# Patient Record
Sex: Female | Born: 1955
Health system: Southern US, Community
[De-identification: ages and names within clinical notes are randomized; demographics above are authoritative.]

## PROBLEM LIST (undated history)

## (undated) DIAGNOSIS — Z972 Presence of dental prosthetic device (complete) (partial): Secondary | ICD-10-CM

## (undated) DIAGNOSIS — F329 Major depressive disorder, single episode, unspecified: Secondary | ICD-10-CM

## (undated) DIAGNOSIS — M1711 Unilateral primary osteoarthritis, right knee: Secondary | ICD-10-CM

## (undated) DIAGNOSIS — F419 Anxiety disorder, unspecified: Secondary | ICD-10-CM

## (undated) DIAGNOSIS — G2581 Restless legs syndrome: Secondary | ICD-10-CM

## (undated) DIAGNOSIS — E785 Hyperlipidemia, unspecified: Secondary | ICD-10-CM

## (undated) DIAGNOSIS — I4891 Unspecified atrial fibrillation: Secondary | ICD-10-CM

## (undated) DIAGNOSIS — I2510 Atherosclerotic heart disease of native coronary artery without angina pectoris: Secondary | ICD-10-CM

## (undated) DIAGNOSIS — I5189 Other ill-defined heart diseases: Secondary | ICD-10-CM

## (undated) DIAGNOSIS — Z9221 Personal history of antineoplastic chemotherapy: Secondary | ICD-10-CM

## (undated) DIAGNOSIS — C320 Malignant neoplasm of glottis: Secondary | ICD-10-CM

## (undated) DIAGNOSIS — R05 Cough: Secondary | ICD-10-CM

## (undated) DIAGNOSIS — C140 Malignant neoplasm of pharynx, unspecified: Secondary | ICD-10-CM

## (undated) DIAGNOSIS — R0600 Dyspnea, unspecified: Secondary | ICD-10-CM

## (undated) DIAGNOSIS — C4490 Unspecified malignant neoplasm of skin, unspecified: Secondary | ICD-10-CM

## (undated) DIAGNOSIS — M1990 Unspecified osteoarthritis, unspecified site: Secondary | ICD-10-CM

## (undated) DIAGNOSIS — J449 Chronic obstructive pulmonary disease, unspecified: Secondary | ICD-10-CM

## (undated) DIAGNOSIS — Z7982 Long term (current) use of aspirin: Secondary | ICD-10-CM

## (undated) DIAGNOSIS — K08109 Complete loss of teeth, unspecified cause, unspecified class: Secondary | ICD-10-CM

## (undated) DIAGNOSIS — Z923 Personal history of irradiation: Secondary | ICD-10-CM

## (undated) DIAGNOSIS — F32A Depression, unspecified: Secondary | ICD-10-CM

## (undated) DIAGNOSIS — E538 Deficiency of other specified B group vitamins: Secondary | ICD-10-CM

## (undated) DIAGNOSIS — Z8719 Personal history of other diseases of the digestive system: Secondary | ICD-10-CM

## (undated) DIAGNOSIS — I700 Atherosclerosis of aorta: Secondary | ICD-10-CM

## (undated) DIAGNOSIS — C50919 Malignant neoplasm of unspecified site of unspecified female breast: Secondary | ICD-10-CM

## (undated) DIAGNOSIS — K219 Gastro-esophageal reflux disease without esophagitis: Secondary | ICD-10-CM

## (undated) DIAGNOSIS — D126 Benign neoplasm of colon, unspecified: Secondary | ICD-10-CM

## (undated) HISTORY — DX: Unspecified malignant neoplasm of skin, unspecified: C44.90

## (undated) HISTORY — DX: Malignant neoplasm of pharynx, unspecified: C14.0

## (undated) HISTORY — DX: Malignant neoplasm of unspecified site of unspecified female breast: C50.919

## (undated) HISTORY — PX: KNEE SURGERY: SHX244

## (undated) HISTORY — PX: PORT-A-CATH REMOVAL: SHX5289

## (undated) HISTORY — PX: SKIN CANCER EXCISION: SHX779

## (undated) HISTORY — DX: Malignant neoplasm of glottis: C32.0

## (undated) HISTORY — PX: TUBAL LIGATION: SHX77

## (undated) HISTORY — PX: ESOPHAGUS SURGERY: SHX626

---
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
## 1995-05-03 DIAGNOSIS — C50919 Malignant neoplasm of unspecified site of unspecified female breast: Secondary | ICD-10-CM

## 1995-05-03 HISTORY — PX: BREAST LUMPECTOMY: SHX2

## 1995-05-03 HISTORY — DX: Malignant neoplasm of unspecified site of unspecified female breast: C50.919

## 1995-05-03 HISTORY — PX: BREAST BIOPSY: SHX20

## 1996-05-02 DIAGNOSIS — C140 Malignant neoplasm of pharynx, unspecified: Secondary | ICD-10-CM

## 1996-05-02 HISTORY — PX: THROAT SURGERY: SHX803

## 1996-05-02 HISTORY — DX: Malignant neoplasm of pharynx, unspecified: C14.0

## 2004-09-16 ENCOUNTER — Ambulatory Visit: Payer: Self-pay | Admitting: Oncology

## 2004-09-30 ENCOUNTER — Ambulatory Visit: Payer: Self-pay | Admitting: Oncology

## 2005-01-10 ENCOUNTER — Other Ambulatory Visit: Payer: Self-pay

## 2005-01-10 ENCOUNTER — Emergency Department: Payer: Self-pay | Admitting: Emergency Medicine

## 2005-10-05 ENCOUNTER — Ambulatory Visit: Payer: Self-pay | Admitting: Oncology

## 2005-10-30 ENCOUNTER — Ambulatory Visit: Payer: Self-pay | Admitting: Oncology

## 2005-11-30 ENCOUNTER — Ambulatory Visit: Payer: Self-pay | Admitting: Oncology

## 2006-10-01 ENCOUNTER — Ambulatory Visit: Payer: Self-pay | Admitting: Oncology

## 2006-10-05 ENCOUNTER — Ambulatory Visit: Payer: Self-pay | Admitting: Oncology

## 2006-10-31 ENCOUNTER — Ambulatory Visit: Payer: Self-pay | Admitting: Oncology

## 2006-11-15 ENCOUNTER — Ambulatory Visit: Payer: Self-pay | Admitting: Oncology

## 2007-06-14 DIAGNOSIS — Z720 Tobacco use: Secondary | ICD-10-CM | POA: Insufficient documentation

## 2007-10-31 ENCOUNTER — Ambulatory Visit: Payer: Self-pay | Admitting: Oncology

## 2007-11-29 ENCOUNTER — Ambulatory Visit: Payer: Self-pay | Admitting: Oncology

## 2007-12-10 ENCOUNTER — Ambulatory Visit: Payer: Self-pay | Admitting: Oncology

## 2008-03-13 DIAGNOSIS — D492 Neoplasm of unspecified behavior of bone, soft tissue, and skin: Secondary | ICD-10-CM | POA: Insufficient documentation

## 2008-10-23 DIAGNOSIS — L03019 Cellulitis of unspecified finger: Secondary | ICD-10-CM | POA: Insufficient documentation

## 2008-10-30 ENCOUNTER — Ambulatory Visit: Payer: Self-pay | Admitting: Oncology

## 2008-11-24 ENCOUNTER — Ambulatory Visit: Payer: Self-pay | Admitting: Oncology

## 2008-11-30 ENCOUNTER — Ambulatory Visit: Payer: Self-pay | Admitting: Oncology

## 2008-12-10 ENCOUNTER — Ambulatory Visit: Payer: Self-pay | Admitting: Oncology

## 2009-10-30 ENCOUNTER — Ambulatory Visit: Payer: Self-pay | Admitting: Oncology

## 2009-11-26 ENCOUNTER — Ambulatory Visit: Payer: Self-pay | Admitting: Oncology

## 2009-12-22 ENCOUNTER — Ambulatory Visit: Payer: Self-pay | Admitting: Oncology

## 2010-04-20 ENCOUNTER — Inpatient Hospital Stay: Payer: Self-pay | Admitting: Orthopedic Surgery

## 2010-08-31 ENCOUNTER — Ambulatory Visit: Payer: Self-pay | Admitting: Orthopedic Surgery

## 2010-11-29 ENCOUNTER — Ambulatory Visit: Payer: Self-pay | Admitting: Oncology

## 2010-12-01 ENCOUNTER — Ambulatory Visit: Payer: Self-pay | Admitting: Oncology

## 2011-01-01 ENCOUNTER — Ambulatory Visit: Payer: Self-pay | Admitting: Oncology

## 2011-11-28 ENCOUNTER — Ambulatory Visit: Payer: Self-pay | Admitting: Oncology

## 2011-11-28 LAB — COMPREHENSIVE METABOLIC PANEL
Alkaline Phosphatase: 99 U/L (ref 50–136)
Anion Gap: 7 (ref 7–16)
Bilirubin,Total: 0.4 mg/dL (ref 0.2–1.0)
Calcium, Total: 9.2 mg/dL (ref 8.5–10.1)
Chloride: 102 mmol/L (ref 98–107)
Co2: 31 mmol/L (ref 21–32)
Creatinine: 0.82 mg/dL (ref 0.60–1.30)
EGFR (Non-African Amer.): >60
Osmolality: 278 (ref 275–301)
Potassium: 4.4 mmol/L (ref 3.5–5.1)
Sodium: 140 mmol/L (ref 136–145)

## 2011-11-28 LAB — CBC CANCER CENTER
Basophil #: 0 x10 3/mm (ref 0.0–0.1)
HCT: 46.3 % (ref 35.0–47.0)
Lymphocyte #: 1 x10 3/mm (ref 1.0–3.6)
MCH: 34.5 pg — ABNORMAL HIGH (ref 26.0–34.0)
MCHC: 33.8 g/dL (ref 32.0–36.0)
MCV: 102 fL — ABNORMAL HIGH (ref 80–100)
Monocyte #: 0.5 x10 3/mm (ref 0.2–0.9)
Monocyte %: 8.1 %
Neutrophil #: 4.1 x10 3/mm (ref 1.4–6.5)
Platelet: 186 x10 3/mm (ref 150–440)
RBC: 4.54 10*6/uL (ref 3.80–5.20)
RDW: 12.2 % (ref 11.5–14.5)
WBC: 5.7 x10 3/mm (ref 3.6–11.0)

## 2011-12-01 ENCOUNTER — Ambulatory Visit: Payer: Self-pay | Admitting: Oncology

## 2012-10-30 ENCOUNTER — Ambulatory Visit: Payer: Self-pay | Admitting: Oncology

## 2012-11-26 LAB — COMPREHENSIVE METABOLIC PANEL
Albumin: 3.5 g/dL (ref 3.4–5.0)
Alkaline Phosphatase: 87 U/L (ref 50–136)
Bilirubin,Total: 0.4 mg/dL (ref 0.2–1.0)
Calcium, Total: 9 mg/dL (ref 8.5–10.1)
Chloride: 106 mmol/L (ref 98–107)
Creatinine: 0.85 mg/dL (ref 0.60–1.30)
EGFR (African American): >60
Osmolality: 280 (ref 275–301)
Potassium: 4.1 mmol/L (ref 3.5–5.1)
SGOT(AST): 25 U/L (ref 15–37)
SGPT (ALT): 21 U/L (ref 12–78)
Total Protein: 6.4 g/dL (ref 6.4–8.2)

## 2012-11-26 LAB — CBC CANCER CENTER
Basophil #: 0 x10 3/mm (ref 0.0–0.1)
Eosinophil #: 0 x10 3/mm (ref 0.0–0.7)
HGB: 15.1 g/dL (ref 12.0–16.0)
Lymphocyte %: 14.3 %
MCH: 35.2 pg — ABNORMAL HIGH (ref 26.0–34.0)
MCHC: 34.7 g/dL (ref 32.0–36.0)
MCV: 102 fL — ABNORMAL HIGH (ref 80–100)
Monocyte #: 0.4 x10 3/mm (ref 0.2–0.9)
Monocyte %: 5.9 %
Neutrophil #: 5.6 x10 3/mm (ref 1.4–6.5)
Platelet: 182 x10 3/mm (ref 150–440)
RBC: 4.29 10*6/uL (ref 3.80–5.20)
RDW: 11.3 % — ABNORMAL LOW (ref 11.5–14.5)
WBC: 7.2 x10 3/mm (ref 3.6–11.0)

## 2012-11-30 ENCOUNTER — Ambulatory Visit: Payer: Self-pay | Admitting: Oncology

## 2013-03-06 DIAGNOSIS — B0052 Herpesviral keratitis: Secondary | ICD-10-CM | POA: Insufficient documentation

## 2013-03-06 DIAGNOSIS — H2510 Age-related nuclear cataract, unspecified eye: Secondary | ICD-10-CM | POA: Insufficient documentation

## 2013-05-02 DIAGNOSIS — C320 Malignant neoplasm of glottis: Secondary | ICD-10-CM

## 2013-05-02 HISTORY — DX: Malignant neoplasm of glottis: C32.0

## 2013-05-02 HISTORY — PX: COLONOSCOPY: SHX174

## 2013-06-12 ENCOUNTER — Emergency Department: Payer: Self-pay | Admitting: Internal Medicine

## 2013-12-31 ENCOUNTER — Ambulatory Visit: Payer: Self-pay | Admitting: Oncology

## 2014-02-03 ENCOUNTER — Ambulatory Visit: Payer: Self-pay | Admitting: Oncology

## 2014-03-02 ENCOUNTER — Ambulatory Visit: Payer: Self-pay | Admitting: Oncology

## 2014-04-07 ENCOUNTER — Ambulatory Visit: Payer: Self-pay | Admitting: Gastroenterology

## 2014-04-07 LAB — HM COLONOSCOPY

## 2014-04-08 LAB — CBC WITH DIFFERENTIAL/PLATELET
Basophil #: 0 x10 3/mm 3
Basophil %: 0.2 %
Eosinophil #: 0 x10 3/mm 3
Eosinophil %: 0.2 %
HCT: 40.6 %
HGB: 13.5 g/dL
Lymphocyte %: 10.7 %
Lymphs Abs: 0.8 x10 3/mm 3 — ABNORMAL LOW
MCH: 33.2 pg
MCHC: 33.4 g/dL
MCV: 100 fL
Monocyte #: 0.7 "x10 3/mm "
Monocyte %: 8.8 %
Neutrophil #: 6.1 x10 3/mm 3
Neutrophil %: 80.1 %
Platelet: 169 x10 3/mm 3
RBC: 4.08 X10 6/mm 3
RDW: 12.7 %
WBC: 7.6 x10 3/mm 3

## 2014-04-08 LAB — BASIC METABOLIC PANEL WITH GFR
Anion Gap: 4 — ABNORMAL LOW
BUN: 6 mg/dL — ABNORMAL LOW
Calcium, Total: 7.5 mg/dL — ABNORMAL LOW
Chloride: 113 mmol/L — ABNORMAL HIGH
Co2: 26 mmol/L
Creatinine: 0.67 mg/dL
EGFR (African American): >60
EGFR (Non-African Amer.): >60
Glucose: 84 mg/dL
Osmolality: 282
Potassium: 3.5 mmol/L
Sodium: 143 mmol/L

## 2014-04-16 ENCOUNTER — Ambulatory Visit: Payer: Self-pay | Admitting: Gastroenterology

## 2014-04-21 ENCOUNTER — Ambulatory Visit: Payer: Self-pay | Admitting: Oncology

## 2014-04-29 ENCOUNTER — Ambulatory Visit: Payer: Self-pay | Admitting: Gastroenterology

## 2014-05-01 ENCOUNTER — Ambulatory Visit: Payer: Self-pay | Admitting: Oncology

## 2014-05-02 ENCOUNTER — Ambulatory Visit: Payer: Self-pay | Admitting: Oncology

## 2014-06-02 ENCOUNTER — Ambulatory Visit: Payer: Self-pay | Admitting: Oncology

## 2014-08-15 ENCOUNTER — Other Ambulatory Visit: Payer: Self-pay | Admitting: Oncology

## 2014-08-15 DIAGNOSIS — J984 Other disorders of lung: Secondary | ICD-10-CM

## 2014-08-15 DIAGNOSIS — Z853 Personal history of malignant neoplasm of breast: Secondary | ICD-10-CM

## 2014-08-23 NOTE — Discharge Summary (Signed)
PATIENT NAME:  Denise Watts, Denise Watts I MR#:  [id number] DATE OF BIRTH:  01/12/1956  PRIMARY CARE PHYSICIAN:  Nonlocal.  DISCHARGE DIAGNOSES: 1.  Severe odynophagia, status post an esophagogastroduodenoscopy with esophagitis.  2.  Accelerated hypertension.  3.  A 1.4 centimeter left lower lobe pulmonary nodule.  4.  Gastroesophageal reflux disease. 5.  Vocal cord cancer. 6.  Breast cancer.   CONDITION:  Stable.   CODE STATUS:  Full code.   HOME MEDICATIONS: Please refer to the medication reconciliation list. Protonix 40 mg p.o. b.i.d., Percocet 325 mg-5 mg p.o. every 6 hours p.r.n. for 5 days.  DIET:  Full liquid diet, then advance to regular diet if tolerated.   FOLLOWUP CARE: With PCP and Dr. Candace Cruise within 1-2 weeks. Followup chest CT within 3-6 months for pulmonary nodule.   REASON FOR ADMISSION: Difficulty swallowing and painful.   HOSPITAL COURSE:  1.  The patient is a 59 year old Caucasian female with a history of vocal cord cancer, breast cancer, underwent EGD and colonoscopy yesterday by Dr. Candace Cruise. The patient developed dysphagia and pain after endoscopy. So, the patient admitted. The patient was placed for observation yesterday. The patient still has throat pain and substernal pain.  Dr. Candace Cruise suggested a CAT scan of the neck and chest to rule out perforation. CAT scan of neck and chest did not show any perforation, but showed some esophagitis. Dr. Candace Cruise suggested to start clear liquid and PPI b.i.d. with pain control. The patient tolerated clear liquids just now.  2.  Accelerated hypertension. The patient has no history of hypertension, but blood pressure was high at 181/111 after endoscopy yesterday, but the patient's blood pressure improved to normal range. No need of hypertension medication.  3.  Per CAT scan of chest, the patient has a pulmonary nodule 1.4 cm on the left lower lobe. Patient needs a followup CAT scan of chest within 3-6 months.   The patient is clinically stable. She is going to be  discharged to home today. I discussed the patient's discharge plan with the patient, the patient's husband, nurse, case manager, and Dr. Candace Cruise.   TIME SPENT: About 67 minutes.    ____________________________ Demetrios Loll, MD qc:LT D: 04/08/2014 15:03:33 ET T: 04/08/2014 20:54:54 ET JOB#: [id number]  cc: Demetrios Loll, MD, <Dictator> Demetrios Loll MD ELECTRONICALLY SIGNED 04/09/2014 15:20

## 2014-08-23 NOTE — Consult Note (Signed)
Pt still with sore throat. CT done earlier. Just got preliminary report. Some nonspecific inflammation of esophagus. Pulmonary nodule seen, which needs to be followed closely. No evidence of perforation, etc. Would start patient on clears. Make sure patient is on PPI bid for now.  Pain meds +/or GI cocktail may help with pain control Pt can f/u with me in office later. Thanks.  Electronic Signatures: Verdie Shire (MD)  (Signed on 08-Dec-15 12:34)  Authored  Last Updated: 08-Dec-15 12:34 by Verdie Shire (MD)

## 2014-08-23 NOTE — H&P (Signed)
PATIENT NAME:  Denise Watts, ECKFORD I MR#:  [id number] DATE OF BIRTH:  01-Apr-1956  DATE OF ADMISSION:  04/07/2014  PRIMARY CARE PHYSICIAN/ONCOLOGY:  Dr. Forest Gleason.   REQUESTING PHYSICIAN: Dr. Verdie Shire.   CHIEF COMPLAINT:  Difficulty swallowing, also painful.    HISTORY OF PRESENT ILLNESS:  The patient is a 59 year old female with known history of breast cancer, vocal cord cancer, undergoing radiation at the [facility], was undergoing EGD and colonoscopy by Dr. Candace Cruise this morning and she was having difficulty swallowing. Her esophagus was dilated by Dr. Candace Cruise. Post procedure she started having a fair amount of severe throat pain and was unable to swallow her sputum, due to which we have been asked to monitor her in the hospital. She is reporting about 8 out of 10 pain in her throat, unable to swallow sputum or saliva. She is also complaining of some mid sternal pain. Denies any epigastric pain and also having some headache. She reports her pain getting worse since her procedure.   PAST MEDICAL HISTORY:  1.  GERD.  2.  Basal cell skin cancer.  3.  DJD.   4.  History of migraine.  5.  Vocal cord cancer.  6.  Breast cancer.   PAST SURGICAL HISTORY:  1.  Esophageal surgery.   2.  Right breast lumpectomy.  3.  Throat surgery.  4.  Tubal ligation.  5.  ORIF of right tibia.   ALLERGIES: No known drug allergies.   SOCIAL HISTORY: No smoking. Occasional alcohol. No IV drugs of abuse.   FAMILY HISTORY: Positive for lung cancer in her sister.   MEDICATIONS AT HOME:  1.  Nexium 40 mg p.o. daily.  2.  Aleve 220 mg p.o. daily as needed.   REVIEW OF SYSTEMS:    CONSTITUTIONAL: No fever, fatigue, weakness.  EYES: No blurred or double vision.  EARS, NOSE, AND THROAT: No tinnitus or ear pain.  RESPIRATORY: No cough, wheezing, hemoptysis.  CARDIOVASCULAR: No chest pain, orthopnea, edema.  GASTROINTESTINAL: No nausea, vomiting, diarrhea. She does have severe odynophagia and some midsternal heaviness.   GENITOURINARY: No dysuria or hematuria.  ENDOCRINE: No polyuria or nocturia.  HEMATOLOGY: No anemia or easy bruising. She does have a history of vocal cord cancer along with breast cancer.  MUSCULOSKELETAL: No joint effusion or tenderness.  NEUROLOGIC: No tingling, numbness, weakness.  PSYCHIATRIC: No history of anxiety or depression.  SKIN: No obvious rash.   PHYSICAL EXAMINATION:  VITAL SIGNS: Temperature 97.6, heart rate 73 per minute, respirations 18 per minute, blood pressure 181/111. She is saturating 99% on room air.  GENERAL: The patient is a 59 year old female lying in the bed comfortably without any acute distress.  EYES: Pupils equal, round, reactive to light, there is no scleral icterus. Extraocular muscles intact.  HEENT: Head atraumatic, normocephalic. Oropharynx and nasopharynx clear. Her oral cavity exam looks fairly normal, she does have erythema in the back of her throat.  NECK: Supple.  No jugular venous distention.  No thyroid enlargement or tenderness. LUNGS: Clear to auscultation bilaterally. No wheezing, rales, rhonchi, or crepitation.  CARDIOVASCULAR: S1, S2 normal. No murmurs, rubs, or gallop.  ABDOMEN: Soft, nontender, nondistended. Bowel sounds present. No organomegaly or mass.  EXTREMITIES: No pedal edema, cyanosis, clubbing.  NEUROLOGIC: Cranial nerves II through XII are intact. Muscle strength 5 out of 5 in extremities. Sensation intact.  PSYCHIATRIC: The patient is alert and oriented x 3.  SKIN: No obvious rash, lesion, or ulcer.  MUSCULOSKELETAL: No joint effusion or  tenderness.   LABORATORY PANEL: None.   IMPRESSION AND PLAN:  1.  Severe odynphagia status post esophagogastroduodenoscopy, this could be procedure related complication. We will closely monitor her, keep her n.p.o. for now, start on IV hydration, consult GI. We will provide morphine for pain control as she is not able to keep anything down by mouth.  2.  Accelerated hypertension with a blood  pressure 181/111. We will start her on IV hydralazine for now and monitor her blood pressure closely. This is likely due to her uncontrolled pain. 3.  Gastroesophageal reflux disease. We will start IV Protonix at this time.  4.  Code status: Full code.   TOTAL TIME TAKING CARE OF THIS PATIENT: 40 minutes.   Dr. Candace Cruise from GI has been consulted, he will be seeing her later today.      ____________________________ Tapanga Ottaway S. Manuella Ghazi, MD vss:bu D: 04/07/2014 14:42:16 ET T: 04/07/2014 15:04:55 ET JOB#: [id number]  cc: Wiley Magan S. Manuella Ghazi, MD, <Dictator> Martie Lee. Oliva Bustard, MD Lupita Dawn. Candace Cruise, MD Remer Macho MD ELECTRONICALLY SIGNED 04/11/2014 13:08

## 2014-08-23 NOTE — Consult Note (Signed)
Pt underwent EGD earlier with esophageal dilation with 54 Fr Maloney dilator. Started having fair amount of throat pain afterwards. I know there was some bleeding and resistance with passage of dilator. Suspect some stenosis at cervical esophageal area from prior XRT to vocal cord area for hx of vocal cord cancer in the past. Pt given fentanyl without significant relief. Pt looks stable otherwise. Discussed with hospitalist for observation. Keep patient NPO with IVF. If pain persists, then order CT of neck area or barium swallow to check for possible perforation from dilation and get Dr. Genevive Bi involved.    Electronic Signatures: Verdie Shire (MD) (Signed on 07-Dec-15 12:48)  Authored   Last Updated: 07-Dec-15 16:22 by Verdie Shire (MD)

## 2014-11-05 ENCOUNTER — Other Ambulatory Visit: Payer: Self-pay | Admitting: *Deleted

## 2014-11-05 DIAGNOSIS — R911 Solitary pulmonary nodule: Secondary | ICD-10-CM

## 2014-11-06 ENCOUNTER — Ambulatory Visit: Payer: No Typology Code available for payment source

## 2014-11-06 ENCOUNTER — Other Ambulatory Visit: Payer: Self-pay | Admitting: Family Medicine

## 2014-11-07 ENCOUNTER — Ambulatory Visit
Admission: RE | Admit: 2014-11-07 | Discharge: 2014-11-07 | Disposition: A | Discharge status: Home / Self Care | Payer: No Typology Code available for payment source | Source: Ambulatory Visit | Attending: Oncology | Admitting: Oncology

## 2014-11-07 ENCOUNTER — Other Ambulatory Visit: Payer: Self-pay | Admitting: *Deleted

## 2014-11-07 DIAGNOSIS — R911 Solitary pulmonary nodule: Secondary | ICD-10-CM | POA: Diagnosis present

## 2014-11-07 DIAGNOSIS — Z85819 Personal history of malignant neoplasm of unspecified site of lip, oral cavity, and pharynx: Secondary | ICD-10-CM | POA: Insufficient documentation

## 2014-11-07 DIAGNOSIS — Z923 Personal history of irradiation: Secondary | ICD-10-CM | POA: Diagnosis not present

## 2014-11-07 DIAGNOSIS — I2510 Atherosclerotic heart disease of native coronary artery without angina pectoris: Secondary | ICD-10-CM | POA: Diagnosis not present

## 2014-11-07 DIAGNOSIS — C50919 Malignant neoplasm of unspecified site of unspecified female breast: Secondary | ICD-10-CM

## 2014-11-07 DIAGNOSIS — Z853 Personal history of malignant neoplasm of breast: Secondary | ICD-10-CM | POA: Diagnosis not present

## 2014-11-10 ENCOUNTER — Inpatient Hospital Stay: Payer: PRIVATE HEALTH INSURANCE | Attending: Oncology

## 2014-11-10 ENCOUNTER — Inpatient Hospital Stay (HOSPITAL_BASED_OUTPATIENT_CLINIC_OR_DEPARTMENT_OTHER): Payer: PRIVATE HEALTH INSURANCE | Admitting: Oncology

## 2014-11-10 ENCOUNTER — Encounter: Payer: Self-pay | Admitting: Oncology

## 2014-11-10 VITALS — BP 131/80 | HR 75 | Temp 98.8°F | Resp 18 | Ht 61.0 in | Wt 146.4 lb

## 2014-11-10 DIAGNOSIS — Z85828 Personal history of other malignant neoplasm of skin: Secondary | ICD-10-CM | POA: Diagnosis not present

## 2014-11-10 DIAGNOSIS — D709 Neutropenia, unspecified: Secondary | ICD-10-CM | POA: Insufficient documentation

## 2014-11-10 DIAGNOSIS — Z87891 Personal history of nicotine dependence: Secondary | ICD-10-CM | POA: Insufficient documentation

## 2014-11-10 DIAGNOSIS — R911 Solitary pulmonary nodule: Secondary | ICD-10-CM | POA: Insufficient documentation

## 2014-11-10 DIAGNOSIS — Z853 Personal history of malignant neoplasm of breast: Secondary | ICD-10-CM | POA: Insufficient documentation

## 2014-11-10 DIAGNOSIS — C320 Malignant neoplasm of glottis: Secondary | ICD-10-CM | POA: Insufficient documentation

## 2014-11-10 DIAGNOSIS — I2510 Atherosclerotic heart disease of native coronary artery without angina pectoris: Secondary | ICD-10-CM | POA: Insufficient documentation

## 2014-11-10 DIAGNOSIS — Z79899 Other long term (current) drug therapy: Secondary | ICD-10-CM | POA: Insufficient documentation

## 2014-11-10 DIAGNOSIS — Z923 Personal history of irradiation: Secondary | ICD-10-CM | POA: Diagnosis not present

## 2014-11-10 DIAGNOSIS — C50919 Malignant neoplasm of unspecified site of unspecified female breast: Secondary | ICD-10-CM

## 2014-11-10 HISTORY — DX: Malignant neoplasm of glottis: C32.0

## 2014-11-10 LAB — CBC WITH DIFFERENTIAL/PLATELET
BASOS PCT: 1 %
Basophils Absolute: 0 10*3/uL (ref 0–0.1)
EOS PCT: 3 %
Eosinophils Absolute: 0.1 10*3/uL (ref 0–0.7)
HEMATOCRIT: 43.9 % (ref 35.0–47.0)
Hemoglobin: 14.4 g/dL (ref 12.0–16.0)
LYMPHS ABS: 0.4 10*3/uL — AB (ref 1.0–3.6)
LYMPHS PCT: 20 %
MCH: 32.8 pg (ref 26.0–34.0)
MCHC: 32.9 g/dL (ref 32.0–36.0)
MCV: 99.6 fL (ref 80.0–100.0)
Monocytes Absolute: 0.2 10*3/uL (ref 0.2–0.9)
Monocytes Relative: 9 %
Neutro Abs: 1.4 10*3/uL (ref 1.4–6.5)
Neutrophils Relative %: 67 %
Platelets: 185 10*3/uL (ref 150–440)
RBC: 4.4 MIL/uL (ref 3.80–5.20)
RDW: 12.4 % (ref 11.5–14.5)
WBC: 2.1 10*3/uL — ABNORMAL LOW (ref 3.6–11.0)

## 2014-11-10 LAB — COMPREHENSIVE METABOLIC PANEL
ALBUMIN: 3.9 g/dL (ref 3.5–5.0)
ALT: 19 U/L (ref 14–54)
ANION GAP: 7 (ref 5–15)
AST: 30 U/L (ref 15–41)
Alkaline Phosphatase: 71 U/L (ref 38–126)
BUN: 12 mg/dL (ref 6–20)
CO2: 25 mmol/L (ref 22–32)
Calcium: 8.3 mg/dL — ABNORMAL LOW (ref 8.9–10.3)
Chloride: 103 mmol/L (ref 101–111)
Creatinine, Ser: 0.88 mg/dL (ref 0.44–1.00)
GFR calc Af Amer: >60 mL/min (ref >60)
GFR calc non Af Amer: >60 mL/min (ref >60)
Glucose, Bld: 102 mg/dL — ABNORMAL HIGH (ref 65–99)
POTASSIUM: 3.7 mmol/L (ref 3.5–5.1)
Sodium: 135 mmol/L (ref 135–145)
Total Bilirubin: 0.5 mg/dL (ref 0.3–1.2)
Total Protein: 6.4 g/dL — ABNORMAL LOW (ref 6.5–8.1)

## 2014-11-10 NOTE — Progress Notes (Signed)
[place] @ [facility] [phone]  Fax:([phone] [place]: March 01, 1956  MR#: [id number]  DJM#:[id number]  Patient Care Team: Margo Common, PA as PCP - General (Physician Assistant)  CHIEF COMPLAINT:  Chief Complaint  Patient presents with  . Follow-up    Pulmonary nodule    Oncology History   1.carcinoma of breast, T1, N0, M0 tumor diagnosis.  In January of 1997 2.  Carcinoma of the vocal cord T1, N0, M0 tumor.  Status postradiation therapy 3,abnormal CT scan of the chest (December, 2015) 4.  Repeat CT scan of chest shows stable nodule (July, 2016) 5.  Neutropenia with normal hemoglobin and normal platelet count (July, 2016)     Cancer of vocal cord   11/10/2014 Initial Diagnosis Cancer of vocal cord     INTERVAL HISTORY:  59 year old lady with a previous history of carcinoma breast as well as carcinoma focal cord had pulmonary nodule detected in CT scan of 2015 had a repeat CT scan.  No chills.  No fever.  No cough or shortness of breath.  Patient has stopped smoking. NO hemoptysis or chest pain Getting regular mammogram done REVIEW OF SYSTEMS:   GENERAL:  Feels good.  Active.  No fevers, sweats or weight loss. PERFORMANCE STATUS (ECOG):  0 HEENT:  No visual changes, runny nose, sore throat, mouth sores or tenderness. Lungs: No shortness of breath or cough.  No hemoptysis. Cardiac:  No chest pain, palpitations, orthopnea, or PND. GI:  No nausea, vomiting, diarrhea, constipation, melena or hematochezia. GU:  No urgency, frequency, dysuria, or hematuria. Musculoskeletal:  No back pain.  No joint pain.  No muscle tenderness. Extremities:  No pain or swelling. Skin:  No rashes or skin changes. Neuro:  No headache, numbness or weakness, balance or coordination issues. Endocrine:  No diabetes, thyroid issues, hot flashes or night sweats. Psych:  No mood changes, depression or anxiety. Pain:  No focal pain. Review of systems:  All other systems  reviewed and found to be negative. As per HPI. Otherwise, a complete review of systems is negatve.  PAST MEDICAL HISTORY: Past Medical History  Diagnosis Date  . Breast cancer   . Throat cancer   . Skin cancer   . Cancer of vocal cord 11/10/2014    PAST SURGICAL HISTORY: Past Surgical History  Procedure Laterality Date  . Skin cancer excision      FAMILY HISTORY Family History  Problem Relation Age of Onset  . Diabetes Mother   . Lung cancer Sister     ADVANCED DIRECTIVES:  Patient does not have any living will or healthcare power of attorney.  Information was given .  Available resources had been discussed.  We will follow-up on subsequent appointments regarding this issue HEALTH MAINTENANCE: History  Substance Use Topics  . Smoking status: Former Research scientist (life sciences)  . Smokeless tobacco: Not on file  . Alcohol Use: Not on file      No Known Allergies  Current Outpatient Prescriptions  Medication Sig Dispense Refill  . omeprazole (PRILOSEC) 40 MG capsule TAKE ONE (1) CAPSULE EACH DAY 30 capsule 3   No current facility-administered medications for this visit.    OBJECTIVE:  Filed Vitals:   11/10/14 1458  BP: 131/80  Pulse: 75  Temp: 98.8 F (37.1 C)  Resp: 18     Body mass index is 27.67 kg/(m^2).    ECOG FS:0 - Asymptomatic  PHYSICAL EXAM: GENERAL:  Well developed, well nourished, sitting comfortably in  the exam room in no acute distress. MENTAL STATUS:  Alert and oriented to person, place and time. HEAD: Normocephalic, atraumatic, face symmetric, no Cushingoid features. EYES:  .  Pupils equal round and reactive to light and accomodation.  No conjunctivitis or scleral icterus. ENT:  Oropharynx clear without lesion.  Tongue normal. Mucous membranes moist.  RESPIRATORY:  Clear to auscultation without rales, wheezes or rhonchi. CARDIOVASCULAR:  Regular rate and rhythm without murmur, rub or gallop.  ABDOMEN:  Soft, non-tender, with active bowel sounds, and no  hepatosplenomegaly.  No masses. BACK:  No CVA tenderness.  No tenderness on percussion of the back or rib cage. SKIN:  No rashes, ulcers or lesions. EXTREMITIES: No edema, no skin discoloration or tenderness.  No palpable cords. LYMPH NODES: No palpable cervical, supraclavicular, axillary or inguinal adenopathy  NEUROLOGICAL: Unremarkable. PSYCH:  Appropriate.   LAB RESULTS:  Appointment on 11/10/2014  Component Date Value Ref Range Status  . WBC 11/10/2014 2.1* 3.6 - 11.0 K/uL Final  . RBC 11/10/2014 4.40  3.80 - 5.20 MIL/uL Final  . Hemoglobin 11/10/2014 14.4  12.0 - 16.0 g/dL Final  . HCT 11/10/2014 43.9  35.0 - 47.0 % Final  . MCV 11/10/2014 99.6  80.0 - 100.0 fL Final  . MCH 11/10/2014 32.8  26.0 - 34.0 pg Final  . MCHC 11/10/2014 32.9  32.0 - 36.0 g/dL Final  . RDW 11/10/2014 12.4  11.5 - 14.5 % Final  . Platelets 11/10/2014 185  150 - 440 K/uL Final  . Neutrophils Relative % 11/10/2014 67   Final  . Neutro Abs 11/10/2014 1.4  1.4 - 6.5 K/uL Final  . Lymphocytes Relative 11/10/2014 20   Final  . Lymphs Abs 11/10/2014 0.4* 1.0 - 3.6 K/uL Final  . Monocytes Relative 11/10/2014 9   Final  . Monocytes Absolute 11/10/2014 0.2  0.2 - 0.9 K/uL Final  . Eosinophils Relative 11/10/2014 3   Final  . Eosinophils Absolute 11/10/2014 0.1  0 - 0.7 K/uL Final  . Basophils Relative 11/10/2014 1   Final  . Basophils Absolute 11/10/2014 0.0  0 - 0.1 K/uL Final  . Sodium 11/10/2014 135  135 - 145 mmol/L Final  . Potassium 11/10/2014 3.7  3.5 - 5.1 mmol/L Final  . Chloride 11/10/2014 103  101 - 111 mmol/L Final  . CO2 11/10/2014 25  22 - 32 mmol/L Final  . Glucose, Bld 11/10/2014 102* 65 - 99 mg/dL Final  . BUN 11/10/2014 12  6 - 20 mg/dL Final  . Creatinine, Ser 11/10/2014 0.88  0.44 - 1.00 mg/dL Final  . Calcium 11/10/2014 8.3* 8.9 - 10.3 mg/dL Final  . Total Protein 11/10/2014 6.4* 6.5 - 8.1 g/dL Final  . Albumin 11/10/2014 3.9  3.5 - 5.0 g/dL Final  . AST 11/10/2014 30  15 - 41 U/L  Final  . ALT 11/10/2014 19  14 - 54 U/L Final  . Alkaline Phosphatase 11/10/2014 71  38 - 126 U/L Final  . Total Bilirubin 11/10/2014 0.5  0.3 - 1.2 mg/dL Final  . GFR calc non Af Amer 11/10/2014 >60  >60 mL/min Final  . GFR calc Af Amer 11/10/2014 >60  >60 mL/min Final   Comment: (NOTE) The eGFR has been calculated using the CKD EPI equation. This calculation has not been validated in all clinical situations. eGFR's persistently <60 mL/min signify possible Chronic Kidney Disease.   . Anion gap 11/10/2014 7  5 - 15 Final     STUDIES: Ct Chest Wo Contrast  11/07/2014   CLINICAL DATA:  59 year old female with history of right-sided breast cancer status post lumpectomy and radiation therapy. Additional history of throat cancer status post surgical resection and radiation therapy.  EXAM: CT CHEST WITHOUT CONTRAST  TECHNIQUE: Multidetector CT imaging of the chest was performed following the standard protocol without IV contrast.  COMPARISON:  Chest CT 04/08/2014.  FINDINGS: Mediastinum/Lymph Nodes: Heart size is normal. There is no significant pericardial fluid, thickening or pericardial calcification. There is atherosclerosis of the thoracic aorta, the great vessels of the mediastinum and the coronary arteries, including calcified atherosclerotic plaque in the left main, ramus intermedius and right coronary arteries. No pathologically enlarged mediastinal or hilar lymph nodes. Please note that accurate exclusion of hilar adenopathy is limited on noncontrast CT scans. Esophagus is unremarkable in appearance. No axillary lymphadenopathy.  Lungs/Pleura: 13 x 9 mm pleural-based macrolobulated nodule in the left lower lobe is unchanged compared to prior study 04/08/2014. A few patchy areas of ground-glass attenuation nodularity are noted in the lungs bilaterally, generally similar to the prior examination and nonspecific. The one exception is a new pleural-based area of ground-glass attenuation measuring 8  mm in the periphery of the left upper lobe (image 26 of series 4), which is nonspecific. No acute consolidative airspace disease. No pleural effusions.  Upper Abdomen: Unremarkable.  Musculoskeletal/Soft Tissues: There are no aggressive appearing lytic or blastic lesions noted in the visualized portions of the skeleton.  IMPRESSION: 1. No significant change in 13 x 9 mm pleural-based macrolobulated nodule in the superior segment of the left lower lobe. Given the stability compared to prior examinations, and a lack of hypermetabolism on the PET CT from 05/01/2014, this is favored to be benign, potentially a hamartoma. Repeat noncontrast chest CT is recommended in 12 months at this time. 2. Additionally, there are other nonspecific slightly nodular appearing areas of ground-glass attenuation in the lungs bilaterally, the majority of which are stable compared to prior studies (with 1 exception in the periphery of the left upper lobe). These are nonspecific, and warrant attention at the time of repeat chest CT in 1 year. 3. Atherosclerosis, including left main and 2 vessel coronary artery disease. Please note that although the presence of coronary artery calcium documents the presence of coronary artery disease, the severity of this disease and any potential stenosis cannot be assessed on this non-gated CT examination. Assessment for potential risk factor modification, dietary therapy or pharmacologic therapy may be warranted, if clinically indicated.   Electronically Signed   By: Vinnie Langton M.D.   On: 11/07/2014 16:39    ASSESSMENT: Carcinoma of breast getting regular mammogram Carcinoma of the vocal cord Lung nodule.  Repeat CT scan shows stable lung nodule.  Yearly CT scan has been recommended Patient has been a former smoker  MEDICAL DECISION MAKING:  CT scan has been reviewed independently.  All lab data has been reviewed there is neutropenia exact etiology is not clear Will repeat CBC in 1 month  and if it continues to be low we will get vitamin K59 folic acid and other thyroid function assessment If hemoglobin or platelet count changes than bone marrow aspiration and biopsy would be planned  Patient expressed understanding and was in agreement with this plan. She also understands that She can call clinic at any time with any questions, concerns, or complaints.    No matching staging information was found for the patient.  Forest Gleason, MD   11/10/2014 3:41 PM

## 2014-12-08 ENCOUNTER — Inpatient Hospital Stay: Payer: PRIVATE HEALTH INSURANCE | Attending: Oncology

## 2014-12-08 ENCOUNTER — Encounter: Payer: Self-pay | Admitting: Oncology

## 2014-12-08 ENCOUNTER — Inpatient Hospital Stay (HOSPITAL_BASED_OUTPATIENT_CLINIC_OR_DEPARTMENT_OTHER): Payer: PRIVATE HEALTH INSURANCE | Admitting: Oncology

## 2014-12-08 VITALS — BP 116/79 | HR 71 | Temp 97.8°F | Wt 144.4 lb

## 2014-12-08 DIAGNOSIS — Z923 Personal history of irradiation: Secondary | ICD-10-CM | POA: Insufficient documentation

## 2014-12-08 DIAGNOSIS — Z87891 Personal history of nicotine dependence: Secondary | ICD-10-CM | POA: Insufficient documentation

## 2014-12-08 DIAGNOSIS — I2510 Atherosclerotic heart disease of native coronary artery without angina pectoris: Secondary | ICD-10-CM

## 2014-12-08 DIAGNOSIS — Z853 Personal history of malignant neoplasm of breast: Secondary | ICD-10-CM | POA: Insufficient documentation

## 2014-12-08 DIAGNOSIS — D709 Neutropenia, unspecified: Secondary | ICD-10-CM

## 2014-12-08 DIAGNOSIS — Z79899 Other long term (current) drug therapy: Secondary | ICD-10-CM | POA: Insufficient documentation

## 2014-12-08 DIAGNOSIS — C320 Malignant neoplasm of glottis: Secondary | ICD-10-CM | POA: Insufficient documentation

## 2014-12-08 DIAGNOSIS — R911 Solitary pulmonary nodule: Secondary | ICD-10-CM

## 2014-12-08 DIAGNOSIS — Z801 Family history of malignant neoplasm of trachea, bronchus and lung: Secondary | ICD-10-CM | POA: Insufficient documentation

## 2014-12-08 DIAGNOSIS — Z85828 Personal history of other malignant neoplasm of skin: Secondary | ICD-10-CM | POA: Insufficient documentation

## 2014-12-08 LAB — CBC WITH DIFFERENTIAL/PLATELET
BASOS ABS: 0 10*3/uL (ref 0–0.1)
Basophils Relative: 1 %
Eosinophils Absolute: 0.1 10*3/uL (ref 0–0.7)
Eosinophils Relative: 2 %
HCT: 43.9 % (ref 35.0–47.0)
HEMOGLOBIN: 14.8 g/dL (ref 12.0–16.0)
Lymphocytes Relative: 23 %
Lymphs Abs: 1.1 10*3/uL (ref 1.0–3.6)
MCH: 32.9 pg (ref 26.0–34.0)
MCHC: 33.8 g/dL (ref 32.0–36.0)
MCV: 97.4 fL (ref 80.0–100.0)
Monocytes Absolute: 0.4 10*3/uL (ref 0.2–0.9)
Monocytes Relative: 8 %
Neutro Abs: 3 10*3/uL (ref 1.4–6.5)
Neutrophils Relative %: 66 %
PLATELETS: 193 10*3/uL (ref 150–440)
RBC: 4.51 MIL/uL (ref 3.80–5.20)
RDW: 11.9 % (ref 11.5–14.5)
WBC: 4.6 10*3/uL (ref 3.6–11.0)

## 2014-12-08 LAB — FOLATE: Folate: 29 ng/mL (ref >5.9)

## 2014-12-08 LAB — VITAMIN B12: Vitamin B-12: 93 pg/mL — ABNORMAL LOW (ref 180–914)

## 2014-12-08 NOTE — Progress Notes (Signed)
Patient does have living will.  Former smoker. 

## 2014-12-09 ENCOUNTER — Telehealth: Payer: Self-pay | Admitting: *Deleted

## 2014-12-09 ENCOUNTER — Other Ambulatory Visit: Payer: Self-pay | Admitting: *Deleted

## 2014-12-09 DIAGNOSIS — E538 Deficiency of other specified B group vitamins: Secondary | ICD-10-CM

## 2014-12-09 MED ORDER — CYANOCOBALAMIN 1000 MCG/ML IJ SOLN: 1000.0000 ug | INTRAMUSCULAR | Status: DC

## 2014-12-09 MED ORDER — NEEDLES & SYRINGES MISC: Status: DC

## 2014-12-09 NOTE — Telephone Encounter (Signed)
Left message for patient that MD wants to start her on Vitamin B-12 injections.  She will have the 1st one here at the clinic and will be taught how to give them to herself at home.  A prescription will be called to her pharmacist but not before the 1st injection at the clinic.  Also informed her that MD wants her to start on a multivitamin once a day.  If she has questions she can reach me.  Left phone number.

## 2014-12-09 NOTE — Addendum Note (Signed)
Addended by: Telford Nab on: 12/09/2014 02:14 PM   Modules accepted: Orders

## 2014-12-10 ENCOUNTER — Other Ambulatory Visit: Payer: Self-pay | Admitting: *Deleted

## 2014-12-10 DIAGNOSIS — E538 Deficiency of other specified B group vitamins: Secondary | ICD-10-CM

## 2014-12-11 ENCOUNTER — Inpatient Hospital Stay: Payer: PRIVATE HEALTH INSURANCE

## 2014-12-11 DIAGNOSIS — C320 Malignant neoplasm of glottis: Secondary | ICD-10-CM | POA: Diagnosis not present

## 2014-12-11 DIAGNOSIS — E538 Deficiency of other specified B group vitamins: Secondary | ICD-10-CM

## 2014-12-11 MED ORDER — CYANOCOBALAMIN 1000 MCG/ML IJ SOLN
1000.0000 ug | Freq: Once | INTRAMUSCULAR | Status: AC
  Administered 2014-12-11: 1000 ug via INTRAMUSCULAR
  Filled 2014-12-11: qty 1

## 2014-12-13 ENCOUNTER — Encounter: Payer: Self-pay | Admitting: Oncology

## 2014-12-13 NOTE — Progress Notes (Signed)
[place] @ [facility] Telephone:([phone]) [phone]  Fax:([phone]) [place] OB: 1955/05/17  MR#: [id number]  GLO#:[id number]  Patient Care Team: Margo Common, PA as PCP - General (Physician Assistant)  CHIEF COMPLAINT:  Chief Complaint  Patient presents with  . Follow-up    Oncology History   1.carcinoma of breast, T1, N0, M0 tumor diagnosis.  In January of 1997 2.  Carcinoma of the vocal cord T1, N0, M0 tumor.  Status postradiation therapy 3,abnormal CT scan of the chest (December, 2015) 4.  Repeat CT scan of chest shows stable nodule (July, 2016) 5.  Neutropenia with normal hemoglobin and normal platelet count (July, 2016)     Cancer of vocal cord   11/10/2014 Initial Diagnosis Cancer of vocal cord     INTERVAL HISTORY:  59 year old lady with a previous history of carcinoma breast as well as carcinoma focal cord had pulmonary nodule detected in CT scan of 2015 had a repeat CT scan.  No chills.  No fever.  No cough or shortness of breath.  Patient has stopped smoking. NO hemoptysis or chest pain Getting regular mammogram done REVIEW OF SYSTEMS:   GENERAL:  Feels good.  Active.  No fevers, sweats or weight loss. PERFORMANCE STATUS (ECOG):  0 HEENT:  No visual changes, runny nose, sore throat, mouth sores or tenderness. Lungs: No shortness of breath or cough.  No hemoptysis. Cardiac:  No chest pain, palpitations, orthopnea, or PND. GI:  No nausea, vomiting, diarrhea, constipation, melena or hematochezia. GU:  No urgency, frequency, dysuria, or hematuria. Musculoskeletal:  No back pain.  No joint pain.  No muscle tenderness. Extremities:  No pain or swelling. Skin:  No rashes or skin changes. Neuro:  No headache, numbness or weakness, balance or coordination issues. Endocrine:  No diabetes, thyroid issues, hot flashes or night sweats. Psych:  No mood changes, depression or anxiety. Pain:  No focal pain. Review of systems:  All other systems reviewed and found  to be negative. As per HPI. Otherwise, a complete review of systems is negatve.  PAST MEDICAL HISTORY: Past Medical History  Diagnosis Date  . Breast cancer   . Throat cancer   . Skin cancer   . Cancer of vocal cord 11/10/2014    PAST SURGICAL HISTORY: Past Surgical History  Procedure Laterality Date  . Skin cancer excision      FAMILY HISTORY Family History  Problem Relation Age of Onset  . Diabetes Mother   . Lung cancer Sister     ADVANCED DIRECTIVES:  Patient does not have any living will or healthcare power of attorney.  Information was given .  Available resources had been discussed.  We will follow-up on subsequent appointments regarding this issue HEALTH MAINTENANCE: Social History  Substance Use Topics  . Smoking status: Former Research scientist (life sciences)  . Smokeless tobacco: None  . Alcohol Use: None      No Known Allergies  Current Outpatient Prescriptions  Medication Sig Dispense Refill  . omeprazole (PRILOSEC) 40 MG capsule TAKE ONE (1) CAPSULE EACH DAY 30 capsule 3  . cyanocobalamin (,VITAMIN B-12,) 1000 MCG/ML injection Inject 1 mL (1,000 mcg total) into the muscle every 30 (thirty) days. for 6 months then once a year. 1 mL 5  . Needles & Syringes MISC 1 needle and syringe every 30 days to use with b12 injections for 6 months. 1 each 5   No current facility-administered medications for this visit.    OBJECTIVE:  Filed Vitals:  12/08/14 1523  BP: 116/79  Pulse: 71  Temp: 97.8 F (36.6 C)     Body mass index is 27.29 kg/(m^2).    ECOG FS:0 - Asymptomatic  PHYSICAL EXAM: GENERAL:  Well developed, well nourished, sitting comfortably in the exam room in no acute distress. MENTAL STATUS:  Alert and oriented to person, place and time. HEAD: Normocephalic, atraumatic, face symmetric, no Cushingoid features. EYES:  .  Pupils equal round and reactive to light and accomodation.  No conjunctivitis or scleral icterus. ENT:  Oropharynx clear without lesion.  Tongue normal.  Mucous membranes moist.  RESPIRATORY:  Clear to auscultation without rales, wheezes or rhonchi. CARDIOVASCULAR:  Regular rate and rhythm without murmur, rub or gallop.  ABDOMEN:  Soft, non-tender, with active bowel sounds, and no hepatosplenomegaly.  No masses. BACK:  No CVA tenderness.  No tenderness on percussion of the back or rib cage. SKIN:  No rashes, ulcers or lesions. EXTREMITIES: No edema, no skin discoloration or tenderness.  No palpable cords. LYMPH NODES: No palpable cervical, supraclavicular, axillary or inguinal adenopathy  NEUROLOGICAL: Unremarkable. PSYCH:  Appropriate.   LAB RESULTS:  Appointment on 12/08/2014  Component Date Value Ref Range Status  . WBC 12/08/2014 4.6  3.6 - 11.0 K/uL Final  . RBC 12/08/2014 4.51  3.80 - 5.20 MIL/uL Final  . Hemoglobin 12/08/2014 14.8  12.0 - 16.0 g/dL Final  . HCT 12/08/2014 43.9  35.0 - 47.0 % Final  . MCV 12/08/2014 97.4  80.0 - 100.0 fL Final  . MCH 12/08/2014 32.9  26.0 - 34.0 pg Final  . MCHC 12/08/2014 33.8  32.0 - 36.0 g/dL Final  . RDW 12/08/2014 11.9  11.5 - 14.5 % Final  . Platelets 12/08/2014 193  150 - 440 K/uL Final  . Neutrophils Relative % 12/08/2014 66   Final  . Neutro Abs 12/08/2014 3.0  1.4 - 6.5 K/uL Final  . Lymphocytes Relative 12/08/2014 23   Final  . Lymphs Abs 12/08/2014 1.1  1.0 - 3.6 K/uL Final  . Monocytes Relative 12/08/2014 8   Final  . Monocytes Absolute 12/08/2014 0.4  0.2 - 0.9 K/uL Final  . Eosinophils Relative 12/08/2014 2   Final  . Eosinophils Absolute 12/08/2014 0.1  0 - 0.7 K/uL Final  . Basophils Relative 12/08/2014 1   Final  . Basophils Absolute 12/08/2014 0.0  0 - 0.1 K/uL Final  . Vitamin B-12 12/08/2014 93* 180 - 914 pg/mL Final   Comment: (NOTE) This assay is not validated for testing neonatal or myeloproliferative syndrome specimens for Vitamin B12 levels. Performed at [facility]   . Folate 12/08/2014 29.0  >5.9 ng/mL Final     STUDIES: No results  found.  ASSESSMENT: Carcinoma of breast getting regular mammogram Carcinoma of the vocal cord Lung nodule.  Repeat CT scan shows stable lung nodule.  Yearly CT scan has been recommended Patient has been a former smoker  MEDICAL DECISION MAKING:  CT scan has been reviewed independently.  All lab data has been reviewed there is neutropenia exact etiology is not clear cBC is within normal limits but patient is significantly B12 deficient She will be started on vitamin B12 injection 1000 g once a month. Also can be prescribed oral vitamin B12  Patient expressed understanding and was in agreement with this plan. She also understands that She can call clinic at any time with any questions, concerns, or complaints.    No matching staging information was found for the patient.  Forest Gleason,  MD   12/13/2014 4:17 PM

## 2014-12-15 ENCOUNTER — Telehealth: Payer: Self-pay | Admitting: *Deleted

## 2014-12-15 NOTE — Telephone Encounter (Signed)
Patient states she was told to take a multivitamin.  Wants to know if there is a certain kind?  Instructed patient to buy any OTC multivitamin. Verbalized understanding.

## 2015-01-12 ENCOUNTER — Emergency Department: Payer: PRIVATE HEALTH INSURANCE

## 2015-01-12 ENCOUNTER — Observation Stay
Admission: EM | Admit: 2015-01-12 | Discharge: 2015-01-13 | Disposition: A | Discharge status: Home / Self Care | Payer: PRIVATE HEALTH INSURANCE | Attending: Internal Medicine | Admitting: Internal Medicine

## 2015-01-12 ENCOUNTER — Encounter: Payer: Self-pay | Admitting: Emergency Medicine

## 2015-01-12 DIAGNOSIS — S32511A Fracture of superior rim of right pubis, initial encounter for closed fracture: Secondary | ICD-10-CM | POA: Diagnosis present

## 2015-01-12 DIAGNOSIS — Z85819 Personal history of malignant neoplasm of unspecified site of lip, oral cavity, and pharynx: Secondary | ICD-10-CM | POA: Diagnosis not present

## 2015-01-12 DIAGNOSIS — W19XXXA Unspecified fall, initial encounter: Secondary | ICD-10-CM | POA: Insufficient documentation

## 2015-01-12 DIAGNOSIS — Z833 Family history of diabetes mellitus: Secondary | ICD-10-CM | POA: Diagnosis not present

## 2015-01-12 DIAGNOSIS — Z87891 Personal history of nicotine dependence: Secondary | ICD-10-CM | POA: Diagnosis not present

## 2015-01-12 DIAGNOSIS — Z853 Personal history of malignant neoplasm of breast: Secondary | ICD-10-CM | POA: Insufficient documentation

## 2015-01-12 DIAGNOSIS — Z808 Family history of malignant neoplasm of other organs or systems: Secondary | ICD-10-CM | POA: Insufficient documentation

## 2015-01-12 DIAGNOSIS — Z79899 Other long term (current) drug therapy: Secondary | ICD-10-CM | POA: Insufficient documentation

## 2015-01-12 DIAGNOSIS — Z8521 Personal history of malignant neoplasm of larynx: Secondary | ICD-10-CM | POA: Insufficient documentation

## 2015-01-12 DIAGNOSIS — S32591A Other specified fracture of right pubis, initial encounter for closed fracture: Secondary | ICD-10-CM | POA: Diagnosis not present

## 2015-01-12 DIAGNOSIS — Z801 Family history of malignant neoplasm of trachea, bronchus and lung: Secondary | ICD-10-CM | POA: Diagnosis not present

## 2015-01-12 DIAGNOSIS — Z85828 Personal history of other malignant neoplasm of skin: Secondary | ICD-10-CM | POA: Diagnosis not present

## 2015-01-12 DIAGNOSIS — S329XXA Fracture of unspecified parts of lumbosacral spine and pelvis, initial encounter for closed fracture: Secondary | ICD-10-CM | POA: Diagnosis present

## 2015-01-12 LAB — CBC
HCT: 43.8 % (ref 35.0–47.0)
HEMOGLOBIN: 14.6 g/dL (ref 12.0–16.0)
MCH: 32.6 pg (ref 26.0–34.0)
MCHC: 33.4 g/dL (ref 32.0–36.0)
MCV: 97.7 fL (ref 80.0–100.0)
PLATELETS: 155 10*3/uL (ref 150–440)
RBC: 4.48 MIL/uL (ref 3.80–5.20)
RDW: 11.9 % (ref 11.5–14.5)
WBC: 6.1 10*3/uL (ref 3.6–11.0)

## 2015-01-12 LAB — CREATININE, SERUM
Creatinine, Ser: 0.67 mg/dL (ref 0.44–1.00)
GFR calc Af Amer: >60 mL/min (ref >60)
GFR calc non Af Amer: >60 mL/min (ref >60)

## 2015-01-12 MED ORDER — ENOXAPARIN SODIUM 40 MG/0.4ML ~~LOC~~ SOLN
40.0000 mg | SUBCUTANEOUS | Status: DC
  Administered 2015-01-12 – 2015-01-13 (×2): 40 mg via SUBCUTANEOUS
  Filled 2015-01-12 (×2): qty 0.4

## 2015-01-12 MED ORDER — PANTOPRAZOLE SODIUM 40 MG PO TBEC
80.0000 mg | DELAYED_RELEASE_TABLET | Freq: Every day | ORAL | Status: DC
  Administered 2015-01-12: 80 mg via ORAL
  Filled 2015-01-12: qty 2

## 2015-01-12 MED ORDER — PANTOPRAZOLE SODIUM 40 MG PO TBEC
40.0000 mg | DELAYED_RELEASE_TABLET | Freq: Every day | ORAL | Status: DC
  Administered 2015-01-13: 40 mg via ORAL
  Filled 2015-01-12: qty 1

## 2015-01-12 MED ORDER — OXYCODONE-ACETAMINOPHEN 5-325 MG PO TABS
1.0000 | ORAL_TABLET | ORAL | Status: DC | PRN
  Administered 2015-01-12 – 2015-01-13 (×4): 1 via ORAL
  Filled 2015-01-12 (×4): qty 1

## 2015-01-12 MED ORDER — MORPHINE SULFATE (PF) 2 MG/ML IV SOLN
2.0000 mg | INTRAVENOUS | Status: DC | PRN
Start: 2015-01-12 — End: 2015-01-13
  Administered 2015-01-12 (×2): 2 mg via INTRAVENOUS
  Filled 2015-01-12 (×2): qty 1

## 2015-01-12 MED ORDER — CALCIUM CARBONATE-VITAMIN D 500-200 MG-UNIT PO TABS
1.0000 | ORAL_TABLET | Freq: Two times a day (BID) | ORAL | Status: DC
  Administered 2015-01-12 – 2015-01-13 (×3): 1 via ORAL
  Filled 2015-01-12 (×5): qty 1

## 2015-01-12 MED ORDER — HYDROMORPHONE HCL 1 MG/ML IJ SOLN
0.5000 mg | Freq: Once | INTRAMUSCULAR | Status: AC
  Administered 2015-01-12: 0.5 mg via INTRAVENOUS
  Filled 2015-01-12: qty 1

## 2015-01-12 MED ORDER — ONDANSETRON 4 MG PO TBDP
4.0000 mg | ORAL_TABLET | Freq: Once | ORAL | Status: AC
  Administered 2015-01-12: 4 mg via ORAL
  Filled 2015-01-12: qty 1

## 2015-01-12 NOTE — Progress Notes (Signed)
Pt pleasant and cooperative with care. Family at bedside most of evening. Complaints of pain to groin area. Prn given with good relief. No other complaints or concerns. Will continue to assess and monitor.

## 2015-01-12 NOTE — H&P (Signed)
[place] at [place] NAME: Denise Watts    MR#:  [id number]  DATE OF BIRTH:  June 02, 1955  DATE OF ADMISSION:  01/12/2015  PRIMARY CARE PHYSICIAN: Vernie Murders, PA   REQUESTING/REFERRING PHYSICIAN: Brenton Grills  CHIEF COMPLAINT:   Chief Complaint  Patient presents with  . Fall    HISTORY OF PRESENT ILLNESS: Denise Watts  is a 59 y.o. female with a known history of gastric reflux, breast cancer in past, multiple skin cancers, lung nodule currently under routine follow-ups and stable, was at her sister's place to take care of her as she has a bone marrow cancer. Her sister was in another room and patient hurt her the thumb. So she tried to just gone to call and check on her and while doing that accidentally she fell down on the floor herself,She hit her right side arm and her leg. And having severe pain in her leg and injury on her arm she was brought to emergency room. X-rays are done and it showed pelvic fracture on the right side nondisplaced, after talking to ER physician with orthopedic patient was given for conservative management to hospitalist service. She denies losing her consciousness, feeling dizzy, palpitation, shortness of breath. And confirms that this fall was purely an accident.   PAST MEDICAL HISTORY:   Past Medical History  Diagnosis Date  . Breast cancer   . Throat cancer   . Skin cancer   . Cancer of vocal cord 11/10/2014    PAST SURGICAL HISTORY:  Past Surgical History  Procedure Laterality Date  . Skin cancer excision      SOCIAL HISTORY:  Social History  Substance Use Topics  . Smoking status: Former Research scientist (life sciences)  . Smokeless tobacco: Not on file  . Alcohol Use: No    FAMILY HISTORY:  Family History  Problem Relation Age of Onset  . Diabetes Mother   . Lung cancer Sister   . Bone cancer Sister     DRUG ALLERGIES: No Known Allergies  REVIEW OF SYSTEMS:   CONSTITUTIONAL: No fever, fatigue or  weakness.  EYES: No blurred or double vision.  EARS, NOSE, AND THROAT: No tinnitus or ear pain.  RESPIRATORY: No cough, shortness of breath, wheezing or hemoptysis.  CARDIOVASCULAR: No chest pain, orthopnea, edema.  GASTROINTESTINAL: No nausea, vomiting, diarrhea or abdominal pain.  GENITOURINARY: No dysuria, hematuria.  ENDOCRINE: No polyuria, nocturia,  HEMATOLOGY: No anemia, easy bruising or bleeding SKIN: No rash or lesion. MUSCULOSKELETAL: right hip joint pain or arthritis.   NEUROLOGIC: No tingling, numbness, weakness.  PSYCHIATRY: No anxiety or depression.   MEDICATIONS AT HOME:  Prior to Admission medications   Medication Sig Start Date End Date Taking? Authorizing Provider  omeprazole (PRILOSEC) 40 MG capsule TAKE ONE (1) CAPSULE EACH DAY 11/06/14  Yes Dennis E Chrismon, PA  cyanocobalamin (,VITAMIN B-12,) 1000 MCG/ML injection Inject 1 mL (1,000 mcg total) into the muscle every 30 (thirty) days. for 6 months then once a year. 12/09/14   Forest Gleason, MD  Needles & Syringes MISC 1 needle and syringe every 30 days to use with b12 injections for 6 months. 12/09/14   Forest Gleason, MD      PHYSICAL EXAMINATION:   VITAL SIGNS: Blood pressure 135/91, pulse 84, resp. rate 20, height 5\' 1"  (1.549 m), weight 62.596 kg (138 lb), SpO2 99 %.  GENERAL:  59 y.o.-year-old patient lying in the bed with no acute distress.  EYES: Pupils equal, round, reactive  to light and accommodation. No scleral icterus. Extraocular muscles intact.  HEENT: Head atraumatic, normocephalic. Oropharynx and nasopharynx clear.  NECK:  Supple, no jugular venous distention. No thyroid enlargement, no tenderness.  LUNGS: Normal breath sounds bilaterally, no wheezing, rales,rhonchi or crepitation. No use of accessory muscles of respiration.  CARDIOVASCULAR: S1, S2 normal. No murmurs, rubs, or gallops.  ABDOMEN: Soft, nontender, nondistended. Bowel sounds present. No organomegaly or mass.  EXTREMITIES: No pedal edema,  cyanosis, or clubbing.  NEUROLOGIC: Cranial nerves II through XII are intact. Muscle strength 5/5 in all extremities except right lower- which is restricted due to pain. Sensation intact. Gait not checked.  PSYCHIATRIC: The patient is alert and oriented x 3.  SKIN: No obvious rash,on right forearm near elbow- a laceration present.   LABORATORY PANEL:   CBC No results for input(s): WBC, HGB, HCT, PLT, MCV, MCH, MCHC, RDW, LYMPHSABS, MONOABS, EOSABS, BASOSABS, BANDABS in the last 168 hours.  Invalid input(s): NEUTRABS, BANDSABD ------------------------------------------------------------------------------------------------------------------  Chemistries  No results for input(s): NA, K, CL, CO2, GLUCOSE, BUN, CREATININE, CALCIUM, MG, AST, ALT, ALKPHOS, BILITOT in the last 168 hours.  Invalid input(s): GFRCGP ------------------------------------------------------------------------------------------------------------------ CrCl cannot be calculated (Patient has no serum creatinine result on file.). ------------------------------------------------------------------------------------------------------------------ No results for input(s): TSH, T4TOTAL, T3FREE, THYROIDAB in the last 72 hours.  Invalid input(s): FREET3   Coagulation profile No results for input(s): INR, PROTIME in the last 168 hours. ------------------------------------------------------------------------------------------------------------------- No results for input(s): DDIMER in the last 72 hours. -------------------------------------------------------------------------------------------------------------------  Cardiac Enzymes No results for input(s): CKMB, TROPONINI, MYOGLOBIN in the last 168 hours.  Invalid input(s): CK ------------------------------------------------------------------------------------------------------------------ Invalid input(s):  POCBNP  ---------------------------------------------------------------------------------------------------------------  Urinalysis No results found for: COLORURINE, APPEARANCEUR, LABSPEC, PHURINE, GLUCOSEU, HGBUR, BILIRUBINUR, KETONESUR, PROTEINUR, UROBILINOGEN, NITRITE, LEUKOCYTESUR   RADIOLOGY: Dg Ribs Unilateral W/chest Right  01/12/2015   CLINICAL DATA:  Fall.  Pain.  EXAM: RIGHT RIBS AND CHEST - 3+ VIEW  COMPARISON:  None.  FINDINGS: Mild cardiomegaly. No acute cardiopulmonary disease. No evidence of displaced rib fracture or pneumothorax. Mild bowel distention. Mild adynamic ileus cannot be excluded.  IMPRESSION: 1. No evidence of displaced rib fracture or pneumothorax. 2. Mild cardiomegaly.  No pulmonary venous congestion. 3. Cannot exclude mild adynamic ileus.   Electronically Signed   By: Marcello Moores  Register   On: 01/12/2015 08:50   Dg Forearm Right  01/12/2015   CLINICAL DATA:  Status post fall in the kitchen with elbow pain today.  EXAM: RIGHT FOREARM - 2 VIEW  COMPARISON:  None.  FINDINGS: There is no evidence of fracture or dislocation. Soft tissues are unremarkable.  IMPRESSION: No acute fracture dislocation noted.   Electronically Signed   By: Abelardo Diesel M.D.   On: 01/12/2015 08:48   Dg Hip Unilat With Pelvis 2-3 Views Right  01/12/2015   CLINICAL DATA:  Fall, right upper leg pain  EXAM: DG HIP (WITH OR WITHOUT PELVIS) 2-3V RIGHT  COMPARISON:  None.  FINDINGS: Acute nondisplaced fractures noted of the right superior and inferior rami. No associated pelvic diastases. Bones are osteopenic. Right hip appears intact. Hips are located and symmetric.  Normal bowel gas pattern.  IMPRESSION: Acute nondisplaced right superior and inferior rami fractures.   Electronically Signed   By: Jerilynn Mages.  Shick M.D.   On: 01/12/2015 08:48    IMPRESSION AND PLAN:  * Accidental fall and right pelvic fracture  Will admit to medical floor, pain control, physical therapy evaluation. Orthopedic consult to help  with further management.  Started on vitamin D and calcium  tablet.  * Gastroesophageal reflux disease   Continue PPI.   * Lung nodule and history of breast cancer   She follows with Dr. Oliva Bustard in office for this issue.  All the records are reviewed and case discussed with ED provider. Management plans discussed with the patient, family and they are in agreement.  CODE STATUS: full.  TOTAL TIME TAKING CARE OF THIS PATIENT: 45 minutes.    Vaughan Basta M.D on 01/12/2015   Between 7am to 6pm - Pager - [phone]  After 6pm go to [email or web address] - password [email or web address] [facility]  [facility]  Office  [phone]  CC: Primary care physician; Vernie Murders, PA

## 2015-01-12 NOTE — ED Provider Notes (Signed)
[facility] Emergency Department Provider Note  ____________________________________________  Time seen: Approximately 7:24 AM  I have reviewed the triage vital signs and the nursing notes.   HISTORY  Chief Complaint Fall   HPI Denise Watts is a 59 y.o. female who presents for evaluation of falling last night. Patient complains of unable to stand or bear weight and complains of pain in her right hip and pelvis. Also complains of right forearm with abrasion and limited range of motion. Denies any loss of consciousness   Past Medical History  Diagnosis Date  . Breast cancer   . Throat cancer   . Skin cancer   . Cancer of vocal cord 11/10/2014    Patient Active Problem List   Diagnosis Date Noted  . Cancer of vocal cord 11/10/2014  . Herpes simplex virus (HSV) epithelial keratitis 03/06/2013  . Nuclear sclerotic cataract 03/06/2013    Past Surgical History  Procedure Laterality Date  . Skin cancer excision      Current Outpatient Rx  Name  Route  Sig  Dispense  Refill  . cyanocobalamin (,VITAMIN B-12,) 1000 MCG/ML injection   Intramuscular   Inject 1 mL (1,000 mcg total) into the muscle every 30 (thirty) days. for 6 months then once a year.   1 mL   5   . Needles & Syringes MISC      1 needle and syringe every 30 days to use with b12 injections for 6 months.   1 each   5   . omeprazole (PRILOSEC) 40 MG capsule      TAKE ONE (1) CAPSULE EACH DAY   30 capsule   3     Allergies Review of patient's allergies indicates no known allergies.  Family History  Problem Relation Age of Onset  . Diabetes Mother   . Lung cancer Sister     Social History Social History  Substance Use Topics  . Smoking status: Former Research scientist (life sciences)  . Smokeless tobacco: None  . Alcohol Use: None    Review of Systems Constitutional: No fever/chills Eyes: No visual changes. ENT: No sore throat. Cardiovascular: Denies chest pain. Respiratory: Denies shortness  of breath. Gastrointestinal: No abdominal pain.  No nausea, no vomiting.  No diarrhea.  No constipation. Genitourinary: Negative for dysuria. Musculoskeletal: Positive for right hip pelvis leg pain. Positive for right forearm pain. Skin: Negative for rash. Positive for skin tear to the right elbow. Neurological: Negative for headaches, focal weakness or numbness.  10-point ROS otherwise negative.  ____________________________________________   PHYSICAL EXAM:  VITAL SIGNS: ED Triage Vitals  Enc Vitals Group     BP 01/12/15 0710 135/91 mmHg     Pulse Rate 01/12/15 0710 84     Resp 01/12/15 0710 20     Temp --      Temp src --      SpO2 01/12/15 0710 99 %     Weight 01/12/15 0710 138 lb (62.596 kg)     Height 01/12/15 0710 5\' 1"  (1.549 m)     Head Cir --      Peak Flow --      Pain Score 01/12/15 0711 10     Pain Loc --      Pain Edu? --      Excl. in [place]? --     Constitutional: Alert and oriented. Well appearing and in mild acute distress. She is seated in wheelchair unable to move. Neck: No stridor.  Full range of motion Cardiovascular:  Normal rate, regular rhythm. Grossly normal heart sounds.  Good peripheral circulation. Respiratory: Normal respiratory effort.  No retractions. Lungs CTAB. Gastrointestinal: Soft and nontender. No distention. No abdominal bruits. No CVA tenderness. Musculoskeletal: No lower extremity tenderness nor edema.  No joint effusions. Neurologic:  Normal speech and language. No gross focal neurologic deficits are appreciated. No gait instability. Skin:  3 cm skin tear noted to the right elbow. Bleeding controlled, no obvious laceration. Psychiatric: Mood and affect are normal. Speech and behavior are normal.  ____________________________________________   LABS (all labs ordered are listed, but only abnormal results are displayed)  Labs Reviewed - No data to display ____________________________________________ RADIOLOGY: Acute nondisplaced  right superior and inferior rami fractures.   PROCEDURES  Procedure(s) performed: None  Critical Care performed: No  ____________________________________________   INITIAL IMPRESSION / ASSESSMENT AND PLAN / ED COURSE  Pertinent labs & imaging results that were available during my care of the patient were reviewed by me and considered in my medical decision making (see chart for details).  Status post fall with right pelvic fracture both the inferior and superior rami. Orthopedics on call notified. Patient had IV saline lock placed with Dilaudid 1 mg and Zofran 8 mg given. Patient to be transferred room 13 for evaluation and admission. ____________________________________________   FINAL CLINICAL IMPRESSION(S) / ED DIAGNOSES  Final diagnoses:  None      Arlyss Repress, PA-C 01/12/15 [place], MD 01/12/15 [phone]

## 2015-01-12 NOTE — ED Notes (Signed)
Report to [place] to room 13

## 2015-01-12 NOTE — Evaluation (Signed)
Physical Therapy Evaluation Patient Details Name: Denise Watts MRN: [id number] DOB: [birth year] Today's Date: 01/12/2015   History of Present Illness  Pt admitted for acute non displaced R superior/inferior pubic rami fracture. Pt fell trying to get out of bed to help her sister who had fallen out of her bed. Pt with history of breast cancer and lung nodule.   Clinical Impression  Pt is a pleasant 59 year old female who was admitted for acute pubic rami fracture from fall at home. Pt previously independent and caring for sister. Pt performs bed mobility, transfers, and ambulation with cga and rw. Pt limited by pain at this time, however improves with resting. Pt demonstrates deficits with strength/pain/mobility. Would benefit from skilled PT to address above deficits and promote optimal return to PLOF.      Follow Up Recommendations Home health PT;Supervision for mobility/OOB    Equipment Recommendations  Rolling walker with 5" wheels;3in1 (PT)    Recommendations for Other Services       Precautions / Restrictions Precautions Precautions: Fall Restrictions Weight Bearing Restrictions: Yes RLE Weight Bearing: Touchdown weight bearing      Mobility  Bed Mobility Overal bed mobility: Needs Assistance Bed Mobility: Supine to Sit     Supine to sit: Min guard     General bed mobility comments: supine->sit with cga. Pt cued for correct technique and once seated at EOB, pt able to sit with supervision.  Transfers Overall transfer level: Needs assistance Equipment used: Rolling walker (2 wheeled) Transfers: Sit to/from Stand Sit to Stand: Min guard         General transfer comment: sit<>Stand with rw. Safe technique performed. Increased pain noted with mobility.  Ambulation/Gait Ambulation/Gait assistance: Min guard Ambulation Distance (Feet): 3 Feet Assistive device: Rolling walker (2 wheeled) Gait Pattern/deviations: Step-to pattern     General Gait Details:  ambulated to recliner with safe technique. Cues given for sequencing of rw and maintaining correct WB status. Pain limiting further mobility.  Stairs            Wheelchair Mobility    Modified Rankin (Stroke Patients Only)       Balance Overall balance assessment: Needs assistance Sitting-balance support: Feet supported Sitting balance-Leahy Scale: Normal     Standing balance support: Bilateral upper extremity supported Standing balance-Leahy Scale: Good                               Pertinent Vitals/Pain Pain Assessment: 0-10 Pain Score: 7  Pain Location: R hip/elbow Pain Descriptors / Indicators: Constant;Aching Pain Intervention(s): Limited activity within patient's tolerance    Home Living Family/patient expects to be discharged to:: Private residence Living Arrangements: Spouse/significant other Available Help at Discharge: Family Type of Home: House Home Access: Stairs to enter Entrance Stairs-Rails: Can reach both Entrance Stairs-Number of Steps: 5 Home Layout: One level Home Equipment: None      Prior Function Level of Independence: Independent               Hand Dominance        Extremity/Trunk Assessment   Upper Extremity Assessment: Generalized weakness           Lower Extremity Assessment: Generalized weakness         Communication   Communication: No difficulties  Cognition Arousal/Alertness: Awake/alert Behavior During Therapy: WFL for tasks assessed/performed Overall Cognitive Status: Within Functional Limits for tasks assessed  General Comments      Exercises Other Exercises Other Exercises: seated ther-ex performed on B LEs including glut sets, ankle pumps, and quad sets. All ther-ex performed x 10 reps with cues for pain control.      Assessment/Plan    PT Assessment Patient needs continued PT services  PT Diagnosis Difficulty walking;Generalized weakness;Acute  pain;Abnormality of gait   PT Problem List Decreased strength;Decreased balance;Decreased knowledge of use of DME;Pain  PT Treatment Interventions Gait training;Therapeutic exercise   PT Goals (Current goals can be found in the Care Plan section) Acute Rehab PT Goals Patient Stated Goal: to go home PT Goal Formulation: With patient Time For Goal Achievement: 01/26/15 Potential to Achieve Goals: Good    Frequency 7X/week   Barriers to discharge        Co-evaluation               End of Session Equipment Utilized During Treatment: Gait belt Activity Tolerance: Patient limited by pain Patient left: in chair (no chair alarm present; RN notified) Nurse Communication: Mobility status    Functional Assessment Tool Used: clinical judgement Functional Limitation: Mobility: Walking and moving around Mobility: Walking and Moving Around Current Status (Z8588): At least 20 percent but less than 40 percent impaired, limited or restricted Mobility: Walking and Moving Around Goal Status [phone]): At least 1 percent but less than 20 percent impaired, limited or restricted    Time: 4128-7867 PT Time Calculation (min) (ACUTE ONLY): 25 min   Charges:   PT Evaluation $Initial PT Evaluation Tier I: 1 Procedure PT Treatments $Therapeutic Exercise: 8-22 mins   PT G Codes:   PT G-Codes **NOT FOR INPATIENT CLASS** Functional Assessment Tool Used: clinical judgement Functional Limitation: Mobility: Walking and moving around Mobility: Walking and Moving Around Current Status (E7209): At least 20 percent but less than 40 percent impaired, limited or restricted Mobility: Walking and Moving Around Goal Status [phone]): At least 1 percent but less than 20 percent impaired, limited or restricted    Marcelene Weidemann 01/12/2015, 5:05 PM  Greggory Stallion, PT, DPT [phone]

## 2015-01-12 NOTE — Consult Note (Signed)
ORTHOPAEDIC CONSULTATION  REQUESTING PHYSICIAN: Vaughan Basta, MD  Chief Complaint:   Right groin pain status post fall.  History of Present Illness: Denise Watts is a 59 y.o. female with a history of gastroesophageal reflux disease, breast cancer, and skin cancer who was at her sister's place watching out for her sister who is being treated for bone cancer. Approximately 12:30 this morning, she heard a thump from her sister's room who apparently fell while getting out of bed. As the patient tried to rush out of bed, she lost her balance and fell onto her right side. She was able to ride herself back to bed, but called her husband around 4:30 this morning due to continued severe right hip pain. She was brought to the emergency room where x-rays demonstrated nondisplaced fractures of the superior and inferior pubic rami. She is admitted at this time for pain control and possible placement. The patient denies any lightheadedness, dizziness, chest pain, or other symptoms that may have precipitated her fall. She denies any associated injuries from the fall, and denies any head injury or loss of consciousness. She denies any numbness or paresthesias down her leg at this time, nor does she recall any prior injury to the right hip region.  Past Medical History  Diagnosis Date  . Breast cancer   . Throat cancer   . Skin cancer   . 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 Skin cancer excision     Social History   Social History  . Marital Status: Married    Spouse Name: N/A  . Number of Children: N/A  . Years of Education: N/A   Social History Main Topics  . Smoking status: Former Research scientist (life sciences)  . Smokeless tobacco: None  . Alcohol Use: No  . Drug Use: No  . Sexual Activity: Not Asked   Other Topics Concern  . None   Social History Narrative   Family History  Problem Relation  Age of Onset  . Diabetes Mother   . Lung cancer Sister   . Bone cancer Sister    No Known Allergies Prior to Admission medications   Medication Sig Start Date End Date Taking? Authorizing Provider  omeprazole (PRILOSEC) 40 MG capsule TAKE ONE (1) CAPSULE EACH DAY 11/06/14  Yes Dennis E Chrismon, PA  cyanocobalamin (,VITAMIN B-12,) 1000 MCG/ML injection Inject 1 mL (1,000 mcg total) into the muscle every 30 (thirty) days. for 6 months then once a year. 12/09/14   Forest Gleason, MD  Needles & Syringes MISC 1 needle and syringe every 30 days to use with b12 injections for 6 months. 12/09/14   Forest Gleason, MD   Dg Ribs Unilateral W/chest Right  01/12/2015   CLINICAL DATA:  Fall.  Pain.  EXAM: RIGHT RIBS AND CHEST - 3+ VIEW  COMPARISON:  None.  FINDINGS: Mild cardiomegaly. No acute cardiopulmonary disease. No evidence of displaced rib fracture or pneumothorax. Mild bowel distention. Mild adynamic ileus cannot be excluded.  IMPRESSION: 1. No evidence of displaced rib fracture or pneumothorax. 2. Mild cardiomegaly.  No pulmonary venous congestion. 3. Cannot exclude mild adynamic ileus.   Electronically Signed   By: Marcello Moores  Register   On: 01/12/2015 08:50   Dg Forearm Right  01/12/2015   CLINICAL DATA:  Status post fall in the kitchen with elbow pain today.  EXAM: RIGHT FOREARM - 2 VIEW  COMPARISON:  None.  FINDINGS: There is no evidence of fracture or dislocation.  Soft tissues are unremarkable.  IMPRESSION: No acute fracture dislocation noted.   Electronically Signed   By: Abelardo Diesel M.D.   On: 01/12/2015 08:48   Dg Hip Unilat With Pelvis 2-3 Views Right  01/12/2015   CLINICAL DATA:  Fall, right upper leg pain  EXAM: DG HIP (WITH OR WITHOUT PELVIS) 2-3V RIGHT  COMPARISON:  None.  FINDINGS: Acute nondisplaced fractures noted of the right superior and inferior rami. No associated pelvic diastases. Bones are osteopenic. Right hip appears intact. Hips are located and symmetric.  Normal bowel gas pattern.   IMPRESSION: Acute nondisplaced right superior and inferior rami fractures.   Electronically Signed   By: Jerilynn Mages.  Shick M.D.   On: 01/12/2015 08:48    Positive ROS: All other systems have been reviewed and were otherwise negative with the exception of those mentioned in the HPI and as above.  Physical Exam: General:  Alert, no acute distress Psychiatric:  Patient is competent for consent with normal mood and affect   Cardiovascular:  No pedal edema Respiratory:  No wheezing, non-labored breathing GI:  Abdomen is soft and non-tender Skin:  No lesions in the area of chief complaint Neurologic:  Sensation intact distally Lymphatic:  No axillary or cervical lymphadenopathy  Orthopedic Exam:  Orthopedic examination is limited to the right hip and lower extremity. Skin inspection demonstrates some early ecchymosis in the right buttock region but otherwise is unremarkable. She has tenderness to palpation in the anterior aspect of the hip, as well as pain with attempted active hip flexion. Gentle passive log rolling of the hip causes only minimal discomfort. She is neurovascularly intact to her right foot and lower extremity.  X-rays:  X-rays of the pelvis and right hip are available for review. These films demonstrate nondisplaced fractures of the right superior and inferior pubic rami. The hip itself appears to be intact. There is at most minimal degenerative changes of the hip. No lytic lesions or other abnormalities are identified.  Assessment: Nondisplaced superior and inferior right pubic rami fractures.  Plan: The treatment options are discussed with the patient. She is pleased to hear that this is not a fracture that require surgical intervention. She may be mobilized with physical therapy once her pain is under adequate control. Therapy should work with her on gait and transfer training toe-touch weightbearing on the right and using a walker for support. She may gradually progress in her  activities and her weightbearing status as symptoms permit. She understands that this fracture indicates that she may have early osteoporosis which should be addressed more aggressively by her primary care provider.  Thank you for asking up despite in the care of this most pleasant woman. I will be happy to see her back in my office in approximate 1 month for a repeat x-ray and further evaluation.   Pascal Lux, MD  Beeper #:  [phone]  01/12/2015 1:24 PM

## 2015-01-12 NOTE — Plan of Care (Signed)
Problem: Discharge Progression Outcomes Goal: Tolerating diet Outcome: Progressing Tolerating diet

## 2015-01-12 NOTE — ED Notes (Signed)
Pt states her sister fell so she got out of the bed in a hurry o help and tripped and fell. Pain to right upper leg, pain to right elbow.  Skin tear noted to elbow.

## 2015-01-13 LAB — CBC
HCT: 40.2 % (ref 35.0–47.0)
Hemoglobin: 13.5 g/dL (ref 12.0–16.0)
MCH: 32.6 pg (ref 26.0–34.0)
MCHC: 33.6 g/dL (ref 32.0–36.0)
MCV: 97 fL (ref 80.0–100.0)
PLATELETS: 146 10*3/uL — AB (ref 150–440)
RBC: 4.15 MIL/uL (ref 3.80–5.20)
RDW: 11.6 % (ref 11.5–14.5)
WBC: 3.7 10*3/uL (ref 3.6–11.0)

## 2015-01-13 LAB — BASIC METABOLIC PANEL
Anion gap: 6 (ref 5–15)
BUN: 10 mg/dL (ref 6–20)
CO2: 28 mmol/L (ref 22–32)
Calcium: 9 mg/dL (ref 8.9–10.3)
Chloride: 108 mmol/L (ref 101–111)
Creatinine, Ser: 0.79 mg/dL (ref 0.44–1.00)
GFR calc Af Amer: >60 mL/min (ref >60)
GFR calc non Af Amer: >60 mL/min (ref >60)
Glucose, Bld: 90 mg/dL (ref 65–99)
Potassium: 4 mmol/L (ref 3.5–5.1)
Sodium: 142 mmol/L (ref 135–145)

## 2015-01-13 MED ORDER — CALCIUM CARBONATE-VITAMIN D 500-200 MG-UNIT PO TABS: 1.0000 | ORAL_TABLET | Freq: Two times a day (BID) | ORAL | Status: DC

## 2015-01-13 MED ORDER — ENOXAPARIN SODIUM 40 MG/0.4ML ~~LOC~~ SOLN: 40.0000 mg | SUBCUTANEOUS | Status: DC

## 2015-01-13 MED ORDER — CYANOCOBALAMIN 1000 MCG/ML IJ SOLN
1000.0000 ug | Freq: Once | INTRAMUSCULAR | Status: AC
  Administered 2015-01-13: 1000 ug via INTRAMUSCULAR
  Filled 2015-01-13: qty 1

## 2015-01-13 MED ORDER — OXYCODONE-ACETAMINOPHEN 5-325 MG PO TABS: 1.0000 | ORAL_TABLET | ORAL | Status: DC | PRN

## 2015-01-13 NOTE — Progress Notes (Signed)
Physical Therapy Treatment Patient Details Name: Denise Watts MRN: [id number] DOB: 17-Jul-1955 Today's Date: 01/13/2015    History of Present Illness Pt admitted for acute non displaced R superior/inferior pubic rami fracture. Pt fell trying to get out of bed to help her sister who had fallen out of her bed. Pt with history of breast cancer and lung nodule.     PT Comments      Pt performing mobility well today with increased ambulation and independence with bed mobility and transfers. She is very pleasant and motivated to participate in therapy. Pt demonstrates that she can safely navigate household distances today. Although she has pain, she is able to manage pain through slow, controlled mobility. Due to her acute pain mobility deficits she will continue to benefit from skilled PT in order for her to return home safely. This entire session was guided, instructed, and directly supervised by Greggory Stallion, DPT.          Follow Up Recommendations  Home health PT;Supervision for mobility/OOB     Equipment Recommendations  Rolling walker with 5" wheels;3in1 (PT)    Recommendations for Other Services       Precautions / Restrictions Precautions Precautions: Fall Restrictions Weight Bearing Restrictions: Yes RLE Weight Bearing: Touchdown weight bearing    Mobility  Bed Mobility Overal bed mobility: Modified Independent Bed Mobility: Supine to Sit     Supine to sit: Modified independent (Device/Increase time)     General bed mobility comments: Pt performs bed mobility with good use of handrails and increased time to perform tasks. Needs no assistance  Transfers Overall transfer level: Needs assistance Equipment used: Rolling walker (2 wheeled) Transfers: Sit to/from Stand Sit to Stand: Min guard         General transfer comment: Pt with good noted strength and good use of hands. No LOB or unsteadiness  Ambulation/Gait Ambulation/Gait assistance: Min  guard Ambulation Distance (Feet): 40 Feet Assistive device: Rolling walker (2 wheeled) Gait Pattern/deviations:  (Hop with toe touch weight bearing. ) Gait velocity: decreased Gait velocity interpretation: Below normal speed for age/gender General Gait Details: Pt needs cues to push through hands to off load RLE. Pt appears very stable in her gait and is able to navigate over thresholds and in tight spaces within the bathroom.     Stairs            Wheelchair Mobility    Modified Rankin (Stroke Patients Only)       Balance Overall balance assessment: History of Falls   Sitting balance-Leahy Scale: Normal       Standing balance-Leahy Scale: Good                      Cognition Arousal/Alertness: Awake/alert Behavior During Therapy: WFL for tasks assessed/performed Overall Cognitive Status: Within Functional Limits for tasks assessed                      Exercises Other Exercises Other Exercises: Pt performed bilateral therex on LEs x 12 reps at supervision for proper technique: ankle pumps, quad sets, hip abd, and SLR (min assist)    General Comments        Pertinent Vitals/Pain Pain Assessment: 0-10 Pain Score: 6  Pain Location: R hip/posterior aspect Pain Intervention(s): Limited activity within patient's tolerance;Monitored during session;Premedicated before session    Home Living  Prior Function            PT Goals (current goals can now be found in the care plan section) Acute Rehab PT Goals Patient Stated Goal: to walk to bathroom PT Goal Formulation: With patient Time For Goal Achievement: 01/26/15 Potential to Achieve Goals: Good Progress towards PT goals: Progressing toward goals    Frequency  7X/week    PT Plan Current plan remains appropriate    Co-evaluation             End of Session Equipment Utilized During Treatment: Gait belt Activity Tolerance: Patient tolerated treatment  well Patient left: in bed;with call bell/phone within reach;with chair alarm set;with family/visitor present     Time: 1443-1540 PT Time Calculation (min) (ACUTE ONLY): 24 min  Charges:                       G CodesJanyth Contes 02-05-2015, 11:16 AM  Janyth Contes, SPT. [phone]

## 2015-01-13 NOTE — Progress Notes (Signed)
Physical Therapy Treatment Patient Details Name: Denise Watts MRN: [id number] DOB: 1955/09/05 Today's Date: 01/13/2015    History of Present Illness Pt admitted for acute non displaced R superior/inferior pubic rami fracture. Pt fell trying to get out of bed to help her sister who had fallen out of her bed. Pt with history of breast cancer and lung nodule.     PT Comments    Pt performing mobility well today with increased ambulation and independence with bed mobility and transfers. She is very pleasant and motivated to participate in therapy. Pt demonstrates that she can safely navigate household distances today. Although she has pain, she is able to manage pain through slow, controlled mobility. Due to her acute pain mobility deficits she will continue to benefit from skilled PT in order for her to return home safely.   Follow Up Recommendations  Home health PT;Supervision for mobility/OOB     Equipment Recommendations  Rolling walker with 5" wheels;3in1 (PT)    Recommendations for Other Services       Precautions / Restrictions Precautions Precautions: Fall Restrictions Weight Bearing Restrictions: Yes RLE Weight Bearing: Touchdown weight bearing    Mobility  Bed Mobility Overal bed mobility: Modified Independent Bed Mobility: Supine to Sit     Supine to sit: Modified independent (Device/Increase time)     General bed mobility comments: Pt performs bed mobility with good use of handrails and increased time to perform tasks. Needs no assistance  Transfers Overall transfer level: Needs assistance Equipment used: Rolling walker (2 wheeled) Transfers: Sit to/from Stand Sit to Stand: Min guard         General transfer comment: Pt with good noted strength and good use of hands. No LOB or unsteadiness  Ambulation/Gait Ambulation/Gait assistance: Min guard Ambulation Distance (Feet): 40 Feet Assistive device: Rolling walker (2 wheeled) Gait Pattern/deviations:  (Hop  with toe touch weight bearing. ) Gait velocity: decreased Gait velocity interpretation: Below normal speed for age/gender General Gait Details: Pt needs cues to push through hands to off load RLE. Pt appears very stable in her gait and is able to navigate over thresholds and in tight spaces within the bathroom.     Stairs            Wheelchair Mobility    Modified Rankin (Stroke Patients Only)       Balance Overall balance assessment: History of Falls   Sitting balance-Leahy Scale: Normal       Standing balance-Leahy Scale: Good                      Cognition Arousal/Alertness: Awake/alert Behavior During Therapy: WFL for tasks assessed/performed Overall Cognitive Status: Within Functional Limits for tasks assessed                      Exercises Other Exercises Other Exercises: Pt performed bilateral therex on LEs x 12 reps at supervision for proper technique: ankle pumps, quad sets, hip abd, and SLR (min assist)    General Comments        Pertinent Vitals/Pain Pain Assessment: 0-10 Pain Score: 6  Pain Location: R hip/posterior aspect Pain Intervention(s): Limited activity within patient's tolerance;Monitored during session;Premedicated before session    Home Living                      Prior Function            PT Goals (current goals can now be  found in the care plan section) Acute Rehab PT Goals Patient Stated Goal: to walk to bathroom PT Goal Formulation: With patient Time For Goal Achievement: 01/26/15 Potential to Achieve Goals: Good Progress towards PT goals: Progressing toward goals    Frequency  7X/week    PT Plan Current plan remains appropriate    Co-evaluation             End of Session Equipment Utilized During Treatment: Gait belt Activity Tolerance: Patient tolerated treatment well Patient left: in bed;with call bell/phone within reach;with chair alarm set;with family/visitor present     Time:  2458-0998 PT Time Calculation (min) (ACUTE ONLY): 24 min  Charges:                       G CodesJanyth Contes 01/15/15, 11:14 AM  Janyth Contes, SPT. [phone]

## 2015-01-13 NOTE — Progress Notes (Signed)
[place], [place].   01/13/2015  Patient: Denise Watts   Date of Birth:  08-Jun-1955  Date of admission:  01/12/2015  Date of Discharge  01/13/2015    To Whom it May Concern:   [name]  may return to work on 01/21/15.  PHYSICAL ACTIVITY:  Moderate- as tolerated.  If you have any questions or concerns, please don't hesitate to call.  Sincerely,   Vaughan Basta M.D Pager [phone] Office : [phone]   .

## 2015-01-13 NOTE — Care Management Note (Addendum)
Case Management Note  Patient Details  Name: Denise Watts MRN: [id number] Date of Birth: Jul 20, 1955  Subjective/Objective:                  Met with patient and her husband to discuss discharge planning. She plans to return home and would like to use [facility] for home health. She states she will need rolling walker and bedside commode- Rx placed per Dr. Roland Rack for home use. Per Dr. Roland Rack HHPT weight bearing as tolerated. She uses [facility]   [phone] for Rx. Per Dr. Roland Rack Lovenox 38m SQ daily for 14 days needed- prior auth required.  Action/Plan: List of home health providers with contact numbers left with husband/patient. Referral made to [facility] I have also requested that rolling walker and bedside commode be delivered to this room prior to discharge today by [place] Lovenox called in per Dr. PRoland Rack   Expected Discharge Date:                  Expected Discharge Plan:     In-House Referral:     Discharge planning Services  CM Consult  Post Acute Care Choice:  Home Health, Durable Medical Equipment Choice offered to:  Patient, Spouse  DME Arranged:  Walker rolling, Bedside commode DME Agency:  [place]  [facility] [place] Status of Service:  In process, will continue to follow  Medicare Important Message Given:    Date Medicare IM Given:    Medicare IM give by:    Date Additional Medicare IM Given:    Additional Medicare Important Message give by:     If discussed at [place]of Stay Meetings, dates discussed:    Additional Comments: Rolling walker and bedside commode delivered- high co-pays but patient agreed. Received notification that [facility] was not in-network with "[place]. Patient has [place]which has been confirmed with [employer] verification. I first contacted patient HR department through [employer] she told me to call  [facility]. I contacted [facility] [phone] was referred to [phone] [phone] pre-certification for home health PT. Lovenox "will be authorized," per JRoselyn Reefwith [place] JRoselyn Reefwith [facility] referred me back to [facility] as "they provide pre-certification for home health". I was then transferred back by to [facility]. I finally spoke with DTimmothy Soursat M[facility]and he states that [place] been [place] [facility] [place]. JCorene Corneawith AKelly[place]made aware. However, when I called the pharmacy back I was notified that Lovenox needs prior auth. I contacted [phone] by pharmacy to seek prior authorization- [facility]. I have faxed authorization form to [facility] [id number]

## 2015-01-13 NOTE — Discharge Summary (Signed)
[facility] [place] NAME: Denise Watts    MR#:  [id number]  DATE OF BIRTH:  May 23, 1955  DATE OF ADMISSION:  01/12/2015 ADMITTING PHYSICIAN: Vaughan Basta, MD  DATE OF DISCHARGE: 01/13/2015  PRIMARY CARE PHYSICIAN: Vernie Murders, PA    ADMISSION DIAGNOSIS:  Inferior pubic ramus fracture, right, closed, initial encounter [S32.591A] Fracture of superior pubic ramus, right, closed, initial encounter [S32.511A]  DISCHARGE DIAGNOSIS:  Active Problems:   Pelvic fracture   SECONDARY DIAGNOSIS:   Past Medical History  Diagnosis Date  . Breast cancer   . Throat cancer   . Skin cancer   . 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 Orthopedic consult to help with further management. Started on vitamin D and calcium tablet.  Will get PT at home.  * Gastroesophageal reflux disease  Continue PPI.   * Lung nodule and history of breast cancer  She follows with Dr. Oliva Bustard in office for this issue.   DISCHARGE CONDITIONS:   Stable.  CONSULTS OBTAINED:   Ortho- [facility].  DRUG ALLERGIES:  No Known Allergies  DISCHARGE MEDICATIONS:   Current Discharge Medication List    START taking these medications   Details  calcium-vitamin D (OSCAL WITH D) 500-200 MG-UNIT per tablet Take 1 tablet by mouth 2 (two) times daily. Qty: 60 tablet, Refills: 1    enoxaparin (LOVENOX) 40 MG/0.4ML injection Inject 0.4 mLs (40 mg total) into the skin daily. Qty: 14 Syringe, Refills: 0    oxyCODONE-acetaminophen (PERCOCET/ROXICET) 5-325 MG per tablet Take 1 tablet by mouth every 4 (four) hours as needed for moderate pain or severe pain. Qty: 40 tablet, Refills: 0      CONTINUE these medications which have NOT CHANGED   Details  omeprazole (PRILOSEC) 40 MG capsule TAKE ONE (1) CAPSULE EACH DAY Qty: 30  capsule, Refills: 3    cyanocobalamin (,VITAMIN B-12,) 1000 MCG/ML injection Inject 1 mL (1,000 mcg total) into the muscle every 30 (thirty) days. for 6 months then once a year. Qty: 1 mL, Refills: 5   Associated Diagnoses: B12 deficiency    Needles & Syringes MISC 1 needle and syringe every 30 days to use with b12 injections for 6 months. Qty: 1 each, Refills: 5   Associated Diagnoses: B12 deficiency         DISCHARGE INSTRUCTIONS:    Follow with Ortho clinic in 3 weeks.  If you experience worsening of your admission symptoms, develop shortness of breath, life threatening emergency, suicidal or homicidal thoughts you must seek medical attention immediately by calling 911 or calling your MD immediately  if symptoms less severe.  You Must read complete instructions/literature along with all the possible adverse reactions/side effects for all the Medicines you take and that have been prescribed to you. Take any new Medicines after you have completely understood and accept all the possible adverse reactions/side effects.   Please note  You were cared for by a hospitalist during your hospital stay. If you have any questions about your discharge medications or the care you received while you were in the hospital after you are discharged, you can call the unit and asked to speak with the hospitalist on call if the hospitalist that took care of you is not available. Once you are discharged, your primary care physician will handle any further medical issues. Please note that  NO REFILLS for any discharge medications will be authorized once you are discharged, as it is imperative that you return to your primary care physician (or establish a relationship with a primary care physician if you do not have one) for your aftercare needs so that they can reassess your need for medications and monitor your lab values.    Today   CHIEF COMPLAINT:   Chief Complaint  Patient presents with  . Fall     HISTORY OF PRESENT ILLNESS:  Denise Watts  is a 59 y.o. female with a known history of gastric reflux, breast cancer in past, multiple skin cancers, lung nodule currently under routine follow-ups and stable, was at her sister's place to take care of her as she has a bone marrow cancer. Her sister was in another room and patient hurt her the thumb. So she tried to just gone to call and check on her and while doing that accidentally she fell down on the floor herself,She hit her right side arm and her leg. And having severe pain in her leg and injury on her arm she was brought to emergency room. X-rays are done and it showed pelvic fracture on the right side nondisplaced, after talking to ER physician with orthopedic patient was given for conservative management to hospitalist service. She denies losing her consciousness, feeling dizzy, palpitation, shortness of breath. And confirms that this fall was purely an accident.  VITAL SIGNS:  Blood pressure 122/78, pulse 72, temperature 97.9 F (36.6 C), temperature source Oral, resp. rate 18, height 5\' 1"  (1.549 m), weight 62.596 kg (138 lb), SpO2 97 %.  I/O:   Intake/Output Summary (Last 24 hours) at 01/13/15 1114 Last data filed at 01/13/15 0955  Gross per 24 hour  Intake    358 ml  Output   1325 ml  Net   -967 ml    PHYSICAL EXAMINATION:   GENERAL: 59 y.o.-year-old patient lying in the bed with no acute distress.  EYES: Pupils equal, round, reactive to light and accommodation. No scleral icterus. Extraocular muscles intact.  HEENT: Head atraumatic, normocephalic. Oropharynx and nasopharynx clear.  NECK: Supple, no jugular venous distention. No thyroid enlargement, no tenderness.  LUNGS: Normal breath sounds bilaterally, no wheezing, rales,rhonchi or crepitation. No use of accessory muscles of respiration.  CARDIOVASCULAR: S1, S2 normal. No murmurs, rubs, or gallops.  ABDOMEN: Soft, nontender, nondistended. Bowel sounds present. No  organomegaly or mass.  EXTREMITIES: No pedal edema, cyanosis, or clubbing.  NEUROLOGIC: Cranial nerves II through XII are intact. Muscle strength 5/5 in all extremities except right lower- which is restricted due to pain. Sensation intact. Gait not checked.  PSYCHIATRIC: The patient is alert and oriented x 3.  SKIN: No obvious rash,on right forearm near elbow- a laceration present.   DATA REVIEW:   CBC  Recent Labs Lab 01/13/15 0605  WBC 3.7  HGB 13.5  HCT 40.2  PLT 146*    Chemistries   Recent Labs Lab 01/13/15 0605  NA 142  K 4.0  CL 108  CO2 28  GLUCOSE 90  BUN 10  CREATININE 0.79  CALCIUM 9.0    Cardiac Enzymes No results for input(s): TROPONINI in the last 168 hours.  Microbiology Results  No results found for this or any previous visit.  RADIOLOGY:  Dg Ribs Unilateral W/chest Right  01/12/2015   CLINICAL DATA:  Fall.  Pain.  EXAM: RIGHT RIBS AND CHEST - 3+ VIEW  COMPARISON:  None.  FINDINGS: Mild cardiomegaly. No acute  cardiopulmonary disease. No evidence of displaced rib fracture or pneumothorax. Mild bowel distention. Mild adynamic ileus cannot be excluded.  IMPRESSION: 1. No evidence of displaced rib fracture or pneumothorax. 2. Mild cardiomegaly.  No pulmonary venous congestion. 3. Cannot exclude mild adynamic ileus.   Electronically Signed   By: Marcello Moores  Register   On: 01/12/2015 08:50   Dg Forearm Right  01/12/2015   CLINICAL DATA:  Status post fall in the kitchen with elbow pain today.  EXAM: RIGHT FOREARM - 2 VIEW  COMPARISON:  None.  FINDINGS: There is no evidence of fracture or dislocation. Soft tissues are unremarkable.  IMPRESSION: No acute fracture dislocation noted.   Electronically Signed   By: Abelardo Diesel M.D.   On: 01/12/2015 08:48   Dg Hip Unilat With Pelvis 2-3 Views Right  01/12/2015   CLINICAL DATA:  Fall, right upper leg pain  EXAM: DG HIP (WITH OR WITHOUT PELVIS) 2-3V RIGHT  COMPARISON:  None.  FINDINGS: Acute nondisplaced fractures  noted of the right superior and inferior rami. No associated pelvic diastases. Bones are osteopenic. Right hip appears intact. Hips are located and symmetric.  Normal bowel gas pattern.  IMPRESSION: Acute nondisplaced right superior and inferior rami fractures.   Electronically Signed   By: Jerilynn Mages.  Shick M.D.   On: 01/12/2015 08:48     Management plans discussed with the patient, family and they are in agreement.  CODE STATUS:     Code Status Orders        Start     Ordered   01/12/15 1200  Full code   Continuous     01/12/15 1159      TOTAL TIME TAKING CARE OF THIS PATIENT: 35 minutes.    Vaughan Basta M.D on 01/13/2015 at 11:14 AM  Between 7am to 6pm - Pager - [phone]  After 6pm go to [email or web address] - password [email or web address] [facility]  [facility]  Office  [phone]  CC: Primary care physician; Vernie Murders, PA

## 2015-01-14 NOTE — Care Management (Signed)
Post discharge: Received callback from patient's [phone] stating that her Lovenox will not go through- requiring prior auth. I have contact prior authorization center again [phone] [facility]- They said it "may be tomorrow". I explained that patient would miss today's dose and has a high risk for DVT/PE. I have notified pharmacy that they will receive fax from [place] with auth hopefully by this evening. Pharmacy will provide missed dose.

## 2015-01-15 ENCOUNTER — Other Ambulatory Visit: Payer: Self-pay

## 2015-01-15 DIAGNOSIS — S32509A Unspecified fracture of unspecified pubis, initial encounter for closed fracture: Secondary | ICD-10-CM | POA: Insufficient documentation

## 2015-01-15 DIAGNOSIS — K219 Gastro-esophageal reflux disease without esophagitis: Secondary | ICD-10-CM | POA: Insufficient documentation

## 2015-01-15 DIAGNOSIS — M25569 Pain in unspecified knee: Secondary | ICD-10-CM | POA: Insufficient documentation

## 2015-01-15 DIAGNOSIS — Z85819 Personal history of malignant neoplasm of unspecified site of lip, oral cavity, and pharynx: Secondary | ICD-10-CM | POA: Insufficient documentation

## 2015-01-15 DIAGNOSIS — Z9889 Other specified postprocedural states: Secondary | ICD-10-CM | POA: Insufficient documentation

## 2015-01-16 ENCOUNTER — Encounter: Payer: Self-pay | Admitting: Family Medicine

## 2015-01-16 ENCOUNTER — Ambulatory Visit (INDEPENDENT_AMBULATORY_CARE_PROVIDER_SITE_OTHER): Payer: PRIVATE HEALTH INSURANCE | Admitting: Family Medicine

## 2015-01-16 VITALS — BP 116/80 | HR 78 | Temp 97.7°F | Resp 20 | Wt 138.0 lb

## 2015-01-16 DIAGNOSIS — Z85819 Personal history of malignant neoplasm of unspecified site of lip, oral cavity, and pharynx: Secondary | ICD-10-CM

## 2015-01-16 DIAGNOSIS — S329XXA Fracture of unspecified parts of lumbosacral spine and pelvis, initial encounter for closed fracture: Secondary | ICD-10-CM | POA: Diagnosis not present

## 2015-01-16 DIAGNOSIS — Z85818 Personal history of malignant neoplasm of other sites of lip, oral cavity, and pharynx: Secondary | ICD-10-CM | POA: Diagnosis not present

## 2015-01-16 DIAGNOSIS — R350 Frequency of micturition: Secondary | ICD-10-CM | POA: Diagnosis not present

## 2015-01-16 LAB — POCT URINALYSIS DIPSTICK
Bilirubin, UA: NEGATIVE
Blood, UA: NEGATIVE
Glucose, UA: NEGATIVE
Ketones, UA: NEGATIVE
LEUKOCYTES UA: NEGATIVE
NITRITE UA: NEGATIVE
PROTEIN UA: NEGATIVE
SPEC GRAV UA: 1.01
UROBILINOGEN UA: 0.2
pH, UA: 7.5

## 2015-01-16 NOTE — Progress Notes (Signed)
Subjective:    Patient ID: Denise Watts, female    DOB: 12/23/1955, 59 y.o.   MRN: [id number] Chief Complaint  Patient presents with  . Hospitalization Follow-up    Right pelvis fracture    HPI This 59 year old female was staying with her sister (who has a bone marrow cancer secondary to lung cancer) and heard her sister try to get out of bed 12:30 am. This patient tried to get up to go help her and she fell on to her right side. Was take to [facility] and found to have pelvic fractures on the right, abrasions on the right elbow and contusion of the right ribs. Was referred to a Dr. Roland Rack (orthopedist - 02-02-15) and given Percocet for pain, Oscal with Vitamin-D, Lovenox and B-12 injection (per Dr. Oliva Bustard - oncologist).  Still having pain in right anterior ribs and right pelvis.   Past Medical History  Diagnosis Date  . Breast cancer   . Throat cancer   . Skin cancer   . 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 Diabetes Mother   . Heart attack Mother   . Lung cancer Sister   . Bone cancer Sister    Past Surgical History  Procedure Laterality Date  . Skin cancer excision    . Knee surgery Right   . Throat surgery  1998    throat cancer   . Breast lumpectomy Right 1997   Social History  Substance Use Topics  . Smoking status: Former Research scientist (life sciences)  . Smokeless tobacco: None  . Alcohol Use: No   No Known Allergies Current Outpatient Prescriptions on File Prior to Visit  Medication Sig Dispense Refill  . calcium-vitamin D (OSCAL WITH D) 500-200 MG-UNIT per tablet Take 1 tablet by mouth 2 (two) times daily. 60 tablet 1  . cyanocobalamin (,VITAMIN B-12,) 1000 MCG/ML injection Inject 1 mL (1,000 mcg total) into the muscle every 30 (thirty) days. for 6 months then once a year. 1 mL 5  . enoxaparin (LOVENOX) 40 MG/0.4ML injection Inject 0.4 mLs (40 mg total) into the skin daily. 14 Syringe 0  . meloxicam (MOBIC) 15 MG tablet Take by mouth.    .  Needles & Syringes MISC 1 needle and syringe every 30 days to use with b12 injections for 6 months. 1 each 5  . omeprazole (PRILOSEC) 40 MG capsule TAKE ONE (1) CAPSULE EACH DAY 30 capsule 3  . oxyCODONE-acetaminophen (PERCOCET/ROXICET) 5-325 MG per tablet Take 1 tablet by mouth every 4 (four) hours as needed for moderate pain or severe pain. 40 tablet 0   No current facility-administered medications on file prior to visit.    Review of Systems  Constitutional: Positive for activity change. Negative for appetite change.       Unable to stand or walk due to pelvic fracture.  HENT: Negative.   Eyes: Negative.   Respiratory: Negative.   Cardiovascular: Positive for chest pain.       Right rib pain secondary to fall.  Gastrointestinal: Positive for abdominal pain and constipation.       No BM since fall on 01-12-15. Lower abdominal/pelvic pain.  Genitourinary: Positive for urgency and frequency. Negative for dysuria and hematuria.  Musculoskeletal:       Pelvic, right anterior rib and right elbow pain.  Neurological: Negative for weakness and numbness.  Hematological: Bruises/bleeds easily.       Right elbow secondary to recent fall.  BP 116/80 mmHg  Pulse 78  Temp(Src) 97.7 F (36.5 C) (Oral)  Resp 20  Wt 138 lb (62.596 kg)  SpO2 96%  Objective:   Physical Exam  Constitutional: She is oriented to person, place, and time. She appears well-developed and well-nourished.  HENT:  Head: Normocephalic and atraumatic.  Eyes: Conjunctivae and EOM are normal.  Neck: Neck supple.  Cardiovascular: Normal rate and regular rhythm.   Pulmonary/Chest: Breath sounds normal. She exhibits tenderness.  Very tender along right lower anterior ribs. Also pain anteriorly to move the 9th rib posteriorly. Lungs clear.  Abdominal: Bowel sounds are normal. There is tenderness.  Very painful right pelvis to move right leg or cough.   Musculoskeletal:  Unable to stand, move the right leg and  extremely painful to palpate right lower abdomen.  Neurological: She is alert and oriented to person, place, and time.  Psychiatric: She has a normal mood and affect. Her behavior is normal. Thought content normal.      Assessment & Plan:  1. Pelvic fracture, closed, initial encounter Recent onset of right superior and inferior ramus fractures. Unable to walk due to severe pain. Will continue analgesic and encouraged to follow up with orthopedist. Will not be able to go back to work until he releases her. Her job requires standing and walking 8 hours a day. Recheck in 2 weeks as needed.  2. History of throat cancer Followed by Dr. Oliva Bustard. With recent fractures, will probably need BMD and evaluation for bony metastasis. Encouraged to continue follow up with oncologist.  3. Urinary frequency Recent onset but no gross hematuria. Normal dipstick evaluation today. Recommend she drink extra fluids to flush renal system and prevent constipation from narcotic analgesic. Recheck if any hematuria noted. - POCT urinalysis dipstick

## 2015-02-02 DIAGNOSIS — S2239XA Fracture of one rib, unspecified side, initial encounter for closed fracture: Secondary | ICD-10-CM | POA: Insufficient documentation

## 2015-02-16 ENCOUNTER — Ambulatory Visit
Admission: RE | Admit: 2015-02-16 | Discharge: 2015-02-16 | Disposition: A | Discharge status: Home / Self Care | Payer: PRIVATE HEALTH INSURANCE | Source: Ambulatory Visit | Attending: Oncology | Admitting: Oncology

## 2015-02-16 DIAGNOSIS — Z1231 Encounter for screening mammogram for malignant neoplasm of breast: Secondary | ICD-10-CM | POA: Insufficient documentation

## 2015-02-16 DIAGNOSIS — Z853 Personal history of malignant neoplasm of breast: Secondary | ICD-10-CM | POA: Diagnosis not present

## 2015-02-17 ENCOUNTER — Other Ambulatory Visit: Payer: Self-pay

## 2015-02-17 DIAGNOSIS — N63 Unspecified lump in unspecified breast: Secondary | ICD-10-CM

## 2015-02-18 ENCOUNTER — Inpatient Hospital Stay: Payer: PRIVATE HEALTH INSURANCE | Attending: Oncology

## 2015-02-18 ENCOUNTER — Encounter: Payer: Self-pay | Admitting: Oncology

## 2015-02-18 ENCOUNTER — Inpatient Hospital Stay (HOSPITAL_BASED_OUTPATIENT_CLINIC_OR_DEPARTMENT_OTHER): Payer: PRIVATE HEALTH INSURANCE | Admitting: Oncology

## 2015-02-18 VITALS — BP 133/78 | HR 67 | Temp 97.4°F | Ht 61.0 in | Wt 141.5 lb

## 2015-02-18 DIAGNOSIS — Z87891 Personal history of nicotine dependence: Secondary | ICD-10-CM | POA: Diagnosis not present

## 2015-02-18 DIAGNOSIS — Z853 Personal history of malignant neoplasm of breast: Secondary | ICD-10-CM | POA: Insufficient documentation

## 2015-02-18 DIAGNOSIS — R922 Inconclusive mammogram: Secondary | ICD-10-CM

## 2015-02-18 DIAGNOSIS — D709 Neutropenia, unspecified: Secondary | ICD-10-CM

## 2015-02-18 DIAGNOSIS — Z801 Family history of malignant neoplasm of trachea, bronchus and lung: Secondary | ICD-10-CM | POA: Insufficient documentation

## 2015-02-18 DIAGNOSIS — Z923 Personal history of irradiation: Secondary | ICD-10-CM | POA: Insufficient documentation

## 2015-02-18 DIAGNOSIS — Z85828 Personal history of other malignant neoplasm of skin: Secondary | ICD-10-CM | POA: Insufficient documentation

## 2015-02-18 DIAGNOSIS — Z79899 Other long term (current) drug therapy: Secondary | ICD-10-CM | POA: Insufficient documentation

## 2015-02-18 DIAGNOSIS — C320 Malignant neoplasm of glottis: Secondary | ICD-10-CM | POA: Insufficient documentation

## 2015-02-18 DIAGNOSIS — R918 Other nonspecific abnormal finding of lung field: Secondary | ICD-10-CM | POA: Diagnosis not present

## 2015-02-18 DIAGNOSIS — Z9181 History of falling: Secondary | ICD-10-CM

## 2015-02-18 DIAGNOSIS — Z8781 Personal history of (healed) traumatic fracture: Secondary | ICD-10-CM

## 2015-02-18 DIAGNOSIS — R911 Solitary pulmonary nodule: Secondary | ICD-10-CM

## 2015-02-18 LAB — COMPREHENSIVE METABOLIC PANEL
ALBUMIN: 4.2 g/dL (ref 3.5–5.0)
ALK PHOS: 83 U/L (ref 38–126)
ALT: 16 U/L (ref 14–54)
ANION GAP: 6 (ref 5–15)
AST: 25 U/L (ref 15–41)
BILIRUBIN TOTAL: 0.7 mg/dL (ref 0.3–1.2)
BUN: 10 mg/dL (ref 6–20)
CALCIUM: 9.1 mg/dL (ref 8.9–10.3)
CO2: 29 mmol/L (ref 22–32)
CREATININE: 0.8 mg/dL (ref 0.44–1.00)
Chloride: 103 mmol/L (ref 101–111)
GFR calc Af Amer: >60 mL/min (ref >60)
GFR calc non Af Amer: >60 mL/min (ref >60)
GLUCOSE: 109 mg/dL — AB (ref 65–99)
Potassium: 4.3 mmol/L (ref 3.5–5.1)
Sodium: 138 mmol/L (ref 135–145)
TOTAL PROTEIN: 7.1 g/dL (ref 6.5–8.1)

## 2015-02-18 LAB — CBC WITH DIFFERENTIAL/PLATELET
BASOS PCT: 1 %
Basophils Absolute: 0 10*3/uL (ref 0–0.1)
Eosinophils Absolute: 0.1 10*3/uL (ref 0–0.7)
Eosinophils Relative: 3 %
HEMATOCRIT: 44.7 % (ref 35.0–47.0)
HEMOGLOBIN: 15 g/dL (ref 12.0–16.0)
LYMPHS ABS: 1.2 10*3/uL (ref 1.0–3.6)
Lymphocytes Relative: 25 %
MCH: 31.8 pg (ref 26.0–34.0)
MCHC: 33.5 g/dL (ref 32.0–36.0)
MCV: 95.1 fL (ref 80.0–100.0)
MONOS PCT: 8 %
Monocytes Absolute: 0.4 10*3/uL (ref 0.2–0.9)
NEUTROS ABS: 3 10*3/uL (ref 1.4–6.5)
NEUTROS PCT: 65 %
Platelets: 188 10*3/uL (ref 150–440)
RBC: 4.7 MIL/uL (ref 3.80–5.20)
RDW: 11.8 % (ref 11.5–14.5)
WBC: 4.7 10*3/uL (ref 3.6–11.0)

## 2015-02-18 NOTE — Progress Notes (Signed)
Patient had mammogram on 10/17 is here for results of that test

## 2015-02-21 ENCOUNTER — Encounter: Payer: Self-pay | Admitting: Oncology

## 2015-02-21 NOTE — Progress Notes (Signed)
[place] @ [facility] Telephone:([phone]  Fax:([phone]) [place]: Jul 15, 1955  MR#: [id number]  XBD#:[id number]  Patient Care Team: Margo Common, PA as PCP - General (Physician Assistant)  CHIEF COMPLAINT:  Chief Complaint  Patient presents with  . Results    mammagram follow up    Oncology History   1.carcinoma of breast, T1, N0, M0 tumor diagnosis.  In January of 1997 2.  Carcinoma of the vocal cord T1, N0, M0 tumor.  Status postradiation therapy 3,abnormal CT scan of the chest (December, 2015) 4.  Repeat CT scan of chest shows stable nodule (July, 2016) 5.  Neutropenia with normal hemoglobin and normal platelet count (July, 2016)     Cancer of vocal cord [facility])   11/10/2014 Initial Diagnosis Cancer of vocal cord     INTERVAL HISTORY:  59 year old lady with a previous history of carcinoma breast as well as carcinoma vocal  cord had pulmonary nodule detected in CT scan of 2015 had a repeat CT scan.  No chills.  No fever.  No cough or shortness of breath.  Patient has stopped smoking. NO hemoptysis or chest pain Getting regular mammogram done Patient is here for further follow-up and treatment consideration.  Recently patient fell her left fracture in the pelvic bone walking with the help of walker.  Pain is gradually getting better. Recent mammogram which was abnormal is or discuss the results and further options REVIEW OF SYSTEMS:   GENERAL:  Feels good.  Active.  No fevers, sweats or weight loss. PERFORMANCE STATUS (ECOG):  0 HEENT:  No visual changes, runny nose, sore throat, mouth sores or tenderness. Lungs: No shortness of breath or cough.  No hemoptysis. Cardiac:  No chest pain, palpitations, orthopnea, or PND. GI:  No nausea, vomiting, diarrhea, constipation, melena or hematochezia. GU:  No urgency, frequency, dysuria, or hematuria. Musculoskeletal:  No back pain.  No joint pain.  No muscle tenderness. Extremities:  No pain or  swelling. Skin:  No rashes or skin changes. Neuro:  No headache, numbness or weakness, balance or coordination issues. Endocrine:  No diabetes, thyroid issues, hot flashes or night sweats. Psych:  No mood changes, depression or anxiety. Pain:  No focal pain. Review of systems:  All other systems reviewed and found to be negative. As per HPI. Otherwise, a complete review of systems is negatve.  PAST MEDICAL HISTORY: Past Medical History  Diagnosis Date  . Throat cancer ([place])   . Skin cancer   . 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[place]) 11/10/2014  . Breast cancer ([place]) 1997    right breast with radiation    PAST SURGICAL HISTORY: Past Surgical History  Procedure Laterality Date  . Skin cancer excision    . Knee surgery Right   . Throat surgery  1998    throat cancer   . Breast lumpectomy Right 1997  . Breast biopsy Right 1997    positive    FAMILY HISTORY Family History  Problem Relation Age of Onset  . Diabetes Mother   . Heart attack Mother   . Lung cancer Sister   . Bone cancer Sister   . Breast cancer Maternal Aunt 41    ADVANCED DIRECTIVES:  Patient does not have any living will or healthcare power of attorney.  Information was given .  Available resources had been discussed.  We will follow-up on subsequent appointments regarding this issue HEALTH MAINTENANCE: Social History  Substance Use Topics  . Smoking status: Former  Smoker  . Smokeless tobacco: None  . Alcohol Use: No      No Known Allergies  Current Outpatient Prescriptions  Medication Sig Dispense Refill  . calcium-vitamin D (OSCAL WITH D) 500-200 MG-UNIT per tablet Take 1 tablet by mouth 2 (two) times daily. 60 tablet 1  . cyanocobalamin (,VITAMIN B-12,) 1000 MCG/ML injection Inject 1 mL (1,000 mcg total) into the muscle every 30 (thirty) days. for 6 months then once a year. 1 mL 5  . Needles & Syringes MISC 1 needle and syringe every 30 days to use with b12 injections for 6 months. 1 each 5  . omeprazole  (PRILOSEC) 40 MG capsule TAKE ONE (1) CAPSULE EACH DAY 30 capsule 3  . oxyCODONE-acetaminophen (PERCOCET/ROXICET) 5-325 MG per tablet Take 1 tablet by mouth every 4 (four) hours as needed for moderate pain or severe pain. 40 tablet 0   No current facility-administered medications for this visit.    OBJECTIVE:  Filed Vitals:   02/18/15 1115  BP: 133/78  Pulse: 67  Temp: 97.4 F (36.3 C)     Body mass index is 26.76 kg/(m^2).    ECOG FS:0 - Asymptomatic  PHYSICAL EXAM: GENERAL:  Well developed, well nourished, sitting comfortably in the exam room in no acute distress. MENTAL STATUS:  Alert and oriented to person, place and time. HEAD: Normocephalic, atraumatic, face symmetric, no Cushingoid features. EYES:  .  Pupils equal round and reactive to light and accomodation.  No conjunctivitis or scleral icterus. ENT:  Oropharynx clear without lesion.  Tongue normal. Mucous membranes moist.  RESPIRATORY:  Clear to auscultation without rales, wheezes or rhonchi. CARDIOVASCULAR:  Regular rate and rhythm without murmur, rub or gallop.  ABDOMEN:  Soft, non-tender, with active bowel sounds, and no hepatosplenomegaly.  No masses. BACK:  No CVA tenderness.  No tenderness on percussion of the back or rib cage. SKIN:  No rashes, ulcers or lesions. EXTREMITIES: No edema, no skin discoloration or tenderness.  No palpable cords. LYMPH NODES: No palpable cervical, supraclavicular, axillary or inguinal adenopathy  NEUROLOGICAL: Unremarkable. PSYCH:  Appropriate.  Examination of both breasts.  I do not find any abnormality in the right breast patient's breast is dense.  Left breast no palpable mass or skin changes axillary nodes are not palpable.  LAB RESULTS:  Appointment on 02/18/2015  Component Date Value Ref Range Status  . WBC 02/18/2015 4.7  3.6 - 11.0 K/uL Final  . RBC 02/18/2015 4.70  3.80 - 5.20 MIL/uL Final  . Hemoglobin 02/18/2015 15.0  12.0 - 16.0 g/dL Final  . HCT 02/18/2015 44.7  35.0  - 47.0 % Final  . MCV 02/18/2015 95.1  80.0 - 100.0 fL Final  . MCH 02/18/2015 31.8  26.0 - 34.0 pg Final  . MCHC 02/18/2015 33.5  32.0 - 36.0 g/dL Final  . RDW 02/18/2015 11.8  11.5 - 14.5 % Final  . Platelets 02/18/2015 188  150 - 440 K/uL Final  . Neutrophils Relative % 02/18/2015 65   Final  . Neutro Abs 02/18/2015 3.0  1.4 - 6.5 K/uL Final  . Lymphocytes Relative 02/18/2015 25   Final  . Lymphs Abs 02/18/2015 1.2  1.0 - 3.6 K/uL Final  . Monocytes Relative 02/18/2015 8   Final  . Monocytes Absolute 02/18/2015 0.4  0.2 - 0.9 K/uL Final  . Eosinophils Relative 02/18/2015 3   Final  . Eosinophils Absolute 02/18/2015 0.1  0 - 0.7 K/uL Final  . Basophils Relative 02/18/2015 1   Final  .  Basophils Absolute 02/18/2015 0.0  0 - 0.1 K/uL Final  . Sodium 02/18/2015 138  135 - 145 mmol/L Final  . Potassium 02/18/2015 4.3  3.5 - 5.1 mmol/L Final  . Chloride 02/18/2015 103  101 - 111 mmol/L Final  . CO2 02/18/2015 29  22 - 32 mmol/L Final  . Glucose, Bld 02/18/2015 109* 65 - 99 mg/dL Final  . BUN 02/18/2015 10  6 - 20 mg/dL Final  . Creatinine, Ser 02/18/2015 0.80  0.44 - 1.00 mg/dL Final  . Calcium 02/18/2015 9.1  8.9 - 10.3 mg/dL Final  . Total Protein 02/18/2015 7.1  6.5 - 8.1 g/dL Final  . Albumin 02/18/2015 4.2  3.5 - 5.0 g/dL Final  . AST 02/18/2015 25  15 - 41 U/L Final  . ALT 02/18/2015 16  14 - 54 U/L Final  . Alkaline Phosphatase 02/18/2015 83  38 - 126 U/L Final  . Total Bilirubin 02/18/2015 0.7  0.3 - 1.2 mg/dL Final  . GFR calc non Af Amer 02/18/2015 >60  >60 mL/min Final  . GFR calc Af Amer 02/18/2015 >60  >60 mL/min Final   Comment: (NOTE) The eGFR has been calculated using the CKD EPI equation. This calculation has not been validated in all clinical situations. eGFR's persistently <60 mL/min signify possible Chronic Kidney Disease.   . Anion gap 02/18/2015 6  5 - 15 Final     STUDIES: Mm Digital Screening Bilateral  02/16/2015  CLINICAL DATA:  Screening. EXAM:  DIGITAL SCREENING BILATERAL MAMMOGRAM WITH CAD COMPARISON:  Previous exam(s). ACR Breast Density Category b: There are scattered areas of fibroglandular density. FINDINGS: In the left breast, a possible mass warrants further evaluation. In the right breast, no findings suspicious for malignancy. Images were processed with CAD. IMPRESSION: Further evaluation is suggested for possible mass in the left breast. RECOMMENDATION: Diagnostic mammogram and possibly ultrasound of the left breast. (Code:[id number]-66M) The patient will be contacted regarding the findings, and additional imaging will be scheduled. BI-RADS CATEGORY  0: Incomplete. Need additional imaging evaluation and/or prior mammograms for comparison. Electronically Signed   By: Lovey Newcomer M.D.   On: 02/16/2015 16:09    ASSESSMENT: Carcinoma of breast getting regular mammogram Carcinoma of the vocal cord Lung nodule.  Repeat CT scan shows stable lung nodule.  Yearly CT scan has been recommended Patient has been a former smoker  MEDICAL DECISION MAKING:  1.  Patient had a previous history of T1 N0 M0 carcinoma of the breast.  Recently had abnormal left breast mammogram with suspected mass.  Clinically mass is not palpable but patient's breast is very dense.  I will proceed with recommendation from radiology's of ultrasound and needle biopsy if needed I explained that to the patient and she is in agreement with it 2.  Neutropenia which is improved.  Patient had vitamin B12 deficiency and received vitamin B12 injection which would be continued once a year 3.  Pulmonary nodules stable does not require any further follow-up 4.  Recent pelvic fracture being evaluated by orthopedic surgeon All lab data has been reviewed.  Mammogram has been reviewed.  I discussed the situation with our breast navigator nurse is to proceed with biopsy and follow-up on the biopsy result.  A biopsy result is positive I would evaluate patient to discuss further options If   biopsy results are negative then routine follow-up mammogram as well as to see me in one year  Patient expressed understanding and was in agreement with this plan. She also  understands that She can call clinic at any time with any questions, concerns, or complaints.    No matching staging information was found for the patient.  Forest Gleason, MD   02/21/2015 3:35 PM

## 2015-02-27 ENCOUNTER — Telehealth: Payer: Self-pay

## 2015-02-27 MED ORDER — OMEPRAZOLE 40 MG PO CPDR: DELAYED_RELEASE_CAPSULE | ORAL | Status: DC

## 2015-02-27 NOTE — Telephone Encounter (Signed)
Refill request received from [facility] requesting Omeprazole 40 mg capsule.

## 2015-03-02 NOTE — Telephone Encounter (Signed)
Dennis sent RX to pharmacy.  

## 2015-03-06 ENCOUNTER — Ambulatory Visit
Admission: RE | Admit: 2015-03-06 | Discharge: 2015-03-06 | Disposition: A | Discharge status: Home / Self Care | Payer: PRIVATE HEALTH INSURANCE | Source: Ambulatory Visit | Attending: Oncology | Admitting: Oncology

## 2015-03-06 DIAGNOSIS — N63 Unspecified lump in unspecified breast: Secondary | ICD-10-CM

## 2015-03-06 DIAGNOSIS — R928 Other abnormal and inconclusive findings on diagnostic imaging of breast: Secondary | ICD-10-CM | POA: Diagnosis present

## 2015-06-26 ENCOUNTER — Telehealth: Payer: Self-pay

## 2015-06-26 DIAGNOSIS — K219 Gastro-esophageal reflux disease without esophagitis: Secondary | ICD-10-CM

## 2015-06-26 DIAGNOSIS — C320 Malignant neoplasm of glottis: Secondary | ICD-10-CM

## 2015-06-26 MED ORDER — OMEPRAZOLE 40 MG PO CPDR: DELAYED_RELEASE_CAPSULE | ORAL | Status: DC

## 2015-06-26 NOTE — Telephone Encounter (Signed)
Fax from [facility] for refill on Denise Watts

## 2015-08-19 ENCOUNTER — Inpatient Hospital Stay: Payer: PRIVATE HEALTH INSURANCE | Attending: Family Medicine | Admitting: Family Medicine

## 2015-08-19 ENCOUNTER — Inpatient Hospital Stay: Payer: PRIVATE HEALTH INSURANCE

## 2015-08-19 VITALS — BP 129/89 | HR 69 | Temp 98.1°F | Wt 151.5 lb

## 2015-08-19 DIAGNOSIS — Z79899 Other long term (current) drug therapy: Secondary | ICD-10-CM | POA: Insufficient documentation

## 2015-08-19 DIAGNOSIS — Z8521 Personal history of malignant neoplasm of larynx: Secondary | ICD-10-CM | POA: Diagnosis not present

## 2015-08-19 DIAGNOSIS — Z853 Personal history of malignant neoplasm of breast: Secondary | ICD-10-CM | POA: Diagnosis not present

## 2015-08-19 DIAGNOSIS — Z87891 Personal history of nicotine dependence: Secondary | ICD-10-CM | POA: Diagnosis not present

## 2015-08-19 DIAGNOSIS — R221 Localized swelling, mass and lump, neck: Secondary | ICD-10-CM | POA: Diagnosis not present

## 2015-08-19 DIAGNOSIS — C320 Malignant neoplasm of glottis: Secondary | ICD-10-CM

## 2015-08-19 DIAGNOSIS — N63 Unspecified lump in unspecified breast: Secondary | ICD-10-CM

## 2015-08-19 DIAGNOSIS — R1310 Dysphagia, unspecified: Secondary | ICD-10-CM | POA: Diagnosis not present

## 2015-08-19 DIAGNOSIS — R922 Inconclusive mammogram: Secondary | ICD-10-CM

## 2015-08-19 DIAGNOSIS — Z85828 Personal history of other malignant neoplasm of skin: Secondary | ICD-10-CM | POA: Diagnosis not present

## 2015-08-19 DIAGNOSIS — Z803 Family history of malignant neoplasm of breast: Secondary | ICD-10-CM | POA: Diagnosis not present

## 2015-08-19 DIAGNOSIS — R918 Other nonspecific abnormal finding of lung field: Secondary | ICD-10-CM | POA: Diagnosis not present

## 2015-08-19 DIAGNOSIS — Z801 Family history of malignant neoplasm of trachea, bronchus and lung: Secondary | ICD-10-CM | POA: Diagnosis not present

## 2015-08-19 LAB — COMPREHENSIVE METABOLIC PANEL
ALT: 30 U/L (ref 14–54)
ANION GAP: 3 — AB (ref 5–15)
AST: 40 U/L (ref 15–41)
Albumin: 4.2 g/dL (ref 3.5–5.0)
Alkaline Phosphatase: 60 U/L (ref 38–126)
BILIRUBIN TOTAL: 0.6 mg/dL (ref 0.3–1.2)
BUN: 11 mg/dL (ref 6–20)
CO2: 28 mmol/L (ref 22–32)
Calcium: 8.7 mg/dL — ABNORMAL LOW (ref 8.9–10.3)
Chloride: 106 mmol/L (ref 101–111)
Creatinine, Ser: 0.79 mg/dL (ref 0.44–1.00)
Glucose, Bld: 96 mg/dL (ref 65–99)
POTASSIUM: 4.3 mmol/L (ref 3.5–5.1)
Sodium: 137 mmol/L (ref 135–145)
TOTAL PROTEIN: 6.5 g/dL (ref 6.5–8.1)

## 2015-08-19 LAB — CBC WITH DIFFERENTIAL/PLATELET
Basophils Absolute: 0 10*3/uL (ref 0–0.1)
Basophils Relative: 1 %
Eosinophils Absolute: 0.1 10*3/uL (ref 0–0.7)
Eosinophils Relative: 3 %
HEMATOCRIT: 43.3 % (ref 35.0–47.0)
Hemoglobin: 14.8 g/dL (ref 12.0–16.0)
LYMPHS PCT: 21 %
Lymphs Abs: 0.9 10*3/uL — ABNORMAL LOW (ref 1.0–3.6)
MCH: 33.4 pg (ref 26.0–34.0)
MCHC: 34.3 g/dL (ref 32.0–36.0)
MCV: 97.6 fL (ref 80.0–100.0)
MONO ABS: 0.4 10*3/uL (ref 0.2–0.9)
MONOS PCT: 8 %
NEUTROS ABS: 2.9 10*3/uL (ref 1.4–6.5)
Neutrophils Relative %: 67 %
Platelets: 201 10*3/uL (ref 150–440)
RBC: 4.44 MIL/uL (ref 3.80–5.20)
RDW: 12.2 % (ref 11.5–14.5)
WBC: 4.4 10*3/uL (ref 3.6–11.0)

## 2015-08-19 NOTE — Progress Notes (Signed)
[place]  Telephone:(336) [phone]  Fax:([phone]     Denise Watts DOB: 23-Mar-1956  MR#: [id number]  AJO#:[id number]  Patient Care Team: Margo Common, PA as PCP - General (Physician Assistant)  CHIEF COMPLAINT:  Chief Complaint  Patient presents with  . Breast Mass    INTERVAL HISTORY:  Patient is here for further follow-up regarding a history of breast cancer from January 1997, carcinoma of the vocal cord from 1998, but more recently an abnormal CT scan of the chest in December 2015. She didn't have a repeat imaging in July 2016 that revealed stable nodules. Recommendation was for one year follow-up with CT scan which will be due in July 2017. Patient also needs a follow-up mammogram from an abnormality approximately 6 months ago which will be due in May. She overall reports feeling fairly well. She has noticed over the last month or so some increasing difficulty with swallowing and a very small nodule the midline in her neck. She states she has been choking on occasion with food. She denies any recent weight loss, shortness of breath, chest pain.  REVIEW OF SYSTEMS:   Review of Systems  Constitutional: Negative for fever, chills, weight loss, malaise/fatigue and diaphoresis.  HENT: Negative.        Difficulty swallowing  Eyes: Negative.   Respiratory: Negative for cough, hemoptysis, sputum production, shortness of breath and wheezing.   Cardiovascular: Negative for chest pain, palpitations, orthopnea, claudication, leg swelling and PND.  Gastrointestinal: Negative for heartburn, nausea, vomiting, abdominal pain, diarrhea, constipation, blood in stool and melena.  Genitourinary: Negative.   Musculoskeletal: Negative.   Skin: Negative.   Neurological: Negative for dizziness, tingling, focal weakness, seizures and weakness.  Endo/Heme/Allergies: Does not bruise/bleed easily.  Psychiatric/Behavioral: Negative for depression. The patient is not nervous/anxious  and does not have insomnia.     As per HPI. Otherwise, a complete review of systems is negatve.  ONCOLOGY HISTORY: Oncology History   1. Carcinoma of breast, T1, N0, M0 tumor diagnosis.  In January of 1997 2. Carcinoma of the vocal cord T1, N0, M0 tumor.  Status postradiation therapy 3. Abnormal CT scan of the chest (December, 2015) 4. Repeat CT scan of chest shows stable nodule (July, 2016) 5. Neutropenia with normal hemoglobin and normal platelet count (July, 2016)     Cancer of vocal cord [facility])   11/10/2014 Initial Diagnosis Cancer of vocal cord    PAST MEDICAL HISTORY: Past Medical History  Diagnosis Date  . Throat cancer ([place]) 1998  . Skin cancer   . 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[place]) 11/10/2014  . Breast cancer ([place]) 1997    right breast lumpectomy with radiation    PAST SURGICAL HISTORY: Past Surgical History  Procedure Laterality Date  . Skin cancer excision    . Knee surgery Right   . Throat surgery  1998    throat cancer   . Breast lumpectomy Right 1997  . Breast biopsy Right 1997    positive    FAMILY HISTORY Family History  Problem Relation Age of Onset  . Diabetes Mother   . Heart attack Mother   . Lung cancer Sister   . Bone cancer Sister   . Breast cancer Maternal Aunt 41    GYNECOLOGIC HISTORY:  No LMP recorded. Patient is postmenopausal.     ADVANCED DIRECTIVES:    HEALTH MAINTENANCE: Social History  Substance Use Topics  . Smoking status: Former Research scientist (life sciences)  . Smokeless tobacco: Not on file  .  Alcohol Use: No      Mammogram: November 2016  No Known Allergies  Current Outpatient Prescriptions  Medication Sig Dispense Refill  . calcium-vitamin D (OSCAL WITH D) 500-200 MG-UNIT per tablet Take 1 tablet by mouth 2 (two) times daily. 60 tablet 1  . cyanocobalamin (,VITAMIN B-12,) 1000 MCG/ML injection Inject 1 mL (1,000 mcg total) into the muscle every 30 (thirty) days. for 6 months then once a year. 1 mL 5  . omeprazole (PRILOSEC) 40 MG capsule  TAKE ONE (1) CAPSULE EACH DAY BY MOUTH 30 capsule 3  . oxyCODONE-acetaminophen (PERCOCET/ROXICET) 5-325 MG tablet      No current facility-administered medications for this visit.    OBJECTIVE: BP 129/89 mmHg  Pulse 69  Temp(Src) 98.1 F (36.7 C) (Oral)  Wt 151 lb 7.3 oz (68.7 kg)   Body mass index is 28.63 kg/(m^2).    ECOG FS:1 - Symptomatic but completely ambulatory  General: Well-developed, well-nourished, no acute distress. Eyes: Pink conjunctiva, anicteric sclera. HEENT: Normocephalic, moist mucous membranes, clear oropharnyx. Small subcentimeter firm nodule midline of neck. Lungs: Clear to auscultation bilaterally. Heart: Regular rate and rhythm. No rubs, murmurs, or gallops. Abdomen: Soft, nontender, nondistended. No organomegaly noted, normoactive bowel sounds. Musculoskeletal: No edema, cyanosis, or clubbing. Neuro: Alert, answering all questions appropriately. Cranial nerves grossly intact. Skin: No rashes or petechiae noted. Psych: Normal affect. Lymphatics: No cervical, clavicular, or axillary LAD.   LAB RESULTS:  Appointment on 08/19/2015  Component Date Value Ref Range Status  . WBC 08/19/2015 4.4  3.6 - 11.0 K/uL Final  . RBC 08/19/2015 4.44  3.80 - 5.20 MIL/uL Final  . Hemoglobin 08/19/2015 14.8  12.0 - 16.0 g/dL Final  . HCT 08/19/2015 43.3  35.0 - 47.0 % Final  . MCV 08/19/2015 97.6  80.0 - 100.0 fL Final  . MCH 08/19/2015 33.4  26.0 - 34.0 pg Final  . MCHC 08/19/2015 34.3  32.0 - 36.0 g/dL Final  . RDW 08/19/2015 12.2  11.5 - 14.5 % Final  . Platelets 08/19/2015 201  150 - 440 K/uL Final  . Neutrophils Relative % 08/19/2015 67   Final  . Neutro Abs 08/19/2015 2.9  1.4 - 6.5 K/uL Final  . Lymphocytes Relative 08/19/2015 21   Final  . Lymphs Abs 08/19/2015 0.9* 1.0 - 3.6 K/uL Final  . Monocytes Relative 08/19/2015 8   Final  . Monocytes Absolute 08/19/2015 0.4  0.2 - 0.9 K/uL Final  . Eosinophils Relative 08/19/2015 3   Final  . Eosinophils Absolute  08/19/2015 0.1  0 - 0.7 K/uL Final  . Basophils Relative 08/19/2015 1   Final  . Basophils Absolute 08/19/2015 0.0  0 - 0.1 K/uL Final  . Sodium 08/19/2015 137  135 - 145 mmol/L Final  . Potassium 08/19/2015 4.3  3.5 - 5.1 mmol/L Final  . Chloride 08/19/2015 106  101 - 111 mmol/L Final  . CO2 08/19/2015 28  22 - 32 mmol/L Final  . Glucose, Bld 08/19/2015 96  65 - 99 mg/dL Final  . BUN 08/19/2015 11  6 - 20 mg/dL Final  . Creatinine, Ser 08/19/2015 0.79  0.44 - 1.00 mg/dL Final  . Calcium 08/19/2015 8.7* 8.9 - 10.3 mg/dL Final  . Total Protein 08/19/2015 6.5  6.5 - 8.1 g/dL Final  . Albumin 08/19/2015 4.2  3.5 - 5.0 g/dL Final  . AST 08/19/2015 40  15 - 41 U/L Final  . ALT 08/19/2015 30  14 - 54 U/L Final  . Alkaline Phosphatase  08/19/2015 60  38 - 126 U/L Final  . Total Bilirubin 08/19/2015 0.6  0.3 - 1.2 mg/dL Final  . GFR calc non Af Amer 08/19/2015 >60  >60 mL/min Final  . GFR calc Af Amer 08/19/2015 >60  >60 mL/min Final   Comment: (NOTE) The eGFR has been calculated using the CKD EPI equation. This calculation has not been validated in all clinical situations. eGFR's persistently <60 mL/min signify possible Chronic Kidney Disease.   . Anion gap 08/19/2015 3* 5 - 15 Final    STUDIES: No results found.  ASSESSMENT:  History of breast cancer from 13. History of vocal cord cancer from 1998. Abnormal chest CT in 2015.  PLAN:   1. History of breast cancer. Patient had a previous history of T1 N0 M0 carcinoma of the breast. Recently had abnormal left breast mammogram with suspected mass. Mammogram and ultrasound were performed, radiology recommended a 6 month follow-up mammogram which is due in May. We'll proceed with scheduling. 2. History of vocal cord cancer. Patient has a new subcentimeter firm nodule midline of the neck/throat that has appeared over the last 1 month. She has not seen any ENT in many years. We'll refer her to Dr. Pryor Ochoa at [facility] for further  evaluation. 3. Abnormal CT scan from 2015. Pulmonary nodules are stable on last exam in 2016. There was recommendation from radiology to continue with yearly follow-up. CT scan of chest without contrast will be scheduled for July 2017 as recommended.  We'll schedule routine follow-up in approximately 6 months. Patient expressed understanding and was in agreement with this plan. She also understands that She can call clinic at any time with any questions, concerns, or complaints.   Dr. Oliva Bustard was available for consultation and review of plan of care for this patient.   Evlyn Kanner, NP   08/19/2015 1:19 PM

## 2015-08-20 ENCOUNTER — Ambulatory Visit: Payer: PRIVATE HEALTH INSURANCE

## 2015-09-10 ENCOUNTER — Other Ambulatory Visit: Payer: Self-pay | Admitting: Family Medicine

## 2015-09-10 ENCOUNTER — Ambulatory Visit
Admission: RE | Admit: 2015-09-10 | Discharge: 2015-09-10 | Disposition: A | Discharge status: Home / Self Care | Payer: PRIVATE HEALTH INSURANCE | Source: Ambulatory Visit | Attending: Family Medicine | Admitting: Family Medicine

## 2015-09-10 DIAGNOSIS — N63 Unspecified lump in unspecified breast: Secondary | ICD-10-CM

## 2015-10-15 ENCOUNTER — Other Ambulatory Visit: Payer: Self-pay

## 2015-10-15 MED ORDER — OMEPRAZOLE 40 MG PO CPDR: DELAYED_RELEASE_CAPSULE | ORAL | Status: DC

## 2015-10-15 NOTE — Telephone Encounter (Signed)
Pharmacy requesting refill.

## 2015-11-18 ENCOUNTER — Ambulatory Visit: Payer: PRIVATE HEALTH INSURANCE

## 2015-11-18 ENCOUNTER — Ambulatory Visit
Admission: RE | Admit: 2015-11-18 | Discharge: 2015-11-18 | Disposition: A | Discharge status: Home / Self Care | Payer: PRIVATE HEALTH INSURANCE | Source: Ambulatory Visit | Attending: Family Medicine | Admitting: Family Medicine

## 2015-11-18 DIAGNOSIS — I2510 Atherosclerotic heart disease of native coronary artery without angina pectoris: Secondary | ICD-10-CM | POA: Diagnosis not present

## 2015-11-18 DIAGNOSIS — I700 Atherosclerosis of aorta: Secondary | ICD-10-CM | POA: Diagnosis not present

## 2015-11-18 DIAGNOSIS — R911 Solitary pulmonary nodule: Secondary | ICD-10-CM | POA: Insufficient documentation

## 2015-11-18 DIAGNOSIS — R918 Other nonspecific abnormal finding of lung field: Secondary | ICD-10-CM | POA: Diagnosis present

## 2015-11-18 DIAGNOSIS — J439 Emphysema, unspecified: Secondary | ICD-10-CM | POA: Insufficient documentation

## 2015-12-04 ENCOUNTER — Telehealth: Payer: Self-pay | Admitting: *Deleted

## 2015-12-04 NOTE — Telephone Encounter (Signed)
I was sent a message from christina that pt has ct scan sch with contrast 8/8, she just had a ct scan by Magda Paganini 7/19 without contrast.  When I reviewed chart I found that she was seen by choksi on 12/2014 and he ordered pt to be seen in 6 months but to have scna in 1 year.  Then Magda Paganini NP saw her in April 2017 and ordered her scan 7/19.  Pt had scan on 7/19 and dr Mike Gip reviewed her scan and all spots seen is stable in comparison to scans done previously.  When pt comes in October she will see corcoran and pt will have f/u scna in 6 months which is rec: by radiology.  Awaiting call back from pt if that is ok.

## 2015-12-07 ENCOUNTER — Telehealth: Payer: Self-pay | Admitting: *Deleted

## 2015-12-07 NOTE — Telephone Encounter (Signed)
  Scan cancelled

## 2015-12-07 NOTE — Telephone Encounter (Signed)
There is an order which was placed in 12/2014 by [facility] for CT chest with and she just had CT Chest without done 11/18/15 ordered by L Herring. Does she need another one done, or was this an oversight?  11/18/15 CT Chest without  IMPRESSION: 1. The dominant 1.3 by 1.0 cm nodule in the superior segment left lower lobe is unchanged back through 2015. This lesion was not hypermetabolic on a PET-CT from 05/01/2014 a could represent a hamartoma. Given the history of malignancy, this may warrant further imaging follow up. 2. Subpleural peripheral 1.2 by 0.7 cm nodule in the left upper lobe, new from 04/08/2014, stable from 11/07/2014, warrants follow up in 6-12 months to exclude low grade adenocarcinoma. 3. New 3 mm apical posterior segment left upper lobe nodule, merits observation. 4. Several other nodules are not appreciably changed since 04/08/2014 and likely benign. 5. Emphysema. 6. Coronary, aortic arch, and branch vessel atherosclerotic vascular disease.

## 2015-12-07 NOTE — Telephone Encounter (Signed)
I had called pt on 8/4 explaining the 8/8 ct scan not needed since she had one 7/19. She never called me back so today I tried her again and no answer so I called her husband and he said she got the message and they were fine not having the ct since she just had one last month.  I told him that I will officially cancel it since I have heard from him today. I called sch. And asked for it to be cancelled since she just had on 7/19.  The original order for  It was 1 year ago.

## 2015-12-08 ENCOUNTER — Ambulatory Visit: Admission: RE | Admit: 2015-12-08 | Payer: No Typology Code available for payment source | Source: Ambulatory Visit

## 2015-12-10 ENCOUNTER — Ambulatory Visit: Payer: PRIVATE HEALTH INSURANCE | Admitting: Hematology and Oncology

## 2015-12-10 ENCOUNTER — Other Ambulatory Visit: Payer: No Typology Code available for payment source

## 2016-02-17 ENCOUNTER — Other Ambulatory Visit: Payer: Self-pay | Admitting: Hematology and Oncology

## 2016-02-17 ENCOUNTER — Encounter: Payer: Self-pay | Admitting: Hematology and Oncology

## 2016-02-17 ENCOUNTER — Inpatient Hospital Stay: Payer: PRIVATE HEALTH INSURANCE | Attending: Hematology and Oncology | Admitting: Hematology and Oncology

## 2016-02-17 ENCOUNTER — Inpatient Hospital Stay: Payer: PRIVATE HEALTH INSURANCE

## 2016-02-17 VITALS — BP 138/86 | HR 66 | Temp 97.4°F | Resp 6 | Wt 147.0 lb

## 2016-02-17 DIAGNOSIS — Z85828 Personal history of other malignant neoplasm of skin: Secondary | ICD-10-CM

## 2016-02-17 DIAGNOSIS — Z801 Family history of malignant neoplasm of trachea, bronchus and lung: Secondary | ICD-10-CM | POA: Diagnosis not present

## 2016-02-17 DIAGNOSIS — R918 Other nonspecific abnormal finding of lung field: Secondary | ICD-10-CM | POA: Diagnosis not present

## 2016-02-17 DIAGNOSIS — Z923 Personal history of irradiation: Secondary | ICD-10-CM | POA: Diagnosis not present

## 2016-02-17 DIAGNOSIS — N644 Mastodynia: Secondary | ICD-10-CM | POA: Diagnosis not present

## 2016-02-17 DIAGNOSIS — C320 Malignant neoplasm of glottis: Secondary | ICD-10-CM

## 2016-02-17 DIAGNOSIS — R5383 Other fatigue: Secondary | ICD-10-CM | POA: Diagnosis not present

## 2016-02-17 DIAGNOSIS — Z79899 Other long term (current) drug therapy: Secondary | ICD-10-CM

## 2016-02-17 DIAGNOSIS — R1310 Dysphagia, unspecified: Secondary | ICD-10-CM

## 2016-02-17 DIAGNOSIS — Z803 Family history of malignant neoplasm of breast: Secondary | ICD-10-CM

## 2016-02-17 DIAGNOSIS — Z8521 Personal history of malignant neoplasm of larynx: Secondary | ICD-10-CM

## 2016-02-17 DIAGNOSIS — D709 Neutropenia, unspecified: Secondary | ICD-10-CM

## 2016-02-17 DIAGNOSIS — R682 Dry mouth, unspecified: Secondary | ICD-10-CM | POA: Diagnosis not present

## 2016-02-17 DIAGNOSIS — Z87891 Personal history of nicotine dependence: Secondary | ICD-10-CM

## 2016-02-17 DIAGNOSIS — Z853 Personal history of malignant neoplasm of breast: Secondary | ICD-10-CM

## 2016-02-17 DIAGNOSIS — Z85819 Personal history of malignant neoplasm of unspecified site of lip, oral cavity, and pharynx: Secondary | ICD-10-CM

## 2016-02-17 DIAGNOSIS — R202 Paresthesia of skin: Secondary | ICD-10-CM

## 2016-02-17 DIAGNOSIS — G629 Polyneuropathy, unspecified: Secondary | ICD-10-CM | POA: Diagnosis not present

## 2016-02-17 DIAGNOSIS — Z808 Family history of malignant neoplasm of other organs or systems: Secondary | ICD-10-CM

## 2016-02-17 DIAGNOSIS — G609 Hereditary and idiopathic neuropathy, unspecified: Secondary | ICD-10-CM

## 2016-02-17 LAB — CBC WITH DIFFERENTIAL/PLATELET
BASOS ABS: 0 10*3/uL (ref 0–0.1)
Basophils Relative: 1 %
EOS ABS: 0.1 10*3/uL (ref 0–0.7)
Eosinophils Relative: 2 %
HCT: 43.5 % (ref 35.0–47.0)
HEMOGLOBIN: 15 g/dL (ref 12.0–16.0)
LYMPHS ABS: 1.1 10*3/uL (ref 1.0–3.6)
Lymphocytes Relative: 22 %
MCH: 34.1 pg — AB (ref 26.0–34.0)
MCHC: 34.4 g/dL (ref 32.0–36.0)
MCV: 99 fL (ref 80.0–100.0)
Monocytes Absolute: 0.5 10*3/uL (ref 0.2–0.9)
Monocytes Relative: 10 %
NEUTROS PCT: 65 %
Neutro Abs: 3.2 10*3/uL (ref 1.4–6.5)
PLATELETS: 195 10*3/uL (ref 150–440)
RBC: 4.4 MIL/uL (ref 3.80–5.20)
RDW: 12.4 % (ref 11.5–14.5)
WBC: 4.9 10*3/uL (ref 3.6–11.0)

## 2016-02-17 LAB — COMPREHENSIVE METABOLIC PANEL
ALBUMIN: 4.3 g/dL (ref 3.5–5.0)
ALK PHOS: 70 U/L (ref 38–126)
ALT: 33 U/L (ref 14–54)
AST: 50 U/L — AB (ref 15–41)
Anion gap: 9 (ref 5–15)
BUN: 12 mg/dL (ref 6–20)
CALCIUM: 9.1 mg/dL (ref 8.9–10.3)
CHLORIDE: 106 mmol/L (ref 101–111)
CO2: 26 mmol/L (ref 22–32)
CREATININE: 0.75 mg/dL (ref 0.44–1.00)
GFR calc Af Amer: >60 mL/min (ref >60)
GFR calc non Af Amer: >60 mL/min (ref >60)
GLUCOSE: 105 mg/dL — AB (ref 65–99)
Potassium: 4.4 mmol/L (ref 3.5–5.1)
SODIUM: 141 mmol/L (ref 135–145)
Total Bilirubin: 0.8 mg/dL (ref 0.3–1.2)
Total Protein: 6.9 g/dL (ref 6.5–8.1)

## 2016-02-17 LAB — TSH: TSH: 0.424 u[IU]/mL (ref 0.350–4.500)

## 2016-02-17 NOTE — Assessment & Plan Note (Signed)
She has chronic fatigue. As above, I will check TSH to exclude acquired hypothyroidism due to prior surgery and exposure to radiation to the head and neck region

## 2016-02-17 NOTE — Assessment & Plan Note (Signed)
Examination is benign. Continue screening mammogram. She is a long-term cancer survivor and we discussed potential discharge from the cancer center but the patient would like to continue long-term follow-up here I will bring her back next year for further follow-up

## 2016-02-17 NOTE — Progress Notes (Signed)
Patient is having right arm/hand pain with numbness/tingling feeling for the past 4 days.

## 2016-02-17 NOTE — Assessment & Plan Note (Signed)
I reviewed multiple CT imaging. She have chronic, persistent lung nodules and occasional new lung nodules. She is not symptomatic. She is not smoking. Per guidelines, I recommend repeat imaging study in 6 months to a year. The patient elected to wait for one year from recent CT imaging. I will order a CT scan of the chest to be repeated in July 2018, to be done before her return visit

## 2016-02-17 NOTE — Progress Notes (Signed)
[place] FOLLOW-UP progress notes  Patient Care Team: Margo Common, PA as PCP - General (Physician Assistant)  CHIEF COMPLAINTS/PURPOSE OF VISIT:  History of vocal cord cancer, breast cancer and lung nodules  HISTORY OF PRESENTING ILLNESS:  Denise Watts 60 y.o. female was transferred to my care after her prior physician has left.  I reviewed the patient's records extensive and collaborated the history with the patient. Summary of her history is as follows: Oncology History   1. Carcinoma of breast, T1, N0, M0 tumor diagnosis.  In January of 1997 2. Carcinoma of the vocal cord T1, N0, M0 tumor.  Status postradiation therapy 3. Abnormal CT scan of the chest (December, 2015) 4. Repeat CT scan of chest shows stable nodule (July, 2016) 5. Neutropenia with normal hemoglobin and normal platelet count (July, 2016)     Cancer of vocal cord [facility])   11/10/2014 Initial Diagnosis    Cancer of vocal cord      The patient was originally diagnosed with right breast cancer in 1997, status post surgery and radiation treatment only. She never received chemotherapy or adjuvant anti-estrogen therapy. In terms of her vocal cord cancer, she had extensive surgery and radiation therapy in 1998. Due to past history of smoking, she was also noted to have lung nodules. She is undergoing active surveillance imaging study of the chest. From the breast cancer standpoint, she denies any recent abnormal breast examination, palpable mass, abnormal breast appearance or nipple changes. She has chronic right breast pain near the surgical site and recently perceived mild numbness and tingling sensation on her right hand. She denies focal neurological deficits From the vocal cord cancer standpoint, she had permanent dry mouth and abnormal sensation around her neck. There is minimum limitation of movement around the neck. 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 She denies recent weight loss. No changes in her voice or hemoptysis. She has chronic dry mouth She complains of chronic fatigue. She has not have TSH checked since radiation therapy  MEDICAL HISTORY:  Past Medical History:  Diagnosis Date  . Breast cancer [facility]) 1997   right breast lumpectomy with radiation  . 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[place]) 11/10/2014  . Skin cancer   . Throat cancer ([place]) 1998    SURGICAL HISTORY: Past Surgical History:  Procedure Laterality Date  . BREAST BIOPSY Right 1997   positive  . BREAST LUMPECTOMY Right 1997  . KNEE SURGERY Right   . SKIN CANCER EXCISION    . THROAT SURGERY  1998   throat cancer     SOCIAL HISTORY: Social History   Social History  . Marital status: Married    Spouse name: N/A  . Number of children: N/A  . Years of education: N/A   Occupational History  . Not on file.   Social History Main Topics  . Smoking status: Former Research scientist (life sciences)  . Smokeless tobacco: Never Used  . Alcohol use No  . Drug use: No  . Sexual activity: Not on file   Other Topics Concern  . Not on file   Social History Narrative  . No narrative on file    FAMILY HISTORY: Family History  Problem Relation Age of Onset  . Diabetes Mother   . Heart attack Mother   . Lung cancer Sister   . Bone cancer Sister   . Breast cancer Maternal Aunt 41    ALLERGIES:  has No Known  Allergies.  MEDICATIONS:  Current Outpatient Prescriptions  Medication Sig Dispense Refill  . calcium-vitamin D (OSCAL WITH D) 500-200 MG-UNIT per tablet Take 1 tablet by mouth 2 (two) times daily. 60 tablet 1  . cyanocobalamin (,VITAMIN B-12,) 1000 MCG/ML injection Inject 1 mL (1,000 mcg total) into the muscle every 30 (thirty) days. for 6 months then once a year. 1 mL 5  . omeprazole (PRILOSEC) 40 MG capsule TAKE ONE (1) CAPSULE EACH DAY BY MOUTH 30 capsule 3  . oxyCODONE-acetaminophen (PERCOCET/ROXICET) 5-325 MG tablet      No  current facility-administered medications for this visit.     REVIEW OF SYSTEMS:   Constitutional: Denies fevers, chills or abnormal night sweats Eyes: Denies blurriness of vision, double vision or watery eyes Ears, nose, mouth, throat, and face: Denies mucositis or sore throat Respiratory: Denies cough, dyspnea or wheezes Cardiovascular: Denies palpitation, chest discomfort or lower extremity swelling Gastrointestinal:  Denies nausea, heartburn or change in bowel habits Skin: Denies abnormal skin rashes Lymphatics: Denies new lymphadenopathy or easy bruising Neurological:Denies numbness, tingling or new weaknesses Behavioral/Psych: Mood is stable, no new changes  All other systems were reviewed with the patient and are negative.  PHYSICAL EXAMINATION: ECOG PERFORMANCE STATUS: 1 - Symptomatic but completely ambulatory  Vitals:   02/17/16 1020  BP: 138/86  Pulse: 66  Resp: (!) 6  Temp: 97.4 F (36.3 C)   Filed Weights   02/17/16 1020  Weight: 147 lb 0.8 oz (66.7 kg)    GENERAL:alert, no distress and comfortable SKIN: skin color, texture, turgor are normal, no rashes or significant lesions EYES: normal, conjunctiva are pink and non-injected, sclera clear OROPHARYNX:no exudate, normal lips, buccal mucosa, and tongue  NECK: The neck is woody and fibrosed from prior surgery and radiation LYMPH:  no palpable lymphadenopathy in the cervical, axillary or inguinal LUNGS: clear to auscultation and percussion with normal breathing effort HEART: regular rate & rhythm and no murmurs without lower extremity edema ABDOMEN:abdomen soft, non-tender and normal bowel sounds Musculoskeletal:no cyanosis of digits and no clubbing  PSYCH: alert & oriented x 3 with fluent speech NEURO: no focal motor/sensory deficits Bilateral breast examination revealed no abnormalities suggestive of breast cancer recurrence  LABORATORY DATA:  I have reviewed the data as listed Lab Results  Component Value  Date   WBC 4.9 02/17/2016   HGB 15.0 02/17/2016   HCT 43.5 02/17/2016   MCV 99.0 02/17/2016   PLT 195 02/17/2016    Recent Labs  02/18/15 1023 08/19/15 0919 02/17/16 1003  NA 138 137 141  K 4.3 4.3 4.4  CL 103 106 106  CO2 29 28 26   GLUCOSE 109* 96 105*  BUN 10 11 12   CREATININE 0.80 0.79 0.75  CALCIUM 9.1 8.7* 9.1  GFRNONAA >60 >60 >60  GFRAA >60 >60 >60  PROT 7.1 6.5 6.9  ALBUMIN 4.2 4.2 4.3  AST 25 40 50*  ALT 16 30 33  ALKPHOS 83 60 70  BILITOT 0.7 0.6 0.8    RADIOGRAPHIC STUDIES:I reviewed recent CT imaging I have personally reviewed the radiological images as listed and agreed with the findings in the report.   ASSESSMENT & PLAN:  History of throat cancer The patient is a long-term cancer survivor. Examination of her neck is difficult due to significant scarring from prior radiation and surgery. She is not symptomatic. She does not have other risk factors such as chronic smoking or alcoholism. There is no role for routine surveillance imaging. If she has recurrence of  symptoms such as hoarseness or hemoptysis, I recommend close ENT follow-up. With prior exposure to radiation treatment, I recommend minimum and will TSH monitoring to exclude acquired hypothyroidism  History of right breast cancer Examination is benign. Continue screening mammogram. She is a long-term cancer survivor and we discussed potential discharge from the cancer center but the patient would like to continue long-term follow-up here I will bring her back next year for further follow-up  Fatigue She has chronic fatigue. As above, I will check TSH to exclude acquired hypothyroidism due to prior surgery and exposure to radiation to the head and neck region  Multiple lung nodules on CT I reviewed multiple CT imaging. She have chronic, persistent lung nodules and occasional new lung nodules. She is not symptomatic. She is not smoking. Per guidelines, I recommend repeat imaging study in 6  months to a year. The patient elected to wait for one year from recent CT imaging. I will order a CT scan of the chest to be repeated in July 2018, to be done before her return visit  Peripheral neuropathy ([place]) She has sensation of neuropathic pain on the right breast related to prior surgery as well as mild occasional tingling and numbing sensation on her right hand. I suspect this could be related to permanent damage from prior surgery and radiation, exacerbated by recent change in weather causing a more pronounced perceived sensation of neuropathy Otherwise, she demonstrated no signs of focal neurological deficits. I recommend observation only   Orders Placed This Encounter  Procedures  . CT CHEST WO CONTRAST    Standing Status:   Future    Standing Expiration Date:   03/23/2017    Order Specific Question:   Reason for exam:    Answer:   lung nodules and hx breast ca    Order Specific Question:   Preferred imaging location?    Answer:   [facility]  . TSH    Standing Status:   Future    Number of Occurrences:   1    Standing Expiration Date:   03/23/2017  . CBC with Differential/Platelet    Standing Status:   Future    Standing Expiration Date:   03/23/2017  . Comprehensive metabolic panel    Standing Status:   Future    Standing Expiration Date:   03/23/2017  . TSH    Standing Status:   Future    Standing Expiration Date:   03/23/2017    All questions were answered. The patient knows to call the clinic with any problems, questions or concerns. I spent 20 minutes counseling the patient face to face. The total time spent in the appointment was 30 minutes and more than 50% was on counseling.     Heath Lark, MD 02/17/2016 11:21 AM

## 2016-02-17 NOTE — Assessment & Plan Note (Signed)
She has sensation of neuropathic pain on the right breast related to prior surgery as well as mild occasional tingling and numbing sensation on her right hand. I suspect this could be related to permanent damage from prior surgery and radiation, exacerbated by recent change in weather causing a more pronounced perceived sensation of neuropathy Otherwise, she demonstrated no signs of focal neurological deficits. I recommend observation only

## 2016-02-17 NOTE — Assessment & Plan Note (Signed)
The patient is a long-term cancer survivor. Examination of her neck is difficult due to significant scarring from prior radiation and surgery. She is not symptomatic. She does not have other risk factors such as chronic smoking or alcoholism. There is no role for routine surveillance imaging. If she has recurrence of symptoms such as hoarseness or hemoptysis, I recommend close ENT follow-up. With prior exposure to radiation treatment, I recommend minimum and will TSH monitoring to exclude acquired hypothyroidism

## 2016-02-18 ENCOUNTER — Ambulatory Visit: Payer: PRIVATE HEALTH INSURANCE | Admitting: Hematology and Oncology

## 2016-02-18 ENCOUNTER — Other Ambulatory Visit: Payer: PRIVATE HEALTH INSURANCE

## 2016-08-24 ENCOUNTER — Telehealth: Payer: Self-pay | Admitting: *Deleted

## 2016-08-24 DIAGNOSIS — R928 Other abnormal and inconclusive findings on diagnostic imaging of breast: Secondary | ICD-10-CM

## 2016-08-24 DIAGNOSIS — Z853 Personal history of malignant neoplasm of breast: Secondary | ICD-10-CM

## 2016-08-24 NOTE — Telephone Encounter (Signed)
Needs an appt for a mammogram. Will see Dr Janese Banks for 1st time in July last seen by Dr Alvy Bimler in October. PLease schedule and call pt with appt

## 2016-08-25 NOTE — Addendum Note (Signed)
Addended by: Betti Cruz on: 08/25/2016 11:44 AM   Modules accepted: Orders

## 2016-08-25 NOTE — Telephone Encounter (Signed)
Can you please order this?

## 2016-08-25 NOTE — Telephone Encounter (Signed)
Order entered and message sent to scheduling 

## 2016-09-15 ENCOUNTER — Ambulatory Visit
Admission: RE | Admit: 2016-09-15 | Discharge: 2016-09-15 | Disposition: A | Discharge status: Home / Self Care | Payer: PRIVATE HEALTH INSURANCE | Source: Ambulatory Visit | Attending: Oncology | Admitting: Oncology

## 2016-09-15 DIAGNOSIS — Z853 Personal history of malignant neoplasm of breast: Secondary | ICD-10-CM

## 2016-09-15 DIAGNOSIS — N6321 Unspecified lump in the left breast, upper outer quadrant: Secondary | ICD-10-CM | POA: Insufficient documentation

## 2016-09-15 DIAGNOSIS — R928 Other abnormal and inconclusive findings on diagnostic imaging of breast: Secondary | ICD-10-CM

## 2016-09-19 ENCOUNTER — Other Ambulatory Visit: Payer: Self-pay | Admitting: Family Medicine

## 2016-09-19 NOTE — Telephone Encounter (Signed)
This is Dennis's pt. Last refill was 10/15/2015. Renaldo Fiddler, CMA

## 2016-11-14 ENCOUNTER — Ambulatory Visit
Admission: RE | Admit: 2016-11-14 | Discharge: 2016-11-14 | Disposition: A | Discharge status: Home / Self Care | Payer: PRIVATE HEALTH INSURANCE | Source: Ambulatory Visit | Attending: Hematology and Oncology | Admitting: Hematology and Oncology

## 2016-11-14 ENCOUNTER — Inpatient Hospital Stay: Payer: PRIVATE HEALTH INSURANCE

## 2016-11-14 DIAGNOSIS — R918 Other nonspecific abnormal finding of lung field: Secondary | ICD-10-CM | POA: Diagnosis present

## 2016-11-14 DIAGNOSIS — Z803 Family history of malignant neoplasm of breast: Secondary | ICD-10-CM | POA: Diagnosis not present

## 2016-11-14 DIAGNOSIS — D3500 Benign neoplasm of unspecified adrenal gland: Secondary | ICD-10-CM | POA: Diagnosis not present

## 2016-11-14 DIAGNOSIS — Z8521 Personal history of malignant neoplasm of larynx: Secondary | ICD-10-CM | POA: Insufficient documentation

## 2016-11-14 DIAGNOSIS — Z87891 Personal history of nicotine dependence: Secondary | ICD-10-CM | POA: Insufficient documentation

## 2016-11-14 DIAGNOSIS — E279 Disorder of adrenal gland, unspecified: Secondary | ICD-10-CM | POA: Diagnosis not present

## 2016-11-14 DIAGNOSIS — R42 Dizziness and giddiness: Secondary | ICD-10-CM | POA: Insufficient documentation

## 2016-11-14 DIAGNOSIS — D709 Neutropenia, unspecified: Secondary | ICD-10-CM | POA: Diagnosis not present

## 2016-11-14 DIAGNOSIS — J439 Emphysema, unspecified: Secondary | ICD-10-CM | POA: Insufficient documentation

## 2016-11-14 DIAGNOSIS — Z808 Family history of malignant neoplasm of other organs or systems: Secondary | ICD-10-CM | POA: Insufficient documentation

## 2016-11-14 DIAGNOSIS — K760 Fatty (change of) liver, not elsewhere classified: Secondary | ICD-10-CM | POA: Insufficient documentation

## 2016-11-14 DIAGNOSIS — I2510 Atherosclerotic heart disease of native coronary artery without angina pectoris: Secondary | ICD-10-CM | POA: Insufficient documentation

## 2016-11-14 DIAGNOSIS — Z853 Personal history of malignant neoplasm of breast: Secondary | ICD-10-CM | POA: Diagnosis not present

## 2016-11-14 DIAGNOSIS — Z801 Family history of malignant neoplasm of trachea, bronchus and lung: Secondary | ICD-10-CM | POA: Diagnosis not present

## 2016-11-14 DIAGNOSIS — Z79899 Other long term (current) drug therapy: Secondary | ICD-10-CM | POA: Diagnosis not present

## 2016-11-14 DIAGNOSIS — I700 Atherosclerosis of aorta: Secondary | ICD-10-CM | POA: Insufficient documentation

## 2016-11-14 DIAGNOSIS — Z85828 Personal history of other malignant neoplasm of skin: Secondary | ICD-10-CM | POA: Insufficient documentation

## 2016-11-14 DIAGNOSIS — Z923 Personal history of irradiation: Secondary | ICD-10-CM | POA: Diagnosis not present

## 2016-11-14 DIAGNOSIS — R5383 Other fatigue: Secondary | ICD-10-CM

## 2016-11-14 DIAGNOSIS — C320 Malignant neoplasm of glottis: Secondary | ICD-10-CM

## 2016-11-14 LAB — COMPREHENSIVE METABOLIC PANEL
ALT: 27 U/L (ref 14–54)
ANION GAP: 5 (ref 5–15)
AST: 49 U/L — ABNORMAL HIGH (ref 15–41)
Albumin: 3.9 g/dL (ref 3.5–5.0)
Alkaline Phosphatase: 76 U/L (ref 38–126)
BUN: 10 mg/dL (ref 6–20)
CHLORIDE: 107 mmol/L (ref 101–111)
CO2: 31 mmol/L (ref 22–32)
CREATININE: 0.77 mg/dL (ref 0.44–1.00)
Calcium: 9.3 mg/dL (ref 8.9–10.3)
Glucose, Bld: 104 mg/dL — ABNORMAL HIGH (ref 65–99)
POTASSIUM: 4.3 mmol/L (ref 3.5–5.1)
SODIUM: 143 mmol/L (ref 135–145)
Total Bilirubin: 0.6 mg/dL (ref 0.3–1.2)
Total Protein: 7.1 g/dL (ref 6.5–8.1)

## 2016-11-14 LAB — TSH: TSH: 0.398 u[IU]/mL (ref 0.350–4.500)

## 2016-11-14 LAB — CBC WITH DIFFERENTIAL/PLATELET
Basophils Absolute: 0 10*3/uL (ref 0–0.1)
Basophils Relative: 1 %
EOS PCT: 3 %
Eosinophils Absolute: 0.2 10*3/uL (ref 0–0.7)
HCT: 42.7 % (ref 35.0–47.0)
Hemoglobin: 15 g/dL (ref 12.0–16.0)
LYMPHS ABS: 1.1 10*3/uL (ref 1.0–3.6)
Lymphocytes Relative: 18 %
MCH: 34.7 pg — AB (ref 26.0–34.0)
MCHC: 35.2 g/dL (ref 32.0–36.0)
MCV: 98.7 fL (ref 80.0–100.0)
MONO ABS: 0.6 10*3/uL (ref 0.2–0.9)
Monocytes Relative: 10 %
Neutro Abs: 4.1 10*3/uL (ref 1.4–6.5)
Neutrophils Relative %: 68 %
PLATELETS: 239 10*3/uL (ref 150–440)
RBC: 4.32 MIL/uL (ref 3.80–5.20)
RDW: 12.3 % (ref 11.5–14.5)
WBC: 6 10*3/uL (ref 3.6–11.0)

## 2016-11-15 ENCOUNTER — Inpatient Hospital Stay: Payer: PRIVATE HEALTH INSURANCE | Attending: Oncology | Admitting: Oncology

## 2016-11-15 VITALS — BP 128/83 | HR 81 | Temp 97.4°F | Resp 18 | Wt 139.3 lb

## 2016-11-15 DIAGNOSIS — Z803 Family history of malignant neoplasm of breast: Secondary | ICD-10-CM

## 2016-11-15 DIAGNOSIS — Z79899 Other long term (current) drug therapy: Secondary | ICD-10-CM

## 2016-11-15 DIAGNOSIS — I700 Atherosclerosis of aorta: Secondary | ICD-10-CM | POA: Diagnosis not present

## 2016-11-15 DIAGNOSIS — Z85828 Personal history of other malignant neoplasm of skin: Secondary | ICD-10-CM

## 2016-11-15 DIAGNOSIS — Z808 Family history of malignant neoplasm of other organs or systems: Secondary | ICD-10-CM

## 2016-11-15 DIAGNOSIS — E279 Disorder of adrenal gland, unspecified: Secondary | ICD-10-CM | POA: Diagnosis not present

## 2016-11-15 DIAGNOSIS — Z87891 Personal history of nicotine dependence: Secondary | ICD-10-CM | POA: Diagnosis not present

## 2016-11-15 DIAGNOSIS — Z923 Personal history of irradiation: Secondary | ICD-10-CM

## 2016-11-15 DIAGNOSIS — R918 Other nonspecific abnormal finding of lung field: Secondary | ICD-10-CM | POA: Diagnosis not present

## 2016-11-15 DIAGNOSIS — Z801 Family history of malignant neoplasm of trachea, bronchus and lung: Secondary | ICD-10-CM | POA: Diagnosis not present

## 2016-11-15 DIAGNOSIS — J439 Emphysema, unspecified: Secondary | ICD-10-CM

## 2016-11-15 DIAGNOSIS — Z853 Personal history of malignant neoplasm of breast: Secondary | ICD-10-CM | POA: Diagnosis not present

## 2016-11-15 DIAGNOSIS — C320 Malignant neoplasm of glottis: Secondary | ICD-10-CM

## 2016-11-15 DIAGNOSIS — K760 Fatty (change of) liver, not elsewhere classified: Secondary | ICD-10-CM

## 2016-11-15 DIAGNOSIS — Z8521 Personal history of malignant neoplasm of larynx: Secondary | ICD-10-CM | POA: Diagnosis not present

## 2016-11-15 DIAGNOSIS — R9389 Abnormal findings on diagnostic imaging of other specified body structures: Secondary | ICD-10-CM

## 2016-11-15 NOTE — Progress Notes (Signed)
Here for follow up

## 2016-11-16 ENCOUNTER — Ambulatory Visit: Payer: PRIVATE HEALTH INSURANCE

## 2016-11-17 ENCOUNTER — Encounter: Payer: Self-pay | Admitting: Oncology

## 2016-11-17 NOTE — Progress Notes (Signed)
Hematology/Oncology Consult note [facility]  [phone] [phone]  Patient Care Team: Chrismon, Vickki Muff, PA as PCP - General (Physician Assistant)   Name of the patient: Denise Watts  [id number]  1956/03/29   Date of visit: 11/17/16  Diagnosis- 1. H/o breast cancer in 1997 2. H/o stage I SCC of vocal cord in 2015. 3. Lung nodules  Chief complaint/ Reason for visit- routine f/u. Discuss scan results  Heme/Onc history:  Oncology History   1. Carcinoma of breast, T1, N0, M0 tumor diagnosis.  In January of 1997 2. Carcinoma of the vocal cord T1, N0, M0 tumor.  Status postradiation therapy 3. Abnormal CT scan of the chest (December, 2015) 4. Repeat CT scan of chest shows stable nodule (July, 2016) 5. Neutropenia with normal hemoglobin and normal platelet count (July, 2016)     Cancer of vocal cord [facility])   11/10/2014 Initial Diagnosis    Cancer of vocal cord      The patient was originally diagnosed with right breast cancer in 1997, status post surgery and radiation treatment only. She never received chemotherapy or hormone therapy. For her vocal cord cancer, she had extensive surgery and radiation therapy in 1998. Due to past history of smoking, she was also noted to have lung nodules. She is undergoing active surveillance imaging study of the chest.  Interval history- doing well. Denies any complaints. Denies any fatigue or unintentional weight loss  ECOG PS- 0 Pain scale- 0   Review of systems- Review of Systems  Constitutional: Negative for chills, fever, malaise/fatigue and weight loss.  HENT: Negative for congestion, ear discharge and nosebleeds.   Eyes: Negative for blurred vision.  Respiratory: Negative for cough, hemoptysis, sputum production, shortness of breath and wheezing.   Cardiovascular: Negative for chest pain, palpitations, orthopnea and claudication.  Gastrointestinal: Negative for abdominal pain, blood in  stool, constipation, diarrhea, heartburn, melena, nausea and vomiting.  Genitourinary: Negative for dysuria, flank pain, frequency, hematuria and urgency.  Musculoskeletal: Negative for back pain, joint pain and myalgias.  Skin: Negative for rash.  Neurological: Negative for dizziness, tingling, focal weakness, seizures, weakness and headaches.  Endo/Heme/Allergies: Does not bruise/bleed easily.  Psychiatric/Behavioral: Negative for depression and suicidal ideas. The patient does not have insomnia.      No Known Allergies   Past Medical History:  Diagnosis Date  . Breast cancer [facility]) 1997   right breast lumpectomy with radiation  . 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[place]) 11/10/2014  . Skin cancer   . Throat cancer ([place]) 1998     Past Surgical History:  Procedure Laterality Date  . BREAST BIOPSY Right 1997   positive  . BREAST LUMPECTOMY Right 1997  . KNEE SURGERY Right   . SKIN CANCER EXCISION    . THROAT SURGERY  1998   throat cancer     Social History   Social History  . Marital status: Married    Spouse name: N/A  . Number of children: N/A  . Years of education: N/A   Occupational History  . Not on file.   Social History Main Topics  . Smoking status: Former Research scientist (life sciences)  . Smokeless tobacco: Never Used  . Alcohol use No  . Drug use: No  . Sexual activity: Not on file   Other Topics Concern  . Not on file   Social History Narrative  . No narrative on file    Family History  Problem Relation Age of Onset  . Diabetes Mother   .  Heart attack Mother   . Lung cancer Sister   . Bone cancer Sister   . Breast cancer Maternal Aunt 41     Current Outpatient Prescriptions:  .  omeprazole (PRILOSEC) 40 MG capsule, TAKE 1 CAPSULE BY MOUTH EVERY DAY., Disp: 30 capsule, Rfl: 11 .  calcium-vitamin D (OSCAL WITH D) 500-200 MG-UNIT per tablet, Take 1 tablet by mouth 2 (two) times daily. (Patient not taking: Reported on 11/15/2016), Disp: 60 tablet, Rfl: 1 .  cyanocobalamin  (,VITAMIN B-12,) 1000 MCG/ML injection, Inject 1 mL (1,000 mcg total) into the muscle every 30 (thirty) days. for 6 months then once a year. (Patient not taking: Reported on 11/15/2016), Disp: 1 mL, Rfl: 5 .  naproxen sodium (ANAPROX) 220 MG tablet, Take 220 mg by mouth., Disp: , Rfl:  .  oxyCODONE-acetaminophen (PERCOCET/ROXICET) 5-325 MG tablet, , Disp: , Rfl:   Physical exam:  Vitals:   11/15/16 1035  BP: 128/83  Pulse: 81  Resp: 18  Temp: (!) 97.4 F (36.3 C)  TempSrc: Tympanic  Weight: 139 lb 4.8 oz (63.2 kg)   Physical Exam  Constitutional: She is oriented to person, place, and time and well-developed, well-nourished, and in no distress.  HENT:  Head: Normocephalic and atraumatic.  Eyes: Pupils are equal, round, and reactive to light. EOM are normal.  Neck: Normal range of motion.  Cardiovascular: Normal rate, regular rhythm and normal heart sounds.   Pulmonary/Chest: Effort normal and breath sounds normal.  Abdominal: Soft. Bowel sounds are normal.  Neurological: She is alert and oriented to person, place, and time.  Skin: Skin is warm and dry.     CMP Latest Ref Rng & Units 11/14/2016  Glucose 65 - 99 mg/dL 104(H)  BUN 6 - 20 mg/dL 10  Creatinine 0.44 - 1.00 mg/dL 0.77  Sodium 135 - 145 mmol/L 143  Potassium 3.5 - 5.1 mmol/L 4.3  Chloride 101 - 111 mmol/L 107  CO2 22 - 32 mmol/L 31  Calcium 8.9 - 10.3 mg/dL 9.3  Total Protein 6.5 - 8.1 g/dL 7.1  Total Bilirubin 0.3 - 1.2 mg/dL 0.6  Alkaline Phos 38 - 126 U/L 76  AST 15 - 41 U/L 49(H)  ALT 14 - 54 U/L 27   CBC Latest Ref Rng & Units 11/14/2016  WBC 3.6 - 11.0 K/uL 6.0  Hemoglobin 12.0 - 16.0 g/dL 15.0  Hematocrit 35.0 - 47.0 % 42.7  Platelets 150 - 440 K/uL 239    No images are attached to the encounter.  Ct Chest Wo Contrast  Result Date: 11/14/2016 CLINICAL DATA:  Followup chest CT.  History of throat cancer. EXAM: CT CHEST WITHOUT CONTRAST TECHNIQUE: Multidetector CT imaging of the chest was performed  following the standard protocol without IV contrast. COMPARISON:  11/18/2015 FINDINGS: Cardiovascular: Normal heart size. No pericardial effusion. Aortic atherosclerosis noted. Calcification within the RCA and LAD coronary artery noted. Mediastinum/Nodes: The trachea appears patent and is midline. Normal appearance of the esophagus. No mediastinal or hilar adenopathy. No axillary or supraclavicular adenopathy. Lungs/Pleura: No pleural effusion. Moderate changes of centrilobular emphysema identified. Pulmonary nodule within the superior segment of left lower lobe is unchanged measuring 1.3 cm, image 58 of series 3. The ground-glass attenuating nodule within the lateral aspect of the left upper lobe measures 0.9 x 1.4 cm, image 61 of series 3. This is increased from 1.3 x 0.7 cm previously. Small irregular nodule within the right lower lobe measures 6 mm and is stable from prior exam. Subpleural ground-glass attenuating  nodule within the lateral right lower lobe measures 1 cm, image 80 of series 3. Previously 0.9 cm. 1 cm linear configured nodular density within the lateral right upper lobe is not significantly changed from previous exam. Upper Abdomen: Hepatic steatosis is identified. There is a nodule within the right adrenal gland which measures 5.2 cm, image 139 of series 2. This appears new from 11/18/15. Musculoskeletal: No chest wall mass or suspicious bone lesions identified. IMPRESSION: 1. The solid nodule within the superior segment of left lower lobe is unchanged when compared with previous exam. 2. Left upper lobe ground-glass attenuating nodule continues to increase in size. Cannot rule out small low-grade pulmonary adenocarcinoma. The ground-glass attenuating nodule within the right lower lobe is not significantly changed in the interval. 3. New large nodule within the right adrenal gland is suspected. Not identified on previous exams. Findings are worrisome for metastatic disease. Suggest further  evaluation with PET-CT. 4. Aortic Atherosclerosis (ICD10-I70.0) and Emphysema (ICD10-J43.9). Multi vessel coronary artery calcification. Electronically Signed   By: Kerby Moors M.D.   On: 11/14/2016 10:42     Assessment and plan- Patient is a 61 y.o. female with following issues:  1. Stage I breast cancer in 1997- mammogram from may 2018 did not reveal any malignancy. Benign left breast mass  2. H/o vocal cord cancer- s/p radiation. Continue to monitor  3. Lung nodules- discussed results of CT scan. Reviewed images independently as well. LUL nodule has increased in size. LLL nodule stable. She also has a new adrenal nodule which is concerning for malignancy. At this time I will proceed with PET/CT for better assessment of both lung and adrenal nodules. I will discuss her case at the tumor board and then discuss further management options with the patient. Patient is in agreement with the plan   Visit Diagnosis 1. Abnormal CT of the chest   2. Cancer of vocal cord [facility])      Dr. Randa Evens, MD, MPH [facility] at [facility] Pager- [phone] 11/17/2016 8:09 AM

## 2016-11-21 ENCOUNTER — Ambulatory Visit
Admission: RE | Admit: 2016-11-21 | Discharge: 2016-11-21 | Disposition: A | Discharge status: Home / Self Care | Payer: PRIVATE HEALTH INSURANCE | Source: Ambulatory Visit | Attending: Oncology | Admitting: Oncology

## 2016-11-21 DIAGNOSIS — M899 Disorder of bone, unspecified: Secondary | ICD-10-CM | POA: Diagnosis not present

## 2016-11-21 DIAGNOSIS — Z8521 Personal history of malignant neoplasm of larynx: Secondary | ICD-10-CM | POA: Insufficient documentation

## 2016-11-21 DIAGNOSIS — E279 Disorder of adrenal gland, unspecified: Secondary | ICD-10-CM | POA: Insufficient documentation

## 2016-11-21 DIAGNOSIS — I2510 Atherosclerotic heart disease of native coronary artery without angina pectoris: Secondary | ICD-10-CM | POA: Diagnosis not present

## 2016-11-21 DIAGNOSIS — R918 Other nonspecific abnormal finding of lung field: Secondary | ICD-10-CM | POA: Insufficient documentation

## 2016-11-21 DIAGNOSIS — I700 Atherosclerosis of aorta: Secondary | ICD-10-CM | POA: Insufficient documentation

## 2016-11-21 DIAGNOSIS — R938 Abnormal findings on diagnostic imaging of other specified body structures: Secondary | ICD-10-CM | POA: Diagnosis present

## 2016-11-21 DIAGNOSIS — K760 Fatty (change of) liver, not elsewhere classified: Secondary | ICD-10-CM | POA: Insufficient documentation

## 2016-11-21 DIAGNOSIS — Z853 Personal history of malignant neoplasm of breast: Secondary | ICD-10-CM | POA: Diagnosis not present

## 2016-11-21 DIAGNOSIS — R9389 Abnormal findings on diagnostic imaging of other specified body structures: Secondary | ICD-10-CM

## 2016-11-21 LAB — GLUCOSE, CAPILLARY: Glucose-Capillary: 89 mg/dL (ref 65–99)

## 2016-11-21 MED ORDER — FLUDEOXYGLUCOSE F - 18 (FDG) INJECTION
12.0000 | Freq: Once | INTRAVENOUS | Status: AC | PRN
Start: 2016-11-21 — End: 2016-11-21
  Administered 2016-11-21: 12.8 via INTRAVENOUS

## 2016-11-24 ENCOUNTER — Inpatient Hospital Stay: Payer: PRIVATE HEALTH INSURANCE

## 2016-11-24 ENCOUNTER — Other Ambulatory Visit: Payer: Self-pay | Admitting: *Deleted

## 2016-11-24 ENCOUNTER — Inpatient Hospital Stay (HOSPITAL_BASED_OUTPATIENT_CLINIC_OR_DEPARTMENT_OTHER): Payer: PRIVATE HEALTH INSURANCE | Admitting: Oncology

## 2016-11-24 ENCOUNTER — Encounter: Payer: Self-pay | Admitting: Oncology

## 2016-11-24 VITALS — BP 128/86 | HR 96 | Temp 98.6°F | Resp 16 | Ht 61.0 in | Wt 139.4 lb

## 2016-11-24 DIAGNOSIS — Z87891 Personal history of nicotine dependence: Secondary | ICD-10-CM

## 2016-11-24 DIAGNOSIS — E278 Other specified disorders of adrenal gland: Secondary | ICD-10-CM

## 2016-11-24 DIAGNOSIS — K760 Fatty (change of) liver, not elsewhere classified: Secondary | ICD-10-CM

## 2016-11-24 DIAGNOSIS — Z8521 Personal history of malignant neoplasm of larynx: Secondary | ICD-10-CM | POA: Diagnosis not present

## 2016-11-24 DIAGNOSIS — I700 Atherosclerosis of aorta: Secondary | ICD-10-CM | POA: Diagnosis not present

## 2016-11-24 DIAGNOSIS — E279 Disorder of adrenal gland, unspecified: Secondary | ICD-10-CM | POA: Diagnosis not present

## 2016-11-24 DIAGNOSIS — Z808 Family history of malignant neoplasm of other organs or systems: Secondary | ICD-10-CM

## 2016-11-24 DIAGNOSIS — Z803 Family history of malignant neoplasm of breast: Secondary | ICD-10-CM

## 2016-11-24 DIAGNOSIS — D709 Neutropenia, unspecified: Secondary | ICD-10-CM | POA: Diagnosis not present

## 2016-11-24 DIAGNOSIS — R42 Dizziness and giddiness: Secondary | ICD-10-CM

## 2016-11-24 DIAGNOSIS — Z85828 Personal history of other malignant neoplasm of skin: Secondary | ICD-10-CM

## 2016-11-24 DIAGNOSIS — Z801 Family history of malignant neoplasm of trachea, bronchus and lung: Secondary | ICD-10-CM

## 2016-11-24 DIAGNOSIS — R918 Other nonspecific abnormal finding of lung field: Secondary | ICD-10-CM

## 2016-11-24 DIAGNOSIS — Z853 Personal history of malignant neoplasm of breast: Secondary | ICD-10-CM

## 2016-11-24 DIAGNOSIS — Z79899 Other long term (current) drug therapy: Secondary | ICD-10-CM

## 2016-11-24 DIAGNOSIS — Z923 Personal history of irradiation: Secondary | ICD-10-CM

## 2016-11-24 DIAGNOSIS — J439 Emphysema, unspecified: Secondary | ICD-10-CM

## 2016-11-24 NOTE — Progress Notes (Signed)
Patient here for results. She states she feels very shaky today and has experienced "dizzines).

## 2016-11-25 NOTE — Progress Notes (Signed)
Hematology/Oncology Consult note [facility]  Telephone:([phone] Fax:([phone]  Patient Care Team: Chrismon, Vickki Muff, PA as PCP - General (Physician Assistant)   Name of the patient: Denise Watts  [id number]  Mar 01, 1956   Date of visit: 11/25/16  Diagnosis- 1. H/o breast cancer in 1997 2. H/o stage I SCC of vocal cord in 2015. 3. Lung nodules 4. Adrenal mass  Chief complaint/ Reason for visit- discuss pet ct results  Heme/Onc history:  Oncology History   1. Carcinoma of breast, T1, N0, M0 tumor diagnosis.  In January of 1997 2. Carcinoma of the vocal cord T1, N0, M0 tumor.  Status postradiation therapy 3. Abnormal CT scan of the chest (December, 2015) 4. Repeat CT scan of chest shows stable nodule (July, 2016) 5. Neutropenia with normal hemoglobin and normal platelet count (July, 2016)     Cancer of vocal cord [facility])   11/10/2014 Initial Diagnosis    Cancer of vocal cord      The patient was originally diagnosed with right breast cancer in 1997, status post surgery and radiation treatment only. She never received chemotherapy or hormone therapy. For her vocal cord cancer, she had extensive surgery and radiation therapy in 1998. Due to past history of smoking, she was also noted to have lung nodules. She is undergoing active surveillance imaging study of the chest.   Interval history- reports occasional feeling of heart racing and light headedness. Reports that she has not pased out and her BP has never been high or uncontrolled  ECOG PS- 0 Pain scale- 0   Review of systems- Review of Systems  Constitutional: Negative for chills, fever, malaise/fatigue and weight loss.  HENT: Negative for congestion, ear discharge and nosebleeds.   Eyes: Negative for blurred vision.  Respiratory: Negative for cough, hemoptysis, sputum production, shortness of breath and wheezing.   Cardiovascular: Negative for chest pain, palpitations, orthopnea and  claudication.  Gastrointestinal: Negative for abdominal pain, blood in stool, constipation, diarrhea, heartburn, melena, nausea and vomiting.  Genitourinary: Negative for dysuria, flank pain, frequency, hematuria and urgency.  Musculoskeletal: Negative for back pain, joint pain and myalgias.  Skin: Negative for rash.  Neurological: Negative for dizziness, tingling, focal weakness, seizures, weakness and headaches.  Endo/Heme/Allergies: Does not bruise/bleed easily.  Psychiatric/Behavioral: Negative for depression and suicidal ideas. The patient does not have insomnia.       No Known Allergies   Past Medical History:  Diagnosis Date  . Breast cancer [facility]) 1997   right breast lumpectomy with radiation  . 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[place]) 11/10/2014  . Skin cancer   . Throat cancer ([place]) 1998     Past Surgical History:  Procedure Laterality Date  . BREAST BIOPSY Right 1997   positive  . BREAST LUMPECTOMY Right 1997  . KNEE SURGERY Right   . SKIN CANCER EXCISION    . THROAT SURGERY  1998   throat cancer     Social History   Social History  . Marital status: Married    Spouse name: N/A  . Number of children: N/A  . Years of education: N/A   Occupational History  . Not on file.   Social History Main Topics  . Smoking status: Former Research scientist (life sciences)  . Smokeless tobacco: Never Used  . Alcohol use No  . Drug use: No  . Sexual activity: Not on file   Other Topics Concern  . Not on file   Social History Narrative  . No narrative  on file    Family History  Problem Relation Age of Onset  . Diabetes Mother   . Heart attack Mother   . Lung cancer Sister   . Bone cancer Sister   . Breast cancer Maternal Aunt 41     Current Outpatient Prescriptions:  .  calcium-vitamin D (OSCAL WITH D) 500-200 MG-UNIT per tablet, Take 1 tablet by mouth 2 (two) times daily., Disp: 60 tablet, Rfl: 1 .  naproxen sodium (ANAPROX) 220 MG tablet, Take 220 mg by mouth., Disp: , Rfl:  .  omeprazole  (PRILOSEC) 40 MG capsule, TAKE 1 CAPSULE BY MOUTH EVERY DAY., Disp: 30 capsule, Rfl: 11 .  oxyCODONE-acetaminophen (PERCOCET/ROXICET) 5-325 MG tablet, , Disp: , Rfl:  .  cyanocobalamin (,VITAMIN B-12,) 1000 MCG/ML injection, Inject 1 mL (1,000 mcg total) into the muscle every 30 (thirty) days. for 6 months then once a year. (Patient not taking: Reported on 11/24/2016), Disp: 1 mL, Rfl: 5  Physical exam:  Vitals:   11/24/16 1520 11/24/16 1524  BP:  128/86  Pulse:  96  Resp:  16  Temp:  98.6 F (37 C)  TempSrc:  Tympanic  Weight: 139 lb 6.4 oz (63.2 kg)   Height: 5\' 1"  (1.549 m)    Physical Exam  Constitutional: She is oriented to person, place, and time and well-developed, well-nourished, and in no distress.  HENT:  Head: Normocephalic and atraumatic.  Eyes: Pupils are equal, round, and reactive to light. EOM are normal.  Neck: Normal range of motion.  Cardiovascular: Normal rate, regular rhythm and normal heart sounds.   Pulmonary/Chest: Effort normal and breath sounds normal.  Abdominal: Soft. Bowel sounds are normal.  Neurological: She is alert and oriented to person, place, and time.  Skin: Skin is warm and dry.     CMP Latest Ref Rng & Units 11/14/2016  Glucose 65 - 99 mg/dL 104(H)  BUN 6 - 20 mg/dL 10  Creatinine 0.44 - 1.00 mg/dL 0.77  Sodium 135 - 145 mmol/L 143  Potassium 3.5 - 5.1 mmol/L 4.3  Chloride 101 - 111 mmol/L 107  CO2 22 - 32 mmol/L 31  Calcium 8.9 - 10.3 mg/dL 9.3  Total Protein 6.5 - 8.1 g/dL 7.1  Total Bilirubin 0.3 - 1.2 mg/dL 0.6  Alkaline Phos 38 - 126 U/L 76  AST 15 - 41 U/L 49(H)  ALT 14 - 54 U/L 27   CBC Latest Ref Rng & Units 11/14/2016  WBC 3.6 - 11.0 K/uL 6.0  Hemoglobin 12.0 - 16.0 g/dL 15.0  Hematocrit 35.0 - 47.0 % 42.7  Platelets 150 - 440 K/uL 239    No images are attached to the encounter.  Ct Chest Wo Contrast  Result Date: 11/14/2016 CLINICAL DATA:  Followup chest CT.  History of throat cancer. EXAM: CT CHEST WITHOUT CONTRAST  TECHNIQUE: Multidetector CT imaging of the chest was performed following the standard protocol without IV contrast. COMPARISON:  11/18/2015 FINDINGS: Cardiovascular: Normal heart size. No pericardial effusion. Aortic atherosclerosis noted. Calcification within the RCA and LAD coronary artery noted. Mediastinum/Nodes: The trachea appears patent and is midline. Normal appearance of the esophagus. No mediastinal or hilar adenopathy. No axillary or supraclavicular adenopathy. Lungs/Pleura: No pleural effusion. Moderate changes of centrilobular emphysema identified. Pulmonary nodule within the superior segment of left lower lobe is unchanged measuring 1.3 cm, image 58 of series 3. The ground-glass attenuating nodule within the lateral aspect of the left upper lobe measures 0.9 x 1.4 cm, image 61 of series 3. This  is increased from 1.3 x 0.7 cm previously. Small irregular nodule within the right lower lobe measures 6 mm and is stable from prior exam. Subpleural ground-glass attenuating nodule within the lateral right lower lobe measures 1 cm, image 80 of series 3. Previously 0.9 cm. 1 cm linear configured nodular density within the lateral right upper lobe is not significantly changed from previous exam. Upper Abdomen: Hepatic steatosis is identified. There is a nodule within the right adrenal gland which measures 5.2 cm, image 139 of series 2. This appears new from 11/18/15. Musculoskeletal: No chest wall mass or suspicious bone lesions identified. IMPRESSION: 1. The solid nodule within the superior segment of left lower lobe is unchanged when compared with previous exam. 2. Left upper lobe ground-glass attenuating nodule continues to increase in size. Cannot rule out small low-grade pulmonary adenocarcinoma. The ground-glass attenuating nodule within the right lower lobe is not significantly changed in the interval. 3. New large nodule within the right adrenal gland is suspected. Not identified on previous exams.  Findings are worrisome for metastatic disease. Suggest further evaluation with PET-CT. 4. Aortic Atherosclerosis (ICD10-I70.0) and Emphysema (ICD10-J43.9). Multi vessel coronary artery calcification. Electronically Signed   By: Kerby Moors M.D.   On: 11/14/2016 10:42   Nm Pet Image Initial (pi) Skull Base To Thigh  Result Date: 11/21/2016 CLINICAL DATA:  Initial Treatment strategy for Lung and adrenal nodules on CT. Remote history of breast cancer and head neck cancer. EXAM: NUCLEAR MEDICINE PET SKULL BASE TO THIGH TECHNIQUE: 12.8 mCi F-18 FDG was injected intravenously. Full-ring PET imaging was performed from the skull base to thigh after the radiotracer. CT data was obtained and used for attenuation correction and anatomic localization. FASTING BLOOD GLUCOSE:  Value: 89 mg/dl COMPARISON:  Chest CT 11/14/2016 FINDINGS: NECK No areas of abnormal hypermetabolism. No cervical adenopathy. Bilateral carotid atherosclerosis. CHEST The solid left lower lobe pulmonary nodule is not significantly hypermetabolic. This measures 1.3 cm and a S.U.V. max of 1.3 on image 84/series 3. Left upper lobe ground-glass and right upper lobe solid pulmonary nodules are also not significantly hypermetabolic. No thoracic nodal hypermetabolism. Tiny hiatal hernia. Coronary artery atherosclerosis. ABDOMEN/PELVIS Right adrenal mass measures 3.5 x 4.7 cm. This demonstrates hypermetabolism along its periphery, primarily posteriorly and medially. This measures a S.U.V. max of 5.5. This lesion is new since prior chest CTs including 11/18/2015. No abdominopelvic nodal hypermetabolism. Abdominal aortic atherosclerosis. Moderate hepatic steatosis. Colonic diverticulosis. SKELETON Hypermetabolism involving the left anterior sixth rib measures a S.U.V. max of 2.7 on image 129/series 3. Subtle deformity in this area is likely related to remote trauma. IMPRESSION: 1. Right adrenal mass with peripheral hypermetabolism. Given interval development  since 11/18/2015, suspicious for either isolated metastasis or a primary adrenal neoplasm. This should be considered for biopsy. 2. No evidence of hypermetabolic primary malignancy or extraadrenal metastasis. Pulmonary nodules are not significantly hypermetabolic. 3. Coronary artery atherosclerosis. Aortic Atherosclerosis (ICD10-I70.0). 4. Hepatic steatosis. 5. Left sixth rib hypermetabolism is favored to be related to remote trauma. Electronically Signed   By: Abigail Miyamoto M.D.   On: 11/21/2016 11:05     Assessment and plan- Patient is a 61 y.o. female with following issues  1. H/o breast cancer and vocal cord cancer- in remission. Continue to monitor  2. Lung nodules- PET CT images reviewed at tumor board today. LLL nodule has been stable compared to prior scans and not hypermetabolic. LUL and RUL ground glass nodules were not hypermetabolic. Continue to f/u with repeat ct chest in  6 months  3. Right adrenal mass- this is new as compared to priort CT chest from a year before. Etiology of this is unclear. Since there is no other evidence of malignancy, plan was to proceed with resection of right adrenal gland and I will refer her to Dr. Erlene Quan from Urology for the same. It would be important to rule out pheochromocytoma prior to rescetion. I will order plasma metanephrines as initial screening test for the same and refer her to endocrinology for further assessment of possible pheochromocytoma. No high BP recorded so far. Patient gives some vague h/o light headedness and palpitations. Based on history, it is difficult to ascertain if these are symptoms of pheochromocytoma  I will see the patient back tentaively in 6 weeks time after she sees endocrinology, urology and resection of adrenal mass to discuss further management   Visit Diagnosis 1. Adrenal mass ([place])      Dr. Randa Evens, MD, MPH [facility] at [facility] Pager- [phone] 11/25/2016 8:05 AM

## 2016-11-27 LAB — MISC LABCORP TEST (SEND OUT): LABCORP TEST CODE: 121806

## 2016-12-16 ENCOUNTER — Ambulatory Visit (INDEPENDENT_AMBULATORY_CARE_PROVIDER_SITE_OTHER): Payer: PRIVATE HEALTH INSURANCE | Admitting: Urology

## 2016-12-16 ENCOUNTER — Encounter: Payer: Self-pay | Admitting: Urology

## 2016-12-16 DIAGNOSIS — E279 Disorder of adrenal gland, unspecified: Secondary | ICD-10-CM

## 2016-12-16 DIAGNOSIS — E278 Other specified disorders of adrenal gland: Secondary | ICD-10-CM | POA: Insufficient documentation

## 2016-12-16 LAB — URINALYSIS, COMPLETE
Bilirubin, UA: NEGATIVE
GLUCOSE, UA: NEGATIVE
Ketones, UA: NEGATIVE
Leukocytes, UA: NEGATIVE
NITRITE UA: NEGATIVE
Protein, UA: NEGATIVE
RBC, UA: NEGATIVE
Specific Gravity, UA: 1.015 (ref 1.005–1.030)
Urobilinogen, Ur: 0.2 mg/dL (ref 0.2–1.0)
pH, UA: 7.5 (ref 5.0–7.5)

## 2016-12-16 LAB — MICROSCOPIC EXAMINATION
RBC MICROSCOPIC, UA: NONE SEEN /HPF (ref 0–?)
WBC, UA: NONE SEEN /hpf (ref 0–?)

## 2016-12-16 NOTE — Progress Notes (Signed)
12/16/2016 6:54 AM   Denise Watts 05-22-1955 [id number]  Referring provider: Margo Common, [place], [place] [address]  CC: new patient, right adrenal mass  HPI:  1. RIGHT Adrenal Mass  - 4.7 cm right adrenal mass by PET- CT 10/2016 incidental on surveillance of h/o throat and breast CA. Mass is very mild PET avid, solitary, with preserved fat planes around. CMP and metanephrines normal. This is likely new since 2017 (not seen on chest CT at that time). Ipsilateral kidney appears 1 artery / 1 vein and UNinvolved.   PMH sig fro breast CA (NED x years), throat CA (NED x years), lung nodules (non-pet avid, observed). No CV disesae / blood thinners.  Her PCP is Dr. Luvenia Heller.   Today "Denise Watts" is seen as new patient for right adrenal mass.    PMH: Past Medical History:  Diagnosis Date  . Breast cancer [facility]) 1997   right breast lumpectomy with radiation  . 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 Skin cancer   . Throat cancer [facility]) 1998    Surgical History: Past Surgical History:  Procedure Laterality Date  . BREAST BIOPSY Right 1997   positive  . BREAST LUMPECTOMY Right 1997  . KNEE SURGERY Right   . SKIN CANCER EXCISION    . THROAT SURGERY  1998   throat cancer     Home Medications:  Allergies as of 12/16/2016   No Known Allergies     Medication List       Accurate as of 12/16/16  6:54 AM. Always use your most recent med list.          calcium-vitamin D 500-200 MG-UNIT tablet Commonly known as:  OSCAL WITH D Take 1 tablet by mouth 2 (two) times daily.   cyanocobalamin 1000 MCG/ML injection Commonly known as:  (VITAMIN B-12) Inject 1 mL (1,000 mcg total) into the muscle every 30 (thirty) days. for 6 months then once a year.   naproxen sodium 220 MG tablet Commonly known as:  ANAPROX Take 220 mg by mouth.   omeprazole 40 MG capsule Commonly known as:  PRILOSEC TAKE 1 CAPSULE BY MOUTH EVERY DAY.   oxyCODONE-acetaminophen 5-325 MG  tablet Commonly known as:  PERCOCET/ROXICET       Allergies: No Known Allergies  Family History: Family History  Problem Relation Age of Onset  . Diabetes Mother   . Heart attack Mother   . Lung cancer Sister   . Bone cancer Sister   . Breast cancer Maternal Aunt 19    Social History:  reports that she has quit smoking. She has never used smokeless tobacco. She reports that she does not drink alcohol or use drugs.   Review of Systems  Gastrointestinal (upper)  : Negative for upper GI symptoms  Gastrointestinal (lower) : Negative for lower GI symptoms  Constitutional : Negative for symptoms  Skin: Negative for skin symptoms  Eyes: Negative for eye symptoms  Ear/Nose/Throat : Negative for Ear/Nose/Throat symptoms  Hematologic/Lymphatic: Negative for Hematologic/Lymphatic symptoms  Cardiovascular : Negative for cardiovascular symptoms  Respiratory : Negative for respiratory symptoms  Endocrine: Negative for endocrine symptoms  Musculoskeletal: Negative for musculoskeletal symptoms  Neurological: Negative for neurological symptoms  Psychologic: Negative for psychiatric symptoms   Physical Exam: There were no vitals taken for this visit.  Constitutional:  Alert and oriented, No acute distress. HEENT: [place] AT, moist mucus membranes.  Trachea midline, no masses. Cardiovascular: No clubbing, cyanosis, or edema. Respiratory: Normal respiratory  effort, no increased work of breathing. GI: Abdomen is soft, nontender, nondistended, no abdominal masses GU: No CVA tenderness.  Skin: No rashes, bruises or suspicious lesions. Lymph: No cervical or inguinal adenopathy. Neurologic: Grossly intact, no focal deficits, moving all 4 extremities. Psychiatric: Normal mood and affect.  Laboratory Data: Lab Results  Component Value Date   WBC 6.0 11/14/2016   HGB 15.0 11/14/2016   HCT 42.7 11/14/2016   MCV 98.7 11/14/2016   PLT 239 11/14/2016    Lab Results    Component Value Date   CREATININE 0.77 11/14/2016    No results found for: PSA  No results found for: TESTOSTERONE  No results found for: HGBA1C  Urinalysis    Component Value Date/Time   BILIRUBINUR neg 01/16/2015 1041   PROTEINUR neg 01/16/2015 1041   UROBILINOGEN 0.2 01/16/2015 1041   NITRITE neg 01/16/2015 1041   LEUKOCYTESUR Negative 01/16/2015 1041    Pertinent Imaging: PET ct reviwed as per HPI  Assessment & Plan:    1. RIGHT Adrenal Mass - size >4cm concerning for possible primary cancer or met. Non-functional by serum studies. Rec RIGHT robotic adrenalectomy with goal of complete removal. This will allows best histologic confirmation and treatment in single step.   Risks, benefits, alternatives (observation, biopsy), expected peri-op course discussed.    Alexis Frock, [facility] [address] [place], [place] [address] [phone]

## 2016-12-21 ENCOUNTER — Telehealth: Payer: Self-pay | Admitting: Radiology

## 2016-12-21 ENCOUNTER — Other Ambulatory Visit: Payer: Self-pay | Admitting: Radiology

## 2016-12-21 DIAGNOSIS — E278 Other specified disorders of adrenal gland: Secondary | ICD-10-CM

## 2016-12-21 NOTE — Telephone Encounter (Signed)
Pt scheduled for right robotic adrenalectomy with Dr Erlene Quan on 12/28/16. Made pt aware of surgery date, pre-admit testing appt & to call day prior to surgery for arrival time to [facility]. Questions answered. Pt voices understanding.

## 2016-12-22 ENCOUNTER — Encounter
Admission: RE | Admit: 2016-12-22 | Discharge: 2016-12-22 | Disposition: A | Discharge status: Home / Self Care | Payer: PRIVATE HEALTH INSURANCE | Source: Ambulatory Visit | Attending: Urology | Admitting: Urology

## 2016-12-22 DIAGNOSIS — Z01818 Encounter for other preprocedural examination: Secondary | ICD-10-CM | POA: Diagnosis present

## 2016-12-22 DIAGNOSIS — R079 Chest pain, unspecified: Secondary | ICD-10-CM | POA: Insufficient documentation

## 2016-12-22 DIAGNOSIS — R9431 Abnormal electrocardiogram [ECG] [EKG]: Secondary | ICD-10-CM | POA: Diagnosis not present

## 2016-12-22 HISTORY — DX: Personal history of other diseases of the digestive system: Z87.19

## 2016-12-22 HISTORY — DX: Gastro-esophageal reflux disease without esophagitis: K21.9

## 2016-12-22 LAB — TYPE AND SCREEN
ABO/RH(D): O NEG
Antibody Screen: NEGATIVE

## 2016-12-22 NOTE — Pre-Procedure Instructions (Addendum)
AS INSTRUCTED BY DR Donata Duff, REQUEST AND EKG FAXED TO [place].  Also, CALLED AND FAXED TO AMY AT DR Cherrie [email or web address] WITH [facility]Y

## 2016-12-22 NOTE — Patient Instructions (Signed)
Your procedure is scheduled on: Wednesday 12/28/16 Report to [place]. 2ND FLOOR MEDICAL MALL ENTRANCE. To find out your arrival time please call [phone] between 1PM - 3PM on Tuesday 12/27/16.  Remember: Instructions that are not followed completely may result in serious medical risk, up to and including death, or upon the discretion of your surgeon and anesthesiologist your surgery may need to be rescheduled.    __X__ 1. Do not eat food or drink liquids after midnight. No gum chewing or hard candies.     __X__ 2. No Alcohol for 24 hours before or after surgery.   ____ 3. Bring all medications with you on the day of surgery if instructed.    __X__ 4. Notify your doctor if there is any change in your medical condition     (cold, fever, infections).             __X___5. No smoking within 24 hours of your surgery.     Do not wear jewelry, make-up, hairpins, clips or nail polish.  Do not wear lotions, powders, or perfumes.   Do not shave 48 hours prior to surgery. Men may shave face and neck.  Do not bring valuables to the hospital.    [facility] is not responsible for any belongings or valuables.               Contacts, dentures or bridgework may not be worn into surgery.  Leave your suitcase in the car. After surgery it may be brought to your room.  For patients admitted to the hospital, discharge time is determined by your                treatment team.   Patients discharged the day of surgery will not be allowed to drive home.   Please read over the following fact sheets that you were given:   MRSA Information   __X__ Take these medicines the morning of surgery with A SIP OF WATER:    1. PRILOSEC AT BEDTIME AND AGAIN THE MORNING OF SURGERY  2.   3.   4.  5.  6.  ____ Fleet Enema (as directed)   __X__ Use CHG Soap as directed  ____ Use inhalers on the day of surgery  ____ Stop metformin 2 days prior to surgery    ____ Take 1/2 of usual insulin dose the night  before surgery and none on the morning of surgery.   ____ Stop Coumadin/Plavix/aspirin on   __X__ Stop Anti-inflammatories such as Advil, Aleve, Ibuprofen, Motrin, Naproxen, Naprosyn, Goodies,powder, or aspirin products.  OK to take Tylenol.   ____ Stop supplements until after surgery.    ____ Bring C-Pap to the hospital.

## 2016-12-27 ENCOUNTER — Encounter: Payer: Self-pay | Admitting: Family Medicine

## 2016-12-27 ENCOUNTER — Ambulatory Visit (INDEPENDENT_AMBULATORY_CARE_PROVIDER_SITE_OTHER): Payer: PRIVATE HEALTH INSURANCE | Admitting: Family Medicine

## 2016-12-27 VITALS — BP 112/72 | HR 91 | Ht 61.0 in | Wt 139.8 lb

## 2016-12-27 DIAGNOSIS — Z85819 Personal history of malignant neoplasm of unspecified site of lip, oral cavity, and pharynx: Secondary | ICD-10-CM | POA: Diagnosis not present

## 2016-12-27 DIAGNOSIS — E278 Other specified disorders of adrenal gland: Secondary | ICD-10-CM

## 2016-12-27 DIAGNOSIS — Z853 Personal history of malignant neoplasm of breast: Secondary | ICD-10-CM

## 2016-12-27 DIAGNOSIS — E279 Disorder of adrenal gland, unspecified: Secondary | ICD-10-CM

## 2016-12-27 DIAGNOSIS — R918 Other nonspecific abnormal finding of lung field: Secondary | ICD-10-CM

## 2016-12-27 DIAGNOSIS — Z01818 Encounter for other preprocedural examination: Secondary | ICD-10-CM

## 2016-12-27 NOTE — Progress Notes (Signed)
Patient: Denise Watts Female    DOB: 13-Mar-1956   61 y.o.   MRN: [id number] Visit Date: 12/27/2016  Today's Provider: Vernie Murders, PA   Chief Complaint  Patient presents with  . Surgical Clearance   Subjective:    HPI Patient is here for surgical clearance. She is having robotic adrenalectomy surgery on 12/28/16 by Dr. Erlene Quan. No concerns about the surgery. She will be released to go home approximately 2-3 days after the surgery. She does not smoke or drink alcohol. Patient states she has been having chest pain.     Past Medical History:  Diagnosis Date  . Breast cancer [facility]) 1997   right breast lumpectomy with radiation  . 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 GERD (gastroesophageal reflux disease)   . History of hiatal hernia   . Skin cancer   . Throat cancer [facility]) 1998   Patient Active Problem List   Diagnosis Date Noted  . RIGHT Adrenal Mass 12/16/2016  . Fatigue 02/17/2016  . Other fatigue 02/17/2016  . Multiple lung nodules on CT 02/17/2016  . History of right breast cancer 02/17/2016  . Peripheral neuropathy 02/17/2016  . Closed fracture of a rib 02/02/2015  . History of fundoplication 82/42/3536  . History of throat cancer 01/15/2015  . Acid reflux 01/15/2015  . Gonalgia 01/15/2015  . Closed fracture of pubis ([place]) 01/15/2015  . Pelvic fracture ([place]) 01/12/2015  . 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 Herpes simplex virus (HSV) epithelial keratitis 03/06/2013  . Nuclear sclerotic cataract 03/06/2013  . Onychia of finger 10/23/2008  . Odontogenic tumor 03/13/2008  . Tobacco use 06/14/2007   Past Surgical History:  Procedure Laterality Date  . BREAST BIOPSY Right 1997   positive  . BREAST LUMPECTOMY Right 1997  . ESOPHAGUS SURGERY-Nissen Fundoplication for severe GERD  2003  . KNEE SURGERY Right   . SKIN CANCER EXCISION    . THROAT SURGERY  1998   throat cancer   . TUBAL LIGATION  1980   Family History  Problem Relation Age of Onset  .  Diabetes Mother   . Heart attack Mother   . Lung cancer Sister   . Bone cancer Sister   . Breast cancer Maternal Aunt 41  . Prostate cancer Neg Hx   . Kidney cancer Neg Hx   . Bladder Cancer Neg Hx    No Known Allergies  Previous Medications   CALCIUM CARBONATE (TUMS - DOSED IN MG ELEMENTAL CALCIUM) 500 MG CHEWABLE TABLET    Chew 1 tablet by mouth daily.   CYANOCOBALAMIN (VITAMIN B-12 PO)    Take 2 tablets by mouth daily. GUMMIES   MULTIPLE VITAMIN (MULTIVITAMIN WITH MINERALS) TABS TABLET    Take 1 tablet by mouth daily.   NAPROXEN SODIUM (ANAPROX) 220 MG TABLET    Take 440 mg by mouth 3 (three) times daily as needed (for knee/rib pain.).    OMEPRAZOLE (PRILOSEC) 40 MG CAPSULE    TAKE 1 CAPSULE BY MOUTH EVERY DAY.    Review of Systems  Constitutional: Negative.   Respiratory: Negative.   Cardiovascular: Positive for chest pain (intermittent X 5-6 weeks).    Social History  Substance Use Topics  . Smoking status: Former Research scientist (life sciences)  . Smokeless tobacco: Never Used  . Alcohol use No   Objective:   BP 112/72 (BP Location: Right Arm, Patient Position: Sitting, Cuff Size: Normal)   Pulse 91   Ht 5\' 1"  (1.549 m)   Wt 139  lb 12.8 oz (63.4 kg)   SpO2 95%   BMI 26.41 kg/m   Physical Exam  Constitutional: She is oriented to person, place, and time. She appears well-developed and well-nourished.  HENT:  Head: Normocephalic.  Right Ear: External ear normal.  Left Ear: External ear normal.  Mouth/Throat: Oropharynx is clear and moist.  Eyes: Pupils are equal, round, and reactive to light. Conjunctivae are normal.  Neck: Normal range of motion. Neck supple.  Right anterior scar from past throat and vocal cord cancer. Right submandibular node enlarged and hard versus scar tissue.  Cardiovascular: Normal rate, regular rhythm and normal heart sounds.   Pulmonary/Chest: Effort normal and breath sounds normal. She exhibits tenderness.  Pain to compress left lower ribs and lower sternum.    Abdominal: Soft. Bowel sounds are normal. There is tenderness.  Right upper abdomen to deep palpation and right posterior CVA region to percussion.  Lymphadenopathy:    She has cervical adenopathy.  Neurological: She is alert and oriented to person, place, and time.  Psychiatric: She has a normal mood and affect. Her behavior is normal.      Assessment & Plan:     1. Pre-operative clearance Scheduled for robotic right adrenalectomy on 12-28-16. Having sharp pains in the mid sternum, right abdomen and right flank to palpation or percussion. EKG on 12-22-16 compared with past tracings, did not show any significant or acute changes. Reviewed history, EKG's and physical findings with Dr. Rosanna Randy. Patient is medically cleared for right adrenalectomy tomorrow.  2. RIGHT Adrenal Mass PET scan to reassess past cancers, showed a 3.5 x 4.7 right adrenal mass on 11-21-16. New finding since scans from July 2017. Proceeding with adrenalectomy tomorrow to rule out metastasis.  3. History of right breast cancer Right breast cancer T1, N0, M0 with lumpectomy and radiation treatments in 1997. Followed annually by Dr. Janese Banks (oncologist)  4. History of throat cancer Throat cancer T1, N0, M0 with surgery and radiation treatments in 1998. Stopped all smoking in 1998. Has some scar tissue versus enlarged right submandibular node. Followed by Dr. Janese Banks (oncologist).  5. Multiple lung nodules on CT Found on CT scan in 2015 during follow up of breast cancer by Dr. Oliva Bustard (oncologist). No changes or concern for metastasis identified.

## 2016-12-27 NOTE — Pre-Procedure Instructions (Signed)
PATIENT BEING SEEN TODAY AT 1430 AT [facility]. [facility]

## 2016-12-28 ENCOUNTER — Inpatient Hospital Stay: Payer: PRIVATE HEALTH INSURANCE | Admitting: Anesthesiology

## 2016-12-28 ENCOUNTER — Encounter
Admission: RE | Disposition: A | Discharge status: Home / Self Care | Payer: Self-pay | Source: Ambulatory Visit | Attending: Urology

## 2016-12-28 ENCOUNTER — Inpatient Hospital Stay
Admission: RE | Admit: 2016-12-28 | Discharge: 2016-12-30 | DRG: 615 | Disposition: A | Discharge status: Home / Self Care | Payer: PRIVATE HEALTH INSURANCE | Source: Ambulatory Visit | Attending: Urology | Admitting: Urology

## 2016-12-28 ENCOUNTER — Encounter: Payer: Self-pay | Admitting: *Deleted

## 2016-12-28 DIAGNOSIS — D497 Neoplasm of unspecified behavior of endocrine glands and other parts of nervous system: Secondary | ICD-10-CM | POA: Diagnosis present

## 2016-12-28 DIAGNOSIS — C7491 Malignant neoplasm of unspecified part of right adrenal gland: Secondary | ICD-10-CM

## 2016-12-28 DIAGNOSIS — Z853 Personal history of malignant neoplasm of breast: Secondary | ICD-10-CM | POA: Diagnosis not present

## 2016-12-28 DIAGNOSIS — K219 Gastro-esophageal reflux disease without esophagitis: Secondary | ICD-10-CM | POA: Diagnosis present

## 2016-12-28 DIAGNOSIS — E278 Other specified disorders of adrenal gland: Secondary | ICD-10-CM | POA: Diagnosis present

## 2016-12-28 DIAGNOSIS — Z87891 Personal history of nicotine dependence: Secondary | ICD-10-CM | POA: Diagnosis not present

## 2016-12-28 DIAGNOSIS — E279 Disorder of adrenal gland, unspecified: Secondary | ICD-10-CM | POA: Diagnosis present

## 2016-12-28 DIAGNOSIS — D4411 Neoplasm of uncertain behavior of right adrenal gland: Secondary | ICD-10-CM | POA: Diagnosis not present

## 2016-12-28 DIAGNOSIS — Z85828 Personal history of other malignant neoplasm of skin: Secondary | ICD-10-CM

## 2016-12-28 DIAGNOSIS — Z8521 Personal history of malignant neoplasm of larynx: Secondary | ICD-10-CM | POA: Diagnosis not present

## 2016-12-28 DIAGNOSIS — J449 Chronic obstructive pulmonary disease, unspecified: Secondary | ICD-10-CM | POA: Diagnosis present

## 2016-12-28 HISTORY — DX: Malignant neoplasm of unspecified part of right adrenal gland: C74.91

## 2016-12-28 HISTORY — PX: ROBOTIC ADRENALECTOMY: SHX6407

## 2016-12-28 LAB — CBC
HCT: 40.6 % (ref 35.0–47.0)
HEMOGLOBIN: 14.2 g/dL (ref 12.0–16.0)
MCH: 34.9 pg — ABNORMAL HIGH (ref 26.0–34.0)
MCHC: 34.8 g/dL (ref 32.0–36.0)
MCV: 100.1 fL — ABNORMAL HIGH (ref 80.0–100.0)
PLATELETS: 165 10*3/uL (ref 150–440)
RBC: 4.06 MIL/uL (ref 3.80–5.20)
RDW: 11.7 % (ref 11.5–14.5)
WBC: 11.1 10*3/uL — AB (ref 3.6–11.0)

## 2016-12-28 LAB — ABO/RH: ABO/RH(D): O NEG

## 2016-12-28 SURGERY — ROBOTIC ADRENALECTOMY
Anesthesia: General | Site: Abdomen | Laterality: Right | Wound class: Clean Contaminated

## 2016-12-28 MED ORDER — NITROGLYCERIN IN D5W 200-5 MCG/ML-% IV SOLN
INTRAVENOUS | Status: AC
  Filled 2016-12-28: qty 250

## 2016-12-28 MED ORDER — MORPHINE SULFATE (PF) 2 MG/ML IV SOLN
2.0000 mg | INTRAVENOUS | Status: DC | PRN
  Administered 2016-12-28 – 2016-12-29 (×4): 2 mg via INTRAVENOUS
  Filled 2016-12-28 (×4): qty 1

## 2016-12-28 MED ORDER — SUGAMMADEX SODIUM 200 MG/2ML IV SOLN
INTRAVENOUS | Status: AC
  Filled 2016-12-28: qty 2

## 2016-12-28 MED ORDER — HEPARIN SODIUM (PORCINE) 5000 UNIT/ML IJ SOLN
5000.0000 [IU] | Freq: Three times a day (TID) | INTRAMUSCULAR | Status: DC
  Administered 2016-12-28 – 2016-12-30 (×5): 5000 [IU] via SUBCUTANEOUS
  Filled 2016-12-28 (×4): qty 1

## 2016-12-28 MED ORDER — FENTANYL CITRATE (PF) 100 MCG/2ML IJ SOLN
INTRAMUSCULAR | Status: AC
  Filled 2016-12-28: qty 2

## 2016-12-28 MED ORDER — ACETAMINOPHEN 325 MG PO TABS: 650.0000 mg | ORAL_TABLET | ORAL | Status: DC | PRN

## 2016-12-28 MED ORDER — PROPOFOL 10 MG/ML IV BOLUS
INTRAVENOUS | Status: AC
Start: 2016-12-28 — End: ?
  Filled 2016-12-28: qty 20

## 2016-12-28 MED ORDER — DIPHENHYDRAMINE HCL 50 MG/ML IJ SOLN: 12.5000 mg | Freq: Four times a day (QID) | INTRAMUSCULAR | Status: DC | PRN

## 2016-12-28 MED ORDER — ROCURONIUM BROMIDE 100 MG/10ML IV SOLN
INTRAVENOUS | Status: DC | PRN
  Administered 2016-12-28: 10 mg via INTRAVENOUS
  Administered 2016-12-28: 40 mg via INTRAVENOUS
  Administered 2016-12-28 (×2): 10 mg via INTRAVENOUS
  Administered 2016-12-28: 20 mg via INTRAVENOUS

## 2016-12-28 MED ORDER — THROMBIN 5000 UNITS EX SOLR
CUTANEOUS | Status: AC
  Filled 2016-12-28: qty 5000

## 2016-12-28 MED ORDER — LIDOCAINE HCL (PF) 2 % IJ SOLN
INTRAMUSCULAR | Status: AC
  Filled 2016-12-28: qty 2

## 2016-12-28 MED ORDER — DEXAMETHASONE SODIUM PHOSPHATE 10 MG/ML IJ SOLN
INTRAMUSCULAR | Status: DC | PRN
  Administered 2016-12-28: 10 mg via INTRAVENOUS

## 2016-12-28 MED ORDER — ONDANSETRON HCL 4 MG/2ML IJ SOLN
INTRAMUSCULAR | Status: AC
  Filled 2016-12-28: qty 2

## 2016-12-28 MED ORDER — LIDOCAINE HCL (CARDIAC) 20 MG/ML IV SOLN
INTRAVENOUS | Status: DC | PRN
  Administered 2016-12-28: 60 mg via INTRAVENOUS

## 2016-12-28 MED ORDER — OXYCODONE HCL 5 MG PO TABS: 5.0000 mg | ORAL_TABLET | Freq: Once | ORAL | Status: DC | PRN

## 2016-12-28 MED ORDER — CEFAZOLIN SODIUM-DEXTROSE 1-4 GM/50ML-% IV SOLN
1.0000 g | INTRAVENOUS | Status: AC
  Administered 2016-12-28 (×2): 1 g via INTRAVENOUS

## 2016-12-28 MED ORDER — LACTATED RINGERS IV SOLN
INTRAVENOUS | Status: DC
  Administered 2016-12-28 (×2): via INTRAVENOUS

## 2016-12-28 MED ORDER — FENTANYL CITRATE (PF) 100 MCG/2ML IJ SOLN
25.0000 ug | INTRAMUSCULAR | Status: DC | PRN
  Administered 2016-12-28 (×2): 25 ug via INTRAVENOUS
  Administered 2016-12-28: 50 ug via INTRAVENOUS

## 2016-12-28 MED ORDER — OXYCODONE-ACETAMINOPHEN 5-325 MG PO TABS
1.0000 | ORAL_TABLET | ORAL | Status: DC | PRN
  Administered 2016-12-28 – 2016-12-29 (×3): 1 via ORAL
  Administered 2016-12-29 – 2016-12-30 (×4): 2 via ORAL
  Filled 2016-12-28 (×4): qty 2
  Filled 2016-12-28 (×2): qty 1
  Filled 2016-12-28: qty 2

## 2016-12-28 MED ORDER — PROPOFOL 10 MG/ML IV BOLUS
INTRAVENOUS | Status: DC | PRN
  Administered 2016-12-28: 120 mg via INTRAVENOUS

## 2016-12-28 MED ORDER — BUPIVACAINE HCL (PF) 0.5 % IJ SOLN
INTRAMUSCULAR | Status: AC
  Filled 2016-12-28: qty 30

## 2016-12-28 MED ORDER — SODIUM CHLORIDE FLUSH 0.9 % IV SOLN
INTRAVENOUS | Status: AC
  Filled 2016-12-28: qty 10

## 2016-12-28 MED ORDER — FENTANYL CITRATE (PF) 250 MCG/5ML IJ SOLN
INTRAMUSCULAR | Status: AC
  Filled 2016-12-28: qty 5

## 2016-12-28 MED ORDER — DEXAMETHASONE SODIUM PHOSPHATE 10 MG/ML IJ SOLN
INTRAMUSCULAR | Status: AC
  Filled 2016-12-28: qty 1

## 2016-12-28 MED ORDER — PANTOPRAZOLE SODIUM 40 MG PO TBEC
80.0000 mg | DELAYED_RELEASE_TABLET | Freq: Every day | ORAL | Status: DC
  Administered 2016-12-29 – 2016-12-30 (×2): 80 mg via ORAL
  Filled 2016-12-28 (×2): qty 2

## 2016-12-28 MED ORDER — ACETAMINOPHEN 10 MG/ML IV SOLN
INTRAVENOUS | Status: DC | PRN
  Administered 2016-12-28: 1000 mg via INTRAVENOUS

## 2016-12-28 MED ORDER — FENTANYL CITRATE (PF) 100 MCG/2ML IJ SOLN
INTRAMUSCULAR | Status: DC | PRN
  Administered 2016-12-28 (×9): 50 ug via INTRAVENOUS

## 2016-12-28 MED ORDER — ACETAMINOPHEN 10 MG/ML IV SOLN
INTRAVENOUS | Status: AC
  Filled 2016-12-28: qty 100

## 2016-12-28 MED ORDER — MAGNESIUM CITRATE PO SOLN
1.0000 | Freq: Once | ORAL | Status: DC
  Filled 2016-12-28: qty 296

## 2016-12-28 MED ORDER — SODIUM CHLORIDE 0.9 % IV SOLN
INTRAVENOUS | Status: DC
  Administered 2016-12-28 – 2016-12-29 (×2): via INTRAVENOUS
  Administered 2016-12-29: 1000 mL via INTRAVENOUS
  Administered 2016-12-29: 19:00:00 via INTRAVENOUS

## 2016-12-28 MED ORDER — MIDAZOLAM HCL 2 MG/2ML IJ SOLN
INTRAMUSCULAR | Status: DC | PRN
  Administered 2016-12-28: 2 mg via INTRAVENOUS

## 2016-12-28 MED ORDER — DOCUSATE SODIUM 100 MG PO CAPS
100.0000 mg | ORAL_CAPSULE | Freq: Two times a day (BID) | ORAL | Status: DC
  Administered 2016-12-28 – 2016-12-30 (×3): 100 mg via ORAL
  Filled 2016-12-28 (×4): qty 1

## 2016-12-28 MED ORDER — OXYCODONE HCL 5 MG/5ML PO SOLN: 5.0000 mg | Freq: Once | ORAL | Status: DC | PRN

## 2016-12-28 MED ORDER — CEFAZOLIN SODIUM-DEXTROSE 1-4 GM/50ML-% IV SOLN
INTRAVENOUS | Status: AC
  Filled 2016-12-28: qty 50

## 2016-12-28 MED ORDER — ONDANSETRON HCL 4 MG/2ML IJ SOLN
4.0000 mg | INTRAMUSCULAR | Status: DC | PRN
  Administered 2016-12-28: 4 mg via INTRAVENOUS
  Filled 2016-12-28: qty 2

## 2016-12-28 MED ORDER — CALCIUM CARBONATE ANTACID 500 MG PO CHEW
1.0000 | CHEWABLE_TABLET | Freq: Every day | ORAL | Status: DC
  Administered 2016-12-28 – 2016-12-30 (×3): 200 mg via ORAL
  Filled 2016-12-28 (×3): qty 1

## 2016-12-28 MED ORDER — CEFAZOLIN SODIUM-DEXTROSE 1-4 GM/50ML-% IV SOLN
1.0000 g | Freq: Three times a day (TID) | INTRAVENOUS | Status: AC
  Administered 2016-12-28 – 2016-12-29 (×2): 1 g via INTRAVENOUS
  Filled 2016-12-28 (×2): qty 50

## 2016-12-28 MED ORDER — ONDANSETRON HCL 4 MG/2ML IJ SOLN
INTRAMUSCULAR | Status: DC | PRN
  Administered 2016-12-28: 4 mg via INTRAVENOUS

## 2016-12-28 MED ORDER — SEVOFLURANE IN SOLN
RESPIRATORY_TRACT | Status: AC
  Filled 2016-12-28: qty 250

## 2016-12-28 MED ORDER — ESMOLOL HCL 100 MG/10ML IV SOLN
INTRAVENOUS | Status: DC | PRN
  Administered 2016-12-28: 20 mg via INTRAVENOUS
  Administered 2016-12-28: 10 mg via INTRAVENOUS

## 2016-12-28 MED ORDER — OXYBUTYNIN CHLORIDE 5 MG PO TABS: 5.0000 mg | ORAL_TABLET | Freq: Three times a day (TID) | ORAL | Status: DC | PRN

## 2016-12-28 MED ORDER — BELLADONNA ALKALOIDS-OPIUM 16.2-60 MG RE SUPP: 1.0000 | Freq: Four times a day (QID) | RECTAL | Status: DC | PRN

## 2016-12-28 MED ORDER — ROCURONIUM BROMIDE 50 MG/5ML IV SOLN
INTRAVENOUS | Status: AC
  Filled 2016-12-28: qty 2

## 2016-12-28 MED ORDER — MIDAZOLAM HCL 2 MG/2ML IJ SOLN
INTRAMUSCULAR | Status: AC
  Filled 2016-12-28: qty 2

## 2016-12-28 MED ORDER — SUGAMMADEX SODIUM 200 MG/2ML IV SOLN
INTRAVENOUS | Status: DC | PRN
  Administered 2016-12-28: 130 mg via INTRAVENOUS

## 2016-12-28 MED ORDER — FENTANYL CITRATE (PF) 100 MCG/2ML IJ SOLN
INTRAMUSCULAR | Status: AC
  Administered 2016-12-28: 25 ug via INTRAVENOUS
  Filled 2016-12-28: qty 2

## 2016-12-28 MED ORDER — THROMBIN 5000 UNITS EX SOLR
CUTANEOUS | Status: DC | PRN
  Administered 2016-12-28: 5000 [IU] via TOPICAL

## 2016-12-28 MED ORDER — DIPHENHYDRAMINE HCL 12.5 MG/5ML PO ELIX
12.5000 mg | ORAL_SOLUTION | Freq: Four times a day (QID) | ORAL | Status: DC | PRN
  Filled 2016-12-28: qty 5

## 2016-12-28 MED ORDER — LACTATED RINGERS IV SOLN
INTRAVENOUS | Status: DC | PRN
  Administered 2016-12-28: 13:00:00 via INTRAVENOUS

## 2016-12-28 MED ORDER — BUPIVACAINE HCL 0.5 % IJ SOLN
INTRAMUSCULAR | Status: DC | PRN
  Administered 2016-12-28: 14 mL
  Administered 2016-12-28: 10 mL

## 2016-12-28 SURGICAL SUPPLY — 101 items
ANCHOR TIS RET SYS 235ML (MISCELLANEOUS) ×3 IMPLANT
APPLICATOR ARISTA FLEXITIP XL (MISCELLANEOUS) ×3 IMPLANT
APPLICATOR SURGIFLO ENDO (HEMOSTASIS) ×3 IMPLANT
APPLIER CLIP LOGIC TI 5 (MISCELLANEOUS) IMPLANT
BAG URO DRAIN 2000ML W/SPOUT (MISCELLANEOUS) ×3 IMPLANT
BLADE CLIPPER SURG (BLADE) IMPLANT
BULB RESERV EVAC DRAIN JP 100C (MISCELLANEOUS) IMPLANT
CANISTER SUCT 1200ML W/VALVE (MISCELLANEOUS) ×3 IMPLANT
CATH DRAINAGE MALECOT 26FR (CATHETERS) IMPLANT
CATH FOL 2WAY LX 18X5 (CATHETERS) IMPLANT
CATH MALECOT (CATHETERS)
CHLORAPREP W/TINT 26ML (MISCELLANEOUS) ×3 IMPLANT
CLIP SUT LAPRA TY ABSORB (SUTURE) IMPLANT
CLIP VESOLOCK LG 6/CT PURPLE (CLIP) IMPLANT
CORD BIP STRL DISP 12FT (MISCELLANEOUS) IMPLANT
CORD MONOPOLAR M/FML 12FT (MISCELLANEOUS) ×3 IMPLANT
COVER TIP SHEARS 8 DVNC (MISCELLANEOUS) ×2 IMPLANT
COVER TIP SHEARS 8MM DA VINCI (MISCELLANEOUS) ×4
DEFOGGER SCOPE WARMER CLEARIFY (MISCELLANEOUS) ×3 IMPLANT
DERMABOND ADVANCED (GAUZE/BANDAGES/DRESSINGS) ×4
DERMABOND ADVANCED .7 DNX12 (GAUZE/BANDAGES/DRESSINGS) ×2 IMPLANT
DRAIN CHANNEL JP 15F RND 16 (MISCELLANEOUS) IMPLANT
DRAIN CHANNEL JP 19F (MISCELLANEOUS) IMPLANT
DRAPE LEGGINS SURG 28X43 STRL (DRAPES) ×3 IMPLANT
DRAPE SHEET LG 3/4 BI-LAMINATE (DRAPES) ×6 IMPLANT
DRAPE SURG 17X11 SM STRL (DRAPES) ×12 IMPLANT
DRAPE TABLE BACK 80X90 (DRAPES) ×3 IMPLANT
DRAPE UNDER BUTTOCK W/FLU (DRAPES) IMPLANT
DRIVER LRG NEEDLE DA VINCI (INSTRUMENTS) ×4
DRIVER NDLE LRG DVNC (INSTRUMENTS) ×2 IMPLANT
DRSG TELFA 3X8 NADH (GAUZE/BANDAGES/DRESSINGS) ×3 IMPLANT
ELECT REM PT RETURN 9FT ADLT (ELECTROSURGICAL) ×3
ELECTRODE REM PT RTRN 9FT ADLT (ELECTROSURGICAL) ×1 IMPLANT
FILTER LAP SMOKE EVAC STRL (MISCELLANEOUS) IMPLANT
FORCEPS MARYLAND BIPOLAR 8X55 (INSTRUMENTS) ×2
FORCEPS MRYLND BPLR 8X55 DVNC (INSTRUMENTS) ×1 IMPLANT
GLOVE BIO SURGEON STRL SZ 6.5 (GLOVE) ×6 IMPLANT
GLOVE BIO SURGEONS STRL SZ 6.5 (GLOVE) ×3
GOWN STRL REUS W/ TWL LRG LVL3 (GOWN DISPOSABLE) ×3 IMPLANT
GOWN STRL REUS W/TWL LRG LVL3 (GOWN DISPOSABLE) ×6
GRASPER SUT TROCAR 14GX15 (MISCELLANEOUS) ×3 IMPLANT
HEMOSTAT ARISTA ABSORB 3G PWDR (MISCELLANEOUS) ×3 IMPLANT
HEMOSTAT SURGICEL 2X14 (HEMOSTASIS) ×3 IMPLANT
HOLDER FOLEY CATH W/STRAP (MISCELLANEOUS) ×3 IMPLANT
IRRIGATION STRYKERFLOW (MISCELLANEOUS) ×1 IMPLANT
IRRIGATOR STRYKERFLOW (MISCELLANEOUS) ×3
IV LACTATED RINGERS 1000ML (IV SOLUTION) ×3 IMPLANT
JELLY LUB 2OZ STRL (MISCELLANEOUS)
JELLY LUBE 2OZ STRL (MISCELLANEOUS) IMPLANT
KIT ACCESSORY DA VINCI DISP (KITS) ×2
KIT ACCESSORY DVNC DISP (KITS) ×1 IMPLANT
KIT PINK PAD W/HEAD ARE REST (MISCELLANEOUS) ×3
KIT PINK PAD W/HEAD ARM REST (MISCELLANEOUS) ×1 IMPLANT
LABEL OR SOLS (LABEL) ×3 IMPLANT
MARKER SKIN DUAL TIP RULER LAB (MISCELLANEOUS) ×3 IMPLANT
NDL SAFETY 18GX1.5 (NEEDLE) ×3 IMPLANT
NEEDLE HYPO 25X1 1.5 SAFETY (NEEDLE) ×3 IMPLANT
NEEDLE INSUFFLATION 14GA 120MM (NEEDLE) ×3 IMPLANT
NS IRRIG 500ML POUR BTL (IV SOLUTION) ×3 IMPLANT
PACK LAP CHOLECYSTECTOMY (MISCELLANEOUS) ×3 IMPLANT
PENCIL ELECTRO HAND CTR (MISCELLANEOUS) ×3 IMPLANT
PROGRASP ENDOWRIST DA VINCI (INSTRUMENTS) ×2
PROGRASP ENDOWRIST DVNC (INSTRUMENTS) ×1 IMPLANT
RELOAD STAPLER WHITE 60MM (STAPLE) IMPLANT
SCISSORS METZENBAUM CVD 33 (INSTRUMENTS) IMPLANT
SLEEVE ENDOPATH XCEL 5M (ENDOMECHANICALS) ×6 IMPLANT
SOLUTION ELECTROLUBE (MISCELLANEOUS) ×3 IMPLANT
SPOGE SURGIFLO 8M (HEMOSTASIS) ×2
SPONGE LAP 4X18 5PK (MISCELLANEOUS) ×3 IMPLANT
SPONGE SURGIFLO 8M (HEMOSTASIS) ×1 IMPLANT
SPONGE VERSALON 4X4 4PLY (MISCELLANEOUS) ×3 IMPLANT
STAPLE ECHEON FLEX 60 POW ENDO (STAPLE) IMPLANT
STAPLER RELOAD WHITE 60MM (STAPLE)
STAPLER SKIN PROX 35W (STAPLE) ×3 IMPLANT
STRAP SAFETY BODY (MISCELLANEOUS) ×9 IMPLANT
SUT DVC VLOC 90 3-0 CV23 UNDY (SUTURE) IMPLANT
SUT DVC VLOC 90 3-0 CV23 VLT (SUTURE)
SUT ETHILON 3-0 FS-10 30 BLK (SUTURE)
SUT MNCRL 4-0 (SUTURE)
SUT MNCRL 4-0 27XMFL (SUTURE)
SUT PROLENE 5 0 RB 1 DA (SUTURE) IMPLANT
SUT SILK 2 0 SH (SUTURE) ×3 IMPLANT
SUT VIC AB 0 CT1 36 (SUTURE) ×6 IMPLANT
SUT VIC AB 2-0 CT1 (SUTURE) IMPLANT
SUT VIC AB 2-0 SH 27 (SUTURE)
SUT VIC AB 2-0 SH 27XBRD (SUTURE) IMPLANT
SUT VICRYL 0 AB UR-6 (SUTURE) ×3 IMPLANT
SUTURE DVC VLC 90 3-0 CV23 VLT (SUTURE) IMPLANT
SUTURE EHLN 3-0 FS-10 30 BLK (SUTURE) IMPLANT
SUTURE MNCRL 4-0 27XMF (SUTURE) IMPLANT
SYR BULB IRRIG 60ML STRL (SYRINGE) ×3 IMPLANT
SYRINGE 10CC LL (SYRINGE) ×3 IMPLANT
SYRINGE IRR TOOMEY STRL 70CC (SYRINGE) IMPLANT
TAPE CLOTH 10X20 WHT NS LF (TAPE) ×2 IMPLANT
TAPE CLOTH 2X10 WHT NS LF (TAPE) ×4
TROCAR DISP BLADELESS 8 DVNC (TROCAR) ×1 IMPLANT
TROCAR DISP BLADELESS 8MM (TROCAR) ×2
TROCAR ENDOPATH XCEL 12X100 BL (ENDOMECHANICALS) ×6 IMPLANT
TROCAR XCEL 12X100 BLDLESS (ENDOMECHANICALS) ×3 IMPLANT
TROCAR XCEL NON-BLD 5MMX100MML (ENDOMECHANICALS) ×3 IMPLANT
TUBING INSUFFLATOR HI FLOW (MISCELLANEOUS) ×3 IMPLANT

## 2016-12-28 NOTE — Anesthesia Procedure Notes (Signed)
Procedure Name: Intubation Date/Time: 12/28/2016 12:39 PM Performed by: Aline Brochure Pre-anesthesia Checklist: Patient identified, Emergency Drugs available, Suction available and Patient being monitored Patient Re-evaluated:Patient Re-evaluated prior to induction Oxygen Delivery Method: Circle system utilized Preoxygenation: Pre-oxygenation with 100% oxygen Induction Type: IV induction Ventilation: Mask ventilation without difficulty Laryngoscope Size: Mac and 3 Grade View: Grade I Tube type: Oral Tube size: 7.0 mm Number of attempts: 1 Airway Equipment and Method: Stylet Placement Confirmation: ETT inserted through vocal cords under direct vision,  positive ETCO2 and breath sounds checked- equal and bilateral Secured at: 20 cm Tube secured with: Tape Dental Injury: Teeth and Oropharynx as per pre-operative assessment

## 2016-12-28 NOTE — H&P (View-Only) (Signed)
12/16/2016 6:54 AM   Denise Watts [birth year] [id number]  Referring provider: Margo Common, [address] [place], [place] [address]  CC: new patient, right adrenal mass  HPI:  1. RIGHT Adrenal Mass  - 4.7 cm right adrenal mass by PET- CT 10/2016 incidental on surveillance of h/o throat and breast CA. Mass is very mild PET avid, solitary, with preserved fat planes around. CMP and metanephrines normal. This is likely new since 2017 (not seen on chest CT at that time). Ipsilateral kidney appears 1 artery / 1 vein and UNinvolved.   PMH sig fro breast CA (NED x years), throat CA (NED x years), lung nodules (non-pet avid, observed). No CV disesae / blood thinners.  Her PCP is Dr. Luvenia Heller.   Today "Denise Watts" is seen as new patient for right adrenal mass.    PMH: Past Medical History:  Diagnosis Date  . Breast cancer [facility]) 1997   right breast lumpectomy with radiation  . 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 Skin cancer   . Throat cancer [facility]) 1998    Surgical History: Past Surgical History:  Procedure Laterality Date  . BREAST BIOPSY Right 1997   positive  . BREAST LUMPECTOMY Right 1997  . KNEE SURGERY Right   . SKIN CANCER EXCISION    . THROAT SURGERY  1998   throat cancer     Home Medications:  Allergies as of 12/16/2016   No Known Allergies     Medication List       Accurate as of 12/16/16  6:54 AM. Always use your most recent med list.          calcium-vitamin D 500-200 MG-UNIT tablet Commonly known as:  OSCAL WITH D Take 1 tablet by mouth 2 (two) times daily.   cyanocobalamin 1000 MCG/ML injection Commonly known as:  (VITAMIN B-12) Inject 1 mL (1,000 mcg total) into the muscle every 30 (thirty) days. for 6 months then once a year.   naproxen sodium 220 MG tablet Commonly known as:  ANAPROX Take 220 mg by mouth.   omeprazole 40 MG capsule Commonly known as:  PRILOSEC TAKE 1 CAPSULE BY MOUTH EVERY DAY.   oxyCODONE-acetaminophen 5-325 MG  tablet Commonly known as:  PERCOCET/ROXICET       Allergies: No Known Allergies  Family History: Family History  Problem Relation Age of Onset  . Diabetes Mother   . Heart attack Mother   . Lung cancer Sister   . Bone cancer Sister   . Breast cancer Maternal Aunt 54    Social History:  reports that she has quit smoking. She has never used smokeless tobacco. She reports that she does not drink alcohol or use drugs.   Review of Systems  Gastrointestinal (upper)  : Negative for upper GI symptoms  Gastrointestinal (lower) : Negative for lower GI symptoms  Constitutional : Negative for symptoms  Skin: Negative for skin symptoms  Eyes: Negative for eye symptoms  Ear/Nose/Throat : Negative for Ear/Nose/Throat symptoms  Hematologic/Lymphatic: Negative for Hematologic/Lymphatic symptoms  Cardiovascular : Negative for cardiovascular symptoms  Respiratory : Negative for respiratory symptoms  Endocrine: Negative for endocrine symptoms  Musculoskeletal: Negative for musculoskeletal symptoms  Neurological: Negative for neurological symptoms  Psychologic: Negative for psychiatric symptoms   Physical Exam: There were no vitals taken for this visit.  Constitutional:  Alert and oriented, No acute distress. HEENT: [place] AT, moist mucus membranes.  Trachea midline, no masses. Cardiovascular: No clubbing, cyanosis, or edema. Respiratory: Normal respiratory  effort, no increased work of breathing. GI: Abdomen is soft, nontender, nondistended, no abdominal masses GU: No CVA tenderness.  Skin: No rashes, bruises or suspicious lesions. Lymph: No cervical or inguinal adenopathy. Neurologic: Grossly intact, no focal deficits, moving all 4 extremities. Psychiatric: Normal mood and affect.  Laboratory Data: Lab Results  Component Value Date   WBC 6.0 11/14/2016   HGB 15.0 11/14/2016   HCT 42.7 11/14/2016   MCV 98.7 11/14/2016   PLT 239 11/14/2016    Lab Results    Component Value Date   CREATININE 0.77 11/14/2016    No results found for: PSA  No results found for: TESTOSTERONE  No results found for: HGBA1C  Urinalysis    Component Value Date/Time   BILIRUBINUR neg 01/16/2015 1041   PROTEINUR neg 01/16/2015 1041   UROBILINOGEN 0.2 01/16/2015 1041   NITRITE neg 01/16/2015 1041   LEUKOCYTESUR Negative 01/16/2015 1041    Pertinent Imaging: PET ct reviwed as per HPI  Assessment & Plan:    1. RIGHT Adrenal Mass - size >4cm concerning for possible primary cancer or met. Non-functional by serum studies. Rec RIGHT robotic adrenalectomy with goal of complete removal. This will allows best histologic confirmation and treatment in single step.   Risks, benefits, alternatives (observation, biopsy), expected peri-op course discussed.    Alexis Frock, [facility] [address] [place], [place] [address] [phone]

## 2016-12-28 NOTE — Interval H&P Note (Signed)
History and Physical Interval Note:  12/28/2016 11:21 AM  Denise Watts  has presented today for surgery, with the diagnosis of right adrenal mass  The various methods of treatment have been discussed with the patient and family. After consideration of risks, benefits and other options for treatment, the patient has consented to  Procedure(s): ROBOTIC ADRENALECTOMY (Right) as a surgical intervention .  The patient's history has been reviewed, patient examined, no change in status, stable for surgery.  I have reviewed the patient's chart and labs.  Questions were answered to the patient's satisfaction.    [facility] CTAB  Hollice Espy

## 2016-12-28 NOTE — Anesthesia Post-op Follow-up Note (Signed)
Anesthesia QCDR form completed.        

## 2016-12-28 NOTE — Transfer of Care (Signed)
Immediate Anesthesia Transfer of Care Note  Patient: Denise Watts  Procedure(s) Performed: Procedure(s): ROBOTIC ADRENALECTOMY (Right)  Patient Location: PACU  Anesthesia Type:General  Level of Consciousness: sedated  Airway & Oxygen Therapy: Patient connected to face mask oxygen  Post-op Assessment: Post -op Vital signs reviewed and stable  Post vital signs: stable  Last Vitals:  Vitals:   12/28/16 1017 12/28/16 1738  BP: (!) 141/75 (!) 157/85  Pulse: 61 85  Resp: 18 16  Temp: (!) 35.6 C (!) 36.1 C  SpO2: 100% 100%    Last Pain:  Vitals:   12/28/16 1738  TempSrc: Temporal         Complications: No apparent anesthesia complications

## 2016-12-28 NOTE — Anesthesia Preprocedure Evaluation (Signed)
Anesthesia Evaluation  Patient identified by MRN, date of birth, ID band Patient awake    Reviewed: Allergy & Precautions, H&P , NPO status , Patient's Chart, lab work & pertinent test results  History of Anesthesia Complications Negative for: history of anesthetic complications  Airway Mallampati: III  TM Distance: <3 FB Neck ROM: limited    Dental  (+) Poor Dentition, Missing, Lower Dentures, Upper Dentures, Edentulous Lower, Edentulous Upper   Pulmonary shortness of breath and with exertion, COPD, former smoker,           Cardiovascular Exercise Tolerance: Good (-) angina(-) Past MI negative cardio ROS       Neuro/Psych  Neuromuscular disease negative psych ROS   GI/Hepatic Neg liver ROS, hiatal hernia, GERD  Medicated and Controlled,  Endo/Other  negative endocrine ROS  Renal/GU      Musculoskeletal   Abdominal   Peds  Hematology negative hematology ROS (+)   Anesthesia Other Findings Low suspicion for this tumor being a pheochromocytoma per Dr. Erlene Quan  Past Medical History: 1997: Breast cancer ([place])     Comment:  right breast lumpectomy with radiation 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) No date: GERD (gastroesophageal reflux disease) No date: History of hiatal hernia No date: Skin cancer 1998: Throat cancer [facility])  Past Surgical History: 1997: BREAST BIOPSY; Right     Comment:  positive 1997: BREAST LUMPECTOMY; Right No date: ESOPHAGUS SURGERY No date: KNEE SURGERY; Right No date: SKIN CANCER EXCISION 1998: THROAT SURGERY     Comment:  throat cancer  No date: TUBAL LIGATION  BMI    Body Mass Index:  26.26 kg/m      Reproductive/Obstetrics negative OB ROS                             Anesthesia Physical Anesthesia Plan  ASA: III  Anesthesia Plan: General ETT   Post-op Pain Management:    Induction: Intravenous  PONV Risk Score and Plan: 4 or greater and  Ondansetron, Dexamethasone, Midazolam and Treatment may vary due to age or medical condition  Airway Management Planned: Oral ETT  Additional Equipment: Arterial line  Intra-op Plan:   Post-operative Plan: Extubation in OR  Informed Consent: I have reviewed the patients History and Physical, chart, labs and discussed the procedure including the risks, benefits and alternatives for the proposed anesthesia with the patient or authorized representative who has indicated his/her understanding and acceptance.   Dental Advisory Given  Plan Discussed with: Anesthesiologist, CRNA and Surgeon  Anesthesia Plan Comments: (Patient consented for risks of anesthesia including but not limited to:  - adverse reactions to medications - damage to teeth, lips or other oral mucosa - sore throat or hoarseness - Damage to heart, brain, lungs or loss of life  Patient voiced understanding.)        Anesthesia Quick Evaluation

## 2016-12-28 NOTE — Op Note (Addendum)
12/28/16  PREOPERATIVE DIAGNOSIS: Right adrenal mass  POSTOPERATIVE DIAGNOSIS:  1. Right adrenal mass.  OPERATION PERFORMED: 1. Robotic assisted laparoscopic right adrenalectomy  SURGEON: Hollice Espy, MD   Assistant Surgeon: Clayburn Pert, M.D.  FINDINGS:  Approximately 5 cm adrenal tumor situated adjacent to the vena cava just superior to the renal vein. It appeared that there was possible direct extension into the liver on the superior margin adjacent to the vena cava.  SPECIMENS: 1. Right adrenal mass- stitch placed on superior margin   BLOOD LOSS: 250 cc  Drains: 1. 16 Fr Foley   INDICATIONS FOR OPERATION: Right adrenal mass   DESCRIPTION OF OPERATION: Informed consent was obtained. The patient was marked on the right side and then taken to the operating room, placed supine on the operating table. General anesthesia was provided. SCDs were provided for DVT prophylaxis and IV antibiotics for bacterial prophylaxis. Foley catheter was placed to drain the bladder.The patient was then positioned in left lateral decubitus position with the right flank elevated about 70 degrees. All pressure points were carefully padded. The table was flexed slightly. The right arm was placed in a padded support. The patient was secured to the table with soft chest, hip, and lower extremity straps. The patient was prepped and draped in the usual sterile fashion. We had a time-out confirming the patient identification, the planned procedure, the surgical site, and all present were in agreement.   A Veress needle was placed in the right upper quadrant 2 fingerbreadths below the costal margin at the planned trocar site. Aspiration, irrigation, and saline drop test confirmed good position. Opening pressure was less than 9 mmHg. The abdomen was insufflated to 15 mmHg. Following this, trocar sites were mapped out with two robotic trocars triangulating the expected location of the tumor, a  12 mm trocar between, at about the midclavicular line, for the camera, a 12 mm midline trocar for the assistant just above the umbilicus as well as a 5 mm just below the xiphoid, and an 57mm trocar in the right lower quadrant for the 4th arm.  I used a visual obturator to place a 5 mm trocar at one of the robotic port sites, and the peritoneal cavity was surveyed.  I used a Minette Headland device to place 0 Vicryl sutures at the 12 mm trocar sites for closure at the end of the case. The robot was docked. I placed monopolar shears in the right arm, bipolar in the left, and a double fenestrated grasper in the 4th arm.  The white line of Toldt was incised and the right colon was Reflected.  An additional 5 mm port was placed in the right flank to accommodate a self-retaining liver retractor and the liver was mobilized and retracted superiorly. The tail of Gerota's fascia was lifted up and the ureter and gonadal vein were identified. The gonadal vein was left medial and the ureter was lifted up. The psoas muscle was identified under the ureter and I followed this plane towards the renal hilum. The hilar vessels were identified and the vena cava was dissected superiorly up to the liver.    This point in time, the mass was carefully dissected. A plane was created between the medial border of the kidney within the Gerota's fat. A second plane was then created in the inferior margin just above the renal vein. This was carried out superiorly along the edge of the cava. Care was taken to avoid any injury to the cava and careful hemostasis  was maintained using bipolar electrocautery during this portion of the dissection. Along the superior margin adjacent to the vena cava, it appeared that there was perhaps direct extension of the mass into the liver at this location which was concerning. The remainder of the tumor was then able to be dissected free and placed into an Endo Catch bag. The tumor bed was  carefully inspected. There is a small amount of bleeding noted on the liver bed as well as possible very small branch off of the cava. Compression with Surgicel was held for several minutes until hemostasis was ultimately able to be achieved. Additional hemostatic products including Arixtra and surgical flow were applied. At this point in time, pneumoperitoneum was released to approximately 7 and no active bleeding was noted. The liver was then returned back to its normal anatomic positions.     Trocars wereremoved under direct vision.  Just prior to doing this, the tumor bed was reinspected and no active bleeding was noted. The tumor was extracted and sent for pathology. At the 12 mm trocar sites, the 0 Vicryl sutures that had been placed at the start of the case were tied down securely. The extraction site was closed with #0 Vicryl  Sutures in a figure-of-eight fashion.. All the wounds were copiously irrigated and then infiltrated with local anesthetic. The skin edges were re approximated with 4-0 Monocryl. Dermabond was applied.   All sponge, needle, and instrument counts were reported correct. The patient was awakened from anesthesia and transferred to recovery in stable condition. There were no complications and the patient tolerated the procedure well. Operative events were discussed with the patient's family.  Hollice Espy, MD

## 2016-12-28 NOTE — Anesthesia Postprocedure Evaluation (Signed)
Anesthesia Post Note  Patient: Denise Watts  Procedure(s) Performed: Procedure(s) (LRB): ROBOTIC ADRENALECTOMY (Right)  Patient location during evaluation: PACU Anesthesia Type: General Level of consciousness: awake and alert and oriented Pain management: pain level controlled Vital Signs Assessment: post-procedure vital signs reviewed and stable Respiratory status: spontaneous breathing Cardiovascular status: blood pressure returned to baseline Anesthetic complications: no     Last Vitals:  Vitals:   12/28/16 1916 12/28/16 2004  BP:  (!) 111/49  Pulse:  74  Resp:  12  Temp: (!) 35.6 C (!) 36.3 C  SpO2:  100%    Last Pain:  Vitals:   12/28/16 2004  TempSrc: Oral  PainSc:                  Miguelina Fore

## 2016-12-29 ENCOUNTER — Encounter: Payer: Self-pay | Admitting: Urology

## 2016-12-29 LAB — CBC
HEMATOCRIT: 38.3 % (ref 35.0–47.0)
Hemoglobin: 13.3 g/dL (ref 12.0–16.0)
MCH: 34.9 pg — ABNORMAL HIGH (ref 26.0–34.0)
MCHC: 34.8 g/dL (ref 32.0–36.0)
MCV: 100.4 fL — AB (ref 80.0–100.0)
Platelets: 163 10*3/uL (ref 150–440)
RBC: 3.81 MIL/uL (ref 3.80–5.20)
RDW: 11.5 % (ref 11.5–14.5)
WBC: 7.5 10*3/uL (ref 3.6–11.0)

## 2016-12-29 LAB — BASIC METABOLIC PANEL
ANION GAP: 4 — AB (ref 5–15)
BUN: 8 mg/dL (ref 6–20)
CO2: 27 mmol/L (ref 22–32)
Calcium: 8.4 mg/dL — ABNORMAL LOW (ref 8.9–10.3)
Chloride: 111 mmol/L (ref 101–111)
Creatinine, Ser: 0.67 mg/dL (ref 0.44–1.00)
GFR calc Af Amer: >60 mL/min (ref >60)
GFR calc non Af Amer: >60 mL/min (ref >60)
GLUCOSE: 157 mg/dL — AB (ref 65–99)
POTASSIUM: 4.7 mmol/L (ref 3.5–5.1)
Sodium: 142 mmol/L (ref 135–145)

## 2016-12-29 MED ORDER — OXYCODONE-ACETAMINOPHEN 5-325 MG PO TABS: 1.0000 | ORAL_TABLET | ORAL | 0 refills | Status: DC | PRN

## 2016-12-29 MED ORDER — DOCUSATE SODIUM 100 MG PO CAPS: 100.0000 mg | ORAL_CAPSULE | Freq: Two times a day (BID) | ORAL | 0 refills | Status: DC

## 2016-12-29 NOTE — Progress Notes (Signed)
1 Day Post-Op Subjective: No nausea or vomiting, tolerating diet. OOB x 1 to void this AM.  Still wearing 02.  c/o right shoulder discomfort without deficient.     Objective: Vital signs in last 24 hours: Temp:  [92 F (33.3 C)-98.3 F (36.8 C)] 97.8 F (36.6 C) (08/30 0949) Pulse Rate:  [73-96] 96 (08/30 0949) Resp:  [10-19] 16 (08/30 0949) BP: (96-173)/(49-98) 96/61 (08/30 0949) SpO2:  [90 %-100 %] 98 % (08/30 0949)  Intake/Output from previous day: 08/29 0701 - 08/30 0700 In: 3647 [I.V.:3597; IV Piggyback:50] Out: 1950 [Urine:1750; Blood:200] Intake/Output this shift: Total I/O In: 360 [P.O.:360] Out: 100 [Urine:100]  Physical Exam:  General: Alert and oriented. CV: RRR Lungs: Clear bilaterally. GI: Soft, Nondistended. Incisions: Clean and dry. Extremities: Nontender, no erythema, no edema. MSK: normal ROM in arms without weaknesses.  Lab Results:  Recent Labs  12/28/16 1936 12/29/16 0502  HGB 14.2 13.3  HCT 40.6 38.3          Recent Labs  12/29/16 0502  CREATININE 0.67           Results for orders placed or performed during the hospital encounter of 12/28/16 (from the past 24 hour(s))  ABO/Rh     Status: None   Collection Time: 12/28/16 10:51 AM  Result Value Ref Range   ABO/RH(D) O NEG   CBC     Status: Abnormal   Collection Time: 12/28/16  7:36 PM  Result Value Ref Range   WBC 11.1 (H) 3.6 - 11.0 K/uL   RBC 4.06 3.80 - 5.20 MIL/uL   Hemoglobin 14.2 12.0 - 16.0 g/dL   HCT 40.6 35.0 - 47.0 %   MCV 100.1 (H) 80.0 - 100.0 fL   MCH 34.9 (H) 26.0 - 34.0 pg   MCHC 34.8 32.0 - 36.0 g/dL   RDW 11.7 11.5 - 14.5 %   Platelets 165 150 - 440 K/uL  CBC     Status: Abnormal   Collection Time: 12/29/16  5:02 AM  Result Value Ref Range   WBC 7.5 3.6 - 11.0 K/uL   RBC 3.81 3.80 - 5.20 MIL/uL   Hemoglobin 13.3 12.0 - 16.0 g/dL   HCT 38.3 35.0 - 47.0 %   MCV 100.4 (H) 80.0 - 100.0 fL   MCH 34.9 (H) 26.0 - 34.0 pg   MCHC 34.8 32.0 - 36.0 g/dL   RDW 11.5  11.5 - 14.5 %   Platelets 163 150 - 440 K/uL  Basic metabolic panel     Status: Abnormal   Collection Time: 12/29/16  5:02 AM  Result Value Ref Range   Sodium 142 135 - 145 mmol/L   Potassium 4.7 3.5 - 5.1 mmol/L   Chloride 111 101 - 111 mmol/L   CO2 27 22 - 32 mmol/L   Glucose, Bld 157 (H) 65 - 99 mg/dL   BUN 8 6 - 20 mg/dL   Creatinine, Ser 0.67 0.44 - 1.00 mg/dL   Calcium 8.4 (L) 8.9 - 10.3 mg/dL   GFR calc non Af Amer >60 >60 mL/min   GFR calc Af Amer >60 >60 mL/min   Anion gap 4 (L) 5 - 15    Assessment/Plan: POD# 1 s/p robotic right adrenalectomy.  1) Ambulate, Incentive spirometry 2) Advance diet as tolerated 3) Transition to oral pain medication 4) D/C Foley 5) Wean 02 6) Possible discharge today versus tomorrow   Hollice Espy, MD   LOS: 1 day   Hollice Espy 12/29/2016, 10:35 AM

## 2016-12-29 NOTE — Discharge Summary (Signed)
Date of admission: 12/28/2016  Date of discharge: 12/30/16  Admission diagnosis: Right adrenal mass  Discharge diagnosis: Right adrenal mass  Secondary diagnoses:  Patient Active Problem List   Diagnosis Date Noted  . Right adrenal mass ([place]) 12/28/2016  . RIGHT Adrenal Mass 12/16/2016  . Fatigue 02/17/2016  . Other fatigue 02/17/2016  . Multiple lung nodules on CT 02/17/2016  . History of right breast cancer 02/17/2016  . Peripheral neuropathy 02/17/2016  . Closed fracture of a rib 02/02/2015  . History of fundoplication 90/30/0923  . History of throat cancer 01/15/2015  . Acid reflux 01/15/2015  . Gonalgia 01/15/2015  . Closed fracture of pubis ([place]) 01/15/2015  . Pelvic fracture ([place]) 01/12/2015  . 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 Herpes simplex virus (HSV) epithelial keratitis 03/06/2013  . Nuclear sclerotic cataract 03/06/2013  . Onychia of finger 10/23/2008  . Odontogenic tumor 03/13/2008  . Tobacco use 06/14/2007    History and Physical: For full details, please see admission history and physical. Briefly, Denise Watts is a 61 y.o. year old patient with hypermetabolic 5 cm right adrenal mass admitted following uncomplicated right robotic adrenalectomy.   Hospital Course: Patient tolerated the procedure well.  She was then transferred to the floor after an uneventful PACU stay.  Her hospital course was uncomplicated.  On POD#2 she had met discharge criteria: was eating a regular diet, was up and ambulating independently,  pain was well controlled, was voiding without a catheter, and was ready to for discharge.   Physical Exam  Constitutional: She is oriented to person, place, and time. She appears well-developed.  HENT:  Head: Normocephalic and atraumatic.  Cardiovascular: Normal rate.   Pulmonary/Chest: Effort normal. No respiratory distress.  Abdominal: Soft. She exhibits no distension.  Wounds c/d/i.  Appropriately tender.    Neurological: She is alert and  oriented to person, place, and time.  Skin: Skin is warm and dry.  Psychiatric: She has a normal mood and affect.  Vitals reviewed.     Laboratory values:   Recent Labs  12/28/16 1936 12/29/16 0502  WBC 11.1* 7.5  HGB 14.2 13.3  HCT 40.6 38.3    Recent Labs  12/29/16 0502  NA 142  K 4.7  CL 111  CO2 27  GLUCOSE 157*  BUN 8  CREATININE 0.67  CALCIUM 8.4*   No results for input(s): LABPT, INR in the last 72 hours. No results for input(s): LABURIN in the last 72 hours. Results for orders placed or performed in visit on 12/16/16  Microscopic Examination     Status: Abnormal   Collection Time: 12/16/16  9:22 AM  Result Value Ref Range Status   WBC, UA None seen 0 - 5 /hpf Final   RBC, UA None seen 0 - 2 /hpf Final   Epithelial Cells (non renal) 0-10 0 - 10 /hpf Final   Bacteria, UA Few (A) None seen/Few Final    Disposition: Home  Discharge instruction: The patient was instructed to be ambulatory but told to refrain from heavy lifting, strenuous activity, or driving while taking narcotics.  Discharge medications:  Allergies as of 12/29/2016   No Known Allergies     Medication List    TAKE these medications   calcium carbonate 500 MG chewable tablet Commonly known as:  TUMS - dosed in mg elemental calcium Chew 1 tablet by mouth daily.   docusate sodium 100 MG capsule Commonly known as:  COLACE Take 1 capsule (100 mg total) by  mouth 2 (two) times daily.   multivitamin with minerals Tabs tablet Take 1 tablet by mouth daily.   naproxen sodium 220 MG tablet Commonly known as:  ANAPROX Take 440 mg by mouth 3 (three) times daily as needed (for knee/rib pain.).   omeprazole 40 MG capsule Commonly known as:  PRILOSEC TAKE 1 CAPSULE BY MOUTH EVERY DAY. What changed:  See the new instructions.   oxyCODONE-acetaminophen 5-325 MG tablet Commonly known as:  PERCOCET Take 1-2 tablets by mouth every 4 (four) hours as needed for moderate pain or severe pain.    VITAMIN B-12 PO Take 2 tablets by mouth daily. [place]            Discharge Care Instructions        Start     Ordered   12/29/16 0000  oxyCODONE-acetaminophen (PERCOCET) 5-325 MG tablet  Every 4 hours PRN     12/29/16 0933   12/29/16 0000  docusate sodium (COLACE) 100 MG capsule  2 times daily     12/29/16 0933      Followup:  Follow-up Information    Hollice Espy, MD In 4 weeks.   Specialty:  Urology Why:  wound check, already scheduled to see Dr. Janese Banks next week Contact information: [address] [place] [id number] [phone]

## 2016-12-29 NOTE — Discharge Instructions (Signed)
·   Activity:  You are encouraged to ambulate frequently (about every hour during waking hours) to help prevent blood clots from forming in your legs or lungs.  However, you should not engage in any heavy lifting (> 5-10 lbs), strenuous activity, or straining. ° °· Diet: You should advance your diet as instructed by your physician.  It will be normal to have some bloating, nausea, and abdominal discomfort intermittently. ° °· Prescriptions:  You will be provided a prescription for pain medication to take as needed.  If your pain is not severe enough to require the prescription pain medication, you may take extra strength Tylenol instead which will have less side effects.  You should also take a prescribed stool softener to avoid straining with bowel movements as the prescription pain medication may constipate you. ° °· Incisions: You may remove your dressing bandages 48 hours after surgery if not removed in the hospital.  You will either have some small staples or special tissue glue at each of the incision sites. Once the bandages are removed (if present), the incisions may stay open to air.  You may start showering (but not soaking or bathing in water) the 2nd day after surgery and the incisions simply need to be patted dry after the shower.  No additional care is needed. ° °What to call us about: You should call the office if you develop fever > 101 or develop persistent vomiting, redness or draining around your incision, or any other concerning symptoms.   ° °[facility] °[address], [address] °[place], [place] [address] °[phone] ° ° °

## 2016-12-30 NOTE — Progress Notes (Addendum)
Denise Watts seen at [facility] 12/27/2016 for surgical clearance. Seen by Dr. Hollice Espy 12/28/2016 and had surgery. Patient Discharged 12/30/2016. Patient will return to see Dr. Erlene Quan on 09.28/2018 and will be release from work until then. Sincerely  Dr. Hollice Espy Orlinda Blalock RN MSN

## 2016-12-30 NOTE — Progress Notes (Signed)
Patient discharged to home as ordered, discharge instructions and follow up appointments given, prescriptions given as ordered. IV's discontinued both sites clean, dry and intact. Patient is alert and oriented ambulates well with minimal assistance. Patient daughter and husband at the bedside to take patient home.

## 2017-01-03 ENCOUNTER — Other Ambulatory Visit: Payer: Self-pay | Admitting: Oncology

## 2017-01-03 DIAGNOSIS — E278 Other specified disorders of adrenal gland: Secondary | ICD-10-CM

## 2017-01-04 ENCOUNTER — Other Ambulatory Visit: Payer: Self-pay | Admitting: *Deleted

## 2017-01-04 ENCOUNTER — Telehealth: Payer: Self-pay | Admitting: Urology

## 2017-01-04 MED ORDER — OXYCODONE-ACETAMINOPHEN 5-325 MG PO TABS: 1.0000 | ORAL_TABLET | ORAL | 0 refills | Status: DC | PRN

## 2017-01-04 NOTE — Telephone Encounter (Signed)
It's been a full week since surgery so she really needs to transition to Tylenol and Motrin, alternating q4 hours.    She can have 4 more tablets for emergency only.  Hollice Espy, MD

## 2017-01-04 NOTE — Telephone Encounter (Signed)
Spoke to patient. Gave instructions and advised Rx available for p/u up front. Patient verbalized understanding

## 2017-01-04 NOTE — Telephone Encounter (Signed)
Patient said she is almost out of her pain meds and has been trying to take them every 8 hours and is still in a lot of pain. Wants to know if she can have another script for them. Please advise.  Thanks,  Sharyn Lull

## 2017-01-05 ENCOUNTER — Telehealth: Payer: Self-pay | Admitting: *Deleted

## 2017-01-05 ENCOUNTER — Ambulatory Visit: Payer: PRIVATE HEALTH INSURANCE | Admitting: Oncology

## 2017-01-05 ENCOUNTER — Inpatient Hospital Stay: Payer: PRIVATE HEALTH INSURANCE | Admitting: Oncology

## 2017-01-05 NOTE — Telephone Encounter (Signed)
Called pt and got her voice mail and then called husband and was able to get in touch with him and let him know that we still don't have pathology back yet.  They are cont. To do testing on it.  We would like to cancel today appt since we don't have pathology.  Husband is agreeable and I told him I can ask if Dr. Janese Banks would like to r/s her and the husband just wants her to call them when it does come back and see md. I let rao know that also.

## 2017-01-11 ENCOUNTER — Encounter: Payer: Self-pay | Admitting: Family Medicine

## 2017-01-13 LAB — SURGICAL PATHOLOGY

## 2017-01-16 ENCOUNTER — Telehealth: Payer: Self-pay | Admitting: *Deleted

## 2017-01-16 NOTE — Telephone Encounter (Signed)
Patient wanted to be called and scheduled for an appt when the pathology was back on her adrenal mass. The results are in and pt will come to [place] office tom at 11:45.  appt made for this date and time.

## 2017-01-17 ENCOUNTER — Encounter: Payer: Self-pay | Admitting: Oncology

## 2017-01-17 ENCOUNTER — Other Ambulatory Visit: Payer: Self-pay | Admitting: Urology

## 2017-01-17 ENCOUNTER — Inpatient Hospital Stay: Payer: PRIVATE HEALTH INSURANCE | Attending: Oncology | Admitting: Oncology

## 2017-01-17 VITALS — BP 123/80 | HR 76 | Temp 98.4°F | Resp 18 | Wt 133.6 lb

## 2017-01-17 DIAGNOSIS — E279 Disorder of adrenal gland, unspecified: Secondary | ICD-10-CM | POA: Insufficient documentation

## 2017-01-17 DIAGNOSIS — Z923 Personal history of irradiation: Secondary | ICD-10-CM | POA: Insufficient documentation

## 2017-01-17 DIAGNOSIS — C7491 Malignant neoplasm of unspecified part of right adrenal gland: Secondary | ICD-10-CM

## 2017-01-17 DIAGNOSIS — Z853 Personal history of malignant neoplasm of breast: Secondary | ICD-10-CM | POA: Diagnosis not present

## 2017-01-17 DIAGNOSIS — Z79899 Other long term (current) drug therapy: Secondary | ICD-10-CM | POA: Insufficient documentation

## 2017-01-17 DIAGNOSIS — I700 Atherosclerosis of aorta: Secondary | ICD-10-CM | POA: Diagnosis not present

## 2017-01-17 DIAGNOSIS — R1310 Dysphagia, unspecified: Secondary | ICD-10-CM | POA: Insufficient documentation

## 2017-01-17 DIAGNOSIS — G8918 Other acute postprocedural pain: Secondary | ICD-10-CM | POA: Insufficient documentation

## 2017-01-17 DIAGNOSIS — R918 Other nonspecific abnormal finding of lung field: Secondary | ICD-10-CM | POA: Diagnosis not present

## 2017-01-17 DIAGNOSIS — C320 Malignant neoplasm of glottis: Secondary | ICD-10-CM

## 2017-01-17 DIAGNOSIS — R221 Localized swelling, mass and lump, neck: Secondary | ICD-10-CM | POA: Diagnosis not present

## 2017-01-17 DIAGNOSIS — Z8521 Personal history of malignant neoplasm of larynx: Secondary | ICD-10-CM | POA: Insufficient documentation

## 2017-01-17 DIAGNOSIS — Z803 Family history of malignant neoplasm of breast: Secondary | ICD-10-CM | POA: Insufficient documentation

## 2017-01-17 DIAGNOSIS — Z801 Family history of malignant neoplasm of trachea, bronchus and lung: Secondary | ICD-10-CM | POA: Insufficient documentation

## 2017-01-17 DIAGNOSIS — Z8042 Family history of malignant neoplasm of prostate: Secondary | ICD-10-CM | POA: Diagnosis not present

## 2017-01-17 DIAGNOSIS — Z87891 Personal history of nicotine dependence: Secondary | ICD-10-CM | POA: Diagnosis not present

## 2017-01-17 DIAGNOSIS — K219 Gastro-esophageal reflux disease without esophagitis: Secondary | ICD-10-CM

## 2017-01-17 DIAGNOSIS — I2510 Atherosclerotic heart disease of native coronary artery without angina pectoris: Secondary | ICD-10-CM | POA: Diagnosis not present

## 2017-01-17 DIAGNOSIS — Z85828 Personal history of other malignant neoplasm of skin: Secondary | ICD-10-CM | POA: Diagnosis not present

## 2017-01-17 MED ORDER — OXYCODONE-ACETAMINOPHEN 5-325 MG PO TABS: 1.0000 | ORAL_TABLET | ORAL | 0 refills | Status: DC | PRN

## 2017-01-17 NOTE — Progress Notes (Signed)
Hematology/Oncology Consult note [facility]  Telephone:([phone] [phone]  Patient Care Team: Chrismon, Vickki Muff, PA as PCP - General (Physician Assistant)   Name of the patient: Denise Watts  [id number]  1955-09-15   Date of visit: 01/17/17 Diagnosis- 1. H/o breast cancer in 1997 2. H/o stage I SCC of vocal cord in 2015. 3. Lung nodules 4. Adrenal mass  Chief complaint/ Reason for visit- discuss pathology results   Heme/Onc history:  Oncology History   1. Carcinoma of breast, T1, N0, M0 tumor diagnosis.  In January of 1997 2. Carcinoma of the vocal cord T1, N0, M0 tumor.  Status postradiation therapy 3. Abnormal CT scan of the chest (December, 2015) 4. Repeat CT scan of chest shows stable nodule (July, 2016) 5. Neutropenia with normal hemoglobin and normal platelet count (July, 2016)     Cancer of vocal cord [facility])   11/10/2014 Initial Diagnosis    Cancer of vocal cord      The patient was originally diagnosed with right breast cancer in 1997, status post surgery and radiation treatment only. She never received chemotherapy or hormone therapy. For her vocal cord cancer, she had extensive surgery and radiation therapy in 1998. Due to past history of smoking, she was also noted to have lung nodules. She is undergoing active surveillance imaging study of the chest.  Patient noted to have adrenal mass on CT chest which prompted PET CT in July 2018 which showed: IMPRESSION: 1. Right adrenal mass with peripheral hypermetabolism. Given interval development since 11/18/2015, suspicious for either isolated metastasis or a primary adrenal neoplasm. This should be considered for biopsy. 2. No evidence of hypermetabolic primary malignancy or extraadrenal metastasis. Pulmonary nodules are not significantly hypermetabolic. 3. Coronary artery atherosclerosis. Aortic Atherosclerosis (ICD10-I70.0). 4. Hepatic steatosis. 5. Left sixth rib  hypermetabolism is favored to be related to remote Trauma.  Patient underwent right adrenalectomy which showed: DIAGNOSIS:  A. ADRENAL GLAND, RIGHT; ADRENALECTOMY:  - HIGH GRADE CARCINOMA WITH EXTENSIVE NECROSIS.  - MARGINS OF THE SPECIMEN ARE NEGATIVE FOR CARCINOMA.   Comment:  Sections demonstrate abundant necrosis confined to the adrenal medullary  compartment. In one block of tissue a small fragment of viable malignant  neoplasm was identified. A panel of immunohistochemical stains was  performed with the following pattern of immunoreactivity:  Super pancytokeratin: Positive  GATA-3: Negative  TTF-1: Negative  P40: Negative  Napsin: Negative  PAX-8: Negative  Chromogranin: Negative     Interval history- Patient continues to have significant post operative abdominal pain and she is almost out of her oxycodone. Also reports swelling in her right neck which she feels is growing in size. Reports difficult swallowing food  ECOG PS- 1 Pain scale- 7 Opioid associated constipation- no  Review of systems- Review of Systems  Constitutional: Negative for chills, fever, malaise/fatigue and weight loss.  HENT: Negative for congestion, ear discharge and nosebleeds.   Eyes: Negative for blurred vision.  Respiratory: Negative for cough, hemoptysis, sputum production, shortness of breath and wheezing.   Cardiovascular: Negative for chest pain, palpitations, orthopnea and claudication.  Gastrointestinal: Negative for abdominal pain, blood in stool, constipation, diarrhea, heartburn, melena, nausea and vomiting.  Genitourinary: Negative for dysuria, flank pain, frequency, hematuria and urgency.  Musculoskeletal: Negative for back pain, joint pain and myalgias.  Skin: Negative for rash.  Neurological: Negative for dizziness, tingling, focal weakness, seizures, weakness and headaches.  Endo/Heme/Allergies: Does not bruise/bleed easily.  Psychiatric/Behavioral: Negative for depression and  suicidal ideas.  The patient does not have insomnia.      No Known Allergies   Past Medical History:  Diagnosis Date  . Breast cancer [facility]) 1997   right breast lumpectomy with radiation  . 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[place]) 11/10/2014  . GERD (gastroesophageal reflux disease)   . History of hiatal hernia   . Skin cancer   . Throat cancer ([place]) 1998     Past Surgical History:  Procedure Laterality Date  . BREAST BIOPSY Right 1997   positive  . BREAST LUMPECTOMY Right 1997  . ESOPHAGUS SURGERY    . KNEE SURGERY Right   . ROBOTIC ADRENALECTOMY Right 12/28/2016   Procedure: ROBOTIC ADRENALECTOMY;  Surgeon: Hollice Espy, MD;  Location: [facility] ORS;  Service: Urology;  Laterality: Right;  . SKIN CANCER EXCISION    . THROAT SURGERY  1998   throat cancer   . TUBAL LIGATION      Social History   Social History  . Marital status: Married    Spouse name: N/A  . Number of children: N/A  . Years of education: N/A   Occupational History  . Not on file.   Social History Main Topics  . Smoking status: Former Research scientist (life sciences)  . Smokeless tobacco: Never Used  . Alcohol use No  . Drug use: No  . Sexual activity: Not on file   Other Topics Concern  . Not on file   Social History Narrative  . No narrative on file    Family History  Problem Relation Age of Onset  . Diabetes Mother   . Heart attack Mother   . Lung cancer Sister   . Bone cancer Sister   . Breast cancer Maternal Aunt 41  . Prostate cancer Neg Hx   . Kidney cancer Neg Hx   . Bladder Cancer Neg Hx      Current Outpatient Prescriptions:  .  calcium carbonate (TUMS - DOSED IN MG ELEMENTAL CALCIUM) 500 MG chewable tablet, Chew 1 tablet by mouth daily., Disp: , Rfl:  .  Cyanocobalamin (VITAMIN B-12 PO), Take 2 tablets by mouth daily. GUMMIES, Disp: , Rfl:  .  docusate sodium (COLACE) 100 MG capsule, Take 1 capsule (100 mg total) by mouth 2 (two) times daily., Disp: 60 capsule, Rfl: 0 .  Multiple Vitamin (MULTIVITAMIN WITH  MINERALS) TABS tablet, Take 1 tablet by mouth daily., Disp: , Rfl:  .  naproxen sodium (ANAPROX) 220 MG tablet, Take 440 mg by mouth 3 (three) times daily as needed (for knee/rib pain.). , Disp: , Rfl:  .  omeprazole (PRILOSEC) 40 MG capsule, TAKE 1 CAPSULE BY MOUTH EVERY DAY. (Patient taking differently: TAKE 1 CAPSULE (40 MG) BY MOUTH EVERY DAY.), Disp: 30 capsule, Rfl: 11 .  oxyCODONE-acetaminophen (PERCOCET) 5-325 MG tablet, Take 1 tablet by mouth every 4 (four) hours as needed for moderate pain or severe pain., Disp: 84 tablet, Rfl: 0  Physical exam:  Vitals:   01/17/17 1149  BP: 123/80  Pulse: 76  Resp: 18  Temp: 98.4 F (36.9 C)  TempSrc: Tympanic  Weight: 133 lb 9.6 oz (60.6 kg)   Physical Exam  Constitutional: She is oriented to person, place, and time and well-developed, well-nourished, and in no distress.  HENT:  Head: Normocephalic and atraumatic.  Eyes: Pupils are equal, round, and reactive to light. EOM are normal.  Neck: Normal range of motion.  Cardiovascular: Normal rate, regular rhythm and normal heart sounds.   Pulmonary/Chest: Effort normal and breath sounds normal.  Abdominal:  Soft. Bowel sounds are normal.  Laparoscopy scar is well healed. Mild diffuse TTP  Lymphadenopathy:  There is an ill defined swelling in the right submandibular region. No other areas of cervical, axillary or supraclavicular adenopathy  Neurological: She is alert and oriented to person, place, and time.  Skin: Skin is warm and dry.     CMP Latest Ref Rng & Units 12/29/2016  Glucose 65 - 99 mg/dL 157(H)  BUN 6 - 20 mg/dL 8  Creatinine 0.44 - 1.00 mg/dL 0.67  Sodium 135 - 145 mmol/L 142  Potassium 3.5 - 5.1 mmol/L 4.7  Chloride 101 - 111 mmol/L 111  CO2 22 - 32 mmol/L 27  Calcium 8.9 - 10.3 mg/dL 8.4(L)  Total Protein 6.5 - 8.1 g/dL -  Total Bilirubin 0.3 - 1.2 mg/dL -  Alkaline Phos 38 - 126 U/L -  AST 15 - 41 U/L -  ALT 14 - 54 U/L -   CBC Latest Ref Rng & Units 12/29/2016  WBC  3.6 - 11.0 K/uL 7.5  Hemoglobin 12.0 - 16.0 g/dL 13.3  Hematocrit 35.0 - 47.0 % 38.3  Platelets 150 - 440 K/uL 163      Assessment and plan- Patient is a 61 y.o. female with h/o vocal cord cancer and breast cancer in the past found to have incidental right adrenal mass s/p right adrenelectomy  I discussed pathology results with patient and her family. We also reviewed her pathology at the tumor board. Adrenal mass specimen showed mostly areas of fibrosis and small focus of high grade carcinoma. IHC was done but sit of primary is uncertain. There is not enough viable tumor for additional testing. PET/CT from July 2018 did not reveal any other site of metastases/ It is unclear as to what the sourecof this malignancy is.  Patient does have right submandibular swelling. Difficult to ascertain exact size due to induration and fibrosis from prior radiation therapy. At this time I will obtain CT neck soft tissue with contrast and refer her to ENT for internal exam. No other areas of palpable adenopathy. If CT neck is negative, I will repeat CT chest abdomen and pelvis in few weeks time  Patient continues to have significant post operative pain and is in significant distress from the same. I will therefore renew her oxycodone for 2 weeks  I will follow up with her depending on results of CT neck   Visit Diagnosis 1. 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[place])   2. Neck swelling   3. Dysphagia, unspecified type   4. Adrenal cancer, right [facility])      Dr. Randa Evens, MD, MPH [facility] at [facility] Pager- [phone] 01/17/2017 1:17 PM

## 2017-01-17 NOTE — Telephone Encounter (Signed)
Pt sent a message via mychart requesting more percocet. LMOM

## 2017-01-17 NOTE — Progress Notes (Signed)
Here for follow up. Holding R side abd w pain c/o of " 7 " stated s/p surg on 12/28/16 stated Dr Audree Bane office gave her 12 Oxycodone and gave 4 additional. Pt states not using them b/c has so few. Grimacing in pain.

## 2017-01-20 ENCOUNTER — Ambulatory Visit
Admission: RE | Admit: 2017-01-20 | Discharge: 2017-01-20 | Disposition: A | Discharge status: Home / Self Care | Payer: PRIVATE HEALTH INSURANCE | Source: Ambulatory Visit | Attending: Oncology | Admitting: Oncology

## 2017-01-20 ENCOUNTER — Other Ambulatory Visit: Payer: Self-pay | Admitting: *Deleted

## 2017-01-20 ENCOUNTER — Telehealth: Payer: Self-pay | Admitting: *Deleted

## 2017-01-20 DIAGNOSIS — K110 Atrophy of salivary gland: Secondary | ICD-10-CM | POA: Insufficient documentation

## 2017-01-20 DIAGNOSIS — R221 Localized swelling, mass and lump, neck: Secondary | ICD-10-CM

## 2017-01-20 DIAGNOSIS — C320 Malignant neoplasm of glottis: Secondary | ICD-10-CM | POA: Insufficient documentation

## 2017-01-20 DIAGNOSIS — R1310 Dysphagia, unspecified: Secondary | ICD-10-CM | POA: Diagnosis not present

## 2017-01-20 DIAGNOSIS — R911 Solitary pulmonary nodule: Secondary | ICD-10-CM | POA: Insufficient documentation

## 2017-01-20 MED ORDER — IOPAMIDOL (ISOVUE-300) INJECTION 61%
75.0000 mL | Freq: Once | INTRAVENOUS | Status: AC | PRN
  Administered 2017-01-20: 75 mL via INTRAVENOUS

## 2017-01-20 NOTE — Telephone Encounter (Signed)
-----   Message from Sindy Guadeloupe, MD sent at 01/20/2017 11:21 AM EDT ----- Please let patient know that ct neck did not reveal any mass. She still needs to see ENT. Lets get ct chest abdomen pelvis with contrast next month and I will see her thereafter. cmp prior to ct scan. thanks

## 2017-01-20 NOTE — Telephone Encounter (Signed)
Called pt and left her message of the info from dr. Janese Banks note. Will await her call back and also ent appt is February 02, 2017 at 3:15.

## 2017-01-23 NOTE — Telephone Encounter (Signed)
The appt is really  10/8 at 3:15 with ENT.

## 2017-01-24 ENCOUNTER — Inpatient Hospital Stay: Payer: PRIVATE HEALTH INSURANCE | Admitting: Oncology

## 2017-01-27 ENCOUNTER — Ambulatory Visit (INDEPENDENT_AMBULATORY_CARE_PROVIDER_SITE_OTHER): Payer: PRIVATE HEALTH INSURANCE | Admitting: Urology

## 2017-01-27 ENCOUNTER — Encounter: Payer: Self-pay | Admitting: Urology

## 2017-01-27 ENCOUNTER — Encounter: Payer: Self-pay | Admitting: Radiology

## 2017-01-27 VITALS — BP 124/77 | HR 72 | Ht 61.0 in | Wt 130.0 lb

## 2017-01-27 DIAGNOSIS — E278 Other specified disorders of adrenal gland: Secondary | ICD-10-CM

## 2017-01-27 DIAGNOSIS — R1030 Lower abdominal pain, unspecified: Secondary | ICD-10-CM

## 2017-01-27 DIAGNOSIS — E279 Disorder of adrenal gland, unspecified: Secondary | ICD-10-CM

## 2017-01-27 NOTE — Progress Notes (Signed)
01/27/2017 8:38 PM   Denise Watts 1955/11/19 [id number]  Referring provider: Margo Common, [place], [place] [address]  Chief Complaint  Patient presents with  . Routine Post Op    1 month    HPI: 61 year old female who presents today 1 month s/p robotic right adrenalectomy for 5 cm adrenal tumor. Surgical pathology was consistent with high-grade carcinoma with extensive necrosis. Margins were negative.   She is been seen and evaluated in follow-up by her oncologist, Dr. Janese Banks.  Given unclear etiology of the tumor and only known lesion,, preference was for surveillance at this time.  She has had fairly significant post op pain requiring additional narcotics.  Her pain is primarily around the largest port site in the right mid abdomen. No associated nausea or vomiting. No fevers. No wound issues. She has had some issues with constipation and is taking her Colace twice daily.  PMH: Past Medical History:  Diagnosis Date  . Breast cancer [facility]) 1997   right breast lumpectomy with radiation  . 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[facility]) 11/10/2014  . GERD (gastroesophageal reflux disease)   . History of hiatal hernia   . Skin cancer   . Throat cancer [facility]) 1998    Surgical History: Past Surgical History:  Procedure Laterality Date  . BREAST BIOPSY Right 1997   positive  . BREAST LUMPECTOMY Right 1997  . ESOPHAGUS SURGERY    . KNEE SURGERY Right   . ROBOTIC ADRENALECTOMY Right 12/28/2016   Procedure: ROBOTIC ADRENALECTOMY;  Surgeon: Hollice Espy, MD;  Location: [facility] ORS;  Service: Urology;  Laterality: Right;  . SKIN CANCER EXCISION    . THROAT SURGERY  1998   throat cancer   . TUBAL LIGATION      Home Medications:  Allergies as of 01/27/2017   No Known Allergies     Medication List       Accurate as of 01/27/17  8:38 PM. Always use your most recent med list.          calcium carbonate 500 MG chewable tablet Commonly known as:  TUMS - dosed in mg  elemental calcium Chew 1 tablet by mouth daily.   docusate sodium 100 MG capsule Commonly known as:  COLACE Take 1 capsule (100 mg total) by mouth 2 (two) times daily.   multivitamin with minerals Tabs tablet Take 1 tablet by mouth daily.   naproxen sodium 220 MG tablet Commonly known as:  ANAPROX Take 440 mg by mouth 3 (three) times daily as needed (for knee/rib pain.).   omeprazole 40 MG capsule Commonly known as:  PRILOSEC TAKE 1 CAPSULE BY MOUTH EVERY DAY.   oxyCODONE-acetaminophen 5-325 MG tablet Commonly known as:  PERCOCET Take 1 tablet by mouth every 4 (four) hours as needed for moderate pain or severe pain.   VITAMIN B-12 PO Take 2 tablets by mouth daily. GUMMIES       Allergies: No Known Allergies  Family History: Family History  Problem Relation Age of Onset  . Diabetes Mother   . Heart attack Mother   . Lung cancer Sister   . Bone cancer Sister   . Breast cancer Maternal Aunt 41  . Prostate cancer Neg Hx   . Kidney cancer Neg Hx   . Bladder Cancer Neg Hx     Social History:  reports that she has quit smoking. She has never used smokeless tobacco. She reports that she does not drink alcohol or use drugs.  ROS: UROLOGY  Frequent Urination?: No Hard to postpone urination?: Yes Burning/pain with urination?: No Get up at night to urinate?: No Leakage of urine?: No Urine stream starts and stops?: No Trouble starting stream?: No Do you have to strain to urinate?: No Blood in urine?: No Urinary tract infection?: No Sexually transmitted disease?: No Injury to kidneys or bladder?: No Painful intercourse?: No Weak stream?: No Currently pregnant?: No Vaginal bleeding?: No Last menstrual period?: n  Gastrointestinal Nausea?: No Vomiting?: No Indigestion/heartburn?: No Diarrhea?: No Constipation?: Yes  Constitutional Fever: No Night sweats?: No Weight loss?: No Fatigue?: No  Skin Skin rash/lesions?: No Itching?: No  Eyes Blurred vision?:  No Double vision?: No  Ears/Nose/Throat Sore throat?: No Sinus problems?: No  Hematologic/Lymphatic Swollen glands?: Yes Easy bruising?: No  Cardiovascular Leg swelling?: No Chest pain?: No  Respiratory Cough?: Yes Shortness of breath?: No  Endocrine Excessive thirst?: No  Musculoskeletal Back pain?: No Joint pain?: No  Neurological Headaches?: No Dizziness?: No  Psychologic Depression?: No Anxiety?: No  Physical Exam: BP 124/77   Pulse 72   Ht 5\' 1"  (1.549 m)   Wt 130 lb (59 kg)   BMI 24.56 kg/m   Constitutional:  Alert and oriented, No acute distress. HEENT: [place] AT, moist mucus membranes.  Trachea midline, no masses. Cardiovascular: No clubbing, cyanosis, or edema. Respiratory: Normal respiratory effort, no increased work of breathing. GI: Abdomen is soft, Nondistended, incisions healing well.  There is a large subcutaneous bulge measuring approximately 8 cm x 4 cm surrounding extraction site port where she has the most pain. No obvious fascial defect was palpable here. No obvious incisional hernia appreciated on exam today. Skin: No rashes, bruises or suspicious lesions. Neurologic: Grossly intact, no focal deficits, moving all 4 extremities. Psychiatric: Normal mood and affect.  Laboratory Data: Lab Results  Component Value Date   WBC 7.5 12/29/2016   HGB 13.3 12/29/2016   HCT 38.3 12/29/2016   MCV 100.4 (H) 12/29/2016   PLT 163 12/29/2016    Lab Results  Component Value Date   CREATININE 0.67 12/29/2016   Urinalysis N/a  Pertinent Imaging: No new imaging  Assessment & Plan:    1. RIGHT Adrenal Mass S/p right robotic adrenalectomy 12/2016 Surgical pathology review-followed by Dr. Janese Banks Mrs. Mathia for CT abdomen and pelvis next month, patient will contact oncologist office to ensure that this is scheduled  2. Lower abdominal pain Suspect subcutaneous hematoma No obvious evidence of incisional hernia but will follow-up with the CT  scan Voiding symptoms are reviewed extensively including seeking medical attention with any bowel symptoms Advised to wean from narcotics due to addictive potential, transition to NSAIDs and Tylenol as tolerated  Return in about 6 weeks (around 03/10/2017) for recheck.  Hollice Espy, MD  [facility] [address] [place], [place] [address] [phone]

## 2017-01-31 DIAGNOSIS — C678 Malignant neoplasm of overlapping sites of bladder: Secondary | ICD-10-CM

## 2017-02-06 ENCOUNTER — Other Ambulatory Visit: Payer: Self-pay | Admitting: Oncology

## 2017-02-06 ENCOUNTER — Other Ambulatory Visit: Payer: Self-pay | Admitting: *Deleted

## 2017-02-06 DIAGNOSIS — R942 Abnormal results of pulmonary function studies: Secondary | ICD-10-CM

## 2017-02-06 DIAGNOSIS — E278 Other specified disorders of adrenal gland: Secondary | ICD-10-CM

## 2017-02-06 DIAGNOSIS — C320 Malignant neoplasm of glottis: Secondary | ICD-10-CM

## 2017-02-06 DIAGNOSIS — Z853 Personal history of malignant neoplasm of breast: Secondary | ICD-10-CM

## 2017-02-07 ENCOUNTER — Telehealth: Payer: Self-pay | Admitting: *Deleted

## 2017-02-07 NOTE — Telephone Encounter (Signed)
Called pt and let her know that Dr. Janese Banks wanted to do repeat scan 3 months after her pet scan was done to compare imaging and see if anything else shows.  She is agreeable to it.  She will have creat checked by finger stick in radiology and then have scan 10/25 arrive at 10:45 to 11 am scan.  NPO for 4 hours prior to scan.  She will have to pick up prep kit  A day before the test. She is agreeable and I will send her out a reminder in the mail. She states that she feels a knot where her surgery was and it was going down and now it is getting bigger and avery sore. I told her that she should call Dr.Brandon's office and she said that the last time she was in the office with Erlene Quan she told her that if it gets worse she should call them.  Patient said she would call today or tom.

## 2017-02-08 ENCOUNTER — Encounter: Payer: Self-pay | Admitting: Urology

## 2017-02-09 ENCOUNTER — Encounter: Payer: Self-pay | Admitting: Urology

## 2017-02-09 ENCOUNTER — Ambulatory Visit
Admission: RE | Admit: 2017-02-09 | Discharge: 2017-02-09 | Disposition: A | Discharge status: Home / Self Care | Payer: PRIVATE HEALTH INSURANCE | Source: Ambulatory Visit | Attending: Urology | Admitting: Urology

## 2017-02-09 ENCOUNTER — Ambulatory Visit (INDEPENDENT_AMBULATORY_CARE_PROVIDER_SITE_OTHER): Payer: PRIVATE HEALTH INSURANCE | Admitting: Urology

## 2017-02-09 VITALS — BP 115/80 | HR 105 | Ht 61.0 in | Wt 133.6 lb

## 2017-02-09 DIAGNOSIS — R1011 Right upper quadrant pain: Secondary | ICD-10-CM | POA: Diagnosis present

## 2017-02-09 DIAGNOSIS — K439 Ventral hernia without obstruction or gangrene: Secondary | ICD-10-CM | POA: Insufficient documentation

## 2017-02-09 DIAGNOSIS — K432 Incisional hernia without obstruction or gangrene: Secondary | ICD-10-CM

## 2017-02-09 DIAGNOSIS — E278 Other specified disorders of adrenal gland: Secondary | ICD-10-CM

## 2017-02-09 DIAGNOSIS — E279 Disorder of adrenal gland, unspecified: Secondary | ICD-10-CM

## 2017-02-10 NOTE — Progress Notes (Signed)
02/09/2017 7:49 AM   Denise Watts 1955/12/25 [id number]  Referring provider: Margo Common, [place], [place] [address]  Chief Complaint  Patient presents with  . Routine Post Op    HPI: 61 year old female who returns s/p robotic right adrenalectomy for 5 cm adrenal tumor. Surgical pathology was consistent with high-grade carcinoma with extensive necrosis. Margins were negative.    She is been seen and evaluated in follow-up by her oncologist, Dr. Janese Banks.  Given unclear etiology of the tumor and only known lesion, preference was for surveillance at this time.  She is due for repeat imaging later this month.  Today, she returns with persistent pain at one of her largest right port sites. A bulge at this location appears to be increasing rather than decreasing as anticipated.   No associated nausea or vomiting. No fevers. No wound issues. She has had some issues with constipation and is taking her Colace twice daily.  PMH: Past Medical History:  Diagnosis Date  . Breast cancer [facility]) 1997   right breast lumpectomy with radiation  . 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[place]) 11/10/2014  . GERD (gastroesophageal reflux disease)   . History of hiatal hernia   . Skin cancer   . Throat cancer [facility]) 1998    Surgical History: Past Surgical History:  Procedure Laterality Date  . BREAST BIOPSY Right 1997   positive  . BREAST LUMPECTOMY Right 1997  . ESOPHAGUS SURGERY    . KNEE SURGERY Right   . ROBOTIC ADRENALECTOMY Right 12/28/2016   Procedure: ROBOTIC ADRENALECTOMY;  Surgeon: Hollice Espy, MD;  Location: [facility] ORS;  Service: Urology;  Laterality: Right;  . SKIN CANCER EXCISION    . THROAT SURGERY  1998   throat cancer   . TUBAL LIGATION      Home Medications:  Allergies as of 02/09/2017   No Known Allergies     Medication List       Accurate as of 02/09/17 11:59 PM. Always use your most recent med list.          calcium carbonate 500 MG chewable  tablet Commonly known as:  TUMS - dosed in mg elemental calcium Chew 1 tablet by mouth daily.   docusate sodium 100 MG capsule Commonly known as:  COLACE Take 1 capsule (100 mg total) by mouth 2 (two) times daily.   multivitamin with minerals Tabs tablet Take 1 tablet by mouth daily.   naproxen sodium 220 MG tablet Commonly known as:  ANAPROX Take 440 mg by mouth 3 (three) times daily as needed (for knee/rib pain.).   omeprazole 40 MG capsule Commonly known as:  PRILOSEC TAKE 1 CAPSULE BY MOUTH EVERY DAY.   oxyCODONE-acetaminophen 5-325 MG tablet Commonly known as:  PERCOCET Take 1 tablet by mouth every 4 (four) hours as needed for moderate pain or severe pain.   VITAMIN B-12 PO Take 2 tablets by mouth daily. GUMMIES       Allergies: No Known Allergies  Family History: Family History  Problem Relation Age of Onset  . Diabetes Mother   . Heart attack Mother   . Lung cancer Sister   . Bone cancer Sister   . Breast cancer Maternal Aunt 41  . Prostate cancer Neg Hx   . Kidney cancer Neg Hx   . Bladder Cancer Neg Hx     Social History:  reports that she has quit smoking. She has never used smokeless tobacco. She reports that she does not drink alcohol  or use drugs.  ROS: UROLOGY Frequent Urination?: No Hard to postpone urination?: No Burning/pain with urination?: No Get up at night to urinate?: No Leakage of urine?: No Urine stream starts and stops?: No Trouble starting stream?: No Do you have to strain to urinate?: No Blood in urine?: No Urinary tract infection?: No Sexually transmitted disease?: No Injury to kidneys or bladder?: No Painful intercourse?: No Weak stream?: No Currently pregnant?: No Vaginal bleeding?: No Last menstrual period?: n  Gastrointestinal Nausea?: No Vomiting?: No Indigestion/heartburn?: No Diarrhea?: No Constipation?: No  Constitutional Fever: No Night sweats?: No Weight loss?: No Fatigue?: No  Skin Skin  rash/lesions?: No Itching?: No  Eyes Blurred vision?: No Double vision?: No  Ears/Nose/Throat Sore throat?: No Sinus problems?: No  Hematologic/Lymphatic Swollen glands?: No Easy bruising?: No  Cardiovascular Leg swelling?: No Chest pain?: No  Respiratory Cough?: No Shortness of breath?: No  Endocrine Excessive thirst?: No  Musculoskeletal Back pain?: Yes Joint pain?: Yes  Neurological Headaches?: No Dizziness?: No  Psychologic Depression?: No Anxiety?: No  Physical Exam: BP 115/80 (BP Location: Left Arm, Patient Position: Sitting, Cuff Size: Normal)   Pulse (!) 105   Ht 5\' 1"  (1.549 m)   Wt 133 lb 9.6 oz (60.6 kg)   BMI 25.24 kg/m   Constitutional:  Alert and oriented, No acute distress. HEENT: [place] AT, moist mucus membranes.  Trachea midline, no masses. Cardiovascular: No clubbing, cyanosis, or edema. Respiratory: Normal respiratory effort, no increased work of breathing. GI: Abdomen is soft, Nondistended, incisions healing well.  Continues to be a large subcutaneous bulge measuring approximately 8 cm x 4 cm surrounding extraction site port where she has the most pain.  With Valsalva, this bulge worsens and is now worrisome for an incisional hernia. It is reducible. Skin: No rashes, bruises or suspicious lesions. Neurologic: Grossly intact, no focal deficits, moving all 4 extremities. Psychiatric: Normal mood and affect.  Laboratory Data: Lab Results  Component Value Date   WBC 7.5 12/29/2016   HGB 13.3 12/29/2016   HCT 38.3 12/29/2016   MCV 100.4 (H) 12/29/2016   PLT 163 12/29/2016    Lab Results  Component Value Date   CREATININE 0.67 12/29/2016   Urinalysis N/a  Pertinent Imaging: STAT abdominal ultrasound ordered  Assessment & Plan:    1. RIGHT Adrenal Mass S/p right robotic adrenalectomy 12/2016 Surgical pathology review-followed by Dr. Janese Banks Due for repeat imaging  2. Incisional hernia Bulging at previous port site was consistent  now with incisional hernia, no sign of obstruction or incarceration Stat ultrasound to confirm this diagnosis Will contact Dr. Adonis Huguenin to arrange for repair   Hollice Espy, MD  [facility] [address], [place], [place] [address] [phone]

## 2017-02-13 ENCOUNTER — Encounter: Payer: Self-pay | Admitting: General Surgery

## 2017-02-13 ENCOUNTER — Ambulatory Visit (INDEPENDENT_AMBULATORY_CARE_PROVIDER_SITE_OTHER): Payer: PRIVATE HEALTH INSURANCE | Admitting: General Surgery

## 2017-02-13 VITALS — BP 131/83 | HR 72 | Temp 97.4°F | Ht 61.0 in | Wt 132.0 lb

## 2017-02-13 DIAGNOSIS — K432 Incisional hernia without obstruction or gangrene: Secondary | ICD-10-CM | POA: Insufficient documentation

## 2017-02-13 MED ORDER — DIAZEPAM 5 MG PO TABS: 5.0000 mg | ORAL_TABLET | Freq: Four times a day (QID) | ORAL | 0 refills | Status: DC | PRN

## 2017-02-13 MED ORDER — OXYCODONE-ACETAMINOPHEN 7.5-325 MG PO TABS: 1.0000 | ORAL_TABLET | ORAL | 0 refills | Status: DC | PRN

## 2017-02-13 NOTE — Progress Notes (Signed)
Patient ID: Denise Watts, female   DOB: [birth year], 61 y.o.   MRN: [id number]  CC: bulge at incision site  HPI Denise Watts is a 61 y.o. female who presents to clinic for evaluation of a painful bulge to the right upper quadrant incision site from a recent robotic-assisted adrenalectomy that I assisted Dr. Erlene Quan from urology with.Patient reports that the bulge has been there since surgery and has become increasingly tender and painful over the last 2 weeks. She states especially worsened with activity or when somebody presses on it. She is currently taking pain medications with some relief but states they do not last long enough. She has a spasmodic pain when its at its worse as she feels like the entire right side of her abdomen spasms. She denies any recent fevers, chills, nausea, vomiting, chest pain, shortness breath, diarrhea, constipation.he adrenalectomy revealed cancer for which she is being followed by oncology as well.  HPI  Past Medical History:  Diagnosis Date  . Breast cancer [facility]) 1997   right breast lumpectomy with radiation  . 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 GERD (gastroesophageal reflux disease)   . History of hiatal hernia   . Skin cancer   . Throat cancer ([place]) 1998    Past Surgical History:  Procedure Laterality Date  . BREAST BIOPSY Right 1997   positive  . BREAST LUMPECTOMY Right 1997  . ESOPHAGUS SURGERY    . KNEE SURGERY Right   . ROBOTIC ADRENALECTOMY Right 12/28/2016   Procedure: ROBOTIC ADRENALECTOMY;  Surgeon: Hollice Espy, MD;  Location: [facility] ORS;  Service: Urology;  Laterality: Right;  . SKIN CANCER EXCISION    . THROAT SURGERY  1998   throat cancer   . TUBAL LIGATION      Family History  Problem Relation Age of Onset  . Diabetes Mother   . Heart attack Mother   . Lung cancer Sister   . Bone cancer Sister   . Breast cancer Maternal Aunt 41  . Prostate cancer Neg Hx   . Kidney cancer Neg Hx   . Bladder Cancer Neg Hx     Social  History Social History  Substance Use Topics  . Smoking status: Former Research scientist (life sciences)  . Smokeless tobacco: Never Used  . Alcohol use No    No Known Allergies  Current Outpatient Prescriptions  Medication Sig Dispense Refill  . calcium carbonate (TUMS - DOSED IN MG ELEMENTAL CALCIUM) 500 MG chewable tablet Chew 1 tablet by mouth daily.    . Cyanocobalamin (VITAMIN B-12 PO) Take 2 tablets by mouth daily. GUMMIES    . docusate sodium (COLACE) 100 MG capsule Take 1 capsule (100 mg total) by mouth 2 (two) times daily. 60 capsule 0  . Multiple Vitamin (MULTIVITAMIN WITH MINERALS) TABS tablet Take 1 tablet by mouth daily.    . naproxen sodium (ANAPROX) 220 MG tablet Take 440 mg by mouth 3 (three) times daily as needed (for knee/rib pain.).     Marland Kitchen omeprazole (PRILOSEC) 40 MG capsule TAKE 1 CAPSULE BY MOUTH EVERY DAY. (Patient taking differently: TAKE 1 CAPSULE (40 MG) BY MOUTH EVERY DAY.) 30 capsule 11  . oxyCODONE-acetaminophen (PERCOCET) 5-325 MG tablet Take 1 tablet by mouth every 4 (four) hours as needed for moderate pain or severe pain. 84 tablet 0  . diazepam (VALIUM) 5 MG tablet Take 1 tablet (5 mg total) by mouth every 6 (six) hours as needed for anxiety. 30 tablet 0  . oxyCODONE-acetaminophen (PERCOCET) 7.5-325 MG  tablet Take 1 tablet by mouth every 4 (four) hours as needed for severe pain. 30 tablet 0   No current facility-administered medications for this visit.      Review of Systems A multi-point review of systems was asked and was negative except for the findings documented in the history of present illness  Physical Exam Blood pressure 131/83, pulse 72, temperature (!) 97.4 F (36.3 C), temperature source Oral, height 5\' 1"  (1.549 m), weight 59.9 kg (132 lb). CONSTITUTIONAL: no acute distress. EYES: Pupils are equal, round, and reactive to light, Sclera are non-icteric. EARS, NOSE, MOUTH AND THROAT: The oropharynx is clear. The oral mucosa is pink and moist. Hearing is intact to  voice. LYMPH NODES:  Lymph nodes in the neck are normal. RESPIRATORY:  Lungs are clear. There is normal respiratory effort, with equal breath sounds bilaterally, and without pathologic use of accessory muscles. CARDIOVASCULAR: Heart is regular without murmurs, gallops, or rubs. GI: The abdomen is soft, tender to palpation in the right upper quadrant incision site, and nondistended. There is an obvious hernia at the incision site with a palpable but reducible mass. There is no hepatosplenomegaly. There are normal bowel sounds in all quadrants. GU: Rectal deferred.   MUSCULOSKELETAL: Normal muscle strength and tone. No cyanosis or edema.   SKIN: Turgor is good and there are no pathologic skin lesions or ulcers. NEUROLOGIC: Motor and sensation is grossly normal. Cranial nerves are grossly intact. PSYCH:  Oriented to person, place and time. Affect is normal.  Data Reviewed Abdominal wall ultrasound from last week reviewed which shows a 3.4 cm abdominal wall defect consistent with an incisional hernia. No evidence of bowel involvement. I have personally reviewed the patient's imaging, laboratory findings and medical records.    Assessment    Incisional hernia    Plan    61 year old female with an incisional hernia from a robotic-assisted right adrenalectomy. Discussed the diagnosis with the patient and her family. Patient is scheduled to have an oncology follow-up CT scannext week. Discussed that we should obtain a CT scan prior to proceeding with surgical planning. Rationale for this discussed with family given her numerous oncologic diagnoses. Patient will follow-up in clinic in 2 weeks to discuss her CT scan results and to schedule her hernia repair. In the meantime, we will provide patient with a different pain medication as well as an anti-spasmodic medication for her abdominal wall spasms.     Time spent with the patient was 30 minutes, with more than 50% of the time spent in face-to-face  education, counseling and care coordination.     Clayburn Pert, MD FACS General Surgeon 02/13/2017, 2:39 PM

## 2017-02-13 NOTE — Patient Instructions (Signed)
Your CT scan appointment is scheduled for 02/17/17 at 11:00 am in at the [facility] center across from [place].  We need for you to go to radiology in the [place] at [facility] today to pick up your prep instructions.   Please see your follow up appointment listed below.

## 2017-02-17 ENCOUNTER — Ambulatory Visit
Admission: RE | Admit: 2017-02-17 | Discharge: 2017-02-17 | Disposition: A | Discharge status: Home / Self Care | Payer: PRIVATE HEALTH INSURANCE | Source: Ambulatory Visit | Attending: Oncology | Admitting: Oncology

## 2017-02-17 ENCOUNTER — Inpatient Hospital Stay: Payer: PRIVATE HEALTH INSURANCE | Attending: Oncology

## 2017-02-17 ENCOUNTER — Other Ambulatory Visit
Admission: RE | Admit: 2017-02-17 | Discharge: 2017-02-17 | Disposition: A | Discharge status: Home / Self Care | Payer: PRIVATE HEALTH INSURANCE | Source: Ambulatory Visit | Attending: Oncology | Admitting: Oncology

## 2017-02-17 DIAGNOSIS — E278 Other specified disorders of adrenal gland: Secondary | ICD-10-CM

## 2017-02-17 DIAGNOSIS — R942 Abnormal results of pulmonary function studies: Secondary | ICD-10-CM | POA: Diagnosis not present

## 2017-02-17 DIAGNOSIS — R918 Other nonspecific abnormal finding of lung field: Secondary | ICD-10-CM | POA: Insufficient documentation

## 2017-02-17 DIAGNOSIS — Z923 Personal history of irradiation: Secondary | ICD-10-CM | POA: Diagnosis not present

## 2017-02-17 DIAGNOSIS — Z853 Personal history of malignant neoplasm of breast: Secondary | ICD-10-CM | POA: Insufficient documentation

## 2017-02-17 DIAGNOSIS — E279 Disorder of adrenal gland, unspecified: Secondary | ICD-10-CM | POA: Insufficient documentation

## 2017-02-17 DIAGNOSIS — Z8521 Personal history of malignant neoplasm of larynx: Secondary | ICD-10-CM | POA: Diagnosis not present

## 2017-02-17 DIAGNOSIS — E896 Postprocedural adrenocortical (-medullary) hypofunction: Secondary | ICD-10-CM | POA: Insufficient documentation

## 2017-02-17 DIAGNOSIS — C320 Malignant neoplasm of glottis: Secondary | ICD-10-CM | POA: Insufficient documentation

## 2017-02-17 DIAGNOSIS — K439 Ventral hernia without obstruction or gangrene: Secondary | ICD-10-CM | POA: Diagnosis not present

## 2017-02-17 LAB — BASIC METABOLIC PANEL
ANION GAP: 8 (ref 5–15)
BUN: 11 mg/dL (ref 6–20)
CALCIUM: 9.4 mg/dL (ref 8.9–10.3)
CO2: 28 mmol/L (ref 22–32)
Chloride: 102 mmol/L (ref 101–111)
Creatinine, Ser: 0.87 mg/dL (ref 0.44–1.00)
GFR calc Af Amer: >60 mL/min (ref >60)
GLUCOSE: 102 mg/dL — AB (ref 65–99)
Potassium: 4.5 mmol/L (ref 3.5–5.1)
SODIUM: 138 mmol/L (ref 135–145)

## 2017-02-17 MED ORDER — IOPAMIDOL (ISOVUE-300) INJECTION 61%
125.0000 mL | Freq: Once | INTRAVENOUS | Status: AC | PRN
  Administered 2017-02-17: 125 mL via INTRAVENOUS

## 2017-02-20 ENCOUNTER — Telehealth: Payer: Self-pay

## 2017-02-20 NOTE — Telephone Encounter (Signed)
Patient notified of CT results- abdominal wall hernia.  Patient has follow up with Dr.Woodham 02/27/17.

## 2017-02-23 ENCOUNTER — Ambulatory Visit: Payer: PRIVATE HEALTH INSURANCE

## 2017-02-27 ENCOUNTER — Ambulatory Visit (INDEPENDENT_AMBULATORY_CARE_PROVIDER_SITE_OTHER): Payer: PRIVATE HEALTH INSURANCE | Admitting: General Surgery

## 2017-02-27 ENCOUNTER — Encounter: Payer: Self-pay | Admitting: General Surgery

## 2017-02-27 VITALS — BP 140/90 | HR 89 | Temp 98.1°F | Ht 61.0 in | Wt 134.6 lb

## 2017-02-27 DIAGNOSIS — K432 Incisional hernia without obstruction or gangrene: Secondary | ICD-10-CM

## 2017-02-27 MED ORDER — OXYCODONE-ACETAMINOPHEN 7.5-325 MG PO TABS: 1.0000 | ORAL_TABLET | ORAL | 0 refills | Status: DC | PRN

## 2017-02-27 MED ORDER — DIAZEPAM 5 MG PO TABS: 5.0000 mg | ORAL_TABLET | Freq: Four times a day (QID) | ORAL | 0 refills | Status: DC | PRN

## 2017-02-27 NOTE — Patient Instructions (Signed)
We have seen you today in our office for your Ventral (Abdominal) Hernia. We have arranged for your surgery to be completed on 03/03/17 with Dr. Adonis Huguenin at [facility]. Please see (Blue) Pre-care Sheet given in office today for further instructions.  If the Hernia comes out, lie down and push the protruding part back in if possible. It is much easier to do this while lying down.  Please review the instructions below. If you develop any signs or symptoms of a strangulated hernia, please go to the Emergency Room Immediately. These symptoms are highlighted below under, "GET HELP RIGHT AWAY IF:"  If you have any questions or concerns, please call our office and speak with a nurse.    Ventral Hernia A ventral hernia is a bulge of tissue from inside the abdomen that pushes through a weak area of the muscles that form the front wall of the abdomen. The tissues inside the abdomen are inside a sac (peritoneum). These tissues include the small intestine, large intestine, and the fatty tissue that covers the intestines (omentum). Sometimes, the bulge that forms a hernia contains intestines. Other hernias contain only fat. Ventral hernias do not go away without surgical treatment. There are several types of ventral hernias. You may have:  A hernia at an incision site from previous abdominal surgery (incisional hernia).  A hernia just above the belly button (epigastric hernia), or at the belly button (umbilical hernia). These types of hernias can develop from heavy lifting or straining.  A hernia that comes and goes (reducible hernia). It may be visible only when you lift or strain. This type of hernia can be pushed back into the abdomen (reduced).  A hernia that traps abdominal tissue inside the hernia (incarcerated hernia). This type of hernia does not reduce.  A hernia that cuts off blood flow to the tissues inside the hernia (strangulated hernia). The tissues can start to die if this happens. This is a very  painful bulge that cannot be reduced. A strangulated hernia is a medical emergency.  What are the causes? This condition is caused by abdominal tissue putting pressure on an area of weakness in the abdominal muscles. What increases the risk? The following factors may make you more likely to develop this condition:  Being female.  Being 66 or older.  Being overweight or obese.  Having had previous abdominal surgery, especially if there was an infection after surgery.  Having had an injury to the abdominal wall.  Having had several pregnancies.  Having a buildup of fluid inside the abdomen (ascites).  What are the signs or symptoms? The only symptom of a ventral hernia may be a painless bulge in the abdomen. A reducible hernia may be visible only when you strain, cough, or lift. Other symptoms may include:  Dull pain.  A feeling of pressure.  Signs and symptoms of a strangulated hernia may include:  Increasing pain.  Nausea and vomiting.  Pain when pressing on the hernia.  The skin over the hernia turning red or purple.  Constipation.  Blood in the stool (feces).  How is this diagnosed? This condition may be diagnosed based on:  Your symptoms.  Your medical history.  A physical exam. You may be asked to cough or strain while standing. These actions increase the pressure inside your abdomen and force the hernia through the opening in your muscles. Your health care provider may try to reduce the hernia by pressing on it.  Imaging studies, such as an ultrasound or  CT scan.  How is this treated? This condition is treated with surgery. If you have a strangulated hernia, surgery is done as soon as possible. If your hernia is small and not incarcerated, you may be asked to lose some weight before surgery. Follow these instructions at home:  Follow instructions from your health care provider about eating or drinking restrictions.  If you are overweight, your health care  provider may recommend that you increase your activity level and eat a healthier diet.  Do not lift anything that is heavier than 10 lb (4.5 kg).  Return to your normal activities as told by your health care provider. Ask your health care provider what activities are safe for you. You may need to avoid activities that increase pressure on your hernia.  Take over-the-counter and prescription medicines only as told by your health care provider.  Keep all follow-up visits as told by your health care provider. This is important. Contact a health care provider if:  Your hernia gets larger.  Your hernia becomes painful. Get help right away if:  Your hernia becomes increasingly painful.  You have pain along with any of the following: ? Changes in skin color in the area of the hernia. ? Nausea. ? Vomiting. ? Fever. Summary  A ventral hernia is a bulge of tissue from inside the abdomen that pushes through a weak area of the muscles that form the front wall of the abdomen.  This condition is treated with surgery, which may be urgent depending on your hernia.  Do not lift anything that is heavier than 10 lb (4.5 kg), and follow activity instructions from your health care provider. This information is not intended to replace advice given to you by your health care provider. Make sure you discuss any questions you have with your health care provider. Document Released: 04/04/2012 Document Revised: 12/04/2015 Document Reviewed: 12/04/2015 Elsevier Interactive Patient Education  [employer].

## 2017-02-27 NOTE — Progress Notes (Signed)
Outpatient Surgical Follow Up  02/27/2017  Denise Watts is an 61 y.o. female.   Chief Complaint  Patient presents with  . Follow-up    Incisional Hernia    HPI: 61 year old female returns to clinic today for follow-up of an incisional hernia.  Since her last visit she obtained a CT scan which again showed the incisional hernia but more importantly failed to show any new oncologic finding within her abdomen.  The hernia is noted to be fat-containing.  Patient continues to have abdominal pain and is taking her as needed oxycodone and Valium for relief.  She does report some relief when she takes them both.  She is tolerating a diet and denies any fevers, chills, nausea, vomiting, chest pain, shortness of breath, diarrhea, constipation.  Her primary complaint is of pain in her right upper quadrant at the site of her hernia.  Past Medical History:  Diagnosis Date  . Breast cancer [facility]) 1997   right breast lumpectomy with radiation  . 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[place]) 11/10/2014  . GERD (gastroesophageal reflux disease)   . History of hiatal hernia   . Skin cancer   . Throat cancer ([place]) 1998    Past Surgical History:  Procedure Laterality Date  . BREAST BIOPSY Right 1997   positive  . BREAST LUMPECTOMY Right 1997  . ESOPHAGUS SURGERY    . KNEE SURGERY Right   . ROBOTIC ADRENALECTOMY Right 12/28/2016   Procedure: ROBOTIC ADRENALECTOMY;  Surgeon: Hollice Espy, MD;  Location: [facility] ORS;  Service: Urology;  Laterality: Right;  . SKIN CANCER EXCISION    . THROAT SURGERY  1998   throat cancer   . TUBAL LIGATION      Family History  Problem Relation Age of Onset  . Diabetes Mother   . Heart attack Mother   . Lung cancer Sister   . Bone cancer Sister   . Breast cancer Maternal Aunt 41  . Prostate cancer Neg Hx   . Kidney cancer Neg Hx   . Bladder Cancer Neg Hx     Social History:  reports that she has quit smoking. She has never used smokeless tobacco. She reports that she does not  drink alcohol or use drugs.  Allergies: No Known Allergies  Medications reviewed.    ROS Multiport review of systems was completed, all pertinent positives and negatives are documented within the HPI and the remainder are negative   BP 140/90   Pulse 89   Temp 98.1 F (36.7 C) (Oral)   Ht 5\' 1"  (1.549 m)   Wt 61.1 kg (134 lb 9.6 oz)   BMI 25.43 kg/m   Physical Exam General: No acute distress Neck: Supple nontender Chest: Clear to auscultation without use of accessory muscles. Heart: Regular rate and rhythm Abdomen: Soft, markedly tender to palpation at the visible and palpable right upper quadrant hernia site, nondistended.  No evidence of rebound, guarding, peritonitis. Extremities: Moves all extremities well.    No results found for this or any previous visit (from the past 48 hour(s)). No results found.  CT scan results:Chest Impression:  1. Stable dominant nodule in the LEFT lower lobe. 2. Small bilateral ground-glass nodules are unchanged. 3. Single new small subpleural LEFT lower lobe nodule is likely benign.  Abdomen / Pelvis Impression:  1. Interval RIGHT adrenalectomy. 2. Nodularity along the RIGHT gonadal vein and thrombus within the RIGHT vein vein presumed related to RIGHT adrenalectomy procedure. 3. No evidence of metastatic disease in the abdomen  pelvis. No peritoneal carcinomatosis 4. Abdominal wall hernia at  laparoscopic port sites.  Assessment/Plan:  1. Incisional hernia, without obstruction or gangrene 61 year old female with an incisional hernia in her right upper quadrant incision site from her adrenalectomy.  Discussed the treatment options of open versus laparoscopic incisional hernia repair.  Both surgeries were described in detail to include the risk, benefits, alternatives.  After this discussion the patient elected to proceed with an open incisional hernia repair.  All questions were answered to her satisfaction.  We will plan to  proceed to surgery this Friday, 2 November.  A total of 25 minutes was used on this encounter with greater than 25% of it used for counseling or coordination of care.     Clayburn Pert, MD FACS General Surgeon  02/27/2017,4:41 PM

## 2017-02-28 ENCOUNTER — Telehealth: Payer: Self-pay | Admitting: General Surgery

## 2017-02-28 NOTE — Telephone Encounter (Signed)
Pt advised of pre op date/time and sx date. Sx: 03/03/17 with Dr Golden Circle incisional hernia repair.  Pre op: 03/02/17 between 9-1:00pm--Phone interview.   Patient made aware to call [phone], between 1-3:00pm the day before surgery, to find out what time to arrive.

## 2017-03-02 ENCOUNTER — Encounter
Admission: RE | Admit: 2017-03-02 | Discharge: 2017-03-02 | Disposition: A | Discharge status: Home / Self Care | Payer: PRIVATE HEALTH INSURANCE | Source: Ambulatory Visit | Attending: General Surgery | Admitting: General Surgery

## 2017-03-02 DIAGNOSIS — R059 Cough, unspecified: Secondary | ICD-10-CM

## 2017-03-02 DIAGNOSIS — R058 Other specified cough: Secondary | ICD-10-CM

## 2017-03-02 HISTORY — DX: Cough: R05

## 2017-03-02 HISTORY — DX: Other specified cough: R05.8

## 2017-03-02 HISTORY — DX: Cough, unspecified: R05.9

## 2017-03-02 MED ORDER — CEFAZOLIN SODIUM-DEXTROSE 2-4 GM/100ML-% IV SOLN
2.0000 g | INTRAVENOUS | Status: AC
  Administered 2017-03-03: 2 g via INTRAVENOUS

## 2017-03-02 NOTE — Patient Instructions (Signed)
  Your procedure is scheduled on: 03-03-17 Report to Same Day Surgery 2nd floor medical mall [facility] Entrance-take elevator on left to 2nd floor.  Check in with surgery information desk.) To find out your arrival time please call [phone] between 1PM - 3PM on 03-02-17  Remember: Instructions that are not followed completely may result in serious medical risk, up to and including death, or upon the discretion of your surgeon and anesthesiologist your surgery may need to be rescheduled.    _x___ 1. Do not eat food after midnight the night before your procedure. NO GUM CHEWING OR CANDY. You may drink clear liquids up to 2 hours before you are scheduled to arrive at the hospital for your procedure.  Do not drink clear liquids within 2 hours of your scheduled arrival to the hospital.  Clear liquids include  --Water or Apple juice without pulp  --Clear carbohydrate beverage such as ClearFast or Gatorade  --Black Coffee or Clear Tea (No milk, no creamers, do not add anything to the coffee or Tea)      __x__ 2. No Alcohol for 24 hours before or after surgery.   __x__3. No Smoking for 24 prior to surgery.   ____  4. Bring all medications with you on the day of surgery if instructed.    __x__ 5. Notify your doctor if there is any change in your medical condition     (cold, fever, infections).     Do not wear jewelry, make-up, hairpins, clips or nail polish.  Do not wear lotions, powders, or perfumes. You may wear deodorant.  Do not shave 48 hours prior to surgery. Men may shave face and neck.  Do not bring valuables to the hospital.    [facility] is not responsible for any belongings or valuables.               Contacts, dentures or bridgework may not be worn into surgery.  Leave your suitcase in the car. After surgery it may be brought to your room.  For patients admitted to the hospital, discharge time is determined by your treatment team.   Patients discharged the day of surgery  will not be allowed to drive home.  You will need someone to drive you home and stay with you the night of your procedure.    Please read over the following fact sheets that you were given:     _x___ [place] WITH A SMALL SIP OF WATER. These include:  1. VALIUM  2. PERCOCET  3. PRILOSEC  4. TAKE AN EXTRA PRILOSEC TONIGHT BEFORE BED  5.  6.  ____Fleets enema or Magnesium Citrate as directed.   ____ Use CHG Soap or sage wipes as directed on instruction sheet   ____ Use inhalers on the day of surgery and bring to hospital day of surgery  ____ Stop Metformin and Janumet 2 days prior to surgery.    ____ Take 1/2 of usual insulin dose the night before surgery and none on the morning surgery.   ____ Follow recommendations from Cardiologist, Pulmonologist or PCP regarding stopping Aspirin, Coumadin, Plavix ,Eliquis, Effient, or Pradaxa, and Pletal.  X____Stop Anti-inflammatories such as Advil, Aleve, Ibuprofen, Motrin, Naproxen, Naprosyn, Goodies powders or aspirin products NOW-OK to take PERCOCET   ____ Stop supplements until after surgery.     ____ Bring C-Pap to the hospital.

## 2017-03-03 ENCOUNTER — Ambulatory Visit: Payer: PRIVATE HEALTH INSURANCE | Admitting: Anesthesiology

## 2017-03-03 ENCOUNTER — Observation Stay
Admission: RE | Admit: 2017-03-03 | Discharge: 2017-03-04 | Disposition: A | Discharge status: Home / Self Care | Payer: PRIVATE HEALTH INSURANCE | Source: Ambulatory Visit | Attending: General Surgery | Admitting: General Surgery

## 2017-03-03 ENCOUNTER — Encounter
Admission: RE | Disposition: A | Discharge status: Home / Self Care | Payer: Self-pay | Source: Ambulatory Visit | Attending: General Surgery

## 2017-03-03 DIAGNOSIS — K449 Diaphragmatic hernia without obstruction or gangrene: Secondary | ICD-10-CM | POA: Insufficient documentation

## 2017-03-03 DIAGNOSIS — Z9851 Tubal ligation status: Secondary | ICD-10-CM | POA: Diagnosis not present

## 2017-03-03 DIAGNOSIS — G8918 Other acute postprocedural pain: Secondary | ICD-10-CM | POA: Insufficient documentation

## 2017-03-03 DIAGNOSIS — Z801 Family history of malignant neoplasm of trachea, bronchus and lung: Secondary | ICD-10-CM | POA: Diagnosis not present

## 2017-03-03 DIAGNOSIS — Z809 Family history of malignant neoplasm, unspecified: Secondary | ICD-10-CM | POA: Insufficient documentation

## 2017-03-03 DIAGNOSIS — Z833 Family history of diabetes mellitus: Secondary | ICD-10-CM | POA: Diagnosis not present

## 2017-03-03 DIAGNOSIS — Z8719 Personal history of other diseases of the digestive system: Secondary | ICD-10-CM

## 2017-03-03 DIAGNOSIS — Z853 Personal history of malignant neoplasm of breast: Secondary | ICD-10-CM | POA: Insufficient documentation

## 2017-03-03 DIAGNOSIS — Z8521 Personal history of malignant neoplasm of larynx: Secondary | ICD-10-CM | POA: Diagnosis not present

## 2017-03-03 DIAGNOSIS — Z85828 Personal history of other malignant neoplasm of skin: Secondary | ICD-10-CM | POA: Diagnosis not present

## 2017-03-03 DIAGNOSIS — Z87891 Personal history of nicotine dependence: Secondary | ICD-10-CM | POA: Diagnosis not present

## 2017-03-03 DIAGNOSIS — Z8249 Family history of ischemic heart disease and other diseases of the circulatory system: Secondary | ICD-10-CM | POA: Diagnosis not present

## 2017-03-03 DIAGNOSIS — K219 Gastro-esophageal reflux disease without esophagitis: Secondary | ICD-10-CM | POA: Diagnosis not present

## 2017-03-03 DIAGNOSIS — R911 Solitary pulmonary nodule: Secondary | ICD-10-CM | POA: Diagnosis not present

## 2017-03-03 DIAGNOSIS — Z803 Family history of malignant neoplasm of breast: Secondary | ICD-10-CM | POA: Insufficient documentation

## 2017-03-03 DIAGNOSIS — Z9889 Other specified postprocedural states: Secondary | ICD-10-CM

## 2017-03-03 DIAGNOSIS — Z79899 Other long term (current) drug therapy: Secondary | ICD-10-CM | POA: Diagnosis not present

## 2017-03-03 DIAGNOSIS — K432 Incisional hernia without obstruction or gangrene: Principal | ICD-10-CM | POA: Insufficient documentation

## 2017-03-03 HISTORY — PX: VENTRAL HERNIA REPAIR: SHX424

## 2017-03-03 LAB — CBC
HCT: 43.1 % (ref 35.0–47.0)
Hemoglobin: 14.5 g/dL (ref 12.0–16.0)
MCH: 32.2 pg (ref 26.0–34.0)
MCHC: 33.7 g/dL (ref 32.0–36.0)
MCV: 95.5 fL (ref 80.0–100.0)
Platelets: 181 K/uL (ref 150–440)
RBC: 4.51 MIL/uL (ref 3.80–5.20)
RDW: 12 % (ref 11.5–14.5)
WBC: 7.2 K/uL (ref 3.6–11.0)

## 2017-03-03 LAB — CREATININE, SERUM
CREATININE: 0.84 mg/dL (ref 0.44–1.00)
GFR calc Af Amer: >60 mL/min (ref >60)
GFR calc non Af Amer: >60 mL/min (ref >60)

## 2017-03-03 SURGERY — REPAIR, HERNIA, VENTRAL
Anesthesia: General | Wound class: Clean

## 2017-03-03 MED ORDER — METHOCARBAMOL 500 MG PO TABS
500.0000 mg | ORAL_TABLET | Freq: Four times a day (QID) | ORAL | Status: DC | PRN
  Filled 2017-03-03: qty 1

## 2017-03-03 MED ORDER — SODIUM CHLORIDE 0.9 % IV SOLN
INTRAVENOUS | Status: DC | PRN
  Administered 2017-03-03: 40 mL

## 2017-03-03 MED ORDER — OXYCODONE-ACETAMINOPHEN 7.5-325 MG PO TABS
1.0000 | ORAL_TABLET | Freq: Once | ORAL | Status: AC
  Administered 2017-03-03: 1 via ORAL

## 2017-03-03 MED ORDER — OXYCODONE-ACETAMINOPHEN 7.5-325 MG PO TABS
ORAL_TABLET | ORAL | Status: AC
  Administered 2017-03-03: 1 via ORAL
  Filled 2017-03-03: qty 1

## 2017-03-03 MED ORDER — PROCHLORPERAZINE MALEATE 10 MG PO TABS
10.0000 mg | ORAL_TABLET | Freq: Four times a day (QID) | ORAL | Status: DC | PRN
  Filled 2017-03-03: qty 1

## 2017-03-03 MED ORDER — ONDANSETRON HCL 4 MG/2ML IJ SOLN
INTRAMUSCULAR | Status: DC | PRN
  Administered 2017-03-03: 4 mg via INTRAVENOUS

## 2017-03-03 MED ORDER — DIAZEPAM 5 MG PO TABS
5.0000 mg | ORAL_TABLET | Freq: Four times a day (QID) | ORAL | Status: DC | PRN
  Administered 2017-03-04: 5 mg via ORAL
  Filled 2017-03-03: qty 1

## 2017-03-03 MED ORDER — SODIUM CHLORIDE FLUSH 0.9 % IV SOLN
INTRAVENOUS | Status: AC
Start: 2017-03-03 — End: ?
  Filled 2017-03-03: qty 20

## 2017-03-03 MED ORDER — PROPOFOL 10 MG/ML IV BOLUS
INTRAVENOUS | Status: AC
  Filled 2017-03-03: qty 20

## 2017-03-03 MED ORDER — ENOXAPARIN SODIUM 40 MG/0.4ML ~~LOC~~ SOLN
40.0000 mg | SUBCUTANEOUS | Status: DC
  Administered 2017-03-04: 40 mg via SUBCUTANEOUS
  Filled 2017-03-03: qty 0.4

## 2017-03-03 MED ORDER — CEFAZOLIN SODIUM-DEXTROSE 2-4 GM/100ML-% IV SOLN
INTRAVENOUS | Status: AC
  Filled 2017-03-03: qty 100

## 2017-03-03 MED ORDER — FENTANYL CITRATE (PF) 100 MCG/2ML IJ SOLN: 25.0000 ug | Freq: Once | INTRAMUSCULAR | Status: DC

## 2017-03-03 MED ORDER — LIDOCAINE HCL (CARDIAC) 20 MG/ML IV SOLN
INTRAVENOUS | Status: DC | PRN
  Administered 2017-03-03: 40 mg via INTRAVENOUS

## 2017-03-03 MED ORDER — FENTANYL CITRATE (PF) 100 MCG/2ML IJ SOLN
INTRAMUSCULAR | Status: AC
  Administered 2017-03-03: 25 ug via INTRAVENOUS
  Filled 2017-03-03: qty 2

## 2017-03-03 MED ORDER — LIDOCAINE HCL (PF) 1 % IJ SOLN
INTRAMUSCULAR | Status: AC
  Filled 2017-03-03: qty 30

## 2017-03-03 MED ORDER — DIPHENHYDRAMINE HCL 50 MG/ML IJ SOLN: 12.5000 mg | Freq: Four times a day (QID) | INTRAMUSCULAR | Status: DC | PRN

## 2017-03-03 MED ORDER — LIDOCAINE HCL (PF) 1 % IJ SOLN
INTRAMUSCULAR | Status: DC | PRN
  Administered 2017-03-03: 10 mL

## 2017-03-03 MED ORDER — OXYCODONE HCL 5 MG PO TABS
5.0000 mg | ORAL_TABLET | ORAL | Status: DC | PRN
  Administered 2017-03-04 (×2): 10 mg via ORAL
  Filled 2017-03-03 (×3): qty 2

## 2017-03-03 MED ORDER — HYDROMORPHONE HCL 1 MG/ML IJ SOLN
1.0000 mg | INTRAMUSCULAR | Status: DC | PRN
  Administered 2017-03-03: 1 mg via INTRAVENOUS
  Filled 2017-03-03: qty 1

## 2017-03-03 MED ORDER — DEXAMETHASONE SODIUM PHOSPHATE 10 MG/ML IJ SOLN
INTRAMUSCULAR | Status: AC
  Filled 2017-03-03: qty 1

## 2017-03-03 MED ORDER — MIDAZOLAM HCL 2 MG/2ML IJ SOLN
1.0000 mg | Freq: Once | INTRAMUSCULAR | Status: AC
  Administered 2017-03-03: 1 mg via INTRAVENOUS

## 2017-03-03 MED ORDER — DIAZEPAM 5 MG PO TABS: 5.0000 mg | ORAL_TABLET | Freq: Four times a day (QID) | ORAL | 0 refills | Status: DC | PRN

## 2017-03-03 MED ORDER — BUPIVACAINE LIPOSOME 1.3 % IJ SUSP
INTRAMUSCULAR | Status: AC
  Filled 2017-03-03: qty 20

## 2017-03-03 MED ORDER — SUGAMMADEX SODIUM 200 MG/2ML IV SOLN
INTRAVENOUS | Status: DC | PRN
  Administered 2017-03-03: 121.6 mg via INTRAVENOUS

## 2017-03-03 MED ORDER — FENTANYL CITRATE (PF) 100 MCG/2ML IJ SOLN
INTRAMUSCULAR | Status: AC
  Filled 2017-03-03: qty 2

## 2017-03-03 MED ORDER — CHLORHEXIDINE GLUCONATE CLOTH 2 % EX PADS: 6.0000 | MEDICATED_PAD | Freq: Once | CUTANEOUS | Status: DC

## 2017-03-03 MED ORDER — ACETAMINOPHEN 500 MG PO TABS
1000.0000 mg | ORAL_TABLET | Freq: Four times a day (QID) | ORAL | Status: DC | PRN
  Administered 2017-03-03: 1000 mg via ORAL
  Filled 2017-03-03: qty 2

## 2017-03-03 MED ORDER — PROCHLORPERAZINE EDISYLATE 5 MG/ML IJ SOLN
5.0000 mg | Freq: Four times a day (QID) | INTRAMUSCULAR | Status: DC | PRN
  Filled 2017-03-03: qty 2

## 2017-03-03 MED ORDER — LIDOCAINE HCL (PF) 2 % IJ SOLN
INTRAMUSCULAR | Status: AC
  Filled 2017-03-03: qty 10

## 2017-03-03 MED ORDER — BUPIVACAINE-EPINEPHRINE (PF) 0.5% -1:200000 IJ SOLN
INTRAMUSCULAR | Status: AC
  Filled 2017-03-03: qty 30

## 2017-03-03 MED ORDER — MIDAZOLAM HCL 2 MG/2ML IJ SOLN
INTRAMUSCULAR | Status: AC
  Filled 2017-03-03: qty 2

## 2017-03-03 MED ORDER — FENTANYL CITRATE (PF) 100 MCG/2ML IJ SOLN
25.0000 ug | INTRAMUSCULAR | Status: AC | PRN
  Administered 2017-03-03 (×6): 25 ug via INTRAVENOUS

## 2017-03-03 MED ORDER — ROCURONIUM BROMIDE 100 MG/10ML IV SOLN
INTRAVENOUS | Status: DC | PRN
  Administered 2017-03-03: 30 mg via INTRAVENOUS

## 2017-03-03 MED ORDER — ONDANSETRON HCL 4 MG/2ML IJ SOLN: 4.0000 mg | Freq: Once | INTRAMUSCULAR | Status: DC | PRN

## 2017-03-03 MED ORDER — SUGAMMADEX SODIUM 200 MG/2ML IV SOLN
INTRAVENOUS | Status: AC
  Filled 2017-03-03: qty 2

## 2017-03-03 MED ORDER — PANTOPRAZOLE SODIUM 40 MG IV SOLR
40.0000 mg | Freq: Every day | INTRAVENOUS | Status: DC
  Administered 2017-03-03: 40 mg via INTRAVENOUS
  Filled 2017-03-03: qty 40

## 2017-03-03 MED ORDER — OXYCODONE-ACETAMINOPHEN 7.5-325 MG PO TABS: 1.0000 | ORAL_TABLET | ORAL | 0 refills | Status: DC | PRN

## 2017-03-03 MED ORDER — KETOROLAC TROMETHAMINE 15 MG/ML IJ SOLN
15.0000 mg | Freq: Three times a day (TID) | INTRAMUSCULAR | Status: DC | PRN
  Administered 2017-03-03: 15 mg via INTRAVENOUS
  Filled 2017-03-03: qty 1

## 2017-03-03 MED ORDER — HYDRALAZINE HCL 20 MG/ML IJ SOLN: 10.0000 mg | INTRAMUSCULAR | Status: DC | PRN

## 2017-03-03 MED ORDER — ZOLPIDEM TARTRATE 5 MG PO TABS: 5.0000 mg | ORAL_TABLET | Freq: Every evening | ORAL | Status: DC | PRN

## 2017-03-03 MED ORDER — DIPHENHYDRAMINE HCL 12.5 MG/5ML PO ELIX
12.5000 mg | ORAL_SOLUTION | Freq: Four times a day (QID) | ORAL | Status: DC | PRN
  Filled 2017-03-03: qty 5

## 2017-03-03 MED ORDER — PROPOFOL 10 MG/ML IV BOLUS
INTRAVENOUS | Status: DC | PRN
  Administered 2017-03-03: 130 mg via INTRAVENOUS

## 2017-03-03 MED ORDER — FENTANYL CITRATE (PF) 100 MCG/2ML IJ SOLN
INTRAMUSCULAR | Status: DC | PRN
  Administered 2017-03-03 (×2): 50 ug via INTRAVENOUS

## 2017-03-03 MED ORDER — ONDANSETRON HCL 4 MG/2ML IJ SOLN
INTRAMUSCULAR | Status: AC
  Filled 2017-03-03: qty 2

## 2017-03-03 MED ORDER — MIDAZOLAM HCL 2 MG/2ML IJ SOLN
INTRAMUSCULAR | Status: DC | PRN
  Administered 2017-03-03: 1 mg via INTRAVENOUS

## 2017-03-03 MED ORDER — LACTATED RINGERS IV SOLN
INTRAVENOUS | Status: DC
  Administered 2017-03-03 (×2): via INTRAVENOUS

## 2017-03-03 MED ORDER — DEXAMETHASONE SODIUM PHOSPHATE 10 MG/ML IJ SOLN
INTRAMUSCULAR | Status: DC | PRN
  Administered 2017-03-03: 10 mg via INTRAVENOUS

## 2017-03-03 SURGICAL SUPPLY — 31 items
BLADE SURG 15 STRL LF DISP TIS (BLADE) ×1 IMPLANT
BLADE SURG 15 STRL SS (BLADE) ×2
CHLORAPREP W/TINT 26ML (MISCELLANEOUS) ×3 IMPLANT
DERMABOND ADVANCED (GAUZE/BANDAGES/DRESSINGS) ×2
DERMABOND ADVANCED .7 DNX12 (GAUZE/BANDAGES/DRESSINGS) ×1 IMPLANT
DRAIN PENROSE 1/4X12 LTX (DRAIN) IMPLANT
DRAPE LAPAROTOMY T 102X78X121 (DRAPES) ×3 IMPLANT
ELECT CAUTERY BLADE TIP 2.5 (TIP) ×3
ELECT REM PT RETURN 9FT ADLT (ELECTROSURGICAL) ×3
ELECTRODE CAUTERY BLDE TIP 2.5 (TIP) ×1 IMPLANT
ELECTRODE REM PT RTRN 9FT ADLT (ELECTROSURGICAL) ×1 IMPLANT
GLOVE BIO SURGEON STRL SZ7.5 (GLOVE) ×3 IMPLANT
GLOVE INDICATOR 8.0 STRL GRN (GLOVE) ×3 IMPLANT
GOWN STRL REUS W/ TWL LRG LVL3 (GOWN DISPOSABLE) ×2 IMPLANT
GOWN STRL REUS W/TWL LRG LVL3 (GOWN DISPOSABLE) ×4
KIT RM TURNOVER STRD PROC AR (KITS) ×3 IMPLANT
MESH VENTRALEX ST 2.5 CRC MED (Mesh General) ×3 IMPLANT
NEEDLE HYPO 25GX1X1/2 BEV (NEEDLE) ×3 IMPLANT
NS IRRIG 500ML POUR BTL (IV SOLUTION) ×3 IMPLANT
PACK BASIN MINOR ARMC (MISCELLANEOUS) ×3 IMPLANT
STRAP SAFETY BODY (MISCELLANEOUS) ×3 IMPLANT
SUT ETHIBOND 0 MO6 C/R (SUTURE) ×3 IMPLANT
SUT MNCRL 4-0 (SUTURE) ×2
SUT MNCRL 4-0 27XMFL (SUTURE) ×1
SUT SILK 2 0 SH (SUTURE) IMPLANT
SUT VIC AB 2-0 CT2 27 (SUTURE) ×6 IMPLANT
SUT VIC AB 3-0 SH 27 (SUTURE) ×2
SUT VIC AB 3-0 SH 27X BRD (SUTURE) ×1 IMPLANT
SUTURE MNCRL 4-0 27XMF (SUTURE) ×1 IMPLANT
SYR 20CC LL (SYRINGE) ×3 IMPLANT
SYR BULB IRRIG 60ML STRL (SYRINGE) ×3 IMPLANT

## 2017-03-03 NOTE — Anesthesia Preprocedure Evaluation (Addendum)
Anesthesia Evaluation  Patient identified by MRN, date of birth, ID band Patient awake    Reviewed: Allergy & Precautions, H&P , NPO status , Patient's Chart, lab work & pertinent test results  History of Anesthesia Complications Negative for: history of anesthetic complications  Airway Mallampati: III  TM Distance: <3 FB Neck ROM: limited    Dental  (+) Poor Dentition, Missing, Lower Dentures, Upper Dentures, Edentulous Lower, Edentulous Upper   Pulmonary shortness of breath and with exertion, COPD, former smoker,           Cardiovascular Exercise Tolerance: Good (-) angina(-) Past MI negative cardio ROS       Neuro/Psych  Neuromuscular disease negative psych ROS   GI/Hepatic Neg liver ROS, hiatal hernia, GERD  Medicated and Controlled,  Endo/Other  negative endocrine ROS  Renal/GU      Musculoskeletal   Abdominal   Peds  Hematology negative hematology ROS (+)   Anesthesia Other Findings Low suspicion for this tumor being a pheochromocytoma per Dr. Erlene Quan  Past Medical History: 1997: Breast cancer ([place])     Comment:  right breast lumpectomy with radiation 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) No date: GERD (gastroesophageal reflux disease) No date: History of hiatal hernia No date: Skin cancer 1998: Throat cancer [facility])  Past Surgical History: 1997: BREAST BIOPSY; Right     Comment:  positive 1997: BREAST LUMPECTOMY; Right No date: ESOPHAGUS SURGERY No date: KNEE SURGERY; Right No date: SKIN CANCER EXCISION 1998: THROAT SURGERY     Comment:  throat cancer  No date: TUBAL LIGATION  BMI    Body Mass Index:  26.26 kg/m      Reproductive/Obstetrics negative OB ROS                             Anesthesia Physical  Anesthesia Plan  ASA: III  Anesthesia Plan: General ETT   Post-op Pain Management:    Induction: Intravenous  PONV Risk Score and Plan: 4 or greater and  Ondansetron, Dexamethasone, Midazolam and Treatment may vary due to age or medical condition  Airway Management Planned: Oral ETT  Additional Equipment:   Intra-op Plan:   Post-operative Plan: Extubation in OR  Informed Consent: I have reviewed the patients History and Physical, chart, labs and discussed the procedure including the risks, benefits and alternatives for the proposed anesthesia with the patient or authorized representative who has indicated his/her understanding and acceptance.   Dental Advisory Given  Plan Discussed with: Anesthesiologist, CRNA and Surgeon  Anesthesia Plan Comments: (Patient consented for risks of anesthesia including but not limited to:  - adverse reactions to medications - damage to teeth, lips or other oral mucosa - sore throat or hoarseness - Damage to heart, brain, lungs or loss of life  Patient voiced understanding.)       Anesthesia Quick Evaluation

## 2017-03-03 NOTE — Transfer of Care (Signed)
Immediate Anesthesia Transfer of Care Note  Patient: Denise Watts  Procedure(s) Performed: HERNIA REPAIR VENTRAL ADULT (N/A )  Patient Location: PACU  Anesthesia Type:General  Level of Consciousness: awake, alert  and oriented  Airway & Oxygen Therapy: Patient Spontanous Breathing and Patient connected to nasal cannula oxygen  Post-op Assessment: Report given to RN and Post -op Vital signs reviewed and stable  Post vital signs: Reviewed and stable  Last Vitals:  Vitals:   03/03/17 1039 03/03/17 1350  BP: (!) 135/59 (!) 153/82  Pulse: 81 98  Resp: 18 16  Temp: 36.7 C   SpO2: 96% 100%    Last Pain:  Vitals:   03/03/17 1039  TempSrc: Tympanic  PainSc: 3          Complications: No apparent anesthesia complications

## 2017-03-03 NOTE — OR Nursing (Signed)
1818Transferred to 207  Report given.  Pain rated 5

## 2017-03-03 NOTE — Brief Op Note (Signed)
03/03/2017  1:44 PM  PATIENT:  Denise Watts  61 y.o. female  PRE-OPERATIVE DIAGNOSIS:  INCISIONAL HERNIA  POST-OPERATIVE DIAGNOSIS:  INCISIONAL HERNIA  PROCEDURE:  Procedure(s): HERNIA REPAIR VENTRAL ADULT (N/A)  SURGEON:  Surgeon(s) and Role:    * Clayburn Pert, MD - Primary  PHYSICIAN ASSISTANT:   ASSISTANTS: none   ANESTHESIA:   general  EBL:  20 mL   BLOOD ADMINISTERED:none  DRAINS: none   LOCAL MEDICATIONS USED:  OTHER exparell  SPECIMEN:  No Specimen  DISPOSITION OF SPECIMEN:  N/A  COUNTS:  YES  TOURNIQUET:  * No tourniquets in log *  DICTATION: .Dragon Dictation  PLAN OF CARE: Discharge to home after PACU  PATIENT DISPOSITION:  PACU - hemodynamically stable.   Delay start of Pharmacological VTE agent (>24hrs) due to surgical blood loss or risk of bleeding: not applicable

## 2017-03-03 NOTE — Anesthesia Postprocedure Evaluation (Signed)
Anesthesia Post Note  Patient: Denise Watts  Procedure(s) Performed: HERNIA REPAIR VENTRAL ADULT (N/A )  Patient location during evaluation: PACU Anesthesia Type: General Level of consciousness: awake and alert Pain management: pain level controlled Vital Signs Assessment: post-procedure vital signs reviewed and stable Respiratory status: spontaneous breathing, nonlabored ventilation, respiratory function stable and patient connected to nasal cannula oxygen Cardiovascular status: blood pressure returned to baseline and stable Postop Assessment: no apparent nausea or vomiting Anesthetic complications: no     Last Vitals:  Vitals:   03/03/17 1435 03/03/17 1450  BP: (!) 145/71 (!) 148/79  Pulse: 81 82  Resp: 12 14  Temp:    SpO2: 99% 98%    Last Pain:  Vitals:   03/03/17 1420  TempSrc:   PainSc: 0-No pain                 Precious Haws Piscitello

## 2017-03-03 NOTE — Discharge Instructions (Addendum)
PATIENT INSTRUCTIONS HERNIA  FOLLOW-UP:  Please make an appointment with your physician in 1 week(s).  Call your physician immediately if you have any fevers greater than 102.5, drainage from you wound that is not clear or looks infected, persistent bleeding, increasing abdominal pain, problems urinating, or persistent nausea/vomiting.    WOUND CARE INSTRUCTIONS:  Keep a dry clean dressing on the wound if there is drainage. Once the wound has quit draining you may leave it open to air.  If clothing rubs against the wound or causes irritation and the wound is not draining you may cover it with a dry dressing during the daytime.  Try to keep the wound dry and avoid ointments on the wound unless directed to do so.  If the wound becomes bright red and painful or starts to drain infected material that is not clear, please contact your physician immediately.  If the wound is mildly pink and has a thick firm ridge underneath it, this is normal, and is referred to as a healing ridge.  This will resolve over the next 4-6 weeks.  DIET:  You may eat any foods that you can tolerate.  It is a good idea to eat a high fiber diet and take in plenty of fluids to prevent constipation.  If you do become constipated you may want to take a mild laxative or take ducolax tablets on a daily basis until your bowel habits are regular.  Constipation can be very uncomfortable, along with straining, after recent abdominal surgery.  ACTIVITY:  You are encouraged to cough and deep breath or use your incentive spirometer if you were given one, every 15-30 minutes when awake.  This will help prevent respiratory complications and low grade fevers post-operatively.  You may want to hug a pillow when coughing and sneezing to add additional support to the surgical area which will decrease pain during these times.  You are encouraged to walk and engage in light activity for the next two weeks.  You should not lift more than 20 pounds during  this time frame as it could put you at increased risk for a hernia recurrence.  Twenty pounds is roughly equivalent to a plastic bag of groceries.  It is okay to shower in 24 hours.  Do not submerge for at least 2 weeks.  MEDICATIONS:  Try to take narcotic medications and anti-inflammatory medications, such as tylenol, ibuprofen, naprosyn, etc., with food.  This will minimize stomach upset from the medication.  Should you develop nausea and vomiting from the pain medication, or develop a rash, please discontinue the medication and contact your physician.  You should not drive, make important decisions, or operate machinery when taking narcotic pain medication.  QUESTIONS:  Please feel free to call your physician or the hospital operator if you have any questions, and they will be glad to assist you.    AMBULATORY SURGERY  DISCHARGE INSTRUCTIONS   1) The drugs that you were given will stay in your system until tomorrow so for the next 24 hours you should not:  A) Drive an automobile B) Make any legal decisions C) Drink any alcoholic beverage   2) You may resume regular meals tomorrow.  Today it is better to start with liquids and gradually work up to solid foods.  You may eat anything you prefer, but it is better to start with liquids, then soup and crackers, and gradually work up to solid foods.   3) Please notify your doctor immediately if you  have any unusual bleeding, trouble breathing, redness and pain at the surgery site, drainage, fever, or pain not relieved by medication.    4) Additional Instructions:   Please contact your physician with any problems or Same Day Surgery at [phone], Monday through Friday 6 am to 4 pm, or [place] [facility] number at [phone].   Follow all MD discharge instructions. Take all medications as prescribed. Keep all follow up appointments. If your symptoms return, call your doctor. If you experience any new symptoms that are of  concern to you or that are bothersome to you, call your doctor. For all questions and/or concerns, call your doctor.  If you experience any bleeding or discharge from your incision sites, redness or swelling at your incision sites, fever, pain uncontrolled by medication, increase in pain, or any nausea, call your doctor.   If you have a medical emergency, call 911

## 2017-03-03 NOTE — OR Nursing (Signed)
1648 Abd binder applied as ordered

## 2017-03-03 NOTE — Interval H&P Note (Signed)
History and Physical Interval Note:  03/03/2017 11:59 AM  Denise Watts  has presented today for surgery, with the diagnosis of INCISIONAL HERNIA  The various methods of treatment have been discussed with the patient and family. After consideration of risks, benefits and other options for treatment, the patient has consented to  Procedure(s): HERNIA REPAIR VENTRAL ADULT (N/A) as a surgical intervention .  The patient's history has been reviewed, patient examined, no change in status, stable for surgery.  I have reviewed the patient's chart and labs.  Questions were answered to the patient's satisfaction.     Clayburn Pert

## 2017-03-03 NOTE — Anesthesia Procedure Notes (Signed)
Procedure Name: Intubation Date/Time: 03/03/2017 12:27 PM Performed by: Allean Found Pre-anesthesia Checklist: Patient identified, Emergency Drugs available, Suction available, Patient being monitored and Timeout performed Patient Re-evaluated:Patient Re-evaluated prior to induction Oxygen Delivery Method: Circle system utilized Preoxygenation: Pre-oxygenation with 100% oxygen Induction Type: IV induction Ventilation: Mask ventilation without difficulty Grade View: Grade I Tube type: Oral Tube size: 7.0 mm Number of attempts: 1 Airway Equipment and Method: Stylet Placement Confirmation: ETT inserted through vocal cords under direct vision,  positive ETCO2 and breath sounds checked- equal and bilateral Secured at: 21 cm Tube secured with: Tape Dental Injury: Teeth and Oropharynx as per pre-operative assessment

## 2017-03-03 NOTE — H&P (View-Only) (Signed)
Outpatient Surgical Follow Up  02/27/2017  Denise Watts is an 61 y.o. female.   Chief Complaint  Patient presents with  . Follow-up    Incisional Hernia    HPI: 61 year old female returns to clinic today for follow-up of an incisional hernia.  Since her last visit she obtained a CT scan which again showed the incisional hernia but more importantly failed to show any new oncologic finding within her abdomen.  The hernia is noted to be fat-containing.  Patient continues to have abdominal pain and is taking her as needed oxycodone and Valium for relief.  She does report some relief when she takes them both.  She is tolerating a diet and denies any fevers, chills, nausea, vomiting, chest pain, shortness of breath, diarrhea, constipation.  Her primary complaint is of pain in her right upper quadrant at the site of her hernia.  Past Medical History:  Diagnosis Date  . Breast cancer [facility]) 1997   right breast lumpectomy with radiation  . 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 GERD (gastroesophageal reflux disease)   . History of hiatal hernia   . Skin cancer   . Throat cancer ([place]) 1998    Past Surgical History:  Procedure Laterality Date  . BREAST BIOPSY Right 1997   positive  . BREAST LUMPECTOMY Right 1997  . ESOPHAGUS SURGERY    . KNEE SURGERY Right   . ROBOTIC ADRENALECTOMY Right 12/28/2016   Procedure: ROBOTIC ADRENALECTOMY;  Surgeon: Hollice Espy, MD;  Location: [facility] ORS;  Service: Urology;  Laterality: Right;  . SKIN CANCER EXCISION    . THROAT SURGERY  1998   throat cancer   . TUBAL LIGATION      Family History  Problem Relation Age of Onset  . Diabetes Mother   . Heart attack Mother   . Lung cancer Sister   . Bone cancer Sister   . Breast cancer Maternal Aunt 41  . Prostate cancer Neg Hx   . Kidney cancer Neg Hx   . Bladder Cancer Neg Hx     Social History:  reports that she has quit smoking. She has never used smokeless tobacco. She reports that she does not  drink alcohol or use drugs.  Allergies: No Known Allergies  Medications reviewed.    ROS Multiport review of systems was completed, all pertinent positives and negatives are documented within the HPI and the remainder are negative   BP 140/90   Pulse 89   Temp 98.1 F (36.7 C) (Oral)   Ht 5\' 1"  (1.549 m)   Wt 61.1 kg (134 lb 9.6 oz)   BMI 25.43 kg/m   Physical Exam General: No acute distress Neck: Supple nontender Chest: Clear to auscultation without use of accessory muscles. Heart: Regular rate and rhythm Abdomen: Soft, markedly tender to palpation at the visible and palpable right upper quadrant hernia site, nondistended.  No evidence of rebound, guarding, peritonitis. Extremities: Moves all extremities well.    No results found for this or any previous visit (from the past 48 hour(s)). No results found.  CT scan results:Chest Impression:  1. Stable dominant nodule in the LEFT lower lobe. 2. Small bilateral ground-glass nodules are unchanged. 3. Single new small subpleural LEFT lower lobe nodule is likely benign.  Abdomen / Pelvis Impression:  1. Interval RIGHT adrenalectomy. 2. Nodularity along the RIGHT gonadal vein and thrombus within the RIGHT vein vein presumed related to RIGHT adrenalectomy procedure. 3. No evidence of metastatic disease in the abdomen  pelvis. No peritoneal carcinomatosis 4. Abdominal wall hernia at  laparoscopic port sites.  Assessment/Plan:  1. Incisional hernia, without obstruction or gangrene 61 year old female with an incisional hernia in her right upper quadrant incision site from her adrenalectomy.  Discussed the treatment options of open versus laparoscopic incisional hernia repair.  Both surgeries were described in detail to include the risk, benefits, alternatives.  After this discussion the patient elected to proceed with an open incisional hernia repair.  All questions were answered to her satisfaction.  We will plan to  proceed to surgery this Friday, 2 November.  A total of 25 minutes was used on this encounter with greater than 25% of it used for counseling or coordination of care.     Clayburn Pert, MD FACS General Surgeon  02/27/2017,4:41 PM

## 2017-03-03 NOTE — Anesthesia Post-op Follow-up Note (Signed)
Anesthesia QCDR form completed.        

## 2017-03-03 NOTE — OR Nursing (Signed)
60 Dr. Marcello Moores notified of severe pain.  Groaning and grabbing her abd with stabbing pain.  Orders for fentanyl received to be be followed by versed if no relief. 33  Dr. Adonis Huguenin visited and ordered an abd binder.  Instructed to notify him if inadequate pain relief.

## 2017-03-03 NOTE — Op Note (Signed)
   Pre-operative Diagnosis: Incisional hernia  Post-operative Diagnosis: Same  Procedure performed: Open incisional hernia repair  Surgeon: Clayburn Pert   Assistants: None  Anesthesia: General endotracheal anesthesia  ASA Class: 2  Surgeon: Clayburn Pert, MD FACS  Anesthesia: Gen. with endotracheal tube  Assistant: None  Procedure Details  The patient was seen again in the Holding Room. The benefits, complications, treatment options, and expected outcomes were discussed with the patient. The risks of bleeding, infection, recurrence of symptoms, failure to resolve symptoms,  bowel injury, any of which could require further surgery were reviewed with the patient.   The patient was taken to Operating Room, identified as Denise Watts and the procedure verified.  A Time Out was held and the above information confirmed.  Prior to the induction of general anesthesia, antibiotic prophylaxis was administered. VTE prophylaxis was in place. General endotracheal anesthesia was then administered and tolerated well. After the induction, the abdomen was prepped with Chloraprep and draped in the sterile fashion. The patient was positioned in the supine position.  The procedure began by localizing the right upper quadrant incisional site with a 1% lidocaine solution where her hernia was located.  The previous incision was opened with a 15 blade scalpel.  It was enlarged medially and laterally to allow for dissection.  Using accommodation of blunt dissection and Bovie electrocautery was taken down to level of fascia.  Herniating omentum was identified and circumferentially freed up with electrocautery.  Once a fascial edge was clearly identified it was circumferentially freed from all of its attachments and the omentum was returned to the patient's abdomen.  The fascial defect was measured approximately 3 cm in greatest diameter and a 6.4 cm ventralex mesh was brought up to the field.  The mesh was  easily inserted into the abdomen and secured with interrupted 0 Ethibond sutures in a craniocaudal fashion perpendicular to our skin incision.  With the interrupted sutures in place they were tied secured closing the hernia defect and securing the mesh with ease.  The entire operative field was then completely irrigated.  Meticulous hemostasis was insured with directed electrocautery.  A field block was then created using liposomal bupivacaine.  Continued our closure the Scarpa's fascia was approximated with an interrupted 2-0 Vicryl suture.  The deep dermal tissue was reapproximated with interrupted 3-0 Vicryl suture.  The skin was closed with a running subcuticular 4-0 Monocryl and then sealed with Dermabond.  The patient tolerated the procedure well.  She was awoken from general anesthesia and transferred to the PACU in good condition.  There were no immediate complications and all counts were correct at the end of the procedure.  Findings: 3 cm incisional hernia   Estimated Blood Loss: 30 mL         Drains: None         Specimens: None          Complications: None                  Condition: Good   Clayburn Pert, MD, FACS

## 2017-03-03 NOTE — Progress Notes (Signed)
  Patient unable to tolerate pain postoperatively.  Discussed the options with the patient and her family.  They elect to stay overnight for continued pain control  Orders placed for observation with likely discharge home tomorrow.  Clayburn Pert, MD [facility]  Day [facility] [phone] Night [facility] [phone]

## 2017-03-04 DIAGNOSIS — K432 Incisional hernia without obstruction or gangrene: Secondary | ICD-10-CM | POA: Diagnosis not present

## 2017-03-04 MED ORDER — PANTOPRAZOLE SODIUM 40 MG PO TBEC
80.0000 mg | DELAYED_RELEASE_TABLET | Freq: Every day | ORAL | Status: DC
  Administered 2017-03-04: 80 mg via ORAL
  Filled 2017-03-04: qty 2

## 2017-03-04 NOTE — Discharge Summary (Signed)
Patient ID: Denise Watts MRN: [id number] DOB/AGE: 1956/01/13 61 y.o.  Admit date: 03/03/2017 Discharge date: 03/04/2017  Discharge Diagnoses:  Ventral hernia  Procedures Performed: Open Ventral Hernia repair  Discharged Condition: good  Hospital Course: Patient underwent scheduled ventral hernia repair. Required overnight observation secondary to abdominal pain. Able to be discharged home the first post operative day.  Discharge Orders: Discharge Instructions    Call MD for:  persistant nausea and vomiting    Complete by:  As directed    Call MD for:  persistant nausea and vomiting    Complete by:  As directed    Call MD for:  redness, tenderness, or signs of infection (pain, swelling, redness, odor or green/yellow discharge around incision site)    Complete by:  As directed    Call MD for:  redness, tenderness, or signs of infection (pain, swelling, redness, odor or green/yellow discharge around incision site)    Complete by:  As directed    Call MD for:  severe uncontrolled pain    Complete by:  As directed    Call MD for:  severe uncontrolled pain    Complete by:  As directed    Call MD for:  temperature >100.4    Complete by:  As directed    Call MD for:  temperature >100.4    Complete by:  As directed    Diet - low sodium heart healthy    Complete by:  As directed    Diet - low sodium heart healthy    Complete by:  As directed    Increase activity slowly    Complete by:  As directed    Increase activity slowly    Complete by:  As directed       Disposition: 01-Home or Self Care  Discharge Medications: Allergies as of 03/04/2017   No Known Allergies     Medication List    TAKE these medications   calcium carbonate 500 MG chewable tablet Commonly known as:  TUMS - dosed in mg elemental calcium Chew 1 tablet by mouth daily.   diazepam 5 MG tablet Commonly known as:  VALIUM Take 1 tablet (5 mg total) by mouth every 6 (six) hours as needed for anxiety or  muscle spasms. What changed:  reasons to take this   docusate sodium 100 MG capsule Commonly known as:  COLACE Take 1 capsule (100 mg total) by mouth 2 (two) times daily.   multivitamin with minerals Tabs tablet Take 1 tablet by mouth daily.   omeprazole 40 MG capsule Commonly known as:  PRILOSEC TAKE 1 CAPSULE BY MOUTH EVERY DAY. What changed:  See the new instructions.   oxyCODONE-acetaminophen 7.5-325 MG tablet Commonly known as:  PERCOCET Take 1 tablet by mouth every 4 (four) hours as needed for severe pain.   VITAMIN B-12 PO Take 2 tablets by mouth daily. GUMMIES        Follwup: Follow-up Information    Clayburn Pert, MD Follow up in 1 week(s).   Specialty:  General Surgery Why:  Report to clinic at 10:45 for an 11:00 AM appointment on Friday 11/9 in the [facility] information: [place] [address] [phone]           Signed: Clayburn Pert 03/04/2017, 12:21 PM

## 2017-03-04 NOTE — Progress Notes (Signed)
1 Day Post-Op   Subjective: Patient kept overnight in observation secondary to postoperative pain from her ventral hernia repair.  Patient reports that her pain gradually became better controlled over the night.  She has been tolerating a diet well.  She states the abdominal binder has helped.  Vital signs in last 24 hours: Temp:  [97.4 F (36.3 C)-98.4 F (36.9 C)] 97.5 F (36.4 C) (11/03 0417) Pulse Rate:  [77-98] 85 (11/03 0417) Resp:  [11-18] 18 (11/03 0417) BP: (111-154)/(59-82) 111/60 (11/03 0417) SpO2:  [92 %-100 %] 96 % (11/03 0417) Weight:  [60.8 kg (134 lb)] 60.8 kg (134 lb) (11/02 1039)    Intake/Output from previous day: 11/02 0701 - 11/03 0700 In: 1190 [P.O.:390; I.V.:800] Out: 760 [Urine:750; Blood:10]  GI: Abdomen is soft, appropriately tender to palpation at her incision sites, nondistended.  Transverse incision from her incisional hernia repair is intact with Dermabond in place.  No evidence of erythema or drainage.  Lab Results:  CBC  Recent Labs  03/03/17 2029  WBC 7.2  HGB 14.5  HCT 43.1  PLT 181   CMP     Component Value Date/Time   NA 138 02/17/2017 1113   NA 143 04/08/2014 0409   K 4.5 02/17/2017 1113   K 3.5 04/08/2014 0409   CL 102 02/17/2017 1113   CL 113 (H) 04/08/2014 0409   CO2 28 02/17/2017 1113   CO2 26 04/08/2014 0409   GLUCOSE 102 (H) 02/17/2017 1113   GLUCOSE 84 04/08/2014 0409   BUN 11 02/17/2017 1113   BUN 6 (L) 04/08/2014 0409   CREATININE 0.84 03/03/2017 2029   CREATININE 0.67 04/08/2014 0409   CALCIUM 9.4 02/17/2017 1113   CALCIUM 7.5 (L) 04/08/2014 0409   PROT 7.1 11/14/2016 0823   PROT 6.4 11/26/2012 1019   ALBUMIN 3.9 11/14/2016 0823   ALBUMIN 3.5 11/26/2012 1019   AST 49 (H) 11/14/2016 0823   AST 25 11/26/2012 1019   ALT 27 11/14/2016 0823   ALT 21 11/26/2012 1019   ALKPHOS 76 11/14/2016 0823   ALKPHOS 87 11/26/2012 1019   BILITOT 0.6 11/14/2016 0823   BILITOT 0.4 11/26/2012 1019   GFRNONAA >60 03/03/2017  2029   GFRNONAA >60 04/08/2014 0409   GFRNONAA >60 11/26/2012 1019   GFRAA >60 03/03/2017 2029   GFRAA >60 04/08/2014 0409   GFRAA >60 11/26/2012 1019   PT/INR No results for input(s): LABPROT, INR in the last 72 hours.  Studies/Results: No results found.  Assessment/Plan: 61 year old female status post incisional hernia repair.  Pain appears much better controlled.  Discussed with the patient that we would monitor this morning to ensure that her pain remained controlled on just oral medications.  Encourage ambulation, incentive spirometer, oral intake.  Likely discharge home this afternoon.   Clayburn Pert, MD [facility]  Day [facility] [phone] Night [facility] [phone]  03/04/2017

## 2017-03-04 NOTE — Progress Notes (Signed)
Pt d/c home; d/c instructions reviewed w/ pt; pt understanding was verbalized; IV removed, catheter in tact, gauze dressing applied; all pt questions answered; pt verbalized that all pt belongings were accounted for; pt left unit via wheelchair accompanied by staff 

## 2017-03-06 ENCOUNTER — Encounter: Payer: Self-pay | Admitting: General Surgery

## 2017-03-10 ENCOUNTER — Encounter: Payer: Self-pay | Admitting: General Surgery

## 2017-03-10 ENCOUNTER — Encounter: Payer: Self-pay | Admitting: Urology

## 2017-03-10 ENCOUNTER — Ambulatory Visit (INDEPENDENT_AMBULATORY_CARE_PROVIDER_SITE_OTHER): Payer: PRIVATE HEALTH INSURANCE | Admitting: General Surgery

## 2017-03-10 ENCOUNTER — Ambulatory Visit (INDEPENDENT_AMBULATORY_CARE_PROVIDER_SITE_OTHER): Payer: PRIVATE HEALTH INSURANCE | Admitting: Urology

## 2017-03-10 VITALS — BP 113/72 | HR 88 | Ht 61.0 in | Wt 132.0 lb

## 2017-03-10 VITALS — BP 115/80 | HR 78 | Temp 97.5°F | Ht 61.0 in | Wt 131.0 lb

## 2017-03-10 DIAGNOSIS — K432 Incisional hernia without obstruction or gangrene: Secondary | ICD-10-CM

## 2017-03-10 DIAGNOSIS — E278 Other specified disorders of adrenal gland: Secondary | ICD-10-CM

## 2017-03-10 DIAGNOSIS — Z4889 Encounter for other specified surgical aftercare: Secondary | ICD-10-CM

## 2017-03-10 DIAGNOSIS — E279 Disorder of adrenal gland, unspecified: Secondary | ICD-10-CM

## 2017-03-10 MED ORDER — GABAPENTIN 300 MG PO CAPS: 300.0000 mg | ORAL_CAPSULE | Freq: Three times a day (TID) | ORAL | 0 refills | Status: DC

## 2017-03-10 MED ORDER — OXYCODONE-ACETAMINOPHEN 7.5-325 MG PO TABS: 1.0000 | ORAL_TABLET | ORAL | 0 refills | Status: DC | PRN

## 2017-03-10 NOTE — Progress Notes (Signed)
Outpatient Surgical Follow Up  03/10/2017  Denise Watts is an 61 y.o. female.   Chief Complaint  Patient presents with  . Routine Post Op    Open incisional hernia repair- 11/2 -Dr. Adonis Huguenin    HPI: 61 year old female returns to clinic 1 week status post open incisional hernia repair.  Patient and her husband report that she fell from standing 2 days ago.  Her pain had been improving until that point.  Husband reports that the patient was loopy from having taken Valium prior to falling.  Patient reports that her pain has continued to worsen despite the medications.  She does get some relief from the Percocet.  I have self discontinued the Valium since the fall.  She is tolerating a diet but not eating much.  She denies any fevers, chills, nausea, vomiting, chest pain, shortness of breath, diarrhea, constipation.  Past Medical History:  Diagnosis Date  . Breast cancer [facility]) 1997   right breast lumpectomy with radiation  . 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 Cough 03/02/2017   INTERMTTENT DRY COUGH  . GERD (gastroesophageal reflux disease)   . History of hiatal hernia   . Skin cancer   . Throat cancer ([place]) 1998    Past Surgical History:  Procedure Laterality Date  . BREAST BIOPSY Right 1997   positive  . BREAST LUMPECTOMY Right 1997  . ESOPHAGUS SURGERY    . KNEE SURGERY Right   . SKIN CANCER EXCISION    . THROAT SURGERY  1998   throat cancer   . TUBAL LIGATION      Family History  Problem Relation Age of Onset  . Diabetes Mother   . Heart attack Mother   . Lung cancer Sister   . Bone cancer Sister   . Breast cancer Maternal Aunt 41  . Prostate cancer Neg Hx   . Kidney cancer Neg Hx   . Bladder Cancer Neg Hx     Social History:  reports that she has quit smoking. she has never used smokeless tobacco. She reports that she does not drink alcohol or use drugs.  Allergies: No Known Allergies  Medications reviewed.    ROS Multipoint review of systems was  completed.  All pertinent positives and negatives are documented in the HPI and the remainder are negative   BP 115/80   Pulse 78   Temp (!) 97.5 F (36.4 C) (Oral)   Ht 5\' 1"  (1.549 m)   Wt 59.4 kg (131 lb)   BMI 24.75 kg/m   Physical Exam General: No acute distress Chest: Clear to auscultation Heart: Regular rate and rhythm Abdomen: Soft, nondistended.  Tender to palpation in all quadrants especially around the incision.  No evidence of recurrence or infection on exam.     No results found for this or any previous visit (from the past 37 hour(s)). No results found.  Assessment/Plan:  1. Aftercare following surgery 61 year old female status post open incisional hernia repair.  Having significant pain.  Provided with refill for narcotic today and new prescription for a short course of gabapentin.  I will refer her to the pain clinic for further evaluation of her acute on chronic abdominal pains.  Patient will follow up in 10 days for repeat check.     Clayburn Pert, MD FACS General Surgeon  03/10/2017,12:24 PM

## 2017-03-10 NOTE — Progress Notes (Signed)
03/10/2017 11:47 AM   Denise Watts 1955/12/27 [id number]  Referring provider: Margo Common, [place], [place] [address]    HPI: 61 year old female who returns s/p robotic right adrenalectomy for 5 cm adrenal tumor. Surgical pathology was consistent with high-grade carcinoma with extensive necrosis. Margins were negative.    She is been seen and evaluated in follow-up by her oncologist, Dr. Janese Banks.  Given unclear etiology of the tumor and only known lesion, preference was for surveillance at this time.  Most recent imaging on 02/17/2017 shows no obvious new metastatic disease.  She does have some changes related to right adrenalectomy.  Unfortunately, her postoperative course has been complicated by an incisional hernia.  She underwent a repair with mesh last week with Dr. Adonis Huguenin.    Today, she is in a significant amount of pain.  She reports that she was taking Ativan and became dizzy.  This resulted in a fall.  Since then, she had increased pain in her incisional site.  She is seeing general surgery just following this appointment.  PMH: Past Medical History:  Diagnosis Date  . Breast cancer [facility]) 1997   right breast lumpectomy with radiation  . 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 Cough 03/02/2017   INTERMTTENT DRY COUGH  . GERD (gastroesophageal reflux disease)   . History of hiatal hernia   . Skin cancer   . Throat cancer [facility]) 1998    Surgical History: Past Surgical History:  Procedure Laterality Date  . BREAST BIOPSY Right 1997   positive  . BREAST LUMPECTOMY Right 1997  . ESOPHAGUS SURGERY    . KNEE SURGERY Right   . SKIN CANCER EXCISION    . THROAT SURGERY  1998   throat cancer   . TUBAL LIGATION      Home Medications:  Allergies as of 03/10/2017   No Known Allergies     Medication List        Accurate as of 03/10/17 11:47 AM. Always use your most recent med list.          calcium carbonate 500 MG chewable tablet Commonly  known as:  TUMS - dosed in mg elemental calcium Chew 1 tablet by mouth daily.   diazepam 5 MG tablet Commonly known as:  VALIUM Take 1 tablet (5 mg total) by mouth every 6 (six) hours as needed for anxiety or muscle spasms.   gabapentin 300 MG capsule Commonly known as:  NEURONTIN Take 1 capsule (300 mg total) 3 (three) times daily by mouth.   multivitamin with minerals Tabs tablet Take 1 tablet by mouth daily.   omeprazole 40 MG capsule Commonly known as:  PRILOSEC TAKE 1 CAPSULE BY MOUTH EVERY DAY.   oxyCODONE-acetaminophen 7.5-325 MG tablet Commonly known as:  PERCOCET Take 1 tablet every 4 (four) hours as needed by mouth for severe pain.   VITAMIN B-12 PO Take 2 tablets by mouth daily. GUMMIES       Allergies: No Known Allergies  Family History: Family History  Problem Relation Age of Onset  . Diabetes Mother   . Heart attack Mother   . Lung cancer Sister   . Bone cancer Sister   . Breast cancer Maternal Aunt 41  . Prostate cancer Neg Hx   . Kidney cancer Neg Hx   . Bladder Cancer Neg Hx     Social History:  reports that she has quit smoking. she has never used smokeless tobacco. She reports that she does  not drink alcohol or use drugs.  ROS: UROLOGY Frequent Urination?: No Hard to postpone urination?: Yes Burning/pain with urination?: No Get up at night to urinate?: No Leakage of urine?: No Urine stream starts and stops?: No Trouble starting stream?: No Do you have to strain to urinate?: No Blood in urine?: No Urinary tract infection?: No Sexually transmitted disease?: No Injury to kidneys or bladder?: No Painful intercourse?: No Weak stream?: No Currently pregnant?: No Vaginal bleeding?: No Last menstrual period?: n  Gastrointestinal Nausea?: No Vomiting?: No Indigestion/heartburn?: No Diarrhea?: No Constipation?: Yes  Constitutional Fever: No Night sweats?: No Weight loss?: No Fatigue?: No  Skin Skin rash/lesions?: No Itching?:  No  Eyes Blurred vision?: Yes Double vision?: No  Ears/Nose/Throat Sore throat?: No Sinus problems?: No  Hematologic/Lymphatic Swollen glands?: No Easy bruising?: Yes  Cardiovascular Leg swelling?: No Chest pain?: No  Respiratory Cough?: Yes Shortness of breath?: Yes  Endocrine Excessive thirst?: Yes  Musculoskeletal Back pain?: Yes Joint pain?: No  Neurological Headaches?: No Dizziness?: Yes  Psychologic Depression?: No Anxiety?: No  Physical Exam: BP 113/72   Pulse 88   Ht 5\' 1"  (1.549 m)   Wt 132 lb (59.9 kg)   BMI 24.94 kg/m   Constitutional:  Alert and oriented, No acute distress. HEENT: [place] AT, moist mucus membranes.  Trachea midline, no masses. Cardiovascular: No clubbing, cyanosis, or edema. Respiratory: Normal respiratory effort, no increased work of breathing. GI: Abdomen is soft, Nondistended.  Incision appears to be healing well with Dermabond in place. Skin: No rashes, bruises or suspicious lesions. Neurologic: Grossly intact, no focal deficits, moving all 4 extremities. Psychiatric: Normal mood and affect.  Laboratory Data: Lab Results  Component Value Date   WBC 7.2 03/03/2017   HGB 14.5 03/03/2017   HCT 43.1 03/03/2017   MCV 95.5 03/03/2017   PLT 181 03/03/2017    Lab Results  Component Value Date   CREATININE 0.84 03/03/2017   Urinalysis N/a  Pertinent Imaging: STAT abdominal ultrasound ordered  Assessment & Plan:    1. RIGHT Adrenal Mass S/p right robotic adrenalectomy 12/2016 Surgical pathology review-followed by Dr. Janese Banks Defer further care to medical oncology  2. Incisional hernia Status post repair Follow-up with Dr. Adonis Huguenin  F/u prn   Hollice Espy, MD  [facility] [address] [place], [place] [address] [phone]

## 2017-03-10 NOTE — Patient Instructions (Addendum)
We have sent over a referral to the Pain Clinic they will contact you with an appointment.  If you do not hear from their office by Wednesday please give our office a call.  We have sent you home with a pain medication refill as well as a new prescription for Gabapentin. Please take as directed.  Please call our office if you have any questions or concerns.

## 2017-03-20 ENCOUNTER — Ambulatory Visit (INDEPENDENT_AMBULATORY_CARE_PROVIDER_SITE_OTHER): Payer: PRIVATE HEALTH INSURANCE | Admitting: General Surgery

## 2017-03-20 ENCOUNTER — Encounter: Payer: Self-pay | Admitting: General Surgery

## 2017-03-20 VITALS — BP 118/84 | HR 79 | Temp 98.0°F | Ht 61.0 in | Wt 131.0 lb

## 2017-03-20 DIAGNOSIS — Z4889 Encounter for other specified surgical aftercare: Secondary | ICD-10-CM

## 2017-03-20 NOTE — Patient Instructions (Addendum)
You are looking great! We advised that you start taking the pain medication less and less each day. We advise that you take no more than 4000mg  of tylenol a day when not taking the pain medication. Please keep in mind that the pain medication DOES HAVE tylenol in it and to NOT take both together!  However you can take ibuprofen and aleve as an alternate in between taking the pain medication.  We will se you back as listed below:  If you have any questions or concerns please give our office a call.

## 2017-03-20 NOTE — Progress Notes (Signed)
Outpatient Surgical Follow Up  03/20/2017  Denise Watts is an 61 y.o. female.   Chief Complaint  Patient presents with  . Routine Post Op    Open incisional hernia repair- 11/2 -Dr. Adonis Huguenin    HPI: 61 year old female returns to clinic now 17 days status post open incisional hernia repair.  Patient reports she is doing much better than the last visit.  Continues to have some pain to the area but not nearly as severe.  Her need for pain medication has decreased dramatically to where she is now only taking 2 pills/day.  She is eating well and having normal bowel function.  She denies any fevers, chills, nausea, vomiting, chest pain, shortness of breath.  Past Medical History:  Diagnosis Date  . Breast cancer [facility]) 1997   right breast lumpectomy with radiation  . 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 Cough 03/02/2017   INTERMTTENT DRY COUGH  . GERD (gastroesophageal reflux disease)   . History of hiatal hernia   . Skin cancer   . Throat cancer ([place]) 1998    Past Surgical History:  Procedure Laterality Date  . BREAST BIOPSY Right 1997   positive  . BREAST LUMPECTOMY Right 1997  . ESOPHAGUS SURGERY    . HERNIA REPAIR VENTRAL ADULT N/A 03/03/2017   Performed by Clayburn Pert, MD at [facility] ORS  . KNEE SURGERY Right   . ROBOTIC ADRENALECTOMY Right 12/28/2016   Performed by Hollice Espy, MD at [facility] ORS  . SKIN CANCER EXCISION    . THROAT SURGERY  1998   throat cancer   . TUBAL LIGATION      Family History  Problem Relation Age of Onset  . Diabetes Mother   . Heart attack Mother   . Lung cancer Sister   . Bone cancer Sister   . Breast cancer Maternal Aunt 41  . Prostate cancer Neg Hx   . Kidney cancer Neg Hx   . Bladder Cancer Neg Hx     Social History:  reports that she has quit smoking. she has never used smokeless tobacco. She reports that she does not drink alcohol or use drugs.  Allergies: No Known Allergies  Medications reviewed.    ROS A multipoint  review of systems was completed, all pertinent positives and negatives are documented within the HPI and the remainder are negative   BP 118/84   Pulse 79   Temp 98 F (36.7 C) (Oral)   Ht 5\' 1"  (1.549 m)   Wt 59.4 kg (131 lb)   BMI 24.75 kg/m   Physical Exam General: No acute distress Chest: Clear to auscultation Heart: Regular rate and rhythm Abdomen: Soft, appropriately tender to palpation at her incision site, nondistended.  Her right upper quadrant incision site is well approximated with a healing ridge that is palpable with no evidence of recurrence on exam.    No results found for this or any previous visit (from the past 48 hour(s)). No results found.  Assessment/Plan:  1. Aftercare following surgery 61 year old female status post incisional hernia repair.  Much improved.  Discussed continued lifting restrictions and techniques for weaning off of the narcotic pain medication.  Patient voiced understanding and will follow up in 2 weeks for repeat exam.  We will provide her with a note for work so that she can return next week.     Clayburn Pert, MD FACS General Surgeon  03/20/2017,4:26 PM

## 2017-04-03 ENCOUNTER — Other Ambulatory Visit: Payer: Self-pay

## 2017-04-03 ENCOUNTER — Ambulatory Visit (INDEPENDENT_AMBULATORY_CARE_PROVIDER_SITE_OTHER): Payer: PRIVATE HEALTH INSURANCE | Admitting: General Surgery

## 2017-04-03 ENCOUNTER — Encounter: Payer: Self-pay | Admitting: Nurse Practitioner

## 2017-04-03 ENCOUNTER — Other Ambulatory Visit: Payer: Self-pay | Admitting: Nurse Practitioner

## 2017-04-03 ENCOUNTER — Ambulatory Visit: Payer: PRIVATE HEALTH INSURANCE | Attending: Nurse Practitioner | Admitting: Nurse Practitioner

## 2017-04-03 ENCOUNTER — Encounter: Payer: Self-pay | Admitting: General Surgery

## 2017-04-03 VITALS — BP 125/76 | HR 70 | Temp 97.8°F | Resp 14 | Ht 61.0 in | Wt 130.0 lb

## 2017-04-03 VITALS — BP 139/84 | HR 72 | Temp 97.9°F | Ht 61.0 in | Wt 128.6 lb

## 2017-04-03 DIAGNOSIS — Z79891 Long term (current) use of opiate analgesic: Secondary | ICD-10-CM | POA: Diagnosis not present

## 2017-04-03 DIAGNOSIS — Z8521 Personal history of malignant neoplasm of larynx: Secondary | ICD-10-CM | POA: Insufficient documentation

## 2017-04-03 DIAGNOSIS — Z4889 Encounter for other specified surgical aftercare: Secondary | ICD-10-CM

## 2017-04-03 DIAGNOSIS — E279 Disorder of adrenal gland, unspecified: Secondary | ICD-10-CM | POA: Insufficient documentation

## 2017-04-03 DIAGNOSIS — Z87891 Personal history of nicotine dependence: Secondary | ICD-10-CM | POA: Insufficient documentation

## 2017-04-03 DIAGNOSIS — M545 Low back pain, unspecified: Secondary | ICD-10-CM

## 2017-04-03 DIAGNOSIS — M25561 Pain in right knee: Secondary | ICD-10-CM | POA: Diagnosis not present

## 2017-04-03 DIAGNOSIS — L03019 Cellulitis of unspecified finger: Secondary | ICD-10-CM | POA: Insufficient documentation

## 2017-04-03 DIAGNOSIS — G8929 Other chronic pain: Secondary | ICD-10-CM | POA: Insufficient documentation

## 2017-04-03 DIAGNOSIS — K219 Gastro-esophageal reflux disease without esophagitis: Secondary | ICD-10-CM | POA: Insufficient documentation

## 2017-04-03 DIAGNOSIS — Z79899 Other long term (current) drug therapy: Secondary | ICD-10-CM | POA: Diagnosis not present

## 2017-04-03 DIAGNOSIS — R918 Other nonspecific abnormal finding of lung field: Secondary | ICD-10-CM | POA: Diagnosis not present

## 2017-04-03 DIAGNOSIS — M25569 Pain in unspecified knee: Secondary | ICD-10-CM

## 2017-04-03 DIAGNOSIS — M899 Disorder of bone, unspecified: Secondary | ICD-10-CM

## 2017-04-03 DIAGNOSIS — R109 Unspecified abdominal pain: Secondary | ICD-10-CM | POA: Diagnosis not present

## 2017-04-03 DIAGNOSIS — K432 Incisional hernia without obstruction or gangrene: Secondary | ICD-10-CM | POA: Insufficient documentation

## 2017-04-03 DIAGNOSIS — G894 Chronic pain syndrome: Secondary | ICD-10-CM | POA: Diagnosis present

## 2017-04-03 DIAGNOSIS — Z789 Other specified health status: Secondary | ICD-10-CM

## 2017-04-03 DIAGNOSIS — G629 Polyneuropathy, unspecified: Secondary | ICD-10-CM | POA: Diagnosis not present

## 2017-04-03 DIAGNOSIS — M533 Sacrococcygeal disorders, not elsewhere classified: Secondary | ICD-10-CM | POA: Insufficient documentation

## 2017-04-03 MED ORDER — OXYCODONE-ACETAMINOPHEN 7.5-325 MG PO TABS: 1.0000 | ORAL_TABLET | ORAL | 0 refills | Status: DC | PRN

## 2017-04-03 MED ORDER — TRAMADOL HCL 50 MG PO TABS: 50.0000 mg | ORAL_TABLET | Freq: Four times a day (QID) | ORAL | 0 refills | Status: DC | PRN

## 2017-04-03 NOTE — Patient Instructions (Signed)

## 2017-04-03 NOTE — Patient Instructions (Signed)
Please give us a call in case you have any questions or concerns.  

## 2017-04-03 NOTE — Progress Notes (Signed)
Patient's Name: Denise Watts  MRN: [id number]  Referring Provider: Clayburn Pert, MD  DOB: 14-Aug-1955  PCP: Margo Common, PA  DOS: 04/03/2017  Note by: Dionisio David NP  Service setting: Ambulatory outpatient  Specialty: Interventional Pain Management  Location: [facility] (AMB) Pain Management Facility    Patient type: New Patient    Primary Reason(s) for Visit: Initial Patient Evaluation CC: Abdominal Pain (post surgery)  HPI  Denise Watts is a 61 y.o. year old, female patient, who comes today for an initial evaluation. 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[place]); Pelvic fracture ([place]); Tobacco use; History of fundoplication; History of throat cancer; Acid reflux; Onychia of finger; Gonalgia; Odontogenic tumor; Closed fracture of pubis ([place]); Closed fracture of a rib; Fatigue; Other fatigue; Multiple lung nodules on CT; History of right breast cancer; Peripheral neuropathy; RIGHT Adrenal Mass; Right adrenal mass ([place]); Incisional hernia, without obstruction or gangrene; S/P repair of ventral hernia; Chronic abdominal pain (Primary Area of Pain) ; Chronic low back pain (Secondary Area of Pain) (B) (R>L); Chronic knee pain [facility] Area of Pain) (R); Chronic pain syndrome; Disorder of bone, unspecified; Other specified health status; Other long term (current) drug therapy; Long term current use of opiate analgesic; and Chronic sacroiliac joint pain on their problem list.. Her primarily concern today is the Abdominal Pain (post surgery)  Pain Assessment: Location:   Abdomen Radiating: lower back Onset: More than a month ago Duration: Chronic pain Quality: Sharp Severity: 5 /10 (self-reported pain score)  Note: Reported level is compatible with observation.                          Effect on ADL:   Timing: Constant Modifying factors: heating pad, medications, walkling  Onset and Duration: Gradual and Present longer than 3  months Cause of pain: found a mass on CT scan. Severity: Getting better, NAS-11 at its worse: 10/10, NAS-11 at its best: 2/10, NAS-11 now: 5/10 and NAS-11 on the average: 5/10 Timing: Not influenced by the time of the day Aggravating Factors: Bending, Eating, Motion, Prolonged sitting, Prolonged standing, Squatting, Surgery made it worse and Twisting Alleviating Factors: Hot packs, Lying down, Medications, Resting and Warm showers or baths Associated Problems: Sweating, Swelling, Temperature changes, Weakness, Pain that wakes patient up and Pain that does not allow patient to sleep Quality of Pain: Agonizing, Deep, Sharp, Stabbing and Uncomfortable Previous Examinations or Tests: CT scan, Endoscopy and MRI scan Previous Treatments: Narcotic medications  The patient comes into the clinics today for the first time for a chronic pain management evaluation. According to the patient her primary area of pain is in her abdomen. She is status post adrenal gland removal 12/28/2016. She admits that she developed abdominal hernia. She had this removed on 03/03/2017. She has been having chronic pain since procedure. She denies any therapy. She did have recent images CT scan and ultrasound.  Her next area of pain is in her lower back. She states that it is midline. She denies any previous surgery, interventional therapy physical therapy or recent images. She denies any radiating pain into the lower extremities.  Her third area of pain is in her right knee. She admits that she has chronic pain secondary to injury. She admits that her knee was crashed in a work related injury approximately 3-4 years ago. She was hit the blades on a forklift. She did undergo reconstructive surgery. She denies  any previous interventional therapy or recent images.  Today I took the time to provide the patient with information regarding this pain practice. The patient was informed that the practice is divided into two sections: an  interventional pain management section, as well as a completely separate and distinct medication management section. I explained that there are procedure days for interventional therapies, and evaluation days for follow-ups and medication management. Because of the amount of documentation required during both, they are kept separated. This means that there is the possibility that she may be scheduled for a procedure on one day, and medication management the next. I have also informed her that because of staffing and facility limitations, this practice will no longer take patients for medication management only. To illustrate the reasons for this, I gave the patient the example of surgeons, and how inappropriate it would be to refer a patient to her care, just to write for the post-surgical antibiotics on a surgery done by a different surgeon.   Because interventional pain management is part of the board-certified specialty for the doctors, the patient was informed that joining this practice means that they are open to any and all interventional therapies. I made it clear that this does not mean that they will be forced to have any procedures done. What this means is that I believe interventional therapies to be essential part of the diagnosis and proper management of chronic pain conditions. Therefore, patients not interested in these interventional alternatives will be better served under the care of a different practitioner.  The patient was also made aware of my Comprehensive Pain Management Safety Guidelines where by joining this practice, they limit all of their nerve blocks and joint injections to those done by our practice, for as long as we are retained to manage their care. Historic Controlled Substance Pharmacotherapy Review  PMP and historical list of controlled substances: Oxycodone/acetaminophen 7.5/325, diazepam 5 mg, oxycodone, known/acetaminophen 5/325 mg, hydrocodone/Chlorphen ER syrup,  tramadol 50 mg, Highest opioid analgesic regimen found: Oxycodone/acetaminophen 5/325 2 tablet every 4 hours oxycodone 60 mg per day Most recent opioid analgesic: Oxycodone/acetaminophen 7.5/325 mg one tablet 4 times daily oxycodone 26m per day Current opioid analgesics: Oxycodone/acetaminophen 7.5/325 mg one tablet 4 times daily oxycodone 475mper day Highest recorded MME/day: 90 mg/day MME/day: 67.5 mg/day Medications: The patient did not bring the medication(s) to the appointment, as requested in our "New Patient Package" Pharmacodynamics: Desired effects: Analgesia: The patient reports >50% benefit. Reported improvement in function: The patient reports medication allows her to accomplish basic ADLs. Clinically meaningful improvement in function (CMIF): Sustained CMIF goals met Perceived effectiveness: Described as relatively effective, allowing for increase in activities of daily living (ADL) Undesirable effects: Side-effects or Adverse reactions: None reported Historical Monitoring: The patient  reports that she does not use drugs. List of all UDS Test(s): No results found for: MDMA, COCAINSCRNUR, PCPSCRNUR, PCPQUANT, CANNABQUANT, THCU, ET[place]ist of all Serum Drug Screening Test(s):  No results found for: AMPHSCRSER, BARBSCRSER, BENZOSCRSER, COCAINSCRSER, PCPSCRSER, PCPQUANT, THCSCRSER, CANNABQUANT, OPIATESCRSER, OXYSCRSER, PROPOXSCRSER Historical Background Evaluation: [place] PDMP: Six (6) year initial data search conducted.             [place] Department of public safety, offender search: (PEditor, commissioningnformation) Non-contributory Risk Assessment Profile: Aberrant behavior: None observed or detected today Risk factors for fatal opioid overdose: None identified today Fatal overdose hazard ratio (HR): Calculation deferred Non-fatal overdose hazard ratio (HR): Calculation deferred Risk of opioid abuse or dependence: 0.7-3.0% with doses ? [place]  MME/day and 6.1-26% with doses ? 120 MME/day. Substance  use disorder (SUD) risk level: Pending results of Medical Psychology Evaluation for SUD Opioid risk tool (ORT) (Total Score): 4  ORT Scoring interpretation table:  Score <3 = Low Risk for SUD  Score between 4-7 = Moderate Risk for SUD  Score >8 = High Risk for Opioid Abuse   PHQ-2 Depression Scale:  Total score: 0  PHQ-2 Scoring interpretation table: (Score and probability of major depressive disorder)  Score 0 = No depression  Score 1 = 15.4% Probability  Score 2 = 21.1% Probability  Score 3 = 38.4% Probability  Score 4 = 45.5% Probability  Score 5 = 56.4% Probability  Score 6 = 78.6% Probability   PHQ-9 Depression Scale:  Total score: 0  PHQ-9 Scoring interpretation table:  Score 0-4 = No depression  Score 5-9 = Mild depression  Score 10-14 = Moderate depression  Score 15-19 = Moderately severe depression  Score 20-27 = Severe depression (2.4 times higher risk of SUD and 2.89 times higher risk of overuse)   Pharmacologic Plan: Pending ordered tests and/or consults  Meds  The patient has a current medication list which includes the following prescription(s): calcium carbonate, cyanocobalamin, omeprazole, and oxycodone-acetaminophen.  Current Outpatient Medications on File Prior to Visit  Medication Sig  . calcium carbonate (TUMS - DOSED IN MG ELEMENTAL CALCIUM) 500 MG chewable tablet Chew 1 tablet by mouth daily.  . Cyanocobalamin (VITAMIN B-12 PO) Take 2 tablets by mouth daily. GUMMIES  . omeprazole (PRILOSEC) 40 MG capsule TAKE 1 CAPSULE BY MOUTH EVERY DAY. (Patient taking differently: TAKE 1 CAPSULE BY MOUTH EVERY DAY-AM)  . oxyCODONE-acetaminophen (PERCOCET) 7.5-325 MG tablet Take 1 tablet every 4 (four) hours as needed by mouth for severe pain.   No current facility-administered medications on file prior to visit.    Imaging Review  Hip Imaging:  Hip-R DG 2-3 views:  Results for orders placed during the hospital encounter of 01/12/15  DG Hip Unilat With Pelvis 2-3  Views Right   Narrative CLINICAL DATA:  Fall, right upper leg pain  EXAM: DG HIP (WITH OR WITHOUT PELVIS) 2-3V RIGHT  COMPARISON:  None.  FINDINGS: Acute nondisplaced fractures noted of the right superior and inferior rami. No associated pelvic diastases. Bones are osteopenic. Right hip appears intact. Hips are located and symmetric.  Normal bowel gas pattern.  IMPRESSION: Acute nondisplaced right superior and inferior rami fractures.   Electronically Signed   By: Jerilynn Mages.  Shick M.D.   On: 01/12/2015 08:48    Note: Available results from prior imaging studies were reviewed.        ROS  Cardiovascular History: No reported cardiovascular signs or symptoms such as High blood pressure, coronary artery disease, abnormal heart rate or rhythm, heart attack, blood thinner therapy or heart weakness and/or failure Pulmonary or Respiratory History: No reported pulmonary signs or symptoms such as wheezing and difficulty taking a deep full breath (Asthma), difficulty blowing air out (Emphysema), coughing up mucus (Bronchitis), persistent dry cough, or temporary stoppage of breathing during sleep Neurological History: No reported neurological signs or symptoms such as seizures, abnormal skin sensations, urinary and/or fecal incontinence, being born with an abnormal open spine and/or a tethered spinal cord Review of Past Neurological Studies: No results found for this or any previous visit. Psychological-Psychiatric History: No reported psychological or psychiatric signs or symptoms such as difficulty sleeping, anxiety, depression, delusions or hallucinations (schizophrenial), mood swings (bipolar disorders) or suicidal ideations or attempts Gastrointestinal History:  Heartburn due to stomach pushing into lungs (Hiatal hernia) and Reflux or heatburn Genitourinary History: No reported renal or genitourinary signs or symptoms such as difficulty voiding or producing urine, peeing blood, non-functioning  kidney, kidney stones, difficulty emptying the bladder, difficulty controlling the flow of urine, or chronic kidney disease Hematological History: Brusing easily Endocrine History: No reported endocrine signs or symptoms such as high or low blood sugar, rapid heart rate due to high thyroid levels, obesity or weight gain due to slow thyroid or thyroid disease Rheumatologic History: Rheumatoid arthritis Musculoskeletal History: Negative for myasthenia gravis, muscular dystrophy, multiple sclerosis or malignant hyperthermia Work History: Working full time  Allergies  Ms. Wysocki has No Known Allergies.  Laboratory Chemistry  Inflammation Markers No results found for: CRP, ESRSEDRATE (CRP: Acute Phase) (ESR: Chronic Phase) Renal Function Markers Lab Results  Component Value Date   BUN 11 02/17/2017   CREATININE 0.84 03/03/2017   GFRAA >60 03/03/2017   GFRNONAA >60 03/03/2017   Hepatic Function Markers Lab Results  Component Value Date   AST 49 (H) 11/14/2016   ALT 27 11/14/2016   ALBUMIN 3.9 11/14/2016   ALKPHOS 76 11/14/2016   Electrolytes Lab Results  Component Value Date   NA 138 02/17/2017   K 4.5 02/17/2017   CL 102 02/17/2017   CALCIUM 9.4 02/17/2017   Neuropathy Markers Lab Results  Component Value Date   VITAMINB12 93 (L) 12/08/2014   Bone Pathology Markers Lab Results  Component Value Date   ALKPHOS 76 11/14/2016   CALCIUM 9.4 02/17/2017   Coagulation Parameters Lab Results  Component Value Date   PLT 181 03/03/2017   Cardiovascular Markers Lab Results  Component Value Date   HGB 14.5 03/03/2017   HCT 43.1 03/03/2017   Note: Lab results reviewed.  PFSH  Drug: Ms. Filkins  reports that she does not use drugs. Alcohol:  reports that she does not drink alcohol. Tobacco:  reports that she has quit smoking. she has never used smokeless tobacco. 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 Family: family history includes Bone cancer in her sister; Breast cancer (age of onset: 40) in her maternal aunt; Diabetes in her mother; Heart attack in her mother; Lung cancer in her sister.  Past Surgical History:  Procedure Laterality Date  . BREAST BIOPSY Right 1997   positive  . BREAST LUMPECTOMY Right 1997  . ESOPHAGUS SURGERY    . KNEE SURGERY Right   . ROBOTIC ADRENALECTOMY Right 12/28/2016   Procedure: ROBOTIC ADRENALECTOMY;  Surgeon: Hollice Espy, MD;  Location: [facility] ORS;  Service: Urology;  Laterality: Right;  . SKIN CANCER EXCISION    . THROAT SURGERY  1998   throat cancer   . TUBAL LIGATION    . VENTRAL HERNIA REPAIR N/A 03/03/2017   Procedure: HERNIA REPAIR VENTRAL ADULT;  Surgeon: Clayburn Pert, MD;  Location: [facility] ORS;  Service: General;  Laterality: N/A;   Active Ambulatory Problems    Diagnosis Date Noted  . Herpes simplex virus (HSV) epithelial keratitis 03/06/2013  . Nuclear sclerotic cataract 03/06/2013  . 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 Pelvic fracture ([place]) 01/12/2015  . Tobacco use 06/14/2007  . History of fundoplication 56/97/9480  . History of throat cancer 01/15/2015  . Acid reflux 01/15/2015  . Onychia of finger 10/23/2008  . Gonalgia 01/15/2015  .  Odontogenic tumor 03/13/2008  . Closed fracture of pubis ([place]) 01/15/2015  . Closed fracture of a rib 02/02/2015  . Fatigue 02/17/2016  . Other fatigue 02/17/2016  . Multiple lung nodules on CT 02/17/2016  . History of right breast cancer 02/17/2016  . Peripheral neuropathy 02/17/2016  . RIGHT Adrenal Mass 12/16/2016  . Right adrenal mass ([place]) 12/28/2016  . Incisional hernia, without obstruction or gangrene 02/13/2017  . S/P repair of ventral hernia 03/03/2017  . Chronic abdominal pain (Primary Area of Pain)  04/03/2017  . Chronic low back pain (Secondary Area of  Pain) (B) (R>L) 04/03/2017  . Chronic knee pain [facility] Area of Pain) (R) 04/03/2017  . Chronic pain syndrome 04/03/2017  . Disorder of bone, unspecified 04/03/2017  . Other specified health status 04/03/2017  . Other long term (current) drug therapy 04/03/2017  . Long term current use of opiate analgesic 04/03/2017  . Chronic sacroiliac joint pain 04/03/2017   Resolved Ambulatory Problems    Diagnosis Date Noted  . No Resolved Ambulatory Problems   Past Medical History:  Diagnosis Date  . Breast cancer ([place]) 1997  . 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[place]) 11/10/2014  . Cough 03/02/2017  . GERD (gastroesophageal reflux disease)   . History of hiatal hernia   . Skin cancer   . Throat cancer ([place]) 1998   Constitutional Exam  General appearance: Well nourished, well developed, and well hydrated. In no apparent acute distress Vitals:   04/03/17 1316  BP: 125/76  Pulse: 70  Resp: 14  Temp: 97.8 F (36.6 C)  TempSrc: Oral  SpO2: 99%  Weight: 130 lb (59 kg)  Height: 5' 1"  (1.549 m)   BMI Assessment: Estimated body mass index is 24.56 kg/m as calculated from the following:   Height as of this encounter: 5' 1"  (1.549 m).   Weight as of this encounter: 130 lb (59 kg).  BMI interpretation table: BMI level Category Range association with higher incidence of chronic pain  <18 kg/m2 Underweight   18.5-24.9 kg/m2 Ideal body weight   25-29.9 kg/m2 Overweight Increased incidence by 20%  30-34.9 kg/m2 Obese (Class I) Increased incidence by 68%  35-39.9 kg/m2 Severe obesity (Class II) Increased incidence by 136%  >40 kg/m2 Extreme obesity (Class III) Increased incidence by 254%   BMI Readings from Last 4 Encounters:  04/03/17 24.56 kg/m  03/20/17 24.75 kg/m  03/10/17 24.75 kg/m  03/10/17 24.94 kg/m   Wt Readings from Last 4 Encounters:  04/03/17 130 lb (59 kg)  03/20/17 131 lb (59.4 kg)  03/10/17 131 lb (59.4 kg)  03/10/17 132 lb (59.9 kg)  Psych/Mental status: Alert, oriented x 3  (person, place, & time)       Eyes: PERLA Respiratory: No evidence of acute respiratory distress   Abdomen: Well healed incision from previous surgery, Tender  Lumbar Spine Exam  Inspection: No masses, redness, or swelling Alignment: Symmetrical Functional ROM: Unrestricted ROM      Stability: No instability detected Muscle strength & Tone: Functionally intact Sensory: Unimpaired Palpation: No palpable anomalies       Provocative Tests: Lumbar Hyperextension and rotation test: evaluation deferred today       Patrick's Maneuver: Positive for bilateral S-I arthralgia              Gait & Posture Assessment  Ambulation: Unassisted Gait: Relatively normal for age and body habitus Posture: WNL   Lower Extremity Exam    Side: Right lower extremity  Side: Left lower extremity  Inspection:  No masses, redness, swelling, or asymmetry. No contractures  Inspection: No masses, redness, swelling, or asymmetry. No contractures  Functional ROM: Unrestricted ROM          Functional ROM: Unrestricted ROM          Muscle strength & Tone: Functionally intact  Muscle strength & Tone: Functionally intact  Sensory: Unimpaired  Sensory: Unimpaired  Palpation: No palpable anomalies  Palpation: No palpable anomalies   Assessment  Primary Diagnosis & Pertinent Problem List: Diagnoses of Chronic abdominal pain, Chronic bilateral low back pain without sciatica, Chronic pain of right knee, Chronic pain syndrome, Disorder of bone, unspecified, Other specified health status, Other long term (current) drug therapy, Long term current use of opiate analgesic, and Chronic sacroiliac joint pain were pertinent to this visit.  Visit Diagnosis: 1. Chronic abdominal pain   2. Chronic bilateral low back pain without sciatica   3. Chronic pain of right knee   4. Chronic pain syndrome   5. Disorder of bone, unspecified   6. Other specified health status   7. Other long term (current) drug therapy   8. Long term  current use of opiate analgesic   9. Chronic sacroiliac joint pain    Plan of Care  Initial treatment plan:  Please be advised that as per protocol, today's visit has been an evaluation only. We have not taken over the patient's controlled substance management.  Problem-specific plan: No problem-specific Assessment & Plan notes found for this encounter.  Ordered Lab-work, Procedure(s), Referral(s), & Consult(s): Orders Placed This Encounter  Procedures  . DG Lumbar Spine Complete W/Bend  . DG Si Joints  . DG Knee 1-2 Views Right  . Compliance Drug Analysis, Ur  . Comp. Metabolic Panel (12)  . Magnesium  . Vitamin B12  . Sedimentation rate  . 25-Hydroxyvitamin D Lcms D2+D3  . C-reactive protein   Pharmacotherapy: Medications ordered:  No orders of the defined types were placed in this encounter.  Medications administered during this visit: [name] had no medications administered during this visit.   Pharmacotherapy under consideration:  Opioid Analgesics: The patient was informed that there is no guarantee that she would be a candidate for opioid analgesics. The decision will be made following CDC guidelines. This decision will be based on the results of diagnostic studies, as well as Ms. Laker's risk profile.  Membrane stabilizer: To be determined at a later time Muscle relaxant: To be determined at a later time NSAID: To be determined at a later time Other analgesic(s): To be determined at a later time   Interventional therapies under consideration: Ms. Plair was informed that there is no guarantee that she would be a candidate for interventional therapies. The decision will be based on the results of diagnostic studies, as well as Ms. Monteleone's risk profile.  Possible procedure(s): Diagnostic celiac plexus block Diagnostic bilateral lumbar facet nerve block Diagnostic right sided sacroiliac joint injection Possible bilateral lumbar facet RFA Possible right sacroiliac  RFA Diagnostic right knee genicular nerve block Possible right knee genicular RFA    Provider-requested follow-up: Return for 2nd Visit, w/ Dr. Dossie Arbour, after [facility] eval.  Future Appointments  Date Time Provider [place]  04/03/2017  3:15 PM Clayburn Pert, MD [facility] None    Primary Care Physician: Margo Common, PA Location: [facility] Note by:  Date: 04/03/2017; Time: 2:01 PM  Pain Score Disclaimer: We use the NRS-11 scale. This is a self-reported, subjective measurement of pain severity with  only modest accuracy. It is used primarily to identify changes within a particular patient. It must be understood that outpatient pain scales are significantly less accurate that those used for research, where they can be applied under ideal controlled circumstances with minimal exposure to variables. In reality, the score is likely to be a combination of pain intensity and pain affect, where pain affect describes the degree of emotional arousal or changes in action readiness caused by the sensory experience of pain. Factors such as social and work situation, setting, emotional state, anxiety levels, expectation, and prior pain experience may influence pain perception and show large inter-individual differences that may also be affected by time variables.  Patient instructions provided during this appointment: Patient Instructions    ____________________________________________________________________________________________  Appointment Policy Summary  It is our goal and responsibility to provide the medical community with assistance in the evaluation and management of patients with chronic pain. Unfortunately our resources are limited. Because we do not have an unlimited amount of time, or available appointments, we are required to closely monitor and manage their use. The following rules exist to maximize their use:  Patient's  responsibilities: 1. Punctuality:  At what time should I arrive? You should be physically present in our office 30 minutes before your scheduled appointment. Your scheduled appointment is with your assigned healthcare provider. However, it takes 5-10 minutes to be "checked-in", and another 15 minutes for the nurses to do the admission. If you arrive to our office at the time you were given for your appointment, you will end up being at least 20-25 minutes late to your appointment with the provider. 2. Tardiness:  What happens if I arrive only a few minutes after my scheduled appointment time? You will need to reschedule your appointment. The cutoff is your appointment time. This is why it is so important that you arrive at least 30 minutes before that appointment. If you have an appointment scheduled for 10:00 AM and you arrive at 10:01, you will be required to reschedule your appointment.  3. Plan ahead:  Always assume that you will encounter traffic on your way in. Plan for it. If you are dependent on a driver, make sure they understand these rules and the need to arrive early. 4. Other appointments and responsibilities:  Avoid scheduling any other appointments before or after your pain clinic appointments.  5. Be prepared:  Write down everything that you need to discuss with your healthcare provider and give this information to the admitting nurse. Write down the medications that you will need refilled. Bring your pills and bottles (even the empty ones), to all of your appointments, except for those where a procedure is scheduled. 6. No children or pets:  Find someone to take care of them. It is not appropriate to bring them in. 7. Scheduling changes:  We request "advanced notification" of any changes or cancellations. 8. Advanced notification:  Defined as a time period of more than 24 hours prior to the originally scheduled appointment. This allows for the appointment to be offered to other  patients. 9. Rescheduling:  When a visit is rescheduled, it will require the cancellation of the original appointment. For this reason they both fall within the category of "Cancellations".  10. Cancellations:  They require advanced notification. Any cancellation less than 24 hours before the  appointment will be recorded as a "No Show". 11. No Show:  Defined as an unkept appointment where the patient failed to notify or declare to the practice their  intention or inability to keep the appointment.  Corrective process for repeat offenders:  1. Tardiness: Three (3) episodes of rescheduling due to late arrivals will be recorded as one (1) "No Show". 2. Cancellation or reschedule: Three (3) cancellations or rescheduling will be recorded as one (1) "No Show". 3. "No Shows": Three (3) "No Shows" within a 12 month period will result in discharge from the practice.  ____________________________________________________________________________________________   ____________________________________________________________________________________________  Pain Scale  Introduction: The pain score used by this practice is the Verbal Numerical Rating Scale (VNRS-11). This is an 11-point scale. It is for adults and children 10 years or older. There are significant differences in how the pain score is reported, used, and applied. Forget everything you learned in the past and learn this scoring system.  General Information: The scale should reflect your current level of pain. Unless you are specifically asked for the level of your worst pain, or your average pain. If you are asked for one of these two, then it should be understood that it is over the past 24 hours.  Basic Activities of Daily Living (ADL): Personal hygiene, dressing, eating, transferring, and using restroom.  Instructions: Most patients tend to report their level of pain as a combination of two factors, their physical pain and their  psychosocial pain. This last one is also known as "suffering" and it is reflection of how physical pain affects you socially and psychologically. From now on, report them separately. From this point on, when asked to report your pain level, report only your physical pain. Use the following table for reference.  Pain Clinic Pain Levels (0-5/10)  Pain Level Score  Description  No Pain 0   Mild pain 1 Nagging, annoying, but does not interfere with basic activities of daily living (ADL). Patients are able to eat, bathe, get dressed, toileting (being able to get on and off the toilet and perform personal hygiene functions), transfer (move in and out of bed or a chair without assistance), and maintain continence (able to control bladder and bowel functions). Blood pressure and heart rate are unaffected. A normal heart rate for a healthy adult ranges from 60 to 100 bpm (beats per minute).   Mild to moderate pain 2 Noticeable and distracting. Impossible to hide from other people. More frequent flare-ups. Still possible to adapt and function close to normal. It can be very annoying and may have occasional stronger flare-ups. With discipline, patients may get used to it and adapt.   Moderate pain 3 Interferes significantly with activities of daily living (ADL). It becomes difficult to feed, bathe, get dressed, get on and off the toilet or to perform personal hygiene functions. Difficult to get in and out of bed or a chair without assistance. Very distracting. With effort, it can be ignored when deeply involved in activities.   Moderately severe pain 4 Impossible to ignore for more than a few minutes. With effort, patients may still be able to manage work or participate in some social activities. Very difficult to concentrate. Signs of autonomic nervous system discharge are evident: dilated pupils (mydriasis); mild sweating (diaphoresis); sleep interference. Heart rate becomes elevated (>115 bpm). Diastolic blood  pressure (lower number) rises above 100 mmHg. Patients find relief in laying down and not moving.   Severe pain 5 Intense and extremely unpleasant. Associated with frowning face and frequent crying. Pain overwhelms the senses.  Ability to do any activity or maintain social relationships becomes significantly limited. Conversation becomes difficult. Pacing back and forth  is common, as getting into a comfortable position is nearly impossible. Pain wakes you up from deep sleep. Physical signs will be obvious: pupillary dilation; increased sweating; goosebumps; brisk reflexes; cold, clammy hands and feet; nausea, vomiting or dry heaves; loss of appetite; significant sleep disturbance with inability to fall asleep or to remain asleep. When persistent, significant weight loss is observed due to the complete loss of appetite and sleep deprivation.  Blood pressure and heart rate becomes significantly elevated. Caution: If elevated blood pressure triggers a pounding headache associated with blurred vision, then the patient should immediately seek attention at an urgent or emergency care unit, as these may be signs of an impending stroke.    Emergency Department Pain Levels (6-10/10)  Emergency Room Pain 6 Severely limiting. Requires emergency care and should not be seen or managed at an outpatient pain management facility. Communication becomes difficult and requires great effort. Assistance to reach the emergency department may be required. Facial flushing and profuse sweating along with potentially dangerous increases in heart rate and blood pressure will be evident.   Distressing pain 7 Self-care is very difficult. Assistance is required to transport, or use restroom. Assistance to reach the emergency department will be required. Tasks requiring coordination, such as bathing and getting dressed become very difficult.   Disabling pain 8 Self-care is no longer possible. At this level, pain is disabling. The  individual is unable to do even the most "basic" activities such as walking, eating, bathing, dressing, transferring to a bed, or toileting. Fine motor skills are lost. It is difficult to think clearly.   Incapacitating pain 9 Pain becomes incapacitating. Thought processing is no longer possible. Difficult to remember your own name. Control of movement and coordination are lost.   The worst pain imaginable 10 At this level, most patients pass out from pain. When this level is reached, collapse of the autonomic nervous system occurs, leading to a sudden drop in blood pressure and heart rate. This in turn results in a temporary and dramatic drop in blood flow to the brain, leading to a loss of consciousness. Fainting is one of the body's self defense mechanisms. Passing out puts the brain in a calmed state and causes it to shut down for a while, in order to begin the healing process.    Summary: 1. Refer to this scale when providing [place] with your pain level. 2. Be accurate and careful when reporting your pain level. This will help with your care. 3. Over-reporting your pain level will lead to loss of credibility. 4. Even a level of 1/10 means that there is pain and will be treated at our facility. 5. High, inaccurate reporting will be documented as "Symptom Exaggeration", leading to loss of credibility and suspicions of possible secondary gains such as obtaining more narcotics, or wanting to appear disabled, for fraudulent reasons. 6. Only pain levels of 5 or below will be seen at our facility. 7. Pain levels of 6 and above will be sent to the Emergency Department and the appointment cancelled. ____________________________________________________________________________________________

## 2017-04-03 NOTE — Progress Notes (Signed)
Outpatient Surgical Follow Up  04/03/2017  Denise Watts is an 61 y.o. female.   Chief Complaint  Patient presents with  . Routine Post Op    Open incisional hernia repair- 11/2 -Dr. Adonis Huguenin    HPI: 61 year old female returns to clinic for follow-up now 1 month status post open incisional hernia repair.  Patient reports that her abdominal pain is much improved.  She continues to require narcotic pain medications.  She was seen by the pain management clinic and was dissatisfied with the recommendations as she does not wish to undergo psychiatric evaluations at this time.  Currently she is eating well denies any fevers, chills, nausea, vomiting, chest pain, shortness of breath, diarrhea, constipation.  Past Medical History:  Diagnosis Date  . Breast cancer [facility]) 1997   right breast lumpectomy with radiation  . 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 Cough 03/02/2017   INTERMTTENT DRY COUGH  . GERD (gastroesophageal reflux disease)   . History of hiatal hernia   . Skin cancer   . Throat cancer ([place]) 1998    Past Surgical History:  Procedure Laterality Date  . BREAST BIOPSY Right 1997   positive  . BREAST LUMPECTOMY Right 1997  . ESOPHAGUS SURGERY    . KNEE SURGERY Right   . ROBOTIC ADRENALECTOMY Right 12/28/2016   Procedure: ROBOTIC ADRENALECTOMY;  Surgeon: Hollice Espy, MD;  Location: [facility] ORS;  Service: Urology;  Laterality: Right;  . SKIN CANCER EXCISION    . THROAT SURGERY  1998   throat cancer   . TUBAL LIGATION    . VENTRAL HERNIA REPAIR N/A 03/03/2017   Procedure: HERNIA REPAIR VENTRAL ADULT;  Surgeon: Clayburn Pert, MD;  Location: [facility] ORS;  Service: General;  Laterality: N/A;    Family History  Problem Relation Age of Onset  . Diabetes Mother   . Heart attack Mother   . Lung cancer Sister   . Bone cancer Sister   . Breast cancer Maternal Aunt 41  . Prostate cancer Neg Hx   . Kidney cancer Neg Hx   . Bladder Cancer Neg Hx     Social History:  reports  that she has quit smoking. she has never used smokeless tobacco. She reports that she does not drink alcohol or use drugs.  Allergies: No Known Allergies  Medications reviewed.    ROS  A multipoint review of systems was completed.  All pertinent positives and negatives are documented within the HPI and the remainder are negative.  BP 139/84   Pulse 72   Temp 97.9 F (36.6 C) (Oral)   Ht 5\' 1"  (1.549 m)   Wt 58.3 kg (128 lb 9.6 oz)   BMI 24.30 kg/m   Physical Exam General: No acute distress Chest: Clear to auscultation Heart: Regular rate and rhythm Abdomen: Soft, nondistended, minimally tender to deep palpation at the incision site.    No results found for this or any previous visit (from the past 48 hour(s)). No results found.  Assessment/Plan:  1. Aftercare following surgery 61 year old female 1 month status post open incisional hernia repair.  Having significant pain issues postoperatively but they appear to be resolving.  Discussed transitioning to tramadol from the narcotics.  Patient voiced willing to try.  She will alternate the tramadol with the narcotics.  She understands that if she continues to the narcotics that I would recommend her going through the previously set up workup from the pain clinic.  She voiced understanding and agrees.  She will  call for follow-up as needed in the next 2-3 weeks.     Clayburn Pert, MD FACS General Surgeon  04/03/2017,5:23 PM

## 2017-04-03 NOTE — Progress Notes (Signed)
Safety precautions to be maintained throughout the outpatient stay will include: orient to surroundings, keep bed in low position, maintain call bell within reach at all times, provide assistance with transfer out of bed and ambulation.  

## 2017-04-04 ENCOUNTER — Ambulatory Visit: Payer: PRIVATE HEALTH INSURANCE | Admitting: Nurse Practitioner

## 2017-04-07 LAB — 25-HYDROXY VITAMIN D LCMS D2+D3
25-Hydroxy, Vitamin D-2: <1 ng/mL
25-Hydroxy, Vitamin D-3: 34 ng/mL
25-Hydroxy, Vitamin D: 34 ng/mL

## 2017-04-07 LAB — MAGNESIUM: MAGNESIUM: 2.2 mg/dL (ref 1.6–2.3)

## 2017-04-07 LAB — COMP. METABOLIC PANEL (12)
ALBUMIN: 4.4 g/dL (ref 3.6–4.8)
AST: 23 IU/L (ref 0–40)
Albumin/Globulin Ratio: 1.9 (ref 1.2–2.2)
Alkaline Phosphatase: 76 IU/L (ref 39–117)
BUN / CREAT RATIO: 12 (ref 12–28)
BUN: 11 mg/dL (ref 8–27)
Bilirubin Total: 0.2 mg/dL (ref 0.0–1.2)
CALCIUM: 9.3 mg/dL (ref 8.7–10.3)
CREATININE: 0.89 mg/dL (ref 0.57–1.00)
Chloride: 101 mmol/L (ref 96–106)
GFR calc Af Amer: 80 mL/min/{1.73_m2} (ref >59)
GFR, EST NON AFRICAN AMERICAN: 69 mL/min/{1.73_m2} (ref >59)
GLUCOSE: 88 mg/dL (ref 65–99)
Globulin, Total: 2.3 g/dL (ref 1.5–4.5)
Potassium: 4 mmol/L (ref 3.5–5.2)
SODIUM: 140 mmol/L (ref 134–144)
TOTAL PROTEIN: 6.7 g/dL (ref 6.0–8.5)

## 2017-04-07 LAB — VITAMIN B12: VITAMIN B 12: 448 pg/mL (ref 232–1245)

## 2017-04-07 LAB — SEDIMENTATION RATE: SED RATE: 6 mm/h (ref 0–40)

## 2017-04-07 LAB — C-REACTIVE PROTEIN: CRP: 25.5 mg/L — ABNORMAL HIGH (ref 0.0–4.9)

## 2017-04-08 LAB — COMPLIANCE DRUG ANALYSIS, UR

## 2017-04-18 ENCOUNTER — Telehealth: Payer: Self-pay

## 2017-04-18 NOTE — Telephone Encounter (Signed)
FMLA paperwork completed and Patient will pick up tomorrow.  Patient has additional information that she needs to complete before submitting to employer.

## 2017-04-26 ENCOUNTER — Encounter: Payer: Self-pay | Admitting: Family Medicine

## 2017-04-26 ENCOUNTER — Ambulatory Visit (INDEPENDENT_AMBULATORY_CARE_PROVIDER_SITE_OTHER): Payer: PRIVATE HEALTH INSURANCE | Admitting: Family Medicine

## 2017-04-26 VITALS — BP 124/72 | HR 88 | Resp 24 | Wt 124.0 lb

## 2017-04-26 DIAGNOSIS — J069 Acute upper respiratory infection, unspecified: Secondary | ICD-10-CM

## 2017-04-26 DIAGNOSIS — R300 Dysuria: Secondary | ICD-10-CM

## 2017-04-26 MED ORDER — AMOXICILLIN 500 MG PO CAPS: 1000.0000 mg | ORAL_CAPSULE | Freq: Three times a day (TID) | ORAL | 0 refills | Status: AC

## 2017-04-26 NOTE — Progress Notes (Signed)
Patient: Denise Watts Female    DOB: [birth year]   61 y.o.   MRN: [id number] Visit Date: 04/26/2017  Today's Provider: Lelon Huh, MD   Chief Complaint  Patient presents with  . Cough  . Urinary Tract Infection   Subjective:    HPI Patient comes in today c/o cough X 3 weeks. She reports that it is a productive cough that seems to be getting worse. She has tried OTC cough medication with no relief.  Has felt short of breath., is worse when she lays down. Had fever a few weeks ago, but none since.   She also c/o a possible UTI X 2 weeks. She has been taking AZO with no relief. She has also noticed some blood in her urine. Having lower abdominal and suprapubic pain. A little nauseated, but no vomiting.     No Known Allergies   Current Outpatient Medications:  .  calcium carbonate (TUMS - DOSED IN MG ELEMENTAL CALCIUM) 500 MG chewable tablet, Chew 1 tablet by mouth daily., Disp: , Rfl:  .  Cyanocobalamin (VITAMIN B-12 PO), Take 2 tablets by mouth daily. GUMMIES, Disp: , Rfl:  .  omeprazole (PRILOSEC) 40 MG capsule, TAKE 1 CAPSULE BY MOUTH EVERY DAY. (Patient taking differently: TAKE 1 CAPSULE BY MOUTH EVERY DAY-AM), Disp: 30 capsule, Rfl: 11 .  oxyCODONE-acetaminophen (PERCOCET) 7.5-325 MG tablet, Take 1 tablet every 4 (four) hours as needed by mouth for severe pain., Disp: 30 tablet, Rfl: 0 .  traMADol (ULTRAM) 50 MG tablet, Take 1 tablet (50 mg total) by mouth every 6 (six) hours as needed., Disp: 60 tablet, Rfl: 0 .  amoxicillin (AMOXIL) 500 MG capsule, Take 2 capsules (1,000 mg total) by mouth 3 (three) times daily for 5 days., Disp: 30 capsule, Rfl: 0 .  oxyCODONE-acetaminophen (PERCOCET) 7.5-325 MG tablet, Take 1 tablet by mouth every 4 (four) hours as needed for severe pain., Disp: 20 tablet, Rfl: 0  Review of Systems  Constitutional: Positive for activity change, chills, fatigue and fever.  HENT: Positive for postnasal drip, rhinorrhea, sinus pressure, sneezing and  sore throat.   Respiratory: Positive for cough and wheezing.   Genitourinary: Positive for dysuria, flank pain, frequency and hematuria.  Musculoskeletal: Positive for arthralgias.  Skin: Negative.   Neurological: Positive for headaches.    Social History   Tobacco Use  . Smoking status: Former Research scientist (life sciences)  . Smokeless tobacco: Never Used  Substance Use Topics  . Alcohol use: No    Alcohol/week: 0.0 oz   Objective:   BP 124/72   Pulse 88   Resp (!) 24   Wt 124 lb (56.2 kg)   SpO2 95%   BMI 23.43 kg/m  Vitals:   04/26/17 1456  BP: 124/72  Pulse: 88  Resp: (!) 24  SpO2: 95%  Weight: 124 lb (56.2 kg)     Physical Exam  General Appearance:    Alert, cooperative, no distress  Eyes:    PERRL, conjunctiva/corneas clear, EOM's intact       Lungs:     Clear to auscultation bilaterally, respirations unlabored  Heart:    Regular rate and rhythm  Abdomen:   bowel sounds present and normal in all 4 quadrants, soft, round. Mild suprapubic tenderness, no rebound, no guarding. Marland Kitchen No CVA tenderness         Assessment & Plan:     1. Upper respiratory tract infection, unspecified type Consider chest XR for cough if not improving on  amoxicillin prescribed below.   2. Dysuria Dipstick unreadable due to discoloration with Azo. Cover with amoxicillin while awaiting culture results.  - POCT urinalysis dipstick - Urine Culture       Lelon Huh, MD  [facility]

## 2017-04-28 LAB — URINE CULTURE
MICRO NUMBER:: 81449407
SPECIMEN QUALITY: ADEQUATE

## 2017-05-03 ENCOUNTER — Telehealth: Payer: Self-pay

## 2017-05-03 NOTE — Telephone Encounter (Signed)
Left message to call back  

## 2017-05-03 NOTE — Telephone Encounter (Signed)
-----   Message from Birdie Sons, MD sent at 04/28/2017  2:38 PM EST ----- Urine culture shows infection sensitive to antibiotic that was prescribed. Symptoms should completely resolve by the time antibiotic is finished. Call back otherwise.

## 2017-05-03 NOTE — Telephone Encounter (Signed)
Advised patient as below.  

## 2017-09-26 ENCOUNTER — Other Ambulatory Visit: Payer: Self-pay | Admitting: Family Medicine

## 2017-09-26 NOTE — Telephone Encounter (Signed)
thanks

## 2017-10-26 ENCOUNTER — Other Ambulatory Visit: Payer: Self-pay | Admitting: Oncology

## 2017-10-26 ENCOUNTER — Telehealth: Payer: Self-pay | Admitting: *Deleted

## 2017-10-26 ENCOUNTER — Other Ambulatory Visit: Payer: Self-pay | Admitting: *Deleted

## 2017-10-26 DIAGNOSIS — R911 Solitary pulmonary nodule: Secondary | ICD-10-CM

## 2017-10-26 DIAGNOSIS — Z853 Personal history of malignant neoplasm of breast: Secondary | ICD-10-CM

## 2017-10-26 DIAGNOSIS — C7490 Malignant neoplasm of unspecified part of unspecified adrenal gland: Secondary | ICD-10-CM

## 2017-10-26 NOTE — Telephone Encounter (Signed)
Called patient about her mammogram. Dr. Janese Banks did order it and the earliest they have is July 12 at 11:20 arrival at [facility]. She is agreeable to the plan. I also told her that when she had her last ct scan we wanted to see her afterwards but it looks like it was never made. I apologized for the error.  Dr. Janese Banks wants her to have another CT scan and see md with labs the next day or two. Patient is going out of town to [place] and will be glad to do the above as long as it is after 7/7.  We have made her ct 7/8  At 10 am and she will pick up prep kit 6/28.  She will see me 7/9 9:15 for labs and see md after. All info put in mail for pt. And she is agreeable to above plan.

## 2017-10-31 ENCOUNTER — Ambulatory Visit: Payer: PRIVATE HEALTH INSURANCE

## 2017-11-03 ENCOUNTER — Ambulatory Visit: Payer: PRIVATE HEALTH INSURANCE | Admitting: Oncology

## 2017-11-03 ENCOUNTER — Other Ambulatory Visit: Payer: PRIVATE HEALTH INSURANCE

## 2017-11-06 ENCOUNTER — Ambulatory Visit
Admission: RE | Admit: 2017-11-06 | Discharge: 2017-11-06 | Disposition: A | Discharge status: Home / Self Care | Payer: PRIVATE HEALTH INSURANCE | Source: Ambulatory Visit | Attending: Oncology | Admitting: Oncology

## 2017-11-06 DIAGNOSIS — C7490 Malignant neoplasm of unspecified part of unspecified adrenal gland: Secondary | ICD-10-CM | POA: Diagnosis not present

## 2017-11-06 DIAGNOSIS — N649 Disorder of breast, unspecified: Secondary | ICD-10-CM | POA: Diagnosis not present

## 2017-11-06 DIAGNOSIS — R918 Other nonspecific abnormal finding of lung field: Secondary | ICD-10-CM | POA: Diagnosis not present

## 2017-11-06 DIAGNOSIS — E896 Postprocedural adrenocortical (-medullary) hypofunction: Secondary | ICD-10-CM | POA: Insufficient documentation

## 2017-11-06 DIAGNOSIS — R911 Solitary pulmonary nodule: Secondary | ICD-10-CM

## 2017-11-06 LAB — POCT I-STAT CREATININE: Creatinine, Ser: 0.8 mg/dL (ref 0.44–1.00)

## 2017-11-06 MED ORDER — IOPAMIDOL (ISOVUE-300) INJECTION 61%
100.0000 mL | Freq: Once | INTRAVENOUS | Status: AC | PRN
  Administered 2017-11-06: 100 mL via INTRAVENOUS

## 2017-11-07 ENCOUNTER — Inpatient Hospital Stay: Payer: PRIVATE HEALTH INSURANCE | Attending: Oncology

## 2017-11-07 ENCOUNTER — Inpatient Hospital Stay (HOSPITAL_BASED_OUTPATIENT_CLINIC_OR_DEPARTMENT_OTHER): Payer: PRIVATE HEALTH INSURANCE | Admitting: Oncology

## 2017-11-07 ENCOUNTER — Other Ambulatory Visit: Payer: Self-pay

## 2017-11-07 ENCOUNTER — Encounter: Payer: Self-pay | Admitting: Oncology

## 2017-11-07 VITALS — BP 137/85 | HR 66 | Temp 98.2°F | Resp 12 | Ht 61.0 in | Wt 136.4 lb

## 2017-11-07 DIAGNOSIS — I700 Atherosclerosis of aorta: Secondary | ICD-10-CM

## 2017-11-07 DIAGNOSIS — Z801 Family history of malignant neoplasm of trachea, bronchus and lung: Secondary | ICD-10-CM | POA: Insufficient documentation

## 2017-11-07 DIAGNOSIS — Z79899 Other long term (current) drug therapy: Secondary | ICD-10-CM | POA: Diagnosis not present

## 2017-11-07 DIAGNOSIS — C7490 Malignant neoplasm of unspecified part of unspecified adrenal gland: Secondary | ICD-10-CM | POA: Insufficient documentation

## 2017-11-07 DIAGNOSIS — I2510 Atherosclerotic heart disease of native coronary artery without angina pectoris: Secondary | ICD-10-CM

## 2017-11-07 DIAGNOSIS — R911 Solitary pulmonary nodule: Secondary | ICD-10-CM

## 2017-11-07 DIAGNOSIS — K760 Fatty (change of) liver, not elsewhere classified: Secondary | ICD-10-CM

## 2017-11-07 DIAGNOSIS — Z85828 Personal history of other malignant neoplasm of skin: Secondary | ICD-10-CM

## 2017-11-07 DIAGNOSIS — Z808 Family history of malignant neoplasm of other organs or systems: Secondary | ICD-10-CM

## 2017-11-07 DIAGNOSIS — R109 Unspecified abdominal pain: Secondary | ICD-10-CM | POA: Diagnosis not present

## 2017-11-07 DIAGNOSIS — Z803 Family history of malignant neoplasm of breast: Secondary | ICD-10-CM

## 2017-11-07 DIAGNOSIS — K219 Gastro-esophageal reflux disease without esophagitis: Secondary | ICD-10-CM

## 2017-11-07 DIAGNOSIS — R918 Other nonspecific abnormal finding of lung field: Secondary | ICD-10-CM

## 2017-11-07 DIAGNOSIS — Z923 Personal history of irradiation: Secondary | ICD-10-CM | POA: Diagnosis not present

## 2017-11-07 DIAGNOSIS — Z853 Personal history of malignant neoplasm of breast: Secondary | ICD-10-CM | POA: Insufficient documentation

## 2017-11-07 DIAGNOSIS — Z8521 Personal history of malignant neoplasm of larynx: Secondary | ICD-10-CM | POA: Diagnosis not present

## 2017-11-07 DIAGNOSIS — Z87891 Personal history of nicotine dependence: Secondary | ICD-10-CM | POA: Insufficient documentation

## 2017-11-07 LAB — CBC WITH DIFFERENTIAL/PLATELET
BASOS ABS: 0 10*3/uL (ref 0–0.1)
BASOS PCT: 1 %
Eosinophils Absolute: 0.2 10*3/uL (ref 0–0.7)
Eosinophils Relative: 4 %
HCT: 40.9 % (ref 35.0–47.0)
Hemoglobin: 14 g/dL (ref 12.0–16.0)
LYMPHS PCT: 30 %
Lymphs Abs: 1.4 10*3/uL (ref 1.0–3.6)
MCH: 33.4 pg (ref 26.0–34.0)
MCHC: 34.3 g/dL (ref 32.0–36.0)
MCV: 97.6 fL (ref 80.0–100.0)
Monocytes Absolute: 0.4 10*3/uL (ref 0.2–0.9)
Monocytes Relative: 10 %
NEUTROS ABS: 2.5 10*3/uL (ref 1.4–6.5)
Neutrophils Relative %: 55 %
Platelets: 194 10*3/uL (ref 150–440)
RBC: 4.19 MIL/uL (ref 3.80–5.20)
RDW: 12.8 % (ref 11.5–14.5)
WBC: 4.5 10*3/uL (ref 3.6–11.0)

## 2017-11-07 LAB — COMPREHENSIVE METABOLIC PANEL
ALBUMIN: 4 g/dL (ref 3.5–5.0)
ALT: 15 U/L (ref 0–44)
AST: 23 U/L (ref 15–41)
Alkaline Phosphatase: 60 U/L (ref 38–126)
Anion gap: 9 (ref 5–15)
BUN: 12 mg/dL (ref 8–23)
CO2: 25 mmol/L (ref 22–32)
CREATININE: 0.73 mg/dL (ref 0.44–1.00)
Calcium: 9 mg/dL (ref 8.9–10.3)
Chloride: 105 mmol/L (ref 98–111)
GFR calc Af Amer: >60 mL/min (ref >60)
GLUCOSE: 96 mg/dL (ref 70–99)
Potassium: 4.2 mmol/L (ref 3.5–5.1)
Sodium: 139 mmol/L (ref 135–145)
Total Bilirubin: 0.6 mg/dL (ref 0.3–1.2)
Total Protein: 6.6 g/dL (ref 6.5–8.1)

## 2017-11-07 NOTE — Progress Notes (Signed)
Patient here for results. She reports feeling a knot in her right upper abdomen.

## 2017-11-07 NOTE — Progress Notes (Signed)
Hematology/Oncology Consult note [facility]  Telephone:([phone] [phone]  Patient Care Team: Chrismon, Vickki Muff, PA as PCP - General (Physician Assistant)   Name of the patient: Denise Watts  [id number]  [birth year]   Date of visit: 11/07/17  Diagnosis- 1. H/o breast cancer in 1997 2. H/o stage I SCC of vocal cord in 2015. 3. Lung nodules4. Adrenal carcinoma of unknown primary s/p resection  Chief complaint/ Reason for visit- discuss ct scan results  Heme/Onc history:  Oncology History   1. Carcinoma of breast, T1, N0, M0 tumor diagnosis.  In January of 1997 2. Carcinoma of the vocal cord T1, N0, M0 tumor.  Status postradiation therapy 3. Abnormal CT scan of the chest (December, 2015) 4. Repeat CT scan of chest shows stable nodule (July, 2016) 5. Neutropenia with normal hemoglobin and normal platelet count (July, 2016)     Cancer of vocal cord [facility])   11/10/2014 Initial Diagnosis    Cancer of vocal cord      The patient was originally diagnosed with right breast cancer in 1997, status post surgery and radiation treatment only. She never received chemotherapy or hormone therapy. For her vocal cord cancer, she had extensive surgery and radiation therapy in 1998. Due to past history of smoking, she was also noted to have lung nodules. She is undergoing active surveillance imaging study of the chest.  Patient noted to have adrenal mass on CT chest which prompted PET CT in July 2018 which showed: IMPRESSION: 1. Right adrenal mass with peripheral hypermetabolism. Given interval development since 11/18/2015, suspicious for either isolated metastasis or a primary adrenal neoplasm. This should be considered for biopsy. 2. No evidence of hypermetabolic primary malignancy or extraadrenal metastasis. Pulmonary nodules are not significantly hypermetabolic. 3. Coronary artery atherosclerosis. Aortic Atherosclerosis (ICD10-I70.0). 4. Hepatic  steatosis. 5. Left sixth rib hypermetabolism is favored to be related to remote Trauma.  Patient underwent right adrenalectomy which showed: DIAGNOSIS:  A. ADRENAL GLAND, RIGHT; ADRENALECTOMY:  - HIGH GRADE CARCINOMA WITH EXTENSIVE NECROSIS.  - MARGINS OF THE SPECIMEN ARE NEGATIVE FOR CARCINOMA.   Comment:  Sections demonstrate abundant necrosis confined to the adrenal medullary  compartment. In one block of tissue a small fragment of viable malignant  neoplasm was identified. A panel of immunohistochemical stains was  performed with the following pattern of immunoreactivity:  Super pancytokeratin: Positive  GATA-3: Negative  TTF-1: Negative  P40: Negative  Napsin: Negative  PAX-8: Negative  Chromogranin: Negative    Interval history-overall she feels well.  Her appetite is good and she has denies any unintentional weight loss.  She did undergo incisional hernia surgery in October 2018 and since then she has been having some pain in her abdomen around the surgical site.  Denies other complaints  ECOG PS- 0 Pain scale- 0   Review of systems- Review of Systems  Constitutional: Negative for chills, fever, malaise/fatigue and weight loss.  HENT: Negative for congestion, ear discharge and nosebleeds.   Eyes: Negative for blurred vision.  Respiratory: Negative for cough, hemoptysis, sputum production, shortness of breath and wheezing.   Cardiovascular: Negative for chest pain, palpitations, orthopnea and claudication.  Gastrointestinal: Positive for abdominal pain. Negative for blood in stool, constipation, diarrhea, heartburn, melena, nausea and vomiting.  Genitourinary: Negative for dysuria, flank pain, frequency, hematuria and urgency.  Musculoskeletal: Negative for back pain, joint pain and myalgias.  Skin: Negative for rash.  Neurological: Negative for dizziness, tingling, focal weakness, seizures, weakness and headaches.  Endo/Heme/Allergies: Does not bruise/bleed easily.    Psychiatric/Behavioral: Negative for depression and suicidal ideas. The patient does not have insomnia.       No Known Allergies   Past Medical History:  Diagnosis Date  . Breast cancer [facility]) 1997   right breast lumpectomy with radiation  . 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[place]) 11/10/2014  . Cough 03/02/2017   INTERMTTENT DRY COUGH  . GERD (gastroesophageal reflux disease)   . History of hiatal hernia   . Skin cancer   . Throat cancer ([facility]) 1998     Past Surgical History:  Procedure Laterality Date  . BREAST BIOPSY Right 1997   positive  . BREAST LUMPECTOMY Right 1997  . ESOPHAGUS SURGERY    . KNEE SURGERY Right   . ROBOTIC ADRENALECTOMY Right 12/28/2016   Procedure: ROBOTIC ADRENALECTOMY;  Surgeon: Hollice Espy, MD;  Location: [facility] ORS;  Service: Urology;  Laterality: Right;  . SKIN CANCER EXCISION    . THROAT SURGERY  1998   throat cancer   . TUBAL LIGATION    . VENTRAL HERNIA REPAIR N/A 03/03/2017   Procedure: HERNIA REPAIR VENTRAL ADULT;  Surgeon: Clayburn Pert, MD;  Location: [facility] ORS;  Service: General;  Laterality: N/A;    Social History   Socioeconomic History  . Marital status: Married    Spouse name: Not on file  . Number of children: Not on file  . Years of education: Not on file  . Highest education level: Not on file  Occupational History  . Not on file  Social Needs  . Financial resource strain: Not on file  . Food insecurity:    Worry: Not on file    Inability: Not on file  . Transportation needs:    Medical: Not on file    Non-medical: Not on file  Tobacco Use  . Smoking status: Former Research scientist (life sciences)  . Smokeless tobacco: Never Used  Substance and Sexual Activity  . Alcohol use: No    Alcohol/week: 0.0 oz  . Drug use: No  . Sexual activity: Not on file  Lifestyle  . Physical activity:    Days per week: Not on file    Minutes per session: Not on file  . Stress: Not on file  Relationships  . Social connections:    Talks on phone: Not on file     Gets together: Not on file    Attends religious service: Not on file    Active member of club or organization: Not on file    Attends meetings of clubs or organizations: Not on file    Relationship status: Not on file  . Intimate partner violence:    Fear of current or ex partner: Not on file    Emotionally abused: Not on file    Physically abused: Not on file    Forced sexual activity: Not on file  Other Topics Concern  . Not on file  Social History Narrative  . Not on file    Family History  Problem Relation Age of Onset  . Diabetes Mother   . Heart attack Mother   . Lung cancer Sister   . Bone cancer Sister   . Breast cancer Maternal Aunt 41  . Prostate cancer Neg Hx   . Kidney cancer Neg Hx   . Bladder Cancer Neg Hx      Current Outpatient Medications:  .  omeprazole (PRILOSEC) 40 MG capsule, TAKE 1 CAPSULE BY MOUTH EVERY DAY-AM, Disp: 30 capsule, Rfl: 11  Physical  exam:  Vitals:   11/07/17 0935 11/07/17 0940  BP:  137/85  Pulse:  66  Resp: 12   Temp:  98.2 F (36.8 C)  TempSrc:  Tympanic  Weight: 136 lb 6.4 oz (61.9 kg)   Height: 5\' 1"  (1.549 m)    Physical Exam  Constitutional: She is oriented to person, place, and time. She appears well-developed and well-nourished.  HENT:  Head: Normocephalic and atraumatic.  Eyes: Pupils are equal, round, and reactive to light. EOM are normal.  Neck: Normal range of motion.  Cardiovascular: Normal rate, regular rhythm and normal heart sounds.  Pulmonary/Chest: Effort normal and breath sounds normal.  Abdominal: Soft. Bowel sounds are normal.  Neurological: She is alert and oriented to person, place, and time.  Skin: Skin is warm and dry.     CMP Latest Ref Rng & Units 11/07/2017  Glucose 70 - 99 mg/dL 96  BUN 8 - 23 mg/dL 12  Creatinine 0.44 - 1.00 mg/dL 0.73  Sodium 135 - 145 mmol/L 139  Potassium 3.5 - 5.1 mmol/L 4.2  Chloride 98 - 111 mmol/L 105  CO2 22 - 32 mmol/L 25  Calcium 8.9 - 10.3 mg/dL 9.0  Total  Protein 6.5 - 8.1 g/dL 6.6  Total Bilirubin 0.3 - 1.2 mg/dL 0.6  Alkaline Phos 38 - 126 U/L 60  AST 15 - 41 U/L 23  ALT 0 - 44 U/L 15   CBC Latest Ref Rng & Units 11/07/2017  WBC 3.6 - 11.0 K/uL 4.5  Hemoglobin 12.0 - 16.0 g/dL 14.0  Hematocrit 35.0 - 47.0 % 40.9  Platelets 150 - 440 K/uL 194    No images are attached to the encounter.  Ct Chest W Contrast  Result Date: 11/06/2017 CLINICAL DATA:  Prior history of laryngeal carcinoma and right breast cancer. History of robotic adrenalectomy 12/28/2016. Palpable knot in the right abdominal area. EXAM: CT CHEST, ABDOMEN, AND PELVIS WITH CONTRAST TECHNIQUE: Multidetector CT imaging of the chest, abdomen and pelvis was performed following the standard protocol during bolus administration of intravenous contrast. CONTRAST:  144mL ISOVUE-300 IOPAMIDOL (ISOVUE-300) INJECTION 61% COMPARISON:  Chest CT 02/17/2017 and PET-CT 11/21/2016. FINDINGS: CT CHEST FINDINGS Cardiovascular: The heart is normal in size. No pericardial effusion. Stable mild tortuosity and calcification of the thoracic aorta but no aneurysm or dissection. The branch vessels are patent. Minimal scattered coronary artery calcifications. Mediastinum/Nodes: No mediastinal or hilar mass or lymphadenopathy. Small scattered lymph nodes are stable. The esophagus is grossly normal. Lungs/Pleura: 3 mm pulmonary nodule at the left lung apex previously measured 2 mm. 13 mm superior segment left lower lobe pulmonary nodule is stable. Patchy areas of subpleural nodularity in atelectasis at the right lung base. Stable 4 mm nodule in the right middle, likely benign lymph nodes along the fissure. Musculoskeletal: No breast masses, supraclavicular or axillary lymphadenopathy. There is a new small nodular density in the right breast measuring 4.5 mm on image number 36. Recommend correlation with mammography. The thyroid gland is grossly normal. Numerous calcifications are noted along the anterior aspect of the  neck. No significant bony findings. CT ABDOMEN PELVIS FINDINGS Hepatobiliary: No focal hepatic lesions or intrahepatic biliary dilatation. The gallbladder appears normal. No common bile duct dilatation. Pancreas: No mass, inflammation or ductal dilatation. Spleen: Normal size.  No focal lesions. Adrenals/Urinary Tract: Surgical changes from a prior right adrenalectomy. There is some persistent soft tissue density in this area but this shows interval contraction since the prior CT scan. This area previously measured 3.3  x 2.8 cm and now measures 2.1 x 1.8 cm. This is likely contracting postop hematoma and granulation tissue. No obvious recurrent mass. Small upper pole left renal cysts along with other scattered renal cysts. No worrisome renal lesions or hydronephrosis. The bladder appears normal. Stomach/Bowel: The stomach, duodenum, small bowel and colon are grossly normal. No acute inflammatory changes, mass lesions or obstructive findings. Stable postoperative changes from a Nissen fundoplication procedure. The terminal ileum and appendix are unremarkable. Vascular/Lymphatic: Stable advanced atherosclerotic calcifications involving the aorta and iliac arteries. No aneurysm or dissection. The branch vessels are patent. The major venous structures are patent. No mesenteric or retroperitoneal mass or adenopathy. Reproductive: The uterus and ovaries are unremarkable. Other: Evidence of prior surgical repair of the right-sided abdominal wall hernia with postoperative scarring changes in the subcutaneous fat. No recurrent hernia. Musculoskeletal: No significant bony findings. IMPRESSION: 1. Stable 13 mm left lower lobe pulmonary nodule. 2. Slight interval enlargement of a left apical pulmonary nodule. Recommend continued surveillance. 3. Stable pulmonary nodules in the right middle lobe, likely benign lymph nodes. 4. No enlarged mediastinal or hilar lymph nodes. 5. Possible new 4.5 mm nodule in the right breast  inferiorly and laterally. Recommend correlation with mammography. 6. Status post Nissen fundoplication.  No complicating features. 7. Status post right adrenalectomy. Contracting postoperative soft tissue in the adrenal gland bed. 8. No findings for metastatic disease involving the abdomen/pelvis. 9. Status post right abdominal wall hernia repair. No recurrent hernia. Electronically Signed   By: Marijo Sanes M.D.   On: 11/06/2017 13:06   Ct Abdomen Pelvis W Contrast  Result Date: 11/06/2017 CLINICAL DATA:  Prior history of laryngeal carcinoma and right breast cancer. History of robotic adrenalectomy 12/28/2016. Palpable knot in the right abdominal area. EXAM: CT CHEST, ABDOMEN, AND PELVIS WITH CONTRAST TECHNIQUE: Multidetector CT imaging of the chest, abdomen and pelvis was performed following the standard protocol during bolus administration of intravenous contrast. CONTRAST:  163mL ISOVUE-300 IOPAMIDOL (ISOVUE-300) INJECTION 61% COMPARISON:  Chest CT 02/17/2017 and PET-CT 11/21/2016. FINDINGS: CT CHEST FINDINGS Cardiovascular: The heart is normal in size. No pericardial effusion. Stable mild tortuosity and calcification of the thoracic aorta but no aneurysm or dissection. The branch vessels are patent. Minimal scattered coronary artery calcifications. Mediastinum/Nodes: No mediastinal or hilar mass or lymphadenopathy. Small scattered lymph nodes are stable. The esophagus is grossly normal. Lungs/Pleura: 3 mm pulmonary nodule at the left lung apex previously measured 2 mm. 13 mm superior segment left lower lobe pulmonary nodule is stable. Patchy areas of subpleural nodularity in atelectasis at the right lung base. Stable 4 mm nodule in the right middle, likely benign lymph nodes along the fissure. Musculoskeletal: No breast masses, supraclavicular or axillary lymphadenopathy. There is a new small nodular density in the right breast measuring 4.5 mm on image number 36. Recommend correlation with mammography. The  thyroid gland is grossly normal. Numerous calcifications are noted along the anterior aspect of the neck. No significant bony findings. CT ABDOMEN PELVIS FINDINGS Hepatobiliary: No focal hepatic lesions or intrahepatic biliary dilatation. The gallbladder appears normal. No common bile duct dilatation. Pancreas: No mass, inflammation or ductal dilatation. Spleen: Normal size.  No focal lesions. Adrenals/Urinary Tract: Surgical changes from a prior right adrenalectomy. There is some persistent soft tissue density in this area but this shows interval contraction since the prior CT scan. This area previously measured 3.3 x 2.8 cm and now measures 2.1 x 1.8 cm. This is likely contracting postop hematoma and granulation tissue. No obvious  recurrent mass. Small upper pole left renal cysts along with other scattered renal cysts. No worrisome renal lesions or hydronephrosis. The bladder appears normal. Stomach/Bowel: The stomach, duodenum, small bowel and colon are grossly normal. No acute inflammatory changes, mass lesions or obstructive findings. Stable postoperative changes from a Nissen fundoplication procedure. The terminal ileum and appendix are unremarkable. Vascular/Lymphatic: Stable advanced atherosclerotic calcifications involving the aorta and iliac arteries. No aneurysm or dissection. The branch vessels are patent. The major venous structures are patent. No mesenteric or retroperitoneal mass or adenopathy. Reproductive: The uterus and ovaries are unremarkable. Other: Evidence of prior surgical repair of the right-sided abdominal wall hernia with postoperative scarring changes in the subcutaneous fat. No recurrent hernia. Musculoskeletal: No significant bony findings. IMPRESSION: 1. Stable 13 mm left lower lobe pulmonary nodule. 2. Slight interval enlargement of a left apical pulmonary nodule. Recommend continued surveillance. 3. Stable pulmonary nodules in the right middle lobe, likely benign lymph nodes. 4. No  enlarged mediastinal or hilar lymph nodes. 5. Possible new 4.5 mm nodule in the right breast inferiorly and laterally. Recommend correlation with mammography. 6. Status post Nissen fundoplication.  No complicating features. 7. Status post right adrenalectomy. Contracting postoperative soft tissue in the adrenal gland bed. 8. No findings for metastatic disease involving the abdomen/pelvis. 9. Status post right abdominal wall hernia repair. No recurrent hernia. Electronically Signed   By: Marijo Sanes M.D.   On: 11/06/2017 13:06     Assessment and plan- Patient is a 62 y.o. female With history of lung nodules and adrenal carcinoma of unknown primary status post resection here to discuss the results of the CT scan  I have reviewed CT chest abdomen and pelvis images independently and discussed findings with the patient.  Overall her lung nodules are stable except forLeft upper lobe nodule which was previously 2 mm is now 3 mm.  There is no evidence of active malignancy noted on her scans.  I will plan to get a repeat CT chest and abdomen and pelvis without contrast in 6 months time to assess these lung nodules as well as to follow-up on the adrenal surgical bed.  Patient noted to have a 4.5 mm nodule in her right breast and she is scheduled to undergo a mammogram in 3 days time.    I will see her back in 6 months after her CT scans  With regards to her abdominal pain I have suggested that she should speak to surgery if her pain persists     Visit Diagnosis 1. Lung nodules   2. Adrenal carcinoma, unspecified laterality ([place])   3. Abnormal findings on diagnostic imaging of lung      Dr. Randa Evens, MD, MPH [facility] [phone] 11/07/2017 3:06 PM

## 2017-11-10 ENCOUNTER — Ambulatory Visit
Admission: RE | Admit: 2017-11-10 | Discharge: 2017-11-10 | Disposition: A | Discharge status: Home / Self Care | Payer: PRIVATE HEALTH INSURANCE | Source: Ambulatory Visit | Attending: Oncology | Admitting: Oncology

## 2017-11-10 ENCOUNTER — Telehealth: Payer: Self-pay

## 2017-11-10 DIAGNOSIS — Z853 Personal history of malignant neoplasm of breast: Secondary | ICD-10-CM

## 2017-11-10 HISTORY — DX: Personal history of irradiation: Z92.3

## 2017-11-10 NOTE — Telephone Encounter (Signed)
-----   Message from Sindy Guadeloupe, MD sent at 11/10/2017 12:40 PM EDT ----- Can you please call patient and find out - she has a 5 mm mass in the right breast. Has a biopsy been set up for this? Thanks, Presenter, broadcasting

## 2017-11-10 NOTE — Telephone Encounter (Signed)
Spoke with the patient and she does states that the breast center is to set up a bx for and her appointment time will be some time next week.The patient is aware of mass in her right breast. The patient has agreed to contact our office with appointment time and date.

## 2017-11-13 ENCOUNTER — Other Ambulatory Visit: Payer: Self-pay | Admitting: Oncology

## 2017-11-13 DIAGNOSIS — R928 Other abnormal and inconclusive findings on diagnostic imaging of breast: Secondary | ICD-10-CM

## 2017-11-13 DIAGNOSIS — N6310 Unspecified lump in the right breast, unspecified quadrant: Secondary | ICD-10-CM

## 2017-11-15 ENCOUNTER — Ambulatory Visit
Admission: RE | Admit: 2017-11-15 | Discharge: 2017-11-15 | Disposition: A | Discharge status: Home / Self Care | Payer: PRIVATE HEALTH INSURANCE | Source: Ambulatory Visit | Attending: Oncology | Admitting: Oncology

## 2017-11-15 DIAGNOSIS — C50919 Malignant neoplasm of unspecified site of unspecified female breast: Secondary | ICD-10-CM

## 2017-11-15 DIAGNOSIS — R928 Other abnormal and inconclusive findings on diagnostic imaging of breast: Secondary | ICD-10-CM

## 2017-11-15 DIAGNOSIS — N6310 Unspecified lump in the right breast, unspecified quadrant: Secondary | ICD-10-CM

## 2017-11-15 HISTORY — PX: BREAST BIOPSY: SHX20

## 2017-11-15 HISTORY — DX: Malignant neoplasm of unspecified site of unspecified female breast: C50.919

## 2017-11-17 NOTE — Progress Notes (Signed)
  Oncology Nurse Navigator Documentation  Navigator Location: [facility] (11/17/17 1300)   )Navigator Encounter Type: Introductory phone call (11/17/17 1300)   Abnormal Finding Date: 11/10/17 (11/17/17 1300) Confirmed Diagnosis Date: 11/15/17 (11/17/17 1300)               Patient Visit Type: Initial (11/17/17 1300) Treatment Phase: Pre-Tx/Tx Discussion (11/17/17 1300) Barriers/Navigation Needs: Coordination of Care;Education (11/17/17 1300) Education: Accessing Care/ Finding Providers;Coping with Diagnosis/ Prognosis;Newly Diagnosed Cancer Education (11/17/17 1300)                        Time Spent with Patient: 45 (11/17/17 1300)   Phoned patient to introduce navigation.  [facility] of[facility] closed this afternoon.  Patientt o be scheduled with Dr. Bary Castilla .  Will call office on Monday.  Patient is aware.

## 2017-11-20 ENCOUNTER — Telehealth: Payer: Self-pay | Admitting: Genetic Counselor

## 2017-11-20 NOTE — Telephone Encounter (Signed)
Dr. Janese Banks is referring Denise Watts for genetic counseling due to a personal and family history of cancer. I left her a message to call and schedule this telegenetics visit to be done by phone at her convenience.   Steele Berg, [place], [place] [facility] Counselor Phone: [phone]

## 2017-11-22 ENCOUNTER — Other Ambulatory Visit: Payer: Self-pay

## 2017-11-22 LAB — SURGICAL PATHOLOGY

## 2017-11-23 ENCOUNTER — Ambulatory Visit: Payer: No Typology Code available for payment source | Admitting: General Surgery

## 2017-11-23 ENCOUNTER — Ambulatory Visit: Payer: Self-pay

## 2017-11-23 ENCOUNTER — Encounter: Payer: Self-pay | Admitting: General Surgery

## 2017-11-23 VITALS — BP 144/84 | HR 72 | Resp 12 | Ht 61.0 in | Wt 137.0 lb

## 2017-11-23 DIAGNOSIS — C50511 Malignant neoplasm of lower-outer quadrant of right female breast: Secondary | ICD-10-CM

## 2017-11-23 DIAGNOSIS — Z171 Estrogen receptor negative status [ER-]: Principal | ICD-10-CM

## 2017-11-23 NOTE — Telephone Encounter (Signed)
Left another message today for Denise Watts to call me to schedule an appointment for genetic counseling.

## 2017-11-23 NOTE — Patient Instructions (Signed)
The patient is aware to call back for any questions or concerns.  

## 2017-11-23 NOTE — Progress Notes (Addendum)
Patient ID: Denise Watts, female   DOB: 11-25-1955, 62 y.o.   MRN: [id number]  Chief Complaint  Patient presents with  . Breast Problem    HPI Denise Watts is a 62 y.o. female.  who presents for a breast evaluation referred by Dr Janese Banks. The most recent right mammogram and biopsy was done on 11-15-17.  Patient does perform regular self breast checks and gets regular mammograms done.  She could not feel anything different in the breast prior to her mammogram.   She has already seen Dr Janese Banks. She is retired from [employer]. She is here with her husband, [name], and daughter, Dena Billet.  HPI  Past Medical History:  Diagnosis Date  . Breast cancer [facility]) 1997   right breast lumpectomy with radiation  . Breast cancer ([place]) 11/15/2017   INVASIVE MAMMARY CARCINOMA/ triple negative  . 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 Cough 03/02/2017   INTERMTTENT DRY COUGH  . GERD (gastroesophageal reflux disease)   . History of hiatal hernia   . Personal history of radiation therapy   . Skin cancer   . Throat cancer [facility]) 1998   radiation    Past Surgical History:  Procedure Laterality Date  . BREAST BIOPSY Right 1997   positive  . BREAST BIOPSY Right 11/15/2017   INVASIVE MAMMARY CARCINOMA triple negative  . BREAST LUMPECTOMY Right 1997  . COLONOSCOPY  2015  . ESOPHAGUS SURGERY    . KNEE SURGERY Right   . ROBOTIC ADRENALECTOMY Right 12/28/2016   Procedure: ROBOTIC ADRENALECTOMY;  Surgeon: Hollice Espy, MD;  Location: [facility] ORS;  Service: Urology;  Laterality: Right;  . SKIN CANCER EXCISION    . THROAT SURGERY  1998   throat cancer   . TUBAL LIGATION    . VENTRAL HERNIA REPAIR N/A 03/03/2017   Procedure: HERNIA REPAIR VENTRAL ADULT;  Surgeon: Clayburn Pert, MD;  Location: [facility] ORS;  Service: General;  Laterality: N/A;    Family History  Problem Relation Age of Onset  . Diabetes Mother   . Heart attack Mother   . Lung cancer Sister   . Bone cancer Sister   . Breast  cancer Maternal Aunt 41  . Prostate cancer Neg Hx   . Kidney cancer Neg Hx   . Bladder Cancer Neg Hx     Social History Social History   Tobacco Use  . Smoking status: Former Smoker    Packs/day: 1.50    Years: 30.00    Pack years: 45.00    Types: Cigarettes    Last attempt to quit: 05/02/1996    Years since quitting: 21.5  . Smokeless tobacco: Never Used  Substance Use Topics  . Alcohol use: No    Alcohol/week: 0.0 oz  . Drug use: No    No Known Allergies  Current Outpatient Medications  Medication Sig Dispense Refill  . omeprazole (PRILOSEC) 40 MG capsule TAKE 1 CAPSULE BY MOUTH EVERY DAY-AM 30 capsule 11   No current facility-administered medications for this visit.     Review of Systems Review of Systems  Constitutional: Negative.   Respiratory: Negative.   Cardiovascular: Negative.     Blood pressure (!) 144/84, pulse 72, resp. rate 12, height 5' 1"  (1.549 m), weight 137 lb (62.1 kg).  Physical Exam Physical Exam  Constitutional: She is oriented to person, place, and time. She appears well-developed and well-nourished.  HENT:  Mouth/Throat: Oropharynx is clear and moist.  Eyes: Conjunctivae are normal. No scleral icterus.  Neck: Neck supple.  Cardiovascular: Normal rate, regular rhythm and normal heart sounds.  Pulmonary/Chest: Effort normal and breath sounds normal. Right breast exhibits skin change. Right breast exhibits no inverted nipple, no mass, no nipple discharge and no tenderness. Left breast exhibits no inverted nipple, no mass, no nipple discharge, no skin change and no tenderness.  Well healed right lumpectomy incision. Mild bruising right breast biopsy site.    Abdominal: Soft. No hernia.    She feels a knot right abdomen above surgery site  Lymphadenopathy:    She has no cervical adenopathy.    She has no axillary adenopathy.  Neurological: She is alert and oriented to person, place, and time.  Skin: Skin is warm and dry.  Psychiatric:  Her behavior is normal.     Data Reviewed  Ultrasound examination of the right breast dated November 10, 2013 showed a 5 x 5 x 6 mm hypoechoic area in the lower outer quadrant.   Surgical Pathology  THIS IS AN ADDENDUM REPORT  CASE: [phone]  PATIENT: Denise Watts  Surgical Pathology Report      Reason for Addendum #1: Additional clinical/test information   SPECIMEN SUBMITTED:  A. Breast, right, 8:00, 3 CMFN   CLINICAL HISTORY:  Mass   PRE-OPERATIVE DIAGNOSIS:  R/O IMC   POST-OPERATIVE DIAGNOSIS:  None provided.      DIAGNOSIS:  A. RIGHT BREAST, 8:00, 3 CM FN; ULTRASOUND-GUIDED CORE BIOPSY:  - INVASIVE MAMMARY CARCINOMA, NO SPECIAL TYPE.   Size of invasive carcinoma: 3 mm in this sample  Histologic grade of invasive carcinoma: Grade 2            Glandular/tubular differentiation score: 3            Nuclear pleomorphism score: 2            Mitotic rate score: 2            Total score: 7  Ductal carcinoma in situ: Not identified  Lymphovascular invasion: Not identified      BREAST BIOMARKER TESTS  Estrogen Receptor (ER) Status: NEGATIVE (less than 1%)  Progesterone Receptor (PgR) Status: NEGATIVE (less than 1%)  HER2 (by immunohistochemistry): NEGATIVE, Score 0   Ultrasound examination of the right breast at the 8 o'clock position, 4 cm from the nipple showed a 0.35 x 0.43 x 0.48 cm hypoechoic nodule, irregular with focal posterior acoustic shadowing.  Biopsy clip evident.  BI-RADS-6.  CT of the abdomen and pelvis dated November 06, 2017 showed soft tissue changes in the area of her previous hernia repair in the right upper quadrant.  No evidence of recurrent hernia. The chest CT portion of the study actually identified the right lower outer quadrant breast nodule.  Assessment    Stage I, T1b carcinoma of the right breast.  Status post previous breast conservation surgery with whole breast radiation 22 years  earlier.  Mild breast asymmetry secondary to weight gain.      Plan    The patient was informed that standard care would recommend mastectomy as she is developed recurrent disease.  With this very long interval of time, and her desire to maintain her breast, we could consider local resection with focal radiation to the area with mastectomy reserved for failure.  If she would require mastectomy, she would desire reconstruction.  Informal discussion with plastic surgery after her visit suggested that this would best be completed as a staged procedure as she will require a latissimus flap.  The patient will consider  whether she would be interested in meeting with radiation oncology for their input about the possibility of breast conservation in spite of her previous radiation.  She is familiar with Dr. Baruch Gouty as he handled her laryngeal radiation in the early 2000's.       HPI, Physical Exam, Assessment and Plan have been scribed under the direction and in the presence of Robert Bellow, MD. Karie Fetch, RN  I have completed the exam and reviewed the above documentation for accuracy and completeness.  I agree with the above.  Haematologist has been used and any errors in dictation or transcription are unintentional.  Hervey Ard, M.D., F.A.C.S.  Forest Gleason Ramani Riva 11/24/2017, 8:57 PM

## 2017-11-24 DIAGNOSIS — Z171 Estrogen receptor negative status [ER-]: Principal | ICD-10-CM

## 2017-11-24 DIAGNOSIS — C50511 Malignant neoplasm of lower-outer quadrant of right female breast: Secondary | ICD-10-CM | POA: Insufficient documentation

## 2017-11-27 ENCOUNTER — Telehealth: Payer: Self-pay

## 2017-11-27 ENCOUNTER — Other Ambulatory Visit: Payer: Self-pay

## 2017-11-27 DIAGNOSIS — C50511 Malignant neoplasm of lower-outer quadrant of right female breast: Secondary | ICD-10-CM

## 2017-11-27 DIAGNOSIS — Z171 Estrogen receptor negative status [ER-]: Principal | ICD-10-CM

## 2017-11-27 NOTE — Telephone Encounter (Signed)
-----   Message from Robert Bellow, MD sent at 11/24/2017  9:07 PM EDT ----- Please notify the patient I reviewed the case with Dr. Baruch Gouty from radiation oncology, and would suggest she have an office visit with him to discuss options for management of her recently diagnosed breast cancer.

## 2017-11-27 NOTE — Telephone Encounter (Signed)
Patient will be seeing Dr Baruch Gouty on 12/04/17 at 9:30 am. She is aware of date, time, and instructions.

## 2017-11-27 NOTE — Progress Notes (Signed)
re

## 2017-11-27 NOTE — Telephone Encounter (Signed)
3rd and last message left for Ms. Furry to return my call for genetic counseling. Closing referral, but she is welcome to call anytime she is ready.  Steele Berg, MS, [facility] Counselor Phone: [phone] [email or web address]

## 2017-11-28 ENCOUNTER — Telehealth: Payer: Self-pay

## 2017-11-28 ENCOUNTER — Other Ambulatory Visit: Payer: Self-pay

## 2017-11-28 DIAGNOSIS — Z171 Estrogen receptor negative status [ER-]: Principal | ICD-10-CM

## 2017-11-28 DIAGNOSIS — C50511 Malignant neoplasm of lower-outer quadrant of right female breast: Secondary | ICD-10-CM

## 2017-11-28 MED ORDER — LIDOCAINE-PRILOCAINE 2.5-2.5 % EX CREA: 1.0000 "application " | TOPICAL_CREAM | CUTANEOUS | 0 refills | Status: DC | PRN

## 2017-11-28 NOTE — Telephone Encounter (Signed)
Call to patient to speak about scheduling surgery. The patient is scheduled for surgery at [facility] on 12/08/17. She will pre admit by phone. The patient will arrive on 12/08/17 to the Radiology desk in the [facility] at 9:45 am. She is aware to apple the EMLA cream to her right areola one hour prior to leaving for the hospital on 12/08/17. The patient is aware of date, time, and instructions. Surgery instructions have been mailed to the patient.

## 2017-11-29 ENCOUNTER — Other Ambulatory Visit: Payer: Self-pay | Admitting: General Surgery

## 2017-11-29 DIAGNOSIS — C50511 Malignant neoplasm of lower-outer quadrant of right female breast: Secondary | ICD-10-CM

## 2017-11-29 DIAGNOSIS — Z171 Estrogen receptor negative status [ER-]: Principal | ICD-10-CM

## 2017-11-29 NOTE — Progress Notes (Signed)
The patient has previously undergone a complete axillary dissection.  She will undergo sentinel node injection and if uptake is appreciated in the axilla, attempted sentinel node biopsy will be undertaken.  If no uptake is noted, blind read dissection of the axilla will not be undertaken.  We will plan to place marking clip at the biopsy site to assist with accelerated external beam radiation.

## 2017-12-01 ENCOUNTER — Other Ambulatory Visit: Payer: Self-pay

## 2017-12-01 ENCOUNTER — Encounter
Admission: RE | Admit: 2017-12-01 | Discharge: 2017-12-01 | Disposition: A | Discharge status: Home / Self Care | Payer: PRIVATE HEALTH INSURANCE | Source: Ambulatory Visit | Attending: General Surgery | Admitting: General Surgery

## 2017-12-01 HISTORY — DX: Unspecified osteoarthritis, unspecified site: M19.90

## 2017-12-01 NOTE — Patient Instructions (Addendum)
Your procedure is scheduled on: 12-08-17 FRIDAY Report to [place] @ 9:45 AM  Remember: Instructions that are not followed completely may result in serious medical risk, up to and including death, or upon the discretion of your surgeon and anesthesiologist your surgery may need to be rescheduled.    _x___ 1. Do not eat food after midnight the night before your procedure. NO GUM OR CANDY AFTER MIDNIGHT.  You may drink clear liquids up to 2 hours before you are scheduled to arrive at the hospital for your procedure.  Do not drink clear liquids within 2 hours of your scheduled arrival to the hospital.  Clear liquids include  --Water or Apple juice without pulp  --Clear carbohydrate beverage such as ClearFast or Gatorade  --Black Coffee or Clear Tea (No milk, no creamers, do not add anything to the coffee or Tea    ____Ensure clear carbohydrate drink on the way to the hospital for bariatric patients  ____Ensure clear carbohydrate drink 3 hours before surgery for Dr Dwyane Luo patients if physician instructed.      __x__ 2. No Alcohol for 24 hours before or after surgery.   __x__3. No Smoking or e-cigarettes for 24 prior to surgery.  Do not use any chewable tobacco products for at least 6 hour prior to surgery   ____  4. Bring all medications with you on the day of surgery if instructed.    __x__ 5. Notify your doctor if there is any change in your medical condition     (cold, fever, infections).    x___6. On the morning of surgery brush your teeth with toothpaste and water.  You may rinse your mouth with mouth wash if you wish.  Do not swallow any toothpaste or mouthwash.   Do not wear jewelry, make-up, hairpins, clips or nail polish.  Do not wear lotions, powders, or perfumes. You may wear deodorant.  Do not shave 48 hours prior to surgery. Men may shave face and neck.  Do not bring valuables to the hospital.    [facility] is not responsible for any belongings or  valuables.               Contacts, dentures or bridgework may not be worn into surgery.  Leave your suitcase in the car. After surgery it may be brought to your room.  For patients admitted to the hospital, discharge time is determined by your treatment team.  _  Patients discharged the day of surgery will not be allowed to drive home.  You will need someone to drive you home and stay with you the night of your procedure.    Please read over the following fact sheets that you were given:   [facility] Preparing for Surgery  _x___ TAKE THE FOLLOWING MEDICATION THE MORNING OF SURGERY WITH A SMALL SIP OF WATER. These include:  1. PRILOSEC  2. TAKE AN EXTRA PRILOSEC THE NIGHT BEFORE YOUR SURGERY  3. APPLY EMLA CREAM TO RIGHT AREOLA DAY OF SURGERY AS INSTRUCTED BY DR BYRNETTS OFFICE  4.  5.  6.  ____Fleets enema or Magnesium Citrate as directed.   ____ Use CHG Soap or sage wipes as directed on instruction sheet   ____ Use inhalers on the day of surgery and bring to hospital day of surgery  ____ Stop Metformin and Janumet 2 days prior to surgery.    ____ Take 1/2 of usual insulin dose the night before surgery and none on the morning surgery.  ____ Follow recommendations from Cardiologist, Pulmonologist or PCP regarding stopping Aspirin, Coumadin, Plavix ,Eliquis, Effient, or Pradaxa, and Pletal.  ____Stop Anti-inflammatories such as Advil, Aleve, Ibuprofen, Motrin, Naproxen, Naprosyn, Goodies powders or aspirin products. OK to take Tylenol    ____ Stop supplements until after surgery.     ____ Bring C-Pap to the hospital.

## 2017-12-04 ENCOUNTER — Ambulatory Visit
Admission: RE | Admit: 2017-12-04 | Discharge: 2017-12-04 | Disposition: A | Discharge status: Home / Self Care | Payer: PRIVATE HEALTH INSURANCE | Source: Ambulatory Visit | Attending: Radiation Oncology | Admitting: Radiation Oncology

## 2017-12-04 ENCOUNTER — Other Ambulatory Visit: Payer: Self-pay

## 2017-12-04 ENCOUNTER — Encounter: Payer: Self-pay | Admitting: Radiation Oncology

## 2017-12-04 VITALS — BP 135/84 | HR 66 | Temp 97.2°F | Resp 20 | Wt 138.1 lb

## 2017-12-04 DIAGNOSIS — Z808 Family history of malignant neoplasm of other organs or systems: Secondary | ICD-10-CM | POA: Diagnosis not present

## 2017-12-04 DIAGNOSIS — C50511 Malignant neoplasm of lower-outer quadrant of right female breast: Secondary | ICD-10-CM | POA: Diagnosis not present

## 2017-12-04 DIAGNOSIS — Z803 Family history of malignant neoplasm of breast: Secondary | ICD-10-CM | POA: Insufficient documentation

## 2017-12-04 DIAGNOSIS — Z923 Personal history of irradiation: Secondary | ICD-10-CM | POA: Insufficient documentation

## 2017-12-04 DIAGNOSIS — Z87891 Personal history of nicotine dependence: Secondary | ICD-10-CM | POA: Diagnosis not present

## 2017-12-04 DIAGNOSIS — K449 Diaphragmatic hernia without obstruction or gangrene: Secondary | ICD-10-CM | POA: Insufficient documentation

## 2017-12-04 DIAGNOSIS — M129 Arthropathy, unspecified: Secondary | ICD-10-CM | POA: Diagnosis not present

## 2017-12-04 DIAGNOSIS — Z171 Estrogen receptor negative status [ER-]: Secondary | ICD-10-CM | POA: Insufficient documentation

## 2017-12-04 DIAGNOSIS — Z8521 Personal history of malignant neoplasm of larynx: Secondary | ICD-10-CM | POA: Insufficient documentation

## 2017-12-04 DIAGNOSIS — K219 Gastro-esophageal reflux disease without esophagitis: Secondary | ICD-10-CM | POA: Insufficient documentation

## 2017-12-04 DIAGNOSIS — Z801 Family history of malignant neoplasm of trachea, bronchus and lung: Secondary | ICD-10-CM | POA: Insufficient documentation

## 2017-12-04 NOTE — Consult Note (Signed)
NEW PATIENT EVALUATION  Name: Denise Watts  MRN: [id number]  Date:   12/04/2017     DOB: 02-20-1956   This 62 y.o. 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  REFERRING PHYSICIAN: Chrismon, Vickki Muff, PA  CHIEF COMPLAINT:  Chief Complaint  Patient presents with  . Breast Cancer    Pt is here for initial consulationof breast cancer.      DIAGNOSIS: The encounter diagnosis was Malignant neoplasm of lower-outer quadrant of right breast of female, estrogen receptor negative ([place]).   PREVIOUS INVESTIGATIONS:  Pathology reports reviewed CT scans PET/CT scans mammogram and ultrasound all reviewed Clinical notes reviewed  HPI: Denise Watts is a 62 year old female previous patient of mine treated back in1997 to her right breast with surgery and radiation therapy for early stage breast cancer. She's also had radiation therapy 1998 4 squamous cell carcinoma of the larynx. She's been undersurveillance by medical oncology and back in July 2018 CT scan demonstrated adrenal mass which was PET positive. She underwent resection which was positive for high-grade carcinoma with extensive necrosis with margins negative.she also was noted to have a 4.5 mm nodule in her right breast underwent mammography. This nodule was picked up on CT scan. Mammography and ultrasounddemonstrated a 6 x 5 mm hypoechoic mass within the right breast at the 8:00 position 3 cm from the nipple.she underwent biopsy showing invasive mammary carcinomatype. Tumor was triple negative. Patient is scheduled for reexcision in case has been discussed at our tumor board. We are planning to do partial breast irradiation and the hypofractionated regiment using image guided technique after clips of them placed at the time of  resection. Patient is otherwise doing well. She specifically denies breast tenderness cough or bone pain.  PLANNED TREATMENT REGIMEN: accelerated partial breast irradiation using external beam treatment to her right breast in 62 year old female  PAST MEDICAL HISTORY:  has a past medical history of Arthritis, Breast cancer ([place]) (1997), Breast cancer ([place]) (11/15/2017), 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[place]) (11/10/2014), Cough (03/02/2017), GERD (gastroesophageal reflux disease), History of hiatal hernia, Personal history of radiation therapy, Skin cancer, and Throat cancer ([place]) (1998).    PAST SURGICAL HISTORY:  Past Surgical History:  Procedure Laterality Date  . BREAST BIOPSY Right 1997   positive  . BREAST BIOPSY Right 11/15/2017   INVASIVE MAMMARY CARCINOMA triple negative  . BREAST LUMPECTOMY Right 1997  . COLONOSCOPY  2015  . ESOPHAGUS SURGERY    . KNEE SURGERY Right   . ROBOTIC ADRENALECTOMY Right 12/28/2016   Procedure: ROBOTIC ADRENALECTOMY;  Surgeon: Hollice Espy, MD;  Location: [facility] ORS;  Service: Urology;  Laterality: Right;  . SKIN CANCER EXCISION    . THROAT SURGERY  1998   throat cancer   . TUBAL LIGATION    . VENTRAL HERNIA REPAIR N/A 03/03/2017   Procedure: HERNIA REPAIR VENTRAL ADULT;  Surgeon: Clayburn Pert, MD;  Location: [facility] ORS;  Service: General;  Laterality: N/A;    FAMILY HISTORY: family history includes Bone cancer in her sister; Breast cancer (age of onset: 48) in her maternal aunt; Diabetes in her mother; Heart attack in her mother; Lung cancer in her sister.  SOCIAL HISTORY:  reports that she quit smoking about 21 years ago. Her smoking use included cigarettes. She has a 45.00 pack-year smoking history. She has never used  smokeless tobacco. She reports that she drinks alcohol. She reports that she does not use drugs.  ALLERGIES: Patient has no known allergies.  MEDICATIONS:  Current Outpatient Medications  Medication Sig Dispense Refill  .  lidocaine-prilocaine (EMLA) cream Apply 1 application topically as needed. Apply to the right areola and cover with plastic wrap one hour prior to leaving for surgery. 5 g 0  . naproxen sodium (ALEVE) 220 MG tablet Take 440 mg by mouth daily as needed (pain).    Marland Kitchen omeprazole (PRILOSEC) 40 MG capsule TAKE 1 CAPSULE BY MOUTH EVERY DAY-AM 30 capsule 11   No current facility-administered medications for this encounter.     ECOG PERFORMANCE STATUS:  0 - Asymptomatic  REVIEW OF SYSTEMS:  Patient denies any weight loss, fatigue, weakness, fever, chills or night sweats. Patient denies any loss of vision, blurred vision. Patient denies any ringing  of the ears or hearing loss. No irregular heartbeat. Patient denies heart murmur or history of fainting. Patient denies any chest pain or pain radiating to her upper extremities. Patient denies any shortness of breath, difficulty breathing at night, cough or hemoptysis. Patient denies any swelling in the lower legs. Patient denies any nausea vomiting, vomiting of blood, or coffee ground material in the vomitus. Patient denies any stomach pain. Patient states has had normal bowel movements no significant constipation or diarrhea. Patient denies any dysuria, hematuria or significant nocturia. Patient denies any problems walking, swelling in the joints or loss of balance. Patient denies any skin changes, loss of hair or loss of weight. Patient denies any excessive worrying or anxiety or significant depression. Patient denies any problems with insomnia. Patient denies excessive thirst, polyuria, polydipsia. Patient denies any swollen glands, patient denies easy bruising or easy bleeding. Patient denies any recent infections, allergies or URI. Patient "s visual fields have not changed significantly in recent time.    PHYSICAL EXAM: BP 135/84   Pulse 66   Temp (!) 97.2 F (36.2 C)   Resp 20   Wt 138 lb 1.9 oz (62.6 kg)   BMI 26.10 kg/m  No dominant mass or  nodularity is noted in either breast in 2 positions examined. No axillary or supraclavicular adenopathy is identified.Well-developed well-nourished patient in NAD. HEENT reveals PERLA, EOMI, discs not visualized.  Oral cavity is clear. No oral mucosal lesions are identified. Neck is clear without evidence of cervical or supraclavicular adenopathy. Lungs are clear to A&P. Cardiac examination is essentially unremarkable with regular rate and rhythm without murmur rub or thrill. Abdomen is benign with no organomegaly or masses noted. Motor sensory and DTR levels are equal and symmetric in the upper and lower extremities. Cranial nerves II through XII are grossly intact. Proprioception is intact. No peripheral adenopathy or edema is identified. No motor or sensory levels are noted. Crude visual fields are within normal range.  LABORATORY DATA: pathology reports reviewed    RADIOLOGY RESULTS:mammogram ultrasound PET CT and CT scans all reviewed and compatible with the above-stated findings   IMPRESSION: a recurrent probable stage I invasive mammary carcinoma the right breast and patient previous he treated in 1997 with whole breast radiation.  PLAN: at this time patient will undergo wide local excision with clip placement for image guided therapy.Would plan on delivering 38 gray in 10 fractions at3.8 cGy per fraction given twice a day. Risks and benefits of treatment including skin reaction fatigue alteration of blood counts and possibility of fat necrosis of the breast secondary to repeating treatments all were discussed  in detail. Patient knows that mastectomy would be the option should this fail at this time. I discussed the case personally with Dr. Tollie Pizza. He will be doing wide local excision and attempted sentinel node biopsy this Friday I will see her back in about 2 weeks to review her final pathology and make preparation for CT simulation. Patient seems to comprehend my treatment plan well.  I would  like to take this opportunity to thank you for allowing me to participate in the care of your patient.Noreene Filbert, MD

## 2017-12-06 ENCOUNTER — Telehealth: Payer: Self-pay | Admitting: *Deleted

## 2017-12-06 NOTE — Telephone Encounter (Signed)
Called pt and let her know that Dr. Janese Banks wanted to see her after she has surgery. I told pt that her surgery was scheduled for 8/9 so if pt could come on 8/15 at 10:45 and she was agreeable to this. appt made.

## 2017-12-07 MED ORDER — CEFAZOLIN SODIUM-DEXTROSE 2-4 GM/100ML-% IV SOLN
2.0000 g | INTRAVENOUS | Status: AC
  Administered 2017-12-08: 2 g via INTRAVENOUS

## 2017-12-08 ENCOUNTER — Other Ambulatory Visit: Payer: Self-pay

## 2017-12-08 ENCOUNTER — Ambulatory Visit: Payer: PRIVATE HEALTH INSURANCE

## 2017-12-08 ENCOUNTER — Encounter
Admission: RE | Disposition: A | Discharge status: Home / Self Care | Payer: Self-pay | Source: Ambulatory Visit | Attending: General Surgery

## 2017-12-08 ENCOUNTER — Ambulatory Visit: Payer: PRIVATE HEALTH INSURANCE | Admitting: Anesthesiology

## 2017-12-08 ENCOUNTER — Encounter
Admission: RE | Admit: 2017-12-08 | Discharge: 2017-12-08 | Disposition: A | Discharge status: Home / Self Care | Payer: PRIVATE HEALTH INSURANCE | Source: Ambulatory Visit | Attending: General Surgery | Admitting: General Surgery

## 2017-12-08 ENCOUNTER — Ambulatory Visit
Admission: RE | Admit: 2017-12-08 | Discharge: 2017-12-08 | Disposition: A | Discharge status: Home / Self Care | Payer: PRIVATE HEALTH INSURANCE | Source: Ambulatory Visit | Attending: General Surgery | Admitting: General Surgery

## 2017-12-08 DIAGNOSIS — Z923 Personal history of irradiation: Secondary | ICD-10-CM | POA: Insufficient documentation

## 2017-12-08 DIAGNOSIS — Z808 Family history of malignant neoplasm of other organs or systems: Secondary | ICD-10-CM | POA: Insufficient documentation

## 2017-12-08 DIAGNOSIS — N6310 Unspecified lump in the right breast, unspecified quadrant: Secondary | ICD-10-CM

## 2017-12-08 DIAGNOSIS — K449 Diaphragmatic hernia without obstruction or gangrene: Secondary | ICD-10-CM | POA: Insufficient documentation

## 2017-12-08 DIAGNOSIS — Z79899 Other long term (current) drug therapy: Secondary | ICD-10-CM | POA: Diagnosis not present

## 2017-12-08 DIAGNOSIS — C50511 Malignant neoplasm of lower-outer quadrant of right female breast: Secondary | ICD-10-CM

## 2017-12-08 DIAGNOSIS — Z853 Personal history of malignant neoplasm of breast: Secondary | ICD-10-CM | POA: Diagnosis not present

## 2017-12-08 DIAGNOSIS — M1990 Unspecified osteoarthritis, unspecified site: Secondary | ICD-10-CM | POA: Insufficient documentation

## 2017-12-08 DIAGNOSIS — Z85828 Personal history of other malignant neoplasm of skin: Secondary | ICD-10-CM | POA: Insufficient documentation

## 2017-12-08 DIAGNOSIS — Z803 Family history of malignant neoplasm of breast: Secondary | ICD-10-CM | POA: Insufficient documentation

## 2017-12-08 DIAGNOSIS — Z801 Family history of malignant neoplasm of trachea, bronchus and lung: Secondary | ICD-10-CM | POA: Diagnosis not present

## 2017-12-08 DIAGNOSIS — Z8249 Family history of ischemic heart disease and other diseases of the circulatory system: Secondary | ICD-10-CM | POA: Diagnosis not present

## 2017-12-08 DIAGNOSIS — K219 Gastro-esophageal reflux disease without esophagitis: Secondary | ICD-10-CM | POA: Insufficient documentation

## 2017-12-08 DIAGNOSIS — Z171 Estrogen receptor negative status [ER-]: Secondary | ICD-10-CM

## 2017-12-08 DIAGNOSIS — Z170 Estrogen receptor positive status [ER+]: Secondary | ICD-10-CM | POA: Insufficient documentation

## 2017-12-08 DIAGNOSIS — Z833 Family history of diabetes mellitus: Secondary | ICD-10-CM | POA: Diagnosis not present

## 2017-12-08 DIAGNOSIS — Z87891 Personal history of nicotine dependence: Secondary | ICD-10-CM | POA: Diagnosis not present

## 2017-12-08 DIAGNOSIS — Z8521 Personal history of malignant neoplasm of larynx: Secondary | ICD-10-CM | POA: Diagnosis not present

## 2017-12-08 DIAGNOSIS — C50911 Malignant neoplasm of unspecified site of right female breast: Secondary | ICD-10-CM | POA: Diagnosis present

## 2017-12-08 HISTORY — PX: BREAST LUMPECTOMY WITH SENTINEL LYMPH NODE BIOPSY: SHX5597

## 2017-12-08 SURGERY — BREAST LUMPECTOMY WITH SENTINEL LYMPH NODE BX
Anesthesia: General | Laterality: Right | Wound class: Clean

## 2017-12-08 MED ORDER — METHYLENE BLUE 0.5 % INJ SOLN
INTRAVENOUS | Status: AC
  Filled 2017-12-08: qty 10

## 2017-12-08 MED ORDER — HYDROCODONE-ACETAMINOPHEN 5-325 MG PO TABS: 1.0000 | ORAL_TABLET | ORAL | 0 refills | Status: DC | PRN

## 2017-12-08 MED ORDER — MEPERIDINE HCL 50 MG/ML IJ SOLN: 6.2500 mg | INTRAMUSCULAR | Status: DC | PRN

## 2017-12-08 MED ORDER — OXYCODONE HCL 5 MG PO TABS
ORAL_TABLET | ORAL | Status: AC
  Administered 2017-12-08: 5 mg via ORAL
  Filled 2017-12-08: qty 1

## 2017-12-08 MED ORDER — LIDOCAINE HCL (CARDIAC) PF 100 MG/5ML IV SOSY
PREFILLED_SYRINGE | INTRAVENOUS | Status: DC | PRN
  Administered 2017-12-08: 60 mg via INTRAVENOUS

## 2017-12-08 MED ORDER — FENTANYL CITRATE (PF) 100 MCG/2ML IJ SOLN
INTRAMUSCULAR | Status: DC | PRN
  Administered 2017-12-08 (×2): 50 ug via INTRAVENOUS

## 2017-12-08 MED ORDER — LIDOCAINE HCL (PF) 2 % IJ SOLN
INTRAMUSCULAR | Status: AC
  Filled 2017-12-08: qty 10

## 2017-12-08 MED ORDER — TECHNETIUM TC 99M SULFUR COLLOID FILTERED
0.8600 | Freq: Once | INTRAVENOUS | Status: AC | PRN
  Administered 2017-12-08: 0.86 via INTRADERMAL

## 2017-12-08 MED ORDER — LACTATED RINGERS IV SOLN
INTRAVENOUS | Status: DC
  Administered 2017-12-08: 11:00:00 via INTRAVENOUS

## 2017-12-08 MED ORDER — OXYCODONE HCL 5 MG/5ML PO SOLN: 5.0000 mg | Freq: Once | ORAL | Status: AC | PRN

## 2017-12-08 MED ORDER — DEXAMETHASONE SODIUM PHOSPHATE 10 MG/ML IJ SOLN
INTRAMUSCULAR | Status: DC | PRN
  Administered 2017-12-08: 5 mg via INTRAVENOUS

## 2017-12-08 MED ORDER — FENTANYL CITRATE (PF) 100 MCG/2ML IJ SOLN
INTRAMUSCULAR | Status: AC
  Filled 2017-12-08: qty 2

## 2017-12-08 MED ORDER — ACETAMINOPHEN 10 MG/ML IV SOLN
INTRAVENOUS | Status: AC
  Filled 2017-12-08: qty 100

## 2017-12-08 MED ORDER — ONDANSETRON HCL 4 MG/2ML IJ SOLN
INTRAMUSCULAR | Status: DC | PRN
  Administered 2017-12-08: 4 mg via INTRAVENOUS

## 2017-12-08 MED ORDER — PROPOFOL 10 MG/ML IV BOLUS
INTRAVENOUS | Status: AC
  Filled 2017-12-08: qty 20

## 2017-12-08 MED ORDER — FENTANYL CITRATE (PF) 100 MCG/2ML IJ SOLN
INTRAMUSCULAR | Status: AC
  Administered 2017-12-08: 50 ug via INTRAVENOUS
  Filled 2017-12-08: qty 2

## 2017-12-08 MED ORDER — BUPIVACAINE-EPINEPHRINE (PF) 0.5% -1:200000 IJ SOLN
INTRAMUSCULAR | Status: AC
  Filled 2017-12-08: qty 30

## 2017-12-08 MED ORDER — CEFAZOLIN SODIUM-DEXTROSE 2-4 GM/100ML-% IV SOLN
INTRAVENOUS | Status: AC
  Filled 2017-12-08: qty 100

## 2017-12-08 MED ORDER — PROMETHAZINE HCL 25 MG/ML IJ SOLN
6.2500 mg | INTRAMUSCULAR | Status: DC | PRN
Start: 2017-12-08 — End: 2017-12-08

## 2017-12-08 MED ORDER — BUPIVACAINE-EPINEPHRINE (PF) 0.5% -1:200000 IJ SOLN
INTRAMUSCULAR | Status: DC | PRN
  Administered 2017-12-08: 30 mL via PERINEURAL

## 2017-12-08 MED ORDER — OXYCODONE HCL 5 MG PO TABS
5.0000 mg | ORAL_TABLET | Freq: Once | ORAL | Status: AC | PRN
  Administered 2017-12-08: 5 mg via ORAL

## 2017-12-08 MED ORDER — ACETAMINOPHEN 10 MG/ML IV SOLN
INTRAVENOUS | Status: DC | PRN
  Administered 2017-12-08: 1000 mg via INTRAVENOUS

## 2017-12-08 MED ORDER — FENTANYL CITRATE (PF) 100 MCG/2ML IJ SOLN
25.0000 ug | INTRAMUSCULAR | Status: DC | PRN
  Administered 2017-12-08 (×3): 50 ug via INTRAVENOUS

## 2017-12-08 MED ORDER — PROPOFOL 10 MG/ML IV BOLUS
INTRAVENOUS | Status: DC | PRN
  Administered 2017-12-08: 25 mg via INTRAVENOUS
  Administered 2017-12-08: 120 mg via INTRAVENOUS

## 2017-12-08 SURGICAL SUPPLY — 55 items
BINDER BREAST LRG (GAUZE/BANDAGES/DRESSINGS) ×3 IMPLANT
BINDER BREAST MEDIUM (GAUZE/BANDAGES/DRESSINGS) IMPLANT
BINDER BREAST XLRG (GAUZE/BANDAGES/DRESSINGS) IMPLANT
BINDER BREAST XXLRG (GAUZE/BANDAGES/DRESSINGS) IMPLANT
BLADE SURG 15 STRL SS SAFETY (BLADE) ×6 IMPLANT
BULB RESERV EVAC DRAIN JP 100C (MISCELLANEOUS) IMPLANT
CANISTER SUCT 1200ML W/VALVE (MISCELLANEOUS) ×3 IMPLANT
CHLORAPREP W/TINT 26ML (MISCELLANEOUS) ×3 IMPLANT
CLIP VESOCCLUDE MED 6/CT (CLIP) ×3 IMPLANT
CLOSURE WOUND 1/2 X4 (GAUZE/BANDAGES/DRESSINGS) ×1
CNTNR SPEC 2.5X3XGRAD LEK (MISCELLANEOUS)
CONT SPEC 4OZ STER OR WHT (MISCELLANEOUS)
CONTAINER SPEC 2.5X3XGRAD LEK (MISCELLANEOUS) IMPLANT
COVER PROBE FLX POLY STRL (MISCELLANEOUS) ×3 IMPLANT
DEVICE DUBIN SPECIMEN MAMMOGRA (MISCELLANEOUS) ×3 IMPLANT
DRAIN CHANNEL JP 15F RND 16 (MISCELLANEOUS) IMPLANT
DRAPE LAPAROTOMY TRNSV 106X77 (MISCELLANEOUS) ×3 IMPLANT
DRSG GAUZE FLUFF 36X18 (GAUZE/BANDAGES/DRESSINGS) ×6 IMPLANT
DRSG TELFA 3X8 NADH (GAUZE/BANDAGES/DRESSINGS) ×6 IMPLANT
ELECT CAUTERY BLADE TIP 2.5 (TIP) ×3
ELECT REM PT RETURN 9FT ADLT (ELECTROSURGICAL) ×3
ELECTRODE CAUTERY BLDE TIP 2.5 (TIP) ×1 IMPLANT
ELECTRODE REM PT RTRN 9FT ADLT (ELECTROSURGICAL) ×1 IMPLANT
GAUZE SPONGE 4X4 12PLY STRL (GAUZE/BANDAGES/DRESSINGS) ×3 IMPLANT
GLOVE BIO SURGEON STRL SZ7.5 (GLOVE) ×3 IMPLANT
GLOVE INDICATOR 8.0 STRL GRN (GLOVE) ×3 IMPLANT
GOWN STRL REUS W/ TWL LRG LVL3 (GOWN DISPOSABLE) ×2 IMPLANT
GOWN STRL REUS W/TWL LRG LVL3 (GOWN DISPOSABLE) ×4
KIT TURNOVER KIT A (KITS) ×3 IMPLANT
LABEL OR SOLS (LABEL) ×3 IMPLANT
MARGIN MAP 10MM (MISCELLANEOUS) ×3 IMPLANT
NEEDLE HYPO 22GX1.5 SAFETY (NEEDLE) ×3 IMPLANT
NEEDLE HYPO 25X1 1.5 SAFETY (NEEDLE) ×6 IMPLANT
PACK BASIN MINOR ARMC (MISCELLANEOUS) ×3 IMPLANT
RETRACTOR RING XSMALL (MISCELLANEOUS) ×1 IMPLANT
RTRCTR WOUND ALEXIS 13CM XS SH (MISCELLANEOUS) ×3
SHEARS FOC LG CVD HARMONIC 17C (MISCELLANEOUS) IMPLANT
SHEARS HARMONIC 9CM CVD (BLADE) ×3 IMPLANT
SLEVE PROBE SENORX GAMMA FIND (MISCELLANEOUS) ×3 IMPLANT
STRIP CLOSURE SKIN 1/2X4 (GAUZE/BANDAGES/DRESSINGS) ×2 IMPLANT
SUT ETHILON 3-0 FS-10 30 BLK (SUTURE) ×3
SUT SILK 2 0 (SUTURE) ×2
SUT SILK 2-0 18XBRD TIE 12 (SUTURE) ×1 IMPLANT
SUT VIC AB 2-0 CT1 27 (SUTURE) ×6
SUT VIC AB 2-0 CT1 TAPERPNT 27 (SUTURE) ×3 IMPLANT
SUT VIC AB 3-0 SH 27 (SUTURE) ×4
SUT VIC AB 3-0 SH 27X BRD (SUTURE) ×2 IMPLANT
SUT VIC AB 4-0 FS2 27 (SUTURE) ×6 IMPLANT
SUT VICRYL+ 3-0 144IN (SUTURE) ×3 IMPLANT
SUTURE EHLN 3-0 FS-10 30 BLK (SUTURE) ×1 IMPLANT
SWABSTK COMLB BENZOIN TINCTURE (MISCELLANEOUS) ×3 IMPLANT
SYR 10ML LL (SYRINGE) ×3 IMPLANT
SYR BULB IRRIG 60ML STRL (SYRINGE) ×3 IMPLANT
TAPE TRANSPORE STRL 2 31045 (GAUZE/BANDAGES/DRESSINGS) ×3 IMPLANT
WATER STERILE IRR 1000ML POUR (IV SOLUTION) ×3 IMPLANT

## 2017-12-08 NOTE — Anesthesia Preprocedure Evaluation (Signed)
Anesthesia Evaluation  Patient identified by MRN, date of birth, ID band Patient awake    Reviewed: Allergy & Precautions, NPO status , Patient's Chart, lab work & pertinent test results  History of Anesthesia Complications Negative for: history of anesthetic complications  Airway Mallampati: III  TM Distance: >3 FB Neck ROM: Full    Dental  (+) Upper Dentures, Lower Dentures   Pulmonary neg sleep apnea, neg COPD, former smoker,    breath sounds clear to auscultation- rhonchi (-) wheezing      Cardiovascular Exercise Tolerance: Good (-) hypertension(-) CAD, (-) Past MI, (-) Cardiac Stents and (-) CABG  Rhythm:Regular Rate:Normal - Systolic murmurs and - Diastolic murmurs    Neuro/Psych negative neurological ROS  negative psych ROS   GI/Hepatic Neg liver ROS, hiatal hernia, GERD  Controlled and Medicated,  Endo/Other  negative endocrine ROSneg diabetes  Renal/GU negative Renal ROS     Musculoskeletal  (+) Arthritis ,   Abdominal (+) - obese,   Peds  Hematology negative hematology ROS (+)   Anesthesia Other Findings Past Medical History: No date: Arthritis     Comment:  KNEE RIGHT 1997: Breast cancer ([place])     Comment:  right breast lumpectomy with radiation 11/15/2017: Breast cancer ([place])     Comment:  INVASIVE MAMMARY CARCINOMA/ triple negative 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[place]) 03/02/2017: Cough     Comment:  INTERMTTENT DRY COUGH No date: GERD (gastroesophageal reflux disease) No date: History of hiatal hernia No date: Personal history of radiation therapy No date: Skin cancer 1998: Throat cancer ([place])     Comment:  radiation   Reproductive/Obstetrics                             Anesthesia Physical Anesthesia Plan  ASA: II  Anesthesia Plan: General   Post-op Pain Management:    Induction: Intravenous  PONV Risk Score and Plan: 2 and Ondansetron, Dexamethasone and  Midazolam  Airway Management Planned: LMA  Additional Equipment:   Intra-op Plan:   Post-operative Plan:   Informed Consent: I have reviewed the patients History and Physical, chart, labs and discussed the procedure including the risks, benefits and alternatives for the proposed anesthesia with the patient or authorized representative who has indicated his/her understanding and acceptance.   Dental advisory given  Plan Discussed with: CRNA and Anesthesiologist  Anesthesia Plan Comments:         Anesthesia Quick Evaluation

## 2017-12-08 NOTE — Anesthesia Post-op Follow-up Note (Signed)
Anesthesia QCDR form completed.        

## 2017-12-08 NOTE — OR Nursing (Signed)
Discharge instructions discussed with pt and family. All voicer understanding.

## 2017-12-08 NOTE — Anesthesia Procedure Notes (Signed)
Procedure Name: LMA Insertion Date/Time: 12/08/2017 10:58 AM Performed by: Zetta Bills, CRNA Pre-anesthesia Checklist: Patient identified, Emergency Drugs available, Suction available and Patient being monitored Patient Re-evaluated:Patient Re-evaluated prior to induction Oxygen Delivery Method: Circle system utilized Preoxygenation: Pre-oxygenation with 100% oxygen Induction Type: IV induction Ventilation: Mask ventilation without difficulty LMA: LMA inserted LMA Size: 3.0 Number of attempts: 2 Tube secured with: Tape Dental Injury: Teeth and Oropharynx as per pre-operative assessment

## 2017-12-08 NOTE — Anesthesia Postprocedure Evaluation (Signed)
Anesthesia Post Note  Patient: Denise Watts  Procedure(s) Performed: BREAST LUMPECTOMY WITH SENTINEL LYMPH NODE BX (Right )  Patient location during evaluation: PACU Anesthesia Type: General Level of consciousness: awake and alert Pain management: pain level controlled Vital Signs Assessment: post-procedure vital signs reviewed and stable Respiratory status: spontaneous breathing, nonlabored ventilation, respiratory function stable and patient connected to nasal cannula oxygen Cardiovascular status: blood pressure returned to baseline and stable Postop Assessment: no apparent nausea or vomiting Anesthetic complications: no     Last Vitals:  Vitals:   12/08/17 1237 12/08/17 1245  BP: (!) 155/94   Pulse: 82 77  Resp: 18 18  Temp:    SpO2: 100% 97%    Last Pain:  Vitals:   12/08/17 1245  TempSrc:   PainSc: 7                  Alontae Chaloux S

## 2017-12-08 NOTE — H&P (Signed)
No change in clinical history or exam. For wide excision, assess for SLN biopsy.

## 2017-12-08 NOTE — Transfer of Care (Signed)
Immediate Anesthesia Transfer of Care Note  Patient: Denise Watts  Procedure(s) Performed: Procedure(s): BREAST LUMPECTOMY WITH SENTINEL LYMPH NODE BX (Right)  Patient Location: PACU  Anesthesia Type:General  Level of Consciousness: sedated  Airway & Oxygen Therapy: Patient Spontanous Breathing and Patient connected to face mask oxygen  Post-op Assessment: Report given to RN and Post -op Vital signs reviewed and stable  Post vital signs: Reviewed and stable  Last Vitals:  Vitals:   12/08/17 1027 12/08/17 1207  BP: (!) 144/70 136/83  Pulse: 62 87  Resp: 18 11  Temp: (!) 36.3 C (!) 36.3 C  SpO2: 709% 643%    Complications: No apparent anesthesia complications

## 2017-12-08 NOTE — Op Note (Signed)
Preoperative diagnosis: Invasive mammary carcinoma of the right breast.  Postoperative diagnosis: Same.  Operative procedure: Wide local excision right lower outer quadrant tumor.  Operating Surgeon: Hervey Ard, MD.  Anesthesia: General by LMA, Marcaine 0.5% with 1-200,000 as of epinephrine, 30 cc local infiltration.  Clinical note: This 62 year old woman is now 20+ years status post management of a right breast malignancy with wide excision and whole breast radiation including full axillary dissection.  She is developed a new lesion in the lower outer quadrant of the right breast.  She desired breast conservation.  Plans for repeat excision, accelerated partial breast radiation have been discussed in detail with the patient and radiation oncology.  She underwent injection with technetium sulfur colloid this morning with the idea that if the sentinel node was identified axillary exploration will be undertaken.  The patient received preoperative antibiotics based on her previous radiation.  Operative note: The patient underwent general anesthesia and tolerated this well.  ChloraPrep was applied to the skin.  Using the node seeker device there was no evidence of increased uptake in the axilla or over the internal mammary chain.  Ultrasound was used to identify the lesion in the lower outer quadrant of the right breast.  It was elected due to its superficial location to excised an ellipse of skin that included a portion of the areola.  The ellipse extended to the base of the nipple.  This was completed after infiltration with local anesthetic.  The lesion was close to the medial border but it was elected not to resect additional tissue as there was no residual tissue outside of adipose tissue at this location.  The breast was elevated off the underlying pectoralis muscle circumferentially.  This was then approximated with interrupted 2-0 Vicryl figure-of-eight sutures.  The deep adipose layer was  then mobilized to the medial aspect with 2 layers of interrupted 2-0 Vicryl sutures.    3 medium hemoclips were placed in a linear fashion to outline the location of the original tumor in the breast during closure of the mid adipose layer.    The skin of the areola was approximated with interrupted 4-0 Vicryl sub-particular sutures.  The remaining incision was closed with a running 4-0 Vicryl subarticular suture.  Benzoin Steri-Strips were applied followed by Telfa, fluff gauze and a compressive wrap.  The patient tolerated the procedure well was taken to recovery room in stable condition.

## 2017-12-08 NOTE — Discharge Instructions (Signed)

## 2017-12-10 ENCOUNTER — Encounter: Payer: Self-pay | Admitting: General Surgery

## 2017-12-14 ENCOUNTER — Telehealth: Payer: Self-pay | Admitting: General Surgery

## 2017-12-14 ENCOUNTER — Inpatient Hospital Stay: Payer: PRIVATE HEALTH INSURANCE | Attending: Oncology | Admitting: Oncology

## 2017-12-14 ENCOUNTER — Encounter: Payer: Self-pay | Admitting: Oncology

## 2017-12-14 VITALS — BP 138/86 | HR 86 | Temp 98.1°F | Resp 20 | Ht 61.0 in | Wt 139.9 lb

## 2017-12-14 DIAGNOSIS — E538 Deficiency of other specified B group vitamins: Secondary | ICD-10-CM | POA: Insufficient documentation

## 2017-12-14 DIAGNOSIS — Z853 Personal history of malignant neoplasm of breast: Secondary | ICD-10-CM | POA: Diagnosis not present

## 2017-12-14 DIAGNOSIS — Z923 Personal history of irradiation: Secondary | ICD-10-CM | POA: Insufficient documentation

## 2017-12-14 DIAGNOSIS — C7411 Malignant neoplasm of medulla of right adrenal gland: Secondary | ICD-10-CM | POA: Diagnosis not present

## 2017-12-14 DIAGNOSIS — Z8521 Personal history of malignant neoplasm of larynx: Secondary | ICD-10-CM | POA: Diagnosis not present

## 2017-12-14 DIAGNOSIS — I2510 Atherosclerotic heart disease of native coronary artery without angina pectoris: Secondary | ICD-10-CM | POA: Diagnosis not present

## 2017-12-14 DIAGNOSIS — Z87891 Personal history of nicotine dependence: Secondary | ICD-10-CM | POA: Diagnosis not present

## 2017-12-14 DIAGNOSIS — R918 Other nonspecific abnormal finding of lung field: Secondary | ICD-10-CM | POA: Diagnosis not present

## 2017-12-14 DIAGNOSIS — R5383 Other fatigue: Secondary | ICD-10-CM | POA: Diagnosis not present

## 2017-12-14 DIAGNOSIS — Z79899 Other long term (current) drug therapy: Secondary | ICD-10-CM | POA: Diagnosis not present

## 2017-12-14 DIAGNOSIS — K219 Gastro-esophageal reflux disease without esophagitis: Secondary | ICD-10-CM | POA: Diagnosis not present

## 2017-12-14 DIAGNOSIS — Z171 Estrogen receptor negative status [ER-]: Secondary | ICD-10-CM | POA: Insufficient documentation

## 2017-12-14 DIAGNOSIS — R5381 Other malaise: Secondary | ICD-10-CM | POA: Insufficient documentation

## 2017-12-14 DIAGNOSIS — Z85828 Personal history of other malignant neoplasm of skin: Secondary | ICD-10-CM | POA: Insufficient documentation

## 2017-12-14 DIAGNOSIS — C50511 Malignant neoplasm of lower-outer quadrant of right female breast: Secondary | ICD-10-CM

## 2017-12-14 DIAGNOSIS — C7491 Malignant neoplasm of unspecified part of right adrenal gland: Secondary | ICD-10-CM | POA: Diagnosis not present

## 2017-12-14 DIAGNOSIS — K760 Fatty (change of) liver, not elsewhere classified: Secondary | ICD-10-CM | POA: Insufficient documentation

## 2017-12-14 NOTE — Telephone Encounter (Signed)
The patient was notified that the wide excision did show negative margins.  The patient had met with medical oncology earlier today, and there is some discussion of whether she should take chemotherapy due to the similarity between the adrenal lesion removed last year and the small T1 a breast cancer remove this year from the right breast.  Still sore, strength is yet to fully return.  She will follow-up next week as scheduled.  Plans are for accelerated external beam radiation to the site in the future.

## 2017-12-14 NOTE — Progress Notes (Signed)
Sore from surgery on right side, tired, and sob on exertion.

## 2017-12-15 ENCOUNTER — Encounter: Payer: Self-pay | Admitting: General Surgery

## 2017-12-15 NOTE — Progress Notes (Signed)
Hematology/Oncology Consult note [facility]  Telephone:([phone] [phone]  Patient Care Team: Chrismon, Vickki Muff, PA as PCP - General (Physician Assistant)   Name of the patient: Denise Watts  [id number]  Feb 10, 1956   Date of visit: 12/15/17  Diagnosis- 1. H/o breast cancer in 1997 2. H/o stage I SCC of vocal cord in 2015. 3. Lung nodules4. Adrenal carcinoma of unknown primary s/p resection  Chief complaint/ Reason for visit- discuss lumpectomy results  Heme/Onc history:  Oncology History   1. Carcinoma of breast, T1, N0, M0 tumor diagnosis.  In January of 1997 2. Carcinoma of the vocal cord T1, N0, M0 tumor.  Status postradiation therapy 3. Abnormal CT scan of the chest (December, 2015) 4. Repeat CT scan of chest shows stable nodule (July, 2016) 5. Neutropenia with normal hemoglobin and normal platelet count (July, 2016)     Cancer of vocal cord [facility])   11/10/2014 Initial Diagnosis    Cancer of vocal cord    The patient was originally diagnosed with right breast cancer in 1997, status post surgery and radiation treatment only. She never received chemotherapy or hormone therapy. For her vocal cord cancer, she had extensive surgery and radiation therapy in 1998. Due to past history of smoking, she was also noted to have lung nodules. She is undergoing active surveillance imaging study of the chest.  Patient noted to have adrenal mass on CT chest which prompted PET CT in July 2018 which showed:IMPRESSION: 1. Right adrenal mass with peripheral hypermetabolism. Given interval development since 11/18/2015, suspicious for either isolated metastasis or a primary adrenal neoplasm. This should be considered for biopsy. 2. No evidence of hypermetabolic primary malignancy or extraadrenal metastasis. Pulmonary nodules are not significantly hypermetabolic. 3. Coronary artery atherosclerosis. Aortic Atherosclerosis (ICD10-I70.0). 4. Hepatic  steatosis. 5. Left sixth rib hypermetabolism is favored to be related to remote Trauma.  Patient underwent right adrenalectomy which showed:DIAGNOSIS:  A. ADRENAL GLAND, RIGHT; ADRENALECTOMY:  - HIGH GRADE CARCINOMA WITH EXTENSIVE NECROSIS.  - MARGINS OF THE SPECIMEN ARE NEGATIVE FOR CARCINOMA.   Comment:  Sections demonstrate abundant necrosis confined to the adrenal medullary  compartment. In one block of tissue a small fragment of viable malignant  neoplasm was identified. A panel of immunohistochemical stains was  performed with the following pattern of immunoreactivity:  Super pancytokeratin: Positive  GATA-3: Negative  TTF-1: Negative  P40: Negative  Napsin: Negative  PAX-8: Negative  Chromogranin: Negative   Repeat CT chest abdomen and pelvis in July 2019 showed stable left lower lobe pulmonary nodule.  No other evidence of metastatic disease.  New 4.5 mm nodule in the right breast.  This was followed by ultrasound mammogram and core biopsy of the breast lesion which was a grade 3 triple negative breast cancer.  Patient was seen by Dr. Bary Castilla and underwent lumpectomy on 12/08/2017.  Interval history-patient continues to have significant pain in the region of the right lumpectomy.  She is fatigued but denies other complaints  ECOG PS- 1 Pain scale- 5 Opioid associated constipation- no  Review of systems- Review of Systems  Constitutional: Positive for malaise/fatigue. Negative for chills, fever and weight loss.  HENT: Negative for congestion, ear discharge and nosebleeds.   Eyes: Negative for blurred vision.  Respiratory: Negative for cough, hemoptysis, sputum production, shortness of breath and wheezing.   Cardiovascular: Negative for chest pain, palpitations, orthopnea and claudication.  Gastrointestinal: Negative for abdominal pain, blood in stool, constipation, diarrhea, heartburn, melena, nausea and vomiting.  Genitourinary: Negative for dysuria, flank pain,  frequency, hematuria and urgency.  Musculoskeletal: Negative for back pain, joint pain and myalgias.  Skin: Negative for rash.  Neurological: Negative for dizziness, tingling, focal weakness, seizures, weakness and headaches.  Endo/Heme/Allergies: Does not bruise/bleed easily.  Psychiatric/Behavioral: Negative for depression and suicidal ideas. The patient does not have insomnia.      No Known Allergies   Past Medical History:  Diagnosis Date  . Arthritis    KNEE RIGHT  . Breast cancer [facility]) 1997   right breast lumpectomy with radiation  . Breast cancer ([place]) 11/15/2017   INVASIVE MAMMARY CARCINOMA/ triple negative  . 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[place]) 11/10/2014  . Cough 03/02/2017   INTERMTTENT DRY COUGH  . GERD (gastroesophageal reflux disease)   . History of hiatal hernia   . Personal history of radiation therapy   . Skin cancer   . Throat cancer [facility]) 1998   radiation     Past Surgical History:  Procedure Laterality Date  . BREAST BIOPSY Right 1997   positive  . BREAST BIOPSY Right 11/15/2017   INVASIVE MAMMARY CARCINOMA triple negative  . BREAST LUMPECTOMY Right 1997  . BREAST LUMPECTOMY WITH SENTINEL LYMPH NODE BIOPSY Right 12/08/2017   Procedure: BREAST LUMPECTOMY WITH SENTINEL LYMPH NODE BX;  Surgeon: Robert Bellow, MD;  Location: [facility] ORS;  Service: General;  Laterality: Right;  . COLONOSCOPY  2015  . ESOPHAGUS SURGERY    . KNEE SURGERY Right   . ROBOTIC ADRENALECTOMY Right 12/28/2016   Procedure: ROBOTIC ADRENALECTOMY;  Surgeon: Hollice Espy, MD;  Location: [facility] ORS;  Service: Urology;  Laterality: Right;  . SKIN CANCER EXCISION    . THROAT SURGERY  1998   throat cancer   . TUBAL LIGATION    . VENTRAL HERNIA REPAIR N/A 03/03/2017   Procedure: HERNIA REPAIR VENTRAL ADULT;  Surgeon: Clayburn Pert, MD;  Location: [facility] ORS;  Service: General;  Laterality: N/A;    Social History   Socioeconomic History  . Marital status: Married    Spouse name: Not on file   . Number of children: Not on file  . Years of education: Not on file  . Highest education level: Not on file  Occupational History  . Not on file  Social Needs  . Financial resource strain: Not on file  . Food insecurity:    Worry: Not on file    Inability: Not on file  . Transportation needs:    Medical: Not on file    Non-medical: Not on file  Tobacco Use  . Smoking status: Former Smoker    Packs/day: 1.50    Years: 30.00    Pack years: 45.00    Types: Cigarettes    Last attempt to quit: 05/02/1996    Years since quitting: 21.6  . Smokeless tobacco: Never Used  Substance and Sexual Activity  . Alcohol use: Yes    Alcohol/week: 0.0 standard drinks    Comment: OCC  . Drug use: No  . Sexual activity: Not on file  Lifestyle  . Physical activity:    Days per week: Not on file    Minutes per session: Not on file  . Stress: Not on file  Relationships  . Social connections:    Talks on phone: Not on file    Gets together: Not on file    Attends religious service: Not on file    Active member of club or organization: Not on file    Attends meetings of  clubs or organizations: Not on file    Relationship status: Not on file  . Intimate partner violence:    Fear of current or ex partner: Not on file    Emotionally abused: Not on file    Physically abused: Not on file    Forced sexual activity: Not on file  Other Topics Concern  . Not on file  Social History Narrative  . Not on file    Family History  Problem Relation Age of Onset  . Diabetes Mother   . Heart attack Mother   . Lung cancer Sister   . Bone cancer Sister   . Breast cancer Maternal Aunt 41  . Prostate cancer Neg Hx   . Kidney cancer Neg Hx   . Bladder Cancer Neg Hx      Current Outpatient Medications:  .  HYDROcodone-acetaminophen (NORCO/VICODIN) 5-325 MG tablet, Take 1 tablet by mouth every 4 (four) hours as needed for moderate pain., Disp: 20 tablet, Rfl: 0 .  lidocaine-prilocaine (EMLA) cream,  Apply 1 application topically as needed. Apply to the right areola and cover with plastic wrap one hour prior to leaving for surgery., Disp: 5 g, Rfl: 0 .  naproxen sodium (ALEVE) 220 MG tablet, Take 440 mg by mouth daily as needed (pain)., Disp: , Rfl:  .  omeprazole (PRILOSEC) 40 MG capsule, TAKE 1 CAPSULE BY MOUTH EVERY DAY-AM, Disp: 30 capsule, Rfl: 11  Physical exam:  Vitals:   12/14/17 1053 12/14/17 1106  BP:  138/86  Pulse: 86   Resp: 20   Temp: 98.1 F (36.7 C)   TempSrc: Tympanic   Weight: 139 lb 14.4 oz (63.5 kg)   Height: 5\' 1"  (1.549 m)    Physical Exam  Constitutional: She is oriented to person, place, and time. She appears well-developed and well-nourished.  HENT:  Head: Normocephalic and atraumatic.  Eyes: Pupils are equal, round, and reactive to light. EOM are normal.  Neck: Normal range of motion.  Cardiovascular: Normal rate, regular rhythm and normal heart sounds.  Pulmonary/Chest: Effort normal and breath sounds normal.  Abdominal: Soft. Bowel sounds are normal.  Neurological: She is alert and oriented to person, place, and time.  Skin: Skin is warm and dry.  Lumpectomy site is well opposed.  No overt signs of infection  CMP Latest Ref Rng & Units 11/07/2017  Glucose 70 - 99 mg/dL 96  BUN 8 - 23 mg/dL 12  Creatinine 0.44 - 1.00 mg/dL 0.73  Sodium 135 - 145 mmol/L 139  Potassium 3.5 - 5.1 mmol/L 4.2  Chloride 98 - 111 mmol/L 105  CO2 22 - 32 mmol/L 25  Calcium 8.9 - 10.3 mg/dL 9.0  Total Protein 6.5 - 8.1 g/dL 6.6  Total Bilirubin 0.3 - 1.2 mg/dL 0.6  Alkaline Phos 38 - 126 U/L 60  AST 15 - 41 U/L 23  ALT 0 - 44 U/L 15   CBC Latest Ref Rng & Units 11/07/2017  WBC 3.6 - 11.0 K/uL 4.5  Hemoglobin 12.0 - 16.0 g/dL 14.0  Hematocrit 35.0 - 47.0 % 40.9  Platelets 150 - 440 K/uL 194    No images are attached to the encounter.  Nm Sentinel Node Injection  Result Date: 12/08/2017 CLINICAL DATA:  Right breast cancer. EXAM: NUCLEAR MEDICINE BREAST  LYMPHOSCINTIGRAPHY TECHNIQUE: Intradermal injection of radiopharmaceutical was performed at the 12 o'clock, 3 o'clock, 6 o'clock, and 9 o'clock positions around the right nipple. The patient was then sent to the operating room where the sentinel  node(s) were identified and removed by the surgeon. RADIOPHARMACEUTICALS:  Total of 0.86 mCi Millipore-filtered Technetium-72m sulfur colloid, injected in four aliquots. IMPRESSION: Uncomplicated intradermal injection of Technetium-34m sulfur colloid for purposes of sentinel node identification. Electronically Signed   By: Markus Daft M.D.   On: 12/08/2017 10:55   US Breast Complete Uni Right Inc Axilla  Result Date: 11/24/2017 Ultrasound examination of the right breast at the 8 o'clock position, 4 cm from the nipple showed a 0.35 x 0.43 x 0.48 cm hypoechoic nodule, irregular with focal posterior acoustic shadowing.  Biopsy clip evident.  BI-RADS-6.      Assessment and plan- Patient is a 62 y.o. 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  Initial biopsy showed triple negative breast cancer and given that her tumor size was close to 5 mm, I would recommend adjuvant chemotherapy.  Although patient received radiation treatment to her right breast for her breast cancer in 1997 she has been seen by Dr. Donella Stade and will be getting 10 fractions of local radiation to her right breast.  I will see her back in 1 week's time to discuss the results of the final pathology and talk about chemotherapy in detail.  I will also discuss her case at the breast tumor conference given her history of multiple malignancies in the past.  Patient had not  responded to telephone calls for genetic counseling and I will make an appointment for an person genetic counselor next month given multiple history of malignancies.   Visit Diagnosis 1. Malignant neoplasm of lower-outer quadrant of right breast of female, estrogen receptor negative ([place])      Dr. Randa Evens, MD, MPH [facility] at [facility] [phone] 12/15/2017 12:25 PM

## 2017-12-18 ENCOUNTER — Other Ambulatory Visit: Payer: Self-pay | Admitting: *Deleted

## 2017-12-18 DIAGNOSIS — Z171 Estrogen receptor negative status [ER-]: Principal | ICD-10-CM

## 2017-12-18 DIAGNOSIS — C50511 Malignant neoplasm of lower-outer quadrant of right female breast: Secondary | ICD-10-CM

## 2017-12-18 LAB — SURGICAL PATHOLOGY

## 2017-12-19 ENCOUNTER — Encounter: Payer: Self-pay | Admitting: General Surgery

## 2017-12-19 ENCOUNTER — Ambulatory Visit (INDEPENDENT_AMBULATORY_CARE_PROVIDER_SITE_OTHER): Payer: No Typology Code available for payment source | Admitting: General Surgery

## 2017-12-19 VITALS — BP 134/82 | HR 68 | Resp 20 | Ht 61.0 in | Wt 139.0 lb

## 2017-12-19 DIAGNOSIS — C50511 Malignant neoplasm of lower-outer quadrant of right female breast: Secondary | ICD-10-CM

## 2017-12-19 DIAGNOSIS — Z171 Estrogen receptor negative status [ER-]: Secondary | ICD-10-CM

## 2017-12-19 NOTE — Patient Instructions (Addendum)
  Appointment with Dr Baruch Gouty and Dr Janese Banks as planned. The patient is aware to call back for any questions or new concerns.  The patient is aware to use a heating pad as needed for comfort. May continue to wear her bra for support.

## 2017-12-19 NOTE — Progress Notes (Signed)
Patient ID: Denise Watts, female   DOB: [birth year], 62 y.o.   MRN: [id number]  Chief Complaint  Patient presents with  . Routine Post Op    HPI Denise Watts is a 62 y.o. female here today for her post op right breast lumpectomy done on 12/08/2017. Patient states she is doing well, still hurting some and weak. She is here with her daughter and husband.   HPI  Past Medical History:  Diagnosis Date  . Arthritis    KNEE RIGHT  . Breast cancer [facility]) 1997   right breast lumpectomy with radiation  . Breast cancer ([place]) 11/15/2017   INVASIVE MAMMARY CARCINOMA/ triple negative  . 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[place]) 11/10/2014  . Cough 03/02/2017   INTERMTTENT DRY COUGH  . GERD (gastroesophageal reflux disease)   . History of hiatal hernia   . Personal history of radiation therapy   . Skin cancer   . Throat cancer [facility]) 1998   radiation    Past Surgical History:  Procedure Laterality Date  . BREAST BIOPSY Right 1997   positive  . BREAST BIOPSY Right 11/15/2017   INVASIVE MAMMARY CARCINOMA triple negative  . BREAST LUMPECTOMY Right 1997  . BREAST LUMPECTOMY WITH SENTINEL LYMPH NODE BIOPSY Right 12/08/2017   Procedure: BREAST LUMPECTOMY WITH SENTINEL LYMPH NODE BX;  Surgeon: Robert Bellow, MD;  Location: [facility] ORS;  Service: General;  Laterality: Right;  . COLONOSCOPY  2015  . ESOPHAGUS SURGERY    . KNEE SURGERY Right   . ROBOTIC ADRENALECTOMY Right 12/28/2016   Procedure: ROBOTIC ADRENALECTOMY;  Surgeon: Hollice Espy, MD;  Location: [facility] ORS;  Service: Urology;  Laterality: Right;  . SKIN CANCER EXCISION    . THROAT SURGERY  1998   throat cancer   . TUBAL LIGATION    . VENTRAL HERNIA REPAIR N/A 03/03/2017   Procedure: HERNIA REPAIR VENTRAL ADULT;  Surgeon: Clayburn Pert, MD;  Location: [facility] ORS;  Service: General;  Laterality: N/A;    Family History  Problem Relation Age of Onset  . Diabetes Mother   . Heart attack Mother   . Lung cancer Sister   . Bone cancer Sister   .  Breast cancer Maternal Aunt 41  . Prostate cancer Neg Hx   . Kidney cancer Neg Hx   . Bladder Cancer Neg Hx     Social History Social History   Tobacco Use  . Smoking status: Former Smoker    Packs/day: 1.50    Years: 30.00    Pack years: 45.00    Types: Cigarettes    Last attempt to quit: 05/02/1996    Years since quitting: 21.6  . Smokeless tobacco: Never Used  Substance Use Topics  . Alcohol use: Yes    Alcohol/week: 0.0 standard drinks    Comment: OCC  . Drug use: No    No Known Allergies  Current Outpatient Medications  Medication Sig Dispense Refill  . HYDROcodone-acetaminophen (NORCO/VICODIN) 5-325 MG tablet Take 1 tablet by mouth every 4 (four) hours as needed for moderate pain. (Patient taking differently: Take 0.5 tablets by mouth every 4 (four) hours as needed for moderate pain. ) 20 tablet 0  . naproxen sodium (ALEVE) 220 MG tablet Take 440 mg by mouth daily as needed (pain).    Marland Kitchen omeprazole (PRILOSEC) 40 MG capsule TAKE 1 CAPSULE BY MOUTH EVERY DAY-AM 30 capsule 11  . lidocaine-prilocaine (EMLA) cream Apply 1 application topically as needed. Apply to the right areola and cover with plastic  wrap one hour prior to leaving for surgery. (Patient not taking: Reported on 12/19/2017) 5 g 0   No current facility-administered medications for this visit.     Review of Systems Review of Systems  Constitutional: Negative.   Respiratory: Negative.   Cardiovascular: Negative.     Blood pressure 134/82, pulse 68, resp. rate 20, height _0  (1.549 m), weight 139 lb (63 kg), SpO2 98 %.  Physical Exam Physical Exam  Constitutional: She is oriented to person, place, and time. She appears well-developed and well-nourished.  Pulmonary/Chest:  Incision clean right breast     Neurological: She is alert and oriented to person, place, and time.  Skin: Skin is warm and dry.  Psychiatric: Her behavior is normal.    Data Reviewed DIAGNOSIS:  A. BREAST, RIGHT LOWER OUTER  QUADRANT; LUMPECTOMY:  - HIGH GRADE CARCINOMA WITH IMMUNOPHENOTYPE CONSISTENT WITH MAMMARY  ORIGIN.  - SEE CANCER SUMMARY BELOW.  - FLAT EPITHELIAL ATYPIA.  - BIOPSY SITE CHANGE.   CANCER CASE SUMMARY: INVASIVE CARCINOMA OF THE BREAST  Procedure: Lumpectomy  Specimen Laterality: Right  Tumor Size: 5 mm  Histologic Type: Invasive mammary carcinoma no special type  Histologic Grade (Nottingham Histologic Score)            Glandular (Acinar)/Tubular Differentiation: 3            Nuclear Pleomorphism: 3            Mitotic Rate: 3            Overall Grade: 3  Ductal Carcinoma In Situ (DCIS): Not identified  Margins:            Invasive Carcinoma Margins: Uninvolved by invasive  carcinoma                 Distance from closest margin: 1 mm  BREAST BIOMARKER TESTS  Estrogen Receptor (ER) Status: POSITIVE  Percentage of cells with nuclear positivity: 1-10%  Average intensity of staining: Weak   Progesterone Receptor (PgR) Status: NEGATIVE (less than 1%)  Internal control cells present in stain as expected   HER2 (by immunohistochemistry): NEGATIVE (0)    Assessment    Doing well post excision of recurrent right breast cancer.  Plans for external beam accelerated partial breast radiation.    Plan    Appointment with Dr Baruch Gouty and Dr Janese Banks as planned. The patient is aware to use a heating pad as needed for comfort. May continue to wear her bra for support. The patient is aware to call back for any questions or new concerns.  If adjuvant chemotherapy is required, the patient may require PowerPort placement.  The procedure was briefly reviewed today pending her follow-up discussion with medical oncology. Follow up in one month.     HPI, Physical Exam, Assessment and Plan have been scribed under the direction and in the presence of Robert Bellow, MD. Denise Fetch, RN  I have completed the exam and  reviewed the above documentation for accuracy and completeness.  I agree with the above.  Haematologist has been used and any errors in dictation or transcription are unintentional.  Hervey Ard, M.D., F.A.C.S.  Denise Watts 12/19/2017, 9:02 PM

## 2017-12-19 NOTE — Progress Notes (Signed)
I will discuss her case at tumor board with others. She is behaving like triple negative weeklyer positive grade 3. I am more concerned about morphologically similar to adrenal lesion and leaning towards chemo regardless.

## 2017-12-22 ENCOUNTER — Encounter: Payer: Self-pay | Admitting: Radiation Oncology

## 2017-12-22 ENCOUNTER — Ambulatory Visit
Admission: RE | Admit: 2017-12-22 | Discharge: 2017-12-22 | Disposition: A | Discharge status: Home / Self Care | Payer: PRIVATE HEALTH INSURANCE | Source: Ambulatory Visit | Attending: Radiation Oncology | Admitting: Radiation Oncology

## 2017-12-22 ENCOUNTER — Other Ambulatory Visit: Payer: Self-pay

## 2017-12-22 VITALS — BP 125/82 | HR 69 | Resp 16 | Wt 139.2 lb

## 2017-12-22 DIAGNOSIS — C50411 Malignant neoplasm of upper-outer quadrant of right female breast: Secondary | ICD-10-CM

## 2017-12-22 DIAGNOSIS — Z170 Estrogen receptor positive status [ER+]: Principal | ICD-10-CM

## 2017-12-22 NOTE — Progress Notes (Signed)
Radiation Oncology Follow up Note  Name: FARRELL BROERMAN   Date:   12/22/2017 MRN:  [id number] DOB: 1956/02/06    This 61 y.o. female presents to the clinic today for follow-up status post lumpectomy of her right breast for recurrent invasive mammary carcinoma the right breast treated with wide local excision radiation therapy back in 1997. She's also been treated for squamous cell carcinoma the larynx.Marland Kitchen  REFERRING PROVIDER: Margo Common, PA  HPI: patient is a 62 year old female previously treated back in 1997 to her right breast for invasive mammary carcinoma. She developed a recurrence.and I evaluated the patient for possibility of external beam accelerated partial breast irradiation. She went through reexcision on August 9 showing high-grade carcinoma with immunophenotype consistent with mammary origin.tumor was overall grade 3 margins were clear but close at 1 mm. Surgical clips were placed for image guided radiation therapy. She is doing well although healing slowly with some consistent pain in the right breast.  COMPLICATIONS OF TREATMENT: none  FOLLOW UP COMPLIANCE: keeps appointments   PHYSICAL EXAM:  BP 125/82 (BP Location: Left Arm, Patient Position: Sitting)   Pulse 69   Resp 16   Wt 139 lb 3.5 oz (63.2 kg)   BMI 26.31 kg/m  Right breast is status post recent wide local excision. Incision is healing well. No dominant mass or nodularity is noted in either breast in 2 positions examined no axillary or supraclavicular adenopathy is appreciated.Well-developed well-nourished patient in NAD. HEENT reveals PERLA, EOMI, discs not visualized.  Oral cavity is clear. No oral mucosal lesions are identified. Neck is clear without evidence of cervical or supraclavicular adenopathy. Lungs are clear to A&P. Cardiac examination is essentially unremarkable with regular rate and rhythm without murmur rub or thrill. Abdomen is benign with no organomegaly or masses noted. Motor sensory and DTR levels  are equal and symmetric in the upper and lower extremities. Cranial nerves II through XII are grossly intact. Proprioception is intact. No peripheral adenopathy or edema is identified. No motor or sensory levels are noted. Crude visual fields are within normal range.  RADIOLOGY RESULTS: no current films for review  PLAN: the present time I like to allow more healing.I have personally set him set her up and ordered T simulation to her right breast for partial breast irradiation using external beam treatment image guided technique. Would plan on delivering 3800 cGy in 10 fractions at 3.8 gray per fraction twice a day. Risks and benefits of treatment include a skin reaction fatigue alteration of blood counts possible higher risk of necrosis of the breast secondary prior radiation all were discussed in detail with the patient and her husband. They both seem to comprehend my treatment plan well. I have personally set up and ordered CT simulation for the end of next week to allow some more healing. Patient seems to comprehend my treatment plan well.here is a question of her adrenal lesion which wasmorphologically similar to her breast cancer and she is being planned for systemic chemotherapy which should not interfere with our short hypofractionated course of partial breast irradiation.  I would like to take this opportunity to thank you for allowing me to participate in the care of your patient.Noreene Filbert, MD

## 2017-12-26 ENCOUNTER — Encounter: Payer: Self-pay | Admitting: Oncology

## 2017-12-26 ENCOUNTER — Inpatient Hospital Stay (HOSPITAL_BASED_OUTPATIENT_CLINIC_OR_DEPARTMENT_OTHER): Payer: PRIVATE HEALTH INSURANCE | Admitting: Oncology

## 2017-12-26 VITALS — BP 135/76 | HR 84 | Temp 97.1°F | Resp 18 | Ht 61.0 in | Wt 139.7 lb

## 2017-12-26 DIAGNOSIS — Z87891 Personal history of nicotine dependence: Secondary | ICD-10-CM

## 2017-12-26 DIAGNOSIS — Z8521 Personal history of malignant neoplasm of larynx: Secondary | ICD-10-CM

## 2017-12-26 DIAGNOSIS — Z85828 Personal history of other malignant neoplasm of skin: Secondary | ICD-10-CM

## 2017-12-26 DIAGNOSIS — K760 Fatty (change of) liver, not elsewhere classified: Secondary | ICD-10-CM

## 2017-12-26 DIAGNOSIS — K219 Gastro-esophageal reflux disease without esophagitis: Secondary | ICD-10-CM

## 2017-12-26 DIAGNOSIS — R5381 Other malaise: Secondary | ICD-10-CM

## 2017-12-26 DIAGNOSIS — Z171 Estrogen receptor negative status [ER-]: Secondary | ICD-10-CM | POA: Diagnosis not present

## 2017-12-26 DIAGNOSIS — C50511 Malignant neoplasm of lower-outer quadrant of right female breast: Secondary | ICD-10-CM

## 2017-12-26 DIAGNOSIS — Z853 Personal history of malignant neoplasm of breast: Secondary | ICD-10-CM

## 2017-12-26 DIAGNOSIS — E538 Deficiency of other specified B group vitamins: Secondary | ICD-10-CM

## 2017-12-26 DIAGNOSIS — R5383 Other fatigue: Secondary | ICD-10-CM

## 2017-12-26 DIAGNOSIS — Z923 Personal history of irradiation: Secondary | ICD-10-CM

## 2017-12-26 DIAGNOSIS — C7411 Malignant neoplasm of medulla of right adrenal gland: Secondary | ICD-10-CM | POA: Diagnosis not present

## 2017-12-26 DIAGNOSIS — I2510 Atherosclerotic heart disease of native coronary artery without angina pectoris: Secondary | ICD-10-CM

## 2017-12-26 DIAGNOSIS — R918 Other nonspecific abnormal finding of lung field: Secondary | ICD-10-CM

## 2017-12-26 DIAGNOSIS — Z79899 Other long term (current) drug therapy: Secondary | ICD-10-CM

## 2017-12-27 ENCOUNTER — Inpatient Hospital Stay: Payer: PRIVATE HEALTH INSURANCE

## 2017-12-27 ENCOUNTER — Ambulatory Visit: Payer: PRIVATE HEALTH INSURANCE

## 2017-12-27 ENCOUNTER — Other Ambulatory Visit: Payer: Self-pay | Admitting: General Surgery

## 2017-12-27 DIAGNOSIS — C7411 Malignant neoplasm of medulla of right adrenal gland: Secondary | ICD-10-CM | POA: Diagnosis not present

## 2017-12-27 DIAGNOSIS — C50511 Malignant neoplasm of lower-outer quadrant of right female breast: Secondary | ICD-10-CM

## 2017-12-27 DIAGNOSIS — Z171 Estrogen receptor negative status [ER-]: Principal | ICD-10-CM

## 2017-12-27 LAB — COMPREHENSIVE METABOLIC PANEL
ALBUMIN: 3.9 g/dL (ref 3.5–5.0)
ALT: 14 U/L (ref 0–44)
AST: 25 U/L (ref 15–41)
Alkaline Phosphatase: 60 U/L (ref 38–126)
Anion gap: 8 (ref 5–15)
BUN: 10 mg/dL (ref 8–23)
CHLORIDE: 107 mmol/L (ref 98–111)
CO2: 29 mmol/L (ref 22–32)
Calcium: 9.4 mg/dL (ref 8.9–10.3)
Creatinine, Ser: 0.71 mg/dL (ref 0.44–1.00)
GFR calc Af Amer: >60 mL/min (ref >60)
GFR calc non Af Amer: >60 mL/min (ref >60)
GLUCOSE: 100 mg/dL — AB (ref 70–99)
Potassium: 4.4 mmol/L (ref 3.5–5.1)
Sodium: 144 mmol/L (ref 135–145)
Total Bilirubin: 0.7 mg/dL (ref 0.3–1.2)
Total Protein: 6.6 g/dL (ref 6.5–8.1)

## 2017-12-27 LAB — CBC WITH DIFFERENTIAL/PLATELET
BASOS ABS: 0 10*3/uL (ref 0–0.1)
Basophils Relative: 1 %
Eosinophils Absolute: 0.2 10*3/uL (ref 0–0.7)
Eosinophils Relative: 4 %
HEMATOCRIT: 43.1 % (ref 35.0–47.0)
Hemoglobin: 14.7 g/dL (ref 12.0–16.0)
LYMPHS ABS: 1.3 10*3/uL (ref 1.0–3.6)
LYMPHS PCT: 27 %
MCH: 33.7 pg (ref 26.0–34.0)
MCHC: 34 g/dL (ref 32.0–36.0)
MCV: 99.2 fL (ref 80.0–100.0)
Monocytes Absolute: 0.3 10*3/uL (ref 0.2–0.9)
Monocytes Relative: 7 %
NEUTROS ABS: 2.9 10*3/uL (ref 1.4–6.5)
Neutrophils Relative %: 61 %
Platelets: 228 10*3/uL (ref 150–440)
RBC: 4.34 MIL/uL (ref 3.80–5.20)
RDW: 12.1 % (ref 11.5–14.5)
WBC: 4.8 10*3/uL (ref 3.6–11.0)

## 2017-12-27 LAB — FOLATE: Folate: 26 ng/mL (ref >5.9)

## 2017-12-27 LAB — IRON AND TIBC
Iron: 116 ug/dL (ref 28–170)
Saturation Ratios: 38 % — ABNORMAL HIGH (ref 10.4–31.8)
TIBC: 305 ug/dL (ref 250–450)
UIBC: 189 ug/dL

## 2017-12-27 LAB — FERRITIN: Ferritin: 67 ng/mL (ref 11–307)

## 2017-12-27 LAB — CORTISOL: Cortisol, Plasma: 13.3 ug/dL

## 2017-12-27 LAB — TSH: TSH: 1.301 u[IU]/mL (ref 0.350–4.500)

## 2017-12-27 LAB — VITAMIN B12: VITAMIN B 12: 124 pg/mL — AB (ref 180–914)

## 2017-12-27 NOTE — Telephone Encounter (Signed)
I talked with the patient and she states she has noticed a red area going toward nipple that is warm for 3-4 days. She has been using a heating pad which made worse. The nipple is extremely tender. She has been Aleve as well. Patient needs an appointment tomorrow.

## 2017-12-27 NOTE — Telephone Encounter (Signed)
Patient is calling and asking to get a refill on her pain medication,  Patient said her pain level was at a 6 right now. Patient complains of still being a little weak. Please call patient and advise.

## 2017-12-28 ENCOUNTER — Ambulatory Visit: Payer: No Typology Code available for payment source

## 2017-12-28 ENCOUNTER — Encounter: Payer: Self-pay | Admitting: General Surgery

## 2017-12-28 ENCOUNTER — Ambulatory Visit: Payer: No Typology Code available for payment source | Admitting: General Surgery

## 2017-12-28 ENCOUNTER — Telehealth: Payer: Self-pay

## 2017-12-28 ENCOUNTER — Ambulatory Visit (INDEPENDENT_AMBULATORY_CARE_PROVIDER_SITE_OTHER): Payer: No Typology Code available for payment source

## 2017-12-28 VITALS — BP 120/66 | HR 74 | Temp 97.7°F | Ht 61.0 in | Wt 140.0 lb

## 2017-12-28 DIAGNOSIS — Z171 Estrogen receptor negative status [ER-]: Principal | ICD-10-CM

## 2017-12-28 DIAGNOSIS — E538 Deficiency of other specified B group vitamins: Secondary | ICD-10-CM | POA: Insufficient documentation

## 2017-12-28 DIAGNOSIS — C50511 Malignant neoplasm of lower-outer quadrant of right female breast: Secondary | ICD-10-CM

## 2017-12-28 MED ORDER — HYDROCODONE-ACETAMINOPHEN 5-325 MG PO TABS: 1.0000 | ORAL_TABLET | ORAL | 0 refills | Status: DC | PRN

## 2017-12-28 MED ORDER — LIDOCAINE-PRILOCAINE 2.5-2.5 % EX CREA: 1.0000 "application " | TOPICAL_CREAM | CUTANEOUS | 0 refills | Status: DC | PRN

## 2017-12-28 MED ORDER — GABAPENTIN 300 MG PO CAPS: 300.0000 mg | ORAL_CAPSULE | Freq: Three times a day (TID) | ORAL | 0 refills | Status: DC

## 2017-12-28 NOTE — Progress Notes (Signed)
Hematology/Oncology Consult note [facility]  [phone] [phone]  Patient Care Team: Chrismon, Vickki Muff, PA as PCP - General (Physician Assistant)   Name of the patient: Denise Watts  [id number]  1956/03/15   Date of visit: 12/28/17  Diagnosis- 1. . H/o breast cancer in 1997 2. H/o stage I SCC of vocal cord in 2015. 3. Lung nodules4. Adrenal carcinoma of unknown primary s/p resection 5. Stage IaT1a NxcM0 ER weakly positive 1-10%, PR negative and her 2 negative   Chief complaint/ Reason for visit- discuss final pathology results and further management  Heme/Onc history:  Oncology History   1. Carcinoma of breast, T1, N0, M0 tumor diagnosis.  In January of 1997 2. Carcinoma of the vocal cord T1, N0, M0 tumor.  Status postradiation therapy 3. Abnormal CT scan of the chest (December, 2015) 4. Repeat CT scan of chest shows stable nodule (July, 2016) 5. Neutropenia with normal hemoglobin and normal platelet count (July, 2016)     Cancer of vocal cord [facility])   11/10/2014 Initial Diagnosis    Cancer of vocal cord    The patient was originally diagnosed with right breast cancer in 1997, 10 mm grade 3, ER pr positive her 2 negative, node negative status post surgery and radiation treatment only. She never received chemotherapy or hormone therapy. For her vocal cord cancer, she had extensive surgery and radiation therapy in 1998. Due to past history of smoking, she was also noted to have lung nodules. She is undergoing active surveillance imaging study of the chest.  Patient noted to have adrenal mass on CT chest which prompted PET CT in July 2018 which showed:IMPRESSION: 1. Right adrenal mass with peripheral hypermetabolism. Given interval development since 11/18/2015, suspicious for either isolated metastasis or a primary adrenal neoplasm. This should be considered for biopsy. 2. No evidence of hypermetabolic primary malignancy or  extraadrenal metastasis. Pulmonary nodules are not significantly hypermetabolic. 3. Coronary artery atherosclerosis. Aortic Atherosclerosis (ICD10-I70.0). 4. Hepatic steatosis. 5. Left sixth rib hypermetabolism is favored to be related to remote Trauma.  Patient underwent right adrenalectomy which showed:DIAGNOSIS:  A. ADRENAL GLAND, RIGHT; ADRENALECTOMY:  - HIGH GRADE CARCINOMA WITH EXTENSIVE NECROSIS.  - MARGINS OF THE SPECIMEN ARE NEGATIVE FOR CARCINOMA.   Comment:  Sections demonstrate abundant necrosis confined to the adrenal medullary  compartment. In one block of tissue a small fragment of viable malignant  neoplasm was identified. A panel of immunohistochemical stains was  performed with the following pattern of immunoreactivity:  Super pancytokeratin: Positive  GATA-3: Negative  TTF-1: Negative  P40: Negative  Napsin: Negative  PAX-8: Negative  Chromogranin: Negative   Repeat CT chest abdomen and pelvis in July 2019 showed stable left lower lobe pulmonary nodule.  No other evidence of metastatic disease.  New 4.5 mm nodule in the right breast.  This was followed by ultrasound mammogram and core biopsy of the breast lesion which was a grade 3 triple negative breast cancer.  Patient was seen by Dr. Bary Castilla and underwent lumpectomy on 12/08/2017.  Interval history- she is still having post lumpectomy pain and will hopefully start RT next week. She reports feeling significantly fatigued since her adrenal surgery. Denies other complaints  ECOG PS- 1 Pain scale- 4 Opioid associated constipation- no  Review of systems- Review of Systems  Constitutional: Positive for malaise/fatigue. Negative for chills, fever and weight loss.  HENT: Negative for congestion, ear discharge and nosebleeds.   Eyes: Negative for blurred vision.  Respiratory:  Negative for cough, hemoptysis, sputum production, shortness of breath and wheezing.   Cardiovascular: Negative for chest pain,  palpitations, orthopnea and claudication.  Gastrointestinal: Negative for abdominal pain, blood in stool, constipation, diarrhea, heartburn, melena, nausea and vomiting.  Genitourinary: Negative for dysuria, flank pain, frequency, hematuria and urgency.  Musculoskeletal: Negative for back pain, joint pain and myalgias.  Skin: Negative for rash.  Neurological: Negative for dizziness, tingling, focal weakness, seizures, weakness and headaches.  Endo/Heme/Allergies: Does not bruise/bleed easily.  Psychiatric/Behavioral: Negative for depression and suicidal ideas. The patient does not have insomnia.    right breast pain   No Known Allergies   Past Medical History:  Diagnosis Date  . Arthritis    KNEE RIGHT  . Breast cancer [facility]) 1997   right breast lumpectomy with radiation  . Breast cancer ([place]) 11/15/2017   INVASIVE MAMMARY CARCINOMA/ triple negative  . 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[place]) 11/10/2014  . Cough 03/02/2017   INTERMTTENT DRY COUGH  . GERD (gastroesophageal reflux disease)   . History of hiatal hernia   . Personal history of radiation therapy   . Skin cancer   . Throat cancer [facility]) 1998   radiation     Past Surgical History:  Procedure Laterality Date  . BREAST BIOPSY Right 1997   positive  . BREAST BIOPSY Right 11/15/2017   INVASIVE MAMMARY CARCINOMA triple negative  . BREAST LUMPECTOMY Right 1997  . BREAST LUMPECTOMY WITH SENTINEL LYMPH NODE BIOPSY Right 12/08/2017   Procedure: BREAST LUMPECTOMY WITH SENTINEL LYMPH NODE BX;  Surgeon: Robert Bellow, MD;  Location: [facility] ORS;  Service: General;  Laterality: Right;  . COLONOSCOPY  2015  . ESOPHAGUS SURGERY    . KNEE SURGERY Right   . ROBOTIC ADRENALECTOMY Right 12/28/2016   Procedure: ROBOTIC ADRENALECTOMY;  Surgeon: Hollice Espy, MD;  Location: [facility] ORS;  Service: Urology;  Laterality: Right;  . SKIN CANCER EXCISION    . THROAT SURGERY  1998   throat cancer   . TUBAL LIGATION    . VENTRAL HERNIA REPAIR N/A  03/03/2017   Procedure: HERNIA REPAIR VENTRAL ADULT;  Surgeon: Clayburn Pert, MD;  Location: [facility] ORS;  Service: General;  Laterality: N/A;    Social History   Socioeconomic History  . Marital status: Married    Spouse name: Not on file  . Number of children: Not on file  . Years of education: Not on file  . Highest education level: Not on file  Occupational History  . Not on file  Social Needs  . Financial resource strain: Not on file  . Food insecurity:    Worry: Not on file    Inability: Not on file  . Transportation needs:    Medical: Not on file    Non-medical: Not on file  Tobacco Use  . Smoking status: Former Smoker    Packs/day: 1.50    Years: 30.00    Pack years: 45.00    Types: Cigarettes    Last attempt to quit: 05/02/1996    Years since quitting: 21.6  . Smokeless tobacco: Never Used  Substance and Sexual Activity  . Alcohol use: Yes    Alcohol/week: 0.0 standard drinks    Comment: OCC  . Drug use: No  . Sexual activity: Not on file  Lifestyle  . Physical activity:    Days per week: Not on file    Minutes per session: Not on file  . Stress: Not on file  Relationships  . Social connections:  Talks on phone: Not on file    Gets together: Not on file    Attends religious service: Not on file    Active member of club or organization: Not on file    Attends meetings of clubs or organizations: Not on file    Relationship status: Not on file  . Intimate partner violence:    Fear of current or ex partner: Not on file    Emotionally abused: Not on file    Physically abused: Not on file    Forced sexual activity: Not on file  Other Topics Concern  . Not on file  Social History Narrative  . Not on file    Family History  Problem Relation Age of Onset  . Diabetes Mother   . Heart attack Mother   . Lung cancer Sister   . Bone cancer Sister   . Breast cancer Maternal Aunt 41  . Prostate cancer Neg Hx   . Kidney cancer Neg Hx   . Bladder Cancer Neg  Hx      Current Outpatient Medications:  .  HYDROcodone-acetaminophen (NORCO/VICODIN) 5-325 MG tablet, Take 1 tablet by mouth every 4 (four) hours as needed for moderate pain. (Patient taking differently: Take 0.5 tablets by mouth every 4 (four) hours as needed for moderate pain. ), Disp: 20 tablet, Rfl: 0 .  omeprazole (PRILOSEC) 40 MG capsule, TAKE 1 CAPSULE BY MOUTH EVERY DAY-AM, Disp: 30 capsule, Rfl: 11 .  naproxen sodium (ALEVE) 220 MG tablet, Take 440 mg by mouth daily as needed (pain)., Disp: , Rfl:   Physical exam:  Vitals:   12/26/17 1426  BP: 135/76  Pulse: 84  Resp: 18  Temp: (!) 97.1 F (36.2 C)  TempSrc: Tympanic  SpO2: 98%  Weight: 139 lb 11.2 oz (63.4 kg)  Height: 5' 1"  (1.549 m)   Physical Exam  Constitutional: She is oriented to person, place, and time. She appears well-developed and well-nourished.  HENT:  Head: Normocephalic and atraumatic.  Eyes: Pupils are equal, round, and reactive to light. EOM are normal.  Neck: Normal range of motion.  Cardiovascular: Normal rate, regular rhythm and normal heart sounds.  Pulmonary/Chest: Effort normal and breath sounds normal.  Abdominal: Soft. Bowel sounds are normal.  Neurological: She is alert and oriented to person, place, and time.  Skin: Skin is warm and dry.     CMP Latest Ref Rng & Units 12/27/2017  Glucose 70 - 99 mg/dL 100(H)  BUN 8 - 23 mg/dL 10  Creatinine 0.44 - 1.00 mg/dL 0.71  Sodium 135 - 145 mmol/L 144  Potassium 3.5 - 5.1 mmol/L 4.4  Chloride 98 - 111 mmol/L 107  CO2 22 - 32 mmol/L 29  Calcium 8.9 - 10.3 mg/dL 9.4  Total Protein 6.5 - 8.1 g/dL 6.6  Total Bilirubin 0.3 - 1.2 mg/dL 0.7  Alkaline Phos 38 - 126 U/L 60  AST 15 - 41 U/L 25  ALT 0 - 44 U/L 14   CBC Latest Ref Rng & Units 12/27/2017  WBC 3.6 - 11.0 K/uL 4.8  Hemoglobin 12.0 - 16.0 g/dL 14.7  Hematocrit 35.0 - 47.0 % 43.1  Platelets 150 - 440 K/uL 228    No images are attached to the encounter.  Nm Sentinel Node  Injection  Result Date: 12/08/2017 CLINICAL DATA:  Right breast cancer. EXAM: NUCLEAR MEDICINE BREAST LYMPHOSCINTIGRAPHY TECHNIQUE: Intradermal injection of radiopharmaceutical was performed at the 12 o'clock, 3 o'clock, 6 o'clock, and 9 o'clock positions around the right nipple.  The patient was then sent to the operating room where the sentinel node(s) were identified and removed by the surgeon. RADIOPHARMACEUTICALS:  Total of 0.86 mCi Millipore-filtered Technetium-34msulfur colloid, injected in four aliquots. IMPRESSION: Uncomplicated intradermal injection of Technetium-926mulfur colloid for purposes of sentinel node identification. Electronically Signed   By: AdMarkus Daft.D.   On: 12/08/2017 10:55     Assessment and plan- Patient is a [age].o. 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 Final path showed 5 mm grade 3 Er weakly positive 1-10%, pr negative and her 2 negative tumor  I discussed the final pathology with the patient in detail. Core biopsy showed triple negative tumor but final pathology shows weakly ER positive tumor. I do not think this is releated to her prior breast cancer in 1997 and liekly represents second primary. What is puzzling is that this looks morphologically similar to the adrenal carcinoma that she had back in July 2018. However the tissue of origin could not be determined on the adrenal specimen given very small amount of viable tumor and extensive fibrosis surrounding it. It would be highly unusual to see met appear prior to breast primary which has showed up almost a year later  Patient is quite fatigued since her adrenal surgery. She would like to avoid chemotherapy if possible. For a 60m79mriple negative breast  cancer I would have offered adjuvant TC chemotherapy. However given that her tumor is weakly ER positive, I discussed the utility of oncotype testing. Discussed what the test entails and how the results are intrepreted for low, intermediate and high risk score. Since she is > 50 36ars of age she would not require adjuvant chemo if her scores is </= 25. We will send off the testing today and I will see her back in 2 weeks to discuss results of test and further management. She will complete her RT in the meanwhile  Fatigue- will check b12, folate, iron studies, TSH and morning cortisol.b12 levels did come back low at 124 the next day. I will plan to start her on weekly b12 X4 followed by monthly. Hold off on port placement until oncotype is back.   Total face to face encounter time for this patient visit was 45 min. >50% of the time was  spent in counseling and coordination of care.       Visit Diagnosis 1. Malignant neoplasm of lower-outer quadrant of right breast of female, estrogen receptor negative (HCCLarue 2. B12 deficiency      Dr. ArcRanda EvensD, MPH [id number] 8:49 AM

## 2017-12-28 NOTE — Patient Instructions (Addendum)
The patient is aware to call back for any questions or new concerns.  

## 2017-12-28 NOTE — Progress Notes (Signed)
Patient ID: Denise Watts, female   DOB: 21-Jul-1955, 62 y.o.   MRN: [id number]  Chief Complaint  Patient presents with  . Routine Post Op    breast pain and redness    HPI Denise Watts is a 62 y.o. female.  Patient has been having breast pain for 3 days, as well as reports of redness near the wound.  She reports extreme sensitivity to the area.. Patient requesting more pain medications. Sh states use of the the heating pad has made the area more uncomfortable. Patient underwent excision of an ellipse of skin with the underlying tumor, requiring some mobilization for closure and this post radiation breast.  Patient has also complained of marked sensitivity of the nipple, this may be aggravated by the excision site coming to the base of the nipple without undermining.  HPI  Past Medical History:  Diagnosis Date  . Arthritis    KNEE RIGHT  . Breast cancer [facility]) 1997   right breast lumpectomy with radiation  . Breast cancer ([place]) 11/15/2017   INVASIVE MAMMARY CARCINOMA/ triple negative  . 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[place]) 11/10/2014  . Cough 03/02/2017   INTERMTTENT DRY COUGH  . GERD (gastroesophageal reflux disease)   . History of hiatal hernia   . Personal history of radiation therapy   . Skin cancer   . Throat cancer [facility]) 1998   radiation    Past Surgical History:  Procedure Laterality Date  . BREAST BIOPSY Right 1997   positive  . BREAST BIOPSY Right 11/15/2017   INVASIVE MAMMARY CARCINOMA triple negative  . BREAST LUMPECTOMY Right 1997  . BREAST LUMPECTOMY WITH SENTINEL LYMPH NODE BIOPSY Right 12/08/2017   Procedure: BREAST LUMPECTOMY WITH SENTINEL LYMPH NODE BX;  Surgeon: Robert Bellow, MD;  Location: [facility] ORS;  Service: General;  Laterality: Right;  . COLONOSCOPY  2015  . ESOPHAGUS SURGERY    . KNEE SURGERY Right   . ROBOTIC ADRENALECTOMY Right 12/28/2016   Procedure: ROBOTIC ADRENALECTOMY;  Surgeon: Hollice Espy, MD;  Location: [facility] ORS;  Service: Urology;  Laterality:  Right;  . SKIN CANCER EXCISION    . THROAT SURGERY  1998   throat cancer   . TUBAL LIGATION    . VENTRAL HERNIA REPAIR N/A 03/03/2017   Procedure: HERNIA REPAIR VENTRAL ADULT;  Surgeon: Clayburn Pert, MD;  Location: [facility] ORS;  Service: General;  Laterality: N/A;    Family History  Problem Relation Age of Onset  . Diabetes Mother   . Heart attack Mother   . Lung cancer Sister   . Bone cancer Sister   . Breast cancer Maternal Aunt 41  . Prostate cancer Neg Hx   . Kidney cancer Neg Hx   . Bladder Cancer Neg Hx     Social History Social History   Tobacco Use  . Smoking status: Former Smoker    Packs/day: 1.50    Years: 30.00    Pack years: 45.00    Types: Cigarettes    Last attempt to quit: 05/02/1996    Years since quitting: 21.6  . Smokeless tobacco: Never Used  Substance Use Topics  . Alcohol use: Yes    Alcohol/week: 0.0 standard drinks    Comment: OCC  . Drug use: No    No Known Allergies  Current Outpatient Medications  Medication Sig Dispense Refill  . HYDROcodone-acetaminophen (NORCO/VICODIN) 5-325 MG tablet Take 1 tablet by mouth every 4 (four) hours as needed for moderate pain. (Patient taking differently: Take 0.5 tablets  by mouth every 4 (four) hours as needed for moderate pain. ) 20 tablet 0  . naproxen sodium (ALEVE) 220 MG tablet Take 440 mg by mouth daily as needed (pain).    Marland Kitchen omeprazole (PRILOSEC) 40 MG capsule TAKE 1 CAPSULE BY MOUTH EVERY DAY-AM 30 capsule 11  . gabapentin (NEURONTIN) 300 MG capsule Take 1 capsule (300 mg total) by mouth 3 (three) times daily. 90 capsule 0  . HYDROcodone-acetaminophen (NORCO/VICODIN) 5-325 MG tablet Take 1 tablet by mouth every 4 (four) hours as needed for moderate pain. 20 tablet 0  . lidocaine-prilocaine (EMLA) cream Apply 1 application topically as needed. 30 g 0   No current facility-administered medications for this visit.     Review of Systems Review of Systems  Blood pressure 120/66, pulse 74,  temperature 97.7 F (36.5 C), height 5\' 1"  (1.549 m), weight 140 lb (63.5 kg).  Physical Exam Physical Exam  Constitutional: She is oriented to person, place, and time. She appears well-developed and well-nourished.  Pulmonary/Chest: Right breast exhibits tenderness.    Neurological: She is alert and oriented to person, place, and time.  Skin: Skin is warm and dry.  Psychiatric: Her behavior is normal.    Data Reviewed Limited ultrasound without images was completed to determine if there was evidence of seroma.  No seroma was evident.  Assessment    Postoperative pain, this suggestive of nerve irritation more than incisional issues.  No evidence of infection.  Pain is somewhat out of proportion to physical findings.      Plan    Emla cream as needed for pain. Will make use of Neurontin 300 mg 3 times daily.  Limited refill of narcotics.  Pilar Plate discussion that narcotic use will be limited to present supply. Follow up as scheduled.  The patient was originally scheduled for simulation tomorrow.  This is been put on hold due to, computer issues at the cancer center.  This extra time may help relieve some of the inflammation and discomfort the patient is experiencing with the new medication regimen noted above.        HPI, Physical Exam, Assessment and Plan have been scribed under the direction and in the presence of Robert Bellow, MD. Karie Fetch, RN   Forest Gleason Byrnett 12/29/2017, 6:51 AM

## 2017-12-28 NOTE — Telephone Encounter (Signed)
Spoke with the patient to inform her that her B-12 levels are still running low and  would like to do B-12 injection weekly x 4 weeks then monthly. The patient was agreeable and understanding.

## 2017-12-28 NOTE — Telephone Encounter (Signed)
-----   Message from Sindy Guadeloupe, MD sent at 12/28/2017  8:28 AM EDT ----- b12 levels are very low. We need to give b12 weekly X4 followed by monthly. Please inform patient. I will put the treatment plan in. Thanks, Astrid Divine

## 2017-12-29 ENCOUNTER — Ambulatory Visit
Admission: RE | Admit: 2017-12-29 | Discharge: 2017-12-29 | Disposition: A | Discharge status: Home / Self Care | Payer: No Typology Code available for payment source | Source: Ambulatory Visit | Attending: Radiation Oncology | Admitting: Radiation Oncology

## 2017-12-29 ENCOUNTER — Inpatient Hospital Stay: Payer: PRIVATE HEALTH INSURANCE

## 2017-12-29 DIAGNOSIS — C7411 Malignant neoplasm of medulla of right adrenal gland: Secondary | ICD-10-CM | POA: Diagnosis not present

## 2017-12-29 DIAGNOSIS — E538 Deficiency of other specified B group vitamins: Secondary | ICD-10-CM

## 2017-12-29 DIAGNOSIS — C50511 Malignant neoplasm of lower-outer quadrant of right female breast: Secondary | ICD-10-CM | POA: Insufficient documentation

## 2017-12-29 DIAGNOSIS — Z171 Estrogen receptor negative status [ER-]: Secondary | ICD-10-CM | POA: Diagnosis not present

## 2017-12-29 MED ORDER — CYANOCOBALAMIN 1000 MCG/ML IJ SOLN
1000.0000 ug | INTRAMUSCULAR | Status: DC
  Administered 2017-12-29: 1000 ug via INTRAMUSCULAR

## 2018-01-05 ENCOUNTER — Inpatient Hospital Stay: Payer: PRIVATE HEALTH INSURANCE | Attending: Oncology

## 2018-01-05 DIAGNOSIS — R918 Other nonspecific abnormal finding of lung field: Secondary | ICD-10-CM | POA: Insufficient documentation

## 2018-01-05 DIAGNOSIS — I2510 Atherosclerotic heart disease of native coronary artery without angina pectoris: Secondary | ICD-10-CM | POA: Insufficient documentation

## 2018-01-05 DIAGNOSIS — Z79899 Other long term (current) drug therapy: Secondary | ICD-10-CM | POA: Insufficient documentation

## 2018-01-05 DIAGNOSIS — R300 Dysuria: Secondary | ICD-10-CM | POA: Insufficient documentation

## 2018-01-05 DIAGNOSIS — Z87891 Personal history of nicotine dependence: Secondary | ICD-10-CM | POA: Insufficient documentation

## 2018-01-05 DIAGNOSIS — R5383 Other fatigue: Secondary | ICD-10-CM | POA: Insufficient documentation

## 2018-01-05 DIAGNOSIS — C50511 Malignant neoplasm of lower-outer quadrant of right female breast: Secondary | ICD-10-CM | POA: Insufficient documentation

## 2018-01-05 DIAGNOSIS — Z171 Estrogen receptor negative status [ER-]: Secondary | ICD-10-CM | POA: Insufficient documentation

## 2018-01-05 DIAGNOSIS — E538 Deficiency of other specified B group vitamins: Secondary | ICD-10-CM | POA: Insufficient documentation

## 2018-01-05 DIAGNOSIS — K219 Gastro-esophageal reflux disease without esophagitis: Secondary | ICD-10-CM | POA: Insufficient documentation

## 2018-01-05 DIAGNOSIS — Z85828 Personal history of other malignant neoplasm of skin: Secondary | ICD-10-CM | POA: Insufficient documentation

## 2018-01-05 DIAGNOSIS — Z8521 Personal history of malignant neoplasm of larynx: Secondary | ICD-10-CM | POA: Insufficient documentation

## 2018-01-05 DIAGNOSIS — R5381 Other malaise: Secondary | ICD-10-CM | POA: Insufficient documentation

## 2018-01-05 DIAGNOSIS — M1990 Unspecified osteoarthritis, unspecified site: Secondary | ICD-10-CM | POA: Insufficient documentation

## 2018-01-05 DIAGNOSIS — Z853 Personal history of malignant neoplasm of breast: Secondary | ICD-10-CM | POA: Insufficient documentation

## 2018-01-05 DIAGNOSIS — C7411 Malignant neoplasm of medulla of right adrenal gland: Secondary | ICD-10-CM | POA: Insufficient documentation

## 2018-01-05 DIAGNOSIS — Z923 Personal history of irradiation: Secondary | ICD-10-CM | POA: Insufficient documentation

## 2018-01-05 MED ORDER — CYANOCOBALAMIN 1000 MCG/ML IJ SOLN
1000.0000 ug | INTRAMUSCULAR | Status: DC
  Administered 2018-01-05: 1000 ug via INTRAMUSCULAR

## 2018-01-08 ENCOUNTER — Ambulatory Visit: Payer: PRIVATE HEALTH INSURANCE

## 2018-01-08 ENCOUNTER — Telehealth: Payer: Self-pay | Admitting: *Deleted

## 2018-01-08 NOTE — Telephone Encounter (Signed)
Called pt and got voicemail and left messageand let her know that I have called genomic health about her oncotype and it will reported out on 9/12. I have mad an appt for 9/13 at 9:15 and please call me if that does no work for her.

## 2018-01-09 ENCOUNTER — Ambulatory Visit: Payer: PRIVATE HEALTH INSURANCE

## 2018-01-09 DIAGNOSIS — C50511 Malignant neoplasm of lower-outer quadrant of right female breast: Secondary | ICD-10-CM | POA: Insufficient documentation

## 2018-01-09 DIAGNOSIS — Z171 Estrogen receptor negative status [ER-]: Secondary | ICD-10-CM | POA: Insufficient documentation

## 2018-01-10 ENCOUNTER — Ambulatory Visit: Payer: PRIVATE HEALTH INSURANCE

## 2018-01-11 ENCOUNTER — Ambulatory Visit: Payer: PRIVATE HEALTH INSURANCE

## 2018-01-11 ENCOUNTER — Ambulatory Visit
Admission: RE | Admit: 2018-01-11 | Discharge: 2018-01-11 | Disposition: A | Discharge status: Home / Self Care | Payer: PRIVATE HEALTH INSURANCE | Source: Ambulatory Visit | Attending: Radiation Oncology | Admitting: Radiation Oncology

## 2018-01-11 ENCOUNTER — Encounter: Payer: Self-pay | Admitting: Oncology

## 2018-01-12 ENCOUNTER — Inpatient Hospital Stay: Payer: PRIVATE HEALTH INSURANCE

## 2018-01-12 ENCOUNTER — Ambulatory Visit: Payer: PRIVATE HEALTH INSURANCE

## 2018-01-12 ENCOUNTER — Other Ambulatory Visit: Payer: Self-pay | Admitting: General Surgery

## 2018-01-12 ENCOUNTER — Other Ambulatory Visit: Payer: Self-pay | Admitting: *Deleted

## 2018-01-12 ENCOUNTER — Ambulatory Visit
Admission: RE | Admit: 2018-01-12 | Discharge: 2018-01-12 | Disposition: A | Discharge status: Home / Self Care | Payer: PRIVATE HEALTH INSURANCE | Source: Ambulatory Visit | Attending: Radiation Oncology | Admitting: Radiation Oncology

## 2018-01-12 ENCOUNTER — Telehealth: Payer: Self-pay

## 2018-01-12 ENCOUNTER — Encounter: Payer: Self-pay | Admitting: Oncology

## 2018-01-12 ENCOUNTER — Inpatient Hospital Stay (HOSPITAL_BASED_OUTPATIENT_CLINIC_OR_DEPARTMENT_OTHER): Payer: PRIVATE HEALTH INSURANCE | Admitting: Oncology

## 2018-01-12 VITALS — BP 105/75 | HR 86 | Temp 99.8°F | Resp 18 | Ht 61.0 in | Wt 142.3 lb

## 2018-01-12 DIAGNOSIS — R918 Other nonspecific abnormal finding of lung field: Secondary | ICD-10-CM

## 2018-01-12 DIAGNOSIS — E538 Deficiency of other specified B group vitamins: Secondary | ICD-10-CM

## 2018-01-12 DIAGNOSIS — C50511 Malignant neoplasm of lower-outer quadrant of right female breast: Secondary | ICD-10-CM

## 2018-01-12 DIAGNOSIS — Z87891 Personal history of nicotine dependence: Secondary | ICD-10-CM

## 2018-01-12 DIAGNOSIS — C7411 Malignant neoplasm of medulla of right adrenal gland: Secondary | ICD-10-CM

## 2018-01-12 DIAGNOSIS — R5383 Other fatigue: Secondary | ICD-10-CM

## 2018-01-12 DIAGNOSIS — K219 Gastro-esophageal reflux disease without esophagitis: Secondary | ICD-10-CM

## 2018-01-12 DIAGNOSIS — Z853 Personal history of malignant neoplasm of breast: Secondary | ICD-10-CM

## 2018-01-12 DIAGNOSIS — Z79899 Other long term (current) drug therapy: Secondary | ICD-10-CM

## 2018-01-12 DIAGNOSIS — Z923 Personal history of irradiation: Secondary | ICD-10-CM

## 2018-01-12 DIAGNOSIS — Z8521 Personal history of malignant neoplasm of larynx: Secondary | ICD-10-CM

## 2018-01-12 DIAGNOSIS — Z7189 Other specified counseling: Secondary | ICD-10-CM | POA: Insufficient documentation

## 2018-01-12 DIAGNOSIS — M1990 Unspecified osteoarthritis, unspecified site: Secondary | ICD-10-CM

## 2018-01-12 DIAGNOSIS — Z171 Estrogen receptor negative status [ER-]: Secondary | ICD-10-CM | POA: Diagnosis not present

## 2018-01-12 DIAGNOSIS — R5381 Other malaise: Secondary | ICD-10-CM

## 2018-01-12 DIAGNOSIS — R300 Dysuria: Secondary | ICD-10-CM

## 2018-01-12 DIAGNOSIS — I2510 Atherosclerotic heart disease of native coronary artery without angina pectoris: Secondary | ICD-10-CM

## 2018-01-12 DIAGNOSIS — Z85828 Personal history of other malignant neoplasm of skin: Secondary | ICD-10-CM

## 2018-01-12 LAB — URINALYSIS, COMPLETE (UACMP) WITH MICROSCOPIC: Specific Gravity, Urine: 1.015 (ref 1.005–1.030)

## 2018-01-12 MED ORDER — ONDANSETRON HCL 8 MG PO TABS: 8.0000 mg | ORAL_TABLET | Freq: Two times a day (BID) | ORAL | 1 refills | Status: DC | PRN

## 2018-01-12 MED ORDER — PROCHLORPERAZINE MALEATE 10 MG PO TABS: 10.0000 mg | ORAL_TABLET | Freq: Four times a day (QID) | ORAL | 1 refills | Status: DC | PRN

## 2018-01-12 MED ORDER — CYANOCOBALAMIN 1000 MCG/ML IJ SOLN
1000.0000 ug | INTRAMUSCULAR | Status: DC
  Administered 2018-01-12: 1000 ug via INTRAMUSCULAR
  Filled 2018-01-12: qty 1

## 2018-01-12 MED ORDER — LIDOCAINE-PRILOCAINE 2.5-2.5 % EX CREA: TOPICAL_CREAM | CUTANEOUS | 3 refills | Status: DC

## 2018-01-12 MED ORDER — DEXAMETHASONE 4 MG PO TABS: 8.0000 mg | ORAL_TABLET | Freq: Two times a day (BID) | ORAL | 1 refills | Status: DC

## 2018-01-12 MED ORDER — SULFAMETHOXAZOLE-TRIMETHOPRIM 800-160 MG PO TABS: 1.0000 | ORAL_TABLET | Freq: Two times a day (BID) | ORAL | 0 refills | Status: AC

## 2018-01-12 MED ORDER — LORAZEPAM 0.5 MG PO TABS: 0.5000 mg | ORAL_TABLET | Freq: Four times a day (QID) | ORAL | 0 refills | Status: DC | PRN

## 2018-01-12 NOTE — Progress Notes (Signed)
Burning and pain with frequency with urnation x 1 week

## 2018-01-12 NOTE — Progress Notes (Signed)
12/28/17 telephone note states patient to get B-12 injection weekly x 4 weeks then monthly.

## 2018-01-12 NOTE — Progress Notes (Signed)
START ON PATHWAY REGIMEN - Breast     A cycle is every 21 days:     Docetaxel      Cyclophosphamide   **Always confirm dose/schedule in your pharmacy ordering system**  Patient Characteristics: Postoperative without Neoadjuvant Therapy (Pathologic Staging), Invasive Disease, Adjuvant Therapy, HER2 Negative/Unknown/Equivocal, ER Positive, Node Negative, pT1a-c, pN0/N1mi or pT2 or Higher, pN0, Oncotype High Risk (? 26) Therapeutic Status: Postoperative without Neoadjuvant Therapy (Pathologic Staging) AJCC Grade: G3 AJCC N Category: pNX AJCC M Category: cM0 ER Status: Positive (+) AJCC 8 Stage Grouping: Unknown HER2 Status: Negative (-) Oncotype Dx Recurrence Score: 53 AJCC T Category: pT1a PR Status: Negative (-) Has this patient completed genomic testing<= Yes - Oncotype DX(R) Intent of Therapy: Curative Intent, Discussed with Patient 

## 2018-01-12 NOTE — Telephone Encounter (Signed)
Message left for patient to call back about scheduling a port placement at [facility] for possibly 01/17/18.

## 2018-01-12 NOTE — Progress Notes (Signed)
Patient on plan of care prior to pathways. 

## 2018-01-12 NOTE — Telephone Encounter (Signed)
Spoke with the patient about port placement. She is scheduled for surgery at [facility] on 01/17/18. She will pre admit by phone. She is aware to call the day before to get her arrival time. She will be having her radiation treatment that morning and then again after her surgery. She will be seen in office on 01/16/18 at 2:15 pm by Dr Bary Castilla for a short pre op appointment. The patient is aware of dates, time, and instructions.

## 2018-01-12 NOTE — Progress Notes (Signed)
Hematology/Oncology Consult note [facility]  [phone] [phone]  Patient Care Team: Chrismon, Vickki Muff, PA as PCP - General (Physician Assistant)   Name of the patient: Denise Watts  [id number]  23-Oct-1955   Date of visit: 01/12/18  Diagnosis- 1. . H/o breast cancer in 1997 2. H/o stage I SCC of vocal cord in 2015. 3. Lung nodules4. Adrenal carcinoma of unknown primary s/p resection 5. Stage IaT1a NxcM0 ER weakly positive 1-10%, PR negative and her 2 negative   Chief complaint/ Reason for visit- discuss oncotype testing results and f/u ongoing fatigue  Heme/Onc history:  Oncology History   1. Carcinoma of breast, T1, N0, M0 tumor diagnosis.  In January of 1997 2. Carcinoma of the vocal cord T1, N0, M0 tumor.  Status postradiation therapy 3. Abnormal CT scan of the chest (December, 2015) 4. Repeat CT scan of chest shows stable nodule (July, 2016) 5. Neutropenia with normal hemoglobin and normal platelet count (July, 2016)     Cancer of vocal cord [facility])   11/10/2014 Initial Diagnosis    Cancer of vocal cord     Malignant neoplasm of lower-outer quadrant of right breast of female, estrogen receptor negative ([place])   11/24/2017 Initial Diagnosis    Malignant neoplasm of lower-outer quadrant of right breast of female, estrogen receptor negative ([place])    01/12/2018 Cancer Staging    Staging form: Breast, AJCC 8th Edition - Clinical stage from 01/12/2018: Stage IB (cT1a, cN0, cM0, G3, ER+, PR-, HER2-) - Signed by Sindy Guadeloupe, MD on 01/12/2018    The patient was originally diagnosed with right breast cancer in 1997, 10 mm grade 3, ER pr positive her 2 negative, node negative status post surgery and radiation treatment only. She never received chemotherapy or hormone therapy. For her vocal cord cancer, she had extensive surgery and radiation therapy in 1998. Due to past history of smoking, she was also noted to have lung nodules. She  is undergoing active surveillance imaging study of the chest.  Patient noted to have adrenal mass on CT chest which prompted PET CT in July 2018 which showed:IMPRESSION: 1. Right adrenal mass with peripheral hypermetabolism. Given interval development since 11/18/2015, suspicious for either isolated metastasis or a primary adrenal neoplasm. This should be considered for biopsy. 2. No evidence of hypermetabolic primary malignancy or extraadrenal metastasis. Pulmonary nodules are not significantly hypermetabolic. 3. Coronary artery atherosclerosis. Aortic Atherosclerosis (ICD10-I70.0). 4. Hepatic steatosis. 5. Left sixth rib hypermetabolism is favored to be related to remote Trauma.  Patient underwent right adrenalectomy which showed:DIAGNOSIS:  A. ADRENAL GLAND, RIGHT; ADRENALECTOMY:  - HIGH GRADE CARCINOMA WITH EXTENSIVE NECROSIS.  - MARGINS OF THE SPECIMEN ARE NEGATIVE FOR CARCINOMA.   Comment:  Sections demonstrate abundant necrosis confined to the adrenal medullary  compartment. In one block of tissue a small fragment of viable malignant  neoplasm was identified. A panel of immunohistochemical stains was  performed with the following pattern of immunoreactivity:  Super pancytokeratin: Positive  GATA-3: Negative  TTF-1: Negative  P40: Negative  Napsin: Negative  PAX-8: Negative  Chromogranin: Negative   Repeat CT chest abdomen and pelvis in July 2019 showed stable left lower lobe pulmonary nodule. No other evidence of metastatic disease. New 4.5 mm nodule in the right breast. This was followed by ultrasound mammogram and core biopsy of the breast lesion which was a grade 3 triple negative breast cancer. Patient was seen by Dr. Bary Castilla and underwent lumpectomy on 12/08/2017.  Interval history-she still reports significant fatigue.  She was found to have low B12 levels and is currently getting her B12 shots.  She remains independent of her ADLs.  She will be finishing  her radiation next week.  Reports some burning during urination.  She still has soreness in the region of her right breast surgery  ECOG PS- 1 Pain scale- 0 Opioid associated constipation- no  Review of systems- Review of Systems  Constitutional: Positive for malaise/fatigue. Negative for chills, fever and weight loss.  HENT: Negative for congestion, ear discharge and nosebleeds.   Eyes: Negative for blurred vision.  Respiratory: Negative for cough, hemoptysis, sputum production, shortness of breath and wheezing.   Cardiovascular: Negative for chest pain, palpitations, orthopnea and claudication.  Gastrointestinal: Negative for abdominal pain, blood in stool, constipation, diarrhea, heartburn, melena, nausea and vomiting.  Genitourinary: Negative for dysuria, flank pain, frequency, hematuria and urgency.  Musculoskeletal: Negative for back pain, joint pain and myalgias.  Skin: Negative for rash.  Neurological: Negative for dizziness, tingling, focal weakness, seizures, weakness and headaches.  Endo/Heme/Allergies: Does not bruise/bleed easily.  Psychiatric/Behavioral: Negative for depression and suicidal ideas. The patient does not have insomnia.       No Known Allergies   Past Medical History:  Diagnosis Date  . Arthritis    KNEE RIGHT  . Breast cancer [facility]) 1997   right breast lumpectomy with radiation  . Breast cancer ([place]) 11/15/2017   INVASIVE MAMMARY CARCINOMA/ triple negative  . 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[place]) 11/10/2014  . Cough 03/02/2017   INTERMTTENT DRY COUGH  . GERD (gastroesophageal reflux disease)   . History of hiatal hernia   . Personal history of radiation therapy   . Skin cancer   . Throat cancer [facility]) 1998   radiation     Past Surgical History:  Procedure Laterality Date  . BREAST BIOPSY Right 1997   positive  . BREAST BIOPSY Right 11/15/2017   INVASIVE MAMMARY CARCINOMA triple negative  . BREAST LUMPECTOMY Right 1997  . BREAST LUMPECTOMY WITH  SENTINEL LYMPH NODE BIOPSY Right 12/08/2017   Procedure: BREAST LUMPECTOMY WITH SENTINEL LYMPH NODE BX;  Surgeon: Robert Bellow, MD;  Location: [facility] ORS;  Service: General;  Laterality: Right;  . COLONOSCOPY  2015  . ESOPHAGUS SURGERY    . KNEE SURGERY Right   . ROBOTIC ADRENALECTOMY Right 12/28/2016   Procedure: ROBOTIC ADRENALECTOMY;  Surgeon: Hollice Espy, MD;  Location: [facility] ORS;  Service: Urology;  Laterality: Right;  . SKIN CANCER EXCISION    . THROAT SURGERY  1998   throat cancer   . TUBAL LIGATION    . VENTRAL HERNIA REPAIR N/A 03/03/2017   Procedure: HERNIA REPAIR VENTRAL ADULT;  Surgeon: Clayburn Pert, MD;  Location: [facility] ORS;  Service: General;  Laterality: N/A;    Social History   Socioeconomic History  . Marital status: Married    Spouse name: Not on file  . Number of children: Not on file  . Years of education: Not on file  . Highest education level: Not on file  Occupational History  . Not on file  Social Needs  . Financial resource strain: Not on file  . Food insecurity:    Worry: Not on file    Inability: Not on file  . Transportation needs:    Medical: Not on file    Non-medical: Not on file  Tobacco Use  . Smoking status: Former Smoker    Packs/day: 1.50    Years: 30.00  Pack years: 45.00    Types: Cigarettes    Last attempt to quit: 05/02/1996    Years since quitting: 21.7  . Smokeless tobacco: Never Used  Substance and Sexual Activity  . Alcohol use: Yes    Alcohol/week: 0.0 standard drinks    Comment: OCC  . Drug use: No  . Sexual activity: Not on file  Lifestyle  . Physical activity:    Days per week: Not on file    Minutes per session: Not on file  . Stress: Not on file  Relationships  . Social connections:    Talks on phone: Not on file    Gets together: Not on file    Attends religious service: Not on file    Active member of club or organization: Not on file    Attends meetings of clubs or organizations: Not on file     Relationship status: Not on file  . Intimate partner violence:    Fear of current or ex partner: Not on file    Emotionally abused: Not on file    Physically abused: Not on file    Forced sexual activity: Not on file  Other Topics Concern  . Not on file  Social History Narrative  . Not on file    Family History  Problem Relation Age of Onset  . Diabetes Mother   . Heart attack Mother   . Lung cancer Sister   . Bone cancer Sister   . Breast cancer Maternal Aunt 41  . Prostate cancer Neg Hx   . Kidney cancer Neg Hx   . Bladder Cancer Neg Hx      Current Outpatient Medications:  .  gabapentin (NEURONTIN) 300 MG capsule, Take 1 capsule (300 mg total) by mouth 3 (three) times daily., Disp: 90 capsule, Rfl: 0 .  HYDROcodone-acetaminophen (NORCO/VICODIN) 5-325 MG tablet, Take 1 tablet by mouth every 4 (four) hours as needed for moderate pain., Disp: 20 tablet, Rfl: 0 .  omeprazole (PRILOSEC) 40 MG capsule, TAKE 1 CAPSULE BY MOUTH EVERY DAY-AM, Disp: 30 capsule, Rfl: 11 .  HYDROcodone-acetaminophen (NORCO/VICODIN) 5-325 MG tablet, Take 1 tablet by mouth every 4 (four) hours as needed for moderate pain. (Patient taking differently: Take 0.5 tablets by mouth every 4 (four) hours as needed for moderate pain. ), Disp: 20 tablet, Rfl: 0 .  lidocaine-prilocaine (EMLA) cream, Apply 1 application topically as needed. (Patient not taking: Reported on 01/12/2018), Disp: 30 g, Rfl: 0 .  naproxen sodium (ALEVE) 220 MG tablet, Take 440 mg by mouth daily as needed (pain)., Disp: , Rfl:  No current facility-administered medications for this visit.   Facility-Administered Medications Ordered in Other Visits:  .  cyanocobalamin ((VITAMIN B-12)) injection 1,000 mcg, 1,000 mcg, Intramuscular, Q30 days, Sindy Guadeloupe, MD, 1,000 mcg at 01/12/18 1017  Physical exam:  Vitals:   01/12/18 0920  BP: 105/75  Pulse: 86  Resp: 18  Temp: 99.8 F (37.7 C)  TempSrc: Tympanic  Weight: 142 lb 4.8 oz (64.5 kg)    Height: 5' 1"  (1.549 m)   Physical Exam  Constitutional: She is oriented to person, place, and time. She appears well-developed and well-nourished.  Appears fatigued  HENT:  Head: Normocephalic and atraumatic.  Eyes: Pupils are equal, round, and reactive to light. EOM are normal.  Neck: Normal range of motion.  Cardiovascular: Normal rate, regular rhythm and normal heart sounds.  Pulmonary/Chest: Effort normal and breath sounds normal.  Abdominal: Soft. Bowel sounds are normal.  Neurological:  She is alert and oriented to person, place, and time.  Skin: Skin is warm and dry.     CMP Latest Ref Rng & Units 12/27/2017  Glucose 70 - 99 mg/dL 100(H)  BUN 8 - 23 mg/dL 10  Creatinine 0.44 - 1.00 mg/dL 0.71  Sodium 135 - 145 mmol/L 144  Potassium 3.5 - 5.1 mmol/L 4.4  Chloride 98 - 111 mmol/L 107  CO2 22 - 32 mmol/L 29  Calcium 8.9 - 10.3 mg/dL 9.4  Total Protein 6.5 - 8.1 g/dL 6.6  Total Bilirubin 0.3 - 1.2 mg/dL 0.7  Alkaline Phos 38 - 126 U/L 60  AST 15 - 41 U/L 25  ALT 0 - 44 U/L 14   CBC Latest Ref Rng & Units 12/27/2017  WBC 3.6 - 11.0 K/uL 4.8  Hemoglobin 12.0 - 16.0 g/dL 14.7  Hematocrit 35.0 - 47.0 % 43.1  Platelets 150 - 440 K/uL 228    No images are attached to the encounter.  US Breast Complete Uni Right Inc Axilla  Result Date: 12/29/2017 Limited ultrasound without images was completed to determine if there was evidence of seroma.  No seroma was evident.     Assessment and plan- Patient is a 62 y.o. 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 Final path showed 5 mm grade 3 Er weakly positive 1-10%, pr negative and her 2 negative tumor.  She is here to discuss the results of Oncotype testing  Patient  had a 5 mm weakly ER positive tumor.  Given her ongoing fatigue Oncotype testing was done to see if she could potentially avoid chemotherapy.  Oncotype shows a recurrence score of 53 with her risk of distant recurrence at 9 years at greater than 39% with hormone therapy unknown.  Absolute benefit of chemotherapy was greater than 15% in her age group.  I have therefore recommended that we should proceed with adjuvant TC chemotherapy cautiously.  If she were to have significant side effects we will have a low threshold to stop chemotherapy.  Discussed and benefits of chemotherapy including all but not limited to nausea, vomiting, fatigue, hair loss, risk of infections and hospitalization.  Risk of peripheral neuropathy associated with Taxotere.  Plan to give her Taxotere and Cytoxan IV every 3 weeks for 4 cycles with on pro-Neulasta support.  Treatment will be given with curative intent.  I will tentatively plan to start chemotherapy in about 10 days time upon completion of radiation treatment.  We will touch base with Dr. Bary Castilla regarding port placement for her  Fatigue: Advised her to try American ginseng thousand milligrams twice daily as it was shown to have positive effect on fatigue based on randomized control trials.  She will also continue to get her B12 shots.  TSH and cortisol levels were normal  Patient reports some burning micturition at this time and we will check urinalysis and urine culture today for and if positive I will give her 5 days of Bactrim.  Her prior urine cultures have been pansensitive to antibiotics  Cancer Staging Malignant neoplasm of lower-outer quadrant of right breast of female, estrogen receptor negative ([place]) Staging form: Breast, AJCC 8th Edition - Clinical stage from 01/12/2018: Stage IB (cT1a, cN0, cM0, G3, ER+, PR-,  HER2-) - Signed by Sindy Guadeloupe, MD on 01/12/2018     Visit Diagnosis 1. Dysuria   2. Malignant neoplasm of lower-outer quadrant of right breast of  female, estrogen receptor negative ([place])   3. Goals of care, counseling/discussion   4. Other fatigue   5. B12 deficiency      Dr. Randa Evens, MD, MPH [facility] [phone] 01/12/2018 12:49 PM

## 2018-01-14 LAB — URINE CULTURE: Culture: 50000 — AB

## 2018-01-15 ENCOUNTER — Ambulatory Visit
Admission: RE | Admit: 2018-01-15 | Discharge: 2018-01-15 | Disposition: A | Discharge status: Home / Self Care | Payer: PRIVATE HEALTH INSURANCE | Source: Ambulatory Visit | Attending: Radiation Oncology | Admitting: Radiation Oncology

## 2018-01-15 ENCOUNTER — Telehealth: Payer: Self-pay

## 2018-01-15 NOTE — Patient Instructions (Signed)
Docetaxel injection What is this medicine? DOCETAXEL (doe se TAX el) is a chemotherapy drug. It targets fast dividing cells, like cancer cells, and causes these cells to die. This medicine is used to treat many types of cancers like breast cancer, certain stomach cancers, head and neck cancer, lung cancer, and prostate cancer. This medicine may be used for other purposes; ask your health care provider or pharmacist if you have questions. COMMON BRAND NAME(S): Docefrez, Taxotere What should I tell my health care provider before I take this medicine? They need to know if you have any of these conditions: -infection (especially a virus infection such as chickenpox, cold sores, or herpes) -liver disease -low blood counts, like low white cell, platelet, or red cell counts -an unusual or allergic reaction to docetaxel, polysorbate 80, other chemotherapy agents, other medicines, foods, dyes, or preservatives -pregnant or trying to get pregnant -breast-feeding How should I use this medicine? This drug is given as an infusion into a vein. It is administered in a hospital or clinic by a specially trained health care professional. Talk to your pediatrician regarding the use of this medicine in children. Special care may be needed. Overdosage: If you think you have taken too much of this medicine contact a poison control center or emergency room at once. NOTE: This medicine is only for you. Do not share this medicine with others. What if I miss a dose? It is important not to miss your dose. Call your doctor or health care professional if you are unable to keep an appointment. What may interact with this medicine? -cyclosporine -erythromycin -ketoconazole -medicines to increase blood counts like filgrastim, pegfilgrastim, sargramostim -vaccines Talk to your doctor or health care professional before taking any of these medicines: -acetaminophen -aspirin -ibuprofen -ketoprofen -naproxen This list  may not describe all possible interactions. Give your health care provider a list of all the medicines, herbs, non-prescription drugs, or dietary supplements you use. Also tell them if you smoke, drink alcohol, or use illegal drugs. Some items may interact with your medicine. What should I watch for while using this medicine? Your condition will be monitored carefully while you are receiving this medicine. You will need important blood work done while you are taking this medicine. This drug may make you feel generally unwell. This is not uncommon, as chemotherapy can affect healthy cells as well as cancer cells. Report any side effects. Continue your course of treatment even though you feel ill unless your doctor tells you to stop. In some cases, you may be given additional medicines to help with side effects. Follow all directions for their use. Call your doctor or health care professional for advice if you get a fever, chills or sore throat, or other symptoms of a cold or flu. Do not treat yourself. This drug decreases your body's ability to fight infections. Try to avoid being around people who are sick. This medicine may increase your risk to bruise or bleed. Call your doctor or health care professional if you notice any unusual bleeding. This medicine may contain alcohol in the product. You may get drowsy or dizzy. Do not drive, use machinery, or do anything that needs mental alertness until you know how this medicine affects you. Do not stand or sit up quickly, especially if you are an older patient. This reduces the risk of dizzy or fainting spells. Avoid alcoholic drinks. Do not become pregnant while taking this medicine. Women should inform their doctor if they wish to become pregnant or   think they might be pregnant. There is a potential for serious side effects to an unborn child. Talk to your health care professional or pharmacist for more information. Do not breast-feed an infant while taking  this medicine. What side effects may I notice from receiving this medicine? Side effects that you should report to your doctor or health care professional as soon as possible: -allergic reactions like skin rash, itching or hives, swelling of the face, lips, or tongue -low blood counts - This drug may decrease the number of white blood cells, red blood cells and platelets. You may be at increased risk for infections and bleeding. -signs of infection - fever or chills, cough, sore throat, pain or difficulty passing urine -signs of decreased platelets or bleeding - bruising, pinpoint red spots on the skin, black, tarry stools, nosebleeds -signs of decreased red blood cells - unusually weak or tired, fainting spells, lightheadedness -breathing problems -fast or irregular heartbeat -low blood pressure -mouth sores -nausea and vomiting -pain, swelling, redness or irritation at the injection site -pain, tingling, numbness in the hands or feet -swelling of the ankle, feet, hands -weight gain Side effects that usually do not require medical attention (report to your doctor or health care professional if they continue or are bothersome): -bone pain -complete hair loss including hair on your head, underarms, pubic hair, eyebrows, and eyelashes -diarrhea -excessive tearing -changes in the color of fingernails -loosening of the fingernails -nausea -muscle pain -red flush to skin -sweating -weak or tired This list may not describe all possible side effects. Call your doctor for medical advice about side effects. You may report side effects to FDA at 1-800-FDA-1088. Where should I keep my medicine? This drug is given in a hospital or clinic and will not be stored at home. NOTE: This sheet is a summary. It may not cover all possible information. If you have questions about this medicine, talk to your doctor, pharmacist, or health care provider.  2018 Elsevier/Gold Standard (2015-05-21  12:32:56) Cyclophosphamide injection What is this medicine? CYCLOPHOSPHAMIDE (sye kloe FOSS fa mide) is a chemotherapy drug. It slows the growth of cancer cells. This medicine is used to treat many types of cancer like lymphoma, myeloma, leukemia, breast cancer, and ovarian cancer, to name a few. This medicine may be used for other purposes; ask your health care provider or pharmacist if you have questions. COMMON BRAND NAME(S): Cytoxan, Neosar What should I tell my health care provider before I take this medicine? They need to know if you have any of these conditions: -blood disorders -history of other chemotherapy -infection -kidney disease -liver disease -recent or ongoing radiation therapy -tumors in the bone marrow -an unusual or allergic reaction to cyclophosphamide, other chemotherapy, other medicines, foods, dyes, or preservatives -pregnant or trying to get pregnant -breast-feeding How should I use this medicine? This drug is usually given as an injection into a vein or muscle or by infusion into a vein. It is administered in a hospital or clinic by a specially trained health care professional. Talk to your pediatrician regarding the use of this medicine in children. Special care may be needed. Overdosage: If you think you have taken too much of this medicine contact a poison control center or emergency room at once. NOTE: This medicine is only for you. Do not share this medicine with others. What if I miss a dose? It is important not to miss your dose. Call your doctor or health care professional if you are unable to keep   an appointment. What may interact with this medicine? This medicine may interact with the following medications: -amiodarone -amphotericin B -azathioprine -certain antiviral medicines for HIV or AIDS such as protease inhibitors (e.g., indinavir, ritonavir) and zidovudine -certain blood pressure medications such as benazepril, captopril, enalapril, fosinopril,  lisinopril, moexipril, monopril, perindopril, quinapril, ramipril, trandolapril -certain cancer medications such as anthracyclines (e.g., daunorubicin, doxorubicin), busulfan, cytarabine, paclitaxel, pentostatin, tamoxifen, trastuzumab -certain diuretics such as chlorothiazide, chlorthalidone, hydrochlorothiazide, indapamide, metolazone -certain medicines that treat or prevent blood clots like warfarin -certain muscle relaxants such as succinylcholine -cyclosporine -etanercept -indomethacin -medicines to increase blood counts like filgrastim, pegfilgrastim, sargramostim -medicines used as general anesthesia -metronidazole -natalizumab This list may not describe all possible interactions. Give your health care provider a list of all the medicines, herbs, non-prescription drugs, or dietary supplements you use. Also tell them if you smoke, drink alcohol, or use illegal drugs. Some items may interact with your medicine. What should I watch for while using this medicine? Visit your doctor for checks on your progress. This drug may make you feel generally unwell. This is not uncommon, as chemotherapy can affect healthy cells as well as cancer cells. Report any side effects. Continue your course of treatment even though you feel ill unless your doctor tells you to stop. Drink water or other fluids as directed. Urinate often, even at night. In some cases, you may be given additional medicines to help with side effects. Follow all directions for their use. Call your doctor or health care professional for advice if you get a fever, chills or sore throat, or other symptoms of a cold or flu. Do not treat yourself. This drug decreases your body's ability to fight infections. Try to avoid being around people who are sick. This medicine may increase your risk to bruise or bleed. Call your doctor or health care professional if you notice any unusual bleeding. Be careful brushing and flossing your teeth or using a  toothpick because you may get an infection or bleed more easily. If you have any dental work done, tell your dentist you are receiving this medicine. You may get drowsy or dizzy. Do not drive, use machinery, or do anything that needs mental alertness until you know how this medicine affects you. Do not become pregnant while taking this medicine or for 1 year after stopping it. Women should inform their doctor if they wish to become pregnant or think they might be pregnant. Men should not father a child while taking this medicine and for 4 months after stopping it. There is a potential for serious side effects to an unborn child. Talk to your health care professional or pharmacist for more information. Do not breast-feed an infant while taking this medicine. This medicine may interfere with the ability to have a child. This medicine has caused ovarian failure in some women. This medicine has caused reduced sperm counts in some men. You should talk with your doctor or health care professional if you are concerned about your fertility. If you are going to have surgery, tell your doctor or health care professional that you have taken this medicine. What side effects may I notice from receiving this medicine? Side effects that you should report to your doctor or health care professional as soon as possible: -allergic reactions like skin rash, itching or hives, swelling of the face, lips, or tongue -low blood counts - this medicine may decrease the number of white blood cells, red blood cells and platelets. You may be at increased   risk for infections and bleeding. -signs of infection - fever or chills, cough, sore throat, pain or difficulty passing urine -signs of decreased platelets or bleeding - bruising, pinpoint red spots on the skin, black, tarry stools, blood in the urine -signs of decreased red blood cells - unusually weak or tired, fainting spells, lightheadedness -breathing problems -dark  urine -dizziness -palpitations -swelling of the ankles, feet, hands -trouble passing urine or change in the amount of urine -weight gain -yellowing of the eyes or skin Side effects that usually do not require medical attention (report to your doctor or health care professional if they continue or are bothersome): -changes in nail or skin color -hair loss -missed menstrual periods -mouth sores -nausea, vomiting This list may not describe all possible side effects. Call your doctor for medical advice about side effects. You may report side effects to FDA at 1-800-FDA-1088. Where should I keep my medicine? This drug is given in a hospital or clinic and will not be stored at home. NOTE: This sheet is a summary. It may not cover all possible information. If you have questions about this medicine, talk to your doctor, pharmacist, or health care provider.  2018 Elsevier/Gold Standard (2012-03-02 16:22:58)  

## 2018-01-15 NOTE — Telephone Encounter (Signed)
Spoke with Denise Watts to inform her, per Dr Janese Banks she was unable to locate the [employer] in the Pharmacy, but was able to find it on [employer] site. The patient was agreeable and understanding to go to the web site and order medication.

## 2018-01-16 ENCOUNTER — Ambulatory Visit
Admission: RE | Admit: 2018-01-16 | Discharge: 2018-01-16 | Disposition: A | Discharge status: Home / Self Care | Payer: PRIVATE HEALTH INSURANCE | Source: Ambulatory Visit | Attending: Radiation Oncology | Admitting: Radiation Oncology

## 2018-01-16 ENCOUNTER — Other Ambulatory Visit: Payer: Self-pay

## 2018-01-16 ENCOUNTER — Inpatient Hospital Stay: Payer: PRIVATE HEALTH INSURANCE

## 2018-01-16 ENCOUNTER — Encounter
Admission: RE | Admit: 2018-01-16 | Discharge: 2018-01-16 | Disposition: A | Discharge status: Home / Self Care | Payer: PRIVATE HEALTH INSURANCE | Source: Ambulatory Visit | Attending: General Surgery | Admitting: General Surgery

## 2018-01-16 ENCOUNTER — Ambulatory Visit (INDEPENDENT_AMBULATORY_CARE_PROVIDER_SITE_OTHER): Payer: No Typology Code available for payment source | Admitting: General Surgery

## 2018-01-16 ENCOUNTER — Encounter: Payer: Self-pay | Admitting: General Surgery

## 2018-01-16 VITALS — BP 130/70 | HR 74 | Resp 12 | Ht 61.0 in | Wt 141.0 lb

## 2018-01-16 DIAGNOSIS — Z171 Estrogen receptor negative status [ER-]: Secondary | ICD-10-CM

## 2018-01-16 DIAGNOSIS — C50511 Malignant neoplasm of lower-outer quadrant of right female breast: Secondary | ICD-10-CM

## 2018-01-16 MED ORDER — CEFAZOLIN SODIUM-DEXTROSE 2-4 GM/100ML-% IV SOLN
2.0000 g | INTRAVENOUS | Status: AC
  Administered 2018-01-17: 2 g via INTRAVENOUS

## 2018-01-16 NOTE — Patient Instructions (Signed)
Patient is scheduled on port placement 01/17/2018. The patient is aware to call back for any questions or concerns.

## 2018-01-16 NOTE — Patient Instructions (Signed)
Your procedure is scheduled on: 01-17-18  Report to Same Day Surgery 2nd floor medical mall [facility] Entrance-take elevator on left to 2nd floor.  Check in with surgery information desk.) @ 11 AM PER PT  Remember: Instructions that are not followed completely may result in serious medical risk, up to and including death, or upon the discretion of your surgeon and anesthesiologist your surgery may need to be rescheduled.    _x___ 1. Do not eat food after midnight the night before your procedure. You may drink clear liquids up to 2 hours before you are scheduled to arrive at the hospital for your procedure.  Do not drink clear liquids within 2 hours of your scheduled arrival to the hospital.  Clear liquids include  --Water or Apple juice without pulp  --Clear carbohydrate beverage such as ClearFast or Gatorade  --Black Coffee or Clear Tea (No milk, no creamers, do not add anything to the coffee or Tea.   ____Ensure clear carbohydrate drink on the way to the hospital for bariatric patients  ____Ensure clear carbohydrate drink 3 hours before surgery for Dr Dwyane Luo patients if physician instructed.   No gum chewing or hard candies.     __x__ 2. No Alcohol for 24 hours before or after surgery.   __x__3. No Smoking or e-cigarettes for 24 prior to surgery.  Do not use any chewable tobacco products for at least 6 hour prior to surgery   ____  4. Bring all medications with you on the day of surgery if instructed.    __x__ 5. Notify your doctor if there is any change in your medical condition     (cold, fever, infections).    x___6. On the morning of surgery brush your teeth with toothpaste and water.  You may rinse your mouth with mouth wash if you wish.  Do not swallow any toothpaste or mouthwash.   Do not wear jewelry, make-up, hairpins, clips or nail polish.  Do not wear lotions, powders, or perfumes. You may wear deodorant.  Do not shave 48 hours prior to surgery. Men may shave face  and neck.  Do not bring valuables to the hospital.    [facility] is not responsible for any belongings or valuables.               Contacts, dentures or bridgework may not be worn into surgery.  Leave your suitcase in the car. After surgery it may be brought to your room.  For patients admitted to the hospital, discharge time is determined by your treatment team.  _  Patients discharged the day of surgery will not be allowed to drive home.  You will need someone to drive you home and stay with you the night of your procedure.    Please read over the following fact sheets that you were given:   [facility] Preparing for Surgery   _x___ TAKE THE FOLLOWING MEDICATION THE MORNING OF SURGERY WITH A SMALL SIP OF WATER. These include:  1. GABAPENTIN  2. PRILOSEC  3. TAKE A PRILOSEC TONIGHT BEFORE BED  4.  5.  6.  ____Fleets enema or Magnesium Citrate as directed.   ____ Use CHG Soap or sage wipes as directed on instruction sheet   ____ Use inhalers on the day of surgery and bring to hospital day of surgery  ____ Stop Metformin and Janumet 2 days prior to surgery.    ____ Take 1/2 of usual insulin dose the night before surgery and none on  the morning surgery.   ____ Follow recommendations from Cardiologist, Pulmonologist or PCP regarding stopping Aspirin, Coumadin, Plavix ,Eliquis, Effient, or Pradaxa, and Pletal.  ____Stop Anti-inflammatories such as Advil, Aleve, Ibuprofen, Motrin, Naproxen, Naprosyn, Goodies powders or aspirin products. OK to take Tylenol    ____ Stop supplements until after surgery.   ____ Bring C-Pap to the hospital.

## 2018-01-16 NOTE — Progress Notes (Signed)
Patient ID: Denise Watts, female   DOB: [birth year], 62 y.o.   MRN: [id number]  Chief Complaint  Patient presents with  . Pre-op Exam    HPI Denise Watts is a 62 y.o. female here today for her pre op port placement scheduled on 01/17/2018.  HPI  Past Medical History:  Diagnosis Date  . Arthritis    KNEE RIGHT  . Breast cancer [facility]) 1997   right breast lumpectomy with radiation  . Breast cancer ([place]) 11/15/2017   INVASIVE MAMMARY CARCINOMA/ triple negative  . 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 Cough 03/02/2017   INTERMTTENT DRY COUGH  . GERD (gastroesophageal reflux disease)   . History of hiatal hernia   . Personal history of radiation therapy   . Skin cancer   . Throat cancer [facility]) 1998   radiation    Past Surgical History:  Procedure Laterality Date  . BREAST BIOPSY Right 1997   positive  . BREAST BIOPSY Right 11/15/2017   INVASIVE MAMMARY CARCINOMA triple negative  . BREAST LUMPECTOMY Right 1997  . BREAST LUMPECTOMY WITH SENTINEL LYMPH NODE BIOPSY Right 12/08/2017   Procedure: BREAST LUMPECTOMY WITH SENTINEL LYMPH NODE BX;  Surgeon: Robert Bellow, MD;  Location: [facility] ORS;  Service: General;  Laterality: Right;  . COLONOSCOPY  2015  . ESOPHAGUS SURGERY    . KNEE SURGERY Right   . ROBOTIC ADRENALECTOMY Right 12/28/2016   Procedure: ROBOTIC ADRENALECTOMY;  Surgeon: Hollice Espy, MD;  Location: [facility] ORS;  Service: Urology;  Laterality: Right;  . SKIN CANCER EXCISION    . THROAT SURGERY  1998   throat cancer   . TUBAL LIGATION    . VENTRAL HERNIA REPAIR N/A 03/03/2017   Procedure: HERNIA REPAIR VENTRAL ADULT;  Surgeon: Clayburn Pert, MD;  Location: [facility] ORS;  Service: General;  Laterality: N/A;    Family History  Problem Relation Age of Onset  . Diabetes Mother   . Heart attack Mother   . Lung cancer Sister   . Bone cancer Sister   . Breast cancer Maternal Aunt 41  . Prostate cancer Neg Hx   . Kidney cancer Neg Hx   . Bladder Cancer Neg Hx      Social History Social History   Tobacco Use  . Smoking status: Former Smoker    Packs/day: 1.50    Years: 30.00    Pack years: 45.00    Types: Cigarettes    Last attempt to quit: 05/02/1996    Years since quitting: 21.7  . Smokeless tobacco: Never Used  Substance Use Topics  . Alcohol use: Yes    Alcohol/week: 0.0 standard drinks    Comment: OCC  . Drug use: No    No Known Allergies  Current Outpatient Medications  Medication Sig Dispense Refill  . Cyanocobalamin (B-12 COMPLIANCE INJECTION IJ) Inject 1 Dose as directed every 30 (thirty) days.    Marland Kitchen dexamethasone (DECADRON) 4 MG tablet Take 2 tablets (8 mg total) by mouth 2 (two) times daily. Start the day before Taxotere. Then again the day after chemo for 3 days. 30 tablet 1  . gabapentin (NEURONTIN) 300 MG capsule Take 1 capsule (300 mg total) by mouth 3 (three) times daily. 90 capsule 0  . HYDROcodone-acetaminophen (NORCO/VICODIN) 5-325 MG tablet Take 1 tablet by mouth every 4 (four) hours as needed for moderate pain. (Patient taking differently: Take 0.5 tablets by mouth every 4 (four) hours as needed for moderate pain. ) 20 tablet 0  .  lidocaine-prilocaine (EMLA) cream Apply 1 application topically as needed. (Patient taking differently: Apply 1 application topically as needed (port access). ) 30 g 0  . LORazepam (ATIVAN) 0.5 MG tablet Take 1 tablet (0.5 mg total) by mouth every 6 (six) hours as needed (Nausea or vomiting). 30 tablet 0  . naproxen sodium (ALEVE) 220 MG tablet Take 220 mg by mouth daily as needed (pain).     Marland Kitchen omeprazole (PRILOSEC) 40 MG capsule TAKE 1 CAPSULE BY MOUTH EVERY DAY-AM 30 capsule 11  . ondansetron (ZOFRAN) 8 MG tablet Take 1 tablet (8 mg total) by mouth 2 (two) times daily as needed for refractory nausea / vomiting. Start on day 3 after chemo. 30 tablet 1  . prochlorperazine (COMPAZINE) 10 MG tablet Take 1 tablet (10 mg total) by mouth every 6 (six) hours as needed (Nausea or vomiting). 30 tablet 1   . sulfamethoxazole-trimethoprim (BACTRIM DS,SEPTRA DS) 800-160 MG tablet Take 1 tablet by mouth 2 (two) times daily for 5 days. 10 tablet 0   No current facility-administered medications for this visit.     Review of Systems Review of Systems  Constitutional: Negative.   Respiratory: Negative.   Cardiovascular: Negative.     Blood pressure 130/70, pulse 74, resp. rate 12, height 5\' 1"  (1.549 m), weight 141 lb (64 kg).  Physical Exam Physical Exam  Constitutional: She is oriented to person, place, and time. She appears well-developed and well-nourished.  Cardiovascular: Normal rate, regular rhythm and normal heart sounds.  Pulmonary/Chest: Effort normal and breath sounds normal.    Neurological: She is alert and oriented to person, place, and time.  Skin: Skin is warm and dry.    Data Reviewed Medical oncology notes reviewed.  Plans for adjuvant chemotherapy based on near triple negative status and unexplained poorly differentiated adenocarcinoma noted within the right adrenal gland removed in summer 2018.  Central venous access requested.  CT of the chest abdomen and pelvis dated November 06, 2017 reviewed.  Assessment    Doing well status post reexcision and now accelerated external beam radiation to the right breast.  Request for central venous access.    Plan  The risks associated with central venous access including arterial, pulmonary and venous injury were reviewed. The possible need for additional treatment if pulmonary injury occurs (chest tube placement) was discussed.  Considering her previous head neck cancer treated with radiation and her previous right breast radiation, pulmonary nodules which are being followed with serial CT exams and recent breast reexcision, I think she would be best served by a right IJ placement with port on the right anterior chest wall.  This will be out of the field of her accelerated breast radiation and put her at lower risk for a  pulmonary injury during port placement.  Patient is scheduled on port placement 01/17/2018. The patient is aware to call back for any questions or concerns.  HPI, Physical Exam, Assessment and Plan have been scribed under the direction and in the presence of Hervey Ard, MD.  Gaspar Cola, CMA  I have completed the exam and reviewed the above documentation for accuracy and completeness.  I agree with the above.  Haematologist has been used and any errors in dictation or transcription are unintentional.  Hervey Ard, M.D., F.A.C.S.  Forest Gleason [facility] 01/16/2018, 8:53 PM

## 2018-01-17 ENCOUNTER — Ambulatory Visit
Admission: RE | Admit: 2018-01-17 | Discharge: 2018-01-17 | Disposition: A | Discharge status: Home / Self Care | Payer: PRIVATE HEALTH INSURANCE | Source: Ambulatory Visit | Attending: General Surgery | Admitting: General Surgery

## 2018-01-17 ENCOUNTER — Ambulatory Visit: Payer: PRIVATE HEALTH INSURANCE

## 2018-01-17 ENCOUNTER — Other Ambulatory Visit: Payer: Self-pay

## 2018-01-17 ENCOUNTER — Ambulatory Visit: Payer: PRIVATE HEALTH INSURANCE | Admitting: Registered Nurse

## 2018-01-17 ENCOUNTER — Ambulatory Visit
Admission: RE | Admit: 2018-01-17 | Discharge: 2018-01-17 | Disposition: A | Discharge status: Home / Self Care | Payer: PRIVATE HEALTH INSURANCE | Source: Ambulatory Visit | Attending: Radiation Oncology | Admitting: Radiation Oncology

## 2018-01-17 ENCOUNTER — Encounter: Payer: Self-pay | Admitting: *Deleted

## 2018-01-17 ENCOUNTER — Encounter
Admission: RE | Disposition: A | Discharge status: Home / Self Care | Payer: Self-pay | Source: Ambulatory Visit | Attending: General Surgery

## 2018-01-17 DIAGNOSIS — Z87891 Personal history of nicotine dependence: Secondary | ICD-10-CM | POA: Insufficient documentation

## 2018-01-17 DIAGNOSIS — C50911 Malignant neoplasm of unspecified site of right female breast: Secondary | ICD-10-CM | POA: Diagnosis not present

## 2018-01-17 DIAGNOSIS — Z79899 Other long term (current) drug therapy: Secondary | ICD-10-CM | POA: Diagnosis not present

## 2018-01-17 DIAGNOSIS — Z171 Estrogen receptor negative status [ER-]: Secondary | ICD-10-CM | POA: Insufficient documentation

## 2018-01-17 DIAGNOSIS — Z85828 Personal history of other malignant neoplasm of skin: Secondary | ICD-10-CM | POA: Diagnosis not present

## 2018-01-17 DIAGNOSIS — C50511 Malignant neoplasm of lower-outer quadrant of right female breast: Secondary | ICD-10-CM

## 2018-01-17 DIAGNOSIS — Z95828 Presence of other vascular implants and grafts: Secondary | ICD-10-CM

## 2018-01-17 DIAGNOSIS — K219 Gastro-esophageal reflux disease without esophagitis: Secondary | ICD-10-CM | POA: Insufficient documentation

## 2018-01-17 HISTORY — PX: PORTACATH PLACEMENT: SHX2246

## 2018-01-17 SURGERY — INSERTION, TUNNELED CENTRAL VENOUS DEVICE, WITH PORT
Anesthesia: General | Laterality: Right

## 2018-01-17 MED ORDER — GLYCOPYRROLATE 0.2 MG/ML IJ SOLN
INTRAMUSCULAR | Status: AC
  Filled 2018-01-17: qty 1

## 2018-01-17 MED ORDER — PROPOFOL 500 MG/50ML IV EMUL
INTRAVENOUS | Status: AC
  Filled 2018-01-17: qty 50

## 2018-01-17 MED ORDER — LIDOCAINE HCL (PF) 2 % IJ SOLN
INTRAMUSCULAR | Status: DC | PRN
  Administered 2018-01-17: 40 mg via INTRADERMAL

## 2018-01-17 MED ORDER — CEFAZOLIN SODIUM-DEXTROSE 2-4 GM/100ML-% IV SOLN
INTRAVENOUS | Status: AC
  Filled 2018-01-17: qty 100

## 2018-01-17 MED ORDER — SODIUM CHLORIDE 0.9 % IJ SOLN
INTRAMUSCULAR | Status: AC
  Filled 2018-01-17: qty 50

## 2018-01-17 MED ORDER — ONDANSETRON HCL 4 MG/2ML IJ SOLN: 4.0000 mg | Freq: Once | INTRAMUSCULAR | Status: DC | PRN

## 2018-01-17 MED ORDER — FENTANYL CITRATE (PF) 100 MCG/2ML IJ SOLN: 25.0000 ug | INTRAMUSCULAR | Status: DC | PRN

## 2018-01-17 MED ORDER — LIDOCAINE HCL (PF) 1 % IJ SOLN
INTRAMUSCULAR | Status: AC
  Filled 2018-01-17: qty 30

## 2018-01-17 MED ORDER — LIDOCAINE HCL (PF) 1 % IJ SOLN
INTRAMUSCULAR | Status: DC | PRN
  Administered 2018-01-17: 10 mL

## 2018-01-17 MED ORDER — MIDAZOLAM HCL 2 MG/2ML IJ SOLN
INTRAMUSCULAR | Status: AC
  Filled 2018-01-17: qty 2

## 2018-01-17 MED ORDER — LACTATED RINGERS IV SOLN
INTRAVENOUS | Status: DC
  Administered 2018-01-17: 13:00:00 via INTRAVENOUS

## 2018-01-17 MED ORDER — FENTANYL CITRATE (PF) 100 MCG/2ML IJ SOLN
INTRAMUSCULAR | Status: DC | PRN
  Administered 2018-01-17: 25 ug via INTRAVENOUS

## 2018-01-17 MED ORDER — GLYCOPYRROLATE 0.2 MG/ML IJ SOLN
INTRAMUSCULAR | Status: DC | PRN
  Administered 2018-01-17: 0.2 mg via INTRAVENOUS

## 2018-01-17 MED ORDER — MIDAZOLAM HCL 2 MG/2ML IJ SOLN
INTRAMUSCULAR | Status: DC | PRN
  Administered 2018-01-17: 2 mg via INTRAVENOUS

## 2018-01-17 MED ORDER — PROPOFOL 500 MG/50ML IV EMUL
INTRAVENOUS | Status: DC | PRN
  Administered 2018-01-17: 150 ug/kg/min via INTRAVENOUS

## 2018-01-17 MED ORDER — FENTANYL CITRATE (PF) 100 MCG/2ML IJ SOLN
INTRAMUSCULAR | Status: AC
  Filled 2018-01-17: qty 2

## 2018-01-17 MED ORDER — LIDOCAINE HCL (PF) 2 % IJ SOLN
INTRAMUSCULAR | Status: AC
  Filled 2018-01-17: qty 10

## 2018-01-17 MED ORDER — LACTATED RINGERS IV SOLN
INTRAVENOUS | Status: DC
  Administered 2018-01-17: 12:00:00 via INTRAVENOUS

## 2018-01-17 SURGICAL SUPPLY — 29 items
BLADE SURG 15 STRL SS SAFETY (BLADE) ×3 IMPLANT
CHLORAPREP W/TINT 26ML (MISCELLANEOUS) ×3 IMPLANT
CLOSURE WOUND 1/2 X4 (GAUZE/BANDAGES/DRESSINGS) ×1
COVER LIGHT HANDLE STERIS (MISCELLANEOUS) ×6 IMPLANT
DECANTER SPIKE VIAL GLASS SM (MISCELLANEOUS) ×6 IMPLANT
DRAPE C-ARM XRAY 36X54 (DRAPES) ×3 IMPLANT
DRAPE LAPAROTOMY TRNSV 106X77 (MISCELLANEOUS) ×3 IMPLANT
DRSG TEGADERM 2-3/8X2-3/4 SM (GAUZE/BANDAGES/DRESSINGS) ×3 IMPLANT
DRSG TEGADERM 4X4.75 (GAUZE/BANDAGES/DRESSINGS) ×3 IMPLANT
DRSG TELFA 4X3 1S NADH ST (GAUZE/BANDAGES/DRESSINGS) ×3 IMPLANT
ELECT REM PT RETURN 9FT ADLT (ELECTROSURGICAL) ×3
ELECTRODE REM PT RTRN 9FT ADLT (ELECTROSURGICAL) ×1 IMPLANT
GLOVE BIO SURGEON STRL SZ7.5 (GLOVE) ×3 IMPLANT
GLOVE INDICATOR 8.0 STRL GRN (GLOVE) ×3 IMPLANT
GOWN STRL REUS W/ TWL LRG LVL3 (GOWN DISPOSABLE) ×2 IMPLANT
GOWN STRL REUS W/TWL LRG LVL3 (GOWN DISPOSABLE) ×4
KIT PORT POWER 8FR ISP CVUE (Port) ×3 IMPLANT
KIT TURNOVER KIT A (KITS) ×3 IMPLANT
LABEL OR SOLS (LABEL) ×3 IMPLANT
NS IRRIG 500ML POUR BTL (IV SOLUTION) ×3 IMPLANT
PACK PORT-A-CATH (MISCELLANEOUS) ×3 IMPLANT
STRIP CLOSURE SKIN 1/2X4 (GAUZE/BANDAGES/DRESSINGS) ×2 IMPLANT
SUT PROLENE 3 0 SH DA (SUTURE) ×3 IMPLANT
SUT VIC AB 3-0 SH 27 (SUTURE) ×2
SUT VIC AB 3-0 SH 27X BRD (SUTURE) ×1 IMPLANT
SUT VIC AB 4-0 FS2 27 (SUTURE) ×3 IMPLANT
SWABSTK COMLB BENZOIN TINCTURE (MISCELLANEOUS) ×3 IMPLANT
SYR 10ML LL (SYRINGE) ×3 IMPLANT
SYR 10ML SLIP (SYRINGE) ×3 IMPLANT

## 2018-01-17 NOTE — Discharge Instructions (Signed)
AMBULATORY SURGERY  °DISCHARGE INSTRUCTIONS ° ° °1) The drugs that you were given will stay in your system until tomorrow so for the next 24 hours you should not: ° °A) Drive an automobile °B) Make any legal decisions °C) Drink any alcoholic beverage ° ° °2) You may resume regular meals tomorrow.  Today it is better to start with liquids and gradually work up to solid foods. ° °You may eat anything you prefer, but it is better to start with liquids, then soup and crackers, and gradually work up to solid foods. ° ° °3) Please notify your doctor immediately if you have any unusual bleeding, trouble breathing, redness and pain at the surgery site, drainage, fever, or pain not relieved by medication. ° ° ° °4) Additional Instructions: ° ° ° ° ° ° ° °Please contact your physician with any problems or Same Day Surgery at [phone], Monday through Friday 6 am to 4 pm, or [place] at [place] [facility] number at [phone]. °

## 2018-01-17 NOTE — Op Note (Signed)
Preoperative diagnosis: Breast cancer, need for central venous access.  Postoperative diagnosis: Same.  Operative procedure: Right internal jugular PowerPort placement.  Operating Surgeon: Hervey Ard, MD.  Anesthesia: Attended local, 10 cc 1% plain Xylocaine.  Estimated blood loss: Less than 5 cc.  Clinical note: This 62 year old woman recently underwent treatment for recurrent right breast cancer.  It is essentially triple negative and as she had previously had a metastatic mass removed from the right adrenal gland thought possibly from a breast origin she is felt to be a candidate for adjuvant chemotherapy.  Central venous access was requested by her treating oncologist.  It was elected to make use of the internal jugular route based on her previous head neck cancer.  Operative note: With the patient under adequate sedation the right neck and chest was cleansed with ChloraPrep and draped.  Ultrasound was used to confirm patency of the right internal jugular vein.  Local anesthesia was infiltrated in the vein cannulated under ultrasound guidance.  The guidewire was passed and fluoroscopy used to position the catheter tip at the junction of the SVC and right atrium.  This was then tunneled to a pocket on the right anterior chest.  The port was anchored with 3-0 Prolene sutures, two-point fixation.  The wound was closed with a running 3-0 Vicryl suture to the adipose layer and a running 4-0 Vicryl subcuticular suture to the skin.  A simple subarticular suture was placed at the vein cannulation site.  The catheter easily irrigated and aspirated with the patient in the supine position.  It was flushed with 10 cc of injectable saline.  Benzoin and Steri-Strips followed by Telfa and Tegaderm dressings were applied.  Erect portable chest x-ray was obtained in the recovery room.  No evidence of pneumothorax.  Catheter tip as described above.

## 2018-01-17 NOTE — Anesthesia Procedure Notes (Signed)
Date/Time: 01/17/2018 12:21 PM Performed by: Doreen Salvage, CRNA Pre-anesthesia Checklist: Patient identified, Emergency Drugs available, Suction available and Patient being monitored Patient Re-evaluated:Patient Re-evaluated prior to induction Oxygen Delivery Method: Simple face mask Induction Type: IV induction Dental Injury: Teeth and Oropharynx as per pre-operative assessment

## 2018-01-17 NOTE — H&P (Signed)
No change since yesterday's exam. For right IJ power port placement.

## 2018-01-17 NOTE — Transfer of Care (Signed)
Immediate Anesthesia Transfer of Care Note  Patient: Denise Watts  Procedure(s) Performed: Procedure(s): INSERTION PORT-A-CATH- RIGHT (Right)  Patient Location: PACU  Anesthesia Type:General  Level of Consciousness: sedated  Airway & Oxygen Therapy: Patient Spontanous Breathing and Patient connected to face mask oxygen  Post-op Assessment: Report given to RN and Post -op Vital signs reviewed and stable  Post vital signs: Reviewed and stable  Last Vitals:  Vitals:   01/17/18 1139 01/17/18 1308  BP: 133/76 (!) 89/46  Pulse: 72 92  Resp: 20 12  Temp: 36.7 C   SpO2: 959% 74%    Complications: No apparent anesthesia complications

## 2018-01-17 NOTE — Anesthesia Postprocedure Evaluation (Signed)
Anesthesia Post Note  Patient: Denise Watts  Procedure(s) Performed: INSERTION PORT-A-CATH- RIGHT (Right )  Patient location during evaluation: PACU Anesthesia Type: General Level of consciousness: awake and alert Pain management: pain level controlled Vital Signs Assessment: post-procedure vital signs reviewed and stable Respiratory status: spontaneous breathing and respiratory function stable Cardiovascular status: stable Anesthetic complications: no     Last Vitals:  Vitals:   01/17/18 1311 01/17/18 1318  BP: 96/67 100/69  Pulse: 95 83  Resp: (!) 22 10  Temp:    SpO2: 98% 96%    Last Pain:  Vitals:   01/17/18 1311  TempSrc:   PainSc: 0-No pain                 Brodie Scovell K

## 2018-01-17 NOTE — Anesthesia Post-op Follow-up Note (Signed)
Anesthesia QCDR form completed.        

## 2018-01-17 NOTE — Anesthesia Preprocedure Evaluation (Signed)
Anesthesia Evaluation  Patient identified by MRN, date of birth, ID band Patient awake    Reviewed: Allergy & Precautions, NPO status , Patient's Chart, lab work & pertinent test results  History of Anesthesia Complications Negative for: history of anesthetic complications  Airway Mallampati: II       Dental   Pulmonary neg sleep apnea, neg COPD, former smoker,           Cardiovascular (-) hypertension(-) Past MI and (-) CHF (-) dysrhythmias (-) Valvular Problems/Murmurs     Neuro/Psych neg Seizures    GI/Hepatic Neg liver ROS, hiatal hernia, GERD (s/p haital hernia repair)  Medicated,  Endo/Other  neg diabetes  Renal/GU negative Renal ROS     Musculoskeletal   Abdominal   Peds  Hematology   Anesthesia Other Findings   Reproductive/Obstetrics                             Anesthesia Physical Anesthesia Plan  ASA: II  Anesthesia Plan: General   Post-op Pain Management:    Induction: Intravenous  PONV Risk Score and Plan: 3 and TIVA, Propofol infusion and Midazolam  Airway Management Planned: Nasal Cannula  Additional Equipment:   Intra-op Plan:   Post-operative Plan:   Informed Consent: I have reviewed the patients History and Physical, chart, labs and discussed the procedure including the risks, benefits and alternatives for the proposed anesthesia with the patient or authorized representative who has indicated his/her understanding and acceptance.     Plan Discussed with:   Anesthesia Plan Comments:         Anesthesia Quick Evaluation

## 2018-01-18 ENCOUNTER — Ambulatory Visit: Payer: No Typology Code available for payment source | Admitting: General Surgery

## 2018-01-18 ENCOUNTER — Inpatient Hospital Stay: Payer: PRIVATE HEALTH INSURANCE

## 2018-01-18 ENCOUNTER — Inpatient Hospital Stay (HOSPITAL_BASED_OUTPATIENT_CLINIC_OR_DEPARTMENT_OTHER): Payer: PRIVATE HEALTH INSURANCE | Admitting: Genetics

## 2018-01-18 ENCOUNTER — Encounter: Payer: Self-pay | Admitting: General Surgery

## 2018-01-18 DIAGNOSIS — C7491 Malignant neoplasm of unspecified part of right adrenal gland: Secondary | ICD-10-CM

## 2018-01-18 DIAGNOSIS — E538 Deficiency of other specified B group vitamins: Secondary | ICD-10-CM

## 2018-01-18 DIAGNOSIS — C50511 Malignant neoplasm of lower-outer quadrant of right female breast: Secondary | ICD-10-CM

## 2018-01-18 DIAGNOSIS — Z853 Personal history of malignant neoplasm of breast: Secondary | ICD-10-CM

## 2018-01-18 DIAGNOSIS — Z85819 Personal history of malignant neoplasm of unspecified site of lip, oral cavity, and pharynx: Secondary | ICD-10-CM

## 2018-01-18 DIAGNOSIS — Z171 Estrogen receptor negative status [ER-]: Secondary | ICD-10-CM

## 2018-01-18 DIAGNOSIS — C7490 Malignant neoplasm of unspecified part of unspecified adrenal gland: Secondary | ICD-10-CM | POA: Diagnosis not present

## 2018-01-18 DIAGNOSIS — C320 Malignant neoplasm of glottis: Secondary | ICD-10-CM | POA: Diagnosis not present

## 2018-01-18 DIAGNOSIS — C4490 Unspecified malignant neoplasm of skin, unspecified: Secondary | ICD-10-CM | POA: Insufficient documentation

## 2018-01-18 DIAGNOSIS — Z1379 Encounter for other screening for genetic and chromosomal anomalies: Secondary | ICD-10-CM | POA: Diagnosis not present

## 2018-01-18 MED ORDER — CYANOCOBALAMIN 1000 MCG/ML IJ SOLN
1000.0000 ug | INTRAMUSCULAR | Status: DC
  Administered 2018-01-18: 1000 ug via INTRAMUSCULAR

## 2018-01-18 NOTE — Progress Notes (Signed)
REFERRING PROVIDER: Sindy Guadeloupe, MD [place], [place] [address]  PRIMARY PROVIDER:  Margo Common, [place]  PRIMARY REASON FOR VISIT:  1. Malignant neoplasm of lower-outer quadrant of right breast of female, estrogen receptor negative ([place])   2. Adrenal carcinoma, unspecified laterality ([place])   3. 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 Personal history of malignant neoplasm of breast   5. History of throat cancer   6. Malignant neoplasm of adrenal gland, unspecified laterality ([place])   7. Adrenal cancer, right ([place])   8. History of right breast cancer   9. History of breast cancer   10. Skin cancer     HISTORY OF PRESENT ILLNESS:   Denise Watts, a 62 y.o. female, was seen for a [facility] cancer genetics consultation at the request of Dr. Janese Banks due to a personal and family history of cancer.  Denise Watts presents to clinic today to discuss the possibility of a hereditary predisposition to cancer, genetic testing, and to further clarify her future cancer risks, as well as potential cancer risks for family members.   At the age of 42, Denise Watts was diagnosed with breast cancer.  She underwent lumpectomy and radiation.  The patient reports a year after her breast cancer, she was diagnosed with SCC of the vocal chord.  In 2018, she had an adrenal mass removed that was carcinoma- unknown primary origin.  Recently in July, 2019, Denise Watts has been diagnosed with triple negative invasive mammary carcinoma. Denise Watts also reports having a history of skin carcinoma.   CANCER HISTORY:  Oncology History   1. Carcinoma of breast, T1, N0, M0 tumor diagnosis.  In January of 1997 2. Carcinoma of the vocal cord T1, N0, M0 tumor.  Status postradiation therapy 3. Abnormal CT scan of the chest (December, 2015) 4. Repeat CT scan of chest shows stable nodule (July, 2016) 5. Neutropenia with normal hemoglobin and normal platelet count (July, 2016)     Cancer of vocal cord [facility])   11/10/2014 Initial  Diagnosis    Cancer of vocal cord     Malignant neoplasm of lower-outer quadrant of right breast of female, estrogen receptor negative ([facility])   11/24/2017 Initial Diagnosis    Malignant neoplasm of lower-outer quadrant of right breast of female, estrogen receptor negative ([place])    01/11/2018 -  Chemotherapy    The patient had PALONOSETRON HCL INJECTION 0.25 MG/5ML, 0.25 mg, Intravenous,  Once, 0 of 4 cycles pegfilgrastim-cbqv (UDENYCA) injection 6 mg, 6 mg, Subcutaneous, Once, 0 of 4 cycles cyclophosphamide (CYTOXAN) 1,000 mg in sodium chloride 0.9 % 250 mL chemo infusion, 600 mg/m2, Intravenous,  Once, 0 of 4 cycles DOCEtaxel (TAXOTERE) 130 mg in sodium chloride 0.9 % 250 mL chemo infusion, 75 mg/m2, Intravenous,  Once, 0 of 4 cycles  for chemotherapy treatment.     01/12/2018 Cancer Staging    Staging form: Breast, AJCC 8th Edition - Clinical stage from 01/12/2018: Stage IB (cT1a, cN0, cM0, G3, ER+, PR-, HER2-) - Signed by Sindy Guadeloupe, MD on 01/12/2018      HORMONAL RISK FACTORS:  First live birth at age 85.  Ovaries intact: yes.  Hysterectomy: no.  Menopausal status: postmenopausal.  Colonoscopy: yes; pt reports a few polyps, has gone for c-scope every 5-10 years.  Past Medical History:  Diagnosis Date  . Arthritis    KNEE RIGHT  . Breast cancer [facility]) 1997   right breast lumpectomy with radiation  . Breast cancer ([place])  11/15/2017   INVASIVE MAMMARY CARCINOMA/ triple negative  . 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[place]) 11/10/2014  . Cough 03/02/2017   INTERMTTENT DRY COUGH  . GERD (gastroesophageal reflux disease)   . History of hiatal hernia   . Personal history of radiation therapy   . Skin cancer   . Throat cancer [facility]) 1998   radiation    Past Surgical History:  Procedure Laterality Date  . BREAST BIOPSY Right 1997   positive  . BREAST BIOPSY Right 11/15/2017   INVASIVE MAMMARY CARCINOMA triple negative  . BREAST LUMPECTOMY Right 1997  . BREAST LUMPECTOMY WITH SENTINEL  LYMPH NODE BIOPSY Right 12/08/2017   Procedure: BREAST LUMPECTOMY WITH SENTINEL LYMPH NODE BX;  Surgeon: Robert Bellow, MD;  Location: [facility] ORS;  Service: General;  Laterality: Right;  . COLONOSCOPY  2015  . ESOPHAGUS SURGERY    . KNEE SURGERY Right   . PORTACATH PLACEMENT Right 01/17/2018   Procedure: INSERTION PORT-A-CATH- RIGHT;  Surgeon: Robert Bellow, MD;  Location: [facility] ORS;  Service: General;  Laterality: Right;  . ROBOTIC ADRENALECTOMY Right 12/28/2016   Procedure: ROBOTIC ADRENALECTOMY;  Surgeon: Hollice Espy, MD;  Location: [facility] ORS;  Service: Urology;  Laterality: Right;  . SKIN CANCER EXCISION    . THROAT SURGERY  1998   throat cancer   . TUBAL LIGATION    . VENTRAL HERNIA REPAIR N/A 03/03/2017   Procedure: HERNIA REPAIR VENTRAL ADULT;  Surgeon: Clayburn Pert, MD;  Location: [facility] ORS;  Service: General;  Laterality: N/A;    Social History   Socioeconomic History  . Marital status: Married    Spouse name: Not on file  . Number of children: Not on file  . Years of education: Not on file  . Highest education level: Not on file  Occupational History  . Not on file  Social Needs  . Financial resource strain: Not on file  . Food insecurity:    Worry: Not on file    Inability: Not on file  . Transportation needs:    Medical: Not on file    Non-medical: Not on file  Tobacco Use  . Smoking status: Former Smoker    Packs/day: 1.50    Years: 30.00    Pack years: 45.00    Types: Cigarettes    Last attempt to quit: 05/02/1996    Years since quitting: 21.7  . Smokeless tobacco: Never Used  Substance and Sexual Activity  . Alcohol use: Yes    Alcohol/week: 0.0 standard drinks    Comment: OCC  . Drug use: No  . Sexual activity: Not on file  Lifestyle  . Physical activity:    Days per week: Not on file    Minutes per session: Not on file  . Stress: Not on file  Relationships  . Social connections:    Talks on phone: Not on file    Gets together: Not on  file    Attends religious service: Not on file    Active member of club or organization: Not on file    Attends meetings of clubs or organizations: Not on file    Relationship status: Not on file  Other Topics Concern  . Not on file  Social History Narrative  . Not on file     FAMILY HISTORY:  We obtained a detailed, 4-generation family history.  Significant diagnoses are listed below: Family History  Problem Relation Age of Onset  . Diabetes Mother   . Heart attack Mother   .  Cancer Sister        cancer of eyes, spread to lung, other places. died at 58  . Cancer Sister 67       unknown primary, spread to bone, brain died in 20's  . Cancer Paternal Aunt        unk type  . Cancer Paternal Grandmother        type unk  . Prostate cancer Neg Hx   . Kidney cancer Neg Hx   . Bladder Cancer Neg Hx     Denise Watts has 2 daughters (ages 67 and 57) and a son who is 74 with no history of cancer.  Denise Watts has 4 grandchildren. Denise Watts has 2 full sisters and 1 maternal half-sister: -1 full sister had cancer of her eyes this spread to lungs, other locations and she died at 22.  -1 full sister was first diagnosed with cancer in her 84's (origin/primary unk).  She fought this cancer off/on for 15 years-it came back I her brain, bones, other locations.  She died in her 35's.  -1 maternal half-sister is 23 with no history of cancer  Denise Watts's father: died in his 30's due to vehicle accident Paternal Aunts/Uncles: several paternal aunts/uncles- limited information available about these relatives.  Denise Watts is aware of 1 paternal aunt who had cancer- type unk.  Paternal cousins: no known history of cancer, limited contact with these relatives.  Paternal grandfather: unk Paternal grandmother:diede of cancer- type unk  Denise Watts's mother: died in her late 74's due to a heart attack Maternal Aunts/Uncles: 13 maternal aunts/uncles- diabetes and heart disease is frequent in these relatives.  No known  history of cancer Maternal cousins: limited information- no known cancerdx Maternal grandfather: no hx of cancer Maternal grandmother:died in her 79's/80's no hx of cancer she is aware of  Denise Watts is unaware of previous family history of genetic testing for hereditary cancer risks. Patient's maternal ancestors are of Caucasian/Native American descent, and paternal ancestors are of Caucasian descent. There is no reported Ashkenazi Jewish ancestry. There is no known consanguinity.  GENETIC COUNSELING ASSESSMENT: Denise Watts is a 62 y.o. female with a personal and family history which is somewhat suggestive of a Hereditary Cancer Predisposition Syndrome. We, therefore, discussed and recommended the following at today's visit.   DISCUSSION: We reviewed the characteristics, features and inheritance patterns of hereditary cancer syndromes. We also discussed genetic testing, including the appropriate family members to test, the process of testing, insurance coverage and turn-around-time for results. We discussed the implications of a negative, positive and/or variant of uncertain significant result. We recommended Denise Watts pursue genetic testing for the Multi-Cancer gene panel.   The Multi-Cancer Panel offered by [facility] includes sequencing and/or deletion duplication testing of the following 91 genes: AIP, ALK, APC, ATM, AXIN2, BAP1, BARD1, BLM, BMPR1A, BRCA1, BRCA2, BRIP1, BUB1B, CASR, CDC73, CDH1, CDK4, CDKN1B, CDKN1C, CDKN2A, CEBPA, CEP57, CHEK2, CTNNA1, DICER1, DIS3L2, EGFR, ENG, EPCAM, FH, FLCN, GALNT12, GATA2, GPC3, GREM1, HOXB13, HRAS, KIT, MAX, MEN1, MET, MITF, MLH1, MLH3, MSH2, MSH3, MSH6, MUTYH, NBN, NF1, NF2, NTHL1, PALB2, PDGFRA, PHOX2B, PMS2, POLD1, POLE, POT1, PRKAR1A, PTCH1, PTEN, RAD50, RAD51C, RAD51D, RB1, RECQL4, RET, RNF43, RPS20, RUNX1, SDHA, SDHAF2, SDHB, SDHC, SDHD, SMAD4, SMARCA4, SMARCB1, SMARCE1, STK11, SUFU, TERC, TERT, TMEM127, TP53, TSC1, TSC2, VHL, WRN, WT1  We discussed that  only 5-10% of cancers are associated with a Hereditary cancer predisposition syndrome.  One of the most common hereditary cancer syndromes that increases breast cancer  risk is called Hereditary Breast and Ovarian Cancer (HBOC) syndrome.  This syndrome is caused by mutations in the BRCA1 and BRCA2 genes.  This syndrome increases an individual's lifetime risk to develop breast, ovarian, pancreatic, and other types of cancer.  There are also many other cancer predisposition syndromes caused by mutations in several other genes.    We discussed that if she is found to have a mutation in one of these genes, it may impact future medical management recommendations such as increased cancer screenings and consideration of risk reducing surgeries.  A positive result could also have implications for the patient's family members.  A Negative result would mean we were unable to identify a hereditary component to her cancer, but does not rule out the possibility of a hereditary basis for her cancers.  There could be mutations that are undetectable by current technology, or in genes not yet tested or identified to increase cancer risk.    We discussed the potential to find a Variant of Uncertain Significance or VUS.  These are variants that have not yet been identified as pathogenic or benign, and it is unknown if this variant is associated with increased cancer risk or if this is a normal finding.  Most VUS's are reclassified to benign or likely benign.   It should not be used to make medical management decisions. With time, we suspect the lab will determine the significance of any VUS's identified if any.   Based on Ms. Fera's personal and family history of cancer, she meets medical criteria for genetic testing. Despite that she meets criteria, she may still have an out of pocket cost. The laboratory can provide her with an estimate of her OOP cost.  she was given the contact information for the laboratory if she has  further questions. Marland Kitchen    PLAN: After considering the risks, benefits, and limitations, Ms. Leaming  provided informed consent to pursue genetic testing and the blood sample was sent to [facility] for analysis of the Multi-Cancer Panel. Results should be available within approximately 2-3 weeks' time, at which point they will be disclosed by telephone to Ms. Graef, as will any additional recommendations warranted by these results. Ms. Avitia will receive a summary of her genetic counseling visit and a copy of her results once available. This information will also be available in Epic. We encouraged Ms. Travelstead to remain in contact with cancer genetics annually so that we can continuously update the family history and inform her of any changes in cancer genetics and testing that may be of benefit for her family. Ms. Armenteros questions were answered to her satisfaction today. Our contact information was provided should additional questions or concerns arise.  Lastly, we encouraged Ms. Scaife to remain in contact with cancer genetics annually so that we can continuously update the family history and inform her of any changes in cancer genetics and testing that may be of benefit for this family.   Ms.  Regino questions were answered to her satisfaction today. Our contact information was provided should additional questions or concerns arise. Thank you for the referral and allowing [place] to share in the care of your patient.   Tana Felts, MS, [facility] Certified Genetic Counselor [email or web address] phone: [phone]  The patient was seen for a total of 40 minutes in face-to-face genetic counseling.  The patient was accompanied today by her daughter. This patient was discussed with Drs. Magrinat, Lindi Adie and/or Burr Medico who agrees with the above.

## 2018-01-19 ENCOUNTER — Inpatient Hospital Stay: Payer: PRIVATE HEALTH INSURANCE

## 2018-01-22 ENCOUNTER — Encounter: Payer: Self-pay | Admitting: General Surgery

## 2018-01-29 ENCOUNTER — Other Ambulatory Visit: Payer: Self-pay

## 2018-01-29 DIAGNOSIS — Z85819 Personal history of malignant neoplasm of unspecified site of lip, oral cavity, and pharynx: Secondary | ICD-10-CM

## 2018-01-30 ENCOUNTER — Encounter: Payer: Self-pay | Admitting: Oncology

## 2018-01-30 ENCOUNTER — Inpatient Hospital Stay: Payer: PRIVATE HEALTH INSURANCE | Attending: Oncology

## 2018-01-30 ENCOUNTER — Inpatient Hospital Stay (HOSPITAL_BASED_OUTPATIENT_CLINIC_OR_DEPARTMENT_OTHER): Payer: PRIVATE HEALTH INSURANCE | Admitting: Oncology

## 2018-01-30 ENCOUNTER — Inpatient Hospital Stay: Payer: PRIVATE HEALTH INSURANCE

## 2018-01-30 VITALS — BP 132/85 | HR 69 | Temp 98.5°F | Resp 18

## 2018-01-30 VITALS — BP 120/80 | HR 75 | Temp 98.8°F | Resp 18 | Ht 61.0 in | Wt 142.4 lb

## 2018-01-30 DIAGNOSIS — R5383 Other fatigue: Secondary | ICD-10-CM | POA: Diagnosis not present

## 2018-01-30 DIAGNOSIS — Z7952 Long term (current) use of systemic steroids: Secondary | ICD-10-CM | POA: Insufficient documentation

## 2018-01-30 DIAGNOSIS — Z8521 Personal history of malignant neoplasm of larynx: Secondary | ICD-10-CM | POA: Insufficient documentation

## 2018-01-30 DIAGNOSIS — Z7689 Persons encountering health services in other specified circumstances: Secondary | ICD-10-CM | POA: Diagnosis not present

## 2018-01-30 DIAGNOSIS — M545 Low back pain: Secondary | ICD-10-CM | POA: Insufficient documentation

## 2018-01-30 DIAGNOSIS — I2510 Atherosclerotic heart disease of native coronary artery without angina pectoris: Secondary | ICD-10-CM | POA: Diagnosis not present

## 2018-01-30 DIAGNOSIS — C7951 Secondary malignant neoplasm of bone: Secondary | ICD-10-CM

## 2018-01-30 DIAGNOSIS — I700 Atherosclerosis of aorta: Secondary | ICD-10-CM | POA: Diagnosis not present

## 2018-01-30 DIAGNOSIS — R110 Nausea: Secondary | ICD-10-CM | POA: Diagnosis not present

## 2018-01-30 DIAGNOSIS — R918 Other nonspecific abnormal finding of lung field: Secondary | ICD-10-CM

## 2018-01-30 DIAGNOSIS — Z171 Estrogen receptor negative status [ER-]: Secondary | ICD-10-CM

## 2018-01-30 DIAGNOSIS — R109 Unspecified abdominal pain: Secondary | ICD-10-CM | POA: Insufficient documentation

## 2018-01-30 DIAGNOSIS — R630 Anorexia: Secondary | ICD-10-CM | POA: Diagnosis not present

## 2018-01-30 DIAGNOSIS — M1990 Unspecified osteoarthritis, unspecified site: Secondary | ICD-10-CM

## 2018-01-30 DIAGNOSIS — C50511 Malignant neoplasm of lower-outer quadrant of right female breast: Secondary | ICD-10-CM

## 2018-01-30 DIAGNOSIS — Z87891 Personal history of nicotine dependence: Secondary | ICD-10-CM | POA: Insufficient documentation

## 2018-01-30 DIAGNOSIS — Z5111 Encounter for antineoplastic chemotherapy: Secondary | ICD-10-CM | POA: Diagnosis not present

## 2018-01-30 DIAGNOSIS — R5381 Other malaise: Secondary | ICD-10-CM | POA: Diagnosis not present

## 2018-01-30 DIAGNOSIS — F418 Other specified anxiety disorders: Secondary | ICD-10-CM | POA: Insufficient documentation

## 2018-01-30 DIAGNOSIS — Z85828 Personal history of other malignant neoplasm of skin: Secondary | ICD-10-CM

## 2018-01-30 DIAGNOSIS — R4702 Dysphasia: Secondary | ICD-10-CM | POA: Insufficient documentation

## 2018-01-30 DIAGNOSIS — K219 Gastro-esophageal reflux disease without esophagitis: Secondary | ICD-10-CM

## 2018-01-30 DIAGNOSIS — Z853 Personal history of malignant neoplasm of breast: Secondary | ICD-10-CM

## 2018-01-30 DIAGNOSIS — C801 Malignant (primary) neoplasm, unspecified: Secondary | ICD-10-CM

## 2018-01-30 DIAGNOSIS — Z923 Personal history of irradiation: Secondary | ICD-10-CM

## 2018-01-30 DIAGNOSIS — E860 Dehydration: Secondary | ICD-10-CM | POA: Insufficient documentation

## 2018-01-30 DIAGNOSIS — Z85819 Personal history of malignant neoplasm of unspecified site of lip, oral cavity, and pharynx: Secondary | ICD-10-CM

## 2018-01-30 DIAGNOSIS — B370 Candidal stomatitis: Secondary | ICD-10-CM | POA: Diagnosis not present

## 2018-01-30 DIAGNOSIS — R21 Rash and other nonspecific skin eruption: Secondary | ICD-10-CM | POA: Insufficient documentation

## 2018-01-30 DIAGNOSIS — E538 Deficiency of other specified B group vitamins: Secondary | ICD-10-CM | POA: Insufficient documentation

## 2018-01-30 DIAGNOSIS — Z79899 Other long term (current) drug therapy: Secondary | ICD-10-CM

## 2018-01-30 DIAGNOSIS — C7971 Secondary malignant neoplasm of right adrenal gland: Secondary | ICD-10-CM | POA: Diagnosis not present

## 2018-01-30 DIAGNOSIS — R42 Dizziness and giddiness: Secondary | ICD-10-CM | POA: Diagnosis not present

## 2018-01-30 DIAGNOSIS — K760 Fatty (change of) liver, not elsewhere classified: Secondary | ICD-10-CM | POA: Insufficient documentation

## 2018-01-30 DIAGNOSIS — R531 Weakness: Secondary | ICD-10-CM | POA: Insufficient documentation

## 2018-01-30 LAB — CBC WITH DIFFERENTIAL/PLATELET
Basophils Absolute: 0 10*3/uL (ref 0–0.1)
Basophils Relative: 1 %
EOS ABS: 0.1 10*3/uL (ref 0–0.7)
Eosinophils Relative: 1 %
HCT: 40.1 % (ref 35.0–47.0)
HEMOGLOBIN: 13.7 g/dL (ref 12.0–16.0)
Lymphocytes Relative: 8 %
Lymphs Abs: 0.4 10*3/uL — ABNORMAL LOW (ref 1.0–3.6)
MCH: 34.2 pg — AB (ref 26.0–34.0)
MCHC: 34.1 g/dL (ref 32.0–36.0)
MCV: 100.3 fL — ABNORMAL HIGH (ref 80.0–100.0)
MONOS PCT: 4 %
Monocytes Absolute: 0.2 10*3/uL (ref 0.2–0.9)
NEUTROS ABS: 4.2 10*3/uL (ref 1.4–6.5)
NEUTROS PCT: 86 %
Platelets: 182 10*3/uL (ref 150–440)
RBC: 3.99 MIL/uL (ref 3.80–5.20)
RDW: 12.4 % (ref 11.5–14.5)
WBC: 4.9 10*3/uL (ref 3.6–11.0)

## 2018-01-30 LAB — COMPREHENSIVE METABOLIC PANEL
ALT: 19 U/L (ref 0–44)
AST: 32 U/L (ref 15–41)
Albumin: 3.9 g/dL (ref 3.5–5.0)
Alkaline Phosphatase: 66 U/L (ref 38–126)
Anion gap: 9 (ref 5–15)
BUN: 12 mg/dL (ref 8–23)
CHLORIDE: 103 mmol/L (ref 98–111)
CO2: 27 mmol/L (ref 22–32)
CREATININE: 0.71 mg/dL (ref 0.44–1.00)
Calcium: 9.2 mg/dL (ref 8.9–10.3)
GFR calc non Af Amer: >60 mL/min (ref >60)
Glucose, Bld: 118 mg/dL — ABNORMAL HIGH (ref 70–99)
Potassium: 4.4 mmol/L (ref 3.5–5.1)
SODIUM: 139 mmol/L (ref 135–145)
Total Bilirubin: 0.5 mg/dL (ref 0.3–1.2)
Total Protein: 6.8 g/dL (ref 6.5–8.1)

## 2018-01-30 MED ORDER — SODIUM CHLORIDE 0.9% FLUSH
10.0000 mL | Freq: Once | INTRAVENOUS | Status: AC
  Administered 2018-01-30: 10 mL via INTRAVENOUS
  Filled 2018-01-30: qty 10

## 2018-01-30 MED ORDER — SODIUM CHLORIDE 0.9 % IV SOLN
Freq: Once | INTRAVENOUS | Status: AC
  Administered 2018-01-30: 09:00:00 via INTRAVENOUS
  Filled 2018-01-30: qty 250

## 2018-01-30 MED ORDER — SODIUM CHLORIDE 0.9 % IV SOLN
600.0000 mg/m2 | Freq: Once | INTRAVENOUS | Status: AC
  Administered 2018-01-30: 1000 mg via INTRAVENOUS
  Filled 2018-01-30: qty 50

## 2018-01-30 MED ORDER — SODIUM CHLORIDE 0.9 % IV SOLN: 10.0000 mg | Freq: Once | INTRAVENOUS | Status: DC

## 2018-01-30 MED ORDER — PALONOSETRON HCL INJECTION 0.25 MG/5ML
0.2500 mg | Freq: Once | INTRAVENOUS | Status: AC
  Administered 2018-01-30: 0.25 mg via INTRAVENOUS
  Filled 2018-01-30: qty 5

## 2018-01-30 MED ORDER — SODIUM CHLORIDE 0.9 % IV SOLN
75.0000 mg/m2 | Freq: Once | INTRAVENOUS | Status: AC
  Administered 2018-01-30: 130 mg via INTRAVENOUS
  Filled 2018-01-30: qty 13

## 2018-01-30 MED ORDER — HEPARIN SOD (PORK) LOCK FLUSH 100 UNIT/ML IV SOLN
500.0000 [IU] | Freq: Once | INTRAVENOUS | Status: AC
  Administered 2018-01-30: 500 [IU] via INTRAVENOUS
  Filled 2018-01-30: qty 5

## 2018-01-30 MED ORDER — HEPARIN SOD (PORK) LOCK FLUSH 100 UNIT/ML IV SOLN: 500.0000 [IU] | Freq: Once | INTRAVENOUS | Status: DC | PRN

## 2018-01-30 MED ORDER — PEGFILGRASTIM 6 MG/0.6ML ~~LOC~~ PSKT
6.0000 mg | PREFILLED_SYRINGE | Freq: Once | SUBCUTANEOUS | Status: AC
  Administered 2018-01-30: 6 mg via SUBCUTANEOUS
  Filled 2018-01-30: qty 0.6

## 2018-01-30 MED ORDER — DEXAMETHASONE SODIUM PHOSPHATE 10 MG/ML IJ SOLN
10.0000 mg | Freq: Once | INTRAMUSCULAR | Status: AC
  Administered 2018-01-30: 10 mg via INTRAVENOUS
  Filled 2018-01-30: qty 1

## 2018-01-30 NOTE — Progress Notes (Signed)
Hematology/Oncology Consult note [facility]  Telephone:([phone] Fax:([phone]) [phone]  Patient Care Team: Chrismon, Vickki Muff, PA as PCP - General (Physician Assistant)   Name of the patient: Denise Watts  [id number]  11/07/1955   Date of visit: 01/30/18  Diagnosis-  1.. H/o breast cancer in 1997 2. H/o stage I SCC of vocal cord in 2015. 3. Lung nodules4. Adrenal carcinoma of unknown primary s/p resection5. Stage IaT1a NxcM0 ER weakly positive 1-10%, PR negative and her 2 negative  Chief complaint/ Reason for visit-on treatment assessment prior to cycle 1 of adjuvant TC chemotherapy  Heme/Onc history:  Oncology History   1. Carcinoma of breast, T1, N0, M0 tumor diagnosis.  In January of 1997 2. Carcinoma of the vocal cord T1, N0, M0 tumor.  Status postradiation therapy 3. Abnormal CT scan of the chest (December, 2015) 4. Repeat CT scan of chest shows stable nodule (July, 2016) 5. Neutropenia with normal hemoglobin and normal platelet count (July, 2016)     Cancer of vocal cord [facility])   11/10/2014 Initial Diagnosis    Cancer of vocal cord     Malignant neoplasm of lower-outer quadrant of right breast of female, estrogen receptor negative ([place])   11/24/2017 Initial Diagnosis    Malignant neoplasm of lower-outer quadrant of right breast of female, estrogen receptor negative ([place])    01/11/2018 -  Chemotherapy    The patient had PALONOSETRON HCL INJECTION 0.25 MG/5ML, 0.25 mg, Intravenous,  Once, 0 of 4 cycles pegfilgrastim-cbqv (UDENYCA) injection 6 mg, 6 mg, Subcutaneous, Once, 0 of 4 cycles cyclophosphamide (CYTOXAN) 1,000 mg in sodium chloride 0.9 % 250 mL chemo infusion, 600 mg/m2, Intravenous,  Once, 0 of 4 cycles DOCEtaxel (TAXOTERE) 130 mg in sodium chloride 0.9 % 250 mL chemo infusion, 75 mg/m2, Intravenous,  Once, 0 of 4 cycles  for chemotherapy treatment.     01/12/2018 Cancer Staging    Staging form: Breast, AJCC 8th Edition - Clinical  stage from 01/12/2018: Stage IB (cT1a, cN0, cM0, G3, ER+, PR-, HER2-) - Signed by Sindy Guadeloupe, MD on 01/12/2018    The patient was originally diagnosed with right breast cancer in 1997,10 mm grade 3, ER pr positive her 2 negative, node negativestatus post surgery and radiation treatment only. She never received chemotherapy or hormone therapy. For her vocal cord cancer, she had extensive surgery and radiation therapy in 1998. Due to past history of smoking, she was also noted to have lung nodules. She is undergoing active surveillance imaging study of the chest.  Patient noted to have adrenal mass on CT chest which prompted PET CT in July 2018 which showed:IMPRESSION: 1. Right adrenal mass with peripheral hypermetabolism. Given interval development since 11/18/2015, suspicious for either isolated metastasis or a primary adrenal neoplasm. This should be considered for biopsy. 2. No evidence of hypermetabolic primary malignancy or extraadrenal metastasis. Pulmonary nodules are not significantly hypermetabolic. 3. Coronary artery atherosclerosis. Aortic Atherosclerosis (ICD10-I70.0). 4. Hepatic steatosis. 5. Left sixth rib hypermetabolism is favored to be related to remote Trauma.  Patient underwent right adrenalectomy which showed:DIAGNOSIS:  A. ADRENAL GLAND, RIGHT; ADRENALECTOMY:  - HIGH GRADE CARCINOMA WITH EXTENSIVE NECROSIS.  - MARGINS OF THE SPECIMEN ARE NEGATIVE FOR CARCINOMA.   Comment:  Sections demonstrate abundant necrosis confined to the adrenal medullary  compartment. In one block of tissue a small fragment of viable malignant  neoplasm was identified. A panel of immunohistochemical stains was  performed with the following pattern of immunoreactivity:  Super  pancytokeratin: Positive  GATA-3: Negative  TTF-1: Negative  P40: Negative  Napsin: Negative  PAX-8: Negative  Chromogranin: Negative   Repeat CT chest abdomen and pelvis in July 2019 showed stable left  lower lobe pulmonary nodule. No other evidence of metastatic disease. New 4.5 mm nodule in the right breast. This was followed by ultrasound mammogram and core biopsy of the breast lesion which was a grade 3 triple negative breast cancer. Patient was seen by Dr. Bary Castilla and underwent lumpectomy on 12/08/2017.  Patient had a 5 mm weakly ER positive tumor.  Given her ongoing fatigue Oncotype testing was done to see if she could potentially avoid chemotherapy.  Oncotype shows a recurrence score of 53 with her risk of distant recurrence at 9 years at greater than 39% with hormone therapy alone.  Absolute benefit of chemotherapy was greater than 15% in her age group.  Interval history-reports that her fatigue is better after she started using ginseng.  Neurontin has been helping with her lumpectomy pain.  ECOG PS- 1 Pain scale- 0 Opioid associated constipation- no  Review of systems- Review of Systems  Constitutional: Positive for malaise/fatigue. Negative for chills, fever and weight loss.  HENT: Negative for congestion, ear discharge and nosebleeds.   Eyes: Negative for blurred vision.  Respiratory: Negative for cough, hemoptysis, sputum production, shortness of breath and wheezing.   Cardiovascular: Negative for chest pain, palpitations, orthopnea and claudication.  Gastrointestinal: Negative for abdominal pain, blood in stool, constipation, diarrhea, heartburn, melena, nausea and vomiting.  Genitourinary: Negative for dysuria, flank pain, frequency, hematuria and urgency.  Musculoskeletal: Negative for back pain, joint pain and myalgias.  Skin: Negative for rash.  Neurological: Negative for dizziness, tingling, focal weakness, seizures, weakness and headaches.  Endo/Heme/Allergies: Does not bruise/bleed easily.  Psychiatric/Behavioral: Negative for depression and suicidal ideas. The patient does not have insomnia.        No Known Allergies   Past Medical History:  Diagnosis Date  .  Arthritis    KNEE RIGHT  . Breast cancer [facility]) 1997   right breast lumpectomy with radiation  . Breast cancer ([place]) 11/15/2017   INVASIVE MAMMARY CARCINOMA/ triple negative  . 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[employer]) 11/10/2014  . Cough 03/02/2017   INTERMTTENT DRY COUGH  . GERD (gastroesophageal reflux disease)   . History of hiatal hernia   . Personal history of radiation therapy   . Skin cancer   . Throat cancer [facility]) 1998   radiation     Past Surgical History:  Procedure Laterality Date  . BREAST BIOPSY Right 1997   positive  . BREAST BIOPSY Right 11/15/2017   INVASIVE MAMMARY CARCINOMA triple negative  . BREAST LUMPECTOMY Right 1997  . BREAST LUMPECTOMY WITH SENTINEL LYMPH NODE BIOPSY Right 12/08/2017   Procedure: BREAST LUMPECTOMY WITH SENTINEL LYMPH NODE BX;  Surgeon: Robert Bellow, MD;  Location: [facility] ORS;  Service: General;  Laterality: Right;  . COLONOSCOPY  2015  . ESOPHAGUS SURGERY    . KNEE SURGERY Right   . PORTACATH PLACEMENT Right 01/17/2018   Procedure: INSERTION PORT-A-CATH- RIGHT;  Surgeon: Robert Bellow, MD;  Location: [facility] ORS;  Service: General;  Laterality: Right;  . ROBOTIC ADRENALECTOMY Right 12/28/2016   Procedure: ROBOTIC ADRENALECTOMY;  Surgeon: Hollice Espy, MD;  Location: [facility] ORS;  Service: Urology;  Laterality: Right;  . SKIN CANCER EXCISION    . THROAT SURGERY  1998   throat cancer   . TUBAL LIGATION    . VENTRAL HERNIA REPAIR N/A  03/03/2017   Procedure: HERNIA REPAIR VENTRAL ADULT;  Surgeon: Clayburn Pert, MD;  Location: [facility] ORS;  Service: General;  Laterality: N/A;    Social History   Socioeconomic History  . Marital status: Married    Spouse name: Not on file  . Number of children: Not on file  . Years of education: Not on file  . Highest education level: Not on file  Occupational History  . Not on file  Social Needs  . Financial resource strain: Not on file  . Food insecurity:    Worry: Not on file    Inability: Not on file  .  Transportation needs:    Medical: Not on file    Non-medical: Not on file  Tobacco Use  . Smoking status: Former Smoker    Packs/day: 1.50    Years: 30.00    Pack years: 45.00    Types: Cigarettes    Last attempt to quit: 05/02/1996    Years since quitting: 21.7  . Smokeless tobacco: Never Used  Substance and Sexual Activity  . Alcohol use: Yes    Alcohol/week: 0.0 standard drinks    Comment: OCC  . Drug use: No  . Sexual activity: Not on file  Lifestyle  . Physical activity:    Days per week: Not on file    Minutes per session: Not on file  . Stress: Not on file  Relationships  . Social connections:    Talks on phone: Not on file    Gets together: Not on file    Attends religious service: Not on file    Active member of club or organization: Not on file    Attends meetings of clubs or organizations: Not on file    Relationship status: Not on file  . Intimate partner violence:    Fear of current or ex partner: Not on file    Emotionally abused: Not on file    Physically abused: Not on file    Forced sexual activity: Not on file  Other Topics Concern  . Not on file  Social History Narrative  . Not on file    Family History  Problem Relation Age of Onset  . Diabetes Mother   . Heart attack Mother   . Cancer Sister        cancer of eyes, spread to lung, other places. died at 62  . Cancer Sister 84       unknown primary, spread to bone, brain died in 57's  . Cancer Paternal Aunt        unk type  . Cancer Paternal Grandmother        type unk  . Prostate cancer Neg Hx   . Kidney cancer Neg Hx   . Bladder Cancer Neg Hx      Current Outpatient Medications:  .  Cyanocobalamin (B-12 COMPLIANCE INJECTION IJ), Inject 1 Dose as directed every 30 (thirty) days., Disp: , Rfl:  .  dexamethasone (DECADRON) 4 MG tablet, Take 2 tablets (8 mg total) by mouth 2 (two) times daily. Start the day before Taxotere. Then again the day after chemo for 3 days. (Patient not taking:  Reported on 01/17/2018), Disp: 30 tablet, Rfl: 1 .  gabapentin (NEURONTIN) 300 MG capsule, Take 1 capsule (300 mg total) by mouth 3 (three) times daily., Disp: 90 capsule, Rfl: 0 .  HYDROcodone-acetaminophen (NORCO/VICODIN) 5-325 MG tablet, Take 1 tablet by mouth every 4 (four) hours as needed for moderate pain. (Patient taking differently: Take 0.5  tablets by mouth every 4 (four) hours as needed for moderate pain. ), Disp: 20 tablet, Rfl: 0 .  lidocaine-prilocaine (EMLA) cream, Apply 1 application topically as needed. (Patient not taking: Reported on 01/17/2018), Disp: 30 g, Rfl: 0 .  LORazepam (ATIVAN) 0.5 MG tablet, Take 1 tablet (0.5 mg total) by mouth every 6 (six) hours as needed (Nausea or vomiting). (Patient not taking: Reported on 01/17/2018), Disp: 30 tablet, Rfl: 0 .  naproxen sodium (ALEVE) 220 MG tablet, Take 220 mg by mouth daily as needed (pain). , Disp: , Rfl:  .  omeprazole (PRILOSEC) 40 MG capsule, TAKE 1 CAPSULE BY MOUTH EVERY DAY-AM, Disp: 30 capsule, Rfl: 11 .  ondansetron (ZOFRAN) 8 MG tablet, Take 1 tablet (8 mg total) by mouth 2 (two) times daily as needed for refractory nausea / vomiting. Start on day 3 after chemo. (Patient not taking: Reported on 01/17/2018), Disp: 30 tablet, Rfl: 1 .  prochlorperazine (COMPAZINE) 10 MG tablet, Take 1 tablet (10 mg total) by mouth every 6 (six) hours as needed (Nausea or vomiting). (Patient not taking: Reported on 01/17/2018), Disp: 30 tablet, Rfl: 1 No current facility-administered medications for this visit.   Facility-Administered Medications Ordered in Other Visits:  .  heparin lock flush 100 unit/mL, 500 Units, Intravenous, Once, Sindy Guadeloupe, MD  Physical exam:  Vitals:   01/30/18 0836  BP: 120/80  Pulse: 75  Resp: 18  Temp: 98.8 F (37.1 C)  TempSrc: Tympanic  SpO2: 97%  Weight: 142 lb 6.4 oz (64.6 kg)  Height: 5' 1"  (1.549 m)   Physical Exam  Constitutional: She is oriented to person, place, and time. She appears  well-developed and well-nourished.  HENT:  Head: Normocephalic and atraumatic.  Angular cheilitis  Eyes: Pupils are equal, round, and reactive to light. EOM are normal.  Neck: Normal range of motion.  Cardiovascular: Normal rate, regular rhythm and normal heart sounds.  Pulmonary/Chest: Effort normal and breath sounds normal.  Abdominal: Soft. Bowel sounds are normal.  Neurological: She is alert and oriented to person, place, and time.  Skin: Skin is warm and dry.     CMP Latest Ref Rng & Units 12/27/2017  Glucose 70 - 99 mg/dL 100(H)  BUN 8 - 23 mg/dL 10  Creatinine 0.44 - 1.00 mg/dL 0.71  Sodium 135 - 145 mmol/L 144  Potassium 3.5 - 5.1 mmol/L 4.4  Chloride 98 - 111 mmol/L 107  CO2 22 - 32 mmol/L 29  Calcium 8.9 - 10.3 mg/dL 9.4  Total Protein 6.5 - 8.1 g/dL 6.6  Total Bilirubin 0.3 - 1.2 mg/dL 0.7  Alkaline Phos 38 - 126 U/L 60  AST 15 - 41 U/L 25  ALT 0 - 44 U/L 14   CBC Latest Ref Rng & Units 01/30/2018  WBC 3.6 - 11.0 K/uL 4.9  Hemoglobin 12.0 - 16.0 g/dL 13.7  Hematocrit 35.0 - 47.0 % 40.1  Platelets 150 - 440 K/uL 182    No images are attached to the encounter.  Dg Chest Port 1 View  Result Date: 01/17/2018 CLINICAL DATA:  62 year old female status post Port-A-Cath placement. EXAM: PORTABLE CHEST 1 VIEW COMPARISON:  Chest CT 11/06/2017 and earlier. FINDINGS: Portable AP upright view at 1307 hours. A right chest IJ approach power port type porta cath is in place. The superior most extent of the catheter tubing is not included on this portable image. The catheter tip projects at the cavoatrial junction level. No pneumothorax. Mediastinal contours remain normal. Lower lung volumes. No  pulmonary edema, pleural effusion or confluent pulmonary opacity. Calcified aortic atherosclerosis. Negative visible bowel gas pattern. No acute osseous abnormality identified. IMPRESSION: 1. Right IJ approach power port placed with no adverse features identified, but note that the superior  most extent of the catheter tubing was not included. 2.  No acute cardiopulmonary abnormality. Electronically Signed   By: Genevie Ann M.D.   On: 01/17/2018 13:41   Dg C-arm 1-60 Min-no Report  Result Date: 01/17/2018 Fluoroscopy was utilized by the requesting physician.  No radiographic interpretation.     Assessment and plan- Patient is a 62 y.o. 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 Final path showed 5 mm grade 3 Er weakly positive 1-10%, pr negative and her 2 negative tumor.    She is here for on treatment assessment prior to cycle 1 of adjuvant TC chemotherapy  Counts okay to proceed with cycle 1 of TC chemotherapy today with on pro-Neulasta support.  Again discussed risks and benefits of chemotherapy including all but not limited to nausea, vomiting, fatigue, risk of infections and hospitalization.  Hair loss and peripheral neuropathy associated with Taxotere.  Patient understands and agrees to proceed as planned.  I will see her back in 10 days time with a BMP for possible IV fluids  She will continue taking American ginseng for her fatigue  She does have evidence of B12 deficiency and has received weekly B12 shots in the past.  She will be getting her next dose of B12 today and with each chemotherapy   Visit Diagnosis 1. Malignant neoplasm of lower-outer quadrant of right breast of female, estrogen receptor negative ([place])   2. Encounter for antineoplastic chemotherapy   3. B12 deficiency      Dr. Randa Evens, MD, MPH [facility] at [facility] [phone] 01/30/2018 9:01 AM

## 2018-01-30 NOTE — Progress Notes (Signed)
Pt tolerated infusion well. Pt stable at discharge. 

## 2018-01-31 ENCOUNTER — Telehealth: Payer: Self-pay | Admitting: Genetics

## 2018-02-01 ENCOUNTER — Encounter: Payer: Self-pay | Admitting: Genetics

## 2018-02-01 ENCOUNTER — Ambulatory Visit: Payer: Self-pay | Admitting: Genetics

## 2018-02-01 DIAGNOSIS — Z1379 Encounter for other screening for genetic and chromosomal anomalies: Secondary | ICD-10-CM

## 2018-02-01 DIAGNOSIS — C50511 Malignant neoplasm of lower-outer quadrant of right female breast: Secondary | ICD-10-CM

## 2018-02-01 DIAGNOSIS — Z85819 Personal history of malignant neoplasm of unspecified site of lip, oral cavity, and pharynx: Secondary | ICD-10-CM

## 2018-02-01 DIAGNOSIS — Z171 Estrogen receptor negative status [ER-]: Secondary | ICD-10-CM

## 2018-02-01 DIAGNOSIS — Z853 Personal history of malignant neoplasm of breast: Secondary | ICD-10-CM

## 2018-02-01 DIAGNOSIS — C7491 Malignant neoplasm of unspecified part of right adrenal gland: Secondary | ICD-10-CM

## 2018-02-01 NOTE — Progress Notes (Signed)
HPI:  Denise Watts was previously seen in the [facility] clinic on 01/18/2018 due to a personal and family history of cancer and concerns regarding a hereditary predisposition to cancer. Please refer to our prior cancer genetics clinic note for more information regarding Denise Watts's medical, social and family histories, and our assessment and recommendations, at the time. Denise Watts recent genetic test results were disclosed to her, as well as recommendations warranted by these results. These results and recommendations are discussed in more detail below.  CANCER HISTORY:  Oncology History   1. Carcinoma of breast, T1, N0, M0 tumor diagnosis.  In January of 1997 2. Carcinoma of the vocal cord T1, N0, M0 tumor.  Status postradiation therapy 3. Abnormal CT scan of the chest (December, 2015) 4. Repeat CT scan of chest shows stable nodule (July, 2016) 5. Neutropenia with normal hemoglobin and normal platelet count (July, 2016)     Cancer of vocal cord [facility])   11/10/2014 Initial Diagnosis    Cancer of vocal cord     Malignant neoplasm of lower-outer quadrant of right breast of female, estrogen receptor negative ([place])   11/24/2017 Initial Diagnosis    Malignant neoplasm of lower-outer quadrant of right breast of female, estrogen receptor negative ([place])    01/11/2018 -  Chemotherapy    The patient had palonosetron (ALOXI) injection 0.25 mg, 0.25 mg, Intravenous,  Once, 1 of 4 cycles Administration: 0.25 mg (01/30/2018) pegfilgrastim (NEULASTA ONPRO KIT) injection 6 mg, 6 mg, Subcutaneous, Once, 1 of 4 cycles Administration: 6 mg (01/30/2018) cyclophosphamide (CYTOXAN) 1,000 mg in sodium chloride 0.9 % 250 mL chemo infusion, 600 mg/m2 = 1,000 mg, Intravenous,  Once, 1 of 4 cycles Administration: 1,000 mg (01/30/2018) DOCEtaxel (TAXOTERE) 130 mg in sodium chloride 0.9 % 250 mL chemo infusion, 75 mg/m2 = 130 mg, Intravenous,  Once, 1 of 4 cycles Administration: 130 mg (01/30/2018)  for  chemotherapy treatment.     01/12/2018 Cancer Staging    Staging form: Breast, AJCC 8th Edition - Clinical stage from 01/12/2018: Stage IB (cT1a, cN0, cM0, G3, ER+, PR-, HER2-) - Signed by Sindy Guadeloupe, MD on 01/12/2018      FAMILY HISTORY:  We obtained a detailed, 4-generation family history.  Significant diagnoses are listed below: Family History  Problem Relation Age of Onset  . Diabetes Mother   . Heart attack Mother   . Cancer Sister        cancer of eyes, spread to lung, other places. died at 41  . Cancer Sister 64       unknown primary, spread to bone, brain died in 51's  . Cancer Paternal Aunt        unk type  . Cancer Paternal Grandmother        type unk  . Prostate cancer Neg Hx   . Kidney cancer Neg Hx   . Bladder Cancer Neg Hx     Denise Watts has 2 daughters (ages 69 and 55) and a son who is 76 with no history of cancer.  Denise Watts has 4 grandchildren. Denise Watts has 2 full sisters and 1 maternal half-sister: -1 full sister had cancer of her eyes this spread to lungs, other locations and she died at 22.  -1 full sister was first diagnosed with cancer in her 29's (origin/primary unk).  She fought this cancer off/on for 15 years-it came back I her brain, bones, other locations.  She died in her 2's.  -1 maternal half-sister is  71 with no history of cancer  Denise Watts father: died in his 45's due to vehicle accident Paternal Aunts/Uncles: several paternal aunts/uncles- limited information available about these relatives.  Denise Watts is aware of 1 paternal aunt who had cancer- type unk.  Paternal cousins: no known history of cancer, limited contact with these relatives.  Paternal grandfather: unk Paternal grandmother:diede of cancer- type unk  Denise Watts mother: died in her late 58's due to a heart attack Maternal Aunts/Uncles: 13 maternal aunts/uncles- diabetes and heart disease is frequent in these relatives.  No known history of cancer Maternal cousins: limited  information- no known cancerdx Maternal grandfather: no hx of cancer Maternal grandmother:died in her 70's/80's no hx of cancer she is aware of  Denise Watts is unaware of previous family history of genetic testing for hereditary cancer risks. Patient's maternal ancestors are of Caucasian/Native American descent, and paternal ancestors are of Caucasian descent. There is no reported Ashkenazi Jewish ancestry. There is no known consanguinity.   GENETIC TEST RESULTS: Genetic testing performed through [facility]'s Multi-Cancer Panel reported out on 01/25/2018 showed no pathogenic mutations. The Multi-Cancer Panel offered by [facility] includes sequencing and/or deletion duplication testing of the following 91 genes: AIP, ALK, APC, ATM, AXIN2, BAP1, BARD1, BLM, BMPR1A, BRCA1, BRCA2, BRIP1, BUB1B, CASR, CDC73, CDH1, CDK4, CDKN1B, CDKN1C, CDKN2A, CEBPA, CEP57, CHEK2, CTNNA1, DICER1, DIS3L2, EGFR, ENG, EPCAM, FH, FLCN, GALNT12, GATA2, GPC3, GREM1, HOXB13, HRAS, KIT, MAX, MEN1, MET, MITF, MLH1, MLH3, MSH2, MSH3, MSH6, MUTYH, NBN, NF1, NF2, NTHL1, PALB2, PDGFRA, PHOX2B, PMS2, POLD1, POLE, POT1, PRKAR1A, PTCH1, PTEN, RAD50, RAD51C, RAD51D, RB1, RECQL4, RET, RNF43, RPS20, RUNX1, SDHA, SDHAF2, SDHB, SDHC, SDHD, SMAD4, SMARCA4, SMARCB1, SMARCE1, STK11, SUFU, TERC, TERT, TMEM127, TP53, TSC1, TSC2, VHL, WRN, WT1  The test report will be scanned into EPIC and will be located under the Molecular Pathology section of the Results Review tab. A portion of the result report is included below for reference.     We discussed with Denise Watts that because current genetic testing is not perfect, it is possible there may be a gene mutation in one of these genes that current testing cannot detect, but that chance is small.  We also discussed, that there could be another gene that has not yet been discovered, or that we have not yet tested, that is responsible for the cancer diagnoses in the family. It is also possible there is a hereditary  cause for the cancer in the family that Denise Watts did not inherit and therefore was not identified in her testing.  Therefore, it is important to remain in touch with cancer genetics in the future so that we can continue to offer Denise Watts the most up to date genetic testing.   ADDITIONAL GENETIC TESTING:We discussed with Denise Watts that her genetic testing was fairly extensive.  If there are are genes identified to increase cancer risk that can be analyzed in the future, we would be happy to discuss and coordinate this testing at that time.    CANCER SCREENING RECOMMENDATIONS: This negative result means that we were unable to identify a hereditary cause for her personal and family history of cancer at this time.  This result does not rule out a hereditary cause for her  cancer.  It is still possible that there could be genetic mutations that are undetectable by current technology, or genetic mutations in genes that have not been tested or identified to increase cancer risk.  Denise Watts has had several different caners, which  is suspicious for a genetic cancer predisposition.  Therefore, this negative result could simply be a limitation if our current technology/knowledge.   An individual's cancer risk is not determined by genetic test results alone.  Overall cancer risk assessment includes additional factors such as personal medical history, family history, etc.  These should be used to make a personalized plan for cancer prevention and surveillance.    RECOMMENDATIONS FOR FAMILY MEMBERS:  Relatives in this family might be at some increased risk of developing cancer, over the general population risk, simply due to the family history of cancer.  We recommended women in this family have a yearly mammogram beginning at age 9, or 46 years younger than the earliest onset of cancer, an annual clinical breast exam, and perform monthly breast self-exams. Women in this family should also have a gynecological exam as  recommended by their primary provider. All family members should have a colonoscopy by age 10 (or as directed by their doctors).  All family members should inform their physicians about the family history of cancer so their doctors can make the most appropriate screening recommendations for them.   It is also possible there is a hereditary cause for the cancer in Denise Watts family that she did not inherit and therefore was not identified in her.    FOLLOW-UP: Lastly, we discussed with Denise Watts that cancer genetics is a rapidly advancing field and it is possible that new genetic tests will be appropriate for her and/or her family members in the future. We encouraged her to remain in contact with cancer genetics on an annual basis so we can update her personal and family histories and let her know of advances in cancer genetics that may benefit this family.   Our contact number was provided. Denise Watts questions were answered to her satisfaction, and she knows she is welcome to call us at anytime with additional questions or concerns.   Ferol Luz, MS, [facility] Certified Genetic Counselor [email or web address]

## 2018-02-01 NOTE — Telephone Encounter (Signed)
Revealed negative genetic testing.  This normal result indicates that we did not identify a genetic/hereditary cause for her cancers.  It is unlikely that there is an increased risk of another cancer due to a mutation in one of these genes.  However, genetic testing is not perfect, and cannot definitively rule out a hereditary cause.  It will be important for her to keep in contact with genetics to learn if any additional testing may be needed in the future.     We recommended her family tell their doctors about the family history so they can make the best screening recommendations for them.

## 2018-02-05 ENCOUNTER — Other Ambulatory Visit: Payer: Self-pay

## 2018-02-05 ENCOUNTER — Inpatient Hospital Stay (HOSPITAL_BASED_OUTPATIENT_CLINIC_OR_DEPARTMENT_OTHER): Payer: PRIVATE HEALTH INSURANCE | Admitting: Oncology

## 2018-02-05 ENCOUNTER — Telehealth: Payer: Self-pay | Admitting: *Deleted

## 2018-02-05 ENCOUNTER — Inpatient Hospital Stay: Payer: PRIVATE HEALTH INSURANCE

## 2018-02-05 DIAGNOSIS — C320 Malignant neoplasm of glottis: Secondary | ICD-10-CM

## 2018-02-05 DIAGNOSIS — R630 Anorexia: Secondary | ICD-10-CM

## 2018-02-05 DIAGNOSIS — Z5111 Encounter for antineoplastic chemotherapy: Secondary | ICD-10-CM | POA: Diagnosis not present

## 2018-02-05 DIAGNOSIS — I2510 Atherosclerotic heart disease of native coronary artery without angina pectoris: Secondary | ICD-10-CM

## 2018-02-05 DIAGNOSIS — Z853 Personal history of malignant neoplasm of breast: Secondary | ICD-10-CM

## 2018-02-05 DIAGNOSIS — R4702 Dysphasia: Secondary | ICD-10-CM

## 2018-02-05 DIAGNOSIS — Z923 Personal history of irradiation: Secondary | ICD-10-CM

## 2018-02-05 DIAGNOSIS — Z8521 Personal history of malignant neoplasm of larynx: Secondary | ICD-10-CM

## 2018-02-05 DIAGNOSIS — M545 Low back pain, unspecified: Secondary | ICD-10-CM

## 2018-02-05 DIAGNOSIS — R109 Unspecified abdominal pain: Secondary | ICD-10-CM

## 2018-02-05 DIAGNOSIS — B370 Candidal stomatitis: Secondary | ICD-10-CM

## 2018-02-05 DIAGNOSIS — F418 Other specified anxiety disorders: Secondary | ICD-10-CM

## 2018-02-05 DIAGNOSIS — R5381 Other malaise: Secondary | ICD-10-CM

## 2018-02-05 DIAGNOSIS — C7971 Secondary malignant neoplasm of right adrenal gland: Secondary | ICD-10-CM

## 2018-02-05 DIAGNOSIS — Z87891 Personal history of nicotine dependence: Secondary | ICD-10-CM

## 2018-02-05 DIAGNOSIS — K760 Fatty (change of) liver, not elsewhere classified: Secondary | ICD-10-CM

## 2018-02-05 DIAGNOSIS — M1990 Unspecified osteoarthritis, unspecified site: Secondary | ICD-10-CM

## 2018-02-05 DIAGNOSIS — K219 Gastro-esophageal reflux disease without esophagitis: Secondary | ICD-10-CM

## 2018-02-05 DIAGNOSIS — I700 Atherosclerosis of aorta: Secondary | ICD-10-CM

## 2018-02-05 DIAGNOSIS — R531 Weakness: Secondary | ICD-10-CM

## 2018-02-05 DIAGNOSIS — K1230 Oral mucositis (ulcerative), unspecified: Secondary | ICD-10-CM

## 2018-02-05 DIAGNOSIS — R110 Nausea: Secondary | ICD-10-CM

## 2018-02-05 DIAGNOSIS — R918 Other nonspecific abnormal finding of lung field: Secondary | ICD-10-CM

## 2018-02-05 DIAGNOSIS — C50511 Malignant neoplasm of lower-outer quadrant of right female breast: Secondary | ICD-10-CM

## 2018-02-05 DIAGNOSIS — E860 Dehydration: Secondary | ICD-10-CM

## 2018-02-05 DIAGNOSIS — Z79899 Other long term (current) drug therapy: Secondary | ICD-10-CM

## 2018-02-05 DIAGNOSIS — R42 Dizziness and giddiness: Secondary | ICD-10-CM

## 2018-02-05 DIAGNOSIS — Z85828 Personal history of other malignant neoplasm of skin: Secondary | ICD-10-CM

## 2018-02-05 DIAGNOSIS — R5383 Other fatigue: Secondary | ICD-10-CM

## 2018-02-05 DIAGNOSIS — Z171 Estrogen receptor negative status [ER-]: Secondary | ICD-10-CM

## 2018-02-05 DIAGNOSIS — R197 Diarrhea, unspecified: Secondary | ICD-10-CM

## 2018-02-05 DIAGNOSIS — C801 Malignant (primary) neoplasm, unspecified: Secondary | ICD-10-CM

## 2018-02-05 DIAGNOSIS — Z95828 Presence of other vascular implants and grafts: Secondary | ICD-10-CM

## 2018-02-05 DIAGNOSIS — E538 Deficiency of other specified B group vitamins: Secondary | ICD-10-CM

## 2018-02-05 DIAGNOSIS — R1310 Dysphagia, unspecified: Secondary | ICD-10-CM

## 2018-02-05 LAB — CBC WITH DIFFERENTIAL/PLATELET
BASOS ABS: 0 10*3/uL (ref 0–0.1)
BASOS PCT: 1 %
EOS PCT: 2 %
Eosinophils Absolute: 0.1 10*3/uL (ref 0–0.7)
HCT: 42 % (ref 35.0–47.0)
Hemoglobin: 14.6 g/dL (ref 12.0–16.0)
Lymphocytes Relative: 15 %
Lymphs Abs: 0.5 10*3/uL — ABNORMAL LOW (ref 1.0–3.6)
MCH: 33.9 pg (ref 26.0–34.0)
MCHC: 34.8 g/dL (ref 32.0–36.0)
MCV: 97.5 fL (ref 80.0–100.0)
Monocytes Absolute: 0 10*3/uL — ABNORMAL LOW (ref 0.2–0.9)
Monocytes Relative: 0 %
NEUTROS PCT: 82 %
Neutro Abs: 2.9 10*3/uL (ref 1.4–6.5)
PLATELETS: 116 10*3/uL — AB (ref 150–440)
RBC: 4.31 MIL/uL (ref 3.80–5.20)
RDW: 12.2 % (ref 11.5–14.5)
WBC: 3.5 10*3/uL — ABNORMAL LOW (ref 3.6–11.0)

## 2018-02-05 LAB — COMPREHENSIVE METABOLIC PANEL
ALT: 16 U/L (ref 0–44)
ANION GAP: 9 (ref 5–15)
AST: 26 U/L (ref 15–41)
Albumin: 3.7 g/dL (ref 3.5–5.0)
Alkaline Phosphatase: 63 U/L (ref 38–126)
BILIRUBIN TOTAL: 0.8 mg/dL (ref 0.3–1.2)
BUN: 23 mg/dL (ref 8–23)
CALCIUM: 8.7 mg/dL — AB (ref 8.9–10.3)
CO2: 25 mmol/L (ref 22–32)
Chloride: 103 mmol/L (ref 98–111)
Creatinine, Ser: 0.71 mg/dL (ref 0.44–1.00)
GFR calc Af Amer: >60 mL/min (ref >60)
GFR calc non Af Amer: >60 mL/min (ref >60)
GLUCOSE: 93 mg/dL (ref 70–99)
POTASSIUM: 3.7 mmol/L (ref 3.5–5.1)
Sodium: 137 mmol/L (ref 135–145)
TOTAL PROTEIN: 6.2 g/dL — AB (ref 6.5–8.1)

## 2018-02-05 LAB — URINALYSIS, COMPLETE (UACMP) WITH MICROSCOPIC
Bilirubin Urine: NEGATIVE
Glucose, UA: NEGATIVE mg/dL
Hgb urine dipstick: NEGATIVE
Ketones, ur: NEGATIVE mg/dL
Leukocytes, UA: NEGATIVE
Nitrite: NEGATIVE
PROTEIN: NEGATIVE mg/dL
Specific Gravity, Urine: 1.023 (ref 1.005–1.030)
pH: 6 (ref 5.0–8.0)

## 2018-02-05 LAB — MAGNESIUM: Magnesium: 2.1 mg/dL (ref 1.7–2.4)

## 2018-02-05 MED ORDER — CIPROFLOXACIN HCL 500 MG PO TABS: 500.0000 mg | ORAL_TABLET | Freq: Two times a day (BID) | ORAL | 0 refills | Status: DC

## 2018-02-05 MED ORDER — TRAMADOL HCL 50 MG PO TABS: 50.0000 mg | ORAL_TABLET | Freq: Four times a day (QID) | ORAL | 0 refills | Status: DC | PRN

## 2018-02-05 MED ORDER — MORPHINE SULFATE 2 MG/ML IJ SOLN
2.0000 mg | Freq: Once | INTRAMUSCULAR | Status: AC
  Administered 2018-02-05: 2 mg via INTRAVENOUS
  Filled 2018-02-05 (×2): qty 1

## 2018-02-05 MED ORDER — SODIUM CHLORIDE 0.9 % IV SOLN: Freq: Once | INTRAVENOUS | Status: DC

## 2018-02-05 MED ORDER — HEPARIN SOD (PORK) LOCK FLUSH 100 UNIT/ML IV SOLN
500.0000 [IU] | Freq: Once | INTRAVENOUS | Status: AC
  Administered 2018-02-05: 500 [IU] via INTRAVENOUS

## 2018-02-05 MED ORDER — ONDANSETRON HCL 4 MG/2ML IJ SOLN
8.0000 mg | Freq: Once | INTRAMUSCULAR | Status: AC
  Administered 2018-02-05: 8 mg via INTRAVENOUS
  Filled 2018-02-05: qty 4

## 2018-02-05 MED ORDER — ONDANSETRON 8 MG PO TBDP: 8.0000 mg | ORAL_TABLET | Freq: Three times a day (TID) | ORAL | 0 refills | Status: DC | PRN

## 2018-02-05 MED ORDER — SODIUM CHLORIDE 0.9% FLUSH
10.0000 mL | INTRAVENOUS | Status: DC | PRN
  Administered 2018-02-05: 10 mL via INTRAVENOUS
  Filled 2018-02-05: qty 10

## 2018-02-05 MED ORDER — MAGIC MOUTHWASH W/LIDOCAINE: 5.0000 mL | Freq: Four times a day (QID) | ORAL | 0 refills | Status: DC

## 2018-02-05 MED ORDER — SODIUM CHLORIDE 0.9 % IV SOLN
INTRAVENOUS | Status: DC
  Filled 2018-02-05: qty 250

## 2018-02-05 MED ORDER — SODIUM CHLORIDE 0.9 % IV SOLN
Freq: Once | INTRAVENOUS | Status: AC
  Administered 2018-02-05: 14:00:00 via INTRAVENOUS
  Filled 2018-02-05: qty 250

## 2018-02-05 NOTE — Progress Notes (Signed)
Symptom Management Consult note [facility]  Telephone:([phone] [phone]  Patient Care Team: Chrismon, Vickki Muff, PA as PCP - General (Physician Assistant)   Name of the patient: Denise Watts  [id number]  01-May-1956   Date of visit: 02/05/2018  Diagnosis: Breast cancer  Chief Complaint: Mouth Sores, Dysphagia, weakness  Current Treatment: S/p cycle 1 of adjuvant TC chemotherapy   Oncology History: Patient was last seen by primary oncologist Dr. Janese Banks on 01/30/2018 prior to adjuvant TC chemotherapy and neulasta support.  She reported that her fatigue had improved after starting to use [place] ginseng.  Neurontin was helping her lumpectomy pain.  Counts were okay for cycle 1.   Oncology History   1. Carcinoma of breast, T1, N0, M0 tumor diagnosis.  In January of 1997 2. Carcinoma of the vocal cord T1, N0, M0 tumor.  Status postradiation therapy 3. Abnormal CT scan of the chest (December, 2015) 4. Repeat CT scan of chest shows stable nodule (July, 2016) 5. Neutropenia with normal hemoglobin and normal platelet count (July, 2016)     Cancer of vocal cord [facility])   11/10/2014 Initial Diagnosis    Cancer of vocal cord     Malignant neoplasm of lower-outer quadrant of right breast of female, estrogen receptor negative ([place])   11/24/2017 Initial Diagnosis    Malignant neoplasm of lower-outer quadrant of right breast of female, estrogen receptor negative ([place])    01/11/2018 -  Chemotherapy    The patient had palonosetron (ALOXI) injection 0.25 mg, 0.25 mg, Intravenous,  Once, 1 of 4 cycles Administration: 0.25 mg (01/30/2018) pegfilgrastim (NEULASTA ONPRO KIT) injection 6 mg, 6 mg, Subcutaneous, Once, 1 of 4 cycles Administration: 6 mg (01/30/2018) cyclophosphamide (CYTOXAN) 1,000 mg in sodium chloride 0.9 % 250 mL chemo infusion, 600 mg/m2 = 1,000 mg, Intravenous,  Once, 1 of 4 cycles Administration: 1,000 mg (01/30/2018) DOCEtaxel (TAXOTERE) 130 mg in  sodium chloride 0.9 % 250 mL chemo infusion, 75 mg/m2 = 130 mg, Intravenous,  Once, 1 of 4 cycles Administration: 130 mg (01/30/2018)  for chemotherapy treatment.     01/12/2018 Cancer Staging    Staging form: Breast, AJCC 8th Edition - Clinical stage from 01/12/2018: Stage IB (cT1a, cN0, cM0, G3, ER+, PR-, HER2-) - Signed by Sindy Guadeloupe, MD on 01/12/2018     Subjective Data: Subjective:    Denise Watts is a 62 y.o. female who presents for evaluation of low back pain/flank pain. The patient has had no prior back problems. Symptoms have been present for 2 days and are gradually worsening.  Onset was related to / precipitated by no known injury. The pain is located in the across the lower back and radiates to the right hip, left hip. The pain is described as aching and stabbing and occurs intermittently. She rates her pain as a 8 on a scale of 0-10. Symptoms are exacerbated by nothing in particular. Symptoms are improved by narcotic pain medications. She has also tried narcotic pain medications and NSAIDs which provided some limited relief. She has no other symptoms associated with the back pain. History indicative of potentially complicated back pain includes a history of cancer (breast, throat cancer ) and a history of immunosupression (recent chemotherapy).  Patient also complains of dysphasia and mouth sores.  States both began on Friday and has progressively become worse.  States at this point, she is unable to swallow liquids or even soft foods without significant nausea with intermittent vomiting.  Has poor oral  intake. Feels weak and dizzy when rising from sitting to standing position. Husband is worried she may fall. Notes decreased urine output but denies other urinary symptoms.  The following portions of the patient's history were reviewed and updated as appropriate: allergies, current medications, past family history, past medical history, past social history, past surgical history and  problem list.  Review of Systems A comprehensive review of systems was negative except for: Constitutional: positive for fatigue and malaise Ears, nose, mouth, throat, and face: positive for sore mouth, sore throat and dysphagia Respiratory: positive for dyspnea on exertion Musculoskeletal: positive for back pain, muscle weakness and myalgias Neurological: positive for dizziness, gait problems and weakness Behavioral/Psych: positive for anxiety and depression    Objective:   Inspection and palpation: inspection of back is normal. Muscle tone and ROM exam: limited range of motion with pain. Neurological: normal DTRs, muscle strength and reflexes.    Assessment:    Flank pain d/t unknown etiology. Will r/o UTI.    Thrush/mucocitis: d/t recent chemo, poor oral intake an hygiene  Plan:    Neurosurgeon distributed.    Breast cancer: Initial diagnosis of right breast cancer 1997. S/p surgery and radiation.  No chemotherapy or hormone therapy.  New 4.5 mm nodule in the right breast found incidentally from CT chest and abdomen/pelvis for follow-up of pulmonary nodule.  Ultrasound and mammogram with core biopsy revealed grade 3 triple negative breast cancer.  Had lumpectomy by Dr. Tollie Pizza on 12/08/2017.  She is s/p cycle 1 TC chemotherapy.  Last given on 01/30/2018.  She is scheduled to return to clinic 02/08/2018 for labs and possible IV fluids.  Carcinoma of the vocal cord: Status post radiation therapy and extensive surgery and radiation in 1998.  Lung nodules: Has history of smoking.  Under surveillance imaging with CT scans of the chest yearly.  Most recent imaging from July 2019 showed stable left lower lobe pulmonary nodules.  Adrenal mass with peripheral hypermetabolism: Found on CT chest which prompted a PET scan in July 2018.  It was suspicious for isolated metastasis or primary adrenal neoplasm.  Biopsy revealed high-grade carcinoma.  Had adrenalectomy in August 2018.  Under  active surveillance due to unclear etiology of tumor.  Thrush d/t recent chemotherapy/mucositis: Obvious white plaques coating tongue, buccal mucosa, palate and oropharynx. RX Magic mouthwash with lidocaine faxed to pharmacy.  Instructed to use 4 times daily.  We will also try viscous lidocaine to sores in mouth.   Nausea/dysphasia: Unable to tolerate and swallow most pills. RX Zofran ODT.  Instructed to take around-the-clock.  Flank pain: No CVA tenderness.  Low midline back pain; lumbar.  Will rule out UTI.  Urinalysis negative for urinary tract infection.  Given acute onset of symptoms and severity, Dr. Janese Banks recommended beginning antibiotics with reassessment tomorrow. RX Cipro 500 mg twice daily for 7 days. RX tramadol 50 mg every 6 hours for pain.  Previously prescribed Norco and hydrocodone but states she is sensitive in both medications make her drowsy.  Would prefer something less sedating.  Dehydration: d/t dysphasia.  Will give 1.5 L of NaCl in clinic today along with 8 mg Zofran.  Labs are stable.   Addendum: Patient unable to swallow antibiotic d/t size of pill.  Told her to hold antibiotic until reassessed tomorrow.  Greater than 50% was spent in counseling and coordination of care with this patient including but not limited to discussion of the relevant topics above (See A&P) including, but not limited to diagnosis  and management of acute and chronic medical conditions.   Faythe Casa, NP 02/08/2018 11:52 AM

## 2018-02-05 NOTE — Telephone Encounter (Signed)
CAll received on behalf of patient that she has "spots in her mouth and her throat is very sore, she cannot talk or drink"  Discussed with Symptom Management Clinic NP and appointment accepted for 1PM today for lab, NP, IV fluids.

## 2018-02-06 ENCOUNTER — Inpatient Hospital Stay: Payer: PRIVATE HEALTH INSURANCE

## 2018-02-06 ENCOUNTER — Inpatient Hospital Stay (HOSPITAL_BASED_OUTPATIENT_CLINIC_OR_DEPARTMENT_OTHER): Payer: PRIVATE HEALTH INSURANCE | Admitting: Oncology

## 2018-02-06 VITALS — BP 114/73 | HR 71 | Temp 99.6°F | Resp 20

## 2018-02-06 DIAGNOSIS — C7971 Secondary malignant neoplasm of right adrenal gland: Secondary | ICD-10-CM

## 2018-02-06 DIAGNOSIS — Z923 Personal history of irradiation: Secondary | ICD-10-CM

## 2018-02-06 DIAGNOSIS — E860 Dehydration: Secondary | ICD-10-CM

## 2018-02-06 DIAGNOSIS — C801 Malignant (primary) neoplasm, unspecified: Secondary | ICD-10-CM

## 2018-02-06 DIAGNOSIS — R110 Nausea: Secondary | ICD-10-CM

## 2018-02-06 DIAGNOSIS — M545 Low back pain, unspecified: Secondary | ICD-10-CM

## 2018-02-06 DIAGNOSIS — Z95828 Presence of other vascular implants and grafts: Secondary | ICD-10-CM

## 2018-02-06 DIAGNOSIS — R4702 Dysphasia: Secondary | ICD-10-CM

## 2018-02-06 DIAGNOSIS — Z5111 Encounter for antineoplastic chemotherapy: Secondary | ICD-10-CM | POA: Diagnosis not present

## 2018-02-06 DIAGNOSIS — C320 Malignant neoplasm of glottis: Secondary | ICD-10-CM

## 2018-02-06 DIAGNOSIS — Z171 Estrogen receptor negative status [ER-]: Secondary | ICD-10-CM

## 2018-02-06 DIAGNOSIS — R5383 Other fatigue: Secondary | ICD-10-CM

## 2018-02-06 DIAGNOSIS — C50511 Malignant neoplasm of lower-outer quadrant of right female breast: Secondary | ICD-10-CM | POA: Diagnosis not present

## 2018-02-06 DIAGNOSIS — E538 Deficiency of other specified B group vitamins: Secondary | ICD-10-CM

## 2018-02-06 DIAGNOSIS — R531 Weakness: Secondary | ICD-10-CM

## 2018-02-06 DIAGNOSIS — Z8521 Personal history of malignant neoplasm of larynx: Secondary | ICD-10-CM

## 2018-02-06 DIAGNOSIS — B370 Candidal stomatitis: Secondary | ICD-10-CM

## 2018-02-06 DIAGNOSIS — K1230 Oral mucositis (ulcerative), unspecified: Secondary | ICD-10-CM

## 2018-02-06 DIAGNOSIS — G893 Neoplasm related pain (acute) (chronic): Secondary | ICD-10-CM

## 2018-02-06 DIAGNOSIS — R1310 Dysphagia, unspecified: Secondary | ICD-10-CM

## 2018-02-06 DIAGNOSIS — Z853 Personal history of malignant neoplasm of breast: Secondary | ICD-10-CM

## 2018-02-06 MED ORDER — SODIUM CHLORIDE 0.9 % IV SOLN
Freq: Once | INTRAVENOUS | Status: AC
  Administered 2018-02-06: 10:00:00 via INTRAVENOUS
  Filled 2018-02-06: qty 250

## 2018-02-06 MED ORDER — FLUCONAZOLE IN SODIUM CHLORIDE 400-0.9 MG/200ML-% IV SOLN
400.0000 mg | Freq: Once | INTRAVENOUS | Status: AC
  Administered 2018-02-06: 400 mg via INTRAVENOUS
  Filled 2018-02-06: qty 200

## 2018-02-06 MED ORDER — SODIUM CHLORIDE 0.9% FLUSH
10.0000 mL | INTRAVENOUS | Status: DC | PRN
  Administered 2018-02-06: 10 mL via INTRAVENOUS
  Filled 2018-02-06: qty 10

## 2018-02-06 MED ORDER — MORPHINE SULFATE (PF) 2 MG/ML IV SOLN: 2.0000 mg | Freq: Once | INTRAVENOUS | Status: DC

## 2018-02-06 MED ORDER — MORPHINE SULFATE 2 MG/ML IJ SOLN
2.0000 mg | Freq: Once | INTRAMUSCULAR | Status: AC
  Administered 2018-02-06: 2 mg via INTRAVENOUS
  Filled 2018-02-06: qty 1

## 2018-02-06 MED ORDER — HEPARIN SOD (PORK) LOCK FLUSH 100 UNIT/ML IV SOLN
500.0000 [IU] | Freq: Once | INTRAVENOUS | Status: AC
  Administered 2018-02-06: 500 [IU] via INTRAVENOUS

## 2018-02-06 MED ORDER — ONDANSETRON HCL 4 MG/2ML IJ SOLN
8.0000 mg | Freq: Once | INTRAMUSCULAR | Status: AC
  Administered 2018-02-06: 8 mg via INTRAVENOUS

## 2018-02-06 MED ORDER — MORPHINE SULFATE (CONCENTRATE) 20 MG/ML PO SOLN: 10.0000 mg | ORAL | 0 refills | Status: DC | PRN

## 2018-02-06 MED ORDER — CLOTRIMAZOLE 10 MG MT TROC: 10.0000 mg | Freq: Every day | OROMUCOSAL | 0 refills | Status: DC

## 2018-02-06 NOTE — Progress Notes (Signed)
Symptom Management Consult note [facility]  Telephone:([phone] [phone]  Patient Care Team: Chrismon, Vickki Muff, PA as PCP - General (Physician Assistant)   Name of the patient: Denise Watts  [id number]  [birth year]   Date of visit: 02/06/2018  Diagnosis: Breast Cancer   Chief Complaint: Follow-up  Current Treatment: S/p cycle 1 of adjuvant TC Chemotherapy  Oncology History: Patient was seen in [facility] by me yesterday for mouth sores, dysphasia and weakness.  She was found to have acute lower back pain. UA was negative for infection. She was afebrile. Vital signs were stable.  Labs were okay.  She received 1 L NaCl, 8 mg Zofran and 2 mg morphine.  She was hypotensive.  She was started on Cipro 500 mg twice daily and instructed to return to clinic today for further evaluation.  She was instructed to go to the emergency room should she develop fever worsening lower back pain.  Oncology History   1. Carcinoma of breast, T1, N0, M0 tumor diagnosis.  In January of 1997 2. Carcinoma of the vocal cord T1, N0, M0 tumor.  Status postradiation therapy 3. Abnormal CT scan of the chest (December, 2015) 4. Repeat CT scan of chest shows stable nodule (July, 2016) 5. Neutropenia with normal hemoglobin and normal platelet count (July, 2016)     Cancer of vocal cord [facility])   11/10/2014 Initial Diagnosis    Cancer of vocal cord     Malignant neoplasm of lower-outer quadrant of right breast of female, estrogen receptor negative ([place])   11/24/2017 Initial Diagnosis    Malignant neoplasm of lower-outer quadrant of right breast of female, estrogen receptor negative ([place])    01/11/2018 -  Chemotherapy    The patient had palonosetron (ALOXI) injection 0.25 mg, 0.25 mg, Intravenous,  Once, 1 of 4 cycles Administration: 0.25 mg (01/30/2018) pegfilgrastim (NEULASTA ONPRO KIT) injection 6 mg, 6 mg, Subcutaneous, Once, 1 of 4 cycles Administration: 6 mg  (01/30/2018) cyclophosphamide (CYTOXAN) 1,000 mg in sodium chloride 0.9 % 250 mL chemo infusion, 600 mg/m2 = 1,000 mg, Intravenous,  Once, 1 of 4 cycles Administration: 1,000 mg (01/30/2018) DOCEtaxel (TAXOTERE) 130 mg in sodium chloride 0.9 % 250 mL chemo infusion, 75 mg/m2 = 130 mg, Intravenous,  Once, 1 of 4 cycles Administration: 130 mg (01/30/2018)  for chemotherapy treatment.     01/12/2018 Cancer Staging    Staging form: Breast, AJCC 8th Edition - Clinical stage from 01/12/2018: Stage IB (cT1a, cN0, cM0, G3, ER+, PR-, HER2-) - Signed by Sindy Guadeloupe, MD on 01/12/2018     Subjective Data:  Subjective:    Denise Watts is a 62 y.o. female who presents for follow up of low back problems. Current symptoms include: pain in lumbar spine and flank tenderness (aching and stabbing in character; 8/10 in severity) and weakness in bilateral lower extremities. Symptoms have not changed from the previous visit. Exacerbating factors identified by the patient are none. She also complains of dysphasia and continued mouth sores.  States Zofran ODT did help with nausea last night.  Still unable to drink fluids without nausea.  Denies vomiting.  Has been using Magic mouthwash as prescribed with mild improvement.   The following portions of the patient's history were reviewed and updated as appropriate: allergies, current medications, past family history, past medical history, past social history, past surgical history and problem list.    Objective:    BP 114/73 (BP Location: Left Arm, Patient Position: Sitting)  Pulse 71   Temp 99.6 F (37.6 C) (Tympanic)   Resp 20  General appearance: alert, fatigued, no distress and pale Back: CVA tenderness bilaterally Lungs: clear to auscultation bilaterally Abdomen: soft, non-tender; bowel sounds normal; no masses,  no organomegaly Extremities: extremities normal, atraumatic, no cyanosis or edema    Assessment:    Nonspecific acute low back pain   Dysphagia  d/t thrush; hx of throat cancer and recent chemo Oral lesions d/t recent chemotherapy   Plan:    Natural history and expected course discussed. Questions answered. Neurosurgeon distributed. Ice to affected area as needed for local pain relief. Tramadol 50 mg q 8 PRN    Breast cancer: Initial diagnosis of right breast cancer in 1997. S/p surgery and radiation. No chemotherapy or hormone therapy. New 4.5 mm nodule in right breast. found incidentally on CT chest and abdomen/pelvis for follow-up of pulmonary nodule.  Ultrasound and mammogram with core biopsy revealed grade 3 triple negative breast cancer.  Had lumpectomy by Dr. Bary Castilla on 12/08/2017.  She is  s/p cycle 1 TC chemotherapy.  Last given on 01/30/2018.  She is scheduled to return to clinic on 02/08/2018 for labs and possible IV fluids.   Carcinoma of the vocal cord: s/p radiation therapy and an extensive surgery and radiation therapy in 1998.   Lung nodules: History of smoking.  Under surveillance imaging CT scans of the chest yearly.  Most recent imaging from July 2018 showed stable left lower lobe pulmonary nodule.  Adrenal mass with peripheral hypermetabolism: Found on CT chest which prompted a PET scan in July 2018.  It was suspicious for isolated metastasis or primary adrenal neoplasm.  Biopsy revealed high-grade carcinoma.  Had adrenalectomy in August 2018.  Under active surveillance due to unclear etiology of the tumor.  Thrush d/t recent chemo: Started on Magic mouthwash yesterday with lidocaine.  Noted some improvement.  We will add Mycelex troches given she is unable to swallow pills at this time.  We will give 1 dose of IV Diflucan in clinic today.  Mucositis: Prescribed Magic mouthwash as mentioned above.  Will add liquid morphine 10 mg every 3 hours as needed for oral pain.   Lumbar back pain: Ruled out urinary tract infection.  Liquid morphine as mentioned above.  Patient instructed to try ice and heat with rotation  as needed.  If this continues, imaging may be recommended.  Greater than 50% was spent in counseling and coordination of care with this patient including but not limited to discussion of the relevant topics above (See A&P) including, but not limited to diagnosis and management of acute and chronic medical conditions.   Faythe Casa, NP 02/06/2018 3:12 PM

## 2018-02-07 ENCOUNTER — Other Ambulatory Visit: Payer: Self-pay | Admitting: *Deleted

## 2018-02-07 DIAGNOSIS — R1310 Dysphagia, unspecified: Secondary | ICD-10-CM

## 2018-02-07 DIAGNOSIS — C50511 Malignant neoplasm of lower-outer quadrant of right female breast: Secondary | ICD-10-CM

## 2018-02-07 DIAGNOSIS — Z171 Estrogen receptor negative status [ER-]: Secondary | ICD-10-CM

## 2018-02-07 DIAGNOSIS — K1230 Oral mucositis (ulcerative), unspecified: Secondary | ICD-10-CM

## 2018-02-08 ENCOUNTER — Encounter: Payer: Self-pay | Admitting: Oncology

## 2018-02-08 ENCOUNTER — Inpatient Hospital Stay: Payer: PRIVATE HEALTH INSURANCE

## 2018-02-08 ENCOUNTER — Inpatient Hospital Stay (HOSPITAL_BASED_OUTPATIENT_CLINIC_OR_DEPARTMENT_OTHER): Payer: PRIVATE HEALTH INSURANCE | Admitting: Oncology

## 2018-02-08 VITALS — BP 116/79 | HR 77 | Temp 98.2°F | Resp 18 | Wt 137.0 lb

## 2018-02-08 DIAGNOSIS — Z79899 Other long term (current) drug therapy: Secondary | ICD-10-CM

## 2018-02-08 DIAGNOSIS — Z8521 Personal history of malignant neoplasm of larynx: Secondary | ICD-10-CM

## 2018-02-08 DIAGNOSIS — R1310 Dysphagia, unspecified: Secondary | ICD-10-CM

## 2018-02-08 DIAGNOSIS — B370 Candidal stomatitis: Secondary | ICD-10-CM

## 2018-02-08 DIAGNOSIS — Z5111 Encounter for antineoplastic chemotherapy: Secondary | ICD-10-CM | POA: Diagnosis not present

## 2018-02-08 DIAGNOSIS — Z923 Personal history of irradiation: Secondary | ICD-10-CM

## 2018-02-08 DIAGNOSIS — R4702 Dysphasia: Secondary | ICD-10-CM

## 2018-02-08 DIAGNOSIS — Z171 Estrogen receptor negative status [ER-]: Secondary | ICD-10-CM

## 2018-02-08 DIAGNOSIS — R5381 Other malaise: Secondary | ICD-10-CM

## 2018-02-08 DIAGNOSIS — K1230 Oral mucositis (ulcerative), unspecified: Secondary | ICD-10-CM

## 2018-02-08 DIAGNOSIS — C50511 Malignant neoplasm of lower-outer quadrant of right female breast: Secondary | ICD-10-CM | POA: Diagnosis not present

## 2018-02-08 DIAGNOSIS — Z87891 Personal history of nicotine dependence: Secondary | ICD-10-CM

## 2018-02-08 DIAGNOSIS — R5383 Other fatigue: Secondary | ICD-10-CM

## 2018-02-08 DIAGNOSIS — M545 Low back pain: Secondary | ICD-10-CM

## 2018-02-08 DIAGNOSIS — Z853 Personal history of malignant neoplasm of breast: Secondary | ICD-10-CM

## 2018-02-08 LAB — BASIC METABOLIC PANEL
ANION GAP: 8 (ref 5–15)
BUN: 19 mg/dL (ref 8–23)
CHLORIDE: 110 mmol/L (ref 98–111)
CO2: 23 mmol/L (ref 22–32)
CREATININE: 0.78 mg/dL (ref 0.44–1.00)
Calcium: 7.4 mg/dL — ABNORMAL LOW (ref 8.9–10.3)
GFR calc non Af Amer: >60 mL/min (ref >60)
Glucose, Bld: 135 mg/dL — ABNORMAL HIGH (ref 70–99)
Potassium: 3.5 mmol/L (ref 3.5–5.1)
Sodium: 141 mmol/L (ref 135–145)

## 2018-02-08 LAB — URINE CULTURE

## 2018-02-08 MED ORDER — HEPARIN SOD (PORK) LOCK FLUSH 100 UNIT/ML IV SOLN
500.0000 [IU] | Freq: Once | INTRAVENOUS | Status: AC
  Administered 2018-02-08: 500 [IU] via INTRAVENOUS

## 2018-02-08 MED ORDER — SODIUM CHLORIDE 0.9% FLUSH
10.0000 mL | INTRAVENOUS | Status: DC | PRN
  Administered 2018-02-08: 10 mL via INTRAVENOUS
  Filled 2018-02-08: qty 10

## 2018-02-08 NOTE — Progress Notes (Signed)
Pt in for follow up, reports "some improvement in mouth and throat thrush since starting new medication regime".

## 2018-02-09 NOTE — Progress Notes (Signed)
Hematology/Oncology Consult note [facility]  [phone] [phone]  Patient Care Team: Chrismon, Vickki Muff, PA as PCP - General (Physician Assistant)   Name of the patient: Denise Watts  [id number]  Nov 03, 1955   Date of visit: 02/09/18  Diagnosis- 1.. H/o breast cancer in 1997 2. H/o stage I SCC of vocal cord in 2015. 3. Lung nodules4. Adrenal carcinoma of unknown primary s/p resection5. Stage IaT1a NxcM0 ER weakly positive 1-10%, PR negative and her 2 negative   Chief complaint/ Reason for visit- toxicity check after cycle 1 of TC chemotherapy  Heme/Onc history:  Oncology History   1. Carcinoma of breast, T1, N0, M0 tumor diagnosis.  In January of 1997 2. Carcinoma of the vocal cord T1, N0, M0 tumor.  Status postradiation therapy 3. Abnormal CT scan of the chest (December, 2015) 4. Repeat CT scan of chest shows stable nodule (July, 2016) 5. Neutropenia with normal hemoglobin and normal platelet count (July, 2016)     Cancer of vocal cord [employer])   11/10/2014 Initial Diagnosis    Cancer of vocal cord     Malignant neoplasm of lower-outer quadrant of right breast of female, estrogen receptor negative ([place])   11/24/2017 Initial Diagnosis    Malignant neoplasm of lower-outer quadrant of right breast of female, estrogen receptor negative ([place])    01/11/2018 -  Chemotherapy    The patient had palonosetron (ALOXI) injection 0.25 mg, 0.25 mg, Intravenous,  Once, 1 of 4 cycles Administration: 0.25 mg (01/30/2018) pegfilgrastim (NEULASTA ONPRO KIT) injection 6 mg, 6 mg, Subcutaneous, Once, 1 of 4 cycles Administration: 6 mg (01/30/2018) cyclophosphamide (CYTOXAN) 1,000 mg in sodium chloride 0.9 % 250 mL chemo infusion, 600 mg/m2 = 1,000 mg, Intravenous,  Once, 1 of 4 cycles Administration: 1,000 mg (01/30/2018) DOCEtaxel (TAXOTERE) 130 mg in sodium chloride 0.9 % 250 mL chemo infusion, 75 mg/m2 = 130 mg, Intravenous,  Once, 1 of 4  cycles Administration: 130 mg (01/30/2018)  for chemotherapy treatment.     01/12/2018 Cancer Staging    Staging form: Breast, AJCC 8th Edition - Clinical stage from 01/12/2018: Stage IB (cT1a, cN0, cM0, G3, ER+, PR-, HER2-) - Signed by Sindy Guadeloupe, MD on 01/12/2018    The patient was originally diagnosed with right breast cancer in 1997,10 mm grade 3, ER pr positive her 2 negative, node negativestatus post surgery and radiation treatment only. She never received chemotherapy or hormone therapy. For her vocal cord cancer, she had extensive surgery and radiation therapy in 1998. Due to past history of smoking, she was also noted to have lung nodules. She is undergoing active surveillance imaging study of the chest.  Patient noted to have adrenal mass on CT chest which prompted PET CT in July 2018 which showed:IMPRESSION: 1. Right adrenal mass with peripheral hypermetabolism. Given interval development since 11/18/2015, suspicious for either isolated metastasis or a primary adrenal neoplasm. This should be considered for biopsy. 2. No evidence of hypermetabolic primary malignancy or extraadrenal metastasis. Pulmonary nodules are not significantly hypermetabolic. 3. Coronary artery atherosclerosis. Aortic Atherosclerosis (ICD10-I70.0). 4. Hepatic steatosis. 5. Left sixth rib hypermetabolism is favored to be related to remote Trauma.  Patient underwent right adrenalectomy which showed:DIAGNOSIS:  A. ADRENAL GLAND, RIGHT; ADRENALECTOMY:  - HIGH GRADE CARCINOMA WITH EXTENSIVE NECROSIS.  - MARGINS OF THE SPECIMEN ARE NEGATIVE FOR CARCINOMA.   Comment:  Sections demonstrate abundant necrosis confined to the adrenal medullary  compartment. In one block of tissue a small fragment of  viable malignant  neoplasm was identified. A panel of immunohistochemical stains was  performed with the following pattern of immunoreactivity:  Super pancytokeratin: Positive  GATA-3: Negative  TTF-1:  Negative  P40: Negative  Napsin: Negative  PAX-8: Negative  Chromogranin: Negative   Repeat CT chest abdomen and pelvis in July 2019 showed stable left lower lobe pulmonary nodule. No other evidence of metastatic disease. New 4.5 mm nodule in the right breast. This was followed by ultrasound mammogram and core biopsy of the breast lesion which was a grade 3 triple negative breast cancer. Patient was seen by Dr. Bary Castilla and underwent lumpectomy on 12/08/2017.  Patient had a 5 mm weakly ER positive tumor. Given her ongoing fatigue Oncotype testing was done to see if she could potentially avoid chemotherapy. Oncotype shows a recurrence score of 53 with her risk of distant recurrence at 9 years at greater than 39% with hormone therapy alone. Absolute benefit of chemotherapy was greater than 15% in her age group.   Interval history-patient had significant thrush as well as difficulty swallowing and pain after 1 cycle of chemotherapy.  She continues to have ongoing fatigue.  Patient was seen few days ago and symptom management when she also reported some lower back pain.  ECOG PS- 1 Pain scale- 4 Opioid associated constipation- no  Review of systems- Review of Systems  Constitutional: Positive for malaise/fatigue. Negative for chills, fever and weight loss.  HENT: Negative for congestion, ear discharge and nosebleeds.   Eyes: Negative for blurred vision.  Respiratory: Negative for cough, hemoptysis, sputum production, shortness of breath and wheezing.   Cardiovascular: Negative for chest pain, palpitations, orthopnea and claudication.  Gastrointestinal: Negative for abdominal pain, blood in stool, constipation, diarrhea, heartburn, melena, nausea and vomiting.       Pain and difficulty swallowing  Genitourinary: Negative for dysuria, flank pain, frequency, hematuria and urgency.  Musculoskeletal: Negative for back pain, joint pain and myalgias.  Skin: Negative for rash.  Neurological:  Negative for dizziness, tingling, focal weakness, seizures, weakness and headaches.  Endo/Heme/Allergies: Does not bruise/bleed easily.  Psychiatric/Behavioral: Negative for depression and suicidal ideas. The patient does not have insomnia.       No Known Allergies   Past Medical History:  Diagnosis Date  . Arthritis    KNEE RIGHT  . Breast cancer [facility]) 1997   right breast lumpectomy with radiation  . Breast cancer ([place]) 11/15/2017   INVASIVE MAMMARY CARCINOMA/ triple negative  . 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[place]) 11/10/2014  . Cough 03/02/2017   INTERMTTENT DRY COUGH  . GERD (gastroesophageal reflux disease)   . History of hiatal hernia   . Personal history of radiation therapy   . Skin cancer   . Throat cancer [facility]) 1998   radiation     Past Surgical History:  Procedure Laterality Date  . BREAST BIOPSY Right 1997   positive  . BREAST BIOPSY Right 11/15/2017   INVASIVE MAMMARY CARCINOMA triple negative  . BREAST LUMPECTOMY Right 1997  . BREAST LUMPECTOMY WITH SENTINEL LYMPH NODE BIOPSY Right 12/08/2017   Procedure: BREAST LUMPECTOMY WITH SENTINEL LYMPH NODE BX;  Surgeon: Robert Bellow, MD;  Location: [facility] ORS;  Service: General;  Laterality: Right;  . COLONOSCOPY  2015  . ESOPHAGUS SURGERY    . KNEE SURGERY Right   . PORTACATH PLACEMENT Right 01/17/2018   Procedure: INSERTION PORT-A-CATH- RIGHT;  Surgeon: Robert Bellow, MD;  Location: [facility] ORS;  Service: General;  Laterality: Right;  . ROBOTIC ADRENALECTOMY Right 12/28/2016  Procedure: ROBOTIC ADRENALECTOMY;  Surgeon: Hollice Espy, MD;  Location: [facility] ORS;  Service: Urology;  Laterality: Right;  . SKIN CANCER EXCISION    . THROAT SURGERY  1998   throat cancer   . TUBAL LIGATION    . VENTRAL HERNIA REPAIR N/A 03/03/2017   Procedure: HERNIA REPAIR VENTRAL ADULT;  Surgeon: Clayburn Pert, MD;  Location: [facility] ORS;  Service: General;  Laterality: N/A;    Social History   Socioeconomic History  . Marital status:  Married    Spouse name: Not on file  . Number of children: Not on file  . Years of education: Not on file  . Highest education level: Not on file  Occupational History  . Not on file  Social Needs  . Financial resource strain: Not on file  . Food insecurity:    Worry: Not on file    Inability: Not on file  . Transportation needs:    Medical: Not on file    Non-medical: Not on file  Tobacco Use  . Smoking status: Former Smoker    Packs/day: 1.50    Years: 30.00    Pack years: 45.00    Types: Cigarettes    Last attempt to quit: 05/02/1996    Years since quitting: 21.7  . Smokeless tobacco: Never Used  Substance and Sexual Activity  . Alcohol use: Yes    Alcohol/week: 0.0 standard drinks    Comment: OCC  . Drug use: No  . Sexual activity: Not on file  Lifestyle  . Physical activity:    Days per week: Not on file    Minutes per session: Not on file  . Stress: Not on file  Relationships  . Social connections:    Talks on phone: Not on file    Gets together: Not on file    Attends religious service: Not on file    Active member of club or organization: Not on file    Attends meetings of clubs or organizations: Not on file    Relationship status: Not on file  . Intimate partner violence:    Fear of current or ex partner: Not on file    Emotionally abused: Not on file    Physically abused: Not on file    Forced sexual activity: Not on file  Other Topics Concern  . Not on file  Social History Narrative  . Not on file    Family History  Problem Relation Age of Onset  . Diabetes Mother   . Heart attack Mother   . Cancer Sister        cancer of eyes, spread to lung, other places. died at 52  . Cancer Sister 46       unknown primary, spread to bone, brain died in 41's  . Cancer Paternal Aunt        unk type  . Cancer Paternal Grandmother        type unk  . Prostate cancer Neg Hx   . Kidney cancer Neg Hx   . Bladder Cancer Neg Hx      Current Outpatient  Medications:  .  clotrimazole (MYCELEX) 10 MG troche, Take 1 tablet (10 mg total) by mouth 5 (five) times daily., Disp: 50 tablet, Rfl: 0 .  Cyanocobalamin (B-12 COMPLIANCE INJECTION IJ), Inject 1 Dose as directed every 30 (thirty) days., Disp: , Rfl:  .  LORazepam (ATIVAN) 0.5 MG tablet, Take 1 tablet (0.5 mg total) by mouth every 6 (six) hours as needed (Nausea  or vomiting)., Disp: 30 tablet, Rfl: 0 .  magic mouthwash w/lidocaine SOLN, Take 5 mLs by mouth 4 (four) times daily., Disp: 180 mL, Rfl: 0 .  morphine (ROXANOL) 20 MG/ML concentrated solution, Take 0.5 mLs (10 mg total) by mouth every 3 (three) hours as needed for severe pain., Disp: 240 mL, Rfl: 0 .  ondansetron (ZOFRAN ODT) 8 MG disintegrating tablet, Take 1 tablet (8 mg total) by mouth every 8 (eight) hours as needed for nausea or vomiting., Disp: 20 tablet, Rfl: 0 .  ciprofloxacin (CIPRO) 500 MG tablet, Take 1 tablet (500 mg total) by mouth 2 (two) times daily. (Patient not taking: Reported on 02/06/2018), Disp: 20 tablet, Rfl: 0 .  dexamethasone (DECADRON) 4 MG tablet, Take 2 tablets (8 mg total) by mouth 2 (two) times daily. Start the day before Taxotere. Then again the day after chemo for 3 days. (Patient not taking: Reported on 01/17/2018), Disp: 30 tablet, Rfl: 1 .  gabapentin (NEURONTIN) 300 MG capsule, Take 1 capsule (300 mg total) by mouth 3 (three) times daily. (Patient not taking: Reported on 02/06/2018), Disp: 90 capsule, Rfl: 0 .  HYDROcodone-acetaminophen (NORCO/VICODIN) 5-325 MG tablet, Take 1 tablet by mouth every 4 (four) hours as needed for moderate pain. (Patient not taking: Reported on 02/06/2018), Disp: 20 tablet, Rfl: 0 .  lidocaine-prilocaine (EMLA) cream, Apply 1 application topically as needed. (Patient not taking: Reported on 02/08/2018), Disp: 30 g, Rfl: 0 .  naproxen sodium (ALEVE) 220 MG tablet, Take 220 mg by mouth daily as needed (pain). , Disp: , Rfl:  .  omeprazole (PRILOSEC) 40 MG capsule, TAKE 1 CAPSULE BY  MOUTH EVERY DAY-AM (Patient not taking: Reported on 02/08/2018), Disp: 30 capsule, Rfl: 11 .  ondansetron (ZOFRAN) 8 MG tablet, Take 1 tablet (8 mg total) by mouth 2 (two) times daily as needed for refractory nausea / vomiting. Start on day 3 after chemo. (Patient not taking: Reported on 01/17/2018), Disp: 30 tablet, Rfl: 1 .  prochlorperazine (COMPAZINE) 10 MG tablet, Take 1 tablet (10 mg total) by mouth every 6 (six) hours as needed (Nausea or vomiting). (Patient not taking: Reported on 01/17/2018), Disp: 30 tablet, Rfl: 1 .  traMADol (ULTRAM) 50 MG tablet, Take 1 tablet (50 mg total) by mouth every 6 (six) hours as needed. (Patient not taking: Reported on 02/08/2018), Disp: 30 tablet, Rfl: 0  Physical exam:  Vitals:   02/08/18 1009  BP: 116/79  Pulse: 77  Resp: 18  Temp: 98.2 F (36.8 C)  TempSrc: Tympanic  Weight: 137 lb (62.1 kg)   Physical Exam  Constitutional: She is oriented to person, place, and time. She appears well-developed and well-nourished.  She appears fatigued  HENT:  Head: Normocephalic and atraumatic.  Mouth/Throat: Oropharynx is clear and moist.  Angular cheilitis  Eyes: Pupils are equal, round, and reactive to light. EOM are normal.  Neck: Normal range of motion.  Cardiovascular: Normal rate, regular rhythm and normal heart sounds.  Pulmonary/Chest: Effort normal and breath sounds normal.  Abdominal: Soft. Bowel sounds are normal.  Neurological: She is alert and oriented to person, place, and time.  Skin: Skin is warm and dry.     CMP Latest Ref Rng & Units 02/08/2018  Glucose 70 - 99 mg/dL 135(H)  BUN 8 - 23 mg/dL 19  Creatinine 0.44 - 1.00 mg/dL 0.78  Sodium 135 - 145 mmol/L 141  Potassium 3.5 - 5.1 mmol/L 3.5  Chloride 98 - 111 mmol/L 110  CO2 22 - 32 mmol/L  23  Calcium 8.9 - 10.3 mg/dL 7.4(L)  Total Protein 6.5 - 8.1 g/dL -  Total Bilirubin 0.3 - 1.2 mg/dL -  Alkaline Phos 38 - 126 U/L -  AST 15 - 41 U/L -  ALT 0 - 44 U/L -   CBC Latest Ref Rng &  Units 02/05/2018  WBC 3.6 - 11.0 K/uL 3.5(L)  Hemoglobin 12.0 - 16.0 g/dL 14.6  Hematocrit 35.0 - 47.0 % 42.0  Platelets 150 - 440 K/uL 116(L)    No images are attached to the encounter.  Dg Chest Port 1 View  Result Date: 01/17/2018 CLINICAL DATA:  62 year old female status post Port-A-Cath placement. EXAM: PORTABLE CHEST 1 VIEW COMPARISON:  Chest CT 11/06/2017 and earlier. FINDINGS: Portable AP upright view at 1307 hours. A right chest IJ approach power port type porta cath is in place. The superior most extent of the catheter tubing is not included on this portable image. The catheter tip projects at the cavoatrial junction level. No pneumothorax. Mediastinal contours remain normal. Lower lung volumes. No pulmonary edema, pleural effusion or confluent pulmonary opacity. Calcified aortic atherosclerosis. Negative visible bowel gas pattern. No acute osseous abnormality identified. IMPRESSION: 1. Right IJ approach power port placed with no adverse features identified, but note that the superior most extent of the catheter tubing was not included. 2.  No acute cardiopulmonary abnormality. Electronically Signed   By: Genevie Ann M.D.   On: 01/17/2018 13:41   Dg C-arm 1-60 Min-no Report  Result Date: 01/17/2018 Fluoroscopy was utilized by the requesting physician.  No radiographic interpretation.     Assessment and plan- Patient is a 62 y.o. 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 Final path showed 5 mm grade 3 Er weakly positive 1-10%, pr negative and her 2 negative tumor.Oncotype came back as high risk and she is status post 1 cycle of TC chemotherapy here for toxicity check after 1 cycle  3 days ago patient was seen in symptom management  clinic for worsening thrush.  She received 1 dose of IV Diflucan and was also given Mycelex troches.  Her thrush has now completely resolved.  She continues to have some difficulty and pain during swallowing which is overall better and she is using PRN morphine for the same.  Patient also had low back pain 3 days ago which has now resolved.  Her urinalysis and urine culture were negative.  She continues to have ongoing fatigue which I chronic even before we started chemotherapy. Etiology unclear. She is taking [place] ginseng. She also has low b12 leevls and is getting monthly b12 shots  I will see her back in 10 days time with CBC and CMP for cycle 2 of TC adjuvant chemotherapy.  Depending on how she feels I may consider delaying the chemotherapy by 1 more week.  Her blood pressure is better today and she is not tachycardic.  I will hold off on giving IV fluids today   Visit Diagnosis 1. Malignant neoplasm of lower-outer quadrant of right breast of female, estrogen receptor negative ([place])   2. Oral thrush      Dr. Randa Evens, MD, MPH [facility] at [facility] [phone] 02/09/2018 12:45 PM

## 2018-02-13 ENCOUNTER — Other Ambulatory Visit: Payer: Self-pay

## 2018-02-13 ENCOUNTER — Encounter: Payer: Self-pay | Admitting: General Surgery

## 2018-02-13 ENCOUNTER — Ambulatory Visit: Payer: No Typology Code available for payment source | Admitting: General Surgery

## 2018-02-13 ENCOUNTER — Ambulatory Visit (INDEPENDENT_AMBULATORY_CARE_PROVIDER_SITE_OTHER): Payer: No Typology Code available for payment source

## 2018-02-13 ENCOUNTER — Telehealth: Payer: Self-pay | Admitting: *Deleted

## 2018-02-13 VITALS — BP 131/85 | HR 89 | Temp 97.5°F | Resp 13 | Ht 61.0 in | Wt 137.0 lb

## 2018-02-13 DIAGNOSIS — Z171 Estrogen receptor negative status [ER-]: Secondary | ICD-10-CM

## 2018-02-13 DIAGNOSIS — C50511 Malignant neoplasm of lower-outer quadrant of right female breast: Secondary | ICD-10-CM

## 2018-02-13 MED ORDER — HYDROCORTISONE 0.5 % EX CREA: 1.0000 "application " | TOPICAL_CREAM | Freq: Two times a day (BID) | CUTANEOUS | 0 refills | Status: DC

## 2018-02-13 NOTE — Patient Instructions (Signed)
Patient is to return in 3 months no mammogram is needed.

## 2018-02-13 NOTE — Progress Notes (Signed)
Patient ID: Denise Watts, female   DOB: 08/05/1955, 62 y.o.   MRN: [id number]  Chief Complaint  Patient presents with  . Follow-up    1 month post port placement Sx 01/17/18, /post op R breast lumpectomy sx 12/08/17    HPI Denise Watts is a 62 y.o. female here today to follow up for 1 month post port placement Sx 01/17/18, and post op right breast lumpectomy sx 12/08/17. Patient states she is feeling well and has no concerns with the port.  HPI  Past Medical History:  Diagnosis Date  . Arthritis    KNEE RIGHT  . Breast cancer [facility]) 1997   right breast lumpectomy with radiation  . Breast cancer ([place]) 11/15/2017   INVASIVE MAMMARY CARCINOMA/ triple negative  . 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[place]) 11/10/2014  . Cough 03/02/2017   INTERMTTENT DRY COUGH  . GERD (gastroesophageal reflux disease)   . History of hiatal hernia   . Personal history of radiation therapy   . Skin cancer   . Throat cancer [facility]) 1998   radiation    Past Surgical History:  Procedure Laterality Date  . BREAST BIOPSY Right 1997   positive  . BREAST BIOPSY Right 11/15/2017   INVASIVE MAMMARY CARCINOMA triple negative  . BREAST LUMPECTOMY Right 1997  . BREAST LUMPECTOMY WITH SENTINEL LYMPH NODE BIOPSY Right 12/08/2017   Procedure: BREAST LUMPECTOMY WITH SENTINEL LYMPH NODE BX;  Surgeon: Robert Bellow, MD;  Location: [facility] ORS;  Service: General;  Laterality: Right;  . COLONOSCOPY  2015  . ESOPHAGUS SURGERY    . KNEE SURGERY Right   . PORTACATH PLACEMENT Right 01/17/2018   Procedure: INSERTION PORT-A-CATH- RIGHT;  Surgeon: Robert Bellow, MD;  Location: [facility] ORS;  Service: General;  Laterality: Right;  . ROBOTIC ADRENALECTOMY Right 12/28/2016   Procedure: ROBOTIC ADRENALECTOMY;  Surgeon: Hollice Espy, MD;  Location: [facility] ORS;  Service: Urology;  Laterality: Right;  . SKIN CANCER EXCISION    . THROAT SURGERY  1998   throat cancer   . TUBAL LIGATION    . VENTRAL HERNIA REPAIR N/A 03/03/2017   Procedure: HERNIA  REPAIR VENTRAL ADULT;  Surgeon: Clayburn Pert, MD;  Location: [facility] ORS;  Service: General;  Laterality: N/A;    Family History  Problem Relation Age of Onset  . Diabetes Mother   . Heart attack Mother   . Cancer Sister        cancer of eyes, spread to lung, other places. died at 22  . Cancer Sister 63       unknown primary, spread to bone, brain died in 75's  . Cancer Paternal Aunt        unk type  . Cancer Paternal Grandmother        type unk  . Prostate cancer Neg Hx   . Kidney cancer Neg Hx   . Bladder Cancer Neg Hx     Social History Social History   Tobacco Use  . Smoking status: Former Smoker    Packs/day: 1.50    Years: 30.00    Pack years: 45.00    Types: Cigarettes    Last attempt to quit: 05/02/1996    Years since quitting: 21.8  . Smokeless tobacco: Never Used  Substance Use Topics  . Alcohol use: Yes    Alcohol/week: 0.0 standard drinks    Comment: OCC  . Drug use: No    No Known Allergies  Current Outpatient Medications  Medication Sig Dispense Refill  .  ciprofloxacin (CIPRO) 500 MG tablet Take 1 tablet (500 mg total) by mouth 2 (two) times daily. 20 tablet 0  . clotrimazole (MYCELEX) 10 MG troche Take 1 tablet (10 mg total) by mouth 5 (five) times daily. 50 tablet 0  . Cyanocobalamin (B-12 COMPLIANCE INJECTION IJ) Inject 1 Dose as directed every 30 (thirty) days.    Marland Kitchen gabapentin (NEURONTIN) 300 MG capsule Take 1 capsule (300 mg total) by mouth 3 (three) times daily. 90 capsule 0  . HYDROcodone-acetaminophen (NORCO/VICODIN) 5-325 MG tablet Take 1 tablet by mouth every 4 (four) hours as needed for moderate pain. 20 tablet 0  . magic mouthwash w/lidocaine SOLN Take 5 mLs by mouth 4 (four) times daily. 180 mL 0  . morphine (ROXANOL) 20 MG/ML concentrated solution Take 0.5 mLs (10 mg total) by mouth every 3 (three) hours as needed for severe pain. 240 mL 0  . naproxen sodium (ALEVE) 220 MG tablet Take 220 mg by mouth daily as needed (pain).     Marland Kitchen  omeprazole (PRILOSEC) 40 MG capsule TAKE 1 CAPSULE BY MOUTH EVERY DAY-AM 30 capsule 11  . ondansetron (ZOFRAN ODT) 8 MG disintegrating tablet Take 1 tablet (8 mg total) by mouth every 8 (eight) hours as needed for nausea or vomiting. 20 tablet 0  . traMADol (ULTRAM) 50 MG tablet Take 1 tablet (50 mg total) by mouth every 6 (six) hours as needed. 30 tablet 0  . dexamethasone (DECADRON) 4 MG tablet Take 2 tablets (8 mg total) by mouth 2 (two) times daily. Start the day before Taxotere. Then again the day after chemo for 3 days. (Patient not taking: Reported on 02/13/2018) 30 tablet 1  . hydrocortisone cream 0.5 % Apply 1 application topically 2 (two) times daily. 30 g 0  . lidocaine-prilocaine (EMLA) cream Apply 1 application topically as needed. (Patient not taking: Reported on 02/13/2018) 30 g 0  . LORazepam (ATIVAN) 0.5 MG tablet Take 1 tablet (0.5 mg total) by mouth every 6 (six) hours as needed (Nausea or vomiting). (Patient not taking: Reported on 02/13/2018) 30 tablet 0  . ondansetron (ZOFRAN) 8 MG tablet Take 1 tablet (8 mg total) by mouth 2 (two) times daily as needed for refractory nausea / vomiting. Start on day 3 after chemo. (Patient not taking: Reported on 02/13/2018) 30 tablet 1  . prochlorperazine (COMPAZINE) 10 MG tablet Take 1 tablet (10 mg total) by mouth every 6 (six) hours as needed (Nausea or vomiting). (Patient not taking: Reported on 02/13/2018) 30 tablet 1   No current facility-administered medications for this visit.     Review of Systems Review of Systems  Constitutional: Negative.   Respiratory: Negative.   Cardiovascular: Negative.     Blood pressure 131/85, pulse 89, temperature (!) 97.5 F (36.4 C), temperature source Temporal, resp. rate 13, height 5\' 1"  (1.549 m), weight 137 lb (62.1 kg), SpO2 97 %.  Physical Exam Physical Exam  Constitutional: She is oriented to person, place, and time. She appears well-developed and well-nourished.  Eyes: No scleral icterus.   Pulmonary/Chest: Right breast exhibits no inverted nipple, no mass, no nipple discharge, no skin change and no tenderness. Left breast exhibits no inverted nipple, no mass, no nipple discharge, no skin change and no tenderness. Breasts are symmetrical.    Neurological: She is alert and oriented to person, place, and time.  Skin: Skin is warm and dry.    Data Reviewed Ultrasound examination of the surgical site was completed due to the patient's persistent discomfort.  No evidence of loculated fluid or seroma.  A small cyst is noted beneath the incision with a maximum diameter of 0.86 cm.  No deep seroma cavity identified.  Assessment    Doing well post re-excisional therapy for recurrent breast cancer, external beam accelerated radiation therapy.    Plan Patient is to return in 3 months no mammogram is needed.  HPI, Physical Exam, Assessment and Plan have been scribed under the direction and in the presence of Hervey Ard, Md.  Eudelia Bunch R. Bobette Mo, CMA     .I have completed the exam and reviewed the above documentation for accuracy and completeness.  I agree with the above.  Haematologist has been used and any errors in dictation or transcription are unintentional.  Hervey Ard, M.D., F.A.C.S.    Denise Watts Denise Watts 02/13/2018, 9:19 PM

## 2018-02-13 NOTE — Telephone Encounter (Signed)
I am seeing her next week. Can you please send her a prescription for topical steroid cream

## 2018-02-13 NOTE — Telephone Encounter (Signed)
Prescription e scribed and patient notified

## 2018-02-13 NOTE — Telephone Encounter (Signed)
Patient called and reports that she has a rash on her arm that feels like she got burned, but she didn't. Please advise

## 2018-02-16 ENCOUNTER — Telehealth: Payer: Self-pay | Admitting: *Deleted

## 2018-02-16 MED ORDER — PREDNISONE 10 MG (21) PO TBPK: ORAL_TABLET | ORAL | 0 refills | Status: DC

## 2018-02-16 NOTE — Telephone Encounter (Signed)
Patient states rash is not getting better, it all over the front of her and is burning. THe cream she was given is not helping.Please advise, patient is asking to see Dr Janese Banks

## 2018-02-19 ENCOUNTER — Inpatient Hospital Stay: Payer: PRIVATE HEALTH INSURANCE

## 2018-02-19 ENCOUNTER — Telehealth: Payer: Self-pay

## 2018-02-19 DIAGNOSIS — Z5111 Encounter for antineoplastic chemotherapy: Secondary | ICD-10-CM | POA: Diagnosis not present

## 2018-02-19 DIAGNOSIS — E538 Deficiency of other specified B group vitamins: Secondary | ICD-10-CM

## 2018-02-19 MED ORDER — CYANOCOBALAMIN 1000 MCG/ML IJ SOLN
1000.0000 ug | INTRAMUSCULAR | Status: DC
  Administered 2018-02-19: 1000 ug via INTRAMUSCULAR

## 2018-02-19 NOTE — Telephone Encounter (Signed)
Spoke with Denise Watts she states per Dr Janese Banks she can skip treatment 02/20/18. Per Dr Janese Banks she can skip treatment for 02/20/18 and get her reschedule for 1 week from today with possible treatment. The patient was understanding and agreeable with the plan. I have sent an In basket to [place] scheduler to get the patient schedule 1 week out from today.

## 2018-02-20 ENCOUNTER — Inpatient Hospital Stay: Payer: PRIVATE HEALTH INSURANCE | Admitting: Oncology

## 2018-02-20 ENCOUNTER — Inpatient Hospital Stay: Payer: PRIVATE HEALTH INSURANCE

## 2018-02-21 ENCOUNTER — Other Ambulatory Visit: Payer: Self-pay

## 2018-02-21 ENCOUNTER — Ambulatory Visit
Admission: RE | Admit: 2018-02-21 | Discharge: 2018-02-21 | Disposition: A | Discharge status: Home / Self Care | Payer: Self-pay | Source: Ambulatory Visit | Attending: Radiation Oncology | Admitting: Radiation Oncology

## 2018-02-21 ENCOUNTER — Encounter: Payer: Self-pay | Admitting: Radiation Oncology

## 2018-02-21 DIAGNOSIS — Z170 Estrogen receptor positive status [ER+]: Secondary | ICD-10-CM

## 2018-02-21 DIAGNOSIS — Z923 Personal history of irradiation: Secondary | ICD-10-CM | POA: Insufficient documentation

## 2018-02-21 DIAGNOSIS — Z853 Personal history of malignant neoplasm of breast: Secondary | ICD-10-CM | POA: Insufficient documentation

## 2018-02-21 DIAGNOSIS — C50411 Malignant neoplasm of upper-outer quadrant of right female breast: Secondary | ICD-10-CM

## 2018-02-21 DIAGNOSIS — Z08 Encounter for follow-up examination after completed treatment for malignant neoplasm: Secondary | ICD-10-CM | POA: Insufficient documentation

## 2018-02-21 NOTE — Progress Notes (Signed)
Radiation Oncology Follow up Note  Name: Denise Watts   Date:   02/21/2018 MRN:  [id number] DOB: 24-Oct-1955    This 62 y.o. female presents to the clinic today for one-month follow-up status post recurrent invasive mammary carcinoma the right breast previously treated with wide local excision radiation in 1997 underwent external beam partial breast radiation.  REFERRING PROVIDER: Margo Common, PA  HPI: patient is a 62 year old female now out 1 month having completed external beam partial breast radiation image guided technique for recurrent invasive mammary carcinoma the right breast status post wide local excision and radiation therapy to her whole breast back in 1997. She seen today in routine follow-up is doing fairly well..she's currently being treated with Taxotere as well as well as Cytoxan.her tumor was triple negative. She is tolerating her chemotherapy fairly well. She specifically denies breast tenderness cough or bone pain.  COMPLICATIONS OF TREATMENT: none  FOLLOW UP COMPLIANCE: keeps appointments   PHYSICAL EXAM:  BP (P) 130/77 (BP Location: Left Arm, Patient Position: Sitting)   Pulse (P) 75   Temp (P) 98.3 F (36.8 C) (Oral)   Wt (P) 137 lb 3.8 oz (62.2 kg)   BMI (P) 25.93 kg/m  Right breast shows thickening of the lumpectomy site cosmetic result is still good to excellent. No other dominant mass or nodularity is noted in either breast in 2 positions examined. No axillary or supraclavicular adenopathy is identified.Well-developed well-nourished patient in NAD. HEENT reveals PERLA, EOMI, discs not visualized.  Oral cavity is clear. No oral mucosal lesions are identified. Neck is clear without evidence of cervical or supraclavicular adenopathy. Lungs are clear to A&P. Cardiac examination is essentially unremarkable with regular rate and rhythm without murmur rub or thrill. Abdomen is benign with no organomegaly or masses noted. Motor sensory and DTR levels are equal and  symmetric in the upper and lower extremities. Cranial nerves II through XII are grossly intact. Proprioception is intact. No peripheral adenopathy or edema is identified. No motor or sensory levels are noted. Crude visual fields are within normal range.  RADIOLOGY RESULTS: no current films for review  PLAN: present time patient is doing well after accelerated partial breast radiation using external beam treatment. She continues on chemotherapy under medical oncology's direction. I have asked to see her back in 4-5 months for follow-up. Patient is to call with any concerns.  I would like to take this opportunity to thank you for allowing me to participate in the care of your patient.Noreene Filbert, MD

## 2018-02-26 ENCOUNTER — Other Ambulatory Visit: Payer: Self-pay

## 2018-02-26 DIAGNOSIS — C320 Malignant neoplasm of glottis: Secondary | ICD-10-CM

## 2018-02-27 ENCOUNTER — Inpatient Hospital Stay: Payer: PRIVATE HEALTH INSURANCE

## 2018-02-27 ENCOUNTER — Inpatient Hospital Stay (HOSPITAL_BASED_OUTPATIENT_CLINIC_OR_DEPARTMENT_OTHER): Payer: PRIVATE HEALTH INSURANCE | Admitting: Oncology

## 2018-02-27 ENCOUNTER — Encounter: Payer: Self-pay | Admitting: Oncology

## 2018-02-27 VITALS — BP 138/86 | HR 78 | Temp 97.4°F | Resp 18 | Ht 61.0 in | Wt 137.0 lb

## 2018-02-27 DIAGNOSIS — Z853 Personal history of malignant neoplasm of breast: Secondary | ICD-10-CM

## 2018-02-27 DIAGNOSIS — C801 Malignant (primary) neoplasm, unspecified: Secondary | ICD-10-CM | POA: Diagnosis not present

## 2018-02-27 DIAGNOSIS — Z8521 Personal history of malignant neoplasm of larynx: Secondary | ICD-10-CM

## 2018-02-27 DIAGNOSIS — Z171 Estrogen receptor negative status [ER-]: Principal | ICD-10-CM

## 2018-02-27 DIAGNOSIS — Z7952 Long term (current) use of systemic steroids: Secondary | ICD-10-CM

## 2018-02-27 DIAGNOSIS — C50511 Malignant neoplasm of lower-outer quadrant of right female breast: Secondary | ICD-10-CM

## 2018-02-27 DIAGNOSIS — Z79899 Other long term (current) drug therapy: Secondary | ICD-10-CM

## 2018-02-27 DIAGNOSIS — C7951 Secondary malignant neoplasm of bone: Secondary | ICD-10-CM

## 2018-02-27 DIAGNOSIS — R5383 Other fatigue: Secondary | ICD-10-CM

## 2018-02-27 DIAGNOSIS — Z5111 Encounter for antineoplastic chemotherapy: Secondary | ICD-10-CM

## 2018-02-27 DIAGNOSIS — C320 Malignant neoplasm of glottis: Secondary | ICD-10-CM

## 2018-02-27 DIAGNOSIS — Z87891 Personal history of nicotine dependence: Secondary | ICD-10-CM

## 2018-02-27 DIAGNOSIS — R5381 Other malaise: Secondary | ICD-10-CM

## 2018-02-27 DIAGNOSIS — Z923 Personal history of irradiation: Secondary | ICD-10-CM

## 2018-02-27 DIAGNOSIS — R21 Rash and other nonspecific skin eruption: Secondary | ICD-10-CM

## 2018-02-27 LAB — CBC WITH DIFFERENTIAL/PLATELET
Abs Immature Granulocytes: 0.04 10*3/uL (ref 0.00–0.07)
BASOS ABS: 0 10*3/uL (ref 0.0–0.1)
Basophils Relative: 0 %
Eosinophils Absolute: 0 10*3/uL (ref 0.0–0.5)
Eosinophils Relative: 0 %
HCT: 39 % (ref 36.0–46.0)
HEMOGLOBIN: 12.9 g/dL (ref 12.0–15.0)
Immature Granulocytes: 0 %
LYMPHS PCT: 4 %
Lymphs Abs: 0.4 10*3/uL — ABNORMAL LOW (ref 0.7–4.0)
MCH: 32.7 pg (ref 26.0–34.0)
MCHC: 33.1 g/dL (ref 30.0–36.0)
MCV: 98.7 fL (ref 80.0–100.0)
Monocytes Absolute: 0.1 10*3/uL (ref 0.1–1.0)
Monocytes Relative: 2 %
NEUTROS ABS: 8.9 10*3/uL — AB (ref 1.7–7.7)
NEUTROS PCT: 94 %
NRBC: 0 % (ref 0.0–0.2)
Platelets: 219 10*3/uL (ref 150–400)
RBC: 3.95 MIL/uL (ref 3.87–5.11)
RDW: 12.8 % (ref 11.5–15.5)
WBC: 9.4 10*3/uL (ref 4.0–10.5)

## 2018-02-27 LAB — COMPREHENSIVE METABOLIC PANEL
ALT: 25 U/L (ref 0–44)
ANION GAP: 9 (ref 5–15)
AST: 34 U/L (ref 15–41)
Albumin: 3.8 g/dL (ref 3.5–5.0)
Alkaline Phosphatase: 70 U/L (ref 38–126)
BUN: 14 mg/dL (ref 8–23)
CHLORIDE: 105 mmol/L (ref 98–111)
CO2: 21 mmol/L — ABNORMAL LOW (ref 22–32)
Calcium: 9.3 mg/dL (ref 8.9–10.3)
Creatinine, Ser: 0.67 mg/dL (ref 0.44–1.00)
Glucose, Bld: 206 mg/dL — ABNORMAL HIGH (ref 70–99)
POTASSIUM: 3.9 mmol/L (ref 3.5–5.1)
Sodium: 135 mmol/L (ref 135–145)
Total Bilirubin: 0.6 mg/dL (ref 0.3–1.2)
Total Protein: 6.8 g/dL (ref 6.5–8.1)

## 2018-02-27 MED ORDER — DEXAMETHASONE SODIUM PHOSPHATE 10 MG/ML IJ SOLN
10.0000 mg | Freq: Once | INTRAMUSCULAR | Status: AC
  Administered 2018-02-27: 10 mg via INTRAVENOUS
  Filled 2018-02-27: qty 1

## 2018-02-27 MED ORDER — SODIUM CHLORIDE 0.9 % IV SOLN
Freq: Once | INTRAVENOUS | Status: AC
  Administered 2018-02-27: 13:00:00 via INTRAVENOUS
  Filled 2018-02-27: qty 250

## 2018-02-27 MED ORDER — SODIUM CHLORIDE 0.9 % IV SOLN
600.0000 mg/m2 | Freq: Once | INTRAVENOUS | Status: AC
  Administered 2018-02-27: 1000 mg via INTRAVENOUS
  Filled 2018-02-27: qty 50

## 2018-02-27 MED ORDER — HEPARIN SOD (PORK) LOCK FLUSH 100 UNIT/ML IV SOLN
500.0000 [IU] | Freq: Once | INTRAVENOUS | Status: AC | PRN
  Administered 2018-02-27: 500 [IU]
  Filled 2018-02-27: qty 5

## 2018-02-27 MED ORDER — PEGFILGRASTIM 6 MG/0.6ML ~~LOC~~ PSKT
6.0000 mg | PREFILLED_SYRINGE | Freq: Once | SUBCUTANEOUS | Status: AC
  Administered 2018-02-27: 6 mg via SUBCUTANEOUS
  Filled 2018-02-27: qty 0.6

## 2018-02-27 MED ORDER — PALONOSETRON HCL INJECTION 0.25 MG/5ML
0.2500 mg | Freq: Once | INTRAVENOUS | Status: AC
  Administered 2018-02-27: 0.25 mg via INTRAVENOUS
  Filled 2018-02-27: qty 5

## 2018-02-27 MED ORDER — SODIUM CHLORIDE 0.9 % IV SOLN: 10.0000 mg | Freq: Once | INTRAVENOUS | Status: DC

## 2018-02-27 MED ORDER — SODIUM CHLORIDE 0.9 % IV SOLN
60.0000 mg/m2 | Freq: Once | INTRAVENOUS | Status: AC
  Administered 2018-02-27: 100 mg via INTRAVENOUS
  Filled 2018-02-27: qty 10

## 2018-02-27 NOTE — Progress Notes (Signed)
Hematology/Oncology Consult note [facility]  [phone] [phone]  Patient Care Team: Chrismon, Vickki Muff, PA as PCP - General (Physician Assistant)   Name of the patient: Denise Watts  [id number]  [birth year]   Date of visit: 02/27/18  Diagnosis- 1.. H/o breast cancer in 1997 2. H/o stage I SCC of vocal cord in 2015. 3. Lung nodules4. Adrenal carcinoma of unknown primary s/p resection5. Stage IaT1a NxcM0 ER weakly positive 1-10%, PR negative and her 2 negative   Chief complaint/ Reason for visit- on treatment assessment prior to cycle 2 of TC chemotherapy  Heme/Onc history:  Oncology History   1. Carcinoma of breast, T1, N0, M0 tumor diagnosis.  In January of 1997 2. Carcinoma of the vocal cord T1, N0, M0 tumor.  Status postradiation therapy 3. Abnormal CT scan of the chest (December, 2015) 4. Repeat CT scan of chest shows stable nodule (July, 2016) 5. Neutropenia with normal hemoglobin and normal platelet count (July, 2016)     Cancer of vocal cord [facility])   11/10/2014 Initial Diagnosis    Cancer of vocal cord     Malignant neoplasm of lower-outer quadrant of right breast of female, estrogen receptor negative ([place])   11/24/2017 Initial Diagnosis    Malignant neoplasm of lower-outer quadrant of right breast of female, estrogen receptor negative ([place])    01/11/2018 -  Chemotherapy    The patient had palonosetron (ALOXI) injection 0.25 mg, 0.25 mg, Intravenous,  Once, 2 of 4 cycles Administration: 0.25 mg (01/30/2018), 0.25 mg (02/27/2018) pegfilgrastim (NEULASTA ONPRO KIT) injection 6 mg, 6 mg, Subcutaneous, Once, 2 of 4 cycles Administration: 6 mg (01/30/2018) cyclophosphamide (CYTOXAN) 1,000 mg in sodium chloride 0.9 % 250 mL chemo infusion, 600 mg/m2 = 1,000 mg, Intravenous,  Once, 2 of 4 cycles Administration: 1,000 mg (01/30/2018), 1,000 mg (02/27/2018) DOCEtaxel (TAXOTERE) 130 mg in sodium chloride 0.9 % 250 mL chemo infusion,  75 mg/m2 = 130 mg, Intravenous,  Once, 2 of 4 cycles Dose modification: 60 mg/m2 (original dose 75 mg/m2, Cycle 2, Reason: Dose not tolerated) Administration: 130 mg (01/30/2018), 100 mg (02/27/2018)  for chemotherapy treatment.     01/12/2018 Cancer Staging    Staging form: Breast, AJCC 8th Edition - Clinical stage from 01/12/2018: Stage IB (cT1a, cN0, cM0, G3, ER+, PR-, HER2-) - Signed by Sindy Guadeloupe, MD on 01/12/2018    The patient was originally diagnosed with right breast cancer in 1997,10 mm grade 3, ER pr positive her 2 negative, node negativestatus post surgery and radiation treatment only. She never received chemotherapy or hormone therapy. For her vocal cord cancer, she had extensive surgery and radiation therapy in 1998. Due to past history of smoking, she was also noted to have lung nodules. She is undergoing active surveillance imaging study of the chest.  Patient noted to have adrenal mass on CT chest which prompted PET CT in July 2018 which showed:IMPRESSION: 1. Right adrenal mass with peripheral hypermetabolism. Given interval development since 11/18/2015, suspicious for either isolated metastasis or a primary adrenal neoplasm. This should be considered for biopsy. 2. No evidence of hypermetabolic primary malignancy or extraadrenal metastasis. Pulmonary nodules are not significantly hypermetabolic. 3. Coronary artery atherosclerosis. Aortic Atherosclerosis (ICD10-I70.0). 4. Hepatic steatosis. 5. Left sixth rib hypermetabolism is favored to be related to remote Trauma.  Patient underwent right adrenalectomy which showed:DIAGNOSIS:  A. ADRENAL GLAND, RIGHT; ADRENALECTOMY:  - HIGH GRADE CARCINOMA WITH EXTENSIVE NECROSIS.  - MARGINS OF THE SPECIMEN ARE NEGATIVE FOR  CARCINOMA.   Comment:  Sections demonstrate abundant necrosis confined to the adrenal medullary  compartment. In one block of tissue a small fragment of viable malignant  neoplasm was identified. A panel  of immunohistochemical stains was  performed with the following pattern of immunoreactivity:  Super pancytokeratin: Positive  GATA-3: Negative  TTF-1: Negative  P40: Negative  Napsin: Negative  PAX-8: Negative  Chromogranin: Negative   Repeat CT chest abdomen and pelvis in July 2019 showed stable left lower lobe pulmonary nodule. No other evidence of metastatic disease. New 4.5 mm nodule in the right breast. This was followed by ultrasound mammogram and core biopsy of the breast lesion which was a grade 3 triple negative breast cancer. Patient was seen by Dr. Bary Castilla and underwent lumpectomy on 12/08/2017.  Patient had a 5 mm weakly ER positive tumor. Given her ongoing fatigue Oncotype testing was done to see if she could potentially avoid chemotherapy. Oncotype shows a recurrence score of 53 with her risk of distant recurrence at 9 years at greater than 39% with hormone therapyalone.Absolute benefit of chemotherapy was greater than 15% in her age group.   Interval history- she tolerated cycle 1 of chemotherapy poorly. She had severe oral thrush, difficulty swallowing and fatigue. Also developed skin rash over b/l forearms requiring steroids. Chemo was delayed for a week. Today she feels her rash is better. Ritta Slot has resolved. She still reports some pain during swallowing. Reports some changes in her voice.   ECOG PS- 1-2 Pain scale- 4 Opioid associated constipation- no  Review of systems- Review of Systems  Constitutional: Positive for malaise/fatigue. Negative for chills, fever and weight loss.  HENT: Negative for congestion, ear discharge and nosebleeds.   Eyes: Negative for blurred vision.  Respiratory: Negative for cough, hemoptysis, sputum production, shortness of breath and wheezing.   Cardiovascular: Negative for chest pain, palpitations, orthopnea and claudication.  Gastrointestinal: Negative for abdominal pain, blood in stool, constipation, diarrhea, heartburn,  melena, nausea and vomiting.  Genitourinary: Negative for dysuria, flank pain, frequency, hematuria and urgency.  Musculoskeletal: Negative for back pain, joint pain and myalgias.  Skin: Positive for rash.  Neurological: Negative for dizziness, tingling, focal weakness, seizures, weakness and headaches.  Endo/Heme/Allergies: Does not bruise/bleed easily.  Psychiatric/Behavioral: Negative for depression and suicidal ideas. The patient does not have insomnia.      No Known Allergies   Past Medical History:  Diagnosis Date  . Arthritis    KNEE RIGHT  . Breast cancer [facility]) 1997   right breast lumpectomy with radiation  . Breast cancer ([place]) 11/15/2017   INVASIVE MAMMARY CARCINOMA/ triple negative  . 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[place]) 11/10/2014  . Cough 03/02/2017   INTERMTTENT DRY COUGH  . GERD (gastroesophageal reflux disease)   . History of hiatal hernia   . Personal history of radiation therapy   . Skin cancer   . Throat cancer [facility]) 1998   radiation     Past Surgical History:  Procedure Laterality Date  . BREAST BIOPSY Right 1997   positive  . BREAST BIOPSY Right 11/15/2017   INVASIVE MAMMARY CARCINOMA triple negative  . BREAST LUMPECTOMY Right 1997  . BREAST LUMPECTOMY WITH SENTINEL LYMPH NODE BIOPSY Right 12/08/2017   Procedure: BREAST LUMPECTOMY WITH SENTINEL LYMPH NODE BX;  Surgeon: Robert Bellow, MD;  Location: [facility] ORS;  Service: General;  Laterality: Right;  . COLONOSCOPY  2015  . ESOPHAGUS SURGERY    . KNEE SURGERY Right   . PORTACATH PLACEMENT Right 01/17/2018  Procedure: INSERTION PORT-A-CATH- RIGHT;  Surgeon: Robert Bellow, MD;  Location: [facility] ORS;  Service: General;  Laterality: Right;  . ROBOTIC ADRENALECTOMY Right 12/28/2016   Procedure: ROBOTIC ADRENALECTOMY;  Surgeon: Hollice Espy, MD;  Location: [facility] ORS;  Service: Urology;  Laterality: Right;  . SKIN CANCER EXCISION    . THROAT SURGERY  1998   throat cancer   . TUBAL LIGATION    . VENTRAL HERNIA  REPAIR N/A 03/03/2017   Procedure: HERNIA REPAIR VENTRAL ADULT;  Surgeon: Clayburn Pert, MD;  Location: [facility] ORS;  Service: General;  Laterality: N/A;    Social History   Socioeconomic History  . Marital status: Married    Spouse name: Not on file  . Number of children: Not on file  . Years of education: Not on file  . Highest education level: Not on file  Occupational History  . Not on file  Social Needs  . Financial resource strain: Not on file  . Food insecurity:    Worry: Not on file    Inability: Not on file  . Transportation needs:    Medical: Not on file    Non-medical: Not on file  Tobacco Use  . Smoking status: Former Smoker    Packs/day: 1.50    Years: 30.00    Pack years: 45.00    Types: Cigarettes    Last attempt to quit: 05/02/1996    Years since quitting: 21.8  . Smokeless tobacco: Never Used  Substance and Sexual Activity  . Alcohol use: Yes    Alcohol/week: 0.0 standard drinks    Comment: OCC  . Drug use: No  . Sexual activity: Not on file  Lifestyle  . Physical activity:    Days per week: Not on file    Minutes per session: Not on file  . Stress: Not on file  Relationships  . Social connections:    Talks on phone: Not on file    Gets together: Not on file    Attends religious service: Not on file    Active member of club or organization: Not on file    Attends meetings of clubs or organizations: Not on file    Relationship status: Not on file  . Intimate partner violence:    Fear of current or ex partner: Not on file    Emotionally abused: Not on file    Physically abused: Not on file    Forced sexual activity: Not on file  Other Topics Concern  . Not on file  Social History Narrative  . Not on file    Family History  Problem Relation Age of Onset  . Diabetes Mother   . Heart attack Mother   . Cancer Sister        cancer of eyes, spread to lung, other places. died at 42  . Cancer Sister 71       unknown primary, spread to bone,  brain died in 18's  . Cancer Paternal Aunt        unk type  . Cancer Paternal Grandmother        type unk  . Prostate cancer Neg Hx   . Kidney cancer Neg Hx   . Bladder Cancer Neg Hx      Current Outpatient Medications:  .  clotrimazole (MYCELEX) 10 MG troche, Take 1 tablet (10 mg total) by mouth 5 (five) times daily., Disp: 50 tablet, Rfl: 0 .  Cyanocobalamin (B-12 COMPLIANCE INJECTION IJ), Inject 1 Dose as directed every 30 (  thirty) days., Disp: , Rfl:  .  gabapentin (NEURONTIN) 300 MG capsule, Take 1 capsule (300 mg total) by mouth 3 (three) times daily., Disp: 90 capsule, Rfl: 0 .  HYDROcodone-acetaminophen (NORCO/VICODIN) 5-325 MG tablet, Take 1 tablet by mouth every 4 (four) hours as needed for moderate pain., Disp: 20 tablet, Rfl: 0 .  hydrocortisone cream 0.5 %, Apply 1 application topically 2 (two) times daily., Disp: 30 g, Rfl: 0 .  morphine (ROXANOL) 20 MG/ML concentrated solution, Take 0.5 mLs (10 mg total) by mouth every 3 (three) hours as needed for severe pain., Disp: 240 mL, Rfl: 0 .  omeprazole (PRILOSEC) 40 MG capsule, TAKE 1 CAPSULE BY MOUTH EVERY DAY-AM, Disp: 30 capsule, Rfl: 11 .  traMADol (ULTRAM) 50 MG tablet, Take 1 tablet (50 mg total) by mouth every 6 (six) hours as needed., Disp: 30 tablet, Rfl: 0 .  ciprofloxacin (CIPRO) 500 MG tablet, Take 1 tablet (500 mg total) by mouth 2 (two) times daily. (Patient not taking: Reported on 02/27/2018), Disp: 20 tablet, Rfl: 0 .  dexamethasone (DECADRON) 4 MG tablet, Take 2 tablets (8 mg total) by mouth 2 (two) times daily. Start the day before Taxotere. Then again the day after chemo for 3 days. (Patient not taking: Reported on 02/13/2018), Disp: 30 tablet, Rfl: 1 .  lidocaine-prilocaine (EMLA) cream, Apply 1 application topically as needed. (Patient not taking: Reported on 02/13/2018), Disp: 30 g, Rfl: 0 .  LORazepam (ATIVAN) 0.5 MG tablet, Take 1 tablet (0.5 mg total) by mouth every 6 (six) hours as needed (Nausea or  vomiting). (Patient not taking: Reported on 02/13/2018), Disp: 30 tablet, Rfl: 0 .  magic mouthwash w/lidocaine SOLN, Take 5 mLs by mouth 4 (four) times daily. (Patient not taking: Reported on 02/27/2018), Disp: 180 mL, Rfl: 0 .  naproxen sodium (ALEVE) 220 MG tablet, Take 220 mg by mouth daily as needed (pain). , Disp: , Rfl:  .  ondansetron (ZOFRAN ODT) 8 MG disintegrating tablet, Take 1 tablet (8 mg total) by mouth every 8 (eight) hours as needed for nausea or vomiting. (Patient not taking: Reported on 02/27/2018), Disp: 20 tablet, Rfl: 0 .  ondansetron (ZOFRAN) 8 MG tablet, Take 1 tablet (8 mg total) by mouth 2 (two) times daily as needed for refractory nausea / vomiting. Start on day 3 after chemo. (Patient not taking: Reported on 02/13/2018), Disp: 30 tablet, Rfl: 1 .  predniSONE (STERAPRED UNI-PAK 21 TAB) 10 MG (21) TBPK tablet, 60 mg day 1 decrease by 10 mg daily to 0 (Patient not taking: Reported on 02/27/2018), Disp: 21 tablet, Rfl: 0 .  prochlorperazine (COMPAZINE) 10 MG tablet, Take 1 tablet (10 mg total) by mouth every 6 (six) hours as needed (Nausea or vomiting). (Patient not taking: Reported on 02/13/2018), Disp: 30 tablet, Rfl: 1 No current facility-administered medications for this visit.   Facility-Administered Medications Ordered in Other Visits:  .  heparin lock flush 100 unit/mL, 500 Units, Intracatheter, Once PRN, Sindy Guadeloupe, MD  Physical exam:  Vitals:   02/27/18 1123  BP: 138/86  Pulse: 78  Resp: 18  Temp: (!) 97.4 F (36.3 C)  TempSrc: Tympanic  SpO2: 97%  Weight: 137 lb (62.1 kg)  Height: _0  (1.549 m)   Physical Exam  Constitutional: She is oriented to person, place, and time.  She is fatigued. No acute distress  HENT:  Head: Normocephalic and atraumatic.  Mouth/Throat: Oropharynx is clear and moist.  Eyes: Pupils are equal, round, and reactive to light.  EOM are normal.  Neck: Normal range of motion.  Cardiovascular: Normal rate, regular rhythm and  normal heart sounds.  Pulmonary/Chest: Effort normal and breath sounds normal.  Abdominal: Soft. Bowel sounds are normal.  Neurological: She is alert and oriented to person, place, and time.  Skin: Skin is warm and dry.  Stigmata of prior skin rash and healing erythema noted over b/l forearms     CMP Latest Ref Rng & Units 02/27/2018  Glucose 70 - 99 mg/dL 206(H)  BUN 8 - 23 mg/dL 14  Creatinine 0.44 - 1.00 mg/dL 0.67  Sodium 135 - 145 mmol/L 135  Potassium 3.5 - 5.1 mmol/L 3.9  Chloride 98 - 111 mmol/L 105  CO2 22 - 32 mmol/L 21(L)  Calcium 8.9 - 10.3 mg/dL 9.3  Total Protein 6.5 - 8.1 g/dL 6.8  Total Bilirubin 0.3 - 1.2 mg/dL 0.6  Alkaline Phos 38 - 126 U/L 70  AST 15 - 41 U/L 34  ALT 0 - 44 U/L 25   CBC Latest Ref Rng & Units 02/27/2018  WBC 4.0 - 10.5 K/uL 9.4  Hemoglobin 12.0 - 15.0 g/dL 12.9  Hematocrit 36.0 - 46.0 % 39.0  Platelets 150 - 400 K/uL 219    No images are attached to the encounter.  US Breast Complete Uni Right Inc Axilla  Result Date: 02/13/2018 Ultrasound examination of the surgical site was completed due to the patient's persistent discomfort.  No evidence of loculated fluid or seroma.  A small cyst is noted beneath the incision with a maximum diameter of 0.86 cm.  No deep seroma cavity identified.    Assessment and plan- Patient is a 62 y.o. 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 Final path showed 5 mm grade 3 Er weakly positive 1-10%, pr negative and her 2 negative tumor.Oncotype came back as high risk.  She is here for on treatment assessment prior to cycle 2 of adjuvant TC chemotherapy.  Patient tolerated cycle 1 chemotherapy poorly and therefore it was delayed by 1 week.  She is now 4  weeks out of her first cycle.  We discussed stopping treatment at this time versus trying a lower dose to see if she will tolerate that better.  Patient is willing to consider second cycle of chemotherapy at a lower dose.  I will therefore dose reduce her docetaxel to 60 mg/m.  She will continue to get Cytoxan at the same dose.  She will also get on pro-Neulasta.  If she is unable to tolerate treatment every 3 weeks we may have to give it every 4 weeks.  Skin rash: Improved after a course of steroids.  Continue to monitor  I will see her back in 1 week's time with CBC and CMP to see how she is tolerating her chemotherapy.  I will also add her for possible fluids at that time.  We will tentatively schedule her cycle 3 of TC chemotherapy on 03/27/2018   Visit Diagnosis 1. Encounter for antineoplastic chemotherapy   2. Malignant neoplasm of lower-outer quadrant of right breast of female, estrogen receptor negative ([place])      Dr. Randa Evens, MD, MPH [facility] [phone] 02/27/2018 3:25 PM

## 2018-03-02 ENCOUNTER — Other Ambulatory Visit: Payer: Self-pay

## 2018-03-02 DIAGNOSIS — C7491 Malignant neoplasm of unspecified part of right adrenal gland: Secondary | ICD-10-CM

## 2018-03-06 ENCOUNTER — Inpatient Hospital Stay: Payer: No Typology Code available for payment source

## 2018-03-06 ENCOUNTER — Inpatient Hospital Stay: Payer: No Typology Code available for payment source | Attending: Oncology

## 2018-03-06 ENCOUNTER — Encounter: Payer: Self-pay | Admitting: Oncology

## 2018-03-06 ENCOUNTER — Inpatient Hospital Stay (HOSPITAL_BASED_OUTPATIENT_CLINIC_OR_DEPARTMENT_OTHER): Payer: No Typology Code available for payment source | Admitting: Oncology

## 2018-03-06 VITALS — BP 111/76 | HR 94 | Temp 98.2°F | Resp 18 | Ht 61.0 in | Wt 130.8 lb

## 2018-03-06 DIAGNOSIS — Z5111 Encounter for antineoplastic chemotherapy: Secondary | ICD-10-CM | POA: Insufficient documentation

## 2018-03-06 DIAGNOSIS — E860 Dehydration: Secondary | ICD-10-CM | POA: Diagnosis not present

## 2018-03-06 DIAGNOSIS — R630 Anorexia: Secondary | ICD-10-CM | POA: Insufficient documentation

## 2018-03-06 DIAGNOSIS — Z87891 Personal history of nicotine dependence: Secondary | ICD-10-CM | POA: Insufficient documentation

## 2018-03-06 DIAGNOSIS — Z853 Personal history of malignant neoplasm of breast: Secondary | ICD-10-CM | POA: Diagnosis not present

## 2018-03-06 DIAGNOSIS — M549 Dorsalgia, unspecified: Secondary | ICD-10-CM | POA: Diagnosis not present

## 2018-03-06 DIAGNOSIS — Z85828 Personal history of other malignant neoplasm of skin: Secondary | ICD-10-CM | POA: Insufficient documentation

## 2018-03-06 DIAGNOSIS — Z7689 Persons encountering health services in other specified circumstances: Secondary | ICD-10-CM | POA: Insufficient documentation

## 2018-03-06 DIAGNOSIS — R110 Nausea: Secondary | ICD-10-CM

## 2018-03-06 DIAGNOSIS — C7491 Malignant neoplasm of unspecified part of right adrenal gland: Secondary | ICD-10-CM

## 2018-03-06 DIAGNOSIS — Z79899 Other long term (current) drug therapy: Secondary | ICD-10-CM

## 2018-03-06 DIAGNOSIS — Z171 Estrogen receptor negative status [ER-]: Secondary | ICD-10-CM

## 2018-03-06 DIAGNOSIS — R5383 Other fatigue: Secondary | ICD-10-CM | POA: Diagnosis not present

## 2018-03-06 DIAGNOSIS — C7951 Secondary malignant neoplasm of bone: Secondary | ICD-10-CM | POA: Diagnosis not present

## 2018-03-06 DIAGNOSIS — R634 Abnormal weight loss: Secondary | ICD-10-CM | POA: Diagnosis not present

## 2018-03-06 DIAGNOSIS — Z923 Personal history of irradiation: Secondary | ICD-10-CM | POA: Insufficient documentation

## 2018-03-06 DIAGNOSIS — E871 Hypo-osmolality and hyponatremia: Secondary | ICD-10-CM

## 2018-03-06 DIAGNOSIS — R5381 Other malaise: Secondary | ICD-10-CM | POA: Insufficient documentation

## 2018-03-06 DIAGNOSIS — K219 Gastro-esophageal reflux disease without esophagitis: Secondary | ICD-10-CM | POA: Diagnosis not present

## 2018-03-06 DIAGNOSIS — Z8521 Personal history of malignant neoplasm of larynx: Secondary | ICD-10-CM

## 2018-03-06 DIAGNOSIS — C50411 Malignant neoplasm of upper-outer quadrant of right female breast: Secondary | ICD-10-CM | POA: Diagnosis not present

## 2018-03-06 DIAGNOSIS — C7971 Secondary malignant neoplasm of right adrenal gland: Secondary | ICD-10-CM | POA: Insufficient documentation

## 2018-03-06 DIAGNOSIS — I2510 Atherosclerotic heart disease of native coronary artery without angina pectoris: Secondary | ICD-10-CM | POA: Insufficient documentation

## 2018-03-06 DIAGNOSIS — C50511 Malignant neoplasm of lower-outer quadrant of right female breast: Secondary | ICD-10-CM

## 2018-03-06 LAB — CBC WITH DIFFERENTIAL/PLATELET
Abs Immature Granulocytes: 2.1 10*3/uL — ABNORMAL HIGH (ref 0.00–0.07)
BASOS ABS: 0 10*3/uL (ref 0.0–0.1)
Basophils Relative: 0 %
EOS PCT: 0 %
Eosinophils Absolute: 0 10*3/uL (ref 0.0–0.5)
HEMATOCRIT: 40.7 % (ref 36.0–46.0)
HEMOGLOBIN: 13.5 g/dL (ref 12.0–15.0)
Immature Granulocytes: 18 %
LYMPHS ABS: 0.8 10*3/uL (ref 0.7–4.0)
Lymphocytes Relative: 7 %
MCH: 32.1 pg (ref 26.0–34.0)
MCHC: 33.2 g/dL (ref 30.0–36.0)
MCV: 96.7 fL (ref 80.0–100.0)
MONO ABS: 1.5 10*3/uL — AB (ref 0.1–1.0)
MONOS PCT: 13 %
NRBC: 0.6 % — AB (ref 0.0–0.2)
Neutro Abs: 7.4 10*3/uL (ref 1.7–7.7)
Neutrophils Relative %: 62 %
Platelets: 141 10*3/uL — ABNORMAL LOW (ref 150–400)
RBC: 4.21 MIL/uL (ref 3.87–5.11)
RDW: 12.4 % (ref 11.5–15.5)
WBC: 11.9 10*3/uL — ABNORMAL HIGH (ref 4.0–10.5)

## 2018-03-06 LAB — COMPREHENSIVE METABOLIC PANEL
ALT: 20 U/L (ref 0–44)
ANION GAP: 6 (ref 5–15)
AST: 22 U/L (ref 15–41)
Albumin: 3.8 g/dL (ref 3.5–5.0)
Alkaline Phosphatase: 76 U/L (ref 38–126)
BUN: 18 mg/dL (ref 8–23)
CHLORIDE: 101 mmol/L (ref 98–111)
CO2: 23 mmol/L (ref 22–32)
CREATININE: 0.75 mg/dL (ref 0.44–1.00)
Calcium: 8.7 mg/dL — ABNORMAL LOW (ref 8.9–10.3)
Glucose, Bld: 144 mg/dL — ABNORMAL HIGH (ref 70–99)
Potassium: 3.8 mmol/L (ref 3.5–5.1)
SODIUM: 130 mmol/L — AB (ref 135–145)
Total Bilirubin: 0.7 mg/dL (ref 0.3–1.2)
Total Protein: 6.6 g/dL (ref 6.5–8.1)

## 2018-03-06 MED ORDER — HEPARIN SOD (PORK) LOCK FLUSH 100 UNIT/ML IV SOLN
500.0000 [IU] | Freq: Once | INTRAVENOUS | Status: AC
  Administered 2018-03-06: 500 [IU] via INTRAVENOUS
  Filled 2018-03-06: qty 5

## 2018-03-06 MED ORDER — SODIUM CHLORIDE 0.9 % IV SOLN: 10.0000 mg | Freq: Once | INTRAVENOUS | Status: DC

## 2018-03-06 MED ORDER — SODIUM CHLORIDE 0.9% FLUSH
10.0000 mL | INTRAVENOUS | Status: DC | PRN
  Administered 2018-03-06: 10 mL via INTRAVENOUS
  Filled 2018-03-06: qty 10

## 2018-03-06 MED ORDER — PROCHLORPERAZINE EDISYLATE 10 MG/2ML IJ SOLN
10.0000 mg | Freq: Once | INTRAMUSCULAR | Status: AC
  Administered 2018-03-06: 10 mg via INTRAVENOUS
  Filled 2018-03-06: qty 2

## 2018-03-06 MED ORDER — SODIUM CHLORIDE 0.9 % IV SOLN
Freq: Once | INTRAVENOUS | Status: AC
  Administered 2018-03-06: 15:00:00 via INTRAVENOUS
  Filled 2018-03-06: qty 250

## 2018-03-06 MED ORDER — DEXAMETHASONE SODIUM PHOSPHATE 10 MG/ML IJ SOLN
10.0000 mg | Freq: Once | INTRAMUSCULAR | Status: AC
  Administered 2018-03-06: 10 mg via INTRAVENOUS
  Filled 2018-03-06: qty 1

## 2018-03-06 NOTE — Progress Notes (Signed)
Hematology/Oncology Consult note [facility]  [phone] [phone]  Patient Care Team: Chrismon, Vickki Muff, PA as PCP - General (Physician Assistant)   Name of the patient: Denise Watts  [id number]  Jan 17, 1956   Date of visit: 03/06/18  Diagnosis- 1.. H/o breast cancer in 1997 2. H/o stage I SCC of vocal cord in 2015. 3. Lung nodules4. Adrenal carcinoma of unknown primary s/p resection5. Stage IaT1a NxcM0 ER weakly positive 1-10%, PR negative and her 2 negative   Chief complaint/ Reason for visit-toxicity check after cycle 2 of TC chemotherapy  Heme/Onc history:  Oncology History   1. Carcinoma of breast, T1, N0, M0 tumor diagnosis.  In January of 1997 2. Carcinoma of the vocal cord T1, N0, M0 tumor.  Status postradiation therapy 3. Abnormal CT scan of the chest (December, 2015) 4. Repeat CT scan of chest shows stable nodule (July, 2016) 5. Neutropenia with normal hemoglobin and normal platelet count (July, 2016)     Cancer of vocal cord [facility])   11/10/2014 Initial Diagnosis    Cancer of vocal cord     Malignant neoplasm of lower-outer quadrant of right breast of female, estrogen receptor negative ([place])   11/24/2017 Initial Diagnosis    Malignant neoplasm of lower-outer quadrant of right breast of female, estrogen receptor negative ([place])    01/11/2018 -  Chemotherapy    The patient had palonosetron (ALOXI) injection 0.25 mg, 0.25 mg, Intravenous,  Once, 2 of 4 cycles Administration: 0.25 mg (01/30/2018), 0.25 mg (02/27/2018) pegfilgrastim (NEULASTA ONPRO KIT) injection 6 mg, 6 mg, Subcutaneous, Once, 2 of 4 cycles Administration: 6 mg (01/30/2018) cyclophosphamide (CYTOXAN) 1,000 mg in sodium chloride 0.9 % 250 mL chemo infusion, 600 mg/m2 = 1,000 mg, Intravenous,  Once, 2 of 4 cycles Administration: 1,000 mg (01/30/2018), 1,000 mg (02/27/2018) DOCEtaxel (TAXOTERE) 130 mg in sodium chloride 0.9 % 250 mL chemo infusion, 75 mg/m2 =  130 mg, Intravenous,  Once, 2 of 4 cycles Dose modification: 60 mg/m2 (original dose 75 mg/m2, Cycle 2, Reason: Dose not tolerated) Administration: 130 mg (01/30/2018), 100 mg (02/27/2018)  for chemotherapy treatment.     01/12/2018 Cancer Staging    Staging form: Breast, AJCC 8th Edition - Clinical stage from 01/12/2018: Stage IB (cT1a, cN0, cM0, G3, ER+, PR-, HER2-) - Signed by Sindy Guadeloupe, MD on 01/12/2018     The patient was originally diagnosed with right breast cancer in 1997,10 mm grade 3, ER pr positive her 2 negative, node negativestatus post surgery and radiation treatment only. She never received chemotherapy or hormone therapy. For her vocal cord cancer, she had extensive surgery and radiation therapy in 1998. Due to past history of smoking, she was also noted to have lung nodules. She is undergoing active surveillance imaging study of the chest.  Patient noted to have adrenal mass on CT chest which prompted PET CT in July 2018 which showed:IMPRESSION: 1. Right adrenal mass with peripheral hypermetabolism. Given interval development since 11/18/2015, suspicious for either isolated metastasis or a primary adrenal neoplasm. This should be considered for biopsy. 2. No evidence of hypermetabolic primary malignancy or extraadrenal metastasis. Pulmonary nodules are not significantly hypermetabolic. 3. Coronary artery atherosclerosis. Aortic Atherosclerosis (ICD10-I70.0). 4. Hepatic steatosis. 5. Left sixth rib hypermetabolism is favored to be related to remote Trauma.  Patient underwent right adrenalectomy which showed:DIAGNOSIS:  A. ADRENAL GLAND, RIGHT; ADRENALECTOMY:  - HIGH GRADE CARCINOMA WITH EXTENSIVE NECROSIS.  - MARGINS OF THE SPECIMEN ARE NEGATIVE FOR CARCINOMA.  Comment:  Sections demonstrate abundant necrosis confined to the adrenal medullary  compartment. In one block of tissue a small fragment of viable malignant  neoplasm was identified. A panel of  immunohistochemical stains was  performed with the following pattern of immunoreactivity:  Super pancytokeratin: Positive  GATA-3: Negative  TTF-1: Negative  P40: Negative  Napsin: Negative  PAX-8: Negative  Chromogranin: Negative   Repeat CT chest abdomen and pelvis in July 2019 showed stable left lower lobe pulmonary nodule. No other evidence of metastatic disease. New 4.5 mm nodule in the right breast. This was followed by ultrasound mammogram and core biopsy of the breast lesion which was a grade 3 triple negative breast cancer. Patient was seen by Dr. Bary Castilla and underwent lumpectomy on 12/08/2017.  Patient had a 5 mm weakly ER positive tumor. Given her ongoing fatigue Oncotype testing was done to see if she could potentially avoid chemotherapy. Oncotype shows a recurrence score of 53 with her risk of distant recurrence at 9 years at greater than 39% with hormone therapyalone.Absolute benefit of chemotherapy was greater than 15% in her age group.   Interval history-she feels that she tolerated cycle 2 of chemotherapy better than the first 1.  She still feels fatigued and is barely eating anything.  She has lost about 10 to 12 pounds over the last 2 months.  She feels as if the food gets stuck in her throat and she is unable to get it down.  She has been having some back pain as well as pain during swallowing for which she has been taking PRN morphine but feels that it makes her nauseous.  ECOG PS- 2 Pain scale- 5 Opioid associated constipation- no  Review of systems- Review of Systems  Constitutional: Positive for malaise/fatigue and weight loss. Negative for chills and fever.  HENT: Negative for congestion, ear discharge and nosebleeds.        Throat pain  Eyes: Negative for blurred vision.  Respiratory: Negative for cough, hemoptysis, sputum production, shortness of breath and wheezing.   Cardiovascular: Negative for chest pain, palpitations, orthopnea and claudication.    Gastrointestinal: Negative for abdominal pain, blood in stool, constipation, diarrhea, heartburn, melena, nausea and vomiting.  Genitourinary: Negative for dysuria, flank pain, frequency, hematuria and urgency.  Musculoskeletal: Positive for back pain. Negative for joint pain and myalgias.  Skin: Negative for rash.  Neurological: Negative for dizziness, tingling, focal weakness, seizures, weakness and headaches.  Endo/Heme/Allergies: Does not bruise/bleed easily.  Psychiatric/Behavioral: Negative for depression and suicidal ideas. The patient does not have insomnia.       No Known Allergies   Past Medical History:  Diagnosis Date  . Arthritis    KNEE RIGHT  . Breast cancer [facility]) 1997   right breast lumpectomy with radiation  . Breast cancer ([place]) 11/15/2017   INVASIVE MAMMARY CARCINOMA/ triple negative  . 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[place]) 11/10/2014  . Cough 03/02/2017   INTERMTTENT DRY COUGH  . GERD (gastroesophageal reflux disease)   . History of hiatal hernia   . Personal history of radiation therapy   . Skin cancer   . Throat cancer [facility]) 1998   radiation     Past Surgical History:  Procedure Laterality Date  . BREAST BIOPSY Right 1997   positive  . BREAST BIOPSY Right 11/15/2017   INVASIVE MAMMARY CARCINOMA triple negative  . BREAST LUMPECTOMY Right 1997  . BREAST LUMPECTOMY WITH SENTINEL LYMPH NODE BIOPSY Right 12/08/2017   Procedure: BREAST LUMPECTOMY WITH SENTINEL LYMPH NODE  BX;  Surgeon: Robert Bellow, MD;  Location: [facility] ORS;  Service: General;  Laterality: Right;  . COLONOSCOPY  2015  . ESOPHAGUS SURGERY    . KNEE SURGERY Right   . PORTACATH PLACEMENT Right 01/17/2018   Procedure: INSERTION PORT-A-CATH- RIGHT;  Surgeon: Robert Bellow, MD;  Location: [facility] ORS;  Service: General;  Laterality: Right;  . ROBOTIC ADRENALECTOMY Right 12/28/2016   Procedure: ROBOTIC ADRENALECTOMY;  Surgeon: Hollice Espy, MD;  Location: [facility] ORS;  Service: Urology;  Laterality:  Right;  . SKIN CANCER EXCISION    . THROAT SURGERY  1998   throat cancer   . TUBAL LIGATION    . VENTRAL HERNIA REPAIR N/A 03/03/2017   Procedure: HERNIA REPAIR VENTRAL ADULT;  Surgeon: Clayburn Pert, MD;  Location: [facility] ORS;  Service: General;  Laterality: N/A;    Social History   Socioeconomic History  . Marital status: Married    Spouse name: Not on file  . Number of children: Not on file  . Years of education: Not on file  . Highest education level: Not on file  Occupational History  . Not on file  Social Needs  . Financial resource strain: Not on file  . Food insecurity:    Worry: Not on file    Inability: Not on file  . Transportation needs:    Medical: Not on file    Non-medical: Not on file  Tobacco Use  . Smoking status: Former Smoker    Packs/day: 1.50    Years: 30.00    Pack years: 45.00    Types: Cigarettes    Last attempt to quit: 05/02/1996    Years since quitting: 21.8  . Smokeless tobacco: Never Used  Substance and Sexual Activity  . Alcohol use: Yes    Alcohol/week: 0.0 standard drinks    Comment: OCC  . Drug use: No  . Sexual activity: Not on file  Lifestyle  . Physical activity:    Days per week: Not on file    Minutes per session: Not on file  . Stress: Not on file  Relationships  . Social connections:    Talks on phone: Not on file    Gets together: Not on file    Attends religious service: Not on file    Active member of club or organization: Not on file    Attends meetings of clubs or organizations: Not on file    Relationship status: Not on file  . Intimate partner violence:    Fear of current or ex partner: Not on file    Emotionally abused: Not on file    Physically abused: Not on file    Forced sexual activity: Not on file  Other Topics Concern  . Not on file  Social History Narrative  . Not on file    Family History  Problem Relation Age of Onset  . Diabetes Mother   . Heart attack Mother   . Cancer Sister         cancer of eyes, spread to lung, other places. died at 37  . Cancer Sister 58       unknown primary, spread to bone, brain died in 73's  . Cancer Paternal Aunt        unk type  . Cancer Paternal Grandmother        type unk  . Prostate cancer Neg Hx   . Kidney cancer Neg Hx   . Bladder Cancer Neg Hx  Current Outpatient Medications:  .  Cyanocobalamin (B-12 COMPLIANCE INJECTION IJ), Inject 1 Dose as directed every 30 (thirty) days., Disp: , Rfl:  .  morphine (ROXANOL) 20 MG/ML concentrated solution, Take 0.5 mLs (10 mg total) by mouth every 3 (three) hours as needed for severe pain., Disp: 240 mL, Rfl: 0 .  omeprazole (PRILOSEC) 40 MG capsule, TAKE 1 CAPSULE BY MOUTH EVERY DAY-AM, Disp: 30 capsule, Rfl: 11 .  ondansetron (ZOFRAN ODT) 8 MG disintegrating tablet, Take 1 tablet (8 mg total) by mouth every 8 (eight) hours as needed for nausea or vomiting., Disp: 20 tablet, Rfl: 0 .  ondansetron (ZOFRAN) 8 MG tablet, Take 1 tablet (8 mg total) by mouth 2 (two) times daily as needed for refractory nausea / vomiting. Start on day 3 after chemo., Disp: 30 tablet, Rfl: 1 .  ciprofloxacin (CIPRO) 500 MG tablet, Take 1 tablet (500 mg total) by mouth 2 (two) times daily. (Patient not taking: Reported on 02/27/2018), Disp: 20 tablet, Rfl: 0 .  clotrimazole (MYCELEX) 10 MG troche, Take 1 tablet (10 mg total) by mouth 5 (five) times daily. (Patient not taking: Reported on 03/06/2018), Disp: 50 tablet, Rfl: 0 .  dexamethasone (DECADRON) 4 MG tablet, Take 2 tablets (8 mg total) by mouth 2 (two) times daily. Start the day before Taxotere. Then again the day after chemo for 3 days. (Patient not taking: Reported on 02/13/2018), Disp: 30 tablet, Rfl: 1 .  gabapentin (NEURONTIN) 300 MG capsule, Take 1 capsule (300 mg total) by mouth 3 (three) times daily. (Patient not taking: Reported on 03/06/2018), Disp: 90 capsule, Rfl: 0 .  HYDROcodone-acetaminophen (NORCO/VICODIN) 5-325 MG tablet, Take 1 tablet by mouth every 4  (four) hours as needed for moderate pain. (Patient not taking: Reported on 03/06/2018), Disp: 20 tablet, Rfl: 0 .  hydrocortisone cream 0.5 %, Apply 1 application topically 2 (two) times daily. (Patient not taking: Reported on 03/06/2018), Disp: 30 g, Rfl: 0 .  lidocaine-prilocaine (EMLA) cream, Apply 1 application topically as needed. (Patient not taking: Reported on 02/13/2018), Disp: 30 g, Rfl: 0 .  LORazepam (ATIVAN) 0.5 MG tablet, Take 1 tablet (0.5 mg total) by mouth every 6 (six) hours as needed (Nausea or vomiting). (Patient not taking: Reported on 02/13/2018), Disp: 30 tablet, Rfl: 0 .  magic mouthwash w/lidocaine SOLN, Take 5 mLs by mouth 4 (four) times daily. (Patient not taking: Reported on 02/27/2018), Disp: 180 mL, Rfl: 0 .  naproxen sodium (ALEVE) 220 MG tablet, Take 220 mg by mouth daily as needed (pain). , Disp: , Rfl:  .  predniSONE (STERAPRED UNI-PAK 21 TAB) 10 MG (21) TBPK tablet, 60 mg day 1 decrease by 10 mg daily to 0 (Patient not taking: Reported on 02/27/2018), Disp: 21 tablet, Rfl: 0 .  prochlorperazine (COMPAZINE) 10 MG tablet, Take 1 tablet (10 mg total) by mouth every 6 (six) hours as needed (Nausea or vomiting). (Patient not taking: Reported on 02/13/2018), Disp: 30 tablet, Rfl: 1 .  traMADol (ULTRAM) 50 MG tablet, Take 1 tablet (50 mg total) by mouth every 6 (six) hours as needed. (Patient not taking: Reported on 03/06/2018), Disp: 30 tablet, Rfl: 0 No current facility-administered medications for this visit.   Facility-Administered Medications Ordered in Other Visits:  .  sodium chloride flush (NS) 0.9 % injection 10 mL, 10 mL, Intravenous, PRN, Sindy Guadeloupe, MD, 10 mL at 03/06/18 1320  Physical exam:  Vitals:   03/06/18 1341  BP: 111/76  Pulse: 94  Resp: 18  Temp:  98.2 F (36.8 C)  TempSrc: Tympanic  SpO2: 97%  Weight: 130 lb 12.8 oz (59.3 kg)  Height: _0  (1.549 m)   Physical Exam  Constitutional: She is oriented to person, place, and time.  Patient is  thin and fatigued.  HENT:  Head: Normocephalic and atraumatic.  Mouth/Throat: Oropharynx is clear and moist.  Eyes: Pupils are equal, round, and reactive to light. EOM are normal.  Neck: Normal range of motion.  Cardiovascular: Normal rate, regular rhythm and normal heart sounds.  Pulmonary/Chest: Effort normal and breath sounds normal.  Abdominal: Soft. Bowel sounds are normal.  Neurological: She is alert and oriented to person, place, and time.  Skin: Skin is warm and dry.     CMP Latest Ref Rng & Units 03/06/2018  Glucose 70 - 99 mg/dL 144(H)  BUN 8 - 23 mg/dL 18  Creatinine 0.44 - 1.00 mg/dL 0.75  Sodium 135 - 145 mmol/L 130(L)  Potassium 3.5 - 5.1 mmol/L 3.8  Chloride 98 - 111 mmol/L 101  CO2 22 - 32 mmol/L 23  Calcium 8.9 - 10.3 mg/dL 8.7(L)  Total Protein 6.5 - 8.1 g/dL 6.6  Total Bilirubin 0.3 - 1.2 mg/dL 0.7  Alkaline Phos 38 - 126 U/L 76  AST 15 - 41 U/L 22  ALT 0 - 44 U/L 20   CBC Latest Ref Rng & Units 03/06/2018  WBC 4.0 - 10.5 K/uL 11.9(H)  Hemoglobin 12.0 - 15.0 g/dL 13.5  Hematocrit 36.0 - 46.0 % 40.7  Platelets 150 - 400 K/uL 141(L)    No images are attached to the encounter.  US Breast Complete Uni Right Inc Axilla  Result Date: 02/13/2018 Ultrasound examination of the surgical site was completed due to the patient's persistent discomfort.  No evidence of loculated fluid or seroma.  A small cyst is noted beneath the incision with a maximum diameter of 0.86 cm.  No deep seroma cavity identified.    Assessment and plan- Patient is a 62 y.o. 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 Final path showed 5 mm grade 3 Er weakly positive 1-10%, pr negative and her 2 negative tumor.Oncotype came back as  high risk.  She is status post 2 cycles of TC chemotherapy and is here for toxicity check 1 week after cycle 2  I had dose reduce her docetaxel to 60 mg/m for cycle 2.  1 week after cycle 2 of chemotherapy she continues to be fatigued and has significant weight loss over the last 2 months.  Her oral intake has not been good either.  I have encouraged her to drink Ensure 2-3 times a day to see if she can improve her protein intake.  If she continues to have ongoing weight loss I will refer her to nutrition as well.  She does not have any evidence of thrush today and her counts are stable.  She does have some hyponatremia likely secondary to dehydration and we will plan to give her 1 L of IV fluids along with 10 mg of IV Decadron.  She also reports feeling nauseous and I will give her 10 mg of IV Compazine today.  I will see her back in 2 weeks time with CBC and CMP for cycle 3 of adjuvant  TC chemotherapy but I will have a low threshold to stop it if she continues to do poorly   Visit Diagnosis 1. Abnormal weight loss   2. Fatigue, unspecified type   3. Hyponatremia   4. Dehydration      Dr. Randa Evens, MD, MPH [facility] [phone] 03/06/2018 4:02 PM

## 2018-03-06 NOTE — Progress Notes (Signed)
Patient c/o itchy and burning in vaginal area x 4 -5 days

## 2018-03-19 ENCOUNTER — Other Ambulatory Visit: Payer: Self-pay

## 2018-03-19 DIAGNOSIS — C320 Malignant neoplasm of glottis: Secondary | ICD-10-CM

## 2018-03-20 ENCOUNTER — Encounter: Payer: Self-pay | Admitting: Oncology

## 2018-03-20 ENCOUNTER — Inpatient Hospital Stay: Payer: No Typology Code available for payment source

## 2018-03-20 ENCOUNTER — Inpatient Hospital Stay (HOSPITAL_BASED_OUTPATIENT_CLINIC_OR_DEPARTMENT_OTHER): Payer: No Typology Code available for payment source | Admitting: Oncology

## 2018-03-20 VITALS — BP 113/76 | HR 90 | Temp 97.8°F | Resp 18 | Ht 61.0 in | Wt 139.1 lb

## 2018-03-20 DIAGNOSIS — C50511 Malignant neoplasm of lower-outer quadrant of right female breast: Secondary | ICD-10-CM

## 2018-03-20 DIAGNOSIS — Z853 Personal history of malignant neoplasm of breast: Secondary | ICD-10-CM

## 2018-03-20 DIAGNOSIS — R5383 Other fatigue: Secondary | ICD-10-CM

## 2018-03-20 DIAGNOSIS — Z79899 Other long term (current) drug therapy: Secondary | ICD-10-CM

## 2018-03-20 DIAGNOSIS — Z5111 Encounter for antineoplastic chemotherapy: Secondary | ICD-10-CM | POA: Diagnosis not present

## 2018-03-20 DIAGNOSIS — C7951 Secondary malignant neoplasm of bone: Secondary | ICD-10-CM | POA: Diagnosis not present

## 2018-03-20 DIAGNOSIS — R5381 Other malaise: Secondary | ICD-10-CM

## 2018-03-20 DIAGNOSIS — C320 Malignant neoplasm of glottis: Secondary | ICD-10-CM

## 2018-03-20 DIAGNOSIS — Z923 Personal history of irradiation: Secondary | ICD-10-CM

## 2018-03-20 DIAGNOSIS — C7971 Secondary malignant neoplasm of right adrenal gland: Secondary | ICD-10-CM | POA: Diagnosis not present

## 2018-03-20 DIAGNOSIS — K219 Gastro-esophageal reflux disease without esophagitis: Secondary | ICD-10-CM

## 2018-03-20 DIAGNOSIS — E538 Deficiency of other specified B group vitamins: Secondary | ICD-10-CM

## 2018-03-20 DIAGNOSIS — Z8521 Personal history of malignant neoplasm of larynx: Secondary | ICD-10-CM

## 2018-03-20 DIAGNOSIS — Z85828 Personal history of other malignant neoplasm of skin: Secondary | ICD-10-CM

## 2018-03-20 DIAGNOSIS — Z87891 Personal history of nicotine dependence: Secondary | ICD-10-CM

## 2018-03-20 DIAGNOSIS — Z171 Estrogen receptor negative status [ER-]: Secondary | ICD-10-CM

## 2018-03-20 LAB — COMPREHENSIVE METABOLIC PANEL
ALK PHOS: 63 U/L (ref 38–126)
ALT: 14 U/L (ref 0–44)
AST: 23 U/L (ref 15–41)
Albumin: 3.5 g/dL (ref 3.5–5.0)
Anion gap: 7 (ref 5–15)
BUN: 8 mg/dL (ref 8–23)
CALCIUM: 8.9 mg/dL (ref 8.9–10.3)
CO2: 26 mmol/L (ref 22–32)
CREATININE: 0.54 mg/dL (ref 0.44–1.00)
Chloride: 108 mmol/L (ref 98–111)
Glucose, Bld: 111 mg/dL — ABNORMAL HIGH (ref 70–99)
Potassium: 3.8 mmol/L (ref 3.5–5.1)
Sodium: 141 mmol/L (ref 135–145)
TOTAL PROTEIN: 6.3 g/dL — AB (ref 6.5–8.1)
Total Bilirubin: 0.5 mg/dL (ref 0.3–1.2)

## 2018-03-20 LAB — CBC WITH DIFFERENTIAL/PLATELET
Abs Immature Granulocytes: 0.03 10*3/uL (ref 0.00–0.07)
BASOS ABS: 0 10*3/uL (ref 0.0–0.1)
BASOS PCT: 1 %
EOS ABS: 0 10*3/uL (ref 0.0–0.5)
EOS PCT: 1 %
HEMATOCRIT: 34.8 % — AB (ref 36.0–46.0)
Hemoglobin: 11.2 g/dL — ABNORMAL LOW (ref 12.0–15.0)
Immature Granulocytes: 1 %
Lymphocytes Relative: 17 %
Lymphs Abs: 0.9 10*3/uL (ref 0.7–4.0)
MCH: 32.5 pg (ref 26.0–34.0)
MCHC: 32.2 g/dL (ref 30.0–36.0)
MCV: 100.9 fL — ABNORMAL HIGH (ref 80.0–100.0)
Monocytes Absolute: 0.5 10*3/uL (ref 0.1–1.0)
Monocytes Relative: 11 %
NRBC: 0 % (ref 0.0–0.2)
Neutro Abs: 3.6 10*3/uL (ref 1.7–7.7)
Neutrophils Relative %: 69 %
PLATELETS: 245 10*3/uL (ref 150–400)
RBC: 3.45 MIL/uL — AB (ref 3.87–5.11)
RDW: 13.7 % (ref 11.5–15.5)
WBC: 5.1 10*3/uL (ref 4.0–10.5)

## 2018-03-20 MED ORDER — CYANOCOBALAMIN 1000 MCG/ML IJ SOLN
1000.0000 ug | INTRAMUSCULAR | Status: DC
  Administered 2018-03-20: 1000 ug via INTRAMUSCULAR
  Filled 2018-03-20: qty 1

## 2018-03-20 MED ORDER — SODIUM CHLORIDE 0.9% FLUSH
10.0000 mL | INTRAVENOUS | Status: DC | PRN
  Administered 2018-03-20: 10 mL via INTRAVENOUS
  Filled 2018-03-20: qty 10

## 2018-03-20 MED ORDER — HEPARIN SOD (PORK) LOCK FLUSH 100 UNIT/ML IV SOLN
500.0000 [IU] | Freq: Once | INTRAVENOUS | Status: AC
  Administered 2018-03-20: 500 [IU] via INTRAVENOUS
  Filled 2018-03-20: qty 5

## 2018-03-20 NOTE — Progress Notes (Signed)
swelling to bilateral feet and ankles x 2-3 days

## 2018-03-20 NOTE — Progress Notes (Signed)
Hematology/Oncology Consult note [facility]  [phone] [phone]  Patient Care Team: Chrismon, Vickki Muff, PA as PCP - General (Physician Assistant)   Name of the patient: Denise Watts  [id number]  April 28, 1956   Date of visit: 03/20/18  Diagnosis- 1.. H/o breast cancer in 1997 2. H/o stage I SCC of vocal cord in 2015. 3. Lung nodules4. Adrenal carcinoma of unknown primary s/p resection5. Stage IaT1a NxcM0 ER weakly positive 1-10%, PR negative and her 2 negative   Chief complaint/ Reason for visit-on treatment assessment prior to cycle 3 of adjuvant TC chemotherapy  Heme/Onc history:  Oncology History   1. Carcinoma of breast, T1, N0, M0 tumor diagnosis.  In January of 1997 2. Carcinoma of the vocal cord T1, N0, M0 tumor.  Status postradiation therapy 3. Abnormal CT scan of the chest (December, 2015) 4. Repeat CT scan of chest shows stable nodule (July, 2016) 5. Neutropenia with normal hemoglobin and normal platelet count (July, 2016)     Cancer of vocal cord [facility])   11/10/2014 Initial Diagnosis    Cancer of vocal cord     Malignant neoplasm of lower-outer quadrant of right breast of female, estrogen receptor negative ([place])   11/24/2017 Initial Diagnosis    Malignant neoplasm of lower-outer quadrant of right breast of female, estrogen receptor negative ([place])    01/11/2018 -  Chemotherapy    The patient had palonosetron (ALOXI) injection 0.25 mg, 0.25 mg, Intravenous,  Once, 2 of 4 cycles Administration: 0.25 mg (01/30/2018), 0.25 mg (02/27/2018) pegfilgrastim (NEULASTA ONPRO KIT) injection 6 mg, 6 mg, Subcutaneous, Once, 2 of 4 cycles Administration: 6 mg (01/30/2018), 6 mg (02/27/2018) cyclophosphamide (CYTOXAN) 1,000 mg in sodium chloride 0.9 % 250 mL chemo infusion, 600 mg/m2 = 1,000 mg, Intravenous,  Once, 2 of 4 cycles Administration: 1,000 mg (01/30/2018), 1,000 mg (02/27/2018) DOCEtaxel (TAXOTERE) 130 mg in sodium chloride  0.9 % 250 mL chemo infusion, 75 mg/m2 = 130 mg, Intravenous,  Once, 2 of 4 cycles Dose modification: 60 mg/m2 (original dose 75 mg/m2, Cycle 2, Reason: Dose not tolerated) Administration: 130 mg (01/30/2018), 100 mg (02/27/2018)  for chemotherapy treatment.     01/12/2018 Cancer Staging    Staging form: Breast, AJCC 8th Edition - Clinical stage from 01/12/2018: Stage IB (cT1a, cN0, cM0, G3, ER+, PR-, HER2-) - Signed by Sindy Guadeloupe, MD on 01/12/2018     The patient was originally diagnosed with right breast cancer in 1997,10 mm grade 3, ER pr positive her 2 negative, node negativestatus post surgery and radiation treatment only. She never received chemotherapy or hormone therapy. For her vocal cord cancer, she had extensive surgery and radiation therapy in 1998. Due to past history of smoking, she was also noted to have lung nodules. She is undergoing active surveillance imaging study of the chest.  Patient noted to have adrenal mass on CT chest which prompted PET CT in July 2018 which showed:IMPRESSION: 1. Right adrenal mass with peripheral hypermetabolism. Given interval development since 11/18/2015, suspicious for either isolated metastasis or a primary adrenal neoplasm. This should be considered for biopsy. 2. No evidence of hypermetabolic primary malignancy or extraadrenal metastasis. Pulmonary nodules are not significantly hypermetabolic. 3. Coronary artery atherosclerosis. Aortic Atherosclerosis (ICD10-I70.0). 4. Hepatic steatosis. 5. Left sixth rib hypermetabolism is favored to be related to remote Trauma.  Patient underwent right adrenalectomy which showed:DIAGNOSIS:  A. ADRENAL GLAND, RIGHT; ADRENALECTOMY:  - HIGH GRADE CARCINOMA WITH EXTENSIVE NECROSIS.  - MARGINS OF THE  SPECIMEN ARE NEGATIVE FOR CARCINOMA.   Comment:  Sections demonstrate abundant necrosis confined to the adrenal medullary  compartment. In one block of tissue a small fragment of viable malignant    neoplasm was identified. A panel of immunohistochemical stains was  performed with the following pattern of immunoreactivity:  Super pancytokeratin: Positive  GATA-3: Negative  TTF-1: Negative  P40: Negative  Napsin: Negative  PAX-8: Negative  Chromogranin: Negative   Repeat CT chest abdomen and pelvis in July 2019 showed stable left lower lobe pulmonary nodule. No other evidence of metastatic disease. New 4.5 mm nodule in the right breast. This was followed by ultrasound mammogram and core biopsy of the breast lesion which was a grade 3 triple negative breast cancer. Patient was seen by Dr. Bary Castilla and underwent lumpectomy on 12/08/2017.  Patient had a 5 mm weakly ER positive tumor. Given her ongoing fatigue Oncotype testing was done to see if she could potentially avoid chemotherapy. Oncotype shows a recurrence score of 53 with her risk of distant recurrence at 9 years at greater than 39% with hormone therapyalone.Absolute benefit of chemotherapy was greater than 15% in her age group.  Interval history-she reports feeling better today than she has in the last few months.  Her appetite is improving.  Denies any pain or mouth sores.  She is still fatigued but overall better  ECOG PS- 2 Pain scale- 0 Opioid associated constipation- no  Review of systems- Review of Systems  Constitutional: Positive for malaise/fatigue. Negative for chills, fever and weight loss.  HENT: Negative for congestion, ear discharge and nosebleeds.   Eyes: Negative for blurred vision.  Respiratory: Negative for cough, hemoptysis, sputum production, shortness of breath and wheezing.   Cardiovascular: Negative for chest pain, palpitations, orthopnea and claudication.  Gastrointestinal: Negative for abdominal pain, blood in stool, constipation, diarrhea, heartburn, melena, nausea and vomiting.  Genitourinary: Negative for dysuria, flank pain, frequency, hematuria and urgency.  Musculoskeletal: Negative for  back pain, joint pain and myalgias.  Skin: Negative for rash.  Neurological: Negative for dizziness, tingling, focal weakness, seizures, weakness and headaches.  Endo/Heme/Allergies: Does not bruise/bleed easily.  Psychiatric/Behavioral: Negative for depression and suicidal ideas. The patient does not have insomnia.       No Known Allergies   Past Medical History:  Diagnosis Date  . Arthritis    KNEE RIGHT  . Breast cancer [facility]) 1997   right breast lumpectomy with radiation  . Breast cancer ([place]) 11/15/2017   INVASIVE MAMMARY CARCINOMA/ triple negative  . 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[place]) 11/10/2014  . Cough 03/02/2017   INTERMTTENT DRY COUGH  . GERD (gastroesophageal reflux disease)   . History of hiatal hernia   . Personal history of radiation therapy   . Skin cancer   . Throat cancer [facility]) 1998   radiation     Past Surgical History:  Procedure Laterality Date  . BREAST BIOPSY Right 1997   positive  . BREAST BIOPSY Right 11/15/2017   INVASIVE MAMMARY CARCINOMA triple negative  . BREAST LUMPECTOMY Right 1997  . BREAST LUMPECTOMY WITH SENTINEL LYMPH NODE BIOPSY Right 12/08/2017   Procedure: BREAST LUMPECTOMY WITH SENTINEL LYMPH NODE BX;  Surgeon: Robert Bellow, MD;  Location: [facility] ORS;  Service: General;  Laterality: Right;  . COLONOSCOPY  2015  . ESOPHAGUS SURGERY    . KNEE SURGERY Right   . PORTACATH PLACEMENT Right 01/17/2018   Procedure: INSERTION PORT-A-CATH- RIGHT;  Surgeon: Robert Bellow, MD;  Location: [facility] ORS;  Service: General;  Laterality: Right;  . ROBOTIC ADRENALECTOMY Right 12/28/2016   Procedure: ROBOTIC ADRENALECTOMY;  Surgeon: Hollice Espy, MD;  Location: [facility] ORS;  Service: Urology;  Laterality: Right;  . SKIN CANCER EXCISION    . THROAT SURGERY  1998   throat cancer   . TUBAL LIGATION    . VENTRAL HERNIA REPAIR N/A 03/03/2017   Procedure: HERNIA REPAIR VENTRAL ADULT;  Surgeon: Clayburn Pert, MD;  Location: [facility] ORS;  Service: General;   Laterality: N/A;    Social History   Socioeconomic History  . Marital status: Married    Spouse name: Not on file  . Number of children: Not on file  . Years of education: Not on file  . Highest education level: Not on file  Occupational History  . Not on file  Social Needs  . Financial resource strain: Not on file  . Food insecurity:    Worry: Not on file    Inability: Not on file  . Transportation needs:    Medical: Not on file    Non-medical: Not on file  Tobacco Use  . Smoking status: Former Smoker    Packs/day: 1.50    Years: 30.00    Pack years: 45.00    Types: Cigarettes    Last attempt to quit: 05/02/1996    Years since quitting: 21.8  . Smokeless tobacco: Never Used  Substance and Sexual Activity  . Alcohol use: Yes    Alcohol/week: 0.0 standard drinks    Comment: OCC  . Drug use: No  . Sexual activity: Not on file  Lifestyle  . Physical activity:    Days per week: Not on file    Minutes per session: Not on file  . Stress: Not on file  Relationships  . Social connections:    Talks on phone: Not on file    Gets together: Not on file    Attends religious service: Not on file    Active member of club or organization: Not on file    Attends meetings of clubs or organizations: Not on file    Relationship status: Not on file  . Intimate partner violence:    Fear of current or ex partner: Not on file    Emotionally abused: Not on file    Physically abused: Not on file    Forced sexual activity: Not on file  Other Topics Concern  . Not on file  Social History Narrative  . Not on file    Family History  Problem Relation Age of Onset  . Diabetes Mother   . Heart attack Mother   . Cancer Sister        cancer of eyes, spread to lung, other places. died at 73  . Cancer Sister 30       unknown primary, spread to bone, brain died in 47's  . Cancer Paternal Aunt        unk type  . Cancer Paternal Grandmother        type unk  . Prostate cancer Neg Hx   .  Kidney cancer Neg Hx   . Bladder Cancer Neg Hx      Current Outpatient Medications:  .  Cyanocobalamin (B-12 COMPLIANCE INJECTION IJ), Inject 1 Dose as directed every 30 (thirty) days., Disp: , Rfl:  .  morphine (ROXANOL) 20 MG/ML concentrated solution, Take 0.5 mLs (10 mg total) by mouth every 3 (three) hours as needed for severe pain., Disp: 240 mL, Rfl: 0 .  omeprazole (PRILOSEC) 40 MG  capsule, TAKE 1 CAPSULE BY MOUTH EVERY DAY-AM, Disp: 30 capsule, Rfl: 11 .  ondansetron (ZOFRAN) 8 MG tablet, Take 1 tablet (8 mg total) by mouth 2 (two) times daily as needed for refractory nausea / vomiting. Start on day 3 after chemo., Disp: 30 tablet, Rfl: 1 .  ciprofloxacin (CIPRO) 500 MG tablet, Take 1 tablet (500 mg total) by mouth 2 (two) times daily. (Patient not taking: Reported on 02/27/2018), Disp: 20 tablet, Rfl: 0 .  clotrimazole (MYCELEX) 10 MG troche, Take 1 tablet (10 mg total) by mouth 5 (five) times daily. (Patient not taking: Reported on 03/06/2018), Disp: 50 tablet, Rfl: 0 .  dexamethasone (DECADRON) 4 MG tablet, Take 2 tablets (8 mg total) by mouth 2 (two) times daily. Start the day before Taxotere. Then again the day after chemo for 3 days. (Patient not taking: Reported on 02/13/2018), Disp: 30 tablet, Rfl: 1 .  gabapentin (NEURONTIN) 300 MG capsule, Take 1 capsule (300 mg total) by mouth 3 (three) times daily. (Patient not taking: Reported on 03/06/2018), Disp: 90 capsule, Rfl: 0 .  HYDROcodone-acetaminophen (NORCO/VICODIN) 5-325 MG tablet, Take 1 tablet by mouth every 4 (four) hours as needed for moderate pain. (Patient not taking: Reported on 03/06/2018), Disp: 20 tablet, Rfl: 0 .  hydrocortisone cream 0.5 %, Apply 1 application topically 2 (two) times daily. (Patient not taking: Reported on 03/06/2018), Disp: 30 g, Rfl: 0 .  lidocaine-prilocaine (EMLA) cream, Apply 1 application topically as needed. (Patient not taking: Reported on 02/13/2018), Disp: 30 g, Rfl: 0 .  LORazepam (ATIVAN) 0.5 MG  tablet, Take 1 tablet (0.5 mg total) by mouth every 6 (six) hours as needed (Nausea or vomiting). (Patient not taking: Reported on 02/13/2018), Disp: 30 tablet, Rfl: 0 .  magic mouthwash w/lidocaine SOLN, Take 5 mLs by mouth 4 (four) times daily. (Patient not taking: Reported on 02/27/2018), Disp: 180 mL, Rfl: 0 .  naproxen sodium (ALEVE) 220 MG tablet, Take 220 mg by mouth daily as needed (pain). , Disp: , Rfl:  .  ondansetron (ZOFRAN ODT) 8 MG disintegrating tablet, Take 1 tablet (8 mg total) by mouth every 8 (eight) hours as needed for nausea or vomiting. (Patient not taking: Reported on 03/20/2018), Disp: 20 tablet, Rfl: 0 .  predniSONE (STERAPRED UNI-PAK 21 TAB) 10 MG (21) TBPK tablet, 60 mg day 1 decrease by 10 mg daily to 0 (Patient not taking: Reported on 02/27/2018), Disp: 21 tablet, Rfl: 0 .  prochlorperazine (COMPAZINE) 10 MG tablet, Take 1 tablet (10 mg total) by mouth every 6 (six) hours as needed (Nausea or vomiting). (Patient not taking: Reported on 02/13/2018), Disp: 30 tablet, Rfl: 1 .  traMADol (ULTRAM) 50 MG tablet, Take 1 tablet (50 mg total) by mouth every 6 (six) hours as needed. (Patient not taking: Reported on 03/06/2018), Disp: 30 tablet, Rfl: 0 No current facility-administered medications for this visit.   Facility-Administered Medications Ordered in Other Visits:  .  cyanocobalamin ((VITAMIN B-12)) injection 1,000 mcg, 1,000 mcg, Intramuscular, Q30 days, Sindy Guadeloupe, MD .  heparin lock flush 100 unit/mL, 500 Units, Intravenous, Once, Sindy Guadeloupe, MD .  sodium chloride flush (NS) 0.9 % injection 10 mL, 10 mL, Intravenous, PRN, Sindy Guadeloupe, MD, 10 mL at 03/20/18 1054  Physical exam:  Vitals:   03/20/18 1104  BP: 113/76  Pulse: 90  Resp: 18  Temp: 97.8 F (36.6 C)  SpO2: 97%  Weight: 139 lb 1.6 oz (63.1 kg)  Height: 5' 1"  (1.549 m)  Physical Exam  Constitutional: She is oriented to person, place, and time.  Thin female who appears fatigued  HENT:  Head:  Normocephalic and atraumatic.  Mouth/Throat: Oropharynx is clear and moist.  Eyes: Pupils are equal, round, and reactive to light. EOM are normal.  Neck: Normal range of motion.  Cardiovascular: Normal rate, regular rhythm and normal heart sounds.  Pulmonary/Chest: Effort normal and breath sounds normal.  Abdominal: Soft. Bowel sounds are normal.  Neurological: She is alert and oriented to person, place, and time.  Skin: Skin is warm and dry.     CMP Latest Ref Rng & Units 03/20/2018  Glucose 70 - 99 mg/dL 111(H)  BUN 8 - 23 mg/dL 8  Creatinine 0.44 - 1.00 mg/dL 0.54  Sodium 135 - 145 mmol/L 141  Potassium 3.5 - 5.1 mmol/L 3.8  Chloride 98 - 111 mmol/L 108  CO2 22 - 32 mmol/L 26  Calcium 8.9 - 10.3 mg/dL 8.9  Total Protein 6.5 - 8.1 g/dL 6.3(L)  Total Bilirubin 0.3 - 1.2 mg/dL 0.5  Alkaline Phos 38 - 126 U/L 63  AST 15 - 41 U/L 23  ALT 0 - 44 U/L 14   CBC Latest Ref Rng & Units 03/20/2018  WBC 4.0 - 10.5 K/uL 5.1  Hemoglobin 12.0 - 15.0 g/dL 11.2(L)  Hematocrit 36.0 - 46.0 % 34.8(L)  Platelets 150 - 400 K/uL 245      Assessment and plan- Patient is a 62 y.o. 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 Final path showed 5 mm grade 3 Er weakly positive 1-10%, pr negative and her 2 negative tumor.Oncotype came back as high risk.  She is here for on treatment assessment for cycle 3 of adjuvant TC chemotherapy  Counts are okay to proceed with cycle 3 of TC chemotherapy today.  However patient states that she typically feels worse for the 4 to 7 days post chemotherapy which coincides with Thanksgiving.  Patient has dinner plans with her entire family and therefore does not wish to get chemotherapy today.  Her chemo will be  postponed by 1 week and she will return to clinic on 03/28/2018 to receive her cycle 3 of TC chemotherapy.  I will see her back in 4 weeks time on 04/19/18 for cycle 4 of adjuvant TC chemotherapy.  She will continue to get docetaxel at reduced dose of 60 mg/m.  I will also see her 2 weeks from now with a BMP for possible fluids 2 weeks post chemotherapy.  I will plan to get repeat CT chest abdomen and pelvis after 4 cycles of chemo   Visit Diagnosis 1. Encounter for antineoplastic chemotherapy   2. Malignant neoplasm of lower-outer quadrant of right breast of female, estrogen receptor negative ([place])      Dr. Randa Evens, MD, MPH [facility] [phone] 03/20/2018 12:17 PM

## 2018-03-22 ENCOUNTER — Telehealth: Payer: Self-pay | Admitting: *Deleted

## 2018-03-22 ENCOUNTER — Inpatient Hospital Stay: Payer: No Typology Code available for payment source

## 2018-03-22 NOTE — Telephone Encounter (Signed)
Called patient's home and got her voicemail.  Wanted to let her know that they did get the appointment set up to do her Taxotere and Cytoxan next week on 11/27.  Her arrival will be at 8 AM to do labs and then go to infusion.  Also made an appointment for December 6 at 1030 for labs, 11:00 to see the doctor, and 1130 if she needs fluids.  Her last appointment would be December 19 at 9 AM for labs, 930 to see the doctor, 10:00 infusion.  I left on the message that she could please call me back and confirm that she did get this message and she will be coming on 11/27. I have also put a copy in the mail for her to.

## 2018-03-27 ENCOUNTER — Other Ambulatory Visit: Payer: PRIVATE HEALTH INSURANCE

## 2018-03-27 ENCOUNTER — Ambulatory Visit: Payer: PRIVATE HEALTH INSURANCE

## 2018-03-27 ENCOUNTER — Ambulatory Visit: Payer: PRIVATE HEALTH INSURANCE | Admitting: Oncology

## 2018-03-28 ENCOUNTER — Inpatient Hospital Stay: Payer: No Typology Code available for payment source

## 2018-03-28 VITALS — BP 110/72 | HR 71 | Temp 96.9°F | Resp 18

## 2018-03-28 DIAGNOSIS — C50511 Malignant neoplasm of lower-outer quadrant of right female breast: Secondary | ICD-10-CM

## 2018-03-28 DIAGNOSIS — Z171 Estrogen receptor negative status [ER-]: Secondary | ICD-10-CM

## 2018-03-28 DIAGNOSIS — C320 Malignant neoplasm of glottis: Secondary | ICD-10-CM

## 2018-03-28 DIAGNOSIS — Z5111 Encounter for antineoplastic chemotherapy: Secondary | ICD-10-CM | POA: Diagnosis not present

## 2018-03-28 LAB — CBC WITH DIFFERENTIAL/PLATELET
Abs Immature Granulocytes: 0.02 10*3/uL (ref 0.00–0.07)
BASOS ABS: 0 10*3/uL (ref 0.0–0.1)
Basophils Relative: 1 %
EOS ABS: 0.2 10*3/uL (ref 0.0–0.5)
EOS PCT: 4 %
HCT: 36.7 % (ref 36.0–46.0)
Hemoglobin: 11.8 g/dL — ABNORMAL LOW (ref 12.0–15.0)
Immature Granulocytes: 1 %
Lymphocytes Relative: 18 %
Lymphs Abs: 0.8 10*3/uL (ref 0.7–4.0)
MCH: 32.6 pg (ref 26.0–34.0)
MCHC: 32.2 g/dL (ref 30.0–36.0)
MCV: 101.4 fL — ABNORMAL HIGH (ref 80.0–100.0)
Monocytes Absolute: 0.3 10*3/uL (ref 0.1–1.0)
Monocytes Relative: 8 %
NRBC: 0 % (ref 0.0–0.2)
Neutro Abs: 3 10*3/uL (ref 1.7–7.7)
Neutrophils Relative %: 68 %
Platelets: 232 10*3/uL (ref 150–400)
RBC: 3.62 MIL/uL — AB (ref 3.87–5.11)
RDW: 13.2 % (ref 11.5–15.5)
WBC: 4.3 10*3/uL (ref 4.0–10.5)

## 2018-03-28 LAB — COMPREHENSIVE METABOLIC PANEL
ALK PHOS: 59 U/L (ref 38–126)
ALT: 12 U/L (ref 0–44)
AST: 24 U/L (ref 15–41)
Albumin: 3.4 g/dL — ABNORMAL LOW (ref 3.5–5.0)
Anion gap: 5 (ref 5–15)
BILIRUBIN TOTAL: 0.7 mg/dL (ref 0.3–1.2)
BUN: 7 mg/dL — ABNORMAL LOW (ref 8–23)
CALCIUM: 8.9 mg/dL (ref 8.9–10.3)
CO2: 27 mmol/L (ref 22–32)
CREATININE: 0.63 mg/dL (ref 0.44–1.00)
Chloride: 110 mmol/L (ref 98–111)
GFR calc Af Amer: >60 mL/min (ref >60)
Glucose, Bld: 100 mg/dL — ABNORMAL HIGH (ref 70–99)
Potassium: 4 mmol/L (ref 3.5–5.1)
Sodium: 142 mmol/L (ref 135–145)
TOTAL PROTEIN: 6.1 g/dL — AB (ref 6.5–8.1)

## 2018-03-28 MED ORDER — PALONOSETRON HCL INJECTION 0.25 MG/5ML
0.2500 mg | Freq: Once | INTRAVENOUS | Status: AC
  Administered 2018-03-28: 0.25 mg via INTRAVENOUS
  Filled 2018-03-28: qty 5

## 2018-03-28 MED ORDER — SODIUM CHLORIDE 0.9 % IV SOLN
600.0000 mg/m2 | Freq: Once | INTRAVENOUS | Status: AC
  Administered 2018-03-28: 1000 mg via INTRAVENOUS
  Filled 2018-03-28: qty 50

## 2018-03-28 MED ORDER — SODIUM CHLORIDE 0.9 % IV SOLN
Freq: Once | INTRAVENOUS | Status: AC
  Administered 2018-03-28: 09:00:00 via INTRAVENOUS
  Filled 2018-03-28: qty 250

## 2018-03-28 MED ORDER — PEGFILGRASTIM 6 MG/0.6ML ~~LOC~~ PSKT
6.0000 mg | PREFILLED_SYRINGE | Freq: Once | SUBCUTANEOUS | Status: AC
  Administered 2018-03-28: 6 mg via SUBCUTANEOUS
  Filled 2018-03-28: qty 0.6

## 2018-03-28 MED ORDER — SODIUM CHLORIDE 0.9 % IV SOLN
60.0000 mg/m2 | Freq: Once | INTRAVENOUS | Status: AC
  Administered 2018-03-28: 100 mg via INTRAVENOUS
  Filled 2018-03-28: qty 10

## 2018-03-28 MED ORDER — DEXAMETHASONE SODIUM PHOSPHATE 10 MG/ML IJ SOLN
10.0000 mg | Freq: Once | INTRAMUSCULAR | Status: AC
  Administered 2018-03-28: 10 mg via INTRAVENOUS
  Filled 2018-03-28: qty 1

## 2018-04-05 ENCOUNTER — Other Ambulatory Visit: Payer: Self-pay

## 2018-04-05 DIAGNOSIS — C320 Malignant neoplasm of glottis: Secondary | ICD-10-CM

## 2018-04-06 ENCOUNTER — Inpatient Hospital Stay (HOSPITAL_BASED_OUTPATIENT_CLINIC_OR_DEPARTMENT_OTHER): Payer: No Typology Code available for payment source | Admitting: Oncology

## 2018-04-06 ENCOUNTER — Inpatient Hospital Stay: Payer: No Typology Code available for payment source

## 2018-04-06 ENCOUNTER — Inpatient Hospital Stay: Payer: No Typology Code available for payment source | Attending: Oncology

## 2018-04-06 ENCOUNTER — Other Ambulatory Visit: Payer: Self-pay

## 2018-04-06 VITALS — BP 132/88 | HR 89 | Temp 98.4°F | Resp 18 | Wt 128.3 lb

## 2018-04-06 DIAGNOSIS — R5383 Other fatigue: Secondary | ICD-10-CM

## 2018-04-06 DIAGNOSIS — Z8521 Personal history of malignant neoplasm of larynx: Secondary | ICD-10-CM | POA: Insufficient documentation

## 2018-04-06 DIAGNOSIS — Z85828 Personal history of other malignant neoplasm of skin: Secondary | ICD-10-CM | POA: Diagnosis not present

## 2018-04-06 DIAGNOSIS — Z923 Personal history of irradiation: Secondary | ICD-10-CM

## 2018-04-06 DIAGNOSIS — R918 Other nonspecific abnormal finding of lung field: Secondary | ICD-10-CM

## 2018-04-06 DIAGNOSIS — K760 Fatty (change of) liver, not elsewhere classified: Secondary | ICD-10-CM | POA: Insufficient documentation

## 2018-04-06 DIAGNOSIS — Z87891 Personal history of nicotine dependence: Secondary | ICD-10-CM

## 2018-04-06 DIAGNOSIS — Z171 Estrogen receptor negative status [ER-]: Secondary | ICD-10-CM | POA: Insufficient documentation

## 2018-04-06 DIAGNOSIS — K219 Gastro-esophageal reflux disease without esophagitis: Secondary | ICD-10-CM

## 2018-04-06 DIAGNOSIS — C7951 Secondary malignant neoplasm of bone: Secondary | ICD-10-CM | POA: Diagnosis not present

## 2018-04-06 DIAGNOSIS — Z79899 Other long term (current) drug therapy: Secondary | ICD-10-CM | POA: Insufficient documentation

## 2018-04-06 DIAGNOSIS — I2510 Atherosclerotic heart disease of native coronary artery without angina pectoris: Secondary | ICD-10-CM

## 2018-04-06 DIAGNOSIS — Z853 Personal history of malignant neoplasm of breast: Secondary | ICD-10-CM | POA: Insufficient documentation

## 2018-04-06 DIAGNOSIS — R112 Nausea with vomiting, unspecified: Secondary | ICD-10-CM | POA: Insufficient documentation

## 2018-04-06 DIAGNOSIS — Z5111 Encounter for antineoplastic chemotherapy: Secondary | ICD-10-CM | POA: Insufficient documentation

## 2018-04-06 DIAGNOSIS — C7971 Secondary malignant neoplasm of right adrenal gland: Secondary | ICD-10-CM

## 2018-04-06 DIAGNOSIS — C50511 Malignant neoplasm of lower-outer quadrant of right female breast: Secondary | ICD-10-CM | POA: Insufficient documentation

## 2018-04-06 DIAGNOSIS — C50411 Malignant neoplasm of upper-outer quadrant of right female breast: Secondary | ICD-10-CM

## 2018-04-06 DIAGNOSIS — R5381 Other malaise: Secondary | ICD-10-CM

## 2018-04-06 DIAGNOSIS — M1990 Unspecified osteoarthritis, unspecified site: Secondary | ICD-10-CM | POA: Diagnosis not present

## 2018-04-06 DIAGNOSIS — E876 Hypokalemia: Secondary | ICD-10-CM | POA: Insufficient documentation

## 2018-04-06 DIAGNOSIS — C320 Malignant neoplasm of glottis: Secondary | ICD-10-CM

## 2018-04-06 LAB — COMPREHENSIVE METABOLIC PANEL
ALT: 13 U/L (ref 0–44)
AST: 24 U/L (ref 15–41)
Albumin: 4 g/dL (ref 3.5–5.0)
Alkaline Phosphatase: 117 U/L (ref 38–126)
Anion gap: 11 (ref 5–15)
BILIRUBIN TOTAL: 0.9 mg/dL (ref 0.3–1.2)
BUN: 9 mg/dL (ref 8–23)
CO2: 25 mmol/L (ref 22–32)
CREATININE: 0.8 mg/dL (ref 0.44–1.00)
Calcium: 8.8 mg/dL — ABNORMAL LOW (ref 8.9–10.3)
Chloride: 102 mmol/L (ref 98–111)
GFR calc Af Amer: >60 mL/min (ref >60)
GFR calc non Af Amer: >60 mL/min (ref >60)
Glucose, Bld: 135 mg/dL — ABNORMAL HIGH (ref 70–99)
Potassium: 3.3 mmol/L — ABNORMAL LOW (ref 3.5–5.1)
Sodium: 138 mmol/L (ref 135–145)
Total Protein: 6.5 g/dL (ref 6.5–8.1)

## 2018-04-06 LAB — CBC WITH DIFFERENTIAL/PLATELET
Abs Immature Granulocytes: 4.6 10*3/uL — ABNORMAL HIGH (ref 0.00–0.07)
Basophils Absolute: 0 10*3/uL (ref 0.0–0.1)
Basophils Relative: 0 %
Eosinophils Absolute: 0 10*3/uL (ref 0.0–0.5)
Eosinophils Relative: 0 %
HEMATOCRIT: 39.3 % (ref 36.0–46.0)
Hemoglobin: 13.1 g/dL (ref 12.0–15.0)
Immature Granulocytes: 16 %
Lymphocytes Relative: 4 %
Lymphs Abs: 1.3 10*3/uL (ref 0.7–4.0)
MCH: 32.7 pg (ref 26.0–34.0)
MCHC: 33.3 g/dL (ref 30.0–36.0)
MCV: 98 fL (ref 80.0–100.0)
MONO ABS: 1.7 10*3/uL — AB (ref 0.1–1.0)
Monocytes Relative: 6 %
Neutro Abs: 21.9 10*3/uL — ABNORMAL HIGH (ref 1.7–7.7)
Neutrophils Relative %: 74 %
Platelets: 190 10*3/uL (ref 150–400)
RBC: 4.01 MIL/uL (ref 3.87–5.11)
RDW: 12.5 % (ref 11.5–15.5)
WBC: 29.6 10*3/uL — ABNORMAL HIGH (ref 4.0–10.5)
nRBC: 0.3 % — ABNORMAL HIGH (ref 0.0–0.2)

## 2018-04-06 MED ORDER — HEPARIN SOD (PORK) LOCK FLUSH 100 UNIT/ML IV SOLN
500.0000 [IU] | Freq: Once | INTRAVENOUS | Status: AC
  Administered 2018-04-06: 500 [IU] via INTRAVENOUS
  Filled 2018-04-06: qty 5

## 2018-04-06 MED ORDER — SODIUM CHLORIDE 0.9% FLUSH
10.0000 mL | INTRAVENOUS | Status: DC | PRN
  Administered 2018-04-06: 10 mL via INTRAVENOUS
  Filled 2018-04-06: qty 10

## 2018-04-06 MED ORDER — SODIUM CHLORIDE 0.9 % IV SOLN
Freq: Once | INTRAVENOUS | Status: AC
  Administered 2018-04-06: 12:00:00 via INTRAVENOUS
  Filled 2018-04-06: qty 1000

## 2018-04-06 MED ORDER — DEXAMETHASONE SODIUM PHOSPHATE 10 MG/ML IJ SOLN
10.0000 mg | Freq: Once | INTRAMUSCULAR | Status: AC
  Administered 2018-04-06: 10 mg via INTRAVENOUS
  Filled 2018-04-06: qty 1

## 2018-04-06 MED ORDER — SODIUM CHLORIDE 0.9 % IV SOLN: 10.0000 mg | Freq: Once | INTRAVENOUS | Status: DC

## 2018-04-06 NOTE — Progress Notes (Signed)
Here for follow up. Overall feeling weak,no appetite ( taking 1/2 bottle ensure daily ) lower back pain continues .

## 2018-04-09 ENCOUNTER — Encounter: Payer: Self-pay | Admitting: Oncology

## 2018-04-09 NOTE — Progress Notes (Signed)
Hematology/Oncology Consult note [facility]  [phone] [phone]  Patient Care Team: Chrismon, Vickki Muff, PA as PCP - General (Physician Assistant)   Name of the patient: Denise Watts  [id number]  1956/03/15   Date of visit: 04/09/18  Diagnosis- 1.. H/o breast cancer in 1997 2. H/o stage I SCC of vocal cord in 2015. 3. Lung nodules4. Adrenal carcinoma of unknown primary s/p resection5. Stage IaT1a NxcM0 ER weakly positive 1-10%, PR negative and her 2 negative   Chief complaint/ Reason for visit- toxicity assessment after cycle 3 of TC chemotherapy  Heme/Onc history:  Oncology History   1. Carcinoma of breast, T1, N0, M0 tumor diagnosis.  In January of 1997 2. Carcinoma of the vocal cord T1, N0, M0 tumor.  Status postradiation therapy 3. Abnormal CT scan of the chest (December, 2015) 4. Repeat CT scan of chest shows stable nodule (July, 2016) 5. Neutropenia with normal hemoglobin and normal platelet count (July, 2016)     Cancer of vocal cord [facility])   11/10/2014 Initial Diagnosis    Cancer of vocal cord     Malignant neoplasm of lower-outer quadrant of right breast of female, estrogen receptor negative ([place])   11/24/2017 Initial Diagnosis    Malignant neoplasm of lower-outer quadrant of right breast of female, estrogen receptor negative ([place])    01/11/2018 -  Chemotherapy    The patient had palonosetron (ALOXI) injection 0.25 mg, 0.25 mg, Intravenous,  Once, 3 of 4 cycles Administration: 0.25 mg (01/30/2018), 0.25 mg (02/27/2018), 0.25 mg (03/28/2018) pegfilgrastim (NEULASTA ONPRO KIT) injection 6 mg, 6 mg, Subcutaneous, Once, 3 of 4 cycles Administration: 6 mg (01/30/2018), 6 mg (02/27/2018), 6 mg (03/28/2018) cyclophosphamide (CYTOXAN) 1,000 mg in sodium chloride 0.9 % 250 mL chemo infusion, 600 mg/m2 = 1,000 mg, Intravenous,  Once, 3 of 4 cycles Administration: 1,000 mg (01/30/2018), 1,000 mg (02/27/2018), 1,000 mg  (03/28/2018) DOCEtaxel (TAXOTERE) 130 mg in sodium chloride 0.9 % 250 mL chemo infusion, 75 mg/m2 = 130 mg, Intravenous,  Once, 3 of 4 cycles Dose modification: 60 mg/m2 (original dose 75 mg/m2, Cycle 2, Reason: Dose not tolerated) Administration: 130 mg (01/30/2018), 100 mg (02/27/2018), 100 mg (03/28/2018)  for chemotherapy treatment.     01/12/2018 Cancer Staging    Staging form: Breast, AJCC 8th Edition - Clinical stage from 01/12/2018: Stage IB (cT1a, cN0, cM0, G3, ER+, PR-, HER2-) - Signed by Sindy Guadeloupe, MD on 01/12/2018     The patient was originally diagnosed with right breast cancer in 1997,10 mm grade 3, ER pr positive her 2 negative, node negativestatus post surgery and radiation treatment only. She never received chemotherapy or hormone therapy. For her vocal cord cancer, she had extensive surgery and radiation therapy in 1998. Due to past history of smoking, she was also noted to have lung nodules. She is undergoing active surveillance imaging study of the chest.  Patient noted to have adrenal mass on CT chest which prompted PET CT in July 2018 which showed:IMPRESSION: 1. Right adrenal mass with peripheral hypermetabolism. Given interval development since 11/18/2015, suspicious for either isolated metastasis or a primary adrenal neoplasm. This should be considered for biopsy. 2. No evidence of hypermetabolic primary malignancy or extraadrenal metastasis. Pulmonary nodules are not significantly hypermetabolic. 3. Coronary artery atherosclerosis. Aortic Atherosclerosis (ICD10-I70.0). 4. Hepatic steatosis. 5. Left sixth rib hypermetabolism is favored to be related to remote Trauma.  Patient underwent right adrenalectomy which showed:DIAGNOSIS:  A. ADRENAL GLAND, RIGHT; ADRENALECTOMY:  - HIGH  GRADE CARCINOMA WITH EXTENSIVE NECROSIS.  - MARGINS OF THE SPECIMEN ARE NEGATIVE FOR CARCINOMA.   Comment:  Sections demonstrate abundant necrosis confined to the adrenal  medullary  compartment. In one block of tissue a small fragment of viable malignant  neoplasm was identified. A panel of immunohistochemical stains was  performed with the following pattern of immunoreactivity:  Super pancytokeratin: Positive  GATA-3: Negative  TTF-1: Negative  P40: Negative  Napsin: Negative  PAX-8: Negative  Chromogranin: Negative   Repeat CT chest abdomen and pelvis in July 2019 showed stable left lower lobe pulmonary nodule. No other evidence of metastatic disease. New 4.5 mm nodule in the right breast. This was followed by ultrasound mammogram and core biopsy of the breast lesion which was a grade 3 triple negative breast cancer. Patient was seen by Dr. Bary Castilla and underwent lumpectomy on 12/08/2017.  Patient had a 5 mm weakly ER positive tumor. Given her ongoing fatigue Oncotype testing was done to see if she could potentially avoid chemotherapy. Oncotype shows a recurrence score of 53 with her risk of distant recurrence at 9 years at greater than 39% with hormone therapyalone.Absolute benefit of chemotherapy was greater than 15% in her age group.   Interval history- she feels fatigued which is worse after chemo. Has some nausea/ vomiting for few days post chemo. Denies any difficulty swallowing or mouth sores  ECOG PS- 2 Pain scale- 0 Opioid associated constipation- no  Review of systems- Review of Systems  Constitutional: Positive for malaise/fatigue. Negative for chills, fever and weight loss.  HENT: Negative for congestion, ear discharge and nosebleeds.   Eyes: Negative for blurred vision.  Respiratory: Negative for cough, hemoptysis, sputum production, shortness of breath and wheezing.   Cardiovascular: Negative for chest pain, palpitations, orthopnea and claudication.  Gastrointestinal: Negative for abdominal pain, blood in stool, constipation, diarrhea, heartburn, melena, nausea and vomiting.  Genitourinary: Negative for dysuria, flank pain,  frequency, hematuria and urgency.  Musculoskeletal: Negative for back pain, joint pain and myalgias.  Skin: Negative for rash.  Neurological: Negative for dizziness, tingling, focal weakness, seizures, weakness and headaches.  Endo/Heme/Allergies: Does not bruise/bleed easily.  Psychiatric/Behavioral: Negative for depression and suicidal ideas. The patient does not have insomnia.        No Known Allergies   Past Medical History:  Diagnosis Date  . Arthritis    KNEE RIGHT  . Breast cancer [facility]) 1997   right breast lumpectomy with radiation  . Breast cancer ([place]) 11/15/2017   INVASIVE MAMMARY CARCINOMA/ triple negative  . 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[place]) 11/10/2014  . Cough 03/02/2017   INTERMTTENT DRY COUGH  . GERD (gastroesophageal reflux disease)   . History of hiatal hernia   . Personal history of radiation therapy   . Skin cancer   . Throat cancer [facility]) 1998   radiation     Past Surgical History:  Procedure Laterality Date  . BREAST BIOPSY Right 1997   positive  . BREAST BIOPSY Right 11/15/2017   INVASIVE MAMMARY CARCINOMA triple negative  . BREAST LUMPECTOMY Right 1997  . BREAST LUMPECTOMY WITH SENTINEL LYMPH NODE BIOPSY Right 12/08/2017   Procedure: BREAST LUMPECTOMY WITH SENTINEL LYMPH NODE BX;  Surgeon: Robert Bellow, MD;  Location: [facility] ORS;  Service: General;  Laterality: Right;  . COLONOSCOPY  2015  . ESOPHAGUS SURGERY    . KNEE SURGERY Right   . PORTACATH PLACEMENT Right 01/17/2018   Procedure: INSERTION PORT-A-CATH- RIGHT;  Surgeon: Robert Bellow, MD;  Location: [facility] ORS;  Service: General;  Laterality: Right;  . ROBOTIC ADRENALECTOMY Right 12/28/2016   Procedure: ROBOTIC ADRENALECTOMY;  Surgeon: Hollice Espy, MD;  Location: [facility] ORS;  Service: Urology;  Laterality: Right;  . SKIN CANCER EXCISION    . THROAT SURGERY  1998   throat cancer   . TUBAL LIGATION    . VENTRAL HERNIA REPAIR N/A 03/03/2017   Procedure: HERNIA REPAIR VENTRAL ADULT;  Surgeon:  Clayburn Pert, MD;  Location: [facility] ORS;  Service: General;  Laterality: N/A;    Social History   Socioeconomic History  . Marital status: Married    Spouse name: Not on file  . Number of children: Not on file  . Years of education: Not on file  . Highest education level: Not on file  Occupational History  . Not on file  Social Needs  . Financial resource strain: Not on file  . Food insecurity:    Worry: Not on file    Inability: Not on file  . Transportation needs:    Medical: Not on file    Non-medical: Not on file  Tobacco Use  . Smoking status: Former Smoker    Packs/day: 1.50    Years: 30.00    Pack years: 45.00    Types: Cigarettes    Last attempt to quit: 05/02/1996    Years since quitting: 21.9  . Smokeless tobacco: Never Used  Substance and Sexual Activity  . Alcohol use: Yes    Alcohol/week: 0.0 standard drinks    Comment: OCC  . Drug use: No  . Sexual activity: Not on file  Lifestyle  . Physical activity:    Days per week: Not on file    Minutes per session: Not on file  . Stress: Not on file  Relationships  . Social connections:    Talks on phone: Not on file    Gets together: Not on file    Attends religious service: Not on file    Active member of club or organization: Not on file    Attends meetings of clubs or organizations: Not on file    Relationship status: Not on file  . Intimate partner violence:    Fear of current or ex partner: Not on file    Emotionally abused: Not on file    Physically abused: Not on file    Forced sexual activity: Not on file  Other Topics Concern  . Not on file  Social History Narrative  . Not on file    Family History  Problem Relation Age of Onset  . Diabetes Mother   . Heart attack Mother   . Cancer Sister        cancer of eyes, spread to lung, other places. died at 11  . Cancer Sister 43       unknown primary, spread to bone, brain died in 18's  . Cancer Paternal Aunt        unk type  . Cancer  Paternal Grandmother        type unk  . Prostate cancer Neg Hx   . Kidney cancer Neg Hx   . Bladder Cancer Neg Hx      Current Outpatient Medications:  .  Cyanocobalamin (B-12 COMPLIANCE INJECTION IJ), Inject 1 Dose as directed every 30 (thirty) days., Disp: , Rfl:  .  lidocaine-prilocaine (EMLA) cream, Apply 1 application topically as needed., Disp: 30 g, Rfl: 0 .  morphine (ROXANOL) 20 MG/ML concentrated solution, Take 0.5 mLs (10 mg total) by mouth every  3 (three) hours as needed for severe pain., Disp: 240 mL, Rfl: 0 .  omeprazole (PRILOSEC) 40 MG capsule, TAKE 1 CAPSULE BY MOUTH EVERY DAY-AM, Disp: 30 capsule, Rfl: 11 .  ondansetron (ZOFRAN ODT) 8 MG disintegrating tablet, Take 1 tablet (8 mg total) by mouth every 8 (eight) hours as needed for nausea or vomiting., Disp: 20 tablet, Rfl: 0 .  ondansetron (ZOFRAN) 8 MG tablet, Take 1 tablet (8 mg total) by mouth 2 (two) times daily as needed for refractory nausea / vomiting. Start on day 3 after chemo., Disp: 30 tablet, Rfl: 1 .  clotrimazole (MYCELEX) 10 MG troche, Take 1 tablet (10 mg total) by mouth 5 (five) times daily. (Patient not taking: Reported on 03/06/2018), Disp: 50 tablet, Rfl: 0 .  dexamethasone (DECADRON) 4 MG tablet, Take 2 tablets (8 mg total) by mouth 2 (two) times daily. Start the day before Taxotere. Then again the day after chemo for 3 days. (Patient not taking: Reported on 02/13/2018), Disp: 30 tablet, Rfl: 1 .  gabapentin (NEURONTIN) 300 MG capsule, Take 1 capsule (300 mg total) by mouth 3 (three) times daily. (Patient not taking: Reported on 03/06/2018), Disp: 90 capsule, Rfl: 0 .  HYDROcodone-acetaminophen (NORCO/VICODIN) 5-325 MG tablet, Take 1 tablet by mouth every 4 (four) hours as needed for moderate pain. (Patient not taking: Reported on 03/06/2018), Disp: 20 tablet, Rfl: 0 .  hydrocortisone cream 0.5 %, Apply 1 application topically 2 (two) times daily. (Patient not taking: Reported on 04/06/2018), Disp: 30 g, Rfl:  0 .  LORazepam (ATIVAN) 0.5 MG tablet, Take 1 tablet (0.5 mg total) by mouth every 6 (six) hours as needed (Nausea or vomiting). (Patient not taking: Reported on 02/13/2018), Disp: 30 tablet, Rfl: 0 .  magic mouthwash w/lidocaine SOLN, Take 5 mLs by mouth 4 (four) times daily. (Patient not taking: Reported on 02/27/2018), Disp: 180 mL, Rfl: 0 .  naproxen sodium (ALEVE) 220 MG tablet, Take 220 mg by mouth daily as needed (pain). , Disp: , Rfl:  .  prochlorperazine (COMPAZINE) 10 MG tablet, Take 1 tablet (10 mg total) by mouth every 6 (six) hours as needed (Nausea or vomiting). (Patient not taking: Reported on 02/13/2018), Disp: 30 tablet, Rfl: 1 .  traMADol (ULTRAM) 50 MG tablet, Take 1 tablet (50 mg total) by mouth every 6 (six) hours as needed. (Patient not taking: Reported on 03/06/2018), Disp: 30 tablet, Rfl: 0  Physical exam:  Vitals:   04/06/18 1110  BP: 132/88  Pulse: 89  Resp: 18  Temp: 98.4 F (36.9 C)  TempSrc: Tympanic  Weight: 128 lb 4.8 oz (58.2 kg)   Physical Exam  Constitutional: She is oriented to person, place, and time.  Appears thin frail and fatigued. Appears older than stated age  HENT:  Head: Normocephalic and atraumatic.  Eyes: Pupils are equal, round, and reactive to light. EOM are normal.  Neck: Normal range of motion.  Cardiovascular: Normal rate, regular rhythm and normal heart sounds.  Pulmonary/Chest: Effort normal and breath sounds normal.  Abdominal: Soft. Bowel sounds are normal.  Neurological: She is alert and oriented to person, place, and time.  Skin: Skin is warm and dry.     CMP Latest Ref Rng & Units 04/06/2018  Glucose 70 - 99 mg/dL 135(H)  BUN 8 - 23 mg/dL 9  Creatinine 0.44 - 1.00 mg/dL 0.80  Sodium 135 - 145 mmol/L 138  Potassium 3.5 - 5.1 mmol/L 3.3(L)  Chloride 98 - 111 mmol/L 102  CO2 22 -  32 mmol/L 25  Calcium 8.9 - 10.3 mg/dL 8.8(L)  Total Protein 6.5 - 8.1 g/dL 6.5  Total Bilirubin 0.3 - 1.2 mg/dL 0.9  Alkaline Phos 38 - 126 U/L  117  AST 15 - 41 U/L 24  ALT 0 - 44 U/L 13   CBC Latest Ref Rng & Units 04/06/2018  WBC 4.0 - 10.5 K/uL 29.6(H)  Hemoglobin 12.0 - 15.0 g/dL 13.1  Hematocrit 36.0 - 46.0 % 39.3  Platelets 150 - 400 K/uL 190      Assessment and plan- Patient is a 62 y.o. 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 Final path showed 5 mm grade 3 Er weakly positive 1-10%, pr negative and her 2 negative tumor.Oncotype came back as high risk.  She is status post 3 cycles of adjuvant TC chemotherapy and here for toxicity check after 3 cycles.  Her white count is elevated at 29.6 likely secondary to Neulasta.  No infectious symptoms.  No thrush.  She does develop significant fatigue after chemotherapy and we discussed about delaying her fourth cycle by 1 week.  She will return to clinic on 04/26/2018 for her last cycle of adjuvant TC chemotherapy with on pro-Neulasta support and I will see her on that day with CBC and CMP.  I will give her 1 L of IV fluids today with 20 mEq of potassium for mild hypokalemia of 3.3 along with 10 mg of IV Decadron.   Visit Diagnosis 1. Other fatigue   2. Malignant neoplasm of lower-outer quadrant of right breast of female, estrogen receptor negative ([place])   3. Hypokalemia      Dr. Randa Evens, MD, MPH [facility] [phone] 04/09/2018 8:24 AM

## 2018-04-19 ENCOUNTER — Other Ambulatory Visit: Payer: Self-pay

## 2018-04-19 ENCOUNTER — Ambulatory Visit: Payer: Self-pay | Admitting: Oncology

## 2018-04-19 ENCOUNTER — Ambulatory Visit: Payer: Self-pay

## 2018-04-20 ENCOUNTER — Inpatient Hospital Stay: Payer: No Typology Code available for payment source

## 2018-04-26 ENCOUNTER — Other Ambulatory Visit: Payer: Self-pay

## 2018-04-26 ENCOUNTER — Encounter: Payer: Self-pay | Admitting: Oncology

## 2018-04-26 ENCOUNTER — Inpatient Hospital Stay (HOSPITAL_BASED_OUTPATIENT_CLINIC_OR_DEPARTMENT_OTHER): Payer: No Typology Code available for payment source | Admitting: Oncology

## 2018-04-26 ENCOUNTER — Inpatient Hospital Stay: Payer: No Typology Code available for payment source

## 2018-04-26 ENCOUNTER — Other Ambulatory Visit: Payer: Self-pay | Admitting: *Deleted

## 2018-04-26 VITALS — BP 129/83 | HR 74 | Temp 98.9°F | Wt 133.0 lb

## 2018-04-26 DIAGNOSIS — C7971 Secondary malignant neoplasm of right adrenal gland: Secondary | ICD-10-CM

## 2018-04-26 DIAGNOSIS — Z171 Estrogen receptor negative status [ER-]: Secondary | ICD-10-CM | POA: Diagnosis not present

## 2018-04-26 DIAGNOSIS — M1990 Unspecified osteoarthritis, unspecified site: Secondary | ICD-10-CM

## 2018-04-26 DIAGNOSIS — Z79899 Other long term (current) drug therapy: Secondary | ICD-10-CM

## 2018-04-26 DIAGNOSIS — Z853 Personal history of malignant neoplasm of breast: Secondary | ICD-10-CM

## 2018-04-26 DIAGNOSIS — C7951 Secondary malignant neoplasm of bone: Secondary | ICD-10-CM | POA: Diagnosis not present

## 2018-04-26 DIAGNOSIS — Z8521 Personal history of malignant neoplasm of larynx: Secondary | ICD-10-CM

## 2018-04-26 DIAGNOSIS — C50511 Malignant neoplasm of lower-outer quadrant of right female breast: Secondary | ICD-10-CM

## 2018-04-26 DIAGNOSIS — K760 Fatty (change of) liver, not elsewhere classified: Secondary | ICD-10-CM

## 2018-04-26 DIAGNOSIS — Z5111 Encounter for antineoplastic chemotherapy: Secondary | ICD-10-CM | POA: Diagnosis not present

## 2018-04-26 DIAGNOSIS — R112 Nausea with vomiting, unspecified: Secondary | ICD-10-CM

## 2018-04-26 DIAGNOSIS — Z923 Personal history of irradiation: Secondary | ICD-10-CM

## 2018-04-26 DIAGNOSIS — I2510 Atherosclerotic heart disease of native coronary artery without angina pectoris: Secondary | ICD-10-CM

## 2018-04-26 DIAGNOSIS — R5381 Other malaise: Secondary | ICD-10-CM

## 2018-04-26 DIAGNOSIS — Z87891 Personal history of nicotine dependence: Secondary | ICD-10-CM

## 2018-04-26 DIAGNOSIS — R918 Other nonspecific abnormal finding of lung field: Secondary | ICD-10-CM

## 2018-04-26 DIAGNOSIS — E876 Hypokalemia: Secondary | ICD-10-CM

## 2018-04-26 DIAGNOSIS — K219 Gastro-esophageal reflux disease without esophagitis: Secondary | ICD-10-CM

## 2018-04-26 DIAGNOSIS — Z85828 Personal history of other malignant neoplasm of skin: Secondary | ICD-10-CM

## 2018-04-26 DIAGNOSIS — R5383 Other fatigue: Secondary | ICD-10-CM

## 2018-04-26 DIAGNOSIS — C320 Malignant neoplasm of glottis: Secondary | ICD-10-CM

## 2018-04-26 LAB — COMPREHENSIVE METABOLIC PANEL
ALT: 12 U/L (ref 0–44)
ANION GAP: 7 (ref 5–15)
AST: 22 U/L (ref 15–41)
Albumin: 3.3 g/dL — ABNORMAL LOW (ref 3.5–5.0)
Alkaline Phosphatase: 60 U/L (ref 38–126)
BUN: 7 mg/dL — ABNORMAL LOW (ref 8–23)
CO2: 26 mmol/L (ref 22–32)
Calcium: 8.8 mg/dL — ABNORMAL LOW (ref 8.9–10.3)
Chloride: 108 mmol/L (ref 98–111)
Creatinine, Ser: 0.61 mg/dL (ref 0.44–1.00)
GFR calc Af Amer: >60 mL/min (ref >60)
GFR calc non Af Amer: >60 mL/min (ref >60)
Glucose, Bld: 95 mg/dL (ref 70–99)
Potassium: 3.9 mmol/L (ref 3.5–5.1)
Sodium: 141 mmol/L (ref 135–145)
Total Bilirubin: 0.7 mg/dL (ref 0.3–1.2)
Total Protein: 6.1 g/dL — ABNORMAL LOW (ref 6.5–8.1)

## 2018-04-26 LAB — CBC WITH DIFFERENTIAL/PLATELET
Abs Immature Granulocytes: 0.01 10*3/uL (ref 0.00–0.07)
Basophils Absolute: 0 10*3/uL (ref 0.0–0.1)
Basophils Relative: 1 %
Eosinophils Absolute: 0.5 10*3/uL (ref 0.0–0.5)
Eosinophils Relative: 12 %
HCT: 35.7 % — ABNORMAL LOW (ref 36.0–46.0)
Hemoglobin: 11.3 g/dL — ABNORMAL LOW (ref 12.0–15.0)
Immature Granulocytes: 0 %
Lymphocytes Relative: 14 %
Lymphs Abs: 0.6 10*3/uL — ABNORMAL LOW (ref 0.7–4.0)
MCH: 31.9 pg (ref 26.0–34.0)
MCHC: 31.7 g/dL (ref 30.0–36.0)
MCV: 100.8 fL — ABNORMAL HIGH (ref 80.0–100.0)
MONOS PCT: 10 %
Monocytes Absolute: 0.5 10*3/uL (ref 0.1–1.0)
NEUTROS PCT: 63 %
Neutro Abs: 2.9 10*3/uL (ref 1.7–7.7)
Platelets: 246 10*3/uL (ref 150–400)
RBC: 3.54 MIL/uL — ABNORMAL LOW (ref 3.87–5.11)
RDW: 12.9 % (ref 11.5–15.5)
WBC: 4.5 10*3/uL (ref 4.0–10.5)
nRBC: 0 % (ref 0.0–0.2)

## 2018-04-26 MED ORDER — HEPARIN SOD (PORK) LOCK FLUSH 100 UNIT/ML IV SOLN
500.0000 [IU] | Freq: Once | INTRAVENOUS | Status: AC | PRN
  Administered 2018-04-26: 500 [IU]
  Filled 2018-04-26: qty 5

## 2018-04-26 MED ORDER — DEXAMETHASONE SODIUM PHOSPHATE 10 MG/ML IJ SOLN
10.0000 mg | Freq: Once | INTRAMUSCULAR | Status: AC
  Administered 2018-04-26: 10 mg via INTRAVENOUS
  Filled 2018-04-26: qty 1

## 2018-04-26 MED ORDER — SODIUM CHLORIDE 0.9 % IV SOLN
Freq: Once | INTRAVENOUS | Status: AC
  Administered 2018-04-26: 10:00:00 via INTRAVENOUS
  Filled 2018-04-26: qty 250

## 2018-04-26 MED ORDER — SODIUM CHLORIDE 0.9 % IV SOLN
60.0000 mg/m2 | Freq: Once | INTRAVENOUS | Status: AC
  Administered 2018-04-26: 100 mg via INTRAVENOUS
  Filled 2018-04-26: qty 10

## 2018-04-26 MED ORDER — SODIUM CHLORIDE 0.9 % IV SOLN
600.0000 mg/m2 | Freq: Once | INTRAVENOUS | Status: AC
  Administered 2018-04-26: 1000 mg via INTRAVENOUS
  Filled 2018-04-26: qty 50

## 2018-04-26 MED ORDER — PEGFILGRASTIM 6 MG/0.6ML ~~LOC~~ PSKT
6.0000 mg | PREFILLED_SYRINGE | Freq: Once | SUBCUTANEOUS | Status: AC
  Administered 2018-04-26: 6 mg via SUBCUTANEOUS
  Filled 2018-04-26: qty 0.6

## 2018-04-26 MED ORDER — PALONOSETRON HCL INJECTION 0.25 MG/5ML
0.2500 mg | Freq: Once | INTRAVENOUS | Status: AC
  Administered 2018-04-26: 0.25 mg via INTRAVENOUS
  Filled 2018-04-26: qty 5

## 2018-04-26 MED ORDER — SODIUM CHLORIDE 0.9 % IV SOLN: 10.0000 mg | Freq: Once | INTRAVENOUS | Status: DC

## 2018-04-26 NOTE — Progress Notes (Signed)
Patient stated that she gets lower abdominal pain and cramping everyday but her morphine helps her. Patient denied nausea, vomiting, fever, chills, constipation, diarrhea.

## 2018-04-26 NOTE — Progress Notes (Signed)
Hematology/Oncology Consult note [facility]  [phone] [phone]  Patient Care Team: Chrismon, Vickki Muff, PA as PCP - General (Physician Assistant)   Name of the patient: Denise Watts  [id number]  [birth year]   Date of visit: 04/26/18  Diagnosis- 1.. H/o breast cancer in 1997 2. H/o stage I SCC of vocal cord in 2015. 3. Lung nodules4. Adrenal carcinoma of unknown primary s/p resection5. Stage IaT1a NxcM0 ER weakly positive 1-10%, PR negative and her 2 negative  Chief complaint/ Reason for visit-on treatment assessment prior to cycle 4 of adjuvant TC chemotherapy  Heme/Onc history:  Oncology History   1. Carcinoma of breast, T1, N0, M0 tumor diagnosis.  In January of 1997 2. Carcinoma of the vocal cord T1, N0, M0 tumor.  Status postradiation therapy 3. Abnormal CT scan of the chest (December, 2015) 4. Repeat CT scan of chest shows stable nodule (July, 2016) 5. Neutropenia with normal hemoglobin and normal platelet count (July, 2016)     Cancer of vocal cord [facility])   11/10/2014 Initial Diagnosis    Cancer of vocal cord     Malignant neoplasm of lower-outer quadrant of right breast of female, estrogen receptor negative ([place])   11/24/2017 Initial Diagnosis    Malignant neoplasm of lower-outer quadrant of right breast of female, estrogen receptor negative ([place])    01/12/2018 Cancer Staging    Staging form: Breast, AJCC 8th Edition - Clinical stage from 01/12/2018: Stage IB (cT1a, cN0, cM0, G3, ER+, PR-, HER2-) - Signed by Sindy Guadeloupe, MD on 01/12/2018    01/30/2018 -  Chemotherapy    The patient had palonosetron (ALOXI) injection 0.25 mg, 0.25 mg, Intravenous,  Once, 4 of 4 cycles Administration: 0.25 mg (01/30/2018), 0.25 mg (02/27/2018), 0.25 mg (03/28/2018) pegfilgrastim (NEULASTA ONPRO KIT) injection 6 mg, 6 mg, Subcutaneous, Once, 4 of 4 cycles Administration: 6 mg (01/30/2018), 6 mg (02/27/2018), 6 mg  (03/28/2018) cyclophosphamide (CYTOXAN) 1,000 mg in sodium chloride 0.9 % 250 mL chemo infusion, 600 mg/m2 = 1,000 mg, Intravenous,  Once, 4 of 4 cycles Administration: 1,000 mg (01/30/2018), 1,000 mg (02/27/2018), 1,000 mg (03/28/2018) DOCEtaxel (TAXOTERE) 130 mg in sodium chloride 0.9 % 250 mL chemo infusion, 75 mg/m2 = 130 mg, Intravenous,  Once, 4 of 4 cycles Dose modification: 60 mg/m2 (original dose 75 mg/m2, Cycle 2, Reason: Dose not tolerated) Administration: 130 mg (01/30/2018), 100 mg (02/27/2018), 100 mg (03/28/2018)  for chemotherapy treatment.     The patient was originally diagnosed with right breast cancer in 1997,10 mm grade 3, ER pr positive her 2 negative, node negativestatus post surgery and radiation treatment only. She never received chemotherapy or hormone therapy. For her vocal cord cancer, she had extensive surgery and radiation therapy in 1998. Due to past history of smoking, she was also noted to have lung nodules. She is undergoing active surveillance imaging study of the chest.  Patient noted to have adrenal mass on CT chest which prompted PET CT in July 2018 which showed:IMPRESSION: 1. Right adrenal mass with peripheral hypermetabolism. Given interval development since 11/18/2015, suspicious for either isolated metastasis or a primary adrenal neoplasm. This should be considered for biopsy. 2. No evidence of hypermetabolic primary malignancy or extraadrenal metastasis. Pulmonary nodules are not significantly hypermetabolic. 3. Coronary artery atherosclerosis. Aortic Atherosclerosis (ICD10-I70.0). 4. Hepatic steatosis. 5. Left sixth rib hypermetabolism is favored to be related to remote Trauma.  Patient underwent right adrenalectomy which showed:DIAGNOSIS:  A. ADRENAL GLAND, RIGHT; ADRENALECTOMY:  - HIGH  GRADE CARCINOMA WITH EXTENSIVE NECROSIS.  - MARGINS OF THE SPECIMEN ARE NEGATIVE FOR CARCINOMA.   Comment:  Sections demonstrate abundant necrosis  confined to the adrenal medullary  compartment. In one block of tissue a small fragment of viable malignant  neoplasm was identified. A panel of immunohistochemical stains was  performed with the following pattern of immunoreactivity:  Super pancytokeratin: Positive  GATA-3: Negative  TTF-1: Negative  P40: Negative  Napsin: Negative  PAX-8: Negative  Chromogranin: Negative   Repeat CT chest abdomen and pelvis in July 2019 showed stable left lower lobe pulmonary nodule. No other evidence of metastatic disease. New 4.5 mm nodule in the right breast. This was followed by ultrasound mammogram and core biopsy of the breast lesion which was a grade 3 triple negative breast cancer. Patient was seen by Dr. Bary Castilla and underwent lumpectomy on 12/08/2017.  Patient had a 5 mm weakly ER positive tumor. Given her ongoing fatigue Oncotype testing was done to see if she could potentially avoid chemotherapy. Oncotype shows a recurrence score of 53 with her risk of distant recurrence at 9 years at greater than 39% with hormone therapyalone.Absolute benefit of chemotherapy was greater than 15% in her age group.   Interval history- she has chronic fatigue which is unchanged. Appetite is fair and weight has remained stable   ECOG PS- 2 Pain scale- 0 Opioid associated constipation- no  Review of systems- Review of Systems  Constitutional: Positive for malaise/fatigue. Negative for chills, fever and weight loss.  HENT: Negative for congestion, ear discharge and nosebleeds.   Eyes: Negative for blurred vision.  Respiratory: Negative for cough, hemoptysis, sputum production, shortness of breath and wheezing.   Cardiovascular: Negative for chest pain, palpitations, orthopnea and claudication.  Gastrointestinal: Negative for abdominal pain, blood in stool, constipation, diarrhea, heartburn, melena, nausea and vomiting.  Genitourinary: Negative for dysuria, flank pain, frequency, hematuria and urgency.   Musculoskeletal: Negative for back pain, joint pain and myalgias.  Skin: Negative for rash.  Neurological: Negative for dizziness, tingling, focal weakness, seizures, weakness and headaches.  Endo/Heme/Allergies: Does not bruise/bleed easily.  Psychiatric/Behavioral: Negative for depression and suicidal ideas. The patient does not have insomnia.        No Known Allergies   Past Medical History:  Diagnosis Date  . Arthritis    KNEE RIGHT  . Breast cancer [facility]) 1997   right breast lumpectomy with radiation  . Breast cancer ([place]) 11/15/2017   INVASIVE MAMMARY CARCINOMA/ triple negative  . 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[place]) 11/10/2014  . Cough 03/02/2017   INTERMTTENT DRY COUGH  . GERD (gastroesophageal reflux disease)   . History of hiatal hernia   . Personal history of radiation therapy   . Skin cancer   . Throat cancer [facility]) 1998   radiation     Past Surgical History:  Procedure Laterality Date  . BREAST BIOPSY Right 1997   positive  . BREAST BIOPSY Right 11/15/2017   INVASIVE MAMMARY CARCINOMA triple negative  . BREAST LUMPECTOMY Right 1997  . BREAST LUMPECTOMY WITH SENTINEL LYMPH NODE BIOPSY Right 12/08/2017   Procedure: BREAST LUMPECTOMY WITH SENTINEL LYMPH NODE BX;  Surgeon: Robert Bellow, MD;  Location: [facility] ORS;  Service: General;  Laterality: Right;  . COLONOSCOPY  2015  . ESOPHAGUS SURGERY    . KNEE SURGERY Right   . PORTACATH PLACEMENT Right 01/17/2018   Procedure: INSERTION PORT-A-CATH- RIGHT;  Surgeon: Robert Bellow, MD;  Location: [facility] ORS;  Service: General;  Laterality: Right;  .  ROBOTIC ADRENALECTOMY Right 12/28/2016   Procedure: ROBOTIC ADRENALECTOMY;  Surgeon: Hollice Espy, MD;  Location: [facility] ORS;  Service: Urology;  Laterality: Right;  . SKIN CANCER EXCISION    . THROAT SURGERY  1998   throat cancer   . TUBAL LIGATION    . VENTRAL HERNIA REPAIR N/A 03/03/2017   Procedure: HERNIA REPAIR VENTRAL ADULT;  Surgeon: Clayburn Pert, MD;  Location:  [facility] ORS;  Service: General;  Laterality: N/A;    Social History   Socioeconomic History  . Marital status: Married    Spouse name: Not on file  . Number of children: Not on file  . Years of education: Not on file  . Highest education level: Not on file  Occupational History  . Not on file  Social Needs  . Financial resource strain: Not on file  . Food insecurity:    Worry: Not on file    Inability: Not on file  . Transportation needs:    Medical: Not on file    Non-medical: Not on file  Tobacco Use  . Smoking status: Former Smoker    Packs/day: 1.50    Years: 30.00    Pack years: 45.00    Types: Cigarettes    Last attempt to quit: 05/02/1996    Years since quitting: 21.9  . Smokeless tobacco: Never Used  Substance and Sexual Activity  . Alcohol use: Yes    Alcohol/week: 0.0 standard drinks    Comment: OCC  . Drug use: No  . Sexual activity: Not on file  Lifestyle  . Physical activity:    Days per week: Not on file    Minutes per session: Not on file  . Stress: Not on file  Relationships  . Social connections:    Talks on phone: Not on file    Gets together: Not on file    Attends religious service: Not on file    Active member of club or organization: Not on file    Attends meetings of clubs or organizations: Not on file    Relationship status: Not on file  . Intimate partner violence:    Fear of current or ex partner: Not on file    Emotionally abused: Not on file    Physically abused: Not on file    Forced sexual activity: Not on file  Other Topics Concern  . Not on file  Social History Narrative  . Not on file    Family History  Problem Relation Age of Onset  . Diabetes Mother   . Heart attack Mother   . Cancer Sister        cancer of eyes, spread to lung, other places. died at 46  . Cancer Sister 64       unknown primary, spread to bone, brain died in 27's  . Cancer Paternal Aunt        unk type  . Cancer Paternal Grandmother        type unk    . Prostate cancer Neg Hx   . Kidney cancer Neg Hx   . Bladder Cancer Neg Hx      Current Outpatient Medications:  .  clotrimazole (MYCELEX) 10 MG troche, Take 1 tablet (10 mg total) by mouth 5 (five) times daily., Disp: 50 tablet, Rfl: 0 .  Cyanocobalamin (B-12 COMPLIANCE INJECTION IJ), Inject 1 Dose as directed every 30 (thirty) days., Disp: , Rfl:  .  dexamethasone (DECADRON) 4 MG tablet, Take 2 tablets (8 mg total) by mouth  2 (two) times daily. Start the day before Taxotere. Then again the day after chemo for 3 days., Disp: 30 tablet, Rfl: 1 .  gabapentin (NEURONTIN) 300 MG capsule, Take 1 capsule (300 mg total) by mouth 3 (three) times daily., Disp: 90 capsule, Rfl: 0 .  HYDROcodone-acetaminophen (NORCO/VICODIN) 5-325 MG tablet, Take 1 tablet by mouth every 4 (four) hours as needed for moderate pain., Disp: 20 tablet, Rfl: 0 .  hydrocortisone cream 0.5 %, Apply 1 application topically 2 (two) times daily., Disp: 30 g, Rfl: 0 .  lidocaine-prilocaine (EMLA) cream, Apply 1 application topically as needed., Disp: 30 g, Rfl: 0 .  LORazepam (ATIVAN) 0.5 MG tablet, Take 1 tablet (0.5 mg total) by mouth every 6 (six) hours as needed (Nausea or vomiting)., Disp: 30 tablet, Rfl: 0 .  magic mouthwash w/lidocaine SOLN, Take 5 mLs by mouth 4 (four) times daily., Disp: 180 mL, Rfl: 0 .  morphine (ROXANOL) 20 MG/ML concentrated solution, Take 0.5 mLs (10 mg total) by mouth every 3 (three) hours as needed for severe pain., Disp: 240 mL, Rfl: 0 .  naproxen sodium (ALEVE) 220 MG tablet, Take 220 mg by mouth daily as needed (pain). , Disp: , Rfl:  .  omeprazole (PRILOSEC) 40 MG capsule, TAKE 1 CAPSULE BY MOUTH EVERY DAY-AM, Disp: 30 capsule, Rfl: 11 .  ondansetron (ZOFRAN ODT) 8 MG disintegrating tablet, Take 1 tablet (8 mg total) by mouth every 8 (eight) hours as needed for nausea or vomiting., Disp: 20 tablet, Rfl: 0 .  ondansetron (ZOFRAN) 8 MG tablet, Take 1 tablet (8 mg total) by mouth 2 (two) times  daily as needed for refractory nausea / vomiting. Start on day 3 after chemo., Disp: 30 tablet, Rfl: 1 .  prochlorperazine (COMPAZINE) 10 MG tablet, Take 1 tablet (10 mg total) by mouth every 6 (six) hours as needed (Nausea or vomiting)., Disp: 30 tablet, Rfl: 1 .  traMADol (ULTRAM) 50 MG tablet, Take 1 tablet (50 mg total) by mouth every 6 (six) hours as needed., Disp: 30 tablet, Rfl: 0 No current facility-administered medications for this visit.   Facility-Administered Medications Ordered in Other Visits:  .  cyclophosphamide (CYTOXAN) 1,000 mg in sodium chloride 0.9 % 250 mL chemo infusion, 600 mg/m2 (Treatment Plan Recorded), Intravenous, Once, Sindy Guadeloupe, MD, Last Rate: 600 mL/hr at 04/26/18 1258, 1,000 mg at 04/26/18 1258 .  heparin lock flush 100 unit/mL, 500 Units, Intracatheter, Once PRN, Sindy Guadeloupe, MD .  pegfilgrastim (NEULASTA ONPRO KIT) injection 6 mg, 6 mg, Subcutaneous, Once, Sindy Guadeloupe, MD  Physical exam:  Vitals:   04/26/18 0933  BP: 129/83  Pulse: 74  Temp: 98.9 F (37.2 C)  TempSrc: Tympanic  Weight: 133 lb (60.3 kg)   Physical Exam HENT:     Head: Normocephalic and atraumatic.     Mouth/Throat:     Mouth: Mucous membranes are moist.  Eyes:     Pupils: Pupils are equal, round, and reactive to light.  Neck:     Musculoskeletal: Normal range of motion.  Cardiovascular:     Rate and Rhythm: Normal rate and regular rhythm.     Heart sounds: Normal heart sounds.  Pulmonary:     Effort: Pulmonary effort is normal.     Breath sounds: Normal breath sounds.  Abdominal:     General: Bowel sounds are normal.     Palpations: Abdomen is soft.  Skin:    General: Skin is warm and dry.  Neurological:  Mental Status: She is alert and oriented to person, place, and time.      CMP Latest Ref Rng & Units 04/26/2018  Glucose 70 - 99 mg/dL 95  BUN 8 - 23 mg/dL 7(L)  Creatinine 0.44 - 1.00 mg/dL 0.61  Sodium 135 - 145 mmol/L 141  Potassium 3.5 - 5.1 mmol/L  3.9  Chloride 98 - 111 mmol/L 108  CO2 22 - 32 mmol/L 26  Calcium 8.9 - 10.3 mg/dL 8.8(L)  Total Protein 6.5 - 8.1 g/dL 6.1(L)  Total Bilirubin 0.3 - 1.2 mg/dL 0.7  Alkaline Phos 38 - 126 U/L 60  AST 15 - 41 U/L 22  ALT 0 - 44 U/L 12   CBC Latest Ref Rng & Units 04/26/2018  WBC 4.0 - 10.5 K/uL 4.5  Hemoglobin 12.0 - 15.0 g/dL 11.3(L)  Hematocrit 36.0 - 46.0 % 35.7(L)  Platelets 150 - 400 K/uL 246     Assessment and plan- Patient is a 62 y.o. 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 Final path showed 5 mm grade 3 Er weakly positive 1-10%, pr negative and her 2 negative tumor.Oncotype came back as high risk.  She is here for on treatment assessment prior to cycle 4 of adjuvant TC chemotherapy  With cycle 4 of adjuvant TC chemotherapy today with plan for Neulasta support.  We will schedule her for IV fluids tentatively in 1 week's time.  I would have ideally like to get a repeat CT chest abdomen and pelvis in the next 2 to 3 weeks given her prior history of multiple cancers including an adrenal tumor that was resected last year of unknown primary.  Patient will not have any active insurance for 1 month during the month of January.  She would therefore like to get her scans done after that.  Repeat CBC CMP CT chest abdomen and pelvis with contrast in February 1 week and I will see her following the scans  She will call us in the interim if she has any questions or concerns   Visit Diagnosis 1. Malignant neoplasm of lower-outer quadrant of right breast of female, estrogen receptor negative ([place])   2. Encounter for antineoplastic chemotherapy      Dr. Randa Evens, MD, MPH [facility] at [facility] [phone] 04/26/2018 1:04 PM

## 2018-04-26 NOTE — Progress Notes (Unsigned)
IMG 

## 2018-05-01 ENCOUNTER — Inpatient Hospital Stay: Payer: No Typology Code available for payment source

## 2018-05-01 VITALS — BP 100/67 | HR 74 | Temp 98.3°F | Resp 18

## 2018-05-01 DIAGNOSIS — Z171 Estrogen receptor negative status [ER-]: Principal | ICD-10-CM

## 2018-05-01 DIAGNOSIS — C50511 Malignant neoplasm of lower-outer quadrant of right female breast: Secondary | ICD-10-CM

## 2018-05-01 DIAGNOSIS — Z5111 Encounter for antineoplastic chemotherapy: Secondary | ICD-10-CM | POA: Diagnosis not present

## 2018-05-01 DIAGNOSIS — E860 Dehydration: Secondary | ICD-10-CM

## 2018-05-01 MED ORDER — HEPARIN SOD (PORK) LOCK FLUSH 100 UNIT/ML IV SOLN
500.0000 [IU] | Freq: Once | INTRAVENOUS | Status: AC
  Administered 2018-05-01: 500 [IU] via INTRAVENOUS
  Filled 2018-05-01: qty 5

## 2018-05-01 MED ORDER — SODIUM CHLORIDE 0.9 % IV SOLN
Freq: Once | INTRAVENOUS | Status: AC
  Administered 2018-05-01: 15:00:00 via INTRAVENOUS
  Filled 2018-05-01: qty 250

## 2018-05-01 MED ORDER — SODIUM CHLORIDE 0.9% FLUSH
10.0000 mL | Freq: Once | INTRAVENOUS | Status: AC
  Administered 2018-05-01: 10 mL via INTRAVENOUS
  Filled 2018-05-01: qty 10

## 2018-05-10 ENCOUNTER — Ambulatory Visit: Payer: PRIVATE HEALTH INSURANCE

## 2018-05-11 ENCOUNTER — Ambulatory Visit: Payer: PRIVATE HEALTH INSURANCE | Admitting: Oncology

## 2018-05-15 ENCOUNTER — Ambulatory Visit: Payer: No Typology Code available for payment source | Admitting: General Surgery

## 2018-06-04 ENCOUNTER — Telehealth: Payer: Self-pay | Admitting: *Deleted

## 2018-06-04 NOTE — Telephone Encounter (Signed)
Called patient to let her know that she had originally got scheduled for a CT with out contrast.  Would rather do a CT with contrast and was actually contacted by the CT scan about whether or not we would like it with contrast.  Chair is better when we do it with contrast to see everything that were looking from a cancer standpoint.  I did change the test to CT chest with contrast including abdomen and pelvis.  Patient would have the same appointment tomorrow for the same time.  Called patient and explained everything above and she is agreeable to go ahead and tomorrow getting here a little bit early and [facility]  And she was coming over anyway to get her port labs drawn and use her port for her CT scan tomorrow.

## 2018-06-05 ENCOUNTER — Inpatient Hospital Stay: Payer: PRIVATE HEALTH INSURANCE | Attending: Oncology

## 2018-06-05 ENCOUNTER — Ambulatory Visit
Admission: RE | Admit: 2018-06-05 | Discharge: 2018-06-05 | Disposition: A | Discharge status: Home / Self Care | Payer: PRIVATE HEALTH INSURANCE | Source: Ambulatory Visit | Attending: Oncology | Admitting: Oncology

## 2018-06-05 ENCOUNTER — Ambulatory Visit: Admission: RE | Admit: 2018-06-05 | Payer: PRIVATE HEALTH INSURANCE | Source: Ambulatory Visit

## 2018-06-05 DIAGNOSIS — Z9221 Personal history of antineoplastic chemotherapy: Secondary | ICD-10-CM | POA: Diagnosis not present

## 2018-06-05 DIAGNOSIS — K219 Gastro-esophageal reflux disease without esophagitis: Secondary | ICD-10-CM | POA: Diagnosis not present

## 2018-06-05 DIAGNOSIS — I2510 Atherosclerotic heart disease of native coronary artery without angina pectoris: Secondary | ICD-10-CM | POA: Insufficient documentation

## 2018-06-05 DIAGNOSIS — C50511 Malignant neoplasm of lower-outer quadrant of right female breast: Secondary | ICD-10-CM | POA: Insufficient documentation

## 2018-06-05 DIAGNOSIS — C7951 Secondary malignant neoplasm of bone: Secondary | ICD-10-CM | POA: Diagnosis not present

## 2018-06-05 DIAGNOSIS — Z923 Personal history of irradiation: Secondary | ICD-10-CM | POA: Diagnosis not present

## 2018-06-05 DIAGNOSIS — I700 Atherosclerosis of aorta: Secondary | ICD-10-CM | POA: Diagnosis not present

## 2018-06-05 DIAGNOSIS — R918 Other nonspecific abnormal finding of lung field: Secondary | ICD-10-CM | POA: Insufficient documentation

## 2018-06-05 DIAGNOSIS — Z171 Estrogen receptor negative status [ER-]: Secondary | ICD-10-CM | POA: Diagnosis present

## 2018-06-05 DIAGNOSIS — Z85828 Personal history of other malignant neoplasm of skin: Secondary | ICD-10-CM | POA: Diagnosis not present

## 2018-06-05 DIAGNOSIS — Z8521 Personal history of malignant neoplasm of larynx: Secondary | ICD-10-CM | POA: Insufficient documentation

## 2018-06-05 DIAGNOSIS — Z87891 Personal history of nicotine dependence: Secondary | ICD-10-CM | POA: Diagnosis not present

## 2018-06-05 DIAGNOSIS — C7971 Secondary malignant neoplasm of right adrenal gland: Secondary | ICD-10-CM | POA: Diagnosis not present

## 2018-06-05 DIAGNOSIS — Z79899 Other long term (current) drug therapy: Secondary | ICD-10-CM | POA: Diagnosis not present

## 2018-06-05 DIAGNOSIS — E538 Deficiency of other specified B group vitamins: Secondary | ICD-10-CM | POA: Insufficient documentation

## 2018-06-05 DIAGNOSIS — M1990 Unspecified osteoarthritis, unspecified site: Secondary | ICD-10-CM | POA: Diagnosis not present

## 2018-06-05 DIAGNOSIS — R5383 Other fatigue: Secondary | ICD-10-CM | POA: Insufficient documentation

## 2018-06-05 DIAGNOSIS — Z23 Encounter for immunization: Secondary | ICD-10-CM | POA: Diagnosis not present

## 2018-06-05 DIAGNOSIS — R5381 Other malaise: Secondary | ICD-10-CM | POA: Insufficient documentation

## 2018-06-05 LAB — CBC WITH DIFFERENTIAL/PLATELET
ABS IMMATURE GRANULOCYTES: 0.01 10*3/uL (ref 0.00–0.07)
Basophils Absolute: 0 10*3/uL (ref 0.0–0.1)
Basophils Relative: 1 %
Eosinophils Absolute: 0.1 10*3/uL (ref 0.0–0.5)
Eosinophils Relative: 2 %
HCT: 42.5 % (ref 36.0–46.0)
Hemoglobin: 13.1 g/dL (ref 12.0–15.0)
Immature Granulocytes: 0 %
LYMPHS ABS: 1.1 10*3/uL (ref 0.7–4.0)
Lymphocytes Relative: 19 %
MCH: 31 pg (ref 26.0–34.0)
MCHC: 30.8 g/dL (ref 30.0–36.0)
MCV: 100.7 fL — ABNORMAL HIGH (ref 80.0–100.0)
MONOS PCT: 9 %
Monocytes Absolute: 0.5 10*3/uL (ref 0.1–1.0)
Neutro Abs: 4.1 10*3/uL (ref 1.7–7.7)
Neutrophils Relative %: 69 %
Platelets: 183 10*3/uL (ref 150–400)
RBC: 4.22 MIL/uL (ref 3.87–5.11)
RDW: 12.6 % (ref 11.5–15.5)
WBC: 5.8 10*3/uL (ref 4.0–10.5)
nRBC: 0 % (ref 0.0–0.2)

## 2018-06-05 LAB — COMPREHENSIVE METABOLIC PANEL
ALT: 22 U/L (ref 0–44)
AST: 33 U/L (ref 15–41)
Albumin: 4 g/dL (ref 3.5–5.0)
Alkaline Phosphatase: 69 U/L (ref 38–126)
Anion gap: 7 (ref 5–15)
BILIRUBIN TOTAL: 0.4 mg/dL (ref 0.3–1.2)
BUN: 13 mg/dL (ref 8–23)
CO2: 27 mmol/L (ref 22–32)
CREATININE: 0.62 mg/dL (ref 0.44–1.00)
Calcium: 9 mg/dL (ref 8.9–10.3)
Chloride: 105 mmol/L (ref 98–111)
GFR calc Af Amer: >60 mL/min (ref >60)
GFR calc non Af Amer: >60 mL/min (ref >60)
Glucose, Bld: 84 mg/dL (ref 70–99)
Potassium: 3.7 mmol/L (ref 3.5–5.1)
Sodium: 139 mmol/L (ref 135–145)
TOTAL PROTEIN: 6.9 g/dL (ref 6.5–8.1)

## 2018-06-05 MED ORDER — IOPAMIDOL (ISOVUE-300) INJECTION 61%
100.0000 mL | Freq: Once | INTRAVENOUS | Status: AC | PRN
  Administered 2018-06-05: 100 mL via INTRAVENOUS

## 2018-06-08 ENCOUNTER — Inpatient Hospital Stay (HOSPITAL_BASED_OUTPATIENT_CLINIC_OR_DEPARTMENT_OTHER): Payer: PRIVATE HEALTH INSURANCE | Admitting: Oncology

## 2018-06-08 ENCOUNTER — Other Ambulatory Visit: Payer: Self-pay

## 2018-06-08 ENCOUNTER — Inpatient Hospital Stay: Payer: PRIVATE HEALTH INSURANCE

## 2018-06-08 VITALS — BP 127/85 | HR 74 | Temp 98.7°F | Resp 18 | Wt 128.3 lb

## 2018-06-08 DIAGNOSIS — C50511 Malignant neoplasm of lower-outer quadrant of right female breast: Secondary | ICD-10-CM | POA: Diagnosis not present

## 2018-06-08 DIAGNOSIS — Z23 Encounter for immunization: Secondary | ICD-10-CM

## 2018-06-08 DIAGNOSIS — Z87891 Personal history of nicotine dependence: Secondary | ICD-10-CM

## 2018-06-08 DIAGNOSIS — Z923 Personal history of irradiation: Secondary | ICD-10-CM

## 2018-06-08 DIAGNOSIS — R918 Other nonspecific abnormal finding of lung field: Secondary | ICD-10-CM

## 2018-06-08 DIAGNOSIS — Z8521 Personal history of malignant neoplasm of larynx: Secondary | ICD-10-CM

## 2018-06-08 DIAGNOSIS — E538 Deficiency of other specified B group vitamins: Secondary | ICD-10-CM

## 2018-06-08 DIAGNOSIS — R5381 Other malaise: Secondary | ICD-10-CM

## 2018-06-08 DIAGNOSIS — K219 Gastro-esophageal reflux disease without esophagitis: Secondary | ICD-10-CM

## 2018-06-08 DIAGNOSIS — C7951 Secondary malignant neoplasm of bone: Secondary | ICD-10-CM

## 2018-06-08 DIAGNOSIS — Z9221 Personal history of antineoplastic chemotherapy: Secondary | ICD-10-CM

## 2018-06-08 DIAGNOSIS — M1990 Unspecified osteoarthritis, unspecified site: Secondary | ICD-10-CM

## 2018-06-08 DIAGNOSIS — I700 Atherosclerosis of aorta: Secondary | ICD-10-CM

## 2018-06-08 DIAGNOSIS — I2510 Atherosclerotic heart disease of native coronary artery without angina pectoris: Secondary | ICD-10-CM

## 2018-06-08 DIAGNOSIS — C7971 Secondary malignant neoplasm of right adrenal gland: Secondary | ICD-10-CM

## 2018-06-08 DIAGNOSIS — Z85828 Personal history of other malignant neoplasm of skin: Secondary | ICD-10-CM

## 2018-06-08 DIAGNOSIS — Z79899 Other long term (current) drug therapy: Secondary | ICD-10-CM

## 2018-06-08 DIAGNOSIS — Z171 Estrogen receptor negative status [ER-]: Secondary | ICD-10-CM

## 2018-06-08 DIAGNOSIS — R5383 Other fatigue: Secondary | ICD-10-CM

## 2018-06-08 MED ORDER — INFLUENZA VAC SPLIT QUAD 0.5 ML IM SUSY
0.5000 mL | PREFILLED_SYRINGE | Freq: Once | INTRAMUSCULAR | Status: AC
  Administered 2018-06-08: 0.5 mL via INTRAMUSCULAR
  Filled 2018-06-08: qty 0.5

## 2018-06-08 MED ORDER — CYANOCOBALAMIN 1000 MCG/ML IJ SOLN
1000.0000 ug | Freq: Once | INTRAMUSCULAR | Status: AC
  Administered 2018-06-08: 1000 ug via INTRAMUSCULAR
  Filled 2018-06-08: qty 1

## 2018-06-08 NOTE — Progress Notes (Signed)
Here for follow up. Per pt " I'm doing a lot better " energy still but improving she stated.

## 2018-06-11 ENCOUNTER — Encounter: Payer: Self-pay | Admitting: Oncology

## 2018-06-11 NOTE — Progress Notes (Signed)
Patient: Denise Watts Female    DOB: 1956-01-12   63 y.o.   MRN: [id number] Visit Date: 06/12/2018  Today's Provider: Vernie Murders, PA   Chief Complaint  Patient presents with  . Sore   Subjective:   HPI   Patient has a sore on the back of her left hand. Patient states sore has been there for 2 months. Sore is has been red and painful for a few days now. Patient has been been using neosporin with mld relief.    Past Medical History:  Diagnosis Date  . Arthritis    KNEE RIGHT  . Breast cancer [facility]) 1997   right breast lumpectomy with radiation  . Breast cancer ([place]) 11/15/2017   INVASIVE MAMMARY CARCINOMA/ triple negative  . 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[place]) 11/10/2014  . Cough 03/02/2017   INTERMTTENT DRY COUGH  . GERD (gastroesophageal reflux disease)   . History of hiatal hernia   . Personal history of radiation therapy   . Skin cancer   . Throat cancer [facility]) 1998   radiation   Past Surgical History:  Procedure Laterality Date  . BREAST BIOPSY Right 1997   positive  . BREAST BIOPSY Right 11/15/2017   INVASIVE MAMMARY CARCINOMA triple negative  . BREAST LUMPECTOMY Right 1997  . BREAST LUMPECTOMY WITH SENTINEL LYMPH NODE BIOPSY Right 12/08/2017   Procedure: BREAST LUMPECTOMY WITH SENTINEL LYMPH NODE BX;  Surgeon: Robert Bellow, MD;  Location: [facility] ORS;  Service: General;  Laterality: Right;  . COLONOSCOPY  2015  . ESOPHAGUS SURGERY    . KNEE SURGERY Right   . PORTACATH PLACEMENT Right 01/17/2018   Procedure: INSERTION PORT-A-CATH- RIGHT;  Surgeon: Robert Bellow, MD;  Location: [facility] ORS;  Service: General;  Laterality: Right;  . ROBOTIC ADRENALECTOMY Right 12/28/2016   Procedure: ROBOTIC ADRENALECTOMY;  Surgeon: Hollice Espy, MD;  Location: [facility] ORS;  Service: Urology;  Laterality: Right;  . SKIN CANCER EXCISION    . THROAT SURGERY  1998   throat cancer   . TUBAL LIGATION    . VENTRAL HERNIA REPAIR N/A 03/03/2017   Procedure: HERNIA REPAIR VENTRAL  ADULT;  Surgeon: Clayburn Pert, MD;  Location: [facility] ORS;  Service: General;  Laterality: N/A;   Family History  Problem Relation Age of Onset  . Diabetes Mother   . Heart attack Mother   . Cancer Sister        cancer of eyes, spread to lung, other places. died at 40  . Cancer Sister 4       unknown primary, spread to bone, brain died in 74's  . Cancer Paternal Aunt        unk type  . Cancer Paternal Grandmother        type unk  . Prostate cancer Neg Hx   . Kidney cancer Neg Hx   . Bladder Cancer Neg Hx    No Known Allergies  Current Outpatient Medications:  .  clotrimazole (MYCELEX) 10 MG troche, Take 1 tablet (10 mg total) by mouth 5 (five) times daily., Disp: 50 tablet, Rfl: 0 .  Cyanocobalamin (B-12 COMPLIANCE INJECTION IJ), Inject 1 Dose as directed every 30 (thirty) days., Disp: , Rfl:  .  dexamethasone (DECADRON) 4 MG tablet, Take 2 tablets (8 mg total) by mouth 2 (two) times daily. Start the day before Taxotere. Then again the day after chemo for 3 days., Disp: 30 tablet, Rfl: 1 .  gabapentin (NEURONTIN) 300 MG capsule, Take 1  capsule (300 mg total) by mouth 3 (three) times daily., Disp: 90 capsule, Rfl: 0 .  HYDROcodone-acetaminophen (NORCO/VICODIN) 5-325 MG tablet, Take 1 tablet by mouth every 4 (four) hours as needed for moderate pain., Disp: 20 tablet, Rfl: 0 .  hydrocortisone cream 0.5 %, Apply 1 application topically 2 (two) times daily. (Patient taking differently: Apply 1 application topically as needed. ), Disp: 30 g, Rfl: 0 .  lidocaine-prilocaine (EMLA) cream, Apply 1 application topically as needed., Disp: 30 g, Rfl: 0 .  LORazepam (ATIVAN) 0.5 MG tablet, Take 1 tablet (0.5 mg total) by mouth every 6 (six) hours as needed (Nausea or vomiting)., Disp: 30 tablet, Rfl: 0 .  magic mouthwash w/lidocaine SOLN, Take 5 mLs by mouth 4 (four) times daily., Disp: 180 mL, Rfl: 0 .  morphine (ROXANOL) 20 MG/ML concentrated solution, Take 0.5 mLs (10 mg total) by mouth every 3  (three) hours as needed for severe pain., Disp: 240 mL, Rfl: 0 .  naproxen sodium (ALEVE) 220 MG tablet, Take 220 mg by mouth daily as needed (pain). , Disp: , Rfl:  .  omeprazole (PRILOSEC) 40 MG capsule, TAKE 1 CAPSULE BY MOUTH EVERY DAY-AM, Disp: 30 capsule, Rfl: 11 .  ondansetron (ZOFRAN ODT) 8 MG disintegrating tablet, Take 1 tablet (8 mg total) by mouth every 8 (eight) hours as needed for nausea or vomiting., Disp: 20 tablet, Rfl: 0 .  ondansetron (ZOFRAN) 8 MG tablet, Take 1 tablet (8 mg total) by mouth 2 (two) times daily as needed for refractory nausea / vomiting. Start on day 3 after chemo., Disp: 30 tablet, Rfl: 1 .  prochlorperazine (COMPAZINE) 10 MG tablet, Take 1 tablet (10 mg total) by mouth every 6 (six) hours as needed (Nausea or vomiting)., Disp: 30 tablet, Rfl: 1 .  traMADol (ULTRAM) 50 MG tablet, Take 1 tablet (50 mg total) by mouth every 6 (six) hours as needed., Disp: 30 tablet, Rfl: 0  Review of Systems  Constitutional: Negative for appetite change, chills, fatigue and fever.  Respiratory: Negative for chest tightness and shortness of breath.   Cardiovascular: Negative for chest pain and palpitations.  Gastrointestinal: Negative for abdominal pain, nausea and vomiting.  Neurological: Negative for dizziness and weakness.   Social History   Tobacco Use  . Smoking status: Former Smoker    Packs/day: 1.50    Years: 30.00    Pack years: 45.00    Types: Cigarettes    Last attempt to quit: 05/02/1996    Years since quitting: 22.1  . Smokeless tobacco: Never Used  Substance Use Topics  . Alcohol use: Yes    Alcohol/week: 0.0 standard drinks    Comment: OCC     Objective:   BP 128/71 (BP Location: Left Arm, Patient Position: Sitting, Cuff Size: Normal)   Pulse 71   Temp 97.6 F (36.4 C) (Oral)   Resp 16   Wt 130 lb (59 kg)   SpO2 95%   BMI 24.56 kg/m  Vitals:   06/12/18 1601  BP: 128/71  Pulse: 71  Resp: 16  Temp: 97.6 F (36.4 C)  TempSrc: Oral  SpO2:  95%  Weight: 130 lb (59 kg)   Physical Exam Constitutional:      General: She is not in acute distress.    Appearance: She is well-developed.  HENT:     Head: Normocephalic and atraumatic.     Right Ear: Hearing normal.     Left Ear: Hearing normal.     Nose: Nose normal.  Eyes:     General: Lids are normal. No scleral icterus.       Right eye: No discharge.        Left eye: No discharge.     Conjunctiva/sclera: Conjunctivae normal.  Cardiovascular:     Rate and Rhythm: Regular rhythm.     Heart sounds: Normal heart sounds.  Pulmonary:     Effort: Pulmonary effort is normal. No respiratory distress.  Abdominal:     General: Bowel sounds are normal.     Palpations: Abdomen is soft.  Musculoskeletal: Normal range of motion.  Skin:    Findings: No lesion or rash.     Comments: 1 x 1 cm raised keratotic lesion on the dorsum of the left hand between the 2nd and 3rd MCP joints. Some surrounding erythema and tender to touch.  Neurological:     Mental Status: She is alert and oriented to person, place, and time.  Psychiatric:        Speech: Speech normal.        Behavior: Behavior normal.        Thought Content: Thought content normal.       Assessment & Plan    1. 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 Has become more tender with surrounding redness. Suspect infected/inflammed SCCIS on the dorsum of the left hand. Recommend referral to dermatologist for biopsy/excision (has a close relationship with Dr. Beather Arbour).  2. Infected skin lesion Patient very concerned about possible infection of this SCCIS on the left hand. Will give Doxycycline and encouraged to have dermatologist biopsy lesion soon. - doxycycline (VIBRA-TABS) 100 MG tablet; Take 1 tablet (100 mg total) by mouth 2 (two) times daily.  Dispense: 14 tablet; Refill: [place], PA  [facility]

## 2018-06-11 NOTE — Progress Notes (Addendum)
Hematology/Oncology Consult note [facility]  [phone] [phone]  Patient Care Team: Chrismon, Vickki Muff, PA as PCP - General (Physician Assistant)   Name of the patient: Denise Watts  [id number]  11-06-1955   Date of visit: 06/11/18  Diagnosis- 1. H/o breast cancer in 1997 2. H/o stage I SCC of vocal cord in 2015. 3. Lung nodules4. Adrenal carcinoma of unknown primary s/p resection5. Stage IaT1a NxcM0 ER weakly positive 1-10%, PR negative and her 2 negative   Chief complaint/ Reason for visit-discuss CT scan results and further management  Heme/Onc history:  Oncology History   1. Carcinoma of breast, T1, N0, M0 tumor diagnosis.  In January of 1997 2. Carcinoma of the vocal cord T1, N0, M0 tumor.  Status postradiation therapy 3. Abnormal CT scan of the chest (December, 2015) 4. Repeat CT scan of chest shows stable nodule (July, 2016) 5. Neutropenia with normal hemoglobin and normal platelet count (July, 2016)     Cancer of vocal cord [facility])   11/10/2014 Initial Diagnosis    Cancer of vocal cord     Malignant neoplasm of lower-outer quadrant of right breast of female, estrogen receptor negative ([place])   11/24/2017 Initial Diagnosis    Malignant neoplasm of lower-outer quadrant of right breast of female, estrogen receptor negative ([place])    01/12/2018 Cancer Staging    Staging form: Breast, AJCC 8th Edition - Clinical stage from 01/12/2018: Stage IB (cT1a, cN0, cM0, G3, ER+, PR-, HER2-) - Signed by Sindy Guadeloupe, MD on 01/12/2018    01/30/2018 -  Chemotherapy    The patient had palonosetron (ALOXI) injection 0.25 mg, 0.25 mg, Intravenous,  Once, 4 of 4 cycles Administration: 0.25 mg (01/30/2018), 0.25 mg (02/27/2018), 0.25 mg (03/28/2018), 0.25 mg (04/26/2018) pegfilgrastim (NEULASTA ONPRO KIT) injection 6 mg, 6 mg, Subcutaneous, Once, 4 of 4 cycles Administration: 6 mg (01/30/2018), 6 mg (02/27/2018), 6 mg (03/28/2018), 6 mg  (04/26/2018) cyclophosphamide (CYTOXAN) 1,000 mg in sodium chloride 0.9 % 250 mL chemo infusion, 600 mg/m2 = 1,000 mg, Intravenous,  Once, 4 of 4 cycles Administration: 1,000 mg (01/30/2018), 1,000 mg (02/27/2018), 1,000 mg (03/28/2018), 1,000 mg (04/26/2018) DOCEtaxel (TAXOTERE) 130 mg in sodium chloride 0.9 % 250 mL chemo infusion, 75 mg/m2 = 130 mg, Intravenous,  Once, 4 of 4 cycles Dose modification: 60 mg/m2 (original dose 75 mg/m2, Cycle 2, Reason: Dose not tolerated) Administration: 130 mg (01/30/2018), 100 mg (02/27/2018), 100 mg (03/28/2018), 100 mg (04/26/2018)  for chemotherapy treatment.     The patient was originally diagnosed with right breast cancer in 1997,10 mm grade 3, ER pr positive her 2 negative, node negativestatus post surgery and radiation treatment only. She never received chemotherapy or hormone therapy. For her vocal cord cancer, she had extensive surgery and radiation therapy in 1998. Due to past history of smoking, she was also noted to have lung nodules. She is undergoing active surveillance imaging study of the chest.  Patient noted to have adrenal mass on CT chest which prompted PET CT in July 2018 which showed:IMPRESSION: 1. Right adrenal mass with peripheral hypermetabolism. Given interval development since 11/18/2015, suspicious for either isolated metastasis or a primary adrenal neoplasm. This should be considered for biopsy. 2. No evidence of hypermetabolic primary malignancy or extraadrenal metastasis. Pulmonary nodules are not significantly hypermetabolic. 3. Coronary artery atherosclerosis. Aortic Atherosclerosis (ICD10-I70.0). 4. Hepatic steatosis. 5. Left sixth rib hypermetabolism is favored to be related to remote Trauma.  Patient underwent right adrenalectomy which showed:DIAGNOSIS:  A. ADRENAL GLAND, RIGHT; ADRENALECTOMY:  - HIGH GRADE CARCINOMA WITH EXTENSIVE NECROSIS.  - MARGINS OF THE SPECIMEN ARE NEGATIVE FOR CARCINOMA.   Comment:    Sections demonstrate abundant necrosis confined to the adrenal medullary  compartment. In one block of tissue a small fragment of viable malignant  neoplasm was identified. A panel of immunohistochemical stains was  performed with the following pattern of immunoreactivity:  Super pancytokeratin: Positive  GATA-3: Negative  TTF-1: Negative  P40: Negative  Napsin: Negative  PAX-8: Negative  Chromogranin: Negative   Repeat CT chest abdomen and pelvis in July 2019 showed stable left lower lobe pulmonary nodule. No other evidence of metastatic disease. New 4.5 mm nodule in the right breast. This was followed by ultrasound mammogram and core biopsy of the breast lesion which was a grade 3 triple negative breast cancer. Patient was seen by Dr. Bary Castilla and underwent lumpectomy on 12/08/2017.  Patient had a 5 mm weakly ER positive tumor. Given her ongoing fatigue Oncotype testing was done to see if she could potentially avoid chemotherapy. Oncotype shows a recurrence score of 53 with her risk of distant recurrence at 9 years at greater than 39% with hormone therapyalone.Absolute benefit of chemotherapy was greater than 15% in her age group.  Patient completed 4 cycles of adjuvant TC chemotherapy in December 2019  Interval history-patient continues to feel fatigued.  Appetite is fair and she is trying to eat and put on some weight.  Denies any pain or shortness of breath reports occasional soreness at the site of lumpectomy  ECOG PS- 2 Pain scale- 0 Opioid associated constipation- no  Review of systems- Review of Systems  Constitutional: Positive for malaise/fatigue and weight loss. Negative for chills and fever.  HENT: Negative for congestion, ear discharge and nosebleeds.   Eyes: Negative for blurred vision.  Respiratory: Negative for cough, hemoptysis, sputum production, shortness of breath and wheezing.   Cardiovascular: Negative for chest pain, palpitations, orthopnea and  claudication.  Gastrointestinal: Negative for abdominal pain, blood in stool, constipation, diarrhea, heartburn, melena, nausea and vomiting.  Genitourinary: Negative for dysuria, flank pain, frequency, hematuria and urgency.  Musculoskeletal: Negative for back pain, joint pain and myalgias.  Skin: Negative for rash.  Neurological: Negative for dizziness, tingling, focal weakness, seizures, weakness and headaches.  Endo/Heme/Allergies: Does not bruise/bleed easily.  Psychiatric/Behavioral: Negative for depression and suicidal ideas. The patient does not have insomnia.       No Known Allergies   Past Medical History:  Diagnosis Date  . Arthritis    KNEE RIGHT  . Breast cancer [facility]) 1997   right breast lumpectomy with radiation  . Breast cancer ([place]) 11/15/2017   INVASIVE MAMMARY CARCINOMA/ triple negative  . 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[place]) 11/10/2014  . Cough 03/02/2017   INTERMTTENT DRY COUGH  . GERD (gastroesophageal reflux disease)   . History of hiatal hernia   . Personal history of radiation therapy   . Skin cancer   . Throat cancer [facility]) 1998   radiation     Past Surgical History:  Procedure Laterality Date  . BREAST BIOPSY Right 1997   positive  . BREAST BIOPSY Right 11/15/2017   INVASIVE MAMMARY CARCINOMA triple negative  . BREAST LUMPECTOMY Right 1997  . BREAST LUMPECTOMY WITH SENTINEL LYMPH NODE BIOPSY Right 12/08/2017   Procedure: BREAST LUMPECTOMY WITH SENTINEL LYMPH NODE BX;  Surgeon: Robert Bellow, MD;  Location: [facility] ORS;  Service: General;  Laterality: Right;  . COLONOSCOPY  2015  . ESOPHAGUS  SURGERY    . KNEE SURGERY Right   . PORTACATH PLACEMENT Right 01/17/2018   Procedure: INSERTION PORT-A-CATH- RIGHT;  Surgeon: Robert Bellow, MD;  Location: [facility] ORS;  Service: General;  Laterality: Right;  . ROBOTIC ADRENALECTOMY Right 12/28/2016   Procedure: ROBOTIC ADRENALECTOMY;  Surgeon: Hollice Espy, MD;  Location: [facility] ORS;  Service: Urology;  Laterality:  Right;  . SKIN CANCER EXCISION    . THROAT SURGERY  1998   throat cancer   . TUBAL LIGATION    . VENTRAL HERNIA REPAIR N/A 03/03/2017   Procedure: HERNIA REPAIR VENTRAL ADULT;  Surgeon: Clayburn Pert, MD;  Location: [facility] ORS;  Service: General;  Laterality: N/A;    Social History   Socioeconomic History  . Marital status: Married    Spouse name: Not on file  . Number of children: Not on file  . Years of education: Not on file  . Highest education level: Not on file  Occupational History  . Not on file  Social Needs  . Financial resource strain: Not on file  . Food insecurity:    Worry: Not on file    Inability: Not on file  . Transportation needs:    Medical: Not on file    Non-medical: Not on file  Tobacco Use  . Smoking status: Former Smoker    Packs/day: 1.50    Years: 30.00    Pack years: 45.00    Types: Cigarettes    Last attempt to quit: 05/02/1996    Years since quitting: 22.1  . Smokeless tobacco: Never Used  Substance and Sexual Activity  . Alcohol use: Yes    Alcohol/week: 0.0 standard drinks    Comment: OCC  . Drug use: No  . Sexual activity: Not on file  Lifestyle  . Physical activity:    Days per week: Not on file    Minutes per session: Not on file  . Stress: Not on file  Relationships  . Social connections:    Talks on phone: Not on file    Gets together: Not on file    Attends religious service: Not on file    Active member of club or organization: Not on file    Attends meetings of clubs or organizations: Not on file    Relationship status: Not on file  . Intimate partner violence:    Fear of current or ex partner: Not on file    Emotionally abused: Not on file    Physically abused: Not on file    Forced sexual activity: Not on file  Other Topics Concern  . Not on file  Social History Narrative  . Not on file    Family History  Problem Relation Age of Onset  . Diabetes Mother   . Heart attack Mother   . Cancer Sister         cancer of eyes, spread to lung, other places. died at 22  . Cancer Sister 24       unknown primary, spread to bone, brain died in 45's  . Cancer Paternal Aunt        unk type  . Cancer Paternal Grandmother        type unk  . Prostate cancer Neg Hx   . Kidney cancer Neg Hx   . Bladder Cancer Neg Hx      Current Outpatient Medications:  .  Cyanocobalamin (B-12 COMPLIANCE INJECTION IJ), Inject 1 Dose as directed every 30 (thirty) days., Disp: , Rfl:  .  magic mouthwash w/lidocaine SOLN, Take 5 mLs by mouth 4 (four) times daily., Disp: 180 mL, Rfl: 0 .  morphine (ROXANOL) 20 MG/ML concentrated solution, Take 0.5 mLs (10 mg total) by mouth every 3 (three) hours as needed for severe pain., Disp: 240 mL, Rfl: 0 .  naproxen sodium (ALEVE) 220 MG tablet, Take 220 mg by mouth daily as needed (pain). , Disp: , Rfl:  .  omeprazole (PRILOSEC) 40 MG capsule, TAKE 1 CAPSULE BY MOUTH EVERY DAY-AM, Disp: 30 capsule, Rfl: 11 .  clotrimazole (MYCELEX) 10 MG troche, Take 1 tablet (10 mg total) by mouth 5 (five) times daily. (Patient not taking: Reported on 06/08/2018), Disp: 50 tablet, Rfl: 0 .  dexamethasone (DECADRON) 4 MG tablet, Take 2 tablets (8 mg total) by mouth 2 (two) times daily. Start the day before Taxotere. Then again the day after chemo for 3 days. (Patient not taking: Reported on 06/08/2018), Disp: 30 tablet, Rfl: 1 .  gabapentin (NEURONTIN) 300 MG capsule, Take 1 capsule (300 mg total) by mouth 3 (three) times daily. (Patient not taking: Reported on 06/08/2018), Disp: 90 capsule, Rfl: 0 .  HYDROcodone-acetaminophen (NORCO/VICODIN) 5-325 MG tablet, Take 1 tablet by mouth every 4 (four) hours as needed for moderate pain. (Patient not taking: Reported on 06/08/2018), Disp: 20 tablet, Rfl: 0 .  hydrocortisone cream 0.5 %, Apply 1 application topically 2 (two) times daily. (Patient not taking: Reported on 06/08/2018), Disp: 30 g, Rfl: 0 .  lidocaine-prilocaine (EMLA) cream, Apply 1 application topically as  needed. (Patient not taking: Reported on 06/08/2018), Disp: 30 g, Rfl: 0 .  LORazepam (ATIVAN) 0.5 MG tablet, Take 1 tablet (0.5 mg total) by mouth every 6 (six) hours as needed (Nausea or vomiting). (Patient not taking: Reported on 06/08/2018), Disp: 30 tablet, Rfl: 0 .  ondansetron (ZOFRAN ODT) 8 MG disintegrating tablet, Take 1 tablet (8 mg total) by mouth every 8 (eight) hours as needed for nausea or vomiting. (Patient not taking: Reported on 06/08/2018), Disp: 20 tablet, Rfl: 0 .  ondansetron (ZOFRAN) 8 MG tablet, Take 1 tablet (8 mg total) by mouth 2 (two) times daily as needed for refractory nausea / vomiting. Start on day 3 after chemo. (Patient not taking: Reported on 06/08/2018), Disp: 30 tablet, Rfl: 1 .  prochlorperazine (COMPAZINE) 10 MG tablet, Take 1 tablet (10 mg total) by mouth every 6 (six) hours as needed (Nausea or vomiting). (Patient not taking: Reported on 06/08/2018), Disp: 30 tablet, Rfl: 1 .  traMADol (ULTRAM) 50 MG tablet, Take 1 tablet (50 mg total) by mouth every 6 (six) hours as needed. (Patient not taking: Reported on 06/08/2018), Disp: 30 tablet, Rfl: 0  Physical exam:  Vitals:   06/08/18 1423  BP: 127/85  Pulse: 74  Resp: 18  Temp: 98.7 F (37.1 C)  TempSrc: Tympanic  Weight: 128 lb 4.8 oz (58.2 kg)   Physical Exam Constitutional:      Comments: Frail woman who appears older than stated age.  HENT:     Head: Normocephalic and atraumatic.  Eyes:     Pupils: Pupils are equal, round, and reactive to light.  Neck:     Musculoskeletal: Normal range of motion.  Cardiovascular:     Rate and Rhythm: Normal rate and regular rhythm.     Heart sounds: Normal heart sounds.  Pulmonary:     Effort: Pulmonary effort is normal.     Breath sounds: Normal breath sounds.  Abdominal:     General: Bowel sounds are normal.  Palpations: Abdomen is soft.  Skin:    General: Skin is warm and dry.  Neurological:     Mental Status: She is alert and oriented to person, place, and  time.      CMP Latest Ref Rng & Units 06/05/2018  Glucose 70 - 99 mg/dL 84  BUN 8 - 23 mg/dL 13  Creatinine 0.44 - 1.00 mg/dL 0.62  Sodium 135 - 145 mmol/L 139  Potassium 3.5 - 5.1 mmol/L 3.7  Chloride 98 - 111 mmol/L 105  CO2 22 - 32 mmol/L 27  Calcium 8.9 - 10.3 mg/dL 9.0  Total Protein 6.5 - 8.1 g/dL 6.9  Total Bilirubin 0.3 - 1.2 mg/dL 0.4  Alkaline Phos 38 - 126 U/L 69  AST 15 - 41 U/L 33  ALT 0 - 44 U/L 22   CBC Latest Ref Rng & Units 06/05/2018  WBC 4.0 - 10.5 K/uL 5.8  Hemoglobin 12.0 - 15.0 g/dL 13.1  Hematocrit 36.0 - 46.0 % 42.5  Platelets 150 - 400 K/uL 183    No images are attached to the encounter.  Ct Chest W Contrast  Result Date: 06/05/2018 CLINICAL DATA:  Restaging breast cancer. EXAM: CT CHEST, ABDOMEN, AND PELVIS WITH CONTRAST TECHNIQUE: Multidetector CT imaging of the chest, abdomen and pelvis was performed following the standard protocol during bolus administration of intravenous contrast. CONTRAST:  145m ISOVUE-300 IOPAMIDOL (ISOVUE-300) INJECTION 61% COMPARISON:  CT scan 11/06/2017 and PET-CT 11/21/2016 FINDINGS: CT CHEST FINDINGS Cardiovascular: The heart is within normal limits in size and stable. No pericardial effusion. Stable aortic calcifications but no focal aneurysm or dissection. The branch vessels are patent. Stable coronary artery calcifications. Mediastinum/Nodes: No mediastinal or hilar mass or lymphadenopathy. Small scattered lymph nodes are stable. The esophagus is grossly normal. Lungs/Pleura: Stable emphysematous changes and areas of pulmonary scarring. Stable 3 mm left apical nodule on image 17. Stable lobulated 12 x 12 mm superior segment left lower lobe nodule on image number 55. Stable subpleural ground-glass nodule in the right lower lobe on image number 72 measuring 10.5 mm. Stable vague nodular opacities in the right middle lobe on image number 70. No new pulmonary lesions.  No pleural effusion. Musculoskeletal: The right-sided Port-A-Cath is  stable. No breast masses, supraclavicular or axillary adenopathy. Surgical and possible radiation changes involving the right breast. The thyroid gland appears normal. No significant bony findings. CT ABDOMEN PELVIS FINDINGS Hepatobiliary: No focal hepatic lesions or intrahepatic biliary dilatation. The portal and hepatic veins are patent. Gallbladder is normal. No common bile duct dilatation. Pancreas: No mass, inflammation or ductal dilatation. Spleen: Normal size.  No focal lesions. Adrenals/Urinary Tract: Surgical changes from a right adrenalectomy persistent/stable soft tissue density in the adrenal bed measuring 21 x 15 mm. This previously measured 21 x 18 mm. The left adrenal gland is normal. Both kidneys are unremarkable and stable. Small scattered cysts. No worrisome renal lesions or hydronephrosis. The bladder is unremarkable. Stomach/Bowel: The stomach, duodenum, small bowel and colon are unremarkable. No acute inflammatory changes, mass lesions or obstructive findings. The terminal ileum and appendix are normal. Vascular/Lymphatic: Stable atherosclerotic calcifications involving the aorta and branch vessels. No aneurysm or dissection. The major venous structures are patent. No mesenteric or retroperitoneal mass or adenopathy. Small scattered lymph nodes are stable. Reproductive: The uterus and ovaries are unremarkable and stable for age. Other: Small amount of free pelvic fluid is noted. Musculoskeletal: No significant bony findings. IMPRESSION: 1. Stable pulmonary lesions. Recommend continued surveillance. No new or progressive findings. 2. No  mediastinal or hilar mass or adenopathy. 3. Surgical changes involving the right breast. No recurrent breast mass or chest wall lesion. 4. Status post right adrenalectomy. Residual soft tissue density in the adrenal bed. 5. No CT findings for metastatic disease involving the abdomen/pelvis. Electronically Signed   By: Marijo Sanes M.D.   On: 06/05/2018 14:47    Ct Abdomen Pelvis W Contrast  Result Date: 06/05/2018 CLINICAL DATA:  Restaging breast cancer. EXAM: CT CHEST, ABDOMEN, AND PELVIS WITH CONTRAST TECHNIQUE: Multidetector CT imaging of the chest, abdomen and pelvis was performed following the standard protocol during bolus administration of intravenous contrast. CONTRAST:  132m ISOVUE-300 IOPAMIDOL (ISOVUE-300) INJECTION 61% COMPARISON:  CT scan 11/06/2017 and PET-CT 11/21/2016 FINDINGS: CT CHEST FINDINGS Cardiovascular: The heart is within normal limits in size and stable. No pericardial effusion. Stable aortic calcifications but no focal aneurysm or dissection. The branch vessels are patent. Stable coronary artery calcifications. Mediastinum/Nodes: No mediastinal or hilar mass or lymphadenopathy. Small scattered lymph nodes are stable. The esophagus is grossly normal. Lungs/Pleura: Stable emphysematous changes and areas of pulmonary scarring. Stable 3 mm left apical nodule on image 17. Stable lobulated 12 x 12 mm superior segment left lower lobe nodule on image number 55. Stable subpleural ground-glass nodule in the right lower lobe on image number 72 measuring 10.5 mm. Stable vague nodular opacities in the right middle lobe on image number 70. No new pulmonary lesions.  No pleural effusion. Musculoskeletal: The right-sided Port-A-Cath is stable. No breast masses, supraclavicular or axillary adenopathy. Surgical and possible radiation changes involving the right breast. The thyroid gland appears normal. No significant bony findings. CT ABDOMEN PELVIS FINDINGS Hepatobiliary: No focal hepatic lesions or intrahepatic biliary dilatation. The portal and hepatic veins are patent. Gallbladder is normal. No common bile duct dilatation. Pancreas: No mass, inflammation or ductal dilatation. Spleen: Normal size.  No focal lesions. Adrenals/Urinary Tract: Surgical changes from a right adrenalectomy persistent/stable soft tissue density in the adrenal bed measuring 21 x  15 mm. This previously measured 21 x 18 mm. The left adrenal gland is normal. Both kidneys are unremarkable and stable. Small scattered cysts. No worrisome renal lesions or hydronephrosis. The bladder is unremarkable. Stomach/Bowel: The stomach, duodenum, small bowel and colon are unremarkable. No acute inflammatory changes, mass lesions or obstructive findings. The terminal ileum and appendix are normal. Vascular/Lymphatic: Stable atherosclerotic calcifications involving the aorta and branch vessels. No aneurysm or dissection. The major venous structures are patent. No mesenteric or retroperitoneal mass or adenopathy. Small scattered lymph nodes are stable. Reproductive: The uterus and ovaries are unremarkable and stable for age. Other: Small amount of free pelvic fluid is noted. Musculoskeletal: No significant bony findings. IMPRESSION: 1. Stable pulmonary lesions. Recommend continued surveillance. No new or progressive findings. 2. No mediastinal or hilar mass or adenopathy. 3. Surgical changes involving the right breast. No recurrent breast mass or chest wall lesion. 4. Status post right adrenalectomy. Residual soft tissue density in the adrenal bed. 5. No CT findings for metastatic disease involving the abdomen/pelvis. Electronically Signed   By: PMarijo SanesM.D.   On: 06/05/2018 14:47     Assessment and plan- Patient is a 63y.o. 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 Final path showed 5  mm grade 3 Er weakly positive 1-10%, pr negative and her 2 negative tumor.Oncotype came back as high risk.  She completed 4 cycles of adjuvant TC chemotherapy in December 2019 and is here to discuss results of CT scan  I have personally reviewed CT chest  abdomen and pelvis images independently and discussed findings with the patient.  Patient has known lung nodules which are essentially stable.  There are no new findings of metastatic disease on her scans at this time.  She has completed 4 cycles of chemotherapy and no further treatment is needed at this time.  I will see her back in 6 months time with CBC CMP and CT chest abdomen and pelvis with contrast done prior.  She does have a port in place which would need to be flushed every 6 to 8 weeks.  She would be due for a repeat mammogram in July 2020 which will be coordinated by Dr. Bary Castilla  She also has a history of B12 deficiency and will be receiving monthly B12 shots   Visit Diagnosis 1. Malignant neoplasm of lower-outer quadrant of right breast of female, estrogen receptor negative ([place])   2. B12 deficiency      Dr. Randa Evens, MD, MPH [facility] [phone] 06/11/2018 12:48 PM

## 2018-06-12 ENCOUNTER — Ambulatory Visit (INDEPENDENT_AMBULATORY_CARE_PROVIDER_SITE_OTHER): Payer: PRIVATE HEALTH INSURANCE | Admitting: General Surgery

## 2018-06-12 ENCOUNTER — Other Ambulatory Visit: Payer: Self-pay

## 2018-06-12 ENCOUNTER — Encounter: Payer: Self-pay | Admitting: Family Medicine

## 2018-06-12 ENCOUNTER — Encounter: Payer: Self-pay | Admitting: General Surgery

## 2018-06-12 ENCOUNTER — Ambulatory Visit (INDEPENDENT_AMBULATORY_CARE_PROVIDER_SITE_OTHER): Payer: PRIVATE HEALTH INSURANCE | Admitting: Family Medicine

## 2018-06-12 VITALS — BP 128/71 | HR 71 | Temp 97.6°F | Resp 16 | Wt 130.0 lb

## 2018-06-12 VITALS — BP 130/81 | HR 78 | Temp 97.9°F | Resp 16 | Ht 61.0 in | Wt 130.6 lb

## 2018-06-12 DIAGNOSIS — Z171 Estrogen receptor negative status [ER-]: Secondary | ICD-10-CM

## 2018-06-12 DIAGNOSIS — C50511 Malignant neoplasm of lower-outer quadrant of right female breast: Secondary | ICD-10-CM

## 2018-06-12 DIAGNOSIS — D0462 Carcinoma in situ of skin of left upper limb, including shoulder: Secondary | ICD-10-CM | POA: Diagnosis not present

## 2018-06-12 DIAGNOSIS — L089 Local infection of the skin and subcutaneous tissue, unspecified: Secondary | ICD-10-CM | POA: Diagnosis not present

## 2018-06-12 MED ORDER — DOXYCYCLINE HYCLATE 100 MG PO TABS: 100.0000 mg | ORAL_TABLET | Freq: Two times a day (BID) | ORAL | 0 refills | Status: DC

## 2018-06-12 NOTE — Progress Notes (Signed)
Patient ID: Denise Watts, female   DOB: November 01, 1955, 63 y.o.   MRN: [id number]  Chief Complaint  Patient presents with  . Follow-up    right breast cancer    HPI Denise Watts is a 63 y.o. female. Here for follow up right breast cancer. She completed her chemotherapy in December. She states she still has some right breast pain.  She is here with her husband, [name]. She states she has a sore on her left hand.  HPI  Past Medical History:  Diagnosis Date  . Arthritis    KNEE RIGHT  . Breast cancer [facility]) 1997   right breast lumpectomy with radiation  . Breast cancer ([place]) 11/15/2017   INVASIVE MAMMARY CARCINOMA/ triple negative  . 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 Cough 03/02/2017   INTERMTTENT DRY COUGH  . GERD (gastroesophageal reflux disease)   . History of hiatal hernia   . Personal history of radiation therapy   . Skin cancer   . Throat cancer [facility]) 1998   radiation    Past Surgical History:  Procedure Laterality Date  . BREAST BIOPSY Right 1997   positive  . BREAST BIOPSY Right 11/15/2017   INVASIVE MAMMARY CARCINOMA triple negative  . BREAST LUMPECTOMY Right 1997  . BREAST LUMPECTOMY WITH SENTINEL LYMPH NODE BIOPSY Right 12/08/2017   Procedure: BREAST LUMPECTOMY WITH SENTINEL LYMPH NODE BX;  Surgeon: Robert Bellow, MD;  Location: [facility] ORS;  Service: General;  Laterality: Right;  . COLONOSCOPY  2015  . ESOPHAGUS SURGERY    . KNEE SURGERY Right   . PORTACATH PLACEMENT Right 01/17/2018   Procedure: INSERTION PORT-A-CATH- RIGHT;  Surgeon: Robert Bellow, MD;  Location: [facility] ORS;  Service: General;  Laterality: Right;  . ROBOTIC ADRENALECTOMY Right 12/28/2016   Procedure: ROBOTIC ADRENALECTOMY;  Surgeon: Hollice Espy, MD;  Location: [facility] ORS;  Service: Urology;  Laterality: Right;  . SKIN CANCER EXCISION    . THROAT SURGERY  1998   throat cancer   . TUBAL LIGATION    . VENTRAL HERNIA REPAIR N/A 03/03/2017   Procedure: HERNIA REPAIR VENTRAL ADULT;   Surgeon: Clayburn Pert, MD;  Location: [facility] ORS;  Service: General;  Laterality: N/A;    Family History  Problem Relation Age of Onset  . Diabetes Mother   . Heart attack Mother   . Cancer Sister        cancer of eyes, spread to lung, other places. died at 6  . Cancer Sister 70       unknown primary, spread to bone, brain died in 51's  . Cancer Paternal Aunt        unk type  . Cancer Paternal Grandmother        type unk  . Prostate cancer Neg Hx   . Kidney cancer Neg Hx   . Bladder Cancer Neg Hx     Social History Social History   Tobacco Use  . Smoking status: Former Smoker    Packs/day: 1.50    Years: 30.00    Pack years: 45.00    Types: Cigarettes    Last attempt to quit: 05/02/1996    Years since quitting: 22.1  . Smokeless tobacco: Never Used  Substance Use Topics  . Alcohol use: Yes    Alcohol/week: 0.0 standard drinks    Comment: OCC  . Drug use: No    No Known Allergies  Current Outpatient Medications  Medication Sig Dispense Refill  . Cyanocobalamin (B-12 COMPLIANCE INJECTION IJ)  Inject 1 Dose as directed every 30 (thirty) days.    Marland Kitchen HYDROcodone-acetaminophen (NORCO/VICODIN) 5-325 MG tablet Take 1 tablet by mouth every 4 (four) hours as needed for moderate pain. 20 tablet 0  . hydrocortisone cream 0.5 % Apply 1 application topically 2 (two) times daily. (Patient taking differently: Apply 1 application topically as needed. ) 30 g 0  . LORazepam (ATIVAN) 0.5 MG tablet Take 1 tablet (0.5 mg total) by mouth every 6 (six) hours as needed (Nausea or vomiting). 30 tablet 0  . morphine (ROXANOL) 20 MG/ML concentrated solution Take 0.5 mLs (10 mg total) by mouth every 3 (three) hours as needed for severe pain. 240 mL 0  . naproxen sodium (ALEVE) 220 MG tablet Take 220 mg by mouth daily as needed (pain).     Marland Kitchen omeprazole (PRILOSEC) 40 MG capsule TAKE 1 CAPSULE BY MOUTH EVERY DAY-AM 30 capsule 11  . clotrimazole (MYCELEX) 10 MG troche Take 1 tablet (10 mg total) by  mouth 5 (five) times daily. 50 tablet 0  . dexamethasone (DECADRON) 4 MG tablet Take 2 tablets (8 mg total) by mouth 2 (two) times daily. Start the day before Taxotere. Then again the day after chemo for 3 days. 30 tablet 1  . doxycycline (VIBRA-TABS) 100 MG tablet Take 1 tablet (100 mg total) by mouth 2 (two) times daily. 14 tablet 0  . gabapentin (NEURONTIN) 300 MG capsule Take 1 capsule (300 mg total) by mouth 3 (three) times daily. 90 capsule 0  . lidocaine-prilocaine (EMLA) cream Apply 1 application topically as needed. 30 g 0  . magic mouthwash w/lidocaine SOLN Take 5 mLs by mouth 4 (four) times daily. 180 mL 0  . ondansetron (ZOFRAN ODT) 8 MG disintegrating tablet Take 1 tablet (8 mg total) by mouth every 8 (eight) hours as needed for nausea or vomiting. 20 tablet 0  . ondansetron (ZOFRAN) 8 MG tablet Take 1 tablet (8 mg total) by mouth 2 (two) times daily as needed for refractory nausea / vomiting. Start on day 3 after chemo. 30 tablet 1  . prochlorperazine (COMPAZINE) 10 MG tablet Take 1 tablet (10 mg total) by mouth every 6 (six) hours as needed (Nausea or vomiting). 30 tablet 1  . traMADol (ULTRAM) 50 MG tablet Take 1 tablet (50 mg total) by mouth every 6 (six) hours as needed. 30 tablet 0   No current facility-administered medications for this visit.     Review of Systems Review of Systems  Constitutional: Negative.   Respiratory: Negative.   Cardiovascular: Negative.     Blood pressure 130/81, pulse 78, temperature 97.9 F (36.6 C), temperature source Skin, resp. rate 16, height 5\' 1"  (1.549 m), weight 130 lb 9.6 oz (59.2 kg), SpO2 96 %.  Physical Exam Physical Exam Constitutional:      Appearance: She is well-developed.  Eyes:     General: No scleral icterus.    Conjunctiva/sclera: Conjunctivae normal.  Neck:     Musculoskeletal: Neck supple.  Cardiovascular:     Rate and Rhythm: Normal rate and regular rhythm.     Heart sounds: Normal heart sounds.  Pulmonary:      Effort: Pulmonary effort is normal.     Breath sounds: Normal breath sounds.  Chest:     Breasts:        Right: No inverted nipple, mass, nipple discharge, skin change or tenderness.        Left: No inverted nipple, mass, nipple discharge, skin change or tenderness.  Musculoskeletal:       Arms:  Lymphadenopathy:     Cervical: No cervical adenopathy.     Upper Body:     Right upper body: No supraclavicular or axillary adenopathy.     Left upper body: No supraclavicular or axillary adenopathy.  Skin:    General: Skin is warm and dry.     Comments: Left hand nodule  Neurological:     Mental Status: She is alert and oriented to person, place, and time.  Psychiatric:        Behavior: Behavior normal.     Data Reviewed CT of the chest abdomen and pelvis dated June 05, 2018 showed stable pulmonary lesions, no evidence of metastatic disease. Review of the chest component of the CT shows calcifications in the surgical field.  Moderate soft tissue thickening.  Assessment    Stable breast exam.  Bilateral mammograms to be scheduled in July 2020.  Verruca-like lesion on the left hand.  Patient has an appointment to see her PCP this afternoon in this regard.  If this does not respond to cryotherapy it can be excised.    Plan      The patient has been asked to return to the office in 5 months with a bilateral diagnostic mammogram.     HPI, assessment, plan and physical exam has been scribed under the direction and in the presence of Robert Bellow, MD. Karie Fetch, RN  I have completed the exam and reviewed the above documentation for accuracy and completeness.  I agree with the above.  Haematologist has been used and any errors in dictation or transcription are unintentional.  Hervey Ard, M.D., F.A.C.S.   Forest Gleason Jocilyn Trego 06/13/2018, 6:58 PM

## 2018-06-12 NOTE — Patient Instructions (Signed)
The patient has been asked to return to the office in 5 months with a bilateral diagnostic mammogram.  

## 2018-06-22 ENCOUNTER — Inpatient Hospital Stay: Payer: PRIVATE HEALTH INSURANCE

## 2018-06-22 DIAGNOSIS — C50511 Malignant neoplasm of lower-outer quadrant of right female breast: Secondary | ICD-10-CM | POA: Diagnosis not present

## 2018-06-22 DIAGNOSIS — Z95828 Presence of other vascular implants and grafts: Secondary | ICD-10-CM

## 2018-06-22 DIAGNOSIS — Z171 Estrogen receptor negative status [ER-]: Secondary | ICD-10-CM

## 2018-06-22 LAB — COMPREHENSIVE METABOLIC PANEL
ALT: 16 U/L (ref 0–44)
AST: 26 U/L (ref 15–41)
Albumin: 3.9 g/dL (ref 3.5–5.0)
Alkaline Phosphatase: 66 U/L (ref 38–126)
Anion gap: 9 (ref 5–15)
BUN: 9 mg/dL (ref 8–23)
CO2: 25 mmol/L (ref 22–32)
Calcium: 9 mg/dL (ref 8.9–10.3)
Chloride: 106 mmol/L (ref 98–111)
Creatinine, Ser: 0.65 mg/dL (ref 0.44–1.00)
GFR calc Af Amer: >60 mL/min (ref >60)
GFR calc non Af Amer: >60 mL/min (ref >60)
Glucose, Bld: 136 mg/dL — ABNORMAL HIGH (ref 70–99)
Potassium: 4 mmol/L (ref 3.5–5.1)
Sodium: 140 mmol/L (ref 135–145)
Total Bilirubin: 0.6 mg/dL (ref 0.3–1.2)
Total Protein: 6.6 g/dL (ref 6.5–8.1)

## 2018-06-22 LAB — CBC WITH DIFFERENTIAL/PLATELET
Abs Immature Granulocytes: 0.01 10*3/uL (ref 0.00–0.07)
BASOS ABS: 0 10*3/uL (ref 0.0–0.1)
Basophils Relative: 1 %
Eosinophils Absolute: 0.2 10*3/uL (ref 0.0–0.5)
Eosinophils Relative: 4 %
HCT: 41 % (ref 36.0–46.0)
Hemoglobin: 13.3 g/dL (ref 12.0–15.0)
Immature Granulocytes: 0 %
Lymphocytes Relative: 22 %
Lymphs Abs: 1 10*3/uL (ref 0.7–4.0)
MCH: 31.4 pg (ref 26.0–34.0)
MCHC: 32.4 g/dL (ref 30.0–36.0)
MCV: 96.9 fL (ref 80.0–100.0)
Monocytes Absolute: 0.5 10*3/uL (ref 0.1–1.0)
Monocytes Relative: 10 %
Neutro Abs: 2.9 10*3/uL (ref 1.7–7.7)
Neutrophils Relative %: 63 %
Platelets: 181 10*3/uL (ref 150–400)
RBC: 4.23 MIL/uL (ref 3.87–5.11)
RDW: 12 % (ref 11.5–15.5)
WBC: 4.5 10*3/uL (ref 4.0–10.5)
nRBC: 0 % (ref 0.0–0.2)

## 2018-06-22 MED ORDER — HEPARIN SOD (PORK) LOCK FLUSH 100 UNIT/ML IV SOLN
500.0000 [IU] | Freq: Once | INTRAVENOUS | Status: AC
  Administered 2018-06-22: 500 [IU] via INTRAVENOUS

## 2018-06-22 MED ORDER — HEPARIN SOD (PORK) LOCK FLUSH 100 UNIT/ML IV SOLN
INTRAVENOUS | Status: AC
  Filled 2018-06-22: qty 5

## 2018-06-22 MED ORDER — SODIUM CHLORIDE 0.9% FLUSH
10.0000 mL | Freq: Once | INTRAVENOUS | Status: AC
  Administered 2018-06-22: 10 mL via INTRAVENOUS
  Filled 2018-06-22: qty 10

## 2018-07-06 ENCOUNTER — Inpatient Hospital Stay: Payer: PRIVATE HEALTH INSURANCE | Attending: Oncology

## 2018-07-06 ENCOUNTER — Inpatient Hospital Stay: Payer: PRIVATE HEALTH INSURANCE

## 2018-07-06 DIAGNOSIS — Z171 Estrogen receptor negative status [ER-]: Secondary | ICD-10-CM | POA: Insufficient documentation

## 2018-07-06 DIAGNOSIS — Z923 Personal history of irradiation: Secondary | ICD-10-CM | POA: Diagnosis not present

## 2018-07-06 DIAGNOSIS — Z9221 Personal history of antineoplastic chemotherapy: Secondary | ICD-10-CM | POA: Insufficient documentation

## 2018-07-06 DIAGNOSIS — C7951 Secondary malignant neoplasm of bone: Secondary | ICD-10-CM | POA: Diagnosis not present

## 2018-07-06 DIAGNOSIS — C50511 Malignant neoplasm of lower-outer quadrant of right female breast: Secondary | ICD-10-CM | POA: Insufficient documentation

## 2018-07-06 DIAGNOSIS — E538 Deficiency of other specified B group vitamins: Secondary | ICD-10-CM | POA: Insufficient documentation

## 2018-07-06 MED ORDER — CYANOCOBALAMIN 1000 MCG/ML IJ SOLN
1000.0000 ug | INTRAMUSCULAR | Status: DC
  Administered 2018-07-06: 1000 ug via INTRAMUSCULAR

## 2018-08-03 ENCOUNTER — Ambulatory Visit
Admission: RE | Admit: 2018-08-03 | Discharge: 2018-08-03 | Disposition: A | Discharge status: Home / Self Care | Payer: Medicare HMO | Source: Ambulatory Visit | Attending: Radiation Oncology | Admitting: Radiation Oncology

## 2018-08-03 ENCOUNTER — Ambulatory Visit: Payer: Self-pay

## 2018-08-03 ENCOUNTER — Inpatient Hospital Stay: Payer: Medicare HMO

## 2018-08-03 ENCOUNTER — Inpatient Hospital Stay: Payer: Medicare HMO | Attending: Oncology

## 2018-08-03 ENCOUNTER — Encounter: Payer: Self-pay | Admitting: Radiation Oncology

## 2018-08-03 ENCOUNTER — Other Ambulatory Visit: Payer: Self-pay

## 2018-08-03 VITALS — BP 127/81 | HR 68 | Temp 99.7°F | Resp 18 | Wt 128.9 lb

## 2018-08-03 DIAGNOSIS — C50411 Malignant neoplasm of upper-outer quadrant of right female breast: Secondary | ICD-10-CM

## 2018-08-03 DIAGNOSIS — C7971 Secondary malignant neoplasm of right adrenal gland: Secondary | ICD-10-CM | POA: Diagnosis not present

## 2018-08-03 DIAGNOSIS — E538 Deficiency of other specified B group vitamins: Secondary | ICD-10-CM

## 2018-08-03 DIAGNOSIS — Z923 Personal history of irradiation: Secondary | ICD-10-CM | POA: Diagnosis not present

## 2018-08-03 DIAGNOSIS — C7951 Secondary malignant neoplasm of bone: Secondary | ICD-10-CM | POA: Insufficient documentation

## 2018-08-03 DIAGNOSIS — Z95828 Presence of other vascular implants and grafts: Secondary | ICD-10-CM

## 2018-08-03 DIAGNOSIS — Z171 Estrogen receptor negative status [ER-]: Secondary | ICD-10-CM | POA: Insufficient documentation

## 2018-08-03 DIAGNOSIS — Z9221 Personal history of antineoplastic chemotherapy: Secondary | ICD-10-CM | POA: Insufficient documentation

## 2018-08-03 DIAGNOSIS — C50511 Malignant neoplasm of lower-outer quadrant of right female breast: Secondary | ICD-10-CM | POA: Insufficient documentation

## 2018-08-03 DIAGNOSIS — Z170 Estrogen receptor positive status [ER+]: Secondary | ICD-10-CM | POA: Diagnosis not present

## 2018-08-03 DIAGNOSIS — R918 Other nonspecific abnormal finding of lung field: Secondary | ICD-10-CM | POA: Insufficient documentation

## 2018-08-03 MED ORDER — CYANOCOBALAMIN 1000 MCG/ML IJ SOLN
1000.0000 ug | INTRAMUSCULAR | Status: AC
  Administered 2018-08-03: 1000 ug via INTRAMUSCULAR
  Filled 2018-08-03: qty 1

## 2018-08-03 MED ORDER — SODIUM CHLORIDE 0.9% FLUSH
10.0000 mL | Freq: Once | INTRAVENOUS | Status: AC
  Administered 2018-08-03: 10:00:00 10 mL via INTRAVENOUS
  Filled 2018-08-03: qty 10

## 2018-08-03 MED ORDER — HEPARIN SOD (PORK) LOCK FLUSH 100 UNIT/ML IV SOLN
INTRAVENOUS | Status: AC
  Filled 2018-08-03: qty 5

## 2018-08-03 MED ORDER — HEPARIN SOD (PORK) LOCK FLUSH 100 UNIT/ML IV SOLN
500.0000 [IU] | Freq: Once | INTRAVENOUS | Status: AC
  Administered 2018-08-03: 500 [IU] via INTRAVENOUS

## 2018-08-03 NOTE — Progress Notes (Signed)
Radiation Oncology Follow up Note  Name: Denise Watts   Date:   08/03/2018 MRN:  [id number] DOB: December 04, 1955    This 63 y.o. female presents to the clinic today for 86-month follow-up status post recurrent invasive mammary carcinoma the right breast previously treated with wide local excision radiation in 1997 who underwent partial breast irradiation at that time.Marland Kitchen  REFERRING PROVIDER: Margo Common, PA  HPI: Patient is a 63 year old female now about 6 months having completed partial breast irradiation using external beam treatment for recurrent invasive mammary carcinoma the right breast.  She was status post wide local excision and radiation therapy to her whole breast back in 1997.  She is seen today in routine follow-up still having some significant pain in the lumpectomy site.  There is significant scarring in the area of repeat partial breast radiation..  She does have a pulmonary lesion on her last CT scan which I have reviewed back in February shows lesion to be stable with no progressive changes.  Also there was surgical changes involving the right breast with no recurrent breast mass or chest wall lesions identified.  COMPLICATIONS OF TREATMENT: none  FOLLOW UP COMPLIANCE: keeps appointments   PHYSICAL EXAM:  BP 127/81 (BP Location: Left Arm, Patient Position: Sitting)   Pulse 68   Temp 99.7 F (37.6 C) (Tympanic)   Resp 18   Wt 128 lb 13.7 oz (58.4 kg)   BMI 24.35 kg/m  Area of the lower inframammary region of her right breast shows significant scarring.  No dominant mass or nodularity is noted in either breast in 2 positions examined.  No axillary or supraclavicular adenopathy is identified.  Well-developed well-nourished patient in NAD. HEENT reveals PERLA, EOMI, discs not visualized.  Oral cavity is clear. No oral mucosal lesions are identified. Neck is clear without evidence of cervical or supraclavicular adenopathy. Lungs are clear to A&P. Cardiac examination is essentially  unremarkable with regular rate and rhythm without murmur rub or thrill. Abdomen is benign with no organomegaly or masses noted. Motor sensory and DTR levels are equal and symmetric in the upper and lower extremities. Cranial nerves II through XII are grossly intact. Proprioception is intact. No peripheral adenopathy or edema is identified. No motor or sensory levels are noted. Crude visual fields are within normal range.  RADIOLOGY RESULTS: CT scan of her chest is reviewed and compatible with above-stated findings  PLAN: Present time patient is doing fairly well continues to have pain in that area I have suggested some cold compresses and Tylenol as needed.  She continues close follow-up care with Dr. Tollie Pizza.  I have asked to see her back in 6 months for follow-up.  I have asked her to have mammograms performed by that time which will most likely be scheduled through Dr. Barbette Hair office.  Patient is to call at anytime with any concerns.  I would like to take this opportunity to thank you for allowing me to participate in the care of your patient.Noreene Filbert, MD

## 2018-08-08 ENCOUNTER — Telehealth: Payer: Self-pay

## 2018-08-08 DIAGNOSIS — D0471 Carcinoma in situ of skin of right lower limb, including hip: Secondary | ICD-10-CM | POA: Diagnosis not present

## 2018-08-08 DIAGNOSIS — C44729 Squamous cell carcinoma of skin of left lower limb, including hip: Secondary | ICD-10-CM | POA: Diagnosis not present

## 2018-08-08 DIAGNOSIS — C44712 Basal cell carcinoma of skin of right lower limb, including hip: Secondary | ICD-10-CM | POA: Diagnosis not present

## 2018-08-08 DIAGNOSIS — C44319 Basal cell carcinoma of skin of other parts of face: Secondary | ICD-10-CM | POA: Diagnosis not present

## 2018-08-08 DIAGNOSIS — C44719 Basal cell carcinoma of skin of left lower limb, including hip: Secondary | ICD-10-CM | POA: Diagnosis not present

## 2018-08-08 DIAGNOSIS — C44619 Basal cell carcinoma of skin of left upper limb, including shoulder: Secondary | ICD-10-CM | POA: Diagnosis not present

## 2018-08-08 DIAGNOSIS — C44622 Squamous cell carcinoma of skin of right upper limb, including shoulder: Secondary | ICD-10-CM | POA: Diagnosis not present

## 2018-08-08 DIAGNOSIS — R208 Other disturbances of skin sensation: Secondary | ICD-10-CM | POA: Diagnosis not present

## 2018-08-08 DIAGNOSIS — C44612 Basal cell carcinoma of skin of right upper limb, including shoulder: Secondary | ICD-10-CM | POA: Diagnosis not present

## 2018-08-08 DIAGNOSIS — C44529 Squamous cell carcinoma of skin of other part of trunk: Secondary | ICD-10-CM | POA: Diagnosis not present

## 2018-08-08 DIAGNOSIS — C44629 Squamous cell carcinoma of skin of left upper limb, including shoulder: Secondary | ICD-10-CM | POA: Diagnosis not present

## 2018-08-08 DIAGNOSIS — C44722 Squamous cell carcinoma of skin of right lower limb, including hip: Secondary | ICD-10-CM | POA: Diagnosis not present

## 2018-08-08 DIAGNOSIS — D0462 Carcinoma in situ of skin of left upper limb, including shoulder: Secondary | ICD-10-CM | POA: Diagnosis not present

## 2018-08-08 DIAGNOSIS — D485 Neoplasm of uncertain behavior of skin: Secondary | ICD-10-CM | POA: Diagnosis not present

## 2018-08-08 DIAGNOSIS — D0461 Carcinoma in situ of skin of right upper limb, including shoulder: Secondary | ICD-10-CM | POA: Diagnosis not present

## 2018-08-08 DIAGNOSIS — C44519 Basal cell carcinoma of skin of other part of trunk: Secondary | ICD-10-CM | POA: Diagnosis not present

## 2018-08-08 NOTE — Telephone Encounter (Signed)
Telephone call to patient to discuss SCP visit.  Patient agreeable to have packet mailed to her and I will call her on the telephone because she does not have access to device with a camera.  SCP visit made for April 14th at 1:00 to review SCP and discuss resources and transitioning for post chemo care.  Also informed patient APP with be talking with her on this same day to discuss any side effects and post chemo care with her.  Patient verbalized understanding and eager for appointment.

## 2018-08-14 ENCOUNTER — Inpatient Hospital Stay (HOSPITAL_BASED_OUTPATIENT_CLINIC_OR_DEPARTMENT_OTHER): Payer: Medicare HMO | Admitting: Nurse Practitioner

## 2018-08-14 DIAGNOSIS — Z87891 Personal history of nicotine dependence: Secondary | ICD-10-CM

## 2018-08-14 DIAGNOSIS — Z853 Personal history of malignant neoplasm of breast: Secondary | ICD-10-CM

## 2018-08-14 DIAGNOSIS — Z08 Encounter for follow-up examination after completed treatment for malignant neoplasm: Secondary | ICD-10-CM

## 2018-08-14 NOTE — Progress Notes (Signed)
Virtual Visit via Telephone Note  [place] [facility]  [phone] [phone]  I connected with Denise Watts on 08/14/18 at 1:00 PM by telephone visit and verified that I am speaking with the correct person using two identifiers.   I discussed the limitations, risks, security and privacy concerns of performing an evaluation and management service by telemedicine and the availability of in-person appointments. I also discussed with the patient that there may be a patient responsible charge related to this service. The patient expressed understanding and agreed to proceed.   Other persons participating in the visit and their role in the encounter: Magdalene Patricia, RN (checking patient in and review survivorship care plan)  Patient's location: home  Provider's location: clinic  Chief Complaint: history of breast cancer  Patient Care Team: Chrismon, Vickki Muff, PA as PCP - General (Physician Assistant) Rico Junker, RN as Oncology Nurse Navigator Sindy Guadeloupe, MD as Consulting Physician (Oncology) Bary Castilla Forest Gleason, MD (General Surgery)   Name of the patient: Denise Watts  [id number]  Feb 10, 1956   Date of visit: 08/14/18  CLINIC: Survivorship   REASON FOR VISIT:  Routine follow-up for a history of breast cancer.  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  History of lung nodules (abnormal chest CT 04/2014 with stable nodule on repeat exam) and history of smoking.  She was noted to have adrenal mass on CT chest which prompted PET/CT in July 2018 and underwent right adrenalectomy which showed high-grade carcinoma with extensive necrosis and negative margins.  She was noted to have a 4.5 mm nodule in her right breast and underwent mammogram on  11/10/2017 which showed suspicious mass in the 8:00 region of the right breast.  She underwent biopsy with pathology consistent with invasive mammary carcinoma-ER/PR/HER-2 negative.  Clinical stage Ib. she underwent lumpectomy on 12/08/2017 with Dr. Bary Castilla.  Final pathology showed 5 mm grade 3 ER weakly +1-10%, PR negative, HER-2 negative.  Oncotype came back as high risk and she received 4 cycles of adjuvant Taxotere-Cytoxan chemotherapy from 01/30/2018-04/26/2018. She received radiation 35 Gy from 01/11/2018-01/17/2018.  Genetic testing: [employer] multi-cancer panel on 01/25/2018 showed no pathogenic mutations   HISTORY OF PRESENTING ILLNESS: Patient agreed to evaluation by telephone/telemedicine to discuss history of breast cancer. Patient presents to the survivorship clinic today for initial meeting to review her survivorship care plan detailing her treatment course for breast cancer, as well as monitoring long-term side effects of that treatment, education regarding health maintenance, screening, and overall wellness and health promotion.  Today, she reports continued throbbing pain at surgical site despite area appearing well-healed.  No swelling or heat.  She saw Dr. Baruch Gouty on 08/03/2018 with similar complaints and was encouraged to use cold compresses and Tylenol as needed.  She reports stress from Covid-19 pandemic been eating less lost approximately 10 pounds in past few weeks.  Otherwise she denies specific complaints  REVIEW OF SYSTEMS:  General: Denies fever, chills, or generalized fatigue.  HEENT: Denies changes in vision or hearing. Denies oral lesions. Cardiac: Denies palpitations, chest pain, lower extremity edema, and orthopnea.  Respiratory: Denies cough, shortness of breath, and dyspnea on exertion.  GI: Denies abdominal pain, constipation, diarrhea, nausea, or vomiting.  GU: Denies dysuria, hematuria, vaginal bleeding, vaginal discharge, or vaginal dryness.  Musculoskeletal: Denies joint  or bone pain.  Neuro: Denies headache or recent falls. Denies peripheral neuropathy. Skin: Denies rash, pruritis, or open wounds.  Breast: Denies any new nodularity, masses, tenderness, nipple changes, or nipple discharge.  Psych: Denies depression, anxiety, insomnia, or memory loss.   BRIEF ONCOLOGIC HISTORY:  Oncology History   1. Carcinoma of breast, T1, N0, M0 tumor diagnosis.  In January of 1997 2. Carcinoma of the vocal cord T1, N0, M0 tumor.  Status postradiation therapy 3. Abnormal CT scan of the chest (December, 2015) 4. Repeat CT scan of chest shows stable nodule (July, 2016) 5. Neutropenia with normal hemoglobin and normal platelet count (July, 2016)     Cancer of vocal cord [facility])   11/10/2014 Initial Diagnosis    Cancer of vocal cord     Malignant neoplasm of lower-outer quadrant of right breast of female, estrogen receptor negative ([place])   11/24/2017 Initial Diagnosis    Malignant neoplasm of lower-outer quadrant of right breast of female, estrogen receptor negative ([place])    01/12/2018 Cancer Staging    Staging form: Breast, AJCC 8th Edition - Clinical stage from 01/12/2018: Stage IB (cT1a, cN0, cM0, G3, ER+, PR-, HER2-) - Signed by Sindy Guadeloupe, MD on 01/12/2018    01/30/2018 -  Chemotherapy    The patient had palonosetron (ALOXI) injection 0.25 mg, 0.25 mg, Intravenous,  Once, 4 of 4 cycles Administration: 0.25 mg (01/30/2018), 0.25 mg (02/27/2018), 0.25 mg (03/28/2018), 0.25 mg (04/26/2018) pegfilgrastim (NEULASTA ONPRO KIT) injection 6 mg, 6 mg, Subcutaneous, Once, 4 of 4 cycles Administration: 6 mg (01/30/2018), 6 mg (02/27/2018), 6 mg (03/28/2018), 6 mg (04/26/2018) cyclophosphamide (CYTOXAN) 1,000 mg in sodium chloride 0.9 % 250 mL chemo infusion, 600 mg/m2 = 1,000 mg, Intravenous,  Once, 4 of 4 cycles Administration: 1,000 mg (01/30/2018), 1,000 mg (02/27/2018), 1,000 mg (03/28/2018), 1,000 mg (04/26/2018) DOCEtaxel (TAXOTERE) 130 mg in sodium chloride 0.9 % 250 mL  chemo infusion, 75 mg/m2 = 130 mg, Intravenous,  Once, 4 of 4 cycles Dose modification: 60 mg/m2 (original dose 75 mg/m2, Cycle 2, Reason: Dose not tolerated) Administration: 130 mg (01/30/2018), 100 mg (02/27/2018), 100 mg (03/28/2018), 100 mg (04/26/2018)  for chemotherapy treatment.     ONCOLOGY TREATMENT TEAM:  1. Surgeon:  Dr. Bary Castilla 2. Medical Oncologist: Dr. Janese Banks 3. Radiation Oncologist: Dr. Baruch Gouty    PAST MEDICAL/SURGICAL HISTORY:  Past Medical History:  Diagnosis Date   Arthritis    KNEE RIGHT   Breast cancer [facility]) 1997   right breast lumpectomy with radiation   Breast cancer ([place]) 11/15/2017   INVASIVE MAMMARY CARCINOMA/ triple negative   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[place]) 11/10/2014   Cough 03/02/2017   INTERMTTENT DRY COUGH   GERD (gastroesophageal reflux disease)    History of hiatal hernia    Personal history of radiation therapy    Skin cancer    Throat cancer ([place]) 1998   radiation   Past Surgical History:  Procedure Laterality Date   BREAST BIOPSY Right 1997   positive   BREAST BIOPSY Right 11/15/2017   INVASIVE MAMMARY CARCINOMA triple negative   BREAST LUMPECTOMY Right 1997   BREAST LUMPECTOMY WITH SENTINEL LYMPH NODE BIOPSY Right 12/08/2017   Procedure: BREAST LUMPECTOMY WITH SENTINEL LYMPH NODE BX;  Surgeon: Robert Bellow, MD;  Location: [facility] ORS;  Service: General;  Laterality: Right;   COLONOSCOPY  2015   ESOPHAGUS SURGERY     KNEE SURGERY Right    PORTACATH PLACEMENT Right 01/17/2018   Procedure:  INSERTION PORT-A-CATH- RIGHT;  Surgeon: Robert Bellow, MD;  Location: [place] [facility];  Service: General;  Laterality: Right;   ROBOTIC ADRENALECTOMY Right 12/28/2016   Procedure: ROBOTIC ADRENALECTOMY;  Surgeon: Hollice Espy, MD;  Location: [facility] ORS;  Service: Urology;  Laterality: Right;   SKIN CANCER EXCISION     THROAT SURGERY  1998   throat cancer    TUBAL LIGATION     VENTRAL HERNIA REPAIR N/A 03/03/2017   Procedure: HERNIA  REPAIR VENTRAL ADULT;  Surgeon: Clayburn Pert, MD;  Location: [facility] ORS;  Service: General;  Laterality: N/A;    GYNECOLOGIC HISTORY:  Post menopausal  CURRENT MEDICATIONS:   Current Outpatient Medications:    clotrimazole (MYCELEX) 10 MG troche, Take 1 tablet (10 mg total) by mouth 5 (five) times daily., Disp: 50 tablet, Rfl: 0   Cyanocobalamin (B-12 COMPLIANCE INJECTION IJ), Inject 1 Dose as directed every 30 (thirty) days., Disp: , Rfl:    hydrocortisone cream 0.5 %, Apply 1 application topically 2 (two) times daily. (Patient taking differently: Apply 1 application topically as needed. ), Disp: 30 g, Rfl: 0   lidocaine-prilocaine (EMLA) cream, Apply 1 application topically as needed., Disp: 30 g, Rfl: 0   morphine (ROXANOL) 20 MG/ML concentrated solution, Take 0.5 mLs (10 mg total) by mouth every 3 (three) hours as needed for severe pain., Disp: 240 mL, Rfl: 0   naproxen sodium (ALEVE) 220 MG tablet, Take 220 mg by mouth daily as needed (pain). , Disp: , Rfl:    omeprazole (PRILOSEC) 40 MG capsule, TAKE 1 CAPSULE BY MOUTH EVERY DAY-AM, Disp: 30 capsule, Rfl: 11 No current facility-administered medications for this visit.   Facility-Administered Medications Ordered in Other Visits:    cyanocobalamin ((VITAMIN B-12)) injection 1,000 mcg, 1,000 mcg, Intramuscular, Q30 days, Sindy Guadeloupe, MD, 1,000 mcg at 08/03/18 1914   ONCOLOGIC FAMILY HISTORY: History of unknown type of cancer in paternal grandmother and an unknown type of cancer in paternal aunt.  History of cancer of eyes that spread to long and sister (deceased at age 8), history of cancer in sister with unknown primary and metastases to bone and brain (deceased at age 29)   SOCIAL HISTORY:  Social History   Socioeconomic History   Marital status: Married    Spouse name: Not on file   Number of children: Not on file   Years of education: Not on file   Highest education level: Not on file  Occupational History     Not on file  Social Needs   Financial resource strain: Not on file   Food insecurity    Worry: Not on file    Inability: Not on file   Transportation needs    Medical: Not on file    Non-medical: Not on file  Tobacco Use   Smoking status: Former Smoker    Packs/day: 1.50    Years: 30.00    Pack years: 45.00    Types: Cigarettes    Quit date: 05/02/1996    Years since quitting: 22.6   Smokeless tobacco: Never Used  Substance and Sexual Activity   Alcohol use: Yes    Alcohol/week: 0.0 standard drinks    Comment: OCC   Drug use: No   Sexual activity: Not on file  Lifestyle   Physical activity    Days per week: Not on file    Minutes per session: Not on file   Stress: Not on file  Relationships   Social connections    Talks on  phone: Not on file    Gets together: Not on file    Attends religious service: Not on file    Active member of club or organization: Not on file    Attends meetings of clubs or organizations: Not on file    Relationship status: Not on file  Other Topics Concern   Not on file  Social History Narrative   Not on file   PHYSICAL EXAMINATION:  Exam limited due to telemedicine  LABORATORY DATA:  I reviewed her last CBC and CMP in 06/22/2018 which were normal and stable.  DIAGNOSTIC IMAGING:  Mammogram on 11/10/2017 independently reviewed.  Next mammogram due 11/11/2018.    ASSESSMENT AND PLAN:  1. History of breast cancer:  Ms. Ricchio is continuing to recover from a definitive treatment for stage I breast cancer.  We discussed surveillance guidelines including recommendations for history and physical exam 1-4 times per year for first 5 years then annually thereafter.  She will receive mammograms every 12 months and should report any symptoms to her care team as this may prompt imaging or laboratory studies during intervals.  Given triple negative nature of her disease she would not benefit from adjuvant endocrine therapy.  She underwent  genetic testing which did not show any pathogenic variants.  I encouraged her to discuss any changes in family history with her care team should they occur. A comprehensive care plan and treatment summary were reviewed with the patient today detailing her breast cancer diagnosis, treatment course, potential late and long-term effects of treatment, appropriate follow-up care with recommendations for the future, and patient education resources.  A copy of this summary, was sent to the patient's primary care provider and this note will be routed after today's visit.   2. Problem(s) at Visit: - treatment site pain/breast pain- area does not sound infected but I discussed evaluation by surgery or in clinic if concerning or progressive symptoms. Encouraged tylenol for pain as recommended by radiation/oncology.   3. Bone Health:  Given Ms. Barwick's history of breast cancer and treatments, she is at risk for bone demineralization. Recommend DEXA scan this year to evaluate her current bone health but will let her discuss with Dr. Janese Banks due to covid-19 pandemic. She was encouraged to increase her consumption of foods rich in calcium, as well as increase her weight-bearing activities.  She was given education on specific food and activities to promote bone health.  4. Cancer screening:  Due to Ms. Sampsel's history and her age, she should receive screening for skin cancers, colon cancer, and gynecologic cancers.  The information and recommendations are listed on the patient's comprehensive care plan/treatment summary and were reviewed in detail with the patient.  These recommendations will also be sent to the patients primary care provider.   5. Health maintenance and wellness promotion: Ms. Vath was encouraged to consume 5-7 servings of fruits and vegetables per day. She was also encouraged to engage in moderate to vigorous exercise for 30 minutes per day most days of the week. She was instructed to limit her alcohol  consumption and continue to abstain from tobacco use/was encouraged stop smoking.  Her vaccinations are currently up-to-date and she reports that she did/did not receive the influenza vaccine this year.  Lastly, she was encouraged to use sunscreen and wear protective clothing when in the sun.     Follow Up Instructions:   Return to clinic as scheduled to follow up with Dr. Janese Banks for annual surveillance. If symptomatic, please notify  clinic to be seen in Symptom Management Clinic sooner. Staying home and social distancing encouraged during covid-19 pandemic.    I discussed the assessment and treatment plan with the patient. The patient was provided an opportunity to ask questions and all were answered. The patient agreed with the plan and demonstrated an understanding of the instructions.   The patient was advised to call back or seek an in-person evaluation if the symptoms worsen or if the condition fails to improve as anticipated.   I provided 38 minutes of non face-to-face telephone visit time during this encounter, and > 50% was spent counseling as documented under my assessment & plan.   Beckey Rutter, DNP, AGNP-C [place] [facility] [phone] (work cell) [phone] (office)  CC:   PRIMARY CARE PROVIDER: Margo Common, PA

## 2018-08-14 NOTE — Progress Notes (Signed)
Survivorship Care Plan visit completed.  Treatment summary reviewed and given to patient.  ASCO answers booklet reviewed and given to patient.  CARE program and Cancer Transitions discussed with patient along with other resources cancer center offers to patients and caregivers.  Patient verbalized understanding.    

## 2018-08-15 DIAGNOSIS — C44629 Squamous cell carcinoma of skin of left upper limb, including shoulder: Secondary | ICD-10-CM | POA: Diagnosis not present

## 2018-08-29 DIAGNOSIS — C44622 Squamous cell carcinoma of skin of right upper limb, including shoulder: Secondary | ICD-10-CM | POA: Diagnosis not present

## 2018-08-31 ENCOUNTER — Ambulatory Visit: Payer: Self-pay

## 2018-08-31 ENCOUNTER — Telehealth: Payer: Self-pay | Admitting: *Deleted

## 2018-08-31 ENCOUNTER — Inpatient Hospital Stay: Payer: Medicare HMO

## 2018-08-31 NOTE — Telephone Encounter (Signed)
Called patient and left message that due to coronavirus that we are cancelling b12 inj. Unless it is attached to another reason why patient is coming like a md appt or labs or port flush. I asked her to call me if she has questions and to confirm that she got my message

## 2018-09-12 DIAGNOSIS — C44722 Squamous cell carcinoma of skin of right lower limb, including hip: Secondary | ICD-10-CM | POA: Diagnosis not present

## 2018-09-12 DIAGNOSIS — C44729 Squamous cell carcinoma of skin of left lower limb, including hip: Secondary | ICD-10-CM | POA: Diagnosis not present

## 2018-09-26 DIAGNOSIS — C44729 Squamous cell carcinoma of skin of left lower limb, including hip: Secondary | ICD-10-CM | POA: Diagnosis not present

## 2018-09-28 ENCOUNTER — Inpatient Hospital Stay: Payer: Medicare HMO | Attending: Oncology

## 2018-09-28 ENCOUNTER — Other Ambulatory Visit: Payer: Self-pay

## 2018-09-28 ENCOUNTER — Inpatient Hospital Stay: Payer: Medicare HMO

## 2018-09-28 DIAGNOSIS — Z95828 Presence of other vascular implants and grafts: Secondary | ICD-10-CM

## 2018-09-28 DIAGNOSIS — C50511 Malignant neoplasm of lower-outer quadrant of right female breast: Secondary | ICD-10-CM | POA: Diagnosis not present

## 2018-09-28 DIAGNOSIS — Z452 Encounter for adjustment and management of vascular access device: Secondary | ICD-10-CM | POA: Diagnosis not present

## 2018-09-28 DIAGNOSIS — Z171 Estrogen receptor negative status [ER-]: Secondary | ICD-10-CM | POA: Diagnosis not present

## 2018-09-28 DIAGNOSIS — C7971 Secondary malignant neoplasm of right adrenal gland: Secondary | ICD-10-CM | POA: Diagnosis not present

## 2018-09-28 DIAGNOSIS — Z923 Personal history of irradiation: Secondary | ICD-10-CM | POA: Insufficient documentation

## 2018-09-28 DIAGNOSIS — E538 Deficiency of other specified B group vitamins: Secondary | ICD-10-CM | POA: Insufficient documentation

## 2018-09-28 DIAGNOSIS — Z9221 Personal history of antineoplastic chemotherapy: Secondary | ICD-10-CM | POA: Insufficient documentation

## 2018-09-28 DIAGNOSIS — C7951 Secondary malignant neoplasm of bone: Secondary | ICD-10-CM | POA: Diagnosis not present

## 2018-09-28 MED ORDER — SODIUM CHLORIDE 0.9% FLUSH
10.0000 mL | Freq: Once | INTRAVENOUS | Status: AC
  Administered 2018-09-28: 10 mL via INTRAVENOUS
  Filled 2018-09-28: qty 10

## 2018-09-28 MED ORDER — CYANOCOBALAMIN 1000 MCG/ML IJ SOLN
1000.0000 ug | INTRAMUSCULAR | Status: AC
  Administered 2018-09-28: 10:00:00 1000 ug via INTRAMUSCULAR

## 2018-09-28 MED ORDER — HEPARIN SOD (PORK) LOCK FLUSH 100 UNIT/ML IV SOLN
500.0000 [IU] | Freq: Once | INTRAVENOUS | Status: AC
  Administered 2018-09-28: 500 [IU] via INTRAVENOUS

## 2018-10-10 DIAGNOSIS — C44319 Basal cell carcinoma of skin of other parts of face: Secondary | ICD-10-CM | POA: Diagnosis not present

## 2018-10-12 ENCOUNTER — Other Ambulatory Visit: Payer: Self-pay | Admitting: Family Medicine

## 2018-10-12 NOTE — Telephone Encounter (Signed)
Please review

## 2018-10-17 DIAGNOSIS — D0461 Carcinoma in situ of skin of right upper limb, including shoulder: Secondary | ICD-10-CM | POA: Diagnosis not present

## 2018-10-17 DIAGNOSIS — D0471 Carcinoma in situ of skin of right lower limb, including hip: Secondary | ICD-10-CM | POA: Diagnosis not present

## 2018-10-17 DIAGNOSIS — C44529 Squamous cell carcinoma of skin of other part of trunk: Secondary | ICD-10-CM | POA: Diagnosis not present

## 2018-10-17 DIAGNOSIS — D0462 Carcinoma in situ of skin of left upper limb, including shoulder: Secondary | ICD-10-CM | POA: Diagnosis not present

## 2018-10-26 ENCOUNTER — Other Ambulatory Visit: Payer: Self-pay

## 2018-10-26 ENCOUNTER — Inpatient Hospital Stay: Payer: Medicare HMO | Attending: Oncology

## 2018-10-26 DIAGNOSIS — E538 Deficiency of other specified B group vitamins: Secondary | ICD-10-CM | POA: Insufficient documentation

## 2018-10-26 MED ORDER — CYANOCOBALAMIN 1000 MCG/ML IJ SOLN
1000.0000 ug | INTRAMUSCULAR | Status: DC
  Administered 2018-10-26: 1000 ug via INTRAMUSCULAR

## 2018-10-29 ENCOUNTER — Other Ambulatory Visit: Payer: Self-pay | Admitting: *Deleted

## 2018-10-29 DIAGNOSIS — Z171 Estrogen receptor negative status [ER-]: Secondary | ICD-10-CM

## 2018-10-29 DIAGNOSIS — C50511 Malignant neoplasm of lower-outer quadrant of right female breast: Secondary | ICD-10-CM

## 2018-10-30 ENCOUNTER — Telehealth: Payer: Self-pay

## 2018-10-30 NOTE — Telephone Encounter (Signed)
Patient called requesting refill on morphine because she has been hurting "all over ever since she finished chemo." Per Dr. Janese Banks, unable to refill morphine in the absense of active malignancy. Recommends that patient sees symptom management tomorrow for evaluation. Patient accepts appt for symptom management at 9am.   Pt also wanted to notify us that mammogram needs to be scheuled. Looking into epic orders Dr. Bary Castilla ordered mammogram so his office would  need to set up appt. patient notified and voiced understanding.

## 2018-10-31 ENCOUNTER — Ambulatory Visit
Admission: RE | Admit: 2018-10-31 | Discharge: 2018-10-31 | Disposition: A | Discharge status: Home / Self Care | Payer: Medicare HMO | Source: Ambulatory Visit | Attending: Oncology | Admitting: Oncology

## 2018-10-31 ENCOUNTER — Other Ambulatory Visit: Payer: Self-pay

## 2018-10-31 ENCOUNTER — Encounter: Payer: Self-pay | Admitting: Oncology

## 2018-10-31 ENCOUNTER — Inpatient Hospital Stay: Payer: Medicare HMO

## 2018-10-31 ENCOUNTER — Ambulatory Visit
Admission: RE | Admit: 2018-10-31 | Discharge: 2018-10-31 | Disposition: A | Discharge status: Home / Self Care | Payer: Medicare HMO | Attending: Oncology | Admitting: Oncology

## 2018-10-31 ENCOUNTER — Inpatient Hospital Stay: Payer: Medicare HMO | Attending: Oncology | Admitting: Oncology

## 2018-10-31 VITALS — BP 176/89 | HR 70 | Temp 96.9°F | Resp 18 | Wt 122.9 lb

## 2018-10-31 DIAGNOSIS — M1990 Unspecified osteoarthritis, unspecified site: Secondary | ICD-10-CM

## 2018-10-31 DIAGNOSIS — M79622 Pain in left upper arm: Secondary | ICD-10-CM | POA: Diagnosis not present

## 2018-10-31 DIAGNOSIS — Z7952 Long term (current) use of systemic steroids: Secondary | ICD-10-CM

## 2018-10-31 DIAGNOSIS — Z9221 Personal history of antineoplastic chemotherapy: Secondary | ICD-10-CM

## 2018-10-31 DIAGNOSIS — Z171 Estrogen receptor negative status [ER-]: Secondary | ICD-10-CM | POA: Insufficient documentation

## 2018-10-31 DIAGNOSIS — M2550 Pain in unspecified joint: Secondary | ICD-10-CM

## 2018-10-31 DIAGNOSIS — M25522 Pain in left elbow: Secondary | ICD-10-CM | POA: Diagnosis not present

## 2018-10-31 DIAGNOSIS — C7970 Secondary malignant neoplasm of unspecified adrenal gland: Secondary | ICD-10-CM | POA: Insufficient documentation

## 2018-10-31 DIAGNOSIS — Z87891 Personal history of nicotine dependence: Secondary | ICD-10-CM | POA: Insufficient documentation

## 2018-10-31 DIAGNOSIS — R0609 Other forms of dyspnea: Secondary | ICD-10-CM | POA: Insufficient documentation

## 2018-10-31 DIAGNOSIS — R059 Cough, unspecified: Secondary | ICD-10-CM

## 2018-10-31 DIAGNOSIS — M791 Myalgia, unspecified site: Secondary | ICD-10-CM

## 2018-10-31 DIAGNOSIS — C50511 Malignant neoplasm of lower-outer quadrant of right female breast: Secondary | ICD-10-CM

## 2018-10-31 DIAGNOSIS — R05 Cough: Secondary | ICD-10-CM

## 2018-10-31 DIAGNOSIS — R634 Abnormal weight loss: Secondary | ICD-10-CM | POA: Diagnosis not present

## 2018-10-31 DIAGNOSIS — Z85828 Personal history of other malignant neoplasm of skin: Secondary | ICD-10-CM | POA: Diagnosis not present

## 2018-10-31 DIAGNOSIS — K219 Gastro-esophageal reflux disease without esophagitis: Secondary | ICD-10-CM | POA: Diagnosis not present

## 2018-10-31 DIAGNOSIS — C801 Malignant (primary) neoplasm, unspecified: Secondary | ICD-10-CM | POA: Insufficient documentation

## 2018-10-31 DIAGNOSIS — M79602 Pain in left arm: Secondary | ICD-10-CM | POA: Diagnosis not present

## 2018-10-31 DIAGNOSIS — R5381 Other malaise: Secondary | ICD-10-CM | POA: Diagnosis not present

## 2018-10-31 DIAGNOSIS — E860 Dehydration: Secondary | ICD-10-CM

## 2018-10-31 DIAGNOSIS — Z923 Personal history of irradiation: Secondary | ICD-10-CM | POA: Diagnosis not present

## 2018-10-31 DIAGNOSIS — E538 Deficiency of other specified B group vitamins: Secondary | ICD-10-CM | POA: Diagnosis not present

## 2018-10-31 DIAGNOSIS — R5383 Other fatigue: Secondary | ICD-10-CM | POA: Diagnosis not present

## 2018-10-31 DIAGNOSIS — Z79899 Other long term (current) drug therapy: Secondary | ICD-10-CM | POA: Insufficient documentation

## 2018-10-31 DIAGNOSIS — M25512 Pain in left shoulder: Secondary | ICD-10-CM | POA: Insufficient documentation

## 2018-10-31 DIAGNOSIS — Z8521 Personal history of malignant neoplasm of larynx: Secondary | ICD-10-CM | POA: Insufficient documentation

## 2018-10-31 DIAGNOSIS — R918 Other nonspecific abnormal finding of lung field: Secondary | ICD-10-CM | POA: Diagnosis not present

## 2018-10-31 DIAGNOSIS — M549 Dorsalgia, unspecified: Secondary | ICD-10-CM

## 2018-10-31 DIAGNOSIS — Z95828 Presence of other vascular implants and grafts: Secondary | ICD-10-CM

## 2018-10-31 LAB — COMPREHENSIVE METABOLIC PANEL
ALT: 11 U/L (ref 0–44)
AST: 18 U/L (ref 15–41)
Albumin: 4.3 g/dL (ref 3.5–5.0)
Alkaline Phosphatase: 63 U/L (ref 38–126)
Anion gap: 11 (ref 5–15)
BUN: 12 mg/dL (ref 8–23)
CO2: 25 mmol/L (ref 22–32)
Calcium: 9.3 mg/dL (ref 8.9–10.3)
Chloride: 105 mmol/L (ref 98–111)
Creatinine, Ser: 0.68 mg/dL (ref 0.44–1.00)
GFR calc Af Amer: >60 mL/min (ref >60)
GFR calc non Af Amer: >60 mL/min (ref >60)
Glucose, Bld: 97 mg/dL (ref 70–99)
Potassium: 4.3 mmol/L (ref 3.5–5.1)
Sodium: 141 mmol/L (ref 135–145)
Total Bilirubin: 0.7 mg/dL (ref 0.3–1.2)
Total Protein: 7 g/dL (ref 6.5–8.1)

## 2018-10-31 LAB — CBC WITH DIFFERENTIAL/PLATELET
Abs Immature Granulocytes: 0.01 10*3/uL (ref 0.00–0.07)
Basophils Absolute: 0 10*3/uL (ref 0.0–0.1)
Basophils Relative: 0 %
Eosinophils Absolute: 0.2 10*3/uL (ref 0.0–0.5)
Eosinophils Relative: 5 %
HCT: 41.6 % (ref 36.0–46.0)
Hemoglobin: 13.6 g/dL (ref 12.0–15.0)
Immature Granulocytes: 0 %
Lymphocytes Relative: 17 %
Lymphs Abs: 0.8 10*3/uL (ref 0.7–4.0)
MCH: 31.3 pg (ref 26.0–34.0)
MCHC: 32.7 g/dL (ref 30.0–36.0)
MCV: 95.6 fL (ref 80.0–100.0)
Monocytes Absolute: 0.4 10*3/uL (ref 0.1–1.0)
Monocytes Relative: 8 %
Neutro Abs: 3.5 10*3/uL (ref 1.7–7.7)
Neutrophils Relative %: 70 %
Platelets: 171 10*3/uL (ref 150–400)
RBC: 4.35 MIL/uL (ref 3.87–5.11)
RDW: 12.6 % (ref 11.5–15.5)
WBC: 5 10*3/uL (ref 4.0–10.5)
nRBC: 0 % (ref 0.0–0.2)

## 2018-10-31 LAB — URINALYSIS, COMPLETE (UACMP) WITH MICROSCOPIC
Bacteria, UA: NONE SEEN
Bilirubin Urine: NEGATIVE
Glucose, UA: NEGATIVE mg/dL
Hgb urine dipstick: NEGATIVE
Ketones, ur: NEGATIVE mg/dL
Leukocytes,Ua: NEGATIVE
Nitrite: NEGATIVE
Protein, ur: NEGATIVE mg/dL
Specific Gravity, Urine: 1.005 (ref 1.005–1.030)
Squamous Epithelial / HPF: NONE SEEN (ref 0–5)
pH: 7 (ref 5.0–8.0)

## 2018-10-31 LAB — TSH: TSH: 0.414 u[IU]/mL (ref 0.350–4.500)

## 2018-10-31 MED ORDER — HYDROCODONE-ACETAMINOPHEN 5-325 MG PO TABS
2.0000 | ORAL_TABLET | Freq: Once | ORAL | Status: AC
  Administered 2018-10-31: 2 via ORAL
  Filled 2018-10-31: qty 2

## 2018-10-31 MED ORDER — PREDNISONE 20 MG PO TABS: 40.0000 mg | ORAL_TABLET | Freq: Every day | ORAL | 0 refills | Status: DC

## 2018-10-31 MED ORDER — DEXAMETHASONE SODIUM PHOSPHATE 10 MG/ML IJ SOLN
10.0000 mg | Freq: Once | INTRAMUSCULAR | Status: AC
  Administered 2018-10-31: 10 mg via INTRAVENOUS
  Filled 2018-10-31: qty 1

## 2018-10-31 MED ORDER — SODIUM CHLORIDE 0.9% FLUSH
10.0000 mL | INTRAVENOUS | Status: DC | PRN
  Administered 2018-10-31: 10 mL via INTRAVENOUS
  Filled 2018-10-31: qty 10

## 2018-10-31 MED ORDER — SODIUM CHLORIDE 0.9 % IV SOLN
INTRAVENOUS | Status: DC
  Administered 2018-10-31: 11:00:00 via INTRAVENOUS
  Filled 2018-10-31 (×2): qty 250

## 2018-10-31 MED ORDER — HEPARIN SOD (PORK) LOCK FLUSH 100 UNIT/ML IV SOLN
500.0000 [IU] | Freq: Once | INTRAVENOUS | Status: AC
  Administered 2018-10-31: 500 [IU] via INTRAVENOUS

## 2018-10-31 NOTE — Progress Notes (Signed)
Symptom Management Consult note [facility]  [phone] [phone]  Patient Care Team: Chrismon, Vickki Muff, PA as PCP - General (Physician Assistant) Rico Junker, RN as Oncology Nurse Navigator Sindy Guadeloupe, MD as Consulting Physician (Oncology) Bary Castilla Forest Gleason, MD (General Surgery)   Name of the patient: Denise Watts  [id number]  [birth year]   Date of visit: 10/31/2018   Diagnosis- Recurrent Right breast cancer- NED  Chief complaint/ Reason for visit- Pain all over   Heme/Onc history:  Oncology History Overview Note  1. Carcinoma of breast, T1, N0, M0 tumor diagnosis.  In January of 1997 2. Carcinoma of the vocal cord T1, N0, M0 tumor.  Status postradiation therapy 3. Abnormal CT scan of the chest (December, 2015) 4. Repeat CT scan of chest shows stable nodule (July, 2016) 5. Neutropenia with normal hemoglobin and normal platelet count (July, 2016)   Cancer of vocal cord [facility])  11/10/2014 Initial Diagnosis   Cancer of vocal cord   Malignant neoplasm of lower-outer quadrant of right breast of female, estrogen receptor negative ([place])  11/24/2017 Initial Diagnosis   Malignant neoplasm of lower-outer quadrant of right breast of female, estrogen receptor negative ([place])   01/12/2018 Cancer Staging   Staging form: Breast, AJCC 8th Edition - Clinical stage from 01/12/2018: Stage IB (cT1a, cN0, cM0, G3, ER+, PR-, HER2-) - Signed by Sindy Guadeloupe, MD on 01/12/2018   01/30/2018 -  Chemotherapy   The patient had palonosetron (ALOXI) injection 0.25 mg, 0.25 mg, Intravenous,  Once, 4 of 4 cycles Administration: 0.25 mg (01/30/2018), 0.25 mg (02/27/2018), 0.25 mg (03/28/2018), 0.25 mg (04/26/2018) pegfilgrastim (NEULASTA ONPRO KIT) injection 6 mg, 6 mg, Subcutaneous, Once, 4 of 4 cycles Administration: 6 mg (01/30/2018), 6 mg (02/27/2018), 6 mg (03/28/2018), 6 mg (04/26/2018) cyclophosphamide (CYTOXAN) 1,000 mg in sodium chloride 0.9 %  250 mL chemo infusion, 600 mg/m2 = 1,000 mg, Intravenous,  Once, 4 of 4 cycles Administration: 1,000 mg (01/30/2018), 1,000 mg (02/27/2018), 1,000 mg (03/28/2018), 1,000 mg (04/26/2018) DOCEtaxel (TAXOTERE) 130 mg in sodium chloride 0.9 % 250 mL chemo infusion, 75 mg/m2 = 130 mg, Intravenous,  Once, 4 of 4 cycles Dose modification: 60 mg/m2 (original dose 75 mg/m2, Cycle 2, Reason: Dose not tolerated) Administration: 130 mg (01/30/2018), 100 mg (02/27/2018), 100 mg (03/28/2018), 100 mg (04/26/2018)  for chemotherapy treatment.       Interval history-patient presents to clinic for complaints of "hurting all over since finishing chemo".  Patient received 4 cycles of Cytoxan and Taxotere completing in December 2019.  She has received monthly B12 injections for anemia B12 deficiency.  She was evaluated last by Dr. Janese Banks on 06/11/2018 to review CT scans.  CT scans revealed stable lung nodules and no findings concerning for metastatic disease.  She was scheduled to return to clinic in 6 months for evaluation and imaging.  Today, patient states she has all over body achiness and pain that began with the start of chemotherapy in October 2019 that has waxed and waned.  Over the past 2 weeks she has noticed more constant myalgias resulting in decrease in her mobility and ADLs.  Previously able to walk her dog 1 mile daily, has become much more difficult and painful due to hip and knee pains.  When asked area is most concerning patient responded with pain to her left humerus and intermittent sharp stabbing pain to right breast. She denies injury to either.  States she does mow her grass with a  riding lawnmower and has been hit with a tree branch near/around that arm.  She denies any falls.  She has taken Tylenol and ibuprofen without relief.  She was prescribed liquid morphine for mucositis/stomatitis back in November 2019 while receiving chemotherapy and has been using that sparingly with some relief.  She is almost out  prompting her visit.  She has not attempted to contact her PCP because she assumed this was residual "pain" from chemotherapy.  She denies any recent fevers or illnesses, easy bleeding or bruising, chest pain, nausea or vomiting, constipation or diarrhea.  Her appetite is "okay".   ECOG FS:1 - Symptomatic but completely ambulatory  Review of systems- Review of Systems  Constitutional: Positive for malaise/fatigue and weight loss. Negative for chills and fever.  HENT: Negative for congestion, ear pain and tinnitus.   Eyes: Negative.  Negative for blurred vision and double vision.  Respiratory: Positive for cough, sputum production and shortness of breath (With exertion).   Cardiovascular: Negative.  Negative for chest pain, palpitations and leg swelling.  Gastrointestinal: Negative.  Negative for abdominal pain, constipation, diarrhea, nausea and vomiting.  Genitourinary: Negative for dysuria, frequency and urgency.  Musculoskeletal: Positive for back pain, joint pain and myalgias. Negative for falls.  Skin: Negative.  Negative for rash.  Neurological: Positive for weakness. Negative for headaches.  Endo/Heme/Allergies: Negative.  Does not bruise/bleed easily.  Psychiatric/Behavioral: Negative.  Negative for depression. The patient is not nervous/anxious and does not have insomnia.      Current treatment-status post 4 cycles of Taxotere/Cytoxan.  Completing in December 2019.  Receiving monthly B12 injections.  No Known Allergies   Past Medical History:  Diagnosis Date   Arthritis    KNEE RIGHT   Breast cancer ([place]) 1997   right breast lumpectomy with radiation   Breast cancer ([place]) 11/15/2017   INVASIVE MAMMARY CARCINOMA/ triple negative   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[facility]) 11/10/2014   Cough 03/02/2017   INTERMTTENT DRY COUGH   GERD (gastroesophageal reflux disease)    History of hiatal hernia    Personal history of radiation therapy    Skin cancer    Throat cancer ([place]) 1998     radiation     Past Surgical History:  Procedure Laterality Date   BREAST BIOPSY Right 1997   positive   BREAST BIOPSY Right 11/15/2017   INVASIVE MAMMARY CARCINOMA triple negative   BREAST LUMPECTOMY Right 1997   BREAST LUMPECTOMY WITH SENTINEL LYMPH NODE BIOPSY Right 12/08/2017   Procedure: BREAST LUMPECTOMY WITH SENTINEL LYMPH NODE BX;  Surgeon: Robert Bellow, MD;  Location: [facility];  Service: General;  Laterality: Right;   COLONOSCOPY  2015   ESOPHAGUS SURGERY     KNEE SURGERY Right    PORTACATH PLACEMENT Right 01/17/2018   Procedure: INSERTION PORT-A-CATH- RIGHT;  Surgeon: Robert Bellow, MD;  Location: [facility];  Service: General;  Laterality: Right;   ROBOTIC ADRENALECTOMY Right 12/28/2016   Procedure: ROBOTIC ADRENALECTOMY;  Surgeon: Hollice Espy, MD;  Location: [facility];  Service: Urology;  Laterality: Right;   SKIN CANCER EXCISION     THROAT SURGERY  1998   throat cancer    TUBAL LIGATION     VENTRAL HERNIA REPAIR N/A 03/03/2017   Procedure: HERNIA REPAIR VENTRAL ADULT;  Surgeon: Clayburn Pert, MD;  Location: [facility];  Service: General;  Laterality: N/A;    Social History   Socioeconomic History   Marital status: Married    Spouse name: Not on file  Number of children: Not on file   Years of education: Not on file   Highest education level: Not on file  Occupational History   Not on file  Social Needs   Financial resource strain: Not on file   Food insecurity    Worry: Not on file    Inability: Not on file   Transportation needs    Medical: Not on file    Non-medical: Not on file  Tobacco Use   Smoking status: Former Smoker    Packs/day: 1.50    Years: 30.00    Pack years: 45.00    Types: Cigarettes    Quit date: 05/02/1996    Years since quitting: 22.5   Smokeless tobacco: Never Used  Substance and Sexual Activity   Alcohol use: Yes    Alcohol/week: 0.0 standard drinks    Comment: OCC   Drug use: No   Sexual  activity: Not on file  Lifestyle   Physical activity    Days per week: Not on file    Minutes per session: Not on file   Stress: Not on file  Relationships   Social connections    Talks on phone: Not on file    Gets together: Not on file    Attends religious service: Not on file    Active member of club or organization: Not on file    Attends meetings of clubs or organizations: Not on file    Relationship status: Not on file   Intimate partner violence    Fear of current or ex partner: Not on file    Emotionally abused: Not on file    Physically abused: Not on file    Forced sexual activity: Not on file  Other Topics Concern   Not on file  Social History Narrative   Not on file    Family History  Problem Relation Age of Onset   Diabetes Mother    Heart attack Mother    Cancer Sister        cancer of eyes, spread to lung, other places. died at 59   Cancer Sister 33       unknown primary, spread to bone, brain died in 57's   Cancer Paternal Aunt        unk type   Cancer Paternal Grandmother        type unk   Prostate cancer Neg Hx    Kidney cancer Neg Hx    Bladder Cancer Neg Hx      Current Outpatient Medications:    Cyanocobalamin (B-12 COMPLIANCE INJECTION IJ), Inject 1 Dose as directed every 30 (thirty) days., Disp: , Rfl:    morphine (ROXANOL) 20 MG/ML concentrated solution, Take 0.5 mLs (10 mg total) by mouth every 3 (three) hours as needed for severe pain., Disp: 240 mL, Rfl: 0   naproxen sodium (ALEVE) 220 MG tablet, Take 220 mg by mouth daily as needed (pain). , Disp: , Rfl:    omeprazole (PRILOSEC) 40 MG capsule, TAKE 1 CAPSULE BY MOUTH EVERY DAY (MORNING), Disp: 30 capsule, Rfl: 11   hydrocortisone cream 0.5 %, Apply 1 application topically 2 (two) times daily. (Patient not taking: Reported on 10/31/2018), Disp: 30 g, Rfl: 0   lidocaine-prilocaine (EMLA) cream, Apply 1 application topically as needed. (Patient not taking: Reported on  10/31/2018), Disp: 30 g, Rfl: 0   predniSONE (DELTASONE) 20 MG tablet, Take 2 tablets (40 mg total) by mouth daily with breakfast., Disp: 14 tablet, Rfl: 0 No  current facility-administered medications for this visit.   Facility-Administered Medications Ordered in Other Visits:    cyanocobalamin ((VITAMIN B-12)) injection 1,000 mcg, 1,000 mcg, Intramuscular, Q30 days, Sindy Guadeloupe, MD, 1,000 mcg at 08/03/18 9357   cyanocobalamin ((VITAMIN B-12)) injection 1,000 mcg, 1,000 mcg, Intramuscular, Q30 days, Sindy Guadeloupe, MD, 1,000 mcg at 09/28/18 1013  Physical exam:  Vitals:   10/31/18 0948  BP: (!) 176/89  Pulse: 70  Resp: 18  Temp: (!) 96.9 F (36.1 C)  Weight: 122 lb 14.4 oz (55.7 kg)   Physical Exam Vitals signs reviewed.  Constitutional:      Appearance: Normal appearance.  HENT:     Head: Normocephalic and atraumatic.  Eyes:     Pupils: Pupils are equal, round, and reactive to light.  Neck:     Musculoskeletal: Normal range of motion.  Cardiovascular:     Rate and Rhythm: Normal rate and regular rhythm.     Heart sounds: Normal heart sounds. No murmur.  Pulmonary:     Effort: Pulmonary effort is normal.     Breath sounds: Normal breath sounds. No wheezing.  Abdominal:     General: Bowel sounds are normal. There is no distension.     Palpations: Abdomen is soft.     Tenderness: There is no abdominal tenderness.  Musculoskeletal: Normal range of motion.     Left upper arm: She exhibits tenderness.       Arms:  Skin:    General: Skin is warm and dry.     Findings: No rash.  Neurological:     Mental Status: She is alert and oriented to person, place, and time.  Psychiatric:        Judgment: Judgment normal.      CMP Latest Ref Rng & Units 10/31/2018  Glucose 70 - 99 mg/dL 97  BUN 8 - 23 mg/dL 12  Creatinine 0.44 - 1.00 mg/dL 0.68  Sodium 135 - 145 mmol/L 141  Potassium 3.5 - 5.1 mmol/L 4.3  Chloride 98 - 111 mmol/L 105  CO2 22 - 32 mmol/L 25  Calcium 8.9 -  10.3 mg/dL 9.3  Total Protein 6.5 - 8.1 g/dL 7.0  Total Bilirubin 0.3 - 1.2 mg/dL 0.7  Alkaline Phos 38 - 126 U/L 63  AST 15 - 41 U/L 18  ALT 0 - 44 U/L 11   CBC Latest Ref Rng & Units 10/31/2018  WBC 4.0 - 10.5 K/uL 5.0  Hemoglobin 12.0 - 15.0 g/dL 13.6  Hematocrit 36.0 - 46.0 % 41.6  Platelets 150 - 400 K/uL 171    No images are attached to the encounter.  Dg Chest 2 View  Result Date: 10/31/2018 CLINICAL DATA:  Right sided breast cancer x 2, lumpectomy the first time and partial mastectomy the second time, Port-a-cath, history of right lung spot that has been followed for years, mild cough and SOB with exertion more recently, former smoker who smoked for 30+ years, denies asthma, Left arm pain for months from left shoulder to left elbow, decreased range of motion and pain is worse with movement, pain has worsened over the last month, no known injury, shielded EXAM: CHEST - 2 VIEW COMPARISON:  11/06/2017 FINDINGS: There is a right-sided Port-A-Cath in satisfactory position. There is a stable 15 mm right lower lobe pulmonary nodule in the superior segment. There is no focal consolidation. There is no pleural effusion or pneumothorax. The heart and mediastinal contours are unremarkable. There is no acute osseous abnormality. IMPRESSION: No active  cardiopulmonary disease. Stable 15 mm right lower lobe pulmonary nodule in the superior segment. Electronically Signed   By: Kathreen Devoid   On: 10/31/2018 13:20   Dg Elbow Complete Left (3+view)  Result Date: 10/31/2018 CLINICAL DATA:  Left elbow pain without known injury. EXAM: LEFT ELBOW - COMPLETE 3+ VIEW COMPARISON:  None. FINDINGS: There is no evidence of fracture, dislocation, or joint effusion. There is no evidence of arthropathy or other focal bone abnormality. Soft tissues are unremarkable. IMPRESSION: Negative. Electronically Signed   By: Marijo Conception M.D.   On: 10/31/2018 13:19   Dg Humerus Left  Result Date: 10/31/2018 CLINICAL DATA:  Left  arm pain.  No known injury. EXAM: LEFT HUMERUS - 2+ VIEW COMPARISON:  CT scan of June 05, 2018. FINDINGS: There is no evidence of fracture or other focal bone lesions. Soft tissues are unremarkable. IMPRESSION: Normal left humerus. Left pulmonary nodule is noted concerning for metastatic disease. Electronically Signed   By: Marijo Conception M.D.   On: 10/31/2018 13:18     Assessment and plan- Patient is a 63 y.o. female who presents to symptom management for all over pain X 6 months; worsening over the past 2 weeks.  Has history of recurrent right breast cancer: Initial diagnosis of right breast cancer in 1997.  Status post surgery and radiation.  No chemotherapy or hormone therapy.  New 4.5 mm nodule in the right breast found incidentally from CT chest and abdomen/pelvis for follow-up of pulmonary nodules.  Ultrasound and mammogram with core biopsy revealed grade 3 triple negative breast cancer.  Had lumpectomy by Dr. Tollie Pizza on 12/08/2017.  She is status post 4 cycles of TC chemotherapy completing in December 2019.  Most recent imaging from February 2020 revealed stable pulmonary nodules and no evidence of active malignancy.  She is scheduled for follow-up with Dr. Janese Banks at the end of the month.   Vocal cord cancer: Status post surgery and radiation.  Completed in 1998.  Stable lung nodules: Has history of smoking.  Under surveillance imaging with CT scans of the chest yearly.  Most recent CT scan reveals continued stability.   Adrenal mass with peripheral hypermetabolism: Found on CT chest which prompted a PET scan in July 2018.  It was suspicious for isolated mets or primary adrenal neoplasm.  Biopsy revealed high-grade carcinoma.  Had adrenalectomy in August 2018.  Under active surveillance due to unclear etiology of tumor.  B12 deficiency/fatigue: Monthly B12 injections.  Hemoglobin stable.   Myalgias: Unclear etiology.  Educated patient that this is unlikely related to chemotherapy from December  2019.  Given patient has 3 separate primaries, would recommend to move up scheduled CT scans for evaluation.  If negative, would recommend referral to rheumatology.  We will start her on prednisone see if improvement is noted.  Left humerus pain: On assessment, very tender to touch.  No mass identified.  Will get dedicated left arm x-ray to R/O fracture.   Plan: Stat labs. ANA pending. All others are normal.  UA/urine culture. Negative.  Stat chest x-ray. Negative.  Stat left humerus and elbow x-ray. Negative. RX prednisone 40 mg daily X 7 days. Referral to endocrinology ASAP. 1 L NaCl while in clinic.  10 mg Decadron while in clinic. 5-332m Norco X 2 in clinic for pain.   Disposition: Will move up CT abdomen/pelvis. RTC as scheduled for monthly B12 injections. RTC as scheduled for MD evaluation after scans.   1. Cough   2. Left arm  pain   3. Myalgia     Patient expressed understanding and was in agreement with this plan. She also understands that She can call clinic at any time with any questions, concerns, or complaints.   Greater than 50% was spent in counseling and coordination of care with this patient including but not limited to discussion of the relevant topics above (See A&P) including, but not limited to diagnosis and management of acute and chronic medical conditions.   Thank you for allowing me to participate in the care of this very pleasant patient.    Jacquelin Hawking, NP [facility] Cell - [phone] Pager- [phone] 11/01/2018 9:23 AM

## 2018-11-01 ENCOUNTER — Other Ambulatory Visit: Payer: Self-pay | Admitting: Oncology

## 2018-11-01 LAB — URINE CULTURE: Culture: <10000 — AB

## 2018-11-01 LAB — ANA COMPREHENSIVE PANEL
Anti JO-1: <0.2 AI (ref 0.0–0.9)
Centromere Ab Screen: <0.2 AI (ref 0.0–0.9)
Chromatin Ab SerPl-aCnc: <0.2 AI (ref 0.0–0.9)
ENA SM Ab Ser-aCnc: <0.2 AI (ref 0.0–0.9)
Ribonucleic Protein: 0.4 AI (ref 0.0–0.9)
SSA (Ro) (ENA) Antibody, IgG: <0.2 AI (ref 0.0–0.9)
SSB (La) (ENA) Antibody, IgG: <0.2 AI (ref 0.0–0.9)
Scleroderma (Scl-70) (ENA) Antibody, IgG: <0.2 AI (ref 0.0–0.9)
ds DNA Ab: 1 IU/mL (ref 0–9)

## 2018-11-08 ENCOUNTER — Ambulatory Visit
Admission: RE | Admit: 2018-11-08 | Discharge: 2018-11-08 | Disposition: A | Discharge status: Home / Self Care | Payer: Medicare HMO | Source: Ambulatory Visit | Attending: Oncology | Admitting: Oncology

## 2018-11-08 ENCOUNTER — Other Ambulatory Visit: Payer: Self-pay

## 2018-11-08 DIAGNOSIS — Z171 Estrogen receptor negative status [ER-]: Secondary | ICD-10-CM | POA: Insufficient documentation

## 2018-11-08 DIAGNOSIS — R918 Other nonspecific abnormal finding of lung field: Secondary | ICD-10-CM | POA: Diagnosis not present

## 2018-11-08 DIAGNOSIS — C50511 Malignant neoplasm of lower-outer quadrant of right female breast: Secondary | ICD-10-CM | POA: Insufficient documentation

## 2018-11-08 DIAGNOSIS — C50919 Malignant neoplasm of unspecified site of unspecified female breast: Secondary | ICD-10-CM | POA: Diagnosis not present

## 2018-11-08 MED ORDER — IOHEXOL 300 MG/ML  SOLN
100.0000 mL | Freq: Once | INTRAMUSCULAR | Status: AC | PRN
  Administered 2018-11-08: 100 mL via INTRAVENOUS

## 2018-11-12 ENCOUNTER — Other Ambulatory Visit: Payer: Self-pay | Admitting: *Deleted

## 2018-11-12 ENCOUNTER — Telehealth: Payer: Self-pay | Admitting: *Deleted

## 2018-11-12 DIAGNOSIS — M791 Myalgia, unspecified site: Secondary | ICD-10-CM

## 2018-11-12 MED ORDER — MORPHINE SULFATE (CONCENTRATE) 20 MG/ML PO SOLN: 10.0000 mg | Freq: Four times a day (QID) | ORAL | 0 refills | Status: DC | PRN

## 2018-11-12 NOTE — Telephone Encounter (Signed)
Pt. Called to say that she had left message with Lowella Dell about her pain. She wanted pain refill and I told her that Sonia Baller was not here today but I could ask dr. Janese Banks. The patient states that she has pain all over and no one has figured out what is causing it. Sonia Baller had told her that when scans are back if there is no cancer issues then she would send patient to rheumatology and then dr Janese Banks states that we can send to pain management. She will refill pain med for 1 week. And pt. Agreeable to the referrals and then pain med.

## 2018-11-21 DIAGNOSIS — M7552 Bursitis of left shoulder: Secondary | ICD-10-CM | POA: Insufficient documentation

## 2018-11-21 DIAGNOSIS — M25512 Pain in left shoulder: Secondary | ICD-10-CM | POA: Diagnosis not present

## 2018-11-21 DIAGNOSIS — G894 Chronic pain syndrome: Secondary | ICD-10-CM | POA: Diagnosis not present

## 2018-11-21 DIAGNOSIS — M19012 Primary osteoarthritis, left shoulder: Secondary | ICD-10-CM | POA: Diagnosis not present

## 2018-11-21 DIAGNOSIS — E559 Vitamin D deficiency, unspecified: Secondary | ICD-10-CM | POA: Diagnosis not present

## 2018-11-21 DIAGNOSIS — M2550 Pain in unspecified joint: Secondary | ICD-10-CM | POA: Diagnosis not present

## 2018-11-21 DIAGNOSIS — Z13820 Encounter for screening for osteoporosis: Secondary | ICD-10-CM | POA: Diagnosis not present

## 2018-11-21 DIAGNOSIS — M7582 Other shoulder lesions, left shoulder: Secondary | ICD-10-CM | POA: Insufficient documentation

## 2018-11-26 ENCOUNTER — Telehealth: Payer: Self-pay | Admitting: *Deleted

## 2018-11-26 NOTE — Telephone Encounter (Signed)
Pt wanted me to call her back about referrals I made for her. I have called her back and got her voicemail. I have left my direct number to call me back so that I can help her .

## 2018-11-27 ENCOUNTER — Other Ambulatory Visit: Payer: Self-pay

## 2018-11-27 ENCOUNTER — Telehealth: Payer: Self-pay | Admitting: *Deleted

## 2018-11-27 NOTE — Telephone Encounter (Signed)
Pt thought she did not need port labs done . She knows she has appt with dr Janese Banks on Friday of this week. When I reviewed the chart she had port labs and flush 7/1. She had ct 7/9 and she does not need anything but see md 7/31 and get b12 shot

## 2018-11-28 ENCOUNTER — Inpatient Hospital Stay: Payer: Medicare HMO

## 2018-11-28 ENCOUNTER — Ambulatory Visit: Payer: PRIVATE HEALTH INSURANCE

## 2018-11-29 ENCOUNTER — Other Ambulatory Visit: Payer: Self-pay

## 2018-11-30 ENCOUNTER — Inpatient Hospital Stay: Payer: Medicare HMO

## 2018-11-30 ENCOUNTER — Inpatient Hospital Stay (HOSPITAL_BASED_OUTPATIENT_CLINIC_OR_DEPARTMENT_OTHER): Payer: Medicare HMO | Admitting: Oncology

## 2018-11-30 ENCOUNTER — Encounter: Payer: Self-pay | Admitting: Oncology

## 2018-11-30 ENCOUNTER — Other Ambulatory Visit: Payer: Self-pay

## 2018-11-30 VITALS — BP 130/80 | HR 76 | Temp 96.0°F | Resp 18 | Ht 61.0 in | Wt 117.0 lb

## 2018-11-30 DIAGNOSIS — R918 Other nonspecific abnormal finding of lung field: Secondary | ICD-10-CM

## 2018-11-30 DIAGNOSIS — Z923 Personal history of irradiation: Secondary | ICD-10-CM | POA: Diagnosis not present

## 2018-11-30 DIAGNOSIS — C7970 Secondary malignant neoplasm of unspecified adrenal gland: Secondary | ICD-10-CM | POA: Diagnosis not present

## 2018-11-30 DIAGNOSIS — C50511 Malignant neoplasm of lower-outer quadrant of right female breast: Secondary | ICD-10-CM

## 2018-11-30 DIAGNOSIS — Z79899 Other long term (current) drug therapy: Secondary | ICD-10-CM

## 2018-11-30 DIAGNOSIS — E538 Deficiency of other specified B group vitamins: Secondary | ICD-10-CM

## 2018-11-30 DIAGNOSIS — Z171 Estrogen receptor negative status [ER-]: Secondary | ICD-10-CM | POA: Diagnosis not present

## 2018-11-30 DIAGNOSIS — Z9221 Personal history of antineoplastic chemotherapy: Secondary | ICD-10-CM

## 2018-11-30 DIAGNOSIS — R634 Abnormal weight loss: Secondary | ICD-10-CM

## 2018-11-30 DIAGNOSIS — R5381 Other malaise: Secondary | ICD-10-CM | POA: Diagnosis not present

## 2018-11-30 DIAGNOSIS — Z08 Encounter for follow-up examination after completed treatment for malignant neoplasm: Secondary | ICD-10-CM

## 2018-11-30 DIAGNOSIS — M25512 Pain in left shoulder: Secondary | ICD-10-CM

## 2018-11-30 DIAGNOSIS — C801 Malignant (primary) neoplasm, unspecified: Secondary | ICD-10-CM | POA: Diagnosis not present

## 2018-11-30 MED ORDER — CYANOCOBALAMIN 1000 MCG/ML IJ SOLN
1000.0000 ug | INTRAMUSCULAR | Status: AC
  Administered 2018-11-30: 1000 ug via INTRAMUSCULAR
  Filled 2018-11-30: qty 1

## 2018-11-30 NOTE — Progress Notes (Signed)
Patient c/o whole body pain ( pain level today 6).

## 2018-12-02 NOTE — Progress Notes (Signed)
Hematology/Oncology Consult note [facility]  [phone] [phone]  Patient Care Team: Chrismon, Vickki Muff, PA as PCP - General (Physician Assistant) Rico Junker, RN as Oncology Nurse Navigator Sindy Guadeloupe, MD as Consulting Physician (Oncology) Bary Castilla Forest Gleason, MD (General Surgery)   Name of the patient: Denise Watts  [id number]  1956/01/06   Date of visit: 12/02/18  Diagnosis- 1. H/o breast cancer in 1997 2. H/o stage I SCC of vocal cord in 2015. 3. Lung nodules4. Adrenal carcinoma of unknown primary s/p resection5. Stage IaT1a NxcM0 ER weakly positive 1-10%, PR negative and her 2 negative  Chief complaint/ Reason for visit- discuss CT scan results and further management  Heme/Onc history:  Oncology History Overview Note  1. Carcinoma of breast, T1, N0, M0 tumor diagnosis.  In January of 1997 2. Carcinoma of the vocal cord T1, N0, M0 tumor.  Status postradiation therapy 3. Abnormal CT scan of the chest (December, 2015) 4. Repeat CT scan of chest shows stable nodule (July, 2016) 5. Neutropenia with normal hemoglobin and normal platelet count (July, 2016)   Cancer of vocal cord [facility])  11/10/2014 Initial Diagnosis   Cancer of vocal cord   Malignant neoplasm of lower-outer quadrant of right breast of female, estrogen receptor negative ([place])  11/24/2017 Initial Diagnosis   Malignant neoplasm of lower-outer quadrant of right breast of female, estrogen receptor negative ([place])   01/12/2018 Cancer Staging   Staging form: Breast, AJCC 8th Edition - Clinical stage from 01/12/2018: Stage IB (cT1a, cN0, cM0, G3, ER+, PR-, HER2-) - Signed by Sindy Guadeloupe, MD on 01/12/2018   01/30/2018 -  Chemotherapy   The patient had palonosetron (ALOXI) injection 0.25 mg, 0.25 mg, Intravenous,  Once, 4 of 4 cycles Administration: 0.25 mg (01/30/2018), 0.25 mg (02/27/2018), 0.25 mg (03/28/2018), 0.25 mg (04/26/2018) pegfilgrastim (NEULASTA ONPRO  KIT) injection 6 mg, 6 mg, Subcutaneous, Once, 4 of 4 cycles Administration: 6 mg (01/30/2018), 6 mg (02/27/2018), 6 mg (03/28/2018), 6 mg (04/26/2018) cyclophosphamide (CYTOXAN) 1,000 mg in sodium chloride 0.9 % 250 mL chemo infusion, 600 mg/m2 = 1,000 mg, Intravenous,  Once, 4 of 4 cycles Administration: 1,000 mg (01/30/2018), 1,000 mg (02/27/2018), 1,000 mg (03/28/2018), 1,000 mg (04/26/2018) DOCEtaxel (TAXOTERE) 130 mg in sodium chloride 0.9 % 250 mL chemo infusion, 75 mg/m2 = 130 mg, Intravenous,  Once, 4 of 4 cycles Dose modification: 60 mg/m2 (original dose 75 mg/m2, Cycle 2, Reason: Dose not tolerated) Administration: 130 mg (01/30/2018), 100 mg (02/27/2018), 100 mg (03/28/2018), 100 mg (04/26/2018)  for chemotherapy treatment.      Interval history- patient states that she hurts all over but mainly in her left shoulder and left arm. She saw rheumatology for this and underwent steroid injection but this has not helped her pain  ECOG PS- 2 Pain scale- 0   Review of systems- Review of Systems  Constitutional: Positive for malaise/fatigue. Negative for chills, fever and weight loss.  HENT: Negative for congestion, ear discharge and nosebleeds.   Eyes: Negative for blurred vision.  Respiratory: Negative for cough, hemoptysis, sputum production, shortness of breath and wheezing.   Cardiovascular: Negative for chest pain, palpitations, orthopnea and claudication.  Gastrointestinal: Negative for abdominal pain, blood in stool, constipation, diarrhea, heartburn, melena, nausea and vomiting.  Genitourinary: Negative for dysuria, flank pain, frequency, hematuria and urgency.  Musculoskeletal: Negative for back pain, joint pain and myalgias.  Skin: Negative for rash.  Neurological: Negative for dizziness, tingling, focal weakness, seizures, weakness and  headaches.  Endo/Heme/Allergies: Does not bruise/bleed easily.  Psychiatric/Behavioral: Negative for depression and suicidal ideas. The  patient does not have insomnia.        No Known Allergies   Past Medical History:  Diagnosis Date   Arthritis    KNEE RIGHT   Breast cancer ([place]) 1997   right breast lumpectomy with radiation   Breast cancer ([place]) 11/15/2017   INVASIVE MAMMARY CARCINOMA/ triple negative   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[facility]) 11/10/2014   Cough 03/02/2017   INTERMTTENT DRY COUGH   GERD (gastroesophageal reflux disease)    History of hiatal hernia    Personal history of radiation therapy    Skin cancer    Throat cancer ([place]) 1998   radiation     Past Surgical History:  Procedure Laterality Date   BREAST BIOPSY Right 1997   positive   BREAST BIOPSY Right 11/15/2017   INVASIVE MAMMARY CARCINOMA triple negative   BREAST LUMPECTOMY Right 1997   BREAST LUMPECTOMY WITH SENTINEL LYMPH NODE BIOPSY Right 12/08/2017   Procedure: BREAST LUMPECTOMY WITH SENTINEL LYMPH NODE BX;  Surgeon: Robert Bellow, MD;  Location: [facility];  Service: General;  Laterality: Right;   COLONOSCOPY  2015   ESOPHAGUS SURGERY     KNEE SURGERY Right    PORTACATH PLACEMENT Right 01/17/2018   Procedure: INSERTION PORT-A-CATH- RIGHT;  Surgeon: Robert Bellow, MD;  Location: [facility];  Service: General;  Laterality: Right;   ROBOTIC ADRENALECTOMY Right 12/28/2016   Procedure: ROBOTIC ADRENALECTOMY;  Surgeon: Hollice Espy, MD;  Location: [facility];  Service: Urology;  Laterality: Right;   SKIN CANCER EXCISION     THROAT SURGERY  1998   throat cancer    TUBAL LIGATION     VENTRAL HERNIA REPAIR N/A 03/03/2017   Procedure: HERNIA REPAIR VENTRAL ADULT;  Surgeon: Clayburn Pert, MD;  Location: [facility];  Service: General;  Laterality: N/A;    Social History   Socioeconomic History   Marital status: Married    Spouse name: Not on file   Number of children: Not on file   Years of education: Not on file   Highest education level: Not on file  Occupational History   Not on file  Social Needs    Financial resource strain: Not on file   Food insecurity    Worry: Not on file    Inability: Not on file   Transportation needs    Medical: Not on file    Non-medical: Not on file  Tobacco Use   Smoking status: Former Smoker    Packs/day: 1.50    Years: 30.00    Pack years: 45.00    Types: Cigarettes    Quit date: 05/02/1996    Years since quitting: 22.6   Smokeless tobacco: Never Used  Substance and Sexual Activity   Alcohol use: Yes    Alcohol/week: 0.0 standard drinks    Comment: OCC   Drug use: No   Sexual activity: Not on file  Lifestyle   Physical activity    Days per week: Not on file    Minutes per session: Not on file   Stress: Not on file  Relationships   Social connections    Talks on phone: Not on file    Gets together: Not on file    Attends religious service: Not on file    Active member of club or organization: Not on file    Attends meetings of clubs or organizations: Not on file  Relationship status: Not on file   Intimate partner violence    Fear of current or ex partner: Not on file    Emotionally abused: Not on file    Physically abused: Not on file    Forced sexual activity: Not on file  Other Topics Concern   Not on file  Social History Narrative   Not on file    Family History  Problem Relation Age of Onset   Diabetes Mother    Heart attack Mother    Cancer Sister        cancer of eyes, spread to lung, other places. died at 39   Cancer Sister 73       unknown primary, spread to bone, brain died in 95's   Cancer Paternal Aunt        unk type   Cancer Paternal Grandmother        type unk   Prostate cancer Neg Hx    Kidney cancer Neg Hx    Bladder Cancer Neg Hx      Current Outpatient Medications:    Cyanocobalamin (B-12 COMPLIANCE INJECTION IJ), Inject 1 Dose as directed every 30 (thirty) days., Disp: , Rfl:    morphine (ROXANOL) 20 MG/ML concentrated solution, Take 0.5 mLs (10 mg total) by mouth every 6  (six) hours as needed for severe pain., Disp: 30 mL, Rfl: 0   naproxen sodium (ALEVE) 220 MG tablet, Take 220 mg by mouth daily as needed (pain). , Disp: , Rfl:    omeprazole (PRILOSEC) 40 MG capsule, TAKE 1 CAPSULE BY MOUTH EVERY DAY (MORNING), Disp: 30 capsule, Rfl: 11   hydrocortisone cream 0.5 %, Apply 1 application topically 2 (two) times daily. (Patient not taking: Reported on 10/31/2018), Disp: 30 g, Rfl: 0   lidocaine-prilocaine (EMLA) cream, Apply 1 application topically as needed. (Patient not taking: Reported on 10/31/2018), Disp: 30 g, Rfl: 0   predniSONE (DELTASONE) 20 MG tablet, Take 2 tablets (40 mg total) by mouth daily with breakfast. (Patient not taking: Reported on 11/30/2018), Disp: 14 tablet, Rfl: 0 No current facility-administered medications for this visit.   Facility-Administered Medications Ordered in Other Visits:    cyanocobalamin ((VITAMIN B-12)) injection 1,000 mcg, 1,000 mcg, Intramuscular, Q30 days, Sindy Guadeloupe, MD, 1,000 mcg at 08/03/18 2549   cyanocobalamin ((VITAMIN B-12)) injection 1,000 mcg, 1,000 mcg, Intramuscular, Q30 days, Sindy Guadeloupe, MD, 1,000 mcg at 09/28/18 1013   cyanocobalamin ((VITAMIN B-12)) injection 1,000 mcg, 1,000 mcg, Intramuscular, Q30 days, Sindy Guadeloupe, MD, 1,000 mcg at 11/30/18 1355  Physical exam:  Vitals:   11/30/18 1320  BP: 130/80  Pulse: 76  Resp: 18  Temp: (!) 96 F (35.6 C)  TempSrc: Tympanic  SpO2: 99%  Weight: 117 lb (53.1 kg)  Height: 5' 1"  (1.549 m)   Physical Exam Constitutional:      Comments: fatigued  HENT:     Head: Normocephalic and atraumatic.  Eyes:     Pupils: Pupils are equal, round, and reactive to light.  Neck:     Musculoskeletal: Normal range of motion.  Cardiovascular:     Rate and Rhythm: Normal rate and regular rhythm.     Heart sounds: Normal heart sounds.  Pulmonary:     Effort: Pulmonary effort is normal.     Breath sounds: Normal breath sounds.  Abdominal:     General: Bowel  sounds are normal.     Palpations: Abdomen is soft.  Musculoskeletal:     Comments: Limitation in  ROM over left shoulder joint. TTP over left arm.   Skin:    General: Skin is warm and dry.  Neurological:     Mental Status: She is alert and oriented to person, place, and time.      CMP Latest Ref Rng & Units 10/31/2018  Glucose 70 - 99 mg/dL 97  BUN 8 - 23 mg/dL 12  Creatinine 0.44 - 1.00 mg/dL 0.68  Sodium 135 - 145 mmol/L 141  Potassium 3.5 - 5.1 mmol/L 4.3  Chloride 98 - 111 mmol/L 105  CO2 22 - 32 mmol/L 25  Calcium 8.9 - 10.3 mg/dL 9.3  Total Protein 6.5 - 8.1 g/dL 7.0  Total Bilirubin 0.3 - 1.2 mg/dL 0.7  Alkaline Phos 38 - 126 U/L 63  AST 15 - 41 U/L 18  ALT 0 - 44 U/L 11   CBC Latest Ref Rng & Units 10/31/2018  WBC 4.0 - 10.5 K/uL 5.0  Hemoglobin 12.0 - 15.0 g/dL 13.6  Hematocrit 36.0 - 46.0 % 41.6  Platelets 150 - 400 K/uL 171    No images are attached to the encounter.  Ct Chest W Contrast  Result Date: 11/08/2018 CLINICAL DATA:  Follow-up breast cancer, history of right lumpectomy and partial mastectomy, history of throat cancer EXAM: CT CHEST, ABDOMEN, AND PELVIS WITH CONTRAST TECHNIQUE: Multidetector CT imaging of the chest, abdomen and pelvis was performed following the standard protocol during bolus administration of intravenous contrast. CONTRAST:  110m OMNIPAQUE IOHEXOL 300 MG/ML SOLN, additional oral enteric contrast COMPARISON:  11/06/2017, 06/05/2018 11/14/2016 FINDINGS: CT CHEST FINDINGS Cardiovascular: Aortic atherosclerosis. Normal heart size. No pericardial effusion. Right internal jugular port catheter. Mediastinum/Nodes: No enlarged mediastinal, hilar, or axillary lymph nodes. Thyroid gland, trachea, and esophagus demonstrate no significant findings. Lungs/Pleura: Mild centrilobular emphysema. Stable 3 mm pulmonary nodule of the left pulmonary apex (series 3, image 13). Stable 1.3 cm nodule of the superior segment left lower lobe (series 3, image 47). Stable  subpleural irregular ground-glass opacity of the left upper lobe (series 3, image 53). No pleural effusion or pneumothorax. Musculoskeletal: Status post right lumpectomy no chest wall mass or suspicious bone lesions identified. CT ABDOMEN PELVIS FINDINGS Hepatobiliary: No solid liver abnormality is seen. No gallstones, gallbladder wall thickening, or biliary dilatation. Pancreas: Unremarkable. No pancreatic ductal dilatation or surrounding inflammatory changes. Spleen: Normal in size without significant abnormality. Adrenals/Urinary Tract: No significant change in appearance of soft tissue in the adrenalectomy bed status post right adrenalectomy (series 2, image 475. Kidneys are normal, without renal calculi, solid lesion, or hydronephrosis. Bladder is unremarkable. Stomach/Bowel: Stomach is within normal limits. Appendix appears normal. No evidence of bowel wall thickening, distention, or inflammatory changes. Sigmoid diverticulosis. Vascular/Lymphatic: Aortic atherosclerosis. No enlarged abdominal or pelvic lymph nodes. Reproductive: No mass or other abnormality. Other: No abdominal wall hernia or abnormality. Trace nonspecific free fluid in the low pelvis (series 2, image 96), unchanged from prior. Musculoskeletal: No acute or significant osseous findings. IMPRESSION: 1. Stable 3 mm pulmonary nodule of the left pulmonary apex (series 3, image 13). Stable 1.3 cm nodule of the superior segment left lower lobe (series 3, image 47). Stable subpleural irregular ground-glass opacity of the left upper lobe (series 3, image 53). No new pulmonary nodules. Attention on follow-up. 2. No significant change in appearance of soft tissue in the adrenalectomy bed status post right adrenalectomy (series 2, image 430. 3.  Unchanged postoperative findings of right lumpectomy. 4. Trace nonspecific free fluid in the low pelvis (series 2, image 96), unchanged  from prior. 5.  Other chronic and incidental findings as detailed above.  Electronically Signed   By: Eddie Candle M.D.   On: 11/08/2018 10:31   Ct Abdomen Pelvis W Contrast  Result Date: 11/08/2018 CLINICAL DATA:  Follow-up breast cancer, history of right lumpectomy and partial mastectomy, history of throat cancer EXAM: CT CHEST, ABDOMEN, AND PELVIS WITH CONTRAST TECHNIQUE: Multidetector CT imaging of the chest, abdomen and pelvis was performed following the standard protocol during bolus administration of intravenous contrast. CONTRAST:  111m OMNIPAQUE IOHEXOL 300 MG/ML SOLN, additional oral enteric contrast COMPARISON:  11/06/2017, 06/05/2018 11/14/2016 FINDINGS: CT CHEST FINDINGS Cardiovascular: Aortic atherosclerosis. Normal heart size. No pericardial effusion. Right internal jugular port catheter. Mediastinum/Nodes: No enlarged mediastinal, hilar, or axillary lymph nodes. Thyroid gland, trachea, and esophagus demonstrate no significant findings. Lungs/Pleura: Mild centrilobular emphysema. Stable 3 mm pulmonary nodule of the left pulmonary apex (series 3, image 13). Stable 1.3 cm nodule of the superior segment left lower lobe (series 3, image 47). Stable subpleural irregular ground-glass opacity of the left upper lobe (series 3, image 53). No pleural effusion or pneumothorax. Musculoskeletal: Status post right lumpectomy no chest wall mass or suspicious bone lesions identified. CT ABDOMEN PELVIS FINDINGS Hepatobiliary: No solid liver abnormality is seen. No gallstones, gallbladder wall thickening, or biliary dilatation. Pancreas: Unremarkable. No pancreatic ductal dilatation or surrounding inflammatory changes. Spleen: Normal in size without significant abnormality. Adrenals/Urinary Tract: No significant change in appearance of soft tissue in the adrenalectomy bed status post right adrenalectomy (series 2, image 477. Kidneys are normal, without renal calculi, solid lesion, or hydronephrosis. Bladder is unremarkable. Stomach/Bowel: Stomach is within normal limits. Appendix appears  normal. No evidence of bowel wall thickening, distention, or inflammatory changes. Sigmoid diverticulosis. Vascular/Lymphatic: Aortic atherosclerosis. No enlarged abdominal or pelvic lymph nodes. Reproductive: No mass or other abnormality. Other: No abdominal wall hernia or abnormality. Trace nonspecific free fluid in the low pelvis (series 2, image 96), unchanged from prior. Musculoskeletal: No acute or significant osseous findings. IMPRESSION: 1. Stable 3 mm pulmonary nodule of the left pulmonary apex (series 3, image 13). Stable 1.3 cm nodule of the superior segment left lower lobe (series 3, image 47). Stable subpleural irregular ground-glass opacity of the left upper lobe (series 3, image 53). No new pulmonary nodules. Attention on follow-up. 2. No significant change in appearance of soft tissue in the adrenalectomy bed status post right adrenalectomy (series 2, image 483. 3.  Unchanged postoperative findings of right lumpectomy. 4. Trace nonspecific free fluid in the low pelvis (series 2, image 96), unchanged from prior. 5.  Other chronic and incidental findings as detailed above. Electronically Signed   By: AEddie CandleM.D.   On: 11/08/2018 10:31     Assessment and plan- Patient is a 63y.o. 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 Final path showed 5 mm grade 3 Er weakly positive 1-10%, pr negative and her 2 negative tumor.Oncotype came back as high risk.  She completed 4 cycles of adjuvant TC chemotherapy in December 2019. She is here for following issues:  1. Lung nodules: I have reviewed ct chest images independently. Discussed findings with her. Patient has left lung nodules which have remained stable over last 1 year. Continue to  monitor  2. Carcinoma involving the adrenal gland. Unknown primary. S/p resection. No evidence of recurrence on recent scans. Continue to monitor  3. B12 deficiency- continue monthly b12 shots. I will see her back in 3 months. No labs  4. Breast cancer- s/p adjuvant chemo in dec 2019. Due for mammogram in august 2020. Ct scans show no distant recurrence.   5. Left shoulder and left arm pain- seen by rheumatology. Received steroid injection in the left shoulder with no significant improvement in her pain. She has a f/u with them in 3 weeks. If pain remains uncontrolled, I will consider getting mri left shoulder and refer to ortho,\. Xrays have been unremarkable.   Visit Diagnosis 1. B12 deficiency   2. Encounter for follow-up surveillance of breast cancer   3. Lung nodules   4. Acute pain of left shoulder      Dr. Randa Evens, MD, MPH [facility] [phone] 12/02/2018 12:58 PM

## 2018-12-06 ENCOUNTER — Ambulatory Visit: Payer: PRIVATE HEALTH INSURANCE

## 2018-12-07 ENCOUNTER — Encounter: Payer: Self-pay | Admitting: Nurse Practitioner

## 2018-12-11 ENCOUNTER — Other Ambulatory Visit: Payer: Self-pay

## 2018-12-11 ENCOUNTER — Ambulatory Visit
Admission: RE | Admit: 2018-12-11 | Discharge: 2018-12-11 | Disposition: A | Discharge status: Home / Self Care | Payer: Medicare HMO | Source: Ambulatory Visit | Attending: General Surgery | Admitting: General Surgery

## 2018-12-11 DIAGNOSIS — Z171 Estrogen receptor negative status [ER-]: Secondary | ICD-10-CM

## 2018-12-11 DIAGNOSIS — C50511 Malignant neoplasm of lower-outer quadrant of right female breast: Secondary | ICD-10-CM | POA: Diagnosis not present

## 2018-12-11 DIAGNOSIS — Z853 Personal history of malignant neoplasm of breast: Secondary | ICD-10-CM | POA: Diagnosis not present

## 2018-12-11 DIAGNOSIS — R928 Other abnormal and inconclusive findings on diagnostic imaging of breast: Secondary | ICD-10-CM | POA: Diagnosis not present

## 2018-12-11 HISTORY — DX: Personal history of antineoplastic chemotherapy: Z92.21

## 2018-12-12 ENCOUNTER — Telehealth: Payer: Self-pay

## 2018-12-12 DIAGNOSIS — G894 Chronic pain syndrome: Secondary | ICD-10-CM | POA: Diagnosis not present

## 2018-12-12 DIAGNOSIS — E559 Vitamin D deficiency, unspecified: Secondary | ICD-10-CM | POA: Diagnosis not present

## 2018-12-12 NOTE — Telephone Encounter (Signed)
Patient notified mammogram normal. Will follow up with Dr Dahlia Byes on 12/31/18.

## 2018-12-18 ENCOUNTER — Ambulatory Visit: Payer: Self-pay | Admitting: General Surgery

## 2018-12-31 ENCOUNTER — Inpatient Hospital Stay: Payer: Medicare HMO | Attending: Oncology

## 2018-12-31 ENCOUNTER — Encounter: Payer: Self-pay | Admitting: Surgery

## 2018-12-31 ENCOUNTER — Other Ambulatory Visit: Payer: Self-pay

## 2018-12-31 ENCOUNTER — Ambulatory Visit (INDEPENDENT_AMBULATORY_CARE_PROVIDER_SITE_OTHER): Payer: Medicare HMO | Admitting: Surgery

## 2018-12-31 VITALS — BP 134/70 | HR 91 | Temp 97.7°F | Ht 61.0 in | Wt 117.0 lb

## 2018-12-31 DIAGNOSIS — Z171 Estrogen receptor negative status [ER-]: Secondary | ICD-10-CM

## 2018-12-31 DIAGNOSIS — Z923 Personal history of irradiation: Secondary | ICD-10-CM | POA: Diagnosis not present

## 2018-12-31 DIAGNOSIS — E538 Deficiency of other specified B group vitamins: Secondary | ICD-10-CM | POA: Diagnosis not present

## 2018-12-31 DIAGNOSIS — C50511 Malignant neoplasm of lower-outer quadrant of right female breast: Secondary | ICD-10-CM | POA: Insufficient documentation

## 2018-12-31 DIAGNOSIS — Z9221 Personal history of antineoplastic chemotherapy: Secondary | ICD-10-CM | POA: Insufficient documentation

## 2018-12-31 DIAGNOSIS — C7970 Secondary malignant neoplasm of unspecified adrenal gland: Secondary | ICD-10-CM | POA: Insufficient documentation

## 2018-12-31 MED ORDER — CYANOCOBALAMIN 1000 MCG/ML IJ SOLN
1000.0000 ug | INTRAMUSCULAR | Status: DC
  Administered 2018-12-31: 11:00:00 1000 ug via INTRAMUSCULAR

## 2018-12-31 NOTE — Patient Instructions (Addendum)
We have sent a referral to Dr Marla Roe in [place]. They will contact you to schedule this appointment.  Follow up with [place] in 3 weeks for a port removal.  The patient has been asked to return to the office in one year with a bilateral diagnostic mammogram.

## 2019-01-01 ENCOUNTER — Ambulatory Visit: Payer: Medicare HMO | Admitting: Student in an Organized Health Care Education/Training Program

## 2019-01-02 NOTE — Progress Notes (Signed)
Outpatient Surgical Follow Up  01/02/2019  Denise Watts is an 63 y.o. female.   Chief Complaint  Patient presents with  . Follow-up    Breast Cancer    HPI: Denise Watts is a 63 year old female with a history of right sided breast cancer status post lumpectomy and radiation therapy by Dr. Bary Castilla on July 2019.Marland Kitchen  Her main issue is deformity and asymmetry.  She is very conscious about her chest wall contour.  She denies any fevers any chills no new masses.  She did have routine mammogram that I have personally reviewed showing no evidence of concerning lesions.  Past Medical History:  Diagnosis Date  . Arthritis    KNEE RIGHT  . Breast cancer [facility]) 1997   right breast lumpectomy with radiation  . Breast cancer ([place]) 11/15/2017   INVASIVE MAMMARY CARCINOMA/ triple negative  . 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[place]) 11/10/2014  . Cough 03/02/2017   INTERMTTENT DRY COUGH  . GERD (gastroesophageal reflux disease)   . History of hiatal hernia   . Personal history of chemotherapy   . Personal history of radiation therapy   . Skin cancer   . Throat cancer [facility]) 1998   radiation    Past Surgical History:  Procedure Laterality Date  . BREAST BIOPSY Right 1997   positive  . BREAST BIOPSY Right 11/15/2017   INVASIVE MAMMARY CARCINOMA triple negative  . BREAST LUMPECTOMY Right 1997   2019 also  . BREAST LUMPECTOMY WITH SENTINEL LYMPH NODE BIOPSY Right 12/08/2017   Procedure: BREAST LUMPECTOMY WITH SENTINEL LYMPH NODE BX;  Surgeon: Robert Bellow, MD;  Location: [facility] ORS;  Service: General;  Laterality: Right;  . COLONOSCOPY  2015  . ESOPHAGUS SURGERY    . KNEE SURGERY Right   . PORTACATH PLACEMENT Right 01/17/2018   Procedure: INSERTION PORT-A-CATH- RIGHT;  Surgeon: Robert Bellow, MD;  Location: [facility] ORS;  Service: General;  Laterality: Right;  . ROBOTIC ADRENALECTOMY Right 12/28/2016   Procedure: ROBOTIC ADRENALECTOMY;  Surgeon: Hollice Espy, MD;  Location: [facility] ORS;  Service: Urology;   Laterality: Right;  . SKIN CANCER EXCISION    . THROAT SURGERY  1998   throat cancer   . TUBAL LIGATION    . VENTRAL HERNIA REPAIR N/A 03/03/2017   Procedure: HERNIA REPAIR VENTRAL ADULT;  Surgeon: Clayburn Pert, MD;  Location: [facility] ORS;  Service: General;  Laterality: N/A;    Family History  Problem Relation Age of Onset  . Diabetes Mother   . Heart attack Mother   . Cancer Sister        cancer of eyes, spread to lung, other places. died at 23  . Cancer Sister 71       unknown primary, spread to bone, brain died in 22's  . Cancer Paternal Aunt        unk type  . Cancer Paternal Grandmother        type unk  . Prostate cancer Neg Hx   . Kidney cancer Neg Hx   . Bladder Cancer Neg Hx     Social History:  reports that she quit smoking about 22 years ago. Her smoking use included cigarettes. She has a 45.00 pack-year smoking history. She has never used smokeless tobacco. She reports current alcohol use. She reports that she does not use drugs.  Allergies: No Known Allergies  Medications reviewed.    ROS Full ROS performed and is otherwise negative other than what is stated in HPI   BP 134/70  Pulse 91   Temp 97.7 F (36.5 C)   Ht 5\' 1"  (1.549 m)   Wt 117 lb (53.1 kg)   SpO2 99%   BMI 22.11 kg/m   Physical Exam Vitals signs and nursing note reviewed. Exam conducted with a chaperone present.  Constitutional:      General: She is not in acute distress.    Appearance: Normal appearance. She is normal weight.  Eyes:     General: No scleral icterus.       Right eye: No discharge.        Left eye: No discharge.  Neck:     Musculoskeletal: Normal range of motion and neck supple. No neck rigidity or muscular tenderness.  Cardiovascular:     Rate and Rhythm: Normal rate and regular rhythm.     Pulses: Normal pulses.     Heart sounds: No murmur.  Pulmonary:     Effort: Pulmonary effort is normal. No respiratory distress.     Breath sounds: Normal breath sounds. No  stridor.     Comments: Breast: There is evidence of previous lumpectomy and sentinel lymph node scars.  There is significant asymmetry and deformity on the right breast after the lumpectomy and radiation.  There is no evidence of new concerning masses in either breast.  There is no evidence of lymphadenopathy.  Port is in place without evidence of infection Musculoskeletal: Normal range of motion.  Skin:    General: Skin is warm and dry.     Capillary Refill: Capillary refill takes less than 2 seconds.     Coloration: Skin is not jaundiced.  Neurological:     General: No focal deficit present.     Mental Status: She is alert and oriented to person, place, and time.  Psychiatric:        Mood and Affect: Mood normal.        Behavior: Behavior normal.        Thought Content: Thought content normal.        Judgment: Judgment normal.         Assessment/Plan: 63 year old female with a history of breast cancer status post lumpectomy and radiation therapy now with significant deformity to the right breast and asymmetry.  There is no evidence of recurrent disease.  She is very happy with cosmetic outcome and I will refer her to Dr. Marla Roe for further evaluation. We may be able to schedule an outpatient port removal at her convenience We will see her back in 1 year with mammogram and physical exam.    Greater than 50% of the 25 minutes  visit was spent in counseling/coordination of care   Caroleen Hamman, MD [place] [facility]geon

## 2019-01-03 ENCOUNTER — Telehealth: Payer: Self-pay

## 2019-01-03 NOTE — Telephone Encounter (Signed)

## 2019-01-04 ENCOUNTER — Ambulatory Visit: Payer: Medicare HMO | Admitting: Plastic Surgery

## 2019-01-04 ENCOUNTER — Encounter: Payer: Self-pay | Admitting: Plastic Surgery

## 2019-01-04 ENCOUNTER — Other Ambulatory Visit: Payer: Self-pay

## 2019-01-04 VITALS — BP 125/72 | HR 76 | Temp 97.8°F | Ht 61.0 in | Wt 117.0 lb

## 2019-01-04 DIAGNOSIS — Z171 Estrogen receptor negative status [ER-]: Secondary | ICD-10-CM

## 2019-01-04 DIAGNOSIS — Z9011 Acquired absence of right breast and nipple: Secondary | ICD-10-CM

## 2019-01-04 DIAGNOSIS — Z9010 Acquired absence of unspecified breast and nipple: Secondary | ICD-10-CM | POA: Insufficient documentation

## 2019-01-04 DIAGNOSIS — N6489 Other specified disorders of breast: Secondary | ICD-10-CM | POA: Insufficient documentation

## 2019-01-04 DIAGNOSIS — C50511 Malignant neoplasm of lower-outer quadrant of right female breast: Secondary | ICD-10-CM

## 2019-01-04 NOTE — Progress Notes (Signed)
Patient ID: Denise Watts, female    DOB: 1955-05-23, 64 y.o.   MRN: [id number]   Chief Complaint  Patient presents with   Breast Problem    The patient is a 63 year old female here for evaluation and consultation for breast reconstruction.  The patient underwent a partial mastectomy in 1997 for right breast cancer.  She was radiated at that time.  She then had a recurrent breast cancer a year ago and underwent a partial mastectomy with repeat radiation.  She is also had throat cancer and possible lung cancer.  She is 5 feet 1 inches tall and weighs 117 pounds.  She has obvious asymmetry between the breasts.  She has contracture of the right inferior lateral breast.  The skin is very tight.  She has not had any treatment to date.  She is about 9 months post radiation.  She is ~ 36 B/C cup.  She has a strong history of tobacco use.   Review of Systems  Constitutional: Negative for activity change and appetite change.  HENT: Negative for dental problem.   Eyes: Negative.   Respiratory: Negative.   Cardiovascular: Negative.   Gastrointestinal: Negative.   Endocrine: Negative.   Genitourinary: Negative.   Musculoskeletal: Negative.   Neurological: Negative.   Hematological: Negative.   Psychiatric/Behavioral: Negative.     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   Cough 03/02/2017   INTERMTTENT DRY COUGH   GERD (gastroesophageal reflux disease)    History of hiatal hernia    Personal history of chemotherapy    Personal history of radiation therapy    Skin cancer    Throat cancer ([place]) 1998   radiation    Past Surgical History:  Procedure Laterality Date   BREAST BIOPSY Right 1997   positive   BREAST BIOPSY Right 11/15/2017   INVASIVE MAMMARY  CARCINOMA triple negative   BREAST LUMPECTOMY Right 1997   2019 also   BREAST LUMPECTOMY WITH SENTINEL LYMPH NODE BIOPSY Right 12/08/2017   Procedure: BREAST LUMPECTOMY WITH SENTINEL LYMPH NODE BX;  Surgeon: Robert Bellow, MD;  Location: [place] [facility];  Service: General;  Laterality: Right;   COLONOSCOPY  2015   ESOPHAGUS SURGERY     KNEE SURGERY Right    PORTACATH PLACEMENT Right 01/17/2018   Procedure: INSERTION PORT-A-CATH- RIGHT;  Surgeon: Robert Bellow, MD;  Location: [facility];  Service: General;  Laterality: Right;   ROBOTIC ADRENALECTOMY Right 12/28/2016   Procedure: ROBOTIC ADRENALECTOMY;  Surgeon: Hollice Espy, MD;  Location: [facility];  Service: Urology;  Laterality: Right;   SKIN CANCER EXCISION     THROAT SURGERY  1998   throat cancer    TUBAL LIGATION     VENTRAL HERNIA REPAIR N/A 03/03/2017   Procedure: HERNIA REPAIR VENTRAL ADULT;  Surgeon: Clayburn Pert, MD;  Location: [facility] ORS;  Service: General;  Laterality: N/A;      Current Outpatient Medications:    Cyanocobalamin (B-12 COMPLIANCE INJECTION IJ), Inject 1 Dose as directed every 30 (thirty) days., Disp: , Rfl:    DULoxetine (CYMBALTA) 30 MG capsule, Take 30 mg by mouth daily., Disp: , Rfl:    hydrocortisone cream 0.5 %, Apply 1 application topically 2 (two) times daily. (Patient not taking: Reported  on 01/04/2019), Disp: 30 g, Rfl: 0   lidocaine-prilocaine (EMLA) cream, Apply 1 application topically as needed., Disp: 30 g, Rfl: 0   naproxen sodium (ALEVE) 220 MG tablet, Take 220 mg by mouth daily as needed (pain). , Disp: , Rfl:    omeprazole (PRILOSEC) 40 MG capsule, TAKE 1 CAPSULE BY MOUTH EVERY DAY (MORNING), Disp: 30 capsule, Rfl: 11 No current facility-administered medications for this visit.   Facility-Administered Medications Ordered in Other Visits:    cyanocobalamin ((VITAMIN B-12)) injection 1,000 mcg, 1,000 mcg, Intramuscular, Q30 days, Sindy Guadeloupe, MD, 1,000 mcg at 08/03/18 [id number]    cyanocobalamin ((VITAMIN B-12)) injection 1,000 mcg, 1,000 mcg, Intramuscular, Q30 days, Sindy Guadeloupe, MD, 1,000 mcg at 09/28/18 1013   cyanocobalamin ((VITAMIN B-12)) injection 1,000 mcg, 1,000 mcg, Intramuscular, Q30 days, Sindy Guadeloupe, MD, 1,000 mcg at 11/30/18 1355   Objective:   Vitals:   01/04/19 1458  BP: 125/72  Pulse: 76  Temp: 97.8 F (36.6 C)  SpO2: 97%    Physical Exam Vitals signs and nursing note reviewed.  Constitutional:      Appearance: Normal appearance.  Eyes:     Extraocular Movements: Extraocular movements intact.  Cardiovascular:     Rate and Rhythm: Normal rate.     Pulses: Normal pulses.  Pulmonary:     Effort: Pulmonary effort is normal.  Chest:    Abdominal:     General: Abdomen is flat. There is no distension.     Tenderness: There is no abdominal tenderness.  Skin:    General: Skin is warm.  Neurological:     General: No focal deficit present.     Mental Status: She is alert and oriented to person, place, and time.  Psychiatric:        Mood and Affect: Mood normal.        Behavior: Behavior normal.        Thought Content: Thought content normal.     Assessment & Plan:  Malignant neoplasm of lower-outer quadrant of right breast of female, estrogen receptor negative (HCC)  Postoperative breast asymmetry  Status post partial mastectomy of right breast  The patient is a candidate for release of the scar contracture and fat filling for symmetry of the right breast.  This should be done approximately 1 year after her last radiation treatment.  I request that she come see me in January and will reevaluate.  In the meantime she needs to massage the area and this will help decrease the contracture. Pictures were obtained of the patient and placed in the chart with the patient's or guardian's permission.   [place], DO

## 2019-01-23 ENCOUNTER — Other Ambulatory Visit: Payer: Self-pay

## 2019-01-23 ENCOUNTER — Encounter: Payer: Self-pay | Admitting: Surgery

## 2019-01-23 ENCOUNTER — Ambulatory Visit (INDEPENDENT_AMBULATORY_CARE_PROVIDER_SITE_OTHER): Payer: Medicare HMO | Admitting: Surgery

## 2019-01-23 VITALS — BP 125/69 | HR 101 | Temp 97.9°F | Ht 61.0 in | Wt 120.2 lb

## 2019-01-23 DIAGNOSIS — Z171 Estrogen receptor negative status [ER-]: Secondary | ICD-10-CM

## 2019-01-23 DIAGNOSIS — C50511 Malignant neoplasm of lower-outer quadrant of right female breast: Secondary | ICD-10-CM | POA: Diagnosis not present

## 2019-01-23 NOTE — Patient Instructions (Addendum)
You may shower the day after tomorrow. We have surgical glue over your incision. There are sutures below the skin. These will take several months to fully dissolve.  You may apply an ice pack to the area several times today. You may use Ibuprofen or Tylenol for any pain control.   Call us if you have any uncontrolled bleeding or excessive pain.   Implanted Port Removal Implanted port removal is a procedure to remove the port and catheter (port-a-cath) that is implanted under your skin. The port is a small disc under your skin that can be punctured with a needle. It is connected to a vein in your chest or neck by a small flexible tube (catheter). The port-a-cath is used for treatment through an IV tube and for taking blood samples. Your health care provider will remove the port-a-cath if:  You no longer need it for treatment.  It is not working properly.  The area around it gets infected.  Tell a health care provider about:  Any allergies you have.  All medicines you are taking, including vitamins, herbs, eye drops, creams, and over-the-counter medicines.  Any problems you or family members have had with anesthetic medicines.  Any blood disorders you have.  Any surgeries you have had.  Any medical conditions you have.  Whether you are pregnant or may be pregnant. What are the risks? Generally, this is a safe procedure. However, problems may occur, including:  Infection.  Bleeding.  Allergic reactions to anesthetic medicines.  Damage to nerves or blood vessels.  What happens before the procedure?  You will have: ? A physical exam. ? Blood tests. ? Imaging tests, including a chest X-ray.  Follow instructions from your health care provider about eating or drinking restrictions.  Ask your health care provider about: ? Changing or stopping your regular medicines. This is especially important if you are taking diabetes medicines or blood thinners. ? Taking medicines such  as aspirin and ibuprofen. These medicines can thin your blood. Do not take these medicines before your procedure if your surgeon instructs you not to.  Ask your health care provider how your surgical site will be marked or identified.  You may be given antibiotic medicine to help prevent infection.  Plan to have someone take you home after the procedure.  If you will be going home right after the procedure, plan to have someone stay with you for 24 hours. What happens during the procedure?  To reduce your risk of infection: ? Your health care team will wash or sanitize their hands. ? Your skin will be washed with soap.  You may be given one or more of the following: ? A medicine to help you relax (sedative). ? A medicine to numb the area (local anesthetic).  A small cut (incision) will be made at the site of your port-a-cath.  The port-a-cath and the catheter that has been inside your vein will gently be removed.  The incision will be closed with stitches (sutures), adhesive strips, or skin glue.  A bandage (dressing) will be placed over the incision. The procedure may vary among health care providers and hospitals. What happens after the procedure?  Your blood pressure, heart rate, breathing rate, and blood oxygen level will be monitored often until the medicines you were given have worn off.  Do not drive for 24 hours if you received a sedative. This information is not intended to replace advice given to you by your health care provider. Make sure you  discuss any questions you have with your health care provider. Document Released: 03/30/2015 Document Revised: 09/24/2015 Document Reviewed: 01/21/2015 Elsevier Interactive Patient Education  [employer].

## 2019-01-25 ENCOUNTER — Other Ambulatory Visit: Payer: Self-pay | Admitting: *Deleted

## 2019-01-25 DIAGNOSIS — Z23 Encounter for immunization: Secondary | ICD-10-CM | POA: Insufficient documentation

## 2019-01-25 NOTE — Progress Notes (Signed)
PROCEDURE NOTE 1. Removal of port   Dx: Hx breast CA   Anesthesia: Lidocaine 1% with epinephrine total 12c  EBL: Minimal  Complications: None  After informed consent was obtained patient was prepped and draped in the usual sterile fashion local anesthetic was infiltrated and a 15 blade knife was used to create incision and using Metzenbaum scissors we dissected and opened the pocket.  The Prolene sutures were removed.  We asked the patient to do a Valsalva maneuver and we remove the catheter and the port.  There were no complications.  Pressure was applied.  The wound was closed in a 2 layer fashion with 3-0 Vicryl and 4-0 Monocryl for the skin.  Dermabond was applied.  Caroleen Hamman, MD [facility] Surgeon

## 2019-01-29 ENCOUNTER — Other Ambulatory Visit: Payer: Self-pay

## 2019-01-30 ENCOUNTER — Inpatient Hospital Stay: Payer: Medicare HMO

## 2019-01-31 ENCOUNTER — Other Ambulatory Visit: Payer: Self-pay

## 2019-01-31 ENCOUNTER — Inpatient Hospital Stay: Payer: Medicare HMO | Attending: Oncology

## 2019-01-31 DIAGNOSIS — E538 Deficiency of other specified B group vitamins: Secondary | ICD-10-CM | POA: Insufficient documentation

## 2019-01-31 DIAGNOSIS — Z171 Estrogen receptor negative status [ER-]: Secondary | ICD-10-CM | POA: Insufficient documentation

## 2019-01-31 DIAGNOSIS — Z923 Personal history of irradiation: Secondary | ICD-10-CM | POA: Diagnosis not present

## 2019-01-31 DIAGNOSIS — Z23 Encounter for immunization: Secondary | ICD-10-CM | POA: Diagnosis not present

## 2019-01-31 DIAGNOSIS — Z9221 Personal history of antineoplastic chemotherapy: Secondary | ICD-10-CM | POA: Diagnosis not present

## 2019-01-31 DIAGNOSIS — C7970 Secondary malignant neoplasm of unspecified adrenal gland: Secondary | ICD-10-CM | POA: Insufficient documentation

## 2019-01-31 DIAGNOSIS — C50511 Malignant neoplasm of lower-outer quadrant of right female breast: Secondary | ICD-10-CM | POA: Insufficient documentation

## 2019-01-31 MED ORDER — CYANOCOBALAMIN 1000 MCG/ML IJ SOLN
1000.0000 ug | INTRAMUSCULAR | Status: DC
  Administered 2019-01-31: 1000 ug via INTRAMUSCULAR
  Filled 2019-01-31: qty 1

## 2019-01-31 MED ORDER — INFLUENZA VAC A&B SA ADJ QUAD 0.5 ML IM PRSY
0.5000 mL | PREFILLED_SYRINGE | Freq: Once | INTRAMUSCULAR | Status: AC
  Administered 2019-01-31: 0.5 mL via INTRAMUSCULAR
  Filled 2019-01-31: qty 0.5

## 2019-02-13 ENCOUNTER — Encounter: Payer: Self-pay | Admitting: Radiation Oncology

## 2019-02-13 ENCOUNTER — Other Ambulatory Visit: Payer: Self-pay

## 2019-02-13 ENCOUNTER — Ambulatory Visit
Admission: RE | Admit: 2019-02-13 | Discharge: 2019-02-13 | Disposition: A | Discharge status: Home / Self Care | Payer: Medicare HMO | Source: Ambulatory Visit | Attending: Radiation Oncology | Admitting: Radiation Oncology

## 2019-02-13 VITALS — BP 134/76 | HR 93 | Resp 16 | Wt 122.7 lb

## 2019-02-13 DIAGNOSIS — R918 Other nonspecific abnormal finding of lung field: Secondary | ICD-10-CM | POA: Diagnosis not present

## 2019-02-13 DIAGNOSIS — Z923 Personal history of irradiation: Secondary | ICD-10-CM | POA: Diagnosis not present

## 2019-02-13 DIAGNOSIS — Z853 Personal history of malignant neoplasm of breast: Secondary | ICD-10-CM | POA: Insufficient documentation

## 2019-02-13 DIAGNOSIS — C50511 Malignant neoplasm of lower-outer quadrant of right female breast: Secondary | ICD-10-CM

## 2019-02-13 NOTE — Progress Notes (Signed)
Radiation Oncology Follow up Note  Name: Denise Watts   Date:   02/13/2019 MRN:  [id number] DOB: [birth year]    This 63 y.o. female presents to the clinic today for 1 year follow-up for salvage radiation issue therapy using partial breast irradiation after undergoing partial breast radiation 1997.  REFERRING PROVIDER: Margo Common, PA  HPI: Patient is a 63 year old female who underwent salvage radiation therapy now 1 year out to her right breast.  She initially had wide local excision and partial breast radiation back in 1997 and then received salvage partial breast radiation 1 year prior for recurrence of the breast.  She is seen today in routine follow-up is doing well.  She still has some pain occasionally in the right breast otherwise specifically denies bone pain cough..  Patient had a mammogram back in August which I have reviewed was BI-RADS 2 benign.  Patient is being followed for 3 mm pulmonary nodule in the left pulmonary pulmonary apex which has been stable.  Also other lesions are also stable.  She is not on any antiestrogen therapy.  COMPLICATIONS OF TREATMENT: none  FOLLOW UP COMPLIANCE: keeps appointments   PHYSICAL EXAM:  BP 134/76 (BP Location: Left Arm, Patient Position: Sitting)   Pulse 93   Resp 16   Wt 122 lb 11.2 oz (55.7 kg)   BMI 23.18 kg/m  Lungs are clear to A&P cardiac examination essentially unremarkable with regular rate and rhythm. No dominant mass or nodularity is noted in either breast in 2 positions examined. Incision is well-healed. No axillary or supraclavicular adenopathy is appreciated. Cosmetic result is excellent.  Patient does have some nodularity in her anterior neck associated with the hyoid bone not certain of the etiology of that I have asked her to see ENT should that persist.  Well-developed well-nourished patient in NAD. HEENT reveals PERLA, EOMI, discs not visualized.  Oral cavity is clear. No oral mucosal lesions are identified. Neck is  clear without evidence of cervical or supraclavicular adenopathy. Lungs are clear to A&P. Cardiac examination is essentially unremarkable with regular rate and rhythm without murmur rub or thrill. Abdomen is benign with no organomegaly or masses noted. Motor sensory and DTR levels are equal and symmetric in the upper and lower extremities. Cranial nerves II through XII are grossly intact. Proprioception is intact. No peripheral adenopathy or edema is identified. No motor or sensory levels are noted. Crude visual fields are within normal range.  RADIOLOGY RESULTS: Mammograms and CT scan of chest abdomen pelvis reviewed  PLAN: Present time patient is doing well with no evidence disease as far as her breast cancer is concerned.  Again have asked her possibly see ENT for unusual I believe calcifications in the anterior neck associated with her hyoid bone.  I have asked otherwise to see her back in 1 year for follow-up.  Patient knows to call with any concerns at any time.  I would like to take this opportunity to thank you for allowing me to participate in the care of your patient.Noreene Filbert, MD

## 2019-02-15 ENCOUNTER — Encounter: Payer: Self-pay | Admitting: Family Medicine

## 2019-02-15 ENCOUNTER — Ambulatory Visit (INDEPENDENT_AMBULATORY_CARE_PROVIDER_SITE_OTHER): Payer: Medicare HMO | Admitting: Family Medicine

## 2019-02-15 ENCOUNTER — Other Ambulatory Visit: Payer: Self-pay

## 2019-02-15 VITALS — BP 110/80 | HR 68 | Temp 97.3°F | Resp 16 | Wt 122.0 lb

## 2019-02-15 DIAGNOSIS — R590 Localized enlarged lymph nodes: Secondary | ICD-10-CM | POA: Diagnosis not present

## 2019-02-15 DIAGNOSIS — Z85819 Personal history of malignant neoplasm of unspecified site of lip, oral cavity, and pharynx: Secondary | ICD-10-CM | POA: Diagnosis not present

## 2019-02-15 DIAGNOSIS — Z853 Personal history of malignant neoplasm of breast: Secondary | ICD-10-CM | POA: Diagnosis not present

## 2019-02-15 NOTE — Progress Notes (Signed)
Patient: Denise Watts Female    DOB: 07-Sep-1955   63 y.o.   MRN: [id number] Visit Date: 02/15/2019  Today's Provider: Vernie Murders, PA   Chief Complaint  Patient presents with  . Cyst  . Arm Pain   Subjective:     HPI Patient here today c/o knot on right side of neck. Patient reports she noticed yesterday. Patient reports knot is tender to touch. Patient denies any pain with drinking or eating. Patient reports she has a history of throat cancer.   Patient c/o worsening left arm pain. Patient reports pain has been present for months. Patient reports she has been worked up for pain. Patient reports she is taking cymbalta and it does help with pain. Patient reports pain is worse in the morning.   Past Medical History:  Diagnosis Date  . Arthritis    KNEE RIGHT  . Breast cancer [facility]) 1997   right breast lumpectomy with radiation  . Breast cancer ([place]) 11/15/2017   INVASIVE MAMMARY CARCINOMA/ triple negative  . 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[place]) 11/10/2014  . Cough 03/02/2017   INTERMTTENT DRY COUGH  . GERD (gastroesophageal reflux disease)   . History of hiatal hernia   . Personal history of chemotherapy   . Personal history of radiation therapy   . Skin cancer   . Throat cancer [facility]) 1998   radiation   Past Surgical History:  Procedure Laterality Date  . BREAST BIOPSY Right 1997   positive  . BREAST BIOPSY Right 11/15/2017   INVASIVE MAMMARY CARCINOMA triple negative  . BREAST LUMPECTOMY Right 1997   2019 also  . BREAST LUMPECTOMY WITH SENTINEL LYMPH NODE BIOPSY Right 12/08/2017   Procedure: BREAST LUMPECTOMY WITH SENTINEL LYMPH NODE BX;  Surgeon: Robert Bellow, MD;  Location: [facility] ORS;  Service: General;  Laterality: Right;  . COLONOSCOPY  2015  . ESOPHAGUS SURGERY    . KNEE SURGERY Right   . PORTACATH PLACEMENT Right 01/17/2018   Procedure: INSERTION PORT-A-CATH- RIGHT;  Surgeon: Robert Bellow, MD;  Location: [facility] ORS;  Service: General;  Laterality:  Right;  . ROBOTIC ADRENALECTOMY Right 12/28/2016   Procedure: ROBOTIC ADRENALECTOMY;  Surgeon: Hollice Espy, MD;  Location: [facility] ORS;  Service: Urology;  Laterality: Right;  . SKIN CANCER EXCISION    . THROAT SURGERY  1998   throat cancer   . TUBAL LIGATION    . VENTRAL HERNIA REPAIR N/A 03/03/2017   Procedure: HERNIA REPAIR VENTRAL ADULT;  Surgeon: Clayburn Pert, MD;  Location: [facility] ORS;  Service: General;  Laterality: N/A;   Family History  Problem Relation Age of Onset  . Diabetes Mother   . Heart attack Mother   . Cancer Sister        cancer of eyes, spread to lung, other places. died at 56  . Cancer Sister 70       unknown primary, spread to bone, brain died in 79's  . Cancer Paternal Aunt        unk type  . Cancer Paternal Grandmother        type unk  . Prostate cancer Neg Hx   . Kidney cancer Neg Hx   . Bladder Cancer Neg Hx    No Known Allergies  Current Outpatient Medications:  .  Cyanocobalamin (B-12 COMPLIANCE INJECTION IJ), Inject 1 Dose as directed every 30 (thirty) days., Disp: , Rfl:  .  DULoxetine (CYMBALTA) 30 MG capsule, Take 30 mg by mouth daily., Disp: ,  Rfl:  .  naproxen sodium (ALEVE) 220 MG tablet, Take 220 mg by mouth daily as needed (pain). , Disp: , Rfl:  .  omeprazole (PRILOSEC) 40 MG capsule, TAKE 1 CAPSULE BY MOUTH EVERY DAY (MORNING), Disp: 30 capsule, Rfl: 11 No current facility-administered medications for this visit.   Facility-Administered Medications Ordered in Other Visits:  .  cyanocobalamin ((VITAMIN B-12)) injection 1,000 mcg, 1,000 mcg, Intramuscular, Q30 days, Sindy Guadeloupe, MD, 1,000 mcg at 08/03/18 0936 .  cyanocobalamin ((VITAMIN B-12)) injection 1,000 mcg, 1,000 mcg, Intramuscular, Q30 days, Sindy Guadeloupe, MD, 1,000 mcg at 09/28/18 1013 .  cyanocobalamin ((VITAMIN B-12)) injection 1,000 mcg, 1,000 mcg, Intramuscular, Q30 days, Sindy Guadeloupe, MD, 1,000 mcg at 11/30/18 1355  Review of Systems  Constitutional: Negative for  appetite change, chills and fever.  HENT: Negative for sore throat.   Cardiovascular: Negative.   Musculoskeletal: Positive for myalgias.    Social History   Tobacco Use  . Smoking status: Former Smoker    Packs/day: 1.50    Years: 30.00    Pack years: 45.00    Types: Cigarettes    Quit date: 05/02/1996    Years since quitting: 22.8  . Smokeless tobacco: Never Used  Substance Use Topics  . Alcohol use: Yes    Alcohol/week: 0.0 standard drinks    Comment: OCC      Objective:   BP 110/80 (BP Location: Left Arm, Patient Position: Sitting, Cuff Size: Normal)   Pulse 68   Temp (!) 97.3 F (36.3 C) (Temporal)   Resp 16   Wt 122 lb (55.3 kg)   SpO2 99%   BMI 23.05 kg/m  Vitals:   02/15/19 1403  BP: 110/80  Pulse: 68  Resp: 16  Temp: (!) 97.3 F (36.3 C)  TempSrc: Temporal  SpO2: 99%  Weight: 122 lb (55.3 kg)  Body mass index is 23.05 kg/m.   Physical Exam Constitutional:      General: She is not in acute distress.    Appearance: She is well-developed.  HENT:     Head: Normocephalic and atraumatic.     Right Ear: Hearing normal.     Left Ear: Hearing normal.     Nose: Nose normal.  Eyes:     General: Lids are normal. No scleral icterus.       Right eye: No discharge.        Left eye: No discharge.     Conjunctiva/sclera: Conjunctivae normal.  Neck:     Musculoskeletal: Muscular tenderness present.     Comments: Enlarged right anterior cervical node with slightly prominent left anterior node. Some scarring across larynx with history of vocal cord cancer. Cardiovascular:     Rate and Rhythm: Normal rate and regular rhythm.     Heart sounds: Normal heart sounds.  Pulmonary:     Effort: Pulmonary effort is normal. No respiratory distress.     Comments: Large area of scarring in the area of mastectomy for breast cancer in the right breast area. No local lymphadenopathy. Musculoskeletal: Normal range of motion.  Lymphadenopathy:     Cervical: Cervical  adenopathy present.  Skin:    Findings: No lesion or rash.  Neurological:     Mental Status: She is alert and oriented to person, place, and time.  Psychiatric:        Speech: Speech normal.        Behavior: Behavior normal.        Thought Content: Thought content normal.  Assessment & Plan    1. Anterior cervical lymphadenopathy Feels this is a new finding and not related to the scarring from radiation treatment for throat cancer. No changes recognized to voice. No swallowing difficulties. Wants to contact her oncologist (Dr. Janese Banks) regarding possible CT or MRI evaluation.  2. History of throat cancer Carcinoma of the vocal cord T1, N0, M0 tumor in 2015..  Status postradiation therapy.  3. History of right breast cancer Carcinoma of breast, T1, N0, M0 tumor diagnosis.  In January of 1997. Has very hard and tender scarring from mastectomy and scheduled for plastic surgeon evaluation.     Vernie Murders, PA  [facility]

## 2019-02-18 ENCOUNTER — Telehealth: Payer: Self-pay | Admitting: *Deleted

## 2019-02-18 NOTE — Telephone Encounter (Signed)
Called pt. Back after she left me a message and she says that she went to PCP office and she has knot on right side of neck and it is sore. PCP said she should see Dr. Janese Banks.  She also states her left shoulder and arm pain is getting worse and that Dr. Janese Banks had told her that if it cont. To hurt that Janese Banks could do MRI. I spoke to Janese Banks and we can see pt Thursday or she can come see NP for symptom management tom. She prefers to see Dr. Janese Banks on Thursday. I gave her appt for 10/22 at 9:15. She is agreeable to date and time

## 2019-02-21 ENCOUNTER — Other Ambulatory Visit: Payer: Self-pay

## 2019-02-21 ENCOUNTER — Encounter: Payer: Self-pay | Admitting: Oncology

## 2019-02-21 ENCOUNTER — Other Ambulatory Visit: Payer: Self-pay | Admitting: *Deleted

## 2019-02-21 ENCOUNTER — Inpatient Hospital Stay (HOSPITAL_BASED_OUTPATIENT_CLINIC_OR_DEPARTMENT_OTHER): Payer: Medicare HMO | Admitting: Oncology

## 2019-02-21 ENCOUNTER — Telehealth: Payer: Self-pay | Admitting: *Deleted

## 2019-02-21 VITALS — BP 137/80 | HR 93 | Temp 98.2°F | Resp 16 | Wt 119.8 lb

## 2019-02-21 DIAGNOSIS — Z23 Encounter for immunization: Secondary | ICD-10-CM | POA: Diagnosis not present

## 2019-02-21 DIAGNOSIS — C7970 Secondary malignant neoplasm of unspecified adrenal gland: Secondary | ICD-10-CM | POA: Diagnosis not present

## 2019-02-21 DIAGNOSIS — M25512 Pain in left shoulder: Secondary | ICD-10-CM

## 2019-02-21 DIAGNOSIS — Z171 Estrogen receptor negative status [ER-]: Secondary | ICD-10-CM | POA: Diagnosis not present

## 2019-02-21 DIAGNOSIS — M898X2 Other specified disorders of bone, upper arm: Secondary | ICD-10-CM

## 2019-02-21 DIAGNOSIS — Z9221 Personal history of antineoplastic chemotherapy: Secondary | ICD-10-CM | POA: Diagnosis not present

## 2019-02-21 DIAGNOSIS — E538 Deficiency of other specified B group vitamins: Secondary | ICD-10-CM | POA: Diagnosis not present

## 2019-02-21 DIAGNOSIS — Z923 Personal history of irradiation: Secondary | ICD-10-CM | POA: Diagnosis not present

## 2019-02-21 DIAGNOSIS — C50511 Malignant neoplasm of lower-outer quadrant of right female breast: Secondary | ICD-10-CM | POA: Diagnosis not present

## 2019-02-21 NOTE — Telephone Encounter (Signed)
Called pt to let her know that I am working on all the appts that needs to be done. The one I have done is MRI it will be of left shoulder and left upper arm. Date 10/28 at 2 pm and arrive 30 min early at medical mall at [facility]. I am still working on bx, ortho consult and ent consult. I will call her back once the others are done. She is agreeable to this

## 2019-02-21 NOTE — Progress Notes (Signed)
Pt has felt knot on her right neck area right below her chin. It is hard and fixed and been there over a month. Her left shoulder and arm cont. To hurt her. She is using cbd oil for pain

## 2019-02-21 NOTE — Progress Notes (Signed)
Hematology/Oncology Consult note [facility]  [phone] [phone]  Patient Care Team: Chrismon, Vickki Muff, PA as PCP - General (Physician Assistant) Rico Junker, RN as Oncology Nurse Navigator Sindy Guadeloupe, MD as Consulting Physician (Oncology) Bary Castilla Forest Gleason, MD (General Surgery)   Name of the patient: Denise Watts  [id number]  Nov 23, 1955   Date of visit: 02/21/19  Diagnosis-  1.H/o breast cancer in 1997 2. H/o stage I SCC of vocal cord in 2015. 3. Lung nodules4. Adrenal carcinoma of unknown primary s/p resection5. Stage IaT1a NxcM0 ER weakly positive 1-10%, PR negative and her 2 negative   Chief complaint/ Reason for visit-acute visit for right neck swelling and skin lesions around the neck as well as ongoing left shoulder pain  Heme/Onc history:  Oncology History Overview Note  1. Carcinoma of breast, T1, N0, M0 tumor diagnosis.  In January of 1997 2. Carcinoma of the vocal cord T1, N0, M0 tumor.  Status postradiation therapy 3. Abnormal CT scan of the chest (December, 2015) 4. Repeat CT scan of chest shows stable nodule (July, 2016) 5. Neutropenia with normal hemoglobin and normal platelet count (July, 2016)   Cancer of vocal cord [facility])  11/10/2014 Initial Diagnosis   Cancer of vocal cord   Malignant neoplasm of lower-outer quadrant of right breast of female, estrogen receptor negative ([place])  11/24/2017 Initial Diagnosis   Malignant neoplasm of lower-outer quadrant of right breast of female, estrogen receptor negative ([place])   01/12/2018 Cancer Staging   Staging form: Breast, AJCC 8th Edition - Clinical stage from 01/12/2018: Stage IB (cT1a, cN0, cM0, G3, ER+, PR-, HER2-) - Signed by Sindy Guadeloupe, MD on 01/12/2018   01/30/2018 -  Chemotherapy   The patient had palonosetron (ALOXI) injection 0.25 mg, 0.25 mg, Intravenous,  Once, 4 of 4 cycles Administration: 0.25 mg (01/30/2018), 0.25 mg (02/27/2018), 0.25 mg  (03/28/2018), 0.25 mg (04/26/2018) pegfilgrastim (NEULASTA ONPRO KIT) injection 6 mg, 6 mg, Subcutaneous, Once, 4 of 4 cycles Administration: 6 mg (01/30/2018), 6 mg (02/27/2018), 6 mg (03/28/2018), 6 mg (04/26/2018) cyclophosphamide (CYTOXAN) 1,000 mg in sodium chloride 0.9 % 250 mL chemo infusion, 600 mg/m2 = 1,000 mg, Intravenous,  Once, 4 of 4 cycles Administration: 1,000 mg (01/30/2018), 1,000 mg (02/27/2018), 1,000 mg (03/28/2018), 1,000 mg (04/26/2018) DOCEtaxel (TAXOTERE) 130 mg in sodium chloride 0.9 % 250 mL chemo infusion, 75 mg/m2 = 130 mg, Intravenous,  Once, 4 of 4 cycles Dose modification: 60 mg/m2 (original dose 75 mg/m2, Cycle 2, Reason: Dose not tolerated) Administration: 130 mg (01/30/2018), 100 mg (02/27/2018), 100 mg (03/28/2018), 100 mg (04/26/2018)  for chemotherapy treatment.       Interval history-patient had seen me back in July 2020 for symptoms of left shoulder pain and at that time we had done X-rays which did not reveal any fracture.  She subsequently had CT chest abdomen and pelvis in July 2020 which also did not reveal any evidence of malignancy.  Patient states that her shoulder pain which radiates to her left upper arm has not improved at all.  She is not able to raise her arm overhead and any kind of touch over her left arm causes pain.  She has also noticed right neck swelling as well as skin lesions around her neck for the last 2 weeks.  ECOG PS- 1 Pain scale- 0   Review of systems- Review of Systems  Constitutional: Positive for malaise/fatigue. Negative for chills, fever and weight loss.  HENT: Negative  for congestion, ear discharge and nosebleeds.        Right neck swelling and skin lesions around the neck  Eyes: Negative for blurred vision.  Respiratory: Negative for cough, hemoptysis, sputum production, shortness of breath and wheezing.   Cardiovascular: Negative for chest pain, palpitations, orthopnea and claudication.  Gastrointestinal: Negative for  abdominal pain, blood in stool, constipation, diarrhea, heartburn, melena, nausea and vomiting.  Genitourinary: Negative for dysuria, flank pain, frequency, hematuria and urgency.  Musculoskeletal: Positive for joint pain (Left shoulder and left arm pain). Negative for back pain and myalgias.  Skin: Negative for rash.  Neurological: Negative for dizziness, tingling, focal weakness, seizures, weakness and headaches.  Endo/Heme/Allergies: Does not bruise/bleed easily.  Psychiatric/Behavioral: Negative for depression and suicidal ideas. The patient does not have insomnia.       No Known Allergies   Past Medical History:  Diagnosis Date  . Arthritis    KNEE RIGHT  . Breast cancer [facility]) 1997   right breast lumpectomy with radiation  . Breast cancer ([place]) 11/15/2017   INVASIVE MAMMARY CARCINOMA/ triple negative  . 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[place]) 11/10/2014  . Cough 03/02/2017   INTERMTTENT DRY COUGH  . GERD (gastroesophageal reflux disease)   . History of hiatal hernia   . Personal history of chemotherapy   . Personal history of radiation therapy   . Skin cancer   . Throat cancer [facility]) 1998   radiation     Past Surgical History:  Procedure Laterality Date  . BREAST BIOPSY Right 1997   positive  . BREAST BIOPSY Right 11/15/2017   INVASIVE MAMMARY CARCINOMA triple negative  . BREAST LUMPECTOMY Right 1997   2019 also  . BREAST LUMPECTOMY WITH SENTINEL LYMPH NODE BIOPSY Right 12/08/2017   Procedure: BREAST LUMPECTOMY WITH SENTINEL LYMPH NODE BX;  Surgeon: Robert Bellow, MD;  Location: [facility] ORS;  Service: General;  Laterality: Right;  . COLONOSCOPY  2015  . ESOPHAGUS SURGERY    . KNEE SURGERY Right   . PORT-A-CATH REMOVAL    . PORTACATH PLACEMENT Right 01/17/2018   Procedure: INSERTION PORT-A-CATH- RIGHT;  Surgeon: Robert Bellow, MD;  Location: [facility] ORS;  Service: General;  Laterality: Right;  . ROBOTIC ADRENALECTOMY Right 12/28/2016   Procedure: ROBOTIC ADRENALECTOMY;   Surgeon: Hollice Espy, MD;  Location: [facility] ORS;  Service: Urology;  Laterality: Right;  . SKIN CANCER EXCISION    . THROAT SURGERY  1998   throat cancer   . TUBAL LIGATION    . VENTRAL HERNIA REPAIR N/A 03/03/2017   Procedure: HERNIA REPAIR VENTRAL ADULT;  Surgeon: Clayburn Pert, MD;  Location: [facility] ORS;  Service: General;  Laterality: N/A;    Social History   Socioeconomic History  . Marital status: Married    Spouse name: Not on file  . Number of children: Not on file  . Years of education: Not on file  . Highest education level: Not on file  Occupational History  . Not on file  Social Needs  . Financial resource strain: Not on file  . Food insecurity    Worry: Not on file    Inability: Not on file  . Transportation needs    Medical: Not on file    Non-medical: Not on file  Tobacco Use  . Smoking status: Former Smoker    Packs/day: 1.50    Years: 30.00    Pack years: 45.00    Types: Cigarettes    Quit date: 05/02/1996    Years since quitting:  22.8  . Smokeless tobacco: Never Used  Substance and Sexual Activity  . Alcohol use: Not Currently    Alcohol/week: 0.0 standard drinks  . Drug use: No  . Sexual activity: Not on file  Lifestyle  . Physical activity    Days per week: Not on file    Minutes per session: Not on file  . Stress: Not on file  Relationships  . Social Herbalist on phone: Not on file    Gets together: Not on file    Attends religious service: Not on file    Active member of club or organization: Not on file    Attends meetings of clubs or organizations: Not on file    Relationship status: Not on file  . Intimate partner violence    Fear of current or ex partner: Not on file    Emotionally abused: Not on file    Physically abused: Not on file    Forced sexual activity: Not on file  Other Topics Concern  . Not on file  Social History Narrative  . Not on file    Family History  Problem Relation Age of Onset  . Diabetes  Mother   . Heart attack Mother   . Cancer Sister        cancer of eyes, spread to lung, other places. died at 91  . Cancer Sister 29       unknown primary, spread to bone, brain died in 52's  . Cancer Paternal Aunt        unk type  . Cancer Paternal Grandmother        type unk  . Prostate cancer Neg Hx   . Kidney cancer Neg Hx   . Bladder Cancer Neg Hx      Current Outpatient Medications:  .  Cyanocobalamin (B-12 COMPLIANCE INJECTION IJ), Inject 1 Dose as directed every 30 (thirty) days., Disp: , Rfl:  .  DULoxetine (CYMBALTA) 30 MG capsule, Take 30 mg by mouth daily., Disp: , Rfl:  .  naproxen sodium (ALEVE) 220 MG tablet, Take 220 mg by mouth daily as needed (pain). , Disp: , Rfl:  .  omeprazole (PRILOSEC) 40 MG capsule, TAKE 1 CAPSULE BY MOUTH EVERY DAY (MORNING), Disp: 30 capsule, Rfl: 11 No current facility-administered medications for this visit.   Facility-Administered Medications Ordered in Other Visits:  .  cyanocobalamin ((VITAMIN B-12)) injection 1,000 mcg, 1,000 mcg, Intramuscular, Q30 days, Sindy Guadeloupe, MD, 1,000 mcg at 08/03/18 0936 .  cyanocobalamin ((VITAMIN B-12)) injection 1,000 mcg, 1,000 mcg, Intramuscular, Q30 days, Sindy Guadeloupe, MD, 1,000 mcg at 09/28/18 1013 .  cyanocobalamin ((VITAMIN B-12)) injection 1,000 mcg, 1,000 mcg, Intramuscular, Q30 days, Sindy Guadeloupe, MD, 1,000 mcg at 11/30/18 1355  Physical exam:  Vitals:   02/21/19 0929 02/21/19 0943  BP: 137/80   Pulse: 93   Resp: 16   Temp: 98.2 F (36.8 C)   TempSrc: Tympanic   Weight:  119 lb 12.8 oz (54.3 kg)   Physical Exam Constitutional:      General: She is not in acute distress.    Comments: She is frail and appears fatigued  HENT:     Head: Normocephalic and atraumatic.     Mouth/Throat:     Mouth: Mucous membranes are moist.     Pharynx: Oropharynx is clear.  Eyes:     Pupils: Pupils are equal, round, and reactive to light.  Neck:     Musculoskeletal: Normal range  of motion.      Comments: There is scarring and induration at the site of prior radiation.  There is a hard palpable mass about 2 cm in her right neck just below the lower jaw.  There are scattered acneiform lesions noted around the midline of the neck Cardiovascular:     Rate and Rhythm: Normal rate and regular rhythm.     Heart sounds: Normal heart sounds.  Pulmonary:     Effort: Pulmonary effort is normal.     Breath sounds: Normal breath sounds.  Abdominal:     General: Bowel sounds are normal.     Palpations: Abdomen is soft.  Musculoskeletal:     Comments: There is tenderness to palpation over the left arm.  Limitation in left arm abduction.  Skin:    General: Skin is warm and dry.  Neurological:     Mental Status: She is alert and oriented to person, place, and time.      CMP Latest Ref Rng & Units 10/31/2018  Glucose 70 - 99 mg/dL 97  BUN 8 - 23 mg/dL 12  Creatinine 0.44 - 1.00 mg/dL 0.68  Sodium 135 - 145 mmol/L 141  Potassium 3.5 - 5.1 mmol/L 4.3  Chloride 98 - 111 mmol/L 105  CO2 22 - 32 mmol/L 25  Calcium 8.9 - 10.3 mg/dL 9.3  Total Protein 6.5 - 8.1 g/dL 7.0  Total Bilirubin 0.3 - 1.2 mg/dL 0.7  Alkaline Phos 38 - 126 U/L 63  AST 15 - 41 U/L 18  ALT 0 - 44 U/L 11   CBC Latest Ref Rng & Units 10/31/2018  WBC 4.0 - 10.5 K/uL 5.0  Hemoglobin 12.0 - 15.0 g/dL 13.6  Hematocrit 36.0 - 46.0 % 41.6  Platelets 150 - 400 K/uL 171     Assessment and plan- Patient is a 63 y.o. 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 Final path showed 5 mm grade 3 Er weakly positive 1-10%, pr negative and her 2 negative tumor.Oncotype came back as high risk.She completed 4 cycles of adjuvant TC chemotherapy in December 2019.  1.  Left shoulder  and left arm pain: Etiology unclear.  I will proceed with MRI of her left shoulder at this time and refer her to orthopedics  2.  Right neck swelling: No other areas of palpable adenopathy.  I will arrange for an ultrasound-guided biopsy of the site.  She also has acneform lesions around the center portion of the neck and it is unclear if this could be secondary to her wearing a mask chronically versus some other etiology.  I would ideally like the skin lesions to be biopsied in addition to the lymph nodes at the same time and I will arrange for an IR guided biopsy of the same.  Given her prior history of cancer of the vocal cord I will also refer her to ENT at this time  3. I will tentatively see her back in 2 weeks time to discuss the results of the biopsy and MRI and further management  4. Patient also has B12 deficiency for which she is on monthly B12 shots.  5. Carcinoma involving the adrenal gland: S/p resection and no evidence of recurrence seen on recent scans.  Visit Diagnosis 1. Acute pain of left shoulder      Dr. Randa Evens, MD, MPH [facility] at [facility] [phone] 02/21/2019 12:13 PM

## 2019-02-24 ENCOUNTER — Other Ambulatory Visit: Payer: Self-pay | Admitting: *Deleted

## 2019-02-24 DIAGNOSIS — C50511 Malignant neoplasm of lower-outer quadrant of right female breast: Secondary | ICD-10-CM

## 2019-02-24 DIAGNOSIS — R05 Cough: Secondary | ICD-10-CM

## 2019-02-24 DIAGNOSIS — R221 Localized swelling, mass and lump, neck: Secondary | ICD-10-CM

## 2019-02-24 DIAGNOSIS — L708 Other acne: Secondary | ICD-10-CM

## 2019-02-24 DIAGNOSIS — R059 Cough, unspecified: Secondary | ICD-10-CM

## 2019-02-24 DIAGNOSIS — Z171 Estrogen receptor negative status [ER-]: Secondary | ICD-10-CM

## 2019-02-26 ENCOUNTER — Other Ambulatory Visit: Payer: Self-pay

## 2019-02-26 DIAGNOSIS — Z20822 Contact with and (suspected) exposure to covid-19: Secondary | ICD-10-CM

## 2019-02-26 DIAGNOSIS — K1121 Acute sialoadenitis: Secondary | ICD-10-CM | POA: Diagnosis not present

## 2019-02-26 DIAGNOSIS — Z8521 Personal history of malignant neoplasm of larynx: Secondary | ICD-10-CM | POA: Diagnosis not present

## 2019-02-27 ENCOUNTER — Other Ambulatory Visit: Payer: Self-pay

## 2019-02-27 ENCOUNTER — Telehealth: Payer: Self-pay | Admitting: *Deleted

## 2019-02-27 ENCOUNTER — Ambulatory Visit
Admission: RE | Admit: 2019-02-27 | Discharge: 2019-02-27 | Disposition: A | Discharge status: Home / Self Care | Payer: Medicare HMO | Source: Ambulatory Visit | Attending: Oncology | Admitting: Oncology

## 2019-02-27 DIAGNOSIS — M898X2 Other specified disorders of bone, upper arm: Secondary | ICD-10-CM | POA: Insufficient documentation

## 2019-02-27 DIAGNOSIS — M25512 Pain in left shoulder: Secondary | ICD-10-CM

## 2019-02-27 DIAGNOSIS — M79622 Pain in left upper arm: Secondary | ICD-10-CM | POA: Diagnosis not present

## 2019-02-27 DIAGNOSIS — M19012 Primary osteoarthritis, left shoulder: Secondary | ICD-10-CM | POA: Diagnosis not present

## 2019-02-27 NOTE — Telephone Encounter (Signed)
Called pt to let her know that I am working on bx for the neck mass but when we sent the request to IR that Dr. Janese Banks had to speak to the radiologist and he wanted to have ct of soft tissue of neck to make sure what he is dealing with as to how to get bx. I have scheduled her for ct of neck this Friday 10/30 with arrival 7:45 outpt [place] and nothing but liq. 4 hours prior. She is agreeable to this and then I can set up bx. ENT saw her yest. And we do not have note yet but office said it would be 1-2 days. Ortho called pt. Yest. Evening and she will call them back to get appt set up.

## 2019-02-28 ENCOUNTER — Telehealth: Payer: Self-pay | Admitting: *Deleted

## 2019-02-28 ENCOUNTER — Other Ambulatory Visit: Payer: Self-pay

## 2019-02-28 ENCOUNTER — Other Ambulatory Visit: Payer: Self-pay | Admitting: *Deleted

## 2019-02-28 ENCOUNTER — Other Ambulatory Visit: Payer: Medicare HMO

## 2019-02-28 ENCOUNTER — Ambulatory Visit: Admission: RE | Admit: 2019-02-28 | Payer: Medicare HMO | Source: Ambulatory Visit

## 2019-02-28 ENCOUNTER — Ambulatory Visit
Admission: RE | Admit: 2019-02-28 | Discharge: 2019-02-28 | Disposition: A | Discharge status: Home / Self Care | Payer: Medicare HMO | Source: Ambulatory Visit | Attending: Oncology | Admitting: Oncology

## 2019-02-28 DIAGNOSIS — R936 Abnormal findings on diagnostic imaging of limbs: Secondary | ICD-10-CM | POA: Insufficient documentation

## 2019-02-28 DIAGNOSIS — S45202A Unspecified injury of axillary or brachial vein, left side, initial encounter: Secondary | ICD-10-CM | POA: Insufficient documentation

## 2019-02-28 DIAGNOSIS — M25512 Pain in left shoulder: Secondary | ICD-10-CM | POA: Diagnosis not present

## 2019-02-28 LAB — NOVEL CORONAVIRUS, NAA: SARS-CoV-2, NAA: NOT DETECTED

## 2019-02-28 NOTE — Telephone Encounter (Signed)
Called pt and let her know that MRI showed a low flow in axillary vein of left arm and because it is slow we would like to see if she has a clot. Would like to have u/s of left upper ext. Pt agreeable and the appt is in [place] today at 1 pm. She is fine with it

## 2019-03-01 ENCOUNTER — Other Ambulatory Visit: Payer: Self-pay

## 2019-03-01 ENCOUNTER — Ambulatory Visit
Admission: RE | Admit: 2019-03-01 | Discharge: 2019-03-01 | Disposition: A | Discharge status: Home / Self Care | Payer: Medicare HMO | Source: Ambulatory Visit | Attending: Oncology | Admitting: Oncology

## 2019-03-01 ENCOUNTER — Inpatient Hospital Stay: Payer: Medicare HMO

## 2019-03-01 DIAGNOSIS — Z171 Estrogen receptor negative status [ER-]: Secondary | ICD-10-CM | POA: Diagnosis not present

## 2019-03-01 DIAGNOSIS — C50511 Malignant neoplasm of lower-outer quadrant of right female breast: Secondary | ICD-10-CM

## 2019-03-01 DIAGNOSIS — L708 Other acne: Secondary | ICD-10-CM | POA: Diagnosis not present

## 2019-03-01 DIAGNOSIS — E538 Deficiency of other specified B group vitamins: Secondary | ICD-10-CM

## 2019-03-01 DIAGNOSIS — Z23 Encounter for immunization: Secondary | ICD-10-CM

## 2019-03-01 DIAGNOSIS — R221 Localized swelling, mass and lump, neck: Secondary | ICD-10-CM | POA: Diagnosis not present

## 2019-03-01 DIAGNOSIS — Z923 Personal history of irradiation: Secondary | ICD-10-CM | POA: Diagnosis not present

## 2019-03-01 DIAGNOSIS — Z9221 Personal history of antineoplastic chemotherapy: Secondary | ICD-10-CM | POA: Diagnosis not present

## 2019-03-01 DIAGNOSIS — C7970 Secondary malignant neoplasm of unspecified adrenal gland: Secondary | ICD-10-CM | POA: Diagnosis not present

## 2019-03-01 LAB — POCT I-STAT CREATININE: Creatinine, Ser: 0.7 mg/dL (ref 0.44–1.00)

## 2019-03-01 MED ORDER — CYANOCOBALAMIN 1000 MCG/ML IJ SOLN
1000.0000 ug | INTRAMUSCULAR | Status: DC
  Administered 2019-03-01: 1000 ug via INTRAMUSCULAR
  Filled 2019-03-01: qty 1

## 2019-03-01 MED ORDER — IOHEXOL 300 MG/ML  SOLN
75.0000 mL | Freq: Once | INTRAMUSCULAR | Status: AC | PRN
  Administered 2019-03-01: 75 mL via INTRAVENOUS

## 2019-03-05 ENCOUNTER — Ambulatory Visit: Payer: Medicare HMO

## 2019-03-12 ENCOUNTER — Inpatient Hospital Stay: Payer: Medicare HMO | Attending: Oncology | Admitting: Oncology

## 2019-03-12 ENCOUNTER — Other Ambulatory Visit: Payer: Self-pay

## 2019-03-12 ENCOUNTER — Encounter: Payer: Self-pay | Admitting: Oncology

## 2019-03-12 VITALS — BP 126/84 | HR 84 | Temp 98.6°F | Wt 123.0 lb

## 2019-03-12 DIAGNOSIS — Z87891 Personal history of nicotine dependence: Secondary | ICD-10-CM | POA: Diagnosis not present

## 2019-03-12 DIAGNOSIS — K219 Gastro-esophageal reflux disease without esophagitis: Secondary | ICD-10-CM | POA: Diagnosis not present

## 2019-03-12 DIAGNOSIS — R1310 Dysphagia, unspecified: Secondary | ICD-10-CM | POA: Diagnosis not present

## 2019-03-12 DIAGNOSIS — Z85828 Personal history of other malignant neoplasm of skin: Secondary | ICD-10-CM | POA: Insufficient documentation

## 2019-03-12 DIAGNOSIS — K1123 Chronic sialoadenitis: Secondary | ICD-10-CM | POA: Diagnosis not present

## 2019-03-12 DIAGNOSIS — M25512 Pain in left shoulder: Secondary | ICD-10-CM | POA: Insufficient documentation

## 2019-03-12 DIAGNOSIS — R5381 Other malaise: Secondary | ICD-10-CM | POA: Diagnosis not present

## 2019-03-12 DIAGNOSIS — R5383 Other fatigue: Secondary | ICD-10-CM | POA: Insufficient documentation

## 2019-03-12 DIAGNOSIS — Z79899 Other long term (current) drug therapy: Secondary | ICD-10-CM | POA: Diagnosis not present

## 2019-03-12 DIAGNOSIS — Z171 Estrogen receptor negative status [ER-]: Secondary | ICD-10-CM | POA: Insufficient documentation

## 2019-03-12 DIAGNOSIS — R221 Localized swelling, mass and lump, neck: Secondary | ICD-10-CM | POA: Diagnosis not present

## 2019-03-12 DIAGNOSIS — Z8521 Personal history of malignant neoplasm of larynx: Secondary | ICD-10-CM | POA: Diagnosis not present

## 2019-03-12 DIAGNOSIS — Z923 Personal history of irradiation: Secondary | ICD-10-CM | POA: Diagnosis not present

## 2019-03-12 DIAGNOSIS — Z9221 Personal history of antineoplastic chemotherapy: Secondary | ICD-10-CM | POA: Insufficient documentation

## 2019-03-12 DIAGNOSIS — M1990 Unspecified osteoarthritis, unspecified site: Secondary | ICD-10-CM | POA: Insufficient documentation

## 2019-03-12 DIAGNOSIS — C50511 Malignant neoplasm of lower-outer quadrant of right female breast: Secondary | ICD-10-CM | POA: Insufficient documentation

## 2019-03-12 DIAGNOSIS — M542 Cervicalgia: Secondary | ICD-10-CM | POA: Insufficient documentation

## 2019-03-12 DIAGNOSIS — R42 Dizziness and giddiness: Secondary | ICD-10-CM | POA: Diagnosis not present

## 2019-03-12 NOTE — Progress Notes (Signed)
Patient stated that she continues to have pain on her left shoulder. Patient stated that she was diagnosed with bursitis on her left shoulder. Patient was given antibiotics for it but is now done with them.

## 2019-03-14 NOTE — Progress Notes (Signed)
Hematology/Oncology Consult note [facility]  [phone] [phone]  Patient Care Team: Chrismon, Vickki Muff, PA as PCP - General (Physician Assistant) Rico Junker, RN as Oncology Nurse Navigator Sindy Guadeloupe, MD as Consulting Physician (Oncology) Bary Castilla Forest Gleason, MD (General Surgery)   Name of the patient: Denise Watts  [id number]  [birth year]   Date of visit: 03/14/19  Diagnosis- 1.H/o breast cancer in 1997 2. H/o stage I SCC of vocal cord in 2015. 3. Lung nodules4. Adrenal carcinoma of unknown primary s/p resection5. Stage IaT1a NxcM0 ER weakly positive 1-10%, PR negative and her 2 negative   Chief complaint/ Reason for visit- discuss mri results and further management  Heme/Onc history:  Oncology History Overview Note  1. Carcinoma of breast, T1, N0, M0 tumor diagnosis.  In January of 1997 2. Carcinoma of the vocal cord T1, N0, M0 tumor.  Status postradiation therapy 3. Abnormal CT scan of the chest (December, 2015) 4. Repeat CT scan of chest shows stable nodule (July, 2016) 5. Neutropenia with normal hemoglobin and normal platelet count (July, 2016)   Cancer of vocal cord [facility])  11/10/2014 Initial Diagnosis   Cancer of vocal cord   Malignant neoplasm of lower-outer quadrant of right breast of female, estrogen receptor negative ([place])  11/24/2017 Initial Diagnosis   Malignant neoplasm of lower-outer quadrant of right breast of female, estrogen receptor negative ([place])   01/12/2018 Cancer Staging   Staging form: Breast, AJCC 8th Edition - Clinical stage from 01/12/2018: Stage IB (cT1a, cN0, cM0, G3, ER+, PR-, HER2-) - Signed by Sindy Guadeloupe, MD on 01/12/2018   01/30/2018 -  Chemotherapy   The patient had palonosetron (ALOXI) injection 0.25 mg, 0.25 mg, Intravenous,  Once, 4 of 4 cycles Administration: 0.25 mg (01/30/2018), 0.25 mg (02/27/2018), 0.25 mg (03/28/2018), 0.25 mg (04/26/2018) pegfilgrastim (NEULASTA ONPRO  KIT) injection 6 mg, 6 mg, Subcutaneous, Once, 4 of 4 cycles Administration: 6 mg (01/30/2018), 6 mg (02/27/2018), 6 mg (03/28/2018), 6 mg (04/26/2018) cyclophosphamide (CYTOXAN) 1,000 mg in sodium chloride 0.9 % 250 mL chemo infusion, 600 mg/m2 = 1,000 mg, Intravenous,  Once, 4 of 4 cycles Administration: 1,000 mg (01/30/2018), 1,000 mg (02/27/2018), 1,000 mg (03/28/2018), 1,000 mg (04/26/2018) DOCEtaxel (TAXOTERE) 130 mg in sodium chloride 0.9 % 250 mL chemo infusion, 75 mg/m2 = 130 mg, Intravenous,  Once, 4 of 4 cycles Dose modification: 60 mg/m2 (original dose 75 mg/m2, Cycle 2, Reason: Dose not tolerated) Administration: 130 mg (01/30/2018), 100 mg (02/27/2018), 100 mg (03/28/2018), 100 mg (04/26/2018)  for chemotherapy treatment.       Interval history- patient continues to hae pain in her neck, left shoulder and left arm. Reports fatigue  ECOG PS- 2 Pain scale- 3 Opioid associated constipation- no  Review of systems- Review of Systems  Constitutional: Positive for malaise/fatigue. Negative for chills, fever and weight loss.  HENT: Negative for congestion, ear discharge and nosebleeds.   Eyes: Negative for blurred vision.  Respiratory: Negative for cough, hemoptysis, sputum production, shortness of breath and wheezing.   Cardiovascular: Negative for chest pain, palpitations, orthopnea and claudication.  Gastrointestinal: Negative for abdominal pain, blood in stool, constipation, diarrhea, heartburn, melena, nausea and vomiting.  Genitourinary: Negative for dysuria, flank pain, frequency, hematuria and urgency.  Musculoskeletal: Negative for back pain, joint pain and myalgias.       Left arm pain  Skin: Negative for rash.  Neurological: Negative for dizziness, tingling, focal weakness, seizures, weakness and headaches.  Endo/Heme/Allergies: Does not  bruise/bleed easily.  Psychiatric/Behavioral: Negative for depression and suicidal ideas. The patient does not have insomnia.         No Known Allergies   Past Medical History:  Diagnosis Date   Arthritis    KNEE RIGHT   Breast cancer ([place]) 1997   right breast lumpectomy with radiation   Breast cancer ([place]) 11/15/2017   INVASIVE MAMMARY CARCINOMA/ triple negative   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[facility]) 11/10/2014   Cough 03/02/2017   INTERMTTENT DRY COUGH   GERD (gastroesophageal reflux disease)    History of hiatal hernia    Personal history of chemotherapy    Personal history of radiation therapy    Skin cancer    Throat cancer ([place]) 1998   radiation     Past Surgical History:  Procedure Laterality Date   BREAST BIOPSY Right 1997   positive   BREAST BIOPSY Right 11/15/2017   INVASIVE MAMMARY CARCINOMA triple negative   BREAST LUMPECTOMY Right 1997   2019 also   BREAST LUMPECTOMY WITH SENTINEL LYMPH NODE BIOPSY Right 12/08/2017   Procedure: BREAST LUMPECTOMY WITH SENTINEL LYMPH NODE BX;  Surgeon: Robert Bellow, MD;  Location: [facility];  Service: General;  Laterality: Right;   COLONOSCOPY  2015   ESOPHAGUS SURGERY     KNEE SURGERY Right    PORT-A-CATH REMOVAL     PORTACATH PLACEMENT Right 01/17/2018   Procedure: INSERTION PORT-A-CATH- RIGHT;  Surgeon: Robert Bellow, MD;  Location: [facility];  Service: General;  Laterality: Right;   ROBOTIC ADRENALECTOMY Right 12/28/2016   Procedure: ROBOTIC ADRENALECTOMY;  Surgeon: Hollice Espy, MD;  Location: [facility];  Service: Urology;  Laterality: Right;   SKIN CANCER EXCISION     THROAT SURGERY  1998   throat cancer    TUBAL LIGATION     VENTRAL HERNIA REPAIR N/A 03/03/2017   Procedure: HERNIA REPAIR VENTRAL ADULT;  Surgeon: Clayburn Pert, MD;  Location: [facility];  Service: General;  Laterality: N/A;    Social History   Socioeconomic History   Marital status: Married    Spouse name: Not on file   Number of children: Not on file   Years of education: Not on file   Highest education level: Not on file  Occupational  History   Not on file  Social Needs   Financial resource strain: Not on file   Food insecurity    Worry: Not on file    Inability: Not on file   Transportation needs    Medical: Not on file    Non-medical: Not on file  Tobacco Use   Smoking status: Former Smoker    Packs/day: 1.50    Years: 30.00    Pack years: 45.00    Types: Cigarettes    Quit date: 05/02/1996    Years since quitting: 22.8   Smokeless tobacco: Never Used  Substance and Sexual Activity   Alcohol use: Not Currently    Alcohol/week: 0.0 standard drinks   Drug use: No   Sexual activity: Not on file  Lifestyle   Physical activity    Days per week: Not on file    Minutes per session: Not on file   Stress: Not on file  Relationships   Social connections    Talks on phone: Not on file    Gets together: Not on file    Attends religious service: Not on file    Active member of club or organization: Not on file    Attends meetings of  clubs or organizations: Not on file    Relationship status: Not on file   Intimate partner violence    Fear of current or ex partner: Not on file    Emotionally abused: Not on file    Physically abused: Not on file    Forced sexual activity: Not on file  Other Topics Concern   Not on file  Social History Narrative   Not on file    Family History  Problem Relation Age of Onset   Diabetes Mother    Heart attack Mother    Cancer Sister        cancer of eyes, spread to lung, other places. died at 73   Cancer Sister 71       unknown primary, spread to bone, brain died in 18's   Cancer Paternal Aunt        unk type   Cancer Paternal Grandmother        type unk   Prostate cancer Neg Hx    Kidney cancer Neg Hx    Bladder Cancer Neg Hx      Current Outpatient Medications:    Cyanocobalamin (B-12 COMPLIANCE INJECTION IJ), Inject 1 Dose as directed every 30 (thirty) days., Disp: , Rfl:    DULoxetine (CYMBALTA) 30 MG capsule, Take 30 mg by mouth  daily., Disp: , Rfl:    naproxen sodium (ALEVE) 220 MG tablet, Take 220 mg by mouth daily as needed (pain). , Disp: , Rfl:    omeprazole (PRILOSEC) 40 MG capsule, TAKE 1 CAPSULE BY MOUTH EVERY DAY (MORNING), Disp: 30 capsule, Rfl: 11 No current facility-administered medications for this visit.   Facility-Administered Medications Ordered in Other Visits:    cyanocobalamin ((VITAMIN B-12)) injection 1,000 mcg, 1,000 mcg, Intramuscular, Q30 days, Sindy Guadeloupe, MD, 1,000 mcg at 08/03/18 0174   cyanocobalamin ((VITAMIN B-12)) injection 1,000 mcg, 1,000 mcg, Intramuscular, Q30 days, Sindy Guadeloupe, MD, 1,000 mcg at 09/28/18 1013   cyanocobalamin ((VITAMIN B-12)) injection 1,000 mcg, 1,000 mcg, Intramuscular, Q30 days, Sindy Guadeloupe, MD, 1,000 mcg at 11/30/18 1355  Physical exam:  Vitals:   03/12/19 1339  BP: 126/84  Pulse: 84  Temp: 98.6 F (37 C)  TempSrc: Tympanic  Weight: 123 lb (55.8 kg)   Physical Exam Constitutional:      Comments: She is frail and fatigued  HENT:     Head: Normocephalic and atraumatic.  Eyes:     Pupils: Pupils are equal, round, and reactive to light.  Neck:     Musculoskeletal: Normal range of motion.     Comments: Induration/ irregular skin indentation over midline neck Cardiovascular:     Rate and Rhythm: Normal rate and regular rhythm.     Heart sounds: Normal heart sounds.  Pulmonary:     Effort: Pulmonary effort is normal.     Breath sounds: Normal breath sounds.  Abdominal:     General: Bowel sounds are normal.     Palpations: Abdomen is soft.  Skin:    General: Skin is warm and dry.  Neurological:     Mental Status: She is alert and oriented to person, place, and time.      CMP Latest Ref Rng & Units 03/01/2019  Glucose 70 - 99 mg/dL -  BUN 8 - 23 mg/dL -  Creatinine 0.44 - 1.00 mg/dL 0.70  Sodium 135 - 145 mmol/L -  Potassium 3.5 - 5.1 mmol/L -  Chloride 98 - 111 mmol/L -  CO2 22 - 32  mmol/L -  Calcium 8.9 - 10.3 mg/dL -  Total  Protein 6.5 - 8.1 g/dL -  Total Bilirubin 0.3 - 1.2 mg/dL -  Alkaline Phos 38 - 126 U/L -  AST 15 - 41 U/L -  ALT 0 - 44 U/L -   CBC Latest Ref Rng & Units 10/31/2018  WBC 4.0 - 10.5 K/uL 5.0  Hemoglobin 12.0 - 15.0 g/dL 13.6  Hematocrit 36.0 - 46.0 % 41.6  Platelets 150 - 400 K/uL 171    No images are attached to the encounter.  Ct Soft Tissue Neck W Contrast  Result Date: 03/01/2019 CLINICAL DATA:  Palpable abnormality under chin. History of vocal cord lesion in 2015. EXAM: CT NECK WITH CONTRAST TECHNIQUE: Multidetector CT imaging of the neck was performed using the standard protocol following the bolus administration of intravenous contrast. CONTRAST:  28m OMNIPAQUE IOHEXOL 300 MG/ML  SOLN COMPARISON:  01/20/2017 FINDINGS: Pharynx and larynx: No evidence of mass or swelling. Salivary glands: Fatty atrophy that is dense in the submandibular glands. Thyroid: Normal Lymph nodes: None enlarged or abnormal density. Vascular: Chronic appearing luminal web within the right internal jugular vein extending to the level of the SVC, likely a fibrin sheath from old porta catheter. An extra venous tract is seen in continuity which extends to a small area of scarring/seroma in the upper right chest. Limited intracranial: Negative Visualized orbits: Negative Mastoids and visualized paranasal sinuses: Clear Skeleton: No acute or aggressive finding Upper chest: Lobulated 13 mm superior segment left lower lobe nodule most recently characterized by CT 11/08/2018. Other: Palpable marker correlates with coarse calcification in the subcutaneous space of the anterior neck, dystrophic calcification presumably related to history of radiation. Palpable marker is right of midline at the area of bulkiest calcification. IMPRESSION: 1. Palpable marker correlates with chronic dystrophic calcification in the anterior neck, likely sequela of remote radiotherapy. No mass or adenopathy in the neck. 2. Evidence of interval right  porta catheter with nonocclusive sheath/web in the right internal jugular to SVC. Electronically Signed   By: JMonte FantasiaM.D.   On: 03/01/2019 10:02   Mr Humerus Left Wo Contrast  Result Date: 02/27/2019 CLINICAL DATA:  Left shoulder and upper arm pain for 2 months. Progressive pain and weakness. Limited range of motion. Sore to touch. EXAM: MRI OF THE LEFT HUMERUS WITHOUT CONTRAST TECHNIQUE: Multiplanar, multisequence MR imaging of the left humerus was performed. No intravenous contrast was administered. COMPARISON:  radiographs dated 10/31/2018 FINDINGS: Bones/Joint/Cartilage The left humerus appears normal. No elbow joint or glenohumeral joint effusions. No significant arthritic changes at the elbow joint or glenohumeral joint. No AC joint arthropathy. Muscles and Tendons Normal. Soft tissues Abnormal signal throughout the left axillary vein and throughout the left basilic vein. This may represent slow flow but the possibility of nonocclusive thrombus should be considered. There is a similar appearance in the cephalic vein. Doppler ultrasound of the left upper arm recommended for definitive evaluation. IMPRESSION: 1. Abnormal signal throughout the left axillary vein and left basilic vein. This may represent slow flow but the possibility of nonocclusive thrombus should be considered. Doppler ultrasound of the left upper arm recommended for definitive evaluation. 2. Otherwise, normal exam. Electronically Signed   By: JLorriane ShireM.D.   On: 02/27/2019 16:27   Mr Shoulder Left Wo Contrast  Result Date: 02/27/2019 CLINICAL DATA:  Left shoulder pain and weakness for the past 2 months. EXAM: MRI OF THE LEFT SHOULDER WITHOUT CONTRAST TECHNIQUE: Multiplanar, multisequence MR imaging of  the shoulder was performed. No intravenous contrast was administered. COMPARISON:  Left humerus x-rays dated October 31, 2018. FINDINGS: Rotator cuff:  Intact rotator cuff. Muscles: No atrophy or abnormal signal of the muscles  of the rotator cuff. Biceps long head: Intact and normally positioned. Mild intra-articular tendinosis. Acromioclavicular Joint: Normal acromioclavicular joint. Type II acromion. No subacromial/subdeltoid bursal fluid. Glenohumeral Joint: Mild partial-thickness cartilage loss. No joint effusion. Labrum: Grossly intact, but evaluation is limited by lack of intraarticular fluid. Bones:  No marrow abnormality, fracture or dislocation. Other: Abnormal signal in the left axillary vein. IMPRESSION: 1. Abnormal signal in the left axillary vein that may represent slow flow, but Doppler ultrasound is recommended to exclude deep venous thrombosis. 2. Intact rotator cuff. 3. Mild intra-articular biceps tendinosis. 4. Mild glenohumeral degenerative changes. Electronically Signed   By: Titus Dubin M.D.   On: 02/27/2019 16:35   US Venous Img Upper Uni Left  Result Date: 02/28/2019 CLINICAL DATA:  Left shoulder pain with MRI demonstrating abnormal signal in the left axillary vein potentially representing slow flow. EXAM: LEFT UPPER EXTREMITY VENOUS DOPPLER ULTRASOUND TECHNIQUE: Gray-scale sonography with graded compression, as well as color Doppler and duplex ultrasound were performed to evaluate the upper extremity deep venous system from the level of the subclavian vein and including the jugular, axillary, basilic, radial, ulnar and upper cephalic vein. Spectral Doppler was utilized to evaluate flow at rest and with distal augmentation maneuvers. COMPARISON:  None. FINDINGS: Contralateral Subclavian Vein: Respiratory phasicity is normal and symmetric with the symptomatic side. No evidence of thrombus. Normal compressibility. Internal Jugular Vein: No evidence of thrombus. Normal compressibility, respiratory phasicity and response to augmentation. Subclavian Vein: No evidence of thrombus. Normal compressibility, respiratory phasicity and response to augmentation. Axillary Vein: Well visualized with no evidence of  thrombus or abnormal flow. Velocities are normal. Normal compressibility, respiratory phasicity and response to augmentation. Cephalic Vein: No evidence of thrombus. Normal compressibility, respiratory phasicity and response to augmentation. Basilic Vein: No evidence of thrombus. Normal compressibility, respiratory phasicity and response to augmentation. Brachial Veins: No evidence of thrombus. Normal compressibility, respiratory phasicity and response to augmentation. Radial Veins: No evidence of thrombus. Normal compressibility, respiratory phasicity and response to augmentation. Ulnar Veins: No evidence of thrombus. Normal compressibility, respiratory phasicity and response to augmentation. Venous Reflux:  None visualized. Other Findings:  None visualized. IMPRESSION: No evidence of DVT within the left upper extremity. The left axillary vein is normally patent by ultrasound and demonstrates normal flow and velocities. Electronically Signed   By: Aletta Edouard M.D.   On: 02/28/2019 13:36     Assessment and plan- Patient is a 63 y.o. 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 Final path showed 5 mm grade 3 Er weakly positive 1-10%, pr negative and her 2 negative tumor.Oncotype came back as high risk.She completed 4 cycles of adjuvant TC chemotherapy in December 2019.  1. Left shoulder pain and neck pain: mri shoulder/ humerus showed no malignancy. Possible low flow in the axillary vein. Doppler negative for dvt. I will refer her to orthopedics at this time and also obtain mri cervical spine. She has had 3 different cancers so far  2. She had a hard swelling and irregular skin lesions in the midline of  her neck. Ct soft tissue neck shows  chronic dystrophic calcifications. No mass or adenopathy. She was also seen by ENT and no worrisome findings on their exam  I will see her back with labs in 2 months   Visit Diagnosis 1. Neck pain   2. Malignant neoplasm of lower-outer quadrant of right breast of female, estrogen receptor negative ([place])      Dr. Randa Evens, MD, MPH [facility] [phone] 03/14/2019 12:41 PM

## 2019-03-18 DIAGNOSIS — M542 Cervicalgia: Secondary | ICD-10-CM | POA: Diagnosis not present

## 2019-03-18 DIAGNOSIS — M7532 Calcific tendinitis of left shoulder: Secondary | ICD-10-CM | POA: Diagnosis not present

## 2019-03-18 DIAGNOSIS — M7582 Other shoulder lesions, left shoulder: Secondary | ICD-10-CM | POA: Diagnosis not present

## 2019-03-18 DIAGNOSIS — M47812 Spondylosis without myelopathy or radiculopathy, cervical region: Secondary | ICD-10-CM | POA: Insufficient documentation

## 2019-03-18 DIAGNOSIS — G545 Neuralgic amyotrophy: Secondary | ICD-10-CM | POA: Insufficient documentation

## 2019-03-21 ENCOUNTER — Other Ambulatory Visit: Payer: Self-pay

## 2019-03-21 DIAGNOSIS — Z20822 Contact with and (suspected) exposure to covid-19: Secondary | ICD-10-CM

## 2019-03-24 LAB — NOVEL CORONAVIRUS, NAA: SARS-CoV-2, NAA: NOT DETECTED

## 2019-03-26 ENCOUNTER — Other Ambulatory Visit: Payer: Self-pay

## 2019-03-26 ENCOUNTER — Ambulatory Visit
Admission: RE | Admit: 2019-03-26 | Discharge: 2019-03-26 | Disposition: A | Discharge status: Home / Self Care | Payer: Medicare HMO | Source: Ambulatory Visit | Attending: Oncology | Admitting: Oncology

## 2019-03-26 DIAGNOSIS — M542 Cervicalgia: Secondary | ICD-10-CM | POA: Insufficient documentation

## 2019-03-26 DIAGNOSIS — M4802 Spinal stenosis, cervical region: Secondary | ICD-10-CM | POA: Diagnosis not present

## 2019-03-26 MED ORDER — GADOBUTROL 1 MMOL/ML IV SOLN
5.0000 mL | Freq: Once | INTRAVENOUS | Status: AC | PRN
  Administered 2019-03-26: 5 mL via INTRAVENOUS

## 2019-04-01 ENCOUNTER — Ambulatory Visit: Payer: Medicare HMO | Admitting: Oncology

## 2019-04-01 ENCOUNTER — Other Ambulatory Visit: Payer: Self-pay

## 2019-04-01 ENCOUNTER — Inpatient Hospital Stay: Payer: Medicare HMO

## 2019-04-01 DIAGNOSIS — M1990 Unspecified osteoarthritis, unspecified site: Secondary | ICD-10-CM | POA: Diagnosis not present

## 2019-04-01 DIAGNOSIS — M25512 Pain in left shoulder: Secondary | ICD-10-CM | POA: Diagnosis not present

## 2019-04-01 DIAGNOSIS — K219 Gastro-esophageal reflux disease without esophagitis: Secondary | ICD-10-CM | POA: Diagnosis not present

## 2019-04-01 DIAGNOSIS — R5383 Other fatigue: Secondary | ICD-10-CM | POA: Diagnosis not present

## 2019-04-01 DIAGNOSIS — C50511 Malignant neoplasm of lower-outer quadrant of right female breast: Secondary | ICD-10-CM | POA: Diagnosis not present

## 2019-04-01 DIAGNOSIS — Z23 Encounter for immunization: Secondary | ICD-10-CM

## 2019-04-01 DIAGNOSIS — R5381 Other malaise: Secondary | ICD-10-CM | POA: Diagnosis not present

## 2019-04-01 DIAGNOSIS — Z171 Estrogen receptor negative status [ER-]: Secondary | ICD-10-CM | POA: Diagnosis not present

## 2019-04-01 DIAGNOSIS — E538 Deficiency of other specified B group vitamins: Secondary | ICD-10-CM

## 2019-04-01 DIAGNOSIS — M542 Cervicalgia: Secondary | ICD-10-CM | POA: Diagnosis not present

## 2019-04-01 DIAGNOSIS — Z9221 Personal history of antineoplastic chemotherapy: Secondary | ICD-10-CM | POA: Diagnosis not present

## 2019-04-01 MED ORDER — CYANOCOBALAMIN 1000 MCG/ML IJ SOLN
1000.0000 ug | INTRAMUSCULAR | Status: DC
  Administered 2019-04-01: 1000 ug via INTRAMUSCULAR
  Filled 2019-04-01: qty 1

## 2019-04-04 ENCOUNTER — Other Ambulatory Visit: Payer: Self-pay

## 2019-04-04 DIAGNOSIS — Z20822 Contact with and (suspected) exposure to covid-19: Secondary | ICD-10-CM

## 2019-04-08 LAB — NOVEL CORONAVIRUS, NAA: SARS-CoV-2, NAA: NOT DETECTED

## 2019-04-18 DIAGNOSIS — R42 Dizziness and giddiness: Secondary | ICD-10-CM | POA: Diagnosis not present

## 2019-04-24 DIAGNOSIS — R42 Dizziness and giddiness: Secondary | ICD-10-CM | POA: Diagnosis not present

## 2019-05-07 ENCOUNTER — Ambulatory Visit (INDEPENDENT_AMBULATORY_CARE_PROVIDER_SITE_OTHER): Payer: Medicare HMO | Admitting: Gastroenterology

## 2019-05-07 ENCOUNTER — Encounter: Payer: Self-pay | Admitting: Gastroenterology

## 2019-05-07 ENCOUNTER — Other Ambulatory Visit: Payer: Self-pay

## 2019-05-07 VITALS — BP 119/79 | HR 78 | Temp 98.1°F | Ht 61.0 in | Wt 126.4 lb

## 2019-05-07 DIAGNOSIS — R1310 Dysphagia, unspecified: Secondary | ICD-10-CM | POA: Diagnosis not present

## 2019-05-07 DIAGNOSIS — R1319 Other dysphagia: Secondary | ICD-10-CM

## 2019-05-07 NOTE — Progress Notes (Signed)
Gastroenterology Consultation  Referring Provider:     Beverly Gust, MD Primary Care Physician:  Margo Common, PA Primary Gastroenterologist:  Dr. Allen Norris     Reason for Consultation:     Dysphagia        HPI:   Denise Watts is a 64 y.o. y/o female referred for consultation & management of dysphagia by Dr. Natale Milch, Vickki Muff, PA.  This patient was sent to me for dysphagia.  The patient has a history of dysphagia and saw Dr. Candace Cruise back in 2015 and she had a barium swallow that showed:  IMPRESSION:  1. There was 1 episode of mild laryngeal penetration of the barium  which did elicit a cough reflex. The patient reports she experiences  occasional similar episodes at home.  2. Fixed narrowing of the lower cervical esophagus which would not  allow passage of the barium tablet consistent with a stricture. The  patient reports that she underwent esophageal stretching last week.  However, the lower cervical esophagus remains abnormally narrowed.  Correlation with the endoscopic findings is needed.  3. Once the lower cervical esophageal stricture has been addressed,  consideration could be given to performing a modified barium swallow  examination to assess swallowing function and adequate functioning  of the epiglottis.  4. The thoracic esophagus is unremarkable.   It appears that the patient underwent a upper endoscopy at an offsite location and those records are not available to me at this time.  The patient has a history of breast cancer and follows up with Dr. Janese Banks.  The patient was seen by ENT in October of last year and recommended to come see me due to a history of an esophageal stricture and dysphagia.  Past Medical History:  Diagnosis Date  . Arthritis    KNEE RIGHT  . Breast cancer [facility]) 1997   right breast lumpectomy with radiation  . Breast cancer ([place]) 11/15/2017   INVASIVE MAMMARY CARCINOMA/ triple negative  . 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 Cough  03/02/2017   INTERMTTENT DRY COUGH  . GERD (gastroesophageal reflux disease)   . History of hiatal hernia   . Personal history of chemotherapy   . Personal history of radiation therapy   . Skin cancer   . Throat cancer [facility]) 1998   radiation    Past Surgical History:  Procedure Laterality Date  . BREAST BIOPSY Right 1997   positive  . BREAST BIOPSY Right 11/15/2017   INVASIVE MAMMARY CARCINOMA triple negative  . BREAST LUMPECTOMY Right 1997   2019 also  . BREAST LUMPECTOMY WITH SENTINEL LYMPH NODE BIOPSY Right 12/08/2017   Procedure: BREAST LUMPECTOMY WITH SENTINEL LYMPH NODE BX;  Surgeon: Robert Bellow, MD;  Location: [facility] ORS;  Service: General;  Laterality: Right;  . COLONOSCOPY  2015  . ESOPHAGUS SURGERY    . KNEE SURGERY Right   . PORT-A-CATH REMOVAL    . PORTACATH PLACEMENT Right 01/17/2018   Procedure: INSERTION PORT-A-CATH- RIGHT;  Surgeon: Robert Bellow, MD;  Location: [facility] ORS;  Service: General;  Laterality: Right;  . ROBOTIC ADRENALECTOMY Right 12/28/2016   Procedure: ROBOTIC ADRENALECTOMY;  Surgeon: Hollice Espy, MD;  Location: [facility] ORS;  Service: Urology;  Laterality: Right;  . SKIN CANCER EXCISION    . THROAT SURGERY  1998   throat cancer   . TUBAL LIGATION    . VENTRAL HERNIA REPAIR N/A 03/03/2017   Procedure: HERNIA REPAIR VENTRAL ADULT;  Surgeon: Clayburn Pert, MD;  Location: [facility] ORS;  Service: General;  Laterality: N/A;    Prior to Admission medications   Medication Sig Start Date End Date Taking? Authorizing Provider  Cyanocobalamin (B-12 COMPLIANCE INJECTION IJ) Inject 1 Dose as directed every 30 (thirty) days.    [provider]  DULoxetine (CYMBALTA) 30 MG capsule Take 30 mg by mouth daily.    [provider]  naproxen sodium (ALEVE) 220 MG tablet Take 220 mg by mouth daily as needed (pain).     [provider]  omeprazole (PRILOSEC) 40 MG capsule TAKE 1 CAPSULE BY MOUTH EVERY DAY (MORNING) 10/12/18   Chrismon,  Vickki Muff, PA    Family History  Problem Relation Age of Onset  . Diabetes Mother   . Heart attack Mother   . Cancer Sister        cancer of eyes, spread to lung, other places. died at 32  . Cancer Sister 79       unknown primary, spread to bone, brain died in 21's  . Cancer Paternal Aunt        unk type  . Cancer Paternal Grandmother        type unk  . Prostate cancer Neg Hx   . Kidney cancer Neg Hx   . Bladder Cancer Neg Hx      Social History   Tobacco Use  . Smoking status: Former Smoker    Packs/day: 1.50    Years: 30.00    Pack years: 45.00    Types: Cigarettes    Quit date: 05/02/1996    Years since quitting: 23.0  . Smokeless tobacco: Never Used  Substance Use Topics  . Alcohol use: Not Currently    Alcohol/week: 0.0 standard drinks  . Drug use: No    Allergies as of 05/07/2019  . (No Known Allergies)    Review of Systems:    All systems reviewed and negative except where noted in HPI.   Physical Exam:  There were no vitals taken for this visit. No LMP recorded. Patient is postmenopausal. General:   Alert,  Well-developed, well-nourished, pleasant and cooperative in NAD Head:  Normocephalic and atraumatic. Eyes:  Sclera clear, no icterus.   Conjunctiva pink. Ears:  Normal auditory acuity. Neck:  Supple; no masses or thyromegaly. Lungs:  Respirations even and unlabored.  Clear throughout to auscultation.   No wheezes, crackles, or rhonchi. No acute distress. Heart:  Regular rate and rhythm; no murmurs, clicks, rubs, or gallops. Abdomen:  Normal bowel sounds.  No bruits.  Soft, non-tender and non-distended without masses, hepatosplenomegaly or hernias noted.  No guarding or rebound tenderness.  Negative Carnett sign.   Rectal:  Deferred.  Msk:  Symmetrical without gross deformities.  Good, equal movement & strength bilaterally. Pulses:  Normal pulses noted. Extremities:  No clubbing or edema.  No cyanosis. Neurologic:  Alert and oriented x3;  grossly  normal neurologically. Skin:  Intact without significant lesions or rashes.  No jaundice. Lymph Nodes:  No significant cervical adenopathy. Psych:  Alert and cooperative. Normal mood and affect.  Imaging Studies: No results found.  Assessment and Plan:   Denise Watts is a 64 y.o. y/o female who has a history of esophageal stricture who now reports that she has not had any unexplained weight loss but does have trouble swallowing.  She reports that it is like when she previously had to have a dilation of her esophagus but she thinks may be even slightly worse.  The patient will be  set up for an EGD.  The patient has been explained the plan and agrees with it.    Lucilla Lame, MD. Marval Regal    Note: This dictation was prepared with Dragon dictation along with smaller phrase technology. Any transcriptional errors that result from this process are unintentional.

## 2019-05-07 NOTE — H&P (View-Only) (Signed)
Gastroenterology Consultation  Referring Provider:     Beverly Gust, MD Primary Care Physician:  Margo Common, PA Primary Gastroenterologist:  Dr. Allen Norris     Reason for Consultation:     Dysphagia        HPI:   Denise Watts is a 64 y.o. y/o female referred for consultation & management of dysphagia by Dr. Natale Milch, Vickki Muff, PA.  This patient was sent to me for dysphagia.  The patient has a history of dysphagia and saw Dr. Candace Cruise back in 2015 and she had a barium swallow that showed:  IMPRESSION:  1. There was 1 episode of mild laryngeal penetration of the barium  which did elicit a cough reflex. The patient reports she experiences  occasional similar episodes at home.  2. Fixed narrowing of the lower cervical esophagus which would not  allow passage of the barium tablet consistent with a stricture. The  patient reports that she underwent esophageal stretching last week.  However, the lower cervical esophagus remains abnormally narrowed.  Correlation with the endoscopic findings is needed.  3. Once the lower cervical esophageal stricture has been addressed,  consideration could be given to performing a modified barium swallow  examination to assess swallowing function and adequate functioning  of the epiglottis.  4. The thoracic esophagus is unremarkable.   It appears that the patient underwent a upper endoscopy at an offsite location and those records are not available to me at this time.  The patient has a history of breast cancer and follows up with Dr. Janese Banks.  The patient was seen by ENT in October of last year and recommended to come see me due to a history of an esophageal stricture and dysphagia.  Past Medical History:  Diagnosis Date  . Arthritis    KNEE RIGHT  . Breast cancer [facility]) 1997   right breast lumpectomy with radiation  . Breast cancer ([place]) 11/15/2017   INVASIVE MAMMARY CARCINOMA/ triple negative  . 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 Cough  03/02/2017   INTERMTTENT DRY COUGH  . GERD (gastroesophageal reflux disease)   . History of hiatal hernia   . Personal history of chemotherapy   . Personal history of radiation therapy   . Skin cancer   . Throat cancer [facility]) 1998   radiation    Past Surgical History:  Procedure Laterality Date  . BREAST BIOPSY Right 1997   positive  . BREAST BIOPSY Right 11/15/2017   INVASIVE MAMMARY CARCINOMA triple negative  . BREAST LUMPECTOMY Right 1997   2019 also  . BREAST LUMPECTOMY WITH SENTINEL LYMPH NODE BIOPSY Right 12/08/2017   Procedure: BREAST LUMPECTOMY WITH SENTINEL LYMPH NODE BX;  Surgeon: Robert Bellow, MD;  Location: [facility] ORS;  Service: General;  Laterality: Right;  . COLONOSCOPY  2015  . ESOPHAGUS SURGERY    . KNEE SURGERY Right   . PORT-A-CATH REMOVAL    . PORTACATH PLACEMENT Right 01/17/2018   Procedure: INSERTION PORT-A-CATH- RIGHT;  Surgeon: Robert Bellow, MD;  Location: [facility];  Service: General;  Laterality: Right;  . ROBOTIC ADRENALECTOMY Right 12/28/2016   Procedure: ROBOTIC ADRENALECTOMY;  Surgeon: Hollice Espy, MD;  Location: [facility] ORS;  Service: Urology;  Laterality: Right;  . SKIN CANCER EXCISION    . THROAT SURGERY  1998   throat cancer   . TUBAL LIGATION    . VENTRAL HERNIA REPAIR N/A 03/03/2017   Procedure: HERNIA REPAIR VENTRAL ADULT;  Surgeon: Clayburn Pert, MD;  Location: [facility] ORS;  Service: General;  Laterality: N/A;    Prior to Admission medications   Medication Sig Start Date End Date Taking? Authorizing Provider  Cyanocobalamin (B-12 COMPLIANCE INJECTION IJ) Inject 1 Dose as directed every 30 (thirty) days.    [provider]  DULoxetine (CYMBALTA) 30 MG capsule Take 30 mg by mouth daily.    [provider]  naproxen sodium (ALEVE) 220 MG tablet Take 220 mg by mouth daily as needed (pain).     [provider]  omeprazole (PRILOSEC) 40 MG capsule TAKE 1 CAPSULE BY MOUTH EVERY DAY (MORNING) 10/12/18   Chrismon,  Vickki Muff, PA    Family History  Problem Relation Age of Onset  . Diabetes Mother   . Heart attack Mother   . Cancer Sister        cancer of eyes, spread to lung, other places. died at 76  . Cancer Sister 51       unknown primary, spread to bone, brain died in 70's  . Cancer Paternal Aunt        unk type  . Cancer Paternal Grandmother        type unk  . Prostate cancer Neg Hx   . Kidney cancer Neg Hx   . Bladder Cancer Neg Hx      Social History   Tobacco Use  . Smoking status: Former Smoker    Packs/day: 1.50    Years: 30.00    Pack years: 45.00    Types: Cigarettes    Quit date: 05/02/1996    Years since quitting: 23.0  . Smokeless tobacco: Never Used  Substance Use Topics  . Alcohol use: Not Currently    Alcohol/week: 0.0 standard drinks  . Drug use: No    Allergies as of 05/07/2019  . (No Known Allergies)    Review of Systems:    All systems reviewed and negative except where noted in HPI.   Physical Exam:  There were no vitals taken for this visit. No LMP recorded. Patient is postmenopausal. General:   Alert,  Well-developed, well-nourished, pleasant and cooperative in NAD Head:  Normocephalic and atraumatic. Eyes:  Sclera clear, no icterus.   Conjunctiva pink. Ears:  Normal auditory acuity. Neck:  Supple; no masses or thyromegaly. Lungs:  Respirations even and unlabored.  Clear throughout to auscultation.   No wheezes, crackles, or rhonchi. No acute distress. Heart:  Regular rate and rhythm; no murmurs, clicks, rubs, or gallops. Abdomen:  Normal bowel sounds.  No bruits.  Soft, non-tender and non-distended without masses, hepatosplenomegaly or hernias noted.  No guarding or rebound tenderness.  Negative Carnett sign.   Rectal:  Deferred.  Msk:  Symmetrical without gross deformities.  Good, equal movement & strength bilaterally. Pulses:  Normal pulses noted. Extremities:  No clubbing or edema.  No cyanosis. Neurologic:  Alert and oriented x3;  grossly  normal neurologically. Skin:  Intact without significant lesions or rashes.  No jaundice. Lymph Nodes:  No significant cervical adenopathy. Psych:  Alert and cooperative. Normal mood and affect.  Imaging Studies: No results found.  Assessment and Plan:   Denise Watts is a 64 y.o. y/o female who has a history of esophageal stricture who now reports that she has not had any unexplained weight loss but does have trouble swallowing.  She reports that it is like when she previously had to have a dilation of her esophagus but she thinks may be even slightly worse.  The patient will be  set up for an EGD.  The patient has been explained the plan and agrees with it.    Lucilla Lame, MD. Marval Regal    Note: This dictation was prepared with Dragon dictation along with smaller phrase technology. Any transcriptional errors that result from this process are unintentional.

## 2019-05-12 ENCOUNTER — Other Ambulatory Visit: Payer: Self-pay | Admitting: *Deleted

## 2019-05-12 DIAGNOSIS — C50511 Malignant neoplasm of lower-outer quadrant of right female breast: Secondary | ICD-10-CM

## 2019-05-12 DIAGNOSIS — C7491 Malignant neoplasm of unspecified part of right adrenal gland: Secondary | ICD-10-CM

## 2019-05-13 ENCOUNTER — Other Ambulatory Visit: Payer: Medicare HMO

## 2019-05-13 ENCOUNTER — Other Ambulatory Visit: Payer: Self-pay

## 2019-05-13 ENCOUNTER — Inpatient Hospital Stay: Payer: Medicare HMO | Admitting: Oncology

## 2019-05-13 ENCOUNTER — Telehealth: Payer: Self-pay

## 2019-05-13 ENCOUNTER — Inpatient Hospital Stay: Payer: Medicare HMO | Attending: Oncology

## 2019-05-13 ENCOUNTER — Encounter: Payer: Self-pay | Admitting: Oncology

## 2019-05-13 DIAGNOSIS — Z79899 Other long term (current) drug therapy: Secondary | ICD-10-CM | POA: Insufficient documentation

## 2019-05-13 DIAGNOSIS — C50511 Malignant neoplasm of lower-outer quadrant of right female breast: Secondary | ICD-10-CM

## 2019-05-13 DIAGNOSIS — Z853 Personal history of malignant neoplasm of breast: Secondary | ICD-10-CM | POA: Insufficient documentation

## 2019-05-13 DIAGNOSIS — Z9221 Personal history of antineoplastic chemotherapy: Secondary | ICD-10-CM | POA: Diagnosis not present

## 2019-05-13 DIAGNOSIS — M79602 Pain in left arm: Secondary | ICD-10-CM | POA: Diagnosis not present

## 2019-05-13 DIAGNOSIS — I2510 Atherosclerotic heart disease of native coronary artery without angina pectoris: Secondary | ICD-10-CM | POA: Insufficient documentation

## 2019-05-13 DIAGNOSIS — R911 Solitary pulmonary nodule: Secondary | ICD-10-CM | POA: Insufficient documentation

## 2019-05-13 DIAGNOSIS — R5381 Other malaise: Secondary | ICD-10-CM | POA: Diagnosis not present

## 2019-05-13 DIAGNOSIS — Z85828 Personal history of other malignant neoplasm of skin: Secondary | ICD-10-CM | POA: Diagnosis not present

## 2019-05-13 DIAGNOSIS — Z171 Estrogen receptor negative status [ER-]: Secondary | ICD-10-CM | POA: Insufficient documentation

## 2019-05-13 DIAGNOSIS — K219 Gastro-esophageal reflux disease without esophagitis: Secondary | ICD-10-CM | POA: Diagnosis not present

## 2019-05-13 DIAGNOSIS — E538 Deficiency of other specified B group vitamins: Secondary | ICD-10-CM | POA: Diagnosis not present

## 2019-05-13 DIAGNOSIS — I700 Atherosclerosis of aorta: Secondary | ICD-10-CM | POA: Diagnosis not present

## 2019-05-13 DIAGNOSIS — Z923 Personal history of irradiation: Secondary | ICD-10-CM | POA: Diagnosis not present

## 2019-05-13 DIAGNOSIS — Z87891 Personal history of nicotine dependence: Secondary | ICD-10-CM | POA: Insufficient documentation

## 2019-05-13 DIAGNOSIS — C7971 Secondary malignant neoplasm of right adrenal gland: Secondary | ICD-10-CM | POA: Insufficient documentation

## 2019-05-13 DIAGNOSIS — Z8521 Personal history of malignant neoplasm of larynx: Secondary | ICD-10-CM | POA: Diagnosis not present

## 2019-05-13 DIAGNOSIS — M1990 Unspecified osteoarthritis, unspecified site: Secondary | ICD-10-CM | POA: Diagnosis not present

## 2019-05-13 DIAGNOSIS — R5383 Other fatigue: Secondary | ICD-10-CM | POA: Insufficient documentation

## 2019-05-13 DIAGNOSIS — K760 Fatty (change of) liver, not elsewhere classified: Secondary | ICD-10-CM | POA: Insufficient documentation

## 2019-05-13 DIAGNOSIS — C7491 Malignant neoplasm of unspecified part of right adrenal gland: Secondary | ICD-10-CM

## 2019-05-13 DIAGNOSIS — M542 Cervicalgia: Secondary | ICD-10-CM

## 2019-05-13 LAB — CBC WITH DIFFERENTIAL/PLATELET
Abs Immature Granulocytes: 0.03 10*3/uL (ref 0.00–0.07)
Basophils Absolute: 0 10*3/uL (ref 0.0–0.1)
Basophils Relative: 1 %
Eosinophils Absolute: 0.2 10*3/uL (ref 0.0–0.5)
Eosinophils Relative: 4 %
HCT: 44 % (ref 36.0–46.0)
Hemoglobin: 13.9 g/dL (ref 12.0–15.0)
Immature Granulocytes: 1 %
Lymphocytes Relative: 19 %
Lymphs Abs: 1.1 10*3/uL (ref 0.7–4.0)
MCH: 32.9 pg (ref 26.0–34.0)
MCHC: 31.6 g/dL (ref 30.0–36.0)
MCV: 104 fL — ABNORMAL HIGH (ref 80.0–100.0)
Monocytes Absolute: 0.5 10*3/uL (ref 0.1–1.0)
Monocytes Relative: 9 %
Neutro Abs: 4 10*3/uL (ref 1.7–7.7)
Neutrophils Relative %: 66 %
Platelets: 209 10*3/uL (ref 150–400)
RBC: 4.23 MIL/uL (ref 3.87–5.11)
RDW: 12.4 % (ref 11.5–15.5)
WBC: 5.9 10*3/uL (ref 4.0–10.5)
nRBC: 0 % (ref 0.0–0.2)

## 2019-05-13 LAB — COMPREHENSIVE METABOLIC PANEL
ALT: 12 U/L (ref 0–44)
AST: 18 U/L (ref 15–41)
Albumin: 4 g/dL (ref 3.5–5.0)
Alkaline Phosphatase: 60 U/L (ref 38–126)
Anion gap: 9 (ref 5–15)
BUN: 8 mg/dL (ref 8–23)
CO2: 28 mmol/L (ref 22–32)
Calcium: 9.2 mg/dL (ref 8.9–10.3)
Chloride: 102 mmol/L (ref 98–111)
Creatinine, Ser: 0.68 mg/dL (ref 0.44–1.00)
GFR calc Af Amer: >60 mL/min (ref >60)
GFR calc non Af Amer: >60 mL/min (ref >60)
Glucose, Bld: 88 mg/dL (ref 70–99)
Potassium: 4.3 mmol/L (ref 3.5–5.1)
Sodium: 139 mmol/L (ref 135–145)
Total Bilirubin: 0.7 mg/dL (ref 0.3–1.2)
Total Protein: 6.7 g/dL (ref 6.5–8.1)

## 2019-05-13 NOTE — Telephone Encounter (Signed)

## 2019-05-13 NOTE — Progress Notes (Unsigned)
Patient stated that she had been having some pain on her right breast even though she has had an injection to block her pain by Dr. Dahlia Byes. Patient stated that tomorrow she will be seeing Dr. Marla Roe to discuss surgery for a right breast implant.

## 2019-05-14 ENCOUNTER — Ambulatory Visit: Payer: Medicare HMO | Admitting: Plastic Surgery

## 2019-05-14 ENCOUNTER — Encounter: Payer: Self-pay | Admitting: Plastic Surgery

## 2019-05-14 VITALS — BP 103/66 | HR 93 | Temp 98.4°F | Ht 61.0 in | Wt 126.6 lb

## 2019-05-14 DIAGNOSIS — N6489 Other specified disorders of breast: Secondary | ICD-10-CM

## 2019-05-14 DIAGNOSIS — Z9011 Acquired absence of right breast and nipple: Secondary | ICD-10-CM | POA: Diagnosis not present

## 2019-05-14 NOTE — Progress Notes (Addendum)
   Subjective:    Patient ID: Denise Watts, female    DOB: 02-22-1956, 64 y.o.   MRN: [id number]  The patient is a 64 year old female here for further consultation for breast reconstruction.  She underwent a partial mastectomy in 1997 and was radiated.  She then had a recurrence and underwent another partial mastectomy with repeat radiation in the last 2 years.  She was also radiated for throat cancer.  She is 5 feet 1 inches tall and weighs 126 pounds.  Her bra size is ~ 36 B/C.  She has a strong history of tobacco use but is not currently smoking.  The right breast at the lateral inferior quadrant is extremely tight and contracted.  It is difficult to keep clean and she has severe asymmetry secondary to the the contracture and loss of volume.  The left breast has grade 2 ptosis.   Review of Systems  Constitutional: Negative.  Negative for activity change.  Eyes: Negative.   Respiratory: Positive for chest tightness.        On the right breast from the radiation  Cardiovascular: Negative.   Gastrointestinal: Negative.   Endocrine: Negative.   Genitourinary: Negative.   Musculoskeletal: Negative.   Skin: Positive for color change. Negative for wound.  Hematological: Negative.   Psychiatric/Behavioral: Negative.        Objective:   Physical Exam Vitals and nursing note reviewed.  Constitutional:      Appearance: Normal appearance.  HENT:     Head: Normocephalic and atraumatic.  Eyes:     Extraocular Movements: Extraocular movements intact.  Cardiovascular:     Rate and Rhythm: Normal rate.     Pulses: Normal pulses.  Pulmonary:     Effort: Pulmonary effort is normal.  Chest:    Abdominal:     General: Abdomen is flat. There is no distension.  Neurological:     General: No focal deficit present.     Mental Status: She is alert and oriented to person, place, and time.  Psychiatric:        Mood and Affect: Mood normal.        Behavior: Behavior normal.        Thought  Content: Thought content normal.        Assessment & Plan:     ICD-10-CM   1. Status post partial mastectomy of right breast  Z90.11   2. Postoperative breast asymmetry  N64.89     Extreme caution will need to be exercise due to not only 1 episode of radiation but two.  Recommend release of scar contracture of the right breast with fat filling in order to prevent it from happening again.  Then for symmetry surgery with a left breast reduction.  Pictures were obtained of the patient and placed in the chart with the patient's or guardian's permission.   The [place] was signed into law in 2016 which includes the topic of electronic health records.  This provides immediate access to information in MyChart.  This includes consultation notes, operative notes, office notes, lab results and pathology reports.  If you have any questions about what you read please let us know at your next visit or call us at the office.  We are right here with you.

## 2019-05-16 ENCOUNTER — Encounter: Payer: Self-pay | Admitting: Oncology

## 2019-05-16 ENCOUNTER — Other Ambulatory Visit: Payer: Self-pay

## 2019-05-16 NOTE — Progress Notes (Signed)
Patient stated that she had been having abdominal soreness and tenderness for several months that keep getting worse. Patient also wanted to know when she could get her Vitamin B12 injection. Patient also went to see Dr. Marla Roe and at the end of the visit, all they are waiting for is for her insurance to approve the surgery.

## 2019-05-17 ENCOUNTER — Inpatient Hospital Stay (HOSPITAL_BASED_OUTPATIENT_CLINIC_OR_DEPARTMENT_OTHER): Payer: Medicare HMO | Admitting: Oncology

## 2019-05-17 DIAGNOSIS — E538 Deficiency of other specified B group vitamins: Secondary | ICD-10-CM | POA: Diagnosis not present

## 2019-05-17 DIAGNOSIS — C320 Malignant neoplasm of glottis: Secondary | ICD-10-CM | POA: Diagnosis not present

## 2019-05-17 DIAGNOSIS — C7491 Malignant neoplasm of unspecified part of right adrenal gland: Secondary | ICD-10-CM | POA: Diagnosis not present

## 2019-05-17 DIAGNOSIS — Z171 Estrogen receptor negative status [ER-]: Secondary | ICD-10-CM

## 2019-05-17 DIAGNOSIS — C50511 Malignant neoplasm of lower-outer quadrant of right female breast: Secondary | ICD-10-CM

## 2019-05-20 NOTE — Progress Notes (Signed)
I connected with Denise Watts on 05/20/19 at  8:30 AM EST by video enabled telemedicine visit and verified that I am speaking with the correct person using two identifiers.   I discussed the limitations, risks, security and privacy concerns of performing an evaluation and management service by telemedicine and the availability of in-person appointments. I also discussed with the patient that there may be a patient responsible charge related to this service. The patient expressed understanding and agreed to proceed.  There were problems encountered during the video call due to poor connection and had to be switched to a telephone call  Other persons participating in the visit and their role in the encounter:  none  Patient's location:  home Provider's location:  Work  Diagnosis- 1.H/o breast cancer in 1997 2. H/o stage I SCC of vocal cord in 2015. 3. Lung nodules4. Adrenal carcinoma of unknown primary s/p resection5. Stage IaT1a NxcM0 ER weakly positive 1-10%, PR negative and her 2 negative   Chief Complaint: Routine follow-up for history of breast cancer and renal cancer and vocal cord cancer  History of present illness:  Oncology History Overview Note  1. Carcinoma of breast, T1, N0, M0 tumor diagnosis.  In January of 1997 2. Carcinoma of the vocal cord T1, N0, M0 tumor.  Status postradiation therapy 3. Abnormal CT scan of the chest (December, 2015) 4. Repeat CT scan of chest shows stable nodule (July, 2016) 5. Neutropenia with normal hemoglobin and normal platelet count (July, 2016)   Cancer of vocal cord [facility])  11/10/2014 Initial Diagnosis   Cancer of vocal cord   Malignant neoplasm of lower-outer quadrant of right breast of female, estrogen receptor negative ([place])  11/24/2017 Initial Diagnosis   Malignant neoplasm of lower-outer quadrant of right breast of female, estrogen receptor negative ([place])   01/12/2018 Cancer Staging   Staging form: Breast, AJCC 8th Edition - Clinical  stage from 01/12/2018: Stage IB (cT1a, cN0, cM0, G3, ER+, PR-, HER2-) - Signed by Sindy Guadeloupe, MD on 01/12/2018   01/30/2018 -  Chemotherapy   The patient had palonosetron (ALOXI) injection 0.25 mg, 0.25 mg, Intravenous,  Once, 4 of 4 cycles Administration: 0.25 mg (01/30/2018), 0.25 mg (02/27/2018), 0.25 mg (03/28/2018), 0.25 mg (04/26/2018) pegfilgrastim (NEULASTA ONPRO KIT) injection 6 mg, 6 mg, Subcutaneous, Once, 4 of 4 cycles Administration: 6 mg (01/30/2018), 6 mg (02/27/2018), 6 mg (03/28/2018), 6 mg (04/26/2018) cyclophosphamide (CYTOXAN) 1,000 mg in sodium chloride 0.9 % 250 mL chemo infusion, 600 mg/m2 = 1,000 mg, Intravenous,  Once, 4 of 4 cycles Administration: 1,000 mg (01/30/2018), 1,000 mg (02/27/2018), 1,000 mg (03/28/2018), 1,000 mg (04/26/2018) DOCEtaxel (TAXOTERE) 130 mg in sodium chloride 0.9 % 250 mL chemo infusion, 75 mg/m2 = 130 mg, Intravenous,  Once, 4 of 4 cycles Dose modification: 60 mg/m2 (original dose 75 mg/m2, Cycle 2, Reason: Dose not tolerated) Administration: 130 mg (01/30/2018), 100 mg (02/27/2018), 100 mg (03/28/2018), 100 mg (04/26/2018)  for chemotherapy treatment.     The patient was originally diagnosed with right breast cancer in 1997,10 mm grade 3, ER pr positive her 2 negative, node negativestatus post surgery and radiation treatment only. She never received chemotherapy or hormone therapy. For her vocal cord cancer, she had extensive surgery and radiation therapy in 1998. Due to past history of smoking, she was also noted to have lung nodules. She is undergoing active surveillance imaging study of the chest.  Patient noted to have adrenal mass on CT chest which prompted PET CT in July 2018  which showed:IMPRESSION: 1. Right adrenal mass with peripheral hypermetabolism. Given interval development since 11/18/2015, suspicious for either isolated metastasis or a primary adrenal neoplasm. This should be considered for biopsy. 2. No evidence of  hypermetabolic primary malignancy or extraadrenal metastasis. Pulmonary nodules are not significantly hypermetabolic. 3. Coronary artery atherosclerosis. Aortic Atherosclerosis (ICD10-I70.0). 4. Hepatic steatosis. 5. Left sixth rib hypermetabolism is favored to be related to remote Trauma.  Patient underwent right adrenalectomy which showed:DIAGNOSIS:  A. ADRENAL GLAND, RIGHT; ADRENALECTOMY:  - HIGH GRADE CARCINOMA WITH EXTENSIVE NECROSIS.  - MARGINS OF THE SPECIMEN ARE NEGATIVE FOR CARCINOMA.   Comment:  Sections demonstrate abundant necrosis confined to the adrenal medullary  compartment. In one block of tissue a small fragment of viable malignant  neoplasm was identified. A panel of immunohistochemical stains was  performed with the following pattern of immunoreactivity:  Super pancytokeratin: Positive  GATA-3: Negative  TTF-1: Negative  P40: Negative  Napsin: Negative  PAX-8: Negative  Chromogranin: Negative   Repeat CT chest abdomen and pelvis in July 2019 showed stable left lower lobe pulmonary nodule. No other evidence of metastatic disease. New 4.5 mm nodule in the right breast. This was followed by ultrasound mammogram and core biopsy of the breast lesion which was a grade 3 triple negative breast cancer. Patient was seen by Dr. Bary Castilla and underwent lumpectomy on 12/08/2017.  Patient had a 5 mm weakly ER positive tumor. Given her ongoing fatigue Oncotype testing was done to see if she could potentially avoid chemotherapy. Oncotype shows a recurrence score of 53 with her risk of distant recurrence at 9 years at greater than 39% with hormone therapyalone.Absolute benefit of chemotherapy was greater than 15% in her age group.  Patient completed 4 cycles of adjuvant TC chemotherapy in December 2019  Interval history: Patient continues to have problems with left arm pain for which she sees orthopedics.  She also reported difficulty swallowing and will be undergoing  endoscopy with Dr. Allen Norris   Review of Systems  Constitutional: Positive for malaise/fatigue. Negative for chills, fever and weight loss.  HENT: Negative for congestion, ear discharge and nosebleeds.   Eyes: Negative for blurred vision.  Respiratory: Negative for cough, hemoptysis, sputum production, shortness of breath and wheezing.   Cardiovascular: Negative for chest pain, palpitations, orthopnea and claudication.  Gastrointestinal: Negative for abdominal pain, blood in stool, constipation, diarrhea, heartburn, melena, nausea and vomiting.       Difficulty swallowing  Genitourinary: Negative for dysuria, flank pain, frequency, hematuria and urgency.  Musculoskeletal: Positive for joint pain. Negative for back pain and myalgias.  Skin: Negative for rash.  Neurological: Negative for dizziness, tingling, focal weakness, seizures, weakness and headaches.  Endo/Heme/Allergies: Does not bruise/bleed easily.  Psychiatric/Behavioral: Negative for depression and suicidal ideas. The patient does not have insomnia.     No Known Allergies  Past Medical History:  Diagnosis Date  . Arthritis    KNEE RIGHT  . Breast cancer [facility]) 1997   right breast lumpectomy with radiation  . Breast cancer ([place]) 11/15/2017   INVASIVE MAMMARY CARCINOMA/ triple negative  . 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[place]) 11/10/2014  . Cough 03/02/2017   INTERMTTENT DRY COUGH  . GERD (gastroesophageal reflux disease)   . History of hiatal hernia   . Personal history of chemotherapy   . Personal history of radiation therapy   . Skin cancer   . Throat cancer [facility]) 1998   radiation    Past Surgical History:  Procedure Laterality Date  . BREAST BIOPSY Right  1997   positive  . BREAST BIOPSY Right 11/15/2017   INVASIVE MAMMARY CARCINOMA triple negative  . BREAST LUMPECTOMY Right 1997   2019 also  . BREAST LUMPECTOMY WITH SENTINEL LYMPH NODE BIOPSY Right 12/08/2017   Procedure: BREAST LUMPECTOMY WITH SENTINEL LYMPH NODE BX;   Surgeon: Robert Bellow, MD;  Location: [facility] ORS;  Service: General;  Laterality: Right;  . COLONOSCOPY  2015  . ESOPHAGUS SURGERY    . KNEE SURGERY Right   . PORT-A-CATH REMOVAL    . PORTACATH PLACEMENT Right 01/17/2018   Procedure: INSERTION PORT-A-CATH- RIGHT;  Surgeon: Robert Bellow, MD;  Location: [facility] ORS;  Service: General;  Laterality: Right;  . ROBOTIC ADRENALECTOMY Right 12/28/2016   Procedure: ROBOTIC ADRENALECTOMY;  Surgeon: Hollice Espy, MD;  Location: [facility] ORS;  Service: Urology;  Laterality: Right;  . SKIN CANCER EXCISION    . THROAT SURGERY  1998   throat cancer   . TUBAL LIGATION    . VENTRAL HERNIA REPAIR N/A 03/03/2017   Procedure: HERNIA REPAIR VENTRAL ADULT;  Surgeon: Clayburn Pert, MD;  Location: [facility] ORS;  Service: General;  Laterality: N/A;    Social History   Socioeconomic History  . Marital status: Married    Spouse name: Not on file  . Number of children: Not on file  . Years of education: Not on file  . Highest education level: Not on file  Occupational History  . Not on file  Tobacco Use  . Smoking status: Former Smoker    Packs/day: 1.50    Years: 30.00    Pack years: 45.00    Types: Cigarettes    Quit date: 05/02/1996    Years since quitting: 23.0  . Smokeless tobacco: Never Used  Substance and Sexual Activity  . Alcohol use: Not Currently    Alcohol/week: 0.0 standard drinks  . Drug use: No  . Sexual activity: Not on file  Other Topics Concern  . Not on file  Social History Narrative  . Not on file   Social Determinants of Health   Financial Resource Strain:   . Difficulty of Paying Living Expenses: Not on file  Food Insecurity:   . Worried About Charity fundraiser in the Last Year: Not on file  . Ran Out of Food in the Last Year: Not on file  Transportation Needs:   . Lack of Transportation (Medical): Not on file  . Lack of Transportation (Non-Medical): Not on file  Physical Activity:   . Days of Exercise per Week:  Not on file  . Minutes of Exercise per Session: Not on file  Stress:   . Feeling of Stress : Not on file  Social Connections:   . Frequency of Communication with Friends and Family: Not on file  . Frequency of Social Gatherings with Friends and Family: Not on file  . Attends Religious Services: Not on file  . Active Member of Clubs or Organizations: Not on file  . Attends Archivist Meetings: Not on file  . Marital Status: Not on file  Intimate Partner Violence:   . Fear of Current or Ex-Partner: Not on file  . Emotionally Abused: Not on file  . Physically Abused: Not on file  . Sexually Abused: Not on file    Family History  Problem Relation Age of Onset  . Diabetes Mother   . Heart attack Mother   . Cancer Sister        cancer of eyes, spread to lung, other  places. died at 82  . Cancer Sister 43       unknown primary, spread to bone, brain died in 42's  . Cancer Paternal Aunt        unk type  . Cancer Paternal Grandmother        type unk  . Prostate cancer Neg Hx   . Kidney cancer Neg Hx   . Bladder Cancer Neg Hx      Current Outpatient Medications:  .  Cyanocobalamin (B-12 COMPLIANCE INJECTION IJ), Inject 1 Dose as directed every 30 (thirty) days., Disp: , Rfl:  .  DULoxetine (CYMBALTA) 30 MG capsule, Take 30 mg by mouth daily., Disp: , Rfl:  .  naproxen sodium (ALEVE) 220 MG tablet, Take 220 mg by mouth daily as needed (pain). , Disp: , Rfl:  .  omeprazole (PRILOSEC) 40 MG capsule, TAKE 1 CAPSULE BY MOUTH EVERY DAY (MORNING), Disp: 30 capsule, Rfl: 11 No current facility-administered medications for this visit.  Facility-Administered Medications Ordered in Other Visits:  .  cyanocobalamin ((VITAMIN B-12)) injection 1,000 mcg, 1,000 mcg, Intramuscular, Q30 days, Sindy Guadeloupe, MD, 1,000 mcg at 08/03/18 0936 .  cyanocobalamin ((VITAMIN B-12)) injection 1,000 mcg, 1,000 mcg, Intramuscular, Q30 days, Sindy Guadeloupe, MD, 1,000 mcg at 09/28/18 1013 .   cyanocobalamin ((VITAMIN B-12)) injection 1,000 mcg, 1,000 mcg, Intramuscular, Q30 days, Sindy Guadeloupe, MD, 1,000 mcg at 11/30/18 1355  No results found.  No images are attached to the encounter.   CMP Latest Ref Rng & Units 05/13/2019  Glucose 70 - 99 mg/dL 88  BUN 8 - 23 mg/dL 8  Creatinine 0.44 - 1.00 mg/dL 0.68  Sodium 135 - 145 mmol/L 139  Potassium 3.5 - 5.1 mmol/L 4.3  Chloride 98 - 111 mmol/L 102  CO2 22 - 32 mmol/L 28  Calcium 8.9 - 10.3 mg/dL 9.2  Total Protein 6.5 - 8.1 g/dL 6.7  Total Bilirubin 0.3 - 1.2 mg/dL 0.7  Alkaline Phos 38 - 126 U/L 60  AST 15 - 41 U/L 18  ALT 0 - 44 U/L 12   CBC Latest Ref Rng & Units 05/13/2019  WBC 4.0 - 10.5 K/uL 5.9  Hemoglobin 12.0 - 15.0 g/dL 13.9  Hematocrit 36.0 - 46.0 % 44.0  Platelets 150 - 400 K/uL 209   Assessment and plan:Patient is a 64 y.o. 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 Final path showed 5 mm grade 3 Er weakly positive 1-10%, pr negative and her 2 negative tumor.Oncotype came back as high risk.  She completed 4 cycles of adjuvant TC chemotherapy in December 2019 this is a routine follow-up visit for following issues:  1.  Right breast cancer: She felt poorly after chemotherapy and had multiple medical issues due to which she has not been on hormone therapy for weakly ER positive breast cancer which was behaving more like triple negative breast cancer. Her most recent mammogram from August 2020 did not reveal any evidence of malignancy.  Continue to monitor  2.  History of lung nodules: I will be repeating CT chest abdomen pelvis with contrast at this time  3.  History of adrenal cancer.  This is s/p resection and her last scan from July 2020 did not reveal  any evidence of malignancy  4.  Left arm pain: Etiology unclear likely musculoskeletal.  MRI scans and x-rays have not revealed any clear pathology.  She does follow-up with orthopedics for the same.  5.  B12 deficiency: We will restart her monthly B12 shots at this time  Follow-up instructions: I will see her back in 3 months time no labs.  She will get CT chest abdomen pelvis with contrast prior  I discussed the assessment and treatment plan with the patient. The patient was provided an opportunity to ask questions and all were answered. The patient agreed with the plan and demonstrated an understanding of the instructions.   The patient was advised to call back or seek an in-person evaluation if the symptoms worsen or if the condition fails to improve as anticipated.   Visit Diagnosis: 1. Malignant neoplasm of lower-outer quadrant of right breast of female, estrogen receptor negative ([place])   2. 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[place])   3. B12 deficiency   4. Adrenal cancer, right [facility])     Dr. Randa Evens, MD, MPH [facility] Tel- [phone] 05/20/2019 8:20 AM

## 2019-05-22 ENCOUNTER — Other Ambulatory Visit
Admission: RE | Admit: 2019-05-22 | Discharge: 2019-05-22 | Disposition: A | Discharge status: Home / Self Care | Payer: Medicare HMO | Source: Ambulatory Visit | Attending: Gastroenterology | Admitting: Gastroenterology

## 2019-05-22 ENCOUNTER — Other Ambulatory Visit: Payer: Self-pay

## 2019-05-22 DIAGNOSIS — Z01812 Encounter for preprocedural laboratory examination: Secondary | ICD-10-CM | POA: Diagnosis not present

## 2019-05-22 DIAGNOSIS — Z20822 Contact with and (suspected) exposure to covid-19: Secondary | ICD-10-CM | POA: Diagnosis not present

## 2019-05-22 LAB — SARS CORONAVIRUS 2 (TAT 6-24 HRS): SARS Coronavirus 2: NEGATIVE

## 2019-05-24 ENCOUNTER — Encounter: Payer: Self-pay | Admitting: Gastroenterology

## 2019-05-24 ENCOUNTER — Encounter
Admission: RE | Disposition: A | Discharge status: Home / Self Care | Payer: Self-pay | Source: Home / Self Care | Attending: Gastroenterology

## 2019-05-24 ENCOUNTER — Ambulatory Visit: Payer: Medicare HMO | Admitting: Anesthesiology

## 2019-05-24 ENCOUNTER — Other Ambulatory Visit: Payer: Self-pay

## 2019-05-24 ENCOUNTER — Ambulatory Visit
Admission: RE | Admit: 2019-05-24 | Discharge: 2019-05-24 | Disposition: A | Discharge status: Home / Self Care | Payer: Medicare HMO | Attending: Gastroenterology | Admitting: Gastroenterology

## 2019-05-24 DIAGNOSIS — K449 Diaphragmatic hernia without obstruction or gangrene: Secondary | ICD-10-CM | POA: Insufficient documentation

## 2019-05-24 DIAGNOSIS — Z8521 Personal history of malignant neoplasm of larynx: Secondary | ICD-10-CM | POA: Insufficient documentation

## 2019-05-24 DIAGNOSIS — Z9221 Personal history of antineoplastic chemotherapy: Secondary | ICD-10-CM | POA: Insufficient documentation

## 2019-05-24 DIAGNOSIS — Z79899 Other long term (current) drug therapy: Secondary | ICD-10-CM | POA: Insufficient documentation

## 2019-05-24 DIAGNOSIS — Z853 Personal history of malignant neoplasm of breast: Secondary | ICD-10-CM | POA: Insufficient documentation

## 2019-05-24 DIAGNOSIS — Z809 Family history of malignant neoplasm, unspecified: Secondary | ICD-10-CM | POA: Diagnosis not present

## 2019-05-24 DIAGNOSIS — Z801 Family history of malignant neoplasm of trachea, bronchus and lung: Secondary | ICD-10-CM | POA: Insufficient documentation

## 2019-05-24 DIAGNOSIS — R1310 Dysphagia, unspecified: Secondary | ICD-10-CM | POA: Diagnosis not present

## 2019-05-24 DIAGNOSIS — E896 Postprocedural adrenocortical (-medullary) hypofunction: Secondary | ICD-10-CM | POA: Insufficient documentation

## 2019-05-24 DIAGNOSIS — R05 Cough: Secondary | ICD-10-CM | POA: Diagnosis not present

## 2019-05-24 DIAGNOSIS — Z923 Personal history of irradiation: Secondary | ICD-10-CM | POA: Insufficient documentation

## 2019-05-24 DIAGNOSIS — Z8249 Family history of ischemic heart disease and other diseases of the circulatory system: Secondary | ICD-10-CM | POA: Diagnosis not present

## 2019-05-24 DIAGNOSIS — Z85828 Personal history of other malignant neoplasm of skin: Secondary | ICD-10-CM | POA: Insufficient documentation

## 2019-05-24 DIAGNOSIS — Z833 Family history of diabetes mellitus: Secondary | ICD-10-CM | POA: Diagnosis not present

## 2019-05-24 DIAGNOSIS — K222 Esophageal obstruction: Secondary | ICD-10-CM | POA: Diagnosis not present

## 2019-05-24 DIAGNOSIS — K219 Gastro-esophageal reflux disease without esophagitis: Secondary | ICD-10-CM | POA: Insufficient documentation

## 2019-05-24 DIAGNOSIS — Z808 Family history of malignant neoplasm of other organs or systems: Secondary | ICD-10-CM | POA: Diagnosis not present

## 2019-05-24 HISTORY — PX: ESOPHAGOGASTRODUODENOSCOPY (EGD) WITH PROPOFOL: SHX5813

## 2019-05-24 SURGERY — ESOPHAGOGASTRODUODENOSCOPY (EGD) WITH PROPOFOL
Anesthesia: General

## 2019-05-24 MED ORDER — SODIUM CHLORIDE 0.9 % IV SOLN: INTRAVENOUS | Status: DC

## 2019-05-24 MED ORDER — PROPOFOL 10 MG/ML IV BOLUS
INTRAVENOUS | Status: DC | PRN
  Administered 2019-05-24: 70 mg via INTRAVENOUS

## 2019-05-24 MED ORDER — PROPOFOL 500 MG/50ML IV EMUL
INTRAVENOUS | Status: DC | PRN
  Administered 2019-05-24: 140 ug/kg/min via INTRAVENOUS

## 2019-05-24 NOTE — Anesthesia Preprocedure Evaluation (Signed)
Anesthesia Evaluation  Patient identified by MRN, date of birth, ID band Patient awake    Reviewed: Allergy & Precautions, H&P , NPO status , Patient's Chart, lab work & pertinent test results, reviewed documented beta blocker date and time   Airway Mallampati: II   Neck ROM: full    Dental  (+) Poor Dentition   Pulmonary neg pulmonary ROS, former smoker,    Pulmonary exam normal        Cardiovascular Exercise Tolerance: Good negative cardio ROS Normal cardiovascular exam Rhythm:regular Rate:Normal     Neuro/Psych  Neuromuscular disease negative psych ROS   GI/Hepatic Neg liver ROS, hiatal hernia, GERD  Medicated,  Endo/Other  negative endocrine ROS  Renal/GU negative Renal ROS  negative genitourinary   Musculoskeletal   Abdominal   Peds  Hematology negative hematology ROS (+)   Anesthesia Other Findings Past Medical History: No date: Arthritis     Comment:  KNEE RIGHT 1997: Breast cancer ([place])     Comment:  right breast lumpectomy with radiation 11/15/2017: Breast cancer ([place])     Comment:  INVASIVE MAMMARY CARCINOMA/ triple negative 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[place]) 03/02/2017: Cough     Comment:  INTERMTTENT DRY COUGH No date: GERD (gastroesophageal reflux disease) No date: History of hiatal hernia No date: Personal history of chemotherapy No date: Personal history of radiation therapy No date: Skin cancer 1998: Throat cancer [facility])     Comment:  radiation Past Surgical History: 1997: BREAST BIOPSY; Right     Comment:  positive 11/15/2017: BREAST BIOPSY; Right     Comment:  INVASIVE MAMMARY CARCINOMA triple negative 1997: BREAST LUMPECTOMY; Right     Comment:  2019 also 12/08/2017: BREAST LUMPECTOMY WITH SENTINEL LYMPH NODE BIOPSY; Right     Comment:  Procedure: BREAST LUMPECTOMY WITH SENTINEL LYMPH NODE               BX;  Surgeon: Robert Bellow, MD;  Location: [facility]               ORS;   Service: General;  Laterality: Right; 2015: COLONOSCOPY No date: ESOPHAGUS SURGERY No date: KNEE SURGERY; Right No date: PORT-A-CATH REMOVAL 01/17/2018: PORTACATH PLACEMENT; Right     Comment:  Procedure: INSERTION PORT-A-CATH- RIGHT;  Surgeon:               Robert Bellow, MD;  Location: [facility];  Service:               General;  Laterality: Right; 12/28/2016: ROBOTIC ADRENALECTOMY; Right     Comment:  Procedure: ROBOTIC ADRENALECTOMY;  Surgeon: Hollice Espy, MD;  Location: [facility] ORS;  Service: Urology;                Laterality: Right; No date: SKIN CANCER EXCISION 1998: THROAT SURGERY     Comment:  throat cancer  No date: TUBAL LIGATION 03/03/2017: VENTRAL HERNIA REPAIR; N/A     Comment:  Procedure: HERNIA REPAIR VENTRAL ADULT;  Surgeon:               Clayburn Pert, MD;  Location: [facility] ORS;  Service:               General;  Laterality: N/A; BMI    Body Mass Index: 23.43 kg/m     Reproductive/Obstetrics negative OB ROS  Anesthesia Physical Anesthesia Plan  ASA: II  Anesthesia Plan: General   Post-op Pain Management:    Induction:   PONV Risk Score and Plan:   Airway Management Planned:   Additional Equipment:   Intra-op Plan:   Post-operative Plan:   Informed Consent: I have reviewed the patients History and Physical, chart, labs and discussed the procedure including the risks, benefits and alternatives for the proposed anesthesia with the patient or authorized representative who has indicated his/her understanding and acceptance.     Dental Advisory Given  Plan Discussed with: CRNA  Anesthesia Plan Comments:         Anesthesia Quick Evaluation

## 2019-05-24 NOTE — Interval H&P Note (Signed)
History and Physical Interval Note:  05/24/2019 8:25 AM  Denise Watts  has presented today for surgery, with the diagnosis of Dysphagia R13.10.  The various methods of treatment have been discussed with the patient and family. After consideration of risks, benefits and other options for treatment, the patient has consented to  Procedure(s): ESOPHAGOGASTRODUODENOSCOPY (EGD) WITH PROPOFOL (N/A) as a surgical intervention.  The patient's history has been reviewed, patient examined, no change in status, stable for surgery.  I have reviewed the patient's chart and labs.  Questions were answered to the patient's satisfaction.     Lisandro Meggett [employer]

## 2019-05-24 NOTE — Anesthesia Postprocedure Evaluation (Signed)
Anesthesia Post Note  Patient: Denise Watts  Procedure(s) Performed: ESOPHAGOGASTRODUODENOSCOPY (EGD) WITH PROPOFOL (N/A )  Patient location during evaluation: PACU Anesthesia Type: General Level of consciousness: awake and alert Pain management: pain level controlled Vital Signs Assessment: post-procedure vital signs reviewed and stable Respiratory status: spontaneous breathing, nonlabored ventilation, respiratory function stable and patient connected to nasal cannula oxygen Cardiovascular status: blood pressure returned to baseline and stable Postop Assessment: no apparent nausea or vomiting Anesthetic complications: no     Last Vitals:  Vitals:   05/24/19 0852 05/24/19 0853  BP: 128/74 128/74  Pulse: (!) 110   Resp: 17 20  Temp: 37.1 C   SpO2: 97% 94%    Last Pain:  Vitals:   05/24/19 0852  TempSrc: Temporal  PainSc:                  Molli Barrows

## 2019-05-24 NOTE — Transfer of Care (Signed)
Immediate Anesthesia Transfer of Care Note  Patient: Denise Watts  Procedure(s) Performed: ESOPHAGOGASTRODUODENOSCOPY (EGD) WITH PROPOFOL (N/A )  Patient Location: PACU  Anesthesia Type:General  Level of Consciousness: sedated  Airway & Oxygen Therapy: Patient Spontanous Breathing  Post-op Assessment: Report given to RN and Post -op Vital signs reviewed and stable  Post vital signs: Reviewed and stable  Last Vitals:  Vitals Value Taken Time  BP 128/74 05/24/19 0853  Temp    Pulse 112 05/24/19 0852  Resp 20 05/24/19 0853  SpO2 94 % 05/24/19 0853  Vitals shown include unvalidated device data.  Last Pain:  Vitals:   05/24/19 0740  TempSrc: Temporal  PainSc: 0-No pain         Complications: No apparent anesthesia complications

## 2019-05-24 NOTE — Op Note (Signed)
[facility] Gastroenterology Patient Name: Denise Watts Procedure Date: 05/24/2019 8:32 AM MRN: [id number] Account #: [id number] Date of Birth: 03-Oct-1955 Admit Type: Outpatient Age: 64 Room: [facility] ENDO ROOM 4 Gender: Female Note Status: Finalized Procedure:             Upper GI endoscopy Indications:           Dysphagia Providers:             Lucilla Lame MD, MD Referring MD:          Vickki Muff. Chrismon, MD (Referring MD) Medicines:             Propofol per Anesthesia Complications:         No immediate complications. Procedure:             Pre-Anesthesia Assessment:                        - Prior to the procedure, a History and Physical was                         performed, and patient medications and allergies were                         reviewed. The patient's tolerance of previous                         anesthesia was also reviewed. The risks and benefits                         of the procedure and the sedation options and risks                         were discussed with the patient. All questions were                         answered, and informed consent was obtained. Prior                         Anticoagulants: The patient has taken no previous                         anticoagulant or antiplatelet agents. ASA Grade                         Assessment: II - A patient with mild systemic disease.                         After reviewing the risks and benefits, the patient                         was deemed in satisfactory condition to undergo the                         procedure.                        After obtaining informed consent, the endoscope was  passed under direct vision. Throughout the procedure,                         the patient's blood pressure, pulse, and oxygen                         saturations were monitored continuously. The Endoscope                         was introduced through the mouth, and advanced to the                        second part of duodenum. The upper GI endoscopy was                         accomplished without difficulty. The patient tolerated                         the procedure well. Findings:      One benign-appearing, intrinsic moderate stenosis was found in the       proximal esophagus. The stenosis was traversed. A TTS dilator was passed       through the scope. Dilation with a 12-13.5-15 mm balloon dilator was       performed to 13.5 mm. The dilation site was examined following endoscope       reinsertion and showed moderate mucosal disruption.      The stomach was normal.      The examined duodenum was normal. Impression:            - Benign-appearing esophageal stenosis. Dilated.                        - Normal stomach.                        - Normal examined duodenum.                        - No specimens collected. Recommendation:        - Discharge patient to home.                        - Resume previous diet.                        - Continue present medications.                        - Repeat upper endoscopy PRN for retreatment. Procedure Code(s):     --- Professional ---                        [phone], Esophagogastroduodenoscopy, flexible,                         transoral; with transendoscopic balloon dilation of                         esophagus (less than 30 mm diameter) Diagnosis Code(s):     --- Professional ---  R13.10, Dysphagia, unspecified                        K22.2, Esophageal obstruction CPT copyright 2019 [employer]. All rights reserved. The codes documented in this report are preliminary and upon coder review may  be revised to meet current compliance requirements. Lucilla Lame MD, MD 05/24/2019 8:48:36 AM This report has been signed electronically. Number of Addenda: 0 Note Initiated On: 05/24/2019 8:32 AM Estimated Blood Loss:  Estimated blood loss: none.      [facility]

## 2019-05-27 ENCOUNTER — Encounter: Payer: Self-pay | Admitting: *Deleted

## 2019-06-03 ENCOUNTER — Encounter (HOSPITAL_BASED_OUTPATIENT_CLINIC_OR_DEPARTMENT_OTHER): Payer: Self-pay | Admitting: Plastic Surgery

## 2019-06-03 ENCOUNTER — Other Ambulatory Visit: Payer: Self-pay

## 2019-06-06 ENCOUNTER — Other Ambulatory Visit: Payer: Self-pay

## 2019-06-06 ENCOUNTER — Encounter: Payer: Self-pay | Admitting: Surgical

## 2019-06-06 ENCOUNTER — Ambulatory Visit (INDEPENDENT_AMBULATORY_CARE_PROVIDER_SITE_OTHER): Payer: Medicare HMO | Admitting: Surgical

## 2019-06-06 ENCOUNTER — Other Ambulatory Visit (HOSPITAL_COMMUNITY): Payer: Medicare HMO

## 2019-06-06 ENCOUNTER — Other Ambulatory Visit
Admission: RE | Admit: 2019-06-06 | Discharge: 2019-06-06 | Disposition: A | Discharge status: Home / Self Care | Payer: Medicare HMO | Source: Ambulatory Visit | Attending: Plastic Surgery | Admitting: Plastic Surgery

## 2019-06-06 VITALS — BP 137/83 | HR 78 | Temp 98.4°F | Ht 61.0 in | Wt 126.2 lb

## 2019-06-06 DIAGNOSIS — Z923 Personal history of irradiation: Secondary | ICD-10-CM

## 2019-06-06 DIAGNOSIS — Z20822 Contact with and (suspected) exposure to covid-19: Secondary | ICD-10-CM | POA: Diagnosis not present

## 2019-06-06 DIAGNOSIS — Z171 Estrogen receptor negative status [ER-]: Secondary | ICD-10-CM

## 2019-06-06 DIAGNOSIS — Z9011 Acquired absence of right breast and nipple: Secondary | ICD-10-CM

## 2019-06-06 DIAGNOSIS — C50511 Malignant neoplasm of lower-outer quadrant of right female breast: Secondary | ICD-10-CM

## 2019-06-06 DIAGNOSIS — Z01812 Encounter for preprocedural laboratory examination: Secondary | ICD-10-CM | POA: Diagnosis not present

## 2019-06-06 DIAGNOSIS — N6489 Other specified disorders of breast: Secondary | ICD-10-CM

## 2019-06-06 LAB — SARS CORONAVIRUS 2 (TAT 6-24 HRS): SARS Coronavirus 2: NEGATIVE

## 2019-06-06 MED ORDER — HYDROCODONE-ACETAMINOPHEN 5-325 MG PO TABS: 1.0000 | ORAL_TABLET | Freq: Four times a day (QID) | ORAL | 0 refills | Status: AC | PRN

## 2019-06-06 MED ORDER — ONDANSETRON HCL 4 MG PO TABS: 4.0000 mg | ORAL_TABLET | Freq: Three times a day (TID) | ORAL | 0 refills | Status: DC | PRN

## 2019-06-06 MED ORDER — CEPHALEXIN 500 MG PO CAPS: 500.0000 mg | ORAL_CAPSULE | Freq: Four times a day (QID) | ORAL | 0 refills | Status: AC

## 2019-06-06 NOTE — Progress Notes (Signed)
Patient ID: Denise Watts, female    DOB: October 29, 1955, 64 y.o.   MRN: [id number]  Chief Complaint  Patient presents with  . Pre-op Exam    H&P for left breast mastopexy reduction for symmetry right breast scar and capsule release from radiation with fat graft SX: 06/10/19      ICD-10-CM   1. Status post partial mastectomy of right breast  Z90.11   2. Postoperative breast asymmetry  N64.89   3. Malignant neoplasm of lower-outer quadrant of right breast of female, estrogen receptor negative ([place])  C50.511    Z17.1   4. History of radiation therapy  Z92.3    History of Present Illness: Denise Watts is a 64 y.o.  female  with a history of partial mastectomy in 1997 followed by radiation, she then had a recurrence and underwent another partial mastectomy with a repeat of radiation in the last 2 years (approximately September of last year).  She presents for preoperative evaluation for upcoming procedure, left breast mastopexy reduction for symmetry, right breast scar and capsule release with fat grafting/liposuction scheduled for 06/10/19 with Dr. Marla Roe.  She has a hx of breast radiation and radiation for throat cancer - today it appears she has some calcium deposits over the anterior aspect of her throat, they are tender. Small hard lesions ~ 1 x 1 mm. She has previously been evaluated for these, but report they are recently worsening and more painful.  No recent illnesses or colds. Generally feeling well.  The patient has not had problems with anesthesia. No hx of dvt/pe, no fmhx of dvt/pe. She is not taking any blood thinners. Tolerated previous surgeries well.  Past Medical History: Allergies: No Known Allergies  Current Medications:  Current Outpatient Medications:  .  Cyanocobalamin (B-12 COMPLIANCE INJECTION IJ), Inject 1 Dose as directed every 30 (thirty) days., Disp: , Rfl:  .  DULoxetine (CYMBALTA) 30 MG capsule, Take 30 mg by mouth daily., Disp: , Rfl:  .  naproxen sodium  (ALEVE) 220 MG tablet, Take 220 mg by mouth daily as needed (pain). , Disp: , Rfl:  .  omeprazole (PRILOSEC) 40 MG capsule, TAKE 1 CAPSULE BY MOUTH EVERY DAY (MORNING), Disp: 30 capsule, Rfl: 11 .  cephALEXin (KEFLEX) 500 MG capsule, Take 1 capsule (500 mg total) by mouth 4 (four) times daily for 5 days., Disp: 20 capsule, Rfl: 0 .  HYDROcodone-acetaminophen (NORCO) 5-325 MG tablet, Take 1 tablet by mouth every 6 (six) hours as needed for up to 5 days for severe pain., Disp: 20 tablet, Rfl: 0 .  ondansetron (ZOFRAN) 4 MG tablet, Take 1 tablet (4 mg total) by mouth every 8 (eight) hours as needed for nausea or vomiting., Disp: 20 tablet, Rfl: 0 No current facility-administered medications for this visit.  Facility-Administered Medications Ordered in Other Visits:  .  cyanocobalamin ((VITAMIN B-12)) injection 1,000 mcg, 1,000 mcg, Intramuscular, Q30 days, Sindy Guadeloupe, MD, 1,000 mcg at 08/03/18 0936 .  cyanocobalamin ((VITAMIN B-12)) injection 1,000 mcg, 1,000 mcg, Intramuscular, Q30 days, Sindy Guadeloupe, MD, 1,000 mcg at 09/28/18 1013 .  cyanocobalamin ((VITAMIN B-12)) injection 1,000 mcg, 1,000 mcg, Intramuscular, Q30 days, Sindy Guadeloupe, MD, 1,000 mcg at 11/30/18 1355  Past Medical Problems: Past Medical History:  Diagnosis Date  . Anxiety   . Arthritis    KNEE RIGHT  . Breast cancer [facility]) 1997   right breast lumpectomy with radiation  . Breast cancer ([place]) 11/15/2017   INVASIVE MAMMARY CARCINOMA/  triple negative  . 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 Cough 03/02/2017   INTERMTTENT DRY COUGH  . Depression   . GERD (gastroesophageal reflux disease)   . History of hiatal hernia   . Personal history of chemotherapy   . Personal history of radiation therapy   . Skin cancer   . Throat cancer ([place]) 1998   radiation    Past Surgical History: Past Surgical History:  Procedure Laterality Date  . BREAST BIOPSY Right 1997   positive  . BREAST BIOPSY Right 11/15/2017   INVASIVE  MAMMARY CARCINOMA triple negative  . BREAST LUMPECTOMY Right 1997   2019 also  . BREAST LUMPECTOMY WITH SENTINEL LYMPH NODE BIOPSY Right 12/08/2017   Procedure: BREAST LUMPECTOMY WITH SENTINEL LYMPH NODE BX;  Surgeon: Robert Bellow, MD;  Location: [facility] ORS;  Service: General;  Laterality: Right;  . COLONOSCOPY  2015  . ESOPHAGOGASTRODUODENOSCOPY (EGD) WITH PROPOFOL N/A 05/24/2019   Procedure: ESOPHAGOGASTRODUODENOSCOPY (EGD) WITH PROPOFOL;  Surgeon: Lucilla Lame, MD;  Location: [facility] ENDOSCOPY;  Service: Endoscopy;  Laterality: N/A;  . ESOPHAGUS SURGERY    . KNEE SURGERY Right   . PORT-A-CATH REMOVAL    . PORTACATH PLACEMENT Right 01/17/2018   Procedure: INSERTION PORT-A-CATH- RIGHT;  Surgeon: Robert Bellow, MD;  Location: [facility] ORS;  Service: General;  Laterality: Right;  . ROBOTIC ADRENALECTOMY Right 12/28/2016   Procedure: ROBOTIC ADRENALECTOMY;  Surgeon: Hollice Espy, MD;  Location: [facility] ORS;  Service: Urology;  Laterality: Right;  . SKIN CANCER EXCISION    . THROAT SURGERY  1998   throat cancer   . TUBAL LIGATION    . VENTRAL HERNIA REPAIR N/A 03/03/2017   Procedure: HERNIA REPAIR VENTRAL ADULT;  Surgeon: Clayburn Pert, MD;  Location: [facility] ORS;  Service: General;  Laterality: N/A;    Social History: Social History   Socioeconomic History  . Marital status: Married    Spouse name: Not on file  . Number of children: Not on file  . Years of education: Not on file  . Highest education level: Not on file  Occupational History  . Not on file  Tobacco Use  . Smoking status: Former Smoker    Packs/day: 1.50    Years: 30.00    Pack years: 45.00    Types: Cigarettes    Quit date: 05/02/1996    Years since quitting: 23.1  . Smokeless tobacco: Never Used  Substance and Sexual Activity  . Alcohol use: Yes    Alcohol/week: 0.0 standard drinks    Comment: occ.  . Drug use: No  . Sexual activity: Not on file  Other Topics Concern  . Not on file  Social History Narrative    . Not on file   Social Determinants of Health   Financial Resource Strain:   . Difficulty of Paying Living Expenses: Not on file  Food Insecurity:   . Worried About Charity fundraiser in the Last Year: Not on file  . Ran Out of Food in the Last Year: Not on file  Transportation Needs:   . Lack of Transportation (Medical): Not on file  . Lack of Transportation (Non-Medical): Not on file  Physical Activity:   . Days of Exercise per Week: Not on file  . Minutes of Exercise per Session: Not on file  Stress:   . Feeling of Stress : Not on file  Social Connections:   . Frequency of Communication with Friends and Family: Not on file  . Frequency of  Social Gatherings with Friends and Family: Not on file  . Attends Religious Services: Not on file  . Active Member of Clubs or Organizations: Not on file  . Attends Archivist Meetings: Not on file  . Marital Status: Not on file  Intimate Partner Violence:   . Fear of Current or Ex-Partner: Not on file  . Emotionally Abused: Not on file  . Physically Abused: Not on file  . Sexually Abused: Not on file    Family History: Family History  Problem Relation Age of Onset  . Diabetes Mother   . Heart attack Mother   . Cancer Sister        cancer of eyes, spread to lung, other places. died at 40  . Cancer Sister 38       unknown primary, spread to bone, brain died in 35's  . Cancer Paternal Aunt        unk type  . Cancer Paternal Grandmother        type unk  . Prostate cancer Neg Hx   . Kidney cancer Neg Hx   . Bladder Cancer Neg Hx     Review of Systems: Review of Systems  Constitutional: Negative.   Respiratory: Negative.   Cardiovascular: Negative.   Gastrointestinal: Negative.   Neurological: Negative.     Physical Exam: Vital Signs BP 137/83 (BP Location: Left Arm, Patient Position: Sitting, Cuff Size: Normal)   Pulse 78   Temp 98.4 F (36.9 C) (Temporal)   Ht 5\' 1"  (1.549 m)   Wt 126 lb 3.2 oz (57.2 kg)    SpO2 98%   BMI 23.85 kg/m  Physical Exam Exam conducted with a chaperone present.  Constitutional:      General: She is not in acute distress.    Appearance: Normal appearance. She is not ill-appearing.  HENT:     Head: Normocephalic and atraumatic.  Eyes:     Pupils: Pupils are equal, round Neck:     Musculoskeletal: Normal range of motion.  Cardiovascular:     Rate and Rhythm: Normal rate and regular rhythm.     Pulses: Normal pulses.     Heart sounds: Normal heart sounds. No murmur.  Pulmonary:     Effort: Pulmonary effort is normal. No respiratory distress.     Breath sounds: Normal breath sounds. No wheezing.  Abdominal:     General: Abdomen is flat. There is no distension.     Palpations: Abdomen is soft.     Tenderness: There is no abdominal tenderness.  Musculoskeletal: Normal range of motion.  Skin:    General: Skin is warm and dry.     Findings: No erythema or rash.  Neurological:     General: No focal deficit present.     Mental Status: She is alert and oriented to person, place, and time. Mental status is at baseline.     Motor: No weakness.  Psychiatric:        Mood and Affect: Mood normal.        Behavior: Behavior normal.    Assessment/Plan: The patient is scheduled forleft breast mastopexy reduction for symmetry, right breast scar and capsule release with fat grafting scheduled for 06/10/19 with Dr. Marla Roe. Risks, benefits, and alternatives of procedure discussed, questions answered.  Mammogram: 12/11/2018 - no mammographic evidence of malignancy in either breast. Smoking status: Quit in 1997 Caprini score: 6, high, recommend mechanical ppx, early ambulation, chemoppx postoperatively.  Rx sent to pharmacy COVID tested this AM  The risks that can be encountered with and after surgery were discussed and include the following but not limited to these: bleeding, infection, delayed healing, anesthesia risks, skin sensation changes, injury to structures  including nerves, blood vessels, and muscles which may be temporary or permanent, allergies to tape, suture materials and glues, blood products, topical preparations or injected agents, skin contour irregularities, skin discoloration and swelling, deep vein thrombosis, cardiac and pulmonary complications, pain, which may persist, persistent pain, recurrence of the lesion, poor healing of the incision, possible need for revisional surgery or staged procedures.  The risk that can be encountered with mastopexy were discussed and include the following but not limited to these: asymmetry, fluid accumulation, firmness of the breast, inability to breast feed, loss of nipple or areola, skin loss, decrease or no nipple sensation, fat necrosis of the breast tissue, bleeding, infection, healing delay.  There are risks of anesthesia, changes to skin sensation and injury to nerves or blood vessels.  Any of these can lead to the need for revisonal surgery or stage procedures.  A reduction has potential to interfere with diagnostic procedures.    Electronically signed by: Carola Rhine Atsushi Yom, PA-C 06/06/2019 2:32 PM

## 2019-06-06 NOTE — H&P (View-Only) (Signed)
Patient ID: Denise Watts, female    DOB: June 16, 1955, 64 y.o.   MRN: [id number]  Chief Complaint  Patient presents with  . Pre-op Exam    H&P for left breast mastopexy reduction for symmetry right breast scar and capsule release from radiation with fat graft SX: 06/10/19      ICD-10-CM   1. Status post partial mastectomy of right breast  Z90.11   2. Postoperative breast asymmetry  N64.89   3. Malignant neoplasm of lower-outer quadrant of right breast of female, estrogen receptor negative ([place])  C50.511    Z17.1   4. History of radiation therapy  Z92.3    History of Present Illness: Denise Watts is a 64 y.o.  female  with a history of partial mastectomy in 1997 followed by radiation, she then had a recurrence and underwent another partial mastectomy with a repeat of radiation in the last 2 years (approximately September of last year).  She presents for preoperative evaluation for upcoming procedure, left breast mastopexy reduction for symmetry, right breast scar and capsule release with fat grafting/liposuction scheduled for 06/10/19 with Dr. Marla Roe.  She has a hx of breast radiation and radiation for throat cancer - today it appears she has some calcium deposits over the anterior aspect of her throat, they are tender. Small hard lesions ~ 1 x 1 mm. She has previously been evaluated for these, but report they are recently worsening and more painful.  No recent illnesses or colds. Generally feeling well.  The patient has not had problems with anesthesia. No hx of dvt/pe, no fmhx of dvt/pe. She is not taking any blood thinners. Tolerated previous surgeries well.  Past Medical History: Allergies: No Known Allergies  Current Medications:  Current Outpatient Medications:  .  Cyanocobalamin (B-12 COMPLIANCE INJECTION IJ), Inject 1 Dose as directed every 30 (thirty) days., Disp: , Rfl:  .  DULoxetine (CYMBALTA) 30 MG capsule, Take 30 mg by mouth daily., Disp: , Rfl:  .  naproxen sodium  (ALEVE) 220 MG tablet, Take 220 mg by mouth daily as needed (pain). , Disp: , Rfl:  .  omeprazole (PRILOSEC) 40 MG capsule, TAKE 1 CAPSULE BY MOUTH EVERY DAY (MORNING), Disp: 30 capsule, Rfl: 11 .  cephALEXin (KEFLEX) 500 MG capsule, Take 1 capsule (500 mg total) by mouth 4 (four) times daily for 5 days., Disp: 20 capsule, Rfl: 0 .  HYDROcodone-acetaminophen (NORCO) 5-325 MG tablet, Take 1 tablet by mouth every 6 (six) hours as needed for up to 5 days for severe pain., Disp: 20 tablet, Rfl: 0 .  ondansetron (ZOFRAN) 4 MG tablet, Take 1 tablet (4 mg total) by mouth every 8 (eight) hours as needed for nausea or vomiting., Disp: 20 tablet, Rfl: 0 No current facility-administered medications for this visit.  Facility-Administered Medications Ordered in Other Visits:  .  cyanocobalamin ((VITAMIN B-12)) injection 1,000 mcg, 1,000 mcg, Intramuscular, Q30 days, Sindy Guadeloupe, MD, 1,000 mcg at 08/03/18 0936 .  cyanocobalamin ((VITAMIN B-12)) injection 1,000 mcg, 1,000 mcg, Intramuscular, Q30 days, Sindy Guadeloupe, MD, 1,000 mcg at 09/28/18 1013 .  cyanocobalamin ((VITAMIN B-12)) injection 1,000 mcg, 1,000 mcg, Intramuscular, Q30 days, Sindy Guadeloupe, MD, 1,000 mcg at 11/30/18 1355  Past Medical Problems: Past Medical History:  Diagnosis Date  . Anxiety   . Arthritis    KNEE RIGHT  . Breast cancer [facility]) 1997   right breast lumpectomy with radiation  . Breast cancer ([place]) 11/15/2017   INVASIVE MAMMARY CARCINOMA/  triple negative  . 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 Cough 03/02/2017   INTERMTTENT DRY COUGH  . Depression   . GERD (gastroesophageal reflux disease)   . History of hiatal hernia   . Personal history of chemotherapy   . Personal history of radiation therapy   . Skin cancer   . Throat cancer ([place]) 1998   radiation    Past Surgical History: Past Surgical History:  Procedure Laterality Date  . BREAST BIOPSY Right 1997   positive  . BREAST BIOPSY Right 11/15/2017   INVASIVE  MAMMARY CARCINOMA triple negative  . BREAST LUMPECTOMY Right 1997   2019 also  . BREAST LUMPECTOMY WITH SENTINEL LYMPH NODE BIOPSY Right 12/08/2017   Procedure: BREAST LUMPECTOMY WITH SENTINEL LYMPH NODE BX;  Surgeon: Robert Bellow, MD;  Location: [facility] ORS;  Service: General;  Laterality: Right;  . COLONOSCOPY  2015  . ESOPHAGOGASTRODUODENOSCOPY (EGD) WITH PROPOFOL N/A 05/24/2019   Procedure: ESOPHAGOGASTRODUODENOSCOPY (EGD) WITH PROPOFOL;  Surgeon: Lucilla Lame, MD;  Location: [facility] ENDOSCOPY;  Service: Endoscopy;  Laterality: N/A;  . ESOPHAGUS SURGERY    . KNEE SURGERY Right   . PORT-A-CATH REMOVAL    . PORTACATH PLACEMENT Right 01/17/2018   Procedure: INSERTION PORT-A-CATH- RIGHT;  Surgeon: Robert Bellow, MD;  Location: [facility] ORS;  Service: General;  Laterality: Right;  . ROBOTIC ADRENALECTOMY Right 12/28/2016   Procedure: ROBOTIC ADRENALECTOMY;  Surgeon: Hollice Espy, MD;  Location: [facility] ORS;  Service: Urology;  Laterality: Right;  . SKIN CANCER EXCISION    . THROAT SURGERY  1998   throat cancer   . TUBAL LIGATION    . VENTRAL HERNIA REPAIR N/A 03/03/2017   Procedure: HERNIA REPAIR VENTRAL ADULT;  Surgeon: Clayburn Pert, MD;  Location: [facility] ORS;  Service: General;  Laterality: N/A;    Social History: Social History   Socioeconomic History  . Marital status: Married    Spouse name: Not on file  . Number of children: Not on file  . Years of education: Not on file  . Highest education level: Not on file  Occupational History  . Not on file  Tobacco Use  . Smoking status: Former Smoker    Packs/day: 1.50    Years: 30.00    Pack years: 45.00    Types: Cigarettes    Quit date: 05/02/1996    Years since quitting: 23.1  . Smokeless tobacco: Never Used  Substance and Sexual Activity  . Alcohol use: Yes    Alcohol/week: 0.0 standard drinks    Comment: occ.  . Drug use: No  . Sexual activity: Not on file  Other Topics Concern  . Not on file  Social History Narrative   . Not on file   Social Determinants of Health   Financial Resource Strain:   . Difficulty of Paying Living Expenses: Not on file  Food Insecurity:   . Worried About Charity fundraiser in the Last Year: Not on file  . Ran Out of Food in the Last Year: Not on file  Transportation Needs:   . Lack of Transportation (Medical): Not on file  . Lack of Transportation (Non-Medical): Not on file  Physical Activity:   . Days of Exercise per Week: Not on file  . Minutes of Exercise per Session: Not on file  Stress:   . Feeling of Stress : Not on file  Social Connections:   . Frequency of Communication with Friends and Family: Not on file  . Frequency of Social  Gatherings with Friends and Family: Not on file  . Attends Religious Services: Not on file  . Active Member of Clubs or Organizations: Not on file  . Attends Archivist Meetings: Not on file  . Marital Status: Not on file  Intimate Partner Violence:   . Fear of Current or Ex-Partner: Not on file  . Emotionally Abused: Not on file  . Physically Abused: Not on file  . Sexually Abused: Not on file    Family History: Family History  Problem Relation Age of Onset  . Diabetes Mother   . Heart attack Mother   . Cancer Sister        cancer of eyes, spread to lung, other places. died at 81  . Cancer Sister 61       unknown primary, spread to bone, brain died in 47's  . Cancer Paternal Aunt        unk type  . Cancer Paternal Grandmother        type unk  . Prostate cancer Neg Hx   . Kidney cancer Neg Hx   . Bladder Cancer Neg Hx     Review of Systems: Review of Systems  Constitutional: Negative.   Respiratory: Negative.   Cardiovascular: Negative.   Gastrointestinal: Negative.   Neurological: Negative.     Physical Exam: Vital Signs BP 137/83 (BP Location: Left Arm, Patient Position: Sitting, Cuff Size: Normal)   Pulse 78   Temp 98.4 F (36.9 C) (Temporal)   Ht 5\' 1"  (1.549 m)   Wt 126 lb 3.2 oz (57.2 kg)    SpO2 98%   BMI 23.85 kg/m  Physical Exam Exam conducted with a chaperone present.  Constitutional:      General: She is not in acute distress.    Appearance: Normal appearance. She is not ill-appearing.  HENT:     Head: Normocephalic and atraumatic.  Eyes:     Pupils: Pupils are equal, round Neck:     Musculoskeletal: Normal range of motion.  Cardiovascular:     Rate and Rhythm: Normal rate and regular rhythm.     Pulses: Normal pulses.     Heart sounds: Normal heart sounds. No murmur.  Pulmonary:     Effort: Pulmonary effort is normal. No respiratory distress.     Breath sounds: Normal breath sounds. No wheezing.  Abdominal:     General: Abdomen is flat. There is no distension.     Palpations: Abdomen is soft.     Tenderness: There is no abdominal tenderness.  Musculoskeletal: Normal range of motion.  Skin:    General: Skin is warm and dry.     Findings: No erythema or rash.  Neurological:     General: No focal deficit present.     Mental Status: She is alert and oriented to person, place, and time. Mental status is at baseline.     Motor: No weakness.  Psychiatric:        Mood and Affect: Mood normal.        Behavior: Behavior normal.    Assessment/Plan: The patient is scheduled forleft breast mastopexy reduction for symmetry, right breast scar and capsule release with fat grafting scheduled for 06/10/19 with Dr. Marla Roe. Risks, benefits, and alternatives of procedure discussed, questions answered.  Mammogram: 12/11/2018 - no mammographic evidence of malignancy in either breast. Smoking status: Quit in 1997 Caprini score: 6, high, recommend mechanical ppx, early ambulation, chemoppx postoperatively.  Rx sent to pharmacy COVID tested this AM  The  risks that can be encountered with and after surgery were discussed and include the following but not limited to these: bleeding, infection, delayed healing, anesthesia risks, skin sensation changes, injury to structures  including nerves, blood vessels, and muscles which may be temporary or permanent, allergies to tape, suture materials and glues, blood products, topical preparations or injected agents, skin contour irregularities, skin discoloration and swelling, deep vein thrombosis, cardiac and pulmonary complications, pain, which may persist, persistent pain, recurrence of the lesion, poor healing of the incision, possible need for revisional surgery or staged procedures.  The risk that can be encountered with mastopexy were discussed and include the following but not limited to these: asymmetry, fluid accumulation, firmness of the breast, inability to breast feed, loss of nipple or areola, skin loss, decrease or no nipple sensation, fat necrosis of the breast tissue, bleeding, infection, healing delay.  There are risks of anesthesia, changes to skin sensation and injury to nerves or blood vessels.  Any of these can lead to the need for revisonal surgery or stage procedures.  A reduction has potential to interfere with diagnostic procedures.    Electronically signed by: Carola Rhine Garret Teale, PA-C 06/06/2019 2:32 PM

## 2019-06-10 ENCOUNTER — Ambulatory Visit (HOSPITAL_BASED_OUTPATIENT_CLINIC_OR_DEPARTMENT_OTHER)
Admission: RE | Admit: 2019-06-10 | Discharge: 2019-06-10 | Disposition: A | Discharge status: Home / Self Care | Payer: Medicare HMO | Attending: Plastic Surgery | Admitting: Plastic Surgery

## 2019-06-10 ENCOUNTER — Ambulatory Visit (HOSPITAL_BASED_OUTPATIENT_CLINIC_OR_DEPARTMENT_OTHER): Payer: Medicare HMO | Admitting: Anesthesiology

## 2019-06-10 ENCOUNTER — Encounter (HOSPITAL_BASED_OUTPATIENT_CLINIC_OR_DEPARTMENT_OTHER): Payer: Self-pay | Admitting: Plastic Surgery

## 2019-06-10 ENCOUNTER — Encounter (HOSPITAL_BASED_OUTPATIENT_CLINIC_OR_DEPARTMENT_OTHER)
Admission: RE | Disposition: A | Discharge status: Home / Self Care | Payer: Self-pay | Source: Home / Self Care | Attending: Plastic Surgery

## 2019-06-10 ENCOUNTER — Other Ambulatory Visit: Payer: Self-pay

## 2019-06-10 DIAGNOSIS — Z87891 Personal history of nicotine dependence: Secondary | ICD-10-CM | POA: Insufficient documentation

## 2019-06-10 DIAGNOSIS — M549 Dorsalgia, unspecified: Secondary | ICD-10-CM | POA: Diagnosis not present

## 2019-06-10 DIAGNOSIS — M542 Cervicalgia: Secondary | ICD-10-CM | POA: Diagnosis not present

## 2019-06-10 DIAGNOSIS — Z9011 Acquired absence of right breast and nipple: Secondary | ICD-10-CM

## 2019-06-10 DIAGNOSIS — Z79899 Other long term (current) drug therapy: Secondary | ICD-10-CM | POA: Diagnosis not present

## 2019-06-10 DIAGNOSIS — Z853 Personal history of malignant neoplasm of breast: Secondary | ICD-10-CM | POA: Insufficient documentation

## 2019-06-10 DIAGNOSIS — Z8521 Personal history of malignant neoplasm of larynx: Secondary | ICD-10-CM | POA: Diagnosis not present

## 2019-06-10 DIAGNOSIS — L905 Scar conditions and fibrosis of skin: Secondary | ICD-10-CM | POA: Diagnosis not present

## 2019-06-10 DIAGNOSIS — N6489 Other specified disorders of breast: Secondary | ICD-10-CM

## 2019-06-10 DIAGNOSIS — Z9221 Personal history of antineoplastic chemotherapy: Secondary | ICD-10-CM | POA: Insufficient documentation

## 2019-06-10 DIAGNOSIS — F329 Major depressive disorder, single episode, unspecified: Secondary | ICD-10-CM | POA: Insufficient documentation

## 2019-06-10 DIAGNOSIS — N6012 Diffuse cystic mastopathy of left breast: Secondary | ICD-10-CM | POA: Diagnosis not present

## 2019-06-10 DIAGNOSIS — F419 Anxiety disorder, unspecified: Secondary | ICD-10-CM | POA: Insufficient documentation

## 2019-06-10 DIAGNOSIS — N62 Hypertrophy of breast: Secondary | ICD-10-CM | POA: Diagnosis not present

## 2019-06-10 DIAGNOSIS — K219 Gastro-esophageal reflux disease without esophagitis: Secondary | ICD-10-CM | POA: Insufficient documentation

## 2019-06-10 DIAGNOSIS — Z923 Personal history of irradiation: Secondary | ICD-10-CM | POA: Diagnosis not present

## 2019-06-10 HISTORY — PX: BREAST REDUCTION WITH MASTOPEXY: SHX6465

## 2019-06-10 HISTORY — PX: LIPOSUCTION WITH LIPOFILLING: SHX6436

## 2019-06-10 HISTORY — DX: Anxiety disorder, unspecified: F41.9

## 2019-06-10 HISTORY — DX: Depression, unspecified: F32.A

## 2019-06-10 SURGERY — MAMMOPLASTY, REDUCTION, WITH MASTOPEXY
Anesthesia: General | Site: Breast | Laterality: Right

## 2019-06-10 MED ORDER — SODIUM CHLORIDE 0.9% FLUSH: 3.0000 mL | INTRAVENOUS | Status: DC | PRN

## 2019-06-10 MED ORDER — FENTANYL CITRATE (PF) 100 MCG/2ML IJ SOLN
50.0000 ug | INTRAMUSCULAR | Status: AC | PRN
  Administered 2019-06-10 (×4): 25 ug via INTRAVENOUS

## 2019-06-10 MED ORDER — SODIUM CHLORIDE 0.9 % IV SOLN: 250.0000 mL | INTRAVENOUS | Status: DC | PRN

## 2019-06-10 MED ORDER — EPINEPHRINE PF 1 MG/ML IJ SOLN
INTRAMUSCULAR | Status: AC
  Filled 2019-06-10: qty 1

## 2019-06-10 MED ORDER — MIDAZOLAM HCL 2 MG/2ML IJ SOLN
INTRAMUSCULAR | Status: AC
  Filled 2019-06-10: qty 2

## 2019-06-10 MED ORDER — FENTANYL CITRATE (PF) 100 MCG/2ML IJ SOLN
INTRAMUSCULAR | Status: AC
  Filled 2019-06-10: qty 2

## 2019-06-10 MED ORDER — LIDOCAINE HCL 1 % IJ SOLN: INTRAMUSCULAR | Status: DC | PRN

## 2019-06-10 MED ORDER — CEFAZOLIN SODIUM-DEXTROSE 2-4 GM/100ML-% IV SOLN
INTRAVENOUS | Status: AC
  Filled 2019-06-10: qty 100

## 2019-06-10 MED ORDER — FENTANYL CITRATE (PF) 100 MCG/2ML IJ SOLN
25.0000 ug | INTRAMUSCULAR | Status: DC | PRN
  Administered 2019-06-10 (×2): 50 ug via INTRAVENOUS
  Administered 2019-06-10: 25 ug via INTRAVENOUS

## 2019-06-10 MED ORDER — CHLORHEXIDINE GLUCONATE CLOTH 2 % EX PADS: 6.0000 | MEDICATED_PAD | Freq: Once | CUTANEOUS | Status: DC

## 2019-06-10 MED ORDER — LIDOCAINE HCL (PF) 1 % IJ SOLN
INTRAMUSCULAR | Status: AC
  Filled 2019-06-10: qty 90

## 2019-06-10 MED ORDER — ACETAMINOPHEN 10 MG/ML IV SOLN
INTRAVENOUS | Status: AC
  Filled 2019-06-10: qty 100

## 2019-06-10 MED ORDER — ONDANSETRON HCL 4 MG/2ML IJ SOLN: 4.0000 mg | Freq: Once | INTRAMUSCULAR | Status: DC | PRN

## 2019-06-10 MED ORDER — OXYCODONE HCL 5 MG PO TABS
5.0000 mg | ORAL_TABLET | ORAL | Status: DC | PRN
  Administered 2019-06-10: 5 mg via ORAL

## 2019-06-10 MED ORDER — LIDOCAINE HCL 1 % IJ SOLN: INTRAVENOUS | Status: DC | PRN

## 2019-06-10 MED ORDER — EPINEPHRINE PF 1 MG/ML IJ SOLN: INTRAMUSCULAR | Status: DC | PRN

## 2019-06-10 MED ORDER — PHENYLEPHRINE 40 MCG/ML (10ML) SYRINGE FOR IV PUSH (FOR BLOOD PRESSURE SUPPORT)
PREFILLED_SYRINGE | INTRAVENOUS | Status: DC | PRN
  Administered 2019-06-10: 40 ug via INTRAVENOUS
  Administered 2019-06-10: 80 ug via INTRAVENOUS
  Administered 2019-06-10 (×7): 40 ug via INTRAVENOUS

## 2019-06-10 MED ORDER — ONDANSETRON HCL 4 MG/2ML IJ SOLN
INTRAMUSCULAR | Status: DC | PRN
  Administered 2019-06-10: 4 mg via INTRAVENOUS

## 2019-06-10 MED ORDER — LACTATED RINGERS IV SOLN: INTRAVENOUS | Status: DC | PRN

## 2019-06-10 MED ORDER — SODIUM CHLORIDE 0.9 % IV SOLN
INTRAVENOUS | Status: AC
  Filled 2019-06-10: qty 500000

## 2019-06-10 MED ORDER — SODIUM CHLORIDE (PF) 0.9 % IJ SOLN
INTRAMUSCULAR | Status: AC
  Filled 2019-06-10: qty 30

## 2019-06-10 MED ORDER — OXYCODONE HCL 5 MG/5ML PO SOLN: 5.0000 mg | Freq: Once | ORAL | Status: DC | PRN

## 2019-06-10 MED ORDER — ACETAMINOPHEN 650 MG RE SUPP: 650.0000 mg | RECTAL | Status: DC | PRN

## 2019-06-10 MED ORDER — LACTATED RINGERS IV SOLN: INTRAVENOUS | Status: DC

## 2019-06-10 MED ORDER — CEFAZOLIN SODIUM-DEXTROSE 2-4 GM/100ML-% IV SOLN: 2.0000 g | INTRAVENOUS | Status: DC

## 2019-06-10 MED ORDER — ACETAMINOPHEN 325 MG PO TABS: 650.0000 mg | ORAL_TABLET | ORAL | Status: DC | PRN

## 2019-06-10 MED ORDER — MIDAZOLAM HCL 2 MG/2ML IJ SOLN
1.0000 mg | INTRAMUSCULAR | Status: DC | PRN
  Administered 2019-06-10: 10:00:00 1 mg via INTRAVENOUS

## 2019-06-10 MED ORDER — CEFAZOLIN SODIUM-DEXTROSE 2-4 GM/100ML-% IV SOLN
2.0000 g | INTRAVENOUS | Status: AC
  Administered 2019-06-10: 10:00:00 2 g via INTRAVENOUS

## 2019-06-10 MED ORDER — SODIUM CHLORIDE 0.9% FLUSH: 3.0000 mL | Freq: Two times a day (BID) | INTRAVENOUS | Status: DC

## 2019-06-10 MED ORDER — PROPOFOL 10 MG/ML IV BOLUS
INTRAVENOUS | Status: DC | PRN
  Administered 2019-06-10: 100 mg via INTRAVENOUS

## 2019-06-10 MED ORDER — OXYCODONE HCL 5 MG PO TABS: 5.0000 mg | ORAL_TABLET | Freq: Once | ORAL | Status: DC | PRN

## 2019-06-10 MED ORDER — OXYCODONE HCL 5 MG PO TABS
ORAL_TABLET | ORAL | Status: AC
  Filled 2019-06-10: qty 1

## 2019-06-10 MED ORDER — ACETAMINOPHEN 10 MG/ML IV SOLN
1000.0000 mg | Freq: Once | INTRAVENOUS | Status: AC
  Administered 2019-06-10: 1000 mg via INTRAVENOUS

## 2019-06-10 MED ORDER — LIDOCAINE-EPINEPHRINE 1 %-1:100000 IJ SOLN
INTRAMUSCULAR | Status: AC
  Filled 2019-06-10: qty 1

## 2019-06-10 MED ORDER — LIDOCAINE 2% (20 MG/ML) 5 ML SYRINGE
INTRAMUSCULAR | Status: DC | PRN
  Administered 2019-06-10: 40 mg via INTRAVENOUS

## 2019-06-10 SURGICAL SUPPLY — 77 items
BAG DECANTER FOR FLEXI CONT (MISCELLANEOUS) IMPLANT
BINDER ABDOMINAL  9 SM 30-45 (SOFTGOODS) ×2
BINDER ABDOMINAL 10 UNV 27-48 (MISCELLANEOUS) IMPLANT
BINDER ABDOMINAL 12 SM 30-45 (SOFTGOODS) IMPLANT
BINDER ABDOMINAL 9 SM 30-45 (SOFTGOODS) ×2 IMPLANT
BINDER BREAST LRG (GAUZE/BANDAGES/DRESSINGS) ×4 IMPLANT
BINDER BREAST MEDIUM (GAUZE/BANDAGES/DRESSINGS) IMPLANT
BINDER BREAST XLRG (GAUZE/BANDAGES/DRESSINGS) IMPLANT
BINDER BREAST XXLRG (GAUZE/BANDAGES/DRESSINGS) IMPLANT
BLADE HEX COATED 2.75 (ELECTRODE) ×4 IMPLANT
BLADE SURG 10 STRL SS (BLADE) ×4 IMPLANT
BLADE SURG 15 STRL LF DISP TIS (BLADE) ×2 IMPLANT
BLADE SURG 15 STRL SS (BLADE) ×2
BNDG GAUZE ELAST 4 BULKY (GAUZE/BANDAGES/DRESSINGS) IMPLANT
CANISTER SUCT 1200ML W/VALVE (MISCELLANEOUS) ×4 IMPLANT
CLOSURE WOUND 1/2 X4 (GAUZE/BANDAGES/DRESSINGS) ×2
COVER BACK TABLE 60X90IN (DRAPES) ×4 IMPLANT
COVER MAYO STAND STRL (DRAPES) ×4 IMPLANT
COVER WAND RF STERILE (DRAPES) IMPLANT
DECANTER SPIKE VIAL GLASS SM (MISCELLANEOUS) IMPLANT
DERMABOND ADVANCED (GAUZE/BANDAGES/DRESSINGS) ×2
DERMABOND ADVANCED .7 DNX12 (GAUZE/BANDAGES/DRESSINGS) ×2 IMPLANT
DRAIN CHANNEL 19F RND (DRAIN) IMPLANT
DRAPE LAPAROSCOPIC ABDOMINAL (DRAPES) ×4 IMPLANT
DRSG PAD ABDOMINAL 8X10 ST (GAUZE/BANDAGES/DRESSINGS) ×12 IMPLANT
ELECT BLADE 4.0 EZ CLEAN MEGAD (MISCELLANEOUS)
ELECT REM PT RETURN 9FT ADLT (ELECTROSURGICAL) ×4
ELECTRODE BLDE 4.0 EZ CLN MEGD (MISCELLANEOUS) IMPLANT
ELECTRODE REM PT RTRN 9FT ADLT (ELECTROSURGICAL) ×2 IMPLANT
EVACUATOR SILICONE 100CC (DRAIN) IMPLANT
EXTRACTOR CANIST REVOLVE STRL (CANNISTER) ×4 IMPLANT
GAUZE SPONGE 4X4 12PLY STRL (GAUZE/BANDAGES/DRESSINGS) IMPLANT
GAUZE SPONGE 4X4 12PLY STRL LF (GAUZE/BANDAGES/DRESSINGS) IMPLANT
GLOVE BIO SURGEON STRL SZ 6.5 (GLOVE) ×12 IMPLANT
GLOVE BIO SURGEONS STRL SZ 6.5 (GLOVE) ×4
GLOVE BIOGEL PI IND STRL 7.0 (GLOVE) ×2 IMPLANT
GLOVE BIOGEL PI INDICATOR 7.0 (GLOVE) ×2
GLOVE ECLIPSE 6.5 STRL STRAW (GLOVE) ×4 IMPLANT
GOWN STRL REUS W/ TWL LRG LVL3 (GOWN DISPOSABLE) ×8 IMPLANT
GOWN STRL REUS W/TWL LRG LVL3 (GOWN DISPOSABLE) ×8
IV LACTATED RINGERS 1000ML (IV SOLUTION) ×4 IMPLANT
LINER CANISTER 1000CC FLEX (MISCELLANEOUS) ×4 IMPLANT
NDL SAFETY ECLIPSE 18X1.5 (NEEDLE) ×2 IMPLANT
NEEDLE HYPO 18GX1.5 SHARP (NEEDLE) ×2
NEEDLE HYPO 25X1 1.5 SAFETY (NEEDLE) ×4 IMPLANT
NS IRRIG 1000ML POUR BTL (IV SOLUTION) IMPLANT
PACK BASIN DAY SURGERY FS (CUSTOM PROCEDURE TRAY) ×4 IMPLANT
PAD ALCOHOL SWAB (MISCELLANEOUS) ×4 IMPLANT
PENCIL SMOKE EVACUATOR (MISCELLANEOUS) ×4 IMPLANT
PIN SAFETY STERILE (MISCELLANEOUS) IMPLANT
SLEEVE SCD COMPRESS KNEE MED (MISCELLANEOUS) ×4 IMPLANT
SPONGE LAP 18X18 RF (DISPOSABLE) ×8 IMPLANT
STRIP CLOSURE SKIN 1/2X4 (GAUZE/BANDAGES/DRESSINGS) ×6 IMPLANT
STRIP SUTURE WOUND CLOSURE 1/2 (MISCELLANEOUS) ×4 IMPLANT
SUT MNCRL AB 4-0 PS2 18 (SUTURE) ×4 IMPLANT
SUT MON AB 3-0 SH 27 (SUTURE) ×2
SUT MON AB 3-0 SH27 (SUTURE) ×2 IMPLANT
SUT MON AB 4-0 PS1 27 (SUTURE) ×4 IMPLANT
SUT MON AB 5-0 PS2 18 (SUTURE) ×4 IMPLANT
SUT SILK 3 0 PS 1 (SUTURE) IMPLANT
SUT VIC AB 3-0 SH 27 (SUTURE) ×2
SUT VIC AB 3-0 SH 27X BRD (SUTURE) ×2 IMPLANT
SYR 10ML LL (SYRINGE) ×16 IMPLANT
SYR 3ML 18GX1 1/2 (SYRINGE) IMPLANT
SYR 50ML LL SCALE MARK (SYRINGE) IMPLANT
SYR BULB IRRIGATION 50ML (SYRINGE) ×4 IMPLANT
SYR CONTROL 10ML LL (SYRINGE) ×4 IMPLANT
SYR TOOMEY 50ML (SYRINGE) IMPLANT
TAPE MEASURE VINYL STERILE (MISCELLANEOUS) IMPLANT
TOWEL GREEN STERILE FF (TOWEL DISPOSABLE) ×8 IMPLANT
TRAY DSU PREP LF (CUSTOM PROCEDURE TRAY) ×4 IMPLANT
TUBE CONNECTING 20'X1/4 (TUBING) ×1
TUBE CONNECTING 20X1/4 (TUBING) ×3 IMPLANT
TUBING INFILTRATION IT-10001 (TUBING) ×4 IMPLANT
TUBING SET GRADUATE ASPIR 12FT (MISCELLANEOUS) ×4 IMPLANT
UNDERPAD 30X36 HEAVY ABSORB (UNDERPADS AND DIAPERS) ×8 IMPLANT
YANKAUER SUCT BULB TIP NO VENT (SUCTIONS) ×4 IMPLANT

## 2019-06-10 NOTE — Anesthesia Preprocedure Evaluation (Signed)
Anesthesia Evaluation  Patient identified by MRN, date of birth, ID band Patient awake    Reviewed: Allergy & Precautions, NPO status , Patient's Chart, lab work & pertinent test results  History of Anesthesia Complications Negative for: history of anesthetic complications  Airway Mallampati: II  TM Distance: >3 FB Neck ROM: Full    Dental  (+) Edentulous Upper, Edentulous Lower   Pulmonary neg pulmonary ROS, former smoker,    Pulmonary exam normal        Cardiovascular negative cardio ROS Normal cardiovascular exam     Neuro/Psych PSYCHIATRIC DISORDERS Anxiety Depression negative neurological ROS     GI/Hepatic Neg liver ROS, hiatal hernia, GERD  ,Esophageal stricture   Endo/Other  negative endocrine ROS  Renal/GU negative Renal ROS  negative genitourinary   Musculoskeletal negative musculoskeletal ROS (+)   Abdominal   Peds  Hematology negative hematology ROS (+)   Anesthesia Other Findings H/o vocal cord cancer s/p surgical excision/XRT Breast cancer  Reproductive/Obstetrics                             Anesthesia Physical Anesthesia Plan  ASA: III  Anesthesia Plan: General   Post-op Pain Management:    Induction: Intravenous  PONV Risk Score and Plan: 3 and Ondansetron, Dexamethasone, Midazolam and Treatment may vary due to age or medical condition  Airway Management Planned: LMA  Additional Equipment: None  Intra-op Plan:   Post-operative Plan: Extubation in OR  Informed Consent: I have reviewed the patients History and Physical, chart, labs and discussed the procedure including the risks, benefits and alternatives for the proposed anesthesia with the patient or authorized representative who has indicated his/her understanding and acceptance.       Plan Discussed with:   Anesthesia Plan Comments:         Anesthesia Quick Evaluation

## 2019-06-10 NOTE — Op Note (Signed)
Breast Reduction Op note:    DATE OF PROCEDURE: 06/10/2019  LOCATION: [facility]  SURGEON: Lyndee Leo Sanger Charisse Wendell, DO  ASSISTANT: Phoebe Sharps, PA  PREOPERATIVE DIAGNOSIS 1. Breast Asymmetry after treatment for breast cancer. 2. Contracture of right breast due to surgery for cancer and radiation. 3. Macromastia / Neck Pain /  Back Pain.  POSTOPERATIVE DIAGNOSIS Same  PROCEDURES 1. Left breast reduction / mastectomy.   55 g 2. Right breast contracture release and excision and fat filling   COMPLICATIONS: None.  DRAINS:  None  INDICATIONS FOR PROCEDURE Denise Watts is a 64 y.o. year-old female born on 01-Jan-1956,with a history of breast cancer on the right.  She underwent a partial mastectomy.  She then had radiation.  This lead to marked breast asymmetry and right breast contracture.  It was becoming difficult to clean the area and difficult to fit into a bra.  She also complained of macromastia with concominant back pain, neck pain, shoulder grooving from her bra made worse by the asymmetry.   MRN: [id number]  CONSENT Informed consent was obtained directly from the patient. The risks, benefits and alternatives were fully discussed. Specific risks including but not limited to bleeding, infection, hematoma, seroma, scarring, pain, nipple necrosis, asymmetry, poor cosmetic results, and need for further surgery were discussed. The patient had ample opportunity to have her questions answered to her satisfaction.  DESCRIPTION OF PROCEDURE  Patient was brought into the operating room and placed in a supine position.  SCDs were placed and appropriate padding was performed.  Antibiotics were given. The patient underwent general anesthesia and the chest was prepped and draped in a sterile fashion.  A timeout was performed and all information was confirmed to be correct.  Left side: Preoperative markings were confirmed.  Incision lines were injected with 1% Xylocaine  with epinephrine.  After waiting for vasoconstriction, the marked lines were incised.  A Wise-pattern superomedial breast reduction was performed by de-epithelializing the pedicle, using bovie to create the superomedial pedicle, and removing breast tissue from the superior, lateral, and inferior portions of the breast.  Care was taken to not undermine the breast pedicle. Hemostasis was achieved.  The nipple was gently rotated into position and the soft tissue was closed with 4-0 Monocryl.  The patient was sat upright and size and shape symmetry was confirmed.  The pocket was irrigated and hemostasis confirmed.  The deep tissues were approximated with 3-0 monocryl sutures and the skin was closed with deep dermal and subcuticular 4-0 Monocryl sutures.  Dermabond was applied.  A breast binder and ABDs were placed.  The nipple and skin flaps had good capillary refill at the end of the procedure.    Right side: Preoperative markings were confirmed.  Local with epinephrine was injected at the lateral inframammary fold.  After waiting for vasoconstriction, the forked knife was inserted and used to excise and release the 2 x 4 cm area of contracted / scared tissue of the inferior right breast.  Tumescent was infused into the abdominal fat.  Liposuction was done with the donor fat being collected in the revolve and washed per the manufacture guidelines.  The fat was then injected into the right breast space that was created at the inferior breast.  A total of 100 cc was injected.  This was done to prevent further contracture.  The nipple and skin flaps had good capillary refill at the end of the procedure. All incisions were closed with the 5-0 Monocryl. The  patient tolerated the procedure well. The patient was allowed to wake from anesthesia and taken to the recovery room in satisfactory condition.  The advanced practice practitioner (APP) assisted throughout the case.  The APP was essential in retraction and counter  traction when needed to make the case progress smoothly.  This retraction and assistance made it possible to see the tissue plans for the procedure.  The assistance was needed for blood control, tissue re-approximation and assisted with closure of the incision site.   The [place] was signed into law in 2016 which includes the topic of electronic health records.  This provides immediate access to information in MyChart.  This includes consultation notes, operative notes, office notes, lab results and pathology reports.  If you have any questions about what you read please let us know at your next visit or call us at the office.  We are right here with you.

## 2019-06-10 NOTE — Anesthesia Postprocedure Evaluation (Signed)
Anesthesia Post Note  Patient: Denise Watts  Procedure(s) Performed: Left breast mastopexy reduction for symmetry (Left Breast) right breast scar and capsule release with fat grafting (Right Breast)     Patient location during evaluation: PACU Anesthesia Type: General Level of consciousness: awake and alert Pain management: pain level controlled Vital Signs Assessment: post-procedure vital signs reviewed and stable Respiratory status: spontaneous breathing, nonlabored ventilation and respiratory function stable Cardiovascular status: blood pressure returned to baseline and stable Postop Assessment: no apparent nausea or vomiting Anesthetic complications: no    Last Vitals:  Vitals:   06/10/19 1230 06/10/19 1257  BP: 123/76   Pulse: (!) 106   Resp: 12 16  Temp:    SpO2: 98%     Last Pain:  Vitals:   06/10/19 1253  TempSrc:   PainSc: 3                  Lidia Collum

## 2019-06-10 NOTE — Interval H&P Note (Signed)
History and Physical Interval Note:  06/10/2019 9:07 AM  Denise Watts  has presented today for surgery, with the diagnosis of Status Post Partial Mastectomy Of Right Breast; Postoperative breast asymmetry.  The various methods of treatment have been discussed with the patient and family. After consideration of risks, benefits and other options for treatment, the patient has consented to  Procedure(s) with comments: Left breast mastopexy reduction for symmetry (Left) - total 2.5 hours right breast scar and capsule release with fat grafting (Right) as a surgical intervention.  The patient's history has been reviewed, patient examined, no change in status, stable for surgery.  I have reviewed the patient's chart and labs.  Questions were answered to the patient's satisfaction.     Loel Lofty Mattie Nordell

## 2019-06-10 NOTE — Anesthesia Procedure Notes (Signed)
Procedure Name: LMA Insertion Date/Time: 06/10/2019 9:46 AM Performed by: Gwyndolyn Saxon, CRNA Pre-anesthesia Checklist: Patient identified, Emergency Drugs available, Suction available and Patient being monitored Patient Re-evaluated:Patient Re-evaluated prior to induction Oxygen Delivery Method: Circle System Utilized Preoxygenation: Pre-oxygenation with 100% oxygen Induction Type: IV induction Ventilation: Mask ventilation without difficulty LMA: LMA inserted LMA Size: 3.0 Number of attempts: 1 Airway Equipment and Method: Bite block Placement Confirmation: positive ETCO2 Tube secured with: Tape Dental Injury: Teeth and Oropharynx as per pre-operative assessment

## 2019-06-10 NOTE — Transfer of Care (Signed)
Immediate Anesthesia Transfer of Care Note  Patient: Denise Watts  Procedure(s) Performed: Left breast mastopexy reduction for symmetry (Left Breast) right breast scar and capsule release with fat grafting (Right Breast)  Patient Location: PACU  Anesthesia Type:General  Level of Consciousness: drowsy  Airway & Oxygen Therapy: Patient Spontanous Breathing and Patient connected to face mask oxygen  Post-op Assessment: Report given to RN and Post -op Vital signs reviewed and stable  Post vital signs: Reviewed and stable  Last Vitals:  Vitals Value Taken Time  BP 145/91 06/10/19 1118  Temp    Pulse 97 06/10/19 1121  Resp 13 06/10/19 1121  SpO2 100 % 06/10/19 1121  Vitals shown include unvalidated device data.  Last Pain:  Vitals:   06/10/19 0853  TempSrc: Oral  PainSc: 0-No pain      Patients Stated Pain Goal: 3 ([phone] [id number])  Complications: No apparent anesthesia complications

## 2019-06-10 NOTE — Discharge Instructions (Signed)
INSTRUCTIONS FOR AFTER BREAST SURGERY   You are getting ready to undergo breast surgery.  You will likely have some questions about what to expect following your operation.  The following information will help you and your family understand what to expect when you are discharged from the hospital.  Following these guidelines will help ensure a smooth recovery and reduce risks of complications.   Postoperative instructions include information on: diet, wound care, medications and physical activity.  AFTER SURGERY Expect to go home after the procedure.  In some cases, you may need to spend one night in the hospital for observation.  DIET Breast surgery does not require a specific diet.  However, the healthier you eat the better your body can start healing. It is important to increasing your protein intake.  This means limiting the foods with sugar and carbohydrates.  Focus on vegetables and some meat.  If you have any liposuction during your procedure be sure to drink water.  If your urine is bright yellow, then it is concentrated, and you need to drink more water.  As a general rule after surgery, you should have 8 ounces of water every hour while awake.  If you find you are persistently nauseated or unable to take in liquids let us know.  NO TOBACCO USE or EXPOSURE.  This will slow your healing process and increase the risk of a wound.  WOUND CARE If you don't have a drain:  You can shower the day after surgery. Use fragrance free soap.  Dial, [place] and [place] are usually mild on the skin. If you have a drain: You can shower five days after surgery.  Clean with baby wipes until the drain is removed.    If you have steri-strips / tape directly attached to your skin leave them in place. It is OK to get these wet.  No baths, pools or hot tubs for two weeks. We close your incision to leave the smallest and best-looking scar. No ointment or creams on your incisions until given the go ahead.  Especially not  Neosporin (Too many skin reactions with this one).  A few weeks after surgery you can use Mederma and start massaging the scar. We ask you to wear your binder or sports bra for the first 6 weeks around the clock, including while sleeping. This provides added comfort and helps reduce the fluid accumulation at the surgery site.  ACTIVITY No heavy lifting until cleared by the doctor.  This usually means no more than a half-gallon of milk.  It is OK to walk and climb stairs. In fact, moving your legs is very important to decrease your risk of a blood clot.  It will also help keep you from getting deconditioned.  Every 1 to 2 hours get up and walk for 5 minutes. This will help with a quicker recovery back to normal.  Let pain be your guide so you don't do too much.  This is not the time for spring cleaning and don't plan on taking care of anyone else.  This time is for you to recover,  You will be more comfortable if you sleep and rest with your head elevated either with a few pillows under you or in a recliner.  No stomach sleeping for a three months.  WORK Everyone returns to work at different times. As a rough guide, most people take at least 1 - 2 weeks off prior to returning to work. If you need documentation for your job,  bring the forms to your postoperative follow up visit.  DRIVING Arrange for someone to bring you home from the hospital.  You may be able to drive a few days after surgery but not while taking any narcotics or valium.  BOWEL MOVEMENTS Constipation can occur after anesthesia and while taking pain medication.  It is important to stay ahead for your comfort.  We recommend taking Milk of Magnesia (2 tablespoons; twice a day) while taking the pain pills.  SEROMA This is fluid your body tried to put in the surgical site.  This is normal but if it creates tight skinny skin let us know.  It usually decreases in a few weeks.  MEDICATIONS and PAIN CONTROL At your preoperative visit for  you history and physical you were given the following medications: 1. An antibiotic: Start this medication when you get home and take according to the instructions on the bottle. 2. Zofran 4 mg:  This is to treat nausea and vomiting.  You can take this every 6 hours as needed and only if needed. 3. Valium 2 mg: This is for muscle tightness if you have an implant or expander. This will help relax your muscle which also helps with pain control.  This can be taken every 12 hours as needed.  Don't drive after taking this medication. 4. Norco (hydrocodone/acetaminophen) 5/325 mg:  This is only to be used after you have taken the motrin or the tylenol. Every 8 hours as needed. Over the counter Medication to take: 5. Ibuprofen (Motrin) 600 mg:  Take this every 6 hours.  If you have additional pain then take 500 mg of the tylenol every 8 hours.  Only take the Norco after you have tried these two. 6. Miralax or stool softener of choice: Take this according to the bottle if you take the [place] Call your surgeon's office if any of the following occur: . Fever 101 degrees F or greater . Excessive bleeding or fluid from the incision site. . Pain that increases over time without aid from the medications . Redness, warmth, or pus draining from incision sites . Persistent nausea or inability to take in liquids . Severe misshapen area that underwent the operation.    Post Anesthesia Home Care Instructions  Activity: Get plenty of rest for the remainder of the day. A responsible individual must stay with you for 24 hours following the procedure.  For the next 24 hours, DO NOT: -Drive a car -Paediatric nurse -Drink alcoholic beverages -Take any medication unless instructed by your physician -Make any legal decisions or sign important papers.  Meals: Start with liquid foods such as gelatin or soup. Progress to regular foods as tolerated. Avoid greasy, spicy, heavy foods. If nausea and/or  vomiting occur, drink only clear liquids until the nausea and/or vomiting subsides. Call your physician if vomiting continues.  Special Instructions/Symptoms: Your throat may feel dry or sore from the anesthesia or the breathing tube placed in your throat during surgery. If this causes discomfort, gargle with warm salt water. The discomfort should disappear within 24 hours.  If you had a scopolamine patch placed behind your ear for the management of post- operative nausea and/or vomiting:  1. The medication in the patch is effective for 72 hours, after which it should be removed.  Wrap patch in a tissue and discard in the trash. Wash hands thoroughly with soap and water. 2. You may remove the patch earlier than 72 hours if you experience  unpleasant side effects which may include dry mouth, dizziness or visual disturbances. 3. Avoid touching the patch. Wash your hands with soap and water after contact with the patch.        NO TYLENOL PRODUCTS UNTIL 6:17 pm

## 2019-06-11 ENCOUNTER — Telehealth: Payer: Self-pay

## 2019-06-11 ENCOUNTER — Encounter: Payer: Self-pay | Admitting: *Deleted

## 2019-06-11 DIAGNOSIS — C44729 Squamous cell carcinoma of skin of left lower limb, including hip: Secondary | ICD-10-CM | POA: Diagnosis not present

## 2019-06-11 DIAGNOSIS — Z08 Encounter for follow-up examination after completed treatment for malignant neoplasm: Secondary | ICD-10-CM | POA: Diagnosis not present

## 2019-06-11 DIAGNOSIS — C44629 Squamous cell carcinoma of skin of left upper limb, including shoulder: Secondary | ICD-10-CM | POA: Diagnosis not present

## 2019-06-11 DIAGNOSIS — C44329 Squamous cell carcinoma of skin of other parts of face: Secondary | ICD-10-CM | POA: Diagnosis not present

## 2019-06-11 DIAGNOSIS — Z85828 Personal history of other malignant neoplasm of skin: Secondary | ICD-10-CM | POA: Diagnosis not present

## 2019-06-11 DIAGNOSIS — X32XXXA Exposure to sunlight, initial encounter: Secondary | ICD-10-CM | POA: Diagnosis not present

## 2019-06-11 DIAGNOSIS — D485 Neoplasm of uncertain behavior of skin: Secondary | ICD-10-CM | POA: Diagnosis not present

## 2019-06-11 DIAGNOSIS — D0439 Carcinoma in situ of skin of other parts of face: Secondary | ICD-10-CM | POA: Diagnosis not present

## 2019-06-11 DIAGNOSIS — L570 Actinic keratosis: Secondary | ICD-10-CM | POA: Diagnosis not present

## 2019-06-11 DIAGNOSIS — R208 Other disturbances of skin sensation: Secondary | ICD-10-CM | POA: Diagnosis not present

## 2019-06-11 DIAGNOSIS — C44612 Basal cell carcinoma of skin of right upper limb, including shoulder: Secondary | ICD-10-CM | POA: Diagnosis not present

## 2019-06-11 LAB — SURGICAL PATHOLOGY

## 2019-06-11 MED ORDER — OXYCODONE-ACETAMINOPHEN 7.5-325 MG PO TABS: 1.0000 | ORAL_TABLET | Freq: Three times a day (TID) | ORAL | 0 refills | Status: AC | PRN

## 2019-06-11 NOTE — Telephone Encounter (Signed)
Received call from Denise Watts complaining of severe pain. She states the hydrocodone and Tylenol have not helped to reduce her pain levels.

## 2019-06-12 DIAGNOSIS — R42 Dizziness and giddiness: Secondary | ICD-10-CM | POA: Diagnosis not present

## 2019-06-12 DIAGNOSIS — R1310 Dysphagia, unspecified: Secondary | ICD-10-CM | POA: Diagnosis not present

## 2019-06-12 NOTE — Telephone Encounter (Signed)
Dr. Janalyn Shy called and spoke with the patient on (06/11/19),and 2 new prescriptions were sent to the patient's pharmacy.   Patient called today to say she was feeling better.//AB/CMA

## 2019-06-13 DIAGNOSIS — M7582 Other shoulder lesions, left shoulder: Secondary | ICD-10-CM | POA: Diagnosis not present

## 2019-06-13 DIAGNOSIS — G894 Chronic pain syndrome: Secondary | ICD-10-CM | POA: Diagnosis not present

## 2019-06-17 NOTE — Progress Notes (Signed)
Subjective:     Patient ID: Denise Watts, female    DOB: [birth year], 64 y.o.   MRN: [id number]  Chief Complaint  Patient presents with  . Breast Problem    HPI: The patient is a 64 y.o. female here for follow-up after left breast reduction/mastopexy 55 g and right breast contracture release and excision and lipofilling on 06/10/2019 with Dr. Marla Roe.  Ms. Roehrig reports she is doing very well today.  Reports pain that is well controlled with alternating Motrin and Oxycodone 7.5-325 every 4 hours.  Denies F, N/V, CP, SOB. Reports bruising on her abdomen from the lipo.  States she is very pleased with the results. No BM yet.    Review of Systems  Constitutional: Negative for chills and fever.  Respiratory: Negative for shortness of breath.   Cardiovascular: Negative for chest pain.  Gastrointestinal: Positive for constipation. Negative for diarrhea, nausea and vomiting.  Skin: Positive for color change (bruising on abdomen). Negative for pallor and rash.     Objective:   Vital Signs BP 131/85 (BP Location: Left Arm, Patient Position: Sitting, Cuff Size: Normal)   Pulse 77   Temp (!) 97.5 F (36.4 C) (Temporal)   Ht 5\' 1"  (1.549 m)   Wt 126 lb (57.2 kg)   SpO2 99%   BMI 23.81 kg/m  Vital Signs and Nursing Note Reviewed  Physical Exam  Constitutional: She is oriented to person, place, and time and well-developed, well-nourished, and in no distress.  HENT:  Head: Normocephalic and atraumatic.  Eyes: EOM are normal.  Pulmonary/Chest: Effort normal.  Incisions healing well bilaterally, c/d/i. No signs of infection, redness, drainage, seroma/hematoma.   Abdominal:  Bruising on lower abdomen and above pubic bone  Musculoskeletal:        General: Normal range of motion.     Cervical back: Normal range of motion.  Neurological: She is alert and oriented to person, place, and time. Gait normal.  Skin: Skin is warm and dry. No rash noted. No erythema. No pallor.  Psychiatric:  Mood, memory, affect and judgment normal.      Assessment/Plan:     ICD-10-CM   1. Postoperative breast asymmetry  N64.89     Ms. Razzano is doing well today and is very pleased with her results.  Incisions healing well bilaterally, C/D/I.  No signs of infection, redness, drainage, seroma/hematoma.  Bruising on her lower abdomen and above the pubic bone.  She is concerned with pain and has been currently controlling pain with alternating Motrin and Oxy 7.5-325 every 4 hours.  She asked for additional pain medication and I will call in 3 more days.  I have instructed her to start alternating Motrin and Tylenol and only using the Oxy 7.5-325 for severe pain.  She is currently constipated.  Recommend starting MiraLAX and drinking a lot of water.  We discussed that a side effect of the make pain medication is constipation and the need to reserve the pain medication for severe pain moving forward.  She verbalized understanding.  Follow-up in 2 weeks.  Call office with any questions/concerns.  The [place] was signed into law in 2016 which includes the topic of electronic health records.  This provides immediate access to information in MyChart.  This includes consultation notes, operative notes, office notes, lab results and pathology reports.  If you have any questions about what you read please let us know at your next visit or call us at the office.  We are right here with you.   Threasa Heads, PA-C 06/18/2019, 10:49 AM

## 2019-06-18 ENCOUNTER — Other Ambulatory Visit: Payer: Self-pay

## 2019-06-18 ENCOUNTER — Encounter: Payer: Self-pay | Admitting: Plastic Surgery

## 2019-06-18 ENCOUNTER — Ambulatory Visit (INDEPENDENT_AMBULATORY_CARE_PROVIDER_SITE_OTHER): Payer: Medicare HMO | Admitting: Plastic Surgery

## 2019-06-18 VITALS — BP 131/85 | HR 77 | Temp 97.5°F | Ht 61.0 in | Wt 126.0 lb

## 2019-06-18 DIAGNOSIS — N6489 Other specified disorders of breast: Secondary | ICD-10-CM

## 2019-06-18 MED ORDER — OXYCODONE-ACETAMINOPHEN 7.5-325 MG PO TABS: 1.0000 | ORAL_TABLET | Freq: Three times a day (TID) | ORAL | 0 refills | Status: AC | PRN

## 2019-06-21 ENCOUNTER — Inpatient Hospital Stay: Payer: Medicare HMO | Attending: Oncology

## 2019-06-21 DIAGNOSIS — C7491 Malignant neoplasm of unspecified part of right adrenal gland: Secondary | ICD-10-CM | POA: Insufficient documentation

## 2019-06-21 DIAGNOSIS — C320 Malignant neoplasm of glottis: Secondary | ICD-10-CM | POA: Insufficient documentation

## 2019-06-21 DIAGNOSIS — C50511 Malignant neoplasm of lower-outer quadrant of right female breast: Secondary | ICD-10-CM | POA: Insufficient documentation

## 2019-06-21 DIAGNOSIS — E538 Deficiency of other specified B group vitamins: Secondary | ICD-10-CM | POA: Insufficient documentation

## 2019-06-21 DIAGNOSIS — Z171 Estrogen receptor negative status [ER-]: Secondary | ICD-10-CM | POA: Insufficient documentation

## 2019-06-24 ENCOUNTER — Telehealth: Payer: Self-pay | Admitting: *Deleted

## 2019-06-24 NOTE — Telephone Encounter (Signed)
Pt called to say that she missed b12 inj. And needs to be r/s. Changed it to 2/23 and the one in march to 3/23 both at 10 am. Pt is agreeable tothis

## 2019-06-25 ENCOUNTER — Other Ambulatory Visit: Payer: Self-pay

## 2019-06-25 ENCOUNTER — Inpatient Hospital Stay: Payer: Medicare HMO

## 2019-06-25 DIAGNOSIS — Z23 Encounter for immunization: Secondary | ICD-10-CM

## 2019-06-25 DIAGNOSIS — C320 Malignant neoplasm of glottis: Secondary | ICD-10-CM | POA: Diagnosis not present

## 2019-06-25 DIAGNOSIS — E538 Deficiency of other specified B group vitamins: Secondary | ICD-10-CM

## 2019-06-25 DIAGNOSIS — C50511 Malignant neoplasm of lower-outer quadrant of right female breast: Secondary | ICD-10-CM | POA: Diagnosis not present

## 2019-06-25 DIAGNOSIS — Z171 Estrogen receptor negative status [ER-]: Secondary | ICD-10-CM | POA: Diagnosis not present

## 2019-06-25 DIAGNOSIS — C7491 Malignant neoplasm of unspecified part of right adrenal gland: Secondary | ICD-10-CM | POA: Diagnosis not present

## 2019-06-25 MED ORDER — CYANOCOBALAMIN 1000 MCG/ML IJ SOLN
1000.0000 ug | INTRAMUSCULAR | Status: DC
  Administered 2019-06-25: 1000 ug via INTRAMUSCULAR
  Filled 2019-06-25: qty 1

## 2019-07-01 DIAGNOSIS — M79602 Pain in left arm: Secondary | ICD-10-CM | POA: Diagnosis not present

## 2019-07-01 DIAGNOSIS — E538 Deficiency of other specified B group vitamins: Secondary | ICD-10-CM | POA: Diagnosis not present

## 2019-07-01 DIAGNOSIS — E519 Thiamine deficiency, unspecified: Secondary | ICD-10-CM | POA: Diagnosis not present

## 2019-07-01 DIAGNOSIS — M25512 Pain in left shoulder: Secondary | ICD-10-CM | POA: Diagnosis not present

## 2019-07-01 DIAGNOSIS — R404 Transient alteration of awareness: Secondary | ICD-10-CM | POA: Diagnosis not present

## 2019-07-01 DIAGNOSIS — E559 Vitamin D deficiency, unspecified: Secondary | ICD-10-CM | POA: Diagnosis not present

## 2019-07-01 DIAGNOSIS — R42 Dizziness and giddiness: Secondary | ICD-10-CM | POA: Diagnosis not present

## 2019-07-01 DIAGNOSIS — G540 Brachial plexus disorders: Secondary | ICD-10-CM | POA: Diagnosis not present

## 2019-07-01 DIAGNOSIS — E531 Pyridoxine deficiency: Secondary | ICD-10-CM | POA: Diagnosis not present

## 2019-07-01 DIAGNOSIS — G8929 Other chronic pain: Secondary | ICD-10-CM | POA: Diagnosis not present

## 2019-07-02 ENCOUNTER — Other Ambulatory Visit: Payer: Self-pay

## 2019-07-02 ENCOUNTER — Encounter: Payer: Self-pay | Admitting: Plastic Surgery

## 2019-07-02 ENCOUNTER — Ambulatory Visit (INDEPENDENT_AMBULATORY_CARE_PROVIDER_SITE_OTHER): Payer: Medicare HMO | Admitting: Plastic Surgery

## 2019-07-02 VITALS — BP 135/84 | HR 82 | Temp 97.7°F | Ht 61.0 in | Wt 127.6 lb

## 2019-07-02 DIAGNOSIS — Z9011 Acquired absence of right breast and nipple: Secondary | ICD-10-CM

## 2019-07-02 DIAGNOSIS — N6489 Other specified disorders of breast: Secondary | ICD-10-CM

## 2019-07-02 NOTE — Progress Notes (Signed)
   Subjective:    Patient ID: Denise Watts, female    DOB: [birth year], 64 y.o.   MRN: [id number]  Patient is a very sweet 64 year old white female here for follow-up after undergoing breast surgery.  She had release of scar contracture to the right and a mastopexy on the left.  She has bruising as expected.  There is no sign of infection or seroma.  She is very pleased with her results.  The pain is well managed with the medication.     Review of Systems  Constitutional: Positive for activity change. Negative for appetite change.  Respiratory: Negative.  Negative for shortness of breath.   Cardiovascular: Negative.  Negative for leg swelling.  Gastrointestinal: Positive for abdominal pain.  Endocrine: Negative.   Genitourinary: Negative.   Musculoskeletal: Negative.        Objective:   Physical Exam Vitals and nursing note reviewed.  Constitutional:      Appearance: Normal appearance.  Cardiovascular:     Rate and Rhythm: Normal rate.     Pulses: Normal pulses.  Pulmonary:     Effort: Pulmonary effort is normal.  Skin:    General: Skin is warm.  Neurological:     General: No focal deficit present.     Mental Status: She is alert and oriented to person, place, and time.  Psychiatric:        Mood and Affect: Mood normal.        Behavior: Behavior normal.        Thought Content: Thought content normal.        Assessment & Plan:     ICD-10-CM   1. Status post partial mastectomy of right breast  Z90.11   2. Postoperative breast asymmetry  N64.89     The patient can now go into a spanks and sports bra.  Continue walking and lots of water intake.  I would like to see her back in 1 to 2 weeks.  Probably at that time will start having her do some massage to the right breast.

## 2019-07-04 DIAGNOSIS — C44329 Squamous cell carcinoma of skin of other parts of face: Secondary | ICD-10-CM | POA: Diagnosis not present

## 2019-07-09 ENCOUNTER — Other Ambulatory Visit (HOSPITAL_COMMUNITY): Payer: Self-pay | Admitting: Neurology

## 2019-07-09 ENCOUNTER — Other Ambulatory Visit: Payer: Self-pay | Admitting: Neurology

## 2019-07-09 DIAGNOSIS — R404 Transient alteration of awareness: Secondary | ICD-10-CM

## 2019-07-09 DIAGNOSIS — G540 Brachial plexus disorders: Secondary | ICD-10-CM

## 2019-07-11 DIAGNOSIS — D485 Neoplasm of uncertain behavior of skin: Secondary | ICD-10-CM | POA: Diagnosis not present

## 2019-07-11 DIAGNOSIS — B079 Viral wart, unspecified: Secondary | ICD-10-CM | POA: Diagnosis not present

## 2019-07-11 DIAGNOSIS — C44629 Squamous cell carcinoma of skin of left upper limb, including shoulder: Secondary | ICD-10-CM | POA: Diagnosis not present

## 2019-07-15 DIAGNOSIS — M7532 Calcific tendinitis of left shoulder: Secondary | ICD-10-CM | POA: Diagnosis not present

## 2019-07-15 DIAGNOSIS — M7582 Other shoulder lesions, left shoulder: Secondary | ICD-10-CM | POA: Diagnosis not present

## 2019-07-16 ENCOUNTER — Encounter: Payer: Self-pay | Admitting: Plastic Surgery

## 2019-07-16 ENCOUNTER — Other Ambulatory Visit: Payer: Self-pay

## 2019-07-16 ENCOUNTER — Ambulatory Visit (INDEPENDENT_AMBULATORY_CARE_PROVIDER_SITE_OTHER): Payer: Medicare HMO | Admitting: Plastic Surgery

## 2019-07-16 VITALS — BP 142/85 | HR 88 | Temp 97.1°F | Ht 61.0 in | Wt 125.4 lb

## 2019-07-16 DIAGNOSIS — N6489 Other specified disorders of breast: Secondary | ICD-10-CM

## 2019-07-16 DIAGNOSIS — Z9011 Acquired absence of right breast and nipple: Secondary | ICD-10-CM

## 2019-07-16 NOTE — Progress Notes (Signed)
The patient is a 64 year old female here for follow-up after undergoing breast surgery.  She had scar contracture release of the left breast.  Fat grafting was done to help prevent the scar contracture from reoccurring.  She had a mastopexy on the left breast.  She is doing extremely well.  The right breast is a little bit tight as expected for the surgery.  There is no sign of redness or infection.  She can start massaging the right lateral breast.  Continue with the sports bra.  I would like to see her back in 3 months. Pictures were obtained of the patient and placed in the chart with the patient's or guardian's permission.

## 2019-07-18 DIAGNOSIS — C44729 Squamous cell carcinoma of skin of left lower limb, including hip: Secondary | ICD-10-CM | POA: Diagnosis not present

## 2019-07-19 ENCOUNTER — Inpatient Hospital Stay: Payer: Medicare HMO

## 2019-07-23 ENCOUNTER — Ambulatory Visit
Admission: RE | Admit: 2019-07-23 | Discharge: 2019-07-23 | Disposition: A | Discharge status: Home / Self Care | Payer: Medicare HMO | Source: Ambulatory Visit | Attending: Neurology | Admitting: Neurology

## 2019-07-23 ENCOUNTER — Other Ambulatory Visit: Payer: Self-pay

## 2019-07-23 ENCOUNTER — Inpatient Hospital Stay: Payer: Medicare HMO | Attending: Oncology

## 2019-07-23 DIAGNOSIS — G540 Brachial plexus disorders: Secondary | ICD-10-CM | POA: Diagnosis not present

## 2019-07-23 DIAGNOSIS — R42 Dizziness and giddiness: Secondary | ICD-10-CM | POA: Diagnosis not present

## 2019-07-23 DIAGNOSIS — R404 Transient alteration of awareness: Secondary | ICD-10-CM | POA: Insufficient documentation

## 2019-07-23 DIAGNOSIS — E538 Deficiency of other specified B group vitamins: Secondary | ICD-10-CM | POA: Insufficient documentation

## 2019-07-23 DIAGNOSIS — M25512 Pain in left shoulder: Secondary | ICD-10-CM | POA: Diagnosis not present

## 2019-07-23 DIAGNOSIS — Z23 Encounter for immunization: Secondary | ICD-10-CM

## 2019-07-23 MED ORDER — GADOBUTROL 1 MMOL/ML IV SOLN
5.0000 mL | Freq: Once | INTRAVENOUS | Status: AC | PRN
  Administered 2019-07-23: 5 mL via INTRAVENOUS

## 2019-07-23 MED ORDER — CYANOCOBALAMIN 1000 MCG/ML IJ SOLN
1000.0000 ug | INTRAMUSCULAR | Status: DC
  Administered 2019-07-23: 1000 ug via INTRAMUSCULAR
  Filled 2019-07-23: qty 1

## 2019-08-06 DIAGNOSIS — M25512 Pain in left shoulder: Secondary | ICD-10-CM | POA: Diagnosis not present

## 2019-08-06 DIAGNOSIS — G8929 Other chronic pain: Secondary | ICD-10-CM | POA: Diagnosis not present

## 2019-08-06 DIAGNOSIS — M79602 Pain in left arm: Secondary | ICD-10-CM | POA: Diagnosis not present

## 2019-08-14 ENCOUNTER — Ambulatory Visit
Admission: RE | Admit: 2019-08-14 | Discharge: 2019-08-14 | Disposition: A | Discharge status: Home / Self Care | Payer: Medicare HMO | Source: Ambulatory Visit | Attending: Oncology | Admitting: Oncology

## 2019-08-14 ENCOUNTER — Other Ambulatory Visit: Payer: Self-pay

## 2019-08-14 DIAGNOSIS — Z171 Estrogen receptor negative status [ER-]: Secondary | ICD-10-CM | POA: Diagnosis not present

## 2019-08-14 DIAGNOSIS — Z5111 Encounter for antineoplastic chemotherapy: Secondary | ICD-10-CM | POA: Diagnosis not present

## 2019-08-14 DIAGNOSIS — C50911 Malignant neoplasm of unspecified site of right female breast: Secondary | ICD-10-CM | POA: Diagnosis not present

## 2019-08-14 DIAGNOSIS — C50511 Malignant neoplasm of lower-outer quadrant of right female breast: Secondary | ICD-10-CM | POA: Insufficient documentation

## 2019-08-14 LAB — POCT I-STAT CREATININE: Creatinine, Ser: 0.8 mg/dL (ref 0.44–1.00)

## 2019-08-14 MED ORDER — IOHEXOL 300 MG/ML  SOLN
75.0000 mL | Freq: Once | INTRAMUSCULAR | Status: AC | PRN
  Administered 2019-08-14: 75 mL via INTRAVENOUS

## 2019-08-16 ENCOUNTER — Other Ambulatory Visit: Payer: Self-pay

## 2019-08-16 ENCOUNTER — Inpatient Hospital Stay: Payer: Medicare HMO

## 2019-08-16 ENCOUNTER — Encounter: Payer: Self-pay | Admitting: Oncology

## 2019-08-16 ENCOUNTER — Inpatient Hospital Stay: Payer: Medicare HMO | Attending: Oncology | Admitting: Oncology

## 2019-08-16 VITALS — BP 126/84 | HR 99 | Temp 98.7°F | Ht 61.0 in | Wt 124.0 lb

## 2019-08-16 DIAGNOSIS — F418 Other specified anxiety disorders: Secondary | ICD-10-CM | POA: Diagnosis not present

## 2019-08-16 DIAGNOSIS — M79602 Pain in left arm: Secondary | ICD-10-CM | POA: Insufficient documentation

## 2019-08-16 DIAGNOSIS — Z853 Personal history of malignant neoplasm of breast: Secondary | ICD-10-CM | POA: Diagnosis not present

## 2019-08-16 DIAGNOSIS — R5381 Other malaise: Secondary | ICD-10-CM | POA: Insufficient documentation

## 2019-08-16 DIAGNOSIS — Z79899 Other long term (current) drug therapy: Secondary | ICD-10-CM | POA: Diagnosis not present

## 2019-08-16 DIAGNOSIS — R918 Other nonspecific abnormal finding of lung field: Secondary | ICD-10-CM | POA: Diagnosis not present

## 2019-08-16 DIAGNOSIS — Z923 Personal history of irradiation: Secondary | ICD-10-CM | POA: Diagnosis not present

## 2019-08-16 DIAGNOSIS — Z8521 Personal history of malignant neoplasm of larynx: Secondary | ICD-10-CM | POA: Diagnosis not present

## 2019-08-16 DIAGNOSIS — Z87891 Personal history of nicotine dependence: Secondary | ICD-10-CM | POA: Insufficient documentation

## 2019-08-16 DIAGNOSIS — I700 Atherosclerosis of aorta: Secondary | ICD-10-CM | POA: Diagnosis not present

## 2019-08-16 DIAGNOSIS — C7491 Malignant neoplasm of unspecified part of right adrenal gland: Secondary | ICD-10-CM | POA: Diagnosis not present

## 2019-08-16 DIAGNOSIS — Z85828 Personal history of other malignant neoplasm of skin: Secondary | ICD-10-CM | POA: Diagnosis not present

## 2019-08-16 DIAGNOSIS — I2510 Atherosclerotic heart disease of native coronary artery without angina pectoris: Secondary | ICD-10-CM | POA: Insufficient documentation

## 2019-08-16 DIAGNOSIS — Z9221 Personal history of antineoplastic chemotherapy: Secondary | ICD-10-CM | POA: Insufficient documentation

## 2019-08-16 DIAGNOSIS — E538 Deficiency of other specified B group vitamins: Secondary | ICD-10-CM | POA: Diagnosis not present

## 2019-08-16 DIAGNOSIS — R5383 Other fatigue: Secondary | ICD-10-CM | POA: Diagnosis not present

## 2019-08-16 DIAGNOSIS — C7971 Secondary malignant neoplasm of right adrenal gland: Secondary | ICD-10-CM | POA: Insufficient documentation

## 2019-08-16 DIAGNOSIS — Z08 Encounter for follow-up examination after completed treatment for malignant neoplasm: Secondary | ICD-10-CM | POA: Diagnosis not present

## 2019-08-16 DIAGNOSIS — K219 Gastro-esophageal reflux disease without esophagitis: Secondary | ICD-10-CM | POA: Insufficient documentation

## 2019-08-16 DIAGNOSIS — Z23 Encounter for immunization: Secondary | ICD-10-CM

## 2019-08-16 DIAGNOSIS — K760 Fatty (change of) liver, not elsewhere classified: Secondary | ICD-10-CM | POA: Diagnosis not present

## 2019-08-16 MED ORDER — CYANOCOBALAMIN 1000 MCG/ML IJ SOLN
1000.0000 ug | INTRAMUSCULAR | Status: AC
  Administered 2019-08-16: 12:00:00 1000 ug via INTRAMUSCULAR
  Filled 2019-08-16: qty 1

## 2019-08-16 NOTE — Progress Notes (Signed)
Patient stated that she had been doing well with no concerns about her breast. Patient however, have been having having left arm and shoulder pain. Patient had received 3 cortisone injections and it has not helped much.

## 2019-08-19 DIAGNOSIS — C44729 Squamous cell carcinoma of skin of left lower limb, including hip: Secondary | ICD-10-CM | POA: Diagnosis not present

## 2019-08-19 DIAGNOSIS — C44622 Squamous cell carcinoma of skin of right upper limb, including shoulder: Secondary | ICD-10-CM | POA: Diagnosis not present

## 2019-08-19 DIAGNOSIS — C44612 Basal cell carcinoma of skin of right upper limb, including shoulder: Secondary | ICD-10-CM | POA: Diagnosis not present

## 2019-08-19 DIAGNOSIS — D485 Neoplasm of uncertain behavior of skin: Secondary | ICD-10-CM | POA: Diagnosis not present

## 2019-08-19 DIAGNOSIS — R208 Other disturbances of skin sensation: Secondary | ICD-10-CM | POA: Diagnosis not present

## 2019-08-20 NOTE — Progress Notes (Signed)
Hematology/Oncology Consult note [facility]  [phone] [phone]  Patient Care Team: Chrismon, Vickki Muff, PA as PCP - General (Physician Assistant) Rico Junker, RN as Oncology Nurse Navigator Sindy Guadeloupe, MD as Consulting Physician (Oncology) Bary Castilla Forest Gleason, MD (General Surgery)   Name of the patient: Denise Watts  [id number]  Apr 24, 1956   Date of visit: 08/20/19  Diagnosis- 1.H/o breast cancer in 1997 2. H/o stage I SCC of vocal cord in 2015. 3. Lung nodules4. Adrenal carcinoma of unknown primary s/p resection5. Stage IaT1a NxcM0 ER weakly positive 1-10%, PR negative and her 2 negative  Chief complaint/ Reason for visit-routine follow-up for multiple cancers and discuss CT scan results  Heme/Onc history:  Oncology History Overview Note  1. Carcinoma of breast, T1, N0, M0 tumor diagnosis.  In January of 1997 2. Carcinoma of the vocal cord T1, N0, M0 tumor.  Status postradiation therapy 3. Abnormal CT scan of the chest (December, 2015) 4. Repeat CT scan of chest shows stable nodule (July, 2016) 5. Neutropenia with normal hemoglobin and normal platelet count (July, 2016)   Cancer of vocal cord [facility])  11/10/2014 Initial Diagnosis   Cancer of vocal cord   Malignant neoplasm of lower-outer quadrant of right breast of female, estrogen receptor negative ([place])  11/24/2017 Initial Diagnosis   Malignant neoplasm of lower-outer quadrant of right breast of female, estrogen receptor negative ([place])   01/12/2018 Cancer Staging   Staging form: Breast, AJCC 8th Edition - Clinical stage from 01/12/2018: Stage IB (cT1a, cN0, cM0, G3, ER+, PR-, HER2-) - Signed by Sindy Guadeloupe, MD on 01/12/2018   01/30/2018 -  Chemotherapy   The patient had palonosetron (ALOXI) injection 0.25 mg, 0.25 mg, Intravenous,  Once, 4 of 4 cycles Administration: 0.25 mg (01/30/2018), 0.25 mg (02/27/2018), 0.25 mg (03/28/2018), 0.25 mg (04/26/2018) pegfilgrastim  (NEULASTA ONPRO KIT) injection 6 mg, 6 mg, Subcutaneous, Once, 4 of 4 cycles Administration: 6 mg (01/30/2018), 6 mg (02/27/2018), 6 mg (03/28/2018), 6 mg (04/26/2018) cyclophosphamide (CYTOXAN) 1,000 mg in sodium chloride 0.9 % 250 mL chemo infusion, 600 mg/m2 = 1,000 mg, Intravenous,  Once, 4 of 4 cycles Administration: 1,000 mg (01/30/2018), 1,000 mg (02/27/2018), 1,000 mg (03/28/2018), 1,000 mg (04/26/2018) DOCEtaxel (TAXOTERE) 130 mg in sodium chloride 0.9 % 250 mL chemo infusion, 75 mg/m2 = 130 mg, Intravenous,  Once, 4 of 4 cycles Dose modification: 60 mg/m2 (original dose 75 mg/m2, Cycle 2, Reason: Dose not tolerated) Administration: 130 mg (01/30/2018), 100 mg (02/27/2018), 100 mg (03/28/2018), 100 mg (04/26/2018)  for chemotherapy treatment.     The patient was originally diagnosed with right breast cancer in 1997,10 mm grade 3, ER pr positive her 2 negative, node negativestatus post surgery and radiation treatment only. She never received chemotherapy or hormone therapy. For her vocal cord cancer, she had extensive surgery and radiation therapy in 1998. Due to past history of smoking, she was also noted to have lung nodules. She is undergoing active surveillance imaging study of the chest.  Patient noted to have adrenal mass on CT chest which prompted PET CT in July 2018 which showed:IMPRESSION: 1. Right adrenal mass with peripheral hypermetabolism. Given interval development since 11/18/2015, suspicious for either isolated metastasis or a primary adrenal neoplasm. This should be considered for biopsy. 2. No evidence of hypermetabolic primary malignancy or extraadrenal metastasis. Pulmonary nodules are not significantly hypermetabolic. 3. Coronary artery atherosclerosis. Aortic Atherosclerosis (ICD10-I70.0). 4. Hepatic steatosis. 5. Left sixth rib hypermetabolism is favored to  be related to remote Trauma.  Patient underwent right adrenalectomy which showed:DIAGNOSIS:  A.  ADRENAL GLAND, RIGHT; ADRENALECTOMY:  - HIGH GRADE CARCINOMA WITH EXTENSIVE NECROSIS.  - MARGINS OF THE SPECIMEN ARE NEGATIVE FOR CARCINOMA.   Comment:  Sections demonstrate abundant necrosis confined to the adrenal medullary  compartment. In one block of tissue a small fragment of viable malignant  neoplasm was identified. A panel of immunohistochemical stains was  performed with the following pattern of immunoreactivity:  Super pancytokeratin: Positive  GATA-3: Negative  TTF-1: Negative  P40: Negative  Napsin: Negative  PAX-8: Negative  Chromogranin: Negative   Repeat CT chest abdomen and pelvis in July 2019 showed stable left lower lobe pulmonary nodule. No other evidence of metastatic disease. New 4.5 mm nodule in the right breast. This was followed by ultrasound mammogram and core biopsy of the breast lesion which was a grade 3 triple negative breast cancer. Patient was seen by Dr. Bary Castilla and underwent lumpectomy on 12/08/2017.  Patient had a 5 mm weakly ER positive tumor. Given her ongoing fatigue Oncotype testing was done to see if she could potentially avoid chemotherapy. Oncotype shows a recurrence score of 53 with her risk of distant recurrence at 9 years at greater than 39% with hormone therapyalone.Absolute benefit of chemotherapy was greater than 15% in her age group.Patient completed 4 cycles of adjuvant TC chemotherapy in December 2019   Interval history-patient continues to feel fatigued and still has pain in her left arm.  She has undergone multiple tests which have not shown any significant etiology that would explain the pain.  ECOG PS- 2 Pain scale- 5   Review of systems- Review of Systems  Constitutional: Positive for malaise/fatigue. Negative for chills, fever and weight loss.  HENT: Negative for congestion, ear discharge and nosebleeds.   Eyes: Negative for blurred vision.  Respiratory: Negative for cough, hemoptysis, sputum production, shortness  of breath and wheezing.   Cardiovascular: Negative for chest pain, palpitations, orthopnea and claudication.  Gastrointestinal: Negative for abdominal pain, blood in stool, constipation, diarrhea, heartburn, melena, nausea and vomiting.  Genitourinary: Negative for dysuria, flank pain, frequency, hematuria and urgency.  Musculoskeletal: Negative for back pain, joint pain and myalgias.       Left arm pain  Skin: Negative for rash.  Neurological: Negative for dizziness, tingling, focal weakness, seizures, weakness and headaches.  Endo/Heme/Allergies: Does not bruise/bleed easily.  Psychiatric/Behavioral: Negative for depression and suicidal ideas. The patient does not have insomnia.      No Known Allergies   Past Medical History:  Diagnosis Date  . Anxiety   . Arthritis    KNEE RIGHT  . Breast cancer [facility]) 1997   right breast lumpectomy with radiation  . Breast cancer ([place]) 11/15/2017   INVASIVE MAMMARY CARCINOMA/ triple negative  . 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[place]) 11/10/2014  . Cough 03/02/2017   INTERMTTENT DRY COUGH  . Depression   . GERD (gastroesophageal reflux disease)   . History of hiatal hernia   . Personal history of chemotherapy   . Personal history of radiation therapy   . Skin cancer   . Throat cancer [facility]) 1998   radiation     Past Surgical History:  Procedure Laterality Date  . BREAST BIOPSY Right 1997   positive  . BREAST BIOPSY Right 11/15/2017   INVASIVE MAMMARY CARCINOMA triple negative  . BREAST LUMPECTOMY Right 1997   2019 also  . BREAST LUMPECTOMY WITH SENTINEL LYMPH NODE BIOPSY Right 12/08/2017   Procedure: BREAST LUMPECTOMY  WITH SENTINEL LYMPH NODE BX;  Surgeon: Robert Bellow, MD;  Location: [facility];  Service: General;  Laterality: Right;  . BREAST REDUCTION WITH MASTOPEXY Left 06/10/2019   Procedure: Left breast mastopexy reduction for symmetry;  Surgeon: Wallace Going, DO;  Location: [place];  Service: Plastics;  Laterality:  Left;  total 2.5 hours  . COLONOSCOPY  2015  . ESOPHAGOGASTRODUODENOSCOPY (EGD) WITH PROPOFOL N/A 05/24/2019   Procedure: ESOPHAGOGASTRODUODENOSCOPY (EGD) WITH PROPOFOL;  Surgeon: Lucilla Lame, MD;  Location: [facility] ENDOSCOPY;  Service: Endoscopy;  Laterality: N/A;  . ESOPHAGUS SURGERY    . KNEE SURGERY Right   . LIPOSUCTION WITH LIPOFILLING Right 06/10/2019   Procedure: right breast scar and capsule release with fat grafting;  Surgeon: Wallace Going, DO;  Location: [place];  Service: Plastics;  Laterality: Right;  . PORT-A-CATH REMOVAL    . PORTACATH PLACEMENT Right 01/17/2018   Procedure: INSERTION PORT-A-CATH- RIGHT;  Surgeon: Robert Bellow, MD;  Location: [facility];  Service: General;  Laterality: Right;  . ROBOTIC ADRENALECTOMY Right 12/28/2016   Procedure: ROBOTIC ADRENALECTOMY;  Surgeon: Hollice Espy, MD;  Location: [facility];  Service: Urology;  Laterality: Right;  . SKIN CANCER EXCISION    . THROAT SURGERY  1998   throat cancer   . TUBAL LIGATION    . VENTRAL HERNIA REPAIR N/A 03/03/2017   Procedure: HERNIA REPAIR VENTRAL ADULT;  Surgeon: Clayburn Pert, MD;  Location: [facility] OR[facility];  Service: General;  Laterality: N/A;    Social History   Socioeconomic History  . Marital status: Married    Spouse name: Not on file  . Number of children: Not on file  . Years of education: Not on file  . Highest education level: Not on file  Occupational History  . Not on file  Tobacco Use  . Smoking status: Former Smoker    Packs/day: 1.50    Years: 30.00    Pack years: 45.00    Types: Cigarettes    Quit date: 05/02/1996    Years since quitting: 23.3  . Smokeless tobacco: Never Used  Substance and Sexual Activity  . Alcohol use: Yes    Alcohol/week: 0.0 standard drinks    Comment: occ.  . Drug use: No  . Sexual activity: Not on file  Other Topics Concern  . Not on file  Social History Narrative  . Not on file   Social Determinants of Health   Financial  Resource Strain:   . Difficulty of Paying Living Expenses:   Food Insecurity:   . Worried About Charity fundraiser in the Last Year:   . Arboriculturist in the Last Year:   Transportation Needs:   . Film/video editor (Medical):   Marland Kitchen Lack of Transportation (Non-Medical):   Physical Activity:   . Days of Exercise per Week:   . Minutes of Exercise per Session:   Stress:   . Feeling of Stress :   Social Connections:   . Frequency of Communication with Friends and Family:   . Frequency of Social Gatherings with Friends and Family:   . Attends Religious Services:   . Active Member of Clubs or Organizations:   . Attends Archivist Meetings:   Marland Kitchen Marital Status:   Intimate Partner Violence:   . Fear of Current or Ex-Partner:   . Emotionally Abused:   Marland Kitchen Physically Abused:   . Sexually Abused:     Family History  Problem Relation Age  of Onset  . Diabetes Mother   . Heart attack Mother   . Cancer Sister        cancer of eyes, spread to lung, other places. died at 66  . Cancer Sister 41       unknown primary, spread to bone, brain died in 92's  . Cancer Paternal Aunt        unk type  . Cancer Paternal Grandmother        type unk  . Prostate cancer Neg Hx   . Kidney cancer Neg Hx   . Bladder Cancer Neg Hx      Current Outpatient Medications:  .  Cyanocobalamin (B-12 COMPLIANCE INJECTION IJ), Inject 1 Dose as directed every 30 (thirty) days., Disp: , Rfl:  .  DULoxetine (CYMBALTA) 60 MG capsule, Take 1 capsule by mouth daily., Disp: , Rfl:  .  ergocalciferol (VITAMIN D2) 1.25 MG (50000 UT) capsule, Take 1 capsule by mouth once a week., Disp: , Rfl:  .  omeprazole (PRILOSEC) 40 MG capsule, TAKE 1 CAPSULE BY MOUTH EVERY DAY (MORNING), Disp: 30 capsule, Rfl: 11 .  ondansetron (ZOFRAN) 4 MG tablet, Take 1 tablet (4 mg total) by mouth every 8 (eight) hours as needed for nausea or vomiting. (Patient not taking: Reported on 08/16/2019), Disp: 20 tablet, Rfl: 0 No current  facility-administered medications for this visit.  Facility-Administered Medications Ordered in Other Visits:  .  cyanocobalamin ((VITAMIN B-12)) injection 1,000 mcg, 1,000 mcg, Intramuscular, Q30 days, Sindy Guadeloupe, MD, 1,000 mcg at 08/03/18 0936 .  cyanocobalamin ((VITAMIN B-12)) injection 1,000 mcg, 1,000 mcg, Intramuscular, Q30 days, Sindy Guadeloupe, MD, 1,000 mcg at 09/28/18 1013 .  cyanocobalamin ((VITAMIN B-12)) injection 1,000 mcg, 1,000 mcg, Intramuscular, Q30 days, Sindy Guadeloupe, MD, 1,000 mcg at 11/30/18 1355 .  cyanocobalamin ((VITAMIN B-12)) injection 1,000 mcg, 1,000 mcg, Intramuscular, Q30 days, Sindy Guadeloupe, MD, 1,000 mcg at 08/16/19 1144  Physical exam:  Vitals:   08/16/19 1059  BP: 126/84  Pulse: 99  Temp: 98.7 F (37.1 C)  TempSrc: Tympanic  Weight: 124 lb (56.2 kg)  Height: _0  (1.549 m)   Physical Exam Constitutional:      Comments: Appears fatigued  HENT:     Head: Normocephalic and atraumatic.  Eyes:     Pupils: Pupils are equal, round, and reactive to light.  Cardiovascular:     Rate and Rhythm: Normal rate and regular rhythm.     Heart sounds: Normal heart sounds.  Pulmonary:     Effort: Pulmonary effort is normal.     Breath sounds: Normal breath sounds.  Abdominal:     General: Bowel sounds are normal.     Palpations: Abdomen is soft.  Musculoskeletal:     Cervical back: Normal range of motion.  Skin:    General: Skin is warm and dry.  Neurological:     Mental Status: She is alert and oriented to person, place, and time.   Patient is s/p left lumpectomy with a well-healed surgical scar.  No palpable left breast masses.  No nipple changes.  No palpable left axillary adenopathy.  Patient is s/p right mastopexy.  No palpable masses in the right breast.  No palpable right axillary adenopathy.  CMP Latest Ref Rng & Units 08/14/2019  Glucose 70 - 99 mg/dL -  BUN 8 - 23 mg/dL -  Creatinine 0.44 - 1.00 mg/dL 0.80  Sodium 135 - 145 mmol/L -    Potassium 3.5 - 5.1 mmol/L -  Chloride 98 - 111 mmol/L -  CO2 22 - 32 mmol/L -  Calcium 8.9 - 10.3 mg/dL -  Total Protein 6.5 - 8.1 g/dL -  Total Bilirubin 0.3 - 1.2 mg/dL -  Alkaline Phos 38 - 126 U/L -  AST 15 - 41 U/L -  ALT 0 - 44 U/L -   CBC Latest Ref Rng & Units 05/13/2019  WBC 4.0 - 10.5 K/uL 5.9  Hemoglobin 12.0 - 15.0 g/dL 13.9  Hematocrit 36.0 - 46.0 % 44.0  Platelets 150 - 400 K/uL 209    No images are attached to the encounter.  CT Chest W Contrast  Result Date: 08/14/2019 CLINICAL DATA:  Restaging right breast cancer, prior chemotherapy in 2019. History of vocal cord cancer, prior radiation therapy. EXAM: CT CHEST, ABDOMEN, AND PELVIS WITH CONTRAST TECHNIQUE: Multidetector CT imaging of the chest, abdomen and pelvis was performed following the standard protocol during bolus administration of intravenous contrast. CONTRAST:  2m OMNIPAQUE IOHEXOL 300 MG/ML  SOLN COMPARISON:  11/08/2018 FINDINGS: CT CHEST FINDINGS Cardiovascular: Atherosclerotic calcification of the thoracic aorta, branch vessels, right coronary artery. Mediastinum/Nodes: Contrast medium in the esophagus suggesting dysmotility or reflux. No pathologic adenopathy identified. Lungs/Pleura: Centrilobular emphysema. Stable mild scarring laterally in the right upper lobe on image 63/3. Stable scarring anteriorly in the right upper lobe. New 3 mm right middle lobe nodule on image 98/3. Additional subtle nodularity in this vicinity. Faint nodularity posteriorly in the right lower lobe appears new and includes a 3 mm right lower lobe nodule on image 94/3. Some mild ground-glass opacities posteriorly in the right lower lobe including a superior segment nodule measuring 1.2 by 0.9 cm on image 63/3, formerly the same by my measurements. 4 mm apicoposterior segment left upper lobe nodule on image 26/3, stable. Ground-glass density subpleural nodule the left upper lobe measures 1.1 by 1.2 cm on image 68/3, previously less well  delineated but of similar size on the prior exam. A solid-appearing subpleural nodule in the superior segment left lower lobe measures 1.2 by 1.0 cm on image 66/3, formerly 1.3 by 1.0 cm by my measurements. Musculoskeletal: Prior right mastectomy and breast reconstruction. CT ABDOMEN PELVIS FINDINGS Hepatobiliary: Unremarkable Pancreas: Unremarkable Spleen: Unremarkable Adrenals/Urinary Tract: Stable appearance of soft tissue along the right adrenalectomy site. The left adrenal gland and kidneys appear normal aside from tiny hypodense renal lesions which are likely cysts but technically too small to characterize. Stomach/Bowel: There is a wrap appearance along the gastroesophageal junction, query prior fundoplication. Prominent stool throughout the colon favors constipation. Upper normal sized loops of small bowel without overt bowel dilatation. Vascular/Lymphatic: Aortoiliac atherosclerotic vascular disease. No pathologic adenopathy identified. Reproductive: Unremarkable Other: No supplemental non-categorized findings. Musculoskeletal: Unremarkable IMPRESSION: 1. No specific findings of recurrent malignancy. 2. Most of the nodules including the ground-glass pulmonary nodules are stable. There are some tiny nodules in the 2-4 mm range in the right lower lobe which are likely inflammatory or postinflammatory based on distribution, which are new compared to the prior exam. Given patient's history of smoking, surveillance is likely warranted both for these older nodules and for some of the ground-glass density nodules. The dominant solid solid nodule in the superior segment left lower lobe is stable. 3. Other imaging findings of potential clinical significance: Coronary atherosclerosis. Contrast medium in the esophagus suggesting dysmotility or reflux. Stable appearance of soft tissue along the right adrenalectomy site. Prominent stool throughout the colon favors constipation. Upper normal sized loops of small bowel  without overt bowel  dilatation. 4. Emphysema and aortic atherosclerosis. Aortic Atherosclerosis (ICD10-I70.0) and Emphysema (ICD10-J43.9). Electronically Signed   By: Van Clines M.D.   On: 08/14/2019 15:32   MR BRAIN W WO CONTRAST  Result Date: 07/23/2019 CLINICAL DATA:  Dizziness.  History of multiple prior cancers. EXAM: MRI HEAD WITHOUT AND WITH CONTRAST TECHNIQUE: Multiplanar, multiecho pulse sequences of the brain and surrounding structures were obtained without and with intravenous contrast. CONTRAST:  108m GADAVIST GADOBUTROL 1 MMOL/ML IV SOLN COMPARISON:  Brain MRI report 06/21/2001 FINDINGS: Brain: No acute infarction, hemorrhage, hydrocephalus, extra-axial collection or mass lesion. No generalized white matter disease. There are a few remote white matter insults which are essentially age congruent. No abnormal enhancement. Vascular: Normal flow voids and vascular enhancements Skull and upper cervical spine: Normal marrow signal. Sinuses/Orbits: Negative IMPRESSION: Unremarkable brain MRI.  No explanation for symptoms. Electronically Signed   By: JMonte FantasiaM.D.   On: 07/23/2019 10:06   MR CHEST W WO CONTRAST  Result Date: 07/23/2019 CLINICAL DATA:  Left-sided neck and shoulder pain. EXAM: MRI BRACHIAL PLEXUS WITHOUT AND WITH CONTRAST TECHNIQUE: Multiplanar, multiecho pulse sequences of the neck and surrounding structures were obtained without and with intravenous contrast. The field of view was focused on the left brachial plexus from the neural foramina to the axilla. CONTRAST:  589mGADAVIST GADOBUTROL 1 MMOL/ML IV SOLN COMPARISON:  Cervical spine MRI 03/26/2019 FINDINGS: Spinal cord Normal MR appearance of the cervical spinal cord. No cord lesions or syrinx. Brachial plexus: Normal appearance of the brachial plexus structures bilaterally. The neurovascular bundles are surrounded by fat along their entire course. No mass or mass effect is identified. Muscles and tendons The major  muscles and tendons are intact. 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 I do not see any hypertrophy or inflammatory changes or direct mass effect on the brachial plexus structures. This can be associated with thoracic outlet syndrome and suprascapular nerve impingement but I do not see any definite findings for such. Bones No significant bony findings. No bone lesions or spurring. Joints No significant abnormality involving the left shoulder is identified. Other findings Degenerative disc disease is noted at C5-6 with bilateral disc osteophyte complexes contributing to bilateral foraminal stenosis. This appears stable when compared to the MRI of the cervical spine from 03/26/2019. IMPRESSION: 1. Unremarkable MR examination of the brachial plexus structures. 2. 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 I do not see any definite mass effect or impingement on the brachial plexus structures. 3. Degenerative disc disease at C5-6 with bilateral foraminal stenosis, stable when compared to the MRI of the cervical spine from 03/26/2019. Electronically Signed   By: P.Marijo Sanes.D.   On: 07/23/2019 11:12   CT ABDOMEN PELVIS W CONTRAST  Result Date: 08/14/2019 CLINICAL DATA:  Restaging right breast cancer, prior chemotherapy in 2019. History of vocal cord cancer, prior radiation therapy. EXAM: CT CHEST, ABDOMEN, AND PELVIS WITH CONTRAST TECHNIQUE: Multidetector CT imaging of the chest, abdomen and pelvis was performed following the standard protocol during bolus administration of intravenous contrast. CONTRAST:  7521mMNIPAQUE IOHEXOL 300 MG/ML  SOLN COMPARISON:  11/08/2018 FINDINGS: CT CHEST FINDINGS Cardiovascular: Atherosclerotic calcification of the thoracic aorta, branch vessels, right coronary artery. Mediastinum/Nodes: Contrast medium in the esophagus suggesting dysmotility or reflux. No pathologic adenopathy identified. Lungs/Pleura: Centrilobular emphysema. Stable mild scarring  laterally in the right upper lobe on image 63/3. Stable scarring anteriorly in the right upper lobe. New 3 mm right middle lobe nodule on image 98/3.  Additional subtle nodularity in this vicinity. Faint nodularity posteriorly in the right lower lobe appears new and includes a 3 mm right lower lobe nodule on image 94/3. Some mild ground-glass opacities posteriorly in the right lower lobe including a superior segment nodule measuring 1.2 by 0.9 cm on image 63/3, formerly the same by my measurements. 4 mm apicoposterior segment left upper lobe nodule on image 26/3, stable. Ground-glass density subpleural nodule the left upper lobe measures 1.1 by 1.2 cm on image 68/3, previously less well delineated but of similar size on the prior exam. A solid-appearing subpleural nodule in the superior segment left lower lobe measures 1.2 by 1.0 cm on image 66/3, formerly 1.3 by 1.0 cm by my measurements. Musculoskeletal: Prior right mastectomy and breast reconstruction. CT ABDOMEN PELVIS FINDINGS Hepatobiliary: Unremarkable Pancreas: Unremarkable Spleen: Unremarkable Adrenals/Urinary Tract: Stable appearance of soft tissue along the right adrenalectomy site. The left adrenal gland and kidneys appear normal aside from tiny hypodense renal lesions which are likely cysts but technically too small to characterize. Stomach/Bowel: There is a wrap appearance along the gastroesophageal junction, query prior fundoplication. Prominent stool throughout the colon favors constipation. Upper normal sized loops of small bowel without overt bowel dilatation. Vascular/Lymphatic: Aortoiliac atherosclerotic vascular disease. No pathologic adenopathy identified. Reproductive: Unremarkable Other: No supplemental non-categorized findings. Musculoskeletal: Unremarkable IMPRESSION: 1. No specific findings of recurrent malignancy. 2. Most of the nodules including the ground-glass pulmonary nodules are stable. There are some tiny nodules in the 2-4 mm range  in the right lower lobe which are likely inflammatory or postinflammatory based on distribution, which are new compared to the prior exam. Given patient's history of smoking, surveillance is likely warranted both for these older nodules and for some of the ground-glass density nodules. The dominant solid solid nodule in the superior segment left lower lobe is stable. 3. Other imaging findings of potential clinical significance: Coronary atherosclerosis. Contrast medium in the esophagus suggesting dysmotility or reflux. Stable appearance of soft tissue along the right adrenalectomy site. Prominent stool throughout the colon favors constipation. Upper normal sized loops of small bowel without overt bowel dilatation. 4. Emphysema and aortic atherosclerosis. Aortic Atherosclerosis (ICD10-I70.0) and [place] (ICD10-J43.9). Electronically Signed   By: Van Clines M.D.   On: 08/14/2019 15:32     Assessment and plan- Patient is a 64 y.o. 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 Final path showed 5 mm grade 3 Er weakly positive 1-10%, pr negative and her 2 negative tumor.Oncotype came back as high risk.She completed 4 cycles of adjuvant TC chemotherapy in December 2019 .  She is here to discuss CT scan results and follow-up of following issues:  1.  Right breast weakly ER positive breast cancer: Patient continues to have significant fatigue and joint pain as well as left arm pain.  She has not been on any hormone therapy despite of weekly ER positive breast cancer which would be reasonable keeping in mind her quality of life versus possible benefit.  Her yearly mammograms will be coordinated by Dr. Bary Castilla  2. B/l lung nodules: CT chest abdomen  and pelvis with contrast does not show any findings of recurrent malignancy.  Her bilateral lung nodules remain stable.  I will consider getting a repeat scan in  1 year given her stability of scans over the last 2 years.  3.  Left arm pain: Patient has undergone MRI of her shoulder humerus as well as cervical spineWhich did not reveal any evidence of acute pathology or metastatic disease.  Etiology of this pain remains unclear.  She sees orthopedics for this  4.  B12 deficiency.  Patient has history of longstanding B12 deficiency and will continue monthly B12 shots  I will see her back in 6 months with CBC with differential and CMP     Visit Diagnosis 1. Lung nodules   2. Encounter for follow-up surveillance of breast cancer   3. Carcinoma of right adrenal gland ([place])   4. B12 deficiency      Dr. Randa Evens, MD, MPH [facility] at [facility] [phone] 08/20/2019 8:18 AM

## 2019-09-02 DIAGNOSIS — D0461 Carcinoma in situ of skin of right upper limb, including shoulder: Secondary | ICD-10-CM | POA: Diagnosis not present

## 2019-09-02 DIAGNOSIS — L570 Actinic keratosis: Secondary | ICD-10-CM | POA: Diagnosis not present

## 2019-09-11 DIAGNOSIS — C44729 Squamous cell carcinoma of skin of left lower limb, including hip: Secondary | ICD-10-CM | POA: Diagnosis not present

## 2019-09-12 DIAGNOSIS — R569 Unspecified convulsions: Secondary | ICD-10-CM | POA: Diagnosis not present

## 2019-09-13 ENCOUNTER — Inpatient Hospital Stay: Payer: Medicare HMO | Attending: Oncology

## 2019-09-20 ENCOUNTER — Inpatient Hospital Stay: Payer: Medicare HMO

## 2019-10-01 DIAGNOSIS — G20 Parkinson's disease: Secondary | ICD-10-CM | POA: Diagnosis not present

## 2019-10-01 DIAGNOSIS — G545 Neuralgic amyotrophy: Secondary | ICD-10-CM | POA: Diagnosis not present

## 2019-10-01 DIAGNOSIS — M47812 Spondylosis without myelopathy or radiculopathy, cervical region: Secondary | ICD-10-CM | POA: Diagnosis not present

## 2019-10-01 DIAGNOSIS — M7582 Other shoulder lesions, left shoulder: Secondary | ICD-10-CM | POA: Diagnosis not present

## 2019-10-01 DIAGNOSIS — R58 Hemorrhage, not elsewhere classified: Secondary | ICD-10-CM | POA: Diagnosis not present

## 2019-10-01 DIAGNOSIS — R233 Spontaneous ecchymoses: Secondary | ICD-10-CM | POA: Diagnosis not present

## 2019-10-02 NOTE — Progress Notes (Signed)
Acute Office Visit  Subjective:    Patient ID: Denise Watts, female    DOB: 1955/09/02, 64 y.o.   MRN: [id number]  Chief Complaint  Patient presents with  . Urinary Tract Infection    HPI Patient is in today for evaluation of possible UTI.  She complains of dysuria, frequency, urgency and decreased urinary flow for about 1 week.  She denies seeing any blood or having any fever.    Past Medical History:  Diagnosis Date  . Anxiety   . Arthritis    KNEE RIGHT  . Breast cancer [facility]) 1997   right breast lumpectomy with radiation  . Breast cancer ([place]) 11/15/2017   INVASIVE MAMMARY CARCINOMA/ triple negative  . 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[place]) 11/10/2014  . Cough 03/02/2017   INTERMTTENT DRY COUGH  . Depression   . GERD (gastroesophageal reflux disease)   . History of hiatal hernia   . Personal history of chemotherapy   . Personal history of radiation therapy   . Skin cancer   . Throat cancer [facility]) 1998   radiation    Past Surgical History:  Procedure Laterality Date  . BREAST BIOPSY Right 1997   positive  . BREAST BIOPSY Right 11/15/2017   INVASIVE MAMMARY CARCINOMA triple negative  . BREAST LUMPECTOMY Right 1997   2019 also  . BREAST LUMPECTOMY WITH SENTINEL LYMPH NODE BIOPSY Right 12/08/2017   Procedure: BREAST LUMPECTOMY WITH SENTINEL LYMPH NODE BX;  Surgeon: Robert Bellow, MD;  Location: [facility] ORS;  Service: General;  Laterality: Right;  . BREAST REDUCTION WITH MASTOPEXY Left 06/10/2019   Procedure: Left breast mastopexy reduction for symmetry;  Surgeon: Wallace Going, DO;  Location: [place];  Service: Plastics;  Laterality: Left;  total 2.5 hours  . COLONOSCOPY  2015  . ESOPHAGOGASTRODUODENOSCOPY (EGD) WITH PROPOFOL N/A 05/24/2019   Procedure: ESOPHAGOGASTRODUODENOSCOPY (EGD) WITH PROPOFOL;  Surgeon: Lucilla Lame, MD;  Location: [facility] ENDOSCOPY;  Service: Endoscopy;  Laterality: N/A;  . ESOPHAGUS SURGERY    . KNEE SURGERY Right   . LIPOSUCTION  WITH LIPOFILLING Right 06/10/2019   Procedure: right breast scar and capsule release with fat grafting;  Surgeon: Wallace Going, DO;  Location: [place];  Service: Plastics;  Laterality: Right;  . PORT-A-CATH REMOVAL    . PORTACATH PLACEMENT Right 01/17/2018   Procedure: INSERTION PORT-A-CATH- RIGHT;  Surgeon: Robert Bellow, MD;  Location: [facility] OR[facility];  Service: General;  Laterality: Right;  . ROBOTIC ADRENALECTOMY Right 12/28/2016   Procedure: ROBOTIC ADRENALECTOMY;  Surgeon: Hollice Espy, MD;  Location: [facility] OR[facility];  Service: Urology;  Laterality: Right;  . SKIN CANCER EXCISION    . THROAT SURGERY  1998   throat cancer   . TUBAL LIGATION    . VENTRAL HERNIA REPAIR N/A 03/03/2017   Procedure: HERNIA REPAIR VENTRAL ADULT;  Surgeon: Clayburn Pert, MD;  Location: [facility] ORS;  Service: General;  Laterality: N/A;    Family History  Problem Relation Age of Onset  . Diabetes Mother   . Heart attack Mother   . Cancer Sister        cancer of eyes, spread to lung, other places. died at 54  . Cancer Sister 51       unknown primary, spread to bone, brain died in 26's  . Cancer Paternal Aunt        unk type  . Cancer Paternal Grandmother        type unk  . Prostate cancer Neg  Hx   . Kidney cancer Neg Hx   . Bladder Cancer Neg Hx     Social History   Socioeconomic History  . Marital status: Married    Spouse name: Not on file  . Number of children: Not on file  . Years of education: Not on file  . Highest education level: Not on file  Occupational History  . Not on file  Tobacco Use  . Smoking status: Former Smoker    Packs/day: 1.50    Years: 30.00    Pack years: 45.00    Types: Cigarettes    Quit date: 05/02/1996    Years since quitting: 23.4  . Smokeless tobacco: Never Used  Substance and Sexual Activity  . Alcohol use: Yes    Alcohol/week: 0.0 standard drinks    Comment: occ.  . Drug use: No  . Sexual activity: Not on file  Other Topics Concern  .  Not on file  Social History Narrative  . Not on file   Social Determinants of Health   Financial Resource Strain:   . Difficulty of Paying Living Expenses:   Food Insecurity:   . Worried About Charity fundraiser in the Last Year:   . Arboriculturist in the Last Year:   Transportation Needs:   . Film/video editor (Medical):   Marland Kitchen Lack of Transportation (Non-Medical):   Physical Activity:   . Days of Exercise per Week:   . Minutes of Exercise per Session:   Stress:   . Feeling of Stress :   Social Connections:   . Frequency of Communication with Friends and Family:   . Frequency of Social Gatherings with Friends and Family:   . Attends Religious Services:   . Active Member of Clubs or Organizations:   . Attends Archivist Meetings:   Marland Kitchen Marital Status:   Intimate Partner Violence:   . Fear of Current or Ex-Partner:   . Emotionally Abused:   Marland Kitchen Physically Abused:   . Sexually Abused:     Outpatient Medications Prior to Visit  Medication Sig Dispense Refill  . Cyanocobalamin (B-12 COMPLIANCE INJECTION IJ) Inject 1 Dose as directed every 30 (thirty) days.    . DULoxetine (CYMBALTA) 60 MG capsule Take 1 capsule by mouth daily.    Marland Kitchen omeprazole (PRILOSEC) 40 MG capsule TAKE 1 CAPSULE BY MOUTH EVERY DAY (MORNING) 30 capsule 11  . ergocalciferol (VITAMIN D2) 1.25 MG (50000 UT) capsule Take 1 capsule by mouth once a week.    . ondansetron (ZOFRAN) 4 MG tablet Take 1 tablet (4 mg total) by mouth every 8 (eight) hours as needed for nausea or vomiting. (Patient not taking: Reported on 08/16/2019) 20 tablet 0   Facility-Administered Medications Prior to Visit  Medication Dose Route Frequency Provider Last Rate Last Admin  . cyanocobalamin ((VITAMIN B-12)) injection 1,000 mcg  1,000 mcg Intramuscular Q30 days Sindy Guadeloupe, MD   1,000 mcg at 08/03/18 0936  . cyanocobalamin ((VITAMIN B-12)) injection 1,000 mcg  1,000 mcg Intramuscular Q30 days Sindy Guadeloupe, MD   1,000 mcg at  09/28/18 1013  . cyanocobalamin ((VITAMIN B-12)) injection 1,000 mcg  1,000 mcg Intramuscular Q30 days Sindy Guadeloupe, MD   1,000 mcg at 11/30/18 1355  . cyanocobalamin ((VITAMIN B-12)) injection 1,000 mcg  1,000 mcg Intramuscular Q30 days Sindy Guadeloupe, MD   1,000 mcg at 08/16/19 1144    No Known Allergies  Review of Systems  Genitourinary: Positive for decreased  urine volume, dysuria, frequency and urgency. Negative for difficulty urinating, enuresis, flank pain and hematuria.      Objective:    Physical Exam  BP 122/80 (BP Location: Left Arm, Patient Position: Sitting, Cuff Size: Normal)   Pulse 82   Temp (!) 96.8 F (36 C) (Skin)   Wt 128 lb (58.1 kg)   SpO2 96%   BMI 24.19 kg/m  Wt Readings from Last 3 Encounters:  10/03/19 128 lb (58.1 kg)  08/16/19 124 lb (56.2 kg)  07/16/19 125 lb 6.4 oz (56.9 kg)    Health Maintenance Due  Topic Date Due  . Hepatitis C Screening  Never done  . COVID-19 Vaccine (1) Never done  . TETANUS/TDAP  Never done  . DEXA SCAN  Never done  . PNA vac Low Risk Adult (1 of 2 - PCV13) Never done  . COLONOSCOPY  04/08/2019    There are no preventive care reminders to display for this patient.   Lab Results  Component Value Date   TSH 0.414 10/31/2018   Lab Results  Component Value Date   WBC 5.9 05/13/2019   HGB 13.9 05/13/2019   HCT 44.0 05/13/2019   MCV 104.0 (H) 05/13/2019   PLT 209 05/13/2019   Lab Results  Component Value Date   NA 139 05/13/2019   K 4.3 05/13/2019   CO2 28 05/13/2019   GLUCOSE 88 05/13/2019   BUN 8 05/13/2019   CREATININE 0.80 08/14/2019   BILITOT 0.7 05/13/2019   ALKPHOS 60 05/13/2019   AST 18 05/13/2019   ALT 12 05/13/2019   PROT 6.7 05/13/2019   ALBUMIN 4.0 05/13/2019   CALCIUM 9.2 05/13/2019   ANIONGAP 9 05/13/2019   No results found for: CHOL No results found for: HDL No results found for: LDLCALC No results found for: TRIG No results found for: CHOLHDL No results found for: HGBA1C       Assessment & Plan:   1. Dysuria Onset the past week without fever or hematuria. Microscopic exam today showed pyuria with bacteriuria. Recommend increase in fluid intake and start antibiotic with Pyridium for discomfort. Will get urine C&S and follow up pending reports. - Urine Culture - POCT urinalysis dipstick - sulfamethoxazole-trimethoprim (BACTRIM DS) 800-160 MG tablet; Take 1 tablet by mouth 2 (two) times daily.  Dispense: 14 tablet; Refill: 0 - phenazopyridine (PYRIDIUM) 200 MG tablet; Take 1 tablet (200 mg total) by mouth 3 (three) times daily as needed for pain.  Dispense: 12 tablet; Refill: 0  2. History of right breast cancer H/o breast cancer in 1997.  H/o stage I SCC of vocal cord in 2015. Adrenal carcinoma of unknown primary s/p resection.  3. History of throat cancer Carcinoma of the vocal cord T1, N0, M0 tumor.  Status postradiation therapy.  4. Colon cancer screening Oncologist (Dr. Janese Banks) recommended recheck of colonoscopy (last one was in 2015) with history of breast cancer, pulmonary nodules, right adrenal mass and many SCC of skin. Some intermittent constipation but no recent melena. Lower abdominal pains/spasms due to UTI.  No orders of the defined types were placed in this encounter.    Juluis Mire, CMA

## 2019-10-03 ENCOUNTER — Ambulatory Visit (INDEPENDENT_AMBULATORY_CARE_PROVIDER_SITE_OTHER): Payer: Medicare HMO | Admitting: Family Medicine

## 2019-10-03 ENCOUNTER — Encounter: Payer: Self-pay | Admitting: Family Medicine

## 2019-10-03 ENCOUNTER — Other Ambulatory Visit: Payer: Self-pay

## 2019-10-03 VITALS — BP 122/80 | HR 82 | Temp 96.8°F | Wt 128.0 lb

## 2019-10-03 DIAGNOSIS — R300 Dysuria: Secondary | ICD-10-CM

## 2019-10-03 DIAGNOSIS — Z85819 Personal history of malignant neoplasm of unspecified site of lip, oral cavity, and pharynx: Secondary | ICD-10-CM

## 2019-10-03 DIAGNOSIS — Z1211 Encounter for screening for malignant neoplasm of colon: Secondary | ICD-10-CM | POA: Diagnosis not present

## 2019-10-03 DIAGNOSIS — Z853 Personal history of malignant neoplasm of breast: Secondary | ICD-10-CM

## 2019-10-03 LAB — POCT URINALYSIS DIPSTICK
Bilirubin, UA: NEGATIVE
Blood, UA: NEGATIVE
Glucose, UA: NEGATIVE
Ketones, UA: NEGATIVE
Nitrite, UA: NEGATIVE
Protein, UA: NEGATIVE
Spec Grav, UA: 1.025 (ref 1.010–1.025)
Urobilinogen, UA: 0.2 E.U./dL
pH, UA: 6 (ref 5.0–8.0)

## 2019-10-03 MED ORDER — PHENAZOPYRIDINE HCL 200 MG PO TABS: 200.0000 mg | ORAL_TABLET | Freq: Three times a day (TID) | ORAL | 0 refills | Status: DC | PRN

## 2019-10-03 MED ORDER — SULFAMETHOXAZOLE-TRIMETHOPRIM 800-160 MG PO TABS: 1.0000 | ORAL_TABLET | Freq: Two times a day (BID) | ORAL | 0 refills | Status: DC

## 2019-10-03 NOTE — Patient Instructions (Signed)
Urinary Tract Infection, Adult A urinary tract infection (UTI) is an infection of any part of the urinary tract. The urinary tract includes:  The kidneys.  The ureters.  The bladder.  The urethra. These organs make, store, and get rid of pee (urine) in the body. What are the causes? This is caused by germs (bacteria) in your genital area. These germs grow and cause swelling (inflammation) of your urinary tract. What increases the risk? You are more likely to develop this condition if:  You have a small, thin tube (catheter) to drain pee.  You cannot control when you pee or poop (incontinence).  You are female, and: ? You use these methods to prevent pregnancy:  A medicine that kills sperm (spermicide).  A device that blocks sperm (diaphragm). ? You have low levels of a female hormone (estrogen). ? You are pregnant.  You have genes that add to your risk.  You are sexually active.  You take antibiotic medicines.  You have trouble peeing because of: ? A prostate that is bigger than normal, if you are female. ? A blockage in the part of your body that drains pee from the bladder (urethra). ? A kidney stone. ? A nerve condition that affects your bladder (neurogenic bladder). ? Not getting enough to drink. ? Not peeing often enough.  You have other conditions, such as: ? Diabetes. ? A weak disease-fighting system (immune system). ? Sickle cell disease. ? Gout. ? Injury of the spine. What are the signs or symptoms? Symptoms of this condition include:  Needing to pee right away (urgently).  Peeing often.  Peeing small amounts often.  Pain or burning when peeing.  Blood in the pee.  Pee that smells bad or not like normal.  Trouble peeing.  Pee that is cloudy.  Fluid coming from the vagina, if you are female.  Pain in the belly or lower back. Other symptoms include:  Throwing up (vomiting).  No urge to eat.  Feeling mixed up (confused).  Being tired  and grouchy (irritable).  A fever.  Watery poop (diarrhea). How is this treated? This condition may be treated with:  Antibiotic medicine.  Other medicines.  Drinking enough water. Follow these instructions at home:  Medicines  Take over-the-counter and prescription medicines only as told by your doctor.  If you were prescribed an antibiotic medicine, take it as told by your doctor. Do not stop taking it even if you start to feel better. General instructions  Make sure you: ? Pee until your bladder is empty. ? Do not hold pee for a long time. ? Empty your bladder after sex. ? Wipe from front to back after pooping if you are a female. Use each tissue one time when you wipe.  Drink enough fluid to keep your pee pale yellow.  Keep all follow-up visits as told by your doctor. This is important. Contact a doctor if:  You do not get better after 1-2 days.  Your symptoms go away and then come back. Get help right away if:  You have very bad back pain.  You have very bad pain in your lower belly.  You have a fever.  You are sick to your stomach (nauseous).  You are throwing up. Summary  A urinary tract infection (UTI) is an infection of any part of the urinary tract.  This condition is caused by germs in your genital area.  There are many risk factors for a UTI. These include having a small, thin   tube to drain pee and not being able to control when you pee or poop.  Treatment includes antibiotic medicines for germs.  Drink enough fluid to keep your pee pale yellow. This information is not intended to replace advice given to you by your health care provider. Make sure you discuss any questions you have with your health care provider. Document Revised: 04/05/2018 Document Reviewed: 10/26/2017 Elsevier Patient Education  2020 Elsevier Inc.  

## 2019-10-05 LAB — URINE CULTURE

## 2019-10-07 ENCOUNTER — Other Ambulatory Visit: Payer: Self-pay

## 2019-10-07 MED ORDER — NITROFURANTOIN MONOHYD MACRO 100 MG PO CAPS: 100.0000 mg | ORAL_CAPSULE | Freq: Two times a day (BID) | ORAL | 0 refills | Status: DC

## 2019-10-08 ENCOUNTER — Telehealth (INDEPENDENT_AMBULATORY_CARE_PROVIDER_SITE_OTHER): Payer: Self-pay | Admitting: Gastroenterology

## 2019-10-08 ENCOUNTER — Other Ambulatory Visit: Payer: Self-pay

## 2019-10-08 DIAGNOSIS — Z1211 Encounter for screening for malignant neoplasm of colon: Secondary | ICD-10-CM

## 2019-10-08 MED ORDER — PEG 3350-KCL-NA BICARB-NACL 420 G PO SOLR: 4000.0000 mL | Freq: Once | ORAL | 0 refills | Status: AC

## 2019-10-08 NOTE — Progress Notes (Signed)
Gastroenterology Pre-Procedure Review  Request Date: Tuesday 10/29/19 Requesting Physician: Dr. Allen Norris  PATIENT REVIEW QUESTIONS: The patient responded to the following health history questions as indicated:    1. Are you having any GI issues? no 2. Do you have a personal history of Polyps? no 3. Do you have a family history of Colon Cancer or Polyps? no 4. Diabetes Mellitus? no 5. Joint replacements in the past 12 months?no 6. Major health problems in the past 3 months?Currently being treated for UTI 7. Any artificial heart valves, MVP, or defibrillator?no    MEDICATIONS & ALLERGIES:    Patient reports the following regarding taking any anticoagulation/antiplatelet therapy:   Plavix, Coumadin, Eliquis, Xarelto, Lovenox, Pradaxa, Brilinta, or Effient? no Aspirin? no  Patient confirms/reports the following medications:  Current Outpatient Medications  Medication Sig Dispense Refill  . Cyanocobalamin (B-12 COMPLIANCE INJECTION IJ) Inject 1 Dose as directed every 30 (thirty) days.    . DULoxetine (CYMBALTA) 60 MG capsule Take 1 capsule by mouth daily.    . nitrofurantoin, macrocrystal-monohydrate, (MACROBID) 100 MG capsule Take 1 capsule (100 mg total) by mouth 2 (two) times daily. 20 capsule 0  . omeprazole (PRILOSEC) 40 MG capsule TAKE 1 CAPSULE BY MOUTH EVERY DAY (MORNING) 30 capsule 11  . phenazopyridine (PYRIDIUM) 200 MG tablet Take 1 tablet (200 mg total) by mouth 3 (three) times daily as needed for pain. 12 tablet 0  . rOPINIRole (REQUIP) 0.25 MG tablet Take by mouth.    . ergocalciferol (VITAMIN D2) 1.25 MG (50000 UT) capsule Take 1 capsule by mouth once a week.    . ondansetron (ZOFRAN) 4 MG tablet Take 1 tablet (4 mg total) by mouth every 8 (eight) hours as needed for nausea or vomiting. (Patient not taking: Reported on 10/08/2019) 20 tablet 0   No current facility-administered medications for this visit.   Facility-Administered Medications Ordered in Other Visits  Medication  Dose Route Frequency Provider Last Rate Last Admin  . cyanocobalamin ((VITAMIN B-12)) injection 1,000 mcg  1,000 mcg Intramuscular Q30 days Sindy Guadeloupe, MD   1,000 mcg at 08/03/18 0936  . cyanocobalamin ((VITAMIN B-12)) injection 1,000 mcg  1,000 mcg Intramuscular Q30 days Sindy Guadeloupe, MD   1,000 mcg at 09/28/18 1013  . cyanocobalamin ((VITAMIN B-12)) injection 1,000 mcg  1,000 mcg Intramuscular Q30 days Sindy Guadeloupe, MD   1,000 mcg at 11/30/18 1355  . cyanocobalamin ((VITAMIN B-12)) injection 1,000 mcg  1,000 mcg Intramuscular Q30 days Sindy Guadeloupe, MD   1,000 mcg at 08/16/19 1144    Patient confirms/reports the following allergies:  No Known Allergies  No orders of the defined types were placed in this encounter.   AUTHORIZATION INFORMATION Primary Insurance: 1D#: Group #:  Secondary Insurance: 1D#: Group #:  SCHEDULE INFORMATION: Date: Tuesday 10/29/19 Time: Location:[facility]

## 2019-10-11 ENCOUNTER — Inpatient Hospital Stay: Payer: Medicare HMO | Attending: Oncology

## 2019-10-11 ENCOUNTER — Other Ambulatory Visit: Payer: Self-pay

## 2019-10-11 DIAGNOSIS — E538 Deficiency of other specified B group vitamins: Secondary | ICD-10-CM | POA: Insufficient documentation

## 2019-10-11 DIAGNOSIS — Z23 Encounter for immunization: Secondary | ICD-10-CM

## 2019-10-11 DIAGNOSIS — Z853 Personal history of malignant neoplasm of breast: Secondary | ICD-10-CM | POA: Diagnosis not present

## 2019-10-11 DIAGNOSIS — C7491 Malignant neoplasm of unspecified part of right adrenal gland: Secondary | ICD-10-CM | POA: Insufficient documentation

## 2019-10-11 MED ORDER — CYANOCOBALAMIN 1000 MCG/ML IJ SOLN
1000.0000 ug | INTRAMUSCULAR | Status: DC
  Administered 2019-10-11: 1000 ug via INTRAMUSCULAR
  Filled 2019-10-11: qty 1

## 2019-10-18 ENCOUNTER — Encounter: Payer: Self-pay | Admitting: Surgical

## 2019-10-18 ENCOUNTER — Ambulatory Visit (INDEPENDENT_AMBULATORY_CARE_PROVIDER_SITE_OTHER): Payer: Medicare HMO | Admitting: Surgical

## 2019-10-18 ENCOUNTER — Other Ambulatory Visit: Payer: Self-pay

## 2019-10-18 VITALS — BP 143/90 | HR 97 | Temp 98.2°F

## 2019-10-18 DIAGNOSIS — N6489 Other specified disorders of breast: Secondary | ICD-10-CM

## 2019-10-18 DIAGNOSIS — Z171 Estrogen receptor negative status [ER-]: Secondary | ICD-10-CM | POA: Diagnosis not present

## 2019-10-18 DIAGNOSIS — Z9011 Acquired absence of right breast and nipple: Secondary | ICD-10-CM | POA: Diagnosis not present

## 2019-10-18 DIAGNOSIS — Z923 Personal history of irradiation: Secondary | ICD-10-CM

## 2019-10-18 DIAGNOSIS — Z719 Counseling, unspecified: Secondary | ICD-10-CM

## 2019-10-18 DIAGNOSIS — C50511 Malignant neoplasm of lower-outer quadrant of right female breast: Secondary | ICD-10-CM | POA: Diagnosis not present

## 2019-10-18 NOTE — Progress Notes (Signed)
   Subjective:     Patient ID: Denise Watts, female    DOB: 1956/01/06, 64 y.o.   MRN: [id number]  Chief Complaint  Patient presents with  . Follow-up    HPI: The patient is a 64 y.o. female here for follow-up after left breast reduction/mastopexy and release of right breast scar contracture and fat filling for contour abnormalities on 06/10/2019 with Dr. Marla Roe.  Patient reports that she is doing well today in regards to her bilateral breast.  She reports that she is currently dealing with a rash of her bilateral lower extremities which her PCP is currently treating.  She is also seeing her dermatologist for management of multiple skin cancer lesions.  She has had multiple excisions of these in the past few years.    Review of Systems  Respiratory: Negative.   Cardiovascular: Negative.   Skin: Positive for color change and rash. Negative for wound.     Objective:   Vital Signs BP (!) 143/90 (BP Location: Left Arm, Patient Position: Sitting, Cuff Size: Normal)   Pulse 97   Temp 98.2 F (36.8 C) (Temporal)   SpO2 98%  Vital Signs and Nursing Note Reviewed Chaperone present Physical Exam Constitutional:      Appearance: Normal appearance.     Comments: Thin female  HENT:     Head: Normocephalic and atraumatic.  Pulmonary:     Effort: Pulmonary effort is normal.  Skin:      Neurological:     General: No focal deficit present.     Mental Status: She is alert and oriented to person, place, and time. Mental status is at baseline.  Psychiatric:        Mood and Affect: Mood normal.        Behavior: Behavior normal.     Assessment/Plan:     ICD-10-CM   1. Status post partial mastectomy of right breast  Z90.11   2. Postoperative breast asymmetry  N64.89   3. Malignant neoplasm of lower-outer quadrant of right breast of female, estrogen receptor negative ([place])  C50.511    Z17.1   4. History of radiation therapy  Z92.3     Patient is doing well in regards to her left  breast mastopexy and right breast Lipo filling, recommend following up in 1 year for reevaluation.  Recommend calling with any questions or concerns.  Recommend following up with dermatology for continued evaluation of multiple skin cancer lesions and following up with PCP in regards to her lower extremity rash.  Patient asked for advice on treating scars, discussed with patient we typically recommend scar creams for incisions that have healed and have no open wounds.  She expressed desire to start a scar cream.  Recommend skinuva.  Recommend a spot test prior to using on larger area, discussed that skin above may not be as beneficial for his that have healed many months or years ago. Apply 2 x daily for 3 months.   Carola Rhine Floyde Dingley, PA-C 10/18/2019, 2:37 PM

## 2019-10-25 ENCOUNTER — Other Ambulatory Visit
Admission: RE | Admit: 2019-10-25 | Discharge: 2019-10-25 | Disposition: A | Discharge status: Home / Self Care | Payer: Medicare HMO | Source: Ambulatory Visit | Attending: Gastroenterology | Admitting: Gastroenterology

## 2019-10-25 ENCOUNTER — Other Ambulatory Visit: Payer: Self-pay

## 2019-10-25 DIAGNOSIS — Z01812 Encounter for preprocedural laboratory examination: Secondary | ICD-10-CM | POA: Diagnosis not present

## 2019-10-25 DIAGNOSIS — Z20822 Contact with and (suspected) exposure to covid-19: Secondary | ICD-10-CM | POA: Diagnosis not present

## 2019-10-26 ENCOUNTER — Other Ambulatory Visit: Payer: Self-pay | Admitting: Family Medicine

## 2019-10-26 LAB — SARS CORONAVIRUS 2 (TAT 6-24 HRS): SARS Coronavirus 2: NEGATIVE

## 2019-10-26 NOTE — Telephone Encounter (Signed)
Requested Prescriptions  Pending Prescriptions Disp Refills  . 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

## 2019-10-28 MED ORDER — SODIUM CHLORIDE 0.9 % IV SOLN: INTRAVENOUS | Status: DC

## 2019-10-29 ENCOUNTER — Ambulatory Visit: Payer: Medicare HMO | Admitting: Certified Registered"

## 2019-10-29 ENCOUNTER — Encounter: Payer: Self-pay | Admitting: Gastroenterology

## 2019-10-29 ENCOUNTER — Encounter
Admission: RE | Disposition: A | Discharge status: Home / Self Care | Payer: Self-pay | Source: Home / Self Care | Attending: Gastroenterology

## 2019-10-29 ENCOUNTER — Ambulatory Visit
Admission: RE | Admit: 2019-10-29 | Discharge: 2019-10-29 | Disposition: A | Discharge status: Home / Self Care | Payer: Medicare HMO | Attending: Gastroenterology | Admitting: Gastroenterology

## 2019-10-29 DIAGNOSIS — K219 Gastro-esophageal reflux disease without esophagitis: Secondary | ICD-10-CM | POA: Insufficient documentation

## 2019-10-29 DIAGNOSIS — F329 Major depressive disorder, single episode, unspecified: Secondary | ICD-10-CM | POA: Diagnosis not present

## 2019-10-29 DIAGNOSIS — K641 Second degree hemorrhoids: Secondary | ICD-10-CM | POA: Insufficient documentation

## 2019-10-29 DIAGNOSIS — Z79899 Other long term (current) drug therapy: Secondary | ICD-10-CM | POA: Diagnosis not present

## 2019-10-29 DIAGNOSIS — K5790 Diverticulosis of intestine, part unspecified, without perforation or abscess without bleeding: Secondary | ICD-10-CM | POA: Diagnosis not present

## 2019-10-29 DIAGNOSIS — M1711 Unilateral primary osteoarthritis, right knee: Secondary | ICD-10-CM | POA: Diagnosis not present

## 2019-10-29 DIAGNOSIS — Z1211 Encounter for screening for malignant neoplasm of colon: Secondary | ICD-10-CM | POA: Insufficient documentation

## 2019-10-29 DIAGNOSIS — D125 Benign neoplasm of sigmoid colon: Secondary | ICD-10-CM | POA: Insufficient documentation

## 2019-10-29 DIAGNOSIS — Z853 Personal history of malignant neoplasm of breast: Secondary | ICD-10-CM | POA: Diagnosis not present

## 2019-10-29 DIAGNOSIS — F419 Anxiety disorder, unspecified: Secondary | ICD-10-CM | POA: Diagnosis not present

## 2019-10-29 DIAGNOSIS — Z9221 Personal history of antineoplastic chemotherapy: Secondary | ICD-10-CM | POA: Insufficient documentation

## 2019-10-29 DIAGNOSIS — K5730 Diverticulosis of large intestine without perforation or abscess without bleeding: Secondary | ICD-10-CM | POA: Insufficient documentation

## 2019-10-29 DIAGNOSIS — K635 Polyp of colon: Secondary | ICD-10-CM

## 2019-10-29 DIAGNOSIS — Z923 Personal history of irradiation: Secondary | ICD-10-CM | POA: Insufficient documentation

## 2019-10-29 DIAGNOSIS — Z87891 Personal history of nicotine dependence: Secondary | ICD-10-CM | POA: Diagnosis not present

## 2019-10-29 DIAGNOSIS — G709 Myoneural disorder, unspecified: Secondary | ICD-10-CM | POA: Insufficient documentation

## 2019-10-29 DIAGNOSIS — Z8521 Personal history of malignant neoplasm of larynx: Secondary | ICD-10-CM | POA: Insufficient documentation

## 2019-10-29 DIAGNOSIS — D123 Benign neoplasm of transverse colon: Secondary | ICD-10-CM | POA: Diagnosis not present

## 2019-10-29 DIAGNOSIS — D126 Benign neoplasm of colon, unspecified: Secondary | ICD-10-CM | POA: Diagnosis not present

## 2019-10-29 HISTORY — PX: COLONOSCOPY WITH PROPOFOL: SHX5780

## 2019-10-29 SURGERY — COLONOSCOPY WITH PROPOFOL
Anesthesia: General

## 2019-10-29 MED ORDER — PROPOFOL 10 MG/ML IV BOLUS
INTRAVENOUS | Status: DC | PRN
  Administered 2019-10-29: 50 mg via INTRAVENOUS

## 2019-10-29 MED ORDER — SODIUM CHLORIDE 0.9 % IV SOLN: INTRAVENOUS | Status: DC

## 2019-10-29 MED ORDER — PROPOFOL 10 MG/ML IV BOLUS
INTRAVENOUS | Status: AC
  Filled 2019-10-29: qty 40

## 2019-10-29 MED ORDER — PROPOFOL 500 MG/50ML IV EMUL
INTRAVENOUS | Status: DC | PRN
  Administered 2019-10-29: 150 ug/kg/min via INTRAVENOUS

## 2019-10-29 NOTE — H&P (Signed)
Lucilla Lame, MD [address], [place] [address] Phone: [phone] Fax : [phone]  Primary Care Physician:  Margo Common, PA Primary Gastroenterologist:  Dr. Allen Norris  Pre-Procedure History & Physical: HPI:  Denise Watts is a 64 y.o. female is here for a screening colonoscopy.   Past Medical History:  Diagnosis Date  . Anxiety   . Arthritis    KNEE RIGHT  . Breast cancer [facility]) 1997   right breast lumpectomy with radiation  . Breast cancer ([place]) 11/15/2017   INVASIVE MAMMARY CARCINOMA/ triple negative  . 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 Cough 03/02/2017   INTERMTTENT DRY COUGH  . Depression   . GERD (gastroesophageal reflux disease)   . History of hiatal hernia   . Personal history of chemotherapy   . Personal history of radiation therapy   . Skin cancer   . Throat cancer [facility]) 1998   radiation    Past Surgical History:  Procedure Laterality Date  . BREAST BIOPSY Right 1997   positive  . BREAST BIOPSY Right 11/15/2017   INVASIVE MAMMARY CARCINOMA triple negative  . BREAST LUMPECTOMY Right 1997   2019 also  . BREAST LUMPECTOMY WITH SENTINEL LYMPH NODE BIOPSY Right 12/08/2017   Procedure: BREAST LUMPECTOMY WITH SENTINEL LYMPH NODE BX;  Surgeon: Robert Bellow, MD;  Location: [facility] ORS;  Service: General;  Laterality: Right;  . BREAST REDUCTION WITH MASTOPEXY Left 06/10/2019   Procedure: Left breast mastopexy reduction for symmetry;  Surgeon: Wallace Going, DO;  Location: [place];  Service: Plastics;  Laterality: Left;  total 2.5 hours  . COLONOSCOPY  2015  . ESOPHAGOGASTRODUODENOSCOPY (EGD) WITH PROPOFOL N/A 05/24/2019   Procedure: ESOPHAGOGASTRODUODENOSCOPY (EGD) WITH PROPOFOL;  Surgeon: Lucilla Lame, MD;  Location: [facility] ENDOSCOPY;  Service: Endoscopy;  Laterality: N/A;  . ESOPHAGUS SURGERY    . KNEE SURGERY Right   . LIPOSUCTION WITH LIPOFILLING Right 06/10/2019   Procedure: right breast scar and capsule  release with fat grafting;  Surgeon: Wallace Going, DO;  Location: [place];  Service: Plastics;  Laterality: Right;  . PORT-A-CATH REMOVAL    . PORTACATH PLACEMENT Right 01/17/2018   Procedure: INSERTION PORT-A-CATH- RIGHT;  Surgeon: Robert Bellow, MD;  Location: [facility] ORS;  Service: General;  Laterality: Right;  . ROBOTIC ADRENALECTOMY Right 12/28/2016   Procedure: ROBOTIC ADRENALECTOMY;  Surgeon: Hollice Espy, MD;  Location: [facility] ORS;  Service: Urology;  Laterality: Right;  . SKIN CANCER EXCISION    . THROAT SURGERY  1998   throat cancer   . TUBAL LIGATION    . VENTRAL HERNIA REPAIR N/A 03/03/2017   Procedure: HERNIA REPAIR VENTRAL ADULT;  Surgeon: Clayburn Pert, MD;  Location: [facility] ORS;  Service: General;  Laterality: N/A;    Prior to Admission medications   Medication Sig Start Date End Date Taking? Authorizing Provider  cetirizine (ZYRTEC) 10 MG tablet Take 10 mg by mouth daily.   Yes [provider]  Cyanocobalamin (B-12 COMPLIANCE INJECTION IJ) Inject 1 Dose as directed every 30 (thirty) days.   Yes [provider]  DULoxetine (CYMBALTA) 60 MG capsule Take 1 capsule by mouth daily. 08/12/19 08/11/20 Yes [provider]  omeprazole (PRILOSEC) 40 MG capsule TAKE 1 CAPSULE BY MOUTH EVERY DAY (MORNING) 10/26/19  Yes Chrismon, Vickki Muff, PA  ondansetron (ZOFRAN) 4 MG tablet Take 1 tablet (4 mg total) by mouth every 8 (eight) hours as needed for nausea or vomiting. 06/06/19  Yes Scheeler, Carola Rhine, PA-C  phenazopyridine (PYRIDIUM) 200 MG tablet Take 1 tablet (200 mg total) by mouth 3 (three) times daily as needed for pain. 10/03/19  Yes Chrismon, Vickki Muff, PA  rOPINIRole (REQUIP) 0.25 MG tablet Take by mouth. 10/03/19 10/02/20 Yes [provider]    Allergies as of 10/08/2019  . (No Known Allergies)    Family History  Problem Relation Age of Onset  . Diabetes Mother   . Heart attack Mother   . Cancer Sister        cancer of  eyes, spread to lung, other places. died at 27  . Cancer Sister 98       unknown primary, spread to bone, brain died in 66's  . Cancer Paternal Aunt        unk type  . Cancer Paternal Grandmother        type unk  . Prostate cancer Neg Hx   . Kidney cancer Neg Hx   . Bladder Cancer Neg Hx     Social History   Socioeconomic History  . Marital status: Married    Spouse name: Not on file  . Number of children: Not on file  . Years of education: Not on file  . Highest education level: Not on file  Occupational History  . Not on file  Tobacco Use  . Smoking status: Former Smoker    Packs/day: 1.50    Years: 30.00    Pack years: 45.00    Types: Cigarettes    Quit date: 05/02/1996    Years since quitting: 23.5  . Smokeless tobacco: Never Used  Vaping Use  . Vaping Use: Never used  Substance and Sexual Activity  . Alcohol use: Yes    Alcohol/week: 0.0 standard drinks    Comment: occ.  . Drug use: No  . Sexual activity: Not on file  Other Topics Concern  . Not on file  Social History Narrative  . Not on file   Social Determinants of Health   Financial Resource Strain:   . Difficulty of Paying Living Expenses:   Food Insecurity:   . Worried About Charity fundraiser in the Last Year:   . Arboriculturist in the Last Year:   Transportation Needs:   . Film/video editor (Medical):   Marland Kitchen Lack of Transportation (Non-Medical):   Physical Activity:   . Days of Exercise per Week:   . Minutes of Exercise per Session:   Stress:   . Feeling of Stress :   Social Connections:   . Frequency of Communication with Friends and Family:   . Frequency of Social Gatherings with Friends and Family:   . Attends Religious Services:   . Active Member of Clubs or Organizations:   . Attends Archivist Meetings:   Marland Kitchen Marital Status:   Intimate Partner Violence:   . Fear of Current or Ex-Partner:   . Emotionally Abused:   Marland Kitchen Physically Abused:   . Sexually Abused:     Review  of Systems: See HPI, otherwise negative ROS  Physical Exam: BP 130/85   Pulse 86   Temp (!) 96.9 F (36.1 C) (Temporal)   Resp 16   Ht 5\' 1"  (1.549 m)   Wt 57.2 kg   SpO2 100%   BMI 23.81 kg/m  General:   Alert,  pleasant and cooperative in NAD Head:  Normocephalic and atraumatic. Neck:  Supple; no masses or thyromegaly. Lungs:  Clear throughout to auscultation.  Heart:  Regular rate and rhythm. Abdomen:  Soft, nontender and nondistended. Normal bowel sounds, without guarding, and without rebound.   Neurologic:  Alert and  oriented x4;  grossly normal neurologically.  Impression/Plan: Denise Watts is now here to undergo a screening colonoscopy.  Risks, benefits, and alternatives regarding colonoscopy have been reviewed with the patient.  Questions have been answered.  All parties agreeable.

## 2019-10-29 NOTE — Transfer of Care (Signed)
Immediate Anesthesia Transfer of Care Note  Patient: Denise Watts  Procedure(s) Performed: COLONOSCOPY WITH PROPOFOL (N/A )  Patient Location: Endoscopy Unit  Anesthesia Type:General  Level of Consciousness: awake, alert  and oriented  Airway & Oxygen Therapy: Patient Spontanous Breathing and Patient connected to nasal cannula oxygen  Post-op Assessment: Report given to RN and Post -op Vital signs reviewed and stable  Post vital signs: Reviewed and stable  Last Vitals:  Vitals Value Taken Time  BP    Temp    Pulse    Resp    SpO2      Last Pain:  Vitals:   10/29/19 1018  TempSrc: Temporal  PainSc: 0-No pain         Complications: No complications documented.

## 2019-10-29 NOTE — Anesthesia Preprocedure Evaluation (Signed)
Anesthesia Evaluation  Patient identified by MRN, date of birth, ID band Patient awake    Reviewed: Allergy & Precautions, H&P , NPO status , Patient's Chart, lab work & pertinent test results, reviewed documented beta blocker date and time   History of Anesthesia Complications Negative for: history of anesthetic complications  Airway Mallampati: II   Neck ROM: full    Dental  (+) Poor Dentition, Dental Advidsory Given   Pulmonary neg shortness of breath, neg COPD, neg recent URI, former smoker,    Pulmonary exam normal        Cardiovascular Exercise Tolerance: Good negative cardio ROS Normal cardiovascular exam Rhythm:regular Rate:Normal     Neuro/Psych neg Seizures PSYCHIATRIC DISORDERS Anxiety Depression  Neuromuscular disease    GI/Hepatic Neg liver ROS, hiatal hernia, GERD  Medicated,  Endo/Other  negative endocrine ROS  Renal/GU negative Renal ROS  negative genitourinary   Musculoskeletal   Abdominal   Peds  Hematology negative hematology ROS (+)   Anesthesia Other Findings Past Medical History: No date: Arthritis     Comment:  KNEE RIGHT 1997: Breast cancer ([place])     Comment:  right breast lumpectomy with radiation 11/15/2017: Breast cancer ([place])     Comment:  INVASIVE MAMMARY CARCINOMA/ triple negative 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[place]) 03/02/2017: Cough     Comment:  INTERMTTENT DRY COUGH No date: GERD (gastroesophageal reflux disease) No date: History of hiatal hernia No date: Personal history of chemotherapy No date: Personal history of radiation therapy No date: Skin cancer 1998: Throat cancer [facility])     Comment:  radiation Past Surgical History: 1997: BREAST BIOPSY; Right     Comment:  positive 11/15/2017: BREAST BIOPSY; Right     Comment:  INVASIVE MAMMARY CARCINOMA triple negative 1997: BREAST LUMPECTOMY; Right     Comment:  2019 also 12/08/2017: BREAST LUMPECTOMY WITH SENTINEL LYMPH  NODE BIOPSY; Right     Comment:  Procedure: BREAST LUMPECTOMY WITH SENTINEL LYMPH NODE               BX;  Surgeon: Robert Bellow, MD;  Location: [facility]               ORS;  Service: General;  Laterality: Right; 2015: COLONOSCOPY No date: ESOPHAGUS SURGERY No date: KNEE SURGERY; Right No date: PORT-A-CATH REMOVAL 01/17/2018: PORTACATH PLACEMENT; Right     Comment:  Procedure: INSERTION PORT-A-CATH- RIGHT;  Surgeon:               Robert Bellow, MD;  Location: [place] ORS;  Service:               General;  Laterality: Right; 12/28/2016: ROBOTIC ADRENALECTOMY; Right     Comment:  Procedure: ROBOTIC ADRENALECTOMY;  Surgeon: Hollice Espy, MD;  Location: [facility] ORS;  Service: Urology;                Laterality: Right; No date: SKIN CANCER EXCISION 1998: THROAT SURGERY     Comment:  throat cancer  No date: TUBAL LIGATION 03/03/2017: VENTRAL HERNIA REPAIR; N/A     Comment:  Procedure: HERNIA REPAIR VENTRAL ADULT;  Surgeon:               Clayburn Pert, MD;  Location: [facility] ORS;  Service:               General;  Laterality: N/A; BMI    Body Mass Index: 23.43 kg/m  Reproductive/Obstetrics negative OB ROS                             Anesthesia Physical  Anesthesia Plan  ASA: II  Anesthesia Plan: General   Post-op Pain Management:    Induction: Intravenous  PONV Risk Score and Plan: 3 and Propofol infusion and TIVA  Airway Management Planned: Natural Airway and Nasal Cannula  Additional Equipment:   Intra-op Plan:   Post-operative Plan:   Informed Consent: I have reviewed the patients History and Physical, chart, labs and discussed the procedure including the risks, benefits and alternatives for the proposed anesthesia with the patient or authorized representative who has indicated his/her understanding and acceptance.     Dental Advisory Given  Plan Discussed with: CRNA  Anesthesia Plan Comments:         Anesthesia  Quick Evaluation

## 2019-10-29 NOTE — Op Note (Signed)
[facility] Gastroenterology Patient Name: Denise Watts Procedure Date: 10/29/2019 11:26 AM MRN: [id number] Account #: [id number] Date of Birth: December 17, 1955 Admit Type: Outpatient Age: 64 Room: [facility] ENDO ROOM 4 Gender: Female Note Status: Finalized Procedure:             Colonoscopy Indications:           Screening for colorectal malignant neoplasm Providers:             Lucilla Lame MD, MD Referring MD:          Vickki Muff. Chrismon, MD (Referring MD) Medicines:             Propofol per Anesthesia Complications:         No immediate complications. Procedure:             Pre-Anesthesia Assessment:                        - Prior to the procedure, a History and Physical was                         performed, and patient medications and allergies were                         reviewed. The patient's tolerance of previous                         anesthesia was also reviewed. The risks and benefits                         of the procedure and the sedation options and risks                         were discussed with the patient. All questions were                         answered, and informed consent was obtained. Prior                         Anticoagulants: The patient has taken no previous                         anticoagulant or antiplatelet agents. ASA Grade                         Assessment: II - A patient with mild systemic disease.                         After reviewing the risks and benefits, the patient                         was deemed in satisfactory condition to undergo the                         procedure.                        After obtaining informed consent, the colonoscope was  passed under direct vision. Throughout the procedure,                         the patient's blood pressure, pulse, and oxygen                         saturations were monitored continuously. The                         Colonoscope was introduced through  the anus and                         advanced to the the cecum, identified by appendiceal                         orifice and ileocecal valve. The colonoscopy was                         performed without difficulty. The patient tolerated                         the procedure well. The quality of the bowel                         preparation was fair. Findings:      The perianal and digital rectal examinations were normal.      A 4 mm polyp was found in the transverse colon. The polyp was sessile.       The polyp was removed with a cold snare. Resection and retrieval were       complete.      A 4 mm polyp was found in the sigmoid colon. The polyp was sessile. The       polyp was removed with a cold snare. Resection and retrieval were       complete.      A 6 mm polyp was found in the sigmoid colon. The polyp was sessile. The       polyp was removed with a cold snare. Resection and retrieval were       complete.      Non-bleeding internal hemorrhoids were found during retroflexion. The       hemorrhoids were Grade II (internal hemorrhoids that prolapse but reduce       spontaneously).      Multiple small-mouthed diverticula were found in the entire colon. Impression:            - Preparation of the colon was fair.                        - One 4 mm polyp in the transverse colon, removed with                         a cold snare. Resected and retrieved.                        - One 4 mm polyp in the sigmoid colon, removed with a                         cold snare. Resected and retrieved.                        -  One 6 mm polyp in the sigmoid colon, removed with a                         cold snare. Resected and retrieved.                        - Non-bleeding internal hemorrhoids.                        - Diverticulosis in the entire examined colon. Recommendation:        - Discharge patient to home.                        - Resume previous diet.                        - Continue present  medications.                        - Await pathology results.                        - Repeat colonoscopy in 3 years if polyp adenoma and                         10 years if hyperplastic Procedure Code(s):     --- Professional ---                        [phone], Colonoscopy, flexible; with removal of                         tumor(s), polyp(s), or other lesion(s) by snare                         technique Diagnosis Code(s):     --- Professional ---                        Z12.11, Encounter for screening for malignant neoplasm                         of colon                        K63.5, Polyp of colon CPT copyright 2019 American Medical Association. All rights reserved. The codes documented in this report are preliminary and upon coder review may  be revised to meet current compliance requirements. Lucilla Lame MD, MD 10/29/2019 11:50:16 AM This report has been signed electronically. Number of Addenda: 0 Note Initiated On: 10/29/2019 11:26 AM Scope Withdrawal Time: 0 hours 7 minutes 41 seconds  Total Procedure Duration: 0 hours 13 minutes 32 seconds  Estimated Blood Loss:  Estimated blood loss: none.      [facility]

## 2019-10-30 ENCOUNTER — Encounter: Payer: Self-pay | Admitting: Gastroenterology

## 2019-10-30 LAB — SURGICAL PATHOLOGY

## 2019-10-30 NOTE — Anesthesia Postprocedure Evaluation (Signed)
Anesthesia Post Note  Patient: Denise Watts  Procedure(s) Performed: COLONOSCOPY WITH PROPOFOL (N/A )  Patient location during evaluation: Endoscopy Anesthesia Type: General Level of consciousness: awake and alert and oriented Pain management: pain level controlled Vital Signs Assessment: post-procedure vital signs reviewed and stable Respiratory status: spontaneous breathing Cardiovascular status: blood pressure returned to baseline Anesthetic complications: no   No complications documented.   Last Vitals:  Vitals:   10/29/19 1210 10/29/19 1220  BP: 109/84 (!) 138/100  Pulse: 80 77  Resp: 17 17  Temp:    SpO2: 100% 98%    Last Pain:  Vitals:   10/30/19 0754  TempSrc:   PainSc: 0-No pain                 [name]

## 2019-11-08 ENCOUNTER — Inpatient Hospital Stay: Payer: Medicare HMO | Attending: Oncology

## 2019-11-08 ENCOUNTER — Other Ambulatory Visit: Payer: Self-pay

## 2019-11-08 DIAGNOSIS — E538 Deficiency of other specified B group vitamins: Secondary | ICD-10-CM | POA: Insufficient documentation

## 2019-11-08 DIAGNOSIS — Z853 Personal history of malignant neoplasm of breast: Secondary | ICD-10-CM | POA: Insufficient documentation

## 2019-11-08 DIAGNOSIS — Z23 Encounter for immunization: Secondary | ICD-10-CM

## 2019-11-08 DIAGNOSIS — R5383 Other fatigue: Secondary | ICD-10-CM | POA: Diagnosis not present

## 2019-11-08 DIAGNOSIS — Z171 Estrogen receptor negative status [ER-]: Secondary | ICD-10-CM | POA: Insufficient documentation

## 2019-11-08 DIAGNOSIS — R5381 Other malaise: Secondary | ICD-10-CM | POA: Diagnosis not present

## 2019-11-08 MED ORDER — CYANOCOBALAMIN 1000 MCG/ML IJ SOLN
1000.0000 ug | INTRAMUSCULAR | Status: DC
  Administered 2019-11-08: 1000 ug via INTRAMUSCULAR
  Filled 2019-11-08: qty 1

## 2019-11-19 DIAGNOSIS — D485 Neoplasm of uncertain behavior of skin: Secondary | ICD-10-CM | POA: Diagnosis not present

## 2019-11-19 DIAGNOSIS — C44521 Squamous cell carcinoma of skin of breast: Secondary | ICD-10-CM | POA: Diagnosis not present

## 2019-11-19 DIAGNOSIS — C44622 Squamous cell carcinoma of skin of right upper limb, including shoulder: Secondary | ICD-10-CM | POA: Diagnosis not present

## 2019-11-19 DIAGNOSIS — C44722 Squamous cell carcinoma of skin of right lower limb, including hip: Secondary | ICD-10-CM | POA: Diagnosis not present

## 2019-11-19 DIAGNOSIS — Z08 Encounter for follow-up examination after completed treatment for malignant neoplasm: Secondary | ICD-10-CM | POA: Diagnosis not present

## 2019-11-19 DIAGNOSIS — D045 Carcinoma in situ of skin of trunk: Secondary | ICD-10-CM | POA: Diagnosis not present

## 2019-11-19 DIAGNOSIS — D0462 Carcinoma in situ of skin of left upper limb, including shoulder: Secondary | ICD-10-CM | POA: Diagnosis not present

## 2019-11-19 DIAGNOSIS — C44729 Squamous cell carcinoma of skin of left lower limb, including hip: Secondary | ICD-10-CM | POA: Diagnosis not present

## 2019-11-19 DIAGNOSIS — C44619 Basal cell carcinoma of skin of left upper limb, including shoulder: Secondary | ICD-10-CM | POA: Diagnosis not present

## 2019-11-19 DIAGNOSIS — Z85828 Personal history of other malignant neoplasm of skin: Secondary | ICD-10-CM | POA: Diagnosis not present

## 2019-11-19 DIAGNOSIS — R208 Other disturbances of skin sensation: Secondary | ICD-10-CM | POA: Diagnosis not present

## 2019-11-19 DIAGNOSIS — L570 Actinic keratosis: Secondary | ICD-10-CM | POA: Diagnosis not present

## 2019-12-13 ENCOUNTER — Inpatient Hospital Stay: Payer: Medicare HMO | Attending: Oncology

## 2019-12-13 ENCOUNTER — Other Ambulatory Visit: Payer: Self-pay

## 2019-12-13 DIAGNOSIS — E538 Deficiency of other specified B group vitamins: Secondary | ICD-10-CM | POA: Diagnosis present

## 2019-12-13 DIAGNOSIS — D538 Other specified nutritional anemias: Secondary | ICD-10-CM | POA: Diagnosis not present

## 2019-12-13 DIAGNOSIS — Z853 Personal history of malignant neoplasm of breast: Secondary | ICD-10-CM | POA: Insufficient documentation

## 2019-12-13 DIAGNOSIS — C7491 Malignant neoplasm of unspecified part of right adrenal gland: Secondary | ICD-10-CM | POA: Diagnosis not present

## 2019-12-13 DIAGNOSIS — Z23 Encounter for immunization: Secondary | ICD-10-CM

## 2019-12-13 MED ORDER — CYANOCOBALAMIN 1000 MCG/ML IJ SOLN
1000.0000 ug | INTRAMUSCULAR | Status: DC
  Administered 2019-12-13: 1000 ug via INTRAMUSCULAR
  Filled 2019-12-13: qty 1

## 2019-12-16 ENCOUNTER — Ambulatory Visit
Admission: RE | Admit: 2019-12-16 | Discharge: 2019-12-16 | Disposition: A | Discharge status: Home / Self Care | Payer: Medicare HMO | Source: Ambulatory Visit | Attending: Surgery | Admitting: Surgery

## 2019-12-16 ENCOUNTER — Other Ambulatory Visit: Payer: Self-pay

## 2019-12-16 DIAGNOSIS — C50511 Malignant neoplasm of lower-outer quadrant of right female breast: Secondary | ICD-10-CM | POA: Insufficient documentation

## 2019-12-16 DIAGNOSIS — R921 Mammographic calcification found on diagnostic imaging of breast: Secondary | ICD-10-CM | POA: Diagnosis not present

## 2019-12-16 DIAGNOSIS — Z171 Estrogen receptor negative status [ER-]: Secondary | ICD-10-CM | POA: Insufficient documentation

## 2019-12-30 ENCOUNTER — Other Ambulatory Visit: Payer: Self-pay

## 2019-12-30 ENCOUNTER — Telehealth: Payer: Self-pay

## 2019-12-30 ENCOUNTER — Ambulatory Visit
Admission: RE | Admit: 2019-12-30 | Discharge: 2019-12-30 | Disposition: A | Discharge status: Home / Self Care | Payer: Medicare HMO | Source: Ambulatory Visit | Attending: Surgery | Admitting: Surgery

## 2019-12-30 ENCOUNTER — Ambulatory Visit (INDEPENDENT_AMBULATORY_CARE_PROVIDER_SITE_OTHER): Payer: Medicare HMO | Admitting: Surgery

## 2019-12-30 ENCOUNTER — Other Ambulatory Visit
Admission: RE | Admit: 2019-12-30 | Discharge: 2019-12-30 | Disposition: A | Discharge status: Home / Self Care | Payer: Medicare HMO | Source: Ambulatory Visit | Attending: Surgery | Admitting: Surgery

## 2019-12-30 ENCOUNTER — Encounter: Payer: Self-pay | Admitting: Surgery

## 2019-12-30 VITALS — BP 122/82 | HR 97 | Temp 98.4°F | Ht 61.0 in | Wt 131.2 lb

## 2019-12-30 DIAGNOSIS — I700 Atherosclerosis of aorta: Secondary | ICD-10-CM | POA: Diagnosis not present

## 2019-12-30 DIAGNOSIS — K5900 Constipation, unspecified: Secondary | ICD-10-CM | POA: Diagnosis not present

## 2019-12-30 DIAGNOSIS — R1031 Right lower quadrant pain: Secondary | ICD-10-CM | POA: Insufficient documentation

## 2019-12-30 DIAGNOSIS — N281 Cyst of kidney, acquired: Secondary | ICD-10-CM | POA: Diagnosis not present

## 2019-12-30 DIAGNOSIS — K5730 Diverticulosis of large intestine without perforation or abscess without bleeding: Secondary | ICD-10-CM | POA: Diagnosis not present

## 2019-12-30 LAB — COMPREHENSIVE METABOLIC PANEL
ALT: 15 U/L (ref 0–44)
AST: 22 U/L (ref 15–41)
Albumin: 3.9 g/dL (ref 3.5–5.0)
Alkaline Phosphatase: 75 U/L (ref 38–126)
Anion gap: 8 (ref 5–15)
BUN: 8 mg/dL (ref 8–23)
CO2: 29 mmol/L (ref 22–32)
Calcium: 8.7 mg/dL — ABNORMAL LOW (ref 8.9–10.3)
Chloride: 104 mmol/L (ref 98–111)
Creatinine, Ser: 0.84 mg/dL (ref 0.44–1.00)
GFR calc Af Amer: >60 mL/min (ref >60)
GFR calc non Af Amer: >60 mL/min (ref >60)
Glucose, Bld: 90 mg/dL (ref 70–99)
Potassium: 3.7 mmol/L (ref 3.5–5.1)
Sodium: 141 mmol/L (ref 135–145)
Total Bilirubin: 0.7 mg/dL (ref 0.3–1.2)
Total Protein: 6.8 g/dL (ref 6.5–8.1)

## 2019-12-30 LAB — CBC WITH DIFFERENTIAL/PLATELET
Abs Immature Granulocytes: 0.05 10*3/uL (ref 0.00–0.07)
Basophils Absolute: 0.1 10*3/uL (ref 0.0–0.1)
Basophils Relative: 1 %
Eosinophils Absolute: 0.2 10*3/uL (ref 0.0–0.5)
Eosinophils Relative: 3 %
HCT: 42.1 % (ref 36.0–46.0)
Hemoglobin: 14.1 g/dL (ref 12.0–15.0)
Immature Granulocytes: 1 %
Lymphocytes Relative: 25 %
Lymphs Abs: 1.5 10*3/uL (ref 0.7–4.0)
MCH: 33.1 pg (ref 26.0–34.0)
MCHC: 33.5 g/dL (ref 30.0–36.0)
MCV: 98.8 fL (ref 80.0–100.0)
Monocytes Absolute: 0.5 10*3/uL (ref 0.1–1.0)
Monocytes Relative: 8 %
Neutro Abs: 3.7 10*3/uL (ref 1.7–7.7)
Neutrophils Relative %: 62 %
Platelets: 245 10*3/uL (ref 150–400)
RBC: 4.26 MIL/uL (ref 3.87–5.11)
RDW: 11.6 % (ref 11.5–15.5)
Smear Review: NORMAL
WBC: 6 10*3/uL (ref 4.0–10.5)
nRBC: 0 % (ref 0.0–0.2)

## 2019-12-30 MED ORDER — IOHEXOL 300 MG/ML  SOLN
100.0000 mL | Freq: Once | INTRAMUSCULAR | Status: AC | PRN
  Administered 2019-12-30: 100 mL via INTRAVENOUS

## 2019-12-30 NOTE — Patient Instructions (Addendum)
Breast Self-Awareness Breast self-awareness is knowing how your breasts look and feel. Doing breast self-awareness is important. It allows you to catch a breast problem early while it is still small and can be treated. All women should do breast self-awareness, including women who have had breast implants. Tell your doctor if you notice a change in your breasts. What you need:  A mirror.  A well-lit room. How to do a breast self-exam A breast self-exam is one way to learn what is normal for your breasts and to check for changes. To do a breast self-exam: Look for changes  1. Take off all the clothes above your waist. 2. Stand in front of a mirror in a room with good lighting. 3. Put your hands on your hips. 4. Push your hands down. 5. Look at your breasts and nipples in the mirror to see if one breast or nipple looks different from the other. Check to see if: ? The shape of one breast is different. ? The size of one breast is different. ? There are wrinkles, dips, and bumps in one breast and not the other. 6. Look at each breast for changes in the skin, such as: ? Redness. ? Scaly areas. 7. Look for changes in your nipples, such as: ? Liquid around the nipples. ? Bleeding. ? Dimpling. ? Redness. ? A change in where the nipples are. Feel for changes  1. Lie on your back on the floor. 2. Feel each breast. To do this, follow these steps: ? Pick a breast to feel. ? Put the arm closest to that breast above your head. ? Use your other arm to feel the nipple area of your breast. Feel the area with the pads of your three middle fingers by making small circles with your fingers. For the first circle, press lightly. For the second circle, press harder. For the third circle, press even harder. ? Keep making circles with your fingers at the different pressures as you move down your breast. Stop when you feel your ribs. ? Move your fingers a little toward the center of your body. ? Start  making circles with your fingers again, this time going up until you reach your collarbone. ? Keep making up-and-down circles until you reach your armpit. Remember to keep using the three pressures. ? Feel the other breast in the same way. 3. Sit or stand in the tub or shower. 4. With soapy water on your skin, feel each breast the same way you did in step 2 when you were lying on the floor. Write down what you find Writing down what you find can help you remember what to tell your doctor. Write down:  What is normal for each breast.  Any changes you find in each breast, including: ? The kind of changes you find. ? Whether you have pain. ? Size and location of any lumps.  When you last had your menstrual period. General tips  Check your breasts every month.  If you are breastfeeding, the best time to check your breasts is after you feed your baby or after you use a breast pump.  If you get menstrual periods, the best time to check your breasts is 5-7 days after your menstrual period is over.  With time, you will become comfortable with the self-exam, and you will begin to know if there are changes in your breasts. Contact a doctor if you:  See a change in the shape or size of your breasts or  nipples.  See a change in the skin of your breast or nipples, such as red or scaly skin.  Have fluid coming from your nipples that is not normal.  Find a lump or thick area that was not there before.  Have pain in your breasts.  Have any concerns about your breast health. Summary  Breast self-awareness includes looking for changes in your breasts, as well as feeling for changes within your breasts.  Breast self-awareness should be done in front of a mirror in a well-lit room.  You should check your breasts every month. If you get menstrual periods, the best time to check your breasts is 5-7 days after your menstrual period is over.  Let your doctor know of any changes you see in your  breasts, including changes in size, changes on the skin, pain or tenderness, or fluid from your nipples that is not normal. This information is not intended to replace advice given to you by your health care provider. Make sure you discuss any questions you have with your health care provider. Document Revised: 12/05/2017 Document Reviewed: 12/05/2017 Elsevier Patient Education  2020 Elsevier Inc.  

## 2019-12-30 NOTE — Telephone Encounter (Signed)
-----   Message from Jules Husbands, MD sent at 12/30/2019  3:24 PM EDT ----- Please let her know her CT was nmnl, no appendicitis ----- Message ----- From: Interface, Rad Results In Sent: 12/30/2019   3:20 PM EDT To: Jules Husbands, MD

## 2019-12-30 NOTE — Telephone Encounter (Signed)
Left detailed message informing patient of normal recent lab work per Dr.Pabon. Advised patient to give our office a call back if she has any questions.

## 2019-12-30 NOTE — Telephone Encounter (Signed)
Spoke with patient and notified her of recent lab work and CT imaging results being "Normal" per Dr.Pabon. Patient was also made aware of her appointment scheduled for 01/08/20 at 10:45am with Dr.Pabon. Patient verbalized understanding and has no further questions.

## 2019-12-30 NOTE — Telephone Encounter (Signed)
-----   Message from Jules Husbands, MD sent at 12/30/2019  2:05 PM EDT ----- Please let her know blood work is nml ----- Message ----- From: Buel Ream, Lab In [place]: 12/30/2019   1:15 PM EDT To: Jules Husbands, MD

## 2019-12-31 ENCOUNTER — Encounter: Payer: Self-pay | Admitting: Surgery

## 2019-12-31 NOTE — Progress Notes (Signed)
Outpatient Surgical Follow Up  12/31/2019  Denise Watts is an 64 y.o. female.   Chief Complaint  Patient presents with  . Follow-up    HPI: Denise Watts is a 64 year old well known to me with a prior history of breast cancer( ER positive PR and HER-2 negative) on the right side status post lumpectomy and radiation therapy by Dr. Bary Castilla on July 2019 . [place] comes with several complaints one is persistent breast pain bilaterally worsening on the right side, pain is intermittent moderate intensity and worsening when she wears a bra.  More recently she developed right lower quadrant pain for the last 3 weeks or so pain is intermittent sharp in nature without specific alleviating or aggravating factors. 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 He also underwent reduction mammoplasty on the left side on the revision of lumpectomy scar on the right by Dr. Marla Roe last year.  She is more pleased with cosmetic results after having a revision by plastic surgery. He denies any nausea vomiting no fevers no chills.  She did have a recent mammogram that I have personally reviewed showing no evidence of suspicious lesions Patient mammogram personally reviewed showing no evidence of suspicious lesions.  Past Medical History:  Diagnosis Date  . Anxiety   . Arthritis    KNEE RIGHT  . Breast cancer [facility]) 1997   right breast lumpectomy with radiation  . Breast cancer ([place]) 11/15/2017   INVASIVE MAMMARY CARCINOMA/ triple negative  . 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[place]) 11/10/2014  . Cough 03/02/2017   INTERMTTENT DRY COUGH  . Depression   . GERD (gastroesophageal reflux disease)   . History of hiatal hernia   . Personal history of chemotherapy   . Personal history of radiation therapy   . Skin cancer   . Throat cancer [facility]) 1998   radiation    Past Surgical History:  Procedure Laterality Date  . BREAST BIOPSY Right 1997   positive  . BREAST BIOPSY Right 11/15/2017    INVASIVE MAMMARY CARCINOMA triple negative  . BREAST LUMPECTOMY Right 1997   2019 also  . BREAST LUMPECTOMY WITH SENTINEL LYMPH NODE BIOPSY Right 12/08/2017   Procedure: BREAST LUMPECTOMY WITH SENTINEL LYMPH NODE BX;  Surgeon: Robert Bellow, MD;  Location: [facility] ORS;  Service: General;  Laterality: Right;  . BREAST REDUCTION WITH MASTOPEXY Left 06/10/2019   Procedure: Left breast mastopexy reduction for symmetry;  Surgeon: Wallace Going, DO;  Location: [place];  Service: Plastics;  Laterality: Left;  total 2.5 hours  . COLONOSCOPY  2015  . COLONOSCOPY WITH PROPOFOL N/A 10/29/2019   Procedure: COLONOSCOPY WITH PROPOFOL;  Surgeon: Lucilla Lame, MD;  Location: [facility] ENDOSCOPY;  Service: Endoscopy;  Laterality: N/A;  . ESOPHAGOGASTRODUODENOSCOPY (EGD) WITH PROPOFOL N/A 05/24/2019   Procedure: ESOPHAGOGASTRODUODENOSCOPY (EGD) WITH PROPOFOL;  Surgeon: Lucilla Lame, MD;  Location: [facility] ENDOSCOPY;  Service: Endoscopy;  Laterality: N/A;  . ESOPHAGUS SURGERY    . KNEE SURGERY Right   . LIPOSUCTION WITH LIPOFILLING Right 06/10/2019   Procedure: right breast scar and capsule release with fat grafting;  Surgeon: Wallace Going, DO;  Location: [place];  Service: Plastics;  Laterality: Right;  . PORT-A-CATH REMOVAL    . PORTACATH PLACEMENT Right 01/17/2018   Procedure: INSERTION PORT-A-CATH- RIGHT;  Surgeon: Robert Bellow, MD;  Location: [facility];  Service: General;  Laterality: Right;  . ROBOTIC ADRENALECTOMY Right 12/28/2016   Procedure: ROBOTIC ADRENALECTOMY;  Surgeon: Hollice Espy, MD;  Location: [facility] ORS;  Service: Urology;  Laterality: Right;  . SKIN CANCER EXCISION    . THROAT SURGERY  1998   throat cancer   . TUBAL LIGATION    . VENTRAL HERNIA REPAIR N/A 03/03/2017   Procedure: HERNIA REPAIR VENTRAL ADULT;  Surgeon: Clayburn Pert, MD;  Location: [facility] ORS;  Service: General;  Laterality: N/A;    Family History  Problem Relation Age of Onset   . Diabetes Mother   . Heart attack Mother   . Cancer Sister        cancer of eyes, spread to lung, other places. died at 65  . Cancer Sister 87       unknown primary, spread to bone, brain died in 28's  . Cancer Paternal Aunt        unk type  . Cancer Paternal Grandmother        type unk  . Prostate cancer Neg Hx   . Kidney cancer Neg Hx   . Bladder Cancer Neg Hx     Social History:  reports that she quit smoking about 23 years ago. Her smoking use included cigarettes. She has a 45.00 pack-year smoking history. She has never used smokeless tobacco. She reports current alcohol use. She reports that she does not use drugs.  Allergies: No Known Allergies  Medications reviewed.    ROS Full ROS performed and is otherwise negative other than what is stated in HPI   BP 122/82   Pulse 97   Temp 98.4 F (36.9 C) (Oral)   Ht 5' 1"  (1.549 m)   Wt 131 lb 3.2 oz (59.5 kg)   SpO2 98%   BMI 24.79 kg/m   Physical Exam Vitals and nursing note reviewed. Exam conducted with a chaperone present.  Constitutional:      General: She is not in acute distress.    Appearance: Normal appearance. She is normal weight.  Neck:     Comments: Evidence of radiation skin changes on the neck w/o  Cardiovascular:     Rate and Rhythm: Normal rate and regular rhythm.  Pulmonary:     Effort: Pulmonary effort is normal. No respiratory distress.     Breath sounds: No stridor.  Abdominal:     General: Abdomen is flat. There is no distension.     Palpations: Abdomen is soft. There is no mass.     Tenderness: There is abdominal tenderness. There is no rebound.     Hernia: No hernia is present.  Musculoskeletal:        General: No swelling or tenderness. Normal range of motion.     Cervical back: Normal range of motion and neck supple. No rigidity or tenderness.  Skin:    General: Skin is warm and dry.     Capillary Refill: Capillary refill takes less than 2 seconds.  Neurological:     General: No  focal deficit present.     Mental Status: She is alert and oriented to person, place, and time.  Psychiatric:        Mood and Affect: Mood normal.        Behavior: Behavior normal.        Thought Content: Thought content normal.        Judgment: Judgment normal.    Assessment/Plan: 64 year old female with multiple issues now most pressing one is right lower quadrant pain that is persistent.  I am concerned about a potential acute intra-abdominal process.  I will  order a stat CT of the abdomen and pelvis as well as a CBC and a CMP.  I will have a short follow-up.  Regarding her history of breast cancer I do not see a reason for any further intervention only yearly mammograms. Extensive counseling provided Greater than 50% of the 40 minutes  visit was spent in counseling/coordination of care   Caroleen Hamman, MD [place] Surgeon

## 2020-01-08 ENCOUNTER — Other Ambulatory Visit: Payer: Self-pay

## 2020-01-08 ENCOUNTER — Ambulatory Visit (INDEPENDENT_AMBULATORY_CARE_PROVIDER_SITE_OTHER): Payer: Medicare HMO | Admitting: Surgery

## 2020-01-08 ENCOUNTER — Encounter: Payer: Self-pay | Admitting: Surgery

## 2020-01-08 ENCOUNTER — Ambulatory Visit: Payer: Medicare HMO

## 2020-01-08 VITALS — BP 113/76 | HR 111 | Temp 97.7°F | Ht 61.0 in | Wt 133.0 lb

## 2020-01-08 DIAGNOSIS — R1031 Right lower quadrant pain: Secondary | ICD-10-CM

## 2020-01-08 MED ORDER — CYCLOBENZAPRINE HCL 5 MG PO TABS: 5.0000 mg | ORAL_TABLET | Freq: Three times a day (TID) | ORAL | 1 refills | Status: DC | PRN

## 2020-01-08 MED ORDER — GABAPENTIN 300 MG PO CAPS: 300.0000 mg | ORAL_CAPSULE | Freq: Three times a day (TID) | ORAL | 1 refills | Status: DC

## 2020-01-08 NOTE — Patient Instructions (Addendum)
Dr.Pabon suggested patient to follow up in three weeks with ultrasound prior to appointment.  Patient is scheduled for Ultrasound on September 15 th at 8:45a with arrival at 8:30a.  Patient instructions: Patient not to eat or drink 6 hours prior to ultrasound.  Abdominal Pain, Adult Many things can cause belly (abdominal) pain. Most times, belly pain is not dangerous. Many cases of belly pain can be watched and treated at home. Sometimes, though, belly pain is serious. Your doctor will try to find the cause of your belly pain. Follow these instructions at home:  Medicines  Take over-the-counter and prescription medicines only as told by your doctor.  Do not take medicines that help you poop (laxatives) unless told by your doctor. General instructions  Watch your belly pain for any changes.  Drink enough fluid to keep your pee (urine) pale yellow.  Keep all follow-up visits as told by your doctor. This is important. Contact a doctor if:  Your belly pain changes or gets worse.  You are not hungry, or you lose weight without trying.  You are having trouble pooping (constipated) or have watery poop (diarrhea) for more than 2-3 days.  You have pain when you pee or poop.  Your belly pain wakes you up at night.  Your pain gets worse with meals, after eating, or with certain foods.  You are vomiting and cannot keep anything down.  You have a fever.  You have blood in your pee. Get help right away if:  Your pain does not go away as soon as your doctor says it should.  You cannot stop vomiting.  Your pain is only in areas of your belly, such as the right side or the left lower part of the belly.  You have bloody or black poop, or poop that looks like tar.  You have very bad pain, cramping, or bloating in your belly.  You have signs of not having enough fluid or water in your body (dehydration), such as: ? Dark pee, very little pee, or no pee. ? Cracked lips. ? Dry  mouth. ? Sunken eyes. ? Sleepiness. ? Weakness.  You have trouble breathing or chest pain. Summary  Many cases of belly pain can be watched and treated at home.  Watch your belly pain for any changes.  Take over-the-counter and prescription medicines only as told by your doctor.  Contact a doctor if your belly pain changes or gets worse.  Get help right away if you have very bad pain, cramping, or bloating in your belly. This information is not intended to replace advice given to you by your health care provider. Make sure you discuss any questions you have with your health care provider. Document Revised: 08/27/2018 Document Reviewed: 08/27/2018 Elsevier Patient Education  [place].

## 2020-01-09 NOTE — Progress Notes (Signed)
Outpatient Surgical Follow Up  01/09/2020  Denise Watts is an 64 y.o. female.   Chief Complaint  Patient presents with  . Follow-up    Follow up - abdominal pain and tenderness, CT abdomen pelvis f/u - Per Dr.Cloteal Isaacson    HPI: Denise Watts is a 64 year old female well-known to me with history of abdominal pain that is subacute now the pain is localized more to the right upper quadrant.  CT scan personally reviewed noting no evidence of any acute intra-abdominal disease.  She also describes the pain is intermittent moderate in intensity sharp in nature and also radiates to the right chest wall.  It worsens with deep inspiration.  He still able to eat and have bowel movements.  No fevers no chills. Lab work reviewed and includes a CBC and a CMP is completely normal.    Past Medical History:  Diagnosis Date  . Anxiety   . Arthritis    KNEE RIGHT  . Breast cancer [facility]) 1997   right breast lumpectomy with radiation  . Breast cancer ([place]) 11/15/2017   INVASIVE MAMMARY CARCINOMA/ triple negative  . 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[place]) 11/10/2014  . Cough 03/02/2017   INTERMTTENT DRY COUGH  . Depression   . GERD (gastroesophageal reflux disease)   . History of hiatal hernia   . Personal history of chemotherapy   . Personal history of radiation therapy   . Skin cancer   . Throat cancer [facility]) 1998   radiation    Past Surgical History:  Procedure Laterality Date  . BREAST BIOPSY Right 1997   positive  . BREAST BIOPSY Right 11/15/2017   INVASIVE MAMMARY CARCINOMA triple negative  . BREAST LUMPECTOMY Right 1997   2019 also  . BREAST LUMPECTOMY WITH SENTINEL LYMPH NODE BIOPSY Right 12/08/2017   Procedure: BREAST LUMPECTOMY WITH SENTINEL LYMPH NODE BX;  Surgeon: Robert Bellow, MD;  Location: [facility] ORS;  Service: General;  Laterality: Right;  . BREAST REDUCTION WITH MASTOPEXY Left 06/10/2019   Procedure: Left breast mastopexy reduction for symmetry;  Surgeon: Wallace Going, DO;  Location: [place];  Service: Plastics;  Laterality: Left;  total 2.5 hours  . COLONOSCOPY  2015  . COLONOSCOPY WITH PROPOFOL N/A 10/29/2019   Procedure: COLONOSCOPY WITH PROPOFOL;  Surgeon: Lucilla Lame, MD;  Location: [facility] ENDOSCOPY;  Service: Endoscopy;  Laterality: N/A;  . ESOPHAGOGASTRODUODENOSCOPY (EGD) WITH PROPOFOL N/A 05/24/2019   Procedure: ESOPHAGOGASTRODUODENOSCOPY (EGD) WITH PROPOFOL;  Surgeon: Lucilla Lame, MD;  Location: [name] ENDOSCOPY;  Service: Endoscopy;  Laterality: N/A;  . ESOPHAGUS SURGERY    . KNEE SURGERY Right   . LIPOSUCTION WITH LIPOFILLING Right 06/10/2019   Procedure: right breast scar and capsule release with fat grafting;  Surgeon: Wallace Going, DO;  Location: [place];  Service: Plastics;  Laterality: Right;  . PORT-A-CATH REMOVAL    . PORTACATH PLACEMENT Right 01/17/2018   Procedure: INSERTION PORT-A-CATH- RIGHT;  Surgeon: Robert Bellow, MD;  Location: [facility];  Service: General;  Laterality: Right;  . ROBOTIC ADRENALECTOMY Right 12/28/2016   Procedure: ROBOTIC ADRENALECTOMY;  Surgeon: Hollice Espy, MD;  Location: [facility] ORS;  Service: Urology;  Laterality: Right;  . SKIN CANCER EXCISION    . THROAT SURGERY  1998   throat cancer   . TUBAL LIGATION    . VENTRAL HERNIA REPAIR N/A 03/03/2017   Procedure: HERNIA REPAIR VENTRAL ADULT;  Surgeon: Clayburn Pert, MD;  Location: [facility] ORS;  Service: General;  Laterality: N/A;  Family History  Problem Relation Age of Onset  . Diabetes Mother   . Heart attack Mother   . Cancer Sister        cancer of eyes, spread to lung, other places. died at 28  . Cancer Sister 33       unknown primary, spread to bone, brain died in 57's  . Cancer Paternal Aunt        unk type  . Cancer Paternal Grandmother        type unk  . Prostate cancer Neg Hx   . Kidney cancer Neg Hx   . Bladder Cancer Neg Hx     Social History:  reports that she quit smoking about 23 years ago. Her smoking use included  cigarettes. She has a 45.00 pack-year smoking history. She has never used smokeless tobacco. She reports current alcohol use. She reports that she does not use drugs.  Allergies: No Known Allergies  Medications reviewed.    ROS Full ROS performed and is otherwise negative other than what is stated in HPI   BP 113/76   Pulse (!) 111   Temp 97.7 F (36.5 C) (Oral)   Ht 5\' 1"  (1.549 m)   Wt 133 lb (60.3 kg)   SpO2 97%   BMI 25.13 kg/m   Physical Exam Vitals and nursing note reviewed. Exam conducted with a chaperone present.  Constitutional:      General: She is not in acute distress.    Appearance: Normal appearance. She is normal weight.  Eyes:     General: No scleral icterus.       Right eye: No discharge.        Left eye: No discharge.  Pulmonary:     Effort: Pulmonary effort is normal. No respiratory distress.     Breath sounds: No stridor.     Comments: Right chest wall tenderness. Abdominal:     General: Abdomen is flat. Bowel sounds are normal. There is no distension.     Palpations: Abdomen is soft. There is no mass.     Tenderness: There is abdominal tenderness. There is no guarding or rebound.     Hernia: No hernia is present.  Musculoskeletal:        General: No swelling or tenderness. Normal range of motion.     Cervical back: Normal range of motion and neck supple. No rigidity or tenderness.  Skin:    General: Skin is warm and dry.     Capillary Refill: Capillary refill takes less than 2 seconds.  Neurological:     General: No focal deficit present.     Mental Status: She is alert and oriented to person, place, and time.  Psychiatric:        Mood and Affect: Mood normal.        Behavior: Behavior normal.        Thought Content: Thought content normal.        Judgment: Judgment normal.       Assessment/Plan: Persistent right upper quadrant pain and right chest wall tenderness.  Now the pain currently is declaring more towards musculoskeletal of the  chest wall and differentials will include intercostal nerve neuralgia vs muscle spasms. I  Discussed with patient detail and she is willing to try gabapentin as well as NSAID trial along with Flexeril.  Do think that there is a component of muscle spasms within the abdominal wall that is causing some of her symptoms.  Also noticed that she has  not had any ultrasound of the right upper quadrant to exclude any biliary disease.  I will go ahead and order a right upper quadrant ultrasound for completeness purposes.  I will see her in a few weeks and follow her clinical condition.  No need for surgical intervention at this time    Greater than 50% of the 40 minutes  visit was spent in counseling/coordination of care   Caroleen Hamman, MD [place] Surgeon

## 2020-01-10 ENCOUNTER — Inpatient Hospital Stay: Payer: Medicare HMO | Attending: Oncology

## 2020-01-10 ENCOUNTER — Other Ambulatory Visit: Payer: Self-pay

## 2020-01-10 DIAGNOSIS — E538 Deficiency of other specified B group vitamins: Secondary | ICD-10-CM

## 2020-01-10 DIAGNOSIS — Z23 Encounter for immunization: Secondary | ICD-10-CM

## 2020-01-10 MED ORDER — CYANOCOBALAMIN 1000 MCG/ML IJ SOLN
1000.0000 ug | INTRAMUSCULAR | Status: DC
  Administered 2020-01-10: 1000 ug via INTRAMUSCULAR
  Filled 2020-01-10: qty 1

## 2020-01-13 ENCOUNTER — Other Ambulatory Visit: Payer: Medicare HMO

## 2020-01-13 DIAGNOSIS — C44521 Squamous cell carcinoma of skin of breast: Secondary | ICD-10-CM | POA: Diagnosis not present

## 2020-01-15 ENCOUNTER — Ambulatory Visit
Admission: RE | Admit: 2020-01-15 | Discharge: 2020-01-15 | Disposition: A | Discharge status: Home / Self Care | Payer: Medicare HMO | Source: Ambulatory Visit | Attending: Surgery | Admitting: Surgery

## 2020-01-15 ENCOUNTER — Other Ambulatory Visit: Payer: Self-pay

## 2020-01-15 ENCOUNTER — Other Ambulatory Visit: Payer: Self-pay | Admitting: Surgery

## 2020-01-15 DIAGNOSIS — R1011 Right upper quadrant pain: Secondary | ICD-10-CM

## 2020-01-15 DIAGNOSIS — K828 Other specified diseases of gallbladder: Secondary | ICD-10-CM | POA: Diagnosis not present

## 2020-01-15 DIAGNOSIS — R1031 Right lower quadrant pain: Secondary | ICD-10-CM | POA: Insufficient documentation

## 2020-01-16 ENCOUNTER — Telehealth: Payer: Self-pay

## 2020-01-16 NOTE — Telephone Encounter (Signed)
Patient notified per Dr Dahlia Byes she does have sludge in her gallbladder. She may come in early on Monday if she chooses. She will call back about this.

## 2020-01-20 ENCOUNTER — Encounter: Payer: Self-pay | Admitting: Surgery

## 2020-01-20 ENCOUNTER — Other Ambulatory Visit: Payer: Self-pay

## 2020-01-20 ENCOUNTER — Emergency Department
Admission: EM | Admit: 2020-01-20 | Discharge: 2020-01-20 | Disposition: A | Discharge status: Home / Self Care | Payer: Medicare HMO | Attending: Emergency Medicine | Admitting: Emergency Medicine

## 2020-01-20 ENCOUNTER — Encounter: Payer: Self-pay | Admitting: Emergency Medicine

## 2020-01-20 ENCOUNTER — Ambulatory Visit (INDEPENDENT_AMBULATORY_CARE_PROVIDER_SITE_OTHER): Payer: Medicare HMO | Admitting: Surgery

## 2020-01-20 VITALS — BP 102/72 | HR 105 | Temp 98.0°F | Ht 61.0 in | Wt 136.0 lb

## 2020-01-20 DIAGNOSIS — R1010 Upper abdominal pain, unspecified: Secondary | ICD-10-CM | POA: Diagnosis not present

## 2020-01-20 DIAGNOSIS — Z853 Personal history of malignant neoplasm of breast: Secondary | ICD-10-CM | POA: Insufficient documentation

## 2020-01-20 DIAGNOSIS — R1011 Right upper quadrant pain: Secondary | ICD-10-CM | POA: Diagnosis not present

## 2020-01-20 DIAGNOSIS — G588 Other specified mononeuropathies: Secondary | ICD-10-CM | POA: Diagnosis not present

## 2020-01-20 DIAGNOSIS — Z8521 Personal history of malignant neoplasm of larynx: Secondary | ICD-10-CM | POA: Diagnosis not present

## 2020-01-20 DIAGNOSIS — R109 Unspecified abdominal pain: Secondary | ICD-10-CM

## 2020-01-20 DIAGNOSIS — Z20822 Contact with and (suspected) exposure to covid-19: Secondary | ICD-10-CM | POA: Insufficient documentation

## 2020-01-20 DIAGNOSIS — Z87891 Personal history of nicotine dependence: Secondary | ICD-10-CM | POA: Diagnosis not present

## 2020-01-20 DIAGNOSIS — R1031 Right lower quadrant pain: Secondary | ICD-10-CM

## 2020-01-20 DIAGNOSIS — G580 Intercostal neuropathy: Secondary | ICD-10-CM

## 2020-01-20 DIAGNOSIS — Z03818 Encounter for observation for suspected exposure to other biological agents ruled out: Secondary | ICD-10-CM | POA: Diagnosis not present

## 2020-01-20 DIAGNOSIS — Z85828 Personal history of other malignant neoplasm of skin: Secondary | ICD-10-CM | POA: Diagnosis not present

## 2020-01-20 LAB — CBC
HCT: 40.5 % (ref 36.0–46.0)
Hemoglobin: 13.4 g/dL (ref 12.0–15.0)
MCH: 33.3 pg (ref 26.0–34.0)
MCHC: 33.1 g/dL (ref 30.0–36.0)
MCV: 100.7 fL — ABNORMAL HIGH (ref 80.0–100.0)
Platelets: 180 10*3/uL (ref 150–400)
RBC: 4.02 MIL/uL (ref 3.87–5.11)
RDW: 12.2 % (ref 11.5–15.5)
WBC: 4.1 10*3/uL (ref 4.0–10.5)
nRBC: 0 % (ref 0.0–0.2)

## 2020-01-20 LAB — COMPREHENSIVE METABOLIC PANEL
ALT: 17 U/L (ref 0–44)
AST: 23 U/L (ref 15–41)
Albumin: 3.9 g/dL (ref 3.5–5.0)
Alkaline Phosphatase: 68 U/L (ref 38–126)
Anion gap: 8 (ref 5–15)
BUN: 9 mg/dL (ref 8–23)
CO2: 28 mmol/L (ref 22–32)
Calcium: 9 mg/dL (ref 8.9–10.3)
Chloride: 102 mmol/L (ref 98–111)
Creatinine, Ser: 0.97 mg/dL (ref 0.44–1.00)
GFR calc Af Amer: >60 mL/min (ref >60)
GFR calc non Af Amer: >60 mL/min (ref >60)
Glucose, Bld: 127 mg/dL — ABNORMAL HIGH (ref 70–99)
Potassium: 4.3 mmol/L (ref 3.5–5.1)
Sodium: 138 mmol/L (ref 135–145)
Total Bilirubin: 0.6 mg/dL (ref 0.3–1.2)
Total Protein: 6.9 g/dL (ref 6.5–8.1)

## 2020-01-20 LAB — SARS CORONAVIRUS 2 BY RT PCR (HOSPITAL ORDER, PERFORMED IN ~~LOC~~ HOSPITAL LAB): SARS Coronavirus 2: NEGATIVE

## 2020-01-20 LAB — URINALYSIS, COMPLETE (UACMP) WITH MICROSCOPIC
Bacteria, UA: NONE SEEN
Bilirubin Urine: NEGATIVE
Glucose, UA: NEGATIVE mg/dL
Hgb urine dipstick: NEGATIVE
Ketones, ur: NEGATIVE mg/dL
Leukocytes,Ua: NEGATIVE
Nitrite: NEGATIVE
Protein, ur: NEGATIVE mg/dL
Specific Gravity, Urine: 1.006 (ref 1.005–1.030)
Squamous Epithelial / HPF: NONE SEEN (ref 0–5)
pH: 6 (ref 5.0–8.0)

## 2020-01-20 LAB — LACTIC ACID, PLASMA: Lactic Acid, Venous: 1.2 mmol/L (ref 0.5–1.9)

## 2020-01-20 LAB — LIPASE, BLOOD: Lipase: 23 U/L (ref 11–51)

## 2020-01-20 MED ORDER — OXYCODONE HCL 5 MG PO TABS: 5.0000 mg | ORAL_TABLET | Freq: Three times a day (TID) | ORAL | 0 refills | Status: DC | PRN

## 2020-01-20 MED ORDER — HYDROMORPHONE HCL 1 MG/ML IJ SOLN
1.0000 mg | Freq: Once | INTRAMUSCULAR | Status: AC
  Administered 2020-01-20: 1 mg via INTRAMUSCULAR
  Filled 2020-01-20: qty 1

## 2020-01-20 MED ORDER — TRIAMCINOLONE ACETONIDE 40 MG/ML IJ SUSP
40.0000 mg | Freq: Once | INTRAMUSCULAR | Status: AC
  Administered 2020-01-20: 40 mg via INTRAMUSCULAR

## 2020-01-20 MED ORDER — OXYCODONE HCL 5 MG PO TABS
5.0000 mg | ORAL_TABLET | Freq: Once | ORAL | Status: AC
  Administered 2020-01-20: 5 mg via ORAL
  Filled 2020-01-20: qty 1

## 2020-01-20 NOTE — ED Notes (Signed)
Pt requested pain medication before leaving. Provider notified and placed order for oxycodone. Pt states she has taken it before. Pt states husband is driving her home.

## 2020-01-20 NOTE — ED Triage Notes (Signed)
Patient to ER for c/o abd pain to epigastric region and RUQ. Patient reports intermittent fever and cough. Reports recent surgery, but unsure of what surgery was or when.

## 2020-01-20 NOTE — Progress Notes (Signed)
Outpatient Surgical Follow Up  01/20/2020  Denise Watts is an 64 y.o. female.   Chief Complaint  Patient presents with  . Follow-up    Follow up - abdominal pain and tenderness, CT abdomen pelvis f/u, ultrasound RUQ pain - Killian Schwer    HPI: Denise Watts is a 64 year old female well-known to me with history of abdominal pain that is subacute now the pain is localized more to the right upper quadrant and chest wall.  CT scan personally reviewed noting no evidence of any acute intra-abdominal disease.  She also describes the pain is intermittent moderate in intensity sharp in nature and also radiates to the right chest wall.  It worsens with deep inspiration.  SHe still able to eat and have bowel movements.  No fevers no chills.  He had another attack early this morning.  Also order an ultrasound of the right upper quadrant that I have personally reviewed showing only some sludge but no evidence of cholecystitis.  Lab work reviewed and includes a CBC and a CMP is completely normal. SHe has started on Flexeril and gabapentin with very mild improvement   Past Medical History:  Diagnosis Date  . Anxiety   . Arthritis    KNEE RIGHT  . Breast cancer [facility]) 1997   right breast lumpectomy with radiation  . Breast cancer ([place]) 11/15/2017   INVASIVE MAMMARY CARCINOMA/ triple negative  . 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 Cough 03/02/2017   INTERMTTENT DRY COUGH  . Depression   . GERD (gastroesophageal reflux disease)   . History of hiatal hernia   . Personal history of chemotherapy   . Personal history of radiation therapy   . Skin cancer   . Throat cancer [facility]) 1998   radiation    Past Surgical History:  Procedure Laterality Date  . BREAST BIOPSY Right 1997   positive  . BREAST BIOPSY Right 11/15/2017   INVASIVE MAMMARY CARCINOMA triple negative  . BREAST LUMPECTOMY Right 1997   2019 also  . BREAST LUMPECTOMY WITH SENTINEL LYMPH NODE BIOPSY Right 12/08/2017   Procedure: BREAST LUMPECTOMY  WITH SENTINEL LYMPH NODE BX;  Surgeon: Robert Bellow, MD;  Location: [facility] ORS;  Service: General;  Laterality: Right;  . BREAST REDUCTION WITH MASTOPEXY Left 06/10/2019   Procedure: Left breast mastopexy reduction for symmetry;  Surgeon: Wallace Going, DO;  Location: [place];  Service: Plastics;  Laterality: Left;  total 2.5 hours  . COLONOSCOPY  2015  . COLONOSCOPY WITH PROPOFOL N/A 10/29/2019   Procedure: COLONOSCOPY WITH PROPOFOL;  Surgeon: Lucilla Lame, MD;  Location[facility] ENDOSCOPY;  Service: Endoscopy;  Laterality: N/A;  . ESOPHAGOGASTRODUODENOSCOPY (EGD) WITH PROPOFOL N/A 05/24/2019   Procedure: ESOPHAGOGASTRODUODENOSCOPY (EGD) WITH PROPOFOL;  Surgeon: Lucilla Lame, MD;  Location[facility]COPY;  Service: Endoscopy;  Laterality: N/A;  . ESOPHAGUS SURGERY    . KNEE SURGERY Right   . LIPOSUCTION WITH LIPOFILLING Right 06/10/2019   Procedure: right breast scar and capsule release with fat grafting;  Surgeon: Wallace Going, DO;  Location: [place];  Service: Plastics;  Laterality: Right;  . PORT-A-CATH REMOVAL    . PORTACATH PLACEMENT Right 01/17/2018   Procedure: INSERTION PORT-A-CATH- RIGHT;  Surgeon: Robert Bellow, MD;  Location: [facility] ORS;  Service: General;  Laterality: Right;  . ROBOTIC ADRENALECTOMY Right 12/28/2016   Procedure: ROBOTIC ADRENALECTOMY;  Surgeon: Hollice Espy, MD;  Location: [facility] ORS;  Service: Urology;  Laterality: Right;  . SKIN CANCER EXCISION    .  THROAT SURGERY  1998   throat cancer   . TUBAL LIGATION    . VENTRAL HERNIA REPAIR N/A 03/03/2017   Procedure: HERNIA REPAIR VENTRAL ADULT;  Surgeon: Clayburn Pert, MD;  Location: [facility] ORS;  Service: General;  Laterality: N/A;    Family History  Problem Relation Age of Onset  . Diabetes Mother   . Heart attack Mother   . Cancer Sister        cancer of eyes, spread to lung, other places. died at 76  . Cancer Sister 38       unknown primary, spread to bone,  brain died in 42's  . Cancer Paternal Aunt        unk type  . Cancer Paternal Grandmother        type unk  . Prostate cancer Neg Hx   . Kidney cancer Neg Hx   . Bladder Cancer Neg Hx     Social History:  reports that she quit smoking about 23 years ago. Her smoking use included cigarettes. She has a 45.00 pack-year smoking history. She has never used smokeless tobacco. She reports current alcohol use. She reports that she does not use drugs.  Allergies: No Known Allergies  Medications reviewed.    ROS Full ROS performed and is otherwise negative other than what is stated in HPI   BP 102/72   Pulse (!) 105   Temp 98 F (36.7 C) (Oral)   Ht 5\' 1"  (1.549 m)   Wt 136 lb (61.7 kg)   SpO2 95%   BMI 25.70 kg/m   Physical Exam Vitals and nursing note reviewed. Exam conducted with a chaperone present.  Constitutional:      Appearance: Normal appearance.  Eyes:     General: No scleral icterus.       Right eye: No discharge.        Left eye: No discharge.  Cardiovascular:     Rate and Rhythm: Normal rate and regular rhythm.     Heart sounds: No murmur heard.   Pulmonary:     Effort: Pulmonary effort is normal. No respiratory distress.     Breath sounds: Normal breath sounds. No stridor.     Comments: Visit right-sided chest wall tenderness.. His exam shows no evidence of infection no evidence of mastitis on the right side.  Changes related to lumpectomy, radiation changes as well as reconstruction. Abdominal:     General: Abdomen is flat. There is no distension.     Palpations: There is no mass.     Tenderness: There is no abdominal tenderness. There is no rebound.     Hernia: No hernia is present.  Musculoskeletal:        General: No swelling or tenderness. Normal range of motion.     Cervical back: Normal range of motion and neck supple.  Skin:    General: Skin is warm and dry.     Capillary Refill: Capillary refill takes less than 2 seconds.  Neurological:      General: No focal deficit present.     Mental Status: She is alert and oriented to person, place, and time.  Psychiatric:        Mood and Affect: Mood normal.        Behavior: Behavior normal.        Thought Content: Thought content normal.        Judgment: Judgment normal.         Assessment/Plan:  Denise Watts is a 64 year old female  with history of breast cancer and radiation therapy that is post lumpectomy in 2019.  Now comes with chronic right upper quadrant and right chest wall pain.  This is more consistent with musculoskeletal in nature either intercostal neuralgia or chest wall neuropathy from radiation therapy.  Discussed with the patient in detail we could offer an intercostal nerve block on the right side.  Procedure discussed with patient in detail.  Risk benefit and possible complications    Procedure note Intercostal nerve block of 5 6 and 7 intercostal nerves on the right side  Anesthesia: lidocaine 1% w epi and marcaine .25% 1:1 ratio. total 20cc + kenalog 40 mg  Complications: none  Informed consent was obtained the patient was placed in supine position and the right chest wall was prepped and draped in the usual sterile fashion.  We identified the fifth sixth and seventh ribs and under direct palpation I was able to infiltrate the skin subcutaneous tissue and the actual bone.  I performed a 3 intercostal nerve block in the standard fashion.  There was some relief of pain but the patient overall stated that she had persistent severe pain in her right chest wall.  Complications. Given persistent symptoms I encouraged her to go to the emergency room.  Patient daughter was called and she will take her to the emergency room for appropriate pain control with IV medications.    Greater than 50% of the 40 minutes  visit was spent in counseling/coordination of care   Caroleen Hamman, MD [place] Surgeon

## 2020-01-20 NOTE — ED Provider Notes (Signed)
[facility] Emergency Department Provider Note  Time seen: 6:24 PM  I have reviewed the triage vital signs and the nursing notes.   HISTORY  Chief Complaint Abdominal pain   HPI Denise Watts is a 64 y.o. female with a past medical history anxiety, depression, gastric reflux, presents to the emergency department for continued upper abdominal pain.  According to the patient for the past month or so she has been experiencing abdominal pain.  Patient has followed up with her doctor as well as Dr. Dahlia Byes of surgery.  Per patient and record review patient has had a CT scan done approximately 3 weeks ago, right upper quadrant ultrasound performed less than a week ago, all with negative results thus far.  Patient states the pain was initially in the lower abdomen but now has progressed more to the right upper quadrant, which is why she had the right upper quadrant ultrasound performed 5 days ago.  Past Medical History:  Diagnosis Date  . Anxiety   . Arthritis    KNEE RIGHT  . Breast cancer [facility]) 1997   right breast lumpectomy with radiation  . Breast cancer ([place]) 11/15/2017   INVASIVE MAMMARY CARCINOMA/ triple negative  . 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 Cough 03/02/2017   INTERMTTENT DRY COUGH  . Depression   . GERD (gastroesophageal reflux disease)   . History of hiatal hernia   . Personal history of chemotherapy   . Personal history of radiation therapy   . Skin cancer   . Throat cancer [facility]) 1998   radiation    Patient Active Problem List   Diagnosis Date Noted  . Encounter for screening colonoscopy   . Polyp of transverse colon   . Dysphagia   . Stricture and stenosis of esophagus   . Cervical spondylosis 03/18/2019  . 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 Need for prophylactic vaccination and inoculation against influenza 01/25/2019  . Postoperative breast asymmetry 01/04/2019  . Status post partial mastectomy 01/04/2019  . Vitamin D  deficiency 12/12/2018  . Bursitis of left shoulder 11/21/2018  . Rotator cuff tendinitis, left 11/21/2018  . Genetic testing 02/01/2018  . Skin cancer 01/18/2018  . Goals of care, counseling/discussion 01/12/2018  . B12 deficiency 12/28/2017  . Malignant neoplasm of lower-outer quadrant of right breast of female, estrogen receptor negative ([place]) 11/24/2017  . Chronic abdominal pain (Primary Area of Pain)  04/03/2017  . Chronic low back pain (Secondary Area of Pain) (B) (R>L) 04/03/2017  . Chronic knee pain [facility] Area of Pain) (R) 04/03/2017  . Chronic pain syndrome 04/03/2017  . Disorder of bone, unspecified 04/03/2017  . Other specified health status 04/03/2017  . Other long term (current) drug therapy 04/03/2017  . Long term current use of opiate analgesic 04/03/2017  . Chronic sacroiliac joint pain 04/03/2017  . S/P repair of ventral hernia 03/03/2017  . Incisional hernia, without obstruction or gangrene 02/13/2017  . Right adrenal mass ([place]) 12/28/2016  . RIGHT Adrenal Mass 12/16/2016  . Fatigue 02/17/2016  . Other fatigue 02/17/2016  . Multiple lung nodules on CT 02/17/2016  . History of right breast cancer 02/17/2016  . Peripheral neuropathy 02/17/2016  . Closed fracture of a rib 02/02/2015  . History of fundoplication 42/70/6237  . History of throat cancer 01/15/2015  . Acid reflux 01/15/2015  . Gonalgia 01/15/2015  . Closed fracture of pubis ([place]) 01/15/2015  . Pelvic fracture ([place]) 01/12/2015  . 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[address] [address]) 11/10/2014  . Herpes  simplex virus (HSV) epithelial keratitis 03/06/2013  . Nuclear sclerotic cataract 03/06/2013  . Onychia of finger 10/23/2008  . Odontogenic tumor 03/13/2008  . Tobacco use 06/14/2007    Past Surgical History:  Procedure Laterality Date  . BREAST BIOPSY Right 1997   positive  . BREAST BIOPSY Right 11/15/2017   INVASIVE MAMMARY CARCINOMA triple negative  . BREAST LUMPECTOMY Right 1997   2019 also  . BREAST LUMPECTOMY  WITH SENTINEL LYMPH NODE BIOPSY Right 12/08/2017   Procedure: BREAST LUMPECTOMY WITH SENTINEL LYMPH NODE BX;  Surgeon: Robert Bellow, MD;  Location: [facility] ORS;  Service: General;  Laterality: Right;  . BREAST REDUCTION WITH MASTOPEXY Left 06/10/2019   Procedure: Left breast mastopexy reduction for symmetry;  Surgeon: Wallace Going, DO;  Location: [place];  Service: Plastics;  Laterality: Left;  total 2.5 hours  . COLONOSCOPY  2015  . COLONOSCOPY WITH PROPOFOL N/A 10/29/2019   Procedure: COLONOSCOPY WITH PROPOFOL;  Surgeon: Lucilla Lame, MD;  Location: [facility] ENDOSCOPY;  Service: Endoscopy;  Laterality: N/A;  . ESOPHAGOGASTRODUODENOSCOPY (EGD) WITH PROPOFOL N/A 05/24/2019   Procedure: ESOPHAGOGASTRODUODENOSCOPY (EGD) WITH PROPOFOL;  Surgeon: Lucilla Lame, MD;  Location: [facility] ENDOSCOPY;  Service: Endoscopy;  Laterality: N/A;  . ESOPHAGUS SURGERY    . KNEE SURGERY Right   . LIPOSUCTION WITH LIPOFILLING Right 06/10/2019   Procedure: right breast scar and capsule release with fat grafting;  Surgeon: Wallace Going, DO;  Location: [place];  Service: Plastics;  Laterality: Right;  . PORT-A-CATH REMOVAL    . PORTACATH PLACEMENT Right 01/17/2018   Procedure: INSERTION PORT-A-CATH- RIGHT;  Surgeon: Robert Bellow, MD;  Location: [facility] ORS;  Service: General;  Laterality: Right;  . ROBOTIC ADRENALECTOMY Right 12/28/2016   Procedure: ROBOTIC ADRENALECTOMY;  Surgeon: Hollice Espy, MD;  Location: [facility] ORS;  Service: Urology;  Laterality: Right;  . SKIN CANCER EXCISION    . THROAT SURGERY  1998   throat cancer   . TUBAL LIGATION    . VENTRAL HERNIA REPAIR N/A 03/03/2017   Procedure: HERNIA REPAIR VENTRAL ADULT;  Surgeon: Clayburn Pert, MD;  Location: [facility] ORS;  Service: General;  Laterality: N/A;    Prior to Admission medications   Medication Sig Start Date End Date Taking? Authorizing Provider  cetirizine (ZYRTEC) 10 MG tablet Take 10 mg by mouth daily.     [provider]  Cyanocobalamin (B-12 COMPLIANCE INJECTION IJ) Inject 1 Dose as directed every 30 (thirty) days.    [provider]  cyclobenzaprine (FLEXERIL) 5 MG tablet Take 1 tablet (5 mg total) by mouth 3 (three) times daily as needed for muscle spasms. 01/08/20   Pabon, Diego F, MD  DULoxetine (CYMBALTA) 60 MG capsule Take 1 capsule by mouth daily. 08/12/19 08/11/20  [provider]  gabapentin (NEURONTIN) 300 MG capsule Take 1 capsule (300 mg total) by mouth 3 (three) times daily. 01/08/20   Pabon, Diego F, MD  omeprazole (PRILOSEC) 40 MG capsule TAKE 1 CAPSULE BY MOUTH EVERY DAY (MORNING) 10/26/19   Chrismon, Vickki Muff, PA  ondansetron (ZOFRAN) 4 MG tablet Take 1 tablet (4 mg total) by mouth every 8 (eight) hours as needed for nausea or vomiting. 06/06/19   Scheeler, Carola Rhine, PA-C  phenazopyridine (PYRIDIUM) 200 MG tablet Take 1 tablet (200 mg total) by mouth 3 (three) times daily as needed for pain. 10/03/19   Chrismon, Vickki Muff, PA  polyethylene glycol-electrolytes (NULYTELY) 420 g solution Take 4,000 mLs by mouth as directed.  10/26/19  [provider]  rOPINIRole (REQUIP) 0.25 MG tablet Take by mouth. 10/03/19 10/02/20  [provider]    No Known Allergies  Family History  Problem Relation Age of Onset  . Diabetes Mother   . Heart attack Mother   . Cancer Sister        cancer of eyes, spread to lung, other places. died at 61  . Cancer Sister 93       unknown primary, spread to bone, brain died in 23's  . Cancer Paternal Aunt        unk type  . Cancer Paternal Grandmother        type unk  . Prostate cancer Neg Hx   . Kidney cancer Neg Hx   . Bladder Cancer Neg Hx     Social History Social History   Tobacco Use  . Smoking status: Former Smoker    Packs/day: 1.50    Years: 30.00    Pack years: 45.00    Types: Cigarettes    Quit date: 05/02/1996    Years since quitting: 23.7  . Smokeless tobacco: Never Used  Vaping Use  . Vaping Use:  Never used  Substance Use Topics  . Alcohol use: Yes    Alcohol/week: 0.0 standard drinks    Comment: occ.  . Drug use: No    Review of Systems Constitutional: Negative for fever. Cardiovascular: Negative for chest pain. Respiratory: Negative for shortness of breath. Gastrointestinal: Negative for abdominal pain, vomiting Musculoskeletal: Negative for musculoskeletal complaints Neurological: Negative for headache All other ROS negative  ____________________________________________   PHYSICAL EXAM:  VITAL SIGNS: ED Triage Vitals  Enc Vitals Group     BP 01/20/20 1305 113/80     Pulse Rate 01/20/20 1305 100     Resp 01/20/20 1305 18     Temp 01/20/20 1305 98 F (36.7 C)     Temp Source 01/20/20 1305 Oral     SpO2 01/20/20 1305 96 %     Weight 01/20/20 1306 130 lb (59 kg)     Height 01/20/20 1306 5\' 1"  (1.549 m)     Head Circumference --      Peak Flow --      Pain Score 01/20/20 1312 7     Pain Loc --      Pain Edu? --      Excl. in [place]? --     Constitutional: Alert and oriented. Well appearing and in no distress. Eyes: Normal exam ENT      Head: Normocephalic and atraumatic.      Mouth/Throat: Mucous membranes are moist. Cardiovascular: Normal rate, regular rhythm. No murmurs, rubs, or gallops. Respiratory: Normal respiratory effort without tachypnea nor retractions. Breath sounds are clear Gastrointestinal: Soft, mild tenderness across the entire upper abdomen with no area of focal tenderness identified.  Mild tenderness across the lower abdomen as well.  No rebound or guarding.  No distention. Musculoskeletal: Nontender with normal range of motion in all extremities.  Neurologic:  Normal speech and language. No gross focal neurologic deficits  Skin:  Skin is warm, dry and intact.  Psychiatric: Mood and affect are normal.  ____________________________________________    EKG  EKG viewed and interpreted by myself shows normal sinus rhythm at 94 bpm with a  narrow QRS, normal axis, normal intervals, nonspecific ST changes.  ____________________________________________   INITIAL IMPRESSION / ASSESSMENT AND PLAN / ED COURSE  Pertinent labs & imaging results that were available during my care of the patient were  reviewed by me and considered in my medical decision making (see chart for details).   Patient presents emergency department for abdominal pain which has been ongoing for approximately 1 month although worse recently per patient.  She is also been coughing per patient.  Patient's work-up today shows reassuring labs including a normal white blood cell count, normal LFTs and lipase.  Patient has undergone a CT scan approximately 3 weeks ago, right upper quadrant ultrasound less than 1 week ago all with normal results.  Given the patient's reassuring lab work today I do not believe further imaging would be helpful besides possibly a HIDA scan or MRCP.  However given her reassuring work-up labs and vitals I believe this could be performed on an outpatient basis with her surgeon with whom she is already following up with multiple times.  We will discharge the patient with a short course of pain medication.  I discussed return precautions.  Patient agreeable to plan of care.  Covid is negative.  LAMERLE JABS was evaluated in Emergency Department on 01/20/2020 for the symptoms described in the history of present illness. She was evaluated in the context of the global COVID-19 pandemic, which necessitated consideration that the patient might be at risk for infection with the SARS-CoV-2 virus that causes COVID-19. Institutional protocols and algorithms that pertain to the evaluation of patients at risk for COVID-19 are in a state of rapid change based on information released by regulatory bodies including the CDC and federal and state organizations. These policies and algorithms were followed during the patient's care in the  ED.  ____________________________________________   FINAL CLINICAL IMPRESSION(S) / ED DIAGNOSES  Abdominal pain   Harvest Dark, MD 01/20/20 1829

## 2020-01-20 NOTE — Patient Instructions (Addendum)
We recommend you go to the emergency room to see if they can better treat your pain.  We did an injection into your right side of Kenalog mixed with Lidocaine.

## 2020-01-20 NOTE — ED Notes (Signed)
See triage note  Presents with pain to RUQ   States she went to MD today  States she went to f/u  Told the MD about her pain  States she was given couple of injections to area  No relief

## 2020-01-21 ENCOUNTER — Telehealth: Payer: Self-pay

## 2020-01-21 NOTE — Telephone Encounter (Signed)
Copied from [place] [id number]. Topic: General - Inquiry >> Jan 21, 2020  2:09 PM [name] D wrote: Reason for CRM: Pt called saying she has a cough and sore throat.  It started about a week ago.  She was in the ER yesterday for stomach pain and they did a covid test and it was negative.  She wants to know if Denise Watts can call in an antibiotic.  She thinks that it is Bronchitis.  [name]  CB#  [phone]

## 2020-01-21 NOTE — Telephone Encounter (Signed)
Schedule virtual visit Thursday 01-23-20.

## 2020-01-23 ENCOUNTER — Telehealth (INDEPENDENT_AMBULATORY_CARE_PROVIDER_SITE_OTHER): Payer: Medicare HMO | Admitting: Physician Assistant

## 2020-01-23 DIAGNOSIS — J441 Chronic obstructive pulmonary disease with (acute) exacerbation: Secondary | ICD-10-CM

## 2020-01-23 MED ORDER — PREDNISONE 20 MG PO TABS: 40.0000 mg | ORAL_TABLET | Freq: Every day | ORAL | 0 refills | Status: AC

## 2020-01-23 MED ORDER — DOXYCYCLINE HYCLATE 100 MG PO TABS: 100.0000 mg | ORAL_TABLET | Freq: Two times a day (BID) | ORAL | 0 refills | Status: AC

## 2020-01-23 NOTE — Progress Notes (Signed)
MyChart Video Visit    Virtual Visit via Video Note   This visit type was conducted due to national recommendations for restrictions regarding the COVID-19 Pandemic (e.g. social distancing) in an effort to limit this patient's exposure and mitigate transmission in our community. This patient is at least at moderate risk for complications without adequate follow up. This format is felt to be most appropriate for this patient at this time. Physical exam was limited by quality of the video and audio technology used for the visit.   Patient location: Home Provider location: Office   I discussed the limitations of evaluation and management by telemedicine and the availability of in person appointments. The patient expressed understanding and agreed to proceed.  Patient: Denise Watts   DOB: 09/04/1955   64 y.o. Female  MRN: [id number] Visit Date: 01/23/2020  Today's healthcare provider: Trinna Post, PA-C   Chief Complaint  Patient presents with  . Cough   Subjective    Cough This is a new problem. The current episode started 1 to 4 weeks ago. The problem has been gradually worsening. The problem occurs every few minutes. The cough is non-productive. Associated symptoms include chest pain, chills, headaches, rhinorrhea, a sore throat and shortness of breath. Pertinent negatives include no ear congestion, ear pain, fever, heartburn, myalgias, nasal congestion, postnasal drip, sweats or wheezing. Nothing aggravates the symptoms. Treatments tried: samubuol, mucinex. The treatment provided no relief. Her past medical history is significant for bronchitis and COPD. There is no history of asthma or environmental allergies.    Symptoms started a week. She has been vaccinated against COVID. Last vaccine was 08/2019. COVID tested two days ago and was negative.     Medications: Outpatient Medications Prior to Visit  Medication Sig  . cetirizine (ZYRTEC) 10 MG tablet Take 10 mg by mouth  daily.  . Cyanocobalamin (B-12 COMPLIANCE INJECTION IJ) Inject 1 Dose as directed every 30 (thirty) days.  . cyclobenzaprine (FLEXERIL) 5 MG tablet Take 1 tablet (5 mg total) by mouth 3 (three) times daily as needed for muscle spasms.  . DULoxetine (CYMBALTA) 60 MG capsule Take 1 capsule by mouth daily.  Marland Kitchen gabapentin (NEURONTIN) 300 MG capsule Take 1 capsule (300 mg total) by mouth 3 (three) times daily.  Marland Kitchen omeprazole (PRILOSEC) 40 MG capsule TAKE 1 CAPSULE BY MOUTH EVERY DAY (MORNING)  . ondansetron (ZOFRAN) 4 MG tablet Take 1 tablet (4 mg total) by mouth every 8 (eight) hours as needed for nausea or vomiting.  Marland Kitchen oxyCODONE (ROXICODONE) 5 MG immediate release tablet Take 1 tablet (5 mg total) by mouth every 8 (eight) hours as needed.  . phenazopyridine (PYRIDIUM) 200 MG tablet Take 1 tablet (200 mg total) by mouth 3 (three) times daily as needed for pain.  . polyethylene glycol-electrolytes (NULYTELY) 420 g solution Take 4,000 mLs by mouth as directed.   Marland Kitchen rOPINIRole (REQUIP) 0.25 MG tablet Take by mouth.   Facility-Administered Medications Prior to Visit  Medication Dose Route Frequency Provider  . cyanocobalamin ((VITAMIN B-12)) injection 1,000 mcg  1,000 mcg Intramuscular Q30 days Sindy Guadeloupe, MD  . cyanocobalamin ((VITAMIN B-12)) injection 1,000 mcg  1,000 mcg Intramuscular Q30 days Sindy Guadeloupe, MD  . cyanocobalamin ((VITAMIN B-12)) injection 1,000 mcg  1,000 mcg Intramuscular Q30 days Sindy Guadeloupe, MD  . cyanocobalamin ((VITAMIN B-12)) injection 1,000 mcg  1,000 mcg Intramuscular Q30 days Sindy Guadeloupe, MD    Review of Systems  Constitutional: Positive for chills. Negative  for fever.  HENT: Positive for rhinorrhea and sore throat. Negative for ear pain and postnasal drip.   Respiratory: Positive for cough and shortness of breath. Negative for wheezing.   Cardiovascular: Positive for chest pain.  Gastrointestinal: Negative for heartburn.  Musculoskeletal: Negative for myalgias.   Allergic/Immunologic: Negative for environmental allergies.  Neurological: Positive for headaches.      Objective    There were no vitals taken for this visit.   Physical Exam Constitutional:      Appearance: Normal appearance.  Pulmonary:     Effort: Pulmonary effort is normal. No respiratory distress.     Comments: She is coughing.  Neurological:     Mental Status: She is alert.  Psychiatric:        Mood and Affect: Mood normal.        Behavior: Behavior normal.        Assessment & Plan    1. COPD exacerbation ([place])  COVID test negative and this test was taken 4-5 days after symptoms. Will treat for COPD exacerbation.   - doxycycline (VIBRA-TABS) 100 MG tablet; Take 1 tablet (100 mg total) by mouth 2 (two) times daily for 7 days.  Dispense: 14 tablet; Refill: 0 - predniSONE (DELTASONE) 20 MG tablet; Take 2 tablets (40 mg total) by mouth daily with breakfast for 5 days.  Dispense: 10 tablet; Refill: 0   Return if symptoms worsen or fail to improve.     I discussed the assessment and treatment plan with the patient. The patient was provided an opportunity to ask questions and all were answered. The patient agreed with the plan and demonstrated an understanding of the instructions.   The patient was advised to call back or seek an in-person evaluation if the symptoms worsen or if the condition fails to improve as anticipated.   ITrinna Post, PA-C, have reviewed all documentation for this visit. The documentation on 01/23/20 for the exam, diagnosis, procedures, and orders are all accurate and complete.  The entirety of the information documented in the History of Present Illness, Review of Systems and Physical Exam were personally obtained by me. Portions of this information were initially documented by Elonda Husky, CMA and reviewed by me for thoroughness and accuracy.    Paulene Floor [facility] [phone] (phone) [phone]  (fax)  [place]

## 2020-01-23 NOTE — Telephone Encounter (Signed)
App't made with Fabio Bering

## 2020-01-27 DIAGNOSIS — C44722 Squamous cell carcinoma of skin of right lower limb, including hip: Secondary | ICD-10-CM | POA: Diagnosis not present

## 2020-02-03 ENCOUNTER — Telehealth: Payer: Self-pay | Admitting: Family Medicine

## 2020-02-03 ENCOUNTER — Other Ambulatory Visit: Payer: Self-pay | Admitting: Family Medicine

## 2020-02-03 MED ORDER — CLOTRIMAZOLE 10 MG MT TROC: 10.0000 mg | Freq: Every day | OROMUCOSAL | 0 refills | Status: DC

## 2020-02-03 NOTE — Telephone Encounter (Signed)
States mouth and throat red and "blistered" since taking the Doxycycline with Prednisone. Will treat with Mycelex Troches and recommend she call back in the morning to set up virtual video visit for tomorrow or Thursday.

## 2020-02-03 NOTE — Telephone Encounter (Signed)
Pt called stating that her whole mouth has filled with blisters. She states that she has finished the prednisone she was given with no reflief. Please advise.       [facility] [address] [place], [place] - [place] [address]  Phone: [phone] Fax: [phone]  Hours: Not open 24 hours

## 2020-02-04 ENCOUNTER — Other Ambulatory Visit: Payer: Self-pay

## 2020-02-04 ENCOUNTER — Telehealth (INDEPENDENT_AMBULATORY_CARE_PROVIDER_SITE_OTHER): Payer: Medicare HMO | Admitting: Family Medicine

## 2020-02-04 ENCOUNTER — Encounter: Payer: Medicare HMO | Admitting: Family Medicine

## 2020-02-04 ENCOUNTER — Encounter: Payer: Self-pay | Admitting: Family Medicine

## 2020-02-04 DIAGNOSIS — K121 Other forms of stomatitis: Secondary | ICD-10-CM | POA: Diagnosis not present

## 2020-02-04 DIAGNOSIS — R053 Chronic cough: Secondary | ICD-10-CM | POA: Diagnosis not present

## 2020-02-04 DIAGNOSIS — Z85819 Personal history of malignant neoplasm of unspecified site of lip, oral cavity, and pharynx: Secondary | ICD-10-CM

## 2020-02-04 DIAGNOSIS — Z853 Personal history of malignant neoplasm of breast: Secondary | ICD-10-CM

## 2020-02-04 MED ORDER — CLOTRIMAZOLE 10 MG MT TROC: 10.0000 mg | Freq: Every day | OROMUCOSAL | 0 refills | Status: DC

## 2020-02-04 MED ORDER — BENZONATATE 100 MG PO CAPS: 100.0000 mg | ORAL_CAPSULE | Freq: Three times a day (TID) | ORAL | 0 refills | Status: DC

## 2020-02-04 NOTE — Progress Notes (Signed)
MyChart Video Visit    Virtual Visit via Video Note   This visit type was conducted due to national recommendations for restrictions regarding the COVID-19 Pandemic (e.g. social distancing) in an effort to limit this patient's exposure and mitigate transmission in our community. This patient is at least at moderate risk for complications without adequate follow up. This format is felt to be most appropriate for this patient at this time. Physical exam was limited by quality of the video and audio technology used for the visit.   Patient location: Home Provider location: Office  I discussed the limitations of evaluation and management by telemedicine and the availability of in person appointments. The patient expressed understanding and agreed to proceed.  Patient: Denise Watts   DOB: Apr 11, 1956   64 y.o. Denise  MRN: [id number] Visit Date: 02/04/2020  Today's healthcare provider: Vernie Murders, PA   No chief complaint on file.  Subjective    HPI   Patient is a 64 year old Denise who presents to be evaluated for oral blisters.  She states they are in her mouth and on her tongue and throat.  She has had them for about 4 days.  She has also had cough and cold symptoms for about 3 weeks that has been treated with antibiotics.  She had a negative covid test on 01/20/20.  Past Medical History:  Diagnosis Date  . Anxiety   . Arthritis    KNEE RIGHT  . Breast cancer [facility]) 1997   right breast lumpectomy with radiation  . Breast cancer ([place]) 11/15/2017   INVASIVE MAMMARY CARCINOMA/ triple negative  . 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 Cough 03/02/2017   INTERMTTENT DRY COUGH  . Depression   . GERD (gastroesophageal reflux disease)   . History of hiatal hernia   . Personal history of chemotherapy   . Personal history of radiation therapy   . Skin cancer   . Throat cancer [facility]) 1998   radiation   Past Surgical History:  Procedure Laterality Date  . BREAST BIOPSY Right  1997   positive  . BREAST BIOPSY Right 11/15/2017   INVASIVE MAMMARY CARCINOMA triple negative  . BREAST LUMPECTOMY Right 1997   2019 also  . BREAST LUMPECTOMY WITH SENTINEL LYMPH NODE BIOPSY Right 12/08/2017   Procedure: BREAST LUMPECTOMY WITH SENTINEL LYMPH NODE BX;  Surgeon: Robert Bellow, MD;  Location: [facility] ORS;  Service: General;  Laterality: Right;  . BREAST REDUCTION WITH MASTOPEXY Left 06/10/2019   Procedure: Left breast mastopexy reduction for symmetry;  Surgeon: Wallace Going, DO;  Location: [place];  Service: Plastics;  Laterality: Left;  total 2.5 hours  . COLONOSCOPY  2015  . COLONOSCOPY WITH PROPOFOL N/A 10/29/2019   Procedure: COLONOSCOPY WITH PROPOFOL;  Surgeon: Lucilla Lame, MD;  Location[facility] ENDOSCOPY;  Service: Endoscopy;  Laterality: N/A;  . ESOPHAGOGASTRODUODENOSCOPY (EGD) WITH PROPOFOL N/A 05/24/2019   Procedure: ESOPHAGOGASTRODUODENOSCOPY (EGD) WITH PROPOFOL;  Surgeon: Lucilla Lame, MD;  Location: [facility] ENDOSCOPY;  Service: Endoscopy;  Laterality: N/A;  . ESOPHAGUS SURGERY    . KNEE SURGERY Right   . LIPOSUCTION WITH LIPOFILLING Right 06/10/2019   Procedure: right breast scar and capsule release with fat grafting;  Surgeon: Wallace Going, DO;  Location: [place];  Service: Plastics;  Laterality: Right;  . PORT-A-CATH REMOVAL    . PORTACATH PLACEMENT Right 01/17/2018   Procedure: INSERTION PORT-A-CATH- RIGHT;  Surgeon: Robert Bellow, MD;  Location: [facility] ORS;  Service: General;  Laterality: Right;  . ROBOTIC ADRENALECTOMY Right 12/28/2016   Procedure: ROBOTIC ADRENALECTOMY;  Surgeon: Hollice Espy, MD;  Location: [facility] ORS;  Service: Urology;  Laterality: Right;  . SKIN CANCER EXCISION    . THROAT SURGERY  1998   throat cancer   . TUBAL LIGATION    . VENTRAL HERNIA REPAIR N/A 03/03/2017   Procedure: HERNIA REPAIR VENTRAL ADULT;  Surgeon: Clayburn Pert, MD;  Location: [facility] ORS;  Service: General;  Laterality: N/A;     Social History   Tobacco Use  . Smoking status: Former Smoker    Packs/day: 1.50    Years: 30.00    Pack years: 45.00    Types: Cigarettes    Quit date: 05/02/1996    Years since quitting: 23.7  . Smokeless tobacco: Never Used  Vaping Use  . Vaping Use: Never used  Substance Use Topics  . Alcohol use: Yes    Alcohol/week: 0.0 standard drinks    Comment: occ.  . Drug use: No   Family Status  Relation Name Status  . Mother  Deceased at age 40  . Father  Deceased at age 32       mva  . Sister  Deceased  . Sister  Deceased  . Ethlyn Daniels  (Not Specified)  . MGM  Deceased  . MGF  Deceased  . PGM  Deceased  . PGF  Deceased  . Sister mat half Alive  . Neg Hx  (Not Specified)   No Known Allergies    Medications: Outpatient Medications Prior to Visit  Medication Sig  . cetirizine (ZYRTEC) 10 MG tablet Take 10 mg by mouth daily.  . clotrimazole (MYCELEX) 10 MG troche Take 1 tablet (10 mg total) by mouth 5 (five) times daily. Dissolve tablet in mouth.  . Cyanocobalamin (B-12 COMPLIANCE INJECTION IJ) Inject 1 Dose as directed every 30 (thirty) days.  . cyclobenzaprine (FLEXERIL) 5 MG tablet Take 1 tablet (5 mg total) by mouth 3 (three) times daily as needed for muscle spasms.  . DULoxetine (CYMBALTA) 60 MG capsule Take 1 capsule by mouth daily.  Marland Kitchen gabapentin (NEURONTIN) 300 MG capsule Take 1 capsule (300 mg total) by mouth 3 (three) times daily.  Marland Kitchen omeprazole (PRILOSEC) 40 MG capsule TAKE 1 CAPSULE BY MOUTH EVERY DAY (MORNING)  . ondansetron (ZOFRAN) 4 MG tablet Take 1 tablet (4 mg total) by mouth every 8 (eight) hours as needed for nausea or vomiting.  Marland Kitchen oxyCODONE (ROXICODONE) 5 MG immediate release tablet Take 1 tablet (5 mg total) by mouth every 8 (eight) hours as needed.  . phenazopyridine (PYRIDIUM) 200 MG tablet Take 1 tablet (200 mg total) by mouth 3 (three) times daily as needed for pain.  . polyethylene glycol-electrolytes (NULYTELY) 420 g solution Take 4,000 mLs by  mouth as directed.   Marland Kitchen rOPINIRole (REQUIP) 0.25 MG tablet Take by mouth.   Facility-Administered Medications Prior to Visit  Medication Dose Route Frequency Provider  . cyanocobalamin ((VITAMIN B-12)) injection 1,000 mcg  1,000 mcg Intramuscular Q30 days Sindy Guadeloupe, MD  . cyanocobalamin ((VITAMIN B-12)) injection 1,000 mcg  1,000 mcg Intramuscular Q30 days Sindy Guadeloupe, MD  . cyanocobalamin ((VITAMIN B-12)) injection 1,000 mcg  1,000 mcg Intramuscular Q30 days Sindy Guadeloupe, MD  . cyanocobalamin ((VITAMIN B-12)) injection 1,000 mcg  1,000 mcg Intramuscular Q30 days Sindy Guadeloupe, MD    Review of Systems  Constitutional: Positive for fatigue and fever.  HENT: Positive for congestion, sinus pressure and  sore throat. Negative for ear discharge, ear pain, postnasal drip and rhinorrhea.   Respiratory: Positive for cough, shortness of breath and wheezing.   Cardiovascular: Negative for chest pain, palpitations and leg swelling.  Gastrointestinal: Positive for abdominal pain. Negative for diarrhea.  Musculoskeletal: Positive for arthralgias.  Neurological: Positive for dizziness and headaches.      Objective    There were no vitals taken for this visit.   Physical Exam: WDWN Denise in no apparent distress.  Head: Normocephalic, atraumatic. Neck: Supple, NROM Respiratory: No apparent distress. Congested cough. Psych: Normal mood and affect Throat: Ulcer visible on the inside of the lower lip.    Assessment & Plan     1. Stomatitis Ulcers and blisters in mouth since treatment of bronchitis with antibiotic and steroids. May use Chloroseptic Throat  Spray and add Mycelex Troches. Limit acidic foods and recheck if no better in 4-5 days. - clotrimazole (MYCELEX) 10 MG troche; Take 1 tablet (10 mg total) by mouth 5 (five) times daily.  Dispense: 24 tablet; Refill: 0  2. Persistent cough Negative COVID test on 01-20-20 and treated for exacerbation of COPD on 01-23-20 with a  antibiotics. No fever but having some ulcers in mouth since finishing antibiotic and steroid. Will recheck COVID test and give Tessalon Perles to help control cough. Increase fluids and maintain COVID restrictions. - Novel Coronavirus, NAA ([facility]) - benzonatate (TESSALON) 100 MG capsule; Take 1 capsule (100 mg total) by mouth 3 (three) times daily.  Dispense: 20 capsule; Refill: 0  3. History of right breast cancer H/o breast cancer in 1997.  H/o stage I SCC of vocal cord in 2015. Adrenal carcinoma of unknown primary s/p resection  4. History of throat cancer Carcinoma of the vocal cord T1, N0, M0 tumor. Status postradiation therapy.   No follow-ups on file.     I discussed the assessment and treatment plan with the patient. The patient was provided an opportunity to ask questions and all were answered. The patient agreed with the plan and demonstrated an understanding of the instructions.   The patient was advised to call back or seek an in-person evaluation if the symptoms worsen or if the condition fails to improve as anticipated.  I provided 20 minutes of non-face-to-face time during this encounter.  Andres Shad, PA, have reviewed all documentation for this visit. The documentation on 02/04/20 for the exam, diagnosis, procedures, and orders are all accurate and complete.   Vernie Murders, [place] [phone] (phone) [phone] (fax)  [place]

## 2020-02-04 NOTE — Telephone Encounter (Signed)
Pt has been scheduled this afternoon with Simona Huh.  Aware Rx sent to pharmacy for blisters in her mouth.

## 2020-02-05 ENCOUNTER — Other Ambulatory Visit: Payer: Self-pay | Admitting: Surgery

## 2020-02-05 ENCOUNTER — Other Ambulatory Visit: Payer: Self-pay

## 2020-02-05 ENCOUNTER — Ambulatory Visit: Payer: Self-pay | Admitting: Surgery

## 2020-02-05 ENCOUNTER — Telehealth: Payer: Self-pay | Admitting: Oncology

## 2020-02-05 MED ORDER — CYCLOBENZAPRINE HCL 5 MG PO TABS: 5.0000 mg | ORAL_TABLET | Freq: Three times a day (TID) | ORAL | 1 refills | Status: DC | PRN

## 2020-02-05 NOTE — Telephone Encounter (Signed)
Patient is calling and is asking if she can get a refill on his Cyclobenzaprine patient is using [facility] . Please call patient and advise.

## 2020-02-05 NOTE — Telephone Encounter (Signed)
Spoke with patient on this date who stated that she would be out of town and needed to [employer]. Appts rescheduled.

## 2020-02-05 NOTE — Telephone Encounter (Signed)
A refill of Cyclobenzaprine has been sent into the patient's pharmacy. She is scheduled for follow up with Dr Dahlia Byes on 02/19/20.

## 2020-02-06 ENCOUNTER — Encounter: Payer: Self-pay | Admitting: Family Medicine

## 2020-02-06 ENCOUNTER — Telehealth (INDEPENDENT_AMBULATORY_CARE_PROVIDER_SITE_OTHER): Payer: Medicare HMO | Admitting: Family Medicine

## 2020-02-06 DIAGNOSIS — K121 Other forms of stomatitis: Secondary | ICD-10-CM | POA: Diagnosis not present

## 2020-02-06 DIAGNOSIS — R053 Chronic cough: Secondary | ICD-10-CM | POA: Diagnosis not present

## 2020-02-06 LAB — NOVEL CORONAVIRUS, NAA: SARS-CoV-2, NAA: NOT DETECTED

## 2020-02-06 LAB — SARS-COV-2, NAA 2 DAY TAT

## 2020-02-06 MED ORDER — ALBUTEROL SULFATE HFA 108 (90 BASE) MCG/ACT IN AERS: 2.0000 | INHALATION_SPRAY | Freq: Four times a day (QID) | RESPIRATORY_TRACT | 2 refills | Status: DC | PRN

## 2020-02-06 NOTE — Progress Notes (Signed)
MyChart Video Visit    Virtual Visit via Video Note   This visit type was conducted due to national recommendations for restrictions regarding the COVID-19 Pandemic (e.g. social distancing) in an effort to limit this patient's exposure and mitigate transmission in our community. This patient is at least at moderate risk for complications without adequate follow up. This format is felt to be most appropriate for this patient at this time. Physical exam was limited by quality of the video and audio technology used for the visit.   Patient location: Home Provider location: Office  I discussed the limitations of evaluation and management by telemedicine and the availability of in person appointments. The patient expressed understanding and agreed to proceed.  Patient: Denise Watts   DOB: [birth year]   64 y.o. Female  MRN: [id number] Visit Date: 02/06/2020  Today's healthcare provider: Vernie Murders, PA   Chief Complaint  Patient presents with  . Cough   Subjective    URI  This is a new problem. Episode onset: 2 weeks ago. The problem has been unchanged. Associated symptoms include congestion (chest), coughing (productive with white sputum), ear pain (both ears), headaches, rhinorrhea, sinus pain, a sore throat and wheezing. Pertinent negatives include no abdominal pain, chest pain, nausea, sneezing or vomiting. Associated symptoms comments: Oral blisters.   Patient was seen for a virtual visit 2 days ago for symptoms listed above. She was prescribed clotrimazole (MYCELEX) 10 MG troche to take for mouth blisters and Tessalon Perles for cough. Patient reports good compliance with treatment, and good tolerance. States mouth and throat a little better this morning.    Patient Active Problem List   Diagnosis Date Noted  . Encounter for screening colonoscopy   . Polyp of transverse colon   . Dysphagia   . Stricture and stenosis of esophagus   . Cervical spondylosis 03/18/2019  .  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 Need for prophylactic vaccination and inoculation against influenza 01/25/2019  . Postoperative breast asymmetry 01/04/2019  . Status post partial mastectomy 01/04/2019  . Vitamin D deficiency 12/12/2018  . Bursitis of left shoulder 11/21/2018  . Rotator cuff tendinitis, left 11/21/2018  . Genetic testing 02/01/2018  . Skin cancer 01/18/2018  . Goals of care, counseling/discussion 01/12/2018  . B12 deficiency 12/28/2017  . Malignant neoplasm of lower-outer quadrant of right breast of female, estrogen receptor negative ([place]) 11/24/2017  . Chronic abdominal pain (Primary Area of Pain)  04/03/2017  . Chronic low back pain (Secondary Area of Pain) (B) (R>L) 04/03/2017  . Chronic knee pain [facility]) (R) 04/03/2017  . Chronic pain syndrome 04/03/2017  . Disorder of bone, unspecified 04/03/2017  . Other specified health status 04/03/2017  . Other long term (current) drug therapy 04/03/2017  . Long term current use of opiate analgesic 04/03/2017  . Chronic sacroiliac joint pain 04/03/2017  . S/P repair of ventral hernia 03/03/2017  . Incisional hernia, without obstruction or gangrene 02/13/2017  . Right adrenal mass ([place]) 12/28/2016  . RIGHT Adrenal Mass 12/16/2016  . Fatigue 02/17/2016  . Other fatigue 02/17/2016  . Multiple lung nodules on CT 02/17/2016  . History of right breast cancer 02/17/2016  . Peripheral neuropathy 02/17/2016  . Closed fracture of a rib 02/02/2015  . History of fundoplication 85/27/7824  . History of throat cancer 01/15/2015  . Acid reflux 01/15/2015  . Gonalgia 01/15/2015  . Closed fracture of pubis ([place]) 01/15/2015  . Pelvic fracture ([place]) 01/12/2015  . 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 Herpes simplex virus (HSV) epithelial keratitis 03/06/2013  . Nuclear sclerotic cataract 03/06/2013  . Onychia of finger 10/23/2008  . Odontogenic tumor 03/13/2008  . Tobacco use 06/14/2007   No Known  Allergies    Medications: Outpatient Medications Prior to Visit  Medication Sig  . benzonatate (TESSALON) 100 MG capsule Take 1 capsule (100 mg total) by mouth 3 (three) times daily.  . cetirizine (ZYRTEC) 10 MG tablet Take 10 mg by mouth daily.  . clotrimazole (MYCELEX) 10 MG troche Take 1 tablet (10 mg total) by mouth 5 (five) times daily.  . Cyanocobalamin (B-12 COMPLIANCE INJECTION IJ) Inject 1 Dose as directed every 30 (thirty) days.  . cyclobenzaprine (FLEXERIL) 5 MG tablet Take 1 tablet (5 mg total) by mouth 3 (three) times daily as needed for muscle spasms.  . DULoxetine (CYMBALTA) 60 MG capsule Take 1 capsule by mouth daily.  Marland Kitchen gabapentin (NEURONTIN) 300 MG capsule Take 1 capsule (300 mg total) by mouth 3 (three) times daily.  Marland Kitchen omeprazole (PRILOSEC) 40 MG capsule TAKE 1 CAPSULE BY MOUTH EVERY DAY (MORNING)  . ondansetron (ZOFRAN) 4 MG tablet Take 1 tablet (4 mg total) by mouth every 8 (eight) hours as needed for nausea or vomiting.  Marland Kitchen oxyCODONE (ROXICODONE) 5 MG immediate release tablet Take 1 tablet (5 mg total) by mouth every 8 (eight) hours as needed.  . phenazopyridine (PYRIDIUM) 200 MG tablet Take 1 tablet (200 mg total) by mouth 3 (three) times daily as needed for pain.  . polyethylene glycol-electrolytes (NULYTELY) 420 g solution Take 4,000 mLs by mouth as directed.   Marland Kitchen rOPINIRole (REQUIP) 0.25 MG tablet Take by mouth.   Facility-Administered Medications Prior to Visit  Medication Dose Route Frequency Provider  . cyanocobalamin ((VITAMIN B-12)) injection 1,000 mcg  1,000 mcg Intramuscular Q30 days Sindy Guadeloupe, MD  . cyanocobalamin ((VITAMIN B-12)) injection 1,000 mcg  1,000 mcg Intramuscular Q30 days Sindy Guadeloupe, MD  . cyanocobalamin ((VITAMIN B-12)) injection 1,000 mcg  1,000 mcg Intramuscular Q30 days Sindy Guadeloupe, MD  . cyanocobalamin ((VITAMIN B-12)) injection 1,000 mcg  1,000 mcg Intramuscular Q30 days Sindy Guadeloupe, MD    Review of Systems  Constitutional:  Positive for fatigue. Negative for appetite change, chills, diaphoresis and fever.  HENT: Positive for congestion (chest), ear pain (both ears), mouth sores (blisters), rhinorrhea, sinus pressure, sinus pain and sore throat. Negative for postnasal drip and sneezing.        Ears feel full and painful swallowing  Eyes: Negative for discharge and itching.  Respiratory: Positive for cough (productive with white sputum), chest tightness, shortness of breath and wheezing.   Cardiovascular: Negative for chest pain and palpitations.  Gastrointestinal: Negative for abdominal pain, nausea and vomiting.  Neurological: Positive for headaches. Negative for dizziness and weakness.      Objective    There were no vitals taken for this visit.   Physical Exam: WDWN female in no apparent distress.  Head: Normocephalic, atraumatic.  Neck: Supple, NROM Respiratory: No apparent distress Psych: Normal mood and affect ENT: Not wearing dentures due to ulcers in mouth. Sounds stuffy but no acute respiratory distress.    Assessment & Plan     1. Stomatitis Slow to improve with use of Mycelex and Chloroseptic Throat Spray. COVID test negative.on 02-04-20.   2. Persistent cough Some slight wheeze with cough but no sputum or fever. May use Mucinex-DM with the [facility]. Will add Albuterol_HFA prn wheeze. If dyspnea and  wheezing worsens, should go to ER for nebulizer treatment. Increase fluids and continue to monitor temperature. Call report of progress in 3 days. - albuterol (VENTOLIN HFA) 108 (90 Base) MCG/ACT inhaler; Inhale 2 puffs into the lungs every 6 (six) hours as needed for wheezing or shortness of breath.  Dispense: 8 g; Refill: 2   No follow-ups on file.     I discussed the assessment and treatment plan with the patient. The patient was provided an opportunity to ask questions and all were answered. The patient agreed with the plan and demonstrated an understanding of the instructions.    The patient was advised to call back or seek an in-person evaluation if the symptoms worsen or if the condition fails to improve as anticipated.  I provided 10 minutes of non-face-to-face time during this encounter.  Andres Shad, PA, have reviewed all documentation for this visit. The documentation on 02/06/20 for the exam, diagnosis, procedures, and orders are all accurate and complete.   Vernie Murders, [place] [phone] (phone) [phone] (fax)  [place]

## 2020-02-10 DIAGNOSIS — L570 Actinic keratosis: Secondary | ICD-10-CM | POA: Diagnosis not present

## 2020-02-10 DIAGNOSIS — D0461 Carcinoma in situ of skin of right upper limb, including shoulder: Secondary | ICD-10-CM | POA: Diagnosis not present

## 2020-02-10 DIAGNOSIS — C44729 Squamous cell carcinoma of skin of left lower limb, including hip: Secondary | ICD-10-CM | POA: Diagnosis not present

## 2020-02-10 DIAGNOSIS — C44622 Squamous cell carcinoma of skin of right upper limb, including shoulder: Secondary | ICD-10-CM | POA: Diagnosis not present

## 2020-02-10 DIAGNOSIS — D485 Neoplasm of uncertain behavior of skin: Secondary | ICD-10-CM | POA: Diagnosis not present

## 2020-02-12 ENCOUNTER — Ambulatory Visit: Payer: Medicare HMO | Admitting: Radiation Oncology

## 2020-02-14 ENCOUNTER — Other Ambulatory Visit: Payer: Medicare HMO

## 2020-02-14 ENCOUNTER — Ambulatory Visit: Payer: Medicare HMO

## 2020-02-14 ENCOUNTER — Ambulatory Visit: Payer: Medicare HMO | Admitting: Oncology

## 2020-02-14 ENCOUNTER — Ambulatory Visit: Payer: Medicare HMO | Admitting: Radiation Oncology

## 2020-02-18 NOTE — Progress Notes (Signed)
This encounter was created in error - please disregard.

## 2020-02-19 ENCOUNTER — Ambulatory Visit (INDEPENDENT_AMBULATORY_CARE_PROVIDER_SITE_OTHER): Payer: Medicare HMO | Admitting: Surgery

## 2020-02-19 ENCOUNTER — Other Ambulatory Visit: Payer: Self-pay

## 2020-02-19 ENCOUNTER — Encounter: Payer: Self-pay | Admitting: Surgery

## 2020-02-19 VITALS — BP 111/75 | HR 112 | Temp 98.1°F | Resp 12

## 2020-02-19 DIAGNOSIS — R1031 Right lower quadrant pain: Secondary | ICD-10-CM

## 2020-02-19 NOTE — Patient Instructions (Signed)
See your appointment below. Call the office if you have any questions or concerns.

## 2020-02-20 ENCOUNTER — Other Ambulatory Visit: Payer: Self-pay

## 2020-02-20 ENCOUNTER — Inpatient Hospital Stay: Payer: Medicare HMO

## 2020-02-20 ENCOUNTER — Encounter: Payer: Self-pay | Admitting: Oncology

## 2020-02-20 ENCOUNTER — Inpatient Hospital Stay (HOSPITAL_BASED_OUTPATIENT_CLINIC_OR_DEPARTMENT_OTHER): Payer: Medicare HMO | Admitting: Oncology

## 2020-02-20 ENCOUNTER — Inpatient Hospital Stay: Payer: Medicare HMO | Attending: Oncology

## 2020-02-20 VITALS — BP 95/83 | HR 92 | Temp 98.9°F | Resp 16 | Ht 61.0 in | Wt 137.7 lb

## 2020-02-20 DIAGNOSIS — Z23 Encounter for immunization: Secondary | ICD-10-CM | POA: Insufficient documentation

## 2020-02-20 DIAGNOSIS — R5383 Other fatigue: Secondary | ICD-10-CM | POA: Insufficient documentation

## 2020-02-20 DIAGNOSIS — Z9221 Personal history of antineoplastic chemotherapy: Secondary | ICD-10-CM | POA: Insufficient documentation

## 2020-02-20 DIAGNOSIS — Z853 Personal history of malignant neoplasm of breast: Secondary | ICD-10-CM

## 2020-02-20 DIAGNOSIS — Z923 Personal history of irradiation: Secondary | ICD-10-CM | POA: Diagnosis not present

## 2020-02-20 DIAGNOSIS — R5381 Other malaise: Secondary | ICD-10-CM | POA: Insufficient documentation

## 2020-02-20 DIAGNOSIS — R918 Other nonspecific abnormal finding of lung field: Secondary | ICD-10-CM

## 2020-02-20 DIAGNOSIS — F418 Other specified anxiety disorders: Secondary | ICD-10-CM | POA: Insufficient documentation

## 2020-02-20 DIAGNOSIS — Z8521 Personal history of malignant neoplasm of larynx: Secondary | ICD-10-CM | POA: Diagnosis not present

## 2020-02-20 DIAGNOSIS — Z08 Encounter for follow-up examination after completed treatment for malignant neoplasm: Secondary | ICD-10-CM

## 2020-02-20 DIAGNOSIS — Z87891 Personal history of nicotine dependence: Secondary | ICD-10-CM | POA: Diagnosis not present

## 2020-02-20 DIAGNOSIS — Z171 Estrogen receptor negative status [ER-]: Secondary | ICD-10-CM | POA: Diagnosis not present

## 2020-02-20 DIAGNOSIS — K219 Gastro-esophageal reflux disease without esophagitis: Secondary | ICD-10-CM | POA: Insufficient documentation

## 2020-02-20 DIAGNOSIS — Z85828 Personal history of other malignant neoplasm of skin: Secondary | ICD-10-CM | POA: Insufficient documentation

## 2020-02-20 DIAGNOSIS — E538 Deficiency of other specified B group vitamins: Secondary | ICD-10-CM

## 2020-02-20 DIAGNOSIS — Z79899 Other long term (current) drug therapy: Secondary | ICD-10-CM | POA: Diagnosis not present

## 2020-02-20 LAB — CBC WITH DIFFERENTIAL/PLATELET
Abs Immature Granulocytes: 0.02 10*3/uL (ref 0.00–0.07)
Basophils Absolute: 0 10*3/uL (ref 0.0–0.1)
Basophils Relative: 1 %
Eosinophils Absolute: 0.3 10*3/uL (ref 0.0–0.5)
Eosinophils Relative: 6 %
HCT: 42.2 % (ref 36.0–46.0)
Hemoglobin: 14.1 g/dL (ref 12.0–15.0)
Immature Granulocytes: 0 %
Lymphocytes Relative: 20 %
Lymphs Abs: 1 10*3/uL (ref 0.7–4.0)
MCH: 33.5 pg (ref 26.0–34.0)
MCHC: 33.4 g/dL (ref 30.0–36.0)
MCV: 100.2 fL — ABNORMAL HIGH (ref 80.0–100.0)
Monocytes Absolute: 0.6 10*3/uL (ref 0.1–1.0)
Monocytes Relative: 12 %
Neutro Abs: 3.2 10*3/uL (ref 1.7–7.7)
Neutrophils Relative %: 61 %
Platelets: 249 10*3/uL (ref 150–400)
RBC: 4.21 MIL/uL (ref 3.87–5.11)
RDW: 12.3 % (ref 11.5–15.5)
WBC: 5.2 10*3/uL (ref 4.0–10.5)
nRBC: 0 % (ref 0.0–0.2)

## 2020-02-20 LAB — COMPREHENSIVE METABOLIC PANEL
ALT: 14 U/L (ref 0–44)
AST: 20 U/L (ref 15–41)
Albumin: 3.8 g/dL (ref 3.5–5.0)
Alkaline Phosphatase: 66 U/L (ref 38–126)
Anion gap: 8 (ref 5–15)
BUN: 12 mg/dL (ref 8–23)
CO2: 29 mmol/L (ref 22–32)
Calcium: 8.5 mg/dL — ABNORMAL LOW (ref 8.9–10.3)
Chloride: 102 mmol/L (ref 98–111)
Creatinine, Ser: 0.52 mg/dL (ref 0.44–1.00)
GFR, Estimated: >60 mL/min (ref >60)
Glucose, Bld: 94 mg/dL (ref 70–99)
Potassium: 4.2 mmol/L (ref 3.5–5.1)
Sodium: 139 mmol/L (ref 135–145)
Total Bilirubin: 0.6 mg/dL (ref 0.3–1.2)
Total Protein: 6.4 g/dL — ABNORMAL LOW (ref 6.5–8.1)

## 2020-02-20 MED ORDER — CYANOCOBALAMIN 1000 MCG/ML IJ SOLN
1000.0000 ug | INTRAMUSCULAR | Status: DC
  Administered 2020-02-20: 1000 ug via INTRAMUSCULAR
  Filled 2020-02-20: qty 1

## 2020-02-20 MED ORDER — INFLUENZA VAC A&B SA ADJ QUAD 0.5 ML IM PRSY
0.5000 mL | PREFILLED_SYRINGE | Freq: Once | INTRAMUSCULAR | Status: AC
  Administered 2020-02-20: 0.5 mL via INTRAMUSCULAR
  Filled 2020-02-20: qty 0.5

## 2020-02-20 NOTE — Addendum Note (Signed)
Addended by: Luella Cook on: 02/20/2020 12:22 PM   Modules accepted: Orders

## 2020-02-20 NOTE — Progress Notes (Signed)
She says she eats good and drinks good her her. She cont. To have abdominal pain and has full work up with no results as to why the pain. She is concerned about having so many skin cancers that she has to have taken off. 2 on the arm right now, several on legs and once on her face- all basal cell

## 2020-02-21 ENCOUNTER — Encounter: Payer: Self-pay | Admitting: Surgery

## 2020-02-21 ENCOUNTER — Other Ambulatory Visit: Payer: Self-pay | Admitting: Family Medicine

## 2020-02-21 DIAGNOSIS — R053 Chronic cough: Secondary | ICD-10-CM

## 2020-02-21 NOTE — Telephone Encounter (Signed)
Copied from [place] [id number]. Topic: Quick Communication - Rx Refill/Question >> Feb 21, 2020  4:37 PM Mcneil, Ja-Kwan wrote: Medication: benzonatate (TESSALON) 100 MG capsule  Has the patient contacted their pharmacy? no  Preferred Pharmacy (with phone number or street name): [facility], [place]  Phone: [phone] Fax: [phone]  Agent: Please be advised that RX refills may take up to 3 business days. We ask that you follow-up with your pharmacy.

## 2020-02-21 NOTE — Telephone Encounter (Signed)
Requested medications are due for refill today yes  Requested medications are on the active medication list yes    Last visit 02/06/2020   Notes to clinic Unsure if this was to be continued. Please assess.

## 2020-02-21 NOTE — Progress Notes (Signed)
Outpatient Surgical Follow Up  02/21/2020  Denise Watts is an 64 y.o. female.   Chief Complaint  Patient presents with   Follow-up    HPI: Denise Watts is a 64 year old female with chronic right upper quadrant pain.  Work-up has been negative for any acute intra-abdominal abnormality.  I do think that she has a component of muscle spasms and intercostal neuralgia.  She has a had significant improvement after being placed on Flexeril.  He denies any fevers any chills and the right upper quadrant pain is better.  Ultrasound personally reviewed revealed gallbladder sludge.  CT scan without any acute findings  Past Medical History:  Diagnosis Date   Anxiety    Arthritis    KNEE RIGHT   Breast cancer ([place]) 1997   right breast lumpectomy with radiation   Breast cancer ([place]) 11/15/2017   INVASIVE MAMMARY CARCINOMA/ triple negative   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[facility]) 11/10/2014   Cough 03/02/2017   INTERMTTENT DRY COUGH   Depression    GERD (gastroesophageal reflux disease)    History of hiatal hernia    Personal history of chemotherapy    Personal history of radiation therapy    Skin cancer    Throat cancer ([place]) 1998   radiation    Past Surgical History:  Procedure Laterality Date   BREAST BIOPSY Right 1997   positive   BREAST BIOPSY Right 11/15/2017   INVASIVE MAMMARY CARCINOMA triple negative   BREAST LUMPECTOMY Right 1997   2019 also   BREAST LUMPECTOMY WITH SENTINEL LYMPH NODE BIOPSY Right 12/08/2017   Procedure: BREAST LUMPECTOMY WITH SENTINEL LYMPH NODE BX;  Surgeon: Robert Bellow, MD;  Location: [facility] ORS;  Service: General;  Laterality: Right;   BREAST REDUCTION WITH MASTOPEXY Left 06/10/2019   Procedure: Left breast mastopexy reduction for symmetry;  Surgeon: Wallace Going, DO;  Location: [place];  Service: Plastics;  Laterality: Left;  total 2.5 hours   COLONOSCOPY  2015   COLONOSCOPY WITH PROPOFOL N/A 10/29/2019   Procedure:  COLONOSCOPY WITH PROPOFOL;  Surgeon: Lucilla Lame, MD;  Location: [facility] ENDOSCOPY;  Service: Endoscopy;  Laterality: N/A;   ESOPHAGOGASTRODUODENOSCOPY (EGD) WITH PROPOFOL N/A 05/24/2019   Procedure: ESOPHAGOGASTRODUODENOSCOPY (EGD) WITH PROPOFOL;  Surgeon: Lucilla Lame, MD;  Location: [facility] ENDOSCOPY;  Service: Endoscopy;  Laterality: N/A;   ESOPHAGUS SURGERY     KNEE SURGERY Right    LIPOSUCTION WITH LIPOFILLING Right 06/10/2019   Procedure: right breast scar and capsule release with fat grafting;  Surgeon: Wallace Going, DO;  Location: [place];  Service: Plastics;  Laterality: Right;   PORT-A-CATH REMOVAL     PORTACATH PLACEMENT Right 01/17/2018   Procedure: INSERTION PORT-A-CATH- RIGHT;  Surgeon: Robert Bellow, MD;  Location: [facility] ORS;  Service: General;  Laterality: Right;   ROBOTIC ADRENALECTOMY Right 12/28/2016   Procedure: ROBOTIC ADRENALECTOMY;  Surgeon: Hollice Espy, MD;  Location: [facility] ORS;  Service: Urology;  Laterality: Right;   SKIN CANCER EXCISION     THROAT SURGERY  1998   throat cancer    TUBAL LIGATION     VENTRAL HERNIA REPAIR N/A 03/03/2017   Procedure: HERNIA REPAIR VENTRAL ADULT;  Surgeon: Clayburn Pert, MD;  Location: [facility] ORS;  Service: General;  Laterality: N/A;    Family History  Problem Relation Age of Onset   Diabetes Mother    Heart attack Mother    Cancer Sister        cancer of eyes, spread to lung, other  places. died at 36   Cancer Sister 62       unknown primary, spread to bone, brain died in 19's   Cancer Paternal Aunt        unk type   Cancer Paternal Grandmother        type unk   Prostate cancer Neg Hx    Kidney cancer Neg Hx    Bladder Cancer Neg Hx     Social History:  reports that she quit smoking about 23 years ago. Her smoking use included cigarettes. She has a 45.00 pack-year smoking history. She has never used smokeless tobacco. She reports current alcohol use. She reports that she does not  use drugs.  Allergies: No Known Allergies  Medications reviewed.    ROS Full ROS performed and is otherwise negative other than what is stated in HPI   BP 111/75    Pulse (!) 112    Temp 98.1 F (36.7 C)    Resp 12    SpO2 98%   Physical Exam  NAD. Alert Abd: soft, mild RUQ tenderness , no peritonitis  Assessment/Plan:  1. Right lower quadrant abdominal pain This with the patient in detail about atypical symptoms.  She does have gallbladder sludge.  Discussed with her about role of cholecystectomy.  I will be willing to perform it as long as she understands that this may not resolve the pain.  Currently she states that she is improving and would like to wait before making decision for cholecystectomy.  No need for surgical intervention at this time   Greater than 50% of the 25 minutes  visit was spent in counseling/coordination of care   Denise Hamman, MD [place] Surgeon

## 2020-02-24 DIAGNOSIS — C44729 Squamous cell carcinoma of skin of left lower limb, including hip: Secondary | ICD-10-CM | POA: Diagnosis not present

## 2020-02-24 DIAGNOSIS — D0461 Carcinoma in situ of skin of right upper limb, including shoulder: Secondary | ICD-10-CM | POA: Diagnosis not present

## 2020-02-24 DIAGNOSIS — D045 Carcinoma in situ of skin of trunk: Secondary | ICD-10-CM | POA: Diagnosis not present

## 2020-02-24 DIAGNOSIS — C44619 Basal cell carcinoma of skin of left upper limb, including shoulder: Secondary | ICD-10-CM | POA: Diagnosis not present

## 2020-02-24 DIAGNOSIS — L570 Actinic keratosis: Secondary | ICD-10-CM | POA: Diagnosis not present

## 2020-02-24 DIAGNOSIS — D0462 Carcinoma in situ of skin of left upper limb, including shoulder: Secondary | ICD-10-CM | POA: Diagnosis not present

## 2020-02-24 MED ORDER — BENZONATATE 100 MG PO CAPS: 100.0000 mg | ORAL_CAPSULE | Freq: Three times a day (TID) | ORAL | 0 refills | Status: DC

## 2020-02-24 NOTE — Progress Notes (Signed)
Hematology/Oncology Consult note [facility]  [phone] [phone]  Patient Care Team: Chrismon, Vickki Muff, PA as PCP - General (Physician Assistant) Rico Junker, RN as Oncology Nurse Navigator Sindy Guadeloupe, MD as Consulting Physician (Oncology) Bary Castilla Forest Gleason, MD (General Surgery) Jules Husbands, MD as Consulting Physician (General Surgery)   Name of the patient: Denise Watts  [id number]  [birth year]   Date of visit: 02/24/20  Diagnosis- 1.H/o breast cancer in 1997 2. H/o stage I SCC of vocal cord in 2015. 3. Lung nodules4. Adrenal carcinoma of unknown primary s/p resection5. Stage IaT1a NxcM0 ER weakly positive 1-10%, PR negative and her 2 negative  Chief complaint/ Reason for visit-routine follow-up of breast cancer and lung nodules  Heme/Onc history:  Oncology History Overview Note  1. Carcinoma of breast, T1, N0, M0 tumor diagnosis.  In January of 1997 2. Carcinoma of the vocal cord T1, N0, M0 tumor.  Status postradiation therapy 3. Abnormal CT scan of the chest (December, 2015) 4. Repeat CT scan of chest shows stable nodule (July, 2016) 5. Neutropenia with normal hemoglobin and normal platelet count (July, 2016)   Cancer of vocal cord [facility])  11/10/2014 Initial Diagnosis   Cancer of vocal cord   Malignant neoplasm of lower-outer quadrant of right breast of female, estrogen receptor negative ([place])  11/24/2017 Initial Diagnosis   Malignant neoplasm of lower-outer quadrant of right breast of female, estrogen receptor negative ([place])   01/12/2018 Cancer Staging   Staging form: Breast, AJCC 8th Edition - Clinical stage from 01/12/2018: Stage IB (cT1a, cN0, cM0, G3, ER+, PR-, HER2-) - Signed by Sindy Guadeloupe, MD on 01/12/2018   01/30/2018 - 04/26/2018 Chemotherapy   The patient had dexamethasone (DECADRON) 4 MG tablet, 8 mg, Oral, 2 times daily, 1 of 1 cycle, Start date: 01/12/2018, End date: 08/03/2018 palonosetron (ALOXI)  injection 0.25 mg, 0.25 mg, Intravenous,  Once, 4 of 4 cycles Administration: 0.25 mg (01/30/2018), 0.25 mg (02/27/2018), 0.25 mg (03/28/2018), 0.25 mg (04/26/2018) pegfilgrastim (NEULASTA ONPRO KIT) injection 6 mg, 6 mg, Subcutaneous, Once, 4 of 4 cycles Administration: 6 mg (01/30/2018), 6 mg (02/27/2018), 6 mg (03/28/2018), 6 mg (04/26/2018) cyclophosphamide (CYTOXAN) 1,000 mg in sodium chloride 0.9 % 250 mL chemo infusion, 600 mg/m2 = 1,000 mg, Intravenous,  Once, 4 of 4 cycles Administration: 1,000 mg (01/30/2018), 1,000 mg (02/27/2018), 1,000 mg (03/28/2018), 1,000 mg (04/26/2018) DOCEtaxel (TAXOTERE) 130 mg in sodium chloride 0.9 % 250 mL chemo infusion, 75 mg/m2 = 130 mg, Intravenous,  Once, 4 of 4 cycles Dose modification: 60 mg/m2 (original dose 75 mg/m2, Cycle 2, Reason: Dose not tolerated) Administration: 130 mg (01/30/2018), 100 mg (02/27/2018), 100 mg (03/28/2018), 100 mg (04/26/2018)  for chemotherapy treatment.       Interval history-patient reports that her appetite is improving and she is gaining weight.  She does have chronic fatigue.  She has also been diagnosed with skin cancer switch her required excision from time to time recently.  ECOG PS- 1 Pain scale- 0   Review of systems- Review of Systems  Constitutional: Positive for malaise/fatigue. Negative for chills, fever and weight loss.  HENT: Negative for congestion, ear discharge and nosebleeds.   Eyes: Negative for blurred vision.  Respiratory: Negative for cough, hemoptysis, sputum production, shortness of breath and wheezing.   Cardiovascular: Negative for chest pain, palpitations, orthopnea and claudication.  Gastrointestinal: Negative for abdominal pain, blood in stool, constipation, diarrhea, heartburn, melena, nausea and vomiting.  Genitourinary: Negative for  dysuria, flank pain, frequency, hematuria and urgency.  Musculoskeletal: Negative for back pain, joint pain and myalgias.  Skin: Negative for rash.   Neurological: Negative for dizziness, tingling, focal weakness, seizures, weakness and headaches.  Endo/Heme/Allergies: Does not bruise/bleed easily.  Psychiatric/Behavioral: Negative for depression and suicidal ideas. The patient does not have insomnia.      Breast exam was performed in seated and lying down position. Patient is status post right lumpectomy with a well-healed surgical scar. No evidence of any palpable masses. No evidence of axillary adenopathy. No evidence of any palpable masses or lumps in the left breast. No evidence of leftt axillary adenopathy   No Known Allergies   Past Medical History:  Diagnosis Date  . Anxiety   . Arthritis    KNEE RIGHT  . Breast cancer [facility]) 1997   right breast lumpectomy with radiation  . Breast cancer ([place]) 11/15/2017   INVASIVE MAMMARY CARCINOMA/ triple negative  . 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[place]) 11/10/2014  . Cough 03/02/2017   INTERMTTENT DRY COUGH  . Depression   . GERD (gastroesophageal reflux disease)   . History of hiatal hernia   . Personal history of chemotherapy   . Personal history of radiation therapy   . Skin cancer   . Throat cancer [facility]) 1998   radiation     Past Surgical History:  Procedure Laterality Date  . BREAST BIOPSY Right 1997   positive  . BREAST BIOPSY Right 11/15/2017   INVASIVE MAMMARY CARCINOMA triple negative  . BREAST LUMPECTOMY Right 1997   2019 also  . BREAST LUMPECTOMY WITH SENTINEL LYMPH NODE BIOPSY Right 12/08/2017   Procedure: BREAST LUMPECTOMY WITH SENTINEL LYMPH NODE BX;  Surgeon: Robert Bellow, MD;  Location: [facility] ORS;  Service: General;  Laterality: Right;  . BREAST REDUCTION WITH MASTOPEXY Left 06/10/2019   Procedure: Left breast mastopexy reduction for symmetry;  Surgeon: Wallace Going, DO;  Location: [place];  Service: Plastics;  Laterality: Left;  total 2.5 hours  . COLONOSCOPY  2015  . COLONOSCOPY WITH PROPOFOL N/A 10/29/2019   Procedure: COLONOSCOPY WITH  PROPOFOL;  Surgeon: Lucilla Lame, MD;  Location: [facility] ENDOSCOPY;  Service: Endoscopy;  Laterality: N/A;  . ESOPHAGOGASTRODUODENOSCOPY (EGD) WITH PROPOFOL N/A 05/24/2019   Procedure: ESOPHAGOGASTRODUODENOSCOPY (EGD) WITH PROPOFOL;  Surgeon: Lucilla Lame, MD;  Location: [facility] ENDOSCOPY;  Service: Endoscopy;  Laterality: N/A;  . ESOPHAGUS SURGERY    . KNEE SURGERY Right   . LIPOSUCTION WITH LIPOFILLING Right 06/10/2019   Procedure: right breast scar and capsule release with fat grafting;  Surgeon: Wallace Going, DO;  Location: [place];  Service: Plastics;  Laterality: Right;  . PORT-A-CATH REMOVAL    . PORTACATH PLACEMENT Right 01/17/2018   Procedure: INSERTION PORT-A-CATH- RIGHT;  Surgeon: Robert Bellow, MD;  Location: [facility] OR[facility];  Service: General;  Laterality: Right;  . ROBOTIC ADRENALECTOMY Right 12/28/2016   Procedure: ROBOTIC ADRENALECTOMY;  Surgeon: Hollice Espy, MD;  Location: [facility] ORS;  Service: Urology;  Laterality: Right;  . SKIN CANCER EXCISION    . THROAT SURGERY  1998   throat cancer   . TUBAL LIGATION    . VENTRAL HERNIA REPAIR N/A 03/03/2017   Procedure: HERNIA REPAIR VENTRAL ADULT;  Surgeon: Clayburn Pert, MD;  Location: [facility] ORS;  Service: General;  Laterality: N/A;    Social History   Socioeconomic History  . Marital status: Married    Spouse name: Not on file  . Number of children: Not on file  .  Years of education: Not on file  . Highest education level: Not on file  Occupational History  . Not on file  Tobacco Use  . Smoking status: Former Smoker    Packs/day: 1.50    Years: 30.00    Pack years: 45.00    Types: Cigarettes    Quit date: 05/02/1996    Years since quitting: 23.8  . Smokeless tobacco: Never Used  Vaping Use  . Vaping Use: Never used  Substance and Sexual Activity  . Alcohol use: Yes    Alcohol/week: 0.0 standard drinks    Comment: occ.  . Drug use: No  . Sexual activity: Not on file  Other Topics Concern  . Not  on file  Social History Narrative  . Not on file   Social Determinants of Health   Financial Resource Strain:   . Difficulty of Paying Living Expenses: Not on file  Food Insecurity:   . Worried About Charity fundraiser in the Last Year: Not on file  . Ran Out of Food in the Last Year: Not on file  Transportation Needs:   . Lack of Transportation (Medical): Not on file  . Lack of Transportation (Non-Medical): Not on file  Physical Activity:   . Days of Exercise per Week: Not on file  . Minutes of Exercise per Session: Not on file  Stress:   . Feeling of Stress : Not on file  Social Connections:   . Frequency of Communication with Friends and Family: Not on file  . Frequency of Social Gatherings with Friends and Family: Not on file  . Attends Religious Services: Not on file  . Active Member of Clubs or Organizations: Not on file  . Attends Archivist Meetings: Not on file  . Marital Status: Not on file  Intimate Partner Violence:   . Fear of Current or Ex-Partner: Not on file  . Emotionally Abused: Not on file  . Physically Abused: Not on file  . Sexually Abused: Not on file    Family History  Problem Relation Age of Onset  . Diabetes Mother   . Heart attack Mother   . Cancer Sister        cancer of eyes, spread to lung, other places. died at 42  . Cancer Sister 85       unknown primary, spread to bone, brain died in 47's  . Cancer Paternal Aunt        unk type  . Cancer Paternal Grandmother        type unk  . Prostate cancer Neg Hx   . Kidney cancer Neg Hx   . Bladder Cancer Neg Hx      Current Outpatient Medications:  .  albuterol (VENTOLIN HFA) 108 (90 Base) MCG/ACT inhaler, Inhale 2 puffs into the lungs every 6 (six) hours as needed for wheezing or shortness of breath., Disp: 8 g, Rfl: 2 .  cetirizine (ZYRTEC) 10 MG tablet, Take 10 mg by mouth daily., Disp: , Rfl:  .  clotrimazole (MYCELEX) 10 MG troche, Take 1 tablet (10 mg total) by mouth 5 (five)  times daily., Disp: 24 tablet, Rfl: 0 .  Cyanocobalamin (B-12 COMPLIANCE INJECTION IJ), Inject 1 Dose as directed every 30 (thirty) days., Disp: , Rfl:  .  cyclobenzaprine (FLEXERIL) 5 MG tablet, Take 1 tablet (5 mg total) by mouth 3 (three) times daily as needed for muscle spasms., Disp: 30 tablet, Rfl: 1 .  DULoxetine (CYMBALTA) 60 MG capsule, Take 1  capsule by mouth daily., Disp: , Rfl:  .  gabapentin (NEURONTIN) 300 MG capsule, Take 1 capsule (300 mg total) by mouth 3 (three) times daily., Disp: 60 capsule, Rfl: 1 .  omeprazole (PRILOSEC) 40 MG capsule, TAKE 1 CAPSULE BY MOUTH EVERY DAY (MORNING), Disp: 90 capsule, Rfl: 4 .  ondansetron (ZOFRAN) 4 MG tablet, Take 1 tablet (4 mg total) by mouth every 8 (eight) hours as needed for nausea or vomiting., Disp: 20 tablet, Rfl: 0 .  oxyCODONE (ROXICODONE) 5 MG immediate release tablet, Take 1 tablet (5 mg total) by mouth every 8 (eight) hours as needed., Disp: 20 tablet, Rfl: 0 .  phenazopyridine (PYRIDIUM) 200 MG tablet, Take 1 tablet (200 mg total) by mouth 3 (three) times daily as needed for pain., Disp: 12 tablet, Rfl: 0 .  rOPINIRole (REQUIP) 0.25 MG tablet, Take 0.25 mg by mouth 3 (three) times daily. , Disp: , Rfl:  .  benzonatate (TESSALON) 100 MG capsule, Take 1 capsule (100 mg total) by mouth 3 (three) times daily., Disp: 20 capsule, Rfl: 0 No current facility-administered medications for this visit.  Facility-Administered Medications Ordered in Other Visits:  .  cyanocobalamin ((VITAMIN B-12)) injection 1,000 mcg, 1,000 mcg, Intramuscular, Q30 days, Sindy Guadeloupe, MD, 1,000 mcg at 08/03/18 0936 .  cyanocobalamin ((VITAMIN B-12)) injection 1,000 mcg, 1,000 mcg, Intramuscular, Q30 days, Sindy Guadeloupe, MD, 1,000 mcg at 09/28/18 1013 .  cyanocobalamin ((VITAMIN B-12)) injection 1,000 mcg, 1,000 mcg, Intramuscular, Q30 days, Sindy Guadeloupe, MD, 1,000 mcg at 11/30/18 1355 .  cyanocobalamin ((VITAMIN B-12)) injection 1,000 mcg, 1,000 mcg,  Intramuscular, Q30 days, Sindy Guadeloupe, MD, 1,000 mcg at 08/16/19 1144  Physical exam:  Vitals:   02/20/20 1140  BP: 95/83  Pulse: 92  Resp: 16  Temp: 98.9 F (37.2 C)  TempSrc: Tympanic  Weight: 137 lb 11.2 oz (62.5 kg)  Height: 5' 1"  (1.549 m)   Physical Exam Constitutional:      General: She is not in acute distress. Cardiovascular:     Rate and Rhythm: Normal rate and regular rhythm.     Heart sounds: Normal heart sounds.  Pulmonary:     Effort: Pulmonary effort is normal.     Breath sounds: Normal breath sounds.  Abdominal:     General: Bowel sounds are normal.     Palpations: Abdomen is soft.  Skin:    General: Skin is warm and dry.  Neurological:     Mental Status: She is alert and oriented to person, place, and time.      CMP Latest Ref Rng & Units 02/20/2020  Glucose 70 - 99 mg/dL 94  BUN 8 - 23 mg/dL 12  Creatinine 0.44 - 1.00 mg/dL 0.52  Sodium 135 - 145 mmol/L 139  Potassium 3.5 - 5.1 mmol/L 4.2  Chloride 98 - 111 mmol/L 102  CO2 22 - 32 mmol/L 29  Calcium 8.9 - 10.3 mg/dL 8.5(L)  Total Protein 6.5 - 8.1 g/dL 6.4(L)  Total Bilirubin 0.3 - 1.2 mg/dL 0.6  Alkaline Phos 38 - 126 U/L 66  AST 15 - 41 U/L 20  ALT 0 - 44 U/L 14   CBC Latest Ref Rng & Units 02/20/2020  WBC 4.0 - 10.5 K/uL 5.2  Hemoglobin 12.0 - 15.0 g/dL 14.1  Hematocrit 36 - 46 % 42.2  Platelets 150 - 400 K/uL 249    No images are attached to the encounter.  No results found.   Assessment and plan- Patient is a  64 y.o. female who is here for follow-up of following issues:  1.  Right breast cancer: Clinically she is doing well with no concerning signs and symptoms of recurrence.  No palpable masses in her right breast.  Recent mammogram from August 2021 was unremarkable.  2.  Adrenal mass: S/p resection. she also had a CT scan in December 30, 2019 which did not show any evidence of malignancy.  Continue to monitor  3.  History of skin cancers: Being followed by dermatology  4.   History of B12 deficiency: Continue monthly B12 injections  CBC with differential CMP B12 levels and CT chest abdomen pelvis with contrast to be done in 6 months and I will see her thereafter.   Visit Diagnosis 1. B12 deficiency   2. Lung nodules   3. Encounter for follow-up surveillance of breast cancer      Dr. Randa Evens, MD, MPH [facility] at [facility] [phone] 02/24/2020 2:53 PM

## 2020-02-27 NOTE — Progress Notes (Addendum)
Subjective:   Denise Watts is a 64 y.o. female who presents for an Initial Medicare Annual Wellness Visit.  I connected with Denise Watts today by telephone and verified that I am speaking with the correct person using two identifiers. Location patient: home Location provider: work Persons participating in the virtual visit: patient, provider.   I discussed the limitations, risks, security and privacy concerns of performing an evaluation and management service by telephone and the availability of in person appointments. I also discussed with the patient that there may be a patient responsible charge related to this service. The patient expressed understanding and verbally consented to this telephonic visit.    Interactive audio and video telecommunications were attempted between this provider and patient, however failed, due to patient having technical difficulties OR patient did not have access to video capability.  We continued and completed visit with audio only.   Review of Systems    N/A  Cardiac Risk Factors include: advanced age (>34men, >67 women)     Objective:    There were no vitals filed for this visit. There is no height or weight on file to calculate BMI.  Advanced Directives 03/02/2020 02/20/2020 01/20/2020 10/29/2019 08/16/2019 06/10/2019 06/03/2019  Does Patient Have a Medical Advance Directive? No No No No No No No  Would patient like information on creating a medical advance directive? No - Patient declined No - Patient declined No - Patient declined - No - Patient declined No - Patient declined No - Patient declined    Current Medications (verified) Outpatient Encounter Medications as of 03/02/2020  Medication Sig   albuterol (VENTOLIN HFA) 108 (90 Base) MCG/ACT inhaler Inhale 2 puffs into the lungs every 6 (six) hours as needed for wheezing or shortness of breath.   benzonatate (TESSALON) 100 MG capsule Take 1 capsule (100 mg total) by mouth 3 (three) times daily.    cetirizine (ZYRTEC) 10 MG tablet Take 10 mg by mouth daily.   clotrimazole (MYCELEX) 10 MG troche Take 1 tablet (10 mg total) by mouth 5 (five) times daily.   Cyanocobalamin (B-12 COMPLIANCE INJECTION IJ) Inject 1 Dose as directed every 30 (thirty) days.   cyclobenzaprine (FLEXERIL) 5 MG tablet Take 1 tablet (5 mg total) by mouth 3 (three) times daily as needed for muscle spasms.   DULoxetine (CYMBALTA) 60 MG capsule Take 1 capsule by mouth daily.   gabapentin (NEURONTIN) 300 MG capsule Take 1 capsule (300 mg total) by mouth 3 (three) times daily.   omeprazole (PRILOSEC) 40 MG capsule TAKE 1 CAPSULE BY MOUTH EVERY DAY (MORNING)   ondansetron (ZOFRAN) 4 MG tablet Take 1 tablet (4 mg total) by mouth every 8 (eight) hours as needed for nausea or vomiting.   oxyCODONE (ROXICODONE) 5 MG immediate release tablet Take 1 tablet (5 mg total) by mouth every 8 (eight) hours as needed.   phenazopyridine (PYRIDIUM) 200 MG tablet Take 1 tablet (200 mg total) by mouth 3 (three) times daily as needed for pain.   rOPINIRole (REQUIP) 0.25 MG tablet Take 0.25 mg by mouth 3 (three) times daily.    Facility-Administered Encounter Medications as of 03/02/2020  Medication   cyanocobalamin ((VITAMIN B-12)) injection 1,000 mcg   cyanocobalamin ((VITAMIN B-12)) injection 1,000 mcg   cyanocobalamin ((VITAMIN B-12)) injection 1,000 mcg   cyanocobalamin ((VITAMIN B-12)) injection 1,000 mcg    Allergies (verified) Patient has no known allergies.   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   Cough 03/02/2017   INTERMTTENT DRY COUGH   Depression    GERD (gastroesophageal reflux disease)    History of hiatal hernia    Personal history of chemotherapy    Personal history of radiation  therapy    Skin cancer    Throat cancer ([place]) 1998   radiation   Past Surgical History:  Procedure Laterality Date   BREAST BIOPSY Right 1997   positive   BREAST BIOPSY Right 11/15/2017   INVASIVE MAMMARY CARCINOMA triple negative   BREAST LUMPECTOMY Right 1997   2019 also   BREAST LUMPECTOMY WITH SENTINEL LYMPH NODE BIOPSY Right 12/08/2017   Procedure: BREAST LUMPECTOMY WITH SENTINEL LYMPH NODE BX;  Surgeon: Robert Bellow, MD;  Location: [facility] ORS;  Service: General;  Laterality: Right;   BREAST REDUCTION WITH MASTOPEXY Left 06/10/2019   Procedure: Left breast mastopexy reduction for symmetry;  Surgeon: Wallace Going, DO;  Location: [place];  Service: Plastics;  Laterality: Left;  total 2.5 hours   COLONOSCOPY  2015   COLONOSCOPY WITH PROPOFOL N/A 10/29/2019   Procedure: COLONOSCOPY WITH PROPOFOL;  Surgeon: Lucilla Lame, MD;  Location: [facility] ENDOSCOPY;  Service: Endoscopy;  Laterality: N/A;   ESOPHAGOGASTRODUODENOSCOPY (EGD) WITH PROPOFOL N/A 05/24/2019   Procedure: ESOPHAGOGASTRODUODENOSCOPY (EGD) WITH PROPOFOL;  Surgeon: Lucilla Lame, MD;  Location: [facility] ENDOSCOPY;  Service: Endoscopy;  Laterality: N/A;   ESOPHAGUS SURGERY     KNEE SURGERY Right    LIPOSUCTION WITH LIPOFILLING Right 06/10/2019   Procedure: right breast scar and capsule release with fat grafting;  Surgeon: Wallace Going, DO;  Location: [place];  Service: Plastics;  Laterality: Right;   PORT-A-CATH REMOVAL     PORTACATH PLACEMENT Right 01/17/2018   Procedure: INSERTION PORT-A-CATH- RIGHT;  Surgeon: Robert Bellow, MD;  Location: [facility] ORS;  Service: General;  Laterality: Right;   ROBOTIC ADRENALECTOMY Right 12/28/2016   Procedure: ROBOTIC ADRENALECTOMY;  Surgeon: Hollice Espy, MD;  Location: [facility] ORS;  Service: Urology;  Laterality: Right;   SKIN CANCER EXCISION     THROAT SURGERY  1998   throat cancer    TUBAL LIGATION     VENTRAL HERNIA REPAIR N/A 03/03/2017    Procedure: HERNIA REPAIR VENTRAL ADULT;  Surgeon: Clayburn Pert, MD;  Location: [facility] ORS;  Service: General;  Laterality: N/A;   Family History  Problem Relation Age of Onset   Diabetes Mother    Heart attack Mother    Cancer Sister        cancer of eyes, spread to lung, other places. died at 38   Cancer Sister 55       unknown primary, spread to bone, brain died in 6's   Cancer Paternal Aunt        unk type   Cancer Paternal Grandmother        type unk   Prostate cancer Neg Hx    Kidney cancer Neg Hx    Bladder Cancer Neg Hx    Social History   Socioeconomic History   Marital status: Married    Spouse name: Not on file   Number of children: 4   Years of education: Not on file   Highest education level: 10th grade  Occupational History   Occupation: retired  Tobacco Use  Smoking status: Former Smoker    Packs/day: 1.50    Years: 30.00    Pack years: 45.00    Types: Cigarettes    Quit date: 05/02/1996    Years since quitting: 23.8   Smokeless tobacco: Never Used  Vaping Use   Vaping Use: Never used  Substance and Sexual Activity   Alcohol use: Yes    Alcohol/week: 4.0 standard drinks    Types: 4 Shots of liquor per week    Comment: 2 per night on weekends   Drug use: No   Sexual activity: Not on file  Other Topics Concern   Not on file  Social History Narrative   Not on file   Social Determinants of Health   Financial Resource Strain: Low Risk    Difficulty of Paying Living Expenses: Not hard at all  Food Insecurity: No Food Insecurity   Worried About Charity fundraiser in the Last Year: Never true   [place] in the Last Year: Never true  Transportation Needs: No Transportation Needs   Lack of Transportation (Medical): No   Lack of Transportation (Non-Medical): No  Physical Activity: Insufficiently Active   Days of Exercise per Week: 3 days   Minutes of Exercise per Session: 30 min  Stress: Stress Concern Present   Feeling of Stress : Rather  much  Social Connections: Moderately Isolated   Frequency of Communication with Friends and Family: More than three times a week   Frequency of Social Gatherings with Friends and Family: More than three times a week   Attends Religious Services: Never   Marine scientist or Organizations: No   Attends Music therapist: Never   Marital Status: Married    Tobacco Counseling Counseling given: Not Answered   Clinical Intake:  Pre-visit preparation completed: Yes  Pain : No/denies pain     Nutritional Risks: None Diabetes: No  How often do you need to have someone help you when you read instructions, pamphlets, or other written materials from your doctor or pharmacy?: 1 - Never  Diabetic? No  Interpreter Needed?: No  Information entered by :: [facility], LPN   Activities of Daily Living In your present state of health, do you have any difficulty performing the following activities: 03/02/2020 06/10/2019  Hearing? N N  Vision? Y N  Comment Due to cataracts. -  Difficulty concentrating or making decisions? N N  Walking or climbing stairs? Y N  Comment Due to SOB and arthritis pains. -  Dressing or bathing? N N  Doing errands, shopping? N -  Preparing Food and eating ? N -  Using the Toilet? N -  In the past six months, have you accidently leaked urine? Y -  Comment Wears protection daily. -  Do you have problems with loss of bowel control? N -  Managing your Medications? N -  Managing your Finances? N -  Housekeeping or managing your Housekeeping? N -  Some recent data might be hidden    Patient Care Team: Chrismon, Vickki Muff, PA as PCP - General (Physician Assistant) Sindy Guadeloupe, MD as Consulting Physician (Oncology) Jules Husbands, MD as Consulting Physician (General Surgery) Dasher, Rayvon Char, MD (Dermatology) Scheeler, Carola Rhine, PA-C as Physician Assistant (Plastic Surgery) Harvest Dark, MD as Attending Physician (Emergency  Medicine) Gayland Curry Hubbard Hartshorn (Neurology) Marlowe Sax, MD as Referring Physician (Internal Medicine)  Indicate any recent Medical Services you may have received from other than [facility] providers  in the past year (date may be approximate).     Assessment:   This is a routine wellness examination for Mada.  Hearing/Vision screen No exam data present  Dietary issues and exercise activities discussed: Current Exercise Habits: Home exercise routine, Type of exercise: walking, Time (Minutes): 30, Frequency (Times/Week): 3, Weekly Exercise (Minutes/Week): 90, Intensity: Mild, Exercise limited by: respiratory conditions(s);orthopedic condition(s)  Goals      DIET - INCREASE WATER INTAKE     Recommend to drink at least 6-8 8oz glasses of water per day.       Depression Screen PHQ 2/9 Scores 03/02/2020 02/15/2019 12/04/2017 04/03/2017 12/27/2016  PHQ - 2 Score 1 2 0 0 4  PHQ- 9 Score - 5 - - 8    Fall Risk Fall Risk  03/02/2020 01/20/2020 01/08/2020 12/30/2019 12/30/2019  Falls in the past year? 0 0 0 0 0  Number falls in past yr: 0 0 0 0 0  Injury with Fall? 0 0 0 0 0  Follow up - - - - -    Any stairs in or around the home? Yes  If so, are there any without handrails? No  Home free of loose throw rugs in walkways, pet beds, electrical cords, etc? Yes  Adequate lighting in your home to reduce risk of falls? Yes   ASSISTIVE DEVICES UTILIZED TO PREVENT FALLS:  Life alert? No  Use of a cane, walker or w/c? No  Grab bars in the bathroom? Yes  Shower chair or bench in shower? No  Elevated toilet seat or a handicapped toilet? No    Cognitive Function: Declined today.        Immunizations Immunization History  Administered Date(s) Administered   Fluad Quad(high Dose 65+) 01/31/2019, 02/20/2020   Influenza,inj,Quad PF,6+ Mos 03/02/2017, 06/08/2018   PFIZER SARS-COV-2 Vaccination 07/05/2019, 07/26/2019    TDAP status: Due, Education has been provided regarding the  importance of this vaccine. Advised may receive this vaccine at local pharmacy or Health Dept. Aware to provide a copy of the vaccination record if obtained from local pharmacy or Health Dept. Verbalized acceptance and understanding. Flu Vaccine status: Up to date Pneumococcal vaccine status: Declined,  Education has been provided regarding the importance of this vaccine but patient still declined. Advised may receive this vaccine at local pharmacy or Health Dept. Aware to provide a copy of the vaccination record if obtained from local pharmacy or Health Dept. Verbalized acceptance and understanding.  Covid-19 vaccine status: Completed vaccines  Qualifies for Shingles Vaccine? Yes   Zostavax completed No   Shingrix Completed?: No.    Education has been provided regarding the importance of this vaccine. Patient has been advised to call insurance company to determine out of pocket expense if they have not yet received this vaccine. Advised may also receive vaccine at local pharmacy or Health Dept. Verbalized acceptance and understanding.  Screening Tests Health Maintenance  Topic Date Due   Hepatitis C Screening  Never done   DEXA SCAN  Never done   PNA vac Low Risk Adult (1 of 2 - PCV13) Never done   TETANUS/TDAP  03/02/2021 (Originally 08/20/1974)   MAMMOGRAM  12/15/2021   COLONOSCOPY  10/29/2022   INFLUENZA VACCINE  Completed   COVID-19 Vaccine  Completed    Health Maintenance  Health Maintenance Due  Topic Date Due   Hepatitis C Screening  Never done   DEXA SCAN  Never done   PNA vac Low Risk Adult (1 of 2 - PCV13)  Never done    Colorectal cancer screening: Completed 10/29/19. Repeat every 3 years Mammogram status: Completed 12/16/19. Repeat every year Bone Density status: Ordered today. Pt provided with contact info and advised to call to schedule appt.  Lung Cancer Screening: (Low Dose CT Chest recommended if Age 50-80 years, 30 pack-year currently smoking OR have quit w/in  15years.) does not qualify.    Additional Screening:  Hepatitis C Screening: does qualify and would like this completed at next in office apt.   Vision Screening: Recommended annual ophthalmology exams for early detection of glaucoma and other disorders of the eye. Is the patient up to date with their annual eye exam? No Who is the provider or what is the name of the office in which the patient attends annual eye exams? N/A If pt is not established with a provider, would they like to be referred to a provider to establish care? No .   Dental Screening: Recommended annual dental exams for proper oral hygiene  Community Resource Referral / Chronic Care Management: CRR required this visit?  No   CCM required this visit?  No      Plan:     I have personally reviewed and noted the following in the patient's chart:   Medical and social history Use of alcohol, tobacco or illicit drugs  Current medications and supplements Functional ability and status Nutritional status Physical activity Advanced directives List of other physicians Hospitalizations, surgeries, and ER visits in previous 12 months Vitals Screenings to include cognitive, depression, and falls Referrals and appointments  In addition, I have reviewed and discussed with patient certain preventive protocols, quality metrics, and best practice recommendations. A written personalized care plan for preventive services as well as general preventive health recommendations were provided to patient.     Kaede Clendenen [place], [place]   06/08/3783   Nurse Notes: Pt would like to receive the Prevnar 13 vaccine at the next in office apt. Pt would also like to have the Hep C lab completed then.  Reviewed Nurse Health Advisor note and plan. Was available for consultation during screening. Agree with documentation and recommendations.

## 2020-03-02 ENCOUNTER — Other Ambulatory Visit: Payer: Self-pay

## 2020-03-02 ENCOUNTER — Ambulatory Visit (INDEPENDENT_AMBULATORY_CARE_PROVIDER_SITE_OTHER): Payer: Medicare HMO

## 2020-03-02 DIAGNOSIS — Z0000 Encounter for general adult medical examination without abnormal findings: Secondary | ICD-10-CM

## 2020-03-02 DIAGNOSIS — E2839 Other primary ovarian failure: Secondary | ICD-10-CM | POA: Diagnosis not present

## 2020-03-02 NOTE — Patient Instructions (Signed)
Denise Watts , Thank you for taking time to come for your Medicare Wellness Visit. I appreciate your ongoing commitment to your health goals. Please review the following plan we discussed and let me know if I can assist you in the future.   Screening recommendations/referrals: Colonoscopy: Up to date, due 10/2022 Mammogram: Up to date, due 11/2020 Bone Density: Ordered today. Pt provided with contact info and advised to call to schedule appt. Recommended yearly ophthalmology/optometry visit for glaucoma screening and checkup Recommended yearly dental visit for hygiene and checkup  Vaccinations: Influenza vaccine: Done 02/26/20 Pneumococcal vaccine: Currently due, will receive at next in office apt.  Tdap vaccine: Currently due, declined receiving at this time.  Shingles vaccine: Shingrix discussed. Please contact your pharmacy for coverage information.     Advanced directives: Advance directive discussed with you today. Even though you declined this today please call our office should you change your mind and we can give you the proper paperwork for you to fill out.  Conditions/risks identified: Recommend to drink at least 6-8 8oz glasses of water per day.  Next appointment: 03/03/20 @ 10:00 AM with [facility] 65 Years and Older, Female Preventive care refers to lifestyle choices and visits with your health care provider that can promote health and wellness. What does preventive care include?  A yearly physical exam. This is also called an annual well check.  Dental exams once or twice a year.  Routine eye exams. Ask your health care provider how often you should have your eyes checked.  Personal lifestyle choices, including:  Daily care of your teeth and gums.  Regular physical activity.  Eating a healthy diet.  Avoiding tobacco and drug use.  Limiting alcohol use.  Practicing safe sex.  Taking low-dose aspirin every day.  Taking vitamin and mineral  supplements as recommended by your health care provider. What happens during an annual well check? The services and screenings done by your health care provider during your annual well check will depend on your age, overall health, lifestyle risk factors, and family history of disease. Counseling  Your health care provider may ask you questions about your:  Alcohol use.  Tobacco use.  Drug use.  Emotional well-being.  Home and relationship well-being.  Sexual activity.  Eating habits.  History of falls.  Memory and ability to understand (cognition).  Work and work Statistician.  Reproductive health. Screening  You may have the following tests or measurements:  Height, weight, and BMI.  Blood pressure.  Lipid and cholesterol levels. These may be checked every 5 years, or more frequently if you are over 64 years old.  Skin check.  Lung cancer screening. You may have this screening every year starting at age 41 if you have a 30-pack-year history of smoking and currently smoke or have quit within the past 15 years.  Fecal occult blood test (FOBT) of the stool. You may have this test every year starting at age 6.  Flexible sigmoidoscopy or colonoscopy. You may have a sigmoidoscopy every 5 years or a colonoscopy every 10 years starting at age 57.  Hepatitis C blood test.  Hepatitis B blood test.  Sexually transmitted disease (STD) testing.  Diabetes screening. This is done by checking your blood sugar (glucose) after you have not eaten for a while (fasting). You may have this done every 1-3 years.  Bone density scan. This is done to screen for osteoporosis. You may have this done starting at age 59.  Mammogram. This may be done every 1-2 years. Talk to your health care provider about how often you should have regular mammograms. Talk with your health care provider about your test results, treatment options, and if necessary, the need for more tests. Vaccines  Your  health care provider may recommend certain vaccines, such as:  Influenza vaccine. This is recommended every year.  Tetanus, diphtheria, and acellular pertussis (Tdap, Td) vaccine. You may need a Td booster every 10 years.  Zoster vaccine. You may need this after age 51.  Pneumococcal 13-valent conjugate (PCV13) vaccine. One dose is recommended after age 88.  Pneumococcal polysaccharide (PPSV23) vaccine. One dose is recommended after age 26. Talk to your health care provider about which screenings and vaccines you need and how often you need them. This information is not intended to replace advice given to you by your health care provider. Make sure you discuss any questions you have with your health care provider. Document Released: 05/15/2015 Document Revised: 01/06/2016 Document Reviewed: 02/17/2015 Elsevier Interactive Patient Education  2017 [place] Prevention in the Home Falls can cause injuries. They can happen to people of all ages. There are many things you can do to make your home safe and to help prevent falls. What can I do on the outside of my home?  Regularly fix the edges of walkways and driveways and fix any cracks.  Remove anything that might make you trip as you walk through a door, such as a raised step or threshold.  Trim any bushes or trees on the path to your home.  Use bright outdoor lighting.  Clear any walking paths of anything that might make someone trip, such as rocks or tools.  Regularly check to see if handrails are loose or broken. Make sure that both sides of any steps have handrails.  Any raised decks and porches should have guardrails on the edges.  Have any leaves, snow, or ice cleared regularly.  Use sand or salt on walking paths during winter.  Clean up any spills in your garage right away. This includes oil or grease spills. What can I do in the bathroom?  Use night lights.  Install grab bars by the toilet and in the tub and  shower. Do not use towel bars as grab bars.  Use non-skid mats or decals in the tub or shower.  If you need to sit down in the shower, use a plastic, non-slip stool.  Keep the floor dry. Clean up any water that spills on the floor as soon as it happens.  Remove soap buildup in the tub or shower regularly.  Attach bath mats securely with double-sided non-slip rug tape.  Do not have throw rugs and other things on the floor that can make you trip. What can I do in the bedroom?  Use night lights.  Make sure that you have a light by your bed that is easy to reach.  Do not use any sheets or blankets that are too big for your bed. They should not hang down onto the floor.  Have a firm chair that has side arms. You can use this for support while you get dressed.  Do not have throw rugs and other things on the floor that can make you trip. What can I do in the kitchen?  Clean up any spills right away.  Avoid walking on wet floors.  Keep items that you use a lot in easy-to-reach places.  If you need to reach  something above you, use a strong step stool that has a grab bar.  Keep electrical cords out of the way.  Do not use floor polish or wax that makes floors slippery. If you must use wax, use non-skid floor wax.  Do not have throw rugs and other things on the floor that can make you trip. What can I do with my stairs?  Do not leave any items on the stairs.  Make sure that there are handrails on both sides of the stairs and use them. Fix handrails that are broken or loose. Make sure that handrails are as long as the stairways.  Check any carpeting to make sure that it is firmly attached to the stairs. Fix any carpet that is loose or worn.  Avoid having throw rugs at the top or bottom of the stairs. If you do have throw rugs, attach them to the floor with carpet tape.  Make sure that you have a light switch at the top of the stairs and the bottom of the stairs. If you do not  have them, ask someone to add them for you. What else can I do to help prevent falls?  Wear shoes that:  Do not have high heels.  Have rubber bottoms.  Are comfortable and fit you well.  Are closed at the toe. Do not wear sandals.  If you use a stepladder:  Make sure that it is fully opened. Do not climb a closed stepladder.  Make sure that both sides of the stepladder are locked into place.  Ask someone to hold it for you, if possible.  Clearly mark and make sure that you can see:  Any grab bars or handrails.  First and last steps.  Where the edge of each step is.  Use tools that help you move around (mobility aids) if they are needed. These include:  Canes.  Walkers.  Scooters.  Crutches.  Turn on the lights when you go into a dark area. Replace any light bulbs as soon as they burn out.  Set up your furniture so you have a clear path. Avoid moving your furniture around.  If any of your floors are uneven, fix them.  If there are any pets around you, be aware of where they are.  Review your medicines with your doctor. Some medicines can make you feel dizzy. This can increase your chance of falling. Ask your doctor what other things that you can do to help prevent falls. This information is not intended to replace advice given to you by your health care provider. Make sure you discuss any questions you have with your health care provider. Document Released: 02/12/2009 Document Revised: 09/24/2015 Document Reviewed: 05/23/2014 Elsevier Interactive Patient Education  2017 [employer].

## 2020-03-03 ENCOUNTER — Ambulatory Visit (INDEPENDENT_AMBULATORY_CARE_PROVIDER_SITE_OTHER): Payer: Medicare HMO | Admitting: Family Medicine

## 2020-03-03 ENCOUNTER — Encounter: Payer: Self-pay | Admitting: Family Medicine

## 2020-03-03 ENCOUNTER — Other Ambulatory Visit: Payer: Self-pay

## 2020-03-03 ENCOUNTER — Ambulatory Visit: Payer: Medicare HMO | Admitting: Family Medicine

## 2020-03-03 VITALS — BP 110/65 | HR 109 | Temp 98.0°F | Resp 16 | Ht 61.0 in | Wt 141.0 lb

## 2020-03-03 DIAGNOSIS — C4490 Unspecified malignant neoplasm of skin, unspecified: Secondary | ICD-10-CM | POA: Diagnosis not present

## 2020-03-03 DIAGNOSIS — F439 Reaction to severe stress, unspecified: Secondary | ICD-10-CM | POA: Diagnosis not present

## 2020-03-03 DIAGNOSIS — Z853 Personal history of malignant neoplasm of breast: Secondary | ICD-10-CM

## 2020-03-03 DIAGNOSIS — Z23 Encounter for immunization: Secondary | ICD-10-CM | POA: Diagnosis not present

## 2020-03-03 DIAGNOSIS — E538 Deficiency of other specified B group vitamins: Secondary | ICD-10-CM | POA: Diagnosis not present

## 2020-03-03 DIAGNOSIS — Z85819 Personal history of malignant neoplasm of unspecified site of lip, oral cavity, and pharynx: Secondary | ICD-10-CM | POA: Diagnosis not present

## 2020-03-03 MED ORDER — BUSPIRONE HCL 5 MG PO TABS: 5.0000 mg | ORAL_TABLET | Freq: Two times a day (BID) | ORAL | 0 refills | Status: DC

## 2020-03-03 NOTE — Progress Notes (Signed)
Established patient visit   Patient: Denise Watts   DOB: Oct 14, 1955   64 y.o. Female  MRN: [id number] Visit Date: 03/03/2020  Today's healthcare provider: Vernie Murders, PA   Chief Complaint  Patient presents with   Cough   Subjective     HPI    Onset 5 weeks--SOB, HA tested negative for Covid --last week   Last edited by Jeanelle Malling, CMA on 03/03/2020  1:33 PM. 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   Cough 03/02/2017   INTERMTTENT DRY COUGH   Depression    GERD (gastroesophageal reflux disease)    History of hiatal hernia    Personal history of chemotherapy    Personal history of radiation therapy    Skin cancer    Throat cancer ([place]) 1998   radiation   Past Surgical History:  Procedure Laterality Date   BREAST BIOPSY Right 1997   positive   BREAST BIOPSY Right 11/15/2017   INVASIVE MAMMARY CARCINOMA triple negative   BREAST LUMPECTOMY Right 1997   2019 also   BREAST LUMPECTOMY WITH SENTINEL LYMPH NODE BIOPSY Right 12/08/2017   Procedure: BREAST LUMPECTOMY WITH SENTINEL LYMPH NODE BX;  Surgeon: Robert Bellow, MD;  Location: [facility];  Service: General;  Laterality: Right;   BREAST REDUCTION WITH MASTOPEXY Left 06/10/2019   Procedure: Left breast mastopexy reduction for symmetry;  Surgeon: Wallace Going, DO;  Location: [facility];  Service: Plastics;  Laterality: Left;  total 2.5 hours   COLONOSCOPY  2015   COLONOSCOPY WITH PROPOFOL N/A 10/29/2019   Procedure: COLONOSCOPY WITH PROPOFOL;  Surgeon: Lucilla Lame, MD;  Location: [facility];  Service: Endoscopy;  Laterality: N/A;   ESOPHAGOGASTRODUODENOSCOPY (EGD) WITH PROPOFOL N/A 05/24/2019   Procedure:  ESOPHAGOGASTRODUODENOSCOPY (EGD) WITH PROPOFOL;  Surgeon: Lucilla Lame, MD;  Location: [facility] END[facility]COPY;  Service: Endoscopy;  Laterality: N/A;   ESOPHAGUS SURGERY     KNEE SURGERY Right    LIPOSUCTION WITH LIPOFILLING Right 06/10/2019   Procedure: right breast scar and capsule release with fat grafting;  Surgeon: Wallace Going, DO;  Location: [place];  Service: Plastics;  Laterality: Right;   PORT-A-CATH REMOVAL     PORTACATH PLACEMENT Right 01/17/2018   Procedure: INSERTION PORT-A-CATH- RIGHT;  Surgeon: Robert Bellow, MD;  Location: [facility];  Service: General;  Laterality: Right;   ROBOTIC ADRENALECTOMY Right 12/28/2016   Procedure: ROBOTIC ADRENALECTOMY;  Surgeon: Hollice Espy, MD;  Location: [facility];  Service: Urology;  Laterality: Right;   SKIN CANCER EXCISION     THROAT SURGERY  1998   throat cancer    TUBAL LIGATION     VENTRAL HERNIA REPAIR N/A 03/03/2017   Procedure: HERNIA REPAIR VENTRAL ADULT;  Surgeon: Clayburn Pert, MD;  Location: [facility];  Service: General;  Laterality: N/A;   Social History   Tobacco Use   Smoking status: Former Smoker    Packs/day: 1.50    Years: 30.00    Pack years: 45.00    Types: Cigarettes    Quit date: 05/02/1996    Years since quitting: 23.8   Smokeless tobacco: Never Used  Vaping Use   Vaping Use: Never  used  Substance Use Topics   Alcohol use: Yes    Alcohol/week: 4.0 standard drinks    Types: 4 Shots of liquor per week    Comment: 2 per night on weekends   Drug use: No   Family Status  Relation Name Status   Mother  Deceased at age 72   Father  Deceased at age 64       1   Sister  Deceased   Sister  Deceased   Field seismologist  (Not Specified)   MGM  Deceased   MGF  Deceased   PGM  Deceased   PGF  Deceased   Sister mat half Alive   Neg Hx  (Not Specified)   No Known Allergies     Medications: Outpatient Medications Prior to Visit  Medication Sig   albuterol (VENTOLIN  HFA) 108 (90 Base) MCG/ACT inhaler Inhale 2 puffs into the lungs every 6 (six) hours as needed for wheezing or shortness of breath.   benzonatate (TESSALON) 100 MG capsule Take 1 capsule (100 mg total) by mouth 3 (three) times daily.   cetirizine (ZYRTEC) 10 MG tablet Take 10 mg by mouth daily.   clotrimazole (MYCELEX) 10 MG troche Take 1 tablet (10 mg total) by mouth 5 (five) times daily.   Cyanocobalamin (B-12 COMPLIANCE INJECTION IJ) Inject 1 Dose as directed every 30 (thirty) days.   cyclobenzaprine (FLEXERIL) 5 MG tablet Take 1 tablet (5 mg total) by mouth 3 (three) times daily as needed for muscle spasms.   DULoxetine (CYMBALTA) 60 MG capsule Take 1 capsule by mouth daily.   gabapentin (NEURONTIN) 300 MG capsule Take 1 capsule (300 mg total) by mouth 3 (three) times daily.   omeprazole (PRILOSEC) 40 MG capsule TAKE 1 CAPSULE BY MOUTH EVERY DAY (MORNING)   ondansetron (ZOFRAN) 4 MG tablet Take 1 tablet (4 mg total) by mouth every 8 (eight) hours as needed for nausea or vomiting.   oxyCODONE (ROXICODONE) 5 MG immediate release tablet Take 1 tablet (5 mg total) by mouth every 8 (eight) hours as needed.   rOPINIRole (REQUIP) 0.25 MG tablet Take 0.25 mg by mouth 3 (three) times daily.    phenazopyridine (PYRIDIUM) 200 MG tablet Take 1 tablet (200 mg total) by mouth 3 (three) times daily as needed for pain. (Patient not taking: Reported on 03/03/2020)   Facility-Administered Medications Prior to Visit  Medication Dose Route Frequency Provider   cyanocobalamin ((VITAMIN B-12)) injection 1,000 mcg  1,000 mcg Intramuscular Q30 days Sindy Guadeloupe, MD   cyanocobalamin ((VITAMIN B-12)) injection 1,000 mcg  1,000 mcg Intramuscular Q30 days Sindy Guadeloupe, MD   cyanocobalamin ((VITAMIN B-12)) injection 1,000 mcg  1,000 mcg Intramuscular Q30 days Sindy Guadeloupe, MD   cyanocobalamin ((VITAMIN B-12)) injection 1,000 mcg  1,000 mcg Intramuscular Q30 days Sindy Guadeloupe, MD    Review of  Systems  Last CBC Lab Results  Component Value Date   WBC 5.2 02/20/2020   HGB 14.1 02/20/2020   HCT 42.2 02/20/2020   MCV 100.2 (H) 02/20/2020   MCH 33.5 02/20/2020   RDW 12.3 02/20/2020   PLT 249 79/39/0300   Last metabolic panel Lab Results  Component Value Date   GLUCOSE 94 02/20/2020   NA 139 02/20/2020   K 4.2 02/20/2020   CL 102 02/20/2020   CO2 29 02/20/2020   BUN 12 02/20/2020   CREATININE 0.52 02/20/2020   GFRNONAA >60 02/20/2020   GFRAA >60 01/20/2020   CALCIUM 8.5 (L) 02/20/2020  PROT 6.4 (L) 02/20/2020   ALBUMIN 3.8 02/20/2020   LABGLOB 2.3 04/03/2017   AGRATIO 1.9 04/03/2017   BILITOT 0.6 02/20/2020   ALKPHOS 66 02/20/2020   AST 20 02/20/2020   ALT 14 02/20/2020   ANIONGAP 8 02/20/2020      Objective    BP 110/65    Pulse (!) 109    Temp 98 F (36.7 C) (Oral)    Resp 16    Ht 5\' 1"  (1.549 m)    Wt 141 lb (64 kg)    SpO2 99%    BMI 26.64 kg/m  BP Readings from Last 3 Encounters:  03/03/20 110/65  02/20/20 95/83  02/19/20 111/75   Wt Readings from Last 3 Encounters:  03/03/20 141 lb (64 kg)  02/20/20 137 lb 11.2 oz (62.5 kg)  01/20/20 130 lb 1.1 oz (59 kg)   Physical Exam Constitutional:      Appearance: She is well-developed.  HENT:     Head: Normocephalic and atraumatic.     Right Ear: External ear normal.     Left Ear: External ear normal.     Nose: Nose normal.  Eyes:     General:        Right eye: No discharge.     Conjunctiva/sclera: Conjunctivae normal.     Pupils: Pupils are equal, round, and reactive to light.  Neck:     Thyroid: No thyromegaly.     Trachea: No tracheal deviation.  Cardiovascular:     Rate and Rhythm: Normal rate and regular rhythm.     Heart sounds: Normal heart sounds. No murmur heard.   Pulmonary:     Effort: Pulmonary effort is normal. No respiratory distress.     Breath sounds: Normal breath sounds. No wheezing or rales.  Chest:     Chest wall: No tenderness.  Abdominal:     General: There is no  distension.     Palpations: Abdomen is soft. There is no mass.     Tenderness: There is no abdominal tenderness. There is no guarding or rebound.  Musculoskeletal:        General: No tenderness. Normal range of motion.     Cervical back: Normal range of motion and neck supple.  Lymphadenopathy:     Cervical: No cervical adenopathy.  Skin:    General: Skin is warm and dry.     Findings: No erythema or rash.     Comments: Many scars and scabs from recent dermatologist excision of skin cancer on both calves, forearms, back and upper chest.  Neurological:     Mental Status: She is alert and oriented to person, place, and time.     Cranial Nerves: No cranial nerve deficit.     Motor: No abnormal muscle tone.     Coordination: Coordination normal.     Deep Tendon Reflexes: Reflexes are normal and symmetric. Reflexes normal.  Psychiatric:        Behavior: Behavior normal.        Thought Content: Thought content normal.        Judgment: Judgment normal.      No results found for any visits on 03/03/20.  Assessment & Plan     1. Situational stress Very anxious and sad about husband's alcoholism. States he is up to 5-7 cases of beer a week and blaming her for most everything. She can't get him to go anywhere for help or just a medical evaluation. Will give her some Buspar to help  with anxiety. She does not appear suicidal or depressed. Continues to take Duloxetine daily. May need to seek counseling for herself if he won't go. - busPIRone (BUSPAR) 5 MG tablet; Take 1 tablet (5 mg total) by mouth 2 (two) times daily.  Dispense: 60 tablet; Refill: 0  2. History of right breast cancer Carcinoma of right breast, T1, N0, M0 tumor estrogen receptor negative diagnosis in January of 1997. Had lumpectomy in 1997 that recurred and had lumpectomy with sentinel node biopsy in 2019. (surgeon - Dr. Bary Castilla). Followed by oncologist (Dr. Janese Banks).  3. History of throat cancer Has esophageal stricture and  scarring from radiation treatment of carcinoma of the vocal cord T1, N0, M0 tumor  4. B12 deficiency Followed by Dr. Janese Banks (oncologist) and given B12 injection once a month. Suspect secondary to radiation and chemotherapy for breast cancer, throat cancer, vocal chord cancer, adrenal cancer, etc.  5. Skin cancer Followed by dermatologist (Dr. Evorn Gong) for several squamous cell skin cancers.  6. Need for prophylactic vaccination against Streptococcus pneumoniae (pneumococcus) Need to schedule for Prevnar but remembered she got her flu shot just a couple days ago. Recommend she postpone for 2-3 week.   No follow-ups on file.      Andres Shad, PA, have reviewed all documentation for this visit. The documentation on 03/03/20 for the exam, diagnosis, procedures, and orders are all accurate and complete.    Vernie Murders, [place] [phone] (phone) [phone] (fax)  [place]

## 2020-03-06 ENCOUNTER — Ambulatory Visit: Payer: Medicare HMO | Admitting: Radiation Oncology

## 2020-03-09 ENCOUNTER — Ambulatory Visit
Admission: RE | Admit: 2020-03-09 | Discharge: 2020-03-09 | Disposition: A | Discharge status: Home / Self Care | Payer: Medicare HMO | Source: Ambulatory Visit | Attending: Radiation Oncology | Admitting: Radiation Oncology

## 2020-03-09 ENCOUNTER — Encounter: Payer: Self-pay | Admitting: Radiation Oncology

## 2020-03-09 VITALS — BP 113/78 | HR 114 | Temp 98.0°F | Wt 137.0 lb

## 2020-03-09 DIAGNOSIS — Z853 Personal history of malignant neoplasm of breast: Secondary | ICD-10-CM | POA: Diagnosis not present

## 2020-03-09 DIAGNOSIS — Z8521 Personal history of malignant neoplasm of larynx: Secondary | ICD-10-CM | POA: Diagnosis not present

## 2020-03-09 DIAGNOSIS — Z923 Personal history of irradiation: Secondary | ICD-10-CM | POA: Diagnosis not present

## 2020-03-09 DIAGNOSIS — C50511 Malignant neoplasm of lower-outer quadrant of right female breast: Secondary | ICD-10-CM

## 2020-03-09 NOTE — Progress Notes (Signed)
Radiation Oncology Follow up Note  Name: Denise Watts   Date:   03/09/2020 MRN:  [id number] DOB: 1955-06-23    This 64 y.o. female presents to the clinic today for 2-year follow-up status post radiation to her right breast for recurrent invasive mammary carcinoma previously treated back in 1987.Marland Kitchen  REFERRING PROVIDER: Margo Common, PA  HPI: Patient is a 64 year old female now out 2 years having completed salvage radiation therapy to her right breast status post recurrent invasive mammary carcinoma.  She previously been treated with wide local excision radiation back in 1997.  We radiated her breast and she is done well..  She had mammograms back in August which I have reviewed were BI-RADS 2 benign.  She has developed some thick firm nodular densities in her anterior neck consistent with her previous treatment fields for laryngeal carcinoma.  I have asked her to have that biopsied by her dermatologist.  COMPLICATIONS OF TREATMENT: none  FOLLOW UP COMPLIANCE: keeps appointments   PHYSICAL EXAM:  BP 113/78 (BP Location: Left Arm)   Pulse (!) 114   Temp 98 F (36.7 C) (Tympanic)   Wt 137 lb (62.1 kg)   BMI 25.89 kg/m  Patient has reconstructed right breast.  No dominant mass or nodularity is noted in either breast.  No axillary or supraclavicular adenopathy is identified.  Does have multiple nodular densities in her anterior neck.  No evidence of cervical adenopathy.  Well-developed well-nourished patient in NAD. HEENT reveals PERLA, EOMI, discs not visualized.  Oral cavity is clear. No oral mucosal lesions are identified. Neck is clear without evidence of cervical or supraclavicular adenopathy. Lungs are clear to A&P. Cardiac examination is essentially unremarkable with regular rate and rhythm without murmur rub or thrill. Abdomen is benign with no organomegaly or masses noted. Motor sensory and DTR levels are equal and symmetric in the upper and lower extremities. Cranial nerves II  through XII are grossly intact. Proprioception is intact. No peripheral adenopathy or edema is identified. No motor or sensory levels are noted. Crude visual fields are within normal range.  RADIOLOGY RESULTS: Mammograms reviewed compatible with above-stated findings  PLAN: Present time patient is doing well from a breast standpoint with no evidence of disease.  I am pleased with her overall progress.  I am somewhat concerned about the nodular densities in the anterior part of her neck for which I have asked her dermatologist to biopsy the home in about 2 weeks when she sees him again.  All to spacing those reports.  Otherwise have asked to see her back in 1 year for follow-up.  Patient knows to call with any concerns.  I would like to take this opportunity to thank you for allowing me to participate in the care of your patient.Noreene Filbert, MD

## 2020-03-23 ENCOUNTER — Ambulatory Visit: Payer: Self-pay | Admitting: Surgery

## 2020-04-13 ENCOUNTER — Telehealth (INDEPENDENT_AMBULATORY_CARE_PROVIDER_SITE_OTHER): Payer: Medicare HMO | Admitting: Family Medicine

## 2020-04-13 ENCOUNTER — Ambulatory Visit: Payer: Self-pay | Admitting: *Deleted

## 2020-04-13 ENCOUNTER — Encounter: Payer: Self-pay | Admitting: Family Medicine

## 2020-04-13 ENCOUNTER — Other Ambulatory Visit: Payer: Self-pay

## 2020-04-13 DIAGNOSIS — C4441 Basal cell carcinoma of skin of scalp and neck: Secondary | ICD-10-CM | POA: Diagnosis not present

## 2020-04-13 DIAGNOSIS — C44722 Squamous cell carcinoma of skin of right lower limb, including hip: Secondary | ICD-10-CM | POA: Diagnosis not present

## 2020-04-13 DIAGNOSIS — J441 Chronic obstructive pulmonary disease with (acute) exacerbation: Secondary | ICD-10-CM | POA: Diagnosis not present

## 2020-04-13 DIAGNOSIS — D2262 Melanocytic nevi of left upper limb, including shoulder: Secondary | ICD-10-CM | POA: Diagnosis not present

## 2020-04-13 DIAGNOSIS — Z85828 Personal history of other malignant neoplasm of skin: Secondary | ICD-10-CM | POA: Diagnosis not present

## 2020-04-13 DIAGNOSIS — X32XXXA Exposure to sunlight, initial encounter: Secondary | ICD-10-CM | POA: Diagnosis not present

## 2020-04-13 DIAGNOSIS — D225 Melanocytic nevi of trunk: Secondary | ICD-10-CM | POA: Diagnosis not present

## 2020-04-13 DIAGNOSIS — D2271 Melanocytic nevi of right lower limb, including hip: Secondary | ICD-10-CM | POA: Diagnosis not present

## 2020-04-13 DIAGNOSIS — D2272 Melanocytic nevi of left lower limb, including hip: Secondary | ICD-10-CM | POA: Diagnosis not present

## 2020-04-13 DIAGNOSIS — Z85819 Personal history of malignant neoplasm of unspecified site of lip, oral cavity, and pharynx: Secondary | ICD-10-CM

## 2020-04-13 DIAGNOSIS — Z853 Personal history of malignant neoplasm of breast: Secondary | ICD-10-CM

## 2020-04-13 DIAGNOSIS — C44612 Basal cell carcinoma of skin of right upper limb, including shoulder: Secondary | ICD-10-CM | POA: Diagnosis not present

## 2020-04-13 DIAGNOSIS — L905 Scar conditions and fibrosis of skin: Secondary | ICD-10-CM | POA: Diagnosis not present

## 2020-04-13 DIAGNOSIS — C44622 Squamous cell carcinoma of skin of right upper limb, including shoulder: Secondary | ICD-10-CM | POA: Diagnosis not present

## 2020-04-13 DIAGNOSIS — D485 Neoplasm of uncertain behavior of skin: Secondary | ICD-10-CM | POA: Diagnosis not present

## 2020-04-13 DIAGNOSIS — L942 Calcinosis cutis: Secondary | ICD-10-CM | POA: Diagnosis not present

## 2020-04-13 DIAGNOSIS — L570 Actinic keratosis: Secondary | ICD-10-CM | POA: Diagnosis not present

## 2020-04-13 MED ORDER — DOXYCYCLINE HYCLATE 100 MG PO TABS: 100.0000 mg | ORAL_TABLET | Freq: Two times a day (BID) | ORAL | 0 refills | Status: DC

## 2020-04-13 MED ORDER — PREDNISONE 10 MG PO TABS: ORAL_TABLET | ORAL | 0 refills | Status: DC

## 2020-04-13 NOTE — Telephone Encounter (Signed)
Patient is calling to report she is having cough, sore throat, SOB with exertion. Patient states she was treated for bronchitis previously- but does not think it cleared. Patient is requesting treatment again. Advised patient she may need further testing- appointment scheduled- virtual. Reason for Disposition . [1] MILD difficulty breathing (e.g., minimal/no SOB at rest, SOB with walking, pulse <100) AND [2] still present when not coughing . [1] MILD difficulty breathing (e.g., minimal/no SOB at rest, SOB with walking, pulse <100) AND [2] NEW-onset or WORSE than normal  Answer Assessment - Initial Assessment Questions 1. ONSET: "When did the cough begin?"      Had not completely gone away from previous visit 2. SEVERITY: "How bad is the cough today?"      throat is raw- has spasms 3. SPUTUM: "Describe the color of your sputum" (none, dry cough; clear, white, yellow, green)     Yellowish, whitish 4. HEMOPTYSIS: "Are you coughing up any blood?" If so ask: "How much?" (flecks, streaks, tablespoons, etc.)     no 5. DIFFICULTY BREATHING: "Are you having difficulty breathing?" If Yes, ask: "How bad is it?" (e.g., mild, moderate, severe)    - MILD: No SOB at rest, mild SOB with walking, speaks normally in sentences, can lay down, no retractions, pulse < 100.    - MODERATE: SOB at rest, SOB with minimal exertion and prefers to sit, cannot lie down flat, speaks in phrases, mild retractions, audible wheezing, pulse 100-120.    - SEVERE: Very SOB at rest, speaks in single words, struggling to breathe, sitting hunched forward, retractions, pulse > 120      moderate 6. FEVER: "Do you have a fever?" If Yes, ask: "What is your temperature, how was it measured, and when did it start?"     Patient does not have thermometer  7. CARDIAC HISTORY: "Do you have any history of heart disease?" (e.g., heart attack, congestive heart failure)      no 8. LUNG HISTORY: "Do you have any history of lung disease?"  (e.g.,  pulmonary embolus, asthma, emphysema)     asthma 9. PE RISK FACTORS: "Do you have a history of blood clots?" (or: recent major surgery, recent prolonged travel, bedridden)     no 10. OTHER SYMPTOMS: "Do you have any other symptoms?" (e.g., runny nose, wheezing, chest pain)       Runny nose, wheezing 11. PREGNANCY: "Is there any chance you are pregnant?" "When was your last menstrual period?"       n/a 12. TRAVEL: "Have you traveled out of the country in the last month?" (e.g., travel history, exposures)       Travel 02/09/20  Answer Assessment - Initial Assessment Questions 1. RESPIRATORY STATUS: "Describe your breathing?" (e.g., wheezing, shortness of breath, unable to speak, severe coughing)      SOB- with exertion- causes coughing 2. ONSET: "When did this breathing problem begin?"      Over 2 weeks- breathing getting worse 3. PATTERN "Does the difficult breathing come and go, or has it been constant since it started?"      Comes and goes 4. SEVERITY: "How bad is your breathing?" (e.g., mild, moderate, severe)    - MILD: No SOB at rest, mild SOB with walking, speaks normally in sentences, can lay down, no retractions, pulse < 100.    - MODERATE: SOB at rest, SOB with minimal exertion and prefers to sit, cannot lie down flat, speaks in phrases, mild retractions, audible wheezing, pulse 100-120.    -  SEVERE: Very SOB at rest, speaks in single words, struggling to breathe, sitting hunched forward, retractions, pulse > 120      Mild/moderate 5. RECURRENT SYMPTOM: "Have you had difficulty breathing before?" If Yes, ask: "When was the last time?" and "What happened that time?"      Yes- not this bad- bronchitis 6. CARDIAC HISTORY: "Do you have any history of heart disease?" (e.g., heart attack, angina, bypass surgery, angioplasty)      See previous not 7. LUNG HISTORY: "Do you have any history of lung disease?"  (e.g., pulmonary embolus, asthma, emphysema)     See previous note 8. CAUSE:  "What do you think is causing the breathing problem?"      Bronchitis possible 9. OTHER SYMPTOMS: "Do you have any other symptoms? (e.g., dizziness, runny nose, cough, chest pain, fever)     Runny nose, cough, dizziness 10. PREGNANCY: "Is there any chance you are pregnant?" "When was your last menstrual period?"       n/a 11. TRAVEL: "Have you traveled out of the country in the last month?" (e.g., travel history, exposures)       [place]- October  Protocols used: [place], BREATHING DIFFICULTY-A-AH

## 2020-04-13 NOTE — Progress Notes (Signed)
Virtual telephone visit    Virtual Visit via Telephone Note   This visit type was conducted due to national recommendations for restrictions regarding the COVID-19 Pandemic (e.g. social distancing) in an effort to limit this patient's exposure and mitigate transmission in our community. Due to her co-morbid illnesses, this patient is at least at moderate risk for complications without adequate follow up. This format is felt to be most appropriate for this patient at this time. The patient did not have access to video technology or had technical difficulties with video requiring transitioning to audio format only (telephone). Physical exam was limited to content and character of the telephone converstion.    Patient location: home Provider location: office  I discussed the limitations of evaluation and management by telemedicine and the availability of in person appointments. The patient expressed understanding and agreed to proceed.   Visit Date: 04/13/2020  Today's healthcare provider: Vernie Murders, PA-C   No chief complaint on file.  Subjective    HPI  Patient complains of cough and shortness of breath and body aches.  Patient states she has been sick for about 3 weeks.  She has had this recently and had just gotten over it and now feels her daughter gave her the infection again.   She has been using Mucinex and has gone through a whole box.    Past Medical History:  Diagnosis Date  . Anxiety   . Arthritis    KNEE RIGHT  . Breast cancer [facility]) 1997   right breast lumpectomy with radiation  . Breast cancer ([place]) 11/15/2017   INVASIVE MAMMARY CARCINOMA/ triple negative  . 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 Cough 03/02/2017   INTERMTTENT DRY COUGH  . Depression   . GERD (gastroesophageal reflux disease)   . History of hiatal hernia   . Personal history of chemotherapy   . Personal history of radiation therapy   . Skin cancer   . Throat cancer [facility]) 1998    radiation   Past Surgical History:  Procedure Laterality Date  . BREAST BIOPSY Right 1997   positive  . BREAST BIOPSY Right 11/15/2017   INVASIVE MAMMARY CARCINOMA triple negative  . BREAST LUMPECTOMY Right 1997   2019 also  . BREAST LUMPECTOMY WITH SENTINEL LYMPH NODE BIOPSY Right 12/08/2017   Procedure: BREAST LUMPECTOMY WITH SENTINEL LYMPH NODE BX;  Surgeon: Robert Bellow, MD;  Location: [facility] ORS;  Service: General;  Laterality: Right;  . BREAST REDUCTION WITH MASTOPEXY Left 06/10/2019   Procedure: Left breast mastopexy reduction for symmetry;  Surgeon: Wallace Going, DO;  Location: [place];  Service: Plastics;  Laterality: Left;  total 2.5 hours  . COLONOSCOPY  2015  . COLONOSCOPY WITH PROPOFOL N/A 10/29/2019   Procedure: COLONOSCOPY WITH PROPOFOL;  Surgeon: Lucilla Lame, MD;  Location: [facility] END[facility]COPY;  Service: Endoscopy;  Laterality: N/A;  . ESOPHAGOGASTRODUODENOSCOPY (EGD) WITH PROPOFOL N/A 05/24/2019   Procedure: ESOPHAGOGASTRODUODENOSCOPY (EGD) WITH PROPOFOL;  Surgeon: Lucilla Lame, MD;  Location: [facility] ENDOSCOPY;  Service: Endoscopy;  Laterality: N/A;  . ESOPHAGUS SURGERY    . KNEE SURGERY Right   . LIPOSUCTION WITH LIPOFILLING Right 06/10/2019   Procedure: right breast scar and capsule release with fat grafting;  Surgeon: Wallace Going, DO;  Location: [place];  Service: Plastics;  Laterality: Right;  . PORT-A-CATH REMOVAL    . PORTACATH PLACEMENT Right 01/17/2018   Procedure: INSERTION PORT-A-CATH- RIGHT;  Surgeon: Robert Bellow, MD;  Location:  [facility] ORS;  Service: General;  Laterality: Right;  . ROBOTIC ADRENALECTOMY Right 12/28/2016   Procedure: ROBOTIC ADRENALECTOMY;  Surgeon: Hollice Espy, MD;  Location: [facility] ORS;  Service: Urology;  Laterality: Right;  . SKIN CANCER EXCISION    . THROAT SURGERY  1998   throat cancer   . TUBAL LIGATION    . VENTRAL HERNIA REPAIR N/A 03/03/2017   Procedure: HERNIA REPAIR VENTRAL ADULT;   Surgeon: Clayburn Pert, MD;  Location: [facility] ORS;  Service: General;  Laterality: N/A;   Social History   Tobacco Use  . Smoking status: Former Smoker    Packs/day: 1.50    Years: 30.00    Pack years: 45.00    Types: Cigarettes    Quit date: 05/02/1996    Years since quitting: 23.9  . Smokeless tobacco: Never Used  Vaping Use  . Vaping Use: Never used  Substance Use Topics  . Alcohol use: Yes    Alcohol/week: 4.0 standard drinks    Types: 4 Shots of liquor per week    Comment: 2 per night on weekends  . Drug use: No   Family History  Problem Relation Age of Onset  . Diabetes Mother   . Heart attack Mother   . Cancer Sister        cancer of eyes, spread to lung, other places. died at 69  . Cancer Sister 33       unknown primary, spread to bone, brain died in 28's  . Cancer Paternal Aunt        unk type  . Cancer Paternal Grandmother        type unk  . Prostate cancer Neg Hx   . Kidney cancer Neg Hx   . Bladder Cancer Neg Hx    No Known Allergies    Medications: Outpatient Medications Prior to Visit  Medication Sig  . albuterol (VENTOLIN HFA) 108 (90 Base) MCG/ACT inhaler Inhale 2 puffs into the lungs every 6 (six) hours as needed for wheezing or shortness of breath.  . benzonatate (TESSALON) 100 MG capsule Take 1 capsule (100 mg total) by mouth 3 (three) times daily.  . busPIRone (BUSPAR) 5 MG tablet Take 1 tablet (5 mg total) by mouth 2 (two) times daily.  . cetirizine (ZYRTEC) 10 MG tablet Take 10 mg by mouth daily.  . clotrimazole (MYCELEX) 10 MG troche Take 1 tablet (10 mg total) by mouth 5 (five) times daily.  . Cyanocobalamin (B-12 COMPLIANCE INJECTION IJ) Inject 1 Dose as directed every 30 (thirty) days.  . cyclobenzaprine (FLEXERIL) 5 MG tablet Take 1 tablet (5 mg total) by mouth 3 (three) times daily as needed for muscle spasms.  . DULoxetine (CYMBALTA) 60 MG capsule Take 1 capsule by mouth daily.  Marland Kitchen gabapentin (NEURONTIN) 300 MG capsule Take 1 capsule (300  mg total) by mouth 3 (three) times daily.  Marland Kitchen omeprazole (PRILOSEC) 40 MG capsule TAKE 1 CAPSULE BY MOUTH EVERY DAY (MORNING)  . ondansetron (ZOFRAN) 4 MG tablet Take 1 tablet (4 mg total) by mouth every 8 (eight) hours as needed for nausea or vomiting.  Marland Kitchen oxyCODONE (ROXICODONE) 5 MG immediate release tablet Take 1 tablet (5 mg total) by mouth every 8 (eight) hours as needed.  . phenazopyridine (PYRIDIUM) 200 MG tablet Take 1 tablet (200 mg total) by mouth 3 (three) times daily as needed for pain. (Patient not taking: Reported on 03/03/2020)  . rOPINIRole (REQUIP) 0.25 MG tablet Take 0.25 mg by mouth 3 (three) times daily.  Facility-Administered Medications Prior to Visit  Medication Dose Route Frequency Provider  . cyanocobalamin ((VITAMIN B-12)) injection 1,000 mcg  1,000 mcg Intramuscular Q30 days Sindy Guadeloupe, MD  . cyanocobalamin ((VITAMIN B-12)) injection 1,000 mcg  1,000 mcg Intramuscular Q30 days Sindy Guadeloupe, MD  . cyanocobalamin ((VITAMIN B-12)) injection 1,000 mcg  1,000 mcg Intramuscular Q30 days Sindy Guadeloupe, MD  . cyanocobalamin ((VITAMIN B-12)) injection 1,000 mcg  1,000 mcg Intramuscular Q30 days Sindy Guadeloupe, MD    Review of Systems  Constitutional: Positive for chills, diaphoresis and fatigue.  HENT: Positive for congestion, facial swelling, postnasal drip, rhinorrhea, sneezing, sore throat, trouble swallowing and voice change. Negative for ear discharge, ear pain, sinus pressure, sinus pain and tinnitus.   Eyes:       Puffy eyes  Respiratory: Positive for cough, shortness of breath and wheezing.   Cardiovascular: Negative for chest pain and palpitations.  Gastrointestinal: Negative for abdominal pain and diarrhea.  Musculoskeletal: Positive for myalgias.  Neurological: Positive for dizziness and headaches.      Objective    There were no vitals taken for this visit.  During telephonic interview, voice is a little weak without wheezing. Intermittent  cough.   Assessment & Plan     1. COPD exacerbation (HCC) Persistent cough over the past 2-3 weeks without fever. Feels it is the same as past bronchitis exacerbation. Continue Mucinex-DM and add prednisone taper with Doxycycline and Albuterol prn. Has had COVID vaccinations with booster and flu shot. Monitor for fever or COVID symptoms. Increase fluid intake and follow up prn. - predniSONE (DELTASONE) 10 MG tablet; Taper down by 1 tablet by mouth daily starting at 6 day 1, then 5 day 2, 4 day 3, 3 day 4, 2 day 5 and 1 day 6. Divide daily dosage among meals and bedtime.  Dispense: 21 tablet; Refill: 0 - doxycycline (VIBRA-TABS) 100 MG tablet; Take 1 tablet (100 mg total) by mouth 2 (two) times daily.  Dispense: 20 tablet; Refill: 0  2. History of right breast cancer Followed by oncologist and surgeon.  3. History of throat cancer Followed by oncologist and surgeon.   No follow-ups on file.    I discussed the assessment and treatment plan with the patient. The patient was provided an opportunity to ask questions and all were answered. The patient agreed with the plan and demonstrated an understanding of the instructions.   The patient was advised to call back or seek an in-person evaluation if the symptoms worsen or if the condition fails to improve as anticipated.  I provided 22 minutes of non-face-to-face time during this encounter.  I, Bryana Froemming, PA-C, have reviewed all documentation for this visit. The documentation on 04/13/20 for the exam, diagnosis, procedures, and orders are all accurate and complete.   Vernie Murders, PA-C [employer] [phone] (phone) [phone] (fax)  [place]

## 2020-05-07 DIAGNOSIS — L570 Actinic keratosis: Secondary | ICD-10-CM | POA: Diagnosis not present

## 2020-05-07 DIAGNOSIS — D0461 Carcinoma in situ of skin of right upper limb, including shoulder: Secondary | ICD-10-CM | POA: Diagnosis not present

## 2020-05-07 DIAGNOSIS — C44622 Squamous cell carcinoma of skin of right upper limb, including shoulder: Secondary | ICD-10-CM | POA: Diagnosis not present

## 2020-05-12 ENCOUNTER — Other Ambulatory Visit: Payer: Self-pay | Admitting: Family Medicine

## 2020-05-12 NOTE — Telephone Encounter (Signed)
Pt called back to report that she is suffering from severe cramps since she has finished her supply. She is requesting for PCP to refill if possible, please advise and contact patient if appt is necessary.   Best contact: [phone]  cyclobenzaprine (FLEXERIL) 5 MG tablet  [facility] [place] [place] [address]  [address] [place] [address]  Phone: [phone] Fax: [phone]   States her cramps are severe all over

## 2020-05-12 NOTE — Telephone Encounter (Signed)
  Notes to clinic: medication filled by a different provider Review for refill   Requested Prescriptions  Pending Prescriptions Disp Refills   cyclobenzaprine (FLEXERIL) 5 MG tablet [Pharmacy Med Name: CYCLOBENZAPRINE HCL 5 MG TAB] 30 tablet 1    Sig: TAKE 1 TABLET BY MOUTH 3 TIMES DAILY AS NEEDED FOR MUSCLE SPASMS      Not Delegated - Analgesics:  Muscle Relaxants Failed - 05/12/2020  1:55 PM      Failed - This refill cannot be delegated      Passed - Valid encounter within last 6 months    Recent Outpatient Visits           4 weeks ago COPD exacerbation [facility])   [place], Vickki Muff, PA-C   2 months ago [place], PA-C   3 months ago Stomatitis   [place], PA-C   3 months ago Stomatitis   [place], PA-C   3 months ago    [place], [place]

## 2020-05-25 DIAGNOSIS — D234 Other benign neoplasm of skin of scalp and neck: Secondary | ICD-10-CM | POA: Diagnosis not present

## 2020-05-25 DIAGNOSIS — C4442 Squamous cell carcinoma of skin of scalp and neck: Secondary | ICD-10-CM | POA: Diagnosis not present

## 2020-06-01 ENCOUNTER — Other Ambulatory Visit: Payer: Self-pay | Admitting: Family Medicine

## 2020-06-08 ENCOUNTER — Ambulatory Visit
Admission: RE | Admit: 2020-06-08 | Discharge: 2020-06-08 | Disposition: A | Discharge status: Home / Self Care | Payer: Medicare HMO | Source: Ambulatory Visit | Attending: Physician Assistant | Admitting: Physician Assistant

## 2020-06-08 ENCOUNTER — Other Ambulatory Visit: Payer: Self-pay

## 2020-06-08 ENCOUNTER — Telehealth (INDEPENDENT_AMBULATORY_CARE_PROVIDER_SITE_OTHER): Payer: Medicare HMO | Admitting: Physician Assistant

## 2020-06-08 ENCOUNTER — Encounter: Payer: Self-pay | Admitting: Physician Assistant

## 2020-06-08 VITALS — Wt 140.0 lb

## 2020-06-08 DIAGNOSIS — C44722 Squamous cell carcinoma of skin of right lower limb, including hip: Secondary | ICD-10-CM | POA: Diagnosis not present

## 2020-06-08 DIAGNOSIS — R059 Cough, unspecified: Secondary | ICD-10-CM

## 2020-06-08 DIAGNOSIS — D2371 Other benign neoplasm of skin of right lower limb, including hip: Secondary | ICD-10-CM | POA: Diagnosis not present

## 2020-06-08 DIAGNOSIS — R053 Chronic cough: Secondary | ICD-10-CM

## 2020-06-08 DIAGNOSIS — J9811 Atelectasis: Secondary | ICD-10-CM | POA: Diagnosis not present

## 2020-06-08 MED ORDER — BENZONATATE 100 MG PO CAPS: 100.0000 mg | ORAL_CAPSULE | Freq: Three times a day (TID) | ORAL | 0 refills | Status: DC

## 2020-06-08 MED ORDER — FLUTICASONE FUROATE-VILANTEROL 200-25 MCG/INH IN AEPB: 1.0000 | INHALATION_SPRAY | Freq: Every day | RESPIRATORY_TRACT | 5 refills | Status: DC

## 2020-06-08 NOTE — Patient Instructions (Signed)
COPD and Physical Activity Chronic obstructive pulmonary disease (COPD) is a long-term (chronic) condition that affects the lungs. COPD is a general term that can be used to describe many different lung problems that cause lung swelling (inflammation) and limit airflow, including chronic bronchitis and emphysema. The main symptom of COPD is shortness of breath, which makes it harder to do even simple tasks. This can also make it harder to exercise and be active. Talk with your health care provider about treatments to help you breathe better and actions you can take to prevent breathing problems during physical activity. What are the benefits of exercising with COPD? Exercising regularly is an important part of a healthy lifestyle. You can still exercise and do physical activities even though you have COPD. Exercise and physical activity improve your shortness of breath by increasing blood flow (circulation). This causes your heart to pump more oxygen through your body. Moderate exercise can improve your:  Oxygen use.  Energy level.  Shortness of breath.  Strength in your breathing muscles.  Heart health.  Sleep.  Self-esteem and feelings of self-worth.  Depression, stress, and anxiety levels. Exercise can benefit everyone with COPD. The severity of your disease may affect how hard you can exercise, especially at first, but everyone can benefit. Talk with your health care provider about how much exercise is safe for you, and which activities and exercises are safe for you.   What actions can I take to prevent breathing problems during physical activity?  Sign up for a pulmonary rehabilitation program. This type of program may include: ? Education about lung diseases. ? Exercise classes that teach you how to exercise and be more active while improving your breathing. This usually involves:  Exercise using your lower extremities, such as a stationary bicycle.  About 30 minutes of exercise,  2 to 5 times per week, for 6 to 12 weeks  Strength training, such as push ups or leg lifts. ? Nutrition education. ? Group classes in which you can talk with others who also have COPD and learn ways to manage stress.  If you use an oxygen tank, you should use it while you exercise. Work with your health care provider to adjust your oxygen for your physical activity. Your resting flow rate is different from your flow rate during physical activity.  While you are exercising: ? Take slow breaths. ? Pace yourself and do not try to go too fast. ? Purse your lips while breathing out. Pursing your lips is similar to a kissing or whistling position. ? If doing exercise that uses a quick burst of effort, such as weight lifting:  Breathe in before starting the exercise.  Breathe out during the hardest part of the exercise (such as raising the weights). Where to find support You can find support for exercising with COPD from:  Your health care provider.  A pulmonary rehabilitation program.  Your local health department or community health programs.  Support groups, online or in-person. Your health care provider may be able to recommend support groups. Where to find more information You can find more information about exercising with COPD from:  American Lung Association: lung.org.  COPD Foundation: copdfoundation.org. Contact a health care provider if:  Your symptoms get worse.  You have chest pain.  You have nausea.  You have a fever.  You have trouble talking or catching your breath.  You want to start a new exercise program or a new activity. Summary  COPD is a general term   that can be used to describe many different lung problems that cause lung swelling (inflammation) and limit airflow. This includes chronic bronchitis and emphysema.  Exercise and physical activity improve your shortness of breath by increasing blood flow (circulation). This causes your heart to provide  more oxygen to your body.  Contact your health care provider before starting any exercise program or new activity. Ask your health care provider what exercises and activities are safe for you. This information is not intended to replace advice given to you by your health care provider. Make sure you discuss any questions you have with your health care provider. Document Revised: 08/08/2018 Document Reviewed: 05/11/2017 Elsevier Patient Education  2021 [employer].  

## 2020-06-08 NOTE — Progress Notes (Signed)
MyChart Video Visit    Virtual Visit via Video Note   This visit type was conducted due to national recommendations for restrictions regarding the COVID-19 Pandemic (e.g. social distancing) in an effort to limit this patient's exposure and mitigate transmission in our community. This patient is at least at moderate risk for complications without adequate follow up. This format is felt to be most appropriate for this patient at this time. Physical exam was limited by quality of the video and audio technology used for the visit.   Interactive audio and video communications were attempted, although failed due to patient's inability to connect to video. Continued visit with audio only interaction with patient agreement.  Patient location: Home Provider location: [facility]  I discussed the limitations of evaluation and management by telemedicine and the availability of in person appointments. The patient expressed understanding and agreed to proceed.  Patient: Denise Watts   DOB: Jun 12, 1955   65 y.o. Female  MRN: [id number] Visit Date: 06/08/2020  Today's healthcare provider: Margaretann Loveless, PA-C   Chief Complaint  Patient presents with  . Cough   Subjective    Cough This is a recurrent problem. The current episode started more than 1 month ago. The problem has been gradually worsening. The problem occurs constantly. The cough is productive of sputum. Associated symptoms include a fever, headaches, myalgias, rhinorrhea, shortness of breath and wheezing. Pertinent negatives include no chest pain, chills, ear congestion, ear pain, nasal congestion, postnasal drip, rash, sore throat or sweats. The symptoms are aggravated by lying down (heat). Risk factors for lung disease include smoking/tobacco exposure. She has tried prescription cough suppressant, oral steroids and steroid inhaler for the symptoms. The treatment provided no relief.   HPI    Patient reports that she  has been on two different antibiotics and one round of steroids. Patient reports cough is worse and medications have not helped at all. Patient reports Maurine Minister was going to check an x-ray.   Last edited by Myles Lipps, CMA on 06/08/2020  1:51 PM. (History)         Patient Active Problem List   Diagnosis Date Noted  . Encounter for screening colonoscopy   . Polyp of transverse colon   . Dysphagia   . Stricture and stenosis of esophagus   . Cervical spondylosis 03/18/2019  . 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 Need for prophylactic vaccination and inoculation against influenza 01/25/2019  . Postoperative breast asymmetry 01/04/2019  . Status post partial mastectomy 01/04/2019  . Vitamin D deficiency 12/12/2018  . Bursitis of left shoulder 11/21/2018  . Rotator cuff tendinitis, left 11/21/2018  . Genetic testing 02/01/2018  . Skin cancer 01/18/2018  . Goals of care, counseling/discussion 01/12/2018  . B12 deficiency 12/28/2017  . Malignant neoplasm of lower-outer quadrant of right breast of female, estrogen receptor negative (HCC) 11/24/2017  . Chronic abdominal pain (Primary Area of Pain)  04/03/2017  . Chronic low back pain (Secondary Area of Pain) (B) (R>L) 04/03/2017  . Chronic knee pain [facility] Area of Pain) (R) 04/03/2017  . Chronic pain syndrome 04/03/2017  . Disorder of bone, unspecified 04/03/2017  . Other specified health status 04/03/2017  . Other long term (current) drug therapy 04/03/2017  . Long term current use of opiate analgesic 04/03/2017  . Chronic sacroiliac joint pain 04/03/2017  . S/P repair of ventral hernia 03/03/2017  . Incisional hernia, without obstruction or gangrene 02/13/2017  . Right adrenal mass (HCC) 12/28/2016  . RIGHT  Adrenal Mass 12/16/2016  . Fatigue 02/17/2016  . Other fatigue 02/17/2016  . Multiple lung nodules on CT 02/17/2016  . History of right breast cancer 02/17/2016  . Peripheral neuropathy 02/17/2016  . Closed  fracture of a rib 02/02/2015  . History of fundoplication 76/73/4193  . History of throat cancer 01/15/2015  . Acid reflux 01/15/2015  . Gonalgia 01/15/2015  . Closed fracture of pubis ([place]) 01/15/2015  . Pelvic fracture ([place]) 01/12/2015  . 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 Herpes simplex virus (HSV) epithelial keratitis 03/06/2013  . Nuclear sclerotic cataract 03/06/2013  . Onychia of finger 10/23/2008  . Odontogenic tumor 03/13/2008  . Tobacco use 06/14/2007   Social History   Tobacco Use  . Smoking status: Former Smoker    Packs/day: 1.50    Years: 30.00    Pack years: 45.00    Types: Cigarettes    Quit date: 05/02/1996    Years since quitting: 24.1  . Smokeless tobacco: Never Used  Vaping Use  . Vaping Use: Never used  Substance Use Topics  . Alcohol use: Yes    Alcohol/week: 4.0 standard drinks    Types: 4 Shots of liquor per week    Comment: 2 per night on weekends  . Drug use: No   No Known Allergies   Medications: Outpatient Medications Prior to Visit  Medication Sig  . albuterol (VENTOLIN HFA) 108 (90 Base) MCG/ACT inhaler Inhale 2 puffs into the lungs every 6 (six) hours as needed for wheezing or shortness of breath.  . busPIRone (BUSPAR) 5 MG tablet Take 1 tablet (5 mg total) by mouth 2 (two) times daily.  . cetirizine (ZYRTEC) 10 MG tablet Take 10 mg by mouth daily.  . clotrimazole (MYCELEX) 10 MG troche Take 1 tablet (10 mg total) by mouth 5 (five) times daily.  . Cyanocobalamin (B-12 COMPLIANCE INJECTION IJ) Inject 1 Dose as directed every 30 (thirty) days.  . cyclobenzaprine (FLEXERIL) 5 MG tablet TAKE 1 TABLET BY MOUTH 3 TIMES DAILY AS NEEDED FOR MUSCLE SPASMS  . DULoxetine (CYMBALTA) 60 MG capsule Take 1 capsule by mouth daily.  Marland Kitchen gabapentin (NEURONTIN) 300 MG capsule TAKE 1 CAPSULE BY MOUTH 3 TIMES A DAY  . omeprazole (PRILOSEC) 40 MG capsule TAKE 1 CAPSULE BY MOUTH EVERY DAY (MORNING)  . ondansetron (ZOFRAN) 4 MG tablet Take 1 tablet (4 mg  total) by mouth every 8 (eight) hours as needed for nausea or vomiting.  Marland Kitchen oxyCODONE (ROXICODONE) 5 MG immediate release tablet Take 1 tablet (5 mg total) by mouth every 8 (eight) hours as needed.  . phenazopyridine (PYRIDIUM) 200 MG tablet Take 1 tablet (200 mg total) by mouth 3 (three) times daily as needed for pain.  Marland Kitchen rOPINIRole (REQUIP) 0.25 MG tablet Take 0.25 mg by mouth 3 (three) times daily.   . [DISCONTINUED] benzonatate (TESSALON) 100 MG capsule Take 1 capsule (100 mg total) by mouth 3 (three) times daily. (Patient not taking: Reported on 06/08/2020)  . [DISCONTINUED] doxycycline (VIBRA-TABS) 100 MG tablet Take 1 tablet (100 mg total) by mouth 2 (two) times daily. (Patient not taking: Reported on 06/08/2020)  . [DISCONTINUED] predniSONE (DELTASONE) 10 MG tablet Taper down by 1 tablet by mouth daily starting at 6 day 1, then 5 day 2, 4 day 3, 3 day 4, 2 day 5 and 1 day 6. Divide daily dosage among meals and bedtime.   Facility-Administered Medications Prior to Visit  Medication Dose Route Frequency Provider  . cyanocobalamin ((VITAMIN B-12)) injection  1,000 mcg  1,000 mcg Intramuscular Q30 days Sindy Guadeloupe, MD  . cyanocobalamin ((VITAMIN B-12)) injection 1,000 mcg  1,000 mcg Intramuscular Q30 days Sindy Guadeloupe, MD  . cyanocobalamin ((VITAMIN B-12)) injection 1,000 mcg  1,000 mcg Intramuscular Q30 days Sindy Guadeloupe, MD  . cyanocobalamin ((VITAMIN B-12)) injection 1,000 mcg  1,000 mcg Intramuscular Q30 days Sindy Guadeloupe, MD    Review of Systems  Constitutional: Positive for fever. Negative for chills.  HENT: Positive for rhinorrhea. Negative for ear pain, postnasal drip and sore throat.   Respiratory: Positive for cough, shortness of breath and wheezing.   Cardiovascular: Negative for chest pain.  Musculoskeletal: Positive for myalgias.  Skin: Negative for rash.  Neurological: Positive for headaches.    Last CBC Lab Results  Component Value Date   WBC 5.2 02/20/2020   HGB  14.1 02/20/2020   HCT 42.2 02/20/2020   MCV 100.2 (H) 02/20/2020   MCH 33.5 02/20/2020   RDW 12.3 02/20/2020   PLT 249 02/20/2020      Objective    Wt 140 lb (63.5 kg)   BMI 26.45 kg/m  BP Readings from Last 3 Encounters:  03/09/20 113/78  03/03/20 110/65  02/20/20 95/83   Wt Readings from Last 3 Encounters:  06/08/20 140 lb (63.5 kg)  03/09/20 137 lb (62.1 kg)  03/03/20 141 lb (64 kg)      Physical Exam Pulmonary:     Effort: No respiratory distress (able to speak in full sentences).  Neurological:     Mental Status: She is alert.        Assessment & Plan     1. Cough Cough persistent x 3 months without improvements despite antibiotics and steroids. Patient does have h/o lung nodule and throat cancer. Will get CXR as below and f/u pending results. Will send Breo and tessalon perles as below for now while awaiting results. Call the office if symptoms worsen in the meantime.  - DG Chest 2 View; Future - fluticasone furoate-vilanterol (BREO ELLIPTA) 200-25 MCG/INH AEPB; Inhale 1 puff into the lungs daily.  Dispense: 60 each; Refill: 5  2. Persistent cough See above medical treatment plan. - benzonatate (TESSALON) 100 MG capsule; Take 1 capsule (100 mg total) by mouth 3 (three) times daily.  Dispense: 30 capsule; Refill: 0   No follow-ups on file.     I discussed the assessment and treatment plan with the patient. The patient was provided an opportunity to ask questions and all were answered. The patient agreed with the plan and demonstrated an understanding of the instructions.   The patient was advised to call back or seek an in-person evaluation if the symptoms worsen or if the condition fails to improve as anticipated.  I provided 12 minutes of non-face-to-face time during this encounter.  Reynolds Bowl, PA-C, have reviewed all documentation for this visit. The documentation on 06/08/20 for the exam, diagnosis, procedures, and orders are all accurate  and complete.  Rubye Beach [facility] [phone] (phone) [phone] (fax)  [place]

## 2020-06-15 ENCOUNTER — Telehealth: Payer: Self-pay

## 2020-06-15 DIAGNOSIS — R053 Chronic cough: Secondary | ICD-10-CM

## 2020-06-15 MED ORDER — FLUTICASONE-SALMETEROL 250-50 MCG/DOSE IN AEPB: 1.0000 | INHALATION_SPRAY | Freq: Two times a day (BID) | RESPIRATORY_TRACT | 3 refills | Status: DC

## 2020-06-15 MED ORDER — BENZONATATE 100 MG PO CAPS: 100.0000 mg | ORAL_CAPSULE | Freq: Three times a day (TID) | ORAL | 0 refills | Status: DC

## 2020-06-15 NOTE — Telephone Encounter (Signed)
I can send in some cough medication and change Breo to Advair, but I would recommend a referral to a pulmonologist since this has been an ongoing issue over 3 months.

## 2020-06-15 NOTE — Telephone Encounter (Signed)
Copied from [place] [id number]. Topic: Quick Communication - See Telephone Encounter >> Jun 15, 2020  3:01 PM Loma Boston wrote: Pt states that inhaler Tawanna Sat gave her is making her cough, thinks something is wrong with it, needs something stronger. States can not stop coughing, coughing up green fleam, shaky and weak. Want a fu call as just had an appt and no better. [phone]

## 2020-06-16 ENCOUNTER — Telehealth: Payer: Self-pay | Admitting: Family Medicine

## 2020-06-16 NOTE — Telephone Encounter (Addendum)
[facility] at [facility] is calling concerning the pt received breo on 06-08-2020 and rx for generic advair was sent to pharm on 06-15-2020. [facility] pharmacist would like to know if this is medication charge . Insurance may not paid due to both medication has steroid in them

## 2020-06-16 NOTE — Telephone Encounter (Signed)
Pt advised.  She agreed to the referral to Pulmonology.    Thanks,   -Mickel Baas

## 2020-06-16 NOTE — Telephone Encounter (Signed)
Logan from [employer] advised.   Thanks,   -Mickel Baas

## 2020-06-16 NOTE — Telephone Encounter (Signed)
Referral placed.

## 2020-06-16 NOTE — Telephone Encounter (Signed)
She did not tolerate breo so we were changing to see if she would tolerate advair better. She is not to use both, this is a change in treatment

## 2020-06-16 NOTE — Telephone Encounter (Signed)
Pt has called back in 2/15 am stating just received another call from office and missed call. Pt verified she knew about referral being submitted and about the new meds being sent in, could not find documentation of call within the last few min. Told pt that we would reach back out to her if there were additional updates or  the dr had reached out to her for. Pt says just to fu at the same [phone] if the call was re further advice.

## 2020-06-18 ENCOUNTER — Telehealth: Payer: Self-pay

## 2020-06-18 NOTE — Telephone Encounter (Signed)
Copied from [place] [id number]. Topic: General - Other >> Jun 18, 2020  1:09 PM Tessa Lerner A wrote: Reason for CRM: Patient was seen 06/08/20 by Prov. Joette Catching   Patient is currently experiencing urinary tract discomfort and requesting a prescription to help issues  Patient is also requesting to be prescribed a stronger cough medication to help with remaining cough  Patient declined to make an additional appointment with agent at the time of call  Please contact to advise

## 2020-06-18 NOTE — Telephone Encounter (Signed)
I don't think you saw her for urinary symptoms.  Does she need an appointment?  Thanks,   -Mickel Baas

## 2020-06-18 NOTE — Telephone Encounter (Signed)
Yeah just schedule an appt. I have not seen her for urinary symptoms. Also cough is over 59 month old she can be seen in office.

## 2020-06-18 NOTE — Telephone Encounter (Signed)
Apt 8:20 on 06/19/2020  Thanks,   -Mickel Baas

## 2020-06-19 ENCOUNTER — Ambulatory Visit (INDEPENDENT_AMBULATORY_CARE_PROVIDER_SITE_OTHER): Payer: Medicare HMO | Admitting: Physician Assistant

## 2020-06-19 ENCOUNTER — Other Ambulatory Visit: Payer: Self-pay

## 2020-06-19 ENCOUNTER — Encounter: Payer: Self-pay | Admitting: Physician Assistant

## 2020-06-19 VITALS — BP 128/82 | HR 106 | Temp 98.4°F | Wt 141.5 lb

## 2020-06-19 DIAGNOSIS — R350 Frequency of micturition: Secondary | ICD-10-CM

## 2020-06-19 DIAGNOSIS — R059 Cough, unspecified: Secondary | ICD-10-CM | POA: Diagnosis not present

## 2020-06-19 DIAGNOSIS — R3989 Other symptoms and signs involving the genitourinary system: Secondary | ICD-10-CM | POA: Diagnosis not present

## 2020-06-19 DIAGNOSIS — R300 Dysuria: Secondary | ICD-10-CM | POA: Diagnosis not present

## 2020-06-19 LAB — POCT URINALYSIS DIPSTICK
Bilirubin, UA: NEGATIVE
Glucose, UA: NEGATIVE
Ketones, UA: NEGATIVE
Nitrite, UA: POSITIVE
Protein, UA: NEGATIVE
Spec Grav, UA: 1.02 (ref 1.010–1.025)
Urobilinogen, UA: 0.2 E.U./dL
pH, UA: 6 (ref 5.0–8.0)

## 2020-06-19 MED ORDER — FLUTICASONE PROPIONATE 50 MCG/ACT NA SUSP: 1.0000 | Freq: Every day | NASAL | 6 refills | Status: DC

## 2020-06-19 MED ORDER — BENZONATATE 200 MG PO CAPS: 200.0000 mg | ORAL_CAPSULE | Freq: Three times a day (TID) | ORAL | 0 refills | Status: DC | PRN

## 2020-06-19 MED ORDER — SULFAMETHOXAZOLE-TRIMETHOPRIM 800-160 MG PO TABS: 1.0000 | ORAL_TABLET | Freq: Two times a day (BID) | ORAL | 0 refills | Status: DC

## 2020-06-19 NOTE — Progress Notes (Signed)
Established patient visit   Patient: Denise Watts   DOB: 03-Sep-1955   65 y.o. Female  MRN: [id number] Visit Date: 06/19/2020  Today's healthcare provider: Mar Daring, PA-C   Chief Complaint  Patient presents with  . Dysuria  I,Bocephus Cali M Cristiano Capri,acting as a Education administrator for [employer], PA-C.,have documented all relevant documentation on the behalf of Mar Daring, PA-C,as directed by  Mar Daring, PA-C while in the presence of Mar Daring, [place].  Subjective    Dysuria  This is a new problem. The current episode started in the past 7 days. The problem occurs every urination. The problem has been gradually worsening. The quality of the pain is described as burning and aching. The pain is at a severity of 5/10. The pain is moderate. There has been no fever. Associated symptoms include frequency and urgency. Pertinent negatives include no discharge, flank pain, hesitancy, nausea or vomiting. She has tried increased fluids for the symptoms. The treatment provided no relief. Her past medical history is significant for recurrent UTIs.    Chronic cough: cough has been persistent for almost 2 months. Sometimes productive. Worse at night. Has been treated with antibiotics, cough medications, inhalers, PPI. CXR overall unremarkable. Cough persists.    Medications: Outpatient Medications Prior to Visit  Medication Sig  . albuterol (VENTOLIN HFA) 108 (90 Base) MCG/ACT inhaler Inhale 2 puffs into the lungs every 6 (six) hours as needed for wheezing or shortness of breath.  . busPIRone (BUSPAR) 5 MG tablet Take 1 tablet (5 mg total) by mouth 2 (two) times daily.  . cetirizine (ZYRTEC) 10 MG tablet Take 10 mg by mouth daily.  . clotrimazole (MYCELEX) 10 MG troche Take 1 tablet (10 mg total) by mouth 5 (five) times daily.  . Cyanocobalamin (B-12 COMPLIANCE INJECTION IJ) Inject 1 Dose as directed every 30 (thirty) days.  . cyclobenzaprine (FLEXERIL) 5 MG tablet  TAKE 1 TABLET BY MOUTH 3 TIMES DAILY AS NEEDED FOR MUSCLE SPASMS  . DULoxetine (CYMBALTA) 60 MG capsule Take 1 capsule by mouth daily.  Marland Kitchen gabapentin (NEURONTIN) 300 MG capsule TAKE 1 CAPSULE BY MOUTH 3 TIMES A DAY  . omeprazole (PRILOSEC) 40 MG capsule TAKE 1 CAPSULE BY MOUTH EVERY DAY (MORNING)  . ondansetron (ZOFRAN) 4 MG tablet Take 1 tablet (4 mg total) by mouth every 8 (eight) hours as needed for nausea or vomiting.  Marland Kitchen rOPINIRole (REQUIP) 0.25 MG tablet Take 0.25 mg by mouth 3 (three) times daily.   . [DISCONTINUED] benzonatate (TESSALON) 100 MG capsule Take 1 capsule (100 mg total) by mouth 3 (three) times daily.  . [DISCONTINUED] Fluticasone-Salmeterol (ADVAIR DISKUS) 250-50 MCG/DOSE AEPB Inhale 1 puff into the lungs 2 (two) times daily.  . [DISCONTINUED] oxyCODONE (ROXICODONE) 5 MG immediate release tablet Take 1 tablet (5 mg total) by mouth every 8 (eight) hours as needed.  . [DISCONTINUED] phenazopyridine (PYRIDIUM) 200 MG tablet Take 1 tablet (200 mg total) by mouth 3 (three) times daily as needed for pain.   Facility-Administered Medications Prior to Visit  Medication Dose Route Frequency Provider  . cyanocobalamin ((VITAMIN B-12)) injection 1,000 mcg  1,000 mcg Intramuscular Q30 days Sindy Guadeloupe, MD  . cyanocobalamin ((VITAMIN B-12)) injection 1,000 mcg  1,000 mcg Intramuscular Q30 days Sindy Guadeloupe, MD  . cyanocobalamin ((VITAMIN B-12)) injection 1,000 mcg  1,000 mcg Intramuscular Q30 days Sindy Guadeloupe, MD  . cyanocobalamin ((VITAMIN B-12)) injection 1,000 mcg  1,000 mcg Intramuscular Q30 days  Sindy Guadeloupe, MD    Review of Systems  Constitutional: Positive for fatigue.  HENT: Positive for congestion.   Respiratory: Positive for cough, shortness of breath and wheezing.   Gastrointestinal: Negative for nausea and vomiting.  Genitourinary: Positive for dysuria, frequency and urgency. Negative for decreased urine volume, difficulty urinating, flank pain, hesitancy, vaginal  bleeding, vaginal discharge and vaginal pain.  Musculoskeletal: Positive for back pain.    Last CBC Lab Results  Component Value Date   WBC 5.2 02/20/2020   HGB 14.1 02/20/2020   HCT 42.2 02/20/2020   MCV 100.2 (H) 02/20/2020   MCH 33.5 02/20/2020   RDW 12.3 02/20/2020   PLT 249 76/54/6503   Last metabolic panel Lab Results  Component Value Date   GLUCOSE 94 02/20/2020   NA 139 02/20/2020   K 4.2 02/20/2020   CL 102 02/20/2020   CO2 29 02/20/2020   BUN 12 02/20/2020   CREATININE 0.52 02/20/2020   GFRNONAA >60 02/20/2020   GFRAA >60 01/20/2020   CALCIUM 8.5 (L) 02/20/2020   PROT 6.4 (L) 02/20/2020   ALBUMIN 3.8 02/20/2020   LABGLOB 2.3 04/03/2017   AGRATIO 1.9 04/03/2017   BILITOT 0.6 02/20/2020   ALKPHOS 66 02/20/2020   AST 20 02/20/2020   ALT 14 02/20/2020   ANIONGAP 8 02/20/2020       Objective    BP 128/82 (BP Location: Left Arm, Patient Position: Sitting, Cuff Size: Normal)   Pulse (!) 106   Temp 98.4 F (36.9 C) (Oral)   Wt 141 lb 8 oz (64.2 kg)   BMI 26.74 kg/m  BP Readings from Last 3 Encounters:  06/19/20 128/82  03/09/20 113/78  03/03/20 110/65   Wt Readings from Last 3 Encounters:  06/19/20 141 lb 8 oz (64.2 kg)  06/08/20 140 lb (63.5 kg)  03/09/20 137 lb (62.1 kg)       Physical Exam Constitutional:      General: She is not in acute distress.    Appearance: Normal appearance. She is well-developed, well-groomed, overweight and well-nourished. She is not diaphoretic.  HENT:     Head: Normocephalic and atraumatic.  Cardiovascular:     Rate and Rhythm: Normal rate and regular rhythm.     Heart sounds: Normal heart sounds. No murmur heard. No friction rub. No gallop.   Pulmonary:     Effort: Pulmonary effort is normal. No respiratory distress.     Breath sounds: Normal breath sounds. No wheezing or rales.  Abdominal:     General: Abdomen is flat. Bowel sounds are normal. There is no distension.     Palpations: Abdomen is soft. There is  no hepatosplenomegaly or mass.     Tenderness: There is abdominal tenderness in the suprapubic area. There is no CVA tenderness, right CVA tenderness, left CVA tenderness, guarding or rebound.  Skin:    General: Skin is warm and dry.  Neurological:     General: No focal deficit present.     Mental Status: She is alert and oriented to person, place, and time.  Psychiatric:        Mood and Affect: Mood normal.        Behavior: Behavior normal. Behavior is cooperative.      Results for orders placed or performed in visit on 06/19/20  POCT urinalysis dipstick  Result Value Ref Range   Color, UA Dark Yellow    Clarity, UA Cloudy    Glucose, UA Negative Negative   Bilirubin, UA Negative  Ketones, UA Negative    Spec Grav, UA 1.020 1.010 - 1.025   Blood, UA Hemolyzed:Large    pH, UA 6.0 5.0 - 8.0   Protein, UA Negative Negative   Urobilinogen, UA 0.2 0.2 or 1.0 E.U./dL   Nitrite, UA Positive    Leukocytes, UA Moderate (2+) (A) Negative   Appearance     Odor      Assessment & Plan     1. Dysuria UA positive.  - POCT urinalysis dipstick - Urine Culture - Urine Microscopic  2. Frequent urination See above medical treatment plan. - Urine Culture - Urine Microscopic  3. Cough Unchanged, Not worse, not better. Nighttime cough is worse. Will add flonase as below as cough may be related to post nasal drainage. If not improving will get a chest CT.  - fluticasone (FLONASE) 50 MCG/ACT nasal spray; Place 1 spray into both nostrils at bedtime.  Dispense: 16 g; Refill: 6 - benzonatate (TESSALON) 200 MG capsule; Take 1 capsule (200 mg total) by mouth 3 (three) times daily as needed.  Dispense: 30 capsule; Refill: 0  4. Suspected UTI Worsening symptoms. UA positive. Will treat empirically with Bactrim as below. Continue to push fluids. Urine sent for culture. Will follow up pending C&S results. She is to call if symptoms do not improve or if they worsen.  -  sulfamethoxazole-trimethoprim (BACTRIM DS) 800-160 MG tablet; Take 1 tablet by mouth 2 (two) times daily.  Dispense: 20 tablet; Refill: 0   No follow-ups on file.      Reynolds Bowl, PA-C, have reviewed all documentation for this visit. The documentation on 06/19/20 for the exam, diagnosis, procedures, and orders are all accurate and complete.   Rubye Beach  [facility] [phone] (phone) [phone] (fax)  [place]

## 2020-06-20 LAB — URINALYSIS, MICROSCOPIC ONLY
Casts: NONE SEEN /lpf
RBC, Urine: >30 /hpf — AB (ref 0–2)
WBC, UA: >30 /hpf — AB (ref 0–5)

## 2020-06-22 DIAGNOSIS — C44722 Squamous cell carcinoma of skin of right lower limb, including hip: Secondary | ICD-10-CM | POA: Diagnosis not present

## 2020-06-23 LAB — URINE CULTURE

## 2020-06-26 ENCOUNTER — Telehealth: Payer: Self-pay | Admitting: *Deleted

## 2020-06-26 IMAGING — MR MR CERVICAL SPINE WO/W CM
5 of 8 series · 29 of 48 positions shown · IV contrast (gadavist)
Comparison: CT cervical spine 06/12/2013

CLINICAL DATA: Neck pain. History of cancer. Left shoulder and arm
pain.

EXAM:
MRI CERVICAL SPINE WITHOUT AND WITH CONTRAST
TECHNIQUE: Multiplanar and multiecho pulse sequences of the cervical spine, to
include the craniocervical junction and cervicothoracic junction,
were obtained without and with intravenous contrast.
CONTRAST:  5mL GADAVIST GADOBUTROL 1 MMOL/ML IV SOLN

[Series 5: T2 · sagittal · 3.0mm · 0.62mm/px · 4 of 15 slices shown (1 of 2)]
[im 1/15]
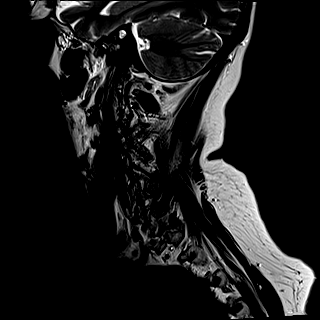
[im 5/15]
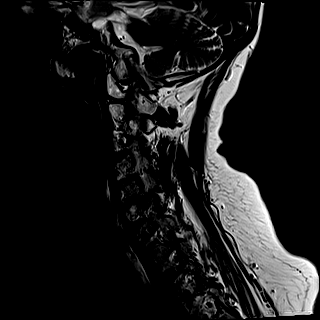
[im 10/15]
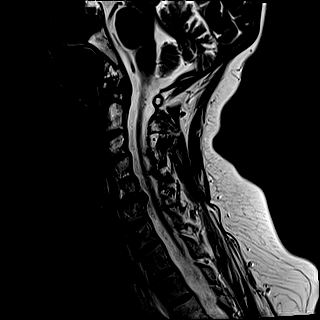
[im 15/15]
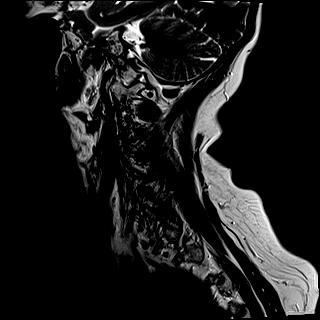

[Series 7: STIR · sagittal · 3.0mm · 0.62mm/px · 4 of 15 slices shown]
[im 1/15]
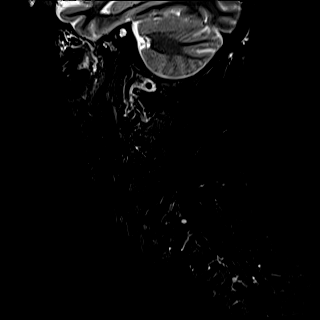
[im 5/15]
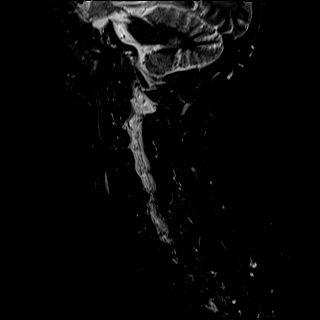
[im 10/15]
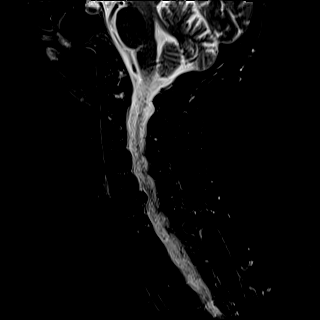
[im 15/15]
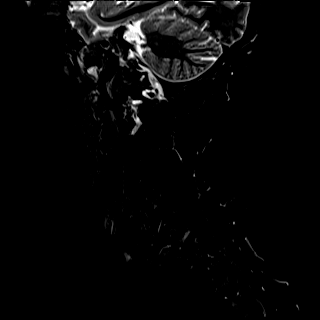

[Series 8: T2 · axial · 3.0mm · 0.70mm/px · z∈[-138,-42]mm · 8 of 29 slices shown (2 of 2)]
[im 1/29]
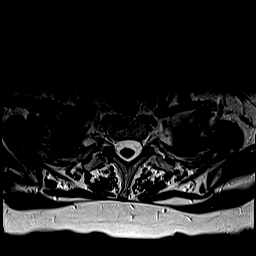
[im 5/29]
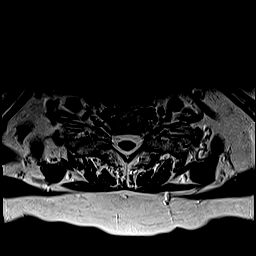
[im 9/29]
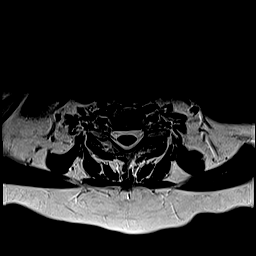
[im 13/29]
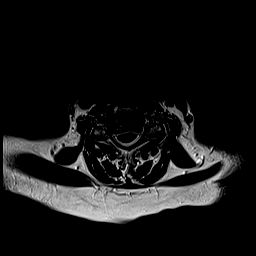
[im 17/29]
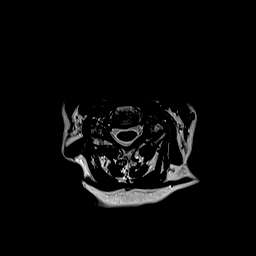
[im 21/29]
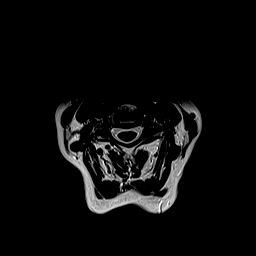
[im 25/29]
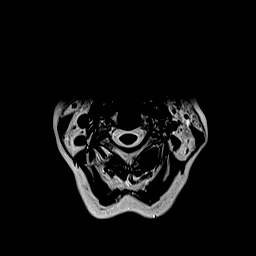
[im 29/29]
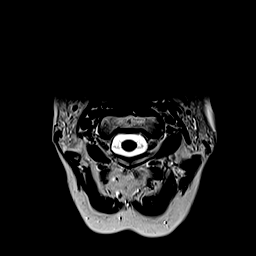

[Series 10: T1 · axial · non-contrast · 3.0mm · 0.35mm/px · z∈[-138,-42]mm · 8 of 29 slices shown]
[im 1/29]
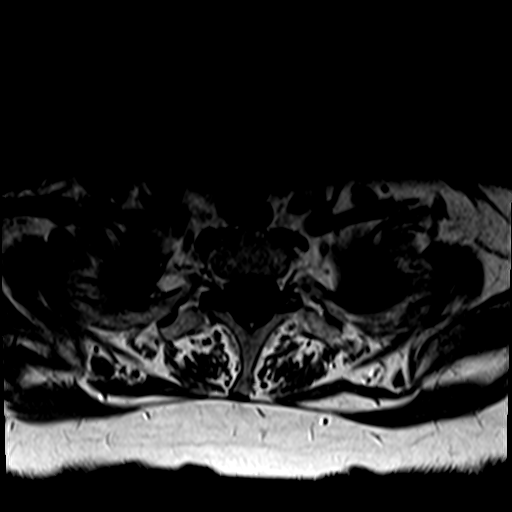
[im 5/29]
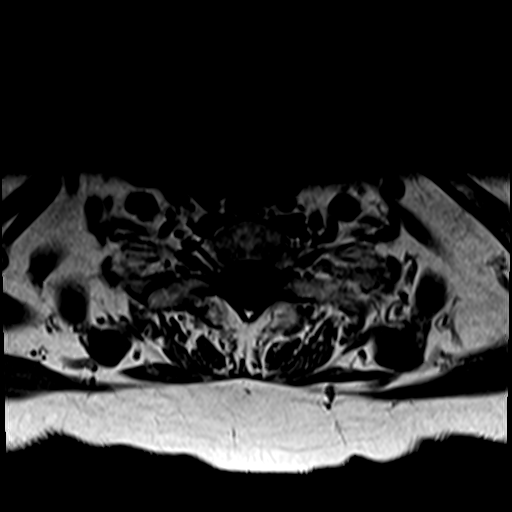
[im 9/29]
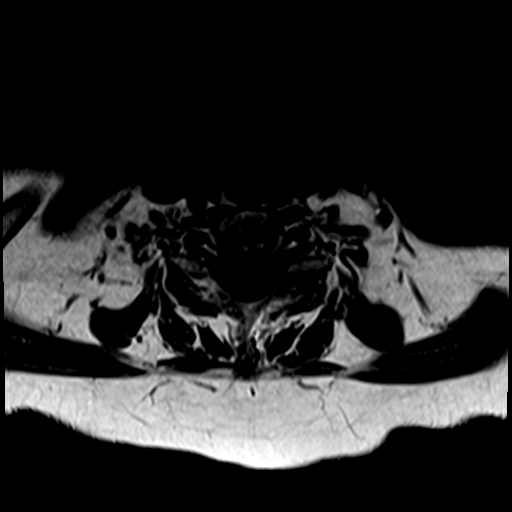
[im 13/29]
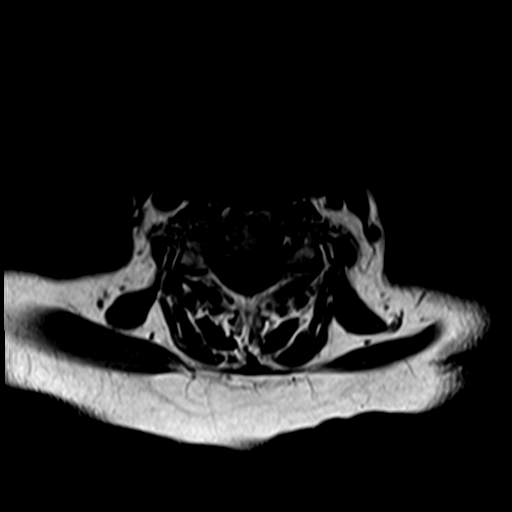
[im 17/29]
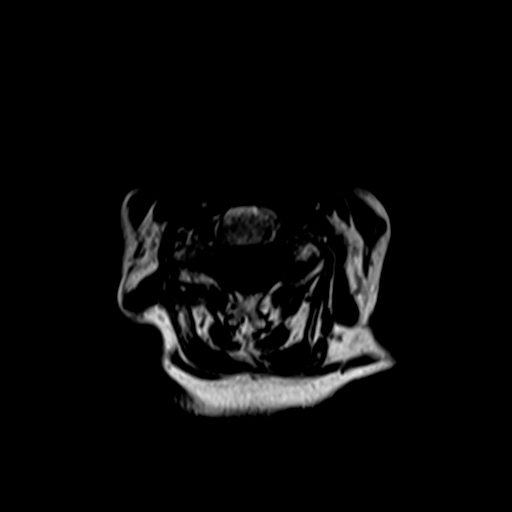
[im 21/29]
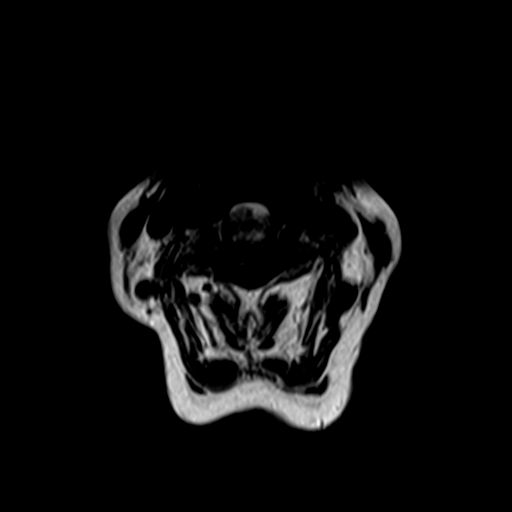
[im 25/29]
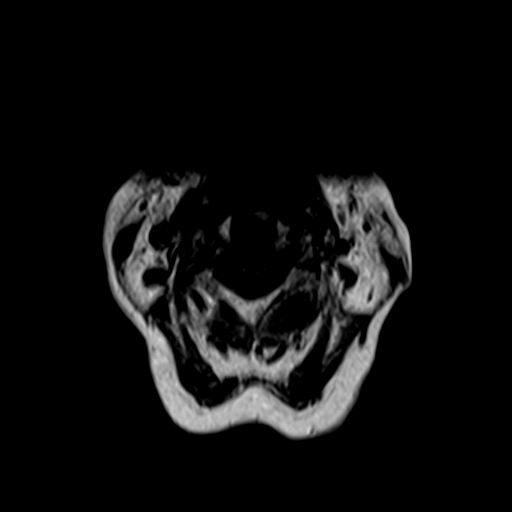
[im 29/29]
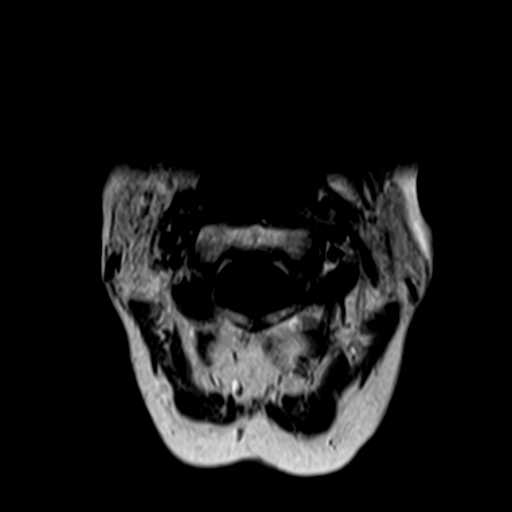

[Series 12: T1 post-contrast · axial · 3.0mm · 0.35mm/px · z∈[-138,-83]mm · 5 of 29 slices shown]
[im 1/29]
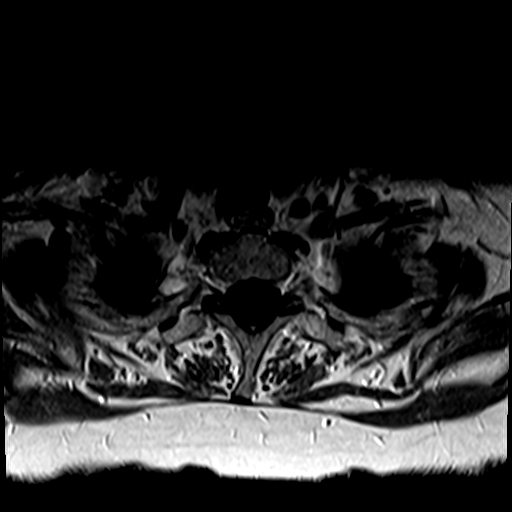
[im 5/29]
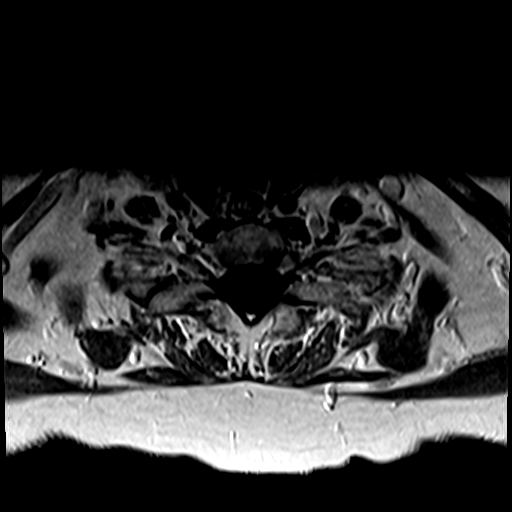
[im 9/29]
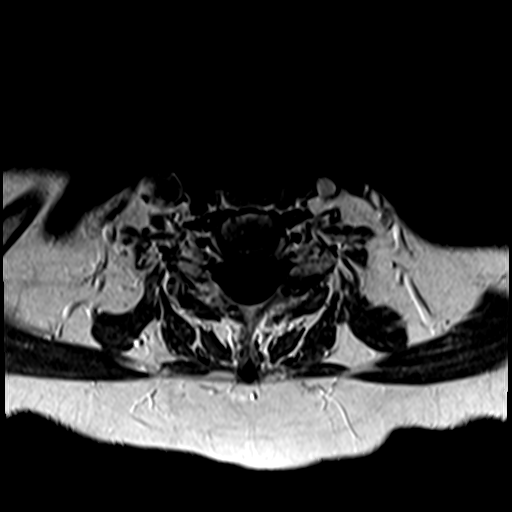
[im 13/29]
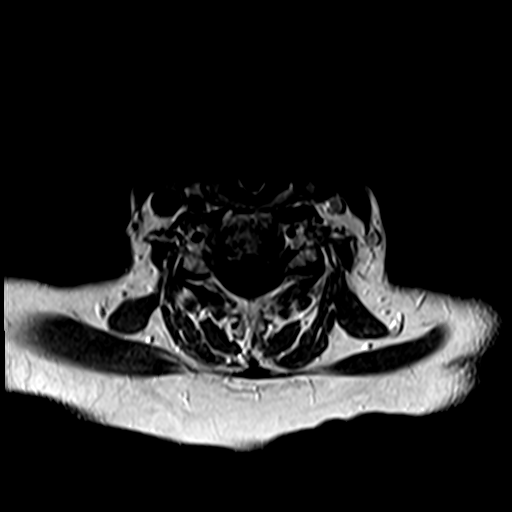
[im 17/29]
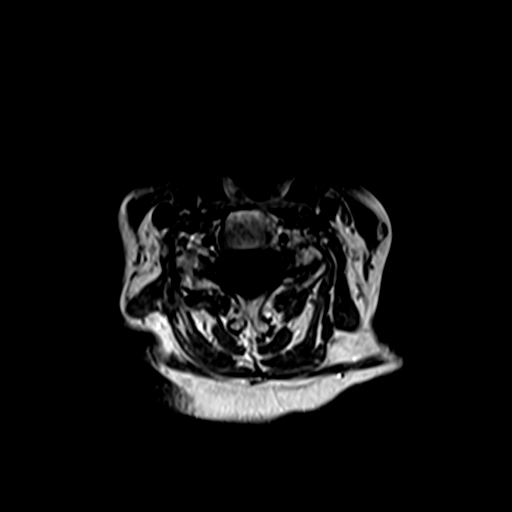

[29 of 48 positions shown; findings below may reference images not displayed]

FINDINGS: Alignment: 3 mm anterolisthesis C3-4. Mild anterolisthesis C4-5 and
mild retrolisthesis C5-6.

Vertebrae: Negative for fracture or mass. Negative for metastatic
disease.

Cord: Normal cord signal.  Normal cord enhancement.

Posterior Fossa, vertebral arteries, paraspinal tissues: Negative
for mass or adenopathy. No soft tissue edema or fluid collection.

Disc levels:

C2-3: Negative

C3-4: Mild disc degeneration and mild spurring without stenosis.

C4-5: Mild disc and facet degeneration without stenosis

C5-6: Disc degeneration and spondylosis. Moderate uncinate spurring
diffusely, right greater than left. Cord flattening with mild spinal
stenosis. Moderate foraminal stenosis bilaterally due to spurring

C6-7: Negative

C7-T1: Negative
IMPRESSION: 1. Negative for metastatic disease.  No acute abnormality
2. Disc degeneration and spondylosis most prominent C5-6 causing
spinal and foraminal stenosis.

## 2020-06-26 NOTE — Telephone Encounter (Signed)
Pt called and said that she feels a knot on her right breast which is the side she has breast cancer on. It is sore and the knot is movable. I spoke to Janese Banks and she said to add her on for symptom management. I spoke to Lauren and she will be happy to see the pt. I called the pt. And she is agreeable to come in on Monday 2/28 at 9 am and see NP.

## 2020-06-29 ENCOUNTER — Other Ambulatory Visit: Payer: Self-pay

## 2020-06-29 ENCOUNTER — Telehealth: Payer: Self-pay

## 2020-06-29 ENCOUNTER — Encounter: Payer: Self-pay | Admitting: Nurse Practitioner

## 2020-06-29 ENCOUNTER — Inpatient Hospital Stay: Payer: Medicare HMO | Attending: Nurse Practitioner | Admitting: Nurse Practitioner

## 2020-06-29 VITALS — BP 121/71 | HR 105 | Temp 99.2°F | Resp 19 | Wt 146.0 lb

## 2020-06-29 DIAGNOSIS — Z08 Encounter for follow-up examination after completed treatment for malignant neoplasm: Secondary | ICD-10-CM

## 2020-06-29 DIAGNOSIS — Z9221 Personal history of antineoplastic chemotherapy: Secondary | ICD-10-CM | POA: Diagnosis not present

## 2020-06-29 DIAGNOSIS — Z8249 Family history of ischemic heart disease and other diseases of the circulatory system: Secondary | ICD-10-CM | POA: Insufficient documentation

## 2020-06-29 DIAGNOSIS — N6310 Unspecified lump in the right breast, unspecified quadrant: Secondary | ICD-10-CM | POA: Diagnosis not present

## 2020-06-29 DIAGNOSIS — Z833 Family history of diabetes mellitus: Secondary | ICD-10-CM | POA: Diagnosis not present

## 2020-06-29 DIAGNOSIS — J42 Unspecified chronic bronchitis: Secondary | ICD-10-CM | POA: Diagnosis not present

## 2020-06-29 DIAGNOSIS — Z853 Personal history of malignant neoplasm of breast: Secondary | ICD-10-CM

## 2020-06-29 DIAGNOSIS — Z923 Personal history of irradiation: Secondary | ICD-10-CM | POA: Diagnosis not present

## 2020-06-29 DIAGNOSIS — Z808 Family history of malignant neoplasm of other organs or systems: Secondary | ICD-10-CM | POA: Insufficient documentation

## 2020-06-29 DIAGNOSIS — Z87891 Personal history of nicotine dependence: Secondary | ICD-10-CM | POA: Insufficient documentation

## 2020-06-29 DIAGNOSIS — Z79899 Other long term (current) drug therapy: Secondary | ICD-10-CM | POA: Diagnosis not present

## 2020-06-29 MED ORDER — IPRATROPIUM-ALBUTEROL 20-100 MCG/ACT IN AERS: 1.0000 | INHALATION_SPRAY | Freq: Four times a day (QID) | RESPIRATORY_TRACT | 1 refills | Status: DC

## 2020-06-29 NOTE — Telephone Encounter (Signed)
Called and scheduled breast imaging with [facility] for March 9th at 11:00. Patient notified of appointment and verbalized understanding.

## 2020-06-29 NOTE — Progress Notes (Signed)
Symptom Management [place]  [phone] [phone]  Patient Care Team: Chrismon, Driscilla Grammes as PCP - General (Physician Assistant) Sindy Guadeloupe, MD as Consulting Physician (Oncology) Jules Husbands, MD as Consulting Physician (General Surgery) Dasher, Rayvon Char, MD (Dermatology) Scheeler, Carola Rhine, PA-C as Physician Assistant (Plastic Surgery) Harvest Dark, MD as Attending Physician (Emergency Medicine) Vertell Limber (Neurology) Marlowe Sax, MD as Referring Physician (Internal Medicine)   Name of the patient: Denise Watts  [id number]  01/06/1956   Date of visit: 06/29/20  Diagnosis- Breast Cancer  Chief complaint/ Reason for visit- New breast lump  Heme/Onc history:  Oncology History Overview Note  1. Carcinoma of breast, T1, N0, M0 tumor diagnosis.  In January of 1997 2. Carcinoma of the vocal cord T1, N0, M0 tumor.  Status postradiation therapy 3. Abnormal CT scan of the chest (December, 2015) 4. Repeat CT scan of chest shows stable nodule (July, 2016) 5. Neutropenia with normal hemoglobin and normal platelet count (July, 2016)   Cancer of vocal cord [facility])  11/10/2014 Initial Diagnosis   Cancer of vocal cord   Malignant neoplasm of lower-outer quadrant of right breast of female, estrogen receptor negative ([place])  11/24/2017 Initial Diagnosis   Malignant neoplasm of lower-outer quadrant of right breast of female, estrogen receptor negative ([place])   01/12/2018 Cancer Staging   Staging form: Breast, AJCC 8th Edition - Clinical stage from 01/12/2018: Stage IB (cT1a, cN0, cM0, G3, ER+, PR-, HER2-) - Signed by Sindy Guadeloupe, MD on 01/12/2018   01/30/2018 - 04/26/2018 Chemotherapy   The patient had dexamethasone (DECADRON) 4 MG tablet, 8 mg, Oral, 2 times daily, 1 of 1 cycle, Start date: 01/12/2018, End date: 08/03/2018 palonosetron (ALOXI) injection 0.25 mg, 0.25 mg, Intravenous,  Once, 4 of 4  cycles Administration: 0.25 mg (01/30/2018), 0.25 mg (02/27/2018), 0.25 mg (03/28/2018), 0.25 mg (04/26/2018) pegfilgrastim (NEULASTA ONPRO KIT) injection 6 mg, 6 mg, Subcutaneous, Once, 4 of 4 cycles Administration: 6 mg (01/30/2018), 6 mg (02/27/2018), 6 mg (03/28/2018), 6 mg (04/26/2018) cyclophosphamide (CYTOXAN) 1,000 mg in sodium chloride 0.9 % 250 mL chemo infusion, 600 mg/m2 = 1,000 mg, Intravenous,  Once, 4 of 4 cycles Administration: 1,000 mg (01/30/2018), 1,000 mg (02/27/2018), 1,000 mg (03/28/2018), 1,000 mg (04/26/2018) DOCEtaxel (TAXOTERE) 130 mg in sodium chloride 0.9 % 250 mL chemo infusion, 75 mg/m2 = 130 mg, Intravenous,  Once, 4 of 4 cycles Dose modification: 60 mg/m2 (original dose 75 mg/m2, Cycle 2, Reason: Dose not tolerated) Administration: 130 mg (01/30/2018), 100 mg (02/27/2018), 100 mg (03/28/2018), 100 mg (04/26/2018)  for chemotherapy treatment.      Interval history- Patient is 65 year old female with history of clinical stage IB right breast cancer in 1997 and in 2019 s/p lumpectomy and radiation who presents to Symptom Management clinic for complaints of lump in the right breast. Noticed 1 week ago. Painful to touch. Moveable. No redness or skin changes. No recent imaging. She has been struggling with bronchitis for approximately 3 months. Continues to cough. Received multiple rounds of antibiotics. Awaiting appointment with pulmonology.   Previous mammogram 12/16/19 showed large oil cyst with dystrophic calcifications in the lower out quadrant of the right breast.   Review of systems- Review of Systems  Constitutional: Negative for chills, fever, malaise/fatigue and weight loss.  HENT: Negative for hearing loss, nosebleeds, sore throat and tinnitus.   Eyes: Negative for blurred vision and double vision.  Respiratory: Positive for cough and shortness of  breath. Negative for hemoptysis and wheezing.   Cardiovascular: Positive for chest pain (chest wall and right breast  pain). Negative for palpitations and leg swelling.  Gastrointestinal: Negative for abdominal pain, blood in stool, constipation, diarrhea, melena, nausea and vomiting.  Genitourinary: Negative for dysuria and urgency.  Musculoskeletal: Positive for joint pain (left shoulder). Negative for back pain, falls and myalgias.  Skin: Negative for itching and rash.  Neurological: Negative for dizziness, tingling, sensory change, loss of consciousness, weakness and headaches.  Endo/Heme/Allergies: Negative for environmental allergies. Does not bruise/bleed easily.  Psychiatric/Behavioral: Negative for depression. The patient is not nervous/anxious and does not have insomnia.      Current treatment- surveillance  No Known Allergies  Past Medical History:  Diagnosis Date  . Anxiety   . Arthritis    KNEE RIGHT  . Breast cancer [facility]) 1997   right breast lumpectomy with radiation  . Breast cancer ([place]) 11/15/2017   INVASIVE MAMMARY CARCINOMA/ triple negative  . 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[place]) 11/10/2014  . Cough 03/02/2017   INTERMTTENT DRY COUGH  . Depression   . GERD (gastroesophageal reflux disease)   . History of hiatal hernia   . Personal history of chemotherapy   . Personal history of radiation therapy   . Skin cancer   . Throat cancer [facility]) 1998   radiation    Past Surgical History:  Procedure Laterality Date  . BREAST BIOPSY Right 1997   positive  . BREAST BIOPSY Right 11/15/2017   INVASIVE MAMMARY CARCINOMA triple negative  . BREAST LUMPECTOMY Right 1997   2019 also  . BREAST LUMPECTOMY WITH SENTINEL LYMPH NODE BIOPSY Right 12/08/2017   Procedure: BREAST LUMPECTOMY WITH SENTINEL LYMPH NODE BX;  Surgeon: Robert Bellow, MD;  Location: [facility] ORS;  Service: General;  Laterality: Right;  . BREAST REDUCTION WITH MASTOPEXY Left 06/10/2019   Procedure: Left breast mastopexy reduction for symmetry;  Surgeon: Wallace Going, DO;  Location: [place];  Service: Plastics;   Laterality: Left;  total 2.5 hours  . COLONOSCOPY  2015  . COLONOSCOPY WITH PROPOFOL N/A 10/29/2019   Procedure: COLONOSCOPY WITH PROPOFOL;  Surgeon: Lucilla Lame, MD;  Location[facility] ENDOSCOPY;  Service: Endoscopy;  Laterality: N/A;  . ESOPHAGOGASTRODUODENOSCOPY (EGD) WITH PROPOFOL N/A 05/24/2019   Procedure: ESOPHAGOGASTRODUODENOSCOPY (EGD) WITH PROPOFOL;  Surgeon: Lucilla Lame, MD;  Location[facility] ENDOSCOPY;  Service: Endoscopy;  Laterality: N/A;  . ESOPHAGUS SURGERY    . KNEE SURGERY Right   . LIPOSUCTION WITH LIPOFILLING Right 06/10/2019   Procedure: right breast scar and capsule release with fat grafting;  Surgeon: Wallace Going, DO;  Location: [place];  Service: Plastics;  Laterality: Right;  . PORT-A-CATH REMOVAL    . PORTACATH PLACEMENT Right 01/17/2018   Procedure: INSERTION PORT-A-CATH- RIGHT;  Surgeon: Robert Bellow, MD;  Location: [facility] ORS;  Service: General;  Laterality: Right;  . ROBOTIC ADRENALECTOMY Right 12/28/2016   Procedure: ROBOTIC ADRENALECTOMY;  Surgeon: Hollice Espy, MD;  Location: [facility] ORS;  Service: Urology;  Laterality: Right;  . SKIN CANCER EXCISION    . THROAT SURGERY  1998   throat cancer   . TUBAL LIGATION    . VENTRAL HERNIA REPAIR N/A 03/03/2017   Procedure: HERNIA REPAIR VENTRAL ADULT;  Surgeon: Clayburn Pert, MD;  Location: [facility] ORS;  Service: General;  Laterality: N/A;    Social History   Socioeconomic History  . Marital status: Married    Spouse name: Not on file  . Number of  children: 4  . Years of education: Not on file  . Highest education level: 10th grade  Occupational History  . Occupation: retired  Tobacco Use  . Smoking status: Former Smoker    Packs/day: 1.50    Years: 30.00    Pack years: 45.00    Types: Cigarettes    Quit date: 05/02/1996    Years since quitting: 24.1  . Smokeless tobacco: Never Used  Vaping Use  . Vaping Use: Never used  Substance and Sexual Activity  . Alcohol use: Yes     Alcohol/week: 4.0 standard drinks    Types: 4 Shots of liquor per week    Comment: 2 per night on weekends  . Drug use: No  . Sexual activity: Not on file  Other Topics Concern  . Not on file  Social History Narrative  . Not on file   Social Determinants of Health   Financial Resource Strain: Low Risk   . Difficulty of Paying Living Expenses: Not hard at all  Food Insecurity: No Food Insecurity  . Worried About Charity fundraiser in the Last Year: Never true  . Ran Out of Food in the Last Year: Never true  Transportation Needs: No Transportation Needs  . Lack of Transportation (Medical): No  . Lack of Transportation (Non-Medical): No  Physical Activity: Insufficiently Active  . Days of Exercise per Week: 3 days  . Minutes of Exercise per Session: 30 min  Stress: Stress Concern Present  . Feeling of Stress : Rather much  Social Connections: Moderately Isolated  . Frequency of Communication with Friends and Family: More than three times a week  . Frequency of Social Gatherings with Friends and Family: More than three times a week  . Attends Religious Services: Never  . Active Member of Clubs or Organizations: No  . Attends Archivist Meetings: Never  . Marital Status: Married  Human resources officer Violence: Not At Risk  . Fear of Current or Ex-Partner: No  . Emotionally Abused: No  . Physically Abused: No  . Sexually Abused: No    Family History  Problem Relation Age of Onset  . Diabetes Mother   . Heart attack Mother   . Cancer Sister        cancer of eyes, spread to lung, other places. died at 54  . Cancer Sister 67       unknown primary, spread to bone, brain died in 35's  . Cancer Paternal Aunt        unk type  . Cancer Paternal Grandmother        type unk  . Prostate cancer Neg Hx   . Kidney cancer Neg Hx   . Bladder Cancer Neg Hx      Current Outpatient Medications:  .  albuterol (VENTOLIN HFA) 108 (90 Base) MCG/ACT inhaler, Inhale 2 puffs into the  lungs every 6 (six) hours as needed for wheezing or shortness of breath., Disp: 8 g, Rfl: 2 .  benzonatate (TESSALON) 200 MG capsule, Take 1 capsule (200 mg total) by mouth 3 (three) times daily as needed., Disp: 30 capsule, Rfl: 0 .  busPIRone (BUSPAR) 5 MG tablet, Take 1 tablet (5 mg total) by mouth 2 (two) times daily., Disp: 60 tablet, Rfl: 0 .  cetirizine (ZYRTEC) 10 MG tablet, Take 10 mg by mouth daily., Disp: , Rfl:  .  Cyanocobalamin (B-12 COMPLIANCE INJECTION IJ), Inject 1 Dose as directed every 30 (thirty) days., Disp: , Rfl:  .  cyclobenzaprine (FLEXERIL) 5 MG tablet, TAKE 1 TABLET BY MOUTH 3 TIMES DAILY AS NEEDED FOR MUSCLE SPASMS, Disp: 30 tablet, Rfl: 1 .  DULoxetine (CYMBALTA) 60 MG capsule, Take 1 capsule by mouth daily., Disp: , Rfl:  .  fluticasone (FLONASE) 50 MCG/ACT nasal spray, Place 1 spray into both nostrils at bedtime., Disp: 16 g, Rfl: 6 .  gabapentin (NEURONTIN) 300 MG capsule, TAKE 1 CAPSULE BY MOUTH 3 TIMES A DAY, Disp: 60 capsule, Rfl: 1 .  Ipratropium-Albuterol (COMBIVENT) 20-100 MCG/ACT AERS respimat, Inhale 1 puff into the lungs every 6 (six) hours., Disp: 4 g, Rfl: 1 .  omeprazole (PRILOSEC) 40 MG capsule, TAKE 1 CAPSULE BY MOUTH EVERY DAY (MORNING), Disp: 90 capsule, Rfl: 4 .  rOPINIRole (REQUIP) 0.25 MG tablet, Take 0.25 mg by mouth 3 (three) times daily. , Disp: , Rfl:  .  sulfamethoxazole-trimethoprim (BACTRIM DS) 800-160 MG tablet, Take 1 tablet by mouth 2 (two) times daily., Disp: 20 tablet, Rfl: 0 .  ondansetron (ZOFRAN) 4 MG tablet, Take 1 tablet (4 mg total) by mouth every 8 (eight) hours as needed for nausea or vomiting. (Patient not taking: Reported on 06/29/2020), Disp: 20 tablet, Rfl: 0 No current facility-administered medications for this visit.  Facility-Administered Medications Ordered in Other Visits:  .  cyanocobalamin ((VITAMIN B-12)) injection 1,000 mcg, 1,000 mcg, Intramuscular, Q30 days, Sindy Guadeloupe, MD, 1,000 mcg at 08/03/18 0936 .   cyanocobalamin ((VITAMIN B-12)) injection 1,000 mcg, 1,000 mcg, Intramuscular, Q30 days, Sindy Guadeloupe, MD, 1,000 mcg at 09/28/18 1013 .  cyanocobalamin ((VITAMIN B-12)) injection 1,000 mcg, 1,000 mcg, Intramuscular, Q30 days, Sindy Guadeloupe, MD, 1,000 mcg at 11/30/18 1355 .  cyanocobalamin ((VITAMIN B-12)) injection 1,000 mcg, 1,000 mcg, Intramuscular, Q30 days, Sindy Guadeloupe, MD, 1,000 mcg at 08/16/19 1144  Physical exam:  Vitals:   06/29/20 0930  BP: 121/71  Pulse: (!) 105  Resp: 19  Temp: 99.2 F (37.3 C)  TempSrc: Tympanic  SpO2: 96%  Weight: 146 lb (66.2 kg)   Physical Exam Constitutional:      Appearance: She is well-developed and well-nourished.  HENT:     Head: Atraumatic.     Nose: Nose normal. No rhinorrhea.     Mouth/Throat:     Mouth: Oropharynx is clear and moist.     Pharynx: No oropharyngeal exudate.  Eyes:     General: No scleral icterus. Cardiovascular:     Rate and Rhythm: Normal rate and regular rhythm.  Pulmonary:     Effort: Pulmonary effort is normal. No respiratory distress.     Breath sounds: Normal breath sounds.     Comments: Cough Chest:     Chest wall: Tenderness present. No mass, lacerations, deformity or crepitus.  Breasts:     Right: Mass (~ 3 cm x 2 cm right breast mass in the right lower quadrant. tender. movable. seems fluid filled) and tenderness present. No nipple discharge, axillary adenopathy or supraclavicular adenopathy.     Left: Tenderness present. No mass, nipple discharge, axillary adenopathy or supraclavicular adenopathy.    Abdominal:     General: There is no distension.     Tenderness: There is no abdominal tenderness.  Musculoskeletal:        General: No edema. Normal range of motion.     Cervical back: Normal range of motion and neck supple. No rigidity.  Lymphadenopathy:     Cervical: No cervical adenopathy.     Upper Body:     Right upper body: No  supraclavicular, axillary or pectoral adenopathy.     Left upper  body: No supraclavicular, axillary or pectoral adenopathy.  Skin:    General: Skin is warm and dry.  Neurological:     Mental Status: She is alert and oriented to person, place, and time.     Motor: No weakness.  Psychiatric:        Mood and Affect: Mood and affect and mood normal.        Behavior: Behavior normal.      CMP Latest Ref Rng & Units 02/20/2020  Glucose 70 - 99 mg/dL 94  BUN 8 - 23 mg/dL 12  Creatinine 0.44 - 1.00 mg/dL 0.52  Sodium 135 - 145 mmol/L 139  Potassium 3.5 - 5.1 mmol/L 4.2  Chloride 98 - 111 mmol/L 102  CO2 22 - 32 mmol/L 29  Calcium 8.9 - 10.3 mg/dL 8.5(L)  Total Protein 6.5 - 8.1 g/dL 6.4(L)  Total Bilirubin 0.3 - 1.2 mg/dL 0.6  Alkaline Phos 38 - 126 U/L 66  AST 15 - 41 U/L 20  ALT 0 - 44 U/L 14   CBC Latest Ref Rng & Units 02/20/2020  WBC 4.0 - 10.5 K/uL 5.2  Hemoglobin 12.0 - 15.0 g/dL 14.1  Hematocrit 36.0 - 46.0 % 42.2  Platelets 150 - 400 K/uL 249    No images are attached to the encounter.  DG Chest 2 View  Result Date: 06/09/2020 CLINICAL DATA:  Chronic cough. History of breast and throat carcinoma EXAM: CHEST - 2 VIEW COMPARISON:  October 31, 2018 chest radiograph; chest CT August 14, 2019 FINDINGS: There is again noted a nodular opacity in the left mid lung measuring 1.1 x 1.0 cm, stable. There is mild left base atelectasis. Lungs elsewhere are clear. Heart size and pulmonary vascular normal. No adenopathy. There is aortic atherosclerosis. Surgical clips in right breast region noted. IMPRESSION: Persistent nodular opacity left mid lung, not appreciably changed compared to prior CT examination. No new opacity evident beyond mild left base atelectasis. Heart size normal. No adenopathy. Aortic Atherosclerosis (ICD10-I70.0). Electronically Signed   By: Lowella Grip III M.D.   On: 06/09/2020 08:58    Assessment and plan- Patient is a 65 y.o. female with history of breast cancer s/p lumpectomy who presents to Symptom Management Clinic for right  breast mass. Etiology unclear. Question oil cyst based on findings of previous mammogram vs seroma (late appearing however) vs infection vs malignancy vs others. Will refer for ultrasound and biopsy with cytology for further evaluation.   Cough- causing chest wall tenderness, fatigue. Will add combivent inhaler; may switch to nebulizer d/t insurance. Previous opacity in left mid lung on CT that was essentially stable. If persistent symptoms however, consider CT Chest. Patient is scheduled to see pulmonology for evaluation and management.   Follow up with Dr. Janese Banks as scheduled. Return to symptom management clinic if symptoms don't improve or worsen in the interim.    Visit Diagnosis 1. Lump of right breast   2. Chronic bronchitis, unspecified chronic bronchitis type [facility])     Patient expressed understanding and was in agreement with this plan. She also understands that She can call clinic at any time with any questions, concerns, or complaints.   Thank you for allowing me to participate in the care of this very pleasant patient.   Beckey Rutter, DNP, AGNP-C [place] [facility] [place]

## 2020-07-02 ENCOUNTER — Other Ambulatory Visit: Payer: Self-pay | Admitting: Family Medicine

## 2020-07-02 NOTE — Telephone Encounter (Signed)
Requested medication (s) are due for refill today: yes  Requested medication (s) are on the active medication list: yes  Last refill:  05/12/20 #30 1 refill  Future visit scheduled: no  Notes to clinic:  not delegated per protocol     Requested Prescriptions  Pending Prescriptions Disp Refills   cyclobenzaprine (FLEXERIL) 5 MG tablet [Pharmacy Med Name: CYCLOBENZAPRINE HCL 5 MG TAB] 30 tablet 1    Sig: TAKE 1 TABLET BY MOUTH 3 TIMES DAILY AS NEEDED FOR MUSCLE SPASMS      Not Delegated - Analgesics:  Muscle Relaxants Failed - 07/02/2020 11:34 AM      Failed - This refill cannot be delegated      Passed - Valid encounter within last 6 months    Recent Outpatient Visits           1 week ago [place], [place], [place]   3 weeks ago Cough   [place], [place]   2 months ago COPD exacerbation [facility])   [place], Vickki Muff, PA-C   4 months ago [place], PA-C   4 months ago [place] Chrismon, Vickki Muff, [place]

## 2020-07-06 DIAGNOSIS — C44612 Basal cell carcinoma of skin of right upper limb, including shoulder: Secondary | ICD-10-CM | POA: Diagnosis not present

## 2020-07-08 ENCOUNTER — Ambulatory Visit
Admission: RE | Admit: 2020-07-08 | Discharge: 2020-07-08 | Disposition: A | Discharge status: Home / Self Care | Payer: Medicare HMO | Source: Ambulatory Visit | Attending: Nurse Practitioner | Admitting: Nurse Practitioner

## 2020-07-08 ENCOUNTER — Other Ambulatory Visit: Payer: Self-pay | Admitting: Nurse Practitioner

## 2020-07-08 ENCOUNTER — Other Ambulatory Visit: Payer: Self-pay

## 2020-07-08 DIAGNOSIS — Z853 Personal history of malignant neoplasm of breast: Secondary | ICD-10-CM

## 2020-07-08 DIAGNOSIS — Z08 Encounter for follow-up examination after completed treatment for malignant neoplasm: Secondary | ICD-10-CM

## 2020-07-08 DIAGNOSIS — R928 Other abnormal and inconclusive findings on diagnostic imaging of breast: Secondary | ICD-10-CM

## 2020-07-08 DIAGNOSIS — N6001 Solitary cyst of right breast: Secondary | ICD-10-CM | POA: Diagnosis not present

## 2020-07-08 DIAGNOSIS — N6310 Unspecified lump in the right breast, unspecified quadrant: Secondary | ICD-10-CM

## 2020-07-09 NOTE — Progress Notes (Signed)
Wait to see what it is

## 2020-07-14 ENCOUNTER — Other Ambulatory Visit: Payer: Self-pay | Admitting: Nurse Practitioner

## 2020-07-14 DIAGNOSIS — N6310 Unspecified lump in the right breast, unspecified quadrant: Secondary | ICD-10-CM

## 2020-07-14 DIAGNOSIS — R928 Other abnormal and inconclusive findings on diagnostic imaging of breast: Secondary | ICD-10-CM

## 2020-07-17 ENCOUNTER — Telehealth: Payer: Self-pay | Admitting: Family Medicine

## 2020-07-17 NOTE — Telephone Encounter (Signed)
Would need appt.

## 2020-07-17 NOTE — Telephone Encounter (Addendum)
Pt states she really never got better from the last appt she had with Anderson Malta. (06/19/20) Pt states she still has the cough and congestion, along with her urinary tract infection. Pt states her urine smells, urinary frequency, along with pressure. Pt states the sulfamethoxazole-trimethoprim (BACTRIM DS) 800-160 MG tablet gave her mouth sores and she doesn't want to take this anymore.  It did not work.  Pt states the cough is deep, and she is just not any better. OTC mucinex and cough syrups are just not working either. Please advise.  Pt declined appt at this time.  Asked if I could just send a message.

## 2020-07-20 ENCOUNTER — Ambulatory Visit
Admission: RE | Admit: 2020-07-20 | Discharge: 2020-07-20 | Disposition: A | Discharge status: Home / Self Care | Payer: Medicare HMO | Source: Ambulatory Visit | Attending: Nurse Practitioner | Admitting: Nurse Practitioner

## 2020-07-20 ENCOUNTER — Other Ambulatory Visit: Payer: Self-pay

## 2020-07-20 DIAGNOSIS — N6310 Unspecified lump in the right breast, unspecified quadrant: Secondary | ICD-10-CM | POA: Insufficient documentation

## 2020-07-20 DIAGNOSIS — R928 Other abnormal and inconclusive findings on diagnostic imaging of breast: Secondary | ICD-10-CM | POA: Insufficient documentation

## 2020-07-20 DIAGNOSIS — N641 Fat necrosis of breast: Secondary | ICD-10-CM | POA: Diagnosis not present

## 2020-07-20 DIAGNOSIS — N6315 Unspecified lump in the right breast, overlapping quadrants: Secondary | ICD-10-CM | POA: Diagnosis not present

## 2020-07-20 DIAGNOSIS — N6311 Unspecified lump in the right breast, upper outer quadrant: Secondary | ICD-10-CM | POA: Diagnosis not present

## 2020-07-20 HISTORY — PX: BREAST BIOPSY: SHX20

## 2020-07-21 LAB — SURGICAL PATHOLOGY

## 2020-07-21 NOTE — Telephone Encounter (Signed)
Left detailed message for patient to call back and schedule appt. PEC please schedule appt.

## 2020-07-22 ENCOUNTER — Telehealth: Payer: Self-pay | Admitting: *Deleted

## 2020-07-22 NOTE — Telephone Encounter (Signed)
Diagnostic Radiology,Linda called reporting that Patient breast biopsy shows fat necrosis and fibrosis. They have tried several times to contact patient and she is not answering or returning voice mail messages they leave. She is requesting that our office contact the patient with results

## 2020-07-23 NOTE — Telephone Encounter (Signed)
Can you please reach out to her? Also send my chart emssage

## 2020-07-24 ENCOUNTER — Encounter: Payer: Self-pay | Admitting: Physician Assistant

## 2020-07-24 ENCOUNTER — Telehealth: Payer: Self-pay

## 2020-07-24 ENCOUNTER — Telehealth (INDEPENDENT_AMBULATORY_CARE_PROVIDER_SITE_OTHER): Payer: Medicare HMO | Admitting: Physician Assistant

## 2020-07-24 DIAGNOSIS — K121 Other forms of stomatitis: Secondary | ICD-10-CM

## 2020-07-24 DIAGNOSIS — R059 Cough, unspecified: Secondary | ICD-10-CM | POA: Diagnosis not present

## 2020-07-24 DIAGNOSIS — J069 Acute upper respiratory infection, unspecified: Secondary | ICD-10-CM | POA: Diagnosis not present

## 2020-07-24 DIAGNOSIS — N3000 Acute cystitis without hematuria: Secondary | ICD-10-CM | POA: Diagnosis not present

## 2020-07-24 MED ORDER — BENZONATATE 200 MG PO CAPS: 200.0000 mg | ORAL_CAPSULE | Freq: Three times a day (TID) | ORAL | 0 refills | Status: DC | PRN

## 2020-07-24 MED ORDER — PREDNISONE 10 MG PO TABS: ORAL_TABLET | ORAL | 0 refills | Status: DC

## 2020-07-24 MED ORDER — LIDOCAINE 4 % EX SOLN
1.0000 | Freq: Three times a day (TID) | CUTANEOUS | 0 refills | Status: DC | PRN
Start: 2020-07-24 — End: 2020-10-19

## 2020-07-24 MED ORDER — AMOXICILLIN-POT CLAVULANATE 875-125 MG PO TABS: 1.0000 | ORAL_TABLET | Freq: Two times a day (BID) | ORAL | 0 refills | Status: DC

## 2020-07-24 NOTE — Progress Notes (Signed)
MyChart Video Visit    Virtual Visit via Video Note   This visit type was conducted due to national recommendations for restrictions regarding the COVID-19 Pandemic (e.g. social distancing) in an effort to limit this patient's exposure and mitigate transmission in our community. This patient is at least at moderate risk for complications without adequate follow up. This format is felt to be most appropriate for this patient at this time. Physical exam was limited by quality of the video and audio technology used for the visit.   Patient location: Home Provider location: [facility]  I discussed the limitations of evaluation and management by telemedicine and the availability of in person appointments. The patient expressed understanding and agreed to proceed.  Patient: Denise Watts   DOB: 29-Apr-1956   65 y.o. Female  MRN: [id number] Visit Date: 07/24/2020  Today's healthcare provider: Mar Daring, PA-C   No chief complaint on file.  Subjective    Cough This is a recurrent problem. The current episode started 1 to 4 weeks ago. The problem has been gradually worsening. The problem occurs hourly. The cough is non-productive. Associated symptoms include myalgias, nasal congestion, postnasal drip, rhinorrhea and a sore throat. Pertinent negatives include no chest pain, chills, ear congestion, ear pain, fever, headaches, shortness of breath, sweats, weight loss or wheezing. The symptoms are aggravated by lying down. She has tried a beta-agonist inhaler and prescription cough suppressant for the symptoms. The treatment provided mild relief.  Urinary Tract Infection  This is a recurrent problem. The current episode started 1 to 4 weeks ago. The problem occurs intermittently. The problem has been gradually worsening. The quality of the pain is described as aching and burning. The pain is at a severity of 3/10. The pain is mild. There has been no fever. She is not sexually active. There is no  history of pyelonephritis. Associated symptoms include frequency and nausea. Pertinent negatives include no chills, flank pain, hematuria, sweats or vomiting. She has tried antibiotics (bactrim) for the symptoms. The treatment provided no relief. Her past medical history is significant for recurrent UTIs.      Patient Active Problem List   Diagnosis Date Noted  . Encounter for screening colonoscopy   . Polyp of transverse colon   . Dysphagia   . Stricture and stenosis of esophagus   . Cervical spondylosis 03/18/2019  . 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 Need for prophylactic vaccination and inoculation against influenza 01/25/2019  . Postoperative breast asymmetry 01/04/2019  . Status post partial mastectomy 01/04/2019  . Vitamin D deficiency 12/12/2018  . Bursitis of left shoulder 11/21/2018  . Rotator cuff tendinitis, left 11/21/2018  . Genetic testing 02/01/2018  . Skin cancer 01/18/2018  . Goals of care, counseling/discussion 01/12/2018  . B12 deficiency 12/28/2017  . Malignant neoplasm of lower-outer quadrant of right breast of female, estrogen receptor negative ([place]) 11/24/2017  . Chronic abdominal pain (Primary Area of Pain)  04/03/2017  . Chronic low back pain (Secondary Area of Pain) (B) (R>L) 04/03/2017  . Chronic knee pain [facility] Area of Pain) (R) 04/03/2017  . Chronic pain syndrome 04/03/2017  . Disorder of bone, unspecified 04/03/2017  . Other specified health status 04/03/2017  . Other long term (current) drug therapy 04/03/2017  . Long term current use of opiate analgesic 04/03/2017  . Chronic sacroiliac joint pain 04/03/2017  . S/P repair of ventral hernia 03/03/2017  . Incisional hernia, without obstruction or gangrene 02/13/2017  . Right adrenal mass ([place]) 12/28/2016  .  RIGHT Adrenal Mass 12/16/2016  . Fatigue 02/17/2016  . Other fatigue 02/17/2016  . Multiple lung nodules on CT 02/17/2016  . History of right breast cancer 02/17/2016  . Peripheral  neuropathy 02/17/2016  . Closed fracture of a rib 02/02/2015  . History of fundoplication 53/66/4403  . History of throat cancer 01/15/2015  . Acid reflux 01/15/2015  . Gonalgia 01/15/2015  . Closed fracture of pubis ([place]) 01/15/2015  . Pelvic fracture ([place]) 01/12/2015  . 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) 11/10/2014  . Herpes simplex virus (HSV) epithelial keratitis 03/06/2013  . Nuclear sclerotic cataract 03/06/2013  . Onychia of finger 10/23/2008  . Odontogenic tumor 03/13/2008  . Tobacco use 06/14/2007   Past Surgical History:  Procedure Laterality Date  . BREAST BIOPSY Right 1997   positive  . BREAST BIOPSY Right 11/15/2017   INVASIVE MAMMARY CARCINOMA triple negative  . BREAST BIOPSY Right 07/20/2020   [place] bx, Q marker, path pending  . BREAST LUMPECTOMY Right 1997   2019 also  . BREAST LUMPECTOMY WITH SENTINEL LYMPH NODE BIOPSY Right 12/08/2017   Procedure: BREAST LUMPECTOMY WITH SENTINEL LYMPH NODE BX;  Surgeon: Robert Bellow, MD;  Location: [facility];  Service: General;  Laterality: Right;  . BREAST REDUCTION WITH MASTOPEXY Left 06/10/2019   Procedure: Left breast mastopexy reduction for symmetry;  Surgeon: Wallace Going, DO;  Location: [place];  Service: Plastics;  Laterality: Left;  total 2.5 hours  . COLONOSCOPY  2015  . COLONOSCOPY WITH PROPOFOL N/A 10/29/2019   Procedure: COLONOSCOPY WITH PROPOFOL;  Surgeon: Lucilla Lame, MD;  Location: [facility] ENDOSCOPY;  Service: Endoscopy;  Laterality: N/A;  . ESOPHAGOGASTRODUODENOSCOPY (EGD) WITH PROPOFOL N/A 05/24/2019   Procedure: ESOPHAGOGASTRODUODENOSCOPY (EGD) WITH PROPOFOL;  Surgeon: Lucilla Lame, MD;  Location: [facility] ENDOSCOPY;  Service: Endoscopy;  Laterality: N/A;  . ESOPHAGUS SURGERY    . KNEE SURGERY Right   . LIPOSUCTION WITH LIPOFILLING Right 06/10/2019   Procedure: right breast scar and capsule release with fat grafting;  Surgeon: Wallace Going, DO;  Location: [place];  Service:  Plastics;  Laterality: Right;  . PORT-A-CATH REMOVAL    . PORTACATH PLACEMENT Right 01/17/2018   Procedure: INSERTION PORT-A-CATH- RIGHT;  Surgeon: Robert Bellow, MD;  Location: [facility];  Service: General;  Laterality: Right;  . ROBOTIC ADRENALECTOMY Right 12/28/2016   Procedure: ROBOTIC ADRENALECTOMY;  Surgeon: Hollice Espy, MD;  Location: [facility];  Service: Urology;  Laterality: Right;  . SKIN CANCER EXCISION    . THROAT SURGERY  1998   throat cancer   . TUBAL LIGATION    . VENTRAL HERNIA REPAIR N/A 03/03/2017   Procedure: HERNIA REPAIR VENTRAL ADULT;  Surgeon: Clayburn Pert, MD;  Location: [facility];  Service: General;  Laterality: N/A;   Social History   Tobacco Use  . Smoking status: Former Smoker    Packs/day: 1.50    Years: 30.00    Pack years: 45.00    Types: Cigarettes    Quit date: 05/02/1996    Years since quitting: 24.2  . Smokeless tobacco: Never Used  Vaping Use  . Vaping Use: Never used  Substance Use Topics  . Alcohol use: Yes    Alcohol/week: 4.0 standard drinks    Types: 4 Shots of liquor per week    Comment: 2 per night on weekends  . Drug use: No   No Known Allergies  Medications: Outpatient Medications Prior to Visit  Medication Sig  . albuterol (VENTOLIN HFA) 108 (90  Base) MCG/ACT inhaler Inhale 2 puffs into the lungs every 6 (six) hours as needed for wheezing or shortness of breath.  . benzonatate (TESSALON) 200 MG capsule Take 1 capsule (200 mg total) by mouth 3 (three) times daily as needed.  . busPIRone (BUSPAR) 5 MG tablet Take 1 tablet (5 mg total) by mouth 2 (two) times daily.  . cetirizine (ZYRTEC) 10 MG tablet Take 10 mg by mouth daily.  . Cyanocobalamin (B-12 COMPLIANCE INJECTION IJ) Inject 1 Dose as directed every 30 (thirty) days.  . cyclobenzaprine (FLEXERIL) 5 MG tablet TAKE 1 TABLET BY MOUTH 3 TIMES DAILY AS NEEDED FOR MUSCLE SPASMS  . DULoxetine (CYMBALTA) 60 MG capsule Take 1 capsule by mouth daily.  . fluticasone (FLONASE) 50  MCG/ACT nasal spray Place 1 spray into both nostrils at bedtime.  . gabapentin (NEURONTIN) 300 MG capsule TAKE 1 CAPSULE BY MOUTH 3 TIMES A DAY  . Ipratropium-Albuterol (COMBIVENT) 20-100 MCG/ACT AERS respimat Inhale 1 puff into the lungs every 6 (six) hours.  Marland Kitchen omeprazole (PRILOSEC) 40 MG capsule TAKE 1 CAPSULE BY MOUTH EVERY DAY (MORNING)  . ondansetron (ZOFRAN) 4 MG tablet Take 1 tablet (4 mg total) by mouth every 8 (eight) hours as needed for nausea or vomiting. (Patient not taking: Reported on 06/29/2020)  . rOPINIRole (REQUIP) 0.25 MG tablet Take 0.25 mg by mouth 3 (three) times daily.   Marland Kitchen sulfamethoxazole-trimethoprim (BACTRIM DS) 800-160 MG tablet Take 1 tablet by mouth 2 (two) times daily.   Facility-Administered Medications Prior to Visit  Medication Dose Route Frequency Provider  . cyanocobalamin ((VITAMIN B-12)) injection 1,000 mcg  1,000 mcg Intramuscular Q30 days Sindy Guadeloupe, MD  . cyanocobalamin ((VITAMIN B-12)) injection 1,000 mcg  1,000 mcg Intramuscular Q30 days Sindy Guadeloupe, MD  . cyanocobalamin ((VITAMIN B-12)) injection 1,000 mcg  1,000 mcg Intramuscular Q30 days Sindy Guadeloupe, MD  . cyanocobalamin ((VITAMIN B-12)) injection 1,000 mcg  1,000 mcg Intramuscular Q30 days Sindy Guadeloupe, MD    Review of Systems  Constitutional: Positive for fatigue. Negative for chills, fever and weight loss.  HENT: Positive for congestion, mouth sores, postnasal drip, rhinorrhea and sore throat. Negative for ear pain.   Respiratory: Positive for cough. Negative for chest tightness, shortness of breath and wheezing.   Cardiovascular: Negative for chest pain.  Gastrointestinal: Positive for nausea. Negative for vomiting.  Genitourinary: Positive for frequency. Negative for flank pain and hematuria.  Musculoskeletal: Positive for myalgias.  Neurological: Negative for dizziness and headaches.      Objective    There were no vitals taken for this visit.   Physical Exam Vitals  reviewed.  Constitutional:      General: She is not in acute distress.    Appearance: Normal appearance. She is well-developed. She is not ill-appearing.  HENT:     Head: Normocephalic and atraumatic.  Pulmonary:     Effort: Pulmonary effort is normal. No respiratory distress.  Musculoskeletal:     Cervical back: Normal range of motion and neck supple.  Neurological:     Mental Status: She is alert.  Psychiatric:        Mood and Affect: Mood normal.        Behavior: Behavior normal.        Thought Content: Thought content normal.        Judgment: Judgment normal.        Assessment & Plan     1. Cough Worsening. Add prednisone 12 day taper as below. Continue tessalon  perles as this has been helping.  - benzonatate (TESSALON) 200 MG capsule; Take 1 capsule (200 mg total) by mouth 3 (three) times daily as needed.  Dispense: 30 capsule; Refill: 0 - predniSONE (DELTASONE) 10 MG tablet; Take 6 tablets PO on day 1 and day 2, take 5 tablets PO on day 3 and day 4, take 4 tablets PO on day 5 and day 6, take 3 tablets PO on day 7 and day 8, take 2 tablets PO on day 9 and day 10, take one tablet PO on day 11 and day 12.  Dispense: 42 tablet; Refill: 0  2. Upper respiratory tract infection, unspecified type Worsening symptoms that have not responded to OTC medications. Will give augmentin as below. Continue allergy medications. Stay well hydrated and get plenty of rest. Call if no symptom improvement or if symptoms worsen. - amoxicillin-clavulanate (AUGMENTIN) 875-125 MG tablet; Take 1 tablet by mouth 2 (two) times daily.  Dispense: 20 tablet; Refill: 0 - predniSONE (DELTASONE) 10 MG tablet; Take 6 tablets PO on day 1 and day 2, take 5 tablets PO on day 3 and day 4, take 4 tablets PO on day 5 and day 6, take 3 tablets PO on day 7 and day 8, take 2 tablets PO on day 9 and day 10, take one tablet PO on day 11 and day 12.  Dispense: 42 tablet; Refill: 0  3. Acute cystitis without hematuria Urine  culture on 06/19/20 showed sensitive to Augmentin. Will give as below to see if this resolves symptoms. Failed Bactrim even though was sensitive on culture.  - amoxicillin-clavulanate (AUGMENTIN) 875-125 MG tablet; Take 1 tablet by mouth 2 (two) times daily.  Dispense: 20 tablet; Refill: 0  4. Stomatitis Patient reports worsening mouth pain secondary to bactrim use. Will give lidocaine spray as below. Call if worsening.  - Lidocaine 4 % SOLN; Apply 1-2 sprays topically 3 (three) times daily as needed.  Dispense: 100 mL; Refill: 0   No follow-ups on file.     I discussed the assessment and treatment plan with the patient. The patient was provided an opportunity to ask questions and all were answered. The patient agreed with the plan and demonstrated an understanding of the instructions.   The patient was advised to call back or seek an in-person evaluation if the symptoms worsen or if the condition fails to improve as anticipated.  I provided 12 minutes of face-to-face time during this encounter via MyChart Video enabled encounter.  Reynolds Bowl, PA-C, have reviewed all documentation for this visit. The documentation on 07/24/20 for the exam, diagnosis, procedures, and orders are all accurate and complete.  [place] [facility] [phone] (phone) [phone] (fax)  [place]

## 2020-07-24 NOTE — Telephone Encounter (Signed)
Called and clarified with the pharmacist. They will use OTC Chloraseptic spray instead.

## 2020-07-24 NOTE — Telephone Encounter (Signed)
Copied from [place] [id number]. Topic: General - Other >> Jul 24, 2020 10:37 AM Pawlus, Brayton Layman A wrote: Reason for CRM: Pharmacy had a question regarding Lidocaine 4 % SOLN, stated this is not to be used in the mouth? Please call back and clarify.

## 2020-07-27 ENCOUNTER — Telehealth: Payer: Self-pay | Admitting: Nurse Practitioner

## 2020-07-27 DIAGNOSIS — N641 Fat necrosis of breast: Secondary | ICD-10-CM

## 2020-07-27 NOTE — Telephone Encounter (Signed)
Spoke to patient. Reviewed results of pathology which was consistent with fat necrosis, considered benign. Patient has recovered from biopsy well without complication. She currently has a 'bad cold' and is taking antibiotics. Follow up with Dr. Janese Banks as scheduled.

## 2020-08-06 ENCOUNTER — Ambulatory Visit: Payer: Medicare HMO | Admitting: Pulmonary Disease

## 2020-08-06 ENCOUNTER — Other Ambulatory Visit: Payer: Self-pay

## 2020-08-06 ENCOUNTER — Encounter: Payer: Self-pay | Admitting: Pulmonary Disease

## 2020-08-06 VITALS — BP 134/70 | HR 95 | Temp 97.5°F | Ht 61.0 in | Wt 144.8 lb

## 2020-08-06 DIAGNOSIS — R053 Chronic cough: Secondary | ICD-10-CM | POA: Diagnosis not present

## 2020-08-06 DIAGNOSIS — R911 Solitary pulmonary nodule: Secondary | ICD-10-CM | POA: Diagnosis not present

## 2020-08-06 MED ORDER — BREZTRI AEROSPHERE 160-9-4.8 MCG/ACT IN AERO: 2.0000 | INHALATION_SPRAY | Freq: Two times a day (BID) | RESPIRATORY_TRACT | 5 refills | Status: DC

## 2020-08-06 MED ORDER — AZITHROMYCIN 500 MG PO TABS: 500.0000 mg | ORAL_TABLET | Freq: Every day | ORAL | 0 refills | Status: DC

## 2020-08-06 NOTE — Patient Instructions (Signed)
Will arrange for CT chest and pulmonary function test  Will arrange for sputum culture, sputum for AFB and sputum for fungal culture  Complete course of augmentin  Zithromax 500 mg pill daily for 7 days  Breztri two puffs in the morning and two puffs in the evening, and rinse your mouth after each use  Follow up in 2 weeks with Dr. Halford Chessman or Nurse Practitioner

## 2020-08-06 NOTE — Progress Notes (Signed)
[facility] Pul[facility], Critical Care, and Sleep Medicine  Chief Complaint  Patient presents with  . pulmonary consult    Per Fenton Malling, PA- dry cough at times prod with black sputum, sob with exertion and wheezing x72mo. 4 rounds of abx and 1 course of prednisone with no relief in sx.     Constitutional:  BP 134/70 (BP Location: Left Arm, Cuff Size: Normal)   Pulse 95   Temp (!) 97.5 F (36.4 C) (Temporal)   Ht 5\' 1"  (1.549 m)   Wt 144 lb 12.8 oz (65.7 kg)   SpO2 96%   BMI 27.36 kg/m   Past Medical History:  Anxiety, OA, Breast cancer, Vocal cord cancer, Depression, GERD, Hiatal hernia, Parkinson disease, Restless leg syndrome  Past Surgical History:  She  has a past surgical history that includes Skin cancer excision; Knee surgery (Right); Throat surgery (1998); Esophagus surgery; Tubal ligation; Robotic adrenalectomy (Right, 12/28/2016); Ventral hernia repair (N/A, 03/03/2017); Colonoscopy (2015); Breast lumpectomy with sentinel lymph node bx (Right, 12/08/2017); Portacath placement (Right, 01/17/2018); Breast lumpectomy (Right, 1997); Port-a-cath removal; Esophagogastroduodenoscopy (egd) with propofol (N/A, 05/24/2019); Breast reduction with mastopexy (Left, 06/10/2019); Liposuction with lipofilling (Right, 06/10/2019); Colonoscopy with propofol (N/A, 10/29/2019); Breast biopsy (Right, 1997); Breast biopsy (Right, 11/15/2017); and Breast biopsy (Right, 07/20/2020).  Brief Summary:  Denise Watts is a 65 y.o. female former smoker with chronic cough.      Subjective:   She is from [place], but has lived in [place] for years.  She worked at [employer] until 2019.  She quit smoking in 1997.  She denies history of tuberculosis.  She has pet dog and cat.  No history of asthma.  She developed a cough about 5 months ago.  She doesn't recall any specific exposure that triggered this.  She brings up clear to yellow sputum, but this morning brought black sputum.  No history of  hemoptysis.  She isn't aware of having fever.  She has intermittent wheezing and feels like she can't take a deep breath.  She has to stop and catch her breath when walking at the grocery store.  She has trouble with cough while asleep.  She has been using zyrtec and flonase for allergies.  She just finished a 12 day course of prednisone w/o improvement.  She is completing a course of augmentin.  She was on doxycycline in February 2022.  She was on bactrim in March 2022, but this caused her to get blisters in her mouth.  She has been using combivent and ventolin.  These help, but effects don't last.    Physical Exam:   Appearance - well kempt   ENMT - no sinus tenderness, no oral exudate, no LAN, Mallampati 3 airway, no stridor, raspy voice  Respiratory - equal breath sounds bilaterally, scattered rhonchi that clear with cough, no wheezing or rales  CV - s1s2 regular rate and rhythm, no murmurs  Ext - no clubbing, no edema  Skin - no rashes  Psych - normal mood and affect   Pulmonary testing:    Chest Imaging:   CT chest 08/14/19 >> centrilobular emphysema, scarring RUL, 3 mm nodule RML, 3 mm nodule RLL, mild GGO 1.2 x 0.9 cm RLL, 4 mm nodule LUL, 1.1 x 1.2 GGO LUL, 1.2 x 1 cm nodule LLL  Social History:  She  reports that she quit smoking about 25 years ago. Her smoking use included cigarettes. She has a 45.00 pack-year smoking history. She has never used  smokeless tobacco. She reports current alcohol use of about 4.0 standard drinks of alcohol per week. She reports that she does not use drugs.  Family History:  Her family history includes Cancer in her paternal aunt, paternal grandmother, and sister; Cancer (age of onset: 53) in her sister; Diabetes in her mother; Heart attack in her mother.     Assessment/Plan:   Chronic cough with history of tobacco abuse and possible occupational exposure. - CT chest imaging shows changes of emphysema and lung nodules - will give her trial of  breztri - she is to complete course of augmentin and will give additional course of zithromax - will arrange for CT chest without contrast and pulmonary function test - will arrange for sputum culture, sputum for AFB, and sputum fungal culture - don't think she needs additional course of prednisone at this time  Parkinson disease, Restless leg syndrome. - followed by Dr. Jennings Books with Neurology at [facility]  History of breast cancer and vocal cord cancer. - followed by Dr. Randa Evens with [facility] in [place]  Time Spent Involved in Patient Care on Day of Examination:  47 minutes  Follow up:  Patient Instructions  Will arrange for CT chest and pulmonary function test  Will arrange for sputum culture, sputum for AFB and sputum for fungal culture  Complete course of augmentin  Zithromax 500 mg pill daily for 7 days  Breztri two puffs in the morning and two puffs in the evening, and rinse your mouth after each use  Follow up in 2 weeks with Dr. Halford Chessman or Nurse Practitioner   Medication List:   Allergies as of 08/06/2020   No Known Allergies     Medication List       Accurate as of August 06, 2020 10:24 AM. If you have any questions, ask your nurse or doctor.        STOP taking these medications   albuterol 108 (90 Base) MCG/ACT inhaler Commonly known as: VENTOLIN HFA Stopped by: Chesley Mires, MD   amoxicillin-clavulanate 875-125 MG tablet Commonly known as: AUGMENTIN Stopped by: Chesley Mires, MD   B-12 COMPLIANCE INJECTION IJ Stopped by: Chesley Mires, MD   predniSONE 10 MG tablet Commonly known as: DELTASONE Stopped by: Chesley Mires, MD     TAKE these medications   azithromycin 500 MG tablet Commonly known as: ZITHROMAX Take 1 tablet (500 mg total) by mouth daily. Started by: Chesley Mires, MD   benzonatate 200 MG capsule Commonly known as: TESSALON Take 1 capsule (200 mg total) by mouth 3 (three) times daily as needed.   Breztri  Aerosphere 160-9-4.8 MCG/ACT Aero Generic drug: Budeson-Glycopyrrol-Formoterol Inhale 2 puffs into the lungs in the morning and at bedtime. Started by: Chesley Mires, MD   busPIRone 5 MG tablet Commonly known as: BUSPAR Take 1 tablet (5 mg total) by mouth 2 (two) times daily.   cetirizine 10 MG tablet Commonly known as: ZYRTEC Take 10 mg by mouth daily.   cyclobenzaprine 5 MG tablet Commonly known as: FLEXERIL TAKE 1 TABLET BY MOUTH 3 TIMES DAILY AS NEEDED FOR MUSCLE SPASMS   DULoxetine 60 MG capsule Commonly known as: CYMBALTA Take 1 capsule by mouth daily.   fluticasone 50 MCG/ACT nasal spray Commonly known as: FLONASE Place 1 spray into both nostrils at bedtime.   gabapentin 300 MG capsule Commonly known as: NEURONTIN TAKE 1 CAPSULE BY MOUTH 3 TIMES A DAY   Ipratropium-Albuterol 20-100 MCG/ACT Aers respimat Commonly known as: COMBIVENT  Inhale 1 puff into the lungs every 6 (six) hours.   Lidocaine 4 % Soln Apply 1-2 sprays topically 3 (three) times daily as needed.   omeprazole 40 MG capsule Commonly known as: PRILOSEC TAKE 1 CAPSULE BY MOUTH EVERY DAY (MORNING)   ondansetron 4 MG tablet Commonly known as: Zofran Take 1 tablet (4 mg total) by mouth every 8 (eight) hours as needed for nausea or vomiting.   rOPINIRole 0.25 MG tablet Commonly known as: REQUIP Take 0.25 mg by mouth 3 (three) times daily.       Signature:  Chesley Mires, MD [place] Pager - [phone] 08/06/2020, 10:24 AM

## 2020-08-10 ENCOUNTER — Other Ambulatory Visit: Payer: Self-pay | Admitting: Physician Assistant

## 2020-08-10 DIAGNOSIS — R059 Cough, unspecified: Secondary | ICD-10-CM

## 2020-08-10 NOTE — Telephone Encounter (Signed)
Requested medications are due for refill today.  yes  Requested medications are on the active medications list.  yes  Last refill. 07/24/2020  Future visit scheduled.   no  Notes to clinic.  Rx signed by Marlyn Corporal.

## 2020-08-11 NOTE — Telephone Encounter (Signed)
Please review. Dennis pt.

## 2020-08-12 DIAGNOSIS — D2261 Melanocytic nevi of right upper limb, including shoulder: Secondary | ICD-10-CM | POA: Diagnosis not present

## 2020-08-12 DIAGNOSIS — D0472 Carcinoma in situ of skin of left lower limb, including hip: Secondary | ICD-10-CM | POA: Diagnosis not present

## 2020-08-12 DIAGNOSIS — D225 Melanocytic nevi of trunk: Secondary | ICD-10-CM | POA: Diagnosis not present

## 2020-08-12 DIAGNOSIS — D0461 Carcinoma in situ of skin of right upper limb, including shoulder: Secondary | ICD-10-CM | POA: Diagnosis not present

## 2020-08-12 DIAGNOSIS — X32XXXA Exposure to sunlight, initial encounter: Secondary | ICD-10-CM | POA: Diagnosis not present

## 2020-08-12 DIAGNOSIS — D485 Neoplasm of uncertain behavior of skin: Secondary | ICD-10-CM | POA: Diagnosis not present

## 2020-08-12 DIAGNOSIS — D0471 Carcinoma in situ of skin of right lower limb, including hip: Secondary | ICD-10-CM | POA: Diagnosis not present

## 2020-08-12 DIAGNOSIS — D2262 Melanocytic nevi of left upper limb, including shoulder: Secondary | ICD-10-CM | POA: Diagnosis not present

## 2020-08-12 DIAGNOSIS — L570 Actinic keratosis: Secondary | ICD-10-CM | POA: Diagnosis not present

## 2020-08-12 DIAGNOSIS — Z85828 Personal history of other malignant neoplasm of skin: Secondary | ICD-10-CM | POA: Diagnosis not present

## 2020-08-12 DIAGNOSIS — D0439 Carcinoma in situ of skin of other parts of face: Secondary | ICD-10-CM | POA: Diagnosis not present

## 2020-08-12 DIAGNOSIS — D2272 Melanocytic nevi of left lower limb, including hip: Secondary | ICD-10-CM | POA: Diagnosis not present

## 2020-08-17 ENCOUNTER — Other Ambulatory Visit: Payer: Self-pay | Admitting: Family Medicine

## 2020-08-17 NOTE — Telephone Encounter (Signed)
Requested medication (s) are due for refill today: yes  Requested medication (s) are on the active medication list: yes  Last refill:  07/03/20  Future visit scheduled: no  Notes to clinic:  med not delegated to NT to RF   Requested Prescriptions  Pending Prescriptions Disp Refills   cyclobenzaprine (FLEXERIL) 5 MG tablet [Pharmacy Med Name: CYCLOBENZAPRINE HCL 5 MG TAB] 30 tablet 1    Sig: TAKE 1 TABLET BY MOUTH 3 TIMES DAILY AS NEEDED FOR MUSCLE SPASMS      Not Delegated - Analgesics:  Muscle Relaxants Failed - 08/17/2020 12:50 PM      Failed - This refill cannot be delegated      Passed - Valid encounter within last 6 months    Recent Outpatient Visits           3 weeks ago Upper respiratory tract infection, unspecified type   [facility] [place], Clearnce Sorrel, [place]   1 month ago [place], [place], [place]   2 months ago Cough   [place], [place]   4 months ago COPD exacerbation [facility])   [place], Vickki Muff, PA-C   5 months ago [place], PA-C       Future Appointments             In 1 week [place], Fonnie Mu, NP [facility]

## 2020-08-20 ENCOUNTER — Inpatient Hospital Stay: Payer: Medicare HMO | Attending: Oncology

## 2020-08-20 DIAGNOSIS — I2510 Atherosclerotic heart disease of native coronary artery without angina pectoris: Secondary | ICD-10-CM | POA: Diagnosis not present

## 2020-08-20 DIAGNOSIS — R918 Other nonspecific abnormal finding of lung field: Secondary | ICD-10-CM

## 2020-08-20 DIAGNOSIS — Z853 Personal history of malignant neoplasm of breast: Secondary | ICD-10-CM | POA: Diagnosis not present

## 2020-08-20 DIAGNOSIS — Z8521 Personal history of malignant neoplasm of larynx: Secondary | ICD-10-CM | POA: Insufficient documentation

## 2020-08-20 DIAGNOSIS — Z9221 Personal history of antineoplastic chemotherapy: Secondary | ICD-10-CM | POA: Insufficient documentation

## 2020-08-20 DIAGNOSIS — Z08 Encounter for follow-up examination after completed treatment for malignant neoplasm: Secondary | ICD-10-CM

## 2020-08-20 DIAGNOSIS — Z8589 Personal history of malignant neoplasm of other organs and systems: Secondary | ICD-10-CM | POA: Insufficient documentation

## 2020-08-20 DIAGNOSIS — Z85828 Personal history of other malignant neoplasm of skin: Secondary | ICD-10-CM | POA: Insufficient documentation

## 2020-08-20 DIAGNOSIS — Z87891 Personal history of nicotine dependence: Secondary | ICD-10-CM | POA: Insufficient documentation

## 2020-08-20 DIAGNOSIS — E538 Deficiency of other specified B group vitamins: Secondary | ICD-10-CM | POA: Insufficient documentation

## 2020-08-20 DIAGNOSIS — Z79899 Other long term (current) drug therapy: Secondary | ICD-10-CM | POA: Insufficient documentation

## 2020-08-20 DIAGNOSIS — Z923 Personal history of irradiation: Secondary | ICD-10-CM | POA: Diagnosis not present

## 2020-08-20 DIAGNOSIS — K219 Gastro-esophageal reflux disease without esophagitis: Secondary | ICD-10-CM | POA: Insufficient documentation

## 2020-08-20 DIAGNOSIS — J432 Centrilobular emphysema: Secondary | ICD-10-CM | POA: Insufficient documentation

## 2020-08-20 LAB — COMPREHENSIVE METABOLIC PANEL WITH GFR
ALT: 18 U/L (ref 0–44)
AST: 24 U/L (ref 15–41)
Albumin: 3.7 g/dL (ref 3.5–5.0)
Alkaline Phosphatase: 59 U/L (ref 38–126)
Anion gap: 8 (ref 5–15)
BUN: 12 mg/dL (ref 8–23)
CO2: 27 mmol/L (ref 22–32)
Calcium: 8.8 mg/dL — ABNORMAL LOW (ref 8.9–10.3)
Chloride: 105 mmol/L (ref 98–111)
Creatinine, Ser: 0.8 mg/dL (ref 0.44–1.00)
GFR, Estimated: >60 mL/min
Glucose, Bld: 144 mg/dL — ABNORMAL HIGH (ref 70–99)
Potassium: 4.3 mmol/L (ref 3.5–5.1)
Sodium: 140 mmol/L (ref 135–145)
Total Bilirubin: 0.4 mg/dL (ref 0.3–1.2)
Total Protein: 6.4 g/dL — ABNORMAL LOW (ref 6.5–8.1)

## 2020-08-20 LAB — CBC WITH DIFFERENTIAL/PLATELET
Abs Immature Granulocytes: 0.03 10*3/uL (ref 0.00–0.07)
Basophils Absolute: 0 10*3/uL (ref 0.0–0.1)
Basophils Relative: 1 %
Eosinophils Absolute: 0.3 10*3/uL (ref 0.0–0.5)
Eosinophils Relative: 5 %
HCT: 39.6 % (ref 36.0–46.0)
Hemoglobin: 13.1 g/dL (ref 12.0–15.0)
Immature Granulocytes: 1 %
Lymphocytes Relative: 18 %
Lymphs Abs: 1.1 10*3/uL (ref 0.7–4.0)
MCH: 33 pg (ref 26.0–34.0)
MCHC: 33.1 g/dL (ref 30.0–36.0)
MCV: 99.7 fL (ref 80.0–100.0)
Monocytes Absolute: 0.4 10*3/uL (ref 0.1–1.0)
Monocytes Relative: 7 %
Neutro Abs: 3.9 10*3/uL (ref 1.7–7.7)
Neutrophils Relative %: 68 %
Platelets: 212 10*3/uL (ref 150–400)
RBC: 3.97 MIL/uL (ref 3.87–5.11)
RDW: 12.6 % (ref 11.5–15.5)
WBC: 5.7 10*3/uL (ref 4.0–10.5)
nRBC: 0 % (ref 0.0–0.2)

## 2020-08-20 LAB — VITAMIN B12: Vitamin B-12: 364 pg/mL (ref 180–914)

## 2020-08-21 ENCOUNTER — Ambulatory Visit
Admission: RE | Admit: 2020-08-21 | Discharge: 2020-08-21 | Disposition: A | Discharge status: Home / Self Care | Payer: Medicare HMO | Source: Ambulatory Visit | Attending: Oncology | Admitting: Oncology

## 2020-08-21 ENCOUNTER — Telehealth: Payer: Self-pay

## 2020-08-21 ENCOUNTER — Other Ambulatory Visit: Payer: Self-pay

## 2020-08-21 DIAGNOSIS — N289 Disorder of kidney and ureter, unspecified: Secondary | ICD-10-CM | POA: Insufficient documentation

## 2020-08-21 DIAGNOSIS — I2510 Atherosclerotic heart disease of native coronary artery without angina pectoris: Secondary | ICD-10-CM | POA: Insufficient documentation

## 2020-08-21 DIAGNOSIS — R590 Localized enlarged lymph nodes: Secondary | ICD-10-CM | POA: Diagnosis not present

## 2020-08-21 DIAGNOSIS — I700 Atherosclerosis of aorta: Secondary | ICD-10-CM | POA: Diagnosis not present

## 2020-08-21 DIAGNOSIS — Z08 Encounter for follow-up examination after completed treatment for malignant neoplasm: Secondary | ICD-10-CM | POA: Diagnosis not present

## 2020-08-21 DIAGNOSIS — R1031 Right lower quadrant pain: Secondary | ICD-10-CM | POA: Diagnosis not present

## 2020-08-21 DIAGNOSIS — Z853 Personal history of malignant neoplasm of breast: Secondary | ICD-10-CM | POA: Diagnosis not present

## 2020-08-21 DIAGNOSIS — Z85819 Personal history of malignant neoplasm of unspecified site of lip, oral cavity, and pharynx: Secondary | ICD-10-CM | POA: Diagnosis not present

## 2020-08-21 DIAGNOSIS — J439 Emphysema, unspecified: Secondary | ICD-10-CM | POA: Diagnosis not present

## 2020-08-21 DIAGNOSIS — N2889 Other specified disorders of kidney and ureter: Secondary | ICD-10-CM | POA: Diagnosis not present

## 2020-08-21 DIAGNOSIS — C50911 Malignant neoplasm of unspecified site of right female breast: Secondary | ICD-10-CM | POA: Diagnosis not present

## 2020-08-21 DIAGNOSIS — R918 Other nonspecific abnormal finding of lung field: Secondary | ICD-10-CM

## 2020-08-21 DIAGNOSIS — K449 Diaphragmatic hernia without obstruction or gangrene: Secondary | ICD-10-CM | POA: Diagnosis not present

## 2020-08-21 MED ORDER — IOHEXOL 300 MG/ML  SOLN
100.0000 mL | Freq: Once | INTRAMUSCULAR | Status: AC | PRN
  Administered 2020-08-21: 100 mL via INTRAVENOUS

## 2020-08-21 NOTE — Telephone Encounter (Signed)
ATC pt on several occassions regarding her Covid test and was not successful. Will be closing this encounter due to clinic  Protocol.

## 2020-08-21 NOTE — Telephone Encounter (Signed)
ATC pt on 4/22 regarding her Covid test on 4/22.

## 2020-08-21 NOTE — Telephone Encounter (Signed)
ATC patient several times regarding her Covid test on Monday 4/25.

## 2020-08-24 ENCOUNTER — Other Ambulatory Visit
Admission: RE | Admit: 2020-08-24 | Discharge: 2020-08-24 | Disposition: A | Discharge status: Home / Self Care | Payer: Medicare HMO | Source: Ambulatory Visit | Attending: Pulmonary Disease | Admitting: Pulmonary Disease

## 2020-08-24 ENCOUNTER — Other Ambulatory Visit: Payer: Self-pay

## 2020-08-24 ENCOUNTER — Other Ambulatory Visit: Payer: Medicare HMO

## 2020-08-24 DIAGNOSIS — Z20822 Contact with and (suspected) exposure to covid-19: Secondary | ICD-10-CM | POA: Diagnosis not present

## 2020-08-24 DIAGNOSIS — Z01812 Encounter for preprocedural laboratory examination: Secondary | ICD-10-CM | POA: Insufficient documentation

## 2020-08-24 LAB — SARS CORONAVIRUS 2 (TAT 6-24 HRS): SARS Coronavirus 2: NEGATIVE

## 2020-08-25 ENCOUNTER — Other Ambulatory Visit: Payer: Self-pay | Admitting: Family Medicine

## 2020-08-25 ENCOUNTER — Ambulatory Visit: Payer: Medicare HMO | Attending: Pulmonary Disease

## 2020-08-25 ENCOUNTER — Other Ambulatory Visit: Payer: Self-pay

## 2020-08-25 DIAGNOSIS — R053 Chronic cough: Secondary | ICD-10-CM | POA: Insufficient documentation

## 2020-08-25 MED ORDER — ALBUTEROL SULFATE (2.5 MG/3ML) 0.083% IN NEBU
2.5000 mg | INHALATION_SOLUTION | Freq: Once | RESPIRATORY_TRACT | Status: AC
  Administered 2020-08-25: 2.5 mg via RESPIRATORY_TRACT
  Filled 2020-08-25: qty 3

## 2020-08-25 NOTE — Telephone Encounter (Signed)
Requested Prescriptions  Pending Prescriptions Disp Refills  . gabapentin (NEURONTIN) 300 MG capsule [Pharmacy Med Name: GABAPENTIN 300 MG CAP] 60 capsule 1    Sig: TAKE 1 CAPSULE BY MOUTH 3 TIMES A DAY     Neurology: Anticonvulsants - gabapentin Passed - 08/25/2020 12:26 PM      Passed - Valid encounter within last 12 months    Recent Outpatient Visits          1 month ago Upper respiratory tract infection, unspecified type   [place], Clearnce Sorrel, [place]   2 months ago [place], [place], [place]   2 months ago Cough   [place], [place]   4 months ago COPD exacerbation [facility])   [place], Vickki Muff, PA-C   5 months ago [place], PA-C      Future Appointments            In 3 days [place], Fonnie Mu, NP [facility]

## 2020-08-27 DIAGNOSIS — G894 Chronic pain syndrome: Secondary | ICD-10-CM | POA: Diagnosis not present

## 2020-08-27 DIAGNOSIS — G20 Parkinson's disease: Secondary | ICD-10-CM | POA: Diagnosis not present

## 2020-08-27 DIAGNOSIS — M7582 Other shoulder lesions, left shoulder: Secondary | ICD-10-CM | POA: Diagnosis not present

## 2020-08-27 NOTE — Progress Notes (Signed)
Attempted to call patient about scheduling office visit with NP and/or Dr Halford Chessman. Was unable to reach patient due to voicemail box being full. Patient does already have an appointment scheduled with Rexene Edison NP tomorrow, 08/28/20 at the [place] office at 3:30pm. Can go over PFT and CT results with patient then?

## 2020-08-28 ENCOUNTER — Other Ambulatory Visit: Payer: Self-pay

## 2020-08-28 ENCOUNTER — Encounter: Payer: Self-pay | Admitting: Adult Health

## 2020-08-28 ENCOUNTER — Inpatient Hospital Stay (HOSPITAL_BASED_OUTPATIENT_CLINIC_OR_DEPARTMENT_OTHER): Payer: Medicare HMO | Admitting: Oncology

## 2020-08-28 ENCOUNTER — Ambulatory Visit: Payer: Medicare HMO | Admitting: Adult Health

## 2020-08-28 VITALS — BP 120/70 | HR 87 | Ht 61.0 in | Wt 143.0 lb

## 2020-08-28 VITALS — BP 122/68 | HR 103 | Temp 98.4°F | Resp 18 | Wt 143.7 lb

## 2020-08-28 DIAGNOSIS — C4490 Unspecified malignant neoplasm of skin, unspecified: Secondary | ICD-10-CM

## 2020-08-28 DIAGNOSIS — Z8589 Personal history of malignant neoplasm of other organs and systems: Secondary | ICD-10-CM | POA: Diagnosis not present

## 2020-08-28 DIAGNOSIS — Z171 Estrogen receptor negative status [ER-]: Secondary | ICD-10-CM | POA: Diagnosis not present

## 2020-08-28 DIAGNOSIS — Z923 Personal history of irradiation: Secondary | ICD-10-CM | POA: Diagnosis not present

## 2020-08-28 DIAGNOSIS — C7491 Malignant neoplasm of unspecified part of right adrenal gland: Secondary | ICD-10-CM

## 2020-08-28 DIAGNOSIS — Z85828 Personal history of other malignant neoplasm of skin: Secondary | ICD-10-CM | POA: Diagnosis not present

## 2020-08-28 DIAGNOSIS — C50511 Malignant neoplasm of lower-outer quadrant of right female breast: Secondary | ICD-10-CM

## 2020-08-28 DIAGNOSIS — E538 Deficiency of other specified B group vitamins: Secondary | ICD-10-CM

## 2020-08-28 DIAGNOSIS — R053 Chronic cough: Secondary | ICD-10-CM | POA: Diagnosis not present

## 2020-08-28 DIAGNOSIS — J432 Centrilobular emphysema: Secondary | ICD-10-CM | POA: Diagnosis not present

## 2020-08-28 DIAGNOSIS — Z8521 Personal history of malignant neoplasm of larynx: Secondary | ICD-10-CM | POA: Diagnosis not present

## 2020-08-28 DIAGNOSIS — I700 Atherosclerosis of aorta: Secondary | ICD-10-CM | POA: Diagnosis not present

## 2020-08-28 DIAGNOSIS — Z853 Personal history of malignant neoplasm of breast: Secondary | ICD-10-CM | POA: Diagnosis not present

## 2020-08-28 DIAGNOSIS — R918 Other nonspecific abnormal finding of lung field: Secondary | ICD-10-CM

## 2020-08-28 DIAGNOSIS — R1310 Dysphagia, unspecified: Secondary | ICD-10-CM | POA: Diagnosis not present

## 2020-08-28 DIAGNOSIS — Z9221 Personal history of antineoplastic chemotherapy: Secondary | ICD-10-CM | POA: Diagnosis not present

## 2020-08-28 NOTE — Progress Notes (Signed)
@Patient  ID: Denise Watts, female    DOB: 09/11/1955, 66 y.o.   MRN: [id number]  Chief Complaint  Patient presents with  . 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   08/28/2020 Follow up  : Chronic cough , Bronchitis  Patient returns for a 2-week follow-up.  Patient was seen last visit for a pulmonary consult for cough x 5 months.  She has received several courses of antibiotics without significant improvement, this included doxycycline, Bactrim and Augmentin along with a prednisone taper. Last visit was recommended to begin a Z-Pak.  She was set up for pulmonary function testing yesterday that showed showed small obstructive airways disease with hyperinflation and air trapping.  FEV1 was 55%, ratio 77, FVC 54%, no significant bronchodilator response DLCO 35%.  Patient has significant cough throughout the test.  CT chest 4/22  shows no evidence of progressive metastatic disease within the chest, left axillary node is not pathologic in size but minimally enlarged from previous exam.  Stable bilateral pulmonary nodules.  Moderate emphysema Since last visit patient is feeling about the same with only minimal decrease in cough . Cough is minimally improved, feels like she has mucus but can not cough up anything.  She remains on BREZTRI Twice daily  . Chronic dysphagia  since head/neck radiation due to vocal cord cancer.  No hemoptysis , n/v/d, chest pain or orthopnea.    No Known Allergies  Immunization History  Administered Date(s) Administered  . Fluad Quad(high Dose 65+) 01/31/2019, 02/20/2020  . Influenza,inj,Quad PF,6+ Mos 03/02/2017, 06/08/2018  . PFIZER(Purple Top)SARS-COV-2 Vaccination 07/05/2019, 07/26/2019, 03/31/2020    Past Medical History:  Diagnosis Date  . Anxiety   . Arthritis    KNEE RIGHT  . Breast cancer [facility]) 1997   right breast lumpectomy with radiation  . Breast cancer ([place]) 11/15/2017   INVASIVE MAMMARY CARCINOMA/ triple negative  . 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[place]) 11/10/2014  . Cough 03/02/2017   INTERMTTENT DRY COUGH  . Depression   . GERD (gastroesophageal reflux disease)   . History of hiatal hernia   . Personal history of chemotherapy   . Personal history of radiation therapy   . Skin cancer   . Throat cancer ([employer]) 1998   radiation    Tobacco History: Social History   Tobacco Use  Smoking Status Former Smoker  . Packs/day: 1.50  . Years: 30.00  . Pack years: 45.00  . Types: Cigarettes  . Quit date: 05/03/1995  . Years since quitting: 25.3  Smokeless Tobacco Never Used   Counseling given: Not Answered   Outpatient Medications Prior to Visit  Medication Sig Dispense Refill  . benzonatate (TESSALON) 200 MG capsule TAKE 1 CAPSULE BY MOUTH 3 TIMES DAILY ASNEEDED 30 capsule 0  . Budeson-Glycopyrrol-Formoterol (BREZTRI AEROSPHERE) 160-9-4.8 MCG/ACT AERO Inhale 2 puffs into the lungs in the  morning and at bedtime. 5.9 g 5  . busPIRone (BUSPAR) 5 MG tablet Take 1 tablet (5 mg total) by mouth 2 (two) times daily. 60 tablet 0  . cyclobenzaprine (FLEXERIL) 5 MG tablet TAKE 1 TABLET BY MOUTH 3 TIMES DAILY AS NEEDED FOR MUSCLE SPASMS 30 tablet 1  . DULoxetine (CYMBALTA) 60 MG capsule Take by mouth.    . fluticasone (FLONASE) 50 MCG/ACT nasal spray Place 1 spray into both nostrils at bedtime. 16 g 6  . gabapentin  (NEURONTIN) 300 MG capsule TAKE 1 CAPSULE BY MOUTH 3 TIMES A DAY 60 capsule 1  . Lidocaine 4 % SOLN Apply 1-2 sprays topically 3 (three) times daily as needed. 100 mL 0  . omeprazole (PRILOSEC) 40 MG capsule TAKE 1 CAPSULE BY MOUTH EVERY DAY (MORNING) 90 capsule 4  . ondansetron (ZOFRAN) 4 MG tablet Take 1 tablet (4 mg total) by mouth every 8 (eight) hours as needed for nausea or vomiting. 20 tablet 0  . rOPINIRole (REQUIP) 0.25 MG tablet Take 0.25 mg by mouth 3 (three) times daily.     Marland Kitchen azithromycin (ZITHROMAX) 500 MG tablet Take 1 tablet (500 mg total) by mouth daily. 7 tablet 0  . cetirizine (ZYRTEC) 10 MG tablet Take 10 mg by mouth daily. (Patient not taking: Reported on 08/28/2020)    . Ipratropium-Albuterol (COMBIVENT) 20-100 MCG/ACT AERS respimat Inhale 1 puff into the lungs every 6 (six) hours. 4 g 1   Facility-Administered Medications Prior to Visit  Medication Dose Route Frequency Provider Last Rate Last Admin  . cyanocobalamin ((VITAMIN B-12)) injection 1,000 mcg  1,000 mcg Intramuscular Q30 days Sindy Guadeloupe, MD   1,000 mcg at 08/03/18 0936  . cyanocobalamin ((VITAMIN B-12)) injection 1,000 mcg  1,000 mcg Intramuscular Q30 days Sindy Guadeloupe, MD   1,000 mcg at 09/28/18 1013  . cyanocobalamin ((VITAMIN B-12)) injection 1,000 mcg  1,000 mcg Intramuscular Q30 days Sindy Guadeloupe, MD   1,000 mcg at 11/30/18 1355  . cyanocobalamin ((VITAMIN B-12)) injection 1,000 mcg  1,000 mcg Intramuscular Q30 days Sindy Guadeloupe, MD   1,000 mcg at 08/16/19 1144     Review of Systems:   Constitutional:   No  weight loss, night sweats,  Fevers, chills,  +fatigue, or  lassitude.  HEENT:   No headaches,  Difficulty swallowing,  Tooth/dental problems, or  Sore throat,                No sneezing, itching, ear ache, nasal congestion, post nasal drip,   CV:  No chest pain,  Orthopnea, PND, swelling in lower extremities, anasarca, dizziness, palpitations, syncope.   GI  +heartburn, indigestion, no  abdominal pain, nausea, vomiting, diarrhea, change in bowel habits, loss of appetite, bloody stools. + dysphagia   Resp:    No chest wall deformity  Skin: no rash or lesions.  GU: no dysuria, change in color of urine, no urgency or frequency.  No flank pain, no hematuria   MS:  No joint pain or swelling.  No decreased range of motion.  No back pain.    Physical Exam  BP 120/70   Pulse 87   Ht 5\' 1"  (1.549 m)   Wt 143 lb (64.9 kg)   SpO2 97%   BMI 27.02 kg/m   GEN: A/Ox3; pleasant , NAD, well nourished    HEENT:  [place]/AT,   NOSE-clear, THROAT-clear, no lesions, no postnasal drip or exudate noted. Voice hoarseness   NECK:  Supple w/ fair ROM;  no JVD; normal carotid impulses w/o bruits; no thyromegaly or nodules palpated; no lymphadenopathy.    RESP  Clear  P & A; w/o, wheezes/ rales/ or rhonchi. no accessory muscle use, no dullness to percussion  CARD:  RRR, no m/r/g, no peripheral edema, pulses intact, no cyanosis or clubbing.  GI:   Soft & nt; nml bowel sounds; no organomegaly or masses detected.   Musco: Warm bil, no deformities or joint swelling noted.   Neuro: alert, no focal deficits noted.    Skin: Warm, no lesions or rashes    Lab Results:  CBC    Component Value Date/Time   WBC 5.7 08/20/2020 1343   RBC 3.97 08/20/2020 1343   HGB 13.1 08/20/2020 1343   HGB 13.5 04/08/2014 0409   HCT 39.6 08/20/2020 1343   HCT 40.6 04/08/2014 0409   PLT 212 08/20/2020 1343   PLT 169 04/08/2014 0409   MCV 99.7 08/20/2020 1343   MCV 100 04/08/2014 0409   MCH 33.0 08/20/2020 1343   MCHC 33.1 08/20/2020 1343   RDW 12.6 08/20/2020 1343   RDW 12.7 04/08/2014 0409   LYMPHSABS 1.1 08/20/2020 1343   LYMPHSABS 0.8 (L) 04/08/2014 0409   MONOABS 0.4 08/20/2020 1343   MONOABS 0.7 04/08/2014 0409   EOSABS 0.3 08/20/2020 1343   EOSABS 0.0 04/08/2014 0409   BASOSABS 0.0 08/20/2020 1343   BASOSABS 0.0 04/08/2014 0409    BMET    Component Value Date/Time   NA 140 08/20/2020  1343   NA 140 04/03/2017 1423   NA 143 04/08/2014 0409   K 4.3 08/20/2020 1343   K 3.5 04/08/2014 0409   CL 105 08/20/2020 1343   CL 113 (H) 04/08/2014 0409   CO2 27 08/20/2020 1343   CO2 26 04/08/2014 0409   GLUCOSE 144 (H) 08/20/2020 1343   GLUCOSE 84 04/08/2014 0409   BUN 12 08/20/2020 1343   BUN 11 04/03/2017 1423   BUN 6 (L) 04/08/2014 0409   CREATININE 0.80 08/20/2020 1343   CREATININE 0.67 04/08/2014 0409   CALCIUM 8.8 (L) 08/20/2020 1343   CALCIUM 7.5 (L) 04/08/2014 0409   GFRNONAA >60 08/20/2020 1343   GFRNONAA >60 04/08/2014 0409   GFRNONAA >60 11/26/2012 1019   GFRAA >60 01/20/2020 1314   GFRAA >60 04/08/2014 0409   GFRAA >60 11/26/2012 1019    BNP No results found for: BNP  ProBNP No results found for: PROBNP  Imaging: CT CHEST ABDOMEN PELVIS W CONTRAST  Result Date: 08/21/2020 CLINICAL DATA:  Right-sided breast cancer. Lumpectomy. Chest pains and chronic cough. Ex-smoker. Throat cancer with prior surgery. Right lower quadrant pain for 6 months. Hernia repair and robotic adrenalectomy. EXAM: CT CHEST, ABDOMEN, AND PELVIS WITH CONTRAST TECHNIQUE: Multidetector CT imaging of the chest, abdomen and pelvis was performed following the standard protocol during bolus administration of intravenous contrast. CONTRAST:  191mL OMNIPAQUE IOHEXOL 300 MG/ML  SOLN COMPARISON:  12/30/2019 abdominopelvic CT. Staging CT of 08/14/2019, chest abdomen and pelvis. FINDINGS: CT CHEST FINDINGS Cardiovascular: Aortic atherosclerosis. Normal heart size, without pericardial effusion. Proximal LAD or distal left main coronary artery calcification. No central pulmonary embolism, on this non-dedicated study. Mediastinum/Nodes: No supraclavicular adenopathy. A left axillary node measures 7 mm on 18/2 and is more distinct and 4 mm on the prior. No mediastinal or hilar adenopathy. Fluid level in the esophagus including on 40/2. No internal mammary adenopathy. Lungs/Pleura: No pleural fluid.  Moderate  centrilobular emphysema. Again identified are multiple pulmonary nodules. Left apical 3 mm  solid nodule on 26/3 is unchanged. A left upper lobe mixed attenuation nodule maximally 1.4 cm on 68/3 is felt to be similar. A ground-glass subtle 8 mm left lower lobe nodule on 74/3 is new. Right lower lobe sub solid subcentimeter subpleural nodule including on 84/3 is unchanged. Solid superior segment left lower lobe 1.2 x 0.9 cm solid nodule on 62/3 is similar to 1.2 x 1.0 cm on the prior. Similar soft tissue thickening along the right minor fissure including on 75/3. Subtle, basilar predominant centrilobular ground-glass nodularity is felt to be increased. Example 98/3 in the right lower lobe. Musculoskeletal: Right breast lumpectomy changes with presumed fat necrosis or breast reconstruction. No acute osseous abnormality. CT ABDOMEN PELVIS FINDINGS Hepatobiliary: Normal liver. Normal gallbladder, without biliary ductal dilatation. Pancreas: Normal, without mass or ductal dilatation. Spleen: Normal in size, without focal abnormality. Adrenals/Urinary Tract: Right adrenalectomy. Mild left adrenal thickening is unchanged. Upper pole left renal 1.0 cm lesion measures slightly greater than fluid density, but is similar in size to on the prior exam. Other bilateral too small to characterize renal lesions. Normal urinary bladder. Stomach/Bowel: Tiny hiatal hernia. Colonic stool burden suggests constipation. Normal terminal ileum. Normal small bowel. Vascular/Lymphatic: Advanced aortic and branch vessel atherosclerosis. No abdominopelvic adenopathy. Reproductive: Normal uterus and adnexa. Other: No significant free fluid. Mild pelvic floor laxity. No evidence of omental or peritoneal disease. Musculoskeletal: Transitional lumbosacral anatomy. IMPRESSION: 1. No evidence of progressive metastatic disease within the chest. 2. Left axillary node is not pathologic by size criteria but minimally enlarged compared to the prior. This  could be reactive, especially if the patient has received interval COVID-19 vaccine. Recommend attention on follow-up. 3. Primarily similar bilateral pulmonary nodules. 1 ground-glass nodule is new. 4. Aortic atherosclerosis (ICD10-I70.0), coronary artery atherosclerosis and emphysema (ICD10-J43.9). 5.  Possible constipation. 6. Upper pole left renal lesion is likely a minimally complex cyst. Recommend attention on follow-up. 7. Subtle centrilobular ground-glass within both lower lobes, suspicious for atypical infection in this patient with a history of cough. 8. Esophageal air fluid level suggests dysmotility or gastroesophageal reflux. Electronically Signed   By: Abigail Miyamoto M.D.   On: 08/21/2020 14:54   Pulmonary Function Test [facility] Only  Result Date: 08/27/2020 Spirometry Data Is Acceptable and Reproducible Findings suggest small obstructive airways disease with hyperinflation and air trapping Clinical Correlation Advised    albuterol (PROVENTIL) (2.5 MG/3ML) 0.083% nebulizer solution 2.5 mg    Date Action Dose Route User   08/25/2020 1557 Given 2.5 mg Nebulization Dwaine Deter H, RT      No flowsheet data found.  No results found for: NITRICOXIDE      Assessment & Plan:   No problem-specific Assessment & Plan notes found for this encounter.     Rexene Edison, NP 08/28/2020

## 2020-08-28 NOTE — Patient Instructions (Addendum)
Begin Robitussin DM 1-2 tsp every 4hr as needed cough  Use Tessalon Three times a day  For cough As needed   Continue on BREZTRI 2 puffs Twice daily  , rinse after use.  Aspiration precautions .  Refer to GI for Dysphagia .  Will discuss CT chest with Dr. Halford Chessman  And get back to you on Monday .  Follow up with Dr. Halford Chessman  In 6-8 weeks and As needed

## 2020-08-30 NOTE — Progress Notes (Signed)
Hematology/Oncology Consult note [facility]  Telephone:([phone] [phone]  Patient Care Team: Chrismon, Driscilla Grammes as PCP - General (Physician Assistant) Sindy Guadeloupe, MD as Consulting Physician (Oncology) Jules Husbands, MD as Consulting Physician (General Surgery) Dasher, Rayvon Char, MD (Dermatology) Scheeler, Carola Rhine, PA-C as Physician Assistant (Plastic Surgery) Harvest Dark, MD as Attending Physician (Emergency Medicine) Gayland Curry Hubbard Hartshorn (Neurology) Marlowe Sax, MD as Referring Physician (Internal Medicine)   Name of the patient: Denise Watts  [id number]  [birth year]   Date of visit: 08/30/20  Diagnosis- 1.H/o breast cancer in 1997 2. H/o stage I SCC of vocal cord in 2015. 3. Lung nodules4. Adrenal carcinoma of unknown primary s/p resection5. Stage IaT1a NxcM0 ER weakly positive 1-10%, PR negative and her 2 negative  Chief complaint/ Reason for visit-routine follow-up of breast cancer and lung nodules  Heme/Onc history:  Oncology History Overview Note  1. Carcinoma of breast, T1, N0, M0 tumor diagnosis.  In January of 1997 2. Carcinoma of the vocal cord T1, N0, M0 tumor.  Status postradiation therapy 3. Abnormal CT scan of the chest (December, 2015) 4. Repeat CT scan of chest shows stable nodule (July, 2016) 5. Neutropenia with normal hemoglobin and normal platelet count (July, 2016)   Cancer of vocal cord [facility])  11/10/2014 Initial Diagnosis   Cancer of vocal cord   Malignant neoplasm of lower-outer quadrant of right breast of female, estrogen receptor negative ([place])  11/24/2017 Initial Diagnosis   Malignant neoplasm of lower-outer quadrant of right breast of female, estrogen receptor negative ([place])   01/12/2018 Cancer Staging   Staging form: Breast, AJCC 8th Edition - Clinical stage from 01/12/2018: Stage IB (cT1a, cN0, cM0, G3, ER+, PR-, HER2-) - Signed by Sindy Guadeloupe, MD on 01/12/2018   01/30/2018  - 04/26/2018 Chemotherapy   The patient had dexamethasone (DECADRON) 4 MG tablet, 8 mg, Oral, 2 times daily, 1 of 1 cycle, Start date: 01/12/2018, End date: 08/03/2018 palonosetron (ALOXI) injection 0.25 mg, 0.25 mg, Intravenous,  Once, 4 of 4 cycles Administration: 0.25 mg (01/30/2018), 0.25 mg (02/27/2018), 0.25 mg (03/28/2018), 0.25 mg (04/26/2018) pegfilgrastim (NEULASTA ONPRO KIT) injection 6 mg, 6 mg, Subcutaneous, Once, 4 of 4 cycles Administration: 6 mg (01/30/2018), 6 mg (02/27/2018), 6 mg (03/28/2018), 6 mg (04/26/2018) cyclophosphamide (CYTOXAN) 1,000 mg in sodium chloride 0.9 % 250 mL chemo infusion, 600 mg/m2 = 1,000 mg, Intravenous,  Once, 4 of 4 cycles Administration: 1,000 mg (01/30/2018), 1,000 mg (02/27/2018), 1,000 mg (03/28/2018), 1,000 mg (04/26/2018) DOCEtaxel (TAXOTERE) 130 mg in sodium chloride 0.9 % 250 mL chemo infusion, 75 mg/m2 = 130 mg, Intravenous,  Once, 4 of 4 cycles Dose modification: 60 mg/m2 (original dose 75 mg/m2, Cycle 2, Reason: Dose not tolerated) Administration: 130 mg (01/30/2018), 100 mg (02/27/2018), 100 mg (03/28/2018), 100 mg (04/26/2018)  for chemotherapy treatment.      The patient was originally diagnosed with right breast cancer in 1997,10 mm grade 3, ER pr positive her 2 negative, node negativestatus post surgery and radiation treatment only. She never received chemotherapy or hormone therapy. For her vocal cord cancer, she had extensive surgery and radiation therapy in 1998. Due to past history of smoking, she was also noted to have lung nodules. She is undergoing active surveillance imaging study of the chest.  Patient noted to have adrenal mass on CT chest which prompted PET CT in July 2018 which showed:IMPRESSION: 1. Right adrenal mass with peripheral hypermetabolism. Given interval development since 11/18/2015,  suspicious for either isolated metastasis or a primary adrenal neoplasm. This should be considered for biopsy. 2. No evidence of  hypermetabolic primary malignancy or extraadrenal metastasis. Pulmonary nodules are not significantly hypermetabolic. 3. Coronary artery atherosclerosis. Aortic Atherosclerosis (ICD10-I70.0). 4. Hepatic steatosis. 5. Left sixth rib hypermetabolism is favored to be related to remote Trauma.  Patient underwent right adrenalectomy which showed:DIAGNOSIS:  A. ADRENAL GLAND, RIGHT; ADRENALECTOMY:  - HIGH GRADE CARCINOMA WITH EXTENSIVE NECROSIS.  - MARGINS OF THE SPECIMEN ARE NEGATIVE FOR CARCINOMA.   Comment:  Sections demonstrate abundant necrosis confined to the adrenal medullary  compartment. In one block of tissue a small fragment of viable malignant  neoplasm was identified. A panel of immunohistochemical stains was  performed with the following pattern of immunoreactivity:  Super pancytokeratin: Positive  GATA-3: Negative  TTF-1: Negative  P40: Negative  Napsin: Negative  PAX-8: Negative  Chromogranin: Negative   Repeat CT chest abdomen and pelvis in July 2019 showed stable left lower lobe pulmonary nodule. No other evidence of metastatic disease. New 4.5 mm nodule in the right breast. This was followed by ultrasound mammogram and core biopsy of the breast lesion which was a grade 3 triple negative breast cancer. Patient was seen by Dr. Bary Castilla and underwent lumpectomy on 12/08/2017.  Patient had a 5 mm weakly ER positive tumor. Given her ongoing fatigue Oncotype testing was done to see if she could potentially avoid chemotherapy. Oncotype shows a recurrence score of 53 with her risk of distant recurrence at 9 years at greater than 39% with hormone therapyalone.Absolute benefit of chemotherapy was greater than 15% in her age group.Patient completed 4 cycles of adjuvant TC chemotherapy in December 2019  Interval history-patient reports bilateral chest wall pain ongoing cough and shortness of breath which been ongoing for the last 2 to 3 months.  She is also following up  with pulmonary for this and was given antibiotics in the past.  ECOG PS- 2 Pain scale- 4   Review of systems- Review of Systems  Constitutional: Positive for malaise/fatigue. Negative for chills, fever and weight loss.  HENT: Negative for congestion, ear discharge and nosebleeds.   Eyes: Negative for blurred vision.  Respiratory: Positive for cough and shortness of breath. Negative for hemoptysis, sputum production and wheezing.   Cardiovascular: Negative for chest pain, palpitations, orthopnea and claudication.  Gastrointestinal: Negative for abdominal pain, blood in stool, constipation, diarrhea, heartburn, melena, nausea and vomiting.  Genitourinary: Negative for dysuria, flank pain, frequency, hematuria and urgency.  Musculoskeletal: Negative for back pain, joint pain and myalgias.  Skin: Negative for rash.  Neurological: Negative for dizziness, tingling, focal weakness, seizures, weakness and headaches.  Endo/Heme/Allergies: Does not bruise/bleed easily.  Psychiatric/Behavioral: Negative for depression and suicidal ideas. The patient does not have insomnia.       No Known Allergies   Past Medical History:  Diagnosis Date  . Anxiety   . Arthritis    KNEE RIGHT  . Breast cancer [facility]) 1997   right breast lumpectomy with radiation  . Breast cancer ([place]) 11/15/2017   INVASIVE MAMMARY CARCINOMA/ triple negative  . 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[place]) 11/10/2014  . Cough 03/02/2017   INTERMTTENT DRY COUGH  . Depression   . GERD (gastroesophageal reflux disease)   . History of hiatal hernia   . Personal history of chemotherapy   . Personal history of radiation therapy   . Skin cancer   . Throat cancer [facility]) 1998   radiation     Past Surgical History:  Procedure Laterality Date  . BREAST BIOPSY Right 1997   positive  . BREAST BIOPSY Right 11/15/2017   INVASIVE MAMMARY CARCINOMA triple negative  . BREAST BIOPSY Right 07/20/2020   [place] bx, Q marker, path pending  . BREAST  LUMPECTOMY Right 1997   2019 also  . BREAST LUMPECTOMY WITH SENTINEL LYMPH NODE BIOPSY Right 12/08/2017   Procedure: BREAST LUMPECTOMY WITH SENTINEL LYMPH NODE BX;  Surgeon: Robert Bellow, MD;  Location: [facility] ORS;  Service: General;  Laterality: Right;  . BREAST REDUCTION WITH MASTOPEXY Left 06/10/2019   Procedure: Left breast mastopexy reduction for symmetry;  Surgeon: Wallace Going, DO;  Location: [place];  Service: Plastics;  Laterality: Left;  total 2.5 hours  . COLONOSCOPY  2015  . COLONOSCOPY WITH PROPOFOL N/A 10/29/2019   Procedure: COLONOSCOPY WITH PROPOFOL;  Surgeon: Lucilla Lame, MD;  Location: [facility] ENDOSCOPY;  Service: Endoscopy;  Laterality: N/A;  . ESOPHAGOGASTRODUODENOSCOPY (EGD) WITH PROPOFOL N/A 05/24/2019   Procedure: ESOPHAGOGASTRODUODENOSCOPY (EGD) WITH PROPOFOL;  Surgeon: Lucilla Lame, MD;  Location: [facility] ENDOSCOPY;  Service: Endoscopy;  Laterality: N/A;  . ESOPHAGUS SURGERY    . KNEE SURGERY Right   . LIPOSUCTION WITH LIPOFILLING Right 06/10/2019   Procedure: right breast scar and capsule release with fat grafting;  Surgeon: Wallace Going, DO;  Location: [place];  Service: Plastics;  Laterality: Right;  . PORT-A-CATH REMOVAL    . PORTACATH PLACEMENT Right 01/17/2018   Procedure: INSERTION PORT-A-CATH- RIGHT;  Surgeon: Robert Bellow, MD;  Location: [facility] ORS;  Service: General;  Laterality: Right;  . ROBOTIC ADRENALECTOMY Right 12/28/2016   Procedure: ROBOTIC ADRENALECTOMY;  Surgeon: Hollice Espy, MD;  Location: [facility] ORS;  Service: Urology;  Laterality: Right;  . SKIN CANCER EXCISION    . THROAT SURGERY  1998   throat cancer   . TUBAL LIGATION    . VENTRAL HERNIA REPAIR N/A 03/03/2017   Procedure: HERNIA REPAIR VENTRAL ADULT;  Surgeon: Clayburn Pert, MD;  Location: [facility] ORS;  Service: General;  Laterality: N/A;    Social History   Socioeconomic History  . Marital status: Married    Spouse name: Not on file  .  Number of children: 4  . Years of education: Not on file  . Highest education level: 10th grade  Occupational History  . Occupation: retired  Tobacco Use  . Smoking status: Former Smoker    Packs/day: 1.50    Years: 30.00    Pack years: 45.00    Types: Cigarettes    Quit date: 05/03/1995    Years since quitting: 25.3  . Smokeless tobacco: Never Used  Vaping Use  . Vaping Use: Never used  Substance and Sexual Activity  . Alcohol use: Yes    Alcohol/week: 4.0 standard drinks    Types: 4 Shots of liquor per week    Comment: 2 per night on weekends  . Drug use: No  . Sexual activity: Not on file  Other Topics Concern  . Not on file  Social History Narrative  . Not on file   Social Determinants of Health   Financial Resource Strain: Low Risk   . Difficulty of Paying Living Expenses: Not hard at all  Food Insecurity: No Food Insecurity  . Worried About Charity fundraiser in the Last Year: Never true  . Ran Out of Food in the Last Year: Never true  Transportation Needs: No Transportation Needs  . Lack of Transportation (Medical): No  . Lack of  Transportation (Non-Medical): No  Physical Activity: Insufficiently Active  . Days of Exercise per Week: 3 days  . Minutes of Exercise per Session: 30 min  Stress: Stress Concern Present  . Feeling of Stress : Rather much  Social Connections: Moderately Isolated  . Frequency of Communication with Friends and Family: More than three times a week  . Frequency of Social Gatherings with Friends and Family: More than three times a week  . Attends Religious Services: Never  . Active Member of Clubs or Organizations: No  . Attends Archivist Meetings: Never  . Marital Status: Married  Human resources officer Violence: Not At Risk  . Fear of Current or Ex-Partner: No  . Emotionally Abused: No  . Physically Abused: No  . Sexually Abused: No    Family History  Problem Relation Age of Onset  . Diabetes Mother   . Heart attack Mother    . Cancer Sister        cancer of eyes, spread to lung, other places. died at 61  . Cancer Sister 77       unknown primary, spread to bone, brain died in 96's  . Cancer Paternal Aunt        unk type  . Cancer Paternal Grandmother        type unk  . Prostate cancer Neg Hx   . Kidney cancer Neg Hx   . Bladder Cancer Neg Hx      Current Outpatient Medications:  .  benzonatate (TESSALON) 200 MG capsule, TAKE 1 CAPSULE BY MOUTH 3 TIMES DAILY ASNEEDED, Disp: 30 capsule, Rfl: 0 .  Budeson-Glycopyrrol-Formoterol (BREZTRI AEROSPHERE) 160-9-4.8 MCG/ACT AERO, Inhale 2 puffs into the lungs in the morning and at bedtime., Disp: 5.9 g, Rfl: 5 .  busPIRone (BUSPAR) 5 MG tablet, Take 1 tablet (5 mg total) by mouth 2 (two) times daily., Disp: 60 tablet, Rfl: 0 .  cyclobenzaprine (FLEXERIL) 5 MG tablet, TAKE 1 TABLET BY MOUTH 3 TIMES DAILY AS NEEDED FOR MUSCLE SPASMS, Disp: 30 tablet, Rfl: 1 .  DULoxetine (CYMBALTA) 60 MG capsule, Take by mouth., Disp: , Rfl:  .  fluticasone (FLONASE) 50 MCG/ACT nasal spray, Place 1 spray into both nostrils at bedtime., Disp: 16 g, Rfl: 6 .  gabapentin (NEURONTIN) 300 MG capsule, TAKE 1 CAPSULE BY MOUTH 3 TIMES A DAY, Disp: 60 capsule, Rfl: 1 .  Lidocaine 4 % SOLN, Apply 1-2 sprays topically 3 (three) times daily as needed., Disp: 100 mL, Rfl: 0 .  omeprazole (PRILOSEC) 40 MG capsule, TAKE 1 CAPSULE BY MOUTH EVERY DAY (MORNING), Disp: 90 capsule, Rfl: 4 .  ondansetron (ZOFRAN) 4 MG tablet, Take 1 tablet (4 mg total) by mouth every 8 (eight) hours as needed for nausea or vomiting., Disp: 20 tablet, Rfl: 0 .  rOPINIRole (REQUIP) 0.25 MG tablet, Take 0.25 mg by mouth 3 (three) times daily. , Disp: , Rfl:  No current facility-administered medications for this visit.  Facility-Administered Medications Ordered in Other Visits:  .  cyanocobalamin ((VITAMIN B-12)) injection 1,000 mcg, 1,000 mcg, Intramuscular, Q30 days, Sindy Guadeloupe, MD, 1,000 mcg at 08/03/18 0936 .   cyanocobalamin ((VITAMIN B-12)) injection 1,000 mcg, 1,000 mcg, Intramuscular, Q30 days, Sindy Guadeloupe, MD, 1,000 mcg at 09/28/18 1013 .  cyanocobalamin ((VITAMIN B-12)) injection 1,000 mcg, 1,000 mcg, Intramuscular, Q30 days, Sindy Guadeloupe, MD, 1,000 mcg at 11/30/18 1355 .  cyanocobalamin ((VITAMIN B-12)) injection 1,000 mcg, 1,000 mcg, Intramuscular, Q30 days, Sindy Guadeloupe, MD, 1,000 mcg at  08/16/19 1144  Physical exam:  Vitals:   08/28/20 1318  BP: 122/68  Pulse: (!) 103  Resp: 18  Temp: 98.4 F (36.9 C)  SpO2: 97%  Weight: 143 lb 11.2 oz (65.2 kg)   Physical Exam Constitutional:      General: She is not in acute distress. Cardiovascular:     Rate and Rhythm: Normal rate and regular rhythm.     Heart sounds: Normal heart sounds.  Pulmonary:     Effort: Pulmonary effort is normal.     Breath sounds: Normal breath sounds.  Abdominal:     General: Bowel sounds are normal.     Palpations: Abdomen is soft.  Skin:    General: Skin is warm and dry.  Neurological:     Mental Status: She is alert and oriented to person, place, and time.    Breast exam was performed in seated and lying down position. Patient is status post right lumpectomy with a well-healed surgical scar. No evidence of any palpable masses. No evidence of axillary adenopathy. No evidence of any palpable masses or lumps in the left breast. No evidence of leftt axillary adenopathy    CMP Latest Ref Rng & Units 08/20/2020  Glucose 70 - 99 mg/dL 144(H)  BUN 8 - 23 mg/dL 12  Creatinine 0.44 - 1.00 mg/dL 0.80  Sodium 135 - 145 mmol/L 140  Potassium 3.5 - 5.1 mmol/L 4.3  Chloride 98 - 111 mmol/L 105  CO2 22 - 32 mmol/L 27  Calcium 8.9 - 10.3 mg/dL 8.8(L)  Total Protein 6.5 - 8.1 g/dL 6.4(L)  Total Bilirubin 0.3 - 1.2 mg/dL 0.4  Alkaline Phos 38 - 126 U/L 59  AST 15 - 41 U/L 24  ALT 0 - 44 U/L 18   CBC Latest Ref Rng & Units 08/20/2020  WBC 4.0 - 10.5 K/uL 5.7  Hemoglobin 12.0 - 15.0 g/dL 13.1  Hematocrit  36.0 - 46.0 % 39.6  Platelets 150 - 400 K/uL 212    No images are attached to the encounter.  CT CHEST ABDOMEN PELVIS W CONTRAST  Result Date: 08/21/2020 CLINICAL DATA:  Right-sided breast cancer. Lumpectomy. Chest pains and chronic cough. Ex-smoker. Throat cancer with prior surgery. Right lower quadrant pain for 6 months. Hernia repair and robotic adrenalectomy. EXAM: CT CHEST, ABDOMEN, AND PELVIS WITH CONTRAST TECHNIQUE: Multidetector CT imaging of the chest, abdomen and pelvis was performed following the standard protocol during bolus administration of intravenous contrast. CONTRAST:  153m OMNIPAQUE IOHEXOL 300 MG/ML  SOLN COMPARISON:  12/30/2019 abdominopelvic CT. Staging CT of 08/14/2019, chest abdomen and pelvis. FINDINGS: CT CHEST FINDINGS Cardiovascular: Aortic atherosclerosis. Normal heart size, without pericardial effusion. Proximal LAD or distal left main coronary artery calcification. No central pulmonary embolism, on this non-dedicated study. Mediastinum/Nodes: No supraclavicular adenopathy. A left axillary node measures 7 mm on 18/2 and is more distinct and 4 mm on the prior. No mediastinal or hilar adenopathy. Fluid level in the esophagus including on 40/2. No internal mammary adenopathy. Lungs/Pleura: No pleural fluid.  Moderate centrilobular emphysema. Again identified are multiple pulmonary nodules. Left apical 3 mm solid nodule on 26/3 is unchanged. A left upper lobe mixed attenuation nodule maximally 1.4 cm on 68/3 is felt to be similar. A ground-glass subtle 8 mm left lower lobe nodule on 74/3 is new. Right lower lobe sub solid subcentimeter subpleural nodule including on 84/3 is unchanged. Solid superior segment left lower lobe 1.2 x 0.9 cm solid nodule on 62/3 is similar to 1.2 x 1.0  cm on the prior. Similar soft tissue thickening along the right minor fissure including on 75/3. Subtle, basilar predominant centrilobular ground-glass nodularity is felt to be increased. Example 98/3 in  the right lower lobe. Musculoskeletal: Right breast lumpectomy changes with presumed fat necrosis or breast reconstruction. No acute osseous abnormality. CT ABDOMEN PELVIS FINDINGS Hepatobiliary: Normal liver. Normal gallbladder, without biliary ductal dilatation. Pancreas: Normal, without mass or ductal dilatation. Spleen: Normal in size, without focal abnormality. Adrenals/Urinary Tract: Right adrenalectomy. Mild left adrenal thickening is unchanged. Upper pole left renal 1.0 cm lesion measures slightly greater than fluid density, but is similar in size to on the prior exam. Other bilateral too small to characterize renal lesions. Normal urinary bladder. Stomach/Bowel: Tiny hiatal hernia. Colonic stool burden suggests constipation. Normal terminal ileum. Normal small bowel. Vascular/Lymphatic: Advanced aortic and branch vessel atherosclerosis. No abdominopelvic adenopathy. Reproductive: Normal uterus and adnexa. Other: No significant free fluid. Mild pelvic floor laxity. No evidence of omental or peritoneal disease. Musculoskeletal: Transitional lumbosacral anatomy. IMPRESSION: 1. No evidence of progressive metastatic disease within the chest. 2. Left axillary node is not pathologic by size criteria but minimally enlarged compared to the prior. This could be reactive, especially if the patient has received interval COVID-19 vaccine. Recommend attention on follow-up. 3. Primarily similar bilateral pulmonary nodules. 1 ground-glass nodule is new. 4. Aortic atherosclerosis (ICD10-I70.0), coronary artery atherosclerosis and emphysema (ICD10-J43.9). 5.  Possible constipation. 6. Upper pole left renal lesion is likely a minimally complex cyst. Recommend attention on follow-up. 7. Subtle centrilobular ground-glass within both lower lobes, suspicious for atypical infection in this patient with a history of cough. 8. Esophageal air fluid level suggests dysmotility or gastroesophageal reflux. Electronically Signed   By:  Abigail Miyamoto M.D.   On: 08/21/2020 14:54   Pulmonary Function Test [facility] Only  Result Date: 08/27/2020 Spirometry Data Is Acceptable and Reproducible Findings suggest small obstructive airways disease with hyperinflation and air trapping Clinical Correlation Advised     Assessment and plan- Patient is a 65 y.o. 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 Final path showed 5 mm grade 3 Er weakly positive 1-10%, pr negative and her 2 negative tumor.Oncotype came back as high risk.She completed 4 cycles of adjuvant TC chemotherapy in December 2019.  She is here for routine follow-up of breast cancer and to discuss CT scan results and further management  1.  Right breast cancer stage I s/p surgery and 4 cycles of adjuvant TC chemotherapy.  Clinically patient is doing well with no concerning signs and symptoms of recurrence based on today's exam.  Mammograms to be coordinated by surgery.  2. Bilateral lung nodules: She has undergone repeat CT chest abdomen and pelvis on 08/21/2020 which did not show any evidence of recurrent or progressive malignancy.  She has bilateral pulmonary nodules which have not significantly changed.   3.  Exertional shortness of breath: Patient follows up with pulmonary for this as well.  She was noted to have proximal LAD and distal left main coronary artery calcification.  I will refer her to cardiology for these findings and to determine if her shortness of breath is related to cardiac cause.  I will see her in 6 months with labs   Visit Diagnosis 1.  Malignant neoplasm of lower-outer quadrant of right breast of female, estrogen receptor negative ([place])   2. Skin cancer   3. B12 deficiency   4. Adrenal cancer,  right ([place])   5. Aortic atherosclerosis ([place])      Dr. Randa Evens, MD, MPH [facility] [phone] 08/30/2020 2:42 PM

## 2020-08-31 ENCOUNTER — Telehealth: Payer: Self-pay | Admitting: Adult Health

## 2020-08-31 ENCOUNTER — Other Ambulatory Visit: Payer: Self-pay | Admitting: Family Medicine

## 2020-08-31 DIAGNOSIS — R059 Cough, unspecified: Secondary | ICD-10-CM

## 2020-08-31 DIAGNOSIS — R053 Chronic cough: Secondary | ICD-10-CM | POA: Insufficient documentation

## 2020-08-31 DIAGNOSIS — R918 Other nonspecific abnormal finding of lung field: Secondary | ICD-10-CM

## 2020-08-31 NOTE — Telephone Encounter (Signed)
Tried to call patient to discuss recent CT chest.  No answer and voicemail was full.  Please contact patient and let her know that I reviewed her CT scan results.  Lung nodules appear stable.  For now we will continue to follow this closely have her follow-up with GI as recommended at the office visit.  We will repeat the CT chest in 6 months.  Please set up a CT chest without contrast in 6 months.  Please make sure that she has a follow-up visit with Dr. Halford Chessman in 6 to 8 weeks and as needed Please contact office for sooner follow up if symptoms do not improve or worsen or seek emergency care

## 2020-08-31 NOTE — Assessment & Plan Note (Signed)
Chronic cough for the last 5 to 6 months.  Patient has been treated with multiple courses of antibiotics and steroids without significant improvement.  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 Pulmonary function testing shows small airways disease and restrictive lung disease. We will continue on triple inhaler maintenance therapy.  Begin cough control regimen with Robitussin-DM liquid form as patient has trouble swallowing pills.  We will also use Tessalon 3 times daily.  Hold on any additional steroids and antibiotics at this time.  Patient is unable to cough up any mucus for sputum culture. Will refer to GI for chronic dysphagia  Plan  Patient Instructions  Begin Robitussin DM 1-2 tsp every 4hr as needed cough  Use Tessalon Three times a day  For cough As needed   Continue on BREZTRI 2 puffs Twice daily  , rinse after use.  Aspiration precautions .  Refer to GI for Dysphagia .  Will discuss CT chest with Dr. Halford Chessman  And get back to you on Monday .  Follow up with Dr. Halford Chessman  In 6-8 weeks and As needed

## 2020-08-31 NOTE — Assessment & Plan Note (Addendum)
Multiple pulmonary nodules are stable on CT with largest measuring 1.4 cm in the left upper lobe.  And increased basilar groundglass nodularity slightly increased. Patient has been treated with multiple antibiotics.  Hold on additional antibiotics at this time.  Unable to provide a sputum culture at this time. We will have her follow-up with GI for this chronic dysphagia. We will repeat CT chest without contrast in 6 months. Case was discussed with Dr. Halford Chessman and CT was reviewed in detail

## 2020-08-31 NOTE — Telephone Encounter (Signed)
   Notes to clinic:  Review for continued use and refill   Requested Prescriptions  Pending Prescriptions Disp Refills   benzonatate (TESSALON) 200 MG capsule [Pharmacy Med Name: BENZONATATE 200 MG CAP] 30 capsule 0    Sig: TAKE 1 CAPSULE BY MOUTH 3 TIMES DAILY ASNEEDED      Ear, Nose, and Throat:  Antitussives/Expectorants Passed - 08/31/2020  1:38 PM      Passed - Valid encounter within last 12 months    Recent Outpatient Visits           1 month ago Upper respiratory tract infection, unspecified type   [facility] [place], Clearnce Sorrel, [place]   2 months ago [place], [place]   2 months ago Cough   [place], [place]   4 months ago COPD exacerbation [employer])   [place], Vickki Muff, PA-C   6 months ago [place], PA-C       Future Appointments             In 1 month Chesley Mires, MD [facility]

## 2020-08-31 NOTE — Assessment & Plan Note (Signed)
Chronic dysphagia-patient had head and neck radiation from vocal cord carcinoma She is at risk for aspiration.  Aspiration precautions were discussed.  Will refer to GI for further evaluation Continue on Prilosec daily  Plan  Patient Instructions  Begin Robitussin DM 1-2 tsp every 4hr as needed cough  Use Tessalon Three times a day  For cough As needed   Continue on BREZTRI 2 puffs Twice daily  , rinse after use.  Aspiration precautions .  Refer to GI for Dysphagia .  Will discuss CT chest with Dr. Halford Chessman  And get back to you on Monday .  Follow up with Dr. Halford Chessman  In 6-8 weeks and As needed

## 2020-09-01 NOTE — Telephone Encounter (Signed)
ATC, no answer, could not leave VM due to VM being full. I went ahead and put in order for Ct of chest in Sept 2022. Patient also has a appt with Dr. Halford Chessman on 10/24/2020.

## 2020-09-02 ENCOUNTER — Encounter: Payer: Self-pay | Admitting: *Deleted

## 2020-09-02 NOTE — Telephone Encounter (Signed)
Called the pt again and still no answer and vm is still full. Will mail her a letter to call about the results.

## 2020-09-07 ENCOUNTER — Telehealth: Payer: Self-pay | Admitting: Pulmonary Disease

## 2020-09-07 DIAGNOSIS — R918 Other nonspecific abnormal finding of lung field: Secondary | ICD-10-CM

## 2020-09-07 MED ORDER — PREDNISONE 10 MG PO TABS: ORAL_TABLET | ORAL | 0 refills | Status: DC

## 2020-09-07 NOTE — Telephone Encounter (Signed)
Lm for patient.  

## 2020-09-07 NOTE — Telephone Encounter (Signed)
Patient is aware of below recommendations and voiced her understanding.  Rx for Prednisone has been sent to preferred pharmacy.  Nothing further needed.

## 2020-09-07 NOTE — Telephone Encounter (Signed)
Lm x2 for patient.   

## 2020-09-07 NOTE — Telephone Encounter (Signed)
Can send script for prednisone 10 mg >> 3 pills daily for 2 days, 2 pills daily for 2 days, 1 pill daily for 2 days.

## 2020-09-07 NOTE — Telephone Encounter (Signed)
Parrett, Fonnie Mu, NP    5:45 PM Note Tried to call patient to discuss recent CT chest.  No answer and voicemail was full.  Please contact patient and let her know that I reviewed her CT scan results.  Lung nodules appear stable.  For now we will continue to follow this closely have her follow-up with GI as recommended at the office visit.  We will repeat the CT chest in 6 months.  Please set up a CT chest without contrast in 6 months.  Please make sure that she has a follow-up visit with Dr. Halford Chessman in 6 to 8 weeks and as needed Please contact office for sooner follow up if symptoms do not improve or worsen or seek emergency care      Patient is aware of results and voiced her understanding.  CT has been ordered.  Patient reports of persistent cough, wheezing and sob. Cough is occ prod with yellow sputum. Sx have been present for 66mo. She has completed 5 courses of abx with no improvement in sx. Last dose of abx was one week ago. Denied fever, chills or sweats.  She is using albuterol once daily, Breztri BID, tessalon TID and OTC tussin once daily with no relief.  Fully vaccinated against covid and flu.   Tammy, please advise. Thanks.

## 2020-09-07 NOTE — Telephone Encounter (Signed)
Will send to Dr. Halford Chessman  For further recs.  Seen for consult 4/9  Ov 4/29, She is no better after multiple abx and cough control regimen . I referred to GI and started on cough meds along with aspiration precautions.

## 2020-09-08 ENCOUNTER — Other Ambulatory Visit: Payer: Self-pay

## 2020-09-08 ENCOUNTER — Encounter: Payer: Self-pay | Admitting: Plastic Surgery

## 2020-09-08 ENCOUNTER — Ambulatory Visit (INDEPENDENT_AMBULATORY_CARE_PROVIDER_SITE_OTHER): Payer: Medicare HMO | Admitting: Plastic Surgery

## 2020-09-08 DIAGNOSIS — Z853 Personal history of malignant neoplasm of breast: Secondary | ICD-10-CM

## 2020-09-08 DIAGNOSIS — Z171 Estrogen receptor negative status [ER-]: Secondary | ICD-10-CM

## 2020-09-08 DIAGNOSIS — C50511 Malignant neoplasm of lower-outer quadrant of right female breast: Secondary | ICD-10-CM | POA: Diagnosis not present

## 2020-09-08 NOTE — Progress Notes (Signed)
   Subjective:    Patient ID: Denise Watts, female    DOB: [birth year], 65 y.o.   MRN: [id number]  Is a 65 year old female here for follow-up on her right breast.  She had right breast cancer and underwent a partial mastectomy followed by radiation.  She had cysts extreme asymmetry and contracture of the right breast.  We have been working on this for the past year.  She has improved a great deal with release and fat grafting.  She complains of still having some pain that makes it difficult to touch her right breast.  There is no sign of infection.  There is still some firmness from the radiation but markedly improved from her previous exam.  Recent imaging was negative.     Review of Systems  Constitutional: Negative.   HENT: Negative.   Eyes: Negative.   Respiratory: Negative.   Cardiovascular: Negative.   Gastrointestinal: Negative.   Endocrine: Negative.   Genitourinary: Negative.   Musculoskeletal: Negative.        Objective:   Physical Exam Vitals and nursing note reviewed.  Constitutional:      Appearance: Normal appearance.  HENT:     Head: Normocephalic and atraumatic.  Cardiovascular:     Rate and Rhythm: Normal rate.     Pulses: Normal pulses.  Pulmonary:     Effort: Pulmonary effort is normal.  Abdominal:     General: Abdomen is flat.  Skin:    Capillary Refill: Capillary refill takes less than 2 seconds.  Neurological:     General: No focal deficit present.     Mental Status: She is alert and oriented to person, place, and time.  Psychiatric:        Mood and Affect: Mood normal.        Behavior: Behavior normal.        Thought Content: Thought content normal.        Assessment & Plan:     ICD-10-CM   1. Malignant neoplasm of lower-outer quadrant of right breast of female, estrogen receptor negative ([place])  C50.511    Z17.1   2. History of breast cancer  Z85.3     I appreciate the fact that she has pain but do not think that surgery would improve the  breast tenderness.  I willing to try but it could make her worse.  I think before we do any surgical intervention we should try physical therapy.  The patient is in agreement.  I like to see her back in 1 year for follow-up.  If she has any questions or concerns I can certainly see her back sooner.  Pictures were obtained of the patient and placed in the chart with the patient's or guardian's permission.

## 2020-09-16 ENCOUNTER — Ambulatory Visit: Payer: Medicare HMO | Admitting: Physical Therapy

## 2020-09-21 ENCOUNTER — Other Ambulatory Visit: Payer: Self-pay | Admitting: Family Medicine

## 2020-09-21 DIAGNOSIS — R059 Cough, unspecified: Secondary | ICD-10-CM

## 2020-09-21 NOTE — Telephone Encounter (Signed)
Requested Prescriptions  Pending Prescriptions Disp Refills  . benzonatate (TESSALON) 200 MG capsule [Pharmacy Med Name: BENZONATATE 200 MG CAP] 30 capsule 0    Sig: TAKE 1 CAPSULE BY MOUTH 3 TIMES DAILY ASNEEDED     Ear, Nose, and Throat:  Antitussives/Expectorants Passed - 09/21/2020  5:49 PM      Passed - Valid encounter within last 12 months    Recent Outpatient Visits          1 month ago Upper respiratory tract infection, unspecified type   [place], Clearnce Sorrel, [place]   3 months ago [place], [place], [place]   3 months ago Cough   [place], [place]   5 months ago COPD exacerbation [facility])   [place], Vickki Muff, PA-C   6 months ago [place], PA-C      Future Appointments            In 4 weeks [place], Aaron Edelman, MD [facility], [place]   In 1 month Chesley Mires, MD [facility]

## 2020-09-22 ENCOUNTER — Ambulatory Visit: Payer: Medicare HMO | Admitting: Physical Therapy

## 2020-09-22 ENCOUNTER — Ambulatory Visit: Payer: Medicare HMO | Attending: Plastic Surgery

## 2020-09-22 ENCOUNTER — Other Ambulatory Visit: Payer: Self-pay

## 2020-09-22 DIAGNOSIS — M6281 Muscle weakness (generalized): Secondary | ICD-10-CM | POA: Diagnosis not present

## 2020-09-22 DIAGNOSIS — M546 Pain in thoracic spine: Secondary | ICD-10-CM | POA: Insufficient documentation

## 2020-09-22 DIAGNOSIS — R293 Abnormal posture: Secondary | ICD-10-CM | POA: Diagnosis not present

## 2020-09-22 NOTE — Therapy (Signed)
[place] [facility] [address], [place], [address] Phone: [phone]   Fax:  [phone]  Physical Therapy Evaluation  Patient Details  Name: Denise Watts MRN: [id number] Date of Birth: Jun 05, 1955 Referring Provider (PT): Sharmaine Base, DO   Encounter Date: 09/22/2020   PT End of Session - 09/22/20 1149    Visit Number 1    Number of Visits 17    Date for PT Re-Evaluation 10/20/20    Authorization Type [employer] Medicare HMO (VL TBD; $20 copay)    Authorization Time Period 09/22/20-11/17/20    PT Start Time 0905    PT Stop Time 0945    PT Time Calculation (min) 40 min    Activity Tolerance Patient tolerated treatment well;Patient limited by pain    Behavior During Therapy [facility] for tasks assessed/performed           Past Medical History:  Diagnosis Date  . Anxiety   . Arthritis    KNEE RIGHT  . Breast cancer [facility]) 1997   right breast lumpectomy with radiation  . Breast cancer ([place]) 11/15/2017   INVASIVE MAMMARY CARCINOMA/ triple negative  . 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[place]) 11/10/2014  . Cough 03/02/2017   INTERMTTENT DRY COUGH  . Depression   . GERD (gastroesophageal reflux disease)   . History of hiatal hernia   . Personal history of chemotherapy   . Personal history of radiation therapy   . Skin cancer   . Throat cancer [facility]) 1998   radiation    Past Surgical History:  Procedure Laterality Date  . BREAST BIOPSY Right 1997   positive  . BREAST BIOPSY Right 11/15/2017   INVASIVE MAMMARY CARCINOMA triple negative  . BREAST BIOPSY Right 07/20/2020   [place] bx, Q marker, path pending  . BREAST LUMPECTOMY Right 1997   2019 also  . BREAST LUMPECTOMY WITH SENTINEL LYMPH NODE BIOPSY Right 12/08/2017   Procedure: BREAST LUMPECTOMY WITH SENTINEL LYMPH NODE BX;  Surgeon: Robert Bellow, MD;  Location: [facility] ORS;  Service: General;  Laterality: Right;  . BREAST REDUCTION WITH MASTOPEXY Left 06/10/2019    Procedure: Left breast mastopexy reduction for symmetry;  Surgeon: Wallace Going, DO;  Location: [place];  Service: Plastics;  Laterality: Left;  total 2.5 hours  . COLONOSCOPY  2015  . COLONOSCOPY WITH PROPOFOL N/A 10/29/2019   Procedure: COLONOSCOPY WITH PROPOFOL;  Surgeon: Lucilla Lame, MD;  Location: [facility] ENDOSCOPY;  Service: Endoscopy;  Laterality: N/A;  . ESOPHAGOGASTRODUODENOSCOPY (EGD) WITH PROPOFOL N/A 05/24/2019   Procedure: ESOPHAGOGASTRODUODENOSCOPY (EGD) WITH PROPOFOL;  Surgeon: Lucilla Lame, MD;  Location: [facility] ENDOSCOPY;  Service: Endoscopy;  Laterality: N/A;  . ESOPHAGUS SURGERY    . KNEE SURGERY Right   . LIPOSUCTION WITH LIPOFILLING Right 06/10/2019   Procedure: right breast scar and capsule release with fat grafting;  Surgeon: Wallace Going, DO;  Location: [place];  Service: Plastics;  Laterality: Right;  . PORT-A-CATH REMOVAL    . PORTACATH PLACEMENT Right 01/17/2018   Procedure: INSERTION PORT-A-CATH- RIGHT;  Surgeon: Robert Bellow, MD;  Location: [facility] ORS;  Service: General;  Laterality: Right;  . ROBOTIC ADRENALECTOMY Right 12/28/2016   Procedure: ROBOTIC ADRENALECTOMY;  Surgeon: Hollice Espy, MD;  Location: [facility] ORS;  Service: Urology;  Laterality: Right;  . SKIN CANCER EXCISION    . THROAT SURGERY  1998   throat cancer   . TUBAL LIGATION    . VENTRAL HERNIA REPAIR N/A  03/03/2017   Procedure: HERNIA REPAIR VENTRAL ADULT;  Surgeon: Clayburn Pert, MD;  Location: [facility] ORS;  Service: General;  Laterality: N/A;    There were no vitals filed for this visit.    Subjective Assessment - 09/22/20 0912    Subjective Pt presents to OPPT from plastic surgery for anterior thoracic pain s/p breast reconstruction surgery in Feb 2021.    Pertinent History Markiyah Gahm is a 54yoF who is referred to OPPT sports rehab by plastic surgery after continued anterior bilateral thoracic. PMH: COPD, BrCA. Pt reports 2 bouts of BrCA 1997,  2019, both of which involving the right breast, both surgically addressed, both associated with back pain and anterior trunk pain. Pt also reports a fall in 2016 in which she sustained multiple rip fractures.    How long can you sit comfortably? not limited    How long can you stand comfortably? not limited    How long can you walk comfortably? not limited    Currently in Pain? Yes    Pain Score 4     Pain Location --   bilat medial anterior thorax T6 level, burning and sharp             [facility] PT Assessment - 09/22/20 0001      Assessment   Medical Diagnosis Anterior thoracic pain    Referring Provider (PT) Sharmaine Base, DO    Onset Date/Surgical Date 06/03/19    Hand Dominance Right    Prior Therapy none      Precautions   Precautions None      Balance Screen   Has the patient fallen in the past 6 months No    Has the patient had a decrease in activity level because of a fear of falling?  No    Is the patient reluctant to leave their home because of a fear of falling?  No      Home Ecologist residence    Living Arrangements Spouse/significant other    Type of [place] to enter    Entrance Stairs-Number of Steps 5      Prior Function   Level of [place] Retired      Observation/Other Assessments   Focus on Therapeutic Outcomes (FOTO)  deferred, wrong body part      ROM / Strength   AROM / PROM / Strength AROM;Strength      AROM   Overall AROM Comments standing overhead reach screen   WNL shoulder ROM, but guarded trunk movement, eventually aggravated lumbar spine   AROM Assessment Site Thoracic    Thoracic Flexion WNL   No change in symptoms   Thoracic Extension Mildly limited in mid thoracic spine (T6) pain at cervicothoracic jct, pain at thoracolumbar jct   overpressure at T4 and T9 nontender; OP at T6, T12 quite painful   Thoracic - Right Rotation 25% limited, pain at  end-range, most muscle cramps in external obliques   pain at central posterior T6 when OP applied   Thoracic - Left Rotation 25% limited, pain at end-range, most muscle cramps in external obliques   pain at central posterior T6 when OP applied     Strength   Strength Assessment Site Shoulder    Right/Left Shoulder Right;Left    Right Shoulder Flexion 4+/5   supine chest press 90 degrees, no pain, no asymetry, moderate chronic weakness, at baseline   Right Shoulder Extension --  Supine middle trap MMT: 4-/5   Right Shoulder Internal Rotation 5/5   pain free   Right Shoulder External Rotation 4+/5   pain free   Right Shoulder Horizontal ABduction 4/5   tested in 90 degrees abduction, pt in supine   Left Shoulder Flexion 4+/5   supine chest press 90 degrees, no pain, no asymetry, moderate chronic weakness, at baseline   Left Shoulder Extension --   Supine middle trap MMT: 4-/5   Left Shoulder Internal Rotation 5/5   pain free   Left Shoulder External Rotation 5/5   pain free   Left Shoulder Horizontal ABduction 4/5   tested in 90 degrees abduction, pt in supine     Palpation   Palpation comment pain/tenderness to bilat anterior ribs, latera ribs, distal most portion      Special Tests   Other special tests Thoraccic percussion test: compression fracture   positive at T6 level only          INTERVENTION: -seated posterior shoulder circles x20 -seated overhead reach 'in the tube' x15             Objective measurements completed on examination: See above findings.         PT Education - 09/22/20 1148    Education Details Explained exam findings, generalized pain hypersensitivty, hypomoblity, and weakness in the thoracic region; would respond well to improved moblity and strength.    Person(s) Educated Patient    Methods Explanation;Demonstration    Comprehension Verbalized understanding            PT Short Term Goals - 09/22/20 1202      PT SHORT TERM GOAL #1    Title After 4 weeks pt to increase FOTO score by >5 points to suggestion improved pt perception of functional ability.    Time 4    Period Weeks    Status New    Target Date 10/20/20      PT SHORT TERM GOAL #2   Title After 4 weeks pt to demonstrate tolerance of seated thoracic A/ROM flexion, extension, and rotation without exacerbation of pain.    Baseline pain with active extension at eval, pain with OP in rotation and extension at eval.    Time 4    Period Weeks    Status New    Target Date 10/20/20             PT Long Term Goals - 09/22/20 1215      PT LONG TERM GOAL #1   Title After 8 weeks pt to demonstrate 5/5 strength in bilat post deltoid, 4+/5 middle traps.    Time 8    Period Weeks    Status New    Target Date 11/17/20      PT LONG TERM GOAL #2   Title After 8 weeks pt will demonstrate ability to lift and carry 10lb in each hand ~223f without exacerbation of pain to facilitate moving groceries into the house.    Time 8    Period Weeks    Status New    Target Date 11/17/20      PT LONG TERM GOAL #3   Title After 8 weeks pt will reports mild to no discomfort with palpation to the anterior and anterolateral caudal thoracic cage.    Baseline significant tenderness/pain at eval    Time 8    Period Weeks    Status New    Target Date 11/17/20  Plan - 09/22/20 1151    Clinical Impression Statement Kizzie Cotten is a 36yoF who presents with thoracic pain. Pt described pain as anterior mostly central, bilateral, sometimes sharp with burning quality as well. Pt reports contant pain throughout the day irrespective to time, activity, or positioning. Pt endorses worse pain with coughing at times. Examination reveals marked tenderness about the throracic cage (caudal-most margins) with palpation anterior, or anterolateral. Pt has mild to moderate reduced ROM in throracic extension, flexion, and rotation, and pain near T6 level with many. Pt also has  postive thoracic compression test, a screening tool used to identify compression fracture. Pt has mild weakness of BUE with symmetry, she attributes to a chronic baseline phenomenon. Pt has no assymetry with BUE movements, no functional limitations, restrictions or pain with use of the RUE despite history of Rt BrCA and reconstruction x2. Pt will benefit from skilled PT intervetion to address these deficits and limitations in order to improve tolerance to ADL, IADL, and leisure activity.    Personal Factors and Comorbidities Age;Fitness;Education;Time since onset of injury/illness/exacerbation    Examination-Activity Limitations Lift;Transfers;Bed Mobility;Sleep;Carry    Examination-Participation Restrictions Laundry;Yard Work;Shop;Community Activity    Stability/Clinical Decision Making Stable/Uncomplicated    Clinical Decision Making Moderate    Rehab Potential Good    PT Frequency 2x / week    PT Duration 8 weeks    PT Treatment/Interventions ADLs/Self Care Home Management;Electrical Stimulation;Moist Heat;DME Instruction;Gait training;Functional mobility training;Therapeutic activities;Therapeutic exercise;Manual techniques;Dry needling;Patient/family education    PT Next Visit Plan FOTO Review HEP activity, see if tolerated well, begin gentle periscapular strengthening, gentle A/ROM of T-spine    PT Home Exercise Plan Posterior shoulder circles, BUE overhead reach "in the tube"    Consulted and Agree with Plan of Care Patient           Patient will benefit from skilled therapeutic intervention in order to improve the following deficits and impairments:  Cardiopulmonary status limiting activity,Decreased activity tolerance,Decreased mobility,Hypomobility,Decreased range of motion,Increased muscle spasms,Postural dysfunction,Improper body mechanics  Visit Diagnosis: Pain in thoracic spine  Muscle weakness (generalized)  Abnormal posture     Problem List Patient Active Problem List    Diagnosis Date Noted  . Chronic cough 08/31/2020  . Encounter for screening colonoscopy   . Polyp of transverse colon   . Dysphagia   . Stricture and stenosis of esophagus   . Cervical spondylosis 03/18/2019  . 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 Need for prophylactic vaccination and inoculation against influenza 01/25/2019  . Postoperative breast asymmetry 01/04/2019  . Status post partial mastectomy 01/04/2019  . Vitamin D deficiency 12/12/2018  . Bursitis of left shoulder 11/21/2018  . Rotator cuff tendinitis, left 11/21/2018  . Genetic testing 02/01/2018  . Skin cancer 01/18/2018  . Goals of care, counseling/discussion 01/12/2018  . B12 deficiency 12/28/2017  . Malignant neoplasm of lower-outer quadrant of right breast of female, estrogen receptor negative ([place]) 11/24/2017  . Chronic abdominal pain (Primary Area of Pain)  04/03/2017  . Chronic low back pain (Secondary Area of Pain) (B) (R>L) 04/03/2017  . Chronic knee pain [facility] Area of Pain) (R) 04/03/2017  . Chronic pain syndrome 04/03/2017  . Disorder of bone, unspecified 04/03/2017  . Other specified health status 04/03/2017  . Other long term (current) drug therapy 04/03/2017  . Long term current use of opiate analgesic 04/03/2017  . Chronic sacroiliac joint pain 04/03/2017  . S/P repair of ventral hernia 03/03/2017  . Incisional hernia, without obstruction or gangrene 02/13/2017  .  Right adrenal mass ([place]) 12/28/2016  . RIGHT Adrenal Mass 12/16/2016  . Fatigue 02/17/2016  . Other fatigue 02/17/2016  . Multiple lung nodules on CT 02/17/2016  . History of breast cancer 02/17/2016  . Peripheral neuropathy 02/17/2016  . Closed fracture of a rib 02/02/2015  . History of fundoplication 46/56/8127  . History of throat cancer 01/15/2015  . Acid reflux 01/15/2015  . Gonalgia 01/15/2015  . Closed fracture of pubis ([place]) 01/15/2015  . Pelvic fracture ([place]) 01/12/2015  . 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[place]) 11/10/2014  .  Herpes simplex virus (HSV) epithelial keratitis 03/06/2013  . Nuclear sclerotic cataract 03/06/2013  . Onychia of finger 10/23/2008  . Odontogenic tumor 03/13/2008  . Tobacco use 06/14/2007    12:24 PM, 09/22/20 Etta Grandchild, PT, DPT Physical Therapist - [place] [phone] (Office)   Etta Grandchild 09/22/2020, 12:22 PM  [place] [facility] [address], [place], [address] Phone: [phone]   Fax:  [phone]  Name: KATHIE POSA MRN: [id number] Date of Birth: 1955-06-24

## 2020-09-25 ENCOUNTER — Other Ambulatory Visit: Payer: Self-pay | Admitting: Family Medicine

## 2020-09-25 NOTE — Telephone Encounter (Signed)
Requested medications are due for refill today ?? Ordered up to 3 times daily but only given 30, filled 5/6  Requested medications are on the active medication list yes  Last refill 5/6  Last visit 07/24/20  Future visit scheduled no  Notes to clinic Not Delegated.

## 2020-09-29 ENCOUNTER — Encounter: Payer: Self-pay | Admitting: Physical Therapy

## 2020-09-29 ENCOUNTER — Other Ambulatory Visit: Payer: Self-pay

## 2020-09-29 ENCOUNTER — Ambulatory Visit: Payer: Medicare HMO | Admitting: Physical Therapy

## 2020-09-29 DIAGNOSIS — D0439 Carcinoma in situ of skin of other parts of face: Secondary | ICD-10-CM | POA: Diagnosis not present

## 2020-09-29 DIAGNOSIS — R293 Abnormal posture: Secondary | ICD-10-CM

## 2020-09-29 DIAGNOSIS — M6281 Muscle weakness (generalized): Secondary | ICD-10-CM | POA: Diagnosis not present

## 2020-09-29 DIAGNOSIS — M546 Pain in thoracic spine: Secondary | ICD-10-CM

## 2020-09-29 NOTE — Telephone Encounter (Signed)
Patient called in to set up a medication refill appt, patient did get set up for 10/15/2020. Patient requested if she could get a supply to last until appt. Please advise.

## 2020-09-29 NOTE — Therapy (Signed)
[facility] [address], [place], [address] Phone: [phone]   Fax:  [phone]  Physical Therapy Treatment  Patient Details  Name: Denise Watts MRN: [id number] Date of Birth: [birth year] Referring Provider (PT): Sharmaine Base, DO   Encounter Date: 09/29/2020   PT End of Session - 09/29/20 1438    Visit Number 2    Number of Visits 17    Date for PT Re-Evaluation 10/20/20    Authorization Type [employer] Medicare HMO (VL TBD; $20 copay)    Authorization Time Period 09/22/20-11/17/20    PT Start Time 1430    PT Stop Time 1510    PT Time Calculation (min) 40 min    Activity Tolerance Patient tolerated treatment well;Patient limited by pain    Behavior During Therapy [facility] for tasks assessed/performed           Past Medical History:  Diagnosis Date  . Anxiety   . Arthritis    KNEE RIGHT  . Breast cancer [facility]) 1997   right breast lumpectomy with radiation  . Breast cancer ([place]) 11/15/2017   INVASIVE MAMMARY CARCINOMA/ triple negative  . 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 Cough 03/02/2017   INTERMTTENT DRY COUGH  . Depression   . GERD (gastroesophageal reflux disease)   . History of hiatal hernia   . Personal history of chemotherapy   . Personal history of radiation therapy   . Skin cancer   . Throat cancer [facility]) 1998   radiation    Past Surgical History:  Procedure Laterality Date  . BREAST BIOPSY Right 1997   positive  . BREAST BIOPSY Right 11/15/2017   INVASIVE MAMMARY CARCINOMA triple negative  . BREAST BIOPSY Right 07/20/2020   [place] bx, Q marker, path pending  . BREAST LUMPECTOMY Right 1997   2019 also  . BREAST LUMPECTOMY WITH SENTINEL LYMPH NODE BIOPSY Right 12/08/2017   Procedure: BREAST LUMPECTOMY WITH SENTINEL LYMPH NODE BX;  Surgeon: Robert Bellow, MD;  Location: [facility] ORS;  Service: General;  Laterality: Right;  . BREAST REDUCTION WITH MASTOPEXY Left 06/10/2019   Procedure:  Left breast mastopexy reduction for symmetry;  Surgeon: Wallace Going, DO;  Location: [facility];  Service: Plastics;  Laterality: Left;  total 2.5 hours  . COLONOSCOPY  2015  . COLONOSCOPY WITH PROPOFOL N/A 10/29/2019   Procedure: COLONOSCOPY WITH PROPOFOL;  Surgeon: Lucilla Lame, MD;  Location: [facility] ENDOSCOPY;  Service: Endoscopy;  Laterality: N/A;  . ESOPHAGOGASTRODUODENOSCOPY (EGD) WITH PROPOFOL N/A 05/24/2019   Procedure: ESOPHAGOGASTRODUODENOSCOPY (EGD) WITH PROPOFOL;  Surgeon: Lucilla Lame, MD;  Location: [facility] ENDOSCOPY;  Service: Endoscopy;  Laterality: N/A;  . ESOPHAGUS SURGERY    . KNEE SURGERY Right   . LIPOSUCTION WITH LIPOFILLING Right 06/10/2019   Procedure: right breast scar and capsule release with fat grafting;  Surgeon: Wallace Going, DO;  Location: [facility];  Service: Plastics;  Laterality: Right;  . PORT-A-CATH REMOVAL    . PORTACATH PLACEMENT Right 01/17/2018   Procedure: INSERTION PORT-A-CATH- RIGHT;  Surgeon: Robert Bellow, MD;  Location: [facility] ORS;  Service: General;  Laterality: Right;  . ROBOTIC ADRENALECTOMY Right 12/28/2016   Procedure: ROBOTIC ADRENALECTOMY;  Surgeon: Hollice Espy, MD;  Location: [facility] ORS;  Service: Urology;  Laterality: Right;  . SKIN CANCER EXCISION    . THROAT SURGERY  1998   throat cancer   . TUBAL LIGATION    . VENTRAL HERNIA REPAIR N/A  03/03/2017   Procedure: HERNIA REPAIR VENTRAL ADULT;  Surgeon: Clayburn Pert, MD;  Location: [facility] ORS;  Service: General;  Laterality: N/A;    There were no vitals filed for this visit.   Subjective Assessment - 09/29/20 1434    Subjective Since last visit patient had a fall stepping off her steps at home and reports she "hurts all over" and fell on her L side. She reports continued upper back and rib pain 5/10. Ran out of her medication for muscle spasms, and has to see MD prior to getting this refill and reports this is making her pain worse. Reports  compliance with HEP, reporting some pain reduction.    Pertinent History Denise Watts is a 28yoF who is referred to OPPT sports rehab by plastic surgery after continued anterior bilateral thoracic. PMH: COPD, BrCA. Pt reports 2 bouts of BrCA 1997, 2019, both of which involving the right breast, both surgically addressed, both associated with back pain and anterior trunk pain. Pt also reports a fall in 2016 in which she sustained multiple rip fractures.    How long can you sit comfortably? not limited    How long can you stand comfortably? not limited    How long can you walk comfortably? not limited               Ther-Ex Nustep seat seating 7 UE 9 L2 16mns for gentle scapular protraction/retraction and strengthening SPM >40 Attempted thoracic ext in hooklying over towel roll with patient able to complete a few reps but has ant rib pain from fall preventing success with therex Seated theraball fwd rolls x12 with cuing for breath control with good carry over Prone T x6 with max TC initially to prevent shoulder hiking/scapular elevation with decent carry over Scapular retraction against wall x15 max cuing initially for technique without scapular elevation with good carry over Standing row with scapular retraction YTB x12 with focus on scapular motion with good carry over Doorway chest stretch 2x 30sec Shoulder circles x12   Manual Attempted manual techniques to mid trap/rhomboid group, patient unable to tolerate minimal pressure d/t pain from fall                 PT Education - 09/29/20 1438    Education Details therex form/technique    Person(s) Educated Patient    Methods Explanation;Demonstration;Verbal cues;Tactile cues    Comprehension Verbalized understanding;Returned demonstration;Verbal cues required            PT Short Term Goals - 09/22/20 1202      PT SHORT TERM GOAL #1   Title After 4 weeks pt to increase FOTO score by >5 points to suggestion improved pt  perception of functional ability.    Time 4    Period Weeks    Status New    Target Date 10/20/20      PT SHORT TERM GOAL #2   Title After 4 weeks pt to demonstrate tolerance of seated thoracic A/ROM flexion, extension, and rotation without exacerbation of pain.    Baseline pain with active extension at eval, pain with OP in rotation and extension at eval.    Time 4    Period Weeks    Status New    Target Date 10/20/20             PT Long Term Goals - 09/22/20 1215      PT LONG TERM GOAL #1   Title After 8 weeks pt to demonstrate 5/5 strength in  bilat post deltoid, 4+/5 middle traps.    Time 8    Period Weeks    Status New    Target Date 11/17/20      PT LONG TERM GOAL #2   Title After 8 weeks pt will demonstrate ability to lift and carry 10lb in each hand ~255f without exacerbation of pain to facilitate moving groceries into the house.    Time 8    Period Weeks    Status New    Target Date 11/17/20      PT LONG TERM GOAL #3   Title After 8 weeks pt will reports mild to no discomfort with palpation to the anterior and anterolateral caudal thoracic cage.    Baseline significant tenderness/pain at eval    Time 8    Period Weeks    Status New    Target Date 11/17/20                 Plan - 09/29/20 1504    Clinical Impression Statement Session limited by increased pain from patient d/t a fall 4 days ago. Patinet reports she stepped onto her last stair, which was concrete with a crack in it, her foot slipped in the crack and she fell on her L side. Denies LOC or hitting her head. PT encouraged patine tot see MD should pain persist. PT utilized minimal manual techniques d/t increased hypersensitivity to palpation. Patient is able to comply with all cuing for proper technique of therex with good motivation throughout session. PT encouraged postural carry over with good understanding. PT will continue progression as able.    Personal Factors and Comorbidities  Age;Fitness;Education;Time since onset of injury/illness/exacerbation    Examination-Activity Limitations Lift;Transfers;Bed Mobility;Sleep;Carry    Examination-Participation Restrictions Laundry;Yard Work;Shop;Community Activity    Stability/Clinical Decision Making Evolving/Moderate complexity    Clinical Decision Making Moderate    Rehab Potential Good    PT Frequency 2x / week    PT Duration 8 weeks    PT Treatment/Interventions ADLs/Self Care Home Management;Electrical Stimulation;Moist Heat;DME Instruction;Gait training;Functional mobility training;Therapeutic activities;Therapeutic exercise;Manual techniques;Dry needling;Patient/family education    PT Next Visit Plan Review HEP activity, see if tolerated well, begin gentle periscapular strengthening, gentle A/ROM of T-spine    PT Home Exercise Plan Posterior shoulder circles, BUE overhead reach "in the tube"    Consulted and Agree with Plan of Care Patient           Patient will benefit from skilled therapeutic intervention in order to improve the following deficits and impairments:  Cardiopulmonary status limiting activity,Decreased activity tolerance,Decreased mobility,Hypomobility,Decreased range of motion,Increased muscle spasms,Postural dysfunction,Improper body mechanics  Visit Diagnosis: Pain in thoracic spine  Muscle weakness (generalized)  Abnormal posture     Problem List Patient Active Problem List   Diagnosis Date Noted  . Chronic cough 08/31/2020  . Encounter for screening colonoscopy   . Polyp of transverse colon   . Dysphagia   . Stricture and stenosis of esophagus   . Cervical spondylosis 03/18/2019  . 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 Need for prophylactic vaccination and inoculation against influenza 01/25/2019  . Postoperative breast asymmetry 01/04/2019  . Status post partial mastectomy 01/04/2019  . Vitamin D deficiency 12/12/2018  . Bursitis of left shoulder 11/21/2018  . Rotator cuff  tendinitis, left 11/21/2018  . Genetic testing 02/01/2018  . Skin cancer 01/18/2018  . Goals of care, counseling/discussion 01/12/2018  . B12 deficiency 12/28/2017  . Malignant neoplasm of lower-outer quadrant of right breast of female, estrogen  receptor negative ([place]) 11/24/2017  . Chronic abdominal pain (Primary Area of Pain)  04/03/2017  . Chronic low back pain (Secondary Area of Pain) (B) (R>L) 04/03/2017  . Chronic knee pain [facility] Area of Pain) (R) 04/03/2017  . Chronic pain syndrome 04/03/2017  . Disorder of bone, unspecified 04/03/2017  . Other specified health status 04/03/2017  . Other long term (current) drug therapy 04/03/2017  . Long term current use of opiate analgesic 04/03/2017  . Chronic sacroiliac joint pain 04/03/2017  . S/P repair of ventral hernia 03/03/2017  . Incisional hernia, without obstruction or gangrene 02/13/2017  . Right adrenal mass ([place]) 12/28/2016  . RIGHT Adrenal Mass 12/16/2016  . Fatigue 02/17/2016  . Other fatigue 02/17/2016  . Multiple lung nodules on CT 02/17/2016  . History of breast cancer 02/17/2016  . Peripheral neuropathy 02/17/2016  . Closed fracture of a rib 02/02/2015  . History of fundoplication 25/49/8264  . History of throat cancer 01/15/2015  . Acid reflux 01/15/2015  . Gonalgia 01/15/2015  . Closed fracture of pubis ([place]) 01/15/2015  . Pelvic fracture ([place]) 01/12/2015  . 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 Herpes simplex virus (HSV) epithelial keratitis 03/06/2013  . Nuclear sclerotic cataract 03/06/2013  . Onychia of finger 10/23/2008  . Odontogenic tumor 03/13/2008  . Tobacco use 06/14/2007   Durwin Reges DPT Durwin Reges 09/29/2020, 3:12 PM  [facility] [address] [place], [address] Phone: [phone]   Fax:  [phone]  Name: Denise Watts MRN: [id number] Date of Birth: [birth year]

## 2020-09-30 ENCOUNTER — Ambulatory Visit: Payer: Medicare HMO | Admitting: Physical Therapy

## 2020-10-01 ENCOUNTER — Telehealth: Payer: Self-pay

## 2020-10-01 NOTE — Telephone Encounter (Signed)
Patient called to find out the name of the cream or lotion that Dr. Marla Roe wanted her to use.  Patient said she lost the paper she had it written on.  Please call.

## 2020-10-02 ENCOUNTER — Ambulatory Visit: Payer: Medicare HMO | Attending: Plastic Surgery | Admitting: Physical Therapy

## 2020-10-02 ENCOUNTER — Encounter: Payer: Self-pay | Admitting: Physical Therapy

## 2020-10-02 ENCOUNTER — Other Ambulatory Visit: Payer: Self-pay

## 2020-10-02 ENCOUNTER — Telehealth: Payer: Self-pay

## 2020-10-02 DIAGNOSIS — M546 Pain in thoracic spine: Secondary | ICD-10-CM | POA: Insufficient documentation

## 2020-10-02 DIAGNOSIS — R293 Abnormal posture: Secondary | ICD-10-CM | POA: Diagnosis not present

## 2020-10-02 DIAGNOSIS — M6281 Muscle weakness (generalized): Secondary | ICD-10-CM | POA: Insufficient documentation

## 2020-10-02 MED ORDER — CYCLOBENZAPRINE HCL 5 MG PO TABS: 5.0000 mg | ORAL_TABLET | Freq: Three times a day (TID) | ORAL | 0 refills | Status: DC | PRN

## 2020-10-02 NOTE — Telephone Encounter (Signed)
Copied from [place] [id number]. Topic: General - Other >> Oct 02, 2020  4:32 PM Tessa Lerner A wrote: Reason for CRM: Patient has made a call requesting a modified prescription refill with enough to get them to their medication follow up appointment on 10/15/20  Patient shares that they need their cyclobenzaprine (FLEXERIL) 5 MG tablet to help with spasms   Please contact to further advise

## 2020-10-02 NOTE — Therapy (Signed)
[facility] [address], [place], [address] Phone: [phone]   Fax:  [phone]  Physical Therapy Treatment  Patient Details  Name: Denise Watts MRN: [id number] Date of Birth: [birth year] Referring Provider (PT): Sharmaine Base, DO   Encounter Date: 10/02/2020   PT End of Session - 10/02/20 0935    Visit Number 3    Number of Visits 17    Date for PT Re-Evaluation 10/20/20    Authorization Type [employer] Medicare HMO (VL TBD; $20 copay)    Authorization Time Period 09/22/20-11/17/20    PT Start Time 0930    PT Stop Time 1009    PT Time Calculation (min) 39 min    Activity Tolerance Patient tolerated treatment well;Patient limited by pain    Behavior During Therapy [facility] for tasks assessed/performed           Past Medical History:  Diagnosis Date  . Anxiety   . Arthritis    KNEE RIGHT  . Breast cancer [facility]) 1997   right breast lumpectomy with radiation  . Breast cancer ([place]) 11/15/2017   INVASIVE MAMMARY CARCINOMA/ triple negative  . 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 Cough 03/02/2017   INTERMTTENT DRY COUGH  . Depression   . GERD (gastroesophageal reflux disease)   . History of hiatal hernia   . Personal history of chemotherapy   . Personal history of radiation therapy   . Skin cancer   . Throat cancer [facility]) 1998   radiation    Past Surgical History:  Procedure Laterality Date  . BREAST BIOPSY Right 1997   positive  . BREAST BIOPSY Right 11/15/2017   INVASIVE MAMMARY CARCINOMA triple negative  . BREAST BIOPSY Right 07/20/2020   [place] bx, Q marker, path pending  . BREAST LUMPECTOMY Right 1997   2019 also  . BREAST LUMPECTOMY WITH SENTINEL LYMPH NODE BIOPSY Right 12/08/2017   Procedure: BREAST LUMPECTOMY WITH SENTINEL LYMPH NODE BX;  Surgeon: Robert Bellow, MD;  Location: [facility] OR[facility];  Service: General;  Laterality: Right;  . BREAST REDUCTION WITH MASTOPEXY Left 06/10/2019   Procedure:  Left breast mastopexy reduction for symmetry;  Surgeon: Wallace Going, DO;  Location: [facility];  Service: Plastics;  Laterality: Left;  total 2.5 hours  . COLONOSCOPY  2015  . COLONOSCOPY WITH PROPOFOL N/A 10/29/2019   Procedure: COLONOSCOPY WITH PROPOFOL;  Surgeon: Lucilla Lame, MD;  Location: [facility] ENDOSCOPY;  Service: Endoscopy;  Laterality: N/A;  . ESOPHAGOGASTRODUODENOSCOPY (EGD) WITH PROPOFOL N/A 05/24/2019   Procedure: ESOPHAGOGASTRODUODENOSCOPY (EGD) WITH PROPOFOL;  Surgeon: Lucilla Lame, MD;  Location: [facility] ENDOSCOPY;  Service: Endoscopy;  Laterality: N/A;  . ESOPHAGUS SURGERY    . KNEE SURGERY Right   . LIPOSUCTION WITH LIPOFILLING Right 06/10/2019   Procedure: right breast scar and capsule release with fat grafting;  Surgeon: Wallace Going, DO;  Location: [facility];  Service: Plastics;  Laterality: Right;  . PORT-A-CATH REMOVAL    . PORTACATH PLACEMENT Right 01/17/2018   Procedure: INSERTION PORT-A-CATH- RIGHT;  Surgeon: Robert Bellow, MD;  Location: [facility] ORS;  Service: General;  Laterality: Right;  . ROBOTIC ADRENALECTOMY Right 12/28/2016   Procedure: ROBOTIC ADRENALECTOMY;  Surgeon: Hollice Espy, MD;  Location: [facility] ORS;  Service: Urology;  Laterality: Right;  . SKIN CANCER EXCISION    . THROAT SURGERY  1998   throat cancer   . TUBAL LIGATION    . VENTRAL HERNIA REPAIR N/A  03/03/2017   Procedure: HERNIA REPAIR VENTRAL ADULT;  Surgeon: Clayburn Pert, MD;  Location: [facility] ORS;  Service: General;  Laterality: N/A;    There were no vitals filed for this visit.   Subjective Assessment - 10/02/20 0933    Subjective Patient reports she is having some back pain today, but is much less sore than last visit following. Reports pain is 4/10. She mowed her 6 acre lawn yesterday and is tired from this. completing HEP. No falls or changes from last visit    Pertinent History Denise Watts is a 36yoF who is referred to OPPT sports rehab by plastic  surgery after continued anterior bilateral thoracic. PMH: COPD, BrCA. Pt reports 2 bouts of BrCA 1997, 2019, both of which involving the right breast, both surgically addressed, both associated with back pain and anterior trunk pain. Pt also reports a fall in 2016 in which she sustained multiple rip fractures.    How long can you sit comfortably? not limited    How long can you stand comfortably? not limited    How long can you walk comfortably? not limited             Ther-Ex - Nustep seat seating 7 UE 9 L2 92mns for gentle scapular protraction/retraction and strengthening SPM >40 - Seated thoracic ext over towel roll BUE wand flexion/scaption x12 with min cuing for breath control with good carry over Standing alt thoracic rotation with dowel across shoulders x12 - Good mornings with wand across shoulders (barbell placement) x12 with cuing for scapular retraction with good carry over; with front load wand with 5# AW 2x 8 with fairly consistent cuing for scapular retraction with good carry over - Scapular retraction against wall BUE flex 90d x12 max cuing initially for technique without scapular elevation with good carry over -Band pull aparts standing against wall (horizontal abd) YTB 2x 10  - Bilat shoulder ER YTB seated with cuing for "elbows glued to thorax" with decent carry over - Seated theraball fwd rolls x12 with cuing for breath control with good carry over               PT Education - 10/02/20 0935    Education Details therex form/technique    Person(s) Educated Patient    Methods Explanation;Demonstration;Verbal cues    Comprehension Verbalized understanding;Returned demonstration;Verbal cues required            PT Short Term Goals - 09/22/20 1202      PT SHORT TERM GOAL #1   Title After 4 weeks pt to increase FOTO score by >5 points to suggestion improved pt perception of functional ability.    Time 4    Period Weeks    Status New    Target Date 10/20/20       PT SHORT TERM GOAL #2   Title After 4 weeks pt to demonstrate tolerance of seated thoracic A/ROM flexion, extension, and rotation without exacerbation of pain.    Baseline pain with active extension at eval, pain with OP in rotation and extension at eval.    Time 4    Period Weeks    Status New    Target Date 10/20/20             PT Long Term Goals - 10/02/20 1011      PT LONG TERM GOAL #1   Title After 8 weeks pt to demonstrate 4+/5 periscapular strength in Y and T position for heavy household ADLs    Baseline  10/02/20 3 bilat Y; 3+ bilat T    Time 8    Period Weeks    Status New      PT LONG TERM GOAL #2   Title After 8 weeks pt will demonstrate ability to lift and carry 10lb in each hand ~222f without exacerbation of pain to facilitate moving groceries into the house.    Baseline unable    Time 8    Period Weeks    Status New      PT LONG TERM GOAL #3   Title After 8 weeks pt will reports mild to no discomfort with palpation to the anterior and anterolateral caudal thoracic cage.    Baseline significant tenderness/pain at eval    Time 8    Period Weeks    Status New      PT LONG TERM GOAL #4   Title Patient will increase FOTO score to 46 to demonstrate predicted increase in functional mobility to complete ADLs    Baseline 10/02/20 55    Time 8    Period Weeks    Status New                 Plan - 10/02/20 0949    Clinical Impression Statement PT initiated therex progression for increased periscapular strength, and and thoracic mobility with success. Patien tis able to comply with all cuing for proper technique of therex with good motivation throughout session. Patient without increased pain throughout session. Expected rest breaks d/t empyshema, SOB. Patient with minimal pain this session allowing for progression of therex with neutral posture focus, patient encouraged to employ this in ADLs as well. PT will continue progression as able.    Personal Factors  and Comorbidities Age;Fitness;Education;Time since onset of injury/illness/exacerbation    Examination-Activity Limitations Lift;Transfers;Bed Mobility;Sleep;Carry    Examination-Participation Restrictions Laundry;Yard Work;Shop;Community Activity    Stability/Clinical Decision Making Evolving/Moderate complexity    Clinical Decision Making Moderate    Rehab Potential Good    PT Frequency 2x / week    PT Duration 8 weeks    PT Treatment/Interventions ADLs/Self Care Home Management;Electrical Stimulation;Moist Heat;DME Instruction;Gait training;Functional mobility training;Therapeutic activities;Therapeutic exercise;Manual techniques;Dry needling;Patient/family education    PT Next Visit Plan Review HEP activity, see if tolerated well, begin gentle periscapular strengthening, gentle A/ROM of T-spine    PT Home Exercise Plan Posterior shoulder circles, BUE overhead reach "in the tube"    Consulted and Agree with Plan of Care Patient           Patient will benefit from skilled therapeutic intervention in order to improve the following deficits and impairments:  Cardiopulmonary status limiting activity,Decreased activity tolerance,Decreased mobility,Hypomobility,Decreased range of motion,Increased muscle spasms,Postural dysfunction,Improper body mechanics  Visit Diagnosis: Pain in thoracic spine  Muscle weakness (generalized)  Abnormal posture     Problem List Patient Active Problem List   Diagnosis Date Noted  . Chronic cough 08/31/2020  . Encounter for screening colonoscopy   . Polyp of transverse colon   . Dysphagia   . Stricture and stenosis of esophagus   . Cervical spondylosis 03/18/2019  . 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 Need for prophylactic vaccination and inoculation against influenza 01/25/2019  . Postoperative breast asymmetry 01/04/2019  . Status post partial mastectomy 01/04/2019  . Vitamin D deficiency 12/12/2018  . Bursitis of left shoulder 11/21/2018   . Rotator cuff tendinitis, left 11/21/2018  . Genetic testing 02/01/2018  . Skin cancer 01/18/2018  . Goals of care, counseling/discussion 01/12/2018  . B12 deficiency  12/28/2017  . Malignant neoplasm of lower-outer quadrant of right breast of female, estrogen receptor negative ([place]) 11/24/2017  . Chronic abdominal pain (Primary Area of Pain)  04/03/2017  . Chronic low back pain (Secondary Area of Pain) (B) (R>L) 04/03/2017  . Chronic knee pain [facility] Area of Pain) (R) 04/03/2017  . Chronic pain syndrome 04/03/2017  . Disorder of bone, unspecified 04/03/2017  . Other specified health status 04/03/2017  . Other long term (current) drug therapy 04/03/2017  . Long term current use of opiate analgesic 04/03/2017  . Chronic sacroiliac joint pain 04/03/2017  . S/P repair of ventral hernia 03/03/2017  . Incisional hernia, without obstruction or gangrene 02/13/2017  . Right adrenal mass ([place]) 12/28/2016  . RIGHT Adrenal Mass 12/16/2016  . Fatigue 02/17/2016  . Other fatigue 02/17/2016  . Multiple lung nodules on CT 02/17/2016  . History of breast cancer 02/17/2016  . Peripheral neuropathy 02/17/2016  . Closed fracture of a rib 02/02/2015  . History of fundoplication 21/82/8833  . History of throat cancer 01/15/2015  . Acid reflux 01/15/2015  . Gonalgia 01/15/2015  . Closed fracture of pubis ([place]) 01/15/2015  . Pelvic fracture ([place]) 01/12/2015  . 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 Herpes simplex virus (HSV) epithelial keratitis 03/06/2013  . Nuclear sclerotic cataract 03/06/2013  . Onychia of finger 10/23/2008  . Odontogenic tumor 03/13/2008  . Tobacco use 06/14/2007   Durwin Reges DPT Durwin Reges 10/02/2020, 10:20 AM  [facility] [address] [place], [address] Phone: [phone]   Fax:  [phone]  Name: ARYANAH ENSLOW MRN: [id number] Date of Birth: 04-Jun-1955

## 2020-10-02 NOTE — Telephone Encounter (Addendum)
Spoke with the patient to see what was the cream been used for.  She stated it was for the scars on her legs,face, and arms.    Asked the patient was it [place] which we sale here in the office, or was it Mederma over the counter.  Patient stated that it was not Mederma, and she tried the College but it did not help with the scars.    Informed the patient that I spoke with Dr. Marla Roe and she stated that she was not sure what it was.  But she will try to remember and give her a call back.  Patient stated that she will keep trying to remember, or maybe she will be able to find the paper it was written on.//AB/CMA

## 2020-10-06 ENCOUNTER — Ambulatory Visit: Payer: Medicare HMO | Admitting: Physical Therapy

## 2020-10-06 ENCOUNTER — Other Ambulatory Visit: Payer: Self-pay

## 2020-10-06 ENCOUNTER — Encounter: Payer: Self-pay | Admitting: Physical Therapy

## 2020-10-06 DIAGNOSIS — C44622 Squamous cell carcinoma of skin of right upper limb, including shoulder: Secondary | ICD-10-CM | POA: Diagnosis not present

## 2020-10-06 DIAGNOSIS — D0472 Carcinoma in situ of skin of left lower limb, including hip: Secondary | ICD-10-CM | POA: Diagnosis not present

## 2020-10-06 DIAGNOSIS — M6281 Muscle weakness (generalized): Secondary | ICD-10-CM | POA: Diagnosis not present

## 2020-10-06 DIAGNOSIS — M546 Pain in thoracic spine: Secondary | ICD-10-CM | POA: Diagnosis not present

## 2020-10-06 DIAGNOSIS — D0471 Carcinoma in situ of skin of right lower limb, including hip: Secondary | ICD-10-CM | POA: Diagnosis not present

## 2020-10-06 DIAGNOSIS — R293 Abnormal posture: Secondary | ICD-10-CM

## 2020-10-06 NOTE — Therapy (Signed)
[facility] [address] [place], [address] Phone: [phone]   Fax:  [phone]  Physical Therapy Treatment  Patient Details  Name: Denise Watts MRN: [id number] Date of Birth: [birth year] Referring Provider (PT): Sharmaine Base, DO   Encounter Date: 10/06/2020   PT End of Session - 10/06/20 1525    Visit Number 4    Number of Visits 17    Date for PT Re-Evaluation 10/20/20    Authorization Type [employer] Medicare HMO (VL TBD; $20 copay)    Authorization Time Period 09/22/20-11/17/20    PT Start Time 1517    PT Stop Time 1600    PT Time Calculation (min) 43 min    Activity Tolerance Patient tolerated treatment well;Patient limited by pain    Behavior During Therapy [facility] for tasks assessed/performed           Past Medical History:  Diagnosis Date  . Anxiety   . Arthritis    KNEE RIGHT  . Breast cancer [facility]) 1997   right breast lumpectomy with radiation  . Breast cancer ([place]) 11/15/2017   INVASIVE MAMMARY CARCINOMA/ triple negative  . 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 Cough 03/02/2017   INTERMTTENT DRY COUGH  . Depression   . GERD (gastroesophageal reflux disease)   . History of hiatal hernia   . Personal history of chemotherapy   . Personal history of radiation therapy   . Skin cancer   . Throat cancer [facility]) 1998   radiation    Past Surgical History:  Procedure Laterality Date  . BREAST BIOPSY Right 1997   positive  . BREAST BIOPSY Right 11/15/2017   INVASIVE MAMMARY CARCINOMA triple negative  . BREAST BIOPSY Right 07/20/2020   [place] bx, Q marker, path pending  . BREAST LUMPECTOMY Right 1997   2019 also  . BREAST LUMPECTOMY WITH SENTINEL LYMPH NODE BIOPSY Right 12/08/2017   Procedure: BREAST LUMPECTOMY WITH SENTINEL LYMPH NODE BX;  Surgeon: Robert Bellow, MD;  Location: [facility] ORS;  Service: General;  Laterality: Right;  . BREAST REDUCTION WITH MASTOPEXY Left 06/10/2019   Procedure:  Left breast mastopexy reduction for symmetry;  Surgeon: Wallace Going, DO;  Location: [facility];  Service: Plastics;  Laterality: Left;  total 2.5 hours  . COLONOSCOPY  2015  . COLONOSCOPY WITH PROPOFOL N/A 10/29/2019   Procedure: COLONOSCOPY WITH PROPOFOL;  Surgeon: Lucilla Lame, MD;  Location: [facility] ENDOSCOPY;  Service: Endoscopy;  Laterality: N/A;  . ESOPHAGOGASTRODUODENOSCOPY (EGD) WITH PROPOFOL N/A 05/24/2019   Procedure: ESOPHAGOGASTRODUODENOSCOPY (EGD) WITH PROPOFOL;  Surgeon: Lucilla Lame, MD;  Location: [facility] ENDOSCOPY;  Service: Endoscopy;  Laterality: N/A;  . ESOPHAGUS SURGERY    . KNEE SURGERY Right   . LIPOSUCTION WITH LIPOFILLING Right 06/10/2019   Procedure: right breast scar and capsule release with fat grafting;  Surgeon: Wallace Going, DO;  Location: [place];  Service: Plastics;  Laterality: Right;  . PORT-A-CATH REMOVAL    . PORTACATH PLACEMENT Right 01/17/2018   Procedure: INSERTION PORT-A-CATH- RIGHT;  Surgeon: Robert Bellow, MD;  Location: [facility]S;  Service: General;  Laterality: Right;  . ROBOTIC ADRENALECTOMY Right 12/28/2016   Procedure: ROBOTIC ADRENALECTOMY;  Surgeon: Hollice Espy, MD;  Location: [facility] ORS;  Service: Urology;  Laterality: Right;  . SKIN CANCER EXCISION    . THROAT SURGERY  1998   throat cancer   . TUBAL LIGATION    . VENTRAL HERNIA REPAIR N/A  03/03/2017   Procedure: HERNIA REPAIR VENTRAL ADULT;  Surgeon: Clayburn Pert, MD;  Location: [facility] ORS;  Service: General;  Laterality: N/A;    There were no vitals filed for this visit.   Subjective Assessment - 10/06/20 1520    Subjective Patient reports her low back is bothering her more than her upper back today. She is having 5/10 LBP, 3/10 upper back pain. is still having "all over pain" from the fall making this hard to decipher. Is completing HEP somewhat. Was a little sore from last visit.    Pertinent History Denise Watts is a 34yoF who is referred to  OPPT sports rehab by plastic surgery after continued anterior bilateral thoracic. PMH: COPD, BrCA. Pt reports 2 bouts of BrCA 1997, 2019, both of which involving the right breast, both surgically addressed, both associated with back pain and anterior trunk pain. Pt also reports a fall in 2016 in which she sustained multiple rip fractures.    How long can you sit comfortably? not limited    How long can you stand comfortably? not limited    How long can you walk comfortably? not limited                Ther-Ex - Nustep seat seating 7 UE 9 L2 47mns for gentle scapular protraction/retraction and strengthening SPM >40 - Seated thoracic ext over towel roll BUE wand flexion/scaption x12 with min cuing for breath control with good carry over -theraball lumbar fwd roll x12 with cuing for breath control with good carry over  -Open book with hand behind head x12 bilat with good carry over of cuing for breath control (easier with L rotation) - TRX alt snow angels (rotation + ipsilateral lateral bend) x12 - TRX squat with cuing for LB stretch x12 - Good mornings/straight leg deadlift review x12 with cuing for scapular retraction with good carry over; with front load wand with 7.5# AW 2x 8 with fairly consistent cuing for scapular retraction with good carry over   Manual Thoracic segmental ext in sitting CTJ-T5 CPA        PT Education - 10/06/20 1525    Education Details therex form/technique    Person(s) Educated Patient    Methods Explanation;Demonstration;Verbal cues    Comprehension Verbalized understanding;Returned demonstration;Verbal cues required            PT Short Term Goals - 09/22/20 1202      PT SHORT TERM GOAL #1   Title After 4 weeks pt to increase FOTO score by >5 points to suggestion improved pt perception of functional ability.    Time 4    Period Weeks    Status New    Target Date 10/20/20      PT SHORT TERM GOAL #2   Title After 4 weeks pt to demonstrate  tolerance of seated thoracic A/ROM flexion, extension, and rotation without exacerbation of pain.    Baseline pain with active extension at eval, pain with OP in rotation and extension at eval.    Time 4    Period Weeks    Status New    Target Date 10/20/20             PT Long Term Goals - 10/02/20 1011      PT LONG TERM GOAL #1   Title After 8 weeks pt to demonstrate 4+/5 periscapular strength in Y and T position for heavy household ADLs    Baseline 10/02/20 3 bilat Y; 3+ bilat T  Time 8    Period Weeks    Status New      PT LONG TERM GOAL #2   Title After 8 weeks pt will demonstrate ability to lift and carry 10lb in each hand ~240f without exacerbation of pain to facilitate moving groceries into the house.    Baseline unable    Time 8    Period Weeks    Status New      PT LONG TERM GOAL #3   Title After 8 weeks pt will reports mild to no discomfort with palpation to the anterior and anterolateral caudal thoracic cage.    Baseline significant tenderness/pain at eval    Time 8    Period Weeks    Status New      PT LONG TERM GOAL #4   Title Patient will increase FOTO score to 46 to demonstrate predicted increase in functional mobility to complete ADLs    Baseline 10/02/20 55    Time 8    Period Weeks    Status New                 Plan - 10/06/20 1546    Clinical Impression Statement PT continued therex progression for increased thoracic and lumbar mobility and strengthening with success. Patient is able to comply with all cuing for proper technique of therex with good motivation throughout session. Patient is still very senstive to manual attempts. Pt reports decreased pain to 3/10 overall following, and that she feels "looser" . PT encouraged mobility therex in HEP as well. PT will continue progression as able.    Personal Factors and Comorbidities Age;Fitness;Education;Time since onset of injury/illness/exacerbation    Examination-Activity Limitations  Lift;Transfers;Bed Mobility;Sleep;Carry    Examination-Participation Restrictions Laundry;Yard Work;Shop;Community Activity    Stability/Clinical Decision Making Evolving/Moderate complexity    Clinical Decision Making Moderate    Rehab Potential Good    PT Frequency 2x / week    PT Duration 8 weeks    PT Treatment/Interventions ADLs/Self Care Home Management;Electrical Stimulation;Moist Heat;DME Instruction;Gait training;Functional mobility training;Therapeutic activities;Therapeutic exercise;Manual techniques;Dry needling;Patient/family education    PT Next Visit Plan Review HEP activity, see if tolerated well, begin gentle periscapular strengthening, gentle A/ROM of T-spine    PT Home Exercise Plan Posterior shoulder circles, BUE overhead reach "in the tube"    Consulted and Agree with Plan of Care Patient           Patient will benefit from skilled therapeutic intervention in order to improve the following deficits and impairments:  Cardiopulmonary status limiting activity,Decreased activity tolerance,Decreased mobility,Hypomobility,Decreased range of motion,Increased muscle spasms,Postural dysfunction,Improper body mechanics  Visit Diagnosis: Pain in thoracic spine  Muscle weakness (generalized)  Abnormal posture     Problem List Patient Active Problem List   Diagnosis Date Noted  . Chronic cough 08/31/2020  . Encounter for screening colonoscopy   . Polyp of transverse colon   . Dysphagia   . Stricture and stenosis of esophagus   . Cervical spondylosis 03/18/2019  . 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 Need for prophylactic vaccination and inoculation against influenza 01/25/2019  . Postoperative breast asymmetry 01/04/2019  . Status post partial mastectomy 01/04/2019  . Vitamin D deficiency 12/12/2018  . Bursitis of left shoulder 11/21/2018  . Rotator cuff tendinitis, left 11/21/2018  . Genetic testing 02/01/2018  . Skin cancer 01/18/2018  . Goals of care,  counseling/discussion 01/12/2018  . B12 deficiency 12/28/2017  . Malignant neoplasm of lower-outer quadrant of right breast of female, estrogen receptor negative (  [place]) 11/24/2017  . Chronic abdominal pain (Primary Area of Pain)  04/03/2017  . Chronic low back pain (Secondary Area of Pain) (B) (R>L) 04/03/2017  . Chronic knee pain [facility] Area of Pain) (R) 04/03/2017  . Chronic pain syndrome 04/03/2017  . Disorder of bone, unspecified 04/03/2017  . Other specified health status 04/03/2017  . Other long term (current) drug therapy 04/03/2017  . Long term current use of opiate analgesic 04/03/2017  . Chronic sacroiliac joint pain 04/03/2017  . S/P repair of ventral hernia 03/03/2017  . Incisional hernia, without obstruction or gangrene 02/13/2017  . Right adrenal mass ([place]) 12/28/2016  . RIGHT Adrenal Mass 12/16/2016  . Fatigue 02/17/2016  . Other fatigue 02/17/2016  . Multiple lung nodules on CT 02/17/2016  . History of breast cancer 02/17/2016  . Peripheral neuropathy 02/17/2016  . Closed fracture of a rib 02/02/2015  . History of fundoplication 28/31/5176  . History of throat cancer 01/15/2015  . Acid reflux 01/15/2015  . Gonalgia 01/15/2015  . Closed fracture of pubis ([place]) 01/15/2015  . Pelvic fracture ([place]) 01/12/2015  . 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 Herpes simplex virus (HSV) epithelial keratitis 03/06/2013  . Nuclear sclerotic cataract 03/06/2013  . Onychia of finger 10/23/2008  . Odontogenic tumor 03/13/2008  . Tobacco use 06/14/2007   Durwin Reges DPT Durwin Reges 10/06/2020, 4:01 PM  [facility] [address], [place], [address] Phone: [phone]   Fax:  [phone]  Name: Denise Watts MRN: [id number] Date of Birth: 08-22-1955

## 2020-10-08 ENCOUNTER — Encounter: Payer: Medicare HMO | Admitting: Physical Therapy

## 2020-10-12 ENCOUNTER — Other Ambulatory Visit: Payer: Self-pay | Admitting: Family Medicine

## 2020-10-12 DIAGNOSIS — R059 Cough, unspecified: Secondary | ICD-10-CM

## 2020-10-12 NOTE — Telephone Encounter (Signed)
Requested Prescriptions  Pending Prescriptions Disp Refills  . benzonatate (TESSALON) 200 MG capsule [Pharmacy Med Name: BENZONATATE 200 MG CAP] 30 capsule 0    Sig: TAKE 1 CAPSULE BY MOUTH 3 TIMES DAILY ASNEEDED     Ear, Nose, and Throat:  Antitussives/Expectorants Passed - 10/12/2020 12:59 PM      Passed - Valid encounter within last 12 months    Recent Outpatient Visits          2 months ago Upper respiratory tract infection, unspecified type   [place], Clearnce Sorrel, [place]   3 months ago [place], [place], [place]   4 months ago Cough   [place], [place]   6 months ago COPD exacerbation [facility])   [place], Vickki Muff, PA-C   7 months ago [place], PA-C      Future Appointments            In 3 days Chrismon, Vickki Muff, PA-C [employer], [place]   In 1 week Kate Sable, MD [facility], [facility]   In 2 weeks Chesley Mires, MD [facility]

## 2020-10-13 ENCOUNTER — Ambulatory Visit: Payer: Medicare HMO | Admitting: Physical Therapy

## 2020-10-13 ENCOUNTER — Encounter: Payer: Self-pay | Admitting: Physical Therapy

## 2020-10-13 DIAGNOSIS — M546 Pain in thoracic spine: Secondary | ICD-10-CM | POA: Diagnosis not present

## 2020-10-13 DIAGNOSIS — M6281 Muscle weakness (generalized): Secondary | ICD-10-CM | POA: Diagnosis not present

## 2020-10-13 DIAGNOSIS — R293 Abnormal posture: Secondary | ICD-10-CM | POA: Diagnosis not present

## 2020-10-13 NOTE — Therapy (Signed)
[facility] [address], [place], [address] Phone: [phone]   Fax:  [phone]  Physical Therapy Treatment  Patient Details  Name: Denise Watts MRN: [id number] Date of Birth: Jun 20, 1955 Referring Provider (PT): Sharmaine Base, DO   Encounter Date: 10/13/2020   PT End of Session - 10/13/20 1439     Visit Number 5    Number of Visits 17    Date for PT Re-Evaluation 10/20/20    Authorization Type [employer] Medicare HMO (VL TBD; $20 copay)    Authorization Time Period 09/22/20-11/17/20    PT Start Time 1431    PT Stop Time 1514    PT Time Calculation (min) 43 min    Activity Tolerance Patient tolerated treatment well    Behavior During Therapy [facility] for tasks assessed/performed             Past Medical History:  Diagnosis Date   Anxiety    Arthritis    KNEE RIGHT   Breast cancer ([place]) 1997   right breast lumpectomy with radiation   Breast cancer ([place]) 11/15/2017   INVASIVE MAMMARY CARCINOMA/ triple negative   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   Cough 03/02/2017   INTERMTTENT DRY COUGH   Depression    GERD (gastroesophageal reflux disease)    History of hiatal hernia    Personal history of chemotherapy    Personal history of radiation therapy    Skin cancer    Throat cancer ([place]) 1998   radiation    Past Surgical History:  Procedure Laterality Date   BREAST BIOPSY Right 1997   positive   BREAST BIOPSY Right 11/15/2017   INVASIVE MAMMARY CARCINOMA triple negative   BREAST BIOPSY Right 07/20/2020   [place] bx, Q marker, path pending   BREAST LUMPECTOMY Right 1997   2019 also   BREAST LUMPECTOMY WITH SENTINEL LYMPH NODE BIOPSY Right 12/08/2017   Procedure: BREAST LUMPECTOMY WITH SENTINEL LYMPH NODE BX;  Surgeon: Robert Bellow, MD;  Location: [facility];  Service: General;  Laterality: Right;   BREAST REDUCTION WITH MASTOPEXY Left 06/10/2019   Procedure: Left breast mastopexy reduction for  symmetry;  Surgeon: Wallace Going, DO;  Location: [facility];  Service: Plastics;  Laterality: Left;  total 2.5 hours   COLONOSCOPY  2015   COLONOSCOPY WITH PROPOFOL N/A 10/29/2019   Procedure: COLONOSCOPY WITH PROPOFOL;  Surgeon: Lucilla Lame, MD;  Location: [facility] ENDOSCOPY;  Service: Endoscopy;  Laterality: N/A;   ESOPHAGOGASTRODUODENOSCOPY (EGD) WITH PROPOFOL N/A 05/24/2019   Procedure: ESOPHAGOGASTRODUODENOSCOPY (EGD) WITH PROPOFOL;  Surgeon: Lucilla Lame, MD;  Location: [facility] ENDOSCOPY;  Service: Endoscopy;  Laterality: N/A;   ESOPHAGUS SURGERY     KNEE SURGERY Right    LIPOSUCTION WITH LIPOFILLING Right 06/10/2019   Procedure: right breast scar and capsule release with fat grafting;  Surgeon: Wallace Going, DO;  Location: [facility];  Service: Plastics;  Laterality: Right;   PORT-A-CATH REMOVAL     PORTACATH PLACEMENT Right 01/17/2018   Procedure: INSERTION PORT-A-CATH- RIGHT;  Surgeon: Robert Bellow, MD;  Location: [facility];  Service: General;  Laterality: Right;   ROBOTIC ADRENALECTOMY Right 12/28/2016   Procedure: ROBOTIC ADRENALECTOMY;  Surgeon: Hollice Espy, MD;  Location: [facility];  Service: Urology;  Laterality: Right;   SKIN CANCER EXCISION     THROAT SURGERY  1998   throat cancer    TUBAL LIGATION     VENTRAL HERNIA REPAIR N/A  03/03/2017   Procedure: HERNIA REPAIR VENTRAL ADULT;  Surgeon: Clayburn Pert, MD;  Location: [facility];  Service: General;  Laterality: N/A;    There were no vitals filed for this visit.   Subjective Assessment - 10/13/20 1435     Subjective Patient report to therapy stating that most of her pain is coming from her low back rating as 5/10. Pt states she took a hot shower and pain medication before arriving to physical therapy. Pt states that pain shoots up the middle of her back and that she is experiences frequent cramping. Pt also states she is having urinary incontinence recently due to frequent UTIs that  she is being seen for.    Pertinent History Denise Watts is a 73yoF who is referred to OPPT sports rehab by plastic surgery after continued anterior bilateral thoracic. PMH: COPD, BrCA. Pt reports 2 bouts of BrCA 1997, 2019, both of which involving the right breast, both surgically addressed, both associated with back pain and anterior trunk pain. Pt also reports a fall in 2016 in which she sustained multiple rip fractures.              Therapeutic Exercise  Nu step Level 2 x 5 min UE only SPM <40  TRX Squats to 2 x 10 reps cueing to sink buttocks back and truck forward  TRX Snow angles 1 x 10 reps bilat cueing to drop unilateral  Pull aparts supinated elbows extended (RTB): 1x 10 reps  Pull aparts pronated with elbow extended (RTB): 1 x 10 reps  Seated FWD leaning on theraball  2 x 10 reps cueing to breathe in on way up and out on the way down.  Manual therapy  Thoracic segmental ext T/L junction- T2 Cervical segmental ext C/T Junction - C3  Supine knee drop bilat 1 x 10 each direction hold and stretch* increased side cramping                          PT Education - 10/13/20 1532     Education Details Patient educated on the importance of hydation and to drink plenty of fluids to prevent cramping.    Person(s) Educated Patient    Methods Explanation    Comprehension Verbalized understanding              PT Short Term Goals - 09/22/20 1202       PT SHORT TERM GOAL #1   Title After 4 weeks pt to increase FOTO score by >5 points to suggestion improved pt perception of functional ability.    Time 4    Period Weeks    Status New    Target Date 10/20/20      PT SHORT TERM GOAL #2   Title After 4 weeks pt to demonstrate tolerance of seated thoracic A/ROM flexion, extension, and rotation without exacerbation of pain.    Baseline pain with active extension at eval, pain with OP in rotation and extension at eval.    Time 4    Period Weeks     Status New    Target Date 10/20/20               PT Long Term Goals - 10/02/20 1011       PT LONG TERM GOAL #1   Title After 8 weeks pt to demonstrate 4+/5 periscapular strength in Y and T position for heavy household ADLs    Baseline 10/02/20 3 bilat Y; 3+  bilat T    Time 8    Period Weeks    Status New      PT LONG TERM GOAL #2   Title After 8 weeks pt will demonstrate ability to lift and carry 10lb in each hand ~216f without exacerbation of pain to facilitate moving groceries into the house.    Baseline unable    Time 8    Period Weeks    Status New      PT LONG TERM GOAL #3   Title After 8 weeks pt will reports mild to no discomfort with palpation to the anterior and anterolateral caudal thoracic cage.    Baseline significant tenderness/pain at eval    Time 8    Period Weeks    Status New      PT LONG TERM GOAL #4   Title Patient will increase FOTO score to 46 to demonstrate predicted increase in functional mobility to complete ADLs    Baseline 10/02/20 55    Time 8    Period Weeks    Status New                   Plan - 10/13/20 1522     Clinical Impression Statement Pt tolerated session fair due to muscle cramping during thoracic and lumbar mobility exercises. Despite muscle cramping pt was able to complete all exercises this day with minimal cueing and good motivation throughout session. Patient will continue to benefit from skilled therapy in order to treat impairments and address set goals.    Personal Factors and Comorbidities Age;Fitness;Education;Time since onset of injury/illness/exacerbation    Examination-Activity Limitations Lift;Transfers;Bed Mobility;Sleep;Carry    Examination-Participation Restrictions Laundry;Yard Work;Shop;Community Activity    PT Treatment/Interventions ADLs/Self Care Home Management;Electrical Stimulation;Moist Heat;DME Instruction;Gait training;Functional mobility training;Therapeutic activities;Therapeutic exercise;Manual  techniques;Dry needling;Patient/family education    PT Next Visit Plan Continue gentle periscapular strengthening, gentle A/ROM of T-spine    PT Home Exercise Plan Posterior shoulder circles, BUE overhead reach "in the tube"             Patient will benefit from skilled therapeutic intervention in order to improve the following deficits and impairments:  Cardiopulmonary status limiting activity, Decreased activity tolerance, Decreased mobility, Hypomobility, Decreased range of motion, Increased muscle spasms, Postural dysfunction, Improper body mechanics  Visit Diagnosis: Pain in thoracic spine     Problem List Patient Active Problem List   Diagnosis Date Noted   Chronic cough 08/31/2020   Encounter for screening colonoscopy    Polyp of transverse colon    Dysphagia    Stricture and stenosis of esophagus    Cervical spondylosis 03/18/2019   [place]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   Need for prophylactic vaccination and inoculation against influenza 01/25/2019   Postoperative breast asymmetry 01/04/2019   Status post partial mastectomy 01/04/2019   Vitamin D deficiency 12/12/2018   Bursitis of left shoulder 11/21/2018   Rotator cuff tendinitis, left 11/21/2018   Genetic testing 02/01/2018   Skin cancer 01/18/2018   Goals of care, counseling/discussion 01/12/2018   B12 deficiency 12/28/2017   Malignant neoplasm of lower-outer quadrant of right breast of [place], [place] 11/24/2017   Chronic abdominal pain (Primary Area of Pain)  04/03/2017   Chronic low back pain (Secondary Area of Pain) (B) (R>L) 04/03/2017   Chronic knee pain (Tertiary Area of Pain) (R) 04/03/2017   Chronic pain syndrome 04/03/2017   Disorder of bone, unspecified 04/03/2017   Other specified health status 04/03/2017   Other long term (  current) drug therapy 04/03/2017   Long term current use of opiate analgesic 04/03/2017   Chronic sacroiliac joint pain 04/03/2017   S/P repair of  ventral hernia 03/03/2017   Incisional hernia, without obstruction or gangrene 02/13/2017   Right adrenal mass ([place]) 12/28/2016   RIGHT Adrenal Mass 12/16/2016   Fatigue 02/17/2016   Other fatigue 02/17/2016   Multiple lung nodules on CT 02/17/2016   History of breast cancer 02/17/2016   Peripheral neuropathy 02/17/2016   Closed fracture of a rib 02/02/2015   History of fundoplication 01/77/9390   History of throat cancer 01/15/2015   Acid reflux 01/15/2015   Gonalgia 01/15/2015   Closed fracture of pubis ([place]) 01/15/2015   Pelvic fracture ([place]) 01/12/2015   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, [place], [address] Phone: [phone]   Fax:  [phone]  Name: Denise Watts MRN: [id number] Date of Birth: 08-02-1955

## 2020-10-15 ENCOUNTER — Telehealth: Payer: Self-pay

## 2020-10-15 ENCOUNTER — Ambulatory Visit (INDEPENDENT_AMBULATORY_CARE_PROVIDER_SITE_OTHER): Payer: Medicare HMO | Admitting: Family Medicine

## 2020-10-15 ENCOUNTER — Other Ambulatory Visit: Payer: Self-pay

## 2020-10-15 ENCOUNTER — Encounter: Payer: Self-pay | Admitting: Family Medicine

## 2020-10-15 VITALS — BP 118/82 | HR 84 | Temp 97.9°F | Resp 16 | Ht 61.0 in | Wt 146.2 lb

## 2020-10-15 DIAGNOSIS — F32A Depression, unspecified: Secondary | ICD-10-CM | POA: Diagnosis not present

## 2020-10-15 DIAGNOSIS — Z853 Personal history of malignant neoplasm of breast: Secondary | ICD-10-CM | POA: Diagnosis not present

## 2020-10-15 DIAGNOSIS — Z85819 Personal history of malignant neoplasm of unspecified site of lip, oral cavity, and pharynx: Secondary | ICD-10-CM

## 2020-10-15 DIAGNOSIS — R300 Dysuria: Secondary | ICD-10-CM | POA: Diagnosis not present

## 2020-10-15 DIAGNOSIS — R252 Cramp and spasm: Secondary | ICD-10-CM | POA: Diagnosis not present

## 2020-10-15 DIAGNOSIS — F419 Anxiety disorder, unspecified: Secondary | ICD-10-CM | POA: Diagnosis not present

## 2020-10-15 DIAGNOSIS — F439 Reaction to severe stress, unspecified: Secondary | ICD-10-CM | POA: Insufficient documentation

## 2020-10-15 NOTE — Telephone Encounter (Signed)
Copied from [place] [id number]. Topic: General - Other >> Oct 15, 2020 11:08 AM Tessa Lerner A wrote: Reason for CRM: Patient has called the practice to notify Prov. Chrismon that they would like to resume taking rOPINIRole (REQUIP) 0.25 MG tablet  Please contact to advise further as needed
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
## 2020-10-16 ENCOUNTER — Ambulatory Visit: Payer: Medicare HMO | Admitting: Plastic Surgery

## 2020-10-16 LAB — BASIC METABOLIC PANEL
BUN/Creatinine Ratio: 15 (ref 12–28)
BUN: 12 mg/dL (ref 8–27)
CO2: 23 mmol/L (ref 20–29)
Calcium: 9.5 mg/dL (ref 8.7–10.3)
Chloride: 101 mmol/L (ref 96–106)
Creatinine, Ser: 0.8 mg/dL (ref 0.57–1.00)
Glucose: 107 mg/dL — ABNORMAL HIGH (ref 65–99)
Potassium: 4.4 mmol/L (ref 3.5–5.2)
Sodium: 142 mmol/L (ref 134–144)
eGFR: 81 mL/min/{1.73_m2} (ref >59)

## 2020-10-16 LAB — CBC WITH DIFFERENTIAL/PLATELET
Basophils Absolute: 0 10*3/uL (ref 0.0–0.2)
Basos: 0 %
EOS (ABSOLUTE): 0.1 10*3/uL (ref 0.0–0.4)
Eos: 2 %
Hematocrit: 40.9 % (ref 34.0–46.6)
Hemoglobin: 13.6 g/dL (ref 11.1–15.9)
Immature Grans (Abs): 0 10*3/uL (ref 0.0–0.1)
Immature Granulocytes: 0 %
Lymphocytes Absolute: 0.9 10*3/uL (ref 0.7–3.1)
Lymphs: 11 %
MCH: 32.5 pg (ref 26.6–33.0)
MCHC: 33.3 g/dL (ref 31.5–35.7)
MCV: 98 fL — ABNORMAL HIGH (ref 79–97)
Monocytes Absolute: 0.7 10*3/uL (ref 0.1–0.9)
Monocytes: 8 %
Neutrophils Absolute: 6.5 10*3/uL (ref 1.4–7.0)
Neutrophils: 79 %
Platelets: 247 10*3/uL (ref 150–450)
RBC: 4.19 x10E6/uL (ref 3.77–5.28)
RDW: 12 % (ref 11.7–15.4)
WBC: 8.3 10*3/uL (ref 3.4–10.8)

## 2020-10-16 LAB — MAGNESIUM: Magnesium: 2.1 mg/dL (ref 1.6–2.3)

## 2020-10-19 ENCOUNTER — Encounter: Payer: Self-pay | Admitting: Cardiology

## 2020-10-19 ENCOUNTER — Other Ambulatory Visit: Payer: Self-pay

## 2020-10-19 ENCOUNTER — Other Ambulatory Visit: Payer: Self-pay | Admitting: Family Medicine

## 2020-10-19 ENCOUNTER — Ambulatory Visit: Payer: Medicare HMO | Admitting: Cardiology

## 2020-10-19 VITALS — BP 100/66 | HR 85 | Ht 61.0 in | Wt 145.0 lb

## 2020-10-19 DIAGNOSIS — I2510 Atherosclerotic heart disease of native coronary artery without angina pectoris: Secondary | ICD-10-CM

## 2020-10-19 DIAGNOSIS — R0609 Other forms of dyspnea: Secondary | ICD-10-CM

## 2020-10-19 DIAGNOSIS — R0600 Dyspnea, unspecified: Secondary | ICD-10-CM | POA: Diagnosis not present

## 2020-10-19 DIAGNOSIS — Z13220 Encounter for screening for lipoid disorders: Secondary | ICD-10-CM | POA: Diagnosis not present

## 2020-10-19 DIAGNOSIS — G2581 Restless legs syndrome: Secondary | ICD-10-CM

## 2020-10-19 MED ORDER — ASPIRIN EC 81 MG PO TBEC: 81.0000 mg | DELAYED_RELEASE_TABLET | Freq: Every day | ORAL | 3 refills | Status: DC

## 2020-10-19 MED ORDER — ROPINIROLE HCL 0.25 MG PO TABS: ORAL_TABLET | ORAL | 1 refills | Status: DC

## 2020-10-19 NOTE — Progress Notes (Signed)
Cardiology Office Note:    Date:  10/19/2020   ID:  TIMOTHEA BODENHEIMER, DOB March 24, 1956, MRN [id number]  PCP:  Margo Common, PA-C   [facility] Providers Cardiologist:  None     Referring MD: Sindy Guadeloupe, MD   Chief Complaint  Patient presents with   New Patient (Initial Visit)    Referred by Pcp for Referred by PCP for Aortic atherosclerosis. Patient c.o chest pain and SOB. Meds reviewed verbally with patient.     History of Present Illness:    DONEISHA IVEY is a 65 y.o. female with a hx of GERD, breast cancer, SCC vocal cord s/p surgery and RT 1998, previous smoker x30+ years,  who presents due to aortic atherosclerosis.  Patient has a history of lung nodules,  former smoker, underwent CT chest abdomen pelvis with contrast for cancer eval per her oncology team.  Aortic atherosclerosis was noted, calcifications were noted in the proximal LAD/distal left main.  She states having chest discomfort with exertion, also endorses shortness of breath.  Her mother passed from heart attack in her 78s.  Denies any personal history of heart disease.  Has had multiple cancers, currently followed up with oncology.  Past Medical History:  Diagnosis Date   Anxiety    Arthritis    KNEE RIGHT   Breast cancer ([place]) 1997   right breast lumpectomy with radiation   Breast cancer ([place]) 11/15/2017   INVASIVE MAMMARY CARCINOMA/ triple negative   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HCC) 11/10/2014   Cough 03/02/2017   INTERMTTENT DRY COUGH   Depression    GERD (gastroesophageal reflux disease)    History of hiatal hernia    Personal history of chemotherapy    Personal history of radiation therapy    Skin cancer    Throat cancer ([place]) 1998   radiation    Past Surgical History:  Procedure Laterality Date   BREAST BIOPSY Right 1997   positive   BREAST BIOPSY Right 11/15/2017   INVASIVE MAMMARY CARCINOMA triple negative   BREAST BIOPSY Right 07/20/2020   [place] bx, Q marker, path pending   BREAST  LUMPECTOMY Right 1997   2019 also   BREAST LUMPECTOMY WITH SENTINEL LYMPH NODE BIOPSY Right 12/08/2017   Procedure: BREAST LUMPECTOMY WITH SENTINEL LYMPH NODE BX;  Surgeon: Robert Bellow, MD;  Location: [facility];  Service: General;  Laterality: Right;   BREAST REDUCTION WITH MASTOPEXY Left 06/10/2019   Procedure: Left breast mastopexy reduction for symmetry;  Surgeon: Wallace Going, DO;  Location: [facility];  Service: Plastics;  Laterality: Left;  total 2.5 hours   COLONOSCOPY  2015   COLONOSCOPY WITH PROPOFOL N/A 10/29/2019   Procedure: COLONOSCOPY WITH PROPOFOL;  Surgeon: Lucilla Lame, MD;  Location: [facility];  Service: Endoscopy;  Laterality: N/A;   ESOPHAGOGASTRODUODENOSCOPY (EGD) WITH PROPOFOL N/A 05/24/2019   Procedure: ESOPHAGOGASTRODUODENOSCOPY (EGD) WITH PROPOFOL;  Surgeon: Lucilla Lame, MD;  Location: [facility] ENDOSCOPY;  Service: Endoscopy;  Laterality: N/A;   ESOPHAGUS SURGERY     KNEE SURGERY Right    LIPOSUCTION WITH LIPOFILLING Right 06/10/2019   Procedure: right breast scar and capsule release with fat grafting;  Surgeon: Wallace Going, DO;  Location: [place];  Service: Plastics;  Laterality: Right;   PORT-A-CATH REMOVAL     PORTACATH PLACEMENT Right 01/17/2018   Procedure: INSERTION PORT-A-CATH- RIGHT;  Surgeon: Robert Bellow, MD;  Location: [facility];  Service: General;  Laterality: Right;   ROBOTIC ADRENALECTOMY  Right 12/28/2016   Procedure: ROBOTIC ADRENALECTOMY;  Surgeon: Hollice Espy, MD;  Location: [facility];  Service: Urology;  Laterality: Right;   SKIN CANCER EXCISION     THROAT SURGERY  1998   throat cancer    TUBAL LIGATION     VENTRAL HERNIA REPAIR N/A 03/03/2017   Procedure: HERNIA REPAIR VENTRAL ADULT;  Surgeon: Clayburn Pert, MD;  Location: [facility];  Service: General;  Laterality: N/A;    Current Medications: Current Meds  Medication Sig   aspirin EC 81 MG tablet Take 1 tablet (81 mg total) by mouth  daily. Swallow whole.   benzonatate (TESSALON) 200 MG capsule TAKE 1 CAPSULE BY MOUTH 3 TIMES DAILY ASNEEDED   Budeson-Glycopyrrol-Formoterol (BREZTRI AEROSPHERE) 160-9-4.8 MCG/ACT AERO Inhale 2 puffs into the lungs in the morning and at bedtime.   busPIRone (BUSPAR) 5 MG tablet Take 1 tablet (5 mg total) by mouth 2 (two) times daily.   cyclobenzaprine (FLEXERIL) 5 MG tablet Take 1 tablet (5 mg total) by mouth 3 (three) times daily as needed for muscle spasms.   DULoxetine (CYMBALTA) 60 MG capsule Take by mouth.   fluticasone (FLONASE) 50 MCG/ACT nasal spray Place 1 spray into both nostrils at bedtime.   gabapentin (NEURONTIN) 300 MG capsule TAKE 1 CAPSULE BY MOUTH 3 TIMES A DAY   omeprazole (PRILOSEC) 40 MG capsule TAKE 1 CAPSULE BY MOUTH EVERY DAY (MORNING)   ondansetron (ZOFRAN) 4 MG tablet Take 1 tablet (4 mg total) by mouth every 8 (eight) hours as needed for nausea or vomiting.   rOPINIRole (REQUIP) 0.25 MG tablet Take 0.25 mg by mouth 3 (three) times daily.      Allergies:   Patient has no known allergies.   Social History   Socioeconomic History   Marital status: Married    Spouse name: Not on file   Number of children: 4   Years of education: Not on file   Highest education level: 10th grade  Occupational History   Occupation: retired  Tobacco Use   Smoking status: Former    Packs/day: 1.50    Years: 30.00    Pack years: 45.00    Types: Cigarettes    Quit date: 05/03/1995    Years since quitting: 25.4   Smokeless tobacco: Never  Vaping Use   Vaping Use: Never used  Substance and Sexual Activity   Alcohol use: Yes    Alcohol/week: 4.0 standard drinks    Types: 4 Shots of liquor per week    Comment: 2 per night on weekends   Drug use: No   Sexual activity: Not on file  Other Topics Concern   Not on file  Social History Narrative   Not on file   Social Determinants of Health   Financial Resource Strain: Low Risk    Difficulty of Paying Living Expenses: Not hard  at all  Food Insecurity: No Food Insecurity   Worried About Charity fundraiser in the Last Year: Never true   [place] in the Last Year: Never true  Transportation Needs: No Transportation Needs   Lack of Transportation (Medical): No   Lack of Transportation (Non-Medical): No  Physical Activity: Insufficiently Active   Days of Exercise per Week: 3 days   Minutes of Exercise per Session: 30 min  Stress: Stress Concern Present   Feeling of Stress : Rather much  Social Connections: Moderately Isolated   Frequency of Communication with Friends and Family: More than three times a week  Frequency of Social Gatherings with Friends and Family: More than three times a week   Attends Religious Services: Never   Marine scientist or Organizations: No   Attends Music therapist: Never   Marital Status: Married     Family History: The patient's family history includes Cancer in her paternal aunt, paternal grandmother, and sister; Cancer (age of onset: 50) in her sister; Diabetes in her mother; Heart attack in her mother. There is no history of Prostate cancer, Kidney cancer, or Bladder Cancer.  ROS:   Please see the history of present illness.     All other systems reviewed and are negative.  EKGs/Labs/Other Studies Reviewed:    The following studies were reviewed today:   EKG:  EKG is  ordered today.  The ekg ordered today demonstrates normal sinus rhythm, normal ECG.  Recent Labs: 08/20/2020: ALT 18 10/15/2020: BUN 12; Creatinine, Ser 0.80; Hemoglobin 13.6; Magnesium 2.1; Platelets 247; Potassium 4.4; Sodium 142  Recent Lipid Panel No results found for: CHOL, TRIG, HDL, CHOLHDL, VLDL, LDLCALC, LDLDIRECT   Risk Assessment/Calculations:          Physical Exam:    VS:  BP 100/66 (BP Location: Left Arm, Patient Position: Sitting, Cuff Size: Normal)   Pulse 85   Ht 5\' 1"  (1.549 m)   Wt 145 lb (65.8 kg)   SpO2 92%   BMI 27.40 kg/m     Wt Readings from  Last 3 Encounters:  10/19/20 145 lb (65.8 kg)  10/15/20 146 lb 3.2 oz (66.3 kg)  08/28/20 143 lb (64.9 kg)     GEN:  Well nourished, well developed in no acute distress HEENT: Normal NECK: No JVD; No carotid bruits LYMPHATICS: No lymphadenopathy CARDIAC: RRR, no murmurs, rubs, gallops RESPIRATORY:  Clear to auscultation without rales, wheezing or rhonchi  ABDOMEN: Soft, non-tender, non-distended MUSCULOSKELETAL:  No edema; No deformity  SKIN: Warm and dry NEUROLOGIC:  Alert and oriented x 3 PSYCHIATRIC:  Normal affect   ASSESSMENT:    1. Coronary artery disease involving native coronary artery of native heart without angina pectoris   2. Dyspnea on exertion   3. Screening for hyperlipidemia    PLAN:    In order of problems listed above:  CAD, coronary artery calcifications noted in the distal left main/proximal LAD on chest CT.  She is a former smoker, patient complains of chest pain.  Has some musculoskeletal pain although chest discomfort sometimes occurs with exertion.  Obtain fasting lipid profile.  Start aspirin 81 mg daily, dose Lipitor/statin based on cholesterol levels.  Get echocardiogram, get Lexiscan Myoview to evaluate presence of ischemia. Dyspnea on exertion, get echo and Lexiscan Myoview as above.  Could be pulmonary in etiology with moderate centrilobular emphysema noted on chest CT.  Follow-up after echo Lexiscan Myoview.   Shared Decision Making/Informed Consent The risks [chest pain, shortness of breath, cardiac arrhythmias, dizziness, blood pressure fluctuations, myocardial infarction, stroke/transient ischemic attack, nausea, vomiting, allergic reaction, radiation exposure, metallic taste sensation and life-threatening complications (estimated to be 1 in 10,000)], benefits (risk stratification, diagnosing coronary artery disease, treatment guidance) and alternatives of a nuclear stress test were discussed in detail with Ms. Levels and she agrees to proceed.     Medication Adjustments/Labs and Tests Ordered: Current medicines are reviewed at length with the patient today.  Concerns regarding medicines are outlined above.  Orders Placed This Encounter  Procedures   NM Myocar Multi W/Spect W/Wall Motion / EF   Lipid panel  EKG 12-Lead   ECHOCARDIOGRAM COMPLETE    Meds ordered this encounter  Medications   aspirin EC 81 MG tablet    Sig: Take 1 tablet (81 mg total) by mouth daily. Swallow whole.    Dispense:  90 tablet    Refill:  3     Patient Instructions  Medication Instructions:   Your physician has recommended you make the following change in your medication:    START taking Aspirin 81 MG once a day.  *If you need a refill on your cardiac medications before your next appointment, please call your pharmacy*   Lab Work:  Your physician recommends that you return for a FASTING lipid profile: at your earliest [place] will need to be fasting. Please do not have anything to eat or drink after midnight the morning you have the lab work. You may only have water or black coffee with no cream or sugar.   - Please go to the [facility]. You will check in at the front desk to the right as you walk into the atrium. Valet Parking is offered if needed. - No appointment needed. You may go any day between 7 am and 6 pm.    Testing/Procedures:   Your physician has requested that you have an echocardiogram. Echocardiography is a painless test that uses sound waves to create images of your heart. It provides your doctor with information about the size and shape of your heart and how well your heart's chambers and valves are working. This procedure takes approximately one hour. There are no restrictions for this procedure.  [place]   Your caregiver has ordered a Stress Test with nuclear imaging. The purpose of this test is to evaluate the blood supply to your heart muscle. This procedure is referred to as a  "Non-Invasive Stress Test." This is because other than having an IV started in your vein, nothing is inserted or "invades" your body. Cardiac stress tests are done to find areas of poor blood flow to the heart by determining the extent of coronary artery disease (CAD). Some patients exercise on a treadmill, which naturally increases the blood flow to your heart, while others who are  unable to walk on a treadmill due to physical limitations have a pharmacologic/chemical stress agent called Lexiscan . This medicine will mimic walking on a treadmill by temporarily increasing your coronary blood flow.      PLEASE REPORT TO [facility] MEDICAL MALL ENTRANCE   THE VOLUNTEERS AT THE FIRST DESK WILL DIRECT YOU WHERE TO GO   *Please note: these test may take anywhere between 2-4 hours to complete    Date of Procedure:_____________________________________   Arrival Time for Procedure:______________________________    PLEASE NOTIFY THE OFFICE AT LEAST 24 HOURS IN ADVANCE IF YOU ARE UNABLE TO KEEP YOUR APPOINTMENT.  [place] 24 HOURS IN ADVANCE IF YOU ARE UNABLE TO KEEP YOUR APPOINTMENT. [phone]   How to prepare for your Myoview test:    1. Do not eat or drink after midnight  2. No caffeine for 24 hours prior to test  3. No smoking 24 hours prior to test.  4. Unless instructed otherwise, Take your medication with a small sips of water.    5.         Ladies, please do not wear dresses. Skirts or pants are appropriate. Please wear a short sleeve shirt.  6. No perfume, cologne or lotion.  7. Wear comfortable walking shoes. No heels!     Follow-Up: At [facility], you and your health needs are our priority.  As part of our continuing mission to provide you with exceptional heart care, we have created designated Provider Care Teams.  These Care Teams include your primary Cardiologist (physician) and Advanced Practice Providers (APPs -  Physician  Assistants and Nurse Practitioners) who all work together to provide you with the care you need, when you need it.  We recommend signing up for the patient portal called "MyChart".  Sign up information is provided on this After Visit Summary.  MyChart is used to connect with patients for Virtual Visits (Telemedicine).  Patients are able to view lab/test results, encounter notes, upcoming appointments, etc.  Non-urgent messages can be sent to your provider as well.   To learn more about what you can do with MyChart, go to [email or web address].    Your next appointment:   8 week(s)  The format for your next appointment:   In Person  Provider:   Kate Sable, MD   Other Instructions    Signed, Kate Sable, MD  10/19/2020 5:06 PM    [place]

## 2020-10-19 NOTE — Patient Instructions (Signed)
Medication Instructions:   Your physician has recommended you make the following change in your medication:    START taking Aspirin 81 MG once a day.  *If you need a refill on your cardiac medications before your next appointment, please call your pharmacy*   Lab Work:  Your physician recommends that you return for a FASTING lipid profile: at your earliest [place] will need to be fasting. Please do not have anything to eat or drink after midnight the morning you have the lab work. You may only have water or black coffee with no cream or sugar.   - Please go to the [facility]. You will check in at the front desk to the right as you walk into the atrium. Valet Parking is offered if needed. - No appointment needed. You may go any day between 7 am and 6 pm.    Testing/Procedures:   Your physician has requested that you have an echocardiogram. Echocardiography is a painless test that uses sound waves to create images of your heart. It provides your doctor with information about the size and shape of your heart and how well your heart's chambers and valves are working. This procedure takes approximately one hour. There are no restrictions for this procedure.  [place]   Your caregiver has ordered a Stress Test with nuclear imaging. The purpose of this test is to evaluate the blood supply to your heart muscle. This procedure is referred to as a "Non-Invasive Stress Test." This is because other than having an IV started in your vein, nothing is inserted or "invades" your body. Cardiac stress tests are done to find areas of poor blood flow to the heart by determining the extent of coronary artery disease (CAD). Some patients exercise on a treadmill, which naturally increases the blood flow to your heart, while others who are  unable to walk on a treadmill due to physical limitations have a pharmacologic/chemical stress agent called Lexiscan . This medicine will mimic walking on  a treadmill by temporarily increasing your coronary blood flow.      PLEASE REPORT TO [facility] MEDICAL MALL ENTRANCE   THE VOLUNTEERS AT THE FIRST DESK WILL DIRECT YOU WHERE TO GO   *Please note: these test may take anywhere between 2-4 hours to complete    Date of Procedure:_____________________________________   Arrival Time for Procedure:______________________________    PLEASE NOTIFY THE OFFICE AT LEAST 24 HOURS IN ADVANCE IF YOU ARE UNABLE TO KEEP YOUR APPOINTMENT.  [place] 24 HOURS IN ADVANCE IF YOU ARE UNABLE TO KEEP YOUR APPOINTMENT. [phone]   How to prepare for your Myoview test:    1. Do not eat or drink after midnight  2. No caffeine for 24 hours prior to test  3. No smoking 24 hours prior to test.  4. Unless instructed otherwise, Take your medication with a small sips of water.    5.         Ladies, please do not wear dresses. Skirts or pants are appropriate. Please wear a short sleeve shirt.  6. No perfume, cologne or lotion.  7. Wear comfortable walking shoes. No heels!     Follow-Up: At [facility], you and your health needs are our priority.  As part of our continuing mission to provide you with exceptional heart care, we have created designated Provider Care Teams.  These Care Teams include your primary Cardiologist (physician) and Advanced Practice  Providers (APPs -  Physician Assistants and Nurse Practitioners) who all work together to provide you with the care you need, when you need it.  We recommend signing up for the patient portal called "MyChart".  Sign up information is provided on this After Visit Summary.  MyChart is used to connect with patients for Virtual Visits (Telemedicine).  Patients are able to view lab/test results, encounter notes, upcoming appointments, etc.  Non-urgent messages can be sent to your provider as well.   To learn more about what you can do with MyChart, go to  [email or web address].    Your next appointment:   8 week(s)  The format for your next appointment:   In Person  Provider:   Kate Sable, MD   Other Instructions

## 2020-10-19 NOTE — Telephone Encounter (Signed)
Sent to [facility].

## 2020-10-20 ENCOUNTER — Other Ambulatory Visit: Payer: Self-pay

## 2020-10-20 DIAGNOSIS — G2581 Restless legs syndrome: Secondary | ICD-10-CM

## 2020-10-20 MED ORDER — ROPINIROLE HCL 0.25 MG PO TABS: ORAL_TABLET | ORAL | 1 refills | Status: DC

## 2020-10-26 ENCOUNTER — Ambulatory Visit: Payer: Medicare HMO | Admitting: Physical Therapy

## 2020-10-27 ENCOUNTER — Ambulatory Visit
Admission: RE | Admit: 2020-10-27 | Discharge: 2020-10-27 | Disposition: A | Discharge status: Home / Self Care | Payer: Medicare HMO | Source: Ambulatory Visit | Attending: Cardiology | Admitting: Cardiology

## 2020-10-27 ENCOUNTER — Ambulatory Visit: Payer: Medicare HMO | Admitting: Physical Therapy

## 2020-10-27 ENCOUNTER — Other Ambulatory Visit: Payer: Self-pay

## 2020-10-27 DIAGNOSIS — R0600 Dyspnea, unspecified: Secondary | ICD-10-CM | POA: Insufficient documentation

## 2020-10-27 DIAGNOSIS — R0609 Other forms of dyspnea: Secondary | ICD-10-CM

## 2020-10-27 LAB — NM MYOCAR MULTI W/SPECT W/WALL MOTION / EF
LV dias vol: 30 mL (ref 46–106)
LV sys vol: 8 mL
Peak HR: 109 {beats}/min
Percent HR: 71 %
Rest HR: 100 {beats}/min
TID: 0.75

## 2020-10-27 MED ORDER — TECHNETIUM TC 99M TETROFOSMIN IV KIT
28.8300 | PACK | Freq: Once | INTRAVENOUS | Status: AC | PRN
  Administered 2020-10-27: 28.83 via INTRAVENOUS

## 2020-10-27 MED ORDER — TECHNETIUM TC 99M TETROFOSMIN IV KIT
10.2900 | PACK | Freq: Once | INTRAVENOUS | Status: AC | PRN
  Administered 2020-10-27: 10.29 via INTRAVENOUS

## 2020-10-27 MED ORDER — REGADENOSON 0.4 MG/5ML IV SOLN
0.4000 mg | Freq: Once | INTRAVENOUS | Status: AC
  Administered 2020-10-27: 0.4 mg via INTRAVENOUS
  Filled 2020-10-27: qty 5

## 2020-10-28 ENCOUNTER — Ambulatory Visit: Payer: Medicare HMO | Admitting: Pulmonary Disease

## 2020-10-28 ENCOUNTER — Encounter: Payer: Self-pay | Admitting: Pulmonary Disease

## 2020-10-28 VITALS — BP 120/78 | HR 107 | Temp 98.6°F | Ht 61.0 in | Wt 142.5 lb

## 2020-10-28 DIAGNOSIS — R32 Unspecified urinary incontinence: Secondary | ICD-10-CM | POA: Diagnosis not present

## 2020-10-28 DIAGNOSIS — R053 Chronic cough: Secondary | ICD-10-CM | POA: Diagnosis not present

## 2020-10-28 DIAGNOSIS — G20 Parkinson's disease: Secondary | ICD-10-CM | POA: Diagnosis not present

## 2020-10-28 MED ORDER — LEVOFLOXACIN 500 MG PO TABS: 500.0000 mg | ORAL_TABLET | Freq: Every day | ORAL | 0 refills | Status: DC

## 2020-10-28 NOTE — Progress Notes (Signed)
[facility]  Chief Complaint  Patient presents with   Follow-up     Constitutional:  BP 120/78 (BP Location: Left Arm, Patient Position: Sitting, Cuff Size: Normal)   Pulse (!) 107   Temp 98.6 F (37 C) (Oral)   Ht 5\' 1"  (1.549 m)   Wt 142 lb 8 oz (64.6 kg)   SpO2 100%   BMI 26.93 kg/m   Past Medical History:  Anxiety, OA, Breast cancer, Vocal cord cancer, Depression, GERD, Hiatal hernia, Parkinson disease, Restless leg syndrome  Past Surgical History:  She  has a past surgical history that includes Skin cancer excision; Knee surgery (Right); Throat surgery (1998); Esophagus surgery; Tubal ligation; Robotic adrenalectomy (Right, 12/28/2016); Ventral hernia repair (N/A, 03/03/2017); Colonoscopy (2015); Breast lumpectomy with sentinel lymph node bx (Right, 12/08/2017); Portacath placement (Right, 01/17/2018); Breast lumpectomy (Right, 1997); Port-a-cath removal; Esophagogastroduodenoscopy (egd) with propofol (N/A, 05/24/2019); Breast reduction with mastopexy (Left, 06/10/2019); Liposuction with lipofilling (Right, 06/10/2019); Colonoscopy with propofol (N/A, 10/29/2019); Breast biopsy (Right, 1997); Breast biopsy (Right, 11/15/2017); and Breast biopsy (Right, 07/20/2020).  Brief Summary:  Denise Watts is a 65 y.o. female former smoker with chronic cough.      Subjective:   Sputum cultures and PFT not completed.  CT chest from April showed emphysema and stable nodules except for new LLL 8 mm GGO.  Still has cough with yellow sputum.  Breztri and albuterol help.  Feels feverish.  Prednisone hasn't helped.    [place] recently and injured her pelvis.  Since then she can't control her bladder especially when she goes from sitting to standing.  Physical Exam:   Appearance - well kempt   ENMT - no sinus tenderness, no oral exudate, no LAN, Mallampati 3 airway, no stridor, raspy voice  Respiratory - equal breath sounds bilaterally, no wheezing or  rales  CV - s1s2 regular rate and rhythm, no murmurs  Ext - no clubbing, no edema  Skin - no rashes  Psych - normal mood and affect    Pulmonary testing:    Chest Imaging:  CT chest 08/14/19 >> centrilobular emphysema, scarring RUL, 3 mm nodule RML, 3 mm nodule RLL, mild GGO 1.2 x 0.9 cm RLL, 4 mm nodule LUL, 1.1 x 1.2 GGO LUL, 1.2 x 1 cm nodule LLL CT chest 08/21/20 >> moderate centrilobular emphysema, stable nodules except new LLL 8 mm GGO  Social History:  She  reports that she quit smoking about 25 years ago. Her smoking use included cigarettes. She has a 45.00 pack-year smoking history. She has never used smokeless tobacco. She reports current alcohol use of about 4.0 standard drinks of alcohol per week. She reports that she does not use drugs.  Family History:  Her family history includes Cancer in her paternal aunt, paternal grandmother, and sister; Cancer (age of onset: 66) in her sister; Diabetes in her mother; Heart attack in her mother.     Assessment/Plan:   Emphysema with chronic cough with history of tobacco abuse and possible occupational exposure. - continue breztri with prn albuterol - will try to arrange for sputum culture and AFB again - might need to arrange for bronchoscopy with more direct airway sampling - defer PFT for now - give course of levaquin  Lung nodules. - follow up CT chest October 2022  Urinary incontinence. - after pelvic injury - will refer to urology  Parkinson disease, Restless leg syndrome. - followed by Dr. Jennings Books with Neurology at [facility]  History of breast cancer and vocal cord cancer. - followed by Dr. Randa Evens with [facility] in [place]  Time Spent Involved in Patient Care on Day of Examination:  23 minutes  Follow up:   There are no Patient Instructions on file for this visit.  Medication List:   Allergies as of 10/28/2020       Reactions   Sulfa Antibiotics Other (See Comments)    Mouth sore and raw        Medication List        Accurate as of October 28, 2020  3:36 PM. If you have any questions, ask your nurse or doctor.          aspirin EC 81 MG tablet Take 1 tablet (81 mg total) by mouth daily. Swallow whole.   benzonatate 200 MG capsule Commonly known as: TESSALON TAKE 1 CAPSULE BY MOUTH 3 TIMES DAILY ASNEEDED   Breztri Aerosphere 160-9-4.8 MCG/ACT Aero Generic drug: Budeson-Glycopyrrol-Formoterol Inhale 2 puffs into the lungs in the morning and at bedtime.   busPIRone 5 MG tablet Commonly known as: BUSPAR Take 1 tablet (5 mg total) by mouth 2 (two) times daily.   cyclobenzaprine 5 MG tablet Commonly known as: FLEXERIL Take 1 tablet (5 mg total) by mouth 3 (three) times daily as needed for muscle spasms.   DULoxetine 60 MG capsule Commonly known as: CYMBALTA Take by mouth.   fluticasone 50 MCG/ACT nasal spray Commonly known as: FLONASE Place 1 spray into both nostrils at bedtime.   gabapentin 300 MG capsule Commonly known as: NEURONTIN TAKE 1 CAPSULE BY MOUTH 3 TIMES A DAY   omeprazole 40 MG capsule Commonly known as: PRILOSEC TAKE 1 CAPSULE BY MOUTH EVERY DAY (MORNING)   ondansetron 4 MG tablet Commonly known as: Zofran Take 1 tablet (4 mg total) by mouth every 8 (eight) hours as needed for nausea or vomiting.   rOPINIRole 0.25 MG tablet Commonly known as: REQUIP Take 1-2 tablets by mouth at bedtime for restless legs.        Signature:  Chesley Mires, MD [place] Pager - [phone] 10/28/2020, 3:36 PM

## 2020-10-28 NOTE — Patient Instructions (Signed)
Levaquin 500 mg pill daily   Will arrange for sputum culture  Will arrange for referral to urology  Follow up in 3 weeks with Dr. Halford Chessman or Nurse Practitioner

## 2020-10-29 ENCOUNTER — Other Ambulatory Visit: Payer: Self-pay | Admitting: Family Medicine

## 2020-10-29 NOTE — Telephone Encounter (Signed)
Requested medication (s) are due for refill today: yes   Requested medication (s) are on the active medication list: yes   Last refill:  10/02/2020  Future visit scheduled:no   Notes to clinic:  this refill cannot be delegated   Requested Prescriptions  Pending Prescriptions Disp Refills   cyclobenzaprine (FLEXERIL) 5 MG tablet [Pharmacy Med Name: CYCLOBENZAPRINE HCL 5 MG TAB] 30 tablet 0    Sig: TAKE 1 TABLET BY MOUTH 3 TIMES DAILY AS NEEDED FOR MUSCLE SPASMS.      Not Delegated - Analgesics:  Muscle Relaxants Failed - 10/29/2020  9:50 AM      Failed - This refill cannot be delegated      Passed - Valid encounter within last 6 months    Recent Outpatient Visits           2 weeks ago Anxiety and depression   [place], PA-C   3 months ago Upper respiratory tract infection, unspecified type   [facility], [place], [place]   4 months ago [place], [place], [place]   4 months ago Cough   [place], [place]   6 months ago COPD exacerbation [facility])   [place], Driscilla Grammes       Future Appointments             In 3 weeks Martyn Ehrich, NP [facility]   In 1 month [place], Aaron Edelman, MD [facility], [place]

## 2020-11-03 ENCOUNTER — Encounter: Payer: Self-pay | Admitting: Urology

## 2020-11-03 ENCOUNTER — Ambulatory Visit: Payer: Medicare HMO | Admitting: Urology

## 2020-11-03 ENCOUNTER — Encounter: Payer: Self-pay | Admitting: Physical Therapy

## 2020-11-03 ENCOUNTER — Other Ambulatory Visit: Payer: Self-pay

## 2020-11-03 ENCOUNTER — Ambulatory Visit: Payer: Medicare HMO | Attending: Plastic Surgery

## 2020-11-03 VITALS — BP 120/78 | HR 91 | Ht 61.0 in | Wt 142.0 lb

## 2020-11-03 DIAGNOSIS — N393 Stress incontinence (female) (male): Secondary | ICD-10-CM | POA: Diagnosis not present

## 2020-11-03 DIAGNOSIS — E278 Other specified disorders of adrenal gland: Secondary | ICD-10-CM

## 2020-11-03 DIAGNOSIS — R32 Unspecified urinary incontinence: Secondary | ICD-10-CM | POA: Diagnosis not present

## 2020-11-03 DIAGNOSIS — M546 Pain in thoracic spine: Secondary | ICD-10-CM | POA: Insufficient documentation

## 2020-11-03 DIAGNOSIS — N3941 Urge incontinence: Secondary | ICD-10-CM

## 2020-11-03 LAB — BLADDER SCAN AMB NON-IMAGING: Scan Result: 42

## 2020-11-03 MED ORDER — GEMTESA 75 MG PO TABS: 75.0000 mg | ORAL_TABLET | Freq: Every day | ORAL | 11 refills | Status: DC

## 2020-11-03 NOTE — Therapy (Signed)
[place] [facility] [address], [place], [address] Phone: [phone]   Fax:  [phone]  Physical Therapy Treatment Reassessment   Patient Details  Name: Denise Watts MRN: [id number] Date of Birth: 1955-10-22 Referring Provider (PT): Sharmaine Base, DO   Encounter Date: 11/03/2020   PT End of Session - 11/03/20 1051     Visit Number 6    Number of Visits 17    Date for PT Re-Evaluation 10/20/20    Authorization Type [employer] Medicare HMO (VL TBD; $20 copay)    Authorization Time Period 09/22/20-11/17/20    PT Start Time 0952    PT Stop Time 1030    PT Time Calculation (min) 38 min    Activity Tolerance Patient tolerated treatment well    Behavior During Therapy [facility] for tasks assessed/performed             Past Medical History:  Diagnosis Date   Anxiety    Arthritis    KNEE RIGHT   Breast cancer ([place]) 1997   right breast lumpectomy with radiation   Breast cancer ([place]) 11/15/2017   INVASIVE MAMMARY CARCINOMA/ triple negative   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   Cough 03/02/2017   INTERMTTENT DRY COUGH   Depression    GERD (gastroesophageal reflux disease)    History of hiatal hernia    Personal history of chemotherapy    Personal history of radiation therapy    Skin cancer    Throat cancer ([place]) 1998   radiation    Past Surgical History:  Procedure Laterality Date   BREAST BIOPSY Right 1997   positive   BREAST BIOPSY Right 11/15/2017   INVASIVE MAMMARY CARCINOMA triple negative   BREAST BIOPSY Right 07/20/2020   [place] bx, Q marker, path pending   BREAST LUMPECTOMY Right 1997   2019 also   BREAST LUMPECTOMY WITH SENTINEL LYMPH NODE BIOPSY Right 12/08/2017   Procedure: BREAST LUMPECTOMY WITH SENTINEL LYMPH NODE BX;  Surgeon: Robert Bellow, MD;  Location: [facility] ORS;  Service: General;  Laterality: Right;   BREAST REDUCTION WITH MASTOPEXY Left 06/10/2019   Procedure: Left breast mastopexy  reduction for symmetry;  Surgeon: Wallace Going, DO;  Location: [place];  Service: Plastics;  Laterality: Left;  total 2.5 hours   COLONOSCOPY  2015   COLONOSCOPY WITH PROPOFOL N/A 10/29/2019   Procedure: COLONOSCOPY WITH PROPOFOL;  Surgeon: Lucilla Lame, MD;  Location: [facility] ENDOSCOPY;  Service: Endoscopy;  Laterality: N/A;   ESOPHAGOGASTRODUODENOSCOPY (EGD) WITH PROPOFOL N/A 05/24/2019   Procedure: ESOPHAGOGASTRODUODENOSCOPY (EGD) WITH PROPOFOL;  Surgeon: Lucilla Lame, MD;  Location: [facility] ENDOSCOPY;  Service: Endoscopy;  Laterality: N/A;   ESOPHAGUS SURGERY     KNEE SURGERY Right    LIPOSUCTION WITH LIPOFILLING Right 06/10/2019   Procedure: right breast scar and capsule release with fat grafting;  Surgeon: Wallace Going, DO;  Location: [place];  Service: Plastics;  Laterality: Right;   PORT-A-CATH REMOVAL     PORTACATH PLACEMENT Right 01/17/2018   Procedure: INSERTION PORT-A-CATH- RIGHT;  Surgeon: Robert Bellow, MD;  Location: [facility];  Service: General;  Laterality: Right;   ROBOTIC ADRENALECTOMY Right 12/28/2016   Procedure: ROBOTIC ADRENALECTOMY;  Surgeon: Hollice Espy, MD;  Location: [facility]S;  Service: Urology;  Laterality: Right;   SKIN CANCER EXCISION     THROAT SURGERY  1998   throat cancer    TUBAL LIGATION     VENTRAL HERNIA  REPAIR N/A 03/03/2017   Procedure: HERNIA REPAIR VENTRAL ADULT;  Surgeon: Clayburn Pert, MD;  Location: [facility] ORS;  Service: General;  Laterality: N/A;    There were no vitals filed for this visit.   Subjective Assessment - 11/03/20 0958     Subjective Patient reports missing the last few PT appointments due to sickness. She is feeling better today other than feeling a little weak. She denies having any loss of balance or falling.    Pertinent History Denise Watts is a 49yoF who is referred to OPPT sports rehab by plastic surgery after continued anterior bilateral thoracic. PMH: COPD, BrCA. Pt reports 2  bouts of BrCA 1997, 2019, both of which involving the right breast, both surgically addressed, both associated with back Watts and anterior trunk Watts. Pt also reports a fall in 2016 in which she sustained multiple rip fractures.    How long can you sit comfortably? not limited    How long can you stand comfortably? not limited    How long can you walk comfortably? not limited            Therapeutic Exercise   Nu step Level 2 x 5 min UE only SPM >40  Towel thoracic snags with towel Level T5-T8  T-Spine Extension over Towel roll seated Extension 2  x 12 reps level T5-T8  Theraband Row (RED) 2 x 12 reps   issued as HEP as follows (5x/week cues to perform scap squeeze and pull elbows to back pockets) issued as HEP        Ther-Ex PT reviewed the following HEP with patient with patient able to demonstrate a set of the following with min cuing for correction needed. PT educated patient on parameters of therex (how/when to inc/decrease intensity, frequency, rep/set range, stretch hold time, and purpose of therex) with verbalized understanding.                     PT Education - 11/03/20 1144     Education Details Pt educated in updated HEP and therex form/technique    Person(s) Educated Patient    Methods Explanation;Demonstration    Comprehension Verbalized understanding;Returned demonstration                  PT Short Term Goals - 11/03/20 1604       PT SHORT TERM GOAL #1   Title After 4 weeks pt to increase FOTO score by >5 points to suggestion improved pt perception of functional ability.    Time 4    Period Weeks    Status On-going    Target Date 10/20/20      PT SHORT TERM GOAL #2   Title After 4 weeks pt to demonstrate tolerance of seated thoracic A/ROM flexion, extension, and rotation without exacerbation of Watts.    Baseline Watts with active extension at eval, Watts with OP in rotation and extension at eval.    Time 4    Period Weeks     Status On-going    Target Date 10/20/20               PT Long Term Goals - 11/03/20 1000       PT LONG TERM GOAL #1   Title After 8 weeks pt to demonstrate 4+/5 periscapular strength in Y and T position for heavy household ADLs    Baseline 10/02/20 3 bilat Y; 3+ bilat T; 7/5: 3 bilat Y; 3+ bilat T    Time 8  Period Weeks    Status On-going    Target Date 11/17/20      PT LONG TERM GOAL #2   Title After 8 weeks pt will demonstrate ability to lift and carry 10lb in each hand ~278f without exacerbation of Watts to facilitate moving groceries into the house.    Baseline Patient able to perform task but exacerbated Watts 1-2/10 on NPRS    Time 8    Period Weeks    Status Partially Met    Target Date 11/17/20      PT LONG TERM GOAL #3   Title After 8 weeks pt will reports mild to no discomfort with palpation to the anterior and anterolateral caudal thoracic cage.    Baseline significant tenderness/Watts at eval    Time 8    Period Weeks    Status On-going    Target Date 11/17/20      PT LONG TERM GOAL #4   Title Patient will increase FOTO score to 46 to demonstrate predicted increase in functional mobility to complete ADLs    Baseline 10/02/20 55    Time 8    Period Weeks    Status On-going                   Plan - 11/03/20 1135     Clinical Impression Statement Pt tolerated session well with no increased Watts at end of session. Reassessment performed this date, results demonstrating progress made toward both ST and LT goals. Pt still working toward correction of defiicts in strength and activity tolerance.  Prone Ts and Y's and 10# carry test were assessed this day. PT also updated HEP with patient requiring multimodal cueing to ensure form. Pt demonstrated good motivation throughout session. Pt will continue to benefit from skilled therapy to achieve set goals    Personal Factors and Comorbidities Age;Fitness;Education;Time since onset of injury/illness/exacerbation     Examination-Activity Limitations Lift;Transfers;Bed Mobility;Sleep;Carry    Examination-Participation Restrictions Laundry;Yard Work;Shop;Community Activity    Rehab Potential Good    PT Frequency 2x / week    PT Duration 8 weeks    PT Treatment/Interventions ADLs/Self Care Home Management;Electrical Stimulation;Moist Heat;DME Instruction;Gait training;Functional mobility training;Therapeutic activities;Therapeutic exercise;Manual techniques;Dry needling;Patient/family education    PT Next Visit Plan Continue gentle periscapular strengthening, gentle A/ROM of T-spine. (FOTO)    PT Home Exercise Plan Posterior shoulder circles, BUE overhead reach "in the tube"    Consulted and Agree with Plan of Care Patient             Patient will benefit from skilled therapeutic intervention in order to improve the following deficits and impairments:  Cardiopulmonary status limiting activity, Decreased activity tolerance, Decreased mobility, Hypomobility, Decreased range of motion, Increased muscle spasms, Postural dysfunction, Improper body mechanics  Visit Diagnosis: Watts in thoracic spine     Problem List Patient Active Problem List   Diagnosis Date Noted   Situational stress 10/15/2020   Anxiety and depression 10/15/2020   Chronic cough 08/31/2020   Encounter for screening colonoscopy    Polyp of transverse colon    Dysphagia    Stricture and stenosis of esophagus    Cervical spondylosis 03/18/2019   [place]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   Need for prophylactic vaccination and inoculation against influenza 01/25/2019   Postoperative breast asymmetry 01/04/2019   Status post partial mastectomy 01/04/2019   Vitamin D deficiency 12/12/2018   Bursitis of left shoulder 11/21/2018   Rotator cuff tendinitis, left 11/21/2018   Genetic testing 02/01/2018  Skin cancer 01/18/2018   Goals of care, counseling/discussion 01/12/2018   B12 deficiency 12/28/2017   Malignant neoplasm of  lower-outer quadrant of right breast of female, estrogen receptor negative ([place]) 11/24/2017   Chronic abdominal Watts (Primary Area of Watts)  04/03/2017   Chronic low back Watts (Secondary Area of Watts) (B) (R>L) 04/03/2017   Chronic knee Watts [facility] Area of Watts) (R) 04/03/2017   Chronic Watts syndrome 04/03/2017   Disorder of bone, unspecified 04/03/2017   Other specified health status 04/03/2017   Other long term (current) drug therapy 04/03/2017   Long term current use of opiate analgesic 04/03/2017   Chronic sacroiliac joint Watts 04/03/2017   S/P repair of ventral hernia 03/03/2017   Incisional hernia, without obstruction or gangrene 02/13/2017   Right adrenal mass ([place]) 12/28/2016   RIGHT Adrenal Mass 12/16/2016   Fatigue 02/17/2016   Other fatigue 02/17/2016   Multiple lung nodules on CT 02/17/2016   History of right breast cancer 02/17/2016   Peripheral neuropathy 02/17/2016   Closed fracture of a rib 02/02/2015   History of fundoplication 47/65/4650   History of throat cancer 01/15/2015   Acid reflux 01/15/2015   Gonalgia 01/15/2015   Closed fracture of pubis ([place]) 01/15/2015   Pelvic fracture ([place]) 01/12/2015   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, [place], [address] Phone: [phone]   Fax:  [phone]  Name: Denise Watts MRN: [id number] Date of Birth: 04/24/1956

## 2020-11-03 NOTE — Progress Notes (Signed)
11/03/2020 5:10 PM   Denise Watts 1955/12/18 [id number]  Referring provider: Chesley Mires, MD [place] [address],  [place] [address]  Chief Complaint  Patient presents with   Urinary Incontinence    HPI: 65 year old female.  Today for further evaluation of urinary incontinence.  Notably, she is well-known to me dating back to 2018 at which time she underwent right robotic adrenalectomy for 5 cm adrenal mass.  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  On serial imaging, there is been no focal or local recurrence of this.  Today, her primary complaint is urinary incontinence.  History about 3 weeks ago.  She reports a fall down a few stairs resulting in splaying of her hips and fall onto her tailbone.  She denies any specific straddle injury to the perineum.  She do not have any evaluation or imaging.  She has no other symptoms related to this fall other than urinary symptoms.  She denies any fecal incontinence or bowel issues, no lower extremity weakness or numbness, or any other sequelae related to this fall.  Since the fall, she has developed severe what sounds like urge incontinence.  She will have the urge to urinate not be able to get to the bathroom in time.  She is now wearing diapers.  This happened several times a day.  She denies any of this prior to the fall.  She did have some urgency frequency issue but this was not severe.  She also has a personal history of mild stress urinary incontinence was chronic.  No vaginal symptoms.    She was having issues with recurrent urinary tract infections but none recently.  She is on antibiotics currently due to a lung issue.  Urinalysis today is unremarkable.  Adequate bladder emptying, PVR as below.   PMH: Past Medical History:  Diagnosis Date   Anxiety    Arthritis    KNEE RIGHT   Breast cancer () 1997   right breast lumpectomy with  radiation   Breast cancer ([place]) 11/15/2017   INVASIVE MAMMARY CARCINOMA/ triple negative   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[facility]) 11/10/2014   Cough 03/02/2017   INTERMTTENT DRY COUGH   Depression    GERD (gastroesophageal reflux disease)    History of hiatal hernia    Personal history of chemotherapy    Personal history of radiation therapy    Skin cancer    Throat cancer ([place]) 1998   radiation    Surgical History: Past Surgical History:  Procedure Laterality Date   BREAST BIOPSY Right 1997   positive   BREAST BIOPSY Right 11/15/2017   INVASIVE MAMMARY CARCINOMA triple negative   BREAST BIOPSY Right 07/20/2020   [place] bx, Q marker, path pending   BREAST LUMPECTOMY Right 1997   2019 also   BREAST LUMPECTOMY WITH SENTINEL LYMPH NODE BIOPSY Right 12/08/2017   Procedure: BREAST LUMPECTOMY WITH SENTINEL LYMPH NODE BX;  Surgeon: Robert Bellow, MD;  Location: [facility];  Service: General;  Laterality: Right;   BREAST REDUCTION WITH MASTOPEXY Left 06/10/2019   Procedure: Left breast mastopexy reduction for symmetry;  Surgeon: Wallace Going, DO;  Location: [place];  Service: Plastics;  Laterality: Left;  total 2.5 hours   COLONOSCOPY  2015   COLONOSCOPY WITH PROPOFOL N/A 10/29/2019   Procedure: COLONOSCOPY WITH PROPOFOL;  Surgeon: Lucilla Lame, MD;  Location[facility] ENDOSCOPY;  Service: Endoscopy;  Laterality: N/A;  ESOPHAGOGASTRODUODENOSCOPY (EGD) WITH PROPOFOL N/A 05/24/2019   Procedure: ESOPHAGOGASTRODUODENOSCOPY (EGD) WITH PROPOFOL;  Surgeon: Lucilla Lame, MD;  Location: [facility] ENDOSCOPY;  Service: Endoscopy;  Laterality: N/A;   ESOPHAGUS SURGERY     KNEE SURGERY Right    LIPOSUCTION WITH LIPOFILLING Right 06/10/2019   Procedure: right breast scar and capsule release with fat grafting;  Surgeon: Wallace Going, DO;  Location: [place];  Service: Plastics;  Laterality: Right;   PORT-A-CATH REMOVAL     PORTACATH PLACEMENT Right 01/17/2018   Procedure:  INSERTION PORT-A-CATH- RIGHT;  Surgeon: Robert Bellow, MD;  Location: [facility];  Service: General;  Laterality: Right;   ROBOTIC ADRENALECTOMY Right 12/28/2016   Procedure: ROBOTIC ADRENALECTOMY;  Surgeon: Hollice Espy, MD;  Location: [facility];  Service: Urology;  Laterality: Right;   SKIN CANCER EXCISION     THROAT SURGERY  1998   throat cancer    TUBAL LIGATION     VENTRAL HERNIA REPAIR N/A 03/03/2017   Procedure: HERNIA REPAIR VENTRAL ADULT;  Surgeon: Clayburn Pert, MD;  Location: [facility]S;  Service: General;  Laterality: N/A;    Home Medications:  Allergies as of 11/03/2020       Reactions   Sulfa Antibiotics Other (See Comments)   Mouth sore and raw        Medication List        Accurate as of November 03, 2020  5:10 PM. If you have any questions, ask your nurse or doctor.          aspirin EC 81 MG tablet Take 1 tablet (81 mg total) by mouth daily. Swallow whole.   benzonatate 200 MG capsule Commonly known as: TESSALON TAKE 1 CAPSULE BY MOUTH 3 TIMES DAILY ASNEEDED   Breztri Aerosphere 160-9-4.8 MCG/ACT Aero Generic drug: Budeson-Glycopyrrol-Formoterol Inhale 2 puffs into the lungs in the morning and at bedtime.   busPIRone 5 MG tablet Commonly known as: BUSPAR Take 1 tablet (5 mg total) by mouth 2 (two) times daily.   cyclobenzaprine 5 MG tablet Commonly known as: FLEXERIL TAKE 1 TABLET BY MOUTH 3 TIMES DAILY AS NEEDED FOR MUSCLE SPASMS.   DULoxetine 60 MG capsule Commonly known as: CYMBALTA Take by mouth.   fluticasone 50 MCG/ACT nasal spray Commonly known as: FLONASE Place 1 spray into both nostrils at bedtime.   gabapentin 300 MG capsule Commonly known as: NEURONTIN TAKE 1 CAPSULE BY MOUTH 3 TIMES A DAY   Gemtesa 75 MG Tabs Generic drug: Vibegron Take 75 mg by mouth daily. Started by: Hollice Espy, MD   levofloxacin 500 MG tablet Commonly known as: LEVAQUIN Take 1 tablet (500 mg total) by mouth daily.   omeprazole 40 MG  capsule Commonly known as: PRILOSEC TAKE 1 CAPSULE BY MOUTH EVERY DAY (MORNING)   ondansetron 4 MG tablet Commonly known as: Zofran Take 1 tablet (4 mg total) by mouth every 8 (eight) hours as needed for nausea or vomiting.   rOPINIRole 0.25 MG tablet Commonly known as: REQUIP Take 1-2 tablets by mouth at bedtime for restless legs.        Allergies:  Allergies  Allergen Reactions   Sulfa Antibiotics Other (See Comments)    Mouth sore and raw    Family History: Family History  Problem Relation Age of Onset   Diabetes Mother    Heart attack Mother    Cancer Sister        cancer of eyes, spread to lung, other places. died at 17   Cancer  Sister 34       unknown primary, spread to bone, brain died in 29's   Cancer Paternal Aunt        unk type   Cancer Paternal Grandmother        type unk   Prostate cancer Neg Hx    Kidney cancer Neg Hx    Bladder Cancer Neg Hx     Social History:  reports that she quit smoking about 25 years ago. Her smoking use included cigarettes. She has a 45.00 pack-year smoking history. She has never used smokeless tobacco. She reports current alcohol use of about 4.0 standard drinks of alcohol per week. She reports that she does not use drugs.   Physical Exam: BP 120/78   Pulse 91   Ht 5\' 1"  (1.549 m)   Wt 142 lb (64.4 kg)   BMI 26.83 kg/m   Constitutional:  Alert and oriented, No acute distress. HEENT: [place] AT, moist mucus membranes.  Trachea midline, no masses. Cardiovascular: No clubbing, cyanosis, or edema. Respiratory: Normal respiratory effort, no increased work of breathing. Skin: No rashes, bruises or suspicious lesions. Neurologic: Grossly intact, no focal deficits, moving all 4 extremities. Psychiatric: Normal mood and affect.  Laboratory Data: Lab Results  Component Value Date   WBC 8.3 10/15/2020   HGB 13.6 10/15/2020   HCT 40.9 10/15/2020   MCV 98 (H) 10/15/2020   PLT 247 10/15/2020    Lab Results  Component Value  Date   CREATININE 0.80 10/15/2020    Urinalysis Urinalysis today is negative, no WBCs or RBCs.  Pertinent Imaging: Results for orders placed or performed in visit on 11/03/20  Bladder Scan (Post Void Residual) in office  Result Value Ref Range   Scan Result 42      Assessment & Plan:    1. Urge incontinence of urine New onset urge incontinence after fall  Based on absence of any other symptoms following this mild trauma, do not suspect any underlying neurological or mechanical issues contributing to her new onset urge incontinence  Urinalysis today is negative without infection is contributing factor  She does have a personal history of neuropathy and may have some component of OAB  We will try her on medication for symptomatic relief, samples of Gemtesa 75 mg were given today we discussed possible side effects.  If her symptoms fail to resolve, we will consider cystoscopy and other imaging to rule out any underlying structural issues.  She is agreeable this plan. - Urinalysis, Complete - Bladder Scan (Post Void Residual) in office  2. Urinary, incontinence, stress female We discussed pelvic floor exercises, more bothered by #1  3. Right adrenal mass [facility]) Personal history of right adrenal mass resection, most recent imaging reviewed and no evidence of recurrence  F/u 4 weeks recheck symptoms/PVR  Hollice Espy, MD  [facility] [address] [place], [place] [address] [phone]

## 2020-11-04 LAB — URINALYSIS, COMPLETE
Bilirubin, UA: NEGATIVE
Glucose, UA: NEGATIVE
Ketones, UA: NEGATIVE
Leukocytes,UA: NEGATIVE
Nitrite, UA: NEGATIVE
Protein,UA: NEGATIVE
RBC, UA: NEGATIVE
Specific Gravity, UA: 1.02 (ref 1.005–1.030)
Urobilinogen, Ur: 0.2 mg/dL (ref 0.2–1.0)
pH, UA: 5.5 (ref 5.0–7.5)

## 2020-11-04 LAB — MICROSCOPIC EXAMINATION: Bacteria, UA: NONE SEEN

## 2020-11-06 ENCOUNTER — Ambulatory Visit: Payer: Medicare HMO | Admitting: Physical Therapy

## 2020-11-06 ENCOUNTER — Encounter: Payer: Self-pay | Admitting: Physical Therapy

## 2020-11-06 DIAGNOSIS — M546 Pain in thoracic spine: Secondary | ICD-10-CM

## 2020-11-06 NOTE — Therapy (Signed)
[facility] [address], [place], [address] Phone: [phone]   Fax:  [phone]  Physical Therapy Treatment  Patient Details  Name: Denise Watts MRN: [id number] Date of Birth: [birth year] Referring Provider (PT): Sharmaine Base, DO   Encounter Date: 11/06/2020   PT End of Session - 11/06/20 1015     Visit Number 7    Number of Visits 17    Date for PT Re-Evaluation 10/20/20    Authorization Type [employer] HMO (VL TBD; $20 copay)    Authorization Time Period 09/22/20-11/17/20    PT Start Time 0936    PT Stop Time 1014    PT Time Calculation (min) 38 min    Activity Tolerance Patient tolerated treatment well    Behavior During Therapy [facility] for tasks assessed/performed             Past Medical History:  Diagnosis Date   Anxiety    Arthritis    KNEE RIGHT   Breast cancer ([place]) 1997   right breast lumpectomy with radiation   Breast cancer ([place]) 11/15/2017   INVASIVE MAMMARY CARCINOMA/ triple negative   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   Cough 03/02/2017   INTERMTTENT DRY COUGH   Depression    GERD (gastroesophageal reflux disease)    History of hiatal hernia    Personal history of chemotherapy    Personal history of radiation therapy    Skin cancer    Throat cancer ([place]) 1998   radiation    Past Surgical History:  Procedure Laterality Date   BREAST BIOPSY Right 1997   positive   BREAST BIOPSY Right 11/15/2017   INVASIVE MAMMARY CARCINOMA triple negative   BREAST BIOPSY Right 07/20/2020   [place] bx, Q marker, path pending   BREAST LUMPECTOMY Right 1997   2019 also   BREAST LUMPECTOMY WITH SENTINEL LYMPH NODE BIOPSY Right 12/08/2017   Procedure: BREAST LUMPECTOMY WITH SENTINEL LYMPH NODE BX;  Surgeon: Robert Bellow, MD;  Location: [facility];  Service: General;  Laterality: Right;   BREAST REDUCTION WITH MASTOPEXY Left 06/10/2019   Procedure: Left breast mastopexy reduction for  symmetry;  Surgeon: Wallace Going, DO;  Location: [place];  Service: Plastics;  Laterality: Left;  total 2.5 hours   COLONOSCOPY  2015   COLONOSCOPY WITH PROPOFOL N/A 10/29/2019   Procedure: COLONOSCOPY WITH PROPOFOL;  Surgeon: Lucilla Lame, MD;  Location: [facility];  Service: Endoscopy;  Laterality: N/A;   ESOPHAGOGASTRODUODENOSCOPY (EGD) WITH PROPOFOL N/A 05/24/2019   Procedure: ESOPHAGOGASTRODUODENOSCOPY (EGD) WITH PROPOFOL;  Surgeon: Lucilla Lame, MD;  Location: [facility] ENDOSCOPY;  Service: Endoscopy;  Laterality: N/A;   ESOPHAGUS SURGERY     KNEE SURGERY Right    LIPOSUCTION WITH LIPOFILLING Right 06/10/2019   Procedure: right breast scar and capsule release with fat grafting;  Surgeon: Wallace Going, DO;  Location: [place];  Service: Plastics;  Laterality: Right;   PORT-A-CATH REMOVAL     PORTACATH PLACEMENT Right 01/17/2018   Procedure: INSERTION PORT-A-CATH- RIGHT;  Surgeon: Robert Bellow, MD;  Location: [facility];  Service: General;  Laterality: Right;   ROBOTIC ADRENALECTOMY Right 12/28/2016   Procedure: ROBOTIC ADRENALECTOMY;  Surgeon: Hollice Espy, MD;  Location: [facility];  Service: Urology;  Laterality: Right;   SKIN CANCER EXCISION     THROAT SURGERY  1998   throat cancer    TUBAL LIGATION     VENTRAL HERNIA REPAIR N/A  03/03/2017   Procedure: HERNIA REPAIR VENTRAL ADULT;  Surgeon: Clayburn Pert, MD;  Location: [facility];  Service: General;  Laterality: N/A;    There were no vitals filed for this visit.   Subjective Assessment - 11/06/20 0939     Subjective Patient reports 2/10 pain in the her back. She reports feeling better than this vist than last.    Pertinent History Denise Watts is a 41yoF who is referred to OPPT sports rehab by plastic surgery after continued anterior bilateral thoracic. PMH: COPD, BrCA. Pt reports 2 bouts of BrCA 1997, 2019, both of which involving the right breast, both surgically addressed, both  associated with back pain and anterior trunk pain. Pt also reports a fall in 2016 in which she sustained multiple rip fractures.              Therex   Nu step Level 2 x 5 min UE only SPM >40   100 ft AMB carrying 5# db each hand with elbows flexed no reports of discomfort  Standing Is, Ys, Ts 2 x 10 reps 2# Dbs cues to thumb placement/directions   DB bench press 5# each hand 2 x 6 reps cueing to hold weights in middle  Lat pull over with PVC and ankle wt 1 x 10 reps 5#  1 x 10 reps 7.5# cues to slightly bend elbows and pull over head  Standing Tspine rotation against wall 1 x 10 reps L<>R cues to place hand behind head for comfort                          PT Education - 11/06/20 1026     Education Details therex form/technique    Person(s) Educated Patient    Methods Explanation;Demonstration    Comprehension Verbalized understanding;Returned demonstration              PT Short Term Goals - 11/03/20 1604       PT SHORT TERM GOAL #1   Title After 4 weeks pt to increase FOTO score by >5 points to suggestion improved pt perception of functional ability.    Time 4    Period Weeks    Status On-going    Target Date 10/20/20      PT SHORT TERM GOAL #2   Title After 4 weeks pt to demonstrate tolerance of seated thoracic A/ROM flexion, extension, and rotation without exacerbation of pain.    Baseline pain with active extension at eval, pain with OP in rotation and extension at eval.    Time 4    Period Weeks    Status On-going    Target Date 10/20/20               PT Long Term Goals - 11/03/20 1000       PT LONG TERM GOAL #1   Title After 8 weeks pt to demonstrate 4+/5 periscapular strength in Y and T position for heavy household ADLs    Baseline 10/02/20 3 bilat Y; 3+ bilat T; 7/5: 3 bilat Y; 3+ bilat T    Time 8    Period Weeks    Status On-going    Target Date 11/17/20      PT LONG TERM GOAL #2   Title After 8 weeks pt will  demonstrate ability to lift and carry 10lb in each hand ~216f without exacerbation of pain to facilitate moving groceries into the house.    Baseline Patient able to  perform task but exacerbated pain 1-2/10 on NPRS    Time 8    Period Weeks    Status Partially Met    Target Date 11/17/20      PT LONG TERM GOAL #3   Title After 8 weeks pt will reports mild to no discomfort with palpation to the anterior and anterolateral caudal thoracic cage.    Baseline significant tenderness/pain at eval    Time 8    Period Weeks    Status On-going    Target Date 11/17/20      PT LONG TERM GOAL #4   Title Patient will increase FOTO score to 46 to demonstrate predicted increase in functional mobility to complete ADLs    Baseline 10/02/20 55    Time 8    Period Weeks    Status On-going                   Plan - 11/06/20 1016     Clinical Impression Statement Patient tolerated session well evidenced by decrease in pain from 2/10 to 0/10 following therapeutic exercise. Pt continues to demonstrate decreased UE strength, decreased thoracic spine mobility, and decreased activity tolerance. Patient will continue to benefit from skilled PT and multimodal cueing to ensure safe and effective exercise progression.    Personal Factors and Comorbidities Age;Fitness;Education;Time since onset of injury/illness/exacerbation    Examination-Activity Limitations Lift;Transfers;Bed Mobility;Sleep;Carry    Examination-Participation Restrictions Laundry;Yard Work;Shop;Community Activity    PT Treatment/Interventions ADLs/Self Care Home Management;Electrical Stimulation;Moist Heat;DME Instruction;Gait training;Functional mobility training;Therapeutic activities;Therapeutic exercise;Manual techniques;Dry needling;Patient/family education    PT Next Visit Plan Continue gentle periscapular strengthening, gentle A/ROM of T-spine. (FOTO)    PT Home Exercise Plan Posterior shoulder circles, BUE overhead reach "in the  tube"    Consulted and Agree with Plan of Care Patient             Patient will benefit from skilled therapeutic intervention in order to improve the following deficits and impairments:  Cardiopulmonary status limiting activity, Decreased activity tolerance, Decreased mobility, Hypomobility, Decreased range of motion, Increased muscle spasms, Postural dysfunction, Improper body mechanics  Visit Diagnosis: Pain in thoracic spine     Problem List Patient Active Problem List   Diagnosis Date Noted   Situational stress 10/15/2020   Anxiety and depression 10/15/2020   Chronic cough 08/31/2020   Encounter for screening colonoscopy    Polyp of transverse colon    Dysphagia    Stricture and stenosis of esophagus    Cervical spondylosis 03/18/2019   [place]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   Need for prophylactic vaccination and inoculation against influenza 01/25/2019   Postoperative breast asymmetry 01/04/2019   Status post partial mastectomy 01/04/2019   Vitamin D deficiency 12/12/2018   Bursitis of left shoulder 11/21/2018   Rotator cuff tendinitis, left 11/21/2018   Genetic testing 02/01/2018   Skin cancer 01/18/2018   Goals of care, counseling/discussion 01/12/2018   B12 deficiency 12/28/2017   Malignant neoplasm of lower-outer quadrant of right breast of female, estrogen receptor negative ([place]) 11/24/2017   Chronic abdominal pain (Primary Area of Pain)  04/03/2017   Chronic low back pain (Secondary Area of Pain) (B) (R>L) 04/03/2017   Chronic knee pain (Tertiary Area of Pain) (R) 04/03/2017   Chronic pain syndrome 04/03/2017   Disorder of bone, unspecified 04/03/2017   Other specified health status 04/03/2017   Other long term (current) drug therapy 04/03/2017   Long term current use of opiate analgesic 04/03/2017   Chronic sacroiliac joint pain  04/03/2017   S/P repair of ventral hernia 03/03/2017   Incisional hernia, without obstruction or gangrene 02/13/2017   Right  adrenal mass ([place]) 12/28/2016   RIGHT Adrenal Mass 12/16/2016   Fatigue 02/17/2016   Other fatigue 02/17/2016   Multiple lung nodules on CT 02/17/2016   History of right breast cancer 02/17/2016   Peripheral neuropathy 02/17/2016   Closed fracture of a rib 02/02/2015   History of fundoplication 38/18/2993   History of throat cancer 01/15/2015   Acid reflux 01/15/2015   Gonalgia 01/15/2015   Closed fracture of pubis ([place]) 01/15/2015   Pelvic fracture ([place]) 01/12/2015   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, [place], [address] Phone: [phone]   Fax:  [phone]  Name: Denise Watts MRN: [id number] Date of Birth: [birth year]

## 2020-11-10 ENCOUNTER — Ambulatory Visit: Payer: Medicare HMO | Admitting: Physical Therapy

## 2020-11-10 DIAGNOSIS — M546 Pain in thoracic spine: Secondary | ICD-10-CM

## 2020-11-10 NOTE — Therapy (Signed)
[place] [address] [place], [address] Phone: [phone]   Fax:  [phone]  Physical Therapy Treatment  Patient Details  Name: Denise Watts MRN: [id number] Date of Birth: 05-Jun-1955 Referring Provider (PT): Sharmaine Base, DO   Encounter Date: 11/10/2020   PT End of Session - 11/10/20 0956     Visit Number 8    Number of Visits 17    Date for PT Re-Evaluation 11/17/20    Authorization Type [employer] Medicare HMO (VL TBD; $20 copay)    Authorization Time Period 09/22/20-11/17/20    PT Start Time 0949    PT Stop Time 1027    PT Time Calculation (min) 38 min    Activity Tolerance Patient tolerated treatment well    Behavior During Therapy [facility] for tasks assessed/performed             Past Medical History:  Diagnosis Date   Anxiety    Arthritis    KNEE RIGHT   Breast cancer ([place]) 1997   right breast lumpectomy with radiation   Breast cancer ([place]) 11/15/2017   INVASIVE MAMMARY CARCINOMA/ triple negative   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/10/2014   Cough 03/02/2017   INTERMTTENT DRY COUGH   Depression    GERD (gastroesophageal reflux disease)    History of hiatal hernia    Personal history of chemotherapy    Personal history of radiation therapy    Skin cancer    Throat cancer ([place]) 1998   radiation    Past Surgical History:  Procedure Laterality Date   BREAST BIOPSY Right 1997   positive   BREAST BIOPSY Right 11/15/2017   INVASIVE MAMMARY CARCINOMA triple negative   BREAST BIOPSY Right 07/20/2020   [place] bx, Q marker, path pending   BREAST LUMPECTOMY Right 1997   2019 also   BREAST LUMPECTOMY WITH SENTINEL LYMPH NODE BIOPSY Right 12/08/2017   Procedure: BREAST LUMPECTOMY WITH SENTINEL LYMPH NODE BX;  Surgeon: Robert Bellow, MD;  Location: [facility];  Service: General;  Laterality: Right;   BREAST REDUCTION WITH MASTOPEXY Left 06/10/2019   Procedure: Left breast mastopexy reduction for  symmetry;  Surgeon: Wallace Going, DO;  Location: [place];  Service: Plastics;  Laterality: Left;  total 2.5 hours   COLONOSCOPY  2015   COLONOSCOPY WITH PROPOFOL N/A 10/29/2019   Procedure: COLONOSCOPY WITH PROPOFOL;  Surgeon: Lucilla Lame, MD;  Location[facility] ENDOSCOPY;  Service: Endoscopy;  Laterality: N/A;   ESOPHAGOGASTRODUODENOSCOPY (EGD) WITH PROPOFOL N/A 05/24/2019   Procedure: ESOPHAGOGASTRODUODENOSCOPY (EGD) WITH PROPOFOL;  Surgeon: Lucilla Lame, MD;  Location: [facility] ENDOSCOPY;  Service: Endoscopy;  Laterality: N/A;   ESOPHAGUS SURGERY     KNEE SURGERY Right    LIPOSUCTION WITH LIPOFILLING Right 06/10/2019   Procedure: right breast scar and capsule release with fat grafting;  Surgeon: Wallace Going, DO;  Location: [place];  Service: Plastics;  Laterality: Right;   PORT-A-CATH REMOVAL     PORTACATH PLACEMENT Right 01/17/2018   Procedure: INSERTION PORT-A-CATH- RIGHT;  Surgeon: Robert Bellow, MD;  Location: [facility];  Service: General;  Laterality: Right;   ROBOTIC ADRENALECTOMY Right 12/28/2016   Procedure: ROBOTIC ADRENALECTOMY;  Surgeon: Hollice Espy, MD;  Location: [facility];  Service: Urology;  Laterality: Right;   SKIN CANCER EXCISION     THROAT SURGERY  1998   throat cancer    TUBAL LIGATION     VENTRAL HERNIA REPAIR N/A  03/03/2017   Procedure: HERNIA REPAIR VENTRAL ADULT;  Surgeon: Clayburn Pert, MD;  Location: [facility];  Service: General;  Laterality: N/A;    There were no vitals filed for this visit.   Subjective Assessment - 11/10/20 0949     Subjective Patient reports 2/10 pain in the her back. She reports her antibiotics are working and that she is feeling better. She also reports being sore about 24-48 hours following last session.    Pertinent History Denise Watts is a 57yoF who is referred to OPPT sports rehab by plastic surgery after continued anterior bilateral thoracic. PMH: COPD, BrCA. Pt reports 2 bouts of BrCA  1997, 2019, both of which involving the right breast, both surgically addressed, both associated with back pain and anterior trunk pain. Pt also reports a fall in 2016 in which she sustained multiple rip fractures.    How long can you sit comfortably? not limited    How long can you stand comfortably? not limited    How long can you walk comfortably? not limited               Therapeutic Exercise     Nu step Level 2 x 5 min UE only SPM >40   100 ft AMB carrying 6# db each hand with elbows bent no reports of discomfort   Standing Is, Ys, Ts 2 x 10 reps 2# Dbs cues for thumb placement/directions  Arm circles BWD/FWD 2 x 20 sec to stretch    DB bench press 5# each hand 2 x 8 reps cueing to hold weights in middle   Lat pull over with PVC and ankle wt 1 x 10 reps 5#  1 x 10 reps 7.5# cues to slightly bend elbows and pull over head    Serratus punches 2# DB 2 x 10 reps cues to protract shoulder blade and keep arm straight and punch the ceiling   Seated thoracic extension over half foam roller 2 x 30sec   Standing Row GTB 2 x 10 reps cues pull straight back and squeeze shoulder blades together                        PT Education - 11/10/20 1030     Education Details therex form/technique    Person(s) Educated Patient    Methods Explanation;Demonstration    Comprehension Verbalized understanding;Returned demonstration              PT Short Term Goals - 11/03/20 1604       PT SHORT TERM GOAL #1   Title After 4 weeks pt to increase FOTO score by >5 points to suggestion improved pt perception of functional ability.    Time 4    Period Weeks    Status On-going    Target Date 10/20/20      PT SHORT TERM GOAL #2   Title After 4 weeks pt to demonstrate tolerance of seated thoracic A/ROM flexion, extension, and rotation without exacerbation of pain.    Baseline pain with active extension at eval, pain with OP in rotation and extension at eval.    Time  4    Period Weeks    Status On-going    Target Date 10/20/20               PT Long Term Goals - 11/03/20 1000       PT LONG TERM GOAL #1   Title After 8 weeks pt to demonstrate 4+/5  periscapular strength in Y and T position for heavy household ADLs    Baseline 10/02/20 3 bilat Y; 3+ bilat T; 7/5: 3 bilat Y; 3+ bilat T    Time 8    Period Weeks    Status On-going    Target Date 11/17/20      PT LONG TERM GOAL #2   Title After 8 weeks pt will demonstrate ability to lift and carry 10lb in each hand ~247f without exacerbation of pain to facilitate moving groceries into the house.    Baseline Patient able to perform task but exacerbated pain 1-2/10 on NPRS    Time 8    Period Weeks    Status Partially Met    Target Date 11/17/20      PT LONG TERM GOAL #3   Title After 8 weeks pt will reports mild to no discomfort with palpation to the anterior and anterolateral caudal thoracic cage.    Baseline significant tenderness/pain at eval    Time 8    Period Weeks    Status On-going    Target Date 11/17/20      PT LONG TERM GOAL #4   Title Patient will increase FOTO score to 46 to demonstrate predicted increase in functional mobility to complete ADLs    Baseline 10/02/20 55    Time 8    Period Weeks    Status On-going                   Plan - 11/10/20 1028     Clinical Impression Statement Patient tolerates session well evidenced by no increase in thoracic pain following therex. Pt continues to display decreased UE strength, decreased thoracic mobility, and decreased activity tolerance. Pt continue to need frequent rest breaks and multimodal cueing to correct form. Pt will continue to benefit from skilled PT in order achieve set goals.    Personal Factors and Comorbidities Age;Fitness;Education;Time since onset of injury/illness/exacerbation    Examination-Activity Limitations Lift;Transfers;Bed Mobility;Sleep;Carry    Examination-Participation Restrictions Laundry;Yard  Work;Shop;Community Activity    Rehab Potential Good    PT Frequency 2x / week    PT Duration 8 weeks    PT Treatment/Interventions ADLs/Self Care Home Management;Electrical Stimulation;Moist Heat;DME Instruction;Gait training;Functional mobility training;Therapeutic activities;Therapeutic exercise;Manual techniques;Dry needling;Patient/family education    PT Next Visit Plan Continue gentle periscapular strengthening, gentle A/ROM of T-spine. (FOTO)    PT Home Exercise Plan Posterior shoulder circles, BUE overhead reach "in the tube"    Consulted and Agree with Plan of Care Patient             Patient will benefit from skilled therapeutic intervention in order to improve the following deficits and impairments:  Cardiopulmonary status limiting activity, Decreased activity tolerance, Decreased mobility, Hypomobility, Decreased range of motion, Increased muscle spasms, Postural dysfunction, Improper body mechanics  Visit Diagnosis: Pain in thoracic spine     Problem List Patient Active Problem List   Diagnosis Date Noted   Situational stress 10/15/2020   Anxiety and depression 10/15/2020   Chronic cough 08/31/2020   Encounter for screening colonoscopy    Polyp of transverse colon    Dysphagia    Stricture and stenosis of esophagus    Cervical spondylosis 03/18/2019   [place]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   Need for prophylactic vaccination and inoculation against influenza 01/25/2019   Postoperative breast asymmetry 01/04/2019   Status post partial mastectomy 01/04/2019   Vitamin D deficiency 12/12/2018   Bursitis of left shoulder 11/21/2018   Rotator cuff  tendinitis, left 11/21/2018   Genetic testing 02/01/2018   Skin cancer 01/18/2018   Goals of care, counseling/discussion 01/12/2018   B12 deficiency 12/28/2017   Malignant neoplasm of lower-outer quadrant of right breast of female, estrogen receptor negative ([place]) 11/24/2017   Chronic abdominal pain (Primary Area of Pain)   04/03/2017   Chronic low back pain (Secondary Area of Pain) (B) (R>L) 04/03/2017   Chronic knee pain (Tertiary Area of Pain) (R) 04/03/2017   Chronic pain syndrome 04/03/2017   Disorder of bone, unspecified 04/03/2017   Other specified health status 04/03/2017   Other long term (current) drug therapy 04/03/2017   Long term current use of opiate analgesic 04/03/2017   Chronic sacroiliac joint pain 04/03/2017   S/P repair of ventral hernia 03/03/2017   Incisional hernia, without obstruction or gangrene 02/13/2017   Right adrenal mass ([place]) 12/28/2016   RIGHT Adrenal Mass 12/16/2016   Fatigue 02/17/2016   Other fatigue 02/17/2016   Multiple lung nodules on CT 02/17/2016   History of right breast cancer 02/17/2016   Peripheral neuropathy 02/17/2016   Closed fracture of a rib 02/02/2015   History of fundoplication 68/15/9470   History of throat cancer 01/15/2015   Acid reflux 01/15/2015   Gonalgia 01/15/2015   Closed fracture of pubis ([place]) 01/15/2015   Pelvic fracture ([place]) 01/12/2015   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[place]) 11/10/2014   Herpes simplex virus (HSV) epithelial keratitis 03/06/2013   Nuclear sclerotic cataract 03/06/2013   Onychia of finger 10/23/2008   Odontogenic tumor 03/13/2008   Tobacco use 06/14/2007     Durwin Reges DPT Sharion Settler, SPT  Durwin Reges 11/10/2020, 1:28 PM  [place] [facility] [address], [place], [address] Phone: [phone]   Fax:  [phone]  Name: LEONETTA MCGIVERN MRN: [id number] Date of Birth: July 19, 1955

## 2020-11-13 ENCOUNTER — Other Ambulatory Visit: Payer: Self-pay

## 2020-11-13 ENCOUNTER — Ambulatory Visit: Payer: Medicare HMO | Admitting: Physical Therapy

## 2020-11-13 ENCOUNTER — Encounter: Payer: Self-pay | Admitting: Physical Therapy

## 2020-11-13 DIAGNOSIS — M546 Pain in thoracic spine: Secondary | ICD-10-CM

## 2020-11-13 NOTE — Therapy (Signed)
[place] [facility] [address], [place], [address] Phone: [phone]   Fax:  [phone]  Physical Therapy Treatment  Patient Details  Name: Denise Watts MRN: [id number] Date of Birth: 04/02/1956 Referring Provider (PT): Sharmaine Base, DO   Encounter Date: 11/13/2020   PT End of Session - 11/13/20 1038     Visit Number 9    Number of Visits 17    Date for PT Re-Evaluation 11/17/20    Authorization Type [employer] Medicare HMO (VL TBD; $20 copay)    Authorization Time Period 09/22/20-11/17/20    PT Start Time 1019    PT Stop Time 1057    PT Time Calculation (min) 38 min    Activity Tolerance Patient tolerated treatment well    Behavior During Therapy [facility] for tasks assessed/performed             Past Medical History:  Diagnosis Date   Anxiety    Arthritis    KNEE RIGHT   Breast cancer ([place]) 1997   right breast lumpectomy with radiation   Breast cancer ([place]) 11/15/2017   INVASIVE MAMMARY CARCINOMA/ triple negative   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   Cough 03/02/2017   INTERMTTENT DRY COUGH   Depression    GERD (gastroesophageal reflux disease)    History of hiatal hernia    Personal history of chemotherapy    Personal history of radiation therapy    Skin cancer    Throat cancer ([place]) 1998   radiation    Past Surgical History:  Procedure Laterality Date   BREAST BIOPSY Right 1997   positive   BREAST BIOPSY Right 11/15/2017   INVASIVE MAMMARY CARCINOMA triple negative   BREAST BIOPSY Right 07/20/2020   [place] bx, Q marker, path pending   BREAST LUMPECTOMY Right 1997   2019 also   BREAST LUMPECTOMY WITH SENTINEL LYMPH NODE BIOPSY Right 12/08/2017   Procedure: BREAST LUMPECTOMY WITH SENTINEL LYMPH NODE BX;  Surgeon: Robert Bellow, MD;  Location: [facility];  Service: General;  Laterality: Right;   BREAST REDUCTION WITH MASTOPEXY Left 06/10/2019   Procedure: Left breast mastopexy reduction for  symmetry;  Surgeon: Wallace Going, DO;  Location: [place];  Service: Plastics;  Laterality: Left;  total 2.5 hours   COLONOSCOPY  2015   COLONOSCOPY WITH PROPOFOL N/A 10/29/2019   Procedure: COLONOSCOPY WITH PROPOFOL;  Surgeon: Lucilla Lame, MD;  Location: [facility];  Service: Endoscopy;  Laterality: N/A;   ESOPHAGOGASTRODUODENOSCOPY (EGD) WITH PROPOFOL N/A 05/24/2019   Procedure: ESOPHAGOGASTRODUODENOSCOPY (EGD) WITH PROPOFOL;  Surgeon: Lucilla Lame, MD;  Location: [facility] ENDOSCOPY;  Service: Endoscopy;  Laterality: N/A;   ESOPHAGUS SURGERY     KNEE SURGERY Right    LIPOSUCTION WITH LIPOFILLING Right 06/10/2019   Procedure: right breast scar and capsule release with fat grafting;  Surgeon: Wallace Going, DO;  Location: [place];  Service: Plastics;  Laterality: Right;   PORT-A-CATH REMOVAL     PORTACATH PLACEMENT Right 01/17/2018   Procedure: INSERTION PORT-A-CATH- RIGHT;  Surgeon: Robert Bellow, MD;  Location: [facility];  Service: General;  Laterality: Right;   ROBOTIC ADRENALECTOMY Right 12/28/2016   Procedure: ROBOTIC ADRENALECTOMY;  Surgeon: Hollice Espy, MD;  Location: [facility];  Service: Urology;  Laterality: Right;   SKIN CANCER EXCISION     THROAT SURGERY  1998   throat cancer    TUBAL LIGATION     VENTRAL HERNIA REPAIR N/A  03/03/2017   Procedure: HERNIA REPAIR VENTRAL ADULT;  Surgeon: Clayburn Pert, MD;  Location: [facility];  Service: General;  Laterality: N/A;    There were no vitals filed for this visit.   Subjective Assessment - 11/13/20 1021     Subjective Patient reports 2/10 pain in the her back. She reports reports being sore about 48 hours following last session.    Pertinent History Denise Watts is a 11yoF who is referred to OPPT sports rehab by plastic surgery after continued anterior bilateral thoracic. PMH: COPD, BrCA. Pt reports 2 bouts of BrCA 1997, 2019, both of which involving the right breast, both surgically  addressed, both associated with back pain and anterior trunk pain. Pt also reports a fall in 2016 in which she sustained multiple rip fractures.    How long can you sit comfortably? not limited    How long can you stand comfortably? not limited    How long can you walk comfortably? not limited              Therex   Nu Step L2 x 5 min UE only > 40 SPM   Y arm raises on wall 2 x 10 reps in scaption cues to squeeze shoulder blades   Y arm lift offs 2 x 10 reps 2-3 sec hold poff   Standing straight arm lat pull down 10# 2 x 10 cues to squeeze shoulder blades  Omega Chest Press 10# 2 x 6 reps    Low Rows RTB 2 x 8 reps cues to squeeze shoulder blades together  Thoracic Rotation along wall 2 x 30 sec each way with ball to stabilize hip                            PT Education - 11/13/20 1023     Education Details therex form/technique    Person(s) Educated Patient    Methods Explanation;Demonstration    Comprehension Verbalized understanding;Returned demonstration              PT Short Term Goals - 11/03/20 1604       PT SHORT TERM GOAL #1   Title After 4 weeks pt to increase FOTO score by >5 points to suggestion improved pt perception of functional ability.    Time 4    Period Weeks    Status On-going    Target Date 10/20/20      PT SHORT TERM GOAL #2   Title After 4 weeks pt to demonstrate tolerance of seated thoracic A/ROM flexion, extension, and rotation without exacerbation of pain.    Baseline pain with active extension at eval, pain with OP in rotation and extension at eval.    Time 4    Period Weeks    Status On-going    Target Date 10/20/20               PT Long Term Goals - 11/03/20 1000       PT LONG TERM GOAL #1   Title After 8 weeks pt to demonstrate 4+/5 periscapular strength in Y and T position for heavy household ADLs    Baseline 10/02/20 3 bilat Y; 3+ bilat T; 7/5: 3 bilat Y; 3+ bilat T    Time 8    Period Weeks     Status On-going    Target Date 11/17/20      PT LONG TERM GOAL #2   Title After 8 weeks pt will  demonstrate ability to lift and carry 10lb in each hand ~247f without exacerbation of pain to facilitate moving groceries into the house.    Baseline Patient able to perform task but exacerbated pain 1-2/10 on NPRS    Time 8    Period Weeks    Status Partially Met    Target Date 11/17/20      PT LONG TERM GOAL #3   Title After 8 weeks pt will reports mild to no discomfort with palpation to the anterior and anterolateral caudal thoracic cage.    Baseline significant tenderness/pain at eval    Time 8    Period Weeks    Status On-going    Target Date 11/17/20      PT LONG TERM GOAL #4   Title Patient will increase FOTO score to 46 to demonstrate predicted increase in functional mobility to complete ADLs    Baseline 10/02/20 55    Time 8    Period Weeks    Status On-going                   Plan - 11/13/20 1057     Clinical Impression Statement Patient tolerated session well evidenced by no increase in thoracic pain following therex. Pt continues to display decreased UE strength, decreased thoracic mobility, and decreased activity tolerance. Pt continue to need frequent rest breaks and multimodal cueing to correct form. Pt will continue to benefit from skilled PT in order achieve set goals.    Personal Factors and Comorbidities Age;Fitness;Education;Time since onset of injury/illness/exacerbation    Examination-Activity Limitations Lift;Transfers;Bed Mobility;Sleep;Carry    Examination-Participation Restrictions Laundry;Yard Work;Shop;Community Activity    PT Treatment/Interventions ADLs/Self Care Home Management;Electrical Stimulation;Moist Heat;DME Instruction;Gait training;Functional mobility training;Therapeutic activities;Therapeutic exercise;Manual techniques;Dry needling;Patient/family education    PT Next Visit Plan Continue gentle periscapular strengthening, gentle A/ROM  of T-spine. (FOTO)    PT Home Exercise Plan Posterior shoulder circles, BUE overhead reach "in the tube"    Consulted and Agree with Plan of Care Patient             Patient will benefit from skilled therapeutic intervention in order to improve the following deficits and impairments:  Cardiopulmonary status limiting activity, Decreased activity tolerance, Decreased mobility, Hypomobility, Decreased range of motion, Increased muscle spasms, Postural dysfunction, Improper body mechanics  Visit Diagnosis: Pain in thoracic spine     Problem List Patient Active Problem List   Diagnosis Date Noted   Situational stress 10/15/2020   Anxiety and depression 10/15/2020   Chronic cough 08/31/2020   Encounter for screening colonoscopy    Polyp of transverse colon    Dysphagia    Stricture and stenosis of esophagus    Cervical spondylosis 03/18/2019   [place]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   Need for prophylactic vaccination and inoculation against influenza 01/25/2019   Postoperative breast asymmetry 01/04/2019   Status post partial mastectomy 01/04/2019   Vitamin D deficiency 12/12/2018   Bursitis of left shoulder 11/21/2018   Rotator cuff tendinitis, left 11/21/2018   Genetic testing 02/01/2018   Skin cancer 01/18/2018   Goals of care, counseling/discussion 01/12/2018   B12 deficiency 12/28/2017   Malignant neoplasm of lower-outer quadrant of right breast of female, estrogen receptor negative ([place] 11/24/2017   Chronic abdominal pain (Primary Area of Pain)  04/03/2017   Chronic low back pain (Secondary Area of Pain) (B) (R>L) 04/03/2017   Chronic knee pain (Tertiary Area of Pain) (R) 04/03/2017   Chronic pain syndrome 04/03/2017   Disorder of bone, unspecified  04/03/2017   Other specified health status 04/03/2017   Other long term (current) drug therapy 04/03/2017   Long term current use of opiate analgesic 04/03/2017   Chronic sacroiliac joint pain 04/03/2017   S/P repair of  ventral hernia 03/03/2017   Incisional hernia, without obstruction or gangrene 02/13/2017   Right adrenal mass ([place]) 12/28/2016   RIGHT Adrenal Mass 12/16/2016   Fatigue 02/17/2016   Other fatigue 02/17/2016   Multiple lung nodules on CT 02/17/2016   History of right breast cancer 02/17/2016   Peripheral neuropathy 02/17/2016   Closed fracture of a rib 02/02/2015   History of fundoplication 70/96/2836   History of throat cancer 01/15/2015   Acid reflux 01/15/2015   Gonalgia 01/15/2015   Closed fracture of pubis ([place]) 01/15/2015   Pelvic fracture ([place]) 01/12/2015   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, [place], [address] Phone: [phone]   Fax:  [phone]  Name: Denise Watts MRN: [id number] Date of Birth: March 03, 1956

## 2020-11-17 ENCOUNTER — Other Ambulatory Visit: Payer: Self-pay | Admitting: Family Medicine

## 2020-11-17 ENCOUNTER — Ambulatory Visit: Payer: Medicare HMO | Admitting: Physical Therapy

## 2020-11-17 ENCOUNTER — Encounter: Payer: Self-pay | Admitting: Physical Therapy

## 2020-11-17 DIAGNOSIS — M546 Pain in thoracic spine: Secondary | ICD-10-CM

## 2020-11-17 NOTE — Therapy (Signed)
[facility] [address] [place], [address] Phone: [phone]   Fax:  [phone]  Physical Therapy Treatment Reassessment  Reporting period 11/03/2020-11/17/2020  Patient Details  Name: Denise Watts MRN: [id number] Date of Birth: May 03, 1955 Referring Provider (PT): Sharmaine Base, DO   Encounter Date: 11/17/2020   PT End of Session - 11/17/20 1127     Visit Number 10    Number of Visits 17    Date for PT Re-Evaluation 01/08/21    Authorization Type [employer] Medicare HMO (VL TBD; $20 copay)    Authorization Time Period 09/22/20-11/17/20    PT Start Time 1123    PT Stop Time 1203    PT Time Calculation (min) 40 min    Activity Tolerance Patient limited by pain    Behavior During Therapy [facility] for tasks assessed/performed             Past Medical History:  Diagnosis Date   Anxiety    Arthritis    KNEE RIGHT   Breast cancer ([place]) 1997   right breast lumpectomy with radiation   Breast cancer ([place]) 11/15/2017   INVASIVE MAMMARY CARCINOMA/ triple negative   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   Cough 03/02/2017   INTERMTTENT DRY COUGH   Depression    GERD (gastroesophageal reflux disease)    History of hiatal hernia    Personal history of chemotherapy    Personal history of radiation therapy    Skin cancer    Throat cancer ([place]) 1998   radiation    Past Surgical History:  Procedure Laterality Date   BREAST BIOPSY Right 1997   positive   BREAST BIOPSY Right 11/15/2017   INVASIVE MAMMARY CARCINOMA triple negative   BREAST BIOPSY Right 07/20/2020   [place] bx, Q marker, path pending   BREAST LUMPECTOMY Right 1997   2019 also   BREAST LUMPECTOMY WITH SENTINEL LYMPH NODE BIOPSY Right 12/08/2017   Procedure: BREAST LUMPECTOMY WITH SENTINEL LYMPH NODE BX;  Surgeon: Robert Bellow, MD;  Location: [facility];  Service: General;  Laterality: Right;   BREAST REDUCTION WITH MASTOPEXY Left 06/10/2019    Procedure: Left breast mastopexy reduction for symmetry;  Surgeon: Wallace Going, DO;  Location: [place];  Service: Plastics;  Laterality: Left;  total 2.5 hours   COLONOSCOPY  2015   COLONOSCOPY WITH PROPOFOL N/A 10/29/2019   Procedure: COLONOSCOPY WITH PROPOFOL;  Surgeon: Lucilla Lame, MD;  Location: [facility] ENDOSCOPY;  Service: Endoscopy;  Laterality: N/A;   ESOPHAGOGASTRODUODENOSCOPY (EGD) WITH PROPOFOL N/A 05/24/2019   Procedure: ESOPHAGOGASTRODUODENOSCOPY (EGD) WITH PROPOFOL;  Surgeon: Lucilla Lame, MD;  Location: [facility] ENDOSCOPY;  Service: Endoscopy;  Laterality: N/A;   ESOPHAGUS SURGERY     KNEE SURGERY Right    LIPOSUCTION WITH LIPOFILLING Right 06/10/2019   Procedure: right breast scar and capsule release with fat grafting;  Surgeon: Wallace Going, DO;  Location: [place];  Service: Plastics;  Laterality: Right;   PORT-A-CATH REMOVAL     PORTACATH PLACEMENT Right 01/17/2018   Procedure: INSERTION PORT-A-CATH- RIGHT;  Surgeon: Robert Bellow, MD;  Location: [facility];  Service: General;  Laterality: Right;   ROBOTIC ADRENALECTOMY Right 12/28/2016   Procedure: ROBOTIC ADRENALECTOMY;  Surgeon: Hollice Espy, MD;  Location: [facility];  Service: Urology;  Laterality: Right;   SKIN CANCER EXCISION     THROAT SURGERY  1998   throat cancer    TUBAL LIGATION  VENTRAL HERNIA REPAIR N/A 03/03/2017   Procedure: HERNIA REPAIR VENTRAL ADULT;  Surgeon: Clayburn Pert, MD;  Location: [facility];  Service: General;  Laterality: N/A;    There were no vitals filed for this visit.   Subjective Assessment - 11/17/20 1126     Subjective Patient reports 2/10 pain in the her back. She denies any soreness following last session but does report fatigue and soreness from mowing her lawn ~5 hours.    Pertinent History Denise Watts is a 12yoF who is referred to OPPT sports rehab by plastic surgery after continued anterior bilateral thoracic. PMH: COPD, BrCA. Pt  reports 2 bouts of BrCA 1997, 2019, both of which involving the right breast, both surgically addressed, both associated with back pain and anterior trunk pain. Pt also reports a fall in 2016 in which she sustained multiple rip fractures.    How long can you sit comfortably? not limited    How long can you stand comfortably? not limited    How long can you walk comfortably? not limited               Therex  Y Lift offs from wall 1#  DB 1 x 10 reps  Y raises on wall 1# Db 1 x 10 reps  TRX Low Rows  1 x 10 reps emphasis on scap retraction Trx snow angles 1 x 10 reps   Farmers Carry 10# bilateral UE 1 x 28ft with no reports of increased pain                           PT Education - 11/17/20 1208     Education Details Pt educated on thoracic pain should be improving to consult MD.    Terence Lux) Educated Patient    Methods Explanation    Comprehension Verbalized understanding              PT Short Term Goals - 11/17/20 1143       PT SHORT TERM GOAL #1   Title After 4 weeks pt to increase FOTO score by >5 points to suggestion improved pt perception of functional ability.    Baseline 11/17/2020: increased by 1 point    Time 4    Period Weeks    Status On-going      PT SHORT TERM GOAL #2   Title After 4 weeks pt to demonstrate tolerance of seated thoracic A/ROM flexion, extension, and rotation without exacerbation of pain.    Baseline pain with active extension at eval, pain with OP in rotation and extension at eval. 11/17/20: continued pain with rotation R<>L    Time 4    Period Weeks    Status On-going               PT Long Term Goals - 11/17/20 1131       PT LONG TERM GOAL #1   Title After 8 weeks pt to demonstrate 4+/5 periscapular strength in Y and T position for heavy household ADLs    Baseline 10/02/20 3 bilat Y; 3+ bilat T; 11/03/20: 3 bilat Y; 3+ bilat T: 11/17/20: 4 Y bilat , 4 T bilat    Time 8    Period Weeks    Status On-going       PT LONG TERM GOAL #2   Title After 8 weeks pt will demonstrate ability to lift and carry 10lb in each hand ~271ft without exacerbation of pain to facilitate moving  groceries into the house.    Baseline Patient able to perform task but exacerbated pain 1-2/10 on NPRS; 11/17/20; no exacerbation of pain/goal met    Time 8    Period Weeks    Status Achieved      PT LONG TERM GOAL #3   Title After 8 weeks pt will reports mild to no discomfort with palpation to the anterior and anterolateral caudal thoracic cage.    Baseline significant tenderness/pain at eval; 11/17/20: reported tenderness to anterior/anteriorlateral thoracic cage worsening    Time 8    Period Weeks    Status On-going      PT LONG TERM GOAL #4   Title Patient will increase FOTO score to 46 to demonstrate predicted increase in functional mobility to complete ADLs    Baseline 10/02/20 46; 11/17/20 47    Time 8    Period Weeks    Status On-going                   Plan - 11/17/20 1204     Clinical Impression Statement Pt session limited today due to pain. PT progress note performed this date. Pt continues to be progressing towards set goals with noted increase in prone Ys and Ts strength and no increased pain with farmer carry. Pt does continue to report increased anterior thoracic pain without exacerbation or further injury. Continue PT POC as able.    Personal Factors and Comorbidities Age;Fitness;Education;Time since onset of injury/illness/exacerbation    Examination-Activity Limitations Lift;Transfers;Bed Mobility;Sleep;Carry    Examination-Participation Restrictions Laundry;Yard Work;Shop;Community Activity    Rehab Potential Good    PT Frequency 2x / week    PT Duration 8 weeks    PT Treatment/Interventions ADLs/Self Care Home Management;Electrical Stimulation;Moist Heat;DME Instruction;Gait training;Functional mobility training;Therapeutic activities;Therapeutic exercise;Manual techniques;Dry  needling;Patient/family education    PT Next Visit Plan Continue gentle periscapular strengthening, gentle A/ROM of T-spine. (FOTO)    PT Home Exercise Plan Posterior shoulder circles, BUE overhead reach "in the tube"    Consulted and Agree with Plan of Care Patient             Patient will benefit from skilled therapeutic intervention in order to improve the following deficits and impairments:  Cardiopulmonary status limiting activity, Decreased activity tolerance, Decreased mobility, Hypomobility, Decreased range of motion, Increased muscle spasms, Postural dysfunction, Improper body mechanics  Visit Diagnosis: Pain in thoracic spine     Problem List Patient Active Problem List   Diagnosis Date Noted   Situational stress 10/15/2020   Anxiety and depression 10/15/2020   Chronic cough 08/31/2020   Encounter for screening colonoscopy    Polyp of transverse colon    Dysphagia    Stricture and stenosis of esophagus    Cervical spondylosis 03/18/2019   [place]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   Need for prophylactic vaccination and inoculation against influenza 01/25/2019   Postoperative breast asymmetry 01/04/2019   Status post partial mastectomy 01/04/2019   Vitamin D deficiency 12/12/2018   Bursitis of left shoulder 11/21/2018   Rotator cuff tendinitis, left 11/21/2018   Genetic testing 02/01/2018   Skin cancer 01/18/2018   Goals of care, counseling/discussion 01/12/2018   B12 deficiency 12/28/2017   Malignant neoplasm of lower-outer quadrant of right breast of female, estrogen receptor negative ([place]) 11/24/2017   Chronic abdominal pain (Primary Area of Pain)  04/03/2017   Chronic low back pain (Secondary Area of Pain) (B) (R>L) 04/03/2017   Chronic knee pain [facility] Area of Pain) (R) 04/03/2017   Chronic  pain syndrome 04/03/2017   Disorder of bone, unspecified 04/03/2017   Other specified health status 04/03/2017   Other long term (current) drug therapy 04/03/2017    Long term current use of opiate analgesic 04/03/2017   Chronic sacroiliac joint pain 04/03/2017   S/P repair of ventral hernia 03/03/2017   Incisional hernia, without obstruction or gangrene 02/13/2017   Right adrenal mass ([place]) 12/28/2016   RIGHT Adrenal Mass 12/16/2016   Fatigue 02/17/2016   Other fatigue 02/17/2016   Multiple lung nodules on CT 02/17/2016   History of right breast cancer 02/17/2016   Peripheral neuropathy 02/17/2016   Closed fracture of a rib 02/02/2015   History of fundoplication 99/80/6999   History of throat cancer 01/15/2015   Acid reflux 01/15/2015   Gonalgia 01/15/2015   Closed fracture of pubis ([place]) 01/15/2015   Pelvic fracture ([place]) 01/12/2015   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[place]) 11/10/2014   Herpes simplex virus (HSV) epithelial keratitis 03/06/2013   Nuclear sclerotic cataract 03/06/2013   Onychia of finger 10/23/2008   Odontogenic tumor 03/13/2008   Tobacco use 06/14/2007    Durwin Reges DPT Sharion Settler, SPT  Durwin Reges 11/17/2020, 1:21 PM  [facility] [facility] [place] [facility] [address], [place], [address] Phone: [phone]   Fax:  [phone]  Name: [name] LEITZKE MRN: [id number] Date of Birth: April 08, 1956

## 2020-11-17 NOTE — Telephone Encounter (Signed)
Requested Prescriptions  Pending Prescriptions Disp Refills  . gabapentin (NEURONTIN) 300 MG capsule [Pharmacy Med Name: GABAPENTIN 300 MG CAP] 60 capsule 1    Sig: TAKE 1 CAPSULE BY MOUTH 3 TIMES A DAY     Neurology: Anticonvulsants - gabapentin Passed - 11/17/2020  3:16 PM      Passed - Valid encounter within last 12 months    Recent Outpatient Visits          1 month ago Anxiety and depression   [place], [place]-C   3 months ago Upper respiratory tract infection, unspecified type   [facility], [place], [place]   5 months ago [place], [place], [place]   5 months ago Cough   [place], [place]   7 months ago COPD exacerbation [facility])   [place], [place] Grammes      Future Appointments            In 6 days Martyn Ehrich, NP [facility]   In 3 weeks Hollice Espy, MD [place]   In 3 weeks Kate Sable, MD [facility], [place]

## 2020-11-20 ENCOUNTER — Encounter: Payer: Self-pay | Admitting: Physical Therapy

## 2020-11-20 ENCOUNTER — Ambulatory Visit: Payer: Medicare HMO | Admitting: Physical Therapy

## 2020-11-20 ENCOUNTER — Other Ambulatory Visit: Payer: Self-pay

## 2020-11-20 DIAGNOSIS — M546 Pain in thoracic spine: Secondary | ICD-10-CM

## 2020-11-20 NOTE — Therapy (Signed)
[facility] [address] [place], [address] Phone: [phone]   Fax:  [phone]  Physical Therapy Treatment  Patient Details  Name: Denise Watts MRN: [id number] Date of Birth: Sep 08, 1955 Referring Provider (PT): Sharmaine Base, DO   Encounter Date: 11/20/2020   PT End of Session - 11/20/20 0851     Visit Number 11    Number of Visits 17    Date for PT Re-Evaluation 01/08/21    Authorization Type [employer] Medicare HMO (VL TBD; $20 copay)    Authorization Time Period 09/22/20-11/17/20    PT Start Time 0845    PT Stop Time 0927    PT Time Calculation (min) 42 min    Activity Tolerance Patient tolerated treatment well    Behavior During Therapy [facility] for tasks assessed/performed             Past Medical History:  Diagnosis Date   Anxiety    Arthritis    KNEE RIGHT   Breast cancer ([place]) 1997   right breast lumpectomy with radiation   Breast cancer ([place]) 11/15/2017   INVASIVE MAMMARY CARCINOMA/ triple negative   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   Cough 03/02/2017   INTERMTTENT DRY COUGH   Depression    GERD (gastroesophageal reflux disease)    History of hiatal hernia    Personal history of chemotherapy    Personal history of radiation therapy    Skin cancer    Throat cancer ([place]) 1998   radiation    Past Surgical History:  Procedure Laterality Date   BREAST BIOPSY Right 1997   positive   BREAST BIOPSY Right 11/15/2017   INVASIVE MAMMARY CARCINOMA triple negative   BREAST BIOPSY Right 07/20/2020   [place] bx, Q marker, path pending   BREAST LUMPECTOMY Right 1997   2019 also   BREAST LUMPECTOMY WITH SENTINEL LYMPH NODE BIOPSY Right 12/08/2017   Procedure: BREAST LUMPECTOMY WITH SENTINEL LYMPH NODE BX;  Surgeon: Robert Bellow, MD;  Location: [facility];  Service: General;  Laterality: Right;   BREAST REDUCTION WITH MASTOPEXY Left 06/10/2019   Procedure: Left breast mastopexy reduction for  symmetry;  Surgeon: Wallace Going, DO;  Location: [place];  Service: Plastics;  Laterality: Left;  total 2.5 hours   COLONOSCOPY  2015   COLONOSCOPY WITH PROPOFOL N/A 10/29/2019   Procedure: COLONOSCOPY WITH PROPOFOL;  Surgeon: Lucilla Lame, MD;  Location: [facility] END[facility];  Service: Endoscopy;  Laterality: N/A;   ESOPHAGOGASTRODUODENOSCOPY (EGD) WITH PROPOFOL N/A 05/24/2019   Procedure: ESOPHAGOGASTRODUODENOSCOPY (EGD) WITH PROPOFOL;  Surgeon: Lucilla Lame, MD;  Location: [facility] ENDOSCOPY;  Service: Endoscopy;  Laterality: N/A;   ESOPHAGUS SURGERY     KNEE SURGERY Right    LIPOSUCTION WITH LIPOFILLING Right 06/10/2019   Procedure: right breast scar and capsule release with fat grafting;  Surgeon: Wallace Going, DO;  Location: [place];  Service: Plastics;  Laterality: Right;   PORT-A-CATH REMOVAL     PORTACATH PLACEMENT Right 01/17/2018   Procedure: INSERTION PORT-A-CATH- RIGHT;  Surgeon: Robert Bellow, MD;  Location: [facility];  Service: General;  Laterality: Right;   ROBOTIC ADRENALECTOMY Right 12/28/2016   Procedure: ROBOTIC ADRENALECTOMY;  Surgeon: Hollice Espy, MD;  Location: [facility];  Service: Urology;  Laterality: Right;   SKIN CANCER EXCISION     THROAT SURGERY  1998   throat cancer    TUBAL LIGATION     VENTRAL HERNIA REPAIR N/A  03/03/2017   Procedure: HERNIA REPAIR VENTRAL ADULT;  Surgeon: Clayburn Pert, MD;  Location: [facility];  Service: General;  Laterality: N/A;    There were no vitals filed for this visit.   Subjective Assessment - 11/20/20 0845     Subjective Patient reports having 0/10 pain today back pain today. She states shes feeling well  compared to last visit.    Pertinent History Denise Watts is a 53yoF who is referred to OPPT sports rehab by plastic surgery after continued anterior bilateral thoracic. PMH: COPD, BrCA. Pt reports 2 bouts of BrCA 1997, 2019, both of which involving the right breast, both surgically  addressed, both associated with back pain and anterior trunk pain. Pt also reports a fall in 2016 in which she sustained multiple rip fractures.    How long can you sit comfortably? not limited    How long can you stand comfortably? not limited    How long can you walk comfortably? not limited              Therapeutic Exercise  Nu Step L2 x 5 min UE only   Reverse DB shoulder press 3 x 10 reps #2  Rear Delt Fly #1 DB 1 x 10 reps Painful  L UE; ceased  Shoulder Abd 3 x 10 reps 1# Db  OMEGA Chest Press 2 x 10 reps #10 cues to fully extend arms and not push with with back   Low Row  + mini squat RTB 2 x 10 reps cues to squeeze scap  High Row RTB 2 x 10 reps  cues to scap squeeze  T spine rotation on wall 1 x 60 each side  Manual Therapy   Thoracic segmental extension T2-T10  Supine knee drops L<>R to facilitate T-spine rotation                                  PT Education - 11/20/20 0929     Education Details Pt educated on T-spine rotational mobility and exercises.    Person(s) Educated Patient    Methods Explanation;Demonstration    Comprehension Verbalized understanding;Returned demonstration              PT Short Term Goals - 11/17/20 1143       PT SHORT TERM GOAL #1   Title After 4 weeks pt to increase FOTO score by >5 points to suggestion improved pt perception of functional ability.    Baseline 11/17/2020: increased by 1 point    Time 4    Period Weeks    Status On-going      PT SHORT TERM GOAL #2   Title After 4 weeks pt to demonstrate tolerance of seated thoracic A/ROM flexion, extension, and rotation without exacerbation of pain.    Baseline pain with active extension at eval, pain with OP in rotation and extension at eval. 11/17/20: continued pain with rotation R<>L    Time 4    Period Weeks    Status On-going               PT Long Term Goals - 11/17/20 1131       PT LONG TERM GOAL #1   Title After 8  weeks pt to demonstrate 4+/5 periscapular strength in Y and T position for heavy household ADLs    Baseline 10/02/20 3 bilat Y; 3+ bilat T; 11/03/20: 3 bilat Y; 3+ bilat T: 11/17/20: 4 Y  bilat , 4 T bilat    Time 8    Period Weeks    Status On-going      PT LONG TERM GOAL #2   Title After 8 weeks pt will demonstrate ability to lift and carry 10lb in each hand ~26f without exacerbation of pain to facilitate moving groceries into the house.    Baseline Patient able to perform task but exacerbated pain 1-2/10 on NPRS; 11/17/20; no exacerbation of pain/goal met    Time 8    Period Weeks    Status Achieved      PT LONG TERM GOAL #3   Title After 8 weeks pt will reports mild to no discomfort with palpation to the anterior and anterolateral caudal thoracic cage.    Baseline significant tenderness/pain at eval; 11/17/20: reported tenderness to anterior/anteriorlateral thoracic cage worsening    Time 8    Period Weeks    Status On-going      PT LONG TERM GOAL #4   Title Patient will increase FOTO score to 46 to demonstrate predicted increase in functional mobility to complete ADLs    Baseline 10/02/20 46; 11/17/20 47    Time 8    Period Weeks    Status On-going                   Plan - 11/20/20 1030     Clinical Impression Statement Pt tolerated session well evidenced by no increased back pain following therex and manual therapy. PT continued periscapular strengthening progression and T-spine mobility. PT utilized multimodal cueing to ensure proper form and safe progression. Continue PT POC.    Personal Factors and Comorbidities Age;Fitness;Education;Time since onset of injury/illness/exacerbation    Examination-Activity Limitations Lift;Transfers;Bed Mobility;Sleep;Carry    Examination-Participation Restrictions Laundry;Yard Work;Shop;Community Activity    Stability/Clinical Decision Making Evolving/Moderate complexity    Clinical Decision Making Moderate    Rehab Potential Good    PT  Frequency 2x / week    PT Duration 8 weeks    PT Treatment/Interventions ADLs/Self Care Home Management;Electrical Stimulation;Moist Heat;DME Instruction;Gait training;Functional mobility training;Therapeutic activities;Therapeutic exercise;Manual techniques;Dry needling;Patient/family education    PT Next Visit Plan Continue gentle periscapular strengthening, gentle A/ROM of T-spine. (FOTO)    PT Home Exercise Plan Posterior shoulder circles, BUE overhead reach "in the tube"    Consulted and Agree with Plan of Care Patient             Patient will benefit from skilled therapeutic intervention in order to improve the following deficits and impairments:  Cardiopulmonary status limiting activity, Decreased activity tolerance, Decreased mobility, Hypomobility, Decreased range of motion, Increased muscle spasms, Postural dysfunction, Improper body mechanics  Visit Diagnosis: Pain in thoracic spine     Problem List Patient Active Problem List   Diagnosis Date Noted   Situational stress 10/15/2020   Anxiety and depression 10/15/2020   Chronic cough 08/31/2020   Encounter for screening colonoscopy    Polyp of transverse colon    Dysphagia    Stricture and stenosis of esophagus    Cervical spondylosis 03/18/2019   [place]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   Need for prophylactic vaccination and inoculation against influenza 01/25/2019   Postoperative breast asymmetry 01/04/2019   Status post partial mastectomy 01/04/2019   Vitamin D deficiency 12/12/2018   Bursitis of left shoulder 11/21/2018   Rotator cuff tendinitis, left 11/21/2018   Genetic testing 02/01/2018   Skin cancer 01/18/2018   Goals of care, counseling/discussion 01/12/2018   B12 deficiency 12/28/2017   Malignant  neoplasm of lower-outer quadrant of right breast of female, estrogen receptor negative ([place]) 11/24/2017   Chronic abdominal pain (Primary Area of Pain)  04/03/2017   Chronic low back pain (Secondary Area of Pain)  (B) (R>L) 04/03/2017   Chronic knee pain [facility] Area of Pain) (R) 04/03/2017   Chronic pain syndrome 04/03/2017   Disorder of bone, unspecified 04/03/2017   Other specified health status 04/03/2017   Other long term (current) drug therapy 04/03/2017   Long term current use of opiate analgesic 04/03/2017   Chronic sacroiliac joint pain 04/03/2017   S/P repair of ventral hernia 03/03/2017   Incisional hernia, without obstruction or gangrene 02/13/2017   Right adrenal mass ([place]) 12/28/2016   RIGHT Adrenal Mass 12/16/2016   Fatigue 02/17/2016   Other fatigue 02/17/2016   Multiple lung nodules on CT 02/17/2016   History of right breast cancer 02/17/2016   Peripheral neuropathy 02/17/2016   Closed fracture of a rib 02/02/2015   History of fundoplication 60/47/9987   History of throat cancer 01/15/2015   Acid reflux 01/15/2015   Gonalgia 01/15/2015   Closed fracture of pubis ([place]) 01/15/2015   Pelvic fracture ([place]) 01/12/2015   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, [place], [address] Phone: [phone]   Fax:  [phone]  Name: DAWANA ASPER MRN: [id number] Date of Birth: 1956/03/08

## 2020-11-23 ENCOUNTER — Ambulatory Visit: Payer: Medicare HMO | Admitting: Primary Care

## 2020-11-23 ENCOUNTER — Other Ambulatory Visit: Payer: Self-pay | Admitting: Family Medicine

## 2020-11-23 NOTE — Telephone Encounter (Signed)
LOV: 10/15/2020  NOV: Not scheduled.

## 2020-11-23 NOTE — Progress Notes (Deleted)
$'@Patient'q$  ID: Denise Watts, female    DOB: 1955-09-30, 65 y.o.   MRN: [id number]  No chief complaint on file.   Referring provider: Margo Common, PA-C  HPI: 65 year old female, former smoker quit 1997 (45-pack-year history).  Past medical history significant for vocal cord cancer, GERD, aphasia, chronic cough, multiple lung nodules on CT, breast cancer, Parkinson's disease, depression, restless leg syndrome.  Patient of Dr. Halford Chessman, last seen in office on 10/28/2020.  Previous [facility] pulmonary encounter: 65 year old female former smoker seen for pulmonary consult August 06, 2020 for chronic cough. Medical history significant for right breast cancer diagnosed in January 1997 status post lumpectomy and radiation and 2019 on the right breast status post chemo, vocal cord carcinoma status post radiation therapy  Barium Swallow 1/20215 - 1 episode of mild laryngeal penetration , fixed narrowing of lower cervical esophagus .   08/28/2020 Follow up  : Chronic cough , Bronchitis  Patient returns for a 2-week follow-up.  Patient was seen last visit for a pulmonary consult for cough x 5 months.  She has received several courses of antibiotics without significant improvement, this included doxycycline, Bactrim and Augmentin along with a prednisone taper. Last visit was recommended to begin a Z-Pak.  She was set up for pulmonary function testing yesterday that showed showed small obstructive airways disease with hyperinflation and air trapping.  FEV1 was 55%, ratio 77, FVC 54%, no significant bronchodilator response DLCO 35%.  Patient has significant cough throughout the test.  CT chest 4/22  shows no evidence of progressive metastatic disease within the chest, left axillary node is not pathologic in size but minimally enlarged from previous exam.  Stable bilateral pulmonary nodules.  Moderate emphysema Since last visit patient is feeling about the same with only minimal decrease in cough . Cough is minimally  improved, feels like she has mucus but can not cough up anything.  She remains on BREZTRI Twice daily  . Chronic dysphagia since head/neck radiation due to vocal cord cancer.  No hemoptysis , n/v/d, chest pain or orthopnea.     10/28/20- Dr. Halford Chessman  Sputum cultures and PFT not completed.  CT chest from April showed emphysema and stable nodules except for new LLL 8 mm GGO.  Still has cough with yellow sputum.  Breztri and albuterol help.  Feels feverish.  Prednisone hasn't helped.    [place] recently and injured her pelvis.  Since then she can't control her bladder especially when she goes from sitting to standing.    11/23/2020 - Interim hx  Patient presents today for 1 month follow-up/emphysema with chronic cough. During last visit with Dr. Halford Chessman she was given course of Levaquin and asked to provide sputum sample. May need to arrange bronchoscopy for airway sampling.    CT chest in April 2022 showed stable lung nodules. She is due for follow-up CT chest in October to monitor lung nodules.    TEST/EVENTS :  CT chest 08/14/19 >> centrilobular emphysema, scarring RUL, 3 mm nodule RML, 3 mm nodule RLL, mild GGO 1.2 x 0.9 cm RLL, 4 mm nodule LUL, 1.1 x 1.2 GGO LUL, 1.2 x 1 cm nodule LLL   Allergies  Allergen Reactions   Sulfa Antibiotics Other (See Comments)    Mouth sore and raw    Immunization History  Administered Date(s) Administered   Fluad Quad(high Dose 65+) 01/31/2019, 02/20/2020   Influenza,inj,Quad PF,6+ Mos 03/02/2017, 06/08/2018   PFIZER(Purple Top)SARS-COV-2 Vaccination 07/05/2019, 07/26/2019, 03/31/2020    Past Medical History:  Diagnosis Date   Anxiety    Arthritis    KNEE RIGHT   Breast cancer [facility]) 1997   right breast lumpectomy with radiation   Breast cancer ([place]) 11/15/2017   INVASIVE MAMMARY CARCINOMA/ triple negative   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[facility]) 11/10/2014   Cough 03/02/2017   INTERMTTENT DRY COUGH   Depression    GERD (gastroesophageal reflux disease)     History of hiatal hernia    Personal history of chemotherapy    Personal history of radiation therapy    Skin cancer    Throat cancer ([place]) 1998   radiation    Tobacco History: Social History   Tobacco Use  Smoking Status Former   Packs/day: 1.50   Years: 30.00   Pack years: 45.00   Types: Cigarettes   Quit date: 05/03/1995   Years since quitting: 25.5  Smokeless Tobacco Never   Counseling given: Not Answered   Outpatient Medications Prior to Visit  Medication Sig Dispense Refill   aspirin EC 81 MG tablet Take 1 tablet (81 mg total) by mouth daily. Swallow whole. 90 tablet 3   benzonatate (TESSALON) 200 MG capsule TAKE 1 CAPSULE BY MOUTH 3 TIMES DAILY ASNEEDED 30 capsule 0   Budeson-Glycopyrrol-Formoterol (BREZTRI AEROSPHERE) 160-9-4.8 MCG/ACT AERO Inhale 2 puffs into the lungs in the morning and at bedtime. 5.9 g 5   busPIRone (BUSPAR) 5 MG tablet Take 1 tablet (5 mg total) by mouth 2 (two) times daily. 60 tablet 0   cyclobenzaprine (FLEXERIL) 5 MG tablet TAKE 1 TABLET BY MOUTH 3 TIMES DAILY AS NEEDED FOR MUSCLE SPASMS. 30 tablet 0   DULoxetine (CYMBALTA) 60 MG capsule Take by mouth.     fluticasone (FLONASE) 50 MCG/ACT nasal spray Place 1 spray into both nostrils at bedtime. 16 g 6   gabapentin (NEURONTIN) 300 MG capsule TAKE 1 CAPSULE BY MOUTH 3 TIMES A DAY 60 capsule 1   levofloxacin (LEVAQUIN) 500 MG tablet Take 1 tablet (500 mg total) by mouth daily. 7 tablet 0   omeprazole (PRILOSEC) 40 MG capsule TAKE 1 CAPSULE BY MOUTH EVERY DAY (MORNING) 90 capsule 4   ondansetron (ZOFRAN) 4 MG tablet Take 1 tablet (4 mg total) by mouth every 8 (eight) hours as needed for nausea or vomiting. 20 tablet 0   rOPINIRole (REQUIP) 0.25 MG tablet Take 1-2 tablets by mouth at bedtime for restless legs. 60 tablet 1   Vibegron (GEMTESA) 75 MG TABS Take 75 mg by mouth daily. 30 tablet 11   Facility-Administered Medications Prior to Visit  Medication Dose Route Frequency Provider Last Rate Last  Admin   cyanocobalamin ((VITAMIN B-12)) injection 1,000 mcg  1,000 mcg Intramuscular Q30 days Sindy Guadeloupe, MD   1,000 mcg at 08/03/18 [id number]   cyanocobalamin ((VITAMIN B-12)) injection 1,000 mcg  1,000 mcg Intramuscular Q30 days Sindy Guadeloupe, MD   1,000 mcg at 09/28/18 1013   cyanocobalamin ((VITAMIN B-12)) injection 1,000 mcg  1,000 mcg Intramuscular Q30 days Sindy Guadeloupe, MD   1,000 mcg at 11/30/18 1355   cyanocobalamin ((VITAMIN B-12)) injection 1,000 mcg  1,000 mcg Intramuscular Q30 days Sindy Guadeloupe, MD   1,000 mcg at 08/16/19 1144      Review of Systems  Review of Systems   Physical Exam  There were no vitals taken for this visit. Physical Exam   Lab Results:  CBC    Component Value Date/Time   WBC 8.3 10/15/2020 1024   WBC 5.7 08/20/2020 1343  RBC 4.19 10/15/2020 1024   RBC 3.97 08/20/2020 1343   HGB 13.6 10/15/2020 1024   HCT 40.9 10/15/2020 1024   PLT 247 10/15/2020 1024   MCV 98 (H) 10/15/2020 1024   MCV 100 04/08/2014 0409   MCH 32.5 10/15/2020 1024   MCH 33.0 08/20/2020 1343   MCHC 33.3 10/15/2020 1024   MCHC 33.1 08/20/2020 1343   RDW 12.0 10/15/2020 1024   RDW 12.7 04/08/2014 0409   LYMPHSABS 0.9 10/15/2020 1024   LYMPHSABS 0.8 (L) 04/08/2014 0409   MONOABS 0.4 08/20/2020 1343   MONOABS 0.7 04/08/2014 0409   EOSABS 0.1 10/15/2020 1024   EOSABS 0.0 04/08/2014 0409   BASOSABS 0.0 10/15/2020 1024   BASOSABS 0.0 04/08/2014 0409    BMET    Component Value Date/Time   NA 142 10/15/2020 1024   NA 143 04/08/2014 0409   K 4.4 10/15/2020 1024   K 3.5 04/08/2014 0409   CL 101 10/15/2020 1024   CL 113 (H) 04/08/2014 0409   CO2 23 10/15/2020 1024   CO2 26 04/08/2014 0409   GLUCOSE 107 (H) 10/15/2020 1024   GLUCOSE 144 (H) 08/20/2020 1343   GLUCOSE 84 04/08/2014 0409   BUN 12 10/15/2020 1024   BUN 6 (L) 04/08/2014 0409   CREATININE 0.80 10/15/2020 1024   CREATININE 0.67 04/08/2014 0409   CALCIUM 9.5 10/15/2020 1024   CALCIUM 7.5 (L)  04/08/2014 0409   GFRNONAA >60 08/20/2020 1343   GFRNONAA >60 04/08/2014 0409   GFRNONAA >60 11/26/2012 1019   GFRAA >60 01/20/2020 1314   GFRAA >60 04/08/2014 0409   GFRAA >60 11/26/2012 1019    BNP No results found for: BNP  ProBNP No results found for: PROBNP  Imaging: NM Myocar Multi W/Spect W/Wall Motion / EF  Result Date: 10/27/2020  Normal pharmacologic myocardial perfusion stress test without significant ischemia or scar.  The left ventricular ejection fraction is normal (62%).  Aortic atherosclerosis noted on the attenuation correction CT. There is no significant coronary artery calcification.  Left lower lobe lung nodule measuring ~1 cm is stable compared with dedicated chest CT from 08/21/2020.  This is a low risk study. Sensitivity and specificity of the study are degraded by significant extracardiac activity.      Assessment & Plan:   No problem-specific Assessment & Plan notes found for this encounter.     Martyn Ehrich, NP 11/23/2020

## 2020-11-23 NOTE — Telephone Encounter (Signed)
Requested medication (s) are due for refill today: Yes  Requested medication (s) are on the active medication list: Yes  Last refill:  10/29/20  Future visit scheduled: No  Notes to clinic:  See request.    Requested Prescriptions  Pending Prescriptions Disp Refills   cyclobenzaprine (FLEXERIL) 5 MG tablet [Pharmacy Med Name: CYCLOBENZAPRINE HCL 5 MG TAB] 30 tablet 0    Sig: TAKE 1 TABLET BY MOUTH 3 TIMES DAILY AS NEEDED FOR MUSCLE SPASMS.      Not Delegated - Analgesics:  Muscle Relaxants Failed - 11/23/2020 12:23 PM      Failed - This refill cannot be delegated      Passed - Valid encounter within last 6 months    Recent Outpatient Visits           1 month ago Anxiety and depression   [place], PA-C   4 months ago Upper respiratory tract infection, unspecified type   [facility], [place], [place]   5 months ago [place], [place], [place]   5 months ago Cough   [place], [place]   7 months ago COPD exacerbation [facility])   [place], Dr[place]illa Grammes       Future Appointments             In 2 weeks Martyn Ehrich, NP [facility]   In 2 weeks Hollice Espy, MD [place]   In 3 weeks Kate Sable, MD [facility], [place]

## 2020-11-24 ENCOUNTER — Ambulatory Visit: Payer: Medicare HMO | Admitting: Physical Therapy

## 2020-11-26 ENCOUNTER — Other Ambulatory Visit: Payer: Self-pay | Admitting: Cardiology

## 2020-11-26 DIAGNOSIS — I2510 Atherosclerotic heart disease of native coronary artery without angina pectoris: Secondary | ICD-10-CM

## 2020-11-26 DIAGNOSIS — R079 Chest pain, unspecified: Secondary | ICD-10-CM

## 2020-11-27 ENCOUNTER — Other Ambulatory Visit: Payer: Self-pay

## 2020-11-27 ENCOUNTER — Ambulatory Visit: Payer: Medicare HMO | Admitting: Physical Therapy

## 2020-11-27 DIAGNOSIS — M546 Pain in thoracic spine: Secondary | ICD-10-CM | POA: Diagnosis not present

## 2020-11-27 NOTE — Therapy (Signed)
[facility] [address], [place], [address] Phone: [phone]   Fax:  [phone]  Physical Therapy Treatment  Patient Details  Name: Denise Watts MRN: [id number] Date of Birth: [birth year] Referring Provider (PT): Sharmaine Base, DO   Encounter Date: 11/27/2020   PT End of Session - 11/27/20 1145     Visit Number 12    Number of Visits 17    Date for PT Re-Evaluation 01/08/21    Authorization Type [employer] Medicare HMO (VL TBD; $20 copay)    Authorization Time Period 09/22/20-11/17/20    PT Start Time 1102    PT Stop Time 1145    PT Time Calculation (min) 43 min    Activity Tolerance Patient tolerated treatment well;Patient limited by pain    Behavior During Therapy Denise Watts for tasks assessed/performed             Past Medical History:  Diagnosis Date   Anxiety    Arthritis    KNEE RIGHT   Breast cancer ([place]) 1997   right breast lumpectomy with radiation   Breast cancer ([place]) 11/15/2017   INVASIVE MAMMARY CARCINOMA/ triple negative   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   Cough 03/02/2017   INTERMTTENT DRY COUGH   Depression    GERD (gastroesophageal reflux disease)    History of hiatal hernia    Personal history of chemotherapy    Personal history of radiation therapy    Skin cancer    Throat cancer ([place]) 1998   radiation    Past Surgical History:  Procedure Laterality Date   BREAST BIOPSY Right 1997   positive   BREAST BIOPSY Right 11/15/2017   INVASIVE MAMMARY CARCINOMA triple negative   BREAST BIOPSY Right 07/20/2020   [place] bx, Q marker, path pending   BREAST LUMPECTOMY Right 1997   2019 also   BREAST LUMPECTOMY WITH SENTINEL LYMPH NODE BIOPSY Right 12/08/2017   Procedure: BREAST LUMPECTOMY WITH SENTINEL LYMPH NODE BX;  Surgeon: Robert Bellow, MD;  Location: [facility];  Service: General;  Laterality: Right;   BREAST REDUCTION WITH MASTOPEXY Left 06/10/2019   Procedure: Left breast  mastopexy reduction for symmetry;  Surgeon: Wallace Going, DO;  Location: [place];  Service: Plastics;  Laterality: Left;  total 2.5 hours   COLONOSCOPY  2015   COLONOSCOPY WITH PROPOFOL N/A 10/29/2019   Procedure: COLONOSCOPY WITH PROPOFOL;  Surgeon: Lucilla Lame, MD;  Location: [facility];  Service: Endoscopy;  Laterality: N/A;   ESOPHAGOGASTRODUODENOSCOPY (EGD) WITH PROPOFOL N/A 05/24/2019   Procedure: ESOPHAGOGASTRODUODENOSCOPY (EGD) WITH PROPOFOL;  Surgeon: Lucilla Lame, MD;  Location: [facility];  Service: Endoscopy;  Laterality: N/A;   ESOPHAGUS SURGERY     KNEE SURGERY Right    LIPOSUCTION WITH LIPOFILLING Right 06/10/2019   Procedure: right breast scar and capsule release with fat grafting;  Surgeon: Wallace Going, DO;  Location: [place];  Service: Plastics;  Laterality: Right;   PORT-A-CATH REMOVAL     PORTACATH PLACEMENT Right 01/17/2018   Procedure: INSERTION PORT-A-CATH- RIGHT;  Surgeon: Robert Bellow, MD;  Location: [facility];  Service: General;  Laterality: Right;   ROBOTIC ADRENALECTOMY Right 12/28/2016   Procedure: ROBOTIC ADRENALECTOMY;  Surgeon: Hollice Espy, MD;  Location: [facility];  Service: Urology;  Laterality: Right;   SKIN CANCER EXCISION     THROAT SURGERY  1998   throat cancer    TUBAL LIGATION     VENTRAL  HERNIA REPAIR N/A 03/03/2017   Procedure: HERNIA REPAIR VENTRAL ADULT;  Surgeon: Clayburn Pert, MD;  Location: [facility];  Service: General;  Laterality: N/A;    There were no vitals filed for this visit.   Subjective Assessment - 11/27/20 1106     Subjective Patient reports having 0/10 pain today back pain today. She states shes feeling well excpet her L shoulder is painful; feeling achy 3/10 pain.    Pertinent History Denise Watts is a 31yoF who is referred to OPPT sports rehab by plastic surgery after continued anterior bilateral thoracic. PMH: COPD, BrCA. Pt reports 2 bouts of BrCA 1997, 2019, both  of which involving the right breast, both surgically addressed, both associated with back pain and anterior trunk pain. Pt also reports a fall in 2016 in which she sustained multiple rip fractures.    How long can you sit comfortably? not limited    How long can you stand comfortably? not limited    How long can you walk comfortably? not limited              Therapeutic Exercise   Nu Step L2 x 5 min UE only   Side Lying Open books L<>R 2 x 10 reps  with 5 sec holds   Seated Forward Flexion with exercise ball 2 x 60 sec with L<>R shifting to stretch  Medicine ball OH circles with 2kg ball 2 x 30sec  UE Reaching bilat UE with 1 KG medicine ball 2 x 10 reps with cues to move slowly (replicating reaching    T spine rotation on wall 1 x 60 each side cues on pursed lip breathing and performing slowly   Supine knee drops L<>R to facilitate T-spine rotation                          PT Education - 11/27/20 1148     Education Details Pt educated on T-spine movility exercises    Person(s) Educated Patient    Methods Explanation;Demonstration;Verbal cues    Comprehension Verbalized understanding;Returned demonstration;Verbal cues required;Tactile cues required              PT Short Term Goals - 11/17/20 1143       PT SHORT TERM GOAL #1   Title After 4 weeks pt to increase FOTO score by >5 points to suggestion improved pt perception of functional ability.    Baseline 11/17/2020: increased by 1 point    Time 4    Period Weeks    Status On-going      PT SHORT TERM GOAL #2   Title After 4 weeks pt to demonstrate tolerance of seated thoracic A/ROM flexion, extension, and rotation without exacerbation of pain.    Baseline pain with active extension at eval, pain with OP in rotation and extension at eval. 11/17/20: continued pain with rotation R<>L    Time 4    Period Weeks    Status On-going               PT Long Term Goals - 11/17/20 1131        PT LONG TERM GOAL #1   Title After 8 weeks pt to demonstrate 4+/5 periscapular strength in Y and T position for heavy household ADLs    Baseline 10/02/20 3 bilat Y; 3+ bilat T; 11/03/20: 3 bilat Y; 3+ bilat T: 11/17/20: 4 Y bilat , 4 T bilat    Time 8    Period  Weeks    Status On-going      PT LONG TERM GOAL #2   Title After 8 weeks pt will demonstrate ability to lift and carry 10lb in each hand ~240f without exacerbation of pain to facilitate moving groceries into the house.    Baseline Patient able to perform task but exacerbated pain 1-2/10 on NPRS; 11/17/20; no exacerbation of pain/goal met    Time 8    Period Weeks    Status Achieved      PT LONG TERM GOAL #3   Title After 8 weeks pt will reports mild to no discomfort with palpation to the anterior and anterolateral caudal thoracic cage.    Baseline significant tenderness/pain at eval; 11/17/20: reported tenderness to anterior/anteriorlateral thoracic cage worsening    Time 8    Period Weeks    Status On-going      PT LONG TERM GOAL #4   Title Patient will increase FOTO score to 46 to demonstrate predicted increase in functional mobility to complete ADLs    Baseline 10/02/20 46; 11/17/20 47    Time 8    Period Weeks    Status On-going                   Plan - 11/27/20 1145     Clinical Impression Statement Pt tolerated session fair due to increased thoracic pain with mobility exercises. PT continued periscapular strengthening and increased focus on T spine rotation, extension, and side bending. Patient will continue to benefit from skilled PT and multimodal cueing to ensure safe and effective exercise progression.    Personal Factors and Comorbidities Age;Fitness;Education;Time since onset of injury/illness/exacerbation    Examination-Activity Limitations Lift;Transfers;Bed Mobility;Sleep;Carry    Examination-Participation Restrictions Laundry;Yard Work;Shop;Community Activity    Stability/Clinical Decision Making  Evolving/Moderate complexity    Clinical Decision Making Moderate    Rehab Potential Good    PT Frequency 2x / week    PT Duration 8 weeks    PT Treatment/Interventions ADLs/Self Care Home Management;Electrical Stimulation;Moist Heat;DME Instruction;Gait training;Functional mobility training;Therapeutic activities;Therapeutic exercise;Manual techniques;Dry needling;Patient/family education    PT Next Visit Plan Continue gentle periscapular strengthening, gentle A/ROM of T-spine. (FOTO)    PT Home Exercise Plan Posterior shoulder circles, BUE overhead reach "in the tube"    Consulted and Agree with Plan of Care Patient             Patient will benefit from skilled therapeutic intervention in order to improve the following deficits and impairments:  Cardiopulmonary status limiting activity, Decreased activity tolerance, Decreased mobility, Hypomobility, Decreased range of motion, Increased muscle spasms, Postural dysfunction, Improper body mechanics  Visit Diagnosis: Pain in thoracic spine     Problem List Patient Active Problem List   Diagnosis Date Noted   Situational stress 10/15/2020   Anxiety and depression 10/15/2020   Chronic cough 08/31/2020   Encounter for screening colonoscopy    Polyp of transverse colon    Dysphagia    Stricture and stenosis of esophagus    Cervical spondylosis 03/18/2019   [place]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   Need for prophylactic vaccination and inoculation against influenza 01/25/2019   Postoperative breast asymmetry 01/04/2019   Status post partial mastectomy 01/04/2019   Vitamin D deficiency 12/12/2018   Bursitis of left shoulder 11/21/2018   Rotator cuff tendinitis, left 11/21/2018   Genetic testing 02/01/2018   Skin cancer 01/18/2018   Goals of care, counseling/discussion 01/12/2018   B12 deficiency 12/28/2017   Malignant neoplasm of lower-outer quadrant of right  breast of female, estrogen receptor negative ([place]) 11/24/2017   Chronic  abdominal pain (Primary Area of Pain)  04/03/2017   Chronic low back pain (Secondary Area of Pain) (B) (R>L) 04/03/2017   Chronic knee pain [facility] Area of Pain) (R) 04/03/2017   Chronic pain syndrome 04/03/2017   Disorder of bone, unspecified 04/03/2017   Other specified health status 04/03/2017   Other long term (current) drug therapy 04/03/2017   Long term current use of opiate analgesic 04/03/2017   Chronic sacroiliac joint pain 04/03/2017   S/P repair of ventral hernia 03/03/2017   Incisional hernia, without obstruction or gangrene 02/13/2017   Right adrenal mass ([place]) 12/28/2016   RIGHT Adrenal Mass 12/16/2016   Fatigue 02/17/2016   Other fatigue 02/17/2016   Multiple lung nodules on CT 02/17/2016   History of right breast cancer 02/17/2016   Peripheral neuropathy 02/17/2016   Closed fracture of a rib 02/02/2015   History of fundoplication 11/20/5748   History of throat cancer 01/15/2015   Acid reflux 01/15/2015   Gonalgia 01/15/2015   Closed fracture of pubis ([place]) 01/15/2015   Pelvic fracture ([place]) 01/12/2015   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 [place], [address] Phone: [phone]   Fax:  [phone]  Name: Denise Watts MRN: [id number] Date of Birth: 09/15/1955

## 2020-12-04 ENCOUNTER — Other Ambulatory Visit: Payer: Self-pay

## 2020-12-04 ENCOUNTER — Encounter: Payer: Self-pay | Admitting: Physical Therapy

## 2020-12-04 ENCOUNTER — Ambulatory Visit: Payer: Medicare HMO | Attending: Plastic Surgery | Admitting: Physical Therapy

## 2020-12-04 DIAGNOSIS — M546 Pain in thoracic spine: Secondary | ICD-10-CM | POA: Diagnosis not present

## 2020-12-04 NOTE — Therapy (Signed)
[facility] [address], [place], [address] Phone: [phone]   Fax:  [phone]  Physical Therapy Treatment  Patient Details  Name: Denise [facility] MRN: [id number] Date of Birth: August 05, 1955 Referring Provider (PT): Sharmaine Base, DO   Encounter Date: 12/04/2020   PT End of Session - 12/04/20 0832     Visit Number 13    Number of Visits 17    Date for PT Re-Evaluation 01/08/21    Authorization Type [employer] Medicare HMO (VL TBD; $20 copay)    Authorization Time Period 09/22/20-11/17/20    PT Start Time 0806    PT Stop Time 0844    PT Time Calculation (min) 38 min    Activity Tolerance Patient tolerated treatment well    Behavior During Therapy [facility] for tasks assessed/performed             Past Medical History:  Diagnosis Date   Anxiety    Arthritis    KNEE RIGHT   Breast cancer ([place]) 1997   right breast lumpectomy with radiation   Breast cancer ([place]) 11/15/2017   INVASIVE MAMMARY CARCINOMA/ triple negative   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   Cough 03/02/2017   INTERMTTENT DRY COUGH   Depression    GERD (gastroesophageal reflux disease)    History of hiatal hernia    Personal history of chemotherapy    Personal history of radiation therapy    Skin cancer    Throat cancer ([place]) 1998   radiation    Past Surgical History:  Procedure Laterality Date   BREAST BIOPSY Right 1997   positive   BREAST BIOPSY Right 11/15/2017   INVASIVE MAMMARY CARCINOMA triple negative   BREAST BIOPSY Right 07/20/2020   [place] bx, Q marker, path pending   BREAST LUMPECTOMY Right 1997   2019 also   BREAST LUMPECTOMY WITH SENTINEL LYMPH NODE BIOPSY Right 12/08/2017   Procedure: BREAST LUMPECTOMY WITH SENTINEL LYMPH NODE BX;  Surgeon: Robert Bellow, MD;  Location: [facility];  Service: General;  Laterality: Right;   BREAST REDUCTION WITH MASTOPEXY Left 06/10/2019   Procedure: Left breast mastopexy reduction for  symmetry;  Surgeon: Wallace Going, DO;  Location: [place];  Service: Plastics;  Laterality: Left;  total 2.5 hours   COLONOSCOPY  2015   COLONOSCOPY WITH PROPOFOL N/A 10/29/2019   Procedure: COLONOSCOPY WITH PROPOFOL;  Surgeon: Lucilla Lame, MD;  Location: [facility];  Service: Endoscopy;  Laterality: N/A;   ESOPHAGOGASTRODUODENOSCOPY (EGD) WITH PROPOFOL N/A 05/24/2019   Procedure: ESOPHAGOGASTRODUODENOSCOPY (EGD) WITH PROPOFOL;  Surgeon: Lucilla Lame, MD;  Location: [facility] END[facility]COPY;  Service: Endoscopy;  Laterality: N/A;   ESOPHAGUS SURGERY     KNEE SURGERY Right    LIPOSUCTION WITH LIPOFILLING Right 06/10/2019   Procedure: right breast scar and capsule release with fat grafting;  Surgeon: Wallace Going, DO;  Location: [place];  Service: Plastics;  Laterality: Right;   PORT-A-CATH REMOVAL     PORTACATH PLACEMENT Right 01/17/2018   Procedure: INSERTION PORT-A-CATH- RIGHT;  Surgeon: Robert Bellow, MD;  Location: [facility];  Service: General;  Laterality: Right;   ROBOTIC ADRENALECTOMY Right 12/28/2016   Procedure: ROBOTIC ADRENALECTOMY;  Surgeon: Hollice Espy, MD;  Location: [facility];  Service: Urology;  Laterality: Right;   SKIN CANCER EXCISION     THROAT SURGERY  1998   throat cancer    TUBAL LIGATION     VENTRAL HERNIA REPAIR N/A  03/03/2017   Procedure: HERNIA REPAIR VENTRAL ADULT;  Surgeon: Clayburn Pert, MD;  Location: [facility];  Service: General;  Laterality: N/A;    There were no vitals filed for this visit.   Subjective Assessment - 12/04/20 0810     Subjective Patient reports 3/10 pain in back today but reports more ROM and less pain in upper back following mobility exercises. She continues to have pain in her L shoulder and bilat rib pain.    Pertinent History Denise Watts is a 63yoF who is referred to OPPT sports rehab by plastic surgery after continued anterior bilateral thoracic. PMH: COPD, BrCA. Pt reports 2 bouts of BrCA  1997, 2019, both of which involving the right breast, both surgically addressed, both associated with back pain and anterior trunk pain. Pt also reports a fall in 2016 in which she sustained multiple rip fractures.    How long can you sit comfortably? not limited    How long can you stand comfortably? not limited    How long can you walk comfortably? not limited              Therapeutic Exercise   Nu Step L2 x 5 min UE only  Standing Shoulder Press 2 x 10 reps 5#  OMEGA Bent over Row 2 x 10 reps 10#  Serratus Punches 2# bilat 2 x 10 reps with cues to keep arm straight and punch towards ceiling   Side Lying Open books L<>R 2 x 10 reps with 5 sec holds   Seated Forward Flexion with exercise ball 2 x 60 sec with L<>R shifting to stretch    T spine rotation on wall 1 x 60 each side cues on pursed lip breathing and performing slowly   Supine knee drops L<>R to facilitate T-spine rotation                         PT Education - 12/04/20 1005     Education Details Pt educated on therex form/technique    Person(s) Educated Patient    Methods Demonstration    Comprehension Verbalized understanding;Returned demonstration              PT Short Term Goals - 11/17/20 1143       PT SHORT TERM GOAL #1   Title After 4 weeks pt to increase FOTO score by >5 points to suggestion improved pt perception of functional ability.    Baseline 11/17/2020: increased by 1 point    Time 4    Period Weeks    Status On-going      PT SHORT TERM GOAL #2   Title After 4 weeks pt to demonstrate tolerance of seated thoracic A/ROM flexion, extension, and rotation without exacerbation of pain.    Baseline pain with active extension at eval, pain with OP in rotation and extension at eval. 11/17/20: continued pain with rotation R<>L    Time 4    Period Weeks    Status On-going               PT Long Term Goals - 11/17/20 1131       PT LONG TERM GOAL #1   Title After  8 weeks pt to demonstrate 4+/5 periscapular strength in Y and T position for heavy household ADLs    Baseline 10/02/20 3 bilat Y; 3+ bilat T; 11/03/20: 3 bilat Y; 3+ bilat T: 11/17/20: 4 Y bilat , 4 T bilat    Time 8  Period Weeks    Status On-going      PT LONG TERM GOAL #2   Title After 8 weeks pt will demonstrate ability to lift and carry 10lb in each hand ~237f without exacerbation of pain to facilitate moving groceries into the house.    Baseline Patient able to perform task but exacerbated pain 1-2/10 on NPRS; 11/17/20; no exacerbation of pain/goal met    Time 8    Period Weeks    Status Achieved      PT LONG TERM GOAL #3   Title After 8 weeks pt will reports mild to no discomfort with palpation to the anterior and anterolateral caudal thoracic cage.    Baseline significant tenderness/pain at eval; 11/17/20: reported tenderness to anterior/anteriorlateral thoracic cage worsening    Time 8    Period Weeks    Status On-going      PT LONG TERM GOAL #4   Title Patient will increase FOTO score to 46 to demonstrate predicted increase in functional mobility to complete ADLs    Baseline 10/02/20 46; 11/17/20 47    Time 8    Period Weeks    Status On-going                   Plan - 12/04/20 0834     Clinical Impression Statement Pt tolerated session well despite increase in thoracic pain with mobility exercises. PT continued periscapular strengthening and increased focus on T spine rotation, extension, and side bending. Patient will continue to benefit from skilled PT and multimodal cueing to ensure safe and effective exercise progression.    Personal Factors and Comorbidities Age;Fitness;Education;Time since onset of injury/illness/exacerbation    Examination-Activity Limitations Lift;Transfers;Bed Mobility;Sleep;Carry    Examination-Participation Restrictions Laundry;Yard Work;Shop;Community Activity    Stability/Clinical Decision Making Evolving/Moderate complexity    Clinical  Decision Making Moderate    Rehab Potential Good    PT Frequency 2x / week    PT Duration 8 weeks    PT Treatment/Interventions ADLs/Self Care Home Management;Electrical Stimulation;Moist Heat;DME Instruction;Gait training;Functional mobility training;Therapeutic activities;Therapeutic exercise;Manual techniques;Dry needling;Patient/family education    PT Next Visit Plan Continue gentle periscapular strengthening, gentle A/ROM of T-spine. (FOTO)    PT Home Exercise Plan Posterior shoulder circles, BUE overhead reach "in the tube"    Consulted and Agree with Plan of Care Patient             Patient will benefit from skilled therapeutic intervention in order to improve the following deficits and impairments:  Cardiopulmonary status limiting activity, Decreased activity tolerance, Decreased mobility, Hypomobility, Decreased range of motion, Increased muscle spasms, Postural dysfunction, Improper body mechanics  Visit Diagnosis: Pain in thoracic spine     Problem List Patient Active Problem List   Diagnosis Date Noted   Situational stress 10/15/2020   Anxiety and depression 10/15/2020   Chronic cough 08/31/2020   Encounter for screening colonoscopy    Polyp of transverse colon    Dysphagia    Stricture and stenosis of esophagus    Cervical spondylosis 03/18/2019   [place]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   Need for prophylactic vaccination and inoculation against influenza 01/25/2019   Postoperative breast asymmetry 01/04/2019   Status post partial mastectomy 01/04/2019   Vitamin D deficiency 12/12/2018   Bursitis of left shoulder 11/21/2018   Rotator cuff tendinitis, left 11/21/2018   Genetic testing 02/01/2018   Skin cancer 01/18/2018   Goals of care, counseling/discussion 01/12/2018   B12 deficiency 12/28/2017   Malignant neoplasm of lower-outer quadrant of  right breast of female, estrogen receptor negative ([place]) 11/24/2017   Chronic abdominal pain (Primary Area of Pain)   04/03/2017   Chronic low back pain (Secondary Area of Pain) (B) (R>L) 04/03/2017   Chronic knee pain [facility] Area of Pain) (R) 04/03/2017   Chronic pain syndrome 04/03/2017   Disorder of bone, unspecified 04/03/2017   Other specified health status 04/03/2017   Other long term (current) drug therapy 04/03/2017   Long term current use of opiate analgesic 04/03/2017   Chronic sacroiliac joint pain 04/03/2017   S/P repair of ventral hernia 03/03/2017   Incisional hernia, without obstruction or gangrene 02/13/2017   Right adrenal mass ([place]) 12/28/2016   RIGHT Adrenal Mass 12/16/2016   Fatigue 02/17/2016   Other fatigue 02/17/2016   Multiple lung nodules on CT 02/17/2016   History of right breast cancer 02/17/2016   Peripheral neuropathy 02/17/2016   Closed fracture of a rib 02/02/2015   History of fundoplication 67/59/1638   History of throat cancer 01/15/2015   Acid reflux 01/15/2015   Gonalgia 01/15/2015   Closed fracture of pubis ([place]) 01/15/2015   Pelvic fracture ([place]) 01/12/2015   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[place]) 11/10/2014   Herpes simplex virus (HSV) epithelial keratitis 03/06/2013   Nuclear sclerotic cataract 03/06/2013   Onychia of finger 10/23/2008   Odontogenic tumor 03/13/2008   Tobacco use 06/14/2007    Durwin Reges DPT Sharion Settler, SPT Durwin Reges 12/04/2020, 11:30 AM  [place] [facility] [address] [place], [address] Phone: [phone]   Fax:  [phone]  Name: Denise [facility] MRN: [id number] Date of Birth: 13-Dec-1955

## 2020-12-07 ENCOUNTER — Ambulatory Visit: Payer: Medicare HMO | Admitting: Physical Therapy

## 2020-12-07 ENCOUNTER — Encounter: Payer: Self-pay | Admitting: Physical Therapy

## 2020-12-07 DIAGNOSIS — M546 Pain in thoracic spine: Secondary | ICD-10-CM

## 2020-12-07 NOTE — Therapy (Signed)
[facility] [address] [place], [address] Phone: [phone]   Fax:  [phone]  Physical Therapy Treatment  Patient Details  Name: Denise Watts MRN: [id number] Date of Birth: March 03, 1956 Referring Provider (PT): Sharmaine Base, DO   Encounter Date: 12/07/2020   PT End of Session - 12/07/20 1517     Visit Number 14    Number of Visits 17    Date for PT Re-Evaluation 01/08/21    Authorization Type [employer] HMO (VL TBD; $20 copay)    Authorization Time Period 09/22/20-11/17/20    PT Start Time 1512    PT Stop Time 1600    PT Time Calculation (min) 48 min    Activity Tolerance Patient tolerated treatment well    Behavior During Therapy [facility] for tasks assessed/performed             Past Medical History:  Diagnosis Date   Anxiety    Arthritis    KNEE RIGHT   Breast cancer ([place]) 1997   right breast lumpectomy with radiation   Breast cancer ([place]) 11/15/2017   INVASIVE MAMMARY CARCINOMA/ triple negative   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   Cough 03/02/2017   INTERMTTENT DRY COUGH   Depression    GERD (gastroesophageal reflux disease)    History of hiatal hernia    Personal history of chemotherapy    Personal history of radiation therapy    Skin cancer    Throat cancer ([place]) 1998   radiation    Past Surgical History:  Procedure Laterality Date   BREAST BIOPSY Right 1997   positive   BREAST BIOPSY Right 11/15/2017   INVASIVE MAMMARY CARCINOMA triple negative   BREAST BIOPSY Right 07/20/2020   [place] bx, Q marker, path pending   BREAST LUMPECTOMY Right 1997   2019 also   BREAST LUMPECTOMY WITH SENTINEL LYMPH NODE BIOPSY Right 12/08/2017   Procedure: BREAST LUMPECTOMY WITH SENTINEL LYMPH NODE BX;  Surgeon: Robert Bellow, MD;  Location: [facility]S;  Service: General;  Laterality: Right;   BREAST REDUCTION WITH MASTOPEXY Left 06/10/2019   Procedure: Left breast mastopexy reduction for  symmetry;  Surgeon: Wallace Going, DO;  Location: [place];  Service: Plastics;  Laterality: Left;  total 2.5 hours   COLONOSCOPY  2015   COLONOSCOPY WITH PROPOFOL N/A 10/29/2019   Procedure: COLONOSCOPY WITH PROPOFOL;  Surgeon: Lucilla Lame, MD;  [facility];  Service: Endoscopy;  Laterality: N/A;   ESOPHAGOGASTRODUODENOSCOPY (EGD) WITH PROPOFOL N/A 05/24/2019   Procedure: ESOPHAGOGASTRODUODENOSCOPY (EGD) WITH PROPOFOL;  Surgeon: Lucilla Lame, MD;  Location: [facility] ENDOSCOPY;  Service: Endoscopy;  Laterality: N/A;   ESOPHAGUS SURGERY     KNEE SURGERY Right    LIPOSUCTION WITH LIPOFILLING Right 06/10/2019   Procedure: right breast scar and capsule release with fat grafting;  Surgeon: Wallace Going, DO;  Location: [place];  Service: Plastics;  Laterality: Right;   PORT-A-CATH REMOVAL     PORTACATH PLACEMENT Right 01/17/2018   Procedure: INSERTION PORT-A-CATH- RIGHT;  Surgeon: Robert Bellow, MD;  Location: [facility];  Service: General;  Laterality: Right;   ROBOTIC ADRENALECTOMY Right 12/28/2016   Procedure: ROBOTIC ADRENALECTOMY;  Surgeon: Hollice Espy, MD;  Location: [facility];  Service: Urology;  Laterality: Right;   SKIN CANCER EXCISION     THROAT SURGERY  1998   throat cancer    TUBAL LIGATION     VENTRAL HERNIA REPAIR N/A  03/03/2017   Procedure: HERNIA REPAIR VENTRAL ADULT;  Surgeon: Clayburn Pert, MD;  Location: [facility];  Service: General;  Laterality: N/A;    There were no vitals filed for this visit.   Subjective Assessment - 12/07/20 1514     Subjective Pt reports less than 3/10 back pain today. She reports continuing T-spine mobility exercises at home but they are more difficult when she has been doing house and yard work.    Pertinent History Denise Watts is a 42yoF who is referred to OPPT sports rehab by plastic surgery after continued anterior bilateral thoracic. PMH: COPD, BrCA. Pt reports 2 bouts of BrCA 1997,  2019, both of which involving the right breast, both surgically addressed, both associated with back pain and anterior trunk pain. Pt also reports a fall in 2016 in which she sustained multiple rip fractures.    How long can you sit comfortably? not limited    How long can you stand comfortably? not limited    How long can you walk comfortably? not limited            Therapeutic Exercise  Nu step L2 x 5 min UE only  Supine knee drops 1 x 10 reps L<>R  Supine Lat pullover #5 2 x 10 reps cueing to bend elbow and lift overhead  Shoulder Press PVC #5 2 x 10 reps   Seated Low Row RTB 2 x10 reps cueing to squeeze shoulder blades together.  Doorway Stretch 2 x 30sec   R Upper Trap Stretch 2 x 30 sec   T-Spine Rotation on wall 1 x 8 reps with 3-5 sec hold.  Manual Therapy  STM to R Upper trap                             PT Education - 12/07/20 1608     Education Details therex form/technique    Person(s) Educated Patient    Methods Explanation;Demonstration    Comprehension Verbalized understanding;Returned demonstration              PT Short Term Goals - 11/17/20 1143       PT SHORT TERM GOAL #1   Title After 4 weeks pt to increase FOTO score by >5 points to suggestion improved pt perception of functional ability.    Baseline 11/17/2020: increased by 1 point    Time 4    Period Weeks    Status On-going      PT SHORT TERM GOAL #2   Title After 4 weeks pt to demonstrate tolerance of seated thoracic A/ROM flexion, extension, and rotation without exacerbation of pain.    Baseline pain with active extension at eval, pain with OP in rotation and extension at eval. 11/17/20: continued pain with rotation R<>L    Time 4    Period Weeks    Status On-going               PT Long Term Goals - 11/17/20 1131       PT LONG TERM GOAL #1   Title After 8 weeks pt to demonstrate 4+/5 periscapular strength in Y and T position for heavy household  ADLs    Baseline 10/02/20 3 bilat Y; 3+ bilat T; 11/03/20: 3 bilat Y; 3+ bilat T: 11/17/20: 4 Y bilat , 4 T bilat    Time 8    Period Weeks    Status On-going      PT LONG TERM  GOAL #2   Title After 8 weeks pt will demonstrate ability to lift and carry 10lb in each hand ~261f without exacerbation of pain to facilitate moving groceries into the house.    Baseline Patient able to perform task but exacerbated pain 1-2/10 on NPRS; 11/17/20; no exacerbation of pain/goal met    Time 8    Period Weeks    Status Achieved      PT LONG TERM GOAL #3   Title After 8 weeks pt will reports mild to no discomfort with palpation to the anterior and anterolateral caudal thoracic cage.    Baseline significant tenderness/pain at eval; 11/17/20: reported tenderness to anterior/anteriorlateral thoracic cage worsening    Time 8    Period Weeks    Status On-going      PT LONG TERM GOAL #4   Title Patient will increase FOTO score to 46 to demonstrate predicted increase in functional mobility to complete ADLs    Baseline 10/02/20 46; 11/17/20 47    Time 8    Period Weeks    Status On-going                   Plan - 12/07/20 1601     Clinical Impression Statement PT continued perscapular and t spine mobility progression this session. Pt continues to have increased pain with rotation, decreased ROM,  decreased shoulder strength, and decreased activity tolerance. Patient will continue to benefit from skilled PT and multimodal cueing to ensure safe and effective exercise progression.    Personal Factors and Comorbidities Age;Fitness;Education;Time since onset of injury/illness/exacerbation    Examination-Activity Limitations Lift;Transfers;Bed Mobility;Sleep;Carry    Examination-Participation Restrictions Laundry;Yard Work;Shop;Community Activity    Stability/Clinical Decision Making Evolving/Moderate complexity    Clinical Decision Making Moderate    Rehab Potential Good    PT Frequency 2x / week    PT  Duration 8 weeks    PT Treatment/Interventions ADLs/Self Care Home Management;Electrical Stimulation;Moist Heat;DME Instruction;Gait training;Functional mobility training;Therapeutic activities;Therapeutic exercise;Manual techniques;Dry needling;Patient/family education    PT Next Visit Plan Continue gentle periscapular strengthening, gentle A/ROM of T-spine. (FOTO)    PT Home Exercise Plan Posterior shoulder circles, BUE overhead reach "in the tube"    Consulted and Agree with Plan of Care Patient             Patient will benefit from skilled therapeutic intervention in order to improve the following deficits and impairments:  Cardiopulmonary status limiting activity, Decreased activity tolerance, Decreased mobility, Hypomobility, Decreased range of motion, Increased muscle spasms, Postural dysfunction, Improper body mechanics  Visit Diagnosis: Pain in thoracic spine     Problem List Patient Active Problem List   Diagnosis Date Noted   Situational stress 10/15/2020   Anxiety and depression 10/15/2020   Chronic cough 08/31/2020   Encounter for screening colonoscopy    Polyp of transverse colon    Dysphagia    Stricture and stenosis of esophagus    Cervical spondylosis 03/18/2019   [place]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   Need for prophylactic vaccination and inoculation against influenza 01/25/2019   Postoperative breast asymmetry 01/04/2019   Status post partial mastectomy 01/04/2019   Vitamin D deficiency 12/12/2018   Bursitis of left shoulder 11/21/2018   Rotator cuff tendinitis, left 11/21/2018   Genetic testing 02/01/2018   Skin cancer 01/18/2018   Goals of care, counseling/discussion 01/12/2018   B12 deficiency 12/28/2017   Malignant neoplasm of lower-outer quadrant of right breast of female, estrogen receptor negative ([place] 11/24/2017   Chronic abdominal pain (  Primary Area of Pain)  04/03/2017   Chronic low back pain (Secondary Area of Pain) (B) (R>L) 04/03/2017    Chronic knee pain [facility] Area of Pain) (R) 04/03/2017   Chronic pain syndrome 04/03/2017   Disorder of bone, unspecified 04/03/2017   Other specified health status 04/03/2017   Other long term (current) drug therapy 04/03/2017   Long term current use of opiate analgesic 04/03/2017   Chronic sacroiliac joint pain 04/03/2017   S/P repair of ventral hernia 03/03/2017   Incisional hernia, without obstruction or gangrene 02/13/2017   Right adrenal mass ([place]) 12/28/2016   RIGHT Adrenal Mass 12/16/2016   Fatigue 02/17/2016   Other fatigue 02/17/2016   Multiple lung nodules on CT 02/17/2016   History of right breast cancer 02/17/2016   Peripheral neuropathy 02/17/2016   Closed fracture of a rib 02/02/2015   History of fundoplication 15/61/5379   History of throat cancer 01/15/2015   Acid reflux 01/15/2015   Gonalgia 01/15/2015   Closed fracture of pubis ([place]) 01/15/2015   Pelvic fracture ([place]) 01/12/2015   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 [place], [address] Phone: [phone]   Fax:  [phone]  Name: Denise Watts MRN: [id number] Date of Birth: 1956/02/20

## 2020-12-08 ENCOUNTER — Other Ambulatory Visit: Payer: Self-pay | Admitting: Family Medicine

## 2020-12-08 ENCOUNTER — Ambulatory Visit (INDEPENDENT_AMBULATORY_CARE_PROVIDER_SITE_OTHER): Payer: Medicare HMO | Admitting: Primary Care

## 2020-12-08 ENCOUNTER — Ambulatory Visit: Payer: Self-pay | Admitting: Urology

## 2020-12-08 ENCOUNTER — Encounter: Payer: Self-pay | Admitting: Primary Care

## 2020-12-08 ENCOUNTER — Other Ambulatory Visit: Payer: Self-pay

## 2020-12-08 DIAGNOSIS — K219 Gastro-esophageal reflux disease without esophagitis: Secondary | ICD-10-CM | POA: Diagnosis not present

## 2020-12-08 DIAGNOSIS — J432 Centrilobular emphysema: Secondary | ICD-10-CM

## 2020-12-08 DIAGNOSIS — R918 Other nonspecific abnormal finding of lung field: Secondary | ICD-10-CM | POA: Diagnosis not present

## 2020-12-08 MED ORDER — BENZONATATE 100 MG PO CAPS: 200.0000 mg | ORAL_CAPSULE | Freq: Three times a day (TID) | ORAL | 1 refills | Status: DC

## 2020-12-08 NOTE — Assessment & Plan Note (Addendum)
-   Hx GERD on PPI with residual symptoms. Reflux likely contributing to cough, advised she increased Prilosec '40mg'$  TWICE a day x 6 weeks

## 2020-12-08 NOTE — Progress Notes (Signed)
12/09/20 3:17 PM   Denise Watts 06/09/1955 [id number]  Referring provider:  Margo Common, PA-C [address] [place],  [place] [address] Chief Complaint  Patient presents with   Urinary Incontinence     HPI: Denise Watts is a 65 y.o.female who has a history of urinary incontinence who presents today for 1 month follow-up PVR.   In 2018 she underwent right robotic adrenalectomy for 5 cm adrenal mass. 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   Recent urinalysis on 11/03/2020  showed 6-10 WBC and present mucus.   She failed Gemtesa 75 mg by mouth.  She continues to have severe urgency and urge incontinence.  She states she is going to a physical therapist to work on her back she states she has been doing better. She also experiences weakness in her legs.  PMH: Past Medical History:  Diagnosis Date   Anxiety    Arthritis    KNEE RIGHT   Breast cancer ([place]) 1997   right breast lumpectomy with radiation   Breast cancer ([place]) 11/15/2017   INVASIVE MAMMARY CARCINOMA/ triple negative   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[facility]) 11/10/2014   Cough 03/02/2017   INTERMTTENT DRY COUGH   Depression    GERD (gastroesophageal reflux disease)    History of hiatal hernia    Personal history of chemotherapy    Personal history of radiation therapy    Skin cancer    Throat cancer ([place]) 1998   radiation    Surgical History: Past Surgical History:  Procedure Laterality Date   BREAST BIOPSY Right 1997   positive   BREAST BIOPSY Right 11/15/2017   INVASIVE MAMMARY CARCINOMA triple negative   BREAST BIOPSY Right 07/20/2020   [place] bx, Q marker, path pending   BREAST LUMPECTOMY Right 1997   2019 also   BREAST LUMPECTOMY WITH SENTINEL LYMPH NODE BIOPSY Right 12/08/2017   Procedure: BREAST LUMPECTOMY WITH SENTINEL LYMPH NODE BX;  Surgeon: Robert Bellow, MD;  Location: [facility];  Service: General;  Laterality: Right;    BREAST REDUCTION WITH MASTOPEXY Left 06/10/2019   Procedure: Left breast mastopexy reduction for symmetry;  Surgeon: Wallace Going, DO;  Location: [place];  Service: Plastics;  Laterality: Left;  total 2.5 hours   COLONOSCOPY  2015   COLONOSCOPY WITH PROPOFOL N/A 10/29/2019   Procedure: COLONOSCOPY WITH PROPOFOL;  Surgeon: Lucilla Lame, MD;  Location: [facility] ENDOSCOPY;  Service: Endoscopy;  Laterality: N/A;   ESOPHAGOGASTRODUODENOSCOPY (EGD) WITH PROPOFOL N/A 05/24/2019   Procedure: ESOPHAGOGASTRODUODENOSCOPY (EGD) WITH PROPOFOL;  Surgeon: Lucilla Lame, MD;  Location: [facility] ENDOSCOPY;  Service: Endoscopy;  Laterality: N/A;   ESOPHAGUS SURGERY     KNEE SURGERY Right    LIPOSUCTION WITH LIPOFILLING Right 06/10/2019   Procedure: right breast scar and capsule release with fat grafting;  Surgeon: Wallace Going, DO;  Location: [place];  Service: Plastics;  Laterality: Right;   PORT-A-CATH REMOVAL     PORTACATH PLACEMENT Right 01/17/2018   Procedure: INSERTION PORT-A-CATH- RIGHT;  Surgeon: Robert Bellow, MD;  Location: [facility];  Service: General;  Laterality: Right;   ROBOTIC ADRENALECTOMY Right 12/28/2016   Procedure: ROBOTIC ADRENALECTOMY;  Surgeon: Hollice Espy, MD;  Location: [facility];  Service: Urology;  Laterality: Right;   SKIN CANCER EXCISION     THROAT SURGERY  1998   throat cancer    TUBAL LIGATION     VENTRAL HERNIA REPAIR N/A  03/03/2017   Procedure: HERNIA REPAIR VENTRAL ADULT;  Surgeon: Clayburn Pert, MD;  Location: [facility];  Service: General;  Laterality: N/A;    Home Medications:  Allergies as of 12/09/2020       Reactions   Sulfa Antibiotics Other (See Comments)   Mouth sore and raw        Medication List        Accurate as of December 09, 2020  3:17 PM. If you have any questions, ask your nurse or doctor.          aspirin EC 81 MG tablet Take 1 tablet (81 mg total) by mouth daily. Swallow whole.   benzonatate 100  MG capsule Commonly known as: TESSALON Take 2 capsules (200 mg total) by mouth 3 (three) times daily.   Breztri Aerosphere 160-9-4.8 MCG/ACT Aero Generic drug: Budeson-Glycopyrrol-Formoterol Inhale 2 puffs into the lungs in the morning and at bedtime.   busPIRone 5 MG tablet Commonly known as: BUSPAR Take 1 tablet (5 mg total) by mouth 2 (two) times daily.   cyclobenzaprine 5 MG tablet Commonly known as: FLEXERIL TAKE 1 TABLET BY MOUTH 3 TIMES DAILY AS NEEDED FOR MUSCLE SPASMS.   DULoxetine 60 MG capsule Commonly known as: CYMBALTA Take by mouth.   fluticasone 50 MCG/ACT nasal spray Commonly known as: FLONASE Place 1 spray into both nostrils at bedtime.   gabapentin 300 MG capsule Commonly known as: NEURONTIN TAKE 1 CAPSULE BY MOUTH 3 TIMES A DAY   Gemtesa 75 MG Tabs Generic drug: Vibegron Take 75 mg by mouth daily.   omeprazole 40 MG capsule Commonly known as: PRILOSEC TAKE 1 CAPSULE BY MOUTH EVERY DAY (MORNING)   ondansetron 4 MG tablet Commonly known as: Zofran Take 1 tablet (4 mg total) by mouth every 8 (eight) hours as needed for nausea or vomiting.   rOPINIRole 0.25 MG tablet Commonly known as: REQUIP Take 1-2 tablets by mouth at bedtime for restless legs.        Allergies:  Allergies  Allergen Reactions   Sulfa Antibiotics Other (See Comments)    Mouth sore and raw    Family History: Family History  Problem Relation Age of Onset   Diabetes Mother    Heart attack Mother    Cancer Sister        cancer of eyes, spread to lung, other places. died at 43   Cancer Sister 34       unknown primary, spread to bone, brain died in 12's   Cancer Paternal Aunt        unk type   Cancer Paternal Grandmother        type unk   Prostate cancer Neg Hx    Kidney cancer Neg Hx    Bladder Cancer Neg Hx     Social History:  reports that she quit smoking about 25 years ago. Her smoking use included cigarettes. She has a 45.00 pack-year smoking history. She has  never used smokeless tobacco. She reports current alcohol use of about 4.0 standard drinks of alcohol per week. She reports that she does not use drugs.   Physical Exam: BP 128/78   Pulse (!) 106   Ht '5\' 1"'$  (1.549 m)   Wt 140 lb (63.5 kg)   BMI 26.45 kg/m   Constitutional:  Alert and oriented, No acute distress. HEENT: [place] AT, moist mucus membranes.  Trachea midline, no masses. Cardiovascular: No clubbing, cyanosis, or edema. Respiratory: Normal respiratory effort, no increased work of breathing. Skin:  No rashes, bruises or suspicious lesions. Neurologic: Grossly intact, no focal deficits, moving all 4 extremities. Psychiatric: Normal mood and affect.  Laboratory Data:  Lab Results  Component Value Date   CREATININE 0.80 10/15/2020     Assessment & Plan:    Acute urinary  incontinence  - Logan Bores has failed now prescribed Oxybutynin 15 mg 24 hr tablet.   - Follow-up with cystoscopy   -Symptoms started and have persisted after a fall   - Have ordered her a lumbar spine MRI to rule out that being the cause of symptoms  Right adrenal mass  Personal history of right adrenal mass resection, most recent imaging reviewed and no evidence of recurrence  Follow-up with lumbar spine MRI and cystoscopy.   I,Kailey Littlejohn,acting as a Education administrator for Hollice Espy, MD.,have documented all relevant documentation on the behalf of Hollice Espy, MD,as directed by  Hollice Espy, MD while in the presence of Hollice Espy, MD.  I have reviewed the above documentation for accuracy and completeness, and I agree with the above.   Hollice Espy, MD    [facility] [address], [place], [place] [address] [phone]

## 2020-12-08 NOTE — Patient Instructions (Addendum)
Recommendations: - Start Breztri Aerosphere two puffs every morning and two puffs every evening (rinse mouth after use) - Use albuterol rescue inhaler 2 puffs every 6 hours as needed only for breakthrough shortness of breath/wheezing  - Use flonase nasal spray daily  - Increased Prilosec '40mg'$  TWICE a day x 6 weeks  - If able to bring in sputum culture  Rx: Refill tesslon perles  Follow-up: 8-12 weeks with Dr. Halford Chessman or sooner if needed

## 2020-12-08 NOTE — Progress Notes (Addendum)
$'[name]$  ID: Denise Watts, female    DOB: 1955/08/06, 65 y.o.   MRN: [id number]  Chief Complaint  Patient presents with   Follow-up    Patient reports dry cough and no better.     Referring provider: Margo Common, PA-C  HPI: 65 year old female, former smoker quit 1997 (45-pack-year history).  Past medical history significant for emphysema, chronic cough, multiple lung nodules on CT, breast cancer, vocal cord cancer, depression, GERD, hiatal hernia, Parkinson's disease, osteoarthritis, anxiety, restless leg syndrome.  Patient of Dr. Halford Chessman, last seen in office on 10/28/2020 for chronic cough.  12/08/2020 Patient presents today for 6-week follow-up.  She is maintained on Breztri and as needed albuterol for emphysema with chronic cough.  During her last visit with Dr. Michela Pitcher she was given a course of Levaquin.  It was recommended that she complete sputum culture and AFB, may need to arrange bronchoscopy for more reactive airway sampling.  Pulmonary function testing has been deferred at the moment.  She needs follow-up CT chest in October 22 to monitor lung nodules.  She is finally feeling better. States that she does not feel as sick as she was. She still has a dry cough, her cough is only productive in the morning. Sputum color varies between white and yellow. She is still unable to give a sputum sample. She experiences shortness of breath with minimal exertion. Associated wheezing. She is using Breztri as needed only. She uses albuterol in the middle of the day and feels this helps.   Allergies  Allergen Reactions   Sulfa Antibiotics Other (See Comments)    Mouth sore and raw    Immunization History  Administered Date(s) Administered   Fluad Quad(high Dose 65+) 01/31/2019, 02/20/2020   Influenza,inj,Quad PF,6+ Mos 03/02/2017, 06/08/2018   PFIZER(Purple Top)SARS-COV-2 Vaccination 07/05/2019, 07/26/2019, 03/31/2020    Past Medical History:  Diagnosis Date   Anxiety    Arthritis     KNEE RIGHT   Breast cancer ([place]) 1997   right breast lumpectomy with radiation   Breast cancer ([place]) 11/15/2017   INVASIVE MAMMARY CARCINOMA/ triple negative   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[place]) 11/10/2014   Cough 03/02/2017   INTERMTTENT DRY COUGH   Depression    GERD (gastroesophageal reflux disease)    History of hiatal hernia    Personal history of chemotherapy    Personal history of radiation therapy    Skin cancer    Throat cancer ([place]) 1998   radiation    Tobacco History: Social History   Tobacco Use  Smoking Status Former   Packs/day: 1.50   Years: 30.00   Pack years: 45.00   Types: Cigarettes   Quit date: 05/03/1995   Years since quitting: 25.6  Smokeless Tobacco Never   Counseling given: Not Answered   Outpatient Medications Prior to Visit  Medication Sig Dispense Refill   aspirin EC 81 MG tablet Take 1 tablet (81 mg total) by mouth daily. Swallow whole. 90 tablet 3   Budeson-Glycopyrrol-Formoterol (BREZTRI AEROSPHERE) 160-9-4.8 MCG/ACT AERO Inhale 2 puffs into the lungs in the morning and at bedtime. 5.9 g 5   busPIRone (BUSPAR) 5 MG tablet Take 1 tablet (5 mg total) by mouth 2 (two) times daily. 60 tablet 0   cyclobenzaprine (FLEXERIL) 5 MG tablet TAKE 1 TABLET BY MOUTH 3 TIMES DAILY AS NEEDED FOR MUSCLE SPASMS. 30 tablet 0   DULoxetine (CYMBALTA) 60 MG capsule Take by mouth.     fluticasone (FLONASE) 50 MCG/ACT nasal spray Place  1 spray into both nostrils at bedtime. 16 g 6   gabapentin (NEURONTIN) 300 MG capsule TAKE 1 CAPSULE BY MOUTH 3 TIMES A DAY 60 capsule 1   omeprazole (PRILOSEC) 40 MG capsule TAKE 1 CAPSULE BY MOUTH EVERY DAY (MORNING) 90 capsule 4   ondansetron (ZOFRAN) 4 MG tablet Take 1 tablet (4 mg total) by mouth every 8 (eight) hours as needed for nausea or vomiting. 20 tablet 0   rOPINIRole (REQUIP) 0.25 MG tablet Take 1-2 tablets by mouth at bedtime for restless legs. 60 tablet 1   Vibegron (GEMTESA) 75 MG TABS Take 75 mg by mouth daily. 30 tablet 11    benzonatate (TESSALON) 200 MG capsule TAKE 1 CAPSULE BY MOUTH 3 TIMES DAILY ASNEEDED (Patient not taking: Reported on 12/08/2020) 30 capsule 0   levofloxacin (LEVAQUIN) 500 MG tablet Take 1 tablet (500 mg total) by mouth daily. (Patient not taking: Reported on 12/08/2020) 7 tablet 0   Facility-Administered Medications Prior to Visit  Medication Dose Route Frequency Provider Last Rate Last Admin   cyanocobalamin ((VITAMIN B-12)) injection 1,000 mcg  1,000 mcg Intramuscular Q30 days Sindy Guadeloupe, MD   1,000 mcg at 08/03/18 [id number]   cyanocobalamin ((VITAMIN B-12)) injection 1,000 mcg  1,000 mcg Intramuscular Q30 days Sindy Guadeloupe, MD   1,000 mcg at 09/28/18 1013   cyanocobalamin ((VITAMIN B-12)) injection 1,000 mcg  1,000 mcg Intramuscular Q30 days Sindy Guadeloupe, MD   1,000 mcg at 11/30/18 1355   cyanocobalamin ((VITAMIN B-12)) injection 1,000 mcg  1,000 mcg Intramuscular Q30 days Sindy Guadeloupe, MD   1,000 mcg at 08/16/19 1144   Review of Systems  Review of Systems  Constitutional: Negative.   HENT: Negative.    Respiratory:  Positive for cough, shortness of breath and wheezing. Negative for chest tightness.     Physical Exam  BP 118/76 (BP Location: Left Arm, Patient Position: Sitting, Cuff Size: Normal)   Pulse 87   Temp 98.7 F (37.1 C) (Oral)   Ht '5\' 1"'$  (1.549 m)   Wt 140 lb 9.6 oz (63.8 kg)   SpO2 98%   BMI 26.57 kg/m  Physical Exam Constitutional:      Appearance: Normal appearance.  HENT:     Head: Normocephalic and atraumatic.  Cardiovascular:     Rate and Rhythm: Normal rate and regular rhythm.  Pulmonary:     Effort: Pulmonary effort is normal.     Breath sounds: Normal breath sounds. No wheezing.     Comments: Faint wheezing to anterior chest wall Musculoskeletal:        General: Normal range of motion.  Skin:    General: Skin is warm and dry.  Neurological:     General: No focal deficit present.     Mental Status: She is alert and oriented to person, place, and  time. Mental status is at baseline.  Psychiatric:        Mood and Affect: Mood normal.        Behavior: Behavior normal.        Thought Content: Thought content normal.     Lab Results:  CBC    Component Value Date/Time   WBC 8.3 10/15/2020 1024   WBC 5.7 08/20/2020 1343   RBC 4.19 10/15/2020 1024   RBC 3.97 08/20/2020 1343   HGB 13.6 10/15/2020 1024   HCT 40.9 10/15/2020 1024   PLT 247 10/15/2020 1024   MCV 98 (H) 10/15/2020 1024   MCV 100 04/08/2014 0409  MCH 32.5 10/15/2020 1024   MCH 33.0 08/20/2020 1343   MCHC 33.3 10/15/2020 1024   MCHC 33.1 08/20/2020 1343   RDW 12.0 10/15/2020 1024   RDW 12.7 04/08/2014 0409   LYMPHSABS 0.9 10/15/2020 1024   LYMPHSABS 0.8 (L) 04/08/2014 0409   MONOABS 0.4 08/20/2020 1343   MONOABS 0.7 04/08/2014 0409   EOSABS 0.1 10/15/2020 1024   EOSABS 0.0 04/08/2014 0409   BASOSABS 0.0 10/15/2020 1024   BASOSABS 0.0 04/08/2014 0409    BMET    Component Value Date/Time   NA 142 10/15/2020 1024   NA 143 04/08/2014 0409   K 4.4 10/15/2020 1024   K 3.5 04/08/2014 0409   CL 101 10/15/2020 1024   CL 113 (H) 04/08/2014 0409   CO2 23 10/15/2020 1024   CO2 26 04/08/2014 0409   GLUCOSE 107 (H) 10/15/2020 1024   GLUCOSE 144 (H) 08/20/2020 1343   GLUCOSE 84 04/08/2014 0409   BUN 12 10/15/2020 1024   BUN 6 (L) 04/08/2014 0409   CREATININE 0.80 10/15/2020 1024   CREATININE 0.67 04/08/2014 0409   CALCIUM 9.5 10/15/2020 1024   CALCIUM 7.5 (L) 04/08/2014 0409   GFRNONAA >60 08/20/2020 1343   GFRNONAA >60 04/08/2014 0409   GFRNONAA >60 11/26/2012 1019   GFRAA >60 01/20/2020 1314   GFRAA >60 04/08/2014 0409   GFRAA >60 11/26/2012 1019    BNP No results found for: BNP  ProBNP No results found for: PROBNP  Imaging: No results found.   Assessment & Plan:   Centrilobular emphysema (HCC) - Moderate centrilobular emphysema with chronic cough. She reports some improvement in cough with completion of Levaquin. Cough is mostly dry, only  productive in morning. Unable to produce sputum sample for AFB culture. She does not use Breztri consistently, reinforced importance of compliance with inhaler regimen. No formal PFTs. Will discuss with Dr. Halford Chessman about need for bronchoscopy as her cough has improved.   Recommend  - Use Breztri aerosphere two puffs morning and evening with spacer  - Refill Tesslon perle '100mg'$  TID prn cough  Acid reflux - Hx GERD on PPI with residual symptoms. Reflux likely contributing to cough, advised she increased Prilosec '40mg'$  TWICE a day x 6 weeks   Addendum: Spoke with Dr. Halford Chessman who recommended holding off on bronchoscopy    Martyn Ehrich, NP 12/08/2020

## 2020-12-08 NOTE — Assessment & Plan Note (Signed)
-   CT chest from April 2022 showed emphysema and stable nodules except for new LLL 8 mm GGO. Due for repeat CT chest in October 2022.

## 2020-12-08 NOTE — Assessment & Plan Note (Addendum)
-   Moderate centrilobular emphysema with chronic cough. She reports some improvement in cough with completion of Levaquin. Cough is mostly dry, only productive in morning. Unable to produce sputum sample for AFB culture. She does not use Breztri consistently, reinforced importance of compliance with inhaler regimen. No formal PFTs. Will discuss with Dr. Halford Chessman about need for bronchoscopy as her cough has improved.   Recommend  - Use Breztri aerosphere two puffs morning and evening with spacer  - Refill Tesslon perle '100mg'$  TID prn cough

## 2020-12-08 NOTE — Telephone Encounter (Signed)
Requested medication (s) are due for refill today: expired medication  Requested medication (s) are on the active medication list: yes  Last refill:  10/26/19 #90 4 refills    Future visit scheduled: no   Notes to clinic:  expired medication , do you want to renew Rx?     Requested Prescriptions  Pending Prescriptions Disp Refills   omeprazole (PRILOSEC) 40 MG capsule [Pharmacy Med Name: OMEPRAZOLE DR 40 MG CAP] 90 capsule 4    Sig: TAKE 1 CAPSULE BY MOUTH EVERY DAY (MORNING)      Gastroenterology: Proton Pump Inhibitors Passed - 12/08/2020  5:27 PM      Passed - Valid encounter within last 12 months    Recent Outpatient Visits           1 month ago Anxiety and depression   [place], PA-C   4 months ago Upper respiratory tract infection, unspecified type   [facility], [place], [place]   5 months ago [place], [place], [place]   6 months ago Cough   [place], [place]   7 months ago COPD exacerbation [facility])   [place], Vickki Muff, [place]       Future Appointments             Tomorrow Hollice Espy, MD [place]   In 6 days [place], Aaron Edelman, MD [facility], [facility]   In 2 months Chesley Mires, MD [facility]

## 2020-12-09 ENCOUNTER — Encounter: Payer: Self-pay | Admitting: Physical Therapy

## 2020-12-09 ENCOUNTER — Ambulatory Visit: Payer: Medicare HMO | Admitting: Physical Therapy

## 2020-12-09 ENCOUNTER — Ambulatory Visit: Payer: Medicare HMO | Admitting: Urology

## 2020-12-09 VITALS — BP 128/78 | HR 106 | Ht 61.0 in | Wt 140.0 lb

## 2020-12-09 DIAGNOSIS — N3941 Urge incontinence: Secondary | ICD-10-CM

## 2020-12-09 DIAGNOSIS — M546 Pain in thoracic spine: Secondary | ICD-10-CM | POA: Diagnosis not present

## 2020-12-09 LAB — BLADDER SCAN AMB NON-IMAGING: Scan Result: 27

## 2020-12-09 MED ORDER — OXYBUTYNIN CHLORIDE ER 15 MG PO TB24: 15.0000 mg | ORAL_TABLET | Freq: Every day | ORAL | 11 refills | Status: DC

## 2020-12-09 NOTE — Progress Notes (Signed)
Reviewed and agree with assessment/plan.   Chesley Mires, MD [facility] 12/09/2020, 8:48 AM Pager:  [phone]

## 2020-12-09 NOTE — Therapy (Signed)
[place] [facility] [address], [place], [address] Phone: [phone]   Fax:  [phone]  Physical Therapy Treatment  Patient Details  Name: MAYCI HANING MRN: [id number] Date of Birth: [birth year] Referring Provider (PT): Sharmaine Base, DO   Encounter Date: 12/09/2020   PT End of Session - 12/09/20 0820     Visit Number 15    Number of Visits 17    Date for PT Re-Evaluation 01/08/21    Authorization Type [employer] HMO (VL TBD; $20 copay)    Authorization Time Period 09/22/20-11/17/20    PT Start Time 0815    PT Stop Time 0902    PT Time Calculation (min) 47 min    Activity Tolerance Patient tolerated treatment well    Behavior During Therapy [facility] for tasks assessed/performed             Past Medical History:  Diagnosis Date   Anxiety    Arthritis    KNEE RIGHT   Breast cancer ([place]) 1997   right breast lumpectomy with radiation   Breast cancer ([place]) 11/15/2017   INVASIVE MAMMARY CARCINOMA/ triple negative   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[place]) 11/10/2014   Cough 03/02/2017   INTERMTTENT DRY COUGH   Depression    GERD (gastroesophageal reflux disease)    History of hiatal hernia    Personal history of chemotherapy    Personal history of radiation therapy    Skin cancer    Throat cancer ([place]) 1998   radiation    Past Surgical History:  Procedure Laterality Date   BREAST BIOPSY Right 1997   positive   BREAST BIOPSY Right 11/15/2017   INVASIVE MAMMARY CARCINOMA triple negative   BREAST BIOPSY Right 07/20/2020   [place] bx, Q marker, path pending   BREAST LUMPECTOMY Right 1997   2019 also   BREAST LUMPECTOMY WITH SENTINEL LYMPH NODE BIOPSY Right 12/08/2017   Procedure: BREAST LUMPECTOMY WITH SENTINEL LYMPH NODE BX;  Surgeon: Robert Bellow, MD;  Location: [facility];  Service: General;  Laterality: Right;   BREAST REDUCTION WITH MASTOPEXY Left 06/10/2019   Procedure: Left breast mastopexy reduction for  symmetry;  Surgeon: Wallace Going, DO;  Location: [facility];  Service: Plastics;  Laterality: Left;  total 2.5 hours   COLONOSCOPY  2015   COLONOSCOPY WITH PROPOFOL N/A 10/29/2019   Procedure: COLONOSCOPY WITH PROPOFOL;  Surgeon: Lucilla Lame, MD;  Location: [facility] END[facility];  Service: Endoscopy;  Laterality: N/A;   ESOPHAGOGASTRODUODENOSCOPY (EGD) WITH PROPOFOL N/A 05/24/2019   Procedure: ESOPHAGOGASTRODUODENOSCOPY (EGD) WITH PROPOFOL;  Surgeon: Lucilla Lame, MD;  Location[facility];  Service: Endoscopy;  Laterality: N/A;   ESOPHAGUS SURGERY     KNEE SURGERY Right    LIPOSUCTION WITH LIPOFILLING Right 06/10/2019   Procedure: right breast scar and capsule release with fat grafting;  Surgeon: Wallace Going, DO;  Location: [facility];  Service: Plastics;  Laterality: Right;   PORT-A-CATH REMOVAL     PORTACATH PLACEMENT Right 01/17/2018   Procedure: INSERTION PORT-A-CATH- RIGHT;  Surgeon: Robert Bellow, MD;  Location: [facility];  Service: General;  Laterality: Right;   ROBOTIC ADRENALECTOMY Right 12/28/2016   Procedure: ROBOTIC ADRENALECTOMY;  Surgeon: Hollice Espy, MD;  Location: [facility];  Service: Urology;  Laterality: Right;   SKIN CANCER EXCISION     THROAT SURGERY  1998   throat cancer    TUBAL LIGATION     VENTRAL HERNIA REPAIR N/A  03/03/2017   Procedure: HERNIA REPAIR VENTRAL ADULT;  Surgeon: Clayburn Pert, MD;  Location: [facility];  Service: General;  Laterality: N/A;    There were no vitals filed for this visit.   Subjective Assessment - 12/09/20 0818     Subjective Pt reports less than 3/10 back pain today. She reports her r upper trap being a little sore from dry needling but the area is not as sensitive to touch.    Pertinent History Stpehanie Montroy is a 65yoF who is referred to OPP[facility] sports rehab by plastic surgery after continued anterior bilateral thoracic. PMH: COPD, BrCA. Pt reports 2 bouts of BrCA 1997, 2019, both of which  involving the right breast, both surgically addressed, both associated with back pain and anterior trunk pain. Pt also reports a fall in 2016 in which she sustained multiple rip fractures.    How long can you sit comfortably? not limited    How long can you stand comfortably? not limited    How long can you walk comfortably? not limited            Therapeutic Exercise  Nu step Level 2 x 5 min UE only SPM <40   TRX Snow angles 1 x 10 reps bilat cueing to drop unilateral 3-5 sec hold  Shoulder Extension YTB 3 x 10 reps with 3sec hold and squeeze   Pull aparts supinated elbows extended (RTB): 1x 10 reps cues to scap sqeeze   Pull aparts pronated with elbow extended (RTB): 1 x 10 reps cues to scap sqeeze  Omega Chest Press 2 x 8 reps #10 with cueing for pursed lip breathing    Seated FWD + L<>R leaning on theraball  2 x 5 reps cueing to breathe in on way up and out on the way down.  Manual Therapy  Seated: Thoracic segmental ext T/L junction- T2 Cervical segmental ext C/T Junction - C3   Supine: Gentle Rib Springing for increased thoracic mobility UPA from T2-T12 grade 2-3 for decreased pain and mobility                         PT Education - 12/09/20 0913     Education Details therex form/technique    Person(s) Educated Patient    Methods Explanation;Demonstration    Comprehension Verbalized understanding;Returned demonstration              PT Short Term Goals - 11/17/20 1143       PT SHORT TERM GOAL #1   Title After 4 weeks pt to increase FOTO score by >5 points to suggestion improved pt perception of functional ability.    Baseline 11/17/2020: increased by 1 point    Time 4    Period Weeks    Status On-going      PT SHORT TERM GOAL #2   Title After 4 weeks pt to demonstrate tolerance of seated thoracic A/ROM flexion, extension, and rotation without exacerbation of pain.    Baseline pain with active extension at eval, pain with OP in  rotation and extension at eval. 11/17/20: continued pain with rotation R<>L    Time 4    Period Weeks    Status On-going               PT Long Term Goals - 11/17/20 1131       PT LONG TERM GOAL #1   Title After 8 weeks pt to demonstrate 4+/5 periscapular strength in Y and T  position for heavy household ADLs    Baseline 10/02/20 3 bilat Y; 3+ bilat T; 11/03/20: 3 bilat Y; 3+ bilat T: 11/17/20: 4 Y bilat , 4 T bilat    Time 8    Period Weeks    Status On-going      PT LONG TERM GOAL #2   Title After 8 weeks pt will demonstrate ability to lift and carry 10lb in each hand ~246f without exacerbation of pain to facilitate moving groceries into the house.    Baseline Patient able to perform task but exacerbated pain 1-2/10 on NPRS; 11/17/20; no exacerbation of pain/goal met    Time 8    Period Weeks    Status Achieved      PT LONG TERM GOAL #3   Title After 8 weeks pt will reports mild to no discomfort with palpation to the anterior and anterolateral caudal thoracic cage.    Baseline significant tenderness/pain at eval; 11/17/20: reported tenderness to anterior/anteriorlateral thoracic cage worsening    Time 8    Period Weeks    Status On-going      PT LONG TERM GOAL #4   Title Patient will increase FOTO score to 46 to demonstrate predicted increase in functional mobility to complete ADLs    Baseline 10/02/20 46; 11/17/20 47    Time 8    Period Weeks    Status On-going                   Plan - 12/09/20 0905     Clinical Impression Statement Pt tolerated session well evidenced by pt reported decrease in pain following manual therapy and exercise. Pt continued therex progression of gentle periscapular strengthening and T-spine mobilty exercises. Pt continues to demonstrate thoracic and rib pain with rotation and sidebending. Patient will continue to benefit from skilled PT and multimodal cueing to ensure safe and effective exercise progression.    Personal Factors and  Comorbidities Age;Fitness;Education;Time since onset of injury/illness/exacerbation    Examination-Activity Limitations Lift;Transfers;Bed Mobility;Sleep;Carry    Examination-Participation Restrictions Laundry;Yard Work;Shop;Community Activity    Stability/Clinical Decision Making Evolving/Moderate complexity    Clinical Decision Making Moderate    Rehab Potential Good    PT Frequency 2x / week    PT Duration 8 weeks    PT Treatment/Interventions ADLs/Self Care Home Management;Electrical Stimulation;Moist Heat;DME Instruction;Gait training;Functional mobility training;Therapeutic activities;Therapeutic exercise;Manual techniques;Dry needling;Patient/family education    PT Next Visit Plan Continue gentle periscapular strengthening, gentle A/ROM of T-spine. (FOTO)    PT Home Exercise Plan Posterior shoulder circles, BUE overhead reach "in the tube"    Consulted and Agree with Plan of Care Patient             Patient will benefit from skilled therapeutic intervention in order to improve the following deficits and impairments:  Cardiopulmonary status limiting activity, Decreased activity tolerance, Decreased mobility, Hypomobility, Decreased range of motion, Increased muscle spasms, Postural dysfunction, Improper body mechanics  Visit Diagnosis: Pain in thoracic spine     Problem List Patient Active Problem List   Diagnosis Date Noted   Centrilobular emphysema ([place] 12/08/2020   Situational stress 10/15/2020   Anxiety and depression 10/15/2020   Chronic cough 08/31/2020   Encounter for screening colonoscopy    Polyp of transverse colon    Dysphagia    Stricture and stenosis of esophagus    Cervical spondylosis 03/18/2019   [place]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   Need for prophylactic vaccination and inoculation against influenza 01/25/2019   Postoperative breast asymmetry  01/04/2019   Status post partial mastectomy 01/04/2019   Vitamin D deficiency 12/12/2018   Bursitis of left  shoulder 11/21/2018   Rotator cuff tendinitis, left 11/21/2018   Genetic testing 02/01/2018   Skin cancer 01/18/2018   Goals of care, counseling/discussion 01/12/2018   B12 deficiency 12/28/2017   Malignant neoplasm of lower-outer quadrant of right breast of female, estrogen receptor negative ([place]) 11/24/2017   Chronic abdominal pain (Primary Area of Pain)  04/03/2017   Chronic low back pain (Secondary Area of Pain) (B) (R>L) 04/03/2017   Chronic knee pain (Tertiary Area of Pain) (R) 04/03/2017   Chronic pain syndrome 04/03/2017   Disorder of bone, unspecified 04/03/2017   Other specified health status 04/03/2017   Other long term (current) drug therapy 04/03/2017   Long term current use of opiate analgesic 04/03/2017   Chronic sacroiliac joint pain 04/03/2017   S/P repair of ventral hernia 03/03/2017   Incisional hernia, without obstruction or gangrene 02/13/2017   Right adrenal mass ([place]) 12/28/2016   RIGHT Adrenal Mass 12/16/2016   Fatigue 02/17/2016   Other fatigue 02/17/2016   Multiple lung nodules on CT 02/17/2016   History of right breast cancer 02/17/2016   Peripheral neuropathy 02/17/2016   Closed fracture of a rib 02/02/2015   History of fundoplication 22/29/7989   History of throat cancer 01/15/2015   Acid reflux 01/15/2015   Gonalgia 01/15/2015   Closed fracture of pubis ([place]) 01/15/2015   Pelvic fracture ([place]) 01/12/2015   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[place]) 11/10/2014   Herpes simplex virus (HSV) epithelial keratitis 03/06/2013   Nuclear sclerotic cataract 03/06/2013   Onychia of finger 10/23/2008   Odontogenic tumor 03/13/2008   Tobacco use 06/14/2007    Durwin Reges DPT Sharion Settler, SPT  Durwin Reges 12/09/2020, 11:54 AM  [place] [facility] [address], [place], [address] Phone: [phone]   Fax:  [phone]  Name: LAQUESHA HOLCOMB MRN: [id number] Date of Birth: 07-Jun-1955

## 2020-12-09 NOTE — Patient Instructions (Signed)
Cystoscopy Cystoscopy is a procedure that is used to help diagnose and sometimes treat conditions that affect the lower urinary tract. The lower urinary tract includes the bladder and the urethra. The urethra is the tube that drains urine from the bladder. Cystoscopy is done using a thin, tube-shaped instrument with a light and camera at the end (cystoscope). The cystoscope may be hard or flexible, depending on the goal of the procedure. The cystoscope is inserted through the urethra, into the bladder. Cystoscopy may be recommended if you have: Urinary tract infections that keep coming back. Blood in the urine (hematuria). An inability to control when you urinate (urinary incontinence) or an overactive bladder. Unusual cells found in a urine sample. A blockage in the urethra, such as a urinary stone. Painful urination. An abnormality in the bladder found during an intravenous pyelogram (IVP) or CT scan. Cystoscopy may also be done to remove a sample of tissue to be examined under a microscope (biopsy). What are the risks? Generally, this is a safe procedure. However, problems may occur, including: Infection. Bleeding.  What happens during the procedure?  You will be given one or more of the following: A medicine to numb the area (local anesthetic). The area around the opening of your urethra will be cleaned. The cystoscope will be passed through your urethra into your bladder. Germ-free (sterile) fluid will flow through the cystoscope to fill your bladder. The fluid will stretch your bladder so that your health care provider can clearly examine your bladder walls. Your doctor will look at the urethra and bladder. The cystoscope will be removed The procedure may vary among health care providers  What can I expect after the procedure? After the procedure, it is common to have: Some soreness or pain in your abdomen and urethra. Urinary symptoms. These include: Mild pain or burning when you  urinate. Pain should stop within a few minutes after you urinate. This may last for up to 1 week. A small amount of blood in your urine for several days. Feeling like you need to urinate but producing only a small amount of urine. Follow these instructions at home: General instructions Return to your normal activities as told by your health care provider.  Do not drive for 24 hours if you were given a sedative during your procedure. Watch for any blood in your urine. If the amount of blood in your urine increases, call your health care provider. If a tissue sample was removed for testing (biopsy) during your procedure, it is up to you to get your test results. Ask your health care provider, or the department that is doing the test, when your results will be ready. Drink enough fluid to keep your urine pale yellow. Keep all follow-up visits as told by your health care provider. This is important. Contact a health care provider if you: Have pain that gets worse or does not get better with medicine, especially pain when you urinate. Have trouble urinating. Have more blood in your urine. Get help right away if you: Have blood clots in your urine. Have abdominal pain. Have a fever or chills. Are unable to urinate. Summary Cystoscopy is a procedure that is used to help diagnose and sometimes treat conditions that affect the lower urinary tract. Cystoscopy is done using a thin, tube-shaped instrument with a light and camera at the end. After the procedure, it is common to have some soreness or pain in your abdomen and urethra. Watch for any blood in your urine.   If the amount of blood in your urine increases, call your health care provider. If you were prescribed an antibiotic medicine, take it as told by your health care provider. Do not stop taking the antibiotic even if you start to feel better. This information is not intended to replace advice given to you by your health care provider. Make  sure you discuss any questions you have with your health care provider. Document Revised: 04/10/2018 Document Reviewed: 04/10/2018 Elsevier Patient Education  2020 [employer]vier Inc[employer]  

## 2020-12-11 ENCOUNTER — Other Ambulatory Visit: Payer: Self-pay

## 2020-12-11 ENCOUNTER — Ambulatory Visit (INDEPENDENT_AMBULATORY_CARE_PROVIDER_SITE_OTHER): Payer: Medicare HMO

## 2020-12-11 DIAGNOSIS — I2510 Atherosclerotic heart disease of native coronary artery without angina pectoris: Secondary | ICD-10-CM | POA: Diagnosis not present

## 2020-12-11 DIAGNOSIS — R079 Chest pain, unspecified: Secondary | ICD-10-CM

## 2020-12-11 LAB — ECHOCARDIOGRAM COMPLETE
AR max vel: 2.02 cm2
AV Area VTI: 1.96 cm2
AV Area mean vel: 2.33 cm2
AV Mean grad: 3 mmHg
AV Peak grad: 5.3 mmHg
Ao pk vel: 1.15 m/s
Area-P 1/2: 5.16 cm2
S' Lateral: 2.3 cm
Single Plane A4C EF: 54.4 %

## 2020-12-14 ENCOUNTER — Encounter: Payer: Self-pay | Admitting: Cardiology

## 2020-12-14 ENCOUNTER — Ambulatory Visit: Payer: Medicare HMO | Admitting: Cardiology

## 2020-12-14 ENCOUNTER — Other Ambulatory Visit: Payer: Self-pay

## 2020-12-14 VITALS — BP 132/90 | HR 88 | Ht 61.0 in | Wt 142.0 lb

## 2020-12-14 DIAGNOSIS — R0600 Dyspnea, unspecified: Secondary | ICD-10-CM

## 2020-12-14 DIAGNOSIS — Z13220 Encounter for screening for lipoid disorders: Secondary | ICD-10-CM | POA: Diagnosis not present

## 2020-12-14 DIAGNOSIS — I2510 Atherosclerotic heart disease of native coronary artery without angina pectoris: Secondary | ICD-10-CM

## 2020-12-14 DIAGNOSIS — R0609 Other forms of dyspnea: Secondary | ICD-10-CM

## 2020-12-14 NOTE — Patient Instructions (Signed)
Medication Instructions:  Your physician recommends that you continue on your current medications as directed. Please refer to the Current Medication list given to you today.  *If you need a refill on your cardiac medications before your next appointment, please call your pharmacy*   Lab Work:  Fasting lipid drawn today in office.  If you have labs (blood work) drawn today and your tests are completely normal, you will receive your results only by: [place] (if you have MyChart) OR A paper copy in the mail If you have any lab test that is abnormal or we need to change your treatment, we will call you to review the results.   Testing/Procedures: None ordered   Follow-Up: At [facility], you and your health needs are our priority.  As part of our continuing mission to provide you with exceptional heart care, we have created designated Provider Care Teams.  These Care Teams include your primary Cardiologist (physician) and Advanced Practice Providers (APPs -  Physician Assistants and Nurse Practitioners) who all work together to provide you with the care you need, when you need it.  We recommend signing up for the patient portal called "MyChart".  Sign up information is provided on this After Visit Summary.  MyChart is used to connect with patients for Virtual Visits (Telemedicine).  Patients are able to view lab/test results, encounter notes, upcoming appointments, etc.  Non-urgent messages can be sent to your provider as well.   To learn more about what you can do with MyChart, go to [email or web address].    Your next appointment:   6 month(s)  The format for your next appointment:   In Person  Provider:   You may see Dr. Garen Lah or one of the following Advanced Practice Providers on your designated Care Team:   Murray Hodgkins, NP Christell Faith, PA-C Marrianne Mood, PA-C Cadence Kathlen Mody, [place]   Other Instructions

## 2020-12-14 NOTE — Progress Notes (Signed)
Cardiology Office Note:    Date:  12/14/2020   ID:  Denise Watts, DOB 1955/07/18, MRN [id number]  PCP:  Margo Common, PA-C   [place] Providers Cardiologist:  None     Referring MD: Margo Common, PA-C   Chief Complaint  Patient presents with   Other    8 week follow up. Meds reviewed verbally with patient.     History of Present Illness:    Denise Watts is a 65 y.o. 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    Previously seen due to aortic atherosclerosis in the aorta and calcifications in the proximal LAD.  Echocardiogram and Lexiscan Myoview were performed.  Was originally advised to obtain lipid panel, this is pending.  She feels well, occasionally has chest discomfort, still has shortness of breath with exertion.  Uses inhalers, for shortness of breath.  Prior notes She states having chest discomfort with exertion, also endorses shortness of breath.  Her mother passed from heart attack in her 75s.   Past Medical History:  Diagnosis Date   Anxiety    Arthritis    KNEE RIGHT   Breast cancer ([place]) 1997   right breast lumpectomy with radiation   Breast cancer ([place]) 11/15/2017   INVASIVE MAMMARY CARCINOMA/ triple negative   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[facility]) 11/10/2014   Cough 03/02/2017   INTERMTTENT DRY COUGH   Depression    GERD (gastroesophageal reflux disease)    History of hiatal hernia    Personal history of chemotherapy    Personal history of radiation therapy    Skin cancer    Throat cancer ([place]) 1998   radiation    Past Surgical History:  Procedure Laterality Date   BREAST BIOPSY Right 1997   positive   BREAST BIOPSY Right 11/15/2017   INVASIVE MAMMARY CARCINOMA triple negative   BREAST BIOPSY Right 07/20/2020   [place] bx, Q marker, path pending   BREAST LUMPECTOMY Right 1997   2019 also   BREAST LUMPECTOMY WITH SENTINEL LYMPH NODE BIOPSY Right  12/08/2017   Procedure: BREAST LUMPECTOMY WITH SENTINEL LYMPH NODE BX;  Surgeon: Robert Bellow, MD;  Location: [facility] ORS;  Service: General;  Laterality: Right;   BREAST REDUCTION WITH MASTOPEXY Left 06/10/2019   Procedure: Left breast mastopexy reduction for symmetry;  Surgeon: Wallace Going, DO;  Location: [place];  Service: Plastics;  Laterality: Left;  total 2.5 hours   COLONOSCOPY  2015   COLONOSCOPY WITH PROPOFOL N/A 10/29/2019   Procedure: COLONOSCOPY WITH PROPOFOL;  Surgeon: Lucilla Lame, MD;  Location[facility];  Service: Endoscopy;  Laterality: N/A;   ESOPHAGOGASTRODUODENOSCOPY (EGD) WITH PROPOFOL N/A 05/24/2019   Procedure: ESOPHAGOGASTRODUODENOSCOPY (EGD) WITH PROPOFOL;  Surgeon: Lucilla Lame, MD;  Location: [facility] ENDOSCOPY;  Service: Endoscopy;  Laterality: N/A;   ESOPHAGUS SURGERY     KNEE SURGERY Right    LIPOSUCTION WITH LIPOFILLING Right 06/10/2019   Procedure: right breast scar and capsule release with fat grafting;  Surgeon: Wallace Going, DO;  Location: [place];  Service: Plastics;  Laterality: Right;   PORT-A-CATH REMOVAL     PORTACATH PLACEMENT Right 01/17/2018   Procedure: INSERTION PORT-A-CATH- RIGHT;  Surgeon: Robert Bellow, MD;  Location: [facility] OR[facility];  Service: General;  Laterality: Right;   ROBOTIC ADRENALECTOMY Right 12/28/2016   Procedure: ROBOTIC ADRENALECTOMY;  Surgeon: Hollice Espy, MD;  Location: [facility] ORS;  Service: Urology;  Laterality: Right;   SKIN CANCER EXCISION     THROAT SURGERY  1998   throat cancer    TUBAL LIGATION     VENTRAL HERNIA REPAIR N/A 03/03/2017   Procedure: HERNIA REPAIR VENTRAL ADULT;  Surgeon: Clayburn Pert, MD;  Location: [facility] ORS;  Service: General;  Laterality: N/A;    Current Medications: Current Meds  Medication Sig   aspirin EC 81 MG tablet Take 1 tablet (81 mg total) by mouth daily. Swallow whole.   benzonatate (TESSALON) 100 MG capsule Take 2 capsules (200 mg total) by  mouth 3 (three) times daily.   Budeson-Glycopyrrol-Formoterol (BREZTRI AEROSPHERE) 160-9-4.8 MCG/ACT AERO Inhale 2 puffs into the lungs in the morning and at bedtime.   busPIRone (BUSPAR) 5 MG tablet Take 1 tablet (5 mg total) by mouth 2 (two) times daily.   cyclobenzaprine (FLEXERIL) 5 MG tablet TAKE 1 TABLET BY MOUTH 3 TIMES DAILY AS NEEDED FOR MUSCLE SPASMS.   DULoxetine (CYMBALTA) 60 MG capsule Take by mouth.   fluticasone (FLONASE) 50 MCG/ACT nasal spray Place 1 spray into both nostrils at bedtime.   gabapentin (NEURONTIN) 300 MG capsule TAKE 1 CAPSULE BY MOUTH 3 TIMES A DAY   omeprazole (PRILOSEC) 40 MG capsule Take 40 mg by mouth in the morning and at bedtime.   ondansetron (ZOFRAN) 4 MG tablet Take 1 tablet (4 mg total) by mouth every 8 (eight) hours as needed for nausea or vomiting.   oxybutynin (DITROPAN XL) 15 MG 24 hr tablet Take 1 tablet (15 mg total) by mouth daily.   rOPINIRole (REQUIP) 0.25 MG tablet Take 1-2 tablets by mouth at bedtime for restless legs.     Allergies:   Sulfa antibiotics   Social History   Socioeconomic History   Marital status: Married    Spouse name: Not on file   Number of children: 4   Years of education: Not on file   Highest education level: 10th grade  Occupational History   Occupation: retired  Tobacco Use   Smoking status: Former    Packs/day: 1.50    Years: 30.00    Pack years: 45.00    Types: Cigarettes    Quit date: 05/03/1995    Years since quitting: 25.6   Smokeless tobacco: Never  Vaping Use   Vaping Use: Never used  Substance and Sexual Activity   Alcohol use: Yes    Alcohol/week: 4.0 standard drinks    Types: 4 Shots of liquor per week    Comment: 2 per night on weekends   Drug use: No   Sexual activity: Not on file  Other Topics Concern   Not on file  Social History Narrative   Not on file   Social Determinants of Health   Financial Resource Strain: Low Risk    Difficulty of Paying Living Expenses: Not hard at all   Food Insecurity: No Food Insecurity   Worried About Charity fundraiser in the Last Year: Never true   [place] in the Last Year: Never true  Transportation Needs: No Transportation Needs   Lack of Transportation (Medical): No   Lack of Transportation (Non-Medical): No  Physical Activity: Insufficiently Active   Days of Exercise per Week: 3 days   Minutes of Exercise per Session: 30 min  Stress: Stress Concern Present   Feeling of Stress : Rather much  Social Connections: Moderately Isolated   Frequency of Communication with Friends and Family: More than  three times a week   Frequency of Social Gatherings with Friends and Family: More than three times a week   Attends Religious Services: Never   Marine scientist or Organizations: No   Attends Music therapist: Never   Marital Status: Married     Family History: The patient's family history includes Cancer in her paternal aunt, paternal grandmother, and sister; Cancer (age of onset: 41) in her sister; Diabetes in her mother; Heart attack in her mother. There is no history of Prostate cancer, Kidney cancer, or Bladder Cancer.  ROS:   Please see the history of present illness.     All other systems reviewed and are negative.  EKGs/Labs/Other Studies Reviewed:    The following studies were reviewed today:   EKG:  EKG not ordered today.   Recent Labs: 08/20/2020: ALT 18 10/15/2020: BUN 12; Creatinine, Ser 0.80; Hemoglobin 13.6; Magnesium 2.1; Platelets 247; Potassium 4.4; Sodium 142  Recent Lipid Panel No results found for: CHOL, TRIG, HDL, CHOLHDL, VLDL, LDLCALC, LDLDIRECT   Risk Assessment/Calculations:          Physical Exam:    VS:  BP 132/90 (BP Location: Left Arm, Patient Position: Sitting, Cuff Size: Normal)   Pulse 88   Ht '5\' 1"'$  (1.549 m)   Wt 142 lb (64.4 kg)   SpO2 98%   BMI 26.83 kg/m     Wt Readings from Last 3 Encounters:  12/14/20 142 lb (64.4 kg)  12/09/20 140 lb (63.5 kg)   12/08/20 140 lb 9.6 oz (63.8 kg)     GEN:  Well nourished, well developed in no acute distress HEENT: Normal NECK: No JVD; No carotid bruits LYMPHATICS: No lymphadenopathy CARDIAC: RRR, no murmurs, rubs, gallops RESPIRATORY:  Clear to auscultation without rales, wheezing or rhonchi  ABDOMEN: Soft, non-tender, non-distended MUSCULOSKELETAL:  No edema; No deformity  SKIN: Warm and dry NEUROLOGIC:  Alert and oriented x 3 PSYCHIATRIC:  Normal affect   ASSESSMENT:    1. Coronary artery disease involving native coronary artery of native heart without angina pectoris   2. Dyspnea on exertion   3. Screening for hyperlipidemia     PLAN:    In order of problems listed above:  CAD, coronary artery calcifications noted in the distal left main/proximal LAD on chest CT. echo showed normal systolic function, EF 60 to 65%.  Impaired relaxation.  Myoview with no evidence for ischemia.   Etiology for shortness of breath could be pulmonary in etiology with moderate centrilobular emphysema noted on chest CT. on inhalers.  Further management as per PCP. Patient advised to obtain fasting lipid profile.  Based on results, consider statin dose.  Follow-up in 6 months.    Medication Adjustments/Labs and Tests Ordered: Current medicines are reviewed at length with the patient today.  Concerns regarding medicines are outlined above.  Orders Placed This Encounter  Procedures   Lipid panel     No orders of the defined types were placed in this encounter.    Patient Instructions  Medication Instructions:  Your physician recommends that you continue on your current medications as directed. Please refer to the Current Medication list given to you today.  *If you need a refill on your cardiac medications before your next appointment, please call your pharmacy*   Lab Work:  Fasting lipid drawn today in office.  If you have labs (blood work) drawn today and your tests are completely normal,  you will receive your results only by: [employer] (  if you have MyChart) OR A paper copy in the mail If you have any lab test that is abnormal or we need to change your treatment, we will call you to review the results.   Testing/Procedures: None ordered   Follow-Up: At [facility], you and your health needs are our priority.  As part of our continuing mission to provide you with exceptional heart care, we have created designated Provider Care Teams.  These Care Teams include your primary Cardiologist (physician) and Advanced Practice Providers (APPs -  Physician Assistants and Nurse Practitioners) who all work together to provide you with the care you need, when you need it.  We recommend signing up for the patient portal called "MyChart".  Sign up information is provided on this After Visit Summary.  MyChart is used to connect with patients for Virtual Visits (Telemedicine).  Patients are able to view lab/test results, encounter notes, upcoming appointments, etc.  Non-urgent messages can be sent to your provider as well.   To learn more about what you can do with MyChart, go to [email or web address].    Your next appointment:   6 month(s)  The format for your next appointment:   In Person  Provider:   You may see Dr. Garen Lah or one of the following Advanced Practice Providers on your designated Care Team:   Murray Hodgkins, NP Christell Faith, PA-C Marrianne Mood, PA-C Cadence [place], [place]   Other Instructions    Signed, Kate Sable, MD  12/14/2020 4:45 PM    [place]

## 2020-12-15 ENCOUNTER — Telehealth: Payer: Self-pay

## 2020-12-15 DIAGNOSIS — E785 Hyperlipidemia, unspecified: Secondary | ICD-10-CM

## 2020-12-15 LAB — LIPID PANEL
Chol/HDL Ratio: 5 ratio — ABNORMAL HIGH (ref 0.0–4.4)
Cholesterol, Total: 236 mg/dL — ABNORMAL HIGH (ref 100–199)
HDL: 47 mg/dL (ref >39)
LDL Chol Calc (NIH): 155 mg/dL — ABNORMAL HIGH (ref 0–99)
Triglycerides: 186 mg/dL — ABNORMAL HIGH (ref 0–149)
VLDL Cholesterol Cal: 34 mg/dL (ref 5–40)

## 2020-12-15 MED ORDER — ATORVASTATIN CALCIUM 40 MG PO TABS: 40.0000 mg | ORAL_TABLET | Freq: Every day | ORAL | 6 refills | Status: DC

## 2020-12-15 NOTE — Telephone Encounter (Signed)
-----   Message from Kate Sable, MD sent at 12/15/2020 11:04 AM EDT ----- Cholesterol is elevated, start Lipitor 40 mg daily.  Repeat fasting lipid profile in 3 months.

## 2020-12-15 NOTE — Telephone Encounter (Signed)
Called patient and left her a detailed VM per DPR on file, regarding the below recommendations.  Prescription sent in and lab order placed. Encouraged patient to call back with any questions or concerns.

## 2020-12-16 DIAGNOSIS — Z20828 Contact with and (suspected) exposure to other viral communicable diseases: Secondary | ICD-10-CM | POA: Diagnosis not present

## 2020-12-18 ENCOUNTER — Encounter: Payer: Self-pay | Admitting: Physical Therapy

## 2020-12-18 ENCOUNTER — Other Ambulatory Visit: Payer: Self-pay

## 2020-12-18 ENCOUNTER — Ambulatory Visit: Payer: Medicare HMO | Admitting: Physical Therapy

## 2020-12-18 DIAGNOSIS — M546 Pain in thoracic spine: Secondary | ICD-10-CM | POA: Diagnosis not present

## 2020-12-18 NOTE — Therapy (Signed)
[facility] [address] [place], [address] Phone: [phone]   Fax:  [phone]  Physical Therapy Treatment  Patient Details  Name: Denise Watts MRN: [id number] Date of Birth: May 02, 1956 Referring Provider (PT): Sharmaine Base, DO   Encounter Date: 12/18/2020   PT End of Session - 12/18/20 0932     Visit Number 16    Number of Visits 17    Date for PT Re-Evaluation 01/08/21    Authorization Type [employer] HMO (VL TBD; $20 copay)    Authorization Time Period 09/22/20-11/17/20    PT Start Time 0847    PT Stop Time 0932    PT Time Calculation (min) 45 min    Activity Tolerance Patient tolerated treatment well    Behavior During Therapy 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   Cough 03/02/2017   INTERMTTENT DRY COUGH   Depression    GERD (gastroesophageal reflux disease)    History of hiatal hernia    Personal history of chemotherapy    Personal history of radiation therapy    Skin cancer    Throat cancer ([place]) 1998   radiation    Past Surgical History:  Procedure Laterality Date   BREAST BIOPSY Right 1997   positive   BREAST BIOPSY Right 11/15/2017   INVASIVE MAMMARY CARCINOMA triple negative   BREAST BIOPSY Right 07/20/2020   [place] bx, Q marker, path pending   BREAST LUMPECTOMY Right 1997   2019 also   BREAST LUMPECTOMY WITH SENTINEL LYMPH NODE BIOPSY Right 12/08/2017   Procedure: BREAST LUMPECTOMY WITH SENTINEL LYMPH NODE BX;  Surgeon: Robert Bellow, MD;  Location: [facility];  Service: General;  Laterality: Right;   BREAST REDUCTION WITH MASTOPEXY Left 06/10/2019   Procedure: Left breast mastopexy reduction for  symmetry;  Surgeon: Wallace Going, DO;  Location: [facility];  Service: Plastics;  Laterality: Left;  total 2.5 hours   COLONOSCOPY  2015   COLONOSCOPY WITH PROPOFOL N/A 10/29/2019   Procedure: COLONOSCOPY WITH PROPOFOL;  Surgeon: Lucilla Lame, MD;  Location: [facility]OSCOPY;  Service: Endoscopy;  Laterality: N/A;   ESOPHAGOGASTRODUODENOSCOPY (EGD) WITH PROPOFOL N/A 05/24/2019   Procedure: ESOPHAGOGASTRODUODENOSCOPY (EGD) WITH PROPOFOL;  Surgeon: Lucilla Lame, MD;  Location: [facility] ENDOSCOPY;  Service: Endoscopy;  Laterality: N/A;   ESOPHAGUS SURGERY     KNEE SURGERY Right    LIPOSUCTION WITH LIPOFILLING Right 06/10/2019   Procedure: right breast scar and capsule release with fat grafting;  Surgeon: Wallace Going, DO;  Location: [place];  Service: Plastics;  Laterality: Right;   PORT-A-CATH REMOVAL     PORTACATH PLACEMENT Right 01/17/2018   Procedure: INSERTION PORT-A-CATH- RIGHT;  Surgeon: Robert Bellow, MD;  Location: [facility];  Service: General;  Laterality: Right;   ROBOTIC ADRENALECTOMY Right 12/28/2016   Procedure: ROBOTIC ADRENALECTOMY;  Surgeon: Hollice Espy, MD;  Location: [facility];  Service: Urology;  Laterality: Right;   SKIN CANCER EXCISION     THROAT SURGERY  1998   throat cancer    TUBAL LIGATION     VENTRAL HERNIA REPAIR N/A  03/03/2017   Procedure: HERNIA REPAIR VENTRAL ADULT;  Surgeon: Clayburn Pert, MD;  Location: [facility];  Service: General;  Laterality: N/A;    There were no vitals filed for this visit.   Subjective Assessment - 12/18/20 0852     Subjective Pt reports her back upper back feeling much better but she has a "catch" in her R lower back and hip. She is unable to rate her thoracic pain due to her LBP.    Pertinent History Denise Watts is a 29yoF who is referred to OPPT sports rehab by plastic surgery after continued anterior bilateral thoracic. PMH: COPD, BrCA. Pt reports 2 bouts of BrCA 1997, 2019, both of which  involving the right breast, both surgically addressed, both associated with back pain and anterior trunk pain. Pt also reports a fall in 2016 in which she sustained multiple rip fractures.    How long can you sit comfortably? not limited    How long can you stand comfortably? not limited    How long can you walk comfortably? not limited              Therapeutic Exercise  Nu step L2 x 5 min UE only  Y foam rolls on wall 2 x 12 reps for strengthening of shoulders and thoracic extension  Standing shoulder extension 2 x 10 reps 3 sec hold   Standing Y raises yellow Tband 2 x 8 reps cues to keep arm straight and squeeze shoulder blades  TRX Snow angles 1 x 15 reps bilat with emphasis on lateral flexion and rotation  Physioball rollouts 1 x 10 reps FWD/L<>R with emphasis on increasing mobility   T-spine extenision on [place] with towel roll 1 x 20 reps from T3-T-12;           PT Education - 12/18/20 0855     Education Details Pt educated on POC and d/c next session.    Person(s) Educated Patient    Methods Explanation    Comprehension Verbalized understanding              PT Short Term Goals - 11/17/20 1143       PT SHORT TERM GOAL #1   Title After 4 weeks pt to increase FOTO score by >5 points to suggestion improved pt perception of functional ability.    Baseline 11/17/2020: increased by 1 point    Time 4    Period Weeks    Status On-going      PT SHORT TERM GOAL #2   Title After 4 weeks pt to demonstrate tolerance of seated thoracic A/ROM flexion, extension, and rotation without exacerbation of pain.    Baseline pain with active extension at eval, pain with OP in rotation and extension at eval. 11/17/20: continued pain with rotation R<>L    Time 4    Period Weeks    Status On-going               PT Long Term Goals - 11/17/20 1131       PT LONG TERM GOAL #1   Title After 8 weeks pt to demonstrate 4+/5 periscapular strength in Y and T position for  heavy household ADLs    Baseline 10/02/20 3 bilat Y; 3+ bilat T; 11/03/20: 3 bilat Y; 3+ bilat T: 11/17/20: 4 Y bilat , 4 T bilat    Time 8    Period Weeks    Status On-going      PT LONG TERM GOAL #2  Title After 8 weeks pt will demonstrate ability to lift and carry 10lb in each hand ~22f without exacerbation of pain to facilitate moving groceries into the house.    Baseline Patient able to perform task but exacerbated pain 1-2/10 on NPRS; 11/17/20; no exacerbation of pain/goal met    Time 8    Period Weeks    Status Achieved      PT LONG TERM GOAL #3   Title After 8 weeks pt will reports mild to no discomfort with palpation to the anterior and anterolateral caudal thoracic cage.    Baseline significant tenderness/pain at eval; 11/17/20: reported tenderness to anterior/anteriorlateral thoracic cage worsening    Time 8    Period Weeks    Status On-going      PT LONG TERM GOAL #4   Title Patient will increase FOTO score to 46 to demonstrate predicted increase in functional mobility to complete ADLs    Baseline 10/02/20 46; 11/17/20 47    Time 8    Period Weeks    Status On-going                   Plan - 12/18/20 0941     Clinical Impression Statement Pt tolerated session well with no reports of increased pain throughout session. PT continued therex progression of gentle periscapular strengthening and T-spine mobility. Pt demonstrating and reporting improvement in rib/thoracic pain. Pt educated on PT POC and plan to d/c next visit.    Personal Factors and Comorbidities Age;Fitness;Education;Time since onset of injury/illness/exacerbation    Examination-Activity Limitations Lift;Transfers;Bed Mobility;Sleep;Carry    Examination-Participation Restrictions Laundry;Yard Work;Shop;Community Activity    Rehab Potential Good    PT Frequency 2x / week    PT Duration 8 weeks    PT Treatment/Interventions ADLs/Self Care Home Management;Electrical Stimulation;Moist Heat;DME  Instruction;Gait training;Functional mobility training;Therapeutic activities;Therapeutic exercise;Manual techniques;Dry needling;Patient/family education    PT Next Visit Plan Continue gentle periscapular strengthening, gentle A/ROM of T-spine. (FOTO)    PT Home Exercise Plan Posterior shoulder circles, BUE overhead reach "in the tube"    Consulted and Agree with Plan of Care Patient             Patient will benefit from skilled therapeutic intervention in order to improve the following deficits and impairments:  Cardiopulmonary status limiting activity, Decreased activity tolerance, Decreased mobility, Hypomobility, Decreased range of motion, Increased muscle spasms, Postural dysfunction, Improper body mechanics  Visit Diagnosis: Pain in thoracic spine     Problem List Patient Active Problem List   Diagnosis Date Noted   Centrilobular emphysema ([place] 12/08/2020   Situational stress 10/15/2020   Anxiety and depression 10/15/2020   Chronic cough 08/31/2020   Encounter for screening colonoscopy    Polyp of transverse colon    Dysphagia    Stricture and stenosis of esophagus    Cervical spondylosis 03/18/2019   [place]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   Need for prophylactic vaccination and inoculation against influenza 01/25/2019   Postoperative breast asymmetry 01/04/2019   Status post partial mastectomy 01/04/2019   Vitamin D deficiency 12/12/2018   Bursitis of left shoulder 11/21/2018   Rotator cuff tendinitis, left 11/21/2018   Genetic testing 02/01/2018   Skin cancer 01/18/2018   Goals of care, counseling/discussion 01/12/2018   B12 deficiency 12/28/2017   Malignant neoplasm of lower-outer quadrant of right breast of female, estrogen receptor negative ([place] 11/24/2017   Chronic abdominal pain (Primary Area of Pain)  04/03/2017   Chronic low back pain (Secondary Area of Pain) (B) (  R>L) 04/03/2017   Chronic knee pain [facility] Area of Pain) (R) 04/03/2017   Chronic pain  syndrome 04/03/2017   Disorder of bone, unspecified 04/03/2017   Other specified health status 04/03/2017   Other long term (current) drug therapy 04/03/2017   Long term current use of opiate analgesic 04/03/2017   Chronic sacroiliac joint pain 04/03/2017   S/P repair of ventral hernia 03/03/2017   Incisional hernia, without obstruction or gangrene 02/13/2017   Right adrenal mass ([place]) 12/28/2016   RIGHT Adrenal Mass 12/16/2016   Fatigue 02/17/2016   Other fatigue 02/17/2016   Multiple lung nodules on CT 02/17/2016   History of right breast cancer 02/17/2016   Peripheral neuropathy 02/17/2016   Closed fracture of a rib 02/02/2015   History of fundoplication 45/40/9811   History of throat cancer 01/15/2015   Acid reflux 01/15/2015   Gonalgia 01/15/2015   Closed fracture of pubis ([place]) 01/15/2015   Pelvic fracture ([place]) 01/12/2015   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 [place], [address] Phone: [phone]   Fax:  [phone]  Name: Denise Watts MRN: [id number] Date of Birth: [birth year]

## 2020-12-21 ENCOUNTER — Ambulatory Visit: Payer: Self-pay | Admitting: *Deleted

## 2020-12-21 ENCOUNTER — Other Ambulatory Visit: Payer: Self-pay | Admitting: Family Medicine

## 2020-12-21 NOTE — Telephone Encounter (Signed)
Medications refilled. Schedule appointment on 01-07-21.

## 2020-12-21 NOTE — Telephone Encounter (Signed)
Requested medication (s) are due for refill today: yes  Requested medication (s) are on the active medication list:  yes   Last refill:  11/23/2020  Future visit scheduled: no  Notes to clinic:  this refill cannot be delegated    Requested Prescriptions  Pending Prescriptions Disp Refills   cyclobenzaprine (FLEXERIL) 5 MG tablet [Pharmacy Med Name: CYCLOBENZAPRINE HCL 5 MG TAB] 30 tablet 0    Sig: TAKE 1 TABLET BY MOUTH 3 TIMES DAILY AS NEEDED FOR MUSCLE SPASMS.     Not Delegated - Analgesics:  Muscle Relaxants Failed - 12/21/2020  1:25 PM      Failed - This refill cannot be delegated      Passed - Valid encounter within last 6 months    Recent Outpatient Visits           2 months ago Anxiety and depression   [place], PA-C   5 months ago Upper respiratory tract infection, unspecified type   [facility], [place], [place]   6 months ago [place], [place], [place]   6 months ago Cough   [place], [place]   8 months ago COPD exacerbation [facility])   [place], Vickki Muff, PA-C       Future Appointments             In 1 month Chesley Mires, MD [facility]   In 5 months Ag[place], Aaron Edelman, MD [facility], [place]

## 2020-12-21 NOTE — Telephone Encounter (Signed)
Patient calls with lower back pain including shooting pain thru right buttock and down the right leg. She is currently in rehab for upper back pain (Thoracic spine)at this time and will see PT again on Thursday 12/24/20.  Care advice including gentle streches, mild heat to the area and request refill for Flexeril prescribed by pcp for muscle spasms last ordered on 11/23/20 30# tabs to be taken 3x day prn. with zero refills.No availability with pcp within disposition of 3 days.    Reason for Disposition  [1] Pain radiates into the thigh or further down the leg AND [2] one leg  Answer Assessment - Initial Assessment Questions 1. ONSET: "When did the pain begin?"      One week ago 2. LOCATION: "Where does it hurt?" (upper, mid or lower back)     Lower back in the center of buttocks, right side 3. SEVERITY: "How bad is the pain?"  (e.g., Scale 1-10; mild, moderate, or severe)   - MILD (1-3): doesn't interfere with normal activities    - MODERATE (4-7): interferes with normal activities or awakens from sleep    - SEVERE (8-10): excruciating pain, unable to do any normal activities      10 4. PATTERN: "Is the pain constant?" (e.g., yes, no; constant, intermittent)      constant 5. RADIATION: "Does the pain shoot into your legs or elsewhere?"     Down the right leg 6. CAUSE:  "What do you think is causing the back pain?"      unsure 7. BACK OVERUSE:  "Any recent lifting of heavy objects, strenuous work or exercise?"     Only rehab with back and shoulders 8. MEDICATIONS: "What have you taken so far for the pain?" (e.g., nothing, acetaminophen, NSAIDS)     rehab 9. NEUROLOGIC SYMPTOMS: "Do you have any weakness, numbness, or problems with bowel/bladder control?"     Yes but nothing new. 10. OTHER SYMPTOMS: "Do you have any other symptoms?" (e.g., fever, abdominal pain, burning with urination, blood in urine)      Urgency with voiding but on medication for this 11. PREGNANCY: "Is there any chance  you are pregnant?" (e.g., yes, no; LMP)       na  Protocols used: Back Pain-A-AH

## 2020-12-22 NOTE — Telephone Encounter (Signed)
Patient advised and appointment made 

## 2020-12-24 ENCOUNTER — Other Ambulatory Visit: Payer: Self-pay

## 2020-12-24 ENCOUNTER — Ambulatory Visit: Payer: Medicare HMO | Admitting: Physical Therapy

## 2020-12-24 DIAGNOSIS — M546 Pain in thoracic spine: Secondary | ICD-10-CM

## 2020-12-24 NOTE — Therapy (Signed)
[facility] [address] [place], [address] Phone: [phone]   Fax:  [phone]  Physical Therapy Treatment Discharge Summary Reporting Period 11/17/20-12/24/20  Patient Details  Name: Denise Watts MRN: [id number] Date of Birth: November 05, 1955 Referring Provider (PT): Sharmaine Base, DO   Encounter Date: 12/24/2020   PT End of Session - 12/24/20 1552     Visit Number 17    Number of Visits 17    Date for PT Re-Evaluation 01/08/21    Authorization Type [employer] HMO (VL TBD; $20 copay)    Authorization Time Period 09/22/20-11/17/20    PT Start Time 1521    PT Stop Time 1559    PT Time Calculation (min) 38 min    Activity Tolerance Patient tolerated treatment well    Behavior During Therapy [facility] for tasks assessed/performed             Past Medical History:  Diagnosis Date   Anxiety    Arthritis    KNEE RIGHT   Breast cancer ([place]) 1997   right breast lumpectomy with radiation   Breast cancer ([place]) 11/15/2017   INVASIVE MAMMARY CARCINOMA/ triple negative   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   Cough 03/02/2017   INTERMTTENT DRY COUGH   Depression    GERD (gastroesophageal reflux disease)    History of hiatal hernia    Personal history of chemotherapy    Personal history of radiation therapy    Skin cancer    Throat cancer ([place]) 1998   radiation    Past Surgical History:  Procedure Laterality Date   BREAST BIOPSY Right 1997   positive   BREAST BIOPSY Right 11/15/2017   INVASIVE MAMMARY CARCINOMA triple negative   BREAST BIOPSY Right 07/20/2020   [place] bx, Q marker, path pending   BREAST LUMPECTOMY Right 1997   2019 also   BREAST LUMPECTOMY WITH SENTINEL LYMPH NODE BIOPSY Right 12/08/2017   Procedure: BREAST LUMPECTOMY WITH SENTINEL LYMPH NODE BX;  Surgeon: Robert Bellow, MD;  Location: [facility] ORS;  Service: General;  Laterality: Right;   BREAST REDUCTION WITH MASTOPEXY Left  06/10/2019   Procedure: Left breast mastopexy reduction for symmetry;  Surgeon: Wallace Going, DO;  Location: [place];  Service: Plastics;  Laterality: Left;  total 2.5 hours   COLONOSCOPY  2015   COLONOSCOPY WITH PROPOFOL N/A 10/29/2019   Procedure: COLONOSCOPY WITH PROPOFOL;  Surgeon: Lucilla Lame, MD;  Location: [facility] ENDOSCOPY;  Service: Endoscopy;  Laterality: N/A;   ESOPHAGOGASTRODUODENOSCOPY (EGD) WITH PROPOFOL N/A 05/24/2019   Procedure: ESOPHAGOGASTRODUODENOSCOPY (EGD) WITH PROPOFOL;  Surgeon: Lucilla Lame, MD;  Location: [facility] ENDOSCOPY;  Service: Endoscopy;  Laterality: N/A;   ESOPHAGUS SURGERY     KNEE SURGERY Right    LIPOSUCTION WITH LIPOFILLING Right 06/10/2019   Procedure: right breast scar and capsule release with fat grafting;  Surgeon: Wallace Going, DO;  Location: [place];  Service: Plastics;  Laterality: Right;   PORT-A-CATH REMOVAL     PORTACATH PLACEMENT Right 01/17/2018   Procedure: INSERTION PORT-A-CATH- RIGHT;  Surgeon: Robert Bellow, MD;  Location: [facility] ORS;  Service: General;  Laterality: Right;   ROBOTIC ADRENALECTOMY Right 12/28/2016   Procedure: ROBOTIC ADRENALECTOMY;  Surgeon: Hollice Espy, MD;  Location: [facility] ORS;  Service: Urology;  Laterality: Right;   SKIN CANCER EXCISION     THROAT SURGERY  1998   throat cancer    TUBAL LIGATION  VENTRAL HERNIA REPAIR N/A 03/03/2017   Procedure: HERNIA REPAIR VENTRAL ADULT;  Surgeon: Clayburn Pert, MD;  Location: [facility] ORS;  Service: General;  Laterality: N/A;    There were no vitals filed for this visit.   Subjective Assessment - 12/24/20 1527     Subjective Pt reports her back upper back continues to feel much better but her R lower back and hip are causing her a lot of pain. She  her LBP rating it as 8/10 and is having difficulty walking. She said she injured her back getting out of a chair and twisting.    Pertinent History Denise Watts is a 89yoF who is referred  to OPPT sports rehab by plastic surgery after continued anterior bilateral thoracic. PMH: COPD, BrCA. Pt reports 2 bouts of BrCA 1997, 2019, both of which involving the right breast, both surgically addressed, both associated with back pain and anterior trunk pain. Pt also reports a fall in 2016 in which she sustained multiple rip fractures.    How long can you sit comfortably? not limited    How long can you stand comfortably? not limited    How long can you walk comfortably? not limited              Therex  Nu Step L1 x 5 min warm up/gentle strengthening  Access Code: [email or web address] Sidelying Thoracic Rotation with Open Book - 1 x daily - 7 x weekly - 3 reps - 30 hold Standing I's, Y's, T's - 1 x daily - 3-4 x weekly - 3 sets - 10 reps Seated Lat Pull Down with Resistance - Elbows Bent - 1 x daily - 3-4 x weekly - 3 sets - 10 reps Supine Chest Press with Dumbbells - 1 x daily - 3-4 x weekly - 3 sets - 10 reps Seated Trunk Rotation - Arms Crossed - 1 x daily - 7 x weekly - 1 sets - 3 reps - 30 sec hold   PT reviewed the following HEP with patient with patient able to demonstrate a set of the following with min cuing for correction needed. PT educated patient on parameters of therex (how/when to inc/decrease intensity, frequency, rep/set range, stretch hold time, and purpose of therex) with verbalized understanding.   *Pt appropriate for D/C this visit. Pt continues to demonstrate no improvement in strength and rib/thoracic pain since last progress note. For further details view long term goals/impression statement.                   PT Education - 12/24/20 1659     Education Details Pt educated on natural history of rib pain improving with time and treatment. Pt educated/given HEP.    Person(s) Educated Patient    Methods Explanation;Demonstration    Comprehension Verbalized understanding;Returned demonstration              PT Short Term Goals - 11/17/20 1143        PT SHORT TERM GOAL #1   Title After 4 weeks pt to increase FOTO score by >5 points to suggestion improved pt perception of functional ability.    Baseline 11/17/2020: increased by 1 point    Time 4    Period Weeks    Status On-going      PT SHORT TERM GOAL #2   Title After 4 weeks pt to demonstrate tolerance of seated thoracic A/ROM flexion, extension, and rotation without exacerbation of pain.    Baseline pain with active extension at eval, pain  with OP in rotation and extension at eval. 11/17/20: continued pain with rotation R<>L    Time 4    Period Weeks    Status On-going               PT Long Term Goals - 12/24/20 1529       PT LONG TERM GOAL #1   Title After 8 weeks pt to demonstrate 4+/5 periscapular strength in Y and T position for heavy household ADLs    Baseline 10/02/20 3 bilat Y; 3+ bilat T; 11/03/20: 3 bilat Y; 3+ bilat T: 11/17/20: 4 Y bilat , 4 T bilat 8/25: 4 Y & T bilat    Time 8    Period Weeks    Status Not Met      PT LONG TERM GOAL #2   Title After 8 weeks pt will demonstrate ability to lift and carry 10lb in each hand ~255f without exacerbation of pain to facilitate moving groceries into the house.    Baseline Patient able to perform task but exacerbated pain 1-2/10 on NPRS; 11/17/20; no exacerbation of pain/goal met    Time 8    Period Weeks    Status Achieved      PT LONG TERM GOAL #3   Title After 8 weeks pt will reports mild to no discomfort with palpation to the anterior and anterolateral caudal thoracic cage.    Baseline significant tenderness/pain at eval; 11/17/20: reported tenderness to anterior/anteriorlateral thoracic cage worsening 8/25: same tenderness and pain reports as progress note    Time 8    Period Weeks    Status Not Met      PT LONG TERM GOAL #4   Title Patient will increase FOTO score to 57 to demonstrate predicted increase in functional mobility to complete ADLs    Baseline 10/02/20 46; 11/17/20 46; 8/25 47    Time 8     Period Weeks    Status Not Met                 Plan - 12/24/20 1707     Clinical Impression Statement Pt appropriate for dicharge due to no significant improvement in long term goals this session. Pt continues to have anteriorolateral rib pain with palpation and no improvement in UE strength with I, Y, T since progress note. Pt does have a new chief complaint of R LBP and R hip pain that she reports contributing to decreased weakness and ability to tolerate standing, walking, lifting. Pt educated on being beneficial to treat new concern but unable to under this episode.    Personal Factors and Comorbidities Age;Fitness;Education;Time since onset of injury/illness/exacerbation    Examination-Activity Limitations Lift;Transfers;Bed Mobility;Sleep;Carry    Examination-Participation Restrictions Laundry;Yard Work;Shop;Community Activity    Stability/Clinical Decision Making Evolving/Moderate complexity    Clinical Decision Making Moderate    Rehab Potential Good    PT Frequency 2x / week    PT Duration 8 weeks    PT Treatment/Interventions ADLs/Self Care Home Management;Electrical Stimulation;Moist Heat;DME Instruction;Gait training;Functional mobility training;Therapeutic activities;Therapeutic exercise;Manual techniques;Dry needling;Patient/family education    PT Next Visit Plan DC    PT Home Exercise Plan access code: [email or web address]   Consulted and Agree with Plan of Care Patient             Patient will benefit from skilled therapeutic intervention in order to improve the following deficits and impairments:  Cardiopulmonary status limiting activity, Decreased activity tolerance, Decreased mobility, Hypomobility, Decreased range of motion, Increased  muscle spasms, Postural dysfunction, Improper body mechanics  Visit Diagnosis: Pain in thoracic spine     Problem List Patient Active Problem List   Diagnosis Date Noted   Centrilobular emphysema ([place]) 12/08/2020   Situational  stress 10/15/2020   Anxiety and depression 10/15/2020   Chronic cough 08/31/2020   Encounter for screening colonoscopy    Polyp of transverse colon    Dysphagia    Stricture and stenosis of esophagus    Cervical spondylosis 03/18/2019   [place]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   Need for prophylactic vaccination and inoculation against influenza 01/25/2019   Postoperative breast asymmetry 01/04/2019   Status post partial mastectomy 01/04/2019   Vitamin D deficiency 12/12/2018   Bursitis of left shoulder 11/21/2018   Rotator cuff tendinitis, left 11/21/2018   Genetic testing 02/01/2018   Skin cancer 01/18/2018   Goals of care, counseling/discussion 01/12/2018   B12 deficiency 12/28/2017   Malignant neoplasm of lower-outer quadrant of right breast of female, estrogen receptor negative ([place]) 11/24/2017   Chronic abdominal pain (Primary Area of Pain)  04/03/2017   Chronic low back pain (Secondary Area of Pain) (B) (R>L) 04/03/2017   Chronic knee pain (Tertiary Area of Pain) (R) 04/03/2017   Chronic pain syndrome 04/03/2017   Disorder of bone, unspecified 04/03/2017   Other specified health status 04/03/2017   Other long term (current) drug therapy 04/03/2017   Long term current use of opiate analgesic 04/03/2017   Chronic sacroiliac joint pain 04/03/2017   S/P repair of ventral hernia 03/03/2017   Incisional hernia, without obstruction or gangrene 02/13/2017   Right adrenal mass ([place]) 12/28/2016   RIGHT Adrenal Mass 12/16/2016   Fatigue 02/17/2016   Other fatigue 02/17/2016   Multiple lung nodules on CT 02/17/2016   History of right breast cancer 02/17/2016   Peripheral neuropathy 02/17/2016   Closed fracture of a rib 02/02/2015   History of fundoplication 01/77/9390   History of throat cancer 01/15/2015   Acid reflux 01/15/2015   Gonalgia 01/15/2015   Closed fracture of pubis ([place]) 01/15/2015   Pelvic fracture ([place]) 01/12/2015   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, [place], [address] Phone: [phone]   Fax:  [phone]  Name: KIERRE DEINES MRN: [id number] Date of Birth: 26-Sep-1955

## 2020-12-28 DIAGNOSIS — C44722 Squamous cell carcinoma of skin of right lower limb, including hip: Secondary | ICD-10-CM | POA: Diagnosis not present

## 2020-12-28 DIAGNOSIS — D485 Neoplasm of uncertain behavior of skin: Secondary | ICD-10-CM | POA: Diagnosis not present

## 2020-12-28 DIAGNOSIS — D2262 Melanocytic nevi of left upper limb, including shoulder: Secondary | ICD-10-CM | POA: Diagnosis not present

## 2020-12-28 DIAGNOSIS — Z85828 Personal history of other malignant neoplasm of skin: Secondary | ICD-10-CM | POA: Diagnosis not present

## 2020-12-28 DIAGNOSIS — D2271 Melanocytic nevi of right lower limb, including hip: Secondary | ICD-10-CM | POA: Diagnosis not present

## 2020-12-28 DIAGNOSIS — D2261 Melanocytic nevi of right upper limb, including shoulder: Secondary | ICD-10-CM | POA: Diagnosis not present

## 2020-12-28 DIAGNOSIS — D225 Melanocytic nevi of trunk: Secondary | ICD-10-CM | POA: Diagnosis not present

## 2020-12-28 DIAGNOSIS — X32XXXA Exposure to sunlight, initial encounter: Secondary | ICD-10-CM | POA: Diagnosis not present

## 2020-12-28 DIAGNOSIS — D0471 Carcinoma in situ of skin of right lower limb, including hip: Secondary | ICD-10-CM | POA: Diagnosis not present

## 2020-12-28 DIAGNOSIS — L570 Actinic keratosis: Secondary | ICD-10-CM | POA: Diagnosis not present

## 2020-12-29 ENCOUNTER — Other Ambulatory Visit: Payer: Self-pay

## 2020-12-29 ENCOUNTER — Ambulatory Visit
Admission: RE | Admit: 2020-12-29 | Discharge: 2020-12-29 | Disposition: A | Discharge status: Home / Self Care | Payer: Medicare HMO | Source: Ambulatory Visit | Attending: Urology | Admitting: Urology

## 2020-12-29 DIAGNOSIS — N3941 Urge incontinence: Secondary | ICD-10-CM | POA: Insufficient documentation

## 2020-12-29 DIAGNOSIS — M545 Low back pain, unspecified: Secondary | ICD-10-CM | POA: Diagnosis not present

## 2020-12-30 ENCOUNTER — Ambulatory Visit: Payer: Medicare HMO | Admitting: Physical Therapy

## 2020-12-31 ENCOUNTER — Encounter: Payer: Self-pay | Admitting: Urology

## 2021-01-01 ENCOUNTER — Telehealth: Payer: Self-pay

## 2021-01-01 NOTE — Telephone Encounter (Signed)
Sw patient. She is having back pain on the right side. She denies any fever, chills, hematuria, difficulty urinating. Advised pt to f.u with her pcp. As per Dr. Erlene Quan her MRI results show minor nerve impingement and would like to follow up with her pcp for further evaluation. Pt aware.

## 2021-01-04 NOTE — Progress Notes (Signed)
   01/05/21  CC:  Chief Complaint  Patient presents with   Cysto     HPI: Denise Watts is a 65 y.o.female with a personal history of urinary incontinence who presents today for cystoscopy and MRI results.   OAB symptoms improved on oxybutynin 15 mg XL.  Continues to have severe back pain primarily rating down her right leg.  Given that her urinary symptoms started immediately after fall, she did have a lumbar MRI as outlined below.  12/29/2020 MRI revealed symptomatic level with regard to right side radiculopathy appears to be L4-L5, where there is a small right foraminal disc extrusion with 8 mm sequestered disc fragment. Moderate right foraminal stenosis, query Right L4 radiculitis. And 2. No acute osseous abnormality and capacious spinal canal with no spinal stenosis. Leftward disc degeneration at L2-L3 and L3-L4 results in mild if any left side stenosis.   Urinalysis today had no blood and was negative.    Vitals:   01/05/21 1632  BP: 125/87  Pulse: (!) 103  NED. A&Ox3.   No respiratory distress   Abd soft, NT, ND Normal external genitalia with patent urethral meatus  Cystoscopy Procedure Note  Patient identification was confirmed, informed consent was obtained, and patient was prepped using Betadine solution.  Lidocaine jelly was administered per urethral meatus.    Procedure: - Flexible cystoscope introduced, without any difficulty.   - Thorough search of the bladder revealed:    normal urethral meatus    normal urothelium    no stones    no ulcers     no tumors    no urethral polyps    no trabeculation  - Ureteral orifices were normal in position and appearance.  Post-Procedure: - Patient tolerated the procedure well  Assessment/ Plan:  Acute urinary incontinence  - Continue oxybutynin 15 mg XL  - Cystoscopy today  unremarkable which is reassuring, no underlying bladder pathology  2. Foraminal stenosis of lumbar region - Ambulatory referral to Spine  Surgery - Referral to a ortho-spine doctor.    Symptom recheck / PVR in 3 months   I,Kailey Littlejohn,acting as a scribe for Hollice Espy, MD.,have documented all relevant documentation on the behalf of Hollice Espy, MD,as directed by  Hollice Espy, MD while in the presence of Hollice Espy, MD.  I have reviewed the above documentation for accuracy and completeness, and I agree with the above.   Hollice Espy, MD

## 2021-01-05 ENCOUNTER — Ambulatory Visit (INDEPENDENT_AMBULATORY_CARE_PROVIDER_SITE_OTHER): Payer: Medicare HMO | Admitting: Urology

## 2021-01-05 ENCOUNTER — Encounter: Payer: Self-pay | Admitting: Urology

## 2021-01-05 ENCOUNTER — Other Ambulatory Visit: Payer: Self-pay

## 2021-01-05 VITALS — BP 125/87 | HR 103

## 2021-01-05 DIAGNOSIS — N393 Stress incontinence (female) (male): Secondary | ICD-10-CM

## 2021-01-05 DIAGNOSIS — M48061 Spinal stenosis, lumbar region without neurogenic claudication: Secondary | ICD-10-CM

## 2021-01-06 ENCOUNTER — Other Ambulatory Visit: Payer: Self-pay | Admitting: Urology

## 2021-01-06 LAB — URINALYSIS, COMPLETE
Bilirubin, UA: NEGATIVE
Glucose, UA: NEGATIVE
Ketones, UA: NEGATIVE
Leukocytes,UA: NEGATIVE
Nitrite, UA: NEGATIVE
Protein,UA: NEGATIVE
RBC, UA: NEGATIVE
Specific Gravity, UA: >1.03 — ABNORMAL HIGH (ref 1.005–1.030)
Urobilinogen, Ur: 0.2 mg/dL (ref 0.2–1.0)
pH, UA: 5.5 (ref 5.0–7.5)

## 2021-01-06 LAB — MICROSCOPIC EXAMINATION: Bacteria, UA: NONE SEEN

## 2021-01-07 ENCOUNTER — Ambulatory Visit: Payer: Medicare HMO | Admitting: Family Medicine

## 2021-01-12 ENCOUNTER — Other Ambulatory Visit: Payer: Self-pay

## 2021-01-12 ENCOUNTER — Ambulatory Visit (INDEPENDENT_AMBULATORY_CARE_PROVIDER_SITE_OTHER): Payer: Medicare HMO | Admitting: Family Medicine

## 2021-01-12 ENCOUNTER — Encounter: Payer: Self-pay | Admitting: Family Medicine

## 2021-01-12 VITALS — BP 139/85 | HR 88 | Resp 16 | Wt 142.0 lb

## 2021-01-12 DIAGNOSIS — M5416 Radiculopathy, lumbar region: Secondary | ICD-10-CM

## 2021-01-12 DIAGNOSIS — C50511 Malignant neoplasm of lower-outer quadrant of right female breast: Secondary | ICD-10-CM | POA: Diagnosis not present

## 2021-01-12 DIAGNOSIS — M79604 Pain in right leg: Secondary | ICD-10-CM

## 2021-01-12 DIAGNOSIS — F419 Anxiety disorder, unspecified: Secondary | ICD-10-CM

## 2021-01-12 DIAGNOSIS — F32A Depression, unspecified: Secondary | ICD-10-CM

## 2021-01-12 DIAGNOSIS — Z171 Estrogen receptor negative status [ER-]: Secondary | ICD-10-CM

## 2021-01-12 MED ORDER — HYDROCODONE-ACETAMINOPHEN 5-325 MG PO TABS: 1.0000 | ORAL_TABLET | ORAL | 0 refills | Status: DC | PRN

## 2021-01-12 MED ORDER — PREDNISONE 5 MG PO TABS: ORAL_TABLET | ORAL | 0 refills | Status: DC

## 2021-01-12 NOTE — Progress Notes (Signed)
I,April Miller,acting as a scribe for Wilhemena Durie, MD.,have documented all relevant documentation on the behalf of Wilhemena Durie, MD,as directed by  Wilhemena Durie, MD while in the presence of Wilhemena Durie, MD.   Established patient visit   Patient: Denise Watts   DOB: Jun 30, 1955   65 y.o. Female  MRN: [id number] Visit Date: 01/12/2021  Today's healthcare provider: Wilhemena Durie, MD   Chief Complaint  Patient presents with   Back Pain   Fall   Subjective    Back Pain This is a new problem. The current episode started 1 to 4 weeks ago (1 1/2 weeks ago). The problem occurs constantly. The problem has been waxing and waning since onset. The pain is present in the lumbar spine and gluteal. The quality of the pain is described as stabbing. The pain radiates to the right thigh, right knee and right foot. The pain is at a severity of 8/10. The pain is severe. The pain is The same all the time. The symptoms are aggravated by bending, position, sitting, standing, twisting and lying down. Stiffness is present All day. Associated symptoms include leg pain. Pertinent negatives include no abdominal pain, bladder incontinence, bowel incontinence, chest pain, dysuria, fever, headaches, numbness, paresis, paresthesias, pelvic pain, perianal numbness, tingling, weakness or weight loss. Risk factors include recent trauma. She has tried heat for the symptoms. The treatment provided mild relief.  Fall The accident occurred More than 1 week ago (1 week ago). Fall occurred: trying to get on a golf cart. She fell from a height of 3 to 5 ft. She landed on [place]. There was no blood loss. The point of impact was the buttocks. The pain is present in the buttocks, back, right foot, right lower leg, right upper leg, right knee and right hip. The pain is at a severity of 8/10. The pain is severe. The symptoms are aggravated by extension, movement, rotation, sitting and standing. Pertinent  negatives include no abdominal pain, bowel incontinence, fever, headaches, hearing loss, hematuria, loss of consciousness, nausea, numbness, tingling, visual change or vomiting. She has tried heat for the symptoms. The treatment provided mild relief.    Patient states she has had right sided back pain for 1 1/2 weeks. Patient states she was sitting and twisted the wrong way when she got up. Patient states after she twisted her back, she began to have pain in right buttock, hip, leg and foot. Patient had a fall 1 week ago, when she was getting on a golf cart. Patient fell on the ground, on her right buttock. Patient states she is in constant pain. She has been treating pain with heating pad.  MRI reveals L4-5 disc. She has trouble finding a position that is comfortable.  Medications: Outpatient Medications Prior to Visit  Medication Sig   aspirin EC 81 MG tablet Take 1 tablet (81 mg total) by mouth daily. Swallow whole.   atorvastatin (LIPITOR) 40 MG tablet Take 1 tablet (40 mg total) by mouth daily.   benzonatate (TESSALON) 100 MG capsule Take 2 capsules (200 mg total) by mouth 3 (three) times daily.   Budeson-Glycopyrrol-Formoterol (BREZTRI AEROSPHERE) 160-9-4.8 MCG/ACT AERO Inhale 2 puffs into the lungs in the morning and at bedtime.   busPIRone (BUSPAR) 5 MG tablet Take 1 tablet (5 mg total) by mouth 2 (two) times daily.   cyclobenzaprine (FLEXERIL) 5 MG tablet TAKE 1 TABLET BY MOUTH 3 TIMES DAILY AS NEEDED FOR MUSCLE SPASMS  DULoxetine (CYMBALTA) 60 MG capsule Take by mouth.   fluticasone (FLONASE) 50 MCG/ACT nasal spray Place 1 spray into both nostrils at bedtime.   gabapentin (NEURONTIN) 300 MG capsule TAKE 1 CAPSULE BY MOUTH 3 TIMES A DAY   omeprazole (PRILOSEC) 40 MG capsule Take 40 mg by mouth in the morning and at bedtime.   ondansetron (ZOFRAN) 4 MG tablet Take 1 tablet (4 mg total) by mouth every 8 (eight) hours as needed for nausea or vomiting.   oxybutynin (DITROPAN XL) 15 MG 24 hr  tablet Take 1 tablet (15 mg total) by mouth daily.   rOPINIRole (REQUIP) 0.25 MG tablet Take 1-2 tablets by mouth at bedtime for restless legs.   Facility-Administered Medications Prior to Visit  Medication Dose Route Frequency Provider   cyanocobalamin ((VITAMIN B-12)) injection 1,000 mcg  1,000 mcg Intramuscular Q30 days Sindy Guadeloupe, MD   cyanocobalamin ((VITAMIN B-12)) injection 1,000 mcg  1,000 mcg Intramuscular Q30 days Sindy Guadeloupe, MD   cyanocobalamin ((VITAMIN B-12)) injection 1,000 mcg  1,000 mcg Intramuscular Q30 days Sindy Guadeloupe, MD   cyanocobalamin ((VITAMIN B-12)) injection 1,000 mcg  1,000 mcg Intramuscular Q30 days Sindy Guadeloupe, MD    Review of Systems  Constitutional:  Negative for fever and weight loss.  Cardiovascular:  Negative for chest pain.  Gastrointestinal:  Negative for abdominal pain, bowel incontinence, nausea and vomiting.  Genitourinary:  Negative for bladder incontinence, dysuria, hematuria and pelvic pain.  Musculoskeletal:  Positive for back pain.  Neurological:  Negative for tingling, loss of consciousness, weakness, numbness, headaches and paresthesias.       Objective    BP 139/85 (BP Location: Right Arm, Patient Position: Sitting, Cuff Size: Normal)   Pulse 88   Resp 16   Wt 142 lb (64.4 kg)   SpO2 95%   BMI 26.83 kg/m  BP Readings from Last 3 Encounters:  01/12/21 139/85  01/05/21 125/87  12/14/20 132/90   Wt Readings from Last 3 Encounters:  01/12/21 142 lb (64.4 kg)  12/14/20 142 lb (64.4 kg)  12/09/20 140 lb (63.5 kg)      Physical Exam Vitals reviewed.  Constitutional:      General: She is not in acute distress.    Appearance: Normal appearance. She is well-developed, well-groomed and overweight. She is not diaphoretic.  HENT:     Head: Normocephalic and atraumatic.  Cardiovascular:     Rate and Rhythm: Normal rate and regular rhythm.     Heart sounds: Normal heart sounds. No murmur heard.   No friction rub. No  gallop.  Pulmonary:     Effort: Pulmonary effort is normal. No respiratory distress.     Breath sounds: Normal breath sounds. No wheezing or rales.  Abdominal:     General: There is no distension.     Palpations: Abdomen is soft. There is no mass.     Tenderness: There is no right CVA tenderness, left CVA tenderness, guarding or rebound.  Skin:    General: Skin is warm and dry.  Neurological:     General: No focal deficit present.     Mental Status: She is alert and oriented to person, place, and time.     Motor: No weakness.     Deep Tendon Reflexes: Reflexes normal.     Comments: Anything both lower extremities is normal.  There is no skin rash.  DTRs are 3+ but symmetrical  Psychiatric:        Mood and Affect: Mood  normal.        Behavior: Behavior normal. Behavior is cooperative.        Thought Content: Thought content normal.        Judgment: Judgment normal.      No results found for any visits on 01/12/21.  Assessment & Plan     1. Lumbar radiculopathy Treat with prednisone and Norco and refer to neurosurgery. - predniSONE (DELTASONE) 5 MG tablet; Take 6 Tablets For 2 Days, 5 Tablets For 2 Days, 4 Tablets For 2 Days, 3 Tablets For 2 Days, 2 Tablets For 2 Days, 1 Tablet For 2 Days,  Dispense: 42 tablet; Refill: 0 - Ambulatory referral to Neurosurgery - HYDROcodone-acetaminophen (NORCO/VICODIN) 5-325 MG tablet; Take 1 tablet by mouth every 4 (four) hours as needed for moderate pain.  Dispense: 35 tablet; Refill: 0  2. Pain of right lower extremity   3. Malignant neoplasm of lower-outer quadrant of right breast of female, estrogen receptor negative ([place]) [place] breast cancer  4. Anxiety and depression History of depression.  And anxiety.  Patient is perfectly appropriate today.   No follow-ups on file.      I, Wilhemena Durie, MD, have reviewed all documentation for this visit. The documentation on 01/15/21 for the exam, diagnosis, procedures, and orders are all  accurate and complete.    Ailea Rhatigan Cranford Mon, MD  [facility] [phone] (phone) [phone] (fax)  [place]

## 2021-01-18 ENCOUNTER — Telehealth: Payer: Self-pay | Admitting: Family Medicine

## 2021-01-18 ENCOUNTER — Ambulatory Visit: Payer: Medicare HMO | Attending: Plastic Surgery

## 2021-01-18 ENCOUNTER — Other Ambulatory Visit: Payer: Self-pay | Admitting: Family Medicine

## 2021-01-18 DIAGNOSIS — G2581 Restless legs syndrome: Secondary | ICD-10-CM

## 2021-01-18 DIAGNOSIS — M6281 Muscle weakness (generalized): Secondary | ICD-10-CM | POA: Diagnosis not present

## 2021-01-18 DIAGNOSIS — M25551 Pain in right hip: Secondary | ICD-10-CM | POA: Diagnosis not present

## 2021-01-18 NOTE — Telephone Encounter (Signed)
Pt is calling to request if Dr. Rosanna Randy can become her new PCP? Please advise CB- [phone]

## 2021-01-18 NOTE — Telephone Encounter (Signed)
Pt advised.   Thanks,   -Rossi Burdo  

## 2021-01-18 NOTE — Telephone Encounter (Signed)
Please review

## 2021-01-18 NOTE — Progress Notes (Signed)
Patient canceled appointment and rescheduled. 1. Erroneous encounter - disregard

## 2021-01-19 ENCOUNTER — Other Ambulatory Visit: Payer: Self-pay | Admitting: Family Medicine

## 2021-01-19 DIAGNOSIS — G2581 Restless legs syndrome: Secondary | ICD-10-CM

## 2021-01-19 NOTE — Therapy (Signed)
[facility] [address] [place], [address] Phone: [phone]   Fax:  [phone]  Physical Therapy Evaluation  Patient Details  Name: Denise Watts MRN: [id number] Date of Birth: [birth year] Referring Provider (PT): Vernie Murders [place]   Encounter Date: 01/18/2021   PT End of Session - 01/19/21 0801     Visit Number 1    Number of Visits 17    Date for PT Re-Evaluation 03/15/21    Authorization - Visit Number 1    Authorization - Number of Visits 10    PT Start Time 1300    PT Stop Time 8588    PT Time Calculation (min) 45 min             Past Medical History:  Diagnosis Date   Anxiety    Arthritis    KNEE RIGHT   Breast cancer ([place]) 1997   right breast lumpectomy with radiation   Breast cancer ([place]) 11/15/2017   INVASIVE MAMMARY CARCINOMA/ triple negative   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   Cough 03/02/2017   INTERMTTENT DRY COUGH   Depression    GERD (gastroesophageal reflux disease)    History of hiatal hernia    Personal history of chemotherapy    Personal history of radiation therapy    Skin cancer    Throat cancer ([place]) 1998   radiation    Past Surgical History:  Procedure Laterality Date   BREAST BIOPSY Right 1997   positive   BREAST BIOPSY Right 11/15/2017   INVASIVE MAMMARY CARCINOMA triple negative   BREAST BIOPSY Right 07/20/2020   [place] bx, Q marker, path pending   BREAST LUMPECTOMY Right 1997   2019 also   BREAST LUMPECTOMY WITH SENTINEL LYMPH NODE BIOPSY Right 12/08/2017   Procedure: BREAST LUMPECTOMY WITH SENTINEL LYMPH NODE BX;  Surgeon: Robert Bellow, MD;  Location: [facility];  Service: General;  Laterality: Right;   BREAST REDUCTION WITH MASTOPEXY Left 06/10/2019   Procedure: Left breast mastopexy reduction for symmetry;  Surgeon: Wallace Going, DO;  Location: [facility];  Service: Plastics;  Laterality: Left;  total 2.5 hours    COLONOSCOPY  2015   COLONOSCOPY WITH PROPOFOL N/A 10/29/2019   Procedure: COLONOSCOPY WITH PROPOFOL;  Surgeon: Lucilla Lame, MD;  Location: [facility];  Service: Endoscopy;  Laterality: N/A;   ESOPHAGOGASTRODUODENOSCOPY (EGD) WITH PROPOFOL N/A 05/24/2019   Procedure: ESOPHAGOGASTRODUODENOSCOPY (EGD) WITH PROPOFOL;  Surgeon: Lucilla Lame, MD;  Location: [facility] END[facility]COPY;  Service: Endoscopy;  Laterality: N/A;   ESOPHAGUS SURGERY     KNEE SURGERY Right    LIPOSUCTION WITH LIPOFILLING Right 06/10/2019   Procedure: right breast scar and capsule release with fat grafting;  Surgeon: Wallace Going, DO;  Location: [facility];  Service: Plastics;  Laterality: Right;   PORT-A-CATH REMOVAL     PORTACATH PLACEMENT Right 01/17/2018   Procedure: INSERTION PORT-A-CATH- RIGHT;  Surgeon: Robert Bellow, MD;  Location: [facility];  Service: General;  Laterality: Right;   ROBOTIC ADRENALECTOMY Right 12/28/2016   Procedure: ROBOTIC ADRENALECTOMY;  Surgeon: Hollice Espy, MD;  Location: [facility];  Service: Urology;  Laterality: Right;   SKIN CANCER EXCISION     THROAT SURGERY  1998   throat cancer    TUBAL LIGATION     VENTRAL HERNIA REPAIR N/A 03/03/2017   Procedure: HERNIA REPAIR VENTRAL ADULT;  Surgeon: Clayburn Pert, MD;  Location: [facility];  Service: General;  Laterality: N/A;    There were no vitals filed for this visit.    Subjective Assessment - 01/18/21 1534     Subjective Denise Watts is a 65 y.o. female with c/o hip pain secondary to LBP following a fall approximately 2-3 weeks ago. She reports that she has radiating pain down her RLE and increase pain as a result. She reports her pain 6-7/10 now and 10/10 at worst. She reports having increased difficulty walk, standing, lifting, and sitting limiting her ability to perform household chores. She reports having an MRI, seeing her urologist, and being referred to neurology. Pt denies any unexplained weight fluctuation, saddle  paresthesia, or unrelenting night pain at this time. She does report urge urinary incontinence but states it was present prior to injury. Pt has a PMH of breast cancer, skin cancer, and thoracic pain/rib fx, and COPD.    Pertinent History Denise Watts is a 65 y.o. female with c/o hip pain secondary to LBP following a fall approximately 2-3 weeks ago. She reports that she has radiating pain down her RLE and increase pain as a result. She reports her pain 6-7/10 now and 10/10 at worst. She reports having increased difficulty walk, standing, lifting, and sitting limiting her ability to perform household chores. She reports having an MRI, seeing her urologist, and being referred to neurology. Pt denies any unexplained weight fluctuation, saddle paresthesia, or unrelenting night pain at this time. She does report urge urinary incontinence but states it was present prior to injury. Pt has a PMH of breast cancer, skin cancer, and thoracic pain/rib fx, and COPD.    Limitations Sitting;Walking;Standing;Lifting;House hold activities    How long can you sit comfortably? 30 min    How long can you stand comfortably? 45 min    How long can you walk comfortably? 20 min    Diagnostic tests MRI    Patient Stated Goals abolish back hip/pain    Currently in Pain? Yes    Pain Score 6     Pain Location Hip    Pain Orientation Right    Pain Descriptors / Indicators Sharp    Pain Type Chronic pain    Pain Radiating Towards RLE    Pain Onset 1 to 4 weeks ago    Aggravating Factors  walking, standing, lifting, twisting    Pain Relieving Factors hydrocodone, predisone    Multiple Pain Sites Yes    Pain Score 7    Pain Location Back    Pain Orientation Lower    Pain Descriptors / Indicators Sharp    Pain Type Acute pain    Pain Onset 1 to 4 weeks ago    Pain Frequency Intermittent            Lumbar AROM  WFL in all directions except flexion; painful in all directions   HIP MMT R/L Flex 4-/4 Ext 4-/4- ADD  4-/4 ABD4-/4 IR4-/4 ER4-/4  Hip ROM WFL  Knee MMT WFL  Ankle MMT WFL  PAIVM:  P-A joint mobilizations grade 3-4 increased pain concordant pain  L3-L5 hypomobile    Special Tests:  FABER/FADIR (+) Scour R (+)/ L (-) Sacral Thrust Test (-) Straight Leg Raise (+) RLE concordant pain/ LLE (-) Slump Test (+) RLE concordant pain/ LLE (-)  Observation:  Gait: antalgic, decreased step length, decreased stance time R LE.  Posture:forward head,increased lumbar lordosis,bilateral rounded shoulders, increased thoracic kyphosis  Palpation: TTP to R piriformis  Sensation: sensation appears intact bilaterally to light touch  Test and Measures 10MWT: 11 sec = 0.90 m/s  5xSTS: 25 sec    [facility] PT Assessment - 01/19/21 0001       Assessment   Medical Diagnosis R hip/LBP    Referring Provider (PT) Chrismon, Simona Huh PA    Onset Date/Surgical Date --   increased pain ~4 weeks ago   Hand Dominance Right    Prior Therapy Yes for back pain      Precautions   Precautions None      Home Environment   Living Environment Private residence    Living Arrangements Spouse/significant other      Prior Function   Level of [place] Retired              Objective measurements completed on examination: See above findings.     Ther-Ex PT reviewed the following HEP with patient with patient able to demonstrate a set of the following with min cuing for correction needed. PT educated patient on parameters of therex (how/when to inc/decrease intensity, frequency, rep/set range, stretch hold time, and purpose of therex) with verbalized understanding.  Exercises Supine Single Knee to Chest Stretch - 1 x daily - 7 x weekly - 1 sets - 3 reps - 30 hold Long Sitting Hip External Rotator and Abductor Stretch - 1 x daily - 7 x weekly - 1 sets - 3 reps - 30 hold          PT Education - 01/19/21 0804     Education Details Patient was educated on diagnosis,  anatomy and pathology involved, prognosis, role of PT, and was given an HEP, demonstrating exercise with proper form following verbal and tactile cues, and was given a paper hand out to continue exercise at home. Pt was educated on and agreed to plan of care.    Person(s) Educated Patient    Methods Explanation;Demonstration    Comprehension Verbalized understanding;Returned demonstration              PT Short Term Goals - 01/19/21 0828       PT SHORT TERM GOAL #1   Title Pt will be independent with HEP in order to self-manage condition    Baseline 01/18/21: HEP given    Time 4    Period Weeks    Status New    Target Date 02/16/21               PT Long Term Goals - 01/19/21 0829       PT LONG TERM GOAL #1   Title Patient will increase FOTO score to 57 to demonstrate predicted increase in functional mobility to complete ADLs    Baseline 01/18/21: 36    Time 8    Period Weeks    Status New    Target Date 03/15/21      PT LONG TERM GOAL #2   Title Pt will increase gait speed in 10MWT to 1.2 m/s to demonstrate appropriate speed for community ambulation.    Baseline 01/18/21: 0.9 m/s    Time 8    Period Weeks    Status New    Target Date 03/15/21      PT LONG TERM GOAL #3   Title Pt will perform 5XSTS within 12 seconds in order to demonstrate improved LE strength and decrease the likelihood of falling.    Baseline 01/18/21: 25 sec    Time 8    Period Weeks    Status New  Target Date 03/15/21      PT LONG TERM GOAL #4   Title Pt will decrease worst pain as reported on NPRS by at least 3 points in order to demonstrate clinically significant reduction in pain.    Baseline 01/18/21: 10/10    Time 8    Period Weeks    Status New    Target Date 03/15/21                    Plan - 01/18/21 1557     Clinical Impression Statement Patient is a 65 year old female with c/o R hip and LBP with radiating pain RLE. Patients presents to therapy with decreased LE  and core strength, decreased ROM, decreased activity tolerance, and pain. Activity limitations include prolonged lifting, pushing/pulling, walking and standing prohibiting participation in completing heavy household chores and ADLs. Patient will benefit from skilled PT to address these impairments to improve quality of life and increase participation with community and household activities.    Personal Factors and Comorbidities Age;Fitness;Education;Time since onset of injury/illness/exacerbation    Examination-Activity Limitations Lift;Transfers;Sleep;Carry;Locomotion Level;Stand;Bed Mobility    Examination-Participation Restrictions Laundry;Yard Work;Shop;Community Activity    Stability/Clinical Decision Making Evolving/Moderate complexity    Clinical Decision Making Moderate    Rehab Potential Good    PT Frequency 2x / week    PT Duration 8 weeks    PT Treatment/Interventions ADLs/Self Care Home Management;Electrical Stimulation;DME Instruction;Gait training;Functional mobility training;Therapeutic activities;Therapeutic exercise;Manual techniques;Dry needling;Patient/family education;Stair training;Traction;Cryotherapy;Balance training;Passive range of motion;Neuromuscular re-education    PT Next Visit Plan manual therapy, nerve flossing    PT Home Exercise Plan DKTC, ER stretch    Consulted and Agree with Plan of Care Patient             Patient will benefit from skilled therapeutic intervention in order to improve the following deficits and impairments:  Cardiopulmonary status limiting activity, Decreased activity tolerance, Decreased mobility, Hypomobility, Decreased range of motion, Increased muscle spasms, Postural dysfunction, Improper body mechanics, Decreased balance, Decreased endurance, Abnormal gait, Difficulty walking, Decreased strength, Pain, Increased fascial restricitons, Impaired flexibility  Visit Diagnosis: Pain in right hip  Muscle weakness  (generalized)     Problem List Patient Active Problem List   Diagnosis Date Noted   Centrilobular emphysema ([place]) 12/08/2020   Situational stress 10/15/2020   Anxiety and depression 10/15/2020   Chronic cough 08/31/2020   Encounter for screening colonoscopy    Polyp of transverse colon    Dysphagia    Stricture and stenosis of esophagus    Cervical spondylosis 03/18/2019   [place]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   Need for prophylactic vaccination and inoculation against influenza 01/25/2019   Postoperative breast asymmetry 01/04/2019   Status post partial mastectomy 01/04/2019   Vitamin D deficiency 12/12/2018   Bursitis of left shoulder 11/21/2018   Rotator cuff tendinitis, left 11/21/2018   Genetic testing 02/01/2018   Skin cancer 01/18/2018   Goals of care, counseling/discussion 01/12/2018   B12 deficiency 12/28/2017   Malignant neoplasm of lower-outer quadrant of right breast of female, estrogen receptor negative ([place]) 11/24/2017   Chronic abdominal pain (Primary Area of Pain)  04/03/2017   Chronic low back pain (Secondary Area of Pain) (B) (R>L) 04/03/2017   Chronic knee pain (Tertiary Area of Pain) (R) 04/03/2017   Chronic pain syndrome 04/03/2017   Disorder of bone, unspecified 04/03/2017   Other specified health status 04/03/2017   Other long term (current) drug therapy 04/03/2017   Long term current use of opiate  analgesic 04/03/2017   Chronic sacroiliac joint pain 04/03/2017   S/P repair of ventral hernia 03/03/2017   Incisional hernia, without obstruction or gangrene 02/13/2017   Right adrenal mass ([place]) 12/28/2016   RIGHT Adrenal Mass 12/16/2016   Fatigue 02/17/2016   Other fatigue 02/17/2016   Multiple lung nodules on CT 02/17/2016   History of right breast cancer 02/17/2016   Peripheral neuropathy 02/17/2016   Closed fracture of a rib 02/02/2015   History of fundoplication 41/66/0630   History of throat cancer 01/15/2015   Acid reflux 01/15/2015    Gonalgia 01/15/2015   Closed fracture of pubis ([place]) 01/15/2015   Pelvic fracture ([place]) 01/12/2015   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 Fairly IV, PT, DPT Physical Therapist- [facility]  01/19/2021, 8:54 AM  [facility] [address], [place], [address] Phone: [phone]   Fax:  [phone]  Name: Denise Watts MRN: [id number] Date of Birth: 06/26/1955

## 2021-01-19 NOTE — Telephone Encounter (Signed)
Requested medication (s) are due for refill today:   Provider to review  Requested medication (s) are on the active medication list:   Yes  Future visit scheduled:   No   Last ordered: 12/21/2020 #30, 0 refill  Non delegated refill   Requested Prescriptions  Pending Prescriptions Disp Refills   cyclobenzaprine (FLEXERIL) 5 MG tablet [Pharmacy Med Name: CYCLOBENZAPRINE HCL 5 MG TAB] 30 tablet 0    Sig: TAKE 1 TABLET BY MOUTH 3 TIMES DAILY AS NEEDED FOR MUSCLE SPASMS     Not Delegated - Analgesics:  Muscle Relaxants Failed - 01/19/2021  1:15 PM      Failed - This refill cannot be delegated      Passed - Valid encounter within last 6 months    Recent Outpatient Visits           1 week ago Lumbar radiculopathy   [facility] Jerrol Banana., MD   3 months ago Anxiety and depression   [place], PA-C   5 months ago Upper respiratory tract infection, unspecified type   [facility], [place], [place]   7 months ago [place], [place], [place]   7 months ago Cough   [place], Clearnce Sorrel, [place]       Future Appointments             In 2 weeks Chesley Mires, MD [facility]   In 2 months Debroah Loop, PA-C [facility]   In 5 months Agbor-Etang, Aaron Edelman, MD [facility], [place]

## 2021-01-21 ENCOUNTER — Encounter: Payer: Self-pay | Admitting: Physical Therapy

## 2021-01-21 ENCOUNTER — Ambulatory Visit: Payer: Medicare HMO

## 2021-01-21 DIAGNOSIS — M6281 Muscle weakness (generalized): Secondary | ICD-10-CM | POA: Diagnosis not present

## 2021-01-21 DIAGNOSIS — M25551 Pain in right hip: Secondary | ICD-10-CM

## 2021-01-21 NOTE — Therapy (Signed)
[facility] [address] [place], [address] Phone: [phone]   Fax:  [phone]  Physical Therapy Treatment  Patient Details  Name: Denise Watts MRN: [id number] Date of Birth: Sep 28, 1955 Referring Provider (PT): Vernie Murders [place]   Encounter Date: 01/21/2021   PT End of Session - 01/21/21 1354     Visit Number 2    Number of Visits 17    Date for PT Re-Evaluation 03/15/21    Authorization Type [employer] HMO (VL TBD; $20 copay)    Authorization Time Period 09/22/20-11/17/20    Authorization - Visit Number 2    Authorization - Number of Visits 10    PT Start Time 2683    PT Stop Time 4196    PT Time Calculation (min) 40 min    Activity Tolerance Patient tolerated treatment well    Behavior During Therapy [facility] for tasks assessed/performed             Past Medical History:  Diagnosis Date   Anxiety    Arthritis    KNEE RIGHT   Breast cancer ([place]) 1997   right breast lumpectomy with radiation   Breast cancer ([place]) 11/15/2017   INVASIVE MAMMARY CARCINOMA/ triple negative   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   Cough 03/02/2017   INTERMTTENT DRY COUGH   Depression    GERD (gastroesophageal reflux disease)    History of hiatal hernia    Personal history of chemotherapy    Personal history of radiation therapy    Skin cancer    Throat cancer ([place]) 1998   radiation    Past Surgical History:  Procedure Laterality Date   BREAST BIOPSY Right 1997   positive   BREAST BIOPSY Right 11/15/2017   INVASIVE MAMMARY CARCINOMA triple negative   BREAST BIOPSY Right 07/20/2020   [place] bx, Q marker, path pending   BREAST LUMPECTOMY Right 1997   2019 also   BREAST LUMPECTOMY WITH SENTINEL LYMPH NODE BIOPSY Right 12/08/2017   Procedure: BREAST LUMPECTOMY WITH SENTINEL LYMPH NODE BX;  Surgeon: Robert Bellow, MD;  Location: [facility];  Service: General;  Laterality: Right;   BREAST REDUCTION WITH  MASTOPEXY Left 06/10/2019   Procedure: Left breast mastopexy reduction for symmetry;  Surgeon: Wallace Going, DO;  Location: [place];  Service: Plastics;  Laterality: Left;  total 2.5 hours   COLONOSCOPY  2015   COLONOSCOPY WITH PROPOFOL N/A 10/29/2019   Procedure: COLONOSCOPY WITH PROPOFOL;  Surgeon: Lucilla Lame, MD;  Location: [facility];  Service: Endoscopy;  Laterality: N/A;   ESOPHAGOGASTRODUODENOSCOPY (EGD) WITH PROPOFOL N/A 05/24/2019   Procedure: ESOPHAGOGASTRODUODENOSCOPY (EGD) WITH PROPOFOL;  Surgeon: Lucilla Lame, MD;  Location: [facility] ENDOSCOPY;  Service: Endoscopy;  Laterality: N/A;   ESOPHAGUS SURGERY     KNEE SURGERY Right    LIPOSUCTION WITH LIPOFILLING Right 06/10/2019   Procedure: right breast scar and capsule release with fat grafting;  Surgeon: Wallace Going, DO;  Location: [place];  Service: Plastics;  Laterality: Right;   PORT-A-CATH REMOVAL     PORTACATH PLACEMENT Right 01/17/2018   Procedure: INSERTION PORT-A-CATH- RIGHT;  Surgeon: Robert Bellow, MD;  Location: [facility];  Service: General;  Laterality: Right;   ROBOTIC ADRENALECTOMY Right 12/28/2016   Procedure: ROBOTIC ADRENALECTOMY;  Surgeon: Hollice Espy, MD;  Location: [facility];  Service: Urology;  Laterality: Right;   [place]  throat cancer    TUBAL LIGATION     VENTRAL HERNIA REPAIR N/A 03/03/2017   Procedure: HERNIA REPAIR VENTRAL ADULT;  Surgeon: Clayburn Pert, MD;  Location: [facility];  Service: General;  Laterality: N/A;    There were no vitals filed for this visit.   Subjective Assessment - 01/21/21 1351     Subjective Pt reports her pain has decreased to 2-3/10 in her hip and 4/10 in her low back. She continues to report shooting pain down her R LE when she is on her feet a lot.    Pertinent History Denise Watts is a 65 y.o. female with c/o hip pain secondary to LBP following a fall approximately 2-3 weeks ago.  She reports that she has radiating pain down her RLE and increase pain as a result. She reports her pain 6-7/10 now and 10/10 at worst. She reports having increased difficulty walk, standing, lifting, and sitting limiting her ability to perform household chores. She reports having an MRI, seeing her urologist, and being referred to neurology. Pt denies any unexplained weight fluctuation, saddle paresthesia, or unrelenting night pain at this time. She does report urge urinary incontinence but states it was present prior to injury. Pt has a PMH of breast cancer, skin cancer, and thoracic pain/rib fx, and COPD.    Limitations Sitting;Walking;Standing;Lifting;House hold activities    How long can you sit comfortably? 30 min    How long can you stand comfortably? 45 min    How long can you walk comfortably? 20 min    Diagnostic tests MRI    Patient Stated Goals abolish back hip/pain            Therapeutic Exercise  Nu Step L2 x 5 min seat 6 UE 9   R LE Nerve flossing x 20 reps with cues to lifting toe/head; lower toe/head simultaneously: with demonstration and multimodal cues for success  Clamshell each side x 10 reps RTB; bilat  ER + Glute bridge 2 x 8 reps   Review HEP :  DKTC 3 x 30 sec  SKTC x 30 sec  Piriformis 2 x 30 sec; bilat    Manual Therapy (pt in supine)  R  P-A Hip mobs grade 2-3 x 30 sec for pain modulation  R  A-P mobs with distractions 3 x 30 sec for pain modulation  R- LAD at femoral condyles 3 x 30 sec for pain modulation  Manual holding on nerve glides for efficacy x10 reps   *reports improvement in patients pain and noticeable improvement in gait     PT Education - 01/21/21 1522     Education Details therex form/technique    Person(s) Educated Patient    Methods Explanation;Demonstration    Comprehension Verbalized understanding;Returned demonstration              PT Short Term Goals - 01/19/21 0828       PT SHORT TERM GOAL #1   Title Pt  will be independent with HEP in order to self-manage condition    Baseline 01/18/21: HEP given    Time 4    Period Weeks    Status New    Target Date 02/16/21               PT Long Term Goals - 01/19/21 0829       PT LONG TERM GOAL #1   Title Patient will increase FOTO score to 57 to demonstrate predicted increase in functional mobility to complete ADLs  Baseline 01/18/21: 36    Time 8    Period Weeks    Status New    Target Date 03/15/21      PT LONG TERM GOAL #2   Title Pt will increase gait speed in 10MWT to 1.2 m/s to demonstrate appropriate speed for community ambulation.    Baseline 01/18/21: 0.9 m/s    Time 8    Period Weeks    Status New    Target Date 03/15/21      PT LONG TERM GOAL #3   Title Pt will perform 5XSTS within 12 seconds in order to demonstrate improved LE strength and decrease the likelihood of falling.    Baseline 01/18/21: 25 sec    Time 8    Period Weeks    Status New    Target Date 03/15/21      PT LONG TERM GOAL #4   Title Pt will decrease worst pain as reported on NPRS by at least 3 points in order to demonstrate clinically significant reduction in pain.    Baseline 01/18/21: 10/10    Time 8    Period Weeks    Status New    Target Date 03/15/21                   Plan - 01/21/21 1524     Clinical Impression Statement Pt tolerated session well with reports of decreased hip and LBP at end of session. PT initiated LE strengthening, mobility, and neurodynamic progression this session. Patient will continue to benefit from skilled therapy for exercise performance to ensure proper technique for safe and effective progression. Continue PT POC as able.    Personal Factors and Comorbidities Age;Fitness;Education;Time since onset of injury/illness/exacerbation    Examination-Activity Limitations Lift;Transfers;Sleep;Carry;Locomotion Level;Stand;Bed Mobility    Examination-Participation Restrictions Laundry;Yard Work;Shop;Community  Activity    Stability/Clinical Decision Making Evolving/Moderate complexity    Clinical Decision Making Moderate    Rehab Potential Good    PT Frequency 2x / week    PT Duration 8 weeks    PT Treatment/Interventions ADLs/Self Care Home Management;Electrical Stimulation;DME Instruction;Gait training;Functional mobility training;Therapeutic activities;Therapeutic exercise;Manual techniques;Dry needling;Patient/family education;Stair training;Traction;Cryotherapy;Balance training;Passive range of motion;Neuromuscular re-education    PT Next Visit Plan manual therapy, nerve flossing    PT Home Exercise Plan DKTC, ER stretch    Consulted and Agree with Plan of Care Patient             Patient will benefit from skilled therapeutic intervention in order to improve the following deficits and impairments:  Cardiopulmonary status limiting activity, Decreased activity tolerance, Decreased mobility, Hypomobility, Decreased range of motion, Increased muscle spasms, Postural dysfunction, Improper body mechanics, Decreased balance, Decreased endurance, Abnormal gait, Difficulty walking, Decreased strength, Pain, Increased fascial restricitons, Impaired flexibility  Visit Diagnosis: Pain in right hip     Problem List Patient Active Problem List   Diagnosis Date Noted   Centrilobular emphysema ([place]) 12/08/2020   Situational stress 10/15/2020   Anxiety and depression 10/15/2020   Chronic cough 08/31/2020   Encounter for screening colonoscopy    Polyp of transverse colon    Dysphagia    Stricture and stenosis of esophagus    Cervical spondylosis 03/18/2019   [place]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   Need for prophylactic vaccination and inoculation against influenza 01/25/2019   Postoperative breast asymmetry 01/04/2019   Status post partial mastectomy 01/04/2019   Vitamin D deficiency 12/12/2018   Bursitis of left shoulder 11/21/2018   Rotator cuff tendinitis, left 11/21/2018  Genetic testing  02/01/2018   Skin cancer 01/18/2018   Goals of care, counseling/discussion 01/12/2018   B12 deficiency 12/28/2017   Malignant neoplasm of lower-outer quadrant of right breast of female, estrogen receptor negative ([place]) 11/24/2017   Chronic abdominal pain (Primary Area of Pain)  04/03/2017   Chronic low back pain (Secondary Area of Pain) (B) (R>L) 04/03/2017   Chronic knee pain (Tertiary Area of Pain) (R) 04/03/2017   Chronic pain syndrome 04/03/2017   Disorder of bone, unspecified 04/03/2017   Other specified health status 04/03/2017   Other long term (current) drug therapy 04/03/2017   Long term current use of opiate analgesic 04/03/2017   Chronic sacroiliac joint pain 04/03/2017   S/P repair of ventral hernia 03/03/2017   Incisional hernia, without obstruction or gangrene 02/13/2017   Right adrenal mass ([place]) 12/28/2016   RIGHT Adrenal Mass 12/16/2016   Fatigue 02/17/2016   Other fatigue 02/17/2016   Multiple lung nodules on CT 02/17/2016   History of right breast cancer 02/17/2016   Peripheral neuropathy 02/17/2016   Closed fracture of a rib 02/02/2015   History of fundoplication 04/24/4974   History of throat cancer 01/15/2015   Acid reflux 01/15/2015   Gonalgia 01/15/2015   Closed fracture of pubis ([place]) 01/15/2015   Pelvic fracture ([place]) 01/12/2015   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 Fairly IV, PT, DPT Physical Therapist- [facility]  01/21/2021, 3:35 PM  [place] [facility] [address] [place], [address] Phone: [phone]   Fax:  [phone]  Name: Denise Watts MRN: [id number] Date of Birth: 1956-01-03

## 2021-01-22 ENCOUNTER — Encounter: Payer: Self-pay | Admitting: Family Medicine

## 2021-01-22 LAB — POCT URINALYSIS DIPSTICK
Blood, UA: NEGATIVE
Glucose, UA: NEGATIVE
Ketones, UA: NEGATIVE
Leukocytes, UA: NEGATIVE
Nitrite, UA: NEGATIVE
Protein, UA: NEGATIVE
Spec Grav, UA: 1.02 (ref 1.010–1.025)
Urobilinogen, UA: 0.2 E.U./dL
pH, UA: 6 (ref 5.0–8.0)

## 2021-01-25 ENCOUNTER — Other Ambulatory Visit: Payer: Self-pay | Admitting: Family Medicine

## 2021-01-25 DIAGNOSIS — Z1231 Encounter for screening mammogram for malignant neoplasm of breast: Secondary | ICD-10-CM

## 2021-01-26 ENCOUNTER — Ambulatory Visit: Payer: Medicare HMO | Admitting: Physical Therapy

## 2021-01-28 ENCOUNTER — Ambulatory Visit: Payer: Medicare HMO | Admitting: Physical Therapy

## 2021-01-29 ENCOUNTER — Emergency Department
Admission: EM | Admit: 2021-01-29 | Discharge: 2021-01-29 | Disposition: A | Discharge status: Home / Self Care | Payer: Medicare HMO | Source: Home / Self Care | Attending: Emergency Medicine | Admitting: Emergency Medicine

## 2021-01-29 ENCOUNTER — Encounter: Payer: Self-pay | Admitting: Physician Assistant

## 2021-01-29 ENCOUNTER — Ambulatory Visit: Payer: Self-pay | Admitting: *Deleted

## 2021-01-29 ENCOUNTER — Other Ambulatory Visit: Payer: Self-pay

## 2021-01-29 ENCOUNTER — Emergency Department: Payer: Medicare HMO

## 2021-01-29 DIAGNOSIS — U071 COVID-19: Secondary | ICD-10-CM | POA: Diagnosis present

## 2021-01-29 DIAGNOSIS — K449 Diaphragmatic hernia without obstruction or gangrene: Secondary | ICD-10-CM | POA: Diagnosis present

## 2021-01-29 DIAGNOSIS — F32A Depression, unspecified: Secondary | ICD-10-CM | POA: Diagnosis present

## 2021-01-29 DIAGNOSIS — Z87891 Personal history of nicotine dependence: Secondary | ICD-10-CM | POA: Insufficient documentation

## 2021-01-29 DIAGNOSIS — Z8521 Personal history of malignant neoplasm of larynx: Secondary | ICD-10-CM | POA: Insufficient documentation

## 2021-01-29 DIAGNOSIS — Z833 Family history of diabetes mellitus: Secondary | ICD-10-CM | POA: Diagnosis not present

## 2021-01-29 DIAGNOSIS — J9811 Atelectasis: Secondary | ICD-10-CM | POA: Diagnosis not present

## 2021-01-29 DIAGNOSIS — Z79899 Other long term (current) drug therapy: Secondary | ICD-10-CM | POA: Diagnosis not present

## 2021-01-29 DIAGNOSIS — R059 Cough, unspecified: Secondary | ICD-10-CM | POA: Diagnosis not present

## 2021-01-29 DIAGNOSIS — Z801 Family history of malignant neoplasm of trachea, bronchus and lung: Secondary | ICD-10-CM | POA: Diagnosis not present

## 2021-01-29 DIAGNOSIS — K219 Gastro-esophageal reflux disease without esophagitis: Secondary | ICD-10-CM | POA: Diagnosis not present

## 2021-01-29 DIAGNOSIS — R911 Solitary pulmonary nodule: Secondary | ICD-10-CM | POA: Diagnosis not present

## 2021-01-29 DIAGNOSIS — Z85828 Personal history of other malignant neoplasm of skin: Secondary | ICD-10-CM | POA: Insufficient documentation

## 2021-01-29 DIAGNOSIS — Z8249 Family history of ischemic heart disease and other diseases of the circulatory system: Secondary | ICD-10-CM | POA: Diagnosis not present

## 2021-01-29 DIAGNOSIS — Z85819 Personal history of malignant neoplasm of unspecified site of lip, oral cavity, and pharynx: Secondary | ICD-10-CM | POA: Diagnosis not present

## 2021-01-29 DIAGNOSIS — Z808 Family history of malignant neoplasm of other organs or systems: Secondary | ICD-10-CM | POA: Diagnosis not present

## 2021-01-29 DIAGNOSIS — R0602 Shortness of breath: Secondary | ICD-10-CM | POA: Diagnosis not present

## 2021-01-29 DIAGNOSIS — J9601 Acute respiratory failure with hypoxia: Secondary | ICD-10-CM | POA: Diagnosis present

## 2021-01-29 DIAGNOSIS — K222 Esophageal obstruction: Secondary | ICD-10-CM | POA: Diagnosis present

## 2021-01-29 DIAGNOSIS — F419 Anxiety disorder, unspecified: Secondary | ICD-10-CM | POA: Diagnosis present

## 2021-01-29 DIAGNOSIS — J441 Chronic obstructive pulmonary disease with (acute) exacerbation: Secondary | ICD-10-CM | POA: Diagnosis not present

## 2021-01-29 DIAGNOSIS — B370 Candidal stomatitis: Secondary | ICD-10-CM | POA: Diagnosis present

## 2021-01-29 DIAGNOSIS — Z923 Personal history of irradiation: Secondary | ICD-10-CM | POA: Diagnosis not present

## 2021-01-29 DIAGNOSIS — Z7982 Long term (current) use of aspirin: Secondary | ICD-10-CM | POA: Diagnosis not present

## 2021-01-29 DIAGNOSIS — Z882 Allergy status to sulfonamides status: Secondary | ICD-10-CM | POA: Diagnosis not present

## 2021-01-29 DIAGNOSIS — R1319 Other dysphagia: Secondary | ICD-10-CM | POA: Diagnosis not present

## 2021-01-29 DIAGNOSIS — N390 Urinary tract infection, site not specified: Secondary | ICD-10-CM | POA: Diagnosis present

## 2021-01-29 DIAGNOSIS — Z9221 Personal history of antineoplastic chemotherapy: Secondary | ICD-10-CM | POA: Diagnosis not present

## 2021-01-29 DIAGNOSIS — Z853 Personal history of malignant neoplasm of breast: Secondary | ICD-10-CM | POA: Diagnosis not present

## 2021-01-29 DIAGNOSIS — J432 Centrilobular emphysema: Secondary | ICD-10-CM | POA: Diagnosis present

## 2021-01-29 DIAGNOSIS — R1312 Dysphagia, oropharyngeal phase: Secondary | ICD-10-CM | POA: Diagnosis not present

## 2021-01-29 DIAGNOSIS — Z7952 Long term (current) use of systemic steroids: Secondary | ICD-10-CM | POA: Diagnosis not present

## 2021-01-29 MED ORDER — ONDANSETRON 4 MG PO TBDP: 4.0000 mg | ORAL_TABLET | Freq: Three times a day (TID) | ORAL | 0 refills | Status: DC | PRN

## 2021-01-29 MED ORDER — BENZONATATE 100 MG PO CAPS: ORAL_CAPSULE | ORAL | 0 refills | Status: DC

## 2021-01-29 MED ORDER — PREDNISONE 20 MG PO TABS: 40.0000 mg | ORAL_TABLET | Freq: Every day | ORAL | 0 refills | Status: DC

## 2021-01-29 MED ORDER — PSEUDOEPH-BROMPHEN-DM 30-2-10 MG/5ML PO SYRP: 5.0000 mL | ORAL_SOLUTION | Freq: Four times a day (QID) | ORAL | 0 refills | Status: DC | PRN

## 2021-01-29 MED ORDER — ALBUTEROL SULFATE HFA 108 (90 BASE) MCG/ACT IN AERS: 2.0000 | INHALATION_SPRAY | Freq: Four times a day (QID) | RESPIRATORY_TRACT | 0 refills | Status: DC | PRN

## 2021-01-29 NOTE — Telephone Encounter (Signed)
Summary: Covid test advice   Pt wants advice on where to get Covid tested  Best contact:  [phone]     + COVID home test Reason for Disposition  MILD difficulty breathing (e.g., minimal/no SOB at rest, SOB with walking, pulse <100)  Answer Assessment - Initial Assessment Questions 1. COVID-19 DIAGNOSIS: "Who made your COVID-19 diagnosis?" "Was it confirmed by a positive lab test or self-test?" If not diagnosed by a doctor (or NP/PA), ask "Are there lots of cases (community spread) where you live?" Note: See public health department website, if unsure.     + COVID home test 2. COVID-19 EXPOSURE: "Was there any known exposure to COVID before the symptoms began?" CDC Definition of close contact: within 6 feet (2 meters) for a total of 15 minutes or more over a 24-hour period.      unknown 3. ONSET: "When did the COVID-19 symptoms start?"      Wednesday-9/21 4. WORST SYMPTOM: "What is your worst symptom?" (e.g., cough, fever, shortness of breath, muscle aches)     Freezing, cough 5. COUGH: "Do you have a cough?" If Yes, ask: "How bad is the cough?"       Cough - deep cough deep in chest- green sputum 6. FEVER: "Do you have a fever?" If Yes, ask: "What is your temperature, how was it measured, and when did it start?"     no 7. RESPIRATORY STATUS: "Describe your breathing?" (e.g., shortness of breath, wheezing, unable to speak)      SOB, chest congestion 8. BETTER-SAME-WORSE: "Are you getting better, staying the same or getting worse compared to yesterday?"  If getting worse, ask, "In what way?"     Worse- weak,shaky 9. HIGH RISK DISEASE: "Do you have any chronic medical problems?" (e.g., asthma, heart or lung disease, weak immune system, obesity, etc.)     Age,COPD 10. VACCINE: "Have you had the COVID-19 vaccine?" If Yes, ask: "Which one, how many shots, when did you get it?"       Yes- pfizer 11. BOOSTER: "Have you received your COVID-19 booster?" If Yes, ask: "Which one and when did  you get it?"       Yes 12. PREGNANCY: "Is there any chance you are pregnant?" "When was your last menstrual period?"       N/a 13. OTHER SYMPTOMS: "Do you have any other symptoms?"  (e.g., chills, fatigue, headache, loss of smell or taste, muscle pain, sore throat)       Fatigue, no taste,sore throat 14. O2 SATURATION MONITOR:  "Do you use an oxygen saturation monitor (pulse oximeter) at home?" If Yes, ask "What is your reading (oxygen level) today?" "What is your usual oxygen saturation reading?" (e.g., 95%)       no  Protocols used: Coronavirus (COVID-19) Diagnosed or Suspected-A-AH

## 2021-01-29 NOTE — Discharge Instructions (Addendum)
Take the prescription meds as prescribed. Follow-up with [facility] as needed.

## 2021-01-29 NOTE — ED Provider Notes (Signed)
Emergency Medicine Provider Triage Evaluation Note  Denise Watts , a 65 y.o. female  was evaluated in triage.  Pt complains of mucus congestion, cough, weakness, body aches, loss of taste, nausea.  Patient is taken several at home COVID test that are positive.  Patient wants confirmation of testing and any medications we can prescribe to help with symptoms.  No chest pain, difficulty breathing, emesis.  Review of Systems  Positive: Congestion, cough, weakness, body aches, nausea Negative: Chest pain, difficulty breathing, emesis  Physical Exam  BP 93/66   Pulse (!) 116   Temp 98 F (36.7 C) (Oral)   Resp 18   Ht 5\' 1"  (1.549 m)   Wt 62.6 kg   SpO2 95%   BMI 26.07 kg/m  Gen:   Awake, no distress   Resp:  Normal effort.  Very mild respiratory wheezes in the lower lung fields.  lung sounds.  No other adventitious lung sounds MSK:   Moves extremities without difficulty  Other:  Reassuring heart sounds  Medical Decision Making  Medically screening exam initiated at 4:13 PM.  Appropriate orders placed.  [name] was informed that the remainder of the evaluation will be completed by another provider, this initial triage assessment does not replace that evaluation, and the importance of remaining in the ED until their evaluation is complete.  Patient presents to the ED with multiple symptoms consistent with COVID.  Positive test at home.  Patient would like confirmation testing and medications for symptom relief.  COVID test and x-ray at this time.   Darletta Moll, PA-C 01/29/21 1618    Vanessa Bonfield, MD 01/29/21 1745

## 2021-01-29 NOTE — ED Triage Notes (Signed)
Pt here with possible covid. Pt states that she took several at home test that were positive. Cough, weakness, SOB, body aches, and loss of taste.

## 2021-01-29 NOTE — ED Notes (Signed)
Patient stable and discharged with all personal belongings and AVS. AVS and discharge instructions reviewed with patient and opportunity for questions provided.   

## 2021-01-29 NOTE — Telephone Encounter (Signed)
Patient was calling to see where to go for COVID testing- when speaking with patient she states she has 3 + home test- patient advised no test needed - she is + COVID. Patient reports her symptoms started mid week last week- cough, chest congestion, weakness/fatigue, some SOB. Advised patient she needs evaluation and UC strongly advised for evaluation and treatment of symptoms.

## 2021-01-29 NOTE — ED Provider Notes (Signed)
[facility] Department Provider Note ____________________________________________  Time seen: 32  I have reviewed the triage vital signs and the nursing notes.  HISTORY  Chief Complaint  Covid Positive   HPI [facility] is a 65 y.o. female presents as up to the ED for evaluation of symptoms related to [facility].  Patient reports multiple positive test at home, following symptom onset last week.  She notes ongoing cough, congestion, and some mild shortness of breath.  She denies any fevers, chills, nausea vomiting, diarrhea.  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   Cough 03/02/2017   INTERMTTENT DRY COUGH   Depression    GERD (gastroesophageal reflux disease)    History of hiatal hernia    Personal history of chemotherapy    Personal history of radiation therapy    Skin cancer    Throat cancer ([facility]) 1998   radiation    Patient Active Problem List   Diagnosis Date Noted   Centrilobular emphysema ([facility]) 12/08/2020   Situational stress 10/15/2020   Anxiety and depression 10/15/2020   Chronic cough 08/31/2020   Encounter for screening colonoscopy    Polyp of transverse colon    Dysphagia    Stricture and stenosis of esophagus    Cervical spondylosis 03/18/2019   [facility] 03/18/2019   Need for prophylactic vaccination and inoculation against influenza 01/25/2019   Postoperative breast asymmetry 01/04/2019   Status post partial mastectomy 01/04/2019   Vitamin D deficiency 12/12/2018   Bursitis of left shoulder 11/21/2018   Rotator cuff tendinitis, left 11/21/2018   Genetic testing 02/01/2018   Skin cancer 01/18/2018   Goals of care, counseling/discussion 01/12/2018   B12 deficiency 12/28/2017    Malignant neoplasm of lower-outer quadrant of right breast of female, estrogen receptor negative ([facility]) 11/24/2017   Chronic abdominal pain (Primary Area of Pain)  04/03/2017   Chronic low back pain (Secondary Area of Pain) (B) (R>L) 04/03/2017   Chronic knee pain (Tertiary Area of Pain) (R) 04/03/2017   Chronic pain Watts 04/03/2017   Disorder of bone, unspecified 04/03/2017   Other specified health status 04/03/2017   Other long term (current) drug therapy 04/03/2017   Long term current use of opiate analgesic 04/03/2017   Chronic sacroiliac joint pain 04/03/2017   S/P repair of ventral hernia 03/03/2017   Incisional hernia, without obstruction or gangrene 02/13/2017   Right adrenal mass ([facility]) 12/28/2016   RIGHT Adrenal Mass 12/16/2016   Fatigue 02/17/2016   Other fatigue 02/17/2016   Multiple lung nodules on CT 02/17/2016   History of right breast cancer 02/17/2016   Peripheral neuropathy 02/17/2016   Closed fracture of a rib 02/02/2015   History of fundoplication 46/27/0350   History of throat cancer 01/15/2015   Acid reflux 01/15/2015   Gonalgia 01/15/2015   Closed fracture of pubis ([facility]) 01/15/2015   Pelvic fracture ([facility]) 01/12/2015   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[facility]) 11/10/2014   Herpes simplex virus (HSV) epithelial keratitis 03/06/2013   Nuclear sclerotic cataract 03/06/2013   Onychia of finger 10/23/2008   Odontogenic tumor 03/13/2008   Tobacco use 06/14/2007    Past Surgical History:  Procedure Laterality Date   BREAST BIOPSY Right 1997   positive   BREAST  BIOPSY Right 11/15/2017   INVASIVE MAMMARY CARCINOMA triple negative   BREAST BIOPSY Right 07/20/2020   [place] bx, Q marker, path pending   BREAST LUMPECTOMY Right 1997   2019 also   BREAST LUMPECTOMY WITH SENTINEL LYMPH NODE BIOPSY Right 12/08/2017   Procedure: BREAST LUMPECTOMY WITH SENTINEL LYMPH NODE BX;  Surgeon: Robert Bellow, MD;  Location: [facility];  Service: General;  Laterality: Right;   BREAST REDUCTION  WITH MASTOPEXY Left 06/10/2019   Procedure: Left breast mastopexy reduction for symmetry;  Surgeon: Wallace Going, DO;  Location: [place];  Service: Plastics;  Laterality: Left;  total 2.5 hours   COLONOSCOPY  2015   COLONOSCOPY WITH PROPOFOL N/A 10/29/2019   Procedure: COLONOSCOPY WITH PROPOFOL;  Surgeon: Lucilla Lame, MD;  Location: [facility];  Service: Endoscopy;  Laterality: N/A;   ESOPHAGOGASTRODUODENOSCOPY (EGD) WITH PROPOFOL N/A 05/24/2019   Procedure: ESOPHAGOGASTRODUODENOSCOPY (EGD) WITH PROPOFOL;  Surgeon: Lucilla Lame, MD;  Location: [facility];  Service: Endoscopy;  Laterality: N/A;   ESOPHAGUS SURGERY     KNEE SURGERY Right    LIPOSUCTION WITH LIPOFILLING Right 06/10/2019   Procedure: right breast scar and capsule release with fat grafting;  Surgeon: Wallace Going, DO;  Location: [facility];  Service: Plastics;  Laterality: Right;   PORT-A-CATH REMOVAL     PORTACATH PLACEMENT Right 01/17/2018   Procedure: INSERTION PORT-A-CATH- RIGHT;  Surgeon: Robert Bellow, MD;  Location: [facility];  Service: General;  Laterality: Right;   ROBOTIC ADRENALECTOMY Right 12/28/2016   Procedure: ROBOTIC ADRENALECTOMY;  Surgeon: Hollice Espy, MD;  Location: [facility];  Service: Urology;  Laterality: Right;   SKIN CANCER EXCISION     THROAT SURGERY  1998   throat cancer    TUBAL LIGATION     VENTRAL HERNIA REPAIR N/A 03/03/2017   Procedure: HERNIA REPAIR VENTRAL ADULT;  Surgeon: Clayburn Pert, MD;  Location: [facility];  Service: General;  Laterality: N/A;    Prior to Admission medications   Medication Sig Start Date End Date Taking? Authorizing Provider  albuterol (VENTOLIN HFA) 108 (90 Base) MCG/ACT inhaler Inhale 2 puffs into the lungs every 6 (six) hours as needed for shortness of breath. 01/29/21  Yes Evynn Boutelle, Dannielle Karvonen, PA-C  benzonatate (TESSALON PERLES) 100 MG capsule Take 1-2 tabs TID prn cough 01/29/21  Yes Taylen Osorto, Dannielle Karvonen,  PA-C  brompheniramine-pseudoephedrine-DM 30-2-10 MG/5ML syrup Take 5 mLs by mouth 4 (four) times daily as needed. 01/29/21  Yes [name], Dannielle Karvonen, PA-C  ondansetron (ZOFRAN ODT) 4 MG disintegrating tablet Take 1 tablet (4 mg total) by mouth every 8 (eight) hours as needed. 01/29/21  Yes Oden Lindaman, Dannielle Karvonen, PA-C  predniSONE (DELTASONE) 20 MG tablet Take 2 tablets (40 mg total) by mouth daily with breakfast for 5 days. 01/29/21 02/03/21 Yes Cote Mayabb, Dannielle Karvonen, PA-C  aspirin EC 81 MG tablet Take 1 tablet (81 mg total) by mouth daily. Swallow whole. 10/19/20   Kate Sable, MD  atorvastatin (LIPITOR) 40 MG tablet Take 1 tablet (40 mg total) by mouth daily. 12/15/20 03/15/21  Kate Sable, MD  Budeson-Glycopyrrol-Formoterol (BREZTRI AEROSPHERE) 160-9-4.8 MCG/ACT AERO Inhale 2 puffs into the lungs in the morning and at bedtime. 08/06/20   Chesley Mires, MD  busPIRone (BUSPAR) 5 MG tablet Take 1 tablet (5 mg total) by mouth 2 (two) times daily. 03/03/20   Chrismon, Vickki Muff, PA-C  cyclobenzaprine (FLEXERIL) 5 MG tablet TAKE 1 TABLET BY MOUTH 3 TIMES DAILY AS  NEEDED FOR MUSCLE SPASMS 01/19/21   Birdie Sons, MD  DULoxetine (CYMBALTA) 60 MG capsule Take by mouth. 08/27/20 08/27/21  [provider]  fluticasone (FLONASE) 50 MCG/ACT nasal spray Place 1 spray into both nostrils at bedtime. 06/19/20   Mar Daring, PA-C  gabapentin (NEURONTIN) 300 MG capsule TAKE 1 CAPSULE BY MOUTH 3 TIMES A DAY 11/17/20   Chrismon, Vickki Muff, PA-C  HYDROcodone-acetaminophen (NORCO/VICODIN) 5-325 MG tablet Take 1 tablet by mouth every 4 (four) hours as needed for moderate pain. 01/12/21   Jerrol Banana., MD  omeprazole (PRILOSEC) 40 MG capsule Take 40 mg by mouth in the morning and at bedtime.    [provider]  ondansetron (ZOFRAN) 4 MG tablet Take 1 tablet (4 mg total) by mouth every 8 (eight) hours as needed for nausea or vomiting. 06/06/19   Scheeler, Carola Rhine, PA-C  oxybutynin  (DITROPAN XL) 15 MG 24 hr tablet Take 1 tablet (15 mg total) by mouth daily. 12/09/20   Hollice Espy, MD  rOPINIRole (REQUIP) 0.25 MG tablet TAKE 1 TO 2 TABLETS BY MOUTH AT BEDTIME FOR RESTLESS LEGS 01/18/21   Chrismon, Vickki Muff, PA-C    Allergies Sulfa antibiotics  Family History  Problem Relation Age of Onset   Diabetes Mother    Heart attack Mother    Cancer Sister        cancer of eyes, spread to lung, other places. died at 44   Cancer Sister 84       unknown primary, spread to bone, brain died in 24's   Cancer Paternal Aunt        unk type   Cancer Paternal Grandmother        type unk   Prostate cancer Neg Hx    Kidney cancer Neg Hx    Bladder Cancer Neg Hx     Social History Social History   Tobacco Use   Smoking status: Former    Packs/day: 1.50    Years: 30.00    Pack years: 45.00    Types: Cigarettes    Quit date: 05/03/1995    Years since quitting: 25.7   Smokeless tobacco: Never  Vaping Use   Vaping Use: Never used  Substance Use Topics   Alcohol use: Yes    Alcohol/week: 4.0 standard drinks    Types: 4 Shots of liquor per week    Comment: 2 per night on weekends   Drug use: No    Review of Systems  Constitutional: Negative for fever. Eyes: Negative for visual changes. ENT: Negative for sore throat. Cardiovascular: Negative for chest pain. Respiratory: Positive for shortness of breath. Gastrointestinal: Negative for abdominal pain, vomiting and diarrhea. Genitourinary: Negative for dysuria. Musculoskeletal: Negative for back pain. Skin: Negative for rash. Neurological: Negative for headaches, focal weakness or numbness.  Reports loss of taste sensation. ____________________________________________  PHYSICAL EXAM:  VITAL SIGNS: ED Triage Vitals  Enc Vitals Group     BP 01/29/21 1605 93/66     Pulse Rate 01/29/21 1604 (!) 116     Resp 01/29/21 1604 18     Temp 01/29/21 1604 98 F (36.7 C)     Temp Source 01/29/21 1604 Oral     SpO2  01/29/21 1604 95 %     Weight 01/29/21 1604 138 lb (62.6 kg)     Height 01/29/21 1604 5\' 1"  (1.549 m)     Head Circumference --      Peak Flow --  Pain Score 01/29/21 1604 5     Pain Loc --      Pain Edu? --      Excl. in [place]? --     Constitutional: Alert and oriented. Well appearing and in no distress. Head: Normocephalic and atraumatic. Eyes: Conjunctivae are normal. Normal extraocular movements Cardiovascular: Normal rate, regular rhythm. Normal distal pulses. Respiratory: Normal respiratory effort. No wheezes/rales/rhonchi. Musculoskeletal: Nontender with normal range of motion in all extremities.  Neurologic:  Normal gait without ataxia. Normal speech and language. No gross focal neurologic deficits are appreciated. Skin:  Skin is warm, dry and intact. No rash noted. Psychiatric: Mood and affect are normal. Patient exhibits appropriate insight and judgment. ____________________________________________    {LABS (pertinent positives/negatives) Labs Reviewed  SARS CORONAVIRUS 2 (TAT 6-24 HRS)  ____________________________________________  {EKG  ____________________________________________   RADIOLOGY Official radiology report(s): DG Chest 2 View  Result Date: 01/29/2021 CLINICAL DATA:  Cough, body aches, COVID-19 positive EXAM: CHEST - 2 VIEW COMPARISON:  06/08/2020 CT 10/31/2018 FINDINGS: Frontal and lateral views of the chest demonstrate an unremarkable cardiac silhouette. Stable 1.5 cm left lower lobe pulmonary nodule. The remaining small nodule seen on recent chest CT are not readily apparent by x-ray. No acute airspace disease, effusion, or pneumothorax. Minimal scarring at the left lung base. No acute bony abnormality. IMPRESSION: 1. No acute intrathoracic process. 2. Stable 1.5 cm left lower lobe pulmonary nodule, unchanged since 2020. The remaining pulmonary nodule seen on the recent chest CT 08/21/2020 are not readily apparent by x-ray. Electronically Signed   By:  Randa Ngo M.D.   On: 01/29/2021 16:47   ____________________________________________  PROCEDURES  Procedures ____________________________________________   INITIAL IMPRESSION / ASSESSMENT AND PLAN / ED COURSE  As part of my medical decision making, I reviewed the following data within the [place] reviewed as noted and Notes from prior ED visits   Patient ED evaluation of ongoing symptoms related to COVID, following multiple positive home test.  She presents for evaluation of her symptoms.  She is stable at this time without signs of acute respiratory distress.  Her chest x-ray does not reveal any acute findings.  She has a stable nodule recently confirmed on CT.  Patient with discharged with prescriptions for prednisone, Tessalon Perles, albuterol, Bromfed syrup, and Zofran.  She will follow-up with primary provider for ongoing symptoms or return to the ED if needed.  SIERAH LACEWELL was evaluated in Emergency Department on 01/29/2021 for the symptoms described in the history of present illness. She was evaluated in the context of the global COVID-19 pandemic, which necessitated consideration that the patient might be at risk for infection with the SARS-CoV-2 virus that causes COVID-19. Institutional protocols and algorithms that pertain to the evaluation of patients at risk for COVID-19 are in a state of rapid change based on information released by regulatory bodies including the CDC and federal and state organizations. These policies and algorithms were followed during the patient's care in the ED.  ____________________________________________  FINAL CLINICAL IMPRESSION(S) / ED DIAGNOSES  Final diagnoses:  [id number]      Melvenia Needles, PA-C 01/29/21 1831    Carrie Mew, MD 01/30/21 [id number]

## 2021-01-30 LAB — SARS CORONAVIRUS 2 (TAT 6-24 HRS): SARS Coronavirus 2: POSITIVE — AB

## 2021-01-31 ENCOUNTER — Other Ambulatory Visit: Payer: Self-pay

## 2021-01-31 ENCOUNTER — Inpatient Hospital Stay
Admission: EM | Admit: 2021-01-31 | Discharge: 2021-02-11 | DRG: 177 | Disposition: A | Discharge status: Home Health | Payer: Medicare HMO | Attending: Obstetrics and Gynecology | Admitting: Obstetrics and Gynecology

## 2021-01-31 ENCOUNTER — Emergency Department: Payer: Medicare HMO

## 2021-01-31 ENCOUNTER — Encounter: Payer: Self-pay | Admitting: Emergency Medicine

## 2021-01-31 DIAGNOSIS — N390 Urinary tract infection, site not specified: Secondary | ICD-10-CM | POA: Diagnosis present

## 2021-01-31 DIAGNOSIS — Z85819 Personal history of malignant neoplasm of unspecified site of lip, oral cavity, and pharynx: Secondary | ICD-10-CM

## 2021-01-31 DIAGNOSIS — Z8249 Family history of ischemic heart disease and other diseases of the circulatory system: Secondary | ICD-10-CM

## 2021-01-31 DIAGNOSIS — Z7982 Long term (current) use of aspirin: Secondary | ICD-10-CM

## 2021-01-31 DIAGNOSIS — U071 COVID-19: Principal | ICD-10-CM | POA: Diagnosis present

## 2021-01-31 DIAGNOSIS — Z808 Family history of malignant neoplasm of other organs or systems: Secondary | ICD-10-CM

## 2021-01-31 DIAGNOSIS — J9601 Acute respiratory failure with hypoxia: Secondary | ICD-10-CM | POA: Diagnosis present

## 2021-01-31 DIAGNOSIS — Z882 Allergy status to sulfonamides status: Secondary | ICD-10-CM

## 2021-01-31 DIAGNOSIS — R0602 Shortness of breath: Secondary | ICD-10-CM | POA: Diagnosis present

## 2021-01-31 DIAGNOSIS — J432 Centrilobular emphysema: Secondary | ICD-10-CM | POA: Diagnosis present

## 2021-01-31 DIAGNOSIS — Z833 Family history of diabetes mellitus: Secondary | ICD-10-CM

## 2021-01-31 DIAGNOSIS — K219 Gastro-esophageal reflux disease without esophagitis: Secondary | ICD-10-CM | POA: Diagnosis present

## 2021-01-31 DIAGNOSIS — Z7952 Long term (current) use of systemic steroids: Secondary | ICD-10-CM

## 2021-01-31 DIAGNOSIS — Z79899 Other long term (current) drug therapy: Secondary | ICD-10-CM

## 2021-01-31 DIAGNOSIS — Z85828 Personal history of other malignant neoplasm of skin: Secondary | ICD-10-CM

## 2021-01-31 DIAGNOSIS — J9811 Atelectasis: Secondary | ICD-10-CM | POA: Diagnosis not present

## 2021-01-31 DIAGNOSIS — K449 Diaphragmatic hernia without obstruction or gangrene: Secondary | ICD-10-CM | POA: Diagnosis present

## 2021-01-31 DIAGNOSIS — K222 Esophageal obstruction: Secondary | ICD-10-CM | POA: Diagnosis present

## 2021-01-31 DIAGNOSIS — F32A Depression, unspecified: Secondary | ICD-10-CM | POA: Diagnosis present

## 2021-01-31 DIAGNOSIS — Z801 Family history of malignant neoplasm of trachea, bronchus and lung: Secondary | ICD-10-CM

## 2021-01-31 DIAGNOSIS — Z8521 Personal history of malignant neoplasm of larynx: Secondary | ICD-10-CM

## 2021-01-31 DIAGNOSIS — B9620 Unspecified Escherichia coli [E. coli] as the cause of diseases classified elsewhere: Secondary | ICD-10-CM | POA: Diagnosis present

## 2021-01-31 DIAGNOSIS — F419 Anxiety disorder, unspecified: Secondary | ICD-10-CM | POA: Diagnosis present

## 2021-01-31 DIAGNOSIS — Z853 Personal history of malignant neoplasm of breast: Secondary | ICD-10-CM

## 2021-01-31 DIAGNOSIS — Z9221 Personal history of antineoplastic chemotherapy: Secondary | ICD-10-CM

## 2021-01-31 DIAGNOSIS — Z923 Personal history of irradiation: Secondary | ICD-10-CM

## 2021-01-31 DIAGNOSIS — B370 Candidal stomatitis: Secondary | ICD-10-CM | POA: Diagnosis present

## 2021-01-31 DIAGNOSIS — Z87891 Personal history of nicotine dependence: Secondary | ICD-10-CM

## 2021-01-31 DIAGNOSIS — E559 Vitamin D deficiency, unspecified: Secondary | ICD-10-CM | POA: Diagnosis present

## 2021-01-31 DIAGNOSIS — E785 Hyperlipidemia, unspecified: Secondary | ICD-10-CM | POA: Diagnosis present

## 2021-01-31 LAB — CBC WITH DIFFERENTIAL/PLATELET
Abs Immature Granulocytes: 0.06 10*3/uL (ref 0.00–0.07)
Basophils Absolute: 0 10*3/uL (ref 0.0–0.1)
Basophils Relative: 0 %
Eosinophils Absolute: 0 10*3/uL (ref 0.0–0.5)
Eosinophils Relative: 0 %
HCT: 40.8 % (ref 36.0–46.0)
Hemoglobin: 13.7 g/dL (ref 12.0–15.0)
Immature Granulocytes: 1 %
Lymphocytes Relative: 6 %
Lymphs Abs: 0.7 10*3/uL (ref 0.7–4.0)
MCH: 32.2 pg (ref 26.0–34.0)
MCHC: 33.6 g/dL (ref 30.0–36.0)
MCV: 96 fL (ref 80.0–100.0)
Monocytes Absolute: 0.5 10*3/uL (ref 0.1–1.0)
Monocytes Relative: 4 %
Neutro Abs: 10.6 10*3/uL — ABNORMAL HIGH (ref 1.7–7.7)
Neutrophils Relative %: 89 %
Platelets: 210 10*3/uL (ref 150–400)
RBC: 4.25 MIL/uL (ref 3.87–5.11)
RDW: 12.6 % (ref 11.5–15.5)
WBC: 11.8 10*3/uL — ABNORMAL HIGH (ref 4.0–10.5)
nRBC: 0 % (ref 0.0–0.2)

## 2021-01-31 LAB — BASIC METABOLIC PANEL
Anion gap: 9 (ref 5–15)
BUN: 13 mg/dL (ref 8–23)
CO2: 25 mmol/L (ref 22–32)
Calcium: 9 mg/dL (ref 8.9–10.3)
Chloride: 104 mmol/L (ref 98–111)
Creatinine, Ser: 0.74 mg/dL (ref 0.44–1.00)
GFR, Estimated: >60 mL/min (ref >60)
Glucose, Bld: 161 mg/dL — ABNORMAL HIGH (ref 70–99)
Potassium: 3.9 mmol/L (ref 3.5–5.1)
Sodium: 138 mmol/L (ref 135–145)

## 2021-01-31 MED ORDER — SODIUM CHLORIDE 0.9 % IV BOLUS
500.0000 mL | Freq: Once | INTRAVENOUS | Status: AC
  Administered 2021-01-31: 500 mL via INTRAVENOUS

## 2021-01-31 MED ORDER — IOHEXOL 350 MG/ML SOLN
75.0000 mL | Freq: Once | INTRAVENOUS | Status: AC | PRN
  Administered 2021-01-31: 75 mL via INTRAVENOUS

## 2021-01-31 NOTE — ED Notes (Signed)
Pt RA sat noted to be 88-89% on RA upon VS recheck, pt placed on 2L via [place] by this RN at this time.

## 2021-01-31 NOTE — ED Provider Notes (Signed)
[facility] Emergency Department Provider Note  ____________________________________________   Event Date/Time   First MD Initiated Contact with Patient 01/31/21 2317     (approximate)  I have reviewed the triage vital signs and the nursing notes.   HISTORY  Chief Complaint Covid Positive    HPI Denise Watts is a 65 y.o. female with previous breast cancer in remission, hyperlipidemia who comes in with concerns for COVID.  Patient was seen on 9/30 at that time had a positive COVID test.  At that time patient was discharged with prescriptions for prednisone, Tessalon Perles, albuterol, Bromfed syrup, Zofran.  Patient states that she picked these up the day after.  I tried asked patient how long her symptoms been going on she states she is not really sure.  She thinks its been at least since last Wednesday almost 10 days ago.  She states that she came in today because she was told that she needed antibiotics for concern for a bacterial infection.  Patient was not able to show the message in triage and I do not see any messages in epic.  Patient did not even have any labs done when she was here a few days ago so I am not sure what this would have been based upon.  She report reports just feeling short of breath and having continued coughing.  She states that the COVID has just really take it out of her the symptoms have been constant, worse with ambulation, better at rest.  She does report some symptoms of UTI as well and states that she is been taking some Pyridium to help with her symptoms.     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   Cough 03/02/2017   INTERMTTENT DRY COUGH   Depression    GERD (gastroesophageal reflux disease)     History of hiatal hernia    Personal history of chemotherapy    Personal history of radiation therapy    Skin cancer    Throat cancer ([place]) 1998   radiation    Patient Active Problem List   Diagnosis Date Noted   Centrilobular emphysema ([place]) 12/08/2020   Situational stress 10/15/2020   Anxiety and depression 10/15/2020   Chronic cough 08/31/2020   Encounter for screening colonoscopy    Polyp of transverse colon    Dysphagia    Stricture and stenosis of esophagus    Cervical spondylosis 03/18/2019   [place]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   Need for prophylactic vaccination and inoculation against influenza 01/25/2019   Postoperative breast asymmetry 01/04/2019   Status post partial mastectomy 01/04/2019   Vitamin D deficiency 12/12/2018   Bursitis of left shoulder 11/21/2018   Rotator cuff tendinitis, left 11/21/2018   Genetic testing 02/01/2018   Skin cancer 01/18/2018   Goals of care, counseling/discussion 01/12/2018   B12 deficiency 12/28/2017   Malignant neoplasm of lower-outer quadrant of right breast of female, estrogen receptor negative ([place]) 11/24/2017   Chronic abdominal pain (Primary Area of Pain)  04/03/2017   Chronic low back pain (Secondary Area of Pain) (B) (R>L) 04/03/2017   Chronic knee pain [facility] Area of Pain) (R) 04/03/2017   Chronic pain syndrome 04/03/2017   Disorder of bone, unspecified 04/03/2017  Other specified health status 04/03/2017   Other long term (current) drug therapy 04/03/2017   Long term current use of opiate analgesic 04/03/2017   Chronic sacroiliac joint pain 04/03/2017   S/P repair of ventral hernia 03/03/2017   Incisional hernia, without obstruction or gangrene 02/13/2017   Right adrenal mass ([place]) 12/28/2016   RIGHT Adrenal Mass 12/16/2016   Fatigue 02/17/2016   Other fatigue 02/17/2016   Multiple lung nodules on CT 02/17/2016   History of right breast cancer 02/17/2016   Peripheral neuropathy 02/17/2016   Closed fracture of  a rib 02/02/2015   History of fundoplication 56/38/7564   History of throat cancer 01/15/2015   Acid reflux 01/15/2015   Gonalgia 01/15/2015   Closed fracture of pubis ([place]) 01/15/2015   Pelvic fracture ([place]) 01/12/2015   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[place]) 11/10/2014   Herpes simplex virus (HSV) epithelial keratitis 03/06/2013   Nuclear sclerotic cataract 03/06/2013   Onychia of finger 10/23/2008   Odontogenic tumor 03/13/2008   Tobacco use 06/14/2007    Past Surgical History:  Procedure Laterality Date   BREAST BIOPSY Right 1997   positive   BREAST BIOPSY Right 11/15/2017   INVASIVE MAMMARY CARCINOMA triple negative   BREAST BIOPSY Right 07/20/2020   [place] bx, Q marker, path pending   BREAST LUMPECTOMY Right 1997   2019 also   BREAST LUMPECTOMY WITH SENTINEL LYMPH NODE BIOPSY Right 12/08/2017   Procedure: BREAST LUMPECTOMY WITH SENTINEL LYMPH NODE BX;  Surgeon: Robert Bellow, MD;  Location: [facility] ORS;  Service: General;  Laterality: Right;   BREAST REDUCTION WITH MASTOPEXY Left 06/10/2019   Procedure: Left breast mastopexy reduction for symmetry;  Surgeon: Wallace Going, DO;  Location: [place];  Service: Plastics;  Laterality: Left;  total 2.5 hours   COLONOSCOPY  2015   COLONOSCOPY WITH PROPOFOL N/A 10/29/2019   Procedure: COLONOSCOPY WITH PROPOFOL;  Surgeon: Lucilla Lame, MD;  Location[facility] END[facility]COPY;  Service: Endoscopy;  Laterality: N/A;   ESOPHAGOGASTRODUODENOSCOPY (EGD) WITH PROPOFOL N/A 05/24/2019   Procedure: ESOPHAGOGASTRODUODENOSCOPY (EGD) WITH PROPOFOL;  Surgeon: Lucilla Lame, MD;  Location: [facility] ENDOSCOPY;  Service: Endoscopy;  Laterality: N/A;   ESOPHAGUS SURGERY     KNEE SURGERY Right    LIPOSUCTION WITH LIPOFILLING Right 06/10/2019   Procedure: right breast scar and capsule release with fat grafting;  Surgeon: Wallace Going, DO;  Location: [place];  Service: Plastics;  Laterality: Right;   PORT-A-CATH REMOVAL     PORTACATH  PLACEMENT Right 01/17/2018   Procedure: INSERTION PORT-A-CATH- RIGHT;  Surgeon: Robert Bellow, MD;  Location: [facility] ORS;  Service: General;  Laterality: Right;   ROBOTIC ADRENALECTOMY Right 12/28/2016   Procedure: ROBOTIC ADRENALECTOMY;  Surgeon: Hollice Espy, MD;  Location: [facility] ORS;  Service: Urology;  Laterality: Right;   SKIN CANCER EXCISION     THROAT SURGERY  1998   throat cancer    TUBAL LIGATION     VENTRAL HERNIA REPAIR N/A 03/03/2017   Procedure: HERNIA REPAIR VENTRAL ADULT;  Surgeon: Clayburn Pert, MD;  Location: [facility] ORS;  Service: General;  Laterality: N/A;    Prior to Admission medications   Medication Sig Start Date End Date Taking? Authorizing Provider  albuterol (VENTOLIN HFA) 108 (90 Base) MCG/ACT inhaler Inhale 2 puffs into the lungs every 6 (six) hours as needed for shortness of breath. 01/29/21   Menshew, Dannielle Karvonen, PA-C  aspirin EC 81 MG tablet Take 1 tablet (81 mg total) by mouth daily. Swallow  whole. 10/19/20   Kate Sable, MD  atorvastatin (LIPITOR) 40 MG tablet Take 1 tablet (40 mg total) by mouth daily. 12/15/20 03/15/21  Kate Sable, MD  benzonatate (TESSALON PERLES) 100 MG capsule Take 1-2 tabs TID prn cough 01/29/21   Menshew, Dannielle Karvonen, PA-C  brompheniramine-pseudoephedrine-DM 30-2-10 MG/5ML syrup Take 5 mLs by mouth 4 (four) times daily as needed. 01/29/21   Menshew, Dannielle Karvonen, PA-C  Budeson-Glycopyrrol-Formoterol (BREZTRI AEROSPHERE) 160-9-4.8 MCG/ACT AERO Inhale 2 puffs into the lungs in the morning and at bedtime. 08/06/20   Chesley Mires, MD  busPIRone (BUSPAR) 5 MG tablet Take 1 tablet (5 mg total) by mouth 2 (two) times daily. 03/03/20   Chrismon, Vickki Muff, PA-C  cyclobenzaprine (FLEXERIL) 5 MG tablet TAKE 1 TABLET BY MOUTH 3 TIMES DAILY AS NEEDED FOR MUSCLE SPASMS 01/19/21   Birdie Sons, MD  DULoxetine (CYMBALTA) 60 MG capsule Take by mouth. 08/27/20 08/27/21  [provider]  fluticasone (FLONASE) 50 MCG/ACT nasal  spray Place 1 spray into both nostrils at bedtime. 06/19/20   Mar Daring, PA-C  gabapentin (NEURONTIN) 300 MG capsule TAKE 1 CAPSULE BY MOUTH 3 TIMES A DAY 11/17/20   Chrismon, Vickki Muff, PA-C  HYDROcodone-acetaminophen (NORCO/VICODIN) 5-325 MG tablet Take 1 tablet by mouth every 4 (four) hours as needed for moderate pain. 01/12/21   Jerrol Banana., MD  omeprazole (PRILOSEC) 40 MG capsule Take 40 mg by mouth in the morning and at bedtime.    [provider]  ondansetron (ZOFRAN ODT) 4 MG disintegrating tablet Take 1 tablet (4 mg total) by mouth every 8 (eight) hours as needed. 01/29/21   Menshew, Dannielle Karvonen, PA-C  ondansetron (ZOFRAN) 4 MG tablet Take 1 tablet (4 mg total) by mouth every 8 (eight) hours as needed for nausea or vomiting. 06/06/19   Scheeler, Carola Rhine, PA-C  oxybutynin (DITROPAN XL) 15 MG 24 hr tablet Take 1 tablet (15 mg total) by mouth daily. 12/09/20   Hollice Espy, MD  predniSONE (DELTASONE) 20 MG tablet Take 2 tablets (40 mg total) by mouth daily with breakfast for 5 days. 01/29/21 02/03/21  Menshew, Dannielle Karvonen, PA-C  rOPINIRole (REQUIP) 0.25 MG tablet TAKE 1 TO 2 TABLETS BY MOUTH AT BEDTIME FOR RESTLESS LEGS 01/18/21   Chrismon, Vickki Muff, PA-C    Allergies Sulfa antibiotics  Family History  Problem Relation Age of Onset   Diabetes Mother    Heart attack Mother    Cancer Sister        cancer of eyes, spread to lung, other places. died at 58   Cancer Sister 33       unknown primary, spread to bone, brain died in 67's   Cancer Paternal Aunt        unk type   Cancer Paternal Grandmother        type unk   Prostate cancer Neg Hx    Kidney cancer Neg Hx    Bladder Cancer Neg Hx     Social History Social History   Tobacco Use   Smoking status: Former    Packs/day: 1.50    Years: 30.00    Pack years: 45.00    Types: Cigarettes    Quit date: 05/03/1995    Years since quitting: 25.7   Smokeless tobacco: Never  Vaping Use   Vaping Use:  Never used  Substance Use Topics   Alcohol use: Yes    Alcohol/week: 4.0 standard drinks    Types:  4 Shots of liquor per week    Comment: 2 per night on weekends   Drug use: No      Review of Systems Constitutional: No fever/chills Eyes: No visual changes. ENT: No sore throat. Cardiovascular: Denies chest pain. Respiratory: Positive shortness of breath, cough Gastrointestinal: No abdominal pain.  No nausea, no vomiting.  No diarrhea.  No constipation. Genitourinary: Positive dysuria Musculoskeletal: Negative for back pain. Skin: Negative for rash. Neurological: Negative for headaches, focal weakness or numbness. All other ROS negative ____________________________________________   PHYSICAL EXAM:  VITAL SIGNS: ED Triage Vitals [01/31/21 1903]  Enc Vitals Group     BP 140/81     Pulse Rate (!) 108     Resp 20     Temp 98.5 F (36.9 C)     Temp Source Oral     SpO2 98 %     Weight 136 lb 11 oz (62 kg)     Height 5\' 1"  (1.549 m)     Head Circumference      Peak Flow      Pain Score 0     Pain Loc      Pain Edu?      Excl. in [place]?     Constitutional: Alert and oriented. Well appearing and in no acute distress. Eyes: Conjunctivae are normal. EOMI. Head: Atraumatic. Nose: No congestion/rhinnorhea. Mouth/Throat: Mucous membranes are moist.   Neck: No stridor. Trachea Midline. FROM Cardiovascular: Normal rate, regular rhythm. Grossly normal heart sounds.  Good peripheral circulation. Respiratory: Normal respiratory effort.  No retractions. Lungs CTAB. Gastrointestinal: Soft and nontender. No distention. No abdominal bruits.  Musculoskeletal: No lower extremity tenderness nor edema.  No joint effusions. Neurologic:  Normal speech and language. No gross focal neurologic deficits are appreciated.  Skin:  Skin is warm, dry and intact. No rash noted. Psychiatric: Mood and affect are normal. Speech and behavior are normal. GU: Deferred    ____________________________________________   LABS (all labs ordered are listed, but only abnormal results are displayed)  Labs Reviewed  BASIC METABOLIC PANEL - Abnormal; Notable for the following components:      Result Value   Glucose, Bld 161 (*)    All other components within normal limits  CBC WITH DIFFERENTIAL/PLATELET - Abnormal; Notable for the following components:   WBC 11.8 (*)    Neutro Abs 10.6 (*)    All other components within normal limits   ____________________________________________   ED ECG REPORT I, Vanessa Dupont, the attending physician, personally viewed and interpreted this ECG.  Normal sinus rate of 85, no ST elevation, no T wave inversions, normal intervals ____________________________________________  RADIOLOGY   Official radiology report(s): CT Angio Chest PE W and/or Wo Contrast  Result Date: 01/31/2021 CLINICAL DATA:  Ongoing symptoms of COVID. EXAM: CT ANGIOGRAPHY CHEST WITH CONTRAST TECHNIQUE: Multidetector CT imaging of the chest was performed using the standard protocol during bolus administration of intravenous contrast. Multiplanar CT image reconstructions and MIPs were obtained to evaluate the vascular anatomy. CONTRAST:  87mL OMNIPAQUE IOHEXOL 350 MG/ML SOLN COMPARISON:  August 21, 2020 FINDINGS: Cardiovascular: There is moderate severity calcification of the aortic arch, without evidence of aortic aneurysm or dissection. Satisfactory opacification of the pulmonary arteries to the segmental level. No evidence of pulmonary embolism. Normal heart size. No pericardial effusion. Mediastinum/Nodes: No enlarged mediastinal, hilar, or axillary lymph nodes. Thyroid gland, trachea, and esophagus demonstrate no significant findings. Lungs/Pleura: Mild atelectasis is seen within the posterior aspects of the bilateral lung left  greater than right. There is no evidence of acute infiltrate, pleural effusion or pneumothorax. Upper Abdomen: There is a small  hiatal hernia. A 1.3 cm diameter simple cyst is seen along the anterior aspect of the upper pole of the left kidney. Musculoskeletal: A 4.9 cm x 2.3 cm lipoma is seen along the anterolateral aspect of the lower right chest wall. No acute osseous abnormalities are identified. Review of the MIP images confirms the above findings. IMPRESSION: 1. No CT evidence of pulmonary embolism or acute cardiopulmonary disease. 2. Small hiatal hernia. 3. 4.9 cm x 2.3 cm lower right chest wall lipoma. 4. Aortic atherosclerosis. Aortic Atherosclerosis (ICD10-I70.0). Electronically Signed   By: Virgina Norfolk M.D.   On: 01/31/2021 20:23    ____________________________________________   PROCEDURES  Procedure(s) performed (including Critical Care):  Procedures   ____________________________________________   INITIAL IMPRESSION / ASSESSMENT AND PLAN / ED COURSE  SKARLETH DELMONICO was evaluated in Emergency Department on 01/31/2021 for the symptoms described in the history of present illness. She was evaluated in the context of the global COVID-19 pandemic, which necessitated consideration that the patient might be at risk for infection with the SARS-CoV-2 virus that causes COVID-19. Institutional protocols and algorithms that pertain to the evaluation of patients at risk for COVID-19 are in a state of rapid change based on information released by regulatory bodies including the CDC and federal and state organizations. These policies and algorithms were followed during the patient's care in the ED.    Patient comes in initially slightly tachycardic in triage and had a CT scan ordered to evaluate for PE, pneumonia or other Acute pathology.  EKG was ordered evaluate for ACS I added on a troponin to make sure no evidence of ACS given patient's continued symptoms for greater than 10 days.  Labs ordered evaluate for any electrolyte abnormalities, AKI and I added on a urine to evaluate for UTI.  Was reported that patient was  hypoxic in triage.  However I took patient off the oxygen her sats have been 100%.  I ambulated her and sats went down to 94%.  Initially patient was tachycardic but heart rates have come down to the 80s without any symptoms.  Patient did feel little dizzy with ambulation so we will give 500 cc of fluid.  CT imaging was negative for PE and labs are pretty reassuring.  Her white count is slightly elevated with a little bit of a left shift so we will get urine to evaluate for UTI but there is no evidence of pneumonia on the CT.  Patient is not a candidate for any treatments for COVID given her symptoms of been greater than 10 days  Discussed with the nurse who initially charted desaturations and she was doing a 2-hour vital sign recheck and the patient was concerned of more shortness of breath at that time sats were 88% and she waited for over a minute and it was a good waveform and confirmed to be accurate and therefore patient was placed on 2 L.  Patient is urine is concerning for UTI with positive nitrites, WBCs and patient's symptoms therefore urine culture was sent we will start on antibiotics given elevated white count with neutrophils.  She is afebrile and does not appear septic.  She has had prior urine culture that has been sensitive to ceftriaxone.  12:36 AM reevaluated patient with ambulation she is very dizzy and feels just feeling weak in her legs.  She is pretty dyspneic and lowest oxygen  level that I see is 91% but given the concern that she is intermittently desatted I placed patient back on 2 L.  We will admit to the hospital team for her COVID and UTI with dizziness.  Her cranial nerves are intact and her finger-nose is intact I do not feel like she is having a stroke given she states that her symptoms are only when she gets up and ambulates and at rest she does not have any dizziness.            ____________________________________________   FINAL CLINICAL IMPRESSION(S) / ED  DIAGNOSES   Final diagnoses:  [id number]  Urinary tract infection without hematuria, site unspecified      MEDICATIONS GIVEN DURING THIS VISIT:  Medications  iohexol (OMNIPAQUE) 350 MG/ML injection 75 mL (75 mLs Intravenous Contrast Given 01/31/21 1946)  sodium chloride 0.9 % bolus 500 mL (500 mLs Intravenous New Bag/Given 01/31/21 2355)     ED Discharge Orders     None        Note:  This document was prepared using Dragon voice recognition software and may include unintentional dictation errors.    Vanessa Dyer, MD 02/01/21 [phone]

## 2021-01-31 NOTE — ED Triage Notes (Signed)
Pt via POV from home. Pt was recently seen and d/c dx with COVID on 9/30. Pt states she got a message from someone telling her that she needed antibiotics or she would end up in the hospital. [place], [place] at bedside for [facility].   Pt has no other complaints at this time.  Pt is A&Ox4 and NAD.

## 2021-01-31 NOTE — ED Provider Notes (Signed)
Emergency Medicine Provider Triage Evaluation Note  Denise Watts , a 65 y.o. female  was evaluated in triage.  Pt complains of returns to the ED for evaluation of ongoing symptoms of Covid. She was initially seen here 3 days ago after performing multiple positive home tests. She was evaluated and endorsed symptoms had begun several days before presenting. She was already finishing a steroid course for acute leg pain. She presents today claiming to have received a MyChart message informing her that she was highly contagious and needed antibiotics and would end up in the hospital if she didn't follow-up. She is not able to present the message on her phone account.   Review of Systems  Positive: SOB, cough Negative: NVD  Physical Exam  BP 140/81 (BP Location: Left Arm)   Pulse (!) 108   Temp 98.5 F (36.9 C) (Oral)   Resp 20   Ht 5\' 1"  (1.549 m)   Wt 62 kg   SpO2 98%   BMI 25.83 kg/m  Gen:   Awake, no distress  NAD Resp:  Normal effort CTA MSK:   Moves extremities without difficulty  Other:  CVS: tachy rate  Medical Decision Making  Medically screening exam initiated at 7:09 PM.  Appropriate orders placed.  [name] was informed that the remainder of the evaluation will be completed by another provider, this initial triage assessment does not replace that evaluation, and the importance of remaining in the ED until their evaluation is complete.  Patient with subsequent ED evaluation of ongoing Covid symptoms.    Melvenia Needles, PA-C 01/31/21 1915    Nance Pear, MD 01/31/21 2043

## 2021-02-01 ENCOUNTER — Encounter: Payer: Self-pay | Admitting: Internal Medicine

## 2021-02-01 ENCOUNTER — Ambulatory Visit: Payer: Medicare HMO | Admitting: Physical Therapy

## 2021-02-01 DIAGNOSIS — N390 Urinary tract infection, site not specified: Secondary | ICD-10-CM | POA: Diagnosis present

## 2021-02-01 DIAGNOSIS — Z8249 Family history of ischemic heart disease and other diseases of the circulatory system: Secondary | ICD-10-CM | POA: Diagnosis not present

## 2021-02-01 DIAGNOSIS — Z85828 Personal history of other malignant neoplasm of skin: Secondary | ICD-10-CM | POA: Diagnosis not present

## 2021-02-01 DIAGNOSIS — Z7982 Long term (current) use of aspirin: Secondary | ICD-10-CM | POA: Diagnosis not present

## 2021-02-01 DIAGNOSIS — U071 COVID-19: Secondary | ICD-10-CM | POA: Diagnosis present

## 2021-02-01 DIAGNOSIS — J432 Centrilobular emphysema: Secondary | ICD-10-CM

## 2021-02-01 DIAGNOSIS — J9601 Acute respiratory failure with hypoxia: Secondary | ICD-10-CM

## 2021-02-01 DIAGNOSIS — J441 Chronic obstructive pulmonary disease with (acute) exacerbation: Secondary | ICD-10-CM | POA: Diagnosis not present

## 2021-02-01 DIAGNOSIS — Z923 Personal history of irradiation: Secondary | ICD-10-CM | POA: Diagnosis not present

## 2021-02-01 DIAGNOSIS — R0602 Shortness of breath: Secondary | ICD-10-CM | POA: Diagnosis present

## 2021-02-01 DIAGNOSIS — Z882 Allergy status to sulfonamides status: Secondary | ICD-10-CM | POA: Diagnosis not present

## 2021-02-01 DIAGNOSIS — Z79899 Other long term (current) drug therapy: Secondary | ICD-10-CM | POA: Diagnosis not present

## 2021-02-01 DIAGNOSIS — Z9221 Personal history of antineoplastic chemotherapy: Secondary | ICD-10-CM | POA: Diagnosis not present

## 2021-02-01 DIAGNOSIS — Z87891 Personal history of nicotine dependence: Secondary | ICD-10-CM | POA: Diagnosis not present

## 2021-02-01 DIAGNOSIS — Z85819 Personal history of malignant neoplasm of unspecified site of lip, oral cavity, and pharynx: Secondary | ICD-10-CM | POA: Diagnosis not present

## 2021-02-01 DIAGNOSIS — K219 Gastro-esophageal reflux disease without esophagitis: Secondary | ICD-10-CM | POA: Diagnosis not present

## 2021-02-01 DIAGNOSIS — Z853 Personal history of malignant neoplasm of breast: Secondary | ICD-10-CM | POA: Diagnosis not present

## 2021-02-01 DIAGNOSIS — Z808 Family history of malignant neoplasm of other organs or systems: Secondary | ICD-10-CM | POA: Diagnosis not present

## 2021-02-01 DIAGNOSIS — K449 Diaphragmatic hernia without obstruction or gangrene: Secondary | ICD-10-CM | POA: Diagnosis present

## 2021-02-01 DIAGNOSIS — Z8521 Personal history of malignant neoplasm of larynx: Secondary | ICD-10-CM | POA: Diagnosis not present

## 2021-02-01 DIAGNOSIS — F32A Depression, unspecified: Secondary | ICD-10-CM | POA: Diagnosis present

## 2021-02-01 DIAGNOSIS — K222 Esophageal obstruction: Secondary | ICD-10-CM | POA: Diagnosis present

## 2021-02-01 DIAGNOSIS — R1319 Other dysphagia: Secondary | ICD-10-CM | POA: Diagnosis not present

## 2021-02-01 DIAGNOSIS — B370 Candidal stomatitis: Secondary | ICD-10-CM | POA: Diagnosis present

## 2021-02-01 DIAGNOSIS — Z7952 Long term (current) use of systemic steroids: Secondary | ICD-10-CM | POA: Diagnosis not present

## 2021-02-01 DIAGNOSIS — R1312 Dysphagia, oropharyngeal phase: Secondary | ICD-10-CM | POA: Diagnosis not present

## 2021-02-01 DIAGNOSIS — F419 Anxiety disorder, unspecified: Secondary | ICD-10-CM | POA: Diagnosis present

## 2021-02-01 DIAGNOSIS — Z801 Family history of malignant neoplasm of trachea, bronchus and lung: Secondary | ICD-10-CM | POA: Diagnosis not present

## 2021-02-01 DIAGNOSIS — Z833 Family history of diabetes mellitus: Secondary | ICD-10-CM | POA: Diagnosis not present

## 2021-02-01 LAB — COMPREHENSIVE METABOLIC PANEL
ALT: 18 U/L (ref 0–44)
AST: 22 U/L (ref 15–41)
Albumin: 3.5 g/dL (ref 3.5–5.0)
Alkaline Phosphatase: 78 U/L (ref 38–126)
Anion gap: 5 (ref 5–15)
BUN: 10 mg/dL (ref 8–23)
CO2: 28 mmol/L (ref 22–32)
Calcium: 8.9 mg/dL (ref 8.9–10.3)
Chloride: 108 mmol/L (ref 98–111)
Creatinine, Ser: 0.77 mg/dL (ref 0.44–1.00)
GFR, Estimated: >60 mL/min (ref >60)
Glucose, Bld: 141 mg/dL — ABNORMAL HIGH (ref 70–99)
Potassium: 4.2 mmol/L (ref 3.5–5.1)
Sodium: 141 mmol/L (ref 135–145)
Total Bilirubin: 0.6 mg/dL (ref 0.3–1.2)
Total Protein: 7 g/dL (ref 6.5–8.1)

## 2021-02-01 LAB — URINALYSIS, ROUTINE W REFLEX MICROSCOPIC
Bacteria, UA: NONE SEEN
Bilirubin Urine: NEGATIVE
Glucose, UA: NEGATIVE mg/dL
Hgb urine dipstick: NEGATIVE
Ketones, ur: NEGATIVE mg/dL
Leukocytes,Ua: NEGATIVE
Nitrite: POSITIVE — AB
Protein, ur: NEGATIVE mg/dL
Specific Gravity, Urine: >1.046 — ABNORMAL HIGH (ref 1.005–1.030)
Squamous Epithelial / HPF: NONE SEEN (ref 0–5)
pH: 5 (ref 5.0–8.0)

## 2021-02-01 LAB — BLOOD GAS, VENOUS
Acid-Base Excess: 6.1 mmol/L — ABNORMAL HIGH (ref 0.0–2.0)
Bicarbonate: 31.2 mmol/L — ABNORMAL HIGH (ref 20.0–28.0)
O2 Saturation: 77.2 %
Patient temperature: 37
pCO2, Ven: 46 mmHg (ref 44.0–60.0)
pH, Ven: 7.44 — ABNORMAL HIGH (ref 7.250–7.430)
pO2, Ven: 40 mmHg (ref 32.0–45.0)

## 2021-02-01 LAB — CBC
HCT: 41.4 % (ref 36.0–46.0)
Hemoglobin: 13.9 g/dL (ref 12.0–15.0)
MCH: 32.4 pg (ref 26.0–34.0)
MCHC: 33.6 g/dL (ref 30.0–36.0)
MCV: 96.5 fL (ref 80.0–100.0)
Platelets: 185 10*3/uL (ref 150–400)
RBC: 4.29 MIL/uL (ref 3.87–5.11)
RDW: 12.6 % (ref 11.5–15.5)
WBC: 10.6 10*3/uL — ABNORMAL HIGH (ref 4.0–10.5)
nRBC: 0 % (ref 0.0–0.2)

## 2021-02-01 LAB — PROCALCITONIN: Procalcitonin: <0.1 ng/mL

## 2021-02-01 LAB — LACTIC ACID, PLASMA
Lactic Acid, Venous: 1.1 mmol/L (ref 0.5–1.9)
Lactic Acid, Venous: 1.6 mmol/L (ref 0.5–1.9)

## 2021-02-01 LAB — HEPATIC FUNCTION PANEL
ALT: 17 U/L (ref 0–44)
AST: 24 U/L (ref 15–41)
Albumin: 3.7 g/dL (ref 3.5–5.0)
Alkaline Phosphatase: 81 U/L (ref 38–126)
Bilirubin, Direct: <0.1 mg/dL (ref 0.0–0.2)
Total Bilirubin: 0.7 mg/dL (ref 0.3–1.2)
Total Protein: 7.3 g/dL (ref 6.5–8.1)

## 2021-02-01 LAB — TROPONIN I (HIGH SENSITIVITY): Troponin I (High Sensitivity): 6 ng/L (ref <18)

## 2021-02-01 LAB — LIPASE, BLOOD: Lipase: 24 U/L (ref 11–51)

## 2021-02-01 LAB — HIV ANTIBODY (ROUTINE TESTING W REFLEX): HIV Screen 4th Generation wRfx: NONREACTIVE

## 2021-02-01 MED ORDER — SODIUM CHLORIDE 0.9 % IV SOLN: 1.0000 g | Freq: Once | INTRAVENOUS | Status: DC

## 2021-02-01 MED ORDER — BENZONATATE 100 MG PO CAPS
100.0000 mg | ORAL_CAPSULE | Freq: Three times a day (TID) | ORAL | Status: DC | PRN
Start: 2021-02-01 — End: 2021-02-06
  Administered 2021-02-01: 100 mg via ORAL
  Administered 2021-02-01: 200 mg via ORAL
  Administered 2021-02-02: 100 mg via ORAL
  Administered 2021-02-03 – 2021-02-05 (×5): 200 mg via ORAL
  Filled 2021-02-01: qty 2
  Filled 2021-02-01 (×2): qty 1
  Filled 2021-02-01 (×5): qty 2

## 2021-02-01 MED ORDER — SODIUM CHLORIDE 0.9 % IV SOLN
200.0000 mg | Freq: Once | INTRAVENOUS | Status: AC
  Administered 2021-02-01: 200 mg via INTRAVENOUS
  Filled 2021-02-01: qty 200

## 2021-02-01 MED ORDER — ASPIRIN EC 81 MG PO TBEC
81.0000 mg | DELAYED_RELEASE_TABLET | Freq: Every day | ORAL | Status: DC
  Administered 2021-02-01 – 2021-02-11 (×11): 81 mg via ORAL
  Filled 2021-02-01 (×10): qty 1

## 2021-02-01 MED ORDER — HYDRALAZINE HCL 20 MG/ML IJ SOLN: 10.0000 mg | Freq: Four times a day (QID) | INTRAMUSCULAR | Status: DC | PRN

## 2021-02-01 MED ORDER — UMECLIDINIUM BROMIDE 62.5 MCG/INH IN AEPB
1.0000 | INHALATION_SPRAY | Freq: Every day | RESPIRATORY_TRACT | Status: DC
  Administered 2021-02-03 – 2021-02-04 (×2): 1 via RESPIRATORY_TRACT
  Filled 2021-02-01 (×2): qty 7

## 2021-02-01 MED ORDER — PSEUDOEPH-BROMPHEN-DM 30-2-10 MG/5ML PO SYRP: 5.0000 mL | ORAL_SOLUTION | Freq: Four times a day (QID) | ORAL | Status: DC | PRN

## 2021-02-01 MED ORDER — HEPARIN SODIUM (PORCINE) 5000 UNIT/ML IJ SOLN
5000.0000 [IU] | Freq: Three times a day (TID) | INTRAMUSCULAR | Status: DC
Start: 2021-02-01 — End: 2021-02-03
  Administered 2021-02-01 – 2021-02-03 (×8): 5000 [IU] via SUBCUTANEOUS
  Filled 2021-02-01 (×7): qty 1

## 2021-02-01 MED ORDER — BUSPIRONE HCL 5 MG PO TABS
5.0000 mg | ORAL_TABLET | Freq: Two times a day (BID) | ORAL | Status: DC
  Administered 2021-02-01 – 2021-02-11 (×21): 5 mg via ORAL
  Filled 2021-02-01 (×22): qty 1

## 2021-02-01 MED ORDER — FLUTICASONE PROPIONATE 50 MCG/ACT NA SUSP
1.0000 | Freq: Every day | NASAL | Status: DC
  Administered 2021-02-01 – 2021-02-10 (×10): 1 via NASAL
  Filled 2021-02-01 (×2): qty 16

## 2021-02-01 MED ORDER — GUAIFENESIN-DM 100-10 MG/5ML PO SYRP
5.0000 mL | ORAL_SOLUTION | ORAL | Status: DC | PRN
  Administered 2021-02-02 – 2021-02-10 (×7): 10 mL via ORAL
  Filled 2021-02-01 (×7): qty 10

## 2021-02-01 MED ORDER — DULOXETINE HCL 30 MG PO CPEP
60.0000 mg | ORAL_CAPSULE | Freq: Every day | ORAL | Status: DC
  Administered 2021-02-01 – 2021-02-11 (×11): 60 mg via ORAL
  Filled 2021-02-01 (×10): qty 2
  Filled 2021-02-01: qty 1

## 2021-02-01 MED ORDER — SODIUM CHLORIDE 0.9% FLUSH
3.0000 mL | Freq: Two times a day (BID) | INTRAVENOUS | Status: DC
  Administered 2021-02-01 – 2021-02-03 (×6): 3 mL via INTRAVENOUS

## 2021-02-01 MED ORDER — GUAIFENESIN ER 600 MG PO TB12
600.0000 mg | ORAL_TABLET | Freq: Two times a day (BID) | ORAL | Status: DC
  Administered 2021-02-01 – 2021-02-05 (×10): 600 mg via ORAL
  Filled 2021-02-01 (×10): qty 1

## 2021-02-01 MED ORDER — AZITHROMYCIN 250 MG PO TABS
500.0000 mg | ORAL_TABLET | Freq: Every day | ORAL | Status: DC
  Administered 2021-02-01 – 2021-02-02 (×2): 500 mg via ORAL
  Filled 2021-02-01: qty 1
  Filled 2021-02-01: qty 2
  Filled 2021-02-01: qty 1

## 2021-02-01 MED ORDER — BUDESON-GLYCOPYRROL-FORMOTEROL 160-9-4.8 MCG/ACT IN AERO: 2.0000 | INHALATION_SPRAY | Freq: Two times a day (BID) | RESPIRATORY_TRACT | Status: DC

## 2021-02-01 MED ORDER — SODIUM CHLORIDE 0.9 % IV SOLN
250.0000 mL | INTRAVENOUS | Status: DC | PRN
Start: 2021-02-01 — End: 2021-02-03

## 2021-02-01 MED ORDER — PANTOPRAZOLE SODIUM 40 MG IV SOLR
40.0000 mg | INTRAVENOUS | Status: DC
  Administered 2021-02-01: 40 mg via INTRAVENOUS
  Filled 2021-02-01: qty 40

## 2021-02-01 MED ORDER — ALBUTEROL SULFATE HFA 108 (90 BASE) MCG/ACT IN AERS
2.0000 | INHALATION_SPRAY | Freq: Four times a day (QID) | RESPIRATORY_TRACT | Status: DC | PRN
Start: 2021-02-01 — End: 2021-02-03
  Filled 2021-02-01: qty 6.7

## 2021-02-01 MED ORDER — PANTOPRAZOLE SODIUM 40 MG PO TBEC
40.0000 mg | DELAYED_RELEASE_TABLET | Freq: Every day | ORAL | Status: DC
  Administered 2021-02-01 – 2021-02-06 (×6): 40 mg via ORAL
  Filled 2021-02-01 (×6): qty 1

## 2021-02-01 MED ORDER — GABAPENTIN 300 MG PO CAPS
300.0000 mg | ORAL_CAPSULE | Freq: Three times a day (TID) | ORAL | Status: DC
  Administered 2021-02-01 – 2021-02-11 (×30): 300 mg via ORAL
  Filled 2021-02-01 (×30): qty 1

## 2021-02-01 MED ORDER — HYDROCODONE-ACETAMINOPHEN 5-325 MG PO TABS
1.0000 | ORAL_TABLET | ORAL | Status: DC | PRN
Start: 2021-02-01 — End: 2021-02-11
  Administered 2021-02-01 – 2021-02-09 (×7): 1 via ORAL
  Filled 2021-02-01 (×7): qty 1

## 2021-02-01 MED ORDER — MOMETASONE FURO-FORMOTEROL FUM 200-5 MCG/ACT IN AERO
2.0000 | INHALATION_SPRAY | Freq: Two times a day (BID) | RESPIRATORY_TRACT | Status: DC
  Administered 2021-02-02 – 2021-02-04 (×4): 2 via RESPIRATORY_TRACT
  Filled 2021-02-01 (×2): qty 8.8

## 2021-02-01 MED ORDER — ATORVASTATIN CALCIUM 20 MG PO TABS
40.0000 mg | ORAL_TABLET | Freq: Every day | ORAL | Status: DC
  Administered 2021-02-01 – 2021-02-11 (×11): 40 mg via ORAL
  Filled 2021-02-01 (×11): qty 2

## 2021-02-01 MED ORDER — METHYLPREDNISOLONE SODIUM SUCC 40 MG IJ SOLR
40.0000 mg | Freq: Two times a day (BID) | INTRAMUSCULAR | Status: DC
Start: 2021-02-01 — End: 2021-02-04
  Administered 2021-02-01 – 2021-02-04 (×7): 40 mg via INTRAVENOUS
  Filled 2021-02-01 (×8): qty 1

## 2021-02-01 MED ORDER — SODIUM CHLORIDE 0.9% FLUSH: 3.0000 mL | INTRAVENOUS | Status: DC | PRN

## 2021-02-01 MED ORDER — SODIUM CHLORIDE 0.9 % IV SOLN
1.0000 g | INTRAVENOUS | Status: DC
  Administered 2021-02-01 – 2021-02-03 (×3): 1 g via INTRAVENOUS
  Filled 2021-02-01 (×3): qty 10
  Filled 2021-02-01: qty 1
  Filled 2021-02-01 (×2): qty 10

## 2021-02-01 MED ORDER — SODIUM CHLORIDE 0.9 % IV SOLN
100.0000 mg | Freq: Every day | INTRAVENOUS | Status: DC
  Administered 2021-02-02 – 2021-02-03 (×2): 100 mg via INTRAVENOUS
  Filled 2021-02-01 (×2): qty 20

## 2021-02-01 MED ORDER — OXYBUTYNIN CHLORIDE ER 5 MG PO TB24
15.0000 mg | ORAL_TABLET | Freq: Every day | ORAL | Status: DC
  Administered 2021-02-01 – 2021-02-11 (×11): 15 mg via ORAL
  Filled 2021-02-01 (×12): qty 3

## 2021-02-01 NOTE — ED Notes (Signed)
Informed RN bed assigned 

## 2021-02-01 NOTE — H&P (Signed)
History and Physical    Denise Watts GNF:[id number] DOB: 12-20-1955 DOA: 01/31/2021  PCP: Margo Common, PA-C    Patient coming from:  Home   Chief Complaint:  Shortness of breath   HPI: Denise Watts is a 65 y.o. female seen in ed with complaints of seen in the emergency room for complaints of shortness of breath.  Patient had COVID symptoms with cough congestion for about 10 days now and was diagnosed with COVID on multiple home test and was seen in the emergency room on 01/27/2021 was diagnosed with COVID as well. Patient denies any chest pain fevers chills nausea vomiting diarrhea abdominal issues headaches vision gait or speech issues and review of systems is otherwise negative except for  Grandchildren and husband have been sick.Worsening sob since past few days.   Pt has past medical history of allergy to sulfa antibiotics, emphysema, anxiety, dysphagia, B12 and vitamin D deficiency, right adrenal mass, history of tobacco abuse, dyslipidemia.  ED Course:  Vitals:   01/31/21 2114 01/31/21 2121 01/31/21 2334 02/01/21 0015  BP: 118/70  130/88 (!) 145/80  Pulse: 90 98 87 80  Resp: (!) 22  20 20   Temp:      TempSrc:      SpO2: (!) 89% 97% 98% 99%  Weight:      Height:      In the emergency room patient is initially found to be hypoxic and dyspneic with a rate of 22 and started on oxygen at 2 L nasal cannula.  Patient also has 2 SIRS criteria.  Initial blood work shows normal CMP with a glucose of 161, normal CBC except for mild white count of 11.8 hemoglobin of 13.7 normal platelets of 210.  Urinalysis shows nitrite positive urine with 6-10 WBCs.  Patient given half a liter normal saline bolus and CT angio done negative for PE, small hiatal hernia, aortic atherosclerosis.  Review of Systems:  Review of Systems  Constitutional:  Positive for malaise/fatigue.  Respiratory:  Positive for cough and shortness of breath.   All other systems reviewed and are negative.   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   Cough 03/02/2017   INTERMTTENT DRY COUGH   Depression    GERD (gastroesophageal reflux disease)    History of hiatal hernia    Personal history of chemotherapy    Personal history of radiation therapy    Skin cancer    Throat cancer ([place]) 1998   radiation    Past Surgical History:  Procedure Laterality Date   BREAST BIOPSY Right 1997   positive   BREAST BIOPSY Right 11/15/2017   INVASIVE MAMMARY CARCINOMA triple negative   BREAST BIOPSY Right 07/20/2020   [place] bx, Q marker, path pending   BREAST LUMPECTOMY Right 1997   2019 also   BREAST LUMPECTOMY WITH SENTINEL LYMPH NODE BIOPSY Right 12/08/2017   Procedure: BREAST LUMPECTOMY WITH SENTINEL LYMPH NODE BX;  Surgeon: Robert Bellow, MD;  Location: [facility];  Service: General;  Laterality: Right;   BREAST REDUCTION WITH MASTOPEXY Left 06/10/2019   Procedure: Left breast mastopexy reduction for symmetry;  Surgeon: Wallace Going, DO;  Location: [place];  Service: Plastics;  Laterality: Left;  total 2.5 hours   COLONOSCOPY  2015   COLONOSCOPY WITH PROPOFOL N/A 10/29/2019   Procedure: COLONOSCOPY WITH PROPOFOL;  Surgeon: Lucilla Lame, MD;  Location: [facility] END[facility]COPY;  Service: Endoscopy;  Laterality: N/A;   ESOPHAGOGASTRODUODENOSCOPY (EGD) WITH PROPOFOL N/A 05/24/2019   Procedure: ESOPHAGOGASTRODUODENOSCOPY (EGD) WITH PROPOFOL;  Surgeon: Lucilla Lame, MD;  Location: [facility] END[facility]COPY;  Service: Endoscopy;  Laterality: N/A;   ESOPHAGUS SURGERY     KNEE SURGERY Right    LIPOSUCTION WITH LIPOFILLING Right 06/10/2019   Procedure: right breast scar and capsule release with fat grafting;  Surgeon: Wallace Going, DO;  Location: [place];   Service: Plastics;  Laterality: Right;   PORT-A-CATH REMOVAL     PORTACATH PLACEMENT Right 01/17/2018   Procedure: INSERTION PORT-A-CATH- RIGHT;  Surgeon: Robert Bellow, MD;  Location: [facility];  Service: General;  Laterality: Right;   ROBOTIC ADRENALECTOMY Right 12/28/2016   Procedure: ROBOTIC ADRENALECTOMY;  Surgeon: Hollice Espy, MD;  Location: [facility];  Service: Urology;  Laterality: Right;   SKIN CANCER EXCISION     THROAT SURGERY  1998   throat cancer    TUBAL LIGATION     VENTRAL HERNIA REPAIR N/A 03/03/2017   Procedure: HERNIA REPAIR VENTRAL ADULT;  Surgeon: Clayburn Pert, MD;  Location: [facility];  Service: General;  Laterality: N/A;    reports that she quit smoking about 25 years ago. Her smoking use included cigarettes. She has a 45.00 pack-year smoking history. She has never used smokeless tobacco. She reports current alcohol use of about 4.0 standard drinks per week. She reports that she does not use drugs.  Allergies  Allergen Reactions   Sulfa Antibiotics Other (See Comments)    Mouth sore and raw    Family History  Problem Relation Age of Onset   Diabetes Mother    Heart attack Mother    Cancer Sister        cancer of eyes, spread to lung, other places. died at 77   Cancer Sister 32       unknown primary, spread to bone, brain died in 4's   Cancer Paternal Aunt        unk type   Cancer Paternal Grandmother        type unk   Prostate cancer Neg Hx    Kidney cancer Neg Hx    Bladder Cancer Neg Hx     Prior to Admission medications   Medication Sig Start Date End Date Taking? Authorizing Provider  albuterol (VENTOLIN HFA) 108 (90 Base) MCG/ACT inhaler Inhale 2 puffs into the lungs every 6 (six) hours as needed for shortness of breath. 01/29/21   Menshew, Dannielle Karvonen, PA-C  aspirin EC 81 MG tablet Take 1 tablet (81 mg total) by mouth daily. Swallow whole. 10/19/20   Kate Sable, MD  atorvastatin (LIPITOR) 40 MG tablet Take 1 tablet (40 mg total)  by mouth daily. 12/15/20 03/15/21  Kate Sable, MD  benzonatate (TESSALON PERLES) 100 MG capsule Take 1-2 tabs TID prn cough 01/29/21   Menshew, Dannielle Karvonen, PA-C  brompheniramine-pseudoephedrine-DM 30-2-10 MG/5ML syrup Take 5 mLs by mouth 4 (four) times daily as needed. 01/29/21   Menshew, Dannielle Karvonen, PA-C  Budeson-Glycopyrrol-Formoterol (BREZTRI AEROSPHERE) 160-9-4.8 MCG/ACT AERO Inhale 2 puffs into the lungs in the morning and at bedtime. 08/06/20   Chesley Mires, MD  busPIRone (BUSPAR) 5 MG tablet Take 1 tablet (5 mg total) by mouth 2 (two) times daily. 03/03/20   Chrismon, Simona Huh  E, PA-C  cyclobenzaprine (FLEXERIL) 5 MG tablet TAKE 1 TABLET BY MOUTH 3 TIMES DAILY AS NEEDED FOR MUSCLE SPASMS 01/19/21   Birdie Sons, MD  DULoxetine (CYMBALTA) 60 MG capsule Take by mouth. 08/27/20 08/27/21  [provider]  fluticasone (FLONASE) 50 MCG/ACT nasal spray Place 1 spray into both nostrils at bedtime. 06/19/20   Mar Daring, PA-C  gabapentin (NEURONTIN) 300 MG capsule TAKE 1 CAPSULE BY MOUTH 3 TIMES A DAY 11/17/20   Chrismon, Vickki Muff, PA-C  HYDROcodone-acetaminophen (NORCO/VICODIN) 5-325 MG tablet Take 1 tablet by mouth every 4 (four) hours as needed for moderate pain. 01/12/21   Jerrol Banana., MD  omeprazole (PRILOSEC) 40 MG capsule Take 40 mg by mouth in the morning and at bedtime.    [provider]  ondansetron (ZOFRAN ODT) 4 MG disintegrating tablet Take 1 tablet (4 mg total) by mouth every 8 (eight) hours as needed. 01/29/21   Menshew, Dannielle Karvonen, PA-C  ondansetron (ZOFRAN) 4 MG tablet Take 1 tablet (4 mg total) by mouth every 8 (eight) hours as needed for nausea or vomiting. 06/06/19   Scheeler, Carola Rhine, PA-C  oxybutynin (DITROPAN XL) 15 MG 24 hr tablet Take 1 tablet (15 mg total) by mouth daily. 12/09/20   Hollice Espy, MD  predniSONE (DELTASONE) 20 MG tablet Take 2 tablets (40 mg total) by mouth daily with breakfast for 5 days. 01/29/21 02/03/21   Menshew, Dannielle Karvonen, PA-C  rOPINIRole (REQUIP) 0.25 MG tablet TAKE 1 TO 2 TABLETS BY MOUTH AT BEDTIME FOR RESTLESS LEGS 01/18/21   Tania Ade    Physical Exam: Vitals:   01/31/21 2114 01/31/21 2121 01/31/21 2334 02/01/21 0015  BP: 118/70  130/88 (!) 145/80  Pulse: 90 98 87 80  Resp: (!) 22  20 20   Temp:      TempSrc:      SpO2: (!) 89% 97% 98% 99%  Weight:      Height:       Physical Exam Vitals reviewed.  Constitutional:      General: She is not in acute distress.    Appearance: She is not ill-appearing.  HENT:     Head: Normocephalic and atraumatic.     Right Ear: External ear normal.     Left Ear: External ear normal.     Mouth/Throat:     Mouth: Mucous membranes are moist.  Eyes:     Extraocular Movements: Extraocular movements intact.     Pupils: Pupils are equal, round, and reactive to light.  Cardiovascular:     Rate and Rhythm: Normal rate and regular rhythm.     Pulses: Normal pulses.     Heart sounds: Normal heart sounds.  Pulmonary:     Effort: Pulmonary effort is normal.     Breath sounds: Wheezing and rhonchi present.  Abdominal:     General: Bowel sounds are normal. There is no distension.     Palpations: Abdomen is soft. There is no mass.     Tenderness: There is no abdominal tenderness. There is no guarding.  Musculoskeletal:     Right lower leg: No edema.     Left lower leg: No edema.  Skin:    General: Skin is warm.  Neurological:     General: No focal deficit present.     Mental Status: She is alert and oriented to person, place, and time.  Psychiatric:        Mood and Affect: Mood normal.  Behavior: Behavior normal.   Labs on Admission: I have personally reviewed following labs and imaging studies  No results for input(s): CKTOTAL, CKMB, TROPONINI in the last 72 hours. Lab Results  Component Value Date   WBC 11.8 (H) 01/31/2021   HGB 13.7 01/31/2021   HCT 40.8 01/31/2021   MCV 96.0 01/31/2021   PLT 210 01/31/2021     Recent Labs  Lab 01/31/21 1910  NA 138  K 3.9  CL 104  CO2 25  BUN 13  CREATININE 0.74  CALCIUM 9.0  PROT 7.3  BILITOT 0.7  ALKPHOS 81  ALT 17  AST 24  GLUCOSE 161*   Lab Results  Component Value Date   CHOL 236 (H) 12/14/2020   HDL 47 12/14/2020   LDLCALC 155 (H) 12/14/2020   TRIG 186 (H) 12/14/2020   No results found for: DDIMER Invalid input(s): POCBNP  Urinalysis    Component Value Date/Time   COLORURINE AMBER (A) 01/31/2021 2341   APPEARANCEUR CLEAR (A) 01/31/2021 2341   APPEARANCEUR Clear 01/05/2021 1629   LABSPEC >1.046 (H) 01/31/2021 2341   PHURINE 5.0 01/31/2021 2341   GLUCOSEU NEGATIVE 01/31/2021 2341   HGBUR NEGATIVE 01/31/2021 2341   BILIRUBINUR NEGATIVE 01/31/2021 2341   BILIRUBINUR Negative 01/05/2021 [place] 01/31/2021 2341   PROTEINUR NEGATIVE 01/31/2021 2341   UROBILINOGEN 0.2 10/15/2020 1015   NITRITE POSITIVE (A) 01/31/2021 2341   LEUKOCYTESUR NEGATIVE 01/31/2021 2341    COVID-19 Labs  No results for input(s): DDIMER, FERRITIN, LDH, CRP in the last 72 hours.  Lab Results  Component Value Date   [place] (A) 01/29/2021   [place] NEGATIVE 08/24/2020   [place] Not Detected 02/04/2020   [place] NEGATIVE 01/20/2020    Radiological Exams on Admission: CT Angio Chest PE W and/or Wo Contrast  Result Date: 01/31/2021 CLINICAL DATA:  Ongoing symptoms of COVID. EXAM: CT ANGIOGRAPHY CHEST WITH CONTRAST TECHNIQUE: Multidetector CT imaging of the chest was performed using the standard protocol during bolus administration of intravenous contrast. Multiplanar CT image reconstructions and MIPs were obtained to evaluate the vascular anatomy. CONTRAST:  56mL OMNIPAQUE IOHEXOL 350 MG/ML SOLN COMPARISON:  August 21, 2020 FINDINGS: Cardiovascular: There is moderate severity calcification of the aortic arch, without evidence of aortic aneurysm or dissection. Satisfactory opacification of the pulmonary arteries to  the segmental level. No evidence of pulmonary embolism. Normal heart size. No pericardial effusion. Mediastinum/Nodes: No enlarged mediastinal, hilar, or axillary lymph nodes. Thyroid gland, trachea, and esophagus demonstrate no significant findings. Lungs/Pleura: Mild atelectasis is seen within the posterior aspects of the bilateral lung left greater than right. There is no evidence of acute infiltrate, pleural effusion or pneumothorax. Upper Abdomen: There is a small hiatal hernia. A 1.3 cm diameter simple cyst is seen along the anterior aspect of the upper pole of the left kidney. Musculoskeletal: A 4.9 cm x 2.3 cm lipoma is seen along the anterolateral aspect of the lower right chest wall. No acute osseous abnormalities are identified. Review of the MIP images confirms the above findings. IMPRESSION: 1. No CT evidence of pulmonary embolism or acute cardiopulmonary disease. 2. Small hiatal hernia. 3. 4.9 cm x 2.3 cm lower right chest wall lipoma. 4. Aortic atherosclerosis. Aortic Atherosclerosis (ICD10-I70.0). Electronically Signed   By: Virgina Norfolk M.D.   On: 01/31/2021 20:23    EKG: Independently reviewed.  None     Assessment/Plan Principal Problem:   SOB (shortness of breath) Active Problems:   Acute respiratory failure with hypoxia (HCC)  Acute hypoxemic respiratory failure due to COVID-19 (HCC)   Centrilobular emphysema (HCC)   Anxiety and depression   Acid reflux Shortness of breath/acute hypoxemic respiratory failure due to COVID-19/emphysema: Differentials in this patient include hypoxemic respiratory failure secondary to COVID-19 infection versus emphysema and COPD exacerbation versus development of bronchitis or infection. Will admit patient to progressive care unit with continuous cardiac monitoring and pulse oximetry with all vitals. COVID isolation with airborne and droplet precautions. Patient's infection has started over 10 days and does not meet criteria for remdesivir  therapy. We will continue patient on MDIs and steroid therapy. Incentive spirometry and supplemental oxygen for goal O2 sats of 90% and above. Venous blood gas obtained for PCO2 and PO2 level and is pending.  Anxiety and depression: Patient's home regimen with BuSpar and Cymbalta continued.  Acid reflux: IV PPI therapy continued.   DVT prophylaxis:  Heparin    Code Status:  Full code    Family Communication:  Sulewski,Joseph (Spouse)  [phone] (Mobile)   Disposition Plan:  Home    Consults called:  None   Admission status: Inpatient     Para Skeans MD [facility] [phone] How to contact the [facility] Attending or Consulting provider [place] or covering provider during after hours [place], for this patient.    Check the care team in [facility] and look for a) attending/consulting [facility] provider listed and b) the [facility] team listed Log into [email or web address] and use [employer]'s universal password to access. If you do not have the password, please contact the hospital operator. Locate the [facility] provider you are looking for under [facility] and page to a number that you can be directly reached. If you still have difficulty reaching the provider, please page the [facility] (Director on Call) for the Hospitalists listed on amion for assistance. [email or web address] Password [email or web address] 02/01/2021, 1:30 AM

## 2021-02-01 NOTE — ED Notes (Signed)
Pt given lunch tray.

## 2021-02-01 NOTE — Consult Note (Signed)
Remdesivir - Pharmacy Brief Note   O:  ALT: 18 CXR: Stable 1.5 cm left lower lobe pulmonary nodule, unchanged since 2020. The remaining pulmonary nodule seen on the recent chest CT 08/21/2020 are not readily apparent by x-ray. SpO2: 98% on 2LNC   A/P:  Remdesivir 200 mg IVPB once followed by 100 mg IVPB daily x 4 days.   Pearla Dubonnet, PharmD Clinical Pharmacist 02/01/2021 1:27 PM

## 2021-02-01 NOTE — ED Notes (Signed)
Dinner tray brought to pt & pt repositioned. No additional needs verbalized at this time. Pt updated on status of inpatient room. Bed low & locked; call light & personal items within reach.

## 2021-02-01 NOTE — Progress Notes (Signed)
PROGRESS NOTE    Denise Watts  YWV:[id number] DOB: Feb 09, 1956 DOA: 01/31/2021 PCP: Margo Common, PA-C    Brief Narrative:   Denise Watts is a 65 y.o. female emphysema, anxiety, dysphagia, B12 and vitamin D deficiency, right adrenal mass, history of tobacco abuse, dyslipidemia.seen in ed with complaints of seen in the emergency room for complaints of shortness of breath.  Patient had COVID symptoms with cough congestion for about 10 days now and was diagnosed with COVID on multiple home test and was seen in the emergency room on 01/29/2021 was diagnosed with COVID as well.    Consultants:    Procedures:   Antimicrobials:      Subjective: Feels weak. On 2LNC. Still sob. Feels body ach.   Objective: Vitals:   02/01/21 0800 02/01/21 1000 02/01/21 1030 02/01/21 1200  BP: (!) 155/86 (!) 123/95 117/76 (!) 104/58  Pulse: 88 91 (!) 103 92  Resp: 20  19 19   Temp:      TempSrc:      SpO2: 100% 98% 97% 94%  Weight:      Height:        Intake/Output Summary (Last 24 hours) at 02/01/2021 1212 Last data filed at 02/01/2021 0038 Gross per 24 hour  Intake 500 ml  Output --  Net 500 ml   Filed Weights   01/31/21 1903  Weight: 62 kg    Examination:  General exam: Appears calm and comfortable , sounds congested Respiratory system: decrease bs, no wheezing Cardiovascular system: S1 & S2 heard, RRR. No JVD, murmurs, rubs, gallops or clicks.  Gastrointestinal system: Abdomen is nondistended, soft and nontender. Normal bowel sounds heard. Central nervous system: Alert and oriented. Grossly intact Extremities: no edema Psychiatry:Mood & affect appropriate.     Data Reviewed: I have personally reviewed following labs and imaging studies  CBC: Recent Labs  Lab 01/31/21 1910 02/01/21 0447  WBC 11.8* 10.6*  NEUTROABS 10.6*  --   HGB 13.7 13.9  HCT 40.8 41.4  MCV 96.0 96.5  PLT 210 854   Basic Metabolic Panel: Recent Labs  Lab 01/31/21 1910 02/01/21 0447  NA 138  141  K 3.9 4.2  CL 104 108  CO2 25 28  GLUCOSE 161* 141*  BUN 13 10  CREATININE 0.74 0.77  CALCIUM 9.0 8.9   GFR: Estimated Creatinine Clearance: 57.6 mL/min (by C-G formula based on SCr of 0.77 mg/dL). Liver Function Tests: Recent Labs  Lab 01/31/21 1910 02/01/21 0447  AST 24 22  ALT 17 18  ALKPHOS 81 78  BILITOT 0.7 0.6  PROT 7.3 7.0  ALBUMIN 3.7 3.5   Recent Labs  Lab 01/31/21 1910  LIPASE 24   No results for input(s): AMMONIA in the last 168 hours. Coagulation Profile: No results for input(s): INR, PROTIME in the last 168 hours. Cardiac Enzymes: No results for input(s): CKTOTAL, CKMB, CKMBINDEX, TROPONINI in the last 168 hours. BNP (last 3 results) No results for input(s): PROBNP in the last 8760 hours. HbA1C: No results for input(s): HGBA1C in the last 72 hours. CBG: No results for input(s): GLUCAP in the last 168 hours. Lipid Profile: No results for input(s): CHOL, HDL, LDLCALC, TRIG, CHOLHDL, LDLDIRECT in the last 72 hours. Thyroid Function Tests: No results for input(s): TSH, T4TOTAL, FREET4, T3FREE, THYROIDAB in the last 72 hours. Anemia Panel: No results for input(s): VITAMINB12, FOLATE, FERRITIN, TIBC, IRON, RETICCTPCT in the last 72 hours. Sepsis Labs: Recent Labs  Lab 02/01/21 0056 02/01/21 0447  LATICACIDVEN  1.1 1.6    Recent Results (from the past 240 hour(s))  SARS CORONAVIRUS 2 (TAT 6-24 HRS) Nasopharyngeal Nasopharyngeal Swab     Status: Abnormal   Collection Time: 01/29/21  6:05 PM   Specimen: Nasopharyngeal Swab  Result Value Ref Range Status   SARS Coronavirus 2 POSITIVE (A) NEGATIVE Final    Comment: (NOTE) SARS-CoV-2 target nucleic acids are DETECTED.  The SARS-CoV-2 RNA is generally detectable in upper and lower respiratory specimens during the acute phase of infection. Positive results are indicative of the presence of SARS-CoV-2 RNA. Clinical correlation with patient history and other diagnostic information is  necessary to  determine patient infection status. Positive results do not rule out bacterial infection or co-infection with other viruses.  The expected result is Negative.  Fact Sheet for Patients: SugarRoll.be  Fact Sheet for Healthcare Providers: https://www.woods-mathews.com/  This test is not yet approved or cleared by the [place] FDA and  has been authorized for detection and/or diagnosis of SARS-CoV-2 by FDA under an Emergency Use Authorization (EUA). This EUA will remain  in effect (meaning this test can be used) for the duration of the COVID-19 declaration under Section 564(b)(1) of the Act, 21 U. S.C. section 360bbb-3(b)(1), unless the authorization is terminated or revoked sooner.   Performed at [facility], [place] [address], [place], [place] [address]   Blood culture (routine x 2)     Status: None (Preliminary result)   Collection Time: 02/01/21 12:56 AM   Specimen: BLOOD  Result Value Ref Range Status   Specimen Description BLOOD LEFT ANTECUBITAL  Final   Special Requests   Final    BOTTLES DRAWN AEROBIC AND ANAEROBIC Blood Culture adequate volume   Culture   Final    NO GROWTH < 12 HOURS Performed at [facility] [place] [facility], [address], [place], [place] [address]    Report Status PENDING  Incomplete  Culture, blood (Routine X 2) w Reflex to ID Panel     Status: None (Preliminary result)   Collection Time: 02/01/21  1:42 AM   Specimen: BLOOD  Result Value Ref Range Status   Specimen Description BLOOD LEFT FOREARM  Final   Special Requests   Final    BOTTLES DRAWN AEROBIC AND ANAEROBIC Blood Culture adequate volume   Culture   Final    NO GROWTH < 12 HOURS Performed at [facility], [address] [place], [place] [address]    Report Status PENDING  Incomplete         Radiology Studies: CT Angio Chest PE W and/or Wo Contrast  Result Date: 01/31/2021 CLINICAL DATA:  Ongoing symptoms of COVID. EXAM: CT  ANGIOGRAPHY CHEST WITH CONTRAST TECHNIQUE: Multidetector CT imaging of the chest was performed using the standard protocol during bolus administration of intravenous contrast. Multiplanar CT image reconstructions and MIPs were obtained to evaluate the vascular anatomy. CONTRAST:  57mL OMNIPAQUE IOHEXOL 350 MG/ML SOLN COMPARISON:  August 21, 2020 FINDINGS: Cardiovascular: There is moderate severity calcification of the aortic arch, without evidence of aortic aneurysm or dissection. Satisfactory opacification of the pulmonary arteries to the segmental level. No evidence of pulmonary embolism. Normal heart size. No pericardial effusion. Mediastinum/Nodes: No enlarged mediastinal, hilar, or axillary lymph nodes. Thyroid gland, trachea, and esophagus demonstrate no significant findings. Lungs/Pleura: Mild atelectasis is seen within the posterior aspects of the bilateral lung left greater than right. There is no evidence of acute infiltrate, pleural effusion or pneumothorax. Upper Abdomen: There is a small hiatal hernia.  A 1.3 cm diameter simple cyst is seen along the anterior aspect of the upper pole of the left kidney. Musculoskeletal: A 4.9 cm x 2.3 cm lipoma is seen along the anterolateral aspect of the lower right chest wall. No acute osseous abnormalities are identified. Review of the MIP images confirms the above findings. IMPRESSION: 1. No CT evidence of pulmonary embolism or acute cardiopulmonary disease. 2. Small hiatal hernia. 3. 4.9 cm x 2.3 cm lower right chest wall lipoma. 4. Aortic atherosclerosis. Aortic Atherosclerosis (ICD10-I70.0). Electronically Signed   By: Virgina Norfolk M.D.   On: 01/31/2021 20:23        Scheduled Meds:  aspirin EC  81 mg Oral Daily   atorvastatin  40 mg Oral Daily   azithromycin  500 mg Oral Daily   Budeson-Glycopyrrol-Formoterol  2 puff Inhalation BID   busPIRone  5 mg Oral BID   DULoxetine  60 mg Oral Daily   fluticasone  1 spray Each Nare QHS   gabapentin  300 mg  Oral TID   heparin  5,000 Units Subcutaneous Q8H   methylPREDNISolone (SOLU-MEDROL) injection  40 mg Intravenous Q12H   oxybutynin  15 mg Oral Daily   pantoprazole (PROTONIX) IV  40 mg Intravenous Q24H   sodium chloride flush  3 mL Intravenous Q12H   Continuous Infusions:  sodium chloride     cefTRIAXone (ROCEPHIN)  IV Stopped (02/01/21 0227)    Assessment & Plan:   Principal Problem:   SOB (shortness of breath) Active Problems:   Acid reflux   Anxiety and depression   Centrilobular emphysema (HCC)   Acute hypoxemic respiratory failure due to COVID-19 [facility])   Acute respiratory failure with hypoxia (HCC)   Acute hypoxic respiratory failure Due to covid 19 infection Apparently was hypoxic on presentation, now on 2 L sats above 92% Continue IV steroids Discussed with patient about IV remdesivir including side effects she is agreeable to start this medication currently on Rocephin and azithromycin.  Will check procalcitonin if negative will discontinue Add mucinex  Anxiety and depression Continue BuSpar and Cymbalta   GERD continue PPI, change from IV to po     DVT prophylaxis: Heparin Code Status: Full Family Communication: None at bedside Disposition Plan:  Status is: Inpatient  Remains inpatient appropriate because:Inpatient level of care appropriate due to severity of illness  Dispo: The patient is from: Home              Anticipated d/c is to: Home              Patient currently is not medically stable to d/c.   Difficult to place patient No            LOS: 0 days   Time spent: 35 minutes with more than 50% on [place], MD [facility] Pager [phone]  If 7PM-7AM, please contact night-coverage 02/01/2021, 12:12 PM

## 2021-02-01 NOTE — ED Notes (Signed)
SBAR sent via secure msg to Barbra Sarks., RN

## 2021-02-01 NOTE — ED Notes (Signed)
Respiratory therapy notified of order for ABG

## 2021-02-01 NOTE — ED Notes (Signed)
Pt resting comfortably in bed, NAD. Pt brought bandaid per request-no open wounds noted. Pt repositioned for comfort. No additional needs verbalized at this time. Bed low & locked; call light & personal items within reach.

## 2021-02-02 ENCOUNTER — Encounter: Payer: Self-pay | Admitting: Internal Medicine

## 2021-02-02 DIAGNOSIS — R0602 Shortness of breath: Secondary | ICD-10-CM | POA: Diagnosis not present

## 2021-02-02 LAB — URINE CULTURE: Culture: >=100000 — AB

## 2021-02-02 MED ORDER — CYCLOBENZAPRINE HCL 10 MG PO TABS
5.0000 mg | ORAL_TABLET | Freq: Three times a day (TID) | ORAL | Status: DC | PRN
  Administered 2021-02-02 – 2021-02-10 (×8): 5 mg via ORAL
  Filled 2021-02-02 (×10): qty 1

## 2021-02-02 MED ORDER — ROPINIROLE HCL 0.25 MG PO TABS
0.2500 mg | ORAL_TABLET | Freq: Every day | ORAL | Status: DC
  Administered 2021-02-02 – 2021-02-10 (×9): 0.25 mg via ORAL
  Filled 2021-02-02 (×10): qty 1

## 2021-02-02 NOTE — Progress Notes (Signed)
PROGRESS NOTE    Denise Watts  YTK:[id number] DOB: [birth year] DOA: 01/31/2021 PCP: Denise Common, PA-C    Brief Narrative:   Denise Watts is a 65 y.o. female emphysema, anxiety, dysphagia, B12 and vitamin D deficiency, right adrenal mass, history of tobacco abuse, dyslipidemia.seen in ed with complaints of seen in the emergency room for complaints of shortness of breath.  Patient had COVID symptoms with cough congestion for about 10 days now and was diagnosed with COVID on multiple home test and was seen in the emergency room on 01/29/2021 was diagnosed with COVID as well.  10/4 feels congested, +courgh . Sob mildly better. On [facility]    Consultants:    Procedures:   Antimicrobials:  Remdesivir   Subjective: No cp, feels weak, lots of cough  Objective: Vitals:   02/01/21 1745 02/01/21 2100 02/02/21 0115 02/02/21 0818  BP:  125/78 (!) 150/88 (!) 142/91  Pulse:  87 89 94  Resp:   15 18  Temp: 98.3 F (36.8 C) 98.6 F (37 C) 98.3 F (36.8 C) (!) 97.5 F (36.4 C)  TempSrc: Oral Oral Oral   SpO2:  98% 99% 95%  Weight:      Height:        Intake/Output Summary (Last 24 hours) at 02/02/2021 0825 Last data filed at 02/01/2021 1559 Gross per 24 hour  Intake 280.83 ml  Output --  Net 280.83 ml   Filed Weights   01/31/21 1903  Weight: 62 kg    Examination: NAD, nontachypneic, sounds congested Mild rhonchi bilaterally no Rales no wheezing RRR S1-S2 no gallops Soft benign positive bowel sounds No edema Aaxox3 Affect appropriate in current setting    Data Reviewed: I have personally reviewed following labs and imaging studies  CBC: Recent Labs  Lab 01/31/21 1910 02/01/21 0447  WBC 11.8* 10.6*  NEUTROABS 10.6*  --   HGB 13.7 13.9  HCT 40.8 41.4  MCV 96.0 96.5  PLT 210 751   Basic Metabolic Panel: Recent Labs  Lab 01/31/21 1910 02/01/21 0447  NA 138 141  K 3.9 4.2  CL 104 108  CO2 25 28  GLUCOSE 161* 141*  BUN 13 10  CREATININE 0.74 0.77   CALCIUM 9.0 8.9   GFR: Estimated Creatinine Clearance: 57.6 mL/min (by C-G formula based on SCr of 0.77 mg/dL). Liver Function Tests: Recent Labs  Lab 01/31/21 1910 02/01/21 0447  AST 24 22  ALT 17 18  ALKPHOS 81 78  BILITOT 0.7 0.6  PROT 7.3 7.0  ALBUMIN 3.7 3.5   Recent Labs  Lab 01/31/21 1910  LIPASE 24   No results for input(s): AMMONIA in the last 168 hours. Coagulation Profile: No results for input(s): INR, PROTIME in the last 168 hours. Cardiac Enzymes: No results for input(s): CKTOTAL, CKMB, CKMBINDEX, TROPONINI in the last 168 hours. BNP (last 3 results) No results for input(s): PROBNP in the last 8760 hours. HbA1C: No results for input(s): HGBA1C in the last 72 hours. CBG: No results for input(s): GLUCAP in the last 168 hours. Lipid Profile: No results for input(s): CHOL, HDL, LDLCALC, TRIG, CHOLHDL, LDLDIRECT in the last 72 hours. Thyroid Function Tests: No results for input(s): TSH, T4TOTAL, FREET4, T3FREE, THYROIDAB in the last 72 hours. Anemia Panel: No results for input(s): VITAMINB12, FOLATE, FERRITIN, TIBC, IRON, RETICCTPCT in the last 72 hours. Sepsis Labs: Recent Labs  Lab 02/01/21 0056 02/01/21 0447  PROCALCITON  --  <0.10  LATICACIDVEN 1.1 1.6    Recent Results (  from the past 240 hour(s))  SARS CORONAVIRUS 2 (TAT 6-24 HRS) Nasopharyngeal Nasopharyngeal Swab     Status: Abnormal   Collection Time: 01/29/21  6:05 PM   Specimen: Nasopharyngeal Swab  Result Value Ref Range Status   SARS Coronavirus 2 POSITIVE (A) NEGATIVE Final    Comment: (NOTE) SARS-CoV-2 target nucleic acids are DETECTED.  The SARS-CoV-2 RNA is generally detectable in upper and lower respiratory specimens during the acute phase of infection. Positive results are indicative of the presence of SARS-CoV-2 RNA. Clinical correlation with patient history and other diagnostic information is  necessary to determine patient infection status. Positive results do not rule out  bacterial infection or co-infection with other viruses.  The expected result is Negative.  Fact Sheet for Patients: SugarRoll.be  Fact Sheet for Healthcare Providers: https://www.woods-mathews.com/  This test is not yet approved or cleared by the [place] FDA and  has been authorized for detection and/or diagnosis of SARS-CoV-2 by FDA under an Emergency Use Authorization (EUA). This EUA will remain  in effect (meaning this test can be used) for the duration of the COVID-19 declaration under Section 564(b)(1) of the Act, 21 U. S.C. section 360bbb-3(b)(1), unless the authorization is terminated or revoked sooner.   Performed at [facility], [place] [address], [place], [place] [address]   Urine Culture     Status: Abnormal   Collection Time: 01/31/21 11:41 PM   Specimen: Urine, Random  Result Value Ref Range Status   Specimen Description   Final    URINE, RANDOM Performed at [facility], [address] [place], [place] [address]    Special Requests   Final    NONE Performed at [facility], [address] [place], [place] [address]    Culture >=100,000 COLONIES/mL ESCHERICHIA COLI (A)  Final   Report Status 02/02/2021 FINAL  Final   Organism ID, Bacteria ESCHERICHIA COLI (A)  Final      Susceptibility   Escherichia coli - MIC*    AMPICILLIN <=2 SENSITIVE Sensitive     CEFAZOLIN <=4 SENSITIVE Sensitive     CEFEPIME <=0.12 SENSITIVE Sensitive     CEFTRIAXONE <=0.25 SENSITIVE Sensitive     CIPROFLOXACIN <=0.25 SENSITIVE Sensitive     GENTAMICIN <=1 SENSITIVE Sensitive     IMIPENEM <=0.25 SENSITIVE Sensitive     NITROFURANTOIN <=16 SENSITIVE Sensitive     TRIMETH/SULFA <=20 SENSITIVE Sensitive     AMPICILLIN/SULBACTAM <=2 SENSITIVE Sensitive     PIP/TAZO <=4 SENSITIVE Sensitive     * >=100,000 COLONIES/mL ESCHERICHIA COLI  Blood culture (routine x 2)     Status: None (Preliminary result)   Collection  Time: 02/01/21 12:56 AM   Specimen: BLOOD  Result Value Ref Range Status   Specimen Description BLOOD LEFT ANTECUBITAL  Final   Special Requests   Final    BOTTLES DRAWN AEROBIC AND ANAEROBIC Blood Culture adequate volume   Culture   Final    NO GROWTH 1 DAY Performed at [facility], [address] [place], [place] [address]    Report Status PENDING  Incomplete  Culture, blood (Routine X 2) w Reflex to ID Panel     Status: None (Preliminary result)   Collection Time: 02/01/21  1:42 AM   Specimen: BLOOD  Result Value Ref Range Status   Specimen Description BLOOD LEFT FOREARM  Final   Special Requests   Final    BOTTLES DRAWN AEROBIC AND ANAEROBIC Blood Culture adequate volume   Culture  Final    NO GROWTH 1 DAY Performed at [facility], [address] [place], [place] [address]    Report Status PENDING  Incomplete         Radiology Studies: CT Angio Chest PE W and/or Wo Contrast  Result Date: 01/31/2021 CLINICAL DATA:  Ongoing symptoms of COVID. EXAM: CT ANGIOGRAPHY CHEST WITH CONTRAST TECHNIQUE: Multidetector CT imaging of the chest was performed using the standard protocol during bolus administration of intravenous contrast. Multiplanar CT image reconstructions and MIPs were obtained to evaluate the vascular anatomy. CONTRAST:  53mL OMNIPAQUE IOHEXOL 350 MG/ML SOLN COMPARISON:  August 21, 2020 FINDINGS: Cardiovascular: There is moderate severity calcification of the aortic arch, without evidence of aortic aneurysm or dissection. Satisfactory opacification of the pulmonary arteries to the segmental level. No evidence of pulmonary embolism. Normal heart size. No pericardial effusion. Mediastinum/Nodes: No enlarged mediastinal, hilar, or axillary lymph nodes. Thyroid gland, trachea, and esophagus demonstrate no significant findings. Lungs/Pleura: Mild atelectasis is seen within the posterior aspects of the bilateral lung left greater than right. There is no  evidence of acute infiltrate, pleural effusion or pneumothorax. Upper Abdomen: There is a small hiatal hernia. A 1.3 cm diameter simple cyst is seen along the anterior aspect of the upper pole of the left kidney. Musculoskeletal: A 4.9 cm x 2.3 cm lipoma is seen along the anterolateral aspect of the lower right chest wall. No acute osseous abnormalities are identified. Review of the MIP images confirms the above findings. IMPRESSION: 1. No CT evidence of pulmonary embolism or acute cardiopulmonary disease. 2. Small hiatal hernia. 3. 4.9 cm x 2.3 cm lower right chest wall lipoma. 4. Aortic atherosclerosis. Aortic Atherosclerosis (ICD10-I70.0). Electronically Signed   By: Virgina Norfolk M.D.   On: 01/31/2021 20:23        Scheduled Meds:  aspirin EC  81 mg Oral Daily   atorvastatin  40 mg Oral Daily   azithromycin  500 mg Oral Daily   busPIRone  5 mg Oral BID   DULoxetine  60 mg Oral Daily   fluticasone  1 spray Each Nare QHS   gabapentin  300 mg Oral TID   guaiFENesin  600 mg Oral BID   heparin  5,000 Units Subcutaneous Q8H   methylPREDNISolone (SOLU-MEDROL) injection  40 mg Intravenous Q12H   mometasone-formoterol  2 puff Inhalation BID   And   umeclidinium bromide  1 puff Inhalation Daily   oxybutynin  15 mg Oral Daily   pantoprazole  40 mg Oral Daily   sodium chloride flush  3 mL Intravenous Q12H   Continuous Infusions:  sodium chloride     cefTRIAXone (ROCEPHIN)  IV 1 g (02/02/21 0126)   remdesivir 100 mg in NS 100 mL      Assessment & Plan:   Principal Problem:   SOB (shortness of breath) Active Problems:   Acid reflux   Anxiety and depression   Centrilobular emphysema (HCC)   Acute hypoxemic respiratory failure due to COVID-19 [facility])   Acute respiratory failure with hypoxia (HCC)   Acute hypoxic respiratory failure Due to covid 19 infection Apparently was hypoxic on presentation, now on 2 L sats above 92% 10/4-minimal improvement Continue IV remdesivir Continue IV  steroid DC azithromycin  as Procalcitonin <0.10 Continue Dulera, MDI, Tessalon, Robitussin  Anxiety and depression Continue Buspar, cymbalta   E.COLI UTI Continue Rocephin IV  GERD  Continue PPI      DVT prophylaxis: Heparin Code Status: Full Family Communication: None at bedside  Disposition Plan:  Status is: Inpatient  Remains inpatient appropriate because:Inpatient level of care appropriate due to severity of illness  Dispo: The patient is from: Home              Anticipated d/c is to: Home              Patient currently is not medically stable to d/c.   Difficult to place patient No            LOS: 1 day   Time spent: 35 minutes with more than 50% on [place], MD [facility] Pager [phone]  If 7PM-7AM, please contact night-coverage 02/02/2021, 8:25 AM

## 2021-02-02 NOTE — Progress Notes (Signed)
Mobility Specialist - Progress Note   02/02/21 1131  Mobility  Activity Ambulated in room;Ambulated to bathroom  Level of Assistance Standby assist, set-up cues, supervision of patient - no hands on  Assistive Device None  Distance Ambulated (ft) 70 ft  Mobility Ambulated with assistance in room;Out of bed for toileting  Mobility Response Tolerated well  Mobility performed by Mobility specialist  $Mobility charge 1 Mobility    Pre-mobility: 107 HR, 97% SpO2 During mobility: 114 HR, 96% SpO2 Post-mobility: 103 HR, 98% SpO2   Pt lying in bed utilizing 2L. Ambulated to bathroom prior to continuation of activity. Independent in peri-care. O2 maintained > 95% on RA. Further activity limited d/t dizziness developing with blurred vision. BP checked while EOB 102/54. Pt returned supine and symptoms resolved. Pt reapplied [place] d/t SOB and windedness despite resting sats at 98%. RPE 8/10. Pt left in bed with needs in reach. RN notified.    Kathee Delton Mobility Specialist 02/02/21, 11:48 AM

## 2021-02-03 DIAGNOSIS — R0602 Shortness of breath: Secondary | ICD-10-CM | POA: Diagnosis not present

## 2021-02-03 MED ORDER — ENOXAPARIN SODIUM 40 MG/0.4ML IJ SOSY
40.0000 mg | PREFILLED_SYRINGE | INTRAMUSCULAR | Status: DC
  Administered 2021-02-03 – 2021-02-10 (×8): 40 mg via SUBCUTANEOUS
  Filled 2021-02-03 (×8): qty 0.4

## 2021-02-03 MED ORDER — ALBUTEROL SULFATE HFA 108 (90 BASE) MCG/ACT IN AERS
2.0000 | INHALATION_SPRAY | RESPIRATORY_TRACT | Status: DC | PRN
  Filled 2021-02-03: qty 6.7

## 2021-02-03 NOTE — Progress Notes (Signed)
Denise Watts  IEP:[id number] DOB: May 16, 1955 DOA: 01/31/2021 PCP: Margo Common, PA-C (Inactive)    Brief Narrative:  65 y.o. w/ a hx of emphysema, anxiety, dysphagia, B12 and vitamin D deficiency, right adrenal mass, tobacco abuse, and dyslipidemia who was seen in the ED with complaints of shortness of breath.  Patient had COVID symptoms with cough congestion for about 10 days and was diagnosed with COVID on multiple home tests, as well as in the ED on 01/29/2021..  Date of Positive COVID Test:  01/29/21 (last day of isolation 02/08/21)  COVID-19 specific Treatment: Remdesivir 10/3 > 10/5 Steroid 10/2 >  Consultants:  None  Code Status: FULL CODE  Antimicrobials:  Azithromycin 10/2 > Rocephin 10/2 >  DVT prophylaxis: Subcu heparin  Subjective: Patient reports that she continues to wheeze and feels more short of breath than her baseline.  She denies chest pain nausea or vomiting or abdominal pain.  She does report feeling weak in general and has a poor appetite.  Assessment & Plan:  COVID Suspect this is not a primary issue at play here -stop Remdesivir -continue steroid but not for COVID and rather for treatment of COPD -no specific therapy is indicated for COVID at this time  Acute hypoxic respiratory failure -acute COPD exacerbation Saturations currently 95-99% on 2 L nasal cannula only -physical exam most consistent with an acute bronchospastic exacerbation of COPD -continue inhaled medications -continue steroid -patient confirms she quit smoking many years ago  Anxiety and depression Continue usual BuSpar and Cymbalta  E. coli UTI v/s asymptomatic bacteruria  Multiple prior urine cultures in this patient have revealed the same organism -the patient does not presently have dysuria or other symptoms that I feel strongly suggest an active UTI -I suspect her urinary tract is simply colonized with this bacteria -monitor without specific directed antibiotic  therapy  GERD Continue usual PPI  Family Communication: No family present at time of exam Status is: Inpatient  Remains inpatient appropriate because:Inpatient level of care appropriate due to severity of illness  Dispo: The patient is from: Home              Anticipated d/c is to: Home              Patient currently is not medically stable to d/c.   Difficult to place patient No         Objective: Blood pressure 130/77, pulse 80, temperature 98.1 F (36.7 C), temperature source Oral, resp. rate 20, height 5\' 1"  (1.549 m), weight 62 kg, SpO2 95 %.  Intake/Output Summary (Last 24 hours) at 02/03/2021 1017 Last data filed at 02/03/2021 0400 Gross per 24 hour  Intake 300 ml  Output --  Net 300 ml   Filed Weights   01/31/21 1903  Weight: 62 kg    Examination: General: No acute respiratory distress Lungs: Diffuse expiratory wheezing with no focal crackles with good air movement otherwise Cardiovascular: Regular rate and rhythm without murmur gallop or rub normal S1 and S2 Abdomen: Nontender, nondistended, soft, bowel sounds positive, no rebound, no ascites, no appreciable mass Extremities: No significant cyanosis, clubbing, or edema bilateral lower extremities  CBC: Recent Labs  Lab 01/31/21 1910 02/01/21 0447  WBC 11.8* 10.6*  NEUTROABS 10.6*  --   HGB 13.7 13.9  HCT 40.8 41.4  MCV 96.0 96.5  PLT 210 660   Basic Metabolic Panel: Recent Labs  Lab 01/31/21 1910 02/01/21 0447  NA 138 141  K 3.9 4.2  CL 104 108  CO2 25 28  GLUCOSE 161* 141*  BUN 13 10  CREATININE 0.74 0.77  CALCIUM 9.0 8.9   GFR: Estimated Creatinine Clearance: 57.6 mL/min (by C-G formula based on SCr of 0.77 mg/dL).  Liver Function Tests: Recent Labs  Lab 01/31/21 1910 02/01/21 0447  AST 24 22  ALT 17 18  ALKPHOS 81 78  BILITOT 0.7 0.6  PROT 7.3 7.0  ALBUMIN 3.7 3.5   Recent Labs  Lab 01/31/21 1910  LIPASE 24     Recent Results (from the past 240 hour(s))  SARS  CORONAVIRUS 2 (TAT 6-24 HRS) Nasopharyngeal Nasopharyngeal Swab     Status: Abnormal   Collection Time: 01/29/21  6:05 PM   Specimen: Nasopharyngeal Swab  Result Value Ref Range Status   SARS Coronavirus 2 POSITIVE (A) NEGATIVE Final    Comment: (NOTE) SARS-CoV-2 target nucleic acids are DETECTED.  The SARS-CoV-2 RNA is generally detectable in upper and lower respiratory specimens during the acute phase of infection. Positive results are indicative of the presence of SARS-CoV-2 RNA. Clinical correlation with patient history and other diagnostic information is  necessary to determine patient infection status. Positive results do not rule out bacterial infection or co-infection with other viruses.  The expected result is Negative.  Fact Sheet for Patients: SugarRoll.be  Fact Sheet for Healthcare Providers: https://www.woods-mathews.com/  This test is not yet approved or cleared by the [place] FDA and  has been authorized for detection and/or diagnosis of SARS-CoV-2 by FDA under an Emergency Use Authorization (EUA). This EUA will remain  in effect (meaning this test can be used) for the duration of the COVID-19 declaration under Section 564(b)(1) of the Act, 21 U. S.C. section 360bbb-3(b)(1), unless the authorization is terminated or revoked sooner.   Performed at [facility], [place] [address], [place], [place] [address]   Urine Culture     Status: Abnormal   Collection Time: 01/31/21 11:41 PM   Specimen: Urine, Random  Result Value Ref Range Status   Specimen Description   Final    URINE, RANDOM Performed at [facility], [address] [place], [place] [address]    Special Requests   Final    NONE Performed at [facility], [place], [place] [address]    Culture >=100,000 COLONIES/mL ESCHERICHIA COLI (A)  Final   Report Status 02/02/2021 FINAL  Final   Organism ID, Bacteria  ESCHERICHIA COLI (A)  Final      Susceptibility   Escherichia coli - MIC*    AMPICILLIN <=2 SENSITIVE Sensitive     CEFAZOLIN <=4 SENSITIVE Sensitive     CEFEPIME <=0.12 SENSITIVE Sensitive     CEFTRIAXONE <=0.25 SENSITIVE Sensitive     CIPROFLOXACIN <=0.25 SENSITIVE Sensitive     GENTAMICIN <=1 SENSITIVE Sensitive     IMIPENEM <=0.25 SENSITIVE Sensitive     NITROFURANTOIN <=16 SENSITIVE Sensitive     TRIMETH/SULFA <=20 SENSITIVE Sensitive     AMPICILLIN/SULBACTAM <=2 SENSITIVE Sensitive     PIP/TAZO <=4 SENSITIVE Sensitive     * >=100,000 COLONIES/mL ESCHERICHIA COLI  Blood culture (routine x 2)     Status: None (Preliminary result)   Collection Time: 02/01/21 12:56 AM   Specimen: BLOOD  Result Value Ref Range Status   Specimen Description BLOOD LEFT ANTECUBITAL  Final   Special Requests   Final    BOTTLES DRAWN AEROBIC AND ANAEROBIC Blood Culture adequate volume   Culture   Final  NO GROWTH 2 DAYS Performed at [facility], [address] [place], [place] [address]    Report Status PENDING  Incomplete  Culture, blood (Routine X 2) w Reflex to ID Panel     Status: None (Preliminary result)   Collection Time: 02/01/21  1:42 AM   Specimen: BLOOD  Result Value Ref Range Status   Specimen Description BLOOD LEFT FOREARM  Final   Special Requests   Final    BOTTLES DRAWN AEROBIC AND ANAEROBIC Blood Culture adequate volume   Culture   Final    NO GROWTH 2 DAYS Performed at [facility], [address] [place], [place] [address]    Report Status PENDING  Incomplete     Scheduled Meds:  aspirin EC  81 mg Oral Daily   atorvastatin  40 mg Oral Daily   busPIRone  5 mg Oral BID   DULoxetine  60 mg Oral Daily   fluticasone  1 spray Each Nare QHS   gabapentin  300 mg Oral TID   guaiFENesin  600 mg Oral BID   heparin  5,000 Units Subcutaneous Q8H   methylPREDNISolone (SOLU-MEDROL) injection  40 mg Intravenous Q12H   mometasone-formoterol  2 puff Inhalation  BID   And   umeclidinium bromide  1 puff Inhalation Daily   oxybutynin  15 mg Oral Daily   pantoprazole  40 mg Oral Daily   rOPINIRole  0.25 mg Oral QHS   sodium chloride flush  3 mL Intravenous Q12H   Continuous Infusions:  sodium chloride     cefTRIAXone (ROCEPHIN)  IV 1 g (02/02/21 0126)   remdesivir 100 mg in NS 100 mL 100 mg (02/03/21 0935)     LOS: 2 days   [name], MD [facility] Office  [phone] Pager - Text Page per Amion  If 7PM-7AM, please contact night-coverage per Amion 02/03/2021, 10:17 AM

## 2021-02-03 NOTE — Plan of Care (Signed)

## 2021-02-04 ENCOUNTER — Encounter: Payer: Medicare HMO | Admitting: Physical Therapy

## 2021-02-04 DIAGNOSIS — R0602 Shortness of breath: Secondary | ICD-10-CM | POA: Diagnosis not present

## 2021-02-04 LAB — GLUCOSE, CAPILLARY
Glucose-Capillary: 188 mg/dL — ABNORMAL HIGH (ref 70–99)
Glucose-Capillary: 250 mg/dL — ABNORMAL HIGH (ref 70–99)

## 2021-02-04 MED ORDER — PREDNISONE 50 MG PO TABS
60.0000 mg | ORAL_TABLET | Freq: Two times a day (BID) | ORAL | Status: DC
  Administered 2021-02-04 – 2021-02-05 (×2): 60 mg via ORAL
  Filled 2021-02-04 (×2): qty 1

## 2021-02-04 MED ORDER — IPRATROPIUM-ALBUTEROL 20-100 MCG/ACT IN AERS
1.0000 | INHALATION_SPRAY | Freq: Four times a day (QID) | RESPIRATORY_TRACT | Status: DC
  Administered 2021-02-04 – 2021-02-11 (×24): 1 via RESPIRATORY_TRACT
  Filled 2021-02-04: qty 4

## 2021-02-04 MED ORDER — BUDESONIDE 0.25 MG/2ML IN SUSP: 0.2500 mg | Freq: Two times a day (BID) | RESPIRATORY_TRACT | Status: DC

## 2021-02-04 MED ORDER — IPRATROPIUM-ALBUTEROL 0.5-2.5 (3) MG/3ML IN SOLN: 3.0000 mL | Freq: Four times a day (QID) | RESPIRATORY_TRACT | Status: DC

## 2021-02-04 NOTE — Progress Notes (Signed)
Denise Watts  PZW:[id number] DOB: July 24, 1955 DOA: 01/31/2021 PCP: Margo Common, PA-C (Inactive)    Brief Narrative:  65 y.o. w/ a hx of emphysema, anxiety, dysphagia, B12 and vitamin D deficiencies, right adrenal mass, tobacco abuse, and dyslipidemia who was seen in the ED with complaints of shortness of breath.  Patient had COVID symptoms with cough congestion for about 10 days and was diagnosed with COVID on multiple home tests, as well as in the ED on 01/29/2021..  Date of Positive COVID Test:  01/29/21 (last day of isolation 02/08/21)  COVID-19 specific Treatment: Remdesivir 10/3 > 10/5 Steroid 10/2 >  Consultants:  None  Code Status: FULL CODE  Antimicrobials:  Azithromycin 10/2 > 10/4 Rocephin 10/2 > 10/5  DVT prophylaxis: Lovenox  Subjective: Saturations 98-99% on 2 L nasal cannula support.  Continues to wheeze with poor air movement in all fields on physical exam.  States she still feels short of breath.  Denies chest pain nausea vomiting or abdominal pain.  Reports good appetite.  Assessment & Plan:  Acute hypoxic respiratory failure -acute bronchospastic COPD exacerbation physical exam most consistent with an acute bronchospastic exacerbation of COPD - continue inhaled medications -continue steroid - no evidence of pneumonia on CT chest -patient reported to me 10/5 that she discontinued smoking many years ago and  COVID Suspect this is not the primary issue at play here -stopped Remdesivir -continue steroid but not for COVID and rather for treatment of COPD -no specific therapy is indicated for COVID at this time -no parenchymal disease noted on CT scan chest  Anxiety and depression Continue usual BuSpar and Cymbalta  E. coli UTI v/s asymptomatic bacteruria  Multiple prior urine cultures in this patient have revealed the same organism -the patient does not presently have dysuria or other symptoms that I feel strongly suggest an active UTI -I suspect her urinary  tract is simply colonized with this bacteria -monitor without specific directed antibiotic therapy  GERD Continue usual PPI  Family Communication: Spoke with daughter at bedside at length Status is: Inpatient  Remains inpatient appropriate because:Inpatient level of care appropriate due to severity of illness  Dispo: The patient is from: Home              Anticipated d/c is to: Home              Patient currently is not medically stable to d/c.   Difficult to place patient No   Objective: Blood pressure 136/84, pulse 82, temperature 97.6 F (36.4 C), temperature source Oral, resp. rate 18, height 5\' 1"  (1.549 m), weight 62 kg, SpO2 99 %.  Intake/Output Summary (Last 24 hours) at 02/04/2021 0758 Last data filed at 02/03/2021 1300 Gross per 24 hour  Intake 960 ml  Output 1850 ml  Net -890 ml    Filed Weights   01/31/21 1903  Weight: 62 kg    Examination: General: No acute respiratory distress but not at baseline Lungs: Poor air movement in all fields with mild expiratory wheezing with no focal crackles Cardiovascular: Regular rate and rhythm without murmur  Abdomen: Nontender, nondistended, soft, bowel sounds positive, no rebound, no ascites Extremities: No significant cyanosis, clubbing, or edema bilateral lower extremities  CBC: Recent Labs  Lab 01/31/21 1910 02/01/21 0447  WBC 11.8* 10.6*  NEUTROABS 10.6*  --   HGB 13.7 13.9  HCT 40.8 41.4  MCV 96.0 96.5  PLT 210 423    Basic Metabolic Panel: Recent Labs  Lab 01/31/21  1910 02/01/21 0447  NA 138 141  K 3.9 4.2  CL 104 108  CO2 25 28  GLUCOSE 161* 141*  BUN 13 10  CREATININE 0.74 0.77  CALCIUM 9.0 8.9    GFR: Estimated Creatinine Clearance: 57.6 mL/min (by C-G formula based on SCr of 0.77 mg/dL).  Liver Function Tests: Recent Labs  Lab 01/31/21 1910 02/01/21 0447  AST 24 22  ALT 17 18  ALKPHOS 81 78  BILITOT 0.7 0.6  PROT 7.3 7.0  ALBUMIN 3.7 3.5    Recent Labs  Lab 01/31/21 1910   LIPASE 24      Recent Results (from the past 240 hour(s))  SARS CORONAVIRUS 2 (TAT 6-24 HRS) Nasopharyngeal Nasopharyngeal Swab     Status: Abnormal   Collection Time: 01/29/21  6:05 PM   Specimen: Nasopharyngeal Swab  Result Value Ref Range Status   SARS Coronavirus 2 POSITIVE (A) NEGATIVE Final    Comment: (NOTE) SARS-CoV-2 target nucleic acids are DETECTED.  The SARS-CoV-2 RNA is generally detectable in upper and lower respiratory specimens during the acute phase of infection. Positive results are indicative of the presence of SARS-CoV-2 RNA. Clinical correlation with patient history and other diagnostic information is  necessary to determine patient infection status. Positive results do not rule out bacterial infection or co-infection with other viruses.  The expected result is Negative.  Fact Sheet for Patients: SugarRoll.be  Fact Sheet for Healthcare Providers: https://www.woods-mathews.com/  This test is not yet approved or cleared by the [place] FDA and  has been authorized for detection and/or diagnosis of SARS-CoV-2 by FDA under an Emergency Use Authorization (EUA). This EUA will remain  in effect (meaning this test can be used) for the duration of the COVID-19 declaration under Section 564(b)(1) of the Act, 21 U. S.C. section 360bbb-3(b)(1), unless the authorization is terminated or revoked sooner.   Performed at [facility], [place] [address] [place], [place] [address]   Urine Culture     Status: Abnormal   Collection Time: 01/31/21 11:41 PM   Specimen: Urine, Random  Result Value Ref Range Status   Specimen Description   Final    URINE, RANDOM Performed at [facility], [address] [place], [place] [address]    Special Requests   Final    NONE Performed at [facility], [place], [place] [address]    Culture >=100,000 COLONIES/mL ESCHERICHIA COLI (A)  Final    Report Status 02/02/2021 FINAL  Final   Organism ID, Bacteria ESCHERICHIA COLI (A)  Final      Susceptibility   Escherichia coli - MIC*    AMPICILLIN <=2 SENSITIVE Sensitive     CEFAZOLIN <=4 SENSITIVE Sensitive     CEFEPIME <=0.12 SENSITIVE Sensitive     CEFTRIAXONE <=0.25 SENSITIVE Sensitive     CIPROFLOXACIN <=0.25 SENSITIVE Sensitive     GENTAMICIN <=1 SENSITIVE Sensitive     IMIPENEM <=0.25 SENSITIVE Sensitive     NITROFURANTOIN <=16 SENSITIVE Sensitive     TRIMETH/SULFA <=20 SENSITIVE Sensitive     AMPICILLIN/SULBACTAM <=2 SENSITIVE Sensitive     PIP/TAZO <=4 SENSITIVE Sensitive     * >=100,000 COLONIES/mL ESCHERICHIA COLI  Blood culture (routine x 2)     Status: None (Preliminary result)   Collection Time: 02/01/21 12:56 AM   Specimen: BLOOD  Result Value Ref Range Status   Specimen Description BLOOD LEFT ANTECUBITAL  Final   Special Requests   Final    BOTTLES DRAWN  AEROBIC AND ANAEROBIC Blood Culture adequate volume   Culture   Final    NO GROWTH 3 DAYS Performed at [facility], [address] [place], [place] [address]    Report Status PENDING  Incomplete  Culture, blood (Routine X 2) w Reflex to ID Panel     Status: None (Preliminary result)   Collection Time: 02/01/21  1:42 AM   Specimen: BLOOD  Result Value Ref Range Status   Specimen Description BLOOD LEFT FOREARM  Final   Special Requests   Final    BOTTLES DRAWN AEROBIC AND ANAEROBIC Blood Culture adequate volume   Culture   Final    NO GROWTH 3 DAYS Performed at [facility], [place], [place] [address]    Report Status PENDING  Incomplete      Scheduled Meds:  aspirin EC  81 mg Oral Daily   atorvastatin  40 mg Oral Daily   busPIRone  5 mg Oral BID   DULoxetine  60 mg Oral Daily   enoxaparin (LOVENOX) injection  40 mg Subcutaneous Q24H   fluticasone  1 spray Each Nare QHS   gabapentin  300 mg Oral TID   guaiFENesin  600 mg Oral BID   methylPREDNISolone  (SOLU-MEDROL) injection  40 mg Intravenous Q12H   mometasone-formoterol  2 puff Inhalation BID   And   umeclidinium bromide  1 puff Inhalation Daily   oxybutynin  15 mg Oral Daily   pantoprazole  40 mg Oral Daily   rOPINIRole  0.25 mg Oral QHS      LOS: 3 days   [name], MD [facility] Office  [phone] Pager - Text Page per Amion  If 7PM-7AM, please contact night-coverage per Amion 02/04/2021, 7:58 AM

## 2021-02-05 DIAGNOSIS — R0602 Shortness of breath: Secondary | ICD-10-CM | POA: Diagnosis not present

## 2021-02-05 MED ORDER — ZOLPIDEM TARTRATE 5 MG PO TABS
5.0000 mg | ORAL_TABLET | Freq: Every day | ORAL | Status: DC
  Administered 2021-02-06 – 2021-02-10 (×6): 5 mg via ORAL
  Filled 2021-02-05 (×6): qty 1

## 2021-02-05 MED ORDER — NYSTATIN 100000 UNIT/ML MT SUSP
5.0000 mL | Freq: Four times a day (QID) | OROMUCOSAL | Status: DC
  Administered 2021-02-05 – 2021-02-10 (×17): 500000 [IU] via ORAL
  Filled 2021-02-05 (×16): qty 5

## 2021-02-05 MED ORDER — CLONAZEPAM 0.25 MG PO TBDP
0.2500 mg | ORAL_TABLET | Freq: Two times a day (BID) | ORAL | Status: DC
  Administered 2021-02-05 – 2021-02-06 (×2): 0.25 mg via ORAL
  Filled 2021-02-05 (×2): qty 1

## 2021-02-05 MED ORDER — PREDNISONE 20 MG PO TABS
40.0000 mg | ORAL_TABLET | Freq: Two times a day (BID) | ORAL | Status: DC
  Administered 2021-02-05 – 2021-02-08 (×6): 40 mg via ORAL
  Filled 2021-02-05 (×6): qty 2

## 2021-02-05 MED ORDER — ALUM & MAG HYDROXIDE-SIMETH 200-200-20 MG/5ML PO SUSP
15.0000 mL | ORAL | Status: DC | PRN
  Administered 2021-02-05 – 2021-02-06 (×2): 15 mL via ORAL
  Filled 2021-02-05 (×2): qty 30

## 2021-02-05 NOTE — Care Management Important Message (Signed)
Important Message  Patient Details  Name: Denise Watts MRN: [id number] Date of Birth: [birth year]   Medicare Important Message Given:  Yes     Loann Quill 02/05/2021, 1:15 PM

## 2021-02-05 NOTE — Progress Notes (Signed)
Denise Watts  WGN:[id number] DOB: 29-Sep-1955 DOA: 01/31/2021 PCP: Margo Common, PA-C (Inactive)    Brief Narrative:  65 y.o. w/ a hx of emphysema (45 pack year hx/stopped 1997), anxiety, dysphagia, B12 and vitamin D deficiencies, right adrenal mass, tobacco abuse, and dyslipidemia who was seen in the ED with complaints of shortness of breath.  Patient had COVID symptoms with cough & congestion for about 10 days and was diagnosed with COVID on multiple home tests, as well as in the ED on 01/29/2021..  Date of Positive COVID Test:  01/29/21 (last day of isolation 02/08/21)  COVID-19 specific Treatment: Remdesivir 10/3 > 10/5 Steroid 10/2 >  Consultants:  None  Code Status: FULL CODE  Antimicrobials:  Azithromycin 10/3 > 10/4 Rocephin 10/3 > 10/5  DVT prophylaxis: Lovenox  Subjective: No new events recorded overnight.  Afebrile.  Vital signs stable.  Saturation 90-92% on room air.  Despite being able to be weaned to room air the patient herself still feels significantly short of breath.  She appears dyspneic on exam.  She denies chest pain nausea or vomiting or abdominal pain.  She feels weak in general and is unstable when attempting to ambulate.  Assessment & Plan:  Acute hypoxic respiratory failure -acute bronchospastic COPD exacerbation physical exam most consistent with an acute bronchospastic exacerbation of COPD - continue inhaled medications (which are limited by CoViD diagnosis) - continue steroid - no evidence of pneumonia on CT chest - patient reported to me 10/5 that she discontinued smoking many years ago -despite ongoing use of appropriate medications the patient's respiratory symptoms have not significantly improved -query component of vocal cord dysfunction -is followed chronically by pulmonologist therefore we will ask pulmonary to consult  COVID Suspect this is not the primary issue at play here -stopped Remdesivir -continue steroid but not for COVID and rather  for treatment of COPD -no specific therapy is indicated for COVID at this time -no parenchymal disease noted on CT scan chest  Anxiety and depression Continue usual BuSpar and Cymbalta - perhaps this is contributing to her sx as well - give trial of low dose scheduled klonopin to be used only while here   E. coli UTI v/s asymptomatic bacteruria  Multiple prior urine cultures in this patient have revealed the same organism -the patient does not presently have dysuria or other symptoms that I feel strongly suggest an active UTI -I suspect her urinary tract is simply colonized with this bacteria -monitor without specific directed antibiotic therapy  GERD Continue usual PPI  Family Communication: Spoke with oldest daughter at bedside with Status is: Inpatient  Remains inpatient appropriate because:Inpatient level of care appropriate due to severity of illness  Dispo: The patient is from: Home              Anticipated d/c is to: Home              Patient currently is not medically stable to d/c.   Difficult to place patient No   Objective: Blood pressure 106/85, pulse 84, temperature 98 F (36.7 C), resp. rate 17, height 5\' 1"  (1.549 m), weight 62 kg, SpO2 90 %.  Intake/Output Summary (Last 24 hours) at 02/05/2021 0742 Last data filed at 02/05/2021 0200 Gross per 24 hour  Intake 120 ml  Output --  Net 120 ml    Filed Weights   01/31/21 1903  Weight: 62 kg    Examination: General: Not in extremis but mildly tachypneic Lungs: Poor air movement  throughout all with no focal crackles but mild expiratory wheezing appreciable Cardiovascular: Regular rate and rhythm without murmur  Abdomen: Nontender, nondistended, soft, bowel sounds positive, no rebound, no ascites Extremities: No C/C/E B LE  CBC: Recent Labs  Lab 01/31/21 1910 02/01/21 0447  WBC 11.8* 10.6*  NEUTROABS 10.6*  --   HGB 13.7 13.9  HCT 40.8 41.4  MCV 96.0 96.5  PLT 210 053    Basic Metabolic Panel: Recent  Labs  Lab 01/31/21 1910 02/01/21 0447  NA 138 141  K 3.9 4.2  CL 104 108  CO2 25 28  GLUCOSE 161* 141*  BUN 13 10  CREATININE 0.74 0.77  CALCIUM 9.0 8.9    GFR: Estimated Creatinine Clearance: 57.6 mL/min (by C-G formula based on SCr of 0.77 mg/dL).  Liver Function Tests: Recent Labs  Lab 01/31/21 1910 02/01/21 0447  AST 24 22  ALT 17 18  ALKPHOS 81 78  BILITOT 0.7 0.6  PROT 7.3 7.0  ALBUMIN 3.7 3.5    Recent Labs  Lab 01/31/21 1910  LIPASE 24      Recent Results (from the past 240 hour(s))  SARS CORONAVIRUS 2 (TAT 6-24 HRS) Nasopharyngeal Nasopharyngeal Swab     Status: Abnormal   Collection Time: 01/29/21  6:05 PM   Specimen: Nasopharyngeal Swab  Result Value Ref Range Status   SARS Coronavirus 2 POSITIVE (A) NEGATIVE Final    Comment: (NOTE) SARS-CoV-2 target nucleic acids are DETECTED.  The SARS-CoV-2 RNA is generally detectable in upper and lower respiratory specimens during the acute phase of infection. Positive results are indicative of the presence of SARS-CoV-2 RNA. Clinical correlation with patient history and other diagnostic information is  necessary to determine patient infection status. Positive results do not rule out bacterial infection or co-infection with other viruses.  The expected result is Negative.  Fact Sheet for Patients: SugarRoll.be  Fact Sheet for Healthcare Providers: https://www.woods-mathews.com/  This test is not yet approved or cleared by the [place] FDA and  has been authorized for detection and/or diagnosis of SARS-CoV-2 by FDA under an Emergency Use Authorization (EUA). This EUA will remain  in effect (meaning this test can be used) for the duration of the COVID-19 declaration under Section 564(b)(1) of the Act, 21 U. S.C. section 360bbb-3(b)(1), unless the authorization is terminated or revoked sooner.   Performed at [facility], [address]  [place], [place] [address]   Urine Culture     Status: Abnormal   Collection Time: 01/31/21 11:41 PM   Specimen: Urine, Random  Result Value Ref Range Status   Specimen Description   Final    URINE, RANDOM Performed at [facility], [address] [place], [place] [address]    Special Requests   Final    NONE Performed at [facility], [address] [place], [place] [address]    Culture >=100,000 COLONIES/mL ESCHERICHIA COLI (A)  Final   Report Status 02/02/2021 FINAL  Final   Organism ID, Bacteria ESCHERICHIA COLI (A)  Final      Susceptibility   Escherichia coli - MIC*    AMPICILLIN <=2 SENSITIVE Sensitive     CEFAZOLIN <=4 SENSITIVE Sensitive     CEFEPIME <=0.12 SENSITIVE Sensitive     CEFTRIAXONE <=0.25 SENSITIVE Sensitive     CIPROFLOXACIN <=0.25 SENSITIVE Sensitive     GENTAMICIN <=1 SENSITIVE Sensitive     IMIPENEM <=0.25 SENSITIVE Sensitive     NITROFURANTOIN <=16 SENSITIVE Sensitive  TRIMETH/SULFA <=20 SENSITIVE Sensitive     AMPICILLIN/SULBACTAM <=2 SENSITIVE Sensitive     PIP/TAZO <=4 SENSITIVE Sensitive     * >=100,000 COLONIES/mL ESCHERICHIA COLI  Blood culture (routine x 2)     Status: None (Preliminary result)   Collection Time: 02/01/21 12:56 AM   Specimen: BLOOD  Result Value Ref Range Status   Specimen Description BLOOD LEFT ANTECUBITAL  Final   Special Requests   Final    BOTTLES DRAWN AEROBIC AND ANAEROBIC Blood Culture adequate volume   Culture   Final    NO GROWTH 4 DAYS Performed at [facility], [address] [place], [place] [address]    Report Status PENDING  Incomplete  Culture, blood (Routine X 2) w Reflex to ID Panel     Status: None (Preliminary result)   Collection Time: 02/01/21  1:42 AM   Specimen: BLOOD  Result Value Ref Range Status   Specimen Description BLOOD LEFT FOREARM  Final   Special Requests   Final    BOTTLES DRAWN AEROBIC AND ANAEROBIC Blood Culture adequate volume   Culture   Final    NO  GROWTH 4 DAYS Performed at [facility], [place], [place] [address]    Report Status PENDING  Incomplete      Scheduled Meds:  aspirin EC  81 mg Oral Daily   atorvastatin  40 mg Oral Daily   busPIRone  5 mg Oral BID   DULoxetine  60 mg Oral Daily   enoxaparin (LOVENOX) injection  40 mg Subcutaneous Q24H   fluticasone  1 spray Each Nare QHS   gabapentin  300 mg Oral TID   guaiFENesin  600 mg Oral BID   Ipratropium-Albuterol  1 puff Inhalation Q6H   oxybutynin  15 mg Oral Daily   pantoprazole  40 mg Oral Daily   predniSONE  60 mg Oral BID WC   rOPINIRole  0.25 mg Oral QHS      LOS: 4 days   [name], MD [facility] Office  [phone] Pager - Text Page per Amion  If 7PM-7AM, please contact night-coverage per Amion 02/05/2021, 7:42 AM

## 2021-02-05 NOTE — Care Management Important Message (Signed)
Important Message  Patient Details  Name: Denise Watts MRN: [id number] Date of Birth: [birth year]   Medicare Important Message Given:  Yes     Loann Quill 02/05/2021, 1:15 PM

## 2021-02-06 DIAGNOSIS — R0602 Shortness of breath: Secondary | ICD-10-CM | POA: Diagnosis not present

## 2021-02-06 LAB — PROCALCITONIN: Procalcitonin: <0.1 ng/mL

## 2021-02-06 LAB — CULTURE, BLOOD (ROUTINE X 2)
Culture: NO GROWTH
Culture: NO GROWTH
Special Requests: ADEQUATE
Special Requests: ADEQUATE

## 2021-02-06 LAB — MRSA NEXT GEN BY PCR, NASAL: MRSA by PCR Next Gen: NOT DETECTED

## 2021-02-06 LAB — FERRITIN: Ferritin: 61 ng/mL (ref 11–307)

## 2021-02-06 LAB — SEDIMENTATION RATE: Sed Rate: 10 mm/hr (ref 0–30)

## 2021-02-06 MED ORDER — PANTOPRAZOLE SODIUM 40 MG IV SOLR
40.0000 mg | Freq: Two times a day (BID) | INTRAVENOUS | Status: DC
  Administered 2021-02-06 – 2021-02-08 (×6): 40 mg via INTRAVENOUS
  Filled 2021-02-06 (×5): qty 40

## 2021-02-06 MED ORDER — ACETYLCYSTEINE 20 % IN SOLN: 4.0000 mL | Freq: Two times a day (BID) | RESPIRATORY_TRACT | Status: DC

## 2021-02-06 NOTE — Plan of Care (Signed)
Patient is improving with plan of care.

## 2021-02-06 NOTE — Consult Note (Addendum)
Pulmonary Medicine          Date: 02/06/2021,   MRN# [id number] RIA REDCAY [birth year]     AdmissionWeight: 62 kg                 CurrentWeight: 86 kg   Referring physician: Dr Thereasa Solo   CHIEF COMPLAINT:   Persistent dyspnea and SOB at rest   HISTORY OF PRESENT ILLNESS    65yo F whx of laryngeal CA s/p radiation therapy with residual VCD, COPD/emphysema, severe GERD with hiatal hernia, hx of Breast CA s/p lumpectomy and radiation, MDD, anxiety disorder OA came in with complaints of cough and dyspnea and was +for COVID 01/27/21 noted to be hypoxemic in ER on admission with mild leukocytosis. She had CTPE done which I reviewed independently, findings include bronchitic changes bilaterally with LLL platelike atelectasis worse posteriorly as well as generous PA diameter with GL:OVFIE index >1 suggestive of PH. Pictorial documentation included in imaging section below.  Patient able to take IS tidal volume to 750cc non labored but coughs with deep inspiration. She reports feeling inspissated phelgm in chest but too weak to expectorate debris. PCCM consult for further eval and mgt.    PAST MEDICAL HISTORY   Past Medical History:  Diagnosis Date  . Anxiety   . Arthritis    KNEE RIGHT  . Breast cancer [facility]) 1997   right breast lumpectomy with radiation  . Breast cancer ([place]) 11/15/2017   INVASIVE MAMMARY CARCINOMA/ triple negative  . 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[place]) 11/10/2014  . Cough 03/02/2017   INTERMTTENT DRY COUGH  . Depression   . GERD (gastroesophageal reflux disease)   . History of hiatal hernia   . Personal history of chemotherapy   . Personal history of radiation therapy   . Skin cancer   . Throat cancer ([place]) 1998   radiation     SURGICAL HISTORY   Past Surgical History:  Procedure Laterality Date  . BREAST BIOPSY Right 1997   positive  . BREAST BIOPSY Right 11/15/2017   INVASIVE MAMMARY CARCINOMA triple negative  . BREAST BIOPSY Right 07/20/2020    [place] bx, Q marker, path pending  . BREAST LUMPECTOMY Right 1997   2019 also  . BREAST LUMPECTOMY WITH SENTINEL LYMPH NODE BIOPSY Right 12/08/2017   Procedure: BREAST LUMPECTOMY WITH SENTINEL LYMPH NODE BX;  Surgeon: Robert Bellow, MD;  Location: [facility] ORS;  Service: General;  Laterality: Right;  . BREAST REDUCTION WITH MASTOPEXY Left 06/10/2019   Procedure: Left breast mastopexy reduction for symmetry;  Surgeon: Wallace Going, DO;  Location: [place];  Service: Plastics;  Laterality: Left;  total 2.5 hours  . COLONOSCOPY  2015  . COLONOSCOPY WITH PROPOFOL N/A 10/29/2019   Procedure: COLONOSCOPY WITH PROPOFOL;  Surgeon: Lucilla Lame, MD;  Location: [facility] ENDOSCOPY;  Service: Endoscopy;  Laterality: N/A;  . ESOPHAGOGASTRODUODENOSCOPY (EGD) WITH PROPOFOL N/A 05/24/2019   Procedure: ESOPHAGOGASTRODUODENOSCOPY (EGD) WITH PROPOFOL;  Surgeon: Lucilla Lame, MD;  Location: [facility] ENDOSCOPY;  Service: Endoscopy;  Laterality: N/A;  . ESOPHAGUS SURGERY    . KNEE SURGERY Right   . LIPOSUCTION WITH LIPOFILLING Right 06/10/2019   Procedure: right breast scar and capsule release with fat grafting;  Surgeon: Wallace Going, DO;  Location: [place];  Service: Plastics;  Laterality: Right;  . PORT-A-CATH REMOVAL    . PORTACATH PLACEMENT Right 01/17/2018   Procedure: INSERTION PORT-A-CATH- RIGHT;  Surgeon: Robert Bellow, MD;  Location: [facility]  ORS;  Service: General;  Laterality: Right;  . ROBOTIC ADRENALECTOMY Right 12/28/2016   Procedure: ROBOTIC ADRENALECTOMY;  Surgeon: Hollice Espy, MD;  Location: [facility] ORS;  Service: Urology;  Laterality: Right;  . SKIN CANCER EXCISION    . THROAT SURGERY  1998   throat cancer   . TUBAL LIGATION    . VENTRAL HERNIA REPAIR N/A 03/03/2017   Procedure: HERNIA REPAIR VENTRAL ADULT;  Surgeon: Clayburn Pert, MD;  Location: [facility] ORS;  Service: General;  Laterality: N/A;     FAMILY HISTORY   Family History  Problem Relation Age of  Onset  . Diabetes Mother   . Heart attack Mother   . Cancer Sister        cancer of eyes, spread to lung, other places. died at 28  . Cancer Sister 66       unknown primary, spread to bone, brain died in 67's  . Cancer Paternal Aunt        unk type  . Cancer Paternal Grandmother        type unk  . Prostate cancer Neg Hx   . Kidney cancer Neg Hx   . Bladder Cancer Neg Hx      SOCIAL HISTORY   Social History   Tobacco Use  . Smoking status: Former    Packs/day: 1.50    Years: 30.00    Pack years: 45.00    Types: Cigarettes    Quit date: 05/03/1995    Years since quitting: 25.7  . Smokeless tobacco: Never  Vaping Use  . Vaping Use: Never used  Substance Use Topics  . Alcohol use: Yes    Alcohol/week: 4.0 standard drinks    Types: 4 Shots of liquor per week    Comment: 2 per night on weekends  . Drug use: No     MEDICATIONS    Home Medication:    Current Medication:  Current Facility-Administered Medications:  .  albuterol (VENTOLIN HFA) 108 (90 Base) MCG/ACT inhaler 2 puff, 2 puff, Inhalation, Q4H PRN, [name], MD .  alum & mag hydroxide-simeth (MAALOX/MYLANTA) 200-200-20 MG/5ML suspension 15 mL, 15 mL, Oral, Q4H PRN, [name], MD, 15 mL at 02/06/21 0612 .  aspirin EC tablet 81 mg, 81 mg, Oral, Daily, Para Skeans, MD, 81 mg at 02/06/21 3329 .  atorvastatin (LIPITOR) tablet 40 mg, 40 mg, Oral, Daily, Para Skeans, MD, 40 mg at 02/06/21 0832 .  benzonatate (TESSALON) capsule 100-200 mg, 100-200 mg, Oral, TID PRN, Para Skeans, MD, 200 mg at 02/05/21 1808 .  busPIRone (BUSPAR) tablet 5 mg, 5 mg, Oral, BID, Para Skeans, MD, 5 mg at 02/06/21 5188 .  clonazePAM (KLONOPIN) disintegrating tablet 0.25 mg, 0.25 mg, Oral, BID, [name], MD, 0.25 mg at 02/06/21 4166 .  cyclobenzaprine (FLEXERIL) tablet 5 mg, 5 mg, Oral, TID PRN, Nolberto Hanlon, MD, 5 mg at 02/05/21 2103 .  DULoxetine (CYMBALTA) DR capsule 60 mg, 60 mg, Oral, Daily, Para Skeans, MD, 60 mg at 02/06/21 0832 .  enoxaparin (LOVENOX) injection 40 mg, 40 mg, Subcutaneous, Q24H, [name], MD, 40 mg at 02/05/21 2103 .  fluticasone (FLONASE) 50 MCG/ACT nasal spray 1 spray, 1 spray, Each Nare, QHS, Para Skeans, MD, 1 spray at 02/06/21 0034 .  gabapentin (NEURONTIN) capsule 300 mg, 300 mg, Oral, TID, Para Skeans, MD, 300 mg at 02/06/21 0832 .  guaiFENesin-dextromethorphan (ROBITUSSIN DM) 100-10 MG/5ML syrup 5-10 mL, 5-10 mL, Oral,  Q4H PRN, Nolberto Hanlon, MD, 10 mL at 02/05/21 1316 .  HYDROcodone-acetaminophen (NORCO/VICODIN) 5-325 MG per tablet 1 tablet, 1 tablet, Oral, Q4H PRN, Para Skeans, MD, 1 tablet at 02/05/21 1942 .  Ipratropium-Albuterol (COMBIVENT) respimat 1 puff, 1 puff, Inhalation, Q6H, [name], MD, 1 puff at 02/06/21 0948 .  nystatin (MYCOSTATIN) 100000 UNIT/ML suspension 500,000 Units, 5 mL, Oral, QID, Sharion Settler, NP, 500,000 Units at 02/06/21 [phone] .  oxybutynin (DITROPAN-XL) 24 hr tablet 15 mg, 15 mg, Oral, Daily, Florina Ou V, MD, 15 mg at 02/06/21 0831 .  pantoprazole (PROTONIX) EC tablet 40 mg, 40 mg, Oral, Daily, Nolberto Hanlon, MD, 40 mg at 02/06/21 0833 .  predniSONE (DELTASONE) tablet 40 mg, 40 mg, Oral, BID WC, [name], MD, 40 mg at 02/06/21 2542 .  rOPINIRole (REQUIP) tablet 0.25 mg, 0.25 mg, Oral, QHS, Amery, Sahar, MD, 0.25 mg at 02/06/21 0033 .  zolpidem (AMBIEN) tablet 5 mg, 5 mg, Oral, QHS, [name], MD, 5 mg at 02/06/21 0033  Facility-Administered Medications Ordered in Other Encounters:  .  cyanocobalamin ((VITAMIN B-12)) injection 1,000 mcg, 1,000 mcg, Intramuscular, Q30 days, Sindy Guadeloupe, MD, 1,000 mcg at 09/28/18 1013 .  cyanocobalamin ((VITAMIN B-12)) injection 1,000 mcg, 1,000 mcg, Intramuscular, Q30 days, Sindy Guadeloupe, MD, 1,000 mcg at 11/30/18 1355 .  cyanocobalamin ((VITAMIN B-12)) injection 1,000 mcg, 1,000 mcg, Intramuscular, Q30 days, Sindy Guadeloupe, MD, 1,000 mcg at 08/16/19  1144    ALLERGIES   Sulfa antibiotics     REVIEW OF SYSTEMS    Review of Systems:  Gen:  Denies  fever, sweats, chills weigh loss  HEENT: Denies blurred vision, double vision, ear pain, eye pain, hearing loss, nose bleeds, sore throat Cardiac:  No dizziness, chest pain or heaviness, chest tightness,edema Resp:   Denies cough or sputum porduction, shortness of breath,wheezing, hemoptysis,  Gi: Denies swallowing difficulty, stomach pain, nausea or vomiting, diarrhea, constipation, bowel incontinence Gu:  Denies bladder incontinence, burning urine Ext:   Denies Joint pain, stiffness or swelling Skin: Denies  skin rash, easy bruising or bleeding or hives Endoc:  Denies polyuria, polydipsia , polyphagia or weight change Psych:   Denies depression, insomnia or hallucinations   Other:  All other systems negative   VS: BP 108/67 (BP Location: Left Arm)   Pulse 95   Temp 97.8 F (36.6 C) (Oral)   Resp 16   Ht 5' 1" (1.549 m)   Wt 62 kg   SpO2 95%   BMI 25.83 kg/m      PHYSICAL EXAM    GENERAL:NAD, no fevers, chills, no weakness no fatigue HEAD: Normocephalic, atraumatic.  EYES: Pupils equal, round, reactive to light. Extraocular muscles intact. No scleral icterus.  MOUTH: Moist mucosal membrane. Dentition intact. No abscess noted.  EAR, NOSE, THROAT: Clear without exudates. No external lesions.  NECK: Supple. No thyromegaly. No nodules. No JVD.  PULMONARY: Diffuse coarse rhonchi right sided +wheezes CARDIOVASCULAR: S1 and S2. Regular rate and rhythm. No murmurs, rubs, or gallops. No edema. Pedal pulses 2+ bilaterally.  GASTROINTESTINAL: Soft, nontender, nondistended. No masses. Positive bowel sounds. No hepatosplenomegaly.  MUSCULOSKELETAL: No swelling, clubbing, or edema. Range of motion full in all extremities.  NEUROLOGIC: Cranial nerves II through XII are intact. No gross focal neurological deficits. Sensation intact. Reflexes intact.  SKIN: No ulceration, lesions,  rashes, or cyanosis. Skin warm and dry. Turgor intact.  PSYCHIATRIC: Mood, affect within normal limits. The patient is awake, alert and oriented x  3. Insight, judgment intact.       IMAGING    DG Chest 2 View  Result Date: 01/29/2021 CLINICAL DATA:  Cough, body aches, COVID-19 positive EXAM: CHEST - 2 VIEW COMPARISON:  06/08/2020 CT 10/31/2018 FINDINGS: Frontal and lateral views of the chest demonstrate an unremarkable cardiac silhouette. Stable 1.5 cm left lower lobe pulmonary nodule. The remaining small nodule seen on recent chest CT are not readily apparent by x-ray. No acute airspace disease, effusion, or pneumothorax. Minimal scarring at the left lung base. No acute bony abnormality. IMPRESSION: 1. No acute intrathoracic process. 2. Stable 1.5 cm left lower lobe pulmonary nodule, unchanged since 2020. The remaining pulmonary nodule seen on the recent chest CT 08/21/2020 are not readily apparent by x-ray. Electronically Signed   By: Randa Ngo M.D.   On: 01/29/2021 16:47   CT Angio Chest PE W and/or Wo Contrast  Result Date: 01/31/2021 CLINICAL DATA:  Ongoing symptoms of COVID. EXAM: CT ANGIOGRAPHY CHEST WITH CONTRAST TECHNIQUE: Multidetector CT imaging of the chest was performed using the standard protocol during bolus administration of intravenous contrast. Multiplanar CT image reconstructions and MIPs were obtained to evaluate the vascular anatomy. CONTRAST:  49m OMNIPAQUE IOHEXOL 350 MG/ML SOLN COMPARISON:  August 21, 2020 FINDINGS: Cardiovascular: There is moderate severity calcification of the aortic arch, without evidence of aortic aneurysm or dissection. Satisfactory opacification of the pulmonary arteries to the segmental level. No evidence of pulmonary embolism. Normal heart size. No pericardial effusion. Mediastinum/Nodes: No enlarged mediastinal, hilar, or axillary lymph nodes. Thyroid gland, trachea, and esophagus demonstrate no significant findings. Lungs/Pleura: Mild atelectasis  is seen within the posterior aspects of the bilateral lung left greater than right. There is no evidence of acute infiltrate, pleural effusion or pneumothorax. Upper Abdomen: There is a small hiatal hernia. A 1.3 cm diameter simple cyst is seen along the anterior aspect of the upper pole of the left kidney. Musculoskeletal: A 4.9 cm x 2.3 cm lipoma is seen along the anterolateral aspect of the lower right chest wall. No acute osseous abnormalities are identified. Review of the MIP images confirms the above findings. IMPRESSION: 1. No CT evidence of pulmonary embolism or acute cardiopulmonary disease. 2. Small hiatal hernia. 3. 4.9 cm x 2.3 cm lower right chest wall lipoma. 4. Aortic atherosclerosis. Aortic Atherosclerosis (ICD10-I70.0). Electronically Signed   By: TVirgina NorfolkM.D.   On: 01/31/2021 20:23    Media Information Document Information  Photos    02/06/2021 13:27  Attached To:  Hospital Encounter on 01/31/21   Source Information  AOttie Glazier MD  [facility] (1c)    ASSESSMENT/PLAN   Chronic COPD with chronic bronchitis -She is on pred 40 bid which should help with inflammatory changes -will swab for non covid RVP virus to see if other infections are overlying -[facility] -will ask if mucomyst could be delivered by RT but unsure if this can be done while on covid precautions   Atelectasis of LLL Continue use of IS - currently at 750cc/h -mucinex   GERD with hiatal hernia  Review of CT chest to me looks like there is diverticulum , will order barium swallow to elucidate anatomic lesion and volume of regurgitant flow.    COVID19 infection    - can check cycle threshold value with lab to see if its active if >35 then inactive old infection     - CRP/ferritin/ESR/MRSA pcr/ Procalcitonin  -continue current mgt     Thank you for allowing me  to participate in the care of this patient.  Total face to face encounter time for this patient  visit was >41mn. >50% of the time was  spent in counseling and coordination of care.   Patient/Family are satisfied with care plan and all questions have been answered.  This document was prepared using Dragon voice recognition software and may include unintentional dictation errors.     FOttie Glazier M.D.  [facility] of [facility] [place]

## 2021-02-06 NOTE — Progress Notes (Signed)
Denise Watts  WYO:[id number] DOB: [birth year] DOA: 01/31/2021 PCP: Margo Common, PA-C (Inactive)    Brief Narrative:  65 y.o. w/ a hx of emphysema (45 pack year hx/stopped 1997), anxiety, dysphagia, B12 and vitamin D deficiencies, right adrenal mass, tobacco abuse, and dyslipidemia who was seen in the ED with complaints of shortness of breath.  Patient had COVID symptoms with cough & congestion for about 10 days and was diagnosed with COVID on multiple home tests, as well as in the ED on 01/29/2021..  Date of Positive COVID Test:  01/29/21 (last day of isolation 02/08/21)  COVID-19 specific Treatment: Remdesivir 10/3 > 10/5 Steroid 10/2 >  Consultants:  None  Code Status: FULL CODE  Antimicrobials:  Azithromycin 10/3 > 10/4 Rocephin 10/3 > 10/5  DVT prophylaxis: Lovenox  Subjective: No new events recorded overnight.  Low-dose Klonopin started yesterday.  Saturations remain in the mid to upper 90s on room air.  Vitals stable.  Continues to report significant dyspnea.  This is limiting her ability to get up and get moving.  She states she feels very weak in general and becomes very short of breath with the slightest of exertion.  She denies chest pain.  She reports good intake.  Assessment & Plan:  Acute hypoxic respiratory failure -acute bronchospastic COPD exacerbation physical exam most consistent with an acute bronchospastic exacerbation of COPD - continue inhaled medications (which are limited by CoViD diagnosis) - continue steroid - no evidence of pneumonia on CT chest - patient reported to me 10/5 that she discontinued smoking many years ago -despite ongoing use of appropriate medications the patient's respiratory symptoms have been slow to improve -query component of vocal cord dysfunction -low-dose Klonopin added 10/7 w/o improvement - I have consulted Pulmonary to assist   COVID Suspect this is not the primary issue at play here -stopped Remdesivir -continue steroid but  not for COVID and rather for treatment of COPD -no specific therapy is indicated for COVID at this time -no parenchymal disease noted on CT scan chest  Anxiety and depression Continue usual BuSpar and Cymbalta - perhaps this is contributing to her sx as well   E. coli UTI v/s asymptomatic bacteruria  Multiple prior urine cultures in this patient have revealed the same organism -the patient does not presently have dysuria or other symptoms that I feel strongly suggest an active UTI -I suspect her urinary tract is simply colonized with this bacteria -monitor without specific directed antibiotic therapy  GERD Continue usual PPI  Family Communication: No family present at time of exam today Status is: Inpatient  Remains inpatient appropriate because:Inpatient level of care appropriate due to severity of illness  Dispo: The patient is from: Home              Anticipated d/c is to: Home              Patient currently is not medically stable to d/c.   Difficult to place patient No   Objective: Blood pressure 130/75, pulse 82, temperature 98.5 F (36.9 C), resp. rate 18, height 5\' 1"  (1.549 m), weight 62 kg, SpO2 97 %.  Intake/Output Summary (Last 24 hours) at 02/06/2021 0743 Last data filed at 02/06/2021 0600 Gross per 24 hour  Intake 400 ml  Output --  Net 400 ml    Filed Weights   01/31/21 1903  Weight: 62 kg    Examination: General: Not in extremis -mild tachypnea persist Lungs: Distant breath sounds with poor air  movement in all fields, mild expiratory wheezing, no focal crackles Cardiovascular: Regular rate and rhythm without murmur  Abdomen: Nontender, nondistended, soft, bowel sounds positive, no rebound, no ascites Extremities: No C/C/E B lower extremities  CBC: Recent Labs  Lab 01/31/21 1910 02/01/21 0447  WBC 11.8* 10.6*  NEUTROABS 10.6*  --   HGB 13.7 13.9  HCT 40.8 41.4  MCV 96.0 96.5  PLT 210 540    Basic Metabolic Panel: Recent Labs  Lab  01/31/21 1910 02/01/21 0447  NA 138 141  K 3.9 4.2  CL 104 108  CO2 25 28  GLUCOSE 161* 141*  BUN 13 10  CREATININE 0.74 0.77  CALCIUM 9.0 8.9    GFR: Estimated Creatinine Clearance: 57.6 mL/min (by C-G formula based on SCr of 0.77 mg/dL).  Liver Function Tests: Recent Labs  Lab 01/31/21 1910 02/01/21 0447  AST 24 22  ALT 17 18  ALKPHOS 81 78  BILITOT 0.7 0.6  PROT 7.3 7.0  ALBUMIN 3.7 3.5    Recent Labs  Lab 01/31/21 1910  LIPASE 24      Recent Results (from the past 240 hour(s))  SARS CORONAVIRUS 2 (TAT 6-24 HRS) Nasopharyngeal Nasopharyngeal Swab     Status: Abnormal   Collection Time: 01/29/21  6:05 PM   Specimen: Nasopharyngeal Swab  Result Value Ref Range Status   SARS Coronavirus 2 POSITIVE (A) NEGATIVE Final    Comment: (NOTE) SARS-CoV-2 target nucleic acids are DETECTED.  The SARS-CoV-2 RNA is generally detectable in upper and lower respiratory specimens during the acute phase of infection. Positive results are indicative of the presence of SARS-CoV-2 RNA. Clinical correlation with patient history and other diagnostic information is  necessary to determine patient infection status. Positive results do not rule out bacterial infection or co-infection with other viruses.  The expected result is Negative.  Fact Sheet for Patients: SugarRoll.be  Fact Sheet for Healthcare Providers: https://www.woods-mathews.com/  This test is not yet approved or cleared by the [place] FDA and  has been authorized for detection and/or diagnosis of SARS-CoV-2 by FDA under an Emergency Use Authorization (EUA). This EUA will remain  in effect (meaning this test can be used) for the duration of the COVID-19 declaration under Section 564(b)(1) of the Act, 21 U. S.C. section 360bbb-3(b)(1), unless the authorization is terminated or revoked sooner.   Performed at [facility], [address], [place],  [place] [address]   Urine Culture     Status: Abnormal   Collection Time: 01/31/21 11:41 PM   Specimen: Urine, Random  Result Value Ref Range Status   Specimen Description   Final    URINE, RANDOM Performed at [facility], [address] [place], [place] [address]    Special Requests   Final    NONE Performed at [facility], [address] [place], [place] [address]    Culture >=100,000 COLONIES/mL ESCHERICHIA COLI (A)  Final   Report Status 02/02/2021 FINAL  Final   Organism ID, Bacteria ESCHERICHIA COLI (A)  Final      Susceptibility   Escherichia coli - MIC*    AMPICILLIN <=2 SENSITIVE Sensitive     CEFAZOLIN <=4 SENSITIVE Sensitive     CEFEPIME <=0.12 SENSITIVE Sensitive     CEFTRIAXONE <=0.25 SENSITIVE Sensitive     CIPROFLOXACIN <=0.25 SENSITIVE Sensitive     GENTAMICIN <=1 SENSITIVE Sensitive     IMIPENEM <=0.25 SENSITIVE Sensitive     NITROFURANTOIN <=16 SENSITIVE Sensitive  TRIMETH/SULFA <=20 SENSITIVE Sensitive     AMPICILLIN/SULBACTAM <=2 SENSITIVE Sensitive     PIP/TAZO <=4 SENSITIVE Sensitive     * >=100,000 COLONIES/mL ESCHERICHIA COLI  Blood culture (routine x 2)     Status: None   Collection Time: 02/01/21 12:56 AM   Specimen: BLOOD  Result Value Ref Range Status   Specimen Description BLOOD LEFT ANTECUBITAL  Final   Special Requests   Final    BOTTLES DRAWN AEROBIC AND ANAEROBIC Blood Culture adequate volume   Culture   Final    NO GROWTH 5 DAYS Performed at [facility], [address] [place], [place] [address]    Report Status 02/06/2021 FINAL  Final  Culture, blood (Routine X 2) w Reflex to ID Panel     Status: None   Collection Time: 02/01/21  1:42 AM   Specimen: BLOOD  Result Value Ref Range Status   Specimen Description BLOOD LEFT FOREARM  Final   Special Requests   Final    BOTTLES DRAWN AEROBIC AND ANAEROBIC Blood Culture adequate volume   Culture   Final    NO GROWTH 5 DAYS Performed at [facility],  [address] [place], [place] [address]    Report Status 02/06/2021 FINAL  Final      Scheduled Meds:  aspirin EC  81 mg Oral Daily   atorvastatin  40 mg Oral Daily   busPIRone  5 mg Oral BID   clonazePAM  0.25 mg Oral BID   DULoxetine  60 mg Oral Daily   enoxaparin (LOVENOX) injection  40 mg Subcutaneous Q24H   fluticasone  1 spray Each Nare QHS   gabapentin  300 mg Oral TID   guaiFENesin  600 mg Oral BID   Ipratropium-Albuterol  1 puff Inhalation Q6H   nystatin  5 mL Oral QID   oxybutynin  15 mg Oral Daily   pantoprazole  40 mg Oral Daily   predniSONE  40 mg Oral BID WC   rOPINIRole  0.25 mg Oral QHS   zolpidem  5 mg Oral QHS      LOS: 5 days   [name], MD [facility] Office  [phone] Pager - Text Page per Amion  If 7PM-7AM, please contact night-coverage per Amion 02/06/2021, 7:43 AM

## 2021-02-07 DIAGNOSIS — R0602 Shortness of breath: Secondary | ICD-10-CM | POA: Diagnosis not present

## 2021-02-07 LAB — BASIC METABOLIC PANEL
Anion gap: 10 (ref 5–15)
BUN: 19 mg/dL (ref 8–23)
CO2: 29 mmol/L (ref 22–32)
Calcium: 8.9 mg/dL (ref 8.9–10.3)
Chloride: 100 mmol/L (ref 98–111)
Creatinine, Ser: 0.87 mg/dL (ref 0.44–1.00)
GFR, Estimated: >60 mL/min (ref >60)
Glucose, Bld: 165 mg/dL — ABNORMAL HIGH (ref 70–99)
Potassium: 4.6 mmol/L (ref 3.5–5.1)
Sodium: 139 mmol/L (ref 135–145)

## 2021-02-07 LAB — RESPIRATORY PANEL BY PCR

## 2021-02-07 LAB — CBC
HCT: 40 % (ref 36.0–46.0)
Hemoglobin: 13.2 g/dL (ref 12.0–15.0)
MCH: 32.4 pg (ref 26.0–34.0)
MCHC: 33 g/dL (ref 30.0–36.0)
MCV: 98 fL (ref 80.0–100.0)
Platelets: 213 10*3/uL (ref 150–400)
RBC: 4.08 MIL/uL (ref 3.87–5.11)
RDW: 12.9 % (ref 11.5–15.5)
WBC: 9.6 10*3/uL (ref 4.0–10.5)
nRBC: 0 % (ref 0.0–0.2)

## 2021-02-07 LAB — GLUCOSE, CAPILLARY
Glucose-Capillary: 148 mg/dL — ABNORMAL HIGH (ref 70–99)
Glucose-Capillary: 187 mg/dL — ABNORMAL HIGH (ref 70–99)

## 2021-02-07 LAB — VITAMIN B12: Vitamin B-12: 565 pg/mL (ref 180–914)

## 2021-02-07 LAB — C-REACTIVE PROTEIN: CRP: 1 mg/dL — ABNORMAL HIGH (ref <1.0)

## 2021-02-07 MED ORDER — ALUM & MAG HYDROXIDE-SIMETH 200-200-20 MG/5ML PO SUSP: 30.0000 mL | Freq: Once | ORAL | Status: DC

## 2021-02-07 MED ORDER — ALUM & MAG HYDROXIDE-SIMETH 200-200-20 MG/5ML PO SUSP
30.0000 mL | Freq: Once | ORAL | Status: AC
  Administered 2021-02-07: 15:00:00 30 mL via ORAL
  Filled 2021-02-07: qty 30

## 2021-02-07 MED ORDER — INSULIN ASPART 100 UNIT/ML IJ SOLN
0.0000 [IU] | Freq: Three times a day (TID) | INTRAMUSCULAR | Status: DC
  Administered 2021-02-07: 2 [IU] via SUBCUTANEOUS
  Administered 2021-02-07 – 2021-02-08 (×3): 1 [IU] via SUBCUTANEOUS
  Administered 2021-02-08: 12:00:00 3 [IU] via SUBCUTANEOUS
  Administered 2021-02-09: 2 [IU] via SUBCUTANEOUS
  Administered 2021-02-09: 1 [IU] via SUBCUTANEOUS
  Administered 2021-02-10: 2 [IU] via SUBCUTANEOUS
  Administered 2021-02-10: 18:00:00 5 [IU] via SUBCUTANEOUS
  Administered 2021-02-11: 2 [IU] via SUBCUTANEOUS
  Filled 2021-02-07 (×8): qty 1

## 2021-02-07 MED ORDER — PHENOL 1.4 % MT LIQD
2.0000 | OROMUCOSAL | Status: AC
Start: 2021-02-07 — End: 2021-02-07
  Administered 2021-02-07: 2 via OROMUCOSAL
  Filled 2021-02-07: qty 177

## 2021-02-07 MED ORDER — LIDOCAINE VISCOUS HCL 2 % MT SOLN: 15.0000 mL | Freq: Once | OROMUCOSAL | Status: DC

## 2021-02-07 MED ORDER — LIDOCAINE VISCOUS HCL 2 % MT SOLN
15.0000 mL | OROMUCOSAL | Status: DC | PRN
  Administered 2021-02-08: 15 mL via ORAL
  Filled 2021-02-07 (×2): qty 15

## 2021-02-07 NOTE — Progress Notes (Signed)
Pulmonology         Date: 02/07/2021,   MRN# [id number] Denise Watts Jul 15, 1955     AdmissionWeight: 62 kg                 CurrentWeight: 18 kg   Referring physician: Dr Thereasa Solo   CHIEF COMPLAINT:   Persistent dyspnea and SOB at rest   HISTORY OF PRESENT ILLNESS AND SUBJECTIVE FINDINGS    65yo F whx of laryngeal CA s/p radiation therapy with residual VCD, COPD/emphysema, severe GERD with hiatal hernia, hx of Breast CA s/p lumpectomy and radiation, MDD, anxiety disorder OA came in with complaints of cough and dyspnea and was +for COVID 01/27/21 noted to be hypoxemic in ER on admission with mild leukocytosis. She had CTPE done which I reviewed independently, findings include bronchitic changes bilaterally with LLL platelike atelectasis worse posteriorly as well as generous PA diameter with LK:GMWNU index >1 suggestive of PH. Pictorial documentation included in imaging section below.  Patient able to take IS tidal volume to 750cc non labored but coughs with deep inspiration. She reports feeling inspissated phelgm in chest but too weak to expectorate debris. PCCM consult for further eval and mgt.   02/07/21- Patient has not had RVP done yet.  She was unable to get barium swallow done due to only being available on weekdays and this is scheduled for Monday.  Husband was at bedside we reviewed medical plan. She continues to use IS and flutter valve for BPH.  Lab work with CRP borderline elevated. Phenol spray and viscous lidocaine added for throat discomfort due to Larygopharyngeal reflux.   PAST MEDICAL HISTORY   Past Medical History:  Diagnosis Date  . Anxiety   . Arthritis    KNEE RIGHT  . Breast cancer [facility]) 1997   right breast lumpectomy with radiation  . Breast cancer ([place]) 11/15/2017   INVASIVE MAMMARY CARCINOMA/ triple negative  . 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 Cough 03/02/2017   INTERMTTENT DRY COUGH  . Depression   . GERD (gastroesophageal reflux  disease)   . History of hiatal hernia   . Personal history of chemotherapy   . Personal history of radiation therapy   . Skin cancer   . Throat cancer ([place]) 1998   radiation     SURGICAL HISTORY   Past Surgical History:  Procedure Laterality Date  . BREAST BIOPSY Right 1997   positive  . BREAST BIOPSY Right 11/15/2017   INVASIVE MAMMARY CARCINOMA triple negative  . BREAST BIOPSY Right 07/20/2020   [place] bx, Q marker, path pending  . BREAST LUMPECTOMY Right 1997   2019 also  . BREAST LUMPECTOMY WITH SENTINEL LYMPH NODE BIOPSY Right 12/08/2017   Procedure: BREAST LUMPECTOMY WITH SENTINEL LYMPH NODE BX;  Surgeon: Robert Bellow, MD;  Location: [facility] ORS;  Service: General;  Laterality: Right;  . BREAST REDUCTION WITH MASTOPEXY Left 06/10/2019   Procedure: Left breast mastopexy reduction for symmetry;  Surgeon: Wallace Going, DO;  Location: [place];  Service: Plastics;  Laterality: Left;  total 2.5 hours  . COLONOSCOPY  2015  . COLONOSCOPY WITH PROPOFOL N/A 10/29/2019   Procedure: COLONOSCOPY WITH PROPOFOL;  Surgeon: Lucilla Lame, MD;  Location: [facility] ENDOSCOPY;  Service: Endoscopy;  Laterality: N/A;  . ESOPHAGOGASTRODUODENOSCOPY (EGD) WITH PROPOFOL N/A 05/24/2019   Procedure: ESOPHAGOGASTRODUODENOSCOPY (EGD) WITH PROPOFOL;  Surgeon: Lucilla Lame, MD;  Location: [facility] ENDOSCOPY;  Service: Endoscopy;  Laterality: N/A;  . ESOPHAGUS SURGERY    .  KNEE SURGERY Right   . LIPOSUCTION WITH LIPOFILLING Right 06/10/2019   Procedure: right breast scar and capsule release with fat grafting;  Surgeon: Wallace Going, DO;  Location: [place];  Service: Plastics;  Laterality: Right;  . PORT-A-CATH REMOVAL    . PORTACATH PLACEMENT Right 01/17/2018   Procedure: INSERTION PORT-A-CATH- RIGHT;  Surgeon: Robert Bellow, MD;  Location: [facility] ORS;  Service: General;  Laterality: Right;  . ROBOTIC ADRENALECTOMY Right 12/28/2016   Procedure: ROBOTIC ADRENALECTOMY;   Surgeon: Hollice Espy, MD;  Location: [facility] ORS;  Service: Urology;  Laterality: Right;  . SKIN CANCER EXCISION    . THROAT SURGERY  1998   throat cancer   . TUBAL LIGATION    . VENTRAL HERNIA REPAIR N/A 03/03/2017   Procedure: HERNIA REPAIR VENTRAL ADULT;  Surgeon: Clayburn Pert, MD;  Location: [facility] ORS;  Service: General;  Laterality: N/A;     FAMILY HISTORY   Family History  Problem Relation Age of Onset  . Diabetes Mother   . Heart attack Mother   . Cancer Sister        cancer of eyes, spread to lung, other places. died at 82  . Cancer Sister 91       unknown primary, spread to bone, brain died in 11's  . Cancer Paternal Aunt        unk type  . Cancer Paternal Grandmother        type unk  . Prostate cancer Neg Hx   . Kidney cancer Neg Hx   . Bladder Cancer Neg Hx      SOCIAL HISTORY   Social History   Tobacco Use  . Smoking status: Former    Packs/day: 1.50    Years: 30.00    Pack years: 45.00    Types: Cigarettes    Quit date: 05/03/1995    Years since quitting: 25.7  . Smokeless tobacco: Never  Vaping Use  . Vaping Use: Never used  Substance Use Topics  . Alcohol use: Yes    Alcohol/week: 4.0 standard drinks    Types: 4 Shots of liquor per week    Comment: 2 per night on weekends  . Drug use: No     MEDICATIONS    Home Medication:    Current Medication:  Current Facility-Administered Medications:  .  albuterol (VENTOLIN HFA) 108 (90 Base) MCG/ACT inhaler 2 puff, 2 puff, Inhalation, Q4H PRN, [name], MD .  alum & mag hydroxide-simeth (MAALOX/MYLANTA) 200-200-20 MG/5ML suspension 15 mL, 15 mL, Oral, Q4H PRN, [name], MD, 15 mL at 02/06/21 0612 .  aspirin EC tablet 81 mg, 81 mg, Oral, Daily, Florina Ou V, MD, 81 mg at 02/07/21 0910 .  atorvastatin (LIPITOR) tablet 40 mg, 40 mg, Oral, Daily, Florina Ou V, MD, 40 mg at 02/07/21 0910 .  busPIRone (BUSPAR) tablet 5 mg, 5 mg, Oral, BID, Para Skeans, MD, 5 mg at 02/07/21  0910 .  cyclobenzaprine (FLEXERIL) tablet 5 mg, 5 mg, Oral, TID PRN, Nolberto Hanlon, MD, 5 mg at 02/05/21 2103 .  DULoxetine (CYMBALTA) DR capsule 60 mg, 60 mg, Oral, Daily, Florina Ou V, MD, 60 mg at 02/07/21 0910 .  enoxaparin (LOVENOX) injection 40 mg, 40 mg, Subcutaneous, Q24H, [name], MD, 40 mg at 02/06/21 2113 .  fluticasone (FLONASE) 50 MCG/ACT nasal spray 1 spray, 1 spray, Each Nare, QHS, Para Skeans, MD, 1 spray at 02/06/21 2112 .  gabapentin (NEURONTIN) capsule 300 mg,  300 mg, Oral, TID, Florina Ou V, MD, 300 mg at 02/07/21 0910 .  guaiFENesin-dextromethorphan (ROBITUSSIN DM) 100-10 MG/5ML syrup 5-10 mL, 5-10 mL, Oral, Q4H PRN, Nolberto Hanlon, MD, 10 mL at 02/06/21 2110 .  HYDROcodone-acetaminophen (NORCO/VICODIN) 5-325 MG per tablet 1 tablet, 1 tablet, Oral, Q4H PRN, Para Skeans, MD, 1 tablet at 02/06/21 2111 .  insulin aspart (novoLOG) injection 0-9 Units, 0-9 Units, Subcutaneous, TID WC, McClung, Kimberlee Nearing, MD .  Ipratropium-Albuterol (COMBIVENT) respimat 1 puff, 1 puff, Inhalation, Q6H, [name], MD, 1 puff at 02/07/21 0910 .  nystatin (MYCOSTATIN) 100000 UNIT/ML suspension 500,000 Units, 5 mL, Oral, QID, Sharion Settler, NP, 500,000 Units at 02/07/21 0911 .  oxybutynin (DITROPAN-XL) 24 hr tablet 15 mg, 15 mg, Oral, Daily, Florina Ou V, MD, 15 mg at 02/07/21 0910 .  pantoprazole (PROTONIX) injection 40 mg, 40 mg, Intravenous, Q12H, Ottie Glazier, MD, 40 mg at 02/07/21 0910 .  predniSONE (DELTASONE) tablet 40 mg, 40 mg, Oral, BID WC, [name], MD, 40 mg at 02/07/21 0910 .  rOPINIRole (REQUIP) tablet 0.25 mg, 0.25 mg, Oral, QHS, Amery, Sahar, MD, 0.25 mg at 02/06/21 2111 .  zolpidem (AMBIEN) tablet 5 mg, 5 mg, Oral, QHS, [name], MD, 5 mg at 02/06/21 2111  Facility-Administered Medications Ordered in Other Encounters:  .  cyanocobalamin ((VITAMIN B-12)) injection 1,000 mcg, 1,000 mcg, Intramuscular, Q30 days, Sindy Guadeloupe, MD, 1,000 mcg  at 09/28/18 1013 .  cyanocobalamin ((VITAMIN B-12)) injection 1,000 mcg, 1,000 mcg, Intramuscular, Q30 days, Sindy Guadeloupe, MD, 1,000 mcg at 11/30/18 1355 .  cyanocobalamin ((VITAMIN B-12)) injection 1,000 mcg, 1,000 mcg, Intramuscular, Q30 days, Sindy Guadeloupe, MD, 1,000 mcg at 08/16/19 1144    ALLERGIES   Sulfa antibiotics     REVIEW OF SYSTEMS    Review of Systems:  Gen:  Denies  fever, sweats, chills weigh loss  HEENT: Denies blurred vision, double vision, ear pain, eye pain, hearing loss, nose bleeds, sore throat Cardiac:  No dizziness, chest pain or heaviness, chest tightness,edema Resp:   Denies cough or sputum porduction, shortness of breath,wheezing, hemoptysis,  Gi: Denies swallowing difficulty, stomach pain, nausea or vomiting, diarrhea, constipation, bowel incontinence Gu:  Denies bladder incontinence, burning urine Ext:   Denies Joint pain, stiffness or swelling Skin: Denies  skin rash, easy bruising or bleeding or hives Endoc:  Denies polyuria, polydipsia , polyphagia or weight change Psych:   Denies depression, insomnia or hallucinations   Other:  All other systems negative   VS: BP (!) 182/100 (BP Location: Left Leg)   Pulse 87   Temp (!) 97.5 F (36.4 C)   Resp 17   Ht 5' 1"  (1.549 m)   Wt 62 kg   SpO2 100%   BMI 25.83 kg/m      PHYSICAL EXAM    GENERAL:NAD, no fevers, chills, no weakness no fatigue HEAD: Normocephalic, atraumatic.  EYES: Pupils equal, round, reactive to light. Extraocular muscles intact. No scleral icterus.  MOUTH: Moist mucosal membrane. Dentition intact. No abscess noted.  EAR, NOSE, THROAT: Clear without exudates. No external lesions.  NECK: Supple. No thyromegaly. No nodules. No JVD.  PULMONARY: Diffuse coarse rhonchi right sided +wheezes CARDIOVASCULAR: S1 and S2. Regular rate and rhythm. No murmurs, rubs, or gallops. No edema. Pedal pulses 2+ bilaterally.  GASTROINTESTINAL: Soft, nontender, nondistended. No masses.  Positive bowel sounds. No hepatosplenomegaly.  MUSCULOSKELETAL: No swelling, clubbing, or edema. Range of motion full in all extremities.  NEUROLOGIC: Cranial nerves  II through XII are intact. No gross focal neurological deficits. Sensation intact. Reflexes intact.  SKIN: No ulceration, lesions, rashes, or cyanosis. Skin warm and dry. Turgor intact.  PSYCHIATRIC: Mood, affect within normal limits. The patient is awake, alert and oriented x 3. Insight, judgment intact.       IMAGING    DG Chest 2 View  Result Date: 01/29/2021 CLINICAL DATA:  Cough, body aches, COVID-19 positive EXAM: CHEST - 2 VIEW COMPARISON:  06/08/2020 CT 10/31/2018 FINDINGS: Frontal and lateral views of the chest demonstrate an unremarkable cardiac silhouette. Stable 1.5 cm left lower lobe pulmonary nodule. The remaining small nodule seen on recent chest CT are not readily apparent by x-ray. No acute airspace disease, effusion, or pneumothorax. Minimal scarring at the left lung base. No acute bony abnormality. IMPRESSION: 1. No acute intrathoracic process. 2. Stable 1.5 cm left lower lobe pulmonary nodule, unchanged since 2020. The remaining pulmonary nodule seen on the recent chest CT 08/21/2020 are not readily apparent by x-ray. Electronically Signed   By: Randa Ngo M.D.   On: 01/29/2021 16:47   CT Angio Chest PE W and/or Wo Contrast  Result Date: 01/31/2021 CLINICAL DATA:  Ongoing symptoms of COVID. EXAM: CT ANGIOGRAPHY CHEST WITH CONTRAST TECHNIQUE: Multidetector CT imaging of the chest was performed using the standard protocol during bolus administration of intravenous contrast. Multiplanar CT image reconstructions and MIPs were obtained to evaluate the vascular anatomy. CONTRAST:  53m OMNIPAQUE IOHEXOL 350 MG/ML SOLN COMPARISON:  August 21, 2020 FINDINGS: Cardiovascular: There is moderate severity calcification of the aortic arch, without evidence of aortic aneurysm or dissection. Satisfactory opacification of the  pulmonary arteries to the segmental level. No evidence of pulmonary embolism. Normal heart size. No pericardial effusion. Mediastinum/Nodes: No enlarged mediastinal, hilar, or axillary lymph nodes. Thyroid gland, trachea, and esophagus demonstrate no significant findings. Lungs/Pleura: Mild atelectasis is seen within the posterior aspects of the bilateral lung left greater than right. There is no evidence of acute infiltrate, pleural effusion or pneumothorax. Upper Abdomen: There is a small hiatal hernia. A 1.3 cm diameter simple cyst is seen along the anterior aspect of the upper pole of the left kidney. Musculoskeletal: A 4.9 cm x 2.3 cm lipoma is seen along the anterolateral aspect of the lower right chest wall. No acute osseous abnormalities are identified. Review of the MIP images confirms the above findings. IMPRESSION: 1. No CT evidence of pulmonary embolism or acute cardiopulmonary disease. 2. Small hiatal hernia. 3. 4.9 cm x 2.3 cm lower right chest wall lipoma. 4. Aortic atherosclerosis. Aortic Atherosclerosis (ICD10-I70.0). Electronically Signed   By: TVirgina NorfolkM.D.   On: 01/31/2021 20:23    Media Information Document Information  Photos    02/06/2021 13:27  Attached To:  Hospital Encounter on 01/31/21   Source Information  AOttie Glazier MD  [facility] (1c)    ASSESSMENT/PLAN   Chronic COPD with chronic bronchitis -She is on pred 40 bid which should help with inflammatory changes -will swab for non covid RVP virus to see if other infections are overlying -[facility] -will ask if mucomyst could be delivered by RT but unsure if this can be done while on covid precautions   Atelectasis of LLL Continue use of IS - currently at 750cc/h -mucinex   GERD with hiatal hernia  Review of CT chest to me looks like there is diverticulum , will order barium swallow to elucidate anatomic lesion and volume of regurgitant flow.  -Phenol spray QID and  viscous lidocaine   COVID19 infection    - can check cycle threshold value with lab to see if its active if >35 then inactive old infection     - CRP/ferritin/ESR/MRSA pcr/ Procalcitonin  -continue current mgt     Thank you for allowing me to participate in the care of this patient.  Total face to face encounter time for this patient visit was >24mn. >50% of the time was  spent in counseling and coordination of care.   Patient/Family are satisfied with care plan and all questions have been answered.  This document was prepared using Dragon voice recognition software and may include unintentional dictation errors.     FOttie Glazier M.D.  [place]

## 2021-02-07 NOTE — Progress Notes (Signed)
Denise Watts  IZT:[id number] DOB: October 02, 1955 DOA: 01/31/2021 PCP: Margo Common, PA-C (Inactive)    Brief Narrative:  [phone] w/ a hx of laryngeal cancer status post radiation therapy 1998 with residual VCD, emphysema (45 pack year hx/stopped 1997), breast cancer status post lumpectomy and XRT 2019, anxiety, dysphagia, B12 and vitamin D deficiencies, right adrenal mass, tobacco abuse, and dyslipidemia who was seen in the ED with complaints of shortness of breath.  Patient had COVID symptoms with cough & congestion for about 10 days and was diagnosed with COVID on multiple home tests, as well as in the ED on 01/29/2021..  Date of Positive COVID Test:  01/29/21 (last day of isolation 02/08/21)  COVID-19 specific Treatment: Remdesivir 10/3 > 10/5 Steroid 10/2 >  Consultants:  Pulmonary  Code Status: FULL CODE  Antimicrobials:  Azithromycin 10/3 > 10/4 Rocephin 10/3 > 10/5  DVT prophylaxis: Lovenox  Subjective: Afebrile.  Blood pressure trending up this morning.  Saturations stable at 100% on room air.  Pulmonary saw the patient in consultation yesterday.  Patient does not feel that her breathing is any better today, though she also does not feel that it is any worse.  Assessment & Plan:  Acute hypoxic respiratory failure -acute bronchospastic COPD exacerbation physical exam most consistent with an acute bronchospastic exacerbation of COPD - continue inhaled medications (which are limited by CoViD diagnosis) - continue steroid - no evidence of pneumonia on CT chest and procalcitonin <0.1 - patient reported to me 10/5 that she discontinued smoking many years ago -despite ongoing use of appropriate medications the patient's respiratory symptoms have been slow to improve -query component of vocal cord dysfunction -low-dose Klonopin added 10/7 w/o improvement -Pulmonary now following  COVID Suspect this is not the primary issue at play here -stopped Remdesivir -continue steroid but not for  COVID and rather for treatment of COPD -no specific therapy is indicated for COVID at this time -no parenchymal disease noted on CT scan chest -inflammatory markers not suggestive of an acute phase of this infection  Anxiety and depression Continue usual BuSpar and Cymbalta - perhaps this is contributing to her sx as well   History of B12 deficiency Review of records notes patient has a history of a severe B12 deficiency -recheck levels today to assure this is not contributing to her symptoms  Hyperglycemia No prior history of DM -likely simply due to steroids -check A1c  E. coli UTI v/s asymptomatic bacteruria  Multiple prior urine cultures in this patient have revealed the same organism -the patient does not presently have dysuria or other symptoms that I feel strongly suggest an active UTI -I suspect her urinary tract is simply colonized with this bacteria -monitor without specific directed antibiotic therapy  GERD Continue usual PPI -Pulmonary suspect possible esophageal diverticulum -barium swallow pending  Family Communication: No family present at time of exam today Status is: Inpatient  Remains inpatient appropriate because:Inpatient level of care appropriate due to severity of illness  Dispo: The patient is from: Home              Anticipated d/c is to: Home              Patient currently is not medically stable to d/c.   Difficult to place patient No   Objective: Blood pressure (!) 182/100, pulse 87, temperature (!) 97.5 F (36.4 C), resp. rate 17, height 5\' 1"  (1.549 m), weight 62 kg, SpO2 100 %.  Intake/Output Summary (Last 24 hours) at  02/07/2021 0824 Last data filed at 02/06/2021 1626 Gross per 24 hour  Intake 480 ml  Output --  Net 480 ml    Filed Weights   01/31/21 1903  Weight: 62 kg    Examination: General: Not in extremis -mild tachypnea without change Lungs: Distant breath sounds with poor air movement in all fields, mild expiratory wheezing, no focal  crackles - w/o change  Cardiovascular: Regular rate and rhythm without murmur  Abdomen: Nontender, nondistended, soft, bowel sounds positive, no rebound, no ascites Extremities: No C/C/E B lower extremities  CBC: Recent Labs  Lab 01/31/21 1910 02/01/21 0447 02/07/21 0401  WBC 11.8* 10.6* 9.6  NEUTROABS 10.6*  --   --   HGB 13.7 13.9 13.2  HCT 40.8 41.4 40.0  MCV 96.0 96.5 98.0  PLT 210 185 502    Basic Metabolic Panel: Recent Labs  Lab 01/31/21 1910 02/01/21 0447 02/07/21 0401  NA 138 141 139  K 3.9 4.2 4.6  CL 104 108 100  CO2 25 28 29   GLUCOSE 161* 141* 165*  BUN 13 10 19   CREATININE 0.74 0.77 0.87  CALCIUM 9.0 8.9 8.9    GFR: Estimated Creatinine Clearance: 53 mL/min (by C-G formula based on SCr of 0.87 mg/dL).  Liver Function Tests: Recent Labs  Lab 01/31/21 1910 02/01/21 0447  AST 24 22  ALT 17 18  ALKPHOS 81 78  BILITOT 0.7 0.6  PROT 7.3 7.0  ALBUMIN 3.7 3.5    Recent Labs  Lab 01/31/21 1910  LIPASE 24      Recent Results (from the past 240 hour(s))  SARS CORONAVIRUS 2 (TAT 6-24 HRS) Nasopharyngeal Nasopharyngeal Swab     Status: Abnormal   Collection Time: 01/29/21  6:05 PM   Specimen: Nasopharyngeal Swab  Result Value Ref Range Status   SARS Coronavirus 2 POSITIVE (A) NEGATIVE Final    Comment: (NOTE) SARS-CoV-2 target nucleic acids are DETECTED.  The SARS-CoV-2 RNA is generally detectable in upper and lower respiratory specimens during the acute phase of infection. Positive results are indicative of the presence of SARS-CoV-2 RNA. Clinical correlation with patient history and other diagnostic information is  necessary to determine patient infection status. Positive results do not rule out bacterial infection or co-infection with other viruses.  The expected result is Negative.  Fact Sheet for Patients: SugarRoll.be  Fact Sheet for Healthcare  Providers: https://www.woods-mathews.com/  This test is not yet approved or cleared by the [place] FDA and  has been authorized for detection and/or diagnosis of SARS-CoV-2 by FDA under an Emergency Use Authorization (EUA). This EUA will remain  in effect (meaning this test can be used) for the duration of the COVID-19 declaration under Section 564(b)(1) of the Act, 21 U. S.C. section 360bbb-3(b)(1), unless the authorization is terminated or revoked sooner.   Performed at [facility], [place] [address] [place], [place] [address]   Urine Culture     Status: Abnormal   Collection Time: 01/31/21 11:41 PM   Specimen: Urine, Random  Result Value Ref Range Status   Specimen Description   Final    URINE, RANDOM Performed at [facility], [address] [place], [place] [address]    Special Requests   Final    NONE Performed at [facility], [place] [place],  [address]    Culture >=100,000 COLONIES/mL ESCHERICHIA COLI (A)  Final   Report Status 02/02/2021 FINAL  Final   Organism ID, Bacteria ESCHERICHIA COLI (A)  Final      Susceptibility   Escherichia coli - MIC*    AMPICILLIN <=2 SENSITIVE Sensitive     CEFAZOLIN <=4 SENSITIVE Sensitive     CEFEPIME <=0.12 SENSITIVE Sensitive     CEFTRIAXONE <=0.25 SENSITIVE Sensitive     CIPROFLOXACIN <=0.25 SENSITIVE Sensitive     GENTAMICIN <=1 SENSITIVE Sensitive     IMIPENEM <=0.25 SENSITIVE Sensitive     NITROFURANTOIN <=16 SENSITIVE Sensitive     TRIMETH/SULFA <=20 SENSITIVE Sensitive     AMPICILLIN/SULBACTAM <=2 SENSITIVE Sensitive     PIP/TAZO <=4 SENSITIVE Sensitive     * >=100,000 COLONIES/mL ESCHERICHIA COLI  Blood culture (routine x 2)     Status: None   Collection Time: 02/01/21 12:56 AM   Specimen: BLOOD  Result Value Ref Range Status   Specimen Description BLOOD LEFT ANTECUBITAL  Final   Special Requests   Final    BOTTLES DRAWN AEROBIC AND ANAEROBIC Blood Culture  adequate volume   Culture   Final    NO GROWTH 5 DAYS Performed at [facility], [address] [place], [place] [address]    Report Status 02/06/2021 FINAL  Final  Culture, blood (Routine X 2) w Reflex to ID Panel     Status: None   Collection Time: 02/01/21  1:42 AM   Specimen: BLOOD  Result Value Ref Range Status   Specimen Description BLOOD LEFT FOREARM  Final   Special Requests   Final    BOTTLES DRAWN AEROBIC AND ANAEROBIC Blood Culture adequate volume   Culture   Final    NO GROWTH 5 DAYS Performed at [facility], [address], [place], [place] [address]    Report Status 02/06/2021 FINAL  Final  MRSA Next Gen by PCR, Nasal     Status: None   Collection Time: 02/06/21  2:50 PM   Specimen: Nasal Mucosa; Nasal Swab  Result Value Ref Range Status   MRSA by PCR Next Gen NOT DETECTED NOT DETECTED Final    Comment: (NOTE) The GeneXpert MRSA Assay (FDA approved for NASAL specimens only), is one component of a comprehensive MRSA colonization surveillance program. It is not intended to diagnose MRSA infection nor to guide or monitor treatment for MRSA infections. Test performance is not FDA approved in patients less than 76 years old. Performed at [facility], [address] [place], [place] [address]       Scheduled Meds:  aspirin EC  81 mg Oral Daily   atorvastatin  40 mg Oral Daily   busPIRone  5 mg Oral BID   DULoxetine  60 mg Oral Daily   enoxaparin (LOVENOX) injection  40 mg Subcutaneous Q24H   fluticasone  1 spray Each Nare QHS   gabapentin  300 mg Oral TID   Ipratropium-Albuterol  1 puff Inhalation Q6H   nystatin  5 mL Oral QID   oxybutynin  15 mg Oral Daily   pantoprazole (PROTONIX) IV  40 mg Intravenous Q12H   predniSONE  40 mg Oral BID WC   rOPINIRole  0.25 mg Oral QHS   zolpidem  5 mg Oral QHS      LOS: 6 days   [name], MD [facility] Office  [phone] Pager - Text Page per Shea Evans  If  7PM-7AM, please contact night-coverage per Amion 02/07/2021, 8:24 AM

## 2021-02-08 ENCOUNTER — Inpatient Hospital Stay: Payer: Medicare HMO

## 2021-02-08 ENCOUNTER — Ambulatory Visit: Payer: Medicare HMO | Admitting: Physical Therapy

## 2021-02-08 ENCOUNTER — Ambulatory Visit: Payer: Medicare HMO | Admitting: Pulmonary Disease

## 2021-02-08 DIAGNOSIS — R0602 Shortness of breath: Secondary | ICD-10-CM | POA: Diagnosis not present

## 2021-02-08 LAB — HEMOGLOBIN A1C
Hgb A1c MFr Bld: 6.4 % — ABNORMAL HIGH (ref 4.8–5.6)
Mean Plasma Glucose: 137 mg/dL

## 2021-02-08 LAB — GLUCOSE, CAPILLARY
Glucose-Capillary: 123 mg/dL — ABNORMAL HIGH (ref 70–99)
Glucose-Capillary: 249 mg/dL — ABNORMAL HIGH (ref 70–99)
Glucose-Capillary: 131 mg/dL — ABNORMAL HIGH (ref 70–99)

## 2021-02-08 MED ORDER — ADULT MULTIVITAMIN W/MINERALS CH
1.0000 | ORAL_TABLET | Freq: Every day | ORAL | Status: DC
  Administered 2021-02-09 – 2021-02-11 (×3): 1 via ORAL
  Filled 2021-02-08 (×4): qty 1

## 2021-02-08 MED ORDER — NEPRO/CARBSTEADY PO LIQD
237.0000 mL | Freq: Three times a day (TID) | ORAL | Status: DC
  Administered 2021-02-09 – 2021-02-11 (×6): 237 mL via ORAL

## 2021-02-08 MED ORDER — PREDNISONE 20 MG PO TABS
20.0000 mg | ORAL_TABLET | Freq: Two times a day (BID) | ORAL | Status: DC
  Administered 2021-02-08 – 2021-02-09 (×2): 20 mg via ORAL
  Filled 2021-02-08 (×2): qty 1

## 2021-02-08 MED ORDER — ALUM & MAG HYDROXIDE-SIMETH 200-200-20 MG/5ML PO SUSP: 30.0000 mL | ORAL | Status: DC | PRN

## 2021-02-08 NOTE — Evaluation (Addendum)
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   Cough 03/02/2017   INTERMTTENT DRY COUGH   Depression    GERD (gastroesophageal reflux disease)    History of hiatal hernia    Personal history of chemotherapy    Personal history of radiation therapy    Skin cancer    Throat cancer ([place]) 1998   radiation   Past Surgical History:  Past Surgical History:  Procedure Laterality Date   BREAST BIOPSY Right 1997   positive   BREAST BIOPSY Right 11/15/2017   INVASIVE MAMMARY CARCINOMA triple negative   BREAST BIOPSY Right 07/20/2020   [place] bx, Q marker, path pending   BREAST LUMPECTOMY Right 1997   2019 also   BREAST LUMPECTOMY WITH SENTINEL LYMPH NODE BIOPSY Right 12/08/2017   Procedure: BREAST LUMPECTOMY WITH SENTINEL LYMPH NODE BX;  Surgeon: Robert Bellow, MD;  Location: [facility];  Service: General;  Laterality: Right;   BREAST REDUCTION WITH MASTOPEXY Left 06/10/2019   Procedure: Left breast mastopexy reduction for symmetry;  Surgeon: Wallace Going, DO;  Location: [place];  Service: Plastics;  Laterality: Left;  total 2.5 hours   COLONOSCOPY  2015   COLONOSCOPY WITH PROPOFOL N/A 10/29/2019   Procedure: COLONOSCOPY WITH PROPOFOL;  Surgeon: Lucilla Lame, MD;  Location: [facility] END[facility]COPY;  Service: Endoscopy;  Laterality: N/A;   ESOPHAGOGASTRODUODENOSCOPY (EGD) WITH PROPOFOL N/A 05/24/2019   Procedure:  ESOPHAGOGASTRODUODENOSCOPY (EGD) WITH PROPOFOL;  Surgeon: Lucilla Lame, MD;  Location: [facility] ENDOSCOPY;  Service: Endoscopy;  Laterality: N/A;   ESOPHAGUS SURGERY     KNEE SURGERY Right    LIPOSUCTION WITH LIPOFILLING Right 06/10/2019   Procedure: right breast scar and capsule release with fat grafting;  Surgeon: Wallace Going, DO;  Location: [place];  Service: Plastics;  Laterality: Right;   PORT-A-CATH REMOVAL     PORTACATH PLACEMENT Right 01/17/2018   Procedure: INSERTION PORT-A-CATH- RIGHT;  Surgeon: Robert Bellow, MD;  Location: [facility];  Service: General;  Laterality: Right;   ROBOTIC ADRENALECTOMY Right 12/28/2016   Procedure: ROBOTIC ADRENALECTOMY;  Surgeon: Hollice Espy, MD;  Location: [facility];  Service: Urology;  Laterality: Right;   SKIN CANCER EXCISION     THROAT SURGERY  1998   throat cancer    TUBAL LIGATION     VENTRAL HERNIA REPAIR N/A 03/03/2017   Procedure: HERNIA REPAIR VENTRAL ADULT;  Surgeon: Clayburn Pert, MD;  Location: [facility];  Service: General;  Laterality: N/A;   HPI: Pt is a 65yo F whx of laryngeal CA s/p radiation therapy in 1998 with residual VCD, COPD/emphysema, Severe GERD with Hiatal hernia, hx of Breast CA s/p lumpectomy and radiation, MDD, anxiety disorder OA came in with complaints of cough and dyspnea and was +for COVID 01/27/21 noted to be hypoxemic in ER on admission with mild leukocytosis. She had CTPE done w/ findings including bronchitic changes bilaterally with LLL  platelike atelectasis worse posteriorly as well as generous PA diameter with BM:WUXLK index >1 suggestive of PH. Pictorial documentation included in imaging section below.  Patient able to take IS tidal volume to 750cc non labored but coughs with deep inspiration. She reports feeling inspissated phelgm in chest but too weak to expectorate debris.  CT Chest Imagining: atelectasis is seen within the posterior aspects  of the bilateral lung left greater than right.  There is no evidence of acute infiltrate, pleural effusion or  pneumothorax.   Subjective: awake, verbal and followed instructions appropriately. Dysphonia.    Assessment / Plan / Recommendation  CHL IP CLINICAL IMPRESSIONS 02/08/2021  Clinical Impression Pt appears to present w/ Moderate-Severe pharyngoesophageal phase dysphagia w/ Mild oral phase dysphagia per this assessment today. Pt presents w/ Dysphonia w/ weak Cough she stated has been ongoing for ~1+ years. She has not been seen by ENT, nor speech therapy. She has had GI f/u for Esophageal Dilation -- distal Esophageal Dysmotility is noted per EGD/notes in pt's charts.   During the Pharyngeal phase, swallow initiation most often occured as the bolus contacts the posterior surface of the epiglottis with liquids. Pharyngeal stage is noted for: reduced hyolaryngeal excursion, decreased pharyngeal peristosis, reduced anterior laryngeal mobility, reduced airway/laryngeal closure, and reduced base of tongue retraction. Epiglottic deflection is partial; penetration/aspiration occurs during the swallow with thin liquids due to reduced laryngeal closure. With Nectar liquids, laryngeal penetration was noted but no immediate aspiration. Pt is unable to protect the airway or generate protective cough when penetration/aspiration occurs d/t Vocal Cord Dysfunction. Pt had much delayed sensation of aspiration. MODERATE residue remained in the valleculae and pyriform sinuses post initial swallow; this appears attributable to reduction of flow through the pharyngoesophageal segment (reduced amplitude/duration of PE segment opening due to weak hyolaryngeal excursion). In addition, pt has what appears to be a Diverticulum just below the PE segment which remained full of bolus material. Pt had reduced CP segment opening; timing was brief and bolus motility was reduced through the CP segment. Using a [employer] strategy while swallowing appeared helpful in aiding CP  segment opening and bolus movement through the UES and into the Esophagus. The apparent Diverticulum just below the CP segment retained bolus in the space, AND Retrograde bolus activity was noted back into the Pyriform Sinus from the Esophagus -- suspect this impacted bolus motility in the lower pharynx and through the UES as well.  Oral phase was c/b adequate bolus control and containment in order to use some strategies. Mildly decreased lingual strength resulted in reduced cohesiveness for full A-P transfer thus oral residue requiring lingual sweeping and f/u swallow. Pt has MOD++ Thrush currently.  Compensatory strategies were attempted: Chin Tuck, Effortful Swallows, Multiple Swallows. These appeared effective in preventing immediate laryngeal penetration/aspiration of Nectar liquids but not thin liquids, and in reducing pharyngeal residue but not clearing it. The [employer] strategy appeared to aid in during of PE segment opening for more bolus clearing. Solid(cracker) was not assessed d/t dysmotility through the PE segment. Consistencies tested were thin liquids(2, then 3 w/ chin tuck) and Nectar liquids(2, 3 w/ chin tuck), and puree x3 1/2 tsps(chin tuck). Pt required min+ verbal cues and reminders to use the strategies.   Recommend a more modified diet of Dys. 1 (Puree) diet and Nectar thick liquids by Cup, no straws; swallowing strategies as stated above. Pills Crushed in a puree. Due to efforts of oral intake, degree of Dysphagia, and pt's reduced oral intake recently,  pt would benefit from Dietician f/u for nutrition/hydration support. Consulted MD re: further Medication/tx for pt's Thrush -- systemic focused tx d/t pt's dysphagia and inability to swallow liquid rinse. Discussed w/ MD in pt's room post evaluation.  SLP Visit Diagnosis Dysphagia, pharyngoesophageal phase (R13.14);Dysphagia, pharyngeal phase (R13.13)  Attention and concentration deficit following --  Frontal lobe and executive  function deficit following --  Impact on safety and function Moderate aspiration risk;Severe aspiration risk;Risk for inadequate nutrition/hydration      CHL IP TREATMENT RECOMMENDATION 02/08/2021  Treatment Recommendations Therapy as outlined in treatment plan below     Prognosis 02/08/2021  Prognosis for Safe Diet Advancement Fair  Barriers to Reach Goals Time post onset;Severity of deficits  Barriers/Prognosis Comment time since onset; XRT late effects    CHL IP DIET RECOMMENDATION 02/08/2021  SLP Diet Recommendations Dysphagia 1 (Puree) solids;Nectar thick liquid  Liquid Administration via Cup;No straw  Medication Administration Crushed with puree  Compensations Minimize environmental distractions;Slow rate;Small sips/bites;Follow solids with liquid;Lingual sweep for clearance of pocketing;Multiple dry swallows after each bite/sip;Chin tuck;Hard cough after swallow  Postural Changes Remain semi-upright after after feeds/meals (Comment);Seated upright at 90 degrees      CHL IP OTHER RECOMMENDATIONS 02/08/2021  Recommended Consults Consider ENT evaluation;Consider GI evaluation  Oral Care Recommendations Oral care BID;Oral care before and after PO;Patient independent with oral care. Consulted MD re: further Medication/tx for pt's Thrush -- systemic focused tx d/t pt's dysphagia and inability to swallow liquid rinse.  Other Recommendations Order thickener from pharmacy;Prohibited food (jello, ice cream, thin soups);Remove water pitcher;Have oral suction available      CHL IP FOLLOW UP RECOMMENDATIONS 02/08/2021  Follow up Recommendations Outpatient SLP      CHL IP FREQUENCY AND DURATION 02/08/2021  Speech Therapy Frequency (ACUTE ONLY) min 3x week  Treatment Duration 2 weeks           CHL IP ORAL PHASE 02/08/2021  Oral Phase Impaired  Oral - Pudding Teaspoon --  Oral - Pudding Cup --  Oral - Honey Teaspoon --  Oral - Honey Cup --  Oral - Nectar Teaspoon --  Oral - Nectar  Cup --  Oral - Nectar Straw --  Oral - Thin Teaspoon --  Oral - Thin Cup --  Oral - Thin Straw --  Oral - Puree --  Oral - Mech Soft --  Oral - Regular --  Oral - Multi-Consistency --  Oral - Pill --  Oral Phase - Comment --    CHL IP PHARYNGEAL PHASE 02/08/2021  Pharyngeal Phase Impaired  Pharyngeal- Pudding Teaspoon --  Pharyngeal --  Pharyngeal- Pudding Cup --  Pharyngeal --  Pharyngeal- Honey Teaspoon --  Pharyngeal --  Pharyngeal- Honey Cup --  Pharyngeal --  Pharyngeal- Nectar Teaspoon Delayed swallow initiation-vallecula;Reduced epiglottic inversion;Reduced anterior laryngeal mobility;Reduced pharyngeal peristalsis;Reduced laryngeal elevation;Reduced airway/laryngeal closure;Reduced tongue base retraction;Pharyngeal residue - valleculae;Pharyngeal residue - pyriform;Pharyngeal residue - posterior pharnyx;Pharyngeal residue - cp segment;Lateral channel residue  Pharyngeal --  Pharyngeal- Nectar Cup Delayed swallow initiation-vallecula;Reduced pharyngeal peristalsis;Reduced epiglottic inversion;Reduced anterior laryngeal mobility;Reduced laryngeal elevation;Reduced airway/laryngeal closure;Reduced tongue base retraction;Penetration/Aspiration during swallow;Penetration/Apiration after swallow;Pharyngeal residue - pyriform;Pharyngeal residue - posterior pharnyx;Pharyngeal residue - cp segment;Inter-arytenoid space residue;Lateral channel residue;Compensatory strategies attempted (with notebox)  Pharyngeal --  Pharyngeal- Nectar Straw --  Pharyngeal --  Pharyngeal- Thin Teaspoon Reduced pharyngeal peristalsis;Reduced epiglottic inversion;Reduced anterior laryngeal mobility;Reduced laryngeal elevation;Reduced airway/laryngeal closure;Reduced tongue base retraction;Pharyngeal residue - valleculae;Pharyngeal residue - pyriform;Lateral channel residue;Delayed swallow initiation-vallecula  Pharyngeal --  Pharyngeal- Thin  Cup Delayed swallow initiation-pyriform sinuses;Reduced pharyngeal  peristalsis;Reduced epiglottic inversion;Reduced anterior laryngeal mobility;Reduced laryngeal elevation;Reduced airway/laryngeal closure;Reduced tongue base retraction;Penetration/Aspiration during swallow;Penetration/Apiration after swallow;Moderate aspiration;Pharyngeal residue - cp segment;Lateral channel residue;Compensatory strategies attempted (with notebox)  Pharyngeal --  Pharyngeal- Thin Straw --  Pharyngeal --  Pharyngeal- Puree Delayed swallow initiation-vallecula;Reduced pharyngeal peristalsis;Reduced epiglottic inversion;Reduced anterior laryngeal mobility;Reduced laryngeal elevation;Reduced airway/laryngeal closure;Reduced tongue base retraction;Pharyngeal residue - valleculae;Pharyngeal residue - pyriform;Pharyngeal residue - posterior pharnyx;Pharyngeal residue - cp segment;Lateral channel residue;Compensatory strategies attempted (with notebox);Inter-arytenoid space residue  Pharyngeal --  Pharyngeal- Mechanical Soft --  Pharyngeal --  Pharyngeal- Regular --  Pharyngeal --  Pharyngeal- Multi-consistency --  Pharyngeal --  Pharyngeal- Pill --  Pharyngeal --  Pharyngeal Comment pt is at risk for laryngeal penetration after the swallow d/t pharyngeal residue throughout     CHL IP CERVICAL ESOPHAGEAL PHASE 02/08/2021  Cervical Esophageal Phase Impaired  Pudding Teaspoon --  Pudding Cup --  Honey Teaspoon --  Honey Cup --  Nectar Teaspoon --  Nectar Cup --  Nectar Straw --  Thin Teaspoon --  Thin Cup --  Thin Straw --  Puree --  Mechanical Soft --  Regular --  Multi-consistency --  Pill --  Cervical Esophageal Comment pt had reduced CP segment opening; timing was brief and bolus motility was reduced through the CP segment. Using a [employer] strategy while swallwoing appeared helpful in aiding CP segment opening and bolus movement through the UES and into the Esophagus. Noted what appeared to be a Diverticulum just below to the CP segment. Bolus residue retained in the  space, AND Retrograde bolus activity was noted back into the Pyriform Sinus -- suspect this impacted bolus motility through the UES as well.            Orinda Kenner, MS, CCC-SLP Speech Language Pathologist Rehab Services [phone] [facility] 02/08/2021, 4:54 PM

## 2021-02-08 NOTE — Progress Notes (Signed)
Patient off unit for swallow study in stable condition.

## 2021-02-08 NOTE — Progress Notes (Signed)
Denise Watts  BTD:[id number] DOB: 1956-03-16 DOA: 01/31/2021 PCP: Margo Common, PA-C (Inactive)    Brief Narrative:  [phone] w/ a hx of laryngeal cancer status post radiation therapy 1998 with residual VCD, emphysema (45 pack year hx/stopped 1997), breast cancer status post lumpectomy and XRT 2019, anxiety, dysphagia, B12 and vitamin D deficiencies, right adrenal mass, tobacco abuse, and dyslipidemia who was seen in the ED with complaints of shortness of breath.  Patient had COVID symptoms with cough & congestion for about 10 days and was diagnosed with COVID on multiple home tests, as well as in the ED on 01/29/2021..  Date of Positive COVID Test:  01/29/21 (last day of isolation 02/08/21)  COVID-19 specific Treatment: Remdesivir 10/3 > 10/5 Steroid 10/2 >  Consultants:  Pulmonary  Code Status: FULL CODE  Antimicrobials:  Azithromycin 10/3 > 10/4 Rocephin 10/3 > 10/5  DVT prophylaxis: Lovenox  Subjective: No acute events reported overnight.  Afebrile.  Mildly tachycardic this morning at 106.  Blood pressure at times modestly elevated.  Saturation 100% room air.  Remains dyspneic at rest and worse with exertion.  Feels weak in general.  Assessment & Plan:  Acute hypoxic respiratory failure -acute bronchospastic COPD exacerbation - resolved  continue inhaled medications (which are limited by CoViD diagnosis) - continue steroid - no evidence of pneumonia on CT chest and procalcitonin <0.1 -patient quit smoking years ago - Pulmonary has seen in consultation - full RVP negative   Persistent Dyspnea  Appears to be a multifactorial rather complex issue, with contributions to include vocal cord dysfunction, possible stenosis of the larynx, probable esophageal dysmotility, possible esophageal diverticulum, and probable esophageal stricture -dysphagia confirmed by MBS today with diet to be modified -will need ongoing outpatient SLP therapy -due to her dysphagia cannot complete a formal  barium esophagram -we will ask GI to see to consider EGD to evaluate for esophageal stricture and possible diverticulum (either as inpt or outpt) - will benefit from outpatient ENT eval and possible laryngoscopy and follow-up given her prior history of vocal cord polyps and cancer  COVID - incidental  Suspect this is not the primary issue at play here -stopped Remdesivir -continue steroid but not for COVID and rather for treatment of COPD -no specific therapy is indicated for COVID at this time -no parenchymal disease noted on CT scan chest -inflammatory markers not suggestive of an acute phase of this infection  Anxiety and depression Continue usual BuSpar and Cymbalta - perhaps this is contributing to her sx as well   History of B12 deficiency Review of records notes patient has a history of a severe B12 deficiency - B12 565 during this admission  Hyperglycemia No prior history of DM -likely simply due to steroids -A1c 6.4 therefore not yet meeting criteria for diabetes -discussed prediabetic state with patient - wean steroids asap  E. coli UTI v/s asymptomatic bacteruria  Multiple prior urine cultures in this patient have revealed the same organism -the patient does not presently have dysuria or other symptoms that I feel strongly suggest an active UTI -I suspect her urinary tract is simply colonized with this bacteria -monitor without specific directed antibiotic therapy  GERD Continue usual PPI - Pulmonary suspects possible esophageal diverticulum - see above regarding GI eval   Family Communication: No family present at time of exam today Status is: Inpatient  Remains inpatient appropriate because:Inpatient level of care appropriate due to severity of illness  Dispo: The patient is from: Home  Anticipated d/c is to: Home              Patient currently is not medically stable to d/c.   Difficult to place patient No   Objective: Blood pressure (!) 142/95, pulse (!)  106, temperature 98 F (36.7 C), temperature source Oral, resp. rate 19, height 5\' 1"  (1.549 m), weight 62 kg, SpO2 100 %. No intake or output data in the 24 hours ending 02/08/21 0829  Filed Weights   01/31/21 1903  Weight: 62 kg    Examination: General: Not in extremis -remains dyspneic at rest but stable Lungs: Distant breath sounds with poor air movement in all fields, no expiratory wheezing today, no focal crackles  Cardiovascular: Regular rate and rhythm without murmur  Abdomen: NT/ND, soft, bowel sounds positive, no rebound, no ascites Extremities: No C/C/E B lower extremities  CBC: Recent Labs  Lab 02/07/21 0401  WBC 9.6  HGB 13.2  HCT 40.0  MCV 98.0  PLT 194    Basic Metabolic Panel: Recent Labs  Lab 02/07/21 0401  NA 139  K 4.6  CL 100  CO2 29  GLUCOSE 165*  BUN 19  CREATININE 0.87  CALCIUM 8.9    GFR: Estimated Creatinine Clearance: 53 mL/min (by C-G formula based on SCr of 0.87 mg/dL).   Recent Results (from the past 240 hour(s))  SARS CORONAVIRUS 2 (TAT 6-24 HRS) Nasopharyngeal Nasopharyngeal Swab     Status: Abnormal   Collection Time: 01/29/21  6:05 PM   Specimen: Nasopharyngeal Swab  Result Value Ref Range Status   SARS Coronavirus 2 POSITIVE (A) NEGATIVE Final    Comment: (NOTE) SARS-CoV-2 target nucleic acids are DETECTED.  The SARS-CoV-2 RNA is generally detectable in upper and lower respiratory specimens during the acute phase of infection. Positive results are indicative of the presence of SARS-CoV-2 RNA. Clinical correlation with patient history and other diagnostic information is  necessary to determine patient infection status. Positive results do not rule out bacterial infection or co-infection with other viruses.  The expected result is Negative.  Fact Sheet for Patients: SugarRoll.be  Fact Sheet for Healthcare Providers: https://www.woods-mathews.com/  This test is not yet approved  or cleared by the [place] FDA and  has been authorized for detection and/or diagnosis of SARS-CoV-2 by FDA under an Emergency Use Authorization (EUA). This EUA will remain  in effect (meaning this test can be used) for the duration of the COVID-19 declaration under Section 564(b)(1) of the Act, 21 U. S.C. section 360bbb-3(b)(1), unless the authorization is terminated or revoked sooner.   Performed at [facility], [place] [address], [place], [place] [address]   Urine Culture     Status: Abnormal   Collection Time: 01/31/21 11:41 PM   Specimen: Urine, Random  Result Value Ref Range Status   Specimen Description   Final    URINE, RANDOM Performed at [facility], [address] [place], [place] [address]    Special Requests   Final    NONE Performed at [facility], [address] [place], [place] [address]    Culture >=100,000 COLONIES/mL ESCHERICHIA COLI (A)  Final   Report Status 02/02/2021 FINAL  Final   Organism ID, Bacteria ESCHERICHIA COLI (A)  Final      Susceptibility   Escherichia coli - MIC*    AMPICILLIN <=2 SENSITIVE Sensitive     CEFAZOLIN <=4 SENSITIVE Sensitive     CEFEPIME <=0.12 SENSITIVE Sensitive     CEFTRIAXONE <=0.25 SENSITIVE  Sensitive     CIPROFLOXACIN <=0.25 SENSITIVE Sensitive     GENTAMICIN <=1 SENSITIVE Sensitive     IMIPENEM <=0.25 SENSITIVE Sensitive     NITROFURANTOIN <=16 SENSITIVE Sensitive     TRIMETH/SULFA <=20 SENSITIVE Sensitive     AMPICILLIN/SULBACTAM <=2 SENSITIVE Sensitive     PIP/TAZO <=4 SENSITIVE Sensitive     * >=100,000 COLONIES/mL ESCHERICHIA COLI  Blood culture (routine x 2)     Status: None   Collection Time: 02/01/21 12:56 AM   Specimen: BLOOD  Result Value Ref Range Status   Specimen Description BLOOD LEFT ANTECUBITAL  Final   Special Requests   Final    BOTTLES DRAWN AEROBIC AND ANAEROBIC Blood Culture adequate volume   Culture   Final    NO GROWTH 5 DAYS Performed at [facility], [address] [place], [place] [address]    Report Status 02/06/2021 FINAL  Final  Culture, blood (Routine X 2) w Reflex to ID Panel     Status: None   Collection Time: 02/01/21  1:42 AM   Specimen: BLOOD  Result Value Ref Range Status   Specimen Description BLOOD LEFT FOREARM  Final   Special Requests   Final    BOTTLES DRAWN AEROBIC AND ANAEROBIC Blood Culture adequate volume   Culture   Final    NO GROWTH 5 DAYS Performed at [facility], [address] [place], [place] [address]    Report Status 02/06/2021 FINAL  Final  Respiratory (~20 pathogens) panel by PCR     Status: None   Collection Time: 02/06/21  1:40 PM   Specimen: Nasopharyngeal Swab; Respiratory  Result Value Ref Range Status   Adenovirus NOT DETECTED NOT DETECTED Final   Coronavirus 229E NOT DETECTED NOT DETECTED Final    Comment: (NOTE) The Coronavirus on the Respiratory Panel, DOES NOT test for the novel  Coronavirus (2019 nCoV)    Coronavirus HKU1 NOT DETECTED NOT DETECTED Final   Coronavirus NL63 NOT DETECTED NOT DETECTED Final   Coronavirus OC43 NOT DETECTED NOT DETECTED Final   Metapneumovirus NOT DETECTED NOT DETECTED Final   Rhinovirus / Enterovirus NOT DETECTED NOT DETECTED Final   Influenza A NOT DETECTED NOT DETECTED Final   Influenza B NOT DETECTED NOT DETECTED Final   Parainfluenza Virus 1 NOT DETECTED NOT DETECTED Final   Parainfluenza Virus 2 NOT DETECTED NOT DETECTED Final   Parainfluenza Virus 3 NOT DETECTED NOT DETECTED Final   Parainfluenza Virus 4 NOT DETECTED NOT DETECTED Final   Respiratory Syncytial Virus NOT DETECTED NOT DETECTED Final   Bordetella pertussis NOT DETECTED NOT DETECTED Final   Bordetella Parapertussis NOT DETECTED NOT DETECTED Final   Chlamydophila pneumoniae NOT DETECTED NOT DETECTED Final   Mycoplasma pneumoniae NOT DETECTED NOT DETECTED Final    Comment: Performed at [facility], [place], [place], [place] [address]  MRSA Next Gen by  PCR, Nasal     Status: None   Collection Time: 02/06/21  2:50 PM   Specimen: Nasal Mucosa; Nasal Swab  Result Value Ref Range Status   MRSA by PCR Next Gen NOT DETECTED NOT DETECTED Final    Comment: (NOTE) The GeneXpert MRSA Assay (FDA approved for NASAL specimens only), is one component of a comprehensive MRSA colonization surveillance program. It is not intended to diagnose MRSA infection nor to guide or monitor treatment for MRSA infections. Test performance is not FDA approved in patients less than 73 years old. Performed at [facility],  [place], [place] [address]       Scheduled Meds:  aspirin EC  81 mg Oral Daily   atorvastatin  40 mg Oral Daily   busPIRone  5 mg Oral BID   DULoxetine  60 mg Oral Daily   enoxaparin (LOVENOX) injection  40 mg Subcutaneous Q24H   fluticasone  1 spray Each Nare QHS   gabapentin  300 mg Oral TID   insulin aspart  0-9 Units Subcutaneous TID WC   Ipratropium-Albuterol  1 puff Inhalation Q6H   nystatin  5 mL Oral QID   oxybutynin  15 mg Oral Daily   pantoprazole (PROTONIX) IV  40 mg Intravenous Q12H   predniSONE  40 mg Oral BID WC   rOPINIRole  0.25 mg Oral QHS   zolpidem  5 mg Oral QHS      LOS: 7 days   [name], MD [facility] Office  [phone] Pager - Text Page per Shea Evans  If 7PM-7AM, please contact night-coverage per Amion 02/08/2021, 8:29 AM

## 2021-02-08 NOTE — Progress Notes (Signed)
Pulmonology         Date: 02/08/2021,   MRN# [id number] CELISE BAZAR 02/26/1956     AdmissionWeight: 62 kg                 CurrentWeight: 34 kg   Referring physician: Dr Thereasa Solo   CHIEF COMPLAINT:   Persistent dyspnea and SOB at rest   HISTORY OF PRESENT ILLNESS AND SUBJECTIVE FINDINGS    65yo F whx of laryngeal CA s/p radiation therapy with residual VCD, COPD/emphysema, severe GERD with hiatal hernia, hx of Breast CA s/p lumpectomy and radiation, MDD, anxiety disorder OA came in with complaints of cough and dyspnea and was +for COVID 01/27/21 noted to be hypoxemic in ER on admission with mild leukocytosis. She had CTPE done which I reviewed independently, findings include bronchitic changes bilaterally with LLL platelike atelectasis worse posteriorly as well as generous PA diameter with JO:INOMV index >1 suggestive of PH. Pictorial documentation included in imaging section below.  Patient able to take IS tidal volume to 750cc non labored but coughs with deep inspiration. She reports feeling inspissated phelgm in chest but too weak to expectorate debris. PCCM consult for further eval and mgt.   02/07/21- Patient has not had RVP done yet.  She was unable to get barium swallow done due to only being available on weekdays and this is scheduled for Monday.  Husband was at bedside we reviewed medical plan. She continues to use IS and flutter valve for BPH.  Lab work with CRP borderline elevated. Phenol spray and viscous lidocaine added for throat discomfort due to Larygopharyngeal reflux.   02/08/21- patient reports improvement.  She still is not back to baseline reports dyspnea with showering. I have reordered barium swallow today as requested by radiology.   PAST MEDICAL HISTORY   Past Medical History:  Diagnosis Date  . Anxiety   . Arthritis    KNEE RIGHT  . Breast cancer [facility]) 1997   right breast lumpectomy with radiation  . Breast cancer ([place]) 11/15/2017   INVASIVE  MAMMARY CARCINOMA/ triple negative  . 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 Cough 03/02/2017   INTERMTTENT DRY COUGH  . Depression   . GERD (gastroesophageal reflux disease)   . History of hiatal hernia   . Personal history of chemotherapy   . Personal history of radiation therapy   . Skin cancer   . Throat cancer ([place]) 1998   radiation     SURGICAL HISTORY   Past Surgical History:  Procedure Laterality Date  . BREAST BIOPSY Right 1997   positive  . BREAST BIOPSY Right 11/15/2017   INVASIVE MAMMARY CARCINOMA triple negative  . BREAST BIOPSY Right 07/20/2020   [place] bx, Q marker, path pending  . BREAST LUMPECTOMY Right 1997   2019 also  . BREAST LUMPECTOMY WITH SENTINEL LYMPH NODE BIOPSY Right 12/08/2017   Procedure: BREAST LUMPECTOMY WITH SENTINEL LYMPH NODE BX;  Surgeon: Robert Bellow, MD;  Location: [facility];  Service: General;  Laterality: Right;  . BREAST REDUCTION WITH MASTOPEXY Left 06/10/2019   Procedure: Left breast mastopexy reduction for symmetry;  Surgeon: Wallace Going, DO;  Location: [place];  Service: Plastics;  Laterality: Left;  total 2.5 hours  . COLONOSCOPY  2015  . COLONOSCOPY WITH PROPOFOL N/A 10/29/2019   Procedure: COLONOSCOPY WITH PROPOFOL;  Surgeon: Lucilla Lame, MD;  Location: [facility] ENDOSCOPY;  Service: Endoscopy;  Laterality: N/A;  . ESOPHAGOGASTRODUODENOSCOPY (EGD) WITH PROPOFOL N/A  05/24/2019   Procedure: ESOPHAGOGASTRODUODENOSCOPY (EGD) WITH PROPOFOL;  Surgeon: Lucilla Lame, MD;  Location: [facility] ENDOSCOPY;  Service: Endoscopy;  Laterality: N/A;  . ESOPHAGUS SURGERY    . KNEE SURGERY Right   . LIPOSUCTION WITH LIPOFILLING Right 06/10/2019   Procedure: right breast scar and capsule release with fat grafting;  Surgeon: Wallace Going, DO;  Location: [place];  Service: Plastics;  Laterality: Right;  . PORT-A-CATH REMOVAL    . PORTACATH PLACEMENT Right 01/17/2018   Procedure: INSERTION PORT-A-CATH- RIGHT;   Surgeon: Robert Bellow, MD;  Location: [facility];  Service: General;  Laterality: Right;  . ROBOTIC ADRENALECTOMY Right 12/28/2016   Procedure: ROBOTIC ADRENALECTOMY;  Surgeon: Hollice Espy, MD;  Location: [facility];  Service: Urology;  Laterality: Right;  . SKIN CANCER EXCISION    . THROAT SURGERY  1998   throat cancer   . TUBAL LIGATION    . VENTRAL HERNIA REPAIR N/A 03/03/2017   Procedure: HERNIA REPAIR VENTRAL ADULT;  Surgeon: Clayburn Pert, MD;  Location: [facility];  Service: General;  Laterality: N/A;     FAMILY HISTORY   Family History  Problem Relation Age of Onset  . Diabetes Mother   . Heart attack Mother   . Cancer Sister        cancer of eyes, spread to lung, other places. died at 67  . Cancer Sister 42       unknown primary, spread to bone, brain died in 59's  . Cancer Paternal Aunt        unk type  . Cancer Paternal Grandmother        type unk  . Prostate cancer Neg Hx   . Kidney cancer Neg Hx   . Bladder Cancer Neg Hx      SOCIAL HISTORY   Social History   Tobacco Use  . Smoking status: Former    Packs/day: 1.50    Years: 30.00    Pack years: 45.00    Types: Cigarettes    Quit date: 05/03/1995    Years since quitting: 25.7  . Smokeless tobacco: Never  Vaping Use  . Vaping Use: Never used  Substance Use Topics  . Alcohol use: Yes    Alcohol/week: 4.0 standard drinks    Types: 4 Shots of liquor per week    Comment: 2 per night on weekends  . Drug use: No     MEDICATIONS    Home Medication:    Current Medication:  Current Facility-Administered Medications:  .  albuterol (VENTOLIN HFA) 108 (90 Base) MCG/ACT inhaler 2 puff, 2 puff, Inhalation, Q4H PRN, [name], MD .  aspirin EC tablet 81 mg, 81 mg, Oral, Daily, Florina Ou V, MD, 81 mg at 02/08/21 0810 .  atorvastatin (LIPITOR) tablet 40 mg, 40 mg, Oral, Daily, Florina Ou V, MD, 40 mg at 02/08/21 0810 .  busPIRone (BUSPAR) tablet 5 mg, 5 mg, Oral, BID, Para Skeans, MD, 5  mg at 02/08/21 0810 .  cyclobenzaprine (FLEXERIL) tablet 5 mg, 5 mg, Oral, TID PRN, Nolberto Hanlon, MD, 5 mg at 02/05/21 2103 .  DULoxetine (CYMBALTA) DR capsule 60 mg, 60 mg, Oral, Daily, Florina Ou V, MD, 60 mg at 02/08/21 0810 .  enoxaparin (LOVENOX) injection 40 mg, 40 mg, Subcutaneous, Q24H, [name], MD, 40 mg at 02/07/21 2106 .  fluticasone (FLONASE) 50 MCG/ACT nasal spray 1 spray, 1 spray, Each Nare, QHS, Para Skeans, MD, 1 spray at 02/07/21 2106 .  gabapentin (NEURONTIN) capsule 300 mg, 300 mg, Oral, TID, Para Skeans, MD, 300 mg at 02/08/21 0810 .  guaiFENesin-dextromethorphan (ROBITUSSIN DM) 100-10 MG/5ML syrup 5-10 mL, 5-10 mL, Oral, Q4H PRN, Nolberto Hanlon, MD, 10 mL at 02/06/21 2110 .  HYDROcodone-acetaminophen (NORCO/VICODIN) 5-325 MG per tablet 1 tablet, 1 tablet, Oral, Q4H PRN, Para Skeans, MD, 1 tablet at 02/07/21 2104 .  insulin aspart (novoLOG) injection 0-9 Units, 0-9 Units, Subcutaneous, TID WC, [name], MD, 1 Units at 02/08/21 [phone] .  Ipratropium-Albuterol (COMBIVENT) respimat 1 puff, 1 puff, Inhalation, Q6H, [name], MD, 1 puff at 02/08/21 0813 .  [COMPLETED] alum & mag hydroxide-simeth (MAALOX/MYLANTA) 200-200-20 MG/5ML suspension 30 mL, 30 mL, Oral, Once, 30 mL at 02/07/21 1521 **AND** lidocaine (XYLOCAINE) 2 % viscous mouth solution 15 mL, 15 mL, Oral, Q3H PRN, Lanney Gins, Shanaya Schneck, MD .  nystatin (MYCOSTATIN) 100000 UNIT/ML suspension 500,000 Units, 5 mL, Oral, QID, Sharion Settler, NP, 500,000 Units at 02/08/21 [phone] .  oxybutynin (DITROPAN-XL) 24 hr tablet 15 mg, 15 mg, Oral, Daily, Florina Ou V, MD, 15 mg at 02/08/21 0811 .  pantoprazole (PROTONIX) injection 40 mg, 40 mg, Intravenous, Q12H, Lanney Gins, Riyanshi Wahab, MD, 40 mg at 02/08/21 0813 .  predniSONE (DELTASONE) tablet 40 mg, 40 mg, Oral, BID WC, [name], MD, 40 mg at 02/08/21 0810 .  rOPINIRole (REQUIP) tablet 0.25 mg, 0.25 mg, Oral, QHS, Amery, Sahar, MD, 0.25 mg at 02/07/21  2104 .  zolpidem (AMBIEN) tablet 5 mg, 5 mg, Oral, QHS, [name], MD, 5 mg at 02/07/21 2104  Facility-Administered Medications Ordered in Other Encounters:  .  cyanocobalamin ((VITAMIN B-12)) injection 1,000 mcg, 1,000 mcg, Intramuscular, Q30 days, Sindy Guadeloupe, MD, 1,000 mcg at 09/28/18 1013 .  cyanocobalamin ((VITAMIN B-12)) injection 1,000 mcg, 1,000 mcg, Intramuscular, Q30 days, Sindy Guadeloupe, MD, 1,000 mcg at 11/30/18 1355 .  cyanocobalamin ((VITAMIN B-12)) injection 1,000 mcg, 1,000 mcg, Intramuscular, Q30 days, Sindy Guadeloupe, MD, 1,000 mcg at 08/16/19 1144    ALLERGIES   Sulfa antibiotics     REVIEW OF SYSTEMS    Review of Systems:  Gen:  Denies  fever, sweats, chills weigh loss  HEENT: Denies blurred vision, double vision, ear pain, eye pain, hearing loss, nose bleeds, sore throat Cardiac:  No dizziness, chest pain or heaviness, chest tightness,edema Resp:   Denies cough or sputum porduction, shortness of breath,wheezing, hemoptysis,  Gi: Denies swallowing difficulty, stomach pain, nausea or vomiting, diarrhea, constipation, bowel incontinence Gu:  Denies bladder incontinence, burning urine Ext:   Denies Joint pain, stiffness or swelling Skin: Denies  skin rash, easy bruising or bleeding or hives Endoc:  Denies polyuria, polydipsia , polyphagia or weight change Psych:   Denies depression, insomnia or hallucinations   Other:  All other systems negative   VS: BP (!) 142/95 (BP Location: Left Wrist)   Pulse (!) 106   Temp 98 F (36.7 C) (Oral)   Resp 19   Ht 5' 1"  (1.549 m)   Wt 62 kg   SpO2 100%   BMI 25.83 kg/m      PHYSICAL EXAM    GENERAL:NAD, no fevers, chills, no weakness no fatigue HEAD: Normocephalic, atraumatic.  EYES: Pupils equal, round, reactive to light. Extraocular muscles intact. No scleral icterus.  MOUTH: Moist mucosal membrane. Dentition intact. No abscess noted.  EAR, NOSE, THROAT: Clear without exudates. No external  lesions.  NECK: Supple. No thyromegaly. No nodules. No JVD.  PULMONARY: Diffuse coarse rhonchi right  sided +wheezes CARDIOVASCULAR: S1 and S2. Regular rate and rhythm. No murmurs, rubs, or gallops. No edema. Pedal pulses 2+ bilaterally.  GASTROINTESTINAL: Soft, nontender, nondistended. No masses. Positive bowel sounds. No hepatosplenomegaly.  MUSCULOSKELETAL: No swelling, clubbing, or edema. Range of motion full in all extremities.  NEUROLOGIC: Cranial nerves II through XII are intact. No gross focal neurological deficits. Sensation intact. Reflexes intact.  SKIN: No ulceration, lesions, rashes, or cyanosis. Skin warm and dry. Turgor intact.  PSYCHIATRIC: Mood, affect within normal limits. The patient is awake, alert and oriented x 3. Insight, judgment intact.       IMAGING    DG Chest 2 View  Result Date: 01/29/2021 CLINICAL DATA:  Cough, body aches, COVID-19 positive EXAM: CHEST - 2 VIEW COMPARISON:  06/08/2020 CT 10/31/2018 FINDINGS: Frontal and lateral views of the chest demonstrate an unremarkable cardiac silhouette. Stable 1.5 cm left lower lobe pulmonary nodule. The remaining small nodule seen on recent chest CT are not readily apparent by x-ray. No acute airspace disease, effusion, or pneumothorax. Minimal scarring at the left lung base. No acute bony abnormality. IMPRESSION: 1. No acute intrathoracic process. 2. Stable 1.5 cm left lower lobe pulmonary nodule, unchanged since 2020. The remaining pulmonary nodule seen on the recent chest CT 08/21/2020 are not readily apparent by x-ray. Electronically Signed   By: Randa Ngo M.D.   On: 01/29/2021 16:47   CT Angio Chest PE W and/or Wo Contrast  Result Date: 01/31/2021 CLINICAL DATA:  Ongoing symptoms of COVID. EXAM: CT ANGIOGRAPHY CHEST WITH CONTRAST TECHNIQUE: Multidetector CT imaging of the chest was performed using the standard protocol during bolus administration of intravenous contrast. Multiplanar CT image reconstructions and  MIPs were obtained to evaluate the vascular anatomy. CONTRAST:  59m OMNIPAQUE IOHEXOL 350 MG/ML SOLN COMPARISON:  August 21, 2020 FINDINGS: Cardiovascular: There is moderate severity calcification of the aortic arch, without evidence of aortic aneurysm or dissection. Satisfactory opacification of the pulmonary arteries to the segmental level. No evidence of pulmonary embolism. Normal heart size. No pericardial effusion. Mediastinum/Nodes: No enlarged mediastinal, hilar, or axillary lymph nodes. Thyroid gland, trachea, and esophagus demonstrate no significant findings. Lungs/Pleura: Mild atelectasis is seen within the posterior aspects of the bilateral lung left greater than right. There is no evidence of acute infiltrate, pleural effusion or pneumothorax. Upper Abdomen: There is a small hiatal hernia. A 1.3 cm diameter simple cyst is seen along the anterior aspect of the upper pole of the left kidney. Musculoskeletal: A 4.9 cm x 2.3 cm lipoma is seen along the anterolateral aspect of the lower right chest wall. No acute osseous abnormalities are identified. Review of the MIP images confirms the above findings. IMPRESSION: 1. No CT evidence of pulmonary embolism or acute cardiopulmonary disease. 2. Small hiatal hernia. 3. 4.9 cm x 2.3 cm lower right chest wall lipoma. 4. Aortic atherosclerosis. Aortic Atherosclerosis (ICD10-I70.0). Electronically Signed   By: TVirgina NorfolkM.D.   On: 01/31/2021 20:23    Media Information Document Information  Photos    02/06/2021 13:27  Attached To:  Hospital Encounter on 01/31/21   Source Information  AOttie Glazier MD  [facility] (1c)    ASSESSMENT/PLAN   Chronic COPD with chronic bronchitis -She is on pred 40 bid which should help with inflammatory changes -will swab for non covid RVP virus to see if other infections are overlying -[facility] -will ask if mucomyst could be delivered by RT but unsure if this can be done while on  covid  precautions   Atelectasis of LLL Continue use of IS - currently at 750cc/h -mucinex   GERD with hiatal hernia  Review of CT chest to me looks like there is diverticulum , will order barium swallow to elucidate anatomic lesion and volume of regurgitant flow.  -Phenol spray QID and viscous lidocaine   COVID19 infection    - can check cycle threshold value with lab to see if its active if >35 then inactive old infection     - CRP/ferritin/ESR/MRSA pcr/ Procalcitonin  -continue current mgt     Thank you for allowing me to participate in the care of this patient.  Total face to face encounter time for this patient visit was >66mn. >50% of the time was  spent in counseling and coordination of care.   Patient/Family are satisfied with care plan and all questions have been answered.  This document was prepared using Dragon voice recognition software and may include unintentional dictation errors.     FOttie Glazier M.D.  [place]

## 2021-02-08 NOTE — Progress Notes (Signed)
Initial Nutrition Assessment  DOCUMENTATION CODES:  Not applicable  INTERVENTION:  Continue current diet as ordered per SLP, advance as tolerated Nepro Shake po TID, each supplement provides 425 kcal and 19 grams protein MVI with minerals daily Magic cup TID with meals, each supplement provides 290 kcal and 9 grams of protein  NUTRITION DIAGNOSIS:  Inadequate oral intake related to dysphagia as evidenced by per patient/family report.  GOAL:  Patient will meet greater than or equal to 90% of their needs  MONITOR:  PO intake, Supplement acceptance  REASON FOR ASSESSMENT:  Consult Assessment of nutrition requirement/status  ASSESSMENT:  65 y.o. female with hx of breast cancer (in remission), vocal cord cancer, emphysema, vitamin b12 and d deficiency, and GERD presented to ED for worsening COVID19 symptoms. Recent discharge from ED for similar symptoms.   Pt resting in bed at the time of visit. Recently returned from [facility]. Diet downgraded to DYS 1 with nectar thick liquids. Pt reports that she has not been eating well in sometime due to her swallowing issues. Has felt weaker the last few weeks and has had a 3.7% weight loss noted in the last 3 months (not severe). Discussed her diet order and supplements that could be utilized to increase nutrition intake. Agreeable to nepro, would like to try strawberry flavor, but ultimately prefers chocolate.   Good intake of meals documented this admission.   Average Meal Intake: 10/5-10/10: 98% intake x 4 recorded meals  Nutritionally Relevant Medications: Scheduled Meds:  atorvastatin  40 mg Oral Daily   insulin aspart  0-9 Units Subcutaneous TID WC   pantoprazole (PROTONIX) IV  40 mg Intravenous Q12H   predniSONE  20 mg Oral BID WC   PRN Meds: alum & mag hydroxide-simeth  Labs Reviewed: SBG ranges from 123-249 mg/dL over the last 24 hours HgbA1c 6.4%  NUTRITION - FOCUSED PHYSICAL EXAM: Flowsheet Row Most Recent Value  Orbital Region  No depletion  Upper Arm Region No depletion  Thoracic and Lumbar Region No depletion  Buccal Region No depletion  Temple Region No depletion  Clavicle Bone Region No depletion  Clavicle and Acromion Bone Region No depletion  Scapular Bone Region No depletion  Dorsal Hand No depletion  Patellar Region Mild depletion  Anterior Thigh Region Mild depletion  Posterior Calf Region Mild depletion  Edema (RD Assessment) None  Hair Reviewed  Eyes Reviewed  Mouth Reviewed  Skin Reviewed  Nails Reviewed   Diet Order:   Diet Order             DIET - DYS 1 Room service appropriate? Yes with Assist; Fluid consistency: Nectar Thick  Diet effective now                   EDUCATION NEEDS:  No education needs have been identified at this time  Skin:  Skin Assessment: Reviewed RN Assessment  Last BM:  10/6  Height:  Ht Readings from Last 1 Encounters:  01/31/21 5\' 1"  (1.549 m)   Weight:  Wt Readings from Last 1 Encounters:  01/31/21 62 kg   Ideal Body Weight:  47.7 kg  BMI:  Body mass index is 25.83 kg/m.  Estimated Nutritional Needs:  Kcal:  1600-1800 kcal/d Protein:  80-90g/d Fluid:  1.7-1.9 L/d  Ranell Patrick, RD, LDN Clinical Dietitian RD pager # available in [place]  After hours/weekend pager # available in [facility]

## 2021-02-09 DIAGNOSIS — R0602 Shortness of breath: Secondary | ICD-10-CM | POA: Diagnosis not present

## 2021-02-09 DIAGNOSIS — R1319 Other dysphagia: Secondary | ICD-10-CM

## 2021-02-09 DIAGNOSIS — K222 Esophageal obstruction: Secondary | ICD-10-CM

## 2021-02-09 LAB — GLUCOSE, CAPILLARY
Glucose-Capillary: 80 mg/dL (ref 70–99)
Glucose-Capillary: 125 mg/dL — ABNORMAL HIGH (ref 70–99)
Glucose-Capillary: 153 mg/dL — ABNORMAL HIGH (ref 70–99)

## 2021-02-09 MED ORDER — LACTULOSE 10 GM/15ML PO SOLN
30.0000 g | Freq: Every day | ORAL | Status: DC | PRN
  Administered 2021-02-09: 18:00:00 30 g via ORAL
  Filled 2021-02-09 (×2): qty 60

## 2021-02-09 MED ORDER — POLYETHYLENE GLYCOL 3350 17 G PO PACK: 17.0000 g | PACK | Freq: Every day | ORAL | Status: DC | PRN

## 2021-02-09 MED ORDER — PANTOPRAZOLE SODIUM 40 MG PO TBEC
40.0000 mg | DELAYED_RELEASE_TABLET | Freq: Two times a day (BID) | ORAL | Status: DC
  Administered 2021-02-09 – 2021-02-11 (×5): 40 mg via ORAL
  Filled 2021-02-09 (×5): qty 1

## 2021-02-09 MED ORDER — FLUCONAZOLE 100 MG PO TABS
100.0000 mg | ORAL_TABLET | Freq: Every day | ORAL | Status: DC
  Administered 2021-02-09 – 2021-02-11 (×3): 100 mg via ORAL
  Filled 2021-02-09 (×3): qty 1

## 2021-02-09 MED ORDER — SENNOSIDES-DOCUSATE SODIUM 8.6-50 MG PO TABS
1.0000 | ORAL_TABLET | Freq: Two times a day (BID) | ORAL | Status: DC
  Administered 2021-02-09 – 2021-02-11 (×4): 1 via ORAL
  Filled 2021-02-09 (×4): qty 1

## 2021-02-09 MED ORDER — PREDNISONE 20 MG PO TABS
20.0000 mg | ORAL_TABLET | Freq: Every day | ORAL | Status: AC
  Administered 2021-02-10: 08:00:00 20 mg via ORAL
  Filled 2021-02-09: qty 1

## 2021-02-09 NOTE — Consult Note (Signed)
Vonda Antigua, MD [address], [place], [place], [place], [address] [address], [place], [place], [place], [address] Phone: [phone]  Fax: [phone]  Consultation  Referring Provider:     Dr. Thereasa Solo Primary Care Physician:  Natale Milch Vickki Muff, PA-C (Inactive) Reason for Consultation:     Dysphagia  Date of Admission:  01/31/2021 Date of Consultation:  02/09/2021         HPI:   Denise Watts is a 65 y.o. female with history of laryngeal cancer status post radiation therapy in 1998 with residual vocal cord dysfunction, emphysema, breast cancer status postlumpectomy and XRT 2019, with GI being consulted for dysphagia.  Patient reports chronic history of dysphagia for years.  Patient also has dysphonia.  She states the dysphonia has been recent as over the last few days.  Speech pathology evaluated her and has recommended dysphagia diet, however recommended ENT and GI evaluation due to what appeared to be a diverticulum just below the CP segment as per their notes.  With bolus regimen she will retained in the space.  Patient was also evaluated by pulmonology on this admission due to dyspnea and shortness of breath at rest.  She was COVID-positive on 01/27/2021 and is off isolation at this time.  As per pulmonology note patient has chronic COPD with chronic bronchitis and is on prednisone.  Patient was previously seen by Dr. Allen Norris in 2021 and underwent upper endoscopy due to dysphagia which showed benign proximal esophageal stricture that was dilated.  As per his note  "The patient has a history of dysphagia and saw Dr. Candace Cruise back in 2015 and she had a barium swallow that showed:   IMPRESSION:  1. There was 1 episode of mild laryngeal penetration of the barium  which did elicit a cough reflex. The patient reports she experiences  occasional similar episodes at home.  2. Fixed narrowing of the lower cervical esophagus which would not  allow passage of the barium tablet  consistent with a stricture. The  patient reports that she underwent esophageal stretching last week.  However, the lower cervical esophagus remains abnormally narrowed.  Correlation with the endoscopic findings is needed.  3. Once the lower cervical esophageal stricture has been addressed,  consideration could be given to performing a modified barium swallow  examination to assess swallowing function and adequate functioning  of the epiglottis.  4. The thoracic esophagus is unremarkable.    It appears that the patient underwent a upper endoscopy at an offsite location and those records are not available to me at this time."  Past Medical History:  Diagnosis Date  . Anxiety   . Arthritis    KNEE RIGHT  . Breast cancer [facility]) 1997   right breast lumpectomy with radiation  . Breast cancer ([place]) 11/15/2017   INVASIVE MAMMARY CARCINOMA/ triple negative  . 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[place]) 11/10/2014  . Cough 03/02/2017   INTERMTTENT DRY COUGH  . Depression   . GERD (gastroesophageal reflux disease)   . History of hiatal hernia   . Personal history of chemotherapy   . Personal history of radiation therapy   . Skin cancer   . Throat cancer [facility]) 1998   radiation    Past Surgical History:  Procedure Laterality Date  . BREAST BIOPSY Right 1997   positive  . BREAST BIOPSY Right 11/15/2017   INVASIVE MAMMARY CARCINOMA triple negative  . BREAST BIOPSY Right 07/20/2020   [place] bx, Q marker, path pending  .  BREAST LUMPECTOMY Right 1997   2019 also  . BREAST LUMPECTOMY WITH SENTINEL LYMPH NODE BIOPSY Right 12/08/2017   Procedure: BREAST LUMPECTOMY WITH SENTINEL LYMPH NODE BX;  Surgeon: Robert Bellow, MD;  Location: [facility] ORS;  Service: General;  Laterality: Right;  . BREAST REDUCTION WITH MASTOPEXY Left 06/10/2019   Procedure: Left breast mastopexy reduction for symmetry;  Surgeon: Wallace Going, DO;  Location: [place];  Service: Plastics;  Laterality: Left;  total 2.5  hours  . COLONOSCOPY  2015  . COLONOSCOPY WITH PROPOFOL N/A 10/29/2019   Procedure: COLONOSCOPY WITH PROPOFOL;  Surgeon: Lucilla Lame, MD;  Location: [facility] ENDOSCOPY;  Service: Endoscopy;  Laterality: N/A;  . ESOPHAGOGASTRODUODENOSCOPY (EGD) WITH PROPOFOL N/A 05/24/2019   Procedure: ESOPHAGOGASTRODUODENOSCOPY (EGD) WITH PROPOFOL;  Surgeon: Lucilla Lame, MD;  Location: [facility] ENDOSCOPY;  Service: Endoscopy;  Laterality: N/A;  . ESOPHAGUS SURGERY    . KNEE SURGERY Right   . LIPOSUCTION WITH LIPOFILLING Right 06/10/2019   Procedure: right breast scar and capsule release with fat grafting;  Surgeon: Wallace Going, DO;  Location: [place];  Service: Plastics;  Laterality: Right;  . PORT-A-CATH REMOVAL    . PORTACATH PLACEMENT Right 01/17/2018   Procedure: INSERTION PORT-A-CATH- RIGHT;  Surgeon: Robert Bellow, MD;  Location: [facility] ORS;  Service: General;  Laterality: Right;  . ROBOTIC ADRENALECTOMY Right 12/28/2016   Procedure: ROBOTIC ADRENALECTOMY;  Surgeon: Hollice Espy, MD;  Location: [facility] ORS;  Service: Urology;  Laterality: Right;  . SKIN CANCER EXCISION    . THROAT SURGERY  1998   throat cancer   . TUBAL LIGATION    . VENTRAL HERNIA REPAIR N/A 03/03/2017   Procedure: HERNIA REPAIR VENTRAL ADULT;  Surgeon: Clayburn Pert, MD;  Location: [facility] ORS;  Service: General;  Laterality: N/A;    Prior to Admission medications   Medication Sig Start Date End Date Taking? Authorizing Provider  albuterol (VENTOLIN HFA) 108 (90 Base) MCG/ACT inhaler Inhale 2 puffs into the lungs every 6 (six) hours as needed for shortness of breath. 01/29/21  Yes Menshew, Dannielle Karvonen, PA-C  aspirin EC 81 MG tablet Take 1 tablet (81 mg total) by mouth daily. Swallow whole. 10/19/20  Yes Agbor-Etang, Aaron Edelman, MD  atorvastatin (LIPITOR) 40 MG tablet Take 1 tablet (40 mg total) by mouth daily. 12/15/20 03/15/21 Yes Agbor-Etang, Aaron Edelman, MD  benzonatate (TESSALON PERLES) 100 MG capsule Take 1-2 tabs TID  prn cough 01/29/21  Yes Menshew, Dannielle Karvonen, PA-C  brompheniramine-pseudoephedrine-DM 30-2-10 MG/5ML syrup Take 5 mLs by mouth 4 (four) times daily as needed. 01/29/21  Yes Menshew, Dannielle Karvonen, PA-C  Budeson-Glycopyrrol-Formoterol (BREZTRI AEROSPHERE) 160-9-4.8 MCG/ACT AERO Inhale 2 puffs into the lungs in the morning and at bedtime. 08/06/20  Yes Chesley Mires, MD  busPIRone (BUSPAR) 5 MG tablet Take 1 tablet (5 mg total) by mouth 2 (two) times daily. 03/03/20  Yes Chrismon, Vickki Muff, PA-C  cyclobenzaprine (FLEXERIL) 5 MG tablet TAKE 1 TABLET BY MOUTH 3 TIMES DAILY AS NEEDED FOR MUSCLE SPASMS 01/19/21  Yes Birdie Sons, MD  DULoxetine (CYMBALTA) 60 MG capsule Take by mouth. 08/27/20 08/27/21 Yes [provider]  fluticasone (FLONASE) 50 MCG/ACT nasal spray Place 1 spray into both nostrils at bedtime. 06/19/20  Yes Mar Daring, PA-C  gabapentin (NEURONTIN) 300 MG capsule TAKE 1 CAPSULE BY MOUTH 3 TIMES A DAY 11/17/20  Yes Chrismon, Vickki Muff, PA-C  HYDROcodone-acetaminophen (NORCO/VICODIN) 5-325 MG tablet Take 1 tablet by mouth every 4 (four)  hours as needed for moderate pain. 01/12/21  Yes Jerrol Banana., MD  omeprazole (PRILOSEC) 40 MG capsule Take 40 mg by mouth in the morning and at bedtime.   Yes [provider]  ondansetron (ZOFRAN ODT) 4 MG disintegrating tablet Take 1 tablet (4 mg total) by mouth every 8 (eight) hours as needed. 01/29/21  Yes Menshew, Dannielle Karvonen, PA-C  oxybutynin (DITROPAN XL) 15 MG 24 hr tablet Take 1 tablet (15 mg total) by mouth daily. 12/09/20  Yes Hollice Espy, MD  rOPINIRole (REQUIP) 0.25 MG tablet TAKE 1 TO 2 TABLETS BY MOUTH AT BEDTIME FOR RESTLESS LEGS 01/18/21  Yes Chrismon, Simona Huh E, PA-C  ondansetron (ZOFRAN) 4 MG tablet Take 1 tablet (4 mg total) by mouth every 8 (eight) hours as needed for nausea or vomiting. 06/06/19   Scheeler, Carola Rhine, PA-C    Family History  Problem Relation Age of Onset  . Diabetes Mother   . Heart  attack Mother   . Cancer Sister        cancer of eyes, spread to lung, other places. died at 43  . Cancer Sister 71       unknown primary, spread to bone, brain died in 41's  . Cancer Paternal Aunt        unk type  . Cancer Paternal Grandmother        type unk  . Prostate cancer Neg Hx   . Kidney cancer Neg Hx   . Bladder Cancer Neg Hx      Social History   Tobacco Use  . Smoking status: Former    Packs/day: 1.50    Years: 30.00    Pack years: 45.00    Types: Cigarettes    Quit date: 05/03/1995    Years since quitting: 25.7  . Smokeless tobacco: Never  Vaping Use  . Vaping Use: Never used  Substance Use Topics  . Alcohol use: Yes    Alcohol/week: 4.0 standard drinks    Types: 4 Shots of liquor per week    Comment: 2 per night on weekends  . Drug use: No    Allergies as of 01/31/2021 - Review Complete 01/31/2021  Allergen Reaction Noted  . Sulfa antibiotics Other (See Comments) 10/28/2020    Review of Systems:    All systems reviewed and negative except where noted in HPI.   Physical Exam:  Constitutional: General:   Alert,  Well-developed, well-nourished, pleasant and cooperative in NAD BP 118/87 (BP Location: Left Wrist)   Pulse 92   Temp 97.9 F (36.6 C)   Resp 18   Ht 5\' 1"  (1.549 m)   Wt 62 kg   SpO2 94%   BMI 25.83 kg/m   Eyes:  Sclera clear, no icterus.   Conjunctiva pink. PERRLA  Ears:  No scars, lesions or masses, Normal auditory acuity. Nose:  No deformity, discharge, or lesions. Mouth:  No deformity or lesions, oropharynx pink & moist.  Neck:  Supple; no masses or thyromegaly.  Respiratory: Normal respiratory effort, Normal percussion  Gastrointestinal:  Normal bowel sounds.  No bruits.  Soft, non-tender and non-distended without masses, hepatosplenomegaly or hernias noted.  No guarding or rebound tenderness.     Cardiac: No clubbing or edema.  No cyanosis. Normal posterior tibial pedal pulses noted.  Lymphatic:  No significant cervical  or axillary adenopathy.  Psych:  Alert and cooperative. Normal mood and affect.  Musculoskeletal:  Normal gait. Head normocephalic, atraumatic. Symmetrical without gross deformities. 5/5 Upper  and Lower extremity strength bilaterally.  Skin: Warm. Intact without significant lesions or rashes. No jaundice.  Neurologic:  Face symmetrical, tongue midline, Normal sensation to touch; audible voice changes  Psych:  Alert and oriented x3, Alert and cooperative. Normal mood and affect.   LAB RESULTS: Recent Labs    02/07/21 0401  WBC 9.6  HGB 13.2  HCT 40.0  PLT 213   BMET Recent Labs    02/07/21 0401  NA 139  K 4.6  CL 100  CO2 29  GLUCOSE 165*  BUN 19  CREATININE 0.87  CALCIUM 8.9   LFT No results for input(s): PROT, ALBUMIN, AST, ALT, ALKPHOS, BILITOT, BILIDIR, IBILI in the last 72 hours. PT/INR No results for input(s): LABPROT, INR in the last 72 hours.  STUDIES: CT chest October 2022 Thyroid gland, trachea, and esophagus demonstrate no significant findings  Barium esophagram December 2015 IMPRESSION:  1. There was 1 episode of mild laryngeal penetration of the barium  which did elicit a cough reflex. The patient reports she experiences  occasional similar episodes at home.  2. Fixed narrowing of the lower cervical esophagus which would not  allow passage of the barium tablet consistent with a stricture. The  patient reports that she underwent esophageal stretching last week.  However, the lower cervical esophagus remains abnormally narrowed.  Correlation with the endoscopic findings is needed.  3. Once the lower cervical esophageal stricture has been addressed,  consideration could be given to performing a modified barium swallow  examination to assess swallowing function and adequate functioning  of the epiglottis.  4. The thoracic esophagus is unremarkable.      Electronically Signed    By: David  Martinique    On: 04/16/2014 09:42      Impression / Plan:    Denise Watts is a 65 y.o. y/o female with history of laryngeal cancer status postradiation years ago, with vocal cord dysfunction, and chronic dysphagia with proximal stricture dilated in January 2021, and barium esophagram in 2015 showing lower cervical esophageal stricture  I discussed the patient with Dr. Thereasa Solo, and my concern is that patient has had a proximal/cervical esophageal stricture demonstrated by esophagram in 2015 and on EGD in 2021.  Patient has continued to have dysphagia issues despite dilation and repeat EGD for evaluation of a proximal stricture:  1. may not be able to visualize examined the stricture as well as a laryngoscopy would due to its location 2. Given its proximal location, dilation may or may not be able to be done easily with an EGD 3.  Would place patient at risks of sedation in the setting of recent shortness of breath, dysphonia, chronic bronchitis   Therefore, given the above I have discussed with Dr. Thereasa Solo that since patient has not been seen by ENT in a long time, ENT evaluation would help at this time.  If they are able to evaluate the proximal esophagus with laryngoscopy and evaluate the possible diverticulum seen during speech pathology evaluation, this would allow for evaluation of the proximal esophagus without need for sedation and therefore, be less risk  However, if ENT determines that they are not able to evaluate this area completely, EGD can be considered for possible evaluation and dilation of the area if needed.  Patient will need to be discussed with anesthesia in that case to see if they feel the patient is safe for sedation.  Patient is currently on room air    Thank you for involving me in  the care of this patient.      LOS: 8 days   Virgel Manifold, MD  02/09/2021, 1:07 PM

## 2021-02-09 NOTE — Care Management Important Message (Signed)
Important Message  Patient Details  Name: Denise Watts MRN: [id number] Date of Birth: [birth year]   Medicare Important Message Given:  Yes (late documentation IM given on 10 09 22)     Caitlynn Ju, Leroy Sea 02/09/2021, 10:46 AM

## 2021-02-09 NOTE — Evaluation (Signed)
Occupational Therapy Evaluation Patient Details Name: Denise Watts MRN: [id number] DOB: 03-10-1956 Today's Date: 02/09/2021   History of Present Illness 65yo F w/ PMHx of laryngeal CA s/p radiation therapy with residual vocal cord dysfunction, COPD/emphysema, severe GERD with hiatal hernia, hx of Breast CA s/p lumpectomy and radiation, MDD, anxiety disorder, OA, chronic dysphagia, came in with complaints of cough and dyspnea and was +for COVID 01/27/21.   Clinical Impression   Denise Watts presents today with generalized weakness, limited endurance, and mild pain. Prior to admission, she was IND in ADL and IADL, ambulating primarily w/o AD, although occasionally using a SPC if she is "feeling off." [place], being active [place], and walking her dog several times each day. She states that she has had 4-5 falls in the previous 12 months. During today's evaluation, she is able to perform bed mobility, transfers, UB and LB dressing, toileting, hygenie, grooming in standing, w/in room ambulation, all INDly and w/o AD. She does become visibly fatigued and SOB with exertion, however. Pt reports she does not anticipate needing OT services post DC. Therapist concurs, given pt's IND in performing fxl mobility tasks. Will drop from OT service at this time.    Recommendations for follow up therapy are one component of a multi-disciplinary discharge planning process, led by the attending physician.  Recommendations may be updated based on patient status, additional functional criteria and insurance authorization.   Follow Up Recommendations  No OT follow up    Equipment Recommendations       Recommendations for Other Services       Precautions / Restrictions Precautions Precaution Comments: mod fall risk Restrictions Weight Bearing Restrictions: No      Mobility Bed Mobility Overal bed mobility: Modified Independent             General bed mobility comments: slightly  increased time    Transfers Overall transfer level: Modified independent Equipment used: None                  Balance Overall balance assessment: Needs assistance   Sitting balance-Leahy Scale: Normal     Standing balance support: No upper extremity supported;During functional activity Standing balance-Leahy Scale: Good                             ADL either performed or assessed with clinical judgement   ADL Overall ADL's : Independent                                             Vision         Perception     Praxis      Pertinent Vitals/Pain Pain Assessment: Faces Faces Pain Scale: Hurts a little bit Pain Location: throat, back Pain Intervention(s): Repositioned;Relaxation     Hand Dominance     Extremity/Trunk Assessment Upper Extremity Assessment Upper Extremity Assessment: Overall WFL for tasks assessed   Lower Extremity Assessment Lower Extremity Assessment: Overall WFL for tasks assessed       Communication Communication Communication: No difficulties   Cognition Arousal/Alertness: Awake/alert Behavior During Therapy: WFL for tasks assessed/performed Overall Cognitive Status: Within Functional Limits for tasks assessed  General Comments       Exercises Other Exercises Other Exercises: Educ re ECS, HEP, home modifications   Shoulder Instructions      Home Living Family/patient expects to be discharged to:: Private residence Living Arrangements: Spouse/significant other Available Help at Discharge: Family;Available PRN/intermittently Type of Home: House Home Access: Stairs to enter [employer] of Steps: 5 at front door, 3 at back door Entrance Stairs-Rails: Can reach both Home Layout: One level     Bathroom Shower/Tub: Teacher, early years/pre: Standard     Home Equipment: Shower seat;Grab bars - tub/shower;Cane - single  point   Additional Comments: 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      Prior Functioning/Environment Level of Independence: Independent                 OT Problem List: Decreased strength;Decreased activity tolerance;Pain      OT Treatment/Interventions:      OT Goals(Current goals can be found in the care plan section) Acute Rehab OT Goals Patient Stated Goal: "to get my energy back" OT Goal Formulation: With patient Time For Goal Achievement: 02/23/21 Potential to Achieve Goals: Good  OT Frequency:     Barriers to D/C:            Co-evaluation              AM-PAC OT "6 Clicks" Daily Activity     Outcome Measure Help from another person eating meals?: None Help from another person taking care of personal grooming?: None Help from another person toileting, which includes using toliet, bedpan, or urinal?: None Help from another person bathing (including washing, rinsing, drying)?: None Help from another person to put on and taking off regular upper body clothing?: None Help from another person to put on and taking off regular lower body clothing?: None 6 Click Score: 24   End of Session    Activity Tolerance: Patient tolerated treatment well Patient left: in chair;with call bell/phone within reach  OT Visit Diagnosis: Muscle weakness (generalized) (M62.81)                Time: 8546-2703 OT Time Calculation (min): 20 min Charges:  OT General Charges $OT Visit: 1 Visit OT Evaluation $OT Eval Low Complexity: 1 Low OT Treatments $Self Care/Home Management : 8-22 mins Josiah Lobo, PhD, MS, OTR/L 02/09/21, 4:11 PM

## 2021-02-09 NOTE — Progress Notes (Signed)
PHARMACIST - PHYSICIAN COMMUNICATION  DR:   Thereasa Solo  CONCERNING: IV to Oral Route Change Policy  RECOMMENDATION: This patient is receiving Pantoprazole by the intravenous route.  Based on criteria approved by the Pharmacy and Therapeutics Committee, the intravenous medication(s) is/are being converted to the equivalent oral dose form(s).   DESCRIPTION: These criteria include: The patient is eating (either orally or via tube) and/or has been taking other orally administered medications for a least 24 hours The patient has no evidence of active gastrointestinal bleeding or impaired GI absorption (gastrectomy, short bowel, patient on TNA or NPO).  If you have questions about this conversion, please contact the Pharmacy Department  []   [phone] )  [facility] [x]   [phone] )  [facility] []   [phone] )  [facility] []   [phone] )  [facility] []   [phone] )  [facility] 02/09/2021 7:45 AM

## 2021-02-09 NOTE — TOC Initial Note (Signed)
Transition of Care [facility]) - Initial/Assessment Note    Patient Details  Name: Denise Watts MRN: [id number] Date of Birth: 09-Dec-1955  [facility] CM/SW Contact:    Shelbie Hutching, RN Phone Number: 02/09/2021, 1:40 PM  Clinical Narrative:                 Patient admitted to the hospital with [place].  Patient has a history of throat cancer and radiation.  COVID isolation is over, RNCM met with patient at the bedside.  Patient is from home with her husband and is independent in ADL's and drives.  PT and OT will work with patient here in the hospital since she has been here for 8 days now.  If therapy recommends home health patient agrees with having it set up.  Speech is currently recommending OP Speech Therapy 3 times per week.  Patient is current with her PCP and uses [facility] for pharmacy.  Expected Discharge Plan: [place] Barriers to Discharge: Continued Medical Work up   Patient Goals and CMS Choice Patient states their goals for this hospitalization and ongoing recovery are:: Patient wants to get better CMS Medicare.gov Compare Post Acute Care list provided to:: Patient Choice offered to / list presented to : Patient  Expected Discharge Plan and Services Expected Discharge Plan: [place]   Discharge Planning Services: CM Consult Post Acute Care Choice: [place] arrangements for the past 2 months: Single Family Home                 DME Arranged: N/A DME Agency: NA                  Prior Living Arrangements/Services Living arrangements for the past 2 months: Single Family Home Lives with:: Spouse Patient language and need for interpreter reviewed:: Yes Do you feel safe going back to the place where you live?: Yes      Need for Family Participation in Patient Care: Yes (Comment) (covid) Care giver support system in place?: Yes (comment) (husband)   Criminal Activity/Legal Involvement Pertinent to  Current Situation/Hospitalization: No - Comment as needed  Activities of Daily Living Home Assistive Devices/Equipment: None ADL Screening (condition at time of admission) Patient's cognitive ability adequate to safely complete daily activities?: Yes Is the patient deaf or have difficulty hearing?: No Does the patient have difficulty seeing, even when wearing glasses/contacts?: No Does the patient have difficulty concentrating, remembering, or making decisions?: Yes Patient able to express need for assistance with ADLs?: Yes Does the patient have difficulty dressing or bathing?: No Independently performs ADLs?: Yes (appropriate for developmental age) Does the patient have difficulty walking or climbing stairs?: No Weakness of Legs: None Weakness of Arms/Hands: None  Permission Sought/Granted Permission sought to share information with : Case Manager, Family Supports, Other (comment) Permission granted to share information with : Yes, Verbal Permission Granted  Share Information with NAME: Makenah Karas  Permission granted to share info w AGENCY: home health agencies  Permission granted to share info w Relationship: husband  Permission granted to share info w Contact Information: [phone]  Emotional Assessment Appearance:: Appears stated age Attitude/Demeanor/Rapport: Engaged Affect (typically observed): Accepting Orientation: : Oriented to Self, Oriented to Place, Oriented to  Time, Oriented to Situation Alcohol / Substance Use: Not Applicable Psych Involvement: No (comment)  Admission diagnosis:  Acute respiratory failure with hypoxia ([place]) [J96.01] Urinary tract infection without hematuria, site unspecified [N39.0] COVID-19 [U07.1] Patient  Active Problem List   Diagnosis Date Noted   Acute hypoxemic respiratory failure due to COVID-19 ([place]) 02/01/2021   SOB (shortness of breath) 02/01/2021   Acute respiratory failure with hypoxia ([place]) 02/01/2021   Centrilobular emphysema  ([place]) 12/08/2020   Situational stress 10/15/2020   Anxiety and depression 10/15/2020   Chronic cough 08/31/2020   Encounter for screening colonoscopy    Polyp of transverse colon    Dysphagia    Stricture and stenosis of esophagus    Cervical spondylosis 03/18/2019   [place]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   Need for prophylactic vaccination and inoculation against influenza 01/25/2019   Postoperative breast asymmetry 01/04/2019   Status post partial mastectomy 01/04/2019   Vitamin D deficiency 12/12/2018   Bursitis of left shoulder 11/21/2018   Rotator cuff tendinitis, left 11/21/2018   Genetic testing 02/01/2018   Skin cancer 01/18/2018   Goals of care, counseling/discussion 01/12/2018   B12 deficiency 12/28/2017   Malignant neoplasm of lower-outer quadrant of right breast of female, estrogen receptor negative ([place]) 11/24/2017   Chronic abdominal pain (Primary Area of Pain)  04/03/2017   Chronic low back pain (Secondary Area of Pain) (B) (R>L) 04/03/2017   Chronic knee pain (Tertiary Area of Pain) (R) 04/03/2017   Chronic pain syndrome 04/03/2017   Disorder of bone, unspecified 04/03/2017   Other specified health status 04/03/2017   Other long term (current) drug therapy 04/03/2017   Long term current use of opiate analgesic 04/03/2017   Chronic sacroiliac joint pain 04/03/2017   S/P repair of ventral hernia 03/03/2017   Incisional hernia, without obstruction or gangrene 02/13/2017   Right adrenal mass ([place]) 12/28/2016   RIGHT Adrenal Mass 12/16/2016   Fatigue 02/17/2016   Other fatigue 02/17/2016   Multiple lung nodules on CT 02/17/2016   History of right breast cancer 02/17/2016   Peripheral neuropathy 02/17/2016   Closed fracture of a rib 02/02/2015   History of fundoplication 99/83/3825   History of throat cancer 01/15/2015   Acid reflux 01/15/2015   Gonalgia 01/15/2015   Closed fracture of pubis ([place]) 01/15/2015   Pelvic fracture ([place]) 01/12/2015   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 [place] [address] Phone: [phone] Fax: [phone]     Social Determinants of Health (SDOH) Interventions    Readmission Risk Interventions No flowsheet data found.

## 2021-02-09 NOTE — Progress Notes (Addendum)
Denise Watts  YOV:[id number] DOB: 1955-06-20 DOA: 01/31/2021 PCP: Margo Common, PA-C (Inactive)    Brief Narrative:  [phone] w/ a hx of laryngeal cancer status post radiation therapy 1998 with residual VCD, emphysema (45 pack year hx/stopped 1997), breast cancer status post lumpectomy and XRT 2019, anxiety, dysphagia, B12 and vitamin D deficiencies, right adrenal mass, tobacco abuse, and dyslipidemia who was seen in the ED with complaints of shortness of breath.  Patient also had COVID symptoms with cough & congestion for about 10 days and was diagnosed with COVID on multiple home tests, as well as in the ED on 01/29/2021..  Date of Positive COVID Test:  01/29/21 (last day of isolation 02/08/21) - NOW OFF ISOLATION  COVID-19 specific Treatment: Remdesivir 10/3 > 10/5  Consultants:  Pulmonary GI  Code Status: FULL CODE  Antimicrobials:  Azithromycin 10/3 > 10/4 Rocephin 10/3 > 10/5  DVT prophylaxis: Lovenox  Subjective: Afebrile.  Vital signs stable.  No acute events reported overnight.  Reports ongoing dyspnea without significant change.  Reports feeling exceedingly weak on her feet and lightheaded when attempting to ambulate.  Thus far tolerating a modified diet well.  Assessment & Plan:  Acute hypoxic respiratory failure -acute bronchospastic COPD exacerbation - resolved  continue inhaled medications - weaning steroid - no evidence of pneumonia on CT chest and procalcitonin <0.1 - patient quit smoking years ago - Pulmonary has seen in consultation - full RVP negative   Persistent Dyspnea  Appears to be a rather complex multifactorial issue, with contributions to include vocal cord dysfunction, possible stenosis of the larynx, probable esophageal dysmotility, possible esophageal diverticulum, and probable esophageal stricture -dysphagia confirmed by MBS 10/10 with diet now modified - will need ongoing outpatient SLP therapy - due to her dysphagia cannot complete a formal barium  esophagram - GI has evaluated and given the proximal nature of her probable esophageal lesions feels that she would be better suited for evaluation via ENT/laryngoscopy as opposed to EGD - I have requested an ENT consult for possible laryngoscopy and follow-up given her prior history of vocal cord polyps and cancer  Oropharyngeal thrush Initiate Diflucan therapy  COVID - incidental  Suspect this was simply an incidental finding - stopped Remdesivir - no specific therapy was indicated for COVID - no parenchymal disease noted on CT scan chest -inflammatory markers not suggestive of an acute phase of this infection  Anxiety and depression Continue usual BuSpar and Cymbalta - perhaps this is contributing to her sx as well but is not felt to be the primary issue  History of B12 deficiency Review of records notes patient has a history of a severe B12 deficiency - B12 565 during this admission  Hyperglycemia No prior history of DM -likely simply due to steroids -A1c 6.4 therefore not yet meeting criteria for diabetes -discussed prediabetic state with patient - wean steroids quickly   E. coli UTI v/s asymptomatic bacteruria  Multiple prior urine cultures in this patient have revealed the same organism -the patient does not presently have dysuria or other symptoms that I feel strongly suggest an active UTI -I suspect her urinary tract is simply colonized with this bacteria -monitor without specific directed antibiotic therapy  GERD Continue usual PPI - Pulmonary suspects possible esophageal diverticulum - see above regarding GI eval   Family Communication: Spoke with husband at bedside Status is: Inpatient  Remains inpatient appropriate because:Inpatient level of care appropriate due to severity of illness  Dispo: The patient is from: Home  Anticipated d/c is to: Home              Patient currently is not medically stable to d/c.   Difficult to place patient  No   Objective: Blood pressure (!) 156/95, pulse 77, temperature 97.7 F (36.5 C), resp. rate 18, height 5\' 1"  (1.549 m), weight 62 kg, SpO2 97 %.  Intake/Output Summary (Last 24 hours) at 02/09/2021 0902 Last data filed at 02/08/2021 2030 Gross per 24 hour  Intake 118 ml  Output --  Net 118 ml    Filed Weights   01/31/21 1903  Weight: 62 kg    Examination: General: Not in extremis -remains dyspneic at rest -otherwise stable Lungs: Distant breath sounds with poor air movement th/o all fields, no expiratory wheezing today, no focal crackles  Cardiovascular: RRR w/o M or rub  Abdomen: NT/ND, soft, bowel sounds positive, no rebound, no ascites Extremities: No C/C/E B LE  CBC: Recent Labs  Lab 02/07/21 0401  WBC 9.6  HGB 13.2  HCT 40.0  MCV 98.0  PLT 382    Basic Metabolic Panel: Recent Labs  Lab 02/07/21 0401  NA 139  K 4.6  CL 100  CO2 29  GLUCOSE 165*  BUN 19  CREATININE 0.87  CALCIUM 8.9    GFR: Estimated Creatinine Clearance: 53 mL/min (by C-G formula based on SCr of 0.87 mg/dL).   Recent Results (from the past 240 hour(s))  Urine Culture     Status: Abnormal   Collection Time: 01/31/21 11:41 PM   Specimen: Urine, Random  Result Value Ref Range Status   Specimen Description   Final    URINE, RANDOM Performed at [facility], [address] [place], [place] [address]    Special Requests   Final    NONE Performed at [facility], [address] [place], [place] [address]    Culture >=100,000 COLONIES/mL ESCHERICHIA COLI (A)  Final   Report Status 02/02/2021 FINAL  Final   Organism ID, Bacteria ESCHERICHIA COLI (A)  Final      Susceptibility   Escherichia coli - MIC*    AMPICILLIN <=2 SENSITIVE Sensitive     CEFAZOLIN <=4 SENSITIVE Sensitive     CEFEPIME <=0.12 SENSITIVE Sensitive     CEFTRIAXONE <=0.25 SENSITIVE Sensitive     CIPROFLOXACIN <=0.25 SENSITIVE Sensitive     GENTAMICIN <=1 SENSITIVE Sensitive     IMIPENEM  <=0.25 SENSITIVE Sensitive     NITROFURANTOIN <=16 SENSITIVE Sensitive     TRIMETH/SULFA <=20 SENSITIVE Sensitive     AMPICILLIN/SULBACTAM <=2 SENSITIVE Sensitive     PIP/TAZO <=4 SENSITIVE Sensitive     * >=100,000 COLONIES/mL ESCHERICHIA COLI  Blood culture (routine x 2)     Status: None   Collection Time: 02/01/21 12:56 AM   Specimen: BLOOD  Result Value Ref Range Status   Specimen Description BLOOD LEFT ANTECUBITAL  Final   Special Requests   Final    BOTTLES DRAWN AEROBIC AND ANAEROBIC Blood Culture adequate volume   Culture   Final    NO GROWTH 5 DAYS Performed at [facility], [address] [place], [place] [address]    Report Status 02/06/2021 FINAL  Final  Culture, blood (Routine X 2) w Reflex to ID Panel     Status: None   Collection Time: 02/01/21  1:42 AM   Specimen: BLOOD  Result Value Ref Range Status   Specimen Description BLOOD LEFT FOREARM  Final   Special Requests  Final    BOTTLES DRAWN AEROBIC AND ANAEROBIC Blood Culture adequate volume   Culture   Final    NO GROWTH 5 DAYS Performed at [facility], [address] [place], [place] [address]    Report Status 02/06/2021 FINAL  Final  Respiratory (~20 pathogens) panel by PCR     Status: None   Collection Time: 02/06/21  1:40 PM   Specimen: Nasopharyngeal Swab; Respiratory  Result Value Ref Range Status   Adenovirus NOT DETECTED NOT DETECTED Final   Coronavirus 229E NOT DETECTED NOT DETECTED Final    Comment: (NOTE) The Coronavirus on the Respiratory Panel, DOES NOT test for the novel  Coronavirus (2019 nCoV)    Coronavirus HKU1 NOT DETECTED NOT DETECTED Final   Coronavirus NL63 NOT DETECTED NOT DETECTED Final   Coronavirus OC43 NOT DETECTED NOT DETECTED Final   Metapneumovirus NOT DETECTED NOT DETECTED Final   Rhinovirus / Enterovirus NOT DETECTED NOT DETECTED Final   Influenza A NOT DETECTED NOT DETECTED Final   Influenza B NOT DETECTED NOT DETECTED Final   Parainfluenza Virus  1 NOT DETECTED NOT DETECTED Final   Parainfluenza Virus 2 NOT DETECTED NOT DETECTED Final   Parainfluenza Virus 3 NOT DETECTED NOT DETECTED Final   Parainfluenza Virus 4 NOT DETECTED NOT DETECTED Final   Respiratory Syncytial Virus NOT DETECTED NOT DETECTED Final   Bordetella pertussis NOT DETECTED NOT DETECTED Final   Bordetella Parapertussis NOT DETECTED NOT DETECTED Final   Chlamydophila pneumoniae NOT DETECTED NOT DETECTED Final   Mycoplasma pneumoniae NOT DETECTED NOT DETECTED Final    Comment: Performed at [facility], [place]. [address], [place], [place] [address]  MRSA Next Gen by PCR, Nasal     Status: None   Collection Time: 02/06/21  2:50 PM   Specimen: Nasal Mucosa; Nasal Swab  Result Value Ref Range Status   MRSA by PCR Next Gen NOT DETECTED NOT DETECTED Final    Comment: (NOTE) The GeneXpert MRSA Assay (FDA approved for NASAL specimens only), is one component of a comprehensive MRSA colonization surveillance program. It is not intended to diagnose MRSA infection nor to guide or monitor treatment for MRSA infections. Test performance is not FDA approved in patients less than 43 years old. Performed at [facility], [address] [place], [place] [address]       Scheduled Meds:  aspirin EC  81 mg Oral Daily   atorvastatin  40 mg Oral Daily   busPIRone  5 mg Oral BID   DULoxetine  60 mg Oral Daily   enoxaparin (LOVENOX) injection  40 mg Subcutaneous Q24H   feeding supplement (NEPRO CARB STEADY)  237 mL Oral TID WC   fluticasone  1 spray Each Nare QHS   gabapentin  300 mg Oral TID   insulin aspart  0-9 Units Subcutaneous TID WC   Ipratropium-Albuterol  1 puff Inhalation Q6H   multivitamin with minerals  1 tablet Oral Daily   nystatin  5 mL Oral QID   oxybutynin  15 mg Oral Daily   pantoprazole  40 mg Oral BID   predniSONE  20 mg Oral BID WC   rOPINIRole  0.25 mg Oral QHS   zolpidem  5 mg Oral QHS      LOS: 8 days   [name],  MD [facility] Office  [phone] Pager - Text Page per Amion  If 7PM-7AM, please contact night-coverage per Amion 02/09/2021, 9:02 AM

## 2021-02-09 NOTE — Progress Notes (Signed)
Pulmonology         Date: 02/09/2021,   MRN# [id number] Denise Watts [birth year]     AdmissionWeight: 62 kg                 CurrentWeight: 38 kg   Referring physician: Dr Thereasa Solo   CHIEF COMPLAINT:   Persistent dyspnea and SOB at rest   HISTORY OF PRESENT ILLNESS AND SUBJECTIVE FINDINGS    65yo F whx of laryngeal CA s/p radiation therapy with residual VCD, COPD/emphysema, severe GERD with hiatal hernia, hx of Breast CA s/p lumpectomy and radiation, MDD, anxiety disorder OA came in with complaints of cough and dyspnea and was +for COVID 01/27/21 noted to be hypoxemic in ER on admission with mild leukocytosis. She had CTPE done which I reviewed independently, findings include bronchitic changes bilaterally with LLL platelike atelectasis worse posteriorly as well as generous PA diameter with DG:UYQIH index >1 suggestive of PH. Pictorial documentation included in imaging section below.  Patient able to take IS tidal volume to 750cc non labored but coughs with deep inspiration. She reports feeling inspissated phelgm in chest but too weak to expectorate debris. PCCM consult for further eval and mgt.   02/07/21- Patient has not had RVP done yet.  She was unable to get barium swallow done due to only being available on weekdays and this is scheduled for Monday.  Husband was at bedside we reviewed medical plan. She continues to use IS and flutter valve for BPH.  Lab work with CRP borderline elevated. Phenol spray and viscous lidocaine added for throat discomfort due to Larygopharyngeal reflux.   02/08/21- patient reports improvement.  She still is not back to baseline reports dyspnea with showering. I have reordered barium swallow today as requested by radiology.   02/09/21- patient with no overnight events. S/p SLP with moderate to severe aspiration risk per Speech pathology.  Would continue per theyre recommendations. No additional findings from pulmonary besides transient atelectasis  as cause of SOB/DOE.  Aspiration pneumonitis remains a risk. May follow up on outpatient.   PAST MEDICAL HISTORY   Past Medical History:  Diagnosis Date  . Anxiety   . Arthritis    KNEE RIGHT  . Breast cancer [facility]) 1997   right breast lumpectomy with radiation  . Breast cancer ([place]) 11/15/2017   INVASIVE MAMMARY CARCINOMA/ triple negative  . 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 Cough 03/02/2017   INTERMTTENT DRY COUGH  . Depression   . GERD (gastroesophageal reflux disease)   . History of hiatal hernia   . Personal history of chemotherapy   . Personal history of radiation therapy   . Skin cancer   . Throat cancer ([place]) 1998   radiation     SURGICAL HISTORY   Past Surgical History:  Procedure Laterality Date  . BREAST BIOPSY Right 1997   positive  . BREAST BIOPSY Right 11/15/2017   INVASIVE MAMMARY CARCINOMA triple negative  . BREAST BIOPSY Right 07/20/2020   [place] bx, Q marker, path pending  . BREAST LUMPECTOMY Right 1997   2019 also  . BREAST LUMPECTOMY WITH SENTINEL LYMPH NODE BIOPSY Right 12/08/2017   Procedure: BREAST LUMPECTOMY WITH SENTINEL LYMPH NODE BX;  Surgeon: Robert Bellow, MD;  Location: [facility];  Service: General;  Laterality: Right;  . BREAST REDUCTION WITH MASTOPEXY Left 06/10/2019   Procedure: Left breast mastopexy reduction for symmetry;  Surgeon: Wallace Going, DO;  Location: [place];  Service:  Plastics;  Laterality: Left;  total 2.5 hours  . COLONOSCOPY  2015  . COLONOSCOPY WITH PROPOFOL N/A 10/29/2019   Procedure: COLONOSCOPY WITH PROPOFOL;  Surgeon: Lucilla Lame, MD;  Location: [facility] ENDOSCOPY;  Service: Endoscopy;  Laterality: N/A;  . ESOPHAGOGASTRODUODENOSCOPY (EGD) WITH PROPOFOL N/A 05/24/2019   Procedure: ESOPHAGOGASTRODUODENOSCOPY (EGD) WITH PROPOFOL;  Surgeon: Lucilla Lame, MD;  [facility] ENDOSCOPY;  Service: Endoscopy;  Laterality: N/A;  . ESOPHAGUS SURGERY    . KNEE SURGERY Right   . LIPOSUCTION WITH  LIPOFILLING Right 06/10/2019   Procedure: right breast scar and capsule release with fat grafting;  Surgeon: Wallace Going, DO;  Location: [place];  Service: Plastics;  Laterality: Right;  . PORT-A-CATH REMOVAL    . PORTACATH PLACEMENT Right 01/17/2018   Procedure: INSERTION PORT-A-CATH- RIGHT;  Surgeon: Robert Bellow, MD;  Location: [facility];  Service: General;  Laterality: Right;  . ROBOTIC ADRENALECTOMY Right 12/28/2016   Procedure: ROBOTIC ADRENALECTOMY;  Surgeon: Hollice Espy, MD;  Location: [facility];  Service: Urology;  Laterality: Right;  . SKIN CANCER EXCISION    . THROAT SURGERY  1998   throat cancer   . TUBAL LIGATION    . VENTRAL HERNIA REPAIR N/A 03/03/2017   Procedure: HERNIA REPAIR VENTRAL ADULT;  Surgeon: Clayburn Pert, MD;  Location: [facility];  Service: General;  Laterality: N/A;     FAMILY HISTORY   Family History  Problem Relation Age of Onset  . Diabetes Mother   . Heart attack Mother   . Cancer Sister        cancer of eyes, spread to lung, other places. died at 6  . Cancer Sister 52       unknown primary, spread to bone, brain died in 46's  . Cancer Paternal Aunt        unk type  . Cancer Paternal Grandmother        type unk  . Prostate cancer Neg Hx   . Kidney cancer Neg Hx   . Bladder Cancer Neg Hx      SOCIAL HISTORY   Social History   Tobacco Use  . Smoking status: Former    Packs/day: 1.50    Years: 30.00    Pack years: 45.00    Types: Cigarettes    Quit date: 05/03/1995    Years since quitting: 25.7  . Smokeless tobacco: Never  Vaping Use  . Vaping Use: Never used  Substance Use Topics  . Alcohol use: Yes    Alcohol/week: 4.0 standard drinks    Types: 4 Shots of liquor per week    Comment: 2 per night on weekends  . Drug use: No     MEDICATIONS    Home Medication:    Current Medication:  Current Facility-Administered Medications:  .  albuterol (VENTOLIN HFA) 108 (90 Base) MCG/ACT inhaler 2  puff, 2 puff, Inhalation, Q4H PRN, [name], MD .  alum & mag hydroxide-simeth (MAALOX/MYLANTA) 200-200-20 MG/5ML suspension 30 mL, 30 mL, Oral, Q4H PRN, [name], MD .  aspirin EC tablet 81 mg, 81 mg, Oral, Daily, Florina Ou V, MD, 81 mg at 02/09/21 0752 .  atorvastatin (LIPITOR) tablet 40 mg, 40 mg, Oral, Daily, Florina Ou V, MD, 40 mg at 02/09/21 0750 .  busPIRone (BUSPAR) tablet 5 mg, 5 mg, Oral, BID, Para Skeans, MD, 5 mg at 02/09/21 0751 .  cyclobenzaprine (FLEXERIL) tablet 5 mg, 5 mg, Oral, TID PRN, Nolberto Hanlon, MD, 5 mg at  02/05/21 2103 .  DULoxetine (CYMBALTA) DR capsule 60 mg, 60 mg, Oral, Daily, Para Skeans, MD, 60 mg at 02/09/21 0749 .  enoxaparin (LOVENOX) injection 40 mg, 40 mg, Subcutaneous, Q24H, [name], MD, 40 mg at 02/08/21 2042 .  feeding supplement (NEPRO CARB STEADY) liquid 237 mL, 237 mL, Oral, TID WC, [name], MD, 237 mL at 02/09/21 1210 .  fluticasone (FLONASE) 50 MCG/ACT nasal spray 1 spray, 1 spray, Each Nare, QHS, Para Skeans, MD, 1 spray at 02/08/21 2043 .  gabapentin (NEURONTIN) capsule 300 mg, 300 mg, Oral, TID, Para Skeans, MD, 300 mg at 02/09/21 0750 .  guaiFENesin-dextromethorphan (ROBITUSSIN DM) 100-10 MG/5ML syrup 5-10 mL, 5-10 mL, Oral, Q4H PRN, Nolberto Hanlon, MD, 10 mL at 02/08/21 2042 .  HYDROcodone-acetaminophen (NORCO/VICODIN) 5-325 MG per tablet 1 tablet, 1 tablet, Oral, Q4H PRN, Para Skeans, MD, 1 tablet at 02/09/21 0749 .  insulin aspart (novoLOG) injection 0-9 Units, 0-9 Units, Subcutaneous, TID WC, [name], MD, 1 Units at 02/09/21 1209 .  Ipratropium-Albuterol (COMBIVENT) respimat 1 puff, 1 puff, Inhalation, Q6H, [name], MD, 1 puff at 02/09/21 0754 .  [COMPLETED] alum & mag hydroxide-simeth (MAALOX/MYLANTA) 200-200-20 MG/5ML suspension 30 mL, 30 mL, Oral, Once, 30 mL at 02/07/21 1521 **AND** lidocaine (XYLOCAINE) 2 % viscous mouth solution 15 mL, 15 mL, Oral, Q3H PRN, Ottie Glazier, MD, 15 mL at 02/08/21 2101 .  multivitamin with minerals tablet 1 tablet, 1 tablet, Oral, Daily, [name], MD, 1 tablet at 02/09/21 0750 .  nystatin (MYCOSTATIN) 100000 UNIT/ML suspension 500,000 Units, 5 mL, Oral, QID, Sharion Settler, NP, 500,000 Units at 02/09/21 0753 .  oxybutynin (DITROPAN-XL) 24 hr tablet 15 mg, 15 mg, Oral, Daily, Florina Ou V, MD, 15 mg at 02/09/21 0751 .  pantoprazole (PROTONIX) EC tablet 40 mg, 40 mg, Oral, BID, [name], MD, 40 mg at 02/09/21 0911 .  [START ON 02/10/2021] predniSONE (DELTASONE) tablet 20 mg, 20 mg, Oral, Q breakfast, Joette Catching T, MD .  rOPINIRole (REQUIP) tablet 0.25 mg, 0.25 mg, Oral, QHS, Amery, Sahar, MD, 0.25 mg at 02/08/21 2042 .  zolpidem (AMBIEN) tablet 5 mg, 5 mg, Oral, QHS, [name], MD, 5 mg at 02/08/21 2042  Facility-Administered Medications Ordered in Other Encounters:  .  cyanocobalamin ((VITAMIN B-12)) injection 1,000 mcg, 1,000 mcg, Intramuscular, Q30 days, Sindy Guadeloupe, MD, 1,000 mcg at 09/28/18 1013 .  cyanocobalamin ((VITAMIN B-12)) injection 1,000 mcg, 1,000 mcg, Intramuscular, Q30 days, Sindy Guadeloupe, MD, 1,000 mcg at 11/30/18 1355 .  cyanocobalamin ((VITAMIN B-12)) injection 1,000 mcg, 1,000 mcg, Intramuscular, Q30 days, Sindy Guadeloupe, MD, 1,000 mcg at 08/16/19 1144    ALLERGIES   Sulfa antibiotics     REVIEW OF SYSTEMS    Review of Systems:  Gen:  Denies  fever, sweats, chills weigh loss  HEENT: Denies blurred vision, double vision, ear pain, eye pain, hearing loss, nose bleeds, sore throat Cardiac:  No dizziness, chest pain or heaviness, chest tightness,edema Resp:   Denies cough or sputum porduction, shortness of breath,wheezing, hemoptysis,  Gi: Denies swallowing difficulty, stomach pain, nausea or vomiting, diarrhea, constipation, bowel incontinence Gu:  Denies bladder incontinence, burning urine Ext:   Denies Joint pain, stiffness or swelling Skin: Denies  skin  rash, easy bruising or bleeding or hives Endoc:  Denies polyuria, polydipsia , polyphagia or weight change Psych:   Denies depression, insomnia or hallucinations   Other:  All other systems  negative   VS: BP 118/87 (BP Location: Left Wrist)   Pulse 92   Temp 97.9 F (36.6 C)   Resp 18   Ht $R'5\' 1"'fC$  (1.549 m)   Wt 62 kg   SpO2 94%   BMI 25.83 kg/m      PHYSICAL EXAM    GENERAL:NAD, no fevers, chills, no weakness no fatigue HEAD: Normocephalic, atraumatic.  EYES: Pupils equal, round, reactive to light. Extraocular muscles intact. No scleral icterus.  MOUTH: Moist mucosal membrane. Dentition intact. No abscess noted.  EAR, NOSE, THROAT: Clear without exudates. No external lesions.  NECK: Supple. No thyromegaly. No nodules. No JVD.  PULMONARY: Diffuse coarse rhonchi right sided +wheezes CARDIOVASCULAR: S1 and S2. Regular rate and rhythm. No murmurs, rubs, or gallops. No edema. Pedal pulses 2+ bilaterally.  GASTROINTESTINAL: Soft, nontender, nondistended. No masses. Positive bowel sounds. No hepatosplenomegaly.  MUSCULOSKELETAL: No swelling, clubbing, or edema. Range of motion full in all extremities.  NEUROLOGIC: Cranial nerves II through XII are intact. No gross focal neurological deficits. Sensation intact. Reflexes intact.  SKIN: No ulceration, lesions, rashes, or cyanosis. Skin warm and dry. Turgor intact.  PSYCHIATRIC: Mood, affect within normal limits. The patient is awake, alert and oriented x 3. Insight, judgment intact.       IMAGING    DG Chest 2 View  Result Date: 01/29/2021 CLINICAL DATA:  Cough, body aches, COVID-19 positive EXAM: CHEST - 2 VIEW COMPARISON:  06/08/2020 CT 10/31/2018 FINDINGS: Frontal and lateral views of the chest demonstrate an unremarkable cardiac silhouette. Stable 1.5 cm left lower lobe pulmonary nodule. The remaining small nodule seen on recent chest CT are not readily apparent by x-ray. No acute airspace disease, effusion, or pneumothorax.  Minimal scarring at the left lung base. No acute bony abnormality. IMPRESSION: 1. No acute intrathoracic process. 2. Stable 1.5 cm left lower lobe pulmonary nodule, unchanged since 2020. The remaining pulmonary nodule seen on the recent chest CT 08/21/2020 are not readily apparent by x-ray. Electronically Signed   By: Randa Ngo M.D.   On: 01/29/2021 16:47   CT Angio Chest PE W and/or Wo Contrast  Result Date: 01/31/2021 CLINICAL DATA:  Ongoing symptoms of COVID. EXAM: CT ANGIOGRAPHY CHEST WITH CONTRAST TECHNIQUE: Multidetector CT imaging of the chest was performed using the standard protocol during bolus administration of intravenous contrast. Multiplanar CT image reconstructions and MIPs were obtained to evaluate the vascular anatomy. CONTRAST:  96mL OMNIPAQUE IOHEXOL 350 MG/ML SOLN COMPARISON:  August 21, 2020 FINDINGS: Cardiovascular: There is moderate severity calcification of the aortic arch, without evidence of aortic aneurysm or dissection. Satisfactory opacification of the pulmonary arteries to the segmental level. No evidence of pulmonary embolism. Normal heart size. No pericardial effusion. Mediastinum/Nodes: No enlarged mediastinal, hilar, or axillary lymph nodes. Thyroid gland, trachea, and esophagus demonstrate no significant findings. Lungs/Pleura: Mild atelectasis is seen within the posterior aspects of the bilateral lung left greater than right. There is no evidence of acute infiltrate, pleural effusion or pneumothorax. Upper Abdomen: There is a small hiatal hernia. A 1.3 cm diameter simple cyst is seen along the anterior aspect of the upper pole of the left kidney. Musculoskeletal: A 4.9 cm x 2.3 cm lipoma is seen along the anterolateral aspect of the lower right chest wall. No acute osseous abnormalities are identified. Review of the MIP images confirms the above findings. IMPRESSION: 1. No CT evidence of pulmonary embolism or acute cardiopulmonary disease. 2. Small hiatal hernia. 3. 4.9 cm  x 2.3 cm lower right chest  wall lipoma. 4. Aortic atherosclerosis. Aortic Atherosclerosis (ICD10-I70.0). Electronically Signed   By: Virgina Norfolk M.D.   On: 01/31/2021 20:23    Media Information Document Information  Photos    02/06/2021 13:27  Attached To:  Hospital Encounter on 01/31/21   Source Information  Ottie Glazier, MD  [facility] (1c)    ASSESSMENT/PLAN   Chronic COPD with chronic bronchitis -She is on pred 40 bid which should help with inflammatory changes -will swab for non covid RVP virus to see if other infections are overlying -agree with [facility] and Combivent -will ask if mucomyst could be delivered by RT but unsure if this can be done while on covid precautions   Atelectasis of LLL Continue use of IS - currently at 750cc/h -mucinex   GERD with hiatal hernia  Review of CT chest to me looks like there is diverticulum , will order barium swallow to elucidate anatomic lesion and volume of regurgitant flow.  -Phenol spray QID and viscous lidocaine   COVID19 infection    - can check cycle threshold value with lab to see if its active if >35 then inactive old infection     - CRP/ferritin/ESR/MRSA pcr/ Procalcitonin  -continue current mgt     Thank you for allowing me to participate in the care of this patient.  Total face to face encounter time for this patient visit was >58mn. >50% of the time was  spent in counseling and coordination of care.   Patient/Family are satisfied with care plan and all questions have been answered.  This document was prepared using Dragon voice recognition software and may include unintentional dictation errors.     FOttie Glazier M.D.  [facility] [place]

## 2021-02-10 DIAGNOSIS — R0602 Shortness of breath: Secondary | ICD-10-CM

## 2021-02-10 LAB — GLUCOSE, CAPILLARY
Glucose-Capillary: 68 mg/dL — ABNORMAL LOW (ref 70–99)
Glucose-Capillary: 160 mg/dL — ABNORMAL HIGH (ref 70–99)
Glucose-Capillary: 276 mg/dL — ABNORMAL HIGH (ref 70–99)

## 2021-02-10 MED ORDER — PREDNISONE 20 MG PO TABS
20.0000 mg | ORAL_TABLET | Freq: Every day | ORAL | Status: AC
  Administered 2021-02-11: 08:00:00 20 mg via ORAL
  Filled 2021-02-10: qty 1

## 2021-02-10 NOTE — Consult Note (Signed)
Denise Watts, Denise Watts [id number] 1956/02/28 Gwynne Edinger, MD  Reason for Consult: Evaluate the larynx status post laryngeal cancer in 1998 and significant dysphagia  HPI: The patient is a 65 year old white female who had a history of laryngeal cancer in 1998.  She had polyps removed by Dr. Evelene Croon and had evidence of cancer in it.  She was given radiation therapy for cure.  She has had some dysphagia since that time because of scar tissue in her hypopharynx and laryngeal area.  She has had some esophageal dilations done previously, last one being in 2021 by Dr. Allen Norris.  She was admitted to the hospital with worsening dysphagia and shortness of breath secondary to COVID.  That has now subsided but she continues to have dysphagia.  A barium swallow has shown some stricture with possible diverticula just beneath the cricopharyngeus muscle.  She also has had some flash penetration with thin liquids so has been put on a nectar thick liquid diet and pured foods for now.  Assessment is made of her larynx and barium swallow x-rays to give some recommendations.  Allergies:  Allergies  Allergen Reactions   Sulfa Antibiotics Other (See Comments)    Mouth sore and raw    ROS: Review of systems normal other than 12 systems except per HPI.  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 died at 80   Cancer Sister 22       unknown primary, spread to bone, brain died in 59's   Cancer Paternal Aunt        unk type   Cancer Paternal Grandmother        type unk   Prostate cancer Neg Hx    Kidney cancer Neg Hx    Bladder Cancer Neg Hx     SH:  Social History   Socioeconomic History   Marital status: Married    Spouse name: Not on file   Number of children: 4   Years of education: Not on file   Highest education level: 10th grade  Occupational History   Occupation: retired  Tobacco Use   Smoking status: Former    Packs/day: 1.50    Years: 30.00    Pack years: 45.00    Types: Cigarettes    Quit date: 05/03/1995    Years since quitting: 25.7   Smokeless tobacco: Never  Vaping Use   Vaping Use: Never used  Substance and Sexual Activity   Alcohol use: Yes    Alcohol/week: 4.0 standard drinks    Types: 4 Shots of liquor per week    Comment: 2 per night on weekends   Drug use: No  Sexual activity: Not Currently    Birth control/protection: Post-menopausal  Other Topics Concern   Not on file  Social History Narrative   Not on file   Social Determinants of Health   Financial Resource Strain: Low Risk    Difficulty of Paying Living Expenses: Not hard at all  Food Insecurity: No Food Insecurity   Worried About Charity fundraiser in the Last Year: Never true   [place] in the Last Year: Never true  Transportation Needs: No Transportation Needs   Lack of Transportation (Medical): No   Lack of Transportation (Non-Medical): No  Physical Activity: Insufficiently Active   Days of Exercise per Week: 3 days   Minutes of Exercise per Session: 30 min  Stress: Stress Concern Present   Feeling of Stress : Rather much  Social Connections: Moderately Isolated   Frequency of Communication with Friends and Family: More than three times a week   Frequency of Social Gatherings with Friends and Family: More than three times a  week   Attends Religious Services: Never   Marine scientist or Organizations: No   Attends Archivist Meetings: Never   Marital Status: Married  Human resources officer Violence: Not At Risk   Fear of Current or Ex-Partner: No   Emotionally Abused: No   Physically Abused: No   Sexually Abused: No    PSH:  Past Surgical History:  Procedure Laterality Date   BREAST BIOPSY Right 1997   positive   BREAST BIOPSY Right 11/15/2017   INVASIVE MAMMARY CARCINOMA triple negative   BREAST BIOPSY Right 07/20/2020   [place] bx, Q marker, path pending   BREAST LUMPECTOMY Right 1997   2019 also   BREAST LUMPECTOMY WITH SENTINEL LYMPH NODE BIOPSY Right 12/08/2017   Procedure: BREAST LUMPECTOMY WITH SENTINEL LYMPH NODE BX;  Surgeon: Robert Bellow, MD;  Location: [facility];  Service: General;  Laterality: Right;   BREAST REDUCTION WITH MASTOPEXY Left 06/10/2019   Procedure: Left breast mastopexy reduction for symmetry;  Surgeon: Wallace Going, DO;  Location: [facility];  Service: Plastics;  Laterality: Left;  total 2.5 hours   COLONOSCOPY  2015   COLONOSCOPY WITH PROPOFOL N/A 10/29/2019   Procedure: COLONOSCOPY WITH PROPOFOL;  Surgeon: Lucilla Lame, MD;  Location[facility] END[facility]COPY;  Service: Endoscopy;  Laterality: N/A;   ESOPHAGOGASTRODUODENOSCOPY (EGD) WITH PROPOFOL N/A 05/24/2019   Procedure: ESOPHAGOGASTRODUODENOSCOPY (EGD) WITH PROPOFOL;  Surgeon: Lucilla Lame, MD;  Location: [facility] ENDOSCOPY;  Service: Endoscopy;  Laterality: N/A;   ESOPHAGUS SURGERY     KNEE SURGERY Right    LIPOSUCTION WITH LIPOFILLING Right 06/10/2019   Procedure: right breast scar and capsule release with fat grafting;  Surgeon: Wallace Going, DO;  Location: [place];  Service: Plastics;  Laterality: Right;   PORT-A-CATH REMOVAL     PORTACATH PLACEMENT Right 01/17/2018   Procedure: INSERTION PORT-A-CATH- RIGHT;  Surgeon: Robert Bellow, MD;  Location: [facility];  Service:  General;  Laterality: Right;   ROBOTIC ADRENALECTOMY Right 12/28/2016   Procedure: ROBOTIC ADRENALECTOMY;  Surgeon: Hollice Espy, MD;  Location: [facility];  Service: Urology;  Laterality: Right;   SKIN CANCER EXCISION     THROAT SURGERY  1998   throat cancer    TUBAL LIGATION     VENTRAL HERNIA REPAIR N/A 03/03/2017   Procedure: HERNIA REPAIR VENTRAL ADULT;  Surgeon: Clayburn Pert, MD;  Location: [facility];  Service: General;  Laterality: N/A;    Physical  Exam: The patient is awake and alert and very cooperative.  She does not remember a lot of details from previously.  Her oropharynx shows no oral lesions.  The posterior pharynx is clear.  Her neck is negative for any nodes or masses.  There is some firmness around her larynx area secondary to previous radiation.    The flexible laryngoscopy was done and dictated in detail elsewhere.  This shows a normal hypopharynx and larynx with good vocal cord mobility and no sign of any lesions or abnormal anatomy in the larynx and hypopharynx.  She does not appear to be pooling any secretions or food currently even though I interrupted her lunch.  I reviewed her barium swallow x-rays in detail.  The epiglottis has some delayed movement and may not close over completely to allow some dye into the laryngeal inlet.  She seems to do better with the nectar thick liquids and pured.  There is some pooling in the piriform sinuses at times that seems to clear better with a second swallow.  The there is slight dilation of the esophagus just below the cricopharyngeus muscle that at times looks like a diverticula.  You can only see this on a side view because that is where he all the x-rays were done.  This appears posterior but you cannot tell laterality. There appears to be a stricture of the upper esophagus just below this. It appears this may be a dilation of the upper esophagus just above the stricture, rather than a true diverticula. The rest of the esophagus is  not visualized on this exam.   A/P: The patient has previous laryngeal cancer 24 years ago that has been cured with radiation therapy.  This is left scarring around the hypopharynx and larynx that has been given her some limited mobility of the epiglottis and possibly even the larynx with swallowing.  She has had dysphagia for the last 24 years and has been dilated several times to help open up her esophagus.  The strictures of the esophagus that are being dilated can be stretched some and may help but will not solve this long-term and she will likely need to continue to have dilation periodically.  When there is better flow through the upper cervical esophagus then there will be less stretching of the esophagus and less residue in the piriform sinuses as well.  This may allow her to go back to drinking some thin liquids at that time.  She should have continued follow-up with speech therapy to evaluate after esophageal dilation.  I believe this is best done with flexible esophagoscopy that does not require a general anesthesia to perform like rigid esophagoscopy would. She does not need any specific treatment for her larynx as this looks healthy and better than expected postradiation treatment.   Denise Watts 02/10/2021 12:56 PM

## 2021-02-10 NOTE — Plan of Care (Signed)
  Problem: Education: Goal: Knowledge of General Education information will improve Description: Including pain rating scale, medication(s)/side effects and non-pharmacologic comfort measures Outcome: Progressing   Problem: Health Behavior/Discharge Planning: Goal: Ability to manage health-related needs will improve Outcome: Progressing   Problem: Clinical Measurements: Goal: Ability to maintain clinical measurements within normal limits will improve Outcome: Progressing   Problem: Clinical Measurements: Goal: Diagnostic test results will improve Outcome: Progressing   Problem: Clinical Measurements: Goal: Respiratory complications will improve Outcome: Progressing   Problem: Clinical Measurements: Goal: Cardiovascular complication will be avoided Outcome: Progressing   Problem: Activity: Goal: Risk for activity intolerance will decrease Outcome: Progressing   Problem: Nutrition: Goal: Adequate nutrition will be maintained Outcome: Progressing   Problem: Coping: Goal: Level of anxiety will decrease Outcome: Progressing   Problem: Elimination: Goal: Will not experience complications related to bowel motility Outcome: Progressing   Problem: Safety: Goal: Ability to remain free from injury will improve Outcome: Progressing   Problem: Respiratory: Goal: Will maintain a patent airway Outcome: Progressing   Problem: Respiratory: Goal: Complications related to the disease process, condition or treatment will be avoided or minimized Outcome: Progressing

## 2021-02-10 NOTE — Evaluation (Signed)
Physical Therapy Evaluation Patient Details Name: Denise Watts MRN: [id number] DOB: 07/08/1955 Today's Date: 02/10/2021  History of Present Illness  65yo F w/ PMHx of laryngeal CA s/p radiation therapy with residual vocal cord dysfunction, COPD/emphysema, severe GERD with hiatal hernia, hx of Breast CA s/p lumpectomy and radiation, MDD, anxiety disorder, OA, chronic dysphagia, came in with complaints of cough and dyspnea and was +for COVID 01/27/21.   Clinical Impression  Pt admitted with above diagnosis. Pt received upright in bed agreeable to PT services. Pt reports being Indep with all ADL's prior to hospitalization and able to report DME and home layout without difficulty. Pt remains mod-I for bed mobility. Supervision to stand with no AD but endorses dizziness with initial standing requiring a brief return to sitting. Pt educated on LE therex to improve blood flow and circulation to improve HR and BP. SPO2 remains > 97% throughout session. Improvement in symptoms once returning to standing. Pt tolerated ambulating into hallway with intermittent need for hand rail due to caution and return of minor dizziness with a step to pattern with x2 resting breaks along the way. Supervision provided initially but once dizziness reported, minguard provided for safety back to room. Orthostatics assessed:   BP sitting: 133/88 mm Hg  BP standing: 127/99 mm Hg  Deferred attempting stairs due to reports of dizziness. But pt does endorse dizziness has subsided with standing on second attempt during assessment of BP. Based off current level of functional mobility, pt is safe to d/c home. Pt would benefit from [facility] PT to progress balance and ambulation to baseline levels with no AD as pt is very cautious and hesitant with gait currently due to dizziness. Pt currently with functional limitations due to the deficits listed below (see PT Problem List). Pt will benefit from skilled PT to increase their independence and  safety with mobility to allow discharge to the venue listed below.       Recommendations for follow up therapy are one component of a multi-disciplinary discharge planning process, led by the attending physician.  Recommendations may be updated based on patient status, additional functional criteria and insurance authorization.  Follow Up Recommendations Home health PT;Supervision for mobility/OOB    Equipment Recommendations  None recommended by PT    Recommendations for Other Services       Precautions / Restrictions Precautions Precautions: Fall Restrictions Weight Bearing Restrictions: No      Mobility  Bed Mobility Overal bed mobility: Modified Independent                  Transfers Overall transfer level: Modified independent Equipment used: None                Ambulation/Gait Ambulation/Gait assistance: Supervision Gait Distance (Feet): 60 Feet Assistive device: None Gait Pattern/deviations: Step-to pattern     General Gait Details: Cautious gait, intermittent need for Hand on railing in hallway due to minor dizziness.  Stairs            Wheelchair Mobility    Modified Rankin (Stroke Patients Only)       Balance Overall balance assessment: Needs assistance Sitting-balance support: No upper extremity supported;Feet supported Sitting balance-Leahy Scale: Normal     Standing balance support: During functional activity;Single extremity supported Standing balance-Leahy Scale: Fair Standing balance comment: Intermittent need for HHA on railing for balance due to reports of minor dizziness.  Pertinent Vitals/Pain Pain Assessment: Faces Faces Pain Scale: Hurts a little bit Pain Location: low back Pain Intervention(s): Monitored during session    Home Living Family/patient expects to be discharged to:: Private residence Living Arrangements: Spouse/significant other Available Help at Discharge:  Family;Available PRN/intermittently Type of Home: House Home Access: Stairs to enter Entrance Stairs-Rails: Can reach both Entrance Stairs-Number of Steps: 5 at front door, 3 at back door Home Layout: One level Home Equipment: Shower seat;Grab bars - tub/shower;Cane - single point Additional Comments: 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    Prior Function Level of Independence: Independent               Hand Dominance        Extremity/Trunk Assessment   Upper Extremity Assessment Upper Extremity Assessment: Overall WFL for tasks assessed    Lower Extremity Assessment Lower Extremity Assessment: Generalized weakness    Cervical / Trunk Assessment Cervical / Trunk Assessment: Normal  Communication   Communication: No difficulties  Cognition Arousal/Alertness: Awake/alert Behavior During Therapy: WFL for tasks assessed/performed Overall Cognitive Status: Within Functional Limits for tasks assessed                                        General Comments General comments (skin integrity, edema, etc.): SPO2 > 97% throughout session    Exercises Other Exercises Other Exercises: Role of PT in acute setting, D/c recs   Assessment/Plan    PT Assessment Patient needs continued PT services  PT Problem List Decreased strength;Decreased activity tolerance;Decreased balance;Decreased mobility       PT Treatment Interventions DME instruction;Therapeutic exercise;Gait training;Balance training;Stair training;Neuromuscular re-education;Functional mobility training;Therapeutic activities;Patient/family education    PT Goals (Current goals can be found in the Care Plan section)  Acute Rehab PT Goals Patient Stated Goal: to feel better PT Goal Formulation: With patient Time For Goal Achievement: 02/24/21 Potential to Achieve Goals: Good    Frequency Min 2X/week   Barriers to discharge   rickety stairs at home    Co-evaluation                AM-PAC PT "6 Clicks" Mobility  Outcome Measure Help needed turning from your back to your side while in a flat bed without using bedrails?: None Help needed moving from lying on your back to sitting on the side of a flat bed without using bedrails?: None Help needed moving to and from a bed to a chair (including a wheelchair)?: None Help needed standing up from a chair using your arms (e.g., wheelchair or bedside chair)?: A Little Help needed to walk in hospital room?: A Little Help needed climbing 3-5 steps with a railing? : A Lot 6 Click Score: 20    End of Session Equipment Utilized During Treatment: Gait belt Activity Tolerance: Patient tolerated treatment well Patient left: in bed;with call bell/phone within reach Nurse Communication: Mobility status PT Visit Diagnosis: Other abnormalities of gait and mobility (R26.89);Muscle weakness (generalized) (M62.81)    Time: 3559-7416 PT Time Calculation (min) (ACUTE ONLY): 16 min   Charges:   PT Evaluation $PT Eval Low Complexity: 1 Low PT Treatments $Therapeutic Activity: 8-22 mins       Farrell Pantaleo M. Fairly IV, PT, DPT Physical Therapist- [facility]  02/10/2021, 9:31 AM

## 2021-02-10 NOTE — Progress Notes (Signed)
Pulmonology         Date: 02/10/2021,   MRN# [id number] ANTOINETTA BERRONES January 22, 1956     AdmissionWeight: 62 kg                 CurrentWeight: 58 kg   Referring physician: Dr Thereasa Solo   CHIEF COMPLAINT:   Persistent dyspnea and SOB at rest   HISTORY OF PRESENT ILLNESS AND SUBJECTIVE FINDINGS    65yo F whx of laryngeal CA s/p radiation therapy with residual VCD, COPD/emphysema, severe GERD with hiatal hernia, hx of Breast CA s/p lumpectomy and radiation, MDD, anxiety disorder OA came in with complaints of cough and dyspnea and was +for COVID 01/27/21 noted to be hypoxemic in ER on admission with mild leukocytosis. She had CTPE done which I reviewed independently, findings include bronchitic changes bilaterally with LLL platelike atelectasis worse posteriorly as well as generous PA diameter with TU:UEKCM index >1 suggestive of PH. Pictorial documentation included in imaging section below.  Patient able to take IS tidal volume to 750cc non labored but coughs with deep inspiration. She reports feeling inspissated phelgm in chest but too weak to expectorate debris. PCCM consult for further eval and mgt.   02/07/21- Patient has not had RVP done yet.  She was unable to get barium swallow done due to only being available on weekdays and this is scheduled for Monday.  Husband was at bedside we reviewed medical plan. She continues to use IS and flutter valve for BPH.  Lab work with CRP borderline elevated. Phenol spray and viscous lidocaine added for throat discomfort due to Larygopharyngeal reflux.   02/08/21- patient reports improvement.  She still is not back to baseline reports dyspnea with showering. I have reordered barium swallow today as requested by radiology.   02/09/21- patient with no overnight events. S/p SLP with moderate to severe aspiration risk per Speech pathology.  Would continue per theyre recommendations. No additional findings from pulmonary besides transient atelectasis  as cause of SOB/DOE.  Aspiration pneumonitis remains a risk. May follow up on outpatient.   02/10/21- patient is improved reports feeling dizzy with ambulation.  There was no PE on Oct 2 CTPE study, she is post COVID with recurrent  micro aspiration   PAST MEDICAL HISTORY   Past Medical History:  Diagnosis Date  . Anxiety   . Arthritis    KNEE RIGHT  . Breast cancer [facility]) 1997   right breast lumpectomy with radiation  . Breast cancer ([place]) 11/15/2017   INVASIVE MAMMARY CARCINOMA/ triple negative  . 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[place]) 11/10/2014  . Cough 03/02/2017   INTERMTTENT DRY COUGH  . Depression   . GERD (gastroesophageal reflux disease)   . History of hiatal hernia   . Personal history of chemotherapy   . Personal history of radiation therapy   . Skin cancer   . Throat cancer ([place]) 1998   radiation     SURGICAL HISTORY   Past Surgical History:  Procedure Laterality Date  . BREAST BIOPSY Right 1997   positive  . BREAST BIOPSY Right 11/15/2017   INVASIVE MAMMARY CARCINOMA triple negative  . BREAST BIOPSY Right 07/20/2020   [place] bx, Q marker, path pending  . BREAST LUMPECTOMY Right 1997   2019 also  . BREAST LUMPECTOMY WITH SENTINEL LYMPH NODE BIOPSY Right 12/08/2017   Procedure: BREAST LUMPECTOMY WITH SENTINEL LYMPH NODE BX;  Surgeon: Robert Bellow, MD;  Location: [facility]S;  Service: General;  Laterality: Right;  .  BREAST REDUCTION WITH MASTOPEXY Left 06/10/2019   Procedure: Left breast mastopexy reduction for symmetry;  Surgeon: Wallace Going, DO;  Location: [place];  Service: Plastics;  Laterality: Left;  total 2.5 hours  . COLONOSCOPY  2015  . COLONOSCOPY WITH PROPOFOL N/A 10/29/2019   Procedure: COLONOSCOPY WITH PROPOFOL;  Surgeon: Lucilla Lame, MD;  Location: [facility] ENDOSCOPY;  Service: Endoscopy;  Laterality: N/A;  . ESOPHAGOGASTRODUODENOSCOPY (EGD) WITH PROPOFOL N/A 05/24/2019   Procedure: ESOPHAGOGASTRODUODENOSCOPY (EGD) WITH PROPOFOL;  Surgeon:  Lucilla Lame, MD;  Location: [facility] ENDOSCOPY;  Service: Endoscopy;  Laterality: N/A;  . ESOPHAGUS SURGERY    . KNEE SURGERY Right   . LIPOSUCTION WITH LIPOFILLING Right 06/10/2019   Procedure: right breast scar and capsule release with fat grafting;  Surgeon: Wallace Going, DO;  Location: [place];  Service: Plastics;  Laterality: Right;  . PORT-A-CATH REMOVAL    . PORTACATH PLACEMENT Right 01/17/2018   Procedure: INSERTION PORT-A-CATH- RIGHT;  Surgeon: Robert Bellow, MD;  Location: [facility];  Service: General;  Laterality: Right;  . ROBOTIC ADRENALECTOMY Right 12/28/2016   Procedure: ROBOTIC ADRENALECTOMY;  Surgeon: Hollice Espy, MD;  Location: [facility]S;  Service: Urology;  Laterality: Right;  . SKIN CANCER EXCISION    . THROAT SURGERY  1998   throat cancer   . TUBAL LIGATION    . VENTRAL HERNIA REPAIR N/A 03/03/2017   Procedure: HERNIA REPAIR VENTRAL ADULT;  Surgeon: Clayburn Pert, MD;  Location: [facility] ORS;  Service: General;  Laterality: N/A;     FAMILY HISTORY   Family History  Problem Relation Age of Onset  . Diabetes Mother   . Heart attack Mother   . Cancer Sister        cancer of eyes, spread to lung, other places. died at 25  . Cancer Sister 35       unknown primary, spread to bone, brain died in 8's  . Cancer Paternal Aunt        unk type  . Cancer Paternal Grandmother        type unk  . Prostate cancer Neg Hx   . Kidney cancer Neg Hx   . Bladder Cancer Neg Hx      SOCIAL HISTORY   Social History   Tobacco Use  . Smoking status: Former    Packs/day: 1.50    Years: 30.00    Pack years: 45.00    Types: Cigarettes    Quit date: 05/03/1995    Years since quitting: 25.7  . Smokeless tobacco: Never  Vaping Use  . Vaping Use: Never used  Substance Use Topics  . Alcohol use: Yes    Alcohol/week: 4.0 standard drinks    Types: 4 Shots of liquor per week    Comment: 2 per night on weekends  . Drug use: No     MEDICATIONS     Home Medication:    Current Medication:  Current Facility-Administered Medications:  .  albuterol (VENTOLIN HFA) 108 (90 Base) MCG/ACT inhaler 2 puff, 2 puff, Inhalation, Q4H PRN, [name], MD .  alum & mag hydroxide-simeth (MAALOX/MYLANTA) 200-200-20 MG/5ML suspension 30 mL, 30 mL, Oral, Q4H PRN, [name], MD .  aspirin EC tablet 81 mg, 81 mg, Oral, Daily, Florina Ou V, MD, 81 mg at 02/10/21 0939 .  atorvastatin (LIPITOR) tablet 40 mg, 40 mg, Oral, Daily, Para Skeans, MD, 40 mg at 02/10/21 0938 .  busPIRone (BUSPAR) tablet 5 mg, 5 mg,  Oral, BID, Para Skeans, MD, 5 mg at 02/10/21 9030 .  cyclobenzaprine (FLEXERIL) tablet 5 mg, 5 mg, Oral, TID PRN, Nolberto Hanlon, MD, 5 mg at 02/10/21 0834 .  DULoxetine (CYMBALTA) DR capsule 60 mg, 60 mg, Oral, Daily, Para Skeans, MD, 60 mg at 02/10/21 0938 .  enoxaparin (LOVENOX) injection 40 mg, 40 mg, Subcutaneous, Q24H, [name], MD, 40 mg at 02/09/21 2042 .  feeding supplement (NEPRO CARB STEADY) liquid 237 mL, 237 mL, Oral, TID WC, [name], MD, Last Rate: 0 mL/hr at 02/10/21 0210, 237 mL at 02/10/21 0827 .  fluconazole (DIFLUCAN) tablet 100 mg, 100 mg, Oral, Daily, [name], MD, 100 mg at 02/10/21 0923 .  fluticasone (FLONASE) 50 MCG/ACT nasal spray 1 spray, 1 spray, Each Nare, QHS, Para Skeans, MD, 1 spray at 02/09/21 2043 .  gabapentin (NEURONTIN) capsule 300 mg, 300 mg, Oral, TID, Para Skeans, MD, 300 mg at 02/10/21 0938 .  guaiFENesin-dextromethorphan (ROBITUSSIN DM) 100-10 MG/5ML syrup 5-10 mL, 5-10 mL, Oral, Q4H PRN, Nolberto Hanlon, MD, 10 mL at 02/08/21 2042 .  HYDROcodone-acetaminophen (NORCO/VICODIN) 5-325 MG per tablet 1 tablet, 1 tablet, Oral, Q4H PRN, Para Skeans, MD, 1 tablet at 02/09/21 2041 .  insulin aspart (novoLOG) injection 0-9 Units, 0-9 Units, Subcutaneous, TID WC, [name], MD, 2 Units at 02/09/21 1644 .  Ipratropium-Albuterol (COMBIVENT) respimat 1 puff, 1  puff, Inhalation, Q6H, [name], MD, 1 puff at 02/09/21 2042 .  lactulose (CHRONULAC) 10 GM/15ML solution 30 g, 30 g, Oral, Daily PRN, [name], MD, 30 g at 02/09/21 1759 .  [COMPLETED] alum & mag hydroxide-simeth (MAALOX/MYLANTA) 200-200-20 MG/5ML suspension 30 mL, 30 mL, Oral, Once, 30 mL at 02/07/21 1521 **AND** lidocaine (XYLOCAINE) 2 % viscous mouth solution 15 mL, 15 mL, Oral, Q3H PRN, Ottie Glazier, MD, 15 mL at 02/08/21 2101 .  multivitamin with minerals tablet 1 tablet, 1 tablet, Oral, Daily, [name], MD, 1 tablet at 02/09/21 0750 .  oxybutynin (DITROPAN-XL) 24 hr tablet 15 mg, 15 mg, Oral, Daily, Para Skeans, MD, 15 mg at 02/10/21 0938 .  pantoprazole (PROTONIX) EC tablet 40 mg, 40 mg, Oral, BID, [name], MD, 40 mg at 02/10/21 0939 .  polyethylene glycol (MIRALAX / GLYCOLAX) packet 17 g, 17 g, Oral, Daily PRN, [name], MD .  Derrill Memo ON 02/11/2021] predniSONE (DELTASONE) tablet 20 mg, 20 mg, Oral, Q breakfast, Wouk, Ailene Rud, MD .  rOPINIRole (REQUIP) tablet 0.25 mg, 0.25 mg, Oral, QHS, Amery, Sahar, MD, 0.25 mg at 02/09/21 2040 .  senna-docusate (Senokot-S) tablet 1 tablet, 1 tablet, Oral, BID, [name], MD, 1 tablet at 02/09/21 2041 .  zolpidem (AMBIEN) tablet 5 mg, 5 mg, Oral, QHS, [name], MD, 5 mg at 02/09/21 2041  Facility-Administered Medications Ordered in Other Encounters:  .  cyanocobalamin ((VITAMIN B-12)) injection 1,000 mcg, 1,000 mcg, Intramuscular, Q30 days, Sindy Guadeloupe, MD, 1,000 mcg at 09/28/18 1013 .  cyanocobalamin ((VITAMIN B-12)) injection 1,000 mcg, 1,000 mcg, Intramuscular, Q30 days, Sindy Guadeloupe, MD, 1,000 mcg at 11/30/18 1355 .  cyanocobalamin ((VITAMIN B-12)) injection 1,000 mcg, 1,000 mcg, Intramuscular, Q30 days, Sindy Guadeloupe, MD, 1,000 mcg at 08/16/19 1144    ALLERGIES   Sulfa antibiotics     REVIEW OF SYSTEMS    Review of Systems:  Gen:  Denies  fever, sweats,  chills weigh loss  HEENT: Denies blurred vision, double vision, ear  pain, eye pain, hearing loss, nose bleeds, sore throat Cardiac:  No dizziness, chest pain or heaviness, chest tightness,edema Resp:   Denies cough or sputum porduction, shortness of breath,wheezing, hemoptysis,  Gi: Denies swallowing difficulty, stomach pain, nausea or vomiting, diarrhea, constipation, bowel incontinence Gu:  Denies bladder incontinence, burning urine Ext:   Denies Joint pain, stiffness or swelling Skin: Denies  skin rash, easy bruising or bleeding or hives Endoc:  Denies polyuria, polydipsia , polyphagia or weight change Psych:   Denies depression, insomnia or hallucinations   Other:  All other systems negative   VS: BP 123/86 (BP Location: Left Arm)   Pulse 80   Temp 97.6 F (36.4 C) (Oral)   Resp 18   Ht 5' 1"  (1.549 m)   Wt 62 kg   SpO2 99%   BMI 25.83 kg/m      PHYSICAL EXAM    GENERAL:NAD, no fevers, chills, no weakness no fatigue HEAD: Normocephalic, atraumatic.  EYES: Pupils equal, round, reactive to light. Extraocular muscles intact. No scleral icterus.  MOUTH: Moist mucosal membrane. Dentition intact. No abscess noted.  EAR, NOSE, THROAT: Clear without exudates. No external lesions.  NECK: Supple. No thyromegaly. No nodules. No JVD.  PULMONARY: Diffuse coarse rhonchi right sided +wheezes CARDIOVASCULAR: S1 and S2. Regular rate and rhythm. No murmurs, rubs, or gallops. No edema. Pedal pulses 2+ bilaterally.  GASTROINTESTINAL: Soft, nontender, nondistended. No masses. Positive bowel sounds. No hepatosplenomegaly.  MUSCULOSKELETAL: No swelling, clubbing, or edema. Range of motion full in all extremities.  NEUROLOGIC: Cranial nerves II through XII are intact. No gross focal neurological deficits. Sensation intact. Reflexes intact.  SKIN: No ulceration, lesions, rashes, or cyanosis. Skin warm and dry. Turgor intact.  PSYCHIATRIC: Mood, affect within normal limits. The patient is awake,  alert and oriented x 3. Insight, judgment intact.       IMAGING    DG Chest 2 View  Result Date: 01/29/2021 CLINICAL DATA:  Cough, body aches, COVID-19 positive EXAM: CHEST - 2 VIEW COMPARISON:  06/08/2020 CT 10/31/2018 FINDINGS: Frontal and lateral views of the chest demonstrate an unremarkable cardiac silhouette. Stable 1.5 cm left lower lobe pulmonary nodule. The remaining small nodule seen on recent chest CT are not readily apparent by x-ray. No acute airspace disease, effusion, or pneumothorax. Minimal scarring at the left lung base. No acute bony abnormality. IMPRESSION: 1. No acute intrathoracic process. 2. Stable 1.5 cm left lower lobe pulmonary nodule, unchanged since 2020. The remaining pulmonary nodule seen on the recent chest CT 08/21/2020 are not readily apparent by x-ray. Electronically Signed   By: Randa Ngo M.D.   On: 01/29/2021 16:47   CT Angio Chest PE W and/or Wo Contrast  Result Date: 01/31/2021 CLINICAL DATA:  Ongoing symptoms of COVID. EXAM: CT ANGIOGRAPHY CHEST WITH CONTRAST TECHNIQUE: Multidetector CT imaging of the chest was performed using the standard protocol during bolus administration of intravenous contrast. Multiplanar CT image reconstructions and MIPs were obtained to evaluate the vascular anatomy. CONTRAST:  41m OMNIPAQUE IOHEXOL 350 MG/ML SOLN COMPARISON:  August 21, 2020 FINDINGS: Cardiovascular: There is moderate severity calcification of the aortic arch, without evidence of aortic aneurysm or dissection. Satisfactory opacification of the pulmonary arteries to the segmental level. No evidence of pulmonary embolism. Normal heart size. No pericardial effusion. Mediastinum/Nodes: No enlarged mediastinal, hilar, or axillary lymph nodes. Thyroid gland, trachea, and esophagus demonstrate no significant findings. Lungs/Pleura: Mild atelectasis is seen within the posterior aspects of the bilateral lung left greater than right. There is  no evidence of acute infiltrate,  pleural effusion or pneumothorax. Upper Abdomen: There is a small hiatal hernia. A 1.3 cm diameter simple cyst is seen along the anterior aspect of the upper pole of the left kidney. Musculoskeletal: A 4.9 cm x 2.3 cm lipoma is seen along the anterolateral aspect of the lower right chest wall. No acute osseous abnormalities are identified. Review of the MIP images confirms the above findings. IMPRESSION: 1. No CT evidence of pulmonary embolism or acute cardiopulmonary disease. 2. Small hiatal hernia. 3. 4.9 cm x 2.3 cm lower right chest wall lipoma. 4. Aortic atherosclerosis. Aortic Atherosclerosis (ICD10-I70.0). Electronically Signed   By: Virgina Norfolk M.D.   On: 01/31/2021 20:23    Media Information Document Information  Photos    02/06/2021 13:27  Attached To:  Hospital Encounter on 01/31/21   Source Information  Ottie Glazier, MD  [facility] (1c)    ASSESSMENT/PLAN   Chronic COPD with chronic bronchitis -She is on pred 40 bid which should help with inflammatory changes -will swab for non covid RVP virus to see if other infections are overlying -agree with [facility] and Combivent -will ask if mucomyst could be delivered by RT but unsure if this can be done while on covid precautions   Atelectasis of LLL Continue use of IS - currently at 750cc/h -mucinex   GERD with hiatal hernia  Review of CT chest to me looks like there is diverticulum , will order barium swallow to elucidate anatomic lesion and volume of regurgitant flow.  -Phenol spray QID and viscous lidocaine   COVID19 infection    - can check cycle threshold value with lab to see if its active if >35 then inactive old infection     - CRP/ferritin/ESR/MRSA pcr/ Procalcitonin  -continue current mgt     Thank you for allowing me to participate in the care of this patient.  Total face to face encounter time for this patient visit was >6mn. >50% of the time was  spent in counseling and coordination of care.    Patient/Family are satisfied with care plan and all questions have been answered.  This document was prepared using Dragon voice recognition software and may include unintentional dictation errors.     FOttie Glazier M.D.  Division of [facility]

## 2021-02-10 NOTE — TOC Initial Note (Addendum)
Transition of Care [place]) - Initial/Assessment Note    Patient Details  Name: Denise Watts MRN: [id number] Date of Birth: [birth year]  Transition of Care [facility]) CM/SW Contact:    Eileen Stanford, LCSW Phone Number: 02/10/2021, 3:35 PM  Clinical Narrative:  CSW spoke with pt. Pt is agreeable to [facility]. Pt states she does not have a agency preference but prefers a agency that accepts her insurance. Pt declines the need for any DME. CSW has sent referral to [facility] will accept.                Expected Discharge Plan: [place] Barriers to Discharge: Continued Medical Work up   Patient Goals and CMS Choice Patient states their goals for this hospitalization and ongoing recovery are:: to go home CMS Medicare.gov Compare Post Acute Care list provided to:: Patient Choice offered to / list presented to : Patient  Expected Discharge Plan and Services Expected Discharge Plan: [place] In-house Referral: NA Discharge Planning Services: CM Consult Post Acute Care Choice: [place] arrangements for the past 2 months: Single Family Home                 DME Arranged: N/A DME Agency: NA       HH Arranged: PT   Date HH Agency Contacted: 02/10/21 Time HH Agency Contacted: [phone] Representative spoke with at [place]: Alex  Prior Living Arrangements/Services Living arrangements for the past 2 months: [place] Lives with:: Spouse Patient language and need for interpreter reviewed:: Yes Do you feel safe going back to the place where you live?: Yes      Need for Family Participation in Patient Care: Yes (Comment) Care giver support system in place?: Yes (comment)   Criminal Activity/Legal Involvement Pertinent to Current Situation/Hospitalization: No - Comment as needed  Activities of Daily Living Home Assistive Devices/Equipment: None ADL Screening (condition at time of admission) Patient's cognitive ability adequate to safely  complete daily activities?: Yes Is the patient deaf or have difficulty hearing?: No Does the patient have difficulty seeing, even when wearing glasses/contacts?: No Does the patient have difficulty concentrating, remembering, or making decisions?: Yes Patient able to express need for assistance with ADLs?: Yes Does the patient have difficulty dressing or bathing?: No Independently performs ADLs?: Yes (appropriate for developmental age) Does the patient have difficulty walking or climbing stairs?: No Weakness of Legs: None Weakness of Arms/Hands: None  Permission Sought/Granted Permission sought to share information with : Family Supports Permission granted to share information with : Yes, Verbal Permission Granted  Share Information with NAME: joseph  Permission granted to share info w AGENCY: home health agencies  Permission granted to share info w Relationship: spouse  Permission granted to share info w Contact Information: [phone]  Emotional Assessment Appearance:: Appears stated age Attitude/Demeanor/Rapport: Engaged Affect (typically observed): Accepting Orientation: : Oriented to Self, Oriented to Place, Oriented to  Time, Oriented to Situation Alcohol / Substance Use: Not Applicable Psych Involvement: No (comment)  Admission diagnosis:  Acute respiratory failure with hypoxia ([place]) [J96.01] Urinary tract infection without hematuria, site unspecified [N39.0] COVID-19 [U07.1] Patient Active Problem List   Diagnosis Date Noted   Acute hypoxemic respiratory failure due to COVID-19 ([place]) 02/01/2021   SOB (shortness of breath) 02/01/2021   Acute respiratory failure with hypoxia ([place]) 02/01/2021   Centrilobular emphysema ([place]) 12/08/2020   Situational stress 10/15/2020   Anxiety and depression 10/15/2020   Chronic cough 08/31/2020  Encounter for screening colonoscopy    Polyp of transverse colon    Dysphagia    Stricture and stenosis of esophagus    Cervical  spondylosis 03/18/2019   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   Need for prophylactic vaccination and inoculation against influenza 01/25/2019   Postoperative breast asymmetry 01/04/2019   Status post partial mastectomy 01/04/2019   Vitamin D deficiency 12/12/2018   Bursitis of left shoulder 11/21/2018   Rotator cuff tendinitis, left 11/21/2018   Genetic testing 02/01/2018   Skin cancer 01/18/2018   Goals of care, counseling/discussion 01/12/2018   B12 deficiency 12/28/2017   Malignant neoplasm of lower-outer quadrant of right breast of female, estrogen receptor negative ([place]) 11/24/2017   Chronic abdominal pain (Primary Area of Pain)  04/03/2017   Chronic low back pain (Secondary Area of Pain) (B) (R>L) 04/03/2017   Chronic knee pain (Tertiary Area of Pain) (R) 04/03/2017   Chronic pain syndrome 04/03/2017   Disorder of bone, unspecified 04/03/2017   Other specified health status 04/03/2017   Other long term (current) drug therapy 04/03/2017   Long term current use of opiate analgesic 04/03/2017   Chronic sacroiliac joint pain 04/03/2017   S/P repair of ventral hernia 03/03/2017   Incisional hernia, without obstruction or gangrene 02/13/2017   Right adrenal mass ([place]) 12/28/2016   RIGHT Adrenal Mass 12/16/2016   Fatigue 02/17/2016   Other fatigue 02/17/2016   Multiple lung nodules on CT 02/17/2016   History of right breast cancer 02/17/2016   Peripheral neuropathy 02/17/2016   Closed fracture of a rib 02/02/2015   History of fundoplication 65/99/3570   History of throat cancer 01/15/2015   Acid reflux 01/15/2015   Gonalgia 01/15/2015   Closed fracture of pubis ([place]) 01/15/2015   Pelvic fracture ([employer]) 01/12/2015   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 [place] [address] Phone: [phone] Fax: [phone]     Social Determinants of Health (SDOH) Interventions    Readmission Risk Interventions No flowsheet data found.

## 2021-02-10 NOTE — Progress Notes (Signed)
Denise Watts  HUD:[id number] DOB: October 16, 1955 DOA: 01/31/2021 PCP: Jerrol Banana., MD    Brief Narrative:  [phone] w/ a hx of laryngeal cancer status post radiation therapy 1998 with residual VCD, emphysema (45 pack year hx/stopped 1997), breast cancer status post lumpectomy and XRT 2019, anxiety, dysphagia, B12 and vitamin D deficiencies, right adrenal mass, tobacco abuse, and dyslipidemia who was seen in the ED with complaints of shortness of breath.  Patient also had COVID symptoms with cough & congestion for about 10 days and was diagnosed with COVID on multiple home tests, as well as in the ED on 01/29/2021..  Date of Positive COVID Test:  01/29/21 (last day of isolation 02/08/21) - NOW OFF ISOLATION  COVID-19 specific Treatment: Remdesivir 10/3 > 10/5  Consultants:  Pulmonary GI ENT  Code Status: FULL CODE  Antimicrobials:  Azithromycin 10/3 > 10/4 Rocephin 10/3 > 10/5  DVT prophylaxis: Lovenox  Subjective: Afebrile.  Vital signs stable.  No acute events reported overnight.  Reports ongoing dyspnea without significant change.    Assessment & Plan:  Acute hypoxic respiratory failure -acute bronchospastic COPD exacerbation - resolved  continue inhaled medications - weaning steroid (currently pred 20 qd, that dose started 10/11)- no evidence of pneumonia on CT chest and procalcitonin <0.1 - patient quit smoking years ago - Pulmonary has seen in consultation - full RVP negative   Persistent Dyspnea  Appears to be a rather complex multifactorial issue, with contributions to include vocal cord dysfunction, possible stenosis of the larynx, probable esophageal dysmotility, possible esophageal diverticulum, and probable esophageal stricture -dysphagia confirmed by MBS 10/10 with diet now modified - will need ongoing outpatient SLP therapy - due to her dysphagia cannot complete a formal barium esophagram - GI has evaluated and given the proximal nature of her probable esophageal  lesions feels that she would be better suited for evaluation via ENT/laryngoscopy as opposed to EGD  - ENT consulted, eval pending  Oropharyngeal thrush Initiated Diflucan therapy  COVID - incidental  Suspect this was simply an incidental finding - stopped Remdesivir - no specific therapy was indicated for COVID - no parenchymal disease noted on CT scan chest -inflammatory markers not suggestive of an acute phase of this infection  Anxiety and depression Continue usual BuSpar and Cymbalta - perhaps this is contributing to her sx as well but is not felt to be the primary issue  History of B12 deficiency Review of records notes patient has a history of a severe B12 deficiency - B12 565 during this admission  Hyperglycemia No prior history of DM -likely simply due to steroids -A1c 6.4 therefore not yet meeting criteria for diabetes -discussed prediabetic state with patient - wean steroids quickly   E. coli UTI v/s asymptomatic bacteruria  Multiple prior urine cultures in this patient have revealed the same organism -the patient does not presently have dysuria or other symptoms that I feel strongly suggest an active UTI -I suspect her urinary tract is simply colonized with this bacteria -monitor without specific directed antibiotic therapy  GERD Continue usual PPI - Pulmonary suspects possible esophageal diverticulum - see above regarding GI eval   Family Communication: Husband Joe updated telephonically 10/12 Status is: Inpatient  Remains inpatient appropriate because:Inpatient level of care appropriate due to severity of illness  Dispo: The patient is from: Home              Anticipated d/c is to: Home with home health PT, SLP  Patient currently is not medically stable to d/c.   Difficult to place patient No   Objective: Blood pressure 123/86, pulse 80, temperature 97.6 F (36.4 C), temperature source Oral, resp. rate 18, height 5\' 1"  (1.549 m), weight 62 kg, SpO2 99  %. No intake or output data in the 24 hours ending 02/10/21 1101 Filed Weights   01/31/21 1903  Weight: 62 kg    Examination: General: NAD Lungs: Distant breath sounds with poor air movement th/o all fields, no expiratory wheezing , no focal crackles  Cardiovascular: RRR w/o M or rub  Abdomen: NT/ND, soft, bowel sounds positive, no rebound, no ascites Extremities: No C/C/E B LE  CBC: Recent Labs  Lab 02/07/21 0401  WBC 9.6  HGB 13.2  HCT 40.0  MCV 98.0  PLT 099   Basic Metabolic Panel: Recent Labs  Lab 02/07/21 0401  NA 139  K 4.6  CL 100  CO2 29  GLUCOSE 165*  BUN 19  CREATININE 0.87  CALCIUM 8.9   GFR: Estimated Creatinine Clearance: 53 mL/min (by C-G formula based on SCr of 0.87 mg/dL).   Recent Results (from the past 240 hour(s))  Urine Culture     Status: Abnormal   Collection Time: 01/31/21 11:41 PM   Specimen: Urine, Random  Result Value Ref Range Status   Specimen Description   Final    URINE, RANDOM Performed at [facility], [address] [place], [place] [address]    Special Requests   Final    NONE Performed at [facility], [address] [place], [place] [address]    Culture >=100,000 COLONIES/mL ESCHERICHIA COLI (A)  Final   Report Status 02/02/2021 FINAL  Final   Organism ID, Bacteria ESCHERICHIA COLI (A)  Final      Susceptibility   Escherichia coli - MIC*    AMPICILLIN <=2 SENSITIVE Sensitive     CEFAZOLIN <=4 SENSITIVE Sensitive     CEFEPIME <=0.12 SENSITIVE Sensitive     CEFTRIAXONE <=0.25 SENSITIVE Sensitive     CIPROFLOXACIN <=0.25 SENSITIVE Sensitive     GENTAMICIN <=1 SENSITIVE Sensitive     IMIPENEM <=0.25 SENSITIVE Sensitive     NITROFURANTOIN <=16 SENSITIVE Sensitive     TRIMETH/SULFA <=20 SENSITIVE Sensitive     AMPICILLIN/SULBACTAM <=2 SENSITIVE Sensitive     PIP/TAZO <=4 SENSITIVE Sensitive     * >=100,000 COLONIES/mL ESCHERICHIA COLI  Blood culture (routine x 2)     Status: None   Collection  Time: 02/01/21 12:56 AM   Specimen: BLOOD  Result Value Ref Range Status   Specimen Description BLOOD LEFT ANTECUBITAL  Final   Special Requests   Final    BOTTLES DRAWN AEROBIC AND ANAEROBIC Blood Culture adequate volume   Culture   Final    NO GROWTH 5 DAYS Performed at [facility], [address] [place], [place] [address]    Report Status 02/06/2021 FINAL  Final  Culture, blood (Routine X 2) w Reflex to ID Panel     Status: None   Collection Time: 02/01/21  1:42 AM   Specimen: BLOOD  Result Value Ref Range Status   Specimen Description BLOOD LEFT FOREARM  Final   Special Requests   Final    BOTTLES DRAWN AEROBIC AND ANAEROBIC Blood Culture adequate volume   Culture   Final    NO GROWTH 5 DAYS Performed at [facility], [address], [place], [place] [address]    Report Status 02/06/2021 FINAL  Final  Respiratory (~20 pathogens) panel by PCR     Status: None   Collection Time: 02/06/21  1:40 PM   Specimen: Nasopharyngeal Swab; Respiratory  Result Value Ref Range Status   Adenovirus NOT DETECTED NOT DETECTED Final   Coronavirus 229E NOT DETECTED NOT DETECTED Final    Comment: (NOTE) The Coronavirus on the Respiratory Panel, DOES NOT test for the novel  Coronavirus (2019 nCoV)    Coronavirus HKU1 NOT DETECTED NOT DETECTED Final   Coronavirus NL63 NOT DETECTED NOT DETECTED Final   Coronavirus OC43 NOT DETECTED NOT DETECTED Final   Metapneumovirus NOT DETECTED NOT DETECTED Final   Rhinovirus / Enterovirus NOT DETECTED NOT DETECTED Final   Influenza A NOT DETECTED NOT DETECTED Final   Influenza B NOT DETECTED NOT DETECTED Final   Parainfluenza Virus 1 NOT DETECTED NOT DETECTED Final   Parainfluenza Virus 2 NOT DETECTED NOT DETECTED Final   Parainfluenza Virus 3 NOT DETECTED NOT DETECTED Final   Parainfluenza Virus 4 NOT DETECTED NOT DETECTED Final   Respiratory Syncytial Virus NOT DETECTED NOT DETECTED Final   Bordetella pertussis NOT DETECTED NOT  DETECTED Final   Bordetella Parapertussis NOT DETECTED NOT DETECTED Final   Chlamydophila pneumoniae NOT DETECTED NOT DETECTED Final   Mycoplasma pneumoniae NOT DETECTED NOT DETECTED Final    Comment: Performed at [facility], [address], [place], [place] [address]  MRSA Next Gen by PCR, Nasal     Status: None   Collection Time: 02/06/21  2:50 PM   Specimen: Nasal Mucosa; Nasal Swab  Result Value Ref Range Status   MRSA by PCR Next Gen NOT DETECTED NOT DETECTED Final    Comment: (NOTE) The GeneXpert MRSA Assay (FDA approved for NASAL specimens only), is one component of a comprehensive MRSA colonization surveillance program. It is not intended to diagnose MRSA infection nor to guide or monitor treatment for MRSA infections. Test performance is not FDA approved in patients less than 9 years old. Performed at [facility], [address] [place], [place] [address]       Scheduled Meds:  aspirin EC  81 mg Oral Daily   atorvastatin  40 mg Oral Daily   busPIRone  5 mg Oral BID   DULoxetine  60 mg Oral Daily   enoxaparin (LOVENOX) injection  40 mg Subcutaneous Q24H   feeding supplement (NEPRO CARB STEADY)  237 mL Oral TID WC   fluconazole  100 mg Oral Daily   fluticasone  1 spray Each Nare QHS   gabapentin  300 mg Oral TID   insulin aspart  0-9 Units Subcutaneous TID WC   Ipratropium-Albuterol  1 puff Inhalation Q6H   multivitamin with minerals  1 tablet Oral Daily   nystatin  5 mL Oral QID   oxybutynin  15 mg Oral Daily   pantoprazole  40 mg Oral BID   rOPINIRole  0.25 mg Oral QHS   senna-docusate  1 tablet Oral BID   zolpidem  5 mg Oral QHS      LOS: 9 days   Laurey Arrow, MD [facility]   If 7PM-7AM, please contact night-coverage per Amion 02/10/2021, 11:01 AM

## 2021-02-10 NOTE — Progress Notes (Addendum)
Denise Antigua, MD [address], [place], [place], [place], [address] [address], [place], [place], [place], [address] Phone: [phone]  Fax: [phone]   Subjective: Patient sitting in bed with food tray in front of her.  Patient was evaluated by ENT and underwent laryngoscopy today, showing a normal hypopharynx, larynx, but going up vocal cord mobility, no signs of any lesions or abnormal anatomy.  ENT note states that she does not appear to be reporting any secretions or food currently even though she was interrupted during her lunch.  As per their note "there is slight dilation of the esophagus just below the cricopharyngeus muscle that at times looks like a diverticula.  You can only see this on a side view because that is where he all the x-rays were done.  This appears posterior but you cannot tell laterality. There appears to be a stricture of the upper esophagus just below this. It appears this may be a dilation of the upper esophagus just above the stricture, rather than a true diverticula. The rest of the esophagus is not visualized on this exam..Marland KitchenShe should have continued follow-up with speech therapy to evaluate after esophageal dilation.  I believe this is best done with flexible esophagoscopy that does not require a general anesthesia to perform like rigid esophagoscopy would. She does not need any specific treatment for her larynx as this looks healthy and better than expected postradiation treatment."   Objective: Exam: Vital signs in last 24 hours: Vitals:   02/10/21 0039 02/10/21 0504 02/10/21 0820 02/10/21 1100  BP: 115/77 109/74 123/86 111/78  Pulse: 88 76 80 88  Resp: 16 16 18 16   Temp: 97.6 F (36.4 C) 97.6 F (36.4 C) 97.6 F (36.4 C) 98 F (36.7 C)  TempSrc: Oral Oral Oral Oral  SpO2: 97% 98% 99% 100%  Weight:      Height:       Weight change:  No intake or output data in the 24 hours ending 02/10/21 1515  Constitutional: General:   Alert,   Well-developed, well-nourished, pleasant and cooperative in NAD BP 111/78 (BP Location: Left Arm)   Pulse 88   Temp 98 F (36.7 C) (Oral)   Resp 16   Ht 5\' 1"  (1.549 m)   Wt 62 kg   SpO2 100%   BMI 25.83 kg/m   Eyes:  Sclera clear, no icterus.   Conjunctiva pink.   Ears:  No scars, lesions or masses, Normal auditory acuity. Nose:  No deformity, discharge, or lesions. Mouth:  No deformity or lesions, oropharynx pink & moist.  Neck:  Supple; no masses, trachea midline  Respiratory: Normal respiratory effort  Gastrointestinal:  Soft, non-tender and non-distended without masses, hepatosplenomegaly or hernias noted.  No guarding or rebound tenderness.     Cardiac: No clubbing or edema.  No cyanosis. Normal posterior tibial pedal pulses noted.  Lymphatic:  No significant cervical adenopathy.  Psych:  Alert and cooperative. Normal mood and affect.  Musculoskeletal:  Head normocephalic, atraumatic. 5/5 Lower extremity strength bilaterally.  Skin: Warm. Intact without significant lesions or rashes. No jaundice.  Neurologic:  Face symmetrical, tongue midline, Normal sensation to touch  Psych:  Alert and oriented x3, Alert and cooperative. Normal mood and affect.   Lab Results: Lab Results  Component Value Date   WBC 9.6 02/07/2021   HGB 13.2 02/07/2021   HCT 40.0 02/07/2021   MCV 98.0 02/07/2021   PLT 213 02/07/2021   Micro Results: Recent Results (from the  past 240 hour(s))  Urine Culture     Status: Abnormal   Collection Time: 01/31/21 11:41 PM   Specimen: Urine, Random  Result Value Ref Range Status   Specimen Description   Final    URINE, RANDOM Performed at [facility], [address] [place], [place] [address]    Special Requests   Final    NONE Performed at [facility], [address] [place], [place] [address]    Culture >=100,000 COLONIES/mL ESCHERICHIA COLI (A)  Final   Report Status 02/02/2021 FINAL  Final   Organism ID,  Bacteria ESCHERICHIA COLI (A)  Final      Susceptibility   Escherichia coli - MIC*    AMPICILLIN <=2 SENSITIVE Sensitive     CEFAZOLIN <=4 SENSITIVE Sensitive     CEFEPIME <=0.12 SENSITIVE Sensitive     CEFTRIAXONE <=0.25 SENSITIVE Sensitive     CIPROFLOXACIN <=0.25 SENSITIVE Sensitive     GENTAMICIN <=1 SENSITIVE Sensitive     IMIPENEM <=0.25 SENSITIVE Sensitive     NITROFURANTOIN <=16 SENSITIVE Sensitive     TRIMETH/SULFA <=20 SENSITIVE Sensitive     AMPICILLIN/SULBACTAM <=2 SENSITIVE Sensitive     PIP/TAZO <=4 SENSITIVE Sensitive     * >=100,000 COLONIES/mL ESCHERICHIA COLI  Blood culture (routine x 2)     Status: None   Collection Time: 02/01/21 12:56 AM   Specimen: BLOOD  Result Value Ref Range Status   Specimen Description BLOOD LEFT ANTECUBITAL  Final   Special Requests   Final    BOTTLES DRAWN AEROBIC AND ANAEROBIC Blood Culture adequate volume   Culture   Final    NO GROWTH 5 DAYS Performed at [facility], [address] [place], [place] [address]    Report Status 02/06/2021 FINAL  Final  Culture, blood (Routine X 2) w Reflex to ID Panel     Status: None   Collection Time: 02/01/21  1:42 AM   Specimen: BLOOD  Result Value Ref Range Status   Specimen Description BLOOD LEFT FOREARM  Final   Special Requests   Final    BOTTLES DRAWN AEROBIC AND ANAEROBIC Blood Culture adequate volume   Culture   Final    NO GROWTH 5 DAYS Performed at [facility], [address] [place], [place] [address]    Report Status 02/06/2021 FINAL  Final  Respiratory (~20 pathogens) panel by PCR     Status: None   Collection Time: 02/06/21  1:40 PM   Specimen: Nasopharyngeal Swab; Respiratory  Result Value Ref Range Status   Adenovirus NOT DETECTED NOT DETECTED Final   Coronavirus 229E NOT DETECTED NOT DETECTED Final    Comment: (NOTE) The Coronavirus on the Respiratory Panel, DOES NOT test for the novel  Coronavirus (2019 nCoV)    Coronavirus HKU1 NOT DETECTED NOT  DETECTED Final   Coronavirus NL63 NOT DETECTED NOT DETECTED Final   Coronavirus OC43 NOT DETECTED NOT DETECTED Final   Metapneumovirus NOT DETECTED NOT DETECTED Final   Rhinovirus / Enterovirus NOT DETECTED NOT DETECTED Final   Influenza A NOT DETECTED NOT DETECTED Final   Influenza B NOT DETECTED NOT DETECTED Final   Parainfluenza Virus 1 NOT DETECTED NOT DETECTED Final   Parainfluenza Virus 2 NOT DETECTED NOT DETECTED Final   Parainfluenza Virus 3 NOT DETECTED NOT DETECTED Final   Parainfluenza Virus 4 NOT DETECTED NOT DETECTED Final   Respiratory Syncytial Virus NOT DETECTED NOT DETECTED Final   Bordetella pertussis NOT DETECTED NOT DETECTED Final  Bordetella Parapertussis NOT DETECTED NOT DETECTED Final   Chlamydophila pneumoniae NOT DETECTED NOT DETECTED Final   Mycoplasma pneumoniae NOT DETECTED NOT DETECTED Final    Comment: Performed at [facility], [place] [address] [place], [place] [address]  MRSA Next Gen by PCR, Nasal     Status: None   Collection Time: 02/06/21  2:50 PM   Specimen: Nasal Mucosa; Nasal Swab  Result Value Ref Range Status   MRSA by PCR Next Gen NOT DETECTED NOT DETECTED Final    Comment: (NOTE) The GeneXpert MRSA Assay (FDA approved for NASAL specimens only), is one component of a comprehensive MRSA colonization surveillance program. It is not intended to diagnose MRSA infection nor to guide or monitor treatment for MRSA infections. Test performance is not FDA approved in patients less than 71 years old. Performed at [facility], [address] [place],  [address]    Studies/Results: No results found. Medications:  Scheduled Meds:  aspirin EC  81 mg Oral Daily   atorvastatin  40 mg Oral Daily   busPIRone  5 mg Oral BID   DULoxetine  60 mg Oral Daily   enoxaparin (LOVENOX) injection  40 mg Subcutaneous Q24H   feeding supplement (NEPRO CARB STEADY)  237 mL Oral TID WC   fluconazole  100 mg Oral Daily   fluticasone  1 spray  Each Nare QHS   gabapentin  300 mg Oral TID   insulin aspart  0-9 Units Subcutaneous TID WC   Ipratropium-Albuterol  1 puff Inhalation Q6H   multivitamin with minerals  1 tablet Oral Daily   oxybutynin  15 mg Oral Daily   pantoprazole  40 mg Oral BID   [START ON 02/11/2021] predniSONE  20 mg Oral Q breakfast   rOPINIRole  0.25 mg Oral QHS   senna-docusate  1 tablet Oral BID   zolpidem  5 mg Oral QHS   Continuous Infusions: PRN Meds:.albuterol, alum & mag hydroxide-simeth, cyclobenzaprine, guaiFENesin-dextromethorphan, HYDROcodone-acetaminophen, lactulose, [COMPLETED] alum & mag hydroxide-simeth **AND** lidocaine, polyethylene glycol   Assessment: Principal Problem:   SOB (shortness of breath) Active Problems:   Acid reflux   Anxiety and depression   Centrilobular emphysema (HCC)   Acute hypoxemic respiratory failure due to COVID-19 [facility])   Acute respiratory failure with hypoxia ([place])  Dysphagia  Plan: Above ENT notes reviewed and I discussed the patient in further detail with Dr. Kathyrn Sheriff and he does not think that there is a true diverticulum in the area as he detailed in his note.  He states that patient is eating what she is used to eating at home and so she is not in distress from her dysphagia and this is a chronic issue for her.  After detailed discussion with ENT and given that this is a chronic symptom, with recent recovery from [place] and chronic pulmonary issues, we decided that it may be best to perform an EGD as an outpatient for possible repeat dilation of the area, which may allow her to have a safer endoscopy with less risk of hypoxia  He states he does not performs flexible laryngoscopy's and defers this to GI  Therefore, can proceed with EGD as an outpatient after about a month, to allow for a safer exam for the patient from a pulmonary standpoint  However, if dysphagia worsens inpatient EGD can be considered  GI service will sign off at this time, please page  with any questions or concerns     LOS: 9 days  Denise Antigua, MD 02/10/2021, 3:15 PM

## 2021-02-10 NOTE — Progress Notes (Signed)
Speech Language Pathology Treatment: Dysphagia  Patient Details Name: Denise Watts MRN: [id number] DOB: Nov 23, 1955 Today's Date: 02/10/2021 Time: 1435-1510 SLP Time Calculation (min) (ACUTE ONLY): 35 min  Assessment / Plan / Recommendation Clinical Impression  Met w/ pt and discussed results of MBSS and recommendations for a modified diet w/ aspiration precautions and swallowing strategies. Also assessed pt's degree of oral Thrush (coating complete tongue including underneath/laterally) and consulted MD re: further Medication/tx for pt's Thrush -- systemic focused tx d/t pt's dysphagia and inability to swallow liquid rinse. MD has ordered Diflucan. Pt stated she is taking more bites/sips at meals; min less throat discomfort. Encouraged pt to NOT use the throat sprays d/t the numbing effect as well as being a liquid(risk for aspiration). Pt agreed.  Pt consumed po trials of Nectar liquids w/ no immediate, overt s/s of aspiration - pt was able to appropriately demonstrate her swallowing strategies during(w/ intermittent verbal cue) as posted on wall in room. She is following recommendations for oral care prior to po's, including PRN ice chips. She is mostly sitting in the chair at bedside w/ meals.  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 Encouraged pt to continue the modified diet w/ strategies and precautions, f/u w/ Outpt ST services for dysphagia tx and repeat MBSS, and to f/u w/ GI when able to address Esophageal concerns(as pt had planned to do prior to admit).  ST services will continue to f/u for education and monitoring of status while admitted.      HPI HPI: Pt is a 65yo F whx of laryngeal CA s/p radiation therapy in 1998 with residual VCD, COPD/emphysema, Severe GERD with Hiatal hernia, hx of Breast CA s/p lumpectomy and radiation, MDD, anxiety disorder OA came in with complaints of cough and dyspnea and was +for  COVID 01/27/21 noted to be hypoxemic in ER on admission with mild leukocytosis. She had CTPE done w/ findings including bronchitic changes bilaterally with LLL platelike atelectasis worse posteriorly as well as generous PA diameter with SV:XBLTJ index >1 suggestive of PH. Pictorial documentation included in imaging section below.  Patient able to take IS tidal volume to 750cc non labored but coughs with deep inspiration. She reports feeling inspissated phelgm in chest but too weak to expectorate debris.  CT Chest Imagining: atelectasis is seen within the posterior aspects  of the bilateral lung left greater than right. There is no evidence of acute infiltrate, pleural effusion or  pneumothorax.      SLP Plan  Continue with current plan of care (f/u Outpt)      Recommendations for follow up therapy are one component of a multi-disciplinary discharge planning process, led by the attending physician.  Recommendations may be updated based on patient status, additional functional criteria and insurance authorization.    Recommendations  Diet recommendations: Dysphagia 1 (puree);Nectar-thick liquid Liquids provided via: Cup;No straw Medication Administration: Crushed with puree (for easier clearing of UES/esophagus) Supervision: Patient able to self feed Compensations: Minimize environmental distractions;Small sips/bites;Slow rate;Lingual sweep for clearance of pocketing;Multiple dry swallows after each bite/sip;Follow solids with liquid;Chin tuck;Effortful swallow;Hard cough after swallow Postural Changes and/or Swallow Maneuvers: Out of bed for meals;Seated upright 90 degrees;Chin tuck;Upright 30-60 min after meal                General recommendations:  (Dietician f/u; GI f/u for Esophageal assessment) Oral Care Recommendations: Oral care BID;Oral care before and after PO;Patient independent with oral  care;Oral care prior to ice chip/H20 Follow up Recommendations: Outpatient SLP SLP Visit  Diagnosis: Dysphagia, pharyngoesophageal phase (R13.14);Dysphagia, pharyngeal phase (R13.13) Plan: Continue with current plan of care (f/u Outpt)       GO                 Orinda Kenner, [place], CCC-SLP Speech Language Pathologist Rehab Services [phone] [facility] 02/10/2021, 3:21 PM

## 2021-02-10 NOTE — Op Note (Signed)
02/10/2021  12:52 PM    Watts, Denise Media  [id number]   Pre-Op Dx: Status post Ca of the larynx in 1997 with radiation therapy for cure, pharyngoesophageal dysphagia  Post-op Dx: Status post Ca of the larynx in 1997 with radiation therapy for cure.  Healthy larynx with bilateral vocal cord mobility.  Pharyngoesophageal dysphagia  Proc: Flexible laryngoscopy  Surg:  [name]  Anes:  GOT  EBL: None  Comp: None  Findings: Normal-appearing larynx with good vocal cord mobility.  No mucosal swelling or abnormal anatomy in the larynx or hypopharynx.  Procedure: The patient was seen at the bedside.  Her nose is sprayed with Afrin mixed with lidocaine 4% to decongest and anesthetize the nasal mucous membranes.  A flexible scope was passed through her right nostril to visualize the hypopharynx and larynx.  Both nostrils were open and clear with no sign of inflammation or swelling.  The nasopharynx was clear.  The hypopharynx showed a normal epiglottis that was elevated and the larynx was easily seen.  The vocal cords were white and moved well to the midline on both sides.  The open widely in the subglottic space was clear.  There is no inflammation or swelling of the mucous membranes.  There is no distortion of the arytenoids or true vocal cords.  The piriform sinuses were clear and the posterior arytenoid area was clear as well.  Patient was handling her secretions well and there were no secretions pooling anywhere.  There were no lesions or inflammation noted anywhere.  Dispo: She tolerated this well at the bedside  Plan: No treatment necessary for the larynx.  I cannot see into the upper esophagus with a flexible laryngoscopy.  Elon Alas [name]  02/10/2021 12:52 PM

## 2021-02-11 ENCOUNTER — Ambulatory Visit: Payer: Medicare HMO | Admitting: Physical Therapy

## 2021-02-11 DIAGNOSIS — R0602 Shortness of breath: Secondary | ICD-10-CM | POA: Diagnosis not present

## 2021-02-11 LAB — GLUCOSE, CAPILLARY
Glucose-Capillary: 67 mg/dL — ABNORMAL LOW (ref 70–99)
Glucose-Capillary: 77 mg/dL (ref 70–99)
Glucose-Capillary: 169 mg/dL — ABNORMAL HIGH (ref 70–99)

## 2021-02-11 LAB — BASIC METABOLIC PANEL
Anion gap: 4 — ABNORMAL LOW (ref 5–15)
BUN: 30 mg/dL — ABNORMAL HIGH (ref 8–23)
CO2: 34 mmol/L — ABNORMAL HIGH (ref 22–32)
Calcium: 8.8 mg/dL — ABNORMAL LOW (ref 8.9–10.3)
Chloride: 98 mmol/L (ref 98–111)
Creatinine, Ser: 0.85 mg/dL (ref 0.44–1.00)
GFR, Estimated: >60 mL/min (ref >60)
Glucose, Bld: 101 mg/dL — ABNORMAL HIGH (ref 70–99)
Potassium: 4.9 mmol/L (ref 3.5–5.1)
Sodium: 136 mmol/L (ref 135–145)

## 2021-02-11 MED ORDER — PREDNISONE 5 MG PO TABS: 5.0000 mg | ORAL_TABLET | Freq: Every day | ORAL | 0 refills | Status: DC

## 2021-02-11 MED ORDER — FLUCONAZOLE 100 MG PO TABS: 100.0000 mg | ORAL_TABLET | Freq: Every day | ORAL | 0 refills | Status: DC

## 2021-02-11 NOTE — Progress Notes (Signed)
Inpatient Diabetes Program Recommendations  AACE/ADA: New Consensus Statement on Inpatient Glycemic Control (2015)  Target Ranges:  Prepandial:   less than 140 mg/dL      Peak postprandial:   less than 180 mg/dL (1-2 hours)      Critically ill patients:  140 - 180 mg/dL  Results for [name], [name] (MRN [id number]) as of 02/11/2021 07:34  Ref. Range 02/10/2021 07:42 02/10/2021 11:36 02/10/2021 17:35  Glucose-Capillary Latest Ref Range: 70 - 99 mg/dL 68 (L) 160 (H)  2 units Novolog  276 (H)  5 units Novolog   Results for Denise Watts, Denise Watts (MRN [id number]) as of 02/11/2021 07:34  Ref. Range 02/11/2021 07:28  Glucose-Capillary Latest Ref Range: 70 - 99 mg/dL 67 (L)   Admit with: Acute hypoxic respiratory failure/ COVID+   Current Insulin Orders: Novolog Sensitive Correction Scale/ SSI (0-9 units) TID AC    MD- Note mild Hypoglycemic events the last 2 mornings.  May consider reduction of the Novolog SSI to the Very Sensitive scale 0-6 units TID     --Will follow patient during hospitalization--  Wyn Quaker RN, MSN, CDE Diabetes Coordinator Inpatient Glycemic Control Team Team Pager: [phone] (8a-5p)

## 2021-02-11 NOTE — Progress Notes (Signed)
Physical Therapy Treatment Patient Details Name: Denise Watts MRN: [id number] DOB: 1955/11/12 Today's Date: 02/11/2021   History of Present Illness 65yo F w/ PMHx of laryngeal CA s/p radiation therapy with residual vocal cord dysfunction, COPD/emphysema, severe GERD with hiatal hernia, hx of Breast CA s/p lumpectomy and radiation, MDD, anxiety disorder, OA, chronic dysphagia, came in with complaints of cough and dyspnea and was +for COVID 01/27/21.    PT Comments    Pt received in bed agreeable to PT services. Remains mod-I for bed mobility and supervision for transfers and ambulation. Able to progress ambulation to 61' today with still intermittent need for SUE use of hand rail due to persistent minor dizziness. However pt does report ability to walk further prior to onset. Pt placed in recliner post ambulation and transported to staircase to assess ability to asc/desc stairs prior to returning to home environment. With minguard for safety, pt able to asc/desc 6 stairs with use of single hand rail in R hand and step-to pattern. No LOB noted and safe ability to navigate stairs despite minor LE fatigue and dizziness. Pt educated on "up with the good" and down with the bad" due to reports of knee pain with pt verbalizing understanding. Pt returned to room with all needs in reach with RN present. Pt will remain to benefit from HHPT to progress LE strengthening, safe ambulation, and address minor balance deficits to improve safety within home environment.    Recommendations for follow up therapy are one component of a multi-disciplinary discharge planning process, led by the attending physician.  Recommendations may be updated based on patient status, additional functional criteria and insurance authorization.  Follow Up Recommendations  Home health PT;Supervision for mobility/OOB     Equipment Recommendations  None recommended by PT    Recommendations for Other Services       Precautions /  Restrictions Precautions Precautions: Fall Restrictions Weight Bearing Restrictions: No     Mobility  Bed Mobility Overal bed mobility: Modified Independent               Patient Response: Cooperative  Transfers Overall transfer level: Modified independent Equipment used: None                Ambulation/Gait Ambulation/Gait assistance: Supervision Gait Distance (Feet): 108 Feet Assistive device: None Gait Pattern/deviations: Step-to pattern Gait velocity: decreased   General Gait Details: Cautious gait, intermittent need for Hand on railing in hallway due to minor dizziness. Does report later onset of dizziness with further walking and improves with sitting.   Stairs             Wheelchair Mobility    Modified Rankin (Stroke Patients Only)       Balance Overall balance assessment: Needs assistance Sitting-balance support: No upper extremity supported;Feet supported Sitting balance-Leahy Scale: Normal     Standing balance support: During functional activity;Single extremity supported Standing balance-Leahy Scale: Fair Standing balance comment: Intermittent need for HHA on railing for balance due to reports of minor dizziness.                            Cognition Arousal/Alertness: Awake/alert Behavior During Therapy: WFL for tasks assessed/performed Overall Cognitive Status: Within Functional Limits for tasks assessed  Exercises Other Exercises Other Exercises: asc/desc 6 stairs with single railing use in RUE.    General Comments General comments (skin integrity, edema, etc.): SPO2 remains > 97% with HR 96-98 BPM      Pertinent Vitals/Pain Pain Assessment: No/denies pain Pain Score: 0-No pain    Home Living                      Prior Function            PT Goals (current goals can now be found in the care plan section) Acute Rehab PT Goals Patient Stated  Goal: to feel better PT Goal Formulation: With patient Time For Goal Achievement: 02/24/21 Potential to Achieve Goals: Good Progress towards PT goals: Progressing toward goals    Frequency    Min 2X/week      PT Plan Current plan remains appropriate    Co-evaluation              AM-PAC PT "6 Clicks" Mobility   Outcome Measure  Help needed turning from your back to your side while in a flat bed without using bedrails?: None Help needed moving from lying on your back to sitting on the side of a flat bed without using bedrails?: None Help needed moving to and from a bed to a chair (including a wheelchair)?: None Help needed standing up from a chair using your arms (e.g., wheelchair or bedside chair)?: A Little Help needed to walk in hospital room?: A Little Help needed climbing 3-5 steps with a railing? : A Little 6 Click Score: 21    End of Session Equipment Utilized During Treatment: Gait belt Activity Tolerance: Patient tolerated treatment well Patient left: in chair;with call bell/phone within reach;with nursing/sitter in room Nurse Communication: Mobility status PT Visit Diagnosis: Other abnormalities of gait and mobility (R26.89);Muscle weakness (generalized) (M62.81)     Time: 0932-3557 PT Time Calculation (min) (ACUTE ONLY): 20 min  Charges:  $Gait Training: 8-22 mins                     Salem Caster. Fairly IV, PT, DPT Physical Therapist- [facility]  02/11/2021, 10:20 AM

## 2021-02-11 NOTE — Progress Notes (Signed)
Pulmonology         Date: 02/11/2021,   MRN# [id number] Denise Watts 06/07/1955     AdmissionWeight: 62 kg                 CurrentWeight: 71 kg   Referring physician: Dr Thereasa Solo   CHIEF COMPLAINT:   Persistent dyspnea and SOB at rest   HISTORY OF PRESENT ILLNESS AND SUBJECTIVE FINDINGS    65yo F whx of laryngeal CA s/p radiation therapy with residual VCD, COPD/emphysema, severe GERD with hiatal hernia, hx of Breast CA s/p lumpectomy and radiation, MDD, anxiety disorder OA came in with complaints of cough and dyspnea and was +for COVID 01/27/21 noted to be hypoxemic in ER on admission with mild leukocytosis. She had CTPE done which I reviewed independently, findings include bronchitic changes bilaterally with LLL platelike atelectasis worse posteriorly as well as generous PA diameter with YQ:MVHQI index >1 suggestive of PH. Pictorial documentation included in imaging section below.  Patient able to take IS tidal volume to 750cc non labored but coughs with deep inspiration. She reports feeling inspissated phelgm in chest but too weak to expectorate debris. PCCM consult for further eval and mgt.   02/07/21- Patient has not had RVP done yet.  She was unable to get barium swallow done due to only being available on weekdays and this is scheduled for Monday.  Husband was at bedside we reviewed medical plan. She continues to use IS and flutter valve for BPH.  Lab work with CRP borderline elevated. Phenol spray and viscous lidocaine added for throat discomfort due to Larygopharyngeal reflux.   02/08/21- patient reports improvement.  She still is not back to baseline reports dyspnea with showering. I have reordered barium swallow today as requested by radiology.   02/09/21- patient with no overnight events. S/p SLP with moderate to severe aspiration risk per Speech pathology.  Would continue per theyre recommendations. No additional findings from pulmonary besides transient atelectasis  as cause of SOB/DOE.  Aspiration pneumonitis remains a risk. May follow up on outpatient.   02/10/21- patient is improved reports feeling dizzy with ambulation.  There was no PE on Oct 2 CTPE study, she is post COVID with recurrent  micro aspiration   02/11/21- patient is improved, she is vocalizing more clearly.  ENT and GI evaluation in process.  From pulmonary perspective patient is cleared for dc home. I would continue prednisone 20 mg for 7 more days then 10 mg for 3 days and 30m for 3 days then dc. Can follow up on outpatient.   PAST MEDICAL HISTORY   Past Medical History:  Diagnosis Date  . Anxiety   . Arthritis    KNEE RIGHT  . Breast cancer ([facility] 1997   right breast lumpectomy with radiation  . [place]   INVASIVE MAMMARY CARCINOMA/ triple negative  . 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[facility] 11/10/[place]  . Cough 03/02/2017   INTERMTTENT DRY COUGH  . Depression   . GERD (gastroesophageal reflux disease)   . History of hiatal hernia   . Personal history of chemotherapy   . Personal history of radiation therapy   . Skin cancer   . [place]   [place]     SURGICAL HISTORY   Past Surgical History:  Procedure Laterality Date  . BREAST BIOPSY Right 1997   positive  . BREAST BIOPSY Right 11/15/2017   INVASIVE MAMMARY CARCINOMA triple negative  . BREAST BIOPSY Right 07/20/2020  [place] bx, Q marker, path pending  . BREAST LUMPECTOMY Right 1997   2019 also  . BREAST LUMPECTOMY WITH SENTINEL LYMPH NODE BIOPSY Right 12/08/2017   Procedure: BREAST LUMPECTOMY WITH SENTINEL LYMPH NODE BX;  Surgeon: Robert Bellow, MD;  Location: [facility] ORS;  Service: General;  Laterality: Right;  . BREAST REDUCTION WITH MASTOPEXY Left 06/10/2019   Procedure: Left breast mastopexy reduction for symmetry;  Surgeon: Wallace Going, DO;  Location: [facility] [place] [facility] [place];  Service: Plastics;  Laterality: Left;  total 2.5 hours  . COLONOSCOPY  2015  . COLONOSCOPY WITH  PROPOFOL N/A 10/29/2019   Procedure: COLONOSCOPY WITH PROPOFOL;  Surgeon: Lucilla Lame, MD;  Location: [facility] ENDOSCOPY;  Service: Endoscopy;  Laterality: N/A;  . ESOPHAGOGASTRODUODENOSCOPY (EGD) WITH PROPOFOL N/A 05/24/2019   Procedure: ESOPHAGOGASTRODUODENOSCOPY (EGD) WITH PROPOFOL;  Surgeon: Lucilla Lame, MD;  Location: [facility] ENDOSCOPY;  Service: Endoscopy;  Laterality: N/A;  . ESOPHAGUS SURGERY    . KNEE SURGERY Right   . LIPOSUCTION WITH LIPOFILLING Right 06/10/2019   Procedure: right breast scar and capsule release with fat grafting;  Surgeon: Wallace Going, DO;  Location: [place];  Service: Plastics;  Laterality: Right;  . PORT-A-CATH REMOVAL    . PORTACATH PLACEMENT Right 01/17/2018   Procedure: INSERTION PORT-A-CATH- RIGHT;  Surgeon: Robert Bellow, MD;  Location: [facility] ORS;  Service: General;  Laterality: Right;  . ROBOTIC ADRENALECTOMY Right 12/28/2016   Procedure: ROBOTIC ADRENALECTOMY;  Surgeon: Hollice Espy, MD;  Location: [facility] ORS;  Service: Urology;  Laterality: Right;  . SKIN CANCER EXCISION    . THROAT SURGERY  1998   throat cancer   . TUBAL LIGATION    . VENTRAL HERNIA REPAIR N/A 03/03/2017   Procedure: HERNIA REPAIR VENTRAL ADULT;  Surgeon: Clayburn Pert, MD;  Location: [facility] ORS;  Service: General;  Laterality: N/A;     FAMILY HISTORY   Family History  Problem Relation Age of Onset  . Diabetes Mother   . Heart attack Mother   . Cancer Sister        cancer of eyes, spread to lung, other places. died at 29  . Cancer Sister 69       unknown primary, spread to bone, brain died in 75's  . Cancer Paternal Aunt        unk type  . Cancer Paternal Grandmother        type unk  . Prostate cancer Neg Hx   . Kidney cancer Neg Hx   . Bladder Cancer Neg Hx      SOCIAL HISTORY   Social History   Tobacco Use  . Smoking status: Former    Packs/day: 1.50    Years: 30.00    Pack years: 45.00    Types: Cigarettes    Quit date: 05/03/1995     Years since quitting: 25.7  . Smokeless tobacco: Never  Vaping Use  . Vaping Use: Never used  Substance Use Topics  . Alcohol use: Yes    Alcohol/week: 4.0 standard drinks    Types: 4 Shots of liquor per week    Comment: 2 per night on weekends  . Drug use: No     MEDICATIONS    Home Medication:    Current Medication:  Current Facility-Administered Medications:  .  albuterol (VENTOLIN HFA) 108 (90 Base) MCG/ACT inhaler 2 puff, 2 puff, Inhalation, Q4H PRN, [name], MD .  alum & mag hydroxide-simeth (MAALOX/MYLANTA) 200-200-20 MG/5ML suspension 30 mL, 30 mL, Oral,  Q4H PRN, [name], MD .  aspirin EC tablet 81 mg, 81 mg, Oral, Daily, Para Skeans, MD, 81 mg at 02/10/21 0939 .  atorvastatin (LIPITOR) tablet 40 mg, 40 mg, Oral, Daily, Para Skeans, MD, 40 mg at 02/10/21 0938 .  busPIRone (BUSPAR) tablet 5 mg, 5 mg, Oral, BID, Para Skeans, MD, 5 mg at 02/10/21 2202 .  cyclobenzaprine (FLEXERIL) tablet 5 mg, 5 mg, Oral, TID PRN, Nolberto Hanlon, MD, 5 mg at 02/10/21 0834 .  DULoxetine (CYMBALTA) DR capsule 60 mg, 60 mg, Oral, Daily, Para Skeans, MD, 60 mg at 02/10/21 0938 .  enoxaparin (LOVENOX) injection 40 mg, 40 mg, Subcutaneous, Q24H, [name], MD, 40 mg at 02/10/21 2201 .  feeding supplement (NEPRO CARB STEADY) liquid 237 mL, 237 mL, Oral, TID WC, [name], MD, Stopped at 02/11/21 0201 .  fluconazole (DIFLUCAN) tablet 100 mg, 100 mg, Oral, Daily, [name], MD, 100 mg at 02/10/21 7371 .  fluticasone (FLONASE) 50 MCG/ACT nasal spray 1 spray, 1 spray, Each Nare, QHS, Para Skeans, MD, 1 spray at 02/10/21 2202 .  gabapentin (NEURONTIN) capsule 300 mg, 300 mg, Oral, TID, Para Skeans, MD, 300 mg at 02/10/21 2202 .  guaiFENesin-dextromethorphan (ROBITUSSIN DM) 100-10 MG/5ML syrup 5-10 mL, 5-10 mL, Oral, Q4H PRN, Nolberto Hanlon, MD, 10 mL at 02/10/21 2218 .  HYDROcodone-acetaminophen (NORCO/VICODIN) 5-325 MG per tablet 1 tablet, 1 tablet,  Oral, Q4H PRN, Para Skeans, MD, 1 tablet at 02/09/21 2041 .  insulin aspart (novoLOG) injection 0-9 Units, 0-9 Units, Subcutaneous, TID WC, [name], MD, 5 Units at 02/10/21 1758 .  Ipratropium-Albuterol (COMBIVENT) respimat 1 puff, 1 puff, Inhalation, Q6H, [name], MD, 1 puff at 02/10/21 2206 .  lactulose (CHRONULAC) 10 GM/15ML solution 30 g, 30 g, Oral, Daily PRN, [name], MD, 30 g at 02/09/21 1759 .  [COMPLETED] alum & mag hydroxide-simeth (MAALOX/MYLANTA) 200-200-20 MG/5ML suspension 30 mL, 30 mL, Oral, Once, 30 mL at 02/07/21 1521 **AND** lidocaine (XYLOCAINE) 2 % viscous mouth solution 15 mL, 15 mL, Oral, Q3H PRN, Ottie Glazier, MD, 15 mL at 02/08/21 2101 .  multivitamin with minerals tablet 1 tablet, 1 tablet, Oral, Daily, [name], MD, 1 tablet at 02/10/21 1215 .  oxybutynin (DITROPAN-XL) 24 hr tablet 15 mg, 15 mg, Oral, Daily, Para Skeans, MD, 15 mg at 02/10/21 0938 .  pantoprazole (PROTONIX) EC tablet 40 mg, 40 mg, Oral, BID, [name], MD, 40 mg at 02/10/21 2202 .  polyethylene glycol (MIRALAX / GLYCOLAX) packet 17 g, 17 g, Oral, Daily PRN, [name], MD .  predniSONE (DELTASONE) tablet 20 mg, 20 mg, Oral, Q breakfast, Wouk, Ailene Rud, MD .  rOPINIRole (REQUIP) tablet 0.25 mg, 0.25 mg, Oral, QHS, Amery, Sahar, MD, 0.25 mg at 02/10/21 2202 .  senna-docusate (Senokot-S) tablet 1 tablet, 1 tablet, Oral, BID, [name], MD, 1 tablet at 02/10/21 2202 .  zolpidem (AMBIEN) tablet 5 mg, 5 mg, Oral, QHS, [name], MD, 5 mg at 02/10/21 2202  Facility-Administered Medications Ordered in Other Encounters:  .  cyanocobalamin ((VITAMIN B-12)) injection 1,000 mcg, 1,000 mcg, Intramuscular, Q30 days, Sindy Guadeloupe, MD, 1,000 mcg at 09/28/18 1013 .  cyanocobalamin ((VITAMIN B-12)) injection 1,000 mcg, 1,000 mcg, Intramuscular, Q30 days, Sindy Guadeloupe, MD, 1,000 mcg at 11/30/18 1355 .  cyanocobalamin ((VITAMIN B-12))  injection 1,000 mcg, 1,000 mcg, Intramuscular, Q30 days, Sindy Guadeloupe, MD,  1,000 mcg at 08/16/19 1144    ALLERGIES   Sulfa antibiotics     REVIEW OF SYSTEMS    Review of Systems:  Gen:  Denies  fever, sweats, chills weigh loss  HEENT: Denies blurred vision, double vision, ear pain, eye pain, hearing loss, nose bleeds, sore throat Cardiac:  No dizziness, chest pain or heaviness, chest tightness,edema Resp:   Denies cough or sputum porduction, shortness of breath,wheezing, hemoptysis,  Gi: Denies swallowing difficulty, stomach pain, nausea or vomiting, diarrhea, constipation, bowel incontinence Gu:  Denies bladder incontinence, burning urine Ext:   Denies Joint pain, stiffness or swelling Skin: Denies  skin rash, easy bruising or bleeding or hives Endoc:  Denies polyuria, polydipsia , polyphagia or weight change Psych:   Denies depression, insomnia or hallucinations   Other:  All other systems negative   VS: BP 118/85 (BP Location: Left Wrist)   Pulse 75   Temp 98 F (36.7 C) (Oral)   Resp 16   Ht _0  (1.549 m)   Wt 62 kg   SpO2 97%   BMI 25.83 kg/m      PHYSICAL EXAM    GENERAL:NAD, no fevers, chills, no weakness no fatigue HEAD: Normocephalic, atraumatic.  EYES: Pupils equal, round, reactive to light. Extraocular muscles intact. No scleral icterus.  MOUTH: Moist mucosal membrane. Dentition intact. No abscess noted.  EAR, NOSE, THROAT: Clear without exudates. No external lesions.  NECK: Supple. No thyromegaly. No nodules. No JVD.  PULMONARY: Diffuse coarse rhonchi right sided +wheezes CARDIOVASCULAR: S1 and S2. Regular rate and rhythm. No murmurs, rubs, or gallops. No edema. Pedal pulses 2+ bilaterally.  GASTROINTESTINAL: Soft, nontender, nondistended. No masses. Positive bowel sounds. No hepatosplenomegaly.  MUSCULOSKELETAL: No swelling, clubbing, or edema. Range of motion full in all extremities.  NEUROLOGIC: Cranial nerves II through XII are intact. No  gross focal neurological deficits. Sensation intact. Reflexes intact.  SKIN: No ulceration, lesions, rashes, or cyanosis. Skin warm and dry. Turgor intact.  PSYCHIATRIC: Mood, affect within normal limits. The patient is awake, alert and oriented x 3. Insight, judgment intact.       IMAGING    DG Chest 2 View  Result Date: 01/29/2021 CLINICAL DATA:  Cough, body aches, COVID-19 positive EXAM: CHEST - 2 VIEW COMPARISON:  06/08/2020 CT 10/31/2018 FINDINGS: Frontal and lateral views of the chest demonstrate an unremarkable cardiac silhouette. Stable 1.5 cm left lower lobe pulmonary nodule. The remaining small nodule seen on recent chest CT are not readily apparent by x-ray. No acute airspace disease, effusion, or pneumothorax. Minimal scarring at the left lung base. No acute bony abnormality. IMPRESSION: 1. No acute intrathoracic process. 2. Stable 1.5 cm left lower lobe pulmonary nodule, unchanged since 2020. The remaining pulmonary nodule seen on the recent chest CT 08/21/2020 are not readily apparent by x-ray. Electronically Signed   By: Randa Ngo M.D.   On: 01/29/2021 16:47   CT Angio Chest PE W and/or Wo Contrast  Result Date: 01/31/2021 CLINICAL DATA:  Ongoing symptoms of COVID. EXAM: CT ANGIOGRAPHY CHEST WITH CONTRAST TECHNIQUE: Multidetector CT imaging of the chest was performed using the standard protocol during bolus administration of intravenous contrast. Multiplanar CT image reconstructions and MIPs were obtained to evaluate the vascular anatomy. CONTRAST:  29m OMNIPAQUE IOHEXOL 350 MG/ML SOLN COMPARISON:  August 21, 2020 FINDINGS: Cardiovascular: There is moderate severity calcification of the aortic arch, without evidence of aortic aneurysm or dissection. Satisfactory opacification of the pulmonary arteries to the segmental level. No evidence of pulmonary  embolism. Normal heart size. No pericardial effusion. Mediastinum/Nodes: No enlarged mediastinal, hilar, or axillary lymph nodes.  Thyroid gland, trachea, and esophagus demonstrate no significant findings. Lungs/Pleura: Mild atelectasis is seen within the posterior aspects of the bilateral lung left greater than right. There is no evidence of acute infiltrate, pleural effusion or pneumothorax. Upper Abdomen: There is a small hiatal hernia. A 1.3 cm diameter simple cyst is seen along the anterior aspect of the upper pole of the left kidney. Musculoskeletal: A 4.9 cm x 2.3 cm lipoma is seen along the anterolateral aspect of the lower right chest wall. No acute osseous abnormalities are identified. Review of the MIP images confirms the above findings. IMPRESSION: 1. No CT evidence of pulmonary embolism or acute cardiopulmonary disease. 2. Small hiatal hernia. 3. 4.9 cm x 2.3 cm lower right chest wall lipoma. 4. Aortic atherosclerosis. Aortic Atherosclerosis (ICD10-I70.0). Electronically Signed   By: Virgina Norfolk M.D.   On: 01/31/2021 20:23    Media Information Document Information  Photos    02/06/2021 13:27  Attached To:  Hospital Encounter on 01/31/21   Source Information  Ottie Glazier, MD  [facility] (1c)    ASSESSMENT/PLAN   Chronic COPD with chronic bronchitis -She is on pred 40 bid which should help with inflammatory changes -will swab for non covid RVP virus to see if other infections are overlying -agree with [facility] and Combivent -will ask if mucomyst could be delivered by RT but unsure if this can be done while on covid precautions   Atelectasis of LLL Continue use of IS - currently at 750cc/h -mucinex   GERD with hiatal hernia  S/p GI and ENT evaluation -Phenol spray QID and viscous lidocaine   COVID19 infection    - can check cycle threshold value with lab to see if its active if >35 then inactive old infection     - CRP/ferritin/ESR/MRSA pcr/ Procalcitonin  -continue current mgt     Thank you for allowing me to participate in the care of this patient.     Patient/Family are  satisfied with care plan and all questions have been answered.  This document was prepared using Dragon voice recognition software and may include unintentional dictation errors.     Ottie Glazier, M.D.  Division of [place]

## 2021-02-11 NOTE — TOC Transition Note (Signed)
Transition of Care [facility]) - CM/SW Discharge Note   Patient Details  Name: Denise Watts MRN: [id number] Date of Birth: [birth year]  Transition of Care [facility]) CM/SW Contact:  Shelbie Hutching, RN Phone Number: 02/11/2021, 12:19 PM   Clinical Narrative:    Patient is medically cleared for discharge home today with home health services.  [facility] has accepted referral for speech and PT, Alex with [facility] is aware of DC today.  Patient's husband will be picking her up.     Final next level of care: [place] Barriers to Discharge: Barriers Resolved   Patient Goals and CMS Choice Patient states their goals for this hospitalization and ongoing recovery are:: Glad to be going home CMS Medicare.gov Compare Post Acute Care list provided to:: Patient Choice offered to / list presented to : Patient  Discharge Placement                       Discharge Plan and Services In-house Referral: NA Discharge Planning Services: CM Consult Post Acute Care Choice: Home Health          DME Arranged: N/A DME Agency: NA       HH Arranged: PT, Speech Therapy HH Agency: [facility] [place] Date [place] Agency Contacted: 02/11/21 Time HH Agency Contacted: 0900 Representative spoke with at [place]: [place] Determinants of Health ([place]) Interventions     Readmission Risk Interventions No flowsheet data found.

## 2021-02-11 NOTE — Progress Notes (Signed)
Discharge instructions provided to patient and her husband. Patient verbalized an understanding of all instructions including medications. Patient husband will provide transport.

## 2021-02-11 NOTE — Progress Notes (Signed)
Patient was walking around a lot before getting vitals.

## 2021-02-11 NOTE — Discharge Summary (Signed)
Denise Watts IWP:[id number] DOB: [birth year] DOA: 01/31/2021  PCP: Jerrol Banana., MD  Admit date: 01/31/2021 Discharge date: 02/11/2021  Time spent: 45 minutes  Recommendations for Outpatient Follow-up:  Pcp f/u Pulmonology f/u GI f/u     Discharge Diagnoses:  Principal Problem:   SOB (shortness of breath) Active Problems:   Acid reflux   Anxiety and depression   Centrilobular emphysema ([place])   Acute hypoxemic respiratory failure due to COVID-19 [facility])   Acute respiratory failure with hypoxia [facility])   Discharge Condition: stable  Diet recommendation: heart healthy  Filed Weights   01/31/21 1903  Weight: 62 kg    History of present illness:  Denise Watts is a 65 y.o. female seen in ed with complaints of seen in the emergency room for complaints of shortness of breath.  Patient had COVID symptoms with cough congestion for about 10 days now and was diagnosed with COVID on multiple home test and was seen in the emergency room on 01/27/2021 was diagnosed with COVID as well. Patient denies any chest pain fevers chills nausea vomiting diarrhea abdominal issues headaches vision gait or speech issues and review of systems is otherwise negative except for  Grandchildren and husband have been sick.Worsening sob since past few days.    Pt has past medical history of allergy to sulfa antibiotics, emphysema, anxiety, dysphagia, B12 and vitamin D deficiency, right adrenal mass, history of tobacco abuse, dyslipidemia.  Hospital Course:  Patient presented with shortness of breath in the setting of covid. She was hypoxic, which resolved. She was treated with remdesevir and steroids. Pulmonology followed, advising steroid taper which has been prescribed. She was evaluated by GI and ENT for persistent dysphagia. She has a hx of laryngeal cancer and esophageal stricture that has been dilated in the past. ENT/GI think her dysphagia is stable and advised no intervention inpatient but did advise  outpatient f/u with GI to consider repeat EGD with dilation. She was diagnosed with oral thrush and will complete course of fluconazole.   Procedures: none3  Consultations: Pulm, GI, ENT  Discharge Exam: Vitals:   02/11/21 0319 02/11/21 0726  BP: 112/79 118/85  Pulse: 79 75  Resp: 16 16  Temp: (!) 97.3 F (36.3 C) 98 F (36.7 C)  SpO2: 96% 97%    General: NAD Lungs: Distant breath sounds with poor air movement th/o all fields, no expiratory wheezing , no focal crackles  Cardiovascular: RRR w/o M or rub  Abdomen: NT/ND, soft, bowel sounds positive, no rebound, no ascites Extremities: No C/C/E B LE  Discharge Instructions   Discharge Instructions     Diet - low sodium heart healthy   Complete by: As directed    Increase activity slowly   Complete by: As directed       Allergies as of 02/11/2021       Reactions   Sulfa Antibiotics Other (See Comments)   Mouth sore and raw        Medication List     TAKE these medications    albuterol 108 (90 Base) MCG/ACT inhaler Commonly known as: VENTOLIN HFA Inhale 2 puffs into the lungs every 6 (six) hours as needed for shortness of breath.   aspirin EC 81 MG tablet Take 1 tablet (81 mg total) by mouth daily. Swallow whole.   atorvastatin 40 MG tablet Commonly known as: LIPITOR Take 1 tablet (40 mg total) by mouth daily.   benzonatate 100 MG capsule Commonly known as: Tessalon Perles Take 1-2  tabs TID prn cough   Breztri Aerosphere 160-9-4.8 MCG/ACT Aero Generic drug: Budeson-Glycopyrrol-Formoterol Inhale 2 puffs into the lungs in the morning and at bedtime.   brompheniramine-pseudoephedrine-DM 30-2-10 MG/5ML syrup Take 5 mLs by mouth 4 (four) times daily as needed.   busPIRone 5 MG tablet Commonly known as: BUSPAR Take 1 tablet (5 mg total) by mouth 2 (two) times daily.   cyclobenzaprine 5 MG tablet Commonly known as: FLEXERIL TAKE 1 TABLET BY MOUTH 3 TIMES DAILY AS NEEDED FOR MUSCLE SPASMS    DULoxetine 60 MG capsule Commonly known as: CYMBALTA Take by mouth.   fluconazole 100 MG tablet Commonly known as: DIFLUCAN Take 1 tablet (100 mg total) by mouth daily. Start taking on: February 12, 2021   fluticasone 50 MCG/ACT nasal spray Commonly known as: FLONASE Place 1 spray into both nostrils at bedtime.   gabapentin 300 MG capsule Commonly known as: NEURONTIN TAKE 1 CAPSULE BY MOUTH 3 TIMES A DAY   HYDROcodone-acetaminophen 5-325 MG tablet Commonly known as: NORCO/VICODIN Take 1 tablet by mouth every 4 (four) hours as needed for moderate pain.   omeprazole 40 MG capsule Commonly known as: PRILOSEC Take 40 mg by mouth in the morning and at bedtime.   ondansetron 4 MG disintegrating tablet Commonly known as: Zofran ODT Take 1 tablet (4 mg total) by mouth every 8 (eight) hours as needed.   ondansetron 4 MG tablet Commonly known as: Zofran Take 1 tablet (4 mg total) by mouth every 8 (eight) hours as needed for nausea or vomiting.   oxybutynin 15 MG 24 hr tablet Commonly known as: DITROPAN XL Take 1 tablet (15 mg total) by mouth daily.   predniSONE 5 MG tablet Commonly known as: DELTASONE Take 1 tablet (5 mg total) by mouth daily. 4 tabs daily for seven days, then 2 tabs daily for three days, then 1 tab daily for three days What changed:  medication strength how much to take when to take this additional instructions   rOPINIRole 0.25 MG tablet Commonly known as: REQUIP TAKE 1 TO 2 TABLETS BY MOUTH AT BEDTIME FOR RESTLESS LEGS       Allergies  Allergen Reactions   Sulfa Antibiotics Other (See Comments)    Mouth sore and raw    Follow-up Information     Jerrol Banana., MD. Schedule an appointment as soon as possible for a visit.   Specialty: Family Medicine Contact information: [address] [place] [address] [phone]         Ottie Glazier, MD. Schedule an appointment as soon as possible for a visit.   Specialty:  Pulmonary Disease Contact information: [place] [address] [phone]         Lucilla Lame, MD. Schedule an appointment as soon as possible for a visit.   Specialty: Gastroenterology Contact information: [place]  [place] [address] [phone]                  The results of significant diagnostics from this hospitalization (including imaging, microbiology, ancillary and laboratory) are listed below for reference.    Significant Diagnostic Studies: DG Chest 2 View  Result Date: 01/29/2021 CLINICAL DATA:  Cough, body aches, COVID-19 positive EXAM: CHEST - 2 VIEW COMPARISON:  06/08/2020 CT 10/31/2018 FINDINGS: Frontal and lateral views of the chest demonstrate an unremarkable cardiac silhouette. Stable 1.5 cm left lower lobe pulmonary nodule. The remaining small nodule seen on recent chest CT are not readily apparent  by x-ray. No acute airspace disease, effusion, or pneumothorax. Minimal scarring at the left lung base. No acute bony abnormality. IMPRESSION: 1. No acute intrathoracic process. 2. Stable 1.5 cm left lower lobe pulmonary nodule, unchanged since 2020. The remaining pulmonary nodule seen on the recent chest CT 08/21/2020 are not readily apparent by x-ray. Electronically Signed   By: Randa Ngo M.D.   On: 01/29/2021 16:47   CT Angio Chest PE W and/or Wo Contrast  Result Date: 01/31/2021 CLINICAL DATA:  Ongoing symptoms of COVID. EXAM: CT ANGIOGRAPHY CHEST WITH CONTRAST TECHNIQUE: Multidetector CT imaging of the chest was performed using the standard protocol during bolus administration of intravenous contrast. Multiplanar CT image reconstructions and MIPs were obtained to evaluate the vascular anatomy. CONTRAST:  40mL OMNIPAQUE IOHEXOL 350 MG/ML SOLN COMPARISON:  August 21, 2020 FINDINGS: Cardiovascular: There is moderate severity calcification of the aortic arch, without evidence of aortic aneurysm or dissection. Satisfactory  opacification of the pulmonary arteries to the segmental level. No evidence of pulmonary embolism. Normal heart size. No pericardial effusion. Mediastinum/Nodes: No enlarged mediastinal, hilar, or axillary lymph nodes. Thyroid gland, trachea, and esophagus demonstrate no significant findings. Lungs/Pleura: Mild atelectasis is seen within the posterior aspects of the bilateral lung left greater than right. There is no evidence of acute infiltrate, pleural effusion or pneumothorax. Upper Abdomen: There is a small hiatal hernia. A 1.3 cm diameter simple cyst is seen along the anterior aspect of the upper pole of the left kidney. Musculoskeletal: A 4.9 cm x 2.3 cm lipoma is seen along the anterolateral aspect of the lower right chest wall. No acute osseous abnormalities are identified. Review of the MIP images confirms the above findings. IMPRESSION: 1. No CT evidence of pulmonary embolism or acute cardiopulmonary disease. 2. Small hiatal hernia. 3. 4.9 cm x 2.3 cm lower right chest wall lipoma. 4. Aortic atherosclerosis. Aortic Atherosclerosis (ICD10-I70.0). Electronically Signed   By: Virgina Norfolk M.D.   On: 01/31/2021 20:23    Microbiology: Recent Results (from the past 240 hour(s))  Respiratory (~20 pathogens) panel by PCR     Status: None   Collection Time: 02/06/21  1:40 PM   Specimen: Nasopharyngeal Swab; Respiratory  Result Value Ref Range Status   Adenovirus NOT DETECTED NOT DETECTED Final   Coronavirus 229E NOT DETECTED NOT DETECTED Final    Comment: (NOTE) The Coronavirus on the Respiratory Panel, DOES NOT test for the novel  Coronavirus (2019 nCoV)    Coronavirus HKU1 NOT DETECTED NOT DETECTED Final   Coronavirus NL63 NOT DETECTED NOT DETECTED Final   Coronavirus OC43 NOT DETECTED NOT DETECTED Final   Metapneumovirus NOT DETECTED NOT DETECTED Final   Rhinovirus / Enterovirus NOT DETECTED NOT DETECTED Final   Influenza A NOT DETECTED NOT DETECTED Final   Influenza B NOT DETECTED NOT  DETECTED Final   Parainfluenza Virus 1 NOT DETECTED NOT DETECTED Final   Parainfluenza Virus 2 NOT DETECTED NOT DETECTED Final   Parainfluenza Virus 3 NOT DETECTED NOT DETECTED Final   Parainfluenza Virus 4 NOT DETECTED NOT DETECTED Final   Respiratory Syncytial Virus NOT DETECTED NOT DETECTED Final   Bordetella pertussis NOT DETECTED NOT DETECTED Final   Bordetella Parapertussis NOT DETECTED NOT DETECTED Final   Chlamydophila pneumoniae NOT DETECTED NOT DETECTED Final   Mycoplasma pneumoniae NOT DETECTED NOT DETECTED Final    Comment: Performed at [facility], [address] [place], [place] [address]  MRSA Next Gen by PCR, Nasal     Status: None  Collection Time: 02/06/21  2:50 PM   Specimen: Nasal Mucosa; Nasal Swab  Result Value Ref Range Status   MRSA by PCR Next Gen NOT DETECTED NOT DETECTED Final    Comment: (NOTE) The GeneXpert MRSA Assay (FDA approved for NASAL specimens only), is one component of a comprehensive MRSA colonization surveillance program. It is not intended to diagnose MRSA infection nor to guide or monitor treatment for MRSA infections. Test performance is not FDA approved in patients less than 12 years old. Performed at [facility], [address] [place], [place] [address]      Labs: Basic Metabolic Panel: Recent Labs  Lab 02/07/21 0401 02/11/21 0434  NA 139 136  K 4.6 4.9  CL 100 98  CO2 29 34*  GLUCOSE 165* 101*  BUN 19 30*  CREATININE 0.87 0.85  CALCIUM 8.9 8.8*   Liver Function Tests: No results for input(s): AST, ALT, ALKPHOS, BILITOT, PROT, ALBUMIN in the last 168 hours. No results for input(s): LIPASE, AMYLASE in the last 168 hours. No results for input(s): AMMONIA in the last 168 hours. CBC: Recent Labs  Lab 02/07/21 0401  WBC 9.6  HGB 13.2  HCT 40.0  MCV 98.0  PLT 213   Cardiac Enzymes: No results for input(s): CKTOTAL, CKMB, CKMBINDEX, TROPONINI in the last 168 hours. BNP: BNP (last 3 results) No  results for input(s): BNP in the last 8760 hours.  ProBNP (last 3 results) No results for input(s): PROBNP in the last 8760 hours.  CBG: Recent Labs  Lab 02/10/21 1136 02/10/21 1735 02/11/21 0728 02/11/21 0842 02/11/21 1128  GLUCAP 160* 276* 67* 77 169*       Signed:  Desma Maxim MD.  [facility] 02/11/2021, 11:38 AM

## 2021-02-12 DIAGNOSIS — J432 Centrilobular emphysema: Secondary | ICD-10-CM | POA: Diagnosis not present

## 2021-02-12 DIAGNOSIS — G545 Neuralgic amyotrophy: Secondary | ICD-10-CM | POA: Diagnosis not present

## 2021-02-12 DIAGNOSIS — F329 Major depressive disorder, single episode, unspecified: Secondary | ICD-10-CM | POA: Diagnosis not present

## 2021-02-12 DIAGNOSIS — M1711 Unilateral primary osteoarthritis, right knee: Secondary | ICD-10-CM | POA: Diagnosis not present

## 2021-02-12 DIAGNOSIS — U071 COVID-19: Secondary | ICD-10-CM | POA: Diagnosis not present

## 2021-02-12 DIAGNOSIS — M47812 Spondylosis without myelopathy or radiculopathy, cervical region: Secondary | ICD-10-CM | POA: Diagnosis not present

## 2021-02-12 DIAGNOSIS — G8929 Other chronic pain: Secondary | ICD-10-CM | POA: Diagnosis not present

## 2021-02-12 DIAGNOSIS — M545 Low back pain, unspecified: Secondary | ICD-10-CM | POA: Diagnosis not present

## 2021-02-12 DIAGNOSIS — G629 Polyneuropathy, unspecified: Secondary | ICD-10-CM | POA: Diagnosis not present

## 2021-02-16 ENCOUNTER — Encounter: Payer: Self-pay | Admitting: Physician Assistant

## 2021-02-16 ENCOUNTER — Other Ambulatory Visit: Payer: Self-pay

## 2021-02-16 ENCOUNTER — Encounter: Payer: Medicare HMO | Admitting: Physical Therapy

## 2021-02-16 ENCOUNTER — Ambulatory Visit (INDEPENDENT_AMBULATORY_CARE_PROVIDER_SITE_OTHER): Payer: Medicare HMO | Admitting: Physician Assistant

## 2021-02-16 VITALS — BP 141/81 | HR 87 | Temp 97.2°F | Resp 16 | Wt 138.8 lb

## 2021-02-16 DIAGNOSIS — J432 Centrilobular emphysema: Secondary | ICD-10-CM | POA: Diagnosis not present

## 2021-02-16 DIAGNOSIS — R0602 Shortness of breath: Secondary | ICD-10-CM

## 2021-02-16 DIAGNOSIS — R1310 Dysphagia, unspecified: Secondary | ICD-10-CM

## 2021-02-16 DIAGNOSIS — R053 Chronic cough: Secondary | ICD-10-CM

## 2021-02-16 MED ORDER — BENZONATATE 100 MG PO CAPS: ORAL_CAPSULE | ORAL | 0 refills | Status: DC

## 2021-02-16 NOTE — Assessment & Plan Note (Signed)
W/ activity, not at rest. O2 today 97%.  Encouraged pt to rest and not hesitate to take albuterol inhaler if needed. Ensure adequate fluid intake. Appt w/ pulm.

## 2021-02-16 NOTE — Assessment & Plan Note (Addendum)
Exacerbated secondary to hospitalization for emphysema and COVID 19. Continue Robitussin DM, Tessalon. Continue prilosec. Already had appt scheduled w/ Pulm and GI.

## 2021-02-16 NOTE — Assessment & Plan Note (Signed)
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  Continues to have meals that are soft, liquid.  Appt already scheduled w/ GI. Continue Prilosec

## 2021-02-16 NOTE — Progress Notes (Signed)
Established patient visit   Patient: Denise Watts   DOB: [birth year]   65 y.o. Female  MRN: [id number] Visit Date: 02/16/2021  Today's healthcare provider: Mikey Kirschner, PA-C   Chief Complaint  Patient presents with   Hospitalization Follow-up   Subjective    HPI  Follow up Hospitalization  Patient was admitted to [facility] on 01/31/21 and discharged on 02/11/21. She was treated for acute hypoxemic respiratory failure d/t covid 19 complicated by emphysema. Treatment for this included in hospital patient treated with Remdesevir and steroids. She was sent home w/ inhalers, a steroid dose pack, and multiple cough medications. She reports excellent compliance with treatment. Today she reports continued SOB with activity, and that she frequently needs to stop and catch her breath. Reports one episode of SOB w/ heat and redness in her face that lasted a few minutes. Denies current fever, dizziness, CP, current SOB.  ----------------------------------------------------------------------------------------- -     Medications: Outpatient Medications Prior to Visit  Medication Sig   albuterol (VENTOLIN HFA) 108 (90 Base) MCG/ACT inhaler Inhale 2 puffs into the lungs every 6 (six) hours as needed for shortness of breath.   aspirin EC 81 MG tablet Take 1 tablet (81 mg total) by mouth daily. Swallow whole.   atorvastatin (LIPITOR) 40 MG tablet Take 1 tablet (40 mg total) by mouth daily.   brompheniramine-pseudoephedrine-DM 30-2-10 MG/5ML syrup Take 5 mLs by mouth 4 (four) times daily as needed.   Budeson-Glycopyrrol-Formoterol (BREZTRI AEROSPHERE) 160-9-4.8 MCG/ACT AERO Inhale 2 puffs into the lungs in the morning and at bedtime.   busPIRone (BUSPAR) 5 MG tablet Take 1 tablet (5 mg total) by mouth 2 (two) times daily.   cyclobenzaprine (FLEXERIL) 5 MG tablet TAKE 1 TABLET BY MOUTH 3 TIMES DAILY AS NEEDED FOR MUSCLE SPASMS   DULoxetine (CYMBALTA) 60 MG capsule Take by mouth.   fluconazole  (DIFLUCAN) 100 MG tablet Take 1 tablet (100 mg total) by mouth daily.   fluticasone (FLONASE) 50 MCG/ACT nasal spray Place 1 spray into both nostrils at bedtime.   gabapentin (NEURONTIN) 300 MG capsule TAKE 1 CAPSULE BY MOUTH 3 TIMES A DAY   HYDROcodone-acetaminophen (NORCO/VICODIN) 5-325 MG tablet Take 1 tablet by mouth every 4 (four) hours as needed for moderate pain.   omeprazole (PRILOSEC) 40 MG capsule Take 40 mg by mouth in the morning and at bedtime.   ondansetron (ZOFRAN ODT) 4 MG disintegrating tablet Take 1 tablet (4 mg total) by mouth every 8 (eight) hours as needed.   ondansetron (ZOFRAN) 4 MG tablet Take 1 tablet (4 mg total) by mouth every 8 (eight) hours as needed for nausea or vomiting.   oxybutynin (DITROPAN XL) 15 MG 24 hr tablet Take 1 tablet (15 mg total) by mouth daily.   predniSONE (DELTASONE) 5 MG tablet Take 1 tablet (5 mg total) by mouth daily. 4 tabs daily for seven days, then 2 tabs daily for three days, then 1 tab daily for three days   rOPINIRole (REQUIP) 0.25 MG tablet TAKE 1 TO 2 TABLETS BY MOUTH AT BEDTIME FOR RESTLESS LEGS   [DISCONTINUED] benzonatate (TESSALON PERLES) 100 MG capsule Take 1-2 tabs TID prn cough   Facility-Administered Medications Prior to Visit  Medication Dose Route Frequency Provider   cyanocobalamin ((VITAMIN B-12)) injection 1,000 mcg  1,000 mcg Intramuscular Q30 days Sindy Guadeloupe, MD   cyanocobalamin ((VITAMIN B-12)) injection 1,000 mcg  1,000 mcg Intramuscular Q30 days Sindy Guadeloupe, MD   cyanocobalamin ((VITAMIN B-12)) injection  1,000 mcg  1,000 mcg Intramuscular Q30 days Sindy Guadeloupe, MD    Review of Systems  Constitutional:  Positive for fatigue. Negative for fever.  HENT: Negative.    Respiratory:  Positive for cough and shortness of breath.   Cardiovascular:  Negative for chest pain.  Neurological:  Negative for dizziness, syncope and headaches.      Objective    BP (!) 141/81   Pulse 87   Temp (!) 97.2 F (36.2 C)  (Oral)   Resp 16   Wt 138 lb 12.8 oz (63 kg)   SpO2 97%   BMI 26.23 kg/m    Physical Exam Constitutional:      General: She is awake.     Appearance: She is well-developed.  HENT:     Head: Normocephalic.  Eyes:     Conjunctiva/sclera: Conjunctivae normal.  Cardiovascular:     Rate and Rhythm: Normal rate and regular rhythm.     Heart sounds: Normal heart sounds.  Pulmonary:     Breath sounds: Decreased air movement present. No decreased breath sounds, wheezing, rhonchi or rales.     Comments: Pt has increased effort in taking deep breaths. Chronic hoarseness d/t vocal cord ca.  Skin:    General: Skin is warm.  Neurological:     Mental Status: She is alert and oriented to person, place, and time.  Psychiatric:        Attention and Perception: Attention normal.        Mood and Affect: Mood normal.        Speech: Speech normal.        Behavior: Behavior is cooperative.      No results found for any visits on 02/16/21.  Assessment & Plan     Problem List Items Addressed This Visit       Respiratory   Centrilobular emphysema ([place])    Continue w/ inhalers rx by hospital, albuterol as needed for SOB exacerbation.  Almost finished w/ prednisone taper. Limit physical activity, rest.  Increase fluid intake. Appt scheduled w/ Pulm      Relevant Medications   benzonatate (TESSALON PERLES) 100 MG capsule     Digestive   Dysphagia    Chronic, secondary to head and neck radiation from vocal cord ca.  Continues to have meals that are soft, liquid.  Appt already scheduled w/ GI. Continue Prilosec        Other   Chronic cough - Primary    Exacerbated secondary to hospitalization for emphysema and COVID 19. Continue Robitussin DM, Tessalon. Continue prilosec. Already had appt scheduled w/ Pulm and GI.       Relevant Medications   benzonatate (TESSALON PERLES) 100 MG capsule   SOB (shortness of breath)    W/ activity, not at rest. O2 today 97%.  Encouraged pt  to rest and not hesitate to take albuterol inhaler if needed. Ensure adequate fluid intake. Appt w/ pulm.        F/u as scheduled w/ Dr Rosanna Randy, GI, and Pulmonology.     I, Mikey Kirschner, PA-C have reviewed all documentation for this visit. The documentation on 02/16/21  for the exam, diagnosis, procedures, and orders are all accurate and complete.    Mikey Kirschner, PA-C  [facility] [phone] (phone) [phone] (fax)  [place]

## 2021-02-16 NOTE — Assessment & Plan Note (Addendum)
Continue w/ inhalers rx by hospital, albuterol as needed for SOB exacerbation.  Almost finished w/ prednisone taper. Limit physical activity, rest.  Increase fluid intake. Appt scheduled w/ Pulm

## 2021-02-18 ENCOUNTER — Encounter: Payer: Medicare HMO | Admitting: Physical Therapy

## 2021-02-18 ENCOUNTER — Telehealth: Payer: Self-pay

## 2021-02-18 DIAGNOSIS — M47812 Spondylosis without myelopathy or radiculopathy, cervical region: Secondary | ICD-10-CM | POA: Diagnosis not present

## 2021-02-18 DIAGNOSIS — U071 COVID-19: Secondary | ICD-10-CM | POA: Diagnosis not present

## 2021-02-18 DIAGNOSIS — G545 Neuralgic amyotrophy: Secondary | ICD-10-CM | POA: Diagnosis not present

## 2021-02-18 DIAGNOSIS — J432 Centrilobular emphysema: Secondary | ICD-10-CM | POA: Diagnosis not present

## 2021-02-18 DIAGNOSIS — G629 Polyneuropathy, unspecified: Secondary | ICD-10-CM | POA: Diagnosis not present

## 2021-02-18 DIAGNOSIS — M1711 Unilateral primary osteoarthritis, right knee: Secondary | ICD-10-CM | POA: Diagnosis not present

## 2021-02-18 DIAGNOSIS — M545 Low back pain, unspecified: Secondary | ICD-10-CM | POA: Diagnosis not present

## 2021-02-18 DIAGNOSIS — F329 Major depressive disorder, single episode, unspecified: Secondary | ICD-10-CM | POA: Diagnosis not present

## 2021-02-18 DIAGNOSIS — G8929 Other chronic pain: Secondary | ICD-10-CM | POA: Diagnosis not present

## 2021-02-18 NOTE — Telephone Encounter (Signed)
Copied from [place] [id number]. Topic: General - Other >> Feb 18, 2021  1:10 PM Denise Watts A wrote: Reason for CRM: Soni with [facility] has called to say the patient has requested to be discharged from Physical Therapy services with [facility] today 02/18/21  Please contact further if needed

## 2021-02-19 DIAGNOSIS — U071 COVID-19: Secondary | ICD-10-CM | POA: Diagnosis not present

## 2021-02-19 DIAGNOSIS — J9601 Acute respiratory failure with hypoxia: Secondary | ICD-10-CM | POA: Diagnosis not present

## 2021-02-22 DIAGNOSIS — Z8521 Personal history of malignant neoplasm of larynx: Secondary | ICD-10-CM | POA: Diagnosis not present

## 2021-02-22 DIAGNOSIS — R1310 Dysphagia, unspecified: Secondary | ICD-10-CM | POA: Diagnosis not present

## 2021-02-22 DIAGNOSIS — U071 COVID-19: Secondary | ICD-10-CM | POA: Diagnosis not present

## 2021-02-23 ENCOUNTER — Encounter: Payer: Medicare HMO | Admitting: Physical Therapy

## 2021-02-25 ENCOUNTER — Telehealth: Payer: Self-pay | Admitting: Family Medicine

## 2021-02-25 ENCOUNTER — Encounter: Payer: Medicare HMO | Admitting: Physical Therapy

## 2021-02-25 NOTE — Telephone Encounter (Signed)
Home Health Verbal Orders - Caller/Agency: Deanna Jackquline [place] Callback Number: [phone] Requesting Plan of care / 485 Frequency: from 10.14.22 - 12.12.22

## 2021-02-26 ENCOUNTER — Other Ambulatory Visit: Payer: Self-pay

## 2021-02-26 ENCOUNTER — Encounter: Payer: Self-pay | Admitting: Oncology

## 2021-02-26 ENCOUNTER — Inpatient Hospital Stay (HOSPITAL_BASED_OUTPATIENT_CLINIC_OR_DEPARTMENT_OTHER): Payer: Medicare HMO | Admitting: Oncology

## 2021-02-26 ENCOUNTER — Inpatient Hospital Stay: Payer: Medicare HMO | Attending: Oncology

## 2021-02-26 VITALS — BP 130/83 | HR 85 | Temp 99.1°F | Resp 20 | Wt 145.0 lb

## 2021-02-26 DIAGNOSIS — Z809 Family history of malignant neoplasm, unspecified: Secondary | ICD-10-CM | POA: Insufficient documentation

## 2021-02-26 DIAGNOSIS — K760 Fatty (change of) liver, not elsewhere classified: Secondary | ICD-10-CM | POA: Insufficient documentation

## 2021-02-26 DIAGNOSIS — Z853 Personal history of malignant neoplasm of breast: Secondary | ICD-10-CM

## 2021-02-26 DIAGNOSIS — E538 Deficiency of other specified B group vitamins: Secondary | ICD-10-CM

## 2021-02-26 DIAGNOSIS — Z171 Estrogen receptor negative status [ER-]: Secondary | ICD-10-CM | POA: Insufficient documentation

## 2021-02-26 DIAGNOSIS — C50511 Malignant neoplasm of lower-outer quadrant of right female breast: Secondary | ICD-10-CM | POA: Insufficient documentation

## 2021-02-26 DIAGNOSIS — Z87891 Personal history of nicotine dependence: Secondary | ICD-10-CM | POA: Insufficient documentation

## 2021-02-26 DIAGNOSIS — C7491 Malignant neoplasm of unspecified part of right adrenal gland: Secondary | ICD-10-CM

## 2021-02-26 DIAGNOSIS — I700 Atherosclerosis of aorta: Secondary | ICD-10-CM | POA: Diagnosis not present

## 2021-02-26 DIAGNOSIS — Z08 Encounter for follow-up examination after completed treatment for malignant neoplasm: Secondary | ICD-10-CM

## 2021-02-26 LAB — CBC WITH DIFFERENTIAL/PLATELET
Abs Immature Granulocytes: 0.1 10*3/uL — ABNORMAL HIGH (ref 0.00–0.07)
Basophils Absolute: 0 10*3/uL (ref 0.0–0.1)
Basophils Relative: 0 %
Eosinophils Absolute: 0.2 10*3/uL (ref 0.0–0.5)
Eosinophils Relative: 3 %
HCT: 39.4 % (ref 36.0–46.0)
Hemoglobin: 12.6 g/dL (ref 12.0–15.0)
Immature Granulocytes: 2 %
Lymphocytes Relative: 13 %
Lymphs Abs: 0.9 10*3/uL (ref 0.7–4.0)
MCH: 32.4 pg (ref 26.0–34.0)
MCHC: 32 g/dL (ref 30.0–36.0)
MCV: 101.3 fL — ABNORMAL HIGH (ref 80.0–100.0)
Monocytes Absolute: 0.5 10*3/uL (ref 0.1–1.0)
Monocytes Relative: 8 %
Neutro Abs: 5.1 10*3/uL (ref 1.7–7.7)
Neutrophils Relative %: 74 %
Platelets: 155 10*3/uL (ref 150–400)
RBC: 3.89 MIL/uL (ref 3.87–5.11)
RDW: 14.6 % (ref 11.5–15.5)
WBC: 6.8 10*3/uL (ref 4.0–10.5)
nRBC: 0 % (ref 0.0–0.2)

## 2021-02-26 LAB — IRON AND TIBC
Iron: 74 ug/dL (ref 28–170)
Saturation Ratios: 26 % (ref 10.4–31.8)
TIBC: 283 ug/dL (ref 250–450)
UIBC: 209 ug/dL

## 2021-02-26 LAB — FERRITIN: Ferritin: 59 ng/mL (ref 11–307)

## 2021-02-26 LAB — VITAMIN B12: Vitamin B-12: 209 pg/mL (ref 180–914)

## 2021-02-26 NOTE — Progress Notes (Signed)
Hematology/Oncology Consult note [facility]  [phone] [phone]  Patient Care Team: Jerrol Banana., MD as PCP - General (Family Medicine) Sindy Guadeloupe, MD as Consulting Physician (Oncology) Jules Husbands, MD as Consulting Physician (General Surgery) Dasher, Rayvon Char, MD (Dermatology) Scheeler, Carola Rhine, PA-C as Physician Assistant (Plastic Surgery) Harvest Dark, MD as Attending Physician (Emergency Medicine) Gayland Curry Hubbard Hartshorn (Neurology) Marlowe Sax, MD as Referring Physician (Internal Medicine) Chesley Mires, MD as Consulting Physician (Pulmonary Disease)   Name of the patient: Denise Watts  [id number]  [birth year]   Date of visit: 02/26/21  Diagnosis-  1. H/o breast cancer in 1997 2. H/o stage I SCC of vocal cord in 2015. 3. Lung nodules 4. Adrenal carcinoma of unknown primary s/p resection 5. Stage IaT1a NxcM0 ER weakly positive 1-10%, PR negative and her 2 negative    Chief complaint/ Reason for visit-routine follow-up of breast cancer  Heme/Onc history:  Oncology History Overview Note  1. Carcinoma of breast, T1, N0, M0 tumor diagnosis.  In January of 1997 2. Carcinoma of the vocal cord T1, N0, M0 tumor.  Status postradiation therapy 3. Abnormal CT scan of the chest (December, 2015) 4. Repeat CT scan of chest shows stable nodule (July, 2016) 5. Neutropenia with normal hemoglobin and normal platelet count (July, 2016)   Cancer of vocal cord [facility])  11/10/2014 Initial Diagnosis   Cancer of vocal cord   Malignant neoplasm of lower-outer quadrant of right breast of female, estrogen receptor negative ([place])  11/24/2017 Initial Diagnosis   Malignant neoplasm of lower-outer quadrant of right breast of female, estrogen receptor negative ([place])   01/12/2018 Cancer Staging   Staging form: Breast, AJCC 8th Edition - Clinical stage from 01/12/2018: Stage IB (cT1a, cN0, cM0, G3, ER+, PR-, HER2-) - Signed  by Sindy Guadeloupe, MD on 01/12/2018    01/30/2018 - 04/26/2018 Chemotherapy   The patient had dexamethasone (DECADRON) 4 MG tablet, 8 mg, Oral, 2 times daily, 1 of 1 cycle, Start date: 01/12/2018, End date: 08/03/2018 palonosetron (ALOXI) injection 0.25 mg, 0.25 mg, Intravenous,  Once, 4 of 4 cycles Administration: 0.25 mg (01/30/2018), 0.25 mg (02/27/2018), 0.25 mg (03/28/2018), 0.25 mg (04/26/2018) pegfilgrastim (NEULASTA ONPRO KIT) injection 6 mg, 6 mg, Subcutaneous, Once, 4 of 4 cycles Administration: 6 mg (01/30/2018), 6 mg (02/27/2018), 6 mg (03/28/2018), 6 mg (04/26/2018) cyclophosphamide (CYTOXAN) 1,000 mg in sodium chloride 0.9 % 250 mL chemo infusion, 600 mg/m2 = 1,000 mg, Intravenous,  Once, 4 of 4 cycles Administration: 1,000 mg (01/30/2018), 1,000 mg (02/27/2018), 1,000 mg (03/28/2018), 1,000 mg (04/26/2018) DOCEtaxel (TAXOTERE) 130 mg in sodium chloride 0.9 % 250 mL chemo infusion, 75 mg/m2 = 130 mg, Intravenous,  Once, 4 of 4 cycles Dose modification: 60 mg/m2 (original dose 75 mg/m2, Cycle 2, Reason: Dose not tolerated) Administration: 130 mg (01/30/2018), 100 mg (02/27/2018), 100 mg (03/28/2018), 100 mg (04/26/2018)   for chemotherapy treatment.      The patient was originally diagnosed with right breast cancer in 1997, 10 mm grade 3, ER pr positive her 2 negative, node negative status post surgery and radiation treatment only. She never received chemotherapy or hormone therapy. For her vocal cord cancer, she had extensive surgery and radiation therapy in 1998. Due to past history of smoking, she was also noted to have lung nodules. She is undergoing active surveillance imaging study of the chest.   Patient noted to have adrenal mass on CT chest which prompted PET CT  in July 2018 which showed: IMPRESSION: 1. Right adrenal mass with peripheral hypermetabolism. Given interval development since 11/18/2015, suspicious for either isolated metastasis or a primary adrenal neoplasm. This  should be considered for biopsy. 2. No evidence of hypermetabolic primary malignancy or extraadrenal metastasis. Pulmonary nodules are not significantly hypermetabolic. 3. Coronary artery atherosclerosis. Aortic Atherosclerosis (ICD10-I70.0). 4. Hepatic steatosis. 5. Left sixth rib hypermetabolism is favored to be related to remote Trauma.   Patient underwent right adrenalectomy which showed: DIAGNOSIS:  A. ADRENAL GLAND, RIGHT; ADRENALECTOMY:  - HIGH GRADE CARCINOMA WITH EXTENSIVE NECROSIS.  - MARGINS OF THE SPECIMEN ARE NEGATIVE FOR CARCINOMA.   Comment:  Sections demonstrate abundant necrosis confined to the adrenal medullary  compartment. In one block of tissue a small fragment of viable malignant  neoplasm was identified. A panel of immunohistochemical stains was  performed with the following pattern of immunoreactivity:  Super pancytokeratin: Positive  GATA-3: Negative  TTF-1: Negative  P40: Negative  Napsin: Negative  PAX-8: Negative  Chromogranin: Negative     Repeat CT chest abdomen and pelvis in July 2019 showed stable left lower lobe pulmonary nodule.  No other evidence of metastatic disease.  New 4.5 mm nodule in the right breast.  This was followed by ultrasound mammogram and core biopsy of the breast lesion which was a grade 3 triple negative breast cancer.  Patient was seen by Dr. Bary Castilla and underwent lumpectomy on 12/08/2017.   Patient had a 5 mm weakly ER positive tumor.  Given her ongoing fatigue Oncotype testing was done to see if she could potentially avoid chemotherapy.  Oncotype shows a recurrence score of 53 with her risk of distant recurrence at 9 years at greater than 39% with hormone therapy alone.  Absolute benefit of chemotherapy was greater than 15% in her age group.  Patient completed 4 cycles of adjuvant TC chemotherapy in December 2019    Interval history-patient had COVID about 3 weeks ago and was hospitalized for close to 2 weeks.  She still reports  feeling considerably fatigued since that hospitalization.  Denies any breast concerns at this time.  She is also supposed to undergo endoscopy soon for dysphagia.  ECOG PS- 1 Pain scale- 4   Review of systems- Review of Systems  Constitutional:  Positive for malaise/fatigue. Negative for chills, fever and weight loss.  HENT:  Negative for congestion, ear discharge and nosebleeds.   Eyes:  Negative for blurred vision.  Respiratory:  Negative for cough, hemoptysis, sputum production, shortness of breath and wheezing.   Cardiovascular:  Negative for chest pain, palpitations, orthopnea and claudication.  Gastrointestinal:  Negative for abdominal pain, blood in stool, constipation, diarrhea, heartburn, melena, nausea and vomiting.  Genitourinary:  Negative for dysuria, flank pain, frequency, hematuria and urgency.  Musculoskeletal:  Negative for back pain, joint pain and myalgias.  Skin:  Negative for rash.  Neurological:  Negative for dizziness, tingling, focal weakness, seizures, weakness and headaches.  Endo/Heme/Allergies:  Does not bruise/bleed easily.  Psychiatric/Behavioral:  Negative for depression and suicidal ideas. The patient does not have insomnia.      Allergies  Allergen Reactions   Sulfa Antibiotics Other (See Comments)    Mouth sore and raw     Past Medical History:  Diagnosis Date   Anxiety    Arthritis    KNEE RIGHT   Breast cancer ([place]) 1997   right breast lumpectomy with radiation   Breast cancer ([place]) 11/15/2017   INVASIVE MAMMARY CARCINOMA/ triple negative   Cancer of vocal  cord ([place]) 11/10/2014   Cough 03/02/2017   INTERMTTENT DRY COUGH   Depression    GERD (gastroesophageal reflux disease)    History of hiatal hernia    Personal history of chemotherapy    Personal history of radiation therapy    Skin cancer    Throat cancer ([place]) 1998   radiation     Past Surgical History:  Procedure Laterality Date   BREAST BIOPSY Right 1997   positive    BREAST BIOPSY Right 11/15/2017   INVASIVE MAMMARY CARCINOMA triple negative   BREAST BIOPSY Right 07/20/2020   [place] bx, Q marker, path pending   BREAST LUMPECTOMY Right 1997   2019 also   BREAST LUMPECTOMY WITH SENTINEL LYMPH NODE BIOPSY Right 12/08/2017   Procedure: BREAST LUMPECTOMY WITH SENTINEL LYMPH NODE BX;  Surgeon: Robert Bellow, MD;  Location: [facility];  Service: General;  Laterality: Right;   BREAST REDUCTION WITH MASTOPEXY Left 06/10/2019   Procedure: Left breast mastopexy reduction for symmetry;  Surgeon: Wallace Going, DO;  Location: [place];  Service: Plastics;  Laterality: Left;  total 2.5 hours   COLONOSCOPY  2015   COLONOSCOPY WITH PROPOFOL N/A 10/29/2019   Procedure: COLONOSCOPY WITH PROPOFOL;  Surgeon: Lucilla Lame, MD;  Location: [facility] END[facility];  Service: Endoscopy;  Laterality: N/A;   ESOPHAGOGASTRODUODENOSCOPY (EGD) WITH PROPOFOL N/A 05/24/2019   Procedure: ESOPHAGOGASTRODUODENOSCOPY (EGD) WITH PROPOFOL;  Surgeon: Lucilla Lame, MD;  Location: [facility] ENDOSCOPY;  Service: Endoscopy;  Laterality: N/A;   ESOPHAGUS SURGERY     KNEE SURGERY Right    LIPOSUCTION WITH LIPOFILLING Right 06/10/2019   Procedure: right breast scar and capsule release with fat grafting;  Surgeon: Wallace Going, DO;  Location: [place];  Service: Plastics;  Laterality: Right;   PORT-A-CATH REMOVAL     PORTACATH PLACEMENT Right 01/17/2018   Procedure: INSERTION PORT-A-CATH- RIGHT;  Surgeon: Robert Bellow, MD;  Location: [facility];  Service: General;  Laterality: Right;   ROBOTIC ADRENALECTOMY Right 12/28/2016   Procedure: ROBOTIC ADRENALECTOMY;  Surgeon: Hollice Espy, MD;  Location: [facility];  Service: Urology;  Laterality: Right;   SKIN CANCER EXCISION     THROAT SURGERY  1998   throat cancer    TUBAL LIGATION     VENTRAL HERNIA REPAIR N/A 03/03/2017   Procedure: HERNIA REPAIR VENTRAL ADULT;  Surgeon: Clayburn Pert, MD;  Location: [facility];   Service: General;  Laterality: N/A;    Social History   Socioeconomic History   Marital status: Married    Spouse name: Not on file   Number of children: 4   Years of education: Not on file   Highest education level: 10th grade  Occupational History   Occupation: retired  Tobacco Use   Smoking status: Former    Packs/day: 1.50    Years: 30.00    Pack years: 45.00    Types: Cigarettes    Quit date: 05/03/1995    Years since quitting: 25.8   Smokeless tobacco: Never  Vaping Use   Vaping Use: Never used  Substance and Sexual Activity   Alcohol use: Yes    Alcohol/week: 4.0 standard drinks    Types: 4 Shots of liquor per week    Comment: 2 per night on weekends   Drug use: No   Sexual activity: Not Currently    Birth control/protection: Post-menopausal  Other Topics Concern   Not on file  Social History Narrative   Not on file   Social Determinants  of Health   Financial Resource Strain: Low Risk    Difficulty of Paying Living Expenses: Not hard at all  Food Insecurity: No Food Insecurity   Worried About [place] in the Last Year: Never true   [place] in the Last Year: Never true  Transportation Needs: No Transportation Needs   Lack of Transportation (Medical): No   Lack of Transportation (Non-Medical): No  Physical Activity: Insufficiently Active   Days of Exercise per Week: 3 days   Minutes of Exercise per Session: 30 min  Stress: Stress Concern Present   Feeling of Stress : Rather much  Social Connections: Moderately Isolated   Frequency of Communication with Friends and Family: More than three times a week   Frequency of Social Gatherings with Friends and Family: More than three times a week   Attends Religious Services: Never   Marine scientist or Organizations: No   Attends Music therapist: Never   Marital Status: Married  Human resources officer Violence: Not At Risk   Fear of Current or Ex-Partner: No   Emotionally Abused:  No   Physically Abused: No   Sexually Abused: No    Family History  Problem Relation Age of Onset   Diabetes Mother    Heart attack Mother    Cancer Sister        cancer of eyes, spread to lung, other places. died at 30   Cancer Sister 53       unknown primary, spread to bone, brain died in 49's   Cancer Paternal Aunt        unk type   Cancer Paternal Grandmother        type unk   Prostate cancer Neg Hx    Kidney cancer Neg Hx    Bladder Cancer Neg Hx      Current Outpatient Medications:    albuterol (VENTOLIN HFA) 108 (90 Base) MCG/ACT inhaler, Inhale 2 puffs into the lungs every 6 (six) hours as needed for shortness of breath., Disp: 6.7 g, Rfl: 0   aspirin EC 81 MG tablet, Take 1 tablet (81 mg total) by mouth daily. Swallow whole., Disp: 90 tablet, Rfl: 3   atorvastatin (LIPITOR) 40 MG tablet, Take 1 tablet (40 mg total) by mouth daily., Disp: 30 tablet, Rfl: 6   benzonatate (TESSALON PERLES) 100 MG capsule, Take 1-2 tabs TID prn cough, Disp: 30 capsule, Rfl: 0   brompheniramine-pseudoephedrine-DM 30-2-10 MG/5ML syrup, Take 5 mLs by mouth 4 (four) times daily as needed., Disp: 120 mL, Rfl: 0   Budeson-Glycopyrrol-Formoterol (BREZTRI AEROSPHERE) 160-9-4.8 MCG/ACT AERO, Inhale 2 puffs into the lungs in the morning and at bedtime., Disp: 5.9 g, Rfl: 5   busPIRone (BUSPAR) 5 MG tablet, Take 1 tablet (5 mg total) by mouth 2 (two) times daily., Disp: 60 tablet, Rfl: 0   cyclobenzaprine (FLEXERIL) 5 MG tablet, TAKE 1 TABLET BY MOUTH 3 TIMES DAILY AS NEEDED FOR MUSCLE SPASMS, Disp: 30 tablet, Rfl: 3   DULoxetine (CYMBALTA) 60 MG capsule, Take by mouth., Disp: , Rfl:    fluconazole (DIFLUCAN) 100 MG tablet, Take 1 tablet (100 mg total) by mouth daily., Disp: 7 tablet, Rfl: 0   fluticasone (FLONASE) 50 MCG/ACT nasal spray, Place 1 spray into both nostrils at bedtime., Disp: 16 g, Rfl: 6   gabapentin (NEURONTIN) 300 MG capsule, TAKE 1 CAPSULE BY MOUTH 3 TIMES A DAY, Disp: 60 capsule, Rfl:  1   HYDROcodone-acetaminophen (NORCO/VICODIN) 5-325 MG tablet,  Take 1 tablet by mouth every 4 (four) hours as needed for moderate pain., Disp: 35 tablet, Rfl: 0   omeprazole (PRILOSEC) 40 MG capsule, Take 40 mg by mouth in the morning and at bedtime., Disp: , Rfl:    ondansetron (ZOFRAN ODT) 4 MG disintegrating tablet, Take 1 tablet (4 mg total) by mouth every 8 (eight) hours as needed., Disp: 15 tablet, Rfl: 0   ondansetron (ZOFRAN) 4 MG tablet, Take 1 tablet (4 mg total) by mouth every 8 (eight) hours as needed for nausea or vomiting., Disp: 20 tablet, Rfl: 0   oxybutynin (DITROPAN XL) 15 MG 24 hr tablet, Take 1 tablet (15 mg total) by mouth daily., Disp: 30 tablet, Rfl: 11   rOPINIRole (REQUIP) 0.25 MG tablet, TAKE 1 TO 2 TABLETS BY MOUTH AT BEDTIME FOR RESTLESS LEGS, Disp: 60 tablet, Rfl: 1 No current facility-administered medications for this visit.  Facility-Administered Medications Ordered in Other Visits:    cyanocobalamin ((VITAMIN B-12)) injection 1,000 mcg, 1,000 mcg, Intramuscular, Q30 days, Sindy Guadeloupe, MD, 1,000 mcg at 09/28/18 1013   cyanocobalamin ((VITAMIN B-12)) injection 1,000 mcg, 1,000 mcg, Intramuscular, Q30 days, Sindy Guadeloupe, MD, 1,000 mcg at 11/30/18 1355   cyanocobalamin ((VITAMIN B-12)) injection 1,000 mcg, 1,000 mcg, Intramuscular, Q30 days, Sindy Guadeloupe, MD, 1,000 mcg at 08/16/19 1144  Physical exam:  Vitals:   02/26/21 1315 02/26/21 1317  BP:  130/83  Pulse:  85  Resp: 18 20  Temp:  99.1 F (37.3 C)  TempSrc: Tympanic Tympanic  SpO2: 97%   Weight: 145 lb (65.8 kg)    Physical Exam Constitutional:      General: She is not in acute distress. Cardiovascular:     Rate and Rhythm: Normal rate and regular rhythm.     Heart sounds: Normal heart sounds.  Pulmonary:     Effort: Pulmonary effort is normal.     Breath sounds: Normal breath sounds.  Abdominal:     General: Bowel sounds are normal.     Palpations: Abdomen is soft.  Skin:    General: Skin  is warm and dry.  Neurological:     Mental Status: She is alert and oriented to person, place, and time.   Breast exam was performed in seated and lying down position. Patient is status post right lumpectomy with a well-healed surgical scar. No evidence of any palpable masses. No evidence of axillary adenopathy. No evidence of any palpable masses or lumps in the left breast. No evidence of leftt axillary adenopathy     CMP Latest Ref Rng & Units 02/11/2021  Glucose 70 - 99 mg/dL 101(H)  BUN 8 - 23 mg/dL 30(H)  Creatinine 0.44 - 1.00 mg/dL 0.85  Sodium 135 - 145 mmol/L 136  Potassium 3.5 - 5.1 mmol/L 4.9  Chloride 98 - 111 mmol/L 98  CO2 22 - 32 mmol/L 34(H)  Calcium 8.9 - 10.3 mg/dL 8.8(L)  Total Protein 6.5 - 8.1 g/dL -  Total Bilirubin 0.3 - 1.2 mg/dL -  Alkaline Phos 38 - 126 U/L -  AST 15 - 41 U/L -  ALT 0 - 44 U/L -   CBC Latest Ref Rng & Units 02/26/2021  WBC 4.0 - 10.5 K/uL 6.8  Hemoglobin 12.0 - 15.0 g/dL 12.6  Hematocrit 36.0 - 46.0 % 39.4  Platelets 150 - 400 K/uL 155    No images are attached to the encounter.  DG Chest 2 View  Result Date: 01/29/2021 CLINICAL DATA:  Cough, body aches,  COVID-19 positive EXAM: CHEST - 2 VIEW COMPARISON:  06/08/2020 CT 10/31/2018 FINDINGS: Frontal and lateral views of the chest demonstrate an unremarkable cardiac silhouette. Stable 1.5 cm left lower lobe pulmonary nodule. The remaining small nodule seen on recent chest CT are not readily apparent by x-ray. No acute airspace disease, effusion, or pneumothorax. Minimal scarring at the left lung base. No acute bony abnormality. IMPRESSION: 1. No acute intrathoracic process. 2. Stable 1.5 cm left lower lobe pulmonary nodule, unchanged since 2020. The remaining pulmonary nodule seen on the recent chest CT 08/21/2020 are not readily apparent by x-ray. Electronically Signed   By: Randa Ngo M.D.   On: 01/29/2021 16:47   CT Angio Chest PE W and/or Wo Contrast  Result Date:  01/31/2021 CLINICAL DATA:  Ongoing symptoms of COVID. EXAM: CT ANGIOGRAPHY CHEST WITH CONTRAST TECHNIQUE: Multidetector CT imaging of the chest was performed using the standard protocol during bolus administration of intravenous contrast. Multiplanar CT image reconstructions and MIPs were obtained to evaluate the vascular anatomy. CONTRAST:  57m OMNIPAQUE IOHEXOL 350 MG/ML SOLN COMPARISON:  August 21, 2020 FINDINGS: Cardiovascular: There is moderate severity calcification of the aortic arch, without evidence of aortic aneurysm or dissection. Satisfactory opacification of the pulmonary arteries to the segmental level. No evidence of pulmonary embolism. Normal heart size. No pericardial effusion. Mediastinum/Nodes: No enlarged mediastinal, hilar, or axillary lymph nodes. Thyroid gland, trachea, and esophagus demonstrate no significant findings. Lungs/Pleura: Mild atelectasis is seen within the posterior aspects of the bilateral lung left greater than right. There is no evidence of acute infiltrate, pleural effusion or pneumothorax. Upper Abdomen: There is a small hiatal hernia. A 1.3 cm diameter simple cyst is seen along the anterior aspect of the upper pole of the left kidney. Musculoskeletal: A 4.9 cm x 2.3 cm lipoma is seen along the anterolateral aspect of the lower right chest wall. No acute osseous abnormalities are identified. Review of the MIP images confirms the above findings. IMPRESSION: 1. No CT evidence of pulmonary embolism or acute cardiopulmonary disease. 2. Small hiatal hernia. 3. 4.9 cm x 2.3 cm lower right chest wall lipoma. 4. Aortic atherosclerosis. Aortic Atherosclerosis (ICD10-I70.0). Electronically Signed   By: TVirgina NorfolkM.D.   On: 01/31/2021 20:23     Assessment and plan- Patient is a 65y.o. female with history of right breast ER positive stage I breast cancer in 1997, stage I squamous cell carcinoma of the vocal cord in 2015 and a high-grade carcinoma in the adrenal gland of  unknown primary now presenting with fourth malignancy in her right breast biopsy shows triple negative breast cancer. Final path showed 5 mm grade 3 Er weakly positive 1-10%, pr negative and her 2 negative tumor.  Oncotype came back as high risk.  She completed 4 cycles of adjuvant TC chemotherapy in December 2019 .  She is here for follow-up of following issues:  Right breast cancer: S/p 4 cycles of adjuvant chemotherapy in 2019.  Clinically doing well with no concerning signs and symptoms of recurrence based on today's exam. She is already scheduled for a mammogram on 03/04/2021.  I will see her back in 6 months. History of B12 deficiency: She has not recently been on any B12 injections and recent labsHave not shown any evidence of B12 deficiency.  Continue to monitor  Lung nodules: She had her CT on 02/18/2021 when she was admitted for COVID and there was no evidence of lung nodules or abnormal adenopathy noted on CT.  She does not  require any further surveillance for this  I will see her back in 6 months with labs Visit Diagnosis 1. Encounter for follow-up surveillance of breast cancer   2. B12 deficiency      Dr. Randa Evens, MD, MPH [facility] [phone] 02/26/2021 3:59 PM

## 2021-02-26 NOTE — Progress Notes (Signed)
Patient here for follow up today with labs. She states that she was recently hospitalized for covid-19, and she still hasn't fully recovered. She complains of continued weakness, shortness of breath, wheezing and trouble swallowing. She states that she slipped and fell yesterday in her yard on some wet grass, and she is still very sore, where she fell on her right side. She also states that she fell a couple of months ago at home, when she got her foot stuck at the bottom of the stairs. She denies having any injuries from that fall. Medication list was printed and verified with patient today.

## 2021-03-02 ENCOUNTER — Other Ambulatory Visit: Payer: Self-pay

## 2021-03-02 ENCOUNTER — Ambulatory Visit (INDEPENDENT_AMBULATORY_CARE_PROVIDER_SITE_OTHER): Payer: Medicare HMO | Admitting: Family Medicine

## 2021-03-02 ENCOUNTER — Encounter: Payer: Self-pay | Admitting: Family Medicine

## 2021-03-02 VITALS — BP 112/72 | HR 95 | Temp 98.3°F | Resp 18 | Ht 61.0 in | Wt 143.0 lb

## 2021-03-02 DIAGNOSIS — C50511 Malignant neoplasm of lower-outer quadrant of right female breast: Secondary | ICD-10-CM

## 2021-03-02 DIAGNOSIS — F32A Depression, unspecified: Secondary | ICD-10-CM | POA: Diagnosis not present

## 2021-03-02 DIAGNOSIS — G609 Hereditary and idiopathic neuropathy, unspecified: Secondary | ICD-10-CM | POA: Diagnosis not present

## 2021-03-02 DIAGNOSIS — Z171 Estrogen receptor negative status [ER-]: Secondary | ICD-10-CM | POA: Diagnosis not present

## 2021-03-02 DIAGNOSIS — G894 Chronic pain syndrome: Secondary | ICD-10-CM | POA: Diagnosis not present

## 2021-03-02 DIAGNOSIS — F419 Anxiety disorder, unspecified: Secondary | ICD-10-CM | POA: Diagnosis not present

## 2021-03-02 DIAGNOSIS — J9601 Acute respiratory failure with hypoxia: Secondary | ICD-10-CM | POA: Diagnosis not present

## 2021-03-02 DIAGNOSIS — J432 Centrilobular emphysema: Secondary | ICD-10-CM

## 2021-03-02 DIAGNOSIS — U071 COVID-19: Secondary | ICD-10-CM | POA: Diagnosis not present

## 2021-03-02 NOTE — Progress Notes (Signed)
I,April Miller,acting as a scribe for Wilhemena Durie, MD.,have documented all relevant documentation on the behalf of Wilhemena Durie, MD,as directed by  Wilhemena Durie, MD while in the presence of Wilhemena Durie, MD.   Established patient visit   Patient: Denise Watts   DOB: 29-Sep-1955   65 y.o. Female  MRN: [id number] Visit Date: 03/02/2021  Today's healthcare provider: Wilhemena Durie, MD   Chief Complaint  Patient presents with   Hospitalization Follow-up   Subjective    HPI  Patient comes in today for follow-up of hospital admission.  COVID COVID-pneumonia hypoxia and UTI.  She does have O2 available at home now.  She quit smoking in 1997.  He has some shortness of breath with this is improving.  Mild cough which is also improving.  No fever.  Ongoing fatigue Follow up Hospitalization  Patient was admitted to [facility] on 01/31/2021 and discharged on 02/11/2021. She was treated for positive covid-19, Acute hypoxemic respiratory failure due to COVID-19, Centrilobular emphysema, SOB (shortness of breath). Treatment for this included see notes in chart.  Telephone follow up was done on none. She reports good compliance with treatment. She reports this condition is improved.  ----------------------------------------------------------------------------------------- -  Patient states she really doesn't feel much better since hospital discharge. She is still having shortness of breath, fatigue, and she is balance.     Medications: Outpatient Medications Prior to Visit  Medication Sig   albuterol (VENTOLIN HFA) 108 (90 Base) MCG/ACT inhaler Inhale 2 puffs into the lungs every 6 (six) hours as needed for shortness of breath.   aspirin EC 81 MG tablet Take 1 tablet (81 mg total) by mouth daily. Swallow whole.   atorvastatin (LIPITOR) 40 MG tablet Take 1 tablet (40 mg total) by mouth daily.   benzonatate (TESSALON PERLES) 100 MG capsule Take 1-2 tabs TID prn cough    brompheniramine-pseudoephedrine-DM 30-2-10 MG/5ML syrup Take 5 mLs by mouth 4 (four) times daily as needed.   Budeson-Glycopyrrol-Formoterol (BREZTRI AEROSPHERE) 160-9-4.8 MCG/ACT AERO Inhale 2 puffs into the lungs in the morning and at bedtime.   busPIRone (BUSPAR) 5 MG tablet Take 1 tablet (5 mg total) by mouth 2 (two) times daily.   cyclobenzaprine (FLEXERIL) 5 MG tablet TAKE 1 TABLET BY MOUTH 3 TIMES DAILY AS NEEDED FOR MUSCLE SPASMS   DULoxetine (CYMBALTA) 60 MG capsule Take by mouth.   fluconazole (DIFLUCAN) 100 MG tablet Take 1 tablet (100 mg total) by mouth daily.   fluticasone (FLONASE) 50 MCG/ACT nasal spray Place 1 spray into both nostrils at bedtime.   gabapentin (NEURONTIN) 300 MG capsule TAKE 1 CAPSULE BY MOUTH 3 TIMES A DAY   HYDROcodone-acetaminophen (NORCO/VICODIN) 5-325 MG tablet Take 1 tablet by mouth every 4 (four) hours as needed for moderate pain.   omeprazole (PRILOSEC) 40 MG capsule Take 40 mg by mouth in the morning and at bedtime.   ondansetron (ZOFRAN ODT) 4 MG disintegrating tablet Take 1 tablet (4 mg total) by mouth every 8 (eight) hours as needed.   ondansetron (ZOFRAN) 4 MG tablet Take 1 tablet (4 mg total) by mouth every 8 (eight) hours as needed for nausea or vomiting.   oxybutynin (DITROPAN XL) 15 MG 24 hr tablet Take 1 tablet (15 mg total) by mouth daily.   rOPINIRole (REQUIP) 0.25 MG tablet TAKE 1 TO 2 TABLETS BY MOUTH AT BEDTIME FOR RESTLESS LEGS   Facility-Administered Medications Prior to Visit  Medication Dose Route Frequency Provider  cyanocobalamin ((VITAMIN B-12)) injection 1,000 mcg  1,000 mcg Intramuscular Q30 days Sindy Guadeloupe, MD   cyanocobalamin ((VITAMIN B-12)) injection 1,000 mcg  1,000 mcg Intramuscular Q30 days Sindy Guadeloupe, MD   cyanocobalamin ((VITAMIN B-12)) injection 1,000 mcg  1,000 mcg Intramuscular Q30 days Sindy Guadeloupe, MD    Review of Systems  Constitutional:  Negative for appetite change, chills, fatigue and fever.   Respiratory:  Negative for chest tightness and shortness of breath.   Cardiovascular:  Negative for chest pain and palpitations.  Gastrointestinal:  Negative for abdominal pain, nausea and vomiting.  Neurological:  Negative for dizziness and weakness.   Last hemoglobin A1c Lab Results  Component Value Date   HGBA1C 6.4 (H) 02/07/2021       Objective    BP 112/72 (BP Location: Left Arm, Patient Position: Sitting, Cuff Size: Normal)   Pulse 95   Temp 98.3 F (36.8 C) (Oral)   Resp 18   Ht 5\' 1"  (1.549 m)   Wt 143 lb (64.9 kg)   SpO2 97%   BMI 27.02 kg/m  BP Readings from Last 3 Encounters:  03/02/21 112/72  02/26/21 130/83  02/16/21 (!) 141/81   Wt Readings from Last 3 Encounters:  03/02/21 143 lb (64.9 kg)  02/26/21 145 lb (65.8 kg)  02/16/21 138 lb 12.8 oz (63 kg)      Physical Exam Vitals reviewed.  Constitutional:      General: She is not in acute distress.    Appearance: She is well-developed.  HENT:     Head: Normocephalic and atraumatic.     Right Ear: Hearing normal.     Left Ear: Hearing normal.     Nose: Nose normal.  Eyes:     General: Lids are normal. No scleral icterus.       Right eye: No discharge.        Left eye: No discharge.     Conjunctiva/sclera: Conjunctivae normal.  Cardiovascular:     Rate and Rhythm: Normal rate and regular rhythm.     Heart sounds: Normal heart sounds.  Pulmonary:     Effort: Pulmonary effort is normal. No respiratory distress.     Breath sounds: Wheezing and rhonchi present.     Comments: Mild diffuse rhonchi and expiratory wheezing Skin:    Findings: No lesion or rash.  Neurological:     General: No focal deficit present.     Mental Status: She is alert and oriented to person, place, and time.  Psychiatric:        Mood and Affect: Mood normal.        Speech: Speech normal.        Behavior: Behavior normal.        Thought Content: Thought content normal.        Judgment: Judgment normal.      No results  found for any visits on 03/02/21.  Assessment & Plan     1. Acute hypoxemic respiratory failure due to COVID-19 [facility])  Patient has home O2 for use as she needs it.  O2 sat at rest on the office today is 97%. On her next office visit we will try to check her rest O2 sat and her walking O2 sat.  2. Centrilobular emphysema ([place]) Followed by pulmonary.  She quit smoking 25 years ago.  3. Idiopathic peripheral neuropathy   4. Malignant neoplasm of lower-outer quadrant of right breast of female, estrogen receptor negative ([place]) Followed by oncology  5.  Chronic pain syndrome On duloxetine and Gabapentin and she has takensome Norco in the past.  6. Anxiety and depression Duloxetine   No follow-ups on file.      I, Wilhemena Durie, MD, have reviewed all documentation for this visit. The documentation on 03/07/21 for the exam, diagnosis, procedures, and orders are all accurate and complete.    Zuri Bradway Cranford Mon, MD  [facility] [phone] (phone) [phone] (fax)  [place]

## 2021-03-04 ENCOUNTER — Ambulatory Visit
Admission: RE | Admit: 2021-03-04 | Discharge: 2021-03-04 | Disposition: A | Discharge status: Home / Self Care | Payer: Medicare HMO | Source: Ambulatory Visit | Attending: Family Medicine | Admitting: Family Medicine

## 2021-03-04 ENCOUNTER — Other Ambulatory Visit: Payer: Self-pay

## 2021-03-04 DIAGNOSIS — Z1231 Encounter for screening mammogram for malignant neoplasm of breast: Secondary | ICD-10-CM | POA: Diagnosis not present

## 2021-03-05 ENCOUNTER — Other Ambulatory Visit: Payer: Self-pay

## 2021-03-05 ENCOUNTER — Inpatient Hospital Stay: Payer: Medicare HMO | Attending: Oncology

## 2021-03-05 DIAGNOSIS — E538 Deficiency of other specified B group vitamins: Secondary | ICD-10-CM | POA: Insufficient documentation

## 2021-03-05 DIAGNOSIS — Z23 Encounter for immunization: Secondary | ICD-10-CM

## 2021-03-05 MED ORDER — CYANOCOBALAMIN 1000 MCG/ML IJ SOLN
1000.0000 ug | INTRAMUSCULAR | Status: DC
  Administered 2021-03-05: 1000 ug via INTRAMUSCULAR
  Filled 2021-03-05: qty 1

## 2021-03-08 ENCOUNTER — Other Ambulatory Visit: Payer: Self-pay | Admitting: Family Medicine

## 2021-03-08 NOTE — Addendum Note (Signed)
Addended by: Julieta Bellini on: 03/08/2021 04:06 PM   Modules accepted: Orders

## 2021-03-08 NOTE — Telephone Encounter (Signed)
[facility] faxed refill request for the following medications:  gabapentin (NEURONTIN) 300 MG capsule   Please advise.

## 2021-03-09 MED ORDER — GABAPENTIN 300 MG PO CAPS: 300.0000 mg | ORAL_CAPSULE | Freq: Three times a day (TID) | ORAL | 5 refills | Status: DC

## 2021-03-10 ENCOUNTER — Other Ambulatory Visit: Payer: Self-pay

## 2021-03-10 ENCOUNTER — Ambulatory Visit
Admission: RE | Admit: 2021-03-10 | Discharge: 2021-03-10 | Disposition: A | Discharge status: Home / Self Care | Payer: Medicare HMO | Source: Ambulatory Visit | Attending: Radiation Oncology | Admitting: Radiation Oncology

## 2021-03-10 ENCOUNTER — Encounter: Payer: Self-pay | Admitting: Radiation Oncology

## 2021-03-10 VITALS — BP 116/68 | HR 109 | Temp 98.1°F | Resp 22 | Wt 143.3 lb

## 2021-03-10 DIAGNOSIS — Z171 Estrogen receptor negative status [ER-]: Secondary | ICD-10-CM

## 2021-03-10 DIAGNOSIS — Z08 Encounter for follow-up examination after completed treatment for malignant neoplasm: Secondary | ICD-10-CM | POA: Diagnosis not present

## 2021-03-10 DIAGNOSIS — Z923 Personal history of irradiation: Secondary | ICD-10-CM | POA: Diagnosis not present

## 2021-03-10 DIAGNOSIS — N6001 Solitary cyst of right breast: Secondary | ICD-10-CM | POA: Diagnosis not present

## 2021-03-10 DIAGNOSIS — Z853 Personal history of malignant neoplasm of breast: Secondary | ICD-10-CM | POA: Insufficient documentation

## 2021-03-10 DIAGNOSIS — Z8521 Personal history of malignant neoplasm of larynx: Secondary | ICD-10-CM | POA: Diagnosis not present

## 2021-03-10 DIAGNOSIS — Z8616 Personal history of COVID-19: Secondary | ICD-10-CM | POA: Diagnosis not present

## 2021-03-10 NOTE — Progress Notes (Signed)
Radiation Oncology Follow up Note  Name: Denise Watts   Date:   03/10/2021 MRN:  [id number] DOB: 09/09/1955    This 65 y.o. female presents to the clinic today for 3-year follow-up status post whole breast radiation to her right breast for incur invasive mammary carcinoma previously treated back in 1987.  REFERRING PROVIDER: Chrismon, Vickki Muff, PA-C  HPI: Patient is a 65 year old female now out 3 years having completed salvage radiation therapy to her right breast status post recurrent invasive mammary carcinoma..  Patient is also received radiation therapy for laryngeal cancer remotely.  From a breast standpoint she is doing well specifically denies breast tenderness bone pain.  She is still recovering now at 1 month from [place].  She seems to be having some persistent pulmonary symptoms from her COVID.  She had a mammogram back in March showing a complex cyst at the area of palpable concern in her right axilla which was biopsied and shown to be fat necrosis and fibrosis negative for atypia malignancy.  Patient is also had several CT scans associated with her COVID showing no evidence of acute cardiopulmonary disease.  COMPLICATIONS OF TREATMENT: none  FOLLOW UP COMPLIANCE: keeps appointments   PHYSICAL EXAM:  BP 116/68 (BP Location: Left Arm, Patient Position: Sitting)   Pulse (!) 109   Temp 98.1 F (36.7 C) (Tympanic)   Resp (!) 22   Wt 143 lb 4.8 oz (65 kg)   SpO2 98%   BMI 27.08 kg/m  Lungs are clear to A&P cardiac examination essentially unremarkable with regular rate and rhythm. No dominant mass or nodularity is noted in either breast in 2 positions examined. Incision is well-healed. No axillary or supraclavicular adenopathy is appreciated. Cosmetic result is excellent.  Well-developed well-nourished patient in NAD. HEENT reveals PERLA, EOMI, discs not visualized.  Oral cavity is clear. No oral mucosal lesions are identified. Neck is clear without evidence of cervical or  supraclavicular adenopathy. Lungs are clear to A&P. Cardiac examination is essentially unremarkable with regular rate and rhythm without murmur rub or thrill. Abdomen is benign with no organomegaly or masses noted. Motor sensory and DTR levels are equal and symmetric in the upper and lower extremities. Cranial nerves II through XII are grossly intact. Proprioception is intact. No peripheral adenopathy or edema is identified. No motor or sensory levels are noted. Crude visual fields are within normal range.  RADIOLOGY RESULTS: CT scans and mammograms reviewed compatible with above-stated findings  PLAN: Present time patient continues to do well from a breast standpoint with no evidence of disease.  She continues follow-up care with medical oncology.  I am going to discontinue follow-up care now out over 3 years.  Patient knows to call with any concerns at any time.  I would like to take this opportunity to thank you for allowing me to participate in the care of your patient.Noreene Filbert, MD

## 2021-03-22 ENCOUNTER — Ambulatory Visit (INDEPENDENT_AMBULATORY_CARE_PROVIDER_SITE_OTHER): Payer: Medicare HMO | Admitting: Gastroenterology

## 2021-03-22 ENCOUNTER — Encounter: Payer: Self-pay | Admitting: Gastroenterology

## 2021-03-22 VITALS — BP 106/65 | HR 109 | Temp 97.0°F | Ht 61.0 in | Wt 139.6 lb

## 2021-03-22 DIAGNOSIS — R1310 Dysphagia, unspecified: Secondary | ICD-10-CM

## 2021-03-22 NOTE — Progress Notes (Signed)
Primary Care Physician: Jerrol Banana., MD  Primary Gastroenterologist:  Dr. Lucilla Lame  Chief Complaint  Patient presents with   Hospitalization Follow-up    HPI: Denise Watts is a 65 y.o. female here with a history of a colonoscopy by me in 2021 with 3 polyps found at that time which were adenomatous.  The patient was recommended to have a repeat colonoscopy in 3 years.  The patient was recently in the hospital with COVID and hypoxia with what was reported to be respiratory failure due to COVID.  The patient was having dysphagia and was seen by my partner and by ENT who recommended an upper endoscopy as an outpatient due to the dysphagia.  Since the patient was in respiratory distress it was thought better to do the patient when she had recovered more fully and therefore she is now here to see me today.  The patient continues to be short of breath after her discharge from the hospital for Covid.  She reports that she feels like food is getting stuck in her throat.  There is no report of black stools or bloody stools.  She also denies any abdominal pain.  She states that she feels choked at night and continues to have a smothering feeling at times when she can't breathe.  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 6.7 g 0   aspirin EC 81 MG tablet Take 1 tablet (81 mg total) by mouth daily. Swallow whole. 90 tablet 3   benzonatate (TESSALON PERLES) 100 MG capsule Take 1-2 tabs TID prn cough 30 capsule 0   brompheniramine-pseudoephedrine-DM 30-2-10 MG/5ML syrup Take 5 mLs by mouth 4 (four) times daily as needed. 120 mL 0   Budeson-Glycopyrrol-Formoterol (BREZTRI AEROSPHERE) 160-9-4.8 MCG/ACT AERO Inhale 2 puffs into the lungs in the morning and at bedtime. 5.9 g 5   busPIRone (BUSPAR) 5 MG tablet Take 1 tablet (5 mg total) by mouth 2 (two) times daily. 60 tablet 0   cyclobenzaprine (FLEXERIL) 5 MG tablet TAKE 1 TABLET BY MOUTH 3 TIMES DAILY AS NEEDED FOR MUSCLE SPASMS 30 tablet 3   DULoxetine (CYMBALTA) 60 MG capsule Take by mouth.     fluconazole (DIFLUCAN) 100 MG tablet Take 1 tablet (100 mg total) by mouth daily. 7 tablet 0   fluticasone (FLONASE) 50 MCG/ACT nasal spray Place 1 spray into both nostrils at bedtime. 16 g 6   gabapentin (NEURONTIN) 300 MG capsule Take 1 capsule (300 mg total) by mouth 3 (three) times daily. 60 capsule 5   omeprazole (PRILOSEC) 40 MG capsule Take 40 mg by mouth in the morning and at bedtime.     ondansetron (ZOFRAN ODT)  4 MG disintegrating tablet Take 1 tablet (4 mg total) by mouth every 8 (eight) hours as needed. 15 tablet 0   ondansetron (ZOFRAN) 4 MG tablet Take 1 tablet (4 mg total) by mouth every 8 (eight) hours as needed for nausea or vomiting. 20 tablet 0   oxybutynin (DITROPAN XL) 15 MG 24 hr tablet Take 1 tablet (15 mg total) by mouth daily. 30 tablet 11   rOPINIRole (REQUIP) 0.25 MG tablet TAKE 1 TO 2 TABLETS BY MOUTH AT BEDTIME FOR RESTLESS LEGS 60 tablet 1   atorvastatin (LIPITOR) 40 MG tablet Take 1 tablet (40 mg total) by mouth daily. 30 tablet 6   HYDROcodone-acetaminophen (NORCO/VICODIN) 5-325 MG tablet Take 1 tablet by mouth every 4 (four) hours as needed for moderate pain. (Patient not taking: Reported on 03/22/2021) 35 tablet 0   No  current facility-administered medications for this visit.   Facility-Administered Medications Ordered in Other Visits  Medication Dose Route Frequency Provider Last Rate Last Admin   cyanocobalamin ((VITAMIN B-12)) injection 1,000 mcg  1,000 mcg Intramuscular Q30 days Sindy Guadeloupe, MD   1,000 mcg at 11/30/18 1355   cyanocobalamin ((VITAMIN B-12)) injection 1,000 mcg  1,000 mcg Intramuscular Q30 days Sindy Guadeloupe, MD   1,000 mcg at 08/16/19 1144    Allergies as of 03/22/2021 - Review Complete 03/22/2021  Allergen Reaction Noted   Sulfa antibiotics Other (See Comments) 10/28/2020    ROS:  General: Negative for anorexia, weight loss, fever, chills, fatigue, weakness. ENT: Negative for hoarseness, difficulty swallowing , nasal congestion. CV: Negative for chest pain, angina, palpitations, dyspnea on exertion, peripheral edema.  Respiratory: Negative for dyspnea at rest, dyspnea on exertion, cough, sputum, wheezing.  GI: See history of present illness. GU:  Negative for dysuria, hematuria, urinary incontinence, urinary frequency, nocturnal urination.  Endo: Negative for unusual weight change.    Physical Examination:   BP 106/65 (BP Location: Left Arm, Patient Position: Sitting, Cuff Size: Normal)   Pulse (!) 109   Temp (!) 97 F (36.1 C) (Temporal)   Ht 5\' 1"  (1.549 m)   Wt 139 lb 9.6 oz (63.3 kg)   BMI 26.38 kg/m   General: Well-nourished, well-developed in no acute distress.  Eyes: No icterus. Conjunctivae pink. Lungs: Clear to auscultation bilaterally. Non-labored. Heart: Regular rate and rhythm, no murmurs rubs or gallops.  Abdomen: Bowel sounds are normal, nontender, nondistended, no hepatosplenomegaly or masses, no abdominal bruits or hernia , no rebound or guarding.   Extremities: No lower extremity edema. No clubbing or deformities. Neuro: Alert and oriented x 3.  Grossly intact. Skin: Warm and dry, no jaundice.   Psych: Alert and cooperative, normal mood and  affect.  Labs:    Imaging Studies: MM 3D SCREEN BREAST BILATERAL  Result Date: 03/05/2021 CLINICAL DATA:  Screening. EXAM: DIGITAL SCREENING BILATERAL MAMMOGRAM WITH TOMOSYNTHESIS AND CAD TECHNIQUE: Bilateral screening digital craniocaudal and mediolateral oblique mammograms were obtained. Bilateral screening digital breast tomosynthesis was performed. The images were evaluated with computer-aided detection. COMPARISON:  Previous exam(s). ACR Breast Density Category b: There are scattered areas of fibroglandular density. FINDINGS: There are no findings suspicious for malignancy. IMPRESSION: No mammographic evidence of malignancy. A result letter of this screening mammogram will be mailed directly to the patient. RECOMMENDATION: Screening mammogram in one year. (Code:SM-B-01Y) BI-RADS CATEGORY  1: Negative. Electronically Signed   By: Marin Olp M.D.   On: 03/05/2021 08:40   Assessment and Plan:   Denise Watts is a 65 y.o.  y/o female who comes in today with a history of Covid with dysphagia and a prolonged hospital stay due to respiratory failure from Covid 37. The patient remained short of breath and was evaluated by ENT for her dysphagia.  An upper endoscopy was recommended.  Due to her continued shortness of breath I have elected to set the patient up for a barium swallow to see if there is a stricture or this is a motility disorder.  If there is a stricture seen on the imaging then she will be set up for an upper endoscopy with possible dilation.  The patient has been explained the plan and agrees with it.     Lucilla Lame, MD. [facility]    Note: This dictation was prepared with Dragon dictation along with smaller phrase technology. Any transcriptional errors that result from this process are unintentional.

## 2021-03-22 NOTE — Telephone Encounter (Signed)
Deanna resent fax.

## 2021-03-22 NOTE — Telephone Encounter (Signed)
Left voice message for Denise Watts giving okay for verbal orders. Asked to call back to let us know that she receives this messages and if she has any other questions.

## 2021-03-22 NOTE — Telephone Encounter (Signed)
Deanna,from [facility], calling to receive an update. She states that she has not received a response in regards to these orders and is needing these in order to continue seeing the pt.  Order [id number] Please advise.     Callback # [phone]  Fax# [phone]

## 2021-03-23 NOTE — Telephone Encounter (Signed)
Fax received

## 2021-03-25 DIAGNOSIS — U071 COVID-19: Secondary | ICD-10-CM | POA: Diagnosis not present

## 2021-03-30 ENCOUNTER — Other Ambulatory Visit: Payer: Self-pay | Admitting: Gastroenterology

## 2021-03-30 ENCOUNTER — Ambulatory Visit
Admission: RE | Admit: 2021-03-30 | Discharge: 2021-03-30 | Disposition: A | Discharge status: Home / Self Care | Payer: Medicare HMO | Source: Ambulatory Visit | Attending: Gastroenterology | Admitting: Gastroenterology

## 2021-03-30 ENCOUNTER — Other Ambulatory Visit: Payer: Self-pay

## 2021-03-30 DIAGNOSIS — L905 Scar conditions and fibrosis of skin: Secondary | ICD-10-CM | POA: Diagnosis not present

## 2021-03-30 DIAGNOSIS — R1310 Dysphagia, unspecified: Secondary | ICD-10-CM | POA: Diagnosis not present

## 2021-03-30 DIAGNOSIS — C44722 Squamous cell carcinoma of skin of right lower limb, including hip: Secondary | ICD-10-CM | POA: Diagnosis not present

## 2021-03-31 ENCOUNTER — Encounter: Payer: Self-pay | Admitting: Family Medicine

## 2021-04-01 ENCOUNTER — Encounter: Payer: Self-pay | Admitting: Urology

## 2021-04-01 DIAGNOSIS — U071 COVID-19: Secondary | ICD-10-CM | POA: Diagnosis not present

## 2021-04-01 DIAGNOSIS — J9601 Acute respiratory failure with hypoxia: Secondary | ICD-10-CM | POA: Diagnosis not present

## 2021-04-05 ENCOUNTER — Inpatient Hospital Stay: Payer: Medicare HMO | Attending: Oncology

## 2021-04-05 ENCOUNTER — Other Ambulatory Visit: Payer: Self-pay

## 2021-04-05 DIAGNOSIS — E538 Deficiency of other specified B group vitamins: Secondary | ICD-10-CM | POA: Diagnosis not present

## 2021-04-05 DIAGNOSIS — Z23 Encounter for immunization: Secondary | ICD-10-CM

## 2021-04-05 MED ORDER — CYANOCOBALAMIN 1000 MCG/ML IJ SOLN
1000.0000 ug | INTRAMUSCULAR | Status: DC
  Administered 2021-04-05: 1000 ug via INTRAMUSCULAR
  Filled 2021-04-05: qty 1

## 2021-04-06 ENCOUNTER — Encounter: Payer: Self-pay | Admitting: Physician Assistant

## 2021-04-06 ENCOUNTER — Telehealth: Payer: Self-pay

## 2021-04-06 ENCOUNTER — Ambulatory Visit: Payer: Medicare HMO | Admitting: Gastroenterology

## 2021-04-06 ENCOUNTER — Ambulatory Visit: Payer: Medicare HMO | Admitting: Physician Assistant

## 2021-04-06 VITALS — BP 133/84 | HR 101 | Ht 61.0 in | Wt 138.0 lb

## 2021-04-06 DIAGNOSIS — M48061 Spinal stenosis, lumbar region without neurogenic claudication: Secondary | ICD-10-CM | POA: Diagnosis not present

## 2021-04-06 DIAGNOSIS — R300 Dysuria: Secondary | ICD-10-CM

## 2021-04-06 DIAGNOSIS — N393 Stress incontinence (female) (male): Secondary | ICD-10-CM

## 2021-04-06 DIAGNOSIS — N3946 Mixed incontinence: Secondary | ICD-10-CM | POA: Diagnosis not present

## 2021-04-06 LAB — URINALYSIS, COMPLETE
Bilirubin, UA: NEGATIVE
Glucose, UA: NEGATIVE
Ketones, UA: NEGATIVE
Leukocytes,UA: NEGATIVE
Nitrite, UA: NEGATIVE
Protein,UA: NEGATIVE
RBC, UA: NEGATIVE
Specific Gravity, UA: 1.015 (ref 1.005–1.030)
Urobilinogen, Ur: 1 mg/dL (ref 0.2–1.0)
pH, UA: 7 (ref 5.0–7.5)

## 2021-04-06 LAB — MICROSCOPIC EXAMINATION: Bacteria, UA: NONE SEEN

## 2021-04-06 LAB — BLADDER SCAN AMB NON-IMAGING

## 2021-04-06 NOTE — Telephone Encounter (Signed)
ERROR

## 2021-04-06 NOTE — Progress Notes (Signed)
04/06/2021 2:51 PM   Denise Watts 09-24-1955 [id number]  CC: Chief Complaint  Patient presents with   Follow-up   HPI: Denise Watts is a 65 y.o. female with PMH OAB wet with mixed urge and stress incontinence on oxybutynin XL 15 mg daily, lumbar stenosis previously referred to spine surgery, and right adrenal mass s/p resection who presents today for symptom recheck.   Today she reports she never heard anything from the spine surgery office about scheduling an appointment.  She remains on oxybutynin, but is unsure if this is helping.  She states she had some dysuria last week and was concerned for UTI.  She states she is taking a "strong antibiotic" prescribed by another provider, however she does not know the name of the antibiotic or which provider prescribed it.    She denies gross hematuria, fever, chills, or vomiting.  She has been having some issues with nausea and regurgitation.  In-office UA and microscopy today pan negative. PVR 16mL.  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   Cough 03/02/2017   INTERMTTENT DRY COUGH   Depression    GERD (gastroesophageal reflux disease)    History of hiatal hernia    Personal history of chemotherapy    Personal history of radiation therapy    Skin cancer    Throat cancer ([place]) 1998   radiation    Surgical History: Past Surgical History:  Procedure Laterality Date   BREAST BIOPSY Right 1997   positive   BREAST BIOPSY Right 11/15/2017   INVASIVE MAMMARY CARCINOMA triple negative   BREAST BIOPSY Right 07/20/2020   [place] bx, Q marker, neg   BREAST LUMPECTOMY Right 1997   2019 also   BREAST LUMPECTOMY WITH SENTINEL LYMPH NODE BIOPSY Right 12/08/2017   Procedure: BREAST LUMPECTOMY WITH SENTINEL LYMPH  NODE BX;  Surgeon: Robert Bellow, MD;  Location: [facility];  Service: General;  Laterality: Right;   BREAST REDUCTION WITH MASTOPEXY Left 06/10/2019   Procedure: Left breast mastopexy reduction for symmetry;  Surgeon: Wallace Going, DO;  Location: [place];  Service: Plastics;  Laterality: Left;  total 2.5 hours   COLONOSCOPY  2015   COLONOSCOPY WITH PROPOFOL N/A 10/29/2019   Procedure: COLONOSCOPY WITH PROPOFOL;  Surgeon: Lucilla Lame, MD;  Location: [facility];  Service: Endoscopy;  Laterality: N/A;   ESOPHAGOGASTRODUODENOSCOPY (EGD) WITH PROPOFOL N/A 05/24/2019   Procedure: ESOPHAGOGASTRODUODENOSCOPY (EGD) WITH PROPOFOL;  Surgeon: Lucilla Lame, MD;  Location: [facility];  Service: Endoscopy;  Laterality: N/A;   ESOPHAGUS SURGERY     KNEE SURGERY Right    LIPOSUCTION WITH LIPOFILLING Right 06/10/2019   Procedure: right breast scar and capsule release with fat grafting;  Surgeon: Wallace Going, DO;  Location: [place];  Service: Plastics;  Laterality: Right;   PORT-A-CATH REMOVAL     PORTACATH PLACEMENT Right 01/17/2018   Procedure: INSERTION PORT-A-CATH- RIGHT;  Surgeon: Robert Bellow, MD;  Location: [facility];  Service: General;  Laterality: Right;   ROBOTIC ADRENALECTOMY Right 12/28/2016   Procedure: ROBOTIC ADRENALECTOMY;  Surgeon: Hollice Espy, MD;  Location: [facility];  Service: Urology;  Laterality: Right;   SKIN CANCER EXCISION     THROAT SURGERY  1998   throat cancer    TUBAL LIGATION     VENTRAL HERNIA REPAIR N/A 03/03/2017   Procedure: HERNIA REPAIR VENTRAL ADULT;  Surgeon: Clayburn Pert, MD;  Location: [facility];  Service: General;  Laterality: N/A;    Home Medications:  Allergies as of 04/06/2021       Reactions   Sulfa Antibiotics Other (See Comments)   Mouth sore and raw        Medication List        Accurate as of April 06, 2021  2:51 PM. If you have any questions, ask your nurse or doctor.           albuterol 108 (90 Base) MCG/ACT inhaler Commonly known as: VENTOLIN HFA Inhale 2 puffs into the lungs every 6 (six) hours as needed for shortness of breath.   aspirin EC 81 MG tablet Take 1 tablet (81 mg total) by mouth daily. Swallow whole.   atorvastatin 40 MG tablet Commonly known as: LIPITOR Take 1 tablet (40 mg total) by mouth daily.   benzonatate 100 MG capsule Commonly known as: Tessalon Perles Take 1-2 tabs TID prn cough   Breztri Aerosphere 160-9-4.8 MCG/ACT Aero Generic drug: Budeson-Glycopyrrol-Formoterol Inhale 2 puffs into the lungs in the morning and at bedtime.   brompheniramine-pseudoephedrine-DM 30-2-10 MG/5ML syrup Take 5 mLs by mouth 4 (four) times daily as needed.   busPIRone 5 MG tablet Commonly known as: BUSPAR Take 1 tablet (5 mg total) by mouth 2 (two) times daily.   cyclobenzaprine 5 MG tablet Commonly known as: FLEXERIL TAKE 1 TABLET BY MOUTH 3 TIMES DAILY AS NEEDED FOR MUSCLE SPASMS   DULoxetine 60 MG capsule Commonly known as: CYMBALTA Take by mouth.   fluconazole 100 MG tablet Commonly known as: DIFLUCAN Take 1 tablet (100 mg total) by mouth daily.   fluticasone 50 MCG/ACT nasal spray Commonly known as: FLONASE Place 1 spray into both nostrils at bedtime.   gabapentin 300 MG capsule Commonly known as: NEURONTIN Take 1 capsule (300 mg total) by mouth 3 (three) times daily.   HYDROcodone-acetaminophen 5-325 MG tablet Commonly known as: NORCO/VICODIN Take 1 tablet by mouth every 4 (four) hours as needed for moderate pain.   omeprazole 40 MG capsule Commonly known as: PRILOSEC Take 40 mg by mouth in the morning and at bedtime.   ondansetron 4 MG disintegrating tablet Commonly known as: Zofran ODT Take 1 tablet (4 mg total) by mouth every 8 (eight) hours as needed.   ondansetron 4 MG tablet Commonly known as: Zofran Take 1 tablet (4 mg total) by mouth every 8 (eight) hours as needed for nausea or vomiting.   oxybutynin  15 MG 24 hr tablet Commonly known as: DITROPAN XL Take 1 tablet (15 mg total) by mouth daily.   rOPINIRole 0.25 MG tablet Commonly known as: REQUIP TAKE 1 TO 2 TABLETS BY MOUTH AT BEDTIME FOR RESTLESS LEGS        Allergies:  Allergies  Allergen Reactions   Sulfa Antibiotics Other (See Comments)    Mouth sore and raw    Family History: Family History  Problem Relation Age of Onset   Diabetes Mother    Heart attack Mother    Cancer Sister        cancer of eyes, spread to lung, other places. died at 35   Cancer Sister 62       unknown primary, spread to bone, brain died in 1's   Cancer Paternal Aunt  unk type   Cancer Paternal Grandmother        type unk   Prostate cancer Neg Hx    Kidney cancer Neg Hx    Bladder Cancer Neg Hx     Social History:   reports that she quit smoking about 25 years ago. Her smoking use included cigarettes. She has a 45.00 pack-year smoking history. She has never used smokeless tobacco. She reports current alcohol use of about 4.0 standard drinks per week. She reports that she does not use drugs.  Physical Exam: BP 133/84   Pulse (!) 101   Ht 5\' 1"  (1.549 m)   Wt 138 lb (62.6 kg)   BMI 26.07 kg/m   Constitutional:  Alert and oriented, no acute distress, nontoxic appearing HEENT: [place], AT Cardiovascular: No clubbing, cyanosis, or edema Respiratory: Normal respiratory effort, no increased work of breathing Skin: No rashes, bruises or suspicious lesions Neurologic: Grossly intact, no focal deficits, moving all 4 extremities Psychiatric: Normal mood and affect  Laboratory Data: Results for orders placed or performed in visit on 04/06/21  Microscopic Examination   Urine  Result Value Ref Range   WBC, UA 0-5 0 - 5 /hpf   RBC 0-2 0 - 2 /hpf   Epithelial Cells (non renal) 0-10 0 - 10 /hpf   Bacteria, UA None seen None seen/Few  Urinalysis, Complete  Result Value Ref Range   Specific Gravity, UA 1.015 1.005 - 1.030   pH, UA 7.0  5.0 - 7.5   Color, UA Yellow Yellow   Appearance Ur Clear Clear   Leukocytes,UA Negative Negative   Protein,UA Negative Negative/Trace   Glucose, UA Negative Negative   Ketones, UA Negative Negative   RBC, UA Negative Negative   Bilirubin, UA Negative Negative   Urobilinogen, Ur 1.0 0.2 - 1.0 mg/dL   Nitrite, UA Negative Negative   Microscopic Examination See below:   Bladder Scan (Post Void Residual) in office  Result Value Ref Range   Scan Result 66mL    Assessment & Plan:   1. Mixed incontinence urge and stress We discussed that oxybutynin is expected to help with urge incontinence, but not stress incontinence.  I offered her a referral for pelvic floor physical therapy versus consultation with Dr. Matilde Sprang today, however she prefers to defer both of these pending follow-up with spine surgery as below.  We will continue oxybutynin in the meantime - Bladder Scan (Post Void Residual) in office  2. Dysuria UA today is benign, possibly due to her current antibiotic, however per chart review I cannot tell which antibiotic she is currently taking.  No further intervention indicated. - Urinalysis, Complete  3. Foraminal stenosis of lumbar region Spine surgery clinic states that they left the patient to voicemails to schedule and she never called them back.  We will have our staff contact her and give her the spine surgery phone number to reach out for scheduling.  Return in about 3 months (around 07/05/2021) for Symptom recheck and f/u after seeing neurosurgery.  Debroah Loop, PA-C  [facility] [address] [place],  [address] [phone]

## 2021-04-07 ENCOUNTER — Other Ambulatory Visit: Payer: Self-pay | Admitting: Primary Care

## 2021-04-07 ENCOUNTER — Telehealth: Payer: Self-pay

## 2021-04-07 DIAGNOSIS — J432 Centrilobular emphysema: Secondary | ICD-10-CM

## 2021-04-07 DIAGNOSIS — R053 Chronic cough: Secondary | ICD-10-CM

## 2021-04-07 NOTE — Telephone Encounter (Signed)
-----   Message from Lucilla Lame, MD sent at 04/07/2021  6:57 AM EST ----- Please let the patient know that there was no stricture in the esophagus but there were abnormal contractions and a single episode of some fluid going into her windpipe.  There is nothing a upper endoscopy can improve but if she is still choking then she should be set up for a modified barium swallow with speech pathology.

## 2021-04-07 NOTE — Telephone Encounter (Signed)
Left vm for pt to return my call. Results also sent to [place].

## 2021-04-13 DIAGNOSIS — D0471 Carcinoma in situ of skin of right lower limb, including hip: Secondary | ICD-10-CM | POA: Diagnosis not present

## 2021-04-13 DIAGNOSIS — L905 Scar conditions and fibrosis of skin: Secondary | ICD-10-CM | POA: Diagnosis not present

## 2021-04-13 DIAGNOSIS — C44722 Squamous cell carcinoma of skin of right lower limb, including hip: Secondary | ICD-10-CM | POA: Diagnosis not present

## 2021-04-20 ENCOUNTER — Telehealth: Payer: Self-pay | Admitting: Family Medicine

## 2021-04-20 DIAGNOSIS — G2581 Restless legs syndrome: Secondary | ICD-10-CM

## 2021-04-20 MED ORDER — ROPINIROLE HCL 0.25 MG PO TABS: ORAL_TABLET | ORAL | 1 refills | Status: DC

## 2021-04-20 NOTE — Telephone Encounter (Signed)
[facility] faxed refill request for the following medications:  rOPINIRole (REQUIP) 0.25 MG tablet   Please advise.

## 2021-04-20 NOTE — Telephone Encounter (Signed)
Please review

## 2021-04-22 ENCOUNTER — Other Ambulatory Visit: Payer: Self-pay | Admitting: Family Medicine

## 2021-04-22 NOTE — Telephone Encounter (Signed)
Last refill: 01/19/21  #30  3 refills  Last office visit: 03/02/2021   Future visit scheduled yes  06/30/21

## 2021-04-22 NOTE — Telephone Encounter (Signed)
Requested medication (s) are due for refill today: Yes  Requested medication (s) are on the active medication list: yes    Last refill: 01/19/21  #30  3 refills  Future visit scheduled yes  06/30/21  Notes to clinic:Not delegated  Requested Prescriptions  Pending Prescriptions Disp Refills   cyclobenzaprine (FLEXERIL) 5 MG tablet [Pharmacy Med Name: CYCLOBENZAPRINE HCL 5 MG TAB] 30 tablet 3    Sig: TAKE 1 TABLET BY MOUTH 3 TIMES DAILY AS NEEDED FOR MUSCLE SPASMS     Not Delegated - Analgesics:  Muscle Relaxants Failed - 04/22/2021 12:35 PM      Failed - This refill cannot be delegated      Passed - Valid encounter within last 6 months    Recent Outpatient Visits           1 month ago Acute hypoxemic respiratory failure due to COVID-19 [facility])   [facility] [place] Banana., MD   2 months ago Chronic cough   [facility] [place], [place], PA-C   3 months ago Lumbar radiculopathy   [facility] Jerrol Banana., MD   6 months ago Anxiety and depression   [place], PA-C   9 months ago Upper respiratory tract infection, unspecified type   [facility], [place]arn[place] Sorrel, [place]       Future Appointments             In 1 month Agbor-Etang, Aaron Edelman, MD [facility], [place]   In 2 months Jerrol Banana., MD [facility], [place]   In 2 months McG[place]an, Gordan Payment [facility]

## 2021-04-24 DIAGNOSIS — U071 COVID-19: Secondary | ICD-10-CM | POA: Diagnosis not present

## 2021-05-06 ENCOUNTER — Inpatient Hospital Stay: Payer: Medicare HMO | Attending: Oncology

## 2021-05-06 ENCOUNTER — Other Ambulatory Visit: Payer: Self-pay

## 2021-05-06 DIAGNOSIS — E538 Deficiency of other specified B group vitamins: Secondary | ICD-10-CM | POA: Diagnosis not present

## 2021-05-06 DIAGNOSIS — Z23 Encounter for immunization: Secondary | ICD-10-CM

## 2021-05-06 MED ORDER — CYANOCOBALAMIN 1000 MCG/ML IJ SOLN
1000.0000 ug | INTRAMUSCULAR | Status: DC
  Administered 2021-05-06: 1000 ug via INTRAMUSCULAR
  Filled 2021-05-06: qty 1

## 2021-05-25 ENCOUNTER — Ambulatory Visit: Payer: Self-pay

## 2021-05-25 DIAGNOSIS — U071 COVID-19: Secondary | ICD-10-CM | POA: Diagnosis not present

## 2021-05-25 NOTE — Telephone Encounter (Signed)
°  Chief Complaint: cough Symptoms: cough, congestion, nausea Frequency: 2 weeks Pertinent Negatives: NA Disposition: [] ED /[] Urgent Care (no appt availability in office) / [x] Appointment(In office/virtual)/ []  [facility]/ [] Home Care/ [] Refused Recommended Disposition /[] [facility]/ []  Follow-up with PCP Additional Notes:    Summary: cough, nausea   Feel nausea, no temp, coughing and sore throat for 1 week but has gotten worst.       Reason for Disposition  Cough has been present for > 3 weeks  Answer Assessment - Initial Assessment Questions 1. ONSET: "When did the cough begin?"      2 weeks ago 3. SPUTUM: "Describe the color of your sputum" (none, dry cough; clear, white, yellow, green)     yellow 4. HEMOPTYSIS: "Are you coughing up any blood?" If so ask: "How much?" (flecks, streaks, tablespoons, etc.)     no 5. DIFFICULTY BREATHING: "Are you having difficulty breathing?" If Yes, ask: "How bad is it?" (e.g., mild, moderate, severe)    - MILD: No SOB at rest, mild SOB with walking, speaks normally in sentences, can lie down, no retractions, pulse < 100.    - MODERATE: SOB at rest, SOB with minimal exertion and prefers to sit, cannot lie down flat, speaks in phrases, mild retractions, audible wheezing, pulse 100-120.    - SEVERE: Very SOB at rest, speaks in single words, struggling to breathe, sitting hunched forward, retractions, pulse > 120      no 6. FEVER: "Do you have a fever?" If Yes, ask: "What is your temperature, how was it measured, and when did it start?"     no 10. OTHER SYMPTOMS: "Do you have any other symptoms?" (e.g., runny nose, wheezing, chest pain)       Congestion, nausea  Protocols used: Cough - Acute Productive-A-AH

## 2021-05-26 ENCOUNTER — Other Ambulatory Visit: Payer: Self-pay

## 2021-05-26 ENCOUNTER — Ambulatory Visit (INDEPENDENT_AMBULATORY_CARE_PROVIDER_SITE_OTHER): Payer: Medicare HMO | Admitting: Family Medicine

## 2021-05-26 ENCOUNTER — Encounter: Payer: Self-pay | Admitting: Family Medicine

## 2021-05-26 ENCOUNTER — Ambulatory Visit
Admission: RE | Admit: 2021-05-26 | Discharge: 2021-05-26 | Disposition: A | Discharge status: Home / Self Care | Payer: Medicare HMO | Attending: Family Medicine | Admitting: Family Medicine

## 2021-05-26 ENCOUNTER — Ambulatory Visit
Admission: RE | Admit: 2021-05-26 | Discharge: 2021-05-26 | Disposition: A | Discharge status: Home / Self Care | Payer: Medicare HMO | Source: Ambulatory Visit | Attending: Family Medicine | Admitting: Family Medicine

## 2021-05-26 VITALS — BP 105/66 | HR 101 | Resp 20 | Wt 140.7 lb

## 2021-05-26 DIAGNOSIS — J439 Emphysema, unspecified: Secondary | ICD-10-CM | POA: Diagnosis not present

## 2021-05-26 DIAGNOSIS — J209 Acute bronchitis, unspecified: Secondary | ICD-10-CM | POA: Insufficient documentation

## 2021-05-26 DIAGNOSIS — E785 Hyperlipidemia, unspecified: Secondary | ICD-10-CM | POA: Insufficient documentation

## 2021-05-26 DIAGNOSIS — Z23 Encounter for immunization: Secondary | ICD-10-CM

## 2021-05-26 DIAGNOSIS — R059 Cough, unspecified: Secondary | ICD-10-CM | POA: Diagnosis not present

## 2021-05-26 DIAGNOSIS — E782 Mixed hyperlipidemia: Secondary | ICD-10-CM

## 2021-05-26 DIAGNOSIS — F419 Anxiety disorder, unspecified: Secondary | ICD-10-CM | POA: Diagnosis not present

## 2021-05-26 DIAGNOSIS — H9201 Otalgia, right ear: Secondary | ICD-10-CM | POA: Diagnosis not present

## 2021-05-26 DIAGNOSIS — J440 Chronic obstructive pulmonary disease with acute lower respiratory infection: Secondary | ICD-10-CM

## 2021-05-26 DIAGNOSIS — R911 Solitary pulmonary nodule: Secondary | ICD-10-CM | POA: Diagnosis not present

## 2021-05-26 DIAGNOSIS — R7303 Prediabetes: Secondary | ICD-10-CM | POA: Diagnosis not present

## 2021-05-26 DIAGNOSIS — R0902 Hypoxemia: Secondary | ICD-10-CM | POA: Diagnosis not present

## 2021-05-26 DIAGNOSIS — F32A Depression, unspecified: Secondary | ICD-10-CM | POA: Diagnosis not present

## 2021-05-26 DIAGNOSIS — R0600 Dyspnea, unspecified: Secondary | ICD-10-CM | POA: Diagnosis not present

## 2021-05-26 MED ORDER — BUSPIRONE HCL 5 MG PO TABS: 5.0000 mg | ORAL_TABLET | Freq: Two times a day (BID) | ORAL | 3 refills | Status: DC

## 2021-05-26 MED ORDER — AMOXICILLIN-POT CLAVULANATE 875-125 MG PO TABS: 1.0000 | ORAL_TABLET | Freq: Two times a day (BID) | ORAL | 0 refills | Status: DC

## 2021-05-26 MED ORDER — DULOXETINE HCL 60 MG PO CPEP: 60.0000 mg | ORAL_CAPSULE | Freq: Every day | ORAL | 3 refills | Status: DC

## 2021-05-26 MED ORDER — PREDNISONE 10 MG (21) PO TBPK: ORAL_TABLET | ORAL | 0 refills | Status: DC

## 2021-05-26 MED ORDER — MONTELUKAST SODIUM 10 MG PO TABS: 10.0000 mg | ORAL_TABLET | Freq: Every day | ORAL | 3 refills | Status: DC

## 2021-05-26 MED ORDER — AZITHROMYCIN 250 MG PO TABS: ORAL_TABLET | ORAL | 0 refills | Status: AC

## 2021-05-26 MED ORDER — METHYLPREDNISOLONE ACETATE 40 MG/ML IJ SUSP
40.0000 mg | Freq: Once | INTRAMUSCULAR | Status: AC
  Administered 2021-05-26: 40 mg via INTRAMUSCULAR

## 2021-05-26 NOTE — Assessment & Plan Note (Signed)
2nd vaccine provided

## 2021-05-26 NOTE — Assessment & Plan Note (Signed)
Repeat lipid panel ?

## 2021-05-26 NOTE — Assessment & Plan Note (Signed)
Off medication; request to restart Restart at previous dose Discussed potential side effects, incl GI upset, sexual dysfunction, increased anxiety, and SI Discussed that it can take 6-8 weeks to reach full efficacy Contracted for safety - no SI/HI Discussed synergistic effects of medications and therapy

## 2021-05-26 NOTE — Assessment & Plan Note (Signed)
Provided; encourage earlier vaccination next year

## 2021-05-26 NOTE — Assessment & Plan Note (Signed)
Exam deferred in light of respiratory priority -antibiotic will assist if infection present

## 2021-05-26 NOTE — Assessment & Plan Note (Signed)
Denies sick contacts Not vaccinated for flu or pna Did get Covid vaccine Treat with abx today Get stat cxr Lungs clear; however, very diminished d/t flare of COPD Advised to seek Xray and go home for O2- have husband go out to get Rx and portable SPO2 monitor

## 2021-05-26 NOTE — Assessment & Plan Note (Addendum)
Chronic, unstable Steroid injection in office Taper starting 1/26 abx to treat possible infection cxr pending Start singulair encourage pt to f/u with pulm  Reinforce o2 continuous use at home and out until seen and cleared by pulm; pt contracted in agreement Placed on 4LPM in office- weaned to 2LPM, sats in low 90s with talking.

## 2021-05-26 NOTE — Patient Instructions (Signed)
Get your Xray: It is ordered STAT Go Home and Apply your Oxygen Keep your Oxygen on at All Times- Your lungs still need time to recover from the Covid Viral Infection

## 2021-05-26 NOTE — Progress Notes (Signed)
Established patient visit   Patient: Denise Watts   DOB: 02/12/1956   66 y.o. Female  MRN: [id number] Visit Date: 05/26/2021  Today's healthcare provider: [name], FNP   Chief Complaint  Patient presents with   Cough   Subjective    Cough This is a new problem. The current episode started in the past 7 days. The problem has been gradually worsening. The cough is Productive of sputum. Associated symptoms include ear pain (right), headaches, heartburn, myalgias, nasal congestion and shortness of breath. Pertinent negatives include no chest pain, chills, ear congestion, fever, hemoptysis, postnasal drip, rash, rhinorrhea, sore throat, sweats, weight loss or wheezing. She has tried steroid inhaler for the symptoms. Her past medical history is significant for emphysema.     Medications: Outpatient Medications Prior to Visit  Medication Sig   albuterol (VENTOLIN HFA) 108 (90 Base) MCG/ACT inhaler Inhale 2 puffs into the lungs every 6 (six) hours as needed for shortness of breath.   aspirin EC 81 MG tablet Take 1 tablet (81 mg total) by mouth daily. Swallow whole.   benzonatate (TESSALON) 100 MG capsule TAKE 2 CAPSULES (200 MG TOTAL) BY MOUTH 3 TIMES DAILY   brompheniramine-pseudoephedrine-DM 30-2-10 MG/5ML syrup Take 5 mLs by mouth 4 (four) times daily as needed.   Budeson-Glycopyrrol-Formoterol (BREZTRI AEROSPHERE) 160-9-4.8 MCG/ACT AERO Inhale 2 puffs into the lungs in the morning and at bedtime.   cyclobenzaprine (FLEXERIL) 5 MG tablet TAKE 1 TABLET BY MOUTH 3 TIMES DAILY AS NEEDED FOR MUSCLE SPASMS   fluconazole (DIFLUCAN) 100 MG tablet Take 1 tablet (100 mg total) by mouth daily.   fluticasone (FLONASE) 50 MCG/ACT nasal spray Place 1 spray into both nostrils at bedtime.   gabapentin (NEURONTIN) 300 MG capsule Take 1 capsule (300 mg total) by mouth 3 (three) times daily.   HYDROcodone-acetaminophen (NORCO/VICODIN) 5-325 MG tablet Take 1 tablet by mouth every 4 (four) hours as  needed for moderate pain.   omeprazole (PRILOSEC) 40 MG capsule Take 40 mg by mouth in the morning and at bedtime.   ondansetron (ZOFRAN ODT) 4 MG disintegrating tablet Take 1 tablet (4 mg total) by mouth every 8 (eight) hours as needed.   ondansetron (ZOFRAN) 4 MG tablet Take 1 tablet (4 mg total) by mouth every 8 (eight) hours as needed for nausea or vomiting.   oxybutynin (DITROPAN XL) 15 MG 24 hr tablet Take 1 tablet (15 mg total) by mouth daily.   rOPINIRole (REQUIP) 0.25 MG tablet TAKE 1 TO 2 TABLETS BY MOUTH AT BEDTIME FOR RESTLESS LEGS   [DISCONTINUED] busPIRone (BUSPAR) 5 MG tablet Take 1 tablet (5 mg total) by mouth 2 (two) times daily.   [DISCONTINUED] DULoxetine (CYMBALTA) 60 MG capsule Take by mouth.   atorvastatin (LIPITOR) 40 MG tablet Take 1 tablet (40 mg total) by mouth daily.   Facility-Administered Medications Prior to Visit  Medication Dose Route Frequency Provider   cyanocobalamin ((VITAMIN B-12)) injection 1,000 mcg  1,000 mcg Intramuscular Q30 days Sindy Guadeloupe, MD    Review of Systems  Constitutional:  Negative for chills, fever and weight loss.  HENT:  Positive for ear pain (right). Negative for postnasal drip, rhinorrhea and sore throat.   Respiratory:  Positive for cough and shortness of breath. Negative for hemoptysis and wheezing.   Cardiovascular:  Negative for chest pain.  Gastrointestinal:  Positive for heartburn.  Musculoskeletal:  Positive for myalgias.  Skin:  Negative for rash.  Neurological:  Positive for headaches.  Objective    BP 105/66    Pulse (!) 101    Resp 20    Wt 140 lb 11.2 oz (63.8 kg)    SpO2 (!) 86%    BMI 26.59 kg/m    Physical Exam Vitals and nursing note reviewed.  Constitutional:      General: She is in acute distress.     Appearance: Normal appearance. She is well-developed, well-groomed and overweight. She is not ill-appearing, toxic-appearing or diaphoretic.  HENT:     Head: Normocephalic and atraumatic.   Cardiovascular:     Rate and Rhythm: Regular rhythm. Tachycardia present.     Pulses: Normal pulses.     Heart sounds: Normal heart sounds. No murmur heard.   No friction rub. No gallop.  Pulmonary:     Effort: Tachypnea, accessory muscle usage, prolonged expiration and respiratory distress present.     Breath sounds: Decreased air movement present. No stridor. Examination of the right-middle field reveals decreased breath sounds. Examination of the left-middle field reveals decreased breath sounds. Examination of the left-lower field reveals decreased breath sounds. Decreased breath sounds present. No wheezing, rhonchi or rales.  Chest:     Chest wall: No tenderness.  Abdominal:     General: Bowel sounds are normal.     Palpations: Abdomen is soft.  Musculoskeletal:        General: No swelling, tenderness, deformity or signs of injury. Normal range of motion.     Right lower leg: No edema.     Left lower leg: No edema.  Skin:    General: Skin is warm and dry.     Capillary Refill: Capillary refill takes less than 2 seconds.     Coloration: Skin is not jaundiced or pale.     Findings: No bruising, erythema, lesion or rash.  Neurological:     General: No focal deficit present.     Mental Status: She is alert and oriented to person, place, and time. Mental status is at baseline.     Cranial Nerves: No cranial nerve deficit.     Sensory: No sensory deficit.     Motor: No weakness.     Coordination: Coordination normal.  Psychiatric:        Mood and Affect: Mood normal.        Behavior: Behavior normal.        Thought Content: Thought content normal.        Judgment: Judgment normal.     No results found for any visits on 05/26/21.  Assessment & Plan     Problem List Items Addressed This Visit       Respiratory   Acute bronchitis with COPD ([place]) - Primary    Denies sick contacts Not vaccinated for flu or pna Did get Covid vaccine Treat with abx today Get stat cxr Lungs  clear; however, very diminished d/t flare of COPD Advised to seek Xray and go home for O2- have husband go out to get Rx and portable SPO2 monitor      Relevant Medications   montelukast (SINGULAIR) 10 MG tablet   azithromycin (ZITHROMAX) 250 MG tablet   amoxicillin-clavulanate (AUGMENTIN) 875-125 MG tablet   predniSONE (STERAPRED UNI-PAK 21 TAB) 10 MG (21) TBPK tablet (Start on 05/27/2021)   Other Relevant Orders   DG Chest 2 View (Completed)   CBC with Differential/Platelet   Comprehensive metabolic panel   Pulmonary emphysema ([place])    Chronic, unstable Steroid injection in office Taper starting 1/26 abx  to treat possible infection cxr pending Start singulair encourage pt to f/u with pulm  Reinforce o2 continuous use at home and out until seen and cleared by pulm; pt contracted in agreement      Relevant Medications   montelukast (SINGULAIR) 10 MG tablet   azithromycin (ZITHROMAX) 250 MG tablet   predniSONE (STERAPRED UNI-PAK 21 TAB) 10 MG (21) TBPK tablet (Start on 05/27/2021)   Other Relevant Orders   DG Chest 2 View (Completed)     Other   Anxiety and depression    Off medication; request to restart Restart at previous dose Discussed potential side effects, incl GI upset, sexual dysfunction, increased anxiety, and SI Discussed that it can take 6-8 weeks to reach full efficacy Contracted for safety - no SI/HI Discussed synergistic effects of medications and therapy       Relevant Medications   busPIRone (BUSPAR) 5 MG tablet   DULoxetine (CYMBALTA) 60 MG capsule   Need for influenza vaccination    Provided; encourage earlier vaccination next year      Relevant Orders   Flu Vaccine QUAD High Dose(Fluad) (Completed)   Need for prophylactic vaccination against Streptococcus pneumoniae (pneumococcus)    2nd vaccine provided      Relevant Orders   Pneumococcal conjugate vaccine 20-valent (Completed)   Prediabetes    Repeat a1c; likely to increase BG levels  shortly given steroid use      Relevant Orders   Hemoglobin A1c   Mixed hyperlipidemia    Repeat lipid panel       Relevant Orders   Lipid panel   Right ear pain    Exam deferred in light of respiratory priority -antibiotic will assist if infection present         Return in about 1 week (around 06/02/2021) for 1 week; check breathing.      Vonna Kotyk, FNP, have reviewed all documentation for this visit. The documentation on 05/26/21 for the exam, diagnosis, procedures, and orders are all accurate and complete.  Aida Raider, acting as a Education administrator for [name], FNP.,have documented all relevant documentation on the behalf of [name], FNP,as directed by  [name], FNP while in the presence of [name], FNP.   [name], [place] [phone] (phone) [phone] (fax)  [place]

## 2021-05-26 NOTE — Assessment & Plan Note (Signed)
Repeat a1c; likely to increase BG levels shortly given steroid use

## 2021-05-27 NOTE — Progress Notes (Signed)
Normal CXR Chemistry is stable A1c is stable and improved No signs of infection on lab work; however, continue antibiotics Lipid panel improved- stable. Start prednisone taper today 1/26 if you haven't; also, reach back out to pulm for f/u appt.  Please let us know if you have any questions.  Thank you,  Tally Joe, FNP

## 2021-05-28 ENCOUNTER — Other Ambulatory Visit: Payer: Self-pay | Admitting: Primary Care

## 2021-05-28 DIAGNOSIS — J432 Centrilobular emphysema: Secondary | ICD-10-CM

## 2021-05-28 DIAGNOSIS — R053 Chronic cough: Secondary | ICD-10-CM

## 2021-05-29 LAB — COMPREHENSIVE METABOLIC PANEL
ALT: 12 IU/L (ref 0–32)
AST: 21 IU/L (ref 0–40)
Albumin/Globulin Ratio: 1.7 (ref 1.2–2.2)
Albumin: 4.1 g/dL (ref 3.8–4.8)
Alkaline Phosphatase: 100 IU/L (ref 44–121)
BUN/Creatinine Ratio: 11 — ABNORMAL LOW (ref 12–28)
BUN: 8 mg/dL (ref 8–27)
Bilirubin Total: 0.3 mg/dL (ref 0.0–1.2)
CO2: 24 mmol/L (ref 20–29)
Calcium: 9.6 mg/dL (ref 8.7–10.3)
Chloride: 101 mmol/L (ref 96–106)
Creatinine, Ser: 0.76 mg/dL (ref 0.57–1.00)
Globulin, Total: 2.4 g/dL (ref 1.5–4.5)
Glucose: 95 mg/dL (ref 70–99)
Potassium: 4.4 mmol/L (ref 3.5–5.2)
Sodium: 143 mmol/L (ref 134–144)
Total Protein: 6.5 g/dL (ref 6.0–8.5)
eGFR: 86 mL/min/{1.73_m2} (ref >59)

## 2021-05-29 LAB — CBC WITH DIFFERENTIAL/PLATELET
Basophils Absolute: 0.1 10*3/uL (ref 0.0–0.2)
Basos: 1 %
EOS (ABSOLUTE): 0.4 10*3/uL (ref 0.0–0.4)
Eos: 9 %
Hematocrit: 40.6 % (ref 34.0–46.6)
Hemoglobin: 12.9 g/dL (ref 11.1–15.9)
Immature Grans (Abs): 0 10*3/uL (ref 0.0–0.1)
Immature Granulocytes: 0 %
Lymphocytes Absolute: 1.1 10*3/uL (ref 0.7–3.1)
Lymphs: 25 %
MCH: 30.2 pg (ref 26.6–33.0)
MCHC: 31.8 g/dL (ref 31.5–35.7)
MCV: 95 fL (ref 79–97)
Monocytes Absolute: 0.5 10*3/uL (ref 0.1–0.9)
Monocytes: 10 %
Neutrophils Absolute: 2.5 10*3/uL (ref 1.4–7.0)
Neutrophils: 55 %
Platelets: 250 10*3/uL (ref 150–450)
RBC: 4.27 x10E6/uL (ref 3.77–5.28)
RDW: 11.8 % (ref 11.7–15.4)
WBC: 4.6 10*3/uL (ref 3.4–10.8)

## 2021-05-29 LAB — LIPID PANEL
Chol/HDL Ratio: 2.9 ratio (ref 0.0–4.4)
Cholesterol, Total: 135 mg/dL (ref 100–199)
HDL: 46 mg/dL (ref >39)
LDL Chol Calc (NIH): 68 mg/dL (ref 0–99)
Triglycerides: 115 mg/dL (ref 0–149)
VLDL Cholesterol Cal: 21 mg/dL (ref 5–40)

## 2021-05-29 LAB — HEMOGLOBIN A1C
Est. average glucose Bld gHb Est-mCnc: 114 mg/dL
Hgb A1c MFr Bld: 5.6 % (ref 4.8–5.6)

## 2021-06-02 ENCOUNTER — Telehealth: Payer: Self-pay | Admitting: *Deleted

## 2021-06-02 ENCOUNTER — Ambulatory Visit (INDEPENDENT_AMBULATORY_CARE_PROVIDER_SITE_OTHER): Payer: Medicare HMO | Admitting: Family Medicine

## 2021-06-02 ENCOUNTER — Encounter: Payer: Self-pay | Admitting: Family Medicine

## 2021-06-02 ENCOUNTER — Other Ambulatory Visit: Payer: Self-pay

## 2021-06-02 VITALS — BP 117/64 | HR 98 | Temp 98.0°F | Resp 16 | Wt 138.4 lb

## 2021-06-02 DIAGNOSIS — F4329 Adjustment disorder with other symptoms: Secondary | ICD-10-CM | POA: Diagnosis not present

## 2021-06-02 DIAGNOSIS — R457 State of emotional shock and stress, unspecified: Secondary | ICD-10-CM | POA: Insufficient documentation

## 2021-06-02 DIAGNOSIS — J439 Emphysema, unspecified: Secondary | ICD-10-CM

## 2021-06-02 DIAGNOSIS — R053 Chronic cough: Secondary | ICD-10-CM

## 2021-06-02 NOTE — Chronic Care Management (AMB) (Signed)
Chronic Care Management  ° °Note ° °06/02/2021 °Name: Elisabetta I Boyadjian MRN: [id number] DOB: 08/09/1955 ° °Myriah I Fennel is a 65 y.o. year old female who is a primary care patient of Gilbert, Richard L Jr., MD. I reached out to Rozalia I Malecha by phone today in response to a referral sent by Ms. Maame I Beechy's PCP. ° °Ms. Jacko was given information about Chronic Care Management services today including:  °CCM service includes personalized support from designated clinical staff supervised by her physician, including individualized plan of care and coordination with other care providers °24/7 contact phone numbers for assistance for urgent and routine care needs. °Service will only be billed when office clinical staff spend 20 minutes or more in a month to coordinate care. °Only one practitioner may furnish and bill the service in a calendar month. °The patient may stop CCM services at any time (effective at the end of the month) by phone call to the office staff. °The patient is responsible for co-pay (up to 20% after annual deductible is met) if co-pay is required by the individual health plan.  ° °Patient agreed to services and verbal consent obtained.  ° °Follow up plan: °Telephone appointment with care management team member scheduled for: 06/17/2021 ° °Staci Ashworth, CCMA °Care Guide, Embedded Care Coordination °[place]   Care Management  °Direct Dial: [phone] ° ° °

## 2021-06-02 NOTE — Assessment & Plan Note (Signed)
Continue medications F/u with pulm- call to schedule appt

## 2021-06-02 NOTE — Progress Notes (Signed)
Established patient visit   Patient: Denise Watts   DOB: [birth year]   66 y.o. Female  MRN: [id number] Visit Date: 06/02/2021  Today's healthcare provider: [name], FNP   Chief Complaint  Patient presents with   Follow-up   Subjective    HPI  Follow up for Pulmonary Emphysema   The patient was last seen for this 1 weeks ago. Changes made at last visit include patient was started on portable O2 and montelukast (SINGULAIR) 10 MG tablet,azithromycin (ZITHROMAX) 250 MG tablet,predniSONE (STERAPRED UNI-PAK 21 TAB) 10 MG (21) TBPK tablet (Start on 05/27/2021)  She reports excellent compliance with treatment. She feels that condition is Unchanged. Patient reports cough is improved but states that she still feels weak She is not having side effects.   -----------------------------------------------------------------------------------------   Medications: Outpatient Medications Prior to Visit  Medication Sig   albuterol (VENTOLIN HFA) 108 (90 Base) MCG/ACT inhaler Inhale 2 puffs into the lungs every 6 (six) hours as needed for shortness of breath.   amoxicillin-clavulanate (AUGMENTIN) 875-125 MG tablet Take 1 tablet by mouth 2 (two) times daily.   aspirin EC 81 MG tablet Take 1 tablet (81 mg total) by mouth daily. Swallow whole.   benzonatate (TESSALON) 100 MG capsule TAKE 2 CAPSULES (200 MG TOTAL) BY MOUTH 3 TIMES DAILY   brompheniramine-pseudoephedrine-DM 30-2-10 MG/5ML syrup Take 5 mLs by mouth 4 (four) times daily as needed.   Budeson-Glycopyrrol-Formoterol (BREZTRI AEROSPHERE) 160-9-4.8 MCG/ACT AERO Inhale 2 puffs into the lungs in the morning and at bedtime.   buPROPion (WELLBUTRIN) 75 MG tablet Take 75 mg by mouth 2 (two) times daily.   busPIRone (BUSPAR) 5 MG tablet Take 1 tablet (5 mg total) by mouth 2 (two) times daily.   cyclobenzaprine (FLEXERIL) 5 MG tablet TAKE 1 TABLET BY MOUTH 3 TIMES DAILY AS NEEDED FOR MUSCLE SPASMS   DULoxetine (CYMBALTA) 60 MG capsule Take  1 capsule (60 mg total) by mouth daily.   fluconazole (DIFLUCAN) 100 MG tablet Take 1 tablet (100 mg total) by mouth daily.   fluticasone (FLONASE) 50 MCG/ACT nasal spray Place 1 spray into both nostrils at bedtime.   gabapentin (NEURONTIN) 300 MG capsule Take 1 capsule (300 mg total) by mouth 3 (three) times daily.   HYDROcodone-acetaminophen (NORCO/VICODIN) 5-325 MG tablet Take 1 tablet by mouth every 4 (four) hours as needed for moderate pain.   montelukast (SINGULAIR) 10 MG tablet Take 1 tablet (10 mg total) by mouth at bedtime.   omeprazole (PRILOSEC) 40 MG capsule Take 40 mg by mouth in the morning and at bedtime.   ondansetron (ZOFRAN ODT) 4 MG disintegrating tablet Take 1 tablet (4 mg total) by mouth every 8 (eight) hours as needed.   ondansetron (ZOFRAN) 4 MG tablet Take 1 tablet (4 mg total) by mouth every 8 (eight) hours as needed for nausea or vomiting.   oxybutynin (DITROPAN XL) 15 MG 24 hr tablet Take 1 tablet (15 mg total) by mouth daily.   predniSONE (STERAPRED UNI-PAK 21 TAB) 10 MG (21) TBPK tablet Take as directed on package.   rOPINIRole (REQUIP) 0.25 MG tablet TAKE 1 TO 2 TABLETS BY MOUTH AT BEDTIME FOR RESTLESS LEGS   atorvastatin (LIPITOR) 40 MG tablet Take 1 tablet (40 mg total) by mouth daily.   Facility-Administered Medications Prior to Visit  Medication Dose Route Frequency Provider   cyanocobalamin ((VITAMIN B-12)) injection 1,000 mcg  1,000 mcg Intramuscular Q30 days Sindy Guadeloupe, MD    Review of  Systems     Objective    BP 117/64    Pulse 98    Temp 98 F (36.7 C) (Oral)    Resp 16    Wt 138 lb 6.4 oz (62.8 kg)    SpO2 99%    BMI 26.15 kg/m    Physical Exam Vitals and nursing note reviewed.  Constitutional:      General: She is not in acute distress.    Appearance: Normal appearance. She is well-groomed and overweight. She is not ill-appearing, toxic-appearing or diaphoretic.  HENT:     Head: Normocephalic and atraumatic.  Cardiovascular:     Rate  and Rhythm: Normal rate and regular rhythm.     Pulses: Normal pulses.     Heart sounds: Normal heart sounds. No murmur heard.   No friction rub. No gallop.  Pulmonary:     Effort: Pulmonary effort is normal. No respiratory distress.     Breath sounds: Normal breath sounds. No stridor. No wheezing, rhonchi or rales.  Chest:     Chest wall: No tenderness.  Abdominal:     General: Bowel sounds are normal.     Palpations: Abdomen is soft.  Musculoskeletal:        General: No swelling, tenderness, deformity or signs of injury. Normal range of motion.     Right lower leg: No edema.     Left lower leg: No edema.  Skin:    General: Skin is warm and dry.     Capillary Refill: Capillary refill takes less than 2 seconds.     Coloration: Skin is not jaundiced or pale.     Findings: No bruising, erythema, lesion or rash.  Neurological:     General: No focal deficit present.     Mental Status: She is alert and oriented to person, place, and time. Mental status is at baseline.     Cranial Nerves: No cranial nerve deficit.     Sensory: No sensory deficit.     Motor: No weakness.     Coordination: Coordination normal.  Psychiatric:        Mood and Affect: Mood normal.        Behavior: Behavior normal.        Thought Content: Thought content normal.        Judgment: Judgment normal.      No results found for any visits on 06/02/21.  Assessment & Plan     Problem List Items Addressed This Visit       Respiratory   Pulmonary emphysema ([place]) - Primary    Doing well since exacerbation SPO2 WDL Lungs clear Pt reports productive cough Doing well on medication Has not contacted pulm; encouraged to do so to f/u- not wearing O2 in office today; report of use at home when needed        Other   Chronic cough    Continue medications F/u with pulm- call to schedule appt      Stress and adjustment reaction    Both pt and her SO have been sick Referral placed to CCM Contracted to  safety; denies SI or HI      Relevant Orders   AMB Referral to [place]     No follow-ups on file.      Vonna Kotyk, FNP, have reviewed all documentation for this visit. The documentation on 06/02/21 for the exam, diagnosis, procedures, and orders are all accurate and complete.  Patient seen and examined by  Tally Joe,  FNP note scribed by [employer], [place], [place] [phone] (phone) [phone] (fax)  [place]

## 2021-06-02 NOTE — Assessment & Plan Note (Signed)
Doing well since exacerbation SPO2 WDL Lungs clear Pt reports productive cough Doing well on medication Has not contacted pulm; encouraged to do so to f/u- not wearing O2 in office today; report of use at home when needed

## 2021-06-02 NOTE — Assessment & Plan Note (Signed)
Both pt and her SO have been sick Referral placed to CCM Contracted to safety; denies SI or HI

## 2021-06-07 ENCOUNTER — Ambulatory Visit: Payer: Medicare HMO | Admitting: Family Medicine

## 2021-06-07 ENCOUNTER — Inpatient Hospital Stay: Payer: Medicare HMO | Attending: Oncology

## 2021-06-15 DIAGNOSIS — C44722 Squamous cell carcinoma of skin of right lower limb, including hip: Secondary | ICD-10-CM | POA: Diagnosis not present

## 2021-06-15 DIAGNOSIS — L570 Actinic keratosis: Secondary | ICD-10-CM | POA: Diagnosis not present

## 2021-06-15 DIAGNOSIS — D485 Neoplasm of uncertain behavior of skin: Secondary | ICD-10-CM | POA: Diagnosis not present

## 2021-06-15 DIAGNOSIS — C44622 Squamous cell carcinoma of skin of right upper limb, including shoulder: Secondary | ICD-10-CM | POA: Diagnosis not present

## 2021-06-17 ENCOUNTER — Telehealth: Payer: Self-pay | Admitting: *Deleted

## 2021-06-17 ENCOUNTER — Telehealth: Payer: Medicare HMO | Admitting: *Deleted

## 2021-06-17 DIAGNOSIS — C44722 Squamous cell carcinoma of skin of right lower limb, including hip: Secondary | ICD-10-CM | POA: Diagnosis not present

## 2021-06-17 DIAGNOSIS — L821 Other seborrheic keratosis: Secondary | ICD-10-CM | POA: Diagnosis not present

## 2021-06-17 DIAGNOSIS — D485 Neoplasm of uncertain behavior of skin: Secondary | ICD-10-CM | POA: Diagnosis not present

## 2021-06-17 DIAGNOSIS — D2262 Melanocytic nevi of left upper limb, including shoulder: Secondary | ICD-10-CM | POA: Diagnosis not present

## 2021-06-17 DIAGNOSIS — D2261 Melanocytic nevi of right upper limb, including shoulder: Secondary | ICD-10-CM | POA: Diagnosis not present

## 2021-06-17 DIAGNOSIS — L570 Actinic keratosis: Secondary | ICD-10-CM | POA: Diagnosis not present

## 2021-06-17 DIAGNOSIS — X32XXXA Exposure to sunlight, initial encounter: Secondary | ICD-10-CM | POA: Diagnosis not present

## 2021-06-17 DIAGNOSIS — D2272 Melanocytic nevi of left lower limb, including hip: Secondary | ICD-10-CM | POA: Diagnosis not present

## 2021-06-17 DIAGNOSIS — D0462 Carcinoma in situ of skin of left upper limb, including shoulder: Secondary | ICD-10-CM | POA: Diagnosis not present

## 2021-06-17 DIAGNOSIS — D225 Melanocytic nevi of trunk: Secondary | ICD-10-CM | POA: Diagnosis not present

## 2021-06-17 DIAGNOSIS — D2271 Melanocytic nevi of right lower limb, including hip: Secondary | ICD-10-CM | POA: Diagnosis not present

## 2021-06-17 NOTE — Telephone Encounter (Signed)
°  Care Management   Follow Up Note   06/17/2021 Name: Denise Watts MRN: [id number] DOB: 10-15-1955   Referred by: Jerrol Banana., MD Reason for referral : Care Coordination   An unsuccessful telephone outreach was attempted today. The patient was referred to the case management team for assistance with care management and care coordination.   Follow Up Plan: Telephone follow up appointment with care management team member to be re-scheduled by careguide:   Elliot Gurney, [place] Worker  [facility] [phone]

## 2021-06-18 ENCOUNTER — Ambulatory Visit: Payer: Medicare HMO | Admitting: Cardiology

## 2021-06-25 DIAGNOSIS — U071 COVID-19: Secondary | ICD-10-CM | POA: Diagnosis not present

## 2021-06-29 NOTE — Progress Notes (Signed)
?  ? ?I,Elena D DeSanto,acting as a scribe for Wilhemena Durie, MD.,have documented all relevant documentation on the behalf of Wilhemena Durie, MD,as directed by  Wilhemena Durie, MD while in the presence of Wilhemena Durie, MD. ? ? ?Established patient visit ? ? ?Patient: Denise Watts   DOB: [birth year]   66 y.o. Female  MRN: [id number] ?Visit Date: 06/30/2021 ? ?Today's healthcare provider: Wilhemena Durie, MD  ? ?No chief complaint on file. ? ?Subjective  ?  ?HPI  ?Today for follow-up.  Had right arm tingling for the past month or so. Marland Kitchen No  Pain  or weakness. ?She requests stronger narcotics for chronic back pain.  I told her I will be unable to give her any narcotics for chronic pain.  We will be happy to refer to pain center ?Medications: ?Outpatient Medications Prior to Visit  ?Medication Sig  ? albuterol (VENTOLIN HFA) 108 (90 Base) MCG/ACT inhaler Inhale 2 puffs into the lungs every 6 (six) hours as needed for shortness of breath.  ? aspirin EC 81 MG tablet Take 1 tablet (81 mg total) by mouth daily. Swallow whole.  ? benzonatate (TESSALON) 100 MG capsule TAKE 2 CAPSULES (200 MG TOTAL) BY MOUTH 3 TIMES DAILY  ? brompheniramine-pseudoephedrine-DM 30-2-10 MG/5ML syrup Take 5 mLs by mouth 4 (four) times daily as needed.  ? Budeson-Glycopyrrol-Formoterol (BREZTRI AEROSPHERE) 160-9-4.8 MCG/ACT AERO Inhale 2 puffs into the lungs in the morning and at bedtime.  ? buPROPion (WELLBUTRIN) 75 MG tablet Take 75 mg by mouth 2 (two) times daily.  ? busPIRone (BUSPAR) 5 MG tablet Take 1 tablet (5 mg total) by mouth 2 (two) times daily.  ? cyclobenzaprine (FLEXERIL) 5 MG tablet TAKE 1 TABLET BY MOUTH 3 TIMES DAILY AS NEEDED FOR MUSCLE SPASMS  ? DULoxetine (CYMBALTA) 60 MG capsule Take 1 capsule (60 mg total) by mouth daily.  ? fluconazole (DIFLUCAN) 100 MG tablet Take 1 tablet (100 mg total) by mouth daily.  ? fluticasone (FLONASE) 50 MCG/ACT nasal spray Place 1 spray into both nostrils at bedtime.  ?  gabapentin (NEURONTIN) 300 MG capsule Take 1 capsule (300 mg total) by mouth 3 (three) times daily.  ? HYDROcodone-acetaminophen (NORCO/VICODIN) 5-325 MG tablet Take 1 tablet by mouth every 4 (four) hours as needed for moderate pain.  ? montelukast (SINGULAIR) 10 MG tablet Take 1 tablet (10 mg total) by mouth at bedtime.  ? omeprazole (PRILOSEC) 40 MG capsule Take 40 mg by mouth in the morning and at bedtime.  ? ondansetron (ZOFRAN ODT) 4 MG disintegrating tablet Take 1 tablet (4 mg total) by mouth every 8 (eight) hours as needed.  ? ondansetron (ZOFRAN) 4 MG tablet Take 1 tablet (4 mg total) by mouth every 8 (eight) hours as needed for nausea or vomiting.  ? oxybutynin (DITROPAN XL) 15 MG 24 hr tablet Take 1 tablet (15 mg total) by mouth daily.  ? rOPINIRole (REQUIP) 0.25 MG tablet TAKE 1 TO 2 TABLETS BY MOUTH AT BEDTIME FOR RESTLESS LEGS  ? amoxicillin-clavulanate (AUGMENTIN) 875-125 MG tablet Take 1 tablet by mouth 2 (two) times daily.  ? atorvastatin (LIPITOR) 40 MG tablet Take 1 tablet (40 mg total) by mouth daily.  ? predniSONE (STERAPRED UNI-PAK 21 TAB) 10 MG (21) TBPK tablet Take as directed on package.  ? ?Facility-Administered Medications Prior to Visit  ?Medication Dose Route Frequency Provider  ? cyanocobalamin ((VITAMIN B-12)) injection 1,000 mcg  1,000 mcg Intramuscular Q30 days Sindy Guadeloupe, MD  ? ? ?  Review of Systems  ?Respiratory:  Positive for cough.   ? ?Last CBC ?Lab Results  ?Component Value Date  ? WBC 4.6 05/26/2021  ? HGB 12.9 05/26/2021  ? HCT 40.6 05/26/2021  ? MCV 95 05/26/2021  ? MCH 30.2 05/26/2021  ? RDW 11.8 05/26/2021  ? PLT 250 05/26/2021  ? ?  ?  Objective  ?  ?BP 119/70 (BP Location: Right Arm, Patient Position: Sitting, Cuff Size: Normal)   Pulse 99   Temp 97.6 ?F (36.4 ?C) (Oral)   Ht 5\' 1"  (1.549 m)   Wt 136 lb 11.2 oz (62 kg)   SpO2 95%   BMI 25.83 kg/m?  ?BP Readings from Last 3 Encounters:  ?06/30/21 119/70  ?06/02/21 117/64  ?05/26/21 105/66  ? ?Wt Readings from Last  3 Encounters:  ?06/30/21 136 lb 11.2 oz (62 kg)  ?06/02/21 138 lb 6.4 oz (62.8 kg)  ?05/26/21 140 lb 11.2 oz (63.8 kg)  ? ?  ? ?Physical Exam ?Vitals reviewed.  ?Constitutional:   ?   General: She is not in acute distress. ?   Appearance: She is well-developed.  ?HENT:  ?   Head: Normocephalic and atraumatic.  ?   Right Ear: Hearing normal.  ?   Left Ear: Hearing normal.  ?   Nose: Nose normal.  ?Eyes:  ?   General: Lids are normal. No scleral icterus.    ?   Right eye: No discharge.     ?   Left eye: No discharge.  ?   Conjunctiva/sclera: Conjunctivae normal.  ?Cardiovascular:  ?   Rate and Rhythm: Normal rate and regular rhythm.  ?   Heart sounds: Normal heart sounds.  ?Pulmonary:  ?   Effort: Pulmonary effort is normal. No respiratory distress.  ?Skin: ?   Findings: No lesion or rash.  ?Neurological:  ?   General: No focal deficit present.  ?   Mental Status: She is alert and oriented to person, place, and time.  ?   Comments: Negative Tinel sign.  Strength in  upper extremities is normal.  ?Psychiatric:     ?   Mood and Affect: Mood normal.     ?   Speech: Speech normal.     ?   Behavior: Behavior normal.     ?   Thought Content: Thought content normal.     ?   Judgment: Judgment normal.  ?  ? ? ?No results found for any visits on 06/30/21. ? Assessment & Plan  ?  ? ?1. Cervical myelopathy ([place]) ?Prednisone for a few days.  Neurology evaluation which has been referred in the past. ?- predniSONE (DELTASONE) 20 MG tablet; Take 1 tablet (20 mg total) by mouth daily with breakfast.  Dispense: 7 tablet; Refill: 0 ? ?2. Pulmonary emphysema, unspecified emphysema type ([place]) ?Clinically stable today. ? ?3. 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 ? ?4. Gastroesophageal reflux disease, unspecified whether esophagitis present ? ? ?5. Multiple lung nodules on CT ? ? ?6. Malignant neoplasm of lower-outer quadrant of right breast of female, estrogen receptor negative ([place]) ? ? ?7. History of throat cancer ?Patient just had a barium  swallow ? ?8. Chronic pain syndrome ?Referral to pain center ? ?9. Chronic bilateral low back pain without sciatica ? ? ?10. Anxiety and depression ?Continue Cymbalta. ? ?11. B12 deficiency ? ? ? ?No follow-ups on file.  ?   ? ?I, Wilhemena Durie, MD, have reviewed all documentation for this visit. The documentation  on 07/02/21 for the exam, diagnosis, procedures, and orders are all accurate and complete. ? ? ? ?Karan Inclan Cranford Mon, MD  ?[facility] ?[phone] (phone) ?[phone] (fax) ? ?[facility] ?

## 2021-06-30 ENCOUNTER — Ambulatory Visit (INDEPENDENT_AMBULATORY_CARE_PROVIDER_SITE_OTHER): Payer: Medicare HMO | Admitting: Family Medicine

## 2021-06-30 ENCOUNTER — Encounter: Payer: Self-pay | Admitting: Family Medicine

## 2021-06-30 ENCOUNTER — Other Ambulatory Visit: Payer: Self-pay

## 2021-06-30 VITALS — BP 119/70 | HR 99 | Temp 97.6°F | Ht 61.0 in | Wt 136.7 lb

## 2021-06-30 DIAGNOSIS — C320 Malignant neoplasm of glottis: Secondary | ICD-10-CM | POA: Diagnosis not present

## 2021-06-30 DIAGNOSIS — F419 Anxiety disorder, unspecified: Secondary | ICD-10-CM

## 2021-06-30 DIAGNOSIS — G8929 Other chronic pain: Secondary | ICD-10-CM

## 2021-06-30 DIAGNOSIS — C50511 Malignant neoplasm of lower-outer quadrant of right female breast: Secondary | ICD-10-CM

## 2021-06-30 DIAGNOSIS — G894 Chronic pain syndrome: Secondary | ICD-10-CM | POA: Diagnosis not present

## 2021-06-30 DIAGNOSIS — J439 Emphysema, unspecified: Secondary | ICD-10-CM

## 2021-06-30 DIAGNOSIS — M545 Low back pain, unspecified: Secondary | ICD-10-CM

## 2021-06-30 DIAGNOSIS — G959 Disease of spinal cord, unspecified: Secondary | ICD-10-CM | POA: Diagnosis not present

## 2021-06-30 DIAGNOSIS — K219 Gastro-esophageal reflux disease without esophagitis: Secondary | ICD-10-CM

## 2021-06-30 DIAGNOSIS — E538 Deficiency of other specified B group vitamins: Secondary | ICD-10-CM

## 2021-06-30 DIAGNOSIS — Z171 Estrogen receptor negative status [ER-]: Secondary | ICD-10-CM

## 2021-06-30 DIAGNOSIS — R918 Other nonspecific abnormal finding of lung field: Secondary | ICD-10-CM

## 2021-06-30 DIAGNOSIS — Z85819 Personal history of malignant neoplasm of unspecified site of lip, oral cavity, and pharynx: Secondary | ICD-10-CM

## 2021-06-30 DIAGNOSIS — F32A Depression, unspecified: Secondary | ICD-10-CM

## 2021-06-30 MED ORDER — PREDNISONE 20 MG PO TABS: 20.0000 mg | ORAL_TABLET | Freq: Every day | ORAL | 0 refills | Status: DC

## 2021-07-05 ENCOUNTER — Other Ambulatory Visit: Payer: Self-pay

## 2021-07-05 ENCOUNTER — Inpatient Hospital Stay: Payer: Medicare HMO | Attending: Oncology

## 2021-07-05 DIAGNOSIS — E538 Deficiency of other specified B group vitamins: Secondary | ICD-10-CM | POA: Diagnosis not present

## 2021-07-05 DIAGNOSIS — Z23 Encounter for immunization: Secondary | ICD-10-CM

## 2021-07-05 MED ORDER — CYANOCOBALAMIN 1000 MCG/ML IJ SOLN
1000.0000 ug | INTRAMUSCULAR | Status: DC
  Administered 2021-07-05: 1000 ug via INTRAMUSCULAR
  Filled 2021-07-05: qty 1

## 2021-07-05 NOTE — Progress Notes (Signed)
07/06/2021 2:50 PM   Denise Watts 12-28-1955 [id number]  Referring provider: Jerrol Banana., MD [address] [place],  [place] [address]  Chief Complaint  Patient presents with   Over Active Bladder   Urological history: 1. OAB wet -contributing factors of age, smoking, vaginal atrophy, COPD, anxiety, depression, parkinsonism, pre-diabetic and muscle relaxer's -cysto 2022- NED -PVR 0 mL -failed Gemtesa 75 mg daily -Managed with oxybutynin XL 15 mg daily  2. Stress incontinence -contributing factors of age and vaginal atrophy  3. Right adrenal mass -s/p right adrenalectomy 2018 - pathology positive for high grade carcinoma with extensive necrosis - surgical margins negative  -followed by the Cancer center  HPI: Denise Watts is a 66 y.o. female who presents today for a three month follow up.  She was last seen in December with Thomas Hoff, PA-C for her and incontinence issues.  She was offered a referral to pelvic floor physical therapy versus a consultation with Dr. Matilde Sprang which she deferred.  At that time, she stated she was undergoing further evaluation and management regarding her spinal issues.  As of this time, she has not had any further evaluation concerning her spinal issues. (Refer to lumbar MRI dated December 29, 2020)  She is having 1-7 daytime urinations, nocturia x1-2 and a strong urge to urinate.  She endorses both urge and stress incontinence.  She states she is leaking 3 or more times daily and wears absorbent pads on a daily basis up to 3 times a day.  She is not limiting her fluid intake in efforts to stave off urination as she does not engage in toilet mapping.  She continues to take the oxybutynin XL 10 mg daily and states it made no difference.  Her bladder scan today notes PVR 0 mL.    She is also concerned about her dysuria which she describes as recurrent urinary tract infections.  She states the dysuria comes and goes along  with suprapubic pain.   She has not noted a pattern with this dysuria.  Patient denies any modifying or aggravating factors.  Patient denies any gross hematuria or suprapubic/flank pain.  Patient denies any fevers, chills, nausea or vomiting.       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   Cough 03/02/2017   INTERMTTENT DRY COUGH   Depression    GERD (gastroesophageal reflux disease)    History of hiatal hernia    Personal history of chemotherapy    Personal history of radiation therapy    Skin cancer    Throat cancer ([place]) 1998   radiation    Surgical History: Past Surgical History:  Procedure Laterality Date   BREAST BIOPSY Right 1997   positive   BREAST BIOPSY Right 11/15/2017   INVASIVE MAMMARY CARCINOMA triple negative   BREAST BIOPSY Right 07/20/2020   [place] bx, Q marker, neg   BREAST LUMPECTOMY Right 1997   2019 also   BREAST LUMPECTOMY WITH SENTINEL LYMPH NODE BIOPSY Right 12/08/2017   Procedure: BREAST LUMPECTOMY WITH SENTINEL LYMPH NODE BX;  Surgeon: Robert Bellow, MD;  Location: [facility];  Service: General;  Laterality: Right;   BREAST REDUCTION WITH MASTOPEXY Left 06/10/2019  Procedure: Left breast mastopexy reduction for symmetry;  Surgeon: Wallace Going, DO;  Location: [facility];  Service: Plastics;  Laterality: Left;  total 2.5 hours   COLONOSCOPY  2015   COLONOSCOPY WITH PROPOFOL N/A 10/29/2019   Procedure: COLONOSCOPY WITH PROPOFOL;  Surgeon: Lucilla Lame, MD;  Location: [facility] END[facility];  Service: Endoscopy;  Laterality: N/A;   ESOPHAGOGASTRODUODENOSCOPY (EGD) WITH PROPOFOL N/A 05/24/2019   Procedure: ESOPHAGOGASTRODUODENOSCOPY (EGD) WITH PROPOFOL;  Surgeon: Lucilla Lame, MD;  Location: [facility] ENDOSCOPY;   Service: Endoscopy;  Laterality: N/A;   ESOPHAGUS SURGERY     KNEE SURGERY Right    LIPOSUCTION WITH LIPOFILLING Right 06/10/2019   Procedure: right breast scar and capsule release with fat grafting;  Surgeon: Wallace Going, DO;  Location: [facility];  Service: Plastics;  Laterality: Right;   PORT-A-CATH REMOVAL     PORTACATH PLACEMENT Right 01/17/2018   Procedure: INSERTION PORT-A-CATH- RIGHT;  Surgeon: Robert Bellow, MD;  Location: [facility];  Service: General;  Laterality: Right;   ROBOTIC ADRENALECTOMY Right 12/28/2016   Procedure: ROBOTIC ADRENALECTOMY;  Surgeon: Hollice Espy, MD;  Location: [facility];  Service: Urology;  Laterality: Right;   SKIN CANCER EXCISION     THROAT SURGERY  1998   throat cancer    TUBAL LIGATION     VENTRAL HERNIA REPAIR N/A 03/03/2017   Procedure: HERNIA REPAIR VENTRAL ADULT;  Surgeon: [place] Pert, MD;  Location: [facility];  Service: General;  Laterality: N/A;    Home Medications:  Allergies as of 07/06/2021       Reactions   Sulfa Antibiotics Other (See Comments)   Mouth sore and raw        Medication List        Accurate as of July 06, 2021 11:59 PM. If you have any questions, ask your nurse or doctor.          albuterol 108 (90 Base) MCG/ACT inhaler Commonly known as: VENTOLIN HFA Inhale 2 puffs into the lungs every 6 (six) hours as needed for shortness of breath.   amoxicillin-clavulanate 875-125 MG tablet Commonly known as: AUGMENTIN Take 1 tablet by mouth 2 (two) times daily.   aspirin EC 81 MG tablet Take 1 tablet (81 mg total) by mouth daily. Swallow whole.   atorvastatin 40 MG tablet Commonly known as: LIPITOR Take 1 tablet (40 mg total) by mouth daily.   benzonatate 100 MG capsule Commonly known as: TESSALON TAKE 2 CAPSULES (200 MG TOTAL) BY MOUTH 3 TIMES DAILY   Breztri Aerosphere 160-9-4.8 MCG/ACT Aero Generic drug: Budeson-Glycopyrrol-Formoterol Inhale 2 puffs into the lungs in the  morning and at bedtime.   brompheniramine-pseudoephedrine-DM 30-2-10 MG/5ML syrup Take 5 mLs by mouth 4 (four) times daily as needed.   buPROPion 75 MG tablet Commonly known as: WELLBUTRIN Take 75 mg by mouth 2 (two) times daily.   busPIRone 5 MG tablet Commonly known as: BUSPAR Take 1 tablet (5 mg total) by mouth 2 (two) times daily.   cyclobenzaprine 5 MG tablet Commonly known as: FLEXERIL TAKE 1 TABLET BY MOUTH 3 TIMES DAILY AS NEEDED FOR MUSCLE SPASMS   DULoxetine 60 MG capsule Commonly known as: CYMBALTA Take 1 capsule (60 mg total) by mouth daily.   fluconazole 100 MG tablet Commonly known as: DIFLUCAN Take 1 tablet (100 mg total) by mouth daily.   fluticasone 50 MCG/ACT nasal spray Commonly known as: FLONASE Place 1 spray into both nostrils at bedtime.   gabapentin 300 MG capsule Commonly  known as: NEURONTIN Take 1 capsule (300 mg total) by mouth 3 (three) times daily.   HYDROcodone-acetaminophen 5-325 MG tablet Commonly known as: NORCO/VICODIN Take 1 tablet by mouth every 4 (four) hours as needed for moderate pain.   montelukast 10 MG tablet Commonly known as: SINGULAIR Take 1 tablet (10 mg total) by mouth at bedtime.   omeprazole 40 MG capsule Commonly known as: PRILOSEC Take 40 mg by mouth in the morning and at bedtime.   ondansetron 4 MG disintegrating tablet Commonly known as: Zofran ODT Take 1 tablet (4 mg total) by mouth every 8 (eight) hours as needed.   ondansetron 4 MG tablet Commonly known as: Zofran Take 1 tablet (4 mg total) by mouth every 8 (eight) hours as needed for nausea or vomiting.   oxybutynin 15 MG 24 hr tablet Commonly known as: DITROPAN XL Take 1 tablet (15 mg total) by mouth daily.   predniSONE 10 MG (21) Tbpk tablet Commonly known as: STERAPRED UNI-PAK 21 TAB Take as directed on package.   predniSONE 20 MG tablet Commonly known as: DELTASONE Take 1 tablet (20 mg total) by mouth daily with breakfast.   rOPINIRole 0.25 MG  tablet Commonly known as: REQUIP TAKE 1 TO 2 TABLETS BY MOUTH AT BEDTIME FOR RESTLESS LEGS        Allergies:  Allergies  Allergen Reactions   Sulfa Antibiotics Other (See Comments)    Mouth sore and raw    Family History: Family History  Problem Relation Age of Onset   Diabetes Mother    Heart attack Mother    Cancer Sister        cancer of eyes, spread to lung, other places. died at 54   Cancer Sister 62       unknown primary, spread to bone, brain died in 54's   Cancer Paternal Aunt        unk type   Cancer Paternal Grandmother        type unk   Prostate cancer Neg Hx    Kidney cancer Neg Hx    Bladder Cancer Neg Hx     Social History:  reports that she quit smoking about 26 years ago. Her smoking use included cigarettes. She has a 45.00 pack-year smoking history. She has never used smokeless tobacco. She reports current alcohol use of about 4.0 standard drinks per week. She reports that she does not use drugs.  ROS: Pertinent ROS in HPI  Physical Exam: BP 121/81    Pulse 99    Ht '5\' 1"'$  (1.549 m)    Wt 135 lb (61.2 kg)    BMI 25.51 kg/m   Constitutional:  Well nourished. Alert and oriented, No acute distress. HEENT: [place] AT, mask in place.  Trachea midline. Cardiovascular: No clubbing, cyanosis, or edema. Respiratory: Normal respiratory effort, no increased work of breathing. Neurologic: Grossly intact, no focal deficits, moving all 4 extremities. Psychiatric: Normal Watts and affect.    Laboratory Data: Lab Results  Component Value Date   WBC 4.6 05/26/2021   HGB 12.9 05/26/2021   HCT 40.6 05/26/2021   MCV 95 05/26/2021   PLT 250 05/26/2021    Lab Results  Component Value Date   CREATININE 0.76 05/26/2021    Lab Results  Component Value Date   HGBA1C 5.6 05/26/2021        Component Value Date/Time   CHOL 135 05/26/2021 1101   HDL 46 05/26/2021 1101   CHOLHDL 2.9 05/26/2021 1101   LDLCALC 68  05/26/2021 1101    Lab Results  Component Value  Date   AST 21 05/26/2021   Lab Results  Component Value Date   ALT 12 05/26/2021    Urinalysis    Component Value Date/Time   COLORURINE AMBER (A) 01/31/2021 2341   APPEARANCEUR Clear 04/06/2021 1409   LABSPEC >1.046 (H) 01/31/2021 2341   PHURINE 5.0 01/31/2021 2341   GLUCOSEU Negative 04/06/2021 1409   HGBUR NEGATIVE 01/31/2021 2341   BILIRUBINUR Negative 04/06/2021 [place] 01/31/2021 2341   PROTEINUR Negative 04/06/2021 1409   PROTEINUR NEGATIVE 01/31/2021 2341   UROBILINOGEN 0.2 10/15/2020 1015   NITRITE Negative 04/06/2021 1409   NITRITE POSITIVE (A) 01/31/2021 2341   LEUKOCYTESUR Negative 04/06/2021 1409   LEUKOCYTESUR NEGATIVE 01/31/2021 2341  I have reviewed the labs.   Pertinent Imaging:  07/06/21 13:51  Scan Result 40m    Assessment & Plan:    1. OAB wet -She is not interested in other management options of the treatment of her OAB wet, so she will continue the oxybutynin for XL 15 mg daily  2. Dysuria -Explained to the patient that dysuria caused by UTI would not come and go, but would persist until the urinary tract was treated -She has undergone a cystoscopic evaluation and urinalysis have been clear when she was having her symptoms of dysuria and she does not have a documented history of recurrent UTIs -It may be possible that she is suffering from interstitial cystitis and it offer her a trial of rescue solutions to see if this would ease her bladder pain/dysuria  Return for Next week for rescue solutions.  These notes generated with voice recognition software. I apologize for typographical errors.  SZara Council PA-C  [place] [place] [phone]  I spent 25 minutes on the day of the encounter to include pre-visit record review, face-to-face time with the patient, and post-visit ordering of tests.

## 2021-07-06 ENCOUNTER — Encounter: Payer: Self-pay | Admitting: Urology

## 2021-07-06 ENCOUNTER — Ambulatory Visit: Payer: Medicare HMO | Admitting: Urology

## 2021-07-06 VITALS — BP 121/81 | HR 99 | Ht 61.0 in | Wt 135.0 lb

## 2021-07-06 DIAGNOSIS — N3281 Overactive bladder: Secondary | ICD-10-CM

## 2021-07-06 DIAGNOSIS — R300 Dysuria: Secondary | ICD-10-CM

## 2021-07-06 LAB — BLADDER SCAN AMB NON-IMAGING

## 2021-07-08 ENCOUNTER — Telehealth: Payer: Medicare HMO

## 2021-07-08 ENCOUNTER — Telehealth: Payer: Self-pay | Admitting: *Deleted

## 2021-07-08 NOTE — Telephone Encounter (Signed)
?  Care Management  ? ?Follow Up Note ? ? ?07/08/2021 ?Name: Denise Watts MRN: [id number] DOB: 01/11/1956 ? ? ?Referred by: [name], FNP ?Reason for referral : Care Coordination ? ? ?An unsuccessful telephone outreach was attempted today. The patient was referred to the case management team for assistance with care management and care coordination.  ? ?Follow Up Plan: Telephone follow up appointment with care management team member to be re-scheduled by Careguide ? ? ?Graclyn Lawther, LCSW ?Clinical Social Worker  ?[facility] ?[phone]  ? ?

## 2021-07-12 ENCOUNTER — Ambulatory Visit (INDEPENDENT_AMBULATORY_CARE_PROVIDER_SITE_OTHER): Payer: Medicare HMO | Admitting: Family Medicine

## 2021-07-12 ENCOUNTER — Other Ambulatory Visit: Payer: Self-pay

## 2021-07-12 DIAGNOSIS — N3281 Overactive bladder: Secondary | ICD-10-CM | POA: Diagnosis not present

## 2021-07-12 LAB — URINALYSIS, COMPLETE
Bilirubin, UA: NEGATIVE
Glucose, UA: NEGATIVE
Ketones, UA: NEGATIVE
Leukocytes,UA: NEGATIVE
Nitrite, UA: NEGATIVE
Protein,UA: NEGATIVE
RBC, UA: NEGATIVE
Specific Gravity, UA: 1.015 (ref 1.005–1.030)
Urobilinogen, Ur: 0.2 mg/dL (ref 0.2–1.0)
pH, UA: 7 (ref 5.0–7.5)

## 2021-07-12 LAB — MICROSCOPIC EXAMINATION
Bacteria, UA: NONE SEEN
RBC, Urine: NONE SEEN /hpf (ref 0–2)

## 2021-07-12 MED ORDER — SODIUM BICARBONATE 8.4 % IV SOLN
11.0000 mL | Freq: Once | INTRAVENOUS | Status: AC
  Administered 2021-07-12: 11 mL

## 2021-07-12 NOTE — Progress Notes (Signed)
Bladder Instillation ? ?Due to Overactive bladder patient is present today for a Bladder Instillation of Heparin/sodium Bicarbonate/Lidocaine. Patient was cleaned and prepped in a sterile fashion with betadine and lidocaine 2% jelly was instilled into the urethra.  A 14FR catheter was inserted, urine return was noted 118m, urine was yellow in color.  11 ml was instilled into the bladder. The catheter was then removed. Patient tolerated well, no complications were noted ?Patient held in bladder for 30 minutes prior to procedure starting.  ? ?Performed by: CElberta Leatherwood CMA ? ?Follow up/ Additional notes: Wednesday  ?

## 2021-07-14 ENCOUNTER — Ambulatory Visit: Payer: Medicare HMO | Admitting: Urology

## 2021-07-15 ENCOUNTER — Telehealth: Payer: Self-pay | Admitting: *Deleted

## 2021-07-15 NOTE — Progress Notes (Incomplete)
Bladder Rescue Solution Instillation ? ?Due to *** patient is present today for a Rescue Solution Treatment.  Patient was cleaned and prepped in a sterile fashion with betadine and lidocaine 2% jelly was instilled into the urethra.  A *** FR catheter was inserted, urine return was noted ***ml, urine was *** in color.  Instilled a solution consisting of 19m of Sodium Bicarb, 2 ml Lidocaine and 1 ml of Heparin. The catheter was then removed. Patient tolerated well, {dnt complications:20057}.  ? ?Performed by: *** ? ?Follow up/ Additional Notes: *** ? ?

## 2021-07-15 NOTE — Chronic Care Management (AMB) (Signed)
?  Care Management  ? ?Note ? ?07/15/2021 ?Name: Denise Watts MRN: [id number] DOB: 25-May-1955 ? ?Denise Watts is a 66 y.o. year old female who is a primary care patient of [name], FNP and is actively engaged with the care management team. I reached out to [name] by phone today to assist with re-scheduling an initial visit with the Licensed Clinical Social Worker ? ?Follow up plan: ?Unsuccessful telephone outreach attempt made. A HIPAA compliant phone message was left for the patient providing contact information and requesting a return call.  ? ?Rubina Basinski, CCMA ?Care Guide, Embedded Care Coordination ?[facility]  Care Management  ?Direct Dial: [phone] ? ? ?

## 2021-07-16 ENCOUNTER — Ambulatory Visit: Payer: Medicare HMO | Admitting: Urology

## 2021-07-16 ENCOUNTER — Encounter: Payer: Self-pay | Admitting: Family Medicine

## 2021-07-16 ENCOUNTER — Ambulatory Visit: Payer: Medicare HMO | Admitting: Cardiology

## 2021-07-19 ENCOUNTER — Encounter: Payer: Self-pay | Admitting: Cardiology

## 2021-07-19 ENCOUNTER — Ambulatory Visit: Payer: Medicare HMO

## 2021-07-19 NOTE — Chronic Care Management (AMB) (Signed)
?  Care Management  ? ?Note ? ?07/19/2021 ?Name: Denise Watts MRN: [id number] DOB: 1955-08-25 ? ?Denise Watts is a 66 y.o. year old female who is a primary care patient of [name], FNP and is actively engaged with the care management team. I reached out to [name] by phone today to assist with re-scheduling an initial visit with the Licensed Clinical Social Worker ? ?Follow up plan: ?2nd Unsuccessful telephone outreach attempt made. A HIPAA compliant phone message was left for the patient providing contact information and requesting a return call.  ? ?Jaide Hillenburg, CCMA ?Care Guide, Embedded Care Coordination ?[facility]  Care Management  ?Direct Dial: [phone] ? ? ?

## 2021-07-21 ENCOUNTER — Ambulatory Visit: Payer: Medicare HMO | Admitting: Urology

## 2021-07-21 NOTE — Chronic Care Management (AMB) (Signed)
?  Care Management  ? ?Note ? ?07/21/2021 ?Name: Denise Watts MRN: [id number] DOB: 12-10-1955 ? ?Denise Watts is a 66 y.o. year old female who is a primary care patient of [name], FNP and is actively engaged with the care management team. I reached out to [name] by phone today to assist with re-scheduling an initial visit with the Licensed Clinical Social Worker ? ?Follow up plan: ?Telephone appointment with care management team member scheduled for: 07/27/2021 ? ?Aisia Correira, CCMA ?Care Guide, Embedded Care Coordination ?[place]  Care Management  ?Direct Dial: [phone] ? ? ?

## 2021-07-23 ENCOUNTER — Ambulatory Visit: Payer: Medicare HMO

## 2021-07-23 DIAGNOSIS — U071 COVID-19: Secondary | ICD-10-CM | POA: Diagnosis not present

## 2021-07-27 ENCOUNTER — Other Ambulatory Visit: Payer: Self-pay | Admitting: Primary Care

## 2021-07-27 ENCOUNTER — Telehealth: Payer: Medicare HMO

## 2021-07-27 ENCOUNTER — Telehealth: Payer: Self-pay | Admitting: *Deleted

## 2021-07-27 DIAGNOSIS — R053 Chronic cough: Secondary | ICD-10-CM

## 2021-07-27 DIAGNOSIS — J432 Centrilobular emphysema: Secondary | ICD-10-CM

## 2021-07-27 NOTE — Telephone Encounter (Signed)
?  Care Management  ? ?Follow Up Note ? ? ?07/27/2021 ?Name: Denise Watts MRN: [id number] DOB: 01/13/1956 ? ? ?Referred by: [name], FNP ?Reason for referral : Care Coordination ? ? ?A second unsuccessful telephone outreach was attempted today. The patient was referred to the case management team for assistance with care management and care coordination.  ? ?Follow Up Plan: Telephone follow up appointment with care management team member to be re-scheduled by careguide. ? ? ?Aarilyn Dye, LCSW ?Clinical Social Worker  ?[facility] ?[phone] ? ? ?

## 2021-07-28 NOTE — Telephone Encounter (Signed)
Beth, please advise if you are okay with [place] refilling Rx. ?

## 2021-07-28 NOTE — Telephone Encounter (Signed)
Yes that is fine, ill send in

## 2021-08-03 ENCOUNTER — Other Ambulatory Visit: Payer: Self-pay | Admitting: Family Medicine

## 2021-08-03 DIAGNOSIS — G2581 Restless legs syndrome: Secondary | ICD-10-CM

## 2021-08-16 ENCOUNTER — Other Ambulatory Visit: Payer: Self-pay | Admitting: Family Medicine

## 2021-08-17 ENCOUNTER — Ambulatory Visit (INDEPENDENT_AMBULATORY_CARE_PROVIDER_SITE_OTHER): Payer: Medicare HMO | Admitting: *Deleted

## 2021-08-17 DIAGNOSIS — F419 Anxiety disorder, unspecified: Secondary | ICD-10-CM

## 2021-08-17 DIAGNOSIS — G894 Chronic pain syndrome: Secondary | ICD-10-CM

## 2021-08-17 DIAGNOSIS — Z85819 Personal history of malignant neoplasm of unspecified site of lip, oral cavity, and pharynx: Secondary | ICD-10-CM

## 2021-08-17 NOTE — Chronic Care Management (AMB) (Signed)
Care Management ?Clinical Social Work Note ? ?08/17/2021 ?Name: Denise Watts MRN: [id number] DOB: 25-May-1955 ? ?Denise Watts is a 66 y.o. year old female who is a primary care patient of [name], FNP.  The Care Management team was consulted for assistance with chronic disease management and coordination needs. ? ?Engaged with patient by telephone for initial visit in response to provider referral for social work chronic care management and care coordination services ? ?Consent to Services:  ?Denise Watts was given information about Care Management services today including:  ?Care Management services includes personalized support from designated clinical staff supervised by her physician, including individualized plan of care and coordination with other care providers ?24/7 contact phone numbers for assistance for urgent and routine care needs. ?The patient may stop case management services at any time by phone call to the office staff. ? ?Patient agreed to services and consent obtained.  ? ?Assessment: Review of patient past medical history, allergies, medications, and health status, including review of relevant consultants reports was performed today as part of a comprehensive evaluation and provision of chronic care management and care coordination services. ? ?SDOH (Social Determinants of Health) assessments and interventions performed:  ?SDOH Interventions   ? ?Flowsheet Row Most Recent Value  ?SDOH Interventions   ?Stress Interventions Provide Counseling  ? ?  ?  ? ?Advanced Directives Status: Not addressed in this encounter. ? ?Care Plan ? ?Allergies  ?Allergen Reactions  ? Sulfa Antibiotics Other (See Comments)  ?  Mouth sore and raw  ? ? ?Outpatient Encounter Medications as of 08/17/2021  ?Medication Sig Note  ? albuterol (VENTOLIN HFA) 108 (90 Base) MCG/ACT inhaler Inhale 2 puffs into the lungs every 6 (six) hours as needed for shortness of breath.   ? aspirin EC 81 MG tablet Take 1 tablet (81 mg total) by  mouth daily. Swallow whole.   ? atorvastatin (LIPITOR) 40 MG tablet Take 1 tablet (40 mg total) by mouth daily.   ? benzonatate (TESSALON) 100 MG capsule TAKE 2 CAPSULES (200 MG TOTAL) BY MOUTH 3 TIMES DAILY   ? brompheniramine-pseudoephedrine-DM 30-2-10 MG/5ML syrup Take 5 mLs by mouth 4 (four) times daily as needed.   ? Budeson-Glycopyrrol-Formoterol (BREZTRI AEROSPHERE) 160-9-4.8 MCG/ACT AERO Inhale 2 puffs into the lungs in the morning and at bedtime.   ? buPROPion (WELLBUTRIN) 75 MG tablet Take 75 mg by mouth 2 (two) times daily.   ? busPIRone (BUSPAR) 5 MG tablet Take 1 tablet (5 mg total) by mouth 2 (two) times daily.   ? cyclobenzaprine (FLEXERIL) 5 MG tablet TAKE 1 TABLET BY MOUTH 3 TIMES DAILY AS NEEDED FOR MUSCLE SPASMS   ? DULoxetine (CYMBALTA) 60 MG capsule Take 1 capsule (60 mg total) by mouth daily.   ? fluconazole (DIFLUCAN) 100 MG tablet Take 1 tablet (100 mg total) by mouth daily.   ? fluticasone (FLONASE) 50 MCG/ACT nasal spray Place 1 spray into both nostrils at bedtime.   ? gabapentin (NEURONTIN) 300 MG capsule Take 1 capsule (300 mg total) by mouth 3 (three) times daily.   ? HYDROcodone-acetaminophen (NORCO/VICODIN) 5-325 MG tablet Take 1 tablet by mouth every 4 (four) hours as needed for moderate pain. 02/01/2021: Pt has but not needed  ? montelukast (SINGULAIR) 10 MG tablet Take 1 tablet (10 mg total) by mouth at bedtime.   ? omeprazole (PRILOSEC) 40 MG capsule Take 40 mg by mouth in the morning and at bedtime.   ? ondansetron (ZOFRAN ODT) 4 MG disintegrating tablet  Take 1 tablet (4 mg total) by mouth every 8 (eight) hours as needed.   ? ondansetron (ZOFRAN) 4 MG tablet Take 1 tablet (4 mg total) by mouth every 8 (eight) hours as needed for nausea or vomiting.   ? oxybutynin (DITROPAN XL) 15 MG 24 hr tablet Take 1 tablet (15 mg total) by mouth daily.   ? predniSONE (DELTASONE) 20 MG tablet Take 1 tablet (20 mg total) by mouth daily with breakfast.   ? rOPINIRole (REQUIP) 0.25 MG tablet TAKE 1  TO 2 TABLETS BY MOUTH AT BEDTIME FOR RESTLESS LEGS.   ? ?Facility-Administered Encounter Medications as of 08/17/2021  ?Medication  ? cyanocobalamin ((VITAMIN B-12)) injection 1,000 mcg  ? ? ?Patient Active Problem List  ? Diagnosis Date Noted  ? Stress and adjustment reaction 06/02/2021  ? Acute bronchitis with COPD ([place]) 05/26/2021  ? Pulmonary emphysema ([place]) 05/26/2021  ? Need for influenza vaccination 05/26/2021  ? Need for prophylactic vaccination against Streptococcus pneumoniae (pneumococcus) 05/26/2021  ? Prediabetes 05/26/2021  ? Mixed hyperlipidemia 05/26/2021  ? Right ear pain 05/26/2021  ? Acute hypoxemic respiratory failure due to COVID-19 [facility]) 02/01/2021  ? SOB (shortness of breath) 02/01/2021  ? Centrilobular emphysema ([place]) 12/08/2020  ? Anxiety and depression 10/15/2020  ? Chronic cough 08/31/2020  ? Encounter for screening colonoscopy   ? Polyp of transverse colon   ? Dysphagia   ? Stricture and stenosis of esophagus   ? Cervical spondylosis 03/18/2019  ? 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 Need for prophylactic vaccination and inoculation against influenza 01/25/2019  ? Postoperative breast asymmetry 01/04/2019  ? Status post partial mastectomy 01/04/2019  ? Vitamin D deficiency 12/12/2018  ? Bursitis of left shoulder 11/21/2018  ? Rotator cuff tendinitis, left 11/21/2018  ? Genetic testing 02/01/2018  ? Skin cancer 01/18/2018  ? Goals of care, counseling/discussion 01/12/2018  ? B12 deficiency 12/28/2017  ? Malignant neoplasm of lower-outer quadrant of right breast of female, estrogen receptor negative ([place]) 11/24/2017  ? Chronic low back pain (Secondary Area of Pain) (B) (R>L) 04/03/2017  ? Chronic knee pain [facility] Area of Pain) (R) 04/03/2017  ? Chronic pain syndrome 04/03/2017  ? Other specified health status 04/03/2017  ? Other long term (current) drug therapy 04/03/2017  ? Long term current use of opiate analgesic 04/03/2017  ? Chronic sacroiliac joint pain 04/03/2017  ? S/P repair  of ventral hernia 03/03/2017  ? Incisional hernia, without obstruction or gangrene 02/13/2017  ? Right adrenal mass ([place]) 12/28/2016  ? RIGHT Adrenal Mass 12/16/2016  ? Fatigue 02/17/2016  ? Other fatigue 02/17/2016  ? Multiple lung nodules on CT 02/17/2016  ? History of right breast cancer 02/17/2016  ? Peripheral neuropathy 02/17/2016  ? Closed fracture of a rib 02/02/2015  ? History of fundoplication 20/94/7096  ? History of throat cancer 01/15/2015  ? Acid reflux 01/15/2015  ? Gonalgia 01/15/2015  ? Closed fracture of pubis ([place]) 01/15/2015  ? Pelvic fracture ([place]) 01/12/2015  ? 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 Herpes simplex virus (HSV) epithelial keratitis 03/06/2013  ? Nuclear sclerotic cataract 03/06/2013  ? Onychia of finger 10/23/2008  ? Odontogenic tumor 03/13/2008  ? Tobacco use 06/14/2007  ? ? ?Conditions to be addressed/monitored: Depression and caregiver stress ; Mental Health Concerns  ? ?Care Plan : General Social Work (Adult)  ?Updates made by Vern Claude, LCSW since 08/17/2021 12:00 AM  ?  ? ?Problem: Caregiver Stress   ?  ? ?Goal: Caregiver Coping Optimized   ?  Start Date: 08/17/2021  ?Priority: High  ?Note:   ?CARE PLAN ENTRY ?(see longitudinal plan of care for additional care plan information) ? ?Current Barriers:  ?Knowledge deficits related to accessing mental health provider in patient with Stress and adjustment reaction ?Patient is experiencing symptoms of  depression and stress which seem to be exacerbated by caregiver stress.     ?Patient needs Support, Education, and Care Coordination in order to meet unmet mental health needs  ?Limited social support and caregiver stress ? ?Clinical Social Work Goal(s):  ?Over the next 90 days, patient will work with SW bi-weekly by telephone or in person to reduce or manage symptoms of depression and stress until connected for ongoing counseling resources.  ?Patient will implement clinical interventions discussed today to decreases symptoms  of depression and stress and increase knowledge and/or ability of: self-management skills. ? ?Interventions:  ?Assessed patient's understanding, education, previous treatment and care coordination needs

## 2021-08-17 NOTE — Telephone Encounter (Signed)
Requested medication (s) are due for refill today:   Provider to review ? ?Requested medication (s) are on the active medication list:   Yes ? ?Future visit scheduled:   Yes ? ? ?Last ordered: 04/22/2021 #30, 3 refills ? ?Non delegated refill  ? ?Requested Prescriptions  ?Pending Prescriptions Disp Refills  ? cyclobenzaprine (FLEXERIL) 5 MG tablet [Pharmacy Med Name: CYCLOBENZAPRINE HCL 5 MG TAB] 30 tablet 3  ?  Sig: TAKE 1 TABLET BY MOUTH 3 TIMES DAILY AS NEEDED FOR MUSCLE SPASMS  ?  ? Not Delegated - Analgesics:  Muscle Relaxants Failed - 08/16/2021  1:56 PM  ?  ?  Failed - This refill cannot be delegated  ?  ?  Passed - Valid encounter within last 6 months  ?  Recent Outpatient Visits   ? ?      ? 1 month ago Cervical myelopathy ([place])  ? [facility] Jerrol Banana., MD  ? 2 months ago Pulmonary emphysema, unspecified emphysema type [facility])  ? [facility] Tally Joe T, FNP  ? 2 months ago Acute bronchitis with COPD ([place])  ? [facility] [name], FNP  ? 5 months ago Acute hypoxemic respiratory failure due to COVID-19 [facility])  ? [facility] Jerrol Banana., MD  ? 6 months ago Chronic cough  ? [facility] Thedore Mins, [place], PA-C  ? ?  ?  ?Future Appointments   ? ?        ? In 2 months [name], [place], [facility]  ? ?  ? ? ?  ?  ?  ? ?

## 2021-08-17 NOTE — Patient Instructions (Signed)
Visit Information ? ?Thank you for taking time to visit with me today. Please don't hesitate to contact me if I can be of assistance to you before our next scheduled telephone appointment. ? ?Following are the goals we discussed today:  ?- join a support group ?- talk about feelings with a friend, family or spiritual advisor ?- practice positive thinking and self-talk ? ?Our next appointment is by telephone on 08/31/21 at 2pm ? ?Please call the care guide team at [phone] if you need to cancel or reschedule your appointment.  ? ?If you are experiencing a Mental Health or [place] or need someone to talk to, please call the Suicide and Crisis Lifeline: 988  ? ?Following is a copy of your full plan of care:  ?Care Plan : General Social Work (Adult)  ?Updates made by Denise Claude, LCSW since 08/17/2021 12:00 AM  ?  ? ?Problem: Caregiver Stress   ?  ? ?Goal: Caregiver Coping Optimized   ?Start Date: 08/17/2021  ?Priority: High  ?Note:   ?CARE PLAN ENTRY ?(see longitudinal plan of care for additional care plan information) ? ?Current Barriers:  ?Knowledge deficits related to accessing mental health provider in patient with Stress and adjustment reaction ?Patient is experiencing symptoms of  depression and stress which seem to be exacerbated by caregiver stress.     ?Patient needs Support, Education, and Care Coordination in order to meet unmet mental health needs  ?Limited social support and caregiver stress ? ?Clinical Social Work Goal(s):  ?Over the next 90 days, patient will work with SW bi-weekly by telephone or in person to reduce or manage symptoms of depression and stress until connected for ongoing counseling resources.  ?Patient will implement clinical interventions discussed today to decreases symptoms of depression and stress and increase knowledge and/or ability of: self-management skills. ? ?Interventions:  ?Assessed patient's understanding, education, previous treatment and care  coordination needs  ?Patient interviewed and appropriate assessments performed: PHQ 2 ?Provided basic mental health support, education and interventions  ?Processed patient's feelings regarding her caregiver demands related to care for her spouse ?Emphasized the need for self care, recommended follow up with her provider due to own personal medical concerns ?Collaborated with appropriate clinical care team members regarding patient need ?Other interventions include: Motivational Interviewing employed ?Active listening / Reflection utilized  ?Emotional Support Provided ?Caregiver stress acknowledged  ?Consideration of in-home help encouraged : options discussed including palliative care/hospice ? ?Patient Self Care Activities & Deficits:  ?Patient is unable to independently navigate community resource options without care coordination support ?Patient is able to implement clinical interventions discussed today and is motivated for treatment  ?Performs ADL's independently ?Performs IADL's independently ? ?Initial goal documentation ? ? ? ? ? ? ? ?  ? ?Task: Recognize and Manage Caregiver Stress   ?  ? ? ?Denise Watts was given information about Care Management services by the embedded care coordination team including:  ?Care Management services include personalized support from designated clinical staff supervised by her physician, including individualized plan of care and coordination with other care providers ?24/7 contact phone numbers for assistance for urgent and routine care needs. ?The patient may stop CCM services at any time (effective at the end of the month) by phone call to the office staff. ? ?Patient agreed to services and verbal consent obtained.  ? ?Patient verbalizes understanding of instructions and care plan provided today and agrees to view in [place]. Active MyChart status confirmed with patient.   ? ?Telephone  follow up appointment with care management team member scheduled for: 08/31/21 ? ? ?Denise Connors, LCSW ?Clinical Social Worker  ?[facility] ?[phone] ? ? ?  ?

## 2021-08-23 DIAGNOSIS — U071 COVID-19: Secondary | ICD-10-CM | POA: Diagnosis not present

## 2021-08-24 DIAGNOSIS — D0471 Carcinoma in situ of skin of right lower limb, including hip: Secondary | ICD-10-CM | POA: Diagnosis not present

## 2021-08-24 DIAGNOSIS — C44722 Squamous cell carcinoma of skin of right lower limb, including hip: Secondary | ICD-10-CM | POA: Diagnosis not present

## 2021-08-25 ENCOUNTER — Other Ambulatory Visit: Payer: Self-pay | Admitting: *Deleted

## 2021-08-25 DIAGNOSIS — Z171 Estrogen receptor negative status [ER-]: Secondary | ICD-10-CM

## 2021-08-25 DIAGNOSIS — E538 Deficiency of other specified B group vitamins: Secondary | ICD-10-CM

## 2021-08-25 DIAGNOSIS — C7491 Malignant neoplasm of unspecified part of right adrenal gland: Secondary | ICD-10-CM

## 2021-08-26 ENCOUNTER — Encounter: Payer: Self-pay | Admitting: Physician Assistant

## 2021-08-26 ENCOUNTER — Ambulatory Visit (INDEPENDENT_AMBULATORY_CARE_PROVIDER_SITE_OTHER): Payer: Medicare HMO | Admitting: Physician Assistant

## 2021-08-26 ENCOUNTER — Other Ambulatory Visit: Payer: Self-pay | Admitting: Family Medicine

## 2021-08-26 VITALS — BP 86/64 | HR 103 | Temp 98.8°F | Resp 18 | Wt 139.8 lb

## 2021-08-26 DIAGNOSIS — R0600 Dyspnea, unspecified: Secondary | ICD-10-CM | POA: Diagnosis not present

## 2021-08-26 DIAGNOSIS — Z85819 Personal history of malignant neoplasm of unspecified site of lip, oral cavity, and pharynx: Secondary | ICD-10-CM | POA: Diagnosis not present

## 2021-08-26 DIAGNOSIS — R918 Other nonspecific abnormal finding of lung field: Secondary | ICD-10-CM

## 2021-08-26 DIAGNOSIS — R002 Palpitations: Secondary | ICD-10-CM | POA: Diagnosis not present

## 2021-08-26 DIAGNOSIS — H269 Unspecified cataract: Secondary | ICD-10-CM

## 2021-08-26 DIAGNOSIS — J439 Emphysema, unspecified: Secondary | ICD-10-CM | POA: Diagnosis not present

## 2021-08-26 DIAGNOSIS — I83891 Varicose veins of right lower extremities with other complications: Secondary | ICD-10-CM | POA: Diagnosis not present

## 2021-08-26 NOTE — Progress Notes (Signed)
?  ?I,Ladeana Laplant Robinson,acting as a Education administrator for [employer], PA-C.,have documented all relevant documentation on the behalf of Mardene Speak, PA-C,as directed by  [employer], PA-C while in the presence of [employer], PA-C. ? ? ? ?Established patient visit ? ? ?Patient: Denise Watts   DOB: [birth year]   66 y.o. Female  MRN: [id number] ?Visit Date: 08/26/2021 ? ?Today's healthcare provider: Mardene Speak, PA-C  ? ?Chief Complaint  ?Patient presents with  ? Cough  ? ?Subjective  ?  ?HPI  ? ?Upper respiratory symptoms ?She complains of achiness, congestion, cough described as productive of white sputum, headache described as throbbing, lightheadedness, and productive cough with  white colored sputum.with no fever, chills, night sweats or weight loss. Onset of symptoms was a few weeks ago and gradually worsening.She is drinking moderate amounts of fluids.  Past history is significant for bronchiectasis. Patient is non-smoker  ?Patient reports dizziness for months.  ?    Patient was seen in 05-26-21 for acute bronchitis with COPD ?---------------------------------------------------------------------------------------------------  ?Medications: ?Outpatient Medications Prior to Visit  ?Medication Sig  ? albuterol (VENTOLIN HFA) 108 (90 Base) MCG/ACT inhaler Inhale 2 puffs into the lungs every 6 (six) hours as needed for shortness of breath.  ? aspirin EC 81 MG tablet Take 1 tablet (81 mg total) by mouth daily. Swallow whole.  ? benzonatate (TESSALON) 100 MG capsule TAKE 2 CAPSULES (200 MG TOTAL) BY MOUTH 3 TIMES DAILY  ? Budeson-Glycopyrrol-Formoterol (BREZTRI AEROSPHERE) 160-9-4.8 MCG/ACT AERO Inhale 2 puffs into the lungs in the morning and at bedtime.  ? buPROPion (WELLBUTRIN) 75 MG tablet Take 75 mg by mouth 2 (two) times daily.  ? busPIRone (BUSPAR) 5 MG tablet Take 1 tablet (5 mg total) by mouth 2 (two) times daily.  ? cyclobenzaprine (FLEXERIL) 5 MG tablet TAKE 1 TABLET BY MOUTH 3 TIMES DAILY AS NEEDED FOR MUSCLE  SPASMS  ? DULoxetine (CYMBALTA) 60 MG capsule Take 1 capsule (60 mg total) by mouth daily.  ? fluconazole (DIFLUCAN) 100 MG tablet Take 1 tablet (100 mg total) by mouth daily.  ? fluticasone (FLONASE) 50 MCG/ACT nasal spray Place 1 spray into both nostrils at bedtime.  ? gabapentin (NEURONTIN) 300 MG capsule Take 1 capsule (300 mg total) by mouth 3 (three) times daily.  ? montelukast (SINGULAIR) 10 MG tablet Take 1 tablet (10 mg total) by mouth at bedtime.  ? omeprazole (PRILOSEC) 40 MG capsule Take 40 mg by mouth in the morning and at bedtime.  ? ondansetron (ZOFRAN ODT) 4 MG disintegrating tablet Take 1 tablet (4 mg total) by mouth every 8 (eight) hours as needed.  ? ondansetron (ZOFRAN) 4 MG tablet Take 1 tablet (4 mg total) by mouth every 8 (eight) hours as needed for nausea or vomiting.  ? oxybutynin (DITROPAN XL) 15 MG 24 hr tablet Take 1 tablet (15 mg total) by mouth daily.  ? rOPINIRole (REQUIP) 0.25 MG tablet TAKE 1 TO 2 TABLETS BY MOUTH AT BEDTIME FOR RESTLESS LEGS.  ? atorvastatin (LIPITOR) 40 MG tablet Take 1 tablet (40 mg total) by mouth daily.  ? brompheniramine-pseudoephedrine-DM 30-2-10 MG/5ML syrup Take 5 mLs by mouth 4 (four) times daily as needed. (Patient not taking: Reported on 08/26/2021)  ? HYDROcodone-acetaminophen (NORCO/VICODIN) 5-325 MG tablet Take 1 tablet by mouth every 4 (four) hours as needed for moderate pain.  ? predniSONE (DELTASONE) 20 MG tablet Take 1 tablet (20 mg total) by mouth daily with breakfast.  ? ?Facility-Administered Medications Prior to Visit  ?Medication Dose Route  Frequency Provider  ? cyanocobalamin ((VITAMIN B-12)) injection 1,000 mcg  1,000 mcg Intramuscular Q30 days Sindy Guadeloupe, MD  ? ? ?Review of Systems  ?Constitutional:  Negative for chills, diaphoresis, fever and unexpected weight change.  ?HENT:  Positive for congestion.   ?Respiratory:  Positive for cough.   ?Neurological:  Positive for headaches.  ? ?See hpi ? ?  Objective  ?  ?BP (!) 86/64 (BP Location:  Right Arm, Patient Position: Sitting, Cuff Size: Normal)   Pulse (!) 103   Temp 98.8 ?F (37.1 ?C) (Oral)   Resp 18   Wt 139 lb 12.8 oz (63.4 kg)   SpO2 95%   BMI 26.41 kg/m?  ? ? ?Physical Exam ?Vitals and nursing note reviewed.  ?Constitutional:   ?   General: She is in acute distress.  ?   Appearance: Normal appearance. She is normal weight.  ?HENT:  ?   Head: Normocephalic and atraumatic.  ?   Right Ear: There is impacted cerumen (partially).  ?   Left Ear: There is impacted cerumen (partially).  ?   Nose: Congestion present.  ?Eyes:  ?   Extraocular Movements: Extraocular movements intact.  ?   Pupils: Pupils are equal, round, and reactive to light.  ?Cardiovascular:  ?   Rate and Rhythm: Regular rhythm. Tachycardia present.  ?   Pulses: Normal pulses.  ?Pulmonary:  ?   Effort: Pulmonary effort is normal.  ?   Breath sounds: Rales (right side, lower field) present.  ?   Comments: Diminished lung sounds ?Neurological:  ?   Mental Status: She is alert.  ?  ? ?No results found for any visits on 08/26/21. ? Assessment & Plan  ?  ? ?Multiple lung nodules on CT ?2.  H/o 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[place]) ?3.  Pulmonary emphysema, unspecified emphysema type  ?4.  Dyspnea, unspecified type ?Could be exacerbation of COPD or HF or bronchitis/pneumonia ? ?- CBC with Differential/Platelet ?- Comprehensive Metabolic Panel (CMET) ?- TSH ?- Pro b natriuretic peptide (BNP)[facility]) ?- DG Chest 2 View; Future ?- Might need to contact Pulmonology for evaluation ?- EKG showed normal sinus rhythm , similar to previous EKG form 09/2020 ? ?5. Varicose veins of right lower extremity with mild ankle edema ?Chronic. Stable for patient. ?Compression stockings were advised, raise legs above heart level, consume low salt diet. ? ?6. Malignant neoplasm of lower-outer quadrant of right breast of female, estrogen receptor negative ([place]) ?Managed by Oncology ? ?FU as scheduled ? ?The patient was advised to call back or  seek an in-person evaluation if the symptoms worsen or if the condition fails to improve as anticipated. ? ?I discussed the assessment and treatment plan with the patient. The patient was provided an opportunity to ask questions and all were answered. The patient agreed with the plan and demonstrated an understanding of the instructions. ? ?The entirety of the information documented in the History of Present Illness, Review of Systems and Physical Exam were personally obtained by me. Portions of this information were initially documented by the CMA and reviewed by me for thoroughness and accuracy.   ?Portions of this note were created using dictation software and may contain typographical errors.   ?  ?  ? Total encounter time more than 30 minutes ? Greater than 50% was spent in counseling and coordination of care with the patient  ?Mardene Speak, PA-C  ?[place] ?[phone] (phone) ?[phone] (fax) ? ?[facility] ?

## 2021-08-27 ENCOUNTER — Inpatient Hospital Stay (HOSPITAL_BASED_OUTPATIENT_CLINIC_OR_DEPARTMENT_OTHER): Payer: Medicare HMO | Admitting: Oncology

## 2021-08-27 ENCOUNTER — Inpatient Hospital Stay: Payer: Medicare HMO

## 2021-08-27 ENCOUNTER — Inpatient Hospital Stay: Payer: Medicare HMO | Attending: Oncology

## 2021-08-27 VITALS — BP 92/77 | HR 95 | Temp 97.0°F | Resp 16 | Wt 140.9 lb

## 2021-08-27 DIAGNOSIS — Z853 Personal history of malignant neoplasm of breast: Secondary | ICD-10-CM | POA: Diagnosis not present

## 2021-08-27 DIAGNOSIS — E538 Deficiency of other specified B group vitamins: Secondary | ICD-10-CM | POA: Insufficient documentation

## 2021-08-27 DIAGNOSIS — C7491 Malignant neoplasm of unspecified part of right adrenal gland: Secondary | ICD-10-CM

## 2021-08-27 DIAGNOSIS — E611 Iron deficiency: Secondary | ICD-10-CM | POA: Diagnosis not present

## 2021-08-27 DIAGNOSIS — Z23 Encounter for immunization: Secondary | ICD-10-CM

## 2021-08-27 DIAGNOSIS — C50511 Malignant neoplasm of lower-outer quadrant of right female breast: Secondary | ICD-10-CM

## 2021-08-27 DIAGNOSIS — Z8521 Personal history of malignant neoplasm of larynx: Secondary | ICD-10-CM | POA: Insufficient documentation

## 2021-08-27 DIAGNOSIS — Z08 Encounter for follow-up examination after completed treatment for malignant neoplasm: Secondary | ICD-10-CM | POA: Diagnosis not present

## 2021-08-27 DIAGNOSIS — R0600 Dyspnea, unspecified: Secondary | ICD-10-CM | POA: Diagnosis not present

## 2021-08-27 DIAGNOSIS — Z171 Estrogen receptor negative status [ER-]: Secondary | ICD-10-CM | POA: Diagnosis not present

## 2021-08-27 LAB — VITAMIN B12: Vitamin B-12: 245 pg/mL (ref 180–914)

## 2021-08-27 LAB — COMPREHENSIVE METABOLIC PANEL
ALT: 12 U/L (ref 0–44)
AST: 19 U/L (ref 15–41)
Albumin: 3.7 g/dL (ref 3.5–5.0)
Alkaline Phosphatase: 76 U/L (ref 38–126)
Anion gap: 3 — ABNORMAL LOW (ref 5–15)
BUN: 15 mg/dL (ref 8–23)
CO2: 28 mmol/L (ref 22–32)
Calcium: 8.5 mg/dL — ABNORMAL LOW (ref 8.9–10.3)
Chloride: 106 mmol/L (ref 98–111)
Creatinine, Ser: 0.69 mg/dL (ref 0.44–1.00)
GFR, Estimated: >60 mL/min (ref >60)
Glucose, Bld: 107 mg/dL — ABNORMAL HIGH (ref 70–99)
Potassium: 4.1 mmol/L (ref 3.5–5.1)
Sodium: 137 mmol/L (ref 135–145)
Total Bilirubin: 0.5 mg/dL (ref 0.3–1.2)
Total Protein: 6.9 g/dL (ref 6.5–8.1)

## 2021-08-27 LAB — CBC WITH DIFFERENTIAL/PLATELET
Abs Immature Granulocytes: 0.03 10*3/uL (ref 0.00–0.07)
Basophils Absolute: 0 10*3/uL (ref 0.0–0.1)
Basophils Relative: 1 %
Eosinophils Absolute: 0.3 10*3/uL (ref 0.0–0.5)
Eosinophils Relative: 5 %
HCT: 39.7 % (ref 36.0–46.0)
Hemoglobin: 12.6 g/dL (ref 12.0–15.0)
Immature Granulocytes: 1 %
Lymphocytes Relative: 20 %
Lymphs Abs: 1.2 10*3/uL (ref 0.7–4.0)
MCH: 31.5 pg (ref 26.0–34.0)
MCHC: 31.7 g/dL (ref 30.0–36.0)
MCV: 99.3 fL (ref 80.0–100.0)
Monocytes Absolute: 0.5 10*3/uL (ref 0.1–1.0)
Monocytes Relative: 9 %
Neutro Abs: 3.9 10*3/uL (ref 1.7–7.7)
Neutrophils Relative %: 64 %
Platelets: 180 10*3/uL (ref 150–400)
RBC: 4 MIL/uL (ref 3.87–5.11)
RDW: 14.3 % (ref 11.5–15.5)
WBC: 5.9 10*3/uL (ref 4.0–10.5)
nRBC: 0 % (ref 0.0–0.2)

## 2021-08-27 LAB — IRON AND TIBC
Iron: 47 ug/dL (ref 28–170)
Saturation Ratios: 14 % (ref 10.4–31.8)
TIBC: 349 ug/dL (ref 250–450)
UIBC: 302 ug/dL

## 2021-08-27 LAB — FERRITIN: Ferritin: 16 ng/mL (ref 11–307)

## 2021-08-27 MED ORDER — CYANOCOBALAMIN 1000 MCG/ML IJ SOLN
1000.0000 ug | INTRAMUSCULAR | Status: AC
  Administered 2021-08-27: 1000 ug via INTRAMUSCULAR
  Filled 2021-08-27: qty 1

## 2021-08-27 MED ORDER — CYANOCOBALAMIN 1000 MCG/ML IJ SOLN: 1000.0000 ug | Freq: Once | INTRAMUSCULAR | Status: DC

## 2021-08-27 NOTE — Progress Notes (Signed)
Pt states that she has been noticing more tingling in her rt arm along with redness; 7/10 left leg pain.  ?

## 2021-08-28 ENCOUNTER — Encounter: Payer: Self-pay | Admitting: Physician Assistant

## 2021-08-28 LAB — COMPREHENSIVE METABOLIC PANEL
ALT: 12 IU/L (ref 0–32)
AST: 17 IU/L (ref 0–40)
Albumin/Globulin Ratio: 2 (ref 1.2–2.2)
Albumin: 4.3 g/dL (ref 3.8–4.8)
Alkaline Phosphatase: 86 IU/L (ref 44–121)
BUN/Creatinine Ratio: 16 (ref 12–28)
BUN: 13 mg/dL (ref 8–27)
Bilirubin Total: 0.5 mg/dL (ref 0.0–1.2)
CO2: 26 mmol/L (ref 20–29)
Calcium: 9.4 mg/dL (ref 8.7–10.3)
Chloride: 103 mmol/L (ref 96–106)
Creatinine, Ser: 0.81 mg/dL (ref 0.57–1.00)
Globulin, Total: 2.1 g/dL (ref 1.5–4.5)
Glucose: 87 mg/dL (ref 70–99)
Potassium: 4.6 mmol/L (ref 3.5–5.2)
Sodium: 144 mmol/L (ref 134–144)
Total Protein: 6.4 g/dL (ref 6.0–8.5)
eGFR: 79 mL/min/{1.73_m2} (ref >59)

## 2021-08-28 LAB — CBC WITH DIFFERENTIAL/PLATELET
Basophils Absolute: 0 10*3/uL (ref 0.0–0.2)
Basos: 1 %
EOS (ABSOLUTE): 0.3 10*3/uL (ref 0.0–0.4)
Eos: 5 %
Hematocrit: 38.6 % (ref 34.0–46.6)
Hemoglobin: 12.8 g/dL (ref 11.1–15.9)
Immature Grans (Abs): 0 10*3/uL (ref 0.0–0.1)
Immature Granulocytes: 0 %
Lymphocytes Absolute: 1.1 10*3/uL (ref 0.7–3.1)
Lymphs: 16 %
MCH: 31.4 pg (ref 26.6–33.0)
MCHC: 33.2 g/dL (ref 31.5–35.7)
MCV: 95 fL (ref 79–97)
Monocytes Absolute: 0.6 10*3/uL (ref 0.1–0.9)
Monocytes: 9 %
Neutrophils Absolute: 4.7 10*3/uL (ref 1.4–7.0)
Neutrophils: 69 %
Platelets: 203 10*3/uL (ref 150–450)
RBC: 4.07 x10E6/uL (ref 3.77–5.28)
RDW: 13.2 % (ref 11.7–15.4)
WBC: 6.7 10*3/uL (ref 3.4–10.8)

## 2021-08-28 LAB — PRO B NATRIURETIC PEPTIDE: NT-Pro BNP: 60 pg/mL (ref 0–301)

## 2021-08-28 LAB — TSH: TSH: 0.858 u[IU]/mL (ref 0.450–4.500)

## 2021-08-29 ENCOUNTER — Emergency Department: Payer: Medicare HMO

## 2021-08-29 ENCOUNTER — Emergency Department
Admission: EM | Admit: 2021-08-29 | Discharge: 2021-08-30 | Disposition: A | Discharge status: Home / Self Care | Payer: Medicare HMO | Attending: Emergency Medicine | Admitting: Emergency Medicine

## 2021-08-29 ENCOUNTER — Other Ambulatory Visit: Payer: Self-pay

## 2021-08-29 ENCOUNTER — Encounter: Payer: Self-pay | Admitting: *Deleted

## 2021-08-29 ENCOUNTER — Other Ambulatory Visit: Payer: Self-pay | Admitting: Physician Assistant

## 2021-08-29 ENCOUNTER — Encounter: Payer: Self-pay | Admitting: Oncology

## 2021-08-29 DIAGNOSIS — M79604 Pain in right leg: Secondary | ICD-10-CM | POA: Diagnosis not present

## 2021-08-29 DIAGNOSIS — R0600 Dyspnea, unspecified: Secondary | ICD-10-CM | POA: Diagnosis not present

## 2021-08-29 DIAGNOSIS — R0602 Shortness of breath: Secondary | ICD-10-CM | POA: Diagnosis not present

## 2021-08-29 DIAGNOSIS — G8918 Other acute postprocedural pain: Secondary | ICD-10-CM | POA: Diagnosis not present

## 2021-08-29 DIAGNOSIS — F32A Depression, unspecified: Secondary | ICD-10-CM

## 2021-08-29 DIAGNOSIS — M7989 Other specified soft tissue disorders: Secondary | ICD-10-CM | POA: Diagnosis not present

## 2021-08-29 DIAGNOSIS — L03115 Cellulitis of right lower limb: Secondary | ICD-10-CM | POA: Insufficient documentation

## 2021-08-29 DIAGNOSIS — F419 Anxiety disorder, unspecified: Secondary | ICD-10-CM

## 2021-08-29 LAB — CBC WITH DIFFERENTIAL/PLATELET
Abs Immature Granulocytes: 0.02 10*3/uL (ref 0.00–0.07)
Basophils Absolute: 0 10*3/uL (ref 0.0–0.1)
Basophils Relative: 0 %
Eosinophils Absolute: 0.4 10*3/uL (ref 0.0–0.5)
Eosinophils Relative: 5 %
HCT: 41.2 % (ref 36.0–46.0)
Hemoglobin: 12.6 g/dL (ref 12.0–15.0)
Immature Granulocytes: 0 %
Lymphocytes Relative: 20 %
Lymphs Abs: 1.4 10*3/uL (ref 0.7–4.0)
MCH: 30.4 pg (ref 26.0–34.0)
MCHC: 30.6 g/dL (ref 30.0–36.0)
MCV: 99.3 fL (ref 80.0–100.0)
Monocytes Absolute: 0.5 10*3/uL (ref 0.1–1.0)
Monocytes Relative: 7 %
Neutro Abs: 4.7 10*3/uL (ref 1.7–7.7)
Neutrophils Relative %: 68 %
Platelets: 217 10*3/uL (ref 150–400)
RBC: 4.15 MIL/uL (ref 3.87–5.11)
RDW: 14.3 % (ref 11.5–15.5)
WBC: 6.9 10*3/uL (ref 4.0–10.5)
nRBC: 0 % (ref 0.0–0.2)

## 2021-08-29 LAB — COMPREHENSIVE METABOLIC PANEL
ALT: 12 U/L (ref 0–44)
AST: 23 U/L (ref 15–41)
Albumin: 3.8 g/dL (ref 3.5–5.0)
Alkaline Phosphatase: 80 U/L (ref 38–126)
Anion gap: 6 (ref 5–15)
BUN: 13 mg/dL (ref 8–23)
CO2: 29 mmol/L (ref 22–32)
Calcium: 9.2 mg/dL (ref 8.9–10.3)
Chloride: 108 mmol/L (ref 98–111)
Creatinine, Ser: 0.8 mg/dL (ref 0.44–1.00)
GFR, Estimated: >60 mL/min (ref >60)
Glucose, Bld: 98 mg/dL (ref 70–99)
Potassium: 3.9 mmol/L (ref 3.5–5.1)
Sodium: 143 mmol/L (ref 135–145)
Total Bilirubin: 0.5 mg/dL (ref 0.3–1.2)
Total Protein: 6.8 g/dL (ref 6.5–8.1)

## 2021-08-29 LAB — LACTIC ACID, PLASMA: Lactic Acid, Venous: 1.4 mmol/L (ref 0.5–1.9)

## 2021-08-29 MED ORDER — OXYCODONE HCL 5 MG PO TABS: 5.0000 mg | ORAL_TABLET | Freq: Four times a day (QID) | ORAL | 0 refills | Status: DC | PRN

## 2021-08-29 MED ORDER — OXYCODONE HCL 5 MG PO TABS
5.0000 mg | ORAL_TABLET | Freq: Once | ORAL | Status: AC
  Administered 2021-08-29: 5 mg via ORAL
  Filled 2021-08-29: qty 1

## 2021-08-29 MED ORDER — SODIUM CHLORIDE 0.9 % IV SOLN
1.0000 g | Freq: Once | INTRAVENOUS | Status: AC
  Administered 2021-08-29: 1 g via INTRAVENOUS
  Filled 2021-08-29: qty 10

## 2021-08-29 MED ORDER — CEPHALEXIN 500 MG PO CAPS: 500.0000 mg | ORAL_CAPSULE | Freq: Two times a day (BID) | ORAL | 0 refills | Status: AC

## 2021-08-29 NOTE — Progress Notes (Signed)
Contacted patient. Encouraged her to use oxygen if needed, to start albuterol PRN and Breztri daily as was prescribed by pulmonology in 10/2020, to contact pulmonology for evaluation. She has last exacerbation in 05/2021, was treated with abx and prednisone and was recommended to scheduled an appt with pulmonology. She did not. ?Patient was advised to do chest XR as an order was placed on Thursday. Patient expressed an understanding and agreed. ?Patient will be provided with free samples of Breztri.. Prednisone will be sent to her designated pharmacy for control of the current COPD exacerbation. If symptoms would not improve with bronchodilators use and steroid Rx, might add antibiotic-cipro for 3-5 days ? ?Patient reports having right leg swelling, warmth, tenderness and redness after removal of skin ca at dermatology office on Tuesday. ?Patient advised to go to ER for assessment. ?

## 2021-08-29 NOTE — Progress Notes (Signed)
? ? ? ?Hematology/Oncology Consult note ?[facility]  ?[phone] [phone] ? ?Patient Care Team: ?[name], FNP as PCP - General (Family Medicine) ?Sindy Guadeloupe, MD as Consulting Physician (Oncology) ?Jules Husbands, MD as Consulting Physician (General Surgery) ?Dasher, Rayvon Char, MD (Dermatology) ?Scheeler, Carola Rhine, PA-C as Physician Assistant (Plastic Surgery) ?Harvest Dark, MD as Attending Physician (Emergency Medicine) ?Vertell Limber (Neurology) ?Marlowe Sax, MD as Referring Physician (Internal Medicine) ?Chesley Mires, MD as Consulting Physician (Pulmonary Disease) ?Land, [place], [place] as Education officer, museum  ? ?Name of the patient: Denise Watts  ?[id number]  ?Dec 18, 1955  ? ?Date of visit: 08/29/21 ? ?Diagnosis-  1. H/o breast cancer in 1997 2. H/o stage I SCC of vocal cord in 2015. 3. Lung nodules 4. Adrenal carcinoma of unknown primary s/p resection 5. Stage IaT1a NxcM0 ER weakly positive 1-10%, PR negative and her 2 negative ? ?Chief complaint/ Reason for visit- routine f/u of breast cancer ? ?Heme/Onc history:  ?Oncology History Overview Note  ?1. Carcinoma of breast, T1, N0, M0 tumor diagnosis.  In January of 1997 ?2. Carcinoma of the vocal cord T1, N0, M0 tumor.  Status postradiation therapy ?3. Abnormal CT scan of the chest (December, 2015) ?4. Repeat CT scan of chest shows stable nodule (July, 2016) ?5. Neutropenia with normal hemoglobin and normal platelet count (July, 2016) ?  ?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[place])  ?11/10/2014 Initial Diagnosis  ? Cancer of vocal cord ? ?  ?Malignant neoplasm of lower-outer quadrant of right breast of female, estrogen receptor negative ([place])  ?11/24/2017 Initial Diagnosis  ? Malignant neoplasm of lower-outer quadrant of right breast of female, estrogen receptor negative ([place]) ? ?  ?01/12/2018 Cancer Staging  ? Staging form: Breast, AJCC 8th Edition ?- Clinical stage from 01/12/2018: Stage IB (cT1a, cN0, cM0,  G3, ER+, PR-, HER2-) - Signed by Sindy Guadeloupe, MD on 01/12/2018 ? ?  ?01/30/2018 - 04/26/2018 Chemotherapy  ? The patient had dexamethasone (DECADRON) 4 MG tablet, 8 mg, Oral, 2 times daily, 1 of 1 cycle, Start date: 01/12/2018, End date: 08/03/2018 ?palonosetron (ALOXI) injection 0.25 mg, 0.25 mg, Intravenous,  Once, 4 of 4 cycles ?Administration: 0.25 mg (01/30/2018), 0.25 mg (02/27/2018), 0.25 mg (03/28/2018), 0.25 mg (04/26/2018) ?pegfilgrastim (NEULASTA ONPRO KIT) injection 6 mg, 6 mg, Subcutaneous, Once, 4 of 4 cycles ?Administration: 6 mg (01/30/2018), 6 mg (02/27/2018), 6 mg (03/28/2018), 6 mg (04/26/2018) ?cyclophosphamide (CYTOXAN) 1,000 mg in sodium chloride 0.9 % 250 mL chemo infusion, 600 mg/m2 = 1,000 mg, Intravenous,  Once, 4 of 4 cycles ?Administration: 1,000 mg (01/30/2018), 1,000 mg (02/27/2018), 1,000 mg (03/28/2018), 1,000 mg (04/26/2018) ?DOCEtaxel (TAXOTERE) 130 mg in sodium chloride 0.9 % 250 mL chemo infusion, 75 mg/m2 = 130 mg, Intravenous,  Once, 4 of 4 cycles ?Dose modification: 60 mg/m2 (original dose 75 mg/m2, Cycle 2, Reason: Dose not tolerated) ?Administration: 130 mg (01/30/2018), 100 mg (02/27/2018), 100 mg (03/28/2018), 100 mg (04/26/2018) ? ? for chemotherapy treatment.  ? ?  ? ?The patient was originally diagnosed with right breast cancer in 1997, 10 mm grade 3, ER pr positive her 2 negative, node negative status post surgery and radiation treatment only. She never received chemotherapy or hormone therapy. ?For her vocal cord cancer, she had extensive surgery and radiation therapy in 1998. ?Due to past history of smoking, she was also noted to have lung nodules. She is undergoing active surveillance imaging study of the chest. ?  ?Patient noted to have adrenal mass  on CT chest which prompted PET CT in July 2018 which showed: IMPRESSION: ?1. Right adrenal mass with peripheral hypermetabolism. Given ?interval development since 11/18/2015, suspicious for either ?isolated metastasis or a  primary adrenal neoplasm. This should be ?considered for biopsy. ?2. No evidence of hypermetabolic primary malignancy or extraadrenal ?metastasis. Pulmonary nodules are not significantly hypermetabolic. ?3. Coronary artery atherosclerosis. Aortic Atherosclerosis ?(ICD10-I70.0). ?4. Hepatic steatosis. ?5. Left sixth rib hypermetabolism is favored to be related to remote ?Trauma. ?  ?Patient underwent right adrenalectomy which showed: DIAGNOSIS:  ?A. ADRENAL GLAND, RIGHT; ADRENALECTOMY:  ?- HIGH GRADE CARCINOMA WITH EXTENSIVE NECROSIS.  ?- MARGINS OF THE SPECIMEN ARE NEGATIVE FOR CARCINOMA.  ? ?Comment:  ?Sections demonstrate abundant necrosis confined to the adrenal medullary  ?compartment. In one block of tissue a small fragment of viable malignant  ?neoplasm was identified. A panel of immunohistochemical stains was  ?performed with the following pattern of immunoreactivity:  ?Super pancytokeratin: Positive  ?GATA-3: Negative  ?TTF-1: Negative  ?P40: Negative  ?Napsin: Negative  ?PAX-8: Negative  ?Chromogranin: Negative   ?  ?Repeat CT chest abdomen and pelvis in July 2019 showed stable left lower lobe pulmonary nodule.  No other evidence of metastatic disease.  New 4.5 mm nodule in the right breast.  This was followed by ultrasound mammogram and core biopsy of the breast lesion which was a grade 3 triple negative breast cancer.  Patient was seen by Dr. Bary Castilla and underwent lumpectomy on 12/08/2017. ?  ?Patient had a 5 mm weakly ER positive tumor.  Given her ongoing fatigue Oncotype testing was done to see if she could potentially avoid chemotherapy.  Oncotype shows a recurrence score of 53 with her risk of distant recurrence at 9 years at greater than 39% with hormone therapy alone.  Absolute benefit of chemotherapy was greater than 15% in her age group.  Patient completed 4 cycles of adjuvant TC chemotherapy in December 2019 ?  ?  ? ?Interval history- Patient reports ongoing fatigue. Appetite and weight have  remained stable ? ?ECOG PS- 1 ?Pain scale- 0 ? ? ?Review of systems- Review of Systems  ?Constitutional:  Positive for malaise/fatigue. Negative for chills, fever and weight loss.  ?HENT:  Negative for congestion, ear discharge and nosebleeds.   ?Eyes:  Negative for blurred vision.  ?Respiratory:  Negative for cough, hemoptysis, sputum production, shortness of breath and wheezing.   ?Cardiovascular:  Negative for chest pain, palpitations, orthopnea and claudication.  ?Gastrointestinal:  Negative for abdominal pain, blood in stool, constipation, diarrhea, heartburn, melena, nausea and vomiting.  ?Genitourinary:  Negative for dysuria, flank pain, frequency, hematuria and urgency.  ?Musculoskeletal:  Negative for back pain, joint pain and myalgias.  ?Skin:  Negative for rash.  ?Neurological:  Negative for dizziness, tingling, focal weakness, seizures, weakness and headaches.  ?Endo/Heme/Allergies:  Does not bruise/bleed easily.  ?Psychiatric/Behavioral:  Negative for depression and suicidal ideas. The patient does not have insomnia.    ? ? ? ?Allergies  ?Allergen Reactions  ? Sulfa Antibiotics Other (See Comments)  ?  Mouth sore and raw  ? ? ? ?Past Medical History:  ?Diagnosis Date  ? Anxiety   ? Arthritis   ? KNEE RIGHT  ? Breast cancer ([place]) 1997  ? right breast lumpectomy with radiation  ? Breast cancer ([place]) 11/15/2017  ? INVASIVE MAMMARY CARCINOMA/ triple negative  ? 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[place]) 11/10/2014  ? Cough 03/02/2017  ? INTERMTTENT DRY COUGH  ? Depression   ? GERD (gastroesophageal reflux disease)   ?  History of hiatal hernia   ? Personal history of chemotherapy   ? Personal history of radiation therapy   ? Skin cancer   ? Throat cancer ([place]) 1998  ? radiation  ? ? ? ?Past Surgical History:  ?Procedure Laterality Date  ? BREAST BIOPSY Right 1997  ? positive  ? BREAST BIOPSY Right 11/15/2017  ? INVASIVE MAMMARY CARCINOMA triple negative  ? BREAST BIOPSY Right 07/20/2020  ? [place] bx, Q marker, neg  ? BREAST  LUMPECTOMY Right 1997  ? 2019 also  ? BREAST LUMPECTOMY WITH SENTINEL LYMPH NODE BIOPSY Right 12/08/2017  ? Procedure: BREAST LUMPECTOMY WITH SENTINEL LYMPH NODE BX;  Surgeon: Robert Bellow, MD;  Location: [facility]

## 2021-08-29 NOTE — Discharge Instructions (Addendum)
I suspect this is most likely cellulitis.  Please follow-up with your dermatologist in 2 to 3 days for recheck return if develop fevers, worsening swelling, redness or any other concern ? ?Take oxycodone as prescribed. Do not drink alcohol, drive or participate in any other potentially dangerous activities while taking this medication as it may make you sleepy. Do not take this medication with any other sedating medications, either prescription or over-the-counter. If you were prescribed Percocet or Vicodin, do not take these with acetaminophen (Tylenol) as it is already contained within these medications. ? ?This medication is an opiate (or narcotic) pain medication and can be habit forming. Use it as little as possible to achieve adequate pain control. Do not use or use it with extreme caution if you have a history of opiate abuse or dependence. If you are on a pain contract with your primary care doctor or a pain specialist, be sure to let them know you were prescribed this medication today from the Emergency Department. This medication is intended for your use only - do not give any to anyone else and keep it in a secure place where nobody else, especially children, have access to it. ? ? ?

## 2021-08-29 NOTE — ED Triage Notes (Signed)
Pt had surgical removed three skin lesions r/t skin ca with her dermatologist on Tuesday. Pt R shin appears swollen and red below incision. Incision appears intact with no purulent drainage at this time. Stitches intact as well. Pt endorses increased pain and swelling since the operation. ?

## 2021-08-29 NOTE — ED Provider Notes (Signed)
? ?[facility] ?Provider Note ? ? ? Event Date/Time  ? First MD Initiated Contact with Patient 08/29/21 2150   ?  (approximate) ? ? ?History  ? ?Post-op Problem ? ? ?HPI ? ?Denise Watts is a 66 y.o. female with history of skin cancer who comes in with concern for right leg pain.  Patient reports having skin cancer removed from the right leg on Tuesday.  She reports yesterday developing some pain and redness noted to the leg.  She denies having a clot previously but she does report some calf tenderness as well.  She denies having anything other than Tylenol and ibuprofen which she is taking without relief in symptoms ? ?I reviewed the office visit from 08/27/2021 where patient had breast cancer ? ?  ? ? ?Physical Exam  ? ?Triage Vital Signs: ?ED Triage Vitals  ?Enc Vitals Group  ?   BP 08/29/21 2133 127/70  ?   Pulse Rate 08/29/21 2133 (!) 103  ?   Resp 08/29/21 2133 18  ?   Temp 08/29/21 2133 (!) 97.5 ?F (36.4 ?C)  ?   Temp Source 08/29/21 2133 Oral  ?   SpO2 08/29/21 2133 96 %  ?   Weight 08/29/21 2142 140 lb (63.5 kg)  ?   Height --   ?   Head Circumference --   ?   Peak Flow --   ?   Pain Score 08/29/21 2142 7  ?   Pain Loc --   ?   Pain Edu? --   ?   Excl. in [place]? --   ? ? ?Most recent vital signs: ?Vitals:  ? 08/29/21 2133  ?BP: 127/70  ?Pulse: (!) 103  ?Resp: 18  ?Temp: (!) 97.5 ?F (36.4 ?C)  ?SpO2: 96%  ? ? ? ?General: Awake, no distress.  ?CV:  Good peripheral perfusion.  ?Resp:  Normal effort.  ?Abd:  No distention.  ?Other:  Right leg has good distal pulse but she is got some calf tenderness and swelling noted.  She is got sutures still in place from prior surgery with redness over the lower mid shin.  She is got full range of motion of her joints without any swelling of the joints ? ? ?ED Results / Procedures / Treatments  ? ?Labs ?(all labs ordered are listed, but only abnormal results are displayed) ?Labs Reviewed  ?COMPREHENSIVE METABOLIC PANEL  ?CBC WITH DIFFERENTIAL/PLATELET   ?LACTIC ACID, PLASMA  ?LACTIC ACID, PLASMA  ? ? ? ? ?RADIOLOGY ? ?Ultrasound pending ? ?PROCEDURES: ? ?Critical Care performed: No ? ?Procedures ? ? ?MEDICATIONS ORDERED IN ED: ?Medications  ?cefTRIAXone (ROCEPHIN) 1 g in sodium chloride 0.9 % 100 mL IVPB (1 g Intravenous New Bag/Given 08/29/21 2231)  ?oxyCODONE (Oxy IR/ROXICODONE) immediate release tablet 5 mg (5 mg Oral Given 08/29/21 2231)  ? ? ? ?IMPRESSION / MDM / ASSESSMENT AND PLAN / ED COURSE  ?I reviewed the triage vital signs and the nursing notes. ? ?Patient comes in with slightly tachycardic which I suspect is more likely from pain.  Suspect patient has cellulitis based on examination.  Labs drawn without any evidence of severe infection with normal lactate normal white count normal CMP.  She has good distal pulse I doubt arterial issue.  Given her history of cancers with some calf tenderness and with the swelling we will get ultrasound to make sure no DVT.  Patient handed off pending this and suspect discharge home.  We will give a  dose of ceftriaxone while waiting.  We discussed a short course of oxycodone to help with pain and the risk of this and she would like to proceed ? ? ? ?The patient is on the cardiac monitor to evaluate for evidence of arrhythmia and/or significant heart rate changes. ? ?  ? ? ?FINAL CLINICAL IMPRESSION(S) / ED DIAGNOSES  ? ?Final diagnoses:  ?Post-operative pain  ?Cellulitis of right lower extremity  ? ? ? ?Rx / DC Orders  ? ?ED Discharge Orders   ? ?      Ordered  ?  oxyCODONE (ROXICODONE) 5 MG immediate release tablet  Every 6 hours PRN       ? 08/29/21 2300  ?  cephALEXin (KEFLEX) 500 MG capsule  2 times daily       ? 08/29/21 2300  ? ?  ?  ? ?  ? ? ? ?Note:  This document was prepared using Dragon voice recognition software and may include unintentional dictation errors. ?  ?Vanessa [place], MD ?08/29/21 2301 ? ?

## 2021-08-30 ENCOUNTER — Ambulatory Visit
Admission: RE | Admit: 2021-08-30 | Discharge: 2021-08-30 | Disposition: A | Discharge status: Home / Self Care | Payer: Medicare HMO | Source: Ambulatory Visit | Attending: Physician Assistant | Admitting: Physician Assistant

## 2021-08-30 ENCOUNTER — Other Ambulatory Visit: Payer: Self-pay | Admitting: *Deleted

## 2021-08-30 ENCOUNTER — Telehealth: Payer: Self-pay | Admitting: Physician Assistant

## 2021-08-30 ENCOUNTER — Other Ambulatory Visit: Payer: Self-pay | Admitting: Physician Assistant

## 2021-08-30 DIAGNOSIS — R0600 Dyspnea, unspecified: Secondary | ICD-10-CM | POA: Insufficient documentation

## 2021-08-30 DIAGNOSIS — R918 Other nonspecific abnormal finding of lung field: Secondary | ICD-10-CM | POA: Diagnosis not present

## 2021-08-30 DIAGNOSIS — J209 Acute bronchitis, unspecified: Secondary | ICD-10-CM

## 2021-08-30 DIAGNOSIS — J449 Chronic obstructive pulmonary disease, unspecified: Secondary | ICD-10-CM | POA: Diagnosis not present

## 2021-08-30 MED ORDER — ATORVASTATIN CALCIUM 40 MG PO TABS: 40.0000 mg | ORAL_TABLET | Freq: Every day | ORAL | 0 refills | Status: DC

## 2021-08-30 MED ORDER — ACETAMINOPHEN 500 MG PO TABS
1000.0000 mg | ORAL_TABLET | Freq: Once | ORAL | Status: AC
Start: 2021-08-30 — End: 2021-08-30
  Administered 2021-08-30: 1000 mg via ORAL
  Filled 2021-08-30: qty 2

## 2021-08-30 MED ORDER — PREDNISONE 20 MG PO TABS: 20.0000 mg | ORAL_TABLET | Freq: Every day | ORAL | 0 refills | Status: DC

## 2021-08-30 MED ORDER — OXYCODONE HCL 5 MG PO TABS
5.0000 mg | ORAL_TABLET | Freq: Once | ORAL | Status: AC
  Administered 2021-08-30: 5 mg via ORAL
  Filled 2021-08-30: qty 1

## 2021-08-30 NOTE — Telephone Encounter (Signed)
Please, let this patient know that her medication was sent to pharmacy and breztri boxes was left with Arbie Cookey. Thank you ?

## 2021-08-30 NOTE — ED Provider Notes (Signed)
I was asked by Dr. Nickolas Madrid to follow-up the results of this patient Doppler ultrasound to rule out DVT.  The ultrasound is negative.  Will discharge per plan left by Dr. Nickolas Madrid with antibiotics for cellulitis.  Discussed follow-up, home management, and return precautions with patient ? ? ? ?I, Rudene Re, attending MD, have personally viewed and interpreted the images obtained during this visit as below: ? ?Ultrasound negative for DVT ? ? ?___________________________________________________ ?Interpretation by Radiologist:  ?US Venous Img Lower Unilateral Right ? ?Result Date: 08/29/2021 ?CLINICAL DATA:  Leg swelling EXAM: RIGHT LOWER EXTREMITY VENOUS DOPPLER ULTRASOUND TECHNIQUE: Gray-scale sonography with compression, as well as color and duplex ultrasound, were performed to evaluate the deep venous system(s) from the level of the common femoral vein through the popliteal and proximal calf veins. COMPARISON:  None. FINDINGS: VENOUS Normal compressibility of the common femoral, superficial femoral, and popliteal veins, as well as the visualized calf veins. Visualized portions of profunda femoral vein and great saphenous vein unremarkable. No filling defects to suggest DVT on grayscale or color Doppler imaging. Doppler waveforms show normal direction of venous flow, normal respiratory plasticity and response to augmentation. Limited views of the contralateral common femoral vein are unremarkable. OTHER None. Limitations: none IMPRESSION: Negative. Electronically Signed   By: Merilyn Baba M.D.   On: 08/29/2021 23:59   ? ? ?  ?Rudene Re, MD ?08/30/21 0036 ? ?

## 2021-08-30 NOTE — Progress Notes (Signed)
Error correction: Breztri ?

## 2021-08-30 NOTE — Telephone Encounter (Signed)
Patient has picked up prednisone from pharmacy and will pick up samples from front tomorrow.   ?

## 2021-08-31 ENCOUNTER — Telehealth: Payer: Medicare HMO

## 2021-09-01 ENCOUNTER — Emergency Department: Payer: Medicare HMO

## 2021-09-01 ENCOUNTER — Emergency Department
Admission: EM | Admit: 2021-09-01 | Discharge: 2021-09-01 | Disposition: A | Discharge status: Home / Self Care | Payer: Medicare HMO | Attending: Emergency Medicine | Admitting: Emergency Medicine

## 2021-09-01 DIAGNOSIS — M79604 Pain in right leg: Secondary | ICD-10-CM

## 2021-09-01 DIAGNOSIS — R911 Solitary pulmonary nodule: Secondary | ICD-10-CM | POA: Diagnosis not present

## 2021-09-01 DIAGNOSIS — R918 Other nonspecific abnormal finding of lung field: Secondary | ICD-10-CM | POA: Diagnosis not present

## 2021-09-01 DIAGNOSIS — L03115 Cellulitis of right lower limb: Secondary | ICD-10-CM | POA: Insufficient documentation

## 2021-09-01 DIAGNOSIS — Z853 Personal history of malignant neoplasm of breast: Secondary | ICD-10-CM | POA: Diagnosis not present

## 2021-09-01 DIAGNOSIS — J439 Emphysema, unspecified: Secondary | ICD-10-CM | POA: Diagnosis not present

## 2021-09-01 DIAGNOSIS — Z85828 Personal history of other malignant neoplasm of skin: Secondary | ICD-10-CM | POA: Diagnosis not present

## 2021-09-01 DIAGNOSIS — R600 Localized edema: Secondary | ICD-10-CM | POA: Diagnosis not present

## 2021-09-01 DIAGNOSIS — R0602 Shortness of breath: Secondary | ICD-10-CM | POA: Diagnosis not present

## 2021-09-01 DIAGNOSIS — J9811 Atelectasis: Secondary | ICD-10-CM | POA: Diagnosis not present

## 2021-09-01 DIAGNOSIS — I700 Atherosclerosis of aorta: Secondary | ICD-10-CM | POA: Diagnosis not present

## 2021-09-01 LAB — CBC WITH DIFFERENTIAL/PLATELET
Abs Immature Granulocytes: 0.03 10*3/uL (ref 0.00–0.07)
Basophils Absolute: 0 10*3/uL (ref 0.0–0.1)
Basophils Relative: 0 %
Eosinophils Absolute: 0.1 10*3/uL (ref 0.0–0.5)
Eosinophils Relative: 1 %
HCT: 41.4 % (ref 36.0–46.0)
Hemoglobin: 12.7 g/dL (ref 12.0–15.0)
Immature Granulocytes: 0 %
Lymphocytes Relative: 5 %
Lymphs Abs: 0.5 10*3/uL — ABNORMAL LOW (ref 0.7–4.0)
MCH: 30.2 pg (ref 26.0–34.0)
MCHC: 30.7 g/dL (ref 30.0–36.0)
MCV: 98.6 fL (ref 80.0–100.0)
Monocytes Absolute: 0.3 10*3/uL (ref 0.1–1.0)
Monocytes Relative: 3 %
Neutro Abs: 8.9 10*3/uL — ABNORMAL HIGH (ref 1.7–7.7)
Neutrophils Relative %: 91 %
Platelets: 215 10*3/uL (ref 150–400)
RBC: 4.2 MIL/uL (ref 3.87–5.11)
RDW: 14.3 % (ref 11.5–15.5)
WBC: 9.7 10*3/uL (ref 4.0–10.5)
nRBC: 0 % (ref 0.0–0.2)

## 2021-09-01 LAB — URINALYSIS, ROUTINE W REFLEX MICROSCOPIC
Bilirubin Urine: NEGATIVE
Glucose, UA: NEGATIVE mg/dL
Hgb urine dipstick: NEGATIVE
Ketones, ur: NEGATIVE mg/dL
Leukocytes,Ua: NEGATIVE
Nitrite: NEGATIVE
Protein, ur: NEGATIVE mg/dL
Specific Gravity, Urine: 1.028 (ref 1.005–1.030)
pH: 5 (ref 5.0–8.0)

## 2021-09-01 LAB — COMPREHENSIVE METABOLIC PANEL
ALT: 8 U/L (ref 0–44)
AST: 26 U/L (ref 15–41)
Albumin: 3.8 g/dL (ref 3.5–5.0)
Alkaline Phosphatase: 71 U/L (ref 38–126)
Anion gap: 10 (ref 5–15)
BUN: 16 mg/dL (ref 8–23)
CO2: 23 mmol/L (ref 22–32)
Calcium: 9.3 mg/dL (ref 8.9–10.3)
Chloride: 104 mmol/L (ref 98–111)
Creatinine, Ser: 0.85 mg/dL (ref 0.44–1.00)
GFR, Estimated: >60 mL/min (ref >60)
Glucose, Bld: 124 mg/dL — ABNORMAL HIGH (ref 70–99)
Potassium: 5 mmol/L (ref 3.5–5.1)
Sodium: 137 mmol/L (ref 135–145)
Total Bilirubin: 1.3 mg/dL — ABNORMAL HIGH (ref 0.3–1.2)
Total Protein: 7.5 g/dL (ref 6.5–8.1)

## 2021-09-01 LAB — LACTIC ACID, PLASMA: Lactic Acid, Venous: 1.6 mmol/L (ref 0.5–1.9)

## 2021-09-01 LAB — TROPONIN I (HIGH SENSITIVITY): Troponin I (High Sensitivity): 3 ng/L (ref <18)

## 2021-09-01 MED ORDER — DOXYCYCLINE HYCLATE 100 MG PO TABS
100.0000 mg | ORAL_TABLET | Freq: Once | ORAL | Status: AC
  Administered 2021-09-01: 100 mg via ORAL
  Filled 2021-09-01: qty 1

## 2021-09-01 MED ORDER — OXYCODONE HCL 5 MG PO TABS
5.0000 mg | ORAL_TABLET | Freq: Once | ORAL | Status: AC
  Administered 2021-09-01: 5 mg via ORAL
  Filled 2021-09-01: qty 1

## 2021-09-01 MED ORDER — IOHEXOL 350 MG/ML SOLN
75.0000 mL | Freq: Once | INTRAVENOUS | Status: AC | PRN
  Administered 2021-09-01: 75 mL via INTRAVENOUS
  Filled 2021-09-01: qty 75

## 2021-09-01 MED ORDER — DOXYCYCLINE MONOHYDRATE 100 MG PO TABS: 100.0000 mg | ORAL_TABLET | Freq: Two times a day (BID) | ORAL | 0 refills | Status: AC

## 2021-09-01 MED ORDER — OXYCODONE HCL 5 MG PO TABS: 5.0000 mg | ORAL_TABLET | Freq: Four times a day (QID) | ORAL | 0 refills | Status: AC | PRN

## 2021-09-01 MED ORDER — CEFTRIAXONE SODIUM 2 G IJ SOLR
2.0000 g | Freq: Once | INTRAMUSCULAR | Status: AC
  Administered 2021-09-01: 2 g via INTRAVENOUS
  Filled 2021-09-01: qty 20

## 2021-09-01 NOTE — ED Provider Notes (Addendum)
? ?[facility] ?Provider Note ? ? ? Event Date/Time  ? First MD Initiated Contact with Patient 09/01/21 1117   ?  (approximate) ? ? ?History  ? ?Leg Pain ? ? ?HPI ? ?Denise Watts is a 66 y.o. female with history of skin cancer who comes in with right leg pain.  Patient was seen by myself on 4/30 and she is worried that the cellulitis is not getting much better.  She reports a little bit of discharge coming out of the wound.  She does report putting Vaseline on it and seemed to get better after that.  She denies any fevers.  She reports being compliant with the Keflex.  She is taken 3 days of it so far.  She reports having a follow-up appointment with her dermatologist on next Tuesday.  Patient's oxygen levels 91-92% in triage.  Patient reports not being compliant with her O2 but she supposed to be on 2 L.  I reviewed her records from 06/03/2019 where patient was diagnosed with pulmonary emphysema. ? ?Physical Exam  ? ?Triage Vital Signs: ?ED Triage Vitals  ?Enc Vitals Group  ?   BP 09/01/21 1035 120/89  ?   Pulse Rate 09/01/21 1035 (!) 105  ?   Resp 09/01/21 1035 18  ?   Temp 09/01/21 1035 98.2 ?F (36.8 ?C)  ?   Temp Source 09/01/21 1035 Oral  ?   SpO2 09/01/21 1035 91 %  ?   Weight --   ?   Height --   ?   Head Circumference --   ?   Peak Flow --   ?   Pain Score 09/01/21 1037 10  ?   Pain Loc --   ?   Pain Edu? --   ?   Excl. in [place]? --   ? ? ?Most recent vital signs: ?Vitals:  ? 09/01/21 1035  ?BP: 120/89  ?Pulse: (!) 105  ?Resp: 18  ?Temp: 98.2 ?F (36.8 ?C)  ?SpO2: 91%  ? ? ? ?General: Awake, no distress.  ?CV:  Good peripheral perfusion.  ?Resp:  Normal effort.  ?Abd:  No distention.  ?Other:  Patient has some redness on the tib-fib of the right leg.  Does not extend over the knee or over the ankle.  It is not as warm as it was previously.  No discharge noted from a sutures.  No calf tenderness.  Similar swelling to prior. ? ? ?ED Results / Procedures / Treatments  ? ?Labs ?(all labs ordered  are listed, but only abnormal results are displayed) ?Labs Reviewed  ?COMPREHENSIVE METABOLIC PANEL - Abnormal; Notable for the following components:  ?    Result Value  ? Glucose, Bld 124 (*)   ? Total Bilirubin 1.3 (*)   ? All other components within normal limits  ?CBC WITH DIFFERENTIAL/PLATELET - Abnormal; Notable for the following components:  ? Neutro Abs 8.9 (*)   ? Lymphs Abs 0.5 (*)   ? All other components within normal limits  ?LACTIC ACID, PLASMA  ?URINALYSIS, ROUTINE W REFLEX MICROSCOPIC  ? ? ? ? ?RADIOLOGY ?I have reviewed the xray personally and only evidence of soft tissue swelling but no gas ? ?X-ray with some atelectasis but no evidence of any pneumonia ? ?PROCEDURES: ? ?Critical Care performed: No ? ?Procedures ? ? ?MEDICATIONS ORDERED IN ED: ?Medications  ?oxyCODONE (Oxy IR/ROXICODONE) immediate release tablet 5 mg (5 mg Oral Given 09/01/21 1214)  ?doxycycline (VIBRA-TABS) tablet 100 mg (100 mg  Oral Given 09/01/21 1214)  ? ? ? ?IMPRESSION / MDM / ASSESSMENT AND PLAN / ED COURSE  ?I reviewed the triage vital signs and the nursing notes. ? ?Patient comes in with concern for cellulitis is not getting better.  On my examination given I saw patient previously actually feeling it has improved given is not as warm and red and angry.  She does still have some discoloration but seems to be improved from prior and not extending in the location.  Still does not cross over to the ankle or across into the knee.  I suspect that patient just needs more time with with the antibiotic orally.  We discussed admission for IV antibiotics versus going home and she agrees with trialing some more oral antibiotics at home given it does appear to be responding.  I did add on doxycycline just to cover any MRSA given she reports a little bit of drainage but do not see any active drainage from the wound at this time.  She got good distal pulse and recent ultrasound has been negative for DVT.  We will add on x-ray just to make  sure no evidence of bony infection.  Does not seem to be consistent with necrotizing fasciitis or osteomyelitis.   ? ? ?Patient noted to have slightly low saturation in triage but patient reports that she supposed be on 2 L and has not been using it.  However she does have oxygen at home ? ?Reevaluated patient.  Does now endorse having some increasing shortness of breath over the past few days it seems different than her normal emphysema.  Given her history of breast cancer not currently on any chemotherapy or radiation we discussed CT imaging to rule out PE given slightly tachycardic although when I rechecked her heart rate without any interventions it was in the 90s.  We will also get EKG, troponin to rule out ACS.  Given I am going to get a CT of her chest given she does report some drainage on the leg I will get a CT just to make sure no evidence of abscess or pocket. ? ? ? ? ?EKG my interpretation is normal sinus with T wave version in lead III with normal intervals and no ST elevation ? ?Patient CT scan is negative some incidentally noted nodules which I provided copy of report to family.  I suspect this is just related to her emphysema and patient is already on steroids and going to start her on doxycycline as well which will cover both her lungs and her leg additionally.  She is already has oxygen at home and DuoNebs as needed. ? ?Her CT scan of the leg does not show any abscess just shows signs of cellulitis.  Again I do not believe this represents necrotizing fasciitis.  Given the reassuring CT I think it is reasonable for her to go home.  Troponin is negative therefore unlikely ACS. ? ?I considered admission for patient given she comes back in after being on antibiotics but given I saw her previously and I feel like the cellulitis is getting better and the CT is without any evidence of abscess I think it is reasonable for patient to go home.  Patient also feels comfortable with this plan. ? ? ? ?Patient  handed off to oncoming team pending CT imaging. ? ? ?FINAL CLINICAL IMPRESSION(S) / ED DIAGNOSES  ? ?Final diagnoses:  ?Right leg pain  ?Cellulitis of right leg  ?Cellulitis of right lower extremity  ? ? ? ?  Rx / DC Orders  ? ?ED Discharge Orders   ? ?      Ordered  ?  doxycycline (ADOXA) 100 MG tablet  2 times daily       ? 09/01/21 1315  ?  oxyCODONE (ROXICODONE) 5 MG immediate release tablet  Every 6 hours PRN       ? 09/01/21 1315  ? ?  ?  ? ?  ? ? ? ?Note:  This document was prepared using Dragon voice recognition software and may include unintentional dictation errors. ?  ?Vanessa Sierra, MD ?09/01/21 1316 ? ?  ?Vanessa Loudon, MD ?09/01/21 1449 ? ?

## 2021-09-01 NOTE — Discharge Instructions (Addendum)
Continue taking the Keflex and add the doxycycline and I given some more pain medication.  Return to the ER if you develop worsening redness, pain, fevers or any other concerns otherwise follow-up with your dermatologist as soon as possible. ? ?Negative for significant acute pulmonary embolus by CTA.  ?   ?Thoracic aortic atherosclerosis without acute dissection or  ?aneurysm.  ?   ?Aortic valve calcifications and native coronary atherosclerosis  ?   ?Left apical 5 mm pulmonary nodule  ?   ?Left lower lobe superior segment 14 mm subpleural pulmonary nodule.  ?Consider one of the following in 3 months for both low-risk and  ?high-risk individuals: (a) repeat chest CT, (b) follow-up PET-CT.  ?This recommendation follows the consensus statement: Guidelines for  ?Management of Incidental Pulmonary Nodules Detected on CT Images:  ?From the Fleischner Society 2017; Radiology 2017; 284:228-243.  ? ?IMPRESSION: ?1. Soft tissue edema and ill-defined fluid of the right lower leg, ?nonspecific but may represent cellulitis. No well-defined or ?rim-enhancing fluid collections to suggest abscess. No soft tissue ?gas. ?2. No acute osseous abnormality. ?  ?  ? ?

## 2021-09-01 NOTE — ED Triage Notes (Signed)
Pt comes pov with follow up for leg infection after removal or skin cancer on her right lower leg. Was seen and d/c given PO antiobiotics. States she doesn't think it's getting better. VSS. Wound has some minor redness and swelling. Area that was removed is covered and bandaged at this time.  ?

## 2021-09-01 NOTE — ED Notes (Addendum)
Pt was told to start wearing her oxygen during the day but today she states "I was resting" so she hasn't put it on. 88-91% on RA. Placed on 2L [place].  ?

## 2021-09-02 ENCOUNTER — Telehealth: Payer: Medicare HMO

## 2021-09-02 ENCOUNTER — Telehealth: Payer: Self-pay | Admitting: *Deleted

## 2021-09-02 NOTE — Telephone Encounter (Signed)
?  Care Management  ? ?Follow Up Note ? ? ?09/02/2021 ?Name: Denise Watts MRN: [id number] DOB: 24-May-1955 ? ? ?Referred by: [name], FNP ?Reason for referral : Care Coordination ? ? ?An unsuccessful telephone outreach was attempted today. The patient was referred to the case management team for assistance with care management and care coordination.  ? ?Follow Up Plan: Telephone follow up appointment with care management team member to be re-scheduled by the careguide. ? ? ?Teighan Aubert, LCSW ?Clinical Social Worker  ?[facility] ?[phone] ? ? ?

## 2021-09-06 ENCOUNTER — Other Ambulatory Visit: Payer: Self-pay | Admitting: Oncology

## 2021-09-07 DIAGNOSIS — C44622 Squamous cell carcinoma of skin of right upper limb, including shoulder: Secondary | ICD-10-CM | POA: Diagnosis not present

## 2021-09-07 DIAGNOSIS — C44722 Squamous cell carcinoma of skin of right lower limb, including hip: Secondary | ICD-10-CM | POA: Diagnosis not present

## 2021-09-07 DIAGNOSIS — D2361 Other benign neoplasm of skin of right upper limb, including shoulder: Secondary | ICD-10-CM | POA: Diagnosis not present

## 2021-09-07 DIAGNOSIS — D2371 Other benign neoplasm of skin of right lower limb, including hip: Secondary | ICD-10-CM | POA: Diagnosis not present

## 2021-09-08 ENCOUNTER — Other Ambulatory Visit: Payer: Self-pay | Admitting: Primary Care

## 2021-09-08 DIAGNOSIS — J432 Centrilobular emphysema: Secondary | ICD-10-CM

## 2021-09-08 DIAGNOSIS — R053 Chronic cough: Secondary | ICD-10-CM

## 2021-09-08 NOTE — Telephone Encounter (Signed)
Beth, please advise if you are okay refilling med. 

## 2021-09-09 ENCOUNTER — Inpatient Hospital Stay: Payer: Medicare HMO | Attending: Oncology

## 2021-09-09 VITALS — BP 135/66 | Temp 96.9°F | Resp 18

## 2021-09-09 DIAGNOSIS — E538 Deficiency of other specified B group vitamins: Secondary | ICD-10-CM | POA: Insufficient documentation

## 2021-09-09 DIAGNOSIS — Z23 Encounter for immunization: Secondary | ICD-10-CM

## 2021-09-09 DIAGNOSIS — D509 Iron deficiency anemia, unspecified: Secondary | ICD-10-CM | POA: Insufficient documentation

## 2021-09-09 MED ORDER — SODIUM CHLORIDE 0.9 % IV SOLN: 200.0000 mg | INTRAVENOUS | Status: DC

## 2021-09-09 MED ORDER — IRON SUCROSE 20 MG/ML IV SOLN
200.0000 mg | Freq: Once | INTRAVENOUS | Status: AC
  Administered 2021-09-09: 200 mg via INTRAVENOUS
  Filled 2021-09-09: qty 10

## 2021-09-09 MED ORDER — SODIUM CHLORIDE 0.9 % IV SOLN
Freq: Once | INTRAVENOUS | Status: AC
  Filled 2021-09-09: qty 250

## 2021-09-09 NOTE — Patient Instructions (Signed)
[facility]  Discharge Instructions: ?Thank you for choosing [facility] to provide your oncology and hematology care.  ?If you have a lab appointment with the Cancer Center, please go directly to the Cancer Center and check in at the registration area. ? ?Wear comfortable clothing and clothing appropriate for easy access to any Portacath or PICC line.  ? ?We strive to give you quality time with your provider. You may need to reschedule your appointment if you arrive late (15 or more minutes).  Arriving late affects you and other patients whose appointments are after yours.  Also, if you miss three or more appointments without notifying the office, you may be dismissed from the clinic at the provider?s discretion.    ?  ?For prescription refill requests, have your pharmacy contact our office and allow 72 hours for refills to be completed.   ? ?Today you received the following chemotherapy and/or immunotherapy agents VENOFER ?    ?  ?To help prevent nausea and vomiting after your treatment, we encourage you to take your nausea medication as directed. ? ?BELOW ARE SYMPTOMS THAT SHOULD BE REPORTED IMMEDIATELY: ?*FEVER GREATER THAN 100.4 F (38 ?C) OR HIGHER ?*CHILLS OR SWEATING ?*NAUSEA AND VOMITING THAT IS NOT CONTROLLED WITH YOUR NAUSEA MEDICATION ?*UNUSUAL SHORTNESS OF BREATH ?*UNUSUAL BRUISING OR BLEEDING ?*URINARY PROBLEMS (pain or burning when urinating, or frequent urination) ?*BOWEL PROBLEMS (unusual diarrhea, constipation, pain near the anus) ?TENDERNESS IN MOUTH AND THROAT WITH OR WITHOUT PRESENCE OF ULCERS (sore throat, sores in mouth, or a toothache) ?UNUSUAL RASH, SWELLING OR PAIN  ?UNUSUAL VAGINAL DISCHARGE OR ITCHING  ? ?Items with * indicate a potential emergency and should be followed up as soon as possible or go to the Emergency Department if any problems should occur. ? ?Please show the CHEMOTHERAPY ALERT CARD or IMMUNOTHERAPY ALERT CARD at check-in to  the Emergency Department and triage nurse. ? ?Should you have questions after your visit or need to cancel or reschedule your appointment, please contact [facility]  [phone] and follow the prompts.  Office hours are 8:00 a.m. to 4:30 p.m. Monday - Friday. Please note that voicemails left after 4:00 p.m. may not be returned until the following business day.  We are closed weekends and major holidays. You have access to a nurse at all times for urgent questions. Please call the main number to the clinic [phone] and follow the prompts. ? ?For any non-urgent questions, you may also contact your provider using MyChart. We now offer e-Visits for anyone 18 and older to request care online for non-urgent symptoms. For details visit [email or web address]. ?  ?Also download the MyChart app! Go to the app store, search "MyChart", open the app, select [facility], and log in with your MyChart username and password. ? ?Due to Covid, a mask is required upon entering the hospital/clinic. If you do not have a mask, one will be given to you upon arrival. For doctor visits, patients may have 1 support person aged 18 or older with them. For treatment visits, patients cannot have anyone with them due to current Covid guidelines and our immunocompromised population.  ? ?Iron Sucrose Injection ?What is this medication? ?IRON SUCROSE (EYE ern SOO krose) treats low levels of iron (iron deficiency anemia) in people with kidney disease. Iron is a mineral that plays an important role in making red blood cells, which carry oxygen from your lungs to the rest of your body. ?This medicine   may be used for other purposes; ask your health care provider or pharmacist if you have questions. ?COMMON BRAND NAME(S): Venofer ?What should I tell my care team before I take this medication? ?They need to know if you have any of these conditions: ?Anemia not caused by low iron levels ?Heart disease ?High levels of  iron in the blood ?Kidney disease ?Liver disease ?An unusual or allergic reaction to iron, other medications, foods, dyes, or preservatives ?Pregnant or trying to get pregnant ?Breast-feeding ?How should I use this medication? ?This medication is for infusion into a vein. It is given in a hospital or clinic setting. ?Talk to your care team about the use of this medication in children. While this medication may be prescribed for children as young as 2 years for selected conditions, precautions do apply. ?Overdosage: If you think you have taken too much of this medicine contact a poison control center or emergency room at once. ?NOTE: This medicine is only for you. Do not share this medicine with others. ?What if I miss a dose? ?It is important not to miss your dose. Call your care team if you are unable to keep an appointment. ?What may interact with this medication? ?Do not take this medication with any of the following: ?Deferoxamine ?Dimercaprol ?Other iron products ?This medication may also interact with the following: ?Chloramphenicol ?Deferasirox ?This list may not describe all possible interactions. Give your health care provider a list of all the medicines, herbs, non-prescription drugs, or dietary supplements you use. Also tell them if you smoke, drink alcohol, or use illegal drugs. Some items may interact with your medicine. ?What should I watch for while using this medication? ?Visit your care team regularly. Tell your care team if your symptoms do not start to get better or if they get worse. You may need blood work done while you are taking this medication. ?You may need to follow a special diet. Talk to your care team. Foods that contain iron include: whole grains/cereals, dried fruits, beans, or peas, leafy green vegetables, and organ meats (liver, kidney). ?What side effects may I notice from receiving this medication? ?Side effects that you should report to your care team as soon as  possible: ?Allergic reactions--skin rash, itching, hives, swelling of the face, lips, tongue, or throat ?Low blood pressure--dizziness, feeling faint or lightheaded, blurry vision ?Shortness of breath ?Side effects that usually do not require medical attention (report to your care team if they continue or are bothersome): ?Flushing ?Headache ?Joint pain ?Muscle pain ?Nausea ?Pain, redness, or irritation at injection site ?This list may not describe all possible side effects. Call your doctor for medical advice about side effects. You may report side effects to FDA at 1-800-FDA-1088. ?Where should I keep my medication? ?This medication is given in a hospital or clinic and will not be stored at home. ?NOTE: This sheet is a summary. It may not cover all possible information. If you have questions about this medicine, talk to your doctor, pharmacist, or health care provider. ?? 2023 Elsevier/Gold Standard (2020-09-11 00:00:00) ? ?

## 2021-09-10 ENCOUNTER — Ambulatory Visit: Payer: Medicare HMO | Admitting: Plastic Surgery

## 2021-09-14 ENCOUNTER — Inpatient Hospital Stay: Payer: Medicare HMO

## 2021-09-14 VITALS — BP 109/59 | HR 89 | Temp 96.6°F | Resp 18

## 2021-09-14 DIAGNOSIS — E538 Deficiency of other specified B group vitamins: Secondary | ICD-10-CM | POA: Diagnosis not present

## 2021-09-14 DIAGNOSIS — L570 Actinic keratosis: Secondary | ICD-10-CM | POA: Diagnosis not present

## 2021-09-14 DIAGNOSIS — D509 Iron deficiency anemia, unspecified: Secondary | ICD-10-CM | POA: Diagnosis not present

## 2021-09-14 DIAGNOSIS — D2261 Melanocytic nevi of right upper limb, including shoulder: Secondary | ICD-10-CM | POA: Diagnosis not present

## 2021-09-14 DIAGNOSIS — D0462 Carcinoma in situ of skin of left upper limb, including shoulder: Secondary | ICD-10-CM | POA: Diagnosis not present

## 2021-09-14 DIAGNOSIS — Z23 Encounter for immunization: Secondary | ICD-10-CM

## 2021-09-14 DIAGNOSIS — D2361 Other benign neoplasm of skin of right upper limb, including shoulder: Secondary | ICD-10-CM | POA: Diagnosis not present

## 2021-09-14 MED ORDER — IRON SUCROSE 20 MG/ML IV SOLN
200.0000 mg | Freq: Once | INTRAVENOUS | Status: AC
  Administered 2021-09-14: 200 mg via INTRAVENOUS
  Filled 2021-09-14: qty 10

## 2021-09-14 MED ORDER — SODIUM CHLORIDE 0.9 % IV SOLN
Freq: Once | INTRAVENOUS | Status: DC
  Filled 2021-09-14: qty 250

## 2021-09-14 MED ORDER — SODIUM CHLORIDE 0.9 % IV SOLN: 200.0000 mg | INTRAVENOUS | Status: DC

## 2021-09-14 NOTE — Patient Instructions (Signed)
[facility]  Discharge Instructions: ?Thank you for choosing [facility] to provide your oncology and hematology care.  ?If you have a lab appointment with the Cancer Center, please go directly to the Cancer Center and check in at the registration area. ? ?Wear comfortable clothing and clothing appropriate for easy access to any Portacath or PICC line.  ? ?We strive to give you quality time with your provider. You may need to reschedule your appointment if you arrive late (15 or more minutes).  Arriving late affects you and other patients whose appointments are after yours.  Also, if you miss three or more appointments without notifying the office, you may be dismissed from the clinic at the provider?s discretion.    ?  ?For prescription refill requests, have your pharmacy contact our office and allow 72 hours for refills to be completed.   ? ?Today you received the following chemotherapy and/or immunotherapy agents VENOFER ?    ?  ?To help prevent nausea and vomiting after your treatment, we encourage you to take your nausea medication as directed. ? ?BELOW ARE SYMPTOMS THAT SHOULD BE REPORTED IMMEDIATELY: ?*FEVER GREATER THAN 100.4 F (38 ?C) OR HIGHER ?*CHILLS OR SWEATING ?*NAUSEA AND VOMITING THAT IS NOT CONTROLLED WITH YOUR NAUSEA MEDICATION ?*UNUSUAL SHORTNESS OF BREATH ?*UNUSUAL BRUISING OR BLEEDING ?*URINARY PROBLEMS (pain or burning when urinating, or frequent urination) ?*BOWEL PROBLEMS (unusual diarrhea, constipation, pain near the anus) ?TENDERNESS IN MOUTH AND THROAT WITH OR WITHOUT PRESENCE OF ULCERS (sore throat, sores in mouth, or a toothache) ?UNUSUAL RASH, SWELLING OR PAIN  ?UNUSUAL VAGINAL DISCHARGE OR ITCHING  ? ?Items with * indicate a potential emergency and should be followed up as soon as possible or go to the Emergency Department if any problems should occur. ? ?Please show the CHEMOTHERAPY ALERT CARD or IMMUNOTHERAPY ALERT CARD at check-in to  the Emergency Department and triage nurse. ? ?Should you have questions after your visit or need to cancel or reschedule your appointment, please contact [facility]  [phone] and follow the prompts.  Office hours are 8:00 a.m. to 4:30 p.m. Monday - Friday. Please note that voicemails left after 4:00 p.m. may not be returned until the following business day.  We are closed weekends and major holidays. You have access to a nurse at all times for urgent questions. Please call the main number to the clinic [phone] and follow the prompts. ? ?For any non-urgent questions, you may also contact your provider using MyChart. We now offer e-Visits for anyone 18 and older to request care online for non-urgent symptoms. For details visit [email or web address]. ?  ?Also download the MyChart app! Go to the app store, search "MyChart", open the app, select [facility], and log in with your MyChart username and password. ? ?Due to Covid, a mask is required upon entering the hospital/clinic. If you do not have a mask, one will be given to you upon arrival. For doctor visits, patients may have 1 support person aged 18 or older with them. For treatment visits, patients cannot have anyone with them due to current Covid guidelines and our immunocompromised population.  ? ?Iron Sucrose Injection ?What is this medication? ?IRON SUCROSE (EYE ern SOO krose) treats low levels of iron (iron deficiency anemia) in people with kidney disease. Iron is a mineral that plays an important role in making red blood cells, which carry oxygen from your lungs to the rest of your body. ?This medicine   may be used for other purposes; ask your health care provider or pharmacist if you have questions. ?COMMON BRAND NAME(S): Venofer ?What should I tell my care team before I take this medication? ?They need to know if you have any of these conditions: ?Anemia not caused by low iron levels ?Heart disease ?High levels of  iron in the blood ?Kidney disease ?Liver disease ?An unusual or allergic reaction to iron, other medications, foods, dyes, or preservatives ?Pregnant or trying to get pregnant ?Breast-feeding ?How should I use this medication? ?This medication is for infusion into a vein. It is given in a hospital or clinic setting. ?Talk to your care team about the use of this medication in children. While this medication may be prescribed for children as young as 2 years for selected conditions, precautions do apply. ?Overdosage: If you think you have taken too much of this medicine contact a poison control center or emergency room at once. ?NOTE: This medicine is only for you. Do not share this medicine with others. ?What if I miss a dose? ?It is important not to miss your dose. Call your care team if you are unable to keep an appointment. ?What may interact with this medication? ?Do not take this medication with any of the following: ?Deferoxamine ?Dimercaprol ?Other iron products ?This medication may also interact with the following: ?Chloramphenicol ?Deferasirox ?This list may not describe all possible interactions. Give your health care provider a list of all the medicines, herbs, non-prescription drugs, or dietary supplements you use. Also tell them if you smoke, drink alcohol, or use illegal drugs. Some items may interact with your medicine. ?What should I watch for while using this medication? ?Visit your care team regularly. Tell your care team if your symptoms do not start to get better or if they get worse. You may need blood work done while you are taking this medication. ?You may need to follow a special diet. Talk to your care team. Foods that contain iron include: whole grains/cereals, dried fruits, beans, or peas, leafy green vegetables, and organ meats (liver, kidney). ?What side effects may I notice from receiving this medication? ?Side effects that you should report to your care team as soon as  possible: ?Allergic reactions--skin rash, itching, hives, swelling of the face, lips, tongue, or throat ?Low blood pressure--dizziness, feeling faint or lightheaded, blurry vision ?Shortness of breath ?Side effects that usually do not require medical attention (report to your care team if they continue or are bothersome): ?Flushing ?Headache ?Joint pain ?Muscle pain ?Nausea ?Pain, redness, or irritation at injection site ?This list may not describe all possible side effects. Call your doctor for medical advice about side effects. You may report side effects to FDA at 1-800-FDA-1088. ?Where should I keep my medication? ?This medication is given in a hospital or clinic and will not be stored at home. ?NOTE: This sheet is a summary. It may not cover all possible information. If you have questions about this medicine, talk to your doctor, pharmacist, or health care provider. ?? 2023 Elsevier/Gold Standard (2020-09-11 00:00:00) ? ?

## 2021-09-16 ENCOUNTER — Inpatient Hospital Stay: Payer: Medicare HMO

## 2021-09-16 VITALS — BP 95/55 | HR 95 | Temp 97.0°F | Resp 17

## 2021-09-16 DIAGNOSIS — E538 Deficiency of other specified B group vitamins: Secondary | ICD-10-CM

## 2021-09-16 DIAGNOSIS — Z23 Encounter for immunization: Secondary | ICD-10-CM

## 2021-09-16 DIAGNOSIS — D509 Iron deficiency anemia, unspecified: Secondary | ICD-10-CM | POA: Diagnosis not present

## 2021-09-16 MED ORDER — IRON SUCROSE 20 MG/ML IV SOLN
200.0000 mg | Freq: Once | INTRAVENOUS | Status: AC
  Administered 2021-09-16: 200 mg via INTRAVENOUS
  Filled 2021-09-16: qty 10

## 2021-09-16 MED ORDER — SODIUM CHLORIDE 0.9 % IV SOLN: 200.0000 mg | INTRAVENOUS | Status: DC

## 2021-09-16 MED ORDER — SODIUM CHLORIDE 0.9 % IV SOLN
Freq: Once | INTRAVENOUS | Status: AC
  Filled 2021-09-16: qty 250

## 2021-09-16 NOTE — Patient Instructions (Signed)

## 2021-09-21 ENCOUNTER — Inpatient Hospital Stay: Payer: Medicare HMO

## 2021-09-21 VITALS — BP 97/63 | HR 94 | Temp 99.6°F

## 2021-09-21 DIAGNOSIS — D509 Iron deficiency anemia, unspecified: Secondary | ICD-10-CM | POA: Diagnosis not present

## 2021-09-21 DIAGNOSIS — Z23 Encounter for immunization: Secondary | ICD-10-CM

## 2021-09-21 DIAGNOSIS — E538 Deficiency of other specified B group vitamins: Secondary | ICD-10-CM | POA: Diagnosis not present

## 2021-09-21 MED ORDER — SODIUM CHLORIDE 0.9 % IV SOLN
Freq: Once | INTRAVENOUS | Status: AC
  Filled 2021-09-21: qty 250

## 2021-09-21 MED ORDER — IRON SUCROSE 20 MG/ML IV SOLN
200.0000 mg | Freq: Once | INTRAVENOUS | Status: AC
  Administered 2021-09-21: 200 mg via INTRAVENOUS
  Filled 2021-09-21: qty 10

## 2021-09-21 MED ORDER — SODIUM CHLORIDE 0.9 % IV SOLN: 200.0000 mg | INTRAVENOUS | Status: DC

## 2021-09-21 NOTE — Patient Instructions (Signed)

## 2021-09-22 DIAGNOSIS — U071 COVID-19: Secondary | ICD-10-CM | POA: Diagnosis not present

## 2021-09-23 ENCOUNTER — Inpatient Hospital Stay: Payer: Medicare HMO

## 2021-09-23 ENCOUNTER — Telehealth: Payer: Medicare HMO | Admitting: Physician Assistant

## 2021-09-23 VITALS — BP 128/92 | HR 91 | Temp 97.5°F | Resp 18

## 2021-09-23 DIAGNOSIS — E538 Deficiency of other specified B group vitamins: Secondary | ICD-10-CM

## 2021-09-23 DIAGNOSIS — Z23 Encounter for immunization: Secondary | ICD-10-CM

## 2021-09-23 DIAGNOSIS — D509 Iron deficiency anemia, unspecified: Secondary | ICD-10-CM | POA: Diagnosis not present

## 2021-09-23 MED ORDER — SODIUM CHLORIDE 0.9 % IV SOLN
Freq: Once | INTRAVENOUS | Status: AC
  Filled 2021-09-23: qty 250

## 2021-09-23 MED ORDER — IRON SUCROSE 20 MG/ML IV SOLN
200.0000 mg | Freq: Once | INTRAVENOUS | Status: AC
  Administered 2021-09-23: 200 mg via INTRAVENOUS
  Filled 2021-09-23: qty 10

## 2021-09-23 MED ORDER — SODIUM CHLORIDE 0.9 % IV SOLN: 200.0000 mg | INTRAVENOUS | Status: DC

## 2021-09-23 NOTE — Progress Notes (Signed)
Patient tolerated Venofer infusion well, no questions/concerns voiced. Patient stable at discharge. AVS given.   ?

## 2021-09-23 NOTE — Progress Notes (Signed)
The patient no-showed for appointment despite this provider sending direct link, reaching out via phone with no response and waiting for at least 10 minutes from appointment time for patient to join. They will be marked as a NS for this appointment/time.  ? ?Siri Buege Cody Anasha Perfecto, PA-C ? ? ? ?

## 2021-09-23 NOTE — Patient Instructions (Signed)

## 2021-09-29 ENCOUNTER — Inpatient Hospital Stay: Payer: Medicare HMO

## 2021-09-29 DIAGNOSIS — Z23 Encounter for immunization: Secondary | ICD-10-CM

## 2021-09-29 DIAGNOSIS — E538 Deficiency of other specified B group vitamins: Secondary | ICD-10-CM

## 2021-09-29 DIAGNOSIS — D509 Iron deficiency anemia, unspecified: Secondary | ICD-10-CM | POA: Diagnosis not present

## 2021-09-29 MED ORDER — CYANOCOBALAMIN 1000 MCG/ML IJ SOLN
1000.0000 ug | INTRAMUSCULAR | Status: DC
  Administered 2021-09-29: 1000 ug via INTRAMUSCULAR
  Filled 2021-09-29: qty 1

## 2021-09-30 ENCOUNTER — Other Ambulatory Visit: Payer: Self-pay

## 2021-09-30 MED ORDER — ATORVASTATIN CALCIUM 40 MG PO TABS: 40.0000 mg | ORAL_TABLET | Freq: Every day | ORAL | 0 refills | Status: DC

## 2021-10-01 ENCOUNTER — Encounter: Payer: Self-pay | Admitting: Physician Assistant

## 2021-10-01 ENCOUNTER — Other Ambulatory Visit (HOSPITAL_COMMUNITY)
Admission: RE | Admit: 2021-10-01 | Discharge: 2021-10-01 | Disposition: A | Discharge status: Home / Self Care | Payer: Medicare HMO | Source: Ambulatory Visit | Attending: Physician Assistant | Admitting: Physician Assistant

## 2021-10-01 ENCOUNTER — Ambulatory Visit (INDEPENDENT_AMBULATORY_CARE_PROVIDER_SITE_OTHER): Payer: Medicare HMO | Admitting: Physician Assistant

## 2021-10-01 VITALS — BP 120/69 | HR 101 | Temp 98.4°F | Resp 16 | Wt 136.8 lb

## 2021-10-01 DIAGNOSIS — R300 Dysuria: Secondary | ICD-10-CM

## 2021-10-01 DIAGNOSIS — J029 Acute pharyngitis, unspecified: Secondary | ICD-10-CM

## 2021-10-01 LAB — POCT URINALYSIS DIPSTICK
Bilirubin, UA: NEGATIVE
Blood, UA: NEGATIVE
Glucose, UA: POSITIVE — AB
Ketones, UA: NEGATIVE
Leukocytes, UA: NEGATIVE
Nitrite, UA: NEGATIVE
Protein, UA: NEGATIVE
Spec Grav, UA: 1.015 (ref 1.010–1.025)
Urobilinogen, UA: 0.2 E.U./dL
pH, UA: 7 (ref 5.0–8.0)

## 2021-10-01 LAB — POCT RAPID STREP A (OFFICE): Rapid Strep A Screen: NEGATIVE

## 2021-10-01 MED ORDER — FLUCONAZOLE 150 MG PO TABS: 150.0000 mg | ORAL_TABLET | Freq: Once | ORAL | 2 refills | Status: AC

## 2021-10-01 NOTE — Telephone Encounter (Signed)
Scheduled office visit with Mardene Speak today at 3 pm. Patient agreed.

## 2021-10-01 NOTE — Progress Notes (Signed)
Established patient visit   Patient: Denise Watts   DOB: [birth year]   66 y.o. Female  MRN: [id number] Visit Date: 10/01/2021  Today's healthcare provider: Mardene Speak, PA-C   Chief Complaint  Patient presents with   Sore Throat    Possible yeast inf Possible UTI    Subjective    Patient presents for sore throat, painful mouth and tongue x 1-2 weeks.  No exposure to strep to her knowledge.  Also c/o vaginal itching and discharge x 1 week. States she is taking Rx but does not remember what or who prescribed.  States it is not helping.   Also c/o burning and frequency with urination x 2 days.  Has not taken anything for it.    Medications: Outpatient Medications Prior to Visit  Medication Sig   albuterol (VENTOLIN HFA) 108 (90 Base) MCG/ACT inhaler Inhale 2 puffs into the lungs every 6 (six) hours as needed for shortness of breath.   aspirin EC 81 MG tablet Take 1 tablet (81 mg total) by mouth daily. Swallow whole.   atorvastatin (LIPITOR) 40 MG tablet Take 1 tablet (40 mg total) by mouth daily.   benzonatate (TESSALON) 100 MG capsule TAKE 2 CAPSULES (200 MG TOTAL) BY MOUTH 3 TIMES DAILY   brompheniramine-pseudoephedrine-DM 30-2-10 MG/5ML syrup Take 5 mLs by mouth 4 (four) times daily as needed.   Budeson-Glycopyrrol-Formoterol (BREZTRI AEROSPHERE) 160-9-4.8 MCG/ACT AERO Inhale 2 puffs into the lungs in the morning and at bedtime.   buPROPion (WELLBUTRIN) 75 MG tablet Take 75 mg by mouth 2 (two) times daily.   busPIRone (BUSPAR) 5 MG tablet Take 1 tablet (5 mg total) by mouth 2 (two) times daily.   cyclobenzaprine (FLEXERIL) 5 MG tablet TAKE 1 TABLET BY MOUTH 3 TIMES DAILY AS NEEDED FOR MUSCLE SPASMS   DULoxetine (CYMBALTA) 60 MG capsule Take 1 capsule (60 mg total) by mouth daily.   fluticasone (FLONASE) 50 MCG/ACT nasal spray Place 1 spray into both nostrils at bedtime.   gabapentin (NEURONTIN) 300 MG capsule Take 1 capsule (300 mg total) by mouth 3 (three) times daily.    montelukast (SINGULAIR) 10 MG tablet Take 1 tablet (10 mg total) by mouth at bedtime.   omeprazole (PRILOSEC) 40 MG capsule Take 40 mg by mouth in the morning and at bedtime.   oxybutynin (DITROPAN XL) 15 MG 24 hr tablet Take 1 tablet (15 mg total) by mouth daily.   rOPINIRole (REQUIP) 0.25 MG tablet TAKE 1 TO 2 TABLETS BY MOUTH AT BEDTIME FOR RESTLESS LEGS.   predniSONE (DELTASONE) 20 MG tablet Take 1 tablet (20 mg total) by mouth daily with breakfast.   Facility-Administered Medications Prior to Visit  Medication Dose Route Frequency Provider   cyanocobalamin ((VITAMIN B-12)) injection 1,000 mcg  1,000 mcg Intramuscular Q30 days Sindy Guadeloupe, MD   cyanocobalamin ((VITAMIN B-12)) injection 1,000 mcg  1,000 mcg Intramuscular Q30 days Sindy Guadeloupe, MD    Review of Systems  Respiratory: Negative.    Cardiovascular: Negative.   Gastrointestinal: Negative.       Objective    BP 120/69 (BP Location: Left Arm, Patient Position: Sitting, Cuff Size: Normal)   Pulse (!) 101   Temp 98.4 F (36.9 C) (Oral)   Resp 16   Wt 136 lb 12.8 oz (62.1 kg)   SpO2 95%   BMI 25.85 kg/m    Physical Exam Vitals reviewed.  Constitutional:      General: She is not in acute distress.  Appearance: Normal appearance. She is well-developed. She is not diaphoretic.  HENT:     Head: Normocephalic and atraumatic.     Mouth/Throat:     Comments: Tender sores / the oral mucosa and tongue Presence of scattered/spitted poligrip all over her mouth.  Eyes:     General: No scleral icterus.    Conjunctiva/sclera: Conjunctivae normal.  Neck:     Thyroid: No thyromegaly.  Cardiovascular:     Rate and Rhythm: Normal rate and regular rhythm.     Pulses: Normal pulses.     Heart sounds: Normal heart sounds. No murmur heard. Pulmonary:     Effort: Pulmonary effort is normal. No respiratory distress.     Breath sounds: Normal breath sounds. No wheezing, rhonchi or rales.  Genitourinary:    Vagina: No  vaginal discharge.     Comments: Erythematous macerated area covered perineum, vagina, vulva,  pruritic and tender.  Musculoskeletal:     Cervical back: Neck supple.     Right lower leg: No edema.     Left lower leg: No edema.  Lymphadenopathy:     Cervical: No cervical adenopathy.  Skin:    General: Skin is warm and dry.     Findings: No rash.  Neurological:     Mental Status: She is alert and oriented to person, place, and time. Mental status is at baseline.  Psychiatric:        Mood and Affect: Mood normal.        Behavior: Behavior normal.    No results found for any visits on 10/01/21.  Assessment & Plan     1. Sore throat Suspected stomatitis, yeast infection Suggested oral rinse from baking soda/salt Might need to Rx oral rinse - POCT rapid strep A negative - fluconazole (DIFLUCAN) 150 MG tablet; Take 1 tablet (150 mg total) by mouth once for 1 dose.  Dispense: 1 tablet; Refill: 2 FU next week  2. Dysuria Recommended to use top steroids and monistat before culture results - POCT Urinalysis Dipstick negative - fluconazole (DIFLUCAN) 150 MG tablet; Take 1 tablet (150 mg total) by mouth once for 1 dose.  Dispense: 1 tablet; Refill: 2 - Cervicovaginal ancillary only for candida and BV - Urine Culture - Urine Microscopic  FU next week    The patient was advised to call back or seek an in-person evaluation if the symptoms worsen or if the condition fails to improve as anticipated.  I discussed the assessment and treatment plan with the patient. The patient was provided an opportunity to ask questions and all were answered. The patient agreed with the plan and demonstrated an understanding of the instructions.  The entirety of the information documented in the History of Present Illness, Review of Systems and Physical Exam were personally obtained by me. Portions of this information were initially documented by the CMA and reviewed by me for thoroughness and accuracy.   Portions of this note were created using dictation software and may contain typographical errors.    Mardene Speak, PA-C  [facility] [phone] (phone) [phone] (fax)  [place]

## 2021-10-04 DIAGNOSIS — R300 Dysuria: Secondary | ICD-10-CM | POA: Diagnosis not present

## 2021-10-05 ENCOUNTER — Inpatient Hospital Stay: Payer: Medicare HMO | Attending: Oncology

## 2021-10-05 DIAGNOSIS — Z23 Encounter for immunization: Secondary | ICD-10-CM

## 2021-10-05 DIAGNOSIS — E538 Deficiency of other specified B group vitamins: Secondary | ICD-10-CM | POA: Diagnosis not present

## 2021-10-05 MED ORDER — CYANOCOBALAMIN 1000 MCG/ML IJ SOLN
1000.0000 ug | INTRAMUSCULAR | Status: DC
  Administered 2021-10-05: 1000 ug via INTRAMUSCULAR
  Filled 2021-10-05: qty 1

## 2021-10-06 ENCOUNTER — Inpatient Hospital Stay: Payer: Medicare HMO

## 2021-10-06 LAB — CERVICOVAGINAL ANCILLARY ONLY
Bacterial Vaginitis (gardnerella): NEGATIVE
Candida Glabrata: NEGATIVE
Candida Vaginitis: POSITIVE — AB
Comment: NEGATIVE
Comment: NEGATIVE
Comment: NEGATIVE

## 2021-10-07 ENCOUNTER — Ambulatory Visit (INDEPENDENT_AMBULATORY_CARE_PROVIDER_SITE_OTHER): Payer: Medicare HMO | Admitting: Physician Assistant

## 2021-10-07 ENCOUNTER — Encounter: Payer: Self-pay | Admitting: Physician Assistant

## 2021-10-07 VITALS — BP 121/69 | HR 94 | Temp 97.6°F | Resp 14 | Wt 137.4 lb

## 2021-10-07 DIAGNOSIS — B379 Candidiasis, unspecified: Secondary | ICD-10-CM | POA: Diagnosis not present

## 2021-10-07 DIAGNOSIS — R682 Dry mouth, unspecified: Secondary | ICD-10-CM

## 2021-10-07 DIAGNOSIS — R0600 Dyspnea, unspecified: Secondary | ICD-10-CM

## 2021-10-07 DIAGNOSIS — R300 Dysuria: Secondary | ICD-10-CM | POA: Diagnosis not present

## 2021-10-07 LAB — URINE CULTURE

## 2021-10-07 LAB — URINALYSIS, MICROSCOPIC ONLY
Bacteria, UA: NONE SEEN
Casts: NONE SEEN /lpf
WBC, UA: NONE SEEN /hpf (ref 0–5)

## 2021-10-07 MED ORDER — CEPHALEXIN 500 MG PO CAPS: 500.0000 mg | ORAL_CAPSULE | Freq: Two times a day (BID) | ORAL | 0 refills | Status: DC

## 2021-10-07 MED ORDER — FLUCONAZOLE 150 MG PO TABS: 150.0000 mg | ORAL_TABLET | Freq: Once | ORAL | 2 refills | Status: AC

## 2021-10-07 NOTE — Progress Notes (Signed)
I,Jana Lynore Coscia,acting as a Education administrator for [employer], PA-C.,have documented all relevant documentation on the behalf of Mardene Speak, PA-C,as directed by  [employer], PA-C while in the presence of [employer], PA-C.    Established patient visit   Patient: Denise Watts   DOB: Feb 12, 1956   66 y.o. Female  MRN: [id number] Visit Date: 10/07/2021  Today's healthcare provider: Mardene Speak, PA-C   Chief Complaint  Patient presents with  . Vaginal Itching    Sore throat / mouth    Subjective    Follow up for sore throat/mouth  The patient was last seen for this 6 days ago. Changes made at last visit include Suspected stomatitis Suggested oral rinse from baking soda/salt. Doing twice daily. Might need to Rx oral rinse  rapid strep A negative. No important.   She reports good compliance with treatment. She feels that condition is Unchanged. She is not having side effects.   ----------------------------------------------------------------------------------------- Follow up for dysuria/vaginal itching  The patient was last seen for this 6 days ago. Changes made at last visit include: Recommended to use top steroids and monistat before culture results: positive for Candida vaginitis / Fluconazole 150 MG 1 dose.   Urinalysis Dipstick negative  She reports good compliance with treatment. She feels that condition is Unchanged. She is not having side effects. Reports some itching and continued discharge.   -----------------------------------------------------------------------------------------  Medications: Outpatient Medications Prior to Visit  Medication Sig  . albuterol (VENTOLIN HFA) 108 (90 Base) MCG/ACT inhaler Inhale 2 puffs into the lungs every 6 (six) hours as needed for shortness of breath.  Marland Kitchen aspirin EC 81 MG tablet Take 1 tablet (81 mg total) by mouth daily. Swallow whole.  Marland Kitchen atorvastatin (LIPITOR) 40 MG tablet Take 1 tablet (40 mg total) by mouth daily.  .  benzonatate (TESSALON) 100 MG capsule TAKE 2 CAPSULES (200 MG TOTAL) BY MOUTH 3 TIMES DAILY  . brompheniramine-pseudoephedrine-DM 30-2-10 MG/5ML syrup Take 5 mLs by mouth 4 (four) times daily as needed.  . Budeson-Glycopyrrol-Formoterol (BREZTRI AEROSPHERE) 160-9-4.8 MCG/ACT AERO Inhale 2 puffs into the lungs in the morning and at bedtime.  Marland Kitchen buPROPion (WELLBUTRIN) 75 MG tablet Take 75 mg by mouth 2 (two) times daily.  . busPIRone (BUSPAR) 5 MG tablet Take 1 tablet (5 mg total) by mouth 2 (two) times daily.  . cyclobenzaprine (FLEXERIL) 5 MG tablet TAKE 1 TABLET BY MOUTH 3 TIMES DAILY AS NEEDED FOR MUSCLE SPASMS  . DULoxetine (CYMBALTA) 60 MG capsule Take 1 capsule (60 mg total) by mouth daily.  . fluticasone (FLONASE) 50 MCG/ACT nasal spray Place 1 spray into both nostrils at bedtime.  . gabapentin (NEURONTIN) 300 MG capsule Take 1 capsule (300 mg total) by mouth 3 (three) times daily.  . montelukast (SINGULAIR) 10 MG tablet Take 1 tablet (10 mg total) by mouth at bedtime.  Marland Kitchen omeprazole (PRILOSEC) 40 MG capsule Take 40 mg by mouth in the morning and at bedtime.  Marland Kitchen oxybutynin (DITROPAN XL) 15 MG 24 hr tablet Take 1 tablet (15 mg total) by mouth daily.  Marland Kitchen rOPINIRole (REQUIP) 0.25 MG tablet TAKE 1 TO 2 TABLETS BY MOUTH AT BEDTIME FOR RESTLESS LEGS.   Facility-Administered Medications Prior to Visit  Medication Dose Route Frequency Provider  . cyanocobalamin ((VITAMIN B-12)) injection 1,000 mcg  1,000 mcg Intramuscular Q30 days Sindy Guadeloupe, MD  . cyanocobalamin ((VITAMIN B-12)) injection 1,000 mcg  1,000 mcg Intramuscular Q30 days Sindy Guadeloupe, MD    Review of Systems  {  Labs  Heme  Chem  Endocrine  Serology  Results Review (optional):23779}   Objective    BP 121/69 (BP Location: Right Arm, Patient Position: Sitting, Cuff Size: Normal)   Pulse 94   Temp 97.6 F (36.4 C) (Oral)   Resp 14   Wt 137 lb 6.4 oz (62.3 kg)   SpO2 95%   BMI 25.96 kg/m  {Show previous vital signs  (optional):23777}  Physical Exam Vitals reviewed.  Constitutional:      General: She is not in acute distress.    Appearance: Normal appearance. She is well-developed. She is not diaphoretic.  HENT:     Head: Normocephalic and atraumatic.     Right Ear: Tympanic membrane, ear canal and external ear normal.     Left Ear: Tympanic membrane, ear canal and external ear normal.     Nose: Nose normal.     Mouth/Throat:     Mouth: Mucous membranes are moist.     Pharynx: Oropharynx is clear. No oropharyngeal exudate.  Eyes:     General: No scleral icterus.    Conjunctiva/sclera: Conjunctivae normal.     Pupils: Pupils are equal, round, and reactive to light.  Neck:     Thyroid: No thyromegaly.  Cardiovascular:     Rate and Rhythm: Normal rate and regular rhythm.     Pulses: Normal pulses.     Heart sounds: Normal heart sounds. No murmur heard. Pulmonary:     Effort: Pulmonary effort is normal. No respiratory distress.     Breath sounds: Normal breath sounds. No wheezing or rales.  Abdominal:     General: There is no distension.     Palpations: Abdomen is soft.     Tenderness: There is no abdominal tenderness.  Musculoskeletal:        General: No deformity.     Cervical back: Neck supple.     Right lower leg: No edema.     Left lower leg: No edema.  Lymphadenopathy:     Cervical: No cervical adenopathy.  Skin:    General: Skin is warm and dry.     Findings: No rash.  Neurological:     Mental Status: She is alert and oriented to person, place, and time. Mental status is at baseline.     Sensory: No sensory deficit.     Motor: No weakness.     Gait: Gait normal.  Psychiatric:        Mood and Affect: Mood normal.        Behavior: Behavior normal.        Thought Content: Thought content normal.    ***  No results found for any visits on 10/07/21.  Assessment & Plan     1. Dysuria Urine culture was positive for Proteus M.  - cephALEXin (KEFLEX) 500 MG capsule; Take 1  capsule (500 mg total) by mouth 2 (two) times daily.  Dispense: 14 capsule; Refill: 0 Will recheck in 7 days if symptoms persist Advised to schedule a video visit as it has been difficult for her/ she has been taking a care for her husband. Continue monostat for 7 days and diflucan - Q 72 hours up to 3  2. Dyspnea, unspecified type Abnormal Lung exam -Bretzri sample was provided Might need to see pulmonology if patient agrees  3. Mouth dryness Suspected due to multidrug therapy  4. Ear pain Hydrogen peroxide - ear drops Pt has been having financial burden  The patient was advised to call back or  seek an in-person evaluation if the symptoms worsen or if the condition fails to improve as anticipated.  I discussed the assessment and treatment plan with the patient. The patient was provided an opportunity to ask questions and all were answered. The patient agreed with the plan and demonstrated an understanding of the instructions.  The entirety of the information documented in the History of Present Illness, Review of Systems and Physical Exam were personally obtained by me. Portions of this information were initially documented by the CMA and reviewed by me for thoroughness and accuracy.  Portions of this note were created using dictation software and may contain typographical errors.       {provider attestation***:1}   Mardene Speak, Hershal Coria  [facility] [phone] (phone) [phone] (fax)  [place]

## 2021-10-08 ENCOUNTER — Telehealth: Payer: Self-pay | Admitting: *Deleted

## 2021-10-08 NOTE — Chronic Care Management (AMB) (Signed)
  Chronic Care Management   Note  10/08/2021 Name: Denise Watts MRN: [id number] DOB: 11-19-1955  Denise Watts is a 66 y.o. year old female who is a primary care patient of [name], FNP. I reached out to Denise Watts by phone today in response to a referral sent by [name].  [name] was given information about Chronic Care Management services today including:  CCM service includes personalized support from designated clinical staff supervised by her physician, including individualized plan of care and coordination with other care providers 24/7 contact phone numbers for assistance for urgent and routine care needs. Service will only be billed when office clinical staff spend 20 minutes or more in a month to coordinate care. Only one practitioner may furnish and bill the service in a calendar month. The patient may stop CCM services at any time (effective at the end of the month) by phone call to the office staff. The patient is responsible for co-pay (up to 20% after annual deductible is met) if co-pay is required by the individual health plan.   Patient agreed to services and verbal consent obtained.   Follow up plan: Telephone appointment with care management team member scheduled for: 10/12/2021  Julian Hy, [place] [facility]  Direct Dial: [phone]

## 2021-10-09 DIAGNOSIS — R300 Dysuria: Secondary | ICD-10-CM | POA: Insufficient documentation

## 2021-10-09 DIAGNOSIS — B379 Candidiasis, unspecified: Secondary | ICD-10-CM | POA: Insufficient documentation

## 2021-10-11 ENCOUNTER — Telehealth: Payer: Self-pay

## 2021-10-11 NOTE — Progress Notes (Cosign Needed)
Chronic Care Management Pharmacy Assistant   Name: Denise Watts  MRN: [id number] DOB: 1955-05-23  Chart Review for the Clinical Pharmacist for 10/12/2021 at 1:00 pm.  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 08/17/2021 Chrystal Land LCSW (CCM) No medication changes noted. 06/30/2021 Dr. Rosanna Randy MD (PCP) start prednisone 10 mg , start Prednisone 20 mg daily 06/02/2021 Tally Joe FNP (PCP)No Medication Changes noted,  AMB Referral to [place] 05/26/2021 Tally Joe FNP (PCP) start amoxicillin-clavulanate (AUGMENTIN) 875-125 MG tablet 2 time daily, start azithromycin 250 mg , start montelukast 10 mg daily, start prednisone 10 mg , Return in about 1 week   Recent consult visits:  09/23/2021 Raiford Noble PA-C ([place]) Patient no show 09/07/2021 [facility] [place] - Unable to note. 08/27/2021 Dr. Janese Banks MD (Oncology) No Medication Changes noted 08/24/2021  [facility] - Unable to note.  07/06/2021 Zara Council PA (Urology) No medication Changes noted 06/17/2021 [facility] - Unable to note.  06/15/2021 [facility] - Unable to note.  04/13/2021 [facility] [place] - Unable to note.  Hospital visits:  Medication Reconciliation was completed by comparing  discharge summary, patient's EMR and Pharmacy list, and upon discussion with patient.  Admitted to the hospital on 09/01/2021  due to Right leg pain. Discharge date was 09/01/2021. Discharged from [facility]?Medications Started at [facility] Discharge:?? - Started Doxycycline 100 mg 2 times daily - Started Oxycodone 5 mg PRN  Medication Changes at Hospital Discharge: -Changed None ID   Medications Discontinued at Hospital Discharge: -Stopped None ID  Medications that remain the same after Hospital Discharge:??  -All other medications will remain the same.   Admitted to the hospital on 08/29/2021 due to Post-Operative pain. Discharge date was 08/30/2021. Discharged from [place]?Medications Started at [facility] Discharge:?? - Started Oxycodone 5 mg PRN - Started Cephalexin 500 mg 2 times daily  Medication Changes at Hospital Discharge: -Changed None ID   Medications Discontinued at Hospital Discharge: -Stopped None ID  Medications that remain the same after Hospital Discharge:??  -All other medications will remain the same.    Medications: Outpatient Encounter Medications as of 10/11/2021  Medication Sig   albuterol (VENTOLIN HFA) 108 (90 Base) MCG/ACT inhaler Inhale 2 puffs into the lungs every 6 (six) hours as needed for shortness of breath.   aspirin EC 81 MG tablet Take 1 tablet (81 mg total) by mouth daily. Swallow whole.   atorvastatin (LIPITOR) 40 MG tablet Take 1 tablet (40 mg total) by mouth daily.   benzonatate (TESSALON) 100 MG capsule TAKE 2 CAPSULES (200 MG TOTAL) BY MOUTH 3 TIMES DAILY   brompheniramine-pseudoephedrine-DM 30-2-10 MG/5ML syrup Take 5 mLs by mouth 4 (four) times daily as needed.   Budeson-Glycopyrrol-Formoterol (BREZTRI AEROSPHERE) 160-9-4.8 MCG/ACT AERO Inhale 2 puffs into the lungs in the morning and at bedtime.   buPROPion (WELLBUTRIN) 75 MG  tablet Take 75 mg by mouth 2 (two) times daily.    busPIRone (BUSPAR) 5 MG tablet Take 1 tablet (5 mg total) by mouth 2 (two) times daily.   cephALEXin (KEFLEX) 500 MG capsule Take 1 capsule (500 mg total) by mouth 2 (two) times daily.   cyclobenzaprine (FLEXERIL) 5 MG tablet TAKE 1 TABLET BY MOUTH 3 TIMES DAILY AS NEEDED FOR MUSCLE SPASMS   DULoxetine (CYMBALTA) 60 MG capsule Take 1 capsule (60 mg total) by mouth daily.   fluticasone (FLONASE) 50 MCG/ACT nasal spray Place 1 spray into both nostrils at bedtime.   gabapentin (NEURONTIN) 300 MG capsule Take 1 capsule (300 mg total) by mouth 3 (three) times daily.   montelukast (SINGULAIR) 10 MG tablet Take 1 tablet (10 mg total) by mouth at bedtime.   omeprazole (PRILOSEC) 40 MG capsule Take 40 mg by mouth in the morning and at bedtime.   oxybutynin (DITROPAN XL) 15 MG 24 hr tablet Take 1 tablet (15 mg total) by mouth daily.   rOPINIRole (REQUIP) 0.25 MG tablet TAKE 1 TO 2 TABLETS BY MOUTH AT BEDTIME FOR RESTLESS LEGS.   Facility-Administered Encounter Medications as of 10/11/2021  Medication   cyanocobalamin ((VITAMIN B-12)) injection 1,000 mcg   cyanocobalamin ((VITAMIN B-12)) injection 1,000 mcg    Care Gaps: Hepatitis C Screening TETANUS/TDAP Vaccine Shingrix Vaccine DEXA SCAN COVID-19 Vaccine  Star Rating Drugs: Atorvastatin 40 mg last filled 09/30/2021 for 30 day supply at [facility].  Medication Fill Gaps: Buspirone  5 MG last filled 05/26/2021 90 day supply. Montelukast 10 MG last filled 05/26/2021 90 day supply. Omeprazole 40 MG last filled 03/03/2021 90 day supply.  [place] Pharmacist Assistant [phone]

## 2021-10-12 ENCOUNTER — Telehealth: Payer: Medicare HMO

## 2021-10-12 NOTE — Progress Notes (Deleted)
Chronic Care Management Pharmacy Note  10/12/2021 Name:  DEVANN CRIBB MRN:  [id number] DOB:  02-Apr-1956  Summary: ***  Recommendations/Changes made from today's visit: ***  Plan: ***   Subjective: Denise Watts is an 66 y.o. year old female who is a primary patient of [name], [place].  The CCM team was consulted for assistance with disease management and care coordination needs.    Engaged with patient by telephone for initial visit in response to provider referral for pharmacy case management and/or care coordination services.   Consent to Services:  The patient was given the following information about Chronic Care Management services today, agreed to services, and gave verbal consent: 1. CCM service includes personalized support from designated clinical staff supervised by the primary care provider, including individualized plan of care and coordination with other care providers 2. 24/7 contact phone numbers for assistance for urgent and routine care needs. 3. Service will only be billed when office clinical staff spend 20 minutes or more in a month to coordinate care. 4. Only one practitioner may furnish and bill the service in a calendar month. 5.The patient may stop CCM services at any time (effective at the end of the month) by phone call to the office staff. 6. The patient will be responsible for cost sharing (co-pay) of up to 20% of the service fee (after annual deductible is met). Patient agreed to services and consent obtained.  Patient Care Team: [name], FNP as PCP - General (Family Medicine) Sindy Guadeloupe, MD as Consulting Physician (Oncology) Jules Husbands, MD as Consulting Physician (General Surgery) Dasher, Rayvon Char, MD (Dermatology) Scheeler, Carola Rhine, PA-C as Physician Assistant (Plastic Surgery) Harvest Dark, MD as Attending Physician (Emergency Medicine) Vertell Limber (Neurology) Marlowe Sax, MD as Referring Physician  (Internal Medicine) Chesley Mires, MD as Consulting Physician (Pulmonary Disease) Vern Claude, LCSW as Social Worker Germaine Pomfret, [facility] (Pharmacist)  Recent office visits: 10/07/2021 Mardene Speak PA-C (PCP Office) start fluconazole 150 MG tablet once, start cephalexin 500 mg 2 times daily, Bretzri sample was provided, AMB Referral to [place]  10/01/2021 Mardene Speak PA-C (PCP Office) start fluconazole 150 MG tablet once 08/26/2021 Mardene Speak PA-C (PCP Office) No medication changes noted. 08/17/2021 Chrystal Land LCSW (CCM) No medication changes noted. 06/30/2021 Dr. Rosanna Randy MD (PCP) start prednisone 10 mg , start Prednisone 20 mg daily 06/02/2021 Tally Joe FNP (PCP)No Medication Changes noted,  AMB Referral to [place] 05/26/2021 Tally Joe FNP (PCP) start amoxicillin-clavulanate (AUGMENTIN) 875-125 MG tablet 2 time daily, start azithromycin 250 mg , start montelukast 10 mg daily, start prednisone 10 mg , Return in about 1 week   Recent consult visits: 09/23/2021 Raiford Noble PA-C ([place]) Patient no show 09/07/2021 [place] - Unable to note. 08/27/2021 Dr. Janese Banks MD (Oncology) No Medication Changes noted 08/24/2021  [place] - Unable to note.  07/06/2021 Zara Council PA (Urology) No medication Changes noted 06/17/2021 [place] - Unable to note.  06/15/2021 [place] - Unable to note.  04/13/2021 [place] - Unable to note.  Hospital visits: Admitted to the hospital on 09/01/2021  due to Right leg pain. Discharge date was 09/01/2021. Discharged from [place]?Medications Started at [facility] Discharge:?? - Started Doxycycline 100 mg 2 times daily - Started Oxycodone 5 mg PRN   Medication Changes at Hospital Discharge: -Changed None ID    Medications  Discontinued at Hospital Discharge: -Stopped None ID   Medications that remain the same after  Hospital Discharge:??  -All other medications will remain the same.    Admitted to the hospital on 08/29/2021 due to Post-Operative pain. Discharge date was 08/30/2021. Discharged from [place]?Medications Started at [facility] Discharge:?? - Started Oxycodone 5 mg PRN - Started Cephalexin 500 mg 2 times daily   Medication Changes at Hospital Discharge: -Changed None ID    Medications Discontinued at Hospital Discharge: -Stopped None ID   Medications that remain the same after Hospital Discharge:??  -All other medications will remain the same.     Objective:  Lab Results  Component Value Date   CREATININE 0.85 09/01/2021   BUN 16 09/01/2021   EGFR 79 08/27/2021   GFRNONAA >60 09/01/2021   GFRAA >60 01/20/2020   NA 137 09/01/2021   K 5.0 09/01/2021   CALCIUM 9.3 09/01/2021   CO2 23 09/01/2021   GLUCOSE 124 (H) 09/01/2021    Lab Results  Component Value Date/Time   HGBA1C 5.6 05/26/2021 11:01 AM   HGBA1C 6.4 (H) 02/07/2021 08:39 AM    Last diabetic Eye exam: No results found for: "HMDIABEYEEXA"  Last diabetic Foot exam: No results found for: "HMDIABFOOTEX"   Lab Results  Component Value Date   CHOL 135 05/26/2021   HDL 46 05/26/2021   LDLCALC 68 05/26/2021   TRIG 115 05/26/2021   CHOLHDL 2.9 05/26/2021       Latest Ref Rng & Units 09/01/2021   10:40 AM 08/29/2021    9:54 PM 08/27/2021    2:40 PM  Hepatic Function  Total Protein 6.5 - 8.1 g/dL 7.5  6.8  6.9   Albumin 3.5 - 5.0 g/dL 3.8  3.8  3.7   AST 15 - 41 U/L _0 ALT 0 - 44 U/L _1 Alk Phosphatase 38 - 126 U/L 71  80  76   Total Bilirubin 0.3 - 1.2 mg/dL 1.3  0.5  0.5     Lab Results  Component Value Date/Time   TSH 0.858 08/27/2021 08:40 AM   TSH 0.414 10/31/2018 10:49 AM   TSH 1.301 12/27/2017 08:40 AM       Latest Ref Rng & Units 09/01/2021   10:40 AM 08/29/2021    9:54 PM 08/27/2021    2:40 PM  CBC  WBC 4.0 - 10.5 K/uL 9.7  6.9  5.9   Hemoglobin  12.0 - 15.0 g/dL 12.7  12.6  12.6   Hematocrit 36.0 - 46.0 % 41.4  41.2  39.7   Platelets 150 - 400 K/uL 215  217  180     No results found for: "VD25OH"  Clinical ASCVD: {YES/NO:21197} The 10-year ASCVD risk score (Arnett DK, et al., 2019) is: 6.2%   Values used to calculate the score:     Age: 51 years     Sex: Female     Is Non-Hispanic African American: No     Diabetic: No     Tobacco smoker: No     Systolic Blood Pressure: 814 mmHg     Is BP treated: No     HDL Cholesterol: 46 mg/dL     Total Cholesterol: 135 mg/dL       08/17/2021    2:16 PM 01/12/2021    3:56 PM 03/02/2020   11:07 AM  Depression screen PHQ 2/9  Decreased Interest 0 1  0  Down, Depressed, Hopeless _0 PHQ - 2 Score _1 Altered sleeping  1   Tired, decreased energy  3   Change in appetite  0   Feeling bad or failure about yourself   0   Trouble concentrating  0   Moving slowly or fidgety/restless  1   Suicidal thoughts  0   PHQ-9 Score  7   Difficult doing work/chores  Somewhat difficult      ***Other: (CHADS2VASc if Afib, MMRC or CAT for COPD, ACT, DEXA)  Social History   Tobacco Use  Smoking Status Former   Packs/day: 1.50   Years: 30.00   Total pack years: 45.00   Types: Cigarettes   Quit date: 05/03/1995   Years since quitting: 26.4  Smokeless Tobacco Never   BP Readings from Last 3 Encounters:  10/07/21 121/69  10/01/21 120/69  09/23/21 (!) 128/92   Pulse Readings from Last 3 Encounters:  10/07/21 94  10/01/21 (!) 101  09/23/21 91   Wt Readings from Last 3 Encounters:  10/07/21 137 lb 6.4 oz (62.3 kg)  10/01/21 136 lb 12.8 oz (62.1 kg)  08/29/21 140 lb (63.5 kg)   BMI Readings from Last 3 Encounters:  10/07/21 25.96 kg/m  10/01/21 25.85 kg/m  08/29/21 26.45 kg/m    Assessment/Interventions: Review of patient past medical history, allergies, medications, health status, including review of consultants reports, laboratory and other test data, was performed as part  of comprehensive evaluation and provision of chronic care management services.   SDOH:  (Social Determinants of Health) assessments and interventions performed: Yes  SDOH Screenings   Alcohol Screen: Low Risk  (03/02/2020)   Alcohol Screen    Last Alcohol Screening Score (AUDIT): 3  Depression (PHQ2-9): Low Risk  (08/17/2021)   Depression (PHQ2-9)    PHQ-2 Score: 1  Financial Resource Strain: Low Risk  (03/02/2020)   Overall Financial Resource Strain (CARDIA)    Difficulty of Paying Living Expenses: Not hard at all  Food Insecurity: No Food Insecurity (08/17/2021)   Hunger Vital Sign    Worried About Running Out of Food in the Last Year: Never true    Ran Out of Food in the Last Year: Never true  Housing: Low Risk  (08/17/2021)   Housing    Last Housing Risk Score: 0  Physical Activity: Insufficiently Active (03/02/2020)   Exercise Vital Sign    Days of Exercise per Week: 3 days    Minutes of Exercise per Session: 30 min  Social Connections: Moderately Isolated (03/02/2020)   Social Connection and Isolation Panel [NHANES]    Frequency of Communication with Friends and Family: More than three times a week    Frequency of Social Gatherings with Friends and Family: More than three times a week    Attends Religious Services: Never    Marine scientist or Organizations: No    Attends Archivist Meetings: Never    Marital Status: Married  Stress: Stress Concern Present (08/17/2021)   [employer] [place]    Feeling of Stress : Rather much  Tobacco Use: Medium Risk (10/07/2021)   Patient History    Smoking Tobacco Use: Former    Smokeless Tobacco Use: Never    Passive Exposure: Not on file  Transportation Needs: No Transportation Needs (08/17/2021)   PRAPARE - Hydrologist (Medical): No    Lack of Transportation (Non-Medical): No  CCM Care Plan  Allergies  Allergen Reactions   Sulfa  Antibiotics Other (See Comments)    Mouth sore and raw    Medications Reviewed Today     Reviewed by Alanson Puls, CMA (Certified Medical Assistant) on 10/07/21 at 1520  Med List Status: <None>   Medication Order Taking? Sig Documenting Provider Last Dose Status Informant  albuterol (VENTOLIN HFA) 108 (90 Base) MCG/ACT inhaler [id number] No Inhale 2 puffs into the lungs every 6 (six) hours as needed for shortness of breath. Menshew, Dannielle Karvonen, PA-C Taking Active Self  aspirin EC 81 MG tablet [id number] No Take 1 tablet (81 mg total) by mouth daily. Swallow whole. Kate Sable, MD Taking Active Self  atorvastatin (LIPITOR) 40 MG tablet [id number] No Take 1 tablet (40 mg total) by mouth daily. Kate Sable, MD Taking Active   benzonatate (TESSALON) 100 MG capsule [id number] No TAKE 2 CAPSULES (200 MG TOTAL) BY MOUTH 3 TIMES DAILY Martyn Ehrich, NP Taking Active   brompheniramine-pseudoephedrine-DM 30-2-10 MG/5ML syrup [id number] No Take 5 mLs by mouth 4 (four) times daily as needed. Menshew, Dannielle Karvonen, PA-C Taking Active Self  Budeson-Glycopyrrol-Formoterol (BREZTRI AEROSPHERE) 160-9-4.8 MCG/ACT AERO [id number] No Inhale 2 puffs into the lungs in the morning and at bedtime. Chesley Mires, MD Taking Active Self  buPROPion [facility]) 75 MG tablet [id number] No Take 75 mg by mouth 2 (two) times daily. [provider] Taking Active   busPIRone (BUSPAR) 5 MG tablet [id number] No Take 1 tablet (5 mg total) by mouth 2 (two) times daily. [name], FNP Taking Active   cyclobenzaprine (FLEXERIL) 5 MG tablet [id number] No TAKE 1 TABLET BY MOUTH 3 TIMES DAILY AS NEEDED FOR MUSCLE SPASMS [name], FNP Taking Active   DULoxetine (CYMBALTA) 60 MG capsule [id number] No Take 1 capsule (60 mg total) by mouth daily. [name], FNP Taking Active   fluticasone [facility]) 50 MCG/ACT nasal spray [id number] No Place 1 spray into both nostrils at bedtime. Mar Daring,  PA-C Taking Active Self  gabapentin (NEURONTIN) 300 MG capsule [id number] No Take 1 capsule (300 mg total) by mouth 3 (three) times daily. Jerrol Banana., MD Taking Active   montelukast (SINGULAIR) 10 MG tablet [id number] No Take 1 tablet (10 mg total) by mouth at bedtime. [name], FNP Taking Active   omeprazole (PRILOSEC) 40 MG capsule [id number] No Take 40 mg by mouth in the morning and at bedtime. [provider] Taking Active Self  oxybutynin (DITROPAN XL) 15 MG 24 hr tablet [id number] No Take 1 tablet (15 mg total) by mouth daily. Hollice Espy, MD Taking Active Self  rOPINIRole (REQUIP) 0.25 MG tablet [id number] No TAKE 1 TO 2 TABLETS BY MOUTH AT BEDTIME FOR RESTLESS LEGS. Birdie Sons, MD Taking Active             Patient Active Problem List   Diagnosis Date Noted   Candidiasis 10/09/2021   Dysuria 10/09/2021   Cataract of both eyes 08/26/2021   Stress and adjustment reaction 06/02/2021   Acute bronchitis with COPD ([place]) 05/26/2021   Pulmonary emphysema ([place]) 05/26/2021   Need for influenza vaccination 05/26/2021   Need for prophylactic vaccination against Streptococcus pneumoniae (pneumococcus) 05/26/2021   Prediabetes 05/26/2021   Mixed hyperlipidemia 05/26/2021   Right ear pain 05/26/2021   Acute hypoxemic respiratory failure due to COVID-19 ([place]) 02/01/2021   SOB (shortness of breath) 02/01/2021   Centrilobular emphysema ([place]) 12/08/2020   Anxiety and  depression 10/15/2020   Chronic cough 08/31/2020   Encounter for screening colonoscopy    Polyp of transverse colon    Dysphagia    Stricture and stenosis of esophagus    Cervical spondylosis 03/18/2019   [place]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   Need for prophylactic vaccination and inoculation against influenza 01/25/2019   Postoperative breast asymmetry 01/04/2019   Status post partial mastectomy 01/04/2019   Vitamin D deficiency 12/12/2018   Bursitis of left shoulder 11/21/2018   Rotator  cuff tendinitis, left 11/21/2018   Genetic testing 02/01/2018   Skin cancer 01/18/2018   Goals of care, counseling/discussion 01/12/2018   B12 deficiency 12/28/2017   Malignant neoplasm of lower-outer quadrant of right breast of female, estrogen receptor negative ([place]) 11/24/2017   Chronic low back pain (Secondary Area of Pain) (B) (R>L) 04/03/2017   Chronic knee pain (Tertiary Area of Pain) (R) 04/03/2017   Chronic pain syndrome 04/03/2017   Other specified health status 04/03/2017   Other long term (current) drug therapy 04/03/2017   Long term current use of opiate analgesic 04/03/2017   Chronic sacroiliac joint pain 04/03/2017   S/P repair of ventral hernia 03/03/2017   Incisional hernia, without obstruction or gangrene 02/13/2017   Right adrenal mass ([place]) 12/28/2016   RIGHT Adrenal Mass 12/16/2016   Fatigue 02/17/2016   Other fatigue 02/17/2016   Multiple lung nodules on CT 02/17/2016   History of right breast cancer 02/17/2016   Peripheral neuropathy 02/17/2016   Closed fracture of a rib 02/02/2015   History of fundoplication 70/96/2836   History of throat cancer 01/15/2015   Acid reflux 01/15/2015   Gonalgia 01/15/2015   Closed fracture of pubis ([place]) 01/15/2015   Pelvic fracture ([place]) 01/12/2015   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   Herpes simplex virus (HSV) epithelial keratitis 03/06/2013   Nuclear sclerotic cataract 03/06/2013   Onychia of finger 10/23/2008   Odontogenic tumor 03/13/2008   Tobacco use 06/14/2007    Immunization History  Administered Date(s) Administered   Fluad Quad(high Dose 65+) 01/31/2019, 02/20/2020, 05/26/2021   Influenza,inj,Quad PF,6+ Mos 03/02/2017, 06/08/2018   PFIZER(Purple Top)SARS-COV-2 Vaccination 07/05/2019, 07/26/2019, 03/31/2020   PNEUMOCOCCAL CONJUGATE-20 05/26/2021    Conditions to be addressed/monitored:  GERD, COPD, Depression, Anxiety, Tobacco use, Overactive Bladder, and Chronic Pain,   There are no care plans that  you recently modified to display for this patient.    Medication Assistance: {MEDASSISTANCEINFO:25044}  Compliance/Adherence/Medication fill history: Care Gaps: Hepatitis C Screening TETANUS/TDAP Vaccine Shingrix Vaccine DEXA SCAN COVID-19 Vaccine  Star-Rating Drugs: Atorvastatin 40 mg last filled 09/30/2021 for [facility] day [facility].  Patient's preferred pharmacy is:  [facility], [address] [place] [address] Phone: [phone] Fax: [phone]  Uses pill box? {Yes or If no, why not?:20788} Pt endorses ***% compliance  We discussed: {Pharmacy options:24294} Patient decided to: {US Pharmacy Plan:23885}  Care Plan and Follow Up Patient Decision:  {FOLLOWUP:24991}  Plan: {CM FOLLOW UP BSWH:67591}  ***  Current Barriers:  {pharmacybarriers:24917}  Pharmacist Clinical Goal(s):  Patient will {PHARMACYGOALCHOICES:24921} through collaboration with PharmD and provider.   Interventions: 1:1 collaboration with [name], FNP regarding development and update of comprehensive plan of care as evidenced by provider attestation and co-signature Inter-disciplinary care team collaboration (see longitudinal plan of care) Comprehensive medication review performed; medication list updated in electronic medical record  Hyperlipidemia: (LDL goal < ***) -{US controlled/uncontrolled:25276} -Current treatment: Atorvastatin 40 mg daily  -Current treatment: Aspirin  81 mg daily  -Medications previously tried: ***  -Current dietary patterns: *** -Current exercise habits: *** -Educated on {CCM HLD Counseling:25126} -{CCMPHARMDINTERVENTION:25122}   COPD (Goal: control symptoms and prevent exacerbations) -{US controlled/uncontrolled:25276} -Current treatment  Albuterol 2 puffs every 6 hours as needed  Breztri 2 puffs twice daily  -Medications previously tried: ***  -Gold Grade: {CHL HP Upstream Pharm  COPD Gold QDVZR:[id number]} -Current COPD Classification:  {CHL HP Upstream Pharm COPD [phone]} -MMRC/CAT score: *** -Pulmonary function testing: *** -Exacerbations requiring treatment in last 6 months: *** -Patient {Actions; denies-reports:120008} consistent use of maintenance inhaler -Frequency of rescue inhaler use: *** -Counseled on {CCMINHALERCOUNSELING:25121} -{CCMPHARMDINTERVENTION:25122}  Depression/Anxiety (Goal: ***) -{US controlled/uncontrolled:25276} -Current treatment: Bupropion 75 mg twice daily  Buspirone 5 mg twice daily  Duloxetine 60 mg daily  -Medications previously tried/failed: *** -PHQ9: *** -GAD7: *** -Connected with *** for mental health support -Educated on {CCM mental health counseling:25127} -{CCMPHARMDINTERVENTION:25122}  *** (Goal: ***) -{US controlled/uncontrolled:25276} -Current treatment  Cyclobenzaprine 5 mg three times daily as needed  Gabapentin 300 mg three times adily  Ropinirole 0.25 mg  -Medications previously tried: ***  -{CCMPHARMDINTERVENTION:25122}  *** (Goal: ***) -{US controlled/uncontrolled:25276} -Current treatment  Oxybutynin XL 15 mg daily  -Medications previously tried: ***  -{CCMPHARMDINTERVENTION:25122}  *** (Goal: ***) -{US controlled/uncontrolled:25276} -Current treatment  Omeprazole 40 mg twice daily  -Medications previously tried: ***  -{CCMPHARMDINTERVENTION:25122}   Patient Goals/Self-Care Activities Patient will:  - {pharmacypatientgoals:24919}  Follow Up Plan: {CM FOLLOW UP BHQG:48303}

## 2021-10-13 ENCOUNTER — Inpatient Hospital Stay: Payer: Medicare HMO

## 2021-10-13 DIAGNOSIS — E538 Deficiency of other specified B group vitamins: Secondary | ICD-10-CM

## 2021-10-13 MED ORDER — CYANOCOBALAMIN 1000 MCG/ML IJ SOLN
1000.0000 ug | Freq: Once | INTRAMUSCULAR | Status: AC
  Administered 2021-10-13: 1000 ug via INTRAMUSCULAR
  Filled 2021-10-13: qty 1

## 2021-10-18 ENCOUNTER — Telehealth: Payer: Self-pay

## 2021-10-18 NOTE — Progress Notes (Signed)
I reach out to patient to reschedule her no show initial appointment with the clinical pharmacist. Patient states she believes she does not need any CCM service at this time, but she is aware she can reach out to her PCP if she change her mind.  [facility] Pharmacist Assistant [phone]

## 2021-10-20 ENCOUNTER — Inpatient Hospital Stay: Payer: Medicare HMO

## 2021-10-20 ENCOUNTER — Other Ambulatory Visit: Payer: Self-pay | Admitting: Oncology

## 2021-10-20 DIAGNOSIS — E538 Deficiency of other specified B group vitamins: Secondary | ICD-10-CM

## 2021-10-20 DIAGNOSIS — Z23 Encounter for immunization: Secondary | ICD-10-CM

## 2021-10-20 MED ORDER — CYANOCOBALAMIN 1000 MCG/ML IJ SOLN
1000.0000 ug | Freq: Once | INTRAMUSCULAR | Status: AC
  Administered 2021-10-20: 1000 ug via INTRAMUSCULAR
  Filled 2021-10-20: qty 1

## 2021-10-23 DIAGNOSIS — U071 COVID-19: Secondary | ICD-10-CM | POA: Diagnosis not present

## 2021-10-25 ENCOUNTER — Other Ambulatory Visit: Payer: Self-pay | Admitting: Primary Care

## 2021-10-25 ENCOUNTER — Other Ambulatory Visit: Payer: Self-pay | Admitting: Family Medicine

## 2021-10-25 DIAGNOSIS — J432 Centrilobular emphysema: Secondary | ICD-10-CM

## 2021-10-25 DIAGNOSIS — R053 Chronic cough: Secondary | ICD-10-CM

## 2021-10-27 ENCOUNTER — Inpatient Hospital Stay: Payer: Medicare HMO

## 2021-10-27 DIAGNOSIS — Z23 Encounter for immunization: Secondary | ICD-10-CM

## 2021-10-27 DIAGNOSIS — E538 Deficiency of other specified B group vitamins: Secondary | ICD-10-CM | POA: Diagnosis not present

## 2021-10-27 MED ORDER — CYANOCOBALAMIN 1000 MCG/ML IJ SOLN
1000.0000 ug | Freq: Once | INTRAMUSCULAR | Status: AC
  Administered 2021-10-27: 1000 ug via INTRAMUSCULAR
  Filled 2021-10-27: qty 1

## 2021-10-29 NOTE — Progress Notes (Unsigned)
Established patient visit   Patient: Denise Watts   DOB: [birth year]   66 y.o. Female  MRN: [id number] Visit Date: 11/03/2021  Today's healthcare provider: [name], FNP  Introduced to nurse practitioner role and practice setting.  All questions answered.  Discussed provider/patient relationship and expectations.   I,Laine Fonner J Paislee Szatkowski,acting as a scribe for [name], FNP.,have documented all relevant documentation on the behalf of [name], FNP,as directed by  [name], FNP while in the presence of [name], FNP.   Chief Complaint  Patient presents with   URI    States her ear is now hurting and started a week ago.    Subjective    HPI HPI     URI   Associated symptoms inlclude congestion, chills, cough, ear pain, shortness of breath, sneezing and sore throat.  Recent episode started more than a month ago.  The problem has been gradually worsening since onset.  Patient  is drinking plenty of fluids. Additional comments: States her ear is now hurting and started a week ago.       Last edited by Smitty Knudsen, CMA on 11/03/2021  3:15 PM.       Medications: Outpatient Medications Prior to Visit  Medication Sig   albuterol (VENTOLIN HFA) 108 (90 Base) MCG/ACT inhaler Inhale 2 puffs into the lungs every 6 (six) hours as needed for shortness of breath.   aspirin EC 81 MG tablet Take 1 tablet (81 mg total) by mouth daily. Swallow whole.   atorvastatin (LIPITOR) 40 MG tablet Take 1 tablet (40 mg total) by mouth daily.   brompheniramine-pseudoephedrine-DM 30-2-10 MG/5ML syrup Take 5 mLs by mouth 4 (four) times daily as needed.   Budeson-Glycopyrrol-Formoterol (BREZTRI AEROSPHERE) 160-9-4.8 MCG/ACT AERO Inhale 2 puffs into the lungs in the morning and at bedtime.   buPROPion (WELLBUTRIN) 75 MG tablet Take 75 mg by mouth 2 (two) times daily.   busPIRone (BUSPAR) 5 MG tablet Take 1 tablet (5 mg total) by mouth 2 (two) times daily.   cyclobenzaprine  (FLEXERIL) 5 MG tablet Take 1 tablet (5 mg total) by mouth at bedtime.   DULoxetine (CYMBALTA) 60 MG capsule Take 1 capsule (60 mg total) by mouth daily.   fluticasone (FLONASE) 50 MCG/ACT nasal spray Place 1 spray into both nostrils at bedtime.   gabapentin (NEURONTIN) 300 MG capsule Take 1 capsule (300 mg total) by mouth 3 (three) times daily.   montelukast (SINGULAIR) 10 MG tablet Take 1 tablet (10 mg total) by mouth at bedtime.   omeprazole (PRILOSEC) 40 MG capsule Take 40 mg by mouth in the morning and at bedtime.   oxybutynin (DITROPAN XL) 15 MG 24 hr tablet Take 1 tablet (15 mg total) by mouth daily.   [DISCONTINUED] rOPINIRole (REQUIP) 0.25 MG tablet TAKE 1 TO 2 TABLETS BY MOUTH AT BEDTIME FOR RESTLESS LEGS.   [DISCONTINUED] benzonatate (TESSALON) 100 MG capsule TAKE 2 CAPSULES (200 MG TOTAL) BY MOUTH 3 TIMES DAILY (Patient not taking: Reported on 11/03/2021)   [DISCONTINUED] cephALEXin (KEFLEX) 500 MG capsule Take 1 capsule (500 mg total) by mouth 2 (two) times daily.   Facility-Administered Medications Prior to Visit  Medication Dose Route Frequency Provider   cyanocobalamin ((VITAMIN B-12)) injection 1,000 mcg  1,000 mcg Intramuscular Q30 days Sindy Guadeloupe, MD   cyanocobalamin ((VITAMIN B-12)) injection 1,000 mcg  1,000 mcg Intramuscular Q30 days Sindy Guadeloupe, MD    Review of Systems  Last CBC Lab Results  Component Value Date   WBC 9.7 09/01/2021   HGB 12.7 09/01/2021   HCT 41.4 09/01/2021   MCV 98.6 09/01/2021   MCH 30.2 09/01/2021   RDW 14.3 09/01/2021   PLT 215 34/19/6222   Last metabolic panel Lab Results  Component Value Date   GLUCOSE 124 (H) 09/01/2021   NA 137 09/01/2021   K 5.0 09/01/2021   CL 104 09/01/2021   CO2 23 09/01/2021   BUN 16 09/01/2021   CREATININE 0.85 09/01/2021   GFRNONAA >60 09/01/2021   CALCIUM 9.3 09/01/2021   PROT 7.5 09/01/2021   ALBUMIN 3.8 09/01/2021   LABGLOB 2.1 08/27/2021   AGRATIO 2.0 08/27/2021   BILITOT 1.3 (H)  09/01/2021   ALKPHOS 71 09/01/2021   AST 26 09/01/2021   ALT 8 09/01/2021   ANIONGAP 10 09/01/2021   Last lipids Lab Results  Component Value Date   CHOL 135 05/26/2021   HDL 46 05/26/2021   LDLCALC 68 05/26/2021   TRIG 115 05/26/2021   CHOLHDL 2.9 05/26/2021   Last hemoglobin A1c Lab Results  Component Value Date   HGBA1C 5.6 05/26/2021   Last thyroid functions Lab Results  Component Value Date   TSH 0.858 08/27/2021   Last vitamin D Lab Results  Component Value Date   25OHVITD2 <1.0 04/03/2017   25OHVITD3 34 04/03/2017   Last vitamin B12 and Folate Lab Results  Component Value Date   VITAMINB12 245 08/27/2021   FOLATE 26.0 12/27/2017       Objective    BP 104/72 (BP Location: Left Arm, Patient Position: Sitting, Cuff Size: Normal)   Pulse 95   Temp 98.4 F (36.9 C) (Oral)   Resp 16   Ht '5\' 1"'$  (1.549 m)   Wt 140 lb 9.6 oz (63.8 kg)   SpO2 96%   BMI 26.57 kg/m  BP Readings from Last 3 Encounters:  11/03/21 104/72  10/07/21 121/69  10/01/21 120/69   Wt Readings from Last 3 Encounters:  11/03/21 140 lb 9.6 oz (63.8 kg)  10/07/21 137 lb 6.4 oz (62.3 kg)  10/01/21 136 lb 12.8 oz (62.1 kg)   SpO2 Readings from Last 3 Encounters:  11/03/21 96%  10/07/21 95%  10/01/21 95%      Physical Exam Vitals and nursing note reviewed.  Constitutional:      General: She is not in acute distress.    Appearance: Normal appearance. She is overweight. She is not ill-appearing, toxic-appearing or diaphoretic.  HENT:     Head: Normocephalic and atraumatic.     Right Ear: Hearing normal. Tenderness present.     Left Ear: Hearing normal. Swelling present. A middle ear effusion is present. Tympanic membrane is erythematous.     Nose:     Right Sinus: Maxillary sinus tenderness present.     Left Sinus: Maxillary sinus tenderness present.     Mouth/Throat:     Mouth: Mucous membranes are moist. No oral lesions.     Tongue: No lesions.     Palate: No lesions.      Pharynx: Oropharynx is clear. Uvula midline. No pharyngeal swelling, oropharyngeal exudate, posterior oropharyngeal erythema or uvula swelling.     Tonsils: No tonsillar exudate or tonsillar abscesses. 0 on the right. 0 on the left.  Neck:     Trachea: Tracheal tenderness present.     Comments: Hx of throat cancer with resection Cardiovascular:     Rate and Rhythm: Normal rate and regular rhythm.  Pulses: Normal pulses.     Heart sounds: Normal heart sounds. No murmur heard.    No friction rub. No gallop.  Pulmonary:     Effort: Pulmonary effort is normal. No respiratory distress.     Breath sounds: Decreased air movement present. No stridor. Examination of the right-upper field reveals wheezing and rhonchi. Examination of the left-upper field reveals wheezing and rhonchi. Examination of the right-middle field reveals rhonchi. Examination of the left-middle field reveals rhonchi. Examination of the right-lower field reveals decreased breath sounds. Examination of the left-lower field reveals decreased breath sounds. Decreased breath sounds, wheezing and rhonchi present. No rales.  Chest:     Chest wall: No tenderness.  Abdominal:     General: Bowel sounds are normal.     Palpations: Abdomen is soft.     Tenderness: There is right CVA tenderness. There is no left CVA tenderness.  Genitourinary:    Vagina: Vaginal discharge present.     Comments: Patient report of ongoing vaginal discharge; declines vaginal exam. Report of thick, itchy, discharge.  Musculoskeletal:        General: No swelling, tenderness, deformity or signs of injury. Normal range of motion.     Cervical back: Full passive range of motion without pain, normal range of motion and neck supple.     Right lower leg: No edema.     Left lower leg: No edema.  Skin:    General: Skin is warm and dry.     Capillary Refill: Capillary refill takes less than 2 seconds.     Coloration: Skin is not jaundiced or pale.     Findings:  No bruising, erythema, lesion or rash.  Neurological:     General: No focal deficit present.     Mental Status: She is alert and oriented to person, place, and time. Mental status is at baseline.     Cranial Nerves: No cranial nerve deficit.     Sensory: No sensory deficit.     Motor: No weakness.     Coordination: Coordination normal.  Psychiatric:        Mood and Affect: Mood normal.        Behavior: Behavior normal.        Thought Content: Thought content normal.        Judgment: Judgment normal.      Results for orders placed or performed in visit on 11/03/21  POCT Urinalysis Dipstick  Result Value Ref Range   Color, UA yellow    Clarity, UA clear    Glucose, UA Negative Negative   Bilirubin, UA negative    Ketones, UA 1.025    Spec Grav, UA 1.025 1.010 - 1.025   Blood, UA positive    pH, UA 6.0 5.0 - 8.0   Protein, UA Negative Negative   Urobilinogen, UA 0.2 0.2 or 1.0 E.U./dL   Nitrite, UA negative    Leukocytes, UA Negative Negative   Appearance     Odor      Assessment & Plan     Problem List Items Addressed This Visit       Respiratory   COPD, frequent exacerbations ([place]) - Primary    Lungs remain coarse; continue to have DOE/difficulty with inclines ex stairs/hills Not on supplement O2 today in office; continue to recommend daily use SPO2 was WDL today Continue to recommend follow up with pulm Will treat for recurrent exacerbation -pred -abx RTC in 2 weeks       Relevant Medications  predniSONE (DELTASONE) 10 MG tablet   benzonatate (TESSALON) 100 MG capsule   levofloxacin (LEVAQUIN) 750 MG tablet   Other Relevant Orders   Ambulatory referral to ENT     Genitourinary   Gross hematuria    +CVA R side pain Complaints of foul smelling urine and vaginal discharge Will send urine for culture given presence of blood and +CVA pain s/p UTI treatment Continue to recommend increase water intake, goal of 64 oz/minimum       Relevant Orders   Urine  Culture     Other   Dysuria    Acute, recurrent -urine POC for leuks, nitrates  Send for cx given +blood Hold off on abx at this time Recommend increase water to flush kidneys +CVA R side pain      Relevant Orders   Urine Culture   POCT Urinalysis Dipstick (Completed)   Ear pain, bilateral    Acute infection in L ear; R ear remains predominant in complaints of pain      Relevant Medications   predniSONE (DELTASONE) 10 MG tablet   levofloxacin (LEVAQUIN) 750 MG tablet   Other Relevant Orders   Ambulatory referral to ENT   Encounter for hepatitis C screening test for low risk patient    Low risk screen Treatable, and curable. If left untreated Hep C can lead to cirrhosis and liver failure. Encourage routine testing; recommend repeat testing if risk factors change.       Relevant Orders   Hepatitis C Antibody   Fatigue    Chronic, unclear diagnosis Has been acutely sick and also with multiple chronic conditions Denies travel or sick contacts Check CMP CBC TSH      Relevant Orders   Comprehensive Metabolic Panel (CMET)   CBC with Differential/Platelet   TSH + free T4   Hemoglobin A1c   Prediabetes    Repeat a1c given tremor Continue to recommend balanced, lower carb meals. Smaller meal size, adding snacks. Choosing water as drink of choice and increasing purposeful exercise.       Relevant Orders   Hemoglobin A1c   Restless leg syndrome    Chronic, stable Due for refill of requip; dose confirmed with patient       Relevant Medications   rOPINIRole (REQUIP) 0.25 MG tablet   Tremor    Acute, unknown cause Patient is under both personal and situational stress with care of ailing husband with chronic alcoholism/HFrEF Check TSH and basic labs, CBC/CMP, A1c      Relevant Orders   TSH + free T4   Vaginal discharge    Acute, recurrent Denies exam Reports thick, itchy discharge Repeat oral antifungal x2 tablets if needed given repeat Abx for COPD  exacerbation       Relevant Medications   fluconazole (DIFLUCAN) 150 MG tablet     Return in about 2 weeks (around 11/17/2021), or if symptoms worsen or fail to improve.      Vonna Kotyk, FNP, have reviewed all documentation for this visit. The documentation on 11/03/21 for the exam, diagnosis, procedures, and orders are all accurate and complete.    [name], [place] [phone] (phone) [phone] (fax)  [place]

## 2021-11-01 ENCOUNTER — Other Ambulatory Visit: Payer: Self-pay | Admitting: Family Medicine

## 2021-11-01 DIAGNOSIS — G2581 Restless legs syndrome: Secondary | ICD-10-CM

## 2021-11-03 ENCOUNTER — Telehealth: Payer: Self-pay | Admitting: *Deleted

## 2021-11-03 ENCOUNTER — Ambulatory Visit: Payer: Self-pay | Admitting: *Deleted

## 2021-11-03 ENCOUNTER — Ambulatory Visit (INDEPENDENT_AMBULATORY_CARE_PROVIDER_SITE_OTHER): Payer: Medicare HMO | Admitting: Family Medicine

## 2021-11-03 ENCOUNTER — Encounter: Payer: Self-pay | Admitting: Family Medicine

## 2021-11-03 VITALS — BP 104/72 | HR 95 | Temp 98.4°F | Resp 16 | Ht 61.0 in | Wt 140.6 lb

## 2021-11-03 DIAGNOSIS — G9339 Other post infection and related fatigue syndromes: Secondary | ICD-10-CM

## 2021-11-03 DIAGNOSIS — N898 Other specified noninflammatory disorders of vagina: Secondary | ICD-10-CM | POA: Diagnosis not present

## 2021-11-03 DIAGNOSIS — G2581 Restless legs syndrome: Secondary | ICD-10-CM | POA: Diagnosis not present

## 2021-11-03 DIAGNOSIS — R300 Dysuria: Secondary | ICD-10-CM

## 2021-11-03 DIAGNOSIS — R7303 Prediabetes: Secondary | ICD-10-CM

## 2021-11-03 DIAGNOSIS — Z1159 Encounter for screening for other viral diseases: Secondary | ICD-10-CM | POA: Insufficient documentation

## 2021-11-03 DIAGNOSIS — R310 Gross hematuria: Secondary | ICD-10-CM | POA: Diagnosis not present

## 2021-11-03 DIAGNOSIS — J441 Chronic obstructive pulmonary disease with (acute) exacerbation: Secondary | ICD-10-CM | POA: Diagnosis not present

## 2021-11-03 DIAGNOSIS — H9203 Otalgia, bilateral: Secondary | ICD-10-CM | POA: Insufficient documentation

## 2021-11-03 DIAGNOSIS — R251 Tremor, unspecified: Secondary | ICD-10-CM | POA: Diagnosis not present

## 2021-11-03 LAB — POCT URINALYSIS DIPSTICK
Bilirubin, UA: NEGATIVE
Blood, UA: POSITIVE
Glucose, UA: NEGATIVE
Ketones, UA: 1.025
Leukocytes, UA: NEGATIVE
Nitrite, UA: NEGATIVE
Protein, UA: NEGATIVE
Spec Grav, UA: 1.025 (ref 1.010–1.025)
Urobilinogen, UA: 0.2 E.U./dL
pH, UA: 6 (ref 5.0–8.0)

## 2021-11-03 MED ORDER — BENZONATATE 100 MG PO CAPS: 200.0000 mg | ORAL_CAPSULE | Freq: Three times a day (TID) | ORAL | 0 refills | Status: DC

## 2021-11-03 MED ORDER — LEVOFLOXACIN 750 MG PO TABS: 750.0000 mg | ORAL_TABLET | Freq: Every day | ORAL | 0 refills | Status: DC

## 2021-11-03 MED ORDER — ROPINIROLE HCL 0.25 MG PO TABS: 0.2500 mg | ORAL_TABLET | Freq: Every day | ORAL | 1 refills | Status: DC

## 2021-11-03 MED ORDER — PREDNISONE 10 MG PO TABS: ORAL_TABLET | ORAL | 0 refills | Status: DC

## 2021-11-03 MED ORDER — FLUCONAZOLE 150 MG PO TABS: ORAL_TABLET | ORAL | 0 refills | Status: DC

## 2021-11-03 NOTE — Assessment & Plan Note (Signed)
Acute, recurrent Denies exam Reports thick, itchy discharge Repeat oral antifungal x2 tablets if needed given repeat Abx for COPD exacerbation

## 2021-11-03 NOTE — Assessment & Plan Note (Signed)
Repeat a1c given tremor Continue to recommend balanced, lower carb meals. Smaller meal size, adding snacks. Choosing water as drink of choice and increasing purposeful exercise.

## 2021-11-03 NOTE — Assessment & Plan Note (Signed)
Acute, unknown cause Patient is under both personal and situational stress with care of ailing husband with chronic alcoholism/HFrEF Check TSH and basic labs, CBC/CMP, A1c

## 2021-11-03 NOTE — Assessment & Plan Note (Signed)
Chronic, unclear diagnosis Has been acutely sick and also with multiple chronic conditions Denies travel or sick contacts Check CMP CBC TSH

## 2021-11-03 NOTE — Telephone Encounter (Signed)
  Care Management   Follow Up Note   11/03/2021 Name: Denise Watts MRN: [id number] DOB: 01-10-1956   Referred by: [name], FNP Reason for referral : Care Coordination   An unsuccessful telephone outreach was attempted today. The patient was referred to the case management team for assistance with care management and care coordination.   Follow up phone call to assess for continued CCM involvement Follow Up Plan:  This social worker left a Dance movement psychotherapist requesting a return call.   Elliot Gurney, [place] Worker  [facility] [phone]

## 2021-11-03 NOTE — Assessment & Plan Note (Signed)
Low risk screen Treatable, and curable. If left untreated Hep C can lead to cirrhosis and liver failure. Encourage routine testing; recommend repeat testing if risk factors change.  

## 2021-11-03 NOTE — Assessment & Plan Note (Signed)
Lungs remain coarse; continue to have DOE/difficulty with inclines ex stairs/hills Not on supplement O2 today in office; continue to recommend daily use SPO2 was WDL today Continue to recommend follow up with pulm Will treat for recurrent exacerbation -pred -abx RTC in 2 weeks

## 2021-11-03 NOTE — Assessment & Plan Note (Signed)
Acute infection in L ear; R ear remains predominant in complaints of pain

## 2021-11-03 NOTE — Assessment & Plan Note (Signed)
+  CVA R side pain Complaints of foul smelling urine and vaginal discharge Will send urine for culture given presence of blood and +CVA pain s/p UTI treatment Continue to recommend increase water intake, goal of 64 oz/minimum

## 2021-11-03 NOTE — Patient Instructions (Signed)
The CDC recommends two doses of Shingrix (the new shingles vaccine) separated by 2 to 6 months for adults age 68 years and older. I recommend checking with your insurance plan regarding coverage for this vaccine.    

## 2021-11-03 NOTE — Telephone Encounter (Signed)
Dawn from [facility],  called in states, needs the full directions for Predisone for patient, states, take 1/2 tablet and the rest is cut off. Please call back . [phone]  Reason for Disposition  [1] Caller has URGENT medicine question about med that PCP or specialist prescribed AND [2] triager unable to answer question  Answer Assessment - Initial Assessment Questions 1. NAME of MEDICATION: "What medicine are you calling about?"     Prednisone 2. QUESTION: "What is your question?" (e.g., double dose of medicine, side effect)     Dosing directions 3. PRESCRIBING HCP: "Who prescribed it?" Reason: if prescribed by specialist, call should be referred to that group.     Tally Joe  Protocols used: Medication Question Call-A-AH

## 2021-11-03 NOTE — Assessment & Plan Note (Signed)
Chronic, stable Due for refill of requip; dose confirmed with patient

## 2021-11-03 NOTE — Assessment & Plan Note (Signed)
Acute, recurrent -urine POC for leuks, nitrates  Send for cx given +blood Hold off on abx at this time Recommend increase water to flush kidneys +CVA R side pain

## 2021-11-04 ENCOUNTER — Other Ambulatory Visit: Payer: Self-pay | Admitting: Family Medicine

## 2021-11-04 DIAGNOSIS — C44722 Squamous cell carcinoma of skin of right lower limb, including hip: Secondary | ICD-10-CM | POA: Diagnosis not present

## 2021-11-04 DIAGNOSIS — R208 Other disturbances of skin sensation: Secondary | ICD-10-CM | POA: Diagnosis not present

## 2021-11-04 DIAGNOSIS — R251 Tremor, unspecified: Secondary | ICD-10-CM

## 2021-11-04 DIAGNOSIS — D0461 Carcinoma in situ of skin of right upper limb, including shoulder: Secondary | ICD-10-CM | POA: Diagnosis not present

## 2021-11-04 DIAGNOSIS — D0471 Carcinoma in situ of skin of right lower limb, including hip: Secondary | ICD-10-CM | POA: Diagnosis not present

## 2021-11-04 DIAGNOSIS — D485 Neoplasm of uncertain behavior of skin: Secondary | ICD-10-CM | POA: Diagnosis not present

## 2021-11-04 DIAGNOSIS — C44629 Squamous cell carcinoma of skin of left upper limb, including shoulder: Secondary | ICD-10-CM | POA: Diagnosis not present

## 2021-11-04 LAB — COMPREHENSIVE METABOLIC PANEL
ALT: 14 IU/L (ref 0–32)
AST: 16 IU/L (ref 0–40)
Albumin/Globulin Ratio: 1.9 (ref 1.2–2.2)
Albumin: 4.1 g/dL (ref 3.8–4.8)
Alkaline Phosphatase: 92 IU/L (ref 44–121)
BUN/Creatinine Ratio: 17 (ref 12–28)
BUN: 13 mg/dL (ref 8–27)
Bilirubin Total: 0.2 mg/dL (ref 0.0–1.2)
CO2: 22 mmol/L (ref 20–29)
Calcium: 9.2 mg/dL (ref 8.7–10.3)
Chloride: 107 mmol/L — ABNORMAL HIGH (ref 96–106)
Creatinine, Ser: 0.77 mg/dL (ref 0.57–1.00)
Globulin, Total: 2.2 g/dL (ref 1.5–4.5)
Glucose: 81 mg/dL (ref 70–99)
Potassium: 4.5 mmol/L (ref 3.5–5.2)
Sodium: 145 mmol/L — ABNORMAL HIGH (ref 134–144)
Total Protein: 6.3 g/dL (ref 6.0–8.5)
eGFR: 84 mL/min/{1.73_m2} (ref >59)

## 2021-11-04 LAB — CBC WITH DIFFERENTIAL/PLATELET
Basophils Absolute: 0 10*3/uL (ref 0.0–0.2)
Basos: 1 %
EOS (ABSOLUTE): 0.2 10*3/uL (ref 0.0–0.4)
Eos: 4 %
Hematocrit: 42.1 % (ref 34.0–46.6)
Hemoglobin: 14 g/dL (ref 11.1–15.9)
Immature Grans (Abs): 0 10*3/uL (ref 0.0–0.1)
Immature Granulocytes: 1 %
Lymphocytes Absolute: 1.2 10*3/uL (ref 0.7–3.1)
Lymphs: 22 %
MCH: 32.3 pg (ref 26.6–33.0)
MCHC: 33.3 g/dL (ref 31.5–35.7)
MCV: 97 fL (ref 79–97)
Monocytes Absolute: 0.5 10*3/uL (ref 0.1–0.9)
Monocytes: 9 %
Neutrophils Absolute: 3.5 10*3/uL (ref 1.4–7.0)
Neutrophils: 63 %
Platelets: 216 10*3/uL (ref 150–450)
RBC: 4.34 x10E6/uL (ref 3.77–5.28)
RDW: 12.9 % (ref 11.7–15.4)
WBC: 5.5 10*3/uL (ref 3.4–10.8)

## 2021-11-04 LAB — HEPATITIS C ANTIBODY: Hep C Virus Ab: NONREACTIVE

## 2021-11-04 LAB — HEMOGLOBIN A1C
Est. average glucose Bld gHb Est-mCnc: 117 mg/dL
Hgb A1c MFr Bld: 5.7 % — ABNORMAL HIGH (ref 4.8–5.6)

## 2021-11-04 LAB — TSH+FREE T4
Free T4: 1.03 ng/dL (ref 0.82–1.77)
TSH: 0.536 u[IU]/mL (ref 0.450–4.500)

## 2021-11-04 NOTE — Telephone Encounter (Signed)
Spoke with pharmacy, script has already been clarified and picked up.

## 2021-11-04 NOTE — Progress Notes (Signed)
Labs continue to show improved blood sugar average, consistent with prediabetes. Continue to recommend balanced, lower carb meals. Smaller meal size, adding snacks. Choosing water as drink of choice and increasing purposeful exercise.  Blood chemistry is stable. Ensure 64 oz of water daily.  Normal blood counts. Reassuring given acute on chronic illness.  Tremor is unrelated to thyroid. Continue to recommend follow up with neurology.  [name], [place] [address] [place],  [address] [phone] (phone) [phone] (fax) [place]

## 2021-11-05 ENCOUNTER — Ambulatory Visit
Admission: EM | Admit: 2021-11-05 | Discharge: 2021-11-05 | Disposition: A | Discharge status: Home / Self Care | Payer: Medicare HMO | Attending: Urgent Care | Admitting: Urgent Care

## 2021-11-05 ENCOUNTER — Ambulatory Visit: Payer: Self-pay

## 2021-11-05 ENCOUNTER — Encounter: Payer: Self-pay | Admitting: Emergency Medicine

## 2021-11-05 DIAGNOSIS — R109 Unspecified abdominal pain: Secondary | ICD-10-CM | POA: Diagnosis not present

## 2021-11-05 DIAGNOSIS — R140 Abdominal distension (gaseous): Secondary | ICD-10-CM | POA: Diagnosis not present

## 2021-11-05 LAB — POCT URINALYSIS DIP (MANUAL ENTRY)
Bilirubin, UA: NEGATIVE
Blood, UA: NEGATIVE
Glucose, UA: NEGATIVE mg/dL
Ketones, POC UA: NEGATIVE mg/dL
Leukocytes, UA: NEGATIVE
Nitrite, UA: NEGATIVE
Protein Ur, POC: NEGATIVE mg/dL
Spec Grav, UA: 1.025 (ref 1.010–1.025)
Urobilinogen, UA: 0.2 E.U./dL
pH, UA: 6 (ref 5.0–8.0)

## 2021-11-05 MED ORDER — LANSOPRAZOLE 30 MG PO CPDR: 30.0000 mg | DELAYED_RELEASE_CAPSULE | Freq: Two times a day (BID) | ORAL | 0 refills | Status: DC

## 2021-11-05 MED ORDER — CLARITHROMYCIN 500 MG PO TABS: 500.0000 mg | ORAL_TABLET | Freq: Two times a day (BID) | ORAL | 0 refills | Status: AC

## 2021-11-05 MED ORDER — AMOXICILLIN 500 MG PO CAPS: 1000.0000 mg | ORAL_CAPSULE | Freq: Two times a day (BID) | ORAL | 0 refills | Status: AC

## 2021-11-05 NOTE — ED Triage Notes (Signed)
Pt c/o abdominal pain and bloating x 1 month. Pt denies vomiting or nausea. Her last BM was yesterday. She has tried Tums for relief.

## 2021-11-05 NOTE — ED Provider Notes (Signed)
Denise Watts    CSN: [id number] Arrival date & time: 11/05/21  1633      History   Chief Complaint Chief Complaint  Patient presents with   Abdominal Pain    HPI Denise Watts is a 66 y.o. female.   66yo female with multiple comorbidities presents today with concern of generalized abdominal pain coupled with foul breath, abdominal distension and excess flatulence for 1-2 months. Denise Watts states Denise Watts cannot burp due to an esophageal procedure Denise Watts had several years ago. Denise Watts reports chronic constipation, last normal BM yesterday. Denies any change in medications, recent travel, or known event that may have caused her abdominal symptoms. Pt also c/o worsening flank pain. Was seen at her PCP on 11/03/21 in which UA was negative, urine culture shows preliminary gram negative rods. Pt was prescribed levaquin for ear infection, but has not yet taken it.    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   Cough 03/02/2017   INTERMTTENT DRY COUGH   Depression    GERD (gastroesophageal reflux disease)    History of hiatal hernia    Personal history of chemotherapy    Personal history of radiation therapy    Skin cancer    Throat cancer ([place]) 1998   radiation    Patient Active Problem List   Diagnosis Date Noted   Vaginal discharge 11/03/2021   Ear pain, bilateral 11/03/2021   Encounter for hepatitis C screening test for low risk patient 11/03/2021   Tremor 11/03/2021   COPD, frequent exacerbations ([place]) 11/03/2021   Restless leg syndrome 11/03/2021   Gross hematuria 11/03/2021   Candidiasis 10/09/2021   Dysuria 10/09/2021   Cataract of both eyes 08/26/2021   Stress and adjustment reaction 06/02/2021   Acute bronchitis with COPD ([place])  05/26/2021   Pulmonary emphysema ([place]) 05/26/2021   Need for influenza vaccination 05/26/2021   Need for prophylactic vaccination against Streptococcus pneumoniae (pneumococcus) 05/26/2021   Prediabetes 05/26/2021   Mixed hyperlipidemia 05/26/2021   Right ear pain 05/26/2021   Acute hypoxemic respiratory failure due to COVID-19 ([place]) 02/01/2021   SOB (shortness of breath) 02/01/2021   Centrilobular emphysema ([place]) 12/08/2020   Anxiety and depression 10/15/2020   Chronic cough 08/31/2020   Encounter for screening colonoscopy    Polyp of transverse colon    Dysphagia    Stricture and stenosis of esophagus    Cervical spondylosis 03/18/2019   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   Need for prophylactic vaccination and inoculation against influenza 01/25/2019   Postoperative breast asymmetry 01/04/2019   Status post partial mastectomy 01/04/2019   Vitamin D deficiency 12/12/2018   Bursitis of left shoulder 11/21/2018   Rotator cuff tendinitis, left 11/21/2018   Genetic testing 02/01/2018   Skin cancer 01/18/2018   Goals of care, counseling/discussion 01/12/2018   B12 deficiency 12/28/2017   Malignant neoplasm of lower-outer quadrant of right breast of female, estrogen receptor negative ([place]) 11/24/2017   Chronic low back pain (Secondary Area of Pain) (B) (R>L) 04/03/2017   Chronic knee pain [facility] Area of Pain) (R) 04/03/2017   Chronic pain syndrome 04/03/2017   Other specified health status 04/03/2017   Other long term (current) drug therapy 04/03/2017   Long term current  use of opiate analgesic 04/03/2017   Chronic sacroiliac joint pain 04/03/2017   S/P repair of ventral hernia 03/03/2017   Incisional hernia, without obstruction or gangrene 02/13/2017   Right adrenal mass ([place]) 12/28/2016   RIGHT Adrenal Mass 12/16/2016   Fatigue 02/17/2016   Other fatigue 02/17/2016   Multiple lung nodules on CT 02/17/2016   History of right breast cancer 02/17/2016   Peripheral  neuropathy 02/17/2016   Closed fracture of a rib 02/02/2015   History of fundoplication 76/16/0737   History of throat cancer 01/15/2015   Acid reflux 01/15/2015   Gonalgia 01/15/2015   Closed fracture of pubis ([place]) 01/15/2015   Pelvic fracture ([place]) 01/12/2015   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   Herpes simplex virus (HSV) epithelial keratitis 03/06/2013   Nuclear sclerotic cataract 03/06/2013   Onychia of finger 10/23/2008   Odontogenic tumor 03/13/2008   Tobacco use 06/14/2007    Past Surgical History:  Procedure Laterality Date   BREAST BIOPSY Right 1997   positive   BREAST BIOPSY Right 11/15/2017   INVASIVE MAMMARY CARCINOMA triple negative   BREAST BIOPSY Right 07/20/2020   [place] bx, Q marker, neg   BREAST LUMPECTOMY Right 1997   2019 also   BREAST LUMPECTOMY WITH SENTINEL LYMPH NODE BIOPSY Right 12/08/2017   Procedure: BREAST LUMPECTOMY WITH SENTINEL LYMPH NODE BX;  Surgeon: Robert Bellow, MD;  Location: [facility] ORS;  Service: General;  Laterality: Right;   BREAST REDUCTION WITH MASTOPEXY Left 06/10/2019   Procedure: Left breast mastopexy reduction for symmetry;  Surgeon: Wallace Going, DO;  Location: [place];  Service: Plastics;  Laterality: Left;  total 2.5 hours   COLONOSCOPY  2015   COLONOSCOPY WITH PROPOFOL N/A 10/29/2019   Procedure: COLONOSCOPY WITH PROPOFOL;  Surgeon: Lucilla Lame, MD;  Location[facility] END[facility]COPY;  Service: Endoscopy;  Laterality: N/A;   ESOPHAGOGASTRODUODENOSCOPY (EGD) WITH PROPOFOL N/A 05/24/2019   Procedure: ESOPHAGOGASTRODUODENOSCOPY (EGD) WITH PROPOFOL;  Surgeon: Lucilla Lame, MD;  Location: [facility] ENDOSCOPY;  Service: Endoscopy;  Laterality: N/A;   ESOPHAGUS SURGERY     KNEE SURGERY Right    LIPOSUCTION WITH LIPOFILLING Right 06/10/2019   Procedure: right breast scar and capsule release with fat grafting;  Surgeon: Wallace Going, DO;  Location: [facility];  Service: Plastics;  Laterality:  Right;   PORT-A-CATH REMOVAL     PORTACATH PLACEMENT Right 01/17/2018   Procedure: INSERTION PORT-A-CATH- RIGHT;  Surgeon: Robert Bellow, MD;  Location: [place] [facility] [facility];  Service: General;  Laterality: Right;   ROBOTIC ADRENALECTOMY Right 12/28/2016   Procedure: ROBOTIC ADRENALECTOMY;  Surgeon: Hollice Espy, MD;  Location: [facility] ORS;  Service: Urology;  Laterality: Right;   SKIN CANCER EXCISION     THROAT SURGERY  1998   throat cancer    TUBAL LIGATION     VENTRAL HERNIA REPAIR N/A 03/03/2017   Procedure: HERNIA REPAIR VENTRAL ADULT;  Surgeon: Clayburn Pert, MD;  Location: [facility] ORS;  Service: General;  Laterality: N/A;    OB History     Gravida  5   Para  3   Term      Preterm      AB      Living         SAB      IAB      Ectopic      Multiple      Live Births           Obstetric Comments  1st Menstrual  Cycle:  9  1st Pregnancy:  15           Home Medications    Prior to Admission medications   Medication Sig Start Date End Date Taking? Authorizing Provider  amoxicillin (AMOXIL) 500 MG capsule Take 2 capsules (1,000 mg total) by mouth 2 (two) times daily for 14 days. 11/05/21 11/19/21 Yes Sarit Sparano L, PA  clarithromycin (BIAXIN) 500 MG tablet Take 1 tablet (500 mg total) by mouth 2 (two) times daily for 14 days. 11/05/21 11/19/21 Yes Hans Rusher L, PA  lansoprazole (PREVACID) 30 MG capsule Take 1 capsule (30 mg total) by mouth 2 (two) times daily before a meal for 14 days. 11/05/21 11/19/21 Yes Brighten Buzzelli L, PA  albuterol (VENTOLIN HFA) 108 (90 Base) MCG/ACT inhaler Inhale 2 puffs into the lungs every 6 (six) hours as needed for shortness of breath. 01/29/21   Menshew, Dannielle Karvonen, PA-C  aspirin EC 81 MG tablet Take 1 tablet (81 mg total) by mouth daily. Swallow whole. 10/19/20   Kate Sable, MD  benzonatate (TESSALON) 100 MG capsule Take 2 capsules (200 mg total) by mouth 3 (three) times daily. 11/03/21   [name], FNP   brompheniramine-pseudoephedrine-DM 30-2-10 MG/5ML syrup Take 5 mLs by mouth 4 (four) times daily as needed. 01/29/21   Menshew, Dannielle Karvonen, PA-C  Budeson-Glycopyrrol-Formoterol (BREZTRI AEROSPHERE) 160-9-4.8 MCG/ACT AERO Inhale 2 puffs into the lungs in the morning and at bedtime. 08/06/20   Chesley Mires, MD  buPROPion (WELLBUTRIN) 75 MG tablet Take 75 mg by mouth 2 (two) times daily. 05/28/21   [provider]  busPIRone (BUSPAR) 5 MG tablet Take 1 tablet (5 mg total) by mouth 2 (two) times daily. 05/26/21   [name], FNP  cyclobenzaprine (FLEXERIL) 5 MG tablet Take 1 tablet (5 mg total) by mouth at bedtime. 10/26/21   [name], FNP  DULoxetine (CYMBALTA) 60 MG capsule Take 1 capsule (60 mg total) by mouth daily. 05/26/21 05/26/22  [name], FNP  fluconazole (DIFLUCAN) 150 MG tablet Take one by mouth for vaginal yeast; repeat in 4 days if symptoms remain 11/03/21   [name], FNP  fluticasone [facility]) 50 MCG/ACT nasal spray Place 1 spray into both nostrils at bedtime. 06/19/20   Mar Daring, PA-C  gabapentin (NEURONTIN) 300 MG capsule Take 1 capsule (300 mg total) by mouth 3 (three) times daily. 03/09/21   Jerrol Banana., MD  montelukast (SINGULAIR) 10 MG tablet Take 1 tablet (10 mg total) by mouth at bedtime. 05/26/21   [name], FNP  oxybutynin (DITROPAN XL) 15 MG 24 hr tablet Take 1 tablet (15 mg total) by mouth daily. 12/09/20   Hollice Espy, MD  predniSONE (DELTASONE) 10 MG tablet Day 1 take 6 tablets Day 2 take 6 tablets Day 3 take 5 tablets Day 4 take 5 tablets Day 5 take 4 tablets Day 6 take 4 tablets Day 7 take 3 tablets Day 8 take 3 tablets Day 9 take 2 tablets Day 10 take 2 tablets Day 11 take 1 tablet Day 12 take 1 tablet Day 13 take 1/2 tablet Day 14 take 1/2 tablet 11/03/21   Tally Joe T, FNP  rOPINIRole (REQUIP) 0.25 MG tablet Take 1 tablet (0.25 mg total) by mouth at bedtime. 11/03/21   [name], FNP    Family History Family  History  Problem Relation Age of Onset   Diabetes Mother    Heart attack Mother  Cancer Sister        cancer of eyes, spread to lung, other places. died at 42   Cancer Sister 42       unknown primary, spread to bone, brain died in 54's   Cancer Paternal Aunt        unk type   Cancer Paternal Grandmother        type unk   Prostate cancer Neg Hx    Kidney cancer Neg Hx    Bladder Cancer Neg Hx     Social History Social History   Tobacco Use   Smoking status: Former    Packs/day: 1.50    Years: 30.00    Total pack years: 45.00    Types: Cigarettes    Quit date: 05/03/1995    Years since quitting: 26.5   Smokeless tobacco: Never  Vaping Use   Vaping Use: Never used  Substance Use Topics   Alcohol use: Yes    Alcohol/week: 4.0 standard drinks of alcohol    Types: 4 Shots of liquor per week    Comment: 2 per night on weekends   Drug use: No     Allergies   Sulfa antibiotics   Review of Systems Review of Systems  Gastrointestinal:  Positive for abdominal pain.  As per HPI   Physical Exam Triage Vital Signs ED Triage Vitals  Enc Vitals Group     BP 11/05/21 1648 122/80     Pulse Rate 11/05/21 1648 92     Resp 11/05/21 1648 16     Temp 11/05/21 1648 98.1 F (36.7 C)     Temp Source 11/05/21 1648 Oral     SpO2 11/05/21 1648 94 %     Weight --      Height --      Head Circumference --      Peak Flow --      Pain Score 11/05/21 1646 5     Pain Loc --      Pain Edu? --      Excl. in [place]? --    No data found.  Updated Vital Signs BP 122/80 (BP Location: Left Arm)   Pulse 92   Temp 98.1 F (36.7 C) (Oral)   Resp 16   SpO2 94%   Visual Acuity Right Eye Distance:   Left Eye Distance:   Bilateral Distance:    Right Eye Near:   Left Eye Near:    Bilateral Near:     Physical Exam Vitals and nursing note reviewed. Exam conducted with a chaperone present.  Constitutional:      General: Denise Watts is not in acute distress.    Appearance: Denise Watts is  well-developed. Denise Watts is not ill-appearing, toxic-appearing or diaphoretic.  HENT:     Head: Normocephalic and atraumatic.     Right Ear: A middle ear effusion is present. Tympanic membrane is scarred (opacified TM). Tympanic membrane is not erythematous or bulging.     Left Ear: Tympanic membrane is scarred. Tympanic membrane is not erythematous or bulging.     Mouth/Throat:     Mouth: Mucous membranes are moist.     Comments: dentures Eyes:     General: No scleral icterus.    Extraocular Movements: Extraocular movements intact.  Cardiovascular:     Rate and Rhythm: Normal rate and regular rhythm.  Pulmonary:     Effort: Pulmonary effort is normal.     Breath sounds: Normal breath sounds.  Abdominal:     General: Bowel  sounds are increased. There is distension. There is no abdominal bruit. There are no signs of injury.     Palpations: Abdomen is soft. There is no hepatomegaly or splenomegaly.     Tenderness: There is generalized abdominal tenderness. There is right CVA tenderness and left CVA tenderness. There is no guarding or rebound. Negative signs include Murphy's sign, Rovsing's sign and McBurney's sign.     Hernia: A hernia (midline diastasis recti) is present.  Skin:    General: Skin is warm.     Coloration: Skin is not cyanotic.  Neurological:     General: No focal deficit present.     Mental Status: Denise Watts is alert.     Comments: Jaw and L arm tremor at rest      UC Treatments / Results  Labs (all labs ordered are listed, but only abnormal results are displayed) Labs Reviewed  POCT URINALYSIS DIP (MANUAL ENTRY)    EKG   Radiology No results found.  Procedures Procedures (including critical care time)  Medications Ordered in UC Medications - No data to display  Initial Impression / Assessment and Plan / UC Course  I have reviewed the triage vital signs and the nursing notes.  Pertinent labs & imaging results that were available during my care of the patient  were reviewed by me and considered in my medical decision making (see chart for details).     Abdominal pain/ bloating - clinically suspect h. Pylori infection. Cannot perform breath testing due to current PPI use. Discussed empiric therapy for suspected dx, will have to start amxo/biaxin/prevacid x 14 days. Will need to STOP omeprazole, levaquin, and atorvastatin (temporarily). SIBO is another consideration. Pt also on numerous medications that could cause GI upset or constipation, although none are new within the past month. Recommended pt follow up with PCP in two weeks, also consider f/u with GI given hx of esophageal issues. ER precautions discussed Flank pain - pt abnormally tender on exam bilaterally, however VSS and urine dip normal. Urine culture as ordered by PCP preliminary result shows mixed urogenital flora. Abx above will cover for any infectious causes, will need further f/u with PCP  Final Clinical Impressions(s) / UC Diagnoses   Final diagnoses:  Abdominal bloating  Flank pain     Discharge Instructions      Your symptoms sound concerning for Helicobacter pylori infection. This is an infection of the intestinal tract that causes pain, distention, bloating, and gas.  We cannot test you for this because of your omeprazole. It will cause a negative test, even if you have it.  Please stop your levaquin. We will switch you to the approved H pylori treatment, which will also cover for any bacterial lung or ear issues.  Please stop your omeprazole, I am switching it to lansoprazole which is preferred for this treatment. Take it twice daily.  Temporarily stop your atorvastatin. You cannot take this while on the clarithromycin. You can restart it once the antibiotics are done.  Please schedule a follow up with your PCP in 2 weeks for a recheck.     ED Prescriptions     Medication Sig Dispense Auth. Provider   clarithromycin (BIAXIN) 500 MG tablet Take 1 tablet (500 mg  total) by mouth 2 (two) times daily for 14 days. 28 tablet Delbra Zellars L, PA   amoxicillin (AMOXIL) 500 MG capsule Take 2 capsules (1,000 mg total) by mouth 2 (two) times daily for 14 days. 56 capsule [place], [place] L, [place]  lansoprazole (PREVACID) 30 MG capsule Take 1 capsule (30 mg total) by mouth 2 (two) times daily before a meal for 14 days. 28 capsule Somara Frymire L, PA      PDMP not reviewed this encounter.   Chaney Malling, [place] 11/05/21 1801

## 2021-11-05 NOTE — Telephone Encounter (Signed)
  Chief Complaint: Abdominal Pain Symptoms: Abdominal pain 6/10 and bloating Frequency: over 1 month Pertinent Negatives: Patient denies Fever Disposition: '[]'$ ED /'[x]'$ Urgent Care (no appt availability in office) / '[]'$ Appointment(In office/virtual)/ '[]'$  [facility]/ '[]'$ Home Care/ '[]'$ Refused Recommended Disposition /'[]'$ [facility]/ '[]'$  Follow-up with PCP Additional Notes: Pt called for lab work and mentioned that her abdomen has been very painful for over 1 month. Pt also states that her abdomen is very bloated.    Reason for Disposition  [1] MILD-MODERATE pain AND [2] constant AND [3] present > 2 hours  Answer Assessment - Initial Assessment Questions 1. LOCATION: "Where does it hurt?"      The whole abdomen hurts 2. RADIATION: "Does the pain shoot anywhere else?" (e.g., chest, back)     no 3. ONSET: "When did the pain begin?" (e.g., minutes, hours or days ago)      1 month or more 4. SUDDEN: "Gradual or sudden onset?"     gradual 5. PATTERN "Does the pain come and go, or is it constant?"    - If constant: "Is it getting better, staying the same, or worsening?"      (Note: Constant means the pain never goes away completely; most serious pain is constant and it progresses)     - If intermittent: "How long does it last?" "Do you have pain now?"     (Note: Intermittent means the pain goes away completely between bouts)     All the time 6. SEVERITY: "How bad is the pain?"  (e.g., Scale 1-10; mild, moderate, or severe)   - MILD (1-3): doesn't interfere with normal activities, abdomen soft and not tender to touch    - MODERATE (4-7): interferes with normal activities or awakens from sleep, abdomen tender to touch    - SEVERE (8-10): excruciating pain, doubled over, unable to do any normal activities      6/10 7. RECURRENT SYMPTOM: "Have you ever had this type of stomach pain before?" If Yes, ask: "When was the last time?" and "What happened that time?"      no 8. CAUSE:  "What do you think is causing the stomach pain?"     Unsure 9. RELIEVING/AGGRAVATING FACTORS: "What makes it better or worse?" (e.g., movement, antacids, bowel movement)     Laying down feels better 10. OTHER SYMPTOMS: "Do you have any other symptoms?" (e.g., back pain, diarrhea, fever, urination pain, vomiting)       Cough 11. PREGNANCY: "Is there any chance you are pregnant?" "When was your last menstrual period?"       na  Protocols used: Abdominal Pain - [facility]

## 2021-11-05 NOTE — Discharge Instructions (Addendum)
Your symptoms sound concerning for Helicobacter pylori infection. This is an infection of the intestinal tract that causes pain, distention, bloating, and gas.  We cannot test you for this because of your omeprazole. It will cause a negative test, even if you have it.  Please stop your levaquin. We will switch you to the approved H pylori treatment, which will also cover for any bacterial lung or ear issues.  Please stop your omeprazole, I am switching it to lansoprazole which is preferred for this treatment. Take it twice daily.  Temporarily stop your atorvastatin. You cannot take this while on the clarithromycin. You can restart it once the antibiotics are done.  Please schedule a follow up with your PCP in 2 weeks for a recheck.

## 2021-11-05 NOTE — Telephone Encounter (Signed)
Called to ask if patient would like to make an appt, no answer, VM full.

## 2021-11-06 LAB — URINE CULTURE

## 2021-11-08 ENCOUNTER — Ambulatory Visit
Admission: RE | Admit: 2021-11-08 | Discharge: 2021-11-08 | Disposition: A | Discharge status: Home / Self Care | Payer: Medicare HMO | Attending: Family Medicine | Admitting: Family Medicine

## 2021-11-08 ENCOUNTER — Ambulatory Visit
Admission: RE | Admit: 2021-11-08 | Discharge: 2021-11-08 | Disposition: A | Discharge status: Home / Self Care | Payer: Medicare HMO | Source: Ambulatory Visit | Attending: Family Medicine | Admitting: Family Medicine

## 2021-11-08 ENCOUNTER — Ambulatory Visit (INDEPENDENT_AMBULATORY_CARE_PROVIDER_SITE_OTHER): Payer: Medicare HMO | Admitting: Family Medicine

## 2021-11-08 ENCOUNTER — Ambulatory Visit: Payer: Medicare HMO | Admitting: Physician Assistant

## 2021-11-08 VITALS — BP 140/89 | HR 100 | Temp 97.4°F | Wt 139.0 lb

## 2021-11-08 DIAGNOSIS — R1010 Upper abdominal pain, unspecified: Secondary | ICD-10-CM

## 2021-11-08 DIAGNOSIS — C50511 Malignant neoplasm of lower-outer quadrant of right female breast: Secondary | ICD-10-CM | POA: Diagnosis not present

## 2021-11-08 DIAGNOSIS — Z79891 Long term (current) use of opiate analgesic: Secondary | ICD-10-CM | POA: Diagnosis not present

## 2021-11-08 DIAGNOSIS — Z171 Estrogen receptor negative status [ER-]: Secondary | ICD-10-CM

## 2021-11-08 DIAGNOSIS — F32A Depression, unspecified: Secondary | ICD-10-CM

## 2021-11-08 DIAGNOSIS — J441 Chronic obstructive pulmonary disease with (acute) exacerbation: Secondary | ICD-10-CM | POA: Diagnosis not present

## 2021-11-08 DIAGNOSIS — R109 Unspecified abdominal pain: Secondary | ICD-10-CM | POA: Diagnosis not present

## 2021-11-08 DIAGNOSIS — K219 Gastro-esophageal reflux disease without esophagitis: Secondary | ICD-10-CM | POA: Diagnosis not present

## 2021-11-08 DIAGNOSIS — F419 Anxiety disorder, unspecified: Secondary | ICD-10-CM

## 2021-11-08 DIAGNOSIS — Z09 Encounter for follow-up examination after completed treatment for conditions other than malignant neoplasm: Secondary | ICD-10-CM

## 2021-11-08 DIAGNOSIS — Z720 Tobacco use: Secondary | ICD-10-CM | POA: Diagnosis not present

## 2021-11-08 DIAGNOSIS — G8929 Other chronic pain: Secondary | ICD-10-CM

## 2021-11-08 DIAGNOSIS — R1084 Generalized abdominal pain: Secondary | ICD-10-CM

## 2021-11-08 NOTE — Progress Notes (Signed)
Abx was correct for current Ecoli UTI. Pt has appt with [facility] today.  [name], [address] [place], [place] [address] [phone] (phone) [phone] (fax) [place]

## 2021-11-08 NOTE — Progress Notes (Unsigned)
Established patient visit   Patient: Denise Watts   DOB: [birth year]   66 y.o. Female  MRN: [id number] Visit Date: 11/08/2021  Today's healthcare provider: Wilhemena Durie, MD   Chief Complaint  Patient presents with   Abdominal Pain   Subjective    HPI  Patient is a 66 year old female that was seen on 11/03/21 and treated for a UTI and yeast vaginitis.  She reports continued abdominal pain and was then seen on 11/05/21 at an Urgent [facility] and was thought to have H.Pylori.  Due to the patient being on a PPI they were unable to do the breath test.  Therefore, they went ahead and treated her with Biaxion, Amoxil, and Prevacid.  Patient states she started to feel better yesterday but today still having pain.  No vomiting.  She did have one abdominal surgery years ago with an adrenal gland removal  Medications: Outpatient Medications Prior to Visit  Medication Sig   albuterol (VENTOLIN HFA) 108 (90 Base) MCG/ACT inhaler Inhale 2 puffs into the lungs every 6 (six) hours as needed for shortness of breath.   amoxicillin (AMOXIL) 500 MG capsule Take 2 capsules (1,000 mg total) by mouth 2 (two) times daily for 14 days.   aspirin EC 81 MG tablet Take 1 tablet (81 mg total) by mouth daily. Swallow whole.   benzonatate (TESSALON) 100 MG capsule Take 2 capsules (200 mg total) by mouth 3 (three) times daily.   brompheniramine-pseudoephedrine-DM 30-2-10 MG/5ML syrup Take 5 mLs by mouth 4 (four) times daily as needed.   Budeson-Glycopyrrol-Formoterol (BREZTRI AEROSPHERE) 160-9-4.8 MCG/ACT AERO Inhale 2 puffs into the lungs in the morning and at bedtime.   buPROPion (WELLBUTRIN) 75 MG tablet Take 75 mg by mouth 2 (two) times daily.   busPIRone (BUSPAR) 5 MG tablet Take 1 tablet (5 mg total) by mouth 2 (two) times daily.   clarithromycin (BIAXIN) 500 MG tablet Take 1 tablet (500 mg total) by mouth 2 (two) times daily for 14 days.   cyclobenzaprine (FLEXERIL) 5 MG tablet Take 1 tablet (5 mg  total) by mouth at bedtime.   DULoxetine (CYMBALTA) 60 MG capsule Take 1 capsule (60 mg total) by mouth daily.   fluconazole (DIFLUCAN) 150 MG tablet Take one by mouth for vaginal yeast; repeat in 4 days if symptoms remain   fluticasone (FLONASE) 50 MCG/ACT nasal spray Place 1 spray into both nostrils at bedtime.   gabapentin (NEURONTIN) 300 MG capsule Take 1 capsule (300 mg total) by mouth 3 (three) times daily.   lansoprazole (PREVACID) 30 MG capsule Take 1 capsule (30 mg total) by mouth 2 (two) times daily before a meal for 14 days.   montelukast (SINGULAIR) 10 MG tablet Take 1 tablet (10 mg total) by mouth at bedtime.   oxybutynin (DITROPAN XL) 15 MG 24 hr tablet Take 1 tablet (15 mg total) by mouth daily.   predniSONE (DELTASONE) 10 MG tablet Day 1 take 6 tablets Day 2 take 6 tablets Day 3 take 5 tablets Day 4 take 5 tablets Day 5 take 4 tablets Day 6 take 4 tablets Day 7 take 3 tablets Day 8 take 3 tablets Day 9 take 2 tablets Day 10 take 2 tablets Day 11 take 1 tablet Day 12 take 1 tablet Day 13 take 1/2 tablet Day 14 take 1/2 tablet   rOPINIRole (REQUIP) 0.25 MG tablet Take 1 tablet (0.25 mg total) by mouth at bedtime.   Facility-Administered Medications Prior to  Visit  Medication Dose Route Frequency Provider   cyanocobalamin ((VITAMIN B-12)) injection 1,000 mcg  1,000 mcg Intramuscular Q30 days Sindy Guadeloupe, MD   cyanocobalamin ((VITAMIN B-12)) injection 1,000 mcg  1,000 mcg Intramuscular Q30 days Sindy Guadeloupe, MD    Review of Systems  Constitutional:  Positive for fever.  Gastrointestinal:  Positive for abdominal distention, abdominal pain, constipation and nausea. Negative for anal bleeding, blood in stool, diarrhea, rectal pain and vomiting.    Last metabolic panel Lab Results  Component Value Date   GLUCOSE 81 11/03/2021   NA 145 (H) 11/03/2021   K 4.5 11/03/2021   CL 107 (H) 11/03/2021   CO2 22 11/03/2021   BUN 13 11/03/2021   CREATININE 0.77 11/03/2021   EGFR 84  11/03/2021   CALCIUM 9.2 11/03/2021   PROT 6.3 11/03/2021   ALBUMIN 4.1 11/03/2021   LABGLOB 2.2 11/03/2021   AGRATIO 1.9 11/03/2021   BILITOT 0.2 11/03/2021   ALKPHOS 92 11/03/2021   AST 16 11/03/2021   ALT 14 11/03/2021   ANIONGAP 10 09/01/2021       Objective    BP 140/89 (BP Location: Left Arm, Patient Position: Sitting, Cuff Size: Normal)   Pulse 100   Temp (!) 97.4 F (36.3 C) (Oral)   Wt 139 lb (63 kg)   SpO2 97%   BMI 26.26 kg/m  BP Readings from Last 3 Encounters:  11/08/21 140/89  11/05/21 122/80  11/03/21 104/72   Wt Readings from Last 3 Encounters:  11/08/21 139 lb (63 kg)  11/03/21 140 lb 9.6 oz (63.8 kg)  10/07/21 137 lb 6.4 oz (62.3 kg)      Physical Exam Vitals reviewed.  Constitutional:      General: She is not in acute distress.    Appearance: She is well-developed.  HENT:     Head: Normocephalic and atraumatic.     Right Ear: Hearing normal.     Left Ear: Hearing normal.     Nose: Nose normal.  Eyes:     General: Lids are normal. No scleral icterus.       Right eye: No discharge.        Left eye: No discharge.     Conjunctiva/sclera: Conjunctivae normal.  Cardiovascular:     Rate and Rhythm: Normal rate and regular rhythm.     Heart sounds: Normal heart sounds.  Pulmonary:     Effort: Pulmonary effort is normal. No respiratory distress.  Abdominal:     Tenderness: There is abdominal tenderness in the epigastric area. There is no guarding or rebound.  Skin:    Findings: No lesion or rash.  Neurological:     General: No focal deficit present.     Mental Status: She is alert and oriented to person, place, and time.  Psychiatric:        Mood and Affect: Mood normal.        Speech: Speech normal.        Behavior: Behavior normal.        Thought Content: Thought content normal.        Judgment: Judgment normal.     Comments: She is a very anxious lady in no acute distress.       No results found for any visits on 11/08/21.   Assessment & Plan     1. Generalized abdominal pain It is possible that she has a small bowel obstruction but unlikely.  Would obtain a KUB today. Pending GI referral.  Obtain abdominal ultrasound.  Has a history of chronic abdominal tenderness and I do not know if this is different than her baseline.  She is tender across the entire epigastrium with negative Murphy sign and no guarding or rebound. - DG Abd 1 View - US Abdomen Complete - Ambulatory referral to Gastroenterology  2. COPD, frequent exacerbations ([place])   3. Gastroesophageal reflux disease, unspecified whether esophagitis present On treatment for Helicobacter per urgent care  4. Tobacco use   5. H/oMalignant neoplasm of lower-outer quadrant of right breast of female, estrogen receptor negative ([place])   6. Long term current use of opiate analgesic   7. Anxiety and depression Chronic ongoing problem.  Patient appears to be very anxious today.  8. Chronic abdominal pain (Primary Area of Pain)  This is listed in her chronic medical problems. I think this is only the second time of ever seen the patient she was not having the abdominal pain but rather back pain the last time I saw  No follow-ups on file.      I, Wilhemena Durie, MD, have reviewed all documentation for this visit. The documentation on 11/09/21 for the exam, diagnosis, procedures, and orders are all accurate and complete.    Jermya Dowding Cranford Mon, MD  [facility] [phone] (phone) [phone] (fax)  [place]

## 2021-11-08 NOTE — Progress Notes (Deleted)
I,Jana Kailena Lubas,acting as a Education administrator for [employer], PA-C.,have documented all relevant documentation on the behalf of Mardene Speak, PA-C,as directed by  [employer], PA-C while in the presence of [employer], PA-C.   Established patient visit   Patient: Denise Watts   DOB: 01-18-1956   66 y.o. Female  MRN: [id number] Visit Date: 11/08/2021  Today's healthcare provider: Mardene Speak, PA-C   No chief complaint on file. Abdominal pain/bloating  Subjective    Abdominal Pain The current episode started more than 1 month ago. Relieved by: lying down.      Medications: Outpatient Medications Prior to Visit  Medication Sig   albuterol (VENTOLIN HFA) 108 (90 Base) MCG/ACT inhaler Inhale 2 puffs into the lungs every 6 (six) hours as needed for shortness of breath.   amoxicillin (AMOXIL) 500 MG capsule Take 2 capsules (1,000 mg total) by mouth 2 (two) times daily for 14 days.   aspirin EC 81 MG tablet Take 1 tablet (81 mg total) by mouth daily. Swallow whole.   benzonatate (TESSALON) 100 MG capsule Take 2 capsules (200 mg total) by mouth 3 (three) times daily.   brompheniramine-pseudoephedrine-DM 30-2-10 MG/5ML syrup Take 5 mLs by mouth 4 (four) times daily as needed.   Budeson-Glycopyrrol-Formoterol (BREZTRI AEROSPHERE) 160-9-4.8 MCG/ACT AERO Inhale 2 puffs into the lungs in the morning and at bedtime.   buPROPion (WELLBUTRIN) 75 MG tablet Take 75 mg by mouth 2 (two) times daily.   busPIRone (BUSPAR) 5 MG tablet Take 1 tablet (5 mg total) by mouth 2 (two) times daily.   clarithromycin (BIAXIN) 500 MG tablet Take 1 tablet (500 mg total) by mouth 2 (two) times daily for 14 days.   cyclobenzaprine (FLEXERIL) 5 MG tablet Take 1 tablet (5 mg total) by mouth at bedtime.   DULoxetine (CYMBALTA) 60 MG capsule Take 1 capsule (60 mg total) by mouth daily.   fluconazole (DIFLUCAN) 150 MG tablet Take one by mouth for vaginal yeast; repeat in 4 days if symptoms remain   fluticasone (FLONASE)  50 MCG/ACT nasal spray Place 1 spray into both nostrils at bedtime.   gabapentin (NEURONTIN) 300 MG capsule Take 1 capsule (300 mg total) by mouth 3 (three) times daily.   lansoprazole (PREVACID) 30 MG capsule Take 1 capsule (30 mg total) by mouth 2 (two) times daily before a meal for 14 days.   montelukast (SINGULAIR) 10 MG tablet Take 1 tablet (10 mg total) by mouth at bedtime.   oxybutynin (DITROPAN XL) 15 MG 24 hr tablet Take 1 tablet (15 mg total) by mouth daily.   predniSONE (DELTASONE) 10 MG tablet Day 1 take 6 tablets Day 2 take 6 tablets Day 3 take 5 tablets Day 4 take 5 tablets Day 5 take 4 tablets Day 6 take 4 tablets Day 7 take 3 tablets Day 8 take 3 tablets Day 9 take 2 tablets Day 10 take 2 tablets Day 11 take 1 tablet Day 12 take 1 tablet Day 13 take 1/2 tablet Day 14 take 1/2 tablet   rOPINIRole (REQUIP) 0.25 MG tablet Take 1 tablet (0.25 mg total) by mouth at bedtime.   Facility-Administered Medications Prior to Visit  Medication Dose Route Frequency Provider   cyanocobalamin ((VITAMIN B-12)) injection 1,000 mcg  1,000 mcg Intramuscular Q30 days Sindy Guadeloupe, MD   cyanocobalamin ((VITAMIN B-12)) injection 1,000 mcg  1,000 mcg Intramuscular Q30 days Sindy Guadeloupe, MD    Review of Systems  Gastrointestinal:  Positive for abdominal pain.    {  Labs  Heme  Chem  Endocrine  Serology  Results Review (optional):23779}   Objective    There were no vitals taken for this visit. {Show previous vital signs (optional):23777}  Physical Exam  ***  No results found for any visits on 11/08/21.  Assessment & Plan     ***  No follow-ups on file.      {provider attestation***:1}   Mardene Speak, Hershal Coria  [facility] [phone] (phone) [phone] (fax)  [place]

## 2021-11-11 NOTE — Progress Notes (Unsigned)
Established patient visit   Patient: Denise Watts   DOB: 1955-05-03   66 y.o. Female  MRN: [id number] Visit Date: 11/17/2021  Today's healthcare provider: [name], FNP   No chief complaint on file.  Subjective    HPI  COPD, Follow up  She was last seen for this 2 weeks ago. Changes made include continue medication.   She reports {excellent/good/fair/poor:19665} compliance with treatment. She {IS/IS UXL:24401027} having side effects. *** she uses rescue inhaler {NUMBERS 1-12:18279} per {days/wks/mos/yrs:310907}. She IS experiencing {Symptoms; copd:17792}. She is NOT experiencing {Symptoms; copd:17792:o}. she reports breathing is {improved/worse/unchanged:3041574}.  Pulmonary Functions Testing Results:  No results found for: "FEV1", "FVC", "FEV1FVC", "TLC"  -----------------------------------------------------------------------------------------   Medications: Outpatient Medications Prior to Visit  Medication Sig   albuterol (VENTOLIN HFA) 108 (90 Base) MCG/ACT inhaler Inhale 2 puffs into the lungs every 6 (six) hours as needed for shortness of breath.   amoxicillin (AMOXIL) 500 MG capsule Take 2 capsules (1,000 mg total) by mouth 2 (two) times daily for 14 days.   aspirin EC 81 MG tablet Take 1 tablet (81 mg total) by mouth daily. Swallow whole.   benzonatate (TESSALON) 100 MG capsule Take 2 capsules (200 mg total) by mouth 3 (three) times daily.   brompheniramine-pseudoephedrine-DM 30-2-10 MG/5ML syrup Take 5 mLs by mouth 4 (four) times daily as needed.   Budeson-Glycopyrrol-Formoterol (BREZTRI AEROSPHERE) 160-9-4.8 MCG/ACT AERO Inhale 2 puffs into the lungs in the morning and at bedtime.   buPROPion (WELLBUTRIN) 75 MG tablet Take 75 mg by mouth 2 (two) times daily.   busPIRone (BUSPAR) 5 MG tablet Take 1 tablet (5 mg total) by mouth 2 (two) times daily.   clarithromycin (BIAXIN) 500 MG tablet Take 1 tablet (500 mg total) by mouth 2 (two) times daily for 14  days.   cyclobenzaprine (FLEXERIL) 5 MG tablet Take 1 tablet (5 mg total) by mouth at bedtime.   DULoxetine (CYMBALTA) 60 MG capsule Take 1 capsule (60 mg total) by mouth daily.   fluconazole (DIFLUCAN) 150 MG tablet Take one by mouth for vaginal yeast; repeat in 4 days if symptoms remain   fluticasone (FLONASE) 50 MCG/ACT nasal spray Place 1 spray into both nostrils at bedtime.   gabapentin (NEURONTIN) 300 MG capsule Take 1 capsule (300 mg total) by mouth 3 (three) times daily.   lansoprazole (PREVACID) 30 MG capsule Take 1 capsule (30 mg total) by mouth 2 (two) times daily before a meal for 14 days.   montelukast (SINGULAIR) 10 MG tablet Take 1 tablet (10 mg total) by mouth at bedtime.   oxybutynin (DITROPAN XL) 15 MG 24 hr tablet Take 1 tablet (15 mg total) by mouth daily.   predniSONE (DELTASONE) 10 MG tablet Day 1 take 6 tablets Day 2 take 6 tablets Day 3 take 5 tablets Day 4 take 5 tablets Day 5 take 4 tablets Day 6 take 4 tablets Day 7 take 3 tablets Day 8 take 3 tablets Day 9 take 2 tablets Day 10 take 2 tablets Day 11 take 1 tablet Day 12 take 1 tablet Day 13 take 1/2 tablet Day 14 take 1/2 tablet   rOPINIRole (REQUIP) 0.25 MG tablet Take 1 tablet (0.25 mg total) by mouth at bedtime.   Facility-Administered Medications Prior to Visit  Medication Dose Route Frequency Provider   cyanocobalamin ((VITAMIN B-12)) injection 1,000 mcg  1,000 mcg Intramuscular Q30 days Sindy Guadeloupe, MD   cyanocobalamin ((VITAMIN B-12)) injection 1,000 mcg  1,000 mcg  Intramuscular Q30 days Sindy Guadeloupe, MD    Review of Systems  {Labs  Heme  Chem  Endocrine  Serology  Results Review (optional):23779}   Objective    There were no vitals taken for this visit. {Show previous vital signs (optional):23777}  Physical Exam  ***  No results found for any visits on 11/17/21.  Assessment & Plan     ***  No follow-ups on file.      {provider attestation***:1}   [name], [place] [phone] (phone) [phone] (fax)  [place]

## 2021-11-16 ENCOUNTER — Ambulatory Visit
Admission: RE | Admit: 2021-11-16 | Discharge: 2021-11-16 | Disposition: A | Discharge status: Home / Self Care | Payer: Medicare HMO | Source: Ambulatory Visit | Attending: Family Medicine | Admitting: Family Medicine

## 2021-11-16 DIAGNOSIS — N281 Cyst of kidney, acquired: Secondary | ICD-10-CM | POA: Diagnosis not present

## 2021-11-16 DIAGNOSIS — R1084 Generalized abdominal pain: Secondary | ICD-10-CM | POA: Insufficient documentation

## 2021-11-17 ENCOUNTER — Ambulatory Visit
Admission: RE | Admit: 2021-11-17 | Discharge: 2021-11-17 | Disposition: A | Discharge status: Home / Self Care | Payer: Medicare HMO | Attending: Family Medicine | Admitting: Family Medicine

## 2021-11-17 ENCOUNTER — Ambulatory Visit (INDEPENDENT_AMBULATORY_CARE_PROVIDER_SITE_OTHER): Payer: Medicare HMO | Admitting: Family Medicine

## 2021-11-17 ENCOUNTER — Encounter: Payer: Self-pay | Admitting: Family Medicine

## 2021-11-17 ENCOUNTER — Other Ambulatory Visit: Payer: Self-pay | Admitting: Family Medicine

## 2021-11-17 ENCOUNTER — Ambulatory Visit
Admission: RE | Admit: 2021-11-17 | Discharge: 2021-11-17 | Disposition: A | Discharge status: Home / Self Care | Payer: Medicare HMO | Source: Ambulatory Visit | Attending: Family Medicine | Admitting: Family Medicine

## 2021-11-17 VITALS — BP 93/63 | HR 111 | Temp 98.0°F | Resp 20 | Wt 140.5 lb

## 2021-11-17 DIAGNOSIS — R1084 Generalized abdominal pain: Secondary | ICD-10-CM | POA: Insufficient documentation

## 2021-11-17 DIAGNOSIS — J449 Chronic obstructive pulmonary disease, unspecified: Secondary | ICD-10-CM | POA: Diagnosis not present

## 2021-11-17 DIAGNOSIS — R0602 Shortness of breath: Secondary | ICD-10-CM | POA: Diagnosis not present

## 2021-11-17 DIAGNOSIS — H6091 Unspecified otitis externa, right ear: Secondary | ICD-10-CM | POA: Diagnosis not present

## 2021-11-17 DIAGNOSIS — J441 Chronic obstructive pulmonary disease with (acute) exacerbation: Secondary | ICD-10-CM

## 2021-11-17 DIAGNOSIS — K439 Ventral hernia without obstruction or gangrene: Secondary | ICD-10-CM

## 2021-11-17 DIAGNOSIS — R911 Solitary pulmonary nodule: Secondary | ICD-10-CM

## 2021-11-17 MED ORDER — ALUM & MAG HYDROXIDE-SIMETH 400-400-40 MG/5ML PO SUSP: 15.0000 mL | Freq: Four times a day (QID) | ORAL | 0 refills | Status: DC | PRN

## 2021-11-17 NOTE — Progress Notes (Signed)
No lung edema. Improvement in bases. 1 cm nodule; orders placed for lung CT. No fluid on lungs or collapse.  [name], [address] [place], [place] [address] [phone] (phone) [phone] (fax) [place]

## 2021-11-17 NOTE — Assessment & Plan Note (Signed)
Improving; associated with mild dizziness Continue to encourage hydration and slow movement Denies falls Refer back to ENT

## 2021-11-17 NOTE — Assessment & Plan Note (Signed)
Chronic, reports 1/week constipation Request for "pain pills" to assist Defer request given hypotension, hx of constipation and report of dizziness Encourage use of OTC medication and dietary modifications  Referral to general surgery for surgical consult

## 2021-11-17 NOTE — Assessment & Plan Note (Signed)
Lungs have improved; patient continues to present without O2 Reports some use of O2 at home Refer back to pulm as pt has not taken to scheduling her appt Hold off on additional steroid/Abx at this time given taper is completed today and abx was held d/t GI cocktail from outside provider for concern for GI symptoms

## 2021-11-17 NOTE — Assessment & Plan Note (Signed)
Currently finishing GI cocktail from UC; continues to have complaints [place] benign; recommend use of Maalox Plus to assist with gas and constipation  Referral to GI for colonoscopy and further Hpylori mgmt via bx

## 2021-11-19 ENCOUNTER — Other Ambulatory Visit: Payer: Self-pay | Admitting: Family Medicine

## 2021-11-19 DIAGNOSIS — R070 Pain in throat: Secondary | ICD-10-CM | POA: Diagnosis not present

## 2021-11-19 DIAGNOSIS — R682 Dry mouth, unspecified: Secondary | ICD-10-CM | POA: Diagnosis not present

## 2021-11-19 DIAGNOSIS — M26629 Arthralgia of temporomandibular joint, unspecified side: Secondary | ICD-10-CM | POA: Diagnosis not present

## 2021-11-19 DIAGNOSIS — H9203 Otalgia, bilateral: Secondary | ICD-10-CM | POA: Diagnosis not present

## 2021-11-19 NOTE — Telephone Encounter (Signed)
Requested medication (s) are due for refill today: yes  Requested medication (s) are on the active medication list: yes  Last refill:  10/26/21 #30  Future visit scheduled: no  Notes to clinic:  med not delegated to NT to RF   Requested Prescriptions  Pending Prescriptions Disp Refills   cyclobenzaprine (FLEXERIL) 5 MG tablet [Pharmacy Med Name: CYCLOBENZAPRINE HCL 5 MG TAB] 30 tablet 0    Sig: TAKE 1 TABLET BY MOUTH AT BEDTIME     Not Delegated - Analgesics:  Muscle Relaxants Failed - 11/19/2021 12:22 PM      Failed - This refill cannot be delegated      Passed - Valid encounter within last 6 months    Recent Outpatient Visits           2 days ago Ventral hernia without obstruction or gangrene   [facility] [name], FNP   1 week ago Generalized abdominal pain   [facility] Jerrol Banana., MD   2 weeks ago COPD, frequent exacerbations [facility])   [facility] [name], FNP   1 month ago [place], [place], PA-C   1 month ago Sore throat   [facility] [place], [place], [place]

## 2021-11-22 ENCOUNTER — Telehealth: Payer: Medicare HMO

## 2021-11-25 ENCOUNTER — Ambulatory Visit
Admission: RE | Admit: 2021-11-25 | Discharge: 2021-11-25 | Disposition: A | Discharge status: Home / Self Care | Payer: Medicare HMO | Source: Ambulatory Visit | Attending: Family Medicine | Admitting: Family Medicine

## 2021-11-25 DIAGNOSIS — R911 Solitary pulmonary nodule: Secondary | ICD-10-CM | POA: Diagnosis not present

## 2021-11-25 DIAGNOSIS — J439 Emphysema, unspecified: Secondary | ICD-10-CM | POA: Diagnosis not present

## 2021-11-25 DIAGNOSIS — R0600 Dyspnea, unspecified: Secondary | ICD-10-CM | POA: Diagnosis not present

## 2021-11-25 MED ORDER — IOHEXOL 300 MG/ML  SOLN
75.0000 mL | Freq: Once | INTRAMUSCULAR | Status: AC | PRN
  Administered 2021-11-25: 75 mL via INTRAVENOUS

## 2021-11-29 ENCOUNTER — Telehealth: Payer: Self-pay

## 2021-11-29 ENCOUNTER — Encounter: Payer: Self-pay | Admitting: Surgery

## 2021-11-29 ENCOUNTER — Other Ambulatory Visit: Payer: Self-pay

## 2021-11-29 ENCOUNTER — Other Ambulatory Visit: Payer: Self-pay | Admitting: Family Medicine

## 2021-11-29 ENCOUNTER — Ambulatory Visit: Payer: Medicare HMO | Admitting: Surgery

## 2021-11-29 VITALS — BP 124/85 | HR 98 | Temp 98.3°F | Ht 61.0 in | Wt 141.0 lb

## 2021-11-29 DIAGNOSIS — R1310 Dysphagia, unspecified: Secondary | ICD-10-CM | POA: Diagnosis not present

## 2021-11-29 DIAGNOSIS — R1084 Generalized abdominal pain: Secondary | ICD-10-CM | POA: Diagnosis not present

## 2021-11-29 DIAGNOSIS — R911 Solitary pulmonary nodule: Secondary | ICD-10-CM

## 2021-11-29 DIAGNOSIS — R109 Unspecified abdominal pain: Secondary | ICD-10-CM

## 2021-11-29 NOTE — Patient Instructions (Addendum)
Please have all test done priro to seeing Dr.Pabon 01/05/2022. If you do not have all test done -please reschedule your appointment.   CT scheduled @ [facility] on 12/09/21 @ 10:15.Please pick up prep kit today. Nothing to eat/drink 4 hours prior to this scan.  Barium Swallow study scheduled 12/16/21 @ 8:30 am. Nothing to eat or drink 4 hours prior.  Pulmonary Clearance faxed to Dr. Aleskerov at this time.   Referral sent to Dr.Wohl. @ [facility]. Their number is [phone]. Someone from their office will call to schedule an appointment. If you do not hear from their office please call their office within a few days.   

## 2021-11-29 NOTE — Telephone Encounter (Signed)
Pulmonary Clearance faxed to Dr.Fuad Aleskerov at this time.

## 2021-11-29 NOTE — Progress Notes (Signed)
Soft tissue mass remains, 13 x 12 mm. Not increased in size from previous scan 01/2021; recommend repeat CT in 6 months.  Blockages noted in both the aorta as well as chronic emphysema, including trace fluid around the heart.  Please let us know if you have any questions.  Thank you, [name], [address] [place], [place] [address] [phone] (phone) [phone] (fax) [place]

## 2021-12-01 ENCOUNTER — Encounter: Payer: Self-pay | Admitting: Surgery

## 2021-12-01 NOTE — Progress Notes (Signed)
Patient ID: Denise Watts, female   DOB: 12/26/1955, 66 y.o.   MRN: [id number]  HPI Denise Watts is a 66 y.o. female seen in consultation at the request of Mrs. Rollene Rotunda NP for questionable recurrent ventral hernia.  She did have a laparoscopic ventral hernia repair in 2018 by Dr. Delcie Roch.  Please note that I personally reviewed the records.  She now continues to have some recurrent intermittent abdominal pain.  The abdominal pain is in the abdominal wall worsening with certain movements.  Pain is moderate dull.  No specific alleviating factors.  In addition to this the patient reports some reflux as well as some dysphagia.  Of note the patient had a history of breast cancer status post lobectomy, XRT and chemotherapy.  She also had prior EGDs by Dr.Wohl requiring esophageal dilation. He had a recent CT of the chest that have personally reviewed showing some pulmonary nodules that is not changed. She did have an ultrasound that I personally reviewed showing no evidence of gallstones. She had a normal CBC and CMP. 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   Cough 03/02/2017   INTERMTTENT DRY COUGH   Depression    GERD (gastroesophageal reflux disease)    History of hiatal hernia    Personal history of chemotherapy    Personal history of radiation therapy    Skin cancer    Throat cancer ([place]) 1998   radiation    Past Surgical History:  Procedure Laterality Date   BREAST BIOPSY Right 1997   positive   BREAST BIOPSY Right 11/15/2017   INVASIVE MAMMARY CARCINOMA triple negative   BREAST BIOPSY Right 07/20/2020   [place] bx, Q marker, neg   BREAST LUMPECTOMY Right 1997   2019 also   BREAST LUMPECTOMY WITH SENTINEL LYMPH  NODE BIOPSY Right 12/08/2017   Procedure: BREAST LUMPECTOMY WITH SENTINEL LYMPH NODE BX;  Surgeon: Robert Bellow, MD;  Location: [facility];  Service: General;  Laterality: Right;   BREAST REDUCTION WITH MASTOPEXY Left 06/10/2019   Procedure: Left breast mastopexy reduction for symmetry;  Surgeon: Wallace Going, DO;  Location: [place];  Service: Plastics;  Laterality: Left;  total 2.5 hours   COLONOSCOPY  2015   COLONOSCOPY WITH PROPOFOL N/A 10/29/2019   Procedure: COLONOSCOPY WITH PROPOFOL;  Surgeon: Lucilla Lame, MD;  Location[facility];  Service: Endoscopy;  Laterality: N/A;   ESOPHAGOGASTRODUODENOSCOPY (EGD) WITH PROPOFOL N/A 05/24/2019   Procedure: ESOPHAGOGASTRODUODENOSCOPY (EGD) WITH PROPOFOL;  Surgeon: Lucilla Lame, MD;  Location: [facility] ENDOSCOPY;  Service: Endoscopy;  Laterality: N/A;   ESOPHAGUS SURGERY     KNEE SURGERY Right    LIPOSUCTION WITH LIPOFILLING Right 06/10/2019   Procedure: right breast scar and capsule release with fat grafting;  Surgeon: Wallace Going, DO;  Location: [place];  Service: Plastics;  Laterality: Right;   PORT-A-CATH REMOVAL     PORTACATH PLACEMENT Right 01/17/2018   Procedure: INSERTION PORT-A-CATH- RIGHT;  Surgeon: Robert Bellow, MD;  Location: [facility];  Service: General;  Laterality: Right;   ROBOTIC ADRENALECTOMY Right 12/28/2016   Procedure: ROBOTIC ADRENALECTOMY;  Surgeon: Hollice Espy, MD;  Location: [facility];  Service: Urology;  Laterality: Right;   SKIN CANCER EXCISION     THROAT SURGERY  1998   throat cancer    TUBAL LIGATION     VENTRAL HERNIA REPAIR N/A 03/03/2017   Procedure: HERNIA REPAIR VENTRAL ADULT;  Surgeon: Clayburn Pert, MD;  Location: [facility];  Service: General;  Laterality: N/A;    Family History  Problem Relation Age of Onset   Diabetes Mother    Heart attack Mother    Cancer Sister        cancer of eyes, spread to lung, other places. died at 71   Cancer  Sister 58       unknown primary, spread to bone, brain died in 70's   Cancer Paternal Aunt        unk type   Cancer Paternal Grandmother        type unk   Prostate cancer Neg Hx    Kidney cancer Neg Hx    Bladder Cancer Neg Hx     Social History Social History   Tobacco Use   Smoking status: Former    Packs/day: 1.50    Years: 30.00    Total pack years: 45.00    Types: Cigarettes    Quit date: 05/03/1995    Years since quitting: 26.6   Smokeless tobacco: Never  Vaping Use   Vaping Use: Never used  Substance Use Topics   Alcohol use: Yes    Alcohol/week: 4.0 standard drinks of alcohol    Types: 4 Shots of liquor per week    Comment: none   Drug use: No    Allergies  Allergen Reactions   Sulfa Antibiotics Other (See Comments)    Mouth sore and raw    Current Outpatient Medications  Medication Sig Dispense Refill   albuterol (VENTOLIN HFA) 108 (90 Base) MCG/ACT inhaler Inhale 2 puffs into the lungs every 6 (six) hours as needed for shortness of breath. 6.7 g 0   alum & mag hydroxide-simeth (MAALOX PLUS) 400-400-40 MG/5ML suspension Take 15 mLs by mouth every 6 (six) hours as needed for indigestion. 355 mL 0   atorvastatin (LIPITOR) 40 MG tablet Take 40 mg by mouth daily.     benzonatate (TESSALON) 100 MG capsule Take 2 capsules (200 mg total) by mouth 3 (three) times daily. 180 capsule 0   brompheniramine-pseudoephedrine-DM 30-2-10 MG/5ML syrup Take 5 mLs by mouth 4 (four) times daily as needed. 120 mL 0   Budeson-Glycopyrrol-Formoterol (BREZTRI AEROSPHERE) 160-9-4.8 MCG/ACT AERO Inhale 2 puffs into the lungs in the morning and at bedtime. 5.9 g 5   buPROPion (WELLBUTRIN) 75 MG tablet Take 75 mg by mouth 2 (two) times daily.     busPIRone (BUSPAR) 5 MG tablet Take 1 tablet (5 mg total) by mouth 2 (two) times daily. 180 tablet 3   cyclobenzaprine (FLEXERIL) 5 MG tablet TAKE 1 TABLET BY MOUTH AT BEDTIME 30 tablet 0   DULoxetine (CYMBALTA) 60 MG capsule Take 1 capsule (60 mg  total) by mouth daily. 90 capsule 3   fluconazole (DIFLUCAN) 150 MG tablet Take one by mouth for vaginal yeast; repeat in 4 days if symptoms remain 1 tablet 0   fluticasone (FLONASE) 50 MCG/ACT nasal spray Place 1 spray into both nostrils at bedtime. 16 g 6   gabapentin (NEURONTIN) 300 MG capsule Take 1 capsule (300 mg total) by mouth 3 (three) times daily. 60 capsule 5   montelukast (SINGULAIR) 10 MG tablet Take 1 tablet (10 mg total) by mouth  at bedtime. 90 tablet 3   oxybutynin (DITROPAN XL) 15 MG 24 hr tablet Take 1 tablet (15 mg total) by mouth daily. 30 tablet 11   rOPINIRole (REQUIP) 0.25 MG tablet Take 1 tablet (0.25 mg total) by mouth at bedtime. 90 tablet 1   No current facility-administered medications for this visit.   Facility-Administered Medications Ordered in Other Visits  Medication Dose Route Frequency Provider Last Rate Last Admin   cyanocobalamin ((VITAMIN B-12)) injection 1,000 mcg  1,000 mcg Intramuscular Q30 days Sindy Guadeloupe, MD   1,000 mcg at 08/16/19 1144   cyanocobalamin ((VITAMIN B-12)) injection 1,000 mcg  1,000 mcg Intramuscular Q30 days Sindy Guadeloupe, MD   1,000 mcg at 08/27/21 1537     Review of Systems Full ROS  was asked and was negative except for the information on the HPI  Physical Exam Blood pressure 124/85, pulse 98, temperature 98.3 F (36.8 C), temperature source Oral, height '5\' 1"'$  (1.549 m), weight 141 lb (64 kg), SpO2 97 %. CONSTITUTIONAL: NAD. EYES: Pupils are equal, round,  Sclera are non-icteric. EARS, NOSE, MOUTH AND THROAT: The oral mucosa is pink and moist. Hearing is intact to voice. LYMPH NODES:  Lymph nodes in the neck are normal. RESPIRATORY:  Lungs arsome wheezes. There is normal respiratory effort, with equal breath sounds bilaterally, and without pathologic use of accessory muscles. CARDIOVASCULAR: Heart is regular without murmurs, gallops, or rubs. GI: The abdomen is  soft, mild diffuse tenderness.  There is no obvious abdominal  wall defects.  There are no palpable masses. There is no hepatosplenomegaly. There are normal bowel sounds in all quadrants. GU: Rectal deferred.   MUSCULOSKELETAL: Normal muscle strength and tone. No cyanosis or edema.   SKIN: Turgor is good and there are no pathologic skin lesions or ulcers. NEUROLOGIC: Motor and sensation is grossly normal. Cranial nerves are grossly intact. PSYCH:  Oriented to person, place and time. Affect is normal.  Data Reviewed  I have personally reviewed the patient's imaging, laboratory findings and medical records.    Assessment/Plan 66 year old female with subacute abdominal pain within the abdominal wall associated with dysphagia and reflux symptoms.  I do think that she does have 2 separate issues.  She definitely needs further work-up to potentially exclude a hernia.  I will order a CT scan of the abdomen pelvis and I will also make sure that she has another endoscopic evaluation by GI since she may need dilation and further work-up.  Discussed with her in detail about her disease process. Is not a spent greater than 60 minutes in this encounter including personally reviewing imaging studies, coordinating care, placing orders and performing appropriate documentation  Caroleen Hamman, MD [place] Surgeon 12/01/2021, 10:55 AM

## 2021-12-02 ENCOUNTER — Ambulatory Visit (INDEPENDENT_AMBULATORY_CARE_PROVIDER_SITE_OTHER): Payer: Medicare HMO

## 2021-12-02 ENCOUNTER — Encounter: Payer: Self-pay | Admitting: Family Medicine

## 2021-12-02 ENCOUNTER — Ambulatory Visit (INDEPENDENT_AMBULATORY_CARE_PROVIDER_SITE_OTHER): Payer: Medicare HMO | Admitting: Family Medicine

## 2021-12-02 VITALS — Wt 141.0 lb

## 2021-12-02 VITALS — BP 101/52 | HR 100 | Temp 98.3°F | Resp 16 | Wt 141.9 lb

## 2021-12-02 DIAGNOSIS — I3139 Other pericardial effusion (noninflammatory): Secondary | ICD-10-CM | POA: Insufficient documentation

## 2021-12-02 DIAGNOSIS — Z0000 Encounter for general adult medical examination without abnormal findings: Secondary | ICD-10-CM

## 2021-12-02 DIAGNOSIS — R0609 Other forms of dyspnea: Secondary | ICD-10-CM | POA: Diagnosis not present

## 2021-12-02 DIAGNOSIS — H66004 Acute suppurative otitis media without spontaneous rupture of ear drum, recurrent, right ear: Secondary | ICD-10-CM | POA: Insufficient documentation

## 2021-12-02 DIAGNOSIS — Z9889 Other specified postprocedural states: Secondary | ICD-10-CM | POA: Diagnosis not present

## 2021-12-02 DIAGNOSIS — H2513 Age-related nuclear cataract, bilateral: Secondary | ICD-10-CM | POA: Diagnosis not present

## 2021-12-02 DIAGNOSIS — Z0100 Encounter for examination of eyes and vision without abnormal findings: Secondary | ICD-10-CM | POA: Diagnosis not present

## 2021-12-02 DIAGNOSIS — H43813 Vitreous degeneration, bilateral: Secondary | ICD-10-CM | POA: Diagnosis not present

## 2021-12-02 MED ORDER — AZITHROMYCIN 250 MG PO TABS: ORAL_TABLET | ORAL | 0 refills | Status: AC

## 2021-12-02 MED ORDER — PREDNISONE 10 MG PO TABS: ORAL_TABLET | ORAL | 0 refills | Status: DC

## 2021-12-02 MED ORDER — GUAIFENESIN-DM 100-10 MG/5ML PO SYRP: 5.0000 mL | ORAL_SOLUTION | ORAL | 0 refills | Status: DC | PRN

## 2021-12-02 MED ORDER — GUAIFENESIN-CODEINE 100-10 MG/5ML PO SOLN: 10.0000 mL | Freq: Every evening | ORAL | 0 refills | Status: DC | PRN

## 2021-12-02 MED ORDER — DOXYCYCLINE HYCLATE 100 MG PO TABS: 100.0000 mg | ORAL_TABLET | Freq: Two times a day (BID) | ORAL | 0 refills | Status: DC

## 2021-12-02 NOTE — Assessment & Plan Note (Addendum)
Acute on chronic, reports worsening cough LCTAB; rhonchi in R base cleared with forced cough Encourage use of increased water with guaifenesin  Upcoming appt with pulm

## 2021-12-02 NOTE — Assessment & Plan Note (Signed)
Failed to respond to Levaquin; recommend use of dual agent medication  Continue to use suppurative medications to assist Continue to drink water daily.  Goal of 64 oz/day minimum.  Can use OTC allergy medication, ex Zyrtec as well as nasal steroid, ex Flonase.  If you are congested you can use Saline Nasal Spray.  Use saline prior to using Flonase if needed.  You can use medications for cough like Delsym and for mucus, like Mucinex.  Can add lozenges or drink tea with local honey to help relieve symptoms.

## 2021-12-02 NOTE — Progress Notes (Signed)
Virtual Visit via Telephone Note  I connected with  Denise Watts on 12/02/21 at  1:30 PM EDT by telephone and verified that I am speaking with the correct person using two identifiers.  Location: Patient: home Provider: [facility] Persons participating in the virtual visit: [place]   I discussed the limitations, risks, security and privacy concerns of performing an evaluation and management service by telephone and the availability of in person appointments. The patient expressed understanding and agreed to proceed.  Interactive audio and video telecommunications were attempted between this nurse and patient, however failed, due to patient having technical difficulties OR patient did not have access to video capability.  We continued and completed visit with audio only.  Some vital signs may be absent or patient reported.   Dionisio David, LPN  Subjective:   Denise Watts is a 66 y.o. female who presents for Medicare Annual (Subsequent) preventive examination.  Review of Systems     Cardiac Risk Factors include: advanced age (>62mn, >>39women)     Objective:    There were no vitals filed for this visit. There is no height or weight on file to calculate BMI.     12/02/2021    1:49 PM 09/14/2021    1:00 PM 09/09/2021    1:00 PM 09/01/2021   10:38 AM 08/29/2021    9:44 PM 08/27/2021    2:46 PM 03/10/2021    1:46 PM  Advanced Directives  Does Patient Have a Medical Advance Directive? No No No No No No No  Would patient like information on creating a medical advance directive? No - Patient declined No - Patient declined No - Patient declined  No - Patient declined No - Patient declined No - Patient declined    Current Medications (verified) Outpatient Encounter Medications as of 12/02/2021  Medication Sig   albuterol (VENTOLIN HFA) 108 (90 Base) MCG/ACT inhaler Inhale 2 puffs into the lungs every 6 (six) hours as needed for shortness of breath.   alum & mag  hydroxide-simeth (MAALOX PLUS) 400-400-40 MG/5ML suspension Take 15 mLs by mouth every 6 (six) hours as needed for indigestion.   atorvastatin (LIPITOR) 40 MG tablet Take 40 mg by mouth daily.   benzonatate (TESSALON) 100 MG capsule Take 2 capsules (200 mg total) by mouth 3 (three) times daily.   brompheniramine-pseudoephedrine-DM 30-2-10 MG/5ML syrup Take 5 mLs by mouth 4 (four) times daily as needed.   Budeson-Glycopyrrol-Formoterol (BREZTRI AEROSPHERE) 160-9-4.8 MCG/ACT AERO Inhale 2 puffs into the lungs in the morning and at bedtime.   buPROPion (WELLBUTRIN) 75 MG tablet Take 75 mg by mouth 2 (two) times daily.   busPIRone (BUSPAR) 5 MG tablet Take 1 tablet (5 mg total) by mouth 2 (two) times daily.   cyclobenzaprine (FLEXERIL) 5 MG tablet TAKE 1 TABLET BY MOUTH AT BEDTIME   DULoxetine (CYMBALTA) 60 MG capsule Take 1 capsule (60 mg total) by mouth daily.   fluconazole (DIFLUCAN) 150 MG tablet Take one by mouth for vaginal yeast; repeat in 4 days if symptoms remain   fluticasone (FLONASE) 50 MCG/ACT nasal spray Place 1 spray into both nostrils at bedtime.   gabapentin (NEURONTIN) 300 MG capsule Take 1 capsule (300 mg total) by mouth 3 (three) times daily.   montelukast (SINGULAIR) 10 MG tablet Take 1 tablet (10 mg total) by mouth at bedtime.   oxybutynin (DITROPAN XL) 15 MG 24 hr tablet Take 1 tablet (15 mg total) by mouth daily.   rOPINIRole (REQUIP) 0.25  MG tablet Take 1 tablet (0.25 mg total) by mouth at bedtime.   Facility-Administered Encounter Medications as of 12/02/2021  Medication   cyanocobalamin ((VITAMIN B-12)) injection 1,000 mcg   cyanocobalamin ((VITAMIN B-12)) injection 1,000 mcg    Allergies (verified) Sulfa antibiotics   History: Past Medical History:  Diagnosis Date   Anxiety    Arthritis    KNEE RIGHT   Breast cancer ([place]) 1997   right breast lumpectomy with radiation   Breast cancer ([place]) 11/15/2017   INVASIVE MAMMARY CARCINOMA/ triple negative   Cancer of  vocal cord ([facility]) 11/10/2014   Cough 03/02/2017   INTERMTTENT DRY COUGH   Depression    GERD (gastroesophageal reflux disease)    History of hiatal hernia    Personal history of chemotherapy    Personal history of radiation therapy    Skin cancer    Throat cancer ([place]) 1998   radiation   Past Surgical History:  Procedure Laterality Date   BREAST BIOPSY Right 1997   positive   BREAST BIOPSY Right 11/15/2017   INVASIVE MAMMARY CARCINOMA triple negative   BREAST BIOPSY Right 07/20/2020   [place] bx, Q marker, neg   BREAST LUMPECTOMY Right 1997   2019 also   BREAST LUMPECTOMY WITH SENTINEL LYMPH NODE BIOPSY Right 12/08/2017   Procedure: BREAST LUMPECTOMY WITH SENTINEL LYMPH NODE BX;  Surgeon: Robert Bellow, MD;  Location: [facility];  Service: General;  Laterality: Right;   BREAST REDUCTION WITH MASTOPEXY Left 06/10/2019   Procedure: Left breast mastopexy reduction for symmetry;  Surgeon: Wallace Going, DO;  Location: [place];  Service: Plastics;  Laterality: Left;  total 2.5 hours   COLONOSCOPY  2015   COLONOSCOPY WITH PROPOFOL N/A 10/29/2019   Procedure: COLONOSCOPY WITH PROPOFOL;  Surgeon: Lucilla Lame, MD;  Location: [facility] ENDOSCOPY;  Service: Endoscopy;  Laterality: N/A;   ESOPHAGOGASTRODUODENOSCOPY (EGD) WITH PROPOFOL N/A 05/24/2019   Procedure: ESOPHAGOGASTRODUODENOSCOPY (EGD) WITH PROPOFOL;  Surgeon: Lucilla Lame, MD;  Location: [facility] ENDOSCOPY;  Service: Endoscopy;  Laterality: N/A;   ESOPHAGUS SURGERY     KNEE SURGERY Right    LIPOSUCTION WITH LIPOFILLING Right 06/10/2019   Procedure: right breast scar and capsule release with fat grafting;  Surgeon: Wallace Going, DO;  Location: [place];  Service: Plastics;  Laterality: Right;   PORT-A-CATH REMOVAL     PORTACATH PLACEMENT Right 01/17/2018   Procedure: INSERTION PORT-A-CATH- RIGHT;  Surgeon: Robert Bellow, MD;  Location: [facility] [facility];  Service: General;  Laterality: Right;    ROBOTIC ADRENALECTOMY Right 12/28/2016   Procedure: ROBOTIC ADRENALECTOMY;  Surgeon: Hollice Espy, MD;  Location: [facility];  Service: Urology;  Laterality: Right;   SKIN CANCER EXCISION     THROAT SURGERY  1998   throat cancer    TUBAL LIGATION     VENTRAL HERNIA REPAIR N/A 03/03/2017   Procedure: HERNIA REPAIR VENTRAL ADULT;  Surgeon: Clayburn Pert, MD;  Location: [facility];  Service: General;  Laterality: N/A;   Family History  Problem Relation Age of Onset   Diabetes Mother    Heart attack Mother    Cancer Sister        cancer of eyes, spread to lung, other places. died at 79   Cancer Sister 106       unknown primary, spread to bone, brain died in 18's   Cancer Paternal Aunt        unk type   Cancer Paternal Grandmother  type unk   Prostate cancer Neg Hx    Kidney cancer Neg Hx    Bladder Cancer Neg Hx    Social History   Socioeconomic History   Marital status: Married    Spouse name: Not on file   Number of children: 4   Years of education: Not on file   Highest education level: 10th grade  Occupational History   Occupation: retired  Tobacco Use   Smoking status: Former    Packs/day: 1.50    Years: 30.00    Total pack years: 45.00    Types: Cigarettes    Quit date: 05/03/1995    Years since quitting: 26.6   Smokeless tobacco: Never  Vaping Use   Vaping Use: Never used  Substance and Sexual Activity   Alcohol use: Yes    Alcohol/week: 4.0 standard drinks of alcohol    Types: 4 Shots of liquor per week    Comment: none   Drug use: No   Sexual activity: Not Currently    Birth control/protection: Post-menopausal  Other Topics Concern   Not on file  Social History Narrative   Not on file   Social Determinants of Health   Financial Resource Strain: Low Risk  (12/02/2021)   Overall Financial Resource Strain (CARDIA)    Difficulty of Paying Living Expenses: Not very hard  Food Insecurity: No Food Insecurity (12/02/2021)   Hunger Vital Sign    Worried  About Running Out of Food in the Last Year: Never true    Ran Out of Food in the Last Year: Never true  Transportation Needs: No Transportation Needs (12/02/2021)   PRAPARE - Hydrologist (Medical): No    Lack of Transportation (Non-Medical): No  Physical Activity: Insufficiently Active (12/02/2021)   Exercise Vital Sign    Days of Exercise per Week: 3 days    Minutes of Exercise per Session: 30 min  Stress: Stress Concern Present (12/02/2021)   [place]    Feeling of Stress : To some extent  Social Connections: Moderately Isolated (12/02/2021)   Social Connection and Isolation Panel [NHANES]    Frequency of Communication with Friends and Family: Three times a week    Frequency of Social Gatherings with Friends and Family: Twice a week    Attends Religious Services: Never    Marine scientist or Organizations: No    Attends Music therapist: Never    Marital Status: Married    Tobacco Counseling Counseling given: Not Answered   Clinical Intake:  Pre-visit preparation completed: Yes  Pain : No/denies pain     Nutritional Risks: None Diabetes: No  How often do you need to have someone help you when you read instructions, pamphlets, or other written materials from your doctor or pharmacy?: 1 - Never  Diabetic?no  Interpreter Needed?: No  Information entered by :: Kirke Shaggy, LPN   Activities of Daily Living    12/02/2021    1:50 PM 11/27/2021    4:14 PM  In your present state of health, do you have any difficulty performing the following activities:  Hearing? 0 0  Vision? 1 1  Difficulty concentrating or making decisions? 0 0  Walking or climbing stairs? 1 1  Dressing or bathing? 0 0  Doing errands, shopping? 0 1  Preparing Food and eating ? N Y  Using the Toilet? N N  In the past six months, have you  accidently leaked urine? N Y  Do you have problems  with loss of bowel control? N N  Managing your Medications? N N  Managing your Finances? N Y  Housekeeping or managing your Housekeeping? Tempie Donning    Patient Care Team: [name], FNP as PCP - General (Family Medicine) Sindy Guadeloupe, MD as Consulting Physician (Oncology) Jules Husbands, MD as Consulting Physician (General Surgery) Dasher, Rayvon Char, MD (Dermatology) Scheeler, Carola Rhine, PA-C as Physician Assistant (Plastic Surgery) Harvest Dark, MD as Attending Physician (Emergency Medicine) Vertell Limber (Neurology) Marlowe Sax, MD as Referring Physician (Internal Medicine) Chesley Mires, MD as Consulting Physician (Pulmonary Disease) Germaine Pomfret, [facility] (Pharmacist)  Indicate any recent Medical Services you may have received from other than [facility] providers in the past year (date may be approximate).     Assessment:   This is a routine wellness examination for Amelita.  Hearing/Vision screen Hearing Screening - Comments:: No aids Vision Screening - Comments:: Wears readers- [facility]  Dietary issues and exercise activities discussed: Current Exercise Habits: Home exercise routine, Type of exercise: walking, Time (Minutes): 30, Frequency (Times/Week): 3, Weekly Exercise (Minutes/Week): 90, Intensity: Mild   Goals Addressed             This Visit's Progress    DIET - EAT MORE FRUITS AND VEGETABLES         Depression Screen    12/02/2021    1:47 PM 11/03/2021    3:32 PM 08/17/2021    2:16 PM 01/12/2021    3:56 PM 03/02/2020   11:07 AM 02/15/2019    2:01 PM 12/04/2017    9:44 AM  PHQ 2/9 Scores  PHQ - 2 Score '4 5 1 2 1 2 '$ 0  PHQ- 9 Score '11 14  7  5     '$ Fall Risk    12/02/2021    1:49 PM 11/29/2021   10:44 AM 11/27/2021    4:14 PM 11/03/2021    3:32 PM 01/12/2021    3:57 PM  Fall Risk   Falls in the past year? 1 0 '1 1 1  '$ Number falls in past yr: 0  1 0 1  Injury with Fall? 1  1 0 1  Risk for fall due to : History of fall(s)   History of  fall(s) History of fall(s)  Follow up Falls prevention discussed    Falls evaluation completed    FALL RISK PREVENTION PERTAINING TO THE HOME:  Any stairs in or around the home? No  If so, are there any without handrails? No  Home free of loose throw rugs in walkways, pet beds, electrical cords, etc? Yes  Adequate lighting in your home to reduce risk of falls? Yes   ASSISTIVE DEVICES UTILIZED TO PREVENT FALLS:  Life alert? No  Use of a cane, walker or w/c? No  Grab bars in the bathroom? Yes  Shower chair or bench in shower? No  Elevated toilet seat or a handicapped toilet? No    Cognitive Function:        12/02/2021    1:52 PM  6CIT Screen  What Year? 0 points  What month? 0 points  What time? 0 points  Count back from 20 0 points  Months in reverse 0 points  Repeat phrase 0 points  Total Score 0 points    Immunizations Immunization History  Administered Date(s) Administered   Fluad Quad(high Dose 65+) 01/31/2019, 02/20/2020, 05/26/2021   Influenza,inj,Quad PF,6+ Mos 03/02/2017,  06/08/2018   PFIZER(Purple Top)SARS-COV-2 Vaccination 07/05/2019, 07/26/2019, 03/31/2020   PNEUMOCOCCAL CONJUGATE-20 05/26/2021    TDAP status: Due, Education has been provided regarding the importance of this vaccine. Advised may receive this vaccine at local pharmacy or Health Dept. Aware to provide a copy of the vaccination record if obtained from local pharmacy or Health Dept. Verbalized acceptance and understanding.  Flu Vaccine status: Up to date  Pneumococcal vaccine status: Due, Education has been provided regarding the importance of this vaccine. Advised may receive this vaccine at local pharmacy or Health Dept. Aware to provide a copy of the vaccination record if obtained from local pharmacy or Health Dept. Verbalized acceptance and understanding.  Covid-19 vaccine status: Completed vaccines  Qualifies for Shingles Vaccine? Yes   Zostavax completed No   Shingrix Completed?: No.     Education has been provided regarding the importance of this vaccine. Patient has been advised to call insurance company to determine out of pocket expense if they have not yet received this vaccine. Advised may also receive vaccine at local pharmacy or Health Dept. Verbalized acceptance and understanding.  Screening Tests Health Maintenance  Topic Date Due   TETANUS/TDAP  Never done   Zoster Vaccines- Shingrix (1 of 2) Never done   DEXA SCAN  Never done   COVID-19 Vaccine (4 - Booster for Pfizer series) 05/26/2020   INFLUENZA VACCINE  11/30/2021   COLONOSCOPY (Pts 45-56yr Insurance coverage will need to be confirmed)  10/29/2022   MAMMOGRAM  03/05/2023   Pneumonia Vaccine 66 Years old  Completed   Hepatitis C Screening  Completed   HPV VACCINES  Aged Out    Health Maintenance  Health Maintenance Due  Topic Date Due   TETANUS/TDAP  Never done   Zoster Vaccines- Shingrix (1 of 2) Never done   DEXA SCAN  Never done   COVID-19 Vaccine (4 - Booster for Pfizer series) 05/26/2020   INFLUENZA VACCINE  11/30/2021    Colorectal cancer screening: Type of screening: Colonoscopy. Completed 10/29/19. Repeat every 3 years  Mammogram status: Completed 03/04/21. Repeat every year  [facility] declined referral  Lung Cancer Screening: (Low Dose CT Chest recommended if Age 762-80years, 30 pack-year currently smoking OR have quit w/in 15years.) does not qualify.   Additional Screening:  Hepatitis C Screening: does qualify; Completed 11/03/21  Vision Screening: Recommended annual ophthalmology exams for early detection of glaucoma and other disorders of the eye. Is the patient up to date with their annual eye exam?  Yes  Who is the provider or what is the name of the office in which the patient attends annual eye exams? [facility] GrassIf pt is not established with a provider, would they like to be referred to a provider to establish care? No .   Dental Screening: Recommended annual dental exams for  proper oral hygiene  Community Resource Referral / Chronic Care Management: CRR required this visit?  No   CCM required this visit?  No      Plan:     I have personally reviewed and noted the following in the patient's chart:   Medical and social history Use of alcohol, tobacco or illicit drugs  Current medications and supplements including opioid prescriptions.  Functional ability and status Nutritional status Physical activity Advanced directives List of other physicians Hospitalizations, surgeries, and ER visits in previous 12 months Vitals Screenings to include cognitive, depression, and falls Referrals and appointments  In addition, I have reviewed and discussed with patient certain preventive protocols,  quality metrics, and best practice recommendations. A written personalized care plan for preventive services as well as general preventive health recommendations were provided to patient.     Dionisio David, LPN   01/01/9573   Nurse Notes: none

## 2021-12-02 NOTE — Progress Notes (Signed)
I,Sulibeya S Dimas,acting as a Education administrator for Denise Sprout, FNP.,have documented all relevant documentation on the behalf of Denise Sprout, FNP,as directed by  Denise Sprout, FNP while in the presence of Denise Sprout, FNP.   Established patient visit   Patient: Denise Watts   DOB: Sep 12, 1955   66 y.o. Female  MRN: [id number] Visit Date: 12/02/2021  Today's healthcare provider: [name], FNP  Introduced to nurse practitioner role and practice setting.  All questions answered.  Discussed provider/patient relationship and expectations.   Chief Complaint  Patient presents with   URI   Subjective    HPI  Upper respiratory symptoms She complains of bilateral ear pressure/pain, achiness, congestion, productive cough with  green colored sputum, shortness of breath, sneezing, sore throat, and wheezing.with no fever, chills, night sweats or weight loss. Onset of symptoms was a few weeks ago and gradually worsening.She is drinking plenty of fluids.  Past history is significant for COPD and emphysema. Patient is non-smoker. Patient reports using albuterol inhaler 1-2 times a day.   ---------------------------------------------------------------------------------------------------   Medications: Outpatient Medications Prior to Visit  Medication Sig   albuterol (VENTOLIN HFA) 108 (90 Base) MCG/ACT inhaler Inhale 2 puffs into the lungs every 6 (six) hours as needed for shortness of breath.   alum & mag hydroxide-simeth (MAALOX PLUS) 400-400-40 MG/5ML suspension Take 15 mLs by mouth every 6 (six) hours as needed for indigestion.   atorvastatin (LIPITOR) 40 MG tablet Take 40 mg by mouth daily.   benzonatate (TESSALON) 100 MG capsule Take 2 capsules (200 mg total) by mouth 3 (three) times daily.   brompheniramine-pseudoephedrine-DM 30-2-10 MG/5ML syrup Take 5 mLs by mouth 4 (four) times daily as needed.   Budeson-Glycopyrrol-Formoterol (BREZTRI AEROSPHERE) 160-9-4.8 MCG/ACT AERO Inhale 2 puffs  into the lungs in the morning and at bedtime.   buPROPion (WELLBUTRIN) 75 MG tablet Take 75 mg by mouth 2 (two) times daily.   busPIRone (BUSPAR) 5 MG tablet Take 1 tablet (5 mg total) by mouth 2 (two) times daily.   cyclobenzaprine (FLEXERIL) 5 MG tablet TAKE 1 TABLET BY MOUTH AT BEDTIME   DULoxetine (CYMBALTA) 60 MG capsule Take 1 capsule (60 mg total) by mouth daily.   fluconazole (DIFLUCAN) 150 MG tablet Take one by mouth for vaginal yeast; repeat in 4 days if symptoms remain   fluticasone (FLONASE) 50 MCG/ACT nasal spray Place 1 spray into both nostrils at bedtime.   gabapentin (NEURONTIN) 300 MG capsule Take 1 capsule (300 mg total) by mouth 3 (three) times daily.   montelukast (SINGULAIR) 10 MG tablet Take 1 tablet (10 mg total) by mouth at bedtime.   oxybutynin (DITROPAN XL) 15 MG 24 hr tablet Take 1 tablet (15 mg total) by mouth daily.   rOPINIRole (REQUIP) 0.25 MG tablet Take 1 tablet (0.25 mg total) by mouth at bedtime.   Facility-Administered Medications Prior to Visit  Medication Dose Route Frequency Provider   cyanocobalamin ((VITAMIN B-12)) injection 1,000 mcg  1,000 mcg Intramuscular Q30 days Denise Guadeloupe, MD   cyanocobalamin ((VITAMIN B-12)) injection 1,000 mcg  1,000 mcg Intramuscular Q30 days Denise Guadeloupe, MD    Review of Systems  Constitutional:  Negative for chills and fever.  HENT:  Positive for congestion, ear pain, sneezing and sore throat.   Respiratory:  Positive for cough, chest tightness, shortness of breath and wheezing.   Cardiovascular:  Negative for chest pain.        Objective  BP (!) 101/52 (BP Location: Left Arm, Patient Position: Sitting, Cuff Size: Normal)   Pulse 100   Temp 98.3 F (36.8 C) (Oral)   Resp 16   Wt 141 lb 14.4 oz (64.4 kg)   SpO2 100%   BMI 26.81 kg/m  BP Readings from Last 3 Encounters:  12/02/21 (!) 101/52  11/29/21 124/85  11/17/21 93/63   Wt Readings from Last 3 Encounters:  12/02/21 141 lb 14.4 oz (64.4 kg)   12/02/21 141 lb (64 kg)  11/29/21 141 lb (64 kg)      Physical Exam Vitals and nursing note reviewed.  Constitutional:      General: She is not in acute distress.    Appearance: Normal appearance. She is overweight. She is not ill-appearing, toxic-appearing or diaphoretic.  HENT:     Head: Normocephalic and atraumatic.     Right Ear: Swelling and tenderness present. A middle ear effusion is present.  Cardiovascular:     Rate and Rhythm: Regular rhythm. Tachycardia present.     Pulses: Normal pulses.     Heart sounds: Normal heart sounds. No murmur heard.    No friction rub. No gallop.  Pulmonary:     Effort: Pulmonary effort is normal. No respiratory distress.     Breath sounds: No stridor. Rhonchi present. No wheezing or rales.     Comments: Rhonchi in R base; clears with forced golf Chest:     Chest wall: No tenderness.  Abdominal:     General: Bowel sounds are normal.     Palpations: Abdomen is soft.  Musculoskeletal:        General: No swelling, tenderness, deformity or signs of injury. Normal range of motion.     Right lower leg: No edema.     Left lower leg: No edema.  Skin:    General: Skin is warm and dry.     Capillary Refill: Capillary refill takes less than 2 seconds.     Coloration: Skin is not jaundiced or pale.     Findings: No bruising, erythema, lesion or rash.  Neurological:     General: No focal deficit present.     Mental Status: She is alert and oriented to person, place, and time. Mental status is at baseline.     Cranial Nerves: No cranial nerve deficit.     Sensory: No sensory deficit.     Motor: No weakness.     Coordination: Coordination normal.  Psychiatric:        Mood and Affect: Mood normal.        Behavior: Behavior normal.        Thought Content: Thought content normal.        Judgment: Judgment normal.     No results found for any visits on 12/02/21.  Assessment & Plan     Problem List Items Addressed This Visit        Cardiovascular and Mediastinum   Pericardial effusion    Seen on CT; reviewed with patient Referral to Cards given complaints of SOB/DOE acute/chronic      Relevant Medications   predniSONE (DELTASONE) 10 MG tablet   Other Relevant Orders   Ambulatory referral to Cardiology     Nervous and Auditory   Recurrent acute suppurative otitis media of right ear without spontaneous rupture of tympanic membrane    Failed to respond to Levaquin; recommend use of dual agent medication  Continue to use suppurative medications to assist Continue to drink water daily.  Goal  of 64 oz/day minimum.  Can use OTC allergy medication, ex Zyrtec as well as nasal steroid, ex Flonase.  If you are congested you can use Saline Nasal Spray.  Use saline prior to using Flonase if needed.  You can use medications for cough like Delsym and for mucus, like Mucinex.  Can add lozenges or drink tea with local honey to help relieve symptoms.       Relevant Medications   guaiFENesin-codeine 100-10 MG/5ML syrup   predniSONE (DELTASONE) 10 MG tablet   guaiFENesin-dextromethorphan (ROBITUSSIN DM) 100-10 MG/5ML syrup   azithromycin (ZITHROMAX) 250 MG tablet   doxycycline (VIBRA-TABS) 100 MG tablet     Other   DOE (dyspnea on exertion) - Primary    Acute on chronic, reports worsening cough LCTAB; rhonchi in R base cleared with forced cough Encourage use of increased water with guaifenesin  Upcoming appt with pulm      Relevant Medications   guaiFENesin-codeine 100-10 MG/5ML syrup   predniSONE (DELTASONE) 10 MG tablet   guaiFENesin-dextromethorphan (ROBITUSSIN DM) 100-10 MG/5ML syrup   azithromycin (ZITHROMAX) 250 MG tablet   doxycycline (VIBRA-TABS) 100 MG tablet   Other Relevant Orders   Ambulatory referral to Cardiology     Return in about 3 weeks (around 12/23/2021) for chonic disease management.      Vonna Kotyk, FNP, have reviewed all documentation for this visit. The documentation on 12/02/21  for the exam, diagnosis, procedures, and orders are all accurate and complete.    Denise Watts, [place] [phone] (phone) [phone] (fax)  [place]

## 2021-12-02 NOTE — Assessment & Plan Note (Signed)
Seen on CT; reviewed with patient Referral to Cards given complaints of SOB/DOE acute/chronic

## 2021-12-02 NOTE — Patient Instructions (Signed)
Denise Watts , Thank you for taking time to come for your Medicare Wellness Visit. I appreciate your ongoing commitment to your health goals. Please review the following plan we discussed and let me know if I can assist you in the future.   Screening recommendations/referrals: Colonoscopy: 10/29/19 Mammogram: 03/04/21 Bone Density: declined Recommended yearly ophthalmology/optometry visit for glaucoma screening and checkup Recommended yearly dental visit for hygiene and checkup  Vaccinations: Influenza vaccine: 05/26/21 Pneumococcal vaccine: 05/26/21 Tdap vaccine: n/d Shingles vaccine: n/d   Covid-19:07/05/19, 07/26/19, 03/31/20  Advanced directives: no  Conditions/risks identified: none  Next appointment: Follow up in one year for your annual wellness visit 12/05/22 @ 1 pm by phone   Preventive Care 65 Years and Older, Female Preventive care refers to lifestyle choices and visits with your health care provider that can promote health and wellness. What does preventive care include? A yearly physical exam. This is also called an annual well check. Dental exams once or twice a year. Routine eye exams. Ask your health care provider how often you should have your eyes checked. Personal lifestyle choices, including: Daily care of your teeth and gums. Regular physical activity. Eating a healthy diet. Avoiding tobacco and drug use. Limiting alcohol use. Practicing safe sex. Taking low-dose aspirin every day. Taking vitamin and mineral supplements as recommended by your health care provider. What happens during an annual well check? The services and screenings done by your health care provider during your annual well check will depend on your age, overall health, lifestyle risk factors, and family history of disease. Counseling  Your health care provider may ask you questions about your: Alcohol use. Tobacco use. Drug use. Emotional well-being. Home and relationship well-being. Sexual  activity. Eating habits. History of falls. Memory and ability to understand (cognition). Work and work Statistician. Reproductive health. Screening  You may have the following tests or measurements: Height, weight, and BMI. Blood pressure. Lipid and cholesterol levels. These may be checked every 5 years, or more frequently if you are over 44 years old. Skin check. Lung cancer screening. You may have this screening every year starting at age 11 if you have a 30-pack-year history of smoking and currently smoke or have quit within the past 15 years. Fecal occult blood test (FOBT) of the stool. You may have this test every year starting at age 34. Flexible sigmoidoscopy or colonoscopy. You may have a sigmoidoscopy every 5 years or a colonoscopy every 10 years starting at age 52. Hepatitis C blood test. Hepatitis B blood test. Sexually transmitted disease (STD) testing. Diabetes screening. This is done by checking your blood sugar (glucose) after you have not eaten for a while (fasting). You may have this done every 1-3 years. Bone density scan. This is done to screen for osteoporosis. You may have this done starting at age 67. Mammogram. This may be done every 1-2 years. Talk to your health care provider about how often you should have regular mammograms. Talk with your health care provider about your test results, treatment options, and if necessary, the need for more tests. Vaccines  Your health care provider may recommend certain vaccines, such as: Influenza vaccine. This is recommended every year. Tetanus, diphtheria, and acellular pertussis (Tdap, Td) vaccine. You may need a Td booster every 10 years. Zoster vaccine. You may need this after age 26. Pneumococcal 13-valent conjugate (PCV13) vaccine. One dose is recommended after age 40. Pneumococcal polysaccharide (PPSV23) vaccine. One dose is recommended after age 75. Talk to your health care  provider about which screenings and vaccines  you need and how often you need them. This information is not intended to replace advice given to you by your health care provider. Make sure you discuss any questions you have with your health care provider. Document Released: 05/15/2015 Document Revised: 01/06/2016 Document Reviewed: 02/17/2015 Elsevier Interactive Patient Education  2017 [place] Prevention in the Home Falls can cause injuries. They can happen to people of all ages. There are many things you can do to make your home safe and to help prevent falls. What can I do on the outside of my home? Regularly fix the edges of walkways and driveways and fix any cracks. Remove anything that might make you trip as you walk through a door, such as a raised step or threshold. Trim any bushes or trees on the path to your home. Use bright outdoor lighting. Clear any walking paths of anything that might make someone trip, such as rocks or tools. Regularly check to see if handrails are loose or broken. Make sure that both sides of any steps have handrails. Any raised decks and porches should have guardrails on the edges. Have any leaves, snow, or ice cleared regularly. Use sand or salt on walking paths during winter. Clean up any spills in your garage right away. This includes oil or grease spills. What can I do in the bathroom? Use night lights. Install grab bars by the toilet and in the tub and shower. Do not use towel bars as grab bars. Use non-skid mats or decals in the tub or shower. If you need to sit down in the shower, use a plastic, non-slip stool. Keep the floor dry. Clean up any water that spills on the floor as soon as it happens. Remove soap buildup in the tub or shower regularly. Attach bath mats securely with double-sided non-slip rug tape. Do not have throw rugs and other things on the floor that can make you trip. What can I do in the bedroom? Use night lights. Make sure that you have a light by your bed that  is easy to reach. Do not use any sheets or blankets that are too big for your bed. They should not hang down onto the floor. Have a firm chair that has side arms. You can use this for support while you get dressed. Do not have throw rugs and other things on the floor that can make you trip. What can I do in the kitchen? Clean up any spills right away. Avoid walking on wet floors. Keep items that you use a lot in easy-to-reach places. If you need to reach something above you, use a strong step stool that has a grab bar. Keep electrical cords out of the way. Do not use floor polish or wax that makes floors slippery. If you must use wax, use non-skid floor wax. Do not have throw rugs and other things on the floor that can make you trip. What can I do with my stairs? Do not leave any items on the stairs. Make sure that there are handrails on both sides of the stairs and use them. Fix handrails that are broken or loose. Make sure that handrails are as long as the stairways. Check any carpeting to make sure that it is firmly attached to the stairs. Fix any carpet that is loose or worn. Avoid having throw rugs at the top or bottom of the stairs. If you do have throw rugs, attach them to the  floor with carpet tape. Make sure that you have a light switch at the top of the stairs and the bottom of the stairs. If you do not have them, ask someone to add them for you. What else can I do to help prevent falls? Wear shoes that: Do not have high heels. Have rubber bottoms. Are comfortable and fit you well. Are closed at the toe. Do not wear sandals. If you use a stepladder: Make sure that it is fully opened. Do not climb a closed stepladder. Make sure that both sides of the stepladder are locked into place. Ask someone to hold it for you, if possible. Clearly mark and make sure that you can see: Any grab bars or handrails. First and last steps. Where the edge of each step is. Use tools that help you  move around (mobility aids) if they are needed. These include: Canes. Walkers. Scooters. Crutches. Turn on the lights when you go into a dark area. Replace any light bulbs as soon as they burn out. Set up your furniture so you have a clear path. Avoid moving your furniture around. If any of your floors are uneven, fix them. If there are any pets around you, be aware of where they are. Review your medicines with your doctor. Some medicines can make you feel dizzy. This can increase your chance of falling. Ask your doctor what other things that you can do to help prevent falls. This information is not intended to replace advice given to you by your health care provider. Make sure you discuss any questions you have with your health care provider. Document Released: 02/12/2009 Document Revised: 09/24/2015 Document Reviewed: 05/23/2014 Elsevier Interactive Patient Education  2017 [employer].

## 2021-12-07 NOTE — Progress Notes (Unsigned)
Cardiology Office Note:    Date:  12/08/2021   ID:  Denise Watts, DOB 25-Apr-1956, MRN [id number]  PCP:  [name], FNP  [facility] Cardiologist:  Kate Sable, MD  [place] Electrophysiologist:  None   Referring MD: [name], FNP   Chief Complaint: 6 month follow-up  History of Present Illness:    Denise Watts is a 66 y.o. 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   Previously seen for aortic atherosclerosis in the aorta and calcifications in the pLAD. Lexiscan showed no significant ischemia or scar, normal LVEF, no significant CAD. Echo showed LVEF 60-65%, no WMA, G1DD.  Last seen 11/2020 and was stable SOB, possibly from the lungs.   Today, the patient reports she has not been doing to well. She has been going to multiple doctors for breathing issues. She has been on antibiotics and steroids. She has lower leg edema when she is on steroids.  She has an upcoming appointment with pulmonology.  She has chronic shortness of breath. She dose have occasional chest pain, but she also has GERD. She has some orthostatic symptoms, she is not on BP meds. Recent Chest CT showed trace pericardial fluid.  Past Medical History:  Diagnosis Date   Anxiety    Arthritis    KNEE RIGHT   Breast cancer ([place]) 1997   right breast lumpectomy with radiation   Breast cancer ([place]) 11/15/2017   INVASIVE MAMMARY CARCINOMA/ triple negative   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[facility]) 11/10/2014   Cough 03/02/2017   INTERMTTENT DRY COUGH   Depression    GERD (gastroesophageal reflux disease)    History of hiatal hernia    Personal history of chemotherapy    Personal history of radiation therapy    Skin cancer    Throat cancer ([place]) 1998   radiation    Past Surgical History:  Procedure Laterality Date   BREAST BIOPSY Right 1997   positive   BREAST BIOPSY Right 11/15/2017   INVASIVE MAMMARY CARCINOMA triple  negative   BREAST BIOPSY Right 07/20/2020   [place] bx, Q marker, neg   BREAST LUMPECTOMY Right 1997   2019 also   BREAST LUMPECTOMY WITH SENTINEL LYMPH NODE BIOPSY Right 12/08/2017   Procedure: BREAST LUMPECTOMY WITH SENTINEL LYMPH NODE BX;  Surgeon: Robert Bellow, MD;  Location: [facility];  Service: General;  Laterality: Right;   BREAST REDUCTION WITH MASTOPEXY Left 06/10/2019   Procedure: Left breast mastopexy reduction for symmetry;  Surgeon: Wallace Going, DO;  Location: [facility];  Service: Plastics;  Laterality: Left;  total 2.5 hours   COLONOSCOPY  2015   COLONOSCOPY WITH PROPOFOL N/A 10/29/2019   Procedure: COLONOSCOPY WITH PROPOFOL;  Surgeon: Lucilla Lame, MD;  Location: [facility] ENDOSCOPY;  Service: Endoscopy;  Laterality: N/A;   ESOPHAGOGASTRODUODENOSCOPY (EGD) WITH PROPOFOL N/A 05/24/2019   Procedure: ESOPHAGOGASTRODUODENOSCOPY (EGD) WITH PROPOFOL;  Surgeon: Lucilla Lame, MD;  Location: [facility] END[facility]COPY;  Service: Endoscopy;  Laterality: N/A;   ESOPHAGUS SURGERY     KNEE SURGERY Right    LIPOSUCTION WITH LIPOFILLING Right 06/10/2019   Procedure: right breast scar and capsule release with fat grafting;  Surgeon: Wallace Going, DO;  Location: [facility];  Service: Plastics;  Laterality: Right;   PORT-A-CATH REMOVAL     PORTACATH PLACEMENT Right 01/17/2018   Procedure: INSERTION PORT-A-CATH- RIGHT;  Surgeon: Robert Bellow, MD;  Location: [facility];  Service: General;  Laterality: Right;   ROBOTIC ADRENALECTOMY Right 12/28/2016   Procedure: ROBOTIC ADRENALECTOMY;  Surgeon: Hollice Espy, MD;  Location: [facility];  Service: Urology;  Laterality: Right;   SKIN CANCER EXCISION     THROAT SURGERY  1998   throat cancer    TUBAL LIGATION     VENTRAL HERNIA REPAIR N/A 03/03/2017   Procedure: HERNIA REPAIR VENTRAL ADULT;  Surgeon: Clayburn Pert, MD;  Location: [facility];  Service: General;  Laterality: N/A;    Current Medications: Current  Meds  Medication Sig   albuterol (VENTOLIN HFA) 108 (90 Base) MCG/ACT inhaler Inhale 2 puffs into the lungs every 6 (six) hours as needed for shortness of breath.   alum & mag hydroxide-simeth (MAALOX PLUS) 400-400-40 MG/5ML suspension Take 15 mLs by mouth every 6 (six) hours as needed for indigestion.   aspirin EC 81 MG tablet Take 1 tablet (81 mg total) by mouth daily. Swallow whole.   atorvastatin (LIPITOR) 40 MG tablet Take 40 mg by mouth daily.   benzonatate (TESSALON) 100 MG capsule Take 2 capsules (200 mg total) by mouth 3 (three) times daily.   brompheniramine-pseudoephedrine-DM 30-2-10 MG/5ML syrup Take 5 mLs by mouth 4 (four) times daily as needed.   Budeson-Glycopyrrol-Formoterol (BREZTRI AEROSPHERE) 160-9-4.8 MCG/ACT AERO Inhale 2 puffs into the lungs in the morning and at bedtime.   buPROPion (WELLBUTRIN) 75 MG tablet Take 75 mg by mouth 2 (two) times daily.   busPIRone (BUSPAR) 5 MG tablet Take 1 tablet (5 mg total) by mouth 2 (two) times daily.   cyclobenzaprine (FLEXERIL) 5 MG tablet TAKE 1 TABLET BY MOUTH AT BEDTIME   doxycycline (VIBRA-TABS) 100 MG tablet Take 1 tablet (100 mg total) by mouth 2 (two) times daily.   DULoxetine (CYMBALTA) 60 MG capsule Take 1 capsule (60 mg total) by mouth daily.   fluconazole (DIFLUCAN) 150 MG tablet Take one by mouth for vaginal yeast; repeat in 4 days if symptoms remain   fluticasone (FLONASE) 50 MCG/ACT nasal spray Place 1 spray into both nostrils at bedtime.   gabapentin (NEURONTIN) 300 MG capsule Take 1 capsule (300 mg total) by mouth 3 (three) times daily.   guaiFENesin-codeine 100-10 MG/5ML syrup Take 10 mLs by mouth at bedtime and may repeat dose one time if needed.   guaiFENesin-dextromethorphan (ROBITUSSIN DM) 100-10 MG/5ML syrup Take 5 mLs by mouth every 4 (four) hours as needed for cough.   ivabradine (CORLANOR) 5 MG TABS tablet Take 2 tablets (10 mg total) by mouth once for 1 dose. TAKE 2 HOURS PRIOR TO CT   montelukast (SINGULAIR)  10 MG tablet Take 1 tablet (10 mg total) by mouth at bedtime.   oxybutynin (DITROPAN XL) 15 MG 24 hr tablet Take 1 tablet (15 mg total) by mouth daily.   predniSONE (DELTASONE) 10 MG tablet Day 1 & 2 take 6 tablets Day 3 &4 take 5 tablets Day 5 &6 take 4 tablets Day 7 & 8 take 3 tablets Day 9 & 10 take 2 tablets Day 11 & 12 take 1 tablet Day 13 & 14 take 1/2 tablet   rOPINIRole (REQUIP) 0.25 MG tablet Take 1 tablet (0.25 mg total) by mouth at bedtime.     Allergies:   Sulfa antibiotics   Social History   Socioeconomic History   Marital status: Married    Spouse name: Not on file   Number of children: 4   Years of education: Not on file   Highest education  level: 10th grade  Occupational History   Occupation: retired  Tobacco Use   Smoking status: Former    Packs/day: 1.50    Years: 30.00    Total pack years: 45.00    Types: Cigarettes    Quit date: 05/03/1995    Years since quitting: 26.6   Smokeless tobacco: Never  Vaping Use   Vaping Use: Never used  Substance and Sexual Activity   Alcohol use: Yes    Alcohol/week: 4.0 standard drinks of alcohol    Types: 4 Shots of liquor per week    Comment: none   Drug use: No   Sexual activity: Not Currently    Birth control/protection: Post-menopausal  Other Topics Concern   Not on file  Social History Narrative   Not on file   Social Determinants of Health   Financial Resource Strain: Low Risk  (12/02/2021)   Overall Financial Resource Strain (CARDIA)    Difficulty of Paying Living Expenses: Not very hard  Food Insecurity: No Food Insecurity (12/02/2021)   Hunger Vital Sign    Worried About Running Out of Food in the Last Year: Never true    Ran Out of Food in the Last Year: Never true  Transportation Needs: No Transportation Needs (12/02/2021)   PRAPARE - Hydrologist (Medical): No    Lack of Transportation (Non-Medical): No  Physical Activity: Insufficiently Active (12/02/2021)   Exercise Vital Sign     Days of Exercise per Week: 3 days    Minutes of Exercise per Session: 30 min  Stress: Stress Concern Present (12/02/2021)   [place]    Feeling of Stress : To some extent  Social Connections: Moderately Isolated (12/02/2021)   Social Connection and Isolation Panel [NHANES]    Frequency of Communication with Friends and Family: Three times a week    Frequency of Social Gatherings with Friends and Family: Twice a week    Attends Religious Services: Never    Marine scientist or Organizations: No    Attends Music therapist: Never    Marital Status: Married     Family History: The patient's family history includes Cancer in her paternal aunt, paternal grandmother, and sister; Cancer (age of onset: 49) in her sister; Diabetes in her mother; Heart attack in her mother. There is no history of Prostate cancer, Kidney cancer, or Bladder Cancer.  ROS:   Please see the history of present illness.     All other systems reviewed and are negative.  EKGs/Labs/Other Studies Reviewed:    The following studies were reviewed today:  Echo 11/2020  1. Left ventricular ejection fraction, by estimation, is 60 to 65%. The  left ventricle has normal function. The left ventricle has no regional  wall motion abnormalities. Left ventricular diastolic parameters are  consistent with Grade I diastolic  dysfunction (impaired relaxation).   2. Right ventricular systolic function is normal. The right ventricular  size is normal.   3. The mitral valve is normal in structure. No evidence of mitral valve  regurgitation.   4. The aortic valve is tricuspid. Aortic valve regurgitation is not  visualized.   Myoview 09/2020 Narrative & Impression  Normal pharmacologic myocardial perfusion stress test without significant ischemia or scar. The left ventricular ejection fraction is normal (62%). Aortic atherosclerosis noted on the  attenuation correction CT. There is no significant coronary artery calcification. Left lower lobe lung nodule measuring ~1 cm  is stable compared with dedicated chest CT from 08/21/2020. This is a low risk study. Sensitivity and specificity of the study are degraded by significant extracardiac activity.      EKG:  EKG is ordered today.  The ekg ordered today demonstrates NSR 98bpm, nonspecific ST/T wave changes  Recent Labs: 08/27/2021: NT-Pro BNP 60 11/03/2021: ALT 14; Hemoglobin 14.0; Platelets 216; TSH 0.536 12/08/2021: BUN 22; Creatinine, Ser 0.90; Potassium 4.8; Sodium 139  Recent Lipid Panel    Component Value Date/Time   CHOL 135 05/26/2021 1101   TRIG 115 05/26/2021 1101   HDL 46 05/26/2021 1101   CHOLHDL 2.9 05/26/2021 1101   LDLCALC 68 05/26/2021 1101     Physical Exam:    VS:  BP 100/60 (BP Location: Left Arm, Patient Position: Sitting, Cuff Size: Normal)   Pulse 98   Ht '5\' 1"'$  (1.549 m)   Wt 141 lb (64 kg)   SpO2 96%   BMI 26.64 kg/m     Wt Readings from Last 3 Encounters:  12/08/21 141 lb (64 kg)  12/02/21 141 lb 14.4 oz (64.4 kg)  12/02/21 141 lb (64 kg)     GEN:  Well nourished, well developed in no acute distress HEENT: Normal NECK: No JVD; No carotid bruits LYMPHATICS: No lymphadenopathy CARDIAC: RRR, no murmurs, rubs, gallops RESPIRATORY:  + rhonchi  ABDOMEN: Soft, non-tender, non-distended MUSCULOSKELETAL:  No edema; No deformity  SKIN: Warm and dry NEUROLOGIC:  Alert and oriented x 3 PSYCHIATRIC:  Normal affect   ASSESSMENT:    1. Shortness of breath   2. Precordial pain   3. Pericardial effusion   4. Chronic obstructive pulmonary disease, unspecified COPD type ([place])    PLAN:    In order of problems listed above:  SOB/chest pain Coronary artery calcifications The patient reports chest pain that is fairly atypical and worsening DOE. Chest pain may be from GERD. She has been following closely with PCP for worsening breathing issues, she has  upcoming appointment with Pulmonology. She has known coronary artery calcifications by chest CT. Echo 11/2020 showed LVEF 60-65%, no WMA, G1DD. MPI 09/2020 showed normal stress test without ischemia or scar, overall low risk. Given worsening of symptoms, I will order a Cardiac CTA. I will start Aspirin '81mg'$  daily. IF breathing issues persist, may need RHC.   COPD This being followed by PCP and pulmonology. This is likely contributing to her breathing issues.   Pericardial effusion Trace pericardial effusion seen on recent Chest CT. I will check an echocardiogram  Disposition: Follow up in 4-6 month(s) with MD/APP    Signed, Kariana Wiles Ninfa Meeker, PA-C  12/08/2021 1:21 PM    [facility]

## 2021-12-08 ENCOUNTER — Encounter: Payer: Self-pay | Admitting: Medical

## 2021-12-08 ENCOUNTER — Other Ambulatory Visit
Admission: RE | Admit: 2021-12-08 | Discharge: 2021-12-08 | Disposition: A | Discharge status: Home / Self Care | Payer: Medicare HMO | Source: Ambulatory Visit | Attending: Medical | Admitting: Medical

## 2021-12-08 ENCOUNTER — Telehealth (HOSPITAL_COMMUNITY): Payer: Self-pay | Admitting: Emergency Medicine

## 2021-12-08 ENCOUNTER — Telehealth: Payer: Self-pay | Admitting: Cardiology

## 2021-12-08 ENCOUNTER — Ambulatory Visit: Payer: Medicare HMO | Admitting: Medical

## 2021-12-08 VITALS — BP 100/60 | HR 98 | Ht 61.0 in | Wt 141.0 lb

## 2021-12-08 DIAGNOSIS — R0602 Shortness of breath: Secondary | ICD-10-CM

## 2021-12-08 DIAGNOSIS — I3139 Other pericardial effusion (noninflammatory): Secondary | ICD-10-CM

## 2021-12-08 DIAGNOSIS — J449 Chronic obstructive pulmonary disease, unspecified: Secondary | ICD-10-CM | POA: Diagnosis not present

## 2021-12-08 DIAGNOSIS — R072 Precordial pain: Secondary | ICD-10-CM | POA: Insufficient documentation

## 2021-12-08 LAB — BASIC METABOLIC PANEL
Anion gap: 9 (ref 5–15)
BUN: 22 mg/dL (ref 8–23)
CO2: 24 mmol/L (ref 22–32)
Calcium: 8.8 mg/dL — ABNORMAL LOW (ref 8.9–10.3)
Chloride: 106 mmol/L (ref 98–111)
Creatinine, Ser: 0.9 mg/dL (ref 0.44–1.00)
GFR, Estimated: >60 mL/min (ref >60)
Glucose, Bld: 130 mg/dL — ABNORMAL HIGH (ref 70–99)
Potassium: 4.8 mmol/L (ref 3.5–5.1)
Sodium: 139 mmol/L (ref 135–145)

## 2021-12-08 MED ORDER — ASPIRIN 81 MG PO TBEC: 81.0000 mg | DELAYED_RELEASE_TABLET | Freq: Every day | ORAL | 3 refills | Status: DC

## 2021-12-08 MED ORDER — IVABRADINE HCL 5 MG PO TABS: 10.0000 mg | ORAL_TABLET | Freq: Once | ORAL | 0 refills | Status: AC

## 2021-12-08 MED ORDER — IVABRADINE HCL 5 MG PO TABS: 10.0000 mg | ORAL_TABLET | Freq: Once | ORAL | 0 refills | Status: DC

## 2021-12-08 NOTE — Telephone Encounter (Signed)
Spoke with [facility] [facility] and they are not able to get the 2 tablets of Corlanor. Will reach out to patient to see if she would like [place] to send to another pharmacy.

## 2021-12-08 NOTE — Telephone Encounter (Signed)
Attempted to call patient regarding upcoming cardiac CT appointment. °Left message on voicemail with name and callback number °Haydn Hutsell RN Navigator Cardiac Imaging °[facility] °[phone] Office °[phone] Cell ° °

## 2021-12-08 NOTE — Telephone Encounter (Signed)
Pt c/o medication issue:  1. Name of Medication:   ivabradine (CORLANOR) 5 MG TABS tablet  2. How are you currently taking this medication (dosage and times per day)?  N/A  3. Are you having a reaction (difficulty breathing--STAT)?   N/A  4. What is your medication issue?   Caller stated that they do not have this medication in stock and wanted to know if an alternate medication could be prescribed.

## 2021-12-08 NOTE — Telephone Encounter (Signed)
Spoke with patient and using goodrx those 2 tablets are $26.15 at [facility]. She verbalized understanding with no further questions at this time.

## 2021-12-08 NOTE — Patient Instructions (Signed)
Medication Instructions:   Your physician has recommended you make the following change in your medication:   1) Start Aspirin 81 mg daily  *If you need a refill on your cardiac medications before your next appointment, please call your pharmacy*   Lab Work: BMET TODAY -  Please go to the [place] at [place] in at the Registration Desk: 1st desk to the right, past the screening table  If you have labs (blood work) drawn today and your tests are completely normal, you will receive your results only by: [place] (if you have MyChart) OR A paper copy in the mail If you have any lab test that is abnormal or we need to change your treatment, we will call you to review the results.   Testing/Procedures:  Your physician has requested that you have an echocardiogram. Echocardiography is a painless test that uses sound waves to create images of your heart. It provides your doctor with information about the size and shape of your heart and how well your heart's chambers and valves are working. This procedure takes approximately one hour. There are no restrictions for this procedure.    Your physician has requested that you have cardiac CT. Cardiac computed tomography (CT) is a painless test that uses an x-ray machine to take clear, detailed pictures of your heart.   Your cardiac CT has been scheduled 12/09/21 at 10:00 AM at the below location:  [facility] [place], [place] [address] [phone]   Please arrive 15 mins early for check-in and test prep.  Please follow these instructions carefully (unless otherwise directed):  Labs: You will need to have blood drawn (BMP) at [facility] prior to procedure. You do not need to be fasting.  -  Please go to the [place] Entrance at [place] in at the Registration Desk: 1st desk to the right, past the screening table   On the Night Before the  Test:  Be sure to Drink plenty of water. Do not consume any caffeinated/decaffeinated beverages or chocolate 12 hours prior to your test.  On the Day of the Test:  Drink plenty of water until 1 hour prior to the test. Do not eat any food 4 hours prior to the test. You may take your regular medications prior to the test.  Take Corlanor '10mg'$  two hours prior to test.       After the Test:  Drink plenty of water. After receiving IV contrast, you may experience a mild flushed feeling. This is normal. On occasion, you may experience a mild rash up to 24 hours after the test. This is not dangerous. If this occurs, you can take Benadryl 25 mg and increase your fluid intake. If you experience trouble breathing, this can be serious. If it is severe call 911 IMMEDIATELY. If it is mild, please call our office. If you take any of these medications: Glipizide/Metformin, Avandament, Glucavance, please do not take 48 hours after completing test unless otherwise instructed.   For non-scheduling related questions, please contact the cardiac imaging nurse navigator should you have any questions/concerns: Marchia Bond, Cardiac Imaging Nurse Navigator Gordy Clement, Cardiac Imaging Nurse Navigator [facility] Direct Office Dial: [phone]   For scheduling needs, including cancellations and rescheduling, please call [place], [phone].   Follow-Up: At [facility], you and your health needs are our priority.  As part of our continuing mission to provide  you with exceptional heart care, we have created designated Provider Care Teams.  These Care Teams include your primary Cardiologist (physician) and Advanced Practice Providers (APPs -  Physician Assistants and Nurse Practitioners) who all work together to provide you with the care you need, when you need it.  We recommend signing up for the patient portal called "MyChart".  Sign up information is provided on this  After Visit Summary.  MyChart is used to connect with patients for Virtual Visits (Telemedicine).  Patients are able to view lab/test results, encounter notes, upcoming appointments, etc.  Non-urgent messages can be sent to your provider as well.   To learn more about what you can do with MyChart, go to [email or web address].    Your next appointment:   6-8 week(s)  The format for your next appointment:   In Person  Provider:   You may see Kate Sable, MD  or one of the following Advanced Practice Providers on your designated Care Team:   Murray Hodgkins, NP Christell Faith, PA-C Cadence Kathlen Mody, [place]    Other Instructions N.A  Important Information About Sugar

## 2021-12-09 ENCOUNTER — Ambulatory Visit
Admission: RE | Admit: 2021-12-09 | Discharge: 2021-12-09 | Disposition: A | Discharge status: Home / Self Care | Payer: Medicare HMO | Source: Ambulatory Visit | Attending: Surgery | Admitting: Surgery

## 2021-12-09 ENCOUNTER — Ambulatory Visit
Admission: RE | Admit: 2021-12-09 | Discharge: 2021-12-09 | Disposition: A | Discharge status: Home / Self Care | Payer: Medicare HMO | Source: Ambulatory Visit | Attending: Medical | Admitting: Medical

## 2021-12-09 ENCOUNTER — Other Ambulatory Visit: Payer: Self-pay | Admitting: Family Medicine

## 2021-12-09 ENCOUNTER — Ambulatory Visit: Admission: RE | Admit: 2021-12-09 | Payer: Medicare HMO | Source: Ambulatory Visit

## 2021-12-09 DIAGNOSIS — R072 Precordial pain: Secondary | ICD-10-CM | POA: Insufficient documentation

## 2021-12-09 DIAGNOSIS — R109 Unspecified abdominal pain: Secondary | ICD-10-CM | POA: Diagnosis not present

## 2021-12-09 DIAGNOSIS — R0609 Other forms of dyspnea: Secondary | ICD-10-CM

## 2021-12-09 DIAGNOSIS — K5730 Diverticulosis of large intestine without perforation or abscess without bleeding: Secondary | ICD-10-CM | POA: Diagnosis not present

## 2021-12-09 DIAGNOSIS — H66004 Acute suppurative otitis media without spontaneous rupture of ear drum, recurrent, right ear: Secondary | ICD-10-CM

## 2021-12-09 MED ORDER — NITROGLYCERIN 0.4 MG SL SUBL: 0.8000 mg | SUBLINGUAL_TABLET | Freq: Once | SUBLINGUAL | Status: DC

## 2021-12-09 MED ORDER — METOPROLOL TARTRATE 5 MG/5ML IV SOLN
10.0000 mg | Freq: Once | INTRAVENOUS | Status: AC
  Administered 2021-12-09: 10 mg via INTRAVENOUS

## 2021-12-09 MED ORDER — NITROGLYCERIN 0.4 MG SL SUBL
0.4000 mg | SUBLINGUAL_TABLET | Freq: Once | SUBLINGUAL | Status: AC
  Administered 2021-12-09: 0.4 mg via SUBLINGUAL

## 2021-12-09 MED ORDER — IOHEXOL 350 MG/ML SOLN
100.0000 mL | Freq: Once | INTRAVENOUS | Status: AC | PRN
  Administered 2021-12-09: 100 mL via INTRAVENOUS

## 2021-12-09 NOTE — Progress Notes (Signed)
Patient tolerated CT well however blood pressure decreased. Patient ambulatory steady stated had some dizziness. Drank water and coffee ate crackers with peanut butter  Blood pressure increased and denies dizziness. Instructed patient to drink water throughout day.Reasons explained and verbalized understanding. Ambulated steady gait.

## 2021-12-09 NOTE — Telephone Encounter (Signed)
Duplicate request. Requested Prescriptions  Pending Prescriptions Disp Refills  . doxycycline (VIBRA-TABS) 100 MG tablet [Pharmacy Med Name: DOXYCYCLINE HYCLATE 100 MG TAB] 10 tablet 0    Sig: TAKE 1 TABLET BY MOUTH TWICE A DAY     Off-Protocol Failed - 12/09/2021  3:25 PM      Failed - Medication not assigned to a protocol, review manually.      Passed - Valid encounter within last 12 months    Recent Outpatient Visits          1 week ago DOE (dyspnea on exertion)   [facility]: [place] Rhonda W Tally Joe T, FNP   3 weeks ago Ventral hernia without obstruction or gangrene   [facility] [name], FNP   1 month ago Generalized abdominal pain   [facility] Jerrol Banana., MD   1 month ago COPD, frequent exacerbations [facility])   [facility] [name], FNP   2 months ago [place] Sproul, [place], PA-C      Future Appointments            In 1 month Fur[place], Cadence H, PA-C [place]

## 2021-12-13 NOTE — Progress Notes (Unsigned)
Established patient visit   Patient: Denise Watts   DOB: 24-Dec-1955   66 y.o. Female  MRN: [id number] Visit Date: 12/15/2021  Today's healthcare provider: [name], FNP  Re Introduced to nurse practitioner role and practice setting.  All questions answered.  Discussed provider/patient relationship and expectations.  I,Chyane Greer J Tilman Mcclaren,acting as a scribe for [name], FNP.,have documented all relevant documentation on the behalf of [name], FNP,as directed by  [name], FNP while in the presence of [name], FNP.  Chief Complaint  Patient presents with   Abdominal Pain    Patient complains of continued abdominal pain, says it is worse since last visit.    COPD    Complains of continued SOB when she walks or doing things around the house.    Subjective    HPI HPI     Abdominal Pain    Additional comments: Patient complains of continued abdominal pain, says it is worse since last visit.         COPD    Additional comments: Complains of continued SOB when she walks or doing things around the house.       Last edited by Smitty Knudsen, CMA on 12/15/2021 10:51 AM.      Medications: Outpatient Medications Prior to Visit  Medication Sig   albuterol (VENTOLIN HFA) 108 (90 Base) MCG/ACT inhaler Inhale 2 puffs into the lungs every 6 (six) hours as needed for shortness of breath.   alum & mag hydroxide-simeth (MAALOX PLUS) 400-400-40 MG/5ML suspension Take 15 mLs by mouth every 6 (six) hours as needed for indigestion.   aspirin EC 81 MG tablet Take 1 tablet (81 mg total) by mouth daily. Swallow whole.   brompheniramine-pseudoephedrine-DM 30-2-10 MG/5ML syrup Take 5 mLs by mouth 4 (four) times daily as needed.   buPROPion (WELLBUTRIN) 75 MG tablet Take 75 mg by mouth 2 (two) times daily.   busPIRone (BUSPAR) 5 MG tablet Take 1 tablet (5 mg total) by mouth 2 (two) times daily.   cyclobenzaprine (FLEXERIL) 5 MG tablet TAKE 1 TABLET BY MOUTH AT BEDTIME    doxycycline (VIBRA-TABS) 100 MG tablet Take 1 tablet (100 mg total) by mouth 2 (two) times daily.   DULoxetine (CYMBALTA) 60 MG capsule Take 1 capsule (60 mg total) by mouth daily.   fluticasone (FLONASE) 50 MCG/ACT nasal spray Place 1 spray into both nostrils at bedtime.   gabapentin (NEURONTIN) 300 MG capsule Take 1 capsule (300 mg total) by mouth 3 (three) times daily.   guaiFENesin-codeine 100-10 MG/5ML syrup Take 10 mLs by mouth at bedtime and may repeat dose one time if needed.   guaiFENesin-dextromethorphan (ROBITUSSIN DM) 100-10 MG/5ML syrup Take 5 mLs by mouth every 4 (four) hours as needed for cough.   montelukast (SINGULAIR) 10 MG tablet Take 1 tablet (10 mg total) by mouth at bedtime.   oxybutynin (DITROPAN XL) 15 MG 24 hr tablet Take 1 tablet (15 mg total) by mouth daily.   predniSONE (DELTASONE) 10 MG tablet Day 1 & 2 take 6 tablets Day 3 &4 take 5 tablets Day 5 &6 take 4 tablets Day 7 & 8 take 3 tablets Day 9 & 10 take 2 tablets Day 11 & 12 take 1 tablet Day 13 & 14 take 1/2 tablet   rOPINIRole (REQUIP) 0.25 MG tablet Take 1 tablet (0.25 mg total) by mouth at bedtime.   [DISCONTINUED] atorvastatin (LIPITOR) 40 MG tablet Take 40 mg by mouth daily.   [  DISCONTINUED] benzonatate (TESSALON) 100 MG capsule Take 2 capsules (200 mg total) by mouth 3 (three) times daily.   [DISCONTINUED] Budeson-Glycopyrrol-Formoterol (BREZTRI AEROSPHERE) 160-9-4.8 MCG/ACT AERO Inhale 2 puffs into the lungs in the morning and at bedtime.   [DISCONTINUED] fluconazole (DIFLUCAN) 150 MG tablet Take one by mouth for vaginal yeast; repeat in 4 days if symptoms remain   Facility-Administered Medications Prior to Visit  Medication Dose Route Frequency Provider   cyanocobalamin ((VITAMIN B-12)) injection 1,000 mcg  1,000 mcg Intramuscular Q30 days Sindy Guadeloupe, MD   cyanocobalamin ((VITAMIN B-12)) injection 1,000 mcg  1,000 mcg Intramuscular Q30 days Sindy Guadeloupe, MD    Review of Systems     Objective     BP 114/77 (BP Location: Left Arm, Patient Position: Sitting, Cuff Size: Normal)   Pulse 76   Temp 98.3 F (36.8 C) (Oral)   Resp 16   Ht '5\' 1"'$  (1.549 m)   Wt 140 lb (63.5 kg)   SpO2 98%   BMI 26.45 kg/m    Physical Exam Vitals and nursing note reviewed.  Constitutional:      General: She is not in acute distress.    Appearance: Normal appearance. She is well-developed and overweight. She is not ill-appearing, toxic-appearing or diaphoretic.  HENT:     Head: Normocephalic and atraumatic.  Cardiovascular:     Rate and Rhythm: Normal rate and regular rhythm.     Pulses: Normal pulses.     Heart sounds: Normal heart sounds. No murmur heard.    No friction rub. No gallop.  Pulmonary:     Effort: Pulmonary effort is normal. No respiratory distress.     Breath sounds: No stridor. Wheezing and rhonchi present. No rales.     Comments: Pt reports occasional wheezing with exertion; lower rhonchi cleared with forced cough.  Chest:     Chest wall: No tenderness.  Abdominal:     General: There is distension.     Comments: Patient reports abdominal fullness/distension; however, denies constipation or GI symptoms outside of GERD  Genitourinary:    Comments: Patient continues to report vaginal discharge Musculoskeletal:        General: No swelling, tenderness, deformity or signs of injury. Normal range of motion.     Right lower leg: No edema.     Left lower leg: No edema.  Skin:    General: Skin is warm and dry.     Capillary Refill: Capillary refill takes less than 2 seconds.     Coloration: Skin is not jaundiced or pale.     Findings: No bruising, erythema, lesion or rash.  Neurological:     General: No focal deficit present.     Mental Status: She is alert and oriented to person, place, and time. Mental status is at baseline.     Cranial Nerves: No cranial nerve deficit.     Sensory: No sensory deficit.     Motor: No weakness.     Coordination: Coordination normal.   Psychiatric:        Mood and Affect: Mood normal.        Behavior: Behavior normal.        Thought Content: Thought content normal.        Judgment: Judgment normal.    No results found for any visits on 12/15/21.  Assessment & Plan     Problem List Items Addressed This Visit       Cardiovascular and Mediastinum   Coronary artery disease of  native artery of native heart with stable angina pectoris (HCC)    Chronic, stable Discussed atypical CP with results of CT Patient agreeable to titration of Statin to Crestor '40mg'$  to assist      Relevant Medications   rosuvastatin (CRESTOR) 40 MG tablet     Respiratory   Mucopurulent chronic bronchitis (HCC) - Primary    Chronic, stable O2 remains normal on RA; continue to recommend use of O2 for activity and SOB; have yet to see pt with use of supplemental O2 Encouraged pt to bring O2 with her to pulm Continue to recommend cough with goal of expectorating sputum       Relevant Medications   benzonatate (TESSALON) 100 MG capsule   fluconazole (DIFLUCAN) 150 MG tablet   Budeson-Glycopyrrol-Formoterol (BREZTRI AEROSPHERE) 160-9-4.8 MCG/ACT AERO     Digestive   Acid reflux    Chronic, worsening per pt report Trial of carafate TID +qHS to assist Has upcoming esophageal study scheduled       Relevant Medications   sucralfate (CARAFATE) 1 g tablet     Other   Vaginal discharge    Chronic, recurrent Continue use of antifungal Discussed use of breathable clothing, open air at sleep, cleaning products and avoiding douching Recommend follow up with GYN for second opinion       Relevant Medications   fluconazole (DIFLUCAN) 150 MG tablet   Other Relevant Orders   Ambulatory referral to Gynecology   Return in about 4 weeks (around 01/12/2022) for chonic disease management.     Vonna Kotyk, FNP, have reviewed all documentation for this visit. The documentation on 12/15/21 for the exam, diagnosis, procedures, and orders are  all accurate and complete.  [name], [place] [phone] (phone) [phone] (fax)  [address]

## 2021-12-14 ENCOUNTER — Ambulatory Visit (INDEPENDENT_AMBULATORY_CARE_PROVIDER_SITE_OTHER): Payer: Medicare HMO

## 2021-12-14 DIAGNOSIS — I3139 Other pericardial effusion (noninflammatory): Secondary | ICD-10-CM | POA: Diagnosis not present

## 2021-12-14 LAB — ECHOCARDIOGRAM COMPLETE
AR max vel: 2.2 cm2
AV Area VTI: 2.12 cm2
AV Area mean vel: 2.14 cm2
AV Mean grad: 2 mmHg
AV Peak grad: 4.4 mmHg
Ao pk vel: 1.05 m/s
Area-P 1/2: 4.21 cm2
Calc EF: 66.2 %
S' Lateral: 1.9 cm
Single Plane A2C EF: 70.5 %
Single Plane A4C EF: 63.8 %

## 2021-12-14 MED ORDER — PERFLUTREN LIPID MICROSPHERE
1.0000 mL | INTRAVENOUS | Status: AC | PRN
  Administered 2021-12-14: 2 mL via INTRAVENOUS

## 2021-12-15 ENCOUNTER — Encounter: Payer: Self-pay | Admitting: Family Medicine

## 2021-12-15 ENCOUNTER — Ambulatory Visit (INDEPENDENT_AMBULATORY_CARE_PROVIDER_SITE_OTHER): Payer: Medicare HMO | Admitting: Family Medicine

## 2021-12-15 VITALS — BP 114/77 | HR 76 | Temp 98.3°F | Resp 16 | Ht 61.0 in | Wt 140.0 lb

## 2021-12-15 DIAGNOSIS — J411 Mucopurulent chronic bronchitis: Secondary | ICD-10-CM | POA: Diagnosis not present

## 2021-12-15 DIAGNOSIS — N898 Other specified noninflammatory disorders of vagina: Secondary | ICD-10-CM

## 2021-12-15 DIAGNOSIS — K2100 Gastro-esophageal reflux disease with esophagitis, without bleeding: Secondary | ICD-10-CM

## 2021-12-15 DIAGNOSIS — I25118 Atherosclerotic heart disease of native coronary artery with other forms of angina pectoris: Secondary | ICD-10-CM | POA: Diagnosis not present

## 2021-12-15 MED ORDER — BREZTRI AEROSPHERE 160-9-4.8 MCG/ACT IN AERO: 2.0000 | INHALATION_SPRAY | Freq: Two times a day (BID) | RESPIRATORY_TRACT | 5 refills | Status: DC

## 2021-12-15 MED ORDER — BENZONATATE 100 MG PO CAPS: 200.0000 mg | ORAL_CAPSULE | Freq: Three times a day (TID) | ORAL | 0 refills | Status: DC

## 2021-12-15 MED ORDER — FLUCONAZOLE 150 MG PO TABS: ORAL_TABLET | ORAL | 0 refills | Status: DC

## 2021-12-15 MED ORDER — SUCRALFATE 1 G PO TABS: 1.0000 g | ORAL_TABLET | Freq: Three times a day (TID) | ORAL | 0 refills | Status: DC

## 2021-12-15 MED ORDER — ROSUVASTATIN CALCIUM 40 MG PO TABS: 40.0000 mg | ORAL_TABLET | Freq: Every day | ORAL | 3 refills | Status: DC

## 2021-12-15 NOTE — Assessment & Plan Note (Signed)
Chronic, worsening per pt report Trial of carafate TID +qHS to assist Has upcoming esophageal study scheduled

## 2021-12-15 NOTE — Assessment & Plan Note (Signed)
Chronic, stable O2 remains normal on RA; continue to recommend use of O2 for activity and SOB; have yet to see pt with use of supplemental O2 Encouraged pt to bring O2 with her to pulm Continue to recommend cough with goal of expectorating sputum

## 2021-12-15 NOTE — Assessment & Plan Note (Signed)
Chronic, stable Discussed atypical CP with results of CT Patient agreeable to titration of Statin to Crestor '40mg'$  to assist

## 2021-12-15 NOTE — Patient Instructions (Signed)
The CDC recommends two doses of Shingrix (the new shingles vaccine) separated by 2 to 6 months for adults age 68 years and older. I recommend checking with your insurance plan regarding coverage for this vaccine.    

## 2021-12-15 NOTE — Assessment & Plan Note (Signed)
Chronic, recurrent Continue use of antifungal Discussed use of breathable clothing, open air at sleep, cleaning products and avoiding douching Recommend follow up with GYN for second opinion

## 2021-12-16 ENCOUNTER — Ambulatory Visit
Admission: RE | Admit: 2021-12-16 | Discharge: 2021-12-16 | Disposition: A | Discharge status: Home / Self Care | Payer: Medicare HMO | Source: Ambulatory Visit | Attending: Surgery | Admitting: Surgery

## 2021-12-16 ENCOUNTER — Telehealth: Payer: Self-pay

## 2021-12-16 DIAGNOSIS — R1310 Dysphagia, unspecified: Secondary | ICD-10-CM | POA: Insufficient documentation

## 2021-12-16 DIAGNOSIS — K219 Gastro-esophageal reflux disease without esophagitis: Secondary | ICD-10-CM | POA: Diagnosis not present

## 2021-12-16 DIAGNOSIS — K222 Esophageal obstruction: Secondary | ICD-10-CM | POA: Diagnosis not present

## 2021-12-16 NOTE — Telephone Encounter (Signed)
Patient notified of Barium swallow results. Reminded to have EGD done prior to her follow up visit.

## 2021-12-20 ENCOUNTER — Other Ambulatory Visit: Payer: Self-pay | Admitting: Family Medicine

## 2021-12-21 DIAGNOSIS — N898 Other specified noninflammatory disorders of vagina: Secondary | ICD-10-CM | POA: Diagnosis not present

## 2021-12-23 ENCOUNTER — Telehealth: Payer: Self-pay

## 2021-12-23 DIAGNOSIS — J9601 Acute respiratory failure with hypoxia: Secondary | ICD-10-CM | POA: Diagnosis not present

## 2021-12-23 DIAGNOSIS — U071 COVID-19: Secondary | ICD-10-CM | POA: Diagnosis not present

## 2021-12-23 NOTE — Telephone Encounter (Signed)
Pulmonary clearance received from Dr.Faud Aleskerov-continue spirometry post operatively- low risk-2 liters of oxygen post op.

## 2022-01-04 ENCOUNTER — Other Ambulatory Visit: Payer: Self-pay | Admitting: Urology

## 2022-01-05 ENCOUNTER — Ambulatory Visit (INDEPENDENT_AMBULATORY_CARE_PROVIDER_SITE_OTHER): Payer: Medicare HMO | Admitting: Surgery

## 2022-01-05 ENCOUNTER — Encounter: Payer: Self-pay | Admitting: Surgery

## 2022-01-05 ENCOUNTER — Other Ambulatory Visit: Payer: Self-pay

## 2022-01-05 VITALS — BP 103/72 | HR 97 | Temp 97.9°F | Ht 61.0 in | Wt 143.0 lb

## 2022-01-05 DIAGNOSIS — Z09 Encounter for follow-up examination after completed treatment for conditions other than malignant neoplasm: Secondary | ICD-10-CM

## 2022-01-05 NOTE — Patient Instructions (Addendum)
Someone from [facility] will call you to schedule an appointment. Please have the EGD done prior to your October appointment.    Eating Plan After Nissen Fundoplication After a Nissen fundoplication procedure, it is common to have some difficulty swallowing. The part of your body that moves food and liquid from your mouth to your stomach (esophagus) will be swollen and may feel tight. It will take several weeks or months for your esophagus and stomach to heal. By following a special eating plan, you can prevent problems such as pain, swelling or pressure in the abdomen (bloating), gas, nausea, or diarrhea. What are tips for following this plan? Cooking Cook all foods until they are soft. Remove skins and seeds from fruits and vegetables before eating. Remove skin and gristle from meats. Grind or finely mince meats before eating. Avoid over-cooking meat. Dry, tough meat is more difficult to swallow. Avoid using oil when cooking, or use only a small amount of oil. Avoid using seasoning when cooking, or use only a small amount of seasoning. Toast bread before eating. This makes it easier to swallow. Meal planning  Eat 6-8 small meals throughout the day. Right after the surgery, have a few meals that are only clear liquids. Clear liquids include: Water. Clear fruit juice, no pulp. Chicken, beef, or vegetable broth. Gelatin. Decaffeinated tea or coffee without milk. Popsicles or shaved ice. Depending on your progress, you may move to a full liquid diet as told by your health care provider. This includes clear liquids and the following: Dairy and alternative milks, such as soy milk. Strained creamed soups. Ice cream or sherbet. Pudding. Nutritional supplement drinks. Yogurt. A few days after surgery, you may be able to start eating a diet of soft foods. You may need to eat according to this plan for several weeks. Do not eat sweets or sweetened drinks at the beginning of a  meal. Doing that may cause your stomach to empty faster than it should (dumping syndrome). Lifestyle Always sit upright when eating or drinking. Eat slowly. Take small bites and chew food well before swallowing. Do not lie down after eating. Stay sitting up for 30 minutes or longer after each meal. Sip fluids between meals. Limit how much you drink at one time. With meals and snacks, have 4-8 oz (120-240 mL). This is equal to  cup-1 cup. Do not mix solid foods and liquids in the same mouthful. Drink enough fluid to keep your urine pale yellow. Do not chew gum or drink fluids through a straw. Doing those things may cause you to swallow extra air. General information Do not drink carbonated drinks or alcohol. Avoid foods and drinks that contain caffeine and chocolate. Avoid foods and drinks that contain citrus or tomato. Allow hot soups and drinks to cool before eating. Avoid foods that cause gas, such as beans, peas, broccoli, or cabbage. If dairy milk products cause diarrhea, avoid them or eat them in small amounts. Recommended foods Fruits Any soft-cooked fruits after skins and seeds are removed. Fruit juice. Vegetables Any soft-cooked vegetables after skins and seeds are removed. Vegetable juice. Grains Cooked cereals. Dry cereals softened with liquid. Cooked pasta, rice, or other grains. Toasted bread. Bland crackers, such as soda or graham crackers. Meats and other protein foods Tender cuts of meat, poultry, or fish after bones, skin, and gristle are removed. Poached, boiled, or scrambled eggs. Canned fish. Tofu. Creamy nut butters. Dairy Milk. Yogurt. Cottage cheese. Mild cheeses. Beverages Nutritional supplement drinks. Decaffeinated tea or  coffee. Sports drinks. Fats and oils Butter. Margarine. Mayonnaise. Vegetable oil. Smooth salad dressing. Sweets and desserts Plain hard candy. Marshmallows. Pudding. Ice cream. Gelatin. Sherbet. Seasoning and other foods Salt. Light  seasonings. Mustard. Vinegar. The items listed above may not be a complete list of recommended foods and beverages. Contact a dietitian for more information. Foods to avoid Fruits Oranges. Grapefruit. Lemons. Limes. Citrus juices. Dried fruit. Crunchy, raw fruits. Vegetables Tomato sauce. Tomato juice. Broccoli. Cauliflower. Cabbage. Brussels sprouts. Crunchy, raw vegetables. Grains High-fiber or bran cereal. Cereal with nuts, dried fruit, or coconut. Sweet breads, rolls, coffee cake, or donuts. Chewy or crusty breads. Popcorn. Meats and other protein foods Beans, peas, and lentils. Tough or fatty meats. Fried meats, chicken, or fish. Fried eggs. Nuts and seeds. Crunchy nut butters. Dairy Chocolate milk. Yogurt with chunks of fruit, nuts, seeds, or coconut. Strong cheeses. Beverages Carbonated soft drinks. Alcohol. Cocoa. Hot drinks. Fats and oils Bacon fat. Lard. Sweets and desserts Chocolate. Candy with nuts, coconut, or seeds. Peppermint. Cookies. Cakes. Pie crust. Seasoning and other foods Heavy seasonings. Chili sauce. Ketchup. Barbecue sauce. Angie Fava. Horseradish. The items listed above may not be a complete list of foods and beverages to avoid. Contact a dietitian for more information. Summary Following this eating plan after a Nissen fundoplication is an important part of healing after surgery. After surgery, you will start with a clear liquid diet before you progress to full liquids and soft foods. You may need to eat soft foods for several weeks. Avoid eating foods that cause irritation, gas, nausea, diarrhea, or swelling or pressure in the abdomen (bloating), and avoid foods that are difficult to swallow. Talk with a dietitian about which dietary choices are best for you. This information is not intended to replace advice given to you by your health care provider. Make sure you discuss any questions you have with your health care provider. Document Revised: 11/03/2019 Document  Reviewed: 11/03/2019 Elsevier Patient Education  [place]. Laparoscopic Nissen Fundoplication  Laparoscopic Nissen fundoplication is a surgery to relieve heartburn and other problems caused by fluid from your stomach (gastric fluids) flowing up into your esophagus. The esophagus is the part of the body that moves food from the mouth to the stomach. Normally, the muscle that sits between the stomach and the esophagus (lower esophageal sphincter, LES) keeps stomach fluids in the stomach. In some people, the LES does not work properly, and stomach fluids flow up into the esophagus (reflux). This can happen when part of the stomach bulges through the LES (hiatal hernia). The backward flow of stomach fluids can cause a type of severe and long-lasting heartburn that is called gastroesophageal reflux disease (GERD). You may need this surgery if other treatments for GERD have not helped. In this procedure, the upper part of your stomach is wrapped around the lower end of your esophagus and stitched together (sutured). This tightens the connection between your esophagus and stomach to prevent stomach acid reflux. Tell a health care provider about: Any allergies you have. All medicines you are taking, including vitamins, herbs, eye drops, creams, and over-the-counter medicines. Any problems you or family members have had with anesthetic medicines. Any blood disorders you have. Any surgeries you have had. Any medical conditions you have. Whether you are pregnant or may be pregnant. What are the risks? Generally, this is a safe procedure. However, problems may occur, including: Infection. Bleeding. Damage to other structures or organs. This can include damage to the lung, causing a collapsed  lung. Trouble swallowing (dysphagia). Blood clots. Allergic reactions to medicines. What happens before the procedure? Staying hydrated Follow instructions from your health care provider about hydration,  which may include: Up to two hours before the procedure - you may continue to drink clear liquids, such as water, clear fruit juice, black coffee and plain tea. Eating and drinking restrictions Follow instructions from your health care provider about eating and drinking, which may include: 8 hours before the procedure - stop eating heavy meals or foods, such as meat, fried foods, or fatty foods. 6 hours before the procedure - stop eating light meals or foods, such as toast or cereal. 6 hours before the procedure - stop drinking milk or drinks that contain milk. 2 hours before the procedure - stop drinking clear liquids. Medicines Ask your health care provider about: Changing or stopping your regular medicines. This is especially important if you are taking diabetes medicines or blood thinners. Taking medicines such as aspirin and ibuprofen. These medicines can thin your blood. Do not take these medicines unless your health care provider tells you to take them. Taking over-the-counter medicines, vitamins, herbs, and supplements. Tests Your health care provider will do tests to plan the procedure. This may include: An exam using a flexible scope passed down your esophagus into your stomach (endoscopy). Imaging studies. General instructions Plan to have a responsible adult take you home from the hospital or clinic. Ask your health care provider: How your surgery site will be marked. What steps will be taken to help prevent infection. These steps may include: Removing hair at the surgery site. Washing skin with a germ-killing soap. Taking antibiotic medicine. Do not use any products that contain nicotine or tobacco for at least 4 weeks before the procedure. These products include cigarettes, chewing tobacco, and vaping devices, such as e-cigarettes. If you need help quitting, ask your health care provider. What happens during the procedure? An IV will be inserted into one of your veins. You  will be given medicine in your IV to help you relax (sedative) just before the procedure and a medicine to make you fall asleep (general anesthetic). You may have a tube placed through your nose into your stomach to drain stomach acid during the procedure (nasogastric tube). The surgeon will make a small incision in your abdomen and insert a tube through the incision. Your abdomen will be filled with a gas. This helps the surgeon see your organs better, and it makes more space to work. The surgeon will insert a thin, lighted tube (laparoscope) through the small incision. This allows your surgeon to see into your abdomen. The surgeon will make several other small incisions in your abdomen to insert the other instruments that are needed during the procedure. Another instrument (dilator) will be passed through your mouth and down your esophagus into the upper part of your stomach. The dilator will prevent your LES from being closed too tightly during surgery. The upper part of your stomach will be wrapped around the lower part of your esophagus and will be stitched into place. This will strengthen the lower esophageal sphincter and prevent reflux. If you have a hiatal hernia, it will be repaired. The gas will be released from your abdomen. All instruments will be removed, and the incisions will be closed with stitches (sutures). A bandage (dressing) will be placed on your skin over the incisions. The procedure may vary among health care providers and hospitals. What happens after the procedure? Your blood pressure, heart rate,  breathing rate, and blood oxygen level will be monitored until you leave the hospital or clinic. You will be given pain medicine as needed. Your IV will be kept in until you are able to drink fluids. You will be encouraged to get up and walk around as soon as possible. Summary Laparoscopic Nissen fundoplication is a surgery to relieve heartburn and other problems caused by  gastric fluids flowing up into your esophagus. You may need this surgery if other treatments for GERD have not helped. Follow instructions from your health care provider about eating and drinking before the procedure. Your surgeon will use a thin, lighted tube (laparoscope) that is inserted through a small incision, allowing the surgeon to see into your abdomen. This information is not intended to replace advice given to you by your health care provider. Make sure you discuss any questions you have with your health care provider. Document Revised: 11/03/2019 Document Reviewed: 11/03/2019 Elsevier Patient Education  [place].

## 2022-01-07 NOTE — Progress Notes (Signed)
Pt not seen as w/u needs to be completed.

## 2022-01-13 DIAGNOSIS — C44629 Squamous cell carcinoma of skin of left upper limb, including shoulder: Secondary | ICD-10-CM | POA: Diagnosis not present

## 2022-01-13 DIAGNOSIS — R208 Other disturbances of skin sensation: Secondary | ICD-10-CM | POA: Diagnosis not present

## 2022-01-13 DIAGNOSIS — C44722 Squamous cell carcinoma of skin of right lower limb, including hip: Secondary | ICD-10-CM | POA: Diagnosis not present

## 2022-01-13 DIAGNOSIS — D0462 Carcinoma in situ of skin of left upper limb, including shoulder: Secondary | ICD-10-CM | POA: Diagnosis not present

## 2022-01-13 DIAGNOSIS — D485 Neoplasm of uncertain behavior of skin: Secondary | ICD-10-CM | POA: Diagnosis not present

## 2022-01-17 ENCOUNTER — Encounter: Payer: Self-pay | Admitting: Ophthalmology

## 2022-01-18 ENCOUNTER — Encounter: Payer: Self-pay | Admitting: Ophthalmology

## 2022-01-18 DIAGNOSIS — H2511 Age-related nuclear cataract, right eye: Secondary | ICD-10-CM | POA: Diagnosis not present

## 2022-01-19 NOTE — Discharge Instructions (Signed)

## 2022-01-20 ENCOUNTER — Ambulatory Visit: Payer: Medicare HMO | Admitting: Medical

## 2022-01-20 NOTE — Progress Notes (Deleted)
Cardiology Office Note:    Date:  01/20/2022   ID:  Denise Watts, DOB 1955-11-14, MRN [id number]  PCP:  [name], FNP  [facility] Cardiologist:  Kate Sable, MD  [facility] Electrophysiologist:  None   Referring MD: [name], FNP   Chief Complaint:   History of Present Illness:    Denise Watts is a 66 y.o. 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    Previously seen for aortic atherosclerosis in the aorta and calcifications in the pLAD. Lexiscan showed no significant ischemia or scar, normal LVEF, no significant CAD. Echo showed LVEF 60-65%, no WMA, G1DD.  Last seen 12/08/21 and was having shortness of breath. Cardiac CTA was ordered. She was also following with pulmonology. Cardiac CTA showed coronary calcium score of 135, 80th percentile for age and sex matched, mild non-obstructive CAD.   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   Cough 03/02/2017   INTERMTTENT DRY COUGH   Depression    GERD (gastroesophageal reflux disease)    History of hiatal hernia    Personal history of chemotherapy    Personal history of radiation therapy    Skin cancer    Throat cancer ([place]) 1998   radiation   Wears dentures    full upper and lower    Past Surgical History:  Procedure Laterality Date   BREAST BIOPSY Right 1997   positive   BREAST BIOPSY Right 11/15/2017   INVASIVE MAMMARY CARCINOMA triple negative   BREAST BIOPSY Right 07/20/2020   [place] bx, Q marker, neg   BREAST LUMPECTOMY Right 1997   2019 also   BREAST LUMPECTOMY WITH SENTINEL LYMPH NODE BIOPSY Right 12/08/2017   Procedure: BREAST LUMPECTOMY WITH SENTINEL LYMPH NODE BX;  Surgeon: Robert Bellow, MD;  Location: [facility];  Service: General;  Laterality: Right;   BREAST REDUCTION WITH MASTOPEXY Left 06/10/2019   Procedure: Left breast mastopexy reduction for symmetry;  Surgeon: Wallace Going, DO;  Location: [facility];  Service: Plastics;  Laterality: Left;  total 2.5 hours   COLONOSCOPY  2015   COLONOSCOPY WITH PROPOFOL N/A 10/29/2019   Procedure: COLONOSCOPY WITH PROPOFOL;  Surgeon: Lucilla Lame, MD;  Location: [facility];  Service: Endoscopy;  Laterality: N/A;   ESOPHAGOGASTRODUODENOSCOPY (EGD) WITH PROPOFOL N/A 05/24/2019   Procedure: ESOPHAGOGASTRODUODENOSCOPY (EGD) WITH PROPOFOL;  Surgeon: Lucilla Lame, MD;  [facility];  Service: Endoscopy;  Laterality: N/A;   ESOPHAGUS SURGERY     KNEE SURGERY Right    LIPOSUCTION WITH LIPOFILLING Right 06/10/2019   Procedure: right breast scar and capsule release with fat grafting;  Surgeon: Wallace Going, DO;  Location: [facility];  Service: Plastics;  Laterality: Right;   PORT-A-CATH REMOVAL     PORTACATH PLACEMENT Right 01/17/2018   Procedure: INSERTION PORT-A-CATH- RIGHT;  Surgeon: Robert Bellow, MD;  Location: [facility];  Service: General;  Laterality: Right;   ROBOTIC ADRENALECTOMY Right 12/28/2016   Procedure: ROBOTIC ADRENALECTOMY;  Surgeon: Hollice Espy, MD;  Location: [facility];  Service: Urology;  Laterality: Right;   SKIN CANCER EXCISION     THROAT SURGERY  1998   throat cancer    TUBAL LIGATION     VENTRAL HERNIA REPAIR N/A 03/03/2017   Procedure: HERNIA REPAIR VENTRAL ADULT;  Surgeon: Clayburn Pert, MD;  Location: [facility];  Service: General;  Laterality: N/A;    Current Medications: No outpatient medications have been marked as taking for the 01/20/22 encounter (Appointment) with Kathlen Mody, Barbarann Kelly H, PA-C.     Allergies:   Sulfa antibiotics   Social History   Socioeconomic History   Marital status: Married    Spouse name: Not on file   Number  of children: 4   Years of education: Not on file   Highest education level: 10th grade  Occupational History   Occupation: retired  Tobacco Use   Smoking status: Former    Packs/day: 1.50    Years: 30.00    Total pack years: 45.00    Types: Cigarettes    Quit date: 05/03/1995    Years since quitting: 26.7   Smokeless tobacco: Never  Vaping Use   Vaping Use: Never used  Substance and Sexual Activity   Alcohol use: Yes    Alcohol/week: 4.0 standard drinks of alcohol    Types: 4 Shots of liquor per week    Comment: none   Drug use: No   Sexual activity: Not Currently    Birth control/protection: Post-menopausal  Other Topics Concern   Not on file  Social History Narrative   Not on file   Social Determinants of Health   Financial Resource Strain: Low Risk  (12/02/2021)   Overall Financial Resource Strain (CARDIA)    Difficulty of Paying Living Expenses: Not very hard  Food Insecurity: No Food Insecurity (12/02/2021)   Hunger Vital Sign    Worried About Running Out of Food in the Last Year: Never true    Ran Out of Food in the Last Year: Never true  Transportation Needs: No Transportation Needs (12/02/2021)   PRAPARE - Hydrologist (Medical): No    Lack of Transportation (Non-Medical): No  Physical Activity: Insufficiently Active (12/02/2021)   Exercise Vital Sign    Days of Exercise per Week: 3 days    Minutes of Exercise per Session: 30 min  Stress: Stress Concern Present (12/02/2021)   [place]    Feeling of Stress : To some extent  Social Connections: Moderately Isolated (12/02/2021)   Social Connection and Isolation Panel [NHANES]    Frequency of Communication with Friends and Family: Three times a week    Frequency of Social Gatherings with Friends and Family: Twice a week    Attends Religious Services: Never    Marine scientist or Organizations: No    Attends Programme researcher, broadcasting/film/video: Never    Marital Status: Married     Family History: The patient's family history includes Cancer in her paternal aunt, paternal grandmother, and sister; Cancer (age of onset: 69) in her sister; Diabetes in her mother; Heart attack in her mother. There is no history of Prostate cancer, Kidney cancer, or Bladder Cancer.  ROS:   Please see the history of present illness.     All other systems reviewed and are negative.  EKGs/Labs/Other Studies Reviewed:    The following studies were reviewed today:  Cardiac CTA IMPRESSION: 1. Coronary calcium score of 135. This was 80th percentile for age and sex matched control.   2. Normal coronary origin with right dominance.   3. Calcified  plaque causing mild stenosis (25-49%) in the proximal LAD and RCA.   4. CAD-RADS 2. Mild non-obstructive CAD (25-49%). Consider non-atherosclerotic causes of chest pain. Consider preventive therapy and risk factor modification.    EKG:  EKG is *** ordered today.  The ekg ordered today demonstrates ***  Recent Labs: 08/27/2021: NT-Pro BNP 60 11/03/2021: ALT 14; Hemoglobin 14.0; Platelets 216; TSH 0.536 12/08/2021: BUN 22; Creatinine, Ser 0.90; Potassium 4.8; Sodium 139  Recent Lipid Panel    Component Value Date/Time   CHOL 135 05/26/2021 1101   TRIG 115 05/26/2021 1101   HDL 46 05/26/2021 1101   CHOLHDL 2.9 05/26/2021 1101   LDLCALC 68 05/26/2021 1101     Risk Assessment/Calculations:   {Does this patient have ATRIAL FIBRILLATION?[phone]}   Physical Exam:    VS:  There were no vitals taken for this visit.    Wt Readings from Last 3 Encounters:  01/05/22 143 lb (64.9 kg)  12/15/21 140 lb (63.5 kg)  12/08/21 141 lb (64 kg)     GEN: *** Well nourished, well developed in no acute distress HEENT: Normal NECK: No JVD; No carotid bruits LYMPHATICS: No lymphadenopathy CARDIAC: ***RRR, no murmurs, rubs, gallops RESPIRATORY:  Clear to auscultation without rales,  wheezing or rhonchi  ABDOMEN: Soft, non-tender, non-distended MUSCULOSKELETAL:  No edema; No deformity  SKIN: Warm and dry NEUROLOGIC:  Alert and oriented x 3 PSYCHIATRIC:  Normal affect   ASSESSMENT:    No diagnosis found. PLAN:    In order of problems listed above:  ***  Disposition: Follow up {follow up:15908} with ***   Shared Decision Making/Informed Consent   {Are you ordering a CV Procedure (e.g. stress test, cath, DCCV, TEE, etc)?   Press F2        :[id number]}    Signed, Denise Baldassari Ninfa Meeker, PA-C  01/20/2022 8:07 AM    [place]

## 2022-01-21 ENCOUNTER — Encounter: Payer: Self-pay | Admitting: Medical

## 2022-01-21 ENCOUNTER — Ambulatory Visit: Payer: Medicare HMO | Attending: Medical | Admitting: Medical

## 2022-01-21 VITALS — BP 142/84 | HR 89 | Ht 61.0 in | Wt 143.2 lb

## 2022-01-21 DIAGNOSIS — I3139 Other pericardial effusion (noninflammatory): Secondary | ICD-10-CM

## 2022-01-21 DIAGNOSIS — J449 Chronic obstructive pulmonary disease, unspecified: Secondary | ICD-10-CM | POA: Diagnosis not present

## 2022-01-21 DIAGNOSIS — R079 Chest pain, unspecified: Secondary | ICD-10-CM | POA: Diagnosis not present

## 2022-01-21 DIAGNOSIS — I2510 Atherosclerotic heart disease of native coronary artery without angina pectoris: Secondary | ICD-10-CM | POA: Diagnosis not present

## 2022-01-21 DIAGNOSIS — R0609 Other forms of dyspnea: Secondary | ICD-10-CM

## 2022-01-21 NOTE — Anesthesia Preprocedure Evaluation (Addendum)
Anesthesia Evaluation  Patient identified by MRN, date of birth, ID band Patient awake    Reviewed: Allergy & Precautions, H&P , NPO status , Patient's Chart, lab work & pertinent test results, reviewed documented beta blocker date and time   History of Anesthesia Complications Negative for: history of anesthetic complications  Airway Mallampati: II  TM Distance: >3 FB Neck ROM: full    Dental  (+) Dental Advidsory Given, Lower Dentures, Upper Dentures   Pulmonary neg shortness of breath, COPD,  oxygen dependent, neg recent URI, former smoker,    Pulmonary exam normal        Cardiovascular Exercise Tolerance: Good + CAD  Normal cardiovascular exam Rhythm:regular Rate:Normal     Neuro/Psych neg Seizures PSYCHIATRIC DISORDERS Anxiety Depression  Neuromuscular disease    GI/Hepatic Neg liver ROS, hiatal hernia, GERD  Medicated,  Endo/Other  negative endocrine ROS  Renal/GU negative Renal ROS  negative genitourinary   Musculoskeletal   Abdominal   Peds  Hematology negative hematology ROS (+)   Anesthesia Other Findings H/o vocal cord cancer s/p radiation  Past Medical History: No date: Arthritis     Comment:  KNEE RIGHT 1997: Breast cancer ([place])     Comment:  right breast lumpectomy with radiation 11/15/2017: Breast cancer ([place])     Comment:  INVASIVE MAMMARY CARCINOMA/ triple negative 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[place]) 03/02/2017: Cough     Comment:  INTERMTTENT DRY COUGH No date: GERD (gastroesophageal reflux disease) No date: History of hiatal hernia No date: Personal history of chemotherapy No date: Personal history of radiation therapy No date: Skin cancer 1998: Throat cancer [facility])     Comment:  radiation Past Surgical History: 1997: BREAST BIOPSY; Right     Comment:  positive 11/15/2017: BREAST BIOPSY; Right     Comment:  INVASIVE MAMMARY CARCINOMA triple negative 1997: BREAST LUMPECTOMY;  Right     Comment:  2019 also 12/08/2017: BREAST LUMPECTOMY WITH SENTINEL LYMPH NODE BIOPSY; Right     Comment:  Procedure: BREAST LUMPECTOMY WITH SENTINEL LYMPH NODE               BX;  Surgeon: Robert Bellow, MD;  Location: [facility]               ORS;  Service: General;  Laterality: Right; 2015: COLONOSCOPY No date: ESOPHAGUS SURGERY No date: KNEE SURGERY; Right No date: PORT-A-CATH REMOVAL 01/17/2018: PORTACATH PLACEMENT; Right     Comment:  Procedure: INSERTION PORT-A-CATH- RIGHT;  Surgeon:               Robert Bellow, MD;  Location: [place] [facility];  Service:               General;  Laterality: Right; 12/28/2016: ROBOTIC ADRENALECTOMY; Right     Comment:  Procedure: ROBOTIC ADRENALECTOMY;  Surgeon: Hollice Espy, MD;  Location: [facility];  Service: Urology;                Laterality: Right; No date: SKIN CANCER EXCISION 1998: THROAT SURGERY     Comment:  throat cancer  No date: TUBAL LIGATION 03/03/2017: VENTRAL HERNIA REPAIR; N/A     Comment:  Procedure: HERNIA REPAIR VENTRAL ADULT;  Surgeon:               Clayburn Pert, MD;  Location: [facility];  Service:  General;  Laterality: N/A; BMI    Body Mass Index: 23.43 kg/m     Reproductive/Obstetrics negative OB ROS                           Anesthesia Physical  Anesthesia Plan  ASA: 3  Anesthesia Plan: MAC   Post-op Pain Management: Minimal or no pain anticipated   Induction: Intravenous  PONV Risk Score and Plan: Propofol infusion and TIVA  Airway Management Planned: Natural Airway and Nasal Cannula  Additional Equipment:   Intra-op Plan:   Post-operative Plan:   Informed Consent: I have reviewed the patients History and Physical, chart, labs and discussed the procedure including the risks, benefits and alternatives for the proposed anesthesia with the patient or authorized representative who has indicated his/her understanding and acceptance.     Dental  Advisory Given  Plan Discussed with: CRNA  Anesthesia Plan Comments: (Patient consented for risks of anesthesia including but not limited to:  - adverse reactions to medications - risk of airway placement if required - damage to eyes, teeth, lips or other oral mucosa - nerve damage due to positioning  - sore throat or hoarseness - Damage to heart, brain, nerves, lungs, other parts of body or loss of life  Patient voiced understanding.)       Anesthesia Quick Evaluation

## 2022-01-21 NOTE — Progress Notes (Signed)
Cardiology Office Note:    Date:  01/21/2022   ID:  CAYCI MCNABB, DOB 1955/08/08, MRN [id number]  PCP:  [name], FNP  [facility] Cardiologist:  Kate Sable, MD  [place] Electrophysiologist:  None   Referring MD: [name], FNP   Chief Complaint: 6-8 week follow-up  History of Present Illness:    Denise Watts is a 66 y.o. 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    Previously seen for aortic atherosclerosis in the aorta and calcifications in the pLAD. Lexiscan showed no significant ischemia or scar, normal LVEF, no significant CAD. Echo showed LVEF 60-65%, no WMA, G1DD.  Last seen 12/08/21 reporting chest pain/SOB. Cardiac CTA was ordered. Echo was ordered for trace pericardial effusion on Chest CT.   Echo showed LVEF 55-60%, G1DD. Cardiac CTA showed coronary calcium score of 135, mild non-obstructive CAD.  Today, BP is high, but she has not had her medications today. She had been in the ER all night with her husband who will be admitted for internal bleeding/anemia. She has chronic SOB that is uchanged. She is supposed to use O2, but left it at home. She is supposed to use 4L as needed. She denies chest pain. She has right sided trace lower leg edema, however she did have an injury to that side 3-4 weeks ago. She does eat low salt diet. BP is mildly elevated, it is generally lower at home. She is not on antihypertensives at baseline.   Past Medical History:  Diagnosis Date   Anxiety    Arthritis    KNEE RIGHT   Breast cancer ([place]) 1997   right breast lumpectomy with radiation   Breast cancer ([place]) 11/15/2017   INVASIVE MAMMARY CARCINOMA/ triple negative   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[facility]) 11/10/2014   Cough 03/02/2017   INTERMTTENT DRY COUGH   Depression    GERD (gastroesophageal reflux disease)    History of hiatal hernia    Personal history  of chemotherapy    Personal history of radiation therapy    Skin cancer    Throat cancer ([place]) 1998   radiation   Wears dentures    full upper and lower    Past Surgical History:  Procedure Laterality Date   BREAST BIOPSY Right 1997   positive   BREAST BIOPSY Right 11/15/2017   INVASIVE MAMMARY CARCINOMA triple negative   BREAST BIOPSY Right 07/20/2020   [place] bx, Q marker, neg   BREAST LUMPECTOMY Right 1997   2019 also   BREAST LUMPECTOMY WITH SENTINEL LYMPH NODE BIOPSY Right 12/08/2017   Procedure: BREAST LUMPECTOMY WITH SENTINEL LYMPH NODE BX;  Surgeon: Robert Bellow, MD;  Location: [facility];  Service: General;  Laterality: Right;   BREAST REDUCTION WITH MASTOPEXY Left 06/10/2019   Procedure: Left breast mastopexy reduction for symmetry;  Surgeon: Wallace Going, DO;  Location: [place];  Service: Plastics;  Laterality: Left;  total 2.5 hours   COLONOSCOPY  2015   COLONOSCOPY WITH PROPOFOL N/A 10/29/2019   Procedure: COLONOSCOPY WITH PROPOFOL;  Surgeon: Lucilla Lame, MD;  Location: [facility] ENDOSCOPY;  Service: Endoscopy;  Laterality: N/A;   ESOPHAGOGASTRODUODENOSCOPY (EGD) WITH PROPOFOL N/A 05/24/2019   Procedure: ESOPHAGOGASTRODUODENOSCOPY (EGD) WITH PROPOFOL;  Surgeon: Lucilla Lame, MD;  Location: [facility] END[facility]COPY;  Service: Endoscopy;  Laterality: N/A;   ESOPHAGUS SURGERY  KNEE SURGERY Right    LIPOSUCTION WITH LIPOFILLING Right 06/10/2019   Procedure: right breast scar and capsule release with fat grafting;  Surgeon: Wallace Going, DO;  Location: [place];  Service: Plastics;  Laterality: Right;   PORT-A-CATH REMOVAL     PORTACATH PLACEMENT Right 01/17/2018   Procedure: INSERTION PORT-A-CATH- RIGHT;  Surgeon: Robert Bellow, MD;  Location: [place] [facility];  Service: General;  Laterality: Right;   ROBOTIC ADRENALECTOMY Right 12/28/2016   Procedure: ROBOTIC ADRENALECTOMY;  Surgeon: Hollice Espy, MD;  Location: [facility];  Service:  Urology;  Laterality: Right;   SKIN CANCER EXCISION     THROAT SURGERY  1998   throat cancer    TUBAL LIGATION     VENTRAL HERNIA REPAIR N/A 03/03/2017   Procedure: HERNIA REPAIR VENTRAL ADULT;  Surgeon: Clayburn Pert, MD;  Location: [facility];  Service: General;  Laterality: N/A;    Current Medications: Current Meds  Medication Sig   albuterol (VENTOLIN HFA) 108 (90 Base) MCG/ACT inhaler Inhale 2 puffs into the lungs every 6 (six) hours as needed for shortness of breath.   alum & mag hydroxide-simeth (MAALOX PLUS) 400-400-40 MG/5ML suspension Take 15 mLs by mouth every 6 (six) hours as needed for indigestion.   aspirin EC 81 MG tablet Take 1 tablet (81 mg total) by mouth daily. Swallow whole.   benzonatate (TESSALON) 100 MG capsule Take 2 capsules (200 mg total) by mouth 3 (three) times daily.   brompheniramine-pseudoephedrine-DM 30-2-10 MG/5ML syrup Take 5 mLs by mouth 4 (four) times daily as needed.   Budeson-Glycopyrrol-Formoterol (BREZTRI AEROSPHERE) 160-9-4.8 MCG/ACT AERO Inhale 2 puffs into the lungs in the morning and at bedtime.   buPROPion (WELLBUTRIN) 75 MG tablet Take 75 mg by mouth 2 (two) times daily.   busPIRone (BUSPAR) 5 MG tablet Take 1 tablet (5 mg total) by mouth 2 (two) times daily.   cyclobenzaprine (FLEXERIL) 5 MG tablet TAKE 1 TABLET BY MOUTH AT BEDTIME   doxycycline (VIBRA-TABS) 100 MG tablet Take 1 tablet (100 mg total) by mouth 2 (two) times daily.   DULoxetine (CYMBALTA) 60 MG capsule Take 1 capsule (60 mg total) by mouth daily.   fluticasone (FLONASE) 50 MCG/ACT nasal spray Place 1 spray into both nostrils at bedtime.   gabapentin (NEURONTIN) 300 MG capsule TAKE 1 CAPSULE BY MOUTH 3 TIMES DAILY   guaiFENesin-codeine 100-10 MG/5ML syrup Take 10 mLs by mouth at bedtime and may repeat dose one time if needed.   guaiFENesin-dextromethorphan (ROBITUSSIN DM) 100-10 MG/5ML syrup Take 5 mLs by mouth every 4 (four) hours as needed for cough.   montelukast (SINGULAIR)  10 MG tablet Take 1 tablet (10 mg total) by mouth at bedtime.   omeprazole (PRILOSEC) 40 MG capsule Take 40 mg by mouth daily.   oxybutynin (DITROPAN XL) 15 MG 24 hr tablet Take 1 tablet (15 mg total) by mouth daily.   predniSONE (DELTASONE) 10 MG tablet Day 1 & 2 take 6 tablets Day 3 &4 take 5 tablets Day 5 &6 take 4 tablets Day 7 & 8 take 3 tablets Day 9 & 10 take 2 tablets Day 11 & 12 take 1 tablet Day 13 & 14 take 1/2 tablet   rOPINIRole (REQUIP) 0.25 MG tablet Take 1 tablet (0.25 mg total) by mouth at bedtime.   rosuvastatin (CRESTOR) 40 MG tablet Take 1 tablet (40 mg total) by mouth daily.   sucralfate (CARAFATE) 1 g tablet Take 1 tablet (1 g total) by mouth 4 (four)  times daily -  with meals and at bedtime.     Allergies:   Sulfa antibiotics   Social History   Socioeconomic History   Marital status: Married    Spouse name: Not on file   Number of children: 4   Years of education: Not on file   Highest education level: 10th grade  Occupational History   Occupation: retired  Tobacco Use   Smoking status: Former    Packs/day: 1.50    Years: 30.00    Total pack years: 45.00    Types: Cigarettes    Quit date: 05/03/1995    Years since quitting: 26.7   Smokeless tobacco: Never  Vaping Use   Vaping Use: Never used  Substance and Sexual Activity   Alcohol use: Not Currently    Alcohol/week: 4.0 standard drinks of alcohol    Types: 4 Shots of liquor per week    Comment: none   Drug use: No   Sexual activity: Not Currently    Birth control/protection: Post-menopausal  Other Topics Concern   Not on file  Social History Narrative   Not on file   Social Determinants of Health   Financial Resource Strain: Low Risk  (12/02/2021)   Overall Financial Resource Strain (CARDIA)    Difficulty of Paying Living Expenses: Not very hard  Food Insecurity: No Food Insecurity (12/02/2021)   Hunger Vital Sign    Worried About Running Out of Food in the Last Year: Never true    Ran Out of  Food in the Last Year: Never true  Transportation Needs: No Transportation Needs (12/02/2021)   PRAPARE - Hydrologist (Medical): No    Lack of Transportation (Non-Medical): No  Physical Activity: Insufficiently Active (12/02/2021)   Exercise Vital Sign    Days of Exercise per Week: 3 days    Minutes of Exercise per Session: 30 min  Stress: Stress Concern Present (12/02/2021)   [place]    Feeling of Stress : To some extent  Social Connections: Moderately Isolated (12/02/2021)   Social Connection and Isolation Panel [NHANES]    Frequency of Communication with Friends and Family: Three times a week    Frequency of Social Gatherings with Friends and Family: Twice a week    Attends Religious Services: Never    Marine scientist or Organizations: No    Attends Music therapist: Never    Marital Status: Married     Family History: The patient's family history includes Cancer in her paternal aunt, paternal grandmother, and sister; Cancer (age of onset: 48) in her sister; Diabetes in her mother; Heart attack in her mother. There is no history of Prostate cancer, Kidney cancer, or Bladder Cancer.  ROS:   Please see the history of present illness.     All other systems reviewed and are negative.  EKGs/Labs/Other Studies Reviewed:    The following studies were reviewed today:  Echo 11/2020  1. Left ventricular ejection fraction, by estimation, is 60 to 65%. The  left ventricle has normal function. The left ventricle has no regional  wall motion abnormalities. Left ventricular diastolic parameters are  consistent with Grade I diastolic  dysfunction (impaired relaxation).   2. Right ventricular systolic function is normal. The right ventricular  size is normal.   3. The mitral valve is normal in structure. No evidence of mitral valve  regurgitation.   4. The aortic valve is  tricuspid. Aortic valve regurgitation is not  visualized.    Myoview 09/2020 Narrative & Impression  Normal pharmacologic myocardial perfusion stress test without significant ischemia or scar. The left ventricular ejection fraction is normal (62%). Aortic atherosclerosis noted on the attenuation correction CT. There is no significant coronary artery calcification. Left lower lobe lung nodule measuring ~1 cm is stable compared with dedicated chest CT from 08/21/2020. This is a low risk study. Sensitivity and specificity of the study are degraded by significant extracardiac activity.        EKG:  EKG is not ordered today.    Recent Labs: 08/27/2021: NT-Pro BNP 60 11/03/2021: ALT 14; Hemoglobin 14.0; Platelets 216; TSH 0.536 12/08/2021: BUN 22; Creatinine, Ser 0.90; Potassium 4.8; Sodium 139  Recent Lipid Panel    Component Value Date/Time   CHOL 135 05/26/2021 1101   TRIG 115 05/26/2021 1101   HDL 46 05/26/2021 1101   CHOLHDL 2.9 05/26/2021 1101   LDLCALC 68 05/26/2021 1101    Physical Exam:    VS:  BP (!) 142/84 (BP Location: Left Arm, Patient Position: Sitting, Cuff Size: Normal)   Pulse 89   Ht '5\' 1"'$  (1.549 m)   Wt 143 lb 3.2 oz (65 kg)   SpO2 97%   BMI 27.06 kg/m     Wt Readings from Last 3 Encounters:  01/21/22 143 lb 3.2 oz (65 kg)  01/05/22 143 lb (64.9 kg)  12/15/21 140 lb (63.5 kg)     GEN:  Well nourished, well developed in no acute distress HEENT: Normal NECK: No JVD; No carotid bruits LYMPHATICS: No lymphadenopathy CARDIAC: RRR, no murmurs, rubs, gallops RESPIRATORY:  Clear to auscultation without rales, wheezing or rhonchi  ABDOMEN: Soft, non-tender, non-distended MUSCULOSKELETAL:  right side trace lower leg edema; No deformity  SKIN: Warm and dry NEUROLOGIC:  Alert and oriented x 3 PSYCHIATRIC:  Normal affect   ASSESSMENT:    1. Chest pain of uncertain etiology   2. Coronary artery disease involving native coronary artery of native heart without  angina pectoris   3. Chronic obstructive pulmonary disease, unspecified COPD type ([place])   4. Dyspnea on exertion   5. Pericardial effusion    PLAN:    In order of problems listed above:  Chest pain Coronary calcifications Cardiac CTA showed mild nonobstructive CAD. She denies further chest pain. Continue Aspirin and Crestor. No further ischemic work-up indicated at this time.   COPD/Chronic DOE She follows with pulmonology. Breathing is unchanged today. She uses 4L O2 as needed at home.   Pericardial effusion Follow-up echo showed no effusion. No further work-up indicated.   Disposition: Follow up in 3 month(s) with MD/APP     Signed, Khadijatou Borak Ninfa Meeker, PA-C  01/21/2022 11:38 AM    [facility]

## 2022-01-21 NOTE — Patient Instructions (Signed)
Medication Instructions:   Your physician recommends that you continue on your current medications as directed. Please refer to the Current Medication list given to you today.   *If you need a refill on your cardiac medications before your next appointment, please call your pharmacy*   Follow-Up: At [facility], you and your health needs are our priority.  As part of our continuing mission to provide you with exceptional heart care, we have created designated Provider Care Teams.  These Care Teams include your primary Cardiologist (physician) and Advanced Practice Providers (APPs -  Physician Assistants and Nurse Practitioners) who all work together to provide you with the care you need, when you need it.  We recommend signing up for the patient portal called "MyChart".  Sign up information is provided on this After Visit Summary.  MyChart is used to connect with patients for Virtual Visits (Telemedicine).  Patients are able to view lab/test results, encounter notes, upcoming appointments, etc.  Non-urgent messages can be sent to your provider as well.   To learn more about what you can do with MyChart, go to [email or web address].com.    Your next appointment:   3 month(s)  The format for your next appointment:   In Person  Provider:   You may see Brian Agbor-Etang, MD or one of the following Advanced Practice Providers on your designated Care Team:   Christopher Berge, NP Ryan Dunn, PA-C Cadence Furth, PA-C Sheri Hammock, NP    Other Instructions   Important Information About Sugar       

## 2022-01-24 NOTE — Progress Notes (Unsigned)
01/25/2022 2:11 PM   Denise Watts 10-Jan-1956 [id number]  Referring provider: [name], [address],  [place] [address]  Urological history: 1. OAB wet -contributing factors of age, smoking, vaginal atrophy, COPD, anxiety, depression, parkinsonism, pre-diabetic and muscle relaxer's -cysto 2022- NED -PVR 0 mL -failed Gemtesa 75 mg daily -Managed with oxybutynin XL 15 mg daily   2. Stress incontinence -contributing factors of age and vaginal atrophy   3. Right adrenal mass -s/p right adrenalectomy 2018 - pathology positive for high grade carcinoma with extensive necrosis - surgical margins negative  -followed by the Cancer center    Chief Complaint  Patient presents with   Over Active Bladder         HPI: Denise Watts is a 66 y.o. female who presents today for difficulty urinating and a bad discharge.  A CT scan with contrast (11/2021) ordered by her general surgeon noted Indeterminate 1.5 cm (from 1.3 cm) left renal lesion. Finding could represent complex cyst versus solid mass. When the patient is clinically stable and able to follow directions and hold their breath (preferably as an outpatient) further evaluation with dedicated abdominal MRI renal protocol should be considered.  She has been having a several week history of clear vaginal discharge, dysuria and suprapubic discomfort.  She states the reason why she has put off seeking treatment for this since she has been taking care of her chronically ill husband.  Patient denies any modifying or aggravating factors.  Patient denies any gross hematuria or flank pain.  Patient denies any fevers, chills, nausea or vomiting.    She is not sexually active and she denies constipation or diarrhea.  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   Cough 03/02/2017   INTERMTTENT DRY COUGH   Depression    GERD (gastroesophageal reflux disease)    History of hiatal hernia    Personal history of chemotherapy    Personal history of radiation therapy    Skin cancer    Throat cancer ([facility]) 1998   radiation   Wears dentures    full upper and lower    Surgical History: Past Surgical History:  Procedure Laterality Date   BREAST BIOPSY Right 1997   positive   BREAST BIOPSY Right 11/15/2017   INVASIVE MAMMARY CARCINOMA triple negative   BREAST BIOPSY Right 07/20/2020   [place] bx, Q marker, neg   BREAST LUMPECTOMY Right 1997   2019 also   BREAST LUMPECTOMY WITH SENTINEL LYMPH NODE BIOPSY Right 12/08/2017   Procedure: BREAST LUMPECTOMY WITH SENTINEL LYMPH NODE BX;  Surgeon: Robert Bellow, MD;  Location: [facility];  Service: General;  Laterality: Right;   BREAST REDUCTION WITH MASTOPEXY Left 06/10/2019   Procedure: Left breast mastopexy reduction for symmetry;  Surgeon: Wallace Going, DO;  Location: [facility];  Service: Plastics;  Laterality: Left;  total 2.5 hours   COLONOSCOPY  2015   COLONOSCOPY WITH PROPOFOL N/A 10/29/2019   Procedure: COLONOSCOPY WITH PROPOFOL;  Surgeon: Lucilla Lame, MD;  Location: [facility];  Service: Endoscopy;  Laterality: N/A;   ESOPHAGOGASTRODUODENOSCOPY (EGD) WITH PROPOFOL N/A 05/24/2019   Procedure: ESOPHAGOGASTRODUODENOSCOPY (  EGD) WITH PROPOFOL;  Surgeon: Lucilla Lame, MD;  Location: [facility];  Service: Endoscopy;  Laterality: N/A;   ESOPHAGUS SURGERY     KNEE SURGERY Right    LIPOSUCTION WITH LIPOFILLING Right 06/10/2019   Procedure: right breast scar and capsule release with fat grafting;  Surgeon: Wallace Going, DO;  Location: [facility];  Service: Plastics;  Laterality: Right;   PORT-A-CATH REMOVAL     PORTACATH PLACEMENT  Right 01/17/2018   Procedure: INSERTION PORT-A-CATH- RIGHT;  Surgeon: Robert Bellow, MD;  Location: [facility];  Service: General;  Laterality: Right;   ROBOTIC ADRENALECTOMY Right 12/28/2016   Procedure: ROBOTIC ADRENALECTOMY;  Surgeon: Hollice Espy, MD;  Location: [facility];  Service: Urology;  Laterality: Right;   SKIN CANCER EXCISION     THROAT SURGERY  1998   throat cancer    TUBAL LIGATION     VENTRAL HERNIA REPAIR N/A 03/03/2017   Procedure: HERNIA REPAIR VENTRAL ADULT;  Surgeon: Clayburn Pert, MD;  Location: [facility];  Service: General;  Laterality: N/A;    Home Medications:  Allergies as of 01/25/2022       Reactions   Sulfa Antibiotics Other (See Comments)   Mouth sore and raw        Medication List        Accurate as of January 25, 2022  2:11 PM. If you have any questions, ask your nurse or doctor.          albuterol 108 (90 Base) MCG/ACT inhaler Commonly known as: VENTOLIN HFA Inhale 2 puffs into the lungs every 6 (six) hours as needed for shortness of breath.   alum & mag hydroxide-simeth 025-852-77 MG/5ML suspension Commonly known as: MAALOX PLUS Take 15 mLs by mouth every 6 (six) hours as needed for indigestion.   aspirin EC 81 MG tablet Take 1 tablet (81 mg total) by mouth daily. Swallow whole.   benzonatate 100 MG capsule Commonly known as: TESSALON Take 2 capsules (200 mg total) by mouth 3 (three) times daily.   Breztri Aerosphere 160-9-4.8 MCG/ACT Aero Generic drug: Budeson-Glycopyrrol-Formoterol Inhale 2 puffs into the lungs in the morning and at bedtime.   brompheniramine-pseudoephedrine-DM 30-2-10 MG/5ML syrup Take 5 mLs by mouth 4 (four) times daily as needed.   buPROPion 75 MG tablet Commonly known as: WELLBUTRIN Take 75 mg by mouth 2 (two) times daily.   busPIRone 5 MG tablet Commonly known as: BUSPAR Take 1 tablet (5 mg total) by mouth 2 (two) times daily.   cyclobenzaprine 5 MG tablet Commonly known as: FLEXERIL TAKE  1 TABLET BY MOUTH AT BEDTIME   doxycycline 100 MG tablet Commonly known as: VIBRA-TABS Take 1 tablet (100 mg total) by mouth 2 (two) times daily.   DULoxetine 60 MG capsule Commonly known as: CYMBALTA Take 1 capsule (60 mg total) by mouth daily.   fluconazole 150 MG tablet Commonly known as: DIFLUCAN Take one by mouth for vaginal yeast; repeat in 4 days if symptoms remain   fluticasone 50 MCG/ACT nasal spray Commonly known as: FLONASE Place 1 spray into both nostrils at bedtime.   gabapentin 300 MG capsule Commonly known as: NEURONTIN TAKE 1 CAPSULE BY MOUTH 3 TIMES DAILY   guaiFENesin-codeine 100-10 MG/5ML syrup Take 10 mLs by mouth at bedtime and may repeat dose one time if needed.   guaiFENesin-dextromethorphan 100-10 MG/5ML syrup Commonly known as: ROBITUSSIN DM Take 5 mLs by mouth every 4 (four) hours as needed for cough.   montelukast 10 MG tablet  Commonly known as: SINGULAIR Take 1 tablet (10 mg total) by mouth at bedtime.   nitrofurantoin (macrocrystal-monohydrate) 100 MG capsule Commonly known as: MACROBID Take 1 capsule (100 mg total) by mouth every 12 (twelve) hours. Started by: Zara Council, PA-C   omeprazole 40 MG capsule Commonly known as: PRILOSEC Take 40 mg by mouth daily.   oxybutynin 15 MG 24 hr tablet Commonly known as: DITROPAN XL Take 1 tablet (15 mg total) by mouth daily.   predniSONE 10 MG tablet Commonly known as: DELTASONE Day 1 & 2 take 6 tablets Day 3 &4 take 5 tablets Day 5 &6 take 4 tablets Day 7 & 8 take 3 tablets Day 9 & 10 take 2 tablets Day 11 & 12 take 1 tablet Day 13 & 14 take 1/2 tablet   rOPINIRole 0.25 MG tablet Commonly known as: REQUIP Take 1 tablet (0.25 mg total) by mouth at bedtime.   rosuvastatin 40 MG tablet Commonly known as: CRESTOR Take 1 tablet (40 mg total) by mouth daily.   sucralfate 1 g tablet Commonly known as: CARAFATE Take 1 tablet (1 g total) by mouth 4 (four) times daily -  with meals and at  bedtime.        Allergies:  Allergies  Allergen Reactions   Sulfa Antibiotics Other (See Comments)    Mouth sore and raw    Family History: Family History  Problem Relation Age of Onset   Diabetes Mother    Heart attack Mother    Cancer Sister        cancer of eyes, spread to lung, other places. died at 90   Cancer Sister 31       unknown primary, spread to bone, brain died in 11's   Cancer Paternal Aunt        unk type   Cancer Paternal Grandmother        type unk   Prostate cancer Neg Hx    Kidney cancer Neg Hx    Bladder Cancer Neg Hx     Social History:  reports that she quit smoking about 26 years ago. Her smoking use included cigarettes. She has a 45.00 pack-year smoking history. She has never used smokeless tobacco. She reports that she does not currently use alcohol after a past usage of about 4.0 standard drinks of alcohol per week. She reports that she does not use drugs.  ROS: Pertinent ROS in HPI  Physical Exam: BP 121/85   Pulse 90   Ht '5\' 1"'$  (1.549 m)   Wt 141 lb (64 kg)   BMI 26.64 kg/m   Constitutional:  Well nourished. Alert and oriented, No acute distress. HEENT: [place] AT, moist mucus membranes.  Trachea midline, no masses. Cardiovascular: No clubbing, cyanosis, or edema. Respiratory: Normal respiratory effort, no increased work of breathing. GI: Abdomen is soft, non tender, non distended, no abdominal masses. Liver and spleen not palpable.  No hernias appreciated.  Stool sample for occult testing is not indicated.   GU: No CVA tenderness.  No bladder fullness or masses.  Atrophic external genitalia, normal pubic hair distribution, no lesions.  Normal urethral meatus, no lesions, no prolapse, no discharge.   No urethral masses, tenderness and/or tenderness. No bladder fullness, tenderness or masses. Pale vagina mucosa, fair estrogen effect, no discharge, no lesions, fair pelvic support, cystocele and no rectocele noted.  Urine leakage is noted upon  Valsalva.  No cervical motion tenderness.  Anus and perineum are without rashes or lesions.  Neurologic: Grossly intact, no focal deficits, moving all 4 extremities. Psychiatric: Normal mood and affect.    Laboratory Data: Lab Results  Component Value Date   WBC 5.5 11/03/2021   HGB 14.0 11/03/2021   HCT 42.1 11/03/2021   MCV 97 11/03/2021   PLT 216 11/03/2021    Lab Results  Component Value Date   CREATININE 0.90 12/08/2021    Lab Results  Component Value Date   HGBA1C 5.7 (H) 11/03/2021    Lab Results  Component Value Date   TSH 0.536 11/03/2021       Component Value Date/Time   CHOL 135 05/26/2021 1101   HDL 46 05/26/2021 1101   CHOLHDL 2.9 05/26/2021 1101   LDLCALC 68 05/26/2021 1101    Lab Results  Component Value Date   AST 16 11/03/2021   Lab Results  Component Value Date   ALT 14 11/03/2021    Urinalysis See Epic and HPI I have reviewed the labs.   Pertinent Imaging: CLINICAL DATA:  abdominal pain   EXAM: CT ABDOMEN AND PELVIS WITH CONTRAST   TECHNIQUE: Multidetector CT imaging of the abdomen and pelvis was performed using the standard protocol following bolus administration of intravenous contrast.   RADIATION DOSE REDUCTION: This exam was performed according to the departmental dose-optimization program which includes automated exposure control, adjustment of the mA and/or kV according to patient size and/or use of iterative reconstruction technique.   CONTRAST:  187m OMNIPAQUE IOHEXOL 350 MG/ML SOLN   COMPARISON:  None Available.   FINDINGS: Lower chest: Left base atelectasis. Partially visualized right breast fat density lesion measuring up to 4.7 cm with associated vascular clips adjacent.   Hepatobiliary: No focal liver abnormality. No gallstones, gallbladder wall thickening, or pericholecystic fluid. No biliary dilatation.   Pancreas: No focal lesion. Normal pancreatic contour. No surrounding inflammatory changes. No  main pancreatic ductal dilatation.   Spleen: Normal in size without focal abnormality.   Adrenals/Urinary Tract:   Status post right adrenalectomy.  No left adrenal gland nodule.   Bilateral kidneys enhance symmetrically. Slight interval increase in size of a 1.5 cm (from 1.3 cm) left renal hypodense lesion with a density of 25 Hounsfield units. Subcentimeter hypodensities are too small to characterize.   No hydronephrosis. No hydroureter.   The urinary bladder is unremarkable.   Stomach/Bowel: Query surgical changes along the gastroesophageal junction/distal esophagus. Stomach is within normal limits. No evidence of bowel wall thickening or dilatation. Colonic diverticulosis. Appendix appears normal.   Vascular/Lymphatic: No abdominal aorta or iliac aneurysm. Severe atherosclerotic plaque of the aorta and its branches. No abdominal, pelvic, or inguinal lymphadenopathy.   Reproductive: Uterus and bilateral adnexa are unremarkable.   Other: No intraperitoneal free fluid. No intraperitoneal free gas. No organized fluid collection.   Musculoskeletal:   No abdominal wall hernia or abnormality.   No suspicious lytic or blastic osseous lesions. No acute displaced fracture.   IMPRESSION: 1. Indeterminate 1.5 cm (from 1.3 cm) left renal lesion. Finding could represent complex cyst versus solid mass. When the patient is clinically stable and able to follow directions and hold their breath (preferably as an outpatient) further evaluation with dedicated abdominal MRI renal protocol should be considered. 2. Colonic diverticulosis with no acute diverticulitis. 3.  Aortic Atherosclerosis (ICD10-I70.0).     Electronically Signed   By: MIven FinnM.D.   On: 12/09/2021 22:44 I have independently reviewed the films.    In and Out Catheterization Patient was cleaned and prepped in a sterile fashion  with betadine . A 14 FR cath was inserted no complications were noted , 50 ml  of urine return was noted, urine was yellow clear in color. A clean urine sample was collected for UA and UCX. Bladder was drained  And catheter was removed with out difficulty.    Performed by: Zara Council, PA-C   Assessment & Plan:    1.  Dysuria -CATH UA 6-10 WBC's, nitrite positive and many bacteria -urine culture -Macrobid 100 mg twice daily sent to pharmacy, will adjust if necessary once sensitivities have returned  2. Vaginal discharge -likely physiological   3.  Indeterminate left renal lesion -MRI renal protocol was ordered  Return in about 2 weeks (around 02/08/2022) for MRI report .  These notes generated with voice recognition software. I apologize for typographical errors.  [place], [place] [address]  [place], [place] [address] [phone]

## 2022-01-25 ENCOUNTER — Ambulatory Visit: Payer: Medicare HMO | Admitting: Urology

## 2022-01-25 ENCOUNTER — Encounter: Payer: Self-pay | Admitting: Urology

## 2022-01-25 ENCOUNTER — Other Ambulatory Visit: Payer: Self-pay | Admitting: Family Medicine

## 2022-01-25 VITALS — BP 121/85 | HR 90 | Ht 61.0 in | Wt 141.0 lb

## 2022-01-25 DIAGNOSIS — R300 Dysuria: Secondary | ICD-10-CM

## 2022-01-25 DIAGNOSIS — N2889 Other specified disorders of kidney and ureter: Secondary | ICD-10-CM | POA: Diagnosis not present

## 2022-01-25 DIAGNOSIS — R39198 Other difficulties with micturition: Secondary | ICD-10-CM

## 2022-01-25 DIAGNOSIS — D0471 Carcinoma in situ of skin of right lower limb, including hip: Secondary | ICD-10-CM | POA: Diagnosis not present

## 2022-01-25 DIAGNOSIS — D0461 Carcinoma in situ of skin of right upper limb, including shoulder: Secondary | ICD-10-CM | POA: Diagnosis not present

## 2022-01-25 DIAGNOSIS — N898 Other specified noninflammatory disorders of vagina: Secondary | ICD-10-CM

## 2022-01-25 DIAGNOSIS — N289 Disorder of kidney and ureter, unspecified: Secondary | ICD-10-CM

## 2022-01-25 DIAGNOSIS — D0462 Carcinoma in situ of skin of left upper limb, including shoulder: Secondary | ICD-10-CM | POA: Diagnosis not present

## 2022-01-25 LAB — MICROSCOPIC EXAMINATION

## 2022-01-25 LAB — URINALYSIS, COMPLETE
Bilirubin, UA: NEGATIVE
Glucose, UA: NEGATIVE
Ketones, UA: NEGATIVE
Nitrite, UA: POSITIVE — AB
Protein,UA: NEGATIVE
RBC, UA: NEGATIVE
Specific Gravity, UA: 1.02 (ref 1.005–1.030)
Urobilinogen, Ur: 0.2 mg/dL (ref 0.2–1.0)
pH, UA: 7 (ref 5.0–7.5)

## 2022-01-25 MED ORDER — NITROFURANTOIN MONOHYD MACRO 100 MG PO CAPS: 100.0000 mg | ORAL_CAPSULE | Freq: Two times a day (BID) | ORAL | 0 refills | Status: DC

## 2022-01-28 ENCOUNTER — Ambulatory Visit (INDEPENDENT_AMBULATORY_CARE_PROVIDER_SITE_OTHER): Payer: Medicare HMO | Admitting: Physician Assistant

## 2022-01-28 ENCOUNTER — Encounter: Payer: Self-pay | Admitting: Physician Assistant

## 2022-01-28 VITALS — BP 115/83 | HR 90 | Resp 16 | Wt 143.3 lb

## 2022-01-28 DIAGNOSIS — M79604 Pain in right leg: Secondary | ICD-10-CM | POA: Diagnosis not present

## 2022-01-28 DIAGNOSIS — M25561 Pain in right knee: Secondary | ICD-10-CM

## 2022-01-28 DIAGNOSIS — R200 Anesthesia of skin: Secondary | ICD-10-CM | POA: Diagnosis not present

## 2022-01-28 DIAGNOSIS — M720 Palmar fascial fibromatosis [Dupuytren]: Secondary | ICD-10-CM

## 2022-01-28 DIAGNOSIS — M1711 Unilateral primary osteoarthritis, right knee: Secondary | ICD-10-CM | POA: Diagnosis not present

## 2022-01-28 DIAGNOSIS — S63601A Unspecified sprain of right thumb, initial encounter: Secondary | ICD-10-CM | POA: Diagnosis not present

## 2022-01-28 LAB — CULTURE, URINE COMPREHENSIVE

## 2022-01-28 NOTE — Progress Notes (Unsigned)
Established patient visit  I,April Miller,acting as a scribe for [employer], PA-C.,have documented all relevant documentation on the behalf of Mardene Speak, PA-C,as directed by  [employer], PA-C while in the presence of [employer], PA-C.   Patient: Denise Watts   DOB: 1956/04/06   66 y.o. Female  MRN: [id number] Visit Date: 01/28/2022  Today's healthcare provider: Mardene Speak, PA-C   Chief Complaint  Patient presents with   Edema   Subjective    HPI  Patient has had swelling right leg and foot for several days. Patient states swelling is not as bad today. Patient also has tingling in her legs. Which she believes is a symptoms of chronic back pain. Reports a fall two weeks ago. Right knee is somewhat swollen and painful as well as right thigh Reports having right wrist pain and chronic numbness and tingling in right arm Medications: Outpatient Medications Prior to Visit  Medication Sig   albuterol (VENTOLIN HFA) 108 (90 Base) MCG/ACT inhaler Inhale 2 puffs into the lungs every 6 (six) hours as needed for shortness of breath.   alum & mag hydroxide-simeth (MAALOX PLUS) 400-400-40 MG/5ML suspension Take 15 mLs by mouth every 6 (six) hours as needed for indigestion.   aspirin EC 81 MG tablet Take 1 tablet (81 mg total) by mouth daily. Swallow whole.   benzonatate (TESSALON) 100 MG capsule Take 2 capsules (200 mg total) by mouth 3 (three) times daily.   brompheniramine-pseudoephedrine-DM 30-2-10 MG/5ML syrup Take 5 mLs by mouth 4 (four) times daily as needed.   Budeson-Glycopyrrol-Formoterol (BREZTRI AEROSPHERE) 160-9-4.8 MCG/ACT AERO Inhale 2 puffs into the lungs in the morning and at bedtime.   buPROPion (WELLBUTRIN) 75 MG tablet Take 75 mg by mouth 2 (two) times daily.   busPIRone (BUSPAR) 5 MG tablet Take 1 tablet (5 mg total) by mouth 2 (two) times daily.   cyclobenzaprine (FLEXERIL) 5 MG tablet TAKE 1 TABLET BY MOUTH AT BEDTIME   DULoxetine (CYMBALTA) 60 MG capsule  Take 1 capsule (60 mg total) by mouth daily.   fluconazole (DIFLUCAN) 150 MG tablet Take one by mouth for vaginal yeast; repeat in 4 days if symptoms remain   fluticasone (FLONASE) 50 MCG/ACT nasal spray Place 1 spray into both nostrils at bedtime.   gabapentin (NEURONTIN) 300 MG capsule TAKE 1 CAPSULE BY MOUTH 3 TIMES DAILY   guaiFENesin-codeine 100-10 MG/5ML syrup Take 10 mLs by mouth at bedtime and may repeat dose one time if needed.   guaiFENesin-dextromethorphan (ROBITUSSIN DM) 100-10 MG/5ML syrup Take 5 mLs by mouth every 4 (four) hours as needed for cough.   montelukast (SINGULAIR) 10 MG tablet Take 1 tablet (10 mg total) by mouth at bedtime.   nitrofurantoin, macrocrystal-monohydrate, (MACROBID) 100 MG capsule Take 1 capsule (100 mg total) by mouth every 12 (twelve) hours.   omeprazole (PRILOSEC) 40 MG capsule Take 40 mg by mouth daily.   oxybutynin (DITROPAN XL) 15 MG 24 hr tablet Take 1 tablet (15 mg total) by mouth daily.   rOPINIRole (REQUIP) 0.25 MG tablet Take 1 tablet (0.25 mg total) by mouth at bedtime.   rosuvastatin (CRESTOR) 40 MG tablet Take 1 tablet (40 mg total) by mouth daily.   sucralfate (CARAFATE) 1 g tablet Take 1 tablet (1 g total) by mouth 4 (four) times daily -  with meals and at bedtime.   [DISCONTINUED] doxycycline (VIBRA-TABS) 100 MG tablet Take 1 tablet (100 mg total) by mouth 2 (two) times daily. (Patient not taking:  Reported on 01/28/2022)   [DISCONTINUED] predniSONE (DELTASONE) 10 MG tablet Day 1 & 2 take 6 tablets Day 3 &4 take 5 tablets Day 5 &6 take 4 tablets Day 7 & 8 take 3 tablets Day 9 & 10 take 2 tablets Day 11 & 12 take 1 tablet Day 13 & 14 take 1/2 tablet (Patient not taking: Reported on 01/28/2022)   Facility-Administered Medications Prior to Visit  Medication Dose Route Frequency Provider   cyanocobalamin ((VITAMIN B-12)) injection 1,000 mcg  1,000 mcg Intramuscular Q30 days Sindy Guadeloupe, MD   cyanocobalamin ((VITAMIN B-12)) injection 1,000 mcg   1,000 mcg Intramuscular Q30 days Sindy Guadeloupe, MD    Review of Systems  Constitutional:  Negative for appetite change, chills, fatigue and fever.  Respiratory:  Negative for chest tightness and shortness of breath.   Cardiovascular:  Negative for chest pain and palpitations.  Gastrointestinal:  Negative for abdominal pain, nausea and vomiting.  Neurological:  Positive for weakness and numbness (R arm 2/2 problems with spine). Negative for dizziness.       Objective    BP 115/83 (BP Location: Left Arm, Patient Position: Sitting, Cuff Size: Normal)   Pulse 90   Resp 16   Wt 143 lb 4.8 oz (65 kg)   SpO2 99%   BMI 27.08 kg/m    Physical Exam Vitals reviewed.  Constitutional:      General: She is not in acute distress.    Appearance: Normal appearance. She is well-developed. She is not diaphoretic.  HENT:     Head: Normocephalic and atraumatic.  Eyes:     General: No scleral icterus.    Extraocular Movements: Extraocular movements intact.     Conjunctiva/sclera: Conjunctivae normal.     Pupils: Pupils are equal, round, and reactive to light.  Neck:     Thyroid: No thyromegaly.  Cardiovascular:     Rate and Rhythm: Normal rate and regular rhythm.     Pulses: Normal pulses.     Heart sounds: Normal heart sounds. No murmur heard. Pulmonary:     Effort: Pulmonary effort is normal. No respiratory distress.     Breath sounds: Normal breath sounds. No wheezing, rhonchi or rales.  Abdominal:     General: Abdomen is flat. Bowel sounds are normal.     Palpations: Abdomen is soft.  Musculoskeletal:        General: Swelling (lateral aspect of right knee, lateral aspect of the right hand) and tenderness (lateral aspect of the right hand, right thigh, right knee) present.     Cervical back: Neck supple.     Right lower leg: No edema.     Left lower leg: No edema.  Lymphadenopathy:     Cervical: No cervical adenopathy.  Skin:    General: Skin is warm and dry.     Findings: No  rash.  Neurological:     Mental Status: She is alert and oriented to person, place, and time. Mental status is at baseline.     Motor: Weakness (right wrist) present.     Gait: Gait abnormal (antalgic).  Psychiatric:        Behavior: Behavior normal.        Thought Content: Thought content normal.        Judgment: Judgment normal.       No results found for any visits on 01/28/22.  Assessment & Plan     1. Right arm, right leg numbness/tingling Could be due to chronic spine problems Noticeable  weakness in the right wrist/lateral aspect Continue gabapentin Advised to proceed to [facility] for evaluation and management  2. Acute pain of right knee, acute Swelling on the lateral aspects of the right knee Tenderness of the right knee and right thigh Advised to proceed to [facility] for possible imaging and evaluation Hx of trauma 2 weeks ago  3. Right leg pain Tenderness of the right knee and right thigh Advised to proceed to [facility] for evaluation  4. Right wrist pain, acute 2/2 recent household chores RICE recommended. Advised to proceed to [facility]     The patient was advised to call back or seek an in-person evaluation if the symptoms worsen or if the condition fails to improve as anticipated.  I discussed the assessment and treatment plan with the patient. The patient was provided an opportunity to ask questions and all were answered. The patient agreed with the plan and demonstrated an understanding of the instructions.  The entirety of the information documented in the History of Present Illness, Review of Systems and Physical Exam were personally obtained by me. Portions of this information were initially documented by the CMA and reviewed by me for thoroughness and accuracy.  Portions of this note were created using dictation software and may contain typographical errors.     Mardene Speak, PA-C  [facility] [phone]  (phone) [phone] (fax)  [place]

## 2022-02-04 ENCOUNTER — Ambulatory Visit
Admission: RE | Admit: 2022-02-04 | Discharge: 2022-02-04 | Disposition: A | Discharge status: Home / Self Care | Payer: Medicare HMO | Source: Ambulatory Visit | Attending: Urology | Admitting: Urology

## 2022-02-04 DIAGNOSIS — N2889 Other specified disorders of kidney and ureter: Secondary | ICD-10-CM | POA: Diagnosis not present

## 2022-02-04 DIAGNOSIS — N281 Cyst of kidney, acquired: Secondary | ICD-10-CM | POA: Diagnosis not present

## 2022-02-04 MED ORDER — GADOBUTROL 1 MMOL/ML IV SOLN
6.0000 mL | Freq: Once | INTRAVENOUS | Status: AC | PRN
  Administered 2022-02-04: 6 mL via INTRAVENOUS

## 2022-02-07 ENCOUNTER — Encounter
Admission: RE | Disposition: A | Discharge status: Home / Self Care | Payer: Self-pay | Source: Home / Self Care | Attending: Ophthalmology

## 2022-02-07 ENCOUNTER — Encounter: Payer: Self-pay | Admitting: Ophthalmology

## 2022-02-07 ENCOUNTER — Other Ambulatory Visit: Payer: Self-pay

## 2022-02-07 ENCOUNTER — Telehealth: Payer: Self-pay | Admitting: Urology

## 2022-02-07 ENCOUNTER — Ambulatory Visit
Admission: RE | Admit: 2022-02-07 | Discharge: 2022-02-07 | Disposition: A | Discharge status: Home / Self Care | Payer: Medicare HMO | Attending: Ophthalmology | Admitting: Ophthalmology

## 2022-02-07 ENCOUNTER — Ambulatory Visit: Payer: Medicare HMO | Admitting: Surgery

## 2022-02-07 ENCOUNTER — Ambulatory Visit: Payer: Medicare HMO | Admitting: Anesthesiology

## 2022-02-07 DIAGNOSIS — H2512 Age-related nuclear cataract, left eye: Secondary | ICD-10-CM | POA: Diagnosis not present

## 2022-02-07 DIAGNOSIS — Z8521 Personal history of malignant neoplasm of larynx: Secondary | ICD-10-CM | POA: Diagnosis not present

## 2022-02-07 DIAGNOSIS — Z9221 Personal history of antineoplastic chemotherapy: Secondary | ICD-10-CM | POA: Insufficient documentation

## 2022-02-07 DIAGNOSIS — K449 Diaphragmatic hernia without obstruction or gangrene: Secondary | ICD-10-CM | POA: Diagnosis not present

## 2022-02-07 DIAGNOSIS — Z923 Personal history of irradiation: Secondary | ICD-10-CM | POA: Diagnosis not present

## 2022-02-07 DIAGNOSIS — Z853 Personal history of malignant neoplasm of breast: Secondary | ICD-10-CM | POA: Diagnosis not present

## 2022-02-07 DIAGNOSIS — Z85828 Personal history of other malignant neoplasm of skin: Secondary | ICD-10-CM | POA: Insufficient documentation

## 2022-02-07 DIAGNOSIS — K219 Gastro-esophageal reflux disease without esophagitis: Secondary | ICD-10-CM | POA: Insufficient documentation

## 2022-02-07 DIAGNOSIS — I2510 Atherosclerotic heart disease of native coronary artery without angina pectoris: Secondary | ICD-10-CM | POA: Insufficient documentation

## 2022-02-07 DIAGNOSIS — H2511 Age-related nuclear cataract, right eye: Secondary | ICD-10-CM | POA: Diagnosis not present

## 2022-02-07 DIAGNOSIS — F418 Other specified anxiety disorders: Secondary | ICD-10-CM | POA: Diagnosis not present

## 2022-02-07 DIAGNOSIS — Z87891 Personal history of nicotine dependence: Secondary | ICD-10-CM | POA: Diagnosis not present

## 2022-02-07 DIAGNOSIS — Z9981 Dependence on supplemental oxygen: Secondary | ICD-10-CM | POA: Insufficient documentation

## 2022-02-07 DIAGNOSIS — J449 Chronic obstructive pulmonary disease, unspecified: Secondary | ICD-10-CM | POA: Insufficient documentation

## 2022-02-07 HISTORY — DX: Presence of dental prosthetic device (complete) (partial): Z97.2

## 2022-02-07 HISTORY — PX: CATARACT EXTRACTION W/PHACO: SHX586

## 2022-02-07 SURGERY — PHACOEMULSIFICATION, CATARACT, WITH IOL INSERTION
Anesthesia: Monitor Anesthesia Care | Site: Eye | Laterality: Right

## 2022-02-07 MED ORDER — ARMC OPHTHALMIC DILATING DROPS
1.0000 | OPHTHALMIC | Status: DC | PRN
  Administered 2022-02-07 (×3): 1 via OPHTHALMIC

## 2022-02-07 MED ORDER — MOXIFLOXACIN HCL 0.5 % OP SOLN
OPHTHALMIC | Status: DC | PRN
  Administered 2022-02-07: 0.2 mL via OPHTHALMIC

## 2022-02-07 MED ORDER — SIGHTPATH DOSE#1 BSS IO SOLN
INTRAOCULAR | Status: DC | PRN
  Administered 2022-02-07: 15 mL

## 2022-02-07 MED ORDER — MIDAZOLAM HCL 2 MG/2ML IJ SOLN
INTRAMUSCULAR | Status: DC | PRN
  Administered 2022-02-07 (×2): 1 mg via INTRAVENOUS

## 2022-02-07 MED ORDER — LIDOCAINE HCL (PF) 2 % IJ SOLN
INTRAOCULAR | Status: DC | PRN
  Administered 2022-02-07: 1 mL via INTRAOCULAR

## 2022-02-07 MED ORDER — SIGHTPATH DOSE#1 NA HYALUR & NA CHOND-NA HYALUR IO KIT
PACK | INTRAOCULAR | Status: DC | PRN
  Administered 2022-02-07: 1 via OPHTHALMIC

## 2022-02-07 MED ORDER — FENTANYL CITRATE (PF) 100 MCG/2ML IJ SOLN
INTRAMUSCULAR | Status: DC | PRN
  Administered 2022-02-07 (×2): 50 ug via INTRAVENOUS

## 2022-02-07 MED ORDER — SIGHTPATH DOSE#1 BSS IO SOLN
INTRAOCULAR | Status: DC | PRN
  Administered 2022-02-07: 94 mL via OPHTHALMIC

## 2022-02-07 MED ORDER — TETRACAINE HCL 0.5 % OP SOLN
1.0000 [drp] | OPHTHALMIC | Status: DC | PRN
  Administered 2022-02-07 (×3): 1 [drp] via OPHTHALMIC

## 2022-02-07 SURGICAL SUPPLY — 13 items
CATARACT SUITE SIGHTPATH (MISCELLANEOUS) ×1 IMPLANT
DISSECTOR HYDRO NUCLEUS 50X22 (MISCELLANEOUS) ×1 IMPLANT
FEE CATARACT SUITE SIGHTPATH (MISCELLANEOUS) ×1 IMPLANT
GLOVE SURG GAMMEX PI TX LF 7.5 (GLOVE) ×1 IMPLANT
GLOVE SURG SYN 8.5  E (GLOVE) ×1
GLOVE SURG SYN 8.5 E (GLOVE) ×1 IMPLANT
GLOVE SURG SYN 8.5 PF PI (GLOVE) ×1 IMPLANT
LENS IOL TECNIS EYHANCE 20.0 (Intraocular Lens) IMPLANT
NDL FILTER BLUNT 18X1 1/2 (NEEDLE) ×1 IMPLANT
NEEDLE FILTER BLUNT 18X1 1/2 (NEEDLE) ×1 IMPLANT
SYR 3ML LL SCALE MARK (SYRINGE) ×1 IMPLANT
SYR 5ML LL (SYRINGE) ×1 IMPLANT
WATER STERILE IRR 250ML POUR (IV SOLUTION) ×1 IMPLANT

## 2022-02-07 NOTE — Telephone Encounter (Signed)
Malachy Mood with [facility] [phone]) called the office today with a call report for MRI results for this patient.

## 2022-02-07 NOTE — H&P (Signed)
Denise Watts   Primary Care Physician:  [name], FNP Ophthalmologist: Dr. Benay Pillow  Pre-Procedure History & Physical: HPI:  Denise Watts is a 66 y.o. female here for cataract surgery.   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   Cough 03/02/2017   INTERMTTENT DRY COUGH   Depression    GERD (gastroesophageal reflux disease)    History of hiatal hernia    Personal history of chemotherapy    Personal history of radiation therapy    Skin cancer    Throat cancer ([place]) 1998   radiation   Wears dentures    full upper and lower    Past Surgical History:  Procedure Laterality Date   BREAST BIOPSY Right 1997   positive   BREAST BIOPSY Right 11/15/2017   INVASIVE MAMMARY CARCINOMA triple negative   BREAST BIOPSY Right 07/20/2020   [place] bx, Q marker, neg   BREAST LUMPECTOMY Right 1997   2019 also   BREAST LUMPECTOMY WITH SENTINEL LYMPH NODE BIOPSY Right 12/08/2017   Procedure: BREAST LUMPECTOMY WITH SENTINEL LYMPH NODE BX;  Surgeon: Robert Bellow, MD;  Location: [facility];  Service: General;  Laterality: Right;   BREAST REDUCTION WITH MASTOPEXY Left 06/10/2019   Procedure: Left breast mastopexy reduction for symmetry;  Surgeon: Wallace Going, DO;  Location: [place];  Service: Plastics;  Laterality: Left;  total 2.5 hours   COLONOSCOPY  2015   COLONOSCOPY WITH PROPOFOL N/A 10/29/2019   Procedure: COLONOSCOPY WITH PROPOFOL;  Surgeon: Lucilla Lame, MD;  Location: [facility];  Service: Endoscopy;  Laterality: N/A;   ESOPHAGOGASTRODUODENOSCOPY (EGD) WITH PROPOFOL N/A 05/24/2019   Procedure: ESOPHAGOGASTRODUODENOSCOPY (EGD) WITH PROPOFOL;  Surgeon: Lucilla Lame, MD;  Location: [facility];  Service: Endoscopy;   Laterality: N/A;   ESOPHAGUS SURGERY     KNEE SURGERY Right    LIPOSUCTION WITH LIPOFILLING Right 06/10/2019   Procedure: right breast scar and capsule release with fat grafting;  Surgeon: Wallace Going, DO;  [place];  Service: Plastics;  Laterality: Right;   PORT-A-CATH REMOVAL     PORTACATH PLACEMENT Right 01/17/2018   Procedure: INSERTION PORT-A-CATH- RIGHT;  Surgeon: Robert Bellow, MD;  Location: [place] [facility];  Service: General;  Laterality: Right;   ROBOTIC ADRENALECTOMY Right 12/28/2016   Procedure: ROBOTIC ADRENALECTOMY;  Surgeon: Hollice Espy, MD;  Location: [facility];  Service: Urology;  Laterality: Right;   SKIN CANCER EXCISION     THROAT SURGERY  1998   throat cancer    TUBAL LIGATION     VENTRAL HERNIA REPAIR N/A 03/03/2017   Procedure: HERNIA REPAIR VENTRAL ADULT;  Surgeon: Clayburn Pert, MD;  Location: [facility];  Service: General;  Laterality: N/A;    Prior to Admission medications   Medication Sig Start Date End Date Taking? Authorizing Provider  albuterol (VENTOLIN HFA) 108 (90 Base) MCG/ACT inhaler Inhale 2 puffs into the lungs every 6 (six) hours as needed for shortness of breath. 01/29/21  Yes Menshew, Dannielle Karvonen, PA-C  alum & mag hydroxide-simeth (MAALOX PLUS) 400-400-40 MG/5ML suspension Take 15 mLs by mouth every 6 (six) hours as needed for indigestion. 11/17/21  Yes [name], FNP  aspirin EC 81 MG  tablet Take 1 tablet (81 mg total) by mouth daily. Swallow whole. 12/08/21  Yes Furth, Cadence H, PA-C  benzonatate (TESSALON) 100 MG capsule Take 2 capsules (200 mg total) by mouth 3 (three) times daily. 12/15/21  Yes [name], FNP  brompheniramine-pseudoephedrine-DM 30-2-10 MG/5ML syrup Take 5 mLs by mouth 4 (four) times daily as needed. 01/29/21  Yes Menshew, Dannielle Karvonen, PA-C  Budeson-Glycopyrrol-Formoterol (BREZTRI AEROSPHERE) 160-9-4.8 MCG/ACT AERO Inhale 2 puffs into the lungs in the morning and at bedtime. 12/15/21   Yes [name], FNP  buPROPion (WELLBUTRIN) 75 MG tablet Take 75 mg by mouth 2 (two) times daily. 05/28/21  Yes [provider]  busPIRone (BUSPAR) 5 MG tablet Take 1 tablet (5 mg total) by mouth 2 (two) times daily. 05/26/21  Yes [name], FNP  cyclobenzaprine (FLEXERIL) 5 MG tablet TAKE 1 TABLET BY MOUTH AT BEDTIME 01/26/22  Yes [name], FNP  DULoxetine (CYMBALTA) 60 MG capsule Take 1 capsule (60 mg total) by mouth daily. 05/26/21 05/26/22 Yes [name], FNP  fluconazole (DIFLUCAN) 150 MG tablet Take one by mouth for vaginal yeast; repeat in 4 days if symptoms remain 12/15/21  Yes [name], FNP  fluticasone [facility]) 50 MCG/ACT nasal spray Place 1 spray into both nostrils at bedtime. 06/19/20  Yes Mar Daring, PA-C  gabapentin (NEURONTIN) 300 MG capsule TAKE 1 CAPSULE BY MOUTH 3 TIMES DAILY 12/21/21  Yes Tally Joe T, FNP  guaiFENesin-codeine 100-10 MG/5ML syrup Take 10 mLs by mouth at bedtime and may repeat dose one time if needed. 12/02/21  Yes [name], FNP  guaiFENesin-dextromethorphan (ROBITUSSIN DM) 100-10 MG/5ML syrup Take 5 mLs by mouth every 4 (four) hours as needed for cough. 12/02/21  Yes [name], FNP  montelukast (SINGULAIR) 10 MG tablet Take 1 tablet (10 mg total) by mouth at bedtime. 05/26/21  Yes [name], FNP  nitrofurantoin, macrocrystal-monohydrate, (MACROBID) 100 MG capsule Take 1 capsule (100 mg total) by mouth every 12 (twelve) hours. 01/25/22  Yes McGowan, Larene Beach A, PA-C  omeprazole (PRILOSEC) 40 MG capsule Take 40 mg by mouth daily.   Yes [provider]  oxybutynin (DITROPAN XL) 15 MG 24 hr tablet Take 1 tablet (15 mg total) by mouth daily. 12/09/20  Yes Hollice Espy, MD  rOPINIRole (REQUIP) 0.25 MG tablet Take 1 tablet (0.25 mg total) by mouth at bedtime. 11/03/21  Yes [name], FNP  rosuvastatin (CRESTOR) 40 MG tablet Take 1 tablet (40 mg total) by mouth daily. 12/15/21  Yes [name], FNP  sucralfate  (CARAFATE) 1 g tablet Take 1 tablet (1 g total) by mouth 4 (four) times daily -  with meals and at bedtime. 12/15/21  Yes [name], FNP    Allergies as of 12/15/2021 - Review Complete 12/15/2021  Allergen Reaction Noted   Sulfa antibiotics Other (See Comments) 10/28/2020    Family History  Problem Relation Age of Onset   Diabetes Mother    Heart attack Mother    Cancer Sister        cancer of eyes, spread to lung, other places. died at 37   Cancer Sister 27       unknown primary, spread to bone, brain died in 77's   Cancer Paternal Aunt        unk type   Cancer Paternal Grandmother        type unk   Prostate cancer Neg Hx  Kidney cancer Neg Hx    Bladder Cancer Neg Hx     Social History   Socioeconomic History   Marital status: Married    Spouse name: Not on file   Number of children: 4   Years of education: Not on file   Highest education level: 10th grade  Occupational History   Occupation: retired  Tobacco Use   Smoking status: Former    Packs/day: 1.50    Years: 30.00    Total pack years: 45.00    Types: Cigarettes    Quit date: 05/03/1995    Years since quitting: 26.7   Smokeless tobacco: Never  Vaping Use   Vaping Use: Never used  Substance and Sexual Activity   Alcohol use: Not Currently    Alcohol/week: 4.0 standard drinks of alcohol    Types: 4 Shots of liquor per week    Comment: none   Drug use: No   Sexual activity: Not Currently    Birth control/protection: Post-menopausal  Other Topics Concern   Not on file  Social History Narrative   Not on file   Social Determinants of Health   Financial Resource Strain: Low Risk  (12/02/2021)   Overall Financial Resource Strain (CARDIA)    Difficulty of Paying Living Expenses: Not very hard  Food Insecurity: No Food Insecurity (12/02/2021)   Hunger Vital Sign    Worried About Running Out of Food in the Last Year: Never true    Ran Out of Food in the Last Year: Never true  Transportation Needs: No  Transportation Needs (12/02/2021)   PRAPARE - Hydrologist (Medical): No    Lack of Transportation (Non-Medical): No  Physical Activity: Insufficiently Active (12/02/2021)   Exercise Vital Sign    Days of Exercise per Week: 3 days    Minutes of Exercise per Session: 30 min  Stress: Stress Concern Present (12/02/2021)   [place]    Feeling of Stress : To some extent  Social Connections: Moderately Isolated (12/02/2021)   Social Connection and Isolation Panel [NHANES]    Frequency of Communication with Friends and Family: Three times a week    Frequency of Social Gatherings with Friends and Family: Twice a week    Attends Religious Services: Never    Marine scientist or Organizations: No    Attends Archivist Meetings: Never    Marital Status: Married  Human resources officer Violence: Not At Risk (12/02/2021)   Humiliation, Afraid, Rape, and Kick questionnaire    Fear of Current or Ex-Partner: No    Emotionally Abused: No    Physically Abused: No    Sexually Abused: No    Review of Systems: See HPI, otherwise negative ROS  Physical Exam: BP (!) 144/85   Pulse 89   Temp 97.8 F (36.6 C) (Temporal)   Resp 18   Ht '5\' 1"'$  (1.549 m)   Wt 65 kg   SpO2 97%   BMI 27.08 kg/m  General:   Alert, cooperative in NAD Head:  Normocephalic and atraumatic. Respiratory:  Normal work of breathing. Cardiovascular:  RRR  Impression/Plan: [name] is here for cataract surgery.  Risks, benefits, limitations, and alternatives regarding cataract surgery have been reviewed with the patient.  Questions have been answered.  All parties agreeable.   Benay Pillow, MD  02/07/2022, 9:44 AM

## 2022-02-07 NOTE — Transfer of Care (Signed)
Immediate Anesthesia Transfer of Care Note  Patient: Denise [facility]  Procedure(s) Performed: CATARACT EXTRACTION PHACO AND INTRAOCULAR LENS PLACEMENT (IOC) RIGHT DIABETIC (Right: Eye)  Patient Location: PACU  Anesthesia Type: MAC  Level of Consciousness: awake, alert  and patient cooperative  Airway and Oxygen Therapy: Patient Spontanous Breathing and Patient connected to supplemental oxygen  Post-op Assessment: Post-op Vital signs reviewed, Patient's Cardiovascular Status Stable, Respiratory Function Stable, Patent Airway and No signs of Nausea or vomiting  Post-op Vital Signs: Reviewed and stable  Complications: No notable events documented.

## 2022-02-07 NOTE — Op Note (Signed)
OPERATIVE NOTE  Denise Watts [id number] 02/07/2022   PREOPERATIVE DIAGNOSIS:  Nuclear sclerotic cataract right eye.  H25.11   POSTOPERATIVE DIAGNOSIS:    Nuclear sclerotic cataract right eye.     PROCEDURE:  Phacoemusification with posterior chamber intraocular lens placement of the right eye   LENS:   Implant Name Type Inv. Item Serial No. Manufacturer Lot No. LRB No. Used Action  LENS IOL TECNIS EYHANCE 20.0 - [id number] Intraocular Lens LENS IOL TECNIS EYHANCE 20.0 [id number] [employer]  Right 1 Implanted       Procedure(s) with comments: CATARACT EXTRACTION PHACO AND INTRAOCULAR LENS PLACEMENT (IOC) RIGHT DIABETIC (Right) - 3.60 00:34.7  DIB00 +20.0   ULTRASOUND TIME: 0 minutes 34 seconds.  CDE 3.60   SURGEON:  Benay Pillow, MD, MPH  ANESTHESIOLOGIST: Anesthesiologist: Ilene Qua, MD CRNA: Moises Blood, CRNA   ANESTHESIA:  Topical with tetracaine drops augmented with 1% preservative-free intracameral lidocaine.  ESTIMATED BLOOD LOSS: less than 1 mL.   COMPLICATIONS:  None.   DESCRIPTION OF PROCEDURE:  The patient was identified in the holding room and transported to the operating room and placed in the supine position under the operating microscope.  The right eye was identified as the operative eye and it was prepped and draped in the usual sterile ophthalmic fashion.   A 1.0 millimeter clear-corneal paracentesis was made at the 10:30 position. 0.5 ml of preservative-free 1% lidocaine with epinephrine was injected into the anterior chamber.  The anterior chamber was filled with viscoelastic.  A 2.4 millimeter keratome was used to make a near-clear corneal incision at the 8:00 position.  A curvilinear capsulorrhexis was made with a cystotome and capsulorrhexis forceps.  Balanced salt solution was used to hydrodissect and hydrodelineate the nucleus.   Phacoemulsification was then used in stop and chop fashion to remove the lens nucleus and epinucleus.  The remaining  cortex was then removed using the irrigation and aspiration handpiece. Viscoelastic was then placed into the capsular bag to distend it for lens placement.  A lens was then injected into the capsular bag.  The remaining viscoelastic was aspirated.   Wounds were hydrated with balanced salt solution.  The anterior chamber was inflated to a physiologic pressure with balanced salt solution.   Intracameral vigamox 0.1 mL undiluted was injected into the eye and a drop placed onto the ocular surface.  No wound leaks were noted.  The patient was taken to the recovery room in stable condition without complications of anesthesia or surgery  Benay Pillow 02/07/2022, 10:11 AM

## 2022-02-07 NOTE — Anesthesia Postprocedure Evaluation (Signed)
Anesthesia Post Note  Patient: Denise [facility]  Procedure(s) Performed: CATARACT EXTRACTION PHACO AND INTRAOCULAR LENS PLACEMENT (IOC) RIGHT DIABETIC (Right: Eye)  Patient location during evaluation: PACU Anesthesia Type: MAC Level of consciousness: awake and alert Pain management: pain level controlled Vital Signs Assessment: post-procedure vital signs reviewed and stable Respiratory status: spontaneous breathing, nonlabored ventilation, respiratory function stable and patient connected to nasal cannula oxygen Cardiovascular status: stable and blood pressure returned to baseline Postop Assessment: no apparent nausea or vomiting Anesthetic complications: no   No notable events documented.   Last Vitals:  Vitals:   02/07/22 0901 02/07/22 1014  BP: (!) 144/85 (!) 132/57  Pulse: 89 95  Resp: 18 13  Temp: 36.6 C (!) 36.1 C  SpO2: 97% 94%    Last Pain:  Vitals:   02/07/22 1014  TempSrc:   PainSc: 0-No pain                 Ilene Qua

## 2022-02-07 NOTE — Progress Notes (Unsigned)
02/08/2022 3:21 PM   Denise Watts October 09, 1955 [id number]  Referring provider: [name], [address] [place],  [place] [id number]  Urological history: 1. OAB wet -contributing factors of age, smoking, vaginal atrophy, COPD, anxiety, depression, parkinsonism, pre-diabetic and muscle relaxer's -cysto 2022- NED -PVR 0 mL -failed Gemtesa 75 mg daily -Managed with oxybutynin XL 15 mg daily   2. Stress incontinence -contributing factors of age and vaginal atrophy   3. Right adrenal mass -s/p right adrenalectomy 2018 - pathology positive for high grade carcinoma with extensive necrosis - surgical margins negative  -followed by the Cancer center  Chief Complaint  Patient presents with   Results    HPI: Denise Watts is a 66 y.o. female who presents today for MRI report.   At her visit on 01/25/2022, a CT scan with contrast (11/2021) ordered by her general surgeon noted Indeterminate 1.5 cm (from 1.3 cm) left renal lesion. Finding could represent complex cyst versus solid mass. When the patient is clinically stable and able to follow directions and hold their breath (preferably as an outpatient) further evaluation with dedicated abdominal MRI renal protocol should be considered.   She was also having a several week history of clear vaginal discharge, dysuria and suprapubic discomfort.  She states the reason why she has put off seeking treatment for this since she has been taking care of her chronically ill husband.  CATH UA nitrite positive, 6-10 WBC's and many bacteria.  Urine culture was positive for Klebsiella pneumoniae.  She was placed on 7 days of culture appropriate antibiotic, Macrobid.  MRI (02/04/2022) Lesion of concern on recent CT abdomen/pelvis represents a 1.5 cm benign Bosniak II cyst upper pole left kidney. Additional tiny simple cysts in the left kidney. No followup recommended.  And a, 11 mm posterior left upper lobe nodule. No nodule is seen in this location  on CT chest of 11/25/2021. Repeat CT chest without contrast recommended to further evaluate.  Today, she does not feel like the UTI has completely abated.  She is still having suprapubic pain and is having urge incontinence.  Patient denies any modifying or aggravating factors.  Patient denies any gross hematuria, dysuria or flank pain.  Patient denies any fevers, chills, nausea or vomiting.    UA moderate bacteria.   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   Cough 03/02/2017   INTERMTTENT DRY COUGH   Depression    GERD (gastroesophageal reflux disease)    History of hiatal hernia    Personal history of chemotherapy    Personal history of radiation therapy    Skin cancer    Throat cancer ([place]) 1998   radiation   Wears dentures    full upper and lower    Surgical History: Past Surgical History:  Procedure Laterality Date   BREAST BIOPSY Right 1997   positive   BREAST BIOPSY Right 11/15/2017   INVASIVE MAMMARY CARCINOMA triple negative   BREAST BIOPSY Right 07/20/2020   [place] bx, Q marker, neg   BREAST LUMPECTOMY Right 1997   2019 also   BREAST LUMPECTOMY WITH SENTINEL LYMPH NODE BIOPSY Right 12/08/2017   Procedure: BREAST LUMPECTOMY WITH SENTINEL LYMPH NODE  BX;  Surgeon: Robert Bellow, MD;  Location: [facility];  Service: General;  Laterality: Right;   BREAST REDUCTION WITH MASTOPEXY Left 06/10/2019   Procedure: Left breast mastopexy reduction for symmetry;  Surgeon: Wallace Going, DO;  Location: [facility];  Service: Plastics;  Laterality: Left;  total 2.5 hours   CATARACT EXTRACTION W/PHACO Right 02/07/2022   Procedure: CATARACT EXTRACTION PHACO AND INTRAOCULAR LENS PLACEMENT (IOC) RIGHT DIABETIC;  Surgeon:  Eulogio Bear, MD;  Location: [facility];  Service: Ophthalmology;  Laterality: Right;  3.60 00:34.7   COLONOSCOPY  2015   COLONOSCOPY WITH PROPOFOL N/A 10/29/2019   Procedure: COLONOSCOPY WITH PROPOFOL;  Surgeon: Lucilla Lame, MD;  Location: [facility] ENDOSCOPY;  Service: Endoscopy;  Laterality: N/A;   ESOPHAGOGASTRODUODENOSCOPY (EGD) WITH PROPOFOL N/A 05/24/2019   Procedure: ESOPHAGOGASTRODUODENOSCOPY (EGD) WITH PROPOFOL;  Surgeon: Lucilla Lame, MD;  Location: [facility] ENDOSCOPY;  Service: Endoscopy;  Laterality: N/A;   ESOPHAGUS SURGERY     KNEE SURGERY Right    LIPOSUCTION WITH LIPOFILLING Right 06/10/2019   Procedure: right breast scar and capsule release with fat grafting;  Surgeon: Wallace Going, DO;  Location: [facility];  Service: Plastics;  Laterality: Right;   PORT-A-CATH REMOVAL     PORTACATH PLACEMENT Right 01/17/2018   Procedure: INSERTION PORT-A-CATH- RIGHT;  Surgeon: Robert Bellow, MD;  Location: [facility];  Service: General;  Laterality: Right;   ROBOTIC ADRENALECTOMY Right 12/28/2016   Procedure: ROBOTIC ADRENALECTOMY;  Surgeon: Hollice Espy, MD;  Location: [facility];  Service: Urology;  Laterality: Right;   SKIN CANCER EXCISION     THROAT SURGERY  1998   throat cancer    TUBAL LIGATION     VENTRAL HERNIA REPAIR N/A 03/03/2017   Procedure: HERNIA REPAIR VENTRAL ADULT;  Surgeon: Clayburn Pert, MD;  Location: [facility];  Service: General;  Laterality: N/A;    Home Medications:  Allergies as of 02/08/2022       Reactions   Sulfa Antibiotics Other (See Comments)   Mouth sore and raw        Medication List        Accurate as of February 08, 2022  3:21 PM. If you have any questions, ask your nurse or doctor.          STOP taking these medications    oxybutynin 15 MG 24 hr tablet Commonly known as: DITROPAN XL Stopped by: Amandalynn Pitz, PA-C       TAKE these medications    albuterol 108 (90 Base) MCG/ACT  inhaler Commonly known as: VENTOLIN HFA Inhale 2 puffs into the lungs every 6 (six) hours as needed for shortness of breath.   alum & mag hydroxide-simeth 989-211-94 MG/5ML suspension Commonly known as: MAALOX PLUS Take 15 mLs by mouth every 6 (six) hours as needed for indigestion.   aspirin EC 81 MG tablet Take 1 tablet (81 mg total) by mouth daily. Swallow whole.   benzonatate 100 MG capsule Commonly known as: TESSALON Take 2 capsules (200 mg total) by mouth 3 (three) times daily.   Breztri Aerosphere 160-9-4.8 MCG/ACT Aero Generic drug: Budeson-Glycopyrrol-Formoterol Inhale 2 puffs into the lungs in the morning and at bedtime.   brompheniramine-pseudoephedrine-DM 30-2-10 MG/5ML syrup Take 5 mLs by mouth 4 (four) times daily as needed.   buPROPion 75 MG tablet Commonly known as: WELLBUTRIN Take 75 mg by mouth 2 (two) times daily.   busPIRone 5 MG tablet Commonly known as: BUSPAR Take 1 tablet (5 mg total)  by mouth 2 (two) times daily.   cyclobenzaprine 5 MG tablet Commonly known as: FLEXERIL TAKE 1 TABLET BY MOUTH AT BEDTIME   DULoxetine 60 MG capsule Commonly known as: CYMBALTA Take 1 capsule (60 mg total) by mouth daily.   fluconazole 150 MG tablet Commonly known as: DIFLUCAN Take one by mouth for vaginal yeast; repeat in 4 days if symptoms remain   fluticasone 50 MCG/ACT nasal spray Commonly known as: FLONASE Place 1 spray into both nostrils at bedtime.   gabapentin 300 MG capsule Commonly known as: NEURONTIN TAKE 1 CAPSULE BY MOUTH 3 TIMES DAILY   guaiFENesin-codeine 100-10 MG/5ML syrup Take 10 mLs by mouth at bedtime and may repeat dose one time if needed.   guaiFENesin-dextromethorphan 100-10 MG/5ML syrup Commonly known as: ROBITUSSIN DM Take 5 mLs by mouth every 4 (four) hours as needed for cough.   mirabegron ER 25 MG Tb24 tablet Commonly known as: MYRBETRIQ Take 1 tablet (25 mg total) by mouth daily. Started by: Zara Council, PA-C    montelukast 10 MG tablet Commonly known as: SINGULAIR Take 1 tablet (10 mg total) by mouth at bedtime.   nitrofurantoin (macrocrystal-monohydrate) 100 MG capsule Commonly known as: MACROBID Take 1 capsule (100 mg total) by mouth every 12 (twelve) hours.   omeprazole 40 MG capsule Commonly known as: PRILOSEC Take 40 mg by mouth daily.   rOPINIRole 0.25 MG tablet Commonly known as: REQUIP Take 1 tablet (0.25 mg total) by mouth at bedtime.   rosuvastatin 40 MG tablet Commonly known as: CRESTOR Take 1 tablet (40 mg total) by mouth daily.   sucralfate 1 g tablet Commonly known as: CARAFATE Take 1 tablet (1 g total) by mouth 4 (four) times daily -  with meals and at bedtime.        Allergies:  Allergies  Allergen Reactions   Sulfa Antibiotics Other (See Comments)    Mouth sore and raw    Family History: Family History  Problem Relation Age of Onset   Diabetes Mother    Heart attack Mother    Cancer Sister        cancer of eyes, spread to lung, other places. died at 106   Cancer Sister 68       unknown primary, spread to bone, brain died in 70's   Cancer Paternal Aunt        unk type   Cancer Paternal Grandmother        type unk   Prostate cancer Neg Hx    Kidney cancer Neg Hx    Bladder Cancer Neg Hx     Social History:  reports that she quit smoking about 26 years ago. Her smoking use included cigarettes. She has a 45.00 pack-year smoking history. She has never used smokeless tobacco. She reports that she does not currently use alcohol after a past usage of about 4.0 standard drinks of alcohol per week. She reports that she does not use drugs.  ROS: Pertinent ROS in HPI  Physical Exam: BP 118/74   Pulse 94   Ht '5\' 1"'$  (1.549 m)   Wt 143 lb (64.9 kg)   BMI 27.02 kg/m   Constitutional:  Well nourished. Alert and oriented, No acute distress. HEENT: [place] AT, moist mucus membranes.  Trachea midline Cardiovascular: No clubbing, cyanosis, or edema. Respiratory:  Normal respiratory effort, no increased work of breathing. Neurologic: Grossly intact, no focal deficits, moving all 4 extremities. Psychiatric: Normal mood and affect.    Laboratory Data: N/A  Pertinent Imaging: Narrative & Impression  CLINICAL DATA:  Renal mass.   EXAM: MRI ABDOMEN WITHOUT AND WITH CONTRAST   TECHNIQUE: Multiplanar multisequence MR imaging of the abdomen was performed both before and after the administration of intravenous contrast.   CONTRAST:  47m GADAVIST GADOBUTROL 1 MMOL/ML IV SOLN   COMPARISON:  Abdomen CT 12/09/2021.   FINDINGS: Lower chest: Fatty lesion in the inferior right breast is stable compared to prior CT.11 mm nodule identified posterior left upper lobe, visible only on T2 coronal haste image 6 of series 3. No pulmonary nodule or rib lesion seen at this location on CT chest of 09/01/2021.   Hepatobiliary: No suspicious focal abnormality within the liver parenchyma. There is no evidence for gallstones, gallbladder wall thickening, or pericholecystic fluid. No intrahepatic or extrahepatic biliary dilation.   Pancreas: No focal mass lesion. No dilatation of the main duct. No intraparenchymal cyst. No peripancreatic edema.   Spleen:  No splenomegaly. No focal mass lesion.   Adrenals/Urinary Tract: Right kidney unremarkable. 1.5 cm exophytic T1 intermediate intensity, T2 hyperintense lesion identified upper pole left kidney shows no enhancement after IV contrast administration compatible with a benign Bosniak II complex cyst. Additional tiny simple cysts are seen in the left kidney. No hydronephrosis on either side.   Stomach/Bowel: Stomach is unremarkable. No gastric wall thickening. No evidence of outlet obstruction. Duodenum is normally positioned as is the ligament of Treitz. No small bowel or colonic dilatation within the visualized abdomen.   Vascular/Lymphatic: No abdominal aortic aneurysm. There is no gastrohepatic or  hepatoduodenal ligament lymphadenopathy. No retroperitoneal or mesenteric lymphadenopathy.   Other:  No intraperitoneal free fluid.   Musculoskeletal: No focal suspicious marrow enhancement within the visualized bony anatomy.   IMPRESSION: 1. Lesion of concern on recent CT abdomen/pelvis represents a 1.5 cm benign Bosniak II cyst upper pole left kidney. Additional tiny simple cysts in the left kidney. No followup recommended. 2. 11 mm posterior left upper lobe nodule. No nodule is seen in this location on CT chest of 11/25/2021. Repeat CT chest without contrast recommended to further evaluate.   These results will be called to the ordering clinician or representative by the Radiologist Assistant, and communication documented in the PACS or [employer]     Electronically Signed   By: EMisty StanleyM.D.   On: 02/07/2022 09:36    I have independently reviewed the films.     Assessment & Plan:    1.  Dysuria -resolved  2. Urge incontinence -UA moderate bacteria  -urine culture -given Myrbetriq 25 mg, # 28 samples for symptom control while culture result is pending  3.  Indeterminate left renal lesion -lesion was a Bosniak II and no further follow up in warranted  4. Pulmonary nodule -11 mm posterior left upper lobe nodule - recommendations are to repeat chest CT at this time.  -she has an upcoming appointment w/ Dr. VChesley Miresand she will discuss this further w/ him   Return for pending urine cutlure results .  These notes generated with voice recognition software. I apologize for typographical errors.  [place] [id number] [place] [phone]

## 2022-02-08 ENCOUNTER — Encounter: Payer: Self-pay | Admitting: Ophthalmology

## 2022-02-08 ENCOUNTER — Ambulatory Visit: Payer: Medicare HMO | Admitting: Urology

## 2022-02-08 VITALS — BP 118/74 | HR 94 | Ht 61.0 in | Wt 143.0 lb

## 2022-02-08 DIAGNOSIS — N289 Disorder of kidney and ureter, unspecified: Secondary | ICD-10-CM

## 2022-02-08 DIAGNOSIS — N3941 Urge incontinence: Secondary | ICD-10-CM

## 2022-02-08 DIAGNOSIS — R911 Solitary pulmonary nodule: Secondary | ICD-10-CM

## 2022-02-08 LAB — URINALYSIS, COMPLETE
Bilirubin, UA: NEGATIVE
Glucose, UA: NEGATIVE
Ketones, UA: NEGATIVE
Leukocytes,UA: NEGATIVE
Nitrite, UA: NEGATIVE
RBC, UA: NEGATIVE
Specific Gravity, UA: 1.03 (ref 1.005–1.030)
Urobilinogen, Ur: 0.2 mg/dL (ref 0.2–1.0)
pH, UA: 5 (ref 5.0–7.5)

## 2022-02-08 LAB — MICROSCOPIC EXAMINATION

## 2022-02-08 LAB — BLADDER SCAN AMB NON-IMAGING

## 2022-02-08 MED ORDER — MIRABEGRON ER 25 MG PO TB24: 25.0000 mg | ORAL_TABLET | Freq: Every day | ORAL | 0 refills | Status: DC

## 2022-02-10 DIAGNOSIS — M13841 Other specified arthritis, right hand: Secondary | ICD-10-CM | POA: Diagnosis not present

## 2022-02-10 DIAGNOSIS — M1711 Unilateral primary osteoarthritis, right knee: Secondary | ICD-10-CM | POA: Diagnosis not present

## 2022-02-10 LAB — CULTURE, URINE COMPREHENSIVE

## 2022-02-11 ENCOUNTER — Telehealth: Payer: Self-pay

## 2022-02-11 NOTE — Telephone Encounter (Signed)
LMOM notifying pt. Mychart msg sent.

## 2022-02-11 NOTE — Telephone Encounter (Signed)
-----   Message from Nori Riis, PA-C sent at 02/11/2022  6:50 AM EDT ----- Please let Mrs. Modisette know that her urine culture was negative for infection.  Are the samples of the Myrbetriq helping?

## 2022-02-22 ENCOUNTER — Other Ambulatory Visit: Payer: Self-pay | Admitting: Family Medicine

## 2022-02-24 ENCOUNTER — Institutional Professional Consult (permissible substitution): Payer: Medicare HMO | Admitting: Pulmonary Disease

## 2022-02-24 ENCOUNTER — Encounter: Payer: Self-pay | Admitting: Ophthalmology

## 2022-02-24 ENCOUNTER — Telehealth: Payer: Self-pay | Admitting: Pulmonary Disease

## 2022-02-24 NOTE — Telephone Encounter (Signed)
She had an appointment with me on 02/24/22, but rescheduled to December with Dr. Genia Harold.    Please contact her to see if she would be agreeable to schedule CT chest without contrast now to assess lung nodule rather than waiting until December appointment.

## 2022-02-24 NOTE — Telephone Encounter (Signed)
-----   Message from Nori Riis, PA-C sent at 02/12/2022  6:13 PM EDT ----- Regarding: Pulmonary nodule, New Patient Dr. Halford Chessman,  Denise Watts underwent a MRI of her kidneys with me recently and there was a finding of a 11 mm posterior left upper lobe nodule and the radiologist is recommending a follow up CT as they did not see a nodule in this location on CT chest of 11/25/2021.    I noticed she has an upcoming appointment with you on the 26th as a new patient for COPD.  She has had two prior CT's of the chest and this is out of my area of expertise.  I would like to get your thoughts on this before I expose her to more radiation and another expensive test.    Thank you for your time, Zara Council, PA-C   ----- Message ----- From: Interface, Rad Results In Sent: 02/07/2022   9:38 AM EDT To: Nori Riis, PA-C

## 2022-02-24 NOTE — Telephone Encounter (Signed)
ATC patient. Unable to leave vm due to mailbox being full. Looking at her chart, she recently seen Dr. Lanney Gins. Will need to verify if she wishes to switch to our office.

## 2022-02-25 NOTE — Telephone Encounter (Signed)
Called and spoke to patient. She stated that she would like to continue to follow with Dr. Lanney Gins. She will contact his office for an appointment. 04/04/2022 appt with Dr. Genia Harold has been canceled.   Routing to Dr. Halford Chessman to make aware.

## 2022-02-25 NOTE — Discharge Instructions (Signed)

## 2022-02-28 ENCOUNTER — Inpatient Hospital Stay: Payer: Medicare HMO | Admitting: Oncology

## 2022-02-28 ENCOUNTER — Other Ambulatory Visit: Payer: Self-pay

## 2022-02-28 ENCOUNTER — Inpatient Hospital Stay: Payer: Medicare HMO

## 2022-02-28 ENCOUNTER — Ambulatory Visit: Payer: Medicare HMO | Admitting: Anesthesiology

## 2022-02-28 ENCOUNTER — Encounter: Payer: Self-pay | Admitting: Ophthalmology

## 2022-02-28 ENCOUNTER — Encounter
Admission: RE | Disposition: A | Discharge status: Home / Self Care | Payer: Self-pay | Source: Home / Self Care | Attending: Ophthalmology

## 2022-02-28 ENCOUNTER — Ambulatory Visit
Admission: RE | Admit: 2022-02-28 | Discharge: 2022-02-28 | Disposition: A | Discharge status: Home / Self Care | Payer: Medicare HMO | Attending: Ophthalmology | Admitting: Ophthalmology

## 2022-02-28 DIAGNOSIS — H2512 Age-related nuclear cataract, left eye: Secondary | ICD-10-CM | POA: Diagnosis not present

## 2022-02-28 DIAGNOSIS — R7303 Prediabetes: Secondary | ICD-10-CM

## 2022-02-28 DIAGNOSIS — Z853 Personal history of malignant neoplasm of breast: Secondary | ICD-10-CM | POA: Diagnosis not present

## 2022-02-28 DIAGNOSIS — Z8521 Personal history of malignant neoplasm of larynx: Secondary | ICD-10-CM | POA: Diagnosis not present

## 2022-02-28 DIAGNOSIS — K219 Gastro-esophageal reflux disease without esophagitis: Secondary | ICD-10-CM | POA: Diagnosis not present

## 2022-02-28 DIAGNOSIS — Z87891 Personal history of nicotine dependence: Secondary | ICD-10-CM | POA: Diagnosis not present

## 2022-02-28 DIAGNOSIS — J449 Chronic obstructive pulmonary disease, unspecified: Secondary | ICD-10-CM | POA: Diagnosis not present

## 2022-02-28 DIAGNOSIS — Z923 Personal history of irradiation: Secondary | ICD-10-CM | POA: Insufficient documentation

## 2022-02-28 DIAGNOSIS — Z9221 Personal history of antineoplastic chemotherapy: Secondary | ICD-10-CM | POA: Diagnosis not present

## 2022-02-28 DIAGNOSIS — I2510 Atherosclerotic heart disease of native coronary artery without angina pectoris: Secondary | ICD-10-CM | POA: Diagnosis not present

## 2022-02-28 HISTORY — PX: CATARACT EXTRACTION W/PHACO: SHX586

## 2022-02-28 SURGERY — PHACOEMULSIFICATION, CATARACT, WITH IOL INSERTION
Anesthesia: Monitor Anesthesia Care | Site: Eye | Laterality: Left

## 2022-02-28 MED ORDER — LIDOCAINE HCL (PF) 2 % IJ SOLN
INTRAOCULAR | Status: DC | PRN
  Administered 2022-02-28: 1 mL via INTRAOCULAR

## 2022-02-28 MED ORDER — SIGHTPATH DOSE#1 BSS IO SOLN
INTRAOCULAR | Status: DC | PRN
  Administered 2022-02-28: 71 mL via OPHTHALMIC

## 2022-02-28 MED ORDER — ARMC OPHTHALMIC DILATING DROPS
1.0000 | OPHTHALMIC | Status: DC | PRN
  Administered 2022-02-28 (×3): 1 via OPHTHALMIC

## 2022-02-28 MED ORDER — BRIMONIDINE TARTRATE-TIMOLOL 0.2-0.5 % OP SOLN
OPHTHALMIC | Status: DC | PRN
  Administered 2022-02-28: 1 [drp] via OPHTHALMIC

## 2022-02-28 MED ORDER — SIGHTPATH DOSE#1 BSS IO SOLN
INTRAOCULAR | Status: DC | PRN
  Administered 2022-02-28: 15 mL

## 2022-02-28 MED ORDER — FENTANYL CITRATE (PF) 100 MCG/2ML IJ SOLN
INTRAMUSCULAR | Status: DC | PRN
  Administered 2022-02-28 (×2): 50 ug via INTRAVENOUS

## 2022-02-28 MED ORDER — MOXIFLOXACIN HCL 0.5 % OP SOLN
OPHTHALMIC | Status: DC | PRN
  Administered 2022-02-28: 0.2 mL via OPHTHALMIC

## 2022-02-28 MED ORDER — LACTATED RINGERS IV SOLN: INTRAVENOUS | Status: DC

## 2022-02-28 MED ORDER — TETRACAINE HCL 0.5 % OP SOLN
1.0000 [drp] | OPHTHALMIC | Status: DC | PRN
  Administered 2022-02-28 (×3): 1 [drp] via OPHTHALMIC

## 2022-02-28 MED ORDER — SIGHTPATH DOSE#1 NA HYALUR & NA CHOND-NA HYALUR IO KIT
PACK | INTRAOCULAR | Status: DC | PRN
  Administered 2022-02-28: 1 via OPHTHALMIC

## 2022-02-28 MED ORDER — MIDAZOLAM HCL 2 MG/2ML IJ SOLN
INTRAMUSCULAR | Status: DC | PRN
  Administered 2022-02-28 (×2): 1 mg via INTRAVENOUS

## 2022-02-28 SURGICAL SUPPLY — 13 items
CATARACT SUITE SIGHTPATH (MISCELLANEOUS) ×1 IMPLANT
DISSECTOR HYDRO NUCLEUS 50X22 (MISCELLANEOUS) ×1 IMPLANT
FEE CATARACT SUITE SIGHTPATH (MISCELLANEOUS) ×1 IMPLANT
GLOVE SURG GAMMEX PI TX LF 7.5 (GLOVE) ×1 IMPLANT
GLOVE SURG SYN 8.5  E (GLOVE) ×1
GLOVE SURG SYN 8.5 E (GLOVE) ×1 IMPLANT
GLOVE SURG SYN 8.5 PF PI (GLOVE) ×1 IMPLANT
LENS IOL TECNIS EYHANCE 20.5 (Intraocular Lens) IMPLANT
NDL FILTER BLUNT 18X1 1/2 (NEEDLE) ×1 IMPLANT
NEEDLE FILTER BLUNT 18X1 1/2 (NEEDLE) ×1 IMPLANT
SYR 3ML LL SCALE MARK (SYRINGE) ×1 IMPLANT
SYR 5ML LL (SYRINGE) ×1 IMPLANT
WATER STERILE IRR 250ML POUR (IV SOLUTION) ×1 IMPLANT

## 2022-02-28 NOTE — Anesthesia Postprocedure Evaluation (Signed)
Anesthesia Post Note  Patient: Denise Watts  Procedure(s) Performed: CATARACT EXTRACTION PHACO AND INTRAOCULAR LENS PLACEMENT (IOC) LEFT DIABETIC 4.11 00:26.1 (Left: Eye)  Patient location during evaluation: PACU Anesthesia Type: MAC Level of consciousness: awake and alert Pain management: pain level controlled Vital Signs Assessment: post-procedure vital signs reviewed and stable Respiratory status: spontaneous breathing, nonlabored ventilation, respiratory function stable and patient connected to nasal cannula oxygen Cardiovascular status: stable and blood pressure returned to baseline Postop Assessment: no apparent nausea or vomiting Anesthetic complications: no   No notable events documented.   Last Vitals:  Vitals:   02/28/22 1035 02/28/22 1040  BP: 119/72 (!) 120/57  Pulse: 89   Resp: 18   Temp: 36.6 C   SpO2: 94%     Last Pain:  Vitals:   02/28/22 1040  TempSrc:   PainSc: 0-No pain                 Martha Clan

## 2022-02-28 NOTE — Transfer of Care (Signed)
Immediate Anesthesia Transfer of Care Note  Patient: Denise Watts  Procedure(s) Performed: CATARACT EXTRACTION PHACO AND INTRAOCULAR LENS PLACEMENT (IOC) LEFT DIABETIC 4.11 00:26.1 (Left: Eye)  Patient Location: PACU  Anesthesia Type: MAC  Level of Consciousness: awake, alert  and patient cooperative  Airway and Oxygen Therapy: Patient Spontanous Breathing and Patient connected to supplemental oxygen  Post-op Assessment: Post-op Vital signs reviewed, Patient's Cardiovascular Status Stable, Respiratory Function Stable, Patent Airway and No signs of Nausea or vomiting  Post-op Vital Signs: Reviewed and stable  Complications: No notable events documented.

## 2022-02-28 NOTE — Anesthesia Preprocedure Evaluation (Signed)
Anesthesia Evaluation  Patient identified by MRN, date of birth, ID band Patient awake    Reviewed: Allergy & Precautions, H&P , NPO status , Patient's Chart, lab work & pertinent test results, reviewed documented beta blocker date and time   History of Anesthesia Complications Negative for: history of anesthetic complications  Airway Mallampati: II  TM Distance: >3 FB Neck ROM: full    Dental  (+) Dental Advidsory Given, Lower Dentures, Upper Dentures   Pulmonary neg shortness of breath, COPD,  oxygen dependent, neg recent URI, former smoker,    Pulmonary exam normal        Cardiovascular Exercise Tolerance: Good + CAD  Normal cardiovascular exam Rhythm:regular Rate:Normal     Neuro/Psych neg Seizures PSYCHIATRIC DISORDERS Anxiety Depression  Neuromuscular disease    GI/Hepatic Neg liver ROS, hiatal hernia, GERD  Medicated,  Endo/Other  negative endocrine ROS  Renal/GU negative Renal ROS  negative genitourinary   Musculoskeletal   Abdominal   Peds  Hematology negative hematology ROS (+)   Anesthesia Other Findings H/o vocal cord cancer s/p radiation  Past Medical History: No date: Arthritis     Comment:  KNEE RIGHT 1997: Breast cancer ([place])     Comment:  right breast lumpectomy with radiation 11/15/2017: Breast cancer ([place])     Comment:  INVASIVE MAMMARY CARCINOMA/ triple negative 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[place]) 03/02/2017: Cough     Comment:  INTERMTTENT DRY COUGH No date: GERD (gastroesophageal reflux disease) No date: History of hiatal hernia No date: Personal history of chemotherapy No date: Personal history of radiation therapy No date: Skin cancer 1998: Throat cancer [facility])     Comment:  radiation Past Surgical History: 1997: BREAST BIOPSY; Right     Comment:  positive 11/15/2017: BREAST BIOPSY; Right     Comment:  INVASIVE MAMMARY CARCINOMA triple negative 1997: BREAST LUMPECTOMY;  Right     Comment:  2019 also 12/08/2017: BREAST LUMPECTOMY WITH SENTINEL LYMPH NODE BIOPSY; Right     Comment:  Procedure: BREAST LUMPECTOMY WITH SENTINEL LYMPH NODE               BX;  Surgeon: Robert Bellow, MD;  Location: [facility]               ORS;  Service: General;  Laterality: Right; 2015: COLONOSCOPY No date: ESOPHAGUS SURGERY No date: KNEE SURGERY; Right No date: PORT-A-CATH REMOVAL 01/17/2018: PORTACATH PLACEMENT; Right     Comment:  Procedure: INSERTION PORT-A-CATH- RIGHT;  Surgeon:               Robert Bellow, MD;  Location: [place] [facility];  Service:               General;  Laterality: Right; 12/28/2016: ROBOTIC ADRENALECTOMY; Right     Comment:  Procedure: ROBOTIC ADRENALECTOMY;  Surgeon: Hollice Espy, MD;  Location: [facility];  Service: Urology;                Laterality: Right; No date: SKIN CANCER EXCISION 1998: THROAT SURGERY     Comment:  throat cancer  No date: TUBAL LIGATION 03/03/2017: VENTRAL HERNIA REPAIR; N/A     Comment:  Procedure: HERNIA REPAIR VENTRAL ADULT;  Surgeon:               Clayburn Pert, MD;  Location: [facility];  Service:  General;  Laterality: N/A; BMI    Body Mass Index: 23.43 kg/m     Reproductive/Obstetrics negative OB ROS                             Anesthesia Physical  Anesthesia Plan  ASA: 3  Anesthesia Plan: MAC   Post-op Pain Management: Minimal or no pain anticipated   Induction: Intravenous  PONV Risk Score and Plan: Propofol infusion and TIVA  Airway Management Planned: Natural Airway and Nasal Cannula  Additional Equipment:   Intra-op Plan:   Post-operative Plan:   Informed Consent: I have reviewed the patients History and Physical, chart, labs and discussed the procedure including the risks, benefits and alternatives for the proposed anesthesia with the patient or authorized representative who has indicated his/her understanding and acceptance.     Dental  Advisory Given  Plan Discussed with: CRNA  Anesthesia Plan Comments: (Patient consented for risks of anesthesia including but not limited to:  - adverse reactions to medications - risk of airway placement if required - damage to eyes, teeth, lips or other oral mucosa - nerve damage due to positioning  - sore throat or hoarseness - Damage to heart, brain, nerves, lungs, other parts of body or loss of life  Patient voiced understanding.)        Anesthesia Quick Evaluation

## 2022-02-28 NOTE — Op Note (Signed)
OPERATIVE NOTE  Denise Watts [id number] 02/28/2022   PREOPERATIVE DIAGNOSIS:  Nuclear sclerotic cataract left eye.  H25.12   POSTOPERATIVE DIAGNOSIS:    Nuclear sclerotic cataract left eye.     PROCEDURE:  Phacoemusification with posterior chamber intraocular lens placement of the left eye   LENS:   Implant Name Type Inv. Item Serial No. Manufacturer Lot No. LRB No. Used Action  LENS IOL TECNIS EYHANCE 20.5 - [id number] Intraocular Lens LENS IOL TECNIS EYHANCE 20.5 [id number] [employer]  Left 1 Implanted      Procedure(s): CATARACT EXTRACTION PHACO AND INTRAOCULAR LENS PLACEMENT (IOC) LEFT DIABETIC 4.11 00:26.1 (Left)    SURGEON:  Benay Pillow, MD, MPH   ANESTHESIA:  Topical with tetracaine drops augmented with 1% preservative-free intracameral lidocaine.  ESTIMATED BLOOD LOSS: <1 mL   COMPLICATIONS:  None.   DESCRIPTION OF PROCEDURE:  The patient was identified in the holding room and transported to the operating room and placed in the supine position under the operating microscope.  The left eye was identified as the operative eye and it was prepped and draped in the usual sterile ophthalmic fashion.   A 1.0 millimeter clear-corneal paracentesis was made at the 5:00 position. 0.5 ml of preservative-free 1% lidocaine with epinephrine was injected into the anterior chamber.  The anterior chamber was filled with viscoelastic.  A 2.4 millimeter keratome was used to make a near-clear corneal incision at the 2:00 position.  A curvilinear capsulorrhexis was made with a cystotome and capsulorrhexis forceps.  Balanced salt solution was used to hydrodissect and hydrodelineate the nucleus.   Phacoemulsification was then used in stop and chop fashion to remove the lens nucleus and epinucleus.  The remaining cortex was then removed using the irrigation and aspiration handpiece. Viscoelastic was then placed into the capsular bag to distend it for lens placement.  A lens was then injected into  the capsular bag.  The remaining viscoelastic was aspirated.   Wounds were hydrated with balanced salt solution.  The anterior chamber was inflated to a physiologic pressure with balanced salt solution.  Intracameral vigamox 0.1 mL undiltued was injected into the eye and a drop placed onto the ocular surface.  Combigan drops were placed on the eye.   No wound leaks were noted.  The patient was taken to the recovery room in stable condition without complications of anesthesia or surgery    Benay Pillow 02/28/2022, 10:27 AM

## 2022-02-28 NOTE — H&P (Signed)
[facility]   Primary Care Physician:  [name], FNP Ophthalmologist: Dr. Benay Pillow  Pre-Procedure History & Physical: HPI:  Denise Watts is a 66 y.o. female here for cataract surgery.   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   Cough 03/02/2017   INTERMTTENT DRY COUGH   Depression    GERD (gastroesophageal reflux disease)    History of hiatal hernia    Personal history of chemotherapy    Personal history of radiation therapy    Skin cancer    Throat cancer ([place]) 1998   radiation   Wears dentures    full upper and lower    Past Surgical History:  Procedure Laterality Date   BREAST BIOPSY Right 1997   positive   BREAST BIOPSY Right 11/15/2017   INVASIVE MAMMARY CARCINOMA triple negative   BREAST BIOPSY Right 07/20/2020   [place] bx, Q marker, neg   BREAST LUMPECTOMY Right 1997   2019 also   BREAST LUMPECTOMY WITH SENTINEL LYMPH NODE BIOPSY Right 12/08/2017   Procedure: BREAST LUMPECTOMY WITH SENTINEL LYMPH NODE BX;  Surgeon: Robert Bellow, MD;  Location: [facility];  Service: General;  Laterality: Right;   BREAST REDUCTION WITH MASTOPEXY Left 06/10/2019   Procedure: Left breast mastopexy reduction for symmetry;  Surgeon: Wallace Going, DO;  Location: [place];  Service: Plastics;  Laterality: Left;  total 2.5 hours   CATARACT EXTRACTION W/PHACO Right 02/07/2022   Procedure: CATARACT EXTRACTION PHACO AND INTRAOCULAR LENS PLACEMENT (IOC) RIGHT DIABETIC;  Surgeon: Eulogio Bear, MD;  Location: [place];  Service: Ophthalmology;  Laterality: Right;  3.60 00:34.7   COLONOSCOPY  2015   COLONOSCOPY WITH PROPOFOL N/A 10/29/2019   Procedure: COLONOSCOPY WITH PROPOFOL;  Surgeon: Lucilla Lame, MD;   Location: [facility] ENDOSCOPY;  Service: Endoscopy;  Laterality: N/A;   ESOPHAGOGASTRODUODENOSCOPY (EGD) WITH PROPOFOL N/A 05/24/2019   Procedure: ESOPHAGOGASTRODUODENOSCOPY (EGD) WITH PROPOFOL;  Surgeon: Lucilla Lame, MD;  Location: [facility] ENDOSCOPY;  Service: Endoscopy;  Laterality: N/A;   ESOPHAGUS SURGERY     KNEE SURGERY Right    LIPOSUCTION WITH LIPOFILLING Right 06/10/2019   Procedure: right breast scar and capsule release with fat grafting;  Surgeon: Wallace Going, DO;  Location: [place];  Service: Plastics;  Laterality: Right;   PORT-A-CATH REMOVAL     PORTACATH PLACEMENT Right 01/17/2018   Procedure: INSERTION PORT-A-CATH- RIGHT;  Surgeon: Robert Bellow, MD;  Location: [place] [facility];  Service: General;  Laterality: Right;   ROBOTIC ADRENALECTOMY Right 12/28/2016   Procedure: ROBOTIC ADRENALECTOMY;  Surgeon: Hollice Espy, MD;  Location: [facility];  Service: Urology;  Laterality: Right;   SKIN CANCER EXCISION     THROAT SURGERY  1998   throat cancer    TUBAL LIGATION     VENTRAL HERNIA REPAIR N/A 03/03/2017   Procedure: HERNIA REPAIR VENTRAL ADULT;  Surgeon: Clayburn Pert, MD;  Location: [facility];  Service: General;  Laterality: N/A;    Prior to Admission medications   Medication Sig Start Date End Date Taking? Authorizing Provider  albuterol (VENTOLIN HFA) 108 (90 Base) MCG/ACT inhaler Inhale 2 puffs into the lungs every 6 (six) hours as needed for shortness of breath. 01/29/21  Yes  Menshew, Dannielle Karvonen, PA-C  alum & mag hydroxide-simeth (MAALOX PLUS) 400-400-40 MG/5ML suspension Take 15 mLs by mouth every 6 (six) hours as needed for indigestion. 11/17/21  Yes [name], FNP  aspirin EC 81 MG tablet Take 1 tablet (81 mg total) by mouth daily. Swallow whole. 12/08/21  Yes Furth, Cadence H, PA-C  benzonatate (TESSALON) 100 MG capsule Take 2 capsules (200 mg total) by mouth 3 (three) times daily. 12/15/21  Yes [name], FNP   brompheniramine-pseudoephedrine-DM 30-2-10 MG/5ML syrup Take 5 mLs by mouth 4 (four) times daily as needed. 01/29/21  Yes Menshew, Dannielle Karvonen, PA-C  Budeson-Glycopyrrol-Formoterol (BREZTRI AEROSPHERE) 160-9-4.8 MCG/ACT AERO Inhale 2 puffs into the lungs in the morning and at bedtime. 12/15/21  Yes [name], FNP  buPROPion (WELLBUTRIN) 75 MG tablet Take 75 mg by mouth 2 (two) times daily. 05/28/21  Yes [provider]  busPIRone (BUSPAR) 5 MG tablet Take 1 tablet (5 mg total) by mouth 2 (two) times daily. 05/26/21  Yes [name], FNP  cyclobenzaprine (FLEXERIL) 5 MG tablet TAKE 1 TABLET BY MOUTH AT BEDTIME 02/23/22  Yes [name], FNP  DULoxetine (CYMBALTA) 60 MG capsule Take 1 capsule (60 mg total) by mouth daily. 05/26/21 05/26/22 Yes [name], FNP  fluconazole (DIFLUCAN) 150 MG tablet Take one by mouth for vaginal yeast; repeat in 4 days if symptoms remain 12/15/21  Yes [name], FNP  fluticasone [facility]) 50 MCG/ACT nasal spray Place 1 spray into both nostrils at bedtime. 06/19/20  Yes Mar Daring, PA-C  gabapentin (NEURONTIN) 300 MG capsule TAKE 1 CAPSULE BY MOUTH 3 TIMES DAILY 12/21/21  Yes Tally Joe T, FNP  guaiFENesin-codeine 100-10 MG/5ML syrup Take 10 mLs by mouth at bedtime and may repeat dose one time if needed. 12/02/21  Yes [name], FNP  guaiFENesin-dextromethorphan (ROBITUSSIN DM) 100-10 MG/5ML syrup Take 5 mLs by mouth every 4 (four) hours as needed for cough. 12/02/21  Yes [name], FNP  mirabegron ER (MYRBETRIQ) 25 MG TB24 tablet Take 1 tablet (25 mg total) by mouth daily. 02/08/22  Yes McGowan, Larene Beach A, PA-C  montelukast (SINGULAIR) 10 MG tablet Take 1 tablet (10 mg total) by mouth at bedtime. 05/26/21  Yes [name], FNP  nitrofurantoin, macrocrystal-monohydrate, (MACROBID) 100 MG capsule Take 1 capsule (100 mg total) by mouth every 12 (twelve) hours. 01/25/22  Yes McGowan, Larene Beach A, PA-C  omeprazole (PRILOSEC) 40 MG  capsule Take 40 mg by mouth daily.   Yes [provider]  rOPINIRole (REQUIP) 0.25 MG tablet Take 1 tablet (0.25 mg total) by mouth at bedtime. 11/03/21  Yes [name], FNP  rosuvastatin (CRESTOR) 40 MG tablet Take 1 tablet (40 mg total) by mouth daily. 12/15/21  Yes [name], FNP  sucralfate (CARAFATE) 1 g tablet Take 1 tablet (1 g total) by mouth 4 (four) times daily -  with meals and at bedtime. 12/15/21  Yes [name], FNP    Allergies as of 12/15/2021 - Review Complete 12/15/2021  Allergen Reaction Noted   Sulfa antibiotics Other (See Comments) 10/28/2020    Family History  Problem Relation Age of Onset   Diabetes Mother    Heart attack Mother    Cancer Sister        cancer of eyes, spread to lung, other places. died at 52   Cancer Sister 88       unknown primary, spread to bone, brain  died in 20's   Cancer Paternal Aunt        unk type   Cancer Paternal Grandmother        type unk   Prostate cancer Neg Hx    Kidney cancer Neg Hx    Bladder Cancer Neg Hx     Social History   Socioeconomic History   Marital status: Married    Spouse name: Not on file   Number of children: 4   Years of education: Not on file   Highest education level: 10th grade  Occupational History   Occupation: retired  Tobacco Use   Smoking status: Former    Packs/day: 1.50    Years: 30.00    Total pack years: 45.00    Types: Cigarettes    Quit date: 05/03/1995    Years since quitting: 26.8   Smokeless tobacco: Never  Vaping Use   Vaping Use: Never used  Substance and Sexual Activity   Alcohol use: Not Currently    Alcohol/week: 4.0 standard drinks of alcohol    Types: 4 Shots of liquor per week    Comment: none   Drug use: No   Sexual activity: Not Currently    Birth control/protection: Post-menopausal  Other Topics Concern   Not on file  Social History Narrative   Not on file   Social Determinants of Health   Financial Resource Strain: Low Risk  (12/02/2021)    Overall Financial Resource Strain (CARDIA)    Difficulty of Paying Living Expenses: Not very hard  Food Insecurity: No Food Insecurity (12/02/2021)   Hunger Vital Sign    Worried About Running Out of Food in the Last Year: Never true    Ran Out of Food in the Last Year: Never true  Transportation Needs: No Transportation Needs (12/02/2021)   PRAPARE - Hydrologist (Medical): No    Lack of Transportation (Non-Medical): No  Physical Activity: Insufficiently Active (12/02/2021)   Exercise Vital Sign    Days of Exercise per Week: 3 days    Minutes of Exercise per Session: 30 min  Stress: Stress Concern Present (12/02/2021)   [place]    Feeling of Stress : To some extent  Social Connections: Moderately Isolated (12/02/2021)   Social Connection and Isolation Panel [NHANES]    Frequency of Communication with Friends and Family: Three times a week    Frequency of Social Gatherings with Friends and Family: Twice a week    Attends Religious Services: Never    Marine scientist or Organizations: No    Attends Archivist Meetings: Never    Marital Status: Married  Human resources officer Violence: Not At Risk (12/02/2021)   Humiliation, Afraid, Rape, and Kick questionnaire    Fear of Current or Ex-Partner: No    Emotionally Abused: No    Physically Abused: No    Sexually Abused: No    Review of Systems: See HPI, otherwise negative ROS  Physical Exam: BP 118/65   Pulse 79   Temp (!) 97.2 F (36.2 C) (Temporal)   Resp 16   Ht '5\' 1"'$  (1.549 m)   Wt 66.9 kg   SpO2 98%   BMI 27.87 kg/m  General:   Alert, cooperative in NAD Head:  Normocephalic and atraumatic. Respiratory:  Normal work of breathing. Cardiovascular:  RRR  Impression/Plan: Denise Watts is here for cataract surgery.  Risks, benefits, limitations, and alternatives regarding cataract  surgery have been reviewed with the patient.   Questions have been answered.  All parties agreeable.   Benay Pillow, MD  02/28/2022, 9:59 AM

## 2022-03-01 ENCOUNTER — Encounter: Payer: Self-pay | Admitting: Ophthalmology

## 2022-03-07 ENCOUNTER — Ambulatory Visit
Admission: RE | Admit: 2022-03-07 | Discharge: 2022-03-07 | Disposition: A | Discharge status: Home / Self Care | Payer: Medicare HMO | Source: Ambulatory Visit | Attending: Oncology | Admitting: Oncology

## 2022-03-07 DIAGNOSIS — D2262 Melanocytic nevi of left upper limb, including shoulder: Secondary | ICD-10-CM | POA: Diagnosis not present

## 2022-03-07 DIAGNOSIS — D485 Neoplasm of uncertain behavior of skin: Secondary | ICD-10-CM | POA: Diagnosis not present

## 2022-03-07 DIAGNOSIS — D225 Melanocytic nevi of trunk: Secondary | ICD-10-CM | POA: Diagnosis not present

## 2022-03-07 DIAGNOSIS — Z853 Personal history of malignant neoplasm of breast: Secondary | ICD-10-CM | POA: Diagnosis not present

## 2022-03-07 DIAGNOSIS — Z08 Encounter for follow-up examination after completed treatment for malignant neoplasm: Secondary | ICD-10-CM | POA: Insufficient documentation

## 2022-03-07 DIAGNOSIS — X32XXXA Exposure to sunlight, initial encounter: Secondary | ICD-10-CM | POA: Diagnosis not present

## 2022-03-07 DIAGNOSIS — L570 Actinic keratosis: Secondary | ICD-10-CM | POA: Diagnosis not present

## 2022-03-07 DIAGNOSIS — D0462 Carcinoma in situ of skin of left upper limb, including shoulder: Secondary | ICD-10-CM | POA: Diagnosis not present

## 2022-03-07 DIAGNOSIS — D0472 Carcinoma in situ of skin of left lower limb, including hip: Secondary | ICD-10-CM | POA: Diagnosis not present

## 2022-03-07 DIAGNOSIS — L814 Other melanin hyperpigmentation: Secondary | ICD-10-CM | POA: Diagnosis not present

## 2022-03-07 DIAGNOSIS — D2261 Melanocytic nevi of right upper limb, including shoulder: Secondary | ICD-10-CM | POA: Diagnosis not present

## 2022-03-07 DIAGNOSIS — Z1231 Encounter for screening mammogram for malignant neoplasm of breast: Secondary | ICD-10-CM | POA: Diagnosis not present

## 2022-03-07 DIAGNOSIS — Z85828 Personal history of other malignant neoplasm of skin: Secondary | ICD-10-CM | POA: Diagnosis not present

## 2022-03-08 DIAGNOSIS — C44722 Squamous cell carcinoma of skin of right lower limb, including hip: Secondary | ICD-10-CM | POA: Diagnosis not present

## 2022-03-11 ENCOUNTER — Telehealth: Payer: Self-pay | Admitting: Family Medicine

## 2022-03-11 MED ORDER — OMEPRAZOLE 40 MG PO CPDR: 40.0000 mg | DELAYED_RELEASE_CAPSULE | Freq: Every day | ORAL | 0 refills | Status: DC

## 2022-03-11 NOTE — Telephone Encounter (Signed)
[place] faxed refill request for the following medications:   omeprazole (PRILOSEC) 40 MG capsule      Please advise.

## 2022-03-14 ENCOUNTER — Other Ambulatory Visit: Payer: Self-pay | Admitting: Family Medicine

## 2022-03-14 DIAGNOSIS — J411 Mucopurulent chronic bronchitis: Secondary | ICD-10-CM

## 2022-03-15 NOTE — Progress Notes (Deleted)
Established patient visit   Patient: Denise Watts   DOB: June 29, 1955   66 y.o. Female  MRN: [id number] Visit Date: 03/17/2022  Today's healthcare provider: [name], FNP   No chief complaint on file.  Subjective    HPI  ***  Medications: Outpatient Medications Prior to Visit  Medication Sig   albuterol (VENTOLIN HFA) 108 (90 Base) MCG/ACT inhaler Inhale 2 puffs into the lungs every 6 (six) hours as needed for shortness of breath.   alum & mag hydroxide-simeth (MAALOX PLUS) 400-400-40 MG/5ML suspension Take 15 mLs by mouth every 6 (six) hours as needed for indigestion.   aspirin EC 81 MG tablet Take 1 tablet (81 mg total) by mouth daily. Swallow whole.   benzonatate (TESSALON) 100 MG capsule Take 2 capsules (200 mg total) by mouth 3 (three) times daily.   brompheniramine-pseudoephedrine-DM 30-2-10 MG/5ML syrup Take 5 mLs by mouth 4 (four) times daily as needed.   Budeson-Glycopyrrol-Formoterol (BREZTRI AEROSPHERE) 160-9-4.8 MCG/ACT AERO Inhale 2 puffs into the lungs in the morning and at bedtime.   buPROPion (WELLBUTRIN) 75 MG tablet Take 75 mg by mouth 2 (two) times daily.   busPIRone (BUSPAR) 5 MG tablet Take 1 tablet (5 mg total) by mouth 2 (two) times daily.   cyclobenzaprine (FLEXERIL) 5 MG tablet TAKE 1 TABLET BY MOUTH AT BEDTIME   DULoxetine (CYMBALTA) 60 MG capsule Take 1 capsule (60 mg total) by mouth daily.   fluconazole (DIFLUCAN) 150 MG tablet Take one by mouth for vaginal yeast; repeat in 4 days if symptoms remain   fluticasone (FLONASE) 50 MCG/ACT nasal spray Place 1 spray into both nostrils at bedtime.   gabapentin (NEURONTIN) 300 MG capsule TAKE 1 CAPSULE BY MOUTH 3 TIMES DAILY   guaiFENesin-codeine 100-10 MG/5ML syrup Take 10 mLs by mouth at bedtime and may repeat dose one time if needed.   guaiFENesin-dextromethorphan (ROBITUSSIN DM) 100-10 MG/5ML syrup Take 5 mLs by mouth every 4 (four) hours as needed for cough.   mirabegron ER (MYRBETRIQ) 25 MG TB24  tablet Take 1 tablet (25 mg total) by mouth daily.   montelukast (SINGULAIR) 10 MG tablet Take 1 tablet (10 mg total) by mouth at bedtime.   nitrofurantoin, macrocrystal-monohydrate, (MACROBID) 100 MG capsule Take 1 capsule (100 mg total) by mouth every 12 (twelve) hours.   omeprazole (PRILOSEC) 40 MG capsule Take 1 capsule (40 mg total) by mouth daily.   rOPINIRole (REQUIP) 0.25 MG tablet Take 1 tablet (0.25 mg total) by mouth at bedtime.   rosuvastatin (CRESTOR) 40 MG tablet Take 1 tablet (40 mg total) by mouth daily.   sucralfate (CARAFATE) 1 g tablet Take 1 tablet (1 g total) by mouth 4 (four) times daily -  with meals and at bedtime.   Facility-Administered Medications Prior to Visit  Medication Dose Route Frequency Provider   cyanocobalamin ((VITAMIN B-12)) injection 1,000 mcg  1,000 mcg Intramuscular Q30 days Sindy Guadeloupe, MD    Review of Systems  {Labs  Heme  Chem  Endocrine  Serology  Results Review (optional):23779}   Objective    There were no vitals taken for this visit. {Show previous vital signs (optional):23777}  Physical Exam  ***  No results found for any visits on 03/17/22.  Assessment & Plan     ***  No follow-ups on file.      {provider attestation***:1}   [name], [place] [phone] (phone) [phone] (fax)  [place]

## 2022-03-16 ENCOUNTER — Inpatient Hospital Stay (HOSPITAL_BASED_OUTPATIENT_CLINIC_OR_DEPARTMENT_OTHER): Payer: Medicare HMO | Admitting: Oncology

## 2022-03-16 ENCOUNTER — Encounter: Payer: Self-pay | Admitting: Oncology

## 2022-03-16 ENCOUNTER — Inpatient Hospital Stay: Payer: Medicare HMO

## 2022-03-16 ENCOUNTER — Inpatient Hospital Stay: Payer: Medicare HMO | Attending: Oncology

## 2022-03-16 VITALS — BP 162/85 | HR 88 | Temp 97.5°F | Resp 16 | Wt 150.2 lb

## 2022-03-16 DIAGNOSIS — C50511 Malignant neoplasm of lower-outer quadrant of right female breast: Secondary | ICD-10-CM | POA: Diagnosis not present

## 2022-03-16 DIAGNOSIS — Z171 Estrogen receptor negative status [ER-]: Secondary | ICD-10-CM

## 2022-03-16 DIAGNOSIS — Z23 Encounter for immunization: Secondary | ICD-10-CM

## 2022-03-16 DIAGNOSIS — R911 Solitary pulmonary nodule: Secondary | ICD-10-CM | POA: Diagnosis not present

## 2022-03-16 DIAGNOSIS — E538 Deficiency of other specified B group vitamins: Secondary | ICD-10-CM

## 2022-03-16 DIAGNOSIS — Z8521 Personal history of malignant neoplasm of larynx: Secondary | ICD-10-CM | POA: Diagnosis not present

## 2022-03-16 DIAGNOSIS — Z85828 Personal history of other malignant neoplasm of skin: Secondary | ICD-10-CM | POA: Diagnosis not present

## 2022-03-16 DIAGNOSIS — D519 Vitamin B12 deficiency anemia, unspecified: Secondary | ICD-10-CM | POA: Diagnosis not present

## 2022-03-16 DIAGNOSIS — Z808 Family history of malignant neoplasm of other organs or systems: Secondary | ICD-10-CM | POA: Diagnosis not present

## 2022-03-16 DIAGNOSIS — Z853 Personal history of malignant neoplasm of breast: Secondary | ICD-10-CM | POA: Diagnosis not present

## 2022-03-16 DIAGNOSIS — Z87891 Personal history of nicotine dependence: Secondary | ICD-10-CM | POA: Diagnosis not present

## 2022-03-16 LAB — CBC WITH DIFFERENTIAL/PLATELET
Abs Immature Granulocytes: 0.02 10*3/uL (ref 0.00–0.07)
Basophils Absolute: 0 10*3/uL (ref 0.0–0.1)
Basophils Relative: 1 %
Eosinophils Absolute: 0.2 10*3/uL (ref 0.0–0.5)
Eosinophils Relative: 4 %
HCT: 42.9 % (ref 36.0–46.0)
Hemoglobin: 13.8 g/dL (ref 12.0–15.0)
Immature Granulocytes: 0 %
Lymphocytes Relative: 23 %
Lymphs Abs: 1.1 10*3/uL (ref 0.7–4.0)
MCH: 32.5 pg (ref 26.0–34.0)
MCHC: 32.2 g/dL (ref 30.0–36.0)
MCV: 101.2 fL — ABNORMAL HIGH (ref 80.0–100.0)
Monocytes Absolute: 0.5 10*3/uL (ref 0.1–1.0)
Monocytes Relative: 11 %
Neutro Abs: 2.8 10*3/uL (ref 1.7–7.7)
Neutrophils Relative %: 61 %
Platelets: 179 10*3/uL (ref 150–400)
RBC: 4.24 MIL/uL (ref 3.87–5.11)
RDW: 11.8 % (ref 11.5–15.5)
WBC: 4.6 10*3/uL (ref 4.0–10.5)
nRBC: 0 % (ref 0.0–0.2)

## 2022-03-16 LAB — COMPREHENSIVE METABOLIC PANEL
ALT: 17 U/L (ref 0–44)
AST: 22 U/L (ref 15–41)
Albumin: 3.8 g/dL (ref 3.5–5.0)
Alkaline Phosphatase: 59 U/L (ref 38–126)
Anion gap: 8 (ref 5–15)
BUN: 17 mg/dL (ref 8–23)
CO2: 29 mmol/L (ref 22–32)
Calcium: 9.1 mg/dL (ref 8.9–10.3)
Chloride: 100 mmol/L (ref 98–111)
Creatinine, Ser: 0.73 mg/dL (ref 0.44–1.00)
GFR, Estimated: >60 mL/min (ref >60)
Glucose, Bld: 95 mg/dL (ref 70–99)
Potassium: 4.8 mmol/L (ref 3.5–5.1)
Sodium: 137 mmol/L (ref 135–145)
Total Bilirubin: 0.6 mg/dL (ref 0.3–1.2)
Total Protein: 6.6 g/dL (ref 6.5–8.1)

## 2022-03-16 LAB — IRON AND TIBC
Iron: 137 ug/dL (ref 28–170)
Saturation Ratios: 43 % — ABNORMAL HIGH (ref 10.4–31.8)
TIBC: 321 ug/dL (ref 250–450)
UIBC: 184 ug/dL

## 2022-03-16 LAB — FERRITIN: Ferritin: 157 ng/mL (ref 11–307)

## 2022-03-16 LAB — VITAMIN B12: Vitamin B-12: 178 pg/mL — ABNORMAL LOW (ref 180–914)

## 2022-03-16 MED ORDER — SODIUM CHLORIDE 0.9 % IV SOLN
Freq: Once | INTRAVENOUS | Status: DC | PRN
  Filled 2022-03-16: qty 250

## 2022-03-16 MED ORDER — EPINEPHRINE 0.3 MG/0.3ML IJ SOAJ: 0.3000 mg | Freq: Once | INTRAMUSCULAR | Status: DC | PRN

## 2022-03-16 MED ORDER — CYANOCOBALAMIN 1000 MCG/ML IJ SOLN
1000.0000 ug | INTRAMUSCULAR | Status: AC
  Administered 2022-03-16: 1000 ug via INTRAMUSCULAR
  Filled 2022-03-16: qty 1

## 2022-03-16 MED ORDER — METHYLPREDNISOLONE SODIUM SUCC 125 MG IJ SOLR: 125.0000 mg | Freq: Once | INTRAMUSCULAR | Status: DC | PRN

## 2022-03-16 MED ORDER — ALBUTEROL SULFATE (2.5 MG/3ML) 0.083% IN NEBU
2.5000 mg | INHALATION_SOLUTION | Freq: Once | RESPIRATORY_TRACT | Status: DC | PRN
  Filled 2022-03-16: qty 3

## 2022-03-16 MED ORDER — FAMOTIDINE IN NACL 20-0.9 MG/50ML-% IV SOLN: 20.0000 mg | Freq: Once | INTRAVENOUS | Status: DC | PRN

## 2022-03-16 MED ORDER — DIPHENHYDRAMINE HCL 50 MG/ML IJ SOLN: 50.0000 mg | Freq: Once | INTRAMUSCULAR | Status: DC | PRN

## 2022-03-16 NOTE — Progress Notes (Signed)
Hematology/Oncology Consult note [facility]  [phone] [phone]  Patient Care Team: [name], FNP as PCP - General (Family Medicine) Kate Sable, MD as PCP - Cardiology (Cardiology) Sindy Guadeloupe, MD as Consulting Physician (Oncology) Jules Husbands, MD as Consulting Physician (General Surgery) Dasher, Rayvon Char, MD (Dermatology) Scheeler, Carola Rhine, PA-C as Physician Assistant (Plastic Surgery) Harvest Dark, MD as Attending Physician (Emergency Medicine) Sunday Corn Mervin Kung (Neurology) Marlowe Sax, MD as Referring Physician (Internal Medicine) Chesley Mires, MD as Consulting Physician (Pulmonary Disease) Germaine Pomfret, [facility] (Pharmacist)   Name of the patient: Denise Watts  [id number]  [birth year]   Date of visit: 03/16/22  Diagnosis- 1. H/o breast cancer in 1997 2. H/o stage I SCC of vocal cord in 2015. 3. Lung nodules 4. Adrenal carcinoma of unknown primary s/p resection 5. Stage IaT1a NxcM0 ER weakly positive 1-10%, PR negative and her 2 negative    Chief complaint/ Reason for visit-routine follow-up of breast cancer and B12 deficiency anemia  Heme/Onc history:  Oncology History Overview Note  1. Carcinoma of breast, T1, N0, M0 tumor diagnosis.  In January of 1997 2. Carcinoma of the vocal cord T1, N0, M0 tumor.  Status postradiation therapy 3. Abnormal CT scan of the chest (December, 2015) 4. Repeat CT scan of chest shows stable nodule (July, 2016) 5. Neutropenia with normal hemoglobin and normal platelet count (July, 2016)   Cancer of vocal cord [facility])  11/10/2014 Initial Diagnosis   Cancer of vocal cord   Malignant neoplasm of lower-outer quadrant of right breast of female, estrogen receptor negative ([place])  11/24/2017 Initial Diagnosis   Malignant neoplasm of lower-outer quadrant of right breast of female, estrogen receptor negative ([place])   01/12/2018 Cancer Staging   Staging form:  Breast, AJCC 8th Edition - Clinical stage from 01/12/2018: Stage IB (cT1a, cN0, cM0, G3, ER+, PR-, HER2-) - Signed by Sindy Guadeloupe, MD on 01/12/2018   01/30/2018 - 04/26/2018 Chemotherapy   The patient had dexamethasone (DECADRON) 4 MG tablet, 8 mg, Oral, 2 times daily, 1 of 1 cycle, Start date: 01/12/2018, End date: 08/03/2018 palonosetron (ALOXI) injection 0.25 mg, 0.25 mg, Intravenous,  Once, 4 of 4 cycles Administration: 0.25 mg (01/30/2018), 0.25 mg (02/27/2018), 0.25 mg (03/28/2018), 0.25 mg (04/26/2018) pegfilgrastim (NEULASTA ONPRO KIT) injection 6 mg, 6 mg, Subcutaneous, Once, 4 of 4 cycles Administration: 6 mg (01/30/2018), 6 mg (02/27/2018), 6 mg (03/28/2018), 6 mg (04/26/2018) cyclophosphamide (CYTOXAN) 1,000 mg in sodium chloride 0.9 % 250 mL chemo infusion, 600 mg/m2 = 1,000 mg, Intravenous,  Once, 4 of 4 cycles Administration: 1,000 mg (01/30/2018), 1,000 mg (02/27/2018), 1,000 mg (03/28/2018), 1,000 mg (04/26/2018) DOCEtaxel (TAXOTERE) 130 mg in sodium chloride 0.9 % 250 mL chemo infusion, 75 mg/m2 = 130 mg, Intravenous,  Once, 4 of 4 cycles Dose modification: 60 mg/m2 (original dose 75 mg/m2, Cycle 2, Reason: Dose not tolerated) Administration: 130 mg (01/30/2018), 100 mg (02/27/2018), 100 mg (03/28/2018), 100 mg (04/26/2018)  for chemotherapy treatment.     The patient was originally diagnosed with right breast cancer in 1997, 10 mm grade 3, ER pr positive her 2 negative, node negative status post surgery and radiation treatment only. She never received chemotherapy or hormone therapy. For her vocal cord cancer, she had extensive surgery and radiation therapy in 1998. Due to past history of smoking, she was also noted to have lung nodules. She is undergoing active surveillance imaging study of the chest.   Patient  noted to have adrenal mass on CT chest which prompted PET CT in July 2018 which showed: IMPRESSION: 1. Right adrenal mass with peripheral hypermetabolism. Given interval  development since 11/18/2015, suspicious for either isolated metastasis or a primary adrenal neoplasm. This should be considered for biopsy. 2. No evidence of hypermetabolic primary malignancy or extraadrenal metastasis. Pulmonary nodules are not significantly hypermetabolic. 3. Coronary artery atherosclerosis. Aortic Atherosclerosis (ICD10-I70.0). 4. Hepatic steatosis. 5. Left sixth rib hypermetabolism is favored to be related to remote Trauma.   Patient underwent right adrenalectomy which showed: DIAGNOSIS:  A. ADRENAL GLAND, RIGHT; ADRENALECTOMY:  - HIGH GRADE CARCINOMA WITH EXTENSIVE NECROSIS.  - MARGINS OF THE SPECIMEN ARE NEGATIVE FOR CARCINOMA.   Comment:  Sections demonstrate abundant necrosis confined to the adrenal medullary  compartment. In one block of tissue a small fragment of viable malignant  neoplasm was identified. A panel of immunohistochemical stains was  performed with the following pattern of immunoreactivity:  Super pancytokeratin: Positive  GATA-3: Negative  TTF-1: Negative  P40: Negative  Napsin: Negative  PAX-8: Negative  Chromogranin: Negative     Repeat CT chest abdomen and pelvis in July 2019 showed stable left lower lobe pulmonary nodule.  No other evidence of metastatic disease.  New 4.5 mm nodule in the right breast.  This was followed by ultrasound mammogram and core biopsy of the breast lesion which was a grade 3 triple negative breast cancer.  Patient was seen by Dr. Bary Castilla and underwent lumpectomy on 12/08/2017.   Patient had a 5 mm weakly ER positive tumor.  Given her ongoing fatigue Oncotype testing was done to see if she could potentially avoid chemotherapy.  Oncotype shows a recurrence score of 53 with her risk of distant recurrence at 9 years at greater than 39% with hormone therapy alone.  Absolute benefit of chemotherapy was greater than 15% in her age group.  Patient completed 4 cycles of adjuvant TC chemotherapy in December 2019     Interval history-patient reports ongoing fatigue.  States that she has bilateral diffuse pain in her breasts  ECOG PS- 1 Pain scale- 4   Review of systems- Review of Systems  Constitutional:  Positive for malaise/fatigue. Negative for chills, fever and weight loss.  HENT:  Negative for congestion, ear discharge and nosebleeds.   Eyes:  Negative for blurred vision.  Respiratory:  Negative for cough, hemoptysis, sputum production, shortness of breath and wheezing.   Cardiovascular:  Negative for chest pain, palpitations, orthopnea and claudication.  Gastrointestinal:  Negative for abdominal pain, blood in stool, constipation, diarrhea, heartburn, melena, nausea and vomiting.  Genitourinary:  Negative for dysuria, flank pain, frequency, hematuria and urgency.  Musculoskeletal:  Negative for back pain, joint pain and myalgias.  Skin:  Negative for rash.  Neurological:  Negative for dizziness, tingling, focal weakness, seizures, weakness and headaches.  Endo/Heme/Allergies:  Does not bruise/bleed easily.  Psychiatric/Behavioral:  Negative for depression and suicidal ideas. The patient does not have insomnia.       Allergies  Allergen Reactions   Sulfa Antibiotics Other (See Comments)    Mouth sore and raw     Past Medical History:  Diagnosis Date   Anxiety    Arthritis    KNEE RIGHT   Breast cancer ([place]) 1997   right breast lumpectomy with radiation   Breast cancer ([place]) 11/15/2017   INVASIVE MAMMARY CARCINOMA/ triple negative   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[place]) 11/10/2014   Cough 03/02/2017   INTERMTTENT DRY COUGH   Depression  GERD (gastroesophageal reflux disease)    History of hiatal hernia    Personal history of chemotherapy    Personal history of radiation therapy    Skin cancer    Throat cancer ([place]) 1998   radiation   Wears dentures    full upper and lower     Past Surgical History:  Procedure Laterality Date   BREAST BIOPSY Right 1997   positive   BREAST  BIOPSY Right 11/15/2017   INVASIVE MAMMARY CARCINOMA triple negative   BREAST BIOPSY Right 07/20/2020   [place] bx, Q marker, neg   BREAST LUMPECTOMY Right 1997   2019 also   BREAST LUMPECTOMY WITH SENTINEL LYMPH NODE BIOPSY Right 12/08/2017   Procedure: BREAST LUMPECTOMY WITH SENTINEL LYMPH NODE BX;  Surgeon: Robert Bellow, MD;  Location: [facility];  Service: General;  Laterality: Right;   BREAST REDUCTION WITH MASTOPEXY Left 06/10/2019   Procedure: Left breast mastopexy reduction for symmetry;  Surgeon: Wallace Going, DO;  Location: [facility];  Service: Plastics;  Laterality: Left;  total 2.5 hours   CATARACT EXTRACTION W/PHACO Right 02/07/2022   Procedure: CATARACT EXTRACTION PHACO AND INTRAOCULAR LENS PLACEMENT (IOC) RIGHT DIABETIC;  Surgeon: Eulogio Bear, MD;  Location: [place];  Service: Ophthalmology;  Laterality: Right;  3.60 00:34.7   CATARACT EXTRACTION W/PHACO Left 02/28/2022   Procedure: CATARACT EXTRACTION PHACO AND INTRAOCULAR LENS PLACEMENT (IOC) LEFT DIABETIC 4.11 00:26.1;  Surgeon: Eulogio Bear, MD;  Location: [place];  Service: Ophthalmology;  Laterality: Left;   COLONOSCOPY  2015   COLONOSCOPY WITH PROPOFOL N/A 10/29/2019   Procedure: COLONOSCOPY WITH PROPOFOL;  Surgeon: Lucilla Lame, MD;  Location[facility];  Service: Endoscopy;  Laterality: N/A;   ESOPHAGOGASTRODUODENOSCOPY (EGD) WITH PROPOFOL N/A 05/24/2019   Procedure: ESOPHAGOGASTRODUODENOSCOPY (EGD) WITH PROPOFOL;  Surgeon: Lucilla Lame, MD;  Location: [facility] ENDOSCOPY;  Service: Endoscopy;  Laterality: N/A;   ESOPHAGUS SURGERY     KNEE SURGERY Right    LIPOSUCTION WITH LIPOFILLING Right 06/10/2019   Procedure: right breast scar and capsule release with fat grafting;  Surgeon: Wallace Going, DO;  Location: [place];  Service: Plastics;  Laterality: Right;   PORT-A-CATH REMOVAL     PORTACATH PLACEMENT Right 01/17/2018   Procedure:  INSERTION PORT-A-CATH- RIGHT;  Surgeon: Robert Bellow, MD;  Location: [place] [facility];  Service: General;  Laterality: Right;   ROBOTIC ADRENALECTOMY Right 12/28/2016   Procedure: ROBOTIC ADRENALECTOMY;  Surgeon: Hollice Espy, MD;  Location: [facility];  Service: Urology;  Laterality: Right;   SKIN CANCER EXCISION     THROAT SURGERY  1998   throat cancer    TUBAL LIGATION     VENTRAL HERNIA REPAIR N/A 03/03/2017   Procedure: HERNIA REPAIR VENTRAL ADULT;  Surgeon: Clayburn Pert, MD;  Location: [facility];  Service: General;  Laterality: N/A;    Social History   Socioeconomic History   Marital status: Married    Spouse name: Not on file   Number of children: 4   Years of education: Not on file   Highest education level: 10th grade  Occupational History   Occupation: retired  Tobacco Use   Smoking status: Former    Packs/day: 1.50    Years: 30.00    Total pack years: 45.00    Types: Cigarettes    Quit date: 05/03/1995    Years since quitting: 26.8   Smokeless tobacco: Never  Vaping Use   Vaping Use: Never used  Substance and  Sexual Activity   Alcohol use: Not Currently    Alcohol/week: 4.0 standard drinks of alcohol    Types: 4 Shots of liquor per week    Comment: none   Drug use: No   Sexual activity: Not Currently    Birth control/protection: Post-menopausal  Other Topics Concern   Not on file  Social History Narrative   Not on file   Social Determinants of Health   Financial Resource Strain: Low Risk  (12/02/2021)   Overall Financial Resource Strain (CARDIA)    Difficulty of Paying Living Expenses: Not very hard  Food Insecurity: No Food Insecurity (12/02/2021)   Hunger Vital Sign    Worried About Running Out of Food in the Last Year: Never true    Ran Out of Food in the Last Year: Never true  Transportation Needs: No Transportation Needs (12/02/2021)   PRAPARE - Hydrologist (Medical): No    Lack of Transportation (Non-Medical): No   Physical Activity: Insufficiently Active (12/02/2021)   Exercise Vital Sign    Days of Exercise per Week: 3 days    Minutes of Exercise per Session: 30 min  Stress: Stress Concern Present (12/02/2021)   [place]    Feeling of Stress : To some extent  Social Connections: Moderately Isolated (12/02/2021)   Social Connection and Isolation Panel [NHANES]    Frequency of Communication with Friends and Family: Three times a week    Frequency of Social Gatherings with Friends and Family: Twice a week    Attends Religious Services: Never    Marine scientist or Organizations: No    Attends Archivist Meetings: Never    Marital Status: Married  Human resources officer Violence: Not At Risk (12/02/2021)   Humiliation, Afraid, Rape, and Kick questionnaire    Fear of Current or Ex-Partner: No    Emotionally Abused: No    Physically Abused: No    Sexually Abused: No    Family History  Problem Relation Age of Onset   Diabetes Mother    Heart attack Mother    Cancer Sister        cancer of eyes, spread to lung, other places. died at 88   Cancer Sister 17       unknown primary, spread to bone, brain died in 45's   Cancer Paternal Aunt        unk type   Cancer Paternal Grandmother        type unk   Prostate cancer Neg Hx    Kidney cancer Neg Hx    Bladder Cancer Neg Hx    Breast cancer Neg Hx      Current Outpatient Medications:    albuterol (VENTOLIN HFA) 108 (90 Base) MCG/ACT inhaler, Inhale 2 puffs into the lungs every 6 (six) hours as needed for shortness of breath., Disp: 6.7 g, Rfl: 0   alum & mag hydroxide-simeth (MAALOX PLUS) 400-400-40 MG/5ML suspension, Take 15 mLs by mouth every 6 (six) hours as needed for indigestion., Disp: 355 mL, Rfl: 0   aspirin EC 81 MG tablet, Take 1 tablet (81 mg total) by mouth daily. Swallow whole., Disp: 90 tablet, Rfl: 3   brompheniramine-pseudoephedrine-DM 30-2-10 MG/5ML syrup,  Take 5 mLs by mouth 4 (four) times daily as needed., Disp: 120 mL, Rfl: 0   Budeson-Glycopyrrol-Formoterol (BREZTRI AEROSPHERE) 160-9-4.8 MCG/ACT AERO, Inhale 2 puffs into the lungs in the morning and at bedtime., Disp: 5.9  g, Rfl: 5   buPROPion (WELLBUTRIN) 75 MG tablet, Take 75 mg by mouth 2 (two) times daily., Disp: , Rfl:    busPIRone (BUSPAR) 5 MG tablet, Take 1 tablet (5 mg total) by mouth 2 (two) times daily., Disp: 180 tablet, Rfl: 3   cyclobenzaprine (FLEXERIL) 5 MG tablet, TAKE 1 TABLET BY MOUTH AT BEDTIME, Disp: 30 tablet, Rfl: 0   DULoxetine (CYMBALTA) 60 MG capsule, Take 1 capsule (60 mg total) by mouth daily., Disp: 90 capsule, Rfl: 3   fluticasone (FLONASE) 50 MCG/ACT nasal spray, Place 1 spray into both nostrils at bedtime., Disp: 16 g, Rfl: 6   gabapentin (NEURONTIN) 300 MG capsule, TAKE 1 CAPSULE BY MOUTH 3 TIMES DAILY, Disp: 60 capsule, Rfl: 5   meloxicam (MOBIC) 7.5 MG tablet, Take 7.5 mg by mouth daily., Disp: , Rfl:    mirabegron ER (MYRBETRIQ) 25 MG TB24 tablet, Take 1 tablet (25 mg total) by mouth daily., Disp: 28 tablet, Rfl: 0   montelukast (SINGULAIR) 10 MG tablet, Take 1 tablet (10 mg total) by mouth at bedtime., Disp: 90 tablet, Rfl: 3   omeprazole (PRILOSEC) 40 MG capsule, Take 1 capsule (40 mg total) by mouth daily., Disp: 90 capsule, Rfl: 0   rOPINIRole (REQUIP) 0.25 MG tablet, Take 1 tablet (0.25 mg total) by mouth at bedtime., Disp: 90 tablet, Rfl: 1   rosuvastatin (CRESTOR) 40 MG tablet, Take 1 tablet (40 mg total) by mouth daily., Disp: 90 tablet, Rfl: 3   sucralfate (CARAFATE) 1 g tablet, Take 1 tablet (1 g total) by mouth 4 (four) times daily -  with meals and at bedtime., Disp: 120 tablet, Rfl: 0   benzonatate (TESSALON) 100 MG capsule, Take 2 capsules (200 mg total) by mouth 3 (three) times daily. (Patient not taking: Reported on 03/16/2022), Disp: 180 capsule, Rfl: 0   guaiFENesin-codeine 100-10 MG/5ML syrup, Take 10 mLs by mouth at bedtime and may repeat dose  one time if needed. (Patient not taking: Reported on 03/16/2022), Disp: 120 mL, Rfl: 0   guaiFENesin-dextromethorphan (ROBITUSSIN DM) 100-10 MG/5ML syrup, Take 5 mLs by mouth every 4 (four) hours as needed for cough. (Patient not taking: Reported on 03/16/2022), Disp: 118 mL, Rfl: 0   nitrofurantoin, macrocrystal-monohydrate, (MACROBID) 100 MG capsule, Take 1 capsule (100 mg total) by mouth every 12 (twelve) hours., Disp: 14 capsule, Rfl: 0 No current facility-administered medications for this visit.  Facility-Administered Medications Ordered in Other Visits:    0.9 %  sodium chloride infusion, , Intravenous, Once PRN, Sindy Guadeloupe, MD   albuterol (PROVENTIL) (2.5 MG/3ML) 0.083% nebulizer solution 2.5 mg, 2.5 mg, Nebulization, Once PRN, Sindy Guadeloupe, MD   cyanocobalamin ((VITAMIN B-12)) injection 1,000 mcg, 1,000 mcg, Intramuscular, Q30 days, Sindy Guadeloupe, MD, 1,000 mcg at 08/27/21 1537   cyanocobalamin (VITAMIN B12) injection 1,000 mcg, 1,000 mcg, Intramuscular, Q30 days, Sindy Guadeloupe, MD, 1,000 mcg at 03/16/22 1101  Physical exam:  Vitals:   03/16/22 1011  BP: (!) 162/85  Pulse: 88  Resp: 16  Temp: (!) 97.5 F (36.4 C)  SpO2: 98%  Weight: 150 lb 3.2 oz (68.1 kg)   Physical Exam Constitutional:      General: She is not in acute distress. Cardiovascular:     Rate and Rhythm: Normal rate and regular rhythm.     Heart sounds: Normal heart sounds.  Pulmonary:     Effort: Pulmonary effort is normal.     Breath sounds: Normal breath sounds.  Abdominal:  General: Bowel sounds are normal.     Palpations: Abdomen is soft.  Skin:    General: Skin is warm and dry.  Neurological:     Mental Status: She is alert and oriented to person, place, and time.  Breast exam: Patient is s/p right lumpectomy with significant scarring and induration at the undersurface of the right breast.  No palpable masses in either breast.  No palpable bilateral axillary adenopathy.     Latest Ref Rng  & Units 03/16/2022    9:42 AM  CMP  Glucose 70 - 99 mg/dL 95   BUN 8 - 23 mg/dL 17   Creatinine 0.44 - 1.00 mg/dL 0.73   Sodium 135 - 145 mmol/L 137   Potassium 3.5 - 5.1 mmol/L 4.8   Chloride 98 - 111 mmol/L 100   CO2 22 - 32 mmol/L 29   Calcium 8.9 - 10.3 mg/dL 9.1   Total Protein 6.5 - 8.1 g/dL 6.6   Total Bilirubin 0.3 - 1.2 mg/dL 0.6   Alkaline Phos 38 - 126 U/L 59   AST 15 - 41 U/L 22   ALT 0 - 44 U/L 17       Latest Ref Rng & Units 03/16/2022    9:42 AM  CBC  WBC 4.0 - 10.5 K/uL 4.6   Hemoglobin 12.0 - 15.0 g/dL 13.8   Hematocrit 36.0 - 46.0 % 42.9   Platelets 150 - 400 K/uL 179     No images are attached to the encounter.  MM 3D SCREEN BREAST BILATERAL  Result Date: 03/09/2022 CLINICAL DATA:  Screening. EXAM: DIGITAL SCREENING BILATERAL MAMMOGRAM WITH TOMOSYNTHESIS AND CAD TECHNIQUE: Bilateral screening digital craniocaudal and mediolateral oblique mammograms were obtained. Bilateral screening digital breast tomosynthesis was performed. The images were evaluated with computer-aided detection. COMPARISON:  Previous exam(s). ACR Breast Density Category a: The breast tissue is almost entirely fatty. FINDINGS: There are no findings suspicious for malignancy. IMPRESSION: No mammographic evidence of malignancy. A result letter of this screening mammogram will be mailed directly to the patient. RECOMMENDATION: Screening mammogram in one year. (Code:[id number]-B-01Y) BI-RADS CATEGORY  1: Negative. Electronically Signed   By: Everlean Alstrom M.D.   On: 03/09/2022 08:33     Assessment and plan- Patient is a 66 y.o. female who is here for follow-up of following issues:  History of right breast cancer s/p adjuvant chemotherapy in 2019: Recent mammogram from November 2023 was unremarkable.  On clinical exam there are no palpable breast masses.  I suspect pain in the area of the right breast is secondary to chronic scarring.  She probably also has a component of fibromyalgia.  I will see her  back in 6 months  Anemia: Patient is not presently anemic.  Iron studies are normal and B12 levels are pending.  She will receive her B12 injection today and then continue to take oral B12.  We will decide if she needs to continue B12 injections based on her levels today.  Lung nodule: Patient was noted to have a 11 mm left upper lobe nodule on her recent MRI abdomen.  I will get a repeat CT chest without contrast sometime in April 2023 to follow-up on this   Visit Diagnosis 1. Malignant neoplasm of lower-outer quadrant of right breast of female, estrogen receptor negative ([place])      Dr. Randa Evens, MD, MPH [facility] [phone] 03/16/2022 1:26 PM

## 2022-03-16 NOTE — Progress Notes (Signed)
Pt states she has noticed an increase in tingling on her rt arm.

## 2022-03-17 ENCOUNTER — Ambulatory Visit: Payer: Medicare HMO | Admitting: Family Medicine

## 2022-03-17 ENCOUNTER — Encounter: Payer: Self-pay | Admitting: *Deleted

## 2022-03-17 ENCOUNTER — Telehealth: Payer: Self-pay | Admitting: Oncology

## 2022-03-17 ENCOUNTER — Ambulatory Visit (INDEPENDENT_AMBULATORY_CARE_PROVIDER_SITE_OTHER): Payer: Medicare HMO | Admitting: Family Medicine

## 2022-03-17 ENCOUNTER — Encounter: Payer: Self-pay | Admitting: Family Medicine

## 2022-03-17 VITALS — BP 121/73 | HR 89 | Temp 98.2°F | Resp 16 | Wt 147.0 lb

## 2022-03-17 DIAGNOSIS — G894 Chronic pain syndrome: Secondary | ICD-10-CM | POA: Diagnosis not present

## 2022-03-17 DIAGNOSIS — J411 Mucopurulent chronic bronchitis: Secondary | ICD-10-CM | POA: Diagnosis not present

## 2022-03-17 MED ORDER — BENZONATATE 100 MG PO CAPS: 200.0000 mg | ORAL_CAPSULE | Freq: Three times a day (TID) | ORAL | 0 refills | Status: DC

## 2022-03-17 MED ORDER — CYCLOBENZAPRINE HCL 5 MG PO TABS: 5.0000 mg | ORAL_TABLET | Freq: Every day | ORAL | 0 refills | Status: DC

## 2022-03-17 NOTE — Progress Notes (Signed)
I,April Miller,acting as a scribe for [name], FNP.,have documented all relevant documentation on the behalf of [name], FNP,as directed by  [name], FNP while in the presence of [name], FNP.   Established patient visit   Patient: Denise Watts   DOB: 01/01/1956   66 y.o. Female  MRN: [id number] Visit Date: 03/17/2022  Today's healthcare provider: [name], FNP  Re Introduced to nurse practitioner role and practice setting.  All questions answered.  Discussed provider/patient relationship and expectations.   Chief Complaint  Patient presents with   Follow-up   Subjective    HPI  Patient is needing reills on Tessalon and cyclobenzaprine.   Medications: Outpatient Medications Prior to Visit  Medication Sig   albuterol (VENTOLIN HFA) 108 (90 Base) MCG/ACT inhaler Inhale 2 puffs into the lungs every 6 (six) hours as needed for shortness of breath.   alum & mag hydroxide-simeth (MAALOX PLUS) 400-400-40 MG/5ML suspension Take 15 mLs by mouth every 6 (six) hours as needed for indigestion.   aspirin EC 81 MG tablet Take 1 tablet (81 mg total) by mouth daily. Swallow whole.   brompheniramine-pseudoephedrine-DM 30-2-10 MG/5ML syrup Take 5 mLs by mouth 4 (four) times daily as needed.   Budeson-Glycopyrrol-Formoterol (BREZTRI AEROSPHERE) 160-9-4.8 MCG/ACT AERO Inhale 2 puffs into the lungs in the morning and at bedtime.   buPROPion (WELLBUTRIN) 75 MG tablet Take 75 mg by mouth 2 (two) times daily.   busPIRone (BUSPAR) 5 MG tablet Take 1 tablet (5 mg total) by mouth 2 (two) times daily.   DULoxetine (CYMBALTA) 60 MG capsule Take 1 capsule (60 mg total) by mouth daily.   fluticasone (FLONASE) 50 MCG/ACT nasal spray Place 1 spray into both nostrils at bedtime.   gabapentin (NEURONTIN) 300 MG capsule TAKE 1 CAPSULE BY MOUTH 3 TIMES DAILY   meloxicam (MOBIC) 7.5 MG tablet Take 7.5 mg by mouth daily.   mirabegron ER (MYRBETRIQ) 25 MG TB24 tablet Take 1 tablet (25  mg total) by mouth daily.   montelukast (SINGULAIR) 10 MG tablet Take 1 tablet (10 mg total) by mouth at bedtime.   omeprazole (PRILOSEC) 40 MG capsule Take 1 capsule (40 mg total) by mouth daily.   rOPINIRole (REQUIP) 0.25 MG tablet Take 1 tablet (0.25 mg total) by mouth at bedtime.   rosuvastatin (CRESTOR) 40 MG tablet Take 1 tablet (40 mg total) by mouth daily.   sucralfate (CARAFATE) 1 g tablet Take 1 tablet (1 g total) by mouth 4 (four) times daily -  with meals and at bedtime.   [DISCONTINUED] cyclobenzaprine (FLEXERIL) 5 MG tablet TAKE 1 TABLET BY MOUTH AT BEDTIME   [DISCONTINUED] benzonatate (TESSALON) 100 MG capsule Take 2 capsules (200 mg total) by mouth 3 (three) times daily. (Patient not taking: Reported on 03/16/2022)   [DISCONTINUED] guaiFENesin-codeine 100-10 MG/5ML syrup Take 10 mLs by mouth at bedtime and may repeat dose one time if needed. (Patient not taking: Reported on 03/16/2022)   [DISCONTINUED] guaiFENesin-dextromethorphan (ROBITUSSIN DM) 100-10 MG/5ML syrup Take 5 mLs by mouth every 4 (four) hours as needed for cough. (Patient not taking: Reported on 03/16/2022)   [DISCONTINUED] nitrofurantoin, macrocrystal-monohydrate, (MACROBID) 100 MG capsule Take 1 capsule (100 mg total) by mouth every 12 (twelve) hours.   Facility-Administered Medications Prior to Visit  Medication Dose Route Frequency Provider   0.9 %  sodium chloride infusion   Intravenous Once PRN Sindy Guadeloupe, MD   albuterol (PROVENTIL) (2.5 MG/3ML) 0.083% nebulizer  solution 2.5 mg  2.5 mg Nebulization Once PRN Sindy Guadeloupe, MD   cyanocobalamin ((VITAMIN B-12)) injection 1,000 mcg  1,000 mcg Intramuscular Q30 days Sindy Guadeloupe, MD   cyanocobalamin (VITAMIN B12) injection 1,000 mcg  1,000 mcg Intramuscular Q30 days Sindy Guadeloupe, MD    Review of Systems    Objective    BP 121/73 (BP Location: Left Arm, Patient Position: Sitting, Cuff Size: Normal)   Pulse 89   Temp 98.2 F (36.8 C) (Temporal)   Resp  16   Wt 147 lb (66.7 kg)   SpO2 100%   BMI 27.78 kg/m   Physical Exam Vitals and nursing note reviewed.  Constitutional:      General: She is not in acute distress.    Appearance: Normal appearance. She is overweight. She is not ill-appearing, toxic-appearing or diaphoretic.  HENT:     Head: Normocephalic and atraumatic.  Cardiovascular:     Rate and Rhythm: Normal rate and regular rhythm.     Pulses: Normal pulses.     Heart sounds: Normal heart sounds. No murmur heard.    No friction rub. No gallop.  Pulmonary:     Effort: Pulmonary effort is normal. No respiratory distress.     Breath sounds: Normal breath sounds. No stridor. No wheezing, rhonchi or rales.  Chest:     Chest wall: No tenderness.  Musculoskeletal:        General: No swelling, tenderness, deformity or signs of injury. Normal range of motion.     Right lower leg: No edema.     Left lower leg: No edema.  Skin:    General: Skin is warm and dry.     Capillary Refill: Capillary refill takes less than 2 seconds.     Coloration: Skin is not jaundiced or pale.     Findings: No bruising, erythema, lesion or rash.  Neurological:     General: No focal deficit present.     Mental Status: She is alert and oriented to person, place, and time. Mental status is at baseline.     Cranial Nerves: No cranial nerve deficit.     Sensory: No sensory deficit.     Motor: No weakness.     Coordination: Coordination normal.  Psychiatric:        Mood and Affect: Mood normal.        Behavior: Behavior normal.        Thought Content: Thought content normal.        Judgment: Judgment normal.     No results found for any visits on 03/17/22.  Assessment & Plan     Problem List Items Addressed This Visit       Respiratory   Mucopurulent chronic bronchitis (HCC)    Chronic, stable Request for medication refills       Relevant Medications   benzonatate (TESSALON) 100 MG capsule     Other   Chronic pain syndrome - Primary  (Chronic)    Chronic, stable Request for flexeril refill; notes taking medication against medical advice- BID; however, also endorses fall history. Will not refill at higher dose given risk vs benefit analysis; pt voices understanding       Relevant Medications   cyclobenzaprine (FLEXERIL) 5 MG tablet   Return in about 3 months (around 06/17/2022) for chonic disease management.     Vonna Kotyk, FNP, have reviewed all documentation for this visit. The documentation on 03/17/22 for the exam, diagnosis, procedures, and orders  are all accurate and complete.  [name], [place] [phone] (phone) [phone] (fax)  [facility]

## 2022-03-17 NOTE — Telephone Encounter (Signed)
Left Vm with patient to let her know Dr. Janese Banks would like her to have monthly B12 injections instead of the oral supplements. I have her scheduled for the next 5 months (she has a follow-up in May) and left appointment info details.

## 2022-03-17 NOTE — Assessment & Plan Note (Signed)
Chronic, stable Request for flexeril refill; notes taking medication against medical advice- BID; however, also endorses fall history. Will not refill at higher dose given risk vs benefit analysis; pt voices understanding

## 2022-03-17 NOTE — Assessment & Plan Note (Signed)
Chronic, stable Request for medication refills

## 2022-03-29 ENCOUNTER — Ambulatory Visit: Payer: Medicare HMO | Admitting: Gastroenterology

## 2022-03-29 ENCOUNTER — Ambulatory Visit: Payer: Self-pay | Admitting: *Deleted

## 2022-03-29 DIAGNOSIS — J029 Acute pharyngitis, unspecified: Secondary | ICD-10-CM | POA: Diagnosis not present

## 2022-03-29 DIAGNOSIS — J441 Chronic obstructive pulmonary disease with (acute) exacerbation: Secondary | ICD-10-CM | POA: Diagnosis not present

## 2022-03-29 DIAGNOSIS — Z03818 Encounter for observation for suspected exposure to other biological agents ruled out: Secondary | ICD-10-CM | POA: Diagnosis not present

## 2022-03-29 NOTE — Telephone Encounter (Signed)
  Chief Complaint: shortness of breath, sore throat, tight cough, chest tightness, wheezing for 4-5 days now.   Thought she would get better.   Has home O2 she can use as needed.  Not using it so I encouraged her to use it.   Does not have a pulse oximeter. Symptoms: See above Frequency: For at least the last 4-5 days.  Was having congestion prior to coming down really sick. Pertinent Negatives: Patient denies N/A Disposition: '[]'$ ED /'[x]'$ Urgent Care (no appt availability in office) / '[]'$ Appointment(In office/virtual)/ '[]'$  [facility]/ '[]'$ Home Care/ '[]'$ Refused Recommended Disposition /'[]'$  Mobile Bus/ '[]'$  Follow-up with PCP Additional Notes: No appts at [facility].  She prefers to go to the urgent care at the hospital [facility] in [facility]).    She was agreeable to having her husband take her.

## 2022-03-29 NOTE — Telephone Encounter (Signed)
Reason for Disposition  [1] MILD difficulty breathing (e.g., minimal/no SOB at rest, SOB with walking, pulse <100) AND [2] still present when not coughing  Answer Assessment - Initial Assessment Questions 1. ONSET: "When did the cough begin?"      I'm coughing with congestion and nasal congestion.  Runny nose, sore throat, I feel a little warm   I get hot and cold.  It started 4-5 days ago that I got worse.   I've been sick for so long.  I've been on antibiotics.   2. SEVERITY: "How bad is the cough today?"      Bad   The chest feels tight and I'm not coughing up much. 3. SPUTUM: "Describe the color of your sputum" (none, dry cough; clear, white, yellow, green)     Green to clear mucus when I cough up 4. HEMOPTYSIS: "Are you coughing up any blood?" If so ask: "How much?" (flecks, streaks, tablespoons, etc.)     Not asked 5. DIFFICULTY BREATHING: "Are you having difficulty breathing?" If Yes, ask: "How bad is it?" (e.g., mild, moderate, severe)    - MILD: No SOB at rest, mild SOB with walking, speaks normally in sentences, can lie down, no retractions, pulse < 100.    - MODERATE: SOB at rest, SOB with minimal exertion and prefers to sit, cannot lie down flat, speaks in phrases, mild retractions, audible wheezing, pulse 100-120.    - SEVERE: Very SOB at rest, speaks in single words, struggling to breathe, sitting hunched forward, retractions, pulse > 120      I'm shortness of breath when I'm moving around 6. FEVER: "Do you have a fever?" If Yes, ask: "What is your temperature, how was it measured, and when did it start?"     Not sure 7. CARDIAC HISTORY: "Do you have any history of heart disease?" (e.g., heart attack, congestive heart failure)      no 8. LUNG HISTORY: "Do you have any history of lung disease?"  (e.g., pulmonary embolus, asthma, emphysema)     no 9. PE RISK FACTORS: "Do you have a history of blood clots?" (or: recent major surgery, recent prolonged travel, bedridden)     Not  asked 10. OTHER SYMPTOMS: "Do you have any other symptoms?" (e.g., runny nose, wheezing, chest pain)       See above 11. PREGNANCY: "Is there any chance you are pregnant?" "When was your last menstrual period?"       N/A due to age 66. TRAVEL: "Have you traveled out of the country in the last month?" (e.g., travel history, exposures)       Not asked  Protocols used: Cough - Acute Productive-A-AH

## 2022-03-31 DIAGNOSIS — J441 Chronic obstructive pulmonary disease with (acute) exacerbation: Secondary | ICD-10-CM | POA: Diagnosis not present

## 2022-04-04 ENCOUNTER — Institutional Professional Consult (permissible substitution): Payer: Medicare HMO | Admitting: Student in an Organized Health Care Education/Training Program

## 2022-04-04 ENCOUNTER — Ambulatory Visit: Payer: Medicare HMO | Admitting: Surgery

## 2022-04-06 ENCOUNTER — Ambulatory Visit: Payer: Medicare HMO | Admitting: Gastroenterology

## 2022-04-06 ENCOUNTER — Telehealth: Payer: Self-pay | Admitting: Gastroenterology

## 2022-04-06 NOTE — Progress Notes (Deleted)
Primary Care Physician: [name], FNP  Primary Gastroenterologist:  Dr. Lucilla Lame  No chief complaint on file.   HPI: Denise Watts is a 66 y.o. female here with a history of dysphagia with esophageal dilation.  The patient also had a barium swallow done by me previously that showed a single episode of aspiration.  The patient was recommended to go through a modified barium swallow but was taking care of a sick husband at that time.  The patient recently followed up with surgery for a recurrent ventral hernia and had imaging that showed:  IMPRESSION: Pharyngeal retention.  No aspiration. No mass or stricture.  Mid esophageal fold thickening versus abnormal mucosa. Evaluation limited on this single contrast study. Esophageal dysmotility. Mild inducible gastroesophageal reflux.  The patient also has a history of breast and vocal cord cancer.  She had a recent CT scan that showed a adrenal lesion that was suspected as being either a metastatic or primary neoplasm.  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 6.7 g 0   alum & mag hydroxide-simeth (MAALOX PLUS) 400-400-40 MG/5ML suspension Take 15 mLs by mouth every 6 (six) hours as needed for  indigestion. 355 mL 0   aspirin EC 81 MG tablet Take 1 tablet (81 mg total) by mouth daily. Swallow whole. 90 tablet 3   benzonatate (TESSALON) 100 MG capsule Take 2 capsules (200 mg total) by mouth 3 (three) times daily. 180 capsule 0   brompheniramine-pseudoephedrine-DM 30-2-10 MG/5ML syrup Take 5 mLs by mouth 4 (four) times daily as needed. 120 mL 0   Budeson-Glycopyrrol-Formoterol (BREZTRI AEROSPHERE) 160-9-4.8 MCG/ACT AERO Inhale 2 puffs into the lungs in the morning and at bedtime. 5.9 g 5   buPROPion (WELLBUTRIN) 75 MG tablet Take 75 mg by mouth 2 (two) times daily.     busPIRone (BUSPAR) 5 MG tablet Take 1 tablet (5 mg total) by mouth 2 (two) times daily. 180 tablet 3   cyclobenzaprine (FLEXERIL) 5 MG tablet Take 1 tablet (5 mg total) by mouth at bedtime. 30 tablet 0   DULoxetine (CYMBALTA) 60 MG capsule Take 1 capsule (60 mg total) by mouth daily. 90 capsule 3   fluticasone (FLONASE) 50 MCG/ACT nasal spray Place 1 spray into both nostrils at bedtime. 16 g 6   gabapentin (NEURONTIN) 300 MG capsule TAKE 1 CAPSULE BY MOUTH 3 TIMES DAILY 60 capsule 5   meloxicam (MOBIC) 7.5 MG tablet Take 7.5 mg by mouth daily.     mirabegron ER (MYRBETRIQ) 25 MG TB24 tablet Take 1 tablet (25 mg total) by mouth daily. 28 tablet 0   montelukast (SINGULAIR)  10 MG tablet Take 1 tablet (10 mg total) by mouth at bedtime. 90 tablet 3   omeprazole (PRILOSEC) 40 MG capsule Take 1 capsule (40 mg total) by mouth daily. 90 capsule 0   rOPINIRole (REQUIP) 0.25 MG tablet Take 1 tablet (0.25 mg total) by mouth at bedtime. 90 tablet 1   rosuvastatin (CRESTOR) 40 MG tablet Take 1 tablet (40 mg total) by mouth daily. 90 tablet 3   sucralfate (CARAFATE) 1 g tablet Take 1 tablet (1 g total) by mouth 4 (four) times daily -  with meals and at bedtime. 120 tablet 0   No current facility-administered medications for this visit.   Facility-Administered Medications Ordered in Other Visits  Medication Dose Route Frequency Provider  Last Rate Last Admin   0.9 %  sodium chloride infusion   Intravenous Once PRN Sindy Guadeloupe, MD       albuterol (PROVENTIL) (2.5 MG/3ML) 0.083% nebulizer solution 2.5 mg  2.5 mg Nebulization Once PRN Sindy Guadeloupe, MD       cyanocobalamin ((VITAMIN B-12)) injection 1,000 mcg  1,000 mcg Intramuscular Q30 days Sindy Guadeloupe, MD   1,000 mcg at 08/27/21 1537   cyanocobalamin (VITAMIN B12) injection 1,000 mcg  1,000 mcg Intramuscular Q30 days Sindy Guadeloupe, MD   1,000 mcg at 03/16/22 1101    Allergies as of 04/06/2022 - Review Complete 03/17/2022  Allergen Reaction Noted   Sulfa antibiotics Other (See Comments) 10/28/2020    ROS:  General: Negative for anorexia, weight loss, fever, chills, fatigue, weakness. ENT: Negative for hoarseness, difficulty swallowing , nasal congestion. CV: Negative for chest pain, angina, palpitations, dyspnea on exertion, peripheral edema.  Respiratory: Negative for dyspnea at rest, dyspnea on exertion, cough, sputum, wheezing.  GI: See history of present illness. GU:  Negative for dysuria, hematuria, urinary incontinence, urinary frequency, nocturnal urination.  Endo: Negative for unusual weight change.    Physical Examination:   There were no vitals taken for this visit.  General: Well-nourished, well-developed in no acute distress.  Eyes: No icterus. Conjunctivae pink. Lungs: Clear to auscultation bilaterally. Non-labored. Heart: Regular rate and rhythm, no murmurs rubs or gallops.  Abdomen: Bowel sounds are normal, nontender, nondistended, no hepatosplenomegaly or masses, no abdominal bruits or hernia , no rebound or guarding.   Extremities: No lower extremity edema. No clubbing or deformities. Neuro: Alert and oriented x 3.  Grossly intact. Skin: Warm and dry, no jaundice.   Psych: Alert and cooperative, normal mood and affect.  Labs:    Imaging Studies: MM 3D SCREEN BREAST BILATERAL  Result Date: 03/09/2022 CLINICAL DATA:  Screening. EXAM:  DIGITAL SCREENING BILATERAL MAMMOGRAM WITH TOMOSYNTHESIS AND CAD TECHNIQUE: Bilateral screening digital craniocaudal and mediolateral oblique mammograms were obtained. Bilateral screening digital breast tomosynthesis was performed. The images were evaluated with computer-aided detection. COMPARISON:  Previous exam(s). ACR Breast Density Category a: The breast tissue is almost entirely fatty. FINDINGS: There are no findings suspicious for malignancy. IMPRESSION: No mammographic evidence of malignancy. A result letter of this screening mammogram will be mailed directly to the patient. RECOMMENDATION: Screening mammogram in one year. (Code:SM-B-01Y) BI-RADS CATEGORY  1: Negative. Electronically Signed   By: Everlean Alstrom M.D.   On: 03/09/2022 08:33    Assessment and Plan:   BEKAH IGOE is a 66 y.o. y/o female ***     Lucilla Lame, MD. Mar[facility] Regal    Note: This dictation was prepared with Dragon dictation along with smaller phrase technology. Any transcriptional errors that result  from this process are unintentional.

## 2022-04-06 NOTE — Telephone Encounter (Signed)
Per pt  pone call she messed up not realizing her appt time was 9:30 today.  Pt states per Dr. Deboraha Sprang need to see Dr, Allen Norris ASAP due to the fact she is waiting to have Hernia surgery .

## 2022-04-08 NOTE — Telephone Encounter (Signed)
I have called pt x 2 in 24 hours with no return call

## 2022-04-11 ENCOUNTER — Telehealth: Payer: Self-pay

## 2022-04-11 ENCOUNTER — Other Ambulatory Visit: Payer: Self-pay

## 2022-04-11 NOTE — Telephone Encounter (Signed)
Fax sent to Dr Claudette Stapler  Pulmonology requesting for procedure clearance

## 2022-04-18 ENCOUNTER — Inpatient Hospital Stay: Payer: Medicare HMO | Attending: Oncology

## 2022-04-18 DIAGNOSIS — D519 Vitamin B12 deficiency anemia, unspecified: Secondary | ICD-10-CM | POA: Insufficient documentation

## 2022-04-18 DIAGNOSIS — E538 Deficiency of other specified B group vitamins: Secondary | ICD-10-CM

## 2022-04-18 DIAGNOSIS — Z23 Encounter for immunization: Secondary | ICD-10-CM

## 2022-04-18 MED ORDER — CYANOCOBALAMIN 1000 MCG/ML IJ SOLN
1000.0000 ug | INTRAMUSCULAR | Status: DC
  Administered 2022-04-18: 1000 ug via INTRAMUSCULAR
  Filled 2022-04-18: qty 1

## 2022-04-19 NOTE — Telephone Encounter (Signed)
Looks like pt has appt with Pulm 06/30/22

## 2022-04-22 ENCOUNTER — Ambulatory Visit: Payer: Self-pay

## 2022-04-22 ENCOUNTER — Encounter: Payer: Self-pay | Admitting: Cardiology

## 2022-04-22 ENCOUNTER — Other Ambulatory Visit: Payer: Self-pay | Admitting: Family Medicine

## 2022-04-22 ENCOUNTER — Ambulatory Visit: Payer: Medicare HMO | Attending: Cardiology | Admitting: Cardiology

## 2022-04-22 VITALS — BP 121/80 | HR 96 | Ht 61.0 in | Wt 149.8 lb

## 2022-04-22 DIAGNOSIS — R051 Acute cough: Secondary | ICD-10-CM | POA: Diagnosis not present

## 2022-04-22 DIAGNOSIS — E785 Hyperlipidemia, unspecified: Secondary | ICD-10-CM | POA: Diagnosis not present

## 2022-04-22 DIAGNOSIS — I2510 Atherosclerotic heart disease of native coronary artery without angina pectoris: Secondary | ICD-10-CM

## 2022-04-22 MED ORDER — PREDNISONE 10 MG PO TABS: ORAL_TABLET | ORAL | 0 refills | Status: DC

## 2022-04-22 MED ORDER — LEVOFLOXACIN 500 MG PO TABS: 500.0000 mg | ORAL_TABLET | Freq: Every day | ORAL | 0 refills | Status: AC

## 2022-04-22 NOTE — Patient Instructions (Signed)
Medication Instructions:  No changes *If you need a refill on your cardiac medications before your next appointment, please call your pharmacy*   Lab Work: None ordered If you have labs (blood work) drawn today and your tests are completely normal, you will receive your results only by: [place] (if you have MyChart) OR A paper copy in the mail If you have any lab test that is abnormal or we need to change your treatment, we will call you to review the results.   Testing/Procedures: None orderd   Follow-Up: At [facility], you and your health needs are our priority.  As part of our continuing mission to provide you with exceptional heart care, we have created designated Provider Care Teams.  These Care Teams include your primary Cardiologist (physician) and Advanced Practice Providers (APPs -  Physician Assistants and Nurse Practitioners) who all work together to provide you with the care you need, when you need it.  We recommend signing up for the patient portal called "MyChart".  Sign up information is provided on this After Visit Summary.  MyChart is used to connect with patients for Virtual Visits (Telemedicine).  Patients are able to view lab/test results, encounter notes, upcoming appointments, etc.  Non-urgent messages can be sent to your provider as well.   To learn more about what you can do with MyChart, go to [email or web address].    Your next appointment:   12 month(s)  The format for your next appointment:   In Person  Provider:   You may see Kate Sable, MD or one of the following Advanced Practice Providers on your designated Care Team:   Murray Hodgkins, NP Christell Faith, PA-C Cadence Kathlen Mody, PA-C Gerrie Nordmann, NP     Important Information About Sugar

## 2022-04-22 NOTE — Progress Notes (Signed)
Cardiology Office Note:    Date:  04/22/2022   ID:  Denise Watts, DOB 1955/05/21, MRN [id number]  PCP:  [name], FNP   [facility] Providers Cardiologist:  Kate Sable, MD     Referring MD: [name], FNP   Chief Complaint  Patient presents with   Follow-up    3 month follow up. Patient states that she has a cough, sore throat, and shortness of breath. Meds reviewed with patient.     History of Present Illness:    Denise Watts is a 66 y.o. 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    States having a recent episode of cough, diagnosed with bronchitis, prescribed antibiotics by PCP.  Symptoms seem to persist despite taking antibiotics.  Denies chest pain, no new cardiac concerns at this time.  States she will wait a little bit.  Prior notes Coronary CT 11/2021, mild proximal LAD and RCA stenosis. Echo./2023 EF 55 to 60% She states having chest discomfort with exertion, also endorses shortness of breath.  Her mother passed from heart attack in her 36s.   Past Medical History:  Diagnosis Date   Anxiety    Arthritis    KNEE RIGHT   Breast cancer ([place]) 1997   right breast lumpectomy with radiation   Breast cancer ([place]) 11/15/2017   INVASIVE MAMMARY CARCINOMA/ triple negative   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[facility]) 11/10/2014   Cough 03/02/2017   INTERMTTENT DRY COUGH   Depression    GERD (gastroesophageal reflux disease)    History of hiatal hernia    Personal history of chemotherapy    Personal history of radiation therapy    Skin cancer    Throat cancer ([place]) 1998   radiation   Wears dentures    full upper and lower    Past Surgical History:  Procedure Laterality Date   BREAST BIOPSY Right 1997   positive   BREAST BIOPSY Right 11/15/2017   INVASIVE MAMMARY CARCINOMA triple negative   BREAST BIOPSY Right  07/20/2020   [place] bx, Q marker, neg   BREAST LUMPECTOMY Right 1997   2019 also   BREAST LUMPECTOMY WITH SENTINEL LYMPH NODE BIOPSY Right 12/08/2017   Procedure: BREAST LUMPECTOMY WITH SENTINEL LYMPH NODE BX;  Surgeon: Robert Bellow, MD;  Location: [facility];  Service: General;  Laterality: Right;   BREAST REDUCTION WITH MASTOPEXY Left 06/10/2019   Procedure: Left breast mastopexy reduction for symmetry;  Surgeon: Wallace Going, DO;  Location: [place];  Service: Plastics;  Laterality: Left;  total 2.5 hours   CATARACT EXTRACTION W/PHACO Right 02/07/2022   Procedure: CATARACT EXTRACTION PHACO AND INTRAOCULAR LENS PLACEMENT (IOC) RIGHT DIABETIC;  Surgeon: Eulogio Bear, MD;  Location: [facility];  Service: Ophthalmology;  Laterality: Right;  3.60 00:34.7   CATARACT EXTRACTION W/PHACO Left 02/28/2022   Procedure: CATARACT EXTRACTION PHACO AND INTRAOCULAR LENS PLACEMENT (IOC) LEFT DIABETIC 4.11 00:26.1;  Surgeon: Eulogio Bear, MD;  Location: [place];  Service: Ophthalmology;  Laterality: Left;   COLONOSCOPY  2015   COLONOSCOPY WITH PROPOFOL N/A 10/29/2019   Procedure: COLONOSCOPY WITH PROPOFOL;  Surgeon: Lucilla Lame, MD;  Location: [facility];  Service: Endoscopy;  Laterality: N/A;   ESOPHAGOGASTRODUODENOSCOPY (EGD) WITH PROPOFOL N/A 05/24/2019   Procedure: ESOPHAGOGASTRODUODENOSCOPY (EGD) WITH PROPOFOL;  Surgeon: Lucilla Lame, MD;  Location: [facility];  Service: Endoscopy;  Laterality: N/A;   ESOPHAGUS SURGERY     KNEE SURGERY Right    LIPOSUCTION WITH LIPOFILLING Right 06/10/2019   Procedure: right breast scar and capsule release with fat grafting;  Surgeon: Wallace Going, DO;  Location: [place];  Service: Plastics;  Laterality: Right;   PORT-A-CATH REMOVAL     PORTACATH PLACEMENT Right 01/17/2018   Procedure: INSERTION PORT-A-CATH- RIGHT;  Surgeon: Robert Bellow, MD;  Location: [place] [facility];  Service:  General;  Laterality: Right;   ROBOTIC ADRENALECTOMY Right 12/28/2016   Procedure: ROBOTIC ADRENALECTOMY;  Surgeon: Hollice Espy, MD;  Location: [facility];  Service: Urology;  Laterality: Right;   SKIN CANCER EXCISION     THROAT SURGERY  1998   throat cancer    TUBAL LIGATION     VENTRAL HERNIA REPAIR N/A 03/03/2017   Procedure: HERNIA REPAIR VENTRAL ADULT;  Surgeon: Clayburn Pert, MD;  Location: [facility];  Service: General;  Laterality: N/A;    Current Medications: Current Meds  Medication Sig   albuterol (VENTOLIN HFA) 108 (90 Base) MCG/ACT inhaler Inhale 2 puffs into the lungs every 6 (six) hours as needed for shortness of breath.   alum & mag hydroxide-simeth (MAALOX PLUS) 400-400-40 MG/5ML suspension Take 15 mLs by mouth every 6 (six) hours as needed for indigestion.   aspirin EC 81 MG tablet Take 1 tablet (81 mg total) by mouth daily. Swallow whole.   benzonatate (TESSALON) 100 MG capsule Take 2 capsules (200 mg total) by mouth 3 (three) times daily.   brompheniramine-pseudoephedrine-DM 30-2-10 MG/5ML syrup Take 5 mLs by mouth 4 (four) times daily as needed.   Budeson-Glycopyrrol-Formoterol (BREZTRI AEROSPHERE) 160-9-4.8 MCG/ACT AERO Inhale 2 puffs into the lungs in the morning and at bedtime.   buPROPion (WELLBUTRIN) 75 MG tablet Take 75 mg by mouth 2 (two) times daily.   busPIRone (BUSPAR) 5 MG tablet Take 1 tablet (5 mg total) by mouth 2 (two) times daily.   cyclobenzaprine (FLEXERIL) 5 MG tablet Take 1 tablet (5 mg total) by mouth at bedtime.   DULoxetine (CYMBALTA) 60 MG capsule Take 1 capsule (60 mg total) by mouth daily.   fluticasone (FLONASE) 50 MCG/ACT nasal spray Place 1 spray into both nostrils at bedtime.   gabapentin (NEURONTIN) 300 MG capsule TAKE 1 CAPSULE BY MOUTH 3 TIMES DAILY   meloxicam (MOBIC) 7.5 MG tablet Take 7.5 mg by mouth daily.   mirabegron ER (MYRBETRIQ) 25 MG TB24 tablet Take 1 tablet (25 mg total) by mouth daily.   montelukast (SINGULAIR) 10 MG  tablet Take 1 tablet (10 mg total) by mouth at bedtime.   omeprazole (PRILOSEC) 40 MG capsule Take 1 capsule (40 mg total) by mouth daily.   rOPINIRole (REQUIP) 0.25 MG tablet Take 1 tablet (0.25 mg total) by mouth at bedtime.   rosuvastatin (CRESTOR) 40 MG tablet Take 1 tablet (40 mg total) by mouth daily.   sucralfate (CARAFATE) 1 g tablet Take 1 tablet (1 g total) by mouth 4 (four) times daily -  with meals and at bedtime.     Allergies:   Sulfa antibiotics   Social History   Socioeconomic History   Marital status: Married    Spouse name: Not on file   Number of children: 4   Years of education: Not on file   Highest education level: 10th grade  Occupational History   Occupation: retired  Tobacco Use   Smoking status: Former    Packs/day: 1.50  Years: 30.00    Total pack years: 45.00    Types: Cigarettes    Quit date: 05/03/1995    Years since quitting: 26.9   Smokeless tobacco: Never  Vaping Use   Vaping Use: Never used  Substance and Sexual Activity   Alcohol use: Not Currently    Alcohol/week: 4.0 standard drinks of alcohol    Types: 4 Shots of liquor per week    Comment: none   Drug use: No   Sexual activity: Not Currently    Birth control/protection: Post-menopausal  Other Topics Concern   Not on file  Social History Narrative   Not on file   Social Determinants of Health   Financial Resource Strain: Low Risk  (12/02/2021)   Overall Financial Resource Strain (CARDIA)    Difficulty of Paying Living Expenses: Not very hard  Food Insecurity: No Food Insecurity (12/02/2021)   Hunger Vital Sign    Worried About Running Out of Food in the Last Year: Never true    Ran Out of Food in the Last Year: Never true  Transportation Needs: No Transportation Needs (12/02/2021)   PRAPARE - Hydrologist (Medical): No    Lack of Transportation (Non-Medical): No  Physical Activity: Insufficiently Active (12/02/2021)   Exercise Vital Sign    Days of  Exercise per Week: 3 days    Minutes of Exercise per Session: 30 min  Stress: Stress Concern Present (12/02/2021)   [place]    Feeling of Stress : To some extent  Social Connections: Moderately Isolated (12/02/2021)   Social Connection and Isolation Panel [NHANES]    Frequency of Communication with Friends and Family: Three times a week    Frequency of Social Gatherings with Friends and Family: Twice a week    Attends Religious Services: Never    Marine scientist or Organizations: No    Attends Music therapist: Never    Marital Status: Married     Family History: The patient's family history includes Cancer in her paternal aunt, paternal grandmother, and sister; Cancer (age of onset: 66) in her sister; Diabetes in her mother; Heart attack in her mother. There is no history of Prostate cancer, Kidney cancer, Bladder Cancer, or Breast cancer.  ROS:   Please see the history of present illness.     All other systems reviewed and are negative.  EKGs/Labs/Other Studies Reviewed:    The following studies were reviewed today:   EKG:  EKG not ordered today.   Recent Labs: 08/27/2021: NT-Pro BNP 60 11/03/2021: TSH 0.536 03/16/2022: ALT 17; BUN 17; Creatinine, Ser 0.73; Hemoglobin 13.8; Platelets 179; Potassium 4.8; Sodium 137  Recent Lipid Panel    Component Value Date/Time   CHOL 135 05/26/2021 1101   TRIG 115 05/26/2021 1101   HDL 46 05/26/2021 1101   CHOLHDL 2.9 05/26/2021 1101   LDLCALC 68 05/26/2021 1101     Risk Assessment/Calculations:          Physical Exam:    VS:  BP 121/80 (BP Location: Left Arm, Patient Position: Sitting, Cuff Size: Normal)   Pulse 96   Ht '5\' 1"'$  (1.549 m)   Wt 149 lb 12.8 oz (67.9 kg)   SpO2 96%   BMI 28.30 kg/m     Wt Readings from Last 3 Encounters:  04/22/22 149 lb 12.8 oz (67.9 kg)  03/17/22 147 lb (66.7 kg)  03/16/22 150 lb 3.2 oz (68.1  kg)      GEN:  Well nourished, well developed in no acute distress HEENT: Normal NECK: No JVD; No carotid bruits CARDIAC: RRR, no murmurs, rubs, gallops RESPIRATORY:  Clear to auscultation without wheezing. ABDOMEN: Soft, non-tender, non-distended MUSCULOSKELETAL:  No edema; No deformity  SKIN: Warm and dry NEUROLOGIC:  Alert and oriented x 3 PSYCHIATRIC:  Normal affect   ASSESSMENT:    1. Coronary artery disease involving native coronary artery of native heart without angina pectoris   2. Hyperlipidemia, unspecified hyperlipidemia type   3. Acute cough    PLAN:    In order of problems listed above:  Nonobstructive CAD, mild proximal LAD and RCA stenosis, calcium score 135.  Continue aspirin 81 mg, Crestor 40 mg daily.  EF 55 to 60%. Hyperlipidemia, LDL at goal.  Continue Crestor. Occasional cough on exam today, may need another course of antibiotics.  Plans to call PCPs office regarding management of COPD, possible bronchitis.  ED precautions given.  Follow-up in 12 months.  Medication Adjustments/Labs and Tests Ordered: Current medicines are reviewed at length with the patient today.  Concerns regarding medicines are outlined above.  No orders of the defined types were placed in this encounter.    No orders of the defined types were placed in this encounter.     Patient Instructions  Medication Instructions:  No changes *If you need a refill on your cardiac medications before your next appointment, please call your pharmacy*   Lab Work: None ordered If you have labs (blood work) drawn today and your tests are completely normal, you will receive your results only by: [place] (if you have MyChart) OR A paper copy in the mail If you have any lab test that is abnormal or we need to change your treatment, we will call you to review the results.   Testing/Procedures: None orderd   Follow-Up: At [facility], you and your health needs are our priority.   As part of our continuing mission to provide you with exceptional heart care, we have created designated Provider Care Teams.  These Care Teams include your primary Cardiologist (physician) and Advanced Practice Providers (APPs -  Physician Assistants and Nurse Practitioners) who all work together to provide you with the care you need, when you need it.  We recommend signing up for the patient portal called "MyChart".  Sign up information is provided on this After Visit Summary.  MyChart is used to connect with patients for Virtual Visits (Telemedicine).  Patients are able to view lab/test results, encounter notes, upcoming appointments, etc.  Non-urgent messages can be sent to your provider as well.   To learn more about what you can do with MyChart, go to [email or web address].    Your next appointment:   12 month(s)  The format for your next appointment:   In Person  Provider:   You may see Kate Sable, MD or one of the following Advanced Practice Providers on your designated Care Team:   Murray Hodgkins, NP Christell Faith, PA-C Cadence Kathlen Mody, PA-C Gerrie Nordmann, NP     Important Information About Sugar         Signed, Kate Sable, MD  04/22/2022 11:39 AM    [place]

## 2022-04-22 NOTE — Telephone Encounter (Signed)
    Chief Complaint: Cough, congestion, COPD. Seen in UC 03/29/22 for this. Asking for antibiotic to be called in.  Symptoms: Above Frequency: November Pertinent Negatives: Patient denies fever Disposition: '[]'$ ED /'[]'$ Urgent Care (no appt availability in office) / '[]'$ Appointment(In office/virtual)/ '[]'$  [facility] [facility]/ '[]'$ Home Care/ '[]'$ Refused Recommended Disposition /'[]'$ [place] [facility]/ '[x]'$  Follow-up with PCP Additional Notes: Please advise pt.  Answer Assessment - Initial Assessment Questions 1. ONSET: "When did the cough begin?"      November 2. SEVERITY: "How bad is the cough today?"      Severe 3. SPUTUM: "Describe the color of your sputum" (none, dry cough; clear, white, yellow, green)     Green 4. HEMOPTYSIS: "Are you coughing up any blood?" If so ask: "How much?" (flecks, streaks, tablespoons, etc.)     No 5. DIFFICULTY BREATHING: "Are you having difficulty breathing?" If Yes, ask: "How bad is it?" (e.g., mild, moderate, severe)    - MILD: No SOB at rest, mild SOB with walking, speaks normally in sentences, can lie down, no retractions, pulse < 100.    - MODERATE: SOB at rest, SOB with minimal exertion and prefers to sit, cannot lie down flat, speaks in phrases, mild retractions, audible wheezing, pulse 100-120.    - SEVERE: Very SOB at rest, speaks in single words, struggling to breathe, sitting hunched forward, retractions, pulse > 120      Mild 6. FEVER: "Do you have a fever?" If Yes, ask: "What is your temperature, how was it measured, and when did it start?"     No 7. CARDIAC HISTORY: "Do you have any history of heart disease?" (e.g., heart attack, congestive heart failure)      Yes 8. LUNG HISTORY: "Do you have any history of lung disease?"  (e.g., pulmonary embolus, asthma, emphysema)     COPD 9. PE RISK FACTORS: "Do you have a history of blood clots?" (or: recent major surgery, recent prolonged travel, bedridden)     nO 10. OTHER SYMPTOMS: "Do you have any other  symptoms?" (e.g., runny nose, wheezing, chest pain)       Wheezing 11. PREGNANCY: "Is there any chance you are pregnant?" "When was your last menstrual period?"       No 12. TRAVEL: "Have you traveled out of the country in the last month?" (e.g., travel history, exposures)       No  Protocols used: Cough - Acute Productive-A-AH

## 2022-04-28 ENCOUNTER — Other Ambulatory Visit: Payer: Self-pay | Admitting: Family Medicine

## 2022-04-28 DIAGNOSIS — G894 Chronic pain syndrome: Secondary | ICD-10-CM

## 2022-05-04 DIAGNOSIS — J449 Chronic obstructive pulmonary disease, unspecified: Secondary | ICD-10-CM | POA: Diagnosis not present

## 2022-05-04 DIAGNOSIS — U071 COVID-19: Secondary | ICD-10-CM | POA: Diagnosis not present

## 2022-05-06 ENCOUNTER — Encounter: Payer: Self-pay | Admitting: Oncology

## 2022-05-11 ENCOUNTER — Encounter: Payer: Self-pay | Admitting: Oncology

## 2022-05-13 ENCOUNTER — Ambulatory Visit
Admission: RE | Admit: 2022-05-13 | Discharge: 2022-05-13 | Disposition: A | Discharge status: Home / Self Care | Payer: Medicare PPO | Source: Ambulatory Visit | Attending: Oncology | Admitting: Oncology

## 2022-05-13 DIAGNOSIS — C50511 Malignant neoplasm of lower-outer quadrant of right female breast: Secondary | ICD-10-CM | POA: Diagnosis not present

## 2022-05-13 DIAGNOSIS — J439 Emphysema, unspecified: Secondary | ICD-10-CM | POA: Diagnosis not present

## 2022-05-13 DIAGNOSIS — Z171 Estrogen receptor negative status [ER-]: Secondary | ICD-10-CM | POA: Insufficient documentation

## 2022-05-13 DIAGNOSIS — R918 Other nonspecific abnormal finding of lung field: Secondary | ICD-10-CM | POA: Diagnosis not present

## 2022-05-19 ENCOUNTER — Inpatient Hospital Stay: Payer: Medicare PPO | Attending: Oncology

## 2022-05-19 ENCOUNTER — Encounter: Payer: Self-pay | Admitting: Family Medicine

## 2022-05-19 ENCOUNTER — Encounter: Payer: Self-pay | Admitting: Oncology

## 2022-05-19 DIAGNOSIS — D519 Vitamin B12 deficiency anemia, unspecified: Secondary | ICD-10-CM | POA: Diagnosis not present

## 2022-05-19 DIAGNOSIS — Z23 Encounter for immunization: Secondary | ICD-10-CM

## 2022-05-19 DIAGNOSIS — E538 Deficiency of other specified B group vitamins: Secondary | ICD-10-CM

## 2022-05-19 MED ORDER — CYANOCOBALAMIN 1000 MCG/ML IJ SOLN
1000.0000 ug | INTRAMUSCULAR | Status: DC
  Administered 2022-05-19: 1000 ug via INTRAMUSCULAR
  Filled 2022-05-19: qty 1

## 2022-05-20 ENCOUNTER — Encounter: Payer: Self-pay | Admitting: Family Medicine

## 2022-05-20 ENCOUNTER — Ambulatory Visit (INDEPENDENT_AMBULATORY_CARE_PROVIDER_SITE_OTHER): Payer: Medicare PPO | Admitting: Family Medicine

## 2022-05-20 VITALS — BP 110/76 | HR 109 | Temp 97.5°F | Wt 147.5 lb

## 2022-05-20 DIAGNOSIS — F411 Generalized anxiety disorder: Secondary | ICD-10-CM | POA: Insufficient documentation

## 2022-05-20 DIAGNOSIS — R918 Other nonspecific abnormal finding of lung field: Secondary | ICD-10-CM

## 2022-05-20 DIAGNOSIS — J157 Pneumonia due to Mycoplasma pneumoniae: Secondary | ICD-10-CM | POA: Diagnosis not present

## 2022-05-20 MED ORDER — PREDNISONE 10 MG PO TABS: ORAL_TABLET | ORAL | 0 refills | Status: DC

## 2022-05-20 MED ORDER — PSEUDOEPH-BROMPHEN-DM 30-2-10 MG/5ML PO SYRP: 5.0000 mL | ORAL_SOLUTION | Freq: Four times a day (QID) | ORAL | 0 refills | Status: DC | PRN

## 2022-05-20 MED ORDER — ALBUTEROL SULFATE (2.5 MG/3ML) 0.083% IN NEBU: 2.5000 mg | INHALATION_SOLUTION | Freq: Four times a day (QID) | RESPIRATORY_TRACT | 1 refills | Status: DC | PRN

## 2022-05-20 NOTE — Assessment & Plan Note (Signed)
Acute on chronic, has not received a summary or call from CT scan and is anxiety regarding that Her SO continues to wax/wane and that is also affecting her nerves Defer use of anxiety medication given concern for hypoxia recurrence as pt is non adherent with use of O2

## 2022-05-20 NOTE — Assessment & Plan Note (Signed)
CT notes chronic inflammation with concern for fibrosis; recommend continue use of inhaled steroids as well as repeat oral steroids Encouraged to follow up with pulmonary for next steps- including use of ABX

## 2022-05-20 NOTE — Progress Notes (Signed)
I,Connie R Striblin,acting as a Education administrator for [name], FNP.,have documented all relevant documentation on the behalf of [name], FNP,as directed by  [name], FNP while in the presence of [name], FNP.  Established patient visit  Patient: Denise Watts   DOB: 1955/08/13   67 y.o. Female  MRN: [id number] Visit Date: 05/20/2022  Today's healthcare provider: [name], FNP Re Introduced to nurse practitioner role and practice setting.  All questions answered.  Discussed provider/patient relationship and expectations.  Subjective    HPI  Follow up for CT results   The patient was last seen for this 1 weeks ago. She feels that condition is Unchanged.  Pt complains of server cough and SOB, which is not improved by breathing treatments.  -----------------------------------------------------------------------------------------  Medications: Outpatient Medications Prior to Visit  Medication Sig   albuterol (VENTOLIN HFA) 108 (90 Base) MCG/ACT inhaler Inhale 2 puffs into the lungs every 6 (six) hours as needed for shortness of breath.   alum & mag hydroxide-simeth (MAALOX PLUS) 400-400-40 MG/5ML suspension Take 15 mLs by mouth every 6 (six) hours as needed for indigestion.   aspirin EC 81 MG tablet Take 1 tablet (81 mg total) by mouth daily. Swallow whole.   benzonatate (TESSALON) 100 MG capsule Take 2 capsules (200 mg total) by mouth 3 (three) times daily.   Budeson-Glycopyrrol-Formoterol (BREZTRI AEROSPHERE) 160-9-4.8 MCG/ACT AERO Inhale 2 puffs into the lungs in the morning and at bedtime.   buPROPion (WELLBUTRIN) 75 MG tablet Take 75 mg by mouth 2 (two) times daily.   busPIRone (BUSPAR) 5 MG tablet Take 1 tablet (5 mg total) by mouth 2 (two) times daily.   cyclobenzaprine (FLEXERIL) 5 MG tablet TAKE ONE TABLET BY MOUTH AT BEDTIME   DULoxetine (CYMBALTA) 60 MG capsule Take 1 capsule (60 mg total) by mouth daily.   fluticasone (FLONASE) 50 MCG/ACT nasal spray Place 1  spray into both nostrils at bedtime.   gabapentin (NEURONTIN) 300 MG capsule TAKE 1 CAPSULE BY MOUTH 3 TIMES DAILY   mirabegron ER (MYRBETRIQ) 25 MG TB24 tablet Take 1 tablet (25 mg total) by mouth daily.   montelukast (SINGULAIR) 10 MG tablet Take 1 tablet (10 mg total) by mouth at bedtime.   omeprazole (PRILOSEC) 40 MG capsule Take 1 capsule (40 mg total) by mouth daily.   rOPINIRole (REQUIP) 0.25 MG tablet Take 1 tablet (0.25 mg total) by mouth at bedtime.   rosuvastatin (CRESTOR) 40 MG tablet Take 1 tablet (40 mg total) by mouth daily.   sucralfate (CARAFATE) 1 g tablet Take 1 tablet (1 g total) by mouth 4 (four) times daily -  with meals and at bedtime.   meloxicam (MOBIC) 7.5 MG tablet Take 7.5 mg by mouth daily. (Patient not taking: Reported on 05/20/2022)   [DISCONTINUED] brompheniramine-pseudoephedrine-DM 30-2-10 MG/5ML syrup Take 5 mLs by mouth 4 (four) times daily as needed. (Patient not taking: Reported on 05/20/2022)   [DISCONTINUED] predniSONE (DELTASONE) 10 MG tablet Day 1 & 2 take 6 tablets Day 3 &4 take 5 tablets Day 5 &6 take 4 tablets Day 7 & 8 take 3 tablets Day 9 & 10 take 2 tablets Day 11 & 12 take 1 tablet Day 13 & 14 take 1/2 tablet (Patient not taking: Reported on 05/20/2022)   Facility-Administered Medications Prior to Visit  Medication Dose Route Frequency Provider   0.9 %  sodium chloride infusion   Intravenous Once PRN Sindy Guadeloupe, MD   albuterol (  PROVENTIL) (2.5 MG/3ML) 0.083% nebulizer solution 2.5 mg  2.5 mg Nebulization Once PRN Sindy Guadeloupe, MD   cyanocobalamin ((VITAMIN B-12)) injection 1,000 mcg  1,000 mcg Intramuscular Q30 days Sindy Guadeloupe, MD   cyanocobalamin (VITAMIN B12) injection 1,000 mcg  1,000 mcg Intramuscular Q30 days Sindy Guadeloupe, MD    Review of Systems     Objective    BP 110/76 (BP Location: Left Arm, Patient Position: Sitting, Cuff Size: Normal)   Pulse (!) 109   Temp (!) 97.5 F (36.4 C) (Oral)   Wt 147 lb 8 oz (66.9 kg)   SpO2  95%   BMI 27.87 kg/m    Physical Exam Vitals and nursing note reviewed.  Constitutional:      General: She is not in acute distress.    Appearance: Normal appearance. She is overweight. She is not ill-appearing, toxic-appearing or diaphoretic.  HENT:     Head: Normocephalic and atraumatic.  Cardiovascular:     Rate and Rhythm: Regular rhythm. Tachycardia present.     Pulses: Normal pulses.     Heart sounds: Normal heart sounds. No murmur heard.    No friction rub. No gallop.  Pulmonary:     Effort: Pulmonary effort is normal. Prolonged expiration present. No respiratory distress.     Breath sounds: Decreased air movement present. No stridor. Examination of the right-upper field reveals decreased breath sounds. Examination of the left-upper field reveals decreased breath sounds. Examination of the left-middle field reveals rhonchi. Examination of the right-lower field reveals decreased breath sounds. Examination of the left-lower field reveals rhonchi. Decreased breath sounds and rhonchi present. No wheezing or rales.  Chest:     Chest wall: No tenderness.  Abdominal:     General: Bowel sounds are normal.     Palpations: Abdomen is soft.  Musculoskeletal:        General: No swelling, tenderness, deformity or signs of injury. Normal range of motion.     Right lower leg: No edema.     Left lower leg: No edema.  Skin:    General: Skin is warm and dry.     Capillary Refill: Capillary refill takes less than 2 seconds.     Coloration: Skin is not jaundiced or pale.     Findings: No bruising, erythema, lesion or rash.  Neurological:     General: No focal deficit present.     Mental Status: She is alert and oriented to person, place, and time. Mental status is at baseline.     Cranial Nerves: No cranial nerve deficit.     Sensory: No sensory deficit.     Motor: No weakness.     Coordination: Coordination normal.  Psychiatric:        Mood and Affect: Mood normal.        Behavior:  Behavior normal.        Thought Content: Thought content normal.        Judgment: Judgment normal.     No results found for any visits on 05/20/22.  Assessment & Plan     Problem List Items Addressed This Visit       Respiratory   Lung nodules - Primary    CT notes history of chronic lung nodules, concerning for increase in both size and number Additional referral placed to expedite visit with pulmonary team      Relevant Orders   Ambulatory referral to Pulmonology   Pneumonia of both upper lobes due to Mycoplasma pneumoniae  CT notes chronic inflammation with concern for fibrosis; recommend continue use of inhaled steroids as well as repeat oral steroids Encouraged to follow up with pulmonary for next steps- including use of ABX      Relevant Medications   albuterol (PROVENTIL) (2.5 MG/3ML) 0.083% nebulizer solution   brompheniramine-pseudoephedrine-DM 30-2-10 MG/5ML syrup   Other Relevant Orders   Ambulatory referral to Pulmonology     Other   GAD (generalized anxiety disorder)    Acute on chronic, has not received a summary or call from CT scan and is anxiety regarding that Her SO continues to wax/wane and that is also affecting her nerves Defer use of anxiety medication given concern for hypoxia recurrence as pt is non adherent with use of O2      Return if symptoms worsen or fail to improve.     Vonna Kotyk, FNP, have reviewed all documentation for this visit. The documentation on 05/20/22 for the exam, diagnosis, procedures, and orders are all accurate and complete.  [name], [place] [phone] (phone) [phone] (fax)  [place]

## 2022-05-20 NOTE — Patient Instructions (Signed)
The CDC recommends two doses of Shingrix (the new shingles vaccine) separated by 2 to 6 months for adults age 69 years and older. I recommend checking with your insurance plan regarding coverage for this vaccine.    

## 2022-05-20 NOTE — Assessment & Plan Note (Signed)
CT notes history of chronic lung nodules, concerning for increase in both size and number Additional referral placed to expedite visit with pulmonary team

## 2022-05-21 ENCOUNTER — Other Ambulatory Visit: Payer: Self-pay | Admitting: Family Medicine

## 2022-05-21 DIAGNOSIS — G894 Chronic pain syndrome: Secondary | ICD-10-CM

## 2022-05-21 DIAGNOSIS — J411 Mucopurulent chronic bronchitis: Secondary | ICD-10-CM

## 2022-05-21 DIAGNOSIS — R1084 Generalized abdominal pain: Secondary | ICD-10-CM

## 2022-05-21 DIAGNOSIS — K439 Ventral hernia without obstruction or gangrene: Secondary | ICD-10-CM

## 2022-05-23 ENCOUNTER — Ambulatory Visit: Payer: Medicare HMO | Admitting: Gastroenterology

## 2022-05-23 MED ORDER — BENZONATATE 100 MG PO CAPS: 200.0000 mg | ORAL_CAPSULE | Freq: Three times a day (TID) | ORAL | 0 refills | Status: DC

## 2022-05-23 MED ORDER — MELOXICAM 7.5 MG PO TABS: 7.5000 mg | ORAL_TABLET | Freq: Every day | ORAL | 0 refills | Status: DC

## 2022-05-23 MED ORDER — ALUM & MAG HYDROXIDE-SIMETH 400-400-40 MG/5ML PO SUSP: 15.0000 mL | Freq: Four times a day (QID) | ORAL | 0 refills | Status: DC | PRN

## 2022-05-23 MED ORDER — CYCLOBENZAPRINE HCL 5 MG PO TABS: 5.0000 mg | ORAL_TABLET | Freq: Every day | ORAL | 0 refills | Status: DC

## 2022-05-25 DIAGNOSIS — U071 COVID-19: Secondary | ICD-10-CM | POA: Diagnosis not present

## 2022-05-25 DIAGNOSIS — M1711 Unilateral primary osteoarthritis, right knee: Secondary | ICD-10-CM | POA: Diagnosis not present

## 2022-05-26 ENCOUNTER — Encounter: Payer: Self-pay | Admitting: Family Medicine

## 2022-05-30 DIAGNOSIS — J028 Acute pharyngitis due to other specified organisms: Secondary | ICD-10-CM | POA: Diagnosis not present

## 2022-05-30 DIAGNOSIS — Z03818 Encounter for observation for suspected exposure to other biological agents ruled out: Secondary | ICD-10-CM | POA: Diagnosis not present

## 2022-05-30 DIAGNOSIS — B9789 Other viral agents as the cause of diseases classified elsewhere: Secondary | ICD-10-CM | POA: Diagnosis not present

## 2022-05-30 DIAGNOSIS — J441 Chronic obstructive pulmonary disease with (acute) exacerbation: Secondary | ICD-10-CM | POA: Diagnosis not present

## 2022-06-04 DIAGNOSIS — J449 Chronic obstructive pulmonary disease, unspecified: Secondary | ICD-10-CM | POA: Diagnosis not present

## 2022-06-04 DIAGNOSIS — U071 COVID-19: Secondary | ICD-10-CM | POA: Diagnosis not present

## 2022-06-07 DIAGNOSIS — D0462 Carcinoma in situ of skin of left upper limb, including shoulder: Secondary | ICD-10-CM | POA: Diagnosis not present

## 2022-06-07 DIAGNOSIS — D0472 Carcinoma in situ of skin of left lower limb, including hip: Secondary | ICD-10-CM | POA: Diagnosis not present

## 2022-06-13 ENCOUNTER — Other Ambulatory Visit: Payer: Self-pay | Admitting: Family Medicine

## 2022-06-20 ENCOUNTER — Inpatient Hospital Stay: Payer: Medicare PPO | Attending: Oncology

## 2022-06-20 ENCOUNTER — Other Ambulatory Visit: Payer: Self-pay | Admitting: Family Medicine

## 2022-06-20 DIAGNOSIS — E538 Deficiency of other specified B group vitamins: Secondary | ICD-10-CM

## 2022-06-20 DIAGNOSIS — D519 Vitamin B12 deficiency anemia, unspecified: Secondary | ICD-10-CM | POA: Diagnosis not present

## 2022-06-20 DIAGNOSIS — F32A Depression, unspecified: Secondary | ICD-10-CM

## 2022-06-20 DIAGNOSIS — Z23 Encounter for immunization: Secondary | ICD-10-CM

## 2022-06-20 MED ORDER — CYANOCOBALAMIN 1000 MCG/ML IJ SOLN
1000.0000 ug | INTRAMUSCULAR | Status: DC
  Administered 2022-06-20: 1000 ug via INTRAMUSCULAR
  Filled 2022-06-20: qty 1

## 2022-06-20 MED ORDER — CYANOCOBALAMIN 1000 MCG/ML IJ SOLN: 1000.0000 ug | Freq: Once | INTRAMUSCULAR | Status: DC

## 2022-06-23 ENCOUNTER — Other Ambulatory Visit: Payer: Self-pay

## 2022-06-23 ENCOUNTER — Telehealth: Payer: Self-pay | Admitting: Family Medicine

## 2022-06-23 MED ORDER — MELOXICAM 7.5 MG PO TABS: 7.5000 mg | ORAL_TABLET | Freq: Every day | ORAL | 0 refills | Status: DC

## 2022-06-23 NOTE — Telephone Encounter (Signed)
refilled 

## 2022-06-23 NOTE — Telephone Encounter (Signed)
[facility] faxed refill request for the following medications:  meloxicam (MOBIC) 7.5 MG tablet    Please advise.

## 2022-06-25 DIAGNOSIS — U071 COVID-19: Secondary | ICD-10-CM | POA: Diagnosis not present

## 2022-07-03 DIAGNOSIS — U071 COVID-19: Secondary | ICD-10-CM | POA: Diagnosis not present

## 2022-07-03 DIAGNOSIS — J449 Chronic obstructive pulmonary disease, unspecified: Secondary | ICD-10-CM | POA: Diagnosis not present

## 2022-07-14 ENCOUNTER — Other Ambulatory Visit: Payer: Self-pay

## 2022-07-14 ENCOUNTER — Ambulatory Visit (INDEPENDENT_AMBULATORY_CARE_PROVIDER_SITE_OTHER): Payer: Medicare PPO | Admitting: Physician Assistant

## 2022-07-14 ENCOUNTER — Encounter: Payer: Self-pay | Admitting: Physician Assistant

## 2022-07-14 VITALS — BP 114/72 | HR 110 | Temp 97.6°F | Ht 61.0 in | Wt 141.0 lb

## 2022-07-14 DIAGNOSIS — R0989 Other specified symptoms and signs involving the circulatory and respiratory systems: Secondary | ICD-10-CM | POA: Diagnosis not present

## 2022-07-14 DIAGNOSIS — E538 Deficiency of other specified B group vitamins: Secondary | ICD-10-CM

## 2022-07-14 DIAGNOSIS — J439 Emphysema, unspecified: Secondary | ICD-10-CM

## 2022-07-14 MED ORDER — AZITHROMYCIN 250 MG PO TABS: ORAL_TABLET | ORAL | 0 refills | Status: AC

## 2022-07-14 MED ORDER — PREDNISONE 20 MG PO TABS: 20.0000 mg | ORAL_TABLET | Freq: Every day | ORAL | 0 refills | Status: DC

## 2022-07-14 MED ORDER — MONTELUKAST SODIUM 10 MG PO TABS: 10.0000 mg | ORAL_TABLET | Freq: Every day | ORAL | 3 refills | Status: DC

## 2022-07-14 MED ORDER — BUPROPION HCL 75 MG PO TABS: 75.0000 mg | ORAL_TABLET | Freq: Two times a day (BID) | ORAL | 1 refills | Status: DC

## 2022-07-14 NOTE — Progress Notes (Signed)
Established patient visit   Patient: Denise Watts   DOB: 09/20/1955   67 y.o. Female  MRN: [id number] Visit Date: 07/14/2022  Today's healthcare provider: Mardene Speak, PA-C   CC: congestion with cough x more than a mo  Subjective     HPI   Pt stated--congested, cough w/ green mucus, fever, no appetite.--since last OV in 05/20/22. Last edited by Elta Guadeloupe, CMA on 07/14/2022  8:15 AM.      Pt states that  Her husband is sick with URI symptoms as well for the past 2 days Medications: Outpatient Medications Prior to Visit  Medication Sig   albuterol (PROVENTIL) (2.5 MG/3ML) 0.083% nebulizer solution Take 3 mLs (2.5 mg total) by nebulization every 6 (six) hours as needed for wheezing or shortness of breath.   albuterol (VENTOLIN HFA) 108 (90 Base) MCG/ACT inhaler Inhale 2 puffs into the lungs every 6 (six) hours as needed for shortness of breath.   alum & mag hydroxide-simeth (MAALOX PLUS) 400-400-40 MG/5ML suspension Take 15 mLs by mouth every 6 (six) hours as needed for indigestion.   aspirin EC 81 MG tablet Take 1 tablet (81 mg total) by mouth daily. Swallow whole.   benzonatate (TESSALON) 100 MG capsule Take 2 capsules (200 mg total) by mouth 3 (three) times daily.   brompheniramine-pseudoephedrine-DM 30-2-10 MG/5ML syrup Take 5 mLs by mouth 4 (four) times daily as needed.   Budeson-Glycopyrrol-Formoterol (BREZTRI AEROSPHERE) 160-9-4.8 MCG/ACT AERO Inhale 2 puffs into the lungs in the morning and at bedtime.   busPIRone (BUSPAR) 5 MG tablet Take 1 tablet (5 mg total) by mouth 2 (two) times daily.   cyclobenzaprine (FLEXERIL) 5 MG tablet Take 1 tablet (5 mg total) by mouth at bedtime.   DULoxetine (CYMBALTA) 60 MG capsule TAKE 1 CAPSULE BY MOUTH DAILY   fluticasone (FLONASE) 50 MCG/ACT nasal spray Place 1 spray into both nostrils at bedtime.   gabapentin (NEURONTIN) 300 MG capsule TAKE 1 CAPSULE BY MOUTH 3 TIMES DAILY   meloxicam (MOBIC) 7.5 MG tablet Take 1 tablet (7.5 mg  total) by mouth daily.   mirabegron ER (MYRBETRIQ) 25 MG TB24 tablet Take 1 tablet (25 mg total) by mouth daily.   omeprazole (PRILOSEC) 40 MG capsule TAKE 1 CAPSULE BY MOUTH DAILY   rOPINIRole (REQUIP) 0.25 MG tablet Take 1 tablet (0.25 mg total) by mouth at bedtime.   rosuvastatin (CRESTOR) 40 MG tablet Take 1 tablet (40 mg total) by mouth daily.   sucralfate (CARAFATE) 1 g tablet Take 1 tablet (1 g total) by mouth 4 (four) times daily -  with meals and at bedtime.   [DISCONTINUED] buPROPion (WELLBUTRIN) 75 MG tablet Take 75 mg by mouth 2 (two) times daily.   [DISCONTINUED] montelukast (SINGULAIR) 10 MG tablet Take 1 tablet (10 mg total) by mouth at bedtime.   predniSONE (DELTASONE) 10 MG tablet Day 1 & 2 take 6 tablets Day 3 &4 take 5 tablets Day 5 &6 take 4 tablets Day 7 & 8 take 3 tablets Day 9 & 10 take 2 tablets Day 11 & 12 take 1 tablet Day 13 & 14 take 1/2 tablet (Patient not taking: Reported on 07/14/2022)   Facility-Administered Medications Prior to Visit  Medication Dose Route Frequency Provider   0.9 %  sodium chloride infusion   Intravenous Once PRN Sindy Guadeloupe, MD   albuterol (PROVENTIL) (2.5 MG/3ML) 0.083% nebulizer solution 2.5 mg  2.5 mg Nebulization Once PRN Sindy Guadeloupe, MD  cyanocobalamin ((VITAMIN B-12)) injection 1,000 mcg  1,000 mcg Intramuscular Q30 days Sindy Guadeloupe, MD   cyanocobalamin (VITAMIN B12) injection 1,000 mcg  1,000 mcg Intramuscular Q30 days Sindy Guadeloupe, MD    Review of Systems  All other systems reviewed and are negative. Except see HPI     Objective    BP 114/72 (BP Location: Left Arm, Patient Position: Sitting, Cuff Size: Normal)   Pulse (!) 110   Temp 97.6 F (36.4 C)   Ht 5\' 1"  (1.549 m)   Wt 141 lb (64 kg)   SpO2 95%   BMI 26.64 kg/m    Physical Exam Vitals reviewed.  Constitutional:      General: She is not in acute distress.    Appearance: Normal appearance. She is well-developed. She is not diaphoretic.  HENT:      Head: Normocephalic and atraumatic.     Right Ear: Ear canal and external ear normal.     Left Ear: Ear canal and external ear normal.     Ears:     Comments: Fluid behind the tympanic membranes    Nose: Congestion and rhinorrhea present.     Mouth/Throat:     Pharynx: No posterior oropharyngeal erythema (Mild).  Eyes:     General: No scleral icterus.       Right eye: No discharge.        Left eye: No discharge.     Conjunctiva/sclera: Conjunctivae normal.     Pupils: Pupils are equal, round, and reactive to light.  Neck:     Thyroid: No thyromegaly.  Cardiovascular:     Rate and Rhythm: Normal rate and regular rhythm.     Pulses: Normal pulses.     Heart sounds: Normal heart sounds. No murmur heard. Pulmonary:     Effort: Pulmonary effort is normal. No respiratory distress.     Breath sounds: Rhonchi (Mild, intermittent) present. No wheezing or rales.  Musculoskeletal:     Cervical back: Neck supple.     Right lower leg: No edema.     Left lower leg: No edema.  Lymphadenopathy:     Cervical: No cervical adenopathy.  Skin:    General: Skin is warm and dry.     Findings: No rash.  Neurological:     Mental Status: She is alert and oriented to person, place, and time. Mental status is at baseline.  Psychiatric:        Behavior: Behavior normal.        Thought Content: Thought content normal.        Judgment: Judgment normal.    No results found for any visits on 07/14/22.  Assessment & Plan     Symptoms of URI Acute vs chronic/per patient Elevated HR but rest of vitals are normal including ox sat Could be due to new viral infection, exacerbation of COPD, upper respiratory infection Advised symptomatic treatment Increase fluids. Rest.Saline nasal spray. Mucinex as directed. Humidifier in bedroom. Flonase OTC . Warm salt gargles and hot tea with honey. Tenting. Symptoms of upper respiratory infection (URI) - Plan: azithromycin (ZITHROMAX) 250 MG tablet, predniSONE  (DELTASONE) 20 MG tablet Patient was instructed not to fill it unless their symptoms worsen   Discussed the potential negative side effects of antibiotics and that they can lead to resistance if used unnecessarily Discussed expectations for duration of symptoms. Discussed that they may feel bad for up to a week, but sometimes symptoms last longer (especially if you smoke)  Explained  that you can get multiple viruses in a row and recommend prevention strategies such as hand washing Made a plan with the patient to outline what to do if symptoms worsen or do not improve, or if they develop concerning symptoms like high fever, SOB,-    The patient was advised to call back if symptoms worsen/fever, chest congestion, shortness of breath or seek an in-person evaluation if the symptoms worsen or if the condition fails to improve as anticipated.  No follow-ups on file.     I discussed the assessment and treatment plan with the patient. The patient was provided an opportunity to ask questions and all were answered. The patient agreed with the plan and demonstrated an understanding of the instructions.  I, Mardene Speak, PA-C have reviewed all documentation for this visit. The documentation on 07/14/2022 for the exam, diagnosis, procedures, and orders are all accurate and complete.  Mardene Speak, [facility], [place] [phone] (phone) [phone] (fax)  [place]

## 2022-07-19 ENCOUNTER — Inpatient Hospital Stay: Payer: Medicare PPO | Attending: Oncology

## 2022-07-19 DIAGNOSIS — D519 Vitamin B12 deficiency anemia, unspecified: Secondary | ICD-10-CM | POA: Diagnosis not present

## 2022-07-19 DIAGNOSIS — E538 Deficiency of other specified B group vitamins: Secondary | ICD-10-CM

## 2022-07-19 DIAGNOSIS — Z23 Encounter for immunization: Secondary | ICD-10-CM

## 2022-07-19 MED ORDER — CYANOCOBALAMIN 1000 MCG/ML IJ SOLN
1000.0000 ug | INTRAMUSCULAR | Status: DC
  Administered 2022-07-19: 1000 ug via INTRAMUSCULAR
  Filled 2022-07-19: qty 1

## 2022-07-20 ENCOUNTER — Other Ambulatory Visit: Payer: Self-pay | Admitting: Family Medicine

## 2022-07-20 DIAGNOSIS — G894 Chronic pain syndrome: Secondary | ICD-10-CM

## 2022-07-20 NOTE — Progress Notes (Signed)
07/21/2022 9:52 AM   Denise Watts [birth year] [id number]  Referring provider: [name], [place],  [place] [address]  Urological history: 1. OAB wet -contributing factors of age, smoking, vaginal atrophy, COPD, anxiety, depression, parkinsonism, pre-diabetic and muscle relaxer's -cysto 2022- NED -failed Gemtesa 75 mg daily -Managed with oxybutynin XL 15 mg daily   2. Stress incontinence -contributing factors of age and vaginal atrophy   3. 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 female who presents today for urgency with no control, pain when urinates.    She has been having 2 weeks of urge incontinence with painful urination.  She is currently on a Z-Pak for sinus infection.  Patient denies any modifying or aggravating factors.  Patient denies any gross hematuria or suprapubic/flank pain.  Patient denies any fevers, chills, nausea or vomiting.    She also states that she has been having a slimy vaginal discharge every morning for the last several weeks.  She has started to experience vaginal itching over the last week.   She was seen by gynecology back in August 2023 for similar complaints and wet prep was negative at that time.  It stated in the note that she was to follow-up with the MD in 2 weeks, but that appointment was not completed.  UA yellow slightly cloudy, specific gravity 1.025, 1+ blood, pH 5.5, 2+ protein, 1+ leukocyte, greater than 30 WBCs, 11-30 RBCs, 0-10 epithelial cells, granular casts are present, mucus threads are present and moderate bacteria.  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   Cough 03/02/2017   INTERMTTENT DRY COUGH   Depression    GERD (gastroesophageal reflux disease)    History of hiatal hernia    Personal history of chemotherapy    Personal history of radiation therapy    Skin cancer    Throat cancer ([place]) 1998   radiation   Wears dentures    full upper and lower    Surgical History: Past Surgical History:  Procedure Laterality Date   BREAST BIOPSY Right 1997   positive   BREAST BIOPSY Right 11/15/2017   INVASIVE MAMMARY CARCINOMA triple negative   BREAST BIOPSY Right 07/20/2020   [place] bx, Q marker, neg   BREAST LUMPECTOMY Right 1997   2019 also   BREAST LUMPECTOMY WITH SENTINEL LYMPH NODE BIOPSY Right 12/08/2017   Procedure: BREAST LUMPECTOMY WITH SENTINEL LYMPH NODE BX;  Surgeon: Robert Bellow, MD;  Location: [facility];  Service: General;  Laterality: Right;   BREAST REDUCTION WITH MASTOPEXY Left 06/10/2019   Procedure: Left breast mastopexy reduction for symmetry;  Surgeon: Wallace Going, DO;  Location: [place];  Service: Plastics;  Laterality: Left;  total 2.5 hours   CATARACT EXTRACTION W/PHACO Right 02/07/2022   Procedure: CATARACT EXTRACTION PHACO AND INTRAOCULAR LENS PLACEMENT (IOC) RIGHT DIABETIC;  Surgeon: Eulogio Bear, MD;  Location: [place];  Service: Ophthalmology;  Laterality: Right;  3.60 00:34.7   CATARACT EXTRACTION W/PHACO Left 02/28/2022   Procedure: CATARACT EXTRACTION PHACO AND INTRAOCULAR LENS PLACEMENT (IOC) LEFT DIABETIC 4.11 00:26.1;  Surgeon: Eulogio Bear, MD;  Location: [place];  Service: Ophthalmology;  Laterality: Left;   COLONOSCOPY  2015   COLONOSCOPY WITH PROPOFOL N/A 10/29/2019   Procedure: COLONOSCOPY WITH PROPOFOL;  Surgeon: Lucilla Lame, MD;  Location: [facility];  Service: Endoscopy;  Laterality: N/A;    ESOPHAGOGASTRODUODENOSCOPY (EGD) WITH PROPOFOL N/A 05/24/2019   Procedure: ESOPHAGOGASTRODUODENOSCOPY (EGD) WITH PROPOFOL;  Surgeon: Lucilla Lame, MD;  Location: [facility] ENDOSCOPY;  Service: Endoscopy;  Laterality: N/A;   ESOPHAGUS SURGERY     KNEE SURGERY Right    LIPOSUCTION WITH LIPOFILLING Right 06/10/2019   Procedure: right breast scar and capsule release with fat grafting;  Surgeon: Wallace Going, DO;  Location: [place];  Service: Plastics;  Laterality: Right;   PORT-A-CATH REMOVAL     PORTACATH PLACEMENT Right 01/17/2018   Procedure: INSERTION PORT-A-CATH- RIGHT;  Surgeon: Robert Bellow, MD;  Location: [place] [facility];  Service: General;  Laterality: Right;   ROBOTIC ADRENALECTOMY Right 12/28/2016   Procedure: ROBOTIC ADRENALECTOMY;  Surgeon: Hollice Espy, MD;  Location: [facility];  Service: Urology;  Laterality: Right;   SKIN CANCER EXCISION     THROAT SURGERY  1998   throat cancer    TUBAL LIGATION     VENTRAL HERNIA REPAIR N/A 03/03/2017   Procedure: HERNIA REPAIR VENTRAL ADULT;  Surgeon: Clayburn Pert, MD;  Location: [facility];  Service: General;  Laterality: N/A;    Home Medications:  Allergies as of 07/21/2022       Reactions   Sulfa Antibiotics Other (See Comments)   Mouth sore and raw        Medication List        Accurate as of July 21, 2022  9:52 AM. If you have any questions, ask your nurse or doctor.          albuterol 108 (90 Base) MCG/ACT inhaler Commonly known as: VENTOLIN HFA Inhale 2 puffs into the lungs every 6 (six) hours as needed for shortness of breath.   albuterol (2.5 MG/3ML) 0.083% nebulizer solution Commonly known as: PROVENTIL Take 3 mLs (2.5 mg total) by nebulization every 6 (six) hours as needed for wheezing or shortness of breath.   alum & mag hydroxide-simeth [id number] MG/5ML suspension Commonly known as: MAALOX PLUS Take 15 mLs by mouth every 6 (six) hours as needed for indigestion.   aspirin EC 81 MG  tablet Take 1 tablet (81 mg total) by mouth daily. Swallow whole.   azithromycin 500 MG tablet Commonly known as: ZITHROMAX   benzonatate 100 MG capsule Commonly known as: TESSALON Take 2 capsules (200 mg total) by mouth 3 (three) times daily.   Breztri Aerosphere 160-9-4.8 MCG/ACT Aero Generic drug: Budeson-Glycopyrrol-Formoterol Inhale 2 puffs into the lungs in the morning and at bedtime.   brompheniramine-pseudoephedrine-DM 30-2-10 MG/5ML syrup Take 5 mLs by mouth 4 (four) times daily as needed.   buPROPion 75 MG tablet Commonly known as: WELLBUTRIN Take 1 tablet (75 mg total) by mouth 2 (two) times daily. Take 75 mg by mouth 2 (two) times daily.   busPIRone 5 MG tablet Commonly known as: BUSPAR Take 1 tablet (5 mg total) by mouth 2 (two) times daily.   cyclobenzaprine 5 MG tablet Commonly known as: FLEXERIL TAKE 1 TABLET BY MOUTH AT BEDTIME   DULoxetine 60 MG capsule Commonly known as: CYMBALTA TAKE 1 CAPSULE  BY MOUTH DAILY   fluticasone 50 MCG/ACT nasal spray Commonly known as: FLONASE Place 1 spray into both nostrils at bedtime.   gabapentin 300 MG capsule Commonly known as: NEURONTIN TAKE 1 CAPSULE BY MOUTH 3 TIMES DAILY   meloxicam 7.5 MG tablet Commonly known as: MOBIC Take 1 tablet (7.5 mg total) by mouth daily.   mirabegron ER 25 MG Tb24 tablet Commonly known as: MYRBETRIQ Take 1 tablet (25 mg total) by mouth daily.   montelukast 10 MG tablet Commonly known as: SINGULAIR Take 1 tablet (10 mg total) by mouth at bedtime.   omeprazole 40 MG capsule Commonly known as: PRILOSEC TAKE 1 CAPSULE BY MOUTH DAILY   predniSONE 20 MG tablet Commonly known as: DELTASONE Take 1 tablet (20 mg total) by mouth daily with breakfast.   rOPINIRole 0.25 MG tablet Commonly known as: REQUIP Take 1 tablet (0.25 mg total) by mouth at bedtime.   rosuvastatin 40 MG tablet Commonly known as: CRESTOR Take 1 tablet (40 mg total) by mouth daily.   sucralfate 1 g  tablet Commonly known as: CARAFATE Take 1 tablet (1 g total) by mouth 4 (four) times daily -  with meals and at bedtime.        Allergies:  Allergies  Allergen Reactions   Sulfa Antibiotics Other (See Comments)    Mouth sore and raw    Family History: Family History  Problem Relation Age of Onset   Diabetes Mother    Heart attack Mother    Cancer Sister        cancer of eyes, spread to lung, other places. died at 21   Cancer Sister 73       unknown primary, spread to bone, brain died in 65's   Cancer Paternal Aunt        unk type   Cancer Paternal Grandmother        type unk   Prostate cancer Neg Hx    Kidney cancer Neg Hx    Bladder Cancer Neg Hx    Breast cancer Neg Hx     Social History:  reports that she quit smoking about 27 years ago. Her smoking use included cigarettes. She has a 45.00 pack-year smoking history. She has never used smokeless tobacco. She reports that she does not currently use alcohol after a past usage of about 4.0 standard drinks of alcohol per week. She reports that she does not use drugs.  ROS: Pertinent ROS in HPI  Physical Exam: BP 95/62   Pulse (!) 102   Ht 5\' 1"  (1.549 m)   Wt 141 lb 3.2 oz (64 kg)   BMI 26.68 kg/m   Constitutional:  Well nourished. Alert and oriented, No acute distress. HEENT: [place] AT, moist mucus membranes.  Trachea midline Cardiovascular: No clubbing, cyanosis, or edema. Respiratory: Normal respiratory effort, no increased work of breathing. Neurologic: Grossly intact, no focal deficits, moving all 4 extremities. Psychiatric: Normal mood and affect.    Laboratory Data: Urinalysis  See EPIC and HPI I have reviewed the labs.   Pertinent Imaging:  07/21/22 09:21  Scan Result 24ml      Assessment & Plan:    1.  Dysuria -UA grossly infected -urine sent for culture -start Macrobid 100 mg twice daily until culture results are available and will adjust if necessary if sensitivities require  2. Urge  incontinence -Will reassess when she returns in 2 months to ensure the urge incontinence returns to baseline once infection is treated  3.  Microscopic hematuria -Explained that this is likely due to a urinary tract infection, but I would like to see her back in 2 months for repeat urinalysis to ensure the microscopic hematuria resolves after the treatment of her infection and if it doesn't I informed her that we would need to pursue additional studies to rule out stones, kidney and/or bladder cancer  4. Vaginal discharge -given a script for Diflucan -will refer back to gynecology for further work up  Return in about 2 months (around 09/20/2022) for UA and symptoms recheck .  These notes generated with voice recognition software. I apologize for typographical errors.  [place], [place] [address]  [place], [place] [address] [phone]

## 2022-07-21 ENCOUNTER — Ambulatory Visit: Payer: Medicare PPO | Admitting: Urology

## 2022-07-21 ENCOUNTER — Encounter: Payer: Self-pay | Admitting: Urology

## 2022-07-21 VITALS — BP 95/62 | HR 102 | Ht 61.0 in | Wt 141.2 lb

## 2022-07-21 DIAGNOSIS — R300 Dysuria: Secondary | ICD-10-CM

## 2022-07-21 DIAGNOSIS — N3941 Urge incontinence: Secondary | ICD-10-CM | POA: Diagnosis not present

## 2022-07-21 DIAGNOSIS — N898 Other specified noninflammatory disorders of vagina: Secondary | ICD-10-CM | POA: Diagnosis not present

## 2022-07-21 DIAGNOSIS — R3129 Other microscopic hematuria: Secondary | ICD-10-CM

## 2022-07-21 DIAGNOSIS — R3989 Other symptoms and signs involving the genitourinary system: Secondary | ICD-10-CM | POA: Diagnosis not present

## 2022-07-21 LAB — MICROSCOPIC EXAMINATION: WBC, UA: >30 /hpf — AB (ref 0–5)

## 2022-07-21 LAB — URINALYSIS, COMPLETE
Bilirubin, UA: NEGATIVE
Glucose, UA: NEGATIVE
Ketones, UA: NEGATIVE
Nitrite, UA: NEGATIVE
Specific Gravity, UA: 1.025 (ref 1.005–1.030)
Urobilinogen, Ur: 0.2 mg/dL (ref 0.2–1.0)
pH, UA: 5.5 (ref 5.0–7.5)

## 2022-07-21 LAB — BLADDER SCAN AMB NON-IMAGING

## 2022-07-21 MED ORDER — FLUCONAZOLE 150 MG PO TABS: 150.0000 mg | ORAL_TABLET | Freq: Once | ORAL | 0 refills | Status: AC

## 2022-07-21 MED ORDER — NITROFURANTOIN MONOHYD MACRO 100 MG PO CAPS: 100.0000 mg | ORAL_CAPSULE | Freq: Two times a day (BID) | ORAL | 0 refills | Status: DC

## 2022-07-21 NOTE — Telephone Encounter (Signed)
Clearance faxed x 2 Pulm clearance for recall

## 2022-07-24 DIAGNOSIS — U071 COVID-19: Secondary | ICD-10-CM | POA: Diagnosis not present

## 2022-07-25 LAB — CULTURE, URINE COMPREHENSIVE

## 2022-07-26 ENCOUNTER — Telehealth: Payer: Self-pay | Admitting: Family Medicine

## 2022-07-26 DIAGNOSIS — J441 Chronic obstructive pulmonary disease with (acute) exacerbation: Secondary | ICD-10-CM | POA: Diagnosis not present

## 2022-07-26 NOTE — Telephone Encounter (Signed)
Patient notified and voiced understanding. Cysto appointment scheduled.

## 2022-07-26 NOTE — Telephone Encounter (Signed)
-----   Message from Nori Riis, PA-C sent at 07/26/2022  8:28 AM EDT ----- Please let Denise Watts know that her urine culture only grew out benign bacteria, so she does not have an infection.  We need to move onto a cystoscopy with Dr. Erlene Quan for further investigation into what is causing her symptoms.

## 2022-07-29 ENCOUNTER — Other Ambulatory Visit: Payer: Self-pay | Admitting: Family Medicine

## 2022-08-03 DIAGNOSIS — U071 COVID-19: Secondary | ICD-10-CM | POA: Diagnosis not present

## 2022-08-03 DIAGNOSIS — J449 Chronic obstructive pulmonary disease, unspecified: Secondary | ICD-10-CM | POA: Diagnosis not present

## 2022-08-04 ENCOUNTER — Other Ambulatory Visit: Payer: Self-pay | Admitting: Family Medicine

## 2022-08-04 ENCOUNTER — Other Ambulatory Visit: Payer: Self-pay | Admitting: Urology

## 2022-08-04 DIAGNOSIS — R3989 Other symptoms and signs involving the genitourinary system: Secondary | ICD-10-CM

## 2022-08-04 DIAGNOSIS — G2581 Restless legs syndrome: Secondary | ICD-10-CM

## 2022-08-04 DIAGNOSIS — J411 Mucopurulent chronic bronchitis: Secondary | ICD-10-CM

## 2022-08-04 NOTE — Telephone Encounter (Signed)
Requested Prescriptions  Pending Prescriptions Disp Refills   rOPINIRole (REQUIP) 0.25 MG tablet [Pharmacy Med Name: ROPINIROLE HCL 0.25 MG TAB] 90 tablet 1    Sig: TAKE 1 TABLET BY MOUTH AT BEDTIME     Neurology:  Parkinsonian Agents Passed - 08/04/2022  9:17 AM      Passed - Last BP in normal range    BP Readings from Last 1 Encounters:  07/21/22 95/62         Passed - Last Heart Rate in normal range    Pulse Readings from Last 1 Encounters:  07/21/22 (!) 102         Passed - Valid encounter within last 12 months    Recent Outpatient Visits           3 weeks ago Symptoms of upper respiratory infection (URI)   [place], [place], PA-C   2 months ago Lung nodules   [place] Tally Joe T, FNP   4 months ago Chronic pain syndrome   [place] Tally Joe T, FNP   6 months ago Right arm numbness   [place], [place], PA-C   7 months ago Mucopurulent chronic bronchitis [facility])   [place] [name], FNP       Future Appointments             In 3 weeks Lucilla Lame, MD [facility] [place]   In 1 month [place], [place], [facility] [place]             benzonatate (TESSALON) 100 MG capsule [Pharmacy Med Name: BENZONATATE 100 MG CAP] 180 capsule 0    Sig: TAKE 2 CAPSULES BY MOUTH 3 TIMES DAILY     Ear, Nose, and Throat:  Antitussives/Expectorants Passed - 08/04/2022  9:17 AM      Passed - Valid encounter within last 12 months    Recent Outpatient Visits           3 weeks ago Symptoms of upper respiratory infection (URI)   [place], [place], PA-C   2 months ago Lung nodules   [facility] Tally Joe T, FNP   4 months ago Chronic pain syndrome   [place] Tally Joe T,  FNP   6 months ago Right arm numbness   [place], [place], PA-C   7 months ago Mucopurulent chronic bronchitis [facility])   [place] Tally Joe T, FNP       Future Appointments             In 3 weeks Lucilla Lame, MD [facility] [place]   In 1 month [place], [place]

## 2022-08-05 ENCOUNTER — Other Ambulatory Visit: Payer: Self-pay | Admitting: Urology

## 2022-08-05 DIAGNOSIS — R3989 Other symptoms and signs involving the genitourinary system: Secondary | ICD-10-CM

## 2022-08-16 DIAGNOSIS — D0461 Carcinoma in situ of skin of right upper limb, including shoulder: Secondary | ICD-10-CM | POA: Diagnosis not present

## 2022-08-16 DIAGNOSIS — D2272 Melanocytic nevi of left lower limb, including hip: Secondary | ICD-10-CM | POA: Diagnosis not present

## 2022-08-16 DIAGNOSIS — C44722 Squamous cell carcinoma of skin of right lower limb, including hip: Secondary | ICD-10-CM | POA: Diagnosis not present

## 2022-08-16 DIAGNOSIS — Z09 Encounter for follow-up examination after completed treatment for conditions other than malignant neoplasm: Secondary | ICD-10-CM | POA: Diagnosis not present

## 2022-08-16 DIAGNOSIS — D485 Neoplasm of uncertain behavior of skin: Secondary | ICD-10-CM | POA: Diagnosis not present

## 2022-08-16 DIAGNOSIS — Z872 Personal history of diseases of the skin and subcutaneous tissue: Secondary | ICD-10-CM | POA: Diagnosis not present

## 2022-08-16 DIAGNOSIS — D2262 Melanocytic nevi of left upper limb, including shoulder: Secondary | ICD-10-CM | POA: Diagnosis not present

## 2022-08-16 DIAGNOSIS — L570 Actinic keratosis: Secondary | ICD-10-CM | POA: Diagnosis not present

## 2022-08-16 DIAGNOSIS — D0472 Carcinoma in situ of skin of left lower limb, including hip: Secondary | ICD-10-CM | POA: Diagnosis not present

## 2022-08-16 DIAGNOSIS — Z85828 Personal history of other malignant neoplasm of skin: Secondary | ICD-10-CM | POA: Diagnosis not present

## 2022-08-16 DIAGNOSIS — D2261 Melanocytic nevi of right upper limb, including shoulder: Secondary | ICD-10-CM | POA: Diagnosis not present

## 2022-08-18 ENCOUNTER — Ambulatory Visit: Payer: Medicare PPO | Admitting: Urology

## 2022-08-18 ENCOUNTER — Encounter: Payer: Self-pay | Admitting: Urology

## 2022-08-18 VITALS — BP 137/67 | HR 98 | Ht 61.0 in | Wt 141.0 lb

## 2022-08-18 DIAGNOSIS — R3129 Other microscopic hematuria: Secondary | ICD-10-CM | POA: Diagnosis not present

## 2022-08-18 DIAGNOSIS — N3941 Urge incontinence: Secondary | ICD-10-CM

## 2022-08-18 LAB — URINALYSIS, COMPLETE
Bilirubin, UA: NEGATIVE
Glucose, UA: NEGATIVE
Ketones, UA: NEGATIVE
Leukocytes,UA: NEGATIVE
Nitrite, UA: NEGATIVE
Protein,UA: NEGATIVE
RBC, UA: NEGATIVE
Specific Gravity, UA: 1.02 (ref 1.005–1.030)
Urobilinogen, Ur: 0.2 mg/dL (ref 0.2–1.0)
pH, UA: 5.5 (ref 5.0–7.5)

## 2022-08-18 LAB — MICROSCOPIC EXAMINATION

## 2022-08-18 NOTE — Progress Notes (Signed)
   08/18/22  CC:  Chief Complaint  Patient presents with   Cysto    HPI: 67 year old female who presents today for cystoscopy.  She was noted to have microscopic blood along with leukocytes at her last urinalysis.  There is some concern that she had a urinary tract infection however urine culture was negative.  She presents today for cystoscopy for further evaluation of blood in her urine at the previous visit.  Notably, she continues to complain of urgency frequency and her symptoms are not well-controlled.  Urinalysis today is negative.  NED. A&Ox3.   No respiratory distress   Abd soft, NT, ND Normal external genitalia with patent urethral meatus  Cystoscopy Procedure Note  Patient identification was confirmed, informed consent was obtained, and patient was prepped using Betadine solution.  Lidocaine jelly was administered per urethral meatus.    Procedure: - Flexible cystoscope introduced, without any difficulty.   - Thorough search of the bladder revealed:    normal urethral meatus    normal urothelium    no stones    no ulcers     no tumors    no urethral polyps    no trabeculation  - Ureteral orifices were normal in position and appearance.  Post-Procedure: - Patient tolerated the procedure well  Assessment/ Plan:  1. Microscopic hematuria Cystoscopy today was normal, reassuring  Has had numerous imaging studies over the past several years, reasonable to defer additional imaging at this point in time especially in light of her negative urinalysis today - Urinalysis, Complete  2. Urge incontinence Continues to be symptomatic, poorly controlled  Continue oxybutynin, will have her follow-up with PA to discuss this specific issue and medication optimization   Vanna Scotland, MD

## 2022-08-19 ENCOUNTER — Inpatient Hospital Stay: Payer: Medicare PPO | Attending: Oncology

## 2022-08-19 DIAGNOSIS — E538 Deficiency of other specified B group vitamins: Secondary | ICD-10-CM

## 2022-08-19 DIAGNOSIS — D519 Vitamin B12 deficiency anemia, unspecified: Secondary | ICD-10-CM | POA: Insufficient documentation

## 2022-08-19 DIAGNOSIS — Z23 Encounter for immunization: Secondary | ICD-10-CM

## 2022-08-19 MED ORDER — CYANOCOBALAMIN 1000 MCG/ML IJ SOLN
1000.0000 ug | INTRAMUSCULAR | Status: DC
  Administered 2022-08-19: 1000 ug via INTRAMUSCULAR
  Filled 2022-08-19: qty 1

## 2022-08-24 DIAGNOSIS — U071 COVID-19: Secondary | ICD-10-CM | POA: Diagnosis not present

## 2022-08-30 ENCOUNTER — Ambulatory Visit: Payer: Medicare PPO | Admitting: Gastroenterology

## 2022-08-30 ENCOUNTER — Encounter: Payer: Self-pay | Admitting: Gastroenterology

## 2022-08-30 VITALS — BP 127/83 | HR 106 | Temp 97.9°F | Wt 138.0 lb

## 2022-08-30 DIAGNOSIS — R1310 Dysphagia, unspecified: Secondary | ICD-10-CM

## 2022-08-30 NOTE — Progress Notes (Signed)
Gastroenterology Consultation  Referring Provider:     Jacky Kindle, FNP Primary Care Physician:  Jacky Kindle, FNP Primary Gastroenterologist:  Dr. Servando Snare     Reason for Consultation:     Dysphagia        HPI:   Denise Watts is a 67 y.o. y/o female referred for consultation & management of dysphagia by Dr. Suzie Portela, Daryl Eastern, FNP.  This patient comes in today with a history of breast cancer and throat cancer with radiation to her throat.  The patient has had a significant history of dysphagia with some transient improvement after dilation in the past.  The patient now reports that her dysphagia has become so bad that she chokes whenever she eats anything.  The patient denies any unexplained weight loss from his dysphagia.  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   Cough 03/02/2017   INTERMTTENT DRY COUGH   Depression    GERD (gastroesophageal reflux disease)    History of hiatal hernia    Personal history of chemotherapy    Personal history of radiation therapy    Skin cancer    Throat cancer (HCC) 1998   radiation   Wears dentures    full upper and lower    Past Surgical History:  Procedure Laterality Date   BREAST BIOPSY Right 1997   positive   BREAST BIOPSY Right 11/15/2017   INVASIVE MAMMARY CARCINOMA triple negative   BREAST BIOPSY Right 07/20/2020   [place] bx, Q marker, neg   BREAST LUMPECTOMY Right 1997   2019 also   BREAST LUMPECTOMY WITH SENTINEL LYMPH NODE BIOPSY Right 12/08/2017   Procedure: BREAST LUMPECTOMY WITH SENTINEL LYMPH NODE BX;  Surgeon: Earline Mayotte, MD;  Location: [facility];  Service: General;  Laterality: Right;   BREAST REDUCTION WITH MASTOPEXY Left 06/10/2019   Procedure: Left breast mastopexy reduction for symmetry;   Surgeon: Peggye Form, DO;  Location: [facility];  Service: Plastics;  Laterality: Left;  total 2.5 hours   CATARACT EXTRACTION W/PHACO Right 02/07/2022   Procedure: CATARACT EXTRACTION PHACO AND INTRAOCULAR LENS PLACEMENT (IOC) RIGHT DIABETIC;  Surgeon: Nevada Crane, MD;  Location: [facility];  Service: Ophthalmology;  Laterality: Right;  3.60 00:34.7   CATARACT EXTRACTION W/PHACO Left 02/28/2022   Procedure: CATARACT EXTRACTION PHACO AND INTRAOCULAR LENS PLACEMENT (IOC) LEFT DIABETIC 4.11 00:26.1;  Surgeon: Nevada Crane, MD;  Location: [facility];  Service: Ophthalmology;  Laterality: Left;   COLONOSCOPY  2015   COLONOSCOPY WITH PROPOFOL N/A 10/29/2019   Procedure: COLONOSCOPY WITH PROPOFOL;  Surgeon: Midge Minium, MD;  Location: [facility];  Service: Endoscopy;  Laterality: N/A;   ESOPHAGOGASTRODUODENOSCOPY (EGD) WITH PROPOFOL N/A 05/24/2019   Procedure: ESOPHAGOGASTRODUODENOSCOPY (EGD) WITH PROPOFOL;  Surgeon: Midge Minium, MD;  Location: [facility];  Service: Endoscopy;  Laterality: N/A;   ESOPHAGUS SURGERY     KNEE SURGERY Right    LIPOSUCTION WITH LIPOFILLING Right 06/10/2019   Procedure: right breast scar and capsule release with fat grafting;  Surgeon: Peggye Form, DO;  Location: [facility];  Service: Plastics;  Laterality: Right;   PORT-A-CATH REMOVAL     PORTACATH PLACEMENT Right 01/17/2018   Procedure: INSERTION  PORT-A-CATH- RIGHT;  Surgeon: Earline Mayotte, MD;  Location: [facility];  Service: General;  Laterality: Right;   ROBOTIC ADRENALECTOMY Right 12/28/2016   Procedure: ROBOTIC ADRENALECTOMY;  Surgeon: Vanna Scotland, MD;  Location: [facility];  Service: Urology;  Laterality: Right;   SKIN CANCER EXCISION     THROAT SURGERY  1998   throat cancer    TUBAL LIGATION     VENTRAL HERNIA REPAIR N/A 03/03/2017   Procedure: HERNIA REPAIR VENTRAL ADULT;  Surgeon: Ricarda Frame, MD;  Location: [facility];  Service: General;  Laterality: N/A;    Prior to Admission medications   Medication Sig Start Date End Date Taking? Authorizing Provider  albuterol (PROVENTIL) (2.5 MG/3ML) 0.083% nebulizer solution Take 3 mLs (2.5 mg total) by nebulization every 6 (six) hours as needed for wheezing or shortness of breath. 05/20/22  Yes Jacky Kindle, FNP  albuterol (VENTOLIN HFA) 108 (90 Base) MCG/ACT inhaler Inhale 2 puffs into the lungs every 6 (six) hours as needed for shortness of breath. 01/29/21  Yes Menshew, Charlesetta Ivory, PA-C  alum & mag hydroxide-simeth (MAALOX PLUS) 400-400-40 MG/5ML suspension Take 15 mLs by mouth every 6 (six) hours as needed for indigestion. 05/23/22  Yes Jacky Kindle, FNP  aspirin EC 81 MG tablet Take 1 tablet (81 mg total) by mouth daily. Swallow whole. 12/08/21  Yes Furth, Cadence H, PA-C  azithromycin (ZITHROMAX) 500 MG tablet    Yes [provider]  benzonatate (TESSALON) 100 MG capsule TAKE 2 CAPSULES BY MOUTH 3 TIMES DAILY 08/04/22  Yes Jacky Kindle, FNP  brompheniramine-pseudoephedrine-DM 30-2-10 MG/5ML syrup Take 5 mLs by mouth 4 (four) times daily as needed. 05/20/22  Yes Merita Norton T, FNP  Budeson-Glycopyrrol-Formoterol (BREZTRI AEROSPHERE) 160-9-4.8 MCG/ACT AERO Inhale 2 puffs into the lungs in the morning and at bedtime. 12/15/21  Yes Jacky Kindle, FNP  buPROPion (WELLBUTRIN) 75 MG tablet Take 1 tablet (75 mg total) by mouth 2 (two) times daily. Take 75 mg by mouth 2 (two) times daily. 07/14/22  Yes Bacigalupo, Marzella Schlein, MD  busPIRone (BUSPAR) 5 MG tablet Take 1 tablet (5 mg total) by mouth 2 (two) times daily. 05/26/21  Yes Jacky Kindle, FNP  cyclobenzaprine (FLEXERIL) 5 MG tablet TAKE 1 TABLET BY MOUTH AT BEDTIME 07/20/22  Yes Merita Norton T, FNP  DULoxetine (CYMBALTA) 60 MG capsule TAKE 1 CAPSULE BY MOUTH DAILY 06/20/22  Yes Jacky Kindle, FNP  fluticasone (FLONASE) 50 MCG/ACT nasal spray Place 1 spray into both nostrils at bedtime. 06/19/20  Yes Margaretann Loveless, PA-C  gabapentin (NEURONTIN) 300 MG capsule TAKE 1 CAPSULE BY MOUTH 3 TIMES DAILY 12/21/21  Yes Merita Norton T, FNP  meloxicam (MOBIC) 7.5 MG tablet TAKE 1 TABLET BY MOUTH DAILY 07/29/22  Yes Jacky Kindle, FNP  mirabegron ER (MYRBETRIQ) 25 MG TB24 tablet Take 1 tablet (25 mg total) by mouth daily. 02/08/22  Yes McGowan, Carollee Herter A, PA-C  montelukast (SINGULAIR) 10 MG tablet Take 1 tablet (10 mg total) by mouth at bedtime. 07/14/22  Yes Bacigalupo, Marzella Schlein, MD  nitrofurantoin, macrocrystal-monohydrate, (MACROBID) 100 MG capsule Take 1 capsule (100 mg total) by mouth 2 (two) times daily. 07/21/22  Yes McGowan, Carollee Herter A, PA-C  omeprazole (PRILOSEC) 40 MG capsule TAKE 1 CAPSULE BY MOUTH DAILY 06/13/22  Yes Merita Norton T, FNP  rOPINIRole (REQUIP) 0.25 MG tablet TAKE 1 TABLET BY MOUTH AT BEDTIME 08/04/22  Yes Jacky Kindle, FNP  rosuvastatin (CRESTOR) 40 MG tablet  Take 1 tablet (40 mg total) by mouth daily. 12/15/21  Yes Jacky Kindle, FNP  sucralfate (CARAFATE) 1 g tablet Take 1 tablet (1 g total) by mouth 4 (four) times daily -  with meals and at bedtime. 12/15/21  Yes Jacky Kindle, FNP    Family History  Problem Relation Age of Onset   Diabetes Mother    Heart attack Mother    Cancer Sister        cancer of eyes, spread to lung, other places. died at 22   Cancer Sister 26       unknown primary, spread to bone, brain died in 61's   Cancer Paternal Aunt        unk type   Cancer Paternal Grandmother        type unk   Prostate cancer Neg Hx    Kidney cancer Neg Hx    Bladder Cancer Neg Hx    Breast cancer Neg Hx      Social History   Tobacco Use   Smoking status: Former    Packs/day: 1.50    Years: 30.00    Additional pack years: 0.00    Total pack years: 45.00    Types: Cigarettes    Quit date: 05/03/1995    Years since quitting: 27.3   Smokeless tobacco: Never  Vaping Use   Vaping Use: Never used  Substance Use Topics   Alcohol use: Not Currently    Alcohol/week: 4.0  standard drinks of alcohol    Types: 4 Shots of liquor per week    Comment: none   Drug use: No    Allergies as of 08/30/2022 - Review Complete 08/30/2022  Allergen Reaction Noted   Sulfa antibiotics Other (See Comments) 10/28/2020    Review of Systems:    All systems reviewed and negative except where noted in HPI.   Physical Exam:  BP 127/83 (BP Location: Left Arm, Patient Position: Sitting, Cuff Size: Normal)   Pulse (!) 106   Temp 97.9 F (36.6 C) (Oral)   Wt 138 lb (62.6 kg)   BMI 26.07 kg/m  No LMP recorded. Patient is postmenopausal. General:   Alert,  Well-developed, well-nourished, pleasant and cooperative in NAD Head:  Normocephalic and atraumatic. Eyes:  Sclera clear, no icterus.   Conjunctiva pink. Ears:  Normal auditory acuity. Neck:  Supple; no masses or thyromegaly. Neurologic:  Alert and oriented x3;  grossly normal neurologically. Skin:  Intact without significant lesions or rashes.  No jaundice. Lymph Nodes:  No significant cervical adenopathy. Psych:  Alert and cooperative. Normal mood and affect.  Imaging Studies: No results found.  Assessment and Plan:   Denise Watts is a 67 y.o. y/o female who comes in today with a history of radiation treatment to her throat for throat cancer.  The patient now reports that she is having worsening dysphagia and choking with everything she eats.  The patient has had barium swallow with a 13 mm pill in the past which got hung up in the area of her upper esophagus.  The patient has been told that she may need dilation again and will be set up for an upper endoscopy.  The patient has been explained the risks and benefits including perforation and bleeding from the dilation.  The patient has been explained the plan and agrees with it    Midge Minium, MD. Clementeen Graham    Note: This dictation was prepared with Dragon dictation along with smaller phrase technology. Any  transcriptional errors that result from this process are  unintentional.

## 2022-08-31 ENCOUNTER — Telehealth: Payer: Self-pay | Admitting: *Deleted

## 2022-08-31 ENCOUNTER — Ambulatory Visit: Payer: Medicare PPO | Attending: Nurse Practitioner

## 2022-08-31 ENCOUNTER — Telehealth: Payer: Self-pay

## 2022-08-31 DIAGNOSIS — Z01810 Encounter for preprocedural cardiovascular examination: Secondary | ICD-10-CM

## 2022-08-31 NOTE — Telephone Encounter (Signed)
   Name: Denise Watts  DOB: 1955/11/01  MRN: [id number]  Primary Cardiologist: Debbe Odea, MD   Preoperative team, please contact this patient and set up a phone call appointment for further preoperative risk assessment. Please obtain consent and complete medication review. Thank you for your help.  I confirm that guidance regarding antiplatelet and oral anticoagulation therapy has been completed and, if necessary, noted below.  Regarding ASA therapy, it was not requested for holding but it may be stopped 5-7 days prior to surgery.  Please resume post procedure when deemed safe by requesting provider.  Napoleon Form, Leodis Rains, NP 08/31/2022, 8:07 AM [facility]

## 2022-08-31 NOTE — Progress Notes (Signed)
Virtual Visit via Telephone Note   Because of Denise Watts's co-morbid illnesses, she is at least at moderate risk for complications without adequate follow up.  This format is felt to be most appropriate for this patient at this time.  The patient did not have access to video technology/had technical difficulties with video requiring transitioning to audio format only (telephone).  All issues noted in this document were discussed and addressed.  No physical exam could be performed with this format.  Please refer to the patient's chart for her consent to telehealth for [facility].  Evaluation Performed:  Preoperative cardiovascular risk assessment _____________   Date:  08/31/2022   Patient ID:  MALEIYA PERGOLA, DOB 1955-05-10, MRN [id number] Patient Location:  Home Provider location:   Office  Primary Care Provider:  Jacky Kindle, FNP Primary Cardiologist:  Debbe Odea, MD  Chief Complaint / Patient Profile   67 y.o. y/o female with a h/o nonobstructive CAD, GERD, breast CA, SCC of vocal cord surgery and radiation therapy, previous tobacco abuse, COPD who is pending upper endoscopy and presents today for telephonic preoperative cardiovascular risk assessment.  History of Present Illness    Denise Watts is a 67 y.o. female who presents via audio/video conferencing for a telehealth visit today.  Pt was last seen in cardiology clinic on 04/22/2022 by Dr Azucena Cecil..  At that time ARLOA PRAK was doing well from a cardiac perspective.  She was dealing with bronchitis and was taking antibiotics..  The patient is now pending procedure as outlined above. Since her last visit, she is doing well from a cardiac perspective.  She is still dealing with wheezing and is being followed by her pulmonologist.  She denies chest pain, shortness of breath, lower extremity edema, fatigue, palpitations, melena, hematuria, hemoptysis, diaphoresis, weakness, presyncope, syncope, orthopnea, and  PND.    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   Cough 03/02/2017   INTERMTTENT DRY COUGH   Depression    GERD (gastroesophageal reflux disease)    History of hiatal hernia    Personal history of chemotherapy    Personal history of radiation therapy    Skin cancer    Throat cancer (HCC) 1998   radiation   Wears dentures    full upper and lower   Past Surgical History:  Procedure Laterality Date   BREAST BIOPSY Right 1997   positive   BREAST BIOPSY Right 11/15/2017   INVASIVE MAMMARY CARCINOMA triple negative   BREAST BIOPSY Right 07/20/2020   [place] bx, Q marker, neg   BREAST LUMPECTOMY Right 1997   2019 also   BREAST LUMPECTOMY WITH SENTINEL LYMPH NODE BIOPSY Right 12/08/2017   Procedure: BREAST LUMPECTOMY WITH SENTINEL LYMPH NODE BX;  Surgeon: Earline Mayotte, MD;  Location: [facility];  Service: General;  Laterality: Right;   BREAST REDUCTION WITH MASTOPEXY Left 06/10/2019   Procedure: Left breast mastopexy reduction for symmetry;  Surgeon: Peggye Form, DO;  Location: [facility];  Service: Plastics;  Laterality: Left;  total 2.5 hours   CATARACT EXTRACTION W/PHACO Right 02/07/2022   Procedure: CATARACT EXTRACTION PHACO AND INTRAOCULAR LENS PLACEMENT (IOC) RIGHT DIABETIC;  Surgeon:  Nevada Crane, MD;  Location: [facility];  Service: Ophthalmology;  Laterality: Right;  3.60 00:34.7   CATARACT EXTRACTION W/PHACO Left 02/28/2022   Procedure: CATARACT EXTRACTION PHACO AND INTRAOCULAR LENS PLACEMENT (IOC) LEFT DIABETIC 4.11 00:26.1;  Surgeon: Nevada Crane, MD;  Location: [facility];  Service: Ophthalmology;  Laterality: Left;   COLONOSCOPY  2015   COLONOSCOPY WITH PROPOFOL N/A  10/29/2019   Procedure: COLONOSCOPY WITH PROPOFOL;  Surgeon: Midge Minium, MD;  Location: [facility];  Service: Endoscopy;  Laterality: N/A;   ESOPHAGOGASTRODUODENOSCOPY (EGD) WITH PROPOFOL N/A 05/24/2019   Procedure: ESOPHAGOGASTRODUODENOSCOPY (EGD) WITH PROPOFOL;  Surgeon: Midge Minium, MD;  Location: [facility];  Service: Endoscopy;  Laterality: N/A;   ESOPHAGUS SURGERY     KNEE SURGERY Right    LIPOSUCTION WITH LIPOFILLING Right 06/10/2019   Procedure: right breast scar and capsule release with fat grafting;  Surgeon: Peggye Form, DO;  Location: [facility];  Service: Plastics;  Laterality: Right;   PORT-A-CATH REMOVAL     PORTACATH PLACEMENT Right 01/17/2018   Procedure: INSERTION PORT-A-CATH- RIGHT;  Surgeon: Earline Mayotte, MD;  Location: [facility];  Service: General;  Laterality: Right;   ROBOTIC ADRENALECTOMY Right 12/28/2016   Procedure: ROBOTIC ADRENALECTOMY;  Surgeon: Vanna Scotland, MD;  Location: [facility];  Service: Urology;  Laterality: Right;   SKIN CANCER EXCISION     THROAT SURGERY  1998   throat cancer    TUBAL LIGATION     VENTRAL HERNIA REPAIR N/A 03/03/2017   Procedure: HERNIA REPAIR VENTRAL ADULT;  Surgeon: Ricarda Frame, MD;  Location: [facility];  Service: General;  Laterality: N/A;    Allergies  Allergies  Allergen Reactions   Sulfa Antibiotics Other (See Comments)    Mouth sore and raw    Home Medications    Prior to Admission medications   Medication Sig Start Date End Date Taking? Authorizing Provider  albuterol (PROVENTIL) (2.5 MG/3ML) 0.083% nebulizer solution Take 3 mLs (2.5 mg total) by nebulization every 6 (six) hours as needed for wheezing or shortness of breath. 05/20/22   Jacky Kindle, FNP  albuterol (VENTOLIN HFA) 108 (90 Base) MCG/ACT inhaler Inhale 2 puffs into the lungs every 6 (six) hours as needed for shortness of breath. 01/29/21   Menshew, Charlesetta Ivory, PA-C  alum & mag hydroxide-simeth (MAALOX PLUS)  400-400-40 MG/5ML suspension Take 15 mLs by mouth every 6 (six) hours as needed for indigestion. 05/23/22   Jacky Kindle, FNP  aspirin EC 81 MG tablet Take 1 tablet (81 mg total) by mouth daily. Swallow whole. 12/08/21   Furth, Cadence H, PA-C  azithromycin (ZITHROMAX) 500 MG tablet     [provider]  benzonatate (TESSALON) 100 MG capsule TAKE 2 CAPSULES BY MOUTH 3 TIMES DAILY 08/04/22   Jacky Kindle, FNP  brompheniramine-pseudoephedrine-DM 30-2-10 MG/5ML syrup Take 5 mLs by mouth 4 (four) times daily as needed. 05/20/22   Jacky Kindle, FNP  Budeson-Glycopyrrol-Formoterol (BREZTRI AEROSPHERE) 160-9-4.8 MCG/ACT AERO Inhale 2 puffs into the lungs in the morning and at bedtime. 12/15/21   Jacky Kindle, FNP  buPROPion (WELLBUTRIN) 75 MG tablet Take 1 tablet (75 mg total) by mouth 2 (two) times daily. Take 75 mg by mouth 2 (two) times daily. 07/14/22   Bacigalupo, Marzella Schlein, MD  busPIRone (BUSPAR) 5 MG tablet Take 1 tablet (5 mg total) by mouth 2 (two) times daily. 05/26/21   Jacky Kindle, FNP  cyclobenzaprine (FLEXERIL) 5 MG tablet  TAKE 1 TABLET BY MOUTH AT BEDTIME 07/20/22   Merita Norton T, FNP  DULoxetine (CYMBALTA) 60 MG capsule TAKE 1 CAPSULE BY MOUTH DAILY 06/20/22   Jacky Kindle, FNP  fluticasone [facility]) 50 MCG/ACT nasal spray Place 1 spray into both nostrils at bedtime. 06/19/20   Margaretann Loveless, PA-C  gabapentin (NEURONTIN) 300 MG capsule TAKE 1 CAPSULE BY MOUTH 3 TIMES DAILY 12/21/21   Merita Norton T, FNP  meloxicam (MOBIC) 7.5 MG tablet TAKE 1 TABLET BY MOUTH DAILY 07/29/22   Jacky Kindle, FNP  mirabegron ER (MYRBETRIQ) 25 MG TB24 tablet Take 1 tablet (25 mg total) by mouth daily. 02/08/22   Michiel Cowboy A, PA-C  montelukast (SINGULAIR) 10 MG tablet Take 1 tablet (10 mg total) by mouth at bedtime. 07/14/22   Erasmo Downer, MD  nitrofurantoin, macrocrystal-monohydrate, (MACROBID) 100 MG capsule Take 1 capsule (100 mg total) by mouth 2 (two) times daily. 07/21/22    Michiel Cowboy A, PA-C  omeprazole (PRILOSEC) 40 MG capsule TAKE 1 CAPSULE BY MOUTH DAILY 06/13/22   Merita Norton T, FNP  rOPINIRole (REQUIP) 0.25 MG tablet TAKE 1 TABLET BY MOUTH AT BEDTIME 08/04/22   Jacky Kindle, FNP  rosuvastatin (CRESTOR) 40 MG tablet Take 1 tablet (40 mg total) by mouth daily. 12/15/21   Jacky Kindle, FNP  sucralfate (CARAFATE) 1 g tablet Take 1 tablet (1 g total) by mouth 4 (four) times daily -  with meals and at bedtime. 12/15/21   Jacky Kindle, FNP    Physical Exam    Vital Signs:  [name] does not have vital signs available for review today.  Given telephonic nature of communication, physical exam is limited. AAOx3. NAD. Normal affect.  Speech and respirations are unlabored.  Accessory Clinical Findings    None  Assessment & Plan    1.  Preoperative Cardiovascular Risk Assessment:  Patient is RCRI score is 0.4%  The patient affirms she has been doing well without any new cardiac symptoms. They are able to achieve 5 METS without cardiac limitations. Therefore, based on ACC/AHA guidelines, the patient would be at acceptable risk for the planned procedure without further cardiovascular testing. The patient was advised that if she develops new symptoms prior to surgery to contact our office to arrange for a follow-up visit, and she verbalized understanding.   The patient was advised that if she develops new symptoms prior to surgery to contact our office to arrange for a follow-up visit, and she verbalized understanding.  Patient can hold aspirin 5 to 7 days prior to procedure if necessary  A copy of this note will be routed to requesting surgeon.  Time:   Today, I have spent 6 minutes with the patient with telehealth technology discussing medical history, symptoms, and management plan.     Napoleon Form, Leodis Rains, NP  08/31/2022, 9:45 AM

## 2022-08-31 NOTE — Telephone Encounter (Signed)
-----   Message from Gaston Islam., NP sent at 08/30/2022  4:31 PM EDT ----- 08/30/2022  URGENT  Patient Name: Denise Watts  DOB: 1955/10/28  To whom it may concern:  [name] has been scheduled for an EGD on Sep 06, 2022, under GENERAL anesthesia.  Please FAX a note of Medical Clearance to [phone], ATTENTION: MELANIE  To advise if this patient will require an office visit or further medical work-up before clearance can be given please call [phone] (main number) and leave a message with the triage nurse.  Thank you    [facility]   _____ Patient is cleared to have procedure    Additional notes:_________________________________________________________  ______________________________________________________________________   _______________________________________ ________________ Physician Signature     Date

## 2022-08-31 NOTE — Telephone Encounter (Signed)
Pt has been scheduled for tele visit pre op add on per pre op APP today Robin Searing, NP. Pt's procedure is 09/06/22. Pt did take her ASA today. I had instructed the pt at this time to not take anymore ASA until post procedure and Dr. Servando Snare will let her know when it is safe to resume ASA.   Med rec and consent are done.     Patient Consent for Virtual Visit        AUBERY DATE has provided verbal consent on 08/31/2022 for a virtual visit (video or telephone).   CONSENT FOR VIRTUAL VISIT FOR:  Christean Leaf Garcilazo  By participating in this virtual visit I agree to the following:  I hereby voluntarily request, consent and authorize [facility] and its employed or contracted physicians, physician assistants, nurse practitioners or other licensed health care professionals (the Practitioner), to provide me with telemedicine health care services (the "Services") as deemed necessary by the treating Practitioner. I acknowledge and consent to receive the Services by the Practitioner via telemedicine. I understand that the telemedicine visit will involve communicating with the Practitioner through live audiovisual communication technology and the disclosure of certain medical information by electronic transmission. I acknowledge that I have been given the opportunity to request an in-person assessment or other available alternative prior to the telemedicine visit and am voluntarily participating in the telemedicine visit.  I understand that I have the right to withhold or withdraw my consent to the use of telemedicine in the course of my care at any time, without affecting my right to future care or treatment, and that the Practitioner or I may terminate the telemedicine visit at any time. I understand that I have the right to inspect all information obtained and/or recorded in the course of the telemedicine visit and may receive copies of available information for a reasonable fee.  I understand that some of  the potential risks of receiving the Services via telemedicine include:  Delay or interruption in medical evaluation due to technological equipment failure or disruption; Information transmitted may not be sufficient (e.g. poor resolution of images) to allow for appropriate medical decision making by the Practitioner; and/or  In rare instances, security protocols could fail, causing a breach of personal health information.  Furthermore, I acknowledge that it is my responsibility to provide information about my medical history, conditions and care that is complete and accurate to the best of my ability. I acknowledge that Practitioner's advice, recommendations, and/or decision may be based on factors not within their control, such as incomplete or inaccurate data provided by me or distortions of diagnostic images or specimens that may result from electronic transmissions. I understand that the practice of medicine is not an exact science and that Practitioner makes no warranties or guarantees regarding treatment outcomes. I acknowledge that a copy of this consent can be made available to me via my patient portal [facility] MyChart), or I can request a printed copy by calling the office of [facility].    I understand that my insurance will be billed for this visit.   I have read or had this consent read to me. I understand the contents of this consent, which adequately explains the benefits and risks of the Services being provided via telemedicine.  I have been provided ample opportunity to ask questions regarding this consent and the Services and have had my questions answered to my satisfaction. I give my informed consent for the services to be  provided through the use of telemedicine in my medical care

## 2022-08-31 NOTE — Telephone Encounter (Signed)
Pt has been scheduled for tele visit pre op add on per pre op APP today Robin Searing, NP. Pt's procedure is 09/06/22. Pt did take her ASA today. I had instructed the pt at this time to not take anymore ASA until post procedure and Dr. Servando Snare will let her know when it is safe to resume ASA.    Med rec and consent are done.

## 2022-08-31 NOTE — Telephone Encounter (Signed)
   Pre-operative Risk Assessment    Patient Name: Denise Watts  DOB: 1956/04/11 MRN: [id number]      Request for Surgical Clearance    Procedure:   EGD  Date of Surgery:  Clearance 09/06/22                                 Surgeon:  Dr Midge Minium Surgeon's Group or Practice Name:  [facility] Phone number:  [phone] Fax number:  [phone]        Type of Clearance Requested:   - Medical    Type of Anesthesia:  General    Additional requests/questions:   N/A  SignedMelina Schools   08/31/2022, 7:45 AM

## 2022-09-01 ENCOUNTER — Telehealth: Payer: Self-pay

## 2022-09-01 NOTE — Telephone Encounter (Signed)
Pulm clearance received

## 2022-09-01 NOTE — Telephone Encounter (Signed)
The patient affirms she has been doing well without any new cardiac symptoms. They are able to achieve 5 METS without cardiac limitations. Therefore, based on ACC/AHA guidelines, the patient would be at acceptable risk for the planned procedure without further cardiovascular testing. The patient was advised that if she develops new symptoms prior to surgery to contact our office to arrange for a follow-up visit, and she verbalized understanding.    The patient was advised that if she develops new symptoms prior to surgery to contact our office to arrange for a follow-up visit, and she verbalized understanding.   Patient can hold aspirin 5 to 7 days prior to procedure if necessary

## 2022-09-02 ENCOUNTER — Encounter: Payer: Self-pay | Admitting: Oncology

## 2022-09-02 DIAGNOSIS — J449 Chronic obstructive pulmonary disease, unspecified: Secondary | ICD-10-CM | POA: Diagnosis not present

## 2022-09-02 DIAGNOSIS — U071 COVID-19: Secondary | ICD-10-CM | POA: Diagnosis not present

## 2022-09-06 ENCOUNTER — Ambulatory Visit: Payer: Medicare PPO | Admitting: Certified Registered"

## 2022-09-06 ENCOUNTER — Ambulatory Visit
Admission: RE | Admit: 2022-09-06 | Discharge: 2022-09-06 | Disposition: A | Discharge status: Home / Self Care | Payer: Medicare PPO | Attending: Gastroenterology | Admitting: Gastroenterology

## 2022-09-06 ENCOUNTER — Encounter
Admission: RE | Disposition: A | Discharge status: Home / Self Care | Payer: Self-pay | Source: Home / Self Care | Attending: Gastroenterology

## 2022-09-06 ENCOUNTER — Encounter: Payer: Self-pay | Admitting: Gastroenterology

## 2022-09-06 DIAGNOSIS — Z08 Encounter for follow-up examination after completed treatment for malignant neoplasm: Secondary | ICD-10-CM | POA: Insufficient documentation

## 2022-09-06 DIAGNOSIS — K219 Gastro-esophageal reflux disease without esophagitis: Secondary | ICD-10-CM | POA: Insufficient documentation

## 2022-09-06 DIAGNOSIS — J449 Chronic obstructive pulmonary disease, unspecified: Secondary | ICD-10-CM | POA: Diagnosis not present

## 2022-09-06 DIAGNOSIS — Z853 Personal history of malignant neoplasm of breast: Secondary | ICD-10-CM | POA: Insufficient documentation

## 2022-09-06 DIAGNOSIS — F419 Anxiety disorder, unspecified: Secondary | ICD-10-CM | POA: Insufficient documentation

## 2022-09-06 DIAGNOSIS — Z801 Family history of malignant neoplasm of trachea, bronchus and lung: Secondary | ICD-10-CM | POA: Diagnosis not present

## 2022-09-06 DIAGNOSIS — Z8521 Personal history of malignant neoplasm of larynx: Secondary | ICD-10-CM | POA: Diagnosis not present

## 2022-09-06 DIAGNOSIS — F32A Depression, unspecified: Secondary | ICD-10-CM | POA: Diagnosis not present

## 2022-09-06 DIAGNOSIS — R1310 Dysphagia, unspecified: Secondary | ICD-10-CM | POA: Insufficient documentation

## 2022-09-06 DIAGNOSIS — I2510 Atherosclerotic heart disease of native coronary artery without angina pectoris: Secondary | ICD-10-CM | POA: Diagnosis not present

## 2022-09-06 DIAGNOSIS — R1319 Other dysphagia: Secondary | ICD-10-CM | POA: Diagnosis not present

## 2022-09-06 DIAGNOSIS — Z87891 Personal history of nicotine dependence: Secondary | ICD-10-CM | POA: Diagnosis not present

## 2022-09-06 DIAGNOSIS — K449 Diaphragmatic hernia without obstruction or gangrene: Secondary | ICD-10-CM | POA: Diagnosis not present

## 2022-09-06 DIAGNOSIS — Z9221 Personal history of antineoplastic chemotherapy: Secondary | ICD-10-CM | POA: Diagnosis not present

## 2022-09-06 DIAGNOSIS — Z09 Encounter for follow-up examination after completed treatment for conditions other than malignant neoplasm: Secondary | ICD-10-CM | POA: Insufficient documentation

## 2022-09-06 DIAGNOSIS — K222 Esophageal obstruction: Secondary | ICD-10-CM | POA: Diagnosis not present

## 2022-09-06 DIAGNOSIS — Z923 Personal history of irradiation: Secondary | ICD-10-CM | POA: Diagnosis not present

## 2022-09-06 DIAGNOSIS — Z9981 Dependence on supplemental oxygen: Secondary | ICD-10-CM | POA: Insufficient documentation

## 2022-09-06 DIAGNOSIS — B3781 Candidal esophagitis: Secondary | ICD-10-CM | POA: Diagnosis not present

## 2022-09-06 DIAGNOSIS — Z85828 Personal history of other malignant neoplasm of skin: Secondary | ICD-10-CM | POA: Insufficient documentation

## 2022-09-06 HISTORY — PX: ESOPHAGOGASTRODUODENOSCOPY (EGD) WITH PROPOFOL: SHX5813

## 2022-09-06 LAB — KOH PREP

## 2022-09-06 SURGERY — ESOPHAGOGASTRODUODENOSCOPY (EGD) WITH PROPOFOL
Anesthesia: General

## 2022-09-06 MED ORDER — SODIUM CHLORIDE 0.9 % IV SOLN: INTRAVENOUS | Status: DC

## 2022-09-06 MED ORDER — LIDOCAINE HCL (CARDIAC) PF 100 MG/5ML IV SOSY
PREFILLED_SYRINGE | INTRAVENOUS | Status: DC | PRN
  Administered 2022-09-06: 100 mg via INTRAVENOUS

## 2022-09-06 MED ORDER — PROPOFOL 10 MG/ML IV BOLUS
INTRAVENOUS | Status: DC | PRN
  Administered 2022-09-06 (×4): 10 mg via INTRAVENOUS
  Administered 2022-09-06: 80 mg via INTRAVENOUS

## 2022-09-06 NOTE — Transfer of Care (Signed)
Immediate Anesthesia Transfer of Care Note  Patient: Denise Watts  Procedure(s) Performed: ESOPHAGOGASTRODUODENOSCOPY (EGD) WITH PROPOFOL  Patient Location: Endoscopy Unit  Anesthesia Type:General  Level of Consciousness: drowsy  Airway & Oxygen Therapy: Patient Spontanous Breathing  Post-op Assessment: Report given to RN and Post -op Vital signs reviewed and stable  Post vital signs: Reviewed and stable  Last Vitals:  Vitals Value Taken Time  BP 123/69 09/06/22 0909  Temp 36.1 C 09/06/22 0909  Pulse 85 09/06/22 0909  Resp 17 09/06/22 0909  SpO2 98 % 09/06/22 0909    Last Pain:  Vitals:   09/06/22 0909  TempSrc: Temporal  PainSc: Asleep         Complications: No notable events documented.

## 2022-09-06 NOTE — H&P (Signed)
Midge Minium, MD [facility] [address] [place], [place] [address] Phone:[phone] Fax : [phone]  Primary Care Physician:  Jacky Kindle, FNP Primary Gastroenterologist:  Dr. Servando Snare  Pre-Procedure History & Physical: HPI:  Denise Watts is a 67 y.o. female is here for an endoscopy.   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   Cough 03/02/2017   INTERMTTENT DRY COUGH   Depression    GERD (gastroesophageal reflux disease)    History of hiatal hernia    Personal history of chemotherapy    Personal history of radiation therapy    Skin cancer    Throat cancer (HCC) 1998   radiation   Wears dentures    full upper and lower    Past Surgical History:  Procedure Laterality Date   BREAST BIOPSY Right 1997   positive   BREAST BIOPSY Right 11/15/2017   INVASIVE MAMMARY CARCINOMA triple negative   BREAST BIOPSY Right 07/20/2020   [place] bx, Q marker, neg   BREAST LUMPECTOMY Right 1997   2019 also   BREAST LUMPECTOMY WITH SENTINEL LYMPH NODE BIOPSY Right 12/08/2017   Procedure: BREAST LUMPECTOMY WITH SENTINEL LYMPH NODE BX;  Surgeon: Earline Mayotte, MD;  Location: [facility];  Service: General;  Laterality: Right;   BREAST REDUCTION WITH MASTOPEXY Left 06/10/2019   Procedure: Left breast mastopexy reduction for symmetry;  Surgeon: Peggye Form, DO;  Location: [facility];  Service: Plastics;  Laterality: Left;  total 2.5 hours   CATARACT EXTRACTION W/PHACO Right 02/07/2022   Procedure: CATARACT EXTRACTION PHACO AND INTRAOCULAR LENS PLACEMENT (IOC) RIGHT DIABETIC;  Surgeon: Nevada Crane, MD;  Location: [facility];  Service: Ophthalmology;  Laterality: Right;  3.60 00:34.7   CATARACT EXTRACTION W/PHACO Left  02/28/2022   Procedure: CATARACT EXTRACTION PHACO AND INTRAOCULAR LENS PLACEMENT (IOC) LEFT DIABETIC 4.11 00:26.1;  Surgeon: Nevada Crane, MD;  Location: [facility];  Service: Ophthalmology;  Laterality: Left;   COLONOSCOPY  2015   COLONOSCOPY WITH PROPOFOL N/A 10/29/2019   Procedure: COLONOSCOPY WITH PROPOFOL;  Surgeon: Midge Minium, MD;  Location: [facility];  Service: Endoscopy;  Laterality: N/A;   ESOPHAGOGASTRODUODENOSCOPY (EGD) WITH PROPOFOL N/A 05/24/2019   Procedure: ESOPHAGOGASTRODUODENOSCOPY (EGD) WITH PROPOFOL;  Surgeon: Midge Minium, MD;  Location: [facility];  Service: Endoscopy;  Laterality: N/A;   ESOPHAGUS SURGERY     KNEE SURGERY Right    LIPOSUCTION WITH LIPOFILLING Right 06/10/2019   Procedure: right breast scar and capsule release with fat grafting;  Surgeon: Peggye Form, DO;  Location: [facility];  Service: Plastics;  Laterality: Right;   PORT-A-CATH REMOVAL     PORTACATH PLACEMENT Right 01/17/2018   Procedure: INSERTION PORT-A-CATH- RIGHT;  Surgeon: Earline Mayotte, MD;  Location: [facility];  Service: General;  Laterality: Right;   ROBOTIC ADRENALECTOMY Right 12/28/2016   Procedure: ROBOTIC ADRENALECTOMY;  Surgeon: Vanna Scotland, MD;  Location: [facility];  Service: Urology;  Laterality: Right;   SKIN CANCER EXCISION     THROAT SURGERY  1998   throat cancer    TUBAL LIGATION     VENTRAL HERNIA REPAIR N/A 03/03/2017   Procedure: HERNIA REPAIR VENTRAL ADULT;  Surgeon: Ricarda Frame, MD;  Location:  [facility];  Service: General;  Laterality: N/A;    Prior to Admission medications   Medication Sig Start Date End Date Taking? Authorizing Provider  aspirin EC 81 MG tablet Take 1 tablet (81 mg total) by mouth daily. Swallow whole. 12/08/21  Yes Furth, Cadence H, PA-C  Budeson-Glycopyrrol-Formoterol (BREZTRI AEROSPHERE) 160-9-4.8 MCG/ACT AERO Inhale 2 puffs into the lungs in the morning and at bedtime. 12/15/21  Yes Jacky Kindle, FNP  buPROPion (WELLBUTRIN) 75 MG tablet Take 1 tablet (75 mg total) by mouth 2 (two) times daily. Take 75 mg by mouth 2 (two) times daily. 07/14/22  Yes Bacigalupo, Marzella Schlein, MD  busPIRone (BUSPAR) 5 MG tablet Take 1 tablet (5 mg total) by mouth 2 (two) times daily. 05/26/21  Yes Merita Norton T, FNP  DULoxetine (CYMBALTA) 60 MG capsule TAKE 1 CAPSULE BY MOUTH DAILY 06/20/22  Yes Jacky Kindle, FNP  fluticasone Clear [facility]) 50 MCG/ACT nasal spray Place 1 spray into both nostrils at bedtime. 06/19/20  Yes Margaretann Loveless, PA-C  meloxicam (MOBIC) 7.5 MG tablet TAKE 1 TABLET BY MOUTH DAILY 07/29/22  Yes Merita Norton T, FNP  montelukast (SINGULAIR) 10 MG tablet Take 1 tablet (10 mg total) by mouth at bedtime. 07/14/22  Yes Bacigalupo, Marzella Schlein, MD  rosuvastatin (CRESTOR) 40 MG tablet Take 1 tablet (40 mg total) by mouth daily. 12/15/21  Yes Merita Norton T, FNP  albuterol (PROVENTIL) (2.5 MG/3ML) 0.083% nebulizer solution Take 3 mLs (2.5 mg total) by nebulization every 6 (six) hours as needed for wheezing or shortness of breath. 05/20/22   Jacky Kindle, FNP  albuterol (VENTOLIN HFA) 108 (90 Base) MCG/ACT inhaler Inhale 2 puffs into the lungs every 6 (six) hours as needed for shortness of breath. 01/29/21   Menshew, Charlesetta Ivory, PA-C  alum & mag hydroxide-simeth (MAALOX PLUS) 400-400-40 MG/5ML suspension Take 15 mLs by mouth every 6 (six) hours as needed for indigestion. 05/23/22   Jacky Kindle, FNP  azithromycin (ZITHROMAX) 500 MG tablet     [provider]  benzonatate (TESSALON) 100 MG capsule TAKE 2 CAPSULES BY MOUTH 3 TIMES DAILY 08/04/22   Jacky Kindle, FNP  brompheniramine-pseudoephedrine-DM 30-2-10 MG/5ML syrup Take 5 mLs by mouth 4 (four) times daily as needed. 05/20/22   Jacky Kindle, FNP  cyclobenzaprine (FLEXERIL) 5 MG tablet TAKE 1 TABLET BY MOUTH AT BEDTIME 07/20/22   Merita Norton T, FNP  gabapentin (NEURONTIN) 300 MG capsule TAKE 1 CAPSULE BY MOUTH 3 TIMES DAILY 12/21/21   Jacky Kindle, FNP  mirabegron ER (MYRBETRIQ) 25 MG TB24 tablet Take 1 tablet (25 mg total) by mouth daily. 02/08/22   McGowan, Wellington Hampshire, PA-C  nitrofurantoin, macrocrystal-monohydrate, (MACROBID) 100 MG capsule Take 1 capsule (100 mg total) by mouth 2 (two) times daily. Patient not taking: Reported on 08/31/2022 07/21/22   Michiel Cowboy A, PA-C  omeprazole (PRILOSEC) 40 MG capsule TAKE 1 CAPSULE BY MOUTH DAILY 06/13/22   Merita Norton T, FNP  rOPINIRole (REQUIP) 0.25 MG tablet TAKE 1 TABLET BY MOUTH AT BEDTIME 08/04/22   Jacky Kindle, FNP  sucralfate (CARAFATE) 1 g tablet Take 1 tablet (1 g total) by mouth 4 (four) times daily -  with meals and at bedtime. 12/15/21   Jacky Kindle, FNP    Allergies as of 08/30/2022 - Review Complete 08/30/2022  Allergen Reaction Noted   Sulfa antibiotics Other (See Comments) 10/28/2020    Family History  Problem Relation Age of Onset  Diabetes Mother    Heart attack Mother    Cancer Sister        cancer of eyes, spread to lung, other places. died at 67   Cancer Sister 8       unknown primary, spread to bone, brain died in 103's   Cancer Paternal Aunt        unk type   Cancer Paternal Grandmother        type unk   Prostate cancer Neg Hx    Kidney cancer Neg Hx    Bladder Cancer Neg Hx    Breast cancer Neg Hx     Social History   Socioeconomic History   Marital status: Married    Spouse name: Not on file   Number of children: 4   Years of education: Not on file   Highest education level: 10th grade  Occupational History   Occupation: retired  Tobacco Use   Smoking status: Former    Packs/day: 1.50    Years: 30.00    Additional pack years: 0.00    Total pack years: 45.00    Types: Cigarettes    Quit date: 05/03/1995    Years since quitting: 27.3   Smokeless tobacco: Never  Vaping Use   Vaping Use: Never used  Substance and Sexual Activity   Alcohol use: Not Currently    Alcohol/week: 4.0 standard drinks of alcohol    Types: 4 Shots of  liquor per week    Comment: none   Drug use: No   Sexual activity: Not Currently    Birth control/protection: Post-menopausal  Other Topics Concern   Not on file  Social History Narrative   Not on file   Social Determinants of Health   Financial Resource Strain: Low Risk  (12/02/2021)   Overall Financial Resource Strain (CARDIA)    Difficulty of Paying Living Expenses: Not very hard  Food Insecurity: No Food Insecurity (12/02/2021)   Hunger Vital Sign    Worried About Running Out of Food in the Last Year: Never true    Ran Out of Food in the Last Year: Never true  Transportation Needs: No Transportation Needs (12/02/2021)   PRAPARE - Administrator, Civil Service (Medical): No    Lack of Transportation (Non-Medical): No  Physical Activity: Insufficiently Active (12/02/2021)   Exercise Vital Sign    Days of Exercise per Week: 3 days    Minutes of Exercise per Session: 30 min  Stress: Stress Concern Present (12/02/2021)   [employer] of Occupational Health - Occupational Stress Questionnaire    Feeling of Stress : To some extent  Social Connections: Moderately Isolated (12/02/2021)   Social Connection and Isolation Panel [NHANES]    Frequency of Communication with Friends and Family: Three times a week    Frequency of Social Gatherings with Friends and Family: Twice a week    Attends Religious Services: Never    Database administrator or Organizations: No    Attends Banker Meetings: Never    Marital Status: Married  Catering manager Violence: Not At Risk (12/02/2021)   Humiliation, Afraid, Rape, and Kick questionnaire    Fear of Current or Ex-Partner: No    Emotionally Abused: No    Physically Abused: No    Sexually Abused: No    Review of Systems: See HPI, otherwise negative ROS  Physical Exam: BP 114/67   Pulse 88   Temp (!) 97.3 F (36.3 C) (Temporal)   Resp  17   Wt 63.7 kg   SpO2 98%   BMI 26.53 kg/m  General:   Alert,  pleasant and  cooperative in NAD Head:  Normocephalic and atraumatic. Neck:  Supple; no masses or thyromegaly. Lungs:  Clear throughout to auscultation.    Heart:  Regular rate and rhythm. Abdomen:  Soft, nontender and nondistended. Normal bowel sounds, without guarding, and without rebound.   Neurologic:  Alert and  oriented x4;  grossly normal neurologically.  Impression/Plan: Denise Watts is here for an endoscopy to be performed for dysphagia  Risks, benefits, limitations, and alternatives regarding  endoscopy have been reviewed with the patient.  Questions have been answered.  All parties agreeable.   Midge Minium, MD  09/06/2022, 8:52 AM

## 2022-09-06 NOTE — Anesthesia Preprocedure Evaluation (Signed)
Anesthesia Evaluation  Patient identified by MRN, date of birth, ID band Patient awake    Reviewed: Allergy & Precautions, H&P , NPO status , Patient's Chart, lab work & pertinent test results, reviewed documented beta blocker date and time   History of Anesthesia Complications Negative for: history of anesthetic complications  Airway Mallampati: II  TM Distance: >3 FB Neck ROM: full    Dental  (+) Dental Advidsory Given, Lower Dentures, Upper Dentures   Pulmonary shortness of breath and with exertion, COPD,  oxygen dependent, neg recent URI, former smoker   Pulmonary exam normal        Cardiovascular Exercise Tolerance: Good (-) hypertension(-) angina + CAD  (-) Past MI Normal cardiovascular exam(-) dysrhythmias (-) Valvular Problems/Murmurs Rhythm:regular Rate:Normal     Neuro/Psych neg Seizures PSYCHIATRIC DISORDERS Anxiety Depression     Neuromuscular disease    GI/Hepatic Neg liver ROS, hiatal hernia,GERD  Medicated,,  Endo/Other  negative endocrine ROS    Renal/GU negative Renal ROS  negative genitourinary   Musculoskeletal   Abdominal   Peds  Hematology negative hematology ROS (+)   Anesthesia Other Findings H/o vocal cord cancer s/p radiation  Past Medical History: No date: Arthritis     Comment:  KNEE RIGHT 1997: Breast cancer (HCC)     Comment:  right breast lumpectomy with radiation 11/15/2017: Breast cancer (HCC)     Comment:  INVASIVE MAMMARY CARCINOMA/ triple negative 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) 03/02/2017: Cough     Comment:  INTERMTTENT DRY COUGH No date: GERD (gastroesophageal reflux disease) No date: History of hiatal hernia No date: Personal history of chemotherapy No date: Personal history of radiation therapy No date: Skin cancer 1998: Throat cancer [facility])     Comment:  radiation Past Surgical History: 1997: BREAST BIOPSY; Right     Comment:  positive 11/15/2017: BREAST  BIOPSY; Right     Comment:  INVASIVE MAMMARY CARCINOMA triple negative 1997: BREAST LUMPECTOMY; Right     Comment:  2019 also 12/08/2017: BREAST LUMPECTOMY WITH SENTINEL LYMPH NODE BIOPSY; Right     Comment:  Procedure: BREAST LUMPECTOMY WITH SENTINEL LYMPH NODE               BX;  Surgeon: Earline Mayotte, MD;  Location: [facility]               ORS;  Service: General;  Laterality: Right; 2015: COLONOSCOPY No date: ESOPHAGUS SURGERY No date: KNEE SURGERY; Right No date: PORT-A-CATH REMOVAL 01/17/2018: PORTACATH PLACEMENT; Right     Comment:  Procedure: INSERTION PORT-A-CATH- RIGHT;  Surgeon:               Earline Mayotte, MD;  Location: [facility];  Service:               General;  Laterality: Right; 12/28/2016: ROBOTIC ADRENALECTOMY; Right     Comment:  Procedure: ROBOTIC ADRENALECTOMY;  Surgeon: Vanna Scotland, MD;  Location: [facility];  Service: Urology;                Laterality: Right; No date: SKIN CANCER EXCISION 1998: THROAT SURGERY     Comment:  throat cancer  No date: TUBAL LIGATION 03/03/2017: VENTRAL HERNIA REPAIR; N/A     Comment:  Procedure: HERNIA REPAIR VENTRAL ADULT;  Surgeon:               Ricarda Frame, MD;  Location: [facility] ORS;  Service:               General;  Laterality: N/A; BMI    Body Mass Index: 23.43 kg/m     Reproductive/Obstetrics negative OB ROS                             Anesthesia Physical Anesthesia Plan  ASA: 3  Anesthesia Plan: General   Post-op Pain Management: Minimal or no pain anticipated   Induction: Intravenous  PONV Risk Score and Plan: Propofol infusion and TIVA  Airway Management Planned: Natural Airway and Nasal Cannula  Additional Equipment:   Intra-op Plan:   Post-operative Plan:   Informed Consent: I have reviewed the patients History and Physical, chart, labs and discussed the procedure including the risks, benefits and alternatives for the proposed anesthesia with the patient or  authorized representative who has indicated his/her understanding and acceptance.     Dental Advisory Given  Plan Discussed with: CRNA  Anesthesia Plan Comments: (Patient consented for risks of anesthesia including but not limited to:  - adverse reactions to medications - risk of airway placement if required - damage to eyes, teeth, lips or other oral mucosa - nerve damage due to positioning  - sore throat or hoarseness - Damage to heart, brain, nerves, lungs, other parts of body or loss of life  Patient voiced understanding.)        Anesthesia Quick Evaluation

## 2022-09-06 NOTE — Op Note (Signed)
[facility] Patient Name: Denise Watts Procedure Date: 09/06/2022 8:47 AM MRN: [id number] Account #: [id number] Date of Birth: [birth year] Admit Type: Outpatient Age: 67 Room: [facility] ENDO ROOM 4 Gender: Female Note Status: Finalized Instrument Name: Patton Salles Endoscope [id number] Procedure:             Upper GI endoscopy Indications:           Dysphagia Providers:             Midge Minium MD, MD Referring MD:          Daryl Eastern. Suzie Portela (Referring MD) Medicines:             Propofol per Anesthesia Complications:         No immediate complications. Procedure:             Pre-Anesthesia Assessment:                        - Prior to the procedure, a History and Physical was                         performed, and patient medications and allergies were                         reviewed. The patient's tolerance of previous                         anesthesia was also reviewed. The risks and benefits                         of the procedure and the sedation options and risks                         were discussed with the patient. All questions were                         answered, and informed consent was obtained. Prior                         Anticoagulants: The patient has taken no anticoagulant                         or antiplatelet agents. ASA Grade Assessment: II - A                         patient with mild systemic disease. After reviewing                         the risks and benefits, the patient was deemed in                         satisfactory condition to undergo the procedure.                        After obtaining informed consent, the endoscope was                         passed under direct vision. Throughout the procedure,  the patient's blood pressure, pulse, and oxygen                         saturations were monitored continuously. The Endoscope                         was introduced through the mouth, and advanced to the                          second part of duodenum. The upper GI endoscopy was                         accomplished without difficulty. The patient tolerated                         the procedure well. Findings:      Diffuse, white plaques were found in the entire esophagus.      One benign-appearing, intrinsic moderate stenosis was found in the upper       third of the esophagus. The stenosis was traversed. A TTS dilator was       passed through the scope. Dilation with a 02-10-11 mm balloon dilator       was performed to 12 mm.      The stomach was normal.      The examined duodenum was normal. Impression:            - Esophageal plaques were found, consistent with                         candidiasis.                        - Benign-appearing esophageal stenosis. Dilated.                        - Normal stomach.                        - Normal examined duodenum.                        - No specimens collected. Recommendation:        - Discharge patient to home.                        - Resume previous diet.                        - Repeat upper endoscopy in 4 weeks for retreatment. Procedure Code(s):     --- Professional ---                        [phone], Esophagogastroduodenoscopy, flexible,                         transoral; with transendoscopic balloon dilation of                         esophagus (less than 30 mm diameter) Diagnosis Code(s):     --- Professional ---  R13.10, Dysphagia, unspecified                        K22.2, Esophageal obstruction CPT copyright 2022 [employer]. All rights reserved. The codes documented in this report are preliminary and upon coder review may  be revised to meet current compliance requirements. Midge Minium MD, MD 09/06/2022 9:10:35 AM This report has been signed electronically. Number of Addenda: 0 Note Initiated On: 09/06/2022 8:47 AM Estimated Blood Loss:  Estimated blood loss: none.      [facility]

## 2022-09-07 ENCOUNTER — Other Ambulatory Visit: Payer: Self-pay

## 2022-09-07 ENCOUNTER — Encounter: Payer: Self-pay | Admitting: Gastroenterology

## 2022-09-07 MED ORDER — FLUCONAZOLE 100 MG PO TABS: 100.0000 mg | ORAL_TABLET | Freq: Every day | ORAL | 0 refills | Status: DC

## 2022-09-10 NOTE — Anesthesia Postprocedure Evaluation (Signed)
Anesthesia Post Note  Patient: Denise Watts  Procedure(s) Performed: ESOPHAGOGASTRODUODENOSCOPY (EGD) WITH PROPOFOL  Patient location during evaluation: Endoscopy Anesthesia Type: General Level of consciousness: awake and alert Pain management: pain level controlled Vital Signs Assessment: post-procedure vital signs reviewed and stable Respiratory status: spontaneous breathing, nonlabored ventilation, respiratory function stable and patient connected to nasal cannula oxygen Cardiovascular status: blood pressure returned to baseline and stable Postop Assessment: no apparent nausea or vomiting Anesthetic complications: no   No notable events documented.   Last Vitals:  Vitals:   09/06/22 0927 09/06/22 0929  BP: (!) 110/94 131/75  Pulse: 73 77  Resp: 12 20  Temp:    SpO2: 98% 98%    Last Pain:  Vitals:   09/06/22 0929  TempSrc:   PainSc: 0-No pain                 Lenard Simmer

## 2022-09-13 NOTE — Progress Notes (Unsigned)
09/14/2022 9:43 AM   Denise Watts 01-22-1956 [id number]  Referring provider: Jacky Kindle, FNP [address] [place],  [place] [address]  Urological history: 1. OAB wet -contributing factors of age, smoking, vaginal atrophy, COPD, anxiety, depression, parkinsonism, pre-diabetic and muscle relaxer's -cysto 2024- NED -failed Gemtesa 75 mg daily   2. Stress incontinence -contributing factors of age and vaginal atrophy   3. Right adrenal mass -s/p right adrenalectomy 2018 - pathology positive for high grade carcinoma with extensive necrosis - surgical margins negative  -followed by the Cancer center  4. High risk hematuria -former smoker -contrast CT (11/2021) - indeterminate 1.5 cm left renal lesion -MRI (01/2022) - 1.5 cm Bosniak II cyst in left kidney -cysto (08/2022) - NED  Chief Complaint  Patient presents with   Other    HPI: Denise Watts is a 67 y.o. female who presents today for follow up.   At her visit on 07/21/2022, she had been having 2 weeks of urge incontinence with painful urination.  She was currently on a Z-Pak for sinus infection.  Patient denied any modifying or aggravating factors.  Patient denied any gross hematuria or suprapubic/flank pain.  Patient denied any fevers, chills, nausea or vomiting.  She also stated that she had been having a slimy vaginal discharge every morning for the last several weeks.  She had started to experience vaginal itching over the last week.   She was seen by gynecology back in August 2023 for similar complaints and wet prep was negative at that time.  It stated in the note that she was to follow-up with the MD in 2 weeks, but that appointment was not completed.  UA yellow slightly cloudy, specific gravity 1.025, 1+ blood, pH 5.5, 2+ protein, 1+ leukocyte, greater than 30 WBCs, 11-30 RBCs, 0-10 epithelial cells, granular casts are present, mucus threads are present and moderate bacteria.  PVR 33 mL.  She underwent a  cystoscopy for further evaluation of her microscopic hematuria with Dr. Apolinar Junes on August 18, 2022 and it was normal.  She was referred to gynecology for further evaluation of her vaginal discharge, but she has not had that appointment.  She is having 1-7 voids, nocturia 3 or more with a severe urge to urinate.  She was having urinary leakage and both stress and urge.  She is leaking 3 or more times daily.  She is going through 5 to 6 depends daily.  She does not limit fluid intake.  She does engage in toilet mapping.   She has been experiencing intense urgency with urge incontinence for several weeks now.    She is not on any OAB meds at this time.   Patient denies any modifying or aggravating factors.  Patient denies any gross hematuria or suprapubic/flank pain.  Patient denies any fevers, chills, nausea or vomiting.        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   Cough 03/02/2017   INTERMTTENT DRY COUGH   Depression    GERD (gastroesophageal reflux disease)    History of hiatal hernia    Personal history of chemotherapy    Personal history of radiation therapy    Skin cancer    Throat cancer (HCC)  1998   radiation   Wears dentures    full upper and lower    Surgical History: Past Surgical History:  Procedure Laterality Date   BREAST BIOPSY Right 1997   positive   BREAST BIOPSY Right 11/15/2017   INVASIVE MAMMARY CARCINOMA triple negative   BREAST BIOPSY Right 07/20/2020   [place] bx, Q marker, neg   BREAST LUMPECTOMY Right 1997   2019 also   BREAST LUMPECTOMY WITH SENTINEL LYMPH NODE BIOPSY Right 12/08/2017   Procedure: BREAST LUMPECTOMY WITH SENTINEL LYMPH NODE BX;  Surgeon: Earline Mayotte, MD;  Location: [facility];  Service: General;   Laterality: Right;   BREAST REDUCTION WITH MASTOPEXY Left 06/10/2019   Procedure: Left breast mastopexy reduction for symmetry;  Surgeon: Peggye Form, DO;  Location: [facility];  Service: Plastics;  Laterality: Left;  total 2.5 hours   CATARACT EXTRACTION W/PHACO Right 02/07/2022   Procedure: CATARACT EXTRACTION PHACO AND INTRAOCULAR LENS PLACEMENT (IOC) RIGHT DIABETIC;  Surgeon: Nevada Crane, MD;  Location: [facility];  Service: Ophthalmology;  Laterality: Right;  3.60 00:34.7   CATARACT EXTRACTION W/PHACO Left 02/28/2022   Procedure: CATARACT EXTRACTION PHACO AND INTRAOCULAR LENS PLACEMENT (IOC) LEFT DIABETIC 4.11 00:26.1;  Surgeon: Nevada Crane, MD;  Location: [facility];  Service: Ophthalmology;  Laterality: Left;   COLONOSCOPY  2015   COLONOSCOPY WITH PROPOFOL N/A 10/29/2019   Procedure: COLONOSCOPY WITH PROPOFOL;  Surgeon: Midge Minium, MD;  Location: [facility];  Service: Endoscopy;  Laterality: N/A;   ESOPHAGOGASTRODUODENOSCOPY (EGD) WITH PROPOFOL N/A 05/24/2019   Procedure: ESOPHAGOGASTRODUODENOSCOPY (EGD) WITH PROPOFOL;  Surgeon: Midge Minium, MD;  Location: [facility];  Service: Endoscopy;  Laterality: N/A;   ESOPHAGOGASTRODUODENOSCOPY (EGD) WITH PROPOFOL N/A 09/06/2022   Procedure: ESOPHAGOGASTRODUODENOSCOPY (EGD) WITH PROPOFOL;  Surgeon: Midge Minium, MD;  Location: [facility];  Service: Endoscopy;  Laterality: N/A;   ESOPHAGUS SURGERY     KNEE SURGERY Right    LIPOSUCTION WITH LIPOFILLING Right 06/10/2019   Procedure: right breast scar and capsule release with fat grafting;  Surgeon: Peggye Form, DO;  Location: [facility];  Service: Plastics;  Laterality: Right;   PORT-A-CATH REMOVAL     PORTACATH PLACEMENT Right 01/17/2018   Procedure: INSERTION PORT-A-CATH- RIGHT;  Surgeon: Earline Mayotte, MD;  Location: [facility];  Service: General;  Laterality: Right;   ROBOTIC ADRENALECTOMY Right  12/28/2016   Procedure: ROBOTIC ADRENALECTOMY;  Surgeon: Vanna Scotland, MD;  Location: [facility];  Service: Urology;  Laterality: Right;   SKIN CANCER EXCISION     THROAT SURGERY  1998   throat cancer    TUBAL LIGATION     VENTRAL HERNIA REPAIR N/A 03/03/2017   Procedure: HERNIA REPAIR VENTRAL ADULT;  Surgeon: Ricarda Frame, MD;  Location: [facility];  Service: General;  Laterality: N/A;    Home Medications:  Allergies as of 09/14/2022       Reactions   Sulfa Antibiotics Other (See Comments)   Mouth sore and raw        Medication List        Accurate as of Sep 14, 2022  9:43 AM. If you have any questions, ask your nurse or doctor.          STOP taking these medications    azithromycin 500 MG tablet Commonly known as: ZITHROMAX Stopped by: Michiel Cowboy, PA-C       TAKE these medications    albuterol 108 (90 Base) MCG/ACT inhaler Commonly known as: VENTOLIN  HFA Inhale 2 puffs into the lungs every 6 (six) hours as needed for shortness of breath.   albuterol (2.5 MG/3ML) 0.083% nebulizer solution Commonly known as: PROVENTIL Take 3 mLs (2.5 mg total) by nebulization every 6 (six) hours as needed for wheezing or shortness of breath.   alum & mag hydroxide-simeth 400-400-40 MG/5ML suspension Commonly known as: MAALOX PLUS Take 15 mLs by mouth every 6 (six) hours as needed for indigestion.   aspirin EC 81 MG tablet Take 1 tablet (81 mg total) by mouth daily. Swallow whole.   benzonatate 100 MG capsule Commonly known as: TESSALON TAKE 2 CAPSULES BY MOUTH 3 TIMES DAILY   Breztri Aerosphere 160-9-4.8 MCG/ACT Aero Generic drug: Budeson-Glycopyrrol-Formoterol Inhale 2 puffs into the lungs in the morning and at bedtime.   brompheniramine-pseudoephedrine-DM 30-2-10 MG/5ML syrup Take 5 mLs by mouth 4 (four) times daily as needed.   buPROPion 75 MG tablet Commonly known as: WELLBUTRIN Take 1 tablet (75 mg total) by mouth 2 (two) times daily. Take 75 mg by  mouth 2 (two) times daily.   busPIRone 5 MG tablet Commonly known as: BUSPAR Take 1 tablet (5 mg total) by mouth 2 (two) times daily.   cyclobenzaprine 5 MG tablet Commonly known as: FLEXERIL TAKE 1 TABLET BY MOUTH AT BEDTIME   DULoxetine 60 MG capsule Commonly known as: CYMBALTA TAKE 1 CAPSULE BY MOUTH DAILY   fluconazole 100 MG tablet Commonly known as: DIFLUCAN Take 1 tablet (100 mg total) by mouth daily.   fluticasone 50 MCG/ACT nasal spray Commonly known as: FLONASE Place 1 spray into both nostrils at bedtime.   gabapentin 300 MG capsule Commonly known as: NEURONTIN TAKE 1 CAPSULE BY MOUTH 3 TIMES DAILY   meloxicam 7.5 MG tablet Commonly known as: MOBIC TAKE 1 TABLET BY MOUTH DAILY   mirabegron ER 25 MG Tb24 tablet Commonly known as: MYRBETRIQ Take 1 tablet (25 mg total) by mouth daily. Started by: Michiel Cowboy, PA-C   montelukast 10 MG tablet Commonly known as: SINGULAIR Take 1 tablet (10 mg total) by mouth at bedtime.   nitrofurantoin (macrocrystal-monohydrate) 100 MG capsule Commonly known as: Macrobid Take 1 capsule (100 mg total) by mouth 2 (two) times daily.   omeprazole 40 MG capsule Commonly known as: PRILOSEC TAKE 1 CAPSULE BY MOUTH DAILY   rOPINIRole 0.25 MG tablet Commonly known as: REQUIP TAKE 1 TABLET BY MOUTH AT BEDTIME   rosuvastatin 40 MG tablet Commonly known as: CRESTOR Take 1 tablet (40 mg total) by mouth daily.   sucralfate 1 g tablet Commonly known as: CARAFATE Take 1 tablet (1 g total) by mouth 4 (four) times daily -  with meals and at bedtime.        Allergies:  Allergies  Allergen Reactions   Sulfa Antibiotics Other (See Comments)    Mouth sore and raw    Family History: Family History  Problem Relation Age of Onset   Diabetes Mother    Heart attack Mother    Cancer Sister        cancer of eyes, spread to lung, other places. died at 29   Cancer Sister 16       unknown primary, spread to bone, brain died in  12's   Cancer Paternal Aunt        unk type   Cancer Paternal Grandmother        type unk   Prostate cancer Neg Hx    Kidney cancer Neg Hx    Bladder Cancer  Neg Hx    Breast cancer Neg Hx     Social History:  reports that she quit smoking about 27 years ago. Her smoking use included cigarettes. She has a 45.00 pack-year smoking history. She has never used smokeless tobacco. She reports that she does not currently use alcohol after a past usage of about 4.0 standard drinks of alcohol per week. She reports that she does not use drugs.  ROS: Pertinent ROS in HPI  Physical Exam: BP 124/84   Pulse (!) 101   Ht 5\' 1"  (1.549 m)   Wt 140 lb (63.5 kg)   BMI 26.45 kg/m   Constitutional:  Well nourished. Alert and oriented, No acute distress. HEENT: [place] AT, moist mucus membranes.  Trachea midline Cardiovascular: No clubbing, cyanosis, or edema. Respiratory: Normal respiratory effort, no increased work of breathing. Neurologic: Grossly intact, no focal deficits, moving all 4 extremities. Psychiatric: Normal mood and affect.    Laboratory Data: N/A     Pertinent Imaging: N/A     Assessment & Plan:    1. Urge incontinence -she had failed [place] -she had a trial of Myrbetriq samples in March, but she cannot remember if she found it effective in controlling her OAB/incontinence -There is a rumor that the Myrbetriq 25 mg has gone generic, so I will send the prescription into her pharmacy, if she feels that the medication is too expensive or her insurance still will not cover the medication, she will contact us and then we will send in Vesicare 5 mg daily  2. High risk hematuria -former smoker -work up 2024 - NED -no reports of gross heme   Return in about 6 weeks (around 10/26/2022) for PVR and OAB questionnaire.  These notes generated with voice recognition software. I apologize for typographical errors.  Cloretta Ned  [facility] [address]  [address] [place], [place] [address] [phone]   I spent 30 minutes on the day of the encounter to include pre-visit record review, face-to-face time with the patient, and post-visit ordering of tests.

## 2022-09-14 ENCOUNTER — Inpatient Hospital Stay (HOSPITAL_BASED_OUTPATIENT_CLINIC_OR_DEPARTMENT_OTHER): Payer: Medicare PPO | Admitting: Oncology

## 2022-09-14 ENCOUNTER — Encounter: Payer: Self-pay | Admitting: Oncology

## 2022-09-14 ENCOUNTER — Encounter: Payer: Self-pay | Admitting: Urology

## 2022-09-14 ENCOUNTER — Inpatient Hospital Stay: Payer: Medicare PPO

## 2022-09-14 ENCOUNTER — Inpatient Hospital Stay: Payer: Medicare PPO | Attending: Oncology

## 2022-09-14 ENCOUNTER — Ambulatory Visit: Payer: Medicare PPO | Admitting: Urology

## 2022-09-14 VITALS — BP 124/84 | HR 101 | Ht 61.0 in | Wt 140.0 lb

## 2022-09-14 VITALS — BP 127/78 | Temp 98.7°F | Resp 18 | Ht 61.0 in | Wt 143.2 lb

## 2022-09-14 DIAGNOSIS — Z171 Estrogen receptor negative status [ER-]: Secondary | ICD-10-CM

## 2022-09-14 DIAGNOSIS — D519 Vitamin B12 deficiency anemia, unspecified: Secondary | ICD-10-CM | POA: Insufficient documentation

## 2022-09-14 DIAGNOSIS — R918 Other nonspecific abnormal finding of lung field: Secondary | ICD-10-CM

## 2022-09-14 DIAGNOSIS — C7491 Malignant neoplasm of unspecified part of right adrenal gland: Secondary | ICD-10-CM | POA: Diagnosis not present

## 2022-09-14 DIAGNOSIS — R319 Hematuria, unspecified: Secondary | ICD-10-CM

## 2022-09-14 DIAGNOSIS — C50511 Malignant neoplasm of lower-outer quadrant of right female breast: Secondary | ICD-10-CM

## 2022-09-14 DIAGNOSIS — Z8521 Personal history of malignant neoplasm of larynx: Secondary | ICD-10-CM | POA: Insufficient documentation

## 2022-09-14 DIAGNOSIS — C320 Malignant neoplasm of glottis: Secondary | ICD-10-CM | POA: Diagnosis not present

## 2022-09-14 DIAGNOSIS — E538 Deficiency of other specified B group vitamins: Secondary | ICD-10-CM

## 2022-09-14 DIAGNOSIS — N3941 Urge incontinence: Secondary | ICD-10-CM | POA: Diagnosis not present

## 2022-09-14 LAB — CBC WITH DIFFERENTIAL/PLATELET
Abs Immature Granulocytes: 0 10*3/uL (ref 0.00–0.07)
Basophils Absolute: 0 10*3/uL (ref 0.0–0.1)
Basophils Relative: 1 %
Eosinophils Absolute: 0.2 10*3/uL (ref 0.0–0.5)
Eosinophils Relative: 5 %
HCT: 41.9 % (ref 36.0–46.0)
Hemoglobin: 13.4 g/dL (ref 12.0–15.0)
Immature Granulocytes: 0 %
Lymphocytes Relative: 26 %
Lymphs Abs: 1.1 10*3/uL (ref 0.7–4.0)
MCH: 31.8 pg (ref 26.0–34.0)
MCHC: 32 g/dL (ref 30.0–36.0)
MCV: 99.3 fL (ref 80.0–100.0)
Monocytes Absolute: 0.4 10*3/uL (ref 0.1–1.0)
Monocytes Relative: 10 %
Neutro Abs: 2.4 10*3/uL (ref 1.7–7.7)
Neutrophils Relative %: 58 %
Platelets: 166 10*3/uL (ref 150–400)
RBC: 4.22 MIL/uL (ref 3.87–5.11)
RDW: 12.2 % (ref 11.5–15.5)
WBC: 4.2 10*3/uL (ref 4.0–10.5)
nRBC: 0 % (ref 0.0–0.2)

## 2022-09-14 LAB — VITAMIN B12: Vitamin B-12: 312 pg/mL (ref 180–914)

## 2022-09-14 LAB — FERRITIN: Ferritin: 217 ng/mL (ref 11–307)

## 2022-09-14 LAB — IRON AND TIBC
Iron: 93 ug/dL (ref 28–170)
Saturation Ratios: 30 % (ref 10.4–31.8)
TIBC: 309 ug/dL (ref 250–450)
UIBC: 216 ug/dL

## 2022-09-14 MED ORDER — CYANOCOBALAMIN 1000 MCG/ML IJ SOLN
1000.0000 ug | Freq: Once | INTRAMUSCULAR | Status: AC
  Administered 2022-09-14: 1000 ug via INTRAMUSCULAR
  Filled 2022-09-14: qty 1

## 2022-09-14 MED ORDER — MIRABEGRON ER 25 MG PO TB24
25.0000 mg | ORAL_TABLET | Freq: Every day | ORAL | 3 refills | Status: DC
Start: 2022-09-14 — End: 2022-11-21

## 2022-09-14 NOTE — Progress Notes (Signed)
Hematology/Oncology Consult note [facility]  Telephone:([phone] [phone]  Patient Care Team: Jacky Kindle, FNP as PCP - General (Family Medicine) Debbe Odea, MD as PCP - Cardiology (Cardiology) Creig Hines, MD as Consulting Physician (Oncology) Leafy Ro, MD as Consulting Physician (General Surgery) Dasher, Cliffton Asters, MD (Dermatology) Scheeler, Kermit Balo, PA-C as Physician Assistant (Plastic Surgery) Minna Antis, MD as Attending Physician (Emergency Medicine) Alvira Philips Leitha Bleak (Neurology) Dayna Barker, MD as Referring Physician (Internal Medicine) Coralyn Helling, MD as Consulting Physician (Pulmonary Disease) Gaspar Cola, [facility] (Pharmacist)   Name of the patient: Denise Watts  [id number]  07-30-1955   Date of visit: 09/14/22  Diagnosis- 1. H/o breast cancer in 1997 2. H/o stage I SCC of vocal cord in 2015. 3. Lung nodules 4. Adrenal carcinoma of unknown primary s/p resection 5. Stage IaT1a NxcM0 ER weakly positive 1-10%, PR negative and her 2 negative   Chief complaint/ Reason for visit-routine follow-up of breast cancer and B12 deficiency anemia  Heme/Onc history:  Oncology History Overview Note  1. Carcinoma of breast, T1, N0, M0 tumor diagnosis.  In January of 1997 2. Carcinoma of the vocal cord T1, N0, M0 tumor.  Status postradiation therapy 3. Abnormal CT scan of the chest (December, 2015) 4. Repeat CT scan of chest shows stable nodule (July, 2016) 5. Neutropenia with normal hemoglobin and normal platelet count (July, 2016)   Cancer of vocal cord [facility]) (Resolved)  11/10/2014 Initial Diagnosis   Cancer of vocal cord   Malignant neoplasm of lower-outer quadrant of right breast of female, estrogen receptor negative (HCC)  11/24/2017 Initial Diagnosis   Malignant neoplasm of lower-outer quadrant of right breast of female, estrogen receptor negative (HCC)   01/12/2018 Cancer Staging   Staging  form: Breast, AJCC 8th Edition - Clinical stage from 01/12/2018: Stage IB (cT1a, cN0, cM0, G3, ER+, PR-, HER2-) - Signed by Creig Hines, MD on 01/12/2018   01/30/2018 - 04/26/2018 Chemotherapy   The patient had dexamethasone (DECADRON) 4 MG tablet, 8 mg, Oral, 2 times daily, 1 of 1 cycle, Start date: 01/12/2018, End date: 08/03/2018 palonosetron (ALOXI) injection 0.25 mg, 0.25 mg, Intravenous,  Once, 4 of 4 cycles Administration: 0.25 mg (01/30/2018), 0.25 mg (02/27/2018), 0.25 mg (03/28/2018), 0.25 mg (04/26/2018) pegfilgrastim (NEULASTA ONPRO KIT) injection 6 mg, 6 mg, Subcutaneous, Once, 4 of 4 cycles Administration: 6 mg (01/30/2018), 6 mg (02/27/2018), 6 mg (03/28/2018), 6 mg (04/26/2018) cyclophosphamide (CYTOXAN) 1,000 mg in sodium chloride 0.9 % 250 mL chemo infusion, 600 mg/m2 = 1,000 mg, Intravenous,  Once, 4 of 4 cycles Administration: 1,000 mg (01/30/2018), 1,000 mg (02/27/2018), 1,000 mg (03/28/2018), 1,000 mg (04/26/2018) DOCEtaxel (TAXOTERE) 130 mg in sodium chloride 0.9 % 250 mL chemo infusion, 75 mg/m2 = 130 mg, Intravenous,  Once, 4 of 4 cycles Dose modification: 60 mg/m2 (original dose 75 mg/m2, Cycle 2, Reason: Dose not tolerated) Administration: 130 mg (01/30/2018), 100 mg (02/27/2018), 100 mg (03/28/2018), 100 mg (04/26/2018)  for chemotherapy treatment.     The patient was originally diagnosed with right breast cancer in 1997, 10 mm grade 3, ER pr positive her 2 negative, node negative status post surgery and radiation treatment only. She never received chemotherapy or hormone therapy. For her vocal cord cancer, she had extensive surgery and radiation therapy in 1998. Due to past history of smoking, she was also noted to have lung nodules. She is undergoing active surveillance imaging study of the chest.   Patient  noted to have adrenal mass on CT chest which prompted PET CT in July 2018 which showed: IMPRESSION: 1. Right adrenal mass with peripheral hypermetabolism.  Given interval development since 11/18/2015, suspicious for either isolated metastasis or a primary adrenal neoplasm. This should be considered for biopsy. 2. No evidence of hypermetabolic primary malignancy or extraadrenal metastasis. Pulmonary nodules are not significantly hypermetabolic. 3. Coronary artery atherosclerosis. Aortic Atherosclerosis (ICD10-I70.0). 4. Hepatic steatosis. 5. Left sixth rib hypermetabolism is favored to be related to remote Trauma.   Patient underwent right adrenalectomy which showed: DIAGNOSIS:  A. ADRENAL GLAND, RIGHT; ADRENALECTOMY:  - HIGH GRADE CARCINOMA WITH EXTENSIVE NECROSIS.  - MARGINS OF THE SPECIMEN ARE NEGATIVE FOR CARCINOMA.   Comment:  Sections demonstrate abundant necrosis confined to the adrenal medullary  compartment. In one block of tissue a small fragment of viable malignant  neoplasm was identified. A panel of immunohistochemical stains was  performed with the following pattern of immunoreactivity:  Super pancytokeratin: Positive  GATA-3: Negative  TTF-1: Negative  P40: Negative  Napsin: Negative  PAX-8: Negative  Chromogranin: Negative     Repeat CT chest abdomen and pelvis in July 2019 showed stable left lower lobe pulmonary nodule.  No other evidence of metastatic disease.  New 4.5 mm nodule in the right breast.  This was followed by ultrasound mammogram and core biopsy of the breast lesion which was a grade 3 triple negative breast cancer.  Patient was seen by Dr. Lemar Livings and underwent lumpectomy on 12/08/2017.   Patient had a 5 mm weakly ER positive tumor.  Given her ongoing fatigue Oncotype testing was done to see if she could potentially avoid chemotherapy.  Oncotype shows a recurrence score of 53 with her risk of distant recurrence at 9 years at greater than 39% with hormone therapy alone.  Absolute benefit of chemotherapy was greater than 15% in her age group.  Patient completed 4 cycles of adjuvant TC chemotherapy in December  2019    Interval history-patient has chronic fatigue.  She has generalized aches and pains.  She remains independent of her ADLs and IADLs.  ECOG PS- 1 Pain scale- 0   Review of systems- Review of Systems  Constitutional:  Positive for malaise/fatigue. Negative for chills, fever and weight loss.  HENT:  Negative for congestion, ear discharge and nosebleeds.   Eyes:  Negative for blurred vision.  Respiratory:  Negative for cough, hemoptysis, sputum production, shortness of breath and wheezing.   Cardiovascular:  Negative for chest pain, palpitations, orthopnea and claudication.  Gastrointestinal:  Negative for abdominal pain, blood in stool, constipation, diarrhea, heartburn, melena, nausea and vomiting.  Genitourinary:  Negative for dysuria, flank pain, frequency, hematuria and urgency.  Musculoskeletal:  Negative for back pain, joint pain and myalgias.  Skin:  Negative for rash.  Neurological:  Negative for dizziness, tingling, focal weakness, seizures, weakness and headaches.  Endo/Heme/Allergies:  Does not bruise/bleed easily.  Psychiatric/Behavioral:  Negative for depression and suicidal ideas. The patient does not have insomnia.       Allergies  Allergen Reactions   Sulfa Antibiotics Other (See Comments)    Mouth sore and raw     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   Cough 03/02/2017   INTERMTTENT DRY  COUGH   Depression    GERD (gastroesophageal reflux disease)    History of hiatal hernia    Personal history of chemotherapy    Personal history of radiation therapy    Skin cancer    Throat cancer (HCC) 1998   radiation   Wears dentures    full upper and lower     Past Surgical History:  Procedure Laterality Date   BREAST BIOPSY  Right 1997   positive   BREAST BIOPSY Right 11/15/2017   INVASIVE MAMMARY CARCINOMA triple negative   BREAST BIOPSY Right 07/20/2020   [place] bx, Q marker, neg   BREAST LUMPECTOMY Right 1997   2019 also   BREAST LUMPECTOMY WITH SENTINEL LYMPH NODE BIOPSY Right 12/08/2017   Procedure: BREAST LUMPECTOMY WITH SENTINEL LYMPH NODE BX;  Surgeon: Earline Mayotte, MD;  Location: [facility];  Service: General;  Laterality: Right;   BREAST REDUCTION WITH MASTOPEXY Left 06/10/2019   Procedure: Left breast mastopexy reduction for symmetry;  Surgeon: Peggye Form, DO;  Location: [facility];  Service: Plastics;  Laterality: Left;  total 2.5 hours   CATARACT EXTRACTION W/PHACO Right 02/07/2022   Procedure: CATARACT EXTRACTION PHACO AND INTRAOCULAR LENS PLACEMENT (IOC) RIGHT DIABETIC;  Surgeon: Nevada Crane, MD;  Location: [facility];  Service: Ophthalmology;  Laterality: Right;  3.60 00:34.7   CATARACT EXTRACTION W/PHACO Left 02/28/2022   Procedure: CATARACT EXTRACTION PHACO AND INTRAOCULAR LENS PLACEMENT (IOC) LEFT DIABETIC 4.11 00:26.1;  Surgeon: Nevada Crane, MD;  Location: [facility];  Service: Ophthalmology;  Laterality: Left;   COLONOSCOPY  2015   COLONOSCOPY WITH PROPOFOL N/A 10/29/2019   Procedure: COLONOSCOPY WITH PROPOFOL;  Surgeon: Midge Minium, MD;  Location: [facility];  Service: Endoscopy;  Laterality: N/A;   ESOPHAGOGASTRODUODENOSCOPY (EGD) WITH PROPOFOL N/A 05/24/2019   Procedure: ESOPHAGOGASTRODUODENOSCOPY (EGD) WITH PROPOFOL;  Surgeon: Midge Minium, MD;  Location: [facility];  Service: Endoscopy;  Laterality: N/A;   ESOPHAGOGASTRODUODENOSCOPY (EGD) WITH PROPOFOL N/A 09/06/2022   Procedure: ESOPHAGOGASTRODUODENOSCOPY (EGD) WITH PROPOFOL;  Surgeon: Midge Minium, MD;  Location: [facility];  Service: Endoscopy;  Laterality: N/A;   ESOPHAGUS SURGERY     KNEE SURGERY Right    LIPOSUCTION WITH LIPOFILLING Right 06/10/2019   Procedure:  right breast scar and capsule release with fat grafting;  Surgeon: Peggye Form, DO;  Location: [facility];  Service: Plastics;  Laterality: Right;   PORT-A-CATH REMOVAL     PORTACATH PLACEMENT Right 01/17/2018   Procedure: INSERTION PORT-A-CATH- RIGHT;  Surgeon: Earline Mayotte, MD;  Location: [facility];  Service: General;  Laterality: Right;   ROBOTIC ADRENALECTOMY Right 12/28/2016   Procedure: ROBOTIC ADRENALECTOMY;  Surgeon: Vanna Scotland, MD;  Location: [facility];  Service: Urology;  Laterality: Right;   SKIN CANCER EXCISION     THROAT SURGERY  1998   throat cancer    TUBAL LIGATION     VENTRAL HERNIA REPAIR N/A 03/03/2017   Procedure: HERNIA REPAIR VENTRAL ADULT;  Surgeon: Ricarda Frame, MD;  Location: [facility];  Service: General;  Laterality: N/A;    Social History   Socioeconomic History   Marital status: Married    Spouse name: Not on file   Number of children: 4   Years of education: Not on file   Highest education level: 10th grade  Occupational History   Occupation: retired  Tobacco Use   Smoking status: Former    Packs/day: 1.50    Years: 30.00    Additional pack  years: 0.00    Total pack years: 45.00    Types: Cigarettes    Quit date: 05/03/1995    Years since quitting: 27.3   Smokeless tobacco: Never  Vaping Use   Vaping Use: Never used  Substance and Sexual Activity   Alcohol use: Not Currently    Alcohol/week: 4.0 standard drinks of alcohol    Types: 4 Shots of liquor per week    Comment: none   Drug use: No   Sexual activity: Not Currently    Birth control/protection: Post-menopausal  Other Topics Concern   Not on file  Social History Narrative   Not on file   Social Determinants of Health   Financial Resource Strain: Low Risk  (12/02/2021)   Overall Financial Resource Strain (CARDIA)    Difficulty of Paying Living Expenses: Not very hard  Food Insecurity: No Food Insecurity (12/02/2021)   Hunger Vital Sign    Worried  About Running Out of Food in the Last Year: Never true    Ran Out of Food in the Last Year: Never true  Transportation Needs: No Transportation Needs (12/02/2021)   PRAPARE - Administrator, Civil Service (Medical): No    Lack of Transportation (Non-Medical): No  Physical Activity: Insufficiently Active (12/02/2021)   Exercise Vital Sign    Days of Exercise per Week: 3 days    Minutes of Exercise per Session: 30 min  Stress: Stress Concern Present (12/02/2021)   [employer] of Occupational Health - Occupational Stress Questionnaire    Feeling of Stress : To some extent  Social Connections: Moderately Isolated (12/02/2021)   Social Connection and Isolation Panel [NHANES]    Frequency of Communication with Friends and Family: Three times a week    Frequency of Social Gatherings with Friends and Family: Twice a week    Attends Religious Services: Never    Database administrator or Organizations: No    Attends Banker Meetings: Never    Marital Status: Married  Catering manager Violence: Not At Risk (12/02/2021)   Humiliation, Afraid, Rape, and Kick questionnaire    Fear of Current or Ex-Partner: No    Emotionally Abused: No    Physically Abused: No    Sexually Abused: No    Family History  Problem Relation Age of Onset   Diabetes Mother    Heart attack Mother    Cancer Sister        cancer of eyes, spread to lung, other places. died at 33   Cancer Sister 90       unknown primary, spread to bone, brain died in 53's   Cancer Paternal Aunt        unk type   Cancer Paternal Grandmother        type unk   Prostate cancer Neg Hx    Kidney cancer Neg Hx    Bladder Cancer Neg Hx    Breast cancer Neg Hx      Current Outpatient Medications:    albuterol (PROVENTIL) (2.5 MG/3ML) 0.083% nebulizer solution, Take 3 mLs (2.5 mg total) by nebulization every 6 (six) hours as needed for wheezing or shortness of breath., Disp: 150 mL, Rfl: 1   albuterol (VENTOLIN HFA)  108 (90 Base) MCG/ACT inhaler, Inhale 2 puffs into the lungs every 6 (six) hours as needed for shortness of breath., Disp: 6.7 g, Rfl: 0   alum & mag hydroxide-simeth (MAALOX PLUS) 400-400-40 MG/5ML suspension, Take 15 mLs by mouth every 6 (  six) hours as needed for indigestion., Disp: 355 mL, Rfl: 0   aspirin EC 81 MG tablet, Take 1 tablet (81 mg total) by mouth daily. Swallow whole., Disp: 90 tablet, Rfl: 3   benzonatate (TESSALON) 100 MG capsule, TAKE 2 CAPSULES BY MOUTH 3 TIMES DAILY, Disp: 180 capsule, Rfl: 0   brompheniramine-pseudoephedrine-DM 30-2-10 MG/5ML syrup, Take 5 mLs by mouth 4 (four) times daily as needed., Disp: 120 mL, Rfl: 0   Budeson-Glycopyrrol-Formoterol (BREZTRI AEROSPHERE) 160-9-4.8 MCG/ACT AERO, Inhale 2 puffs into the lungs in the morning and at bedtime., Disp: 5.9 g, Rfl: 5   buPROPion (WELLBUTRIN) 75 MG tablet, Take 1 tablet (75 mg total) by mouth 2 (two) times daily. Take 75 mg by mouth 2 (two) times daily., Disp: 180 tablet, Rfl: 1   busPIRone (BUSPAR) 5 MG tablet, Take 1 tablet (5 mg total) by mouth 2 (two) times daily., Disp: 180 tablet, Rfl: 3   cyclobenzaprine (FLEXERIL) 5 MG tablet, TAKE 1 TABLET BY MOUTH AT BEDTIME, Disp: 30 tablet, Rfl: 0   DULoxetine (CYMBALTA) 60 MG capsule, TAKE 1 CAPSULE BY MOUTH DAILY, Disp: 90 capsule, Rfl: 3   fluconazole (DIFLUCAN) 100 MG tablet, Take 1 tablet (100 mg total) by mouth daily., Disp: 10 tablet, Rfl: 0   fluticasone (FLONASE) 50 MCG/ACT nasal spray, Place 1 spray into both nostrils at bedtime., Disp: 16 g, Rfl: 6   gabapentin (NEURONTIN) 300 MG capsule, TAKE 1 CAPSULE BY MOUTH 3 TIMES DAILY, Disp: 60 capsule, Rfl: 5   meloxicam (MOBIC) 7.5 MG tablet, TAKE 1 TABLET BY MOUTH DAILY, Disp: 30 tablet, Rfl: 0   mirabegron ER (MYRBETRIQ) 25 MG TB24 tablet, Take 1 tablet (25 mg total) by mouth daily., Disp: 90 tablet, Rfl: 3   montelukast (SINGULAIR) 10 MG tablet, Take 1 tablet (10 mg total) by mouth at bedtime., Disp: 90 tablet, Rfl:  3   nitrofurantoin, macrocrystal-monohydrate, (MACROBID) 100 MG capsule, Take 1 capsule (100 mg total) by mouth 2 (two) times daily., Disp: 14 capsule, Rfl: 0   omeprazole (PRILOSEC) 40 MG capsule, TAKE 1 CAPSULE BY MOUTH DAILY, Disp: 90 capsule, Rfl: 0   rOPINIRole (REQUIP) 0.25 MG tablet, TAKE 1 TABLET BY MOUTH AT BEDTIME, Disp: 90 tablet, Rfl: 1   rosuvastatin (CRESTOR) 40 MG tablet, Take 1 tablet (40 mg total) by mouth daily., Disp: 90 tablet, Rfl: 3   sucralfate (CARAFATE) 1 g tablet, Take 1 tablet (1 g total) by mouth 4 (four) times daily -  with meals and at bedtime., Disp: 120 tablet, Rfl: 0 No current facility-administered medications for this visit.  Facility-Administered Medications Ordered in Other Visits:    0.9 %  sodium chloride infusion, , Intravenous, Once PRN, Creig Hines, MD   albuterol (PROVENTIL) (2.5 MG/3ML) 0.083% nebulizer solution 2.5 mg, 2.5 mg, Nebulization, Once PRN, Creig Hines, MD   cyanocobalamin ((VITAMIN B-12)) injection 1,000 mcg, 1,000 mcg, Intramuscular, Q30 days, Creig Hines, MD, 1,000 mcg at 08/27/21 1537   cyanocobalamin (VITAMIN B12) injection 1,000 mcg, 1,000 mcg, Intramuscular, Q30 days, Creig Hines, MD, 1,000 mcg at 03/16/22 1101  Physical exam:  Vitals:   09/14/22 1124  BP: 127/78  Resp: 18  Temp: 98.7 F (37.1 C)  TempSrc: Tympanic  SpO2: 100%  Weight: 143 lb 3.2 oz (65 kg)  Height: 5\' 1"  (1.549 m)   Physical Exam Cardiovascular:     Rate and Rhythm: Normal rate and regular rhythm.     Heart sounds: Normal heart sounds.  Pulmonary:  Effort: Pulmonary effort is normal.     Breath sounds: Normal breath sounds.  Abdominal:     General: Bowel sounds are normal.     Palpations: Abdomen is soft.  Skin:    General: Skin is warm and dry.  Neurological:     Mental Status: She is alert and oriented to person, place, and time.    Breast exam was performed in seated and lying down position. Patient is status post right  lumpectomy with a well-healed surgical scar. No evidence of any palpable masses. No evidence of axillary adenopathy. No evidence of any palpable masses or lumps in the left breast. No evidence of leftt axillary adenopathy      Latest Ref Rng & Units 03/16/2022    9:42 AM  CMP  Glucose 70 - 99 mg/dL 95   BUN 8 - 23 mg/dL 17   Creatinine 8.11 - 1.00 mg/dL 9.14   Sodium 782 - 956 mmol/L 137   Potassium 3.5 - 5.1 mmol/L 4.8   Chloride 98 - 111 mmol/L 100   CO2 22 - 32 mmol/L 29   Calcium 8.9 - 10.3 mg/dL 9.1   Total Protein 6.5 - 8.1 g/dL 6.6   Total Bilirubin 0.3 - 1.2 mg/dL 0.6   Alkaline Phos 38 - 126 U/L 59   AST 15 - 41 U/L 22   ALT 0 - 44 U/L 17       Latest Ref Rng & Units 09/14/2022   11:13 AM  CBC  WBC 4.0 - 10.5 K/uL 4.2   Hemoglobin 12.0 - 15.0 g/dL 21.3   Hematocrit 08.6 - 46.0 % 41.9   Platelets 150 - 400 K/uL 166      Assessment and plan- Patient is a 67 y.o. female who is here for follow-up of following issues:  History of right breast cancer: She is now 5 years out of her diagnosis of right breast cancer in 2019.  Clinically she is doing well with no concerning signs and symptoms of recurrence based on today's exam.  She will be due for a mammogram in November 2024 which I will schedule.  History of B12 deficiency: Continue B12 injections  History of lung nodules: Patient had aCT chest in January 2023 which showed mostly stable lung nodules but son nodules were new and subcentimeter.  I will plan to repeat a CT chest without contrast in November as well.  History of adrenal tumor s/p resection: Patient had an MRI in October 2023 which did not show any evidence of recurrence.  She follows up with urology for her complex renal cyst.  I will see her back in 6 months after CT chest and mammogram   Visit Diagnosis 1. Lung nodules   2. Malignant neoplasm of lower-outer quadrant of right breast of female, estrogen receptor negative (HCC)   3. Adrenal cancer, right  (HCC)   4. Vocal cord cancer [facility])      Dr. Owens Shark, MD, MPH [facility] [phone] 09/14/2022 1:16 PM

## 2022-09-15 ENCOUNTER — Other Ambulatory Visit: Payer: Self-pay | Admitting: Family Medicine

## 2022-09-15 DIAGNOSIS — G894 Chronic pain syndrome: Secondary | ICD-10-CM

## 2022-09-15 NOTE — Telephone Encounter (Signed)
Is it ok to refill:   Cyclobenzaprine Last refill: 07/20/2022, #30 with 0 refills Next OV: 12/05/2022 (AWV with [facility])  Meloxicam Last refill: 07/29/2022 #30 with 0 refills Next OV: 12/05/2022 (AWV with [facility])  Last office visit: 07/14/2022 (acute visit)

## 2022-09-19 ENCOUNTER — Other Ambulatory Visit: Payer: Self-pay | Admitting: Gastroenterology

## 2022-09-21 ENCOUNTER — Ambulatory Visit: Payer: Medicare PPO | Admitting: Urology

## 2022-09-23 DIAGNOSIS — U071 COVID-19: Secondary | ICD-10-CM | POA: Diagnosis not present

## 2022-09-29 ENCOUNTER — Other Ambulatory Visit: Payer: Self-pay | Admitting: Gastroenterology

## 2022-09-29 ENCOUNTER — Other Ambulatory Visit: Payer: Self-pay | Admitting: Family Medicine

## 2022-10-03 DIAGNOSIS — J449 Chronic obstructive pulmonary disease, unspecified: Secondary | ICD-10-CM | POA: Diagnosis not present

## 2022-10-03 DIAGNOSIS — U071 COVID-19: Secondary | ICD-10-CM | POA: Diagnosis not present

## 2022-10-10 DIAGNOSIS — D0461 Carcinoma in situ of skin of right upper limb, including shoulder: Secondary | ICD-10-CM | POA: Diagnosis not present

## 2022-10-10 DIAGNOSIS — D485 Neoplasm of uncertain behavior of skin: Secondary | ICD-10-CM | POA: Diagnosis not present

## 2022-10-10 DIAGNOSIS — L570 Actinic keratosis: Secondary | ICD-10-CM | POA: Diagnosis not present

## 2022-10-10 DIAGNOSIS — D0471 Carcinoma in situ of skin of right lower limb, including hip: Secondary | ICD-10-CM | POA: Diagnosis not present

## 2022-10-11 ENCOUNTER — Other Ambulatory Visit: Payer: Self-pay | Admitting: Family Medicine

## 2022-10-12 ENCOUNTER — Ambulatory Visit (INDEPENDENT_AMBULATORY_CARE_PROVIDER_SITE_OTHER): Payer: Medicare PPO | Admitting: Family Medicine

## 2022-10-12 ENCOUNTER — Encounter: Payer: Self-pay | Admitting: Family Medicine

## 2022-10-12 ENCOUNTER — Telehealth: Payer: Self-pay | Admitting: *Deleted

## 2022-10-12 VITALS — BP 119/77 | HR 93 | Temp 98.2°F | Resp 16 | Wt 135.9 lb

## 2022-10-12 DIAGNOSIS — F411 Generalized anxiety disorder: Secondary | ICD-10-CM

## 2022-10-12 DIAGNOSIS — R457 State of emotional shock and stress, unspecified: Secondary | ICD-10-CM

## 2022-10-12 DIAGNOSIS — F322 Major depressive disorder, single episode, severe without psychotic features: Secondary | ICD-10-CM | POA: Diagnosis not present

## 2022-10-12 DIAGNOSIS — Z9189 Other specified personal risk factors, not elsewhere classified: Secondary | ICD-10-CM | POA: Diagnosis not present

## 2022-10-12 MED ORDER — DULOXETINE HCL 60 MG PO CPEP: 60.0000 mg | ORAL_CAPSULE | Freq: Two times a day (BID) | ORAL | 0 refills | Status: DC

## 2022-10-12 MED ORDER — BUSPIRONE HCL 5 MG PO TABS
5.0000 mg | ORAL_TABLET | Freq: Three times a day (TID) | ORAL | 0 refills | Status: DC
Start: 2022-10-12 — End: 2023-05-07

## 2022-10-12 MED ORDER — BUPROPION HCL ER (SR) 100 MG PO TB12
100.0000 mg | ORAL_TABLET | Freq: Two times a day (BID) | ORAL | 0 refills | Status: DC
Start: 2022-10-12 — End: 2023-01-16
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
## 2022-10-12 NOTE — Assessment & Plan Note (Signed)
Severe, worsening Will increase wellbutrin and cymbalta to assist Encourage f/u in 8 weeks
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
## 2022-10-12 NOTE — Progress Notes (Signed)
  Care Coordination  Outreach Note  10/12/2022 Name: Denise Watts MRN: [id number] DOB: 06-08-1955   Care Coordination Outreach Attempts: An unsuccessful telephone outreach was attempted today to offer the patient information about available care coordination services.  Follow Up Plan:  Additional outreach attempts will be made to offer the patient care coordination information and services.   Encounter Outcome:  No Answer  Burman Nieves, CCMA Care Coordination Care Guide Direct Dial: [phone]

## 2022-10-12 NOTE — Patient Instructions (Signed)
Medications were revised today to assist with ongoing stressors at home; referral made to assist. Encourage conversation with neighbors, friends, family etc regarding safety concerns.

## 2022-10-13 NOTE — Progress Notes (Signed)
  Care Coordination   Note   10/13/2022 Name: Denise Watts MRN: [id number] DOB: 12/21/1955  Denise Watts is a 67 y.o. year old female who sees Jacky Kindle, FNP for primary care. I reached out to Denise Watts by phone today to offer care coordination services.  [name] was given information about Care Coordination services today including:   The Care Coordination services include support from the care team which includes your Nurse Coordinator, Clinical Social Worker, or Pharmacist.  The Care Coordination team is here to help remove barriers to the health concerns and goals most important to you. Care Coordination services are voluntary, and the patient may decline or stop services at any time by request to their care team member.   Care Coordination Consent Status: Patient agreed to services and verbal consent obtained.   Follow up plan:  Telephone appointment with care coordination team member scheduled for:  10/18/2022  Encounter Outcome:  Pt. Scheduled from referral   Burman Nieves, [facility] Care Coordination Care Guide Direct Dial: [phone]

## 2022-10-17 ENCOUNTER — Inpatient Hospital Stay: Payer: Medicare PPO

## 2022-10-18 ENCOUNTER — Encounter: Payer: Self-pay | Admitting: *Deleted

## 2022-10-18 ENCOUNTER — Telehealth: Payer: Self-pay | Admitting: *Deleted

## 2022-10-18 ENCOUNTER — Inpatient Hospital Stay: Payer: Medicare PPO | Attending: Oncology

## 2022-10-18 DIAGNOSIS — Z23 Encounter for immunization: Secondary | ICD-10-CM

## 2022-10-18 DIAGNOSIS — E538 Deficiency of other specified B group vitamins: Secondary | ICD-10-CM

## 2022-10-18 DIAGNOSIS — D519 Vitamin B12 deficiency anemia, unspecified: Secondary | ICD-10-CM | POA: Diagnosis not present

## 2022-10-18 MED ORDER — CYANOCOBALAMIN 1000 MCG/ML IJ SOLN
1000.0000 ug | INTRAMUSCULAR | Status: DC
  Administered 2022-10-18: 1000 ug via INTRAMUSCULAR
  Filled 2022-10-18: qty 1

## 2022-10-18 NOTE — Patient Outreach (Signed)
  Care Coordination   10/18/2022 Name: Denise Watts MRN: [id number] DOB: 1955/09/11   Care Coordination Outreach Attempts:  An unsuccessful telephone outreach was attempted for a scheduled appointment today.  Follow Up Plan:  Additional outreach attempts will be made to offer the patient care coordination information and services.   Encounter Outcome:  No Answer   Care Coordination Interventions:  No, not indicated    Adalaide Jaskolski, LCSW Clinical Social Worker  [facility] Management [phone]

## 2022-10-24 DIAGNOSIS — U071 COVID-19: Secondary | ICD-10-CM | POA: Diagnosis not present

## 2022-10-25 DIAGNOSIS — L905 Scar conditions and fibrosis of skin: Secondary | ICD-10-CM | POA: Diagnosis not present

## 2022-10-25 DIAGNOSIS — C44722 Squamous cell carcinoma of skin of right lower limb, including hip: Secondary | ICD-10-CM | POA: Diagnosis not present

## 2022-10-26 ENCOUNTER — Other Ambulatory Visit: Payer: Self-pay | Admitting: Family Medicine

## 2022-10-27 NOTE — Telephone Encounter (Signed)
Requested medication (s) are due for refill today: Yes  Requested medication (s) are on the active medication list: Yes  Last refill:  09/15/22  Future visit scheduled: No  Notes to clinic:  Manual review.    Requested Prescriptions  Pending Prescriptions Disp Refills   meloxicam (MOBIC) 7.5 MG tablet [Pharmacy Med Name: MELOXICAM 7.5 MG TAB] 30 tablet 0    Sig: TAKE 1 TABLET BY MOUTH DAILY     Analgesics:  COX2 Inhibitors Failed - 10/26/2022 10:00 AM      Failed - Manual Review: Labs are only required if the patient has taken medication for more than 8 weeks.      Passed - HGB in normal range and within 360 days    Hemoglobin  Date Value Ref Range Status  09/14/2022 13.4 12.0 - 15.0 g/dL Final  69/62/9528 41.3 11.1 - 15.9 g/dL Final         Passed - Cr in normal range and within 360 days    Creatinine  Date Value Ref Range Status  04/08/2014 0.67 0.60 - 1.30 mg/dL Final   Creatinine, Ser  Date Value Ref Range Status  03/16/2022 0.73 0.44 - 1.00 mg/dL Final         Passed - HCT in normal range and within 360 days    HCT  Date Value Ref Range Status  09/14/2022 41.9 36.0 - 46.0 % Final   Hematocrit  Date Value Ref Range Status  11/03/2021 42.1 34.0 - 46.6 % Final         Passed - AST in normal range and within 360 days    AST  Date Value Ref Range Status  03/16/2022 22 15 - 41 U/L Final   SGOT(AST)  Date Value Ref Range Status  11/26/2012 25 15 - 37 Unit/L Final         Passed - ALT in normal range and within 360 days    ALT  Date Value Ref Range Status  03/16/2022 17 0 - 44 U/L Final   SGPT (ALT)  Date Value Ref Range Status  11/26/2012 21 12 - 78 U/L Final         Passed - eGFR is 30 or above and within 360 days    EGFR (African American)  Date Value Ref Range Status  04/08/2014 >60 >57mL/min Final  11/26/2012 >60  Final   GFR calc Af Amer  Date Value Ref Range Status  01/20/2020 >60 >60 mL/min Final   EGFR (Non-African Amer.)  Date Value  Ref Range Status  04/08/2014 >60 >49mL/min Final    Comment:    eGFR values <69mL/min/1.73 m2 may be an indication of chronic kidney disease (CKD). Calculated eGFR, using the MRDR Study equation, is useful in  patients with stable renal function. The eGFR calculation will not be reliable in acutely ill patients when serum creatinine is changing rapidly. It is not useful in patients on dialysis. The eGFR calculation may not be applicable to patients at the low and high extremes of body sizes, pregnant women, and vegetarians.   11/26/2012 >60  Final    Comment:    eGFR values <36mL/min/1.73 m2 may be an indication of chronic kidney disease (CKD). Calculated eGFR is useful in patients with stable renal function. The eGFR calculation will not be reliable in acutely ill patients when serum creatinine is changing rapidly. It is not useful in  patients on dialysis. The eGFR calculation may not be applicable to patients at the low and  high extremes of body sizes, pregnant women, and vegetarians.    GFR, Estimated  Date Value Ref Range Status  03/16/2022 >60 >60 mL/min Final    Comment:    (NOTE) Calculated using the CKD-EPI Creatinine Equation (2021)    eGFR  Date Value Ref Range Status  11/03/2021 84 >59 mL/min/1.73 Final         Passed - Patient is not pregnant      Passed - Valid encounter within last 12 months    Recent Outpatient Visits           2 weeks ago Severe depression [facility])   [facility] Merita Norton T, FNP   3 months ago Symptoms of upper respiratory infection (URI)   [facility] [place], [place], PA-C   5 months ago Lung nodules   [facility] Merita Norton T, FNP   7 months ago Chronic pain syndrome   [facility] Jacky Kindle, FNP   9 months ago Right arm numbness   [facility] [place], [place], PA-C       Future  Appointments             In 6 days McG[place], Elana Alm [facility]

## 2022-11-01 NOTE — Progress Notes (Unsigned)
11/02/2022 11:48 AM   Denise Watts 10-Jul-1955 [id number]  Referring provider: Jacky Kindle, FNP [address] [place],  [place] [address]  Urological history: 1. OAB wet -contributing factors of age, smoking, vaginal atrophy, COPD, anxiety, depression, parkinsonism, pre-diabetic and muscle relaxer's -cysto 2024- NED -failed Gemtesa 75 mg daily   2. Stress incontinence -contributing factors of age and vaginal atrophy   3. Right adrenal mass -s/p right adrenalectomy 2018 - pathology positive for high grade carcinoma with extensive necrosis - surgical margins negative  -followed by the Cancer center  4. High risk hematuria -former smoker -contrast CT (11/2021) - indeterminate 1.5 cm left renal lesion -MRI (01/2022) - 1.5 cm Bosniak II cyst in left kidney -cysto (08/2022) - NED  Chief Complaint  Patient presents with   Over Active Bladder    HPI: Denise Watts is a 67 y.o. female who presents today for follow up after a trial of Myrbetriq.     At her visit on 07/21/2022, she had been having 2 weeks of urge incontinence with painful urination.  She was currently on a Z-Pak for sinus infection.  Patient denied any modifying or aggravating factors.  Patient denied any gross hematuria or suprapubic/flank pain.  Patient denied any fevers, chills, nausea or vomiting.  She also stated that she had been having a slimy vaginal discharge every morning for the last several weeks.  She had started to experience vaginal itching over the last week.   She was seen by gynecology back in August 2023 for similar complaints and wet prep was negative at that time.  It stated in the note that she was to follow-up with the MD in 2 weeks, but that appointment was not completed.  UA yellow slightly cloudy, specific gravity 1.025, 1+ blood, pH 5.5, 2+ protein, 1+ leukocyte, greater than 30 WBCs, 11-30 RBCs, 0-10 epithelial cells, granular casts are present, mucus threads are present and moderate  bacteria.  PVR 33 mL.  She underwent a cystoscopy for further evaluation of her microscopic hematuria with Dr. Apolinar Junes on August 18, 2022 and it was normal.  She was referred to gynecology for further evaluation of her vaginal discharge, but she has not had that appointment.   She continues to have vaginal discharge that is brown and mucousy.  She said this discharge has been going on for quite a while.  When I looked at her previous referral, it look like it was sent back in August 2023 by her PCP.  At her visit on 09/14/2022, she is having 1-7 voids, nocturia 3 or more with a severe urge to urinate.  She was having urinary leakage and both stress and urge.  She is leaking 3 or more times daily.  She is going through 5 to 6 depends daily.  She does not limit fluid intake.  She does engage in toilet mapping.   She has been experiencing intense urgency with urge incontinence for several weeks now.  She is not on any OAB meds at this time.   Patient denies any modifying or aggravating factors.  Patient denies any gross hematuria or suprapubic/flank pain.  Patient denies any fevers, chills, nausea or vomiting.    She was given a trial of Myrbetriq.  She did not see any more reduction in her urge severity or urinary leakage.  She feels it may have decreased trips to the bathroom.  She is having 1-7 daytime voids, nocturia x 1-2 with severe urge to urinate.  She  has incontinence with both stress and urge.  She is leaking 3 more times a day.  She wears absorbent pads reinforced with depends daily she does engage in toilet mapping.  Patient denies any modifying or aggravating factors.  Patient denies any recent UTI's, gross hematuria, dysuria or suprapubic/flank pain.  Patient denies any fevers, chills, nausea or vomiting.   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   Cough 03/02/2017   INTERMTTENT DRY COUGH   Depression    GERD (gastroesophageal reflux disease)    History of hiatal hernia    Personal history of chemotherapy    Personal history of radiation therapy    Skin cancer    Throat cancer (HCC) 1998   radiation   Wears dentures    full upper and lower    Surgical History: Past Surgical History:  Procedure Laterality Date   BREAST BIOPSY Right 1997   positive   BREAST BIOPSY Right 11/15/2017   INVASIVE MAMMARY CARCINOMA triple negative   BREAST BIOPSY Right 07/20/2020   [place] bx, Q marker, neg   BREAST LUMPECTOMY Right 1997   2019 also   BREAST LUMPECTOMY WITH SENTINEL LYMPH NODE BIOPSY Right 12/08/2017   Procedure: BREAST LUMPECTOMY WITH SENTINEL LYMPH NODE BX;  Surgeon: Earline Mayotte, MD;  Location: [facility];  Service: General;  Laterality: Right;   BREAST REDUCTION WITH MASTOPEXY Left 06/10/2019   Procedure: Left breast mastopexy reduction for symmetry;  Surgeon: Peggye Form, DO;  Location: [facility];  Service: Plastics;  Laterality: Left;  total 2.5 hours   CATARACT EXTRACTION W/PHACO Right 02/07/2022   Procedure: CATARACT EXTRACTION PHACO AND INTRAOCULAR LENS PLACEMENT (IOC) RIGHT DIABETIC;  Surgeon: Nevada Crane, MD;  Location: [facility];  Service: Ophthalmology;  Laterality: Right;  3.60 00:34.7   CATARACT EXTRACTION W/PHACO Left 02/28/2022   Procedure: CATARACT EXTRACTION PHACO AND INTRAOCULAR LENS PLACEMENT (IOC) LEFT DIABETIC 4.11 00:26.1;  Surgeon: Nevada Crane, MD;  Location: [facility];  Service: Ophthalmology;  Laterality: Left;   COLONOSCOPY  2015   COLONOSCOPY WITH PROPOFOL N/A 10/29/2019   Procedure: COLONOSCOPY WITH PROPOFOL;  Surgeon: Midge Minium, MD;  Location: [facility];  Service: Endoscopy;  Laterality: N/A;   ESOPHAGOGASTRODUODENOSCOPY (EGD) WITH  PROPOFOL N/A 05/24/2019   Procedure: ESOPHAGOGASTRODUODENOSCOPY (EGD) WITH PROPOFOL;  Surgeon: Midge Minium, MD;  Location: [facility];  Service: Endoscopy;  Laterality: N/A;   ESOPHAGOGASTRODUODENOSCOPY (EGD) WITH PROPOFOL N/A 09/06/2022   Procedure: ESOPHAGOGASTRODUODENOSCOPY (EGD) WITH PROPOFOL;  Surgeon: Midge Minium, MD;  Location: [facility];  Service: Endoscopy;  Laterality: N/A;   ESOPHAGUS SURGERY     KNEE SURGERY Right    LIPOSUCTION WITH LIPOFILLING Right 06/10/2019   Procedure: right breast scar and capsule release with fat grafting;  Surgeon: Peggye Form, DO;  Location: [facility];  Service: Plastics;  Laterality: Right;   PORT-A-CATH REMOVAL     PORTACATH PLACEMENT Right 01/17/2018   Procedure: INSERTION PORT-A-CATH- RIGHT;  Surgeon: Earline Mayotte, MD;  Location: [facility];  Service: General;  Laterality: Right;   ROBOTIC ADRENALECTOMY Right 12/28/2016   Procedure: ROBOTIC ADRENALECTOMY;  Surgeon: Vanna Scotland, MD;  Location: [facility];  Service: Urology;  Laterality: Right;   SKIN CANCER EXCISION     THROAT SURGERY  1998   throat cancer    TUBAL LIGATION     VENTRAL HERNIA REPAIR N/A 03/03/2017   Procedure: HERNIA REPAIR VENTRAL ADULT;  Surgeon: Ricarda Frame, MD;  Location: [facility];  Service: General;  Laterality: N/A;    Home Medications:  Allergies as of 11/02/2022       Reactions   Sulfa Antibiotics Other (See Comments)   Mouth sore and raw        Medication List        Accurate as of November 02, 2022 11:48 AM. If you have any questions, ask your nurse or doctor.          albuterol 108 (90 Base) MCG/ACT inhaler Commonly known as: VENTOLIN HFA Inhale 2 puffs into the lungs every 6 (six) hours as needed for shortness of breath.   albuterol (2.5 MG/3ML) 0.083% nebulizer solution Commonly known as: PROVENTIL Take 3 mLs (2.5 mg total) by nebulization every 6 (six) hours as needed for wheezing or shortness of breath.    alum & mag hydroxide-simeth 400-400-40 MG/5ML suspension Commonly known as: MAALOX PLUS Take 15 mLs by mouth every 6 (six) hours as needed for indigestion.   aspirin EC 81 MG tablet Take 1 tablet (81 mg total) by mouth daily. Swallow whole.   benzonatate 100 MG capsule Commonly known as: TESSALON TAKE 2 CAPSULES BY MOUTH 3 TIMES DAILY   Breztri Aerosphere 160-9-4.8 MCG/ACT Aero Generic drug: Budeson-Glycopyrrol-Formoterol Inhale 2 puffs into the lungs in the morning and at bedtime.   buPROPion ER 100 MG 12 hr tablet Commonly known as: Wellbutrin SR Take 1 tablet (100 mg total) by mouth 2 (two) times daily.   busPIRone 5 MG tablet Commonly known as: BUSPAR Take 1 tablet (5 mg total) by mouth 3 (three) times daily.   cyclobenzaprine 5 MG tablet Commonly known as: FLEXERIL TAKE 1 TABLET BY MOUTH AT BEDTIME   DULoxetine 60 MG capsule Commonly known as: CYMBALTA Take 1 capsule (60 mg total) by mouth 2 (two) times daily.   fluconazole 100 MG tablet Commonly known as: DIFLUCAN Take 1 tablet (100 mg total) by mouth daily.   fluticasone 50 MCG/ACT nasal spray Commonly known as: FLONASE Place 1 spray into both nostrils at bedtime.   gabapentin 300 MG capsule Commonly known as: NEURONTIN TAKE 1 CAPSULE BY MOUTH 3 TIMES DAILY   meloxicam 7.5 MG tablet Commonly known as: MOBIC TAKE 1 TABLET BY MOUTH DAILY   mirabegron ER 25 MG Tb24 tablet Commonly known as: MYRBETRIQ Take 1 tablet (25 mg total) by mouth daily.   montelukast 10 MG tablet Commonly known as: SINGULAIR Take 1 tablet (10 mg total) by mouth at bedtime.   nitrofurantoin (macrocrystal-monohydrate) 100 MG capsule Commonly known as: Macrobid Take 1 capsule (100 mg total) by mouth 2 (two) times daily.   omeprazole 40 MG capsule Commonly known as: PRILOSEC TAKE 1 CAPSULE BY MOUTH DAILY   rOPINIRole 0.25 MG tablet Commonly known as: REQUIP TAKE 1 TABLET BY MOUTH AT BEDTIME   rosuvastatin 40 MG  tablet Commonly known as: CRESTOR Take 1 tablet (40 mg total) by mouth daily.        Allergies:  Allergies  Allergen Reactions   Sulfa Antibiotics Other (See Comments)    Mouth sore and raw    Family History: Family History  Problem Relation Age of Onset   Diabetes Mother    Heart attack Mother  Cancer Sister        cancer of eyes, spread to lung, other places. died at 64   Cancer Sister 50       unknown primary, spread to bone, brain died in 55's   Cancer Paternal Aunt        unk type   Cancer Paternal Grandmother        type unk   Prostate cancer Neg Hx    Kidney cancer Neg Hx    Bladder Cancer Neg Hx    Breast cancer Neg Hx     Social History:  reports that she quit smoking about 27 years ago. Her smoking use included cigarettes. She has a 45.00 pack-year smoking history. She has never used smokeless tobacco. She reports that she does not currently use alcohol after a past usage of about 4.0 standard drinks of alcohol per week. She reports that she does not use drugs.  ROS: Pertinent ROS in HPI  Physical Exam: BP 111/76   Pulse 97   Wt 135 lb (61.2 kg)   BMI 25.51 kg/m   Constitutional:  Well nourished. Alert and oriented, No acute distress. HEENT: [place] AT, moist mucus membranes.  Trachea midline Cardiovascular: No clubbing, cyanosis, or edema. Respiratory: Normal respiratory effort, no increased work of breathing. Neurologic: Grossly intact, no focal deficits, moving all 4 extremities. Psychiatric: Normal mood and affect.    Laboratory Data: N/A     Pertinent Imaging:  11/02/22 11:18  Scan Result 0ml       Assessment & Plan:    1. Urge incontinence -she had failed Gemtesa -she has not had a good response with Myrbetriq 25 mg daily and it is somewhat cost prohibitive for her -She will increase the Myrbetriq 25 mg to 2 tablets daily to see if this is more effective for her -I will have her see Dr. Sherron Monday to see if she is a candidate for other  treatment modalities for her urge incontinence or if she needs further testing  2. High risk hematuria -former smoker -work up 2024 - NED -no reports of gross heme  3. Bloody vaginal discharge -Placed another referral for gynecology -Explained to the patient that the brown discharge may actually be a bloody discharge and since she still has a uterus in place and is postmenopausal it is important that she be seen and evaluated by gynecology to ensure that it is not a uterine malignancy -I have asked her to contact the office in 2 weeks if she has not heard from gynecology  Return for Appointent with Dr. Sherron Monday for refractory OAB wet .  These notes generated with voice recognition software. I apologize for typographical errors.  Cloretta Ned  [facility] [address]  [address] [place], [place] [address] [phone]

## 2022-11-02 ENCOUNTER — Ambulatory Visit: Payer: Medicare PPO | Admitting: Urology

## 2022-11-02 ENCOUNTER — Encounter: Payer: Self-pay | Admitting: Urology

## 2022-11-02 VITALS — BP 111/76 | HR 97 | Wt 135.0 lb

## 2022-11-02 DIAGNOSIS — N898 Other specified noninflammatory disorders of vagina: Secondary | ICD-10-CM

## 2022-11-02 DIAGNOSIS — N3941 Urge incontinence: Secondary | ICD-10-CM

## 2022-11-02 DIAGNOSIS — R319 Hematuria, unspecified: Secondary | ICD-10-CM

## 2022-11-02 LAB — BLADDER SCAN AMB NON-IMAGING

## 2022-11-07 ENCOUNTER — Ambulatory Visit: Payer: Self-pay | Admitting: *Deleted

## 2022-11-07 ENCOUNTER — Other Ambulatory Visit: Payer: Self-pay | Admitting: Family Medicine

## 2022-11-07 DIAGNOSIS — G894 Chronic pain syndrome: Secondary | ICD-10-CM

## 2022-11-07 NOTE — Patient Outreach (Signed)
  Care Coordination   Initial Visit Note   11/07/2022 Name: Denise Watts MRN: [id number] DOB: 1955-08-22  Denise Watts is a 67 y.o. year old female who sees Denise Kindle, FNP for primary care. I spoke with  Denise Watts by phone today.  What matters to the patients health and wellness today?  Patient contacted for appointment, however patient requested that the appointment be re-scheduled as she and spouse were not feeling well today. Appointment re-scheduled for 11/21/22.    Goals Addressed             This Visit's Progress    care coordination activities       Interventions Today    Flowsheet Row Most Recent Value  Chronic Disease   Chronic disease during today's visit Other  [depression]  General Interventions   General Interventions Discussed/Reviewed General Interventions Discussed  [[facility] care management program discussed]  Mental Health Interventions   Mental Health Discussed/Reviewed Mental Health Reviewed, Depression  [facility] health briefly discussed, patient requested to re-schedule, patient states that she was not feeling well today]  Safety Interventions   Safety Discussed/Reviewed Safety Discussed  [patient encouraged to contact [phone] or present to the emergency room in the event of a mental health crisis]              SDOH assessments and interventions completed:  No     Care Coordination Interventions:  Yes, provided   Follow up plan: Follow up call scheduled for 11/21/22    Encounter Outcome:  Pt. Visit Completed

## 2022-11-09 ENCOUNTER — Encounter: Payer: Self-pay | Admitting: Oncology

## 2022-11-14 ENCOUNTER — Encounter: Payer: Self-pay | Admitting: Oncology

## 2022-11-14 ENCOUNTER — Other Ambulatory Visit: Payer: Self-pay | Admitting: Family Medicine

## 2022-11-14 DIAGNOSIS — J411 Mucopurulent chronic bronchitis: Secondary | ICD-10-CM

## 2022-11-14 NOTE — Telephone Encounter (Signed)
Requested Prescriptions  Pending Prescriptions Disp Refills   benzonatate (TESSALON) 100 MG capsule [Pharmacy Med Name: BENZONATATE 100 MG CAP] 180 capsule 0    Sig: TAKE 2 CAPSULES BY MOUTH 3 TIMES DAILY     Ear, Nose, and Throat:  Antitussives/Expectorants Passed - 11/14/2022  9:26 AM      Passed - Valid encounter within last 12 months    Recent Outpatient Visits           1 month ago Severe depression [facility])   [facility] Merita Norton T, FNP   4 months ago Symptoms of upper respiratory infection (URI)   [facility] [place], [place], PA-C   5 months ago Lung nodules   [facility] Merita Norton T, FNP   8 months ago Chronic pain syndrome   [facility] Jacky Kindle, FNP   9 months ago Right arm numbness   [facility] [place], [place], PA-C       Future Appointments             In 1 week Alfredo Martinez, MD [facility]

## 2022-11-16 ENCOUNTER — Inpatient Hospital Stay: Payer: Medicare HMO

## 2022-11-17 ENCOUNTER — Inpatient Hospital Stay: Payer: Medicare HMO | Attending: Oncology

## 2022-11-17 DIAGNOSIS — Z23 Encounter for immunization: Secondary | ICD-10-CM

## 2022-11-17 DIAGNOSIS — E538 Deficiency of other specified B group vitamins: Secondary | ICD-10-CM

## 2022-11-17 DIAGNOSIS — D519 Vitamin B12 deficiency anemia, unspecified: Secondary | ICD-10-CM | POA: Diagnosis not present

## 2022-11-17 MED ORDER — CYANOCOBALAMIN 1000 MCG/ML IJ SOLN
1000.0000 ug | INTRAMUSCULAR | Status: DC
  Administered 2022-11-17: 1000 ug via INTRAMUSCULAR
  Filled 2022-11-17: qty 1

## 2022-11-21 ENCOUNTER — Encounter: Payer: Self-pay | Admitting: Urology

## 2022-11-21 ENCOUNTER — Ambulatory Visit: Payer: Self-pay | Admitting: *Deleted

## 2022-11-21 ENCOUNTER — Ambulatory Visit: Payer: Medicare HMO | Admitting: Urology

## 2022-11-21 VITALS — BP 110/72 | HR 96 | Ht 61.0 in | Wt 133.0 lb

## 2022-11-21 DIAGNOSIS — N3281 Overactive bladder: Secondary | ICD-10-CM

## 2022-11-21 DIAGNOSIS — N3946 Mixed incontinence: Secondary | ICD-10-CM | POA: Diagnosis not present

## 2022-11-21 NOTE — Patient Outreach (Signed)
  Care Coordination   Follow Up Visit Note   11/21/2022 Name: Denise Watts MRN: [id number] DOB: 02-07-1956  Denise Watts is a 67 y.o. year old female who sees Jacky Kindle, FNP for primary care. I spoke with  Denise Watts by phone today.  What matters to the patients health and wellness today?  Mental Health support and options on how to maintain her spouses safety and secure their assets related to online financial scamming   Goals Addressed             This Visit's Progress    Mental health support       Interventions Today    Flowsheet Row Most Recent Value  Chronic Disease   Chronic disease during today's visit Other  [depression]  General Interventions   General Interventions Discussed/Reviewed General Interventions Reviewed  Mental Health Interventions   Mental Health Discussed/Reviewed Mental Health Discussed, Coping Strategies, Depression  [patient discussed increased  depression related to relationship with spouse and possibly being scammed online. Discussed options to maintain safety and assets. Emotional support provided.]              SDOH assessments and interventions completed:  Yes  SDOH Interventions Today    Flowsheet Row Most Recent Value  SDOH Interventions   Food Insecurity Interventions Intervention Not Indicated  Housing Interventions Intervention Not Indicated  Transportation Interventions Intervention Not Indicated  Depression Interventions/Treatment  Medication        Care Coordination Interventions:  Yes, provided  11/29/22 Follow up plan: Follow up call scheduled for 11/29/22    Encounter Outcome:  Pt. Visit Completed

## 2022-11-21 NOTE — Patient Instructions (Signed)
Visit Information  Thank you for taking time to visit with me today. Please don't hesitate to contact me if I can be of assistance to you.   Following are the goals we discussed today:  Please continue to consider options discussed today related to securing your assets   Our next appointment is by telephone on 11/30/22 at 10am  Please call the care guide team at [phone] if you need to cancel or reschedule your appointment.   If you are experiencing a Mental Health or Behavioral Health Crisis or need someone to talk to, please call the Suicide and Crisis Lifeline: 988   Patient verbalizes understanding of instructions and care plan provided today and agrees to view in MyChart. Active MyChart status and patient understanding of how to access instructions and care plan via MyChart confirmed with patient.     Telephone follow up appointment with care management team member scheduled for: 11/29/22  Verna Czech, LCSW Clinical Social Worker  [facility] Care Management [phone]

## 2022-11-21 NOTE — Progress Notes (Signed)
11/21/2022 3:02 PM   Denise Watts Feb 10, 1956 [id number]  Referring provider: Jacky Kindle, FNP [address] [place],  [place] [address]  Chief Complaint  Patient presents with   Over Active Bladder    HPI: Denise Watts: Patient has urgency incontinence and failed Gemtesa.  She has Parkinson's and is on Myrbetriq.  She was cleared for microscopic hematuria with a Bosniak 2 cyst left kidney.  Normal cystoscopy April 2024.  She has vaginal discharge.  Saw gynecology in August 2023 for vaginal discharge.  Residual 0 mL.  Culture March 2024 negative  Today Patient leaks with coughing sneezing bending and lifting.  She has urge incontinence and foot on the floor syndrome.  Primary symptom is urge incontinence.  She has moderately severe bedwetting.  She wears 4-5 depends a day that are quite wet.  She voids every hour and cannot hold it for 2 hours.  She is up twice at night  Myrbetriq at higher dose is not helping.  Azo Standard may help some.  She gets 3-4 bladder infections a year with frequency and small volume voiding that would temporarily respond to antibiotics.  She has Parkinson's and has not had a hysterectomy.  Still has brownish vaginal discharge  On pelvic examination patient had mild hypermobility bladder neck with minimal cystocele.  No stress incontinence with moderate cough.  She had a narrow introitus and shortening of the urethra.  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   Cough 03/02/2017   INTERMTTENT DRY COUGH   Depression    GERD (gastroesophageal reflux disease)    History of hiatal hernia    Personal history of chemotherapy    Personal history of radiation therapy    Skin cancer    Throat cancer  (HCC) 1998   radiation   Wears dentures    full upper and lower    Surgical History: Past Surgical History:  Procedure Laterality Date   BREAST BIOPSY Right 1997   positive   BREAST BIOPSY Right 11/15/2017   INVASIVE MAMMARY CARCINOMA triple negative   BREAST BIOPSY Right 07/20/2020   [place] bx, Q marker, neg   BREAST LUMPECTOMY Right 1997   2019 also   BREAST LUMPECTOMY WITH SENTINEL LYMPH NODE BIOPSY Right 12/08/2017   Procedure: BREAST LUMPECTOMY WITH SENTINEL LYMPH NODE BX;  Surgeon: Earline Mayotte, MD;  Location: [facility];  Service: General;  Laterality: Right;   BREAST REDUCTION WITH MASTOPEXY Left 06/10/2019   Procedure: Left breast mastopexy reduction for symmetry;  Surgeon: Peggye Form, DO;  Location: [facility];  Service: Plastics;  Laterality: Left;  total 2.5 hours   CATARACT EXTRACTION W/PHACO Right 02/07/2022   Procedure: CATARACT EXTRACTION PHACO AND INTRAOCULAR LENS PLACEMENT (IOC) RIGHT DIABETIC;  Surgeon: Nevada Crane, MD;  Location: [facility];  Service: Ophthalmology;  Laterality: Right;  3.60 00:34.7   CATARACT EXTRACTION W/PHACO Left 02/28/2022   Procedure: CATARACT EXTRACTION PHACO AND INTRAOCULAR LENS PLACEMENT (IOC) LEFT DIABETIC 4.11 00:26.1;  Surgeon: Nevada Crane, MD;  Location: [facility];  Service: Ophthalmology;  Laterality: Left;   COLONOSCOPY  2015   COLONOSCOPY WITH PROPOFOL N/A 10/29/2019   Procedure: COLONOSCOPY WITH  PROPOFOL;  Surgeon: Midge Minium, MD;  Location: [facility];  Service: Endoscopy;  Laterality: N/A;   ESOPHAGOGASTRODUODENOSCOPY (EGD) WITH PROPOFOL N/A 05/24/2019   Procedure: ESOPHAGOGASTRODUODENOSCOPY (EGD) WITH PROPOFOL;  Surgeon: Midge Minium, MD;  Location: [facility];  Service: Endoscopy;  Laterality: N/A;   ESOPHAGOGASTRODUODENOSCOPY (EGD) WITH PROPOFOL N/A 09/06/2022   Procedure: ESOPHAGOGASTRODUODENOSCOPY (EGD) WITH PROPOFOL;  Surgeon: Midge Minium, MD;  Location: [facility];  Service: Endoscopy;  Laterality: N/A;   ESOPHAGUS SURGERY     KNEE SURGERY Right    LIPOSUCTION WITH LIPOFILLING Right 06/10/2019   Procedure: right breast scar and capsule release with fat grafting;  Surgeon: Peggye Form, DO;  Location: [facility];  Service: Plastics;  Laterality: Right;   PORT-A-CATH REMOVAL     PORTACATH PLACEMENT Right 01/17/2018   Procedure: INSERTION PORT-A-CATH- RIGHT;  Surgeon: Earline Mayotte, MD;  Location: [facility];  Service: General;  Laterality: Right;   ROBOTIC ADRENALECTOMY Right 12/28/2016   Procedure: ROBOTIC ADRENALECTOMY;  Surgeon: Vanna Scotland, MD;  Location: [facility];  Service: Urology;  Laterality: Right;   SKIN CANCER EXCISION     THROAT SURGERY  1998   throat cancer    TUBAL LIGATION     VENTRAL HERNIA REPAIR N/A 03/03/2017   Procedure: HERNIA REPAIR VENTRAL ADULT;  Surgeon: Ricarda Frame, MD;  Location: [facility];  Service: General;  Laterality: N/A;    Home Medications:  Allergies as of 11/21/2022       Reactions   Sulfa Antibiotics Other (See Comments)   Mouth sore and raw        Medication List        Accurate as of November 21, 2022  3:02 PM. If you have any questions, ask your nurse or doctor.          STOP taking these medications    benzonatate 100 MG capsule Commonly known as: TESSALON Stopped by: Lorin Picket A Faylynn Stamos   fluconazole 100 MG tablet Commonly known as: DIFLUCAN Stopped by: Lorin Picket A Emett Stapel   nitrofurantoin (macrocrystal-monohydrate) 100 MG capsule Commonly known as: Macrobid Stopped by: Lorin Picket A Rc Amison       TAKE these medications    albuterol 108 (90 Base) MCG/ACT inhaler Commonly known as: VENTOLIN HFA Inhale 2 puffs into the lungs every 6 (six) hours as needed for shortness of breath.   albuterol (2.5 MG/3ML) 0.083% nebulizer solution Commonly known as: PROVENTIL Take 3 mLs (2.5 mg total) by nebulization every 6 (six) hours as needed for wheezing or  shortness of breath.   alum & mag hydroxide-simeth 400-400-40 MG/5ML suspension Commonly known as: MAALOX PLUS Take 15 mLs by mouth every 6 (six) hours as needed for indigestion.   aspirin EC 81 MG tablet Take 1 tablet (81 mg total) by mouth daily. Swallow whole.   Breztri Aerosphere 160-9-4.8 MCG/ACT Aero Generic drug: Budeson-Glycopyrrol-Formoterol Inhale 2 puffs into the lungs in the morning and at bedtime.   buPROPion ER 100 MG 12 hr tablet Commonly known as: Wellbutrin SR Take 1 tablet (100 mg total) by mouth 2 (two) times daily.   busPIRone 5 MG tablet Commonly known as: BUSPAR Take 1 tablet (5 mg total) by mouth 3 (three) times daily.   cyclobenzaprine 5 MG tablet Commonly known as: FLEXERIL TAKE 1 TABLET BY MOUTH AT BEDTIME   DULoxetine 60 MG capsule Commonly known as: CYMBALTA Take 1 capsule (60 mg total) by mouth 2 (two) times daily.   fluticasone 50 MCG/ACT nasal spray Commonly known as:  FLONASE Place 1 spray into both nostrils at bedtime.   gabapentin 300 MG capsule Commonly known as: NEURONTIN TAKE 1 CAPSULE BY MOUTH 3 TIMES DAILY   meloxicam 7.5 MG tablet Commonly known as: MOBIC TAKE 1 TABLET BY MOUTH DAILY   montelukast 10 MG tablet Commonly known as: SINGULAIR Take 1 tablet (10 mg total) by mouth at bedtime.   Myrbetriq 25 MG Tb24 tablet Generic drug: mirabegron ER Take 25 mg by mouth. 2 tablets daily What changed: Another medication with the same name was removed. Continue taking this medication, and follow the directions you see here. Changed by: Lorin Picket A Tahra Hitzeman   omeprazole 40 MG capsule Commonly known as: PRILOSEC TAKE 1 CAPSULE BY MOUTH DAILY   rOPINIRole 0.25 MG tablet Commonly known as: REQUIP TAKE 1 TABLET BY MOUTH AT BEDTIME   rosuvastatin 40 MG tablet Commonly known as: CRESTOR Take 1 tablet (40 mg total) by mouth daily.        Allergies:  Allergies  Allergen Reactions   Sulfa Antibiotics Other (See Comments)     Mouth sore and raw    Family History: Family History  Problem Relation Age of Onset   Diabetes Mother    Heart attack Mother    Cancer Sister        cancer of eyes, spread to lung, other places. died at 44   Cancer Sister 43       unknown primary, spread to bone, brain died in 86's   Cancer Paternal Aunt        unk type   Cancer Paternal Grandmother        type unk   Prostate cancer Neg Hx    Kidney cancer Neg Hx    Bladder Cancer Neg Hx    Breast cancer Neg Hx     Social History:  reports that she quit smoking about 27 years ago. Her smoking use included cigarettes. She started smoking about 57 years ago. She has a 45 pack-year smoking history. She has been exposed to tobacco smoke. She has never used smokeless tobacco. She reports that she does not currently use alcohol after a past usage of about 4.0 standard drinks of alcohol per week. She reports that she does not use drugs.  ROS:                                        Physical Exam: BP 110/72   Pulse 96   Ht 5\' 1"  (1.549 m)   Wt 60.3 kg   BMI 25.13 kg/m   Constitutional:  Alert and oriented, No acute distress. HEENT: [place] AT, moist mucus membranes.  Trachea midline, no masses.   Laboratory Data: Lab Results  Component Value Date   WBC 4.2 09/14/2022   HGB 13.4 09/14/2022   HCT 41.9 09/14/2022   MCV 99.3 09/14/2022   PLT 166 09/14/2022    Lab Results  Component Value Date   CREATININE 0.73 03/16/2022    No results found for: "PSA"  No results found for: "TESTOSTERONE"  Lab Results  Component Value Date   HGBA1C 5.7 (H) 11/03/2021    Urinalysis    Component Value Date/Time   COLORURINE AMBER (A) 09/01/2021 1039   APPEARANCEUR Clear 08/18/2022 1104   LABSPEC 1.028 09/01/2021 1039   PHURINE 5.0 09/01/2021 1039   GLUCOSEU Negative 08/18/2022 1104   HGBUR NEGATIVE 09/01/2021 1039   BILIRUBINUR  Negative 08/18/2022 1104   KETONESUR negative 11/05/2021 1736   KETONESUR  NEGATIVE 09/01/2021 1039   PROTEINUR Negative 08/18/2022 1104   PROTEINUR NEGATIVE 09/01/2021 1039   UROBILINOGEN 0.2 11/05/2021 1736   NITRITE Negative 08/18/2022 1104   NITRITE NEGATIVE 09/01/2021 1039   LEUKOCYTESUR Negative 08/18/2022 1104   LEUKOCYTESUR NEGATIVE 09/01/2021 1039    Pertinent Imaging: Urine reviewed and sent for culture.  Chart reviewed  Assessment & Plan: Patient has mixed incontinence and bedwetting.  She has Parkinson's.  She gets bladder infections.  Call if culture positive.  Role of urodynamics discussed.  See gynecology for discharge.  She agreed with the plan.  Treating stress incontinence may not be in her best interest if she has a neurogenic bladder with Parkinson's and she is motivated to get as dry as possible    1. OAB (overactive bladder)  - Urinalysis, Complete   No follow-ups on file.  Martina Sinner, MD  [facility] [address], [address] [place], [place] [address] [phone]

## 2022-11-22 LAB — MICROSCOPIC EXAMINATION: Bacteria, UA: NONE SEEN

## 2022-11-22 LAB — URINALYSIS, COMPLETE
Bilirubin, UA: NEGATIVE
Glucose, UA: NEGATIVE
Leukocytes,UA: NEGATIVE
Nitrite, UA: NEGATIVE
RBC, UA: NEGATIVE
Specific Gravity, UA: >1.03 — ABNORMAL HIGH (ref 1.005–1.030)
Urobilinogen, Ur: 1 mg/dL (ref 0.2–1.0)
pH, UA: 5 (ref 5.0–7.5)

## 2022-11-23 LAB — CULTURE, URINE COMPREHENSIVE

## 2022-11-24 LAB — CULTURE, URINE COMPREHENSIVE

## 2022-11-25 ENCOUNTER — Other Ambulatory Visit: Payer: Self-pay | Admitting: Family Medicine

## 2022-11-28 ENCOUNTER — Other Ambulatory Visit: Payer: Self-pay

## 2022-11-28 MED ORDER — CIPROFLOXACIN HCL 250 MG PO TABS: 250.0000 mg | ORAL_TABLET | Freq: Two times a day (BID) | ORAL | 0 refills | Status: DC

## 2022-11-29 DIAGNOSIS — D0471 Carcinoma in situ of skin of right lower limb, including hip: Secondary | ICD-10-CM | POA: Diagnosis not present

## 2022-11-30 ENCOUNTER — Ambulatory Visit: Payer: Self-pay | Admitting: *Deleted

## 2022-11-30 NOTE — Patient Instructions (Signed)
Visit Information  Thank you for taking time to visit with me today. Please don't hesitate to contact me if I can be of assistance to you.   Following are the goals we discussed today:  Please continue to consider options including contact the financial crimes department through the police department and identifying a representative payee for family member   Our next appointment is by telephone on 12/21/22 at 11am  Please call the care guide team at [phone] if you need to cancel or reschedule your appointment.   If you are experiencing a Mental Health or Behavioral Health Crisis or need someone to talk to, please call the Suicide and Crisis Lifeline: 988   Patient verbalizes understanding of instructions and care plan provided today and agrees to view in MyChart. Active MyChart status and patient understanding of how to access instructions and care plan via MyChart confirmed with patient.     Telephone follow up appointment with care management team member scheduled for: 12/21/22  Verna Czech, LCSW Clinical Social Worker  [facility] [phone]

## 2022-11-30 NOTE — Patient Outreach (Signed)
  Care Coordination   Follow Up Visit Note   11/30/2022 Name: Denise Watts MRN: [id number] DOB: [birth year]  Denise Watts is a 67 y.o. year old female who sees Jacky Kindle, FNP for primary care. I spoke with  Denise Watts by phone today.  What matters to the patients health and wellness today?  Emotional support related to family conflict    Goals Addressed             This Visit's Progress    Mental health support       Interventions Today    Flowsheet Row Most Recent Value  Chronic Disease   Chronic disease during today's visit Other  [depression]  General Interventions   General Interventions Discussed/Reviewed General Interventions Reviewed, Community Resources  Mental Health Interventions   Mental Health Discussed/Reviewed Mental Health Reviewed, Coping Strategies, Depression  [emotional support continues to be provided related to conflicts with spouse and being potentially scammed-discussed contacting the police depts. financial crimes division or considering a representative payee-counseling discussed, however declined]  Safety Interventions   Safety Discussed/Reviewed Safety Discussed              SDOH assessments and interventions completed:  No     Care Coordination Interventions:  Yes, provided   Follow up plan: Follow up call scheduled for 12/21/22    Encounter Outcome:  Pt. Visit Completed

## 2022-12-05 ENCOUNTER — Ambulatory Visit (INDEPENDENT_AMBULATORY_CARE_PROVIDER_SITE_OTHER): Payer: Medicare HMO

## 2022-12-05 VITALS — Ht 61.0 in | Wt 132.0 lb

## 2022-12-05 DIAGNOSIS — Z0000 Encounter for general adult medical examination without abnormal findings: Secondary | ICD-10-CM

## 2022-12-05 DIAGNOSIS — Z13820 Encounter for screening for osteoporosis: Secondary | ICD-10-CM

## 2022-12-05 DIAGNOSIS — Z1231 Encounter for screening mammogram for malignant neoplasm of breast: Secondary | ICD-10-CM

## 2022-12-05 DIAGNOSIS — Z1211 Encounter for screening for malignant neoplasm of colon: Secondary | ICD-10-CM

## 2022-12-05 NOTE — Patient Instructions (Signed)
Denise Watts , Thank you for taking time to come for your Medicare Wellness Visit. I appreciate your ongoing commitment to your health goals. Please review the following plan we discussed and let me know if I can assist you in the future.   Referrals/Orders/Follow-Ups/Clinician Recommendations: appt with PCP made  This is a list of the screening recommended for you and due dates:  Health Maintenance  Topic Date Due   DTaP/Tdap/Td vaccine (1 - Tdap) Never done   Zoster (Shingles) Vaccine (1 of 2) Never done   DEXA scan (bone density measurement)  Never done   COVID-19 Vaccine (4 - 2023-24 season) 12/31/2021   Colon Cancer Screening  10/29/2022   Flu Shot  12/01/2022   Medicare Annual Wellness Visit  12/05/2023   Mammogram  03/07/2024   Pneumonia Vaccine  Completed   Hepatitis C Screening  Completed   HPV Vaccine  Aged Out    Advanced directives: (Declined) Advance directive discussed with you today. Even though you declined this today, please call our office should you change your mind, and we can give you the proper paperwork for you to fill out.  Next Medicare Annual Wellness Visit scheduled for next year: Yes 12/06/23 @ 1pm telephone  Preventive Care 65 Years and Older, Female Preventive care refers to lifestyle choices and visits with your health care provider that can promote health and wellness. What does preventive care include? A yearly physical exam. This is also called an annual well check. Dental exams once or twice a year. Routine eye exams. Ask your health care provider how often you should have your eyes checked. Personal lifestyle choices, including: Daily care of your teeth and gums. Regular physical activity. Eating a healthy diet. Avoiding tobacco and drug use. Limiting alcohol use. Practicing safe sex. Taking low-dose aspirin every day. Taking vitamin and mineral supplements as recommended by your health care provider. What happens during an annual well check? The  services and screenings done by your health care provider during your annual well check will depend on your age, overall health, lifestyle risk factors, and family history of disease. Counseling  Your health care provider may ask you questions about your: Alcohol use. Tobacco use. Drug use. Emotional well-being. Home and relationship well-being. Sexual activity. Eating habits. History of falls. Memory and ability to understand (cognition). Work and work Astronomer. Reproductive health. Screening  You may have the following tests or measurements: Height, weight, and BMI. Blood pressure. Lipid and cholesterol levels. These may be checked every 5 years, or more frequently if you are over 73 years old. Skin check. Lung cancer screening. You may have this screening every year starting at age 47 if you have a 30-pack-year history of smoking and currently smoke or have quit within the past 15 years. Fecal occult blood test (FOBT) of the stool. You may have this test every year starting at age 67. Flexible sigmoidoscopy or colonoscopy. You may have a sigmoidoscopy every 5 years or a colonoscopy every 10 years starting at age 38. Hepatitis C blood test. Hepatitis B blood test. Sexually transmitted disease (STD) testing. Diabetes screening. This is done by checking your blood sugar (glucose) after you have not eaten for a while (fasting). You may have this done every 1-3 years. Bone density scan. This is done to screen for osteoporosis. You may have this done starting at age 86. Mammogram. This may be done every 1-2 years. Talk to your health care provider about how often you should have regular mammograms.  Talk with your health care provider about your test results, treatment options, and if necessary, the need for more tests. Vaccines  Your health care provider may recommend certain vaccines, such as: Influenza vaccine. This is recommended every year. Tetanus, diphtheria, and acellular  pertussis (Tdap, Td) vaccine. You may need a Td booster every 10 years. Zoster vaccine. You may need this after age 19. Pneumococcal 13-valent conjugate (PCV13) vaccine. One dose is recommended after age 76. Pneumococcal polysaccharide (PPSV23) vaccine. One dose is recommended after age 44. Talk to your health care provider about which screenings and vaccines you need and how often you need them. This information is not intended to replace advice given to you by your health care provider. Make sure you discuss any questions you have with your health care provider. Document Released: 05/15/2015 Document Revised: 01/06/2016 Document Reviewed: 02/17/2015 Elsevier Interactive Patient Education  2017 [employer].  Fall Prevention in the Home Falls can cause injuries. They can happen to people of all ages. There are many things you can do to make your home safe and to help prevent falls. What can I do on the outside of my home? Regularly fix the edges of walkways and driveways and fix any cracks. Remove anything that might make you trip as you walk through a door, such as a raised step or threshold. Trim any bushes or trees on the path to your home. Use bright outdoor lighting. Clear any walking paths of anything that might make someone trip, such as rocks or tools. Regularly check to see if handrails are loose or broken. Make sure that both sides of any steps have handrails. Any raised decks and porches should have guardrails on the edges. Have any leaves, snow, or ice cleared regularly. Use sand or salt on walking paths during winter. Clean up any spills in your garage right away. This includes oil or grease spills. What can I do in the bathroom? Use night lights. Install grab bars by the toilet and in the tub and shower. Do not use towel bars as grab bars. Use non-skid mats or decals in the tub or shower. If you need to sit down in the shower, use a plastic, non-slip stool. Keep the floor  dry. Clean up any water that spills on the floor as soon as it happens. Remove soap buildup in the tub or shower regularly. Attach bath mats securely with double-sided non-slip rug tape. Do not have throw rugs and other things on the floor that can make you trip. What can I do in the bedroom? Use night lights. Make sure that you have a light by your bed that is easy to reach. Do not use any sheets or blankets that are too big for your bed. They should not hang down onto the floor. Have a firm chair that has side arms. You can use this for support while you get dressed. Do not have throw rugs and other things on the floor that can make you trip. What can I do in the kitchen? Clean up any spills right away. Avoid walking on wet floors. Keep items that you use a lot in easy-to-reach places. If you need to reach something above you, use a strong step stool that has a grab bar. Keep electrical cords out of the way. Do not use floor polish or wax that makes floors slippery. If you must use wax, use non-skid floor wax. Do not have throw rugs and other things on the floor that can make  you trip. What can I do with my stairs? Do not leave any items on the stairs. Make sure that there are handrails on both sides of the stairs and use them. Fix handrails that are broken or loose. Make sure that handrails are as long as the stairways. Check any carpeting to make sure that it is firmly attached to the stairs. Fix any carpet that is loose or worn. Avoid having throw rugs at the top or bottom of the stairs. If you do have throw rugs, attach them to the floor with carpet tape. Make sure that you have a light switch at the top of the stairs and the bottom of the stairs. If you do not have them, ask someone to add them for you. What else can I do to help prevent falls? Wear shoes that: Do not have high heels. Have rubber bottoms. Are comfortable and fit you well. Are closed at the toe. Do not wear  sandals. If you use a stepladder: Make sure that it is fully opened. Do not climb a closed stepladder. Make sure that both sides of the stepladder are locked into place. Ask someone to hold it for you, if possible. Clearly mark and make sure that you can see: Any grab bars or handrails. First and last steps. Where the edge of each step is. Use tools that help you move around (mobility aids) if they are needed. These include: Canes. Walkers. Scooters. Crutches. Turn on the lights when you go into a dark area. Replace any light bulbs as soon as they burn out. Set up your furniture so you have a clear path. Avoid moving your furniture around. If any of your floors are uneven, fix them. If there are any pets around you, be aware of where they are. Review your medicines with your doctor. Some medicines can make you feel dizzy. This can increase your chance of falling. Ask your doctor what other things that you can do to help prevent falls. This information is not intended to replace advice given to you by your health care provider. Make sure you discuss any questions you have with your health care provider. Document Released: 02/12/2009 Document Revised: 09/24/2015 Document Reviewed: 05/23/2014 Elsevier Interactive Patient Education  2017 [employer].

## 2022-12-05 NOTE — Progress Notes (Signed)
Subjective:   Denise Watts is a 67 y.o. female who presents for Medicare Annual (Subsequent) preventive examination.  Visit Complete: Virtual  I connected with  Denise Watts on 12/05/22 by a audio enabled telemedicine application and verified that I am speaking with the correct person using two identifiers.  Patient Location: Home  Provider Location: Home Office  I discussed the limitations of evaluation and management by telemedicine. The patient expressed understanding and agreed to proceed.  Vital Signs: Unable to obtain new vitals due to this being a telehealth visit.  Patient Medicare AWV questionnaire was completed by the patient on (not done); I have confirmed that all information answered by patient is correct and no changes since this date.  Review of Systems     Cardiac Risk Factors include: advanced age (>81men, >15 women)     Objective:    Today's Vitals   12/05/22 1306 12/05/22 1307  Weight: 132 lb (59.9 kg)   PainSc:  5    Body mass index is 24.94 kg/m.     12/05/2022    1:22 PM 09/14/2022   11:31 AM 09/06/2022    7:56 AM 03/16/2022   10:03 AM 12/02/2021    1:49 PM 09/14/2021    1:00 PM 09/09/2021    1:00 PM  Advanced Directives  Does Patient Have a Medical Advance Directive? No No No No No No No  Would patient like information on creating a medical advance directive?  No - Patient declined  No - Guardian declined No - Patient declined No - Patient declined No - Patient declined    Current Medications (verified) Outpatient Encounter Medications as of 12/05/2022  Medication Sig   albuterol (PROVENTIL) (2.5 MG/3ML) 0.083% nebulizer solution Take 3 mLs (2.5 mg total) by nebulization every 6 (six) hours as needed for wheezing or shortness of breath.   albuterol (VENTOLIN HFA) 108 (90 Base) MCG/ACT inhaler Inhale 2 puffs into the lungs every 6 (six) hours as needed for shortness of breath.   alum & mag hydroxide-simeth (MAALOX PLUS) 400-400-40 MG/5ML suspension  Take 15 mLs by mouth every 6 (six) hours as needed for indigestion.   aspirin EC 81 MG tablet Take 1 tablet (81 mg total) by mouth daily. Swallow whole.   Budeson-Glycopyrrol-Formoterol (BREZTRI AEROSPHERE) 160-9-4.8 MCG/ACT AERO Inhale 2 puffs into the lungs in the morning and at bedtime.   buPROPion ER (WELLBUTRIN SR) 100 MG 12 hr tablet Take 1 tablet (100 mg total) by mouth 2 (two) times daily.   busPIRone (BUSPAR) 5 MG tablet Take 1 tablet (5 mg total) by mouth 3 (three) times daily.   ciprofloxacin (CIPRO) 250 MG tablet Take 1 tablet (250 mg total) by mouth 2 (two) times daily.   cyclobenzaprine (FLEXERIL) 5 MG tablet TAKE 1 TABLET BY MOUTH AT BEDTIME   DULoxetine (CYMBALTA) 60 MG capsule Take 1 capsule (60 mg total) by mouth 2 (two) times daily.   fluticasone (FLONASE) 50 MCG/ACT nasal spray Place 1 spray into both nostrils at bedtime.   gabapentin (NEURONTIN) 300 MG capsule TAKE 1 CAPSULE BY MOUTH 3 TIMES DAILY   meloxicam (MOBIC) 7.5 MG tablet TAKE 1 TABLET BY MOUTH DAILY   mirabegron ER (MYRBETRIQ) 25 MG TB24 tablet Take 25 mg by mouth. 2 tablets daily (Patient not taking: Reported on 12/05/2022)   montelukast (SINGULAIR) 10 MG tablet Take 1 tablet (10 mg total) by mouth at bedtime.   omeprazole (PRILOSEC) 40 MG capsule TAKE 1 CAPSULE BY MOUTH DAILY  rOPINIRole (REQUIP) 0.25 MG tablet TAKE 1 TABLET BY MOUTH AT BEDTIME   rosuvastatin (CRESTOR) 40 MG tablet Take 1 tablet (40 mg total) by mouth daily.   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   Cough 03/02/2017   INTERMTTENT DRY COUGH   Depression    GERD (gastroesophageal reflux disease)    History of hiatal hernia    Personal history of chemotherapy    Personal history of radiation therapy    Skin cancer    Throat cancer (HCC) 1998   radiation   Wears dentures    full upper and lower   Past Surgical History:  Procedure Laterality Date   BREAST BIOPSY Right 1997   positive   BREAST BIOPSY Right 11/15/2017   INVASIVE MAMMARY CARCINOMA triple negative   BREAST BIOPSY Right 07/20/2020   [place] bx, Q marker, neg   BREAST LUMPECTOMY Right 1997   2019 also   BREAST LUMPECTOMY WITH SENTINEL LYMPH NODE BIOPSY Right 12/08/2017   Procedure: BREAST LUMPECTOMY WITH SENTINEL LYMPH NODE BX;  Surgeon: Earline Mayotte, MD;  Location: [facility];  Service: General;  Laterality: Right;   BREAST REDUCTION WITH MASTOPEXY Left 06/10/2019   Procedure: Left breast mastopexy reduction for symmetry;  Surgeon: Peggye Form, DO;  Location: [facility];  Service: Plastics;  Laterality: Left;  total 2.5 hours   CATARACT EXTRACTION W/PHACO Right 02/07/2022   Procedure: CATARACT EXTRACTION PHACO AND INTRAOCULAR LENS PLACEMENT (IOC) RIGHT DIABETIC;  Surgeon: Nevada Crane, MD;  Location: [facility];  Service: Ophthalmology;  Laterality: Right;  3.60 00:34.7   CATARACT EXTRACTION W/PHACO Left 02/28/2022   Procedure: CATARACT EXTRACTION PHACO AND INTRAOCULAR LENS PLACEMENT (IOC) LEFT DIABETIC 4.11 00:26.1;  Surgeon: Nevada Crane, MD;  Location: [facility];  Service: Ophthalmology;  Laterality: Left;   COLONOSCOPY  2015   COLONOSCOPY WITH PROPOFOL N/A 10/29/2019   Procedure: COLONOSCOPY WITH PROPOFOL;  Surgeon: Midge Minium, MD;  Location: [facility];  Service: Endoscopy;  Laterality: N/A;   ESOPHAGOGASTRODUODENOSCOPY (EGD) WITH  PROPOFOL N/A 05/24/2019   Procedure: ESOPHAGOGASTRODUODENOSCOPY (EGD) WITH PROPOFOL;  Surgeon: Midge Minium, MD;  Location: [facility];  Service: Endoscopy;  Laterality: N/A;   ESOPHAGOGASTRODUODENOSCOPY (EGD) WITH PROPOFOL N/A 09/06/2022   Procedure: ESOPHAGOGASTRODUODENOSCOPY (EGD) WITH PROPOFOL;  Surgeon: Midge Minium, MD;  Location: [facility];  Service: Endoscopy;  Laterality: N/A;   ESOPHAGUS SURGERY     KNEE SURGERY Right    LIPOSUCTION WITH LIPOFILLING Right 06/10/2019   Procedure: right breast scar and capsule release with fat grafting;  Surgeon: Peggye Form, DO;  Location:  SURGERY CENTER;  Service: Plastics;  Laterality: Right;   PORT-A-CATH REMOVAL     PORTACATH PLACEMENT Right 01/17/2018   Procedure: INSERTION PORT-A-CATH- RIGHT;  Surgeon: Earline Mayotte, MD;  Location: [facility];  Service: General;  Laterality: Right;   ROBOTIC ADRENALECTOMY Right 12/28/2016   Procedure: ROBOTIC ADRENALECTOMY;  Surgeon: Vanna Scotland, MD;  Location: [facility];  Service: Urology;  Laterality: Right;   SKIN CANCER EXCISION     THROAT SURGERY  1998   throat cancer    TUBAL LIGATION     VENTRAL HERNIA REPAIR N/A 03/03/2017   Procedure: HERNIA REPAIR VENTRAL ADULT;  Surgeon: Ricarda Frame, MD;  Location: [facility];  Service: General;  Laterality: N/A;   Family History  Problem Relation Age of Onset   Diabetes Mother    Heart attack Mother    Cancer Sister        cancer of eyes, spread to lung, other places. died at 4   Cancer Sister 55       unknown primary, spread to bone, brain died in 37's   Cancer Paternal Aunt        unk type   Cancer Paternal Grandmother        type unk   Prostate cancer Neg Hx    Kidney cancer Neg Hx    Bladder Cancer Neg Hx    Breast cancer Neg Hx    Social History   Socioeconomic History   Marital status: Married    Spouse name: Not on file   Number of children: 4   Years of education: Not on file   Highest education level:  10th grade  Occupational History   Occupation: retired  Tobacco Use   Smoking status: Former    Current packs/day: 0.00    Average packs/day: 1.5 packs/day for 30.0 years (45.0 ttl pk-yrs)    Types: Cigarettes    Start date: 05/02/1965    Quit date: 05/03/1995    Years since quitting: 27.6    Passive exposure: Past   Smokeless tobacco: Never  Vaping Use   Vaping status: Never Used  Substance and Sexual Activity   Alcohol use: Not Currently    Alcohol/week: 4.0 standard drinks of alcohol    Types: 4 Shots of liquor per week    Comment: none   Drug use: No   Sexual activity: Not Currently    Birth control/protection: Post-menopausal  Other Topics Concern   Not on file  Social History Narrative   Not on file   Social Determinants of Health   Financial Resource Strain: Low Risk  (12/02/2021)   Overall Financial Resource Strain (CARDIA)    Difficulty of Paying Living Expenses: Not very hard  Food Insecurity: No Food Insecurity (12/05/2022)   Hunger Vital Sign    Worried About Running Out of Food in the Last Year: Never true    Ran Out of Food in the Last Year: Never true  Transportation Needs: No Transportation Needs (12/05/2022)   PRAPARE - Administrator, Civil Service (Medical): No    Lack of Transportation (Non-Medical): No  Physical Activity: Insufficiently Active (12/05/2022)   Exercise Vital Sign    Days of Exercise per Week: 3 days    Minutes of Exercise per Session: 30 min  Stress: Stress Concern Present (12/05/2022)   [employer] of Occupational Health - Occupational Stress Questionnaire    Feeling of Stress : Rather much  Social Connections: Moderately Isolated (12/05/2022)   Social Connection and Isolation Panel [NHANES]    Frequency of Communication with Friends and Family: Three times a week    Frequency of Social Gatherings with Friends and Family: Twice a week    Attends Religious Services: Never    Database administrator or Organizations: No     Attends [employer]  or Organization Meetings: Never    Marital Status: Married    Tobacco Counseling Counseling given: Not Answered   Clinical Intake:  Pre-visit preparation completed: Yes  Pain : 0-10 Pain Score: 5  Pain Type: Acute pain Pain Onset: In the past 7 days Pain Frequency: Constant Pain Relieving Factors: ice pack, IBU  Pain Relieving Factors: ice pack, IBU  BMI - recorded: 24.94 Nutritional Status: BMI of 19-24  Normal Nutritional Risks: None Diabetes: No  How often do you need to have someone help you when you read instructions, pamphlets, or other written materials from your doctor or pharmacy?: 1 - Never  Interpreter Needed?: No  Comments: lives with husband Information entered by :: B.,Denise Watts   Activities of Daily Living    12/05/2022    1:22 PM 10/12/2022   10:46 AM  In your present state of health, do you have any difficulty performing the following activities:  Hearing? 0 0  Vision? 1 0  Difficulty concentrating or making decisions? 0 0  Walking or climbing stairs? 1 1  Dressing or bathing? 0 0  Doing errands, shopping? 1 1  Preparing Food and eating ? N   Using the Toilet? N   In the past six months, have you accidently leaked urine? Y   Do you have problems with loss of bowel control? N   Managing your Medications? N   Managing your Finances? N   Housekeeping or managing your Housekeeping? Y     Patient Care Team: Jacky Kindle, FNP as PCP - General (Family Medicine) Debbe Odea, MD as PCP - Cardiology (Cardiology) Creig Hines, MD as Consulting Physician (Oncology) Leafy Ro, MD as Consulting Physician (General Surgery) Dasher, Cliffton Asters, MD (Dermatology) Scheeler, Kermit Balo, PA-C as Physician Assistant (Plastic Surgery) Minna Antis, MD as Attending Physician (Emergency Medicine) Harlin Rain Wandra Arthurs (Neurology) Dayna Barker, MD as Referring Physician (Internal Medicine) Coralyn Helling, MD as Consulting  Physician (Pulmonary Disease) Gaspar Cola, RPH (Inactive) (Pharmacist) Pa, [facility] (Optometry)  Indicate any recent Medical Services you may have received from other than [facility] providers in the past year (date may be approximate).     Assessment:   This is a routine wellness examination for Denise Watts.  Hearing/Vision screen Hearing Screening - Comments:: Adequate hearing Vision Screening - Comments:: Blurry vision in rt eye;cataract surgery  Dietary issues and exercise activities discussed:     Goals Addressed             This Visit's Progress    DIET - EAT MORE FRUITS AND VEGETABLES   On track    DIET - INCREASE WATER INTAKE   Not on track    Recommend to drink at least 6-8 8oz glasses of water per day.     COMPLETED: Mental health support       Interventions Today    Flowsheet Row Most Recent Value  Chronic Disease   Chronic disease during today's visit Other  [depression]  General Interventions   General Interventions Discussed/Reviewed General Interventions Reviewed, Community Resources  Mental Health Interventions   Mental Health Discussed/Reviewed Mental Health Reviewed, Coping Strategies, Depression  [emotional support continues to be provided related to conflicts with spouse and being potentially scammed-discussed contacting the police depts. financial crimes division or considering a representative payee-counseling discussed, however declined]  Safety Interventions   Safety Discussed/Reviewed Safety Discussed             Depression Screen    12/05/2022  1:18 PM 11/21/2022   11:10 AM 10/12/2022   10:46 AM 07/14/2022    9:21 AM 05/20/2022   11:12 AM 03/17/2022    3:47 PM 12/02/2021    4:24 PM  PHQ 2/9 Scores  PHQ - 2 Score 2 3 5 5 3 4 3   PHQ- 9 Score 7 10 13 12 11 9 10     Fall Risk    12/05/2022    1:14 PM 10/12/2022   10:46 AM 07/14/2022    9:21 AM 05/20/2022   11:12 AM 03/17/2022    3:47 PM  Fall Risk   Falls in the past year? 0 0 0 1  0  Number falls in past yr: 0 0 0 1 0  Injury with Fall? 0 0 0 0 0  Risk for fall due to : No Fall Risks No Fall Risks   No Fall Risks  Follow up Education provided;Falls prevention discussed Falls evaluation completed   Falls evaluation completed    MEDICARE RISK AT HOME:  Medicare Risk at Home - 12/05/22 1314     Any stairs in or around the home? Yes    If so, are there any without handrails? Yes    Home free of loose throw rugs in walkways, pet beds, electrical cords, etc? Yes    Adequate lighting in your home to reduce risk of falls? Yes    Life alert? No    Use of a cane, walker or w/c? No    Grab bars in the bathroom? Yes    Shower chair or bench in shower? No    Elevated toilet seat or a handicapped toilet? No             TIMED UP AND GO:  Was the test performed?  No    Cognitive Function:        12/05/2022    1:30 PM 12/02/2021    1:52 PM  6CIT Screen  What Year? 0 points 0 points  What month? 0 points 0 points  What time? 0 points 0 points  Count back from 20 0 points 0 points  Months in reverse 4 points 0 points  Repeat phrase 0 points 0 points  Total Score 4 points 0 points    Immunizations Immunization History  Administered Date(s) Administered   Fluad Quad(high Dose 65+) 01/31/2019, 02/20/2020, 05/26/2021   Influenza,inj,Quad PF,6+ Mos 03/02/2017, 06/08/2018   PFIZER(Purple Top)SARS-COV-2 Vaccination 07/05/2019, 07/26/2019, 03/31/2020   PNEUMOCOCCAL CONJUGATE-20 05/26/2021    TDAP status: Up to date  Flu Vaccine status: Up to date  Pneumococcal vaccine status: Up to date  Covid-19 vaccine status: Completed vaccines  Qualifies for Shingles Vaccine? Yes   Zostavax completed No   Shingrix Completed?: No.    Education has been provided regarding the importance of this vaccine. Patient has been advised to call insurance company to determine out of pocket expense if they have not yet received this vaccine. Advised may also receive vaccine at local  pharmacy or Health Dept. Verbalized acceptance and understanding.  Screening Tests Health Maintenance  Topic Date Due   DTaP/Tdap/Td (1 - Tdap) Never done   Zoster Vaccines- Shingrix (1 of 2) Never done   DEXA SCAN  Never done   COVID-19 Vaccine (4 - 2023-24 season) 12/31/2021   Colonoscopy  10/29/2022   INFLUENZA VACCINE  12/01/2022   Medicare Annual Wellness (AWV)  12/05/2023   MAMMOGRAM  03/07/2024   Pneumonia Vaccine 41+ Years old  Completed   Hepatitis C Screening  Completed   HPV VACCINES  Aged Out    Health Maintenance  Health Maintenance Due  Topic Date Due   DTaP/Tdap/Td (1 - Tdap) Never done   Zoster Vaccines- Shingrix (1 of 2) Never done   DEXA SCAN  Never done   COVID-19 Vaccine (4 - 2023-24 season) 12/31/2021   Colonoscopy  10/29/2022   INFLUENZA VACCINE  12/01/2022    Colorectal cancer screening: Referral to GI placed yes. Pt aware the office will call re: appt.  Mammogram status: Ordered yes. Pt provided with contact info and advised to call to schedule appt.   Bone Density status: Ordered yes. Pt provided with contact info and advised to call to schedule appt.  Lung Cancer Screening: (Low Dose CT Chest recommended if Age 39-80 years, 20 pack-year currently smoking OR have quit w/in 15years.) does not qualify.   Lung Cancer Screening Referral: no  Additional Screening:  Hepatitis C Screening: does not qualify; Completed yes  Vision Screening: Recommended annual ophthalmology exams for early detection of glaucoma and other disorders of the eye. Is the patient up to date with their annual eye exam?  Yes  Who is the provider or what is the name of the office in which the patient attends annual eye exams? [facility] If pt is not established with a provider, would they like to be referred to a provider to establish care? No .   Dental Screening: Recommended annual dental exams for proper oral hygiene  Diabetic Foot Exam: n/a  Community Resource  Referral / Chronic Care Management: CRR required this visit?  No   CCM required this visit?  No     Plan:     I have personally reviewed and noted the following in the patient's chart:   Medical and social history Use of alcohol, tobacco or illicit drugs  Current medications and supplements including opioid prescriptions. Patient is not currently taking opioid prescriptions. Functional ability and status Nutritional status Physical activity Advanced directives List of other physicians Hospitalizations, surgeries, and ER visits in previous 12 months Vitals Screenings to include cognitive, depression, and falls Referrals and appointments  In addition, I have reviewed and discussed with patient certain preventive protocols, quality metrics, and best practice recommendations. A written personalized care plan for preventive services as well as general preventive health recommendations were provided to patient.     Denise Lush, Denise Watts   05/07/1094   After Visit Summary: (MyChart) Due to this being a telephonic visit, the after visit summary with patients personalized plan was offered to patient via MyChart   Nurse Notes: The patient states she is in pain as she turned over last night and heard something pop. She relays she is having rt rib cage pain that intermittently shoots to her back. Pt denies any SOB or any other symptoms. I made pt an appt to see PCP first available tomorrow. Pt instructed to go to ER if any SOB or pain worsens and limits her mobility. Before MD visit scheduled.

## 2022-12-06 ENCOUNTER — Ambulatory Visit: Payer: Medicare HMO | Admitting: Family Medicine

## 2022-12-06 ENCOUNTER — Other Ambulatory Visit: Payer: Self-pay

## 2022-12-06 ENCOUNTER — Emergency Department: Payer: Medicare HMO

## 2022-12-06 ENCOUNTER — Emergency Department
Admission: EM | Admit: 2022-12-06 | Discharge: 2022-12-06 | Disposition: A | Discharge status: Home / Self Care | Payer: Medicare HMO | Attending: Student in an Organized Health Care Education/Training Program | Admitting: Student in an Organized Health Care Education/Training Program

## 2022-12-06 VITALS — BP 117/75 | HR 76 | Ht 61.0 in | Wt 136.6 lb

## 2022-12-06 DIAGNOSIS — R1011 Right upper quadrant pain: Secondary | ICD-10-CM | POA: Diagnosis not present

## 2022-12-06 DIAGNOSIS — S299XXA Unspecified injury of thorax, initial encounter: Secondary | ICD-10-CM | POA: Diagnosis present

## 2022-12-06 DIAGNOSIS — I2510 Atherosclerotic heart disease of native coronary artery without angina pectoris: Secondary | ICD-10-CM | POA: Diagnosis not present

## 2022-12-06 DIAGNOSIS — R0602 Shortness of breath: Secondary | ICD-10-CM

## 2022-12-06 DIAGNOSIS — R52 Pain, unspecified: Secondary | ICD-10-CM

## 2022-12-06 DIAGNOSIS — Z853 Personal history of malignant neoplasm of breast: Secondary | ICD-10-CM | POA: Diagnosis not present

## 2022-12-06 DIAGNOSIS — Z20822 Contact with and (suspected) exposure to covid-19: Secondary | ICD-10-CM | POA: Diagnosis not present

## 2022-12-06 DIAGNOSIS — R911 Solitary pulmonary nodule: Secondary | ICD-10-CM | POA: Diagnosis not present

## 2022-12-06 DIAGNOSIS — X58XXXA Exposure to other specified factors, initial encounter: Secondary | ICD-10-CM | POA: Diagnosis not present

## 2022-12-06 DIAGNOSIS — Z85828 Personal history of other malignant neoplasm of skin: Secondary | ICD-10-CM | POA: Diagnosis not present

## 2022-12-06 DIAGNOSIS — S2231XA Fracture of one rib, right side, initial encounter for closed fracture: Secondary | ICD-10-CM | POA: Diagnosis not present

## 2022-12-06 DIAGNOSIS — M8448XA Pathological fracture, other site, initial encounter for fracture: Secondary | ICD-10-CM

## 2022-12-06 DIAGNOSIS — Z85819 Personal history of malignant neoplasm of unspecified site of lip, oral cavity, and pharynx: Secondary | ICD-10-CM | POA: Insufficient documentation

## 2022-12-06 DIAGNOSIS — R0789 Other chest pain: Secondary | ICD-10-CM | POA: Diagnosis not present

## 2022-12-06 DIAGNOSIS — R918 Other nonspecific abnormal finding of lung field: Secondary | ICD-10-CM

## 2022-12-06 DIAGNOSIS — I25118 Atherosclerotic heart disease of native coronary artery with other forms of angina pectoris: Secondary | ICD-10-CM | POA: Diagnosis not present

## 2022-12-06 DIAGNOSIS — I700 Atherosclerosis of aorta: Secondary | ICD-10-CM | POA: Diagnosis not present

## 2022-12-06 LAB — URINALYSIS, COMPLETE (UACMP) WITH MICROSCOPIC
Bacteria, UA: NONE SEEN
Bilirubin Urine: NEGATIVE
Glucose, UA: NEGATIVE mg/dL
Hgb urine dipstick: NEGATIVE
Ketones, ur: NEGATIVE mg/dL
Leukocytes,Ua: NEGATIVE
Nitrite: NEGATIVE
Protein, ur: NEGATIVE mg/dL
Specific Gravity, Urine: 1.005 (ref 1.005–1.030)
Squamous Epithelial / HPF: NONE SEEN /HPF (ref 0–5)
pH: 7 (ref 5.0–8.0)

## 2022-12-06 LAB — COMPREHENSIVE METABOLIC PANEL
ALT: 16 U/L (ref 0–44)
AST: 24 U/L (ref 15–41)
Albumin: 4 g/dL (ref 3.5–5.0)
Alkaline Phosphatase: 63 U/L (ref 38–126)
Anion gap: 9 (ref 5–15)
BUN: 14 mg/dL (ref 8–23)
CO2: 26 mmol/L (ref 22–32)
Calcium: 9.3 mg/dL (ref 8.9–10.3)
Chloride: 104 mmol/L (ref 98–111)
Creatinine, Ser: 0.7 mg/dL (ref 0.44–1.00)
GFR, Estimated: >60 mL/min (ref >60)
Glucose, Bld: 97 mg/dL (ref 70–99)
Potassium: 4 mmol/L (ref 3.5–5.1)
Sodium: 139 mmol/L (ref 135–145)
Total Bilirubin: 0.5 mg/dL (ref 0.3–1.2)
Total Protein: 6.5 g/dL (ref 6.5–8.1)

## 2022-12-06 LAB — CBC WITH DIFFERENTIAL/PLATELET
Abs Immature Granulocytes: 0.02 K/uL (ref 0.00–0.07)
Basophils Absolute: 0 K/uL (ref 0.0–0.1)
Basophils Relative: 0 %
Eosinophils Absolute: 0.2 K/uL (ref 0.0–0.5)
Eosinophils Relative: 4 %
HCT: 41.6 % (ref 36.0–46.0)
Hemoglobin: 14 g/dL (ref 12.0–15.0)
Immature Granulocytes: 0 %
Lymphocytes Relative: 24 %
Lymphs Abs: 1.3 K/uL (ref 0.7–4.0)
MCH: 32.6 pg (ref 26.0–34.0)
MCHC: 33.7 g/dL (ref 30.0–36.0)
MCV: 96.7 fL (ref 80.0–100.0)
Monocytes Absolute: 0.5 K/uL (ref 0.1–1.0)
Monocytes Relative: 10 %
Neutro Abs: 3.4 K/uL (ref 1.7–7.7)
Neutrophils Relative %: 62 %
Platelets: 165 K/uL (ref 150–400)
RBC: 4.3 MIL/uL (ref 3.87–5.11)
RDW: 12.2 % (ref 11.5–15.5)
WBC: 5.5 K/uL (ref 4.0–10.5)
nRBC: 0 % (ref 0.0–0.2)

## 2022-12-06 LAB — SARS CORONAVIRUS 2 BY RT PCR: SARS Coronavirus 2 by RT PCR: NEGATIVE

## 2022-12-06 LAB — TROPONIN I (HIGH SENSITIVITY): Troponin I (High Sensitivity): 3 ng/L

## 2022-12-06 LAB — LIPASE, BLOOD: Lipase: 26 U/L (ref 11–51)

## 2022-12-06 MED ORDER — HYDROCODONE-ACETAMINOPHEN 5-325 MG PO TABS
1.0000 | ORAL_TABLET | Freq: Once | ORAL | Status: AC
  Administered 2022-12-06: 1 via ORAL
  Filled 2022-12-06: qty 1

## 2022-12-06 MED ORDER — IOHEXOL 350 MG/ML SOLN
75.0000 mL | Freq: Once | INTRAVENOUS | Status: AC | PRN
  Administered 2022-12-06: 75 mL via INTRAVENOUS

## 2022-12-06 MED ORDER — OXYCODONE-ACETAMINOPHEN 5-325 MG PO TABS: 1.0000 | ORAL_TABLET | Freq: Four times a day (QID) | ORAL | 0 refills | Status: AC | PRN

## 2022-12-06 MED ORDER — MORPHINE SULFATE (PF) 4 MG/ML IV SOLN
4.0000 mg | Freq: Once | INTRAVENOUS | Status: AC
  Administered 2022-12-06: 4 mg via INTRAVENOUS
  Filled 2022-12-06: qty 1

## 2022-12-06 MED ORDER — LIDOCAINE 5 % EX PTCH: 1.0000 | MEDICATED_PATCH | Freq: Two times a day (BID) | CUTANEOUS | 0 refills | Status: AC | PRN

## 2022-12-06 MED ORDER — LIDOCAINE 5 % EX PTCH
1.0000 | MEDICATED_PATCH | Freq: Once | CUTANEOUS | Status: DC
  Administered 2022-12-06: 1 via TRANSDERMAL
  Filled 2022-12-06: qty 1

## 2022-12-06 NOTE — Assessment & Plan Note (Signed)
Chronic, recommend ED given c/f RLL pleurisy pain

## 2022-12-06 NOTE — Assessment & Plan Note (Signed)
Acute on chronic, however, SpO2 stable Reports not having to need her O2 at home Recommend ED given symptoms and hx

## 2022-12-06 NOTE — Assessment & Plan Note (Signed)
Pt unable to control RLL/RUQ pain with OTC medication Unable to lay flat for CT Recommend ED

## 2022-12-06 NOTE — ED Provider Notes (Signed)
[facility] Emergency Department Provider Note     Event Date/Time   First MD Initiated Contact with Patient 12/06/22 1735     (approximate)   History   Back Pain   HPI  Denise Watts is a 67 y.o. female with a history of CAD, GERD, anxiety, depression, skin cancer, breast cancer, and oral cancer, presents to the ED for evaluation of right-sided rib and chest pain.  Patient presents to the ED at the advice of her primary provider, after she presented to the office today for evaluation of 5 days of persistent right-sided chest and rib pain.  Patient would also endorse an intermittently productive cough that aggravates the rib pain.  She been unable to tolerate p.o. medications noting limited benefit.  No relief of the cough with previous prescription of Tessalon Perles.  Patient also notes pain is aggravated by movement. Patient described onset of her pain after what she describes was a muscle pop in the same region a few days prior.  She also presents with concern for possible UTI.  She denies any frank fever, nausea, vomiting, injury, trauma, or fall.   Physical Exam   Triage Vital Signs: ED Triage Vitals  Encounter Vitals Group     BP 12/06/22 1452 108/86     Systolic BP Percentile --      Diastolic BP Percentile --      Pulse Rate 12/06/22 1452 76     Resp 12/06/22 1452 18     Temp 12/06/22 1452 98.2 F (36.8 C)     Temp Source 12/06/22 1452 Oral     SpO2 12/06/22 1452 97 %     Weight 12/06/22 1459 138 lb (62.6 kg)     Height 12/06/22 1459 5\' 1"  (1.549 m)     Head Circumference --      Peak Flow --      Pain Score 12/06/22 1459 9     Pain Loc --      Pain Education --      Exclude from Growth Chart --     Most recent vital signs: Vitals:   12/06/22 1452 12/06/22 1958  BP: 108/86 (!) 149/75  Pulse: 76 70  Resp: 18 17  Temp: 98.2 F (36.8 C) 97.8 F (36.6 C)  SpO2: 97% 95%    General Awake, no distress. NAD HEENT NCAT. PERRL. EOMI.  No rhinorrhea. Mucous membranes are moist.  CV:  Good peripheral perfusion. RRR RESP:  Normal effort. CTA.  Exquisite tenderness to palpation to the right mid axillary chest wall. ABD:  No distention. Soft, nontender SKIN:  No rash or skin changes noted to the chest wall   ED Results / Procedures / Treatments   Labs (all labs ordered are listed, but only abnormal results are displayed) Labs Reviewed  URINALYSIS, COMPLETE (UACMP) WITH MICROSCOPIC - Abnormal; Notable for the following components:      Result Value   Color, Urine STRAW (*)    APPearance CLEAR (*)    All other components within normal limits  SARS CORONAVIRUS 2 BY RT PCR  COMPREHENSIVE METABOLIC PANEL  LIPASE, BLOOD  CBC WITH DIFFERENTIAL/PLATELET  TROPONIN I (HIGH SENSITIVITY)  TROPONIN I (HIGH SENSITIVITY)   EKG  RADIOLOGY  CT Angio Chest Pulmonary Embolism (PE) W or WO Contrast  Result Date: 12/06/2022 CLINICAL DATA:  Chest wall pain, nontraumatic, malignancy known or suspected, xray done Patient with history of breast cancer. EXAM: CT ANGIOGRAPHY CHEST WITH CONTRAST TECHNIQUE:  Multidetector CT imaging of the chest was performed using the standard protocol during bolus administration of intravenous contrast. Multiplanar CT image reconstructions and MIPs were obtained to evaluate the vascular anatomy. RADIATION DOSE REDUCTION: This exam was performed according to the departmental dose-optimization program which includes automated exposure control, adjustment of the mA and/or kV according to patient size and/or use of iterative reconstruction technique. CONTRAST:  75mL OMNIPAQUE IOHEXOL 350 MG/ML SOLN COMPARISON:  Rib radiographs earlier today. Chest CT 05/13/2022, prior choose C2 Zhou viewed. FINDINGS: Cardiovascular: There are no filling defects within the pulmonary arteries to suggest pulmonary embolus. Moderate aortic atherosclerosis. No evidence of acute aortic injury, contrast not tailored for aortic assessment.  Stable heart size. No pericardial effusion. Mediastinum/Nodes: No mediastinal or hilar adenopathy. 7 mm left and 8 mm right hilar nodes are not enlarged by size criteria. Patulous esophagus. Lungs/Pleura: Stable 4 mm left apical nodule series 5, image 20. Left upper lobe nodule measuring 15 x 13 mm series 5, image 52, increasing in size and density from prior exam, previously 9 x 10 mm. Lobulated subpleural left lower lobe nodule measures 12 x 9 mm series 5, image 48, unchanged. Medial left lower lobe nodular density has slightly increased in size in density, 8 x 10 mm series 5, image 62. There are multiple additional tiny pulmonary nodules that are all new. For example subpleural right lower lobe nodule series 5, image 75, punctate right upper lobe series 5, image 48, and left upper lobe series 5, image 41. Vague areas of ground-glass in the perifissural right upper lobe series 5, image 66, similar to prior. Minimal apical predominant emphysema. No pleural fluid or pneumothorax. Upper Abdomen: Stable exophytic lesion from the upper left kidney, Hounsfield units of 23, likely a complex cyst. No specific imaging follow-up is needed. No acute upper abdominal findings. Musculoskeletal: There is a lucency within the right posterior ninth rib that is slightly linear in configuration, rib fracture versus focal lesion. The possibility of pathologic fracture is raised. No additional focal bone lesions are seen. Sequela of postsurgical change in the right breast. Review of the MIP images confirms the above findings. IMPRESSION: 1. No pulmonary embolus. 2. Multiple bilateral pulmonary nodules. The largest nodule in the left upper lobe has increased in size and density from prior exam, now measuring 15 x 13 mm. There are multiple additional tiny pulmonary nodules that are all new. Findings are suspicious for metastatic disease. 3. Linear lucency within the right posterior ninth rib, rib fracture versus focal lesion. The  possibility of pathologic fracture is raised. 4. Emphysema. Aortic Atherosclerosis (ICD10-I70.0) and Emphysema (ICD10-J43.9). Electronically Signed   By: Narda Rutherford M.D.   On: 12/06/2022 21:13   DG Ribs Unilateral W/Chest Right  Result Date: 12/06/2022 CLINICAL DATA:  Right chest wall pain EXAM: RIGHT RIBS AND CHEST - 3+ VIEW COMPARISON:  CT chest dated 05/13/2022. Chest radiograph dated 11/17/2021. FINDINGS: Two dominant left lung nodules, similar to the prior chest radiograph, but warranting follow-up CT when correlating with prior CT report. Right lung is essentially clear. No pleural effusion or pneumothorax. Heart is normal in size.  Mild thoracic aortic atherosclerosis. No displaced right rib fracture is seen. Surgical clips in the right chest wall/breast. IMPRESSION: No displaced right rib fracture is seen. Two dominant left lung nodules, grossly unchanged. However, repeat CT chest is suggested for further evaluation. Electronically Signed   By: Charline Bills M.D.   On: 12/06/2022 19:10     PROCEDURES:  Critical Care performed:  No  Procedures   MEDICATIONS ORDERED IN ED: Medications  morphine (PF) 4 MG/ML injection 4 mg (has no administration in time range)  lidocaine (LIDODERM) 5 % 1 patch (has no administration in time range)  HYDROcodone-acetaminophen (NORCO/VICODIN) 5-325 MG per tablet 1 tablet (1 tablet Oral Given 12/06/22 1905)  iohexol (OMNIPAQUE) 350 MG/ML injection 75 mL (75 mLs Intravenous Contrast Given 12/06/22 2046)     IMPRESSION / MDM / ASSESSMENT AND PLAN / ED COURSE  I reviewed the triage vital signs and the nursing notes.                              Differential diagnosis includes, but is not limited to, muscle strain, myalgia, UTI, pyelonephritis or kidney stones, ACS, aortic dissection, pulmonary embolism, cardiac tamponade, pneumothorax, pneumonia, pericarditis, myocarditis, GI-related causes including esophagitis/gastritis, and musculoskeletal chest wall  pain.    Patient's presentation is most consistent with acute complicated illness / injury requiring diagnostic workup.  Patient's diagnosis is consistent with acute right rib pain, secondary to possible pathologic fracture.  She was evaluated for complaints in the ED, and found to have overall reassuring workup at this time.  No acute respiratory distress on exam.  Patient however is experiencing tenderness to palpation and exquisite chest wall pain on the right.  Labs are reassuring at this shows no acute leukocytosis, critical anemia, or electrolyte abnormalities.  Troponin is normal and COVID PCR is negative.  UA without evidence of acute leukocytosis.  Patient x-ray negative for any acute intrathoracic process, but considering patient's pain and evidence of persistent lung nodules, CTA is performed.  CTA reveals what appears to be a left ninth rib fracture possibly pathologic in nature.  It also shows stable and persistent bilateral lung nodules but no evidence of pulmonary emboli.  There are also new lesions concerning for possible metastatic disease.  Given the patient's acute pain presentation, she is given prescription for Percocet and Lidoderm patches.  She is also advised to follow-up with her primary oncologist for further evaluation management of her current CT findings. Patient is given ED precautions to return to the ED for any worsening or new symptoms.  FINAL CLINICAL IMPRESSION(S) / ED DIAGNOSES   Final diagnoses:  Closed fracture of one rib of right side, initial encounter  Pathologic rib fracture, initial encounter  Pulmonary nodules/lesions, multiple     Rx / DC Orders   ED Discharge Orders          Ordered    oxyCODONE-acetaminophen (PERCOCET) 5-325 MG tablet  Every 6 hours PRN        12/06/22 2238    lidocaine (LIDODERM) 5 %  Every 12 hours PRN        12/06/22 2238             Note:  This document was prepared using Dragon voice recognition software and may  include unintentional dictation errors.    Lissa Hoard, PA-C 12/07/22 0010    Willy Eddy, MD 12/08/22 1100

## 2022-12-06 NOTE — ED Notes (Signed)
Pt assisted to ambulate to the bathroom, pt ambulated with steady and gait returned to bed. Pt denies other needs at this time

## 2022-12-06 NOTE — Discharge Instructions (Addendum)
Your exam, x-ray, labs, and CT scan are overall reassuring.  You do have evidence of a single ninth rib fracture on the CT scan.  You also have evidence of progression of your previously known pulmonary nodules.  You should follow-up with your PCP as well as your oncologist for further evaluation and management of these nodules.  Take the pain medicine as prescribed and use the Lidoderm patches as directed.

## 2022-12-06 NOTE — Assessment & Plan Note (Signed)
Acute on Chronic, atypical chest pain Recommend ED given hx of CAD and acute pain with SOB/DOE

## 2022-12-06 NOTE — Assessment & Plan Note (Signed)
5 days of pain; worse in last 1-2 days Recommend imaging at ER

## 2022-12-06 NOTE — Progress Notes (Signed)
Established patient visit   Patient: Denise Watts   DOB: 1956/01/20   67 y.o. Female  MRN: [id number] Visit Date: 12/06/2022  Today's healthcare provider: Jacky Kindle, FNP  Introduced to nurse practitioner role and practice setting.  All questions answered.  Discussed provider/patient relationship and expectations.  Subjective    HPI HPI     Pain    Additional comments: Patient is being seen due to pain shooting through back from right rib for about 4 days now and is getting worse. Patient reports pain is 8/10 that is constant. Reports it is worse when changing positions. She has not taken anything for her pain.       Last edited by Acey Lav, CMA on 12/06/2022  1:54 PM.      Medications: Outpatient Medications Prior to Visit  Medication Sig   albuterol (PROVENTIL) (2.5 MG/3ML) 0.083% nebulizer solution Take 3 mLs (2.5 mg total) by nebulization every 6 (six) hours as needed for wheezing or shortness of breath.   albuterol (VENTOLIN HFA) 108 (90 Base) MCG/ACT inhaler Inhale 2 puffs into the lungs every 6 (six) hours as needed for shortness of breath.   alum & mag hydroxide-simeth (MAALOX PLUS) 400-400-40 MG/5ML suspension Take 15 mLs by mouth every 6 (six) hours as needed for indigestion.   aspirin EC 81 MG tablet Take 1 tablet (81 mg total) by mouth daily. Swallow whole.   Budeson-Glycopyrrol-Formoterol (BREZTRI AEROSPHERE) 160-9-4.8 MCG/ACT AERO Inhale 2 puffs into the lungs in the morning and at bedtime.   buPROPion ER (WELLBUTRIN SR) 100 MG 12 hr tablet Take 1 tablet (100 mg total) by mouth 2 (two) times daily.   busPIRone (BUSPAR) 5 MG tablet Take 1 tablet (5 mg total) by mouth 3 (three) times daily.   ciprofloxacin (CIPRO) 250 MG tablet Take 1 tablet (250 mg total) by mouth 2 (two) times daily.   cyclobenzaprine (FLEXERIL) 5 MG tablet TAKE 1 TABLET BY MOUTH AT BEDTIME   DULoxetine (CYMBALTA) 60 MG capsule Take 1 capsule (60 mg total) by mouth 2 (two) times daily.    fluticasone (FLONASE) 50 MCG/ACT nasal spray Place 1 spray into both nostrils at bedtime.   gabapentin (NEURONTIN) 300 MG capsule TAKE 1 CAPSULE BY MOUTH 3 TIMES DAILY   meloxicam (MOBIC) 7.5 MG tablet TAKE 1 TABLET BY MOUTH DAILY   mirabegron ER (MYRBETRIQ) 25 MG TB24 tablet Take 25 mg by mouth. 2 tablets daily   montelukast (SINGULAIR) 10 MG tablet Take 1 tablet (10 mg total) by mouth at bedtime.   omeprazole (PRILOSEC) 40 MG capsule TAKE 1 CAPSULE BY MOUTH DAILY   rOPINIRole (REQUIP) 0.25 MG tablet TAKE 1 TABLET BY MOUTH AT BEDTIME   rosuvastatin (CRESTOR) 40 MG tablet Take 1 tablet (40 mg total) by mouth daily.   Facility-Administered Medications Prior to Visit  Medication Dose Route Frequency Provider   0.9 %  sodium chloride infusion   Intravenous Once PRN Creig Hines, MD   albuterol (PROVENTIL) (2.5 MG/3ML) 0.083% nebulizer solution 2.5 mg  2.5 mg Nebulization Once PRN Creig Hines, MD   cyanocobalamin ((VITAMIN B-12)) injection 1,000 mcg  1,000 mcg Intramuscular Q30 days Creig Hines, MD   cyanocobalamin (VITAMIN B12) injection 1,000 mcg  1,000 mcg Intramuscular Q30 days Creig Hines, MD    Review of Systems    Objective    BP 117/75 (BP Location: Left Arm, Patient Position: Sitting, Cuff Size: Normal)   Pulse 76   Ht 5\' 1"  (  1.549 m)   Wt 136 lb 9.6 oz (62 kg)   SpO2 100%   BMI 25.81 kg/m   Physical Exam Vitals and nursing note reviewed.  Constitutional:      General: She is not in acute distress.    Appearance: Normal appearance. She is overweight. She is ill-appearing and diaphoretic. She is not toxic-appearing.  HENT:     Head: Normocephalic and atraumatic.  Cardiovascular:     Rate and Rhythm: Normal rate and regular rhythm.     Pulses: Normal pulses.     Heart sounds: Normal heart sounds. No murmur heard.    No friction rub. No gallop.  Pulmonary:     Effort: Pulmonary effort is normal. No respiratory distress.     Breath sounds: Normal breath sounds.  No stridor. No wheezing, rhonchi or rales.  Chest:     Chest wall: No tenderness.  Abdominal:     General: Bowel sounds are normal.     Palpations: Abdomen is soft.     Tenderness: There is abdominal tenderness. There is right CVA tenderness and guarding.  Musculoskeletal:        General: No swelling, tenderness, deformity or signs of injury. Normal range of motion.     Right lower leg: No edema.     Left lower leg: No edema.  Skin:    General: Skin is warm.     Capillary Refill: Capillary refill takes less than 2 seconds.     Coloration: Skin is not jaundiced or pale.     Findings: No bruising, erythema, lesion or rash.  Neurological:     General: No focal deficit present.     Mental Status: She is alert and oriented to person, place, and time. Mental status is at baseline.     Cranial Nerves: No cranial nerve deficit.     Sensory: No sensory deficit.     Motor: No weakness.     Coordination: Coordination normal.  Psychiatric:        Mood and Affect: Mood normal.        Behavior: Behavior normal.        Thought Content: Thought content normal.        Judgment: Judgment normal.     No results found for any visits on 12/06/22.  Assessment & Plan     Problem List Items Addressed This Visit       Cardiovascular and Mediastinum   Coronary artery disease of native artery of native heart with stable angina pectoris (HCC)    Acute on Chronic, atypical chest pain Recommend ED given hx of CAD and acute pain with SOB/DOE        Respiratory   Lung nodules    Chronic, recommend ED given c/f RLL pleurisy pain         Other   Continuous RUQ abdominal pain    5 days of pain; worse in last 1-2 days Recommend imaging at ER      SOB (shortness of breath)    Acute on chronic, however, SpO2 stable Reports not having to need her O2 at home Recommend ED given symptoms and hx       Uncontrolled pain - Primary    Pt unable to control RLL/RUQ pain with OTC medication Unable to  lay flat for CT Recommend ED      Seek emergent care; declines transfer via ambulance. Reports spouse can drive.    Leilani Merl, FNP, have reviewed all documentation for  this visit. The documentation on 12/06/22 for the exam, diagnosis, procedures, and orders are all accurate and complete.  Jacky Kindle, FNP  [facility] [phone] (phone) [phone] (fax)  [facility]

## 2022-12-06 NOTE — ED Triage Notes (Signed)
Pt c/o right side and back pain where she felt a muscle pop a few days ago. Pt is also concerned with a UTI

## 2022-12-07 ENCOUNTER — Inpatient Hospital Stay: Payer: Medicare HMO | Attending: Oncology | Admitting: Oncology

## 2022-12-07 ENCOUNTER — Encounter: Payer: Self-pay | Admitting: Oncology

## 2022-12-07 VITALS — BP 99/58 | HR 91 | Temp 99.0°F | Resp 18 | Ht 61.0 in | Wt 137.1 lb

## 2022-12-07 DIAGNOSIS — Z9221 Personal history of antineoplastic chemotherapy: Secondary | ICD-10-CM | POA: Diagnosis not present

## 2022-12-07 DIAGNOSIS — Z171 Estrogen receptor negative status [ER-]: Secondary | ICD-10-CM | POA: Diagnosis not present

## 2022-12-07 DIAGNOSIS — Z853 Personal history of malignant neoplasm of breast: Secondary | ICD-10-CM | POA: Diagnosis not present

## 2022-12-07 DIAGNOSIS — Z87891 Personal history of nicotine dependence: Secondary | ICD-10-CM | POA: Diagnosis not present

## 2022-12-07 DIAGNOSIS — C50511 Malignant neoplasm of lower-outer quadrant of right female breast: Secondary | ICD-10-CM

## 2022-12-07 DIAGNOSIS — Z8521 Personal history of malignant neoplasm of larynx: Secondary | ICD-10-CM | POA: Insufficient documentation

## 2022-12-07 DIAGNOSIS — Z923 Personal history of irradiation: Secondary | ICD-10-CM | POA: Insufficient documentation

## 2022-12-07 DIAGNOSIS — R918 Other nonspecific abnormal finding of lung field: Secondary | ICD-10-CM | POA: Diagnosis not present

## 2022-12-07 DIAGNOSIS — D519 Vitamin B12 deficiency anemia, unspecified: Secondary | ICD-10-CM | POA: Diagnosis not present

## 2022-12-07 DIAGNOSIS — Z808 Family history of malignant neoplasm of other organs or systems: Secondary | ICD-10-CM | POA: Insufficient documentation

## 2022-12-08 ENCOUNTER — Other Ambulatory Visit: Payer: Self-pay | Admitting: Family Medicine

## 2022-12-08 ENCOUNTER — Encounter: Payer: Self-pay | Admitting: Oncology

## 2022-12-08 NOTE — Progress Notes (Signed)
Hematology/Oncology Consult note [facility]  Telephone:([phone] [phone]  Patient Care Team: Jacky Kindle, FNP as PCP - General (Family Medicine) Debbe Odea, MD as PCP - Cardiology (Cardiology) Creig Hines, MD as Consulting Physician (Oncology) Leafy Ro, MD as Consulting Physician (General Surgery) Dasher, Cliffton Asters, MD (Dermatology) Scheeler, Kermit Balo, PA-C as Physician Assistant (Plastic Surgery) Minna Antis, MD as Attending Physician (Emergency Medicine) Alvira Philips Leitha Bleak (Neurology) Dayna Barker, MD as Referring Physician (Internal Medicine) Coralyn Helling, MD as Consulting Physician (Pulmonary Disease) Gaspar Cola, RPH (Inactive) (Pharmacist) Pa, [facility])   Name of the patient: Mayraalejandra Plazola  [id number]  Feb 13, 1956   Date of visit: 12/08/22  Diagnosis- 1. H/o breast cancer in 1997 2. H/o stage I SCC of vocal cord in 2015. 3. Lung nodules 4. Adrenal carcinoma of unknown primary s/p resection 5. Stage IaT1a NxcM0 ER weakly positive 1-10%, PR negative and her 2 negative     Chief complaint/ Reason for visit-discuss CT scan results and further management  Heme/Onc history:  Oncology History Overview Note  1. Carcinoma of breast, T1, N0, M0 tumor diagnosis.  In January of 1997 2. Carcinoma of the vocal cord T1, N0, M0 tumor.  Status postradiation therapy 3. Abnormal CT scan of the chest (December, 2015) 4. Repeat CT scan of chest shows stable nodule (July, 2016) 5. Neutropenia with normal hemoglobin and normal platelet count (July, 2016)   Cancer of vocal cord [facility]) (Resolved)  11/10/2014 Initial Diagnosis   Cancer of vocal cord   Malignant neoplasm of lower-outer quadrant of right breast of female, estrogen receptor negative (HCC)  11/24/2017 Initial Diagnosis   Malignant neoplasm of lower-outer quadrant of right breast of female, estrogen receptor negative (HCC)    01/12/2018 Cancer Staging   Staging form: Breast, AJCC 8th Edition - Clinical stage from 01/12/2018: Stage IB (cT1a, cN0, cM0, G3, ER+, PR-, HER2-) - Signed by Creig Hines, MD on 01/12/2018   01/30/2018 - 04/26/2018 Chemotherapy   The patient had dexamethasone (DECADRON) 4 MG tablet, 8 mg, Oral, 2 times daily, 1 of 1 cycle, Start date: 01/12/2018, End date: 08/03/2018 palonosetron (ALOXI) injection 0.25 mg, 0.25 mg, Intravenous,  Once, 4 of 4 cycles Administration: 0.25 mg (01/30/2018), 0.25 mg (02/27/2018), 0.25 mg (03/28/2018), 0.25 mg (04/26/2018) pegfilgrastim (NEULASTA ONPRO KIT) injection 6 mg, 6 mg, Subcutaneous, Once, 4 of 4 cycles Administration: 6 mg (01/30/2018), 6 mg (02/27/2018), 6 mg (03/28/2018), 6 mg (04/26/2018) cyclophosphamide (CYTOXAN) 1,000 mg in sodium chloride 0.9 % 250 mL chemo infusion, 600 mg/m2 = 1,000 mg, Intravenous,  Once, 4 of 4 cycles Administration: 1,000 mg (01/30/2018), 1,000 mg (02/27/2018), 1,000 mg (03/28/2018), 1,000 mg (04/26/2018) DOCEtaxel (TAXOTERE) 130 mg in sodium chloride 0.9 % 250 mL chemo infusion, 75 mg/m2 = 130 mg, Intravenous,  Once, 4 of 4 cycles Dose modification: 60 mg/m2 (original dose 75 mg/m2, Cycle 2, Reason: Dose not tolerated) Administration: 130 mg (01/30/2018), 100 mg (02/27/2018), 100 mg (03/28/2018), 100 mg (04/26/2018)  for chemotherapy treatment.     The patient was originally diagnosed with right breast cancer in 1997, 10 mm grade 3, ER pr positive her 2 negative, node negative status post surgery and radiation treatment only. She never received chemotherapy or hormone therapy. For her vocal cord cancer, she had extensive surgery and radiation therapy in 1998. Due to past history of smoking, she was also noted to have lung nodules. She is undergoing active surveillance imaging study  of the chest.   Patient noted to have adrenal mass on CT chest which prompted PET CT in July 2018 which showed: IMPRESSION: 1. Right adrenal mass with  peripheral hypermetabolism. Given interval development since 11/18/2015, suspicious for either isolated metastasis or a primary adrenal neoplasm. This should be considered for biopsy. 2. No evidence of hypermetabolic primary malignancy or extraadrenal metastasis. Pulmonary nodules are not significantly hypermetabolic. 3. Coronary artery atherosclerosis. Aortic Atherosclerosis (ICD10-I70.0). 4. Hepatic steatosis. 5. Left sixth rib hypermetabolism is favored to be related to remote Trauma.   Patient underwent right adrenalectomy which showed: DIAGNOSIS:  A. ADRENAL GLAND, RIGHT; ADRENALECTOMY:  - HIGH GRADE CARCINOMA WITH EXTENSIVE NECROSIS.  - MARGINS OF THE SPECIMEN ARE NEGATIVE FOR CARCINOMA.   Comment:  Sections demonstrate abundant necrosis confined to the adrenal medullary  compartment. In one block of tissue a small fragment of viable malignant  neoplasm was identified. A panel of immunohistochemical stains was  performed with the following pattern of immunoreactivity:  Super pancytokeratin: Positive  GATA-3: Negative  TTF-1: Negative  P40: Negative  Napsin: Negative  PAX-8: Negative  Chromogranin: Negative     Repeat CT chest abdomen and pelvis in July 2019 showed stable left lower lobe pulmonary nodule.  No other evidence of metastatic disease.  New 4.5 mm nodule in the right breast.  This was followed by ultrasound mammogram and core biopsy of the breast lesion which was a grade 3 triple negative breast cancer.  Patient was seen by Dr. Lemar Livings and underwent lumpectomy on 12/08/2017.   Patient had a 5 mm weakly ER positive tumor.  Given her ongoing fatigue Oncotype testing was done to see if she could potentially avoid chemotherapy.  Oncotype shows a recurrence score of 53 with her risk of distant recurrence at 9 years at greater than 39% with hormone therapy alone.  Absolute benefit of chemotherapy was greater than 15% in her age group.  Patient completed 4 cycles of adjuvant TC  chemotherapy in December 2019       Interval history- Patient currently reports right chest wall pain.  She was diagnosed with rib fracture involving the ninth rib posteriorly there was a possibility of pathologic fracture.  Patient was also found to have new worsening lung nodules concerning for metastatic disease.  ECOG PS- 2 Pain scale- 5   Review of systems- Review of Systems  Constitutional:  Negative for chills, fever, malaise/fatigue and weight loss.  HENT:  Negative for congestion, ear discharge and nosebleeds.   Eyes:  Negative for blurred vision.  Respiratory:  Negative for cough, hemoptysis, sputum production, shortness of breath and wheezing.   Cardiovascular:  Negative for chest pain, palpitations, orthopnea and claudication.  Gastrointestinal:  Negative for abdominal pain, blood in stool, constipation, diarrhea, heartburn, melena, nausea and vomiting.  Genitourinary:  Negative for dysuria, flank pain, frequency, hematuria and urgency.  Musculoskeletal:  Negative for back pain, joint pain and myalgias.       Right chest wall pain  Skin:  Negative for rash.  Neurological:  Negative for dizziness, tingling, focal weakness, seizures, weakness and headaches.  Endo/Heme/Allergies:  Does not bruise/bleed easily.  Psychiatric/Behavioral:  Negative for depression and suicidal ideas. The patient does not have insomnia.       Allergies  Allergen Reactions   Sulfa Antibiotics Other (See Comments)    Mouth sore and raw     Past Medical History:  Diagnosis Date   Anxiety    Arthritis    KNEE RIGHT   Breast  cancer [facility]) 1997   right breast lumpectomy with radiation   Breast cancer (HCC) 11/15/2017   INVASIVE MAMMARY CARCINOMA/ triple negative   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HCC) 11/10/2014   Cough 03/02/2017   INTERMTTENT DRY COUGH   Depression    GERD (gastroesophageal reflux disease)    History of hiatal hernia    Personal history of chemotherapy    Personal history of  radiation therapy    Skin cancer    Throat cancer (HCC) 1998   radiation   Wears dentures    full upper and lower     Past Surgical History:  Procedure Laterality Date   BREAST BIOPSY Right 1997   positive   BREAST BIOPSY Right 11/15/2017   INVASIVE MAMMARY CARCINOMA triple negative   BREAST BIOPSY Right 07/20/2020   [place] bx, Q marker, neg   BREAST LUMPECTOMY Right 1997   2019 also   BREAST LUMPECTOMY WITH SENTINEL LYMPH NODE BIOPSY Right 12/08/2017   Procedure: BREAST LUMPECTOMY WITH SENTINEL LYMPH NODE BX;  Surgeon: Earline Mayotte, MD;  Location: [facility];  Service: General;  Laterality: Right;   BREAST REDUCTION WITH MASTOPEXY Left 06/10/2019   Procedure: Left breast mastopexy reduction for symmetry;  Surgeon: Peggye Form, DO;  Location: [facility];  Service: Plastics;  Laterality: Left;  total 2.5 hours   CATARACT EXTRACTION W/PHACO Right 02/07/2022   Procedure: CATARACT EXTRACTION PHACO AND INTRAOCULAR LENS PLACEMENT (IOC) RIGHT DIABETIC;  Surgeon: Nevada Crane, MD;  Location: [facility];  Service: Ophthalmology;  Laterality: Right;  3.60 00:34.7   CATARACT EXTRACTION W/PHACO Left 02/28/2022   Procedure: CATARACT EXTRACTION PHACO AND INTRAOCULAR LENS PLACEMENT (IOC) LEFT DIABETIC 4.11 00:26.1;  Surgeon: Nevada Crane, MD;  Location: [facility];  Service: Ophthalmology;  Laterality: Left;   COLONOSCOPY  2015   COLONOSCOPY WITH PROPOFOL N/A 10/29/2019   Procedure: COLONOSCOPY WITH PROPOFOL;  Surgeon: Midge Minium, MD;  Location: [facility];  Service: Endoscopy;  Laterality: N/A;   ESOPHAGOGASTRODUODENOSCOPY (EGD) WITH PROPOFOL N/A 05/24/2019   Procedure: ESOPHAGOGASTRODUODENOSCOPY (EGD) WITH PROPOFOL;  Surgeon: Midge Minium, MD;  Location: [facility];  Service: Endoscopy;  Laterality: N/A;   ESOPHAGOGASTRODUODENOSCOPY (EGD) WITH PROPOFOL N/A 09/06/2022   Procedure: ESOPHAGOGASTRODUODENOSCOPY (EGD) WITH PROPOFOL;   Surgeon: Midge Minium, MD;  Location: [facility];  Service: Endoscopy;  Laterality: N/A;   ESOPHAGUS SURGERY     KNEE SURGERY Right    LIPOSUCTION WITH LIPOFILLING Right 06/10/2019   Procedure: right breast scar and capsule release with fat grafting;  Surgeon: Peggye Form, DO;  Location: [facility];  Service: Plastics;  Laterality: Right;   PORT-A-CATH REMOVAL     PORTACATH PLACEMENT Right 01/17/2018   Procedure: INSERTION PORT-A-CATH- RIGHT;  Surgeon: Earline Mayotte, MD;  Location: [facility];  Service: General;  Laterality: Right;   ROBOTIC ADRENALECTOMY Right 12/28/2016   Procedure: ROBOTIC ADRENALECTOMY;  Surgeon: Vanna Scotland, MD;  Location: [facility];  Service: Urology;  Laterality: Right;   SKIN CANCER EXCISION     THROAT SURGERY  1998   throat cancer    TUBAL LIGATION     VENTRAL HERNIA REPAIR N/A 03/03/2017   Procedure: HERNIA REPAIR VENTRAL ADULT;  Surgeon: Ricarda Frame, MD;  Location: [facility];  Service: General;  Laterality: N/A;    Social History   Socioeconomic History   Marital status: Married    Spouse name: Not on file   Number of children: 4   Years of education: Not  on file   Highest education level: 10th grade  Occupational History   Occupation: retired  Tobacco Use   Smoking status: Former    Current packs/day: 0.00    Average packs/day: 1.5 packs/day for 30.0 years (45.0 ttl pk-yrs)    Types: Cigarettes    Start date: 05/02/1965    Quit date: 05/03/1995    Years since quitting: 27.6    Passive exposure: Past   Smokeless tobacco: Never  Vaping Use   Vaping status: Never Used  Substance and Sexual Activity   Alcohol use: Not Currently    Alcohol/week: 4.0 standard drinks of alcohol    Types: 4 Shots of liquor per week    Comment: none   Drug use: No   Sexual activity: Not Currently    Birth control/protection: Post-menopausal  Other Topics Concern   Not on file  Social History Narrative   Not on file   Social  Determinants of Health   Financial Resource Strain: Low Risk  (12/02/2021)   Overall Financial Resource Strain (CARDIA)    Difficulty of Paying Living Expenses: Not very hard  Food Insecurity: No Food Insecurity (12/05/2022)   Hunger Vital Sign    Worried About Running Out of Food in the Last Year: Never true    Ran Out of Food in the Last Year: Never true  Transportation Needs: No Transportation Needs (12/05/2022)   PRAPARE - Administrator, Civil Service (Medical): No    Lack of Transportation (Non-Medical): No  Physical Activity: Insufficiently Active (12/05/2022)   Exercise Vital Sign    Days of Exercise per Week: 3 days    Minutes of Exercise per Session: 30 min  Stress: Stress Concern Present (12/05/2022)   [employer] of Occupational Health - Occupational Stress Questionnaire    Feeling of Stress : Rather much  Social Connections: Moderately Isolated (12/05/2022)   Social Connection and Isolation Panel [NHANES]    Frequency of Communication with Friends and Family: Three times a week    Frequency of Social Gatherings with Friends and Family: Twice a week    Attends Religious Services: Never    Database administrator or Organizations: No    Attends Banker Meetings: Never    Marital Status: Married  Catering manager Violence: Not At Risk (12/05/2022)   Humiliation, Afraid, Rape, and Kick questionnaire    Fear of Current or Ex-Partner: No    Emotionally Abused: No    Physically Abused: No    Sexually Abused: No    Family History  Problem Relation Age of Onset   Diabetes Mother    Heart attack Mother    Cancer Sister        cancer of eyes, spread to lung, other places. died at 20   Cancer Sister 22       unknown primary, spread to bone, brain died in 45's   Cancer Paternal Aunt        unk type   Cancer Paternal Grandmother        type unk   Prostate cancer Neg Hx    Kidney cancer Neg Hx    Bladder Cancer Neg Hx    Breast cancer Neg Hx       Current Outpatient Medications:    albuterol (PROVENTIL) (2.5 MG/3ML) 0.083% nebulizer solution, Take 3 mLs (2.5 mg total) by nebulization every 6 (six) hours as needed for wheezing or shortness of breath., Disp: 150 mL, Rfl: 1   albuterol (VENTOLIN  HFA) 108 (90 Base) MCG/ACT inhaler, Inhale 2 puffs into the lungs every 6 (six) hours as needed for shortness of breath., Disp: 6.7 g, Rfl: 0   alum & mag hydroxide-simeth (MAALOX PLUS) 400-400-40 MG/5ML suspension, Take 15 mLs by mouth every 6 (six) hours as needed for indigestion., Disp: 355 mL, Rfl: 0   aspirin EC 81 MG tablet, Take 1 tablet (81 mg total) by mouth daily. Swallow whole., Disp: 90 tablet, Rfl: 3   Budeson-Glycopyrrol-Formoterol (BREZTRI AEROSPHERE) 160-9-4.8 MCG/ACT AERO, Inhale 2 puffs into the lungs in the morning and at bedtime., Disp: 5.9 g, Rfl: 5   buPROPion ER (WELLBUTRIN SR) 100 MG 12 hr tablet, Take 1 tablet (100 mg total) by mouth 2 (two) times daily., Disp: 180 tablet, Rfl: 0   busPIRone (BUSPAR) 5 MG tablet, Take 1 tablet (5 mg total) by mouth 3 (three) times daily., Disp: 270 tablet, Rfl: 0   ciprofloxacin (CIPRO) 250 MG tablet, Take 1 tablet (250 mg total) by mouth 2 (two) times daily., Disp: 14 tablet, Rfl: 0   cyclobenzaprine (FLEXERIL) 5 MG tablet, TAKE 1 TABLET BY MOUTH AT BEDTIME, Disp: 30 tablet, Rfl: 0   DULoxetine (CYMBALTA) 60 MG capsule, Take 1 capsule (60 mg total) by mouth 2 (two) times daily., Disp: 180 capsule, Rfl: 0   fluticasone (FLONASE) 50 MCG/ACT nasal spray, Place 1 spray into both nostrils at bedtime., Disp: 16 g, Rfl: 6   gabapentin (NEURONTIN) 300 MG capsule, TAKE 1 CAPSULE BY MOUTH 3 TIMES DAILY, Disp: 60 capsule, Rfl: 5   lidocaine (LIDODERM) 5 %, Place 1 patch onto the skin every 12 (twelve) hours as needed for up to 10 days. Remove & Discard patch after 12 hours of wear each day., Disp: 10 patch, Rfl: 0   meloxicam (MOBIC) 7.5 MG tablet, TAKE 1 TABLET BY MOUTH DAILY, Disp: 30 tablet, Rfl:  0   mirabegron ER (MYRBETRIQ) 25 MG TB24 tablet, Take 25 mg by mouth. 2 tablets daily, Disp: , Rfl:    montelukast (SINGULAIR) 10 MG tablet, Take 1 tablet (10 mg total) by mouth at bedtime., Disp: 90 tablet, Rfl: 3   omeprazole (PRILOSEC) 40 MG capsule, TAKE 1 CAPSULE BY MOUTH DAILY, Disp: 90 capsule, Rfl: 0   oxyCODONE-acetaminophen (PERCOCET) 5-325 MG tablet, Take 1 tablet by mouth every 6 (six) hours as needed for up to 5 days for severe pain., Disp: 20 tablet, Rfl: 0   rOPINIRole (REQUIP) 0.25 MG tablet, TAKE 1 TABLET BY MOUTH AT BEDTIME, Disp: 90 tablet, Rfl: 1   rosuvastatin (CRESTOR) 40 MG tablet, Take 1 tablet (40 mg total) by mouth daily., Disp: 90 tablet, Rfl: 3 No current facility-administered medications for this visit.  Facility-Administered Medications Ordered in Other Visits:    0.9 %  sodium chloride infusion, , Intravenous, Once PRN, Creig Hines, MD   albuterol (PROVENTIL) (2.5 MG/3ML) 0.083% nebulizer solution 2.5 mg, 2.5 mg, Nebulization, Once PRN, Creig Hines, MD   cyanocobalamin ((VITAMIN B-12)) injection 1,000 mcg, 1,000 mcg, Intramuscular, Q30 days, Creig Hines, MD, 1,000 mcg at 08/27/21 1537   cyanocobalamin (VITAMIN B12) injection 1,000 mcg, 1,000 mcg, Intramuscular, Q30 days, Creig Hines, MD, 1,000 mcg at 03/16/22 1101  Physical exam:  Vitals:   12/07/22 1438  BP: (!) 99/58  Pulse: 91  Resp: 18  Temp: 99 F (37.2 C)  TempSrc: Tympanic  SpO2: 95%  Weight: 137 lb 1.6 oz (62.2 kg)  Height: 5\' 1"  (1.549 m)   Physical Exam Cardiovascular:  Rate and Rhythm: Normal rate and regular rhythm.     Heart sounds: Normal heart sounds.  Pulmonary:     Effort: Pulmonary effort is normal.     Breath sounds: Normal breath sounds.  Abdominal:     General: Bowel sounds are normal.     Palpations: Abdomen is soft.  Skin:    General: Skin is warm and dry.  Neurological:     Mental Status: She is alert and oriented to person, place, and time.          Latest Ref Rng & Units 12/06/2022    7:31 PM  CMP  Glucose 70 - 99 mg/dL 97   BUN 8 - 23 mg/dL 14   Creatinine 4.09 - 1.00 mg/dL 8.11   Sodium 914 - 782 mmol/L 139   Potassium 3.5 - 5.1 mmol/L 4.0   Chloride 98 - 111 mmol/L 104   CO2 22 - 32 mmol/L 26   Calcium 8.9 - 10.3 mg/dL 9.3   Total Protein 6.5 - 8.1 g/dL 6.5   Total Bilirubin 0.3 - 1.2 mg/dL 0.5   Alkaline Phos 38 - 126 U/L 63   AST 15 - 41 U/L 24   ALT 0 - 44 U/L 16       Latest Ref Rng & Units 12/06/2022    7:31 PM  CBC  WBC 4.0 - 10.5 K/uL 5.5   Hemoglobin 12.0 - 15.0 g/dL 95.6   Hematocrit 21.3 - 46.0 % 41.6   Platelets 150 - 400 K/uL 165     No images are attached to the encounter.  CT Angio Chest Pulmonary Embolism (PE) W or WO Contrast  Result Date: 12/06/2022 CLINICAL DATA:  Chest wall pain, nontraumatic, malignancy known or suspected, xray done Patient with history of breast cancer. EXAM: CT ANGIOGRAPHY CHEST WITH CONTRAST TECHNIQUE: Multidetector CT imaging of the chest was performed using the standard protocol during bolus administration of intravenous contrast. Multiplanar CT image reconstructions and MIPs were obtained to evaluate the vascular anatomy. RADIATION DOSE REDUCTION: This exam was performed according to the departmental dose-optimization program which includes automated exposure control, adjustment of the mA and/or kV according to patient size and/or use of iterative reconstruction technique. CONTRAST:  75mL OMNIPAQUE IOHEXOL 350 MG/ML SOLN COMPARISON:  Rib radiographs earlier today. Chest CT 05/13/2022, prior choose C2 Zhou viewed. FINDINGS: Cardiovascular: There are no filling defects within the pulmonary arteries to suggest pulmonary embolus. Moderate aortic atherosclerosis. No evidence of acute aortic injury, contrast not tailored for aortic assessment. Stable heart size. No pericardial effusion. Mediastinum/Nodes: No mediastinal or hilar adenopathy. 7 mm left and 8 mm right hilar nodes are not enlarged  by size criteria. Patulous esophagus. Lungs/Pleura: Stable 4 mm left apical nodule series 5, image 20. Left upper lobe nodule measuring 15 x 13 mm series 5, image 52, increasing in size and density from prior exam, previously 9 x 10 mm. Lobulated subpleural left lower lobe nodule measures 12 x 9 mm series 5, image 48, unchanged. Medial left lower lobe nodular density has slightly increased in size in density, 8 x 10 mm series 5, image 62. There are multiple additional tiny pulmonary nodules that are all new. For example subpleural right lower lobe nodule series 5, image 75, punctate right upper lobe series 5, image 48, and left upper lobe series 5, image 41. Vague areas of ground-glass in the perifissural right upper lobe series 5, image 66, similar to prior. Minimal apical predominant emphysema. No pleural fluid or pneumothorax.  Upper Abdomen: Stable exophytic lesion from the upper left kidney, Hounsfield units of 23, likely a complex cyst. No specific imaging follow-up is needed. No acute upper abdominal findings. Musculoskeletal: There is a lucency within the right posterior ninth rib that is slightly linear in configuration, rib fracture versus focal lesion. The possibility of pathologic fracture is raised. No additional focal bone lesions are seen. Sequela of postsurgical change in the right breast. Review of the MIP images confirms the above findings. IMPRESSION: 1. No pulmonary embolus. 2. Multiple bilateral pulmonary nodules. The largest nodule in the left upper lobe has increased in size and density from prior exam, now measuring 15 x 13 mm. There are multiple additional tiny pulmonary nodules that are all new. Findings are suspicious for metastatic disease. 3. Linear lucency within the right posterior ninth rib, rib fracture versus focal lesion. The possibility of pathologic fracture is raised. 4. Emphysema. Aortic Atherosclerosis (ICD10-I70.0) and Emphysema (ICD10-J43.9). Electronically Signed   By:  Narda Rutherford M.D.   On: 12/06/2022 21:13   DG Ribs Unilateral W/Chest Right  Result Date: 12/06/2022 CLINICAL DATA:  Right chest wall pain EXAM: RIGHT RIBS AND CHEST - 3+ VIEW COMPARISON:  CT chest dated 05/13/2022. Chest radiograph dated 11/17/2021. FINDINGS: Two dominant left lung nodules, similar to the prior chest radiograph, but warranting follow-up CT when correlating with prior CT report. Right lung is essentially clear. No pleural effusion or pneumothorax. Heart is normal in size.  Mild thoracic aortic atherosclerosis. No displaced right rib fracture is seen. Surgical clips in the right chest wall/breast. IMPRESSION: No displaced right rib fracture is seen. Two dominant left lung nodules, grossly unchanged. However, repeat CT chest is suggested for further evaluation. Electronically Signed   By: Charline Bills M.D.   On: 12/06/2022 19:10     Assessment and plan- Patient is a 68 y.o. female with prior history of multiple cancers including most recently breast cancer in [age] year old to discuss CT scan results and further management   Patient had a CT chest in January 2024 which had shown subcentimeter lung nodules most of which was stable and some new.  Surveillance PET CT scan was recommended in 6 months.  Patient presented to the ER with symptoms of chest wall pain and underwent CT angio chest which showed new and progressive lung nodules concerning for malignancy as well as ninth rib fracture potentially pathologic.  I plans to proceed with a PET CT scan at this time.  Based on PET scan findings we will need to decide what we can biopsy.  I will discuss her case at tumor board next week as well.  Follow-up with me to be decided based on PET scan findings    Visit Diagnosis 1. Malignant neoplasm of lower-outer quadrant of right breast of female, estrogen receptor negative (HCC)   2. Lung nodules      Dr. Owens Shark, MD, MPH [facility] at [facility] [phone] 12/08/2022 10:33 AM

## 2022-12-09 NOTE — Telephone Encounter (Signed)
Requested Prescriptions  Pending Prescriptions Disp Refills   meloxicam (MOBIC) 7.5 MG tablet [Pharmacy Med Name: MELOXICAM 7.5 MG TAB] 30 tablet 0    Sig: TAKE 1 TABLET BY MOUTH DAILY     Analgesics:  COX2 Inhibitors Failed - 12/08/2022  4:48 PM      Failed - Manual Review: Labs are only required if the patient has taken medication for more than 8 weeks.      Passed - HGB in normal range and within 360 days    Hemoglobin  Date Value Ref Range Status  12/06/2022 14.0 12.0 - 15.0 g/dL Final  62/95/2841 32.4 11.1 - 15.9 g/dL Final         Passed - Cr in normal range and within 360 days    Creatinine  Date Value Ref Range Status  04/08/2014 0.67 0.60 - 1.30 mg/dL Final   Creatinine, Ser  Date Value Ref Range Status  12/06/2022 0.70 0.44 - 1.00 mg/dL Final         Passed - HCT in normal range and within 360 days    HCT  Date Value Ref Range Status  12/06/2022 41.6 36.0 - 46.0 % Final   Hematocrit  Date Value Ref Range Status  11/03/2021 42.1 34.0 - 46.6 % Final         Passed - AST in normal range and within 360 days    AST  Date Value Ref Range Status  12/06/2022 24 15 - 41 U/L Final   SGOT(AST)  Date Value Ref Range Status  11/26/2012 25 15 - 37 Unit/L Final         Passed - ALT in normal range and within 360 days    ALT  Date Value Ref Range Status  12/06/2022 16 0 - 44 U/L Final   SGPT (ALT)  Date Value Ref Range Status  11/26/2012 21 12 - 78 U/L Final         Passed - eGFR is 30 or above and within 360 days    EGFR (African American)  Date Value Ref Range Status  04/08/2014 >60 >40mL/min Final  11/26/2012 >60  Final   GFR calc Af Amer  Date Value Ref Range Status  01/20/2020 >60 >60 mL/min Final   EGFR (Non-African Amer.)  Date Value Ref Range Status  04/08/2014 >60 >31mL/min Final    Comment:    eGFR values <60mL/min/1.73 m2 may be an indication of chronic kidney disease (CKD). Calculated eGFR, using the MRDR Study equation, is useful in   patients with stable renal function. The eGFR calculation will not be reliable in acutely ill patients when serum creatinine is changing rapidly. It is not useful in patients on dialysis. The eGFR calculation may not be applicable to patients at the low and high extremes of body sizes, pregnant women, and vegetarians.   11/26/2012 >60  Final    Comment:    eGFR values <71mL/min/1.73 m2 may be an indication of chronic kidney disease (CKD). Calculated eGFR is useful in patients with stable renal function. The eGFR calculation will not be reliable in acutely ill patients when serum creatinine is changing rapidly. It is not useful in  patients on dialysis. The eGFR calculation may not be applicable to patients at the low and high extremes of body sizes, pregnant women, and vegetarians.    GFR, Estimated  Date Value Ref Range Status  12/06/2022 >60 >60 mL/min Final    Comment:    (NOTE) Calculated using the CKD-EPI Creatinine  Equation (2021)    eGFR  Date Value Ref Range Status  11/03/2021 84 >59 mL/min/1.73 Final         Passed - Patient is not pregnant      Passed - Valid encounter within last 12 months    Recent Outpatient Visits           3 days ago Uncontrolled pain   [place] [facility] Merita Norton T, FNP   1 month ago Severe depression [facility])   [place] [facility] Merita Norton T, FNP   4 months ago Symptoms of upper respiratory infection (URI)   [place] [facility] [place], [place], PA-C   6 months ago Lung nodules   [facility] Merita Norton T, FNP   8 months ago Chronic pain syndrome   [facility] Jacky Kindle, FNP       Future Appointments             In 1 month MacDiarmid, Lorin Picket, MD [facility]

## 2022-12-12 DIAGNOSIS — N3946 Mixed incontinence: Secondary | ICD-10-CM | POA: Diagnosis not present

## 2022-12-13 ENCOUNTER — Telehealth: Payer: Self-pay

## 2022-12-13 NOTE — Telephone Encounter (Signed)
Transition Care Management Follow-up Telephone Call Date of discharge and from where: [place] 8/6 How have you been since you were released from the hospital? Still not doing good and in a lot of pain Any questions or concerns? No  Items Reviewed: Did the pt receive and understand the discharge instructions provided? Yes  Medications obtained and verified? No  Other? No  Any new allergies since your discharge? No  Dietary orders reviewed? No Do you have support at home? Yes    Follow up appointments reviewed:  PCP Hospital f/u appt confirmed? No  Scheduled to see  on  @ . Specialist Hospital f/u appt confirmed? No  Scheduled to see  on  @ . Are transportation arrangements needed? No  If their condition worsens, is the pt aware to call PCP or go to the Emergency Dept.? Yes Was the patient provided with contact information for the PCP's office or ED? Yes Was to pt encouraged to call back with questions or concerns? Yes

## 2022-12-14 ENCOUNTER — Other Ambulatory Visit: Payer: Self-pay | Admitting: Family Medicine

## 2022-12-14 DIAGNOSIS — G894 Chronic pain syndrome: Secondary | ICD-10-CM

## 2022-12-15 ENCOUNTER — Inpatient Hospital Stay: Payer: Medicare HMO

## 2022-12-15 ENCOUNTER — Telehealth: Payer: Self-pay

## 2022-12-15 ENCOUNTER — Encounter: Payer: Self-pay | Admitting: *Deleted

## 2022-12-15 NOTE — Telephone Encounter (Signed)
Patient called in to schedule for a colonoscopy.

## 2022-12-16 ENCOUNTER — Other Ambulatory Visit: Payer: Self-pay | Admitting: *Deleted

## 2022-12-16 ENCOUNTER — Inpatient Hospital Stay: Payer: Medicare HMO

## 2022-12-16 ENCOUNTER — Telehealth: Payer: Self-pay | Admitting: *Deleted

## 2022-12-16 ENCOUNTER — Telehealth: Payer: Self-pay

## 2022-12-16 ENCOUNTER — Encounter: Payer: Self-pay | Admitting: Oncology

## 2022-12-16 DIAGNOSIS — Z86010 Personal history of colonic polyps: Secondary | ICD-10-CM

## 2022-12-16 DIAGNOSIS — Z923 Personal history of irradiation: Secondary | ICD-10-CM | POA: Diagnosis not present

## 2022-12-16 DIAGNOSIS — Z8521 Personal history of malignant neoplasm of larynx: Secondary | ICD-10-CM | POA: Diagnosis not present

## 2022-12-16 DIAGNOSIS — Z87891 Personal history of nicotine dependence: Secondary | ICD-10-CM | POA: Diagnosis not present

## 2022-12-16 DIAGNOSIS — D519 Vitamin B12 deficiency anemia, unspecified: Secondary | ICD-10-CM | POA: Diagnosis not present

## 2022-12-16 DIAGNOSIS — Z9221 Personal history of antineoplastic chemotherapy: Secondary | ICD-10-CM | POA: Diagnosis not present

## 2022-12-16 DIAGNOSIS — Z23 Encounter for immunization: Secondary | ICD-10-CM

## 2022-12-16 DIAGNOSIS — E538 Deficiency of other specified B group vitamins: Secondary | ICD-10-CM

## 2022-12-16 DIAGNOSIS — Z853 Personal history of malignant neoplasm of breast: Secondary | ICD-10-CM | POA: Diagnosis not present

## 2022-12-16 DIAGNOSIS — Z808 Family history of malignant neoplasm of other organs or systems: Secondary | ICD-10-CM | POA: Diagnosis not present

## 2022-12-16 DIAGNOSIS — R918 Other nonspecific abnormal finding of lung field: Secondary | ICD-10-CM | POA: Diagnosis not present

## 2022-12-16 MED ORDER — CYANOCOBALAMIN 1000 MCG/ML IJ SOLN
1000.0000 ug | INTRAMUSCULAR | Status: DC
  Administered 2022-12-16: 1000 ug via INTRAMUSCULAR
  Filled 2022-12-16: qty 1

## 2022-12-16 MED ORDER — NA SULFATE-K SULFATE-MG SULF 17.5-3.13-1.6 GM/177ML PO SOLN: 1.0000 | Freq: Once | ORAL | 0 refills | Status: AC

## 2022-12-16 NOTE — Telephone Encounter (Signed)
Gastroenterology Pre-Procedure Review  Request Date: 01/05/2023 Requesting Physician: Dr. Servando Snare  PATIENT REVIEW QUESTIONS: The patient responded to the following health history questions as indicated:    1. Are you having any GI issues? yes (Dysphagia) 2. Do you have a personal history of Polyps? yes (10/29/2019) 3. Do you have a family history of Colon Cancer or Polyps? no 4. Diabetes Mellitus? no 5. Joint replacements in the past 12 months?no 6. Major health problems in the past 3 months?no 7. Any artificial heart valves, MVP, or defibrillator?no    MEDICATIONS & ALLERGIES:    Patient reports the following regarding taking any anticoagulation/antiplatelet therapy:   Plavix, Coumadin, Eliquis, Xarelto, Lovenox, Pradaxa, Brilinta, or Effient? no Aspirin? yes (81 mg)  Patient confirms/reports the following medications:  Current Outpatient Medications  Medication Sig Dispense Refill   albuterol (PROVENTIL) (2.5 MG/3ML) 0.083% nebulizer solution Take 3 mLs (2.5 mg total) by nebulization every 6 (six) hours as needed for wheezing or shortness of breath. 150 mL 1   albuterol (VENTOLIN HFA) 108 (90 Base) MCG/ACT inhaler Inhale 2 puffs into the lungs every 6 (six) hours as needed for shortness of breath. 6.7 g 0   alum & mag hydroxide-simeth (MAALOX PLUS) 400-400-40 MG/5ML suspension Take 15 mLs by mouth every 6 (six) hours as needed for indigestion. 355 mL 0   aspirin EC 81 MG tablet Take 1 tablet (81 mg total) by mouth daily. Swallow whole. 90 tablet 3   Budeson-Glycopyrrol-Formoterol (BREZTRI AEROSPHERE) 160-9-4.8 MCG/ACT AERO Inhale 2 puffs into the lungs in the morning and at bedtime. 5.9 g 5   buPROPion ER (WELLBUTRIN SR) 100 MG 12 hr tablet Take 1 tablet (100 mg total) by mouth 2 (two) times daily. 180 tablet 0   busPIRone (BUSPAR) 5 MG tablet Take 1 tablet (5 mg total) by mouth 3 (three) times daily. 270 tablet 0   ciprofloxacin (CIPRO) 250 MG tablet Take 1 tablet (250 mg total) by mouth 2  (two) times daily. 14 tablet 0   cyclobenzaprine (FLEXERIL) 5 MG tablet TAKE 1 TABLET BY MOUTH AT BEDTIME 30 tablet 0   DULoxetine (CYMBALTA) 60 MG capsule Take 1 capsule (60 mg total) by mouth 2 (two) times daily. 180 capsule 0   fluticasone (FLONASE) 50 MCG/ACT nasal spray Place 1 spray into both nostrils at bedtime. 16 g 6   gabapentin (NEURONTIN) 300 MG capsule TAKE 1 CAPSULE BY MOUTH 3 TIMES DAILY 60 capsule 5   lidocaine (LIDODERM) 5 % Place 1 patch onto the skin every 12 (twelve) hours as needed for up to 10 days. Remove & Discard patch after 12 hours of wear each day. 10 patch 0   meloxicam (MOBIC) 7.5 MG tablet TAKE 1 TABLET BY MOUTH DAILY 30 tablet 0   mirabegron ER (MYRBETRIQ) 25 MG TB24 tablet Take 25 mg by mouth. 2 tablets daily     montelukast (SINGULAIR) 10 MG tablet Take 1 tablet (10 mg total) by mouth at bedtime. 90 tablet 3   omeprazole (PRILOSEC) 40 MG capsule TAKE 1 CAPSULE BY MOUTH DAILY 90 capsule 0   rOPINIRole (REQUIP) 0.25 MG tablet TAKE 1 TABLET BY MOUTH AT BEDTIME 90 tablet 1   rosuvastatin (CRESTOR) 40 MG tablet Take 1 tablet (40 mg total) by mouth daily. 90 tablet 3   No current facility-administered medications for this visit.   Facility-Administered Medications Ordered in Other Visits  Medication Dose Route Frequency Provider Last Rate Last Admin   0.9 %  sodium chloride infusion  Intravenous Once PRN Creig Hines, MD       albuterol (PROVENTIL) (2.5 MG/3ML) 0.083% nebulizer solution 2.5 mg  2.5 mg Nebulization Once PRN Creig Hines, MD       cyanocobalamin ((VITAMIN B-12)) injection 1,000 mcg  1,000 mcg Intramuscular Q30 days Creig Hines, MD   1,000 mcg at 08/27/21 1537   cyanocobalamin (VITAMIN B12) injection 1,000 mcg  1,000 mcg Intramuscular Q30 days Creig Hines, MD   1,000 mcg at 03/16/22 1101    Patient confirms/reports the following allergies:  Allergies  Allergen Reactions   Sulfa Antibiotics Other (See Comments)    Mouth sore and raw     No orders of the defined types were placed in this encounter.   AUTHORIZATION INFORMATION Primary Insurance: 1D#: Group #:  Secondary Insurance: 1D#: Group #:  SCHEDULE INFORMATION: Date: 01/05/2023 Time: Location:  [facility]

## 2022-12-16 NOTE — Telephone Encounter (Signed)
Colonoscopy schedule with Dr Servando Snare on 01/05/2023

## 2022-12-16 NOTE — Telephone Encounter (Signed)
Patient left a voicemail on 12/15/2022 at 4:40pm got voicemail off the voicemail on 12/16/2022 at 7:35am.  She states that she is ready to schedule her colonoscopy and would like a call back to get this schedule

## 2022-12-16 NOTE — Telephone Encounter (Signed)
Colonoscopy schedule with Dr Servando Snare at [facility] on 01/05/2023

## 2022-12-19 ENCOUNTER — Ambulatory Visit
Admission: RE | Admit: 2022-12-19 | Discharge: 2022-12-19 | Disposition: A | Discharge status: Home / Self Care | Payer: Medicare HMO | Source: Ambulatory Visit | Attending: Oncology | Admitting: Oncology

## 2022-12-19 DIAGNOSIS — S2231XA Fracture of one rib, right side, initial encounter for closed fracture: Secondary | ICD-10-CM | POA: Diagnosis not present

## 2022-12-19 DIAGNOSIS — R918 Other nonspecific abnormal finding of lung field: Secondary | ICD-10-CM | POA: Insufficient documentation

## 2022-12-19 DIAGNOSIS — C50511 Malignant neoplasm of lower-outer quadrant of right female breast: Secondary | ICD-10-CM | POA: Insufficient documentation

## 2022-12-19 DIAGNOSIS — Z171 Estrogen receptor negative status [ER-]: Secondary | ICD-10-CM | POA: Diagnosis not present

## 2022-12-19 DIAGNOSIS — C50919 Malignant neoplasm of unspecified site of unspecified female breast: Secondary | ICD-10-CM | POA: Diagnosis not present

## 2022-12-19 DIAGNOSIS — C7800 Secondary malignant neoplasm of unspecified lung: Secondary | ICD-10-CM | POA: Diagnosis not present

## 2022-12-19 DIAGNOSIS — I2510 Atherosclerotic heart disease of native coronary artery without angina pectoris: Secondary | ICD-10-CM | POA: Diagnosis not present

## 2022-12-19 LAB — GLUCOSE, CAPILLARY: Glucose-Capillary: 85 mg/dL (ref 70–99)

## 2022-12-19 MED ORDER — FLUDEOXYGLUCOSE F - 18 (FDG) INJECTION
7.3800 | Freq: Once | INTRAVENOUS | Status: AC | PRN
  Administered 2022-12-19: 7.38 via INTRAVENOUS

## 2022-12-21 ENCOUNTER — Ambulatory Visit: Payer: Self-pay | Admitting: *Deleted

## 2022-12-21 NOTE — Patient Outreach (Signed)
  Care Coordination   Follow Up Visit Note   12/21/2022 Name: Denise Watts MRN: [id number] DOB: 07/26/1955  Denise Watts is a 67 y.o. year old female who sees Jacky Kindle, FNP for primary care. I spoke with  Denise Watts by phone today.  What matters to the patients health and wellness today?  Emotional support support related to family conflicts and medical concerns    Goals Addressed             This Visit's Progress    Mental health support       Interventions Today    Flowsheet Row Most Recent Value  Chronic Disease   Chronic disease during today's visit Other  [depession, rib fracture, lung nodules]  General Interventions   General Interventions Discussed/Reviewed --  [patient confirms fractured rib and nodules found in lungs-PET scan completed -results pending]  Doctor Visits Discussed/Reviewed Doctor Visits Discussed  [PET SCAN done 8/19 results pending]  Mental Health Interventions   Mental Health Discussed/Reviewed Mental Health Discussed, Mental Health Reviewed, Coping Strategies, Depression  [emotional support conitnues to be provided regarding family conflicts and current medical condition]              SDOH assessments and interventions completed:  No     Care Coordination Interventions:  Yes, provided   Follow up plan: Follow up call scheduled for 01/04/23    Encounter Outcome:  Pt. Visit Completed

## 2022-12-21 NOTE — Patient Instructions (Signed)
Visit Information  Thank you for taking time to visit with me today. Please don't hesitate to contact me if I can be of assistance to you.   Following are the goals we discussed today:  Please continue to follow up with your medical team regarding your medical plan and follow up   Our next appointment is by telephone on 01/04/23 at 11am  Please call the care guide team at [phone] if you need to cancel or reschedule your appointment.   If you are experiencing a Mental Health or Behavioral Health Crisis or need someone to talk to, please call [phone]   Patient verbalizes understanding of instructions and care plan provided today and agrees to view in MyChart. Active MyChart status and patient understanding of how to access instructions and care plan via MyChart confirmed with patient.     Telephone follow up appointment with care management team member scheduled for: 01/04/23  Verna Czech, LCSW Clinical Social Worker  [facility] Care Management [phone]

## 2022-12-27 ENCOUNTER — Other Ambulatory Visit: Payer: Self-pay | Admitting: *Deleted

## 2022-12-27 DIAGNOSIS — R911 Solitary pulmonary nodule: Secondary | ICD-10-CM | POA: Diagnosis not present

## 2022-12-27 DIAGNOSIS — Z171 Estrogen receptor negative status [ER-]: Secondary | ICD-10-CM

## 2022-12-27 DIAGNOSIS — R918 Other nonspecific abnormal finding of lung field: Secondary | ICD-10-CM

## 2022-12-28 ENCOUNTER — Ambulatory Visit (INDEPENDENT_AMBULATORY_CARE_PROVIDER_SITE_OTHER): Payer: Medicare HMO | Admitting: Family Medicine

## 2022-12-28 ENCOUNTER — Encounter: Payer: Self-pay | Admitting: Family Medicine

## 2022-12-28 ENCOUNTER — Encounter: Payer: Self-pay | Admitting: *Deleted

## 2022-12-28 VITALS — BP 110/67 | HR 98 | Ht 61.0 in | Wt 135.1 lb

## 2022-12-28 DIAGNOSIS — Z23 Encounter for immunization: Secondary | ICD-10-CM

## 2022-12-28 DIAGNOSIS — R4589 Other symptoms and signs involving emotional state: Secondary | ICD-10-CM | POA: Diagnosis not present

## 2022-12-28 NOTE — Progress Notes (Signed)
Established patient visit   Patient: Denise Watts   DOB: [birth year]   67 y.o. Female  MRN: [id number] Visit Date: 12/28/2022  Today's healthcare provider: Jacky Kindle, FNP  Introduced to nurse practitioner role and practice setting.  All questions answered.  Discussed provider/patient relationship and expectations.  Subjective    HPI HPI     Medical Management of Chronic Issues    Additional comments: Go over test results       Last edited by Rolly Salter, CMA on 12/28/2022  4:11 PM.      Medications: Outpatient Medications Prior to Visit  Medication Sig   albuterol (PROVENTIL) (2.5 MG/3ML) 0.083% nebulizer solution Take 3 mLs (2.5 mg total) by nebulization every 6 (six) hours as needed for wheezing or shortness of breath.   albuterol (VENTOLIN HFA) 108 (90 Base) MCG/ACT inhaler Inhale 2 puffs into the lungs every 6 (six) hours as needed for shortness of breath.   alum & mag hydroxide-simeth (MAALOX PLUS) 400-400-40 MG/5ML suspension Take 15 mLs by mouth every 6 (six) hours as needed for indigestion.   aspirin EC 81 MG tablet Take 1 tablet (81 mg total) by mouth daily. Swallow whole.   Budeson-Glycopyrrol-Formoterol (BREZTRI AEROSPHERE) 160-9-4.8 MCG/ACT AERO Inhale 2 puffs into the lungs in the morning and at bedtime.   buPROPion ER (WELLBUTRIN SR) 100 MG 12 hr tablet Take 1 tablet (100 mg total) by mouth 2 (two) times daily.   busPIRone (BUSPAR) 5 MG tablet Take 1 tablet (5 mg total) by mouth 3 (three) times daily.   ciprofloxacin (CIPRO) 250 MG tablet Take 1 tablet (250 mg total) by mouth 2 (two) times daily.   cyclobenzaprine (FLEXERIL) 5 MG tablet TAKE 1 TABLET BY MOUTH AT BEDTIME   DULoxetine (CYMBALTA) 60 MG capsule Take 1 capsule (60 mg total) by mouth 2 (two) times daily.   fluticasone (FLONASE) 50 MCG/ACT nasal spray Place 1 spray into both nostrils at bedtime.   gabapentin (NEURONTIN) 300 MG capsule TAKE 1 CAPSULE BY MOUTH 3 TIMES DAILY   meloxicam (MOBIC) 7.5  MG tablet TAKE 1 TABLET BY MOUTH DAILY   mirabegron ER (MYRBETRIQ) 25 MG TB24 tablet Take 25 mg by mouth. 2 tablets daily   montelukast (SINGULAIR) 10 MG tablet Take 1 tablet (10 mg total) by mouth at bedtime.   omeprazole (PRILOSEC) 40 MG capsule TAKE 1 CAPSULE BY MOUTH DAILY   rOPINIRole (REQUIP) 0.25 MG tablet TAKE 1 TABLET BY MOUTH AT BEDTIME   rosuvastatin (CRESTOR) 40 MG tablet Take 1 tablet (40 mg total) by mouth daily.   Facility-Administered Medications Prior to Visit  Medication Dose Route Frequency Provider   0.9 %  sodium chloride infusion   Intravenous Once PRN Creig Hines, MD   albuterol (PROVENTIL) (2.5 MG/3ML) 0.083% nebulizer solution 2.5 mg  2.5 mg Nebulization Once PRN Creig Hines, MD   cyanocobalamin ((VITAMIN B-12)) injection 1,000 mcg  1,000 mcg Intramuscular Q30 days Creig Hines, MD   cyanocobalamin (VITAMIN B12) injection 1,000 mcg  1,000 mcg Intramuscular Q30 days Creig Hines, MD    Review of Systems    Objective    BP 110/67 (BP Location: Left Arm, Patient Position: Sitting, Cuff Size: Normal)   Pulse 98   Ht 5\' 1"  (1.549 m)   Wt 135 lb 1.6 oz (61.3 kg)   SpO2 98%   BMI 25.53 kg/m    Physical Exam  PE not completed  No results found for any visits  on 12/28/22.  Assessment & Plan     Problem List Items Addressed This Visit       Other   Anxiety about health - Primary    Patient reports anxiety following conversation with Dr A over concerns for return of cancer and plan for EBUS; reassurance provided. Pt encouraged to stay positive, focus on what is in her control- diet, exercise, sleep, etc and follow advice of specialists      Other Visit Diagnoses     Need for influenza vaccination       Relevant Orders   Flu Vaccine Trivalent High Dose (Fluad) (Completed)      Return if symptoms worsen or fail to improve.     Leilani Merl, FNP, have reviewed all documentation for this visit. The documentation on 12/28/22 for the exam,  diagnosis, procedures, and orders are all accurate and complete.  Jacky Kindle, FNP  [facility] [phone] (phone) [phone] (fax)  [facility]

## 2022-12-28 NOTE — Assessment & Plan Note (Signed)
Patient reports anxiety following conversation with Dr A over concerns for return of cancer and plan for EBUS; reassurance provided. Pt encouraged to stay positive, focus on what is in her control- diet, exercise, sleep, etc and follow advice of specialists

## 2022-12-29 ENCOUNTER — Telehealth: Payer: Self-pay | Admitting: *Deleted

## 2022-12-29 NOTE — Telephone Encounter (Signed)
Received procedure clearance on 12/21/2022 from Dr Durene Fruits office.  Patient is cleared to have procedure.  Patient have been notified.

## 2022-12-29 NOTE — Telephone Encounter (Signed)
I called this morning just to see if I can get in touch with Denise Watts to make sure that she is okay with the date of 9[place]  7:30 arrival and  8:30 biopsy.  Asked her to call me back and see if that appointment is okay with her.  I called and told her all of this information yesterday as well as send in a MyChart I just want confirmation that that day is going to work

## 2022-12-29 NOTE — Progress Notes (Signed)
Called and spoke to patient on 8/29 and confirmed appointment time on 9/6 to arrive at 730 for an 830 appointment. Told patient to stop taking her baby aspirin on 12/31/22 and she confirmed. Told patient to be NPO after midnight

## 2022-12-29 NOTE — Telephone Encounter (Signed)
Patient called and said she got my message I went over all the appointment and instructions to the patient as well as I sent a MyChart with all the information and patient says she will look back up on it the day before she has to do it.  Also I made sure that she knows to have the last dose of baby aspirin on Saturday 8/31.  She is fine with that

## 2023-01-04 ENCOUNTER — Ambulatory Visit: Payer: Self-pay | Admitting: *Deleted

## 2023-01-04 ENCOUNTER — Other Ambulatory Visit: Payer: Self-pay | Admitting: Radiology

## 2023-01-04 DIAGNOSIS — Z01812 Encounter for preprocedural laboratory examination: Secondary | ICD-10-CM

## 2023-01-04 NOTE — Patient Outreach (Signed)
  Care Coordination   Follow Up Visit Note   01/04/2023 Name: LAILANEE ARENDS MRN: [id number] DOB: [birth year]  [name] is a 67 y.o. year old female who sees Jacky Kindle, FNP for primary care. I spoke with  [name] by phone today.  What matters to the patients health and wellness today?  Emotional support related to her current medical condition, resources for asset protection.    Goals Addressed             This Visit's Progress    Mental health support       Interventions Today    Flowsheet Row Most Recent Value  Chronic Disease   Chronic disease during today's visit Other  [depression, lung nodules]  General Interventions   General Interventions Discussed/Reviewed General Interventions Reviewed, Community Resources  Doctor Visits Discussed/Reviewed Doctor Visits Reviewed  [colonoscopy/endoscopy 01/05/23 Lung biopsy 01/06/23]  Mental Health Interventions   Mental Health Discussed/Reviewed Mental Health Reviewed, Coping Strategies, Depression  [continued to provide emotional support related to patient current medical condition- encouraged patient  to verbalize her feelings, reflective listening provided, use of positive coping strategies reinforced]  Safety Interventions   Safety Discussed/Reviewed Safety Discussed  [patient encouraged to contact the financial crimes division of the police department as well as legal aid-senior line [phone] to address spouse's possible financial exploitation and available resources to protect her assets]              SDOH assessments and interventions completed:  No     Care Coordination Interventions:  Yes, provided   Follow up plan: Follow up call scheduled for 01/25/23    Encounter Outcome:  Patient Visit Completed

## 2023-01-04 NOTE — Patient Instructions (Signed)
Visit Information  Thank you for taking time to visit with me today. Please don't hesitate to contact me if I can be of assistance to you.   Following are the goals we discussed today:  Please continue to follow up with your providers regarding questions related to your medical condition and treatment options Please contact the Financial Crimes Division and legal aid regarding possible financial exploitation and asset protection   Our next appointment is by telephone on 01/24/23 at 11am  Please call the care guide team at [phone] if you need to cancel or reschedule your appointment.   If you are experiencing a Mental Health or Behavioral Health Crisis or need someone to talk to, please call 911   Patient verbalizes understanding of instructions and care plan provided today and agrees to view in MyChart. Active MyChart status and patient understanding of how to access instructions and care plan via MyChart confirmed with patient.     Telephone follow up appointment with care management team member scheduled for: 01/24/23  Verna Czech, LCSW [place]  [facility], [facility] Licensed Clinical Social Worker Care Coordinator  Direct Dial: [phone]

## 2023-01-05 ENCOUNTER — Ambulatory Visit: Payer: Medicare HMO | Admitting: Anesthesiology

## 2023-01-05 ENCOUNTER — Encounter
Admission: RE | Disposition: A | Discharge status: Home / Self Care | Payer: Self-pay | Source: Home / Self Care | Attending: Gastroenterology

## 2023-01-05 ENCOUNTER — Ambulatory Visit: Admission: RE | Admit: 2023-01-05 | Payer: Medicare HMO | Source: Home / Self Care | Admitting: Gastroenterology

## 2023-01-05 ENCOUNTER — Encounter: Admission: RE | Payer: Self-pay | Source: Home / Self Care

## 2023-01-05 ENCOUNTER — Encounter: Payer: Self-pay | Admitting: Anesthesiology

## 2023-01-05 ENCOUNTER — Ambulatory Visit
Admission: RE | Admit: 2023-01-05 | Discharge: 2023-01-05 | Disposition: A | Discharge status: Home / Self Care | Payer: Medicare HMO | Attending: Gastroenterology | Admitting: Gastroenterology

## 2023-01-05 ENCOUNTER — Other Ambulatory Visit: Payer: Self-pay

## 2023-01-05 DIAGNOSIS — K222 Esophageal obstruction: Secondary | ICD-10-CM | POA: Insufficient documentation

## 2023-01-05 DIAGNOSIS — K644 Residual hemorrhoidal skin tags: Secondary | ICD-10-CM | POA: Diagnosis not present

## 2023-01-05 DIAGNOSIS — Z87891 Personal history of nicotine dependence: Secondary | ICD-10-CM | POA: Diagnosis not present

## 2023-01-05 DIAGNOSIS — D122 Benign neoplasm of ascending colon: Secondary | ICD-10-CM | POA: Insufficient documentation

## 2023-01-05 DIAGNOSIS — Z85828 Personal history of other malignant neoplasm of skin: Secondary | ICD-10-CM | POA: Insufficient documentation

## 2023-01-05 DIAGNOSIS — Z7951 Long term (current) use of inhaled steroids: Secondary | ICD-10-CM | POA: Diagnosis not present

## 2023-01-05 DIAGNOSIS — K2970 Gastritis, unspecified, without bleeding: Secondary | ICD-10-CM | POA: Diagnosis not present

## 2023-01-05 DIAGNOSIS — K5790 Diverticulosis of intestine, part unspecified, without perforation or abscess without bleeding: Secondary | ICD-10-CM | POA: Diagnosis not present

## 2023-01-05 DIAGNOSIS — R1310 Dysphagia, unspecified: Secondary | ICD-10-CM

## 2023-01-05 DIAGNOSIS — K2950 Unspecified chronic gastritis without bleeding: Secondary | ICD-10-CM | POA: Diagnosis not present

## 2023-01-05 DIAGNOSIS — K649 Unspecified hemorrhoids: Secondary | ICD-10-CM | POA: Diagnosis not present

## 2023-01-05 DIAGNOSIS — Z8521 Personal history of malignant neoplasm of larynx: Secondary | ICD-10-CM | POA: Diagnosis not present

## 2023-01-05 DIAGNOSIS — K635 Polyp of colon: Secondary | ICD-10-CM | POA: Diagnosis not present

## 2023-01-05 DIAGNOSIS — D123 Benign neoplasm of transverse colon: Secondary | ICD-10-CM | POA: Diagnosis not present

## 2023-01-05 DIAGNOSIS — Z860100 Personal history of colon polyps, unspecified: Secondary | ICD-10-CM

## 2023-01-05 DIAGNOSIS — K219 Gastro-esophageal reflux disease without esophagitis: Secondary | ICD-10-CM | POA: Diagnosis not present

## 2023-01-05 DIAGNOSIS — Z1211 Encounter for screening for malignant neoplasm of colon: Secondary | ICD-10-CM | POA: Insufficient documentation

## 2023-01-05 DIAGNOSIS — K3189 Other diseases of stomach and duodenum: Secondary | ICD-10-CM | POA: Diagnosis not present

## 2023-01-05 DIAGNOSIS — D124 Benign neoplasm of descending colon: Secondary | ICD-10-CM | POA: Diagnosis not present

## 2023-01-05 DIAGNOSIS — Z9221 Personal history of antineoplastic chemotherapy: Secondary | ICD-10-CM | POA: Diagnosis not present

## 2023-01-05 DIAGNOSIS — K5730 Diverticulosis of large intestine without perforation or abscess without bleeding: Secondary | ICD-10-CM | POA: Diagnosis not present

## 2023-01-05 DIAGNOSIS — I2510 Atherosclerotic heart disease of native coronary artery without angina pectoris: Secondary | ICD-10-CM | POA: Diagnosis not present

## 2023-01-05 DIAGNOSIS — Z923 Personal history of irradiation: Secondary | ICD-10-CM | POA: Diagnosis not present

## 2023-01-05 DIAGNOSIS — K319 Disease of stomach and duodenum, unspecified: Secondary | ICD-10-CM

## 2023-01-05 DIAGNOSIS — R1314 Dysphagia, pharyngoesophageal phase: Secondary | ICD-10-CM | POA: Diagnosis not present

## 2023-01-05 DIAGNOSIS — Z853 Personal history of malignant neoplasm of breast: Secondary | ICD-10-CM | POA: Diagnosis not present

## 2023-01-05 HISTORY — PX: ESOPHAGOGASTRODUODENOSCOPY (EGD) WITH PROPOFOL: SHX5813

## 2023-01-05 HISTORY — PX: COLONOSCOPY: SHX5424

## 2023-01-05 HISTORY — PX: BIOPSY: SHX5522

## 2023-01-05 HISTORY — PX: POLYPECTOMY: SHX5525

## 2023-01-05 SURGERY — COLONOSCOPY WITH PROPOFOL
Anesthesia: General

## 2023-01-05 SURGERY — COLONOSCOPY
Anesthesia: General

## 2023-01-05 MED ORDER — LIDOCAINE HCL (CARDIAC) PF 100 MG/5ML IV SOSY
PREFILLED_SYRINGE | INTRAVENOUS | Status: DC | PRN
  Administered 2023-01-05: 40 mg via INTRAVENOUS

## 2023-01-05 MED ORDER — PROPOFOL 1000 MG/100ML IV EMUL
INTRAVENOUS | Status: AC
  Filled 2023-01-05: qty 100

## 2023-01-05 MED ORDER — PHENYLEPHRINE 80 MCG/ML (10ML) SYRINGE FOR IV PUSH (FOR BLOOD PRESSURE SUPPORT)
PREFILLED_SYRINGE | INTRAVENOUS | Status: DC | PRN
  Administered 2023-01-05 (×2): 80 ug via INTRAVENOUS
  Administered 2023-01-05: 40 ug via INTRAVENOUS

## 2023-01-05 MED ORDER — DEXMEDETOMIDINE HCL IN NACL 80 MCG/20ML IV SOLN
INTRAVENOUS | Status: DC | PRN
  Administered 2023-01-05 (×4): 4 ug via INTRAVENOUS

## 2023-01-05 MED ORDER — PROPOFOL 10 MG/ML IV BOLUS
INTRAVENOUS | Status: AC
  Filled 2023-01-05: qty 20

## 2023-01-05 MED ORDER — PROPOFOL 10 MG/ML IV BOLUS
INTRAVENOUS | Status: DC | PRN
  Administered 2023-01-05: 20 mg via INTRAVENOUS
  Administered 2023-01-05: 80 mg via INTRAVENOUS
  Administered 2023-01-05 (×5): 20 mg via INTRAVENOUS

## 2023-01-05 MED ORDER — PROPOFOL 500 MG/50ML IV EMUL
INTRAVENOUS | Status: DC | PRN
  Administered 2023-01-05: 175 ug/kg/min via INTRAVENOUS

## 2023-01-05 MED ORDER — SODIUM CHLORIDE 0.9 % IV SOLN: INTRAVENOUS | Status: DC

## 2023-01-05 NOTE — Transfer of Care (Signed)
Immediate Anesthesia Transfer of Care Note  Patient: Denise Watts  Procedure(s) Performed: COLONOSCOPY ESOPHAGOGASTRODUODENOSCOPY (EGD) WITH PROPOFOL BIOPSY BALLOON ENTEROSCOPY POLYPECTOMY  Patient Location: Endoscopy Unit  Anesthesia Type:MAC  Level of Consciousness: drowsy and patient cooperative  Airway & Oxygen Therapy: Patient Spontanous Breathing and Patient connected to face mask oxygen  Post-op Assessment: Report given to RN, Post -op Vital signs reviewed and stable, and Patient moving all extremities X 4  Post vital signs: Reviewed and stable  Last Vitals:  Vitals Value Taken Time  BP 121/66 01/05/23 1153  Temp 35.8 C 01/05/23 1150  Pulse 66 01/05/23 1153  Resp 16 01/05/23 1153  SpO2 100 % 01/05/23 1153  Vitals shown include unfiled device data.  Last Pain:  Vitals:   01/05/23 1150  TempSrc: Temporal  PainSc: Asleep         Complications: No notable events documented.

## 2023-01-05 NOTE — Anesthesia Postprocedure Evaluation (Signed)
Anesthesia Post Note  Patient: MARCELLENE RAGGS  Procedure(s) Performed: COLONOSCOPY ESOPHAGOGASTRODUODENOSCOPY (EGD) WITH PROPOFOL BIOPSY BALLOON ENTEROSCOPY POLYPECTOMY  Patient location during evaluation: Endoscopy Anesthesia Type: General Level of consciousness: awake and alert Pain management: pain level controlled Vital Signs Assessment: post-procedure vital signs reviewed and stable Respiratory status: spontaneous breathing, nonlabored ventilation, respiratory function stable and patient connected to nasal cannula oxygen Cardiovascular status: blood pressure returned to baseline and stable Postop Assessment: no apparent nausea or vomiting Anesthetic complications: no   No notable events documented.   Last Vitals:  Vitals:   01/05/23 1210 01/05/23 1220  BP: (!) 99/53 112/67  Pulse: 70 64  Resp: 13 14  Temp:    SpO2: 100% 100%    Last Pain:  Vitals:   01/05/23 1220  TempSrc:   PainSc: 0-No pain                 Cleda Mccreedy Anthonny Schiller

## 2023-01-05 NOTE — Progress Notes (Signed)
Patient for CT guided Lung Biopsy on Friday 01/06/2023, I called and spoke with the patient on the phone and gave pre-procedure instructions. Pt was made aware to be here at 7:30a, last dose of ASA 81mg  was Sat 12/31/22,  NPO after MN prior to procedure as well as driver post procedure/recovery/discharge. Pt stated understanding.  Called 12/30/22 and 01/05/23

## 2023-01-05 NOTE — H&P (Signed)
Arlyss Repress, MD [address]  [address]  [place], [place] [address]  Main: [phone]  Fax: [phone] Pager: [phone]  Primary Care Physician:  Jacky Kindle, FNP Primary Gastroenterologist:  Dr. Arlyss Repress  Pre-Procedure History & Physical: HPI:  Denise Watts is a 67 y.o. female is here for an EGD and a colonoscopy.   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   Cough 03/02/2017   INTERMTTENT DRY COUGH   Depression    GERD (gastroesophageal reflux disease)    History of hiatal hernia    Personal history of chemotherapy    Personal history of radiation therapy    Skin cancer    Throat cancer (HCC) 1998   radiation   Wears dentures    full upper and lower    Past Surgical History:  Procedure Laterality Date   BREAST BIOPSY Right 1997   positive   BREAST BIOPSY Right 11/15/2017   INVASIVE MAMMARY CARCINOMA triple negative   BREAST BIOPSY Right 07/20/2020   [place] bx, Q marker, neg   BREAST LUMPECTOMY Right 1997   2019 also   BREAST LUMPECTOMY WITH SENTINEL LYMPH NODE BIOPSY Right 12/08/2017   Procedure: BREAST LUMPECTOMY WITH SENTINEL LYMPH NODE BX;  Surgeon: Earline Mayotte, MD;  Location: [facility];  Service: General;  Laterality: Right;   BREAST REDUCTION WITH MASTOPEXY Left 06/10/2019   Procedure: Left breast mastopexy reduction for symmetry;  Surgeon: Peggye Form, DO;  Location: [facility];  Service: Plastics;  Laterality: Left;  total 2.5 hours   CATARACT EXTRACTION W/PHACO Right 02/07/2022   Procedure: CATARACT EXTRACTION PHACO AND INTRAOCULAR LENS PLACEMENT (IOC) RIGHT DIABETIC;  Surgeon: Nevada Crane, MD;  Location: [facility];  Service: Ophthalmology;  Laterality: Right;   3.60 00:34.7   CATARACT EXTRACTION W/PHACO Left 02/28/2022   Procedure: CATARACT EXTRACTION PHACO AND INTRAOCULAR LENS PLACEMENT (IOC) LEFT DIABETIC 4.11 00:26.1;  Surgeon: Nevada Crane, MD;  Location: [facility];  Service: Ophthalmology;  Laterality: Left;   COLONOSCOPY  2015   COLONOSCOPY WITH PROPOFOL N/A 10/29/2019   Procedure: COLONOSCOPY WITH PROPOFOL;  Surgeon: Midge Minium, MD;  Location: [facility];  Service: Endoscopy;  Laterality: N/A;   ESOPHAGOGASTRODUODENOSCOPY (EGD) WITH PROPOFOL N/A 05/24/2019   Procedure: ESOPHAGOGASTRODUODENOSCOPY (EGD) WITH PROPOFOL;  Surgeon: Midge Minium, MD;  Location: [facility];  Service: Endoscopy;  Laterality: N/A;   ESOPHAGOGASTRODUODENOSCOPY (EGD) WITH PROPOFOL N/A 09/06/2022   Procedure: ESOPHAGOGASTRODUODENOSCOPY (EGD) WITH PROPOFOL;  Surgeon: Midge Minium, MD;  Location: [facility];  Service: Endoscopy;  Laterality: N/A;   ESOPHAGUS SURGERY     KNEE SURGERY Right    LIPOSUCTION WITH LIPOFILLING Right 06/10/2019   Procedure: right breast scar and capsule release with fat grafting;  Surgeon: Peggye Form, DO;  Location: [facility];  Service: Plastics;  Laterality: Right;   PORT-A-CATH REMOVAL     PORTACATH PLACEMENT Right 01/17/2018   Procedure: INSERTION PORT-A-CATH- RIGHT;  Surgeon: Earline Mayotte, MD;  Location: [facility];  Service: General;  Laterality: Right;   ROBOTIC ADRENALECTOMY Right 12/28/2016   Procedure: ROBOTIC ADRENALECTOMY;  Surgeon: Vanna Scotland, MD;  Location: [facility];  Service: Urology;  Laterality: Right;   SKIN CANCER  EXCISION     THROAT SURGERY  1998   throat cancer    TUBAL LIGATION     VENTRAL HERNIA REPAIR N/A 03/03/2017   Procedure: HERNIA REPAIR VENTRAL ADULT;  Surgeon: Ricarda Frame, MD;  Location: [facility];  Service: General;  Laterality: N/A;    Prior to Admission medications   Medication Sig Start Date End Date Taking? Authorizing Provider  albuterol  (PROVENTIL) (2.5 MG/3ML) 0.083% nebulizer solution Take 3 mLs (2.5 mg total) by nebulization every 6 (six) hours as needed for wheezing or shortness of breath. 05/20/22   Jacky Kindle, FNP  albuterol (VENTOLIN HFA) 108 (90 Base) MCG/ACT inhaler Inhale 2 puffs into the lungs every 6 (six) hours as needed for shortness of breath. 01/29/21   Menshew, Charlesetta Ivory, PA-C  alum & mag hydroxide-simeth (MAALOX PLUS) 400-400-40 MG/5ML suspension Take 15 mLs by mouth every 6 (six) hours as needed for indigestion. 05/23/22   Jacky Kindle, FNP  aspirin EC 81 MG tablet Take 1 tablet (81 mg total) by mouth daily. Swallow whole. 12/08/21   Furth, Cadence H, PA-C  Budeson-Glycopyrrol-Formoterol (BREZTRI AEROSPHERE) 160-9-4.8 MCG/ACT AERO Inhale 2 puffs into the lungs in the morning and at bedtime. 12/15/21   Jacky Kindle, FNP  buPROPion ER [facility] SR) 100 MG 12 hr tablet Take 1 tablet (100 mg total) by mouth 2 (two) times daily. 10/12/22   Jacky Kindle, FNP  busPIRone (BUSPAR) 5 MG tablet Take 1 tablet (5 mg total) by mouth 3 (three) times daily. 10/12/22   Jacky Kindle, FNP  ciprofloxacin (CIPRO) 250 MG tablet Take 1 tablet (250 mg total) by mouth 2 (two) times daily. 11/28/22   Alfredo Martinez, MD  cyclobenzaprine (FLEXERIL) 5 MG tablet TAKE 1 TABLET BY MOUTH AT BEDTIME 12/14/22   Jacky Kindle, FNP  DULoxetine (CYMBALTA) 60 MG capsule Take 1 capsule (60 mg total) by mouth 2 (two) times daily. 10/12/22   Jacky Kindle, FNP  fluticasone (FLONASE) 50 MCG/ACT nasal spray Place 1 spray into both nostrils at bedtime. 06/19/20   Margaretann Loveless, PA-C  gabapentin (NEURONTIN) 300 MG capsule TAKE 1 CAPSULE BY MOUTH 3 TIMES DAILY 10/11/22   Merita Norton T, FNP  meloxicam (MOBIC) 7.5 MG tablet TAKE 1 TABLET BY MOUTH DAILY 12/09/22   Erasmo Downer, MD  mirabegron ER (MYRBETRIQ) 25 MG TB24 tablet Take 25 mg by mouth. 2 tablets daily    [provider]  montelukast (SINGULAIR) 10 MG tablet Take 1 tablet  (10 mg total) by mouth at bedtime. 07/14/22   Erasmo Downer, MD  omeprazole (PRILOSEC) 40 MG capsule TAKE 1 CAPSULE BY MOUTH DAILY 09/29/22   Merita Norton T, FNP  rOPINIRole (REQUIP) 0.25 MG tablet TAKE 1 TABLET BY MOUTH AT BEDTIME 08/04/22   Jacky Kindle, FNP  rosuvastatin (CRESTOR) 40 MG tablet Take 1 tablet (40 mg total) by mouth daily. 12/15/21   Jacky Kindle, FNP    Allergies as of 01/05/2023 - Review Complete 01/05/2023  Allergen Reaction Noted   Sulfa antibiotics Other (See Comments) 10/28/2020    Family History  Problem Relation Age of Onset   Diabetes Mother    Heart attack Mother    Cancer Sister        cancer of eyes, spread to lung, other places. died at 70   Cancer Sister 16       unknown primary, spread to bone, brain died in 15's   Cancer Paternal Aunt  unk type   Cancer Paternal Grandmother        type unk   Prostate cancer Neg Hx    Kidney cancer Neg Hx    Bladder Cancer Neg Hx    Breast cancer Neg Hx     Social History   Socioeconomic History   Marital status: Married    Spouse name: Not on file   Number of children: 4   Years of education: Not on file   Highest education level: 10th grade  Occupational History   Occupation: retired  Tobacco Use   Smoking status: Former    Current packs/day: 0.00    Average packs/day: 1.5 packs/day for 30.0 years (45.0 ttl pk-yrs)    Types: Cigarettes    Start date: 05/02/1965    Quit date: 05/03/1995    Years since quitting: 27.6    Passive exposure: Past   Smokeless tobacco: Never  Vaping Use   Vaping status: Never Used  Substance and Sexual Activity   Alcohol use: Not Currently    Alcohol/week: 4.0 standard drinks of alcohol    Types: 4 Shots of liquor per week    Comment: none   Drug use: No   Sexual activity: Not Currently    Birth control/protection: Post-menopausal  Other Topics Concern   Not on file  Social History Narrative   Not on file   Social Determinants of Health   Financial  Resource Strain: Low Risk  (12/02/2021)   Overall Financial Resource Strain (CARDIA)    Difficulty of Paying Living Expenses: Not very hard  Food Insecurity: No Food Insecurity (12/05/2022)   Hunger Vital Sign    Worried About Running Out of Food in the Last Year: Never true    Ran Out of Food in the Last Year: Never true  Transportation Needs: No Transportation Needs (12/05/2022)   PRAPARE - Administrator, Civil Service (Medical): No    Lack of Transportation (Non-Medical): No  Physical Activity: Insufficiently Active (12/05/2022)   Exercise Vital Sign    Days of Exercise per Week: 3 days    Minutes of Exercise per Session: 30 min  Stress: Stress Concern Present (12/05/2022)   [employer] of Occupational Health - Occupational Stress Questionnaire    Feeling of Stress : Rather much  Social Connections: Moderately Isolated (12/05/2022)   Social Connection and Isolation Panel [NHANES]    Frequency of Communication with Friends and Family: Three times a week    Frequency of Social Gatherings with Friends and Family: Twice a week    Attends Religious Services: Never    Database administrator or Organizations: No    Attends Banker Meetings: Never    Marital Status: Married  Catering manager Violence: Not At Risk (12/05/2022)   Humiliation, Afraid, Rape, and Kick questionnaire    Fear of Current or Ex-Partner: No    Emotionally Abused: No    Physically Abused: No    Sexually Abused: No    Review of Systems: See HPI, otherwise negative ROS  Physical Exam: BP (!) 131/112   Pulse 88   Temp (!) 97.3 F (36.3 C) (Temporal)   Resp 18   Ht 5\' 1"  (1.549 m)   Wt 60.3 kg   SpO2 98%   BMI 25.13 kg/m  General:   Alert,  pleasant and cooperative in NAD Head:  Normocephalic and atraumatic. Neck:  Supple; no masses or thyromegaly. Lungs:  Clear throughout to auscultation.    Heart:  Regular rate and rhythm. Abdomen:  Soft, nontender and nondistended. Normal bowel  sounds, without guarding, and without rebound.   Neurologic:  Alert and  oriented x4;  grossly normal neurologically.  Impression/Plan: Denise Watts is here for an EGD and colonoscopy to be performed for dysphagia, esophageal stricture, h/o colon polyps  Risks, benefits, limitations, and alternatives regarding  colonoscopy have been reviewed with the patient.  Questions have been answered.  All parties agreeable.   Lannette Donath, MD  01/05/2023, 10:52 AM

## 2023-01-05 NOTE — Anesthesia Preprocedure Evaluation (Addendum)
Anesthesia Evaluation  Patient identified by MRN, date of birth, ID band Patient awake    Reviewed: Allergy & Precautions, NPO status , Patient's Chart, lab work & pertinent test results  History of Anesthesia Complications Negative for: history of anesthetic complications  Airway Mallampati: III  TM Distance: <3 FB Neck ROM: full    Dental  (+) Upper Dentures, Lower Dentures   Pulmonary pneumonia, former smoker   Pulmonary exam normal        Cardiovascular (-) angina + CAD  Normal cardiovascular exam     Neuro/Psych  PSYCHIATRIC DISORDERS      negative neurological ROS     GI/Hepatic Neg liver ROS, hiatal hernia,GERD  Controlled,,  Endo/Other  negative endocrine ROS    Renal/GU negative Renal ROS  negative genitourinary   Musculoskeletal  (+) Arthritis ,    Abdominal   Peds  Hematology negative hematology ROS (+)   Anesthesia Other Findings Patient reports that they do not think that any food or pills are stuck in their throat at this time.  Past Medical History: No date: Anxiety No date: Arthritis     Comment:  KNEE RIGHT 1997: Breast cancer (HCC)     Comment:  right breast lumpectomy with radiation 11/15/2017: Breast cancer (HCC)     Comment:  INVASIVE MAMMARY CARCINOMA/ triple negative 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) 03/02/2017: Cough     Comment:  INTERMTTENT DRY COUGH No date: Depression No date: GERD (gastroesophageal reflux disease) No date: History of hiatal hernia No date: Personal history of chemotherapy No date: Personal history of radiation therapy No date: Skin cancer 1998: Throat cancer (HCC)     Comment:  radiation No date: Wears dentures     Comment:  full upper and lower  Past Surgical History: 1997: BREAST BIOPSY; Right     Comment:  positive 11/15/2017: BREAST BIOPSY; Right     Comment:  INVASIVE MAMMARY CARCINOMA triple negative 07/20/2020: BREAST BIOPSY; Right      Comment:  [place] bx, Q marker, neg 1997: BREAST LUMPECTOMY; Right     Comment:  2019 also 12/08/2017: BREAST LUMPECTOMY WITH SENTINEL LYMPH NODE BIOPSY; Right     Comment:  Procedure: BREAST LUMPECTOMY WITH SENTINEL LYMPH NODE               BX;  Surgeon: Earline Mayotte, MD;  Location: [facility];  Service: General;  Laterality: Right; 06/10/2019: BREAST REDUCTION WITH MASTOPEXY; Left     Comment:  Procedure: Left breast mastopexy reduction for symmetry;              Surgeon: Peggye Form, DO;  Location: [place]               [facility];  Service: Plastics;  Laterality: Left;                total 2.5 hours 02/07/2022: CATARACT EXTRACTION W/PHACO; Right     Comment:  Procedure: CATARACT EXTRACTION PHACO AND INTRAOCULAR               LENS PLACEMENT (IOC) RIGHT DIABETIC;  Surgeon: Nevada Crane, MD;  Location: [facility];                Service: Ophthalmology;  Laterality: Right;  3.60 00:34.7 02/28/2022: CATARACT EXTRACTION W/PHACO; Left     Comment:  Procedure: CATARACT EXTRACTION PHACO AND INTRAOCULAR               LENS PLACEMENT (IOC) LEFT DIABETIC 4.11 00:26.1;                Surgeon: Nevada Crane, MD;  Location: [facility];  Service: Ophthalmology;  Laterality: Left; 2015: COLONOSCOPY 10/29/2019: COLONOSCOPY WITH PROPOFOL; N/A     Comment:  Procedure: COLONOSCOPY WITH PROPOFOL;  Surgeon: Midge Minium, MD;  Location: [facility];  Service:               Endoscopy;  Laterality: N/A; 05/24/2019: ESOPHAGOGASTRODUODENOSCOPY (EGD) WITH PROPOFOL; N/A     Comment:  Procedure: ESOPHAGOGASTRODUODENOSCOPY (EGD) WITH               PROPOFOL;  Surgeon: Midge Minium, MD;  Location: [facility]               [facility];  Service: Endoscopy;  Laterality: N/A; 09/06/2022: ESOPHAGOGASTRODUODENOSCOPY (EGD) WITH PROPOFOL; N/A     Comment:  Procedure: ESOPHAGOGASTRODUODENOSCOPY (EGD) WITH                PROPOFOL;  Surgeon: Midge Minium, MD;  Location: [facility]               [facility];  Service: Endoscopy;  Laterality: N/A; No date: ESOPHAGUS SURGERY No date: KNEE SURGERY; Right 06/10/2019: LIPOSUCTION WITH LIPOFILLING; Right     Comment:  Procedure: right breast scar and capsule release with               fat grafting;  Surgeon: Peggye Form, DO;                Location: [facility];  Service: Plastics;               Laterality: Right; No date: PORT-A-CATH REMOVAL 01/17/2018: PORTACATH PLACEMENT; Right     Comment:  Procedure: INSERTION PORT-A-CATH- RIGHT;  Surgeon:               Earline Mayotte, MD;  Location: [facility];  Service:               General;  Laterality: Right; 12/28/2016: ROBOTIC ADRENALECTOMY; Right     Comment:  Procedure: ROBOTIC ADRENALECTOMY;  Surgeon: Vanna Scotland, MD;  Location: [facility];  Service: Urology;                Laterality: Right; No date: SKIN CANCER EXCISION 1998: THROAT SURGERY     Comment:  throat cancer  No date: TUBAL LIGATION 03/03/2017: VENTRAL HERNIA REPAIR; N/A     Comment:  Procedure: HERNIA REPAIR VENTRAL ADULT;  Surgeon:               Ricarda Frame, MD;  Location: [facility];  Service:               General;  Laterality: N/A;     Reproductive/Obstetrics negative OB ROS                             Anesthesia Physical Anesthesia Plan  ASA: 3  Anesthesia Plan: General  Post-op Pain Management:    Induction: Intravenous  PONV Risk Score and Plan: Propofol infusion and TIVA  Airway Management Planned: Natural Airway and Nasal Cannula  Additional Equipment:   Intra-op Plan:   Post-operative Plan:   Informed Consent: I have reviewed the patients History and Physical, chart, labs and discussed the procedure including the risks, benefits and alternatives for the proposed anesthesia with the patient or authorized representative who has indicated his/her  understanding and acceptance.     Dental Advisory Given  Plan Discussed with: Anesthesiologist, CRNA and Surgeon  Anesthesia Plan Comments: (Patient consented for risks of anesthesia including but not limited to:  - adverse reactions to medications - risk of airway placement if required - damage to eyes, teeth, lips or other oral mucosa - nerve damage due to positioning  - sore throat or hoarseness - Damage to heart, brain, nerves, lungs, other parts of body or loss of life  Patient voiced understanding.)       Anesthesia Quick Evaluation

## 2023-01-05 NOTE — Addendum Note (Signed)
Addendum  created 01/05/23 1244 by Babyboy Loya, Cleda Mccreedy, MD   Attestation recorded in [place], Intraprocedure Attestations filed

## 2023-01-05 NOTE — H&P (Signed)
Chief Complaint: Patient was seen in consultation today for lung nodules  Referring Physician(s): Rao,Archana C  Supervising Physician: Pernell Dupre  Patient Status: [facility] - Out-pt  History of Present Illness: Denise Watts is a 67 y.o. female with PMH significant for anxiety, breast cancer, cancer of the vocal cord, depression, and GERD being seen today in relation to lung nodules. Patient has been under the care of Dr Smith Robert from Oncology service. Recent PET scan showed intrathoracic nodal and pulmonary metastatic disease, as well as a hypermetabolic right ninth rib fracture worrisome for metastasis. Patient was referred to IR for image-guided lung biopsy.  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   Cough 03/02/2017   INTERMTTENT DRY COUGH   Depression    GERD (gastroesophageal reflux disease)    History of hiatal hernia    Personal history of chemotherapy    Personal history of radiation therapy    Skin cancer    Throat cancer (HCC) 1998   radiation   Wears dentures    full upper and lower    Past Surgical History:  Procedure Laterality Date   BREAST BIOPSY Right 1997   positive   BREAST BIOPSY Right 11/15/2017   INVASIVE MAMMARY CARCINOMA triple negative   BREAST BIOPSY Right 07/20/2020   [place] bx, Q marker, neg   BREAST LUMPECTOMY Right 1997   2019 also   BREAST LUMPECTOMY WITH SENTINEL LYMPH NODE BIOPSY Right 12/08/2017   Procedure: BREAST LUMPECTOMY WITH SENTINEL LYMPH NODE BX;  Surgeon: Earline Mayotte, MD;  Location: [facility] ORS;  Service: General;  Laterality: Right;   BREAST REDUCTION WITH MASTOPEXY Left 06/10/2019   Procedure: Left breast mastopexy reduction for symmetry;  Surgeon: Peggye Form, DO;  Location: [facility];  Service: Plastics;  Laterality: Left;  total 2.5 hours   CATARACT EXTRACTION W/PHACO Right 02/07/2022   Procedure: CATARACT EXTRACTION PHACO AND INTRAOCULAR LENS PLACEMENT (IOC) RIGHT DIABETIC;  Surgeon: Nevada Crane, MD;  Location: [facility];  Service: Ophthalmology;  Laterality: Right;  3.60 00:34.7   CATARACT EXTRACTION W/PHACO Left 02/28/2022   Procedure: CATARACT EXTRACTION PHACO AND INTRAOCULAR LENS PLACEMENT (IOC) LEFT DIABETIC 4.11 00:26.1;  Surgeon: Nevada Crane, MD;  Location: [facility];  Service: Ophthalmology;  Laterality: Left;   COLONOSCOPY  2015   COLONOSCOPY WITH PROPOFOL N/A 10/29/2019   Procedure: COLONOSCOPY WITH PROPOFOL;  Surgeon: Midge Minium, MD;  Location: [facility];  Service: Endoscopy;  Laterality: N/A;   ESOPHAGOGASTRODUODENOSCOPY (EGD) WITH PROPOFOL N/A 05/24/2019   Procedure: ESOPHAGOGASTRODUODENOSCOPY (EGD) WITH PROPOFOL;  Surgeon: Midge Minium, MD;  Location: [facility];  Service: Endoscopy;  Laterality: N/A;   ESOPHAGOGASTRODUODENOSCOPY (EGD) WITH PROPOFOL N/A 09/06/2022   Procedure: ESOPHAGOGASTRODUODENOSCOPY (EGD) WITH PROPOFOL;  Surgeon: Midge Minium, MD;  Location: [facility] ENDOSCOPY;  Service: Endoscopy;  Laterality: N/A;   ESOPHAGUS SURGERY     KNEE SURGERY Right    LIPOSUCTION WITH LIPOFILLING Right 06/10/2019   Procedure: right breast scar and capsule release with fat grafting;  Surgeon: Peggye Form, DO;  Location: [facility];  Service: Plastics;  Laterality: Right;   PORT-A-CATH REMOVAL     PORTACATH PLACEMENT Right 01/17/2018   Procedure: INSERTION PORT-A-CATH- RIGHT;  Surgeon: Earline Mayotte, MD;  Location: [facility];  Service: General;  Laterality: Right;   ROBOTIC ADRENALECTOMY Right 12/28/2016   Procedure: ROBOTIC ADRENALECTOMY;  Surgeon: Vanna Scotland, MD;  Location: [facility];  Service: Urology;  Laterality: Right;   SKIN CANCER EXCISION     THROAT SURGERY  1998    throat cancer    TUBAL LIGATION     VENTRAL HERNIA REPAIR N/A 03/03/2017   Procedure: HERNIA REPAIR VENTRAL ADULT;  Surgeon: Ricarda Frame, MD;  Location: [facility];  Service: General;  Laterality: N/A;    Allergies: Sulfa antibiotics  Medications: Prior to Admission medications   Medication Sig Start Date End Date Taking? Authorizing Provider  albuterol (PROVENTIL) (2.5 MG/3ML) 0.083% nebulizer solution Take 3 mLs (2.5 mg total) by nebulization every 6 (six) hours as needed for wheezing or shortness of breath. 05/20/22   Jacky Kindle, FNP  albuterol (VENTOLIN HFA) 108 (90 Base) MCG/ACT inhaler Inhale 2 puffs into the lungs every 6 (six) hours as needed for shortness of breath. 01/29/21   Menshew, Charlesetta Ivory, PA-C  alum & mag hydroxide-simeth (MAALOX PLUS) 400-400-40 MG/5ML suspension Take 15 mLs by mouth every 6 (six) hours as needed for indigestion. 05/23/22   Jacky Kindle, FNP  aspirin EC 81 MG tablet Take 1 tablet (81 mg total) by mouth daily. Swallow whole. 12/08/21   Furth, Cadence H, PA-C  Budeson-Glycopyrrol-Formoterol (BREZTRI AEROSPHERE) 160-9-4.8 MCG/ACT AERO Inhale 2 puffs into the lungs in the morning and at bedtime. 12/15/21   Jacky Kindle, FNP  buPROPion ER [facility] SR) 100 MG 12 hr tablet Take 1 tablet (100 mg total) by mouth 2 (two) times daily. 10/12/22   Jacky Kindle, FNP  busPIRone (BUSPAR) 5 MG tablet Take 1 tablet (5 mg total) by mouth 3 (three) times daily. 10/12/22   Jacky Kindle, FNP  ciprofloxacin (CIPRO) 250 MG tablet Take 1 tablet (250 mg total) by mouth 2 (two) times daily. Patient not taking: Reported on 01/05/2023 11/28/22   Alfredo Martinez, MD  cyclobenzaprine (FLEXERIL) 5 MG tablet TAKE 1 TABLET BY MOUTH AT BEDTIME 12/14/22   Jacky Kindle, FNP  DULoxetine (CYMBALTA) 60 MG capsule Take 1 capsule (60 mg total) by mouth 2 (two) times daily. 10/12/22   Jacky Kindle, FNP  fluticasone (FLONASE) 50 MCG/ACT nasal spray Place 1 spray into both nostrils at  bedtime. 06/19/20   Margaretann Loveless, PA-C  gabapentin (NEURONTIN) 300 MG capsule TAKE 1 CAPSULE BY MOUTH 3 TIMES DAILY 10/11/22   Merita Norton T, FNP  meloxicam (MOBIC) 7.5 MG tablet TAKE 1 TABLET BY MOUTH DAILY 12/09/22   Erasmo Downer, MD  mirabegron ER (MYRBETRIQ) 25 MG TB24 tablet Take 25 mg by mouth. 2 tablets daily    [provider]  montelukast (SINGULAIR) 10 MG tablet Take 1 tablet (10 mg total) by mouth at bedtime. 07/14/22   Erasmo Downer, MD  omeprazole (PRILOSEC) 40 MG capsule TAKE 1 CAPSULE BY MOUTH DAILY 09/29/22   Merita Norton T, FNP  rOPINIRole (REQUIP) 0.25 MG tablet TAKE 1 TABLET BY MOUTH AT BEDTIME 08/04/22   Jacky Kindle, FNP  rosuvastatin (CRESTOR) 40 MG tablet Take 1 tablet (40 mg total) by mouth daily. 12/15/21   Jacky Kindle, FNP     Family History  Problem Relation Age of Onset   Diabetes Mother    Heart attack Mother    Cancer Sister        cancer of  eyes, spread to lung, other places. died at 17   Cancer Sister 77       unknown primary, spread to bone, brain died in 64's   Cancer Paternal Aunt        unk type   Cancer Paternal Grandmother        type unk   Prostate cancer Neg Hx    Kidney cancer Neg Hx    Bladder Cancer Neg Hx    Breast cancer Neg Hx     Social History   Socioeconomic History   Marital status: Married    Spouse name: Not on file   Number of children: 4   Years of education: Not on file   Highest education level: 10th grade  Occupational History   Occupation: retired  Tobacco Use   Smoking status: Former    Current packs/day: 0.00    Average packs/day: 1.5 packs/day for 30.0 years (45.0 ttl pk-yrs)    Types: Cigarettes    Start date: 05/02/1965    Quit date: 05/03/1995    Years since quitting: 27.6    Passive exposure: Past   Smokeless tobacco: Never  Vaping Use   Vaping status: Never Used  Substance and Sexual Activity   Alcohol use: Not Currently    Alcohol/week: 4.0 standard drinks of alcohol     Types: 4 Shots of liquor per week    Comment: none   Drug use: No   Sexual activity: Not Currently    Birth control/protection: Post-menopausal  Other Topics Concern   Not on file  Social History Narrative   Not on file   Social Determinants of Health   Financial Resource Strain: Low Risk  (12/02/2021)   Overall Financial Resource Strain (CARDIA)    Difficulty of Paying Living Expenses: Not very hard  Food Insecurity: No Food Insecurity (12/05/2022)   Hunger Vital Sign    Worried About Running Out of Food in the Last Year: Never true    Ran Out of Food in the Last Year: Never true  Transportation Needs: No Transportation Needs (12/05/2022)   PRAPARE - Administrator, Civil Service (Medical): No    Lack of Transportation (Non-Medical): No  Physical Activity: Insufficiently Active (12/05/2022)   Exercise Vital Sign    Days of Exercise per Week: 3 days    Minutes of Exercise per Session: 30 min  Stress: Stress Concern Present (12/05/2022)   [employer] of Occupational Health - Occupational Stress Questionnaire    Feeling of Stress : Rather much  Social Connections: Moderately Isolated (12/05/2022)   Social Connection and Isolation Panel [NHANES]    Frequency of Communication with Friends and Family: Three times a week    Frequency of Social Gatherings with Friends and Family: Twice a week    Attends Religious Services: Never    Database administrator or Organizations: No    Attends Engineer, structural: Never    Marital Status: Married    Code Status: Full code  Review of Systems: A 12 point ROS discussed and pertinent positives are indicated in the HPI above.  All other systems are negative.  Review of Systems  Constitutional:  Negative for chills and fever.  Respiratory:  Negative for chest tightness and shortness of breath.   Cardiovascular:  Negative for chest pain and leg swelling.  Gastrointestinal:  Negative for abdominal pain, diarrhea, nausea and  vomiting.  Neurological:  Positive for headaches. Negative for dizziness.  Psychiatric/Behavioral:  Negative for  confusion.     Vital Signs: BP (!) 137/94   Pulse 84   Temp 97.8 F (36.6 C)   Resp (!) 21   Ht 5\' 1"  (1.549 m)   Wt 132 lb (59.9 kg)   SpO2 99%   BMI 24.94 kg/m   Advance Care Plan: The advanced care plan/surrogate decision maker was discussed at the time of visit and documented in the medical record.  Patient identified her husband, Ronda Lubarsky, as Runner, broadcasting/film/video.  Physical Exam Vitals reviewed.  Constitutional:      General: She is not in acute distress. Cardiovascular:     Rate and Rhythm: Normal rate and regular rhythm.     Pulses: Normal pulses.     Heart sounds: Normal heart sounds.  Pulmonary:     Effort: Pulmonary effort is normal.     Breath sounds: Normal breath sounds.  Abdominal:     Palpations: Abdomen is soft.     Tenderness: There is no abdominal tenderness.  Musculoskeletal:     Right lower leg: No edema.     Left lower leg: No edema.  Skin:    General: Skin is warm and dry.  Neurological:     Mental Status: She is alert and oriented to person, place, and time.  Psychiatric:        Mood and Affect: Mood normal.        Behavior: Behavior normal.        Thought Content: Thought content normal.        Judgment: Judgment normal.     Imaging: NM PET Image Restag (PS) Skull Base To Thigh  Result Date: 12/26/2022 CLINICAL DATA:  Initial treatment strategy for breast cancer. EXAM: NUCLEAR MEDICINE PET SKULL BASE TO THIGH TECHNIQUE: 7.4 mCi F-18 FDG was injected intravenously. Full-ring PET imaging was performed from the skull base to thigh after the radiotracer. CT data was obtained and used for attenuation correction and anatomic localization. Fasting blood glucose: 85 mg/dl COMPARISON:  CT chest 91/47/8295, 05/13/2022, MR abdomen 02/04/2022. CT abdomen pelvis 12/09/2021. FINDINGS: Mediastinal blood pool activity: SUV max 2.4 Liver  activity: SUV max NA NECK: No abnormal hypermetabolism. Incidental CT findings: None. CHEST: Hypermetabolic mediastinal, subcarinal and left hilar adenopathy. Index subcarinal lymph node measures 5 mm, SUV max 6.5. 13 mm lingular nodule (4/53), SUV max 9.2. Additional scattered pulmonary nodules are not show abnormal hypermetabolism, some of which are too small for PET resolution. Mild hypermetabolism associated with postoperative scarring in right breast. Incidental CT findings: Atherosclerotic calcification of the aorta, aortic valve and coronary arteries. Heart is enlarged. No pericardial or pleural effusion. ABDOMEN/PELVIS: No abnormal hypermetabolism. Incidental CT findings: Liver, gallbladder, adrenal glands, kidneys, spleen, pancreas, stomach and bowel are grossly unremarkable. SKELETON: Focal hypermetabolism in the posterolateral right ninth rib, associated with a minimally displaced fracture, SUV max 6.3. No additional abnormal hypermetabolism. Incidental CT findings: None. IMPRESSION: 1. Intrathoracic nodal and pulmonary parenchymal metastatic disease. 2. Hypermetabolic right ninth rib fracture, worrisome for a metastasis. 3. Aortic atherosclerosis (ICD10-I70.0). Coronary artery calcification. Electronically Signed   By: Leanna Battles M.D.   On: 12/26/2022 08:56   CT Angio Chest Pulmonary Embolism (PE) W or WO Contrast  Result Date: 12/06/2022 CLINICAL DATA:  Chest wall pain, nontraumatic, malignancy known or suspected, xray done Patient with history of breast cancer. EXAM: CT ANGIOGRAPHY CHEST WITH CONTRAST TECHNIQUE: Multidetector CT imaging of the chest was performed using the standard protocol during bolus administration of intravenous contrast. Multiplanar CT image reconstructions  and MIPs were obtained to evaluate the vascular anatomy. RADIATION DOSE REDUCTION: This exam was performed according to the departmental dose-optimization program which includes automated exposure control, adjustment of  the mA and/or kV according to patient size and/or use of iterative reconstruction technique. CONTRAST:  75mL OMNIPAQUE IOHEXOL 350 MG/ML SOLN COMPARISON:  Rib radiographs earlier today. Chest CT 05/13/2022, prior choose C2 Zhou viewed. FINDINGS: Cardiovascular: There are no filling defects within the pulmonary arteries to suggest pulmonary embolus. Moderate aortic atherosclerosis. No evidence of acute aortic injury, contrast not tailored for aortic assessment. Stable heart size. No pericardial effusion. Mediastinum/Nodes: No mediastinal or hilar adenopathy. 7 mm left and 8 mm right hilar nodes are not enlarged by size criteria. Patulous esophagus. Lungs/Pleura: Stable 4 mm left apical nodule series 5, image 20. Left upper lobe nodule measuring 15 x 13 mm series 5, image 52, increasing in size and density from prior exam, previously 9 x 10 mm. Lobulated subpleural left lower lobe nodule measures 12 x 9 mm series 5, image 48, unchanged. Medial left lower lobe nodular density has slightly increased in size in density, 8 x 10 mm series 5, image 62. There are multiple additional tiny pulmonary nodules that are all new. For example subpleural right lower lobe nodule series 5, image 75, punctate right upper lobe series 5, image 48, and left upper lobe series 5, image 41. Vague areas of ground-glass in the perifissural right upper lobe series 5, image 66, similar to prior. Minimal apical predominant emphysema. No pleural fluid or pneumothorax. Upper Abdomen: Stable exophytic lesion from the upper left kidney, Hounsfield units of 23, likely a complex cyst. No specific imaging follow-up is needed. No acute upper abdominal findings. Musculoskeletal: There is a lucency within the right posterior ninth rib that is slightly linear in configuration, rib fracture versus focal lesion. The possibility of pathologic fracture is raised. No additional focal bone lesions are seen. Sequela of postsurgical change in the right breast.  Review of the MIP images confirms the above findings. IMPRESSION: 1. No pulmonary embolus. 2. Multiple bilateral pulmonary nodules. The largest nodule in the left upper lobe has increased in size and density from prior exam, now measuring 15 x 13 mm. There are multiple additional tiny pulmonary nodules that are all new. Findings are suspicious for metastatic disease. 3. Linear lucency within the right posterior ninth rib, rib fracture versus focal lesion. The possibility of pathologic fracture is raised. 4. Emphysema. Aortic Atherosclerosis (ICD10-I70.0) and Emphysema (ICD10-J43.9). Electronically Signed   By: Narda Rutherford M.D.   On: 12/06/2022 21:13   DG Ribs Unilateral W/Chest Right  Result Date: 12/06/2022 CLINICAL DATA:  Right chest wall pain EXAM: RIGHT RIBS AND CHEST - 3+ VIEW COMPARISON:  CT chest dated 05/13/2022. Chest radiograph dated 11/17/2021. FINDINGS: Two dominant left lung nodules, similar to the prior chest radiograph, but warranting follow-up CT when correlating with prior CT report. Right lung is essentially clear. No pleural effusion or pneumothorax. Heart is normal in size.  Mild thoracic aortic atherosclerosis. No displaced right rib fracture is seen. Surgical clips in the right chest wall/breast. IMPRESSION: No displaced right rib fracture is seen. Two dominant left lung nodules, grossly unchanged. However, repeat CT chest is suggested for further evaluation. Electronically Signed   By: Charline Bills M.D.   On: 12/06/2022 19:10    Labs:  CBC: Recent Labs    03/16/22 0942 09/14/22 1113 12/06/22 1931  WBC 4.6 4.2 5.5  HGB 13.8 13.4 14.0  HCT 42.9 41.9  41.6  PLT 179 166 165    COAGS: No results for input(s): "INR", "APTT" in the last 8760 hours.  BMP: Recent Labs    03/16/22 0942 12/06/22 1931  NA 137 139  K 4.8 4.0  CL 100 104  CO2 29 26  GLUCOSE 95 97  BUN 17 14  CALCIUM 9.1 9.3  CREATININE 0.73 0.70  GFRNONAA >60 >60    LIVER FUNCTION  TESTS: Recent Labs    03/16/22 0942 12/06/22 1931  BILITOT 0.6 0.5  AST 22 24  ALT 17 16  ALKPHOS 59 63  PROT 6.6 6.5  ALBUMIN 3.8 4.0    TUMOR MARKERS: No results for input(s): "AFPTM", "CEA", "CA199", "CHROMGRNA" in the last 8760 hours.  Assessment and Plan:  Denise Watts is a 67 yo female being seen today in relation to lung nodules. Patient is under the care of Dr Smith Robert from Oncology service who has referred patient to IR for image-guided lung mass biopsy. Patient's imaging was reviewed and approved by Dr Juliette Alcide. Patient presents today in her usual state of health and is NPO. Pre-procedural labs are currently pending.  Risks and benefits of CT guided lung nodule biopsy was discussed with the patient including, but not limited to bleeding, hemoptysis, respiratory failure requiring intubation, infection, pneumothorax requiring chest tube placement, stroke from air embolism or even death.  All of the patient's questions were answered and the patient is agreeable to proceed.  Consent signed and in chart.   Thank you for this interesting consult.  I greatly enjoyed meeting NIGEL YONG and look forward to participating in their care.  A copy of this report was sent to the requesting provider on this date.  Electronically Signed: Kennieth Francois, PA-C 01/06/2023, 8:23 AM   I spent a total of  15 Minutes   in face to face in clinical consultation, greater than 50% of which was counseling/coordinating care for lung nodules.

## 2023-01-06 ENCOUNTER — Ambulatory Visit
Admission: RE | Admit: 2023-01-06 | Discharge: 2023-01-06 | Disposition: A | Discharge status: Home / Self Care | Payer: Medicare HMO | Source: Ambulatory Visit | Attending: Student

## 2023-01-06 ENCOUNTER — Ambulatory Visit
Admission: RE | Admit: 2023-01-06 | Discharge: 2023-01-06 | Disposition: A | Discharge status: Home / Self Care | Payer: Medicare HMO | Source: Ambulatory Visit | Attending: Oncology | Admitting: Oncology

## 2023-01-06 ENCOUNTER — Encounter: Payer: Self-pay | Admitting: Gastroenterology

## 2023-01-06 ENCOUNTER — Other Ambulatory Visit: Payer: Self-pay

## 2023-01-06 ENCOUNTER — Other Ambulatory Visit: Payer: Self-pay | Admitting: *Deleted

## 2023-01-06 DIAGNOSIS — R079 Chest pain, unspecified: Secondary | ICD-10-CM | POA: Diagnosis not present

## 2023-01-06 DIAGNOSIS — C50511 Malignant neoplasm of lower-outer quadrant of right female breast: Secondary | ICD-10-CM | POA: Diagnosis not present

## 2023-01-06 DIAGNOSIS — R042 Hemoptysis: Secondary | ICD-10-CM | POA: Insufficient documentation

## 2023-01-06 DIAGNOSIS — R918 Other nonspecific abnormal finding of lung field: Secondary | ICD-10-CM | POA: Insufficient documentation

## 2023-01-06 DIAGNOSIS — Z171 Estrogen receptor negative status [ER-]: Secondary | ICD-10-CM | POA: Diagnosis not present

## 2023-01-06 DIAGNOSIS — R911 Solitary pulmonary nodule: Secondary | ICD-10-CM | POA: Diagnosis not present

## 2023-01-06 DIAGNOSIS — Z48813 Encounter for surgical aftercare following surgery on the respiratory system: Secondary | ICD-10-CM | POA: Diagnosis not present

## 2023-01-06 DIAGNOSIS — Z87891 Personal history of nicotine dependence: Secondary | ICD-10-CM | POA: Diagnosis not present

## 2023-01-06 DIAGNOSIS — Z01812 Encounter for preprocedural laboratory examination: Secondary | ICD-10-CM

## 2023-01-06 DIAGNOSIS — J9811 Atelectasis: Secondary | ICD-10-CM | POA: Diagnosis not present

## 2023-01-06 DIAGNOSIS — R0489 Hemorrhage from other sites in respiratory passages: Secondary | ICD-10-CM | POA: Diagnosis not present

## 2023-01-06 LAB — CBC
HCT: 42.4 % (ref 36.0–46.0)
Hemoglobin: 14 g/dL (ref 12.0–15.0)
MCH: 32.2 pg (ref 26.0–34.0)
MCHC: 33 g/dL (ref 30.0–36.0)
MCV: 97.5 fL (ref 80.0–100.0)
Platelets: 191 10*3/uL (ref 150–400)
RBC: 4.35 MIL/uL (ref 3.87–5.11)
RDW: 12.1 % (ref 11.5–15.5)
WBC: 5.8 10*3/uL (ref 4.0–10.5)
nRBC: 0 % (ref 0.0–0.2)

## 2023-01-06 LAB — PROTIME-INR
INR: 1 (ref 0.8–1.2)
Prothrombin Time: 13.9 s (ref 11.4–15.2)

## 2023-01-06 MED ORDER — OXYCODONE HCL 5 MG PO TABS
5.0000 mg | ORAL_TABLET | Freq: Four times a day (QID) | ORAL | 0 refills | Status: DC | PRN
Start: 2023-01-06 — End: 2023-02-03

## 2023-01-06 MED ORDER — HYDROMORPHONE HCL 1 MG/ML IJ SOLN: 0.5000 mg | Freq: Once | INTRAMUSCULAR | Status: AC

## 2023-01-06 MED ORDER — HYDROMORPHONE HCL 1 MG/ML IJ SOLN
INTRAMUSCULAR | Status: AC
  Administered 2023-01-06: 0.5 mg via INTRAVENOUS
  Filled 2023-01-06: qty 0.5

## 2023-01-06 MED ORDER — MIDAZOLAM HCL 2 MG/2ML IJ SOLN
INTRAMUSCULAR | Status: AC | PRN
  Administered 2023-01-06 (×2): 1 mg via INTRAVENOUS

## 2023-01-06 MED ORDER — FENTANYL CITRATE (PF) 100 MCG/2ML IJ SOLN
INTRAMUSCULAR | Status: AC | PRN
Start: 2023-01-06 — End: 2023-01-06
  Administered 2023-01-06 (×2): 50 ug via INTRAVENOUS

## 2023-01-06 MED ORDER — LIDOCAINE 1 % OPTIME INJ - NO CHARGE
10.0000 mL | Freq: Once | INTRAMUSCULAR | Status: DC
  Filled 2023-01-06: qty 10

## 2023-01-06 MED ORDER — FENTANYL CITRATE (PF) 100 MCG/2ML IJ SOLN
INTRAMUSCULAR | Status: AC
  Filled 2023-01-06: qty 2

## 2023-01-06 MED ORDER — MIDAZOLAM HCL 2 MG/2ML IJ SOLN
INTRAMUSCULAR | Status: AC
  Filled 2023-01-06: qty 4

## 2023-01-06 MED ORDER — SODIUM CHLORIDE 0.9 % IV SOLN: INTRAVENOUS | Status: DC

## 2023-01-06 NOTE — Progress Notes (Addendum)
PT was c/o 5/10 pain in left shoulder. Made MD aware , stated it was okay to offer pt tylenol. PT declined. After going to the restroom, pain increased and pt requested "something stronger". Per Dr. Payton Mccallum Abd's orders, gave pt 0.5mg  dilaudid via IV. Rehooked pt to monitor. VSS, dressing site stable. Will continue to monitor.  Update: PT received CXR which was reviewed by Dr. Juliette Alcide, Dr. Juliette Alcide then came to bedside to assess and speak with pt. OK to d/c home per MD.

## 2023-01-06 NOTE — Procedures (Signed)
Interventional Radiology Procedure Note  Date of Procedure: 01/06/2023  Procedure: CT lung biopsy   Findings:  1. CT right lung biopsy 18ga x4 passes, only minimal tissue obtained before procedure aborted due to bleeding and pt coughing blood    Complications: No immediate complications noted.   Estimated Blood Loss: minimal  Follow-up and Recommendations: 1. Bedrest 4 hours    Olive Bass, MD  Vascular & Interventional Radiology  01/06/2023 10:14 AM
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
## 2023-01-09 ENCOUNTER — Inpatient Hospital Stay: Payer: Medicare HMO | Attending: Oncology

## 2023-01-09 DIAGNOSIS — Z85828 Personal history of other malignant neoplasm of skin: Secondary | ICD-10-CM | POA: Insufficient documentation

## 2023-01-09 DIAGNOSIS — Z853 Personal history of malignant neoplasm of breast: Secondary | ICD-10-CM | POA: Insufficient documentation

## 2023-01-09 DIAGNOSIS — Z8521 Personal history of malignant neoplasm of larynx: Secondary | ICD-10-CM | POA: Insufficient documentation

## 2023-01-09 DIAGNOSIS — Z87891 Personal history of nicotine dependence: Secondary | ICD-10-CM | POA: Insufficient documentation

## 2023-01-09 DIAGNOSIS — Z923 Personal history of irradiation: Secondary | ICD-10-CM | POA: Insufficient documentation

## 2023-01-09 DIAGNOSIS — Z9221 Personal history of antineoplastic chemotherapy: Secondary | ICD-10-CM | POA: Insufficient documentation

## 2023-01-09 DIAGNOSIS — R942 Abnormal results of pulmonary function studies: Secondary | ICD-10-CM | POA: Insufficient documentation

## 2023-01-09 DIAGNOSIS — D519 Vitamin B12 deficiency anemia, unspecified: Secondary | ICD-10-CM | POA: Insufficient documentation

## 2023-01-09 DIAGNOSIS — R590 Localized enlarged lymph nodes: Secondary | ICD-10-CM | POA: Insufficient documentation

## 2023-01-09 NOTE — Group Note (Deleted)
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
## 2023-01-10 NOTE — Op Note (Signed)
[facility] Gastroenterology Patient Name: Denise Watts Procedure Date: 01/05/2023 10:17 AM MRN: [id number] Account #: [id number] Date of Birth: 07/15/1955 Admit Type: Outpatient Age: 67 Room: [facility] ENDO ROOM 2 Gender: Female Note Status: Finalized Instrument Name: Prentice Docker [id number] Procedure:             Colonoscopy Indications:           High risk colon cancer surveillance: Personal history                         of colonic polyps, Surveillance: Personal history of                         adenomatous polyps on last colonoscopy 3 years ago,                         Last colonoscopy: June 2021 Providers:             Toney Reil MD, MD Referring MD:          Daryl Eastern. Suzie Portela (Referring MD) Medicines:             General Anesthesia Complications:         No immediate complications. Estimated blood loss: None. Procedure:             Pre-Anesthesia Assessment:                        - Prior to the procedure, a History and Physical was                         performed, and patient medications and allergies were                         reviewed. The patient is competent. The risks and                         benefits of the procedure and the sedation options and                         risks were discussed with the patient. All questions                         were answered and informed consent was obtained.                         Patient identification and proposed procedure were                         verified by the physician, the nurse, the                         anesthesiologist, the anesthetist and the technician                         in the pre-procedure area in the procedure room in the                         endoscopy suite. Mental Status Examination: alert and  oriented. Airway Examination: normal oropharyngeal                         airway and neck mobility. Respiratory Examination:                         clear to  auscultation. CV Examination: normal.                         Prophylactic Antibiotics: The patient does not require                         prophylactic antibiotics. Prior Anticoagulants: The                         patient has taken no anticoagulant or antiplatelet                         agents. ASA Grade Assessment: III - A patient with                         severe systemic disease. After reviewing the risks and                         benefits, the patient was deemed in satisfactory                         condition to undergo the procedure. The anesthesia                         plan was to use general anesthesia. Immediately prior                         to administration of medications, the patient was                         re-assessed for adequacy to receive sedatives. The                         heart rate, respiratory rate, oxygen saturations,                         blood pressure, adequacy of pulmonary ventilation, and                         response to care were monitored throughout the                         procedure. The physical status of the patient was                         re-assessed after the procedure.                        After obtaining informed consent, the colonoscope was                         passed under direct vision. Throughout the procedure,  the patient's blood pressure, pulse, and oxygen                         saturations were monitored continuously. The                         Colonoscope was introduced through the anus and                         advanced to the the cecum, identified by appendiceal                         orifice and ileocecal valve. The colonoscopy was                         performed with moderate difficulty due to multiple                         diverticula in the colon and a redundant colon.                         Successful completion of the procedure was aided by                          applying abdominal pressure. The patient tolerated the                         procedure well. The quality of the bowel preparation                         was evaluated using the BBPS [facility] Bowel Preparation                         Scale) with scores of: Right Colon = 3, Transverse                         Colon = 3 and Left Colon = 3 (entire mucosa seen well                         with no residual staining, small fragments of stool or                         opaque liquid). The total BBPS score equals 9. The                         ileocecal valve, appendiceal orifice, and rectum were                         photographed. Findings:      Skin tags were found on perianal exam.      Five sessile polyps were found in the descending colon, transverse colon       and ascending colon. The polyps were 4 to 6 mm in size. These polyps       were removed with a cold snare. Resection and retrieval were complete.      Multiple large-mouthed diverticula were found in the recto-sigmoid colon       and sigmoid  colon.      Non-bleeding external hemorrhoids were found during retroflexion. The       hemorrhoids were large. Impression:            - Perianal skin tags found on perianal exam.                        - Five 4 to 6 mm polyps in the descending colon, in                         the transverse colon and in the ascending colon,                         removed with a cold snare. Resected and retrieved.                        - Diverticulosis in the recto-sigmoid colon and in the                         sigmoid colon.                        - Non-bleeding external hemorrhoids. Recommendation:        - Discharge patient to home (with escort).                        - Resume previous diet today.                        - Continue present medications.                        - Await pathology results.                        - Repeat colonoscopy in 3 - 5 years for surveillance                          based on pathology results. Procedure Code(s):     --- Professional ---                        [phone], Colonoscopy, flexible; with removal of                         tumor(s), polyp(s), or other lesion(s) by snare                         technique Diagnosis Code(s):     --- Professional ---                        D12.4, Benign neoplasm of descending colon                        D12.3, Benign neoplasm of transverse colon (hepatic                         flexure or splenic flexure)                        D12.2,  Benign neoplasm of ascending colon                        K64.4, Residual hemorrhoidal skin tags                        K57.30, Diverticulosis of large intestine without                         perforation or abscess without bleeding                        Z86.010, Personal history of colonic polyps CPT copyright 2022 American Medical Association. All rights reserved. The codes documented in this report are preliminary and upon coder review may  be revised to meet current compliance requirements. Dr. Libby Maw Toney Reil MD, MD 01/05/2023 11:45:58 AM This report has been signed electronically. Number of Addenda: 0 Note Initiated On: 01/05/2023 10:17 AM Scope Withdrawal Time: 0 hours 12 minutes 57 seconds  Total Procedure Duration: 0 hours 21 minutes 7 seconds  Estimated Blood Loss:  Estimated blood loss: none.      [facility]

## 2023-01-10 NOTE — Op Note (Signed)
[facility] Gastroenterology Patient Name: Denise Watts Procedure Date: 01/05/2023 10:31 AM MRN: [id number] Account #: [id number] Date of Birth: 1955-12-23 Admit Type: Outpatient Age: 67 Room: 2 Gender: Female Note Status: Finalized Instrument Name: Upper Endoscope [id number] Procedure:             Upper GI endoscopy Indications:           Esophageal dysphagia Providers:             Toney Reil MD, MD Referring MD:          Daryl Eastern. Suzie Portela (Referring MD) Medicines:             General Anesthesia Complications:         No immediate complications. Estimated blood loss:                         Minimal. Procedure:             Pre-Anesthesia Assessment:                        - Prior to the procedure, a History and Physical was                         performed, and patient medications and allergies were                         reviewed. The patient is competent. The risks and                         benefits of the procedure and the sedation options and                         risks were discussed with the patient. All questions                         were answered and informed consent was obtained.                         Patient identification and proposed procedure were                         verified by the physician, the nurse, the                         anesthesiologist, the anesthetist and the technician                         in the pre-procedure area in the procedure room in the                         endoscopy suite. Mental Status Examination: alert and                         oriented. Airway Examination: normal oropharyngeal                         airway and neck mobility. Respiratory Examination:  clear to auscultation. CV Examination: normal.                         Prophylactic Antibiotics: The patient does not require                         prophylactic antibiotics. Prior Anticoagulants: The                          patient has taken no anticoagulant or antiplatelet                         agents. ASA Grade Assessment: III - A patient with                         severe systemic disease. After reviewing the risks and                         benefits, the patient was deemed in satisfactory                         condition to undergo the procedure. The anesthesia                         plan was to use general anesthesia. Immediately prior                         to administration of medications, the patient was                         re-assessed for adequacy to receive sedatives. The                         heart rate, respiratory rate, oxygen saturations,                         blood pressure, adequacy of pulmonary ventilation, and                         response to care were monitored throughout the                         procedure. The physical status of the patient was                         re-assessed after the procedure.                        After obtaining informed consent, the endoscope was                         passed under direct vision. Throughout the procedure,                         the patient's blood pressure, pulse, and oxygen                         saturations were monitored continuously. The Endoscope  was introduced through the mouth, and advanced to the                         second part of duodenum. The upper GI endoscopy was                         accomplished without difficulty. The patient tolerated                         the procedure well. Findings:      The duodenal bulb and second portion of the duodenum were normal.      Diffuse moderately erythematous mucosa without bleeding was found in the       gastric body and in the gastric antrum. Biopsies were taken with a cold       forceps for histology.      Evidence of a Nissen fundoplication was found in the cardia. The wrap       appeared loose. This was traversed.      Esophagogastric  landmarks were identified: the gastroesophageal junction       was found at 33 cm from the incisors.      The gastroesophageal junction and examined esophagus were normal. A TTS       dilator was passed through the scope. Dilation with a 02-10-11 mm       balloon and a 12-13.5-15 mm balloon dilator was performed to 15 mm. The       dilation site was examined following endoscope reinsertion and showed       mild mucosal disruption. Estimated blood loss was minimal.      The examined esophagus was normal. Impression:            - Normal duodenal bulb and second portion of the                         duodenum.                        - Erythematous mucosa in the gastric body and antrum.                         Biopsied.                        - A Nissen fundoplication was found. The wrap appears                         loose.                        - Esophagogastric landmarks identified.                        - Normal gastroesophageal junction and esophagus.                         Dilated.                        - Normal esophagus. Recommendation:        - Follow an antireflux regimen for the rest of the  patient's life.                        - Use a proton pump inhibitor PO daily indefinitely.                        - Proceed with colonoscopy as scheduled                        See colonoscopy report Procedure Code(s):     --- Professional ---                        [phone], Esophagogastroduodenoscopy, flexible,                         transoral; with transendoscopic balloon dilation of                         esophagus (less than 30 mm diameter)                        [id number], 59, Esophagogastroduodenoscopy, flexible,                         transoral; with biopsy, single or multiple Diagnosis Code(s):     --- Professional ---                        K31.89, Other diseases of stomach and duodenum                        Z98.890, Other specified postprocedural states                         R13.14, Dysphagia, pharyngoesophageal phase CPT copyright 2022 American Medical Association. All rights reserved. The codes documented in this report are preliminary and upon coder review may  be revised to meet current compliance requirements. Dr. Libby Maw Toney Reil MD, MD 01/05/2023 11:21:24 AM This report has been signed electronically. Number of Addenda: 0 Note Initiated On: 01/05/2023 10:31 AM Estimated Blood Loss:  Estimated blood loss was minimal.      [facility]

## 2023-01-13 ENCOUNTER — Ambulatory Visit: Payer: Self-pay

## 2023-01-13 NOTE — Telephone Encounter (Signed)
Chief Complaint: Cough w/green phlegm Symptoms: Real bad today, light wheezing, chest a little tight Frequency: Ongoing since before last OV Pertinent Negatives: Patient denies other symptoms (fever) Disposition: [] ED /[] Urgent Care (no appt availability in office) / [x] Appointment(In office/virtual)/ []  [facility]/ [] Home Care/ [] Refused Recommended Disposition /[] [facility]/ []  Follow-up with PCP Additional Notes: N/A   Summary: medication request   Patient called stated she is coughing up green mucus and need a medication called in. Please f/u with patient     Reason for Disposition  Cough has been present for > 3 weeks  Answer Assessment - Initial Assessment Questions 1. ONSET: "When did the cough begin?"      Non-stop 2. SEVERITY: "How bad is the cough today?"      Real bad today 3. SPUTUM: "Describe the color of your sputum" (none, dry cough; clear, white, yellow, green)     Green 4. HEMOPTYSIS: "Are you coughing up any blood?" If so ask: "How much?" (flecks, streaks, tablespoons, etc.)     No 5. DIFFICULTY BREATHING: "Are you having difficulty breathing?" If Yes, ask: "How bad is it?" (e.g., mild, moderate, severe)    - MILD: No SOB at rest, mild SOB with walking, speaks normally in sentences, can lie down, no retractions, pulse < 100.    - MODERATE: SOB at rest, SOB with minimal exertion and prefers to sit, cannot lie down flat, speaks in phrases, mild retractions, audible wheezing, pulse 100-120.    - SEVERE: Very SOB at rest, speaks in single words, struggling to breathe, sitting hunched forward, retractions, pulse > 120      Mild 6. FEVER: "Do you have a fever?" If Yes, ask: "What is your temperature, how was it measured, and when did it start?"     No 7. CARDIAC HISTORY: "Do you have any history of heart disease?" (e.g., heart attack, congestive heart failure)      No 8. LUNG HISTORY: "Do you have any history of lung disease?"  (e.g., pulmonary  embolus, asthma, emphysema)     COPD, asthma 9. PE RISK FACTORS: "Do you have a history of blood clots?" (or: recent major surgery, recent prolonged travel, bedridden)     No 10. OTHER SYMPTOMS: "Do you have any other symptoms?" (e.g., runny nose, wheezing, chest pain)       Chest tight, little wheezing  Protocols used: Cough - Acute Non-Productive-A-AH

## 2023-01-16 ENCOUNTER — Inpatient Hospital Stay: Payer: Medicare HMO

## 2023-01-16 ENCOUNTER — Ambulatory Visit: Payer: Medicare HMO | Admitting: Urology

## 2023-01-16 ENCOUNTER — Encounter: Payer: Self-pay | Admitting: Oncology

## 2023-01-16 ENCOUNTER — Inpatient Hospital Stay (HOSPITAL_BASED_OUTPATIENT_CLINIC_OR_DEPARTMENT_OTHER): Payer: Medicare HMO | Admitting: Oncology

## 2023-01-16 ENCOUNTER — Ambulatory Visit (INDEPENDENT_AMBULATORY_CARE_PROVIDER_SITE_OTHER): Payer: Medicare HMO | Admitting: Family Medicine

## 2023-01-16 ENCOUNTER — Encounter: Payer: Self-pay | Admitting: Family Medicine

## 2023-01-16 VITALS — BP 104/74 | HR 90 | Ht 61.0 in | Wt 135.6 lb

## 2023-01-16 VITALS — BP 98/51 | HR 92 | Temp 98.6°F | Resp 17 | Wt 136.4 lb

## 2023-01-16 DIAGNOSIS — Z171 Estrogen receptor negative status [ER-]: Secondary | ICD-10-CM

## 2023-01-16 DIAGNOSIS — Z853 Personal history of malignant neoplasm of breast: Secondary | ICD-10-CM | POA: Diagnosis not present

## 2023-01-16 DIAGNOSIS — R4589 Other symptoms and signs involving emotional state: Secondary | ICD-10-CM

## 2023-01-16 DIAGNOSIS — R590 Localized enlarged lymph nodes: Secondary | ICD-10-CM | POA: Diagnosis not present

## 2023-01-16 DIAGNOSIS — Z8521 Personal history of malignant neoplasm of larynx: Secondary | ICD-10-CM | POA: Diagnosis not present

## 2023-01-16 DIAGNOSIS — C50511 Malignant neoplasm of lower-outer quadrant of right female breast: Secondary | ICD-10-CM

## 2023-01-16 DIAGNOSIS — D519 Vitamin B12 deficiency anemia, unspecified: Secondary | ICD-10-CM | POA: Diagnosis not present

## 2023-01-16 DIAGNOSIS — R053 Chronic cough: Secondary | ICD-10-CM | POA: Diagnosis not present

## 2023-01-16 DIAGNOSIS — Z923 Personal history of irradiation: Secondary | ICD-10-CM | POA: Diagnosis not present

## 2023-01-16 DIAGNOSIS — Z23 Encounter for immunization: Secondary | ICD-10-CM

## 2023-01-16 DIAGNOSIS — F322 Major depressive disorder, single episode, severe without psychotic features: Secondary | ICD-10-CM | POA: Diagnosis not present

## 2023-01-16 DIAGNOSIS — R0989 Other specified symptoms and signs involving the circulatory and respiratory systems: Secondary | ICD-10-CM | POA: Insufficient documentation

## 2023-01-16 DIAGNOSIS — R942 Abnormal results of pulmonary function studies: Secondary | ICD-10-CM | POA: Diagnosis not present

## 2023-01-16 DIAGNOSIS — Z9221 Personal history of antineoplastic chemotherapy: Secondary | ICD-10-CM | POA: Diagnosis not present

## 2023-01-16 DIAGNOSIS — Z85828 Personal history of other malignant neoplasm of skin: Secondary | ICD-10-CM | POA: Diagnosis not present

## 2023-01-16 DIAGNOSIS — E538 Deficiency of other specified B group vitamins: Secondary | ICD-10-CM

## 2023-01-16 DIAGNOSIS — Z87891 Personal history of nicotine dependence: Secondary | ICD-10-CM | POA: Diagnosis not present

## 2023-01-16 LAB — POC COVID19 BINAXNOW: SARS Coronavirus 2 Ag: NEGATIVE

## 2023-01-16 MED ORDER — BUPROPION HCL ER (XL) 150 MG PO TB24
450.0000 mg | ORAL_TABLET | Freq: Every day | ORAL | 1 refills | Status: DC
Start: 2023-01-16 — End: 2023-05-07

## 2023-01-16 MED ORDER — CYANOCOBALAMIN 1000 MCG/ML IJ SOLN
1000.0000 ug | INTRAMUSCULAR | Status: DC
  Administered 2023-01-16: 1000 ug via INTRAMUSCULAR
  Filled 2023-01-16: qty 1

## 2023-01-16 NOTE — Patient Instructions (Signed)
Some things that can make you feel better are: - Increased rest - Increasing Fluids - Acetaminophen / ibuprofen as needed for fever/pain.  - Salt water gargling, chloraseptic spray and throat lozenges - OTC pseudoephedrine.  - Mucinex.  - Saline sinus flushes or a neti pot.  - Humidifying the air.

## 2023-01-16 NOTE — Assessment & Plan Note (Signed)
Chronic, worsening Plan to start cancer treatment this afternoon; complaints of cough since EBUS

## 2023-01-16 NOTE — Assessment & Plan Note (Signed)
Pt unable to decipher acute from chronic; notes worse symptoms in past 3 weeks Denies fevers, chills Pt is well appearing outside of anxiety POC COVID checked given concern for treatment start today- negative

## 2023-01-16 NOTE — Assessment & Plan Note (Signed)
Will increase wellbutrin to 450 mg at this time given increased symptoms with recurrent cancer diagnoses and treatment Mets noted to L ribs s/p fracture with known lung nodules as well Appt this PM for treatment

## 2023-01-16 NOTE — Progress Notes (Signed)
Hematology/Oncology Consult note [facility]  Telephone:([phone] [phone]  Patient Care Team: Jacky Kindle, FNP as PCP - General (Family Medicine) Debbe Odea, MD as PCP - Cardiology (Cardiology) Creig Hines, MD as Consulting Physician (Oncology) Leafy Ro, MD as Consulting Physician (General Surgery) Dasher, Cliffton Asters, MD (Dermatology) Scheeler, Kermit Balo, PA-C as Physician Assistant (Plastic Surgery) Minna Antis, MD as Attending Physician (Emergency Medicine) Alvira Philips Leitha Bleak (Neurology) Dayna Barker, MD as Referring Physician (Internal Medicine) Coralyn Helling, MD as Consulting Physician (Pulmonary Disease) Gaspar Cola, RPH (Inactive) (Pharmacist) [facility])   Name of the patient: Camiryn Schroen  [id number]  August 21, 1955   Date of visit: 01/16/23  Diagnosis-  1. H/o breast cancer in 1997 2. H/o stage I SCC of vocal cord in 2015. 3. Lung nodules 4. Adrenal carcinoma of unknown primary s/p resection 5. Stage IaT1a NxcM0 ER weakly positive 1-10%, PR negative and her 2 negative   Chief complaint/ Reason for visit-discuss PET scan results and further management  Heme/Onc history:  Oncology History Overview Note  1. Carcinoma of breast, T1, N0, M0 tumor diagnosis.  In January of 1997 2. Carcinoma of the vocal cord T1, N0, M0 tumor.  Status postradiation therapy 3. Abnormal CT scan of the chest (December, 2015) 4. Repeat CT scan of chest shows stable nodule (July, 2016) 5. Neutropenia with normal hemoglobin and normal platelet count (July, 2016)   Cancer of vocal cord [facility]) (Resolved)  11/10/2014 Initial Diagnosis   Cancer of vocal cord   Malignant neoplasm of lower-outer quadrant of right breast of female, estrogen receptor negative (HCC)  11/24/2017 Initial Diagnosis   Malignant neoplasm of lower-outer quadrant of right breast of female, estrogen receptor negative (HCC)    01/12/2018 Cancer Staging   Staging form: Breast, AJCC 8th Edition - Clinical stage from 01/12/2018: Stage IB (cT1a, cN0, cM0, G3, ER+, PR-, HER2-) - Signed by Creig Hines, MD on 01/12/2018   01/30/2018 - 04/26/2018 Chemotherapy   The patient had dexamethasone (DECADRON) 4 MG tablet, 8 mg, Oral, 2 times daily, 1 of 1 cycle, Start date: 01/12/2018, End date: 08/03/2018 palonosetron (ALOXI) injection 0.25 mg, 0.25 mg, Intravenous,  Once, 4 of 4 cycles Administration: 0.25 mg (01/30/2018), 0.25 mg (02/27/2018), 0.25 mg (03/28/2018), 0.25 mg (04/26/2018) pegfilgrastim (NEULASTA ONPRO KIT) injection 6 mg, 6 mg, Subcutaneous, Once, 4 of 4 cycles Administration: 6 mg (01/30/2018), 6 mg (02/27/2018), 6 mg (03/28/2018), 6 mg (04/26/2018) cyclophosphamide (CYTOXAN) 1,000 mg in sodium chloride 0.9 % 250 mL chemo infusion, 600 mg/m2 = 1,000 mg, Intravenous,  Once, 4 of 4 cycles Administration: 1,000 mg (01/30/2018), 1,000 mg (02/27/2018), 1,000 mg (03/28/2018), 1,000 mg (04/26/2018) DOCEtaxel (TAXOTERE) 130 mg in sodium chloride 0.9 % 250 mL chemo infusion, 75 mg/m2 = 130 mg, Intravenous,  Once, 4 of 4 cycles Dose modification: 60 mg/m2 (original dose 75 mg/m2, Cycle 2, Reason: Dose not tolerated) Administration: 130 mg (01/30/2018), 100 mg (02/27/2018), 100 mg (03/28/2018), 100 mg (04/26/2018)  for chemotherapy treatment.     The patient was originally diagnosed with right breast cancer in 1997, 10 mm grade 3, ER pr positive her 2 negative, node negative status post surgery and radiation treatment only. She never received chemotherapy or hormone therapy. For her vocal cord cancer, she had extensive surgery and radiation therapy in 1998. Due to past history of smoking, she was also noted to have lung nodules. She is undergoing active surveillance imaging study of  the chest.   Patient noted to have adrenal mass on CT chest which prompted PET CT in July 2018 which showed: IMPRESSION: 1. Right adrenal mass with  peripheral hypermetabolism. Given interval development since 11/18/2015, suspicious for either isolated metastasis or a primary adrenal neoplasm. This should be considered for biopsy. 2. No evidence of hypermetabolic primary malignancy or extraadrenal metastasis. Pulmonary nodules are not significantly hypermetabolic. 3. Coronary artery atherosclerosis. Aortic Atherosclerosis (ICD10-I70.0). 4. Hepatic steatosis. 5. Left sixth rib hypermetabolism is favored to be related to remote Trauma.   Patient underwent right adrenalectomy which showed: DIAGNOSIS:  A. ADRENAL GLAND, RIGHT; ADRENALECTOMY:  - HIGH GRADE CARCINOMA WITH EXTENSIVE NECROSIS.  - MARGINS OF THE SPECIMEN ARE NEGATIVE FOR CARCINOMA.   Comment:  Sections demonstrate abundant necrosis confined to the adrenal medullary  compartment. In one block of tissue a small fragment of viable malignant  neoplasm was identified. A panel of immunohistochemical stains was  performed with the following pattern of immunoreactivity:  Super pancytokeratin: Positive  GATA-3: Negative  TTF-1: Negative  P40: Negative  Napsin: Negative  PAX-8: Negative  Chromogranin: Negative     Repeat CT chest abdomen and pelvis in July 2019 showed stable left lower lobe pulmonary nodule.  No other evidence of metastatic disease.  New 4.5 mm nodule in the right breast.  This was followed by ultrasound mammogram and core biopsy of the breast lesion which was a grade 3 triple negative breast cancer.  Patient was seen by Dr. Lemar Livings and underwent lumpectomy on 12/08/2017.   Patient had a 5 mm weakly ER positive tumor.  Given her ongoing fatigue Oncotype testing was done to see if she could potentially avoid chemotherapy.  Oncotype shows a recurrence score of 53 with her risk of distant recurrence at 9 years at greater than 39% with hormone therapy alone.  Absolute benefit of chemotherapy was greater than 15% in her age group.  Patient completed 4 cycles of adjuvant TC  chemotherapy in December 2019      Interval history-right sided chest wall pain is getting better.  She still reports ongoing fatigue  ECOG PS- 2 Pain scale- 3 Opioid associated constipation- no  Review of systems- Review of Systems  Constitutional:  Positive for malaise/fatigue. Negative for chills, fever and weight loss.  HENT:  Negative for congestion, ear discharge and nosebleeds.   Eyes:  Negative for blurred vision.  Respiratory:  Negative for cough, hemoptysis, sputum production, shortness of breath and wheezing.   Cardiovascular:  Negative for chest pain, palpitations, orthopnea and claudication.  Gastrointestinal:  Negative for abdominal pain, blood in stool, constipation, diarrhea, heartburn, melena, nausea and vomiting.  Genitourinary:  Negative for dysuria, flank pain, frequency, hematuria and urgency.  Musculoskeletal:  Negative for back pain, joint pain and myalgias.  Skin:  Negative for rash.  Neurological:  Negative for dizziness, tingling, focal weakness, seizures, weakness and headaches.  Endo/Heme/Allergies:  Does not bruise/bleed easily.  Psychiatric/Behavioral:  Negative for depression and suicidal ideas. The patient does not have insomnia.       Allergies  Allergen Reactions   Sulfa Antibiotics Other (See Comments)    Mouth sore and raw     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   Cough 03/02/2017  INTERMTTENT DRY COUGH   Depression    GERD (gastroesophageal reflux disease)    History of hiatal hernia    Personal history of chemotherapy    Personal history of radiation therapy    Skin cancer    Throat cancer (HCC) 1998   radiation   Wears dentures    full upper and lower     Past Surgical History:  Procedure  Laterality Date   BIOPSY  01/05/2023   Procedure: BIOPSY;  Surgeon: Toney Reil, MD;  Location: [facility];  Service: Gastroenterology;;   BREAST BIOPSY Right 1997   positive   BREAST BIOPSY Right 11/15/2017   INVASIVE MAMMARY CARCINOMA triple negative   BREAST BIOPSY Right 07/20/2020   [place] bx, Q marker, neg   BREAST LUMPECTOMY Right 1997   2019 also   BREAST LUMPECTOMY WITH SENTINEL LYMPH NODE BIOPSY Right 12/08/2017   Procedure: BREAST LUMPECTOMY WITH SENTINEL LYMPH NODE BX;  Surgeon: Earline Mayotte, MD;  Location: [facility] OR[facility];  Service: General;  Laterality: Right;   BREAST REDUCTION WITH MASTOPEXY Left 06/10/2019   Procedure: Left breast mastopexy reduction for symmetry;  Surgeon: Peggye Form, DO;  Location: [facility];  Service: Plastics;  Laterality: Left;  total 2.5 hours   CATARACT EXTRACTION W/PHACO Right 02/07/2022   Procedure: CATARACT EXTRACTION PHACO AND INTRAOCULAR LENS PLACEMENT (IOC) RIGHT DIABETIC;  Surgeon: Nevada Crane, MD;  Location: [facility];  Service: Ophthalmology;  Laterality: Right;  3.60 00:34.7   CATARACT EXTRACTION W/PHACO Left 02/28/2022   Procedure: CATARACT EXTRACTION PHACO AND INTRAOCULAR LENS PLACEMENT (IOC) LEFT DIABETIC 4.11 00:26.1;  Surgeon: Nevada Crane, MD;  Location: [facility];  Service: Ophthalmology;  Laterality: Left;   COLONOSCOPY  2015   COLONOSCOPY N/A 01/05/2023   Procedure: COLONOSCOPY;  Surgeon: Toney Reil, MD;  Location: [facility];  Service: Gastroenterology;  Laterality: N/A;   COLONOSCOPY WITH PROPOFOL N/A 10/29/2019   Procedure: COLONOSCOPY WITH PROPOFOL;  Surgeon: Midge Minium, MD;  Location: [facility] ENDOSCOPY;  Service: Endoscopy;  Laterality: N/A;   ESOPHAGOGASTRODUODENOSCOPY (EGD) WITH PROPOFOL N/A 05/24/2019   Procedure: ESOPHAGOGASTRODUODENOSCOPY (EGD) WITH PROPOFOL;  Surgeon: Midge Minium, MD;  Location: [facility] ENDOSCOPY;  Service: Endoscopy;  Laterality: N/A;    ESOPHAGOGASTRODUODENOSCOPY (EGD) WITH PROPOFOL N/A 09/06/2022   Procedure: ESOPHAGOGASTRODUODENOSCOPY (EGD) WITH PROPOFOL;  Surgeon: Midge Minium, MD;  Location: [facility];  Service: Endoscopy;  Laterality: N/A;   ESOPHAGOGASTRODUODENOSCOPY (EGD) WITH PROPOFOL  01/05/2023   Procedure: ESOPHAGOGASTRODUODENOSCOPY (EGD) WITH PROPOFOL;  Surgeon: Toney Reil, MD;  Location: [facility];  Service: Gastroenterology;;   ESOPHAGUS SURGERY     KNEE SURGERY Right    LIPOSUCTION WITH LIPOFILLING Right 06/10/2019   Procedure: right breast scar and capsule release with fat grafting;  Surgeon: Peggye Form, DO;  Location: [facility];  Service: Plastics;  Laterality: Right;   POLYPECTOMY  01/05/2023   Procedure: POLYPECTOMY;  Surgeon: Toney Reil, MD;  Location: [facility] ENDOSCOPY;  Service: Gastroenterology;;   [facility] REMOVAL     PORTACATH PLACEMENT Right 01/17/2018   Procedure: INSERTION PORT-A-CATH- RIGHT;  Surgeon: Earline Mayotte, MD;  Location: [facility];  Service: General;  Laterality: Right;   ROBOTIC ADRENALECTOMY Right 12/28/2016   Procedure: ROBOTIC ADRENALECTOMY;  Surgeon: Vanna Scotland, MD;  Location: [facility];  Service: Urology;  Laterality: Right;   SKIN CANCER EXCISION     THROAT SURGERY  1998   throat cancer    TUBAL LIGATION     VENTRAL  HERNIA REPAIR N/A 03/03/2017   Procedure: HERNIA REPAIR VENTRAL ADULT;  Surgeon: Ricarda Frame, MD;  Location: [facility];  Service: General;  Laterality: N/A;    Social History   Socioeconomic History   Marital status: Married    Spouse name: Not on file   Number of children: 4   Years of education: Not on file   Highest education level: 10th grade  Occupational History   Occupation: retired  Tobacco Use   Smoking status: Former    Current packs/day: 0.00    Average packs/day: 1.5 packs/day for 30.0 years (45.0 ttl pk-yrs)    Types: Cigarettes    Start date: 05/02/1965    Quit date: 05/03/1995     Years since quitting: 27.7    Passive exposure: Past   Smokeless tobacco: Never  Vaping Use   Vaping status: Never Used  Substance and Sexual Activity   Alcohol use: Not Currently    Alcohol/week: 4.0 standard drinks of alcohol    Types: 4 Shots of liquor per week    Comment: none   Drug use: No   Sexual activity: Not Currently    Birth control/protection: Post-menopausal  Other Topics Concern   Not on file  Social History Narrative   Not on file   Social Determinants of Health   Financial Resource Strain: Low Risk  (12/02/2021)   Overall Financial Resource Strain (CARDIA)    Difficulty of Paying Living Expenses: Not very hard  Food Insecurity: No Food Insecurity (12/05/2022)   Hunger Vital Sign    Worried About Running Out of Food in the Last Year: Never true    Ran Out of Food in the Last Year: Never true  Transportation Needs: No Transportation Needs (12/05/2022)   PRAPARE - Administrator, Civil Service (Medical): No    Lack of Transportation (Non-Medical): No  Physical Activity: Insufficiently Active (12/05/2022)   Exercise Vital Sign    Days of Exercise per Week: 3 days    Minutes of Exercise per Session: 30 min  Stress: Stress Concern Present (12/05/2022)   [employer] of Occupational Health - Occupational Stress Questionnaire    Feeling of Stress : Rather much  Social Connections: Moderately Isolated (12/05/2022)   Social Connection and Isolation Panel [NHANES]    Frequency of Communication with Friends and Family: Three times a week    Frequency of Social Gatherings with Friends and Family: Twice a week    Attends Religious Services: Never    Database administrator or Organizations: No    Attends Banker Meetings: Never    Marital Status: Married  Catering manager Violence: Not At Risk (12/05/2022)   Humiliation, Afraid, Rape, and Kick questionnaire    Fear of Current or Ex-Partner: No    Emotionally Abused: No    Physically Abused: No     Sexually Abused: No    Family History  Problem Relation Age of Onset   Diabetes Mother    Heart attack Mother    Cancer Sister        cancer of eyes, spread to lung, other places. died at 30   Cancer Sister 25       unknown primary, spread to bone, brain died in 74's   Cancer Paternal Aunt        unk type   Cancer Paternal Grandmother        type unk   Prostate cancer Neg Hx    Kidney cancer Neg Hx  Bladder Cancer Neg Hx    Breast cancer Neg Hx      Current Outpatient Medications:    alum & mag hydroxide-simeth (MAALOX PLUS) 400-400-40 MG/5ML suspension, Take 15 mLs by mouth every 6 (six) hours as needed for indigestion., Disp: 355 mL, Rfl: 0   aspirin EC 81 MG tablet, Take 1 tablet (81 mg total) by mouth daily. Swallow whole., Disp: 90 tablet, Rfl: 3   buPROPion (WELLBUTRIN XL) 150 MG 24 hr tablet, Take 3 tablets (450 mg total) by mouth daily., Disp: 90 tablet, Rfl: 1   busPIRone (BUSPAR) 5 MG tablet, Take 1 tablet (5 mg total) by mouth 3 (three) times daily., Disp: 270 tablet, Rfl: 0   cyclobenzaprine (FLEXERIL) 5 MG tablet, TAKE 1 TABLET BY MOUTH AT BEDTIME, Disp: 30 tablet, Rfl: 0   DULoxetine (CYMBALTA) 60 MG capsule, Take 1 capsule (60 mg total) by mouth 2 (two) times daily., Disp: 180 capsule, Rfl: 0   gabapentin (NEURONTIN) 300 MG capsule, TAKE 1 CAPSULE BY MOUTH 3 TIMES DAILY, Disp: 60 capsule, Rfl: 5   montelukast (SINGULAIR) 10 MG tablet, Take 1 tablet (10 mg total) by mouth at bedtime., Disp: 90 tablet, Rfl: 3   omeprazole (PRILOSEC) 40 MG capsule, TAKE 1 CAPSULE BY MOUTH DAILY, Disp: 90 capsule, Rfl: 0   oxyCODONE (ROXICODONE) 5 MG immediate release tablet, Take 1 tablet (5 mg total) by mouth every 6 (six) hours as needed for severe pain or moderate pain., Disp: 4 tablet, Rfl: 0   rOPINIRole (REQUIP) 0.25 MG tablet, TAKE 1 TABLET BY MOUTH AT BEDTIME, Disp: 90 tablet, Rfl: 1   rosuvastatin (CRESTOR) 40 MG tablet, Take 1 tablet (40 mg total) by mouth daily., Disp: 90  tablet, Rfl: 3 No current facility-administered medications for this visit.  Facility-Administered Medications Ordered in Other Visits:    cyanocobalamin ((VITAMIN B-12)) injection 1,000 mcg, 1,000 mcg, Intramuscular, Q30 days, Creig Hines, MD, 1,000 mcg at 08/27/21 1537   cyanocobalamin (VITAMIN B12) injection 1,000 mcg, 1,000 mcg, Intramuscular, Q30 days, Creig Hines, MD, 1,000 mcg at 03/16/22 1101  Physical exam:  Vitals:   01/16/23 1408  BP: (!) 98/51  Pulse: 92  Resp: 17  Temp: 98.6 F (37 C)  SpO2: 98%  Weight: 136 lb 6.4 oz (61.9 kg)   Physical Exam Cardiovascular:     Rate and Rhythm: Normal rate and regular rhythm.     Heart sounds: Normal heart sounds.  Pulmonary:     Effort: Pulmonary effort is normal.  Skin:    General: Skin is warm and dry.  Neurological:     Mental Status: She is alert and oriented to person, place, and time.         Latest Ref Rng & Units 12/06/2022    7:31 PM  CMP  Glucose 70 - 99 mg/dL 97   BUN 8 - 23 mg/dL 14   Creatinine 2.95 - 1.00 mg/dL 6.21   Sodium 308 - 657 mmol/L 139   Potassium 3.5 - 5.1 mmol/L 4.0   Chloride 98 - 111 mmol/L 104   CO2 22 - 32 mmol/L 26   Calcium 8.9 - 10.3 mg/dL 9.3   Total Protein 6.5 - 8.1 g/dL 6.5   Total Bilirubin 0.3 - 1.2 mg/dL 0.5   Alkaline Phos 38 - 126 U/L 63   AST 15 - 41 U/L 24   ALT 0 - 44 U/L 16       Latest Ref Rng & Units 01/06/2023  8:21 AM  CBC  WBC 4.0 - 10.5 K/uL 5.8   Hemoglobin 12.0 - 15.0 g/dL 62.1   Hematocrit 30.8 - 46.0 % 42.4   Platelets 150 - 400 K/uL 191     No images are attached to the encounter.  DG Chest Port 1 View  Result Date: 01/06/2023 CLINICAL DATA:  Status post lung biopsy, pain EXAM: PORTABLE CHEST 1 VIEW COMPARISON:  CT biopsy earlier same day FINDINGS: The cardiomediastinal silhouette is within normal limits. No pleural effusion. No pneumothorax. Hazy density in the left mid lung compatible with recent post biopsy changes. Small volume subsegmental  atelectasis in the left lower lung. No acute osseous abnormality. IMPRESSION: Expected post biopsy changes in the left lung without large volume consolidation or pneumothorax. Electronically Signed   By: Olive Bass M.D.   On: 01/06/2023 16:09   CT LUNG MASS BIOPSY  Result Date: 01/06/2023 INDICATION: Enlarging left upper lobe lung nodule EXAM: CT-guided core needle biopsy of left upper lobe lung nodule MEDICATIONS: None. ANESTHESIA/SEDATION: Moderate (conscious) sedation was employed during this procedure. A total of Versed 2 mg and Fentanyl 100 mcg was administered intravenously. Moderate Sedation Time: 23 minutes. The patient's level of consciousness and vital signs were monitored continuously by radiology nursing throughout the procedure under my direct supervision. FLUOROSCOPY TIME:  N/a COMPLICATIONS: None immediate. PROCEDURE: Informed written consent was obtained from the patient after a thorough discussion of the procedural risks, benefits and alternatives. All questions were addressed. Maximal Sterile Barrier Technique was utilized including caps, mask, sterile gowns, sterile gloves, sterile drape, hand hygiene and skin antiseptic. A timeout was performed prior to the initiation of the procedure. The patient was placed supine on the exam table. Limited CT of the chest was performed for planning purposes. This demonstrated target nodule in the periphery of the left upper lobe. Skin entry site was marked, and the overlying skin was prepped and draped in the standard sterile fashion. Local analgesia was obtained with 1% lidocaine. Using intermittent CT fluoroscopy, a 17 gauge introducer needle was advanced towards the identified lesion. Subsequently, core needle biopsy was performed using an 18 gauge core biopsy device x4 total passes. Only scant tissue was obtained before the patient began experiencing coughing blood, in the procedure was stopped. Specimens were submitted in formalin to pathology for  further handling. Limited postprocedure imaging demonstrated expected bleeding surrounding the biopsied nodule. No pneumothorax. The patient was transferred to recovery in stable condition. IMPRESSION: CT-guided core needle biopsy of left upper lobe lung nodule. Note that only scant tissue was obtained before the patient began experiencing bleeding and coughing requiring the procedure to be stopped. These results were relayed to the referring oncologist. Electronically Signed   By: Olive Bass M.D.   On: 01/06/2023 11:11   NM PET Image Restag (PS) Skull Base To Thigh  Result Date: 12/26/2022 CLINICAL DATA:  Initial treatment strategy for breast cancer. EXAM: NUCLEAR MEDICINE PET SKULL BASE TO THIGH TECHNIQUE: 7.4 mCi F-18 FDG was injected intravenously. Full-ring PET imaging was performed from the skull base to thigh after the radiotracer. CT data was obtained and used for attenuation correction and anatomic localization. Fasting blood glucose: 85 mg/dl COMPARISON:  CT chest 65/78/4696, 05/13/2022, MR abdomen 02/04/2022. CT abdomen pelvis 12/09/2021. FINDINGS: Mediastinal blood pool activity: SUV max 2.4 Liver activity: SUV max NA NECK: No abnormal hypermetabolism. Incidental CT findings: None. CHEST: Hypermetabolic mediastinal, subcarinal and left hilar adenopathy. Index subcarinal lymph node measures 5 mm, SUV max 6.5. 13 mm  lingular nodule (4/53), SUV max 9.2. Additional scattered pulmonary nodules are not show abnormal hypermetabolism, some of which are too small for PET resolution. Mild hypermetabolism associated with postoperative scarring in right breast. Incidental CT findings: Atherosclerotic calcification of the aorta, aortic valve and coronary arteries. Heart is enlarged. No pericardial or pleural effusion. ABDOMEN/PELVIS: No abnormal hypermetabolism. Incidental CT findings: Liver, gallbladder, adrenal glands, kidneys, spleen, pancreas, stomach and bowel are grossly unremarkable. SKELETON: Focal  hypermetabolism in the posterolateral right ninth rib, associated with a minimally displaced fracture, SUV max 6.3. No additional abnormal hypermetabolism. Incidental CT findings: None. IMPRESSION: 1. Intrathoracic nodal and pulmonary parenchymal metastatic disease. 2. Hypermetabolic right ninth rib fracture, worrisome for a metastasis. 3. Aortic atherosclerosis (ICD10-I70.0). Coronary artery calcification. Electronically Signed   By: Leanna Battles M.D.   On: 12/26/2022 08:56     Assessment and plan- Patient is a 67 y.o. female with prior history of multiple cancers including most recently breast cancer in 2019.  She is here to discuss PET scan results and further management  CT angio chest that was done in early August 2024 showed subcentimeter bilateral lung nodules.  There was a lobulated subpleural left lower lobe nodule measuring 12 mm which has remained unchanged as compared to prior scans.  There was also a left upper lobe lung nodule measuring 15 x 13 mm which was increasing in size and density as compared to prior exam.  I have reviewed PET CT scan images independently and discussed findings with the patient.  PET CT scan shows that the left lower lobe lung nodule is not hypermetabolic but the left upper lobe lung nodule was hypermetabolic with an SUV of 9.2.  There were additional scattered pulmonary nodules as well as concern for hypermetabolic mediastinal subcarinal and left hilar adenopathy concerning for metastatic disease.  She underwent CT-guided biopsy by interventional radiology but the procedure was aborted due to hemoptysis and biopsy was unrevealing.  I had discussed her case with Dr. Erling Conte for consideration of bronchoscopy guided biopsy.  Patient states that she has not heard from his team yet and I am have reached out to Dr. Elmer Picker again to schedule the same.  She will keep her appointment with me in November for now as planned   Visit Diagnosis 1. Abnormal PET scan, lung       Dr. Owens Shark, MD, MPH [facility] [phone] 01/16/2023 2:55 PM

## 2023-01-16 NOTE — Assessment & Plan Note (Signed)
Chronic, worsening since EBUS No adventitious lung sounds Normal SPO2 Likely associated with anxiety regarding cancer treatment start POC covid negative

## 2023-01-16 NOTE — Assessment & Plan Note (Signed)
Chronic, worsening Will increase wellbutrin to further assist Has spoken with LCSW from Oncology to assist

## 2023-01-16 NOTE — Progress Notes (Signed)
Established patient visit   Patient: Denise Watts   DOB: 1955/12/08   67 y.o. Female  MRN: [id number] Visit Date: 01/16/2023  Today's healthcare provider: Jacky Kindle, FNP  Introduced to nurse practitioner role and practice setting.  All questions answered.  Discussed provider/patient relationship and expectations.  Subjective    Cough   HPI     Cough    Additional comments: Patient reports symptom present for about 3 weeks now but seems to be getting worse and she is now coughing up green phlegm. Associated with shortness of breath. Patient reports taking tessalon for cough.       Last edited by Acey Lav, CMA on 01/16/2023 10:26 AM.      Medications: Outpatient Medications Prior to Visit  Medication Sig   alum & mag hydroxide-simeth (MAALOX PLUS) 400-400-40 MG/5ML suspension Take 15 mLs by mouth every 6 (six) hours as needed for indigestion.   aspirin EC 81 MG tablet Take 1 tablet (81 mg total) by mouth daily. Swallow whole.   busPIRone (BUSPAR) 5 MG tablet Take 1 tablet (5 mg total) by mouth 3 (three) times daily.   cyclobenzaprine (FLEXERIL) 5 MG tablet TAKE 1 TABLET BY MOUTH AT BEDTIME   DULoxetine (CYMBALTA) 60 MG capsule Take 1 capsule (60 mg total) by mouth 2 (two) times daily.   gabapentin (NEURONTIN) 300 MG capsule TAKE 1 CAPSULE BY MOUTH 3 TIMES DAILY   montelukast (SINGULAIR) 10 MG tablet Take 1 tablet (10 mg total) by mouth at bedtime.   omeprazole (PRILOSEC) 40 MG capsule TAKE 1 CAPSULE BY MOUTH DAILY   oxyCODONE (ROXICODONE) 5 MG immediate release tablet Take 1 tablet (5 mg total) by mouth every 6 (six) hours as needed for severe pain or moderate pain.   rOPINIRole (REQUIP) 0.25 MG tablet TAKE 1 TABLET BY MOUTH AT BEDTIME   rosuvastatin (CRESTOR) 40 MG tablet Take 1 tablet (40 mg total) by mouth daily.   [DISCONTINUED] albuterol (PROVENTIL) (2.5 MG/3ML) 0.083% nebulizer solution Take 3 mLs (2.5 mg total) by nebulization every 6 (six) hours as needed for  wheezing or shortness of breath. (Patient not taking: Reported on 01/06/2023)   [DISCONTINUED] albuterol (VENTOLIN HFA) 108 (90 Base) MCG/ACT inhaler Inhale 2 puffs into the lungs every 6 (six) hours as needed for shortness of breath. (Patient not taking: Reported on 01/06/2023)   [DISCONTINUED] Budeson-Glycopyrrol-Formoterol (BREZTRI AEROSPHERE) 160-9-4.8 MCG/ACT AERO Inhale 2 puffs into the lungs in the morning and at bedtime. (Patient not taking: Reported on 01/06/2023)   [DISCONTINUED] buPROPion ER (WELLBUTRIN SR) 100 MG 12 hr tablet Take 1 tablet (100 mg total) by mouth 2 (two) times daily.   [DISCONTINUED] fluticasone (FLONASE) 50 MCG/ACT nasal spray Place 1 spray into both nostrils at bedtime. (Patient not taking: Reported on 01/06/2023)   [DISCONTINUED] meloxicam (MOBIC) 7.5 MG tablet TAKE 1 TABLET BY MOUTH DAILY   [DISCONTINUED] mirabegron ER (MYRBETRIQ) 25 MG TB24 tablet Take 25 mg by mouth. 2 tablets daily   Facility-Administered Medications Prior to Visit  Medication Dose Route Frequency Provider   cyanocobalamin ((VITAMIN B-12)) injection 1,000 mcg  1,000 mcg Intramuscular Q30 days Creig Hines, MD   cyanocobalamin (VITAMIN B12) injection 1,000 mcg  1,000 mcg Intramuscular Q30 days Creig Hines, MD   [DISCONTINUED] 0.9 %  sodium chloride infusion   Intravenous Once PRN Creig Hines, MD   [DISCONTINUED] albuterol (PROVENTIL) (2.5 MG/3ML) 0.083% nebulizer solution 2.5 mg  2.5 mg Nebulization Once PRN Creig Hines, MD  Review of Systems  Respiratory:  Positive for cough.         Objective    BP 104/74 (BP Location: Left Arm, Patient Position: Sitting, Cuff Size: Normal)   Pulse 90   Ht 5\' 1"  (1.549 m)   Wt 135 lb 9.6 oz (61.5 kg)   SpO2 100%   BMI 25.62 kg/m   Physical Exam Vitals and nursing note reviewed.  Constitutional:      General: She is not in acute distress.    Appearance: Normal appearance. She is normal weight. She is not ill-appearing, toxic-appearing or  diaphoretic.  HENT:     Head: Normocephalic and atraumatic.  Cardiovascular:     Rate and Rhythm: Normal rate and regular rhythm.     Pulses: Normal pulses.     Heart sounds: Normal heart sounds. No murmur heard.    No friction rub. No gallop.  Pulmonary:     Effort: Pulmonary effort is normal. No respiratory distress.     Breath sounds: Normal breath sounds. No stridor. No wheezing, rhonchi or rales.     Comments: C/o SOB; no adventitious lung sounds, normal SPO2 Chest:     Chest wall: No tenderness.  Musculoskeletal:        General: No swelling, tenderness, deformity or signs of injury. Normal range of motion.     Right lower leg: No edema.     Left lower leg: No edema.  Skin:    General: Skin is warm and dry.     Capillary Refill: Capillary refill takes less than 2 seconds.     Coloration: Skin is not jaundiced or pale.     Findings: No bruising, erythema, lesion or rash.  Neurological:     General: No focal deficit present.     Mental Status: She is alert and oriented to person, place, and time. Mental status is at baseline.     Cranial Nerves: No cranial nerve deficit.     Sensory: No sensory deficit.     Motor: No weakness.     Coordination: Coordination normal.  Psychiatric:        Mood and Affect: Mood normal.        Behavior: Behavior normal.        Thought Content: Thought content normal.        Judgment: Judgment normal.     No results found for any visits on 01/16/23.  Assessment & Plan     Problem List Items Addressed This Visit       Other   Anxiety about health    Chronic, worsening Plan to start cancer treatment this afternoon; complaints of cough since EBUS      Relevant Medications   buPROPion (WELLBUTRIN XL) 150 MG 24 hr tablet   Chronic cough - Primary    Chronic, worsening since EBUS No adventitious lung sounds Normal SPO2 Likely associated with anxiety regarding cancer treatment start POC covid negative       Malignant neoplasm of  lower-outer quadrant of right breast of female, estrogen receptor negative (HCC)    Will increase wellbutrin to 450 mg at this time given increased symptoms with recurrent cancer diagnoses and treatment Mets noted to L ribs s/p fracture with known lung nodules as well Appt this PM for treatment       Severe depression (HCC)    Chronic, worsening Will increase wellbutrin to further assist Has spoken with LCSW from Oncology to assist       Relevant Medications  buPROPion (WELLBUTRIN XL) 150 MG 24 hr tablet   Symptoms of upper respiratory infection (URI)    Pt unable to decipher acute from chronic; notes worse symptoms in past 3 weeks Denies fevers, chills Pt is well appearing outside of anxiety POC COVID checked given concern for treatment start today- negative       Relevant Orders   POC COVID-19   Return in about 6 weeks (around 02/27/2023), or if symptoms worsen or fail to improve, for anxiety and depression.     Leilani Merl, FNP, have reviewed all documentation for this visit. The documentation on 01/16/23 for the exam, diagnosis, procedures, and orders are all accurate and complete.  Jacky Kindle, FNP  [facility] [phone] (phone) [phone] (fax)  [facility]

## 2023-01-20 ENCOUNTER — Other Ambulatory Visit: Payer: Self-pay | Admitting: Pulmonary Disease

## 2023-01-20 ENCOUNTER — Other Ambulatory Visit: Payer: Self-pay

## 2023-01-20 ENCOUNTER — Encounter
Admission: RE | Admit: 2023-01-20 | Discharge: 2023-01-20 | Disposition: A | Discharge status: Home / Self Care | Payer: Medicare HMO | Source: Ambulatory Visit | Attending: Pulmonary Disease

## 2023-01-20 ENCOUNTER — Telehealth: Payer: Self-pay

## 2023-01-20 DIAGNOSIS — R911 Solitary pulmonary nodule: Secondary | ICD-10-CM

## 2023-01-20 DIAGNOSIS — I25118 Atherosclerotic heart disease of native coronary artery with other forms of angina pectoris: Secondary | ICD-10-CM

## 2023-01-20 DIAGNOSIS — R0602 Shortness of breath: Secondary | ICD-10-CM

## 2023-01-20 DIAGNOSIS — Z01812 Encounter for preprocedural laboratory examination: Secondary | ICD-10-CM

## 2023-01-20 HISTORY — DX: Dyspnea, unspecified: R06.00

## 2023-01-20 HISTORY — DX: Chronic obstructive pulmonary disease, unspecified: J44.9

## 2023-01-20 HISTORY — DX: Atherosclerotic heart disease of native coronary artery without angina pectoris: I25.10

## 2023-01-20 NOTE — Telephone Encounter (Signed)
Copied from CRM [id number]. Topic: General - Other >> Jan 19, 2023  3:55 PM Jannifer Rodney M wrote: Reason for CRM: April with [facility] called for an update on fax sent on 01/11/23. Cb# [phone]

## 2023-01-20 NOTE — Patient Instructions (Addendum)
Your procedure is scheduled on: 01/25/23 - Wednesday Report to the Registration Desk on the 1st floor of the [facility]. To find out your arrival time, please call [phone] between 1PM - 3PM on: 01/24/23 - Tuesday If your arrival time is 6:00 am, do not arrive before that time as the Medical Mall entrance doors do not open until 6:00 am.  REMEMBER: Instructions that are not followed completely may result in serious medical risk, up to and including death; or upon the discretion of your surgeon and anesthesiologist your surgery may need to be rescheduled.  Do not eat food or drink any liquids after midnight the night before surgery.  No gum chewing or hard candies.  One week prior to surgery: Stop Anti-inflammatories (NSAIDS) such as Advil, Aleve, Ibuprofen, Motrin, Naproxen, Naprosyn and Aspirin based products such as Excedrin, Goody's Powder, BC Powder.  Stop ANY OVER THE COUNTER supplements until after surgery.  You may however, continue to take Tylenol if needed for pain up until the day of surgery.   TAKE ONLY THESE MEDICATIONS THE MORNING OF SURGERY WITH A SIP OF WATER:  omeprazole (PRILOSEC) - (take one the night before and one on the morning of surgery - helps to prevent nausea after surgery.) buPROPion (WELLBUTRIN XL)  busPIRone (BUSPAR)  DULoxetine (CYMBALTA)  gabapentin (NEURONTIN)   No Alcohol for 24 hours before or after surgery.  No Smoking including e-cigarettes for 24 hours before surgery.  No chewable tobacco products for at least 6 hours before surgery.  No nicotine patches on the day of surgery.  Do not use any "recreational" drugs for at least a week (preferably 2 weeks) before your surgery.  Please be advised that the combination of cocaine and anesthesia may have negative outcomes, up to and including death. If you test positive for cocaine, your surgery will be cancelled.  On the morning of surgery brush your teeth with toothpaste and water, you may  rinse your mouth with mouthwash if you wish. Do not swallow any toothpaste or mouthwash.  Do not wear jewelry, make-up, hairpins, clips or nail polish.  For welded (permanent) jewelry: bracelets, anklets, waist bands, etc.  Please have this removed prior to surgery.  If it is not removed, there is a chance that hospital personnel will need to cut it off on the day of surgery.  Do not wear lotions, powders, or perfumes.   Do not shave body hair from the neck down 48 hours before surgery.  Contact lenses, hearing aids and dentures may not be worn into surgery.  Do not bring valuables to the hospital. [facility] is not responsible for any missing/lost belongings or valuables.   Notify your doctor if there is any change in your medical condition (cold, fever, infection).  Wear comfortable clothing (specific to your surgery type) to the hospital.  After surgery, you can help prevent lung complications by doing breathing exercises.  Take deep breaths and cough every 1-2 hours. Your doctor may order a device called an Incentive Spirometer to help you take deep breaths. When coughing or sneezing, hold a pillow firmly against your incision with both hands. This is called "splinting." Doing this helps protect your incision. It also decreases belly discomfort.  If you are being admitted to the hospital overnight, leave your suitcase in the car. After surgery it may be brought to your room.  In case of increased patient census, it may be necessary for you, the patient, to continue your postoperative care in the  Same Day Surgery department.  If you are being discharged the day of surgery, you will not be allowed to drive home. You will need a responsible individual to drive you home and stay with you for 24 hours after surgery.   If you are taking public transportation, you will need to have a responsible individual with you.  Please call the Pre-admissions Testing Dept. at [phone] if you  have any questions about these instructions.  Surgery Visitation Policy:  Patients having surgery or a procedure may have two visitors.  Children under the age of 58 must have an adult with them who is not the patient.  Inpatient Visitation:    Visiting hours are 7 a.m. to 8 p.m. Up to four visitors are allowed at one time in a patient room. The visitors may rotate out with other people during the day.  One visitor age 87 or older may stay with the patient overnight and must be in the room by 8 p.m.

## 2023-01-23 ENCOUNTER — Encounter
Admission: RE | Admit: 2023-01-23 | Discharge: 2023-01-23 | Disposition: A | Discharge status: Home / Self Care | Payer: Medicare HMO | Source: Ambulatory Visit | Attending: Pulmonary Disease | Admitting: Pulmonary Disease

## 2023-01-23 ENCOUNTER — Ambulatory Visit
Admission: RE | Admit: 2023-01-23 | Discharge: 2023-01-23 | Disposition: A | Discharge status: Home / Self Care | Payer: Medicare HMO | Source: Ambulatory Visit | Attending: Pulmonary Disease | Admitting: Pulmonary Disease

## 2023-01-23 DIAGNOSIS — J95811 Postprocedural pneumothorax: Secondary | ICD-10-CM | POA: Diagnosis present

## 2023-01-23 DIAGNOSIS — Z8249 Family history of ischemic heart disease and other diseases of the circulatory system: Secondary | ICD-10-CM | POA: Diagnosis not present

## 2023-01-23 DIAGNOSIS — I25118 Atherosclerotic heart disease of native coronary artery with other forms of angina pectoris: Secondary | ICD-10-CM | POA: Insufficient documentation

## 2023-01-23 DIAGNOSIS — K219 Gastro-esophageal reflux disease without esophagitis: Secondary | ICD-10-CM | POA: Diagnosis present

## 2023-01-23 DIAGNOSIS — R0489 Hemorrhage from other sites in respiratory passages: Secondary | ICD-10-CM | POA: Diagnosis not present

## 2023-01-23 DIAGNOSIS — Z923 Personal history of irradiation: Secondary | ICD-10-CM | POA: Diagnosis not present

## 2023-01-23 DIAGNOSIS — R918 Other nonspecific abnormal finding of lung field: Secondary | ICD-10-CM | POA: Diagnosis not present

## 2023-01-23 DIAGNOSIS — I2510 Atherosclerotic heart disease of native coronary artery without angina pectoris: Secondary | ICD-10-CM | POA: Diagnosis present

## 2023-01-23 DIAGNOSIS — Z171 Estrogen receptor negative status [ER-]: Secondary | ICD-10-CM | POA: Diagnosis not present

## 2023-01-23 DIAGNOSIS — J9601 Acute respiratory failure with hypoxia: Secondary | ICD-10-CM | POA: Diagnosis present

## 2023-01-23 DIAGNOSIS — J449 Chronic obstructive pulmonary disease, unspecified: Secondary | ICD-10-CM | POA: Diagnosis present

## 2023-01-23 DIAGNOSIS — M1711 Unilateral primary osteoarthritis, right knee: Secondary | ICD-10-CM | POA: Diagnosis present

## 2023-01-23 DIAGNOSIS — Z87891 Personal history of nicotine dependence: Secondary | ICD-10-CM | POA: Diagnosis not present

## 2023-01-23 DIAGNOSIS — Z833 Family history of diabetes mellitus: Secondary | ICD-10-CM | POA: Diagnosis not present

## 2023-01-23 DIAGNOSIS — I480 Paroxysmal atrial fibrillation: Secondary | ICD-10-CM | POA: Diagnosis not present

## 2023-01-23 DIAGNOSIS — Z01812 Encounter for preprocedural laboratory examination: Secondary | ICD-10-CM | POA: Insufficient documentation

## 2023-01-23 DIAGNOSIS — R9389 Abnormal findings on diagnostic imaging of other specified body structures: Secondary | ICD-10-CM | POA: Diagnosis not present

## 2023-01-23 DIAGNOSIS — F32A Depression, unspecified: Secondary | ICD-10-CM | POA: Diagnosis present

## 2023-01-23 DIAGNOSIS — K449 Diaphragmatic hernia without obstruction or gangrene: Secondary | ICD-10-CM | POA: Diagnosis present

## 2023-01-23 DIAGNOSIS — Z01810 Encounter for preprocedural cardiovascular examination: Secondary | ICD-10-CM | POA: Insufficient documentation

## 2023-01-23 DIAGNOSIS — Z853 Personal history of malignant neoplasm of breast: Secondary | ICD-10-CM | POA: Diagnosis not present

## 2023-01-23 DIAGNOSIS — R7989 Other specified abnormal findings of blood chemistry: Secondary | ICD-10-CM | POA: Diagnosis not present

## 2023-01-23 DIAGNOSIS — E785 Hyperlipidemia, unspecified: Secondary | ICD-10-CM | POA: Diagnosis present

## 2023-01-23 DIAGNOSIS — J984 Other disorders of lung: Secondary | ICD-10-CM | POA: Diagnosis not present

## 2023-01-23 DIAGNOSIS — Z48813 Encounter for surgical aftercare following surgery on the respiratory system: Secondary | ICD-10-CM | POA: Diagnosis not present

## 2023-01-23 DIAGNOSIS — R911 Solitary pulmonary nodule: Secondary | ICD-10-CM | POA: Insufficient documentation

## 2023-01-23 DIAGNOSIS — I10 Essential (primary) hypertension: Secondary | ICD-10-CM | POA: Diagnosis present

## 2023-01-23 DIAGNOSIS — R0602 Shortness of breath: Secondary | ICD-10-CM | POA: Insufficient documentation

## 2023-01-23 DIAGNOSIS — Z9221 Personal history of antineoplastic chemotherapy: Secondary | ICD-10-CM | POA: Diagnosis not present

## 2023-01-23 DIAGNOSIS — Z7982 Long term (current) use of aspirin: Secondary | ICD-10-CM | POA: Diagnosis not present

## 2023-01-23 DIAGNOSIS — S2231XA Fracture of one rib, right side, initial encounter for closed fracture: Secondary | ICD-10-CM | POA: Diagnosis not present

## 2023-01-23 DIAGNOSIS — C3412 Malignant neoplasm of upper lobe, left bronchus or lung: Secondary | ICD-10-CM | POA: Diagnosis present

## 2023-01-23 DIAGNOSIS — J939 Pneumothorax, unspecified: Secondary | ICD-10-CM | POA: Diagnosis present

## 2023-01-23 DIAGNOSIS — Y838 Other surgical procedures as the cause of abnormal reaction of the patient, or of later complication, without mention of misadventure at the time of the procedure: Secondary | ICD-10-CM | POA: Diagnosis present

## 2023-01-23 DIAGNOSIS — F419 Anxiety disorder, unspecified: Secondary | ICD-10-CM | POA: Diagnosis present

## 2023-01-23 DIAGNOSIS — R0989 Other specified symptoms and signs involving the circulatory and respiratory systems: Secondary | ICD-10-CM | POA: Diagnosis not present

## 2023-01-23 DIAGNOSIS — Z85828 Personal history of other malignant neoplasm of skin: Secondary | ICD-10-CM | POA: Diagnosis not present

## 2023-01-23 DIAGNOSIS — I4891 Unspecified atrial fibrillation: Secondary | ICD-10-CM | POA: Diagnosis not present

## 2023-01-23 DIAGNOSIS — I483 Typical atrial flutter: Secondary | ICD-10-CM | POA: Diagnosis present

## 2023-01-23 DIAGNOSIS — Z4682 Encounter for fitting and adjustment of non-vascular catheter: Secondary | ICD-10-CM | POA: Diagnosis not present

## 2023-01-23 DIAGNOSIS — E119 Type 2 diabetes mellitus without complications: Secondary | ICD-10-CM | POA: Diagnosis present

## 2023-01-23 DIAGNOSIS — J90 Pleural effusion, not elsewhere classified: Secondary | ICD-10-CM | POA: Diagnosis not present

## 2023-01-23 DIAGNOSIS — R079 Chest pain, unspecified: Secondary | ICD-10-CM | POA: Diagnosis not present

## 2023-01-23 LAB — CBC
HCT: 40.9 % (ref 36.0–46.0)
Hemoglobin: 13.5 g/dL (ref 12.0–15.0)
MCH: 32.1 pg (ref 26.0–34.0)
MCHC: 33 g/dL (ref 30.0–36.0)
MCV: 97.1 fL (ref 80.0–100.0)
Platelets: 179 10*3/uL (ref 150–400)
RBC: 4.21 MIL/uL (ref 3.87–5.11)
RDW: 12.2 % (ref 11.5–15.5)
WBC: 4.8 10*3/uL (ref 4.0–10.5)
nRBC: 0 % (ref 0.0–0.2)

## 2023-01-23 LAB — PROTIME-INR
INR: 1 (ref 0.8–1.2)
Prothrombin Time: 13.8 seconds (ref 11.4–15.2)

## 2023-01-23 LAB — APTT: aPTT: 26 seconds (ref 24–36)

## 2023-01-23 NOTE — Telephone Encounter (Signed)
Called left vm to refax

## 2023-01-24 MED ORDER — LACTATED RINGERS IV SOLN: INTRAVENOUS | Status: DC

## 2023-01-24 MED ORDER — CHLORHEXIDINE GLUCONATE 0.12 % MT SOLN
15.0000 mL | Freq: Once | OROMUCOSAL | Status: AC
  Administered 2023-01-25: 15 mL via OROMUCOSAL

## 2023-01-24 MED ORDER — ORAL CARE MOUTH RINSE: 15.0000 mL | Freq: Once | OROMUCOSAL | Status: AC

## 2023-01-25 ENCOUNTER — Inpatient Hospital Stay: Payer: Medicare HMO

## 2023-01-25 ENCOUNTER — Telehealth: Payer: Self-pay | Admitting: *Deleted

## 2023-01-25 ENCOUNTER — Ambulatory Visit: Payer: Medicare HMO | Admitting: Anesthesiology

## 2023-01-25 ENCOUNTER — Encounter
Admission: RE | Disposition: A | Discharge status: Home / Self Care | Payer: Self-pay | Source: Home / Self Care | Attending: Internal Medicine

## 2023-01-25 ENCOUNTER — Ambulatory Visit: Payer: Medicare HMO

## 2023-01-25 ENCOUNTER — Other Ambulatory Visit: Payer: Self-pay

## 2023-01-25 ENCOUNTER — Encounter: Payer: Self-pay | Admitting: *Deleted

## 2023-01-25 ENCOUNTER — Ambulatory Visit: Payer: Medicare HMO | Admitting: Urgent Care

## 2023-01-25 ENCOUNTER — Inpatient Hospital Stay
Admission: RE | Admit: 2023-01-25 | Discharge: 2023-01-28 | DRG: 166 | Disposition: A | Discharge status: Home / Self Care | Payer: Medicare HMO | Attending: Pulmonary Disease | Admitting: Pulmonary Disease

## 2023-01-25 DIAGNOSIS — Y838 Other surgical procedures as the cause of abnormal reaction of the patient, or of later complication, without mention of misadventure at the time of the procedure: Secondary | ICD-10-CM | POA: Diagnosis present

## 2023-01-25 DIAGNOSIS — I10 Essential (primary) hypertension: Secondary | ICD-10-CM | POA: Diagnosis present

## 2023-01-25 DIAGNOSIS — Z171 Estrogen receptor negative status [ER-]: Secondary | ICD-10-CM | POA: Diagnosis not present

## 2023-01-25 DIAGNOSIS — J95811 Postprocedural pneumothorax: Secondary | ICD-10-CM

## 2023-01-25 DIAGNOSIS — Z79899 Other long term (current) drug therapy: Secondary | ICD-10-CM

## 2023-01-25 DIAGNOSIS — C3412 Malignant neoplasm of upper lobe, left bronchus or lung: Secondary | ICD-10-CM | POA: Diagnosis present

## 2023-01-25 DIAGNOSIS — Z48813 Encounter for surgical aftercare following surgery on the respiratory system: Secondary | ICD-10-CM | POA: Diagnosis not present

## 2023-01-25 DIAGNOSIS — R000 Tachycardia, unspecified: Secondary | ICD-10-CM

## 2023-01-25 DIAGNOSIS — Z853 Personal history of malignant neoplasm of breast: Secondary | ICD-10-CM

## 2023-01-25 DIAGNOSIS — J984 Other disorders of lung: Secondary | ICD-10-CM | POA: Diagnosis not present

## 2023-01-25 DIAGNOSIS — Z9221 Personal history of antineoplastic chemotherapy: Secondary | ICD-10-CM | POA: Diagnosis not present

## 2023-01-25 DIAGNOSIS — I2510 Atherosclerotic heart disease of native coronary artery without angina pectoris: Secondary | ICD-10-CM | POA: Diagnosis present

## 2023-01-25 DIAGNOSIS — J449 Chronic obstructive pulmonary disease, unspecified: Secondary | ICD-10-CM | POA: Diagnosis present

## 2023-01-25 DIAGNOSIS — Z87891 Personal history of nicotine dependence: Secondary | ICD-10-CM | POA: Diagnosis not present

## 2023-01-25 DIAGNOSIS — M1711 Unilateral primary osteoarthritis, right knee: Secondary | ICD-10-CM | POA: Diagnosis present

## 2023-01-25 DIAGNOSIS — J939 Pneumothorax, unspecified: Secondary | ICD-10-CM | POA: Diagnosis present

## 2023-01-25 DIAGNOSIS — F419 Anxiety disorder, unspecified: Secondary | ICD-10-CM | POA: Diagnosis present

## 2023-01-25 DIAGNOSIS — Z85828 Personal history of other malignant neoplasm of skin: Secondary | ICD-10-CM | POA: Diagnosis not present

## 2023-01-25 DIAGNOSIS — Z7982 Long term (current) use of aspirin: Secondary | ICD-10-CM

## 2023-01-25 DIAGNOSIS — Z923 Personal history of irradiation: Secondary | ICD-10-CM

## 2023-01-25 DIAGNOSIS — Z8249 Family history of ischemic heart disease and other diseases of the circulatory system: Secondary | ICD-10-CM | POA: Diagnosis not present

## 2023-01-25 DIAGNOSIS — R0989 Other specified symptoms and signs involving the circulatory and respiratory systems: Secondary | ICD-10-CM | POA: Diagnosis not present

## 2023-01-25 DIAGNOSIS — K219 Gastro-esophageal reflux disease without esophagitis: Secondary | ICD-10-CM | POA: Diagnosis present

## 2023-01-25 DIAGNOSIS — Z833 Family history of diabetes mellitus: Secondary | ICD-10-CM | POA: Diagnosis not present

## 2023-01-25 DIAGNOSIS — E785 Hyperlipidemia, unspecified: Secondary | ICD-10-CM | POA: Diagnosis present

## 2023-01-25 DIAGNOSIS — I483 Typical atrial flutter: Secondary | ICD-10-CM

## 2023-01-25 DIAGNOSIS — I480 Paroxysmal atrial fibrillation: Secondary | ICD-10-CM

## 2023-01-25 DIAGNOSIS — E119 Type 2 diabetes mellitus without complications: Secondary | ICD-10-CM | POA: Diagnosis present

## 2023-01-25 DIAGNOSIS — F32A Depression, unspecified: Secondary | ICD-10-CM | POA: Diagnosis present

## 2023-01-25 DIAGNOSIS — K449 Diaphragmatic hernia without obstruction or gangrene: Secondary | ICD-10-CM | POA: Diagnosis present

## 2023-01-25 DIAGNOSIS — Z555 Less than a high school diploma: Secondary | ICD-10-CM

## 2023-01-25 DIAGNOSIS — J90 Pleural effusion, not elsewhere classified: Secondary | ICD-10-CM | POA: Diagnosis not present

## 2023-01-25 DIAGNOSIS — J9601 Acute respiratory failure with hypoxia: Secondary | ICD-10-CM | POA: Diagnosis present

## 2023-01-25 DIAGNOSIS — R9389 Abnormal findings on diagnostic imaging of other specified body structures: Secondary | ICD-10-CM | POA: Diagnosis not present

## 2023-01-25 DIAGNOSIS — R079 Chest pain, unspecified: Secondary | ICD-10-CM | POA: Diagnosis not present

## 2023-01-25 DIAGNOSIS — R7989 Other specified abnormal findings of blood chemistry: Secondary | ICD-10-CM

## 2023-01-25 DIAGNOSIS — R911 Solitary pulmonary nodule: Secondary | ICD-10-CM | POA: Diagnosis not present

## 2023-01-25 DIAGNOSIS — Z4682 Encounter for fitting and adjustment of non-vascular catheter: Secondary | ICD-10-CM | POA: Diagnosis not present

## 2023-01-25 DIAGNOSIS — I4891 Unspecified atrial fibrillation: Secondary | ICD-10-CM | POA: Diagnosis not present

## 2023-01-25 DIAGNOSIS — Z882 Allergy status to sulfonamides status: Secondary | ICD-10-CM

## 2023-01-25 DIAGNOSIS — Z85819 Personal history of malignant neoplasm of unspecified site of lip, oral cavity, and pharynx: Secondary | ICD-10-CM

## 2023-01-25 DIAGNOSIS — R0489 Hemorrhage from other sites in respiratory passages: Secondary | ICD-10-CM | POA: Diagnosis not present

## 2023-01-25 DIAGNOSIS — R918 Other nonspecific abnormal finding of lung field: Secondary | ICD-10-CM | POA: Diagnosis not present

## 2023-01-25 HISTORY — DX: Postprocedural pneumothorax: J95.811

## 2023-01-25 HISTORY — PX: VIDEO BRONCHOSCOPY WITH ENDOBRONCHIAL ULTRASOUND: SHX6177

## 2023-01-25 LAB — BLOOD GAS, ARTERIAL
Acid-Base Excess: 3.3 mmol/L — ABNORMAL HIGH (ref 0.0–2.0)
Acid-base deficit: 2.4 mmol/L — ABNORMAL HIGH (ref 0.0–2.0)
Bicarbonate: 27.5 mmol/L (ref 20.0–28.0)
Bicarbonate: 27.8 mmol/L (ref 20.0–28.0)
FIO2: 60 %
FIO2: 35 %
MECHVT: 380 mL
MECHVT: 450 mL
Mechanical Rate: 18
O2 Saturation: 99.2 %
PEEP: 5 cmH2O
PEEP: 5 cmH2O
Patient temperature: 37
Patient temperature: 37
pCO2 arterial: 41 mmHg (ref 32–48)
pH, Arterial: 7.19 — CL (ref 7.35–7.45)
pH, Arterial: 7.44 (ref 7.35–7.45)
pO2, Arterial: 162 mmHg — ABNORMAL HIGH (ref 83–108)
pO2, Arterial: 89 mmHg (ref 83–108)

## 2023-01-25 LAB — CREATININE, SERUM
Creatinine, Ser: 0.73 mg/dL (ref 0.44–1.00)
GFR, Estimated: >60 mL/min (ref >60)

## 2023-01-25 LAB — RENAL FUNCTION PANEL
Albumin: 3.7 g/dL (ref 3.5–5.0)
Anion gap: 13 (ref 5–15)
BUN: 17 mg/dL (ref 8–23)
CO2: 20 mmol/L — ABNORMAL LOW (ref 22–32)
Calcium: 8.7 mg/dL — ABNORMAL LOW (ref 8.9–10.3)
Chloride: 106 mmol/L (ref 98–111)
Creatinine, Ser: 0.71 mg/dL (ref 0.44–1.00)
GFR, Estimated: >60 mL/min (ref >60)
Glucose, Bld: 146 mg/dL — ABNORMAL HIGH (ref 70–99)
Phosphorus: 4.2 mg/dL (ref 2.5–4.6)
Potassium: 4.8 mmol/L (ref 3.5–5.1)
Sodium: 139 mmol/L (ref 135–145)

## 2023-01-25 LAB — CBC
HCT: 39.7 % (ref 36.0–46.0)
Hemoglobin: 13.3 g/dL (ref 12.0–15.0)
MCH: 32.2 pg (ref 26.0–34.0)
MCHC: 33.5 g/dL (ref 30.0–36.0)
MCV: 96.1 fL (ref 80.0–100.0)
Platelets: 178 10*3/uL (ref 150–400)
RBC: 4.13 MIL/uL (ref 3.87–5.11)
RDW: 12.3 % (ref 11.5–15.5)
WBC: 9.4 10*3/uL (ref 4.0–10.5)
nRBC: 0 % (ref 0.0–0.2)

## 2023-01-25 LAB — GLUCOSE, CAPILLARY
Glucose-Capillary: 132 mg/dL — ABNORMAL HIGH (ref 70–99)
Glucose-Capillary: 164 mg/dL — ABNORMAL HIGH (ref 70–99)
Glucose-Capillary: 198 mg/dL — ABNORMAL HIGH (ref 70–99)

## 2023-01-25 LAB — MRSA NEXT GEN BY PCR, NASAL: MRSA by PCR Next Gen: NOT DETECTED

## 2023-01-25 SURGERY — BRONCHOSCOPY, WITH EBUS
Anesthesia: General

## 2023-01-25 MED ORDER — FAMOTIDINE 20 MG PO TABS
20.0000 mg | ORAL_TABLET | Freq: Two times a day (BID) | ORAL | Status: DC
  Administered 2023-01-25: 20 mg
  Filled 2023-01-25: qty 1

## 2023-01-25 MED ORDER — DEXAMETHASONE SODIUM PHOSPHATE 10 MG/ML IJ SOLN
INTRAMUSCULAR | Status: DC | PRN
  Administered 2023-01-25: 10 mg via INTRAVENOUS

## 2023-01-25 MED ORDER — PANTOPRAZOLE SODIUM 40 MG IV SOLR
40.0000 mg | Freq: Every day | INTRAVENOUS | Status: DC
  Administered 2023-01-25 – 2023-01-26 (×2): 40 mg via INTRAVENOUS
  Filled 2023-01-25 (×2): qty 10

## 2023-01-25 MED ORDER — CHLORHEXIDINE GLUCONATE CLOTH 2 % EX PADS
6.0000 | MEDICATED_PAD | Freq: Every day | CUTANEOUS | Status: DC
  Administered 2023-01-25 – 2023-01-28 (×4): 6 via TOPICAL

## 2023-01-25 MED ORDER — SUCCINYLCHOLINE 20MG/ML (10ML) SYRINGE FOR MEDFUSION PUMP - OPTIME
INTRAMUSCULAR | Status: DC | PRN
Start: 2023-01-25 — End: 2023-01-25
  Administered 2023-01-25: 80 mg via INTRAVENOUS

## 2023-01-25 MED ORDER — LIDOCAINE HCL (CARDIAC) PF 100 MG/5ML IV SOSY
PREFILLED_SYRINGE | INTRAVENOUS | Status: DC | PRN
  Administered 2023-01-25: 100 mg via INTRAVENOUS

## 2023-01-25 MED ORDER — ETOMIDATE 2 MG/ML IV SOLN
INTRAVENOUS | Status: DC | PRN
Start: 2023-01-25 — End: 2023-01-25
  Administered 2023-01-25: 20 mg via INTRAVENOUS

## 2023-01-25 MED ORDER — CHLORHEXIDINE GLUCONATE 0.12 % MT SOLN
OROMUCOSAL | Status: AC
  Filled 2023-01-25: qty 15

## 2023-01-25 MED ORDER — ACETAMINOPHEN 325 MG PO TABS: 650.0000 mg | ORAL_TABLET | ORAL | Status: DC | PRN

## 2023-01-25 MED ORDER — POLYETHYLENE GLYCOL 3350 17 G PO PACK
17.0000 g | PACK | Freq: Every day | ORAL | Status: DC
  Administered 2023-01-25: 17 g
  Filled 2023-01-25: qty 1

## 2023-01-25 MED ORDER — HEPARIN SODIUM (PORCINE) 5000 UNIT/ML IJ SOLN
5000.0000 [IU] | Freq: Three times a day (TID) | INTRAMUSCULAR | Status: DC
  Administered 2023-01-25 – 2023-01-26 (×2): 5000 [IU] via SUBCUTANEOUS
  Filled 2023-01-25 (×2): qty 1

## 2023-01-25 MED ORDER — ORAL CARE MOUTH RINSE
15.0000 mL | OROMUCOSAL | Status: DC
  Administered 2023-01-25 – 2023-01-26 (×7): 15 mL via OROMUCOSAL

## 2023-01-25 MED ORDER — DOCUSATE SODIUM 100 MG PO CAPS: 100.0000 mg | ORAL_CAPSULE | Freq: Two times a day (BID) | ORAL | Status: DC | PRN

## 2023-01-25 MED ORDER — IPRATROPIUM-ALBUTEROL 0.5-2.5 (3) MG/3ML IN SOLN
3.0000 mL | Freq: Once | RESPIRATORY_TRACT | Status: AC
  Administered 2023-01-25: 3 mL via RESPIRATORY_TRACT

## 2023-01-25 MED ORDER — SUCCINYLCHOLINE CHLORIDE 200 MG/10ML IV SOSY
PREFILLED_SYRINGE | INTRAVENOUS | Status: AC
  Filled 2023-01-25: qty 10

## 2023-01-25 MED ORDER — MIDAZOLAM HCL 2 MG/2ML IJ SOLN
INTRAMUSCULAR | Status: AC
  Filled 2023-01-25: qty 2

## 2023-01-25 MED ORDER — PHENYLEPHRINE 80 MCG/ML (10ML) SYRINGE FOR IV PUSH (FOR BLOOD PRESSURE SUPPORT)
PREFILLED_SYRINGE | INTRAVENOUS | Status: DC | PRN
  Administered 2023-01-25 (×2): 80 ug via INTRAVENOUS
  Administered 2023-01-25: 40 ug via INTRAVENOUS
  Administered 2023-01-25: 80 ug via INTRAVENOUS
  Administered 2023-01-25: 40 ug via INTRAVENOUS

## 2023-01-25 MED ORDER — LABETALOL HCL 5 MG/ML IV SOLN
INTRAVENOUS | Status: DC | PRN
Start: 2023-01-25 — End: 2023-01-25
  Administered 2023-01-25: 5 mg via INTRAVENOUS

## 2023-01-25 MED ORDER — FENTANYL CITRATE (PF) 100 MCG/2ML IJ SOLN
INTRAMUSCULAR | Status: DC | PRN
  Administered 2023-01-25 (×2): 50 ug via INTRAVENOUS

## 2023-01-25 MED ORDER — SODIUM CHLORIDE 0.9% FLUSH
3.0000 mL | Freq: Two times a day (BID) | INTRAVENOUS | Status: DC
  Administered 2023-01-25 – 2023-01-28 (×7): 3 mL via INTRAVENOUS

## 2023-01-25 MED ORDER — FENTANYL CITRATE PF 50 MCG/ML IJ SOSY
25.0000 ug | PREFILLED_SYRINGE | INTRAMUSCULAR | Status: DC | PRN
  Administered 2023-01-25 (×3): 50 ug via INTRAVENOUS

## 2023-01-25 MED ORDER — PHENYLEPHRINE 80 MCG/ML (10ML) SYRINGE FOR IV PUSH (FOR BLOOD PRESSURE SUPPORT)
PREFILLED_SYRINGE | INTRAVENOUS | Status: AC
  Filled 2023-01-25: qty 10

## 2023-01-25 MED ORDER — POLYETHYLENE GLYCOL 3350 17 G PO PACK: 17.0000 g | PACK | Freq: Every day | ORAL | Status: DC | PRN

## 2023-01-25 MED ORDER — FENTANYL 2500MCG IN NS 250ML (10MCG/ML) PREMIX INFUSION: 0.0000 ug/h | INTRAVENOUS | Status: DC

## 2023-01-25 MED ORDER — SODIUM CHLORIDE 0.9 % IV SOLN: 250.0000 mL | INTRAVENOUS | Status: DC | PRN

## 2023-01-25 MED ORDER — ETOMIDATE 2 MG/ML IV SOLN
INTRAVENOUS | Status: AC
  Filled 2023-01-25: qty 20

## 2023-01-25 MED ORDER — LABETALOL HCL 5 MG/ML IV SOLN
INTRAVENOUS | Status: AC
  Filled 2023-01-25: qty 4

## 2023-01-25 MED ORDER — LIDOCAINE HCL (PF) 2 % IJ SOLN
INTRAMUSCULAR | Status: AC
  Filled 2023-01-25: qty 5

## 2023-01-25 MED ORDER — IPRATROPIUM-ALBUTEROL 0.5-2.5 (3) MG/3ML IN SOLN: 3.0000 mL | RESPIRATORY_TRACT | Status: DC | PRN

## 2023-01-25 MED ORDER — MIDAZOLAM HCL 2 MG/2ML IJ SOLN
2.0000 mg | Freq: Once | INTRAMUSCULAR | Status: AC
  Administered 2023-01-25: 2 mg via INTRAVENOUS

## 2023-01-25 MED ORDER — ASPIRIN 81 MG PO CHEW: 324.0000 mg | CHEWABLE_TABLET | ORAL | Status: DC

## 2023-01-25 MED ORDER — IPRATROPIUM-ALBUTEROL 0.5-2.5 (3) MG/3ML IN SOLN
RESPIRATORY_TRACT | Status: AC
  Filled 2023-01-25: qty 3

## 2023-01-25 MED ORDER — PROPOFOL 10 MG/ML IV BOLUS
INTRAVENOUS | Status: AC
  Filled 2023-01-25: qty 20

## 2023-01-25 MED ORDER — ROCURONIUM BROMIDE 100 MG/10ML IV SOLN
INTRAVENOUS | Status: DC | PRN
  Administered 2023-01-25 (×2): 10 mg via INTRAVENOUS
  Administered 2023-01-25: 40 mg via INTRAVENOUS

## 2023-01-25 MED ORDER — OXYCODONE HCL 5 MG PO TABS: 5.0000 mg | ORAL_TABLET | Freq: Once | ORAL | Status: DC | PRN

## 2023-01-25 MED ORDER — OXYCODONE HCL 5 MG/5ML PO SOLN: 5.0000 mg | Freq: Once | ORAL | Status: DC | PRN

## 2023-01-25 MED ORDER — SODIUM CHLORIDE 0.9% FLUSH: 3.0000 mL | INTRAVENOUS | Status: DC | PRN

## 2023-01-25 MED ORDER — SUGAMMADEX SODIUM 200 MG/2ML IV SOLN
INTRAVENOUS | Status: DC | PRN
  Administered 2023-01-25: 120 mg via INTRAVENOUS

## 2023-01-25 MED ORDER — PROPOFOL 1000 MG/100ML IV EMUL
0.0000 ug/kg/min | INTRAVENOUS | Status: DC
  Administered 2023-01-25: 5 ug/kg/min via INTRAVENOUS
  Administered 2023-01-26: 15 ug/kg/min via INTRAVENOUS
  Filled 2023-01-25 (×2): qty 100

## 2023-01-25 MED ORDER — BUDESONIDE 0.5 MG/2ML IN SUSP
0.5000 mg | Freq: Two times a day (BID) | RESPIRATORY_TRACT | Status: DC
  Administered 2023-01-25: 0.5 mg via RESPIRATORY_TRACT
  Filled 2023-01-25: qty 2

## 2023-01-25 MED ORDER — DEXAMETHASONE SODIUM PHOSPHATE 10 MG/ML IJ SOLN
INTRAMUSCULAR | Status: AC
  Filled 2023-01-25: qty 1

## 2023-01-25 MED ORDER — IPRATROPIUM-ALBUTEROL 0.5-2.5 (3) MG/3ML IN SOLN
3.0000 mL | Freq: Four times a day (QID) | RESPIRATORY_TRACT | Status: DC
  Administered 2023-01-25 – 2023-01-28 (×10): 3 mL via RESPIRATORY_TRACT
  Filled 2023-01-25 (×10): qty 3

## 2023-01-25 MED ORDER — ROCURONIUM BROMIDE 10 MG/ML (PF) SYRINGE
PREFILLED_SYRINGE | INTRAVENOUS | Status: AC
  Filled 2023-01-25: qty 10

## 2023-01-25 MED ORDER — ONDANSETRON HCL 4 MG/2ML IJ SOLN
INTRAMUSCULAR | Status: DC | PRN
Start: 2023-01-25 — End: 2023-01-25
  Administered 2023-01-25: 4 mg via INTRAVENOUS

## 2023-01-25 MED ORDER — MUPIROCIN 2 % EX OINT: TOPICAL_OINTMENT | Freq: Two times a day (BID) | CUTANEOUS | Status: DC

## 2023-01-25 MED ORDER — FENTANYL CITRATE (PF) 100 MCG/2ML IJ SOLN: 25.0000 ug | INTRAMUSCULAR | Status: DC | PRN

## 2023-01-25 MED ORDER — FENTANYL CITRATE (PF) 100 MCG/2ML IJ SOLN
INTRAMUSCULAR | Status: AC
  Filled 2023-01-25: qty 2

## 2023-01-25 MED ORDER — FENTANYL 2500MCG IN NS 250ML (10MCG/ML) PREMIX INFUSION
INTRAVENOUS | Status: AC
  Administered 2023-01-25: 25 ug/h via INTRAVENOUS
  Filled 2023-01-25: qty 250

## 2023-01-25 MED ORDER — ASPIRIN 300 MG RE SUPP
300.0000 mg | RECTAL | Status: DC
  Filled 2023-01-25: qty 1

## 2023-01-25 MED ORDER — DOCUSATE SODIUM 50 MG/5ML PO LIQD
100.0000 mg | Freq: Two times a day (BID) | ORAL | Status: DC
  Administered 2023-01-25: 100 mg
  Filled 2023-01-25: qty 10

## 2023-01-25 MED ORDER — FENTANYL CITRATE PF 50 MCG/ML IJ SOSY: 25.0000 ug | PREFILLED_SYRINGE | INTRAMUSCULAR | Status: DC | PRN

## 2023-01-25 MED ORDER — ONDANSETRON HCL 4 MG/2ML IJ SOLN: 4.0000 mg | Freq: Four times a day (QID) | INTRAMUSCULAR | Status: DC | PRN

## 2023-01-25 MED ORDER — ORAL CARE MOUTH RINSE: 15.0000 mL | OROMUCOSAL | Status: DC | PRN

## 2023-01-25 MED ORDER — ONDANSETRON HCL 4 MG/2ML IJ SOLN
INTRAMUSCULAR | Status: AC
  Filled 2023-01-25: qty 2

## 2023-01-25 MED ORDER — PROPOFOL 10 MG/ML IV BOLUS
INTRAVENOUS | Status: DC | PRN
  Administered 2023-01-25: 80 mg via INTRAVENOUS

## 2023-01-25 NOTE — Anesthesia Preprocedure Evaluation (Addendum)
Anesthesia Evaluation  Patient identified by MRN, date of birth, ID band Patient awake    Reviewed: Allergy & Precautions, NPO status , Patient's Chart, lab work & pertinent test results  Airway Mallampati: III  TM Distance: >3 FB Neck ROM: full    Dental  (+) Upper Dentures, Lower Dentures   Pulmonary shortness of breath, COPD, former smoker   Pulmonary exam normal        Cardiovascular (-) angina + CAD  Normal cardiovascular exam     Neuro/Psych  PSYCHIATRIC DISORDERS      negative neurological ROS     GI/Hepatic Neg liver ROS, hiatal hernia,GERD  Controlled,,  Endo/Other  negative endocrine ROS    Renal/GU      Musculoskeletal  (+) Arthritis ,    Abdominal   Peds  Hematology negative hematology ROS (+)   Anesthesia Other Findings Hoarse voice at baseline preOp  Past Medical History: No date: Anxiety No date: Arthritis     Comment:  KNEE RIGHT 1997: Breast cancer (HCC)     Comment:  right breast lumpectomy with radiation 11/15/2017: Breast cancer (HCC)     Comment:  INVASIVE MAMMARY CARCINOMA/ triple negative 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) No date: COPD (chronic obstructive pulmonary disease) (HCC) No date: Coronary artery disease 03/02/2017: Cough     Comment:  INTERMTTENT DRY COUGH No date: Depression No date: Dyspnea No date: GERD (gastroesophageal reflux disease) No date: History of hiatal hernia No date: Personal history of chemotherapy No date: Personal history of radiation therapy No date: Skin cancer 1998: Throat cancer (HCC)     Comment:  radiation No date: Wears dentures     Comment:  full upper and lower  Past Surgical History: 01/05/2023: BIOPSY     Comment:  Procedure: BIOPSY;  Surgeon: Toney Reil, MD;                Location: [facility];  Service: Gastroenterology;; 1997: BREAST BIOPSY; Right     Comment:  positive 11/15/2017: BREAST BIOPSY; Right      Comment:  INVASIVE MAMMARY CARCINOMA triple negative 07/20/2020: BREAST BIOPSY; Right     Comment:  [place] bx, Q marker, neg 1997: BREAST LUMPECTOMY; Right     Comment:  2019 also 12/08/2017: BREAST LUMPECTOMY WITH SENTINEL LYMPH NODE BIOPSY; Right     Comment:  Procedure: BREAST LUMPECTOMY WITH SENTINEL LYMPH NODE               BX;  Surgeon: Earline Mayotte, MD;  Location: [facility]               ORS;  Service: General;  Laterality: Right; 06/10/2019: BREAST REDUCTION WITH MASTOPEXY; Left     Comment:  Procedure: Left breast mastopexy reduction for symmetry;              Surgeon: Peggye Form, DO;  Location: [place]               SURGERY CENTER;  Service: Plastics;  Laterality: Left;                total 2.5 hours 02/07/2022: CATARACT EXTRACTION W/PHACO; Right     Comment:  Procedure: CATARACT EXTRACTION PHACO AND INTRAOCULAR               LENS PLACEMENT (IOC) RIGHT DIABETIC;  Surgeon: Nevada Crane, MD;  Location: [facility];  Service: Ophthalmology;  Laterality: Right;                3.60 00:34.7 02/28/2022: CATARACT EXTRACTION W/PHACO; Left     Comment:  Procedure: CATARACT EXTRACTION PHACO AND INTRAOCULAR               LENS PLACEMENT (IOC) LEFT DIABETIC 4.11 00:26.1;                Surgeon: Nevada Crane, MD;  Location: [facility]               SURGERY CNTR;  Service: Ophthalmology;  Laterality: Left; 2015: COLONOSCOPY 01/05/2023: COLONOSCOPY; N/A     Comment:  Procedure: COLONOSCOPY;  Surgeon: Toney Reil,               MD;  Location: [facility];  Service:               Gastroenterology;  Laterality: N/A; 10/29/2019: COLONOSCOPY WITH PROPOFOL; N/A     Comment:  Procedure: COLONOSCOPY WITH PROPOFOL;  Surgeon: Midge Minium, MD;  Location: [facility];  Service:               Endoscopy;  Laterality: N/A; 05/24/2019: ESOPHAGOGASTRODUODENOSCOPY (EGD) WITH PROPOFOL; N/A     Comment:  Procedure:  ESOPHAGOGASTRODUODENOSCOPY (EGD) WITH               PROPOFOL;  Surgeon: Midge Minium, MD;  Location: [facility]               ENDOSCOPY;  Service: Endoscopy;  Laterality: N/A; 09/06/2022: ESOPHAGOGASTRODUODENOSCOPY (EGD) WITH PROPOFOL; N/A     Comment:  Procedure: ESOPHAGOGASTRODUODENOSCOPY (EGD) WITH               PROPOFOL;  Surgeon: Midge Minium, MD;  Location: [facility]               ENDOSCOPY;  Service: Endoscopy;  Laterality: N/A; 01/05/2023: ESOPHAGOGASTRODUODENOSCOPY (EGD) WITH PROPOFOL     Comment:  Procedure: ESOPHAGOGASTRODUODENOSCOPY (EGD) WITH               PROPOFOL;  Surgeon: Toney Reil, MD;  Location:               [facility];  Service: Gastroenterology;; No date: ESOPHAGUS SURGERY No date: KNEE SURGERY; Right 06/10/2019: LIPOSUCTION WITH LIPOFILLING; Right     Comment:  Procedure: right breast scar and capsule release with               fat grafting;  Surgeon: Peggye Form, DO;                Location: [facility];  Service: Plastics;               Laterality: Right; 01/05/2023: POLYPECTOMY     Comment:  Procedure: POLYPECTOMY;  Surgeon: Toney Reil,               MD;  Location: [facility];  Service:               Gastroenterology;; No date: PORT-A-CATH REMOVAL 01/17/2018: PORTACATH PLACEMENT; Right     Comment:  Procedure: INSERTION PORT-A-CATH- RIGHT;  Surgeon:               Earline Mayotte, MD;  Location: [facility];  Service:               General;  Laterality: Right;  12/28/2016: ROBOTIC ADRENALECTOMY; Right     Comment:  Procedure: ROBOTIC ADRENALECTOMY;  Surgeon: Vanna Scotland, MD;  Location: [facility] OR[facility];  Service: Urology;                Laterality: Right; No date: SKIN CANCER EXCISION 1998: THROAT SURGERY     Comment:  throat cancer  No date: TUBAL LIGATION 03/03/2017: VENTRAL HERNIA REPAIR; N/A     Comment:  Procedure: HERNIA REPAIR VENTRAL ADULT;  Surgeon:               Ricarda Frame, MD;  Location: [facility] ORS;   Service:               General;  Laterality: N/A;  BMI    Body Mass Index: 24.94 kg/m      Reproductive/Obstetrics negative OB ROS                             Anesthesia Physical Anesthesia Plan  ASA: 3  Anesthesia Plan: General ETT   Post-op Pain Management:    Induction: Intravenous  PONV Risk Score and Plan: Ondansetron, Dexamethasone, Midazolam and Treatment may vary due to age or medical condition  Airway Management Planned: Oral ETT  Additional Equipment:   Intra-op Plan:   Post-operative Plan: Extubation in OR  Informed Consent: I have reviewed the patients History and Physical, chart, labs and discussed the procedure including the risks, benefits and alternatives for the proposed anesthesia with the patient or authorized representative who has indicated his/her understanding and acceptance.     Dental Advisory Given  Plan Discussed with: Anesthesiologist, CRNA and Surgeon  Anesthesia Plan Comments: (Patient consented for risks of anesthesia including but not limited to:  - adverse reactions to medications - damage to eyes, teeth, lips or other oral mucosa - nerve damage due to positioning  - sore throat or hoarseness - Damage to heart, brain, nerves, lungs, other parts of body or loss of life  Patient voiced understanding.)       Anesthesia Quick Evaluation

## 2023-01-25 NOTE — Procedures (Signed)
NAVIGATIONAL BRONCHOSCOPY PROCEDURE NOTE  FIBEROPTIC BRONCHOSCOPY WITH BRONCHOALVEOLAR LAVAGE and THERAPEUTIC ASPIRATION OF TRACHEOBRONCHIAL TREE PROCEDURE NOTE    Flexible bronchoscopy was performed  by : Karna Christmas MD  assistance by : 1)Repiratory therapist  and 2)[employer] staff and 3) Anesthesia team and 4) Flouroscopy team and 5) [facility] supporting staff   Indication for the procedure was :  Pre-procedural H&P. The following assessment was performed on the day of the procedure prior to initiating sedation History:  Chest pain n Dyspnea y Hemoptysis n Cough y Fever n Other pertinent items n  Examination Vital signs -reviewed as per nursing documentation today Cardiac    Murmurs: n  Rubs : n  Gallop: n Lungs Wheezing: n Rales : n Rhonchi :y  Other pertinent findings: SOB/hypoxemia due to chronic lung disease   Pre-procedural assessment for Procedural Sedation included: Depth of sedation: As per anesthesia team  ASA Classification:  2 Mallampati airway assessment: 3    Medication list reviewed: y  The patient's interval history was taken and revealed: no new complaints The pre- procedure physical examination revealed: No new findings Refer to prior clinic note for details.  Informed Consent: Informed consent was obtained from:  patient after explanation of procedure and risks, benefits, as well as alternative procedures available.  Explanation of level of sedation and possible transfusion was also provided.    Procedural Preparation: Time out was performed and patient was identified by name and birthdate and procedure to be performed and side for sampling, if any, was specified. Pt was intubated by anesthesia.  The patient was appropriately draped.   Fiberoptic bronchoscopy with airway inspection and BAL Procedure findings:  Bronchoscope was inserted via ETT  without difficulty.  Posterior oropharynx, epiglottis, arytenoids, false cords and vocal cords were  not visualized as these were bypassed by endotracheal tube. The distal trachea was normal in circumference and appearance without mucosal, cartilaginous or branching abnormalities.  The main carina was mildly splayed . All right and left lobar airways were visualized to the Subsegmental level.  Sub- sub segmental carinae were identified in all the distal airways.   Secretions were visible in the following airways and appeared to be clear.  The mucosa was : FRIABLE AND EDEMATOUS BILATERALLY WORSE AT LEFT LUNG  Airways were notable for:        exophytic lesions :n       extrinsic compression in the following distributions: n.       Friable mucosa: y       Teacher, music /pigmentation: n   MUCUS PLUGGING WAS NOTED AT LEFT UPPER LOBE AND RIGHT UPPER LOBE , THESE WERE BOTH TREATED WITH THERAPEUTIC ASPIRATION X 3.   Post procedure Diagnosis:   EDEMATOUS FRIABLE LUNGS WITH MUCUS PLUGGING     Electromagnetic Navigational Bronchoscopy Procedure Findings:     Post appropriate planning and registration peripheral navigation was used to visualize target lesion.    3 TARGETS WERE PLANNED AND BIOPSIED AS BELOW:   TARGET #1 - APICAL LEFT UPPER LOBE NODULE - SURGICAL ENDOBRONCHIAL BIOPSY X 3  TARGET #2- NON APICAL LEFT UPPER LOBE NODULE CLOSE TO PLEURA - CYTOBRUSH X 1, FNA X2 , SURGICAL ENDOBRONCHIAL X 5== ATYPICAL CELLS CONSISTENT WITH CARCINOMA PER CYTOTECH  TARGET #3 -LEFT LOWER LOBE LOW AVIDITY LESION - SURGICAL ENDOBRONCHIAL BIOPSY X4 - ATYPICAL CELLS NOTED BY CYTOTECH   Post procedure diagnosis: POSSIBLE LUNG CANCER BASED ON ATYPICAL CELLS NOTED BY CYTOTECH      Endobronchial ultrasound assisted hilar and mediastinal  lymph node biopsies procedure findings: The fiberoptic bronchoscope was removed  Due to malfunction of bronchoscopy cart , EBUS had no electrical power and was unable to be performed.      Specimens obtained included:  Broncho-alveolar lavage site:left upper  lobe  sent for cytology                              20ml volume infused 10ml volume returned with cellular and bloody appearance    Immediate sampling complications included:NONE IMMEDIATE Epinephrine ZERO ml was used topically  The bronchoscopy was terminated due to completion of the planned procedure and the bronchoscope was removed.   Total dosage of Lidocaine was ZERO mg Total fluoroscopy time was AS PER RADIOLOGY  minutes  Supplemental oxygen was provided at AS PER ANESTHESIA lpm by nasal canula post operatively  Estimated Blood loss: <5cc.  Complications included:  NONE IMMEDIATE   Preliminary CXR findings :  Left pneumothorax.  Patient was re-intubated due to pneumothorax and increased O2 req. Discussed with husband and we anticipated high possibility prior to procedure and discussed this possibly happening due to advanced copd with apical and pleural lesions.  Husband is thankful and agreeable to pleural catheter placement to reduce pneumothorax and keep patient overnight in ICU overnight.   Disposition: hospital until resolution of pneumothorax  Follow up with Dr. Karna Christmas in 5 days for result discussion.     Vida Rigger MD  [facility] Division of Pulmonary & Critical Care Medicine

## 2023-01-25 NOTE — Anesthesia Postprocedure Evaluation (Signed)
Anesthesia Post Note  Patient: Denise Watts  Procedure(s) Performed: VIDEO BRONCHOSCOPY WITH ENDOBRONCHIAL ULTRASOUND ROBOTIC ASSISTED NAVIGATIONAL BRONCHOSCOPY  Patient location during evaluation: ICU Anesthesia Type: General Level of consciousness: sedated Pain management: pain level controlled Vital Signs Assessment: post-procedure vital signs reviewed and stable Respiratory status: patient re-intubated and patient on ventilator - see flowsheet for VS Cardiovascular status: blood pressure returned to baseline Anesthetic complications: yes Comments: Emergently intubated in PACU for hypoxia to 88%, dropping quickly, and failure to follow commands.  Transported to ICU.   Encounter Notable Events  Notable Event Outcome Phase Comment  Unplanned reintubation Unresolved In Recovery Hypoxia and AMS requiring intubation in PACU     Last Vitals:  Vitals:   01/25/23 1521 01/25/23 1526  BP:  110/77  Pulse: 77   Resp: 19 18  Temp:  36.7 C  SpO2: 100%     Last Pain:  Vitals:   01/25/23 1526  TempSrc: Axillary  PainSc:                  Reed Breech

## 2023-01-25 NOTE — H&P (Signed)
PULMONOLOGY         Date: 01/25/2023,   MRN# [id number] Denise Watts 01-Jun-1955     AdmissionWeight: 59.9 kg                 CurrentWeight: 59.9 kg  Referring provider: Dr Smith Robert   CHIEF COMPLAINT:   Multiple lung nodules with hx of vocal cord cancer and breast cancer   HISTORY OF PRESENT ILLNESS   This is a pleasant 67 year old female with a history of anxiety disorder, right knee osteoarthritis, COPD, coronary artery disease, chronic cough, major depressive disorder, chronic dyspnea, GERD, history of hiatal hernia, history of vocal cord cancer and breast cancer status post medical oncology evaluation with full scope of therapy including radiation.  Recently noted to have lung nodules worse on the left with subsequent evaluation via PET scan noting hypermetabolic lesions of the left upper lobe as well as low avidity of left lower lobe lesion.  Today patient is here for bronchoscopic airway evaluation including lung biopsy of multiple abnormal lesions as described in recent PET scan.  Reviewed risks/complications and benefits with patient, risks include infection, pneumothorax/pneumomediastinum which may require chest tube placement as well as overnight/prolonged hospitalization and possible mechanical ventilation. Other risks include bleeding and very rarely death.  Patient understands risks and wishes to proceed.  Additional questions were answered, and patient is aware that post procedure patient will be going home with family and may experience cough with possible clots on expectoration as well as phlegm which may last few days as well as hoarseness of voice post intubation and mechanical ventilation.    PAST MEDICAL HISTORY   Past Medical History:  Diagnosis Date   Anxiety    Arthritis    KNEE RIGHT   Breast cancer (HCC) 1997   right breast lumpectomy with radiation   Breast cancer (HCC) 11/15/2017   INVASIVE MAMMARY CARCINOMA/ triple negative   Cancer of vocal cord  (HCC) 11/10/2014   COPD (chronic obstructive pulmonary disease) (HCC)    Coronary artery disease    Cough 03/02/2017   INTERMTTENT DRY COUGH   Depression    Dyspnea    GERD (gastroesophageal reflux disease)    History of hiatal hernia    Personal history of chemotherapy    Personal history of radiation therapy    Skin cancer    Throat cancer (HCC) 1998   radiation   Wears dentures    full upper and lower     SURGICAL HISTORY   Past Surgical History:  Procedure Laterality Date   BIOPSY  01/05/2023   Procedure: BIOPSY;  Surgeon: Toney Reil, MD;  Location: [facility];  Service: Gastroenterology;;   BREAST BIOPSY Right 1997   positive   BREAST BIOPSY Right 11/15/2017   INVASIVE MAMMARY CARCINOMA triple negative   BREAST BIOPSY Right 07/20/2020   [place] bx, Q marker, neg   BREAST LUMPECTOMY Right 1997   2019 also   BREAST LUMPECTOMY WITH SENTINEL LYMPH NODE BIOPSY Right 12/08/2017   Procedure: BREAST LUMPECTOMY WITH SENTINEL LYMPH NODE BX;  Surgeon: Earline Mayotte, MD;  Location: [facility];  Service: General;  Laterality: Right;   BREAST REDUCTION WITH MASTOPEXY Left 06/10/2019   Procedure: Left breast mastopexy reduction for symmetry;  Surgeon: Peggye Form, DO;  Location: [facility];  Service: Plastics;  Laterality: Left;  total 2.5 hours   CATARACT EXTRACTION W/PHACO Right 02/07/2022   Procedure: CATARACT EXTRACTION PHACO AND INTRAOCULAR LENS PLACEMENT (IOC)  RIGHT DIABETIC;  Surgeon: Nevada Crane, MD;  Location: [facility];  Service: Ophthalmology;  Laterality: Right;  3.60 00:34.7   CATARACT EXTRACTION W/PHACO Left 02/28/2022   Procedure: CATARACT EXTRACTION PHACO AND INTRAOCULAR LENS PLACEMENT (IOC) LEFT DIABETIC 4.11 00:26.1;  Surgeon: Nevada Crane, MD;  Location: [facility];  Service: Ophthalmology;  Laterality: Left;   COLONOSCOPY  2015   COLONOSCOPY N/A 01/05/2023   Procedure: COLONOSCOPY;  Surgeon: Toney Reil, MD;  Location: [facility];  Service: Gastroenterology;  Laterality: N/A;   COLONOSCOPY WITH PROPOFOL N/A 10/29/2019   Procedure: COLONOSCOPY WITH PROPOFOL;  Surgeon: Midge Minium, MD;  Location: [facility] END[facility];  Service: Endoscopy;  Laterality: N/A;   ESOPHAGOGASTRODUODENOSCOPY (EGD) WITH PROPOFOL N/A 05/24/2019   Procedure: ESOPHAGOGASTRODUODENOSCOPY (EGD) WITH PROPOFOL;  Surgeon: Midge Minium, MD;  Location: [facility];  Service: Endoscopy;  Laterality: N/A;   ESOPHAGOGASTRODUODENOSCOPY (EGD) WITH PROPOFOL N/A 09/06/2022   Procedure: ESOPHAGOGASTRODUODENOSCOPY (EGD) WITH PROPOFOL;  Surgeon: Midge Minium, MD;  Location: [facility];  Service: Endoscopy;  Laterality: N/A;   ESOPHAGOGASTRODUODENOSCOPY (EGD) WITH PROPOFOL  01/05/2023   Procedure: ESOPHAGOGASTRODUODENOSCOPY (EGD) WITH PROPOFOL;  Surgeon: Toney Reil, MD;  Location: [facility];  Service: Gastroenterology;;   ESOPHAGUS SURGERY     KNEE SURGERY Right    LIPOSUCTION WITH LIPOFILLING Right 06/10/2019   Procedure: right breast scar and capsule release with fat grafting;  Surgeon: Peggye Form, DO;  Location: [facility];  Service: Plastics;  Laterality: Right;   POLYPECTOMY  01/05/2023   Procedure: POLYPECTOMY;  Surgeon: Toney Reil, MD;  Location: [facility] END[facility];  Service: Gastroenterology;;   [facility] REMOVAL     PORTACATH PLACEMENT Right 01/17/2018   Procedure: INSERTION PORT-A-CATH- RIGHT;  Surgeon: Earline Mayotte, MD;  Location: [facility];  Service: General;  Laterality: Right;   ROBOTIC ADRENALECTOMY Right 12/28/2016   Procedure: ROBOTIC ADRENALECTOMY;  Surgeon: Vanna Scotland, MD;  Location: [facility];  Service: Urology;  Laterality: Right;   SKIN CANCER EXCISION     THROAT SURGERY  1998   throat cancer    TUBAL LIGATION     VENTRAL HERNIA REPAIR N/A 03/03/2017   Procedure: HERNIA REPAIR VENTRAL ADULT;  Surgeon: Ricarda Frame, MD;  Location: [facility];   Service: General;  Laterality: N/A;     FAMILY HISTORY   Family History  Problem Relation Age of Onset   Diabetes Mother    Heart attack Mother    Cancer Sister        cancer of eyes, spread to lung, other places. died at 32   Cancer Sister 1       unknown primary, spread to bone, brain died in 72's   Cancer Paternal Aunt        unk type   Cancer Paternal Grandmother        type unk   Prostate cancer Neg Hx    Kidney cancer Neg Hx    Bladder Cancer Neg Hx    Breast cancer Neg Hx      SOCIAL HISTORY   Social History   Tobacco Use   Smoking status: Former    Current packs/day: 0.00    Average packs/day: 1.5 packs/day for 30.0 years (45.0 ttl pk-yrs)    Types: Cigarettes    Start date: 05/02/1965    Quit date: 05/03/1995    Years since quitting: 27.7    Passive exposure: Past   Smokeless tobacco: Never  Vaping Use   Vaping status: Never Used  Substance Use Topics   Alcohol use: Not Currently    Alcohol/week: 4.0 standard drinks of alcohol    Types: 4 Shots of liquor per week    Comment: none   Drug use: No     MEDICATIONS    Home Medication:    Current Medication:  Current Facility-Administered Medications:    lactated ringers infusion, , Intravenous, Continuous, Corinda Gubler, MD, Last Rate: 10 mL/hr at 01/25/23 1202, Continued from Pre-op at 01/25/23 1202  Facility-Administered Medications Ordered in Other Encounters:    cyanocobalamin ((VITAMIN B-12)) injection 1,000 mcg, 1,000 mcg, Intramuscular, Q30 days, Creig Hines, MD, 1,000 mcg at 08/27/21 1537   cyanocobalamin (VITAMIN B12) injection 1,000 mcg, 1,000 mcg, Intramuscular, Q30 days, Creig Hines, MD, 1,000 mcg at 03/16/22 1101    ALLERGIES   Sulfa antibiotics     REVIEW OF SYSTEMS    Review of Systems:  Gen:  Denies  fever, sweats, chills weigh loss  HEENT: Denies blurred vision, double vision, ear pain, eye pain, hearing loss, nose bleeds, sore throat Cardiac:  No dizziness, chest  pain or heaviness, chest tightness,edema Resp:   reports dyspnea chronically  Gi: Denies swallowing difficulty, stomach pain, nausea or vomiting, diarrhea, constipation, bowel incontinence Gu:  Denies bladder incontinence, burning urine Ext:   Denies Joint pain, stiffness or swelling Skin: Denies  skin rash, easy bruising or bleeding or hives Endoc:  Denies polyuria, polydipsia , polyphagia or weight change Psych:   Denies depression, insomnia or hallucinations   Other:  All other systems negative   VS: BP 139/75   Pulse 92   Temp 97.8 F (36.6 C) (Oral)   Resp 18   Ht 5\' 1"  (1.549 m)   Wt 59.9 kg   SpO2 94%   BMI 24.94 kg/m      PHYSICAL EXAM    GENERAL:NAD, no fevers, chills, no weakness no fatigue HEAD: Normocephalic, atraumatic.  EYES: Pupils equal, round, reactive to light. Extraocular muscles intact. No scleral icterus.  MOUTH: Moist mucosal membrane. Dentition intact. No abscess noted.  EAR, NOSE, THROAT: Clear without exudates. No external lesions.  NECK: Supple. No thyromegaly. No nodules. No JVD.  PULMONARY: decreased breath sounds with mild rhonchi worse at bases bilaterally.  CARDIOVASCULAR: S1 and S2. Regular rate and rhythm. No murmurs, rubs, or gallops. No edema. Pedal pulses 2+ bilaterally.  GASTROINTESTINAL: Soft, nontender, nondistended. No masses. Positive bowel sounds. No hepatosplenomegaly.  MUSCULOSKELETAL: No swelling, clubbing, or edema. Range of motion full in all extremities.  NEUROLOGIC: Cranial nerves II through XII are intact. No gross focal neurological deficits. Sensation intact. Reflexes intact.  SKIN: No ulceration, lesions, rashes, or cyanosis. Skin warm and dry. Turgor intact.  PSYCHIATRIC: Mood, affect within normal limits. The patient is awake, alert and oriented x 3. Insight, judgment intact.       IMAGING   Reviewed PET scan and CT chest from Sept 2024  ASSESSMENT/PLAN   Multiple lung nodules with hilar adenopathy     -  patient presents per request of medical oncology for biopsy of hypermetabolic lesion of the left lung.  This was initially evaluated by interventional radiology and they attempted to do biopsy however this was traumatic with bleeding and was unsuccessful.  Subsequent request to perform lung biopsy via bronchoscopic approach.  Plan today is for robotic bronchoscopy for peripheral lung lesions as well as endobronchial ultrasound assisted lymph node biopsies.  Patient has significant risk factors for pneumothorax as well as bleeding including previous traumatization to  this lesion of interest.  Patient understands that she may have pneumothorax/bleeding and is agreeable to staying in the hospital if necessary for observation and treatment or any additional complications.  -Reviewed risks/complications and benefits with patient, risks include infection, pneumothorax/pneumomediastinum which may require chest tube placement as well as overnight/prolonged hospitalization and possible mechanical ventilation. Other risks include bleeding and very rarely death.  Patient understands risks and wishes to proceed.  Additional questions were answered, and patient is aware that post procedure patient will be going home with family and may experience cough with possible clots on expectoration as well as phlegm which may last few days as well as hoarseness of voice post intubation and mechanical ventilation.            Thank you for allowing me to participate in the care of this patient.   Patient/Family are satisfied with care plan and all questions have been answered.    Provider disclosure: Patient with at least one acute or chronic illness or injury that poses a threat to life or bodily function and is being managed actively during this encounter.  All of the below services have been performed independently by signing provider:  review of prior documentation from internal and or external health records.   Review of previous and current lab results.  Interview and comprehensive assessment during patient visit today. Review of current and previous chest radiographs/CT scans. Discussion of management and test interpretation with health care team and patient/family.   This document was prepared using Dragon voice recognition software and may include unintentional dictation errors.     Vida Rigger, M.D.  Division of Pulmonary & Critical Care Medicine

## 2023-01-25 NOTE — Progress Notes (Signed)
Patient arrived emergently from PACU, Dr. Karna Christmas at bedside for emergent CT insertion.

## 2023-01-25 NOTE — Plan of Care (Signed)

## 2023-01-25 NOTE — Transfer of Care (Signed)
Immediate Anesthesia Transfer of Care Note  Patient: Denise Watts  Procedure(s) Performed: VIDEO BRONCHOSCOPY WITH ENDOBRONCHIAL ULTRASOUND ROBOTIC ASSISTED NAVIGATIONAL BRONCHOSCOPY  Patient Location: PACU  Anesthesia Type:General  Level of Consciousness: Awake, drowsy  Airway & Oxygen Therapy: Patient Spontanous Breathing and Patient connected to face mask oxygen  Post-op Assessment: Report given to RN and Post -op Vital signs reviewed and stable  Post vital signs: Reviewed and stable  Last Vitals:  Vitals Value Taken Time  BP 105/73 01/25/23 1454  Temp 36.8 C 01/25/23 1454  Pulse 73 01/25/23 1458  Resp 17 01/25/23 1458  SpO2 92 % 01/25/23 1457  Vitals shown include unfiled device data. Pt. Sp02 sat 91-92 %, Rhonchi breath sounds, very congestive cough.  Dr. Ronni Rumble notified.   Last Pain:  Vitals:   01/25/23 1110  TempSrc: Oral  PainSc: 4          Complications: No notable events documented.

## 2023-01-25 NOTE — Anesthesia Procedure Notes (Signed)
Procedure Name: Intubation Date/Time: 01/25/2023 12:46 PM  Performed by: Morene Crocker, CRNAPre-anesthesia Checklist: Patient identified, Patient being monitored, Timeout performed, Emergency Drugs available and Suction available Patient Re-evaluated:Patient Re-evaluated prior to induction Oxygen Delivery Method: Circle system utilized Preoxygenation: Pre-oxygenation with 100% oxygen Induction Type: IV induction Ventilation: Mask ventilation without difficulty Laryngoscope Size: 3 and McGraph Grade View: Grade I Tube type: Oral Tube size: 8.5 mm Number of attempts: 1 Airway Equipment and Method: Stylet Placement Confirmation: ETT inserted through vocal cords under direct vision, positive ETCO2 and breath sounds checked- equal and bilateral Secured at: 21 cm Tube secured with: Tape Dental Injury: Teeth and Oropharynx as per pre-operative assessment  Comments: Smooth atraumatic placement of ETT, no complications noted

## 2023-01-25 NOTE — Patient Outreach (Signed)
Care Coordination   01/25/2023 Name: Denise Watts MRN: [id number] DOB: 19-Mar-1956   Care Coordination Outreach Attempts:  An unsuccessful telephone outreach was attempted today to offer the patient information about available care coordination services.  Follow Up Plan:  Additional outreach attempts will be made to offer the patient care coordination information and services.   Encounter Outcome:  No Answer   Care Coordination Interventions:  No, not indicated    Daryn Pisani, LCSW [place]  [facility], [facility] Licensed Clinical Social Worker Care Coordinator  Direct Dial: [phone]

## 2023-01-25 NOTE — Procedures (Signed)
Chest Tube Procedure Note   INDICATION:  Left pneumothroax PROCEDURE OPERATOR: Karna Christmas ATTENDING PHYSICIAN: Olimpia Tinch   CONSENT:   Consent was obtained from  husband Amyla Hainsworth prior to the procedure. Indications, risks, and benefits were explained at length.     PROCEDURE SUMMARY:   A time out was performed and after the chest x-ray was reviewed, the appropriate side was confirmed and marked. My hands were washed immediately prior to the procedure. I wore a surgical cap, mask with protective eyewear, sterile gown and sterile gloves throughout the procedure. The patient was prepped and draped in a sterile manner using chlorhexidine scrub after the patient was positioned in the usual fashion. A total of 3 ml of 1% lidocaine was used to anesthesize the skin, subcutaneous tissue, superior aspect of the rib periosteum and parietal pleura. A 5mm incision was then made parallel to the rib in the midaxillary line at the level of the 4th rib. 50F catheter is planned via Seldinger technique.. The pleura was then entered via wire guide and then dilator. Air was noted from the pleural space. . A 50French pigtail chest tube was then inserted using seldinger tecnique. The chest tube was directed posteriorly and inserted easily. The chest tube was sutured to the skin at the insertion site, and connected securely with tape to a pleurovac. A sterile occlusive dressing was placed over the insertion site. No immediate complications were noted. A post-procedure chest x-ray is pending at the time of this note.  Estimated blood loss is <1cc.  Post procedure CXR was done with reduction of pneumothorax    Vida Rigger, M.D.  Pulmonary & Critical Care Medicine  [facility]

## 2023-01-25 NOTE — Consult Note (Signed)
NAME:  Denise Watts, MRN:  [id number], DOB:  25-Apr-1956, LOS: 0 ADMISSION DATE:  01/25/2023, CONSULTATION DATE:  01/25/2023 REFERRING MD:  Dr. Karna Christmas, CHIEF COMPLAINT:  Acute Hypoxic Respiratory Failure, Pneumothorax   Brief Pt Description / Synopsis:  67 y.o female with PMHx significant for COPD, vocal cord and breast cancer who underwent elective Bronchoscopy for evaluation of lung nodules.  Procedure complicated by development of acute large left sided Pneumothorax with acute hypoxic respiratory failure requiring intubation, mechanical ventilation, and chest tube placement.  History of Present Illness:  Denise Watts is a 67 year old female with a past medical history significant for COPD, chronic cough, CAD, hiatal hernia, vocal cord cancer, breast cancer, anxiety disorder, major depressive disorder who presented to [facility] on 01/25/2023 for elective bronchoscopy for evaluation of lung nodules.  She has been following with oncology for ongoing evaluation and treatment of vocal cord and breast cancer.  She was recently noted to have lung nodules worse on the left.  She underwent PET scan noting hypermetabolic lesions of the left upper lobe as well as low avidity of left lower lobe lesion.  IR attempted to do biopsy, however was traumatic with bleeding and was unsuccessful, therefore she was referred for follow-up with Pulmonology to attempt lung biopsy via bronchoscopic approach.  In PACU following bronchoscopy she was noted to be hypoxic with O2 sats dropping to 88%.  X-ray was notable for large left-sided pneumothorax.  She was reintubated by anesthesia, and transferred to ICU.  Dr. Karna Christmas  placed emergent chest tube.  PCCM asked to consult for ventilator management.  Please see "significant hospital events" section below for full detailed hospital course.   Pertinent  Medical History   Past Medical History:  Diagnosis Date   Anxiety    Arthritis    KNEE RIGHT   Breast cancer (HCC) 1997    right breast lumpectomy with radiation   Breast cancer (HCC) 11/15/2017   INVASIVE MAMMARY CARCINOMA/ triple negative   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HCC) 11/10/2014   COPD (chronic obstructive pulmonary disease) (HCC)    Coronary artery disease    Cough 03/02/2017   INTERMTTENT DRY COUGH   Depression    Dyspnea    GERD (gastroesophageal reflux disease)    History of hiatal hernia    Personal history of chemotherapy    Personal history of radiation therapy    Skin cancer    Throat cancer (HCC) 1998   radiation   Wears dentures    full upper and lower     Micro Data:  N/A  Antimicrobials:   Anti-infectives (From admission, onward)    None       Significant Hospital Events: Including procedures, antibiotic start and stop dates in addition to other pertinent events   9/25: Presented for elective Bronchoscopy and lung biopsy.  Procedure complicated by large left sided PTX and acute hypoxic respiratory failure requiring intubation in PACU.  Dr. Karna Christmas placing emergent chest tube.  Transfer to ICU, PCCM consulted.  Interim History / Subjective:  -Patient seen in ICU post intubation and chest tube placement -Lightly sedated, arouses easily and follows commands nods to questions -Hemodynamically stable, no vasopressors -X-ray following chest tube placement shows improvement in pneumothorax  Objective   Blood pressure 138/83, pulse 78, temperature 98.2 F (36.8 C), resp. rate 15, height 5\' 1"  (1.549 m), weight 59.9 kg, SpO2 100%.        Intake/Output Summary (Last 24 hours) at 01/25/2023 1516 Last data filed at 01/25/2023  1448 Gross per 24 hour  Intake 700 ml  Output 0 ml  Net 700 ml   Filed Weights   01/25/23 1110  Weight: 59.9 kg    Examination: General: Acute on chronically ill-appearing female, laying in bed, intubated sedated, no acute distress HENT: Atraumatic, normocephalic, neck supple, no JVD Lungs: Coarse rhonchi throughout, even, nonlabored, normal  effort, synchronous with the vent Cardiovascular: Regular rate and rhythm, S1-S2, no murmurs, rubs, gallops Abdomen: Soft, nontender, nondistended, no guarding rebound tenderness, bowel sounds positive x 4 Extremities: Normal bulk and tone, no deformities, no edema Neuro: Lightly sedated, arouses to voice and follows commands and nods to questions, no focal deficits noted, pupils PERRLA GU: External female catheter in place  Resolved Hospital Problem list     Assessment & Plan:   #Acute Hypoxic Respiratory Failure  #Acute Pneumothorax following Bronchoscopy with lung biopsy #COPD without acute exacerbation PMHx: COPD, Multiple lung nodules with hx of vocal cord and breast cancer -Full vent support, implement lung protective strategies -Plateau pressures less than 30 cm H20 -Wean FiO2 & PEEP as tolerated to maintain O2 sats 88% to 92% -Follow intermittent Chest X-ray & ABG as needed -Spontaneous Breathing Trials when respiratory parameters met and mental status permits -Implement VAP Bundle -Bronchodilators & Pulmicort nebs -Emergent chest tube placed by Dr. Karna Christmas   #Sedation needs in setting of mechanical ventilation PMHx: Anxiety, depression -Maintain a RASS goal of 0 to -1 -Fentanyl and Propofol as needed to maintain RASS goal -Avoid sedating medications as able -Daily wake up assessment     Best Practice (right click and "Reselect all SmartList Selections" daily)   Diet/type: NPO DVT prophylaxis: Heparin SQ GI prophylaxis: PPI Lines: N/A Foley:  N/A Code Status:  full code Last date of multidisciplinary goals of care discussion [N/A]  9/25: Pt's husband updated by Dr. Karna Christmas  Labs   CBC: Recent Labs  Lab 01/23/23 1317  WBC 4.8  HGB 13.5  HCT 40.9  MCV 97.1  PLT 179    Basic Metabolic Panel: No results for input(s): "NA", "K", "CL", "CO2", "GLUCOSE", "BUN", "CREATININE", "CALCIUM", "MG", "PHOS" in the last 168 hours. GFR: CrCl cannot be  calculated (Patient's most recent lab result is older than the maximum 21 days allowed.). Recent Labs  Lab 01/23/23 1317  WBC 4.8    Liver Function Tests: No results for input(s): "AST", "ALT", "ALKPHOS", "BILITOT", "PROT", "ALBUMIN" in the last 168 hours. No results for input(s): "LIPASE", "AMYLASE" in the last 168 hours. No results for input(s): "AMMONIA" in the last 168 hours.  ABG    Component Value Date/Time   HCO3 31.2 (H) 02/01/2021 0141   O2SAT 77.2 02/01/2021 0141     Coagulation Profile: Recent Labs  Lab 01/23/23 1317  INR 1.0    Cardiac Enzymes: No results for input(s): "CKTOTAL", "CKMB", "CKMBINDEX", "TROPONINI" in the last 168 hours.  HbA1C: Hgb A1c MFr Bld  Date/Time Value Ref Range Status  11/03/2021 03:51 PM 5.7 (H) 4.8 - 5.6 % Final    Comment:             Prediabetes: 5.7 - 6.4          Diabetes: >6.4          Glycemic control for adults with diabetes: <7.0   05/26/2021 11:01 AM 5.6 4.8 - 5.6 % Final    Comment:             Prediabetes: 5.7 - 6.4  Diabetes: >6.4          Glycemic control for adults with diabetes: <7.0     CBG: No results for input(s): "GLUCAP" in the last 168 hours.  Review of Systems:   Unable to assess due to intubation/sedation/AMS   Past Medical History:  She,  has a past medical history of Anxiety, Arthritis, Breast cancer (HCC) (1997), Breast cancer (HCC) (11/15/2017), 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HCC) (11/10/2014), COPD (chronic obstructive pulmonary disease) (HCC), Coronary artery disease, Cough (03/02/2017), Depression, Dyspnea, GERD (gastroesophageal reflux disease), History of hiatal hernia, Personal history of chemotherapy, Personal history of radiation therapy, Skin cancer, Throat cancer (HCC) (1998), and Wears dentures.   Surgical History:   Past Surgical History:  Procedure Laterality Date   BIOPSY  01/05/2023   Procedure: BIOPSY;  Surgeon: Toney Reil, MD;  Location: [facility];  Service:  Gastroenterology;;   BREAST BIOPSY Right 1997   positive   BREAST BIOPSY Right 11/15/2017   INVASIVE MAMMARY CARCINOMA triple negative   BREAST BIOPSY Right 07/20/2020   [place] bx, Q marker, neg   BREAST LUMPECTOMY Right 1997   2019 also   BREAST LUMPECTOMY WITH SENTINEL LYMPH NODE BIOPSY Right 12/08/2017   Procedure: BREAST LUMPECTOMY WITH SENTINEL LYMPH NODE BX;  Surgeon: Earline Mayotte, MD;  Location: [facility];  Service: General;  Laterality: Right;   BREAST REDUCTION WITH MASTOPEXY Left 06/10/2019   Procedure: Left breast mastopexy reduction for symmetry;  Surgeon: Peggye Form, DO;  Location: [facility];  Service: Plastics;  Laterality: Left;  total 2.5 hours   CATARACT EXTRACTION W/PHACO Right 02/07/2022   Procedure: CATARACT EXTRACTION PHACO AND INTRAOCULAR LENS PLACEMENT (IOC) RIGHT DIABETIC;  Surgeon: Nevada Crane, MD;  Location: [facility];  Service: Ophthalmology;  Laterality: Right;  3.60 00:34.7   CATARACT EXTRACTION W/PHACO Left 02/28/2022   Procedure: CATARACT EXTRACTION PHACO AND INTRAOCULAR LENS PLACEMENT (IOC) LEFT DIABETIC 4.11 00:26.1;  Surgeon: Nevada Crane, MD;  Location: [facility];  Service: Ophthalmology;  Laterality: Left;   COLONOSCOPY  2015   COLONOSCOPY N/A 01/05/2023   Procedure: COLONOSCOPY;  Surgeon: Toney Reil, MD;  Location: [facility] ENDOSCOPY;  Service: Gastroenterology;  Laterality: N/A;   COLONOSCOPY WITH PROPOFOL N/A 10/29/2019   Procedure: COLONOSCOPY WITH PROPOFOL;  Surgeon: Midge Minium, MD;  Location: [facility]COPY;  Service: Endoscopy;  Laterality: N/A;   ESOPHAGOGASTRODUODENOSCOPY (EGD) WITH PROPOFOL N/A 05/24/2019   Procedure: ESOPHAGOGASTRODUODENOSCOPY (EGD) WITH PROPOFOL;  Surgeon: Midge Minium, MD;  Location: [facility];  Service: Endoscopy;  Laterality: N/A;   ESOPHAGOGASTRODUODENOSCOPY (EGD) WITH PROPOFOL N/A 09/06/2022   Procedure: ESOPHAGOGASTRODUODENOSCOPY (EGD) WITH PROPOFOL;   Surgeon: Midge Minium, MD;  Location: [facility];  Service: Endoscopy;  Laterality: N/A;   ESOPHAGOGASTRODUODENOSCOPY (EGD) WITH PROPOFOL  01/05/2023   Procedure: ESOPHAGOGASTRODUODENOSCOPY (EGD) WITH PROPOFOL;  Surgeon: Toney Reil, MD;  Location: [facility] ENDOSCOPY;  Service: Gastroenterology;;   ESOPHAGUS SURGERY     KNEE SURGERY Right    LIPOSUCTION WITH LIPOFILLING Right 06/10/2019   Procedure: right breast scar and capsule release with fat grafting;  Surgeon: Peggye Form, DO;  Location: [facility];  Service: Plastics;  Laterality: Right;   POLYPECTOMY  01/05/2023   Procedure: POLYPECTOMY;  Surgeon: Toney Reil, MD;  Location: [facility];  Service: Gastroenterology;;   [facility] REMOVAL     PORTACATH PLACEMENT Right 01/17/2018   Procedure: INSERTION PORT-A-CATH- RIGHT;  Surgeon: Earline Mayotte, MD;  Location: [facility];  Service: General;  Laterality: Right;   ROBOTIC ADRENALECTOMY Right 12/28/2016   Procedure: ROBOTIC ADRENALECTOMY;  Surgeon: Vanna Scotland, MD;  Location: [facility];  Service: Urology;  Laterality: Right;   SKIN CANCER EXCISION     THROAT SURGERY  1998   throat cancer    TUBAL LIGATION     VENTRAL HERNIA REPAIR N/A 03/03/2017   Procedure: HERNIA REPAIR VENTRAL ADULT;  Surgeon: Ricarda Frame, MD;  Location: [facility];  Service: General;  Laterality: N/A;     Social History:   reports that she quit smoking about 27 years ago. Her smoking use included cigarettes. She started smoking about 57 years ago. She has a 45 pack-year smoking history. She has been exposed to tobacco smoke. She has never used smokeless tobacco. She reports that she does not currently use alcohol after a past usage of about 4.0 standard drinks of alcohol per week. She reports that she does not use drugs.   Family History:  Her family history includes Cancer in her paternal aunt, paternal grandmother, and sister; Cancer (age of onset: 20) in her sister;  Diabetes in her mother; Heart attack in her mother. There is no history of Prostate cancer, Kidney cancer, Bladder Cancer, or Breast cancer.   Allergies Allergies  Allergen Reactions   Sulfa Antibiotics Other (See Comments)    Mouth sore and raw     Home Medications  Prior to Admission medications   Medication Sig Start Date End Date Taking? Authorizing Provider  alum & mag hydroxide-simeth (MAALOX PLUS) 400-400-40 MG/5ML suspension Take 15 mLs by mouth every 6 (six) hours as needed for indigestion. 05/23/22  Yes Jacky Kindle, FNP  busPIRone (BUSPAR) 5 MG tablet Take 1 tablet (5 mg total) by mouth 3 (three) times daily. 10/12/22  Yes Jacky Kindle, FNP  DULoxetine (CYMBALTA) 60 MG capsule Take 1 capsule (60 mg total) by mouth 2 (two) times daily. 10/12/22  Yes Merita Norton T, FNP  gabapentin (NEURONTIN) 300 MG capsule TAKE 1 CAPSULE BY MOUTH 3 TIMES DAILY 10/11/22  Yes Merita Norton T, FNP  montelukast (SINGULAIR) 10 MG tablet Take 1 tablet (10 mg total) by mouth at bedtime. 07/14/22  Yes Bacigalupo, Marzella Schlein, MD  omeprazole (PRILOSEC) 40 MG capsule TAKE 1 CAPSULE BY MOUTH DAILY 09/29/22  Yes Merita Norton T, FNP  oxyCODONE (ROXICODONE) 5 MG immediate release tablet Take 1 tablet (5 mg total) by mouth every 6 (six) hours as needed for severe pain or moderate pain. 01/06/23  Yes Kennieth Francois, PA  aspirin EC 81 MG tablet Take 1 tablet (81 mg total) by mouth daily. Swallow whole. 12/08/21   Furth, Cadence H, PA-C  buPROPion (WELLBUTRIN XL) 150 MG 24 hr tablet Take 3 tablets (450 mg total) by mouth daily. 01/16/23   Jacky Kindle, FNP  cyclobenzaprine (FLEXERIL) 5 MG tablet TAKE 1 TABLET BY MOUTH AT BEDTIME 12/14/22   Merita Norton T, FNP  rOPINIRole (REQUIP) 0.25 MG tablet TAKE 1 TABLET BY MOUTH AT BEDTIME 08/04/22   Jacky Kindle, FNP  rosuvastatin (CRESTOR) 40 MG tablet Take 1 tablet (40 mg total) by mouth daily. 12/15/21   Jacky Kindle, FNP     Critical care time: 55 minutes     Harlon Ditty, AGACNP-BC [facility] Prefer epic messenger for cross cover needs If after hours, please call E-link

## 2023-01-26 ENCOUNTER — Inpatient Hospital Stay: Payer: Medicare HMO

## 2023-01-26 ENCOUNTER — Encounter: Payer: Self-pay | Admitting: Pulmonary Disease

## 2023-01-26 ENCOUNTER — Other Ambulatory Visit: Payer: Medicare HMO

## 2023-01-26 DIAGNOSIS — I483 Typical atrial flutter: Secondary | ICD-10-CM | POA: Diagnosis not present

## 2023-01-26 DIAGNOSIS — I480 Paroxysmal atrial fibrillation: Secondary | ICD-10-CM

## 2023-01-26 DIAGNOSIS — J9601 Acute respiratory failure with hypoxia: Secondary | ICD-10-CM | POA: Diagnosis not present

## 2023-01-26 DIAGNOSIS — R000 Tachycardia, unspecified: Secondary | ICD-10-CM

## 2023-01-26 DIAGNOSIS — R7989 Other specified abnormal findings of blood chemistry: Secondary | ICD-10-CM

## 2023-01-26 DIAGNOSIS — J939 Pneumothorax, unspecified: Secondary | ICD-10-CM

## 2023-01-26 LAB — RENAL FUNCTION PANEL
Albumin: 3.4 g/dL — ABNORMAL LOW (ref 3.5–5.0)
Anion gap: 8 (ref 5–15)
BUN: 26 mg/dL — ABNORMAL HIGH (ref 8–23)
CO2: 22 mmol/L (ref 22–32)
Calcium: 8.7 mg/dL — ABNORMAL LOW (ref 8.9–10.3)
Chloride: 106 mmol/L (ref 98–111)
Creatinine, Ser: 0.96 mg/dL (ref 0.44–1.00)
GFR, Estimated: >60 mL/min (ref >60)
Glucose, Bld: 167 mg/dL — ABNORMAL HIGH (ref 70–99)
Phosphorus: 4.2 mg/dL (ref 2.5–4.6)
Potassium: 4.9 mmol/L (ref 3.5–5.1)
Sodium: 136 mmol/L (ref 135–145)

## 2023-01-26 LAB — CBC
HCT: 40.1 % (ref 36.0–46.0)
Hemoglobin: 12.9 g/dL (ref 12.0–15.0)
MCH: 31.8 pg (ref 26.0–34.0)
MCHC: 32.2 g/dL (ref 30.0–36.0)
MCV: 98.8 fL (ref 80.0–100.0)
Platelets: 165 10*3/uL (ref 150–400)
RBC: 4.06 MIL/uL (ref 3.87–5.11)
RDW: 12.5 % (ref 11.5–15.5)
WBC: 8 10*3/uL (ref 4.0–10.5)
nRBC: 0 % (ref 0.0–0.2)

## 2023-01-26 LAB — GLUCOSE, CAPILLARY
Glucose-Capillary: 161 mg/dL — ABNORMAL HIGH (ref 70–99)
Glucose-Capillary: 125 mg/dL — ABNORMAL HIGH (ref 70–99)
Glucose-Capillary: 121 mg/dL — ABNORMAL HIGH (ref 70–99)
Glucose-Capillary: 210 mg/dL — ABNORMAL HIGH (ref 70–99)

## 2023-01-26 LAB — MAGNESIUM: Magnesium: 2.3 mg/dL (ref 1.7–2.4)

## 2023-01-26 LAB — HIV ANTIBODY (ROUTINE TESTING W REFLEX): HIV Screen 4th Generation wRfx: NONREACTIVE

## 2023-01-26 LAB — TRIGLYCERIDES: Triglycerides: 97 mg/dL (ref <150)

## 2023-01-26 LAB — TROPONIN I (HIGH SENSITIVITY)
Troponin I (High Sensitivity): 73 ng/L — ABNORMAL HIGH (ref <18)
Troponin I (High Sensitivity): 56 ng/L — ABNORMAL HIGH (ref <18)

## 2023-01-26 MED ORDER — CYCLOBENZAPRINE HCL 5 MG PO TABS
5.0000 mg | ORAL_TABLET | Freq: Every day | ORAL | Status: DC
  Administered 2023-01-26 – 2023-01-27 (×2): 5 mg via ORAL
  Filled 2023-01-26 (×4): qty 1

## 2023-01-26 MED ORDER — DM-GUAIFENESIN ER 30-600 MG PO TB12
1.0000 | ORAL_TABLET | Freq: Two times a day (BID) | ORAL | Status: DC
  Administered 2023-01-26 – 2023-01-28 (×4): 1 via ORAL
  Filled 2023-01-26 (×4): qty 1

## 2023-01-26 MED ORDER — ADULT MULTIVITAMIN W/MINERALS CH
1.0000 | ORAL_TABLET | Freq: Every day | ORAL | Status: DC
  Administered 2023-01-27 – 2023-01-28 (×2): 1 via ORAL
  Filled 2023-01-26 (×2): qty 1

## 2023-01-26 MED ORDER — CYCLOBENZAPRINE HCL 5 MG PO TABS
5.0000 mg | ORAL_TABLET | Freq: Once | ORAL | Status: AC
  Administered 2023-01-26: 5 mg via ORAL
  Filled 2023-01-26: qty 1

## 2023-01-26 MED ORDER — OXYCODONE HCL 5 MG PO TABS
5.0000 mg | ORAL_TABLET | Freq: Four times a day (QID) | ORAL | Status: DC | PRN
  Administered 2023-01-26: 5 mg via ORAL
  Filled 2023-01-26: qty 1

## 2023-01-26 MED ORDER — BUSPIRONE HCL 10 MG PO TABS
5.0000 mg | ORAL_TABLET | Freq: Three times a day (TID) | ORAL | Status: DC
  Administered 2023-01-26 – 2023-01-28 (×7): 5 mg via ORAL
  Filled 2023-01-26 (×7): qty 1

## 2023-01-26 MED ORDER — GABAPENTIN 300 MG PO CAPS
300.0000 mg | ORAL_CAPSULE | Freq: Three times a day (TID) | ORAL | Status: DC
  Administered 2023-01-26 – 2023-01-28 (×7): 300 mg via ORAL
  Filled 2023-01-26 (×7): qty 1

## 2023-01-26 MED ORDER — MORPHINE SULFATE (PF) 2 MG/ML IV SOLN
1.0000 mg | INTRAVENOUS | Status: DC | PRN
  Administered 2023-01-26 – 2023-01-27 (×3): 2 mg via INTRAVENOUS
  Filled 2023-01-26 (×3): qty 1

## 2023-01-26 MED ORDER — FUROSEMIDE 10 MG/ML IJ SOLN
20.0000 mg | Freq: Once | INTRAMUSCULAR | Status: AC
  Administered 2023-01-26: 20 mg via INTRAVENOUS
  Filled 2023-01-26: qty 2

## 2023-01-26 MED ORDER — ORAL CARE MOUTH RINSE: 15.0000 mL | OROMUCOSAL | Status: DC | PRN

## 2023-01-26 MED ORDER — DIAZEPAM 5 MG/ML IJ SOLN: 2.5000 mg | Freq: Three times a day (TID) | INTRAMUSCULAR | Status: DC | PRN

## 2023-01-26 MED ORDER — HEPARIN (PORCINE) 25000 UT/250ML-% IV SOLN
850.0000 [IU]/h | INTRAVENOUS | Status: DC
  Filled 2023-01-26: qty 250

## 2023-01-26 MED ORDER — MONTELUKAST SODIUM 10 MG PO TABS
10.0000 mg | ORAL_TABLET | Freq: Every day | ORAL | Status: DC
  Administered 2023-01-26 – 2023-01-27 (×2): 10 mg via ORAL
  Filled 2023-01-26 (×2): qty 1

## 2023-01-26 MED ORDER — ROSUVASTATIN CALCIUM 10 MG PO TABS
40.0000 mg | ORAL_TABLET | Freq: Every day | ORAL | Status: DC
  Administered 2023-01-26 – 2023-01-28 (×3): 40 mg via ORAL
  Filled 2023-01-26 (×3): qty 4

## 2023-01-26 MED ORDER — PANTOPRAZOLE SODIUM 40 MG PO TBEC
40.0000 mg | DELAYED_RELEASE_TABLET | Freq: Every day | ORAL | Status: DC
  Administered 2023-01-27 – 2023-01-28 (×2): 40 mg via ORAL
  Filled 2023-01-26 (×2): qty 1

## 2023-01-26 MED ORDER — METOPROLOL TARTRATE 25 MG PO TABS
25.0000 mg | ORAL_TABLET | Freq: Two times a day (BID) | ORAL | Status: DC
  Administered 2023-01-26 – 2023-01-27 (×3): 25 mg via ORAL
  Filled 2023-01-26 (×4): qty 1

## 2023-01-26 MED ORDER — ENOXAPARIN SODIUM 40 MG/0.4ML IJ SOSY
40.0000 mg | PREFILLED_SYRINGE | Freq: Every day | INTRAMUSCULAR | Status: DC
  Administered 2023-01-26: 40 mg via SUBCUTANEOUS
  Filled 2023-01-26: qty 0.4

## 2023-01-26 MED ORDER — ROPINIROLE HCL 0.25 MG PO TABS
0.2500 mg | ORAL_TABLET | Freq: Every day | ORAL | Status: DC
  Administered 2023-01-26 – 2023-01-27 (×2): 0.25 mg via ORAL
  Filled 2023-01-26 (×2): qty 1

## 2023-01-26 MED ORDER — BUPROPION HCL ER (XL) 300 MG PO TB24
450.0000 mg | ORAL_TABLET | Freq: Every day | ORAL | Status: DC
  Administered 2023-01-26 – 2023-01-28 (×3): 450 mg via ORAL
  Filled 2023-01-26 (×3): qty 1

## 2023-01-26 MED ORDER — LABETALOL HCL 5 MG/ML IV SOLN: 10.0000 mg | INTRAVENOUS | Status: DC | PRN

## 2023-01-26 MED ORDER — DULOXETINE HCL 30 MG PO CPEP
60.0000 mg | ORAL_CAPSULE | Freq: Two times a day (BID) | ORAL | Status: DC
  Administered 2023-01-26 – 2023-01-28 (×5): 60 mg via ORAL
  Filled 2023-01-26 (×5): qty 2

## 2023-01-26 MED ORDER — PHENOL 1.4 % MT LIQD: 1.0000 | OROMUCOSAL | Status: DC | PRN

## 2023-01-26 MED ORDER — MORPHINE SULFATE (PF) 2 MG/ML IV SOLN
1.0000 mg | Freq: Once | INTRAVENOUS | Status: AC
  Administered 2023-01-26: 1 mg via INTRAVENOUS
  Filled 2023-01-26: qty 1

## 2023-01-26 MED ORDER — ASPIRIN 81 MG PO TBEC
81.0000 mg | DELAYED_RELEASE_TABLET | Freq: Every day | ORAL | Status: DC
  Administered 2023-01-26 – 2023-01-28 (×3): 81 mg via ORAL
  Filled 2023-01-26 (×3): qty 1

## 2023-01-26 MED ORDER — ENSURE ENLIVE PO LIQD
237.0000 mL | Freq: Three times a day (TID) | ORAL | Status: DC
  Administered 2023-01-26 – 2023-01-28 (×6): 237 mL via ORAL

## 2023-01-26 MED ORDER — MORPHINE SULFATE (PF) 2 MG/ML IV SOLN: 1.0000 mg | INTRAVENOUS | Status: DC | PRN

## 2023-01-26 NOTE — Progress Notes (Signed)
NAME:  Denise Watts, MRN:  [id number], DOB:  February 10, 1956, LOS: 1 ADMISSION DATE:  01/25/2023, CONSULTATION DATE:  01/25/2023 REFERRING MD:  Dr. Karna Christmas, CHIEF COMPLAINT:  Acute Hypoxic Respiratory Failure, Pneumothorax   Brief Pt Description / Synopsis:  67 y.o female with PMHx significant for COPD, vocal cord and breast cancer who underwent elective Bronchoscopy for evaluation of lung nodules.  Procedure complicated by development of acute large left sided Pneumothorax with acute hypoxic respiratory failure requiring intubation, mechanical ventilation, and chest tube placement.  History of Present Illness:  Denise Watts is a 67 year old female with a past medical history significant for COPD, chronic cough, CAD, hiatal hernia, vocal cord cancer, breast cancer, anxiety disorder, major depressive disorder who presented to [facility] on 01/25/2023 for elective bronchoscopy for evaluation of lung nodules.  She has been following with oncology for ongoing evaluation and treatment of vocal cord and breast cancer.  She was recently noted to have lung nodules worse on the left.  She underwent PET scan noting hypermetabolic lesions of the left upper lobe as well as low avidity of left lower lobe lesion.  IR attempted to do biopsy, however was traumatic with bleeding and was unsuccessful, therefore she was referred for follow-up with Pulmonology to attempt lung biopsy via bronchoscopic approach.  In PACU following bronchoscopy she was noted to be hypoxic with O2 sats dropping to 88%.  X-ray was notable for large left-sided pneumothorax.  She was reintubated by anesthesia, and transferred to ICU.  Dr. Karna Christmas  placed emergent chest tube.  PCCM asked to consult for ventilator management.  Please see "significant hospital events" section below for full detailed hospital course.   Pertinent  Medical History   Past Medical History:  Diagnosis Date   Anxiety    Arthritis    KNEE RIGHT   Breast cancer (HCC) 1997    right breast lumpectomy with radiation   Breast cancer (HCC) 11/15/2017   INVASIVE MAMMARY CARCINOMA/ triple negative   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HCC) 11/10/2014   COPD (chronic obstructive pulmonary disease) (HCC)    Coronary artery disease    Cough 03/02/2017   INTERMTTENT DRY COUGH   Depression    Dyspnea    GERD (gastroesophageal reflux disease)    History of hiatal hernia    Personal history of chemotherapy    Personal history of radiation therapy    Skin cancer    Throat cancer (HCC) 1998   radiation   Wears dentures    full upper and lower     Micro Data:  N/A  Antimicrobials:   Anti-infectives (From admission, onward)    None       Significant Hospital Events: Including procedures, antibiotic start and stop dates in addition to other pertinent events   9/25: Presented for elective Bronchoscopy and lung biopsy.  Procedure complicated by large left sided PTX and acute hypoxic respiratory failure requiring intubation in PACU.  Dr. Karna Christmas placing emergent chest tube.  Transfer to ICU, PCCM consulted. 9/26: Resolution of PTX on X-ray.  EXTUBATED. New onset A.fib during SBT/extubation, Obtain Echocardiogram and consult Cardiology.  Following extubation will perform clamp trial of chest tube to evaluate for possible removal of chest tube.  Interim History / Subjective:  -No significant events noted overnight -Afebrile, hemodynamically stable, on minimal vent support -SBT in progress ~successfully  EXTUBATED -During extubation was anxious ~ developed new onset A.fib w/ RVR 130's -More calm now~ A.fib HR now 110-120 ~ will check troponin, TSH, obtain Echo,  and consult Cardiology -Will clamp chest tube and repeat CXR this afternoon for possible removal of chest tube  Objective   Blood pressure (!) 88/55, pulse 73, temperature 98.1 F (36.7 C), temperature source Axillary, resp. rate 18, height 5\' 1"  (1.549 m), weight 62.3 kg, SpO2 97%.    Vent Mode: PSV FiO2 (%):   [30 %-100 %] 30 % Set Rate:  [18 bmp] 18 bmp Vt Set:  [380 mL-450 mL] 450 mL PEEP:  [5 cmH20] 5 cmH20 Pressure Support:  [5 cmH20] 5 cmH20 Plateau Pressure:  [15 cmH20-21 cmH20] 15 cmH20   Intake/Output Summary (Last 24 hours) at 01/26/2023 8295 Last data filed at 01/26/2023 6213 Gross per 24 hour  Intake 1058.57 ml  Output 575 ml  Net 483.57 ml   Filed Weights   01/25/23 1110 01/26/23 0415  Weight: 59.9 kg 62.3 kg    Examination: General: Acute on chronically ill-appearing female, laying in bed, intubated, awake, no acute distress HENT: Atraumatic, normocephalic, neck supple, no JVD Lungs: Coarse throughout, even, nonlabored, normal effort, overbreathing the vent Cardiovascular: Tachycardia, irregularly irregular rhythm, no murmurs, rubs, gallops Abdomen: Soft, nontender, nondistended, no guarding rebound tenderness, bowel sounds positive x 4 Extremities: Normal bulk and tone, no deformities, no edema Neuro: Off sedation, following commands no focal deficits noted nodding to questions, pupils PERRLA GU: External female catheter in place  Resolved Hospital Problem list     Assessment & Plan:   #Acute Hypoxic Respiratory Failure  #Acute Pneumothorax following Bronchoscopy with lung biopsy #COPD without acute exacerbation PMHx: COPD, Multiple lung nodules with hx of vocal cord and breast cancer -Full vent support, implement lung protective strategies -Plateau pressures less than 30 cm H20 -Wean FiO2 & PEEP as tolerated to maintain O2 sats 88% to 92% -Follow intermittent Chest X-ray & ABG as needed -Spontaneous Breathing Trials when respiratory parameters met and mental status permits -Implement VAP Bundle -Bronchodilators & Pulmicort nebs -Continue Chest tube to suction ~ after extubation will clamp tube and obtain CXR later in afternoon to evaluate for possible removal of tube -Follow up Cytology results  #New onset Atrial Fibrillation #Hypertension PMHx:  CAD -Continuous cardiac monitoring -Maintain MAP >65 -Vasopressors as needed to maintain MAP goal ~ not requiring -HS Troponin peaked at 73 -Check Thyroid panel -Echocardiogram pending -Consult Cardiology, appreciate input  #Sedation needs in setting of mechanical ventilation #Anxiety  #Depression -Maintain a RASS goal of 0 to -1 -Fentanyl and Propofol as needed to maintain RASS goal -Avoid sedating medications as able -Daily wake up assessment -Once able to tolerate PO will resume Wellbutrin, Buspar, Flexeril, Cymbalta, Gabapentin, and Requip     Best Practice (right click and "Reselect all SmartList Selections" daily)   Diet/type: NPO pending bedside swallow evaluation DVT prophylaxis: Heparin SQ GI prophylaxis: PPI Lines: N/A Foley:  N/A Code Status:  full code Last date of multidisciplinary goals of care discussion [9/26]  9/26: Pt and her daughter updated at bedside.  Labs   CBC: Recent Labs  Lab 01/23/23 1317 01/25/23 1931 01/26/23 0511  WBC 4.8 9.4 8.0  HGB 13.5 13.3 12.9  HCT 40.9 39.7 40.1  MCV 97.1 96.1 98.8  PLT 179 178 165    Basic Metabolic Panel: Recent Labs  Lab 01/25/23 1639 01/26/23 0511  NA 139 136  K 4.8 4.9  CL 106 106  CO2 20* 22  GLUCOSE 146* 167*  BUN 17 26*  CREATININE 0.73  0.71 0.96  CALCIUM 8.7* 8.7*  MG  --  2.3  PHOS 4.2 4.2   GFR: Estimated Creatinine Clearance: 46.8 mL/min (by C-G formula based on SCr of 0.96 mg/dL). Recent Labs  Lab 01/23/23 1317 01/25/23 1931 01/26/23 0511  WBC 4.8 9.4 8.0    Liver Function Tests: Recent Labs  Lab 01/25/23 1639 01/26/23 0511  ALBUMIN 3.7 3.4*   No results for input(s): "LIPASE", "AMYLASE" in the last 168 hours. No results for input(s): "AMMONIA" in the last 168 hours.  ABG    Component Value Date/Time   PHART 7.44 01/25/2023 2103   PCO2ART 41 01/25/2023 2103   PO2ART 89 01/25/2023 2103   HCO3 27.8 01/25/2023 2103   ACIDBASEDEF 2.4 (H) 01/25/2023 1610   O2SAT  99.2 01/25/2023 2103     Coagulation Profile: Recent Labs  Lab 01/23/23 1317  INR 1.0    Cardiac Enzymes: No results for input(s): "CKTOTAL", "CKMB", "CKMBINDEX", "TROPONINI" in the last 168 hours.  HbA1C: Hgb A1c MFr Bld  Date/Time Value Ref Range Status  11/03/2021 03:51 PM 5.7 (H) 4.8 - 5.6 % Final    Comment:             Prediabetes: 5.7 - 6.4          Diabetes: >6.4          Glycemic control for adults with diabetes: <7.0   05/26/2021 11:01 AM 5.6 4.8 - 5.6 % Final    Comment:             Prediabetes: 5.7 - 6.4          Diabetes: >6.4          Glycemic control for adults with diabetes: <7.0     CBG: Recent Labs  Lab 01/25/23 1543 01/25/23 1940 01/25/23 2319 01/26/23 0321  GLUCAP 132* 164* 198* 161*    Review of Systems:   Unable to assess due to intubation/sedation/AMS   Past Medical History:  She,  has a past medical history of Anxiety, Arthritis, Breast cancer (HCC) (1997), Breast cancer (HCC) (11/15/2017), 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HCC) (11/10/2014), COPD (chronic obstructive pulmonary disease) (HCC), Coronary artery disease, Cough (03/02/2017), Depression, Dyspnea, GERD (gastroesophageal reflux disease), History of hiatal hernia, Personal history of chemotherapy, Personal history of radiation therapy, Skin cancer, Throat cancer (HCC) (1998), and Wears dentures.   Surgical History:   Past Surgical History:  Procedure Laterality Date   BIOPSY  01/05/2023   Procedure: BIOPSY;  Surgeon: Toney Reil, MD;  Location: [facility] ENDOSCOPY;  Service: Gastroenterology;;   BREAST BIOPSY Right 1997   positive   BREAST BIOPSY Right 11/15/2017   INVASIVE MAMMARY CARCINOMA triple negative   BREAST BIOPSY Right 07/20/2020   [place] bx, Q marker, neg   BREAST LUMPECTOMY Right 1997   2019 also   BREAST LUMPECTOMY WITH SENTINEL LYMPH NODE BIOPSY Right 12/08/2017   Procedure: BREAST LUMPECTOMY WITH SENTINEL LYMPH NODE BX;  Surgeon: Earline Mayotte, MD;  Location: [facility]  ORS;  Service: General;  Laterality: Right;   BREAST REDUCTION WITH MASTOPEXY Left 06/10/2019   Procedure: Left breast mastopexy reduction for symmetry;  Surgeon: Peggye Form, DO;  Location: [facility];  Service: Plastics;  Laterality: Left;  total 2.5 hours   CATARACT EXTRACTION W/PHACO Right 02/07/2022   Procedure: CATARACT EXTRACTION PHACO AND INTRAOCULAR LENS PLACEMENT (IOC) RIGHT DIABETIC;  Surgeon: Nevada Crane, MD;  Location: [facility]GERY [facility];  Service: Ophthalmology;  Laterality: Right;  3.60 00:34.7   CATARACT EXTRACTION W/PHACO Left 02/28/2022   Procedure: CATARACT EXTRACTION PHACO AND  INTRAOCULAR LENS PLACEMENT (IOC) LEFT DIABETIC 4.11 00:26.1;  Surgeon: Nevada Crane, MD;  Location: [facility];  Service: Ophthalmology;  Laterality: Left;   COLONOSCOPY  2015   COLONOSCOPY N/A 01/05/2023   Procedure: COLONOSCOPY;  Surgeon: Toney Reil, MD;  Location: [facility] ENDOSCOPY;  Service: Gastroenterology;  Laterality: N/A;   COLONOSCOPY WITH PROPOFOL N/A 10/29/2019   Procedure: COLONOSCOPY WITH PROPOFOL;  Surgeon: Midge Minium, MD;  Location: [facility] ENDOSCOPY;  Service: Endoscopy;  Laterality: N/A;   ESOPHAGOGASTRODUODENOSCOPY (EGD) WITH PROPOFOL N/A 05/24/2019   Procedure: ESOPHAGOGASTRODUODENOSCOPY (EGD) WITH PROPOFOL;  Surgeon: Midge Minium, MD;  Location: [facility] ENDOSCOPY;  Service: Endoscopy;  Laterality: N/A;   ESOPHAGOGASTRODUODENOSCOPY (EGD) WITH PROPOFOL N/A 09/06/2022   Procedure: ESOPHAGOGASTRODUODENOSCOPY (EGD) WITH PROPOFOL;  Surgeon: Midge Minium, MD;  Location: [facility] ENDOSCOPY;  Service: Endoscopy;  Laterality: N/A;   ESOPHAGOGASTRODUODENOSCOPY (EGD) WITH PROPOFOL  01/05/2023   Procedure: ESOPHAGOGASTRODUODENOSCOPY (EGD) WITH PROPOFOL;  Surgeon: Toney Reil, MD;  Location: [facility] ENDOSCOPY;  Service: Gastroenterology;;   ESOPHAGUS SURGERY     KNEE SURGERY Right    LIPOSUCTION WITH LIPOFILLING Right 06/10/2019   Procedure: right  breast scar and capsule release with fat grafting;  Surgeon: Peggye Form, DO;  Location: [facility];  Service: Plastics;  Laterality: Right;   POLYPECTOMY  01/05/2023   Procedure: POLYPECTOMY;  Surgeon: Toney Reil, MD;  Location: [facility] ENDOSCOPY;  Service: Gastroenterology;;   [facility] REMOVAL     PORTACATH PLACEMENT Right 01/17/2018   Procedure: INSERTION PORT-A-CATH- RIGHT;  Surgeon: Earline Mayotte, MD;  Location: [facility] ORS;  Service: General;  Laterality: Right;   ROBOTIC ADRENALECTOMY Right 12/28/2016   Procedure: ROBOTIC ADRENALECTOMY;  Surgeon: Vanna Scotland, MD;  Location: [facility] ORS;  Service: Urology;  Laterality: Right;   SKIN CANCER EXCISION     THROAT SURGERY  1998   throat cancer    TUBAL LIGATION     VENTRAL HERNIA REPAIR N/A 03/03/2017   Procedure: HERNIA REPAIR VENTRAL ADULT;  Surgeon: Ricarda Frame, MD;  Location: [facility] ORS;  Service: General;  Laterality: N/A;   VIDEO BRONCHOSCOPY WITH ENDOBRONCHIAL ULTRASOUND N/A 01/25/2023   Procedure: VIDEO BRONCHOSCOPY WITH ENDOBRONCHIAL ULTRASOUND;  Surgeon: Vida Rigger, MD;  Location: [facility] ORS;  Service: Thoracic;  Laterality: N/A;     Social History:   reports that she quit smoking about 27 years ago. Her smoking use included cigarettes. She started smoking about 57 years ago. She has a 45 pack-year smoking history. She has been exposed to tobacco smoke. She has never used smokeless tobacco. She reports that she does not currently use alcohol after a past usage of about 4.0 standard drinks of alcohol per week. She reports that she does not use drugs.   Family History:  Her family history includes Cancer in her paternal aunt, paternal grandmother, and sister; Cancer (age of onset: 64) in her sister; Diabetes in her mother; Heart attack in her mother. There is no history of Prostate cancer, Kidney cancer, Bladder Cancer, or Breast cancer.   Allergies Allergies  Allergen Reactions   Sulfa  Antibiotics Other (See Comments)    Mouth sore and raw     Home Medications  Prior to Admission medications   Medication Sig Start Date End Date Taking? Authorizing Provider  alum & mag hydroxide-simeth (MAALOX PLUS) 400-400-40 MG/5ML suspension Take 15 mLs by mouth every 6 (six) hours as needed for indigestion. 05/23/22  Yes Jacky Kindle, FNP  busPIRone (BUSPAR) 5 MG tablet Take 1 tablet (5  mg total) by mouth 3 (three) times daily. 10/12/22  Yes Jacky Kindle, FNP  DULoxetine (CYMBALTA) 60 MG capsule Take 1 capsule (60 mg total) by mouth 2 (two) times daily. 10/12/22  Yes Merita Norton T, FNP  gabapentin (NEURONTIN) 300 MG capsule TAKE 1 CAPSULE BY MOUTH 3 TIMES DAILY 10/11/22  Yes Merita Norton T, FNP  montelukast (SINGULAIR) 10 MG tablet Take 1 tablet (10 mg total) by mouth at bedtime. 07/14/22  Yes Bacigalupo, Marzella Schlein, MD  omeprazole (PRILOSEC) 40 MG capsule TAKE 1 CAPSULE BY MOUTH DAILY 09/29/22  Yes Merita Norton T, FNP  oxyCODONE (ROXICODONE) 5 MG immediate release tablet Take 1 tablet (5 mg total) by mouth every 6 (six) hours as needed for severe pain or moderate pain. 01/06/23  Yes Kennieth Francois, PA  aspirin EC 81 MG tablet Take 1 tablet (81 mg total) by mouth daily. Swallow whole. 12/08/21   Furth, Cadence H, PA-C  buPROPion (WELLBUTRIN XL) 150 MG 24 hr tablet Take 3 tablets (450 mg total) by mouth daily. 01/16/23   Jacky Kindle, FNP  cyclobenzaprine (FLEXERIL) 5 MG tablet TAKE 1 TABLET BY MOUTH AT BEDTIME 12/14/22   Merita Norton T, FNP  rOPINIRole (REQUIP) 0.25 MG tablet TAKE 1 TABLET BY MOUTH AT BEDTIME 08/04/22   Jacky Kindle, FNP  rosuvastatin (CRESTOR) 40 MG tablet Take 1 tablet (40 mg total) by mouth daily. 12/15/21   Jacky Kindle, FNP     Critical care time: 40 minutes     Harlon Ditty, AGACNP-BC [facility] Prefer epic messenger for cross cover needs If after hours, please call E-link

## 2023-01-26 NOTE — Plan of Care (Signed)
Extubated to 4 lpm nasal canula. Tolerating well

## 2023-01-26 NOTE — Plan of Care (Signed)
Problem: Clinical Measurements: Goal: Will remain free from infection Outcome: Progressing Goal: Diagnostic test results will improve Outcome: Progressing   Problem: Activity: Goal: Risk for activity intolerance will decrease Outcome: Progressing   Problem: Skin Integrity: Goal: Risk for impaired skin integrity will decrease Outcome: Progressing

## 2023-01-26 NOTE — Evaluation (Signed)
Physical Therapy Evaluation Patient Details Name: Denise Watts MRN: [id number] DOB: 1956-03-18 Today's Date: 01/26/2023  History of Present Illness  Pt is a 67 year old female with a history of anxiety disorder, right knee osteoarthritis, COPD, coronary artery disease, chronic cough, major depressive disorder, chronic dyspnea, GERD, history of hiatal hernia, history of vocal cord cancer and breast cancer status post medical oncology evaluation with full scope of therapy including radiation.  Clinical Impression   Pt is received in bed, she is agreeable to PT session. Pt performs bed mobility and transfers min A secondary to L chest pain at chest tube site. Pt limited mobility at this time secondary to chest tube placement and increase discomfort. Able to SPT transfer from bed to recliner using 2 HHA due to RW being too painful, frequent standing breaks needed due to increase discomfort. Overall, Pt able to tolerate sitting position in recliner by the end of session. Pt would benefit from skilled PT to address above deficits and promote optimal return to PLOF.        If plan is discharge home, recommend the following: A little help with walking and/or transfers;A little help with bathing/dressing/bathroom;Assist for transportation;Help with stairs or ramp for entrance   Can travel by private vehicle   No    Equipment Recommendations None recommended by PT  Recommendations for Other Services       Functional Status Assessment Patient has had a recent decline in their functional status and demonstrates the ability to make significant improvements in function in a reasonable and predictable amount of time.     Precautions / Restrictions Precautions Precautions: Fall Precaution Comments: L chest tube Restrictions Weight Bearing Restrictions: No      Mobility  Bed Mobility Overal bed mobility: Needs Assistance Bed Mobility: Supine to Sit     Supine to sit: Min assist     General  bed mobility comments: assist for line management and scooting EOB due to limited use of L arm to prevent increase pain from chest tube site    Transfers Overall transfer level: Needs assistance Equipment used: 2 person hand held assist Transfers: Bed to chair/wheelchair/BSC     Step pivot transfers: Min assist       General transfer comment: required frequent standing breaks; slight shuffling steppage    Ambulation/Gait               General Gait Details: Defferred at this time due to pain  Stairs            Wheelchair Mobility     Tilt Bed    Modified Rankin (Stroke Patients Only)       Balance Overall balance assessment: Needs assistance Sitting-balance support: Bilateral upper extremity supported, Feet supported Sitting balance-Leahy Scale: Good Sitting balance - Comments: able to maintain seated EOB balance during functional activities   Standing balance support: Bilateral upper extremity supported, During functional activity, Reliant on assistive device for balance Standing balance-Leahy Scale: Fair Standing balance comment: able to maintain static standing balance during functional activities; heavy reliance on 2 HHA                             Pertinent Vitals/Pain Pain Assessment Pain Assessment: 0-10 Pain Score: 8  Pain Location: L chest Pain Descriptors / Indicators: Aching, Grimacing, Other (Comment) (chest tube site) Pain Intervention(s): Limited activity within patient's tolerance, Monitored during session, Repositioned    Home Living Family/patient expects to be  discharged to:: Private residence Living Arrangements: Spouse/significant other Available Help at Discharge: Family Type of Home: House Home Access: Stairs to enter Entrance Stairs-Rails: Can reach both Entrance Stairs-Number of Steps: 5 at front door, 3 at back door   Home Layout: One level Home Equipment: Shower seat;Grab bars - tub/shower;Cane - single  Librarian, academic (2 wheels);Rollator (4 wheels)      Prior Function Prior Level of Function : Independent/Modified Independent;History of Falls (last six months)             Mobility Comments: reports having 2 falls in the last 6 months ADLs Comments: indep     Extremity/Trunk Assessment   Upper Extremity Assessment Upper Extremity Assessment: Overall WFL for tasks assessed    Lower Extremity Assessment Lower Extremity Assessment: Generalized weakness       Communication   Communication Communication: No apparent difficulties  Cognition Arousal: Alert Behavior During Therapy: WFL for tasks assessed/performed Overall Cognitive Status: Within Functional Limits for tasks assessed                                 General Comments: AO x4; pleasant and cooperative during therapy        General Comments General comments (skin integrity, edema, etc.): chest tube intact pre/post session    Exercises     Assessment/Plan    PT Assessment Patient needs continued PT services  PT Problem List Decreased strength;Decreased mobility;Decreased range of motion;Decreased activity tolerance;Decreased balance       PT Treatment Interventions DME instruction;Gait training;Stair training;Functional mobility training;Therapeutic activities;Therapeutic exercise;Balance training    PT Goals (Current goals can be found in the Care Plan section)  Acute Rehab PT Goals Patient Stated Goal: to go home PT Goal Formulation: With patient Time For Goal Achievement: 02/09/23 Potential to Achieve Goals: Good    Frequency Min 1X/week     Co-evaluation               AM-PAC PT "6 Clicks" Mobility  Outcome Measure Help needed turning from your back to your side while in a flat bed without using bedrails?: A Little Help needed moving from lying on your back to sitting on the side of a flat bed without using bedrails?: A Little Help needed moving to and from a bed to a  chair (including a wheelchair)?: A Little Help needed standing up from a chair using your arms (e.g., wheelchair or bedside chair)?: A Little Help needed to walk in hospital room?: A Lot Help needed climbing 3-5 steps with a railing? : A Lot 6 Click Score: 16    End of Session Equipment Utilized During Treatment: Oxygen Activity Tolerance: Patient limited by pain Patient left: in chair;with call bell/phone within reach;with nursing/sitter in room Nurse Communication: Mobility status PT Visit Diagnosis: Unsteadiness on feet (R26.81);Muscle weakness (generalized) (M62.81);History of falling (Z91.81);Pain Pain - Right/Left:  (L side chest) Pain - part of body:  (L chest)    Time: 1610-9604 PT Time Calculation (min) (ACUTE ONLY): 20 min   Charges:                 Elmon Else, SPT   Stevens Magwood 01/26/2023, 3:48 PM

## 2023-01-26 NOTE — Consult Note (Signed)
Cardiology Consultation   Patient ID: ABAIGEAL BAAB MRN: [id number]; DOB: 11-06-1955  Admit date: 01/25/2023 Date of Consult: 01/26/2023  PCP:  Denise Kindle, FNP   [facility] Providers Cardiologist:  Denise Odea, MD   {  Patient Profile:   Denise Watts is a 67 y.o. female with a hx of COPD, chronic cough, nonobstructive CAD (mild LAD and RCA stenosis), hiatal hernia, vocal cord cancer, breast cancer, anxiety disorder, MDD who is being seen 01/26/2023 for the evaluation of new onset afib at the request of Denise Watts.  History of Present Illness:   Denise Watts is followed by Denise Watts for the above cardiac issues. Cardiac CTA in 11/2021 showed mild proximal LAD and RCA stenosis. Echo showed LVEF 55-60%, G1DD. She was last seen 08/2021 via telephone for pre-op risk assessment, stable from a cardiac perspective.  The patient presented on 01/25/23 for elective bronchoscopy for evaluation of lung nodules. Procedure was complicated by development of acute large sided pneumothorax with acute hypoxic respiratory failure requiring intubation, mechanical ventilation and chest tube placement. Patient developed new onset afib and cardiology was asked to see.   Labs: HS trop 73>56 Mag 2.3 WBC 8, Hgb 12.9  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   COPD (chronic obstructive pulmonary disease) (HCC)    Coronary artery disease    Cough 03/02/2017   INTERMTTENT DRY COUGH   Depression    Dyspnea    GERD (gastroesophageal reflux disease)    History of hiatal hernia    Personal history of chemotherapy    Personal history of radiation therapy    Skin cancer    Throat cancer (HCC) 1998   radiation   Wears dentures    full upper and lower    Past  Surgical History:  Procedure Laterality Date   BIOPSY  01/05/2023   Procedure: BIOPSY;  Surgeon: Denise Reil, MD;  Location: [facility] END[facility]COPY;  Service: Gastroenterology;;   BREAST BIOPSY Right 1997   positive   BREAST BIOPSY Right 11/15/2017   INVASIVE MAMMARY CARCINOMA triple negative   BREAST BIOPSY Right 07/20/2020   [place] bx, Q marker, neg   BREAST LUMPECTOMY Right 1997   2019 also   BREAST LUMPECTOMY WITH SENTINEL LYMPH NODE BIOPSY Right 12/08/2017   Procedure: BREAST LUMPECTOMY WITH SENTINEL LYMPH NODE BX;  Surgeon: Denise Mayotte, MD;  Location: [facility] ORS;  Service: General;  Laterality: Right;   BREAST REDUCTION WITH MASTOPEXY Left 06/10/2019   Procedure: Left breast mastopexy reduction for symmetry;  Surgeon: Denise Form, DO;  Location: [facility];  Service: Plastics;  Laterality: Left;  total 2.5 hours   CATARACT EXTRACTION W/PHACO Right 02/07/2022   Procedure: CATARACT EXTRACTION PHACO AND INTRAOCULAR LENS PLACEMENT (IOC) RIGHT DIABETIC;  Surgeon: Denise Crane, MD;  Location: [facility];  Service: Ophthalmology;  Laterality: Right;  3.60 00:34.7   CATARACT EXTRACTION W/PHACO Left 02/28/2022   Procedure: CATARACT EXTRACTION PHACO AND INTRAOCULAR LENS PLACEMENT (IOC) LEFT DIABETIC 4.11 00:26.1;  Surgeon: Denise Crane, MD;  Location: [facility];  Service: Ophthalmology;  Laterality: Left;   COLONOSCOPY  2015   COLONOSCOPY N/A 01/05/2023   Procedure: COLONOSCOPY;  Surgeon: Denise Reil,  MD;  Location: [facility];  Service: Gastroenterology;  Laterality: N/A;   COLONOSCOPY WITH PROPOFOL N/A 10/29/2019   Procedure: COLONOSCOPY WITH PROPOFOL;  Surgeon: Denise Minium, MD;  Location: [facility];  Service: Endoscopy;  Laterality: N/A;   ESOPHAGOGASTRODUODENOSCOPY (EGD) WITH PROPOFOL N/A 05/24/2019   Procedure: ESOPHAGOGASTRODUODENOSCOPY (EGD) WITH PROPOFOL;  Surgeon: Denise Minium, MD;  Location: [facility] ENDOSCOPY;   Service: Endoscopy;  Laterality: N/A;   ESOPHAGOGASTRODUODENOSCOPY (EGD) WITH PROPOFOL N/A 09/06/2022   Procedure: ESOPHAGOGASTRODUODENOSCOPY (EGD) WITH PROPOFOL;  Surgeon: Denise Minium, MD;  Location: [facility];  Service: Endoscopy;  Laterality: N/A;   ESOPHAGOGASTRODUODENOSCOPY (EGD) WITH PROPOFOL  01/05/2023   Procedure: ESOPHAGOGASTRODUODENOSCOPY (EGD) WITH PROPOFOL;  Surgeon: Denise Reil, MD;  Location: [facility];  Service: Gastroenterology;;   ESOPHAGUS SURGERY     KNEE SURGERY Right    LIPOSUCTION WITH LIPOFILLING Right 06/10/2019   Procedure: right breast scar and capsule release with fat grafting;  Surgeon: Denise Form, DO;  Location: [facility];  Service: Plastics;  Laterality: Right;   POLYPECTOMY  01/05/2023   Procedure: POLYPECTOMY;  Surgeon: Denise Reil, MD;  Location: [facility] END[facility];  Service: Gastroenterology;;   [facility] REMOVAL     PORTACATH PLACEMENT Right 01/17/2018   Procedure: INSERTION PORT-A-CATH- RIGHT;  Surgeon: Denise Mayotte, MD;  Location: [facility];  Service: General;  Laterality: Right;   ROBOTIC ADRENALECTOMY Right 12/28/2016   Procedure: ROBOTIC ADRENALECTOMY;  Surgeon: Denise Scotland, MD;  Location: [facility];  Service: Urology;  Laterality: Right;   SKIN CANCER EXCISION     THROAT SURGERY  1998   throat cancer    TUBAL LIGATION     VENTRAL HERNIA REPAIR N/A 03/03/2017   Procedure: HERNIA REPAIR VENTRAL ADULT;  Surgeon: Denise Frame, MD;  Location: [facility];  Service: General;  Laterality: N/A;   VIDEO BRONCHOSCOPY WITH ENDOBRONCHIAL ULTRASOUND N/A 01/25/2023   Procedure: VIDEO BRONCHOSCOPY WITH ENDOBRONCHIAL ULTRASOUND;  Surgeon: Denise Rigger, MD;  Location: [facility];  Service: Thoracic;  Laterality: N/A;     Home Medications:  Prior to Admission medications   Medication Sig Start Date End Date Taking? Authorizing Provider  alum & mag hydroxide-simeth (MAALOX PLUS) 400-400-40 MG/5ML suspension Take  15 mLs by mouth every 6 (six) hours as needed for indigestion. 05/23/22  Yes Denise Kindle, FNP  aspirin EC 81 MG tablet Take 1 tablet (81 mg total) by mouth daily. Swallow whole. 12/08/21  Yes Shaquinta Peruski H, PA-C  buPROPion (WELLBUTRIN XL) 150 MG 24 hr tablet Take 3 tablets (450 mg total) by mouth daily. 01/16/23  Yes Denise Kindle, FNP  busPIRone (BUSPAR) 5 MG tablet Take 1 tablet (5 mg total) by mouth 3 (three) times daily. 10/12/22  Yes Denise Kindle, FNP  cyclobenzaprine (FLEXERIL) 5 MG tablet TAKE 1 TABLET BY MOUTH AT BEDTIME 12/14/22  Yes Denise Kindle, FNP  DULoxetine (CYMBALTA) 60 MG capsule Take 1 capsule (60 mg total) by mouth 2 (two) times daily. 10/12/22  Yes Merita Norton T, FNP  gabapentin (NEURONTIN) 300 MG capsule TAKE 1 CAPSULE BY MOUTH 3 TIMES DAILY 10/11/22  Yes Merita Norton T, FNP  montelukast (SINGULAIR) 10 MG tablet Take 1 tablet (10 mg total) by mouth at bedtime. 07/14/22  Yes Bacigalupo, Marzella Schlein, MD  omeprazole (PRILOSEC) 40 MG capsule TAKE 1 CAPSULE BY MOUTH DAILY 09/29/22  Yes Merita Norton T, FNP  oxyCODONE (ROXICODONE) 5 MG immediate release tablet Take 1 tablet (5 mg total) by mouth every 6 (six) hours as  needed for severe pain or moderate pain. 01/06/23  Yes Kennieth Francois, PA  rOPINIRole (REQUIP) 0.25 MG tablet TAKE 1 TABLET BY MOUTH AT BEDTIME 08/04/22  Yes Denise Kindle, FNP  rosuvastatin (CRESTOR) 40 MG tablet Take 1 tablet (40 mg total) by mouth daily. 12/15/21  Yes Denise Kindle, FNP    Inpatient Medications: Scheduled Meds:  aspirin EC  81 mg Oral Daily   buPROPion  450 mg Oral Daily   busPIRone  5 mg Oral TID   Chlorhexidine Gluconate Cloth  6 each Topical Daily   cyclobenzaprine  5 mg Oral QHS   DULoxetine  60 mg Oral BID   gabapentin  300 mg Oral TID   heparin  5,000 Units Subcutaneous Q8H   ipratropium-albuterol  3 mL Nebulization Q6H   montelukast  10 mg Oral QHS   pantoprazole (PROTONIX) IV  40 mg Intravenous Daily   rOPINIRole  0.25 mg Oral QHS    rosuvastatin  40 mg Oral Daily   sodium chloride flush  3 mL Intravenous Q12H   Continuous Infusions:  sodium chloride     PRN Meds: sodium chloride, acetaminophen, diazepam, docusate sodium, ipratropium-albuterol, labetalol, morphine injection, ondansetron (ZOFRAN) IV, mouth rinse, oxyCODONE, phenol, polyethylene glycol, sodium chloride flush  Allergies:    Allergies  Allergen Reactions   Sulfa Antibiotics Other (See Comments)    Mouth sore and raw    Social History:   Social History   Socioeconomic History   Marital status: Married    Spouse name: Laranda, Jardines (Spouse) [phone] ([place])   Number of children: 4   Years of education: Not on file   Highest education level: 10th grade  Occupational History   Occupation: retired  Tobacco Use   Smoking status: Former    Current packs/day: 0.00    Average packs/day: 1.5 packs/day for 30.0 years (45.0 ttl pk-yrs)    Types: Cigarettes    Start date: 05/02/1965    Quit date: 05/03/1995    Years since quitting: 27.7    Passive exposure: Past   Smokeless tobacco: Never  Vaping Use   Vaping status: Never Used  Substance and Sexual Activity   Alcohol use: Not Currently    Alcohol/week: 4.0 standard drinks of alcohol    Types: 4 Shots of liquor per week    Comment: none   Drug use: No   Sexual activity: Not Currently    Birth control/protection: Post-menopausal  Other Topics Concern   Not on file  Social History Narrative   Not on file   Social Determinants of Health   Financial Resource Strain: Low Risk  (12/02/2021)   Overall Financial Resource Strain (CARDIA)    Difficulty of Paying Living Expenses: Not very hard  Food Insecurity: No Food Insecurity (12/05/2022)   Hunger Vital Sign    Worried About Running Out of Food in the Last Year: Never true    Ran Out of Food in the Last Year: Never true  Transportation Needs: No Transportation Needs (12/05/2022)   PRAPARE - Administrator, Civil Service (Medical): No     Lack of Transportation (Non-Medical): No  Physical Activity: Insufficiently Active (12/05/2022)   Exercise Vital Sign    Days of Exercise per Week: 3 days    Minutes of Exercise per Session: 30 min  Stress: Stress Concern Present (12/05/2022)   [employer] of Occupational Health - Occupational Stress Questionnaire    Feeling of Stress : Rather much  Social Connections: Moderately Isolated (  12/05/2022)   Social Connection and Isolation Panel [NHANES]    Frequency of Communication with Friends and Family: Three times a week    Frequency of Social Gatherings with Friends and Family: Twice a week    Attends Religious Services: Never    Database administrator or Organizations: No    Attends Banker Meetings: Never    Marital Status: Married  Catering manager Violence: Not At Risk (12/05/2022)   Humiliation, Afraid, Rape, and Kick questionnaire    Fear of Current or Ex-Partner: No    Emotionally Abused: No    Physically Abused: No    Sexually Abused: No    Family History:    Family History  Problem Relation Age of Onset   Diabetes Mother    Heart attack Mother    Cancer Sister        cancer of eyes, spread to lung, other places. died at 12   Cancer Sister 45       unknown primary, spread to bone, brain died in 19's   Cancer Paternal Aunt        unk type   Cancer Paternal Grandmother        type unk   Prostate cancer Neg Hx    Kidney cancer Neg Hx    Bladder Cancer Neg Hx    Breast cancer Neg Hx      ROS:  Please see the history of present illness.   All other ROS reviewed and negative.     Physical Exam/Data:   Vitals:   01/26/23 0645 01/26/23 0700 01/26/23 0739 01/26/23 0800  BP: (!) 85/56 (!) 88/55  136/77  Pulse: 72 73  (!) 116  Resp: 18 18  20   Temp:    98.3 F (36.8 C)  TempSrc:    Oral  SpO2: 97% 97% 98% 96%  Weight:      Height:        Intake/Output Summary (Last 24 hours) at 01/26/2023 1034 Last data filed at 01/26/2023 0800 Gross per 24  hour  Intake 1068.37 ml  Output 575 ml  Net 493.37 ml      01/26/2023    4:15 AM 01/25/2023   11:10 AM 01/16/2023    2:08 PM  Last 3 Weights  Weight (lbs) 137 lb 5.6 oz 132 lb 136 lb 6.4 oz  Weight (kg) 62.3 kg 59.875 kg 61.871 kg     Body mass index is 25.95 kg/m.  General:  Well nourished, well developed, in no acute distress HEENT: normal Neck: no JVD Vascular: No carotid bruits; Distal pulses 2+ bilaterally Cardiac:  normal S1, S2; Irreg IRreg; no murmur  Lungs:  clear to auscultation bilaterally, no wheezing, rhonchi or rales  Abd: soft, nontender, no hepatomegaly  Ext: no edema Musculoskeletal:  No deformities, BUE and BLE strength normal and equal Skin: warm and dry  Neuro:  CNs 2-12 intact, no focal abnormalities noted Psych:  Normal affect   EKG:  The EKG was personally reviewed and demonstrates:  pending Telemetry:  Telemetry was personally reviewed and demonstrates:  NSR>Afib/flutter 120s  Relevant CV Studies:  Cardiac CTA 11/2021 IMPRESSION: 1. Coronary calcium score of 135. This was 80th percentile for age and sex matched control.   2. Normal coronary origin with right dominance.   3. Calcified plaque causing mild stenosis (25-49%) in the proximal LAD and RCA.   4. CAD-RADS 2. Mild non-obstructive CAD (25-49%). Consider non-atherosclerotic causes of chest pain. Consider preventive therapy and risk  factor modification.   Electronically Signed: By: Denise Watts M.D. On: 12/09/2021 15:09  Echo 11/2021 1. Left ventricular ejection fraction, by estimation, is 55 to 60%. The  left ventricle has normal function. The left ventricle has no regional  wall motion abnormalities. Left ventricular diastolic parameters are  consistent with Grade I diastolic  dysfunction (impaired relaxation).   2. Right ventricular systolic function is normal. The right ventricular  size is normal. There is normal pulmonary artery systolic pressure. The  estimated right  ventricular systolic pressure is 29.4 mmHg.   3. The mitral valve is normal in structure. No evidence of mitral valve  regurgitation. No evidence of mitral stenosis.   4. The aortic valve is normal in structure. Aortic valve regurgitation is  not visualized. No aortic stenosis is present.   5. The inferior vena cava is normal in size with greater than 50%  respiratory variability, suggesting right atrial pressure of 3 mmHg.   Comparison(s): 12/11/20 EF 60%-65%.   Laboratory Data:  High Sensitivity Troponin:   Recent Labs  Lab 01/26/23 0757 01/26/23 0928  TROPONINIHS 73* 56*     Chemistry Recent Labs  Lab 01/25/23 1639 01/26/23 0511  NA 139 136  K 4.8 4.9  CL 106 106  CO2 20* 22  GLUCOSE 146* 167*  BUN 17 26*  CREATININE 0.73  0.71 0.96  CALCIUM 8.7* 8.7*  MG  --  2.3  GFRNONAA >60  >60 >60  ANIONGAP 13 8    Recent Labs  Lab 01/25/23 1639 01/26/23 0511  ALBUMIN 3.7 3.4*   Lipids  Recent Labs  Lab 01/26/23 0511  TRIG 97    Hematology Recent Labs  Lab 01/23/23 1317 01/25/23 1931 01/26/23 0511  WBC 4.8 9.4 8.0  RBC 4.21 4.13 4.06  HGB 13.5 13.3 12.9  HCT 40.9 39.7 40.1  MCV 97.1 96.1 98.8  MCH 32.1 32.2 31.8  MCHC 33.0 33.5 32.2  RDW 12.2 12.3 12.5  PLT 179 178 165   Thyroid No results for input(s): "TSH", "FREET4" in the last 168 hours.  BNPNo results for input(s): "BNP", "PROBNP" in the last 168 hours.  DDimer No results for input(s): "DDIMER" in the last 168 hours.   Radiology/Studies:  DG Chest Port 1 View  Result Date: 01/26/2023 CLINICAL DATA:  Pneumothorax EXAM: PORTABLE CHEST 1 VIEW COMPARISON:  Chest radiograph 1 day prior FINDINGS: The endotracheal tube tip is proximally 2.8 cm from the carina. The left basilar chest tube is stable. The enteric catheter tip is off the field of view but the side-hole projects over the stomach. Aeration of the left lung has significantly improved in the interim a 1.2 cm nodule projecting over the left  midlung is noted, better seen on recent chest CT. There is no new or worsening focal airspace disease. There is no significant effusion. There is a trace left apical pneumothorax. There is no significant right pneumothorax There is no acute osseous abnormality. IMPRESSION: 1. Trace left apical pneumothorax with chest tube in place. 2. Markedly improved aeration of the left lung. Electronically Signed   By: Lesia Hausen M.D.   On: 01/26/2023 09:12   DG Chest 1 View  Result Date: 01/25/2023 CLINICAL DATA:  [id number] Chest tube in place [id number] Encounter for imaging study to confirm orogastric (OG) tube placement [id number] EXAM: CHEST  1 VIEW; ABDOMEN - 1 VIEW COMPARISON:  Chest x-ray 01/25/2023 trauma CT chest 01/23/2023 FINDINGS: Endotracheal tube terminates 3.7 cm above the carina. Left chest tube  pigtail overlies the lower left hemithorax. Enteric tube courses below the hemidiaphragm with tip and side port overlying the gastric lumen. The heart and mediastinal contours are unchanged. Atherosclerotic plaque. Patient is rotated on chest x-ray. Diffuse interstitial and patchy airspace opacities at the left lung. At least trace left pleural effusion. Resolution of left pneumothorax. No right pleural effusion pain no pneumothorax. Nonobstructive bowel gas pattern. No acute osseous abnormality. IMPRESSION: 1. Endotracheal and enteric tubes are in good position 2. Resolution of left pneumothorax. At least trace left pleural effusion. Left chest tube pigtail overlies the lower left hemithorax. 3. Diffuse interstitial and patchy airspace opacities at the left lung. Electronically Signed   By: Tish Frederickson M.D.   On: 01/25/2023 18:03   DG Abd 1 View  Result Date: 01/25/2023 CLINICAL DATA:  [id number] Chest tube in place [id number] Encounter for imaging study to confirm orogastric (OG) tube placement [id number] EXAM: CHEST  1 VIEW; ABDOMEN - 1 VIEW COMPARISON:  Chest x-ray 01/25/2023 trauma CT chest 01/23/2023  FINDINGS: Endotracheal tube terminates 3.7 cm above the carina. Left chest tube pigtail overlies the lower left hemithorax. Enteric tube courses below the hemidiaphragm with tip and side port overlying the gastric lumen. The heart and mediastinal contours are unchanged. Atherosclerotic plaque. Patient is rotated on chest x-ray. Diffuse interstitial and patchy airspace opacities at the left lung. At least trace left pleural effusion. Resolution of left pneumothorax. No right pleural effusion pain no pneumothorax. Nonobstructive bowel gas pattern. No acute osseous abnormality. IMPRESSION: 1. Endotracheal and enteric tubes are in good position 2. Resolution of left pneumothorax. At least trace left pleural effusion. Left chest tube pigtail overlies the lower left hemithorax. 3. Diffuse interstitial and patchy airspace opacities at the left lung. Electronically Signed   By: Tish Frederickson M.D.   On: 01/25/2023 18:03   DG Chest Port 1 View  Result Date: 01/25/2023 CLINICAL DATA:  Status post bronchoscopy EXAM: PORTABLE CHEST 1 VIEW COMPARISON:  Chest x-ray 01/25/2023. FINDINGS: Large left pneumothorax with rightward mediastinal shift. Opacities within the compressed left lung likely are in part due to compressive atelectasis. Known nodules are not well evaluated on this study. Right lung is clear. No visible pleural effusions. No acute bony abnormality. IMPRESSION: 1. Large left pneumothorax with rightward mediastinal shift. 2. The clinical team is aware of this finding given a chest tube has already been placed on a subsequent chest x-ray visible at this time. Electronically Signed   By: Feliberto Harts M.D.   On: 01/25/2023 16:42   DG C-Arm 1-60 Min-No Report  Result Date: 01/25/2023 Fluoroscopy was utilized by the requesting physician.  No radiographic interpretation.   DG C-Arm 1-60 Min-No Report  Result Date: 01/25/2023 Fluoroscopy was utilized by the requesting physician.  No radiographic  interpretation.     Assessment and Plan:   New onset Afib - postprocedure patient developed new onset Afib/flutter with elevated rates - rates slowly improving without intervention - CHADSVASC at lest 35 (female, age , PAD). She will require long-term a/c, can start IV heparin in the interim - Bps improved, would start BB therapy for rate control - check echo - Keep Mag>2 and K>4 - THS pending - patient is overall asymptomatic, if patient does not self convert may need TEE/DCCV, can possibly be as outpatient  Elevated troponin Nonobstructive CAD - HS trop 73>56 - IV heparin - Cardiac CTA in 2023 showed mild nonobstructive CAD - echo ordered - continue Crestor and BB  HLD - LDL  68 in 2023 - continue Crestor  For questions or updates, please contact [facility] Please consult [email or web address] for contact info under    Signed, Matsuko Kretz David Stall, PA-C  01/26/2023 10:34 AM

## 2023-01-26 NOTE — Consult Note (Signed)
ANTICOAGULATION CONSULT NOTE - Initial Consult  Pharmacy Consult for Heparin Indication: atrial fibrillation  Allergies  Allergen Reactions   Sulfa Antibiotics Other (See Comments)    Mouth sore and raw    Patient Measurements: Height: 5\' 1"  (154.9 cm) Weight: 62.3 kg (137 lb 5.6 oz) IBW/kg (Calculated) : 47.8 Heparin Dosing Weight: 60.5 kg  Vital Signs: Temp: 98.3 F (36.8 C) (09/26 0800) Temp Source: Oral (09/26 0800) BP: 136/77 (09/26 0800) Pulse Rate: 116 (09/26 0800)  Labs: Recent Labs    01/23/23 1317 01/25/23 1639 01/25/23 1931 01/26/23 0511 01/26/23 0757 01/26/23 0928  HGB 13.5  --  13.3 12.9  --   --   HCT 40.9  --  39.7 40.1  --   --   PLT 179  --  178 165  --   --   APTT 26  --   --   --   --   --   LABPROT 13.8  --   --   --   --   --   INR 1.0  --   --   --   --   --   CREATININE  --  0.73  0.71  --  0.96  --   --   TROPONINIHS  --   --   --   --  73* 56*    Estimated Creatinine Clearance: 46.8 mL/min (by C-G formula based on SCr of 0.96 mg/dL).   Medical History: 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   COPD (chronic obstructive pulmonary disease) (HCC)    Coronary artery disease    Cough 03/02/2017   INTERMTTENT DRY COUGH   Depression    Dyspnea    GERD (gastroesophageal reflux disease)    History of hiatal hernia    Personal history of chemotherapy    Personal history of radiation therapy    Skin cancer    Throat cancer (HCC) 1998   radiation   Wears dentures    full upper and lower    Medications:  Medications Prior to Admission  Medication Sig Dispense Refill Last Dose   alum & mag hydroxide-simeth (MAALOX PLUS) 400-400-40 MG/5ML suspension Take 15 mLs by mouth every 6 (six) hours as needed for  indigestion. 355 mL 0 01/24/2023   aspirin EC 81 MG tablet Take 1 tablet (81 mg total) by mouth daily. Swallow whole. 90 tablet 3 01/19/2023   buPROPion (WELLBUTRIN XL) 150 MG 24 hr tablet Take 3 tablets (450 mg total) by mouth daily. 90 tablet 1 01/25/2023   busPIRone (BUSPAR) 5 MG tablet Take 1 tablet (5 mg total) by mouth 3 (three) times daily. 270 tablet 0 01/25/2023   cyclobenzaprine (FLEXERIL) 5 MG tablet TAKE 1 TABLET BY MOUTH AT BEDTIME 30 tablet 0 01/23/2023   DULoxetine (CYMBALTA) 60 MG capsule Take 1 capsule (60 mg total) by mouth 2 (two) times daily. 180 capsule 0 01/25/2023   gabapentin (NEURONTIN) 300 MG capsule TAKE 1 CAPSULE BY MOUTH 3 TIMES DAILY 60 capsule 5 01/25/2023   montelukast (SINGULAIR) 10 MG tablet Take 1 tablet (10 mg total) by mouth at bedtime. 90 tablet 3 01/23/2023   omeprazole (PRILOSEC) 40 MG capsule TAKE 1 CAPSULE BY MOUTH DAILY 90 capsule  0 01/25/2023   oxyCODONE (ROXICODONE) 5 MG immediate release tablet Take 1 tablet (5 mg total) by mouth every 6 (six) hours as needed for severe pain or moderate pain. 4 tablet 0 Past Week   rOPINIRole (REQUIP) 0.25 MG tablet TAKE 1 TABLET BY MOUTH AT BEDTIME 90 tablet 1 01/23/2023   rosuvastatin (CRESTOR) 40 MG tablet Take 1 tablet (40 mg total) by mouth daily. 90 tablet 3 01/23/2023    Assessment: Denise Watts is a 67 y.o. female admitted for elective bronchoscopy, procedure complicated by left-sided pneumothorax and ARDS. PMH includes COPD, chronic cough, CAD, hiatal hernia, vocal cord cancer, breast cancer, anxiety, and major depressive disorder.   Pharmacy has been consulted for heparin dosing and monitoring. Patient was not on anticoagulation prior to admission so monitoring heparin levels only is reasonable.  Baseline Labs: --aPTT (9/23): 26 --INR (9/23): 1.0  Goal of Therapy:  Heparin level 0.3-0.7 units/ml Monitor platelets by anticoagulation protocol: Yes   Plan:  Start heparin infusion at 850 units/hr, no bolus given  recent procedures Check anti-Xa level in 6 hours and daily while on heparin Continue to monitor H&H and platelets  Darolyn Rua, PharmD Student [employer]

## 2023-01-27 ENCOUNTER — Inpatient Hospital Stay: Payer: Medicare HMO

## 2023-01-27 ENCOUNTER — Inpatient Hospital Stay (HOSPITAL_COMMUNITY)
Admission: RE | Admit: 2023-01-27 | Discharge: 2023-01-27 | Disposition: A | Discharge status: Home / Self Care | Payer: Medicare HMO | Source: Home / Self Care | Attending: Pulmonary Disease | Admitting: Internal Medicine

## 2023-01-27 DIAGNOSIS — I4891 Unspecified atrial fibrillation: Secondary | ICD-10-CM | POA: Diagnosis not present

## 2023-01-27 DIAGNOSIS — I480 Paroxysmal atrial fibrillation: Secondary | ICD-10-CM | POA: Diagnosis not present

## 2023-01-27 LAB — THYROID PANEL WITH TSH
Free Thyroxine Index: 2.4 (ref 1.2–4.9)
T3 Uptake Ratio: 27 % (ref 24–39)
T4, Total: 8.8 ug/dL (ref 4.5–12.0)
TSH: 0.452 u[IU]/mL (ref 0.450–4.500)

## 2023-01-27 LAB — CBC
HCT: 39.2 % (ref 36.0–46.0)
HCT: 39.2 % (ref 36.0–46.0)
Hemoglobin: 12.6 g/dL (ref 12.0–15.0)
Hemoglobin: 12.4 g/dL (ref 12.0–15.0)
MCH: 32.4 pg (ref 26.0–34.0)
MCH: 32 pg (ref 26.0–34.0)
MCHC: 32.1 g/dL (ref 30.0–36.0)
MCHC: 31.6 g/dL (ref 30.0–36.0)
MCV: 100.8 fL — ABNORMAL HIGH (ref 80.0–100.0)
MCV: 101 fL — ABNORMAL HIGH (ref 80.0–100.0)
Platelets: 156 10*3/uL (ref 150–400)
Platelets: 150 10*3/uL (ref 150–400)
RBC: 3.89 MIL/uL (ref 3.87–5.11)
RBC: 3.88 MIL/uL (ref 3.87–5.11)
RDW: 12.8 % (ref 11.5–15.5)
RDW: 12.8 % (ref 11.5–15.5)
WBC: 9.4 10*3/uL (ref 4.0–10.5)
WBC: 9.3 10*3/uL (ref 4.0–10.5)
nRBC: 0 % (ref 0.0–0.2)
nRBC: 0 % (ref 0.0–0.2)

## 2023-01-27 LAB — ECHOCARDIOGRAM COMPLETE
Area-P 1/2: 4.49 cm2
Height: 61 in
S' Lateral: 2.6 cm
Weight: 2194.02 [oz_av]

## 2023-01-27 LAB — RENAL FUNCTION PANEL
Albumin: 3.5 g/dL (ref 3.5–5.0)
Anion gap: 8 (ref 5–15)
BUN: 33 mg/dL — ABNORMAL HIGH (ref 8–23)
CO2: 30 mmol/L (ref 22–32)
Calcium: 8.9 mg/dL (ref 8.9–10.3)
Chloride: 103 mmol/L (ref 98–111)
Creatinine, Ser: 0.88 mg/dL (ref 0.44–1.00)
GFR, Estimated: >60 mL/min (ref >60)
Glucose, Bld: 114 mg/dL — ABNORMAL HIGH (ref 70–99)
Phosphorus: 3.4 mg/dL (ref 2.5–4.6)
Potassium: 4.8 mmol/L (ref 3.5–5.1)
Sodium: 141 mmol/L (ref 135–145)

## 2023-01-27 LAB — CYTOLOGY - NON PAP

## 2023-01-27 LAB — MAGNESIUM: Magnesium: 2.3 mg/dL (ref 1.7–2.4)

## 2023-01-27 LAB — SURGICAL PATHOLOGY

## 2023-01-27 MED ORDER — AMOXICILLIN-POT CLAVULANATE 875-125 MG PO TABS
1.0000 | ORAL_TABLET | Freq: Two times a day (BID) | ORAL | Status: DC
  Administered 2023-01-27 – 2023-01-28 (×3): 1 via ORAL
  Filled 2023-01-27 (×3): qty 1

## 2023-01-27 MED ORDER — LACTATED RINGERS IV BOLUS
500.0000 mL | Freq: Once | INTRAVENOUS | Status: AC
  Administered 2023-01-27: 500 mL via INTRAVENOUS

## 2023-01-27 MED ORDER — HYDROCODONE-ACETAMINOPHEN 7.5-325 MG PO TABS
1.0000 | ORAL_TABLET | Freq: Three times a day (TID) | ORAL | Status: DC | PRN
  Administered 2023-01-27 – 2023-01-28 (×4): 1 via ORAL
  Filled 2023-01-27 (×4): qty 1

## 2023-01-27 MED ORDER — BENZONATATE 100 MG PO CAPS
100.0000 mg | ORAL_CAPSULE | Freq: Three times a day (TID) | ORAL | Status: DC | PRN
  Administered 2023-01-27 (×2): 100 mg via ORAL
  Filled 2023-01-27 (×2): qty 1

## 2023-01-27 NOTE — Progress Notes (Signed)
Rounding Note    Patient Name: Denise Watts Date of Encounter: 01/27/2023  [facility] Cardiologist: Debbe Odea, MD   Subjective   Patient remains in NSR. She still has a chest tube, may get it out today. No chest pain reported.   Inpatient Medications    Scheduled Meds:  amoxicillin-clavulanate  1 tablet Oral Q12H   aspirin EC  81 mg Oral Daily   buPROPion  450 mg Oral Daily   busPIRone  5 mg Oral TID   Chlorhexidine Gluconate Cloth  6 each Topical Daily   cyclobenzaprine  5 mg Oral QHS   dextromethorphan-guaiFENesin  1 tablet Oral BID   DULoxetine  60 mg Oral BID   feeding supplement  237 mL Oral TID BM   gabapentin  300 mg Oral TID   ipratropium-albuterol  3 mL Nebulization Q6H   metoprolol tartrate  25 mg Oral BID   montelukast  10 mg Oral QHS   multivitamin with minerals  1 tablet Oral Daily   pantoprazole  40 mg Oral Daily   rOPINIRole  0.25 mg Oral QHS   rosuvastatin  40 mg Oral Daily   sodium chloride flush  3 mL Intravenous Q12H   Continuous Infusions:  sodium chloride     PRN Meds: sodium chloride, acetaminophen, diazepam, docusate sodium, HYDROcodone-acetaminophen, ipratropium-albuterol, labetalol, morphine injection, ondansetron (ZOFRAN) IV, mouth rinse, oxyCODONE, phenol, polyethylene glycol, sodium chloride flush   Vital Signs    Vitals:   01/27/23 0700 01/27/23 0725 01/27/23 0800 01/27/23 0900  BP: (!) 98/53  (!) 89/51 134/72  Pulse: 78 80 83 89  Resp: 14 11 15  (!) 27  Temp:      TempSrc:      SpO2: 95% 96% 94% 93%  Weight:      Height:        Intake/Output Summary (Last 24 hours) at 01/27/2023 0942 Last data filed at 01/27/2023 0200 Gross per 24 hour  Intake 480 ml  Output 855 ml  Net -375 ml      01/27/2023    3:05 AM 01/26/2023    4:15 AM 01/25/2023   11:10 AM  Last 3 Weights  Weight (lbs) 137 lb 2 oz 137 lb 5.6 oz 132 lb  Weight (kg) 62.2 kg 62.3 kg 59.875 kg      Telemetry    NSR PACs, HR 80s - Personally  Reviewed  ECG    No new - Personally Reviewed  Physical Exam   GEN: No acute distress.   Neck: No JVD Cardiac: RRR, no murmurs, rubs, or gallops.  Respiratory: course breath sounds, wheezing. GI: Soft, nontender, non-distended  MS: No edema; No deformity. Neuro:  Nonfocal  Psych: Normal affect   Labs    High Sensitivity Troponin:   Recent Labs  Lab 01/26/23 0757 01/26/23 0928  TROPONINIHS 73* 56*     Chemistry Recent Labs  Lab 01/25/23 1639 01/26/23 0511 01/27/23 0503  NA 139 136 141  K 4.8 4.9 4.8  CL 106 106 103  CO2 20* 22 30  GLUCOSE 146* 167* 114*  BUN 17 26* 33*  CREATININE 0.73  0.71 0.96 0.88  CALCIUM 8.7* 8.7* 8.9  MG  --  2.3 2.3  ALBUMIN 3.7 3.4* 3.5  GFRNONAA >60  >60 >60 >60  ANIONGAP 13 8 8     Lipids  Recent Labs  Lab 01/26/23 0511  TRIG 97    Hematology Recent Labs  Lab 01/25/23 1931 01/26/23 0511 01/27/23 0503  WBC  9.4 8.0 9.4  RBC 4.13 4.06 3.89  HGB 13.3 12.9 12.6  HCT 39.7 40.1 39.2  MCV 96.1 98.8 100.8*  MCH 32.2 31.8 32.4  MCHC 33.5 32.2 32.1  RDW 12.3 12.5 12.8  PLT 178 165 156   Thyroid  Recent Labs  Lab 01/26/23 0757  TSH 0.452    BNPNo results for input(s): "BNP", "PROBNP" in the last 168 hours.  DDimer No results for input(s): "DDIMER" in the last 168 hours.   Radiology    DG Chest Port 1 View  Result Date: 01/27/2023 CLINICAL DATA:  Follow-up pneumothorax. EXAM: PORTABLE CHEST 1 VIEW COMPARISON:  Chest radiograph 01/26/2023 FINDINGS: The cardiac silhouette is mildly enlarged. A left-sided chest tube remains in place, terminating over the lateral left lung base. A small to moderate-sized left apical pneumothorax has mildly decreased in size. Lung volumes are lower than on the prior study with increased patchy opacities in the left greater than right lung bases. Left lower lung nodules are again noted. No sizable pleural effusion is identified. IMPRESSION: 1. Mildly decreased size of left apical pneumothorax.  2. Decreased lung volumes with increased bibasilar atelectasis or infiltrates. Electronically Signed   By: Sebastian Ache M.D.   On: 01/27/2023 08:53   DG Chest Port 1 View  Result Date: 01/26/2023 CLINICAL DATA:  Chest tube EXAM: PORTABLE CHEST 1 VIEW COMPARISON:  01/26/2023, CT 01/23/2023 FINDINGS: Removal of endotracheal and esophageal tubes. Left lower chest drainage catheter with pigtail at the CP angle. Left lower lung nodule. Interval increase in size of left apical pneumothorax, now demonstrating 3.4 cm of pleural-parenchymal separation IMPRESSION: Interval increase in size of left apical pneumothorax now demonstrating up to 3.4 cm pleural-parenchymal separation at the apex. Left lower chest drainage catheter remains in place. These results will be called to the ordering clinician or representative by the Radiologist Assistant, and communication documented in the PACS or [employer]. Electronically Signed   By: Jasmine Pang M.D.   On: 01/26/2023 19:05   DG Chest Port 1 View  Result Date: 01/26/2023 CLINICAL DATA:  Pneumothorax EXAM: PORTABLE CHEST 1 VIEW COMPARISON:  Chest radiograph 1 day prior FINDINGS: The endotracheal tube tip is proximally 2.8 cm from the carina. The left basilar chest tube is stable. The enteric catheter tip is off the field of view but the side-hole projects over the stomach. Aeration of the left lung has significantly improved in the interim a 1.2 cm nodule projecting over the left midlung is noted, better seen on recent chest CT. There is no new or worsening focal airspace disease. There is no significant effusion. There is a trace left apical pneumothorax. There is no significant right pneumothorax There is no acute osseous abnormality. IMPRESSION: 1. Trace left apical pneumothorax with chest tube in place. 2. Markedly improved aeration of the left lung. Electronically Signed   By: Lesia Hausen M.D.   On: 01/26/2023 09:12   DG Chest 1 View  Result Date:  01/25/2023 CLINICAL DATA:  [id number] Chest tube in place [id number] Encounter for imaging study to confirm orogastric (OG) tube placement [id number] EXAM: CHEST  1 VIEW; ABDOMEN - 1 VIEW COMPARISON:  Chest x-ray 01/25/2023 trauma CT chest 01/23/2023 FINDINGS: Endotracheal tube terminates 3.7 cm above the carina. Left chest tube pigtail overlies the lower left hemithorax. Enteric tube courses below the hemidiaphragm with tip and side port overlying the gastric lumen. The heart and mediastinal contours are unchanged. Atherosclerotic plaque. Patient is rotated on chest x-ray. Diffuse interstitial and  patchy airspace opacities at the left lung. At least trace left pleural effusion. Resolution of left pneumothorax. No right pleural effusion pain no pneumothorax. Nonobstructive bowel gas pattern. No acute osseous abnormality. IMPRESSION: 1. Endotracheal and enteric tubes are in good position 2. Resolution of left pneumothorax. At least trace left pleural effusion. Left chest tube pigtail overlies the lower left hemithorax. 3. Diffuse interstitial and patchy airspace opacities at the left lung. Electronically Signed   By: Tish Frederickson M.D.   On: 01/25/2023 18:03   DG Abd 1 View  Result Date: 01/25/2023 CLINICAL DATA:  [id number] Chest tube in place [id number] Encounter for imaging study to confirm orogastric (OG) tube placement [id number] EXAM: CHEST  1 VIEW; ABDOMEN - 1 VIEW COMPARISON:  Chest x-ray 01/25/2023 trauma CT chest 01/23/2023 FINDINGS: Endotracheal tube terminates 3.7 cm above the carina. Left chest tube pigtail overlies the lower left hemithorax. Enteric tube courses below the hemidiaphragm with tip and side port overlying the gastric lumen. The heart and mediastinal contours are unchanged. Atherosclerotic plaque. Patient is rotated on chest x-ray. Diffuse interstitial and patchy airspace opacities at the left lung. At least trace left pleural effusion. Resolution of left pneumothorax. No right pleural  effusion pain no pneumothorax. Nonobstructive bowel gas pattern. No acute osseous abnormality. IMPRESSION: 1. Endotracheal and enteric tubes are in good position 2. Resolution of left pneumothorax. At least trace left pleural effusion. Left chest tube pigtail overlies the lower left hemithorax. 3. Diffuse interstitial and patchy airspace opacities at the left lung. Electronically Signed   By: Tish Frederickson M.D.   On: 01/25/2023 18:03   DG Chest Port 1 View  Result Date: 01/25/2023 CLINICAL DATA:  Status post bronchoscopy EXAM: PORTABLE CHEST 1 VIEW COMPARISON:  Chest x-ray 01/25/2023. FINDINGS: Large left pneumothorax with rightward mediastinal shift. Opacities within the compressed left lung likely are in part due to compressive atelectasis. Known nodules are not well evaluated on this study. Right lung is clear. No visible pleural effusions. No acute bony abnormality. IMPRESSION: 1. Large left pneumothorax with rightward mediastinal shift. 2. The clinical team is aware of this finding given a chest tube has already been placed on a subsequent chest x-ray visible at this time. Electronically Signed   By: Feliberto Harts M.D.   On: 01/25/2023 16:42   DG C-Arm 1-60 Min-No Report  Result Date: 01/25/2023 Fluoroscopy was utilized by the requesting physician.  No radiographic interpretation.   DG C-Arm 1-60 Min-No Report  Result Date: 01/25/2023 Fluoroscopy was utilized by the requesting physician.  No radiographic interpretation.    Cardiac Studies   Echo pending  Patient Profile     68 y.o. female with a hx of COPD, chronic cough, nonobstructive CAD (mild LAD and RCA stenosis), hiatal hernia, vocal cord cancer, breast cancer, anxiety disorder, MDD who is being seen 01/26/2023 for the evaluation of new onset afib   Assessment & Plan    New onset Afib - postprocedure patient developed new onset Afib/flutter, but mostly in ST with PACs - rates improved with addition of BB - CHADSVASC at lest  62 (female, age , PAD) plan to hold on a/c given inciting event -echo pending - Keep Mag>2 and K>4 - TSH pending   Elevated troponin Nonobstructive CAD - HS trop 73>56 - IV heparin - Cardiac CTA in 2023 showed mild nonobstructive CAD - echo ordered - continue Crestor and BB   HLD - LDL 68 in 2023 - continue Crestor  For questions or updates, please contact  [facility] Please consult [email or web address] for contact info under        Signed, Aeisha Minarik David Stall, PA-C  01/27/2023, 9:42 AM

## 2023-01-27 NOTE — Progress Notes (Signed)
Order received from Dr. Karna Christmas to clamp chest tube.

## 2023-01-27 NOTE — Progress Notes (Signed)
PT Cancellation Note  Patient Details Name: Denise Watts MRN: [id number] DOB: [birth year]   Cancelled Treatment:     PT attempt. PT hold. Pt asleep upon arrival. Turned lights on and pt easily awakes but remains lethargic. She C/O inability to stay awake and feeling dizzy. BP is hypotensive with lowest MAP of 61 while author in room checking BP. Acute PT will continue to follow and progress as able per pt tolerance. Author will return later if/when pt is more appropriate to participate.    Rushie Chestnut 01/27/2023, 1:10 PM

## 2023-01-27 NOTE — Progress Notes (Signed)
NAME:  Denise Watts, MRN:  [id number], DOB:  Sep 10, 1955, LOS: 2 ADMISSION DATE:  01/25/2023, CONSULTATION DATE:  01/25/2023 REFERRING MD:  Dr. Karna Christmas, CHIEF COMPLAINT:  Acute Hypoxic Respiratory Failure, Pneumothorax   Brief Pt Description / Synopsis:  67 y.o female with PMHx significant for COPD, vocal cord and breast cancer who underwent elective Bronchoscopy for evaluation of lung nodules.  Procedure complicated by development of acute large left sided Pneumothorax with acute hypoxic respiratory failure requiring intubation, mechanical ventilation, and chest tube placement.  History of Present Illness:  Denise Watts is a 67 year old female with a past medical history significant for COPD, chronic cough, CAD, hiatal hernia, vocal cord cancer, breast cancer, anxiety disorder, major depressive disorder who presented to [facility] on 01/25/2023 for elective bronchoscopy for evaluation of lung nodules.  01/27/23- patient seen and evaluated at bedside.  She is improved, intermittent chronic cough causing pain at chest tube site, we have ordered PO norco for adequate analgesia.  CXR with resolution of left pneumothorax.  Chest tube management with no air leak.  Plan to advance to water seal with repeat imaging and removal of pigtail.    Pertinent  Medical History   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   COPD (chronic obstructive pulmonary disease) (HCC)    Coronary artery disease    Cough 03/02/2017   INTERMTTENT DRY COUGH   Depression    Dyspnea    GERD (gastroesophageal reflux disease)    History of hiatal hernia    Personal history of chemotherapy    Personal history of radiation therapy    Skin cancer    Throat cancer (HCC) 1998   radiation   Wears dentures     full upper and lower     Micro Data:  N/A  Antimicrobials:   Anti-infectives (From admission, onward)    Start     Dose/Rate Route Frequency Ordered Stop   01/27/23 0900  amoxicillin-clavulanate (AUGMENTIN) 875-125 MG per tablet 1 tablet        1 tablet Oral Every 12 hours 01/27/23 0810 02/01/23 0959       Significant Hospital Events: Including procedures, antibiotic start and stop dates in addition to other pertinent events   9/25: Presented for elective Bronchoscopy and lung biopsy.  Procedure complicated by large left sided PTX and acute hypoxic respiratory failure requiring intubation in PACU.  Dr. Karna Christmas placing emergent chest tube.  Transfer to ICU, PCCM consulted. 9/26: Resolution of PTX on X-ray.  EXTUBATED. New onset A.fib during SBT/extubation, Obtain Echocardiogram and consult Cardiology.  Following extubation will perform clamp trial of chest tube to evaluate for possible removal of chest tube.    Objective   Blood pressure (!) 98/53, pulse 78, temperature 98.6 F (37 C), temperature source Oral, resp. rate 14, height 5\' 1"  (1.549 m), weight 62.2 kg, SpO2 95%.        Intake/Output Summary (Last 24 hours) at 01/27/2023 0822 Last data filed at 01/27/2023 0200 Gross per 24 hour  Intake 480 ml  Output 855 ml  Net -375 ml   Filed Weights   01/25/23 1110 01/26/23 0415 01/27/23 0305  Weight: 59.9 kg 62.3 kg 62.2 kg    Examination: General: Acute on chronically ill-appearing  female, sitting up in bed eating breakfast HENT: Atraumatic, normocephalic, neck supple, no JVD Lungs: Coarse throughout, even, nonlabored, normal effort, overbreathing the vent Cardiovascular: Tachycardia, irregularly irregular rhythm, no murmurs, rubs, gallops Abdomen: Soft, nontender, nondistended, no guarding rebound tenderness, bowel sounds positive x 4 Extremities: Normal bulk and tone, no deformities, no edema Neuro: Off sedation, following commands no focal deficits noted nodding to  questions, pupils PERRLA GU: External female catheter in place  Resolved Hospital Problem list     Assessment & Plan:   #Acute Hypoxic Respiratory Failure  #Acute Pneumothorax following Bronchoscopy with lung biopsy #COPD without acute exacerbation PMHx: COPD, Multiple lung nodules with hx of vocal cord and breast cancer  -Follow intermittent Chest X-ray & ABG as needed -Spontaneous Breathing Trials when respiratory parameters met and mental status permits -Implement VAP Bundle -Bronchodilators & Pulmicort nebs -Continue Chest tube to suction ~ clamp tube and obtain CXR later in afternoon to evaluate for possible removal of tube -Follow up Cytology results  #New onset Atrial Fibrillation #Hypertension PMHx: CAD -Continuous cardiac monitoring -Maintain MAP >65 -Vasopressors as needed to maintain MAP goal ~ not requiring -HS Troponin peaked at 73 -Check Thyroid panel -Echocardiogram pending -Consult Cardiology, appreciate input  #Anxiety  #Depression -Maintain a RASS goal of 0 to -1 -Fentanyl and Propofol as needed to maintain RASS goal -Avoid sedating medications as able -Daily wake up assessment -Once able to tolerate PO will resume Wellbutrin, Buspar, Flexeril, Cymbalta, Gabapentin, and Requip     Best Practice (right click and "Reselect all SmartList Selections" daily)   Diet/type: NPO pending bedside swallow evaluation DVT prophylaxis: Heparin SQ GI prophylaxis: PPI Lines: N/A Foley:  N/A Code Status:  full code Last date of multidisciplinary goals of care discussion [9/26]  9/26: Pt and her daughter updated at bedside.  Labs   CBC: Recent Labs  Lab 01/23/23 1317 01/25/23 1931 01/26/23 0511 01/27/23 0503  WBC 4.8 9.4 8.0 9.4  HGB 13.5 13.3 12.9 12.6  HCT 40.9 39.7 40.1 39.2  MCV 97.1 96.1 98.8 100.8*  PLT 179 178 165 156    Basic Metabolic Panel: Recent Labs  Lab 01/25/23 1639 01/26/23 0511 01/27/23 0503  NA 139 136 141  K 4.8 4.9 4.8   CL 106 106 103  CO2 20* 22 30  GLUCOSE 146* 167* 114*  BUN 17 26* 33*  CREATININE 0.73  0.71 0.96 0.88  CALCIUM 8.7* 8.7* 8.9  MG  --  2.3 2.3  PHOS 4.2 4.2 3.4   GFR: Estimated Creatinine Clearance: 51.1 mL/min (by C-G formula based on SCr of 0.88 mg/dL). Recent Labs  Lab 01/23/23 1317 01/25/23 1931 01/26/23 0511 01/27/23 0503  WBC 4.8 9.4 8.0 9.4    Liver Function Tests: Recent Labs  Lab 01/25/23 1639 01/26/23 0511 01/27/23 0503  ALBUMIN 3.7 3.4* 3.5   No results for input(s): "LIPASE", "AMYLASE" in the last 168 hours. No results for input(s): "AMMONIA" in the last 168 hours.  ABG    Component Value Date/Time   PHART 7.44 01/25/2023 2103   PCO2ART 41 01/25/2023 2103   PO2ART 89 01/25/2023 2103   HCO3 27.8 01/25/2023 2103   ACIDBASEDEF 2.4 (H) 01/25/2023 1610   O2SAT 99.2 01/25/2023 2103     Coagulation Profile: Recent Labs  Lab 01/23/23 1317  INR 1.0    Cardiac Enzymes: No results for input(s): "CKTOTAL", "CKMB", "CKMBINDEX", "TROPONINI" in the last 168 hours.  HbA1C: Hgb A1c MFr Bld  Date/Time Value Ref Range Status  11/03/2021 03:51  PM 5.7 (H) 4.8 - 5.6 % Final    Comment:             Prediabetes: 5.7 - 6.4          Diabetes: >6.4          Glycemic control for adults with diabetes: <7.0   05/26/2021 11:01 AM 5.6 4.8 - 5.6 % Final    Comment:             Prediabetes: 5.7 - 6.4          Diabetes: >6.4          Glycemic control for adults with diabetes: <7.0     CBG: Recent Labs  Lab 01/25/23 2319 01/26/23 0321 01/26/23 0737 01/26/23 1119 01/26/23 1920  GLUCAP 198* 161* 125* 121* 210*    Review of Systems:   Unable to assess due to intubation/sedation/AMS   Past Medical History:  She,  has a past medical history of Anxiety, Arthritis, Breast cancer (HCC) (1997), Breast cancer (HCC) (11/15/2017), 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HCC) (11/10/2014), COPD (chronic obstructive pulmonary disease) (HCC), Coronary artery disease, Cough  (03/02/2017), Depression, Dyspnea, GERD (gastroesophageal reflux disease), History of hiatal hernia, Personal history of chemotherapy, Personal history of radiation therapy, Skin cancer, Throat cancer (HCC) (1998), and Wears dentures.   Surgical History:   Past Surgical History:  Procedure Laterality Date   BIOPSY  01/05/2023   Procedure: BIOPSY;  Surgeon: Toney Reil, MD;  Location: [facility];  Service: Gastroenterology;;   BREAST BIOPSY Right 1997   positive   BREAST BIOPSY Right 11/15/2017   INVASIVE MAMMARY CARCINOMA triple negative   BREAST BIOPSY Right 07/20/2020   [place] bx, Q marker, neg   BREAST LUMPECTOMY Right 1997   2019 also   BREAST LUMPECTOMY WITH SENTINEL LYMPH NODE BIOPSY Right 12/08/2017   Procedure: BREAST LUMPECTOMY WITH SENTINEL LYMPH NODE BX;  Surgeon: Earline Mayotte, MD;  Location: [facility] OR[facility];  Service: General;  Laterality: Right;   BREAST REDUCTION WITH MASTOPEXY Left 06/10/2019   Procedure: Left breast mastopexy reduction for symmetry;  Surgeon: Peggye Form, DO;  Location: [facility];  Service: Plastics;  Laterality: Left;  total 2.5 hours   CATARACT EXTRACTION W/PHACO Right 02/07/2022   Procedure: CATARACT EXTRACTION PHACO AND INTRAOCULAR LENS PLACEMENT (IOC) RIGHT DIABETIC;  Surgeon: Nevada Crane, MD;  Location: [facility];  Service: Ophthalmology;  Laterality: Right;  3.60 00:34.7   CATARACT EXTRACTION W/PHACO Left 02/28/2022   Procedure: CATARACT EXTRACTION PHACO AND INTRAOCULAR LENS PLACEMENT (IOC) LEFT DIABETIC 4.11 00:26.1;  Surgeon: Nevada Crane, MD;  Location: [facility];  Service: Ophthalmology;  Laterality: Left;   COLONOSCOPY  2015   COLONOSCOPY N/A 01/05/2023   Procedure: COLONOSCOPY;  Surgeon: Toney Reil, MD;  Location: [facility];  Service: Gastroenterology;  Laterality: N/A;   COLONOSCOPY WITH PROPOFOL N/A 10/29/2019   Procedure: COLONOSCOPY WITH PROPOFOL;  Surgeon: Midge Minium, MD;  Location[facility];  Service: Endoscopy;  Laterality: N/A;   ESOPHAGOGASTRODUODENOSCOPY (EGD) WITH PROPOFOL N/A 05/24/2019   Procedure: ESOPHAGOGASTRODUODENOSCOPY (EGD) WITH PROPOFOL;  Surgeon: Midge Minium, MD;  Location: [facility] ENDOSCOPY;  Service: Endoscopy;  Laterality: N/A;   ESOPHAGOGASTRODUODENOSCOPY (EGD) WITH PROPOFOL N/A 09/06/2022   Procedure: ESOPHAGOGASTRODUODENOSCOPY (EGD) WITH PROPOFOL;  Surgeon: Midge Minium, MD;  Location: [facility] ENDOSCOPY;  Service: Endoscopy;  Laterality: N/A;   ESOPHAGOGASTRODUODENOSCOPY (EGD) WITH PROPOFOL  01/05/2023   Procedure: ESOPHAGOGASTRODUODENOSCOPY (EGD) WITH PROPOFOL;  Surgeon: Toney Reil, MD;  Location:  [facility] ENDOSCOPY;  Service: Gastroenterology;;   ESOPHAGUS SURGERY     KNEE SURGERY Right    LIPOSUCTION WITH LIPOFILLING Right 06/10/2019   Procedure: right breast scar and capsule release with fat grafting;  Surgeon: Peggye Form, DO;  Location: [facility];  Service: Plastics;  Laterality: Right;   POLYPECTOMY  01/05/2023   Procedure: POLYPECTOMY;  Surgeon: Toney Reil, MD;  Location: [facility] ENDOSCOPY;  Service: Gastroenterology;;   [facility] REMOVAL     PORTACATH PLACEMENT Right 01/17/2018   Procedure: INSERTION PORT-A-CATH- RIGHT;  Surgeon: Earline Mayotte, MD;  Location: [facility];  Service: General;  Laterality: Right;   ROBOTIC ADRENALECTOMY Right 12/28/2016   Procedure: ROBOTIC ADRENALECTOMY;  Surgeon: Vanna Scotland, MD;  Location: [facility];  Service: Urology;  Laterality: Right;   SKIN CANCER EXCISION     THROAT SURGERY  1998   throat cancer    TUBAL LIGATION     VENTRAL HERNIA REPAIR N/A 03/03/2017   Procedure: HERNIA REPAIR VENTRAL ADULT;  Surgeon: Ricarda Frame, MD;  Location: [facility];  Service: General;  Laterality: N/A;   VIDEO BRONCHOSCOPY WITH ENDOBRONCHIAL ULTRASOUND N/A 01/25/2023   Procedure: VIDEO BRONCHOSCOPY WITH ENDOBRONCHIAL ULTRASOUND;  Surgeon: Vida Rigger, MD;   Location: [facility];  Service: Thoracic;  Laterality: N/A;     Social History:   reports that she quit smoking about 27 years ago. Her smoking use included cigarettes. She started smoking about 57 years ago. She has a 45 pack-year smoking history. She has been exposed to tobacco smoke. She has never used smokeless tobacco. She reports that she does not currently use alcohol after a past usage of about 4.0 standard drinks of alcohol per week. She reports that she does not use drugs.   Family History:  Her family history includes Cancer in her paternal aunt, paternal grandmother, and sister; Cancer (age of onset: 34) in her sister; Diabetes in her mother; Heart attack in her mother. There is no history of Prostate cancer, Kidney cancer, Bladder Cancer, or Breast cancer.   Allergies Allergies  Allergen Reactions   Sulfa Antibiotics Other (See Comments)    Mouth sore and raw     Home Medications  Prior to Admission medications   Medication Sig Start Date End Date Taking? Authorizing Provider  alum & mag hydroxide-simeth (MAALOX PLUS) 400-400-40 MG/5ML suspension Take 15 mLs by mouth every 6 (six) hours as needed for indigestion. 05/23/22  Yes Jacky Kindle, FNP  busPIRone (BUSPAR) 5 MG tablet Take 1 tablet (5 mg total) by mouth 3 (three) times daily. 10/12/22  Yes Jacky Kindle, FNP  DULoxetine (CYMBALTA) 60 MG capsule Take 1 capsule (60 mg total) by mouth 2 (two) times daily. 10/12/22  Yes Merita Norton T, FNP  gabapentin (NEURONTIN) 300 MG capsule TAKE 1 CAPSULE BY MOUTH 3 TIMES DAILY 10/11/22  Yes Merita Norton T, FNP  montelukast (SINGULAIR) 10 MG tablet Take 1 tablet (10 mg total) by mouth at bedtime. 07/14/22  Yes Bacigalupo, Marzella Schlein, MD  omeprazole (PRILOSEC) 40 MG capsule TAKE 1 CAPSULE BY MOUTH DAILY 09/29/22  Yes Merita Norton T, FNP  oxyCODONE (ROXICODONE) 5 MG immediate release tablet Take 1 tablet (5 mg total) by mouth every 6 (six) hours as needed for severe pain or moderate pain. 01/06/23   Yes Kennieth Francois, PA  aspirin EC 81 MG tablet Take 1 tablet (81 mg total) by mouth daily. Swallow whole. 12/08/21   Furth, Cadence H, PA-C  buPROPion (WELLBUTRIN XL)  150 MG 24 hr tablet Take 3 tablets (450 mg total) by mouth daily. 01/16/23   Jacky Kindle, FNP  cyclobenzaprine (FLEXERIL) 5 MG tablet TAKE 1 TABLET BY MOUTH AT BEDTIME 12/14/22   Merita Norton T, FNP  rOPINIRole (REQUIP) 0.25 MG tablet TAKE 1 TABLET BY MOUTH AT BEDTIME 08/04/22   Jacky Kindle, FNP  rosuvastatin (CRESTOR) 40 MG tablet Take 1 tablet (40 mg total) by mouth daily. 12/15/21   Jacky Kindle, FNP     Critical care provider statement:   Total critical care time: 33 minutes   Performed by: Karna Christmas MD   Critical care time was exclusive of separately billable procedures and treating other patients.   Critical care was necessary to treat or prevent imminent or life-threatening deterioration.   Critical care was time spent personally by me on the following activities: development of treatment plan with patient and/or surrogate as well as nursing, discussions with consultants, evaluation of patient's response to treatment, examination of patient, obtaining history from patient or surrogate, ordering and performing treatments and interventions, ordering and review of laboratory studies, ordering and review of radiographic studies, pulse oximetry and re-evaluation of patient's condition.    Vida Rigger, M.D.  Pulmonary & Critical Care Medicine

## 2023-01-27 NOTE — Progress Notes (Signed)
  Echocardiogram 2D Echocardiogram has been performed.  Denise Watts 01/27/2023, 4:30 PM

## 2023-01-27 NOTE — TOC Progression Note (Addendum)
Transition of Care [facility]) - Progression Note    Patient Details  Name: Denise Watts MRN: [id number] Date of Birth: [birth year]  Transition of [facility]) CM/SW Contact  Allena Katz, LCSW Phone Number: 01/27/2023, 10:37 AM  Clinical Narrative:    Recs are now for rehab. Pt to get chest tube out today. TOC to discuss with patient.   2:49pm Pt having a hard time staying awake to work with PT. CSW will follow up when pt is more alert and able to discuss rehab.          Expected Discharge Plan and Services                                               Social Determinants of Health (SDOH) Interventions SDOH Screenings   Food Insecurity: No Food Insecurity (12/05/2022)  Housing: Low Risk  (11/21/2022)  Transportation Needs: No Transportation Needs (12/05/2022)  Utilities: Not At Risk (12/05/2022)  Alcohol Screen: Low Risk  (12/05/2022)  Depression (PHQ2-9): High Risk (12/28/2022)  Financial Resource Strain: Low Risk  (12/02/2021)  Physical Activity: Insufficiently Active (12/05/2022)  Social Connections: Moderately Isolated (12/05/2022)  Stress: Stress Concern Present (12/05/2022)  Tobacco Use: Medium Risk (01/25/2023)  Health Literacy: Adequate Health Literacy (12/05/2022)    Readmission Risk Interventions     No data to display

## 2023-01-27 NOTE — Progress Notes (Signed)
Patient states she is feeling lightheaded and dizzy with no relief, B/P 91/76 (82).  Dr. Karna Christmas notified and ordered bolus of LR, CBC, and CXR stat.

## 2023-01-27 NOTE — Plan of Care (Signed)
Continuing with plan of care. 

## 2023-01-27 NOTE — Progress Notes (Signed)
2D echo 01/27/2023  IMPRESSIONS    1. Left ventricular ejection fraction, by estimation, is 50 to 55%. The  left ventricle has low normal function. Left ventricular endocardial  border not optimally defined to evaluate regional wall motion. Left  ventricular diastolic parameters are  consistent with Grade I diastolic dysfunction (impaired relaxation).   2. Right ventricular systolic function is low normal. The right  ventricular size is not well visualized.   3. Right atrial size was mildly dilated.   4. The mitral valve is normal in structure. Mild mitral valve  regurgitation.   5. Tricuspid valve regurgitation is moderate.   6. The aortic valve was not well visualized. Aortic valve regurgitation  is not visualized.   Stable echo.  Per Dr. Serita Kyle note today no further cardiac workup needed this admit.  We will arrange outpt followup with Cardiology.

## 2023-01-27 NOTE — Progress Notes (Signed)
Per Dr. Karna Christmas, discontinue DVT prophylaxis and place SCD's, change chest tube from wall suction to water seal, and Norco 7.5mg  three times daily as needed.

## 2023-01-28 ENCOUNTER — Inpatient Hospital Stay: Payer: Medicare HMO

## 2023-01-28 LAB — RENAL FUNCTION PANEL
Albumin: 3.5 g/dL (ref 3.5–5.0)
Anion gap: 9 (ref 5–15)
BUN: 25 mg/dL — ABNORMAL HIGH (ref 8–23)
CO2: 26 mmol/L (ref 22–32)
Calcium: 8.5 mg/dL — ABNORMAL LOW (ref 8.9–10.3)
Chloride: 102 mmol/L (ref 98–111)
Creatinine, Ser: 0.72 mg/dL (ref 0.44–1.00)
GFR, Estimated: >60 mL/min (ref >60)
Glucose, Bld: 111 mg/dL — ABNORMAL HIGH (ref 70–99)
Phosphorus: 3.6 mg/dL (ref 2.5–4.6)
Potassium: 4.3 mmol/L (ref 3.5–5.1)
Sodium: 137 mmol/L (ref 135–145)

## 2023-01-28 LAB — CBC
HCT: 37.6 % (ref 36.0–46.0)
Hemoglobin: 11.8 g/dL — ABNORMAL LOW (ref 12.0–15.0)
MCH: 32 pg (ref 26.0–34.0)
MCHC: 31.4 g/dL (ref 30.0–36.0)
MCV: 101.9 fL — ABNORMAL HIGH (ref 80.0–100.0)
Platelets: 139 10*3/uL — ABNORMAL LOW (ref 150–400)
RBC: 3.69 MIL/uL — ABNORMAL LOW (ref 3.87–5.11)
RDW: 12.3 % (ref 11.5–15.5)
WBC: 7.5 10*3/uL (ref 4.0–10.5)
nRBC: 0 % (ref 0.0–0.2)

## 2023-01-28 MED ORDER — BENZONATATE 100 MG PO CAPS
200.0000 mg | ORAL_CAPSULE | Freq: Three times a day (TID) | ORAL | Status: DC
  Administered 2023-01-28: 200 mg via ORAL
  Filled 2023-01-28: qty 2

## 2023-01-28 MED ORDER — SODIUM CHLORIDE 0.9 % IV BOLUS
1000.0000 mL | Freq: Once | INTRAVENOUS | Status: AC
  Administered 2023-01-28: 1000 mL via INTRAVENOUS

## 2023-01-28 MED ORDER — LACTATED RINGERS IV BOLUS
500.0000 mL | Freq: Once | INTRAVENOUS | Status: AC
  Administered 2023-01-28: 500 mL via INTRAVENOUS

## 2023-01-28 MED ORDER — AMOXICILLIN-POT CLAVULANATE 875-125 MG PO TABS: 1.0000 | ORAL_TABLET | Freq: Two times a day (BID) | ORAL | 0 refills | Status: AC

## 2023-01-28 NOTE — Discharge Summary (Signed)
Physician Discharge Summary         Patient ID: Denise Watts MRN: [id number] DOB/AGE: 1955/05/23 67 y.o.  Admit date: 01/25/2023 Discharge date: 01/28/2023  Discharge Diagnoses:    Active Hospital Problems   Diagnosis Date Noted   Pneumothorax on left 01/25/2023   Elevated troponin 01/26/2023   Typical atrial flutter (HCC) 01/26/2023   PAF (paroxysmal atrial fibrillation) (HCC) 01/26/2023   Sinus tachycardia 01/26/2023   Acute respiratory failure with hypoxia [facility]) 01/25/2023    Resolved Hospital Problems  No resolved problems to display.      Discharge summary     67 y.o female with PMHx significant for COPD, vocal cord and breast cancer who came in for outpatient elective Bronchoscopy for evaluation of lung nodules.  Procedure complicated by development of acute left sided Pneumothorax with acute hypoxic respiratory failure requiring intubation, mechanical ventilation, and chest tube placement.     Aryel Petrelli is a 67 year old female with a past medical history significant for COPD, chronic cough, CAD, hiatal hernia, vocal cord cancer, breast cancer, anxiety disorder, major depressive disorder who presented to [facility] on 01/25/2023 for elective bronchoscopy for evaluation of lung nodules.   01/27/23- patient seen and evaluated at bedside.  She is improved, intermittent chronic cough causing pain at chest tube site, we have ordered PO norco for adequate analgesia.  CXR with resolution of left pneumothorax.  Chest tube management with no air leak.  Plan to advance to water seal with repeat imaging and removal of pigtail.    01/28/23- patient no events overnight, cxr with resolution of pneumothorax with clamped chest tube overnight.  S/p chest tube removal patient reports feeling better.  Blood work is essentially normal.  Pt had mild hypotension while here but explains she generally has low bp due to petite size. She has PT/OT pending today and may be dcd home with close outpatient  follow up if no barriers to discharge are noted.       Discharge Plan by Active Problems    Left pneumothroax Lung nodules  Vocal cord cancer Breast cancer Atrial fibrillation   Significant Hospital tests/ studies   Transthoracic echo  Procedures   Chest tube   Culture data/antimicrobials      Consults    Cardilology  Pulmonary   Discharge Exam: BP 92/67   Pulse 81   Temp 98.9 F (37.2 C) (Oral)   Resp 15   Ht 5\' 1"  (1.549 m)   Wt 63 kg   SpO2 98%   BMI 26.24 kg/m   Labs at discharge   Lab Results  Component Value Date   CREATININE 0.72 01/28/2023   BUN 25 (H) 01/28/2023   NA 137 01/28/2023   K 4.3 01/28/2023   CL 102 01/28/2023   CO2 26 01/28/2023   Lab Results  Component Value Date   WBC 7.5 01/28/2023   HGB 11.8 (L) 01/28/2023   HCT 37.6 01/28/2023   MCV 101.9 (H) 01/28/2023   PLT 139 (L) 01/28/2023   Lab Results  Component Value Date   ALT 16 12/06/2022   AST 24 12/06/2022   ALKPHOS 63 12/06/2022   BILITOT 0.5 12/06/2022   Lab Results  Component Value Date   INR 1.0 01/23/2023   INR 1.0 01/06/2023    Current radiological studies    DG Chest Port 1 View  Result Date: 01/28/2023 CLINICAL DATA:  Follow-up pneumothorax.  Left chest tube in place. EXAM: PORTABLE CHEST 1 VIEW COMPARISON:  01/27/2023 FINDINGS: Stable  position of the left basilar chest tube. Small left apical pleural line is identified scratch set small left apical pneumothorax is again noted and appears unchanged compared with the previous exam. No pleural fluid, interstitial edema or airspace disease. Left midlung nodular opacity appears unchanged in the interval. IMPRESSION: 1. Stable position of left basilar chest tube. 2. Stable small left apical pneumothorax. 3. Unchanged left midlung nodular opacity. Electronically Signed   By: Signa Kell M.D.   On: 01/28/2023 08:51   DG Chest Port 1 View  Result Date: 01/28/2023 CLINICAL DATA:  Chest tube removal EXAM: PORTABLE  CHEST 1 VIEW COMPARISON:  01/20/2023 FINDINGS: Interval movable of LEFT chest tube. Trace LEFT apical pneumothorax measures 2-3 mm from the apical chest wall LEFT mid lung nodules again noted. IMPRESSION: Trace LEFT apical pneumothorax following chest tube removal. LEFT midlung nodules. These results will be called to the ordering clinician or representative by the Radiologist Assistant, and communication documented in the PACS or [employer]. Electronically Signed   By: Genevive Bi M.D.   On: 01/28/2023 08:42   ECHOCARDIOGRAM COMPLETE  Result Date: 01/27/2023    ECHOCARDIOGRAM REPORT   Patient Name:   Denise Watts Date of Exam: 01/27/2023 Medical Rec #:  [id number]    Height:       61.0 in Accession #:    [id number]   Weight:       137.1 lb Date of Birth:  [birth year]    BSA:          1.609 m Patient Age:    67 years     BP:           99/71 mmHg Patient Gender: F            HR:           82 bpm. Exam Location:  [facility] Procedure: 2D Echo, Cardiac Doppler and Color Doppler Indications:     Atrial Fibrillation I48.91  History:         Patient has no prior history of Echocardiogram examinations.                  CAD; Signs/Symptoms:Dyspnea.  Sonographer:     Neysa Bonito Roar Referring Phys:  [id number] Judithe Modest Diagnosing Phys: Debbe Odea MD  Sonographer Comments: Technically difficult study due to poor echo windows and Technically challenging study due to limited acoustic windows. Image acquisition challenging due to respiratory motion. IMPRESSIONS  1. Left ventricular ejection fraction, by estimation, is 50 to 55%. The left ventricle has low normal function. Left ventricular endocardial border not optimally defined to evaluate regional wall motion. Left ventricular diastolic parameters are consistent with Grade I diastolic dysfunction (impaired relaxation).  2. Right ventricular systolic function is low normal. The right ventricular size is not well visualized.  3. Right atrial size was mildly  dilated.  4. The mitral valve is normal in structure. Mild mitral valve regurgitation.  5. Tricuspid valve regurgitation is moderate.  6. The aortic valve was not well visualized. Aortic valve regurgitation is not visualized. FINDINGS  Left Ventricle: Left ventricular ejection fraction, by estimation, is 50 to 55%. The left ventricle has low normal function. Left ventricular endocardial border not optimally defined to evaluate regional wall motion. The left ventricular internal cavity  size was normal in size. There is no left ventricular hypertrophy. Left ventricular diastolic parameters are consistent with Grade I diastolic dysfunction (impaired relaxation). Right Ventricle: The right ventricular size is not well visualized. No increase  in right ventricular wall thickness. Right ventricular systolic function is low normal. Left Atrium: Left atrial size was normal in size. Right Atrium: Right atrial size was mildly dilated. Pericardium: There is no evidence of pericardial effusion. Mitral Valve: The mitral valve is normal in structure. Mild mitral valve regurgitation. MV peak gradient, 2.4 mmHg. The mean mitral valve gradient is 1.0 mmHg. Tricuspid Valve: The tricuspid valve is normal in structure. Tricuspid valve regurgitation is moderate. Aortic Valve: The aortic valve was not well visualized. Aortic valve regurgitation is not visualized. Pulmonic Valve: The pulmonic valve was not well visualized. Pulmonic valve regurgitation is not visualized. Aorta: The aortic root is normal in size and structure. IAS/Shunts: No atrial level shunt detected by color flow Doppler.  LEFT VENTRICLE PLAX 2D LVIDd:         3.40 cm   Diastology LVIDs:         2.60 cm   LV e' medial:    8.16 cm/s LV PW:         0.70 cm   LV E/e' medial:  7.6 LV IVS:        1.00 cm   LV e' lateral:   10.10 cm/s LVOT diam:     1.60 cm   LV E/e' lateral: 6.1 LVOT Area:     2.01 cm  RIGHT VENTRICLE RV Basal diam:  3.60 cm RV Mid diam:    3.00 cm LEFT  ATRIUM           Index        RIGHT ATRIUM           Index LA diam:      3.20 cm 1.99 cm/m   RA Area:     15.80 cm LA Vol (A4C): 31.5 ml 19.58 ml/m  RA Volume:   38.20 ml  23.74 ml/m   AORTA Ao Root diam: 1.80 cm MITRAL VALVE               TRICUSPID VALVE MV Area (PHT): 4.49 cm    TR Peak grad:   26.4 mmHg MV Peak grad:  2.4 mmHg    TR Vmax:        257.00 cm/s MV Mean grad:  1.0 mmHg MV Vmax:       0.78 m/s    SHUNTS MV Vmean:      51.1 cm/s   Systemic Diam: 1.60 cm MV Decel Time: 169 msec MV E velocity: 61.80 cm/s MV A velocity: 74.40 cm/s MV E/A ratio:  0.83 MV A Prime:    12.0 cm/s Debbe Odea MD Electronically signed by Debbe Odea MD Signature Date/Time: 01/27/2023/5:08:46 PM    Final    DG Chest Port 1 View  Result Date: 01/27/2023 CLINICAL DATA:  Left-sided chest pain EXAM: PORTABLE CHEST 1 VIEW COMPARISON:  Earlier same day FINDINGS: Cardiopericardial silhouette is at upper limits of normal for size. Left chest tube remains in place with continued further decrease in size of the apical left pneumothorax. Right lung clear. Nodular density left lower lung again noted. Telemetry leads overlie the chest. IMPRESSION: Continued further decrease in size of the apical left pneumothorax. Electronically Signed   By: Kennith Center M.D.   On: 01/27/2023 15:13   DG Chest Port 1 View  Result Date: 01/27/2023 CLINICAL DATA:  Follow-up pneumothorax. EXAM: PORTABLE CHEST 1 VIEW COMPARISON:  Chest radiograph 01/26/2023 FINDINGS: The cardiac silhouette is mildly enlarged. A left-sided chest tube remains in place, terminating over the lateral left lung  base. A small to moderate-sized left apical pneumothorax has mildly decreased in size. Lung volumes are lower than on the prior study with increased patchy opacities in the left greater than right lung bases. Left lower lung nodules are again noted. No sizable pleural effusion is identified. IMPRESSION: 1. Mildly decreased size of left apical pneumothorax.  2. Decreased lung volumes with increased bibasilar atelectasis or infiltrates. Electronically Signed   By: Sebastian Ache M.D.   On: 01/27/2023 08:53   DG Chest Port 1 View  Result Date: 01/26/2023 CLINICAL DATA:  Chest tube EXAM: PORTABLE CHEST 1 VIEW COMPARISON:  01/26/2023, CT 01/23/2023 FINDINGS: Removal of endotracheal and esophageal tubes. Left lower chest drainage catheter with pigtail at the CP angle. Left lower lung nodule. Interval increase in size of left apical pneumothorax, now demonstrating 3.4 cm of pleural-parenchymal separation IMPRESSION: Interval increase in size of left apical pneumothorax now demonstrating up to 3.4 cm pleural-parenchymal separation at the apex. Left lower chest drainage catheter remains in place. These results will be called to the ordering clinician or representative by the Radiologist Assistant, and communication documented in the PACS or [employer]. Electronically Signed   By: Jasmine Pang M.D.   On: 01/26/2023 19:05    Disposition:    Discharge disposition: 01-Home or Self Care         Allergies as of 01/28/2023       Reactions   Sulfa Antibiotics Other (See Comments)   Mouth sore and raw        Medication List     STOP taking these medications    alum & mag hydroxide-simeth 400-400-40 MG/5ML suspension Commonly known as: MAALOX PLUS   gabapentin 300 MG capsule Commonly known as: NEURONTIN   omeprazole 40 MG capsule Commonly known as: PRILOSEC       TAKE these medications    amoxicillin-clavulanate 875-125 MG tablet Commonly known as: AUGMENTIN Take 1 tablet by mouth every 12 (twelve) hours for 4 days.   aspirin EC 81 MG tablet Take 1 tablet (81 mg total) by mouth daily. Swallow whole.   buPROPion 150 MG 24 hr tablet Commonly known as: Wellbutrin XL Take 3 tablets (450 mg total) by mouth daily.   busPIRone 5 MG tablet Commonly known as: BUSPAR Take 1 tablet (5 mg total) by mouth 3 (three) times daily.    cyclobenzaprine 5 MG tablet Commonly known as: FLEXERIL TAKE 1 TABLET BY MOUTH AT BEDTIME   DULoxetine 60 MG capsule Commonly known as: CYMBALTA Take 1 capsule (60 mg total) by mouth 2 (two) times daily.   montelukast 10 MG tablet Commonly known as: SINGULAIR Take 1 tablet (10 mg total) by mouth at bedtime.   oxyCODONE 5 MG immediate release tablet Commonly known as: Roxicodone Take 1 tablet (5 mg total) by mouth every 6 (six) hours as needed for severe pain or moderate pain.   rOPINIRole 0.25 MG tablet Commonly known as: REQUIP TAKE 1 TABLET BY MOUTH AT BEDTIME   rosuvastatin 40 MG tablet Commonly known as: CRESTOR Take 1 tablet (40 mg total) by mouth daily.         Follow-up appointment   Dr Karna Christmas - 1wk Discharge Condition:    stable  Physician Statement:   The Patient was personally examined, the discharge assessment and plan has been personally reviewed and I agree with ACNP Babcock's assessment and plan. 35 minutes of time have been dedicated to discharge assessment, planning and discharge instructions.   Signed: Vida Rigger 01/28/2023, 9:12  AM

## 2023-01-28 NOTE — Progress Notes (Signed)
Physical Therapy Treatment Patient Details Name: Denise Watts MRN: [id number] DOB: 02-23-1956 Today's Date: 01/28/2023   History of Present Illness Pt is a 67 year old female with a history of anxiety disorder, right knee osteoarthritis, COPD, coronary artery disease, chronic cough, major depressive disorder, chronic dyspnea, GERD, history of hiatal hernia, history of vocal cord cancer and breast cancer status post medical oncology evaluation with full scope of therapy including radiation.    PT Comments  Pt was long sitting in bed upon arrival. She is much more alert and oriented this date versus previous date. She agrees to PT session and remains cooperative throughout. Pt BP remains soft but MAP > 65 throughout session. She was able to tolerate getting OOB and taking a few steps to recliner. Distance limited by pt's c/o foot pain. Overall she tolerated session well. She will benefit from continued skilled PT at DC to maximize her independence and safety with all ADLs.    If plan is discharge home, recommend the following: A little help with walking and/or transfers;A little help with bathing/dressing/bathroom;Assist for transportation;Help with stairs or ramp for entrance     Equipment Recommendations  Rolling walker (2 wheels)       Precautions / Restrictions Precautions Precautions: Fall Precaution Comments: L chest tube Restrictions Weight Bearing Restrictions: No     Mobility  Bed Mobility Overal bed mobility: Needs Assistance Bed Mobility: Supine to Sit  Supine to sit: Min assist  Transfers Overall transfer level: Needs assistance Equipment used: Rolling walker (2 wheels) Transfers: Sit to/from Stand Sit to Stand: Min assist  General transfer comment: Min assist to stand from EOB    Ambulation/Gait Ambulation/Gait assistance: Min assist Gait Distance (Feet): 4 Feet Assistive device: Rolling walker (2 wheels) Gait Pattern/deviations: Step-to pattern Gait velocity:  decreased  General Gait Details: Pt was able to take steps away from EOB to recliner with min assist. distance limited by fatigue and c/o BLE foot pain. Vitals were stable but pt was hypotensive throughout.   Balance Overall balance assessment: Needs assistance Sitting-balance support: Bilateral upper extremity supported, Feet supported Sitting balance-Leahy Scale: Good     Standing balance support: Bilateral upper extremity supported, During functional activity, Reliant on assistive device for balance Standing balance-Leahy Scale: Fair Standing balance comment: reliant on AD for dynamic standing balance       Cognition Arousal: Alert Behavior During Therapy: WFL for tasks assessed/performed Overall Cognitive Status: Within Functional Limits for tasks assessed      General Comments: AO x4; pleasant and cooperative during therapy               Pertinent Vitals/Pain Pain Assessment Pain Assessment: 0-10 Pain Score: 4  Pain Intervention(s): Limited activity within patient's tolerance, Monitored during session, Premedicated before session, Repositioned     PT Goals (current goals can now be found in the care plan section) Acute Rehab PT Goals Patient Stated Goal: to go home Progress towards PT goals: Progressing toward goals    Frequency    Min 1X/week       AM-PAC PT "6 Clicks" Mobility   Outcome Measure  Help needed turning from your back to your side while in a flat bed without using bedrails?: A Little Help needed moving from lying on your back to sitting on the side of a flat bed without using bedrails?: A Little Help needed moving to and from a bed to a chair (including a wheelchair)?: A Little Help needed standing up from a chair using your arms (  e.g., wheelchair or bedside chair)?: A Little Help needed to walk in hospital room?: A Lot Help needed climbing 3-5 steps with a railing? : A Lot 6 Click Score: 16    End of Session   Activity Tolerance: Patient  tolerated treatment well;Patient limited by fatigue;Patient limited by pain (limited by fatigue and BLE foot pain) Patient left: in chair;with call bell/phone within reach;with nursing/sitter in room Nurse Communication: Mobility status PT Visit Diagnosis: Unsteadiness on feet (R26.81);Muscle weakness (generalized) (M62.81);History of falling (Z91.81);Pain     Time: 1324-4010 PT Time Calculation (min) (ACUTE ONLY): 17 min  Charges:    $Therapeutic Activity: 8-22 mins PT General Charges $$ ACUTE PT VISIT: 1 Visit                    Jetta Lout PTA 01/28/23, 9:32 AM

## 2023-01-28 NOTE — Plan of Care (Signed)
Continuing with plan of care. 

## 2023-01-28 NOTE — Progress Notes (Signed)
Discharge teaching completed with patient who verbalized understanding of teaching, patient is in stable condition.

## 2023-01-28 NOTE — Progress Notes (Signed)
Patient with low urine output and B/P 90/56 (66), Dr. Karna Christmas notified and ordered LR bolus.

## 2023-01-28 NOTE — Progress Notes (Signed)
Dr. Karna Christmas removed chest tube earlier in the shift, patient tolerated well.

## 2023-01-28 NOTE — TOC Progression Note (Signed)
Transition of Care [facility]) - Progression Note    Patient Details  Name: Denise Watts MRN: [id number] Date of Birth: [birth year]  Transition of Care [facility]) CM/SW Contact  Colette Ribas, [place] Phone Number: 01/28/2023, 12:55 PM  Clinical Narrative:      CSW spoke with MD patient was cleared by PT to go home with spousal support and help.      Expected Discharge Plan and Services         Expected Discharge Date: 01/28/23                                     Social Determinants of Health (SDOH) Interventions SDOH Screenings   Food Insecurity: No Food Insecurity (12/05/2022)  Housing: Low Risk  (11/21/2022)  Transportation Needs: No Transportation Needs (12/05/2022)  Utilities: Not At Risk (12/05/2022)  Alcohol Screen: Low Risk  (12/05/2022)  Depression (PHQ2-9): High Risk (12/28/2022)  Financial Resource Strain: Low Risk  (12/02/2021)  Physical Activity: Insufficiently Active (12/05/2022)  Social Connections: Moderately Isolated (12/05/2022)  Stress: Stress Concern Present (12/05/2022)  Tobacco Use: Medium Risk (01/25/2023)  Health Literacy: Adequate Health Literacy (12/05/2022)    Readmission Risk Interventions     No data to display

## 2023-01-28 NOTE — Progress Notes (Signed)
NAME:  Denise Watts, MRN:  [id number], DOB:  1956/01/05, LOS: 3 ADMISSION DATE:  01/25/2023, CONSULTATION DATE:  01/25/2023 REFERRING MD:  Dr. Karna Christmas, CHIEF COMPLAINT:  Acute Hypoxic Respiratory Failure, Pneumothorax   Brief Pt Description / Synopsis:  67 y.o female with PMHx significant for COPD, vocal cord and breast cancer who underwent elective Bronchoscopy for evaluation of lung nodules.  Procedure complicated by development of acute large left sided Pneumothorax with acute hypoxic respiratory failure requiring intubation, mechanical ventilation, and chest tube placement.  History of Present Illness:  Denise Watts is a 67 year old female with a past medical history significant for COPD, chronic cough, CAD, hiatal hernia, vocal cord cancer, breast cancer, anxiety disorder, major depressive disorder who presented to [facility] on 01/25/2023 for elective bronchoscopy for evaluation of lung nodules.  01/27/23- patient seen and evaluated at bedside.  She is improved, intermittent chronic cough causing pain at chest tube site, we have ordered PO norco for adequate analgesia.  CXR with resolution of left pneumothorax.  Chest tube management with no air leak.  Plan to advance to water seal with repeat imaging and removal of pigtail.   01/28/23- patient no events overnight, cxr with resolution of pneumothorax with clamped chest tube overnight.  S/p chest tube removal patient reports feeling better.  Blood work is essentially normal.  Pt had mild hypotension while here but explains she generally has low bp due to petite size. She has PT/OT pending today and may be dcd home with close outpatient follow up if no barriers to discharge are noted.   Pertinent  Medical History   Past Medical History:  Diagnosis Date   Anxiety    Arthritis    KNEE RIGHT   Breast cancer (HCC) 1997   right breast lumpectomy with radiation   Breast cancer (HCC) 11/15/2017   INVASIVE MAMMARY CARCINOMA/ triple negative   Cancer of  vocal cord (HCC) 11/10/2014   COPD (chronic obstructive pulmonary disease) (HCC)    Coronary artery disease    Cough 03/02/2017   INTERMTTENT DRY COUGH   Depression    Dyspnea    GERD (gastroesophageal reflux disease)    History of hiatal hernia    Personal history of chemotherapy    Personal history of radiation therapy    Skin cancer    Throat cancer (HCC) 1998   radiation   Wears dentures    full upper and lower     Micro Data:  N/A  Antimicrobials:   Anti-infectives (From admission, onward)    Start     Dose/Rate Route Frequency Ordered Stop   01/27/23 0900  amoxicillin-clavulanate (AUGMENTIN) 875-125 MG per tablet 1 tablet        1 tablet Oral Every 12 hours 01/27/23 0810 02/01/23 0959       Significant Hospital Events: Including procedures, antibiotic start and stop dates in addition to other pertinent events   9/25: Presented for elective Bronchoscopy and lung biopsy.  Procedure complicated by large left sided PTX and acute hypoxic respiratory failure requiring intubation in PACU.  Dr. Karna Christmas placing emergent chest tube.  Transfer to ICU, PCCM consulted. 9/26: Resolution of PTX on X-ray.  EXTUBATED. New onset A.fib during SBT/extubation, Obtain Echocardiogram and consult Cardiology.  Following extubation will perform clamp trial of chest tube to evaluate for possible removal of chest tube.    Objective   Blood pressure 92/67, pulse 81, temperature 98.9 F (37.2 C), temperature source Oral, resp. rate 15, height 5\' 1"  (1.549 m), weight  63 kg, SpO2 98%.        Intake/Output Summary (Last 24 hours) at 01/28/2023 0805 Last data filed at 01/28/2023 0500 Gross per 24 hour  Intake 846.32 ml  Output 670 ml  Net 176.32 ml   Filed Weights   01/26/23 0415 01/27/23 0305 01/28/23 0130  Weight: 62.3 kg 62.2 kg 63 kg    Examination: General: Acute on chronically ill-appearing female, sitting up in bed eating breakfast HENT: Atraumatic, normocephalic, neck supple,  no JVD Lungs: Coarse throughout, even, nonlabored, normal effort, overbreathing the vent Cardiovascular: Tachycardia, irregularly irregular rhythm, no murmurs, rubs, gallops Abdomen: Soft, nontender, nondistended, no guarding rebound tenderness, bowel sounds positive x 4 Extremities: Normal bulk and tone, no deformities, no edema Neuro: Off sedation, following commands no focal deficits noted nodding to questions, pupils PERRLA GU: External female catheter in place  Resolved Hospital Problem list     Assessment & Plan:   #Acute Hypoxic Respiratory Failure  #Acute Pneumothorax following Bronchoscopy with lung biopsy #COPD without acute exacerbation PMHx: COPD, Multiple lung nodules with hx of vocal cord and breast cancer  -Follow intermittent Chest X-ray & ABG as needed -Spontaneous Breathing Trials when respiratory parameters met and mental status permits -Implement VAP Bundle -Bronchodilators & Pulmicort nebs -Continue Chest tube to suction ~ clamp tube and obtain CXR later in afternoon to evaluate for possible removal of tube -Follow up Cytology results  #New onset Atrial Fibrillation #Hypertension PMHx: CAD -Continuous cardiac monitoring -Maintain MAP >65 -Vasopressors as needed to maintain MAP goal ~ not requiring -HS Troponin peaked at 73 -Check Thyroid panel -Echocardiogram pending -Consult Cardiology, appreciate input  #Anxiety  #Depression -Maintain a RASS goal of 0 to -1 -Fentanyl and Propofol as needed to maintain RASS goal -Avoid sedating medications as able -Daily wake up assessment -Once able to tolerate PO will resume Wellbutrin, Buspar, Flexeril, Cymbalta, Gabapentin, and Requip     Best Practice (right click and "Reselect all SmartList Selections" daily)   Diet/type: NPO pending bedside swallow evaluation DVT prophylaxis: Heparin SQ GI prophylaxis: PPI Lines: N/A Foley:  N/A Code Status:  full code Last date of multidisciplinary goals of care  discussion [9/26]  9/26: Pt and her daughter updated at bedside.  Labs   CBC: Recent Labs  Lab 01/25/23 1931 01/26/23 0511 01/27/23 0503 01/27/23 1331 01/28/23 0407  WBC 9.4 8.0 9.4 9.3 7.5  HGB 13.3 12.9 12.6 12.4 11.8*  HCT 39.7 40.1 39.2 39.2 37.6  MCV 96.1 98.8 100.8* 101.0* 101.9*  PLT 178 165 156 150 139*    Basic Metabolic Panel: Recent Labs  Lab 01/25/23 1639 01/26/23 0511 01/27/23 0503 01/28/23 0407  NA 139 136 141 137  K 4.8 4.9 4.8 4.3  CL 106 106 103 102  CO2 20* 22 30 26   GLUCOSE 146* 167* 114* 111*  BUN 17 26* 33* 25*  CREATININE 0.73  0.71 0.96 0.88 0.72  CALCIUM 8.7* 8.7* 8.9 8.5*  MG  --  2.3 2.3  --   PHOS 4.2 4.2 3.4 3.6   GFR: Estimated Creatinine Clearance: 56.5 mL/min (by C-G formula based on SCr of 0.72 mg/dL). Recent Labs  Lab 01/26/23 0511 01/27/23 0503 01/27/23 1331 01/28/23 0407  WBC 8.0 9.4 9.3 7.5    Liver Function Tests: Recent Labs  Lab 01/25/23 1639 01/26/23 0511 01/27/23 0503 01/28/23 0407  ALBUMIN 3.7 3.4* 3.5 3.5   No results for input(s): "LIPASE", "AMYLASE" in the last 168 hours. No results for input(s): "AMMONIA" in the last 168  hours.  ABG    Component Value Date/Time   PHART 7.44 01/25/2023 2103   PCO2ART 41 01/25/2023 2103   PO2ART 89 01/25/2023 2103   HCO3 27.8 01/25/2023 2103   ACIDBASEDEF 2.4 (H) 01/25/2023 1610   O2SAT 99.2 01/25/2023 2103     Coagulation Profile: Recent Labs  Lab 01/23/23 1317  INR 1.0    Cardiac Enzymes: No results for input(s): "CKTOTAL", "CKMB", "CKMBINDEX", "TROPONINI" in the last 168 hours.  HbA1C: Hgb A1c MFr Bld  Date/Time Value Ref Range Status  11/03/2021 03:51 PM 5.7 (H) 4.8 - 5.6 % Final    Comment:             Prediabetes: 5.7 - 6.4          Diabetes: >6.4          Glycemic control for adults with diabetes: <7.0   05/26/2021 11:01 AM 5.6 4.8 - 5.6 % Final    Comment:             Prediabetes: 5.7 - 6.4          Diabetes: >6.4          Glycemic  control for adults with diabetes: <7.0     CBG: Recent Labs  Lab 01/25/23 2319 01/26/23 0321 01/26/23 0737 01/26/23 1119 01/26/23 1920  GLUCAP 198* 161* 125* 121* 210*    Review of Systems:   Unable to assess due to intubation/sedation/AMS   Past Medical History:  She,  has a past medical history of Anxiety, Arthritis, Breast cancer (HCC) (1997), Breast cancer (HCC) (11/15/2017), 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HCC) (11/10/2014), COPD (chronic obstructive pulmonary disease) (HCC), Coronary artery disease, Cough (03/02/2017), Depression, Dyspnea, GERD (gastroesophageal reflux disease), History of hiatal hernia, Personal history of chemotherapy, Personal history of radiation therapy, Skin cancer, Throat cancer (HCC) (1998), and Wears dentures.   Surgical History:   Past Surgical History:  Procedure Laterality Date   BIOPSY  01/05/2023   Procedure: BIOPSY;  Surgeon: Toney Reil, MD;  Location[facility] ENDOSCOPY;  Service: Gastroenterology;;   BREAST BIOPSY Right 1997   positive   BREAST BIOPSY Right 11/15/2017   INVASIVE MAMMARY CARCINOMA triple negative   BREAST BIOPSY Right 07/20/2020   [place] bx, Q marker, neg   BREAST LUMPECTOMY Right 1997   2019 also   BREAST LUMPECTOMY WITH SENTINEL LYMPH NODE BIOPSY Right 12/08/2017   Procedure: BREAST LUMPECTOMY WITH SENTINEL LYMPH NODE BX;  Surgeon: Earline Mayotte, MD;  Location: [facility] ORS;  Service: General;  Laterality: Right;   BREAST REDUCTION WITH MASTOPEXY Left 06/10/2019   Procedure: Left breast mastopexy reduction for symmetry;  Surgeon: Peggye Form, DO;  Location: [facility];  Service: Plastics;  Laterality: Left;  total 2.5 hours   CATARACT EXTRACTION W/PHACO Right 02/07/2022   Procedure: CATARACT EXTRACTION PHACO AND INTRAOCULAR LENS PLACEMENT (IOC) RIGHT DIABETIC;  Surgeon: Nevada Crane, MD;  Location: [facility];  Service: Ophthalmology;  Laterality: Right;  3.60 00:34.7   CATARACT EXTRACTION  W/PHACO Left 02/28/2022   Procedure: CATARACT EXTRACTION PHACO AND INTRAOCULAR LENS PLACEMENT (IOC) LEFT DIABETIC 4.11 00:26.1;  Surgeon: Nevada Crane, MD;  Location: [facility];  Service: Ophthalmology;  Laterality: Left;   COLONOSCOPY  2015   COLONOSCOPY N/A 01/05/2023   Procedure: COLONOSCOPY;  Surgeon: Toney Reil, MD;  Location: [facility] ENDOSCOPY;  Service: Gastroenterology;  Laterality: N/A;   COLONOSCOPY WITH PROPOFOL N/A 10/29/2019   Procedure: COLONOSCOPY WITH PROPOFOL;  Surgeon: Midge Minium,  MD;  Location: [facility] ENDOSCOPY;  Service: Endoscopy;  Laterality: N/A;   ESOPHAGOGASTRODUODENOSCOPY (EGD) WITH PROPOFOL N/A 05/24/2019   Procedure: ESOPHAGOGASTRODUODENOSCOPY (EGD) WITH PROPOFOL;  Surgeon: Midge Minium, MD;  Location: [facility] ENDOSCOPY;  Service: Endoscopy;  Laterality: N/A;   ESOPHAGOGASTRODUODENOSCOPY (EGD) WITH PROPOFOL N/A 09/06/2022   Procedure: ESOPHAGOGASTRODUODENOSCOPY (EGD) WITH PROPOFOL;  Surgeon: Midge Minium, MD;  Location: [facility] ENDOSCOPY;  Service: Endoscopy;  Laterality: N/A;   ESOPHAGOGASTRODUODENOSCOPY (EGD) WITH PROPOFOL  01/05/2023   Procedure: ESOPHAGOGASTRODUODENOSCOPY (EGD) WITH PROPOFOL;  Surgeon: Toney Reil, MD;  Location: [facility] ENDOSCOPY;  Service: Gastroenterology;;   ESOPHAGUS SURGERY     KNEE SURGERY Right    LIPOSUCTION WITH LIPOFILLING Right 06/10/2019   Procedure: right breast scar and capsule release with fat grafting;  Surgeon: Peggye Form, DO;  Location: [facility];  Service: Plastics;  Laterality: Right;   POLYPECTOMY  01/05/2023   Procedure: POLYPECTOMY;  Surgeon: Toney Reil, MD;  Location: [facility] ENDOSCOPY;  Service: Gastroenterology;;   [facility] REMOVAL     PORTACATH PLACEMENT Right 01/17/2018   Procedure: INSERTION PORT-A-CATH- RIGHT;  Surgeon: Earline Mayotte, MD;  Location: [facility]S;  Service: General;  Laterality: Right;   ROBOTIC ADRENALECTOMY Right 12/28/2016   Procedure: ROBOTIC  ADRENALECTOMY;  Surgeon: Vanna Scotland, MD;  Location: [facility] ORS;  Service: Urology;  Laterality: Right;   SKIN CANCER EXCISION     THROAT SURGERY  1998   throat cancer    TUBAL LIGATION     VENTRAL HERNIA REPAIR N/A 03/03/2017   Procedure: HERNIA REPAIR VENTRAL ADULT;  Surgeon: Ricarda Frame, MD;  Location: [facility] ORS;  Service: General;  Laterality: N/A;   VIDEO BRONCHOSCOPY WITH ENDOBRONCHIAL ULTRASOUND N/A 01/25/2023   Procedure: VIDEO BRONCHOSCOPY WITH ENDOBRONCHIAL ULTRASOUND;  Surgeon: Vida Rigger, MD;  Location: [facility] ORS;  Service: Thoracic;  Laterality: N/A;     Social History:   reports that she quit smoking about 27 years ago. Her smoking use included cigarettes. She started smoking about 57 years ago. She has a 45 pack-year smoking history. She has been exposed to tobacco smoke. She has never used smokeless tobacco. She reports that she does not currently use alcohol after a past usage of about 4.0 standard drinks of alcohol per week. She reports that she does not use drugs.   Family History:  Her family history includes Cancer in her paternal aunt, paternal grandmother, and sister; Cancer (age of onset: 14) in her sister; Diabetes in her mother; Heart attack in her mother. There is no history of Prostate cancer, Kidney cancer, Bladder Cancer, or Breast cancer.   Allergies Allergies  Allergen Reactions   Sulfa Antibiotics Other (See Comments)    Mouth sore and raw     Home Medications  Prior to Admission medications   Medication Sig Start Date End Date Taking? Authorizing Provider  alum & mag hydroxide-simeth (MAALOX PLUS) 400-400-40 MG/5ML suspension Take 15 mLs by mouth every 6 (six) hours as needed for indigestion. 05/23/22  Yes Jacky Kindle, FNP  busPIRone (BUSPAR) 5 MG tablet Take 1 tablet (5 mg total) by mouth 3 (three) times daily. 10/12/22  Yes Jacky Kindle, FNP  DULoxetine (CYMBALTA) 60 MG capsule Take 1 capsule (60 mg total) by mouth 2 (two) times daily.  10/12/22  Yes Merita Norton T, FNP  gabapentin (NEURONTIN) 300 MG capsule TAKE 1 CAPSULE BY MOUTH 3 TIMES DAILY 10/11/22  Yes Merita Norton T, FNP  montelukast (SINGULAIR) 10 MG tablet Take 1 tablet (10 mg  total) by mouth at bedtime. 07/14/22  Yes Bacigalupo, Marzella Schlein, MD  omeprazole (PRILOSEC) 40 MG capsule TAKE 1 CAPSULE BY MOUTH DAILY 09/29/22  Yes Merita Norton T, FNP  oxyCODONE (ROXICODONE) 5 MG immediate release tablet Take 1 tablet (5 mg total) by mouth every 6 (six) hours as needed for severe pain or moderate pain. 01/06/23  Yes Kennieth Francois, PA  aspirin EC 81 MG tablet Take 1 tablet (81 mg total) by mouth daily. Swallow whole. 12/08/21   Furth, Cadence H, PA-C  buPROPion (WELLBUTRIN XL) 150 MG 24 hr tablet Take 3 tablets (450 mg total) by mouth daily. 01/16/23   Jacky Kindle, FNP  cyclobenzaprine (FLEXERIL) 5 MG tablet TAKE 1 TABLET BY MOUTH AT BEDTIME 12/14/22   Merita Norton T, FNP  rOPINIRole (REQUIP) 0.25 MG tablet TAKE 1 TABLET BY MOUTH AT BEDTIME 08/04/22   Jacky Kindle, FNP  rosuvastatin (CRESTOR) 40 MG tablet Take 1 tablet (40 mg total) by mouth daily. 12/15/21   Jacky Kindle, FNP     Critical care provider statement:   Total critical care time: 33 minutes   Performed by: Karna Christmas MD   Critical care time was exclusive of separately billable procedures and treating other patients.   Critical care was necessary to treat or prevent imminent or life-threatening deterioration.   Critical care was time spent personally by me on the following activities: development of treatment plan with patient and/or surrogate as well as nursing, discussions with consultants, evaluation of patient's response to treatment, examination of patient, obtaining history from patient or surrogate, ordering and performing treatments and interventions, ordering and review of laboratory studies, ordering and review of radiographic studies, pulse oximetry and re-evaluation of patient's condition.    Vida Rigger, M.D.  Pulmonary & Critical Care Medicine

## 2023-01-30 ENCOUNTER — Telehealth: Payer: Self-pay | Admitting: Cardiology

## 2023-01-30 NOTE — Telephone Encounter (Signed)
Left voicemail, pt needs appointment scheduled for hospital follow up in about 2-3 weeks with [facility] or Fransico Michael.

## 2023-01-31 ENCOUNTER — Other Ambulatory Visit: Payer: Self-pay | Admitting: Family Medicine

## 2023-01-31 DIAGNOSIS — R918 Other nonspecific abnormal finding of lung field: Secondary | ICD-10-CM | POA: Diagnosis not present

## 2023-01-31 DIAGNOSIS — I25118 Atherosclerotic heart disease of native coronary artery with other forms of angina pectoris: Secondary | ICD-10-CM

## 2023-01-31 DIAGNOSIS — J449 Chronic obstructive pulmonary disease, unspecified: Secondary | ICD-10-CM | POA: Diagnosis not present

## 2023-01-31 NOTE — Telephone Encounter (Signed)
Requested medications are due for refill today.  unsure  Requested medications are on the active medications list.  no  Last refill. 12/09/2022  Future visit scheduled.   no  Notes to clinic.  Med not on med list.    Requested Prescriptions  Pending Prescriptions Disp Refills   meloxicam (MOBIC) 7.5 MG tablet [Pharmacy Med Name: MELOXICAM 7.5 MG TAB] 30 tablet     Sig: TAKE 1 TABLET BY MOUTH DAILY     Analgesics:  COX2 Inhibitors Failed - 01/31/2023 11:06 AM      Failed - Manual Review: Labs are only required if the patient has taken medication for more than 8 weeks.      Failed - HGB in normal range and within 360 days    Hemoglobin  Date Value Ref Range Status  01/28/2023 11.8 (L) 12.0 - 15.0 g/dL Final  43/32/9518 84.1 11.1 - 15.9 g/dL Final         Passed - Cr in normal range and within 360 days    Creatinine  Date Value Ref Range Status  04/08/2014 0.67 0.60 - 1.30 mg/dL Final   Creatinine, Ser  Date Value Ref Range Status  01/28/2023 0.72 0.44 - 1.00 mg/dL Final         Passed - HCT in normal range and within 360 days    HCT  Date Value Ref Range Status  01/28/2023 37.6 36.0 - 46.0 % Final   Hematocrit  Date Value Ref Range Status  11/03/2021 42.1 34.0 - 46.6 % Final         Passed - AST in normal range and within 360 days    AST  Date Value Ref Range Status  12/06/2022 24 15 - 41 U/L Final   SGOT(AST)  Date Value Ref Range Status  11/26/2012 25 15 - 37 Unit/L Final         Passed - ALT in normal range and within 360 days    ALT  Date Value Ref Range Status  12/06/2022 16 0 - 44 U/L Final   SGPT (ALT)  Date Value Ref Range Status  11/26/2012 21 12 - 78 U/L Final         Passed - eGFR is 30 or above and within 360 days    EGFR (African American)  Date Value Ref Range Status  04/08/2014 >60 >26mL/min Final  11/26/2012 >60  Final   GFR calc Af Amer  Date Value Ref Range Status  01/20/2020 >60 >60 mL/min Final   EGFR (Non-African Amer.)   Date Value Ref Range Status  04/08/2014 >60 >43mL/min Final    Comment:    eGFR values <20mL/min/1.73 m2 may be an indication of chronic kidney disease (CKD). Calculated eGFR, using the MRDR Study equation, is useful in  patients with stable renal function. The eGFR calculation will not be reliable in acutely ill patients when serum creatinine is changing rapidly. It is not useful in patients on dialysis. The eGFR calculation may not be applicable to patients at the low and high extremes of body sizes, pregnant women, and vegetarians.   11/26/2012 >60  Final    Comment:    eGFR values <18mL/min/1.73 m2 may be an indication of chronic kidney disease (CKD). Calculated eGFR is useful in patients with stable renal function. The eGFR calculation will not be reliable in acutely ill patients when serum creatinine is changing rapidly. It is not useful in  patients on dialysis. The eGFR calculation may not be applicable to  patients at the low and high extremes of body sizes, pregnant women, and vegetarians.    GFR, Estimated  Date Value Ref Range Status  01/28/2023 >60 >60 mL/min Final    Comment:    (NOTE) Calculated using the CKD-EPI Creatinine Equation (2021)    eGFR  Date Value Ref Range Status  11/03/2021 84 >59 mL/min/1.73 Final         Passed - Patient is not pregnant      Passed - Valid encounter within last 12 months    Recent Outpatient Visits           2 weeks ago Chronic cough   [facility] Jacky Kindle, FNP   1 month ago Anxiety about health   [facility] Merita Norton T, FNP   1 month ago Uncontrolled pain   [place] [facility] Merita Norton T, FNP   3 months ago Severe depression [facility])   [place] [facility] Merita Norton T, FNP   6 months ago Symptoms of upper respiratory infection (URI)   [place] [facility] [place], Edmon Crape, PA-C       Future  Appointments             In 3 weeks MacDiarmid, Lorin Picket, MD [facility] Urology [facility]

## 2023-01-31 NOTE — Telephone Encounter (Signed)
Requested Prescriptions  Pending Prescriptions Disp Refills   rosuvastatin (CRESTOR) 40 MG tablet [Pharmacy Med Name: ROSUVASTATIN CALCIUM 40 MG TAB] 90 tablet 1    Sig: TAKE 1 TABLET BY MOUTH DAILY     Cardiovascular:  Antilipid - Statins 2 Failed - 01/31/2023 11:05 AM      Failed - Lipid Panel in normal range within the last 12 months    Cholesterol, Total  Date Value Ref Range Status  05/26/2021 135 100 - 199 mg/dL Final   LDL Chol Calc (NIH)  Date Value Ref Range Status  05/26/2021 68 0 - 99 mg/dL Final   HDL  Date Value Ref Range Status  05/26/2021 46 >39 mg/dL Final   Triglycerides  Date Value Ref Range Status  01/26/2023 97 <150 mg/dL Final    Comment:    Performed at [facility], [address] [place], [place] [address]         Passed - Cr in normal range and within 360 days    Creatinine  Date Value Ref Range Status  04/08/2014 0.67 0.60 - 1.30 mg/dL Final   Creatinine, Ser  Date Value Ref Range Status  01/28/2023 0.72 0.44 - 1.00 mg/dL Final         Passed - Patient is not pregnant      Passed - Valid encounter within last 12 months    Recent Outpatient Visits           2 weeks ago Chronic cough   [place] [facility] Jacky Kindle, FNP   1 month ago Anxiety about health   [facility] Merita Norton T, FNP   1 month ago Uncontrolled pain   [place] [facility] Merita Norton T, FNP   3 months ago Severe depression [facility])   [facility] Merita Norton T, FNP   6 months ago Symptoms of upper respiratory infection (URI)   [place] [facility] [place], Edmon Crape, PA-C       Future Appointments             In 3 weeks MacDiarmid, Lorin Picket, MD [facility] Urology [facility]

## 2023-02-01 ENCOUNTER — Ambulatory Visit: Payer: Medicare HMO

## 2023-02-01 ENCOUNTER — Inpatient Hospital Stay: Payer: Medicare HMO | Attending: Oncology | Admitting: Oncology

## 2023-02-01 ENCOUNTER — Telehealth: Payer: Self-pay | Admitting: Medical

## 2023-02-01 ENCOUNTER — Telehealth: Payer: Self-pay

## 2023-02-01 VITALS — BP 122/65 | HR 88 | Temp 98.3°F | Wt 127.0 lb

## 2023-02-01 DIAGNOSIS — D519 Vitamin B12 deficiency anemia, unspecified: Secondary | ICD-10-CM | POA: Insufficient documentation

## 2023-02-01 DIAGNOSIS — Z8521 Personal history of malignant neoplasm of larynx: Secondary | ICD-10-CM | POA: Insufficient documentation

## 2023-02-01 DIAGNOSIS — C50511 Malignant neoplasm of lower-outer quadrant of right female breast: Secondary | ICD-10-CM

## 2023-02-01 DIAGNOSIS — Z87891 Personal history of nicotine dependence: Secondary | ICD-10-CM | POA: Insufficient documentation

## 2023-02-01 DIAGNOSIS — Z9221 Personal history of antineoplastic chemotherapy: Secondary | ICD-10-CM | POA: Insufficient documentation

## 2023-02-01 DIAGNOSIS — R590 Localized enlarged lymph nodes: Secondary | ICD-10-CM | POA: Diagnosis not present

## 2023-02-01 DIAGNOSIS — Z808 Family history of malignant neoplasm of other organs or systems: Secondary | ICD-10-CM | POA: Insufficient documentation

## 2023-02-01 DIAGNOSIS — Z171 Estrogen receptor negative status [ER-]: Secondary | ICD-10-CM | POA: Diagnosis not present

## 2023-02-01 DIAGNOSIS — Z923 Personal history of irradiation: Secondary | ICD-10-CM | POA: Insufficient documentation

## 2023-02-01 DIAGNOSIS — C3492 Malignant neoplasm of unspecified part of left bronchus or lung: Secondary | ICD-10-CM | POA: Insufficient documentation

## 2023-02-01 DIAGNOSIS — Z853 Personal history of malignant neoplasm of breast: Secondary | ICD-10-CM | POA: Diagnosis not present

## 2023-02-01 NOTE — Telephone Encounter (Signed)
Copied from CRM [id number]. Topic: General - Other >> Feb 01, 2023  8:36 AM Franchot Heidelberg wrote: Reason for CRM: April from [facility] wants to know if office received bone density fax from 01/24/2023.   Best contact: [phone]

## 2023-02-01 NOTE — Telephone Encounter (Signed)
The one I remember receiving did look like recommendations and may have been thrown out.

## 2023-02-01 NOTE — Telephone Encounter (Signed)
-----   Message from Cadence David Stall sent at 01/28/2023  7:08 AM EDT ----- Regarding: hosp follow-up Patient needs hospital follow-up in 2-3 weeks, thanks

## 2023-02-01 NOTE — Telephone Encounter (Signed)
I believe we have received this many times.  If it is what I remember seeing it is something they wanted Robynn Pane to respond to.  Robynn Pane do you recall getting this?

## 2023-02-01 NOTE — Telephone Encounter (Signed)
Left voicemail for hospital follow up

## 2023-02-02 NOTE — Progress Notes (Signed)
is normal.     Breath sounds: Normal breath sounds.  Abdominal:     General: Bowel sounds  are normal.     Palpations: Abdomen is soft.  Skin:    General: Skin is warm and dry.  Neurological:     General: No focal deficit present.     Mental Status: She is alert and oriented to person, place, and time.         Latest Ref Rng & Units 01/28/2023    4:07 AM  CMP  Glucose 70 - 99 mg/dL 161   BUN 8 - 23 mg/dL 25   Creatinine 0.96 - 1.00 mg/dL 0.45   Sodium 409 - 811 mmol/L 137   Potassium 3.5 - 5.1 mmol/L 4.3   Chloride 98 - 111 mmol/L 102   CO2 22 - 32 mmol/L 26   Calcium 8.9 - 10.3 mg/dL 8.5       Latest Ref Rng & Units 01/28/2023    4:07 AM  CBC  WBC 4.0 - 10.5 K/uL 7.5   Hemoglobin 12.0 - 15.0 g/dL 91.4   Hematocrit 78.2 - 46.0 % 37.6   Platelets 150 - 400 K/uL 139     No images are attached to the encounter.  DG Chest Port 1 View  Result Date: 01/28/2023 CLINICAL DATA:  Follow-up pneumothorax.  Left chest tube in place. EXAM: PORTABLE CHEST 1 VIEW COMPARISON:  01/27/2023 FINDINGS: Stable position of the left basilar chest tube. Small left apical pleural line is identified scratch set small left apical pneumothorax is again noted and appears unchanged compared with the previous exam. No pleural fluid, interstitial edema or airspace disease. Left midlung nodular opacity appears unchanged in the interval. IMPRESSION: 1. Stable position of left basilar chest tube. 2. Stable small left apical pneumothorax. 3. Unchanged left midlung nodular opacity. Electronically Signed   By: Signa Kell M.D.   On: 01/28/2023 08:51   DG Chest Port 1 View  Result Date: 01/28/2023 CLINICAL DATA:  Chest tube removal EXAM: PORTABLE CHEST 1 VIEW COMPARISON:  01/20/2023 FINDINGS: Interval movable of LEFT chest tube. Trace LEFT apical pneumothorax measures 2-3 mm from the apical chest wall LEFT mid lung nodules again noted. IMPRESSION: Trace LEFT apical pneumothorax following chest tube removal. LEFT midlung nodules. These results will be called to the ordering clinician or representative by  the Radiologist Assistant, and communication documented in the PACS or [employer]. Electronically Signed   By: Genevive Bi M.D.   On: 01/28/2023 08:42   ECHOCARDIOGRAM COMPLETE  Result Date: 01/27/2023    ECHOCARDIOGRAM REPORT   Patient Name:   Denise Watts Date of Exam: 01/27/2023 Medical Rec #:  [id number]    Height:       61.0 in Accession #:    [id number]   Weight:       137.1 lb Date of Birth:  [birth year]    BSA:          1.609 m Patient Age:    67 years     BP:           99/71 mmHg Patient Gender: F            HR:           82 bpm. Exam Location:  [facility] Procedure: 2D Echo, Cardiac Doppler and Color Doppler Indications:     Atrial Fibrillation I48.91  History:         Patient has no prior history of Echocardiogram examinations.  Hematology/Oncology Consult note [facility]  [phone] [phone]  Patient Care Team: Jacky Kindle, FNP as PCP - General (Family Medicine) Debbe Odea, MD as PCP - Cardiology (Cardiology) Creig Hines, MD as Consulting Physician (Oncology) Leafy Ro, MD as Consulting Physician (General Surgery) Dasher, Cliffton Asters, MD (Dermatology) Scheeler, Kermit Balo, PA-C as Physician Assistant (Plastic Surgery) Minna Antis, MD as Attending Physician (Emergency Medicine) Alvira Philips Leitha Bleak (Neurology) Dayna Barker, MD as Referring Physician (Internal Medicine) Coralyn Helling, MD (Inactive) as Consulting Physician (Pulmonary Disease) Gaspar Cola, RPH (Inactive) (Pharmacist) Pa, [facility])   Name of the patient: Denise Watts  [id number]  02/17/1956   Date of visit: 02/02/23  Diagnosis-  1. H/o breast cancer in 1997 2. H/o stage I SCC of vocal cord in 2015. 3. Lung nodules 4. Adrenal carcinoma of unknown primary s/p resection 5. Stage IaT1a NxcM0 ER weakly positive 1-10%, PR negative and her 2 negative   Chief complaint/ Reason for visit-discuss pathology results and further management  Heme/Onc history:  Oncology History Overview Note  1. Carcinoma of breast, T1, N0, M0 tumor diagnosis.  In January of 1997 2. Carcinoma of the vocal cord T1, N0, M0 tumor.  Status postradiation therapy 3. Abnormal CT scan of the chest (December, 2015) 4. Repeat CT scan of chest shows stable nodule (July, 2016) 5. Neutropenia with normal hemoglobin and normal platelet count (July, 2016)   Cancer of vocal cord [facility]) (Resolved)  11/10/2014 Initial Diagnosis   Cancer of vocal cord   Malignant neoplasm of lower-outer quadrant of right breast of female, estrogen receptor negative (HCC)  11/24/2017 Initial Diagnosis   Malignant neoplasm of lower-outer quadrant of right breast of female, estrogen receptor negative  (HCC)   01/12/2018 Cancer Staging   Staging form: Breast, AJCC 8th Edition - Clinical stage from 01/12/2018: Stage IB (cT1a, cN0, cM0, G3, ER+, PR-, HER2-) - Signed by Creig Hines, MD on 01/12/2018   01/30/2018 - 04/26/2018 Chemotherapy   The patient had dexamethasone (DECADRON) 4 MG tablet, 8 mg, Oral, 2 times daily, 1 of 1 cycle, Start date: 01/12/2018, End date: 08/03/2018 palonosetron (ALOXI) injection 0.25 mg, 0.25 mg, Intravenous,  Once, 4 of 4 cycles Administration: 0.25 mg (01/30/2018), 0.25 mg (02/27/2018), 0.25 mg (03/28/2018), 0.25 mg (04/26/2018) pegfilgrastim (NEULASTA ONPRO KIT) injection 6 mg, 6 mg, Subcutaneous, Once, 4 of 4 cycles Administration: 6 mg (01/30/2018), 6 mg (02/27/2018), 6 mg (03/28/2018), 6 mg (04/26/2018) cyclophosphamide (CYTOXAN) 1,000 mg in sodium chloride 0.9 % 250 mL chemo infusion, 600 mg/m2 = 1,000 mg, Intravenous,  Once, 4 of 4 cycles Administration: 1,000 mg (01/30/2018), 1,000 mg (02/27/2018), 1,000 mg (03/28/2018), 1,000 mg (04/26/2018) DOCEtaxel (TAXOTERE) 130 mg in sodium chloride 0.9 % 250 mL chemo infusion, 75 mg/m2 = 130 mg, Intravenous,  Once, 4 of 4 cycles Dose modification: 60 mg/m2 (original dose 75 mg/m2, Cycle 2, Reason: Dose not tolerated) Administration: 130 mg (01/30/2018), 100 mg (02/27/2018), 100 mg (03/28/2018), 100 mg (04/26/2018)  for chemotherapy treatment.     The patient was originally diagnosed with right breast cancer in 1997, 10 mm grade 3, ER pr positive her 2 negative, node negative status post surgery and radiation treatment only. She never received chemotherapy or hormone therapy. For her vocal cord cancer, she had extensive surgery and radiation therapy in 1998. Due to past history of smoking, she was also noted to have lung nodules. She is undergoing active surveillance imaging study of  is normal.     Breath sounds: Normal breath sounds.  Abdominal:     General: Bowel sounds  are normal.     Palpations: Abdomen is soft.  Skin:    General: Skin is warm and dry.  Neurological:     General: No focal deficit present.     Mental Status: She is alert and oriented to person, place, and time.         Latest Ref Rng & Units 01/28/2023    4:07 AM  CMP  Glucose 70 - 99 mg/dL 161   BUN 8 - 23 mg/dL 25   Creatinine 0.96 - 1.00 mg/dL 0.45   Sodium 409 - 811 mmol/L 137   Potassium 3.5 - 5.1 mmol/L 4.3   Chloride 98 - 111 mmol/L 102   CO2 22 - 32 mmol/L 26   Calcium 8.9 - 10.3 mg/dL 8.5       Latest Ref Rng & Units 01/28/2023    4:07 AM  CBC  WBC 4.0 - 10.5 K/uL 7.5   Hemoglobin 12.0 - 15.0 g/dL 91.4   Hematocrit 78.2 - 46.0 % 37.6   Platelets 150 - 400 K/uL 139     No images are attached to the encounter.  DG Chest Port 1 View  Result Date: 01/28/2023 CLINICAL DATA:  Follow-up pneumothorax.  Left chest tube in place. EXAM: PORTABLE CHEST 1 VIEW COMPARISON:  01/27/2023 FINDINGS: Stable position of the left basilar chest tube. Small left apical pleural line is identified scratch set small left apical pneumothorax is again noted and appears unchanged compared with the previous exam. No pleural fluid, interstitial edema or airspace disease. Left midlung nodular opacity appears unchanged in the interval. IMPRESSION: 1. Stable position of left basilar chest tube. 2. Stable small left apical pneumothorax. 3. Unchanged left midlung nodular opacity. Electronically Signed   By: Signa Kell M.D.   On: 01/28/2023 08:51   DG Chest Port 1 View  Result Date: 01/28/2023 CLINICAL DATA:  Chest tube removal EXAM: PORTABLE CHEST 1 VIEW COMPARISON:  01/20/2023 FINDINGS: Interval movable of LEFT chest tube. Trace LEFT apical pneumothorax measures 2-3 mm from the apical chest wall LEFT mid lung nodules again noted. IMPRESSION: Trace LEFT apical pneumothorax following chest tube removal. LEFT midlung nodules. These results will be called to the ordering clinician or representative by  the Radiologist Assistant, and communication documented in the PACS or [employer]. Electronically Signed   By: Genevive Bi M.D.   On: 01/28/2023 08:42   ECHOCARDIOGRAM COMPLETE  Result Date: 01/27/2023    ECHOCARDIOGRAM REPORT   Patient Name:   Denise Watts Date of Exam: 01/27/2023 Medical Rec #:  [id number]    Height:       61.0 in Accession #:    [id number]   Weight:       137.1 lb Date of Birth:  [birth year]    BSA:          1.609 m Patient Age:    67 years     BP:           99/71 mmHg Patient Gender: F            HR:           82 bpm. Exam Location:  [facility] Procedure: 2D Echo, Cardiac Doppler and Color Doppler Indications:     Atrial Fibrillation I48.91  History:         Patient has no prior history of Echocardiogram examinations.  Hematology/Oncology Consult note [facility]  [phone] [phone]  Patient Care Team: Jacky Kindle, FNP as PCP - General (Family Medicine) Debbe Odea, MD as PCP - Cardiology (Cardiology) Creig Hines, MD as Consulting Physician (Oncology) Leafy Ro, MD as Consulting Physician (General Surgery) Dasher, Cliffton Asters, MD (Dermatology) Scheeler, Kermit Balo, PA-C as Physician Assistant (Plastic Surgery) Minna Antis, MD as Attending Physician (Emergency Medicine) Alvira Philips Leitha Bleak (Neurology) Dayna Barker, MD as Referring Physician (Internal Medicine) Coralyn Helling, MD (Inactive) as Consulting Physician (Pulmonary Disease) Gaspar Cola, RPH (Inactive) (Pharmacist) Pa, [facility])   Name of the patient: Denise Watts  [id number]  02/17/1956   Date of visit: 02/02/23  Diagnosis-  1. H/o breast cancer in 1997 2. H/o stage I SCC of vocal cord in 2015. 3. Lung nodules 4. Adrenal carcinoma of unknown primary s/p resection 5. Stage IaT1a NxcM0 ER weakly positive 1-10%, PR negative and her 2 negative   Chief complaint/ Reason for visit-discuss pathology results and further management  Heme/Onc history:  Oncology History Overview Note  1. Carcinoma of breast, T1, N0, M0 tumor diagnosis.  In January of 1997 2. Carcinoma of the vocal cord T1, N0, M0 tumor.  Status postradiation therapy 3. Abnormal CT scan of the chest (December, 2015) 4. Repeat CT scan of chest shows stable nodule (July, 2016) 5. Neutropenia with normal hemoglobin and normal platelet count (July, 2016)   Cancer of vocal cord [facility]) (Resolved)  11/10/2014 Initial Diagnosis   Cancer of vocal cord   Malignant neoplasm of lower-outer quadrant of right breast of female, estrogen receptor negative (HCC)  11/24/2017 Initial Diagnosis   Malignant neoplasm of lower-outer quadrant of right breast of female, estrogen receptor negative  (HCC)   01/12/2018 Cancer Staging   Staging form: Breast, AJCC 8th Edition - Clinical stage from 01/12/2018: Stage IB (cT1a, cN0, cM0, G3, ER+, PR-, HER2-) - Signed by Creig Hines, MD on 01/12/2018   01/30/2018 - 04/26/2018 Chemotherapy   The patient had dexamethasone (DECADRON) 4 MG tablet, 8 mg, Oral, 2 times daily, 1 of 1 cycle, Start date: 01/12/2018, End date: 08/03/2018 palonosetron (ALOXI) injection 0.25 mg, 0.25 mg, Intravenous,  Once, 4 of 4 cycles Administration: 0.25 mg (01/30/2018), 0.25 mg (02/27/2018), 0.25 mg (03/28/2018), 0.25 mg (04/26/2018) pegfilgrastim (NEULASTA ONPRO KIT) injection 6 mg, 6 mg, Subcutaneous, Once, 4 of 4 cycles Administration: 6 mg (01/30/2018), 6 mg (02/27/2018), 6 mg (03/28/2018), 6 mg (04/26/2018) cyclophosphamide (CYTOXAN) 1,000 mg in sodium chloride 0.9 % 250 mL chemo infusion, 600 mg/m2 = 1,000 mg, Intravenous,  Once, 4 of 4 cycles Administration: 1,000 mg (01/30/2018), 1,000 mg (02/27/2018), 1,000 mg (03/28/2018), 1,000 mg (04/26/2018) DOCEtaxel (TAXOTERE) 130 mg in sodium chloride 0.9 % 250 mL chemo infusion, 75 mg/m2 = 130 mg, Intravenous,  Once, 4 of 4 cycles Dose modification: 60 mg/m2 (original dose 75 mg/m2, Cycle 2, Reason: Dose not tolerated) Administration: 130 mg (01/30/2018), 100 mg (02/27/2018), 100 mg (03/28/2018), 100 mg (04/26/2018)  for chemotherapy treatment.     The patient was originally diagnosed with right breast cancer in 1997, 10 mm grade 3, ER pr positive her 2 negative, node negative status post surgery and radiation treatment only. She never received chemotherapy or hormone therapy. For her vocal cord cancer, she had extensive surgery and radiation therapy in 1998. Due to past history of smoking, she was also noted to have lung nodules. She is undergoing active surveillance imaging study of  is normal.     Breath sounds: Normal breath sounds.  Abdominal:     General: Bowel sounds  are normal.     Palpations: Abdomen is soft.  Skin:    General: Skin is warm and dry.  Neurological:     General: No focal deficit present.     Mental Status: She is alert and oriented to person, place, and time.         Latest Ref Rng & Units 01/28/2023    4:07 AM  CMP  Glucose 70 - 99 mg/dL 161   BUN 8 - 23 mg/dL 25   Creatinine 0.96 - 1.00 mg/dL 0.45   Sodium 409 - 811 mmol/L 137   Potassium 3.5 - 5.1 mmol/L 4.3   Chloride 98 - 111 mmol/L 102   CO2 22 - 32 mmol/L 26   Calcium 8.9 - 10.3 mg/dL 8.5       Latest Ref Rng & Units 01/28/2023    4:07 AM  CBC  WBC 4.0 - 10.5 K/uL 7.5   Hemoglobin 12.0 - 15.0 g/dL 91.4   Hematocrit 78.2 - 46.0 % 37.6   Platelets 150 - 400 K/uL 139     No images are attached to the encounter.  DG Chest Port 1 View  Result Date: 01/28/2023 CLINICAL DATA:  Follow-up pneumothorax.  Left chest tube in place. EXAM: PORTABLE CHEST 1 VIEW COMPARISON:  01/27/2023 FINDINGS: Stable position of the left basilar chest tube. Small left apical pleural line is identified scratch set small left apical pneumothorax is again noted and appears unchanged compared with the previous exam. No pleural fluid, interstitial edema or airspace disease. Left midlung nodular opacity appears unchanged in the interval. IMPRESSION: 1. Stable position of left basilar chest tube. 2. Stable small left apical pneumothorax. 3. Unchanged left midlung nodular opacity. Electronically Signed   By: Signa Kell M.D.   On: 01/28/2023 08:51   DG Chest Port 1 View  Result Date: 01/28/2023 CLINICAL DATA:  Chest tube removal EXAM: PORTABLE CHEST 1 VIEW COMPARISON:  01/20/2023 FINDINGS: Interval movable of LEFT chest tube. Trace LEFT apical pneumothorax measures 2-3 mm from the apical chest wall LEFT mid lung nodules again noted. IMPRESSION: Trace LEFT apical pneumothorax following chest tube removal. LEFT midlung nodules. These results will be called to the ordering clinician or representative by  the Radiologist Assistant, and communication documented in the PACS or [employer]. Electronically Signed   By: Genevive Bi M.D.   On: 01/28/2023 08:42   ECHOCARDIOGRAM COMPLETE  Result Date: 01/27/2023    ECHOCARDIOGRAM REPORT   Patient Name:   Denise Watts Date of Exam: 01/27/2023 Medical Rec #:  [id number]    Height:       61.0 in Accession #:    [id number]   Weight:       137.1 lb Date of Birth:  [birth year]    BSA:          1.609 m Patient Age:    67 years     BP:           99/71 mmHg Patient Gender: F            HR:           82 bpm. Exam Location:  [facility] Procedure: 2D Echo, Cardiac Doppler and Color Doppler Indications:     Atrial Fibrillation I48.91  History:         Patient has no prior history of Echocardiogram examinations.  is normal.     Breath sounds: Normal breath sounds.  Abdominal:     General: Bowel sounds  are normal.     Palpations: Abdomen is soft.  Skin:    General: Skin is warm and dry.  Neurological:     General: No focal deficit present.     Mental Status: She is alert and oriented to person, place, and time.         Latest Ref Rng & Units 01/28/2023    4:07 AM  CMP  Glucose 70 - 99 mg/dL 161   BUN 8 - 23 mg/dL 25   Creatinine 0.96 - 1.00 mg/dL 0.45   Sodium 409 - 811 mmol/L 137   Potassium 3.5 - 5.1 mmol/L 4.3   Chloride 98 - 111 mmol/L 102   CO2 22 - 32 mmol/L 26   Calcium 8.9 - 10.3 mg/dL 8.5       Latest Ref Rng & Units 01/28/2023    4:07 AM  CBC  WBC 4.0 - 10.5 K/uL 7.5   Hemoglobin 12.0 - 15.0 g/dL 91.4   Hematocrit 78.2 - 46.0 % 37.6   Platelets 150 - 400 K/uL 139     No images are attached to the encounter.  DG Chest Port 1 View  Result Date: 01/28/2023 CLINICAL DATA:  Follow-up pneumothorax.  Left chest tube in place. EXAM: PORTABLE CHEST 1 VIEW COMPARISON:  01/27/2023 FINDINGS: Stable position of the left basilar chest tube. Small left apical pleural line is identified scratch set small left apical pneumothorax is again noted and appears unchanged compared with the previous exam. No pleural fluid, interstitial edema or airspace disease. Left midlung nodular opacity appears unchanged in the interval. IMPRESSION: 1. Stable position of left basilar chest tube. 2. Stable small left apical pneumothorax. 3. Unchanged left midlung nodular opacity. Electronically Signed   By: Signa Kell M.D.   On: 01/28/2023 08:51   DG Chest Port 1 View  Result Date: 01/28/2023 CLINICAL DATA:  Chest tube removal EXAM: PORTABLE CHEST 1 VIEW COMPARISON:  01/20/2023 FINDINGS: Interval movable of LEFT chest tube. Trace LEFT apical pneumothorax measures 2-3 mm from the apical chest wall LEFT mid lung nodules again noted. IMPRESSION: Trace LEFT apical pneumothorax following chest tube removal. LEFT midlung nodules. These results will be called to the ordering clinician or representative by  the Radiologist Assistant, and communication documented in the PACS or [employer]. Electronically Signed   By: Genevive Bi M.D.   On: 01/28/2023 08:42   ECHOCARDIOGRAM COMPLETE  Result Date: 01/27/2023    ECHOCARDIOGRAM REPORT   Patient Name:   Denise Watts Date of Exam: 01/27/2023 Medical Rec #:  [id number]    Height:       61.0 in Accession #:    [id number]   Weight:       137.1 lb Date of Birth:  [birth year]    BSA:          1.609 m Patient Age:    67 years     BP:           99/71 mmHg Patient Gender: F            HR:           82 bpm. Exam Location:  [facility] Procedure: 2D Echo, Cardiac Doppler and Color Doppler Indications:     Atrial Fibrillation I48.91  History:         Patient has no prior history of Echocardiogram examinations.  is normal.     Breath sounds: Normal breath sounds.  Abdominal:     General: Bowel sounds  are normal.     Palpations: Abdomen is soft.  Skin:    General: Skin is warm and dry.  Neurological:     General: No focal deficit present.     Mental Status: She is alert and oriented to person, place, and time.         Latest Ref Rng & Units 01/28/2023    4:07 AM  CMP  Glucose 70 - 99 mg/dL 161   BUN 8 - 23 mg/dL 25   Creatinine 0.96 - 1.00 mg/dL 0.45   Sodium 409 - 811 mmol/L 137   Potassium 3.5 - 5.1 mmol/L 4.3   Chloride 98 - 111 mmol/L 102   CO2 22 - 32 mmol/L 26   Calcium 8.9 - 10.3 mg/dL 8.5       Latest Ref Rng & Units 01/28/2023    4:07 AM  CBC  WBC 4.0 - 10.5 K/uL 7.5   Hemoglobin 12.0 - 15.0 g/dL 91.4   Hematocrit 78.2 - 46.0 % 37.6   Platelets 150 - 400 K/uL 139     No images are attached to the encounter.  DG Chest Port 1 View  Result Date: 01/28/2023 CLINICAL DATA:  Follow-up pneumothorax.  Left chest tube in place. EXAM: PORTABLE CHEST 1 VIEW COMPARISON:  01/27/2023 FINDINGS: Stable position of the left basilar chest tube. Small left apical pleural line is identified scratch set small left apical pneumothorax is again noted and appears unchanged compared with the previous exam. No pleural fluid, interstitial edema or airspace disease. Left midlung nodular opacity appears unchanged in the interval. IMPRESSION: 1. Stable position of left basilar chest tube. 2. Stable small left apical pneumothorax. 3. Unchanged left midlung nodular opacity. Electronically Signed   By: Signa Kell M.D.   On: 01/28/2023 08:51   DG Chest Port 1 View  Result Date: 01/28/2023 CLINICAL DATA:  Chest tube removal EXAM: PORTABLE CHEST 1 VIEW COMPARISON:  01/20/2023 FINDINGS: Interval movable of LEFT chest tube. Trace LEFT apical pneumothorax measures 2-3 mm from the apical chest wall LEFT mid lung nodules again noted. IMPRESSION: Trace LEFT apical pneumothorax following chest tube removal. LEFT midlung nodules. These results will be called to the ordering clinician or representative by  the Radiologist Assistant, and communication documented in the PACS or [employer]. Electronically Signed   By: Genevive Bi M.D.   On: 01/28/2023 08:42   ECHOCARDIOGRAM COMPLETE  Result Date: 01/27/2023    ECHOCARDIOGRAM REPORT   Patient Name:   Denise Watts Date of Exam: 01/27/2023 Medical Rec #:  [id number]    Height:       61.0 in Accession #:    [id number]   Weight:       137.1 lb Date of Birth:  [birth year]    BSA:          1.609 m Patient Age:    67 years     BP:           99/71 mmHg Patient Gender: F            HR:           82 bpm. Exam Location:  [facility] Procedure: 2D Echo, Cardiac Doppler and Color Doppler Indications:     Atrial Fibrillation I48.91  History:         Patient has no prior history of Echocardiogram examinations.  Hematology/Oncology Consult note [facility]  [phone] [phone]  Patient Care Team: Jacky Kindle, FNP as PCP - General (Family Medicine) Debbe Odea, MD as PCP - Cardiology (Cardiology) Creig Hines, MD as Consulting Physician (Oncology) Leafy Ro, MD as Consulting Physician (General Surgery) Dasher, Cliffton Asters, MD (Dermatology) Scheeler, Kermit Balo, PA-C as Physician Assistant (Plastic Surgery) Minna Antis, MD as Attending Physician (Emergency Medicine) Alvira Philips Leitha Bleak (Neurology) Dayna Barker, MD as Referring Physician (Internal Medicine) Coralyn Helling, MD (Inactive) as Consulting Physician (Pulmonary Disease) Gaspar Cola, RPH (Inactive) (Pharmacist) Pa, [facility])   Name of the patient: Denise Watts  [id number]  02/17/1956   Date of visit: 02/02/23  Diagnosis-  1. H/o breast cancer in 1997 2. H/o stage I SCC of vocal cord in 2015. 3. Lung nodules 4. Adrenal carcinoma of unknown primary s/p resection 5. Stage IaT1a NxcM0 ER weakly positive 1-10%, PR negative and her 2 negative   Chief complaint/ Reason for visit-discuss pathology results and further management  Heme/Onc history:  Oncology History Overview Note  1. Carcinoma of breast, T1, N0, M0 tumor diagnosis.  In January of 1997 2. Carcinoma of the vocal cord T1, N0, M0 tumor.  Status postradiation therapy 3. Abnormal CT scan of the chest (December, 2015) 4. Repeat CT scan of chest shows stable nodule (July, 2016) 5. Neutropenia with normal hemoglobin and normal platelet count (July, 2016)   Cancer of vocal cord [facility]) (Resolved)  11/10/2014 Initial Diagnosis   Cancer of vocal cord   Malignant neoplasm of lower-outer quadrant of right breast of female, estrogen receptor negative (HCC)  11/24/2017 Initial Diagnosis   Malignant neoplasm of lower-outer quadrant of right breast of female, estrogen receptor negative  (HCC)   01/12/2018 Cancer Staging   Staging form: Breast, AJCC 8th Edition - Clinical stage from 01/12/2018: Stage IB (cT1a, cN0, cM0, G3, ER+, PR-, HER2-) - Signed by Creig Hines, MD on 01/12/2018   01/30/2018 - 04/26/2018 Chemotherapy   The patient had dexamethasone (DECADRON) 4 MG tablet, 8 mg, Oral, 2 times daily, 1 of 1 cycle, Start date: 01/12/2018, End date: 08/03/2018 palonosetron (ALOXI) injection 0.25 mg, 0.25 mg, Intravenous,  Once, 4 of 4 cycles Administration: 0.25 mg (01/30/2018), 0.25 mg (02/27/2018), 0.25 mg (03/28/2018), 0.25 mg (04/26/2018) pegfilgrastim (NEULASTA ONPRO KIT) injection 6 mg, 6 mg, Subcutaneous, Once, 4 of 4 cycles Administration: 6 mg (01/30/2018), 6 mg (02/27/2018), 6 mg (03/28/2018), 6 mg (04/26/2018) cyclophosphamide (CYTOXAN) 1,000 mg in sodium chloride 0.9 % 250 mL chemo infusion, 600 mg/m2 = 1,000 mg, Intravenous,  Once, 4 of 4 cycles Administration: 1,000 mg (01/30/2018), 1,000 mg (02/27/2018), 1,000 mg (03/28/2018), 1,000 mg (04/26/2018) DOCEtaxel (TAXOTERE) 130 mg in sodium chloride 0.9 % 250 mL chemo infusion, 75 mg/m2 = 130 mg, Intravenous,  Once, 4 of 4 cycles Dose modification: 60 mg/m2 (original dose 75 mg/m2, Cycle 2, Reason: Dose not tolerated) Administration: 130 mg (01/30/2018), 100 mg (02/27/2018), 100 mg (03/28/2018), 100 mg (04/26/2018)  for chemotherapy treatment.     The patient was originally diagnosed with right breast cancer in 1997, 10 mm grade 3, ER pr positive her 2 negative, node negative status post surgery and radiation treatment only. She never received chemotherapy or hormone therapy. For her vocal cord cancer, she had extensive surgery and radiation therapy in 1998. Due to past history of smoking, she was also noted to have lung nodules. She is undergoing active surveillance imaging study of  Hematology/Oncology Consult note [facility]  [phone] [phone]  Patient Care Team: Jacky Kindle, FNP as PCP - General (Family Medicine) Debbe Odea, MD as PCP - Cardiology (Cardiology) Creig Hines, MD as Consulting Physician (Oncology) Leafy Ro, MD as Consulting Physician (General Surgery) Dasher, Cliffton Asters, MD (Dermatology) Scheeler, Kermit Balo, PA-C as Physician Assistant (Plastic Surgery) Minna Antis, MD as Attending Physician (Emergency Medicine) Alvira Philips Leitha Bleak (Neurology) Dayna Barker, MD as Referring Physician (Internal Medicine) Coralyn Helling, MD (Inactive) as Consulting Physician (Pulmonary Disease) Gaspar Cola, RPH (Inactive) (Pharmacist) Pa, [facility])   Name of the patient: Denise Watts  [id number]  02/17/1956   Date of visit: 02/02/23  Diagnosis-  1. H/o breast cancer in 1997 2. H/o stage I SCC of vocal cord in 2015. 3. Lung nodules 4. Adrenal carcinoma of unknown primary s/p resection 5. Stage IaT1a NxcM0 ER weakly positive 1-10%, PR negative and her 2 negative   Chief complaint/ Reason for visit-discuss pathology results and further management  Heme/Onc history:  Oncology History Overview Note  1. Carcinoma of breast, T1, N0, M0 tumor diagnosis.  In January of 1997 2. Carcinoma of the vocal cord T1, N0, M0 tumor.  Status postradiation therapy 3. Abnormal CT scan of the chest (December, 2015) 4. Repeat CT scan of chest shows stable nodule (July, 2016) 5. Neutropenia with normal hemoglobin and normal platelet count (July, 2016)   Cancer of vocal cord [facility]) (Resolved)  11/10/2014 Initial Diagnosis   Cancer of vocal cord   Malignant neoplasm of lower-outer quadrant of right breast of female, estrogen receptor negative (HCC)  11/24/2017 Initial Diagnosis   Malignant neoplasm of lower-outer quadrant of right breast of female, estrogen receptor negative  (HCC)   01/12/2018 Cancer Staging   Staging form: Breast, AJCC 8th Edition - Clinical stage from 01/12/2018: Stage IB (cT1a, cN0, cM0, G3, ER+, PR-, HER2-) - Signed by Creig Hines, MD on 01/12/2018   01/30/2018 - 04/26/2018 Chemotherapy   The patient had dexamethasone (DECADRON) 4 MG tablet, 8 mg, Oral, 2 times daily, 1 of 1 cycle, Start date: 01/12/2018, End date: 08/03/2018 palonosetron (ALOXI) injection 0.25 mg, 0.25 mg, Intravenous,  Once, 4 of 4 cycles Administration: 0.25 mg (01/30/2018), 0.25 mg (02/27/2018), 0.25 mg (03/28/2018), 0.25 mg (04/26/2018) pegfilgrastim (NEULASTA ONPRO KIT) injection 6 mg, 6 mg, Subcutaneous, Once, 4 of 4 cycles Administration: 6 mg (01/30/2018), 6 mg (02/27/2018), 6 mg (03/28/2018), 6 mg (04/26/2018) cyclophosphamide (CYTOXAN) 1,000 mg in sodium chloride 0.9 % 250 mL chemo infusion, 600 mg/m2 = 1,000 mg, Intravenous,  Once, 4 of 4 cycles Administration: 1,000 mg (01/30/2018), 1,000 mg (02/27/2018), 1,000 mg (03/28/2018), 1,000 mg (04/26/2018) DOCEtaxel (TAXOTERE) 130 mg in sodium chloride 0.9 % 250 mL chemo infusion, 75 mg/m2 = 130 mg, Intravenous,  Once, 4 of 4 cycles Dose modification: 60 mg/m2 (original dose 75 mg/m2, Cycle 2, Reason: Dose not tolerated) Administration: 130 mg (01/30/2018), 100 mg (02/27/2018), 100 mg (03/28/2018), 100 mg (04/26/2018)  for chemotherapy treatment.     The patient was originally diagnosed with right breast cancer in 1997, 10 mm grade 3, ER pr positive her 2 negative, node negative status post surgery and radiation treatment only. She never received chemotherapy or hormone therapy. For her vocal cord cancer, she had extensive surgery and radiation therapy in 1998. Due to past history of smoking, she was also noted to have lung nodules. She is undergoing active surveillance imaging study of  Hematology/Oncology Consult note [facility]  [phone] [phone]  Patient Care Team: Jacky Kindle, FNP as PCP - General (Family Medicine) Debbe Odea, MD as PCP - Cardiology (Cardiology) Creig Hines, MD as Consulting Physician (Oncology) Leafy Ro, MD as Consulting Physician (General Surgery) Dasher, Cliffton Asters, MD (Dermatology) Scheeler, Kermit Balo, PA-C as Physician Assistant (Plastic Surgery) Minna Antis, MD as Attending Physician (Emergency Medicine) Alvira Philips Leitha Bleak (Neurology) Dayna Barker, MD as Referring Physician (Internal Medicine) Coralyn Helling, MD (Inactive) as Consulting Physician (Pulmonary Disease) Gaspar Cola, RPH (Inactive) (Pharmacist) Pa, [facility])   Name of the patient: Denise Watts  [id number]  02/17/1956   Date of visit: 02/02/23  Diagnosis-  1. H/o breast cancer in 1997 2. H/o stage I SCC of vocal cord in 2015. 3. Lung nodules 4. Adrenal carcinoma of unknown primary s/p resection 5. Stage IaT1a NxcM0 ER weakly positive 1-10%, PR negative and her 2 negative   Chief complaint/ Reason for visit-discuss pathology results and further management  Heme/Onc history:  Oncology History Overview Note  1. Carcinoma of breast, T1, N0, M0 tumor diagnosis.  In January of 1997 2. Carcinoma of the vocal cord T1, N0, M0 tumor.  Status postradiation therapy 3. Abnormal CT scan of the chest (December, 2015) 4. Repeat CT scan of chest shows stable nodule (July, 2016) 5. Neutropenia with normal hemoglobin and normal platelet count (July, 2016)   Cancer of vocal cord [facility]) (Resolved)  11/10/2014 Initial Diagnosis   Cancer of vocal cord   Malignant neoplasm of lower-outer quadrant of right breast of female, estrogen receptor negative (HCC)  11/24/2017 Initial Diagnosis   Malignant neoplasm of lower-outer quadrant of right breast of female, estrogen receptor negative  (HCC)   01/12/2018 Cancer Staging   Staging form: Breast, AJCC 8th Edition - Clinical stage from 01/12/2018: Stage IB (cT1a, cN0, cM0, G3, ER+, PR-, HER2-) - Signed by Creig Hines, MD on 01/12/2018   01/30/2018 - 04/26/2018 Chemotherapy   The patient had dexamethasone (DECADRON) 4 MG tablet, 8 mg, Oral, 2 times daily, 1 of 1 cycle, Start date: 01/12/2018, End date: 08/03/2018 palonosetron (ALOXI) injection 0.25 mg, 0.25 mg, Intravenous,  Once, 4 of 4 cycles Administration: 0.25 mg (01/30/2018), 0.25 mg (02/27/2018), 0.25 mg (03/28/2018), 0.25 mg (04/26/2018) pegfilgrastim (NEULASTA ONPRO KIT) injection 6 mg, 6 mg, Subcutaneous, Once, 4 of 4 cycles Administration: 6 mg (01/30/2018), 6 mg (02/27/2018), 6 mg (03/28/2018), 6 mg (04/26/2018) cyclophosphamide (CYTOXAN) 1,000 mg in sodium chloride 0.9 % 250 mL chemo infusion, 600 mg/m2 = 1,000 mg, Intravenous,  Once, 4 of 4 cycles Administration: 1,000 mg (01/30/2018), 1,000 mg (02/27/2018), 1,000 mg (03/28/2018), 1,000 mg (04/26/2018) DOCEtaxel (TAXOTERE) 130 mg in sodium chloride 0.9 % 250 mL chemo infusion, 75 mg/m2 = 130 mg, Intravenous,  Once, 4 of 4 cycles Dose modification: 60 mg/m2 (original dose 75 mg/m2, Cycle 2, Reason: Dose not tolerated) Administration: 130 mg (01/30/2018), 100 mg (02/27/2018), 100 mg (03/28/2018), 100 mg (04/26/2018)  for chemotherapy treatment.     The patient was originally diagnosed with right breast cancer in 1997, 10 mm grade 3, ER pr positive her 2 negative, node negative status post surgery and radiation treatment only. She never received chemotherapy or hormone therapy. For her vocal cord cancer, she had extensive surgery and radiation therapy in 1998. Due to past history of smoking, she was also noted to have lung nodules. She is undergoing active surveillance imaging study of  is normal.     Breath sounds: Normal breath sounds.  Abdominal:     General: Bowel sounds  are normal.     Palpations: Abdomen is soft.  Skin:    General: Skin is warm and dry.  Neurological:     General: No focal deficit present.     Mental Status: She is alert and oriented to person, place, and time.         Latest Ref Rng & Units 01/28/2023    4:07 AM  CMP  Glucose 70 - 99 mg/dL 161   BUN 8 - 23 mg/dL 25   Creatinine 0.96 - 1.00 mg/dL 0.45   Sodium 409 - 811 mmol/L 137   Potassium 3.5 - 5.1 mmol/L 4.3   Chloride 98 - 111 mmol/L 102   CO2 22 - 32 mmol/L 26   Calcium 8.9 - 10.3 mg/dL 8.5       Latest Ref Rng & Units 01/28/2023    4:07 AM  CBC  WBC 4.0 - 10.5 K/uL 7.5   Hemoglobin 12.0 - 15.0 g/dL 91.4   Hematocrit 78.2 - 46.0 % 37.6   Platelets 150 - 400 K/uL 139     No images are attached to the encounter.  DG Chest Port 1 View  Result Date: 01/28/2023 CLINICAL DATA:  Follow-up pneumothorax.  Left chest tube in place. EXAM: PORTABLE CHEST 1 VIEW COMPARISON:  01/27/2023 FINDINGS: Stable position of the left basilar chest tube. Small left apical pleural line is identified scratch set small left apical pneumothorax is again noted and appears unchanged compared with the previous exam. No pleural fluid, interstitial edema or airspace disease. Left midlung nodular opacity appears unchanged in the interval. IMPRESSION: 1. Stable position of left basilar chest tube. 2. Stable small left apical pneumothorax. 3. Unchanged left midlung nodular opacity. Electronically Signed   By: Signa Kell M.D.   On: 01/28/2023 08:51   DG Chest Port 1 View  Result Date: 01/28/2023 CLINICAL DATA:  Chest tube removal EXAM: PORTABLE CHEST 1 VIEW COMPARISON:  01/20/2023 FINDINGS: Interval movable of LEFT chest tube. Trace LEFT apical pneumothorax measures 2-3 mm from the apical chest wall LEFT mid lung nodules again noted. IMPRESSION: Trace LEFT apical pneumothorax following chest tube removal. LEFT midlung nodules. These results will be called to the ordering clinician or representative by  the Radiologist Assistant, and communication documented in the PACS or [employer]. Electronically Signed   By: Genevive Bi M.D.   On: 01/28/2023 08:42   ECHOCARDIOGRAM COMPLETE  Result Date: 01/27/2023    ECHOCARDIOGRAM REPORT   Patient Name:   Denise Watts Date of Exam: 01/27/2023 Medical Rec #:  [id number]    Height:       61.0 in Accession #:    [id number]   Weight:       137.1 lb Date of Birth:  [birth year]    BSA:          1.609 m Patient Age:    67 years     BP:           99/71 mmHg Patient Gender: F            HR:           82 bpm. Exam Location:  [facility] Procedure: 2D Echo, Cardiac Doppler and Color Doppler Indications:     Atrial Fibrillation I48.91  History:         Patient has no prior history of Echocardiogram examinations.

## 2023-02-03 ENCOUNTER — Encounter: Payer: Self-pay | Admitting: Oncology

## 2023-02-03 ENCOUNTER — Other Ambulatory Visit: Payer: Self-pay | Admitting: Oncology

## 2023-02-03 MED ORDER — OXYCODONE HCL 5 MG PO TABS: 5.0000 mg | ORAL_TABLET | Freq: Three times a day (TID) | ORAL | 0 refills | Status: DC | PRN

## 2023-02-06 ENCOUNTER — Other Ambulatory Visit: Payer: Self-pay | Admitting: Radiology

## 2023-02-06 ENCOUNTER — Encounter: Payer: Self-pay | Admitting: Radiology

## 2023-02-06 DIAGNOSIS — C50511 Malignant neoplasm of lower-outer quadrant of right female breast: Secondary | ICD-10-CM

## 2023-02-06 NOTE — Progress Notes (Signed)
Patient for IR Port Placement on Tues 02/07/2023, I called and spoke with the patient on the phone and gave pre-procedure instructions. Pt was made aware to be here at 12p, NPO after MN prior to procedure as well as driver post procedure/recovery/discharge. Pt stated understanding.  Called 02/06/2023

## 2023-02-06 NOTE — H&P (Incomplete)
Chief Complaint: Chemotherapy access. Request is for portacath placement.   Referring Physician(s): Rao,Archana C  Supervising Physician: Pernell Dupre  Patient Status: [facility] - Out-pt  History of Present Illness: Denise Watts is a 67 y.o. female 67 y.o. female outpatient. 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 Adrenal carcinoma of unknown primary s/p right adrenal gland adrenalectomy. Recently found to have intra thoracic mediastinal subcarinal and left hilar adenopathy. Cytology shows malignancy however team is uncertain if it is metastatic breast cancer of lung primary. Team is requesting a portacath placement for chemotherapy.   Currently without any significant complaints. Patient alert and laying in bed,calm. Denies any fevers, headache, chest pain, SOB, cough, abdominal pain, nausea, vomiting or bleeding. Return precautions and treatment recommendations and follow-up discussed with the patient *** who is agreeable with the plan.     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   COPD (chronic obstructive pulmonary disease) (HCC)    Coronary artery disease    Cough 03/02/2017   INTERMTTENT DRY COUGH   Depression    Dyspnea    GERD (gastroesophageal reflux disease)    History of hiatal hernia    Personal history of chemotherapy    Personal history of radiation therapy    Skin cancer    Throat cancer (HCC) 1998   radiation   Wears dentures    full upper and lower    Past Surgical History:  Procedure Laterality Date   BIOPSY  01/05/2023   Procedure: BIOPSY;  Surgeon: Toney Reil, MD;  Location: [facility] ENDOSCOPY;  Service: Gastroenterology;;   BREAST BIOPSY Right 1997    positive   BREAST BIOPSY Right 11/15/2017   INVASIVE MAMMARY CARCINOMA triple negative   BREAST BIOPSY Right 07/20/2020   [place] bx, Q marker, neg   BREAST LUMPECTOMY Right 1997   2019 also   BREAST LUMPECTOMY WITH SENTINEL LYMPH NODE BIOPSY Right 12/08/2017   Procedure: BREAST LUMPECTOMY WITH SENTINEL LYMPH NODE BX;  Surgeon: Earline Mayotte, MD;  Location: [facility] ORS;  Service: General;  Laterality: Right;   BREAST REDUCTION WITH MASTOPEXY Left 06/10/2019   Procedure: Left breast mastopexy reduction for symmetry;  Surgeon: Peggye Form, DO;  Location: [facility];  Service: Plastics;  Laterality: Left;  total 2.5 hours   CATARACT EXTRACTION W/PHACO Right 02/07/2022   Procedure: CATARACT EXTRACTION PHACO AND INTRAOCULAR LENS PLACEMENT (IOC) RIGHT DIABETIC;  Surgeon: Nevada Crane, MD;  Location: [facility];  Service: Ophthalmology;  Laterality: Right;  3.60 00:34.7   CATARACT EXTRACTION W/PHACO Left 02/28/2022   Procedure: CATARACT EXTRACTION PHACO AND INTRAOCULAR LENS PLACEMENT (IOC) LEFT DIABETIC 4.11 00:26.1;  Surgeon: Nevada Crane, MD;  Location: [facility];  Service: Ophthalmology;  Laterality: Left;   COLONOSCOPY  2015   COLONOSCOPY N/A 01/05/2023   Procedure: COLONOSCOPY;  Surgeon: Toney Reil, MD;  Location: [facility];  Service: Gastroenterology;  Laterality: N/A;   COLONOSCOPY WITH PROPOFOL N/A 10/29/2019   Procedure: COLONOSCOPY WITH PROPOFOL;  Surgeon: Midge Minium, MD;  Location: [facility] END[facility]COPY;  Service: Endoscopy;  Laterality: N/A;   ESOPHAGOGASTRODUODENOSCOPY (EGD) WITH PROPOFOL N/A 05/24/2019   Procedure: ESOPHAGOGASTRODUODENOSCOPY (EGD)  WITH PROPOFOL;  Surgeon: Midge Minium, MD;  Location: [facility];  Service: Endoscopy;  Laterality: N/A;   ESOPHAGOGASTRODUODENOSCOPY (EGD) WITH PROPOFOL N/A 09/06/2022   Procedure: ESOPHAGOGASTRODUODENOSCOPY (EGD) WITH PROPOFOL;  Surgeon: Midge Minium, MD;  Location: [facility] END[facility]COPY;   Service: Endoscopy;  Laterality: N/A;   ESOPHAGOGASTRODUODENOSCOPY (EGD) WITH PROPOFOL  01/05/2023   Procedure: ESOPHAGOGASTRODUODENOSCOPY (EGD) WITH PROPOFOL;  Surgeon: Toney Reil, MD;  Location: [facility];  Service: Gastroenterology;;   ESOPHAGUS SURGERY     KNEE SURGERY Right    LIPOSUCTION WITH LIPOFILLING Right 06/10/2019   Procedure: right breast scar and capsule release with fat grafting;  Surgeon: Peggye Form, DO;  Location: [facility];  Service: Plastics;  Laterality: Right;   POLYPECTOMY  01/05/2023   Procedure: POLYPECTOMY;  Surgeon: Toney Reil, MD;  Location: [facility] ENDOSCOPY;  Service: Gastroenterology;;   [facility] REMOVAL     PORTACATH PLACEMENT Right 01/17/2018   Procedure: INSERTION PORT-A-CATH- RIGHT;  Surgeon: Earline Mayotte, MD;  Location: [facility]S;  Service: General;  Laterality: Right;   ROBOTIC ADRENALECTOMY Right 12/28/2016   Procedure: ROBOTIC ADRENALECTOMY;  Surgeon: Vanna Scotland, MD;  Location: [facility];  Service: Urology;  Laterality: Right;   SKIN CANCER EXCISION     THROAT SURGERY  1998   throat cancer    TUBAL LIGATION     VENTRAL HERNIA REPAIR N/A 03/03/2017   Procedure: HERNIA REPAIR VENTRAL ADULT;  Surgeon: Ricarda Frame, MD;  Location: [facility]S;  Service: General;  Laterality: N/A;   VIDEO BRONCHOSCOPY WITH ENDOBRONCHIAL ULTRASOUND N/A 01/25/2023   Procedure: VIDEO BRONCHOSCOPY WITH ENDOBRONCHIAL ULTRASOUND;  Surgeon: Vida Rigger, MD;  Location: [facility] ORS;  Service: Thoracic;  Laterality: N/A;    Allergies: Sulfa antibiotics  Medications: Prior to Admission medications   Medication Sig Start Date End Date Taking? Authorizing Provider  aspirin EC 81 MG tablet Take 1 tablet (81 mg total) by mouth daily. Swallow whole. 12/08/21   Furth, Cadence H, PA-C  buPROPion (WELLBUTRIN XL) 150 MG 24 hr tablet Take 3 tablets (450 mg total) by mouth daily. 01/16/23   Jacky Kindle, FNP  busPIRone (BUSPAR) 5 MG  tablet Take 1 tablet (5 mg total) by mouth 3 (three) times daily. 10/12/22   Jacky Kindle, FNP  cyclobenzaprine (FLEXERIL) 5 MG tablet TAKE 1 TABLET BY MOUTH AT BEDTIME 12/14/22   Jacky Kindle, FNP  DULoxetine (CYMBALTA) 60 MG capsule Take 1 capsule (60 mg total) by mouth 2 (two) times daily. 10/12/22   Jacky Kindle, FNP  meloxicam (MOBIC) 7.5 MG tablet TAKE 1 TABLET BY MOUTH DAILY 02/01/23   Merita Norton T, FNP  montelukast (SINGULAIR) 10 MG tablet Take 1 tablet (10 mg total) by mouth at bedtime. 07/14/22   Bacigalupo, Marzella Schlein, MD  oxyCODONE (ROXICODONE) 5 MG immediate release tablet Take 1 tablet (5 mg total) by mouth every 8 (eight) hours as needed for severe pain or moderate pain. 02/03/23   Creig Hines, MD  rOPINIRole (REQUIP) 0.25 MG tablet TAKE 1 TABLET BY MOUTH AT BEDTIME 08/04/22   Jacky Kindle, FNP  rosuvastatin (CRESTOR) 40 MG tablet TAKE 1 TABLET BY MOUTH DAILY 01/31/23   Jacky Kindle, FNP     Family History  Problem Relation Age of Onset   Diabetes Mother    Heart attack Mother    Cancer Sister        cancer of eyes, spread to lung, other places. died at 69   Cancer Sister  35       unknown primary, spread to bone, brain died in 65's   Cancer Paternal Aunt        unk type   Cancer Paternal Grandmother        type unk   Prostate cancer Neg Hx    Kidney cancer Neg Hx    Bladder Cancer Neg Hx    Breast cancer Neg Hx     Social History   Socioeconomic History   Marital status: Married    Spouse name: Sharie, Natali (Spouse) [phone] ([place])   Number of children: 4   Years of education: Not on file   Highest education level: 10th grade  Occupational History   Occupation: retired  Tobacco Use   Smoking status: Former    Current packs/day: 0.00    Average packs/day: 1.5 packs/day for 30.0 years (45.0 ttl pk-yrs)    Types: Cigarettes    Start date: 05/02/1965    Quit date: 05/03/1995    Years since quitting: 27.7    Passive exposure: Past   Smokeless tobacco:  Never  Vaping Use   Vaping status: Never Used  Substance and Sexual Activity   Alcohol use: Not Currently    Alcohol/week: 4.0 standard drinks of alcohol    Types: 4 Shots of liquor per week    Comment: none   Drug use: No   Sexual activity: Not Currently    Birth control/protection: Post-menopausal  Other Topics Concern   Not on file  Social History Narrative   Not on file   Social Determinants of Health   Financial Resource Strain: Low Risk  (12/02/2021)   Overall Financial Resource Strain (CARDIA)    Difficulty of Paying Living Expenses: Not very hard  Food Insecurity: No Food Insecurity (12/05/2022)   Hunger Vital Sign    Worried About Running Out of Food in the Last Year: Never true    Ran Out of Food in the Last Year: Never true  Transportation Needs: No Transportation Needs (12/05/2022)   PRAPARE - Administrator, Civil Service (Medical): No    Lack of Transportation (Non-Medical): No  Physical Activity: Insufficiently Active (12/05/2022)   Exercise Vital Sign    Days of Exercise per Week: 3 days    Minutes of Exercise per Session: 30 min  Stress: Stress Concern Present (12/05/2022)   [employer] of Occupational Health - Occupational Stress Questionnaire    Feeling of Stress : Rather much  Social Connections: Moderately Isolated (12/05/2022)   Social Connection and Isolation Panel [NHANES]    Frequency of Communication with Friends and Family: Three times a week    Frequency of Social Gatherings with Friends and Family: Twice a week    Attends Religious Services: Never    Database administrator or Organizations: No    Attends Banker Meetings: Never    Marital Status: Married    ECOG Status: {CHL ONC ECOG WG:[id number]}  Review of Systems: A 12 point ROS discussed and pertinent positives are indicated in the HPI above.  All other systems are negative.  Review of Systems  Vital Signs: There were no vitals taken for this visit.  Advance  Care Plan: {Advance Care HQIO:[id number]}    Physical Exam  Imaging: DG Chest Port 1 View  Result Date: 01/28/2023 CLINICAL DATA:  Follow-up pneumothorax.  Left chest tube in place. EXAM: PORTABLE CHEST 1 VIEW COMPARISON:  01/27/2023 FINDINGS: Stable position of the left basilar chest tube. Small left apical  pleural line is identified scratch set small left apical pneumothorax is again noted and appears unchanged compared with the previous exam. No pleural fluid, interstitial edema or airspace disease. Left midlung nodular opacity appears unchanged in the interval. IMPRESSION: 1. Stable position of left basilar chest tube. 2. Stable small left apical pneumothorax. 3. Unchanged left midlung nodular opacity. Electronically Signed   By: Signa Kell M.D.   On: 01/28/2023 08:51   DG Chest Port 1 View  Result Date: 01/28/2023 CLINICAL DATA:  Chest tube removal EXAM: PORTABLE CHEST 1 VIEW COMPARISON:  01/20/2023 FINDINGS: Interval movable of LEFT chest tube. Trace LEFT apical pneumothorax measures 2-3 mm from the apical chest wall LEFT mid lung nodules again noted. IMPRESSION: Trace LEFT apical pneumothorax following chest tube removal. LEFT midlung nodules. These results will be called to the ordering clinician or representative by the Radiologist Assistant, and communication documented in the PACS or [employer]. Electronically Signed   By: Genevive Bi M.D.   On: 01/28/2023 08:42   ECHOCARDIOGRAM COMPLETE  Result Date: 01/27/2023    ECHOCARDIOGRAM REPORT   Patient Name:   Denise Watts Date of Exam: 01/27/2023 Medical Rec #:  [id number]    Height:       61.0 in Accession #:    [id number]   Weight:       137.1 lb Date of Birth:  06-19-1955    BSA:          1.609 m Patient Age:    67 years     BP:           99/71 mmHg Patient Gender: F            HR:           82 bpm. Exam Location:  [facility] Procedure: 2D Echo, Cardiac Doppler and Color Doppler Indications:     Atrial Fibrillation I48.91  History:          Patient has no prior history of Echocardiogram examinations.                  CAD; Signs/Symptoms:Dyspnea.  Sonographer:     Neysa Bonito Roar Referring Phys:  [id number] Judithe Modest Diagnosing Phys: Debbe Odea MD  Sonographer Comments: Technically difficult study due to poor echo windows and Technically challenging study due to limited acoustic windows. Image acquisition challenging due to respiratory motion. IMPRESSIONS  1. Left ventricular ejection fraction, by estimation, is 50 to 55%. The left ventricle has low normal function. Left ventricular endocardial border not optimally defined to evaluate regional wall motion. Left ventricular diastolic parameters are consistent with Grade I diastolic dysfunction (impaired relaxation).  2. Right ventricular systolic function is low normal. The right ventricular size is not well visualized.  3. Right atrial size was mildly dilated.  4. The mitral valve is normal in structure. Mild mitral valve regurgitation.  5. Tricuspid valve regurgitation is moderate.  6. The aortic valve was not well visualized. Aortic valve regurgitation is not visualized. FINDINGS  Left Ventricle: Left ventricular ejection fraction, by estimation, is 50 to 55%. The left ventricle has low normal function. Left ventricular endocardial border not optimally defined to evaluate regional wall motion. The left ventricular internal cavity  size was normal in size. There is no left ventricular hypertrophy. Left ventricular diastolic parameters are consistent with Grade I diastolic dysfunction (impaired relaxation). Right Ventricle: The right ventricular size is not well visualized. No increase in right ventricular wall thickness. Right ventricular systolic function is low  normal. Left Atrium: Left atrial size was normal in size. Right Atrium: Right atrial size was mildly dilated. Pericardium: There is no evidence of pericardial effusion. Mitral Valve: The mitral valve is normal in structure. Mild mitral  valve regurgitation. MV peak gradient, 2.4 mmHg. The mean mitral valve gradient is 1.0 mmHg. Tricuspid Valve: The tricuspid valve is normal in structure. Tricuspid valve regurgitation is moderate. Aortic Valve: The aortic valve was not well visualized. Aortic valve regurgitation is not visualized. Pulmonic Valve: The pulmonic valve was not well visualized. Pulmonic valve regurgitation is not visualized. Aorta: The aortic root is normal in size and structure. IAS/Shunts: No atrial level shunt detected by color flow Doppler.  LEFT VENTRICLE PLAX 2D LVIDd:         3.40 cm   Diastology LVIDs:         2.60 cm   LV e' medial:    8.16 cm/s LV PW:         0.70 cm   LV E/e' medial:  7.6 LV IVS:        1.00 cm   LV e' lateral:   10.10 cm/s LVOT diam:     1.60 cm   LV E/e' lateral: 6.1 LVOT Area:     2.01 cm  RIGHT VENTRICLE RV Basal diam:  3.60 cm RV Mid diam:    3.00 cm LEFT ATRIUM           Index        RIGHT ATRIUM           Index LA diam:      3.20 cm 1.99 cm/m   RA Area:     15.80 cm LA Vol (A4C): 31.5 ml 19.58 ml/m  RA Volume:   38.20 ml  23.74 ml/m   AORTA Ao Root diam: 1.80 cm MITRAL VALVE               TRICUSPID VALVE MV Area (PHT): 4.49 cm    TR Peak grad:   26.4 mmHg MV Peak grad:  2.4 mmHg    TR Vmax:        257.00 cm/s MV Mean grad:  1.0 mmHg MV Vmax:       0.78 m/s    SHUNTS MV Vmean:      51.1 cm/s   Systemic Diam: 1.60 cm MV Decel Time: 169 msec MV E velocity: 61.80 cm/s MV A velocity: 74.40 cm/s MV E/A ratio:  0.83 MV A Prime:    12.0 cm/s Debbe Odea MD Electronically signed by Debbe Odea MD Signature Date/Time: 01/27/2023/5:08:46 PM    Final    CT CHEST WO CONTRAST  Result Date: 01/27/2023 CLINICAL DATA:  Status post left-sided lung biopsy 01/06/2023. Personal history of breast cancer. * Tracking Code: BO * EXAM: CT CHEST WITHOUT CONTRAST TECHNIQUE: Multidetector CT imaging of the chest was performed following the standard protocol without IV contrast. RADIATION DOSE REDUCTION: This  exam was performed according to the departmental dose-optimization program which includes automated exposure control, adjustment of the mA and/or kV according to patient size and/or use of iterative reconstruction technique. COMPARISON:  PET-CT 12/19/2022.  CTA chest 12/06/2022 FINDINGS: Cardiovascular: The heart size is normal. No substantial pericardial effusion. Coronary artery calcification is evident. Mild atherosclerotic calcification is noted in the wall of the thoracic aorta. Mediastinum/Nodes: No mediastinal lymphadenopathy although multiple mediastinal and left hilar nodes are seen to be hypermetabolic on previous PET imaging. No evidence for gross hilar lymphadenopathy although assessment is limited by  the lack of intravenous contrast on the current study. The esophagus has normal imaging features. There is no axillary lymphadenopathy. Lungs/Pleura: Stable 4 mm left upper lobe nodule on 28/4. 1.4 x 1.3 cm peripheral left upper lobe nodule on 67/4 is similar to prior. 1.1 x 0.9 cm left lower lobe nodule on 63/4 is not substantially changed. Innumerable additional tiny 2-5 mm pulmonary nodules are seen distributed through both lungs. Adjacent 12 and 7 mm ground-glass opacities are seen along the fissure of the right lung on image 78/4, similar. Consolidation. No pleural effusion. No focal airspace Upper Abdomen: Small exophytic cyst noted upper pole left kidney. No followup imaging is recommended. Musculoskeletal: Fracture of the posterior right ninth rib with associated lucency raising the question of pathologic fracture. Surgical changes/fat necrosis noted right breast. IMPRESSION: 1. No substantial interval change in the appearance of the chest. 2. Several dominant pulmonary nodules are stable with innumerable bilateral tiny pulmonary nodules, similar to prior. 3. Fracture of the posterior right ninth rib with associated lucency raising the question of pathologic fracture. 4.  Aortic Atherosclerosis  (ICD10-I70.0). Electronically Signed   By: Kennith Center M.D.   On: 01/27/2023 15:22   DG Chest Port 1 View  Result Date: 01/27/2023 CLINICAL DATA:  Left-sided chest pain EXAM: PORTABLE CHEST 1 VIEW COMPARISON:  Earlier same day FINDINGS: Cardiopericardial silhouette is at upper limits of normal for size. Left chest tube remains in place with continued further decrease in size of the apical left pneumothorax. Right lung clear. Nodular density left lower lung again noted. Telemetry leads overlie the chest. IMPRESSION: Continued further decrease in size of the apical left pneumothorax. Electronically Signed   By: Kennith Center M.D.   On: 01/27/2023 15:13   DG Chest Port 1 View  Result Date: 01/27/2023 CLINICAL DATA:  Follow-up pneumothorax. EXAM: PORTABLE CHEST 1 VIEW COMPARISON:  Chest radiograph 01/26/2023 FINDINGS: The cardiac silhouette is mildly enlarged. A left-sided chest tube remains in place, terminating over the lateral left lung base. A small to moderate-sized left apical pneumothorax has mildly decreased in size. Lung volumes are lower than on the prior study with increased patchy opacities in the left greater than right lung bases. Left lower lung nodules are again noted. No sizable pleural effusion is identified. IMPRESSION: 1. Mildly decreased size of left apical pneumothorax. 2. Decreased lung volumes with increased bibasilar atelectasis or infiltrates. Electronically Signed   By: Sebastian Ache M.D.   On: 01/27/2023 08:53   DG Chest Port 1 View  Result Date: 01/26/2023 CLINICAL DATA:  Chest tube EXAM: PORTABLE CHEST 1 VIEW COMPARISON:  01/26/2023, CT 01/23/2023 FINDINGS: Removal of endotracheal and esophageal tubes. Left lower chest drainage catheter with pigtail at the CP angle. Left lower lung nodule. Interval increase in size of left apical pneumothorax, now demonstrating 3.4 cm of pleural-parenchymal separation IMPRESSION: Interval increase in size of left apical pneumothorax now  demonstrating up to 3.4 cm pleural-parenchymal separation at the apex. Left lower chest drainage catheter remains in place. These results will be called to the ordering clinician or representative by the Radiologist Assistant, and communication documented in the PACS or [employer]. Electronically Signed   By: Jasmine Pang M.D.   On: 01/26/2023 19:05   DG Chest Port 1 View  Result Date: 01/26/2023 CLINICAL DATA:  Pneumothorax EXAM: PORTABLE CHEST 1 VIEW COMPARISON:  Chest radiograph 1 day prior FINDINGS: The endotracheal tube tip is proximally 2.8 cm from the carina. The left basilar chest tube is stable. The  enteric catheter tip is off the field of view but the side-hole projects over the stomach. Aeration of the left lung has significantly improved in the interim a 1.2 cm nodule projecting over the left midlung is noted, better seen on recent chest CT. There is no new or worsening focal airspace disease. There is no significant effusion. There is a trace left apical pneumothorax. There is no significant right pneumothorax There is no acute osseous abnormality. IMPRESSION: 1. Trace left apical pneumothorax with chest tube in place. 2. Markedly improved aeration of the left lung. Electronically Signed   By: Lesia Hausen M.D.   On: 01/26/2023 09:12   DG Chest 1 View  Result Date: 01/25/2023 CLINICAL DATA:  [id number] Chest tube in place [id number] Encounter for imaging study to confirm orogastric (OG) tube placement [id number] EXAM: CHEST  1 VIEW; ABDOMEN - 1 VIEW COMPARISON:  Chest x-ray 01/25/2023 trauma CT chest 01/23/2023 FINDINGS: Endotracheal tube terminates 3.7 cm above the carina. Left chest tube pigtail overlies the lower left hemithorax. Enteric tube courses below the hemidiaphragm with tip and side port overlying the gastric lumen. The heart and mediastinal contours are unchanged. Atherosclerotic plaque. Patient is rotated on chest x-ray. Diffuse interstitial and patchy airspace opacities at the  left lung. At least trace left pleural effusion. Resolution of left pneumothorax. No right pleural effusion pain no pneumothorax. Nonobstructive bowel gas pattern. No acute osseous abnormality. IMPRESSION: 1. Endotracheal and enteric tubes are in good position 2. Resolution of left pneumothorax. At least trace left pleural effusion. Left chest tube pigtail overlies the lower left hemithorax. 3. Diffuse interstitial and patchy airspace opacities at the left lung. Electronically Signed   By: Tish Frederickson M.D.   On: 01/25/2023 18:03   DG Abd 1 View  Result Date: 01/25/2023 CLINICAL DATA:  [id number] Chest tube in place [id number] Encounter for imaging study to confirm orogastric (OG) tube placement [id number] EXAM: CHEST  1 VIEW; ABDOMEN - 1 VIEW COMPARISON:  Chest x-ray 01/25/2023 trauma CT chest 01/23/2023 FINDINGS: Endotracheal tube terminates 3.7 cm above the carina. Left chest tube pigtail overlies the lower left hemithorax. Enteric tube courses below the hemidiaphragm with tip and side port overlying the gastric lumen. The heart and mediastinal contours are unchanged. Atherosclerotic plaque. Patient is rotated on chest x-ray. Diffuse interstitial and patchy airspace opacities at the left lung. At least trace left pleural effusion. Resolution of left pneumothorax. No right pleural effusion pain no pneumothorax. Nonobstructive bowel gas pattern. No acute osseous abnormality. IMPRESSION: 1. Endotracheal and enteric tubes are in good position 2. Resolution of left pneumothorax. At least trace left pleural effusion. Left chest tube pigtail overlies the lower left hemithorax. 3. Diffuse interstitial and patchy airspace opacities at the left lung. Electronically Signed   By: Tish Frederickson M.D.   On: 01/25/2023 18:03   DG Chest Port 1 View  Result Date: 01/25/2023 CLINICAL DATA:  Status post bronchoscopy EXAM: PORTABLE CHEST 1 VIEW COMPARISON:  Chest x-ray 01/25/2023. FINDINGS: Large left pneumothorax with  rightward mediastinal shift. Opacities within the compressed left lung likely are in part due to compressive atelectasis. Known nodules are not well evaluated on this study. Right lung is clear. No visible pleural effusions. No acute bony abnormality. IMPRESSION: 1. Large left pneumothorax with rightward mediastinal shift. 2. The clinical team is aware of this finding given a chest tube has already been placed on a subsequent chest x-ray visible at this time. Electronically Signed   By: Juluis Mire.D.  On: 01/25/2023 16:42   DG C-Arm 1-60 Min-No Report  Result Date: 01/25/2023 Fluoroscopy was utilized by the requesting physician.  No radiographic interpretation.   DG C-Arm 1-60 Min-No Report  Result Date: 01/25/2023 Fluoroscopy was utilized by the requesting physician.  No radiographic interpretation.    Labs:  CBC: Recent Labs    01/26/23 0511 01/27/23 0503 01/27/23 1331 01/28/23 0407  WBC 8.0 9.4 9.3 7.5  HGB 12.9 12.6 12.4 11.8*  HCT 40.1 39.2 39.2 37.6  PLT 165 156 150 139*    COAGS: Recent Labs    01/06/23 0821 01/23/23 1317  INR 1.0 1.0  APTT  --  26    BMP: Recent Labs    01/25/23 1639 01/26/23 0511 01/27/23 0503 01/28/23 0407  NA 139 136 141 137  K 4.8 4.9 4.8 4.3  CL 106 106 103 102  CO2 20* 22 30 26   GLUCOSE 146* 167* 114* 111*  BUN 17 26* 33* 25*  CALCIUM 8.7* 8.7* 8.9 8.5*  CREATININE 0.73  0.71 0.96 0.88 0.72  GFRNONAA >60  >60 >60 >60 >60    LIVER FUNCTION TESTS: Recent Labs    03/16/22 0942 12/06/22 1931 01/25/23 1639 01/26/23 0511 01/27/23 0503 01/28/23 0407  BILITOT 0.6 0.5  --   --   --   --   AST 22 24  --   --   --   --   ALT 17 16  --   --   --   --   ALKPHOS 59 63  --   --   --   --   PROT 6.6 6.5  --   --   --   --   ALBUMIN 3.8 4.0 3.7 3.4* 3.5 3.5     Assessment and Plan:  67 y.o. female outpatient. 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 Adrenal  carcinoma of unknown primary s/p right adrenal gland adrenalectomy. Recently found to have intra thoracic mediastinal subcarinal and left hilar adenopathy. Cytology shows malignancy however team is uncertain if it is metastatic breast cancer of lung primary. Team is requesting a portacath placement for chemotherapy.   CT chest from 9.27.24. Patient is on ASA 81 mg. Labs from 9.28.24 unremarkble. No pertinent allergies. Patient has been NPO since midnight.   Risks and benefits of image guided port-a-catheter placement was discussed with the patient including, but not limited to bleeding, infection, pneumothorax, or fibrin sheath development and need for additional procedures.  All of the patient's questions were answered, patient is agreeable to proceed. Consent signed and in chart.   Thank you for this interesting consult.  I greatly enjoyed meeting VIDA COLLMAN and look forward to participating in their care.  A copy of this report was sent to the requesting provider on this date.  Electronically Signed: Alene Mires, NP 02/06/2023, 10:49 PM   I spent a total of {New ZOXW:[id number]} {New Out-Pt:[id number]}  {Established Out-Pt:[id number]} in face to face in clinical consultation, greater than 50% of which was counseling/coordinating care for ***

## 2023-02-07 ENCOUNTER — Encounter: Payer: Self-pay | Admitting: Radiology

## 2023-02-07 ENCOUNTER — Ambulatory Visit
Admission: RE | Admit: 2023-02-07 | Discharge: 2023-02-07 | Disposition: A | Discharge status: Home / Self Care | Payer: Medicare HMO | Source: Ambulatory Visit | Attending: Oncology | Admitting: Oncology

## 2023-02-07 ENCOUNTER — Other Ambulatory Visit: Payer: Self-pay

## 2023-02-07 DIAGNOSIS — Z87891 Personal history of nicotine dependence: Secondary | ICD-10-CM | POA: Diagnosis not present

## 2023-02-07 DIAGNOSIS — Z171 Estrogen receptor negative status [ER-]: Secondary | ICD-10-CM | POA: Insufficient documentation

## 2023-02-07 DIAGNOSIS — C50511 Malignant neoplasm of lower-outer quadrant of right female breast: Secondary | ICD-10-CM | POA: Diagnosis not present

## 2023-02-07 DIAGNOSIS — F419 Anxiety disorder, unspecified: Secondary | ICD-10-CM | POA: Diagnosis not present

## 2023-02-07 DIAGNOSIS — K219 Gastro-esophageal reflux disease without esophagitis: Secondary | ICD-10-CM | POA: Diagnosis not present

## 2023-02-07 DIAGNOSIS — Z8521 Personal history of malignant neoplasm of larynx: Secondary | ICD-10-CM | POA: Insufficient documentation

## 2023-02-07 DIAGNOSIS — C50919 Malignant neoplasm of unspecified site of unspecified female breast: Secondary | ICD-10-CM | POA: Diagnosis not present

## 2023-02-07 HISTORY — PX: IR IMAGING GUIDED PORT INSERTION: IMG5740

## 2023-02-07 MED ORDER — LIDOCAINE-EPINEPHRINE 1 %-1:100000 IJ SOLN
10.0000 mL | Freq: Once | INTRAMUSCULAR | Status: AC
  Administered 2023-02-07: 10 mL via INTRADERMAL

## 2023-02-07 MED ORDER — HYDROCODONE-ACETAMINOPHEN 5-325 MG PO TABS
ORAL_TABLET | ORAL | Status: AC
  Filled 2023-02-07: qty 1

## 2023-02-07 MED ORDER — FENTANYL CITRATE (PF) 100 MCG/2ML IJ SOLN
INTRAMUSCULAR | Status: AC | PRN
Start: 2023-02-07 — End: 2023-02-07
  Administered 2023-02-07: 25 ug via INTRAVENOUS
  Administered 2023-02-07: 50 ug via INTRAVENOUS
  Administered 2023-02-07: 25 ug via INTRAVENOUS

## 2023-02-07 MED ORDER — LIDOCAINE-EPINEPHRINE 1 %-1:100000 IJ SOLN
INTRAMUSCULAR | Status: AC
  Filled 2023-02-07: qty 1

## 2023-02-07 MED ORDER — MIDAZOLAM HCL 2 MG/2ML IJ SOLN
INTRAMUSCULAR | Status: AC
  Filled 2023-02-07: qty 2

## 2023-02-07 MED ORDER — MIDAZOLAM HCL 2 MG/2ML IJ SOLN
INTRAMUSCULAR | Status: AC | PRN
Start: 2023-02-07 — End: 2023-02-07
  Administered 2023-02-07 (×2): 1 mg via INTRAVENOUS

## 2023-02-07 MED ORDER — HYDROCODONE-ACETAMINOPHEN 5-325 MG PO TABS
1.0000 | ORAL_TABLET | Freq: Once | ORAL | Status: AC
  Administered 2023-02-07: 1 via ORAL
  Filled 2023-02-07: qty 1

## 2023-02-07 MED ORDER — HEPARIN SOD (PORK) LOCK FLUSH 100 UNIT/ML IV SOLN
500.0000 [IU] | Freq: Once | INTRAVENOUS | Status: AC
  Administered 2023-02-07: 500 [IU] via INTRAVENOUS

## 2023-02-07 MED ORDER — HEPARIN SOD (PORK) LOCK FLUSH 100 UNIT/ML IV SOLN
INTRAVENOUS | Status: AC
  Filled 2023-02-07: qty 5

## 2023-02-07 MED ORDER — FENTANYL CITRATE (PF) 100 MCG/2ML IJ SOLN
INTRAMUSCULAR | Status: AC
  Filled 2023-02-07: qty 2

## 2023-02-07 MED ORDER — SODIUM CHLORIDE 0.9 % IV SOLN: INTRAVENOUS | Status: DC

## 2023-02-07 NOTE — H&P (Signed)
Chief Complaint: Patient was seen in consultation today for Image Guided Tunneled Central Venous Catheter with Port Placement at the request of Rao,Archana C  Supervising Physician: Pernell Dupre  Patient Status: [facility] - Out-pt  History of Present Illness: Denise Watts is a 67 y.o. female with medical history significant for anxiety, breast cancer, vocal cord cancer, and GERD. She currently is positive for a malignant neoplasm of the lower-outer quadrant of the right breast. The patient is in IR seen today for [facility] Catheter placement.  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   COPD (chronic obstructive pulmonary disease) (HCC)    Coronary artery disease    Cough 03/02/2017   INTERMTTENT DRY COUGH   Depression    Dyspnea    GERD (gastroesophageal reflux disease)    History of hiatal hernia    Personal history of chemotherapy    Personal history of radiation therapy    Skin cancer    Throat cancer (HCC) 1998   radiation   Wears dentures    full upper and lower    Past Surgical History:  Procedure Laterality Date   BIOPSY  01/05/2023   Procedure: BIOPSY;  Surgeon: Toney Reil, MD;  Location: [facility] ENDOSCOPY;  Service: Gastroenterology;;   BREAST BIOPSY Right 1997   positive   BREAST BIOPSY Right 11/15/2017   INVASIVE MAMMARY CARCINOMA triple negative   BREAST BIOPSY Right 07/20/2020   [place] bx, Q marker, neg   BREAST LUMPECTOMY Right 1997   2019 also   BREAST LUMPECTOMY WITH SENTINEL LYMPH NODE BIOPSY Right 12/08/2017   Procedure: BREAST LUMPECTOMY WITH SENTINEL LYMPH NODE BX;  Surgeon: Earline Mayotte, MD;  Location: [facility] ORS;  Service: General;  Laterality: Right;   BREAST REDUCTION WITH MASTOPEXY Left 06/10/2019   Procedure: Left  breast mastopexy reduction for symmetry;  Surgeon: Peggye Form, DO;  Location: [facility];  Service: Plastics;  Laterality: Left;  total 2.5 hours   CATARACT EXTRACTION W/PHACO Right 02/07/2022   Procedure: CATARACT EXTRACTION PHACO AND INTRAOCULAR LENS PLACEMENT (IOC) RIGHT DIABETIC;  Surgeon: Nevada Crane, MD;  Location: [facility];  Service: Ophthalmology;  Laterality: Right;  3.60 00:34.7   CATARACT EXTRACTION W/PHACO Left 02/28/2022   Procedure: CATARACT EXTRACTION PHACO AND INTRAOCULAR LENS PLACEMENT (IOC) LEFT DIABETIC 4.11 00:26.1;  Surgeon: Nevada Crane, MD;  Location: [facility];  Service: Ophthalmology;  Laterality: Left;   COLONOSCOPY  2015   COLONOSCOPY N/A 01/05/2023   Procedure: COLONOSCOPY;  Surgeon: Toney Reil, MD;  Location: [facility] ENDOSCOPY;  Service: Gastroenterology;  Laterality: N/A;   COLONOSCOPY WITH PROPOFOL N/A 10/29/2019   Procedure: COLONOSCOPY WITH PROPOFOL;  Surgeon: Midge Minium, MD;  Location: [facility] ENDOSCOPY;  Service: Endoscopy;  Laterality: N/A;   ESOPHAGOGASTRODUODENOSCOPY (EGD) WITH PROPOFOL N/A 05/24/2019   Procedure: ESOPHAGOGASTRODUODENOSCOPY (EGD) WITH PROPOFOL;  Surgeon: Midge Minium, MD;  Location: [facility] ENDOSCOPY;  Service: Endoscopy;  Laterality: N/A;   ESOPHAGOGASTRODUODENOSCOPY (EGD) WITH PROPOFOL N/A 09/06/2022   Procedure: ESOPHAGOGASTRODUODENOSCOPY (EGD) WITH PROPOFOL;  Surgeon: Midge Minium, MD;  Location: [facility] ENDOSCOPY;  Service: Endoscopy;  Laterality: N/A;   ESOPHAGOGASTRODUODENOSCOPY (EGD) WITH PROPOFOL  01/05/2023   Procedure: ESOPHAGOGASTRODUODENOSCOPY (EGD) WITH PROPOFOL;  Surgeon: Toney Reil,  MD;  Location: [facility] ENDOSCOPY;  Service: Gastroenterology;;   ESOPHAGUS SURGERY     KNEE SURGERY Right    LIPOSUCTION WITH LIPOFILLING Right 06/10/2019   Procedure: right breast scar and capsule release with fat grafting;  Surgeon: Peggye Form, DO;  Location: [facility];  Service: Plastics;  Laterality: Right;   POLYPECTOMY  01/05/2023   Procedure: POLYPECTOMY;  Surgeon: Toney Reil, MD;  Location: [facility] ENDOSCOPY;  Service: Gastroenterology;;   [facility] REMOVAL     PORTACATH PLACEMENT Right 01/17/2018   Procedure: INSERTION PORT-A-CATH- RIGHT;  Surgeon: Earline Mayotte, MD;  Location: [facility] ORS;  Service: General;  Laterality: Right;   ROBOTIC ADRENALECTOMY Right 12/28/2016   Procedure: ROBOTIC ADRENALECTOMY;  Surgeon: Vanna Scotland, MD;  Location: [facility] ORS;  Service: Urology;  Laterality: Right;   SKIN CANCER EXCISION     THROAT SURGERY  1998   throat cancer    TUBAL LIGATION     VENTRAL HERNIA REPAIR N/A 03/03/2017   Procedure: HERNIA REPAIR VENTRAL ADULT;  Surgeon: Ricarda Frame, MD;  Location: [facility] ORS;  Service: General;  Laterality: N/A;   VIDEO BRONCHOSCOPY WITH ENDOBRONCHIAL ULTRASOUND N/A 01/25/2023   Procedure: VIDEO BRONCHOSCOPY WITH ENDOBRONCHIAL ULTRASOUND;  Surgeon: Vida Rigger, MD;  Location: [facility] ORS;  Service: Thoracic;  Laterality: N/A;    Allergies: Sulfa antibiotics  Medications: Prior to Admission medications   Medication Sig Start Date End Date Taking? Authorizing Provider  aspirin EC 81 MG tablet Take 1 tablet (81 mg total) by mouth daily. Swallow whole. 12/08/21   Furth, Cadence H, PA-C  buPROPion (WELLBUTRIN XL) 150 MG 24 hr tablet Take 3 tablets (450 mg total) by mouth daily. 01/16/23   Jacky Kindle, FNP  busPIRone (BUSPAR) 5 MG tablet Take 1 tablet (5 mg total) by mouth 3 (three) times daily. 10/12/22   Jacky Kindle, FNP  cyclobenzaprine (FLEXERIL) 5 MG tablet TAKE 1 TABLET BY MOUTH AT BEDTIME 12/14/22   Jacky Kindle, FNP  DULoxetine (CYMBALTA) 60 MG capsule Take 1 capsule (60 mg total) by mouth 2 (two) times daily. 10/12/22   Jacky Kindle, FNP  meloxicam (MOBIC) 7.5 MG tablet TAKE 1 TABLET BY MOUTH DAILY 02/01/23   Merita Norton T, FNP  montelukast (SINGULAIR) 10 MG tablet Take 1 tablet (10 mg  total) by mouth at bedtime. 07/14/22   Bacigalupo, Marzella Schlein, MD  oxyCODONE (ROXICODONE) 5 MG immediate release tablet Take 1 tablet (5 mg total) by mouth every 8 (eight) hours as needed for severe pain or moderate pain. 02/03/23   Creig Hines, MD  rOPINIRole (REQUIP) 0.25 MG tablet TAKE 1 TABLET BY MOUTH AT BEDTIME 08/04/22   Jacky Kindle, FNP  rosuvastatin (CRESTOR) 40 MG tablet TAKE 1 TABLET BY MOUTH DAILY 01/31/23   Jacky Kindle, FNP     Family History  Problem Relation Age of Onset   Diabetes Mother    Heart attack Mother    Cancer Sister        cancer of eyes, spread to lung, other places. died at 66   Cancer Sister 80       unknown primary, spread to bone, brain died in 34's   Cancer Paternal Aunt        unk type   Cancer Paternal Grandmother        type unk   Prostate cancer Neg Hx    Kidney cancer Neg Hx    Bladder Cancer Neg Hx  Breast cancer Neg Hx     Social History   Socioeconomic History   Marital status: Married    Spouse name: Laree, Grigoryan (Spouse) [phone] ([place])   Number of children: 4   Years of education: Not on file   Highest education level: 10th grade  Occupational History   Occupation: retired  Tobacco Use   Smoking status: Former    Current packs/day: 0.00    Average packs/day: 1.5 packs/day for 30.0 years (45.0 ttl pk-yrs)    Types: Cigarettes    Start date: 05/02/1965    Quit date: 05/03/1995    Years since quitting: 27.7    Passive exposure: Past   Smokeless tobacco: Never  Vaping Use   Vaping status: Never Used  Substance and Sexual Activity   Alcohol use: Not Currently    Alcohol/week: 4.0 standard drinks of alcohol    Types: 4 Shots of liquor per week    Comment: none   Drug use: No   Sexual activity: Not Currently    Birth control/protection: Post-menopausal  Other Topics Concern   Not on file  Social History Narrative   Not on file   Social Determinants of Health   Financial Resource Strain: Low Risk  (12/02/2021)    Overall Financial Resource Strain (CARDIA)    Difficulty of Paying Living Expenses: Not very hard  Food Insecurity: No Food Insecurity (12/05/2022)   Hunger Vital Sign    Worried About Running Out of Food in the Last Year: Never true    Ran Out of Food in the Last Year: Never true  Transportation Needs: No Transportation Needs (12/05/2022)   PRAPARE - Administrator, Civil Service (Medical): No    Lack of Transportation (Non-Medical): No  Physical Activity: Insufficiently Active (12/05/2022)   Exercise Vital Sign    Days of Exercise per Week: 3 days    Minutes of Exercise per Session: 30 min  Stress: Stress Concern Present (12/05/2022)   [employer] of Occupational Health - Occupational Stress Questionnaire    Feeling of Stress : Rather much  Social Connections: Moderately Isolated (12/05/2022)   Social Connection and Isolation Panel [NHANES]    Frequency of Communication with Friends and Family: Three times a week    Frequency of Social Gatherings with Friends and Family: Twice a week    Attends Religious Services: Never    Database administrator or Organizations: No    Attends Engineer, structural: Never    Marital Status: Married    Review of Systems: A 12 point ROS discussed and pertinent positives are indicated in the HPI above.  All other systems are negative.  Review of Systems  Constitutional:  Positive for appetite change and fatigue. Negative for fever.  Respiratory:  Positive for cough. Negative for chest tightness, shortness of breath and wheezing.   Cardiovascular:  Negative for chest pain and leg swelling.  Gastrointestinal:  Positive for abdominal distention and nausea.       Heartburn  Musculoskeletal:  Negative for back pain.  Skin:  Negative for color change.  Neurological:  Positive for weakness. Negative for speech difficulty and headaches.  Psychiatric/Behavioral:  Negative for confusion. The patient is nervous/anxious.     Vital  Signs: BP 110/78   Pulse 83   Temp 98 F (36.7 C)   Resp 13   Ht 5\' 1"  (1.549 m)   Wt 125 lb (56.7 kg)   SpO2 99%   BMI 23.62 kg/m   Advance  Care Plan:  Full Code per patient   Physical Exam Constitutional:      Appearance: She is ill-appearing.  HENT:     Mouth/Throat:     Mouth: Mucous membranes are moist.  Cardiovascular:     Rate and Rhythm: Normal rate and regular rhythm.     Pulses: Normal pulses.     Heart sounds: Normal heart sounds.  Pulmonary:     Effort: Pulmonary effort is normal.     Breath sounds: Normal breath sounds. No wheezing.  Abdominal:     General: There is distension.     Palpations: Abdomen is soft.     Tenderness: There is no abdominal tenderness.  Musculoskeletal:        General: No swelling or tenderness.     Right lower leg: No edema.     Left lower leg: No edema.  Skin:    General: Skin is warm and dry.  Neurological:     Mental Status: She is alert and oriented to person, place, and time.  Psychiatric:        Mood and Affect: Mood normal.        Behavior: Behavior normal.     Imaging: DG Chest Port 1 View  Result Date: 01/28/2023 CLINICAL DATA:  Follow-up pneumothorax.  Left chest tube in place. EXAM: PORTABLE CHEST 1 VIEW COMPARISON:  01/27/2023 FINDINGS: Stable position of the left basilar chest tube. Small left apical pleural line is identified scratch set small left apical pneumothorax is again noted and appears unchanged compared with the previous exam. No pleural fluid, interstitial edema or airspace disease. Left midlung nodular opacity appears unchanged in the interval. IMPRESSION: 1. Stable position of left basilar chest tube. 2. Stable small left apical pneumothorax. 3. Unchanged left midlung nodular opacity. Electronically Signed   By: Signa Kell M.D.   On: 01/28/2023 08:51   DG Chest Port 1 View  Result Date: 01/28/2023 CLINICAL DATA:  Chest tube removal EXAM: PORTABLE CHEST 1 VIEW COMPARISON:  01/20/2023 FINDINGS:  Interval movable of LEFT chest tube. Trace LEFT apical pneumothorax measures 2-3 mm from the apical chest wall LEFT mid lung nodules again noted. IMPRESSION: Trace LEFT apical pneumothorax following chest tube removal. LEFT midlung nodules. These results will be called to the ordering clinician or representative by the Radiologist Assistant, and communication documented in the PACS or [employer]. Electronically Signed   By: Genevive Bi M.D.   On: 01/28/2023 08:42   ECHOCARDIOGRAM COMPLETE  Result Date: 01/27/2023    ECHOCARDIOGRAM REPORT   Patient Name:   Denise Watts Date of Exam: 01/27/2023 Medical Rec #:  [id number]    Height:       61.0 in Accession #:    [id number]   Weight:       137.1 lb Date of Birth:  18-Jan-1956    BSA:          1.609 m Patient Age:    67 years     BP:           99/71 mmHg Patient Gender: F            HR:           82 bpm. Exam Location:  [facility] Procedure: 2D Echo, Cardiac Doppler and Color Doppler Indications:     Atrial Fibrillation I48.91  History:         Patient has no prior history of Echocardiogram examinations.  CAD; Signs/Symptoms:Dyspnea.  Sonographer:     Neysa Bonito Roar Referring Phys:  [id number] Judithe Modest Diagnosing Phys: Debbe Odea MD  Sonographer Comments: Technically difficult study due to poor echo windows and Technically challenging study due to limited acoustic windows. Image acquisition challenging due to respiratory motion. IMPRESSIONS  1. Left ventricular ejection fraction, by estimation, is 50 to 55%. The left ventricle has low normal function. Left ventricular endocardial border not optimally defined to evaluate regional wall motion. Left ventricular diastolic parameters are consistent with Grade I diastolic dysfunction (impaired relaxation).  2. Right ventricular systolic function is low normal. The right ventricular size is not well visualized.  3. Right atrial size was mildly dilated.  4. The mitral valve is normal in structure.  Mild mitral valve regurgitation.  5. Tricuspid valve regurgitation is moderate.  6. The aortic valve was not well visualized. Aortic valve regurgitation is not visualized. FINDINGS  Left Ventricle: Left ventricular ejection fraction, by estimation, is 50 to 55%. The left ventricle has low normal function. Left ventricular endocardial border not optimally defined to evaluate regional wall motion. The left ventricular internal cavity  size was normal in size. There is no left ventricular hypertrophy. Left ventricular diastolic parameters are consistent with Grade I diastolic dysfunction (impaired relaxation). Right Ventricle: The right ventricular size is not well visualized. No increase in right ventricular wall thickness. Right ventricular systolic function is low normal. Left Atrium: Left atrial size was normal in size. Right Atrium: Right atrial size was mildly dilated. Pericardium: There is no evidence of pericardial effusion. Mitral Valve: The mitral valve is normal in structure. Mild mitral valve regurgitation. MV peak gradient, 2.4 mmHg. The mean mitral valve gradient is 1.0 mmHg. Tricuspid Valve: The tricuspid valve is normal in structure. Tricuspid valve regurgitation is moderate. Aortic Valve: The aortic valve was not well visualized. Aortic valve regurgitation is not visualized. Pulmonic Valve: The pulmonic valve was not well visualized. Pulmonic valve regurgitation is not visualized. Aorta: The aortic root is normal in size and structure. IAS/Shunts: No atrial level shunt detected by color flow Doppler.  LEFT VENTRICLE PLAX 2D LVIDd:         3.40 cm   Diastology LVIDs:         2.60 cm   LV e' medial:    8.16 cm/s LV PW:         0.70 cm   LV E/e' medial:  7.6 LV IVS:        1.00 cm   LV e' lateral:   10.10 cm/s LVOT diam:     1.60 cm   LV E/e' lateral: 6.1 LVOT Area:     2.01 cm  RIGHT VENTRICLE RV Basal diam:  3.60 cm RV Mid diam:    3.00 cm LEFT ATRIUM           Index        RIGHT ATRIUM           Index  LA diam:      3.20 cm 1.99 cm/m   RA Area:     15.80 cm LA Vol (A4C): 31.5 ml 19.58 ml/m  RA Volume:   38.20 ml  23.74 ml/m   AORTA Ao Root diam: 1.80 cm MITRAL VALVE               TRICUSPID VALVE MV Area (PHT): 4.49 cm    TR Peak grad:   26.4 mmHg MV Peak grad:  2.4 mmHg    TR Vmax:  257.00 cm/s MV Mean grad:  1.0 mmHg MV Vmax:       0.78 m/s    SHUNTS MV Vmean:      51.1 cm/s   Systemic Diam: 1.60 cm MV Decel Time: 169 msec MV E velocity: 61.80 cm/s MV A velocity: 74.40 cm/s MV E/A ratio:  0.83 MV A Prime:    12.0 cm/s Debbe Odea MD Electronically signed by Debbe Odea MD Signature Date/Time: 01/27/2023/5:08:46 PM    Final    CT CHEST WO CONTRAST  Result Date: 01/27/2023 CLINICAL DATA:  Status post left-sided lung biopsy 01/06/2023. Personal history of breast cancer. * Tracking Code: BO * EXAM: CT CHEST WITHOUT CONTRAST TECHNIQUE: Multidetector CT imaging of the chest was performed following the standard protocol without IV contrast. RADIATION DOSE REDUCTION: This exam was performed according to the departmental dose-optimization program which includes automated exposure control, adjustment of the mA and/or kV according to patient size and/or use of iterative reconstruction technique. COMPARISON:  PET-CT 12/19/2022.  CTA chest 12/06/2022 FINDINGS: Cardiovascular: The heart size is normal. No substantial pericardial effusion. Coronary artery calcification is evident. Mild atherosclerotic calcification is noted in the wall of the thoracic aorta. Mediastinum/Nodes: No mediastinal lymphadenopathy although multiple mediastinal and left hilar nodes are seen to be hypermetabolic on previous PET imaging. No evidence for gross hilar lymphadenopathy although assessment is limited by the lack of intravenous contrast on the current study. The esophagus has normal imaging features. There is no axillary lymphadenopathy. Lungs/Pleura: Stable 4 mm left upper lobe nodule on 28/4. 1.4 x 1.3 cm peripheral  left upper lobe nodule on 67/4 is similar to prior. 1.1 x 0.9 cm left lower lobe nodule on 63/4 is not substantially changed. Innumerable additional tiny 2-5 mm pulmonary nodules are seen distributed through both lungs. Adjacent 12 and 7 mm ground-glass opacities are seen along the fissure of the right lung on image 78/4, similar. Consolidation. No pleural effusion. No focal airspace Upper Abdomen: Small exophytic cyst noted upper pole left kidney. No followup imaging is recommended. Musculoskeletal: Fracture of the posterior right ninth rib with associated lucency raising the question of pathologic fracture. Surgical changes/fat necrosis noted right breast. IMPRESSION: 1. No substantial interval change in the appearance of the chest. 2. Several dominant pulmonary nodules are stable with innumerable bilateral tiny pulmonary nodules, similar to prior. 3. Fracture of the posterior right ninth rib with associated lucency raising the question of pathologic fracture. 4.  Aortic Atherosclerosis (ICD10-I70.0). Electronically Signed   By: Kennith Center M.D.   On: 01/27/2023 15:22   DG Chest Port 1 View  Result Date: 01/27/2023 CLINICAL DATA:  Left-sided chest pain EXAM: PORTABLE CHEST 1 VIEW COMPARISON:  Earlier same day FINDINGS: Cardiopericardial silhouette is at upper limits of normal for size. Left chest tube remains in place with continued further decrease in size of the apical left pneumothorax. Right lung clear. Nodular density left lower lung again noted. Telemetry leads overlie the chest. IMPRESSION: Continued further decrease in size of the apical left pneumothorax. Electronically Signed   By: Kennith Center M.D.   On: 01/27/2023 15:13   DG Chest Port 1 View  Result Date: 01/27/2023 CLINICAL DATA:  Follow-up pneumothorax. EXAM: PORTABLE CHEST 1 VIEW COMPARISON:  Chest radiograph 01/26/2023 FINDINGS: The cardiac silhouette is mildly enlarged. A left-sided chest tube remains in place, terminating over the  lateral left lung base. A small to moderate-sized left apical pneumothorax has mildly decreased in size. Lung volumes are lower than on the prior study  with increased patchy opacities in the left greater than right lung bases. Left lower lung nodules are again noted. No sizable pleural effusion is identified. IMPRESSION: 1. Mildly decreased size of left apical pneumothorax. 2. Decreased lung volumes with increased bibasilar atelectasis or infiltrates. Electronically Signed   By: Sebastian Ache M.D.   On: 01/27/2023 08:53   DG Chest Port 1 View  Result Date: 01/26/2023 CLINICAL DATA:  Chest tube EXAM: PORTABLE CHEST 1 VIEW COMPARISON:  01/26/2023, CT 01/23/2023 FINDINGS: Removal of endotracheal and esophageal tubes. Left lower chest drainage catheter with pigtail at the CP angle. Left lower lung nodule. Interval increase in size of left apical pneumothorax, now demonstrating 3.4 cm of pleural-parenchymal separation IMPRESSION: Interval increase in size of left apical pneumothorax now demonstrating up to 3.4 cm pleural-parenchymal separation at the apex. Left lower chest drainage catheter remains in place. These results will be called to the ordering clinician or representative by the Radiologist Assistant, and communication documented in the PACS or [employer]. Electronically Signed   By: Jasmine Pang M.D.   On: 01/26/2023 19:05   DG Chest Port 1 View  Result Date: 01/26/2023 CLINICAL DATA:  Pneumothorax EXAM: PORTABLE CHEST 1 VIEW COMPARISON:  Chest radiograph 1 day prior FINDINGS: The endotracheal tube tip is proximally 2.8 cm from the carina. The left basilar chest tube is stable. The enteric catheter tip is off the field of view but the side-hole projects over the stomach. Aeration of the left lung has significantly improved in the interim a 1.2 cm nodule projecting over the left midlung is noted, better seen on recent chest CT. There is no new or worsening focal airspace disease. There is no  significant effusion. There is a trace left apical pneumothorax. There is no significant right pneumothorax There is no acute osseous abnormality. IMPRESSION: 1. Trace left apical pneumothorax with chest tube in place. 2. Markedly improved aeration of the left lung. Electronically Signed   By: Lesia Hausen M.D.   On: 01/26/2023 09:12   DG Chest 1 View  Result Date: 01/25/2023 CLINICAL DATA:  [id number] Chest tube in place [id number] Encounter for imaging study to confirm orogastric (OG) tube placement [id number] EXAM: CHEST  1 VIEW; ABDOMEN - 1 VIEW COMPARISON:  Chest x-ray 01/25/2023 trauma CT chest 01/23/2023 FINDINGS: Endotracheal tube terminates 3.7 cm above the carina. Left chest tube pigtail overlies the lower left hemithorax. Enteric tube courses below the hemidiaphragm with tip and side port overlying the gastric lumen. The heart and mediastinal contours are unchanged. Atherosclerotic plaque. Patient is rotated on chest x-ray. Diffuse interstitial and patchy airspace opacities at the left lung. At least trace left pleural effusion. Resolution of left pneumothorax. No right pleural effusion pain no pneumothorax. Nonobstructive bowel gas pattern. No acute osseous abnormality. IMPRESSION: 1. Endotracheal and enteric tubes are in good position 2. Resolution of left pneumothorax. At least trace left pleural effusion. Left chest tube pigtail overlies the lower left hemithorax. 3. Diffuse interstitial and patchy airspace opacities at the left lung. Electronically Signed   By: Tish Frederickson M.D.   On: 01/25/2023 18:03   DG Abd 1 View  Result Date: 01/25/2023 CLINICAL DATA:  [id number] Chest tube in place [id number] Encounter for imaging study to confirm orogastric (OG) tube placement [id number] EXAM: CHEST  1 VIEW; ABDOMEN - 1 VIEW COMPARISON:  Chest x-ray 01/25/2023 trauma CT chest 01/23/2023 FINDINGS: Endotracheal tube terminates 3.7 cm above the carina. Left chest tube pigtail overlies the lower left  hemithorax.  Enteric tube courses below the hemidiaphragm with tip and side port overlying the gastric lumen. The heart and mediastinal contours are unchanged. Atherosclerotic plaque. Patient is rotated on chest x-ray. Diffuse interstitial and patchy airspace opacities at the left lung. At least trace left pleural effusion. Resolution of left pneumothorax. No right pleural effusion pain no pneumothorax. Nonobstructive bowel gas pattern. No acute osseous abnormality. IMPRESSION: 1. Endotracheal and enteric tubes are in good position 2. Resolution of left pneumothorax. At least trace left pleural effusion. Left chest tube pigtail overlies the lower left hemithorax. 3. Diffuse interstitial and patchy airspace opacities at the left lung. Electronically Signed   By: Tish Frederickson M.D.   On: 01/25/2023 18:03   DG Chest Port 1 View  Result Date: 01/25/2023 CLINICAL DATA:  Status post bronchoscopy EXAM: PORTABLE CHEST 1 VIEW COMPARISON:  Chest x-ray 01/25/2023. FINDINGS: Large left pneumothorax with rightward mediastinal shift. Opacities within the compressed left lung likely are in part due to compressive atelectasis. Known nodules are not well evaluated on this study. Right lung is clear. No visible pleural effusions. No acute bony abnormality. IMPRESSION: 1. Large left pneumothorax with rightward mediastinal shift. 2. The clinical team is aware of this finding given a chest tube has already been placed on a subsequent chest x-ray visible at this time. Electronically Signed   By: Feliberto Harts M.D.   On: 01/25/2023 16:42   DG C-Arm 1-60 Min-No Report  Result Date: 01/25/2023 Fluoroscopy was utilized by the requesting physician.  No radiographic interpretation.   DG C-Arm 1-60 Min-No Report  Result Date: 01/25/2023 Fluoroscopy was utilized by the requesting physician.  No radiographic interpretation.     Vitals:   02/07/23 1234 02/07/23 1312  BP: 110/78 (!) 129/57  Pulse: 83 81  Resp: 13 14  Temp:  98 F (36.7 C)   SpO2: 99% 98%    Labs:  CBC: Recent Labs    01/26/23 0511 01/27/23 0503 01/27/23 1331 01/28/23 0407  WBC 8.0 9.4 9.3 7.5  HGB 12.9 12.6 12.4 11.8*  HCT 40.1 39.2 39.2 37.6  PLT 165 156 150 139*    COAGS: Recent Labs    01/06/23 0821 01/23/23 1317  INR 1.0 1.0  APTT  --  26    BMP: Recent Labs    01/25/23 1639 01/26/23 0511 01/27/23 0503 01/28/23 0407  NA 139 136 141 137  K 4.8 4.9 4.8 4.3  CL 106 106 103 102  CO2 20* 22 30 26   GLUCOSE 146* 167* 114* 111*  BUN 17 26* 33* 25*  CALCIUM 8.7* 8.7* 8.9 8.5*  CREATININE 0.73  0.71 0.96 0.88 0.72  GFRNONAA >60  >60 >60 >60 >60    LIVER FUNCTION TESTS: Recent Labs    03/16/22 0942 12/06/22 1931 01/25/23 1639 01/26/23 0511 01/27/23 0503 01/28/23 0407  BILITOT 0.6 0.5  --   --   --   --   AST 22 24  --   --   --   --   ALT 17 16  --   --   --   --   ALKPHOS 59 63  --   --   --   --   PROT 6.6 6.5  --   --   --   --   ALBUMIN 3.8 4.0 3.7 3.4* 3.5 3.5       Assessment and Plan: Denise Watts is a 67 y.o. female with medical history significant for anxiety, breast cancer, vocal cord cancer, and  GERD. She currently is positive for a malignant neoplasm of the lower-outer quadrant of the right breast.   The patient is in IR seen today for [facility] Catheter placement.  Risks and benefits of image guided port-a-catheter placement was discussed with the patient including, but not limited to bleeding, infection, pneumothorax, or fibrin sheath development and need for additional procedures.  All of the patient's questions were answered, patient is agreeable to proceed. Consent signed and in chart.  Thank you for this interesting consult.  I greatly enjoyed meeting Denise Watts and look forward to participating in their care.  A copy of this report was sent to the requesting provider on this date.  Electronically Signed: Ardith Dark, NP 02/07/2023, 1:06 PM   I spent a total of    15 Minutes in  face to face in clinical consultation, greater than 50% of which was counseling/coordinating care for Image Guided Tunneled Central Venous Catheter with [facility] Placement

## 2023-02-07 NOTE — Procedures (Signed)
Interventional Radiology Procedure Note  Date of Procedure: 02/07/2023  Procedure: Port placement   Findings:  1. Right chest port placement. Tip at cavoatrial junction.    Complications: No immediate complications noted.   Estimated Blood Loss: minimal  Follow-up and Recommendations: 1. Ready for use.    Olive Bass, MD  Vascular & Interventional Radiology  02/07/2023 1:59 PM

## 2023-02-13 ENCOUNTER — Telehealth: Payer: Self-pay | Admitting: *Deleted

## 2023-02-13 ENCOUNTER — Encounter
Admission: RE | Admit: 2023-02-13 | Discharge: 2023-02-13 | Disposition: A | Discharge status: Home / Self Care | Payer: Medicare HMO | Source: Ambulatory Visit | Attending: Pulmonary Disease | Admitting: Pulmonary Disease

## 2023-02-13 ENCOUNTER — Telehealth: Payer: Self-pay | Admitting: Family Medicine

## 2023-02-13 ENCOUNTER — Encounter: Payer: Self-pay | Admitting: Urgent Care

## 2023-02-13 ENCOUNTER — Other Ambulatory Visit: Payer: Self-pay

## 2023-02-13 NOTE — Telephone Encounter (Signed)
-----   Message from Verlee Monte sent at 02/13/2023  4:05 PM EDT ----- Regarding: Request for pre-operative cardiac clearance Request for pre-operative cardiac clearance:  1. What type of surgery is being performed?  VIDEO BRONCHOSCOPY WITH ENDOBRONCHIAL ULTRASOUND  2. When is this surgery scheduled?  02/17/2023  3. Type of clearance being requested (medical, pharmacy, both)? MEDICAL   4. Are there any medications that need to be held prior to surgery? ASA  5. Practice name and name of physician performing surgery?  Performing surgeon: Dr. Vida Rigger, MD Requesting clearance: Quentin Mulling, FNP-C    6. Anesthesia type (none, local, MAC, general)? GENERAL  7. What is the office phone and fax number?   Fax: [phone]  ATTENTION: Unable to create telephone message as per your standard workflow. Directed by [facility] providers to send requests for cardiac clearance to this pool for appropriate distribution to provider covering pre-operative clearances.   Quentin Mulling, MSN, APRN, FNP-C, CEN [facility]  Peri-operative Services Nurse Practitioner Phone: [phone] 02/13/23 4:05 PM

## 2023-02-13 NOTE — Patient Instructions (Addendum)
Your procedure is scheduled on: 02/17/2023 Friday  Report to the Registration Desk on the 1st floor of the [facility]. To find out your arrival time, please call [phone] between 1PM - 3PM on: 02/16/2020 Thursday  If your arrival time is 6:00 am, do not arrive before that time as the [facility] entrance doors do not open until 6:00 am.  REMEMBER: Instructions that are not followed completely may result in serious medical risk, up to and including death; or upon the discretion of your surgeon and anesthesiologist your surgery may need to be rescheduled.  Do not eat food after midnight the night before surgery.  No gum chewing or hard candies.   One week prior to surgery: Stop Anti-inflammatories (NSAIDS) such as Advil, Aleve, Ibuprofen, Motrin, Naproxen, Naprosyn and Aspirin based products such as Excedrin, Goody's Powder, BC Powder. Stop ANY OVER THE COUNTER supplements until after surgery.  You may however, continue to take Tylenol if needed for pain up until the day of surgery.  Continue taking all of your other prescription medications up until the day of surgery except the following:     Aspirin- follow instructions given by your surgeon,last dose per Surgeon is 02/12/2023.  ON THE DAY OF SURGERY ONLY TAKE THESE MEDICATIONS WITH SIPS OF WATER:  buPROPion (WELLBUTRIN XL) DULoxetine (CYMBALTA) 3.   rosuvastatin (CRESTOR) Use inhalers on the day of surgery and bring to the hospital.  No Alcohol for 24 hours before or after surgery.  No Smoking including e-cigarettes for 24 hours before surgery.  No chewable tobacco products for at least 6 hours before surgery.  No nicotine patches on the day of surgery.  Do not use any "recreational" drugs for at least a week (preferably 2 weeks) before your surgery.  Please be advised that the combination of cocaine and anesthesia may have negative outcomes, up to and including death. If you test positive for cocaine, your surgery will  be cancelled.  On the morning of surgery brush your teeth with toothpaste and water, you may rinse your mouth with mouthwash if you wish. Do not swallow any toothpaste or mouthwash.  Use CHG Soap or wipes as directed on instruction sheet.  Do not wear jewelry, make-up, hairpins, clips or nail polish.  For welded (permanent) jewelry: bracelets, anklets, waist bands, etc.  Please have this removed prior to surgery.  If it is not removed, there is a chance that hospital personnel will need to cut it off on the day of surgery.  Do not wear lotions, powders, or perfumes.   Do not shave body hair from the neck down 48 hours before surgery.  Contact lenses, hearing aids and dentures may not be worn into surgery.  Do not bring valuables to the hospital. [facility] is not responsible for any missing/lost belongings or valuables.   Notify your doctor if there is any change in your medical condition (cold, fever, infection).  Wear comfortable clothing (specific to your surgery type) to the hospital.  After surgery, you can help prevent lung complications by doing breathing exercises.  Take deep breaths and cough every 1-2 hours. Your doctor may order a device called an Incentive Spirometer to help you take deep breaths. When coughing or sneezing, hold a pillow firmly against your incision with both hands. This is called "splinting." Doing this helps protect your incision. It also decreases belly discomfort.  If you are being admitted to the hospital overnight, leave your suitcase in the car. After surgery it may  be brought to your room.  If you are being discharged the day of surgery, you will not be allowed to drive home. You will need a responsible individual to drive you home and stay with you for 24 hours after surgery.    Please call the Pre-admissions Testing Dept. at [phone] if you have any questions about these instructions.  Surgery Visitation Policy:  Patients having  surgery or a procedure may have two visitors.  Children under the age of 54 must have an adult with them who is not the patient.

## 2023-02-13 NOTE — Telephone Encounter (Signed)
Pt has appt with Dr. Azucena Cecil 02/16/23.

## 2023-02-13 NOTE — Telephone Encounter (Signed)
Medication Refill - Medication: prilosec(doesn't know mg) not on med list but pt said she had this 2 weeks ago  Has the patient contacted their pharmacy? No/pt states she didn't have the bottle  Preferred Pharmacy (with phone number or street name): [facility] - [place], [place] - [address] Phone: [phone]  Fax: [phone]   Has the patient been seen for an appointment in the last year OR does the patient have an upcoming appointment? yes  Agent: Please be advised that RX refills may take up to 3 business days. We ask that you follow-up with your pharmacy.

## 2023-02-14 ENCOUNTER — Encounter: Payer: Self-pay | Admitting: Urgent Care

## 2023-02-14 ENCOUNTER — Telehealth (INDEPENDENT_AMBULATORY_CARE_PROVIDER_SITE_OTHER): Payer: Medicare HMO | Admitting: Family Medicine

## 2023-02-14 ENCOUNTER — Encounter: Payer: Self-pay | Admitting: Family Medicine

## 2023-02-14 DIAGNOSIS — K2100 Gastro-esophageal reflux disease with esophagitis, without bleeding: Secondary | ICD-10-CM

## 2023-02-14 MED ORDER — OMEPRAZOLE 20 MG PO CPDR: 20.0000 mg | DELAYED_RELEASE_CAPSULE | Freq: Two times a day (BID) | ORAL | 11 refills | Status: DC

## 2023-02-14 NOTE — Progress Notes (Signed)
MyChart Video Visit  Virtual Visit via Video Note   This format is felt to be most appropriate for this patient at this time. Physical exam was limited by quality of the video and audio technology used for the visit.   Patient location: Home Provider location:  [facility] [address]  [address] [place], [place] [address]  I discussed the limitations of evaluation and management by telemedicine and the availability of in person appointments. The patient expressed understanding and agreed to proceed.  Patient: Denise Watts   DOB: 12/11/1955   67 y.o. Female  MRN: [id number] Visit Date: 02/14/2023  Today's healthcare provider: Jacky Kindle, FNP   Introduced to nurse practitioner role and practice setting.  All questions answered.  Discussed provider/patient relationship and expectations.  Subjective    HPI  Complaints of worsening GERD symptoms   Medications: Outpatient Medications Prior to Visit  Medication Sig   aspirin EC 81 MG tablet Take 1 tablet (81 mg total) by mouth daily. Swallow whole.   benzonatate (TESSALON) 100 MG capsule Take 200 mg by mouth 3 (three) times daily as needed for cough.   buPROPion (WELLBUTRIN XL) 150 MG 24 hr tablet Take 3 tablets (450 mg total) by mouth daily.   busPIRone (BUSPAR) 5 MG tablet Take 1 tablet (5 mg total) by mouth 3 (three) times daily.   cyclobenzaprine (FLEXERIL) 5 MG tablet TAKE 1 TABLET BY MOUTH AT BEDTIME   DULoxetine (CYMBALTA) 60 MG capsule Take 1 capsule (60 mg total) by mouth 2 (two) times daily. (Patient taking differently: Take 60 mg by mouth daily.)   meloxicam (MOBIC) 7.5 MG tablet TAKE 1 TABLET BY MOUTH DAILY (Patient not taking: Reported on 02/13/2023)   montelukast (SINGULAIR) 10 MG tablet Take 1 tablet (10 mg total) by mouth at bedtime.   oxyCODONE (ROXICODONE) 5 MG immediate release tablet Take 1 tablet (5 mg total) by mouth every 8 (eight) hours as needed for severe pain or moderate pain.    rOPINIRole (REQUIP) 0.25 MG tablet TAKE 1 TABLET BY MOUTH AT BEDTIME   rosuvastatin (CRESTOR) 40 MG tablet TAKE 1 TABLET BY MOUTH DAILY   Facility-Administered Medications Prior to Visit  Medication Dose Route Frequency Provider   cyanocobalamin ((VITAMIN B-12)) injection 1,000 mcg  1,000 mcg Intramuscular Q30 days Creig Hines, MD   cyanocobalamin (VITAMIN B12) injection 1,000 mcg  1,000 mcg Intramuscular Q30 days Creig Hines, MD    Objective    There were no vitals taken for this visit.  Physical Exam  Patient Alert, Oriented, able to speak in full sentences.   Assessment & Plan     Problem List Items Addressed This Visit       Digestive   Gastroesophageal reflux disease with esophagitis without hemorrhage - Primary    Acute on chronic, worsening since discontinuation of medication Will restart BID dosing of prilosec to assist Recommend follow up as needed      No follow-ups on file.    I discussed the assessment and treatment plan with the patient. The patient was provided an opportunity to ask questions and all were answered. The patient agreed with the plan and demonstrated an understanding of the instructions.   The patient was advised to call back or seek an in-person evaluation if the symptoms worsen or if the condition fails to improve as anticipated.  I provided 10 minutes of face-to-face time during this encounter discussing GERD and treatment.  IJacky Kindle, FNP,  have reviewed all documentation for this visit. The documentation on 02/14/23 for the exam, diagnosis, procedures, and orders are all accurate and complete.  Jacky Kindle, FNP [facility] [phone] (phone) [phone] (fax)  [facility]

## 2023-02-14 NOTE — Assessment & Plan Note (Signed)
Acute on chronic, worsening since discontinuation of medication Will restart BID dosing of prilosec to assist Recommend follow up as needed

## 2023-02-14 NOTE — Progress Notes (Signed)
Perioperative / Anesthesia Services  Pre-Admission Testing Clinical Review / Pre-Operative Anesthesia Consult  Date: 02/16/23  Patient Demographics:  Name: Denise Watts DOB:   [birth year] MRN:   [id number]  Planned Surgical Procedure(s):    Case: [id number] Date/Time: 02/17/23 0900   Procedure: VIDEO BRONCHOSCOPY WITH ENDOBRONCHIAL ULTRASOUND   Anesthesia type: General   Pre-op diagnosis: R91.1   Location: [facility] PROCEDURE RM 02 / [facility]   Surgeons: Vida Rigger, MD     NOTE: Available PAT nursing documentation and vital signs have been reviewed. Clinical nursing staff has updated patient's PMH/PSHx, current medication list, and drug allergies/intolerances to ensure comprehensive history available to assist in medical decision making as it pertains to the aforementioned surgical procedure and anticipated anesthetic course. Extensive review of available clinical information personally performed. [place] PMH and PSHx updated with any diagnoses/procedures that  may have been inadvertently omitted during her intake with the pre-admission testing department's nursing staff.  Clinical Discussion:  Denise Watts is a 67 y.o. female who is submitted for pre-surgical anesthesia review and clearance prior to her undergoing the above procedure. Patient is a Former Smoker (45 pack years; quit 05/1995). Pertinent PMH includes: CAD, atrial fibrillation/flutter, diastolic dysfunction, aortic atherosclerosis, HLD, COPD, GERD (on daily PPI), hiatal hernia, remote breast cancer x 2, remote squamous cell carcinoma of vocal cord, remote adrenal carcinoma (s/p adrenalectomy), recent LEFT pneumothorax following biopsy, RLS, anxiety, depression.  Patient is followed by cardiology Azucena Cecil, MD). She was last seen in the cardiology clinic on 02/16/2023; notes reviewed. At the time of her clinic visit, patient with any complaints of chest discomfort and exertional dyspnea.  Patient denied  any  PND, orthopnea, palpitations, significant peripheral edema, weakness, fatigue, vertiginous symptoms, or presyncope/syncope. Patient with a past medical history significant for cardiovascular diagnoses. Documented physical exam was grossly benign, providing no evidence of acute exacerbation and/or decompensation of the patient's known cardiovascular conditions.  Myocardial perfusion imaging study was performed on 10/27/2020 revealing a normal left ventricular systolic function with an EF of 62%.  Sensitivity and specificity of the study degraded by significant extracardiac activity.  There was aortic atherosclerosis with no significant coronary artery calcification.  There was no evidence of stress-induced myocardial ischemia or arrhythmia; no scintigraphic evidence of scar.  Study determined to be normal and low risk.  Coronary CTA was performed on 12/09/2021 that demonstrated an Agatston coronary artery calcium score of 185. This placed patient in the 80th percentile for age, sex, and race matched controls. Calcium depositions noted to be isolated mainly in the proximal LAD and RCA (25-49%) distributions.  Study demonstrates normal coronary origin with RIGHT dominance.  Most recent TTE was performed on 01/27/2023 revealing a low normal left ventricular systolic function with EF of 50-55%.  Left ventricular endocardial border not optimally defined to evaluate for regional wall motion abnormalities. Left ventricular diastolic Doppler parameters consistent with abnormal relaxation (G1DD).  Right ventricular size not well-visualized.  RV SF low normal.  There was mild mitral, in addition to moderate tricuspid, valve regurgitation. All transvalvular gradients were noted to be normal providing no evidence suggestive of valvular stenosis. Aorta normal in size with no evidence of aneurysmal dilatation.  Blood pressure well controlled at 102/74 mmHg without the use of pharmacological interventions. Patient is  on rosuvastatin for her HLD diagnosis and ASCVD prevention. Patient is not diabetic. She does not have an OSAH diagnosis.  Functional capacity somewhat limited by patient's age and multiple medical comorbidities, including her  presumed pulmonary malignancy.  Patient is able to complete all of her ADLs/IADLs without significant cardiovascular limitation.  Per the [facility], patient is able to achieve at least 4 METS of physical activity without any significant degrees of angina/anginal equivalent symptoms. No changes were made to her medication regimen during her visit with cardiology.  Patient scheduled to follow-up with outpatient cardiology in 12 months or sooner if needed.  IRIDIAN HEINZMAN underwent CT imaging of the chest on 05/13/2022 that revealed evidence of multiple new BILATERAL pulmonary nodules.  Repeat CT angiography study was performed on 12/06/2022 again demonstrating multiple BILATERAL pulmonary nodules.  The largest nodule was noted in the LEFT upper pulmonary lobe with interval increase in size and density from prior exam now measuring 15 x 13 mm.  Findings at that time were suspicious for metastatic disease.  Patient also with linear lucency within the RIGHT posterior ninth rib.  Query pathologic rib fracture versus focal lesion.  Subsequent PET imaging was performed on 12/19/2022 revealing hypermetabolic mediastinal, subcarinal, and LEFT hilar adenopathy.  Index subcarinal lymph node measuring 5 mm with a maximum SUV of 6.5.  There was a 13 mm lingular nodule with a maximum SUV of 9.2.  Multiple scattered pulmonary nodules were observed to be non-FDG avid.  Focal hypermetabolism in the RIGHT ninth rib with a minimally displaced fracture noted; max SUV 6.3.  Findings consistent with intrathoracic nodal and pulmonary metastatic disease.  Hypermetabolic rib fracture concerning for metastasis.  Patient underwent CT guided biopsy on 01/06/2023, however her interventional radiologist (El-Abd, MD) was only  able to obtain minimal tissue despite multiple passes. Of additional concern, procedure had to be aborted due to patient experiencing excessive bleeding and hemoptysis. Patient referred to pulmonary medicine and was subsequently scheduled for a video bronchoscopy with endobronchial ultrasound on 01/25/2023.    Bronchoscopy procedure successfully completed without intraoperative complications.  While in PACU, patient became hypoxic requiring reintubation.  Imaging of the chest revealed development of a LEFT-sided pneumothorax.  Patient was transferred to the ICU.  Pneumothorax was treated with placement of a chest tube.  Pathology and cytology results from bronchoscopy positive for malignancy, however specimen was limited therefore a primary lung cancer versus metastatic breast cancer could not be ascertained.  Patient was seen in consult by medical oncology Smith Robert, MD); notes reviewed.  Plans were for repeat EBUS in efforts to determine primary source of malignancy.  Plans are to pursue NGS testing using peripheral blood if repeat bronchoscopy unrevealing.  Discussed probable plans for treatment using systemic antineoplastic chemotherapy (carboplatin + paclitaxel).  Patient is scheduled to undergo repeat VIDEO BRONCHOSCOPY WITH ENDOBRONCHIAL ULTRASOUND on 02/17/2023 with Dr. Vida Rigger, MD. Given her past medical history significant for cardiovascular diagnoses, presurgical cardiac clearance was sought by the PAT team.  Per cardiology, "echocardiogram with normal EF. Coronary CT 11/2021 showing mild nonobstructive disease. Okay to proceed with procedure from a cardiac perspective".  In review of her medication reconciliation, it is noted that patient is currently on prescribed daily antithrombotic therapy. She has been instructed on recommendations for holding her daily low-dose ASA for 5 days prior to her procedure with plans to restart as soon as postoperative bleeding risk felt to be minimized by her  attending surgeon. The patient has been instructed that her last dose of her aspirin should be on 02/12/2023.  Patient reports previous perioperative complications with anesthesia in the past.  Notes indicate that patient developed a postprocedural pneumothorax requiring reintubation in PACU following ENB/EBUS on 01/25/2023.  Patient underwent a general anesthetic course for this procedure (ASA III).      02/16/2023   10:55 AM 02/07/2023    2:44 PM 02/07/2023    2:30 PM  Vitals with BMI  Height 5\' 1"     Weight 128 lbs 3 oz    BMI 24.24    Systolic 102 95 93  Diastolic 74 66 78  Pulse 80 78 78    Providers/Specialists:   NOTE: Primary physician provider listed below. Patient may have been seen by APP or partner within same practice.   PROVIDER ROLE / SPECIALTY LAST Yolanda Manges, MD Pulmonary Medicine (Surgeon) 01/31/2023   Jacky Kindle, FNP Primary Care Provider 02/14/2023  Debbe Odea, MD Cardiology 02/16/2023  Owens Shark, MD Medical Oncology 02/01/2023   Allergies:  Sulfa antibiotics  Current Home Medications:   No current facility-administered medications for this encounter.    aspirin EC 81 MG tablet   benzonatate (TESSALON) 100 MG capsule   buPROPion (WELLBUTRIN XL) 150 MG 24 hr tablet   busPIRone (BUSPAR) 5 MG tablet   cyclobenzaprine (FLEXERIL) 5 MG tablet   DULoxetine (CYMBALTA) 60 MG capsule   meloxicam (MOBIC) 7.5 MG tablet   montelukast (SINGULAIR) 10 MG tablet   omeprazole (PRILOSEC) 20 MG capsule   oxyCODONE (ROXICODONE) 5 MG immediate release tablet   rOPINIRole (REQUIP) 0.25 MG tablet   rosuvastatin (CRESTOR) 40 MG tablet    cyanocobalamin ((VITAMIN B-12)) injection 1,000 mcg   cyanocobalamin (VITAMIN B12) injection 1,000 mcg   History:   Past Medical History:  Diagnosis Date   Adenomatous colon polyp    Adrenal carcinoma, right (HCC) 12/28/2016   a.) high grade carcinoma with extensive necrosis --> s/p adrenalectomy   Anxiety     Aortic atherosclerosis (HCC)    Arthritis of right knee    Atrial fibrillation and flutter (HCC)    a.) CHA2DS2-VASc = 3 (age, sex, vascular disease history) as of 02/13/2023; b.) cardiac rate/rhythm maintained intrinsically without pharmacological intervention; no chronic OAC (does take low dose ASA)   B12 deficiency    Breast cancer (HCC) 1997   a.) stage IB (cT1a, cN0, cM0, G3, ER+, PR-, HER2-); s/p lumpectomy + XRT   COPD (chronic obstructive pulmonary disease) (HCC)    Coronary artery disease    a.) cCTA 12/09/2021: Ca2+ = 135 (80th %'ile for age/sex/race match contol) --> 25-49% pLAD and RCA distributions   Depression    Diastolic dysfunction    a.) TTE 12/11/2020, no RWMAs, norm RVSF, G1DD; b.) TTE 12/14/2021: EF 55-60, no RWMAs, norm RVSF, G1DD; c.) TTE 01/27/2023: EF 50-55%, no RWMAs, G1DD, mild RAE, mild MR, mod TR   Dry cough 03/02/2017   Dyspnea    Full dentures    GERD (gastroesophageal reflux disease)    History of hiatal hernia    HLD (hyperlipidemia)    Invasive carcinoma of breast (HCC) 11/15/2017   a.) path (+) for G3, ER -, PR -, HER2/neu - ; s/p lumpectomy 01/17/2018 + 4 cycles TC systemic chemotherapy + XRT   Long term current use of aspirin    Personal history of chemotherapy    Personal history of radiation therapy    Pneumothorax of left lung after biopsy 01/25/2023   a.) hypoxic in PACU requiring re-intubation --> transferred to ICU --> chest tube placed by PCCM   RLS (restless legs syndrome)    a.) on ropinirole   Skin cancer    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   a.) Stage IB (cT1a, cN0, cM0, G3, ER+, PR-, HER2-)   Past Surgical History:  Procedure Laterality Date   BIOPSY  01/05/2023   Procedure: BIOPSY;  Surgeon: Toney Reil, MD;  Location: [facility] END[facility]COPY;  Service: Gastroenterology;;   BREAST BIOPSY Right 1997   positive   BREAST BIOPSY Right 11/15/2017   INVASIVE MAMMARY CARCINOMA triple negative   BREAST BIOPSY Right  07/20/2020   [place] bx, Q marker, neg   BREAST LUMPECTOMY Right 1997   2019 also   BREAST LUMPECTOMY WITH SENTINEL LYMPH NODE BIOPSY Right 12/08/2017   Procedure: BREAST LUMPECTOMY WITH SENTINEL LYMPH NODE BX;  Surgeon: Earline Mayotte, MD;  Location: [facility]S;  Service: General;  Laterality: Right;   BREAST REDUCTION WITH MASTOPEXY Left 06/10/2019   Procedure: Left breast mastopexy reduction for symmetry;  Surgeon: Peggye Form, DO;  Location: [facility];  Service: Plastics;  Laterality: Left;  total 2.5 hours   CATARACT EXTRACTION W/PHACO Right 02/07/2022   Procedure: CATARACT EXTRACTION PHACO AND INTRAOCULAR LENS PLACEMENT (IOC) RIGHT DIABETIC;  Surgeon: Nevada Crane, MD;  Location: [facility];  Service: Ophthalmology;  Laterality: Right;  3.60 00:34.7   CATARACT EXTRACTION W/PHACO Left 02/28/2022   Procedure: CATARACT EXTRACTION PHACO AND INTRAOCULAR LENS PLACEMENT (IOC) LEFT DIABETIC 4.11 00:26.1;  Surgeon: Nevada Crane, MD;  [facility];  Service: Ophthalmology;  Laterality: Left;   COLONOSCOPY  2015   COLONOSCOPY N/A 01/05/2023   Procedure: COLONOSCOPY;  Surgeon: Toney Reil, MD;  Location: [facility] ENDOSCOPY;  Service: Gastroenterology;  Laterality: N/A;   COLONOSCOPY WITH PROPOFOL N/A 10/29/2019   Procedure: COLONOSCOPY WITH PROPOFOL;  Surgeon: Midge Minium, MD;  Location: [facility] ENDOSCOPY;  Service: Endoscopy;  Laterality: N/A;   ESOPHAGOGASTRODUODENOSCOPY (EGD) WITH PROPOFOL N/A 05/24/2019   Procedure: ESOPHAGOGASTRODUODENOSCOPY (EGD) WITH PROPOFOL;  Surgeon: Midge Minium, MD;  Location: [facility] END[facility]COPY;  Service: Endoscopy;  Laterality: N/A;   ESOPHAGOGASTRODUODENOSCOPY (EGD) WITH PROPOFOL N/A 09/06/2022   Procedure: ESOPHAGOGASTRODUODENOSCOPY (EGD) WITH PROPOFOL;  Surgeon: Midge Minium, MD;  Location: [facility];  Service: Endoscopy;  Laterality: N/A;   ESOPHAGOGASTRODUODENOSCOPY (EGD) WITH PROPOFOL  01/05/2023    Procedure: ESOPHAGOGASTRODUODENOSCOPY (EGD) WITH PROPOFOL;  Surgeon: Toney Reil, MD;  Location: [facility];  Service: Gastroenterology;;   ESOPHAGUS SURGERY     IR IMAGING GUIDED PORT INSERTION  02/07/2023   KNEE SURGERY Right    LIPOSUCTION WITH LIPOFILLING Right 06/10/2019   Procedure: right breast scar and capsule release with fat grafting;  Surgeon: Peggye Form, DO;  Location: [facility];  Service: Plastics;  Laterality: Right;   POLYPECTOMY  01/05/2023   Procedure: POLYPECTOMY;  Surgeon: Toney Reil, MD;  Location: [facility];  Service: Gastroenterology;;   [facility] REMOVAL     PORTACATH PLACEMENT Right 01/17/2018   Procedure: INSERTION PORT-A-CATH- RIGHT;  Surgeon: Earline Mayotte, MD;  Location: [facility] ORS;  Service: General;  Laterality: Right;   ROBOTIC ADRENALECTOMY Right 12/28/2016   Procedure: ROBOTIC ADRENALECTOMY;  Surgeon: Vanna Scotland, MD;  Location: [facility];  Service: Urology;  Laterality: Right;   SKIN CANCER EXCISION     THROAT SURGERY  1998   throat cancer    TUBAL LIGATION     VENTRAL HERNIA REPAIR N/A 03/03/2017   Procedure: HERNIA REPAIR VENTRAL ADULT;  Surgeon: Ricarda Frame, MD;  Location: [facility] ORS;  Service: General;  Laterality: N/A;   VIDEO BRONCHOSCOPY WITH ENDOBRONCHIAL ULTRASOUND N/A 01/25/2023   Procedure: VIDEO BRONCHOSCOPY WITH  ENDOBRONCHIAL ULTRASOUND;  Surgeon: Vida Rigger, MD;  Location: [facility] ORS;  Service: Thoracic;  Laterality: N/A;   Family History  Problem Relation Age of Onset   Diabetes Mother    Heart attack Mother    Cancer Sister        cancer of eyes, spread to lung, other places. died at 77   Cancer Sister 30       unknown primary, spread to bone, brain died in 83's   Cancer Paternal Aunt        unk type   Cancer Paternal Grandmother        type unk   Prostate cancer Neg Hx    Kidney cancer Neg Hx    Bladder Cancer Neg Hx    Breast cancer Neg Hx    Social History    Tobacco Use   Smoking status: Former    Current packs/day: 0.00    Average packs/day: 1.5 packs/day for 30.0 years (45.0 ttl pk-yrs)    Types: Cigarettes    Start date: 05/02/1965    Quit date: 05/03/1995    Years since quitting: 27.8    Passive exposure: Past   Smokeless tobacco: Never  Vaping Use   Vaping status: Never Used  Substance Use Topics   Alcohol use: Not Currently    Alcohol/week: 4.0 standard drinks of alcohol    Types: 4 Shots of liquor per week    Comment: none   Drug use: No    Pertinent Clinical Results:  LABS:   Lab Results  Component Value Date   WBC 7.5 01/28/2023   HGB 11.8 (L) 01/28/2023   HCT 37.6 01/28/2023   MCV 101.9 (H) 01/28/2023   PLT 139 (L) 01/28/2023   Lab Results  Component Value Date   NA 137 01/28/2023   K 4.3 01/28/2023   CO2 26 01/28/2023   GLUCOSE 111 (H) 01/28/2023   BUN 25 (H) 01/28/2023   CREATININE 0.72 01/28/2023   CALCIUM 8.5 (L) 01/28/2023   EGFR 84 11/03/2021   GFRNONAA >60 01/28/2023    ECG: Date: 01/23/2023 Time ECG obtained: 1323 PM Rate: 88 bpm Rhythm: normal sinus Axis (leads I and aVF): Normal Intervals: PR 174 ms. QRS 74 ms. QTc 438 ms. ST segment and T wave changes: No evidence of acute ST segment elevation or depression.   Comparison: Similar to previous tracing obtained on 12/08/2021   IMAGING / PROCEDURES: DIAGNOSTIC RADIOGRAPHS OF CHEST PORTABLE 1 VIEW performed on 01/28/2023 Trace LEFT apical pneumothorax following chest tube removal. LEFT midlung nodules.   TRANSTHORACIC ECHOCARDIOGRAM performed on 01/26/2022 Left ventricular ejection fraction, by estimation, is 50 to 55%. The left ventricle has low normal function. Left ventricular endocardial border not optimally defined to evaluate regional wall motion. Left  ventricular diastolic parameters are consistent with Grade I diastolic dysfunction (impaired relaxation).  Right ventricular systolic function is low normal. The right ventricular size  is not well visualized.  Right atrial size was mildly dilated.  The mitral valve is normal in structure. Mild mitral valve regurgitation.  Tricuspid valve regurgitation is moderate.  The aortic valve was not well visualized. Aortic valve regurgitation is not visualized.   CT CHEST WO CONTRAST performed on 01/23/2023 No substantial interval change in the appearance of the chest. Several dominant pulmonary nodules are stable with innumerable bilateral tiny pulmonary nodules, similar to prior Fracture of the posterior right ninth rib with associated lucency raising the question of pathologic fracture Aortic atherosclerosis  NM PET IMAGE RESTAG (PS)  SKULL BASE TO THIGH performed on 12/19/2022 Intrathoracic nodal and pulmonary parenchymal metastatic disease. Hypermetabolic mediastinal, subcarinal and left hilar adenopathy. Index subcarinal lymph node measures 5 mm, SUV max 6.5. 13 mm lingular nodule (4/53), SUV max 9.2. Additional scattered pulmonary nodules are not show abnormal hypermetabolism, some of which are too small for PET resolution. Mild hypermetabolism associated with postoperative scarring in right breast. Hypermetabolic right ninth rib fracture, worrisome for a metastasis Aortic atherosclerosis  Coronary artery calcification  CT CORONARY MORPH W/CTA COR W/SCORE W/CA W/CM &/OR WO/CM performed on 12/09/2021 Coronary calcium score of 135. This was 80th percentile for age and sex matched control. Normal coronary origin with right dominance. Calcified plaque causing mild stenosis (25-49%) in the proximal LAD and RCA. CAD-RADS 2. Mild non-obstructive CAD (25-49%). Consider non-atherosclerotic causes of chest pain. Consider preventive therapy and risk factor modification. 1 mm left lower lobe pulmonary nodule as demonstrated on recent full chest CT. No mediastinal or hilar mass or adenopathy.  MYOCARDIAL PERFUSION IMAGING STUDY (LEXISCAN) performed on 10/27/2020 Normal pharmacologic  myocardial perfusion stress test without significant ischemia or scar. The left ventricular ejection fraction is normal (62%). Aortic atherosclerosis noted on the attenuation correction CT. There is no significant coronary artery calcification. Left lower lobe lung nodule measuring ~1 cm is stable compared with dedicated chest CT from 08/21/2020. This is a low risk study. Sensitivity and specificity of the study are degraded by significant extracardiac activity.  Impression and Plan:  NANCIE HEMBREE has been referred for pre-anesthesia review and clearance prior to her undergoing the planned anesthetic and procedural courses. Available labs, pertinent testing, and imaging results were personally reviewed by me in preparation for upcoming operative/procedural course. [facility] medical record has been updated following extensive record review and patient interview with PAT staff.   This patient has been appropriately cleared by cardiology with an overall ACCEPTABLE risk of experiencing significant perioperative cardiovascular complications. Based on clinical review performed today (02/16/23), barring any significant acute changes in the patient's overall condition, it is anticipated that she will be able to proceed with the planned surgical intervention. Any acute changes in clinical condition may necessitate her procedure being postponed and/or cancelled. Patient will meet with anesthesia team (MD and/or CRNA) on the day of her procedure for preoperative evaluation/assessment. Questions regarding anesthetic course will be fielded at that time.   Pre-surgical instructions were reviewed with the patient during her PAT appointment, and questions were fielded to satisfaction by PAT clinical staff. She has been instructed on which medications that she will need to hold prior to surgery, as well as the ones that have been deemed safe/appropriate to take on the day of her procedure. As part of the general  education provided by PAT, patient made aware both verbally and in writing, that she would need to abstain from the use of any illegal substances during her perioperative course.  She was advised that failure to follow the provided instructions could necessitate case cancellation or result in serious perioperative complications up to and including death. Patient encouraged to contact PAT and/or her surgeon's office to discuss any questions or concerns that may arise prior to surgery; verbalized understanding.   Quentin Mulling, MSN, APRN, FNP-C, CEN [facility]  Perioperative Services Nurse Practitioner Phone: [phone] Fax: [phone] 02/16/23 12:43 PM  NOTE: This note has been prepared using Dragon dictation software. Despite my best ability to proofread, there is always the potential that unintentional transcriptional errors may still occur from  this process.

## 2023-02-15 ENCOUNTER — Inpatient Hospital Stay: Payer: Medicare HMO

## 2023-02-15 DIAGNOSIS — Z8521 Personal history of malignant neoplasm of larynx: Secondary | ICD-10-CM | POA: Diagnosis not present

## 2023-02-15 DIAGNOSIS — Z808 Family history of malignant neoplasm of other organs or systems: Secondary | ICD-10-CM | POA: Diagnosis not present

## 2023-02-15 DIAGNOSIS — Z923 Personal history of irradiation: Secondary | ICD-10-CM | POA: Diagnosis not present

## 2023-02-15 DIAGNOSIS — Z87891 Personal history of nicotine dependence: Secondary | ICD-10-CM | POA: Diagnosis not present

## 2023-02-15 DIAGNOSIS — R590 Localized enlarged lymph nodes: Secondary | ICD-10-CM | POA: Diagnosis not present

## 2023-02-15 DIAGNOSIS — Z23 Encounter for immunization: Secondary | ICD-10-CM

## 2023-02-15 DIAGNOSIS — Z9221 Personal history of antineoplastic chemotherapy: Secondary | ICD-10-CM | POA: Diagnosis not present

## 2023-02-15 DIAGNOSIS — D519 Vitamin B12 deficiency anemia, unspecified: Secondary | ICD-10-CM | POA: Diagnosis not present

## 2023-02-15 DIAGNOSIS — Z853 Personal history of malignant neoplasm of breast: Secondary | ICD-10-CM | POA: Diagnosis not present

## 2023-02-15 DIAGNOSIS — E538 Deficiency of other specified B group vitamins: Secondary | ICD-10-CM

## 2023-02-15 DIAGNOSIS — C3492 Malignant neoplasm of unspecified part of left bronchus or lung: Secondary | ICD-10-CM | POA: Diagnosis not present

## 2023-02-15 MED ORDER — CYANOCOBALAMIN 1000 MCG/ML IJ SOLN
1000.0000 ug | INTRAMUSCULAR | Status: DC
  Administered 2023-02-15: 1000 ug via INTRAMUSCULAR
  Filled 2023-02-15: qty 1

## 2023-02-16 ENCOUNTER — Encounter: Payer: Self-pay | Admitting: Cardiology

## 2023-02-16 ENCOUNTER — Ambulatory Visit: Payer: Medicare HMO | Attending: Cardiology | Admitting: Cardiology

## 2023-02-16 ENCOUNTER — Encounter: Payer: Self-pay | Admitting: Urgent Care

## 2023-02-16 VITALS — BP 102/74 | HR 80 | Ht 61.0 in | Wt 128.2 lb

## 2023-02-16 DIAGNOSIS — Z01810 Encounter for preprocedural cardiovascular examination: Secondary | ICD-10-CM

## 2023-02-16 DIAGNOSIS — I2510 Atherosclerotic heart disease of native coronary artery without angina pectoris: Secondary | ICD-10-CM

## 2023-02-16 DIAGNOSIS — E785 Hyperlipidemia, unspecified: Secondary | ICD-10-CM

## 2023-02-16 NOTE — Patient Instructions (Signed)
Medication Instructions:  Your Physician recommend you continue on your current medication as directed.    *If you need a refill on your cardiac medications before your next appointment, please call your pharmacy*   Lab Work: None ordered If you have labs (blood work) drawn today and your tests are completely normal, you will receive your results only by: MyChart Message (if you have MyChart) OR A paper copy in the mail If you have any lab test that is abnormal or we need to change your treatment, we will call you to review the results.   Testing/Procedures: None ordered   Follow-Up: At [facility], you and your health needs are our priority.  As part of our continuing mission to provide you with exceptional heart care, we have created designated Provider Care Teams.  These Care Teams include your primary Cardiologist (physician) and Advanced Practice Providers (APPs -  Physician Assistants and Nurse Practitioners) who all work together to provide you with the care you need, when you need it.  We recommend signing up for the patient portal called "MyChart".  Sign up information is provided on this After Visit Summary.  MyChart is used to connect with patients for Virtual Visits (Telemedicine).  Patients are able to view lab/test results, encounter notes, upcoming appointments, etc.  Non-urgent messages can be sent to your provider as well.   To learn more about what you can do with MyChart, go to [email or web address].    Your next appointment:   1 year(s)  Provider:   You may see Debbe Odea, MD or one of the following Advanced Practice Providers on your designated Care Team:   Nicolasa Ducking, NP Eula Listen, PA-C Cadence Fransico Michael, PA-C Charlsie Quest, NP

## 2023-02-16 NOTE — Progress Notes (Signed)
Cardiology Office Note:    Date:  02/16/2023   ID:  SAELAH GNIADEK, DOB 1955-05-04, MRN [id number]  PCP:  Jacky Kindle, FNP   [facility] Providers Cardiologist:  Debbe Odea, MD     Referring MD: Jacky Kindle, FNP   Chief Complaint  Patient presents with   Hospitalization Follow-up    Needing cardiology clearance for scheduled bronchoscopy on 02/17/23.  Recently admitted due to complications during attempted biopsy.      History of Present Illness:    Denise Watts is a 67 y.o. female with a hx of nonobstructive CAD (mild LAD and RCA stenosis CCTA 8/23), GERD, breast cancer, SCC vocal cord s/p surgery and RT 1998, previous smoker x30+ years, COPD who presents for preop evaluation.  Patient is scheduled for a bronchoscopy to evaluate lung nodules.  Procedure was attempted last month 01/28/2023 but aborted due to pneumothorax and respiratory failure requiring intubation.  Echocardiogram obtained 01/27/2023 showed normal systolic function EF 50 to 55%, impaired relaxation.  Repeat bronchoscopy being planned tomorrow, patient is doing okay, denies chest pain.   Prior notes Coronary CT 11/2021, mild proximal LAD and RCA stenosis. Echo./2023 EF 55 to 60% She states having chest discomfort with exertion, also endorses shortness of breath.  Her mother passed from heart attack in her 27s.   Past Medical History:  Diagnosis Date   Adenomatous colon polyp    Adrenal carcinoma, right (HCC) 12/28/2016   a.) high grade carcinoma with extensive necrosis --> s/p adrenalectomy   Anxiety    Aortic atherosclerosis (HCC)    Arthritis of right knee    Atrial fibrillation and flutter (HCC)    a.) CHA2DS2-VASc = 3 (age, sex, vascular disease history) as of 02/13/2023; b.) cardiac rate/rhythm maintained intrinsically without pharmacological intervention; no chronic OAC (does take low dose ASA)   B12 deficiency    Breast cancer (HCC) 1997   a.) stage IB (cT1a, cN0, cM0, G3, ER+, PR-,  HER2-); s/p lumpectomy + XRT   COPD (chronic obstructive pulmonary disease) (HCC)    Coronary artery disease    a.) cCTA 12/09/2021: Ca2+ = 135 (80th %'ile for age/sex/race match contol) --> 25-49% pLAD and RCA distributions   Depression    Diastolic dysfunction    a.) TTE 12/11/2020, no RWMAs, norm RVSF, G1DD; b.) TTE 12/14/2021: EF 55-60, no RWMAs, norm RVSF, G1DD; c.) TTE 01/27/2023: EF 50-55%, no RWMAs, G1DD, mild RAE, mild MR, mod TR   Dry cough 03/02/2017   Dyspnea    Full dentures    GERD (gastroesophageal reflux disease)    History of hiatal hernia    Invasive carcinoma of breast (HCC) 11/15/2017   a.) path (+) for G3, ER -, PR -, HER2/neu - ; s/p lumpectomy 01/17/2018 + 4 cycles TC systemic chemotherapy + XRT   Long term current use of aspirin    Personal history of chemotherapy    Personal history of radiation therapy    Pneumothorax of left lung after biopsy 01/25/2023   a.) hypoxic in PACU requiring re-intubation --> transferred to ICU --> chest tube placed by PCCM   RLS (restless legs syndrome)    a.) on ropinirole   Skin cancer    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   a.) Stage IB (cT1a, cN0, cM0, G3, ER+, PR-, HER2-)    Past Surgical History:  Procedure Laterality Date   BIOPSY  01/05/2023   Procedure: BIOPSY;  Surgeon: Toney Reil, MD;  Location:  [facility];  Service: Gastroenterology;;   BREAST BIOPSY Right 1997   positive   BREAST BIOPSY Right 11/15/2017   INVASIVE MAMMARY CARCINOMA triple negative   BREAST BIOPSY Right 07/20/2020   [place] bx, Q marker, neg   BREAST LUMPECTOMY Right 1997   2019 also   BREAST LUMPECTOMY WITH SENTINEL LYMPH NODE BIOPSY Right 12/08/2017   Procedure: BREAST LUMPECTOMY WITH SENTINEL LYMPH NODE BX;  Surgeon: Earline Mayotte, MD;  Location: [facility] ORS;  Service: General;  Laterality: Right;   BREAST REDUCTION WITH MASTOPEXY Left 06/10/2019   Procedure: Left breast mastopexy reduction for symmetry;  Surgeon:  Peggye Form, DO;  Location: [facility];  Service: Plastics;  Laterality: Left;  total 2.5 hours   CATARACT EXTRACTION W/PHACO Right 02/07/2022   Procedure: CATARACT EXTRACTION PHACO AND INTRAOCULAR LENS PLACEMENT (IOC) RIGHT DIABETIC;  Surgeon: Nevada Crane, MD;  Location: [facility];  Service: Ophthalmology;  Laterality: Right;  3.60 00:34.7   CATARACT EXTRACTION W/PHACO Left 02/28/2022   Procedure: CATARACT EXTRACTION PHACO AND INTRAOCULAR LENS PLACEMENT (IOC) LEFT DIABETIC 4.11 00:26.1;  Surgeon: Nevada Crane, MD;  Location: [facility];  Service: Ophthalmology;  Laterality: Left;   COLONOSCOPY  2015   COLONOSCOPY N/A 01/05/2023   Procedure: COLONOSCOPY;  Surgeon: Toney Reil, MD;  Location: [facility];  Service: Gastroenterology;  Laterality: N/A;   COLONOSCOPY WITH PROPOFOL N/A 10/29/2019   Procedure: COLONOSCOPY WITH PROPOFOL;  Surgeon: Midge Minium, MD;  Location: [facility] ENDOSCOPY;  Service: Endoscopy;  Laterality: N/A;   ESOPHAGOGASTRODUODENOSCOPY (EGD) WITH PROPOFOL N/A 05/24/2019   Procedure: ESOPHAGOGASTRODUODENOSCOPY (EGD) WITH PROPOFOL;  Surgeon: Midge Minium, MD;  Location: [facility];  Service: Endoscopy;  Laterality: N/A;   ESOPHAGOGASTRODUODENOSCOPY (EGD) WITH PROPOFOL N/A 09/06/2022   Procedure: ESOPHAGOGASTRODUODENOSCOPY (EGD) WITH PROPOFOL;  Surgeon: Midge Minium, MD;  Location: [facility];  Service: Endoscopy;  Laterality: N/A;   ESOPHAGOGASTRODUODENOSCOPY (EGD) WITH PROPOFOL  01/05/2023   Procedure: ESOPHAGOGASTRODUODENOSCOPY (EGD) WITH PROPOFOL;  Surgeon: Toney Reil, MD;  Location: [facility];  Service: Gastroenterology;;   ESOPHAGUS SURGERY     IR IMAGING GUIDED PORT INSERTION  02/07/2023   KNEE SURGERY Right    LIPOSUCTION WITH LIPOFILLING Right 06/10/2019   Procedure: right breast scar and capsule release with fat grafting;  Surgeon: Peggye Form, DO;  Location: [facility];  Service: Plastics;  Laterality: Right;   POLYPECTOMY  01/05/2023   Procedure: POLYPECTOMY;  Surgeon: Toney Reil, MD;  Location: [facility] END[facility]COPY;  Service: Gastroenterology;;   [facility] REMOVAL     PORTACATH PLACEMENT Right 01/17/2018   Procedure: INSERTION PORT-A-CATH- RIGHT;  Surgeon: Earline Mayotte, MD;  Location: [facility];  Service: General;  Laterality: Right;   ROBOTIC ADRENALECTOMY Right 12/28/2016   Procedure: ROBOTIC ADRENALECTOMY;  Surgeon: Vanna Scotland, MD;  Location: [facility];  Service: Urology;  Laterality: Right;   SKIN CANCER EXCISION     THROAT SURGERY  1998   throat cancer    TUBAL LIGATION     VENTRAL HERNIA REPAIR N/A 03/03/2017   Procedure: HERNIA REPAIR VENTRAL ADULT;  Surgeon: Ricarda Frame, MD;  Location: [facility];  Service: General;  Laterality: N/A;   VIDEO BRONCHOSCOPY WITH ENDOBRONCHIAL ULTRASOUND N/A 01/25/2023   Procedure: VIDEO BRONCHOSCOPY WITH ENDOBRONCHIAL ULTRASOUND;  Surgeon: Vida Rigger, MD;  Location: [facility];  Service: Thoracic;  Laterality: N/A;    Current Medications: Current Meds  Medication Sig   aspirin EC 81 MG tablet Take 1 tablet (81 mg  total) by mouth daily. Swallow whole.   benzonatate (TESSALON) 100 MG capsule Take 200 mg by mouth 3 (three) times daily as needed for cough.   buPROPion (WELLBUTRIN XL) 150 MG 24 hr tablet Take 3 tablets (450 mg total) by mouth daily.   busPIRone (BUSPAR) 5 MG tablet Take 1 tablet (5 mg total) by mouth 3 (three) times daily.   cyclobenzaprine (FLEXERIL) 5 MG tablet TAKE 1 TABLET BY MOUTH AT BEDTIME   DULoxetine (CYMBALTA) 60 MG capsule Take 1 capsule (60 mg total) by mouth 2 (two) times daily. (Patient taking differently: Take 60 mg by mouth daily.)   montelukast (SINGULAIR) 10 MG tablet Take 1 tablet (10 mg total) by mouth at bedtime.   omeprazole (PRILOSEC) 20 MG capsule Take 1 capsule (20 mg total) by mouth 2 (two) times daily before a meal.   oxyCODONE (ROXICODONE) 5 MG  immediate release tablet Take 1 tablet (5 mg total) by mouth every 8 (eight) hours as needed for severe pain or moderate pain.   rOPINIRole (REQUIP) 0.25 MG tablet TAKE 1 TABLET BY MOUTH AT BEDTIME   rosuvastatin (CRESTOR) 40 MG tablet TAKE 1 TABLET BY MOUTH DAILY     Allergies:   Sulfa antibiotics   Social History   Socioeconomic History   Marital status: Married    Spouse name: Rubicela, Fero (Spouse) [phone] ([place])   Number of children: 4   Years of education: Not on file   Highest education level: 10th grade  Occupational History   Occupation: retired  Tobacco Use   Smoking status: Former    Current packs/day: 0.00    Average packs/day: 1.5 packs/day for 30.0 years (45.0 ttl pk-yrs)    Types: Cigarettes    Start date: 05/02/1965    Quit date: 05/03/1995    Years since quitting: 27.8    Passive exposure: Past   Smokeless tobacco: Never  Vaping Use   Vaping status: Never Used  Substance and Sexual Activity   Alcohol use: Not Currently    Alcohol/week: 4.0 standard drinks of alcohol    Types: 4 Shots of liquor per week    Comment: none   Drug use: No   Sexual activity: Not Currently    Birth control/protection: Post-menopausal  Other Topics Concern   Not on file  Social History Narrative   Not on file   Social Determinants of Health   Financial Resource Strain: Low Risk  (12/02/2021)   Overall Financial Resource Strain (CARDIA)    Difficulty of Paying Living Expenses: Not very hard  Food Insecurity: No Food Insecurity (12/05/2022)   Hunger Vital Sign    Worried About Running Out of Food in the Last Year: Never true    Ran Out of Food in the Last Year: Never true  Transportation Needs: No Transportation Needs (12/05/2022)   PRAPARE - Administrator, Civil Service (Medical): No    Lack of Transportation (Non-Medical): No  Physical Activity: Insufficiently Active (12/05/2022)   Exercise Vital Sign    Days of Exercise per Week: 3 days    Minutes of Exercise  per Session: 30 min  Stress: Stress Concern Present (12/05/2022)   [employer] of Occupational Health - Occupational Stress Questionnaire    Feeling of Stress : Rather much  Social Connections: Moderately Isolated (12/05/2022)   Social Connection and Isolation Panel [NHANES]    Frequency of Communication with Friends and Family: Three times a week    Frequency of Social Gatherings with Friends and Family:  Twice a week    Attends Religious Services: Never    Active Member of Clubs or Organizations: No    Attends Engineer, structural: Never    Marital Status: Married     Family History: The patient's family history includes Cancer in her paternal aunt, paternal grandmother, and sister; Cancer (age of onset: 83) in her sister; Diabetes in her mother; Heart attack in her mother. There is no history of Prostate cancer, Kidney cancer, Bladder Cancer, or Breast cancer.  ROS:   Please see the history of present illness.     All other systems reviewed and are negative.  EKGs/Labs/Other Studies Reviewed:    The following studies were reviewed today:   EKG Interpretation Date/Time:  Thursday February 16 2023 11:01:33 EDT Ventricular Rate:  80 PR Interval:  152 QRS Duration:  76 QT Interval:  386 QTC Calculation: 445 R Axis:   -13  Text Interpretation: Normal sinus rhythm Normal ECG Confirmed by Debbe Odea ([id number]) on 02/16/2023 11:12:43 AM    Recent Labs: 12/06/2022: ALT 16 01/26/2023: TSH 0.452 01/27/2023: Magnesium 2.3 01/28/2023: BUN 25; Creatinine, Ser 0.72; Hemoglobin 11.8; Platelets 139; Potassium 4.3; Sodium 137  Recent Lipid Panel    Component Value Date/Time   CHOL 135 05/26/2021 1101   TRIG 97 01/26/2023 0511   HDL 46 05/26/2021 1101   CHOLHDL 2.9 05/26/2021 1101   LDLCALC 68 05/26/2021 1101     Risk Assessment/Calculations:          Physical Exam:    VS:  BP 102/74 (BP Location: Left Arm, Patient Position: Sitting, Cuff Size: Normal)   Pulse  80   Ht 5\' 1"  (1.549 m)   Wt 128 lb 3.2 oz (58.2 kg)   SpO2 97%   BMI 24.22 kg/m     Wt Readings from Last 3 Encounters:  02/16/23 128 lb 3.2 oz (58.2 kg)  02/07/23 125 lb (56.7 kg)  02/01/23 127 lb (57.6 kg)     GEN:  Well nourished, well developed in no acute distress HEENT: Normal NECK: No JVD; No carotid bruits CARDIAC: RRR, no murmurs, rubs, gallops RESPIRATORY:  Clear to auscultation without wheezing. ABDOMEN: Soft, non-tender, non-distended MUSCULOSKELETAL:  No edema; No deformity  SKIN: Warm and dry NEUROLOGIC:  Alert and oriented x 3 PSYCHIATRIC:  Normal affect   ASSESSMENT:    1. Preoperative cardiovascular examination   2. Coronary artery disease involving native coronary artery of native heart without angina pectoris   3. Hyperlipidemia, unspecified hyperlipidemia type    PLAN:    In order of problems listed above:  Preprocedural exam, bronchoscopy being planned.  Echo with normal EF, coronary CT 8/23 showing mild nonobstructive disease.  Okay to proceed with procedure from a cardiac perspective. Mild Nonobstructive CAD involving the proximal LAD and RCA , calcium score 135.  Continue aspirin 81 mg, Crestor 40 mg daily.  EF 55%. Hyperlipidemia, LDL at goal.  Continue Crestor.   Follow-up in 12 months.  Medication Adjustments/Labs and Tests Ordered: Current medicines are reviewed at length with the patient today.  Concerns regarding medicines are outlined above.  Orders Placed This Encounter  Procedures   EKG 12-Lead     No orders of the defined types were placed in this encounter.     Patient Instructions  Medication Instructions:  Your Physician recommend you continue on your current medication as directed.    *If you need a refill on your cardiac medications before your next appointment, please call your pharmacy*  Lab Work: None ordered If you have labs (blood work) drawn today and your tests are completely normal, you will receive your  results only by: MyChart Message (if you have MyChart) OR A paper copy in the mail If you have any lab test that is abnormal or we need to change your treatment, we will call you to review the results.   Testing/Procedures: None ordered   Follow-Up: At [facility], you and your health needs are our priority.  As part of our continuing mission to provide you with exceptional heart care, we have created designated Provider Care Teams.  These Care Teams include your primary Cardiologist (physician) and Advanced Practice Providers (APPs -  Physician Assistants and Nurse Practitioners) who all work together to provide you with the care you need, when you need it.  We recommend signing up for the patient portal called "MyChart".  Sign up information is provided on this After Visit Summary.  MyChart is used to connect with patients for Virtual Visits (Telemedicine).  Patients are able to view lab/test results, encounter notes, upcoming appointments, etc.  Non-urgent messages can be sent to your provider as well.   To learn more about what you can do with MyChart, go to [email or web address].    Your next appointment:   1 year(s)  Provider:   You may see Debbe Odea, MD or one of the following Advanced Practice Providers on your designated Care Team:   Nicolasa Ducking, NP Eula Listen, PA-C Cadence Fransico Michael, PA-C Charlsie Quest, NP      Signed, Debbe Odea, MD  02/16/2023 12:06 PM    [facility]

## 2023-02-17 ENCOUNTER — Ambulatory Visit: Payer: Medicare HMO

## 2023-02-17 ENCOUNTER — Encounter
Admission: RE | Disposition: A | Discharge status: Home / Self Care | Payer: Self-pay | Source: Home / Self Care | Attending: Pulmonary Disease

## 2023-02-17 ENCOUNTER — Other Ambulatory Visit: Payer: Self-pay

## 2023-02-17 ENCOUNTER — Ambulatory Visit: Payer: Medicare HMO | Admitting: Urgent Care

## 2023-02-17 ENCOUNTER — Ambulatory Visit
Admission: RE | Admit: 2023-02-17 | Discharge: 2023-02-17 | Disposition: A | Discharge status: Home / Self Care | Payer: Medicare HMO | Attending: Pulmonary Disease | Admitting: Pulmonary Disease

## 2023-02-17 DIAGNOSIS — I4891 Unspecified atrial fibrillation: Secondary | ICD-10-CM | POA: Insufficient documentation

## 2023-02-17 DIAGNOSIS — Z87891 Personal history of nicotine dependence: Secondary | ICD-10-CM | POA: Insufficient documentation

## 2023-02-17 DIAGNOSIS — F329 Major depressive disorder, single episode, unspecified: Secondary | ICD-10-CM | POA: Insufficient documentation

## 2023-02-17 DIAGNOSIS — C3412 Malignant neoplasm of upper lobe, left bronchus or lung: Secondary | ICD-10-CM | POA: Insufficient documentation

## 2023-02-17 DIAGNOSIS — Z7982 Long term (current) use of aspirin: Secondary | ICD-10-CM | POA: Diagnosis not present

## 2023-02-17 DIAGNOSIS — I480 Paroxysmal atrial fibrillation: Secondary | ICD-10-CM | POA: Diagnosis not present

## 2023-02-17 DIAGNOSIS — Z853 Personal history of malignant neoplasm of breast: Secondary | ICD-10-CM | POA: Insufficient documentation

## 2023-02-17 DIAGNOSIS — R911 Solitary pulmonary nodule: Secondary | ICD-10-CM | POA: Diagnosis not present

## 2023-02-17 DIAGNOSIS — F419 Anxiety disorder, unspecified: Secondary | ICD-10-CM | POA: Diagnosis not present

## 2023-02-17 DIAGNOSIS — J449 Chronic obstructive pulmonary disease, unspecified: Secondary | ICD-10-CM | POA: Diagnosis not present

## 2023-02-17 DIAGNOSIS — E785 Hyperlipidemia, unspecified: Secondary | ICD-10-CM | POA: Diagnosis not present

## 2023-02-17 DIAGNOSIS — Z923 Personal history of irradiation: Secondary | ICD-10-CM | POA: Insufficient documentation

## 2023-02-17 DIAGNOSIS — C771 Secondary and unspecified malignant neoplasm of intrathoracic lymph nodes: Secondary | ICD-10-CM | POA: Diagnosis not present

## 2023-02-17 DIAGNOSIS — Z48813 Encounter for surgical aftercare following surgery on the respiratory system: Secondary | ICD-10-CM | POA: Diagnosis not present

## 2023-02-17 DIAGNOSIS — C801 Malignant (primary) neoplasm, unspecified: Secondary | ICD-10-CM | POA: Diagnosis not present

## 2023-02-17 DIAGNOSIS — I2510 Atherosclerotic heart disease of native coronary artery without angina pectoris: Secondary | ICD-10-CM | POA: Insufficient documentation

## 2023-02-17 DIAGNOSIS — K219 Gastro-esophageal reflux disease without esophagitis: Secondary | ICD-10-CM | POA: Diagnosis not present

## 2023-02-17 DIAGNOSIS — Z8521 Personal history of malignant neoplasm of larynx: Secondary | ICD-10-CM | POA: Diagnosis not present

## 2023-02-17 DIAGNOSIS — M1711 Unilateral primary osteoarthritis, right knee: Secondary | ICD-10-CM | POA: Insufficient documentation

## 2023-02-17 DIAGNOSIS — Z452 Encounter for adjustment and management of vascular access device: Secondary | ICD-10-CM | POA: Diagnosis not present

## 2023-02-17 DIAGNOSIS — R590 Localized enlarged lymph nodes: Secondary | ICD-10-CM | POA: Diagnosis not present

## 2023-02-17 HISTORY — DX: Atherosclerosis of aorta: I70.0

## 2023-02-17 HISTORY — PX: VIDEO BRONCHOSCOPY WITH ENDOBRONCHIAL ULTRASOUND: SHX6177

## 2023-02-17 HISTORY — DX: Hyperlipidemia, unspecified: E78.5

## 2023-02-17 HISTORY — DX: Unilateral primary osteoarthritis, right knee: M17.11

## 2023-02-17 HISTORY — DX: Restless legs syndrome: G25.81

## 2023-02-17 HISTORY — DX: Complete loss of teeth, unspecified cause, unspecified class: K08.109

## 2023-02-17 HISTORY — DX: Deficiency of other specified B group vitamins: E53.8

## 2023-02-17 HISTORY — DX: Benign neoplasm of colon, unspecified: D12.6

## 2023-02-17 HISTORY — DX: Complete loss of teeth, unspecified cause, unspecified class: Z97.2

## 2023-02-17 HISTORY — DX: Long term (current) use of aspirin: Z79.82

## 2023-02-17 HISTORY — DX: Other ill-defined heart diseases: I51.89

## 2023-02-17 HISTORY — DX: Unspecified atrial fibrillation: I48.91

## 2023-02-17 SURGERY — VIDEO BRONCHOSCOPY WITH ENDOBRONCHIAL ULTRASOUND
Anesthesia: General

## 2023-02-17 MED ORDER — GLYCOPYRROLATE 0.2 MG/ML IJ SOLN
INTRAMUSCULAR | Status: AC
  Filled 2023-02-17: qty 1

## 2023-02-17 MED ORDER — SUGAMMADEX SODIUM 200 MG/2ML IV SOLN
INTRAVENOUS | Status: DC | PRN
  Administered 2023-02-17: 232.8 mg via INTRAVENOUS

## 2023-02-17 MED ORDER — FENTANYL CITRATE (PF) 100 MCG/2ML IJ SOLN: 25.0000 ug | INTRAMUSCULAR | Status: DC | PRN

## 2023-02-17 MED ORDER — CHLORHEXIDINE GLUCONATE 0.12 % MT SOLN
15.0000 mL | Freq: Once | OROMUCOSAL | Status: AC
  Administered 2023-02-17: 15 mL via OROMUCOSAL

## 2023-02-17 MED ORDER — ONDANSETRON HCL 4 MG/2ML IJ SOLN
INTRAMUSCULAR | Status: AC
  Filled 2023-02-17: qty 2

## 2023-02-17 MED ORDER — LACTATED RINGERS IV SOLN: INTRAVENOUS | Status: DC

## 2023-02-17 MED ORDER — DEXAMETHASONE SODIUM PHOSPHATE 10 MG/ML IJ SOLN
INTRAMUSCULAR | Status: DC | PRN
  Administered 2023-02-17: 10 mg via INTRAVENOUS

## 2023-02-17 MED ORDER — ORAL CARE MOUTH RINSE: 15.0000 mL | Freq: Once | OROMUCOSAL | Status: AC

## 2023-02-17 MED ORDER — DROPERIDOL 2.5 MG/ML IJ SOLN
0.6250 mg | INTRAMUSCULAR | Status: AC
  Administered 2023-02-17: 0.625 mg via INTRAVENOUS

## 2023-02-17 MED ORDER — OXYCODONE HCL 5 MG/5ML PO SOLN: 5.0000 mg | Freq: Once | ORAL | Status: DC | PRN

## 2023-02-17 MED ORDER — SUCCINYLCHOLINE CHLORIDE 200 MG/10ML IV SOSY
PREFILLED_SYRINGE | INTRAVENOUS | Status: DC | PRN
  Administered 2023-02-17: 100 mg via INTRAVENOUS

## 2023-02-17 MED ORDER — ONDANSETRON HCL 4 MG/2ML IJ SOLN
INTRAMUSCULAR | Status: DC | PRN
  Administered 2023-02-17: 4 mg via INTRAVENOUS

## 2023-02-17 MED ORDER — FENTANYL CITRATE (PF) 100 MCG/2ML IJ SOLN
INTRAMUSCULAR | Status: AC
  Filled 2023-02-17: qty 2

## 2023-02-17 MED ORDER — ROCURONIUM BROMIDE 100 MG/10ML IV SOLN
INTRAVENOUS | Status: DC | PRN
  Administered 2023-02-17: 30 mg via INTRAVENOUS

## 2023-02-17 MED ORDER — PROPOFOL 10 MG/ML IV BOLUS
INTRAVENOUS | Status: AC
  Filled 2023-02-17: qty 20

## 2023-02-17 MED ORDER — MIDAZOLAM HCL 2 MG/2ML IJ SOLN
INTRAMUSCULAR | Status: DC | PRN
  Administered 2023-02-17: 2 mg via INTRAVENOUS

## 2023-02-17 MED ORDER — MIDAZOLAM HCL 2 MG/2ML IJ SOLN
INTRAMUSCULAR | Status: AC
  Filled 2023-02-17: qty 2

## 2023-02-17 MED ORDER — DROPERIDOL 2.5 MG/ML IJ SOLN
INTRAMUSCULAR | Status: AC
  Filled 2023-02-17: qty 2

## 2023-02-17 MED ORDER — CHLORHEXIDINE GLUCONATE 0.12 % MT SOLN
OROMUCOSAL | Status: AC
  Filled 2023-02-17: qty 15

## 2023-02-17 MED ORDER — PROPOFOL 10 MG/ML IV BOLUS
INTRAVENOUS | Status: DC | PRN
  Administered 2023-02-17: 100 mg via INTRAVENOUS
  Administered 2023-02-17: 20 mg via INTRAVENOUS

## 2023-02-17 MED ORDER — LIDOCAINE HCL (CARDIAC) PF 100 MG/5ML IV SOSY
PREFILLED_SYRINGE | INTRAVENOUS | Status: DC | PRN
  Administered 2023-02-17: 60 mg via INTRAVENOUS

## 2023-02-17 MED ORDER — FENTANYL CITRATE (PF) 100 MCG/2ML IJ SOLN
INTRAMUSCULAR | Status: DC | PRN
  Administered 2023-02-17 (×2): 25 ug via INTRAVENOUS

## 2023-02-17 MED ORDER — ROCURONIUM BROMIDE 10 MG/ML (PF) SYRINGE
PREFILLED_SYRINGE | INTRAVENOUS | Status: AC
  Filled 2023-02-17: qty 10

## 2023-02-17 MED ORDER — DEXAMETHASONE SODIUM PHOSPHATE 10 MG/ML IJ SOLN
INTRAMUSCULAR | Status: AC
  Filled 2023-02-17: qty 1

## 2023-02-17 MED ORDER — OXYCODONE HCL 5 MG PO TABS: 5.0000 mg | ORAL_TABLET | Freq: Once | ORAL | Status: DC | PRN

## 2023-02-17 NOTE — H&P (Signed)
PULMONOLOGY         Date: 02/17/2023,   MRN# [id number] Denise Watts Dec 11, 1955     AdmissionWeight: 58.2 kg                 CurrentWeight: 58.2 kg  Referring provider: Dr Smith Robert   CHIEF COMPLAINT:   Multiple lung nodules with hx of vocal cord cancer and breast cancer   HISTORY OF PRESENT ILLNESS   This is a pleasant 67 year old female with a history of anxiety disorder, right knee osteoarthritis, COPD, coronary artery disease, chronic cough, major depressive disorder, chronic dyspnea, GERD, history of hiatal hernia, history of vocal cord cancer and breast cancer status post medical oncology evaluation with full scope of therapy including radiation.  Recently noted to have lung nodules worse on the left with subsequent evaluation via PET scan noting hypermetabolic lesions of the left upper lobe as well as low avidity of left lower lobe lesion.    These lesions of left lung were biopsied and proven to be malignant with pathology showing below on 01/27/23 POSITIVE FOR MALIGNANCY.       - NON-SMALL CELL CARCINOMA IS PRESENT   During surgery there was a power outage and equipment had lost electricity so remainder of procedure was aborted.  Today patient is here to complete endobronchial US guided lymph node biopsies.  She has chronic cough no new complaints. Husband present, questions answered.  Reviewed risks/complications and benefits with patient, risks include infection, pneumothorax/pneumomediastinum which may require chest tube placement as well as overnight/prolonged hospitalization and possible mechanical ventilation. Other risks include bleeding and very rarely death.  Patient understands risks and wishes to proceed.  Additional questions were answered, and patient is aware that post procedure patient will be going home with family and may experience cough with possible clots on expectoration as well as phlegm which may last few days as well as hoarseness of voice post  intubation and mechanical ventilation.    PAST MEDICAL HISTORY   Past Medical History:  Diagnosis Date   Adenomatous colon polyp    Adrenal carcinoma, right (HCC) 12/28/2016   a.) high grade carcinoma with extensive necrosis --> s/p adrenalectomy   Anxiety    Aortic atherosclerosis (HCC)    Arthritis of right knee    Atrial fibrillation and flutter (HCC)    a.) CHA2DS2-VASc = 3 (age, sex, vascular disease history) as of 02/13/2023; b.) cardiac rate/rhythm maintained intrinsically without pharmacological intervention; no chronic OAC (does take low dose ASA)   B12 deficiency    Breast cancer (HCC) 1997   a.) stage IB (cT1a, cN0, cM0, G3, ER+, PR-, HER2-); s/p lumpectomy + XRT   COPD (chronic obstructive pulmonary disease) (HCC)    Coronary artery disease    a.) cCTA 12/09/2021: Ca2+ = 135 (80th %'ile for age/sex/race match contol) --> 25-49% pLAD and RCA distributions   Depression    Diastolic dysfunction    a.) TTE 12/11/2020, no RWMAs, norm RVSF, G1DD; b.) TTE 12/14/2021: EF 55-60, no RWMAs, norm RVSF, G1DD; c.) TTE 01/27/2023: EF 50-55%, no RWMAs, G1DD, mild RAE, mild MR, mod TR   Dry cough 03/02/2017   Dyspnea    Full dentures    GERD (gastroesophageal reflux disease)    History of hiatal hernia    HLD (hyperlipidemia)    Invasive carcinoma of breast (HCC) 11/15/2017   a.) path (+) for G3, ER -, PR -, HER2/neu - ; s/p lumpectomy 01/17/2018 + 4 cycles TC systemic chemotherapy +  XRT   Long term current use of aspirin    Personal history of chemotherapy    Personal history of radiation therapy    Pneumothorax of left lung after biopsy 01/25/2023   a.) hypoxic in PACU requiring re-intubation --> transferred to ICU --> chest tube placed by PCCM   RLS (restless legs syndrome)    a.) on ropinirole   Skin cancer    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   a.) Stage IB (cT1a, cN0, cM0, G3, ER+, PR-, HER2-)     SURGICAL HISTORY   Past Surgical History:  Procedure  Laterality Date   BIOPSY  01/05/2023   Procedure: BIOPSY;  Surgeon: Toney Reil, MD;  Location: [facility];  Service: Gastroenterology;;   BREAST BIOPSY Right 1997   positive   BREAST BIOPSY Right 11/15/2017   INVASIVE MAMMARY CARCINOMA triple negative   BREAST BIOPSY Right 07/20/2020   [place] bx, Q marker, neg   BREAST LUMPECTOMY Right 1997   2019 also   BREAST LUMPECTOMY WITH SENTINEL LYMPH NODE BIOPSY Right 12/08/2017   Procedure: BREAST LUMPECTOMY WITH SENTINEL LYMPH NODE BX;  Surgeon: Earline Mayotte, MD;  Location: [facility];  Service: General;  Laterality: Right;   BREAST REDUCTION WITH MASTOPEXY Left 06/10/2019   Procedure: Left breast mastopexy reduction for symmetry;  Surgeon: Peggye Form, DO;  Location: [facility];  Service: Plastics;  Laterality: Left;  total 2.5 hours   CATARACT EXTRACTION W/PHACO Right 02/07/2022   Procedure: CATARACT EXTRACTION PHACO AND INTRAOCULAR LENS PLACEMENT (IOC) RIGHT DIABETIC;  Surgeon: Nevada Crane, MD;  Location: [facility];  Service: Ophthalmology;  Laterality: Right;  3.60 00:34.7   CATARACT EXTRACTION W/PHACO Left 02/28/2022   Procedure: CATARACT EXTRACTION PHACO AND INTRAOCULAR LENS PLACEMENT (IOC) LEFT DIABETIC 4.11 00:26.1;  Surgeon: Nevada Crane, MD;  Location: [facility];  Service: Ophthalmology;  Laterality: Left;   COLONOSCOPY  2015   COLONOSCOPY N/A 01/05/2023   Procedure: COLONOSCOPY;  Surgeon: Toney Reil, MD;  Location: [facility];  Service: Gastroenterology;  Laterality: N/A;   COLONOSCOPY WITH PROPOFOL N/A 10/29/2019   Procedure: COLONOSCOPY WITH PROPOFOL;  Surgeon: Midge Minium, MD;  Location: [facility] END[facility]COPY;  Service: Endoscopy;  Laterality: N/A;   ESOPHAGOGASTRODUODENOSCOPY (EGD) WITH PROPOFOL N/A 05/24/2019   Procedure: ESOPHAGOGASTRODUODENOSCOPY (EGD) WITH PROPOFOL;  Surgeon: Midge Minium, MD;  Location: [facility];  Service: Endoscopy;  Laterality: N/A;    ESOPHAGOGASTRODUODENOSCOPY (EGD) WITH PROPOFOL N/A 09/06/2022   Procedure: ESOPHAGOGASTRODUODENOSCOPY (EGD) WITH PROPOFOL;  Surgeon: Midge Minium, MD;  Location: [facility];  Service: Endoscopy;  Laterality: N/A;   ESOPHAGOGASTRODUODENOSCOPY (EGD) WITH PROPOFOL  01/05/2023   Procedure: ESOPHAGOGASTRODUODENOSCOPY (EGD) WITH PROPOFOL;  Surgeon: Toney Reil, MD;  Location: [facility];  Service: Gastroenterology;;   ESOPHAGUS SURGERY     IR IMAGING GUIDED PORT INSERTION  02/07/2023   KNEE SURGERY Right    LIPOSUCTION WITH LIPOFILLING Right 06/10/2019   Procedure: right breast scar and capsule release with fat grafting;  Surgeon: Peggye Form, DO;  Location: [facility];  Service: Plastics;  Laterality: Right;   POLYPECTOMY  01/05/2023   Procedure: POLYPECTOMY;  Surgeon: Toney Reil, MD;  Location: [facility] END[facility];  Service: Gastroenterology;;   [facility] REMOVAL     PORTACATH PLACEMENT Right 01/17/2018   Procedure: INSERTION PORT-A-CATH- RIGHT;  Surgeon: Earline Mayotte, MD;  Location: [facility];  Service: General;  Laterality: Right;   ROBOTIC ADRENALECTOMY Right 12/28/2016   Procedure: ROBOTIC  ADRENALECTOMY;  Surgeon: Vanna Scotland, MD;  Location: [facility];  Service: Urology;  Laterality: Right;   SKIN CANCER EXCISION     THROAT SURGERY  1998   throat cancer    TUBAL LIGATION     VENTRAL HERNIA REPAIR N/A 03/03/2017   Procedure: HERNIA REPAIR VENTRAL ADULT;  Surgeon: Ricarda Frame, MD;  Location: [facility];  Service: General;  Laterality: N/A;   VIDEO BRONCHOSCOPY WITH ENDOBRONCHIAL ULTRASOUND N/A 01/25/2023   Procedure: VIDEO BRONCHOSCOPY WITH ENDOBRONCHIAL ULTRASOUND;  Surgeon: Vida Rigger, MD;  Location: [facility];  Service: Thoracic;  Laterality: N/A;     FAMILY HISTORY   Family History  Problem Relation Age of Onset   Diabetes Mother    Heart attack Mother    Cancer Sister        cancer of eyes, spread to lung, other places. died  at 41   Cancer Sister 32       unknown primary, spread to bone, brain died in 58's   Cancer Paternal Aunt        unk type   Cancer Paternal Grandmother        type unk   Prostate cancer Neg Hx    Kidney cancer Neg Hx    Bladder Cancer Neg Hx    Breast cancer Neg Hx      SOCIAL HISTORY   Social History   Tobacco Use   Smoking status: Former    Current packs/day: 0.00    Average packs/day: 1.5 packs/day for 30.0 years (45.0 ttl pk-yrs)    Types: Cigarettes    Start date: 05/02/1965    Quit date: 05/03/1995    Years since quitting: 27.8    Passive exposure: Past   Smokeless tobacco: Never  Vaping Use   Vaping status: Never Used  Substance Use Topics   Alcohol use: Not Currently    Alcohol/week: 4.0 standard drinks of alcohol    Types: 4 Shots of liquor per week    Comment: none   Drug use: No     MEDICATIONS    Home Medication:    Current Medication:  Current Facility-Administered Medications:    chlorhexidine (PERIDEX) 0.12 % solution 15 mL, 15 mL, Mouth/Throat, Once **OR** Oral care mouth rinse, 15 mL, Mouth Rinse, Once, Stephanie Coup, MD   lactated ringers infusion, , Intravenous, Continuous, Stephanie Coup, MD  Facility-Administered Medications Ordered in Other Encounters:    cyanocobalamin ((VITAMIN B-12)) injection 1,000 mcg, 1,000 mcg, Intramuscular, Q30 days, Creig Hines, MD, 1,000 mcg at 08/27/21 1537   cyanocobalamin (VITAMIN B12) injection 1,000 mcg, 1,000 mcg, Intramuscular, Q30 days, Creig Hines, MD, 1,000 mcg at 03/16/22 1101    ALLERGIES   Sulfa antibiotics     REVIEW OF SYSTEMS    Review of Systems:  Gen:  Denies  fever, sweats, chills weigh loss  HEENT: Denies blurred vision, double vision, ear pain, eye pain, hearing loss, nose bleeds, sore throat Cardiac:  No dizziness, chest pain or heaviness, chest tightness,edema Resp:   reports dyspnea chronically  Gi: Denies swallowing difficulty, stomach pain, nausea or vomiting,  diarrhea, constipation, bowel incontinence Gu:  Denies bladder incontinence, burning urine Ext:   Denies Joint pain, stiffness or swelling Skin: Denies  skin rash, easy bruising or bleeding or hives Endoc:  Denies polyuria, polydipsia , polyphagia or weight change Psych:   Denies depression, insomnia or hallucinations   Other:  All other systems negative   VS: BP 113/67   Pulse 82  Temp 98.1 F (36.7 C) (Temporal)   Resp 14   Ht 5\' 1"  (1.549 m)   Wt 58.2 kg   SpO2 99%   BMI 24.22 kg/m      PHYSICAL EXAM    GENERAL:NAD, no fevers, chills, no weakness no fatigue HEAD: Normocephalic, atraumatic.  EYES: Pupils equal, round, reactive to light. Extraocular muscles intact. No scleral icterus.  MOUTH: Moist mucosal membrane. Dentition intact. No abscess noted.  EAR, NOSE, THROAT: Clear without exudates. No external lesions.  NECK: Supple. No thyromegaly. No nodules. No JVD.  PULMONARY: decreased breath sounds with mild rhonchi worse at bases bilaterally.  CARDIOVASCULAR: S1 and S2. Regular rate and rhythm. No murmurs, rubs, or gallops. No edema. Pedal pulses 2+ bilaterally.  GASTROINTESTINAL: Soft, nontender, nondistended. No masses. Positive bowel sounds. No hepatosplenomegaly.  MUSCULOSKELETAL: No swelling, clubbing, or edema. Range of motion full in all extremities.  NEUROLOGIC: Cranial nerves II through XII are intact. No gross focal neurological deficits. Sensation intact. Reflexes intact.  SKIN: No ulceration, lesions, rashes, or cyanosis. Skin warm and dry. Turgor intact.  PSYCHIATRIC: Mood, affect within normal limits. The patient is awake, alert and oriented x 3. Insight, judgment intact.       IMAGING and LABS   Reviewed PET scan and CT chest from Sept 2024 POSITIVE FOR MALIGNANCY.       - NON-SMALL CELL CARCINOMA IS PRESENT     ASSESSMENT/PLAN   Multiple lung nodules with hilar adenopathy     - patient presents per request of medical oncology for biopsy of  hypermetabolic lesion of the left lung.  -s/p bronch with findings of [facility] and now needs ebus for lymph node staging   -Reviewed risks/complications and benefits with patient, risks include infection, pneumothorax/pneumomediastinum which may require chest tube placement as well as overnight/prolonged hospitalization and possible mechanical ventilation. Other risks include bleeding and very rarely death.  Patient understands risks and wishes to proceed.  Additional questions were answered, and patient is aware that post procedure patient will be going home with family and may experience cough with possible clots on expectoration as well as phlegm which may last few days as well as hoarseness of voice post intubation and mechanical ventilation.            Thank you for allowing me to participate in the care of this patient.   Patient/Family are satisfied with care plan and all questions have been answered.    Provider disclosure: Patient with at least one acute or chronic illness or injury that poses a threat to life or bodily function and is being managed actively during this encounter.  All of the below services have been performed independently by signing provider:  review of prior documentation from internal and or external health records.  Review of previous and current lab results.  Interview and comprehensive assessment during patient visit today. Review of current and previous chest radiographs/CT scans. Discussion of management and test interpretation with health care team and patient/family.   This document was prepared using Dragon voice recognition software and may include unintentional dictation errors.     Vida Rigger, M.D.  Division of Pulmonary & Critical Care Medicine

## 2023-02-17 NOTE — Anesthesia Procedure Notes (Signed)
Procedure Name: Intubation Date/Time: 02/17/2023 9:08 AM  Performed by: Stormy Fabian, CRNAPre-anesthesia Checklist: Patient identified, Patient being monitored, Timeout performed, Emergency Drugs available and Suction available Patient Re-evaluated:Patient Re-evaluated prior to induction Oxygen Delivery Method: Circle system utilized Preoxygenation: Pre-oxygenation with 100% oxygen Induction Type: IV induction Ventilation: Mask ventilation without difficulty Laryngoscope Size: Mac, 3 and McGraph Grade View: Grade I Tube type: Oral Tube size: 8.5 mm Number of attempts: 1 Airway Equipment and Method: Stylet Placement Confirmation: ETT inserted through vocal cords under direct vision, positive ETCO2 and breath sounds checked- equal and bilateral Secured at: 20 cm Tube secured with: Tape Dental Injury: Teeth and Oropharynx as per pre-operative assessment

## 2023-02-17 NOTE — Discharge Instructions (Signed)
AMBULATORY SURGERY  DISCHARGE INSTRUCTIONS   The drugs that you were given will stay in your system until tomorrow so for the next 24 hours you should not:  Drive an automobile Make any legal decisions Drink any alcoholic beverage   You may resume regular meals tomorrow.  Today it is better to start with liquids and gradually work up to solid foods.  You may eat anything you prefer, but it is better to start with liquids, then soup and crackers, and gradually work up to solid foods.   Please notify your doctor immediately if you have any unusual bleeding, trouble breathing, redness and pain at the surgery site, drainage, fever, or pain not relieved by medication.    Additional Instructions: 

## 2023-02-17 NOTE — Anesthesia Preprocedure Evaluation (Signed)
Anesthesia Evaluation  Patient identified by MRN, date of birth, ID band Patient awake    Reviewed: Allergy & Precautions, NPO status , Patient's Chart, lab work & pertinent test results  Airway Mallampati: III  TM Distance: >3 FB Neck ROM: full    Dental  (+) Upper Dentures, Lower Dentures   Pulmonary shortness of breath, COPD,  COPD inhaler, former smoker   Pulmonary exam normal        Cardiovascular (-) angina + CAD  Normal cardiovascular exam     Neuro/Psych  PSYCHIATRIC DISORDERS      negative neurological ROS     GI/Hepatic Neg liver ROS, hiatal hernia,GERD  Controlled,,  Endo/Other  negative endocrine ROS    Renal/GU      Musculoskeletal  (+) Arthritis ,    Abdominal   Peds  Hematology negative hematology ROS (+)   Anesthesia Other Findings Hoarse voice at baseline preOp  Past Medical History: No date: Anxiety No date: Arthritis     Comment:  KNEE RIGHT 1997: Breast cancer (HCC)     Comment:  right breast lumpectomy with radiation 11/15/2017: Breast cancer (HCC)     Comment:  INVASIVE MAMMARY CARCINOMA/ triple negative 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) No date: COPD (chronic obstructive pulmonary disease) (HCC) No date: Coronary artery disease 03/02/2017: Cough     Comment:  INTERMTTENT DRY COUGH No date: Depression No date: Dyspnea No date: GERD (gastroesophageal reflux disease) No date: History of hiatal hernia No date: Personal history of chemotherapy No date: Personal history of radiation therapy No date: Skin cancer 1998: Throat cancer (HCC)     Comment:  radiation No date: Wears dentures     Comment:  full upper and lower  Past Surgical History: 01/05/2023: BIOPSY     Comment:  Procedure: BIOPSY;  Surgeon: Toney Reil, MD;                Location: [facility];  Service: Gastroenterology;; 1997: BREAST BIOPSY; Right     Comment:  positive 11/15/2017: BREAST BIOPSY;  Right     Comment:  INVASIVE MAMMARY CARCINOMA triple negative 07/20/2020: BREAST BIOPSY; Right     Comment:  [place] bx, Q marker, neg 1997: BREAST LUMPECTOMY; Right     Comment:  2019 also 12/08/2017: BREAST LUMPECTOMY WITH SENTINEL LYMPH NODE BIOPSY; Right     Comment:  Procedure: BREAST LUMPECTOMY WITH SENTINEL LYMPH NODE               BX;  Surgeon: Earline Mayotte, MD;  Location: [facility]               [facility];  Service: General;  Laterality: Right; 06/10/2019: BREAST REDUCTION WITH MASTOPEXY; Left     Comment:  Procedure: Left breast mastopexy reduction for symmetry;              Surgeon: Peggye Form, DO;  Location: [facility];  Service: Plastics;  Laterality: Left;                total 2.5 hours 02/07/2022: CATARACT EXTRACTION W/PHACO; Right     Comment:  Procedure: CATARACT EXTRACTION PHACO AND INTRAOCULAR               LENS PLACEMENT (IOC) RIGHT DIABETIC;  Surgeon: Nevada Crane, MD;  Location: [facility];  Service: Ophthalmology;  Laterality: Right;                3.60 00:34.7 02/28/2022: CATARACT EXTRACTION W/PHACO; Left     Comment:  Procedure: CATARACT EXTRACTION PHACO AND INTRAOCULAR               LENS PLACEMENT (IOC) LEFT DIABETIC 4.11 00:26.1;                Surgeon: Nevada Crane, MD;  Location: [facility];  Service: Ophthalmology;  Laterality: Left; 2015: COLONOSCOPY 01/05/2023: COLONOSCOPY; N/A     Comment:  Procedure: COLONOSCOPY;  Surgeon: Toney Reil,               MD;  Location: [facility];  Service:               Gastroenterology;  Laterality: N/A; 10/29/2019: COLONOSCOPY WITH PROPOFOL; N/A     Comment:  Procedure: COLONOSCOPY WITH PROPOFOL;  Surgeon: Midge Minium, MD;  Location: [facility];  Service:               Endoscopy;  Laterality: N/A; 05/24/2019: ESOPHAGOGASTRODUODENOSCOPY (EGD) WITH PROPOFOL; N/A     Comment:  Procedure:  ESOPHAGOGASTRODUODENOSCOPY (EGD) WITH               PROPOFOL;  Surgeon: Midge Minium, MD;  Location: [facility]               ENDOSCOPY;  Service: Endoscopy;  Laterality: N/A; 09/06/2022: ESOPHAGOGASTRODUODENOSCOPY (EGD) WITH PROPOFOL; N/A     Comment:  Procedure: ESOPHAGOGASTRODUODENOSCOPY (EGD) WITH               PROPOFOL;  Surgeon: Midge Minium, MD;  Location: [facility]               ENDOS[facility];  Service: Endoscopy;  Laterality: N/A; 01/05/2023: ESOPHAGOGASTRODUODENOSCOPY (EGD) WITH PROPOFOL     Comment:  Procedure: ESOPHAGOGASTRODUODENOSCOPY (EGD) WITH               PROPOFOL;  Surgeon: Toney Reil, MD;  Location:               [facility];  Service: Gastroenterology;; No date: ESOPHAGUS SURGERY No date: KNEE SURGERY; Right 06/10/2019: LIPOSUCTION WITH LIPOFILLING; Right     Comment:  Procedure: right breast scar and capsule release with               fat grafting;  Surgeon: Peggye Form, DO;                Location: [facility];  Service: Plastics;               Laterality: Right; 01/05/2023: POLYPECTOMY     Comment:  Procedure: POLYPECTOMY;  Surgeon: Toney Reil,               MD;  Location: [facility];  Service:               Gastroenterology;; No date: PORT-A-CATH REMOVAL 01/17/2018: PORTACATH PLACEMENT; Right     Comment:  Procedure: INSERTION PORT-A-CATH- RIGHT;  Surgeon:               Earline Mayotte, MD;  Location: [facility];  Service:               General;  Laterality: Right;  12/28/2016: ROBOTIC ADRENALECTOMY; Right     Comment:  Procedure: ROBOTIC ADRENALECTOMY;  Surgeon: Vanna Scotland, MD;  Location: [facility];  Service: Urology;                Laterality: Right; No date: SKIN CANCER EXCISION 1998: THROAT SURGERY     Comment:  throat cancer  No date: TUBAL LIGATION 03/03/2017: VENTRAL HERNIA REPAIR; N/A     Comment:  Procedure: HERNIA REPAIR VENTRAL ADULT;  Surgeon:               Ricarda Frame, MD;  Location: [facility];   Service:               General;  Laterality: N/A;  BMI    Body Mass Index: 24.94 kg/m      Reproductive/Obstetrics negative OB ROS                             Anesthesia Physical Anesthesia Plan  ASA: 3  Anesthesia Plan: General ETT and General   Post-op Pain Management:    Induction: Intravenous  PONV Risk Score and Plan: Ondansetron, Dexamethasone, Midazolam and Treatment may vary due to age or medical condition  Airway Management Planned: Oral ETT  Additional Equipment:   Intra-op Plan:   Post-operative Plan: Extubation in OR  Informed Consent: I have reviewed the patients History and Physical, chart, labs and discussed the procedure including the risks, benefits and alternatives for the proposed anesthesia with the patient or authorized representative who has indicated his/her understanding and acceptance.     Dental Advisory Given  Plan Discussed with: Anesthesiologist, CRNA and Surgeon  Anesthesia Plan Comments: (Patient consented for risks of anesthesia including but not limited to:  - adverse reactions to medications - damage to eyes, teeth, lips or other oral mucosa - nerve damage due to positioning  - sore throat or hoarseness - Damage to heart, brain, nerves, lungs, other parts of body or loss of life  Patient voiced understanding.)       Anesthesia Quick Evaluation

## 2023-02-17 NOTE — Transfer of Care (Signed)
Immediate Anesthesia Transfer of Care Note  Patient: Denise Watts  Procedure(s) Performed: Procedure(s): VIDEO BRONCHOSCOPY WITH ENDOBRONCHIAL ULTRASOUND (N/A)  Patient Location: PACU  Anesthesia Type:General  Level of Consciousness: sedated  Airway & Oxygen Therapy: Patient Spontanous Breathing and Patient connected to face mask oxygen  Post-op Assessment: Report given to RN and Post -op Vital signs reviewed and stable  Post vital signs: Reviewed and stable  Last Vitals:  Vitals:   02/17/23 0836 02/17/23 1000  BP: 113/67 (!) 170/77  Pulse: 82 97  Resp: 14 18  Temp: 36.7 C   SpO2: 99% 100%    Complications: No apparent anesthesia complications

## 2023-02-17 NOTE — Procedures (Signed)
FLEXIBLE BRONCHOSCOPY WITH THERAPEUTIC ASPIRATION OF TRACHEOBRONCHIAL TREE AND BRONCHOALVEOLAR LAVAGE    FIBEROPTIC BRONCHOSCOPY WITH AIRWAY INSPECTION   ENDOBRONCHIAL ULTRASOUND WITH FNA X 2 LYMPH NODES   PROCEDURE NOTE  Flexible bronchoscopy was performed  by : Karna Christmas MD  assistance by : 1)Repiratory therapist  and 2)[employer] staff and 3) Anesthesia team and 4) Flouroscopy team    Indication for the procedure was :  Pre-procedural H&P. The following assessment was performed on the day of the procedure prior to initiating sedation History:  Chest pain n Dyspnea y Hemoptysis n Cough y Fever n Other pertinent items n  Examination Vital signs -reviewed as per nursing documentation today Cardiac    Murmurs: n  Rubs : n  Gallop: n Lungs Wheezing: n Rales : n Rhonchi :y  Other pertinent findings: SOB/hypoxemia due to chronic lung disease   Pre-procedural assessment for Procedural Sedation included: Depth of sedation: As per anesthesia team  ASA Classification:  2 Mallampati airway assessment: 3    Medication list reviewed: y  The patient's interval history was taken and revealed: no new complaints The pre- procedure physical examination revealed: No new findings Refer to prior clinic note for details.  Informed Consent: Informed consent was obtained from:  patient after explanation of procedure and risks, benefits, as well as alternative procedures available.  Explanation of level of sedation and possible transfusion was also provided.    Procedural Preparation: Time out was performed and patient was identified by name and birthdate and procedure to be performed and side for sampling, if any, was specified. Pt was intubated by anesthesia.  The patient was appropriately draped.   Fiberoptic bronchoscopy with airway inspection and BAL Procedure findings:  Bronchoscope was inserted via ETT  without difficulty.  Posterior oropharynx, epiglottis,  arytenoids, false cords and vocal cords were not visualized as these were bypassed by endotracheal tube. The distal trachea was normal in circumference and appearance without mucosal, cartilaginous or branching abnormalities.  The main carina was mildly splayed . All right and left lobar airways were visualized to the Subsegmental level.  Sub- sub segmental carinae were identified in all the distal airways.   Secretions were visible in the following airways and appeared to be clear.  The mucosa was : STENOTIC AND EDEMATOUS RIGHT LOWER LOBE SUPERIOR SEGMENT - BAL X 1 FOR CYTOLOGY PERFORMED AT THIS SEGMENT  Airways were notable for:        exophytic lesions :n       extrinsic compression in the following distributions: SUPERIOR SEGMENT OF RLL       Friable mucosa: y       Teacher, music /pigmentation: n   MUCUS PLUGGING BILATERALLY WAS ASPIRATED AT RUL, RML, RLL, LLL   Post procedure Diagnosis:   STENOTIC RLL SUPERIOR SEMGENT, MUCUS PLUGGING      Endobronchial ultrasound assisted hilar and mediastinal lymph node biopsies procedure findings: The fiberoptic bronchoscope was removed and the EBUS scope was introduced. Examination began to evaluate for pathologically enlarged lymph nodes starting on the RIGHT  side progressing to the LEFT side.  All lymph node biopsies performed with 21G needle. Lymph node biopsies were sent in cytolite for all stations.   Post procedure diagnosis:  LYMPHADENOPATHY SUSPICIOUS FOR METASTATIC IMPLANTS   Specimens obtained included:   Broncho-alveolar lavage site:RLL  sent for CYTOLOGY  20ml volume infused 6ml volume returned with CELLULAR AND CLOTTED  appearance  Fluoroscopy Used: NONE ;        Pictorial documentation attached: NONE                  Transbronchial WANG needle aspiration site: STATION 7 AND 10L  sent for: CYTOLOGY                                     Immediate sampling complications included:NONE   Epinephrine NONE ml was used topically  The bronchoscopy was terminated due to completion of the planned procedure and the bronchoscope was removed.   Total dosage of Lidocaine was NONE mg Total fluoroscopy time was NONE  minutes  Supplemental oxygen was provided at AS PER ANESTHESIA  lpm by nasal canula post operatively  Estimated Blood loss: <5cc EXPECTED .  Complications included:  NONE IMMEDIATE   Preliminary CXR findings :  IN PROCESS   Disposition: HOME WITH HUSBAND  Follow up with Dr. Karna Christmas in 5 days for result discussion.     Vida Rigger MD  [facility] Division of Pulmonary & Critical Care Medicine

## 2023-02-17 NOTE — Anesthesia Postprocedure Evaluation (Signed)
Anesthesia Post Note  Patient: Denise Watts  Procedure(s) Performed: VIDEO BRONCHOSCOPY WITH ENDOBRONCHIAL ULTRASOUND  Patient location during evaluation: PACU Anesthesia Type: General Level of consciousness: awake Pain management: satisfactory to patient Vital Signs Assessment: post-procedure vital signs reviewed and stable Respiratory status: spontaneous breathing and nonlabored ventilation Cardiovascular status: blood pressure returned to baseline Anesthetic complications: no   No notable events documented.   Last Vitals:  Vitals:   02/17/23 0836 02/17/23 1000  BP: 113/67 (!) 170/77  Pulse: 82 97  Resp: 14 18  Temp: 36.7 C   SpO2: 99% 100%    Last Pain:  Vitals:   02/17/23 0836  TempSrc: Temporal  PainSc: 4                  VAN STAVEREN,Aijalon Demuro

## 2023-02-19 NOTE — Plan of Care (Signed)
[facility] Tonsillectomy/Adenoidectomy, Postoperative PEDS care plan entered in error.

## 2023-02-21 LAB — CYTOLOGY - NON PAP

## 2023-02-24 ENCOUNTER — Inpatient Hospital Stay (HOSPITAL_BASED_OUTPATIENT_CLINIC_OR_DEPARTMENT_OTHER): Payer: Medicare HMO | Admitting: Oncology

## 2023-02-24 ENCOUNTER — Encounter: Payer: Self-pay | Admitting: Oncology

## 2023-02-24 VITALS — BP 115/75 | HR 96 | Temp 98.2°F | Resp 18 | Ht 61.0 in | Wt 126.6 lb

## 2023-02-24 DIAGNOSIS — Z808 Family history of malignant neoplasm of other organs or systems: Secondary | ICD-10-CM | POA: Diagnosis not present

## 2023-02-24 DIAGNOSIS — Z853 Personal history of malignant neoplasm of breast: Secondary | ICD-10-CM | POA: Diagnosis not present

## 2023-02-24 DIAGNOSIS — R590 Localized enlarged lymph nodes: Secondary | ICD-10-CM | POA: Diagnosis not present

## 2023-02-24 DIAGNOSIS — Z8521 Personal history of malignant neoplasm of larynx: Secondary | ICD-10-CM | POA: Diagnosis not present

## 2023-02-24 DIAGNOSIS — Z9221 Personal history of antineoplastic chemotherapy: Secondary | ICD-10-CM | POA: Diagnosis not present

## 2023-02-24 DIAGNOSIS — Z87891 Personal history of nicotine dependence: Secondary | ICD-10-CM | POA: Diagnosis not present

## 2023-02-24 DIAGNOSIS — D519 Vitamin B12 deficiency anemia, unspecified: Secondary | ICD-10-CM | POA: Diagnosis not present

## 2023-02-24 DIAGNOSIS — Z923 Personal history of irradiation: Secondary | ICD-10-CM | POA: Diagnosis not present

## 2023-02-24 DIAGNOSIS — C3492 Malignant neoplasm of unspecified part of left bronchus or lung: Secondary | ICD-10-CM | POA: Diagnosis not present

## 2023-02-24 DIAGNOSIS — Z7189 Other specified counseling: Secondary | ICD-10-CM

## 2023-02-26 ENCOUNTER — Encounter: Payer: Self-pay | Admitting: Oncology

## 2023-02-26 NOTE — Progress Notes (Signed)
Hematology/Oncology Consult note [facility]  [phone] [phone]  Patient Care Team: Jacky Kindle, FNP as PCP - General (Family Medicine) Debbe Odea, MD as PCP - Cardiology (Cardiology) Creig Hines, MD as Consulting Physician (Oncology) Leafy Ro, MD as Consulting Physician (General Surgery) Dasher, Cliffton Asters, MD (Dermatology) Scheeler, Kermit Balo, PA-C as Physician Assistant (Plastic Surgery) Minna Antis, MD as Attending Physician (Emergency Medicine) Alvira Philips Leitha Bleak (Neurology) Dayna Barker, MD as Referring Physician (Internal Medicine) Coralyn Helling, MD (Inactive) as Consulting Physician (Pulmonary Disease) Gaspar Cola, RPH (Inactive) (Pharmacist) Pa, [facility])   Name of the patient: Denise Watts  [id number]  [birth year]   Date of visit: 02/26/23  Diagnosis-  1. H/o breast cancer in 1997 2. H/o stage I SCC of vocal cord in 2015. 3. Lung nodules 4. Adrenal carcinoma of unknown primary s/p resection 5. Stage IaT1a NxcM0 ER weakly positive 1-10%, PR negative and her 2 negative     Chief complaint/ Reason for visit- discuss ebus results and further management  Heme/Onc history:  Oncology History Overview Note  1. Carcinoma of breast, T1, N0, M0 tumor diagnosis.  In January of 1997 2. Carcinoma of the vocal cord T1, N0, M0 tumor.  Status postradiation therapy 3. Abnormal CT scan of the chest (December, 2015) 4. Repeat CT scan of chest shows stable nodule (July, 2016) 5. Neutropenia with normal hemoglobin and normal platelet count (July, 2016)   Cancer of vocal cord [facility]) (Resolved)  11/10/2014 Initial Diagnosis   Cancer of vocal cord   Malignant neoplasm of lower-outer quadrant of right breast of female, estrogen receptor negative (HCC)  11/24/2017 Initial Diagnosis   Malignant neoplasm of lower-outer quadrant of right breast of female, estrogen receptor negative  (HCC)   01/12/2018 Cancer Staging   Staging form: Breast, AJCC 8th Edition - Clinical stage from 01/12/2018: Stage IB (cT1a, cN0, cM0, G3, ER+, PR-, HER2-) - Signed by Creig Hines, MD on 01/12/2018   01/30/2018 - 04/26/2018 Chemotherapy   The patient had dexamethasone (DECADRON) 4 MG tablet, 8 mg, Oral, 2 times daily, 1 of 1 cycle, Start date: 01/12/2018, End date: 08/03/2018 palonosetron (ALOXI) injection 0.25 mg, 0.25 mg, Intravenous,  Once, 4 of 4 cycles Administration: 0.25 mg (01/30/2018), 0.25 mg (02/27/2018), 0.25 mg (03/28/2018), 0.25 mg (04/26/2018) pegfilgrastim (NEULASTA ONPRO KIT) injection 6 mg, 6 mg, Subcutaneous, Once, 4 of 4 cycles Administration: 6 mg (01/30/2018), 6 mg (02/27/2018), 6 mg (03/28/2018), 6 mg (04/26/2018) cyclophosphamide (CYTOXAN) 1,000 mg in sodium chloride 0.9 % 250 mL chemo infusion, 600 mg/m2 = 1,000 mg, Intravenous,  Once, 4 of 4 cycles Administration: 1,000 mg (01/30/2018), 1,000 mg (02/27/2018), 1,000 mg (03/28/2018), 1,000 mg (04/26/2018) DOCEtaxel (TAXOTERE) 130 mg in sodium chloride 0.9 % 250 mL chemo infusion, 75 mg/m2 = 130 mg, Intravenous,  Once, 4 of 4 cycles Dose modification: 60 mg/m2 (original dose 75 mg/m2, Cycle 2, Reason: Dose not tolerated) Administration: 130 mg (01/30/2018), 100 mg (02/27/2018), 100 mg (03/28/2018), 100 mg (04/26/2018)  for chemotherapy treatment.      The patient was originally diagnosed with right breast cancer in 1997, 10 mm grade 3, ER pr positive her 2 negative, node negative status post surgery and radiation treatment only. She never received chemotherapy or hormone therapy. For her vocal cord cancer, she had extensive surgery and radiation therapy in 1998. Due to past history of smoking, she was also noted to have lung nodules. She is undergoing active  surveillance imaging study of the chest.   Patient noted to have adrenal mass on CT chest which prompted PET CT in July 2018 which showed: IMPRESSION: 1. Right adrenal  mass with peripheral hypermetabolism. Given interval development since 11/18/2015, suspicious for either isolated metastasis or a primary adrenal neoplasm. This should be considered for biopsy. 2. No evidence of hypermetabolic primary malignancy or extraadrenal metastasis. Pulmonary nodules are not significantly hypermetabolic. 3. Coronary artery atherosclerosis. Aortic Atherosclerosis (ICD10-I70.0). 4. Hepatic steatosis. 5. Left sixth rib hypermetabolism is favored to be related to remote Trauma.   Patient underwent right adrenalectomy which showed: DIAGNOSIS:  A. ADRENAL GLAND, RIGHT; ADRENALECTOMY:  - HIGH GRADE CARCINOMA WITH EXTENSIVE NECROSIS.  - MARGINS OF THE SPECIMEN ARE NEGATIVE FOR CARCINOMA.   Comment:  Sections demonstrate abundant necrosis confined to the adrenal medullary  compartment. In one block of tissue a small fragment of viable malignant  neoplasm was identified. A panel of immunohistochemical stains was  performed with the following pattern of immunoreactivity:  Super pancytokeratin: Positive  GATA-3: Negative  TTF-1: Negative  P40: Negative  Napsin: Negative  PAX-8: Negative  Chromogranin: Negative     Repeat CT chest abdomen and pelvis in July 2019 showed stable left lower lobe pulmonary nodule.  No other evidence of metastatic disease.  New 4.5 mm nodule in the right breast.  This was followed by ultrasound mammogram and core biopsy of the breast lesion which was a grade 3 triple negative breast cancer.  Patient was seen by Dr. Lemar Livings and underwent lumpectomy on 12/08/2017.   Patient had a 5 mm weakly ER positive tumor.  Given her ongoing fatigue Oncotype testing was done to see if she could potentially avoid chemotherapy.  Oncotype shows a recurrence score of 53 with her risk of distant recurrence at 9 years at greater than 39% with hormone therapy alone.  Absolute benefit of chemotherapy was greater than 15% in her age group.  Patient completed 4 cycles of  adjuvant TC chemotherapy in December 2019    Patient has known bilateral pulmonary nodules which have been monitored and she was again found to have a left lower lobe lung nodule measuring 13 mm unchanged as compared to prior exams.  There were also new subcentimeter lung nodules detected and follow-up CT was recommended.  CT angio chest in August 2024 showed increase in the size of left upper lobe lung nodule and new multiple pulmonary nodules concerning for metastatic disease.  This was followed by a PET CT scan which showed hypermetabolic intrathoracic nodal adenopathy measuring 5 to 13 mm in size along with lung nodules with a maximum size measuring 13 mm with an SUV of 9.2.   CT-guided biopsy was nondiagnostic she is patient underwent bronchoscopy but lymph nodes were not sampled at that time due to equipment malfunction.Bronchial lavage from left upper lobe was positive for malignancy non-small cell carcinoma.  But no further subclassification whether this is primary lung cancer or metastatic carcinoma could be done due to limited specimen. EBUS station 7 LN also shows non small cell carcinoma  Interval history- no acute issues since last visit. She does have chronic fatigue and generalized bodyaches  ECOG PS- 2 Pain scale- 4 Opioid associated constipation- no  Review of systems- Review of Systems  Constitutional:  Positive for malaise/fatigue. Negative for chills, fever and weight loss.  HENT:  Negative for congestion, ear discharge and nosebleeds.   Eyes:  Negative for blurred vision.  Respiratory:  Negative for cough, hemoptysis, sputum production,  shortness of breath and wheezing.   Cardiovascular:  Negative for chest pain, palpitations, orthopnea and claudication.  Gastrointestinal:  Negative for abdominal pain, blood in stool, constipation, diarrhea, heartburn, melena, nausea and vomiting.  Genitourinary:  Negative for dysuria, flank pain, frequency, hematuria and urgency.   Musculoskeletal:  Positive for myalgias. Negative for back pain and joint pain.  Skin:  Negative for rash.  Neurological:  Negative for dizziness, tingling, focal weakness, seizures, weakness and headaches.  Endo/Heme/Allergies:  Does not bruise/bleed easily.  Psychiatric/Behavioral:  Negative for depression and suicidal ideas. The patient does not have insomnia.       Allergies  Allergen Reactions   Sulfa Antibiotics Other (See Comments)    Mouth sore and raw     Past Medical History:  Diagnosis Date   Adenomatous colon polyp    Adrenal carcinoma, right (HCC) 12/28/2016   a.) high grade carcinoma with extensive necrosis --> s/p adrenalectomy   Anxiety    Aortic atherosclerosis (HCC)    Arthritis of right knee    Atrial fibrillation and flutter (HCC)    a.) CHA2DS2-VASc = 3 (age, sex, vascular disease history) as of 02/13/2023; b.) cardiac rate/rhythm maintained intrinsically without pharmacological intervention; no chronic OAC (does take low dose ASA)   B12 deficiency    Breast cancer (HCC) 1997   a.) stage IB (cT1a, cN0, cM0, G3, ER+, PR-, HER2-); s/p lumpectomy + XRT   COPD (chronic obstructive pulmonary disease) (HCC)    Coronary artery disease    a.) cCTA 12/09/2021: Ca2+ = 135 (80th %'ile for age/sex/race match contol) --> 25-49% pLAD and RCA distributions   Depression    Diastolic dysfunction    a.) TTE 12/11/2020, no RWMAs, norm RVSF, G1DD; b.) TTE 12/14/2021: EF 55-60, no RWMAs, norm RVSF, G1DD; c.) TTE 01/27/2023: EF 50-55%, no RWMAs, G1DD, mild RAE, mild MR, mod TR   Dry cough 03/02/2017   Dyspnea    Full dentures    GERD (gastroesophageal reflux disease)    History of hiatal hernia    HLD (hyperlipidemia)    Invasive carcinoma of breast (HCC) 11/15/2017   a.) path (+) for G3, ER -, PR -, HER2/neu - ; s/p lumpectomy 01/17/2018 + 4 cycles TC systemic chemotherapy + XRT   Long term current use of aspirin    Personal history of chemotherapy    Personal history of  radiation therapy    Pneumothorax of left lung after biopsy 01/25/2023   a.) hypoxic in PACU requiring re-intubation --> transferred to ICU --> chest tube placed by PCCM   RLS (restless legs syndrome)    a.) on ropinirole   Skin cancer    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   a.) Stage IB (cT1a, cN0, cM0, G3, ER+, PR-, HER2-)     Past Surgical History:  Procedure Laterality Date   BIOPSY  01/05/2023   Procedure: BIOPSY;  Surgeon: Toney Reil, MD;  Location: [facility];  Service: Gastroenterology;;   BREAST BIOPSY Right 1997   positive   BREAST BIOPSY Right 11/15/2017   INVASIVE MAMMARY CARCINOMA triple negative   BREAST BIOPSY Right 07/20/2020   [place] bx, Q marker, neg   BREAST LUMPECTOMY Right 1997   2019 also   BREAST LUMPECTOMY WITH SENTINEL LYMPH NODE BIOPSY Right 12/08/2017   Procedure: BREAST LUMPECTOMY WITH SENTINEL LYMPH NODE BX;  Surgeon: Earline Mayotte, MD;  Location: [facility] OR[facility];  Service: General;  Laterality: Right;   BREAST REDUCTION WITH MASTOPEXY Left 06/10/2019  Procedure: Left breast mastopexy reduction for symmetry;  Surgeon: Peggye Form, DO;  Location: [facility];  Service: Plastics;  Laterality: Left;  total 2.5 hours   CATARACT EXTRACTION W/PHACO Right 02/07/2022   Procedure: CATARACT EXTRACTION PHACO AND INTRAOCULAR LENS PLACEMENT (IOC) RIGHT DIABETIC;  Surgeon: Nevada Crane, MD;  Location: [facility];  Service: Ophthalmology;  Laterality: Right;  3.60 00:34.7   CATARACT EXTRACTION W/PHACO Left 02/28/2022   Procedure: CATARACT EXTRACTION PHACO AND INTRAOCULAR LENS PLACEMENT (IOC) LEFT DIABETIC 4.11 00:26.1;  Surgeon: Nevada Crane, MD;  Location: [facility];  Service: Ophthalmology;  Laterality: Left;   COLONOSCOPY  2015   COLONOSCOPY N/A 01/05/2023   Procedure: COLONOSCOPY;  Surgeon: Toney Reil, MD;  Location: [facility];  Service: Gastroenterology;  Laterality: N/A;    COLONOSCOPY WITH PROPOFOL N/A 10/29/2019   Procedure: COLONOSCOPY WITH PROPOFOL;  Surgeon: Midge Minium, MD;  Location: [facility] END[facility]COPY;  Service: Endoscopy;  Laterality: N/A;   ESOPHAGOGASTRODUODENOSCOPY (EGD) WITH PROPOFOL N/A 05/24/2019   Procedure: ESOPHAGOGASTRODUODENOSCOPY (EGD) WITH PROPOFOL;  Surgeon: Midge Minium, MD;  Location: [facility] ENDOSCOPY;  Service: Endoscopy;  Laterality: N/A;   ESOPHAGOGASTRODUODENOSCOPY (EGD) WITH PROPOFOL N/A 09/06/2022   Procedure: ESOPHAGOGASTRODUODENOSCOPY (EGD) WITH PROPOFOL;  Surgeon: Midge Minium, MD;  Location: [facility];  Service: Endoscopy;  Laterality: N/A;   ESOPHAGOGASTRODUODENOSCOPY (EGD) WITH PROPOFOL  01/05/2023   Procedure: ESOPHAGOGASTRODUODENOSCOPY (EGD) WITH PROPOFOL;  Surgeon: Toney Reil, MD;  Location: [facility];  Service: Gastroenterology;;   ESOPHAGUS SURGERY     IR IMAGING GUIDED PORT INSERTION  02/07/2023   KNEE SURGERY Right    LIPOSUCTION WITH LIPOFILLING Right 06/10/2019   Procedure: right breast scar and capsule release with fat grafting;  Surgeon: Peggye Form, DO;  Location: [facility];  Service: Plastics;  Laterality: Right;   POLYPECTOMY  01/05/2023   Procedure: POLYPECTOMY;  Surgeon: Toney Reil, MD;  Location: [facility] END[facility];  Service: Gastroenterology;;   [facility] REMOVAL     PORTACATH PLACEMENT Right 01/17/2018   Procedure: INSERTION PORT-A-CATH- RIGHT;  Surgeon: Earline Mayotte, MD;  Location: [facility];  Service: General;  Laterality: Right;   ROBOTIC ADRENALECTOMY Right 12/28/2016   Procedure: ROBOTIC ADRENALECTOMY;  Surgeon: Vanna Scotland, MD;  Location: [facility];  Service: Urology;  Laterality: Right;   SKIN CANCER EXCISION     THROAT SURGERY  1998   throat cancer    TUBAL LIGATION     VENTRAL HERNIA REPAIR N/A 03/03/2017   Procedure: HERNIA REPAIR VENTRAL ADULT;  Surgeon: Ricarda Frame, MD;  Location: [facility];  Service: General;  Laterality: N/A;   VIDEO  BRONCHOSCOPY WITH ENDOBRONCHIAL ULTRASOUND N/A 01/25/2023   Procedure: VIDEO BRONCHOSCOPY WITH ENDOBRONCHIAL ULTRASOUND;  Surgeon: Vida Rigger, MD;  Location: [facility];  Service: Thoracic;  Laterality: N/A;   VIDEO BRONCHOSCOPY WITH ENDOBRONCHIAL ULTRASOUND N/A 02/17/2023   Procedure: VIDEO BRONCHOSCOPY WITH ENDOBRONCHIAL ULTRASOUND;  Surgeon: Vida Rigger, MD;  Location: [facility];  Service: Thoracic;  Laterality: N/A;    Social History   Socioeconomic History   Marital status: Married    Spouse name: Jerusalem, Ingalsbe (Spouse) [phone] ([place])   Number of children: 4   Years of education: Not on file   Highest education level: 10th grade  Occupational History   Occupation: retired  Tobacco Use   Smoking status: Former    Current packs/day: 0.00    Average packs/day: 1.5 packs/day for 30.0 years (45.0 ttl pk-yrs)    Types: Cigarettes    Start  date: 05/02/1965    Quit date: 05/03/1995    Years since quitting: 27.8    Passive exposure: Past   Smokeless tobacco: Never  Vaping Use   Vaping status: Never Used  Substance and Sexual Activity   Alcohol use: Not Currently    Alcohol/week: 4.0 standard drinks of alcohol    Types: 4 Shots of liquor per week    Comment: none   Drug use: No   Sexual activity: Not Currently    Birth control/protection: Post-menopausal  Other Topics Concern   Not on file  Social History Narrative   Not on file   Social Determinants of Health   Financial Resource Strain: Low Risk  (12/02/2021)   Overall Financial Resource Strain (CARDIA)    Difficulty of Paying Living Expenses: Not very hard  Food Insecurity: No Food Insecurity (12/05/2022)   Hunger Vital Sign    Worried About Running Out of Food in the Last Year: Never true    Ran Out of Food in the Last Year: Never true  Transportation Needs: No Transportation Needs (12/05/2022)   PRAPARE - Administrator, Civil Service (Medical): No    Lack of Transportation (Non-Medical): No  Physical  Activity: Insufficiently Active (12/05/2022)   Exercise Vital Sign    Days of Exercise per Week: 3 days    Minutes of Exercise per Session: 30 min  Stress: Stress Concern Present (12/05/2022)   [employer] of Occupational Health - Occupational Stress Questionnaire    Feeling of Stress : Rather much  Social Connections: Moderately Isolated (12/05/2022)   Social Connection and Isolation Panel [NHANES]    Frequency of Communication with Friends and Family: Three times a week    Frequency of Social Gatherings with Friends and Family: Twice a week    Attends Religious Services: Never    Database administrator or Organizations: No    Attends Banker Meetings: Never    Marital Status: Married  Catering manager Violence: Not At Risk (12/05/2022)   Humiliation, Afraid, Rape, and Kick questionnaire    Fear of Current or Ex-Partner: No    Emotionally Abused: No    Physically Abused: No    Sexually Abused: No    Family History  Problem Relation Age of Onset   Diabetes Mother    Heart attack Mother    Cancer Sister        cancer of eyes, spread to lung, other places. died at 15   Cancer Sister 18       unknown primary, spread to bone, brain died in 77's   Cancer Paternal Aunt        unk type   Cancer Paternal Grandmother        type unk   Prostate cancer Neg Hx    Kidney cancer Neg Hx    Bladder Cancer Neg Hx    Breast cancer Neg Hx      Current Outpatient Medications:    aspirin EC 81 MG tablet, Take 1 tablet (81 mg total) by mouth daily. Swallow whole., Disp: 90 tablet, Rfl: 3   benzonatate (TESSALON) 100 MG capsule, Take 200 mg by mouth 3 (three) times daily as needed for cough., Disp: , Rfl:    buPROPion (WELLBUTRIN XL) 150 MG 24 hr tablet, Take 3 tablets (450 mg total) by mouth daily., Disp: 90 tablet, Rfl: 1   busPIRone (BUSPAR) 5 MG tablet, Take 1 tablet (5 mg total) by mouth 3 (three) times daily., Disp:  270 tablet, Rfl: 0   cyclobenzaprine (FLEXERIL) 5 MG tablet,  TAKE 1 TABLET BY MOUTH AT BEDTIME, Disp: 30 tablet, Rfl: 0   DULoxetine (CYMBALTA) 60 MG capsule, Take 1 capsule (60 mg total) by mouth 2 (two) times daily. (Patient taking differently: Take 60 mg by mouth daily.), Disp: 180 capsule, Rfl: 0   meloxicam (MOBIC) 7.5 MG tablet, TAKE 1 TABLET BY MOUTH DAILY (Patient not taking: Reported on 02/13/2023), Disp: 30 tablet, Rfl: 0   montelukast (SINGULAIR) 10 MG tablet, Take 1 tablet (10 mg total) by mouth at bedtime., Disp: 90 tablet, Rfl: 3   omeprazole (PRILOSEC) 20 MG capsule, Take 1 capsule (20 mg total) by mouth 2 (two) times daily before a meal., Disp: 60 capsule, Rfl: 11   oxyCODONE (ROXICODONE) 5 MG immediate release tablet, Take 1 tablet (5 mg total) by mouth every 8 (eight) hours as needed for severe pain or moderate pain., Disp: 60 tablet, Rfl: 0   rOPINIRole (REQUIP) 0.25 MG tablet, TAKE 1 TABLET BY MOUTH AT BEDTIME, Disp: 90 tablet, Rfl: 1   rosuvastatin (CRESTOR) 40 MG tablet, TAKE 1 TABLET BY MOUTH DAILY, Disp: 90 tablet, Rfl: 1 No current facility-administered medications for this visit.  Facility-Administered Medications Ordered in Other Visits:    cyanocobalamin ((VITAMIN B-12)) injection 1,000 mcg, 1,000 mcg, Intramuscular, Q30 days, Creig Hines, MD, 1,000 mcg at 08/27/21 1537   cyanocobalamin (VITAMIN B12) injection 1,000 mcg, 1,000 mcg, Intramuscular, Q30 days, Creig Hines, MD, 1,000 mcg at 03/16/22 1101  Physical exam:  Vitals:   02/24/23 1506  BP: 115/75  Pulse: 96  Resp: 18  Temp: 98.2 F (36.8 C)  TempSrc: Tympanic  SpO2: 96%  Weight: 126 lb 9.6 oz (57.4 kg)  Height: 5\' 1"  (1.549 m)   Physical Exam Cardiovascular:     Rate and Rhythm: Normal rate and regular rhythm.     Heart sounds: Normal heart sounds.  Pulmonary:     Effort: Pulmonary effort is normal.     Breath sounds: Normal breath sounds.  Abdominal:     General: Bowel sounds are normal.     Palpations: Abdomen is soft.  Skin:    General: Skin is  warm and dry.  Neurological:     Mental Status: She is alert and oriented to person, place, and time.         Latest Ref Rng & Units 01/28/2023    4:07 AM  CMP  Glucose 70 - 99 mg/dL 951   BUN 8 - 23 mg/dL 25   Creatinine 8.84 - 1.00 mg/dL 1.66   Sodium 063 - 016 mmol/L 137   Potassium 3.5 - 5.1 mmol/L 4.3   Chloride 98 - 111 mmol/L 102   CO2 22 - 32 mmol/L 26   Calcium 8.9 - 10.3 mg/dL 8.5       Latest Ref Rng & Units 01/28/2023    4:07 AM  CBC  WBC 4.0 - 10.5 K/uL 7.5   Hemoglobin 12.0 - 15.0 g/dL 01.0   Hematocrit 93.2 - 46.0 % 37.6   Platelets 150 - 400 K/uL 139     No images are attached to the encounter.  DG Chest Port 1 View  Result Date: 02/17/2023 CLINICAL DATA:  [id number] Status post bronchoscopy [id number] EXAM: PORTABLE CHEST 1 VIEW COMPARISON:  01/28/2023 chest radiograph. FINDINGS: Right internal jugular Port-A-Cath terminates at the cavoatrial junction. Stable cardiomediastinal silhouette with normal heart size. No pneumothorax. Previously visualized small left apical pneumothorax is not apparent  on today's radiograph. No pleural effusion. No pulmonary edema. Left mid lung 1.8 cm pulmonary nodule is stable. No acute consolidative airspace disease. IMPRESSION: 1. No pneumothorax. Previously visualized small left apical pneumothorax is not apparent on today's radiograph. 2. Stable left mid lung pulmonary nodule. Electronically Signed   By: Delbert Phenix M.D.   On: 02/17/2023 11:10   IR IMAGING GUIDED PORT INSERTION  Result Date: 02/07/2023 INDICATION: breast cancer, need port insertion; chemotherapy access EXAM: Chest port placement using ultrasound and fluoroscopic guidance MEDICATIONS: Documented in the EMR ANESTHESIA/SEDATION: Moderate (conscious) sedation was employed during this procedure. A total of Versed 2 mg and Fentanyl 100 mcg was administered intravenously. Moderate Sedation Time: 35 minutes. The patient's level of consciousness and vital signs were monitored  continuously by radiology nursing throughout the procedure under my direct supervision. FLUOROSCOPY TIME:  Fluoroscopy Time: 0.4 minutes (1 mGy) COMPLICATIONS: None immediate. PROCEDURE: Informed written consent was obtained from the patient after a thorough discussion of the procedural risks, benefits and alternatives. All questions were addressed. Maximal Sterile Barrier Technique was utilized including caps, mask, sterile gowns, sterile gloves, sterile drape, hand hygiene and skin antiseptic. A timeout was performed prior to the initiation of the procedure. The patient was placed supine on the exam table. The right neck and chest was prepped and draped in the standard sterile fashion. A preliminary ultrasound of the right neck was performed and demonstrates a patent right internal jugular vein. A permanent ultrasound image was stored in the electronic medical record. The overlying skin was anesthetized with 1% Lidocaine. Using ultrasound guidance, access was obtained into the right internal jugular vein using a 21 gauge micropuncture set. A wire was advanced into the SVC, a short incision was made at the puncture site, and serial dilatation performed. Next, in an ipsilateral infraclavicular location, an incision was made at the site of the subcutaneous reservoir. Blunt dissection was used to open a pocket to contain the reservoir. A subcutaneous tunnel was then created from the port site to the puncture site. A(n) 8 Fr single lumen catheter was advanced through the tunnel. The catheter was attached to the port and this was placed in the subcutaneous pocket. Under fluoroscopic guidance, a peel away sheath was placed, and the catheter was trimmed to the appropriate length and was advanced into the central veins. The catheter length is 21 cm. The tip of the catheter lies near the superior cavoatrial junction. The port flushes and aspirates appropriately. The port was flushed and locked with heparinized saline. The  port pocket was closed in 2 layers using 3-0 and 4-0 Vicryl/absorbable suture. Dermabond was also applied to both incisions. The patient tolerated the procedure well and was transferred to recovery in stable condition. IMPRESSION: Successful placement of a right-sided chest port via the right internal jugular vein. The catheter tip lies near the superior cavoatrial junction. The port is ready for immediate use. Electronically Signed   By: Olive Bass M.D.   On: 02/07/2023 14:09   DG Chest Port 1 View  Result Date: 01/28/2023 CLINICAL DATA:  Follow-up pneumothorax.  Left chest tube in place. EXAM: PORTABLE CHEST 1 VIEW COMPARISON:  01/27/2023 FINDINGS: Stable position of the left basilar chest tube. Small left apical pleural line is identified scratch set small left apical pneumothorax is again noted and appears unchanged compared with the previous exam. No pleural fluid, interstitial edema or airspace disease. Left midlung nodular opacity appears unchanged in the interval. IMPRESSION: 1. Stable position of left basilar chest  tube. 2. Stable small left apical pneumothorax. 3. Unchanged left midlung nodular opacity. Electronically Signed   By: Signa Kell M.D.   On: 01/28/2023 08:51   DG Chest Port 1 View  Result Date: 01/28/2023 CLINICAL DATA:  Chest tube removal EXAM: PORTABLE CHEST 1 VIEW COMPARISON:  01/20/2023 FINDINGS: Interval movable of LEFT chest tube. Trace LEFT apical pneumothorax measures 2-3 mm from the apical chest wall LEFT mid lung nodules again noted. IMPRESSION: Trace LEFT apical pneumothorax following chest tube removal. LEFT midlung nodules. These results will be called to the ordering clinician or representative by the Radiologist Assistant, and communication documented in the PACS or [employer]. Electronically Signed   By: Genevive Bi M.D.   On: 01/28/2023 08:42     Assessment and plan- Patient is a 67 y.o. female history of multiple malignancies in the past now with  biopsy-proven non-small cell carcinoma  PET scan fromAugust 2024 showed hypermetabolic mediastinal subcarinal and left hilar adenopathy and station 7 lymph node biopsy confirms non-small cell carcinoma.  Primary is however unclear if this represents metastases from triple negative breast cancer versus if this is a primary non-small cell lung cancer.  We have discussed her scan at tumor board and the hypermetabolism noted in the right ninth rib appears to be posttraumatic.  She has bilateral subcentimeter lung nodules which were also reviewed at tumor board and do not appear secondary to malignancy but likely inflammatory.  She was noted to have 2 distinct lung nodules in her left lung out of which is the 13 mm lingular nodule which is hypermetabolic in the second nodule was not hypermetabolic.  After discussion with radiation oncology given the absence of distant metastatic disease we could consider treating this as a stage III lung cancer with concurrent chemoradiation with weekly CarboTaxol chemotherapy.  CarboTaxol chemotherapy will also cover metastatic breast cancer.  I will reach out to pathology next week to see if they can be additional testing done on the station 7 lymph node biopsy.  If it is not possible to characterize it further I am inclined to treat this with concurrent chemoradiation followed by continued observation and consideration for treatment if there is further progression.  Patient herself is interested in avoiding prolonged chemo is much as possible  She has a port in place and I am referring her to radiation oncology at this time.  Weekly CarboTaxol chemotherapy will start based on start of radiation.   Visit Diagnosis 1. Non-small cell carcinoma of left lung (HCC)   2. Goals of care, counseling/discussion      Dr. Owens Shark, MD, MPH [facility] [phone] 02/26/2023 7:13 PM

## 2023-02-27 ENCOUNTER — Ambulatory Visit: Payer: Medicare HMO | Admitting: Urology

## 2023-02-27 ENCOUNTER — Encounter: Payer: Self-pay | Admitting: Urology

## 2023-02-27 VITALS — Ht 61.0 in | Wt 126.0 lb

## 2023-02-27 DIAGNOSIS — N3946 Mixed incontinence: Secondary | ICD-10-CM

## 2023-02-27 LAB — MICROSCOPIC EXAMINATION

## 2023-02-27 LAB — URINALYSIS, COMPLETE
Bilirubin, UA: NEGATIVE
Glucose, UA: NEGATIVE
Ketones, UA: NEGATIVE
Leukocytes,UA: NEGATIVE
Nitrite, UA: NEGATIVE
Protein,UA: NEGATIVE
RBC, UA: NEGATIVE
Specific Gravity, UA: >1.03 — ABNORMAL HIGH (ref 1.005–1.030)
Urobilinogen, Ur: 0.2 mg/dL (ref 0.2–1.0)
pH, UA: 5.5 (ref 5.0–7.5)

## 2023-02-27 MED ORDER — TROSPIUM CHLORIDE ER 60 MG PO CP24: 1.0000 | ORAL_CAPSULE | Freq: Every day | ORAL | 11 refills | Status: DC

## 2023-02-27 NOTE — Progress Notes (Signed)
02/27/2023 9:57 AM   Denise Watts 08/23/1955 [id number]  Referring provider: Jacky Kindle, FNP [address] [place],  [place] [address]  Chief Complaint  Patient presents with   Follow-up    Discuss UDS    HPI: Denise Watts: Patient has urgency incontinence and failed Gemtesa.  She has Parkinson's and is on Myrbetriq.  She was cleared for microscopic hematuria with a Bosniak 2 cyst left kidney.  Normal cystoscopy April 2024.  She has vaginal discharge.  Saw gynecology in August 2023 for vaginal discharge.  Residual 0 mL.  Culture March 2024 negative   Today Patient leaks with coughing sneezing bending and lifting.  She has urge incontinence and foot on the floor syndrome.  Primary symptom is urge incontinence.  She has moderately severe bedwetting.  She wears 4-5 depends a day that are quite wet.   She voids every hour and cannot hold it for 2 hours.  She is up twice at night   Myrbetriq at higher dose is not helping.  Azo Standard may help some.  She gets 3-4 bladder infections a year with frequency and small volume voiding that would temporarily respond to antibiotics.   She has Parkinson's and has not had a hysterectomy.   Still has brownish vaginal discharge   On pelvic examination patient had mild hypermobility bladder neck with minimal cystocele.  No stress incontinence with moderate cough.  She had a narrow introitus and shortening of the urethra.  No obvious discharge    Patient has mixed incontinence and bedwetting.  She has Parkinson's.  She gets bladder infections.  Call if culture positive.  Role of urodynamics discussed.  See gynecology for discharge.  She agreed with the plan.  Treating stress incontinence may not be in her best interest if she has a neurogenic bladder with Parkinson's and she is motivated to get as dry as possible   Today Frequency stable.  Incontinence stable.  Last culture positive On urodynamics patient voided 48 mL with a max flow of 10 mL/s  and was catheterized for few milliliters.  Maximum bladder capacity was on 30 mL.  She increased bladder sensation.  Bladder was unstable reaching pressure of [place] 1 cm of water.  She had a lot of bladder overactivity low pressure.  She had moderately severe urge incontinence associated.  200 mL her cuff leak point pressure was 63 cm of water with mild to moderate leakage.  Her Valsalva leak point pressure was 34 cmH2O with mild leakage.  During voluntary voiding she voided 61 mL with a maximal flow 6 mL/s.  Max voiding pressure 5 to 18 cm of water.  Contraction was not well-sustained.  EMG activity within normal limits.  Neck to send it less than a centimeter.     PMH: Past Medical History:  Diagnosis Date   Adenomatous colon polyp    Adrenal carcinoma, right (HCC) 12/28/2016   a.) high grade carcinoma with extensive necrosis --> s/p adrenalectomy   Anxiety    Aortic atherosclerosis (HCC)    Arthritis of right knee    Atrial fibrillation and flutter (HCC)    a.) CHA2DS2-VASc = 3 (age, sex, vascular disease history) as of 02/13/2023; b.) cardiac rate/rhythm maintained intrinsically without pharmacological intervention; no chronic OAC (does take low dose ASA)   B12 deficiency    Breast cancer (HCC) 1997   a.) stage IB (cT1a, cN0, cM0, G3, ER+, PR-, HER2-); s/p lumpectomy + XRT   COPD (chronic obstructive pulmonary disease) (HCC)  Coronary artery disease    a.) cCTA 12/09/2021: Ca2+ = 135 (80th %'ile for age/sex/race match contol) --> 25-49% pLAD and RCA distributions   Depression    Diastolic dysfunction    a.) TTE 12/11/2020, no RWMAs, norm RVSF, G1DD; b.) TTE 12/14/2021: EF 55-60, no RWMAs, norm RVSF, G1DD; c.) TTE 01/27/2023: EF 50-55%, no RWMAs, G1DD, mild RAE, mild MR, mod TR   Dry cough 03/02/2017   Dyspnea    Full dentures    GERD (gastroesophageal reflux disease)    History of hiatal hernia    HLD (hyperlipidemia)    Invasive carcinoma of breast (HCC) 11/15/2017   a.) path  (+) for G3, ER -, PR -, HER2/neu - ; s/p lumpectomy 01/17/2018 + 4 cycles TC systemic chemotherapy + XRT   Long term current use of aspirin    Personal history of chemotherapy    Personal history of radiation therapy    Pneumothorax of left lung after biopsy 01/25/2023   a.) hypoxic in PACU requiring re-intubation --> transferred to ICU --> chest tube placed by PCCM   RLS (restless legs syndrome)    a.) on ropinirole   Skin cancer    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   a.) Stage IB (cT1a, cN0, cM0, G3, ER+, PR-, HER2-)    Surgical History: Past Surgical History:  Procedure Laterality Date   BIOPSY  01/05/2023   Procedure: BIOPSY;  Surgeon: Toney Reil, MD;  Location: [facility];  Service: Gastroenterology;;   BREAST BIOPSY Right 1997   positive   BREAST BIOPSY Right 11/15/2017   INVASIVE MAMMARY CARCINOMA triple negative   BREAST BIOPSY Right 07/20/2020   [place] bx, Q marker, neg   BREAST LUMPECTOMY Right 1997   2019 also   BREAST LUMPECTOMY WITH SENTINEL LYMPH NODE BIOPSY Right 12/08/2017   Procedure: BREAST LUMPECTOMY WITH SENTINEL LYMPH NODE BX;  Surgeon: Earline Mayotte, MD;  Location: [facility];  Service: General;  Laterality: Right;   BREAST REDUCTION WITH MASTOPEXY Left 06/10/2019   Procedure: Left breast mastopexy reduction for symmetry;  Surgeon: Peggye Form, DO;  Location: [facility];  Service: Plastics;  Laterality: Left;  total 2.5 hours   CATARACT EXTRACTION W/PHACO Right 02/07/2022   Procedure: CATARACT EXTRACTION PHACO AND INTRAOCULAR LENS PLACEMENT (IOC) RIGHT DIABETIC;  Surgeon: Nevada Crane, MD;  Location: [facility];  Service: Ophthalmology;  Laterality: Right;  3.60 00:34.7   CATARACT EXTRACTION W/PHACO Left 02/28/2022   Procedure: CATARACT EXTRACTION PHACO AND INTRAOCULAR LENS PLACEMENT (IOC) LEFT DIABETIC 4.11 00:26.1;  Surgeon: Nevada Crane, MD;  Location: [facility];  Service:  Ophthalmology;  Laterality: Left;   COLONOSCOPY  2015   COLONOSCOPY N/A 01/05/2023   Procedure: COLONOSCOPY;  Surgeon: Toney Reil, MD;  Location: [facility];  Service: Gastroenterology;  Laterality: N/A;   COLONOSCOPY WITH PROPOFOL N/A 10/29/2019   Procedure: COLONOSCOPY WITH PROPOFOL;  Surgeon: Midge Minium, MD;  Location: [facility];  Service: Endoscopy;  Laterality: N/A;   ESOPHAGOGASTRODUODENOSCOPY (EGD) WITH PROPOFOL N/A 05/24/2019   Procedure: ESOPHAGOGASTRODUODENOSCOPY (EGD) WITH PROPOFOL;  Surgeon: Midge Minium, MD;  Location: [facility];  Service: Endoscopy;  Laterality: N/A;   ESOPHAGOGASTRODUODENOSCOPY (EGD) WITH PROPOFOL N/A 09/06/2022   Procedure: ESOPHAGOGASTRODUODENOSCOPY (EGD) WITH PROPOFOL;  Surgeon: Midge Minium, MD;  Location: [facility];  Service: Endoscopy;  Laterality: N/A;   ESOPHAGOGASTRODUODENOSCOPY (EGD) WITH PROPOFOL  01/05/2023   Procedure: ESOPHAGOGASTRODUODENOSCOPY (EGD) WITH PROPOFOL;  Surgeon: Toney Reil, MD;  Location:  [facility];  Service: Gastroenterology;;   ESOPHAGUS SURGERY     IR IMAGING GUIDED PORT INSERTION  02/07/2023   KNEE SURGERY Right    LIPOSUCTION WITH LIPOFILLING Right 06/10/2019   Procedure: right breast scar and capsule release with fat grafting;  Surgeon: Peggye Form, DO;  Location: [facility];  Service: Plastics;  Laterality: Right;   POLYPECTOMY  01/05/2023   Procedure: POLYPECTOMY;  Surgeon: Toney Reil, MD;  Location: [facility];  Service: Gastroenterology;;   [facility] REMOVAL     PORTACATH PLACEMENT Right 01/17/2018   Procedure: INSERTION PORT-A-CATH- RIGHT;  Surgeon: Earline Mayotte, MD;  Location: [facility];  Service: General;  Laterality: Right;   ROBOTIC ADRENALECTOMY Right 12/28/2016   Procedure: ROBOTIC ADRENALECTOMY;  Surgeon: Vanna Scotland, MD;  Location: [facility];  Service: Urology;  Laterality: Right;   SKIN CANCER EXCISION     THROAT SURGERY  1998    throat cancer    TUBAL LIGATION     VENTRAL HERNIA REPAIR N/A 03/03/2017   Procedure: HERNIA REPAIR VENTRAL ADULT;  Surgeon: Ricarda Frame, MD;  Location: [facility];  Service: General;  Laterality: N/A;   VIDEO BRONCHOSCOPY WITH ENDOBRONCHIAL ULTRASOUND N/A 01/25/2023   Procedure: VIDEO BRONCHOSCOPY WITH ENDOBRONCHIAL ULTRASOUND;  Surgeon: Vida Rigger, MD;  Location: [facility];  Service: Thoracic;  Laterality: N/A;   VIDEO BRONCHOSCOPY WITH ENDOBRONCHIAL ULTRASOUND N/A 02/17/2023   Procedure: VIDEO BRONCHOSCOPY WITH ENDOBRONCHIAL ULTRASOUND;  Surgeon: Vida Rigger, MD;  Location: [facility];  Service: Thoracic;  Laterality: N/A;    Home Medications:  Allergies as of 02/27/2023       Reactions   Sulfa Antibiotics Other (See Comments)   Mouth sore and raw        Medication List        Accurate as of February 27, 2023  9:57 AM. If you have any questions, ask your nurse or doctor.          aspirin EC 81 MG tablet Take 1 tablet (81 mg total) by mouth daily. Swallow whole.   benzonatate 100 MG capsule Commonly known as: TESSALON Take 200 mg by mouth 3 (three) times daily as needed for cough.   buPROPion 150 MG 24 hr tablet Commonly known as: Wellbutrin XL Take 3 tablets (450 mg total) by mouth daily.   busPIRone 5 MG tablet Commonly known as: BUSPAR Take 1 tablet (5 mg total) by mouth 3 (three) times daily.   cyclobenzaprine 5 MG tablet Commonly known as: FLEXERIL TAKE 1 TABLET BY MOUTH AT BEDTIME   DULoxetine 60 MG capsule Commonly known as: CYMBALTA Take 1 capsule (60 mg total) by mouth 2 (two) times daily.   meloxicam 7.5 MG tablet Commonly known as: MOBIC TAKE 1 TABLET BY MOUTH DAILY   montelukast 10 MG tablet Commonly known as: SINGULAIR Take 1 tablet (10 mg total) by mouth at bedtime.   omeprazole 20 MG capsule Commonly known as: PRILOSEC Take 1 capsule (20 mg total) by mouth 2 (two) times daily before a meal.   oxyCODONE 5 MG immediate release  tablet Commonly known as: Roxicodone Take 1 tablet (5 mg total) by mouth every 8 (eight) hours as needed for severe pain or moderate pain.   rOPINIRole 0.25 MG tablet Commonly known as: REQUIP TAKE 1 TABLET BY MOUTH AT BEDTIME   rosuvastatin 40 MG tablet Commonly known as: CRESTOR TAKE 1 TABLET BY MOUTH DAILY        Allergies:  Allergies  Allergen Reactions  Sulfa Antibiotics Other (See Comments)    Mouth sore and raw    Family History: Family History  Problem Relation Age of Onset   Diabetes Mother    Heart attack Mother    Cancer Sister        cancer of eyes, spread to lung, other places. died at 13   Cancer Sister 70       unknown primary, spread to bone, brain died in 55's   Cancer Paternal Aunt        unk type   Cancer Paternal Grandmother        type unk   Prostate cancer Neg Hx    Kidney cancer Neg Hx    Bladder Cancer Neg Hx    Breast cancer Neg Hx     Social History:  reports that she quit smoking about 27 years ago. Her smoking use included cigarettes. She started smoking about 57 years ago. She has a 45 pack-year smoking history. She has been exposed to tobacco smoke. She has never used smokeless tobacco. She reports that she does not currently use alcohol after a past usage of about 4.0 standard drinks of alcohol per week. She reports that she does not use drugs.  ROS:                                        Physical Exam: There were no vitals taken for this visit.  Constitutional:  Alert and oriented, No acute distress. HEENT: [place] AT, moist mucus membranes.  Trachea midline, no masses. Cardiovascular: No clubbing, cyanosis, or edema.   Laboratory Data: Lab Results  Component Value Date   WBC 7.5 01/28/2023   HGB 11.8 (L) 01/28/2023   HCT 37.6 01/28/2023   MCV 101.9 (H) 01/28/2023   PLT 139 (L) 01/28/2023    Lab Results  Component Value Date   CREATININE 0.72 01/28/2023    No results found for: "PSA"  No results  found for: "TESTOSTERONE"  Lab Results  Component Value Date   HGBA1C 5.7 (H) 11/03/2021    Urinalysis    Component Value Date/Time   COLORURINE STRAW (A) 12/06/2022 1741   APPEARANCEUR CLEAR (A) 12/06/2022 1741   APPEARANCEUR Hazy (A) 11/21/2022 1450   LABSPEC 1.005 12/06/2022 1741   PHURINE 7.0 12/06/2022 1741   GLUCOSEU NEGATIVE 12/06/2022 1741   HGBUR NEGATIVE 12/06/2022 1741   BILIRUBINUR NEGATIVE 12/06/2022 1741   BILIRUBINUR Negative 11/21/2022 1450   KETONESUR NEGATIVE 12/06/2022 1741   PROTEINUR NEGATIVE 12/06/2022 1741   UROBILINOGEN 0.2 11/05/2021 1736   NITRITE NEGATIVE 12/06/2022 1741   LEUKOCYTESUR NEGATIVE 12/06/2022 1741    Pertinent Imaging:   Assessment & Plan: Patient has mixed incontinence.  She does have a low leak point pressures but I still believe that clinically approximately 70% of bladder dysfunction is due to overactive bladder likely related to Parkinson's.  She has a small functional capacity with a lot of overactivity and and secondary urge incontinence and high-volume bedwetting.  If I treat her stress incontinence I will use a bulking agent but she is at high risk of persistent and/or worsening OAB afterwards.  I would not recommend a sling and she is also likely at higher risk of retention.  I will see her back on trospium 60 mg once daily in 6 weeks and proceed accordingly and she agreed with plan.  She has had Parkinson's for about  2 years  1. Mixed incontinence  - Urinalysis, Complete   No follow-ups on file.  Martina Sinner, MD  [facility] [address] [place], [place] [address] [phone]

## 2023-02-28 ENCOUNTER — Telehealth: Payer: Self-pay | Admitting: *Deleted

## 2023-02-28 NOTE — Telephone Encounter (Signed)
Fax received that pt insurance will not cover Trospium 60mg , insurance is suggesting :  fesoterodine fumarate ER Detrol LA Leslye Peer

## 2023-03-01 ENCOUNTER — Encounter: Payer: Self-pay | Admitting: *Deleted

## 2023-03-01 ENCOUNTER — Encounter: Payer: Self-pay | Admitting: Urology

## 2023-03-01 DIAGNOSIS — C3492 Malignant neoplasm of unspecified part of left bronchus or lung: Secondary | ICD-10-CM

## 2023-03-01 MED ORDER — TOLTERODINE TARTRATE ER 4 MG PO CP24: 4.0000 mg | ORAL_CAPSULE | Freq: Every day | ORAL | 11 refills | Status: DC

## 2023-03-01 NOTE — Progress Notes (Signed)
Per Dr. Smith Robert, pt to be started on concurrent chemo/rads to treat newly diagnosed non small cell carcinoma, presumed lung origin. Referral for rad-onc placed and pt to follow up with Dr. Smith Robert to start weekly Ledell Noss, taxol when radiation starts. Will call pt with appts once scheduled.

## 2023-03-01 NOTE — Telephone Encounter (Signed)
Spoke with patient and advised results, Trospium cost $88 and pt could not afford that. Will send in Detrol LA  rx sent to pharmacy by e-script

## 2023-03-02 LAB — CULTURE, URINE COMPREHENSIVE

## 2023-03-02 NOTE — Addendum Note (Signed)
Encounter addended by: Edward Qualia on: 03/02/2023 12:16 PM  Actions taken: Imaging Exam ended

## 2023-03-03 ENCOUNTER — Other Ambulatory Visit: Payer: Self-pay

## 2023-03-03 MED ORDER — CIPROFLOXACIN HCL 250 MG PO TABS: 250.0000 mg | ORAL_TABLET | Freq: Two times a day (BID) | ORAL | 0 refills | Status: DC

## 2023-03-03 NOTE — Telephone Encounter (Signed)
+   u/c   Med erxed.   Pt aware.

## 2023-03-07 ENCOUNTER — Encounter: Payer: Self-pay | Admitting: Radiation Oncology

## 2023-03-07 ENCOUNTER — Encounter: Payer: Self-pay | Admitting: *Deleted

## 2023-03-07 ENCOUNTER — Other Ambulatory Visit: Payer: Self-pay | Admitting: Family Medicine

## 2023-03-07 ENCOUNTER — Ambulatory Visit
Admission: RE | Admit: 2023-03-07 | Discharge: 2023-03-07 | Disposition: A | Discharge status: Home / Self Care | Payer: Medicare HMO | Source: Ambulatory Visit | Attending: Radiation Oncology | Admitting: Radiation Oncology

## 2023-03-07 VITALS — Temp 98.0°F | Resp 16 | Ht 61.0 in | Wt 126.4 lb

## 2023-03-07 DIAGNOSIS — Z5111 Encounter for antineoplastic chemotherapy: Secondary | ICD-10-CM | POA: Insufficient documentation

## 2023-03-07 DIAGNOSIS — C3492 Malignant neoplasm of unspecified part of left bronchus or lung: Secondary | ICD-10-CM | POA: Insufficient documentation

## 2023-03-07 DIAGNOSIS — C3412 Malignant neoplasm of upper lobe, left bronchus or lung: Secondary | ICD-10-CM | POA: Diagnosis not present

## 2023-03-07 DIAGNOSIS — Z8521 Personal history of malignant neoplasm of larynx: Secondary | ICD-10-CM | POA: Insufficient documentation

## 2023-03-07 DIAGNOSIS — Z853 Personal history of malignant neoplasm of breast: Secondary | ICD-10-CM | POA: Insufficient documentation

## 2023-03-07 DIAGNOSIS — Z510 Encounter for antineoplastic radiation therapy: Secondary | ICD-10-CM | POA: Insufficient documentation

## 2023-03-07 DIAGNOSIS — C3482 Malignant neoplasm of overlapping sites of left bronchus and lung: Secondary | ICD-10-CM

## 2023-03-07 DIAGNOSIS — Z87891 Personal history of nicotine dependence: Secondary | ICD-10-CM | POA: Insufficient documentation

## 2023-03-07 DIAGNOSIS — Z923 Personal history of irradiation: Secondary | ICD-10-CM | POA: Insufficient documentation

## 2023-03-07 NOTE — Progress Notes (Signed)
Met with patient during follow up visit with Dr. Rushie Chestnut to discuss radiation treatment for lung cancer. All questions answered during visit. Informed pt that will let Dr. Smith Robert know about tentative radiation start date in order to plan weekly chemo. Informed pt that will call her with those appts once scheduled. Contact info given and instructed to call with any questions or needs. Pt verbalized understanding.

## 2023-03-07 NOTE — Consult Note (Signed)
NEW PATIENT EVALUATION  Name: Denise Watts  MRN: [id number]  Date:   03/07/2023     DOB: 1956/04/22   This 67 y.o. female patient presents to the clinic for initial evaluation of stage IIIb non-small cell lung cancer of the left lung and patient previously treated for breast cancer 1997 adrenal carcinoma and squamous cell carcinoma vocal cords in 2015  REFERRING PHYSICIAN: Jacky Kindle, FNP  CHIEF COMPLAINT:  Chief Complaint  Patient presents with   Lung Cancer    DIAGNOSIS: Left lung cancer stage IIIb   PREVIOUS INVESTIGATIONS:  CT scans PET CT scans reviewed Clinical notes reviewed Pathology reports reviewed Case presented at weekly tumor conference  HPI: Patient is a 67 year old female well-known to our department.  She has had multiple previous lung cancers including breast cancer 1997 squamous cell carcinoma vocal cord in 2015 adrenal carcinoma of unknown primary.  Patient has known bilateral lung nodules that have been followed although CT scan back in August 2024 showed multiple bilateral pulm nodules largest 1 in the left upper lobe had increased in size and density from prior examination now measuring 1.5 x 1.3 cm.  PET scan the same month showed intrathoracic nodal and pulmonary parenchyma to metastatic disease.  She had a hypermetabolic right ninth rib fracture although this was resumed reviewed at our tumor conference and thought to be traumatic in nature.  She recently underwent bronchoscopy with cytology positive for malignant cells consistent with non-small cell carcinoma.  Her case was presented at tumor conference and with the absence of any distant metastatic disease we consider treating this as a stage III non-small cell lung cancer with concurrent chemotherapy CarboTaxol and radiation.  There is some question whether this still could be metastatic breast cancer although CarboTaxol would address this.  She is seen today for evaluation.  She seems to be weak she is having  some slight dyspnea on exertion.  She has a mild cough no significant hemoptysis at this time.  PLANNED TREATMENT REGIMEN: Concurrent chemoradiation  PAST MEDICAL HISTORY:  has a past medical history of Adenomatous colon polyp, Adrenal carcinoma, right (HCC) (12/28/2016), Anxiety, Aortic atherosclerosis (HCC), Arthritis of right knee, Atrial fibrillation and flutter (HCC), B12 deficiency, Breast cancer (HCC) (1997), COPD (chronic obstructive pulmonary disease) (HCC), Coronary artery disease, Depression, Diastolic dysfunction, Dry cough (03/02/2017), Dyspnea, Full dentures, GERD (gastroesophageal reflux disease), History of hiatal hernia, HLD (hyperlipidemia), Invasive carcinoma of breast (HCC) (11/15/2017), Long term current use of aspirin, Personal history of chemotherapy, Personal history of radiation therapy, Pneumothorax of left lung after biopsy (01/25/2023), RLS (restless legs syndrome), Skin cancer, and 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    PAST SURGICAL HISTORY:  Past Surgical History:  Procedure Laterality Date   BIOPSY  01/05/2023   Procedure: BIOPSY;  Surgeon: Toney Reil, MD;  Location: [facility];  Service: Gastroenterology;;   BREAST BIOPSY Right 1997   positive   BREAST BIOPSY Right 11/15/2017   INVASIVE MAMMARY CARCINOMA triple negative   BREAST BIOPSY Right 07/20/2020   [place] bx, Q marker, neg   BREAST LUMPECTOMY Right 1997   2019 also   BREAST LUMPECTOMY WITH SENTINEL LYMPH NODE BIOPSY Right 12/08/2017   Procedure: BREAST LUMPECTOMY WITH SENTINEL LYMPH NODE BX;  Surgeon: Earline Mayotte, MD;  Location: [facility];  Service: General;  Laterality: Right;   BREAST REDUCTION WITH MASTOPEXY Left 06/10/2019   Procedure: Left breast mastopexy reduction for symmetry;  Surgeon: Peggye Form, DO;  Location: [facility]  [place];  Service: Plastics;  Laterality: Left;  total 2.5 hours   CATARACT EXTRACTION W/PHACO Right 02/07/2022   Procedure:  CATARACT EXTRACTION PHACO AND INTRAOCULAR LENS PLACEMENT (IOC) RIGHT DIABETIC;  Surgeon: Nevada Crane, MD;  Location: [facility];  Service: Ophthalmology;  Laterality: Right;  3.60 00:34.7   CATARACT EXTRACTION W/PHACO Left 02/28/2022   Procedure: CATARACT EXTRACTION PHACO AND INTRAOCULAR LENS PLACEMENT (IOC) LEFT DIABETIC 4.11 00:26.1;  Surgeon: Nevada Crane, MD;  Location: [facility];  Service: Ophthalmology;  Laterality: Left;   COLONOSCOPY  2015   COLONOSCOPY N/A 01/05/2023   Procedure: COLONOSCOPY;  Surgeon: Toney Reil, MD;  Location: [facility];  Service: Gastroenterology;  Laterality: N/A;   COLONOSCOPY WITH PROPOFOL N/A 10/29/2019   Procedure: COLONOSCOPY WITH PROPOFOL;  Surgeon: Midge Minium, MD;  Location: [facility];  Service: Endoscopy;  Laterality: N/A;   ESOPHAGOGASTRODUODENOSCOPY (EGD) WITH PROPOFOL N/A 05/24/2019   Procedure: ESOPHAGOGASTRODUODENOSCOPY (EGD) WITH PROPOFOL;  Surgeon: Midge Minium, MD;  Location: [facility];  Service: Endoscopy;  Laterality: N/A;   ESOPHAGOGASTRODUODENOSCOPY (EGD) WITH PROPOFOL N/A 09/06/2022   Procedure: ESOPHAGOGASTRODUODENOSCOPY (EGD) WITH PROPOFOL;  Surgeon: Midge Minium, MD;  Location: [facility];  Service: Endoscopy;  Laterality: N/A;   ESOPHAGOGASTRODUODENOSCOPY (EGD) WITH PROPOFOL  01/05/2023   Procedure: ESOPHAGOGASTRODUODENOSCOPY (EGD) WITH PROPOFOL;  Surgeon: Toney Reil, MD;  Location: [facility];  Service: Gastroenterology;;   ESOPHAGUS SURGERY     IR IMAGING GUIDED PORT INSERTION  02/07/2023   KNEE SURGERY Right    LIPOSUCTION WITH LIPOFILLING Right 06/10/2019   Procedure: right breast scar and capsule release with fat grafting;  Surgeon: Peggye Form, DO;  Location: [facility];  Service: Plastics;  Laterality: Right;   POLYPECTOMY  01/05/2023   Procedure: POLYPECTOMY;  Surgeon: Toney Reil, MD;  Location: [facility];  Service:  Gastroenterology;;   [facility] REMOVAL     PORTACATH PLACEMENT Right 01/17/2018   Procedure: INSERTION PORT-A-CATH- RIGHT;  Surgeon: Earline Mayotte, MD;  Location: [facility];  Service: General;  Laterality: Right;   ROBOTIC ADRENALECTOMY Right 12/28/2016   Procedure: ROBOTIC ADRENALECTOMY;  Surgeon: Vanna Scotland, MD;  Location: [facility];  Service: Urology;  Laterality: Right;   SKIN CANCER EXCISION     THROAT SURGERY  1998   throat cancer    TUBAL LIGATION     VENTRAL HERNIA REPAIR N/A 03/03/2017   Procedure: HERNIA REPAIR VENTRAL ADULT;  Surgeon: Ricarda Frame, MD;  Location: [facility];  Service: General;  Laterality: N/A;   VIDEO BRONCHOSCOPY WITH ENDOBRONCHIAL ULTRASOUND N/A 01/25/2023   Procedure: VIDEO BRONCHOSCOPY WITH ENDOBRONCHIAL ULTRASOUND;  Surgeon: Vida Rigger, MD;  Location: [facility];  Service: Thoracic;  Laterality: N/A;   VIDEO BRONCHOSCOPY WITH ENDOBRONCHIAL ULTRASOUND N/A 02/17/2023   Procedure: VIDEO BRONCHOSCOPY WITH ENDOBRONCHIAL ULTRASOUND;  Surgeon: Vida Rigger, MD;  Location: [facility];  Service: Thoracic;  Laterality: N/A;    FAMILY HISTORY: family history includes Cancer in her paternal aunt, paternal grandmother, and sister; Cancer (age of onset: 67) in her sister; Diabetes in her mother; Heart attack in her mother.  SOCIAL HISTORY:  reports that she quit smoking about 27 years ago. Her smoking use included cigarettes. She started smoking about 57 years ago. She has a 45 pack-year smoking history. She has been exposed to tobacco smoke. She has never used smokeless tobacco. She reports that she does not currently use alcohol after a past usage of about 4.0 standard drinks of alcohol per week. She reports  that she does not use drugs.  ALLERGIES: Sulfa antibiotics  MEDICATIONS:  Current Outpatient Medications  Medication Sig Dispense Refill   aspirin EC 81 MG tablet Take 1 tablet (81 mg total) by mouth daily. Swallow whole. 90 tablet 3   benzonatate  (TESSALON) 100 MG capsule Take 200 mg by mouth 3 (three) times daily as needed for cough.     buPROPion (WELLBUTRIN XL) 150 MG 24 hr tablet Take 3 tablets (450 mg total) by mouth daily. 90 tablet 1   busPIRone (BUSPAR) 5 MG tablet Take 1 tablet (5 mg total) by mouth 3 (three) times daily. 270 tablet 0   ciprofloxacin (CIPRO) 250 MG tablet Take 1 tablet (250 mg total) by mouth 2 (two) times daily. 14 tablet 0   cyclobenzaprine (FLEXERIL) 5 MG tablet TAKE 1 TABLET BY MOUTH AT BEDTIME 30 tablet 0   DULoxetine (CYMBALTA) 60 MG capsule Take 1 capsule (60 mg total) by mouth 2 (two) times daily. 180 capsule 0   meloxicam (MOBIC) 7.5 MG tablet TAKE 1 TABLET BY MOUTH DAILY 30 tablet 0   montelukast (SINGULAIR) 10 MG tablet Take 1 tablet (10 mg total) by mouth at bedtime. 90 tablet 3   omeprazole (PRILOSEC) 20 MG capsule Take 1 capsule (20 mg total) by mouth 2 (two) times daily before a meal. 60 capsule 11   oxyCODONE (ROXICODONE) 5 MG immediate release tablet Take 1 tablet (5 mg total) by mouth every 8 (eight) hours as needed for severe pain or moderate pain. 60 tablet 0   rOPINIRole (REQUIP) 0.25 MG tablet TAKE 1 TABLET BY MOUTH AT BEDTIME 90 tablet 1   rosuvastatin (CRESTOR) 40 MG tablet TAKE 1 TABLET BY MOUTH DAILY 90 tablet 1   tolterodine (DETROL LA) 4 MG 24 hr capsule Take 1 capsule (4 mg total) by mouth daily. 30 capsule 11   No current facility-administered medications for this encounter.   Facility-Administered Medications Ordered in Other Encounters  Medication Dose Route Frequency Provider Last Rate Last Admin   cyanocobalamin ((VITAMIN B-12)) injection 1,000 mcg  1,000 mcg Intramuscular Q30 days Creig Hines, MD   1,000 mcg at 08/27/21 1537   cyanocobalamin (VITAMIN B12) injection 1,000 mcg  1,000 mcg Intramuscular Q30 days Creig Hines, MD   1,000 mcg at 03/16/22 1101    ECOG PERFORMANCE STATUS:  1 - Symptomatic but completely ambulatory  REVIEW OF SYSTEMS: Multiple cancers as  described above. Patient denies any weight loss, fatigue, weakness, fever, chills or night sweats. Patient denies any loss of vision, blurred vision. Patient denies any ringing  of the ears or hearing loss. No irregular heartbeat. Patient denies heart murmur or history of fainting. Patient denies any chest pain or pain radiating to her upper extremities. Patient denies any shortness of breath, difficulty breathing at night, cough or hemoptysis. Patient denies any swelling in the lower legs. Patient denies any nausea vomiting, vomiting of blood, or coffee ground material in the vomitus. Patient denies any stomach pain. Patient states has had normal bowel movements no significant constipation or diarrhea. Patient denies any dysuria, hematuria or significant nocturia. Patient denies any problems walking, swelling in the joints or loss of balance. Patient denies any skin changes, loss of hair or loss of weight. Patient denies any excessive worrying or anxiety or significant depression. Patient denies any problems with insomnia. Patient denies excessive thirst, polyuria, polydipsia. Patient denies any swollen glands, patient denies easy bruising or easy bleeding. Patient denies any recent infections, allergies or URI.  Patient "s visual fields have not changed significantly in recent time.   PHYSICAL EXAM: Temp 98 F (36.7 C)   Resp 16   Ht 5\' 1"  (1.549 m)   Wt 126 lb 6.4 oz (57.3 kg)   BMI 23.88 kg/m patient does have a port placed in the right anterior chest Well-developed well-nourished patient in NAD. HEENT reveals PERLA, EOMI, discs not visualized.  Oral cavity is clear. No oral mucosal lesions are identified. Neck is clear without evidence of cervical or supraclavicular adenopathy. Lungs are clear to A&P. Cardiac examination is essentially unremarkable with regular rate and rhythm without murmur rub or thrill. Abdomen is benign with no organomegaly or masses noted. Motor sensory and DTR levels are equal  and symmetric in the upper and lower extremities. Cranial nerves II through XII are grossly intact. Proprioception is intact. No peripheral adenopathy or edema is identified. No motor or sensory levels are noted. Crude visual fields are within normal range.  LABORATORY DATA: Cytology reports reviewed compatible with above-stated findings    RADIOLOGY RESULTS: CT scans and PET CT scans reviewed compatible with above-stated findings   IMPRESSION: Stage IIIb based on PET/CT criteria non-small cell lung cancer of the left lung in 67 year old female with multiple prior malignancies.  PLAN: At this time I agreed to treat this is a stage III non-small cell lung cancer.  Would plan on delivering 31 Gray over 6 weeks using IMRT treatment planning and delivery.  I would choose IMRT to decrease side effects such as exposure to heart normal lung volume esophagus and spinal cord.  We will treat this with concurrent chemotherapy.  We will coordinate that with medical oncology.  There will be extra effort by both professional staff as well as technical staff to coordinate and manage concurrent chemoradiation and ensuing side effects during her treatments.  Risks and benefits of treatment including possibly increasing her cough possible mild dysphagia from radiation esophagitis skin reaction fatigue alteration of blood counts all were described in detail to the patient and her husband.  I have personally set up and ordered CT simulation for early next week.  Patient comprehends her treatment plan well.  Lung navigator was present during my discussions.  I would like to take this opportunity to thank you for allowing me to participate in the care of your patient.Carmina Miller, MD

## 2023-03-08 DIAGNOSIS — Z87891 Personal history of nicotine dependence: Secondary | ICD-10-CM | POA: Diagnosis not present

## 2023-03-08 DIAGNOSIS — Z853 Personal history of malignant neoplasm of breast: Secondary | ICD-10-CM | POA: Diagnosis not present

## 2023-03-08 DIAGNOSIS — C3412 Malignant neoplasm of upper lobe, left bronchus or lung: Secondary | ICD-10-CM | POA: Diagnosis not present

## 2023-03-09 ENCOUNTER — Other Ambulatory Visit: Payer: Self-pay | Admitting: Oncology

## 2023-03-09 ENCOUNTER — Ambulatory Visit: Payer: Self-pay | Admitting: *Deleted

## 2023-03-09 ENCOUNTER — Inpatient Hospital Stay: Admission: RE | Admit: 2023-03-09 | Payer: Medicare HMO | Source: Ambulatory Visit | Admitting: Radiation Oncology

## 2023-03-09 ENCOUNTER — Encounter: Payer: Self-pay | Admitting: Oncology

## 2023-03-09 ENCOUNTER — Other Ambulatory Visit: Payer: Self-pay | Admitting: Radiation Oncology

## 2023-03-09 ENCOUNTER — Ambulatory Visit
Admission: RE | Admit: 2023-03-09 | Discharge: 2023-03-09 | Disposition: A | Discharge status: Home / Self Care | Payer: Medicare HMO | Source: Ambulatory Visit | Attending: Oncology | Admitting: Oncology

## 2023-03-09 DIAGNOSIS — D225 Melanocytic nevi of trunk: Secondary | ICD-10-CM | POA: Diagnosis not present

## 2023-03-09 DIAGNOSIS — C3400 Malignant neoplasm of unspecified main bronchus: Secondary | ICD-10-CM | POA: Diagnosis not present

## 2023-03-09 DIAGNOSIS — C7491 Malignant neoplasm of unspecified part of right adrenal gland: Secondary | ICD-10-CM | POA: Diagnosis not present

## 2023-03-09 DIAGNOSIS — Z171 Estrogen receptor negative status [ER-]: Secondary | ICD-10-CM | POA: Diagnosis not present

## 2023-03-09 DIAGNOSIS — D2271 Melanocytic nevi of right lower limb, including hip: Secondary | ICD-10-CM | POA: Diagnosis not present

## 2023-03-09 DIAGNOSIS — C3492 Malignant neoplasm of unspecified part of left bronchus or lung: Secondary | ICD-10-CM

## 2023-03-09 DIAGNOSIS — L821 Other seborrheic keratosis: Secondary | ICD-10-CM | POA: Diagnosis not present

## 2023-03-09 DIAGNOSIS — D2262 Melanocytic nevi of left upper limb, including shoulder: Secondary | ICD-10-CM | POA: Diagnosis not present

## 2023-03-09 DIAGNOSIS — C801 Malignant (primary) neoplasm, unspecified: Secondary | ICD-10-CM

## 2023-03-09 DIAGNOSIS — R918 Other nonspecific abnormal finding of lung field: Secondary | ICD-10-CM

## 2023-03-09 DIAGNOSIS — C320 Malignant neoplasm of glottis: Secondary | ICD-10-CM

## 2023-03-09 DIAGNOSIS — D0461 Carcinoma in situ of skin of right upper limb, including shoulder: Secondary | ICD-10-CM | POA: Diagnosis not present

## 2023-03-09 DIAGNOSIS — C3490 Malignant neoplasm of unspecified part of unspecified bronchus or lung: Secondary | ICD-10-CM | POA: Insufficient documentation

## 2023-03-09 DIAGNOSIS — Z1231 Encounter for screening mammogram for malignant neoplasm of breast: Secondary | ICD-10-CM | POA: Diagnosis not present

## 2023-03-09 DIAGNOSIS — C44622 Squamous cell carcinoma of skin of right upper limb, including shoulder: Secondary | ICD-10-CM | POA: Diagnosis not present

## 2023-03-09 DIAGNOSIS — D0471 Carcinoma in situ of skin of right lower limb, including hip: Secondary | ICD-10-CM | POA: Diagnosis not present

## 2023-03-09 DIAGNOSIS — C50511 Malignant neoplasm of lower-outer quadrant of right female breast: Secondary | ICD-10-CM | POA: Diagnosis not present

## 2023-03-09 DIAGNOSIS — D2261 Melanocytic nevi of right upper limb, including shoulder: Secondary | ICD-10-CM | POA: Diagnosis not present

## 2023-03-09 DIAGNOSIS — D2272 Melanocytic nevi of left lower limb, including hip: Secondary | ICD-10-CM | POA: Diagnosis not present

## 2023-03-09 DIAGNOSIS — L570 Actinic keratosis: Secondary | ICD-10-CM | POA: Diagnosis not present

## 2023-03-09 DIAGNOSIS — D485 Neoplasm of uncertain behavior of skin: Secondary | ICD-10-CM | POA: Diagnosis not present

## 2023-03-09 MED ORDER — ONDANSETRON HCL 8 MG PO TABS: 8.0000 mg | ORAL_TABLET | Freq: Three times a day (TID) | ORAL | 1 refills | Status: DC | PRN

## 2023-03-09 MED ORDER — LIDOCAINE-PRILOCAINE 2.5-2.5 % EX CREA: TOPICAL_CREAM | CUTANEOUS | 3 refills | Status: DC

## 2023-03-09 MED ORDER — DEXAMETHASONE 4 MG PO TABS: ORAL_TABLET | ORAL | 1 refills | Status: DC

## 2023-03-09 MED ORDER — PROCHLORPERAZINE MALEATE 10 MG PO TABS: 10.0000 mg | ORAL_TABLET | Freq: Four times a day (QID) | ORAL | 1 refills | Status: DC | PRN

## 2023-03-09 NOTE — Telephone Encounter (Signed)
  Chief Complaint: Back pain on left side radiating down her leg into her foot.  Symptoms: above Frequency: Started yesterday Pertinent Negatives: Patient denies injuries Disposition: [] ED /[] Urgent Care (no appt availability in office) / [x] Appointment(In office/virtual)/ []  [facility]/ [] Home Care/ [] Refused Recommended Disposition /[] [facility]/ []  Follow-up with PCP Additional Notes: Appt made with Dr. Payton Mccallum for 1:00 on 03/10/2023.   She was at the pulmonary doctor's office and they called her to the exam room at the end of the call but she said she may have to call back and reschedule.   I let her know that would be fine.    Her calendar was at home.

## 2023-03-09 NOTE — Telephone Encounter (Signed)
Reason for Disposition  [1] MODERATE back pain (e.g., interferes with normal activities) AND [2] present > 3 days  Answer Assessment - Initial Assessment Questions 1. ONSET: "When did the pain begin?"      My pain started in  my left foot and goes up into my butt and lower back.   It's getting worse.     It started yesterday.  2. LOCATION: "Where does it hurt?" (upper, mid or lower back)     See above 3. SEVERITY: "How bad is the pain?"  (e.g., Scale 1-10; mild, moderate, or severe)   - MILD (1-3): Doesn't interfere with normal activities.    - MODERATE (4-7): Interferes with normal activities or awakens from sleep.    - SEVERE (8-10): Excruciating pain, unable to do any normal activities.      Moderate 4. PATTERN: "Is the pain constant?" (e.g., yes, no; constant, intermittent)      Constant 5. RADIATION: "Does the pain shoot into your legs or somewhere else?"     See above 6. CAUSE:  "What do you think is causing the back pain?"      I don't know 7. BACK OVERUSE:  "Any recent lifting of heavy objects, strenuous work or exercise?"     No  8. MEDICINES: "What have you taken so far for the pain?" (e.g., nothing, acetaminophen, NSAIDS)     Not asked 9. NEUROLOGIC SYMPTOMS: "Do you have any weakness, numbness, or problems with bowel/bladder control?"     Not asked 10. OTHER SYMPTOMS: "Do you have any other symptoms?" (e.g., fever, abdomen pain, burning with urination, blood in urine)       Not asked 11. PREGNANCY: "Is there any chance you are pregnant?" "When was your last menstrual period?"       N/A  Protocols used: Back Pain-A-AH

## 2023-03-10 ENCOUNTER — Ambulatory Visit
Admission: RE | Admit: 2023-03-10 | Discharge: 2023-03-10 | Disposition: A | Discharge status: Home / Self Care | Payer: Medicare HMO | Source: Ambulatory Visit | Attending: Family Medicine | Admitting: Family Medicine

## 2023-03-10 ENCOUNTER — Ambulatory Visit: Payer: Medicare HMO

## 2023-03-10 ENCOUNTER — Other Ambulatory Visit: Payer: Self-pay

## 2023-03-10 ENCOUNTER — Ambulatory Visit: Payer: Medicare HMO | Admitting: Family Medicine

## 2023-03-10 ENCOUNTER — Other Ambulatory Visit: Payer: Medicare PPO

## 2023-03-10 ENCOUNTER — Encounter: Payer: Self-pay | Admitting: Family Medicine

## 2023-03-10 ENCOUNTER — Encounter: Payer: Self-pay | Admitting: Oncology

## 2023-03-10 ENCOUNTER — Ambulatory Visit
Admission: RE | Admit: 2023-03-10 | Discharge: 2023-03-10 | Disposition: A | Discharge status: Home / Self Care | Payer: Medicare HMO | Attending: Family Medicine | Admitting: Family Medicine

## 2023-03-10 VITALS — BP 119/67 | HR 96 | Resp 16 | Ht 61.0 in | Wt 129.0 lb

## 2023-03-10 DIAGNOSIS — M47816 Spondylosis without myelopathy or radiculopathy, lumbar region: Secondary | ICD-10-CM | POA: Diagnosis not present

## 2023-03-10 DIAGNOSIS — M4316 Spondylolisthesis, lumbar region: Secondary | ICD-10-CM | POA: Diagnosis not present

## 2023-03-10 DIAGNOSIS — M62830 Muscle spasm of back: Secondary | ICD-10-CM | POA: Diagnosis not present

## 2023-03-10 DIAGNOSIS — M545 Low back pain, unspecified: Secondary | ICD-10-CM

## 2023-03-10 DIAGNOSIS — I700 Atherosclerosis of aorta: Secondary | ICD-10-CM | POA: Diagnosis not present

## 2023-03-10 DIAGNOSIS — N182 Chronic kidney disease, stage 2 (mild): Secondary | ICD-10-CM | POA: Diagnosis not present

## 2023-03-10 DIAGNOSIS — M47814 Spondylosis without myelopathy or radiculopathy, thoracic region: Secondary | ICD-10-CM | POA: Diagnosis not present

## 2023-03-10 MED ORDER — PREDNISONE 50 MG PO TABS
50.0000 mg | ORAL_TABLET | Freq: Every day | ORAL | 0 refills | Status: DC
Start: 2023-03-10 — End: 2023-04-12

## 2023-03-10 MED ORDER — LIDOCAINE 5 % EX PTCH: 1.0000 | MEDICATED_PATCH | CUTANEOUS | 0 refills | Status: DC

## 2023-03-10 NOTE — Progress Notes (Signed)
Established patient visit   Patient: Denise Watts   DOB: [birth year]   67 y.o. Female  MRN: [id number] Visit Date: 03/10/2023  Today's healthcare provider: Sherlyn Hay, DO   Chief Complaint  Patient presents with   Back Pain    Patient has pain in her lower back that runs down her left leg    Subjective    HPI Back pain,    The patient, with a history of laryngeal cancer, breast cancer, and lung cancer, presents with a new onset of back pain that started a couple of days ago. The pain initially began in the lower back and has since progressed upwards. The patient has been using oxycodone and meloxicam for pain management, but these have not provided significant relief for the current back pain.  The patient's back pain is exacerbated by certain movements, including bending over and raising the arms. The pain is described as being tender and extends from the middle of the back to the buttocks. The patient also reports leg pain that seems to originate from the lower back and move upwards.  - The patient also reports a sensation of stretching in the lower extremities when lying down for extended periods, which has not been previously experienced.  The patient also reports a deep cough that has been ongoing for about a week, which is associated with hot flashes and chills. These symptoms have worsened recently. The patient also mentions a sore throat, which has been causing difficulty in eating and necessitating a diet of soft foods for about a month.  The patient has been experiencing dental issues, with loose teeth causing irritation when eating. This has been ongoing for about a month and is attributed as cause of recent weight loss.  The patient has been undergoing various tests and procedures for her cancer diagnoses, including a bronchoscopy and the placement of a port for chemotherapy. The patient is due to start chemotherapy soon, but the exact date is not  specified.    Medications: Outpatient Medications Prior to Visit  Medication Sig   aspirin EC 81 MG tablet Take 1 tablet (81 mg total) by mouth daily. Swallow whole.   benzonatate (TESSALON) 100 MG capsule Take 2 capsules (200 mg total) by mouth 2 (two) times daily as needed for cough.   buPROPion (WELLBUTRIN XL) 150 MG 24 hr tablet Take 3 tablets (450 mg total) by mouth daily.   busPIRone (BUSPAR) 5 MG tablet Take 1 tablet (5 mg total) by mouth 3 (three) times daily.   ciprofloxacin (CIPRO) 250 MG tablet Take 1 tablet (250 mg total) by mouth 2 (two) times daily.   cyclobenzaprine (FLEXERIL) 5 MG tablet TAKE 1 TABLET BY MOUTH AT BEDTIME   dexamethasone (DECADRON) 4 MG tablet Take 2 tablets daily for 2 days, start the day after chemotherapy. Take with food.   DULoxetine (CYMBALTA) 60 MG capsule Take 1 capsule (60 mg total) by mouth 2 (two) times daily.   lidocaine-prilocaine (EMLA) cream Apply to affected area once   meloxicam (MOBIC) 7.5 MG tablet TAKE 1 TABLET BY MOUTH DAILY   montelukast (SINGULAIR) 10 MG tablet Take 1 tablet (10 mg total) by mouth at bedtime.   omeprazole (PRILOSEC) 20 MG capsule Take 1 capsule (20 mg total) by mouth 2 (two) times daily before a meal.   ondansetron (ZOFRAN) 8 MG tablet Take 1 tablet (8 mg total) by mouth every 8 (eight) hours as needed for nausea or vomiting. Start on  the third day after chemotherapy.   oxyCODONE (ROXICODONE) 5 MG immediate release tablet Take 1 tablet (5 mg total) by mouth every 8 (eight) hours as needed for severe pain or moderate pain.   prochlorperazine (COMPAZINE) 10 MG tablet Take 1 tablet (10 mg total) by mouth every 6 (six) hours as needed for nausea or vomiting.   rOPINIRole (REQUIP) 0.25 MG tablet TAKE 1 TABLET BY MOUTH AT BEDTIME   rosuvastatin (CRESTOR) 40 MG tablet TAKE 1 TABLET BY MOUTH DAILY   tolterodine (DETROL LA) 4 MG 24 hr capsule Take 1 capsule (4 mg total) by mouth daily.   Facility-Administered Medications Prior to  Visit  Medication Dose Route Frequency Provider   cyanocobalamin ((VITAMIN B-12)) injection 1,000 mcg  1,000 mcg Intramuscular Q30 days Creig Hines, MD   cyanocobalamin (VITAMIN B12) injection 1,000 mcg  1,000 mcg Intramuscular Q30 days Creig Hines, MD         Objective    BP 119/67   Pulse 96   Resp 16   Ht 5\' 1"  (1.549 m)   Wt 129 lb (58.5 kg)   SpO2 97%   BMI 24.37 kg/m     Physical Exam Vitals and nursing note reviewed.  Constitutional:      General: She is not in acute distress.    Appearance: Normal appearance.  HENT:     Head: Normocephalic and atraumatic.  Eyes:     General: No scleral icterus.    Conjunctiva/sclera: Conjunctivae normal.  Cardiovascular:     Rate and Rhythm: Normal rate.  Pulmonary:     Effort: Pulmonary effort is normal.  Musculoskeletal:     Lumbar back: Spasms and tenderness (paraspinal) present. No bony tenderness.       Back:       Legs:     Comments: Pain initially up leg from lateral aspect of left leg, less so now  Neurological:     Mental Status: She is alert and oriented to person, place, and time. Mental status is at baseline.  Psychiatric:        Mood and Affect: Mood normal.        Behavior: Behavior normal.      No results found for any visits on 03/10/23.  Assessment & Plan    Spasm of thoracic back muscle Assessment & Plan: Acute onset of back pain radiating from lower to upper back over the past few days. No acute injury reported. Pain exacerbated by movement and palpation. Patient has been using oxycodone and Flexeril with minimal relief.  -Order back X-ray to rule out any underlying pathology. -Prescribe short course of Prednisone (3 tablets) to address inflammation. -Prescribe Lidocaine patches for topical pain relief. -Provide patient with gentle thoracic stretches to help alleviate pain.  Orders: -     Lidocaine; Place 1 patch onto the skin daily. Remove & Discard patch within 12 hours or as directed  by MD  Dispense: 30 patch; Refill: 0 -     predniSONE; Take 1 tablet (50 mg total) by mouth daily with breakfast.  Dispense: 3 tablet; Refill: 0 -     DG Thoracic Spine 2 View  Lumbar back pain -     DG Lumbar Spine 2-3 Views  Chronic kidney disease, stage 2, mildly decreased GFR Assessment & Plan: Noted. No acute concerns.  Continue to monitor.   Acute Cough New onset of deep cough over the past week, concurrent with back pain. No clear cause identified. -Continue to monitor  symptoms.  Lung Cancer History of lung cancer with recent bronchoscopy and port placement for chemotherapy. Patient reports sore throat and difficulty eating due to pain. -Continue current cancer treatment plan under oncology. -Advise patient to start Decadron the day after chemotherapy as per instructions.   Return if symptoms worsen or fail to improve.      I discussed the assessment and treatment plan with the patient  The patient was provided an opportunity to ask questions and all were answered. The patient agreed with the plan and demonstrated an understanding of the instructions.   The patient was advised to call back or seek an in-person evaluation if the symptoms worsen or if the condition fails to improve as anticipated.  Total time was 35  minutes. That includes chart review before the visit, the actual patient visit, and time spent on documentation after the visit.    Sherlyn Hay, DO  [facility] [phone] (phone) [phone] (fax)  [facility]

## 2023-03-10 NOTE — Patient Instructions (Signed)
On February 09, 2023, [employer]. initiated a voluntary recall of 7,107 bottles of duloxetine (Cymbalta) due to elevated levels of N-nitroso-duloxetine, a chemical that may be carcinogenic:    Affected bottles The recall covers 500-count bottles of 20 mg delayed-release capsules with lot number [id number] and an expiration date of December 2024.

## 2023-03-14 ENCOUNTER — Ambulatory Visit: Payer: Medicare HMO

## 2023-03-14 ENCOUNTER — Encounter: Payer: Self-pay | Admitting: Oncology

## 2023-03-15 ENCOUNTER — Ambulatory Visit
Admission: RE | Admit: 2023-03-15 | Discharge: 2023-03-15 | Disposition: A | Discharge status: Home / Self Care | Payer: Medicare HMO | Source: Ambulatory Visit | Attending: Radiation Oncology | Admitting: Radiation Oncology

## 2023-03-15 DIAGNOSIS — Z853 Personal history of malignant neoplasm of breast: Secondary | ICD-10-CM | POA: Diagnosis not present

## 2023-03-15 DIAGNOSIS — Z923 Personal history of irradiation: Secondary | ICD-10-CM | POA: Diagnosis not present

## 2023-03-15 DIAGNOSIS — Z5111 Encounter for antineoplastic chemotherapy: Secondary | ICD-10-CM | POA: Diagnosis not present

## 2023-03-15 DIAGNOSIS — Z510 Encounter for antineoplastic radiation therapy: Secondary | ICD-10-CM | POA: Diagnosis not present

## 2023-03-15 DIAGNOSIS — Z8521 Personal history of malignant neoplasm of larynx: Secondary | ICD-10-CM | POA: Diagnosis not present

## 2023-03-15 DIAGNOSIS — Z87891 Personal history of nicotine dependence: Secondary | ICD-10-CM | POA: Diagnosis not present

## 2023-03-15 DIAGNOSIS — C3412 Malignant neoplasm of upper lobe, left bronchus or lung: Secondary | ICD-10-CM | POA: Diagnosis not present

## 2023-03-15 DIAGNOSIS — C801 Malignant (primary) neoplasm, unspecified: Secondary | ICD-10-CM

## 2023-03-17 ENCOUNTER — Ambulatory Visit: Payer: Medicare PPO | Admitting: Oncology

## 2023-03-17 DIAGNOSIS — Z8521 Personal history of malignant neoplasm of larynx: Secondary | ICD-10-CM | POA: Diagnosis not present

## 2023-03-17 DIAGNOSIS — Z923 Personal history of irradiation: Secondary | ICD-10-CM | POA: Diagnosis not present

## 2023-03-17 DIAGNOSIS — Z510 Encounter for antineoplastic radiation therapy: Secondary | ICD-10-CM | POA: Diagnosis not present

## 2023-03-17 DIAGNOSIS — Z853 Personal history of malignant neoplasm of breast: Secondary | ICD-10-CM | POA: Diagnosis not present

## 2023-03-17 DIAGNOSIS — C3412 Malignant neoplasm of upper lobe, left bronchus or lung: Secondary | ICD-10-CM | POA: Diagnosis not present

## 2023-03-17 DIAGNOSIS — Z87891 Personal history of nicotine dependence: Secondary | ICD-10-CM | POA: Diagnosis not present

## 2023-03-17 DIAGNOSIS — Z5111 Encounter for antineoplastic chemotherapy: Secondary | ICD-10-CM | POA: Diagnosis not present

## 2023-03-20 ENCOUNTER — Inpatient Hospital Stay: Payer: Medicare HMO

## 2023-03-20 ENCOUNTER — Inpatient Hospital Stay: Payer: Medicare HMO | Attending: Oncology

## 2023-03-20 ENCOUNTER — Inpatient Hospital Stay (HOSPITAL_BASED_OUTPATIENT_CLINIC_OR_DEPARTMENT_OTHER): Payer: Medicare HMO | Admitting: Oncology

## 2023-03-20 ENCOUNTER — Encounter: Payer: Self-pay | Admitting: Oncology

## 2023-03-20 VITALS — BP 133/88 | HR 88 | Temp 98.4°F | Resp 18

## 2023-03-20 VITALS — BP 127/73 | HR 88 | Temp 98.7°F | Wt 126.0 lb

## 2023-03-20 DIAGNOSIS — C3412 Malignant neoplasm of upper lobe, left bronchus or lung: Secondary | ICD-10-CM | POA: Insufficient documentation

## 2023-03-20 DIAGNOSIS — Z5111 Encounter for antineoplastic chemotherapy: Secondary | ICD-10-CM | POA: Diagnosis not present

## 2023-03-20 DIAGNOSIS — Z87891 Personal history of nicotine dependence: Secondary | ICD-10-CM | POA: Insufficient documentation

## 2023-03-20 DIAGNOSIS — Z853 Personal history of malignant neoplasm of breast: Secondary | ICD-10-CM | POA: Insufficient documentation

## 2023-03-20 DIAGNOSIS — C3492 Malignant neoplasm of unspecified part of left bronchus or lung: Secondary | ICD-10-CM

## 2023-03-20 DIAGNOSIS — Z923 Personal history of irradiation: Secondary | ICD-10-CM | POA: Diagnosis not present

## 2023-03-20 DIAGNOSIS — Z8521 Personal history of malignant neoplasm of larynx: Secondary | ICD-10-CM | POA: Insufficient documentation

## 2023-03-20 DIAGNOSIS — Z510 Encounter for antineoplastic radiation therapy: Secondary | ICD-10-CM | POA: Diagnosis not present

## 2023-03-20 LAB — CBC WITH DIFFERENTIAL (CANCER CENTER ONLY)
Abs Immature Granulocytes: 0.02 10*3/uL (ref 0.00–0.07)
Basophils Absolute: 0 10*3/uL (ref 0.0–0.1)
Basophils Relative: 1 %
Eosinophils Absolute: 0.3 10*3/uL (ref 0.0–0.5)
Eosinophils Relative: 5 %
HCT: 43 % (ref 36.0–46.0)
Hemoglobin: 14 g/dL (ref 12.0–15.0)
Immature Granulocytes: 0 %
Lymphocytes Relative: 16 %
Lymphs Abs: 1.1 10*3/uL (ref 0.7–4.0)
MCH: 32.4 pg (ref 26.0–34.0)
MCHC: 32.6 g/dL (ref 30.0–36.0)
MCV: 99.5 fL (ref 80.0–100.0)
Monocytes Absolute: 0.5 10*3/uL (ref 0.1–1.0)
Monocytes Relative: 8 %
Neutro Abs: 4.5 10*3/uL (ref 1.7–7.7)
Neutrophils Relative %: 70 %
Platelet Count: 186 10*3/uL (ref 150–400)
RBC: 4.32 MIL/uL (ref 3.87–5.11)
RDW: 12.1 % (ref 11.5–15.5)
WBC Count: 6.5 10*3/uL (ref 4.0–10.5)
nRBC: 0 % (ref 0.0–0.2)

## 2023-03-20 LAB — CMP (CANCER CENTER ONLY)
ALT: 31 U/L (ref 0–44)
AST: 32 U/L (ref 15–41)
Albumin: 3.8 g/dL (ref 3.5–5.0)
Alkaline Phosphatase: 73 U/L (ref 38–126)
Anion gap: 9 (ref 5–15)
BUN: 19 mg/dL (ref 8–23)
CO2: 25 mmol/L (ref 22–32)
Calcium: 8.8 mg/dL — ABNORMAL LOW (ref 8.9–10.3)
Chloride: 106 mmol/L (ref 98–111)
Creatinine: 0.83 mg/dL (ref 0.44–1.00)
GFR, Estimated: >60 mL/min (ref >60)
Glucose, Bld: 99 mg/dL (ref 70–99)
Potassium: 3.8 mmol/L (ref 3.5–5.1)
Sodium: 140 mmol/L (ref 135–145)
Total Bilirubin: 0.6 mg/dL (ref <1.2)
Total Protein: 6.3 g/dL — ABNORMAL LOW (ref 6.5–8.1)

## 2023-03-20 MED ORDER — SODIUM CHLORIDE 0.9 % IV SOLN
10.0000 mg | Freq: Once | INTRAVENOUS | Status: DC
Start: 2023-03-20 — End: 2023-03-20

## 2023-03-20 MED ORDER — DEXAMETHASONE SODIUM PHOSPHATE 10 MG/ML IJ SOLN
10.0000 mg | Freq: Once | INTRAMUSCULAR | Status: AC
  Administered 2023-03-20: 10 mg via INTRAVENOUS
  Filled 2023-03-20: qty 1

## 2023-03-20 MED ORDER — FAMOTIDINE IN NACL 20-0.9 MG/50ML-% IV SOLN
20.0000 mg | Freq: Once | INTRAVENOUS | Status: AC
  Administered 2023-03-20: 20 mg via INTRAVENOUS
  Filled 2023-03-20: qty 50

## 2023-03-20 MED ORDER — SODIUM CHLORIDE 0.9 % IV SOLN: 183.4000 mg | Freq: Once | INTRAVENOUS | Status: DC

## 2023-03-20 MED ORDER — HEPARIN SOD (PORK) LOCK FLUSH 100 UNIT/ML IV SOLN
500.0000 [IU] | Freq: Once | INTRAVENOUS | Status: AC | PRN
  Administered 2023-03-20: 500 [IU]
  Filled 2023-03-20: qty 5

## 2023-03-20 MED ORDER — SODIUM CHLORIDE 0.9 % IV SOLN
50.0000 mg/m2 | Freq: Once | INTRAVENOUS | Status: AC
  Administered 2023-03-20: 78 mg via INTRAVENOUS
  Filled 2023-03-20: qty 13

## 2023-03-20 MED ORDER — SODIUM CHLORIDE 0.9 % IV SOLN
146.0000 mg | Freq: Once | INTRAVENOUS | Status: AC
  Administered 2023-03-20: 150 mg via INTRAVENOUS
  Filled 2023-03-20: qty 15

## 2023-03-20 MED ORDER — METHYLPREDNISOLONE SODIUM SUCC 125 MG IJ SOLR
40.0000 mg | Freq: Once | INTRAMUSCULAR | Status: AC
  Administered 2023-03-20: 40 mg via INTRAVENOUS

## 2023-03-20 MED ORDER — SODIUM CHLORIDE 0.9 % IV SOLN
80.0000 mg/m2 | Freq: Once | INTRAVENOUS | Status: DC
Start: 2023-03-20 — End: 2023-03-20

## 2023-03-20 MED ORDER — PALONOSETRON HCL INJECTION 0.25 MG/5ML
0.2500 mg | Freq: Once | INTRAVENOUS | Status: AC
  Administered 2023-03-20: 0.25 mg via INTRAVENOUS
  Filled 2023-03-20: qty 5

## 2023-03-20 MED ORDER — DIPHENHYDRAMINE HCL 50 MG/ML IJ SOLN
50.0000 mg | Freq: Once | INTRAMUSCULAR | Status: AC
Start: 2023-03-20 — End: 2023-03-20
  Administered 2023-03-20: 50 mg via INTRAVENOUS
  Filled 2023-03-20: qty 1

## 2023-03-20 MED ORDER — SODIUM CHLORIDE 0.9 % IV SOLN
INTRAVENOUS | Status: DC
  Filled 2023-03-20: qty 250

## 2023-03-20 NOTE — Progress Notes (Signed)
1038: Patient started on taxol at 66 ml/hr for 15 minutes 1055: Patient rate increased to 132 ml/hr 1110: B/P increased to 158/81. Pt denies any s/s and no s/s of distress/adverse reaction noted. Dr. Smith Robert aware.  1121: Per Dr/ Smith Robert okay to proceed with Taxol titrations.  1132: Taxol paused due to redness on palms. Smith Robert, notified and ordered to give 40 mg of solumedrol, wait 15 minutes, and proceed at second rate.  1135:BP 143/67 1141: 40 mg iv solumedrol given 1156: Taxol restarted at second rate (132 ml/hr) 1211: After 15 minutes of 132 ml/hr, bp 128/63 and palms no longer red. Smith Robert, MD notified. Per Smith Robert, MD continue at 132 ml/hr for remainder of bag 1350: Taxol infusion completed with vitals stable 1446: Patient finished carboplatin, port was heparin locked, patient stable at discharge

## 2023-03-20 NOTE — Progress Notes (Signed)
Taxol reduced per Dr Smith Robert for lung dosing.  Ebony Hail, Pharm.D., CPP 03/20/2023@9 :27 AM

## 2023-03-20 NOTE — Patient Instructions (Signed)
 [facility] - A DEPT OF [facility]  Discharge Instructions: Thank you for choosing [facility] to provide your oncology and hematology care.  If you have a lab appointment with the Cancer Center, please go directly to the Cancer Center and check in at the registration area.  Wear comfortable clothing and clothing appropriate for easy access to any Portacath or PICC line.   We strive to give you quality time with your provider. You may need to reschedule your appointment if you arrive late (15 or more minutes).  Arriving late affects you and other patients whose appointments are after yours.  Also, if you miss three or more appointments without notifying the office, you may be dismissed from the clinic at the provider's discretion.      For prescription refill requests, have your pharmacy contact our office and allow 72 hours for refills to be completed.    Today you received the following chemotherapy and/or immunotherapy agents taxol and carboplatin      To help prevent nausea and vomiting after your treatment, we encourage you to take your nausea medication as directed.  BELOW ARE SYMPTOMS THAT SHOULD BE REPORTED IMMEDIATELY: *FEVER GREATER THAN 100.4 F (38 C) OR HIGHER *CHILLS OR SWEATING *NAUSEA AND VOMITING THAT IS NOT CONTROLLED WITH YOUR NAUSEA MEDICATION *UNUSUAL SHORTNESS OF BREATH *UNUSUAL BRUISING OR BLEEDING *URINARY PROBLEMS (pain or burning when urinating, or frequent urination) *BOWEL PROBLEMS (unusual diarrhea, constipation, pain near the anus) TENDERNESS IN MOUTH AND THROAT WITH OR WITHOUT PRESENCE OF ULCERS (sore throat, sores in mouth, or a toothache) UNUSUAL RASH, SWELLING OR PAIN  UNUSUAL VAGINAL DISCHARGE OR ITCHING   Items with * indicate a potential emergency and should be followed up as soon as possible or go to the Emergency Department if any problems should occur.  Please show the CHEMOTHERAPY ALERT CARD or  IMMUNOTHERAPY ALERT CARD at check-in to the Emergency Department and triage nurse.  Should you have questions after your visit or need to cancel or reschedule your appointment, please contact [facility] - A DEPT OF [facility]  [phone] and follow the prompts.  Office hours are 8:00 a.m. to 4:30 p.m. Monday - Friday. Please note that voicemails left after 4:00 p.m. may not be returned until the following business day.  We are closed weekends and major holidays. You have access to a nurse at all times for urgent questions. Please call the main number to the clinic [phone] and follow the prompts.  For any non-urgent questions, you may also contact your provider using MyChart. We now offer e-Visits for anyone 87 and older to request care online for non-urgent symptoms. For details visit [email or web address].   Also download the MyChart app! Go to the app store, search "MyChart", open the app, select [facility], and log in with your MyChart username and password.

## 2023-03-20 NOTE — Progress Notes (Signed)
Per MD: okay to update Carbo dosing to use current Scr and formula   Sharen Hones, PharmD, BCPS Clinical Pharmacist

## 2023-03-20 NOTE — Progress Notes (Signed)
Hematology/Oncology Consult note [facility]  [phone] [phone]  Patient Care Team: Jacky Kindle, FNP as PCP - General (Family Medicine) Debbe Odea, MD as PCP - Cardiology (Cardiology) Creig Hines, MD as Consulting Physician (Oncology) Leafy Ro, MD as Consulting Physician (General Surgery) Dasher, Cliffton Asters, MD (Dermatology) Scheeler, Kermit Balo, PA-C as Physician Assistant (Plastic Surgery) Minna Antis, MD as Attending Physician (Emergency Medicine) Alvira Philips, Leitha Bleak (Neurology) Dayna Barker, MD as Referring Physician (Internal Medicine) Coralyn Helling, MD (Inactive) as Consulting Physician (Pulmonary Disease) Gaspar Cola, RPH (Inactive) (Pharmacist) Pa, [facility] (Optometry) Glory Buff, RN as Oncology Nurse Navigator Carmina Miller, MD as Consulting Physician (Radiation Oncology)   Name of the patient: Yaqueline Kurihara  [id number]  Jul 20, 1955   Date of visit: 03/20/23  Diagnosis-non-small cell carcinoma of unknown primary  Chief complaint/ Reason for visit-on treatment assessment prior to cycle 1 of weekly CarboTaxol chemotherapy  Heme/Onc history:  Oncology History Overview Note  1. Carcinoma of breast, T1, N0, M0 tumor diagnosis.  In January of 1997 2. Carcinoma of the vocal cord T1, N0, M0 tumor.  Status postradiation therapy 3. Abnormal CT scan of the chest (December, 2015) 4. Repeat CT scan of chest shows stable nodule (July, 2016) 5. Neutropenia with normal hemoglobin and normal platelet count (July, 2016)   Cancer of vocal cord [facility]) (Resolved)  11/10/2014 Initial Diagnosis   Cancer of vocal cord   Malignant neoplasm of lower-outer quadrant of right breast of female, estrogen receptor negative (HCC)  11/24/2017 Initial Diagnosis   Malignant neoplasm of lower-outer quadrant of right breast of female, estrogen receptor negative (HCC)   01/12/2018 Cancer Staging    Staging form: Breast, AJCC 8th Edition - Clinical stage from 01/12/2018: Stage IB (cT1a, cN0, cM0, G3, ER+, PR-, HER2-) - Signed by Creig Hines, MD on 01/12/2018   01/30/2018 - 04/26/2018 Chemotherapy   The patient had dexamethasone (DECADRON) 4 MG tablet, 8 mg, Oral, 2 times daily, 1 of 1 cycle, Start date: 01/12/2018, End date: 08/03/2018 palonosetron (ALOXI) injection 0.25 mg, 0.25 mg, Intravenous,  Once, 4 of 4 cycles Administration: 0.25 mg (01/30/2018), 0.25 mg (02/27/2018), 0.25 mg (03/28/2018), 0.25 mg (04/26/2018) pegfilgrastim (NEULASTA ONPRO KIT) injection 6 mg, 6 mg, Subcutaneous, Once, 4 of 4 cycles Administration: 6 mg (01/30/2018), 6 mg (02/27/2018), 6 mg (03/28/2018), 6 mg (04/26/2018) cyclophosphamide (CYTOXAN) 1,000 mg in sodium chloride 0.9 % 250 mL chemo infusion, 600 mg/m2 = 1,000 mg, Intravenous,  Once, 4 of 4 cycles Administration: 1,000 mg (01/30/2018), 1,000 mg (02/27/2018), 1,000 mg (03/28/2018), 1,000 mg (04/26/2018) DOCEtaxel (TAXOTERE) 130 mg in sodium chloride 0.9 % 250 mL chemo infusion, 75 mg/m2 = 130 mg, Intravenous,  Once, 4 of 4 cycles Dose modification: 60 mg/m2 (original dose 75 mg/m2, Cycle 2, Reason: Dose not tolerated) Administration: 130 mg (01/30/2018), 100 mg (02/27/2018), 100 mg (03/28/2018), 100 mg (04/26/2018)  for chemotherapy treatment.    Non-small cell lung cancer (HCC)  03/09/2023 Initial Diagnosis   Non-small cell lung cancer (HCC)   03/20/2023 -  Chemotherapy   Patient is on Treatment Plan : LUNG Carboplatin + Paclitaxel q7d       The patient was originally diagnosed with right breast cancer in 1997, 10 mm grade 3, ER pr positive her 2 negative, node negative status post surgery and radiation treatment only. She never received chemotherapy or hormone therapy. For her vocal cord cancer, she had extensive surgery and radiation therapy in 1998.  Due to past history of smoking, she was also noted to have lung nodules. She is undergoing active  surveillance imaging study of the chest.   Patient noted to have adrenal mass on CT chest which prompted PET CT in July 2018 which showed: IMPRESSION: 1. Right adrenal mass with peripheral hypermetabolism. Given interval development since 11/18/2015, suspicious for either isolated metastasis or a primary adrenal neoplasm. This should be considered for biopsy. 2. No evidence of hypermetabolic primary malignancy or extraadrenal metastasis. Pulmonary nodules are not significantly hypermetabolic. 3. Coronary artery atherosclerosis. Aortic Atherosclerosis (ICD10-I70.0). 4. Hepatic steatosis. 5. Left sixth rib hypermetabolism is favored to be related to remote Trauma.   Patient underwent right adrenalectomy which showed: DIAGNOSIS:  A. ADRENAL GLAND, RIGHT; ADRENALECTOMY:  - HIGH GRADE CARCINOMA WITH EXTENSIVE NECROSIS.  - MARGINS OF THE SPECIMEN ARE NEGATIVE FOR CARCINOMA.   Comment:  Sections demonstrate abundant necrosis confined to the adrenal medullary  compartment. In one block of tissue a small fragment of viable malignant  neoplasm was identified. A panel of immunohistochemical stains was  performed with the following pattern of immunoreactivity:  Super pancytokeratin: Positive  GATA-3: Negative  TTF-1: Negative  P40: Negative  Napsin: Negative  PAX-8: Negative  Chromogranin: Negative     Repeat CT chest abdomen and pelvis in July 2019 showed stable left lower lobe pulmonary nodule.  No other evidence of metastatic disease.  New 4.5 mm nodule in the right breast.  This was followed by ultrasound mammogram and core biopsy of the breast lesion which was a grade 3 triple negative breast cancer.  Patient was seen by Dr. Lemar Livings and underwent lumpectomy on 12/08/2017.   Patient had a 5 mm weakly ER positive tumor.  Given her ongoing fatigue Oncotype testing was done to see if she could potentially avoid chemotherapy.  Oncotype shows a recurrence score of 53 with her risk of distant  recurrence at 9 years at greater than 39% with hormone therapy alone.  Absolute benefit of chemotherapy was greater than 15% in her age group.  Patient completed 4 cycles of adjuvant TC chemotherapy in December 2019    Patient has known bilateral pulmonary nodules which have been monitored and she was again found to have a left lower lobe lung nodule measuring 13 mm unchanged as compared to prior exams.  There were also new subcentimeter lung nodules detected and follow-up CT was recommended.  CT angio chest in August 2024 showed increase in the size of left upper lobe lung nodule and new multiple pulmonary nodules concerning for metastatic disease.  This was followed by a PET CT scan which showed hypermetabolic intrathoracic nodal adenopathy measuring 5 to 13 mm in size along with lung nodules with a maximum size measuring 13 mm with an SUV of 9.2.   CT-guided biopsy was nondiagnostic she is patient underwent bronchoscopy but lymph nodes were not sampled at that time due to equipment malfunction.Bronchial lavage from left upper lobe was positive for malignancy non-small cell carcinoma.  But no further subclassification whether this is primary lung cancer or metastatic carcinoma could be done due to limited specimen. EBUS station 7 LN also shows non small cell carcinoma    Interval history-patient has chronic fatigue.  Denies any cough or exertional shortness of breath  ECOG PS- 2 Pain scale- 3 Opioid associated constipation- no  Review of systems- Review of Systems  Constitutional:  Positive for malaise/fatigue. Negative for chills, fever and weight loss.  HENT:  Negative for congestion, ear discharge and  nosebleeds.   Eyes:  Negative for blurred vision.  Respiratory:  Negative for cough, hemoptysis, sputum production, shortness of breath and wheezing.   Cardiovascular:  Negative for chest pain, palpitations, orthopnea and claudication.  Gastrointestinal:  Negative for abdominal pain, blood in  stool, constipation, diarrhea, heartburn, melena, nausea and vomiting.  Genitourinary:  Negative for dysuria, flank pain, frequency, hematuria and urgency.  Musculoskeletal:  Positive for back pain and myalgias. Negative for joint pain.  Skin:  Negative for rash.  Neurological:  Negative for dizziness, tingling, focal weakness, seizures, weakness and headaches.  Endo/Heme/Allergies:  Does not bruise/bleed easily.  Psychiatric/Behavioral:  Negative for depression and suicidal ideas. The patient does not have insomnia.       Allergies  Allergen Reactions   Sulfa Antibiotics Other (See Comments)    Mouth sore and raw     Past Medical History:  Diagnosis Date   Adenomatous colon polyp    Adrenal carcinoma, right (HCC) 12/28/2016   a.) high grade carcinoma with extensive necrosis --> s/p adrenalectomy   Anxiety    Aortic atherosclerosis (HCC)    Arthritis of right knee    Atrial fibrillation and flutter (HCC)    a.) CHA2DS2-VASc = 3 (age, sex, vascular disease history) as of 02/13/2023; b.) cardiac rate/rhythm maintained intrinsically without pharmacological intervention; no chronic OAC (does take low dose ASA)   B12 deficiency    Breast cancer (HCC) 1997   a.) stage IB (cT1a, cN0, cM0, G3, ER+, PR-, HER2-); s/p lumpectomy + XRT   COPD (chronic obstructive pulmonary disease) (HCC)    Coronary artery disease    a.) cCTA 12/09/2021: Ca2+ = 135 (80th %'ile for age/sex/race match contol) --> 25-49% pLAD and RCA distributions   Depression    Diastolic dysfunction    a.) TTE 12/11/2020, no RWMAs, norm RVSF, G1DD; b.) TTE 12/14/2021: EF 55-60, no RWMAs, norm RVSF, G1DD; c.) TTE 01/27/2023: EF 50-55%, no RWMAs, G1DD, mild RAE, mild MR, mod TR   Dry cough 03/02/2017   Dyspnea    Full dentures    GERD (gastroesophageal reflux disease)    History of hiatal hernia    HLD (hyperlipidemia)    Invasive carcinoma of breast (HCC) 11/15/2017   a.) path (+) for G3, ER -, PR -, HER2/neu - ; s/p  lumpectomy 01/17/2018 + 4 cycles TC systemic chemotherapy + XRT   Long term current use of aspirin    Personal history of chemotherapy    Personal history of radiation therapy    Pneumothorax of left lung after biopsy 01/25/2023   a.) hypoxic in PACU requiring re-intubation --> transferred to ICU --> chest tube placed by PCCM   RLS (restless legs syndrome)    a.) on ropinirole   Skin cancer    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   a.) Stage IB (cT1a, cN0, cM0, G3, ER+, PR-, HER2-)     Past Surgical History:  Procedure Laterality Date   BIOPSY  01/05/2023   Procedure: BIOPSY;  Surgeon: Toney Reil, MD;  Location: [facility];  Service: Gastroenterology;;   BREAST BIOPSY Right 1997   positive   BREAST BIOPSY Right 11/15/2017   INVASIVE MAMMARY CARCINOMA triple negative   BREAST BIOPSY Right 07/20/2020   [place] bx, Q marker, neg   BREAST LUMPECTOMY Right 1997   2019 also   BREAST LUMPECTOMY WITH SENTINEL LYMPH NODE BIOPSY Right 12/08/2017   Procedure: BREAST LUMPECTOMY WITH SENTINEL LYMPH NODE BX;  Surgeon: Earline Mayotte, MD;  Location: [facility];  Service: General;  Laterality: Right;   BREAST REDUCTION WITH MASTOPEXY Left 06/10/2019   Procedure: Left breast mastopexy reduction for symmetry;  Surgeon: Peggye Form, DO;  Location: [facility];  Service: Plastics;  Laterality: Left;  total 2.5 hours   CATARACT EXTRACTION W/PHACO Right 02/07/2022   Procedure: CATARACT EXTRACTION PHACO AND INTRAOCULAR LENS PLACEMENT (IOC) RIGHT DIABETIC;  Surgeon: Nevada Crane, MD;  Location: [facility];  Service: Ophthalmology;  Laterality: Right;  3.60 00:34.7   CATARACT EXTRACTION W/PHACO Left 02/28/2022   Procedure: CATARACT EXTRACTION PHACO AND INTRAOCULAR LENS PLACEMENT (IOC) LEFT DIABETIC 4.11 00:26.1;  Surgeon: Nevada Crane, MD;  Location: [facility];  Service: Ophthalmology;  Laterality: Left;   COLONOSCOPY  2015    COLONOSCOPY N/A 01/05/2023   Procedure: COLONOSCOPY;  Surgeon: Toney Reil, MD;  Location: [facility];  Service: Gastroenterology;  Laterality: N/A;   COLONOSCOPY WITH PROPOFOL N/A 10/29/2019   Procedure: COLONOSCOPY WITH PROPOFOL;  Surgeon: Midge Minium, MD;  Location: [facility];  Service: Endoscopy;  Laterality: N/A;   ESOPHAGOGASTRODUODENOSCOPY (EGD) WITH PROPOFOL N/A 05/24/2019   Procedure: ESOPHAGOGASTRODUODENOSCOPY (EGD) WITH PROPOFOL;  Surgeon: Midge Minium, MD;  Location: [facility];  Service: Endoscopy;  Laterality: N/A;   ESOPHAGOGASTRODUODENOSCOPY (EGD) WITH PROPOFOL N/A 09/06/2022   Procedure: ESOPHAGOGASTRODUODENOSCOPY (EGD) WITH PROPOFOL;  Surgeon: Midge Minium, MD;  Location: [facility];  Service: Endoscopy;  Laterality: N/A;   ESOPHAGOGASTRODUODENOSCOPY (EGD) WITH PROPOFOL  01/05/2023   Procedure: ESOPHAGOGASTRODUODENOSCOPY (EGD) WITH PROPOFOL;  Surgeon: Toney Reil, MD;  Location: [facility];  Service: Gastroenterology;;   ESOPHAGUS SURGERY     IR IMAGING GUIDED PORT INSERTION  02/07/2023   KNEE SURGERY Right    LIPOSUCTION WITH LIPOFILLING Right 06/10/2019   Procedure: right breast scar and capsule release with fat grafting;  Surgeon: Peggye Form, DO;  Location: [facility];  Service: Plastics;  Laterality: Right;   POLYPECTOMY  01/05/2023   Procedure: POLYPECTOMY;  Surgeon: Toney Reil, MD;  Location: [facility];  Service: Gastroenterology;;   [facility] REMOVAL     PORTACATH PLACEMENT Right 01/17/2018   Procedure: INSERTION PORT-A-CATH- RIGHT;  Surgeon: Earline Mayotte, MD;  Location: [facility];  Service: General;  Laterality: Right;   ROBOTIC ADRENALECTOMY Right 12/28/2016   Procedure: ROBOTIC ADRENALECTOMY;  Surgeon: Vanna Scotland, MD;  Location: [facility];  Service: Urology;  Laterality: Right;   SKIN CANCER EXCISION     THROAT SURGERY  1998   throat cancer    TUBAL LIGATION     VENTRAL HERNIA REPAIR  N/A 03/03/2017   Procedure: HERNIA REPAIR VENTRAL ADULT;  Surgeon: Ricarda Frame, MD;  Location: [facility];  Service: General;  Laterality: N/A;   VIDEO BRONCHOSCOPY WITH ENDOBRONCHIAL ULTRASOUND N/A 01/25/2023   Procedure: VIDEO BRONCHOSCOPY WITH ENDOBRONCHIAL ULTRASOUND;  Surgeon: Vida Rigger, MD;  Location: [facility];  Service: Thoracic;  Laterality: N/A;   VIDEO BRONCHOSCOPY WITH ENDOBRONCHIAL ULTRASOUND N/A 02/17/2023   Procedure: VIDEO BRONCHOSCOPY WITH ENDOBRONCHIAL ULTRASOUND;  Surgeon: Vida Rigger, MD;  Location: [facility];  Service: Thoracic;  Laterality: N/A;    Social History   Socioeconomic History   Marital status: Married    Spouse name: Brittina, Zuchowski (Spouse) [phone] (Mobile)   Number of children: 4   Years of education: Not on file   Highest education level: 10th grade  Occupational History   Occupation: retired  Tobacco Use   Smoking status: Former    Current packs/day: 0.00  Average packs/day: 1.5 packs/day for 30.0 years (45.0 ttl pk-yrs)    Types: Cigarettes    Start date: 05/02/1965    Quit date: 05/03/1995    Years since quitting: 27.8    Passive exposure: Past   Smokeless tobacco: Never  Vaping Use   Vaping status: Never Used  Substance and Sexual Activity   Alcohol use: Not Currently    Alcohol/week: 4.0 standard drinks of alcohol    Types: 4 Shots of liquor per week    Comment: none   Drug use: No   Sexual activity: Not Currently    Birth control/protection: Post-menopausal  Other Topics Concern   Not on file  Social History Narrative   Not on file   Social Determinants of Health   Financial Resource Strain: Low Risk  (12/02/2021)   Overall Financial Resource Strain (CARDIA)    Difficulty of Paying Living Expenses: Not very hard  Food Insecurity: No Food Insecurity (12/05/2022)   Hunger Vital Sign    Worried About Running Out of Food in the Last Year: Never true    Ran Out of Food in the Last Year: Never true  Transportation Needs: No  Transportation Needs (12/05/2022)   PRAPARE - Administrator, Civil Service (Medical): No    Lack of Transportation (Non-Medical): No  Physical Activity: Insufficiently Active (12/05/2022)   Exercise Vital Sign    Days of Exercise per Week: 3 days    Minutes of Exercise per Session: 30 min  Stress: Stress Concern Present (12/05/2022)   [employer] of Occupational Health - Occupational Stress Questionnaire    Feeling of Stress : Rather much  Social Connections: Moderately Isolated (12/05/2022)   Social Connection and Isolation Panel [NHANES]    Frequency of Communication with Friends and Family: Three times a week    Frequency of Social Gatherings with Friends and Family: Twice a week    Attends Religious Services: Never    Database administrator or Organizations: No    Attends Banker Meetings: Never    Marital Status: Married  Catering manager Violence: Not At Risk (12/05/2022)   Humiliation, Afraid, Rape, and Kick questionnaire    Fear of Current or Ex-Partner: No    Emotionally Abused: No    Physically Abused: No    Sexually Abused: No    Family History  Problem Relation Age of Onset   Diabetes Mother    Heart attack Mother    Cancer Sister        cancer of eyes, spread to lung, other places. died at 25   Cancer Sister 82       unknown primary, spread to bone, brain died in 46's   Cancer Paternal Aunt        unk type   Cancer Paternal Grandmother        type unk   Prostate cancer Neg Hx    Kidney cancer Neg Hx    Bladder Cancer Neg Hx    Breast cancer Neg Hx      Current Outpatient Medications:    aspirin EC 81 MG tablet, Take 1 tablet (81 mg total) by mouth daily. Swallow whole., Disp: 90 tablet, Rfl: 3   benzonatate (TESSALON) 100 MG capsule, Take 2 capsules (200 mg total) by mouth 2 (two) times daily as needed for cough., Disp: 180 capsule, Rfl: 0   buPROPion (WELLBUTRIN XL) 150 MG 24 hr tablet, Take 3 tablets (450 mg total) by mouth daily.,  Disp:  90 tablet, Rfl: 1   busPIRone (BUSPAR) 5 MG tablet, Take 1 tablet (5 mg total) by mouth 3 (three) times daily., Disp: 270 tablet, Rfl: 0   ciprofloxacin (CIPRO) 250 MG tablet, Take 1 tablet (250 mg total) by mouth 2 (two) times daily., Disp: 14 tablet, Rfl: 0   cyclobenzaprine (FLEXERIL) 5 MG tablet, TAKE 1 TABLET BY MOUTH AT BEDTIME, Disp: 30 tablet, Rfl: 0   dexamethasone (DECADRON) 4 MG tablet, Take 2 tablets daily for 2 days, start the day after chemotherapy. Take with food., Disp: 30 tablet, Rfl: 1   DULoxetine (CYMBALTA) 60 MG capsule, Take 1 capsule (60 mg total) by mouth 2 (two) times daily., Disp: 180 capsule, Rfl: 0   lidocaine (LIDODERM) 5 %, Place 1 patch onto the skin daily. Remove & Discard patch within 12 hours or as directed by MD, Disp: 30 patch, Rfl: 0   lidocaine-prilocaine (EMLA) cream, Apply to affected area once, Disp: 30 g, Rfl: 3   meloxicam (MOBIC) 7.5 MG tablet, TAKE 1 TABLET BY MOUTH DAILY, Disp: 30 tablet, Rfl: 0   montelukast (SINGULAIR) 10 MG tablet, Take 1 tablet (10 mg total) by mouth at bedtime., Disp: 90 tablet, Rfl: 3   omeprazole (PRILOSEC) 20 MG capsule, Take 1 capsule (20 mg total) by mouth 2 (two) times daily before a meal., Disp: 60 capsule, Rfl: 11   ondansetron (ZOFRAN) 8 MG tablet, Take 1 tablet (8 mg total) by mouth every 8 (eight) hours as needed for nausea or vomiting. Start on the third day after chemotherapy., Disp: 30 tablet, Rfl: 1   oxyCODONE (ROXICODONE) 5 MG immediate release tablet, Take 1 tablet (5 mg total) by mouth every 8 (eight) hours as needed for severe pain or moderate pain., Disp: 60 tablet, Rfl: 0   predniSONE (DELTASONE) 50 MG tablet, Take 1 tablet (50 mg total) by mouth daily with breakfast., Disp: 3 tablet, Rfl: 0   prochlorperazine (COMPAZINE) 10 MG tablet, Take 1 tablet (10 mg total) by mouth every 6 (six) hours as needed for nausea or vomiting., Disp: 30 tablet, Rfl: 1   rOPINIRole (REQUIP) 0.25 MG tablet, TAKE 1 TABLET BY  MOUTH AT BEDTIME, Disp: 90 tablet, Rfl: 1   rosuvastatin (CRESTOR) 40 MG tablet, TAKE 1 TABLET BY MOUTH DAILY, Disp: 90 tablet, Rfl: 1   tolterodine (DETROL LA) 4 MG 24 hr capsule, Take 1 capsule (4 mg total) by mouth daily., Disp: 30 capsule, Rfl: 11 No current facility-administered medications for this visit.  Facility-Administered Medications Ordered in Other Visits:    0.9 %  sodium chloride infusion, , Intravenous, Continuous, Creig Hines, MD, Last Rate: 10 mL/hr at 03/20/23 0925, New Bag at 03/20/23 0925   CARBOplatin (PARAPLATIN) 150 mg in sodium chloride 0.9 % 100 mL chemo infusion, 150 mg, Intravenous, Once, Creig Hines, MD   cyanocobalamin ((VITAMIN B-12)) injection 1,000 mcg, 1,000 mcg, Intramuscular, Q30 days, Creig Hines, MD, 1,000 mcg at 08/27/21 1537   cyanocobalamin (VITAMIN B12) injection 1,000 mcg, 1,000 mcg, Intramuscular, Q30 days, Creig Hines, MD, 1,000 mcg at 03/16/22 1101  Physical exam:  Vitals:   03/20/23 0831  BP: 127/73  Pulse: 88  Temp: 98.7 F (37.1 C)  TempSrc: Tympanic  SpO2: 98%  Weight: 126 lb (57.2 kg)   Physical Exam Constitutional:      Comments: Somewhat frail-appearing  Cardiovascular:     Rate and Rhythm: Normal rate and regular rhythm.     Heart sounds: Normal heart sounds.  Pulmonary:  Effort: Pulmonary effort is normal.     Breath sounds: Normal breath sounds.  Skin:    General: Skin is warm and dry.  Neurological:     Mental Status: She is alert and oriented to person, place, and time.         Latest Ref Rng & Units 03/20/2023    8:21 AM  CMP  Glucose 70 - 99 mg/dL 99   BUN 8 - 23 mg/dL 19   Creatinine 1.61 - 1.00 mg/dL 0.96   Sodium 045 - 409 mmol/L 140   Potassium 3.5 - 5.1 mmol/L 3.8   Chloride 98 - 111 mmol/L 106   CO2 22 - 32 mmol/L 25   Calcium 8.9 - 10.3 mg/dL 8.8   Total Protein 6.5 - 8.1 g/dL 6.3   Total Bilirubin <8.1 mg/dL 0.6   Alkaline Phos 38 - 126 U/L 73   AST 15 - 41 U/L 32   ALT 0 - 44 U/L  31       Latest Ref Rng & Units 03/20/2023    8:21 AM  CBC  WBC 4.0 - 10.5 K/uL 6.5   Hemoglobin 12.0 - 15.0 g/dL 19.1   Hematocrit 47.8 - 46.0 % 43.0   Platelets 150 - 400 K/uL 186     No images are attached to the encounter.  MM 3D SCREENING MAMMOGRAM BILATERAL BREAST  Result Date: 03/13/2023 CLINICAL DATA:  Screening. EXAM: DIGITAL SCREENING BILATERAL MAMMOGRAM WITH TOMOSYNTHESIS AND CAD TECHNIQUE: Bilateral screening digital craniocaudal and mediolateral oblique mammograms were obtained. Bilateral screening digital breast tomosynthesis was performed. The images were evaluated with computer-aided detection. COMPARISON:  Previous exam(s). ACR Breast Density Category b: There are scattered areas of fibroglandular density. FINDINGS: There are no findings suspicious for malignancy. IMPRESSION: No mammographic evidence of malignancy. A result letter of this screening mammogram will be mailed directly to the patient. RECOMMENDATION: Screening mammogram in one year. (Code:SM-B-01Y) BI-RADS CATEGORY  1: Negative. Electronically Signed   By: Amie Portland M.D.   On: 03/13/2023 13:49     Assessment and plan- Patient is a 67 y.o. female with history of head and neck, breast cancer as well as adrenal carcinoma of unknown primary in the past now with non-small cell carcinoma of unknown primary predominantly with mediastinal adenopathy.  She is here for on treatment assessment prior to cycle 1 of weekly CarboTaxol chemotherapy  Patient found to have bilateral lung nodules which are not significantly hypermetabolic and could be inflammatory as well.  However she does have a hypermetabolic mediastinal and hilar adenopathy which was biopsy-proven non-small cell carcinoma.  However specimen size was not adequate to determine primary site.  It is therefore unclear if we are dealing with metastatic breast cancer versus non-small cell lung cancer.  I am treating her like a stage III non-small cell lung cancer  and we will proceed with concurrent chemoradiation at this time.  Counts okay to proceed with cycle 1 of weekly CarboTaxol today.  She will directly proceed for cycle 2 next week and I will see her back in 2 weeks for cycle 3   Visit Diagnosis 1. Non-small cell cancer of left lung (HCC)   2. Encounter for antineoplastic chemotherapy      Dr. Owens Shark, MD, MPH [facility] [phone] 03/20/2023 12:11 PM

## 2023-03-21 ENCOUNTER — Ambulatory Visit
Admission: RE | Admit: 2023-03-21 | Discharge: 2023-03-21 | Disposition: A | Discharge status: Home / Self Care | Payer: Medicare HMO | Source: Ambulatory Visit | Attending: Radiation Oncology | Admitting: Radiation Oncology

## 2023-03-21 ENCOUNTER — Encounter: Payer: Self-pay | Admitting: *Deleted

## 2023-03-22 ENCOUNTER — Other Ambulatory Visit: Payer: Self-pay

## 2023-03-22 ENCOUNTER — Ambulatory Visit
Admission: RE | Admit: 2023-03-22 | Discharge: 2023-03-22 | Disposition: A | Discharge status: Home / Self Care | Payer: Medicare HMO | Source: Ambulatory Visit | Attending: Radiation Oncology | Admitting: Radiation Oncology

## 2023-03-22 DIAGNOSIS — Z87891 Personal history of nicotine dependence: Secondary | ICD-10-CM | POA: Diagnosis not present

## 2023-03-22 DIAGNOSIS — Z8521 Personal history of malignant neoplasm of larynx: Secondary | ICD-10-CM | POA: Diagnosis not present

## 2023-03-22 DIAGNOSIS — Z510 Encounter for antineoplastic radiation therapy: Secondary | ICD-10-CM | POA: Diagnosis not present

## 2023-03-22 DIAGNOSIS — Z923 Personal history of irradiation: Secondary | ICD-10-CM | POA: Diagnosis not present

## 2023-03-22 DIAGNOSIS — Z5111 Encounter for antineoplastic chemotherapy: Secondary | ICD-10-CM | POA: Diagnosis not present

## 2023-03-22 DIAGNOSIS — Z853 Personal history of malignant neoplasm of breast: Secondary | ICD-10-CM | POA: Diagnosis not present

## 2023-03-22 DIAGNOSIS — C3412 Malignant neoplasm of upper lobe, left bronchus or lung: Secondary | ICD-10-CM | POA: Diagnosis not present

## 2023-03-22 LAB — RAD ONC ARIA SESSION SUMMARY
Course Elapsed Days: 0
Plan Fractions Treated to Date: 1
Plan Prescribed Dose Per Fraction: 2 Gy
Plan Total Fractions Prescribed: 33
Plan Total Prescribed Dose: 66 Gy
Reference Point Dosage Given to Date: 2 Gy
Reference Point Session Dosage Given: 2 Gy
Session Number: 1

## 2023-03-23 ENCOUNTER — Other Ambulatory Visit: Payer: Self-pay

## 2023-03-23 ENCOUNTER — Ambulatory Visit
Admission: RE | Admit: 2023-03-23 | Discharge: 2023-03-23 | Disposition: A | Discharge status: Home / Self Care | Payer: Medicare HMO | Source: Ambulatory Visit | Attending: Radiation Oncology | Admitting: Radiation Oncology

## 2023-03-23 DIAGNOSIS — Z510 Encounter for antineoplastic radiation therapy: Secondary | ICD-10-CM | POA: Diagnosis not present

## 2023-03-23 DIAGNOSIS — Z923 Personal history of irradiation: Secondary | ICD-10-CM | POA: Diagnosis not present

## 2023-03-23 DIAGNOSIS — Z5111 Encounter for antineoplastic chemotherapy: Secondary | ICD-10-CM | POA: Diagnosis not present

## 2023-03-23 DIAGNOSIS — Z8521 Personal history of malignant neoplasm of larynx: Secondary | ICD-10-CM | POA: Diagnosis not present

## 2023-03-23 DIAGNOSIS — Z87891 Personal history of nicotine dependence: Secondary | ICD-10-CM | POA: Diagnosis not present

## 2023-03-23 DIAGNOSIS — C3412 Malignant neoplasm of upper lobe, left bronchus or lung: Secondary | ICD-10-CM | POA: Diagnosis not present

## 2023-03-23 DIAGNOSIS — Z853 Personal history of malignant neoplasm of breast: Secondary | ICD-10-CM | POA: Diagnosis not present

## 2023-03-23 LAB — RAD ONC ARIA SESSION SUMMARY
Course Elapsed Days: 1
Plan Fractions Treated to Date: 2
Plan Prescribed Dose Per Fraction: 2 Gy
Plan Total Fractions Prescribed: 33
Plan Total Prescribed Dose: 66 Gy
Reference Point Dosage Given to Date: 4 Gy
Reference Point Session Dosage Given: 2 Gy
Session Number: 2

## 2023-03-24 ENCOUNTER — Other Ambulatory Visit: Payer: Self-pay

## 2023-03-24 ENCOUNTER — Ambulatory Visit
Admission: RE | Admit: 2023-03-24 | Discharge: 2023-03-24 | Disposition: A | Discharge status: Home / Self Care | Payer: Medicare HMO | Source: Ambulatory Visit | Attending: Radiation Oncology | Admitting: Radiation Oncology

## 2023-03-24 DIAGNOSIS — C3412 Malignant neoplasm of upper lobe, left bronchus or lung: Secondary | ICD-10-CM | POA: Diagnosis not present

## 2023-03-24 DIAGNOSIS — Z8521 Personal history of malignant neoplasm of larynx: Secondary | ICD-10-CM | POA: Diagnosis not present

## 2023-03-24 DIAGNOSIS — Z87891 Personal history of nicotine dependence: Secondary | ICD-10-CM | POA: Diagnosis not present

## 2023-03-24 DIAGNOSIS — Z510 Encounter for antineoplastic radiation therapy: Secondary | ICD-10-CM | POA: Diagnosis not present

## 2023-03-24 DIAGNOSIS — Z923 Personal history of irradiation: Secondary | ICD-10-CM | POA: Diagnosis not present

## 2023-03-24 DIAGNOSIS — Z5111 Encounter for antineoplastic chemotherapy: Secondary | ICD-10-CM | POA: Diagnosis not present

## 2023-03-24 DIAGNOSIS — Z853 Personal history of malignant neoplasm of breast: Secondary | ICD-10-CM | POA: Diagnosis not present

## 2023-03-24 LAB — RAD ONC ARIA SESSION SUMMARY
Course Elapsed Days: 2
Plan Fractions Treated to Date: 3
Plan Prescribed Dose Per Fraction: 2 Gy
Plan Total Fractions Prescribed: 33
Plan Total Prescribed Dose: 66 Gy
Reference Point Dosage Given to Date: 6 Gy
Reference Point Session Dosage Given: 2 Gy
Session Number: 3

## 2023-03-27 ENCOUNTER — Other Ambulatory Visit: Payer: Medicare HMO

## 2023-03-27 ENCOUNTER — Ambulatory Visit: Payer: Medicare HMO | Admitting: Oncology

## 2023-03-27 ENCOUNTER — Ambulatory Visit
Admission: RE | Admit: 2023-03-27 | Discharge: 2023-03-27 | Disposition: A | Discharge status: Home / Self Care | Payer: Medicare HMO | Source: Ambulatory Visit | Attending: Radiation Oncology | Admitting: Radiation Oncology

## 2023-03-27 ENCOUNTER — Inpatient Hospital Stay: Payer: Medicare HMO

## 2023-03-27 ENCOUNTER — Other Ambulatory Visit: Payer: Self-pay | Admitting: Oncology

## 2023-03-27 ENCOUNTER — Encounter: Payer: Self-pay | Admitting: Oncology

## 2023-03-27 ENCOUNTER — Other Ambulatory Visit: Payer: Self-pay

## 2023-03-27 VITALS — BP 124/59 | HR 97 | Temp 99.2°F | Resp 16 | Wt 122.2 lb

## 2023-03-27 DIAGNOSIS — Z5111 Encounter for antineoplastic chemotherapy: Secondary | ICD-10-CM | POA: Diagnosis not present

## 2023-03-27 DIAGNOSIS — N182 Chronic kidney disease, stage 2 (mild): Secondary | ICD-10-CM | POA: Insufficient documentation

## 2023-03-27 DIAGNOSIS — C3412 Malignant neoplasm of upper lobe, left bronchus or lung: Secondary | ICD-10-CM | POA: Diagnosis not present

## 2023-03-27 DIAGNOSIS — M545 Low back pain, unspecified: Secondary | ICD-10-CM | POA: Insufficient documentation

## 2023-03-27 DIAGNOSIS — M62830 Muscle spasm of back: Secondary | ICD-10-CM | POA: Insufficient documentation

## 2023-03-27 DIAGNOSIS — Z853 Personal history of malignant neoplasm of breast: Secondary | ICD-10-CM | POA: Diagnosis not present

## 2023-03-27 DIAGNOSIS — Z923 Personal history of irradiation: Secondary | ICD-10-CM | POA: Diagnosis not present

## 2023-03-27 DIAGNOSIS — Z87891 Personal history of nicotine dependence: Secondary | ICD-10-CM | POA: Diagnosis not present

## 2023-03-27 DIAGNOSIS — Z510 Encounter for antineoplastic radiation therapy: Secondary | ICD-10-CM | POA: Diagnosis not present

## 2023-03-27 DIAGNOSIS — Z8521 Personal history of malignant neoplasm of larynx: Secondary | ICD-10-CM | POA: Diagnosis not present

## 2023-03-27 DIAGNOSIS — C3492 Malignant neoplasm of unspecified part of left bronchus or lung: Secondary | ICD-10-CM

## 2023-03-27 LAB — CBC WITH DIFFERENTIAL (CANCER CENTER ONLY)
Abs Immature Granulocytes: 0.03 10*3/uL (ref 0.00–0.07)
Basophils Absolute: 0.1 10*3/uL (ref 0.0–0.1)
Basophils Relative: 1 %
Eosinophils Absolute: 0.4 10*3/uL (ref 0.0–0.5)
Eosinophils Relative: 5 %
HCT: 43.7 % (ref 36.0–46.0)
Hemoglobin: 14.4 g/dL (ref 12.0–15.0)
Immature Granulocytes: 0 %
Lymphocytes Relative: 20 %
Lymphs Abs: 1.3 10*3/uL (ref 0.7–4.0)
MCH: 32 pg (ref 26.0–34.0)
MCHC: 33 g/dL (ref 30.0–36.0)
MCV: 97.1 fL (ref 80.0–100.0)
Monocytes Absolute: 0.3 10*3/uL (ref 0.1–1.0)
Monocytes Relative: 4 %
Neutro Abs: 4.7 10*3/uL (ref 1.7–7.7)
Neutrophils Relative %: 70 %
Platelet Count: 189 10*3/uL (ref 150–400)
RBC: 4.5 MIL/uL (ref 3.87–5.11)
RDW: 11.7 % (ref 11.5–15.5)
WBC Count: 6.8 10*3/uL (ref 4.0–10.5)
nRBC: 0 % (ref 0.0–0.2)

## 2023-03-27 LAB — RAD ONC ARIA SESSION SUMMARY
Course Elapsed Days: 5
Plan Fractions Treated to Date: 4
Plan Prescribed Dose Per Fraction: 2 Gy
Plan Total Fractions Prescribed: 33
Plan Total Prescribed Dose: 66 Gy
Reference Point Dosage Given to Date: 8 Gy
Reference Point Session Dosage Given: 2 Gy
Session Number: 4

## 2023-03-27 LAB — CMP (CANCER CENTER ONLY)
ALT: 26 U/L (ref 0–44)
AST: 29 U/L (ref 15–41)
Albumin: 3.9 g/dL (ref 3.5–5.0)
Alkaline Phosphatase: 76 U/L (ref 38–126)
Anion gap: 12 (ref 5–15)
BUN: 26 mg/dL — ABNORMAL HIGH (ref 8–23)
CO2: 26 mmol/L (ref 22–32)
Calcium: 8.8 mg/dL — ABNORMAL LOW (ref 8.9–10.3)
Chloride: 99 mmol/L (ref 98–111)
Creatinine: 0.7 mg/dL (ref 0.44–1.00)
GFR, Estimated: >60 mL/min (ref >60)
Glucose, Bld: 106 mg/dL — ABNORMAL HIGH (ref 70–99)
Potassium: 3.7 mmol/L (ref 3.5–5.1)
Sodium: 137 mmol/L (ref 135–145)
Total Bilirubin: 1 mg/dL (ref <1.2)
Total Protein: 6.5 g/dL (ref 6.5–8.1)

## 2023-03-27 LAB — IRON AND TIBC
Iron: 53 ug/dL (ref 28–170)
Saturation Ratios: 21 % (ref 10.4–31.8)
TIBC: 252 ug/dL (ref 250–450)
UIBC: 199 ug/dL

## 2023-03-27 LAB — FERRITIN: Ferritin: 362 ng/mL — ABNORMAL HIGH (ref 11–307)

## 2023-03-27 MED ORDER — SODIUM CHLORIDE 0.9 % IV SOLN
140.0000 mg | Freq: Once | INTRAVENOUS | Status: AC
  Administered 2023-03-27: 140 mg via INTRAVENOUS
  Filled 2023-03-27: qty 14

## 2023-03-27 MED ORDER — SODIUM CHLORIDE 0.9 % IV SOLN
INTRAVENOUS | Status: DC
  Filled 2023-03-27: qty 250

## 2023-03-27 MED ORDER — DIPHENHYDRAMINE HCL 50 MG/ML IJ SOLN
50.0000 mg | Freq: Once | INTRAMUSCULAR | Status: AC
  Administered 2023-03-27: 50 mg via INTRAVENOUS
  Filled 2023-03-27: qty 1

## 2023-03-27 MED ORDER — HEPARIN SOD (PORK) LOCK FLUSH 100 UNIT/ML IV SOLN
500.0000 [IU] | Freq: Once | INTRAVENOUS | Status: AC | PRN
  Administered 2023-03-27: 500 [IU]
  Filled 2023-03-27: qty 5

## 2023-03-27 MED ORDER — FAMOTIDINE IN NACL 20-0.9 MG/50ML-% IV SOLN
20.0000 mg | Freq: Once | INTRAVENOUS | Status: AC
  Administered 2023-03-27: 20 mg via INTRAVENOUS
  Filled 2023-03-27: qty 50

## 2023-03-27 MED ORDER — PALONOSETRON HCL INJECTION 0.25 MG/5ML
0.2500 mg | Freq: Once | INTRAVENOUS | Status: AC
  Administered 2023-03-27: 0.25 mg via INTRAVENOUS
  Filled 2023-03-27: qty 5

## 2023-03-27 MED ORDER — DEXAMETHASONE SODIUM PHOSPHATE 10 MG/ML IJ SOLN
10.0000 mg | Freq: Once | INTRAMUSCULAR | Status: AC
  Administered 2023-03-27: 10 mg via INTRAVENOUS
  Filled 2023-03-27: qty 1

## 2023-03-27 MED ORDER — SODIUM CHLORIDE 0.9 % IV SOLN
50.0000 mg/m2 | Freq: Once | INTRAVENOUS | Status: AC
  Administered 2023-03-27: 78 mg via INTRAVENOUS
  Filled 2023-03-27: qty 13

## 2023-03-27 MED ORDER — SODIUM CHLORIDE 0.9 % IV SOLN
183.4000 mg | Freq: Once | INTRAVENOUS | Status: DC
Start: 2023-03-27 — End: 2023-03-27

## 2023-03-27 NOTE — Patient Instructions (Signed)
[facility] - A DEPT OF [facility]  Discharge Instructions: Thank you for choosing [facility] to provide your oncology and hematology care.  If you have a lab appointment with the Cancer Center, please go directly to the Cancer Center and check in at the registration area.  Wear comfortable clothing and clothing appropriate for easy access to any Portacath or PICC line.   We strive to give you quality time with your provider. You may need to reschedule your appointment if you arrive late (15 or more minutes).  Arriving late affects you and other patients whose appointments are after yours.  Also, if you miss three or more appointments without notifying the office, you may be dismissed from the clinic at the provider's discretion.      For prescription refill requests, have your pharmacy contact our office and allow 72 hours for refills to be completed.    Today you received the following chemotherapy and/or immunotherapy agents taxol and carboplatin      To help prevent nausea and vomiting after your treatment, we encourage you to take your nausea medication as directed.  BELOW ARE SYMPTOMS THAT SHOULD BE REPORTED IMMEDIATELY: *FEVER GREATER THAN 100.4 F (38 C) OR HIGHER *CHILLS OR SWEATING *NAUSEA AND VOMITING THAT IS NOT CONTROLLED WITH YOUR NAUSEA MEDICATION *UNUSUAL SHORTNESS OF BREATH *UNUSUAL BRUISING OR BLEEDING *URINARY PROBLEMS (pain or burning when urinating, or frequent urination) *BOWEL PROBLEMS (unusual diarrhea, constipation, pain near the anus) TENDERNESS IN MOUTH AND THROAT WITH OR WITHOUT PRESENCE OF ULCERS (sore throat, sores in mouth, or a toothache) UNUSUAL RASH, SWELLING OR PAIN  UNUSUAL VAGINAL DISCHARGE OR ITCHING   Items with * indicate a potential emergency and should be followed up as soon as possible or go to the Emergency Department if any problems should occur.  Please show the CHEMOTHERAPY ALERT CARD or  IMMUNOTHERAPY ALERT CARD at check-in to the Emergency Department and triage nurse.  Should you have questions after your visit or need to cancel or reschedule your appointment, please contact [facility] - A DEPT OF Eligha Bridegroom [facility]  [phone] and follow the prompts.  Office hours are 8:00 a.m. to 4:30 p.m. Monday - Friday. Please note that voicemails left after 4:00 p.m. may not be returned until the following business day.  We are closed weekends and major holidays. You have access to a nurse at all times for urgent questions. Please call the main number to the clinic [phone] and follow the prompts.  For any non-urgent questions, you may also contact your provider using MyChart. We now offer e-Visits for anyone 9 and older to request care online for non-urgent symptoms. For details visit [email or web address].   Also download the MyChart app! Go to the app store, search "MyChart", open the app, select , and log in with your MyChart username and password.

## 2023-03-27 NOTE — Assessment & Plan Note (Addendum)
Noted. No acute concerns.  Continue to monitor.

## 2023-03-27 NOTE — Assessment & Plan Note (Signed)
Acute onset of back pain radiating from lower to upper back over the past few days. No acute injury reported. Pain exacerbated by movement and palpation. Patient has been using oxycodone and Flexeril with minimal relief.  -Order back X-ray to rule out any underlying pathology. -Prescribe short course of Prednisone (3 tablets) to address inflammation. -Prescribe Lidocaine patches for topical pain relief. -Provide patient with gentle thoracic stretches to help alleviate pain.

## 2023-03-27 NOTE — Progress Notes (Signed)
Ok to adjust Carboplatin dose per Dr. Smith Robert.  Today's CrCl = 46 mL/min   Ebony Hail, Pharm.D., CPP 03/27/2023@12 :25 PM

## 2023-03-28 ENCOUNTER — Ambulatory Visit
Admission: RE | Admit: 2023-03-28 | Discharge: 2023-03-28 | Disposition: A | Discharge status: Home / Self Care | Payer: Medicare HMO | Source: Ambulatory Visit | Attending: Radiation Oncology | Admitting: Radiation Oncology

## 2023-03-28 ENCOUNTER — Other Ambulatory Visit: Payer: Self-pay | Admitting: Oncology

## 2023-03-28 ENCOUNTER — Other Ambulatory Visit: Payer: Self-pay | Admitting: *Deleted

## 2023-03-28 ENCOUNTER — Other Ambulatory Visit: Payer: Self-pay

## 2023-03-28 DIAGNOSIS — Z8521 Personal history of malignant neoplasm of larynx: Secondary | ICD-10-CM | POA: Diagnosis not present

## 2023-03-28 DIAGNOSIS — Z923 Personal history of irradiation: Secondary | ICD-10-CM | POA: Diagnosis not present

## 2023-03-28 DIAGNOSIS — Z87891 Personal history of nicotine dependence: Secondary | ICD-10-CM | POA: Diagnosis not present

## 2023-03-28 DIAGNOSIS — C3412 Malignant neoplasm of upper lobe, left bronchus or lung: Secondary | ICD-10-CM | POA: Diagnosis not present

## 2023-03-28 DIAGNOSIS — Z5111 Encounter for antineoplastic chemotherapy: Secondary | ICD-10-CM | POA: Diagnosis not present

## 2023-03-28 DIAGNOSIS — Z853 Personal history of malignant neoplasm of breast: Secondary | ICD-10-CM | POA: Diagnosis not present

## 2023-03-28 DIAGNOSIS — Z510 Encounter for antineoplastic radiation therapy: Secondary | ICD-10-CM | POA: Diagnosis not present

## 2023-03-28 LAB — RAD ONC ARIA SESSION SUMMARY
Course Elapsed Days: 6
Plan Fractions Treated to Date: 5
Plan Prescribed Dose Per Fraction: 2 Gy
Plan Total Fractions Prescribed: 33
Plan Total Prescribed Dose: 66 Gy
Reference Point Dosage Given to Date: 10 Gy
Reference Point Session Dosage Given: 2 Gy
Session Number: 5

## 2023-03-28 MED ORDER — SUCRALFATE 1 G PO TABS: 1.0000 g | ORAL_TABLET | Freq: Three times a day (TID) | ORAL | 4 refills | Status: DC

## 2023-03-29 ENCOUNTER — Other Ambulatory Visit: Payer: Self-pay

## 2023-03-29 ENCOUNTER — Ambulatory Visit
Admission: RE | Admit: 2023-03-29 | Discharge: 2023-03-29 | Disposition: A | Discharge status: Home / Self Care | Payer: Medicare HMO | Source: Ambulatory Visit | Attending: Radiation Oncology | Admitting: Radiation Oncology

## 2023-03-29 ENCOUNTER — Encounter: Payer: Self-pay | Admitting: Oncology

## 2023-03-29 DIAGNOSIS — C3492 Malignant neoplasm of unspecified part of left bronchus or lung: Secondary | ICD-10-CM | POA: Diagnosis not present

## 2023-03-29 DIAGNOSIS — D701 Agranulocytosis secondary to cancer chemotherapy: Secondary | ICD-10-CM | POA: Diagnosis not present

## 2023-03-29 DIAGNOSIS — C50511 Malignant neoplasm of lower-outer quadrant of right female breast: Secondary | ICD-10-CM | POA: Diagnosis not present

## 2023-03-29 DIAGNOSIS — K208 Other esophagitis without bleeding: Secondary | ICD-10-CM | POA: Diagnosis not present

## 2023-03-29 DIAGNOSIS — Z8521 Personal history of malignant neoplasm of larynx: Secondary | ICD-10-CM | POA: Diagnosis not present

## 2023-03-29 DIAGNOSIS — R12 Heartburn: Secondary | ICD-10-CM | POA: Diagnosis not present

## 2023-03-29 DIAGNOSIS — C3412 Malignant neoplasm of upper lobe, left bronchus or lung: Secondary | ICD-10-CM | POA: Diagnosis not present

## 2023-03-29 DIAGNOSIS — B379 Candidiasis, unspecified: Secondary | ICD-10-CM | POA: Diagnosis not present

## 2023-03-29 DIAGNOSIS — Z510 Encounter for antineoplastic radiation therapy: Secondary | ICD-10-CM | POA: Diagnosis not present

## 2023-03-29 DIAGNOSIS — Z853 Personal history of malignant neoplasm of breast: Secondary | ICD-10-CM | POA: Diagnosis not present

## 2023-03-29 DIAGNOSIS — Z87891 Personal history of nicotine dependence: Secondary | ICD-10-CM | POA: Diagnosis not present

## 2023-03-29 DIAGNOSIS — L270 Generalized skin eruption due to drugs and medicaments taken internally: Secondary | ICD-10-CM | POA: Diagnosis not present

## 2023-03-29 DIAGNOSIS — T451X5A Adverse effect of antineoplastic and immunosuppressive drugs, initial encounter: Secondary | ICD-10-CM | POA: Diagnosis not present

## 2023-03-29 DIAGNOSIS — Z809 Family history of malignant neoplasm, unspecified: Secondary | ICD-10-CM | POA: Diagnosis not present

## 2023-03-29 DIAGNOSIS — Z5111 Encounter for antineoplastic chemotherapy: Secondary | ICD-10-CM | POA: Diagnosis not present

## 2023-03-29 DIAGNOSIS — Z923 Personal history of irradiation: Secondary | ICD-10-CM | POA: Diagnosis not present

## 2023-03-29 LAB — RAD ONC ARIA SESSION SUMMARY
Course Elapsed Days: 7
Plan Fractions Treated to Date: 6
Plan Prescribed Dose Per Fraction: 2 Gy
Plan Total Fractions Prescribed: 33
Plan Total Prescribed Dose: 66 Gy
Reference Point Dosage Given to Date: 12 Gy
Reference Point Session Dosage Given: 2 Gy
Session Number: 6

## 2023-04-03 ENCOUNTER — Ambulatory Visit
Admission: RE | Admit: 2023-04-03 | Discharge: 2023-04-03 | Disposition: A | Discharge status: Home / Self Care | Payer: Medicare HMO | Source: Ambulatory Visit | Attending: Radiation Oncology | Admitting: Radiation Oncology

## 2023-04-03 ENCOUNTER — Inpatient Hospital Stay: Payer: Medicare HMO

## 2023-04-03 ENCOUNTER — Encounter: Payer: Self-pay | Admitting: Oncology

## 2023-04-03 ENCOUNTER — Inpatient Hospital Stay (HOSPITAL_BASED_OUTPATIENT_CLINIC_OR_DEPARTMENT_OTHER): Payer: Medicare HMO | Admitting: Oncology

## 2023-04-03 ENCOUNTER — Other Ambulatory Visit: Payer: Self-pay

## 2023-04-03 VITALS — BP 114/80 | HR 95 | Temp 95.0°F | Resp 18 | Wt 119.8 lb

## 2023-04-03 VITALS — BP 132/81 | HR 94 | Temp 96.0°F | Resp 18

## 2023-04-03 DIAGNOSIS — Z87891 Personal history of nicotine dependence: Secondary | ICD-10-CM | POA: Insufficient documentation

## 2023-04-03 DIAGNOSIS — Z8521 Personal history of malignant neoplasm of larynx: Secondary | ICD-10-CM

## 2023-04-03 DIAGNOSIS — M545 Low back pain, unspecified: Secondary | ICD-10-CM | POA: Insufficient documentation

## 2023-04-03 DIAGNOSIS — Z809 Family history of malignant neoplasm, unspecified: Secondary | ICD-10-CM | POA: Insufficient documentation

## 2023-04-03 DIAGNOSIS — K208 Other esophagitis without bleeding: Secondary | ICD-10-CM | POA: Insufficient documentation

## 2023-04-03 DIAGNOSIS — M2550 Pain in unspecified joint: Secondary | ICD-10-CM | POA: Insufficient documentation

## 2023-04-03 DIAGNOSIS — Z923 Personal history of irradiation: Secondary | ICD-10-CM | POA: Insufficient documentation

## 2023-04-03 DIAGNOSIS — G893 Neoplasm related pain (acute) (chronic): Secondary | ICD-10-CM | POA: Insufficient documentation

## 2023-04-03 DIAGNOSIS — C3412 Malignant neoplasm of upper lobe, left bronchus or lung: Secondary | ICD-10-CM | POA: Insufficient documentation

## 2023-04-03 DIAGNOSIS — Z853 Personal history of malignant neoplasm of breast: Secondary | ICD-10-CM | POA: Insufficient documentation

## 2023-04-03 DIAGNOSIS — R590 Localized enlarged lymph nodes: Secondary | ICD-10-CM | POA: Insufficient documentation

## 2023-04-03 DIAGNOSIS — Z5111 Encounter for antineoplastic chemotherapy: Secondary | ICD-10-CM | POA: Insufficient documentation

## 2023-04-03 DIAGNOSIS — L270 Generalized skin eruption due to drugs and medicaments taken internally: Secondary | ICD-10-CM | POA: Insufficient documentation

## 2023-04-03 DIAGNOSIS — R12 Heartburn: Secondary | ICD-10-CM | POA: Insufficient documentation

## 2023-04-03 DIAGNOSIS — R918 Other nonspecific abnormal finding of lung field: Secondary | ICD-10-CM | POA: Insufficient documentation

## 2023-04-03 DIAGNOSIS — M791 Myalgia, unspecified site: Secondary | ICD-10-CM | POA: Insufficient documentation

## 2023-04-03 DIAGNOSIS — B379 Candidiasis, unspecified: Secondary | ICD-10-CM | POA: Insufficient documentation

## 2023-04-03 DIAGNOSIS — Z510 Encounter for antineoplastic radiation therapy: Secondary | ICD-10-CM | POA: Insufficient documentation

## 2023-04-03 DIAGNOSIS — C50511 Malignant neoplasm of lower-outer quadrant of right female breast: Secondary | ICD-10-CM | POA: Diagnosis not present

## 2023-04-03 DIAGNOSIS — T451X5A Adverse effect of antineoplastic and immunosuppressive drugs, initial encounter: Secondary | ICD-10-CM | POA: Insufficient documentation

## 2023-04-03 DIAGNOSIS — D701 Agranulocytosis secondary to cancer chemotherapy: Secondary | ICD-10-CM | POA: Insufficient documentation

## 2023-04-03 DIAGNOSIS — C3492 Malignant neoplasm of unspecified part of left bronchus or lung: Secondary | ICD-10-CM

## 2023-04-03 DIAGNOSIS — R058 Other specified cough: Secondary | ICD-10-CM | POA: Insufficient documentation

## 2023-04-03 DIAGNOSIS — R1310 Dysphagia, unspecified: Secondary | ICD-10-CM | POA: Insufficient documentation

## 2023-04-03 DIAGNOSIS — Z5189 Encounter for other specified aftercare: Secondary | ICD-10-CM | POA: Insufficient documentation

## 2023-04-03 DIAGNOSIS — Y842 Radiological procedure and radiotherapy as the cause of abnormal reaction of the patient, or of later complication, without mention of misadventure at the time of the procedure: Secondary | ICD-10-CM | POA: Insufficient documentation

## 2023-04-03 LAB — CMP (CANCER CENTER ONLY)
ALT: 24 U/L (ref 0–44)
AST: 25 U/L (ref 15–41)
Albumin: 3.6 g/dL (ref 3.5–5.0)
Alkaline Phosphatase: 64 U/L (ref 38–126)
Anion gap: 10 (ref 5–15)
BUN: 15 mg/dL (ref 8–23)
CO2: 27 mmol/L (ref 22–32)
Calcium: 8.5 mg/dL — ABNORMAL LOW (ref 8.9–10.3)
Chloride: 100 mmol/L (ref 98–111)
Creatinine: 0.81 mg/dL (ref 0.44–1.00)
GFR, Estimated: >60 mL/min (ref >60)
Glucose, Bld: 110 mg/dL — ABNORMAL HIGH (ref 70–99)
Potassium: 3.6 mmol/L (ref 3.5–5.1)
Sodium: 137 mmol/L (ref 135–145)
Total Bilirubin: 0.7 mg/dL (ref <1.2)
Total Protein: 6.1 g/dL — ABNORMAL LOW (ref 6.5–8.1)

## 2023-04-03 LAB — CBC WITH DIFFERENTIAL (CANCER CENTER ONLY)
Abs Immature Granulocytes: 0.02 10*3/uL (ref 0.00–0.07)
Basophils Absolute: 0 10*3/uL (ref 0.0–0.1)
Basophils Relative: 1 %
Eosinophils Absolute: 0.1 10*3/uL (ref 0.0–0.5)
Eosinophils Relative: 3 %
HCT: 40.1 % (ref 36.0–46.0)
Hemoglobin: 13.3 g/dL (ref 12.0–15.0)
Immature Granulocytes: 1 %
Lymphocytes Relative: 27 %
Lymphs Abs: 0.8 10*3/uL (ref 0.7–4.0)
MCH: 32.2 pg (ref 26.0–34.0)
MCHC: 33.2 g/dL (ref 30.0–36.0)
MCV: 97.1 fL (ref 80.0–100.0)
Monocytes Absolute: 0.3 10*3/uL (ref 0.1–1.0)
Monocytes Relative: 10 %
Neutro Abs: 1.7 10*3/uL (ref 1.7–7.7)
Neutrophils Relative %: 58 %
Platelet Count: 197 10*3/uL (ref 150–400)
RBC: 4.13 MIL/uL (ref 3.87–5.11)
RDW: 11.8 % (ref 11.5–15.5)
WBC Count: 3 10*3/uL — ABNORMAL LOW (ref 4.0–10.5)
nRBC: 0 % (ref 0.0–0.2)

## 2023-04-03 LAB — RAD ONC ARIA SESSION SUMMARY
Course Elapsed Days: 12
Plan Fractions Treated to Date: 7
Plan Prescribed Dose Per Fraction: 2 Gy
Plan Total Fractions Prescribed: 33
Plan Total Prescribed Dose: 66 Gy
Reference Point Dosage Given to Date: 14 Gy
Reference Point Session Dosage Given: 2 Gy
Session Number: 7

## 2023-04-03 MED ORDER — SODIUM CHLORIDE 0.9 % IV SOLN
INTRAVENOUS | Status: DC
Start: 2023-04-03 — End: 2023-04-03
  Filled 2023-04-03: qty 250

## 2023-04-03 MED ORDER — CETIRIZINE HCL 10 MG/ML IV SOLN
10.0000 mg | Freq: Once | INTRAVENOUS | Status: AC
  Administered 2023-04-03: 10 mg via INTRAVENOUS
  Filled 2023-04-03: qty 1

## 2023-04-03 MED ORDER — HEPARIN SOD (PORK) LOCK FLUSH 100 UNIT/ML IV SOLN
500.0000 [IU] | Freq: Once | INTRAVENOUS | Status: AC | PRN
  Administered 2023-04-03: 500 [IU]
  Filled 2023-04-03: qty 5

## 2023-04-03 MED ORDER — FAMOTIDINE IN NACL 20-0.9 MG/50ML-% IV SOLN
20.0000 mg | Freq: Once | INTRAVENOUS | Status: AC
  Administered 2023-04-03: 20 mg via INTRAVENOUS
  Filled 2023-04-03: qty 50

## 2023-04-03 MED ORDER — DEXAMETHASONE SODIUM PHOSPHATE 10 MG/ML IJ SOLN
10.0000 mg | Freq: Once | INTRAMUSCULAR | Status: AC
  Administered 2023-04-03: 10 mg via INTRAVENOUS
  Filled 2023-04-03: qty 1

## 2023-04-03 MED ORDER — PALONOSETRON HCL INJECTION 0.25 MG/5ML
0.2500 mg | Freq: Once | INTRAVENOUS | Status: AC
  Administered 2023-04-03: 0.25 mg via INTRAVENOUS
  Filled 2023-04-03: qty 5

## 2023-04-03 MED ORDER — SODIUM CHLORIDE 0.9 % IV SOLN
146.0000 mg | Freq: Once | INTRAVENOUS | Status: AC
  Administered 2023-04-03: 150 mg via INTRAVENOUS
  Filled 2023-04-03: qty 14.81

## 2023-04-03 MED ORDER — SODIUM CHLORIDE 0.9 % IV SOLN
50.0000 mg/m2 | Freq: Once | INTRAVENOUS | Status: AC
  Administered 2023-04-03: 78 mg via INTRAVENOUS
  Filled 2023-04-03: qty 13

## 2023-04-03 NOTE — Progress Notes (Signed)
Hematology/Oncology Consult note [facility]  [phone] [phone]  Patient Care Team: Jacky Kindle, FNP as PCP - General (Family Medicine) Debbe Odea, MD as PCP - Cardiology (Cardiology) Creig Hines, MD as Consulting Physician (Oncology) Leafy Ro, MD as Consulting Physician (General Surgery) Dasher, Cliffton Asters, MD (Dermatology) Scheeler, Kermit Balo, PA-C as Physician Assistant (Plastic Surgery) Minna Antis, MD as Attending Physician (Emergency Medicine) Alvira Philips, Leitha Bleak (Neurology) Dayna Barker, MD as Referring Physician (Internal Medicine) Coralyn Helling, MD (Inactive) as Consulting Physician (Pulmonary Disease) Gaspar Cola, RPH (Inactive) (Pharmacist) Pa, [facility] (Optometry) Glory Buff, RN as Oncology Nurse Navigator Carmina Miller, MD as Consulting Physician (Radiation Oncology)   Name of the patient: Azaylea Demby  [id number]  June 05, 1955   Date of visit: 04/03/23  Diagnosis- non-small cell carcinoma of unknown primary   Chief complaint/ Reason for visit-on treatment assessment prior to cycle 3 of weekly CarboTaxol chemotherapy  Heme/Onc history:  Oncology History Overview Note  1. Carcinoma of breast, T1, N0, M0 tumor diagnosis.  In January of 1997 2. Carcinoma of the vocal cord T1, N0, M0 tumor.  Status postradiation therapy 3. Abnormal CT scan of the chest (December, 2015) 4. Repeat CT scan of chest shows stable nodule (July, 2016) 5. Neutropenia with normal hemoglobin and normal platelet count (July, 2016)   Cancer of vocal cord [facility]) (Resolved)  11/10/2014 Initial Diagnosis   Cancer of vocal cord   Malignant neoplasm of lower-outer quadrant of right breast of female, estrogen receptor negative (HCC)  11/24/2017 Initial Diagnosis   Malignant neoplasm of lower-outer quadrant of right breast of female, estrogen receptor negative (HCC)   01/12/2018 Cancer Staging    Staging form: Breast, AJCC 8th Edition - Clinical stage from 01/12/2018: Stage IB (cT1a, cN0, cM0, G3, ER+, PR-, HER2-) - Signed by Creig Hines, MD on 01/12/2018   01/30/2018 - 04/26/2018 Chemotherapy   The patient had dexamethasone (DECADRON) 4 MG tablet, 8 mg, Oral, 2 times daily, 1 of 1 cycle, Start date: 01/12/2018, End date: 08/03/2018 palonosetron (ALOXI) injection 0.25 mg, 0.25 mg, Intravenous,  Once, 4 of 4 cycles Administration: 0.25 mg (01/30/2018), 0.25 mg (02/27/2018), 0.25 mg (03/28/2018), 0.25 mg (04/26/2018) pegfilgrastim (NEULASTA ONPRO KIT) injection 6 mg, 6 mg, Subcutaneous, Once, 4 of 4 cycles Administration: 6 mg (01/30/2018), 6 mg (02/27/2018), 6 mg (03/28/2018), 6 mg (04/26/2018) cyclophosphamide (CYTOXAN) 1,000 mg in sodium chloride 0.9 % 250 mL chemo infusion, 600 mg/m2 = 1,000 mg, Intravenous,  Once, 4 of 4 cycles Administration: 1,000 mg (01/30/2018), 1,000 mg (02/27/2018), 1,000 mg (03/28/2018), 1,000 mg (04/26/2018) DOCEtaxel (TAXOTERE) 130 mg in sodium chloride 0.9 % 250 mL chemo infusion, 75 mg/m2 = 130 mg, Intravenous,  Once, 4 of 4 cycles Dose modification: 60 mg/m2 (original dose 75 mg/m2, Cycle 2, Reason: Dose not tolerated) Administration: 130 mg (01/30/2018), 100 mg (02/27/2018), 100 mg (03/28/2018), 100 mg (04/26/2018)  for chemotherapy treatment.    Non-small cell lung cancer (HCC)  03/09/2023 Initial Diagnosis   Non-small cell lung cancer (HCC)   03/20/2023 -  Chemotherapy   Patient is on Treatment Plan : LUNG Carboplatin + Paclitaxel q7d       The patient was originally diagnosed with right breast cancer in 1997, 10 mm grade 3, ER pr positive her 2 negative, node negative status post surgery and radiation treatment only. She never received chemotherapy or hormone therapy. For her vocal cord cancer, she had extensive surgery and radiation therapy  in 1998. Due to past history of smoking, she was also noted to have lung nodules. She is undergoing active  surveillance imaging study of the chest.   Patient noted to have adrenal mass on CT chest which prompted PET CT in July 2018 which showed: IMPRESSION: 1. Right adrenal mass with peripheral hypermetabolism. Given interval development since 11/18/2015, suspicious for either isolated metastasis or a primary adrenal neoplasm. This should be considered for biopsy. 2. No evidence of hypermetabolic primary malignancy or extraadrenal metastasis. Pulmonary nodules are not significantly hypermetabolic. 3. Coronary artery atherosclerosis. Aortic Atherosclerosis (ICD10-I70.0). 4. Hepatic steatosis. 5. Left sixth rib hypermetabolism is favored to be related to remote Trauma.   Patient underwent right adrenalectomy which showed: DIAGNOSIS:  A. ADRENAL GLAND, RIGHT; ADRENALECTOMY:  - HIGH GRADE CARCINOMA WITH EXTENSIVE NECROSIS.  - MARGINS OF THE SPECIMEN ARE NEGATIVE FOR CARCINOMA.   Comment:  Sections demonstrate abundant necrosis confined to the adrenal medullary  compartment. In one block of tissue a small fragment of viable malignant  neoplasm was identified. A panel of immunohistochemical stains was  performed with the following pattern of immunoreactivity:  Super pancytokeratin: Positive  GATA-3: Negative  TTF-1: Negative  P40: Negative  Napsin: Negative  PAX-8: Negative  Chromogranin: Negative     Repeat CT chest abdomen and pelvis in July 2019 showed stable left lower lobe pulmonary nodule.  No other evidence of metastatic disease.  New 4.5 mm nodule in the right breast.  This was followed by ultrasound mammogram and core biopsy of the breast lesion which was a grade 3 triple negative breast cancer.  Patient was seen by Dr. Lemar Livings and underwent lumpectomy on 12/08/2017.   Patient had a 5 mm weakly ER positive tumor.  Given her ongoing fatigue Oncotype testing was done to see if she could potentially avoid chemotherapy.  Oncotype shows a recurrence score of 53 with her risk of distant  recurrence at 9 years at greater than 39% with hormone therapy alone.  Absolute benefit of chemotherapy was greater than 15% in her age group.  Patient completed 4 cycles of adjuvant TC chemotherapy in December 2019    Patient has known bilateral pulmonary nodules which have been monitored and she was again found to have a left lower lobe lung nodule measuring 13 mm unchanged as compared to prior exams.  There were also new subcentimeter lung nodules detected and follow-up CT was recommended.  CT angio chest in August 2024 showed increase in the size of left upper lobe lung nodule and new multiple pulmonary nodules concerning for metastatic disease.  This was followed by a PET CT scan which showed hypermetabolic intrathoracic nodal adenopathy measuring 5 to 13 mm in size along with lung nodules with a maximum size measuring 13 mm with an SUV of 9.2.   CT-guided biopsy was nondiagnostic she is patient underwent bronchoscopy but lymph nodes were not sampled at that time due to equipment malfunction.Bronchial lavage from left upper lobe was positive for malignancy non-small cell carcinoma.  But no further subclassification whether this is primary lung cancer or metastatic carcinoma could be done due to limited specimen. EBUS station 7 LN also shows non small cell carcinoma      Interval history-reports occasional symptoms of heartburn since starting radiation.  Appetite is fair  ECOG PS- 2 Pain scale- 3 Opioid associated constipation- no  Review of systems- Review of Systems  Constitutional:  Positive for malaise/fatigue. Negative for chills, fever and weight loss.  HENT:  Negative for congestion,  ear discharge and nosebleeds.   Eyes:  Negative for blurred vision.  Respiratory:  Negative for cough, hemoptysis, sputum production, shortness of breath and wheezing.   Cardiovascular:  Negative for chest pain, palpitations, orthopnea and claudication.  Gastrointestinal:  Positive for heartburn. Negative  for abdominal pain, blood in stool, constipation, diarrhea, melena, nausea and vomiting.  Genitourinary:  Negative for dysuria, flank pain, frequency, hematuria and urgency.  Musculoskeletal:  Negative for back pain, joint pain and myalgias.  Skin:  Negative for rash.  Neurological:  Negative for dizziness, tingling, focal weakness, seizures, weakness and headaches.  Endo/Heme/Allergies:  Does not bruise/bleed easily.  Psychiatric/Behavioral:  Negative for depression and suicidal ideas. The patient does not have insomnia.       Allergies  Allergen Reactions   Sulfa Antibiotics Other (See Comments)    Mouth sore and raw     Past Medical History:  Diagnosis Date   Adenomatous colon polyp    Adrenal carcinoma, right (HCC) 12/28/2016   a.) high grade carcinoma with extensive necrosis --> s/p adrenalectomy   Anxiety    Aortic atherosclerosis (HCC)    Arthritis of right knee    Atrial fibrillation and flutter (HCC)    a.) CHA2DS2-VASc = 3 (age, sex, vascular disease history) as of 02/13/2023; b.) cardiac rate/rhythm maintained intrinsically without pharmacological intervention; no chronic OAC (does take low dose ASA)   B12 deficiency    Breast cancer (HCC) 1997   a.) stage IB (cT1a, cN0, cM0, G3, ER+, PR-, HER2-); s/p lumpectomy + XRT   COPD (chronic obstructive pulmonary disease) (HCC)    Coronary artery disease    a.) cCTA 12/09/2021: Ca2+ = 135 (80th %'ile for age/sex/race match contol) --> 25-49% pLAD and RCA distributions   Depression    Diastolic dysfunction    a.) TTE 12/11/2020, no RWMAs, norm RVSF, G1DD; b.) TTE 12/14/2021: EF 55-60, no RWMAs, norm RVSF, G1DD; c.) TTE 01/27/2023: EF 50-55%, no RWMAs, G1DD, mild RAE, mild MR, mod TR   Dry cough 03/02/2017   Dyspnea    Full dentures    GERD (gastroesophageal reflux disease)    History of hiatal hernia    HLD (hyperlipidemia)    Invasive carcinoma of breast (HCC) 11/15/2017   a.) path (+) for G3, ER -, PR -, HER2/neu - ;  s/p lumpectomy 01/17/2018 + 4 cycles TC systemic chemotherapy + XRT   Long term current use of aspirin    Personal history of chemotherapy    Personal history of radiation therapy    Pneumothorax of left lung after biopsy 01/25/2023   a.) hypoxic in PACU requiring re-intubation --> transferred to ICU --> chest tube placed by PCCM   RLS (restless legs syndrome)    a.) on ropinirole   Skin cancer    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   a.) Stage IB (cT1a, cN0, cM0, G3, ER+, PR-, HER2-)     Past Surgical History:  Procedure Laterality Date   BIOPSY  01/05/2023   Procedure: BIOPSY;  Surgeon: Toney Reil, MD;  Location: [facility];  Service: Gastroenterology;;   BREAST BIOPSY Right 1997   positive   BREAST BIOPSY Right 11/15/2017   INVASIVE MAMMARY CARCINOMA triple negative   BREAST BIOPSY Right 07/20/2020   [place] bx, Q marker, neg   BREAST LUMPECTOMY Right 1997   2019 also   BREAST LUMPECTOMY WITH SENTINEL LYMPH NODE BIOPSY Right 12/08/2017   Procedure: BREAST LUMPECTOMY WITH SENTINEL LYMPH NODE BX;  Surgeon: Lemar Livings,  Merrily Pew, MD;  Location: [facility];  Service: General;  Laterality: Right;   BREAST REDUCTION WITH MASTOPEXY Left 06/10/2019   Procedure: Left breast mastopexy reduction for symmetry;  Surgeon: Peggye Form, DO;  Location: [facility];  Service: Plastics;  Laterality: Left;  total 2.5 hours   CATARACT EXTRACTION W/PHACO Right 02/07/2022   Procedure: CATARACT EXTRACTION PHACO AND INTRAOCULAR LENS PLACEMENT (IOC) RIGHT DIABETIC;  Surgeon: Nevada Crane, MD;  Location: [facility];  Service: Ophthalmology;  Laterality: Right;  3.60 00:34.7   CATARACT EXTRACTION W/PHACO Left 02/28/2022   Procedure: CATARACT EXTRACTION PHACO AND INTRAOCULAR LENS PLACEMENT (IOC) LEFT DIABETIC 4.11 00:26.1;  Surgeon: Nevada Crane, MD;  Location: [facility];  Service: Ophthalmology;  Laterality: Left;   COLONOSCOPY  2015    COLONOSCOPY N/A 01/05/2023   Procedure: COLONOSCOPY;  Surgeon: Toney Reil, MD;  Location: [facility];  Service: Gastroenterology;  Laterality: N/A;   COLONOSCOPY WITH PROPOFOL N/A 10/29/2019   Procedure: COLONOSCOPY WITH PROPOFOL;  Surgeon: Midge Minium, MD;  Location: [facility];  Service: Endoscopy;  Laterality: N/A;   ESOPHAGOGASTRODUODENOSCOPY (EGD) WITH PROPOFOL N/A 05/24/2019   Procedure: ESOPHAGOGASTRODUODENOSCOPY (EGD) WITH PROPOFOL;  Surgeon: Midge Minium, MD;  Location: [facility] END[facility]COPY;  Service: Endoscopy;  Laterality: N/A;   ESOPHAGOGASTRODUODENOSCOPY (EGD) WITH PROPOFOL N/A 09/06/2022   Procedure: ESOPHAGOGASTRODUODENOSCOPY (EGD) WITH PROPOFOL;  Surgeon: Midge Minium, MD;  Location: [facility];  Service: Endoscopy;  Laterality: N/A;   ESOPHAGOGASTRODUODENOSCOPY (EGD) WITH PROPOFOL  01/05/2023   Procedure: ESOPHAGOGASTRODUODENOSCOPY (EGD) WITH PROPOFOL;  Surgeon: Toney Reil, MD;  Location: [facility];  Service: Gastroenterology;;   ESOPHAGUS SURGERY     IR IMAGING GUIDED PORT INSERTION  02/07/2023   KNEE SURGERY Right    LIPOSUCTION WITH LIPOFILLING Right 06/10/2019   Procedure: right breast scar and capsule release with fat grafting;  Surgeon: Peggye Form, DO;  Location: [facility];  Service: Plastics;  Laterality: Right;   POLYPECTOMY  01/05/2023   Procedure: POLYPECTOMY;  Surgeon: Toney Reil, MD;  Location: [facility];  Service: Gastroenterology;;   [facility] REMOVAL     PORTACATH PLACEMENT Right 01/17/2018   Procedure: INSERTION PORT-A-CATH- RIGHT;  Surgeon: Earline Mayotte, MD;  Location: [facility];  Service: General;  Laterality: Right;   ROBOTIC ADRENALECTOMY Right 12/28/2016   Procedure: ROBOTIC ADRENALECTOMY;  Surgeon: Vanna Scotland, MD;  Location: [facility];  Service: Urology;  Laterality: Right;   SKIN CANCER EXCISION     THROAT SURGERY  1998   throat cancer    TUBAL LIGATION     VENTRAL HERNIA REPAIR  N/A 03/03/2017   Procedure: HERNIA REPAIR VENTRAL ADULT;  Surgeon: Ricarda Frame, MD;  Location: [facility];  Service: General;  Laterality: N/A;   VIDEO BRONCHOSCOPY WITH ENDOBRONCHIAL ULTRASOUND N/A 01/25/2023   Procedure: VIDEO BRONCHOSCOPY WITH ENDOBRONCHIAL ULTRASOUND;  Surgeon: Vida Rigger, MD;  Location: [facility];  Service: Thoracic;  Laterality: N/A;   VIDEO BRONCHOSCOPY WITH ENDOBRONCHIAL ULTRASOUND N/A 02/17/2023   Procedure: VIDEO BRONCHOSCOPY WITH ENDOBRONCHIAL ULTRASOUND;  Surgeon: Vida Rigger, MD;  Location: [facility];  Service: Thoracic;  Laterality: N/A;    Social History   Socioeconomic History   Marital status: Married    Spouse name: Kamry, Blakney (Spouse) [phone] ([place])   Number of children: 4   Years of education: Not on file   Highest education level: 10th grade  Occupational History   Occupation: retired  Tobacco Use   Smoking status: Former    Current packs/day:  0.00    Average packs/day: 1.5 packs/day for 30.0 years (45.0 ttl pk-yrs)    Types: Cigarettes    Start date: 05/02/1965    Quit date: 05/03/1995    Years since quitting: 27.9    Passive exposure: Past   Smokeless tobacco: Never  Vaping Use   Vaping status: Never Used  Substance and Sexual Activity   Alcohol use: Not Currently    Alcohol/week: 4.0 standard drinks of alcohol    Types: 4 Shots of liquor per week    Comment: none   Drug use: No   Sexual activity: Not Currently    Birth control/protection: Post-menopausal  Other Topics Concern   Not on file  Social History Narrative   Not on file   Social Determinants of Health   Financial Resource Strain: Low Risk  (12/02/2021)   Overall Financial Resource Strain (CARDIA)    Difficulty of Paying Living Expenses: Not very hard  Food Insecurity: No Food Insecurity (12/05/2022)   Hunger Vital Sign    Worried About Running Out of Food in the Last Year: Never true    Ran Out of Food in the Last Year: Never true  Transportation Needs: No  Transportation Needs (12/05/2022)   PRAPARE - Administrator, Civil Service (Medical): No    Lack of Transportation (Non-Medical): No  Physical Activity: Insufficiently Active (12/05/2022)   Exercise Vital Sign    Days of Exercise per Week: 3 days    Minutes of Exercise per Session: 30 min  Stress: Stress Concern Present (12/05/2022)   [employer] of Occupational Health - Occupational Stress Questionnaire    Feeling of Stress : Rather much  Social Connections: Moderately Isolated (12/05/2022)   Social Connection and Isolation Panel [NHANES]    Frequency of Communication with Friends and Family: Three times a week    Frequency of Social Gatherings with Friends and Family: Twice a week    Attends Religious Services: Never    Database administrator or Organizations: No    Attends Banker Meetings: Never    Marital Status: Married  Catering manager Violence: Not At Risk (12/05/2022)   Humiliation, Afraid, Rape, and Kick questionnaire    Fear of Current or Ex-Partner: No    Emotionally Abused: No    Physically Abused: No    Sexually Abused: No    Family History  Problem Relation Age of Onset   Diabetes Mother    Heart attack Mother    Cancer Sister        cancer of eyes, spread to lung, other places. died at 76   Cancer Sister 73       unknown primary, spread to bone, brain died in 2's   Cancer Paternal Aunt        unk type   Cancer Paternal Grandmother        type unk   Prostate cancer Neg Hx    Kidney cancer Neg Hx    Bladder Cancer Neg Hx    Breast cancer Neg Hx      Current Outpatient Medications:    aspirin EC 81 MG tablet, Take 1 tablet (81 mg total) by mouth daily. Swallow whole., Disp: 90 tablet, Rfl: 3   benzonatate (TESSALON) 100 MG capsule, Take 2 capsules (200 mg total) by mouth 2 (two) times daily as needed for cough., Disp: 180 capsule, Rfl: 0   buPROPion (WELLBUTRIN XL) 150 MG 24 hr tablet, Take 3 tablets (450 mg total) by  mouth daily.,  Disp: 90 tablet, Rfl: 1   busPIRone (BUSPAR) 5 MG tablet, Take 1 tablet (5 mg total) by mouth 3 (three) times daily., Disp: 270 tablet, Rfl: 0   ciprofloxacin (CIPRO) 250 MG tablet, Take 1 tablet (250 mg total) by mouth 2 (two) times daily., Disp: 14 tablet, Rfl: 0   cyclobenzaprine (FLEXERIL) 5 MG tablet, TAKE 1 TABLET BY MOUTH AT BEDTIME, Disp: 30 tablet, Rfl: 0   dexamethasone (DECADRON) 4 MG tablet, Take 2 tablets daily for 2 days, start the day after chemotherapy. Take with food., Disp: 30 tablet, Rfl: 1   DULoxetine (CYMBALTA) 60 MG capsule, Take 1 capsule (60 mg total) by mouth 2 (two) times daily., Disp: 180 capsule, Rfl: 0   lidocaine (LIDODERM) 5 %, Place 1 patch onto the skin daily. Remove & Discard patch within 12 hours or as directed by MD, Disp: 30 patch, Rfl: 0   lidocaine-prilocaine (EMLA) cream, Apply to affected area once, Disp: 30 g, Rfl: 3   meloxicam (MOBIC) 7.5 MG tablet, TAKE 1 TABLET BY MOUTH DAILY, Disp: 30 tablet, Rfl: 0   montelukast (SINGULAIR) 10 MG tablet, Take 1 tablet (10 mg total) by mouth at bedtime., Disp: 90 tablet, Rfl: 3   omeprazole (PRILOSEC) 20 MG capsule, Take 1 capsule (20 mg total) by mouth 2 (two) times daily before a meal., Disp: 60 capsule, Rfl: 11   ondansetron (ZOFRAN) 8 MG tablet, Take 1 tablet (8 mg total) by mouth every 8 (eight) hours as needed for nausea or vomiting. Start on the third day after chemotherapy., Disp: 30 tablet, Rfl: 1   oxyCODONE (ROXICODONE) 5 MG immediate release tablet, Take 1 tablet (5 mg total) by mouth every 8 (eight) hours as needed for severe pain or moderate pain., Disp: 60 tablet, Rfl: 0   predniSONE (DELTASONE) 50 MG tablet, Take 1 tablet (50 mg total) by mouth daily with breakfast., Disp: 3 tablet, Rfl: 0   prochlorperazine (COMPAZINE) 10 MG tablet, Take 1 tablet (10 mg total) by mouth every 6 (six) hours as needed for nausea or vomiting., Disp: 30 tablet, Rfl: 1   rOPINIRole (REQUIP) 0.25 MG tablet, TAKE 1 TABLET BY  MOUTH AT BEDTIME, Disp: 90 tablet, Rfl: 1   rosuvastatin (CRESTOR) 40 MG tablet, TAKE 1 TABLET BY MOUTH DAILY, Disp: 90 tablet, Rfl: 1   sucralfate (CARAFATE) 1 g tablet, Take 1 tablet (1 g total) by mouth 3 (three) times daily before meals., Disp: 90 tablet, Rfl: 4   tolterodine (DETROL LA) 4 MG 24 hr capsule, Take 1 capsule (4 mg total) by mouth daily., Disp: 30 capsule, Rfl: 11 No current facility-administered medications for this visit.  Facility-Administered Medications Ordered in Other Visits:    0.9 %  sodium chloride infusion, , Intravenous, Continuous, Creig Hines, MD, Last Rate: 10 mL/hr at 04/03/23 1027, New Bag at 04/03/23 1027   CARBOplatin (PARAPLATIN) 150 mg in sodium chloride 0.9 % 100 mL chemo infusion, 150 mg, Intravenous, Once, Creig Hines, MD   cyanocobalamin ((VITAMIN B-12)) injection 1,000 mcg, 1,000 mcg, Intramuscular, Q30 days, Creig Hines, MD, 1,000 mcg at 08/27/21 1537   cyanocobalamin (VITAMIN B12) injection 1,000 mcg, 1,000 mcg, Intramuscular, Q30 days, Creig Hines, MD, 1,000 mcg at 03/16/22 1101   famotidine (PEPCID) IVPB 20 mg premix, 20 mg, Intravenous, Once, Creig Hines, MD, Last Rate: 200 mL/hr at 04/03/23 1104, 20 mg at 04/03/23 1104   heparin lock flush 100 unit/mL, 500 Units, Intracatheter, Once PRN, Smith Robert,  Pete Glatter, MD   PACLitaxel (TAXOL) 78 mg in sodium chloride 0.9 % 250 mL chemo infusion (</= 80mg /m2), 50 mg/m2 (Treatment Plan Recorded), Intravenous, Once, Creig Hines, MD  Physical exam:  Vitals:   04/03/23 0948  BP: 114/80  Pulse: 95  Resp: 18  Temp: (!) 95 F (35 C)  TempSrc: Tympanic  SpO2: 100%  Weight: 119 lb 12.8 oz (54.3 kg)   Physical Exam Cardiovascular:     Rate and Rhythm: Normal rate and regular rhythm.     Heart sounds: Normal heart sounds.  Pulmonary:     Effort: Pulmonary effort is normal.     Breath sounds: Normal breath sounds.  Skin:    General: Skin is warm and dry.  Neurological:     Mental Status: She  is alert and oriented to person, place, and time.         Latest Ref Rng & Units 04/03/2023    9:34 AM  CMP  Glucose 70 - 99 mg/dL 161   BUN 8 - 23 mg/dL 15   Creatinine 0.96 - 1.00 mg/dL 0.45   Sodium 409 - 811 mmol/L 137   Potassium 3.5 - 5.1 mmol/L 3.6   Chloride 98 - 111 mmol/L 100   CO2 22 - 32 mmol/L 27   Calcium 8.9 - 10.3 mg/dL 8.5   Total Protein 6.5 - 8.1 g/dL 6.1   Total Bilirubin <9.1 mg/dL 0.7   Alkaline Phos 38 - 126 U/L 64   AST 15 - 41 U/L 25   ALT 0 - 44 U/L 24       Latest Ref Rng & Units 04/03/2023    9:34 AM  CBC  WBC 4.0 - 10.5 K/uL 3.0   Hemoglobin 12.0 - 15.0 g/dL 47.8   Hematocrit 29.5 - 46.0 % 40.1   Platelets 150 - 400 K/uL 197     No images are attached to the encounter.  DG Lumbar Spine 2-3 Views  Result Date: 03/29/2023 CLINICAL DATA:  Left-sided mid to low back pain.  No known injury. EXAM: LUMBAR SPINE - 2-3 VIEW COMPARISON:  CT abdomen pelvis, 12/09/2021. FINDINGS: No fracture or bone lesion. No spondylolisthesis. Mild curvature, convex the left. Transitional lumbosacral vertebra designated L5. Slight anterolisthesis of L3 on L4, 3 mm. No other spondylolisthesis. Mild loss of disc height at L1-L2, L2-L3 and L3-L4. Calcification noted along the abdominal aorta. Soft tissues otherwise unremarkable. IMPRESSION: 1. No fracture or acute finding. 2. Degenerative changes as detailed. Electronically Signed   By: Amie Portland M.D.   On: 03/29/2023 14:17   DG Thoracic Spine 2 View  Result Date: 03/29/2023 CLINICAL DATA:  Left-sided mid to low back pain.  No injury. EXAM: THORACIC SPINE 2 VIEWS COMPARISON:  CT abdomen and pelvis, 12/09/2021. FINDINGS: No fracture or bone lesion. Transitional lumbosacral vertebra, designated S1. 2-3 mm anterolisthesis of L3 on L4. Mild curvature, convex the left, of the mid lumbar spine. Mild loss of disc height at L1-L2, L2-L3 and L3-L4. Scattered calcifications noted along the abdominal aorta. IMPRESSION: 1. No fracture  or acute finding. Degenerative changes as detailed. Stable appearance compared to the prior abdomen and pelvis CT. Electronically Signed   By: Amie Portland M.D.   On: 03/29/2023 13:56   MM 3D SCREENING MAMMOGRAM BILATERAL BREAST  Result Date: 03/13/2023 CLINICAL DATA:  Screening. EXAM: DIGITAL SCREENING BILATERAL MAMMOGRAM WITH TOMOSYNTHESIS AND CAD TECHNIQUE: Bilateral screening digital craniocaudal and mediolateral oblique mammograms were obtained. Bilateral screening digital breast tomosynthesis was  performed. The images were evaluated with computer-aided detection. COMPARISON:  Previous exam(s). ACR Breast Density Category b: There are scattered areas of fibroglandular density. FINDINGS: There are no findings suspicious for malignancy. IMPRESSION: No mammographic evidence of malignancy. A result letter of this screening mammogram will be mailed directly to the patient. RECOMMENDATION: Screening mammogram in one year. (Code:[id number]) BI-RADS CATEGORY  1: Negative. Electronically Signed   By: Amie Portland M.D.   On: 03/13/2023 13:49     Assessment and plan- Patient is a 67 y.o. female with history of head and neck, breast cancer as well as adrenal carcinoma of unknown primary in the past now with non-small cell carcinoma of unknown primary predominantly with mediastinal adenopathy.  She is being treated as possible non-small cell lung cancer.  She is here for on treatment assessment prior to cycle 3 of weekly CarboTaxol chemotherapy  Counts okay to proceed with cycle 3 of weekly carbo XL chemotherapy today.  She will directly proceed for cycle 4 next week and I will see her back in 2 weeks for cycle 5 of weekly CarboTaxol chemotherapy.  White cell count is mildly low at 3 today with an ANC of 1.7.  This has decreased as compared to her prior week when her white count was 6.5 with an ANC of 4.  We will plan to give her 3 days of Zarzio starting tomorrow if her insurance approves.  Heartburn: Possibly  secondary to radiation esophagitis.  She is currently on omeprazole twice daily.  If symptoms worsen we will add sucralfate   Visit Diagnosis 1. Non-small cell cancer of left lung (HCC)   2. Chemotherapy induced neutropenia (HCC)   3. Encounter for antineoplastic chemotherapy      Dr. Owens Shark, MD, MPH [facility] at [facility] [phone] 04/03/2023 11:17 AM

## 2023-04-03 NOTE — Patient Instructions (Signed)
  [facility] - A DEPT OF [facility]  Discharge Instructions: Thank you for choosing [facility] to provide your oncology and hematology care.  If you have a lab appointment with the Cancer Center, please go directly to the Cancer Center and check in at the registration area.  Wear comfortable clothing and clothing appropriate for easy access to any Portacath or PICC line.   We strive to give you quality time with your provider. You may need to reschedule your appointment if you arrive late (15 or more minutes).  Arriving late affects you and other patients whose appointments are after yours.  Also, if you miss three or more appointments without notifying the office, you may be dismissed from the clinic at the provider's discretion.      For prescription refill requests, have your pharmacy contact our office and allow 72 hours for refills to be completed.    Today you received the following chemotherapy and/or immunotherapy agents Taxol and Carboplatin       To help prevent nausea and vomiting after your treatment, we encourage you to take your nausea medication as directed.  BELOW ARE SYMPTOMS THAT SHOULD BE REPORTED IMMEDIATELY: *FEVER GREATER THAN 100.4 F (38 C) OR HIGHER *CHILLS OR SWEATING *NAUSEA AND VOMITING THAT IS NOT CONTROLLED WITH YOUR NAUSEA MEDICATION *UNUSUAL SHORTNESS OF BREATH *UNUSUAL BRUISING OR BLEEDING *URINARY PROBLEMS (pain or burning when urinating, or frequent urination) *BOWEL PROBLEMS (unusual diarrhea, constipation, pain near the anus) TENDERNESS IN MOUTH AND THROAT WITH OR WITHOUT PRESENCE OF ULCERS (sore throat, sores in mouth, or a toothache) UNUSUAL RASH, SWELLING OR PAIN  UNUSUAL VAGINAL DISCHARGE OR ITCHING   Items with * indicate a potential emergency and should be followed up as soon as possible or go to the Emergency Department if any problems should occur.  Please show the CHEMOTHERAPY ALERT CARD or  IMMUNOTHERAPY ALERT CARD at check-in to the Emergency Department and triage nurse.  Should you have questions after your visit or need to cancel or reschedule your appointment, please contact [facility] - A DEPT OF [facility]  [phone] and follow the prompts.  Office hours are 8:00 a.m. to 4:30 p.m. Monday - Friday. Please note that voicemails left after 4:00 p.m. may not be returned until the following business day.  We are closed weekends and major holidays. You have access to a nurse at all times for urgent questions. Please call the main number to the clinic [phone] and follow the prompts.  For any non-urgent questions, you may also contact your provider using MyChart. We now offer e-Visits for anyone 31 and older to request care online for non-urgent symptoms. For details visit [email or web address].   Also download the MyChart app! Go to the app store, search "MyChart", open the app, select [facility], and log in with your MyChart username and password.

## 2023-04-04 ENCOUNTER — Ambulatory Visit
Admission: RE | Admit: 2023-04-04 | Discharge: 2023-04-04 | Disposition: A | Discharge status: Home / Self Care | Payer: Medicare HMO | Source: Ambulatory Visit | Attending: Radiation Oncology | Admitting: Radiation Oncology

## 2023-04-04 ENCOUNTER — Inpatient Hospital Stay: Payer: Medicare HMO

## 2023-04-04 ENCOUNTER — Other Ambulatory Visit: Payer: Self-pay

## 2023-04-04 DIAGNOSIS — B379 Candidiasis, unspecified: Secondary | ICD-10-CM | POA: Diagnosis not present

## 2023-04-04 DIAGNOSIS — K208 Other esophagitis without bleeding: Secondary | ICD-10-CM | POA: Diagnosis not present

## 2023-04-04 DIAGNOSIS — C3412 Malignant neoplasm of upper lobe, left bronchus or lung: Secondary | ICD-10-CM | POA: Diagnosis not present

## 2023-04-04 DIAGNOSIS — L270 Generalized skin eruption due to drugs and medicaments taken internally: Secondary | ICD-10-CM | POA: Diagnosis not present

## 2023-04-04 DIAGNOSIS — C50511 Malignant neoplasm of lower-outer quadrant of right female breast: Secondary | ICD-10-CM | POA: Diagnosis not present

## 2023-04-04 DIAGNOSIS — R12 Heartburn: Secondary | ICD-10-CM | POA: Diagnosis not present

## 2023-04-04 DIAGNOSIS — E538 Deficiency of other specified B group vitamins: Secondary | ICD-10-CM

## 2023-04-04 DIAGNOSIS — Z5111 Encounter for antineoplastic chemotherapy: Secondary | ICD-10-CM | POA: Diagnosis not present

## 2023-04-04 DIAGNOSIS — Z853 Personal history of malignant neoplasm of breast: Secondary | ICD-10-CM | POA: Diagnosis not present

## 2023-04-04 DIAGNOSIS — Z87891 Personal history of nicotine dependence: Secondary | ICD-10-CM | POA: Diagnosis not present

## 2023-04-04 DIAGNOSIS — Z23 Encounter for immunization: Secondary | ICD-10-CM

## 2023-04-04 DIAGNOSIS — Z510 Encounter for antineoplastic radiation therapy: Secondary | ICD-10-CM | POA: Diagnosis not present

## 2023-04-04 LAB — RAD ONC ARIA SESSION SUMMARY
Course Elapsed Days: 13
Plan Fractions Treated to Date: 8
Plan Prescribed Dose Per Fraction: 2 Gy
Plan Total Fractions Prescribed: 33
Plan Total Prescribed Dose: 66 Gy
Reference Point Dosage Given to Date: 16 Gy
Reference Point Session Dosage Given: 2 Gy
Session Number: 8

## 2023-04-04 MED ORDER — FILGRASTIM-SNDZ 480 MCG/0.8ML IJ SOSY
480.0000 ug | PREFILLED_SYRINGE | Freq: Once | INTRAMUSCULAR | Status: AC
  Administered 2023-04-04: 480 ug via SUBCUTANEOUS
  Filled 2023-04-04: qty 0.8

## 2023-04-05 ENCOUNTER — Inpatient Hospital Stay: Payer: Medicare HMO

## 2023-04-05 ENCOUNTER — Other Ambulatory Visit: Payer: Self-pay

## 2023-04-05 ENCOUNTER — Ambulatory Visit
Admission: RE | Admit: 2023-04-05 | Discharge: 2023-04-05 | Disposition: A | Discharge status: Home / Self Care | Payer: Medicare HMO | Source: Ambulatory Visit | Attending: Radiation Oncology | Admitting: Radiation Oncology

## 2023-04-05 DIAGNOSIS — Z853 Personal history of malignant neoplasm of breast: Secondary | ICD-10-CM | POA: Diagnosis not present

## 2023-04-05 DIAGNOSIS — B379 Candidiasis, unspecified: Secondary | ICD-10-CM | POA: Diagnosis not present

## 2023-04-05 DIAGNOSIS — E538 Deficiency of other specified B group vitamins: Secondary | ICD-10-CM

## 2023-04-05 DIAGNOSIS — Z87891 Personal history of nicotine dependence: Secondary | ICD-10-CM | POA: Diagnosis not present

## 2023-04-05 DIAGNOSIS — R12 Heartburn: Secondary | ICD-10-CM | POA: Diagnosis not present

## 2023-04-05 DIAGNOSIS — L270 Generalized skin eruption due to drugs and medicaments taken internally: Secondary | ICD-10-CM | POA: Diagnosis not present

## 2023-04-05 DIAGNOSIS — C50511 Malignant neoplasm of lower-outer quadrant of right female breast: Secondary | ICD-10-CM | POA: Diagnosis not present

## 2023-04-05 DIAGNOSIS — Z510 Encounter for antineoplastic radiation therapy: Secondary | ICD-10-CM | POA: Diagnosis not present

## 2023-04-05 DIAGNOSIS — K208 Other esophagitis without bleeding: Secondary | ICD-10-CM | POA: Diagnosis not present

## 2023-04-05 DIAGNOSIS — Z5111 Encounter for antineoplastic chemotherapy: Secondary | ICD-10-CM | POA: Diagnosis not present

## 2023-04-05 DIAGNOSIS — C3412 Malignant neoplasm of upper lobe, left bronchus or lung: Secondary | ICD-10-CM | POA: Diagnosis not present

## 2023-04-05 DIAGNOSIS — Z23 Encounter for immunization: Secondary | ICD-10-CM

## 2023-04-05 LAB — RAD ONC ARIA SESSION SUMMARY
Course Elapsed Days: 14
Plan Fractions Treated to Date: 9
Plan Prescribed Dose Per Fraction: 2 Gy
Plan Total Fractions Prescribed: 33
Plan Total Prescribed Dose: 66 Gy
Reference Point Dosage Given to Date: 18 Gy
Reference Point Session Dosage Given: 2 Gy
Session Number: 9

## 2023-04-05 MED ORDER — FILGRASTIM-SNDZ 480 MCG/0.8ML IJ SOSY
480.0000 ug | PREFILLED_SYRINGE | Freq: Every day | INTRAMUSCULAR | Status: DC
  Administered 2023-04-05: 480 ug via SUBCUTANEOUS
  Filled 2023-04-05: qty 0.8

## 2023-04-06 ENCOUNTER — Inpatient Hospital Stay: Payer: Medicare HMO

## 2023-04-06 ENCOUNTER — Ambulatory Visit
Admission: RE | Admit: 2023-04-06 | Discharge: 2023-04-06 | Disposition: A | Discharge status: Home / Self Care | Payer: Medicare HMO | Source: Ambulatory Visit | Attending: Radiation Oncology | Admitting: Radiation Oncology

## 2023-04-06 ENCOUNTER — Other Ambulatory Visit: Payer: Self-pay

## 2023-04-06 DIAGNOSIS — E538 Deficiency of other specified B group vitamins: Secondary | ICD-10-CM

## 2023-04-06 DIAGNOSIS — Z510 Encounter for antineoplastic radiation therapy: Secondary | ICD-10-CM | POA: Diagnosis not present

## 2023-04-06 DIAGNOSIS — C3412 Malignant neoplasm of upper lobe, left bronchus or lung: Secondary | ICD-10-CM | POA: Diagnosis not present

## 2023-04-06 DIAGNOSIS — Z853 Personal history of malignant neoplasm of breast: Secondary | ICD-10-CM | POA: Diagnosis not present

## 2023-04-06 DIAGNOSIS — L270 Generalized skin eruption due to drugs and medicaments taken internally: Secondary | ICD-10-CM | POA: Diagnosis not present

## 2023-04-06 DIAGNOSIS — Z5111 Encounter for antineoplastic chemotherapy: Secondary | ICD-10-CM | POA: Diagnosis not present

## 2023-04-06 DIAGNOSIS — Z87891 Personal history of nicotine dependence: Secondary | ICD-10-CM | POA: Diagnosis not present

## 2023-04-06 DIAGNOSIS — K208 Other esophagitis without bleeding: Secondary | ICD-10-CM | POA: Diagnosis not present

## 2023-04-06 DIAGNOSIS — C50511 Malignant neoplasm of lower-outer quadrant of right female breast: Secondary | ICD-10-CM | POA: Diagnosis not present

## 2023-04-06 DIAGNOSIS — B379 Candidiasis, unspecified: Secondary | ICD-10-CM | POA: Diagnosis not present

## 2023-04-06 DIAGNOSIS — R12 Heartburn: Secondary | ICD-10-CM | POA: Diagnosis not present

## 2023-04-06 DIAGNOSIS — Z23 Encounter for immunization: Secondary | ICD-10-CM

## 2023-04-06 LAB — RAD ONC ARIA SESSION SUMMARY
Course Elapsed Days: 15
Plan Fractions Treated to Date: 10
Plan Prescribed Dose Per Fraction: 2 Gy
Plan Total Fractions Prescribed: 33
Plan Total Prescribed Dose: 66 Gy
Reference Point Dosage Given to Date: 20 Gy
Reference Point Session Dosage Given: 2 Gy
Session Number: 10

## 2023-04-06 MED ORDER — FILGRASTIM-SNDZ 480 MCG/0.8ML IJ SOSY
480.0000 ug | PREFILLED_SYRINGE | Freq: Every day | INTRAMUSCULAR | Status: DC
  Administered 2023-04-06: 480 ug via SUBCUTANEOUS

## 2023-04-07 ENCOUNTER — Other Ambulatory Visit: Payer: Self-pay

## 2023-04-07 ENCOUNTER — Encounter: Payer: Self-pay | Admitting: Oncology

## 2023-04-07 ENCOUNTER — Ambulatory Visit
Admission: RE | Admit: 2023-04-07 | Discharge: 2023-04-07 | Disposition: A | Discharge status: Home / Self Care | Payer: Medicare HMO | Source: Ambulatory Visit | Attending: Radiation Oncology | Admitting: Radiation Oncology

## 2023-04-07 ENCOUNTER — Other Ambulatory Visit: Payer: Self-pay | Admitting: Family Medicine

## 2023-04-07 DIAGNOSIS — Z853 Personal history of malignant neoplasm of breast: Secondary | ICD-10-CM | POA: Diagnosis not present

## 2023-04-07 DIAGNOSIS — K208 Other esophagitis without bleeding: Secondary | ICD-10-CM | POA: Diagnosis not present

## 2023-04-07 DIAGNOSIS — C3412 Malignant neoplasm of upper lobe, left bronchus or lung: Secondary | ICD-10-CM | POA: Diagnosis not present

## 2023-04-07 DIAGNOSIS — B379 Candidiasis, unspecified: Secondary | ICD-10-CM | POA: Diagnosis not present

## 2023-04-07 DIAGNOSIS — Z5111 Encounter for antineoplastic chemotherapy: Secondary | ICD-10-CM | POA: Diagnosis not present

## 2023-04-07 DIAGNOSIS — Z510 Encounter for antineoplastic radiation therapy: Secondary | ICD-10-CM | POA: Diagnosis not present

## 2023-04-07 DIAGNOSIS — L270 Generalized skin eruption due to drugs and medicaments taken internally: Secondary | ICD-10-CM | POA: Diagnosis not present

## 2023-04-07 DIAGNOSIS — Z87891 Personal history of nicotine dependence: Secondary | ICD-10-CM | POA: Diagnosis not present

## 2023-04-07 DIAGNOSIS — R12 Heartburn: Secondary | ICD-10-CM | POA: Diagnosis not present

## 2023-04-07 DIAGNOSIS — C50511 Malignant neoplasm of lower-outer quadrant of right female breast: Secondary | ICD-10-CM | POA: Diagnosis not present

## 2023-04-07 LAB — RAD ONC ARIA SESSION SUMMARY
Course Elapsed Days: 16
Plan Fractions Treated to Date: 11
Plan Prescribed Dose Per Fraction: 2 Gy
Plan Total Fractions Prescribed: 33
Plan Total Prescribed Dose: 66 Gy
Reference Point Dosage Given to Date: 22 Gy
Reference Point Session Dosage Given: 2 Gy
Session Number: 11

## 2023-04-10 ENCOUNTER — Ambulatory Visit
Admission: RE | Admit: 2023-04-10 | Discharge: 2023-04-10 | Disposition: A | Discharge status: Home / Self Care | Payer: Medicare HMO | Source: Ambulatory Visit | Attending: Radiation Oncology | Admitting: Radiation Oncology

## 2023-04-10 ENCOUNTER — Ambulatory Visit: Payer: Medicare HMO

## 2023-04-10 ENCOUNTER — Ambulatory Visit: Payer: Medicare HMO | Admitting: Urology

## 2023-04-10 ENCOUNTER — Other Ambulatory Visit: Payer: Self-pay

## 2023-04-10 ENCOUNTER — Inpatient Hospital Stay: Payer: Medicare HMO

## 2023-04-10 VITALS — BP 124/84 | HR 108 | Temp 96.0°F | Resp 17 | Wt 115.3 lb

## 2023-04-10 DIAGNOSIS — C3492 Malignant neoplasm of unspecified part of left bronchus or lung: Secondary | ICD-10-CM

## 2023-04-10 DIAGNOSIS — Z87891 Personal history of nicotine dependence: Secondary | ICD-10-CM | POA: Diagnosis not present

## 2023-04-10 DIAGNOSIS — Z510 Encounter for antineoplastic radiation therapy: Secondary | ICD-10-CM | POA: Diagnosis not present

## 2023-04-10 DIAGNOSIS — K208 Other esophagitis without bleeding: Secondary | ICD-10-CM | POA: Diagnosis not present

## 2023-04-10 DIAGNOSIS — L270 Generalized skin eruption due to drugs and medicaments taken internally: Secondary | ICD-10-CM | POA: Diagnosis not present

## 2023-04-10 DIAGNOSIS — R12 Heartburn: Secondary | ICD-10-CM | POA: Diagnosis not present

## 2023-04-10 DIAGNOSIS — Z5111 Encounter for antineoplastic chemotherapy: Secondary | ICD-10-CM | POA: Diagnosis not present

## 2023-04-10 DIAGNOSIS — C3412 Malignant neoplasm of upper lobe, left bronchus or lung: Secondary | ICD-10-CM | POA: Diagnosis not present

## 2023-04-10 DIAGNOSIS — B379 Candidiasis, unspecified: Secondary | ICD-10-CM | POA: Diagnosis not present

## 2023-04-10 DIAGNOSIS — C50511 Malignant neoplasm of lower-outer quadrant of right female breast: Secondary | ICD-10-CM | POA: Diagnosis not present

## 2023-04-10 DIAGNOSIS — Z853 Personal history of malignant neoplasm of breast: Secondary | ICD-10-CM | POA: Diagnosis not present

## 2023-04-10 LAB — CMP (CANCER CENTER ONLY)
ALT: 19 U/L (ref 0–44)
AST: 22 U/L (ref 15–41)
Albumin: 3.8 g/dL (ref 3.5–5.0)
Alkaline Phosphatase: 83 U/L (ref 38–126)
Anion gap: 12 (ref 5–15)
BUN: 22 mg/dL (ref 8–23)
CO2: 24 mmol/L (ref 22–32)
Calcium: 9 mg/dL (ref 8.9–10.3)
Chloride: 99 mmol/L (ref 98–111)
Creatinine: 0.82 mg/dL (ref 0.44–1.00)
GFR, Estimated: >60 mL/min (ref >60)
Glucose, Bld: 122 mg/dL — ABNORMAL HIGH (ref 70–99)
Potassium: 3.6 mmol/L (ref 3.5–5.1)
Sodium: 135 mmol/L (ref 135–145)
Total Bilirubin: 1 mg/dL (ref <1.2)
Total Protein: 6.6 g/dL (ref 6.5–8.1)

## 2023-04-10 LAB — CBC WITH DIFFERENTIAL (CANCER CENTER ONLY)
Abs Immature Granulocytes: 0.31 10*3/uL — ABNORMAL HIGH (ref 0.00–0.07)
Basophils Absolute: 0 10*3/uL (ref 0.0–0.1)
Basophils Relative: 0 %
Eosinophils Absolute: 0 10*3/uL (ref 0.0–0.5)
Eosinophils Relative: 0 %
HCT: 43.8 % (ref 36.0–46.0)
Hemoglobin: 14.6 g/dL (ref 12.0–15.0)
Immature Granulocytes: 4 %
Lymphocytes Relative: 9 %
Lymphs Abs: 0.7 10*3/uL (ref 0.7–4.0)
MCH: 31.6 pg (ref 26.0–34.0)
MCHC: 33.3 g/dL (ref 30.0–36.0)
MCV: 94.8 fL (ref 80.0–100.0)
Monocytes Absolute: 0.7 10*3/uL (ref 0.1–1.0)
Monocytes Relative: 9 %
Neutro Abs: 6.5 10*3/uL (ref 1.7–7.7)
Neutrophils Relative %: 78 %
Platelet Count: 183 10*3/uL (ref 150–400)
RBC: 4.62 MIL/uL (ref 3.87–5.11)
RDW: 12 % (ref 11.5–15.5)
Smear Review: NORMAL
WBC Count: 8.3 10*3/uL (ref 4.0–10.5)
nRBC: 0 % (ref 0.0–0.2)

## 2023-04-10 LAB — RAD ONC ARIA SESSION SUMMARY
Course Elapsed Days: 19
Plan Fractions Treated to Date: 12
Plan Prescribed Dose Per Fraction: 2 Gy
Plan Total Fractions Prescribed: 33
Plan Total Prescribed Dose: 66 Gy
Reference Point Dosage Given to Date: 24 Gy
Reference Point Session Dosage Given: 2 Gy
Session Number: 12

## 2023-04-10 MED ORDER — DEXAMETHASONE SODIUM PHOSPHATE 10 MG/ML IJ SOLN
10.0000 mg | Freq: Once | INTRAMUSCULAR | Status: AC
  Administered 2023-04-10: 10 mg via INTRAVENOUS
  Filled 2023-04-10: qty 1

## 2023-04-10 MED ORDER — SODIUM CHLORIDE 0.9 % IV SOLN
50.0000 mg/m2 | Freq: Once | INTRAVENOUS | Status: AC
  Administered 2023-04-10: 78 mg via INTRAVENOUS
  Filled 2023-04-10: qty 13

## 2023-04-10 MED ORDER — FAMOTIDINE IN NACL 20-0.9 MG/50ML-% IV SOLN
20.0000 mg | Freq: Once | INTRAVENOUS | Status: AC
  Administered 2023-04-10: 20 mg via INTRAVENOUS
  Filled 2023-04-10: qty 50

## 2023-04-10 MED ORDER — HEPARIN SOD (PORK) LOCK FLUSH 100 UNIT/ML IV SOLN
500.0000 [IU] | Freq: Once | INTRAVENOUS | Status: AC | PRN
  Administered 2023-04-10: 500 [IU]
  Filled 2023-04-10: qty 5

## 2023-04-10 MED ORDER — PALONOSETRON HCL INJECTION 0.25 MG/5ML
0.2500 mg | Freq: Once | INTRAVENOUS | Status: AC
  Administered 2023-04-10: 0.25 mg via INTRAVENOUS
  Filled 2023-04-10: qty 5

## 2023-04-10 MED ORDER — SODIUM CHLORIDE 0.9 % IV SOLN
146.0000 mg | Freq: Once | INTRAVENOUS | Status: AC
  Administered 2023-04-10: 150 mg via INTRAVENOUS
  Filled 2023-04-10: qty 15

## 2023-04-10 MED ORDER — SODIUM CHLORIDE 0.9 % IV SOLN
INTRAVENOUS | Status: DC
Start: 2023-04-10 — End: 2023-04-10
  Filled 2023-04-10: qty 250

## 2023-04-10 MED ORDER — CETIRIZINE HCL 10 MG/ML IV SOLN
10.0000 mg | Freq: Once | INTRAVENOUS | Status: AC
  Administered 2023-04-10: 10 mg via INTRAVENOUS
  Filled 2023-04-10: qty 1

## 2023-04-11 ENCOUNTER — Ambulatory Visit: Payer: Medicare HMO

## 2023-04-12 ENCOUNTER — Inpatient Hospital Stay: Payer: Medicare HMO

## 2023-04-12 ENCOUNTER — Other Ambulatory Visit: Payer: Self-pay

## 2023-04-12 ENCOUNTER — Encounter: Payer: Self-pay | Admitting: Hospice and Palliative Medicine

## 2023-04-12 ENCOUNTER — Other Ambulatory Visit: Payer: Self-pay | Admitting: *Deleted

## 2023-04-12 ENCOUNTER — Inpatient Hospital Stay (HOSPITAL_BASED_OUTPATIENT_CLINIC_OR_DEPARTMENT_OTHER): Payer: Medicare HMO | Admitting: Hospice and Palliative Medicine

## 2023-04-12 ENCOUNTER — Ambulatory Visit
Admission: RE | Admit: 2023-04-12 | Discharge: 2023-04-12 | Disposition: A | Discharge status: Home / Self Care | Payer: Medicare HMO | Source: Ambulatory Visit | Attending: Radiation Oncology | Admitting: Radiation Oncology

## 2023-04-12 ENCOUNTER — Telehealth: Payer: Self-pay | Admitting: *Deleted

## 2023-04-12 VITALS — BP 110/78 | HR 101 | Temp 98.9°F | Resp 16 | Ht 61.0 in | Wt 111.0 lb

## 2023-04-12 DIAGNOSIS — E860 Dehydration: Secondary | ICD-10-CM

## 2023-04-12 DIAGNOSIS — Z853 Personal history of malignant neoplasm of breast: Secondary | ICD-10-CM | POA: Diagnosis not present

## 2023-04-12 DIAGNOSIS — C3492 Malignant neoplasm of unspecified part of left bronchus or lung: Secondary | ICD-10-CM

## 2023-04-12 DIAGNOSIS — Z87891 Personal history of nicotine dependence: Secondary | ICD-10-CM | POA: Diagnosis not present

## 2023-04-12 DIAGNOSIS — Z5111 Encounter for antineoplastic chemotherapy: Secondary | ICD-10-CM | POA: Diagnosis not present

## 2023-04-12 DIAGNOSIS — B379 Candidiasis, unspecified: Secondary | ICD-10-CM | POA: Diagnosis not present

## 2023-04-12 DIAGNOSIS — Z510 Encounter for antineoplastic radiation therapy: Secondary | ICD-10-CM | POA: Diagnosis not present

## 2023-04-12 DIAGNOSIS — G893 Neoplasm related pain (acute) (chronic): Secondary | ICD-10-CM

## 2023-04-12 DIAGNOSIS — C50511 Malignant neoplasm of lower-outer quadrant of right female breast: Secondary | ICD-10-CM | POA: Diagnosis not present

## 2023-04-12 DIAGNOSIS — C3412 Malignant neoplasm of upper lobe, left bronchus or lung: Secondary | ICD-10-CM | POA: Diagnosis not present

## 2023-04-12 DIAGNOSIS — Z95828 Presence of other vascular implants and grafts: Secondary | ICD-10-CM

## 2023-04-12 DIAGNOSIS — L270 Generalized skin eruption due to drugs and medicaments taken internally: Secondary | ICD-10-CM | POA: Diagnosis not present

## 2023-04-12 DIAGNOSIS — R12 Heartburn: Secondary | ICD-10-CM | POA: Diagnosis not present

## 2023-04-12 DIAGNOSIS — K208 Other esophagitis without bleeding: Secondary | ICD-10-CM | POA: Diagnosis not present

## 2023-04-12 LAB — RAD ONC ARIA SESSION SUMMARY
Course Elapsed Days: 21
Plan Fractions Treated to Date: 13
Plan Prescribed Dose Per Fraction: 2 Gy
Plan Total Fractions Prescribed: 33
Plan Total Prescribed Dose: 66 Gy
Reference Point Dosage Given to Date: 26 Gy
Reference Point Session Dosage Given: 2 Gy
Session Number: 13

## 2023-04-12 LAB — CBC WITH DIFFERENTIAL (CANCER CENTER ONLY)
Abs Immature Granulocytes: 0.08 10*3/uL — ABNORMAL HIGH (ref 0.00–0.07)
Basophils Absolute: 0.1 10*3/uL (ref 0.0–0.1)
Basophils Relative: 1 %
Eosinophils Absolute: 0 10*3/uL (ref 0.0–0.5)
Eosinophils Relative: 0 %
HCT: 39.7 % (ref 36.0–46.0)
Hemoglobin: 13.6 g/dL (ref 12.0–15.0)
Immature Granulocytes: 1 %
Lymphocytes Relative: 2 %
Lymphs Abs: 0.2 10*3/uL — ABNORMAL LOW (ref 0.7–4.0)
MCH: 31.9 pg (ref 26.0–34.0)
MCHC: 34.3 g/dL (ref 30.0–36.0)
MCV: 93.2 fL (ref 80.0–100.0)
Monocytes Absolute: 0.1 10*3/uL (ref 0.1–1.0)
Monocytes Relative: 2 %
Neutro Abs: 7.4 10*3/uL (ref 1.7–7.7)
Neutrophils Relative %: 94 %
Platelet Count: 140 10*3/uL — ABNORMAL LOW (ref 150–400)
RBC: 4.26 MIL/uL (ref 3.87–5.11)
RDW: 12 % (ref 11.5–15.5)
WBC Count: 7.8 10*3/uL (ref 4.0–10.5)
nRBC: 0 % (ref 0.0–0.2)

## 2023-04-12 LAB — CMP (CANCER CENTER ONLY)
ALT: 21 U/L (ref 0–44)
AST: 23 U/L (ref 15–41)
Albumin: 3.9 g/dL (ref 3.5–5.0)
Alkaline Phosphatase: 69 U/L (ref 38–126)
Anion gap: 10 (ref 5–15)
BUN: 21 mg/dL (ref 8–23)
CO2: 24 mmol/L (ref 22–32)
Calcium: 9.1 mg/dL (ref 8.9–10.3)
Chloride: 101 mmol/L (ref 98–111)
Creatinine: 0.68 mg/dL (ref 0.44–1.00)
GFR, Estimated: >60 mL/min (ref >60)
Glucose, Bld: 119 mg/dL — ABNORMAL HIGH (ref 70–99)
Potassium: 3.7 mmol/L (ref 3.5–5.1)
Sodium: 135 mmol/L (ref 135–145)
Total Bilirubin: 1 mg/dL (ref <1.2)
Total Protein: 6.4 g/dL — ABNORMAL LOW (ref 6.5–8.1)

## 2023-04-12 MED ORDER — OXYCODONE HCL 5 MG/5ML PO SOLN
5.0000 mg | ORAL | 0 refills | Status: DC | PRN
Start: 2023-04-12 — End: 2023-05-02

## 2023-04-12 MED ORDER — MAGIC MOUTHWASH W/LIDOCAINE: 5.0000 mL | Freq: Three times a day (TID) | ORAL | 0 refills | Status: DC | PRN

## 2023-04-12 MED ORDER — FLUCONAZOLE 100 MG PO TABS: 100.0000 mg | ORAL_TABLET | Freq: Every day | ORAL | 1 refills | Status: DC

## 2023-04-12 MED ORDER — HEPARIN SOD (PORK) LOCK FLUSH 100 UNIT/ML IV SOLN
500.0000 [IU] | Freq: Once | INTRAVENOUS | Status: AC
Start: 2023-04-12 — End: 2023-04-12
  Administered 2023-04-12: 500 [IU]
  Filled 2023-04-12: qty 5

## 2023-04-12 MED ORDER — FLUCONAZOLE 10 MG/ML PO SUSR: 100.0000 mg | Freq: Every day | ORAL | 0 refills | Status: DC

## 2023-04-12 MED ORDER — ONDANSETRON 4 MG PO TBDP: 4.0000 mg | ORAL_TABLET | Freq: Three times a day (TID) | ORAL | 0 refills | Status: DC | PRN

## 2023-04-12 MED ORDER — SODIUM CHLORIDE 0.9 % IV SOLN
INTRAVENOUS | Status: DC
  Filled 2023-04-12 (×2): qty 250

## 2023-04-12 MED ORDER — OMEPRAZOLE 2 MG/ML ORAL SUSPENSION: 40.0000 mg | Freq: Every day | ORAL | 1 refills | Status: AC

## 2023-04-12 MED ORDER — SUCRALFATE 1 GM/10ML PO SUSP: 1.0000 g | Freq: Three times a day (TID) | ORAL | 0 refills | Status: DC

## 2023-04-12 MED ORDER — MORPHINE SULFATE (PF) 2 MG/ML IV SOLN
1.0000 mg | Freq: Once | INTRAVENOUS | Status: AC
  Administered 2023-04-12: 1 mg via INTRAVENOUS
  Filled 2023-04-12: qty 1

## 2023-04-12 NOTE — Telephone Encounter (Signed)
Omeprazole Susp not available and is asking if patient could just open a capsule which would be cheaper than compounding it. Please return her call to discuss

## 2023-04-12 NOTE — Progress Notes (Signed)
Symptom Management Clinic [facility] at [facility] [phone] [phone]  Patient Care Team: Jacky Kindle, FNP as PCP - General (Family Medicine) Debbe Odea, MD as PCP - Cardiology (Cardiology) Creig Hines, MD as Consulting Physician (Oncology) Leafy Ro, MD as Consulting Physician (General Surgery) Dasher, Cliffton Asters, MD (Dermatology) Scheeler, Kermit Balo, PA-C as Physician Assistant (Plastic Surgery) Minna Antis, MD as Attending Physician (Emergency Medicine) Alvira Philips, Leitha Bleak (Neurology) Dayna Barker, MD as Referring Physician (Internal Medicine) Coralyn Helling, MD (Inactive) as Consulting Physician (Pulmonary Disease) Gaspar Cola, RPH (Inactive) (Pharmacist) Pa, [facility] (Optometry) Glory Buff, RN as Oncology Nurse Navigator Carmina Miller, MD as Consulting Physician (Radiation Oncology)   NAME OF PATIENT: Denise Watts  [id number]  [birth year]   DATE OF VISIT: 04/12/23  REASON FOR CONSULT: SIRINITY PETERMANN is a 67 y.o. 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   INTERVAL HISTORY: Patient received cycle 4 weekly carbo Taxol chemotherapy on 04/10/2023.  She presented to the cancer center for XRT on 04/12/2023 and was overall not feeling well with poor oral intake.  Patient was referred to [facility] for supportive care.  Patient reports several days of burning pain in her esophagus when she tries to eat or drink.  Has nausea but no vomiting.  Denies fever or chills.  Endorses constipation.  Appetite and oral intake have been poor for weeks.  Denies chest pain.  Does have chronic cough but patient says this is unchanged in characteristic or severity.  Denies shortness of breath or  wheezing.  Denies any neurologic complaints. Denies recent fevers or illnesses. Denies any easy bleeding or bruising. Denies urinary complaints. Patient offers no further specific complaints today.   PAST MEDICAL HISTORY: Past Medical History:  Diagnosis Date   Adenomatous colon polyp    Adrenal carcinoma, right (HCC) 12/28/2016   a.) high grade carcinoma with extensive necrosis --> s/p adrenalectomy   Anxiety    Aortic atherosclerosis (HCC)    Arthritis of right knee    Atrial fibrillation and flutter (HCC)    a.) CHA2DS2-VASc = 3 (age, sex, vascular disease history) as of 02/13/2023; b.) cardiac rate/rhythm maintained intrinsically without pharmacological intervention; no chronic OAC (does take low dose ASA)   B12 deficiency    Breast cancer (HCC) 1997   a.) stage IB (cT1a, cN0, cM0, G3, ER+, PR-, HER2-); s/p lumpectomy + XRT   COPD (chronic obstructive pulmonary disease) (HCC)    Coronary artery disease    a.) cCTA 12/09/2021: Ca2+ = 135 (80th %'ile for age/sex/race match contol) --> 25-49% pLAD and RCA distributions   Depression    Diastolic dysfunction    a.) TTE 12/11/2020, no RWMAs, norm RVSF, G1DD; b.) TTE 12/14/2021: EF 55-60, no RWMAs, norm RVSF, G1DD; c.) TTE 01/27/2023: EF 50-55%, no RWMAs, G1DD, mild RAE, mild MR, mod TR   Dry cough 03/02/2017   Dyspnea    Full dentures    GERD (gastroesophageal reflux disease)    History of hiatal hernia    HLD (hyperlipidemia)    Invasive carcinoma of breast (HCC) 11/15/2017   a.) path (+) for G3, ER -, PR -, HER2/neu - ; s/p lumpectomy 01/17/2018 + 4 cycles TC systemic chemotherapy + XRT   Long term current use of aspirin  Personal history of chemotherapy    Personal history of radiation therapy    Pneumothorax of left lung after biopsy 01/25/2023   a.) hypoxic in PACU requiring re-intubation --> transferred to ICU --> chest tube placed by PCCM   RLS (restless legs syndrome)    a.) on ropinirole   Skin cancer    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   a.) Stage IB (cT1a, cN0, cM0, G3, ER+, PR-, HER2-)    PAST SURGICAL HISTORY:  Past Surgical History:  Procedure Laterality Date   BIOPSY  01/05/2023   Procedure: BIOPSY;  Surgeon: Toney Reil, MD;  Location: [facility];  Service: Gastroenterology;;   BREAST BIOPSY Right 1997   positive   BREAST BIOPSY Right 11/15/2017   INVASIVE MAMMARY CARCINOMA triple negative   BREAST BIOPSY Right 07/20/2020   [place] bx, Q marker, neg   BREAST LUMPECTOMY Right 1997   2019 also   BREAST LUMPECTOMY WITH SENTINEL LYMPH NODE BIOPSY Right 12/08/2017   Procedure: BREAST LUMPECTOMY WITH SENTINEL LYMPH NODE BX;  Surgeon: Earline Mayotte, MD;  Location: [facility];  Service: General;  Laterality: Right;   BREAST REDUCTION WITH MASTOPEXY Left 06/10/2019   Procedure: Left breast mastopexy reduction for symmetry;  Surgeon: Peggye Form, DO;  Location: [facility];  Service: Plastics;  Laterality: Left;  total 2.5 hours   CATARACT EXTRACTION W/PHACO Right 02/07/2022   Procedure: CATARACT EXTRACTION PHACO AND INTRAOCULAR LENS PLACEMENT (IOC) RIGHT DIABETIC;  Surgeon: Nevada Crane, MD;  Location: [facility];  Service: Ophthalmology;  Laterality: Right;  3.60 00:34.7   CATARACT EXTRACTION W/PHACO Left 02/28/2022   Procedure: CATARACT EXTRACTION PHACO AND INTRAOCULAR LENS PLACEMENT (IOC) LEFT DIABETIC 4.11 00:26.1;  Surgeon: Nevada Crane, MD;  Location: [facility];  Service: Ophthalmology;  Laterality: Left;   COLONOSCOPY  2015   COLONOSCOPY N/A 01/05/2023   Procedure: COLONOSCOPY;  Surgeon: Toney Reil, MD;  Location: [facility];  Service: Gastroenterology;  Laterality: N/A;   COLONOSCOPY WITH PROPOFOL N/A 10/29/2019   Procedure: COLONOSCOPY WITH PROPOFOL;  Surgeon: Midge Minium, MD;  Location: [facility] END[facility];  Service: Endoscopy;  Laterality: N/A;   ESOPHAGOGASTRODUODENOSCOPY (EGD) WITH PROPOFOL N/A  05/24/2019   Procedure: ESOPHAGOGASTRODUODENOSCOPY (EGD) WITH PROPOFOL;  Surgeon: Midge Minium, MD;  Location: [facility] END[facility]COPY;  Service: Endoscopy;  Laterality: N/A;   ESOPHAGOGASTRODUODENOSCOPY (EGD) WITH PROPOFOL N/A 09/06/2022   Procedure: ESOPHAGOGASTRODUODENOSCOPY (EGD) WITH PROPOFOL;  Surgeon: Midge Minium, MD;  Location: [facility];  Service: Endoscopy;  Laterality: N/A;   ESOPHAGOGASTRODUODENOSCOPY (EGD) WITH PROPOFOL  01/05/2023   Procedure: ESOPHAGOGASTRODUODENOSCOPY (EGD) WITH PROPOFOL;  Surgeon: Toney Reil, MD;  Location: [facility];  Service: Gastroenterology;;   ESOPHAGUS SURGERY     IR IMAGING GUIDED PORT INSERTION  02/07/2023   KNEE SURGERY Right    LIPOSUCTION WITH LIPOFILLING Right 06/10/2019   Procedure: right breast scar and capsule release with fat grafting;  Surgeon: Peggye Form, DO;  Location: [facility];  Service: Plastics;  Laterality: Right;   POLYPECTOMY  01/05/2023   Procedure: POLYPECTOMY;  Surgeon: Toney Reil, MD;  Location: [facility] ENDOSCOPY;  Service: Gastroenterology;;   [facility] REMOVAL     PORTACATH PLACEMENT Right 01/17/2018   Procedure: INSERTION PORT-A-CATH- RIGHT;  Surgeon: Earline Mayotte, MD;  Location: [facility];  Service: General;  Laterality: Right;   ROBOTIC ADRENALECTOMY Right 12/28/2016   Procedure: ROBOTIC ADRENALECTOMY;  Surgeon: Vanna Scotland, MD;  Location: [facility];  Service: Urology;  Laterality: Right;   SKIN CANCER EXCISION     THROAT SURGERY  1998   throat cancer    TUBAL LIGATION     VENTRAL HERNIA REPAIR N/A 03/03/2017   Procedure: HERNIA REPAIR VENTRAL ADULT;  Surgeon: Ricarda Frame, MD;  Location: [facility];  Service: General;  Laterality: N/A;   VIDEO BRONCHOSCOPY WITH ENDOBRONCHIAL ULTRASOUND N/A 01/25/2023   Procedure: VIDEO BRONCHOSCOPY WITH ENDOBRONCHIAL ULTRASOUND;  Surgeon: Vida Rigger, MD;  Location: [facility];  Service: Thoracic;  Laterality: N/A;   VIDEO BRONCHOSCOPY WITH  ENDOBRONCHIAL ULTRASOUND N/A 02/17/2023   Procedure: VIDEO BRONCHOSCOPY WITH ENDOBRONCHIAL ULTRASOUND;  Surgeon: Vida Rigger, MD;  Location: [facility];  Service: Thoracic;  Laterality: N/A;    HEMATOLOGY/ONCOLOGY HISTORY:  Oncology History Overview Note  1. Carcinoma of breast, T1, N0, M0 tumor diagnosis.  In January of 1997 2. Carcinoma of the vocal cord T1, N0, M0 tumor.  Status postradiation therapy 3. Abnormal CT scan of the chest (December, 2015) 4. Repeat CT scan of chest shows stable nodule (July, 2016) 5. Neutropenia with normal hemoglobin and normal platelet count (July, 2016)   Cancer of vocal cord [facility]) (Resolved)  11/10/2014 Initial Diagnosis   Cancer of vocal cord   Malignant neoplasm of lower-outer quadrant of right breast of female, estrogen receptor negative (HCC)  11/24/2017 Initial Diagnosis   Malignant neoplasm of lower-outer quadrant of right breast of female, estrogen receptor negative (HCC)   01/12/2018 Cancer Staging   Staging form: Breast, AJCC 8th Edition - Clinical stage from 01/12/2018: Stage IB (cT1a, cN0, cM0, G3, ER+, PR-, HER2-) - Signed by Creig Hines, MD on 01/12/2018   01/30/2018 - 04/26/2018 Chemotherapy   The patient had dexamethasone (DECADRON) 4 MG tablet, 8 mg, Oral, 2 times daily, 1 of 1 cycle, Start date: 01/12/2018, End date: 08/03/2018 palonosetron (ALOXI) injection 0.25 mg, 0.25 mg, Intravenous,  Once, 4 of 4 cycles Administration: 0.25 mg (01/30/2018), 0.25 mg (02/27/2018), 0.25 mg (03/28/2018), 0.25 mg (04/26/2018) pegfilgrastim (NEULASTA ONPRO KIT) injection 6 mg, 6 mg, Subcutaneous, Once, 4 of 4 cycles Administration: 6 mg (01/30/2018), 6 mg (02/27/2018), 6 mg (03/28/2018), 6 mg (04/26/2018) cyclophosphamide (CYTOXAN) 1,000 mg in sodium chloride 0.9 % 250 mL chemo infusion, 600 mg/m2 = 1,000 mg, Intravenous,  Once, 4 of 4 cycles Administration: 1,000 mg (01/30/2018), 1,000 mg (02/27/2018), 1,000 mg (03/28/2018), 1,000 mg (04/26/2018) DOCEtaxel  (TAXOTERE) 130 mg in sodium chloride 0.9 % 250 mL chemo infusion, 75 mg/m2 = 130 mg, Intravenous,  Once, 4 of 4 cycles Dose modification: 60 mg/m2 (original dose 75 mg/m2, Cycle 2, Reason: Dose not tolerated) Administration: 130 mg (01/30/2018), 100 mg (02/27/2018), 100 mg (03/28/2018), 100 mg (04/26/2018)  for chemotherapy treatment.    Non-small cell lung cancer (HCC)  03/09/2023 Initial Diagnosis   Non-small cell lung cancer (HCC)   03/20/2023 -  Chemotherapy   Patient is on Treatment Plan : LUNG Carboplatin + Paclitaxel q7d       ALLERGIES:  is allergic to sulfa antibiotics.  MEDICATIONS:  Current Outpatient Medications  Medication Sig Dispense Refill   aspirin EC 81 MG tablet Take 1 tablet (81 mg total) by mouth daily. Swallow whole. 90 tablet 3   benzonatate (TESSALON) 100 MG capsule Take 2 capsules (200 mg total) by mouth 2 (two) times daily as needed for cough. 180 capsule 0   buPROPion (WELLBUTRIN XL) 150 MG 24 hr tablet Take 3 tablets (450 mg total) by mouth daily. 90 tablet 1   busPIRone (BUSPAR) 5 MG tablet  Take 1 tablet (5 mg total) by mouth 3 (three) times daily. 270 tablet 0   ciprofloxacin (CIPRO) 250 MG tablet Take 1 tablet (250 mg total) by mouth 2 (two) times daily. 14 tablet 0   cyclobenzaprine (FLEXERIL) 5 MG tablet TAKE 1 TABLET BY MOUTH AT BEDTIME 30 tablet 0   dexamethasone (DECADRON) 4 MG tablet Take 2 tablets daily for 2 days, start the day after chemotherapy. Take with food. 30 tablet 1   DULoxetine (CYMBALTA) 60 MG capsule Take 1 capsule (60 mg total) by mouth 2 (two) times daily. 180 capsule 0   fluconazole (DIFLUCAN) 100 MG tablet Take 1 tablet (100 mg total) by mouth daily for 10 days. 10 tablet 1   lidocaine (LIDODERM) 5 % Place 1 patch onto the skin daily. Remove & Discard patch within 12 hours or as directed by MD 30 patch 0   lidocaine-prilocaine (EMLA) cream Apply to affected area once 30 g 3   meloxicam (MOBIC) 7.5 MG tablet TAKE 1 TABLET BY MOUTH  DAILY 30 tablet 0   montelukast (SINGULAIR) 10 MG tablet Take 1 tablet (10 mg total) by mouth at bedtime. 90 tablet 3   omeprazole (PRILOSEC) 20 MG capsule Take 1 capsule (20 mg total) by mouth 2 (two) times daily before a meal. 60 capsule 11   ondansetron (ZOFRAN) 8 MG tablet Take 1 tablet (8 mg total) by mouth every 8 (eight) hours as needed for nausea or vomiting. Start on the third day after chemotherapy. 30 tablet 1   oxyCODONE (ROXICODONE) 5 MG immediate release tablet Take 1 tablet (5 mg total) by mouth every 8 (eight) hours as needed for severe pain or moderate pain. 60 tablet 0   predniSONE (DELTASONE) 50 MG tablet Take 1 tablet (50 mg total) by mouth daily with breakfast. 3 tablet 0   prochlorperazine (COMPAZINE) 10 MG tablet Take 1 tablet (10 mg total) by mouth every 6 (six) hours as needed for nausea or vomiting. 30 tablet 1   rOPINIRole (REQUIP) 0.25 MG tablet TAKE 1 TABLET BY MOUTH AT BEDTIME 90 tablet 1   rosuvastatin (CRESTOR) 40 MG tablet TAKE 1 TABLET BY MOUTH DAILY 90 tablet 1   sucralfate (CARAFATE) 1 g tablet Take 1 tablet (1 g total) by mouth 3 (three) times daily before meals. 90 tablet 4   tolterodine (DETROL LA) 4 MG 24 hr capsule Take 1 capsule (4 mg total) by mouth daily. 30 capsule 11   No current facility-administered medications for this visit.   Facility-Administered Medications Ordered in Other Visits  Medication Dose Route Frequency Provider Last Rate Last Admin   cyanocobalamin ((VITAMIN B-12)) injection 1,000 mcg  1,000 mcg Intramuscular Q30 days Creig Hines, MD   1,000 mcg at 08/27/21 1537   cyanocobalamin (VITAMIN B12) injection 1,000 mcg  1,000 mcg Intramuscular Q30 days Creig Hines, MD   1,000 mcg at 03/16/22 1101    VITAL SIGNS: There were no vitals taken for this visit. There were no vitals filed for this visit.  Estimated body mass index is 21.79 kg/m as calculated from the following:   Height as of 03/10/23: 5\' 1"  (1.549 m).   Weight as of  04/10/23: 115 lb 4.8 oz (52.3 kg).  LABS: CBC:    Component Value Date/Time   WBC 8.3 04/10/2023 1150   WBC 7.5 01/28/2023 0407   HGB 14.6 04/10/2023 1150   HGB 14.0 11/03/2021 1551   HCT 43.8 04/10/2023 1150   HCT 42.1 11/03/2021 1551  PLT 183 04/10/2023 1150   PLT 216 11/03/2021 1551   MCV 94.8 04/10/2023 1150   MCV 97 11/03/2021 1551   MCV 100 04/08/2014 0409   NEUTROABS 6.5 04/10/2023 1150   NEUTROABS 3.5 11/03/2021 1551   NEUTROABS 6.1 04/08/2014 0409   LYMPHSABS 0.7 04/10/2023 1150   LYMPHSABS 1.2 11/03/2021 1551   LYMPHSABS 0.8 (L) 04/08/2014 0409   MONOABS 0.7 04/10/2023 1150   MONOABS 0.7 04/08/2014 0409   EOSABS 0.0 04/10/2023 1150   EOSABS 0.2 11/03/2021 1551   EOSABS 0.0 04/08/2014 0409   BASOSABS 0.0 04/10/2023 1150   BASOSABS 0.0 11/03/2021 1551   BASOSABS 0.0 04/08/2014 0409   Comprehensive Metabolic Panel:    Component Value Date/Time   NA 135 04/10/2023 1150   NA 145 (H) 11/03/2021 1551   NA 143 04/08/2014 0409   K 3.6 04/10/2023 1150   K 3.5 04/08/2014 0409   CL 99 04/10/2023 1150   CL 113 (H) 04/08/2014 0409   CO2 24 04/10/2023 1150   CO2 26 04/08/2014 0409   BUN 22 04/10/2023 1150   BUN 13 11/03/2021 1551   BUN 6 (L) 04/08/2014 0409   CREATININE 0.82 04/10/2023 1150   CREATININE 0.67 04/08/2014 0409   GLUCOSE 122 (H) 04/10/2023 1150   GLUCOSE 84 04/08/2014 0409   CALCIUM 9.0 04/10/2023 1150   CALCIUM 7.5 (L) 04/08/2014 0409   AST 22 04/10/2023 1150   ALT 19 04/10/2023 1150   ALT 21 11/26/2012 1019   ALKPHOS 83 04/10/2023 1150   ALKPHOS 87 11/26/2012 1019   BILITOT 1.0 04/10/2023 1150   PROT 6.6 04/10/2023 1150   PROT 6.3 11/03/2021 1551   PROT 6.4 11/26/2012 1019   ALBUMIN 3.8 04/10/2023 1150   ALBUMIN 4.1 11/03/2021 1551   ALBUMIN 3.5 11/26/2012 1019    RADIOGRAPHIC STUDIES: No results found.  PERFORMANCE STATUS (ECOG) : 1 - Symptomatic but completely ambulatory  Review of Systems Unless otherwise noted, a complete review  of systems is negative.  Physical Exam General: NAD Cardiovascular: regular rate and rhythm Pulmonary: clear ant fields Abdomen: soft, nontender, + bowel sounds GU: no suprapubic tenderness Extremities: no edema, no joint deformities Skin: no rashes Neurological: Weakness but otherwise nonfocal  IMPRESSION/PLAN: NSCLC - on weekly carbo taxol chemotherapy and concurrent XRT  Thrush -Dr. Rushie Chestnut started patient on fluconazole today.  However, patient says she is unable to swallow pills so we will switch to liquid fluconazole.  Patient may also be having component of radiation esophagitis.  Patient says that Dr. Rushie Chestnut plans to hold radiation treatments until symptoms are improved.  Will switch sucralfate and omeprazole also to oral suspension.  Poor oral intake - referral to nutrition. Discussed importance of high calorie/protein foods and recommended oral nutritional supplements TID.  Labs stable.  Will proceed with IV fluids today as patient having difficulty swallowing liquids.  Neoplasm related pain -switch oxycodone to liquid as patient having difficulty swallowing pills  Depressive symptoms - Referral to [facility]  Will bring patient back for labs/fluid clinic on Friday and then patient will see Dr. Smith Robert next week.  Case and plan discussed with Dr. Smith Robert   Patient expressed understanding and was in agreement with this plan. She also understands that She can call clinic at any time with any questions, concerns, or complaints.   Thank you for allowing me to participate in the care of this very pleasant patient.   Time Total: 25 minutes  Visit consisted of counseling and education dealing with  the complex and emotionally intense issues of symptom management in the setting of serious illness.Greater than 50%  of this time was spent counseling and coordinating care related to the above assessment and plan.  Signed by: Laurette Schimke, PhD, NP-C

## 2023-04-12 NOTE — Telephone Encounter (Signed)
Called pharmacy to use Omeprazole 40 mg cap and open capsule and sprinkle over applesauce once daily # 30 tab 1 refill

## 2023-04-13 ENCOUNTER — Ambulatory Visit: Payer: Medicare HMO

## 2023-04-14 ENCOUNTER — Inpatient Hospital Stay: Payer: Medicare HMO

## 2023-04-14 ENCOUNTER — Other Ambulatory Visit: Payer: Self-pay

## 2023-04-14 ENCOUNTER — Ambulatory Visit: Payer: Medicare HMO

## 2023-04-14 VITALS — BP 130/82 | HR 117 | Temp 97.7°F | Resp 18

## 2023-04-14 DIAGNOSIS — K208 Other esophagitis without bleeding: Secondary | ICD-10-CM | POA: Diagnosis not present

## 2023-04-14 DIAGNOSIS — C3492 Malignant neoplasm of unspecified part of left bronchus or lung: Secondary | ICD-10-CM

## 2023-04-14 DIAGNOSIS — L270 Generalized skin eruption due to drugs and medicaments taken internally: Secondary | ICD-10-CM | POA: Diagnosis not present

## 2023-04-14 DIAGNOSIS — Z510 Encounter for antineoplastic radiation therapy: Secondary | ICD-10-CM | POA: Diagnosis not present

## 2023-04-14 DIAGNOSIS — E876 Hypokalemia: Secondary | ICD-10-CM

## 2023-04-14 DIAGNOSIS — C50511 Malignant neoplasm of lower-outer quadrant of right female breast: Secondary | ICD-10-CM | POA: Diagnosis not present

## 2023-04-14 DIAGNOSIS — Z5111 Encounter for antineoplastic chemotherapy: Secondary | ICD-10-CM | POA: Diagnosis not present

## 2023-04-14 DIAGNOSIS — R12 Heartburn: Secondary | ICD-10-CM | POA: Diagnosis not present

## 2023-04-14 DIAGNOSIS — E860 Dehydration: Secondary | ICD-10-CM

## 2023-04-14 DIAGNOSIS — C3412 Malignant neoplasm of upper lobe, left bronchus or lung: Secondary | ICD-10-CM | POA: Diagnosis not present

## 2023-04-14 DIAGNOSIS — B379 Candidiasis, unspecified: Secondary | ICD-10-CM | POA: Diagnosis not present

## 2023-04-14 DIAGNOSIS — Z87891 Personal history of nicotine dependence: Secondary | ICD-10-CM | POA: Diagnosis not present

## 2023-04-14 LAB — BASIC METABOLIC PANEL - CANCER CENTER ONLY
Anion gap: 11 (ref 5–15)
BUN: 26 mg/dL — ABNORMAL HIGH (ref 8–23)
CO2: 24 mmol/L (ref 22–32)
Calcium: 8.8 mg/dL — ABNORMAL LOW (ref 8.9–10.3)
Chloride: 102 mmol/L (ref 98–111)
Creatinine: 0.88 mg/dL (ref 0.44–1.00)
GFR, Estimated: >60 mL/min (ref >60)
Glucose, Bld: 191 mg/dL — ABNORMAL HIGH (ref 70–99)
Potassium: 3.3 mmol/L — ABNORMAL LOW (ref 3.5–5.1)
Sodium: 137 mmol/L (ref 135–145)

## 2023-04-14 MED ORDER — POTASSIUM CHLORIDE 20 MEQ/100ML IV SOLN
20.0000 meq | Freq: Once | INTRAVENOUS | Status: AC
Start: 2023-04-14 — End: 2023-04-14
  Administered 2023-04-14: 20 meq via INTRAVENOUS

## 2023-04-14 MED ORDER — HEPARIN SOD (PORK) LOCK FLUSH 100 UNIT/ML IV SOLN
500.0000 [IU] | Freq: Once | INTRAVENOUS | Status: AC
Start: 2023-04-14 — End: 2023-04-14
  Administered 2023-04-14: 500 [IU] via INTRAVENOUS
  Filled 2023-04-14: qty 5

## 2023-04-14 MED ORDER — SODIUM CHLORIDE 0.9% FLUSH
10.0000 mL | Freq: Once | INTRAVENOUS | Status: AC
Start: 2023-04-14 — End: 2023-04-14
  Administered 2023-04-14: 10 mL via INTRAVENOUS
  Filled 2023-04-14: qty 10

## 2023-04-14 MED ORDER — SODIUM CHLORIDE 0.9 % IV SOLN
INTRAVENOUS | Status: DC
  Filled 2023-04-14 (×2): qty 250

## 2023-04-14 NOTE — Patient Instructions (Addendum)
Potassium Acetate Injection What is this medication? POTASSIUM ACETATE (poe TASS i um ASa tate) prevents and treats low levels of potassium in your body. Potassium plays an important role in maintaining the health of your kidneys, heart, muscles, and nervous system. This medicine may be used for other purposes; ask your health care provider or pharmacist if you have questions. What should I tell my care team before I take this medication? They need to know if you have any of these conditions: Addison's disease Dehydration Diabetes Heart disease High levels of potassium in the blood Irregular heartbeat Kidney disease Liver disease Recent severe burn An unusual or allergic reaction to potassium, other medications, foods, dyes, or preservatives Pregnant or trying to get pregnant Breast-feeding How should I use this medication? This medication is for infusion into a vein. It is given in a hospital or clinic setting. Talk to your care team about the use of this medication in children. Special care may be needed. Overdosage: If you think you have taken too much of this medicine contact a poison control center or emergency room at once. NOTE: This medicine is only for you. Do not share this medicine with others. What if I miss a dose? This does not apply. What may interact with this medication? Do not take this medication with any of the following: Certain diuretics such as spironolactone, triamterene Eplerenone Sodium polystyrene sulfonate This medication may also interact with the following: Certain medications for blood pressure or heart disease like lisinopril, losartan, quinapril, valsartan Medications that lower your chance of fighting infection such as cyclosporine, tacrolimus NSAIDs, medications for pain and inflammation, like ibuprofen or naproxen Other potassium supplements Salt substitutes This list may not describe all possible interactions. Give your health care provider a  list of all the medicines, herbs, non-prescription drugs, or dietary supplements you use. Also tell them if you smoke, drink alcohol, or use illegal drugs. Some items may interact with your medicine. What should I watch for while using this medication? Your condition will be monitored carefully while you are receiving this medication. You may need blood work done while you are taking this medication. What side effects may I notice from receiving this medication? Side effects that you should report to your care team as soon as possible: Allergic reactions--skin rash, itching, hives, swelling of the face, lips, tongue, or throat High potassium level--muscle weakness, fast or irregular heartbeat Side effects that usually do not require medical attention (report these to your care team if they continue or are bothersome): Pain, redness, or irritation at injection site This list may not describe all possible side effects. Call your doctor for medical advice about side effects. You may report side effects to FDA at 1-800-FDA-1088. Where should I keep my medication? This medication is given in a hospital or clinic and will not be stored at home. NOTE: This sheet is a summary. It may not cover all possible information. If you have questions about this medicine, talk to your doctor, pharmacist, or health care provider.  2024 [employer] (2021-08-16 00:00:00)

## 2023-04-16 ENCOUNTER — Ambulatory Visit: Payer: Medicare HMO

## 2023-04-17 ENCOUNTER — Ambulatory Visit: Payer: Medicare HMO

## 2023-04-17 ENCOUNTER — Encounter: Payer: Self-pay | Admitting: Oncology

## 2023-04-17 ENCOUNTER — Other Ambulatory Visit: Payer: Self-pay

## 2023-04-17 ENCOUNTER — Inpatient Hospital Stay: Payer: Medicare HMO

## 2023-04-17 ENCOUNTER — Inpatient Hospital Stay (HOSPITAL_BASED_OUTPATIENT_CLINIC_OR_DEPARTMENT_OTHER): Payer: Medicare HMO | Admitting: Oncology

## 2023-04-17 VITALS — BP 129/85 | HR 112 | Temp 99.0°F | Resp 17 | Wt 113.0 lb

## 2023-04-17 DIAGNOSIS — Z808 Family history of malignant neoplasm of other organs or systems: Secondary | ICD-10-CM | POA: Diagnosis not present

## 2023-04-17 DIAGNOSIS — Z87891 Personal history of nicotine dependence: Secondary | ICD-10-CM | POA: Diagnosis not present

## 2023-04-17 DIAGNOSIS — T66XXXA Radiation sickness, unspecified, initial encounter: Secondary | ICD-10-CM

## 2023-04-17 DIAGNOSIS — Z8521 Personal history of malignant neoplasm of larynx: Secondary | ICD-10-CM

## 2023-04-17 DIAGNOSIS — L270 Generalized skin eruption due to drugs and medicaments taken internally: Secondary | ICD-10-CM

## 2023-04-17 DIAGNOSIS — R918 Other nonspecific abnormal finding of lung field: Secondary | ICD-10-CM | POA: Diagnosis not present

## 2023-04-17 DIAGNOSIS — Z5111 Encounter for antineoplastic chemotherapy: Secondary | ICD-10-CM | POA: Diagnosis not present

## 2023-04-17 DIAGNOSIS — C3412 Malignant neoplasm of upper lobe, left bronchus or lung: Secondary | ICD-10-CM | POA: Diagnosis not present

## 2023-04-17 DIAGNOSIS — Z510 Encounter for antineoplastic radiation therapy: Secondary | ICD-10-CM | POA: Diagnosis not present

## 2023-04-17 DIAGNOSIS — Z853 Personal history of malignant neoplasm of breast: Secondary | ICD-10-CM | POA: Diagnosis not present

## 2023-04-17 DIAGNOSIS — K208 Other esophagitis without bleeding: Secondary | ICD-10-CM

## 2023-04-17 DIAGNOSIS — R12 Heartburn: Secondary | ICD-10-CM | POA: Diagnosis not present

## 2023-04-17 DIAGNOSIS — C3492 Malignant neoplasm of unspecified part of left bronchus or lung: Secondary | ICD-10-CM

## 2023-04-17 DIAGNOSIS — C50511 Malignant neoplasm of lower-outer quadrant of right female breast: Secondary | ICD-10-CM | POA: Diagnosis not present

## 2023-04-17 DIAGNOSIS — B379 Candidiasis, unspecified: Secondary | ICD-10-CM | POA: Diagnosis not present

## 2023-04-17 MED ORDER — FENTANYL 12 MCG/HR TD PT72: 1.0000 | MEDICATED_PATCH | TRANSDERMAL | 0 refills | Status: DC

## 2023-04-17 MED ORDER — PREDNISOLONE SODIUM PHOSPHATE 15 MG/5ML PO SOLN: ORAL | 0 refills | Status: DC

## 2023-04-17 NOTE — Progress Notes (Signed)
Lightheaded and worsening rash

## 2023-04-17 NOTE — Progress Notes (Signed)
Hematology/Oncology Consult note [facility]  [phone] [phone]  Patient Care Team: Jacky Kindle, FNP as PCP - General (Family Medicine) Debbe Odea, MD as PCP - Cardiology (Cardiology) Creig Hines, MD as Consulting Physician (Oncology) Leafy Ro, MD as Consulting Physician (General Surgery) Dasher, Cliffton Asters, MD (Dermatology) Scheeler, Kermit Balo, PA-C as Physician Assistant (Plastic Surgery) Minna Antis, MD as Attending Physician (Emergency Medicine) Alvira Philips, Leitha Bleak (Neurology) Dayna Barker, MD as Referring Physician (Internal Medicine) Coralyn Helling, MD (Inactive) as Consulting Physician (Pulmonary Disease) Gaspar Cola, RPH (Inactive) (Pharmacist) Pa, [facility] (Optometry) Glory Buff, RN as Oncology Nurse Navigator Carmina Miller, MD as Consulting Physician (Radiation Oncology)   Name of the patient: Denise Watts  [id number]  1955/08/30   Date of visit: 04/17/23  Diagnosis- non-small cell carcinoma of unknown primary   Chief complaint/ Reason for visit-on treatment assessment prior to cycle 5 of weekly CarboTaxol chemotherapy  Heme/Onc history:  Oncology History Overview Note  1. Carcinoma of breast, T1, N0, M0 tumor diagnosis.  In January of 1997 2. Carcinoma of the vocal cord T1, N0, M0 tumor.  Status postradiation therapy 3. Abnormal CT scan of the chest (December, 2015) 4. Repeat CT scan of chest shows stable nodule (July, 2016) 5. Neutropenia with normal hemoglobin and normal platelet count (July, 2016)   Cancer of vocal cord [facility]) (Resolved)  11/10/2014 Initial Diagnosis   Cancer of vocal cord   Malignant neoplasm of lower-outer quadrant of right breast of female, estrogen receptor negative (HCC)  11/24/2017 Initial Diagnosis   Malignant neoplasm of lower-outer quadrant of right breast of female, estrogen receptor negative (HCC)   01/12/2018 Cancer Staging    Staging form: Breast, AJCC 8th Edition - Clinical stage from 01/12/2018: Stage IB (cT1a, cN0, cM0, G3, ER+, PR-, HER2-) - Signed by Creig Hines, MD on 01/12/2018   01/30/2018 - 04/26/2018 Chemotherapy   The patient had dexamethasone (DECADRON) 4 MG tablet, 8 mg, Oral, 2 times daily, 1 of 1 cycle, Start date: 01/12/2018, End date: 08/03/2018 palonosetron (ALOXI) injection 0.25 mg, 0.25 mg, Intravenous,  Once, 4 of 4 cycles Administration: 0.25 mg (01/30/2018), 0.25 mg (02/27/2018), 0.25 mg (03/28/2018), 0.25 mg (04/26/2018) pegfilgrastim (NEULASTA ONPRO KIT) injection 6 mg, 6 mg, Subcutaneous, Once, 4 of 4 cycles Administration: 6 mg (01/30/2018), 6 mg (02/27/2018), 6 mg (03/28/2018), 6 mg (04/26/2018) cyclophosphamide (CYTOXAN) 1,000 mg in sodium chloride 0.9 % 250 mL chemo infusion, 600 mg/m2 = 1,000 mg, Intravenous,  Once, 4 of 4 cycles Administration: 1,000 mg (01/30/2018), 1,000 mg (02/27/2018), 1,000 mg (03/28/2018), 1,000 mg (04/26/2018) DOCEtaxel (TAXOTERE) 130 mg in sodium chloride 0.9 % 250 mL chemo infusion, 75 mg/m2 = 130 mg, Intravenous,  Once, 4 of 4 cycles Dose modification: 60 mg/m2 (original dose 75 mg/m2, Cycle 2, Reason: Dose not tolerated) Administration: 130 mg (01/30/2018), 100 mg (02/27/2018), 100 mg (03/28/2018), 100 mg (04/26/2018)  for chemotherapy treatment.    Non-small cell lung cancer (HCC)  03/09/2023 Initial Diagnosis   Non-small cell lung cancer (HCC)   03/20/2023 -  Chemotherapy   Patient is on Treatment Plan : LUNG Carboplatin + Paclitaxel q7d      The patient was originally diagnosed with right breast cancer in 1997, 10 mm grade 3, ER pr positive her 2 negative, node negative status post surgery and radiation treatment only. She never received chemotherapy or hormone therapy. For her vocal cord cancer, she had extensive surgery and radiation therapy in  1998. Due to past history of smoking, she was also noted to have lung nodules. She is undergoing active  surveillance imaging study of the chest.   Patient noted to have adrenal mass on CT chest which prompted PET CT in July 2018 which showed: IMPRESSION: 1. Right adrenal mass with peripheral hypermetabolism. Given interval development since 11/18/2015, suspicious for either isolated metastasis or a primary adrenal neoplasm. This should be considered for biopsy. 2. No evidence of hypermetabolic primary malignancy or extraadrenal metastasis. Pulmonary nodules are not significantly hypermetabolic. 3. Coronary artery atherosclerosis. Aortic Atherosclerosis (ICD10-I70.0). 4. Hepatic steatosis. 5. Left sixth rib hypermetabolism is favored to be related to remote Trauma.   Patient underwent right adrenalectomy which showed: DIAGNOSIS:  A. ADRENAL GLAND, RIGHT; ADRENALECTOMY:  - HIGH GRADE CARCINOMA WITH EXTENSIVE NECROSIS.  - MARGINS OF THE SPECIMEN ARE NEGATIVE FOR CARCINOMA.   Comment:  Sections demonstrate abundant necrosis confined to the adrenal medullary  compartment. In one block of tissue a small fragment of viable malignant  neoplasm was identified. A panel of immunohistochemical stains was  performed with the following pattern of immunoreactivity:  Super pancytokeratin: Positive  GATA-3: Negative  TTF-1: Negative  P40: Negative  Napsin: Negative  PAX-8: Negative  Chromogranin: Negative     Repeat CT chest abdomen and pelvis in July 2019 showed stable left lower lobe pulmonary nodule.  No other evidence of metastatic disease.  New 4.5 mm nodule in the right breast.  This was followed by ultrasound mammogram and core biopsy of the breast lesion which was a grade 3 triple negative breast cancer.  Patient was seen by Dr. Lemar Livings and underwent lumpectomy on 12/08/2017.   Patient had a 5 mm weakly ER positive tumor.  Given her ongoing fatigue Oncotype testing was done to see if she could potentially avoid chemotherapy.  Oncotype shows a recurrence score of 53 with her risk of distant  recurrence at 9 years at greater than 39% with hormone therapy alone.  Absolute benefit of chemotherapy was greater than 15% in her age group.  Patient completed 4 cycles of adjuvant TC chemotherapy in December 2019    Patient has known bilateral pulmonary nodules which have been monitored and she was again found to have a left lower lobe lung nodule measuring 13 mm unchanged as compared to prior exams.  There were also new subcentimeter lung nodules detected and follow-up CT was recommended.  CT angio chest in August 2024 showed increase in the size of left upper lobe lung nodule and new multiple pulmonary nodules concerning for metastatic disease.  This was followed by a PET CT scan which showed hypermetabolic intrathoracic nodal adenopathy measuring 5 to 13 mm in size along with lung nodules with a maximum size measuring 13 mm with an SUV of 9.2.   CT-guided biopsy was nondiagnostic she is patient underwent bronchoscopy but lymph nodes were not sampled at that time due to equipment malfunction.Bronchial lavage from left upper lobe was positive for malignancy non-small cell carcinoma.  But no further subclassification whether this is primary lung cancer or metastatic carcinoma could be done due to limited specimen. EBUS station 7 LN also shows non small cell carcinoma      Interval history-patient has developed a generalized skin rash over the last 4 to 5 days.  She reports difficulty swallowing and therefore all her medications were converted to liquid form.  She is mostly sustaining herself on liquid foods such as broth.  Reports left-sided back pain.  States as needed  oxycodone by itself has not been effective in controlling her pain  ECOG PS- 3 Pain scale- 5   Review of systems- Review of Systems  Constitutional:  Positive for malaise/fatigue. Negative for chills, fever and weight loss.  HENT:  Negative for congestion, ear discharge and nosebleeds.   Eyes:  Negative for blurred vision.   Respiratory:  Negative for cough, hemoptysis, sputum production, shortness of breath and wheezing.   Cardiovascular:  Negative for chest pain, palpitations, orthopnea and claudication.  Gastrointestinal:  Negative for abdominal pain, blood in stool, constipation, diarrhea, heartburn, melena, nausea and vomiting.       Difficulty swallowing  Genitourinary:  Negative for dysuria, flank pain, frequency, hematuria and urgency.  Musculoskeletal:  Negative for back pain, joint pain and myalgias.  Skin:  Negative for rash.  Neurological:  Negative for dizziness, tingling, focal weakness, seizures, weakness and headaches.  Endo/Heme/Allergies:  Does not bruise/bleed easily.  Psychiatric/Behavioral:  Negative for depression and suicidal ideas. The patient does not have insomnia.       Allergies  Allergen Reactions   Sulfa Antibiotics Other (See Comments)    Mouth sore and raw     Past Medical History:  Diagnosis Date   Adenomatous colon polyp    Adrenal carcinoma, right (HCC) 12/28/2016   a.) high grade carcinoma with extensive necrosis --> s/p adrenalectomy   Anxiety    Aortic atherosclerosis (HCC)    Arthritis of right knee    Atrial fibrillation and flutter (HCC)    a.) CHA2DS2-VASc = 3 (age, sex, vascular disease history) as of 02/13/2023; b.) cardiac rate/rhythm maintained intrinsically without pharmacological intervention; no chronic OAC (does take low dose ASA)   B12 deficiency    Breast cancer (HCC) 1997   a.) stage IB (cT1a, cN0, cM0, G3, ER+, PR-, HER2-); s/p lumpectomy + XRT   COPD (chronic obstructive pulmonary disease) (HCC)    Coronary artery disease    a.) cCTA 12/09/2021: Ca2+ = 135 (80th %'ile for age/sex/race match contol) --> 25-49% pLAD and RCA distributions   Depression    Diastolic dysfunction    a.) TTE 12/11/2020, no RWMAs, norm RVSF, G1DD; b.) TTE 12/14/2021: EF 55-60, no RWMAs, norm RVSF, G1DD; c.) TTE 01/27/2023: EF 50-55%, no RWMAs, G1DD, mild RAE, mild MR,  mod TR   Dry cough 03/02/2017   Dyspnea    Full dentures    GERD (gastroesophageal reflux disease)    History of hiatal hernia    HLD (hyperlipidemia)    Invasive carcinoma of breast (HCC) 11/15/2017   a.) path (+) for G3, ER -, PR -, HER2/neu - ; s/p lumpectomy 01/17/2018 + 4 cycles TC systemic chemotherapy + XRT   Long term current use of aspirin    Personal history of chemotherapy    Personal history of radiation therapy    Pneumothorax of left lung after biopsy 01/25/2023   a.) hypoxic in PACU requiring re-intubation --> transferred to ICU --> chest tube placed by PCCM   RLS (restless legs syndrome)    a.) on ropinirole   Skin cancer    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   a.) Stage IB (cT1a, cN0, cM0, G3, ER+, PR-, HER2-)     Past Surgical History:  Procedure Laterality Date   BIOPSY  01/05/2023   Procedure: BIOPSY;  Surgeon: Toney Reil, MD;  Location: [facility];  Service: Gastroenterology;;   BREAST BIOPSY Right 1997   positive   BREAST BIOPSY Right 11/15/2017   INVASIVE  MAMMARY CARCINOMA triple negative   BREAST BIOPSY Right 07/20/2020   [place] bx, Q marker, neg   BREAST LUMPECTOMY Right 1997   2019 also   BREAST LUMPECTOMY WITH SENTINEL LYMPH NODE BIOPSY Right 12/08/2017   Procedure: BREAST LUMPECTOMY WITH SENTINEL LYMPH NODE BX;  Surgeon: Earline Mayotte, MD;  Location: [facility];  Service: General;  Laterality: Right;   BREAST REDUCTION WITH MASTOPEXY Left 06/10/2019   Procedure: Left breast mastopexy reduction for symmetry;  Surgeon: Peggye Form, DO;  Location: [facility];  Service: Plastics;  Laterality: Left;  total 2.5 hours   CATARACT EXTRACTION W/PHACO Right 02/07/2022   Procedure: CATARACT EXTRACTION PHACO AND INTRAOCULAR LENS PLACEMENT (IOC) RIGHT DIABETIC;  Surgeon: Nevada Crane, MD;  Location: [facility];  Service: Ophthalmology;  Laterality: Right;  3.60 00:34.7   CATARACT EXTRACTION W/PHACO  Left 02/28/2022   Procedure: CATARACT EXTRACTION PHACO AND INTRAOCULAR LENS PLACEMENT (IOC) LEFT DIABETIC 4.11 00:26.1;  Surgeon: Nevada Crane, MD;  Location: [facility];  Service: Ophthalmology;  Laterality: Left;   COLONOSCOPY  2015   COLONOSCOPY N/A 01/05/2023   Procedure: COLONOSCOPY;  Surgeon: Toney Reil, MD;  Location: [facility] END[facility]COPY;  Service: Gastroenterology;  Laterality: N/A;   COLONOSCOPY WITH PROPOFOL N/A 10/29/2019   Procedure: COLONOSCOPY WITH PROPOFOL;  Surgeon: Midge Minium, MD;  Location: [facility] ENDOSCOPY;  Service: Endoscopy;  Laterality: N/A;   ESOPHAGOGASTRODUODENOSCOPY (EGD) WITH PROPOFOL N/A 05/24/2019   Procedure: ESOPHAGOGASTRODUODENOSCOPY (EGD) WITH PROPOFOL;  Surgeon: Midge Minium, MD;  Location: [facility] END[facility]COPY;  Service: Endoscopy;  Laterality: N/A;   ESOPHAGOGASTRODUODENOSCOPY (EGD) WITH PROPOFOL N/A 09/06/2022   Procedure: ESOPHAGOGASTRODUODENOSCOPY (EGD) WITH PROPOFOL;  Surgeon: Midge Minium, MD;  Location: [facility];  Service: Endoscopy;  Laterality: N/A;   ESOPHAGOGASTRODUODENOSCOPY (EGD) WITH PROPOFOL  01/05/2023   Procedure: ESOPHAGOGASTRODUODENOSCOPY (EGD) WITH PROPOFOL;  Surgeon: Toney Reil, MD;  Location: [facility];  Service: Gastroenterology;;   ESOPHAGUS SURGERY     IR IMAGING GUIDED PORT INSERTION  02/07/2023   KNEE SURGERY Right    LIPOSUCTION WITH LIPOFILLING Right 06/10/2019   Procedure: right breast scar and capsule release with fat grafting;  Surgeon: Peggye Form, DO;  Location: [facility];  Service: Plastics;  Laterality: Right;   POLYPECTOMY  01/05/2023   Procedure: POLYPECTOMY;  Surgeon: Toney Reil, MD;  Location: [facility] ENDOSCOPY;  Service: Gastroenterology;;   [facility] REMOVAL     PORTACATH PLACEMENT Right 01/17/2018   Procedure: INSERTION PORT-A-CATH- RIGHT;  Surgeon: Earline Mayotte, MD;  Location: [facility];  Service: General;  Laterality: Right;   ROBOTIC ADRENALECTOMY  Right 12/28/2016   Procedure: ROBOTIC ADRENALECTOMY;  Surgeon: Vanna Scotland, MD;  Location: [facility];  Service: Urology;  Laterality: Right;   SKIN CANCER EXCISION     THROAT SURGERY  1998   throat cancer    TUBAL LIGATION     VENTRAL HERNIA REPAIR N/A 03/03/2017   Procedure: HERNIA REPAIR VENTRAL ADULT;  Surgeon: Ricarda Frame, MD;  Location: [facility];  Service: General;  Laterality: N/A;   VIDEO BRONCHOSCOPY WITH ENDOBRONCHIAL ULTRASOUND N/A 01/25/2023   Procedure: VIDEO BRONCHOSCOPY WITH ENDOBRONCHIAL ULTRASOUND;  Surgeon: Vida Rigger, MD;  Location: [facility];  Service: Thoracic;  Laterality: N/A;   VIDEO BRONCHOSCOPY WITH ENDOBRONCHIAL ULTRASOUND N/A 02/17/2023   Procedure: VIDEO BRONCHOSCOPY WITH ENDOBRONCHIAL ULTRASOUND;  Surgeon: Vida Rigger, MD;  Location: [facility];  Service: Thoracic;  Laterality: N/A;    Social History   Socioeconomic History   Marital status:  Married    Spouse name: Gerelene, Seja (Spouse) [phone] ([place])   Number of children: 4   Years of education: Not on file   Highest education level: 10th grade  Occupational History   Occupation: retired  Tobacco Use   Smoking status: Former    Current packs/day: 0.00    Average packs/day: 1.5 packs/day for 30.0 years (45.0 ttl pk-yrs)    Types: Cigarettes    Start date: 05/02/1965    Quit date: 05/03/1995    Years since quitting: 27.9    Passive exposure: Past   Smokeless tobacco: Never  Vaping Use   Vaping status: Never Used  Substance and Sexual Activity   Alcohol use: Not Currently    Alcohol/week: 4.0 standard drinks of alcohol    Types: 4 Shots of liquor per week    Comment: none   Drug use: No   Sexual activity: Not Currently    Birth control/protection: Post-menopausal  Other Topics Concern   Not on file  Social History Narrative   Not on file   Social Drivers of Health   Financial Resource Strain: Low Risk  (12/02/2021)   Overall Financial Resource Strain (CARDIA)    Difficulty  of Paying Living Expenses: Not very hard  Food Insecurity: No Food Insecurity (12/05/2022)   Hunger Vital Sign    Worried About Running Out of Food in the Last Year: Never true    Ran Out of Food in the Last Year: Never true  Transportation Needs: No Transportation Needs (12/05/2022)   PRAPARE - Administrator, Civil Service (Medical): No    Lack of Transportation (Non-Medical): No  Physical Activity: Insufficiently Active (12/05/2022)   Exercise Vital Sign    Days of Exercise per Week: 3 days    Minutes of Exercise per Watts: 30 min  Stress: Stress Concern Present (12/05/2022)   [employer] of Occupational Health - Occupational Stress Questionnaire    Feeling of Stress : Rather much  Social Connections: Moderately Isolated (12/05/2022)   Social Connection and Isolation Panel [NHANES]    Frequency of Communication with Friends and Family: Three times a week    Frequency of Social Gatherings with Friends and Family: Twice a week    Attends Religious Services: Never    Database administrator or Organizations: No    Attends Banker Meetings: Never    Marital Status: Married  Catering manager Violence: Not At Risk (12/05/2022)   Humiliation, Afraid, Rape, and Kick questionnaire    Fear of Current or Ex-Partner: No    Emotionally Abused: No    Physically Abused: No    Sexually Abused: No    Family History  Problem Relation Age of Onset   Diabetes Mother    Heart attack Mother    Cancer Sister        cancer of eyes, spread to lung, other places. died at 17   Cancer Sister 66       unknown primary, spread to bone, brain died in 26's   Cancer Paternal Aunt        unk type   Cancer Paternal Grandmother        type unk   Prostate cancer Neg Hx    Kidney cancer Neg Hx    Bladder Cancer Neg Hx    Breast cancer Neg Hx      Current Outpatient Medications:    sucralfate (CARAFATE) 1 GM/10ML suspension, Take 10 mLs (1 g total) by mouth 4 (four)  times daily -   with meals and at bedtime., Disp: 420 mL, Rfl: 0   aspirin EC 81 MG tablet, Take 1 tablet (81 mg total) by mouth daily. Swallow whole. (Patient not taking: Reported on 04/17/2023), Disp: 90 tablet, Rfl: 3   benzonatate (TESSALON) 100 MG capsule, Take 2 capsules (200 mg total) by mouth 2 (two) times daily as needed for cough. (Patient not taking: Reported on 04/17/2023), Disp: 180 capsule, Rfl: 0   buPROPion (WELLBUTRIN XL) 150 MG 24 hr tablet, Take 3 tablets (450 mg total) by mouth daily. (Patient not taking: Reported on 04/17/2023), Disp: 90 tablet, Rfl: 1   busPIRone (BUSPAR) 5 MG tablet, Take 1 tablet (5 mg total) by mouth 3 (three) times daily. (Patient not taking: Reported on 04/17/2023), Disp: 270 tablet, Rfl: 0   cyclobenzaprine (FLEXERIL) 5 MG tablet, TAKE 1 TABLET BY MOUTH AT BEDTIME (Patient not taking: Reported on 04/17/2023), Disp: 30 tablet, Rfl: 0   dexamethasone (DECADRON) 4 MG tablet, Take 2 tablets daily for 2 days, start the day after chemotherapy. Take with food. (Patient not taking: Reported on 04/17/2023), Disp: 30 tablet, Rfl: 1   DULoxetine (CYMBALTA) 60 MG capsule, Take 1 capsule (60 mg total) by mouth 2 (two) times daily. (Patient not taking: Reported on 04/12/2023), Disp: 180 capsule, Rfl: 0   fluconazole (DIFLUCAN) 10 MG/ML suspension, Take 10 mLs (100 mg total) by mouth daily. (Patient not taking: Reported on 04/17/2023), Disp: 200 mL, Rfl: 0   lidocaine (LIDODERM) 5 %, Place 1 patch onto the skin daily. Remove & Discard patch within 12 hours or as directed by MD (Patient not taking: Reported on 04/17/2023), Disp: , Rfl:    lidocaine-prilocaine (EMLA) cream, Apply to affected area once (Patient not taking: Reported on 04/17/2023), Disp: 30 g, Rfl: 3   magic mouthwash w/lidocaine SOLN, Take 5 mLs by mouth 3 (three) times daily as needed for mouth pain. Suspension contains equal amounts of Maalox Extra Strength, nystatin, diphenhydramine and lidocaine. (Patient not taking:  Reported on 04/17/2023), Disp: 240 mL, Rfl: 0   montelukast (SINGULAIR) 10 MG tablet, Take 1 tablet (10 mg total) by mouth at bedtime. (Patient not taking: Reported on 04/17/2023), Disp: 90 tablet, Rfl: 3   omeprazole (KONVOMEP) 2 mg/mL SUSP oral suspension, Take 20 mLs (40 mg total) by mouth daily. (Patient not taking: Reported on 04/17/2023), Disp: 240 mL, Rfl: 1   omeprazole (PRILOSEC) 20 MG capsule, Take 1 capsule (20 mg total) by mouth 2 (two) times daily before a meal. (Patient not taking: Reported on 04/17/2023), Disp: 60 capsule, Rfl: 11   ondansetron (ZOFRAN) 8 MG tablet, Take 1 tablet (8 mg total) by mouth every 8 (eight) hours as needed for nausea or vomiting. Start on the third day after chemotherapy. (Patient not taking: Reported on 04/17/2023), Disp: 30 tablet, Rfl: 1   ondansetron (ZOFRAN-ODT) 4 MG disintegrating tablet, Take 1 tablet (4 mg total) by mouth every 8 (eight) hours as needed for nausea or vomiting. (Patient not taking: Reported on 04/17/2023), Disp: 20 tablet, Rfl: 0   oxyCODONE (ROXICODONE) 5 MG/5ML solution, Take 5 mLs (5 mg total) by mouth every 4 (four) hours as needed for severe pain (pain score 7-10). (Patient not taking: Reported on 04/17/2023), Disp: 200 mL, Rfl: 0   prochlorperazine (COMPAZINE) 10 MG tablet, Take 1 tablet (10 mg total) by mouth every 6 (six) hours as needed for nausea or vomiting. (Patient not taking: Reported on 04/17/2023), Disp: 30 tablet, Rfl: 1   rOPINIRole (  REQUIP) 0.25 MG tablet, TAKE 1 TABLET BY MOUTH AT BEDTIME (Patient not taking: Reported on 04/17/2023), Disp: 90 tablet, Rfl: 1   rosuvastatin (CRESTOR) 40 MG tablet, TAKE 1 TABLET BY MOUTH DAILY (Patient not taking: Reported on 04/17/2023), Disp: 90 tablet, Rfl: 1   sucralfate (CARAFATE) 1 g tablet, Take 1 tablet (1 g total) by mouth 3 (three) times daily before meals. (Patient not taking: Reported on 04/17/2023), Disp: 90 tablet, Rfl: 4   tolterodine (DETROL LA) 4 MG 24 hr capsule, Take 1  capsule (4 mg total) by mouth daily. (Patient not taking: Reported on 04/17/2023), Disp: 30 capsule, Rfl: 11 No current facility-administered medications for this visit.  Facility-Administered Medications Ordered in Other Visits:    cyanocobalamin ((VITAMIN B-12)) injection 1,000 mcg, 1,000 mcg, Intramuscular, Q30 days, Creig Hines, MD, 1,000 mcg at 08/27/21 1537   cyanocobalamin (VITAMIN B12) injection 1,000 mcg, 1,000 mcg, Intramuscular, Q30 days, Creig Hines, MD, 1,000 mcg at 03/16/22 1101  Physical exam:  Vitals:   04/17/23 0932  BP: 129/85  Pulse: (!) 112  Resp: 17  Temp: 99 F (37.2 C)  TempSrc: Tympanic  SpO2: 96%  Weight: 113 lb (51.3 kg)   Physical Exam Constitutional:      Comments: She is thin and frail sitting in a wheelchair  HENT:     Mouth/Throat:     Mouth: Mucous membranes are moist.     Pharynx: Oropharynx is clear.  Cardiovascular:     Rate and Rhythm: Normal rate and regular rhythm.     Heart sounds: Normal heart sounds.  Pulmonary:     Effort: Pulmonary effort is normal.     Breath sounds: Normal breath sounds.  Abdominal:     General: Bowel sounds are normal.     Palpations: Abdomen is soft.  Skin:    General: Skin is warm and dry.     Comments: Generalized erythematous maculopapular skin rash noted over bilateral forearms and lower extremities  Neurological:     Mental Status: She is alert and oriented to person, place, and time.         Latest Ref Rng & Units 04/14/2023   10:01 AM  CMP  Glucose 70 - 99 mg/dL 161   BUN 8 - 23 mg/dL 26   Creatinine 0.96 - 1.00 mg/dL 0.45   Sodium 409 - 811 mmol/L 137   Potassium 3.5 - 5.1 mmol/L 3.3   Chloride 98 - 111 mmol/L 102   CO2 22 - 32 mmol/L 24   Calcium 8.9 - 10.3 mg/dL 8.8       Latest Ref Rng & Units 04/12/2023    9:39 AM  CBC  WBC 4.0 - 10.5 K/uL 7.8   Hemoglobin 12.0 - 15.0 g/dL 91.4   Hematocrit 78.2 - 46.0 % 39.7   Platelets 150 - 400 K/uL 140     Assessment and plan-  Patient is a 67 y.o. female  with history of head and neck, breast cancer as well as adrenal carcinoma of unknown primary in the past now with non-small cell carcinoma of unknown primary predominantly with mediastinal adenopathy.  She is being treated as possible non-small cell lung cancer.  She is here for on treatment assessment prior to cycle 5 of weekly CarboTaxol chemotherapy  Given ongoing radiation esophagitis both radiation and chemotherapy will be on hold for 2 weeks.  Treatment deferred by 2 weeks.  Drug-induced skin rash: I am giving her prednisolone solution starting at 50 mg for  2 days and reducing the dose by 10 mg every 2 days and she will complete her steroid taper in 10 days.  Suspect this is secondary to Taxol.   Generalized bodyaches: Unrelated to malignancy.  Suspect low back pain is also musculoskeletal.  Hopefully steroid taper should work.  She is also on as needed oxycodone I will start her on low-dose 12 mcg fentanyl patch as she is unable to tolerate anything orally other than liquid formulations.  She will be seen next week by [facility] with repeat labs and for possible fluids.  I am holding off on giving her fluids today.  She also has nutrition appointment later this week.  She will be seen by covering provider in 2 weeks for possible cycle 5 of weekly CarboTaxol chemotherapy   Visit Diagnosis 1. Non-small cell cancer of left lung (HCC)   2. Drug-induced skin rash   3. Radiation esophagitis      Dr. Owens Shark, MD, MPH [facility] [phone] 04/17/2023 10:01 AM

## 2023-04-18 ENCOUNTER — Ambulatory Visit: Payer: Medicare HMO

## 2023-04-18 ENCOUNTER — Telehealth: Payer: Self-pay | Admitting: *Deleted

## 2023-04-18 NOTE — Telephone Encounter (Signed)
Called patient to let her know that Dr/ Chrystal would like her to resume treatments and that he had not recommended a break. Patient did not answer, answering machine full.

## 2023-04-19 ENCOUNTER — Telehealth: Payer: Self-pay | Admitting: *Deleted

## 2023-04-19 ENCOUNTER — Ambulatory Visit: Payer: Medicare HMO

## 2023-04-19 ENCOUNTER — Inpatient Hospital Stay: Payer: Medicare HMO

## 2023-04-19 NOTE — Progress Notes (Signed)
Nutrition  Patient did not show up for scheduled nutrition appointment.  Message sent to scheduling to offer another appointment and to referring provider  Kiela Shisler B. Freida Busman, RD, LDN Registered Dietitian [phone]

## 2023-04-19 NOTE — Telephone Encounter (Signed)
Attempted to call patients emergency contact regarding her failure to attend radiation treatments message machine full no able to leave message.

## 2023-04-20 ENCOUNTER — Ambulatory Visit: Payer: Medicare HMO

## 2023-04-21 ENCOUNTER — Ambulatory Visit: Payer: Medicare HMO

## 2023-04-24 ENCOUNTER — Ambulatory Visit: Payer: Medicare HMO

## 2023-04-24 ENCOUNTER — Ambulatory Visit
Admission: RE | Admit: 2023-04-24 | Discharge: 2023-04-24 | Disposition: A | Discharge status: Home / Self Care | Payer: Medicare HMO | Source: Ambulatory Visit | Attending: Radiation Oncology | Admitting: Radiation Oncology

## 2023-04-24 ENCOUNTER — Other Ambulatory Visit: Payer: Medicare HMO

## 2023-04-24 ENCOUNTER — Other Ambulatory Visit: Payer: Self-pay

## 2023-04-24 DIAGNOSIS — B379 Candidiasis, unspecified: Secondary | ICD-10-CM | POA: Diagnosis not present

## 2023-04-24 DIAGNOSIS — Z853 Personal history of malignant neoplasm of breast: Secondary | ICD-10-CM | POA: Diagnosis not present

## 2023-04-24 DIAGNOSIS — Z510 Encounter for antineoplastic radiation therapy: Secondary | ICD-10-CM | POA: Diagnosis not present

## 2023-04-24 DIAGNOSIS — L270 Generalized skin eruption due to drugs and medicaments taken internally: Secondary | ICD-10-CM | POA: Diagnosis not present

## 2023-04-24 DIAGNOSIS — C44622 Squamous cell carcinoma of skin of right upper limb, including shoulder: Secondary | ICD-10-CM | POA: Diagnosis not present

## 2023-04-24 DIAGNOSIS — C3412 Malignant neoplasm of upper lobe, left bronchus or lung: Secondary | ICD-10-CM | POA: Diagnosis not present

## 2023-04-24 DIAGNOSIS — K208 Other esophagitis without bleeding: Secondary | ICD-10-CM | POA: Diagnosis not present

## 2023-04-24 DIAGNOSIS — D0471 Carcinoma in situ of skin of right lower limb, including hip: Secondary | ICD-10-CM | POA: Diagnosis not present

## 2023-04-24 DIAGNOSIS — R12 Heartburn: Secondary | ICD-10-CM | POA: Diagnosis not present

## 2023-04-24 DIAGNOSIS — D0462 Carcinoma in situ of skin of left upper limb, including shoulder: Secondary | ICD-10-CM | POA: Diagnosis not present

## 2023-04-24 DIAGNOSIS — C50511 Malignant neoplasm of lower-outer quadrant of right female breast: Secondary | ICD-10-CM | POA: Diagnosis not present

## 2023-04-24 DIAGNOSIS — D485 Neoplasm of uncertain behavior of skin: Secondary | ICD-10-CM | POA: Diagnosis not present

## 2023-04-24 DIAGNOSIS — D0461 Carcinoma in situ of skin of right upper limb, including shoulder: Secondary | ICD-10-CM | POA: Diagnosis not present

## 2023-04-24 DIAGNOSIS — Z5111 Encounter for antineoplastic chemotherapy: Secondary | ICD-10-CM | POA: Diagnosis not present

## 2023-04-24 DIAGNOSIS — Z87891 Personal history of nicotine dependence: Secondary | ICD-10-CM | POA: Diagnosis not present

## 2023-04-24 LAB — RAD ONC ARIA SESSION SUMMARY
Course Elapsed Days: 33
Plan Fractions Treated to Date: 14
Plan Prescribed Dose Per Fraction: 2 Gy
Plan Total Fractions Prescribed: 33
Plan Total Prescribed Dose: 66 Gy
Reference Point Dosage Given to Date: 28 Gy
Reference Point Session Dosage Given: 2 Gy
Session Number: 14

## 2023-04-25 ENCOUNTER — Other Ambulatory Visit: Payer: Self-pay

## 2023-04-25 ENCOUNTER — Ambulatory Visit
Admission: RE | Admit: 2023-04-25 | Discharge: 2023-04-25 | Disposition: A | Discharge status: Home / Self Care | Payer: Medicare HMO | Source: Ambulatory Visit | Attending: Radiation Oncology | Admitting: Radiation Oncology

## 2023-04-25 DIAGNOSIS — R12 Heartburn: Secondary | ICD-10-CM | POA: Diagnosis not present

## 2023-04-25 DIAGNOSIS — Z853 Personal history of malignant neoplasm of breast: Secondary | ICD-10-CM | POA: Diagnosis not present

## 2023-04-25 DIAGNOSIS — Z87891 Personal history of nicotine dependence: Secondary | ICD-10-CM | POA: Diagnosis not present

## 2023-04-25 DIAGNOSIS — L270 Generalized skin eruption due to drugs and medicaments taken internally: Secondary | ICD-10-CM | POA: Diagnosis not present

## 2023-04-25 DIAGNOSIS — Z5111 Encounter for antineoplastic chemotherapy: Secondary | ICD-10-CM | POA: Diagnosis not present

## 2023-04-25 DIAGNOSIS — C3412 Malignant neoplasm of upper lobe, left bronchus or lung: Secondary | ICD-10-CM | POA: Diagnosis not present

## 2023-04-25 DIAGNOSIS — K208 Other esophagitis without bleeding: Secondary | ICD-10-CM | POA: Diagnosis not present

## 2023-04-25 DIAGNOSIS — Z510 Encounter for antineoplastic radiation therapy: Secondary | ICD-10-CM | POA: Diagnosis not present

## 2023-04-25 DIAGNOSIS — C50511 Malignant neoplasm of lower-outer quadrant of right female breast: Secondary | ICD-10-CM | POA: Diagnosis not present

## 2023-04-25 DIAGNOSIS — B379 Candidiasis, unspecified: Secondary | ICD-10-CM | POA: Diagnosis not present

## 2023-04-25 LAB — RAD ONC ARIA SESSION SUMMARY
Course Elapsed Days: 34
Plan Fractions Treated to Date: 15
Plan Prescribed Dose Per Fraction: 2 Gy
Plan Total Fractions Prescribed: 33
Plan Total Prescribed Dose: 66 Gy
Reference Point Dosage Given to Date: 30 Gy
Reference Point Session Dosage Given: 2 Gy
Session Number: 15

## 2023-04-27 ENCOUNTER — Other Ambulatory Visit: Payer: Self-pay

## 2023-04-27 ENCOUNTER — Ambulatory Visit
Admission: RE | Admit: 2023-04-27 | Discharge: 2023-04-27 | Disposition: A | Discharge status: Home / Self Care | Payer: Medicare HMO | Source: Ambulatory Visit | Attending: Radiation Oncology | Admitting: Radiation Oncology

## 2023-04-27 DIAGNOSIS — L270 Generalized skin eruption due to drugs and medicaments taken internally: Secondary | ICD-10-CM | POA: Diagnosis not present

## 2023-04-27 DIAGNOSIS — C50511 Malignant neoplasm of lower-outer quadrant of right female breast: Secondary | ICD-10-CM | POA: Diagnosis not present

## 2023-04-27 DIAGNOSIS — T451X5A Adverse effect of antineoplastic and immunosuppressive drugs, initial encounter: Secondary | ICD-10-CM | POA: Diagnosis not present

## 2023-04-27 DIAGNOSIS — T66XXXA Radiation sickness, unspecified, initial encounter: Secondary | ICD-10-CM | POA: Diagnosis not present

## 2023-04-27 DIAGNOSIS — B379 Candidiasis, unspecified: Secondary | ICD-10-CM | POA: Diagnosis not present

## 2023-04-27 DIAGNOSIS — C3412 Malignant neoplasm of upper lobe, left bronchus or lung: Secondary | ICD-10-CM | POA: Diagnosis not present

## 2023-04-27 DIAGNOSIS — Z8521 Personal history of malignant neoplasm of larynx: Secondary | ICD-10-CM | POA: Diagnosis not present

## 2023-04-27 DIAGNOSIS — Z809 Family history of malignant neoplasm, unspecified: Secondary | ICD-10-CM | POA: Diagnosis not present

## 2023-04-27 DIAGNOSIS — Z87891 Personal history of nicotine dependence: Secondary | ICD-10-CM | POA: Diagnosis not present

## 2023-04-27 DIAGNOSIS — Z853 Personal history of malignant neoplasm of breast: Secondary | ICD-10-CM | POA: Diagnosis not present

## 2023-04-27 DIAGNOSIS — Z5111 Encounter for antineoplastic chemotherapy: Secondary | ICD-10-CM | POA: Diagnosis not present

## 2023-04-27 DIAGNOSIS — Z510 Encounter for antineoplastic radiation therapy: Secondary | ICD-10-CM | POA: Diagnosis not present

## 2023-04-27 DIAGNOSIS — R12 Heartburn: Secondary | ICD-10-CM | POA: Diagnosis not present

## 2023-04-27 DIAGNOSIS — K208 Other esophagitis without bleeding: Secondary | ICD-10-CM | POA: Diagnosis not present

## 2023-04-27 LAB — RAD ONC ARIA SESSION SUMMARY
Course Elapsed Days: 36
Plan Fractions Treated to Date: 16
Plan Prescribed Dose Per Fraction: 2 Gy
Plan Total Fractions Prescribed: 33
Plan Total Prescribed Dose: 66 Gy
Reference Point Dosage Given to Date: 32 Gy
Reference Point Session Dosage Given: 2 Gy
Session Number: 16

## 2023-04-28 ENCOUNTER — Encounter: Payer: Self-pay | Admitting: Nurse Practitioner

## 2023-04-28 ENCOUNTER — Inpatient Hospital Stay (HOSPITAL_BASED_OUTPATIENT_CLINIC_OR_DEPARTMENT_OTHER): Payer: Medicare HMO | Admitting: Nurse Practitioner

## 2023-04-28 ENCOUNTER — Other Ambulatory Visit: Payer: Self-pay

## 2023-04-28 ENCOUNTER — Inpatient Hospital Stay: Payer: Medicare HMO

## 2023-04-28 ENCOUNTER — Ambulatory Visit
Admission: RE | Admit: 2023-04-28 | Discharge: 2023-04-28 | Disposition: A | Discharge status: Home / Self Care | Payer: Medicare HMO | Source: Ambulatory Visit | Attending: Radiation Oncology | Admitting: Radiation Oncology

## 2023-04-28 ENCOUNTER — Telehealth: Payer: Self-pay | Admitting: *Deleted

## 2023-04-28 VITALS — BP 123/76 | HR 98 | Temp 97.8°F | Resp 16 | Ht 61.0 in | Wt 111.0 lb

## 2023-04-28 DIAGNOSIS — Z809 Family history of malignant neoplasm, unspecified: Secondary | ICD-10-CM | POA: Diagnosis not present

## 2023-04-28 DIAGNOSIS — Z8521 Personal history of malignant neoplasm of larynx: Secondary | ICD-10-CM | POA: Diagnosis not present

## 2023-04-28 DIAGNOSIS — Z510 Encounter for antineoplastic radiation therapy: Secondary | ICD-10-CM | POA: Diagnosis not present

## 2023-04-28 DIAGNOSIS — L270 Generalized skin eruption due to drugs and medicaments taken internally: Secondary | ICD-10-CM

## 2023-04-28 DIAGNOSIS — C50511 Malignant neoplasm of lower-outer quadrant of right female breast: Secondary | ICD-10-CM | POA: Diagnosis not present

## 2023-04-28 DIAGNOSIS — K208 Other esophagitis without bleeding: Secondary | ICD-10-CM

## 2023-04-28 DIAGNOSIS — Z853 Personal history of malignant neoplasm of breast: Secondary | ICD-10-CM

## 2023-04-28 DIAGNOSIS — C3492 Malignant neoplasm of unspecified part of left bronchus or lung: Secondary | ICD-10-CM

## 2023-04-28 DIAGNOSIS — Z87891 Personal history of nicotine dependence: Secondary | ICD-10-CM

## 2023-04-28 DIAGNOSIS — T451X5A Adverse effect of antineoplastic and immunosuppressive drugs, initial encounter: Secondary | ICD-10-CM

## 2023-04-28 DIAGNOSIS — Z95828 Presence of other vascular implants and grafts: Secondary | ICD-10-CM

## 2023-04-28 DIAGNOSIS — Z5111 Encounter for antineoplastic chemotherapy: Secondary | ICD-10-CM | POA: Diagnosis not present

## 2023-04-28 DIAGNOSIS — T66XXXA Radiation sickness, unspecified, initial encounter: Secondary | ICD-10-CM | POA: Diagnosis not present

## 2023-04-28 DIAGNOSIS — B379 Candidiasis, unspecified: Secondary | ICD-10-CM | POA: Diagnosis not present

## 2023-04-28 DIAGNOSIS — C3412 Malignant neoplasm of upper lobe, left bronchus or lung: Secondary | ICD-10-CM | POA: Diagnosis not present

## 2023-04-28 DIAGNOSIS — R12 Heartburn: Secondary | ICD-10-CM | POA: Diagnosis not present

## 2023-04-28 LAB — CBC WITH DIFFERENTIAL (CANCER CENTER ONLY)
Abs Immature Granulocytes: 0.04 10*3/uL (ref 0.00–0.07)
Basophils Absolute: 0 10*3/uL (ref 0.0–0.1)
Basophils Relative: 1 %
Eosinophils Absolute: 0 10*3/uL (ref 0.0–0.5)
Eosinophils Relative: 0 %
HCT: 33.3 % — ABNORMAL LOW (ref 36.0–46.0)
Hemoglobin: 11.3 g/dL — ABNORMAL LOW (ref 12.0–15.0)
Immature Granulocytes: 1 %
Lymphocytes Relative: 9 %
Lymphs Abs: 0.4 10*3/uL — ABNORMAL LOW (ref 0.7–4.0)
MCH: 32.8 pg (ref 26.0–34.0)
MCHC: 33.9 g/dL (ref 30.0–36.0)
MCV: 96.5 fL (ref 80.0–100.0)
Monocytes Absolute: 0.5 10*3/uL (ref 0.1–1.0)
Monocytes Relative: 10 %
Neutro Abs: 3.5 10*3/uL (ref 1.7–7.7)
Neutrophils Relative %: 79 %
Platelet Count: 148 10*3/uL — ABNORMAL LOW (ref 150–400)
RBC: 3.45 MIL/uL — ABNORMAL LOW (ref 3.87–5.11)
RDW: 14 % (ref 11.5–15.5)
WBC Count: 4.4 10*3/uL (ref 4.0–10.5)
nRBC: 0 % (ref 0.0–0.2)

## 2023-04-28 LAB — RAD ONC ARIA SESSION SUMMARY
Course Elapsed Days: 37
Plan Fractions Treated to Date: 17
Plan Prescribed Dose Per Fraction: 2 Gy
Plan Total Fractions Prescribed: 33
Plan Total Prescribed Dose: 66 Gy
Reference Point Dosage Given to Date: 34 Gy
Reference Point Session Dosage Given: 2 Gy
Session Number: 17

## 2023-04-28 LAB — BASIC METABOLIC PANEL - CANCER CENTER ONLY
Anion gap: 12 (ref 5–15)
BUN: 12 mg/dL (ref 8–23)
CO2: 26 mmol/L (ref 22–32)
Calcium: 9.1 mg/dL (ref 8.9–10.3)
Chloride: 96 mmol/L — ABNORMAL LOW (ref 98–111)
Creatinine: 0.69 mg/dL (ref 0.44–1.00)
GFR, Estimated: >60 mL/min (ref >60)
Glucose, Bld: 102 mg/dL — ABNORMAL HIGH (ref 70–99)
Potassium: 3.6 mmol/L (ref 3.5–5.1)
Sodium: 134 mmol/L — ABNORMAL LOW (ref 135–145)

## 2023-04-28 MED ORDER — PREDNISOLONE SODIUM PHOSPHATE 15 MG/5ML PO SOLN: ORAL | 0 refills | Status: AC

## 2023-04-28 MED ORDER — SODIUM CHLORIDE 0.9% FLUSH
10.0000 mL | Freq: Once | INTRAVENOUS | Status: AC
  Administered 2023-04-28: 10 mL via INTRAVENOUS
  Filled 2023-04-28: qty 10

## 2023-04-28 MED ORDER — HEPARIN SOD (PORK) LOCK FLUSH 100 UNIT/ML IV SOLN
500.0000 [IU] | Freq: Once | INTRAVENOUS | Status: AC
  Administered 2023-04-28: 500 [IU]
  Filled 2023-04-28: qty 5

## 2023-04-28 MED ORDER — FENTANYL 12 MCG/HR TD PT72: 1.0000 | MEDICATED_PATCH | TRANSDERMAL | 0 refills | Status: DC

## 2023-04-28 NOTE — Telephone Encounter (Signed)
 I spoke with patient. Apt with lauren was scheduled at 2pm originally and radiation at 8am. She is aware that we will accommodate her after her radiation apt tomorrow. Msg sent to brooke to change the apts times for tom.  Patient stated that she is still feeling very weak and feels that she will most likely need the IV fluids. Pt appreciative to have her apts moved up to am so she doesn't have to drive back to clinic just for iv fluids and see app.

## 2023-04-28 NOTE — Progress Notes (Signed)
Hematology/Oncology Consult Note [facility]  [phone] [phone]  Patient Care Team: Jacky Kindle, FNP as PCP - General (Family Medicine) Debbe Odea, MD as PCP - Cardiology (Cardiology) Creig Hines, MD as Consulting Physician (Oncology) Leafy Ro, MD as Consulting Physician (General Surgery) Dasher, Cliffton Asters, MD (Dermatology) Scheeler, Kermit Balo, PA-C as Physician Assistant (Plastic Surgery) Minna Antis, MD as Attending Physician (Emergency Medicine) Alvira Philips, Leitha Bleak (Neurology) Dayna Barker, MD as Referring Physician (Internal Medicine) Coralyn Helling, MD (Inactive) as Consulting Physician (Pulmonary Disease) Gaspar Cola, RPH (Inactive) (Pharmacist) Pa, [facility] (Optometry) Glory Buff, RN as Oncology Nurse Navigator Carmina Miller, MD as Consulting Physician (Radiation Oncology)   Name of the patient: Denise Watts [address]  [id number]  10-30-1955   Date of visit: 04/28/23  Diagnosis- non-small cell carcinoma of unknown primary   Chief complaint/ Reason for visit- chemotherapy follow up  Heme/Onc history:  Oncology History Overview Note  1. Carcinoma of breast, T1, N0, M0 tumor diagnosis.  In January of 1997 2. Carcinoma of the vocal cord T1, N0, M0 tumor.  Status postradiation therapy 3. Abnormal CT scan of the chest (December, 2015) 4. Repeat CT scan of chest shows stable nodule (July, 2016) 5. Neutropenia with normal hemoglobin and normal platelet count (July, 2016)   Cancer of vocal cord [facility]) (Resolved)  11/10/2014 Initial Diagnosis   Cancer of vocal cord   Malignant neoplasm of lower-outer quadrant of right breast of female, estrogen receptor negative (HCC)  11/24/2017 Initial Diagnosis   Malignant neoplasm of lower-outer quadrant of right breast of female, estrogen receptor negative (HCC)   01/12/2018 Cancer Staging   Staging form: Breast, AJCC 8th Edition - Clinical  stage from 01/12/2018: Stage IB (cT1a, cN0, cM0, G3, ER+, PR-, HER2-) - Signed by Creig Hines, MD on 01/12/2018   01/30/2018 - 04/26/2018 Chemotherapy   The patient had dexamethasone (DECADRON) 4 MG tablet, 8 mg, Oral, 2 times daily, 1 of 1 cycle, Start date: 01/12/2018, End date: 08/03/2018 palonosetron (ALOXI) injection 0.25 mg, 0.25 mg, Intravenous,  Once, 4 of 4 cycles Administration: 0.25 mg (01/30/2018), 0.25 mg (02/27/2018), 0.25 mg (03/28/2018), 0.25 mg (04/26/2018) pegfilgrastim (NEULASTA ONPRO KIT) injection 6 mg, 6 mg, Subcutaneous, Once, 4 of 4 cycles Administration: 6 mg (01/30/2018), 6 mg (02/27/2018), 6 mg (03/28/2018), 6 mg (04/26/2018) cyclophosphamide (CYTOXAN) 1,000 mg in sodium chloride 0.9 % 250 mL chemo infusion, 600 mg/m2 = 1,000 mg, Intravenous,  Once, 4 of 4 cycles Administration: 1,000 mg (01/30/2018), 1,000 mg (02/27/2018), 1,000 mg (03/28/2018), 1,000 mg (04/26/2018) DOCEtaxel (TAXOTERE) 130 mg in sodium chloride 0.9 % 250 mL chemo infusion, 75 mg/m2 = 130 mg, Intravenous,  Once, 4 of 4 cycles Dose modification: 60 mg/m2 (original dose 75 mg/m2, Cycle 2, Reason: Dose not tolerated) Administration: 130 mg (01/30/2018), 100 mg (02/27/2018), 100 mg (03/28/2018), 100 mg (04/26/2018)  for chemotherapy treatment.    Non-small cell lung cancer (HCC)  03/09/2023 Initial Diagnosis   Non-small cell lung cancer (HCC)   03/20/2023 -  Chemotherapy   Patient is on Treatment Plan : LUNG Carboplatin + Paclitaxel q7d      The patient was originally diagnosed with right breast cancer in 1997, 10 mm grade 3, ER pr positive her 2 negative, node negative status post surgery and radiation treatment only. She never received chemotherapy or hormone therapy. For her vocal cord cancer, she had extensive surgery and radiation therapy in 1998. Due to past history of smoking, she was  also noted to have lung nodules. She is undergoing active surveillance imaging study of the chest.   Patient noted  to have adrenal mass on CT chest which prompted PET CT in July 2018 which showed: IMPRESSION: 1. Right adrenal mass with peripheral hypermetabolism. Given interval development since 11/18/2015, suspicious for either isolated metastasis or a primary adrenal neoplasm. This should be considered for biopsy. 2. No evidence of hypermetabolic primary malignancy or extraadrenal metastasis. Pulmonary nodules are not significantly hypermetabolic.  3. Coronary artery atherosclerosis. Aortic Atherosclerosis (ICD10-I70.0). 4. Hepatic steatosis. 5. Left sixth rib hypermetabolism is favored to be related to remote trauma.   Patient underwent right adrenalectomy which showed: DIAGNOSIS:  A. ADRENAL GLAND, RIGHT; ADRENALECTOMY:  - HIGH GRADE CARCINOMA WITH EXTENSIVE NECROSIS.  - MARGINS OF THE SPECIMEN ARE NEGATIVE FOR CARCINOMA.   Comment:  Sections demonstrate abundant necrosis confined to the adrenal medullary compartment. In one block of tissue a small fragment of viable malignant neoplasm was identified. A panel of immunohistochemical stains was performed with the following pattern of immunoreactivity:  Super pancytokeratin: Positive  GATA-3: Negative  TTF-1: Negative  P40: Negative  Napsin: Negative  PAX-8: Negative  Chromogranin: Negative     Repeat CT chest abdomen and pelvis in July 2019 showed stable left lower lobe pulmonary nodule.  No other evidence of metastatic disease.  New 4.5 mm nodule in the right breast.  This was followed by ultrasound mammogram and core biopsy of the breast lesion which was a grade 3 triple negative breast cancer.  Patient was seen by Dr. Lemar Livings and underwent lumpectomy on 12/08/2017.   Patient had a 5 mm weakly ER positive tumor.  Given her ongoing fatigue Oncotype testing was done to see if she could potentially avoid chemotherapy.  Oncotype shows a recurrence score of 53 with her risk of distant recurrence at 9 years at greater than 39% with hormone therapy alone.   Absolute benefit of chemotherapy was greater than 15% in her age group.  Patient completed 4 cycles of adjuvant TC chemotherapy in December 2019    Patient has known bilateral pulmonary nodules which have been monitored and she was again found to have a left lower lobe lung nodule measuring 13 mm unchanged as compared to prior exams.  There were also new subcentimeter lung nodules detected and follow-up CT was recommended.  CT angio chest in August 2024 showed increase in the size of left upper lobe lung nodule and new multiple pulmonary nodules concerning for metastatic disease.  This was followed by a PET CT scan which showed hypermetabolic intrathoracic nodal adenopathy measuring 5 to 13 mm in size along with lung nodules with a maximum size measuring 13 mm with an SUV of 9.2.   CT-guided biopsy was nondiagnostic she is patient underwent bronchoscopy but lymph nodes were not sampled at that time due to equipment malfunction.Bronchial lavage from left upper lobe was positive for malignancy non-small cell carcinoma.  But no further subclassification whether this is primary lung cancer or metastatic carcinoma could be done due to limited specimen. EBUS station 7 LN also shows non small cell carcinoma      Interval history- Patient is 67 year old female who returns to clinic for follow up. She continues to have all over skin rash. It is unchanged. Swallowing improved with steroids and she has been able to take liquids but no solids or pills. She feels better.   ECOG PS- 3 Pain scale- 0   Review of systems- Review of Systems  Constitutional:  Positive for malaise/fatigue. Negative for chills, fever and weight loss.  HENT:  Negative for congestion, ear discharge and nosebleeds.   Eyes:  Negative for blurred vision.  Respiratory:  Negative for cough, hemoptysis, sputum production, shortness of breath and wheezing.   Cardiovascular:  Negative for chest pain, palpitations, orthopnea and claudication.   Gastrointestinal:  Negative for abdominal pain, blood in stool, constipation, diarrhea, heartburn, melena, nausea and vomiting.       Difficulty swallowing  Genitourinary:  Negative for dysuria, flank pain, frequency, hematuria and urgency.  Musculoskeletal:  Negative for back pain, joint pain and myalgias.  Skin:  Negative for rash.  Neurological:  Negative for dizziness, tingling, focal weakness, seizures, weakness and headaches.  Endo/Heme/Allergies:  Does not bruise/bleed easily.  Psychiatric/Behavioral:  Negative for depression and suicidal ideas. The patient does not have insomnia.       Allergies  Allergen Reactions   Sulfa Antibiotics Other (See Comments)    Mouth sore and raw     Past Medical History:  Diagnosis Date   Adenomatous colon polyp    Adrenal carcinoma, right (HCC) 12/28/2016   a.) high grade carcinoma with extensive necrosis --> s/p adrenalectomy   Anxiety    Aortic atherosclerosis (HCC)    Arthritis of right knee    Atrial fibrillation and flutter (HCC)    a.) CHA2DS2-VASc = 3 (age, sex, vascular disease history) as of 02/13/2023; b.) cardiac rate/rhythm maintained intrinsically without pharmacological intervention; no chronic OAC (does take low dose ASA)   B12 deficiency    Breast cancer (HCC) 1997   a.) stage IB (cT1a, cN0, cM0, G3, ER+, PR-, HER2-); s/p lumpectomy + XRT   COPD (chronic obstructive pulmonary disease) (HCC)    Coronary artery disease    a.) cCTA 12/09/2021: Ca2+ = 135 (80th %'ile for age/sex/race match contol) --> 25-49% pLAD and RCA distributions   Depression    Diastolic dysfunction    a.) TTE 12/11/2020, no RWMAs, norm RVSF, G1DD; b.) TTE 12/14/2021: EF 55-60, no RWMAs, norm RVSF, G1DD; c.) TTE 01/27/2023: EF 50-55%, no RWMAs, G1DD, mild RAE, mild MR, mod TR   Dry cough 03/02/2017   Dyspnea    Full dentures    GERD (gastroesophageal reflux disease)    History of hiatal hernia    HLD (hyperlipidemia)    Invasive carcinoma of  breast (HCC) 11/15/2017   a.) path (+) for G3, ER -, PR -, HER2/neu - ; s/p lumpectomy 01/17/2018 + 4 cycles TC systemic chemotherapy + XRT   Long term current use of aspirin    Personal history of chemotherapy    Personal history of radiation therapy    Pneumothorax of left lung after biopsy 01/25/2023   a.) hypoxic in PACU requiring re-intubation --> transferred to ICU --> chest tube placed by PCCM   RLS (restless legs syndrome)    a.) on ropinirole   Skin cancer    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   a.) Stage IB (cT1a, cN0, cM0, G3, ER+, PR-, HER2-)     Past Surgical History:  Procedure Laterality Date   BIOPSY  01/05/2023   Procedure: BIOPSY;  Surgeon: Toney Reil, MD;  Location: [facility] END[facility]COPY;  Service: Gastroenterology;;   BREAST BIOPSY Right 1997   positive   BREAST BIOPSY Right 11/15/2017   INVASIVE MAMMARY CARCINOMA triple negative   BREAST BIOPSY Right 07/20/2020   [place] bx, Q marker, neg   BREAST LUMPECTOMY Right 1997   2019 also  BREAST LUMPECTOMY WITH SENTINEL LYMPH NODE BIOPSY Right 12/08/2017   Procedure: BREAST LUMPECTOMY WITH SENTINEL LYMPH NODE BX;  Surgeon: Earline Mayotte, MD;  Location: [facility] ORS;  Service: General;  Laterality: Right;   BREAST REDUCTION WITH MASTOPEXY Left 06/10/2019   Procedure: Left breast mastopexy reduction for symmetry;  Surgeon: Peggye Form, DO;  Location: [facility];  Service: Plastics;  Laterality: Left;  total 2.5 hours   CATARACT EXTRACTION W/PHACO Right 02/07/2022   Procedure: CATARACT EXTRACTION PHACO AND INTRAOCULAR LENS PLACEMENT (IOC) RIGHT DIABETIC;  Surgeon: Nevada Crane, MD;  Location: [facility];  Service: Ophthalmology;  Laterality: Right;  3.60 00:34.7   CATARACT EXTRACTION W/PHACO Left 02/28/2022   Procedure: CATARACT EXTRACTION PHACO AND INTRAOCULAR LENS PLACEMENT (IOC) LEFT DIABETIC 4.11 00:26.1;  Surgeon: Nevada Crane, MD;  Location: [facility];  Service: Ophthalmology;  Laterality: Left;   COLONOSCOPY  2015   COLONOSCOPY N/A 01/05/2023   Procedure: COLONOSCOPY;  Surgeon: Toney Reil, MD;  Location: [facility] ENDOSCOPY;  Service: Gastroenterology;  Laterality: N/A;   COLONOSCOPY WITH PROPOFOL N/A 10/29/2019   Procedure: COLONOSCOPY WITH PROPOFOL;  Surgeon: Midge Minium, MD;  Location: [facility] ENDOSCOPY;  Service: Endoscopy;  Laterality: N/A;   ESOPHAGOGASTRODUODENOSCOPY (EGD) WITH PROPOFOL N/A 05/24/2019   Procedure: ESOPHAGOGASTRODUODENOSCOPY (EGD) WITH PROPOFOL;  Surgeon: Midge Minium, MD;  Location: [facility] ENDOSCOPY;  Service: Endoscopy;  Laterality: N/A;   ESOPHAGOGASTRODUODENOSCOPY (EGD) WITH PROPOFOL N/A 09/06/2022   Procedure: ESOPHAGOGASTRODUODENOSCOPY (EGD) WITH PROPOFOL;  Surgeon: Midge Minium, MD;  Location: [facility] ENDOSCOPY;  Service: Endoscopy;  Laterality: N/A;   ESOPHAGOGASTRODUODENOSCOPY (EGD) WITH PROPOFOL  01/05/2023   Procedure: ESOPHAGOGASTRODUODENOSCOPY (EGD) WITH PROPOFOL;  Surgeon: Toney Reil, MD;  Location: [facility] END[facility];  Service: Gastroenterology;;   ESOPHAGUS SURGERY     IR IMAGING GUIDED PORT INSERTION  02/07/2023   KNEE SURGERY Right    LIPOSUCTION WITH LIPOFILLING Right 06/10/2019   Procedure: right breast scar and capsule release with fat grafting;  Surgeon: Peggye Form, DO;  Location: [facility];  Service: Plastics;  Laterality: Right;   POLYPECTOMY  01/05/2023   Procedure: POLYPECTOMY;  Surgeon: Toney Reil, MD;  Location: [facility] ENDOSCOPY;  Service: Gastroenterology;;   [facility] REMOVAL     PORTACATH PLACEMENT Right 01/17/2018   Procedure: INSERTION PORT-A-CATH- RIGHT;  Surgeon: Earline Mayotte, MD;  Location: [facility]S;  Service: General;  Laterality: Right;   ROBOTIC ADRENALECTOMY Right 12/28/2016   Procedure: ROBOTIC ADRENALECTOMY;  Surgeon: Vanna Scotland, MD;  Location: [facility];  Service: Urology;  Laterality: Right;   SKIN CANCER EXCISION     THROAT  SURGERY  1998   throat cancer    TUBAL LIGATION     VENTRAL HERNIA REPAIR N/A 03/03/2017   Procedure: HERNIA REPAIR VENTRAL ADULT;  Surgeon: Ricarda Frame, MD;  Location: [facility] ORS;  Service: General;  Laterality: N/A;   VIDEO BRONCHOSCOPY WITH ENDOBRONCHIAL ULTRASOUND N/A 01/25/2023   Procedure: VIDEO BRONCHOSCOPY WITH ENDOBRONCHIAL ULTRASOUND;  Surgeon: Vida Rigger, MD;  Location: [facility]S;  Service: Thoracic;  Laterality: N/A;   VIDEO BRONCHOSCOPY WITH ENDOBRONCHIAL ULTRASOUND N/A 02/17/2023   Procedure: VIDEO BRONCHOSCOPY WITH ENDOBRONCHIAL ULTRASOUND;  Surgeon: Vida Rigger, MD;  Location: [facility];  Service: Thoracic;  Laterality: N/A;    Social History   Socioeconomic History   Marital status: Married    Spouse name: Jazzie, Syx (Spouse) [phone] ([place])   Number of children: 4   Years of education: Not on file   Highest education level:  10th grade  Occupational History   Occupation: retired  Tobacco Use   Smoking status: Former    Current packs/day: 0.00    Average packs/day: 1.5 packs/day for 30.0 years (45.0 ttl pk-yrs)    Types: Cigarettes    Start date: 05/02/1965    Quit date: 05/03/1995    Years since quitting: 28.0    Passive exposure: Past   Smokeless tobacco: Never  Vaping Use   Vaping status: Never Used  Substance and Sexual Activity   Alcohol use: Not Currently    Alcohol/week: 4.0 standard drinks of alcohol    Types: 4 Shots of liquor per week    Comment: none   Drug use: No   Sexual activity: Not Currently    Birth control/protection: Post-menopausal  Other Topics Concern   Not on file  Social History Narrative   Not on file   Social Drivers of Health   Financial Resource Strain: Low Risk  (12/02/2021)   Overall Financial Resource Strain (CARDIA)    Difficulty of Paying Living Expenses: Not very hard  Food Insecurity: No Food Insecurity (12/05/2022)   Hunger Vital Sign    Worried About Running Out of Food in the Last Year: Never true     Ran Out of Food in the Last Year: Never true  Transportation Needs: No Transportation Needs (12/05/2022)   PRAPARE - Administrator, Civil Service (Medical): No    Lack of Transportation (Non-Medical): No  Physical Activity: Insufficiently Active (12/05/2022)   Exercise Vital Sign    Days of Exercise per Week: 3 days    Minutes of Exercise per Session: 30 min  Stress: Stress Concern Present (12/05/2022)   [employer] of Occupational Health - Occupational Stress Questionnaire    Feeling of Stress : Rather much  Social Connections: Moderately Isolated (12/05/2022)   Social Connection and Isolation Panel [NHANES]    Frequency of Communication with Friends and Family: Three times a week    Frequency of Social Gatherings with Friends and Family: Twice a week    Attends Religious Services: Never    Database administrator or Organizations: No    Attends Banker Meetings: Never    Marital Status: Married  Catering manager Violence: Not At Risk (12/05/2022)   Humiliation, Afraid, Rape, and Kick questionnaire    Fear of Current or Ex-Partner: No    Emotionally Abused: No    Physically Abused: No    Sexually Abused: No    Family History  Problem Relation Age of Onset   Diabetes Mother    Heart attack Mother    Cancer Sister        cancer of eyes, spread to lung, other places. died at 47   Cancer Sister 61       unknown primary, spread to bone, brain died in 56's   Cancer Paternal Aunt        unk type   Cancer Paternal Grandmother        type unk   Prostate cancer Neg Hx    Kidney cancer Neg Hx    Bladder Cancer Neg Hx    Breast cancer Neg Hx      Current Outpatient Medications:    fentaNYL (DURAGESIC) 12 MCG/HR, Place 1 patch onto the skin every 3 (three) days., Disp: 5 patch, Rfl: 0   fluconazole (DIFLUCAN) 10 MG/ML suspension, Take 10 mLs (100 mg total) by mouth daily., Disp: 200 mL, Rfl: 0   lidocaine (LIDODERM)  5 %, Place 1 patch onto the skin daily.  Remove & Discard patch within 12 hours or as directed by MD, Disp: , Rfl:    lidocaine-prilocaine (EMLA) cream, Apply to affected area once, Disp: 30 g, Rfl: 3   omeprazole (KONVOMEP) 2 mg/mL SUSP oral suspension, Take 20 mLs (40 mg total) by mouth daily., Disp: 240 mL, Rfl: 1   ondansetron (ZOFRAN-ODT) 4 MG disintegrating tablet, Take 1 tablet (4 mg total) by mouth every 8 (eight) hours as needed for nausea or vomiting., Disp: 20 tablet, Rfl: 0   oxyCODONE (ROXICODONE) 5 MG/5ML solution, Take 5 mLs (5 mg total) by mouth every 4 (four) hours as needed for severe pain (pain score 7-10)., Disp: 200 mL, Rfl: 0   prednisoLONE (ORAPRED) 15 MG/5ML solution, Take 50mg  for 2 days, take 40mg  for 2 days, take 30mg  for 2 days, Take 20mg  for 2 days, Take 10mg  for 2 days and stop, Disp: 100 mL, Rfl: 0   sucralfate (CARAFATE) 1 GM/10ML suspension, Take 10 mLs (1 g total) by mouth 4 (four) times daily -  with meals and at bedtime., Disp: 420 mL, Rfl: 0   aspirin EC 81 MG tablet, Take 1 tablet (81 mg total) by mouth daily. Swallow whole. (Patient not taking: Reported on 04/12/2023), Disp: 90 tablet, Rfl: 3   benzonatate (TESSALON) 100 MG capsule, Take 2 capsules (200 mg total) by mouth 2 (two) times daily as needed for cough. (Patient not taking: Reported on 04/12/2023), Disp: 180 capsule, Rfl: 0   buPROPion (WELLBUTRIN XL) 150 MG 24 hr tablet, Take 3 tablets (450 mg total) by mouth daily. (Patient not taking: Reported on 04/12/2023), Disp: 90 tablet, Rfl: 1   busPIRone (BUSPAR) 5 MG tablet, Take 1 tablet (5 mg total) by mouth 3 (three) times daily. (Patient not taking: Reported on 04/12/2023), Disp: 270 tablet, Rfl: 0   cyclobenzaprine (FLEXERIL) 5 MG tablet, TAKE 1 TABLET BY MOUTH AT BEDTIME (Patient not taking: Reported on 04/12/2023), Disp: 30 tablet, Rfl: 0   dexamethasone (DECADRON) 4 MG tablet, Take 2 tablets daily for 2 days, start the day after chemotherapy. Take with food. (Patient not taking: Reported on  04/28/2023), Disp: 30 tablet, Rfl: 1   DULoxetine (CYMBALTA) 60 MG capsule, Take 1 capsule (60 mg total) by mouth 2 (two) times daily. (Patient not taking: Reported on 04/12/2023), Disp: 180 capsule, Rfl: 0   magic mouthwash w/lidocaine SOLN, Take 5 mLs by mouth 3 (three) times daily as needed for mouth pain. Suspension contains equal amounts of Maalox Extra Strength, nystatin, diphenhydramine and lidocaine. (Patient not taking: Reported on 04/28/2023), Disp: 240 mL, Rfl: 0   montelukast (SINGULAIR) 10 MG tablet, Take 1 tablet (10 mg total) by mouth at bedtime. (Patient not taking: Reported on 04/12/2023), Disp: 90 tablet, Rfl: 3   omeprazole (PRILOSEC) 20 MG capsule, Take 1 capsule (20 mg total) by mouth 2 (two) times daily before a meal. (Patient not taking: Reported on 04/12/2023), Disp: 60 capsule, Rfl: 11   ondansetron (ZOFRAN) 8 MG tablet, Take 1 tablet (8 mg total) by mouth every 8 (eight) hours as needed for nausea or vomiting. Start on the third day after chemotherapy. (Patient not taking: Reported on 04/28/2023), Disp: 30 tablet, Rfl: 1   prochlorperazine (COMPAZINE) 10 MG tablet, Take 1 tablet (10 mg total) by mouth every 6 (six) hours as needed for nausea or vomiting. (Patient not taking: Reported on 04/12/2023), Disp: 30 tablet, Rfl: 1   rOPINIRole (REQUIP) 0.25 MG tablet, TAKE  1 TABLET BY MOUTH AT BEDTIME (Patient not taking: Reported on 04/28/2023), Disp: 90 tablet, Rfl: 1   rosuvastatin (CRESTOR) 40 MG tablet, TAKE 1 TABLET BY MOUTH DAILY (Patient not taking: Reported on 04/12/2023), Disp: 90 tablet, Rfl: 1   sucralfate (CARAFATE) 1 g tablet, Take 1 tablet (1 g total) by mouth 3 (three) times daily before meals. (Patient not taking: Reported on 04/28/2023), Disp: 90 tablet, Rfl: 4   tolterodine (DETROL LA) 4 MG 24 hr capsule, Take 1 capsule (4 mg total) by mouth daily. (Patient not taking: Reported on 04/28/2023), Disp: 30 capsule, Rfl: 11 No current facility-administered medications for  this visit.  Facility-Administered Medications Ordered in Other Visits:    cyanocobalamin ((VITAMIN B-12)) injection 1,000 mcg, 1,000 mcg, Intramuscular, Q30 days, Creig Hines, MD, 1,000 mcg at 08/27/21 1537   cyanocobalamin (VITAMIN B12) injection 1,000 mcg, 1,000 mcg, Intramuscular, Q30 days, Creig Hines, MD, 1,000 mcg at 03/16/22 1101  Physical exam:  Vitals:   04/28/23 0930 04/28/23 0939  BP: 123/76   Pulse: 98   Resp: 16   Temp: 97.8 F (36.6 C)   TempSrc: Tympanic   SpO2: 96%   Weight:  111 lb (50.3 kg)  Height:  5\' 1"  (1.549 m)   Physical Exam Vitals reviewed.  Constitutional:      General: She is not in acute distress.    Comments: Thin. Frail appearing. In recliner sipping water.   HENT:     Mouth/Throat:     Mouth: Mucous membranes are moist.     Pharynx: Oropharynx is clear. No oropharyngeal exudate or posterior oropharyngeal erythema.  Cardiovascular:     Rate and Rhythm: Normal rate and regular rhythm.  Pulmonary:     Effort: Pulmonary effort is normal. No respiratory distress.  Skin:    General: Skin is warm and dry.     Coloration: Skin is not pale.     Findings: Rash present.     Comments: Generalized erythematous maculopapular skin rash noted over bilateral forearms and lower extremities. No rash on back or abdomen.   Neurological:     Mental Status: She is alert and oriented to person, place, and time.  Psychiatric:        Mood and Affect: Mood normal.        Behavior: Behavior normal.         Latest Ref Rng & Units 04/28/2023    9:28 AM  CMP  Glucose 70 - 99 mg/dL 161   BUN 8 - 23 mg/dL 12   Creatinine 0.96 - 1.00 mg/dL 0.45   Sodium 409 - 811 mmol/L 134   Potassium 3.5 - 5.1 mmol/L 3.6   Chloride 98 - 111 mmol/L 96   CO2 22 - 32 mmol/L 26   Calcium 8.9 - 10.3 mg/dL 9.1       Latest Ref Rng & Units 04/28/2023    9:28 AM  CBC  WBC 4.0 - 10.5 K/uL 4.4   Hemoglobin 12.0 - 15.0 g/dL 91.4   Hematocrit 78.2 - 46.0 % 33.3   Platelets  150 - 400 K/uL 148     Assessment and plan- Patient is a 67 y.o. female  with history of head and neck, breast cancer as well as adrenal carcinoma of unknown primary in the past now with non-small cell carcinoma of unknown primary predominantly with mediastinal adenopathy.  She is being treated as possible non-small cell lung cancer.  She is here for chemotherapy follow up.  Radiation esophagitis- radiation and chemotherapy held x 2 weeks. No evidence of fungal component. Improved with steroids. Plan to refill prednisolone 15 mg x 2 days then 7.5 mg x 2 days. I'll see her back next week to see if we can restart chemotherapy. REviewed calorie replacements for nutrition. Hold fluids today as she is tolerating oral rehydration.   Drug-induced skin rash: likely secondary to taxol?. S/p prednisolone. Not improved. Restart prednisolone. Recheck next week.   Generalized bodyaches: Unrelated to malignancy.  Suspect low back pain is also musculoskeletal. She is also on as needed oxycodone I will start her on low-dose 12 mcg fentanyl patch as she is unable to tolerate anything orally other than liquid formulations.   Visit Diagnosis 1. Skin changes related to chemotherapy   2. Radiation esophagitis     Consuello Masse, DNP, AGNP-C, [facility] [phone] (clinic) 04/28/2023

## 2023-04-29 ENCOUNTER — Other Ambulatory Visit: Payer: Self-pay | Admitting: Hospice and Palliative Medicine

## 2023-05-01 ENCOUNTER — Ambulatory Visit
Admission: RE | Admit: 2023-05-01 | Discharge: 2023-05-01 | Disposition: A | Discharge status: Home / Self Care | Payer: Medicare HMO | Source: Ambulatory Visit | Attending: Radiation Oncology | Admitting: Radiation Oncology

## 2023-05-01 ENCOUNTER — Encounter: Payer: Self-pay | Admitting: Oncology

## 2023-05-01 ENCOUNTER — Inpatient Hospital Stay (HOSPITAL_BASED_OUTPATIENT_CLINIC_OR_DEPARTMENT_OTHER): Payer: Medicare HMO | Admitting: Nurse Practitioner

## 2023-05-01 ENCOUNTER — Ambulatory Visit: Payer: Medicare HMO

## 2023-05-01 ENCOUNTER — Inpatient Hospital Stay: Payer: Medicare HMO

## 2023-05-01 ENCOUNTER — Other Ambulatory Visit: Payer: Medicare HMO

## 2023-05-01 ENCOUNTER — Ambulatory Visit: Payer: Medicare HMO | Admitting: Nurse Practitioner

## 2023-05-01 ENCOUNTER — Encounter: Payer: Self-pay | Admitting: Nurse Practitioner

## 2023-05-01 ENCOUNTER — Other Ambulatory Visit: Payer: Self-pay

## 2023-05-01 VITALS — BP 113/77 | HR 105 | Temp 98.4°F | Wt 113.0 lb

## 2023-05-01 DIAGNOSIS — Z5111 Encounter for antineoplastic chemotherapy: Secondary | ICD-10-CM

## 2023-05-01 DIAGNOSIS — C3412 Malignant neoplasm of upper lobe, left bronchus or lung: Secondary | ICD-10-CM | POA: Diagnosis not present

## 2023-05-01 DIAGNOSIS — Z853 Personal history of malignant neoplasm of breast: Secondary | ICD-10-CM

## 2023-05-01 DIAGNOSIS — T451X5A Adverse effect of antineoplastic and immunosuppressive drugs, initial encounter: Secondary | ICD-10-CM | POA: Diagnosis not present

## 2023-05-01 DIAGNOSIS — B379 Candidiasis, unspecified: Secondary | ICD-10-CM | POA: Diagnosis not present

## 2023-05-01 DIAGNOSIS — R634 Abnormal weight loss: Secondary | ICD-10-CM

## 2023-05-01 DIAGNOSIS — Z95828 Presence of other vascular implants and grafts: Secondary | ICD-10-CM

## 2023-05-01 DIAGNOSIS — R21 Rash and other nonspecific skin eruption: Secondary | ICD-10-CM

## 2023-05-01 DIAGNOSIS — Z87891 Personal history of nicotine dependence: Secondary | ICD-10-CM

## 2023-05-01 DIAGNOSIS — T66XXXA Radiation sickness, unspecified, initial encounter: Secondary | ICD-10-CM

## 2023-05-01 DIAGNOSIS — C50511 Malignant neoplasm of lower-outer quadrant of right female breast: Secondary | ICD-10-CM | POA: Diagnosis not present

## 2023-05-01 DIAGNOSIS — Z85858 Personal history of malignant neoplasm of other endocrine glands: Secondary | ICD-10-CM

## 2023-05-01 DIAGNOSIS — K208 Other esophagitis without bleeding: Secondary | ICD-10-CM | POA: Diagnosis not present

## 2023-05-01 DIAGNOSIS — C3492 Malignant neoplasm of unspecified part of left bronchus or lung: Secondary | ICD-10-CM

## 2023-05-01 DIAGNOSIS — R12 Heartburn: Secondary | ICD-10-CM | POA: Diagnosis not present

## 2023-05-01 DIAGNOSIS — R6889 Other general symptoms and signs: Secondary | ICD-10-CM

## 2023-05-01 DIAGNOSIS — R058 Other specified cough: Secondary | ICD-10-CM

## 2023-05-01 DIAGNOSIS — L270 Generalized skin eruption due to drugs and medicaments taken internally: Secondary | ICD-10-CM | POA: Diagnosis not present

## 2023-05-01 DIAGNOSIS — Z510 Encounter for antineoplastic radiation therapy: Secondary | ICD-10-CM | POA: Diagnosis not present

## 2023-05-01 LAB — CBC WITH DIFFERENTIAL (CANCER CENTER ONLY)
Abs Immature Granulocytes: 0.04 10*3/uL (ref 0.00–0.07)
Basophils Absolute: 0 10*3/uL (ref 0.0–0.1)
Basophils Relative: 0 %
Eosinophils Absolute: 0.2 10*3/uL (ref 0.0–0.5)
Eosinophils Relative: 3 %
HCT: 32.8 % — ABNORMAL LOW (ref 36.0–46.0)
Hemoglobin: 10.9 g/dL — ABNORMAL LOW (ref 12.0–15.0)
Immature Granulocytes: 1 %
Lymphocytes Relative: 11 %
Lymphs Abs: 0.6 10*3/uL — ABNORMAL LOW (ref 0.7–4.0)
MCH: 32.5 pg (ref 26.0–34.0)
MCHC: 33.2 g/dL (ref 30.0–36.0)
MCV: 97.9 fL (ref 80.0–100.0)
Monocytes Absolute: 0.4 10*3/uL (ref 0.1–1.0)
Monocytes Relative: 8 %
Neutro Abs: 4.4 10*3/uL (ref 1.7–7.7)
Neutrophils Relative %: 77 %
Platelet Count: 163 10*3/uL (ref 150–400)
RBC: 3.35 MIL/uL — ABNORMAL LOW (ref 3.87–5.11)
RDW: 14.6 % (ref 11.5–15.5)
WBC Count: 5.7 10*3/uL (ref 4.0–10.5)
nRBC: 0 % (ref 0.0–0.2)

## 2023-05-01 LAB — CMP (CANCER CENTER ONLY)
ALT: 17 U/L (ref 0–44)
AST: 27 U/L (ref 15–41)
Albumin: 3.2 g/dL — ABNORMAL LOW (ref 3.5–5.0)
Alkaline Phosphatase: 69 U/L (ref 38–126)
Anion gap: 12 (ref 5–15)
BUN: 11 mg/dL (ref 8–23)
CO2: 25 mmol/L (ref 22–32)
Calcium: 8.9 mg/dL (ref 8.9–10.3)
Chloride: 99 mmol/L (ref 98–111)
Creatinine: 0.68 mg/dL (ref 0.44–1.00)
GFR, Estimated: >60 mL/min (ref >60)
Glucose, Bld: 92 mg/dL (ref 70–99)
Potassium: 3.4 mmol/L — ABNORMAL LOW (ref 3.5–5.1)
Sodium: 136 mmol/L (ref 135–145)
Total Bilirubin: 0.6 mg/dL (ref <1.2)
Total Protein: 6.2 g/dL — ABNORMAL LOW (ref 6.5–8.1)

## 2023-05-01 LAB — RAD ONC ARIA SESSION SUMMARY
Course Elapsed Days: 40
Plan Fractions Treated to Date: 18
Plan Prescribed Dose Per Fraction: 2 Gy
Plan Total Fractions Prescribed: 33
Plan Total Prescribed Dose: 66 Gy
Reference Point Dosage Given to Date: 36 Gy
Reference Point Session Dosage Given: 2 Gy
Session Number: 18

## 2023-05-01 MED ORDER — LEVOFLOXACIN 25 MG/ML PO SOLN: 500.0000 mg | Freq: Every day | ORAL | 0 refills | Status: DC

## 2023-05-01 MED ORDER — HEPARIN SOD (PORK) LOCK FLUSH 100 UNIT/ML IV SOLN
500.0000 [IU] | Freq: Once | INTRAVENOUS | Status: AC
  Administered 2023-05-01: 500 [IU]
  Filled 2023-05-01: qty 5

## 2023-05-01 NOTE — Progress Notes (Signed)
Hematology/Oncology Consult Note [facility]  [phone] [phone]  Patient Care Team: Jacky Kindle, FNP as PCP - General (Family Medicine) Debbe Odea, MD as PCP - Cardiology (Cardiology) Creig Hines, MD as Consulting Physician (Oncology) Leafy Ro, MD as Consulting Physician (General Surgery) Dasher, Cliffton Asters, MD (Dermatology) Scheeler, Kermit Balo, PA-C as Physician Assistant (Plastic Surgery) Minna Antis, MD as Attending Physician (Emergency Medicine) Alvira Philips, Leitha Bleak (Neurology) Dayna Barker, MD as Referring Physician (Internal Medicine) Coralyn Helling, MD (Inactive) as Consulting Physician (Pulmonary Disease) Gaspar Cola, RPH (Inactive) (Pharmacist) Pa, [facility] (Optometry) Glory Buff, RN as Oncology Nurse Navigator Carmina Miller, MD as Consulting Physician (Radiation Oncology)   Name of the patient: Denise Watts  [id number]  1955-07-28   Date of visit: 05/01/23  Diagnosis- non-small cell carcinoma of unknown primary   Chief complaint/ Reason for visit- on treatment assessment prior to cycle 5 of weekly CarboTaxol chemotherapy   Heme/Onc history:  Oncology History Overview Note  1. Carcinoma of breast, T1, N0, M0 tumor diagnosis.  In January of 1997 2. Carcinoma of the vocal cord T1, N0, M0 tumor.  Status postradiation therapy 3. Abnormal CT scan of the chest (December, 2015) 4. Repeat CT scan of chest shows stable nodule (July, 2016) 5. Neutropenia with normal hemoglobin and normal platelet count (July, 2016)   Cancer of vocal cord [facility]) (Resolved)  11/10/2014 Initial Diagnosis   Cancer of vocal cord   Malignant neoplasm of lower-outer quadrant of right breast of female, estrogen receptor negative (HCC)  11/24/2017 Initial Diagnosis   Malignant neoplasm of lower-outer quadrant of right breast of female, estrogen receptor negative (HCC)   01/12/2018 Cancer Staging    Staging form: Breast, AJCC 8th Edition - Clinical stage from 01/12/2018: Stage IB (cT1a, cN0, cM0, G3, ER+, PR-, HER2-) - Signed by Creig Hines, MD on 01/12/2018   01/30/2018 - 04/26/2018 Chemotherapy   The patient had dexamethasone (DECADRON) 4 MG tablet, 8 mg, Oral, 2 times daily, 1 of 1 cycle, Start date: 01/12/2018, End date: 08/03/2018 palonosetron (ALOXI) injection 0.25 mg, 0.25 mg, Intravenous,  Once, 4 of 4 cycles Administration: 0.25 mg (01/30/2018), 0.25 mg (02/27/2018), 0.25 mg (03/28/2018), 0.25 mg (04/26/2018) pegfilgrastim (NEULASTA ONPRO KIT) injection 6 mg, 6 mg, Subcutaneous, Once, 4 of 4 cycles Administration: 6 mg (01/30/2018), 6 mg (02/27/2018), 6 mg (03/28/2018), 6 mg (04/26/2018) cyclophosphamide (CYTOXAN) 1,000 mg in sodium chloride 0.9 % 250 mL chemo infusion, 600 mg/m2 = 1,000 mg, Intravenous,  Once, 4 of 4 cycles Administration: 1,000 mg (01/30/2018), 1,000 mg (02/27/2018), 1,000 mg (03/28/2018), 1,000 mg (04/26/2018) DOCEtaxel (TAXOTERE) 130 mg in sodium chloride 0.9 % 250 mL chemo infusion, 75 mg/m2 = 130 mg, Intravenous,  Once, 4 of 4 cycles Dose modification: 60 mg/m2 (original dose 75 mg/m2, Cycle 2, Reason: Dose not tolerated) Administration: 130 mg (01/30/2018), 100 mg (02/27/2018), 100 mg (03/28/2018), 100 mg (04/26/2018)  for chemotherapy treatment.    Non-small cell lung cancer (HCC)  03/09/2023 Initial Diagnosis   Non-small cell lung cancer (HCC)   03/20/2023 -  Chemotherapy   Patient is on Treatment Plan : LUNG Carboplatin + Paclitaxel q7d      The patient was originally diagnosed with right breast cancer in 1997, 10 mm grade 3, ER pr positive her 2 negative, node negative status post surgery and radiation treatment only. She never received chemotherapy or hormone therapy. For her vocal cord cancer, she had extensive surgery and radiation therapy in  1998. Due to past history of smoking, she was also noted to have lung nodules. She is undergoing active  surveillance imaging study of the chest.   Patient noted to have adrenal mass on CT chest which prompted PET CT in July 2018 which showed: IMPRESSION: 1. Right adrenal mass with peripheral hypermetabolism. Given interval development since 11/18/2015, suspicious for either isolated metastasis or a primary adrenal neoplasm. This should be considered for biopsy. 2. No evidence of hypermetabolic primary malignancy or extraadrenal metastasis. Pulmonary nodules are not significantly hypermetabolic.  3. Coronary artery atherosclerosis. Aortic Atherosclerosis (ICD10-I70.0). 4. Hepatic steatosis. 5. Left sixth rib hypermetabolism is favored to be related to remote trauma.   Patient underwent right adrenalectomy which showed: DIAGNOSIS:  A. ADRENAL GLAND, RIGHT; ADRENALECTOMY:  - HIGH GRADE CARCINOMA WITH EXTENSIVE NECROSIS.  - MARGINS OF THE SPECIMEN ARE NEGATIVE FOR CARCINOMA.   Comment:  Sections demonstrate abundant necrosis confined to the adrenal medullary compartment. In one block of tissue a small fragment of viable malignant neoplasm was identified. A panel of immunohistochemical stains was performed with the following pattern of immunoreactivity:  Super pancytokeratin: Positive  GATA-3: Negative  TTF-1: Negative  P40: Negative  Napsin: Negative  PAX-8: Negative  Chromogranin: Negative     Repeat CT chest abdomen and pelvis in July 2019 showed stable left lower lobe pulmonary nodule.  No other evidence of metastatic disease.  New 4.5 mm nodule in the right breast.  This was followed by ultrasound mammogram and core biopsy of the breast lesion which was a grade 3 triple negative breast cancer.  Patient was seen by Dr. Lemar Livings and underwent lumpectomy on 12/08/2017.   Patient had a 5 mm weakly ER positive tumor.  Given her ongoing fatigue Oncotype testing was done to see if she could potentially avoid chemotherapy.  Oncotype shows a recurrence score of 53 with her risk of distant recurrence at 9  years at greater than 39% with hormone therapy alone.  Absolute benefit of chemotherapy was greater than 15% in her age group.  Patient completed 4 cycles of adjuvant TC chemotherapy in December 2019    Patient has known bilateral pulmonary nodules which have been monitored and she was again found to have a left lower lobe lung nodule measuring 13 mm unchanged as compared to prior exams.  There were also new subcentimeter lung nodules detected and follow-up CT was recommended.  CT angio chest in August 2024 showed increase in the size of left upper lobe lung nodule and new multiple pulmonary nodules concerning for metastatic disease.  This was followed by a PET CT scan which showed hypermetabolic intrathoracic nodal adenopathy measuring 5 to 13 mm in size along with lung nodules with a maximum size measuring 13 mm with an SUV of 9.2.   CT-guided biopsy was nondiagnostic she is patient underwent bronchoscopy but lymph nodes were not sampled at that time due to equipment malfunction.Bronchial lavage from left upper lobe was positive for malignancy non-small cell carcinoma.  But no further subclassification whether this is primary lung cancer or metastatic carcinoma could be done due to limited specimen. EBUS station 7 LN also shows non small cell carcinoma      Interval history- Patient is 67 year old female who returns to clinic for follow up. Continues to have rash but this is unchanged. She's able to tolerate liquids but no solids or pills. She feels weak and weight is stable but down. She also complains of cough and congestion. Has new left leg  swelling over past few weeks. Tender but not painful. No fevers.   ECOG PS- 3 Pain scale- 0   Review of systems- Review of Systems  Constitutional:  Positive for malaise/fatigue. Negative for chills, fever and weight loss.  HENT:  Positive for congestion. Negative for ear discharge, nosebleeds and sore throat.   Eyes:  Negative for blurred vision.   Respiratory:  Positive for cough and sputum production. Negative for hemoptysis, shortness of breath and wheezing.   Cardiovascular:  Positive for leg swelling. Negative for chest pain, palpitations, orthopnea and claudication.  Gastrointestinal:  Negative for abdominal pain, blood in stool, constipation, diarrhea, heartburn, melena, nausea and vomiting.       Difficulty swallowing  Genitourinary:  Negative for dysuria, flank pain, frequency, hematuria and urgency.  Musculoskeletal:  Negative for back pain, falls, joint pain and myalgias.  Skin:  Positive for rash. Negative for itching.  Neurological:  Negative for dizziness, tingling, focal weakness, seizures, weakness and headaches.  Endo/Heme/Allergies:  Does not bruise/bleed easily.  Psychiatric/Behavioral:  Negative for depression and suicidal ideas. The patient does not have insomnia.      Allergies  Allergen Reactions   Sulfa Antibiotics Other (See Comments)    Mouth sore and raw    Past Medical History:  Diagnosis Date   Adenomatous colon polyp    Adrenal carcinoma, right (HCC) 12/28/2016   a.) high grade carcinoma with extensive necrosis --> s/p adrenalectomy   Anxiety    Aortic atherosclerosis (HCC)    Arthritis of right knee    Atrial fibrillation and flutter (HCC)    a.) CHA2DS2-VASc = 3 (age, sex, vascular disease history) as of 02/13/2023; b.) cardiac rate/rhythm maintained intrinsically without pharmacological intervention; no chronic OAC (does take low dose ASA)   B12 deficiency    Breast cancer (HCC) 1997   a.) stage IB (cT1a, cN0, cM0, G3, ER+, PR-, HER2-); s/p lumpectomy + XRT   COPD (chronic obstructive pulmonary disease) (HCC)    Coronary artery disease    a.) cCTA 12/09/2021: Ca2+ = 135 (80th %'ile for age/sex/race match contol) --> 25-49% pLAD and RCA distributions   Depression    Diastolic dysfunction    a.) TTE 12/11/2020, no RWMAs, norm RVSF, G1DD; b.) TTE 12/14/2021: EF 55-60, no RWMAs, norm RVSF,  G1DD; c.) TTE 01/27/2023: EF 50-55%, no RWMAs, G1DD, mild RAE, mild MR, mod TR   Dry cough 03/02/2017   Dyspnea    Full dentures    GERD (gastroesophageal reflux disease)    History of hiatal hernia    HLD (hyperlipidemia)    Invasive carcinoma of breast (HCC) 11/15/2017   a.) path (+) for G3, ER -, PR -, HER2/neu - ; s/p lumpectomy 01/17/2018 + 4 cycles TC systemic chemotherapy + XRT   Long term current use of aspirin    Personal history of chemotherapy    Personal history of radiation therapy    Pneumothorax of left lung after biopsy 01/25/2023   a.) hypoxic in PACU requiring re-intubation --> transferred to ICU --> chest tube placed by PCCM   RLS (restless legs syndrome)    a.) on ropinirole   Skin cancer    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   a.) Stage IB (cT1a, cN0, cM0, G3, ER+, PR-, HER2-)    Past Surgical History:  Procedure Laterality Date   BIOPSY  01/05/2023   Procedure: BIOPSY;  Surgeon: Toney Reil, MD;  Location: [facility];  Service: Gastroenterology;;   BREAST BIOPSY Right  1997   positive   BREAST BIOPSY Right 11/15/2017   INVASIVE MAMMARY CARCINOMA triple negative   BREAST BIOPSY Right 07/20/2020   [place] bx, Q marker, neg   BREAST LUMPECTOMY Right 1997   2019 also   BREAST LUMPECTOMY WITH SENTINEL LYMPH NODE BIOPSY Right 12/08/2017   Procedure: BREAST LUMPECTOMY WITH SENTINEL LYMPH NODE BX;  Surgeon: Earline Mayotte, MD;  Location: [facility];  Service: General;  Laterality: Right;   BREAST REDUCTION WITH MASTOPEXY Left 06/10/2019   Procedure: Left breast mastopexy reduction for symmetry;  Surgeon: Peggye Form, DO;  Location: [facility];  Service: Plastics;  Laterality: Left;  total 2.5 hours   CATARACT EXTRACTION W/PHACO Right 02/07/2022   Procedure: CATARACT EXTRACTION PHACO AND INTRAOCULAR LENS PLACEMENT (IOC) RIGHT DIABETIC;  Surgeon: Nevada Crane, MD;  Location: [facility];  Service:  Ophthalmology;  Laterality: Right;  3.60 00:34.7   CATARACT EXTRACTION W/PHACO Left 02/28/2022   Procedure: CATARACT EXTRACTION PHACO AND INTRAOCULAR LENS PLACEMENT (IOC) LEFT DIABETIC 4.11 00:26.1;  Surgeon: Nevada Crane, MD;  Location: [facility];  Service: Ophthalmology;  Laterality: Left;   COLONOSCOPY  2015   COLONOSCOPY N/A 01/05/2023   Procedure: COLONOSCOPY;  Surgeon: Toney Reil, MD;  Location: [facility] ENDOSCOPY;  Service: Gastroenterology;  Laterality: N/A;   COLONOSCOPY WITH PROPOFOL N/A 10/29/2019   Procedure: COLONOSCOPY WITH PROPOFOL;  Surgeon: Midge Minium, MD;  Location: [facility] END[facility]COPY;  Service: Endoscopy;  Laterality: N/A;   ESOPHAGOGASTRODUODENOSCOPY (EGD) WITH PROPOFOL N/A 05/24/2019   Procedure: ESOPHAGOGASTRODUODENOSCOPY (EGD) WITH PROPOFOL;  Surgeon: Midge Minium, MD;  Location: [facility] END[facility]COPY;  Service: Endoscopy;  Laterality: N/A;   ESOPHAGOGASTRODUODENOSCOPY (EGD) WITH PROPOFOL N/A 09/06/2022   Procedure: ESOPHAGOGASTRODUODENOSCOPY (EGD) WITH PROPOFOL;  Surgeon: Midge Minium, MD;  Location: [facility];  Service: Endoscopy;  Laterality: N/A;   ESOPHAGOGASTRODUODENOSCOPY (EGD) WITH PROPOFOL  01/05/2023   Procedure: ESOPHAGOGASTRODUODENOSCOPY (EGD) WITH PROPOFOL;  Surgeon: Toney Reil, MD;  Location: [facility];  Service: Gastroenterology;;   ESOPHAGUS SURGERY     IR IMAGING GUIDED PORT INSERTION  02/07/2023   KNEE SURGERY Right    LIPOSUCTION WITH LIPOFILLING Right 06/10/2019   Procedure: right breast scar and capsule release with fat grafting;  Surgeon: Peggye Form, DO;  Location: [facility];  Service: Plastics;  Laterality: Right;   POLYPECTOMY  01/05/2023   Procedure: POLYPECTOMY;  Surgeon: Toney Reil, MD;  Location: [facility] ENDOSCOPY;  Service: Gastroenterology;;   [facility] REMOVAL     PORTACATH PLACEMENT Right 01/17/2018   Procedure: INSERTION PORT-A-CATH- RIGHT;  Surgeon: Earline Mayotte, MD;   Location: [facility];  Service: General;  Laterality: Right;   ROBOTIC ADRENALECTOMY Right 12/28/2016   Procedure: ROBOTIC ADRENALECTOMY;  Surgeon: Vanna Scotland, MD;  Location: [facility];  Service: Urology;  Laterality: Right;   SKIN CANCER EXCISION     THROAT SURGERY  1998   throat cancer    TUBAL LIGATION     VENTRAL HERNIA REPAIR N/A 03/03/2017   Procedure: HERNIA REPAIR VENTRAL ADULT;  Surgeon: Ricarda Frame, MD;  Location: [facility];  Service: General;  Laterality: N/A;   VIDEO BRONCHOSCOPY WITH ENDOBRONCHIAL ULTRASOUND N/A 01/25/2023   Procedure: VIDEO BRONCHOSCOPY WITH ENDOBRONCHIAL ULTRASOUND;  Surgeon: Vida Rigger, MD;  Location: [facility];  Service: Thoracic;  Laterality: N/A;   VIDEO BRONCHOSCOPY WITH ENDOBRONCHIAL ULTRASOUND N/A 02/17/2023   Procedure: VIDEO BRONCHOSCOPY WITH ENDOBRONCHIAL ULTRASOUND;  Surgeon: Vida Rigger, MD;  Location: [facility];  Service: Thoracic;  Laterality: N/A;  Social History   Socioeconomic History   Marital status: Married    Spouse name: Calliegh, Ferra (Spouse) [phone] ([place])   Number of children: 4   Years of education: Not on file   Highest education level: 10th grade  Occupational History   Occupation: retired  Tobacco Use   Smoking status: Former    Current packs/day: 0.00    Average packs/day: 1.5 packs/day for 30.0 years (45.0 ttl pk-yrs)    Types: Cigarettes    Start date: 05/02/1965    Quit date: 05/03/1995    Years since quitting: 28.0    Passive exposure: Past   Smokeless tobacco: Never  Vaping Use   Vaping status: Never Used  Substance and Sexual Activity   Alcohol use: Not Currently    Alcohol/week: 4.0 standard drinks of alcohol    Types: 4 Shots of liquor per week    Comment: none   Drug use: No   Sexual activity: Not Currently    Birth control/protection: Post-menopausal  Other Topics Concern   Not on file  Social History Narrative   Not on file   Social Drivers of Health   Financial Resource Strain:  Low Risk  (12/02/2021)   Overall Financial Resource Strain (CARDIA)    Difficulty of Paying Living Expenses: Not very hard  Food Insecurity: No Food Insecurity (12/05/2022)   Hunger Vital Sign    Worried About Running Out of Food in the Last Year: Never true    Ran Out of Food in the Last Year: Never true  Transportation Needs: No Transportation Needs (12/05/2022)   PRAPARE - Administrator, Civil Service (Medical): No    Lack of Transportation (Non-Medical): No  Physical Activity: Insufficiently Active (12/05/2022)   Exercise Vital Sign    Days of Exercise per Week: 3 days    Minutes of Exercise per Session: 30 min  Stress: Stress Concern Present (12/05/2022)   [employer] of Occupational Health - Occupational Stress Questionnaire    Feeling of Stress : Rather much  Social Connections: Moderately Isolated (12/05/2022)   Social Connection and Isolation Panel [NHANES]    Frequency of Communication with Friends and Family: Three times a week    Frequency of Social Gatherings with Friends and Family: Twice a week    Attends Religious Services: Never    Database administrator or Organizations: No    Attends Banker Meetings: Never    Marital Status: Married  Catering manager Violence: Not At Risk (12/05/2022)   Humiliation, Afraid, Rape, and Kick questionnaire    Fear of Current or Ex-Partner: No    Emotionally Abused: No    Physically Abused: No    Sexually Abused: No    Family History  Problem Relation Age of Onset   Diabetes Mother    Heart attack Mother    Cancer Sister        cancer of eyes, spread to lung, other places. died at 104   Cancer Sister 50       unknown primary, spread to bone, brain died in 36's   Cancer Paternal Aunt        unk type   Cancer Paternal Grandmother        type unk   Prostate cancer Neg Hx    Kidney cancer Neg Hx    Bladder Cancer Neg Hx    Breast cancer Neg Hx      Current Outpatient Medications:    fentaNYL (DURAGESIC)  12 MCG/HR,  Place 1 patch onto the skin every 3 (three) days., Disp: 5 patch, Rfl: 0   fluconazole (DIFLUCAN) 10 MG/ML suspension, Take 10 mLs (100 mg total) by mouth daily., Disp: 200 mL, Rfl: 0   lidocaine (LIDODERM) 5 %, Place 1 patch onto the skin daily. Remove & Discard patch within 12 hours or as directed by MD, Disp: , Rfl:    lidocaine-prilocaine (EMLA) cream, Apply to affected area once, Disp: 30 g, Rfl: 3   omeprazole (KONVOMEP) 2 mg/mL SUSP oral suspension, Take 20 mLs (40 mg total) by mouth daily., Disp: 240 mL, Rfl: 1   ondansetron (ZOFRAN-ODT) 4 MG disintegrating tablet, Take 1 tablet (4 mg total) by mouth every 8 (eight) hours as needed for nausea or vomiting., Disp: 20 tablet, Rfl: 0   oxyCODONE (ROXICODONE) 5 MG/5ML solution, Take 5 mLs (5 mg total) by mouth every 4 (four) hours as needed for severe pain (pain score 7-10)., Disp: 200 mL, Rfl: 0   prednisoLONE (ORAPRED) 15 MG/5ML solution, Take 5 mLs (15 mg total) by mouth daily for 2 days, THEN 2.5 mLs (7.5 mg total) daily for 2 days., Disp: 15 mL, Rfl: 0   rosuvastatin (CRESTOR) 40 MG tablet, TAKE 1 TABLET BY MOUTH DAILY, Disp: 90 tablet, Rfl: 1   sucralfate (CARAFATE) 1 GM/10ML suspension, Take 10 mLs (1 g total) by mouth 4 (four) times daily -  with meals and at bedtime., Disp: 420 mL, Rfl: 0   aspirin EC 81 MG tablet, Take 1 tablet (81 mg total) by mouth daily. Swallow whole. (Patient not taking: Reported on 05/01/2023), Disp: 90 tablet, Rfl: 3   benzonatate (TESSALON) 100 MG capsule, Take 2 capsules (200 mg total) by mouth 2 (two) times daily as needed for cough. (Patient not taking: Reported on 05/01/2023), Disp: 180 capsule, Rfl: 0   buPROPion (WELLBUTRIN XL) 150 MG 24 hr tablet, Take 3 tablets (450 mg total) by mouth daily. (Patient not taking: Reported on 05/01/2023), Disp: 90 tablet, Rfl: 1   busPIRone (BUSPAR) 5 MG tablet, Take 1 tablet (5 mg total) by mouth 3 (three) times daily. (Patient not taking: Reported on 05/01/2023),  Disp: 270 tablet, Rfl: 0   cyclobenzaprine (FLEXERIL) 5 MG tablet, TAKE 1 TABLET BY MOUTH AT BEDTIME (Patient not taking: Reported on 05/01/2023), Disp: 30 tablet, Rfl: 0   dexamethasone (DECADRON) 4 MG tablet, Take 2 tablets daily for 2 days, start the day after chemotherapy. Take with food. (Patient not taking: Reported on 04/17/2023), Disp: 30 tablet, Rfl: 1   DULoxetine (CYMBALTA) 60 MG capsule, Take 1 capsule (60 mg total) by mouth 2 (two) times daily. (Patient not taking: Reported on 04/12/2023), Disp: 180 capsule, Rfl: 0   magic mouthwash w/lidocaine SOLN, Take 5 mLs by mouth 3 (three) times daily as needed for mouth pain. Suspension contains equal amounts of Maalox Extra Strength, nystatin, diphenhydramine and lidocaine. (Patient not taking: Reported on 04/17/2023), Disp: 240 mL, Rfl: 0   montelukast (SINGULAIR) 10 MG tablet, Take 1 tablet (10 mg total) by mouth at bedtime. (Patient not taking: Reported on 05/01/2023), Disp: 90 tablet, Rfl: 3   omeprazole (PRILOSEC) 20 MG capsule, Take 1 capsule (20 mg total) by mouth 2 (two) times daily before a meal. (Patient not taking: Reported on 05/01/2023), Disp: 60 capsule, Rfl: 11   ondansetron (ZOFRAN) 8 MG tablet, Take 1 tablet (8 mg total) by mouth every 8 (eight) hours as needed for nausea or vomiting. Start on the third day after chemotherapy. (Patient not taking:  Reported on 04/17/2023), Disp: 30 tablet, Rfl: 1   prochlorperazine (COMPAZINE) 10 MG tablet, Take 1 tablet (10 mg total) by mouth every 6 (six) hours as needed for nausea or vomiting. (Patient not taking: Reported on 05/01/2023), Disp: 30 tablet, Rfl: 1   rOPINIRole (REQUIP) 0.25 MG tablet, TAKE 1 TABLET BY MOUTH AT BEDTIME (Patient not taking: Reported on 04/17/2023), Disp: 90 tablet, Rfl: 1   sucralfate (CARAFATE) 1 g tablet, Take 1 tablet (1 g total) by mouth 3 (three) times daily before meals. (Patient not taking: Reported on 04/17/2023), Disp: 90 tablet, Rfl: 4   tolterodine (DETROL  LA) 4 MG 24 hr capsule, Take 1 capsule (4 mg total) by mouth daily. (Patient not taking: Reported on 04/17/2023), Disp: 30 capsule, Rfl: 11 No current facility-administered medications for this visit.  Facility-Administered Medications Ordered in Other Visits:    cyanocobalamin ((VITAMIN B-12)) injection 1,000 mcg, 1,000 mcg, Intramuscular, Q30 days, Creig Hines, MD, 1,000 mcg at 08/27/21 1537   cyanocobalamin (VITAMIN B12) injection 1,000 mcg, 1,000 mcg, Intramuscular, Q30 days, Creig Hines, MD, 1,000 mcg at 03/16/22 1101  Physical exam:  Vitals:   05/01/23 0936  BP: 113/77  Pulse: (!) 105  Temp: 98.4 F (36.9 C)  TempSrc: Tympanic  SpO2: 97%  Weight: 113 lb (51.3 kg)   Physical Exam Vitals reviewed.  Constitutional:      General: She is not in acute distress.    Comments: Thin. Frail appearing.   HENT:     Right Ear: External ear normal.     Left Ear: External ear normal.     Nose: Congestion present.     Mouth/Throat:     Mouth: Mucous membranes are moist.     Pharynx: Oropharynx is clear. No oropharyngeal exudate or posterior oropharyngeal erythema.  Eyes:     General: No scleral icterus. Cardiovascular:     Rate and Rhythm: Normal rate and regular rhythm.  Pulmonary:     Effort: Pulmonary effort is normal. No respiratory distress.     Comments: Productive cough.  Abdominal:     General: There is no distension.     Tenderness: There is no abdominal tenderness.  Musculoskeletal:     Left lower leg: Edema (2+ pitting edema. developing redness; see image.) present.  Lymphadenopathy:     Cervical: No cervical adenopathy.  Skin:    General: Skin is warm and dry.     Coloration: Skin is not pale.     Findings: Rash present.     Comments: Generalized erythematous maculopapular skin rash noted over bilateral forearms and lower extremities. No rash on back or abdomen.   Neurological:     Mental Status: She is alert and oriented to person, place, and time.   Psychiatric:        Mood and Affect: Mood normal.        Behavior: Behavior normal.          Latest Ref Rng & Units 05/01/2023    9:24 AM  CMP  Glucose 70 - 99 mg/dL 92   BUN 8 - 23 mg/dL 11   Creatinine 1.47 - 1.00 mg/dL 8.29   Sodium 562 - 130 mmol/L 136   Potassium 3.5 - 5.1 mmol/L 3.4   Chloride 98 - 111 mmol/L 99   CO2 22 - 32 mmol/L 25   Calcium 8.9 - 10.3 mg/dL 8.9   Total Protein 6.5 - 8.1 g/dL 6.2   Total Bilirubin <8.6 mg/dL 0.6   Alkaline  Phos 38 - 126 U/L 69   AST 15 - 41 U/L 27   ALT 0 - 44 U/L 17       Latest Ref Rng & Units 05/01/2023    9:24 AM  CBC  WBC 4.0 - 10.5 K/uL 5.7   Hemoglobin 12.0 - 15.0 g/dL 57.8   Hematocrit 46.9 - 46.0 % 32.8   Platelets 150 - 400 K/uL 163     Assessment and plan- Patient is a 67 y.o. female    Treatment assessment- currently s/p cycle 4 of carbo-paclitaxel. Here today for assessment prior to cycle 5. Labs reviewed and acceptable for treatment. However, patient prefers to hold treatment today. Will reconsider next week.  Non small cell carcinoma of unknown primary- predominantly with medistinal adenopathy- treated as possible NSCLC. Currently scheduled to complete radiation end of January 2025.  Cough- suspect viral though worse in past 5 days. Recommend antibiotics. Start levaquin as below.  Leg swelling- possible developing cellulitis. Low suspicion for dvt. Recommend antibiotics. Start levaquin 500 mg solution (unable to tolerate tablets) once daily x 7 days.  Skin rash- thought to be secondary to paclitaxel Radiation esophagitis- radiation and chemotherapy held x 2 weeks. No evidence of fungal component. Improved with steroids. Prednisolone extended 15 mg x 2 days then 7.5 mg x 2 days. Hold steroids.  Weight loss & frailty- Reviewed calorie replacements for nutrition. Hold fluids today as she is tolerating oral rehydration. Albumin decreased to 3.2.  Myalgias & arthralgias- Unrelated to malignancy.  Suspect low back pain  is also musculoskeletal. She is also on as needed oxycodone. Started on low-dose 12 mcg fentanyl patch as she is unable to tolerate anything orally other than liquid formulations. History of head and neck, breast cancer, adrenal carcinoma of unknown primary-   Deaccess port.  Hold treatment today. RTC in 1 week for reconsideration- la   Visit Diagnosis 1. Encounter for antineoplastic chemotherapy   2. Non-small cell cancer of left lung (HCC)   3. Skin changes related to chemotherapy   4. Radiation esophagitis   5. Productive cough   6. Suspected soft tissue infection    Consuello Masse, DNP, AGNP-C, [facility] at [facility] [phone] (clinic) 05/01/2023

## 2023-05-02 ENCOUNTER — Other Ambulatory Visit: Payer: Self-pay

## 2023-05-02 ENCOUNTER — Ambulatory Visit
Admission: RE | Admit: 2023-05-02 | Discharge: 2023-05-02 | Disposition: A | Discharge status: Home / Self Care | Payer: Medicare HMO | Source: Ambulatory Visit | Attending: Radiation Oncology | Admitting: Radiation Oncology

## 2023-05-02 ENCOUNTER — Other Ambulatory Visit: Payer: Self-pay | Admitting: *Deleted

## 2023-05-02 ENCOUNTER — Telehealth: Payer: Self-pay | Admitting: *Deleted

## 2023-05-02 DIAGNOSIS — Z5111 Encounter for antineoplastic chemotherapy: Secondary | ICD-10-CM | POA: Diagnosis not present

## 2023-05-02 DIAGNOSIS — Z853 Personal history of malignant neoplasm of breast: Secondary | ICD-10-CM | POA: Diagnosis not present

## 2023-05-02 DIAGNOSIS — C3412 Malignant neoplasm of upper lobe, left bronchus or lung: Secondary | ICD-10-CM | POA: Diagnosis not present

## 2023-05-02 DIAGNOSIS — C50511 Malignant neoplasm of lower-outer quadrant of right female breast: Secondary | ICD-10-CM | POA: Diagnosis not present

## 2023-05-02 DIAGNOSIS — K208 Other esophagitis without bleeding: Secondary | ICD-10-CM | POA: Diagnosis not present

## 2023-05-02 DIAGNOSIS — L270 Generalized skin eruption due to drugs and medicaments taken internally: Secondary | ICD-10-CM | POA: Diagnosis not present

## 2023-05-02 DIAGNOSIS — B379 Candidiasis, unspecified: Secondary | ICD-10-CM | POA: Diagnosis not present

## 2023-05-02 DIAGNOSIS — Z87891 Personal history of nicotine dependence: Secondary | ICD-10-CM | POA: Diagnosis not present

## 2023-05-02 DIAGNOSIS — Z510 Encounter for antineoplastic radiation therapy: Secondary | ICD-10-CM | POA: Diagnosis not present

## 2023-05-02 DIAGNOSIS — R12 Heartburn: Secondary | ICD-10-CM | POA: Diagnosis not present

## 2023-05-02 LAB — RAD ONC ARIA SESSION SUMMARY
Course Elapsed Days: 41
Plan Fractions Treated to Date: 19
Plan Prescribed Dose Per Fraction: 2 Gy
Plan Total Fractions Prescribed: 33
Plan Total Prescribed Dose: 66 Gy
Reference Point Dosage Given to Date: 38 Gy
Reference Point Session Dosage Given: 2 Gy
Session Number: 19

## 2023-05-02 MED ORDER — OXYCODONE HCL 5 MG/5ML PO SOLN: 5.0000 mg | ORAL | 0 refills | Status: DC | PRN

## 2023-05-02 NOTE — Telephone Encounter (Signed)
 Sent message to Josh to send in refill for pain med. Pt is having radiation.called back to pt.and let her know that she could check with pharmacy because Josh sent in the pain med

## 2023-05-04 ENCOUNTER — Ambulatory Visit
Admission: RE | Admit: 2023-05-04 | Discharge: 2023-05-04 | Disposition: A | Discharge status: Home / Self Care | Payer: Medicare HMO | Source: Ambulatory Visit | Attending: Radiation Oncology | Admitting: Radiation Oncology

## 2023-05-04 ENCOUNTER — Other Ambulatory Visit: Payer: Self-pay

## 2023-05-04 ENCOUNTER — Inpatient Hospital Stay (HOSPITAL_BASED_OUTPATIENT_CLINIC_OR_DEPARTMENT_OTHER): Payer: Medicare HMO | Admitting: Hospice and Palliative Medicine

## 2023-05-04 ENCOUNTER — Encounter: Payer: Self-pay | Admitting: Oncology

## 2023-05-04 ENCOUNTER — Ambulatory Visit
Admission: RE | Admit: 2023-05-04 | Discharge: 2023-05-04 | Disposition: A | Discharge status: Home / Self Care | Payer: Medicare HMO | Source: Ambulatory Visit | Attending: Hospice and Palliative Medicine | Admitting: Hospice and Palliative Medicine

## 2023-05-04 ENCOUNTER — Encounter: Payer: Self-pay | Admitting: Hospice and Palliative Medicine

## 2023-05-04 VITALS — BP 110/74 | HR 109 | Temp 97.4°F | Ht 61.0 in | Wt 115.6 lb

## 2023-05-04 DIAGNOSIS — D701 Agranulocytosis secondary to cancer chemotherapy: Secondary | ICD-10-CM | POA: Diagnosis not present

## 2023-05-04 DIAGNOSIS — C3412 Malignant neoplasm of upper lobe, left bronchus or lung: Secondary | ICD-10-CM | POA: Diagnosis not present

## 2023-05-04 DIAGNOSIS — Z808 Family history of malignant neoplasm of other organs or systems: Secondary | ICD-10-CM | POA: Insufficient documentation

## 2023-05-04 DIAGNOSIS — R1310 Dysphagia, unspecified: Secondary | ICD-10-CM | POA: Insufficient documentation

## 2023-05-04 DIAGNOSIS — C3492 Malignant neoplasm of unspecified part of left bronchus or lung: Secondary | ICD-10-CM | POA: Diagnosis not present

## 2023-05-04 DIAGNOSIS — Z5111 Encounter for antineoplastic chemotherapy: Secondary | ICD-10-CM | POA: Diagnosis not present

## 2023-05-04 DIAGNOSIS — L03116 Cellulitis of left lower limb: Secondary | ICD-10-CM | POA: Diagnosis not present

## 2023-05-04 DIAGNOSIS — B379 Candidiasis, unspecified: Secondary | ICD-10-CM | POA: Insufficient documentation

## 2023-05-04 DIAGNOSIS — C50511 Malignant neoplasm of lower-outer quadrant of right female breast: Secondary | ICD-10-CM | POA: Diagnosis not present

## 2023-05-04 DIAGNOSIS — Z8521 Personal history of malignant neoplasm of larynx: Secondary | ICD-10-CM | POA: Insufficient documentation

## 2023-05-04 DIAGNOSIS — F411 Generalized anxiety disorder: Secondary | ICD-10-CM | POA: Diagnosis present

## 2023-05-04 DIAGNOSIS — R609 Edema, unspecified: Secondary | ICD-10-CM | POA: Diagnosis not present

## 2023-05-04 DIAGNOSIS — Z510 Encounter for antineoplastic radiation therapy: Secondary | ICD-10-CM | POA: Diagnosis present

## 2023-05-04 DIAGNOSIS — M2550 Pain in unspecified joint: Secondary | ICD-10-CM | POA: Insufficient documentation

## 2023-05-04 DIAGNOSIS — Z882 Allergy status to sulfonamides status: Secondary | ICD-10-CM | POA: Diagnosis not present

## 2023-05-04 DIAGNOSIS — R058 Other specified cough: Secondary | ICD-10-CM | POA: Diagnosis not present

## 2023-05-04 DIAGNOSIS — Z853 Personal history of malignant neoplasm of breast: Secondary | ICD-10-CM | POA: Diagnosis not present

## 2023-05-04 DIAGNOSIS — R600 Localized edema: Secondary | ICD-10-CM | POA: Insufficient documentation

## 2023-05-04 DIAGNOSIS — R590 Localized enlarged lymph nodes: Secondary | ICD-10-CM | POA: Diagnosis not present

## 2023-05-04 DIAGNOSIS — D84821 Immunodeficiency due to drugs: Secondary | ICD-10-CM | POA: Diagnosis present

## 2023-05-04 DIAGNOSIS — M545 Low back pain, unspecified: Secondary | ICD-10-CM | POA: Insufficient documentation

## 2023-05-04 DIAGNOSIS — L270 Generalized skin eruption due to drugs and medicaments taken internally: Secondary | ICD-10-CM | POA: Insufficient documentation

## 2023-05-04 DIAGNOSIS — M791 Myalgia, unspecified site: Secondary | ICD-10-CM | POA: Diagnosis not present

## 2023-05-04 DIAGNOSIS — Z87891 Personal history of nicotine dependence: Secondary | ICD-10-CM | POA: Insufficient documentation

## 2023-05-04 DIAGNOSIS — G2581 Restless legs syndrome: Secondary | ICD-10-CM | POA: Diagnosis present

## 2023-05-04 DIAGNOSIS — Z809 Family history of malignant neoplasm, unspecified: Secondary | ICD-10-CM | POA: Insufficient documentation

## 2023-05-04 DIAGNOSIS — Z23 Encounter for immunization: Secondary | ICD-10-CM | POA: Diagnosis present

## 2023-05-04 DIAGNOSIS — Z923 Personal history of irradiation: Secondary | ICD-10-CM | POA: Diagnosis not present

## 2023-05-04 DIAGNOSIS — R918 Other nonspecific abnormal finding of lung field: Secondary | ICD-10-CM | POA: Insufficient documentation

## 2023-05-04 DIAGNOSIS — Y842 Radiological procedure and radiotherapy as the cause of abnormal reaction of the patient, or of later complication, without mention of misadventure at the time of the procedure: Secondary | ICD-10-CM | POA: Diagnosis present

## 2023-05-04 DIAGNOSIS — R12 Heartburn: Secondary | ICD-10-CM | POA: Diagnosis not present

## 2023-05-04 DIAGNOSIS — I82442 Acute embolism and thrombosis of left tibial vein: Secondary | ICD-10-CM | POA: Insufficient documentation

## 2023-05-04 DIAGNOSIS — Z7960 Long term (current) use of unspecified immunomodulators and immunosuppressants: Secondary | ICD-10-CM | POA: Diagnosis not present

## 2023-05-04 DIAGNOSIS — A419 Sepsis, unspecified organism: Secondary | ICD-10-CM | POA: Diagnosis not present

## 2023-05-04 DIAGNOSIS — K219 Gastro-esophageal reflux disease without esophagitis: Secondary | ICD-10-CM | POA: Diagnosis present

## 2023-05-04 DIAGNOSIS — S8992XA Unspecified injury of left lower leg, initial encounter: Secondary | ICD-10-CM | POA: Diagnosis not present

## 2023-05-04 DIAGNOSIS — E1122 Type 2 diabetes mellitus with diabetic chronic kidney disease: Secondary | ICD-10-CM | POA: Diagnosis present

## 2023-05-04 DIAGNOSIS — K208 Other esophagitis without bleeding: Secondary | ICD-10-CM | POA: Diagnosis not present

## 2023-05-04 DIAGNOSIS — I4892 Unspecified atrial flutter: Secondary | ICD-10-CM | POA: Diagnosis present

## 2023-05-04 DIAGNOSIS — Z86711 Personal history of pulmonary embolism: Secondary | ICD-10-CM | POA: Insufficient documentation

## 2023-05-04 DIAGNOSIS — I2510 Atherosclerotic heart disease of native coronary artery without angina pectoris: Secondary | ICD-10-CM | POA: Diagnosis present

## 2023-05-04 DIAGNOSIS — I480 Paroxysmal atrial fibrillation: Secondary | ICD-10-CM | POA: Diagnosis present

## 2023-05-04 DIAGNOSIS — E785 Hyperlipidemia, unspecified: Secondary | ICD-10-CM | POA: Diagnosis present

## 2023-05-04 DIAGNOSIS — Z5189 Encounter for other specified aftercare: Secondary | ICD-10-CM | POA: Diagnosis not present

## 2023-05-04 DIAGNOSIS — J449 Chronic obstructive pulmonary disease, unspecified: Secondary | ICD-10-CM | POA: Diagnosis present

## 2023-05-04 DIAGNOSIS — G893 Neoplasm related pain (acute) (chronic): Secondary | ICD-10-CM | POA: Insufficient documentation

## 2023-05-04 DIAGNOSIS — Z7982 Long term (current) use of aspirin: Secondary | ICD-10-CM | POA: Diagnosis not present

## 2023-05-04 DIAGNOSIS — I25118 Atherosclerotic heart disease of native coronary artery with other forms of angina pectoris: Secondary | ICD-10-CM | POA: Diagnosis present

## 2023-05-04 DIAGNOSIS — E43 Unspecified severe protein-calorie malnutrition: Secondary | ICD-10-CM | POA: Insufficient documentation

## 2023-05-04 DIAGNOSIS — I824Z3 Acute embolism and thrombosis of unspecified deep veins of distal lower extremity, bilateral: Secondary | ICD-10-CM | POA: Diagnosis present

## 2023-05-04 DIAGNOSIS — N182 Chronic kidney disease, stage 2 (mild): Secondary | ICD-10-CM | POA: Diagnosis present

## 2023-05-04 DIAGNOSIS — C3490 Malignant neoplasm of unspecified part of unspecified bronchus or lung: Secondary | ICD-10-CM | POA: Diagnosis present

## 2023-05-04 DIAGNOSIS — L03115 Cellulitis of right lower limb: Secondary | ICD-10-CM | POA: Diagnosis present

## 2023-05-04 DIAGNOSIS — T66XXXD Radiation sickness, unspecified, subsequent encounter: Secondary | ICD-10-CM | POA: Diagnosis not present

## 2023-05-04 DIAGNOSIS — L0390 Cellulitis, unspecified: Secondary | ICD-10-CM | POA: Diagnosis not present

## 2023-05-04 DIAGNOSIS — I82461 Acute embolism and thrombosis of right calf muscular vein: Secondary | ICD-10-CM | POA: Diagnosis not present

## 2023-05-04 DIAGNOSIS — T451X5A Adverse effect of antineoplastic and immunosuppressive drugs, initial encounter: Secondary | ICD-10-CM | POA: Diagnosis present

## 2023-05-04 LAB — RAD ONC ARIA SESSION SUMMARY
Course Elapsed Days: 43
Plan Fractions Treated to Date: 20
Plan Prescribed Dose Per Fraction: 2 Gy
Plan Total Fractions Prescribed: 33
Plan Total Prescribed Dose: 66 Gy
Reference Point Dosage Given to Date: 40 Gy
Reference Point Session Dosage Given: 2 Gy
Session Number: 20

## 2023-05-04 MED ORDER — APIXABAN (ELIQUIS) VTE STARTER PACK (10MG AND 5MG): ORAL_TABLET | ORAL | 0 refills | Status: DC

## 2023-05-04 NOTE — Progress Notes (Addendum)
 Symptom Management Clinic [facility] at [facility] [phone] [phone]  Patient Care Team: Emilio Kelly DASEN, FNP as PCP - General (Family Medicine) Darliss Rogue, MD as PCP - Cardiology (Cardiology) Melanee Annah BROCKS, MD as Consulting Physician (Oncology) Jordis Laneta FALCON, MD as Consulting Physician (General Surgery) Dasher, Alm LABOR, MD (Dermatology) Scheeler, Donnice PARAS, PA-C as Physician Assistant (Plastic Surgery) Dorothyann Drivers, MD as Attending Physician (Emergency Medicine) Ferrel, Lionel Sotero RIGGERS (Neurology) French Mering, MD as Referring Physician (Internal Medicine) Shellia Oh, MD (Inactive) as Consulting Physician (Pulmonary Disease) Sandria Selma LABOR, RPH (Inactive) (Pharmacist) Pa, [facility] (Optometry) Verdene Gills, RN as Oncology Nurse Navigator Lenn Aran, MD as Consulting Physician (Radiation Oncology)   NAME OF PATIENT: Denise Watts  [id number]  [birth year]   DATE OF VISIT: 05/04/23  REASON FOR CONSULT: Denise Watts is a 68 y.o. 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   INTERVAL HISTORY: Patient was seen on 05/01/2023 with complaint of left lower extremity edema.  Patient was started on Levaquin  for possible cellulitis.  Cycle 5 carbo paclitaxel  chemotherapy was held.  Patient presents back to [facility] today for evaluation of worsening left lower extremity edema.  She has been taking the Levaquin  but has not found any improvement.  She reports pain in the calf with redness and then lower leg/foot.  Denies shortness of breath or chest pain.  Denies any neurologic complaints. Denies recent fevers or illnesses. Denies any easy bleeding or bruising. Denies urinary complaints. Patient offers no  further specific complaints today.   PAST MEDICAL HISTORY: Past Medical History:  Diagnosis Date   Adenomatous colon polyp    Adrenal carcinoma, right (HCC) 12/28/2016   a.) high grade carcinoma with extensive necrosis --> s/p adrenalectomy   Anxiety    Aortic atherosclerosis (HCC)    Arthritis of right knee    Atrial fibrillation and flutter (HCC)    a.) CHA2DS2-VASc = 3 (age, sex, vascular disease history) as of 02/13/2023; b.) cardiac rate/rhythm maintained intrinsically without pharmacological intervention; no chronic OAC (does take low dose ASA)   B12 deficiency    Breast cancer (HCC) 1997   a.) stage IB (cT1a, cN0, cM0, G3, ER+, PR-, HER2-); s/p lumpectomy + XRT   COPD (chronic obstructive pulmonary disease) (HCC)    Coronary artery disease    a.) cCTA 12/09/2021: Ca2+ = 135 (80th %'ile for age/sex/race match contol) --> 25-49% pLAD and RCA distributions   Depression    Diastolic dysfunction    a.) TTE 12/11/2020, no RWMAs, norm RVSF, G1DD; b.) TTE 12/14/2021: EF 55-60, no RWMAs, norm RVSF, G1DD; c.) TTE 01/27/2023: EF 50-55%, no RWMAs, G1DD, mild RAE, mild MR, mod TR   Dry cough 03/02/2017   Dyspnea    Full dentures    GERD (gastroesophageal reflux disease)    History of hiatal hernia    HLD (hyperlipidemia)    Invasive carcinoma of breast (HCC) 11/15/2017   a.) path (+) for G3, ER -, PR -, HER2/neu - ; s/p lumpectomy 01/17/2018 + 4 cycles TC systemic chemotherapy + XRT   Long term current use of aspirin     Personal history of chemotherapy    Personal history of radiation therapy    Pneumothorax of left lung after biopsy 01/25/2023   a.) hypoxic in PACU requiring re-intubation -->  transferred to ICU --> chest tube placed by PCCM   RLS (restless legs syndrome)    a.) on ropinirole    Skin cancer    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   a.) Stage IB (cT1a, cN0, cM0, G3, ER+, PR-, HER2-)    PAST SURGICAL HISTORY:  Past Surgical History:  Procedure  Laterality Date   BIOPSY  01/05/2023   Procedure: BIOPSY;  Surgeon: Unk Corinn Skiff, MD;  Location: [facility];  Service: Gastroenterology;;   BREAST BIOPSY Right 1997   positive   BREAST BIOPSY Right 11/15/2017   INVASIVE MAMMARY CARCINOMA triple negative   BREAST BIOPSY Right 07/20/2020   us  bx, Q marker, neg   BREAST LUMPECTOMY Right 1997   2019 also   BREAST LUMPECTOMY WITH SENTINEL LYMPH NODE BIOPSY Right 12/08/2017   Procedure: BREAST LUMPECTOMY WITH SENTINEL LYMPH NODE BX;  Surgeon: Dessa Reyes ORN, MD;  Location: [facility];  Service: General;  Laterality: Right;   BREAST REDUCTION WITH MASTOPEXY Left 06/10/2019   Procedure: Left breast mastopexy reduction for symmetry;  Surgeon: Lowery Estefana RAMAN, DO;  Location: [facility];  Service: Plastics;  Laterality: Left;  total 2.5 hours   CATARACT EXTRACTION W/PHACO Right 02/07/2022   Procedure: CATARACT EXTRACTION PHACO AND INTRAOCULAR LENS PLACEMENT (IOC) RIGHT DIABETIC;  Surgeon: Myrna Adine Anes, MD;  Location: [facility];  Service: Ophthalmology;  Laterality: Right;  3.60 00:34.7   CATARACT EXTRACTION W/PHACO Left 02/28/2022   Procedure: CATARACT EXTRACTION PHACO AND INTRAOCULAR LENS PLACEMENT (IOC) LEFT DIABETIC 4.11 00:26.1;  Surgeon: Myrna Adine Anes, MD;  Location: [facility];  Service: Ophthalmology;  Laterality: Left;   COLONOSCOPY  2015   COLONOSCOPY N/A 01/05/2023   Procedure: COLONOSCOPY;  Surgeon: Unk Corinn Skiff, MD;  Location: [facility];  Service: Gastroenterology;  Laterality: N/A;   COLONOSCOPY WITH PROPOFOL  N/A 10/29/2019   Procedure: COLONOSCOPY WITH PROPOFOL ;  Surgeon: Jinny Carmine, MD;  Location: [facility];  Service: Endoscopy;  Laterality: N/A;   ESOPHAGOGASTRODUODENOSCOPY (EGD) WITH PROPOFOL  N/A 05/24/2019   Procedure: ESOPHAGOGASTRODUODENOSCOPY (EGD) WITH PROPOFOL ;  Surgeon: Jinny Carmine, MD;  Location: [facility];  Service: Endoscopy;  Laterality: N/A;    ESOPHAGOGASTRODUODENOSCOPY (EGD) WITH PROPOFOL  N/A 09/06/2022   Procedure: ESOPHAGOGASTRODUODENOSCOPY (EGD) WITH PROPOFOL ;  Surgeon: Jinny Carmine, MD;  Location: [facility];  Service: Endoscopy;  Laterality: N/A;   ESOPHAGOGASTRODUODENOSCOPY (EGD) WITH PROPOFOL   01/05/2023   Procedure: ESOPHAGOGASTRODUODENOSCOPY (EGD) WITH PROPOFOL ;  Surgeon: Unk Corinn Skiff, MD;  Location: [facility];  Service: Gastroenterology;;   ESOPHAGUS SURGERY     IR IMAGING GUIDED PORT INSERTION  02/07/2023   KNEE SURGERY Right    LIPOSUCTION WITH LIPOFILLING Right 06/10/2019   Procedure: right breast scar and capsule release with fat grafting;  Surgeon: Lowery Estefana RAMAN, DO;  Location: [facility];  Service: Plastics;  Laterality: Right;   POLYPECTOMY  01/05/2023   Procedure: POLYPECTOMY;  Surgeon: Unk Corinn Skiff, MD;  Location: [facility];  Service: Gastroenterology;;   [facility] REMOVAL     PORTACATH PLACEMENT Right 01/17/2018   Procedure: INSERTION PORT-A-CATH- RIGHT;  Surgeon: Dessa Reyes ORN, MD;  Location: [facility];  Service: General;  Laterality: Right;   ROBOTIC ADRENALECTOMY Right 12/28/2016   Procedure: ROBOTIC ADRENALECTOMY;  Surgeon: Penne Knee, MD;  Location: [facility];  Service: Urology;  Laterality: Right;   SKIN CANCER EXCISION     THROAT SURGERY  1998   throat cancer    TUBAL LIGATION     VENTRAL HERNIA  REPAIR N/A 03/03/2017   Procedure: HERNIA REPAIR VENTRAL ADULT;  Surgeon: Shelva Dunnings, MD;  Location: [facility];  Service: General;  Laterality: N/A;   VIDEO BRONCHOSCOPY WITH ENDOBRONCHIAL ULTRASOUND N/A 01/25/2023   Procedure: VIDEO BRONCHOSCOPY WITH ENDOBRONCHIAL ULTRASOUND;  Surgeon: Parris Manna, MD;  Location: [facility];  Service: Thoracic;  Laterality: N/A;   VIDEO BRONCHOSCOPY WITH ENDOBRONCHIAL ULTRASOUND N/A 02/17/2023   Procedure: VIDEO BRONCHOSCOPY WITH ENDOBRONCHIAL ULTRASOUND;  Surgeon: Parris Manna, MD;  Location: [facility];  Service:  Thoracic;  Laterality: N/A;    HEMATOLOGY/ONCOLOGY HISTORY:  Oncology History Overview Note  1. Carcinoma of breast, T1, N0, M0 tumor diagnosis.  In January of 1997 2. Carcinoma of the vocal cord T1, N0, M0 tumor.  Status postradiation therapy 3. Abnormal CT scan of the chest (December, 2015) 4. Repeat CT scan of chest shows stable nodule (July, 2016) 5. Neutropenia with normal hemoglobin and normal platelet count (July, 2016)   Cancer of vocal cord [facility]) (Resolved)  11/10/2014 Initial Diagnosis   Cancer of vocal cord   Malignant neoplasm of lower-outer quadrant of right breast of female, estrogen receptor negative (HCC)  11/24/2017 Initial Diagnosis   Malignant neoplasm of lower-outer quadrant of right breast of female, estrogen receptor negative (HCC)   01/12/2018 Cancer Staging   Staging form: Breast, AJCC 8th Edition - Clinical stage from 01/12/2018: Stage IB (cT1a, cN0, cM0, G3, ER+, PR-, HER2-) - Signed by Melanee Annah BROCKS, MD on 01/12/2018   01/30/2018 - 04/26/2018 Chemotherapy   The patient had dexamethasone  (DECADRON ) 4 MG tablet, 8 mg, Oral, 2 times daily, 1 of 1 cycle, Start date: 01/12/2018, End date: 08/03/2018 palonosetron  (ALOXI ) injection 0.25 mg, 0.25 mg, Intravenous,  Once, 4 of 4 cycles Administration: 0.25 mg (01/30/2018), 0.25 mg (02/27/2018), 0.25 mg (03/28/2018), 0.25 mg (04/26/2018) pegfilgrastim  (NEULASTA  ONPRO KIT) injection 6 mg, 6 mg, Subcutaneous, Once, 4 of 4 cycles Administration: 6 mg (01/30/2018), 6 mg (02/27/2018), 6 mg (03/28/2018), 6 mg (04/26/2018) cyclophosphamide  (CYTOXAN ) 1,000 mg in sodium chloride  0.9 % 250 mL chemo infusion, 600 mg/m2 = 1,000 mg, Intravenous,  Once, 4 of 4 cycles Administration: 1,000 mg (01/30/2018), 1,000 mg (02/27/2018), 1,000 mg (03/28/2018), 1,000 mg (04/26/2018) DOCEtaxel  (TAXOTERE ) 130 mg in sodium chloride  0.9 % 250 mL chemo infusion, 75 mg/m2 = 130 mg, Intravenous,  Once, 4 of 4 cycles Dose modification: 60 mg/m2 (original dose 75  mg/m2, Cycle 2, Reason: Dose not tolerated) Administration: 130 mg (01/30/2018), 100 mg (02/27/2018), 100 mg (03/28/2018), 100 mg (04/26/2018)  for chemotherapy treatment.    Non-small cell lung cancer (HCC)  03/09/2023 Initial Diagnosis   Non-small cell lung cancer (HCC)   03/20/2023 -  Chemotherapy   Patient is on Treatment Plan : LUNG Carboplatin  + Paclitaxel  q7d       ALLERGIES:  is allergic to sulfa  antibiotics.  MEDICATIONS:  Current Outpatient Medications  Medication Sig Dispense Refill   benzonatate  (TESSALON ) 100 MG capsule Take 2 capsules (200 mg total) by mouth 2 (two) times daily as needed for cough. 180 capsule 0   fentaNYL  (DURAGESIC ) 12 MCG/HR Place 1 patch onto the skin every 3 (three) days. 5 patch 0   fluconazole  (DIFLUCAN ) 10 MG/ML suspension TAKE 10 ML (100 MG TOTAL) BY MOUTH DAILY 175 mL 0   levofloxacin  (LEVAQUIN ) 25 MG/ML solution Take 20 mLs (500 mg total) by mouth daily for 7 days. 140 mL 0   lidocaine  (LIDODERM ) 5 % Place 1 patch onto the skin daily. Remove & Discard patch within 12 hours  or as directed by MD     lidocaine -prilocaine  (EMLA ) cream Apply to affected area once 30 g 3   omeprazole  (KONVOMEP ) 2 mg/mL SUSP oral suspension Take 20 mLs (40 mg total) by mouth daily. 240 mL 1   ondansetron  (ZOFRAN -ODT) 4 MG disintegrating tablet Take 1 tablet (4 mg total) by mouth every 8 (eight) hours as needed for nausea or vomiting. 20 tablet 0   oxyCODONE  (ROXICODONE ) 5 MG/5ML solution Take 5 mLs (5 mg total) by mouth every 4 (four) hours as needed for severe pain (pain score 7-10). 200 mL 0   buPROPion  (WELLBUTRIN  XL) 150 MG 24 hr tablet Take 3 tablets (450 mg total) by mouth daily. (Patient not taking: Reported on 04/12/2023) 90 tablet 1   busPIRone  (BUSPAR ) 5 MG tablet Take 1 tablet (5 mg total) by mouth 3 (three) times daily. (Patient not taking: Reported on 04/12/2023) 270 tablet 0   cyclobenzaprine  (FLEXERIL ) 5 MG tablet TAKE 1 TABLET BY MOUTH AT BEDTIME (Patient  not taking: Reported on 04/12/2023) 30 tablet 0   dexamethasone  (DECADRON ) 4 MG tablet Take 2 tablets daily for 2 days, start the day after chemotherapy. Take with food. (Patient not taking: Reported on 05/04/2023) 30 tablet 1   DULoxetine  (CYMBALTA ) 60 MG capsule Take 1 capsule (60 mg total) by mouth 2 (two) times daily. (Patient not taking: Reported on 04/12/2023) 180 capsule 0   magic mouthwash w/lidocaine  SOLN Take 5 mLs by mouth 3 (three) times daily as needed for mouth pain. Suspension contains equal amounts of Maalox Extra Strength, nystatin , diphenhydramine  and lidocaine . (Patient not taking: Reported on 05/04/2023) 240 mL 0   montelukast  (SINGULAIR ) 10 MG tablet Take 1 tablet (10 mg total) by mouth at bedtime. (Patient not taking: Reported on 04/12/2023) 90 tablet 3   omeprazole  (PRILOSEC) 20 MG capsule Take 1 capsule (20 mg total) by mouth 2 (two) times daily before a meal. (Patient not taking: Reported on 04/12/2023) 60 capsule 11   ondansetron  (ZOFRAN ) 8 MG tablet Take 1 tablet (8 mg total) by mouth every 8 (eight) hours as needed for nausea or vomiting. Start on the third day after chemotherapy. (Patient not taking: Reported on 05/04/2023) 30 tablet 1   prochlorperazine  (COMPAZINE ) 10 MG tablet Take 1 tablet (10 mg total) by mouth every 6 (six) hours as needed for nausea or vomiting. (Patient not taking: Reported on 04/12/2023) 30 tablet 1   rOPINIRole  (REQUIP ) 0.25 MG tablet TAKE 1 TABLET BY MOUTH AT BEDTIME (Patient not taking: Reported on 05/04/2023) 90 tablet 1   rosuvastatin  (CRESTOR ) 40 MG tablet TAKE 1 TABLET BY MOUTH DAILY 90 tablet 1   sucralfate  (CARAFATE ) 1 g tablet Take 1 tablet (1 g total) by mouth 3 (three) times daily before meals. (Patient not taking: Reported on 04/17/2023) 90 tablet 4   sucralfate  (CARAFATE ) 1 GM/10ML suspension Take 10 mLs (1 g total) by mouth 4 (four) times daily -  with meals and at bedtime. 420 mL 0   tolterodine  (DETROL  LA) 4 MG 24 hr capsule Take 1 capsule (4  mg total) by mouth daily. (Patient not taking: Reported on 04/17/2023) 30 capsule 11   No current facility-administered medications for this visit.   Facility-Administered Medications Ordered in Other Visits  Medication Dose Route Frequency Provider Last Rate Last Admin   cyanocobalamin  ((VITAMIN B-12)) injection 1,000 mcg  1,000 mcg Intramuscular Q30 days Rao, Archana C, MD   1,000 mcg at 08/27/21 1537   cyanocobalamin  (VITAMIN B12) injection 1,000 mcg  1,000 mcg Intramuscular Q30 days Rao, Archana C, MD   1,000 mcg at 03/16/22 1101    VITAL SIGNS: BP 110/74 (BP Location: Left Arm, Patient Position: Sitting, Cuff Size: Normal)   Pulse (!) 109   Temp (!) 97.4 F (36.3 C) (Tympanic)   Ht 5' 1 (1.549 m)   Wt 115 lb 9.6 oz (52.4 kg)   SpO2 100%   BMI 21.84 kg/m  Filed Weights   05/04/23 1001  Weight: 115 lb 9.6 oz (52.4 kg)    Estimated body mass index is 21.84 kg/m as calculated from the following:   Height as of this encounter: 5' 1 (1.549 m).   Weight as of this encounter: 115 lb 9.6 oz (52.4 kg).  LABS: CBC:    Component Value Date/Time   WBC 5.7 05/01/2023 0924   WBC 7.5 01/28/2023 0407   HGB 10.9 (L) 05/01/2023 0924   HGB 14.0 11/03/2021 1551   HCT 32.8 (L) 05/01/2023 0924   HCT 42.1 11/03/2021 1551   PLT 163 05/01/2023 0924   PLT 216 11/03/2021 1551   MCV 97.9 05/01/2023 0924   MCV 97 11/03/2021 1551   MCV 100 04/08/2014 0409   NEUTROABS 4.4 05/01/2023 0924   NEUTROABS 3.5 11/03/2021 1551   NEUTROABS 6.1 04/08/2014 0409   LYMPHSABS 0.6 (L) 05/01/2023 0924   LYMPHSABS 1.2 11/03/2021 1551   LYMPHSABS 0.8 (L) 04/08/2014 0409   MONOABS 0.4 05/01/2023 0924   MONOABS 0.7 04/08/2014 0409   EOSABS 0.2 05/01/2023 0924   EOSABS 0.2 11/03/2021 1551   EOSABS 0.0 04/08/2014 0409   BASOSABS 0.0 05/01/2023 0924   BASOSABS 0.0 11/03/2021 1551   BASOSABS 0.0 04/08/2014 0409   Comprehensive Metabolic Panel:    Component Value Date/Time   NA 136 05/01/2023 0924   NA  145 (H) 11/03/2021 1551   NA 143 04/08/2014 0409   K 3.4 (L) 05/01/2023 0924   K 3.5 04/08/2014 0409   CL 99 05/01/2023 0924   CL 113 (H) 04/08/2014 0409   CO2 25 05/01/2023 0924   CO2 26 04/08/2014 0409   BUN 11 05/01/2023 0924   BUN 13 11/03/2021 1551   BUN 6 (L) 04/08/2014 0409   CREATININE 0.68 05/01/2023 0924   CREATININE 0.67 04/08/2014 0409   GLUCOSE 92 05/01/2023 0924   GLUCOSE 84 04/08/2014 0409   CALCIUM  8.9 05/01/2023 0924   CALCIUM  7.5 (L) 04/08/2014 0409   AST 27 05/01/2023 0924   ALT 17 05/01/2023 0924   ALT 21 11/26/2012 1019   ALKPHOS 69 05/01/2023 0924   ALKPHOS 87 11/26/2012 1019   BILITOT 0.6 05/01/2023 0924   PROT 6.2 (L) 05/01/2023 0924   PROT 6.3 11/03/2021 1551   PROT 6.4 11/26/2012 1019   ALBUMIN  3.2 (L) 05/01/2023 0924   ALBUMIN  4.1 11/03/2021 1551   ALBUMIN  3.5 11/26/2012 1019    RADIOGRAPHIC STUDIES: No results found.  PERFORMANCE STATUS (ECOG) : 1 - Symptomatic but completely ambulatory  Review of Systems Unless otherwise noted, a complete review of systems is negative.  Physical Exam General: NAD Cardiovascular: regular rate and rhythm Pulmonary: clear ant fields Abdomen: soft, nontender, + bowel sounds GU: no suprapubic tenderness Extremities: Left lower extremity edema/erythema, no joint deformities Skin: no rashes Neurological: Weakness but otherwise nonfocal    IMPRESSION/PLAN: NSCLC - on weekly carbo taxol  chemotherapy and concurrent XRT  Left lower extremity edema -patient on Levaquin , which should provide adequate coverage for cellulitis.  Will send for stat ultrasound to rule out possible  DVT.  ED triggers reviewed in detail with patient.  Addendum: Ultrasound positive for small DVT of the posterior tibial vein.  Will start patient on Eliquis .  Instructions given to patient and she verbalized understanding.  ED triggers reviewed in detail.  Follow-up as scheduled next week.  Case and plan discussed Dr. Jacobo, who was  covering for Dr. Melanee.  Patient expressed understanding and was in agreement with this plan. She also understands that She can call clinic at any time with any questions, concerns, or complaints.   Thank you for allowing me to participate in the care of this very pleasant patient.   Time Total: 15 minutes  Visit consisted of counseling and education dealing with the complex and emotionally intense issues of symptom management in the setting of serious illness.Greater than 50%  of this time was spent counseling and coordinating care related to the above assessment and plan.  Signed by: Fonda Mower, PhD, NP-C

## 2023-05-04 NOTE — Progress Notes (Signed)
 C/o left leg and feet swelling x1 week. C/o pain in her foot, leg and back 6/10.  C/o generalized skin cracking.  No appetite, fair life daily.  Constipation, miralax not helping. 2 Bms in a week.

## 2023-05-04 NOTE — Addendum Note (Signed)
 Addended by: Laurette Schimke R on: 05/04/2023 12:22 PM   Modules accepted: Orders

## 2023-05-04 NOTE — Addendum Note (Signed)
 Addended by: Laurette Schimke R on: 05/04/2023 11:02 AM   Modules accepted: Orders

## 2023-05-05 ENCOUNTER — Inpatient Hospital Stay: Payer: Medicare HMO

## 2023-05-05 ENCOUNTER — Ambulatory Visit
Admission: RE | Admit: 2023-05-05 | Discharge: 2023-05-05 | Disposition: A | Discharge status: Home / Self Care | Payer: Medicare HMO | Source: Ambulatory Visit | Attending: Radiation Oncology | Admitting: Radiation Oncology

## 2023-05-05 ENCOUNTER — Other Ambulatory Visit: Payer: Self-pay

## 2023-05-05 DIAGNOSIS — Z853 Personal history of malignant neoplasm of breast: Secondary | ICD-10-CM | POA: Diagnosis not present

## 2023-05-05 DIAGNOSIS — C50511 Malignant neoplasm of lower-outer quadrant of right female breast: Secondary | ICD-10-CM | POA: Diagnosis not present

## 2023-05-05 DIAGNOSIS — K208 Other esophagitis without bleeding: Secondary | ICD-10-CM | POA: Diagnosis not present

## 2023-05-05 DIAGNOSIS — Z87891 Personal history of nicotine dependence: Secondary | ICD-10-CM | POA: Diagnosis not present

## 2023-05-05 DIAGNOSIS — Z510 Encounter for antineoplastic radiation therapy: Secondary | ICD-10-CM | POA: Diagnosis not present

## 2023-05-05 DIAGNOSIS — R12 Heartburn: Secondary | ICD-10-CM | POA: Diagnosis not present

## 2023-05-05 DIAGNOSIS — B379 Candidiasis, unspecified: Secondary | ICD-10-CM | POA: Diagnosis not present

## 2023-05-05 DIAGNOSIS — Z5111 Encounter for antineoplastic chemotherapy: Secondary | ICD-10-CM | POA: Diagnosis not present

## 2023-05-05 DIAGNOSIS — L270 Generalized skin eruption due to drugs and medicaments taken internally: Secondary | ICD-10-CM | POA: Diagnosis not present

## 2023-05-05 DIAGNOSIS — C3412 Malignant neoplasm of upper lobe, left bronchus or lung: Secondary | ICD-10-CM | POA: Diagnosis not present

## 2023-05-05 LAB — RAD ONC ARIA SESSION SUMMARY
Course Elapsed Days: 44
Plan Fractions Treated to Date: 21
Plan Prescribed Dose Per Fraction: 2 Gy
Plan Total Fractions Prescribed: 33
Plan Total Prescribed Dose: 66 Gy
Reference Point Dosage Given to Date: 42 Gy
Reference Point Session Dosage Given: 2 Gy
Session Number: 21

## 2023-05-05 NOTE — Progress Notes (Signed)
 Nutrition Assessment   Reason for Assessment:  Poor po  intake   ASSESSMENT:  68 year old female with non small cell lung cancer.  Past medical history of CAD, GERD, HLD, afib, COPD, Vit B 12 deficiency.  Patient receiving radiation.   Met with patient after radiation today.  Reports poor po intake for > month.  Ate 1/2 hot dog yesterday and drank chocolate milkshake and had some potatoes.  Drinking Fairlife shake.  Does not like ensure/boost shakes as too sweet.  Noted being treated for DVT in left lower extremity.      Nutrition Focused Physical Exam:   Orbital Region: mild Buccal Region: moderate Upper Arm Region: WNL Thoracic and Lumbar Region: WNL Temple Region: moderate Clavicle Bone Region: mild Shoulder and Acromion Bone Region: mild Scapular Bone Region: mild Dorsal Hand: moderate Patellar Region: normal Anterior Thigh Region: normal Posterior Calf Region: normal Edema (RD assessment): present on left, DVT    Medications: zofran , carafate , omeprezole   Labs: K 3.4   Anthropometrics:   Height: 61 inches Weight: 115  9.6 oz  126 lb on 11/18 138 lb on 9/28  BMI: 21  17% weight loss in the last 3 months, signifiant   Estimated Energy Needs  Kcals: 1300-1560 Protein: 65-78 g Fluid: 1300-1560 ml   NUTRITION DIAGNOSIS: Inadequate oral intake related to cancer and related treatment side effects as evidenced by 17% weight loss in the last 3 months, eating less than 75% of estimated energy needs and mild moderate fat loss and muscle mass loss.     MALNUTRITION DIAGNOSIS: Patient meets criteria for severe malnutrition as evidened by 17% weight loss in the last 3 months and eating less than 75% of estimated energy needs   INTERVENTION:  Discussed High Calorie, High Protein Diet. Handout provided Encouraged increase to 2-3 Fairlife shakes per day Encouraged eating q 2 hours Contact information given   MONITORING, EVALUATION, GOAL: weight trends,  intake   Next Visit: Wednesday, Jan 15 after radiation  Dream Harman B. Dasie, RD, LDN Registered Dietitian [phone]

## 2023-05-06 ENCOUNTER — Emergency Department: Payer: Medicare HMO

## 2023-05-06 ENCOUNTER — Inpatient Hospital Stay
Admission: EM | Admit: 2023-05-06 | Discharge: 2023-05-08 | DRG: 300 | Disposition: A | Discharge status: Home / Self Care | Payer: Medicare HMO | Attending: Obstetrics and Gynecology | Admitting: Obstetrics and Gynecology

## 2023-05-06 ENCOUNTER — Encounter: Payer: Self-pay | Admitting: Emergency Medicine

## 2023-05-06 ENCOUNTER — Other Ambulatory Visit: Payer: Self-pay

## 2023-05-06 DIAGNOSIS — Z7901 Long term (current) use of anticoagulants: Secondary | ICD-10-CM

## 2023-05-06 DIAGNOSIS — L03116 Cellulitis of left lower limb: Principal | ICD-10-CM

## 2023-05-06 DIAGNOSIS — E1122 Type 2 diabetes mellitus with diabetic chronic kidney disease: Secondary | ICD-10-CM | POA: Diagnosis present

## 2023-05-06 DIAGNOSIS — Z171 Estrogen receptor negative status [ER-]: Secondary | ICD-10-CM

## 2023-05-06 DIAGNOSIS — C3490 Malignant neoplasm of unspecified part of unspecified bronchus or lung: Secondary | ICD-10-CM | POA: Diagnosis present

## 2023-05-06 DIAGNOSIS — T66XXXD Radiation sickness, unspecified, subsequent encounter: Secondary | ICD-10-CM

## 2023-05-06 DIAGNOSIS — Z7960 Long term (current) use of unspecified immunomodulators and immunosuppressants: Secondary | ICD-10-CM

## 2023-05-06 DIAGNOSIS — Z87891 Personal history of nicotine dependence: Secondary | ICD-10-CM

## 2023-05-06 DIAGNOSIS — S8992XA Unspecified injury of left lower leg, initial encounter: Secondary | ICD-10-CM | POA: Diagnosis not present

## 2023-05-06 DIAGNOSIS — I824Z3 Acute embolism and thrombosis of unspecified deep veins of distal lower extremity, bilateral: Secondary | ICD-10-CM | POA: Diagnosis present

## 2023-05-06 DIAGNOSIS — G893 Neoplasm related pain (acute) (chronic): Secondary | ICD-10-CM | POA: Diagnosis present

## 2023-05-06 DIAGNOSIS — Z1722 Progesterone receptor negative status: Secondary | ICD-10-CM

## 2023-05-06 DIAGNOSIS — L270 Generalized skin eruption due to drugs and medicaments taken internally: Secondary | ICD-10-CM | POA: Diagnosis present

## 2023-05-06 DIAGNOSIS — A419 Sepsis, unspecified organism: Secondary | ICD-10-CM

## 2023-05-06 DIAGNOSIS — Z923 Personal history of irradiation: Secondary | ICD-10-CM

## 2023-05-06 DIAGNOSIS — E785 Hyperlipidemia, unspecified: Secondary | ICD-10-CM | POA: Diagnosis present

## 2023-05-06 DIAGNOSIS — Z85828 Personal history of other malignant neoplasm of skin: Secondary | ICD-10-CM

## 2023-05-06 DIAGNOSIS — Z1732 Human epidermal growth factor receptor 2 negative status: Secondary | ICD-10-CM

## 2023-05-06 DIAGNOSIS — G2581 Restless legs syndrome: Secondary | ICD-10-CM | POA: Diagnosis present

## 2023-05-06 DIAGNOSIS — L03115 Cellulitis of right lower limb: Principal | ICD-10-CM | POA: Diagnosis present

## 2023-05-06 DIAGNOSIS — Y842 Radiological procedure and radiotherapy as the cause of abnormal reaction of the patient, or of later complication, without mention of misadventure at the time of the procedure: Secondary | ICD-10-CM | POA: Diagnosis present

## 2023-05-06 DIAGNOSIS — Z882 Allergy status to sulfonamides status: Secondary | ICD-10-CM

## 2023-05-06 DIAGNOSIS — Z860101 Personal history of adenomatous and serrated colon polyps: Secondary | ICD-10-CM

## 2023-05-06 DIAGNOSIS — N182 Chronic kidney disease, stage 2 (mild): Secondary | ICD-10-CM | POA: Diagnosis present

## 2023-05-06 DIAGNOSIS — R1310 Dysphagia, unspecified: Secondary | ICD-10-CM | POA: Diagnosis present

## 2023-05-06 DIAGNOSIS — I82442 Acute embolism and thrombosis of left tibial vein: Principal | ICD-10-CM | POA: Diagnosis present

## 2023-05-06 DIAGNOSIS — Z8249 Family history of ischemic heart disease and other diseases of the circulatory system: Secondary | ICD-10-CM

## 2023-05-06 DIAGNOSIS — D84821 Immunodeficiency due to drugs: Secondary | ICD-10-CM | POA: Diagnosis present

## 2023-05-06 DIAGNOSIS — Z23 Encounter for immunization: Secondary | ICD-10-CM

## 2023-05-06 DIAGNOSIS — I82409 Acute embolism and thrombosis of unspecified deep veins of unspecified lower extremity: Secondary | ICD-10-CM

## 2023-05-06 DIAGNOSIS — T451X5A Adverse effect of antineoplastic and immunosuppressive drugs, initial encounter: Secondary | ICD-10-CM | POA: Diagnosis present

## 2023-05-06 DIAGNOSIS — Z7982 Long term (current) use of aspirin: Secondary | ICD-10-CM

## 2023-05-06 DIAGNOSIS — J449 Chronic obstructive pulmonary disease, unspecified: Secondary | ICD-10-CM | POA: Diagnosis present

## 2023-05-06 DIAGNOSIS — Z853 Personal history of malignant neoplasm of breast: Secondary | ICD-10-CM

## 2023-05-06 DIAGNOSIS — I2510 Atherosclerotic heart disease of native coronary artery without angina pectoris: Secondary | ICD-10-CM | POA: Diagnosis present

## 2023-05-06 DIAGNOSIS — I25118 Atherosclerotic heart disease of native coronary artery with other forms of angina pectoris: Secondary | ICD-10-CM | POA: Diagnosis present

## 2023-05-06 DIAGNOSIS — K219 Gastro-esophageal reflux disease without esophagitis: Secondary | ICD-10-CM | POA: Diagnosis present

## 2023-05-06 DIAGNOSIS — Z85858 Personal history of malignant neoplasm of other endocrine glands: Secondary | ICD-10-CM

## 2023-05-06 DIAGNOSIS — I480 Paroxysmal atrial fibrillation: Secondary | ICD-10-CM | POA: Diagnosis present

## 2023-05-06 DIAGNOSIS — C50511 Malignant neoplasm of lower-outer quadrant of right female breast: Secondary | ICD-10-CM

## 2023-05-06 DIAGNOSIS — I4892 Unspecified atrial flutter: Secondary | ICD-10-CM | POA: Diagnosis present

## 2023-05-06 DIAGNOSIS — Z833 Family history of diabetes mellitus: Secondary | ICD-10-CM

## 2023-05-06 DIAGNOSIS — F411 Generalized anxiety disorder: Secondary | ICD-10-CM | POA: Diagnosis present

## 2023-05-06 DIAGNOSIS — Z79899 Other long term (current) drug therapy: Secondary | ICD-10-CM

## 2023-05-06 DIAGNOSIS — Z8521 Personal history of malignant neoplasm of larynx: Secondary | ICD-10-CM

## 2023-05-06 NOTE — ED Triage Notes (Signed)
 Pt arrives via wheelchair to triage. Reports a hard plastic container fell onto her left leg. Presents with laceration to shin bleeding controlled. Leg is red and swollen. Pt has been unable to bear weight. Denies any other injury.

## 2023-05-06 NOTE — ED Notes (Signed)
 Pt to wheelchair subwait with husband as she is a currently on chemo/radiation.

## 2023-05-07 DIAGNOSIS — G2581 Restless legs syndrome: Secondary | ICD-10-CM | POA: Diagnosis present

## 2023-05-07 DIAGNOSIS — Z23 Encounter for immunization: Secondary | ICD-10-CM | POA: Diagnosis present

## 2023-05-07 DIAGNOSIS — D84821 Immunodeficiency due to drugs: Secondary | ICD-10-CM | POA: Diagnosis present

## 2023-05-07 DIAGNOSIS — N182 Chronic kidney disease, stage 2 (mild): Secondary | ICD-10-CM | POA: Diagnosis present

## 2023-05-07 DIAGNOSIS — K219 Gastro-esophageal reflux disease without esophagitis: Secondary | ICD-10-CM | POA: Diagnosis present

## 2023-05-07 DIAGNOSIS — I2510 Atherosclerotic heart disease of native coronary artery without angina pectoris: Secondary | ICD-10-CM | POA: Diagnosis present

## 2023-05-07 DIAGNOSIS — Z7982 Long term (current) use of aspirin: Secondary | ICD-10-CM | POA: Diagnosis not present

## 2023-05-07 DIAGNOSIS — L0390 Cellulitis, unspecified: Secondary | ICD-10-CM | POA: Diagnosis not present

## 2023-05-07 DIAGNOSIS — Z7960 Long term (current) use of unspecified immunomodulators and immunosuppressants: Secondary | ICD-10-CM | POA: Diagnosis not present

## 2023-05-07 DIAGNOSIS — G893 Neoplasm related pain (acute) (chronic): Secondary | ICD-10-CM | POA: Diagnosis present

## 2023-05-07 DIAGNOSIS — C3490 Malignant neoplasm of unspecified part of unspecified bronchus or lung: Secondary | ICD-10-CM | POA: Diagnosis present

## 2023-05-07 DIAGNOSIS — I82442 Acute embolism and thrombosis of left tibial vein: Secondary | ICD-10-CM | POA: Diagnosis present

## 2023-05-07 DIAGNOSIS — I25118 Atherosclerotic heart disease of native coronary artery with other forms of angina pectoris: Secondary | ICD-10-CM | POA: Diagnosis present

## 2023-05-07 DIAGNOSIS — I82461 Acute embolism and thrombosis of right calf muscular vein: Secondary | ICD-10-CM | POA: Diagnosis not present

## 2023-05-07 DIAGNOSIS — A419 Sepsis, unspecified organism: Secondary | ICD-10-CM | POA: Diagnosis not present

## 2023-05-07 DIAGNOSIS — T66XXXD Radiation sickness, unspecified, subsequent encounter: Secondary | ICD-10-CM | POA: Diagnosis not present

## 2023-05-07 DIAGNOSIS — F411 Generalized anxiety disorder: Secondary | ICD-10-CM | POA: Diagnosis present

## 2023-05-07 DIAGNOSIS — R1310 Dysphagia, unspecified: Secondary | ICD-10-CM | POA: Diagnosis present

## 2023-05-07 DIAGNOSIS — L03116 Cellulitis of left lower limb: Secondary | ICD-10-CM

## 2023-05-07 DIAGNOSIS — I82409 Acute embolism and thrombosis of unspecified deep veins of unspecified lower extremity: Secondary | ICD-10-CM

## 2023-05-07 DIAGNOSIS — Y842 Radiological procedure and radiotherapy as the cause of abnormal reaction of the patient, or of later complication, without mention of misadventure at the time of the procedure: Secondary | ICD-10-CM | POA: Diagnosis present

## 2023-05-07 DIAGNOSIS — J449 Chronic obstructive pulmonary disease, unspecified: Secondary | ICD-10-CM | POA: Diagnosis present

## 2023-05-07 DIAGNOSIS — E1122 Type 2 diabetes mellitus with diabetic chronic kidney disease: Secondary | ICD-10-CM | POA: Diagnosis present

## 2023-05-07 DIAGNOSIS — T451X5A Adverse effect of antineoplastic and immunosuppressive drugs, initial encounter: Secondary | ICD-10-CM | POA: Diagnosis present

## 2023-05-07 DIAGNOSIS — I824Z3 Acute embolism and thrombosis of unspecified deep veins of distal lower extremity, bilateral: Secondary | ICD-10-CM | POA: Diagnosis present

## 2023-05-07 DIAGNOSIS — E785 Hyperlipidemia, unspecified: Secondary | ICD-10-CM | POA: Diagnosis present

## 2023-05-07 DIAGNOSIS — I480 Paroxysmal atrial fibrillation: Secondary | ICD-10-CM | POA: Diagnosis present

## 2023-05-07 DIAGNOSIS — L03115 Cellulitis of right lower limb: Secondary | ICD-10-CM | POA: Diagnosis present

## 2023-05-07 DIAGNOSIS — I4892 Unspecified atrial flutter: Secondary | ICD-10-CM | POA: Diagnosis present

## 2023-05-07 DIAGNOSIS — Z882 Allergy status to sulfonamides status: Secondary | ICD-10-CM | POA: Diagnosis not present

## 2023-05-07 LAB — CBC WITH DIFFERENTIAL/PLATELET
Abs Immature Granulocytes: 0.02 10*3/uL (ref 0.00–0.07)
Abs Immature Granulocytes: 0.02 10*3/uL (ref 0.00–0.07)
Basophils Absolute: 0 10*3/uL (ref 0.0–0.1)
Basophils Absolute: 0 10*3/uL (ref 0.0–0.1)
Basophils Relative: 0 %
Basophils Relative: 1 %
Eosinophils Absolute: 0 10*3/uL (ref 0.0–0.5)
Eosinophils Absolute: 0 10*3/uL (ref 0.0–0.5)
Eosinophils Relative: 1 %
Eosinophils Relative: 2 %
HCT: 30.9 % — ABNORMAL LOW (ref 36.0–46.0)
HCT: 29.1 % — ABNORMAL LOW (ref 36.0–46.0)
Hemoglobin: 9.9 g/dL — ABNORMAL LOW (ref 12.0–15.0)
Hemoglobin: 9.6 g/dL — ABNORMAL LOW (ref 12.0–15.0)
Immature Granulocytes: 1 %
Immature Granulocytes: 1 %
Lymphocytes Relative: 19 %
Lymphocytes Relative: 20 %
Lymphs Abs: 0.5 10*3/uL — ABNORMAL LOW (ref 0.7–4.0)
Lymphs Abs: 0.5 10*3/uL — ABNORMAL LOW (ref 0.7–4.0)
MCH: 32.4 pg (ref 26.0–34.0)
MCH: 32.8 pg (ref 26.0–34.0)
MCHC: 32 g/dL (ref 30.0–36.0)
MCHC: 33 g/dL (ref 30.0–36.0)
MCV: 101 fL — ABNORMAL HIGH (ref 80.0–100.0)
MCV: 99.3 fL (ref 80.0–100.0)
Monocytes Absolute: 0.4 10*3/uL (ref 0.1–1.0)
Monocytes Absolute: 0.4 10*3/uL (ref 0.1–1.0)
Monocytes Relative: 16 %
Monocytes Relative: 19 %
Neutro Abs: 1.7 10*3/uL (ref 1.7–7.7)
Neutro Abs: 1.4 10*3/uL — ABNORMAL LOW (ref 1.7–7.7)
Neutrophils Relative %: 63 %
Neutrophils Relative %: 57 %
Platelets: 204 10*3/uL (ref 150–400)
Platelets: 205 10*3/uL (ref 150–400)
RBC: 3.06 MIL/uL — ABNORMAL LOW (ref 3.87–5.11)
RBC: 2.93 MIL/uL — ABNORMAL LOW (ref 3.87–5.11)
RDW: 15.6 % — ABNORMAL HIGH (ref 11.5–15.5)
RDW: 15.7 % — ABNORMAL HIGH (ref 11.5–15.5)
WBC: 2.7 10*3/uL — ABNORMAL LOW (ref 4.0–10.5)
WBC: 2.3 10*3/uL — ABNORMAL LOW (ref 4.0–10.5)
nRBC: 0 % (ref 0.0–0.2)
nRBC: 0 % (ref 0.0–0.2)

## 2023-05-07 LAB — COMPREHENSIVE METABOLIC PANEL
ALT: 14 U/L (ref 0–44)
AST: 22 U/L (ref 15–41)
Albumin: 2.9 g/dL — ABNORMAL LOW (ref 3.5–5.0)
Alkaline Phosphatase: 69 U/L (ref 38–126)
Anion gap: 13 (ref 5–15)
BUN: 9 mg/dL (ref 8–23)
CO2: 24 mmol/L (ref 22–32)
Calcium: 8.6 mg/dL — ABNORMAL LOW (ref 8.9–10.3)
Chloride: 97 mmol/L — ABNORMAL LOW (ref 98–111)
Creatinine, Ser: 0.82 mg/dL (ref 0.44–1.00)
GFR, Estimated: >60 mL/min (ref >60)
Glucose, Bld: 99 mg/dL (ref 70–99)
Potassium: 3.2 mmol/L — ABNORMAL LOW (ref 3.5–5.1)
Sodium: 134 mmol/L — ABNORMAL LOW (ref 135–145)
Total Bilirubin: 0.7 mg/dL (ref 0.0–1.2)
Total Protein: 5.8 g/dL — ABNORMAL LOW (ref 6.5–8.1)

## 2023-05-07 LAB — BASIC METABOLIC PANEL WITH GFR
Anion gap: 13 (ref 5–15)
BUN: 7 mg/dL — ABNORMAL LOW (ref 8–23)
CO2: 24 mmol/L (ref 22–32)
Calcium: 8.4 mg/dL — ABNORMAL LOW (ref 8.9–10.3)
Chloride: 100 mmol/L (ref 98–111)
Creatinine, Ser: 0.71 mg/dL (ref 0.44–1.00)
GFR, Estimated: >60 mL/min
Glucose, Bld: 89 mg/dL (ref 70–99)
Potassium: 3.3 mmol/L — ABNORMAL LOW (ref 3.5–5.1)
Sodium: 137 mmol/L (ref 135–145)

## 2023-05-07 LAB — PROCALCITONIN: Procalcitonin: <0.1 ng/mL

## 2023-05-07 LAB — LACTIC ACID, PLASMA: Lactic Acid, Venous: 1.3 mmol/L (ref 0.5–1.9)

## 2023-05-07 MED ORDER — OXYCODONE HCL 5 MG/5ML PO SOLN
5.0000 mg | ORAL | Status: DC | PRN
  Administered 2023-05-07 – 2023-05-08 (×3): 5 mg via ORAL
  Filled 2023-05-07 (×3): qty 5

## 2023-05-07 MED ORDER — SODIUM CHLORIDE 0.9 % IV SOLN
2.0000 g | Freq: Once | INTRAVENOUS | Status: AC
  Administered 2023-05-07: 2 g via INTRAVENOUS
  Filled 2023-05-07: qty 20

## 2023-05-07 MED ORDER — ROPINIROLE HCL 0.25 MG PO TABS
0.2500 mg | ORAL_TABLET | Freq: Every day | ORAL | Status: DC
  Administered 2023-05-07: 0.25 mg via ORAL
  Filled 2023-05-07: qty 1

## 2023-05-07 MED ORDER — ONDANSETRON HCL 4 MG PO TABS: 4.0000 mg | ORAL_TABLET | Freq: Four times a day (QID) | ORAL | Status: DC | PRN

## 2023-05-07 MED ORDER — GUAIFENESIN 100 MG/5ML PO LIQD
5.0000 mL | ORAL | Status: DC | PRN
  Administered 2023-05-07: 5 mL via ORAL
  Filled 2023-05-07: qty 10

## 2023-05-07 MED ORDER — SODIUM CHLORIDE 0.9 % IV SOLN: 1.0000 g | INTRAVENOUS | Status: DC

## 2023-05-07 MED ORDER — ONDANSETRON HCL 4 MG/2ML IJ SOLN: 4.0000 mg | Freq: Every morning | INTRAMUSCULAR | Status: DC

## 2023-05-07 MED ORDER — ONDANSETRON HCL 4 MG/2ML IJ SOLN
4.0000 mg | Freq: Once | INTRAMUSCULAR | Status: AC
  Administered 2023-05-07: 4 mg via INTRAVENOUS
  Filled 2023-05-07: qty 2

## 2023-05-07 MED ORDER — FENTANYL CITRATE PF 50 MCG/ML IJ SOSY
50.0000 ug | PREFILLED_SYRINGE | Freq: Once | INTRAMUSCULAR | Status: AC
  Administered 2023-05-07: 50 ug via INTRAVENOUS
  Filled 2023-05-07: qty 1

## 2023-05-07 MED ORDER — FENTANYL 12 MCG/HR TD PT72
1.0000 | MEDICATED_PATCH | TRANSDERMAL | Status: DC
Start: 2023-05-09 — End: 2023-05-08
  Filled 2023-05-07: qty 1

## 2023-05-07 MED ORDER — SUCRALFATE 1 GM/10ML PO SUSP
1.0000 g | Freq: Three times a day (TID) | ORAL | Status: DC
  Administered 2023-05-07 – 2023-05-08 (×6): 1 g via ORAL
  Filled 2023-05-07 (×7): qty 10

## 2023-05-07 MED ORDER — DOXYCYCLINE HYCLATE 100 MG PO TABS
100.0000 mg | ORAL_TABLET | Freq: Two times a day (BID) | ORAL | Status: DC
Start: 2023-05-07 — End: 2023-05-08
  Administered 2023-05-07 – 2023-05-08 (×2): 100 mg via ORAL
  Filled 2023-05-07 (×2): qty 1

## 2023-05-07 MED ORDER — APIXABAN 5 MG PO TABS
10.0000 mg | ORAL_TABLET | Freq: Two times a day (BID) | ORAL | Status: DC
  Administered 2023-05-07 – 2023-05-08 (×3): 10 mg via ORAL
  Filled 2023-05-07 (×3): qty 2

## 2023-05-07 MED ORDER — BACITRACIN ZINC 500 UNIT/GM EX OINT
TOPICAL_OINTMENT | Freq: Once | CUTANEOUS | Status: AC
  Administered 2023-05-07: 1 via TOPICAL
  Filled 2023-05-07: qty 1.8

## 2023-05-07 MED ORDER — SENNOSIDES-DOCUSATE SODIUM 8.6-50 MG PO TABS: 1.0000 | ORAL_TABLET | Freq: Every evening | ORAL | Status: DC | PRN

## 2023-05-07 MED ORDER — ACETAMINOPHEN 325 MG PO TABS: 650.0000 mg | ORAL_TABLET | Freq: Four times a day (QID) | ORAL | Status: DC | PRN

## 2023-05-07 MED ORDER — OMEPRAZOLE 2 MG/ML ORAL SUSPENSION
40.0000 mg | Freq: Every day | ORAL | Status: DC
  Administered 2023-05-07 – 2023-05-08 (×2): 40 mg via ORAL
  Filled 2023-05-07 (×2): qty 20

## 2023-05-07 MED ORDER — TETANUS-DIPHTH-ACELL PERTUSSIS 5-2.5-18.5 LF-MCG/0.5 IM SUSY
0.5000 mL | PREFILLED_SYRINGE | Freq: Once | INTRAMUSCULAR | Status: AC
  Administered 2023-05-07: 0.5 mL via INTRAMUSCULAR
  Filled 2023-05-07: qty 0.5

## 2023-05-07 MED ORDER — CEFADROXIL 500 MG PO CAPS
1000.0000 mg | ORAL_CAPSULE | Freq: Two times a day (BID) | ORAL | Status: DC
  Administered 2023-05-08: 1000 mg via ORAL
  Filled 2023-05-07: qty 2

## 2023-05-07 MED ORDER — ACETAMINOPHEN 650 MG RE SUPP
650.0000 mg | Freq: Four times a day (QID) | RECTAL | Status: DC | PRN
Start: 2023-05-07 — End: 2023-05-08

## 2023-05-07 MED ORDER — APIXABAN 5 MG PO TABS: 5.0000 mg | ORAL_TABLET | Freq: Two times a day (BID) | ORAL | Status: DC

## 2023-05-07 MED ORDER — BENZONATATE 100 MG PO CAPS
100.0000 mg | ORAL_CAPSULE | Freq: Three times a day (TID) | ORAL | Status: DC
  Administered 2023-05-07 – 2023-05-08 (×4): 100 mg via ORAL
  Filled 2023-05-07 (×4): qty 1

## 2023-05-07 MED ORDER — ONDANSETRON HCL 4 MG/2ML IJ SOLN: 4.0000 mg | Freq: Four times a day (QID) | INTRAMUSCULAR | Status: DC | PRN

## 2023-05-07 MED ORDER — VANCOMYCIN HCL 1250 MG/250ML IV SOLN
1250.0000 mg | Freq: Once | INTRAVENOUS | Status: AC
  Administered 2023-05-07: 1250 mg via INTRAVENOUS
  Filled 2023-05-07: qty 250

## 2023-05-07 NOTE — ED Provider Notes (Signed)
 [facility] Provider Note    Event Date/Time   First MD Initiated Contact with Patient 05/07/23 0013     (approximate)   History   Leg Injury (L)   HPI  NOELY KUHNLE is a 68 y.o. 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  Patient states that she dropped a plastic container on her leg and has an abrasion to the distal shin.  It is not actively bleeding but does have some clear fluid draining from it.  She reports that she is having increased redness and warmth in this leg and increased swelling.  She was recently diagnosed with cellulitis and put on Levaquin  which she is still taking.  She had an outpatient ultrasound of her leg on 05/04/2023 which showed below the knee thrombus in the posterior tibial vein.  She was started on Eliquis  which she states she has been compliant with.  She feels like the redness, warmth is worsening despite antibiotics and anticoagulation.  She denies any fevers.  She has had nausea but no vomiting.  No chest pain or shortness of breath.   History provided by patient, husband.    Past Medical History:  Diagnosis Date   Adenomatous colon polyp    Adrenal carcinoma, right (HCC) 12/28/2016   a.) high grade carcinoma with extensive necrosis --> s/p adrenalectomy   Anxiety    Aortic atherosclerosis (HCC)    Arthritis of right knee    Atrial fibrillation and flutter (HCC)    a.) CHA2DS2-VASc = 3 (age, sex, vascular disease history) as of 02/13/2023; b.) cardiac rate/rhythm maintained intrinsically without pharmacological intervention; no chronic OAC (does take low dose ASA)   B12 deficiency    Breast cancer (HCC) 1997   a.) stage IB (cT1a, cN0, cM0, G3, ER+, PR-, HER2-); s/p lumpectomy + XRT    COPD (chronic obstructive pulmonary disease) (HCC)    Coronary artery disease    a.) cCTA 12/09/2021: Ca2+ = 135 (80th %'ile for age/sex/race match contol) --> 25-49% pLAD and RCA distributions   Depression    Diastolic dysfunction    a.) TTE 12/11/2020, no RWMAs, norm RVSF, G1DD; b.) TTE 12/14/2021: EF 55-60, no RWMAs, norm RVSF, G1DD; c.) TTE 01/27/2023: EF 50-55%, no RWMAs, G1DD, mild RAE, mild MR, mod TR   Dry cough 03/02/2017   Dyspnea    Full dentures    GERD (gastroesophageal reflux disease)    History of hiatal hernia    HLD (hyperlipidemia)    Invasive carcinoma of breast (HCC) 11/15/2017   a.) path (+) for G3, ER -, PR -, HER2/neu - ; s/p lumpectomy 01/17/2018 + 4 cycles TC systemic chemotherapy + XRT   Long term current use of aspirin     Personal history of chemotherapy    Personal history of radiation therapy    Pneumothorax of left lung after biopsy 01/25/2023   a.) hypoxic in PACU requiring re-intubation --> transferred to ICU --> chest tube placed by PCCM   RLS (restless legs syndrome)    a.) on ropinirole    Skin cancer    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   a.) Stage IB (cT1a, cN0, cM0, G3, ER+, PR-, HER2-)    Past Surgical History:  Procedure Laterality Date   BIOPSY  01/05/2023  Procedure: BIOPSY;  Surgeon: Unk Corinn Skiff, MD;  Location: [facility] ENDOSCOPY;  Service: Gastroenterology;;   BREAST BIOPSY Right 1997   positive   BREAST BIOPSY Right 11/15/2017   INVASIVE MAMMARY CARCINOMA triple negative   BREAST BIOPSY Right 07/20/2020   us  bx, Q marker, neg   BREAST LUMPECTOMY Right 1997   2019 also   BREAST LUMPECTOMY WITH SENTINEL LYMPH NODE BIOPSY Right 12/08/2017   Procedure: BREAST LUMPECTOMY WITH SENTINEL LYMPH NODE BX;  Surgeon: Dessa Reyes ORN, MD;  Location: [facility] ORS;  Service: General;  Laterality: Right;   BREAST REDUCTION WITH MASTOPEXY Left 06/10/2019   Procedure: Left breast mastopexy reduction for symmetry;  Surgeon:  Lowery Estefana RAMAN, DO;  Location: [facility];  Service: Plastics;  Laterality: Left;  total 2.5 hours   CATARACT EXTRACTION W/PHACO Right 02/07/2022   Procedure: CATARACT EXTRACTION PHACO AND INTRAOCULAR LENS PLACEMENT (IOC) RIGHT DIABETIC;  Surgeon: Myrna Adine Anes, MD;  Location: [facility];  Service: Ophthalmology;  Laterality: Right;  3.60 00:34.7   CATARACT EXTRACTION W/PHACO Left 02/28/2022   Procedure: CATARACT EXTRACTION PHACO AND INTRAOCULAR LENS PLACEMENT (IOC) LEFT DIABETIC 4.11 00:26.1;  Surgeon: Myrna Adine Anes, MD;  Location: [facility];  Service: Ophthalmology;  Laterality: Left;   COLONOSCOPY  2015   COLONOSCOPY N/A 01/05/2023   Procedure: COLONOSCOPY;  Surgeon: Unk Corinn Skiff, MD;  Location: [facility] ENDOSCOPY;  Service: Gastroenterology;  Laterality: N/A;   COLONOSCOPY WITH PROPOFOL  N/A 10/29/2019   Procedure: COLONOSCOPY WITH PROPOFOL ;  Surgeon: Jinny Carmine, MD;  Location: [facility] ENDOSCOPY;  Service: Endoscopy;  Laterality: N/A;   ESOPHAGOGASTRODUODENOSCOPY (EGD) WITH PROPOFOL  N/A 05/24/2019   Procedure: ESOPHAGOGASTRODUODENOSCOPY (EGD) WITH PROPOFOL ;  Surgeon: Jinny Carmine, MD;  Location: [facility] ENDOSCOPY;  Service: Endoscopy;  Laterality: N/A;   ESOPHAGOGASTRODUODENOSCOPY (EGD) WITH PROPOFOL  N/A 09/06/2022   Procedure: ESOPHAGOGASTRODUODENOSCOPY (EGD) WITH PROPOFOL ;  Surgeon: Jinny Carmine, MD;  Location: [facility] ENDOSCOPY;  Service: Endoscopy;  Laterality: N/A;   ESOPHAGOGASTRODUODENOSCOPY (EGD) WITH PROPOFOL   01/05/2023   Procedure: ESOPHAGOGASTRODUODENOSCOPY (EGD) WITH PROPOFOL ;  Surgeon: Unk Corinn Skiff, MD;  Location: [facility] ENDOSCOPY;  Service: Gastroenterology;;   ESOPHAGUS SURGERY     IR IMAGING GUIDED PORT INSERTION  02/07/2023   KNEE SURGERY Right    LIPOSUCTION WITH LIPOFILLING Right 06/10/2019   Procedure: right breast scar and capsule release with fat grafting;  Surgeon: Lowery Estefana RAMAN, DO;  Location: [facility];  Service: Plastics;  Laterality: Right;   POLYPECTOMY  01/05/2023   Procedure: POLYPECTOMY;  Surgeon: Unk Corinn Skiff, MD;  Location: [facility] ENDOSCOPY;  Service: Gastroenterology;;   [facility] REMOVAL     PORTACATH PLACEMENT Right 01/17/2018   Procedure: INSERTION PORT-A-CATH- RIGHT;  Surgeon: Dessa Reyes ORN, MD;  Location: [facility];  Service: General;  Laterality: Right;   ROBOTIC ADRENALECTOMY Right 12/28/2016   Procedure: ROBOTIC ADRENALECTOMY;  Surgeon: Penne Knee, MD;  Location: [facility];  Service: Urology;  Laterality: Right;   SKIN CANCER EXCISION     THROAT SURGERY  1998   throat cancer    TUBAL LIGATION     VENTRAL HERNIA REPAIR N/A 03/03/2017   Procedure: HERNIA REPAIR VENTRAL ADULT;  Surgeon: Shelva Dunnings, MD;  Location: [facility] ORS;  Service: General;  Laterality: N/A;   VIDEO BRONCHOSCOPY WITH ENDOBRONCHIAL ULTRASOUND N/A 01/25/2023   Procedure: VIDEO BRONCHOSCOPY WITH ENDOBRONCHIAL ULTRASOUND;  Surgeon: Parris Manna, MD;  Location: [facility] ORS;  Service: Thoracic;  Laterality: N/A;   VIDEO BRONCHOSCOPY WITH ENDOBRONCHIAL ULTRASOUND N/A 02/17/2023   Procedure:  VIDEO BRONCHOSCOPY WITH ENDOBRONCHIAL ULTRASOUND;  Surgeon: Parris Manna, MD;  Location: [facility];  Service: Thoracic;  Laterality: N/A;    MEDICATIONS:  Prior to Admission medications   Medication Sig Start Date End Date Taking? Authorizing Provider  APIXABAN  (ELIQUIS ) VTE STARTER PACK (10MG  AND 5MG ) Take as directed on package: start with two-5mg  tablets twice daily for 7 days. On day 8, switch to one-5mg  tablet twice daily. 05/04/23   Borders, Fonda SAUNDERS, NP  benzonatate  (TESSALON ) 100 MG capsule Take 2 capsules (200 mg total) by mouth 2 (two) times daily as needed for cough. 03/08/23   Emilio Kelly DASEN, FNP  buPROPion  (WELLBUTRIN  XL) 150 MG 24 hr tablet Take 3 tablets (450 mg total) by mouth daily. Patient not taking: Reported on 04/12/2023 01/16/23   Emilio Kelly DASEN, FNP  busPIRone  (BUSPAR ) 5 MG tablet  Take 1 tablet (5 mg total) by mouth 3 (three) times daily. Patient not taking: Reported on 04/12/2023 10/12/22   Emilio Kelly T, FNP  cyclobenzaprine  (FLEXERIL ) 5 MG tablet TAKE 1 TABLET BY MOUTH AT BEDTIME Patient not taking: Reported on 04/12/2023 12/14/22   Emilio Kelly DASEN, FNP  dexamethasone  (DECADRON ) 4 MG tablet Take 2 tablets daily for 2 days, start the day after chemotherapy. Take with food. Patient not taking: Reported on 05/04/2023 03/09/23   Melanee Annah BROCKS, MD  DULoxetine  (CYMBALTA ) 60 MG capsule Take 1 capsule (60 mg total) by mouth 2 (two) times daily. Patient not taking: Reported on 04/12/2023 10/12/22   Emilio Kelly DASEN, FNP  fentaNYL  (DURAGESIC ) 12 MCG/HR Place 1 patch onto the skin every 3 (three) days. 04/28/23   Dasie Tinnie MATSU, NP  fluconazole  (DIFLUCAN ) 10 MG/ML suspension TAKE 10 ML (100 MG TOTAL) BY MOUTH DAILY 05/01/23   Dasie Tinnie MATSU, NP  levofloxacin  (LEVAQUIN ) 25 MG/ML solution Take 20 mLs (500 mg total) by mouth daily for 7 days. 05/01/23 05/08/23  Dasie Tinnie MATSU, NP  lidocaine  (LIDODERM ) 5 % Place 1 patch onto the skin daily. Remove & Discard patch within 12 hours or as directed by MD    [provider]  lidocaine -prilocaine  (EMLA ) cream Apply to affected area once 03/09/23   Melanee Annah BROCKS, MD  magic mouthwash w/lidocaine  SOLN Take 5 mLs by mouth 3 (three) times daily as needed for mouth pain. Suspension contains equal amounts of Maalox Extra Strength, nystatin , diphenhydramine  and lidocaine . Patient not taking: Reported on 05/04/2023 04/12/23   Borders, Fonda SAUNDERS, NP  montelukast  (SINGULAIR ) 10 MG tablet Take 1 tablet (10 mg total) by mouth at bedtime. Patient not taking: Reported on 04/12/2023 07/14/22   Bacigalupo, Angela M, MD  omeprazole  (KONVOMEP ) 2 mg/mL SUSP oral suspension Take 20 mLs (40 mg total) by mouth daily. 04/12/23   Borders, Fonda SAUNDERS, NP  omeprazole  (PRILOSEC) 20 MG capsule Take 1 capsule (20 mg total) by mouth 2 (two) times daily before a meal. Patient  not taking: Reported on 04/12/2023 02/14/23   Emilio Kelly DASEN, FNP  ondansetron  (ZOFRAN ) 8 MG tablet Take 1 tablet (8 mg total) by mouth every 8 (eight) hours as needed for nausea or vomiting. Start on the third day after chemotherapy. Patient not taking: Reported on 05/04/2023 03/09/23   Melanee Annah BROCKS, MD  ondansetron  (ZOFRAN -ODT) 4 MG disintegrating tablet Take 1 tablet (4 mg total) by mouth every 8 (eight) hours as needed for nausea or vomiting. 04/12/23   Borders, Fonda SAUNDERS, NP  oxyCODONE  (ROXICODONE ) 5 MG/5ML solution Take 5 mLs (5 mg total) by mouth every  4 (four) hours as needed for severe pain (pain score 7-10). 05/02/23   Borders, Fonda SAUNDERS, NP  prochlorperazine  (COMPAZINE ) 10 MG tablet Take 1 tablet (10 mg total) by mouth every 6 (six) hours as needed for nausea or vomiting. Patient not taking: Reported on 04/12/2023 03/09/23   Melanee Annah BROCKS, MD  rOPINIRole  (REQUIP ) 0.25 MG tablet TAKE 1 TABLET BY MOUTH AT BEDTIME Patient not taking: Reported on 05/04/2023 08/04/22   Emilio Kelly DASEN, FNP  rosuvastatin  (CRESTOR ) 40 MG tablet TAKE 1 TABLET BY MOUTH DAILY 01/31/23   Emilio Kelly DASEN, FNP  sucralfate  (CARAFATE ) 1 g tablet Take 1 tablet (1 g total) by mouth 3 (three) times daily before meals. Patient not taking: Reported on 04/17/2023 03/28/23   Lenn Aran, MD  sucralfate  (CARAFATE ) 1 GM/10ML suspension Take 10 mLs (1 g total) by mouth 4 (four) times daily -  with meals and at bedtime. 04/12/23   Borders, Fonda SAUNDERS, NP  tolterodine  (DETROL  LA) 4 MG 24 hr capsule Take 1 capsule (4 mg total) by mouth daily. Patient not taking: Reported on 04/17/2023 03/01/23   Gaston Hamilton, MD    Physical Exam   Triage Vital Signs: ED Triage Vitals  Encounter Vitals Group     BP 05/06/23 2244 134/82     Systolic BP Percentile --      Diastolic BP Percentile --      Pulse Rate 05/06/23 2244 (!) 102     Resp 05/06/23 2244 (!) 21     Temp 05/06/23 2244 98.2 F (36.8 C)     Temp Source 05/06/23 2244 Oral      SpO2 05/06/23 2244 99 %     Weight 05/06/23 2245 115 lb 9.6 oz (52.4 kg)     Height 05/06/23 2245 5' 1 (1.549 m)     Head Circumference --      Peak Flow --      Pain Score 05/06/23 2245 6     Pain Loc --      Pain Education --      Exclude from Growth Chart --     Most recent vital signs: Vitals:   05/06/23 2244  BP: 134/82  Pulse: (!) 102  Resp: (!) 21  Temp: 98.2 F (36.8 C)  SpO2: 99%    CONSTITUTIONAL: Alert, responds appropriately to questions.  Chronically ill-appearing HEAD: Normocephalic, atraumatic EYES: Conjunctivae clear, pupils appear equal, sclera nonicteric ENT: normal nose; moist mucous membranes NECK: Supple, normal ROM CARD: Regular and minimally tachycardic; S1 and S2 appreciated RESP: Normal chest excursion without splinting or tachypnea; breath sounds clear and equal bilaterally; no wheezes, no rhonchi, no rales, no hypoxia or respiratory distress, speaking full sentences ABD/GI: Non-distended; soft, non-tender, no rebound, no guarding, no peritoneal signs BACK: The back appears normal EXT: 2+ DP pulses bilaterally.  Patient has increased soft tissue swelling in the left leg compared to the right.  Compartments are soft.  Left leg has increased warmth and redness from the foot all the way just below the knee.  There is a superficial abrasion to the mid shin with mild clear drainage but no active bleeding or laceration that needs repair.  No fluctuance or induration. SKIN: Normal color for age and race; warm; no rash on exposed skin NEURO: Moves all extremities equally, normal speech PSYCH: The patient's mood and manner are appropriate.      Patient gave verbal permission to utilize photo for medical documentation only. The image was not stored on any  personal device.  ED Results / Procedures / Treatments   LABS: (all labs ordered are listed, but only abnormal results are displayed) Labs Reviewed  CBC WITH DIFFERENTIAL/PLATELET - Abnormal; Notable  for the following components:      Result Value   WBC 2.7 (*)    RBC 3.06 (*)    Hemoglobin 9.9 (*)    HCT 30.9 (*)    MCV 101.0 (*)    RDW 15.6 (*)    Lymphs Abs 0.5 (*)    All other components within normal limits  COMPREHENSIVE METABOLIC PANEL - Abnormal; Notable for the following components:   Sodium 134 (*)    Potassium 3.2 (*)    Chloride 97 (*)    Calcium  8.6 (*)    Total Protein 5.8 (*)    Albumin  2.9 (*)    All other components within normal limits  CULTURE, BLOOD (ROUTINE X 2)  CULTURE, BLOOD (ROUTINE X 2)  LACTIC ACID, PLASMA  PROCALCITONIN     EKG:  RADIOLOGY: My personal review and interpretation of imaging: X-ray of the left leg shows no acute abnormality.  I have personally reviewed all radiology reports.   DG Tibia/Fibula Left Result Date: 05/06/2023 CLINICAL DATA:  Blunt trauma to the lower left leg, initial encounter EXAM: LEFT TIBIA AND FIBULA - 2 VIEW COMPARISON:  None Available. FINDINGS: Mild soft tissue swelling is noted. No acute fracture or dislocation is seen. IMPRESSION: Swelling without acute bony abnormality. Electronically Signed   By: Oneil Devonshire M.D.   On: 05/06/2023 23:18     PROCEDURES:  Critical Care performed: Yes, see critical care procedure note(s)   CRITICAL CARE Performed by: Josette Demiya Magno   Total critical care time: 30 minutes  Critical care time was exclusive of separately billable procedures and treating other patients.  Critical care was necessary to treat or prevent imminent or life-threatening deterioration.  Critical care was time spent personally by me on the following activities: development of treatment plan with patient and/or surrogate as well as nursing, discussions with consultants, evaluation of patient's response to treatment, examination of patient, obtaining history from patient or surrogate, ordering and performing treatments and interventions, ordering and review of laboratory studies, ordering and review of  radiographic studies, pulse oximetry and re-evaluation of patient's condition.   SABRA1-3 Lead EKG Interpretation  Performed by: Lexx Monte, Josette SAILOR, DO Authorized by: Mickala Laton, Josette SAILOR, DO     Interpretation: abnormal     ECG rate:  102   ECG rate assessment: tachycardic     Rhythm: sinus tachycardia     Ectopy: none     Conduction: normal       IMPRESSION / MDM / ASSESSMENT AND PLAN / ED COURSE  I reviewed the triage vital signs and the nursing notes.    Patient here with complaints of worsening cellulitis in the left lower extremity and a superficial abrasion that she retained today when a plastic container dropped on her leg.  The patient is on the cardiac monitor to evaluate for evidence of arrhythmia and/or significant heart rate changes.   DIFFERENTIAL DIAGNOSIS (includes but not limited to):   Worsening cellulitis, sepsis in the setting of chemotherapy, no sign of compartment syndrome, gout, septic arthritis.  Low suspicion for fracture or osteomyelitis.   Patient's presentation is most consistent with acute presentation with potential threat to life or bodily function.   PLAN: Patient here with worsening cellulitis to the left lower extremity.  She has been on Levaquin  and  reports compliance.  Also recently diagnosed with DVT to this leg and has been on Eliquis .  X-ray obtained from triage and reviewed/interpreted by myself and the radiologist and is unremarkable.  She has a superficial abrasion today from a minor injury.  No laceration that needs repair.  Will clean this wound, apply bacitracin  and update her tetanus vaccine.  Will obtain labs, cultures.  Will give ceftriaxone , vancomycin  for broad coverage.  Will give pain medication.  I feel she will need admission given she is not improving with outpatient antibiotics and is immunosuppressed.   MEDICATIONS GIVEN IN ED: Medications  vancomycin  (VANCOREADY) IVPB 1250 mg/250 mL (1,250 mg Intravenous New Bag/Given 05/07/23 0205)   Tdap (BOOSTRIX) injection 0.5 mL (0.5 mLs Intramuscular Given 05/07/23 0131)  bacitracin  ointment (1 Application Topical Given 05/07/23 0126)  cefTRIAXone  (ROCEPHIN ) 2 g in sodium chloride  0.9 % 100 mL IVPB (0 g Intravenous Stopped 05/07/23 0204)  fentaNYL  (SUBLIMAZE ) injection 50 mcg (50 mcg Intravenous Given 05/07/23 0128)  ondansetron  (ZOFRAN ) injection 4 mg (4 mg Intravenous Given 05/07/23 0128)     ED COURSE: Labs show white count of 2.7.  Normal electrolytes.  Lactic normal at 1.3.  Will discuss with hospitalist for admission.   CONSULTS:  Consulted and discussed patient's case with hospitalist, Dr. Laveda.  I have recommended admission and consulting physician agrees and will place admission orders.  Patient (and family if present) agree with this plan.   I reviewed all nursing notes, vitals, pertinent previous records.  All labs, EKGs, imaging ordered have been independently reviewed and interpreted by myself.    OUTSIDE RECORDS REVIEWED: Reviewed recent oncology notes.       FINAL CLINICAL IMPRESSION(S) / ED DIAGNOSES   Final diagnoses:  Cellulitis of left lower extremity     Rx / DC Orders   ED Discharge Orders     None        Note:  This document was prepared using Dragon voice recognition software and may include unintentional dictation errors.   Martie Fulgham, Josette SAILOR, DO 05/07/23 [phone]

## 2023-05-07 NOTE — H&P (Signed)
 PCP:   [facility]   Chief Complaint:  Right lower extremity swelling.  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   Patient taking chemo weekly for 6 cycles.  Patient is on radiation 5 days a week for 6 cycles.  She is currently in week #4.  Her oncologist is Dr. Melanee.  Patient diagnosed with RLE cellulitis at her oncologist office on 12/30.  Started on Levaquin  500 mg daily for 7 days.  On follow-up visit 1/2 patient return to oncologist office with RLE swelling ultrasound positive for DVT.  Started on Eliquis  Dosepak.  She has been compliant with medications.  Despite this the swelling in the right lower extremity is continued to worsen.  The yesterday she started draining particles of clear fluid on the floor.  She denies fever, endorses chills.  She endorses nausea but no vomiting.  She additionally has a dry cough.    Review of Systems:  Per HPI  Past Medical History: Past Medical History:  Diagnosis Date   Adenomatous colon polyp    Adrenal carcinoma, right (HCC) 12/28/2016   a.) high grade carcinoma with extensive necrosis --> s/p adrenalectomy   Anxiety    Aortic atherosclerosis (HCC)    Arthritis of right knee    Atrial fibrillation and flutter (HCC)    a.) CHA2DS2-VASc = 3 (age, sex, vascular disease history) as of 02/13/2023; b.) cardiac rate/rhythm maintained intrinsically without pharmacological intervention; no chronic OAC (does take low dose ASA)   B12 deficiency    Breast cancer (HCC) 1997   a.) stage IB (cT1a, cN0, cM0, G3, ER+, PR-, HER2-); s/p lumpectomy + XRT   COPD (chronic obstructive pulmonary disease)  (HCC)    Coronary artery disease    a.) cCTA 12/09/2021: Ca2+ = 135 (80th %'ile for age/sex/race match contol) --> 25-49% pLAD and RCA distributions   Depression    Diastolic dysfunction    a.) TTE 12/11/2020, no RWMAs, norm RVSF, G1DD; b.) TTE 12/14/2021: EF 55-60, no RWMAs, norm RVSF, G1DD; c.) TTE 01/27/2023: EF 50-55%, no RWMAs, G1DD, mild RAE, mild MR, mod TR   Dry cough 03/02/2017   Dyspnea    Full dentures    GERD (gastroesophageal reflux disease)    History of hiatal hernia    HLD (hyperlipidemia)    Invasive carcinoma of breast (HCC) 11/15/2017   a.) path (+) for G3, ER -, PR -, HER2/neu - ; s/p lumpectomy 01/17/2018 + 4 cycles TC systemic chemotherapy + XRT   Long term current use of aspirin     Personal history of chemotherapy    Personal history of radiation therapy    Pneumothorax of left lung after biopsy 01/25/2023   a.) hypoxic in PACU requiring re-intubation --> transferred to ICU --> chest tube placed by PCCM   RLS (restless legs syndrome)    a.) on ropinirole    Skin cancer    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   a.) Stage IB (cT1a, cN0, cM0, G3, ER+, PR-, HER2-)   Past Surgical History:  Procedure Laterality Date   BIOPSY  01/05/2023   Procedure: BIOPSY;  Surgeon: Unk Corinn Skiff, MD;  Location: [facility];  Service: Gastroenterology;;  BREAST BIOPSY Right 1997   positive   BREAST BIOPSY Right 11/15/2017   INVASIVE MAMMARY CARCINOMA triple negative   BREAST BIOPSY Right 07/20/2020   us  bx, Q marker, neg   BREAST LUMPECTOMY Right 1997   2019 also   BREAST LUMPECTOMY WITH SENTINEL LYMPH NODE BIOPSY Right 12/08/2017   Procedure: BREAST LUMPECTOMY WITH SENTINEL LYMPH NODE BX;  Surgeon: Dessa Reyes ORN, MD;  Location: [facility];  Service: General;  Laterality: Right;   BREAST REDUCTION WITH MASTOPEXY Left 06/10/2019   Procedure: Left breast mastopexy reduction for symmetry;  Surgeon: Lowery Estefana RAMAN, DO;  Location: [facility];  Service: Plastics;  Laterality: Left;  total 2.5 hours   CATARACT EXTRACTION W/PHACO Right 02/07/2022   Procedure: CATARACT EXTRACTION PHACO AND INTRAOCULAR LENS PLACEMENT (IOC) RIGHT DIABETIC;  Surgeon: Myrna Adine Anes, MD;  Location: [facility];  Service: Ophthalmology;  Laterality: Right;  3.60 00:34.7   CATARACT EXTRACTION W/PHACO Left 02/28/2022   Procedure: CATARACT EXTRACTION PHACO AND INTRAOCULAR LENS PLACEMENT (IOC) LEFT DIABETIC 4.11 00:26.1;  Surgeon: Myrna Adine Anes, MD;  Location: [facility];  Service: Ophthalmology;  Laterality: Left;   COLONOSCOPY  2015   COLONOSCOPY N/A 01/05/2023   Procedure: COLONOSCOPY;  Surgeon: Unk Corinn Skiff, MD;  Location: [facility];  Service: Gastroenterology;  Laterality: N/A;   COLONOSCOPY WITH PROPOFOL  N/A 10/29/2019   Procedure: COLONOSCOPY WITH PROPOFOL ;  Surgeon: Jinny Carmine, MD;  Location: [facility];  Service: Endoscopy;  Laterality: N/A;   ESOPHAGOGASTRODUODENOSCOPY (EGD) WITH PROPOFOL  N/A 05/24/2019   Procedure: ESOPHAGOGASTRODUODENOSCOPY (EGD) WITH PROPOFOL ;  Surgeon: Jinny Carmine, MD;  Location: [facility];  Service: Endoscopy;  Laterality: N/A;   ESOPHAGOGASTRODUODENOSCOPY (EGD) WITH PROPOFOL  N/A 09/06/2022   Procedure: ESOPHAGOGASTRODUODENOSCOPY (EGD) WITH PROPOFOL ;  Surgeon: Jinny Carmine, MD;  Location: [facility];  Service: Endoscopy;  Laterality: N/A;   ESOPHAGOGASTRODUODENOSCOPY (EGD) WITH PROPOFOL   01/05/2023   Procedure: ESOPHAGOGASTRODUODENOSCOPY (EGD) WITH PROPOFOL ;  Surgeon: Unk Corinn Skiff, MD;  Location: [facility];  Service: Gastroenterology;;   ESOPHAGUS SURGERY     IR IMAGING GUIDED PORT INSERTION  02/07/2023   KNEE SURGERY Right    LIPOSUCTION WITH LIPOFILLING Right 06/10/2019   Procedure: right breast scar and capsule release with fat grafting;  Surgeon: Lowery Estefana RAMAN, DO;  Location: [facility];  Service: Plastics;  Laterality: Right;   POLYPECTOMY   01/05/2023   Procedure: POLYPECTOMY;  Surgeon: Unk Corinn Skiff, MD;  Location: [facility];  Service: Gastroenterology;;   [facility] REMOVAL     PORTACATH PLACEMENT Right 01/17/2018   Procedure: INSERTION PORT-A-CATH- RIGHT;  Surgeon: Dessa Reyes ORN, MD;  Location: [facility];  Service: General;  Laterality: Right;   ROBOTIC ADRENALECTOMY Right 12/28/2016   Procedure: ROBOTIC ADRENALECTOMY;  Surgeon: Penne Knee, MD;  Location: [facility];  Service: Urology;  Laterality: Right;   SKIN CANCER EXCISION     THROAT SURGERY  1998   throat cancer    TUBAL LIGATION     VENTRAL HERNIA REPAIR N/A 03/03/2017   Procedure: HERNIA REPAIR VENTRAL ADULT;  Surgeon: Shelva Dunnings, MD;  Location: [facility];  Service: General;  Laterality: N/A;   VIDEO BRONCHOSCOPY WITH ENDOBRONCHIAL ULTRASOUND N/A 01/25/2023   Procedure: VIDEO BRONCHOSCOPY WITH ENDOBRONCHIAL ULTRASOUND;  Surgeon: Parris Manna, MD;  Location: [facility];  Service: Thoracic;  Laterality: N/A;   VIDEO BRONCHOSCOPY WITH ENDOBRONCHIAL ULTRASOUND N/A 02/17/2023   Procedure: VIDEO BRONCHOSCOPY WITH ENDOBRONCHIAL ULTRASOUND;  Surgeon: Parris Manna, MD;  Location: [facility];  Service: Thoracic;  Laterality: N/A;    Medications: Prior to Admission medications   Medication Sig Start Date End Date Taking? Authorizing Provider  APIXABAN  (ELIQUIS ) VTE STARTER PACK (10MG  AND 5MG ) Take as directed on package: start with two-5mg  tablets twice daily for 7 days. On day 8, switch to one-5mg  tablet twice daily. 05/04/23   Borders, Fonda SAUNDERS, NP  benzonatate  (TESSALON ) 100 MG capsule Take 2 capsules (200 mg total) by mouth 2 (two) times daily as needed for cough. 03/08/23   Emilio Kelly DASEN, FNP  buPROPion  (WELLBUTRIN  XL) 150 MG 24 hr tablet Take 3 tablets (450 mg total) by mouth daily. Patient not taking: Reported on 04/12/2023 01/16/23   Emilio Kelly DASEN, FNP  busPIRone  (BUSPAR ) 5 MG tablet Take 1 tablet (5 mg total) by mouth 3 (three) times  daily. Patient not taking: Reported on 04/12/2023 10/12/22   Emilio Kelly T, FNP  cyclobenzaprine  (FLEXERIL ) 5 MG tablet TAKE 1 TABLET BY MOUTH AT BEDTIME Patient not taking: Reported on 04/12/2023 12/14/22   Emilio Kelly DASEN, FNP  dexamethasone  (DECADRON ) 4 MG tablet Take 2 tablets daily for 2 days, start the day after chemotherapy. Take with food. Patient not taking: Reported on 05/04/2023 03/09/23   Melanee Annah BROCKS, MD  DULoxetine  (CYMBALTA ) 60 MG capsule Take 1 capsule (60 mg total) by mouth 2 (two) times daily. Patient not taking: Reported on 04/12/2023 10/12/22   Emilio Kelly T, FNP  fentaNYL  (DURAGESIC ) 12 MCG/HR Place 1 patch onto the skin every 3 (three) days. 04/28/23   Dasie Tinnie MATSU, NP  fluconazole  (DIFLUCAN ) 10 MG/ML suspension TAKE 10 ML (100 MG TOTAL) BY MOUTH DAILY 05/01/23   Dasie Tinnie MATSU, NP  levofloxacin  (LEVAQUIN ) 25 MG/ML solution Take 20 mLs (500 mg total) by mouth daily for 7 days. 05/01/23 05/08/23  Dasie Tinnie MATSU, NP  lidocaine  (LIDODERM ) 5 % Place 1 patch onto the skin daily. Remove & Discard patch within 12 hours or as directed by MD    [provider]  lidocaine -prilocaine  (EMLA ) cream Apply to affected area once 03/09/23   Melanee Annah BROCKS, MD  magic mouthwash w/lidocaine  SOLN Take 5 mLs by mouth 3 (three) times daily as needed for mouth pain. Suspension contains equal amounts of Maalox Extra Strength, nystatin , diphenhydramine  and lidocaine . Patient not taking: Reported on 05/04/2023 04/12/23   Borders, Fonda SAUNDERS, NP  montelukast  (SINGULAIR ) 10 MG tablet Take 1 tablet (10 mg total) by mouth at bedtime. Patient not taking: Reported on 04/12/2023 07/14/22   Bacigalupo, Angela M, MD  omeprazole  (KONVOMEP ) 2 mg/mL SUSP oral suspension Take 20 mLs (40 mg total) by mouth daily. 04/12/23   Borders, Fonda SAUNDERS, NP  omeprazole  (PRILOSEC) 20 MG capsule Take 1 capsule (20 mg total) by mouth 2 (two) times daily before a meal. Patient not taking: Reported on 04/12/2023 02/14/23   Emilio Kelly DASEN, FNP  ondansetron  (ZOFRAN ) 8 MG tablet Take 1 tablet (8 mg total) by mouth every 8 (eight) hours as needed for nausea or vomiting. Start on the third day after chemotherapy. Patient not taking: Reported on 05/04/2023 03/09/23   Melanee Annah BROCKS, MD  ondansetron  (ZOFRAN -ODT) 4 MG disintegrating tablet Take 1 tablet (4 mg total) by mouth every 8 (eight) hours as needed for nausea or vomiting. 04/12/23   Borders, Fonda SAUNDERS, NP  oxyCODONE  (ROXICODONE ) 5 MG/5ML solution Take 5 mLs (5 mg total) by mouth every 4 (four) hours as needed for severe pain (pain score 7-10). 05/02/23   Borders, Fonda SAUNDERS, NP  prochlorperazine  (COMPAZINE ) 10 MG tablet Take 1 tablet (10 mg total) by mouth every 6 (six) hours as needed for nausea or vomiting. Patient not taking: Reported on 04/12/2023 03/09/23   Melanee Annah BROCKS, MD  rOPINIRole  (REQUIP ) 0.25 MG tablet TAKE 1 TABLET BY MOUTH AT BEDTIME Patient not taking: Reported on 05/04/2023 08/04/22   Emilio Kelly DASEN, FNP  rosuvastatin  (CRESTOR ) 40 MG tablet TAKE 1 TABLET BY MOUTH DAILY 01/31/23   Emilio Kelly DASEN, FNP  sucralfate  (CARAFATE ) 1 g tablet Take 1 tablet (1 g total) by mouth 3 (three) times daily before meals. Patient not taking: Reported on 04/17/2023 03/28/23   Lenn Aran, MD  sucralfate  (CARAFATE ) 1 GM/10ML suspension Take 10 mLs (1 g total) by mouth 4 (four) times daily -  with meals and at bedtime. 04/12/23   Borders, Fonda SAUNDERS, NP  tolterodine  (DETROL  LA) 4 MG 24 hr capsule Take 1 capsule (4 mg total) by mouth daily. Patient not taking: Reported on 04/17/2023 03/01/23   Gaston Hamilton, MD    Allergies:   Allergies  Allergen Reactions   Sulfa  Antibiotics Other (See Comments)    Mouth sore and raw    Social History:  reports that she quit smoking about 28 years ago. Her smoking use included cigarettes. She started smoking about 58 years ago. She has a 45 pack-year smoking history. She has been exposed to tobacco smoke. She has never used smokeless tobacco.  She reports that she does not currently use alcohol after a past usage of about 4.0 standard drinks of alcohol per week. She reports that she does not use drugs.  Family History: Family History  Problem Relation Age of Onset   Diabetes Mother    Heart attack Mother    Cancer Sister        cancer of eyes, spread to lung, other places. died at 78   Cancer Sister 2       unknown primary, spread to bone, brain died in 90's   Cancer Paternal Aunt        unk type   Cancer Paternal Grandmother        type unk   Prostate cancer Neg Hx    Kidney cancer Neg Hx    Bladder Cancer Neg Hx    Breast cancer Neg Hx     Physical Exam: Vitals:   05/06/23 2244 05/06/23 2245  BP: 134/82   Pulse: (!) 102   Resp: (!) 21   Temp: 98.2 F (36.8 C)   TempSrc: Oral   SpO2: 99%   Weight:  52.4 kg  Height:  5' 1 (1.549 m)    General: A and O x 3, petite cachectic female, no acute distress Eyes: Pink conjunctiva, no scleral icterus ENT: Moist oral mucosa, no teeth.  Multiple nodules on neck -chronic Lungs: CTA B/L, no wheeze, no crackles, no use of accessory muscles Cardiovascular: RRR, no regurgitation, no bruits, no JVD Abdomen: soft, positive BS, NTND, no organomegaly, not an acute abdomen GU: not examined Neuro: CN II - XII grossly intact, sensation intact Musculoskeletal: RLE swollen, erythematous Skin: no rash, no subcutaneous crepitation, no decubitus Psych: appropriate patient  Labs on Admission:  Recent Labs    05/07/23 0118  NA 134*  K 3.2*  CL 97*  CO2 24  GLUCOSE 99  BUN 9  CREATININE 0.82  CALCIUM  8.6*   Recent Labs    05/07/23 0118  AST 22  ALT 14  ALKPHOS 69  BILITOT 0.7  PROT  5.8*  ALBUMIN  2.9*    Recent Labs    05/07/23 0118  WBC 2.7*  NEUTROABS 1.7  HGB 9.9*  HCT 30.9*  MCV 101.0*  PLT 204    Radiological Exams on Admission: DG Tibia/Fibula Left Result Date: 05/06/2023 CLINICAL DATA:  Blunt trauma to the lower left leg, initial encounter EXAM:  LEFT TIBIA AND FIBULA - 2 VIEW COMPARISON:  None Available. FINDINGS: Mild soft tissue swelling is noted. No acute fracture or dislocation is seen. IMPRESSION: Swelling without acute bony abnormality. Electronically Signed   By: Oneil Devonshire M.D.   On: 05/06/2023 23:18    Assessment/Plan Present on Admission:  Cellulitis RLE -Cellulitis treated with p.o. Levaquin , day number 6.  Will give additional day of antibiotics IV given her continued swelling and drainage plus her immunocompromise status from chemotherapy.SABRA   RLE DVT -Continue Eliquis , starter pack   Non-small cell cancer //  immunocompromise due to chemotherapy -On chemotherapy radiation therapy.  Per oncologist Dr. Melanee   Dysphagia -Patient finds it difficult to swallow solids.  Dysphagia mechanical soft diet.  Ordered.  Ensure with meals.   Cough  COPD -Tessalon  Perles scheduled, Robitussin PRN. -Nebulizes as needed   Chronic pain -Continue fentanyl  patch and Roxicodone  as needed   History of breast cancer  History of vocal cord cancer  History of adrenal cancer of unknown primary  History of skin cancer   Bobbie Valletta 05/07/2023, 2:27 AM

## 2023-05-07 NOTE — Progress Notes (Addendum)
 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   Patient taking chemo weekly for 6 cycles.  Patient is on radiation 5 days a week for 6 cycles.  She is currently in week #4.  Her oncologist is Dr. Melanee.   Patient diagnosed with RLE cellulitis at her oncologist office on 12/30.  Started on Levaquin  500 mg daily for 7 days.  On follow-up visit 1/2 patient return to oncologist office with RLE swelling ultrasound positive for DVT.  Started on Eliquis  Dosepak.  She has been compliant with medications.  Despite this the swelling in the right lower extremity is continued to worsen.  The yesterday she started draining particles of clear fluid on the floor.  She denies fever, endorses chills.  She endorses nausea but no vomiting.  She additionally has a dry cough.  Assessment & Plan:   Principal Problem:   Sepsis due to cellulitis [facility]) Active Problems:   Malignant neoplasm of lower-outer quadrant of right breast of female, estrogen receptor negative (HCC)   Coronary artery disease of native artery of native heart with stable angina pectoris (HCC)   GAD (generalized anxiety disorder)   PAF (paroxysmal atrial fibrillation) (HCC)   Non-small cell lung cancer (HCC)   Chronic kidney disease, stage 2, mildly decreased GFR   DVT (deep venous thrombosis) (HCC)  # LLE DVT PVL 1/2 showing posterior tibial DVT. Started on apixaban  - continue apixaban  - compression dressing to  LLE  # LLE cellulitis Discussed w/ Dr. Serene of vascular, although swelling/erythema can be explained by DVT, appearance favors cellulitis. No fever or elevated procal. Failed course of outpatient vancomycin . Started on vancomycin  and ceftriaxone  in the ER. No apparent abscess. - monitor blood cultures - will cover for mrsa, which we didn't get w/ levaquin . Start doxy/duricef  # Rash Scattered papular/petechial rash began several weeks ago. Follows with Dr. Dela with [facility], says she saw him for this last week. Rash thought to be 2/2 paclitaxel  per onc - close outpatient f/u  # NSCLC Currently undergoing chemotherapy, s/p 4 cycles carbopaclitaxel - outpt onc f/u  # Hx a-fib Not rate controlled - cont apixaban  - monitor  # Chronic cancer pain - cont home fentanyl  patch, oxy  # CAD, non-obstructive - cont home statin   DVT prophylaxis: apixaban  Code Status: full Family Communication: husband joseph updated  Level of care: Telemetry Medical Status is: Inpatient Remains inpatient appropriate because: need for IV abx    Consultants:  none  Procedures: none  Antimicrobials:  Ceftriaxone /vancomycin >    Subjective: Reports some pain LLE  Objective: Vitals:   05/06/23 2244 05/06/23 2245 05/07/23 0528  BP: 134/82  130/84  Pulse: (!) 102  99  Resp: (!) 21  20  Temp: 98.2 F (36.8 C)  98.4 F (36.9 C)  TempSrc: Oral  Oral  SpO2: 99%  99%  Weight:  52.4 kg   Height:  5' 1 (1.549 m)    No intake or output data in the 24 hours ending 05/07/23  9156 Filed Weights   05/06/23 2245  Weight: 52.4 kg    Examination:  General exam: Appears calm and comfortable, chronically ill. Raspy voice Respiratory system: Clear to auscultation. Respiratory effort normal. Cardiovascular system: S1 & S2 heard, RRR.   Gastrointestinal system: Abdomen is nondistended, soft and nontender. No organomegaly or masses felt. Normal bowel sounds heard. Central nervous  system: Alert and oriented. No focal neurological deficits. Extremities: Symmetric 5 x 5 power. Skin: scattered petechia and erythematous papules  Psychiatry: Judgement and insight appear normal. Mood & affect appropriate.     Data Reviewed: I have personally reviewed following labs and imaging studies  CBC: Recent Labs  Lab 05/01/23 0924 05/07/23 0118 05/07/23 0540  WBC 5.7 2.7* 2.3*  NEUTROABS 4.4 1.7 1.4*  HGB 10.9* 9.9* 9.6*  HCT 32.8* 30.9* 29.1*  MCV 97.9 101.0* 99.3  PLT 163 204 205   Basic Metabolic Panel: Recent Labs  Lab 05/01/23 0924 05/07/23 0118 05/07/23 0540  NA 136 134* 137  K 3.4* 3.2* 3.3*  CL 99 97* 100  CO2 25 24 24   GLUCOSE 92 99 89  BUN 11 9 7*  CREATININE 0.68 0.82 0.71  CALCIUM  8.9 8.6* 8.4*   GFR: Estimated Creatinine Clearance: 50.1 mL/min (by C-G formula based on SCr of 0.71 mg/dL). Liver Function Tests: Recent Labs  Lab 05/01/23 0924 05/07/23 0118  AST 27 22  ALT 17 14  ALKPHOS 69 69  BILITOT 0.6 0.7  PROT 6.2* 5.8*  ALBUMIN  3.2* 2.9*   No results for input(s): LIPASE, AMYLASE in the last 168 hours. No results for input(s): AMMONIA in the last 168 hours. Coagulation Profile: No results for input(s): INR, PROTIME in the last 168 hours. Cardiac Enzymes: No results for input(s): CKTOTAL, CKMB, CKMBINDEX, TROPONINI in the last 168 hours. BNP (last 3 results) No results for input(s): PROBNP in the last 8760 hours. HbA1C: No results for input(s): HGBA1C in the last 72 hours. CBG: No results for input(s): GLUCAP in the last 168 hours. Lipid Profile: No results for input(s): CHOL, HDL, LDLCALC, TRIG, CHOLHDL, LDLDIRECT in the last 72 hours. Thyroid  Function Tests: No results for input(s): TSH, T4TOTAL, FREET4, T3FREE, THYROIDAB in the last 72 hours. Anemia Panel: No results for input(s): VITAMINB12, FOLATE, FERRITIN, TIBC, IRON , RETICCTPCT in the last 72 hours. Urine  analysis:    Component Value Date/Time   COLORURINE STRAW (A) 12/06/2022 1741   APPEARANCEUR Clear 02/27/2023 0959   LABSPEC 1.005 12/06/2022 1741   PHURINE 7.0 12/06/2022 1741   GLUCOSEU Negative 02/27/2023 0959   HGBUR NEGATIVE 12/06/2022 1741   BILIRUBINUR Negative 02/27/2023 0959   KETONESUR NEGATIVE 12/06/2022 1741   PROTEINUR Negative 02/27/2023 0959   PROTEINUR NEGATIVE 12/06/2022 1741   UROBILINOGEN 0.2 11/05/2021 1736   NITRITE Negative 02/27/2023 0959   NITRITE NEGATIVE 12/06/2022 1741   LEUKOCYTESUR Negative 02/27/2023 0959   LEUKOCYTESUR NEGATIVE 12/06/2022 1741   Sepsis Labs: @LABRCNTIP (procalcitonin:4,lacticidven:4)  ) Recent Results (from the past 240 hours)  Blood culture (routine x 2)     Status: None (Preliminary result)   Collection Time: 05/07/23  1:18 AM   Specimen: BLOOD LEFT ARM  Result Value Ref Range Status   Specimen Description BLOOD LEFT ARM  Final   Special Requests   Final    BOTTLES DRAWN AEROBIC AND ANAEROBIC Blood Culture results may not be optimal due to an inadequate volume of blood received in culture bottles   Culture   Final    NO GROWTH < 12 HOURS  Performed at [facility], [address] [place], [place] [address]    Report Status PENDING  Incomplete  Blood culture (routine x 2)     Status: None (Preliminary result)   Collection Time: 05/07/23  1:18 AM   Specimen: BLOOD LEFT ARM  Result Value Ref Range Status   Specimen Description BLOOD LEFT ARM  Final   Special Requests   Final    BOTTLES DRAWN AEROBIC AND ANAEROBIC Blood Culture adequate volume   Culture   Final    NO GROWTH < 12 HOURS Performed at [facility], [address], [place], [place] [address]    Report Status PENDING  Incomplete         Radiology Studies: DG Tibia/Fibula Left Result Date: 05/06/2023 CLINICAL DATA:  Blunt trauma to the lower left leg, initial encounter EXAM: LEFT TIBIA AND FIBULA - 2 VIEW COMPARISON:  None Available.  FINDINGS: Mild soft tissue swelling is noted. No acute fracture or dislocation is seen. IMPRESSION: Swelling without acute bony abnormality. Electronically Signed   By: Oneil Devonshire M.D.   On: 05/06/2023 23:18        Scheduled Meds:  apixaban   10 mg Oral BID   Followed by   NOREEN ON 05/13/2023] apixaban   5 mg Oral BID   benzonatate   100 mg Oral Q8H   [START ON 05/09/2023] fentaNYL   1 patch Transdermal Q72H   omeprazole   40 mg Oral Daily   sucralfate   1 g Oral TID WC & HS   Continuous Infusions:  [START ON 05/08/2023] cefTRIAXone  (ROCEPHIN )  IV       LOS: 0 days     Devaughn KATHEE Ban, MD [facility]   If 7PM-7AM, please contact night-coverage [email or web address] Password [facility] 05/07/2023, 8:43 AM

## 2023-05-07 NOTE — Progress Notes (Signed)
 CODE SEPSIS - PHARMACY COMMUNICATION  **Broad Spectrum Antibiotics should be administered within 1 hour of Sepsis diagnosis**  Time Code Sepsis Called/Page Received: 1/5 @ 0028   Antibiotics Ordered:  Ceftriaxone  , Vancomycin    Time of 1st antibiotic administration:  Ceftriaxone  2 gm IV X 1 on 1/05 @ 0124   Additional action taken by pharmacy:   If necessary, Name of Provider/Nurse Contacted:     Johnye Kist D ,PharmD Clinical Pharmacist  05/07/2023  1:38 AM

## 2023-05-07 NOTE — Progress Notes (Signed)
 Pt being followed by [facility] for Sepsis protocol.

## 2023-05-07 NOTE — Progress Notes (Signed)
 PHARMACY - ANTICOAGULATION CONSULT NOTE  Pharmacy Consult for Eliquis   Indication: DVT   Allergies  Allergen Reactions   Sulfa  Antibiotics Other (See Comments)    Mouth sore and raw    Patient Measurements: Height: 5' 1 (154.9 cm) Weight: 52.4 kg (115 lb 9.6 oz) IBW/kg (Calculated) : 47.8 Heparin  Dosing Weight:   Vital Signs: Temp: 98.2 F (36.8 C) (01/04 2244) Temp Source: Oral (01/04 2244) BP: 134/82 (01/04 2244) Pulse Rate: 102 (01/04 2244)  Labs: Recent Labs    05/07/23 0118  HGB 9.9*  HCT 30.9*  PLT 204  CREATININE 0.82    Estimated Creatinine Clearance: 48.9 mL/min (by C-G formula based on SCr of 0.82 mg/dL).   Medical History: Past Medical History:  Diagnosis Date   Adenomatous colon polyp    Adrenal carcinoma, right (HCC) 12/28/2016   a.) high grade carcinoma with extensive necrosis --> s/p adrenalectomy   Anxiety    Aortic atherosclerosis (HCC)    Arthritis of right knee    Atrial fibrillation and flutter (HCC)    a.) CHA2DS2-VASc = 3 (age, sex, vascular disease history) as of 02/13/2023; b.) cardiac rate/rhythm maintained intrinsically without pharmacological intervention; no chronic OAC (does take low dose ASA)   B12 deficiency    Breast cancer (HCC) 1997   a.) stage IB (cT1a, cN0, cM0, G3, ER+, PR-, HER2-); s/p lumpectomy + XRT   COPD (chronic obstructive pulmonary disease) (HCC)    Coronary artery disease    a.) cCTA 12/09/2021: Ca2+ = 135 (80th %'ile for age/sex/race match contol) --> 25-49% pLAD and RCA distributions   Depression    Diastolic dysfunction    a.) TTE 12/11/2020, no RWMAs, norm RVSF, G1DD; b.) TTE 12/14/2021: EF 55-60, no RWMAs, norm RVSF, G1DD; c.) TTE 01/27/2023: EF 50-55%, no RWMAs, G1DD, mild RAE, mild MR, mod TR   Dry cough 03/02/2017   Dyspnea    Full dentures    GERD (gastroesophageal reflux disease)    History of hiatal hernia    HLD (hyperlipidemia)    Invasive carcinoma of breast (HCC) 11/15/2017   a.) path (+)  for G3, ER -, PR -, HER2/neu - ; s/p lumpectomy 01/17/2018 + 4 cycles TC systemic chemotherapy + XRT   Long term current use of aspirin     Personal history of chemotherapy    Personal history of radiation therapy    Pneumothorax of left lung after biopsy 01/25/2023   a.) hypoxic in PACU requiring re-intubation --> transferred to ICU --> chest tube placed by PCCM   RLS (restless legs syndrome)    a.) on ropinirole    Skin cancer    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  Per MD note,  pt started on Eliquis  for DVT on 1/4.   Goal of Therapy:  Resolution of DVT    Plan:  Pt has already received Day 1 of treatment dose for DVT. Eliquis  10 mg PO BID X 6 days ordered to start on 1/5 @ 1000 followed by 5 mg PO BID.  Kory Rains D 05/07/2023,3:45 AM

## 2023-05-07 NOTE — Progress Notes (Signed)
 ED Pharmacy Antibiotic Sign Off An antibiotic consult was received from an ED provider for vancomycin  per pharmacy dosing for sepsis. A chart review was completed to assess appropriateness.   The following one time order(s) were placed:  Vancomycin  1250 mg IV X 1.   Further antibiotic and/or antibiotic pharmacy consults should be ordered by the admitting provider if indicated.   Thank you for allowing pharmacy to be a part of this patient's care.   Silver Selinda [facility]  Clinical Pharmacist 05/07/23 12:48 AM

## 2023-05-07 NOTE — Hospital Course (Signed)
 H/o breast cancer sp surgery and radiation, carcinoma of the vocal cord sp surgery and radiation, adrenal carcinoma of unknown primary, now with non-small cell lung cancer, COPD, CAD, anxiety and depression, atrial fibrillation/flutter, HLD, RLS.

## 2023-05-08 ENCOUNTER — Ambulatory Visit: Payer: Medicare HMO

## 2023-05-08 ENCOUNTER — Other Ambulatory Visit: Payer: Medicare HMO

## 2023-05-08 ENCOUNTER — Inpatient Hospital Stay: Payer: Medicare HMO

## 2023-05-08 ENCOUNTER — Inpatient Hospital Stay (HOSPITAL_BASED_OUTPATIENT_CLINIC_OR_DEPARTMENT_OTHER): Payer: Medicare HMO | Admitting: Nurse Practitioner

## 2023-05-08 ENCOUNTER — Ambulatory Visit: Payer: Medicare HMO | Admitting: Nurse Practitioner

## 2023-05-08 DIAGNOSIS — A419 Sepsis, unspecified organism: Secondary | ICD-10-CM | POA: Diagnosis not present

## 2023-05-08 DIAGNOSIS — L0390 Cellulitis, unspecified: Secondary | ICD-10-CM | POA: Diagnosis not present

## 2023-05-08 DIAGNOSIS — C3492 Malignant neoplasm of unspecified part of left bronchus or lung: Secondary | ICD-10-CM

## 2023-05-08 MED ORDER — DOXYCYCLINE HYCLATE 100 MG PO TABS: 100.0000 mg | ORAL_TABLET | Freq: Two times a day (BID) | ORAL | 0 refills | Status: AC

## 2023-05-08 MED ORDER — CEFADROXIL 500 MG PO CAPS: 1000.0000 mg | ORAL_CAPSULE | Freq: Two times a day (BID) | ORAL | 0 refills | Status: AC

## 2023-05-08 NOTE — Discharge Summary (Signed)
 Denise Watts FMW:[id number] DOB: 1955-07-11 DOA: 05/06/2023  PCP: [facility]  Admit date: 05/06/2023 Discharge date: 05/08/2023  Time spent: 35 minutes  Recommendations for Outpatient Follow-up:  Pcp and/or onc f/u within a week to check on leg     Discharge Diagnoses:  Principal Problem:   Sepsis due to cellulitis [facility]) Active Problems:   Malignant neoplasm of lower-outer quadrant of right breast of female, estrogen receptor negative (HCC)   Coronary artery disease of native artery of native heart with stable angina pectoris (HCC)   GAD (generalized anxiety disorder)   PAF (paroxysmal atrial fibrillation) (HCC)   Non-small cell lung cancer (HCC)   Chronic kidney disease, stage 2, mildly decreased GFR   DVT (deep venous thrombosis) (HCC)   Discharge Condition: improved  Diet recommendation: regular  Filed Weights   05/06/23 2245  Weight: 52.4 kg    History of present illness:  From admission h and p This is a 68 year old female with past medical history significant for COPD, CAD, anxiety and depression, atrial fibrillation/flutter, HDS, RLS, dysphagia, h/o breast cancer sp surgery and radiation, carcinoma of the vocal cord sp surgery and radiation (subsequent dysphagia), adrenal carcinoma of unknown primary, now with non-small cell lung cancer now on chemotherapy and radiation therapy.   Patient taking chemo weekly for 6 cycles.  Patient is on radiation 5 days a week for 6 cycles.  She is currently in week #4.  Her oncologist is Dr. Melanee.   Patient diagnosed with RLE cellulitis at her oncologist office on 12/30.  Started on Levaquin  500 mg daily for 7 days.  On follow-up visit 1/2 patient return to oncologist office with RLE swelling ultrasound positive for DVT.  Started on Eliquis  Dosepak.  She has been compliant with medications.  Despite this the swelling in the right lower extremity is continued to worsen.  The yesterday she started draining particles of clear fluid  on the floor.  She denies fever, endorses chills.  She endorses nausea but no vomiting.  She additionally has a dry cough.    Hospital Course:  Hx malignancy with recent onset of RLE erythema and swelling, started on levaquin  as outpt for presumed cellulitis, failed to improve so PVL obtained showing acute DVT, presenting with worsening erythema and swelling of that lower extremity. No systemic signs/symptoms. Reviewed with vascular surgery and ID. Symptoms most likely secondary to the acute dvt and so continued anticoagulation, compression therapy, and leg elevation are advised and were instituted here. Vascular did advise covering for possible cellulitis and so patient was treated with antibiotics here. Pain/erythema/swelling improved. Will discharge to continue compression therapy, leg elevation, apixaban , and will finish a course of doxy/duricef (this will give mrsa coverage which the levaquin  did not provide). Advise close f/u with oncology or pcp, can also f/u with vascular surgery particularly should ongoing phlebitis persist.   Procedures: none    Discharge Exam: Vitals:   05/08/23 0800 05/08/23 0849  BP: 119/83   Pulse: (!) 116   Resp: 16   Temp:  97.6 F (36.4 C)  SpO2: 100%     General: NAD Cardiovascular: mild tachycardia, rr Respiratory: ctab Ext: LLE wrapped, interval improvement in erythema and swelling  Discharge Instructions   Discharge Instructions     Diet - low sodium heart healthy   Complete by: As directed    Increase activity slowly   Complete by: As directed       Allergies as of 05/08/2023  Reactions   Sulfa  Antibiotics Other (See Comments)   Mouth sore and raw        Medication List     TAKE these medications    Apixaban  Starter Pack (10mg  and 5mg ) Commonly known as: ELIQUIS  STARTER PACK Take as directed on package: start with two-5mg  tablets twice daily for 7 days. On day 8, switch to one-5mg  tablet twice daily.   Eliquis  5 MG Tabs  tablet Generic drug: apixaban  Take 5 mg by mouth 2 (two) times daily.   benzonatate  100 MG capsule Commonly known as: TESSALON  Take 2 capsules (200 mg total) by mouth 2 (two) times daily as needed for cough.   cefadroxil  500 MG capsule Commonly known as: DURICEF Take 2 capsules (1,000 mg total) by mouth 2 (two) times daily for 6 days.   doxycycline  100 MG tablet Commonly known as: VIBRA -TABS Take 1 tablet (100 mg total) by mouth every 12 (twelve) hours for 6 days.   fentaNYL  12 MCG/HR Commonly known as: DURAGESIC  Place 1 patch onto the skin every 3 (three) days.   fluconazole  10 MG/ML suspension Commonly known as: DIFLUCAN  TAKE 10 ML (100 MG TOTAL) BY MOUTH DAILY   lidocaine  5 % Commonly known as: LIDODERM  Place 1 patch onto the skin daily. Remove & Discard patch within 12 hours or as directed by MD   lidocaine -prilocaine  cream Commonly known as: EMLA  Apply to affected area once   omeprazole  2 mg/mL Susp oral suspension Commonly known as: KONVOMEP  Take 20 mLs (40 mg total) by mouth daily.   ondansetron  4 MG disintegrating tablet Commonly known as: ZOFRAN -ODT Take 1 tablet (4 mg total) by mouth every 8 (eight) hours as needed for nausea or vomiting.   oxyCODONE  5 MG/5ML solution Commonly known as: ROXICODONE  Take 5 mLs (5 mg total) by mouth every 4 (four) hours as needed for severe pain (pain score 7-10).   rosuvastatin  40 MG tablet Commonly known as: CRESTOR  TAKE 1 TABLET BY MOUTH DAILY   sucralfate  1 GM/10ML suspension Commonly known as: Carafate  Take 10 mLs (1 g total) by mouth 4 (four) times daily -  with meals and at bedtime.       Allergies  Allergen Reactions   Sulfa  Antibiotics Other (See Comments)    Mouth sore and raw    Follow-up Information     Melanee Annah BROCKS, MD Follow up.   Specialty: Oncology Contact information: [address] [place] [address] [phone]         [facility] Follow up.   Contact  information: [address] [place] [address] [phone]                  The results of significant diagnostics from this hospitalization (including imaging, microbiology, ancillary and laboratory) are listed below for reference.    Significant Diagnostic Studies: DG Tibia/Fibula Left Result Date: 05/06/2023 CLINICAL DATA:  Blunt trauma to the lower left leg, initial encounter EXAM: LEFT TIBIA AND FIBULA - 2 VIEW COMPARISON:  None Available. FINDINGS: Mild soft tissue swelling is noted. No acute fracture or dislocation is seen. IMPRESSION: Swelling without acute bony abnormality. Electronically Signed   By: Oneil Devonshire M.D.   On: 05/06/2023 23:18   US  Venous Img Lower Unilateral Left Result Date: 05/04/2023 CLINICAL DATA:  Left lower extremity edema. EXAM: Left LOWER EXTREMITY VENOUS DOPPLER ULTRASOUND TECHNIQUE: Gray-scale sonography with graded compression, as well as color Doppler and duplex ultrasound were performed to evaluate the lower  extremity deep venous systems from the level of the common femoral vein and including the common femoral, femoral, profunda femoral, popliteal and calf veins including the posterior tibial, peroneal and gastrocnemius veins when visible. The superficial great saphenous vein was also interrogated. Spectral Doppler was utilized to evaluate flow at rest and with distal augmentation maneuvers in the common femoral, femoral and popliteal veins. COMPARISON:  None Available. FINDINGS: Contralateral Common Femoral Vein: Respiratory phasicity is normal and symmetric with the symptomatic side. No evidence of thrombus. Normal compressibility. Common Femoral Vein: No evidence of thrombus. Normal compressibility, respiratory phasicity and response to augmentation. Saphenofemoral Junction: No evidence of thrombus. Normal compressibility and flow on color Doppler imaging. Profunda Femoral Vein: No evidence of thrombus. Normal compressibility and flow on  color Doppler imaging. Femoral Vein: No evidence of thrombus. Normal compressibility, respiratory phasicity and response to augmentation. Popliteal Vein: No evidence of thrombus. Normal compressibility, respiratory phasicity and response to augmentation. Calf Veins: Poor compression along the posterior tibial vein and poor flow on Doppler. Superficial Great Saphenous Vein: No evidence of thrombus. Normal compressibility. Venous Reflux:  None. Other Findings: Small fluid collection along the popliteal fossa measuring 2.2 x 1.3 x 0.7 cm. IMPRESSION: Small below-the-knee thrombus along the posterior tibial vein. No evidence of above the knee DVT. Small fluid collection in the popliteal fossa measuring 2.2 x 0.7 x 1.3 cm. Baker's cysts is possible but there is differential. Please correlate with any known history or additional evaluation as clinically appropriate Electronically Signed   By: Ranell Bring M.D.   On: 05/04/2023 12:07    Microbiology: Recent Results (from the past 240 hours)  Blood culture (routine x 2)     Status: None (Preliminary result)   Collection Time: 05/07/23  1:18 AM   Specimen: BLOOD LEFT ARM  Result Value Ref Range Status   Specimen Description BLOOD LEFT ARM  Final   Special Requests   Final    BOTTLES DRAWN AEROBIC AND ANAEROBIC Blood Culture results may not be optimal due to an inadequate volume of blood received in culture bottles   Culture   Final    NO GROWTH 1 DAY Performed at [facility], [address] [place], [place] [address]    Report Status PENDING  Incomplete  Blood culture (routine x 2)     Status: None (Preliminary result)   Collection Time: 05/07/23  1:18 AM   Specimen: BLOOD LEFT ARM  Result Value Ref Range Status   Specimen Description BLOOD LEFT ARM  Final   Special Requests   Final    BOTTLES DRAWN AEROBIC AND ANAEROBIC Blood Culture adequate volume   Culture   Final    NO GROWTH 1 DAY Performed at [facility], [address] [place], [place] [address]    Report Status PENDING  Incomplete     Labs: Basic Metabolic Panel: Recent Labs  Lab 05/07/23 0118 05/07/23 0540  NA 134* 137  K 3.2* 3.3*  CL 97* 100  CO2 24 24  GLUCOSE 99 89  BUN 9 7*  CREATININE 0.82 0.71  CALCIUM  8.6* 8.4*   Liver Function Tests: Recent Labs  Lab 05/07/23 0118  AST 22  ALT 14  ALKPHOS 69  BILITOT 0.7  PROT 5.8*  ALBUMIN  2.9*   No results for input(s): LIPASE, AMYLASE in the last 168 hours. No results for input(s): AMMONIA in the last 168 hours. CBC: Recent Labs  Lab 05/07/23 0118 05/07/23 0540  WBC 2.7* 2.3*  NEUTROABS 1.7 1.4*  HGB 9.9* 9.6*  HCT 30.9* 29.1*  MCV 101.0* 99.3  PLT 204 205   Cardiac Enzymes: No results for input(s): CKTOTAL, CKMB, CKMBINDEX, TROPONINI in the last 168 hours. BNP: BNP (last 3 results) No results for input(s): BNP in the last 8760 hours.  ProBNP (last 3 results) No results for input(s): PROBNP in the last 8760 hours.  CBG: No results for input(s): GLUCAP in the last 168 hours.     Signed:  Devaughn KATHEE Ban MD.  [facility] 05/08/2023, 10:56 AM

## 2023-05-09 ENCOUNTER — Other Ambulatory Visit: Payer: Self-pay

## 2023-05-09 ENCOUNTER — Encounter: Payer: Self-pay | Admitting: Oncology

## 2023-05-09 ENCOUNTER — Ambulatory Visit
Admission: RE | Admit: 2023-05-09 | Discharge: 2023-05-09 | Disposition: A | Discharge status: Home / Self Care | Payer: Medicare HMO | Source: Ambulatory Visit | Attending: Radiation Oncology | Admitting: Radiation Oncology

## 2023-05-09 ENCOUNTER — Telehealth: Payer: Self-pay | Admitting: *Deleted

## 2023-05-09 DIAGNOSIS — Z5111 Encounter for antineoplastic chemotherapy: Secondary | ICD-10-CM | POA: Diagnosis not present

## 2023-05-09 DIAGNOSIS — C50511 Malignant neoplasm of lower-outer quadrant of right female breast: Secondary | ICD-10-CM | POA: Diagnosis not present

## 2023-05-09 DIAGNOSIS — Z853 Personal history of malignant neoplasm of breast: Secondary | ICD-10-CM | POA: Diagnosis not present

## 2023-05-09 DIAGNOSIS — Z510 Encounter for antineoplastic radiation therapy: Secondary | ICD-10-CM | POA: Diagnosis not present

## 2023-05-09 DIAGNOSIS — Z87891 Personal history of nicotine dependence: Secondary | ICD-10-CM | POA: Diagnosis not present

## 2023-05-09 DIAGNOSIS — C3412 Malignant neoplasm of upper lobe, left bronchus or lung: Secondary | ICD-10-CM | POA: Diagnosis not present

## 2023-05-09 DIAGNOSIS — L270 Generalized skin eruption due to drugs and medicaments taken internally: Secondary | ICD-10-CM | POA: Diagnosis not present

## 2023-05-09 DIAGNOSIS — B379 Candidiasis, unspecified: Secondary | ICD-10-CM | POA: Diagnosis not present

## 2023-05-09 DIAGNOSIS — K208 Other esophagitis without bleeding: Secondary | ICD-10-CM | POA: Diagnosis not present

## 2023-05-09 DIAGNOSIS — R12 Heartburn: Secondary | ICD-10-CM | POA: Diagnosis not present

## 2023-05-09 LAB — RAD ONC ARIA SESSION SUMMARY
Course Elapsed Days: 48
Plan Fractions Treated to Date: 22
Plan Prescribed Dose Per Fraction: 2 Gy
Plan Total Fractions Prescribed: 33
Plan Total Prescribed Dose: 66 Gy
Reference Point Dosage Given to Date: 44 Gy
Reference Point Session Dosage Given: 2 Gy
Session Number: 22

## 2023-05-09 NOTE — Transitions of Care (Post Inpatient/ED Visit) (Signed)
 05/09/2023  Name: Denise Watts MRN: [id number] DOB: [birth year]  Today's TOC FU Call Status: Today's TOC FU Call Status:: Successful TOC FU Call Completed TOC FU Call Complete Date: 05/09/23 Patient's Name and Date of Birth confirmed.  Transition Care Management Follow-up Telephone Call Date of Discharge: 05/08/23 Discharge Facility: [facility]) Type of Discharge: Inpatient Admission Primary Inpatient Discharge Diagnosis:: Cellulitis of left lower extremity How have you been since you were released from the hospital?: Better Any questions or concerns?: Yes Patient Questions/Concerns:: My fingers are so dry and cracking. Patient Questions/Concerns Addressed: Other: (Care guide scheduled follow up appt)  Items Reviewed: Did you receive and understand the discharge instructions provided?: Yes Medications obtained,verified, and reconciled?: Yes (Medications Reviewed) Any new allergies since your discharge?: No Dietary orders reviewed?: Yes Type of Diet Ordered:: low sodium heart healthy Do you have support at home?: Yes People in Home: spouse Name of Support/Comfort Primary Source: Fairy  Medications Reviewed Today: Medications Reviewed Today     Reviewed by Kennieth Cathlean DEL, RN (Case Manager) on 05/09/23 at 1432  Med List Status: <None>   Medication Order Taking? Sig Documenting Provider Last Dose Status Informant  APIXABAN  (ELIQUIS ) VTE STARTER PACK (10MG  AND 5MG ) [id number] Yes Take as directed on package: start with two-5mg  tablets twice daily for 7 days. On day 8, switch to one-5mg  tablet twice daily. Borders, Fonda SAUNDERS, NP Taking Active Pharmacy Records, Self  benzonatate  (TESSALON ) 100 MG capsule [id number] Yes Take 2 capsules (200 mg total) by mouth 2 (two) times daily as needed for cough. Emilio Kelly DASEN, FNP Taking Active Pharmacy Records, Self  cefadroxil  (DURICEF) 500 MG capsule [id number] Yes Take 2 capsules (1,000 mg total) by mouth 2 (two)  times daily for 6 days. Wouk, Devaughn Sayres, MD Taking Active   doxycycline  (VIBRA -TABS) 100 MG tablet [id number] Yes Take 1 tablet (100 mg total) by mouth every 12 (twelve) hours for 6 days. Wouk, Devaughn Sayres, MD Taking Active   fentaNYL  (DURAGESIC ) 12 MCG/HR [id number] Yes Place 1 patch onto the skin every 3 (three) days. Dasie Tinnie MATSU, NP Taking Active Pharmacy Records, Self           Med Note [phone], ARLEY HERO   Sun May 07, 2023 11:43 AM) Still on [phone] 05-07-2023  fluconazole  (DIFLUCAN ) 10 MG/ML suspension [id number] Yes TAKE 10 ML (100 MG TOTAL) BY MOUTH DAILY Dasie Tinnie MATSU, NP Taking Active Pharmacy Records, Self  lidocaine  (LIDODERM ) 5 % [id number] Yes Place 1 patch onto the skin daily. Remove & Discard patch within 12 hours or as directed by MD [provider] Taking Active Pharmacy Records, Self  lidocaine -prilocaine  (EMLA ) cream [id number] Yes Apply to affected area once Melanee Annah BROCKS, MD Taking Active Pharmacy Records, Self  omeprazole  (KONVOMEP ) 2 mg/mL SUSP oral suspension [id number] Yes Take 20 mLs (40 mg total) by mouth daily. Borders, Fonda SAUNDERS, NP Taking Active Pharmacy Records, Self  ondansetron  (ZOFRAN -ODT) 4 MG disintegrating tablet [id number]  Take 1 tablet (4 mg total) by mouth every 8 (eight) hours as needed for nausea or vomiting. Borders, Fonda SAUNDERS, NP  Active Pharmacy Records, Self  oxyCODONE  (ROXICODONE ) 5 MG/5ML solution [id number] Yes Take 5 mLs (5 mg total) by mouth every 4 (four) hours as needed for severe pain (pain score 7-10). Borders, Fonda SAUNDERS, NP Taking Active Pharmacy Records, Self  rosuvastatin  (CRESTOR ) 40 MG tablet [id number] Yes TAKE 1 TABLET BY MOUTH DAILY Emilio Kelly DASEN, FNP Taking Active Pharmacy Records, Self  sucralfate  (  CARAFATE ) 1 GM/10ML suspension [id number] Yes Take 10 mLs (1 g total) by mouth 4 (four) times daily -  with meals and at bedtime. Borders, Fonda SAUNDERS, NP Taking Active Pharmacy Records, Self            Home Care and  Equipment/Supplies: Were Home Health Services Ordered?: NA Any new equipment or medical supplies ordered?: NA  Functional Questionnaire: Do you need assistance with bathing/showering or dressing?: Yes Do you need assistance with meal preparation?: Yes Do you need assistance with eating?: No Do you have difficulty maintaining continence: No Do you need assistance with getting out of bed/getting out of a chair/moving?: No Do you have difficulty managing or taking your medications?: No  Follow up appointments reviewed: PCP Follow-up appointment confirmed?: Yes Date of PCP follow-up appointment?: 05/18/23 Follow-up Provider: LOIS Wellington Driscilla Lionel Follow-up appointment confirmed?: Yes Date of Specialist follow-up appointment?: 05/14/22 Follow-Up Specialty Provider:: Dr Melanee Do you need transportation to your follow-up appointment?: No Do you understand care options if your condition(s) worsen?: Yes-patient verbalized understanding  SDOH Interventions Today    Flowsheet Row Most Recent Value  SDOH Interventions   Food Insecurity Interventions Intervention Not Indicated  Housing Interventions Intervention Not Indicated  Transportation Interventions Intervention Not Indicated, Patient Resources (Friends/Family)  Utilities Interventions Intervention Not Indicated      Interventions Today    Flowsheet Row Most Recent Value  Chronic Disease   Chronic disease during today's visit Other  [cellulitus of lt leg]  General Interventions   General Interventions Discussed/Reviewed General Interventions Discussed, General Interventions Reviewed, Doctor Visits  Doctor Visits Discussed/Reviewed Doctor Visits Discussed, Doctor Visits Reviewed, PCP, Specialist  PCP/Specialist Visits Compliance with follow-up visit  Education Interventions   Education Provided Provided Education  Provided Verbal Education On Other  [RN discussed keeping leg elevated]  Nutrition Interventions   Nutrition  Discussed/Reviewed Nutrition Discussed, Nutrition Reviewed  Pharmacy Interventions   Pharmacy Dicussed/Reviewed Pharmacy Topics Discussed, Pharmacy Topics Reviewed      Patient declined care Coordination services  Cathlean Headland BSN RN Population Health- Transition of Care Team.  [facility] [phone]

## 2023-05-09 NOTE — Progress Notes (Signed)
 Hospitalized

## 2023-05-10 ENCOUNTER — Emergency Department: Payer: Medicare HMO

## 2023-05-10 ENCOUNTER — Ambulatory Visit: Payer: Medicare HMO

## 2023-05-10 ENCOUNTER — Other Ambulatory Visit: Payer: Self-pay

## 2023-05-10 ENCOUNTER — Inpatient Hospital Stay
Admission: EM | Admit: 2023-05-10 | Discharge: 2023-05-13 | DRG: 163 | Disposition: A | Discharge status: Home / Self Care | Payer: Medicare HMO | Attending: Internal Medicine | Admitting: Internal Medicine

## 2023-05-10 DIAGNOSIS — Z79899 Other long term (current) drug therapy: Secondary | ICD-10-CM

## 2023-05-10 DIAGNOSIS — L03119 Cellulitis of unspecified part of limb: Secondary | ICD-10-CM | POA: Diagnosis not present

## 2023-05-10 DIAGNOSIS — E876 Hypokalemia: Secondary | ICD-10-CM | POA: Diagnosis present

## 2023-05-10 DIAGNOSIS — R042 Hemoptysis: Secondary | ICD-10-CM | POA: Diagnosis not present

## 2023-05-10 DIAGNOSIS — C3490 Malignant neoplasm of unspecified part of unspecified bronchus or lung: Secondary | ICD-10-CM | POA: Diagnosis not present

## 2023-05-10 DIAGNOSIS — E119 Type 2 diabetes mellitus without complications: Secondary | ICD-10-CM | POA: Diagnosis not present

## 2023-05-10 DIAGNOSIS — Z555 Less than a high school diploma: Secondary | ICD-10-CM

## 2023-05-10 DIAGNOSIS — L03115 Cellulitis of right lower limb: Secondary | ICD-10-CM | POA: Diagnosis present

## 2023-05-10 DIAGNOSIS — I5032 Chronic diastolic (congestive) heart failure: Secondary | ICD-10-CM | POA: Diagnosis present

## 2023-05-10 DIAGNOSIS — E785 Hyperlipidemia, unspecified: Secondary | ICD-10-CM | POA: Diagnosis not present

## 2023-05-10 DIAGNOSIS — I2693 Single subsegmental pulmonary embolism without acute cor pulmonale: Secondary | ICD-10-CM | POA: Diagnosis present

## 2023-05-10 DIAGNOSIS — Z87891 Personal history of nicotine dependence: Secondary | ICD-10-CM

## 2023-05-10 DIAGNOSIS — K219 Gastro-esophageal reflux disease without esophagitis: Secondary | ICD-10-CM | POA: Diagnosis present

## 2023-05-10 DIAGNOSIS — Z853 Personal history of malignant neoplasm of breast: Secondary | ICD-10-CM

## 2023-05-10 DIAGNOSIS — Z7982 Long term (current) use of aspirin: Secondary | ICD-10-CM

## 2023-05-10 DIAGNOSIS — Z7901 Long term (current) use of anticoagulants: Secondary | ICD-10-CM | POA: Diagnosis not present

## 2023-05-10 DIAGNOSIS — Z85828 Personal history of other malignant neoplasm of skin: Secondary | ICD-10-CM | POA: Diagnosis not present

## 2023-05-10 DIAGNOSIS — T451X5A Adverse effect of antineoplastic and immunosuppressive drugs, initial encounter: Secondary | ICD-10-CM | POA: Diagnosis present

## 2023-05-10 DIAGNOSIS — Z882 Allergy status to sulfonamides status: Secondary | ICD-10-CM

## 2023-05-10 DIAGNOSIS — I480 Paroxysmal atrial fibrillation: Secondary | ICD-10-CM | POA: Diagnosis present

## 2023-05-10 DIAGNOSIS — R000 Tachycardia, unspecified: Secondary | ICD-10-CM | POA: Diagnosis not present

## 2023-05-10 DIAGNOSIS — R600 Localized edema: Secondary | ICD-10-CM | POA: Diagnosis not present

## 2023-05-10 DIAGNOSIS — Z923 Personal history of irradiation: Secondary | ICD-10-CM | POA: Diagnosis not present

## 2023-05-10 DIAGNOSIS — I700 Atherosclerosis of aorta: Secondary | ICD-10-CM | POA: Diagnosis present

## 2023-05-10 DIAGNOSIS — Z85858 Personal history of malignant neoplasm of other endocrine glands: Secondary | ICD-10-CM

## 2023-05-10 DIAGNOSIS — Z833 Family history of diabetes mellitus: Secondary | ICD-10-CM

## 2023-05-10 DIAGNOSIS — D61818 Other pancytopenia: Secondary | ICD-10-CM | POA: Insufficient documentation

## 2023-05-10 DIAGNOSIS — J440 Chronic obstructive pulmonary disease with acute lower respiratory infection: Secondary | ICD-10-CM | POA: Diagnosis present

## 2023-05-10 DIAGNOSIS — F419 Anxiety disorder, unspecified: Secondary | ICD-10-CM | POA: Diagnosis present

## 2023-05-10 DIAGNOSIS — F32A Depression, unspecified: Secondary | ICD-10-CM | POA: Diagnosis present

## 2023-05-10 DIAGNOSIS — Z452 Encounter for adjustment and management of vascular access device: Secondary | ICD-10-CM | POA: Diagnosis not present

## 2023-05-10 DIAGNOSIS — M7989 Other specified soft tissue disorders: Secondary | ICD-10-CM | POA: Diagnosis not present

## 2023-05-10 DIAGNOSIS — J439 Emphysema, unspecified: Secondary | ICD-10-CM | POA: Diagnosis present

## 2023-05-10 DIAGNOSIS — F418 Other specified anxiety disorders: Secondary | ICD-10-CM | POA: Diagnosis present

## 2023-05-10 DIAGNOSIS — R918 Other nonspecific abnormal finding of lung field: Secondary | ICD-10-CM | POA: Diagnosis not present

## 2023-05-10 DIAGNOSIS — D61810 Antineoplastic chemotherapy induced pancytopenia: Secondary | ICD-10-CM | POA: Diagnosis present

## 2023-05-10 DIAGNOSIS — Z8521 Personal history of malignant neoplasm of larynx: Secondary | ICD-10-CM

## 2023-05-10 DIAGNOSIS — J449 Chronic obstructive pulmonary disease, unspecified: Secondary | ICD-10-CM | POA: Diagnosis present

## 2023-05-10 DIAGNOSIS — I2510 Atherosclerotic heart disease of native coronary artery without angina pectoris: Secondary | ICD-10-CM | POA: Diagnosis present

## 2023-05-10 DIAGNOSIS — I2699 Other pulmonary embolism without acute cor pulmonale: Principal | ICD-10-CM | POA: Diagnosis present

## 2023-05-10 DIAGNOSIS — I2609 Other pulmonary embolism with acute cor pulmonale: Secondary | ICD-10-CM

## 2023-05-10 DIAGNOSIS — Z86718 Personal history of other venous thrombosis and embolism: Secondary | ICD-10-CM

## 2023-05-10 DIAGNOSIS — R0602 Shortness of breath: Secondary | ICD-10-CM | POA: Diagnosis not present

## 2023-05-10 DIAGNOSIS — J441 Chronic obstructive pulmonary disease with (acute) exacerbation: Secondary | ICD-10-CM | POA: Diagnosis present

## 2023-05-10 DIAGNOSIS — Z8249 Family history of ischemic heart disease and other diseases of the circulatory system: Secondary | ICD-10-CM

## 2023-05-10 DIAGNOSIS — G2581 Restless legs syndrome: Secondary | ICD-10-CM | POA: Diagnosis present

## 2023-05-10 DIAGNOSIS — Z85118 Personal history of other malignant neoplasm of bronchus and lung: Secondary | ICD-10-CM | POA: Diagnosis not present

## 2023-05-10 HISTORY — DX: Other pulmonary embolism without acute cor pulmonale: I26.99

## 2023-05-10 LAB — CBC WITH DIFFERENTIAL/PLATELET
Abs Immature Granulocytes: 0.02 10*3/uL (ref 0.00–0.07)
Basophils Absolute: 0 10*3/uL (ref 0.0–0.1)
Basophils Relative: 1 %
Eosinophils Absolute: 0 10*3/uL (ref 0.0–0.5)
Eosinophils Relative: 0 %
HCT: 32.4 % — ABNORMAL LOW (ref 36.0–46.0)
Hemoglobin: 10.9 g/dL — ABNORMAL LOW (ref 12.0–15.0)
Immature Granulocytes: 1 %
Lymphocytes Relative: 8 %
Lymphs Abs: 0.2 10*3/uL — ABNORMAL LOW (ref 0.7–4.0)
MCH: 33.1 pg (ref 26.0–34.0)
MCHC: 33.6 g/dL (ref 30.0–36.0)
MCV: 98.5 fL (ref 80.0–100.0)
Monocytes Absolute: 0.2 10*3/uL (ref 0.1–1.0)
Monocytes Relative: 5 %
Neutro Abs: 2.8 10*3/uL (ref 1.7–7.7)
Neutrophils Relative %: 85 %
Platelets: 268 10*3/uL (ref 150–400)
RBC: 3.29 MIL/uL — ABNORMAL LOW (ref 3.87–5.11)
RDW: 15.8 % — ABNORMAL HIGH (ref 11.5–15.5)
WBC: 3.2 10*3/uL — ABNORMAL LOW (ref 4.0–10.5)
nRBC: 0 % (ref 0.0–0.2)

## 2023-05-10 LAB — COMPREHENSIVE METABOLIC PANEL
ALT: 16 U/L (ref 0–44)
AST: 31 U/L (ref 15–41)
Albumin: 3.3 g/dL — ABNORMAL LOW (ref 3.5–5.0)
Alkaline Phosphatase: 78 U/L (ref 38–126)
Anion gap: 13 (ref 5–15)
BUN: 11 mg/dL (ref 8–23)
CO2: 23 mmol/L (ref 22–32)
Calcium: 9.4 mg/dL (ref 8.9–10.3)
Chloride: 99 mmol/L (ref 98–111)
Creatinine, Ser: 0.71 mg/dL (ref 0.44–1.00)
GFR, Estimated: >60 mL/min (ref >60)
Glucose, Bld: 124 mg/dL — ABNORMAL HIGH (ref 70–99)
Potassium: 3.9 mmol/L (ref 3.5–5.1)
Sodium: 135 mmol/L (ref 135–145)
Total Bilirubin: 0.7 mg/dL (ref 0.0–1.2)
Total Protein: 6.5 g/dL (ref 6.5–8.1)

## 2023-05-10 LAB — URINALYSIS, W/ REFLEX TO CULTURE (INFECTION SUSPECTED)
Bilirubin Urine: NEGATIVE
Glucose, UA: 50 mg/dL — AB
Hgb urine dipstick: NEGATIVE
Ketones, ur: 5 mg/dL — AB
Leukocytes,Ua: NEGATIVE
Nitrite: NEGATIVE
Protein, ur: NEGATIVE mg/dL
Specific Gravity, Urine: 1.013 (ref 1.005–1.030)
pH: 5 (ref 5.0–8.0)

## 2023-05-10 LAB — PROTIME-INR
INR: 1.9 — ABNORMAL HIGH (ref 0.8–1.2)
Prothrombin Time: 22.3 s — ABNORMAL HIGH (ref 11.4–15.2)

## 2023-05-10 LAB — APTT
aPTT: >200 s (ref 24–36)
aPTT: 50 s — ABNORMAL HIGH (ref 24–36)

## 2023-05-10 LAB — HEPARIN LEVEL (UNFRACTIONATED): Heparin Unfractionated: >1.1 [IU]/mL — ABNORMAL HIGH (ref 0.30–0.70)

## 2023-05-10 LAB — TROPONIN I (HIGH SENSITIVITY): Troponin I (High Sensitivity): 5 ng/L (ref <18)

## 2023-05-10 LAB — LACTIC ACID, PLASMA: Lactic Acid, Venous: 1 mmol/L (ref 0.5–1.9)

## 2023-05-10 LAB — BRAIN NATRIURETIC PEPTIDE: B Natriuretic Peptide: 124.3 pg/mL — ABNORMAL HIGH (ref 0.0–100.0)

## 2023-05-10 MED ORDER — IOHEXOL 350 MG/ML SOLN
75.0000 mL | Freq: Once | INTRAVENOUS | Status: AC | PRN
  Administered 2023-05-10: 36 mL via INTRAVENOUS

## 2023-05-10 MED ORDER — MORPHINE SULFATE (PF) 2 MG/ML IV SOLN
2.0000 mg | INTRAVENOUS | Status: DC | PRN
  Administered 2023-05-10: 2 mg via INTRAVENOUS
  Filled 2023-05-10: qty 1

## 2023-05-10 MED ORDER — DM-GUAIFENESIN ER 30-600 MG PO TB12: 1.0000 | ORAL_TABLET | Freq: Two times a day (BID) | ORAL | Status: DC | PRN

## 2023-05-10 MED ORDER — ALBUTEROL SULFATE (2.5 MG/3ML) 0.083% IN NEBU: 2.5000 mg | INHALATION_SOLUTION | RESPIRATORY_TRACT | Status: DC | PRN

## 2023-05-10 MED ORDER — OXYCODONE HCL 5 MG/5ML PO SOLN
5.0000 mg | Freq: Once | ORAL | Status: AC
Start: 2023-05-10 — End: 2023-05-10
  Administered 2023-05-10: 5 mg via ORAL

## 2023-05-10 MED ORDER — ACETAMINOPHEN 325 MG PO TABS: 650.0000 mg | ORAL_TABLET | Freq: Four times a day (QID) | ORAL | Status: DC | PRN

## 2023-05-10 MED ORDER — OXYCODONE-ACETAMINOPHEN 5-325 MG PO TABS
1.0000 | ORAL_TABLET | ORAL | Status: DC | PRN
  Administered 2023-05-11 (×2): 1 via ORAL
  Filled 2023-05-10 (×2): qty 1

## 2023-05-10 MED ORDER — HEPARIN (PORCINE) 25000 UT/250ML-% IV SOLN
750.0000 [IU]/h | INTRAVENOUS | Status: DC
  Administered 2023-05-10: 850 [IU]/h via INTRAVENOUS
  Filled 2023-05-10: qty 250

## 2023-05-10 MED ORDER — ONDANSETRON HCL 4 MG/2ML IJ SOLN: 4.0000 mg | Freq: Three times a day (TID) | INTRAMUSCULAR | Status: DC | PRN

## 2023-05-10 NOTE — H&P (Signed)
 History and Physical    Denise Watts FMW:[id number] DOB: 29-Dec-1955 DOA: 05/10/2023  Referring MD/NP/PA:   PCP: [name], DO   Patient coming from:  The patient is coming from home.     Chief Complaint: leg pain  HPI: Denise Watts is a 68 y.o. female with medical history significant of multiple cancer including vocal cord squamous cell carcinoma, breast cancer, adrenal carcinoma, non-small cell lung cancer (currently is on chemotherapy for lung cancer), HLD, COPD, CAD, dCHF, depression with anxiety, A-fib and DVT on Eliquis , mycoplasma pneumonia, who presents with leg pain.  Patient was recently hospitalized from 1/4 - 1/6 due to right leg DVT and possible cellulitis. Pt was on Eliquis  and antibiotics (Duricef and doxycycline ). Pt states that she has worsening pain in both legs, which is constant, aching, severe, nonradiating.  Patient has mild chest pain and mild shortness of breath.  She has cough with little mucus production, occasionally mixed with streaks of blood.  Patient has nausea, no vomiting, diarrhea or abdominal pain.  Patient is constipated. No symptoms of UTI.  No fall or head injury.   Data reviewed independently and ED Course: pt was found to have WBC 3.2, GFR> 60, BNP 124, temperature normal, blood pressure 137/85, heart rate 114, RR 18, oxygen  saturation 100% on room air.  Lower extremity venous Dopplers negative for DVT.  Patient is admitted to PCU as inpatient.  Dr. Marea of [facility] is consulted.  CTA: 1. Segmental and subsegmental pulmonary emboli in the right lower lobe pulmonary arteries. Positive for acute PE with CT evidence of right heart strain (RV/LV Ratio = 1.2) consistent with at least submassive (intermediate risk) PE. The presence of right heart strain has been associated with an increased risk of morbidity and mortality. Please refer to the Code PE Focused order set in EPIC. 2. Crescentic filling defect in the lower thoracic descending aorta extending  into the upper abdominal aorta is not definitively seen on prior imaging and may represent a thrombosed dissection. 3. Similar appearance of multiple pulmonary nodules.   Aortic Atherosclerosis (ICD10-I70.0) and Emphysema (ICD10-J43.9).  CXR: There are several bilateral lung nodules, increased in number since the prior study, concerning for worsening metastatic disease. Further evaluation with nonemergent chest CT scan is recommended.   EKG: I have personally reviewed.  Sinus rhythm, QTc 482, RAE, low voltage   Review of Systems:   General: no fevers, chills, no body weight gain, has fatigue HEENT: no blurry vision, hearing changes or sore throat Respiratory: has dyspnea, coughing,  no wheezing CV: has mild chest pain, no palpitations GI: has nausea, no vomiting, abdominal pain, diarrhea, constipation GU: no dysuria, burning on urination, increased urinary frequency, hematuria  Ext: has leg edema and edema Neuro: no unilateral weakness, numbness, or tingling, no vision change or hearing loss Skin: no rash, no skin tear. MSK: No muscle spasm, no deformity, no limitation of range of movement in spin Heme: No easy bruising.  Travel history: No recent long distant travel.   Allergy:  Allergies  Allergen Reactions   Sulfa  Antibiotics Other (See Comments)    Mouth sore and raw    Past Medical History:  Diagnosis Date   Adenomatous colon polyp    Adrenal carcinoma, right (HCC) 12/28/2016   a.) high grade carcinoma with extensive necrosis --> s/p adrenalectomy   Anxiety    Aortic atherosclerosis (HCC)    Arthritis of right knee    Atrial fibrillation and flutter (HCC)    a.)  CHA2DS2-VASc = 3 (age, sex, vascular disease history) as of 02/13/2023; b.) cardiac rate/rhythm maintained intrinsically without pharmacological intervention; no chronic OAC (does take low dose ASA)   B12 deficiency    Breast cancer (HCC) 1997   a.) stage IB (cT1a, cN0, cM0, G3, ER+, PR-, HER2-); s/p  lumpectomy + XRT   COPD (chronic obstructive pulmonary disease) (HCC)    Coronary artery disease    a.) cCTA 12/09/2021: Ca2+ = 135 (80th %'ile for age/sex/race match contol) --> 25-49% pLAD and RCA distributions   Depression    Diastolic dysfunction    a.) TTE 12/11/2020, no RWMAs, norm RVSF, G1DD; b.) TTE 12/14/2021: EF 55-60, no RWMAs, norm RVSF, G1DD; c.) TTE 01/27/2023: EF 50-55%, no RWMAs, G1DD, mild RAE, mild MR, mod TR   Dry cough 03/02/2017   Dyspnea    Full dentures    GERD (gastroesophageal reflux disease)    History of hiatal hernia    HLD (hyperlipidemia)    Invasive carcinoma of breast (HCC) 11/15/2017   a.) path (+) for G3, ER -, PR -, HER2/neu - ; s/p lumpectomy 01/17/2018 + 4 cycles TC systemic chemotherapy + XRT   Long term current use of aspirin     Personal history of chemotherapy    Personal history of radiation therapy    Pneumothorax of left lung after biopsy 01/25/2023   a.) hypoxic in PACU requiring re-intubation --> transferred to ICU --> chest tube placed by PCCM   RLS (restless legs syndrome)    a.) on ropinirole    Skin cancer    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   a.) Stage IB (cT1a, cN0, cM0, G3, ER+, PR-, HER2-)    Past Surgical History:  Procedure Laterality Date   BIOPSY  01/05/2023   Procedure: BIOPSY;  Surgeon: Unk Corinn Skiff, MD;  Location: [facility];  Service: Gastroenterology;;   BREAST BIOPSY Right 1997   positive   BREAST BIOPSY Right 11/15/2017   INVASIVE MAMMARY CARCINOMA triple negative   BREAST BIOPSY Right 07/20/2020   us  bx, Q marker, neg   BREAST LUMPECTOMY Right 1997   2019 also   BREAST LUMPECTOMY WITH SENTINEL LYMPH NODE BIOPSY Right 12/08/2017   Procedure: BREAST LUMPECTOMY WITH SENTINEL LYMPH NODE BX;  Surgeon: Dessa Reyes ORN, MD;  Location: [facility];  Service: General;  Laterality: Right;   BREAST REDUCTION WITH MASTOPEXY Left 06/10/2019   Procedure: Left breast mastopexy reduction for symmetry;   Surgeon: Lowery Estefana RAMAN, DO;  Location: [facility];  Service: Plastics;  Laterality: Left;  total 2.5 hours   CATARACT EXTRACTION W/PHACO Right 02/07/2022   Procedure: CATARACT EXTRACTION PHACO AND INTRAOCULAR LENS PLACEMENT (IOC) RIGHT DIABETIC;  Surgeon: Myrna Adine Anes, MD;  Location: [facility];  Service: Ophthalmology;  Laterality: Right;  3.60 00:34.7   CATARACT EXTRACTION W/PHACO Left 02/28/2022   Procedure: CATARACT EXTRACTION PHACO AND INTRAOCULAR LENS PLACEMENT (IOC) LEFT DIABETIC 4.11 00:26.1;  Surgeon: Myrna Adine Anes, MD;  Location: [facility];  Service: Ophthalmology;  Laterality: Left;   COLONOSCOPY  2015   COLONOSCOPY N/A 01/05/2023   Procedure: COLONOSCOPY;  Surgeon: Unk Corinn Skiff, MD;  Location: [facility];  Service: Gastroenterology;  Laterality: N/A;   COLONOSCOPY WITH PROPOFOL  N/A 10/29/2019   Procedure: COLONOSCOPY WITH PROPOFOL ;  Surgeon: Jinny Carmine, MD;  Location: [facility];  Service: Endoscopy;  Laterality: N/A;   ESOPHAGOGASTRODUODENOSCOPY (EGD) WITH PROPOFOL  N/A 05/24/2019   Procedure: ESOPHAGOGASTRODUODENOSCOPY (EGD) WITH PROPOFOL ;  Surgeon: Jinny Carmine, MD;  Location: [facility];  Service: Endoscopy;  Laterality: N/A;   ESOPHAGOGASTRODUODENOSCOPY (EGD) WITH PROPOFOL  N/A 09/06/2022   Procedure: ESOPHAGOGASTRODUODENOSCOPY (EGD) WITH PROPOFOL ;  Surgeon: Jinny Carmine, MD;  Location: [facility];  Service: Endoscopy;  Laterality: N/A;   ESOPHAGOGASTRODUODENOSCOPY (EGD) WITH PROPOFOL   01/05/2023   Procedure: ESOPHAGOGASTRODUODENOSCOPY (EGD) WITH PROPOFOL ;  Surgeon: Unk Corinn Skiff, MD;  Location: [facility];  Service: Gastroenterology;;   ESOPHAGUS SURGERY     IR IMAGING GUIDED PORT INSERTION  02/07/2023   KNEE SURGERY Right    LIPOSUCTION WITH LIPOFILLING Right 06/10/2019   Procedure: right breast scar and capsule release with fat grafting;  Surgeon: Lowery Estefana RAMAN, DO;  Location: [facility];  Service: Plastics;  Laterality: Right;   POLYPECTOMY  01/05/2023   Procedure: POLYPECTOMY;  Surgeon: Unk Corinn Skiff, MD;  Location: [facility];  Service: Gastroenterology;;   [facility] REMOVAL     PORTACATH PLACEMENT Right 01/17/2018   Procedure: INSERTION PORT-A-CATH- RIGHT;  Surgeon: Dessa Reyes ORN, MD;  Location: [facility];  Service: General;  Laterality: Right;   ROBOTIC ADRENALECTOMY Right 12/28/2016   Procedure: ROBOTIC ADRENALECTOMY;  Surgeon: Penne Knee, MD;  Location: [facility];  Service: Urology;  Laterality: Right;   SKIN CANCER EXCISION     THROAT SURGERY  1998   throat cancer    TUBAL LIGATION     VENTRAL HERNIA REPAIR N/A 03/03/2017   Procedure: HERNIA REPAIR VENTRAL ADULT;  Surgeon: Shelva Dunnings, MD;  Location: [facility];  Service: General;  Laterality: N/A;   VIDEO BRONCHOSCOPY WITH ENDOBRONCHIAL ULTRASOUND N/A 01/25/2023   Procedure: VIDEO BRONCHOSCOPY WITH ENDOBRONCHIAL ULTRASOUND;  Surgeon: Parris Manna, MD;  Location: [facility];  Service: Thoracic;  Laterality: N/A;   VIDEO BRONCHOSCOPY WITH ENDOBRONCHIAL ULTRASOUND N/A 02/17/2023   Procedure: VIDEO BRONCHOSCOPY WITH ENDOBRONCHIAL ULTRASOUND;  Surgeon: Parris Manna, MD;  Location: [facility];  Service: Thoracic;  Laterality: N/A;    Social History:  reports that she quit smoking about 28 years ago. Her smoking use included cigarettes. She started smoking about 58 years ago. She has a 45 pack-year smoking history. She has been exposed to tobacco smoke. She has never used smokeless tobacco. She reports that she does not currently use alcohol after a past usage of about 4.0 standard drinks of alcohol per week. She reports that she does not use drugs.  Family History:  Family History  Problem Relation Age of Onset   Diabetes Mother    Heart attack Mother    Cancer Sister        cancer of eyes, spread to lung, other places. died at 27   Cancer Sister 76       unknown primary, spread  to bone, brain died in 92's   Cancer Paternal Aunt        unk type   Cancer Paternal Grandmother        type unk   Prostate cancer Neg Hx    Kidney cancer Neg Hx    Bladder Cancer Neg Hx    Breast cancer Neg Hx      Prior to Admission medications   Medication Sig Start Date End Date Taking? Authorizing Provider  APIXABAN  (ELIQUIS ) VTE STARTER PACK (10MG  AND 5MG ) Take as directed on package: start with two-5mg  tablets twice daily for 7 days. On day 8, switch to one-5mg  tablet twice daily. 05/04/23   Borders, Fonda SAUNDERS, NP  benzonatate  (TESSALON ) 100 MG capsule Take 2 capsules (200 mg total) by mouth 2 (two) times daily  as needed for cough. 03/08/23   Emilio Kelly DASEN, FNP  cefadroxil  (DURICEF) 500 MG capsule Take 2 capsules (1,000 mg total) by mouth 2 (two) times daily for 6 days. 05/08/23 05/14/23  Wouk, Devaughn Sayres, MD  doxycycline  (VIBRA -TABS) 100 MG tablet Take 1 tablet (100 mg total) by mouth every 12 (twelve) hours for 6 days. 05/08/23 05/14/23  Wouk, Devaughn Sayres, MD  fentaNYL  (DURAGESIC ) 12 MCG/HR Place 1 patch onto the skin every 3 (three) days. 04/28/23   Dasie Tinnie MATSU, NP  fluconazole  (DIFLUCAN ) 10 MG/ML suspension TAKE 10 ML (100 MG TOTAL) BY MOUTH DAILY 05/01/23   Dasie Tinnie MATSU, NP  lidocaine  (LIDODERM ) 5 % Place 1 patch onto the skin daily. Remove & Discard patch within 12 hours or as directed by MD    [provider]  lidocaine -prilocaine  (EMLA ) cream Apply to affected area once 03/09/23   Melanee Annah BROCKS, MD  omeprazole  (KONVOMEP ) 2 mg/mL SUSP oral suspension Take 20 mLs (40 mg total) by mouth daily. 04/12/23   Borders, Fonda SAUNDERS, NP  ondansetron  (ZOFRAN -ODT) 4 MG disintegrating tablet Take 1 tablet (4 mg total) by mouth every 8 (eight) hours as needed for nausea or vomiting. 04/12/23   Borders, Fonda SAUNDERS, NP  oxyCODONE  (ROXICODONE ) 5 MG/5ML solution Take 5 mLs (5 mg total) by mouth every 4 (four) hours as needed for severe pain (pain score 7-10). 05/02/23   Borders, Fonda SAUNDERS, NP   rosuvastatin  (CRESTOR ) 40 MG tablet TAKE 1 TABLET BY MOUTH DAILY 01/31/23   Emilio Kelly DASEN, FNP  sucralfate  (CARAFATE ) 1 GM/10ML suspension Take 10 mLs (1 g total) by mouth 4 (four) times daily -  with meals and at bedtime. 04/12/23   Borders, Fonda SAUNDERS, NP    Physical Exam: Vitals:   05/10/23 1500 05/10/23 1722 05/11/23 0027 05/11/23 0030  BP: 121/68 137/85 (!) 103/53 (!) 103/53  Pulse: (!) 114 (!) 103 (!) 103 (!) 106  Resp:  18 16   Temp:  97.6 F (36.4 C) 98 F (36.7 C)   TempSrc:  Oral Oral   SpO2: 100% 100% 100% 100%  Weight:      Height:       General: Not in acute distress HEENT:       Eyes: PERRL, EOMI, no jaundice       ENT: No discharge from the ears and nose, no pharynx injection, no tonsillar enlargement.        Neck: No JVD, no bruit, no mass felt. Heme: No neck lymph node enlargement. Cardiac: S1/S2, RRR, No murmurs, No gallops or rubs. Respiratory: No rales, wheezing, rhonchi or rubs. GI: Soft, nondistended, nontender, no rebound pain, no organomegaly, BS present. GU: No hematuria Ext: Has asymmetric leg edema, right leg with 2+ edema and left leg with trace edema. Musculoskeletal: No joint deformities, No joint redness or warmth, no limitation of ROM in spin. Skin: No rashes.  Neuro: Alert, oriented X3, cranial nerves II-XII grossly intact, moves all extremities normally Psych: Patient is not psychotic, no suicidal or hemocidal ideation.  Labs on Admission: I have personally reviewed following labs and imaging studies  CBC: Recent Labs  Lab 05/07/23 0118 05/07/23 0540 05/10/23 1228  WBC 2.7* 2.3* 3.2*  NEUTROABS 1.7 1.4* 2.8  HGB 9.9* 9.6* 10.9*  HCT 30.9* 29.1* 32.4*  MCV 101.0* 99.3 98.5  PLT 204 205 268   Basic Metabolic Panel: Recent Labs  Lab 05/07/23 0118 05/07/23 0540 05/10/23 1228  NA 134* 137 135  K 3.2*  3.3* 3.9  CL 97* 100 99  CO2 24 24 23   GLUCOSE 99 89 124*  BUN 9 7* 11  CREATININE 0.82 0.71 0.71  CALCIUM  8.6* 8.4* 9.4    GFR: Estimated Creatinine Clearance: 50.1 mL/min (by C-G formula based on SCr of 0.71 mg/dL). Liver Function Tests: Recent Labs  Lab 05/07/23 0118 05/10/23 1228  AST 22 31  ALT 14 16  ALKPHOS 69 78  BILITOT 0.7 0.7  PROT 5.8* 6.5  ALBUMIN  2.9* 3.3*   No results for input(s): LIPASE, AMYLASE in the last 168 hours. No results for input(s): AMMONIA in the last 168 hours. Coagulation Profile: Recent Labs  Lab 05/10/23 2038  INR 1.9*   Cardiac Enzymes: No results for input(s): CKTOTAL, CKMB, CKMBINDEX, TROPONINI in the last 168 hours. BNP (last 3 results) No results for input(s): PROBNP in the last 8760 hours. HbA1C: No results for input(s): HGBA1C in the last 72 hours. CBG: No results for input(s): GLUCAP in the last 168 hours. Lipid Profile: No results for input(s): CHOL, HDL, LDLCALC, TRIG, CHOLHDL, LDLDIRECT in the last 72 hours. Thyroid  Function Tests: No results for input(s): TSH, T4TOTAL, FREET4, T3FREE, THYROIDAB in the last 72 hours. Anemia Panel: No results for input(s): VITAMINB12, FOLATE, FERRITIN, TIBC, IRON , RETICCTPCT in the last 72 hours. Urine analysis:    Component Value Date/Time   COLORURINE YELLOW (A) 05/10/2023 1629   APPEARANCEUR CLEAR (A) 05/10/2023 1629   APPEARANCEUR Clear 02/27/2023 0959   LABSPEC 1.013 05/10/2023 1629   PHURINE 5.0 05/10/2023 1629   GLUCOSEU 50 (A) 05/10/2023 1629   HGBUR NEGATIVE 05/10/2023 1629   BILIRUBINUR NEGATIVE 05/10/2023 1629   BILIRUBINUR Negative 02/27/2023 0959   KETONESUR 5 (A) 05/10/2023 1629   PROTEINUR NEGATIVE 05/10/2023 1629   UROBILINOGEN 0.2 11/05/2021 1736   NITRITE NEGATIVE 05/10/2023 1629   LEUKOCYTESUR NEGATIVE 05/10/2023 1629   Sepsis Labs: @LABRCNTIP (procalcitonin:4,lacticidven:4) ) Recent Results (from the past 240 hours)  Blood culture (routine x 2)     Status: None (Preliminary result)   Collection Time: 05/07/23  1:18 AM    Specimen: BLOOD LEFT ARM  Result Value Ref Range Status   Specimen Description BLOOD LEFT ARM  Final   Special Requests   Final    BOTTLES DRAWN AEROBIC AND ANAEROBIC Blood Culture results may not be optimal due to an inadequate volume of blood received in culture bottles   Culture   Final    NO GROWTH 3 DAYS Performed at [facility], [address], [place], [place] [address]    Report Status PENDING  Incomplete  Blood culture (routine x 2)     Status: None (Preliminary result)   Collection Time: 05/07/23  1:18 AM   Specimen: BLOOD LEFT ARM  Result Value Ref Range Status   Specimen Description BLOOD LEFT ARM  Final   Special Requests   Final    BOTTLES DRAWN AEROBIC AND ANAEROBIC Blood Culture adequate volume   Culture   Final    NO GROWTH 3 DAYS Performed at [facility], [address] [place], [place] [address]    Report Status PENDING  Incomplete     Radiological Exams on Admission:   Assessment/Plan Principal Problem:   Acute pulmonary embolism (HCC) Active Problems:   Cellulitis of lower extremity   PAF (paroxysmal atrial fibrillation) (HCC)   COPD (chronic obstructive pulmonary disease) (HCC)   Chronic diastolic CHF (congestive heart failure) (HCC)   HLD (hyperlipidemia)   CAD (coronary artery disease)  Non-small cell lung cancer (HCC)   Depression with anxiety   Assessment and Plan:  Acute pulmonary embolism [facility]): CTA showed segmental and subsegmental pulmonary emboli in the right lower lobe pulmonary arteries. Positive for acute PE with CT evidence of right heart strain (RV/LV Ratio = 1.2) consistent with at least submassive (intermediate risk) PE.  Patient is currently hemodynamically stable. Consulted Dr. Marea of [facility]  -admit to PCU as pt -heparin  drip initiated -2D echocardiogram ordered -pain control: When necessary Percocet and morphine , and tylenol  -prn albuterol  nebs and mucinex    Cellulitis of lower extremity: no  fever -Continue home Duricef and doxycycline   History of PAF (paroxysmal atrial fibrillation) (HCC): Heart rate 110-114 -On IV heparin  as above -As needed metoprolol  IV 2.5 mg every 2 hours for heart rate higher than 125  COPD (chronic obstructive pulmonary disease) (HCC) -Bronchodilators and as needed Mucinex   Chronic diastolic CHF (congestive heart failure) (HCC): BNP normal 125, does not seem to have CHF exacerbation.  2D echo on 01/27/2023 showed EF of 50-55% with grade 1 diastolic dysfunction. -Watch volume status closely  HLD (hyperlipidemia) -Crestor   CAD (coronary artery disease) -Crestor   Non-small cell lung cancer (HCC): On chemotherapy, last treatment was about 10 days ago. -Follow-up with oncology  Depression with anxiety -Continue home medications  Possible zone posterior dissection: CTA incidentally showed crescentic filling defect in the lower thoracic descending aorta extending into the upper abdominal aorta is not definitively seen on prior imaging and may represent a thrombosed dissection. -consulted Dr. Marea of [facility]. It is Okay to treat pt with IV heparin  per Dr. Marea.   DVT ppx: on IV Heparin       Code Status: Full code    Family Communication: Yes, patient's daughter has been   at bed side.    Disposition Plan:  Anticipate discharge back to previous environment  Consults called:  Dr. Marea of [facility]  Admission status and Level of care: Progressive:  as inpt        Dispo: The patient is from: Home              Anticipated d/c is to: Home              Anticipated d/c date is: 2 days              Patient currently is not medically stable to d/c.    Severity of Illness:  The appropriate patient status for this patient is INPATIENT. Inpatient status is judged to be reasonable and necessary in order to provide the required intensity of service to ensure the patient's safety. The patient's presenting symptoms, physical exam findings, and initial radiographic  and laboratory data in the context of their chronic comorbidities is felt to place them at high risk for further clinical deterioration. Furthermore, it is not anticipated that the patient will be medically stable for discharge from the hospital within 2 midnights of admission.   * I certify that at the point of admission it is my clinical judgment that the patient will require inpatient hospital care spanning beyond 2 midnights from the point of admission due to high intensity of service, high risk for further deterioration and high frequency of surveillance required.*       Date of Service 05/11/2023    Caleb Exon [facility]   If 7PM-7AM, please contact night-coverage [email or web address] 05/11/2023, 1:17 AM

## 2023-05-10 NOTE — ED Provider Notes (Signed)
 [facility] Provider Note    Event Date/Time   First MD Initiated Contact with Patient 05/10/23 1429     (approximate)   History   Cellulitis   HPI  Denise Watts is a 68 y.o. female with history of lung cancer status post radiation, chemotherapy who comes in with concerns for increasing swelling of her legs.  Patient reports that she has been taking her antibiotics.  She was just discharged 2 days ago for DVT diagnosed on the second.  She reports that she has noticed some increasing swelling in her legs more on the right side now than the left side.  She does have a little bit of redness on the left foot but she states that the redness is actually come down while being on the antibiotics.  She states that she is continuing to have some pain in her legs as well.  She denies any shortness of breath, chest pain or other concerns but she states that she does not typically have this type of swelling.  Physical Exam   Triage Vital Signs: ED Triage Vitals  Encounter Vitals Group     BP 05/10/23 1225 136/84     Systolic BP Percentile --      Diastolic BP Percentile --      Pulse Rate 05/10/23 1225 (!) 112     Resp 05/10/23 1225 18     Temp 05/10/23 1225 98.4 F (36.9 C)     Temp src --      SpO2 05/10/23 1225 97 %     Weight 05/10/23 1226 114 lb 10.2 oz (52 kg)     Height 05/10/23 1226 5' 1 (1.549 m)     Head Circumference --      Peak Flow --      Pain Score 05/10/23 1226 6     Pain Loc --      Pain Education --      Exclude from Growth Chart --     Most recent vital signs: Vitals:   05/10/23 1225  BP: 136/84  Pulse: (!) 112  Resp: 18  Temp: 98.4 F (36.9 C)  SpO2: 97%     General: Awake, no distress.  CV:  Good peripheral perfusion.  Resp:  Normal effort.  Abd:  No distention.  Other:  Patient has good distal pulses bilaterally.  She is got a little bit of faint redness noted on the left foot but does not feel extremely warm.  The rest of  the leg feels normal and touch.  There is a little bit of edema on the right leg but no signs of any cellulitis on this leg.   ED Results / Procedures / Treatments   Labs (all labs ordered are listed, but only abnormal results are displayed) Labs Reviewed  COMPREHENSIVE METABOLIC PANEL - Abnormal; Notable for the following components:      Result Value   Glucose, Bld 124 (*)    Albumin  3.3 (*)    All other components within normal limits  CBC WITH DIFFERENTIAL/PLATELET - Abnormal; Notable for the following components:   WBC 3.2 (*)    RBC 3.29 (*)    Hemoglobin 10.9 (*)    HCT 32.4 (*)    RDW 15.8 (*)    Lymphs Abs 0.2 (*)    All other components within normal limits  LACTIC ACID, PLASMA  URINALYSIS, W/ REFLEX TO CULTURE (INFECTION SUSPECTED)  BRAIN NATRIURETIC PEPTIDE  TROPONIN I (HIGH SENSITIVITY)  EKG  My interpretation of EKG:  Pending   RADIOLOGY Pending     PROCEDURES:  Critical Care performed: No  Procedures   MEDICATIONS ORDERED IN ED: Medications  oxyCODONE  (ROXICODONE ) 5 MG/5ML solution 5 mg (has no administration in time range)     IMPRESSION / MDM / ASSESSMENT AND PLAN / ED COURSE  I reviewed the triage vital signs and the nursing notes.   Patient's presentation is most consistent with acute presentation with potential threat to life or bodily function.   Patient comes in tachycardic with some worsening leg swelling.  On examination she is got some faint redness of the left leg but she reports that this is improving that is more of the pain and the swelling that had her concerned.  She is been compliant with Eliquis .  She denies any shortness of breath to suggest PE.  Given the swelling is now in the other leg we will get repeat ultrasound to evaluate for worsening clot burden or new clot burden in the right leg.  Will get EKG, BNP, troponin to see if this could be related to heart failure, chest x-ray to evaluate for any edema.  We discussed  that the swelling could be multifactorial given she does report that she has been on steroids, she does have low albumin .  At this time I do not feel it represents treatment failure.  Her white count is improving.  She is slightly tachycardic and we will treat her pain to see if this helps improve that.  Patient handed off to oncoming team pending workup and final disposition     FINAL CLINICAL IMPRESSION(S) / ED DIAGNOSES   Final diagnoses:  Leg swelling     Rx / DC Orders   ED Discharge Orders     None        Note:  This document was prepared using Dragon voice recognition software and may include unintentional dictation errors.   Ernest Ronal BRAVO, MD 05/10/23 [id number]

## 2023-05-10 NOTE — ED Triage Notes (Signed)
 Pt to ED for continued cellulitis to left lower leg. Redness and swelling noted to left lower leg. Reports is having swelling to right leg as well, no redness noted. Recently discharged for same. Taking antibiotics.

## 2023-05-10 NOTE — ED Notes (Signed)
 Pharmacy to bring Oxycodone.

## 2023-05-10 NOTE — Progress Notes (Signed)
 PHARMACY - ANTICOAGULATION CONSULT NOTE  Pharmacy Consult for Heparin  Infusion Indication: pulmonary embolus  Allergies  Allergen Reactions   Sulfa  Antibiotics Other (See Comments)    Mouth sore and raw    Patient Measurements: Height: 5' 1 (154.9 cm) Weight: 52 kg (114 lb 10.2 oz) IBW/kg (Calculated) : 47.8 Heparin  Dosing Weight: 52 kg   Vital Signs: Temp: 97.6 F (36.4 C) (01/08 1722) Temp Source: Oral (01/08 1722) BP: 137/85 (01/08 1722) Pulse Rate: 103 (01/08 1722)  Labs: Recent Labs    05/10/23 1228  HGB 10.9*  HCT 32.4*  PLT 268  CREATININE 0.71  TROPONINIHS 5    Estimated Creatinine Clearance: 50.1 mL/min (by C-G formula based on SCr of 0.71 mg/dL).   Medical History: Past Medical History:  Diagnosis Date   Adenomatous colon polyp    Adrenal carcinoma, right (HCC) 12/28/2016   a.) high grade carcinoma with extensive necrosis --> s/p adrenalectomy   Anxiety    Aortic atherosclerosis (HCC)    Arthritis of right knee    Atrial fibrillation and flutter (HCC)    a.) CHA2DS2-VASc = 3 (age, sex, vascular disease history) as of 02/13/2023; b.) cardiac rate/rhythm maintained intrinsically without pharmacological intervention; no chronic OAC (does take low dose ASA)   B12 deficiency    Breast cancer (HCC) 1997   a.) stage IB (cT1a, cN0, cM0, G3, ER+, PR-, HER2-); s/p lumpectomy + XRT   COPD (chronic obstructive pulmonary disease) (HCC)    Coronary artery disease    a.) cCTA 12/09/2021: Ca2+ = 135 (80th %'ile for age/sex/race match contol) --> 25-49% pLAD and RCA distributions   Depression    Diastolic dysfunction    a.) TTE 12/11/2020, no RWMAs, norm RVSF, G1DD; b.) TTE 12/14/2021: EF 55-60, no RWMAs, norm RVSF, G1DD; c.) TTE 01/27/2023: EF 50-55%, no RWMAs, G1DD, mild RAE, mild MR, mod TR   Dry cough 03/02/2017   Dyspnea    Full dentures    GERD (gastroesophageal reflux disease)    History of hiatal hernia    HLD (hyperlipidemia)    Invasive carcinoma of  breast (HCC) 11/15/2017   a.) path (+) for G3, ER -, PR -, HER2/neu - ; s/p lumpectomy 01/17/2018 + 4 cycles TC systemic chemotherapy + XRT   Long term current use of aspirin     Personal history of chemotherapy    Personal history of radiation therapy    Pneumothorax of left lung after biopsy 01/25/2023   a.) hypoxic in PACU requiring re-intubation --> transferred to ICU --> chest tube placed by PCCM   RLS (restless legs syndrome)    a.) on ropinirole    Skin cancer    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 Of note, she was discharged 2 days ago on apixaban  for DVT on 05/04/23. Bilateral LE ultrasound with no evidence of deep venous thrombosis. CT chest with segmental and subsegmental pulmonary emboli in the right lower lobe pulmonary arteries. Pharmacy was consulted to initiate patient on a heparin  infusion.   Will order baseline INR, aPTT, and heparin  level.  No signs/symptoms of bleeding noted. Hgb 10.9. PLT 268.  Goal of Therapy:  Heparin  level 0.3-0.7 units/ml aPTT 66-102 seconds Monitor  platelets by anticoagulation protocol: Yes   Plan:  No bolus given apixaban  use Start heparin  infusion at a rate of 850 units/hr Check aPTT in 6 hours  Monitor CBC and heparin  level daily   Lum VEAR Mania, PharmD Clinical Pharmacist  05/10/2023,7:53 PM

## 2023-05-10 NOTE — ED Provider Notes (Signed)
-----------------------------------------   7:49 PM on 05/10/2023 ----------------------------------------- I spoke to the radiologist regarding the CTA findings.  Patient has what appears to be right sided pulmonary emboli with mild right heart strain.  Also has what appears to be more chronic appearing aortic dissection that has thrombosed.  Given these findings I spoke to Dr. Marea of vascular surgery.  States heparin  is still warranted and we can admit they will consider doing a thrombectomy if needed.  Will discuss with the hospitalist for admission.   Dorothyann Drivers, MD 05/10/23 1950

## 2023-05-10 NOTE — ED Provider Triage Note (Signed)
 Emergency Medicine Provider Triage Evaluation Note  SAKINA BRIONES , a 68 y.o. female  was evaluated in triage.  Pt complains of bilateral lower extremity edema. Left is worse than right and is erythematous. Recently discharged for the same. Reports compliance with antibiotics. No known fever.  Physical Exam  BP 136/84   Pulse (!) 112   Temp 98.4 F (36.9 C)   Resp 18   Ht 5' 1 (1.549 m)   Wt 52 kg   SpO2 97%   BMI 21.66 kg/m  Gen:   Awake, no distress   Resp:  Normal effort  MSK:   Moves extremities without difficulty  Other:  Bilateral lower extremity edema--left worse than right with erythema  Medical Decision Making  Medically screening exam initiated at 12:29 PM.  Appropriate orders placed.  Merlynn LILLETTE Redo was informed that the remainder of the evaluation will be completed by another provider, this initial triage assessment does not replace that evaluation, and the importance of remaining in the ED until their evaluation is complete.  Labs including lactic sent. Does not meet sepsis criteria at this time.   Herlinda Kirk NOVAK, FNP 05/10/23 1231

## 2023-05-11 ENCOUNTER — Inpatient Hospital Stay (HOSPITAL_COMMUNITY)
Admit: 2023-05-11 | Discharge: 2023-05-11 | Disposition: A | Discharge status: Home / Self Care | Payer: Medicare HMO | Attending: Internal Medicine | Admitting: Internal Medicine

## 2023-05-11 ENCOUNTER — Ambulatory Visit: Payer: Medicare HMO

## 2023-05-11 DIAGNOSIS — C3490 Malignant neoplasm of unspecified part of unspecified bronchus or lung: Secondary | ICD-10-CM

## 2023-05-11 DIAGNOSIS — I480 Paroxysmal atrial fibrillation: Secondary | ICD-10-CM

## 2023-05-11 DIAGNOSIS — I2609 Other pulmonary embolism with acute cor pulmonale: Secondary | ICD-10-CM

## 2023-05-11 DIAGNOSIS — D61818 Other pancytopenia: Secondary | ICD-10-CM | POA: Insufficient documentation

## 2023-05-11 DIAGNOSIS — E876 Hypokalemia: Secondary | ICD-10-CM | POA: Insufficient documentation

## 2023-05-11 LAB — BASIC METABOLIC PANEL
Anion gap: 10 (ref 5–15)
BUN: 8 mg/dL (ref 8–23)
CO2: 27 mmol/L (ref 22–32)
Calcium: 8.4 mg/dL — ABNORMAL LOW (ref 8.9–10.3)
Chloride: 102 mmol/L (ref 98–111)
Creatinine, Ser: 0.56 mg/dL (ref 0.44–1.00)
GFR, Estimated: >60 mL/min (ref >60)
Glucose, Bld: 87 mg/dL (ref 70–99)
Potassium: 2.8 mmol/L — ABNORMAL LOW (ref 3.5–5.1)
Sodium: 139 mmol/L (ref 135–145)

## 2023-05-11 LAB — MAGNESIUM: Magnesium: 2.2 mg/dL (ref 1.7–2.4)

## 2023-05-11 LAB — CBC
HCT: 29.1 % — ABNORMAL LOW (ref 36.0–46.0)
Hemoglobin: 9.5 g/dL — ABNORMAL LOW (ref 12.0–15.0)
MCH: 32.5 pg (ref 26.0–34.0)
MCHC: 32.6 g/dL (ref 30.0–36.0)
MCV: 99.7 fL (ref 80.0–100.0)
Platelets: 262 10*3/uL (ref 150–400)
RBC: 2.92 MIL/uL — ABNORMAL LOW (ref 3.87–5.11)
RDW: 15.9 % — ABNORMAL HIGH (ref 11.5–15.5)
WBC: 2.2 10*3/uL — ABNORMAL LOW (ref 4.0–10.5)
nRBC: 0 % (ref 0.0–0.2)

## 2023-05-11 LAB — HEPARIN LEVEL (UNFRACTIONATED): Heparin Unfractionated: >1.1 [IU]/mL — ABNORMAL HIGH (ref 0.30–0.70)

## 2023-05-11 LAB — CBG MONITORING, ED: Glucose-Capillary: 90 mg/dL (ref 70–99)

## 2023-05-11 LAB — APTT
aPTT: 132 s — ABNORMAL HIGH (ref 24–36)
aPTT: 115 s — ABNORMAL HIGH (ref 24–36)

## 2023-05-11 MED ORDER — POTASSIUM CHLORIDE 10 MEQ/100ML IV SOLN
10.0000 meq | INTRAVENOUS | Status: AC
  Administered 2023-05-11 (×2): 10 meq via INTRAVENOUS
  Filled 2023-05-11 (×2): qty 100

## 2023-05-11 MED ORDER — HEPARIN (PORCINE) 25000 UT/250ML-% IV SOLN
550.0000 [IU]/h | INTRAVENOUS | Status: DC
  Administered 2023-05-11: 750 [IU]/h via INTRAVENOUS

## 2023-05-11 MED ORDER — SENNOSIDES-DOCUSATE SODIUM 8.6-50 MG PO TABS
1.0000 | ORAL_TABLET | Freq: Two times a day (BID) | ORAL | Status: DC
  Administered 2023-05-11 – 2023-05-13 (×5): 1 via ORAL
  Filled 2023-05-11 (×5): qty 1

## 2023-05-11 MED ORDER — OXYCODONE HCL 5 MG/5ML PO SOLN
5.0000 mg | Freq: Four times a day (QID) | ORAL | Status: DC | PRN
  Administered 2023-05-12 – 2023-05-13 (×2): 5 mg via ORAL
  Filled 2023-05-11 (×2): qty 5

## 2023-05-11 MED ORDER — ROSUVASTATIN CALCIUM 10 MG PO TABS
40.0000 mg | ORAL_TABLET | Freq: Every day | ORAL | Status: DC
  Administered 2023-05-11 – 2023-05-13 (×3): 40 mg via ORAL
  Filled 2023-05-11: qty 2
  Filled 2023-05-11 (×2): qty 4

## 2023-05-11 MED ORDER — CEFADROXIL 500 MG PO CAPS
1000.0000 mg | ORAL_CAPSULE | Freq: Two times a day (BID) | ORAL | Status: DC
  Administered 2023-05-11 – 2023-05-13 (×4): 1000 mg via ORAL
  Filled 2023-05-11 (×5): qty 2

## 2023-05-11 MED ORDER — SUCRALFATE 1 GM/10ML PO SUSP
1.0000 g | Freq: Three times a day (TID) | ORAL | Status: DC
  Administered 2023-05-11 – 2023-05-13 (×9): 1 g via ORAL
  Filled 2023-05-11 (×11): qty 10

## 2023-05-11 MED ORDER — DOXYCYCLINE HYCLATE 100 MG PO TABS
100.0000 mg | ORAL_TABLET | Freq: Two times a day (BID) | ORAL | Status: DC
Start: 2023-05-11 — End: 2023-05-15
  Administered 2023-05-11 – 2023-05-13 (×5): 100 mg via ORAL
  Filled 2023-05-11 (×5): qty 1

## 2023-05-11 MED ORDER — FLUCONAZOLE 10 MG/ML PO SUSR
100.0000 mg | Freq: Every day | ORAL | Status: DC
  Administered 2023-05-11 – 2023-05-13 (×2): 100 mg via ORAL
  Filled 2023-05-11 (×3): qty 10

## 2023-05-11 MED ORDER — POLYETHYLENE GLYCOL 3350 17 G PO PACK: 17.0000 g | PACK | Freq: Every day | ORAL | Status: DC | PRN

## 2023-05-11 MED ORDER — POTASSIUM CHLORIDE CRYS ER 20 MEQ PO TBCR
40.0000 meq | EXTENDED_RELEASE_TABLET | ORAL | Status: AC
  Administered 2023-05-11 (×2): 40 meq via ORAL
  Filled 2023-05-11 (×2): qty 2

## 2023-05-11 MED ORDER — BENZONATATE 100 MG PO CAPS
100.0000 mg | ORAL_CAPSULE | Freq: Three times a day (TID) | ORAL | Status: DC | PRN
  Administered 2023-05-11 – 2023-05-13 (×4): 100 mg via ORAL
  Filled 2023-05-11 (×4): qty 1

## 2023-05-11 MED ORDER — OMEPRAZOLE 2 MG/ML ORAL SUSPENSION
40.0000 mg | Freq: Every day | ORAL | Status: DC
  Administered 2023-05-11: 40 mg via ORAL
  Filled 2023-05-11 (×4): qty 20

## 2023-05-11 MED ORDER — METOPROLOL TARTRATE 5 MG/5ML IV SOLN
2.5000 mg | INTRAVENOUS | Status: DC | PRN
Start: 2023-05-11 — End: 2023-05-13

## 2023-05-11 NOTE — ED Notes (Signed)
Lab called to request blood draw.

## 2023-05-11 NOTE — Progress Notes (Signed)
*  PRELIMINARY RESULTS* Echocardiogram 2D Echocardiogram has been performed.  Denise Watts 05/11/2023, 2:07 PM

## 2023-05-11 NOTE — Progress Notes (Signed)
 Progress Note   Patient: Denise Watts FMW:[id number] DOB: [birth year] DOA: 05/10/2023     1 DOS: the patient was seen and examined on 05/11/2023   Brief hospital course: Denise Watts is a 68 y.o. female with medical history significant of multiple cancer including vocal cord squamous cell carcinoma, breast cancer, adrenal carcinoma, non-small cell lung cancer (currently is on chemotherapy for lung cancer), HLD, COPD, CAD, dCHF, depression with anxiety, A-fib and DVT on Eliquis , mycoplasma pneumonia, who presents with leg pain.  She also complaining of mild chest pain shortness of breath.  Small amount of hemoptysis.  CT angiogram of chest showed segmental and subsegmental pulmonary emboli in the right lower lobe, with positive right heart strain.  Also showed a lower thoracic descending aorta filling defect, may present thrombosed dissection. Patient was placed on heparin  drip, vascular surgery consult obtained.   Principal Problem:   Acute pulmonary embolism (HCC) Active Problems:   Cellulitis of lower extremity   PAF (paroxysmal atrial fibrillation) (HCC)   COPD (chronic obstructive pulmonary disease) (HCC)   Chronic diastolic CHF (congestive heart failure) (HCC)   HLD (hyperlipidemia)   CAD (coronary artery disease)   Non-small cell lung cancer (HCC)   Depression with anxiety   Hypokalemia   Pancytopenia (HCC)   Assessment and Plan:  Acute pulmonary embolism (HCC): CTA showed segmental and subsegmental pulmonary emboli in the right lower lobe pulmonary arteries. Positive for acute PE with CT evidence of right heart strain (RV/LV Ratio = 1.2) consistent with at least submassive (intermediate risk) PE.  Patient is currently hemodynamically stable.  Consult from vascular surgery is obtained, patient will be continued on heparin  drip.  Patient was just recently started on Eliquis , but due to non-small cell lung cancer, patient will need Lovenox  at the time of discharge.   Cellulitis of lower  extremity: no fever -Continue home Duricef and doxycycline  Are improved.   History of PAF (paroxysmal atrial fibrillation) (HCC): Heart rate 110-114 -On IV heparin  as above Continue as needed metoprolol .   COPD (chronic obstructive pulmonary disease) (HCC) -Bronchodilators and as needed Mucinex    Chronic diastolic CHF (congestive heart failure) (HCC): BNP normal 125, does not seem to have CHF exacerbation.  2D echo on 01/27/2023 showed EF of 50-55% with grade 1 diastolic dysfunction. 3 no volume overload.   HLD (hyperlipidemia) -Crestor    CAD (coronary artery disease) -Crestor    Non-small cell lung cancer (HCC) Pancytopenia secondary to chemotherapy. On chemotherapy, last treatment was about 10 days ago. -Follow-up with oncology   Depression with anxiety -Continue home medications   Possible ascending aorta thrombosed dissection: CTA incidentally showed crescentic filling defect in the lower thoracic descending aorta extending into the upper abdominal aorta is not definitively seen on prior imaging and may represent a thrombosed dissection. Patient will be seen by vascular surgery today.        Subjective:  Patient has a cough, nonproductive.  Some short of breath with duration.  Physical Exam: Vitals:   05/11/23 0030 05/11/23 0500 05/11/23 0700 05/11/23 0845  BP: (!) 103/53 96/62 115/67   Pulse: (!) 106 97 93   Resp:  17 18   Temp:    97.6 F (36.4 C)  TempSrc:    Oral  SpO2: 100% 95% 95%   Weight:      Height:       General exam: Appears calm and comfortable  Respiratory system: Clear to auscultation. Respiratory effort normal. Cardiovascular system: Irregular. No JVD, murmurs, rubs, gallops or  clicks. No pedal edema. Gastrointestinal system: Abdomen is nondistended, soft and nontender. No organomegaly or masses felt. Normal bowel sounds heard. Central nervous system: Alert and oriented. No focal neurological deficits. Extremities: Symmetric 5 x 5  power. Skin: No rashes, lesions or ulcers Psychiatry: Judgement and insight appear normal. Mood & affect appropriate.    Data Reviewed:  CT scan and lab results reviewed.  Family Communication: Daughter updated at bedside.  Disposition: Status is: Inpatient Remains inpatient appropriate because: Hard of disease, IV treatment.     Time spent: 35  minutes  Author: Murvin Mana, MD 05/11/2023 11:10 AM  For on call review [email or web address].

## 2023-05-11 NOTE — Consult Note (Signed)
 Hospital Consult    Reason for Consult:  Pulmonary Embolism  Requesting Physician: Dr Caleb Exon MD MRN #:  [id number]  History of Present Illness: This is a 68 y.o. female who has a  medical history significant of multiple cancer including vocal cord squamous cell carcinoma, breast cancer, adrenal carcinoma, non-small cell lung cancer (currently is on chemotherapy for lung cancer), HLD, COPD, CAD, dCHF, depression with anxiety, A-fib and DVT on Eliquis , mycoplasma pneumonia, who presents with leg pain. Patient was recently hospitalized from 1/4 - 1/6 due to right leg DVT and possible cellulitis. Pt was on Eliquis  and antibiotics (Duricef and doxycycline ). Pt states that she has worsening pain in both legs, which is constant, aching, severe, nonradiating.  Patient has mild chest pain and mild shortness of breath.  She has cough with little mucus production, occasionally mixed with streaks of blood.   On exam today the patient patient is resting comfortably in bed in the emergency room.  Patient does endorse that she is feeling short of breath today.  She endorses that she dropped a canister of ice cream on her leg which caused her to be hospitalized for DVT with cellulitis.  She states that she was started on anticoagulation Eliquis  10 mg twice a day last week while being in the hospital.  She does endorse some mild chest pain which she feels is worse when she has to take a deep breath.  She does endorse cough with some mucus production.  She does endorse that she is currently taking chemotherapy and radiation treatments for her cancer.  No other complaints and vitals are remained stable.  Vascular surgery consulted to evaluate for pulmonary embolism.  Past Medical History:  Diagnosis Date   Adenomatous colon polyp    Adrenal carcinoma, right (HCC) 12/28/2016   a.) high grade carcinoma with extensive necrosis --> s/p adrenalectomy   Anxiety    Aortic atherosclerosis (HCC)    Arthritis of right  knee    Atrial fibrillation and flutter (HCC)    a.) CHA2DS2-VASc = 3 (age, sex, vascular disease history) as of 02/13/2023; b.) cardiac rate/rhythm maintained intrinsically without pharmacological intervention; no chronic OAC (does take low dose ASA)   B12 deficiency    Breast cancer (HCC) 1997   a.) stage IB (cT1a, cN0, cM0, G3, ER+, PR-, HER2-); s/p lumpectomy + XRT   COPD (chronic obstructive pulmonary disease) (HCC)    Coronary artery disease    a.) cCTA 12/09/2021: Ca2+ = 135 (80th %'ile for age/sex/race match contol) --> 25-49% pLAD and RCA distributions   Depression    Diastolic dysfunction    a.) TTE 12/11/2020, no RWMAs, norm RVSF, G1DD; b.) TTE 12/14/2021: EF 55-60, no RWMAs, norm RVSF, G1DD; c.) TTE 01/27/2023: EF 50-55%, no RWMAs, G1DD, mild RAE, mild MR, mod TR   Dry cough 03/02/2017   Dyspnea    Full dentures    GERD (gastroesophageal reflux disease)    History of hiatal hernia    HLD (hyperlipidemia)    Invasive carcinoma of breast (HCC) 11/15/2017   a.) path (+) for G3, ER -, PR -, HER2/neu - ; s/p lumpectomy 01/17/2018 + 4 cycles TC systemic chemotherapy + XRT   Long term current use of aspirin     Personal history of chemotherapy    Personal history of radiation therapy    Pneumothorax of left lung after biopsy 01/25/2023   a.) hypoxic in PACU requiring re-intubation --> transferred to ICU --> chest tube placed by PCCM  RLS (restless legs syndrome)    a.) on ropinirole    Skin cancer    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   a.) Stage IB (cT1a, cN0, cM0, G3, ER+, PR-, HER2-)    Past Surgical History:  Procedure Laterality Date   BIOPSY  01/05/2023   Procedure: BIOPSY;  Surgeon: Unk Corinn Skiff, MD;  Location: [facility];  Service: Gastroenterology;;   BREAST BIOPSY Right 1997   positive   BREAST BIOPSY Right 11/15/2017   INVASIVE MAMMARY CARCINOMA triple negative   BREAST BIOPSY Right 07/20/2020   us  bx, Q marker, neg   BREAST LUMPECTOMY  Right 1997   2019 also   BREAST LUMPECTOMY WITH SENTINEL LYMPH NODE BIOPSY Right 12/08/2017   Procedure: BREAST LUMPECTOMY WITH SENTINEL LYMPH NODE BX;  Surgeon: Dessa Reyes ORN, MD;  Location: [facility];  Service: General;  Laterality: Right;   BREAST REDUCTION WITH MASTOPEXY Left 06/10/2019   Procedure: Left breast mastopexy reduction for symmetry;  Surgeon: Lowery Estefana RAMAN, DO;  Location: [facility];  Service: Plastics;  Laterality: Left;  total 2.5 hours   CATARACT EXTRACTION W/PHACO Right 02/07/2022   Procedure: CATARACT EXTRACTION PHACO AND INTRAOCULAR LENS PLACEMENT (IOC) RIGHT DIABETIC;  Surgeon: Myrna Adine Anes, MD;  Location: [facility];  Service: Ophthalmology;  Laterality: Right;  3.60 00:34.7   CATARACT EXTRACTION W/PHACO Left 02/28/2022   Procedure: CATARACT EXTRACTION PHACO AND INTRAOCULAR LENS PLACEMENT (IOC) LEFT DIABETIC 4.11 00:26.1;  Surgeon: Myrna Adine Anes, MD;  Location: [facility];  Service: Ophthalmology;  Laterality: Left;   COLONOSCOPY  2015   COLONOSCOPY N/A 01/05/2023   Procedure: COLONOSCOPY;  Surgeon: Unk Corinn Skiff, MD;  Location: [facility];  Service: Gastroenterology;  Laterality: N/A;   COLONOSCOPY WITH PROPOFOL  N/A 10/29/2019   Procedure: COLONOSCOPY WITH PROPOFOL ;  Surgeon: Jinny Carmine, MD;  Location: [facility];  Service: Endoscopy;  Laterality: N/A;   ESOPHAGOGASTRODUODENOSCOPY (EGD) WITH PROPOFOL  N/A 05/24/2019   Procedure: ESOPHAGOGASTRODUODENOSCOPY (EGD) WITH PROPOFOL ;  Surgeon: Jinny Carmine, MD;  Location: [facility];  Service: Endoscopy;  Laterality: N/A;   ESOPHAGOGASTRODUODENOSCOPY (EGD) WITH PROPOFOL  N/A 09/06/2022   Procedure: ESOPHAGOGASTRODUODENOSCOPY (EGD) WITH PROPOFOL ;  Surgeon: Jinny Carmine, MD;  Location: [facility];  Service: Endoscopy;  Laterality: N/A;   ESOPHAGOGASTRODUODENOSCOPY (EGD) WITH PROPOFOL   01/05/2023   Procedure: ESOPHAGOGASTRODUODENOSCOPY (EGD) WITH PROPOFOL ;   Surgeon: Unk Corinn Skiff, MD;  Location: [facility];  Service: Gastroenterology;;   ESOPHAGUS SURGERY     IR IMAGING GUIDED PORT INSERTION  02/07/2023   KNEE SURGERY Right    LIPOSUCTION WITH LIPOFILLING Right 06/10/2019   Procedure: right breast scar and capsule release with fat grafting;  Surgeon: Lowery Estefana RAMAN, DO;  Location: [facility];  Service: Plastics;  Laterality: Right;   POLYPECTOMY  01/05/2023   Procedure: POLYPECTOMY;  Surgeon: Unk Corinn Skiff, MD;  Location: [facility];  Service: Gastroenterology;;   [facility] REMOVAL     PORTACATH PLACEMENT Right 01/17/2018   Procedure: INSERTION PORT-A-CATH- RIGHT;  Surgeon: Dessa Reyes ORN, MD;  Location: [facility];  Service: General;  Laterality: Right;   ROBOTIC ADRENALECTOMY Right 12/28/2016   Procedure: ROBOTIC ADRENALECTOMY;  Surgeon: Penne Knee, MD;  Location: [facility];  Service: Urology;  Laterality: Right;   SKIN CANCER EXCISION     THROAT SURGERY  1998   throat cancer    TUBAL LIGATION     VENTRAL HERNIA REPAIR N/A 03/03/2017   Procedure: HERNIA REPAIR VENTRAL ADULT;  Surgeon: Shelva Dunnings, MD;  Location: [facility];  Service: General;  Laterality: N/A;   VIDEO BRONCHOSCOPY WITH ENDOBRONCHIAL ULTRASOUND N/A 01/25/2023   Procedure: VIDEO BRONCHOSCOPY WITH ENDOBRONCHIAL ULTRASOUND;  Surgeon: Parris Manna, MD;  Location: [facility];  Service: Thoracic;  Laterality: N/A;   VIDEO BRONCHOSCOPY WITH ENDOBRONCHIAL ULTRASOUND N/A 02/17/2023   Procedure: VIDEO BRONCHOSCOPY WITH ENDOBRONCHIAL ULTRASOUND;  Surgeon: Parris Manna, MD;  Location: [facility];  Service: Thoracic;  Laterality: N/A;    Allergies  Allergen Reactions   Sulfa  Antibiotics Other (See Comments)    Mouth sore and raw    Prior to Admission medications   Medication Sig Start Date End Date Taking? Authorizing Provider  APIXABAN  (ELIQUIS ) VTE STARTER PACK (10MG  AND 5MG ) Take as directed on package: start with two-5mg  tablets  twice daily for 7 days. On day 8, switch to one-5mg  tablet twice daily. 05/04/23  Yes Borders, Fonda SAUNDERS, NP  benzonatate  (TESSALON ) 100 MG capsule Take 2 capsules (200 mg total) by mouth 2 (two) times daily as needed for cough. 03/08/23  Yes Emilio Kelly DASEN, FNP  cefadroxil  (DURICEF) 500 MG capsule Take 2 capsules (1,000 mg total) by mouth 2 (two) times daily for 6 days. 05/08/23 05/14/23 Yes Wouk, Devaughn Sayres, MD  doxycycline  (VIBRA -TABS) 100 MG tablet Take 1 tablet (100 mg total) by mouth every 12 (twelve) hours for 6 days. 05/08/23 05/14/23 Yes Wouk, Devaughn Sayres, MD  fentaNYL  (DURAGESIC ) 12 MCG/HR Place 1 patch onto the skin every 3 (three) days. 04/28/23  Yes Dasie Tinnie MATSU, NP  fluconazole  (DIFLUCAN ) 10 MG/ML suspension TAKE 10 ML (100 MG TOTAL) BY MOUTH DAILY 05/01/23  Yes Dasie Tinnie MATSU, NP  lidocaine  (LIDODERM ) 5 % Place 1 patch onto the skin daily. Remove & Discard patch within 12 hours or as directed by MD   Yes [provider]  lidocaine -prilocaine  (EMLA ) cream Apply to affected area once 03/09/23  Yes Melanee Annah BROCKS, MD  omeprazole  (KONVOMEP ) 2 mg/mL SUSP oral suspension Take 20 mLs (40 mg total) by mouth daily. 04/12/23  Yes Borders, Fonda SAUNDERS, NP  ondansetron  (ZOFRAN -ODT) 4 MG disintegrating tablet Take 1 tablet (4 mg total) by mouth every 8 (eight) hours as needed for nausea or vomiting. 04/12/23  Yes Borders, Fonda SAUNDERS, NP  oxyCODONE  (ROXICODONE ) 5 MG/5ML solution Take 5 mLs (5 mg total) by mouth every 4 (four) hours as needed for severe pain (pain score 7-10). 05/02/23  Yes Borders, Fonda SAUNDERS, NP  rosuvastatin  (CRESTOR ) 40 MG tablet TAKE 1 TABLET BY MOUTH DAILY 01/31/23  Yes Emilio Kelly T, FNP  sucralfate  (CARAFATE ) 1 GM/10ML suspension Take 10 mLs (1 g total) by mouth 4 (four) times daily -  with meals and at bedtime. 04/12/23  Yes Borders, Fonda SAUNDERS, NP    Social History   Socioeconomic History   Marital status: Married    Spouse name: Johonna, Binette (Spouse) [phone] ([place])    Number of children: 4   Years of education: Not on file   Highest education level: 10th grade  Occupational History   Occupation: retired  Tobacco Use   Smoking status: Former    Current packs/day: 0.00    Average packs/day: 1.5 packs/day for 30.0 years (45.0 ttl pk-yrs)    Types: Cigarettes    Start date: 05/02/1965    Quit date: 05/03/1995    Years since quitting: 28.0    Passive exposure: Past   Smokeless tobacco: Never  Vaping Use   Vaping status: Never Used  Substance and Sexual Activity   Alcohol use: Not  Currently    Alcohol/week: 4.0 standard drinks of alcohol    Types: 4 Shots of liquor per week    Comment: none   Drug use: No   Sexual activity: Not Currently    Birth control/protection: Post-menopausal  Other Topics Concern   Not on file  Social History Narrative   Not on file   Social Drivers of Health   Financial Resource Strain: Low Risk  (12/02/2021)   Overall Financial Resource Strain (CARDIA)    Difficulty of Paying Living Expenses: Not very hard  Food Insecurity: No Food Insecurity (05/09/2023)   Hunger Vital Sign    Worried About Running Out of Food in the Last Year: Never true    Ran Out of Food in the Last Year: Never true  Transportation Needs: No Transportation Needs (05/09/2023)   PRAPARE - Administrator, Civil Service (Medical): No    Lack of Transportation (Non-Medical): No  Physical Activity: Insufficiently Active (12/05/2022)   Exercise Vital Sign    Days of Exercise per Week: 3 days    Minutes of Exercise per Session: 30 min  Stress: Stress Concern Present (12/05/2022)   [employer] of Occupational Health - Occupational Stress Questionnaire    Feeling of Stress : Rather much  Social Connections: Moderately Isolated (12/05/2022)   Social Connection and Isolation Panel [NHANES]    Frequency of Communication with Friends and Family: Three times a week    Frequency of Social Gatherings with Friends and Family: Twice a week    Attends  Religious Services: Never    Database Administrator or Organizations: No    Attends Banker Meetings: Never    Marital Status: Married  Catering Manager Violence: Not At Risk (05/09/2023)   Humiliation, Afraid, Rape, and Kick questionnaire    Fear of Current or Ex-Partner: No    Emotionally Abused: No    Physically Abused: No    Sexually Abused: No     Family History  Problem Relation Age of Onset   Diabetes Mother    Heart attack Mother    Cancer Sister        cancer of eyes, spread to lung, other places. died at 45   Cancer Sister 67       unknown primary, spread to bone, brain died in 7's   Cancer Paternal Aunt        unk type   Cancer Paternal Grandmother        type unk   Prostate cancer Neg Hx    Kidney cancer Neg Hx    Bladder Cancer Neg Hx    Breast cancer Neg Hx     ROS: Otherwise negative unless mentioned in HPI  Physical Examination  Vitals:   05/11/23 0700 05/11/23 0845  BP: 115/67   Pulse: 93   Resp: 18   Temp:  97.6 F (36.4 C)  SpO2: 95%    Body mass index is 21.66 kg/m.  General:  WDWN in NAD Gait: Not observed HENT: WNL, normocephalic Pulmonary: normal non-labored breathing, without Rales, rhonchi,  wheezing Cardiac: regular, tachycardia, without  Murmurs, rubs or gallops; without carotid bruits Abdomen: Positive bowel sounds throughout soft, NT/ND, no masses Skin: without rashes Vascular Exam/Pulses: Palpable pulses throughout.  Bilateral lower extremity is warm to touch.  Right lower extremity with +2 edema.  Prior DVT noted in his leg. Extremities: without ischemic changes, without Gangrene , with cellulitis; without open wounds;  Musculoskeletal: no muscle wasting or atrophy  Neurologic: A&O X 3;  No focal weakness or paresthesias are detected; speech is fluent/normal Psychiatric:  The pt has Normal affect. Lymph:  Unremarkable  CBC    Component Value Date/Time   WBC 2.2 (L) 05/11/2023 0705   RBC 2.92 (L) 05/11/2023  0705   HGB 9.5 (L) 05/11/2023 0705   HGB 10.9 (L) 05/01/2023 0924   HGB 14.0 11/03/2021 1551   HCT 29.1 (L) 05/11/2023 0705   HCT 42.1 11/03/2021 1551   PLT 262 05/11/2023 0705   PLT 163 05/01/2023 0924   PLT 216 11/03/2021 1551   MCV 99.7 05/11/2023 0705   MCV 97 11/03/2021 1551   MCV 100 04/08/2014 0409   MCH 32.5 05/11/2023 0705   MCHC 32.6 05/11/2023 0705   RDW 15.9 (H) 05/11/2023 0705   RDW 12.9 11/03/2021 1551   RDW 12.7 04/08/2014 0409   LYMPHSABS 0.2 (L) 05/10/2023 1228   LYMPHSABS 1.2 11/03/2021 1551   LYMPHSABS 0.8 (L) 04/08/2014 0409   MONOABS 0.2 05/10/2023 1228   MONOABS 0.7 04/08/2014 0409   EOSABS 0.0 05/10/2023 1228   EOSABS 0.2 11/03/2021 1551   EOSABS 0.0 04/08/2014 0409   BASOSABS 0.0 05/10/2023 1228   BASOSABS 0.0 11/03/2021 1551   BASOSABS 0.0 04/08/2014 0409    BMET    Component Value Date/Time   NA 139 05/11/2023 0705   NA 145 (H) 11/03/2021 1551   NA 143 04/08/2014 0409   K 2.8 (L) 05/11/2023 0705   K 3.5 04/08/2014 0409   CL 102 05/11/2023 0705   CL 113 (H) 04/08/2014 0409   CO2 27 05/11/2023 0705   CO2 26 04/08/2014 0409   GLUCOSE 87 05/11/2023 0705   GLUCOSE 84 04/08/2014 0409   BUN 8 05/11/2023 0705   BUN 13 11/03/2021 1551   BUN 6 (L) 04/08/2014 0409   CREATININE 0.56 05/11/2023 0705   CREATININE 0.68 05/01/2023 0924   CREATININE 0.67 04/08/2014 0409   CALCIUM  8.4 (L) 05/11/2023 0705   CALCIUM  7.5 (L) 04/08/2014 0409   GFRNONAA >60 05/11/2023 0705   GFRNONAA >60 05/01/2023 0924   GFRNONAA >60 04/08/2014 0409   GFRNONAA >60 11/26/2012 1019   GFRAA >60 01/20/2020 1314   GFRAA >60 04/08/2014 0409   GFRAA >60 11/26/2012 1019    COAGS: Lab Results  Component Value Date   INR 1.9 (H) 05/10/2023   INR 1.0 01/23/2023   INR 1.0 01/06/2023     Non-Invasive Vascular Imaging:   EXAM:05/10/2023 CT ANGIOGRAPHY CHEST WITH CONTRAST   TECHNIQUE: Multidetector CT imaging of the chest was performed using the standard protocol  during bolus administration of intravenous contrast. Multiplanar CT image reconstructions and MIPs were obtained to evaluate the vascular anatomy.   RADIATION DOSE REDUCTION: This exam was performed according to the departmental dose-optimization program which includes automated exposure control, adjustment of the mA and/or kV according to patient size and/or use of iterative reconstruction technique.   CONTRAST:  36mL OMNIPAQUE  IOHEXOL  350 MG/ML SOLN   COMPARISON:  Same day chest radiograph and CT chest 01/23/2023   FINDINGS: Cardiovascular: Segmental and subsegmental pulmonary emboli in the right lower lobe pulmonary arteries. There is evidence of right heart strain with RV-LV ratio of 1.2 and mild flattening of the interventricular septum. No pericardial effusion.   Crescentic filling defect in the lower thoracic descending aorta extending into the upper abdominal aorta terminating at the level of the celiac axis. This is not definitively seen on prior imaging and may represent a thrombosed dissection. The  aorta in the area of thrombosed dissection measures 2.8 x 2.5 cm and the true lumen measures 2.1 cm. Aortic atherosclerotic calcification.   Mediastinum/Nodes: Trachea and esophagus are unremarkable. No thoracic adenopathy   Lungs/Pleura: Emphysema. Diffuse bronchial wall thickening. No focal consolidation, pleural effusion, or pneumothorax.   Multiple pulmonary nodules are redemonstrated. The largest nodules in the left upper lobe on series 7/image 106 and measures 16 by 10 mm, previously 14 x 13 mm. The largest nodule in the left lower lobe measures 11 x 8 mm, previously 11 x 9 mm. Ground-glass nodules along the right minor fissure measure 10 and 5 mm, respectively   Upper Abdomen: No acute abnormality.   Musculoskeletal: No acute fracture. Remote pathologic right ninth posterior rib fracture.   Review of the MIP images confirms the above findings.    IMPRESSION:05/10/2023 1. Segmental and subsegmental pulmonary emboli in the right lower lobe pulmonary arteries. Positive for acute PE with CT evidence of right heart strain (RV/LV Ratio = 1.2) consistent with at least submassive (intermediate risk) PE. The presence of right heart strain has been associated with an increased risk of morbidity and mortality. Please refer to the Code PE Focused order set in EPIC. 2. Crescentic filling defect in the lower thoracic descending aorta extending into the upper abdominal aorta is not definitively seen on prior imaging and may represent a thrombosed dissection. 3. Similar appearance of multiple pulmonary nodules.   Aortic Atherosclerosis (ICD10-I70.0) and Emphysema (ICD10-J43.9).   Critical Value/emergent results were called by telephone at the time of interpretation on 05/10/2023 at 7:41pm to provider PA Franky Moody, who verbally acknowledged these results.  EXAM:05/10/23 BILATERAL LOWER EXTREMITY VENOUS DOPPLER ULTRASOUND   TECHNIQUE: Gray-scale sonography with graded compression, as well as color Doppler and duplex ultrasound were performed to evaluate the lower extremity deep venous systems from the level of the common femoral vein and including the common femoral, femoral, profunda femoral, popliteal and calf veins including the posterior tibial, peroneal and gastrocnemius veins when visible. The superficial great saphenous vein was also interrogated. Spectral Doppler was utilized to evaluate flow at rest and with distal augmentation maneuvers in the common femoral, femoral and popliteal veins.   COMPARISON:  None Available.   FINDINGS: RIGHT LOWER EXTREMITY   Common Femoral Vein: No evidence of thrombus. Normal compressibility, respiratory phasicity and response to augmentation.   Saphenofemoral Junction: No evidence of thrombus. Normal compressibility and flow on color Doppler imaging.   Profunda Femoral Vein: No  evidence of thrombus. Normal compressibility and flow on color Doppler imaging.   Femoral Vein: No evidence of thrombus. Normal compressibility, respiratory phasicity and response to augmentation.   Popliteal Vein: No evidence of thrombus. Normal compressibility, respiratory phasicity and response to augmentation.   Calf Veins: No evidence of thrombus. Normal compressibility and flow on color Doppler imaging.   Superficial Great Saphenous Vein: No evidence of thrombus. Normal compressibility.   Venous Reflux:  None.   Other Findings:  None.   LEFT LOWER EXTREMITY   Common Femoral Vein: No evidence of thrombus. Normal compressibility, respiratory phasicity and response to augmentation.   Saphenofemoral Junction: No evidence of thrombus. Normal compressibility and flow on color Doppler imaging.   Profunda Femoral Vein: No evidence of thrombus. Normal compressibility and flow on color Doppler imaging.   Femoral Vein: No evidence of thrombus. Normal compressibility, respiratory phasicity and response to augmentation.   Popliteal Vein: No evidence of thrombus. Normal compressibility, respiratory phasicity and response to augmentation.   Calf Veins:  No evidence of thrombus. Normal compressibility and flow on color Doppler imaging.   Superficial Great Saphenous Vein: No evidence of thrombus. Normal compressibility.   Venous Reflux:  None.   Other Findings:  None.   IMPRESSION: No evidence of deep venous thrombosis in either lower extremity.  Statin:  Yes.   Beta Blocker:  No. Aspirin :  No. ACEI:  No. ARB:  No. CCB use:  No Other antiplatelets/anticoagulants:  Yes.   Eliquis  10 mg BID   ASSESSMENT/PLAN: This is a 68 y.o. female who has a history of lung cancer and is currently undertaking chemotherapy with radiation presents to [facility] emergency department for increased swelling of her legs and some shortness of breath with mild chest pain.  Upon workup patient was  found to have bilateral pulmonary embolisms with right heart strain.  Patient currently on Eliquis  10 mg twice daily started last week for DVT.  Vascular surgery plans on taking the patient to the vascular lab tomorrow on 05/12/2023 for a pulmonary thrombectomy.  I had a long detailed discussion with the patient regarding the procedure, benefits, risk, and complications.  Patient verbalized understanding wishes to proceed as soon as possible.  I answered all the patient's questions today.  Patient was made n.p.o. after midnight for procedure tomorrow.   -I discussed the plan in detail with Dr. Selinda Gu MD and he agrees with the plan.   Gwendlyn JONELLE Shank [facility] 05/11/2023 11:32 AM

## 2023-05-11 NOTE — Progress Notes (Signed)
 PHARMACY - ANTICOAGULATION CONSULT NOTE  Pharmacy Consult for Heparin  Infusion Indication: pulmonary embolus  Allergies  Allergen Reactions   Sulfa  Antibiotics Other (See Comments)    Mouth sore and raw   Patient Measurements: Height: 5' 1 (154.9 cm) Weight: 52 kg (114 lb 10.2 oz) IBW/kg (Calculated) : 47.8 Heparin  Dosing Weight: 52 kg   Vital Signs: Temp: 98 F (36.7 C) (01/09 1900) Temp Source: Oral (01/09 1900) BP: 118/82 (01/09 1900) Pulse Rate: 112 (01/09 1900)  Labs: Recent Labs    05/10/23 1228 05/10/23 2038 05/10/23 2038 05/10/23 2252 05/11/23 0705 05/11/23 0915 05/11/23 1903  HGB 10.9*  --   --   --  9.5*  --   --   HCT 32.4*  --   --   --  29.1*  --   --   PLT 268  --   --   --  262  --   --   APTT  --  >200*   < > 50*  --  132* 115*  LABPROT  --  22.3*  --   --   --   --   --   INR  --  1.9*  --   --   --   --   --   HEPARINUNFRC  --  >1.10*  --   --   --  >1.10*  --   CREATININE 0.71  --   --   --  0.56  --   --   TROPONINIHS 5  --   --   --   --   --   --    < > = values in this interval not displayed.    Estimated Creatinine Clearance: 50.1 mL/min (by C-G formula based on SCr of 0.56 mg/dL).  Medical History: Past Medical History:  Diagnosis Date   Adenomatous colon polyp    Adrenal carcinoma, right (HCC) 12/28/2016   a.) high grade carcinoma with extensive necrosis --> s/p adrenalectomy   Anxiety    Aortic atherosclerosis (HCC)    Arthritis of right knee    Atrial fibrillation and flutter (HCC)    a.) CHA2DS2-VASc = 3 (age, sex, vascular disease history) as of 02/13/2023; b.) cardiac rate/rhythm maintained intrinsically without pharmacological intervention; no chronic OAC (does take low dose ASA)   B12 deficiency    Breast cancer (HCC) 1997   a.) stage IB (cT1a, cN0, cM0, G3, ER+, PR-, HER2-); s/p lumpectomy + XRT   COPD (chronic obstructive pulmonary disease) (HCC)    Coronary artery disease    a.) cCTA 12/09/2021: Ca2+ = 135 (80th %'ile  for age/sex/race match contol) --> 25-49% pLAD and RCA distributions   Depression    Diastolic dysfunction    a.) TTE 12/11/2020, no RWMAs, norm RVSF, G1DD; b.) TTE 12/14/2021: EF 55-60, no RWMAs, norm RVSF, G1DD; c.) TTE 01/27/2023: EF 50-55%, no RWMAs, G1DD, mild RAE, mild MR, mod TR   Dry cough 03/02/2017   Dyspnea    Full dentures    GERD (gastroesophageal reflux disease)    History of hiatal hernia    HLD (hyperlipidemia)    Invasive carcinoma of breast (HCC) 11/15/2017   a.) path (+) for G3, ER -, PR -, HER2/neu - ; s/p lumpectomy 01/17/2018 + 4 cycles TC systemic chemotherapy + XRT   Long term current use of aspirin     Personal history of chemotherapy    Personal history of radiation therapy    Pneumothorax of left lung after biopsy 01/25/2023  a.) hypoxic in PACU requiring re-intubation --> transferred to ICU --> chest tube placed by PCCM   RLS (restless legs syndrome)    a.) on ropinirole    Skin cancer    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 Of note, she was discharged 2 days ago on apixaban  for DVT on 05/04/23. Bilateral LE ultrasound with no evidence of deep venous thrombosis. CT chest with segmental and subsegmental pulmonary emboli in the right lower lobe pulmonary arteries. Pharmacy was consulted to initiate patient on a heparin  infusion.   Baseline labs Hgb 10.9. PLT 268.  Goal of Therapy:  Heparin  level 0.3-0.7 units/ml aPTT 66-102 seconds Monitor platelets by anticoagulation protocol: Yes  Heparin /aPTT levels Date/Time HL/aPTT Clinical Assessment  1/9 @ 0915 HL > 1.10/aPTT = 132 SUPRAtherapeutic  1/9 @ 1903 aPTT = 115  SUPRAtherapeutic                Plan:  aPTT  supratherapeutic  Decrease rate of infusion to 650 units/hr  Check next aPTT 6 hours after rate change  Monitor aPTT levels until correlation with HL level  Monitor CBC and heparin  level daily   Ajanay Farve, PharmD Pharmacy Resident  05/11/2023 7:49 PM

## 2023-05-11 NOTE — H&P (View-Only) (Signed)
 Hospital Consult    Reason for Consult:  Pulmonary Embolism  Requesting Physician: Dr Caleb Exon MD MRN #:  [id number]  History of Present Illness: This is a 68 y.o. female who has a  medical history significant of multiple cancer including vocal cord squamous cell carcinoma, breast cancer, adrenal carcinoma, non-small cell lung cancer (currently is on chemotherapy for lung cancer), HLD, COPD, CAD, dCHF, depression with anxiety, A-fib and DVT on Eliquis , mycoplasma pneumonia, who presents with leg pain. Patient was recently hospitalized from 1/4 - 1/6 due to right leg DVT and possible cellulitis. Pt was on Eliquis  and antibiotics (Duricef and doxycycline ). Pt states that she has worsening pain in both legs, which is constant, aching, severe, nonradiating.  Patient has mild chest pain and mild shortness of breath.  She has cough with little mucus production, occasionally mixed with streaks of blood.   On exam today the patient patient is resting comfortably in bed in the emergency room.  Patient does endorse that she is feeling short of breath today.  She endorses that she dropped a canister of ice cream on her leg which caused her to be hospitalized for DVT with cellulitis.  She states that she was started on anticoagulation Eliquis  10 mg twice a day last week while being in the hospital.  She does endorse some mild chest pain which she feels is worse when she has to take a deep breath.  She does endorse cough with some mucus production.  She does endorse that she is currently taking chemotherapy and radiation treatments for her cancer.  No other complaints and vitals are remained stable.  Vascular surgery consulted to evaluate for pulmonary embolism.  Past Medical History:  Diagnosis Date   Adenomatous colon polyp    Adrenal carcinoma, right (HCC) 12/28/2016   a.) high grade carcinoma with extensive necrosis --> s/p adrenalectomy   Anxiety    Aortic atherosclerosis (HCC)    Arthritis of right  knee    Atrial fibrillation and flutter (HCC)    a.) CHA2DS2-VASc = 3 (age, sex, vascular disease history) as of 02/13/2023; b.) cardiac rate/rhythm maintained intrinsically without pharmacological intervention; no chronic OAC (does take low dose ASA)   B12 deficiency    Breast cancer (HCC) 1997   a.) stage IB (cT1a, cN0, cM0, G3, ER+, PR-, HER2-); s/p lumpectomy + XRT   COPD (chronic obstructive pulmonary disease) (HCC)    Coronary artery disease    a.) cCTA 12/09/2021: Ca2+ = 135 (80th %'ile for age/sex/race match contol) --> 25-49% pLAD and RCA distributions   Depression    Diastolic dysfunction    a.) TTE 12/11/2020, no RWMAs, norm RVSF, G1DD; b.) TTE 12/14/2021: EF 55-60, no RWMAs, norm RVSF, G1DD; c.) TTE 01/27/2023: EF 50-55%, no RWMAs, G1DD, mild RAE, mild MR, mod TR   Dry cough 03/02/2017   Dyspnea    Full dentures    GERD (gastroesophageal reflux disease)    History of hiatal hernia    HLD (hyperlipidemia)    Invasive carcinoma of breast (HCC) 11/15/2017   a.) path (+) for G3, ER -, PR -, HER2/neu - ; s/p lumpectomy 01/17/2018 + 4 cycles TC systemic chemotherapy + XRT   Long term current use of aspirin     Personal history of chemotherapy    Personal history of radiation therapy    Pneumothorax of left lung after biopsy 01/25/2023   a.) hypoxic in PACU requiring re-intubation --> transferred to ICU --> chest tube placed by PCCM  RLS (restless legs syndrome)    a.) on ropinirole    Skin cancer    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   a.) Stage IB (cT1a, cN0, cM0, G3, ER+, PR-, HER2-)    Past Surgical History:  Procedure Laterality Date   BIOPSY  01/05/2023   Procedure: BIOPSY;  Surgeon: Unk Corinn Skiff, MD;  Location: [facility];  Service: Gastroenterology;;   BREAST BIOPSY Right 1997   positive   BREAST BIOPSY Right 11/15/2017   INVASIVE MAMMARY CARCINOMA triple negative   BREAST BIOPSY Right 07/20/2020   us  bx, Q marker, neg   BREAST LUMPECTOMY  Right 1997   2019 also   BREAST LUMPECTOMY WITH SENTINEL LYMPH NODE BIOPSY Right 12/08/2017   Procedure: BREAST LUMPECTOMY WITH SENTINEL LYMPH NODE BX;  Surgeon: Dessa Reyes ORN, MD;  Location: [facility];  Service: General;  Laterality: Right;   BREAST REDUCTION WITH MASTOPEXY Left 06/10/2019   Procedure: Left breast mastopexy reduction for symmetry;  Surgeon: Lowery Estefana RAMAN, DO;  Location: [facility];  Service: Plastics;  Laterality: Left;  total 2.5 hours   CATARACT EXTRACTION W/PHACO Right 02/07/2022   Procedure: CATARACT EXTRACTION PHACO AND INTRAOCULAR LENS PLACEMENT (IOC) RIGHT DIABETIC;  Surgeon: Myrna Adine Anes, MD;  Location: [facility];  Service: Ophthalmology;  Laterality: Right;  3.60 00:34.7   CATARACT EXTRACTION W/PHACO Left 02/28/2022   Procedure: CATARACT EXTRACTION PHACO AND INTRAOCULAR LENS PLACEMENT (IOC) LEFT DIABETIC 4.11 00:26.1;  Surgeon: Myrna Adine Anes, MD;  Location: [facility];  Service: Ophthalmology;  Laterality: Left;   COLONOSCOPY  2015   COLONOSCOPY N/A 01/05/2023   Procedure: COLONOSCOPY;  Surgeon: Unk Corinn Skiff, MD;  Location: [facility];  Service: Gastroenterology;  Laterality: N/A;   COLONOSCOPY WITH PROPOFOL  N/A 10/29/2019   Procedure: COLONOSCOPY WITH PROPOFOL ;  Surgeon: Jinny Carmine, MD;  Location: [facility];  Service: Endoscopy;  Laterality: N/A;   ESOPHAGOGASTRODUODENOSCOPY (EGD) WITH PROPOFOL  N/A 05/24/2019   Procedure: ESOPHAGOGASTRODUODENOSCOPY (EGD) WITH PROPOFOL ;  Surgeon: Jinny Carmine, MD;  Location: [facility];  Service: Endoscopy;  Laterality: N/A;   ESOPHAGOGASTRODUODENOSCOPY (EGD) WITH PROPOFOL  N/A 09/06/2022   Procedure: ESOPHAGOGASTRODUODENOSCOPY (EGD) WITH PROPOFOL ;  Surgeon: Jinny Carmine, MD;  Location: [facility];  Service: Endoscopy;  Laterality: N/A;   ESOPHAGOGASTRODUODENOSCOPY (EGD) WITH PROPOFOL   01/05/2023   Procedure: ESOPHAGOGASTRODUODENOSCOPY (EGD) WITH PROPOFOL ;   Surgeon: Unk Corinn Skiff, MD;  Location: [facility];  Service: Gastroenterology;;   ESOPHAGUS SURGERY     IR IMAGING GUIDED PORT INSERTION  02/07/2023   KNEE SURGERY Right    LIPOSUCTION WITH LIPOFILLING Right 06/10/2019   Procedure: right breast scar and capsule release with fat grafting;  Surgeon: Lowery Estefana RAMAN, DO;  Location: [facility];  Service: Plastics;  Laterality: Right;   POLYPECTOMY  01/05/2023   Procedure: POLYPECTOMY;  Surgeon: Unk Corinn Skiff, MD;  Location: [facility];  Service: Gastroenterology;;   [facility] REMOVAL     PORTACATH PLACEMENT Right 01/17/2018   Procedure: INSERTION PORT-A-CATH- RIGHT;  Surgeon: Dessa Reyes ORN, MD;  Location: [facility];  Service: General;  Laterality: Right;   ROBOTIC ADRENALECTOMY Right 12/28/2016   Procedure: ROBOTIC ADRENALECTOMY;  Surgeon: Penne Knee, MD;  Location: [facility];  Service: Urology;  Laterality: Right;   SKIN CANCER EXCISION     THROAT SURGERY  1998   throat cancer    TUBAL LIGATION     VENTRAL HERNIA REPAIR N/A 03/03/2017   Procedure: HERNIA REPAIR VENTRAL ADULT;  Surgeon: Shelva Dunnings, MD;  Location: [facility];  Service: General;  Laterality: N/A;   VIDEO BRONCHOSCOPY WITH ENDOBRONCHIAL ULTRASOUND N/A 01/25/2023   Procedure: VIDEO BRONCHOSCOPY WITH ENDOBRONCHIAL ULTRASOUND;  Surgeon: Parris Manna, MD;  Location: [facility];  Service: Thoracic;  Laterality: N/A;   VIDEO BRONCHOSCOPY WITH ENDOBRONCHIAL ULTRASOUND N/A 02/17/2023   Procedure: VIDEO BRONCHOSCOPY WITH ENDOBRONCHIAL ULTRASOUND;  Surgeon: Parris Manna, MD;  Location: [facility];  Service: Thoracic;  Laterality: N/A;    Allergies  Allergen Reactions   Sulfa  Antibiotics Other (See Comments)    Mouth sore and raw    Prior to Admission medications   Medication Sig Start Date End Date Taking? Authorizing Provider  APIXABAN  (ELIQUIS ) VTE STARTER PACK (10MG  AND 5MG ) Take as directed on package: start with two-5mg  tablets  twice daily for 7 days. On day 8, switch to one-5mg  tablet twice daily. 05/04/23  Yes Borders, Fonda SAUNDERS, NP  benzonatate  (TESSALON ) 100 MG capsule Take 2 capsules (200 mg total) by mouth 2 (two) times daily as needed for cough. 03/08/23  Yes Emilio Kelly DASEN, FNP  cefadroxil  (DURICEF) 500 MG capsule Take 2 capsules (1,000 mg total) by mouth 2 (two) times daily for 6 days. 05/08/23 05/14/23 Yes Wouk, Devaughn Sayres, MD  doxycycline  (VIBRA -TABS) 100 MG tablet Take 1 tablet (100 mg total) by mouth every 12 (twelve) hours for 6 days. 05/08/23 05/14/23 Yes Wouk, Devaughn Sayres, MD  fentaNYL  (DURAGESIC ) 12 MCG/HR Place 1 patch onto the skin every 3 (three) days. 04/28/23  Yes Dasie Tinnie MATSU, NP  fluconazole  (DIFLUCAN ) 10 MG/ML suspension TAKE 10 ML (100 MG TOTAL) BY MOUTH DAILY 05/01/23  Yes Dasie Tinnie MATSU, NP  lidocaine  (LIDODERM ) 5 % Place 1 patch onto the skin daily. Remove & Discard patch within 12 hours or as directed by MD   Yes [provider]  lidocaine -prilocaine  (EMLA ) cream Apply to affected area once 03/09/23  Yes Melanee Annah BROCKS, MD  omeprazole  (KONVOMEP ) 2 mg/mL SUSP oral suspension Take 20 mLs (40 mg total) by mouth daily. 04/12/23  Yes Borders, Fonda SAUNDERS, NP  ondansetron  (ZOFRAN -ODT) 4 MG disintegrating tablet Take 1 tablet (4 mg total) by mouth every 8 (eight) hours as needed for nausea or vomiting. 04/12/23  Yes Borders, Fonda SAUNDERS, NP  oxyCODONE  (ROXICODONE ) 5 MG/5ML solution Take 5 mLs (5 mg total) by mouth every 4 (four) hours as needed for severe pain (pain score 7-10). 05/02/23  Yes Borders, Fonda SAUNDERS, NP  rosuvastatin  (CRESTOR ) 40 MG tablet TAKE 1 TABLET BY MOUTH DAILY 01/31/23  Yes Emilio Kelly T, FNP  sucralfate  (CARAFATE ) 1 GM/10ML suspension Take 10 mLs (1 g total) by mouth 4 (four) times daily -  with meals and at bedtime. 04/12/23  Yes Borders, Fonda SAUNDERS, NP    Social History   Socioeconomic History   Marital status: Married    Spouse name: Johonna, Binette (Spouse) [phone] ([place])    Number of children: 4   Years of education: Not on file   Highest education level: 10th grade  Occupational History   Occupation: retired  Tobacco Use   Smoking status: Former    Current packs/day: 0.00    Average packs/day: 1.5 packs/day for 30.0 years (45.0 ttl pk-yrs)    Types: Cigarettes    Start date: 05/02/1965    Quit date: 05/03/1995    Years since quitting: 28.0    Passive exposure: Past   Smokeless tobacco: Never  Vaping Use   Vaping status: Never Used  Substance and Sexual Activity   Alcohol use: Not  Currently    Alcohol/week: 4.0 standard drinks of alcohol    Types: 4 Shots of liquor per week    Comment: none   Drug use: No   Sexual activity: Not Currently    Birth control/protection: Post-menopausal  Other Topics Concern   Not on file  Social History Narrative   Not on file   Social Drivers of Health   Financial Resource Strain: Low Risk  (12/02/2021)   Overall Financial Resource Strain (CARDIA)    Difficulty of Paying Living Expenses: Not very hard  Food Insecurity: No Food Insecurity (05/09/2023)   Hunger Vital Sign    Worried About Running Out of Food in the Last Year: Never true    Ran Out of Food in the Last Year: Never true  Transportation Needs: No Transportation Needs (05/09/2023)   PRAPARE - Administrator, Civil Service (Medical): No    Lack of Transportation (Non-Medical): No  Physical Activity: Insufficiently Active (12/05/2022)   Exercise Vital Sign    Days of Exercise per Week: 3 days    Minutes of Exercise per Session: 30 min  Stress: Stress Concern Present (12/05/2022)   [employer] of Occupational Health - Occupational Stress Questionnaire    Feeling of Stress : Rather much  Social Connections: Moderately Isolated (12/05/2022)   Social Connection and Isolation Panel [NHANES]    Frequency of Communication with Friends and Family: Three times a week    Frequency of Social Gatherings with Friends and Family: Twice a week    Attends  Religious Services: Never    Database Administrator or Organizations: No    Attends Banker Meetings: Never    Marital Status: Married  Catering Manager Violence: Not At Risk (05/09/2023)   Humiliation, Afraid, Rape, and Kick questionnaire    Fear of Current or Ex-Partner: No    Emotionally Abused: No    Physically Abused: No    Sexually Abused: No     Family History  Problem Relation Age of Onset   Diabetes Mother    Heart attack Mother    Cancer Sister        cancer of eyes, spread to lung, other places. died at 45   Cancer Sister 67       unknown primary, spread to bone, brain died in 7's   Cancer Paternal Aunt        unk type   Cancer Paternal Grandmother        type unk   Prostate cancer Neg Hx    Kidney cancer Neg Hx    Bladder Cancer Neg Hx    Breast cancer Neg Hx     ROS: Otherwise negative unless mentioned in HPI  Physical Examination  Vitals:   05/11/23 0700 05/11/23 0845  BP: 115/67   Pulse: 93   Resp: 18   Temp:  97.6 F (36.4 C)  SpO2: 95%    Body mass index is 21.66 kg/m.  General:  WDWN in NAD Gait: Not observed HENT: WNL, normocephalic Pulmonary: normal non-labored breathing, without Rales, rhonchi,  wheezing Cardiac: regular, tachycardia, without  Murmurs, rubs or gallops; without carotid bruits Abdomen: Positive bowel sounds throughout soft, NT/ND, no masses Skin: without rashes Vascular Exam/Pulses: Palpable pulses throughout.  Bilateral lower extremity is warm to touch.  Right lower extremity with +2 edema.  Prior DVT noted in his leg. Extremities: without ischemic changes, without Gangrene , with cellulitis; without open wounds;  Musculoskeletal: no muscle wasting or atrophy  Neurologic: A&O X 3;  No focal weakness or paresthesias are detected; speech is fluent/normal Psychiatric:  The pt has Normal affect. Lymph:  Unremarkable  CBC    Component Value Date/Time   WBC 2.2 (L) 05/11/2023 0705   RBC 2.92 (L) 05/11/2023  0705   HGB 9.5 (L) 05/11/2023 0705   HGB 10.9 (L) 05/01/2023 0924   HGB 14.0 11/03/2021 1551   HCT 29.1 (L) 05/11/2023 0705   HCT 42.1 11/03/2021 1551   PLT 262 05/11/2023 0705   PLT 163 05/01/2023 0924   PLT 216 11/03/2021 1551   MCV 99.7 05/11/2023 0705   MCV 97 11/03/2021 1551   MCV 100 04/08/2014 0409   MCH 32.5 05/11/2023 0705   MCHC 32.6 05/11/2023 0705   RDW 15.9 (H) 05/11/2023 0705   RDW 12.9 11/03/2021 1551   RDW 12.7 04/08/2014 0409   LYMPHSABS 0.2 (L) 05/10/2023 1228   LYMPHSABS 1.2 11/03/2021 1551   LYMPHSABS 0.8 (L) 04/08/2014 0409   MONOABS 0.2 05/10/2023 1228   MONOABS 0.7 04/08/2014 0409   EOSABS 0.0 05/10/2023 1228   EOSABS 0.2 11/03/2021 1551   EOSABS 0.0 04/08/2014 0409   BASOSABS 0.0 05/10/2023 1228   BASOSABS 0.0 11/03/2021 1551   BASOSABS 0.0 04/08/2014 0409    BMET    Component Value Date/Time   NA 139 05/11/2023 0705   NA 145 (H) 11/03/2021 1551   NA 143 04/08/2014 0409   K 2.8 (L) 05/11/2023 0705   K 3.5 04/08/2014 0409   CL 102 05/11/2023 0705   CL 113 (H) 04/08/2014 0409   CO2 27 05/11/2023 0705   CO2 26 04/08/2014 0409   GLUCOSE 87 05/11/2023 0705   GLUCOSE 84 04/08/2014 0409   BUN 8 05/11/2023 0705   BUN 13 11/03/2021 1551   BUN 6 (L) 04/08/2014 0409   CREATININE 0.56 05/11/2023 0705   CREATININE 0.68 05/01/2023 0924   CREATININE 0.67 04/08/2014 0409   CALCIUM  8.4 (L) 05/11/2023 0705   CALCIUM  7.5 (L) 04/08/2014 0409   GFRNONAA >60 05/11/2023 0705   GFRNONAA >60 05/01/2023 0924   GFRNONAA >60 04/08/2014 0409   GFRNONAA >60 11/26/2012 1019   GFRAA >60 01/20/2020 1314   GFRAA >60 04/08/2014 0409   GFRAA >60 11/26/2012 1019    COAGS: Lab Results  Component Value Date   INR 1.9 (H) 05/10/2023   INR 1.0 01/23/2023   INR 1.0 01/06/2023     Non-Invasive Vascular Imaging:   EXAM:05/10/2023 CT ANGIOGRAPHY CHEST WITH CONTRAST   TECHNIQUE: Multidetector CT imaging of the chest was performed using the standard protocol  during bolus administration of intravenous contrast. Multiplanar CT image reconstructions and MIPs were obtained to evaluate the vascular anatomy.   RADIATION DOSE REDUCTION: This exam was performed according to the departmental dose-optimization program which includes automated exposure control, adjustment of the mA and/or kV according to patient size and/or use of iterative reconstruction technique.   CONTRAST:  36mL OMNIPAQUE  IOHEXOL  350 MG/ML SOLN   COMPARISON:  Same day chest radiograph and CT chest 01/23/2023   FINDINGS: Cardiovascular: Segmental and subsegmental pulmonary emboli in the right lower lobe pulmonary arteries. There is evidence of right heart strain with RV-LV ratio of 1.2 and mild flattening of the interventricular septum. No pericardial effusion.   Crescentic filling defect in the lower thoracic descending aorta extending into the upper abdominal aorta terminating at the level of the celiac axis. This is not definitively seen on prior imaging and may represent a thrombosed dissection. The  aorta in the area of thrombosed dissection measures 2.8 x 2.5 cm and the true lumen measures 2.1 cm. Aortic atherosclerotic calcification.   Mediastinum/Nodes: Trachea and esophagus are unremarkable. No thoracic adenopathy   Lungs/Pleura: Emphysema. Diffuse bronchial wall thickening. No focal consolidation, pleural effusion, or pneumothorax.   Multiple pulmonary nodules are redemonstrated. The largest nodules in the left upper lobe on series 7/image 106 and measures 16 by 10 mm, previously 14 x 13 mm. The largest nodule in the left lower lobe measures 11 x 8 mm, previously 11 x 9 mm. Ground-glass nodules along the right minor fissure measure 10 and 5 mm, respectively   Upper Abdomen: No acute abnormality.   Musculoskeletal: No acute fracture. Remote pathologic right ninth posterior rib fracture.   Review of the MIP images confirms the above findings.    IMPRESSION:05/10/2023 1. Segmental and subsegmental pulmonary emboli in the right lower lobe pulmonary arteries. Positive for acute PE with CT evidence of right heart strain (RV/LV Ratio = 1.2) consistent with at least submassive (intermediate risk) PE. The presence of right heart strain has been associated with an increased risk of morbidity and mortality. Please refer to the Code PE Focused order set in EPIC. 2. Crescentic filling defect in the lower thoracic descending aorta extending into the upper abdominal aorta is not definitively seen on prior imaging and may represent a thrombosed dissection. 3. Similar appearance of multiple pulmonary nodules.   Aortic Atherosclerosis (ICD10-I70.0) and Emphysema (ICD10-J43.9).   Critical Value/emergent results were called by telephone at the time of interpretation on 05/10/2023 at 7:41pm to provider PA Franky Moody, who verbally acknowledged these results.  EXAM:05/10/23 BILATERAL LOWER EXTREMITY VENOUS DOPPLER ULTRASOUND   TECHNIQUE: Gray-scale sonography with graded compression, as well as color Doppler and duplex ultrasound were performed to evaluate the lower extremity deep venous systems from the level of the common femoral vein and including the common femoral, femoral, profunda femoral, popliteal and calf veins including the posterior tibial, peroneal and gastrocnemius veins when visible. The superficial great saphenous vein was also interrogated. Spectral Doppler was utilized to evaluate flow at rest and with distal augmentation maneuvers in the common femoral, femoral and popliteal veins.   COMPARISON:  None Available.   FINDINGS: RIGHT LOWER EXTREMITY   Common Femoral Vein: No evidence of thrombus. Normal compressibility, respiratory phasicity and response to augmentation.   Saphenofemoral Junction: No evidence of thrombus. Normal compressibility and flow on color Doppler imaging.   Profunda Femoral Vein: No  evidence of thrombus. Normal compressibility and flow on color Doppler imaging.   Femoral Vein: No evidence of thrombus. Normal compressibility, respiratory phasicity and response to augmentation.   Popliteal Vein: No evidence of thrombus. Normal compressibility, respiratory phasicity and response to augmentation.   Calf Veins: No evidence of thrombus. Normal compressibility and flow on color Doppler imaging.   Superficial Great Saphenous Vein: No evidence of thrombus. Normal compressibility.   Venous Reflux:  None.   Other Findings:  None.   LEFT LOWER EXTREMITY   Common Femoral Vein: No evidence of thrombus. Normal compressibility, respiratory phasicity and response to augmentation.   Saphenofemoral Junction: No evidence of thrombus. Normal compressibility and flow on color Doppler imaging.   Profunda Femoral Vein: No evidence of thrombus. Normal compressibility and flow on color Doppler imaging.   Femoral Vein: No evidence of thrombus. Normal compressibility, respiratory phasicity and response to augmentation.   Popliteal Vein: No evidence of thrombus. Normal compressibility, respiratory phasicity and response to augmentation.   Calf Veins:  No evidence of thrombus. Normal compressibility and flow on color Doppler imaging.   Superficial Great Saphenous Vein: No evidence of thrombus. Normal compressibility.   Venous Reflux:  None.   Other Findings:  None.   IMPRESSION: No evidence of deep venous thrombosis in either lower extremity.  Statin:  Yes.   Beta Blocker:  No. Aspirin :  No. ACEI:  No. ARB:  No. CCB use:  No Other antiplatelets/anticoagulants:  Yes.   Eliquis  10 mg BID   ASSESSMENT/PLAN: This is a 68 y.o. female who has a history of lung cancer and is currently undertaking chemotherapy with radiation presents to [facility] emergency department for increased swelling of her legs and some shortness of breath with mild chest pain.  Upon workup patient was  found to have bilateral pulmonary embolisms with right heart strain.  Patient currently on Eliquis  10 mg twice daily started last week for DVT.  Vascular surgery plans on taking the patient to the vascular lab tomorrow on 05/12/2023 for a pulmonary thrombectomy.  I had a long detailed discussion with the patient regarding the procedure, benefits, risk, and complications.  Patient verbalized understanding wishes to proceed as soon as possible.  I answered all the patient's questions today.  Patient was made n.p.o. after midnight for procedure tomorrow.   -I discussed the plan in detail with Dr. Selinda Gu MD and he agrees with the plan.   Gwendlyn JONELLE Shank [facility] 05/11/2023 11:32 AM

## 2023-05-11 NOTE — Progress Notes (Signed)
 PHARMACY - ANTICOAGULATION CONSULT NOTE  Pharmacy Consult for Heparin  Infusion Indication: pulmonary embolus  Allergies  Allergen Reactions   Sulfa  Antibiotics Other (See Comments)    Mouth sore and raw   Patient Measurements: Height: 5' 1 (154.9 cm) Weight: 52 kg (114 lb 10.2 oz) IBW/kg (Calculated) : 47.8 Heparin  Dosing Weight: 52 kg   Vital Signs: Temp: 97.6 F (36.4 C) (01/09 0845) Temp Source: Oral (01/09 0845) BP: 115/67 (01/09 0700) Pulse Rate: 93 (01/09 0700)  Labs: Recent Labs    05/10/23 1228 05/10/23 2038 05/10/23 2252 05/11/23 0705  HGB 10.9*  --   --  9.5*  HCT 32.4*  --   --  29.1*  PLT 268  --   --  262  APTT  --  >200* 50*  --   LABPROT  --  22.3*  --   --   INR  --  1.9*  --   --   HEPARINUNFRC  --  >1.10*  --   --   CREATININE 0.71  --   --  0.56  TROPONINIHS 5  --   --   --     Estimated Creatinine Clearance: 50.1 mL/min (by C-G formula based on SCr of 0.56 mg/dL).  Medical History: Past Medical History:  Diagnosis Date   Adenomatous colon polyp    Adrenal carcinoma, right (HCC) 12/28/2016   a.) high grade carcinoma with extensive necrosis --> s/p adrenalectomy   Anxiety    Aortic atherosclerosis (HCC)    Arthritis of right knee    Atrial fibrillation and flutter (HCC)    a.) CHA2DS2-VASc = 3 (age, sex, vascular disease history) as of 02/13/2023; b.) cardiac rate/rhythm maintained intrinsically without pharmacological intervention; no chronic OAC (does take low dose ASA)   B12 deficiency    Breast cancer (HCC) 1997   a.) stage IB (cT1a, cN0, cM0, G3, ER+, PR-, HER2-); s/p lumpectomy + XRT   COPD (chronic obstructive pulmonary disease) (HCC)    Coronary artery disease    a.) cCTA 12/09/2021: Ca2+ = 135 (80th %'ile for age/sex/race match contol) --> 25-49% pLAD and RCA distributions   Depression    Diastolic dysfunction    a.) TTE 12/11/2020, no RWMAs, norm RVSF, G1DD; b.) TTE 12/14/2021: EF 55-60, no RWMAs, norm RVSF, G1DD; c.) TTE  01/27/2023: EF 50-55%, no RWMAs, G1DD, mild RAE, mild MR, mod TR   Dry cough 03/02/2017   Dyspnea    Full dentures    GERD (gastroesophageal reflux disease)    History of hiatal hernia    HLD (hyperlipidemia)    Invasive carcinoma of breast (HCC) 11/15/2017   a.) path (+) for G3, ER -, PR -, HER2/neu - ; s/p lumpectomy 01/17/2018 + 4 cycles TC systemic chemotherapy + XRT   Long term current use of aspirin     Personal history of chemotherapy    Personal history of radiation therapy    Pneumothorax of left lung after biopsy 01/25/2023   a.) hypoxic in PACU requiring re-intubation --> transferred to ICU --> chest tube placed by PCCM   RLS (restless legs syndrome)    a.) on ropinirole    Skin cancer    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 Of note, she was discharged 2 days ago on apixaban  for DVT on 05/04/23. Bilateral LE ultrasound with no evidence of deep venous thrombosis. CT chest with segmental and subsegmental pulmonary emboli in the right lower lobe pulmonary arteries. Pharmacy was consulted to initiate patient on a heparin  infusion.   Baseline labs Hgb 10.9. PLT 268.  Goal of Therapy:  Heparin  level 0.3-0.7 units/ml aPTT 66-102 seconds Monitor platelets by anticoagulation protocol: Yes  Heparin /aPTT levels Date/Time HL/aPTT Clinical Assessment  05/11/2023@0915  aPTT = 132 SUPRAtherapeutic                   Plan:  Hold heparin  infusion for one hour Restart heparin  at 750 units/hour decreased from 850 units/hr Check aPTT in 6 hours after rate change Monitor CBC and heparin  level daily   Alfonso MARLA Buys, PharmD Pharmacy Resident  05/11/2023 10:00 AM

## 2023-05-11 NOTE — Hospital Course (Addendum)
 Denise Watts is a 68 y.o. female with medical history significant of multiple cancer including vocal cord squamous cell carcinoma, breast cancer, adrenal carcinoma, non-small cell lung cancer (currently is on chemotherapy for lung cancer), HLD, COPD, CAD, dCHF, depression with anxiety, A-fib and DVT on Eliquis , mycoplasma pneumonia, who presents with leg pain.  She also complaining of mild chest pain shortness of breath.  Small amount of hemoptysis.  CT angiogram of chest showed segmental and subsegmental pulmonary emboli in the right lower lobe, with positive right heart strain.  Also showed a lower thoracic descending aorta filling defect, may present thrombosed dissection. Patient was placed on heparin  drip, vascular surgery performed pulmonary angiogram on 1/10, did not see significant proximal PE, no thrombectomy is needed. Echocardiogram showed ejection fraction 60 to 65%, grade 2 diastolic dysfunction.  Right ventricular systolic function normal, pulmonary artery pressure normal. Patient was initially treated with heparin  drip, transition to Lovenox  injection since 1/10.  Patient currently medically stable for discharge.

## 2023-05-12 ENCOUNTER — Other Ambulatory Visit (HOSPITAL_COMMUNITY): Payer: Self-pay

## 2023-05-12 ENCOUNTER — Ambulatory Visit: Payer: Medicare HMO

## 2023-05-12 ENCOUNTER — Encounter: Payer: Self-pay | Admitting: Oncology

## 2023-05-12 ENCOUNTER — Encounter: Payer: Self-pay | Admitting: Vascular Surgery

## 2023-05-12 ENCOUNTER — Telehealth (HOSPITAL_COMMUNITY): Payer: Self-pay

## 2023-05-12 ENCOUNTER — Encounter
Admission: EM | Disposition: A | Discharge status: Home / Self Care | Payer: Self-pay | Source: Home / Self Care | Attending: Internal Medicine

## 2023-05-12 DIAGNOSIS — I2699 Other pulmonary embolism without acute cor pulmonale: Secondary | ICD-10-CM | POA: Diagnosis not present

## 2023-05-12 DIAGNOSIS — I480 Paroxysmal atrial fibrillation: Secondary | ICD-10-CM | POA: Diagnosis not present

## 2023-05-12 DIAGNOSIS — C3490 Malignant neoplasm of unspecified part of unspecified bronchus or lung: Secondary | ICD-10-CM | POA: Diagnosis not present

## 2023-05-12 DIAGNOSIS — I2609 Other pulmonary embolism with acute cor pulmonale: Secondary | ICD-10-CM | POA: Diagnosis not present

## 2023-05-12 HISTORY — PX: PULMONARY THROMBECTOMY: CATH118295

## 2023-05-12 LAB — ECHOCARDIOGRAM COMPLETE
AR max vel: 2.25 cm2
AV Area VTI: 2.52 cm2
AV Area mean vel: 2.25 cm2
AV Mean grad: 2 mm[Hg]
AV Peak grad: 3.9 mm[Hg]
Ao pk vel: 0.99 m/s
Area-P 1/2: 4.86 cm2
Height: 61 in
MV VTI: 2.85 cm2
S' Lateral: 2.1 cm
Weight: 1834.23 [oz_av]

## 2023-05-12 LAB — APTT
aPTT: 115 s — ABNORMAL HIGH (ref 24–36)
aPTT: 108 s — ABNORMAL HIGH (ref 24–36)

## 2023-05-12 LAB — CBC
HCT: 29.1 % — ABNORMAL LOW (ref 36.0–46.0)
Hemoglobin: 9.3 g/dL — ABNORMAL LOW (ref 12.0–15.0)
MCH: 33.5 pg (ref 26.0–34.0)
MCHC: 32 g/dL (ref 30.0–36.0)
MCV: 104.7 fL — ABNORMAL HIGH (ref 80.0–100.0)
Platelets: 239 10*3/uL (ref 150–400)
RBC: 2.78 MIL/uL — ABNORMAL LOW (ref 3.87–5.11)
RDW: 17 % — ABNORMAL HIGH (ref 11.5–15.5)
WBC: 2.5 10*3/uL — ABNORMAL LOW (ref 4.0–10.5)
nRBC: 0 % (ref 0.0–0.2)

## 2023-05-12 LAB — CULTURE, BLOOD (ROUTINE X 2)
Culture: NO GROWTH
Culture: NO GROWTH
Special Requests: ADEQUATE

## 2023-05-12 LAB — POTASSIUM: Potassium: 4.2 mmol/L (ref 3.5–5.1)

## 2023-05-12 LAB — HEPARIN LEVEL (UNFRACTIONATED): Heparin Unfractionated: >1.1 [IU]/mL — ABNORMAL HIGH (ref 0.30–0.70)

## 2023-05-12 SURGERY — PULMONARY THROMBECTOMY
Anesthesia: Moderate Sedation | Laterality: Bilateral

## 2023-05-12 MED ORDER — DIGOXIN 125 MCG PO TABS
0.0625 mg | ORAL_TABLET | Freq: Every day | ORAL | Status: DC
  Administered 2023-05-12: 0.0625 mg via ORAL
  Filled 2023-05-12 (×2): qty 0.5

## 2023-05-12 MED ORDER — MIDAZOLAM HCL 2 MG/2ML IJ SOLN
INTRAMUSCULAR | Status: DC | PRN
  Administered 2023-05-12: .5 mg via INTRAVENOUS
  Administered 2023-05-12: 2 mg via INTRAVENOUS

## 2023-05-12 MED ORDER — METHYLPREDNISOLONE SODIUM SUCC 125 MG IJ SOLR: 125.0000 mg | Freq: Once | INTRAMUSCULAR | Status: DC | PRN

## 2023-05-12 MED ORDER — CHLORHEXIDINE GLUCONATE CLOTH 2 % EX PADS
6.0000 | MEDICATED_PAD | Freq: Every day | CUTANEOUS | Status: DC
  Administered 2023-05-12 – 2023-05-13 (×2): 6 via TOPICAL

## 2023-05-12 MED ORDER — FENTANYL CITRATE (PF) 100 MCG/2ML IJ SOLN
INTRAMUSCULAR | Status: DC | PRN
  Administered 2023-05-12: 50 ug via INTRAVENOUS
  Administered 2023-05-12: 25 ug via INTRAVENOUS

## 2023-05-12 MED ORDER — ENOXAPARIN SODIUM 60 MG/0.6ML IJ SOSY
1.0000 mg/kg | PREFILLED_SYRINGE | Freq: Two times a day (BID) | INTRAMUSCULAR | Status: DC
  Administered 2023-05-12 – 2023-05-13 (×2): 52.5 mg via SUBCUTANEOUS
  Filled 2023-05-12 (×2): qty 0.6

## 2023-05-12 MED ORDER — HEPARIN (PORCINE) IN NACL 1000-0.9 UT/500ML-% IV SOLN
INTRAVENOUS | Status: DC | PRN
  Administered 2023-05-12: 1000 mL

## 2023-05-12 MED ORDER — FENTANYL CITRATE (PF) 100 MCG/2ML IJ SOLN
INTRAMUSCULAR | Status: AC
  Filled 2023-05-12: qty 2

## 2023-05-12 MED ORDER — CEFAZOLIN SODIUM-DEXTROSE 2-4 GM/100ML-% IV SOLN
INTRAVENOUS | Status: AC
  Filled 2023-05-12: qty 100

## 2023-05-12 MED ORDER — OMEPRAZOLE 2 MG/ML ORAL SUSPENSION
40.0000 mg | Freq: Two times a day (BID) | ORAL | Status: DC
  Administered 2023-05-12 – 2023-05-13 (×2): 40 mg via ORAL
  Filled 2023-05-12 (×5): qty 20

## 2023-05-12 MED ORDER — FAMOTIDINE 20 MG PO TABS: 40.0000 mg | ORAL_TABLET | Freq: Once | ORAL | Status: DC | PRN

## 2023-05-12 MED ORDER — DIPHENHYDRAMINE HCL 50 MG/ML IJ SOLN: 50.0000 mg | Freq: Once | INTRAMUSCULAR | Status: DC | PRN

## 2023-05-12 MED ORDER — MIDAZOLAM HCL 5 MG/5ML IJ SOLN
INTRAMUSCULAR | Status: AC
  Filled 2023-05-12: qty 5

## 2023-05-12 MED ORDER — IODIXANOL 320 MG/ML IV SOLN
INTRAVENOUS | Status: DC | PRN
  Administered 2023-05-12: 35 mL

## 2023-05-12 MED ORDER — MIDAZOLAM HCL 2 MG/ML PO SYRP: 8.0000 mg | ORAL_SOLUTION | Freq: Once | ORAL | Status: DC | PRN

## 2023-05-12 MED ORDER — HEPARIN SODIUM (PORCINE) 1000 UNIT/ML IJ SOLN
INTRAMUSCULAR | Status: AC
  Filled 2023-05-12: qty 10

## 2023-05-12 MED ORDER — LIDOCAINE-EPINEPHRINE (PF) 1 %-1:200000 IJ SOLN
INTRAMUSCULAR | Status: DC | PRN
  Administered 2023-05-12: 10 mL

## 2023-05-12 MED ORDER — FENTANYL CITRATE PF 50 MCG/ML IJ SOSY: 12.5000 ug | PREFILLED_SYRINGE | Freq: Once | INTRAMUSCULAR | Status: DC | PRN

## 2023-05-12 MED ORDER — CEFAZOLIN SODIUM-DEXTROSE 2-4 GM/100ML-% IV SOLN
2.0000 g | INTRAVENOUS | Status: AC
  Administered 2023-05-12: 2 g via INTRAVENOUS

## 2023-05-12 MED ORDER — ROPINIROLE HCL 0.25 MG PO TABS
0.2500 mg | ORAL_TABLET | Freq: Every day | ORAL | Status: DC
  Administered 2023-05-12: 0.25 mg via ORAL
  Filled 2023-05-12: qty 1

## 2023-05-12 MED ORDER — HEPARIN (PORCINE) 25000 UT/250ML-% IV SOLN
INTRAVENOUS | Status: AC
  Filled 2023-05-12: qty 250

## 2023-05-12 MED ORDER — FENTANYL 12 MCG/HR TD PT72
1.0000 | MEDICATED_PATCH | TRANSDERMAL | Status: DC
  Administered 2023-05-12: 1 via TRANSDERMAL
  Filled 2023-05-12: qty 1

## 2023-05-12 MED ORDER — SODIUM CHLORIDE 0.9 % IV SOLN: INTRAVENOUS | Status: DC

## 2023-05-12 SURGICAL SUPPLY — 15 items
CANISTER PENUMBRA ENGINE (MISCELLANEOUS) IMPLANT
CATH ANGIO 5F PIGTAIL 100CM (CATHETERS) IMPLANT
CATH INDIGO SEP 8 (CATHETERS) IMPLANT
CATH LIGHTNING 8 XTORQ 115 (CATHETERS) IMPLANT
CLOSURE PERCLOSE PROSTYLE (VASCULAR PRODUCTS) IMPLANT
COVER PROBE ULTRASOUND 5X96 (MISCELLANEOUS) IMPLANT
GLIDEWIRE ADV .035X260CM (WIRE) IMPLANT
PACK ANGIOGRAPHY (CUSTOM PROCEDURE TRAY) ×1 IMPLANT
SHEATH 9FRX11 (SHEATH) IMPLANT
SHEATH BRITE TIP 6FRX11 (SHEATH) IMPLANT
SUT MNCRL AB 4-0 PS2 18 (SUTURE) IMPLANT
SYR MEDRAD MARK 7 150ML (SYRINGE) IMPLANT
TUBING CONTRAST HIGH PRESS 72 (TUBING) IMPLANT
WIRE GUIDERIGHT .035X150 (WIRE) IMPLANT
WIRE SUPRACORE 300CM (WIRE) IMPLANT

## 2023-05-12 NOTE — Progress Notes (Signed)
 PHARMACY - ANTICOAGULATION CONSULT NOTE  Pharmacy Consult for Heparin  Infusion Indication: pulmonary embolus  Allergies  Allergen Reactions   Sulfa  Antibiotics Other (See Comments)    Mouth sore and raw   Patient Measurements: Height: 5' 1 (154.9 cm) Weight: 52 kg (114 lb 10.2 oz) IBW/kg (Calculated) : 47.8 Heparin  Dosing Weight: 52 kg   Vital Signs: Temp: 97.8 F (36.6 C) (01/10 1154) Temp Source: Oral (01/10 1154) BP: 89/68 (01/10 1154) Pulse Rate: 103 (01/10 1154)  Labs: Recent Labs    05/10/23 1228 05/10/23 2038 05/10/23 2252 05/11/23 0705 05/11/23 0915 05/11/23 1903 05/12/23 0148 05/12/23 0838  HGB 10.9*  --   --  9.5*  --   --  9.3*  --   HCT 32.4*  --   --  29.1*  --   --  29.1*  --   PLT 268  --   --  262  --   --  239  --   APTT  --  >200*   < >  --  132* 115* 115* 108*  LABPROT  --  22.3*  --   --   --   --   --   --   INR  --  1.9*  --   --   --   --   --   --   HEPARINUNFRC  --  >1.10*  --   --  >1.10*  --  >1.10*  --   CREATININE 0.71  --   --  0.56  --   --   --   --   TROPONINIHS 5  --   --   --   --   --   --   --    < > = values in this interval not displayed.    Estimated Creatinine Clearance: 50.1 mL/min (by C-G formula based on SCr of 0.56 mg/dL).  Medical History: Past Medical History:  Diagnosis Date   Adenomatous colon polyp    Adrenal carcinoma, right (HCC) 12/28/2016   a.) high grade carcinoma with extensive necrosis --> s/p adrenalectomy   Anxiety    Aortic atherosclerosis (HCC)    Arthritis of right knee    Atrial fibrillation and flutter (HCC)    a.) CHA2DS2-VASc = 3 (age, sex, vascular disease history) as of 02/13/2023; b.) cardiac rate/rhythm maintained intrinsically without pharmacological intervention; no chronic OAC (does take low dose ASA)   B12 deficiency    Breast cancer (HCC) 1997   a.) stage IB (cT1a, cN0, cM0, G3, ER+, PR-, HER2-); s/p lumpectomy + XRT   COPD (chronic obstructive pulmonary disease) (HCC)     Coronary artery disease    a.) cCTA 12/09/2021: Ca2+ = 135 (80th %'ile for age/sex/race match contol) --> 25-49% pLAD and RCA distributions   Depression    Diastolic dysfunction    a.) TTE 12/11/2020, no RWMAs, norm RVSF, G1DD; b.) TTE 12/14/2021: EF 55-60, no RWMAs, norm RVSF, G1DD; c.) TTE 01/27/2023: EF 50-55%, no RWMAs, G1DD, mild RAE, mild MR, mod TR   Dry cough 03/02/2017   Dyspnea    Full dentures    GERD (gastroesophageal reflux disease)    History of hiatal hernia    HLD (hyperlipidemia)    Invasive carcinoma of breast (HCC) 11/15/2017   a.) path (+) for G3, ER -, PR -, HER2/neu - ; s/p lumpectomy 01/17/2018 + 4 cycles TC systemic chemotherapy + XRT   Long term current use of aspirin     Personal history of  chemotherapy    Personal history of radiation therapy    Pneumothorax of left lung after biopsy 01/25/2023   a.) hypoxic in PACU requiring re-intubation --> transferred to ICU --> chest tube placed by PCCM   RLS (restless legs syndrome)    a.) on ropinirole    Skin cancer    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 Of note, she was discharged 2 days ago on apixaban  for DVT on 05/04/23. Bilateral LE ultrasound with no evidence of deep venous thrombosis. CT chest with segmental and subsegmental pulmonary emboli in the right lower lobe pulmonary arteries. Pharmacy was consulted to initiate patient on a heparin  infusion.   1/10: Pulmonary thrombectomy completed. Heparin  infusion restarted @ 1045. Stopped at [facility]. Transition to enoxaparin  therapeutic dose per MD request.  Baseline labs Hgb 10.9. PLT 268.  Goal of Therapy:  Heparin  level 0.3-0.7 units/ml aPTT 66-102 seconds Monitor platelets by  anticoagulation protocol: Yes  Heparin /aPTT levels Date/Time HL/aPTT Clinical Assessment  1/9 @ 0915 HL > 1.10/aPTT = 132 SUPRAtherapeutic  1/9 @ 1903 aPTT = 115  SUPRAtherapeutic   1/10 0148 HL > 1.1 / aPTT 115 Supratherapeutic   Plan:  Stop heparin  infusion Start enoxaparin  1mg /kg q 12hrs fro PE treatment Monitor CBC at least weekly while on enoxaparin  Anti-Xa level indicated for long term enoxaparin  therapy. Will order if needed once at steady state.  Trenell Concannon Rodriguez-Guzman PharmD, BCPS 05/12/2023 12:55 PM

## 2023-05-12 NOTE — Progress Notes (Signed)
 Progress Note   Patient: Denise Watts FMW:[id number] DOB: 1956/04/08 DOA: 05/10/2023     2 DOS: the patient was seen and examined on 05/12/2023   Brief hospital course: Denise Watts is a 68 y.o. female with medical history significant of multiple cancer including vocal cord squamous cell carcinoma, breast cancer, adrenal carcinoma, non-small cell lung cancer (currently is on chemotherapy for lung cancer), HLD, COPD, CAD, dCHF, depression with anxiety, A-fib and DVT on Eliquis , mycoplasma pneumonia, who presents with leg pain.  She also complaining of mild chest pain shortness of breath.  Small amount of hemoptysis.  CT angiogram of chest showed segmental and subsegmental pulmonary emboli in the right lower lobe, with positive right heart strain.  Also showed a lower thoracic descending aorta filling defect, may present thrombosed dissection. Patient was placed on heparin  drip, vascular surgery consult obtained.   Principal Problem:   Acute pulmonary embolism (HCC) Active Problems:   Cellulitis of lower extremity   PAF (paroxysmal atrial fibrillation) (HCC)   COPD (chronic obstructive pulmonary disease) (HCC)   Chronic diastolic CHF (congestive heart failure) (HCC)   HLD (hyperlipidemia)   CAD (coronary artery disease)   Non-small cell lung cancer (HCC)   Depression with anxiety   Hypokalemia   Pancytopenia (HCC)   Assessment and Plan: Acute pulmonary embolism (HCC): CTA showed segmental and subsegmental pulmonary emboli in the right lower lobe pulmonary arteries. Positive for acute PE with CT evidence of right heart strain (RV/LV Ratio = 1.2) consistent with at least submassive (intermediate risk) PE.  Patient is currently hemodynamically stable.   Patient was just recently started on Eliquis , placed on heparin  drip after admission.  Patient had pulmonary angiogram, did not show significant central thrombus, no intervention required. At this point, I will transition anticoagulation to  Lovenox  as planned.  Patient probably will be discharged home tomorrow.   Cellulitis of lower extremity: no fever -Continue home Duricef and doxycycline  Condition much improved, will not need antibiotics at time of discharge.   Paroxysmal atrial fibrillation with RVR. Heart rate still little tachycardic, blood pressure is borderline.  Renal function is normal, will give small dose of digoxin .  If her blood pressure is better, will change to metoprolol .  Anticoagulation as above.    COPD (chronic obstructive pulmonary disease) (HCC) -Bronchodilators and as needed Mucinex    Chronic diastolic CHF (congestive heart failure) (HCC): BNP normal 125, does not seem to have CHF exacerbation.  2D echo on 01/27/2023 showed EF of 50-55% with grade 1 diastolic dysfunction. no volume overload.   HLD (hyperlipidemia) -Crestor    CAD (coronary artery disease) -Crestor    Non-small cell lung cancer (HCC) Pancytopenia secondary to chemotherapy. On chemotherapy, last treatment was about 10 days ago. -Follow-up with oncology   Depression with anxiety -Continue home medications   Possible ascending aorta thrombosed dissection: CTA incidentally showed crescentic filling defect in the lower thoracic descending aorta extending into the upper abdominal aorta is not definitively seen on prior imaging and may represent a thrombosed dissection. Discussed with Dr. Marea, he has reviewed patient's CT scan images, currently patient does not need surgery.  Recommended follow-up with him in the office.        Subjective:  Patient has some constipation, mild leg pain.  Physical Exam: Vitals:   05/12/23 1100 05/12/23 1115 05/12/23 1130 05/12/23 1154  BP: 111/74 96/65 100/72 (!) 89/68  Pulse: (!) 108 (!) 104 100 (!) 103  Resp: 14 13 14 18   Temp:    97.8  F (36.6 C)  TempSrc:    Oral  SpO2: 96% 93% 93% 98%  Weight:      Height:       General exam: Appears calm and comfortable  Respiratory system: Clear  to auscultation. Respiratory effort normal. Cardiovascular system: Irregular. No JVD, murmurs, rubs, gallops or clicks. No pedal edema. Gastrointestinal system: Abdomen is nondistended, soft and nontender. No organomegaly or masses felt. Normal bowel sounds heard. Central nervous system: Alert and oriented. No focal neurological deficits. Extremities: minimal leg edema on left, much improved. Skin: No rashes, lesions or ulcers Psychiatry: Judgement and insight appear normal. Mood & affect appropriate.    Data Reviewed:  Lab results reviewed.  Family Communication: Daughter updated at bedside.  Disposition: Status is: Inpatient Remains inpatient appropriate because: Severity of disease, IV treatment.     Time spent: 35 minutes  Author: Murvin Mana, MD 05/12/2023 12:20 PM  For on call review [email or web address].

## 2023-05-12 NOTE — Telephone Encounter (Signed)
 Pharmacy Patient Advocate Encounter  Insurance verification completed.    The patient is insured through [employer]. Patient has Medicare and is not eligible for a copay card, but may be able to apply for patient assistance or Medicare RX Payment Plan (Patient Must reach out to their plan, if eligible for payment plan), if available.    Ran test claim for Enoxaparin  and the current 30 day co-pay is $4.90.  Ran test claim for Xarelto and the current 30 day co-pay is $12.15.  This test claim was processed through [facility]- copay amounts may vary at other pharmacies due to pharmacy/plan contracts, or as the patient moves through the different stages of their insurance plan.

## 2023-05-12 NOTE — Op Note (Signed)
 [facility]  Percutaneous Study/Intervention Procedural Note   Date of Surgery: 05/12/2023,10:25 AM  Surgeon: Selinda Gu  Pre-operative Diagnosis: Symptomatic right sided pulmonary emboli  Post-operative diagnosis:  Same  Procedure(s) Performed:  1.  Contrast injection right heart  2.  Mechanical thrombectomy to the right middle and lower lobe pulmonary arteries using the penumbra CAT 8 catheter  3.  Selective catheter placement left lower lobe and upper lobe pulmonary arteries  4.  Selective catheter placement right lower lobe, middle lobe, and upper lobe pulmonary arteries     Anesthesia: Conscious sedation was administered under my direct supervision by the interventional radiology RN. IV Versed  plus fentanyl  were utilized. Continuous ECG, pulse oximetry and blood pressure was monitored throughout the entire procedure.  Versed  and fentanyl  were administered intravenously.  Conscious sedation was administered for a total of 37 minutes using 2.5 mg of Versed  and 75 mcg of Fentanyl .  EBL: 250 cc  Sheath: 9 French right femoral vein  Contrast: 35 cc   Fluoroscopy Time: 5.6 minutes  Indications:  Patient presents with pulmonary emboli. The patient is symptomatic with hypoxemia and dyspnea on exertion.  There is evidence of right heart strain on the CT angiogram. The patient is otherwise a good candidate for intervention and even the long-term benefits pulmonary angiography with thrombolysis is offered. The risks and benefits are reviewed long-term benefits are discussed. All questions are answered patient agrees to proceed.  Procedure:  KARLENE SOUTHARD a 68 y.o. female who was identified and appropriate procedural time out was performed.  The patient was then placed supine on the table and prepped and draped in the usual sterile fashion.  Ultrasound was used to evaluate the right common femoral vein.  It was patent, as it was echolucent and compressible.  A digital  ultrasound image was acquired for the permanent record.  A Seldinger needle was used to access the right common femoral vein under direct ultrasound guidance.  A 0.035 J wire was advanced without resistance and a 6Fr sheath was placed. A proglide device was placed in a preclose fashion and then upsized to a 9 French sheath.    The Advantage wire and pigtail catheter were then negotiated into the right atrium and bolus injection of contrast was utilized to demonstrate the right ventricle and the pulmonary artery outflow. The Advantage wire and catheter were then negotiated into the the pulmonary arteries.  The left pulmonary artery was cannulated and advanced into the left upper lobe and left lower lobe for selective imaging. This demonstrated no apparent thrombus burden on the left side.  I then transitioned to the right side with the advantage wire and catheter and first cannulated the right lower lobe and then the right middle and upper lobes.  This demonstrated significant thrombus burden in the right lower lobe with a small amount of thrombus in the right middle lobe.  No obvious thrombus in the right upper lobe.  3000 Units of heparin  was then given and allowed to circulate.   The Penumbra Cat 8 catheter was then advanced up into the pulmonary vasculature. The right lung was addressed. Catheter was negotiated into the right lower lobe and mechanical thrombectomy was performed with the help of the separator.  The majority of the time was sent in the right lower lobe pulmonary artery due to this being a large thrombus burden.  The catheter was renegotiated into the right middle lobe pulmonary artery and again mechanical thrombectomy was performed. Passes were  made with both the Penumbra catheter itself as well as introducing the separator. Follow-up imaging was then performed.  Improvement was seen and so I elected to terminate the procedure.   After review these images wires were reintroduced and the  catheter is removed. Then, the sheath is then pulled, the proglide device is secured, a Monocryl skin suture was placed and pressure is held. A sterile dressing is placed.    Findings:   Right heart imaging:  Right atrium and right ventricle and the pulmonary outflow tract appears fairly normal  Right lung:  This demonstrated significant thrombus burden in the right lower lobe with a small amount of thrombus in the right middle lobe.  No obvious thrombus in the right upper lobe.  Left lung:  This demonstrated no apparent thrombus burden on the left side.     Disposition: Patient was taken to the recovery room in stable condition having tolerated the procedure well.  Nysir Fergusson 05/12/2023,10:25 AM

## 2023-05-12 NOTE — Interval H&P Note (Signed)
 History and Physical Interval Note:  05/12/2023 9:16 AM  Denise Watts  has presented today for surgery, with the diagnosis of pulmonary Embolism.  The various methods of treatment have been discussed with the patient and family. After consideration of risks, benefits and other options for treatment, the patient has consented to  Procedure(s): PULMONARY THROMBECTOMY (Bilateral) as a surgical intervention.  The patient's history has been reviewed, patient examined, no change in status, stable for surgery.  I have reviewed the patient's chart and labs.  Questions were answered to the patient's satisfaction.     Taurus Alamo

## 2023-05-12 NOTE — Progress Notes (Signed)
 PHARMACY - ANTICOAGULATION CONSULT NOTE  Pharmacy Consult for Heparin  Infusion Indication: pulmonary embolus  Allergies  Allergen Reactions   Sulfa  Antibiotics Other (See Comments)    Mouth sore and raw   Patient Measurements: Height: 5' 1 (154.9 cm) Weight: 52 kg (114 lb 10.2 oz) IBW/kg (Calculated) : 47.8 Heparin  Dosing Weight: 52 kg   Vital Signs: Temp: 98 F (36.7 C) (01/10 0901) Temp Source: Oral (01/10 0901) BP: 96/65 (01/10 1115) Pulse Rate: 104 (01/10 1115)  Labs: Recent Labs    05/10/23 1228 05/10/23 2038 05/10/23 2252 05/11/23 0705 05/11/23 0915 05/11/23 1903 05/12/23 0148 05/12/23 0838  HGB 10.9*  --   --  9.5*  --   --  9.3*  --   HCT 32.4*  --   --  29.1*  --   --  29.1*  --   PLT 268  --   --  262  --   --  239  --   APTT  --  >200*   < >  --  132* 115* 115* 108*  LABPROT  --  22.3*  --   --   --   --   --   --   INR  --  1.9*  --   --   --   --   --   --   HEPARINUNFRC  --  >1.10*  --   --  >1.10*  --  >1.10*  --   CREATININE 0.71  --   --  0.56  --   --   --   --   TROPONINIHS 5  --   --   --   --   --   --   --    < > = values in this interval not displayed.    Estimated Creatinine Clearance: 50.1 mL/min (by C-G formula based on SCr of 0.56 mg/dL).  Medical History: Past Medical History:  Diagnosis Date   Adenomatous colon polyp    Adrenal carcinoma, right (HCC) 12/28/2016   a.) high grade carcinoma with extensive necrosis --> s/p adrenalectomy   Anxiety    Aortic atherosclerosis (HCC)    Arthritis of right knee    Atrial fibrillation and flutter (HCC)    a.) CHA2DS2-VASc = 3 (age, sex, vascular disease history) as of 02/13/2023; b.) cardiac rate/rhythm maintained intrinsically without pharmacological intervention; no chronic OAC (does take low dose ASA)   B12 deficiency    Breast cancer (HCC) 1997   a.) stage IB (cT1a, cN0, cM0, G3, ER+, PR-, HER2-); s/p lumpectomy + XRT   COPD (chronic obstructive pulmonary disease) (HCC)    Coronary  artery disease    a.) cCTA 12/09/2021: Ca2+ = 135 (80th %'ile for age/sex/race match contol) --> 25-49% pLAD and RCA distributions   Depression    Diastolic dysfunction    a.) TTE 12/11/2020, no RWMAs, norm RVSF, G1DD; b.) TTE 12/14/2021: EF 55-60, no RWMAs, norm RVSF, G1DD; c.) TTE 01/27/2023: EF 50-55%, no RWMAs, G1DD, mild RAE, mild MR, mod TR   Dry cough 03/02/2017   Dyspnea    Full dentures    GERD (gastroesophageal reflux disease)    History of hiatal hernia    HLD (hyperlipidemia)    Invasive carcinoma of breast (HCC) 11/15/2017   a.) path (+) for G3, ER -, PR -, HER2/neu - ; s/p lumpectomy 01/17/2018 + 4 cycles TC systemic chemotherapy + XRT   Long term current use of aspirin     Personal history of  chemotherapy    Personal history of radiation therapy    Pneumothorax of left lung after biopsy 01/25/2023   a.) hypoxic in PACU requiring re-intubation --> transferred to ICU --> chest tube placed by PCCM   RLS (restless legs syndrome)    a.) on ropinirole    Skin cancer    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 Of note, she was discharged 2 days ago on apixaban  for DVT on 05/04/23. Bilateral LE ultrasound with no evidence of deep venous thrombosis. CT chest with segmental and subsegmental pulmonary emboli in the right lower lobe pulmonary arteries. Pharmacy was consulted to initiate patient on a heparin  infusion.   1/10: Pulmonary thrombectomy completed. Heparin  infusion restarted @ 1045  Baseline labs Hgb 10.9. PLT 268.  Goal of Therapy:  Heparin  level 0.3-0.7 units/ml aPTT 66-102 seconds Monitor platelets by anticoagulation protocol: Yes  Heparin /aPTT levels Date/Time HL/aPTT Clinical Assessment   1/9 @ 0915 HL > 1.10/aPTT = 132 SUPRAtherapeutic  1/9 @ 1903 aPTT = 115  SUPRAtherapeutic   1/10 0148 HL > 1.1 / aPTT 115 Supratherapeutic           Plan:  Heparin  infusion stopped prior to thrombectomy. Resumed 1/10 @ 1045 post-procedure Continue heparin  infusion at 550 units/hr  Check next aPTT 6 hours after restart Monitor aPTT levels until correlation with HL level  Monitor CBC and heparin  level daily   Navjot Loera Rodriguez-Guzman PharmD, BCPS 05/12/2023 11:55 AM

## 2023-05-12 NOTE — Progress Notes (Signed)
 PHARMACY - ANTICOAGULATION CONSULT NOTE  Pharmacy Consult for Heparin  Infusion Indication: pulmonary embolus  Allergies  Allergen Reactions   Sulfa  Antibiotics Other (See Comments)    Mouth sore and raw   Patient Measurements: Height: 5' 1 (154.9 cm) Weight: 52 kg (114 lb 10.2 oz) IBW/kg (Calculated) : 47.8 Heparin  Dosing Weight: 52 kg   Vital Signs: Temp: 98.1 F (36.7 C) (01/10 0201) Temp Source: Oral (01/10 0201) BP: 111/62 (01/10 0000) Pulse Rate: 102 (01/10 0000)  Labs: Recent Labs    05/10/23 1228 05/10/23 2038 05/10/23 2252 05/11/23 0705 05/11/23 0915 05/11/23 1903 05/12/23 0148  HGB 10.9*  --   --  9.5*  --   --  9.3*  HCT 32.4*  --   --  29.1*  --   --  29.1*  PLT 268  --   --  262  --   --  239  APTT  --  >200*   < >  --  132* 115* 115*  LABPROT  --  22.3*  --   --   --   --   --   INR  --  1.9*  --   --   --   --   --   HEPARINUNFRC  --  >1.10*  --   --  >1.10*  --  >1.10*  CREATININE 0.71  --   --  0.56  --   --   --   TROPONINIHS 5  --   --   --   --   --   --    < > = values in this interval not displayed.    Estimated Creatinine Clearance: 50.1 mL/min (by C-G formula based on SCr of 0.56 mg/dL).  Medical History: Past Medical History:  Diagnosis Date   Adenomatous colon polyp    Adrenal carcinoma, right (HCC) 12/28/2016   a.) high grade carcinoma with extensive necrosis --> s/p adrenalectomy   Anxiety    Aortic atherosclerosis (HCC)    Arthritis of right knee    Atrial fibrillation and flutter (HCC)    a.) CHA2DS2-VASc = 3 (age, sex, vascular disease history) as of 02/13/2023; b.) cardiac rate/rhythm maintained intrinsically without pharmacological intervention; no chronic OAC (does take low dose ASA)   B12 deficiency    Breast cancer (HCC) 1997   a.) stage IB (cT1a, cN0, cM0, G3, ER+, PR-, HER2-); s/p lumpectomy + XRT   COPD (chronic obstructive pulmonary disease) (HCC)    Coronary artery disease    a.) cCTA 12/09/2021: Ca2+ = 135 (80th  %'ile for age/sex/race match contol) --> 25-49% pLAD and RCA distributions   Depression    Diastolic dysfunction    a.) TTE 12/11/2020, no RWMAs, norm RVSF, G1DD; b.) TTE 12/14/2021: EF 55-60, no RWMAs, norm RVSF, G1DD; c.) TTE 01/27/2023: EF 50-55%, no RWMAs, G1DD, mild RAE, mild MR, mod TR   Dry cough 03/02/2017   Dyspnea    Full dentures    GERD (gastroesophageal reflux disease)    History of hiatal hernia    HLD (hyperlipidemia)    Invasive carcinoma of breast (HCC) 11/15/2017   a.) path (+) for G3, ER -, PR -, HER2/neu - ; s/p lumpectomy 01/17/2018 + 4 cycles TC systemic chemotherapy + XRT   Long term current use of aspirin     Personal history of chemotherapy    Personal history of radiation therapy    Pneumothorax of left lung after biopsy 01/25/2023   a.) hypoxic in PACU requiring re-intubation -->  transferred to ICU --> chest tube placed by PCCM   RLS (restless legs syndrome)    a.) on ropinirole    Skin cancer    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 Of note, she was discharged 2 days ago on apixaban  for DVT on 05/04/23. Bilateral LE ultrasound with no evidence of deep venous thrombosis. CT chest with segmental and subsegmental pulmonary emboli in the right lower lobe pulmonary arteries. Pharmacy was consulted to initiate patient on a heparin  infusion.   Baseline labs Hgb 10.9. PLT 268.  Goal of Therapy:  Heparin  level 0.3-0.7 units/ml aPTT 66-102 seconds Monitor platelets by anticoagulation protocol: Yes  Heparin /aPTT levels Date/Time HL/aPTT Clinical Assessment  1/9 @ 0915 HL > 1.10/aPTT = 132 SUPRAtherapeutic  1/9 @ 1903 aPTT = 115  SUPRAtherapeutic   1/10 0148 HL > 1.1 / aPTT 115  Supratherapeutic           Plan:  aPTT supratherapeutic  Decrease rate of infusion to 550 units/hr  Check next aPTT 6 hours after rate change  Monitor aPTT levels until correlation with HL level  Monitor CBC and heparin  level daily   Rankin CANDIE Dills, PharmD, [facility] 05/12/2023 3:23 AM

## 2023-05-13 DIAGNOSIS — D61818 Other pancytopenia: Secondary | ICD-10-CM

## 2023-05-13 DIAGNOSIS — L03119 Cellulitis of unspecified part of limb: Secondary | ICD-10-CM | POA: Diagnosis not present

## 2023-05-13 DIAGNOSIS — J439 Emphysema, unspecified: Secondary | ICD-10-CM | POA: Diagnosis not present

## 2023-05-13 DIAGNOSIS — I2609 Other pulmonary embolism with acute cor pulmonale: Secondary | ICD-10-CM | POA: Diagnosis not present

## 2023-05-13 DIAGNOSIS — C3490 Malignant neoplasm of unspecified part of unspecified bronchus or lung: Secondary | ICD-10-CM | POA: Diagnosis not present

## 2023-05-13 LAB — CBC
HCT: 30 % — ABNORMAL LOW (ref 36.0–46.0)
Hemoglobin: 9.5 g/dL — ABNORMAL LOW (ref 12.0–15.0)
MCH: 32.8 pg (ref 26.0–34.0)
MCHC: 31.7 g/dL (ref 30.0–36.0)
MCV: 103.4 fL — ABNORMAL HIGH (ref 80.0–100.0)
Platelets: 228 10*3/uL (ref 150–400)
RBC: 2.9 MIL/uL — ABNORMAL LOW (ref 3.87–5.11)
RDW: 16.7 % — ABNORMAL HIGH (ref 11.5–15.5)
WBC: 2.5 10*3/uL — ABNORMAL LOW (ref 4.0–10.5)
nRBC: 0 % (ref 0.0–0.2)

## 2023-05-13 MED ORDER — HEPARIN SOD (PORK) LOCK FLUSH 100 UNIT/ML IV SOLN
500.0000 [IU] | Freq: Once | INTRAVENOUS | Status: AC
  Administered 2023-05-13: 500 [IU] via INTRAVENOUS
  Filled 2023-05-13: qty 5

## 2023-05-13 MED ORDER — ENOXAPARIN SODIUM 60 MG/0.6ML IJ SOSY: 52.0000 mg | PREFILLED_SYRINGE | Freq: Two times a day (BID) | INTRAMUSCULAR | 0 refills | Status: DC

## 2023-05-13 MED ORDER — METOPROLOL SUCCINATE ER 25 MG PO TB24: 25.0000 mg | ORAL_TABLET | Freq: Every day | ORAL | 0 refills | Status: DC

## 2023-05-13 MED ORDER — METOPROLOL SUCCINATE ER 25 MG PO TB24
25.0000 mg | ORAL_TABLET | Freq: Every day | ORAL | Status: DC
  Administered 2023-05-13: 25 mg via ORAL
  Filled 2023-05-13: qty 1

## 2023-05-13 NOTE — Plan of Care (Signed)

## 2023-05-13 NOTE — Discharge Summary (Signed)
 Physician Discharge Summary   Patient: Denise Watts MRN: [id number] DOB: 1955-10-19  Admit date:     05/10/2023  Discharge date: 05/13/23  Discharge Physician: Murvin Mana   PCP: [name], DO   Recommendations at discharge:   Follow-up with PCP in 1 week. Follow-up with oncology as previously scheduled.  Discharge Diagnoses: Principal Problem:   Acute pulmonary embolism (HCC) Active Problems:   Cellulitis of lower extremity   PAF (paroxysmal atrial fibrillation) (HCC)   COPD (chronic obstructive pulmonary disease) (HCC)   Chronic diastolic CHF (congestive heart failure) (HCC)   HLD (hyperlipidemia)   CAD (coronary artery disease)   Non-small cell lung cancer (HCC)   Depression with anxiety   Hypokalemia   Pancytopenia (HCC)  Resolved Problems:   * No resolved hospital problems. * Hypokalemia. Hospital Course: Denise Watts is a 68 y.o. female with medical history significant of multiple cancer including vocal cord squamous cell carcinoma, breast cancer, adrenal carcinoma, non-small cell lung cancer (currently is on chemotherapy for lung cancer), HLD, COPD, CAD, dCHF, depression with anxiety, A-fib and DVT on Eliquis , mycoplasma pneumonia, who presents with leg pain.  She also complaining of mild chest pain shortness of breath.  Small amount of hemoptysis.  CT angiogram of chest showed segmental and subsegmental pulmonary emboli in the right lower lobe, with positive right heart strain.  Also showed a lower thoracic descending aorta filling defect, may present thrombosed dissection. Patient was placed on heparin  drip, vascular surgery performed pulmonary angiogram on 1/10, did not see significant proximal PE, no thrombectomy is needed. Echocardiogram showed ejection fraction 60 to 65%, grade 2 diastolic dysfunction.  Right ventricular systolic function normal, pulmonary artery pressure normal. Patient was initially treated with heparin  drip, transition to Lovenox  injection  since 1/10.  Patient currently medically stable for discharge.  Assessment and Plan:  Acute pulmonary embolism [facility]): CTA showed segmental and subsegmental pulmonary emboli in the right lower lobe pulmonary arteries. Positive for acute PE with CT evidence of right heart strain (RV/LV Ratio = 1.2) consistent with at least submassive (intermediate risk) PE.  Patient is currently hemodynamically stable.   Patient was just recently started on Eliquis , placed on heparin  drip after admission.  Patient had pulmonary angiogram, did not show significant central thrombus, no intervention required. Patient anticoagulation has changed to Lovenox  based on weight.  Patient will be followed by PCP as outpatient.   Cellulitis of lower extremity: no fever -Continue home Duricef and doxycycline  Condition essentially resolved, patient will finish the dose by tomorrow.   Paroxysmal atrial fibrillation with RVR. Heart rate still little tachycardic, blood pressure is borderline.  Renal function is normal, received small dose of digoxin .    Anticoagulation as above. Blood pressure much better today, no need for digoxin , will start 25 mg of metoprolol  daily.     COPD (chronic obstructive pulmonary disease) (HCC) -Bronchodilators and as needed Mucinex    Chronic diastolic CHF (congestive heart failure) (HCC): BNP normal 125, does not seem to have CHF exacerbation.  2D echo on 01/27/2023 showed EF of 50-55% with grade 1 diastolic dysfunction. no volume overload.   HLD (hyperlipidemia) -Crestor    CAD (coronary artery disease) -Crestor    Non-small cell lung cancer (HCC) Pancytopenia secondary to chemotherapy. On chemotherapy, last treatment was about 10 days ago. 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 Discussed with Dr. Marea, he has reviewed patient's CT scan images, currently patient does not need surgery.  Recommended follow-up with him in the office.  Ordered for 2 weeks follow-up.      Consultants: Vascular surgery. Procedures performed: Pulmonary angiography.  Disposition: Home Diet recommendation:  Discharge Diet Orders (From admission, onward)     Start     Ordered   05/13/23 0000  Diet - low sodium heart healthy        05/13/23 0938           Cardiac diet DISCHARGE MEDICATION: Allergies as of 05/13/2023       Reactions   Sulfa  Antibiotics Other (See Comments)   Mouth sore and raw        Medication List     STOP taking these medications    Apixaban  Starter Pack (10mg  and 5mg ) Commonly known as: ELIQUIS  STARTER PACK       TAKE these medications    benzonatate  100 MG capsule Commonly known as: TESSALON  Take 2 capsules (200 mg total) by mouth 2 (two) times daily as needed for cough.   cefadroxil  500 MG capsule Commonly known as: DURICEF Take 2 capsules (1,000 mg total) by mouth 2 (two) times daily for 6 days.   doxycycline  100 MG tablet Commonly known as: VIBRA -TABS Take 1 tablet (100 mg total) by mouth every 12 (twelve) hours for 6 days.   enoxaparin  60 MG/0.6ML injection Commonly known as: LOVENOX  Inject 0.525 mLs (52 mg total) into the skin every 12 (twelve) hours.   fentaNYL  12 MCG/HR Commonly known as: DURAGESIC  Place 1 patch onto the skin every 3 (three) days.   fluconazole  10 MG/ML suspension Commonly known as: DIFLUCAN  TAKE 10 ML (100 MG TOTAL) BY MOUTH DAILY   lidocaine  5 % Commonly known as: LIDODERM  Place 1 patch onto the skin daily. Remove & Discard patch within 12 hours or as directed by MD   lidocaine -prilocaine  cream Commonly known as: EMLA  Apply to affected area once   metoprolol  succinate 25 MG 24 hr tablet Commonly  known as: TOPROL -XL Take 1 tablet (25 mg total) by mouth daily. Start taking on: May 14, 2023   omeprazole  2 mg/mL Susp oral suspension Commonly known as: KONVOMEP  Take 20 mLs (40 mg total) by mouth daily.   ondansetron  4 MG disintegrating tablet Commonly known as: ZOFRAN -ODT Take 1 tablet (4 mg total) by mouth every 8 (eight) hours as needed for nausea or vomiting.   oxyCODONE  5 MG/5ML solution Commonly known as: ROXICODONE  Take 5 mLs (5 mg total) by mouth every 4 (four) hours as needed for severe pain (pain score 7-10).   rosuvastatin  40 MG tablet Commonly known as: CRESTOR  TAKE 1 TABLET BY MOUTH DAILY   sucralfate  1 GM/10ML suspension Commonly known as: Carafate  Take 10 mLs (1 g total) by mouth 4 (four) times daily -  with meals and at bedtime.        Follow-up Information     [name], DO Follow up in 1 week(s).   Specialty: Family Medicine Contact information: [address] [place] [address] [phone]                Discharge Exam: Fredricka Weights   05/10/23 1226 05/13/23 0434  Weight: 52 kg 52.7 kg   General exam: Appears calm and comfortable  Respiratory system: Clear to auscultation. Respiratory effort normal. Cardiovascular system:  Irregular. No JVD, murmurs, rubs, gallops or clicks. No pedal edema. Gastrointestinal system: Abdomen is nondistended, soft and nontender. No organomegaly or masses felt. Normal bowel sounds heard. Central nervous system: Alert and oriented x3. No focal neurological deficits. Extremities: Leg edema has resolved. Skin: No rashes, lesions or ulcers Psychiatry: Judgement and insight appear normal. Mood & affect appropriate.    Condition at discharge: good  The results of significant diagnostics from this hospitalization (including imaging, microbiology, ancillary and laboratory) are listed below for reference.   Imaging Studies: PERIPHERAL VASCULAR CATHETERIZATION Result Date:  05/12/2023 See surgical note for result.  ECHOCARDIOGRAM COMPLETE Result Date: 05/12/2023    ECHOCARDIOGRAM REPORT   Patient Name:   BREALYN BARIL Date of Exam: 05/11/2023 Medical Rec #:  [id number]    Height:       61.0 in Accession #:    [id number]   Weight:       114.6 lb Date of Birth:  [birth year]    BSA:          1.491 m Patient Age:    67 years     BP:           115/67 mmHg Patient Gender: F            HR:           92 bpm. Exam Location:  [facility] Procedure: 2D Echo, Cardiac Doppler and Color Doppler Indications:     Pulmonary embolus  History:         Patient has prior history of Echocardiogram examinations, most                  recent 01/27/2023. CHF, CAD, COPD, Arrythmias:Atrial                  Fibrillation and Atrial Flutter; Risk Factors:Dyslipidemia.                  Breast CA, CKD.  Sonographer:     Naomie Reef Referring Phys:  [id number] XILIN NIU Diagnosing Phys: Evalene Lunger MD  Sonographer Comments: Technically difficult study due to poor echo windows. Image acquisition challenging due to respiratory motion. IMPRESSIONS  1. Left ventricular ejection fraction, by estimation, is 60 to 65%. The left ventricle has normal function. The left ventricle has no regional wall motion abnormalities. There is mild left ventricular hypertrophy. Left ventricular diastolic parameters are consistent with Grade II diastolic dysfunction (pseudonormalization).  2. Right ventricular systolic function is normal. The right ventricular size is mildly enlarged. There is normal pulmonary artery systolic pressure. The estimated right ventricular systolic pressure is 29.9 mmHg.  3. The mitral valve is normal in structure. Mild mitral valve regurgitation. No evidence of mitral stenosis.  4. Tricuspid valve regurgitation is mild to moderate.  5. The aortic valve is normal in structure. Aortic valve regurgitation is not visualized. No aortic stenosis is present.  6. The inferior vena cava is normal in size with greater than 50%  respiratory variability, suggesting right atrial pressure of 3 mmHg. FINDINGS  Left Ventricle: Left ventricular ejection fraction, by estimation, is 60 to 65%. The left ventricle has normal function. The left ventricle has no regional wall motion abnormalities. The left ventricular internal cavity size was normal in size. There is  mild left ventricular hypertrophy. Left ventricular diastolic parameters are consistent with Grade II diastolic dysfunction (pseudonormalization). Right Ventricle: The right ventricular size is mildly enlarged. No increase in right ventricular wall thickness. Right ventricular systolic function is normal.  There is normal pulmonary artery systolic pressure. The tricuspid regurgitant velocity is 2.34  m/s, and with an assumed right atrial pressure of 8 mmHg, the estimated right ventricular systolic pressure is 29.9 mmHg. Left Atrium: Left atrial size was normal in size. Right Atrium: Right atrial size was normal in size. Pericardium: There is no evidence of pericardial effusion. Mitral Valve: The mitral valve is normal in structure. Mild mitral valve regurgitation. No evidence of mitral valve stenosis. MV peak gradient, 3.3 mmHg. The mean mitral valve gradient is 1.0 mmHg. Tricuspid Valve: The tricuspid valve is normal in structure. Tricuspid valve regurgitation is mild to moderate. No evidence of tricuspid stenosis. Aortic Valve: The aortic valve is normal in structure. Aortic valve regurgitation is not visualized. No aortic stenosis is present. Aortic valve mean gradient measures 2.0 mmHg. Aortic valve peak gradient measures 3.9 mmHg. Aortic valve area, by VTI measures 2.52 cm. Pulmonic Valve: The pulmonic valve was normal in structure. Pulmonic valve regurgitation is not visualized. No evidence of pulmonic stenosis. Aorta: The aortic root is normal in size and structure. Venous: The inferior vena cava is normal in size with greater than 50% respiratory variability, suggesting right  atrial pressure of 3 mmHg. IAS/Shunts: No atrial level shunt detected by color flow Doppler.  LEFT VENTRICLE PLAX 2D LVIDd:         3.40 cm   Diastology LVIDs:         2.10 cm   LV e' medial:    6.64 cm/s LV PW:         1.00 cm   LV E/e' medial:  11.4 LV IVS:        1.20 cm   LV e' lateral:   7.51 cm/s LVOT diam:     1.80 cm   LV E/e' lateral: 10.1 LV SV:         50 LV SV Index:   33 LVOT Area:     2.54 cm  RIGHT VENTRICLE RV Basal diam:  3.50 cm RV Mid diam:    3.80 cm RV S prime:     12.90 cm/s TAPSE (M-mode): 1.9 cm LEFT ATRIUM             Index        RIGHT ATRIUM           Index LA diam:        3.00 cm 2.01 cm/m   RA Area:     14.00 cm LA Vol (A2C):   34.8 ml 23.34 ml/m  RA Volume:   34.70 ml  23.27 ml/m LA Vol (A4C):   29.2 ml 19.59 ml/m LA Biplane Vol: 31.9 ml 21.40 ml/m  AORTIC VALVE                    PULMONIC VALVE AV Area (Vmax):    2.25 cm     PV Vmax:       1.46 m/s AV Area (Vmean):   2.25 cm     PV Peak grad:  8.5 mmHg AV Area (VTI):     2.52 cm AV Vmax:           98.70 cm/s AV Vmean:          63.800 cm/s AV VTI:            0.197 m AV Peak Grad:      3.9 mmHg AV Mean Grad:      2.0 mmHg LVOT Vmax:  87.10 cm/s LVOT Vmean:        56.300 cm/s LVOT VTI:          0.195 m LVOT/AV VTI ratio: 0.99  AORTA Ao Root diam: 2.30 cm MITRAL VALVE               TRICUSPID VALVE MV Area (PHT): 4.86 cm    TR Peak grad:   21.9 mmHg MV Area VTI:   2.85 cm    TR Vmax:        234.00 cm/s MV Peak grad:  3.3 mmHg MV Mean grad:  1.0 mmHg    SHUNTS MV Vmax:       0.90 m/s    Systemic VTI:  0.20 m MV Vmean:      51.5 cm/s   Systemic Diam: 1.80 cm MV Decel Time: 156 msec MV E velocity: 75.90 cm/s MV A velocity: 76.50 cm/s MV E/A ratio:  0.99 Evalene Lunger MD Electronically signed by Evalene Lunger MD Signature Date/Time: 05/12/2023/8:36:33 AM    Final    CT Angio Chest PE W and/or Wo Contrast Result Date: 05/10/2023 CLINICAL DATA:  68 year old female with history of lung cancer status post radiation  chemotherapy presenting for concerns of increasing swelling in legs. EXAM: CT ANGIOGRAPHY CHEST WITH CONTRAST TECHNIQUE: Multidetector CT imaging of the chest was performed using the standard protocol during bolus administration of intravenous contrast. Multiplanar CT image reconstructions and MIPs were obtained to evaluate the vascular anatomy. RADIATION DOSE REDUCTION: This exam was performed according to the departmental dose-optimization program which includes automated exposure control, adjustment of the mA and/or kV according to patient size and/or use of iterative reconstruction technique. CONTRAST:  36mL OMNIPAQUE  IOHEXOL  350 MG/ML SOLN COMPARISON:  Same day chest radiograph and CT chest 01/23/2023 FINDINGS: Cardiovascular: Segmental and subsegmental pulmonary emboli in the right lower lobe pulmonary arteries. There is evidence of right heart strain with RV-LV ratio of 1.2 and mild flattening of the interventricular septum. No pericardial effusion. Crescentic filling defect in the lower thoracic descending aorta extending into the upper abdominal aorta terminating at the level of the celiac axis. This is not definitively seen on prior imaging and may represent a thrombosed dissection. The aorta in the area of thrombosed dissection measures 2.8 x 2.5 cm and the true lumen measures 2.1 cm. Aortic atherosclerotic calcification. Mediastinum/Nodes: Trachea and esophagus are unremarkable. No thoracic adenopathy Lungs/Pleura: Emphysema. Diffuse bronchial wall thickening. No focal consolidation, pleural effusion, or pneumothorax. Multiple pulmonary nodules are redemonstrated. The largest nodules in the left upper lobe on series 7/image 106 and measures 16 by 10 mm, previously 14 x 13 mm. The largest nodule in the left lower lobe measures 11 x 8 mm, previously 11 x 9 mm. Ground-glass nodules along the right minor fissure measure 10 and 5 mm, respectively Upper Abdomen: No acute abnormality. Musculoskeletal: No acute  fracture. Remote pathologic right ninth posterior rib fracture. Review of the MIP images confirms the above findings. IMPRESSION: 1. Segmental and subsegmental pulmonary emboli in the right lower lobe pulmonary arteries. Positive for acute PE with CT evidence of right heart strain (RV/LV Ratio = 1.2) consistent with at least submassive (intermediate risk) PE. The presence of right heart strain has been associated with an increased risk of morbidity and mortality. Please refer to the Code PE Focused order set in EPIC. 2. Crescentic filling defect in the lower thoracic descending aorta extending into the upper abdominal aorta is not definitively seen on prior imaging and may represent a thrombosed  dissection. 3. Similar appearance of multiple pulmonary nodules. Aortic Atherosclerosis (ICD10-I70.0) and Emphysema (ICD10-J43.9). Critical Value/emergent results were called by telephone at the time of interpretation on 05/10/2023 at 7:41pm to provider PA Franky Moody, who verbally acknowledged these results. Electronically Signed   By: Norman Gatlin M.D.   On: 05/10/2023 19:54   US  Venous Img Lower Bilateral Result Date: 05/10/2023 CLINICAL DATA:  Bilateral lower extremity edema EXAM: BILATERAL LOWER EXTREMITY VENOUS DOPPLER ULTRASOUND TECHNIQUE: Gray-scale sonography with graded compression, as well as color Doppler and duplex ultrasound were performed to evaluate the lower extremity deep venous systems from the level of the common femoral vein and including the common femoral, femoral, profunda femoral, popliteal and calf veins including the posterior tibial, peroneal and gastrocnemius veins when visible. The superficial great saphenous vein was also interrogated. Spectral Doppler was utilized to evaluate flow at rest and with distal augmentation maneuvers in the common femoral, femoral and popliteal veins. COMPARISON:  None Available. FINDINGS: RIGHT LOWER EXTREMITY Common Femoral Vein: No evidence of thrombus.  Normal compressibility, respiratory phasicity and response to augmentation. Saphenofemoral Junction: No evidence of thrombus. Normal compressibility and flow on color Doppler imaging. Profunda Femoral Vein: No evidence of thrombus. Normal compressibility and flow on color Doppler imaging. Femoral Vein: No evidence of thrombus. Normal compressibility, respiratory phasicity and response to augmentation. Popliteal Vein: No evidence of thrombus. Normal compressibility, respiratory phasicity and response to augmentation. Calf Veins: No evidence of thrombus. Normal compressibility and flow on color Doppler imaging. Superficial Great Saphenous Vein: No evidence of thrombus. Normal compressibility. Venous Reflux:  None. Other Findings:  None. LEFT LOWER EXTREMITY Common Femoral Vein: No evidence of thrombus. Normal compressibility, respiratory phasicity and response to augmentation. Saphenofemoral Junction: No evidence of thrombus. Normal compressibility and flow on color Doppler imaging. Profunda Femoral Vein: No evidence of thrombus. Normal compressibility and flow on color Doppler imaging. Femoral Vein: No evidence of thrombus. Normal compressibility, respiratory phasicity and response to augmentation. Popliteal Vein: No evidence of thrombus. Normal compressibility, respiratory phasicity and response to augmentation. Calf Veins: No evidence of thrombus. Normal compressibility and flow on color Doppler imaging. Superficial Great Saphenous Vein: No evidence of thrombus. Normal compressibility. Venous Reflux:  None. Other Findings:  None. IMPRESSION: No evidence of deep venous thrombosis in either lower extremity. Electronically Signed   By: Wilkie Lent M.D.   On: 05/10/2023 16:40   DG Chest 2 View Result Date: 05/10/2023 CLINICAL DATA:  Shortness of breath. EXAM: CHEST - 2 VIEW COMPARISON:  Chest radiograph from 02/17/2023. FINDINGS: There are several nodules overlying the bilateral lung fields, marked with  electronic arrow sign, increased in number since the prior study. Findings are concerning for metastatic disease. Further evaluation with nonemergent chest CT scan is recommended. Bilateral lung fields are otherwise clear. No acute consolidation or lung collapse. Bilateral costophrenic angles are clear. Normal cardio-mediastinal silhouette. No acute osseous abnormalities. The soft tissues are within normal limits. Right-sided CT Port-A-Cath is again seen with its tip overlying the midportion of superior vena cava. IMPRESSION: *There are several bilateral lung nodules, increased in number since the prior study, concerning for worsening metastatic disease. Further evaluation with nonemergent chest CT scan is recommended. Electronically Signed   By: Ree Molt M.D.   On: 05/10/2023 16:18   DG Tibia/Fibula Left Result Date: 05/06/2023 CLINICAL DATA:  Blunt trauma to the lower left leg, initial encounter EXAM: LEFT TIBIA AND FIBULA - 2 VIEW COMPARISON:  None Available. FINDINGS: Mild soft tissue swelling is noted. No  acute fracture or dislocation is seen. IMPRESSION: Swelling without acute bony abnormality. Electronically Signed   By: Oneil Devonshire M.D.   On: 05/06/2023 23:18   US  Venous Img Lower Unilateral Left Result Date: 05/04/2023 CLINICAL DATA:  Left lower extremity edema. EXAM: Left LOWER EXTREMITY VENOUS DOPPLER ULTRASOUND TECHNIQUE: Gray-scale sonography with graded compression, as well as color Doppler and duplex ultrasound were performed to evaluate the lower extremity deep venous systems from the level of the common femoral vein and including the common femoral, femoral, profunda femoral, popliteal and calf veins including the posterior tibial, peroneal and gastrocnemius veins when visible. The superficial great saphenous vein was also interrogated. Spectral Doppler was utilized to evaluate flow at rest and with distal augmentation maneuvers in the common femoral, femoral and popliteal veins.  COMPARISON:  None Available. FINDINGS: Contralateral Common Femoral Vein: Respiratory phasicity is normal and symmetric with the symptomatic side. No evidence of thrombus. Normal compressibility. Common Femoral Vein: No evidence of thrombus. Normal compressibility, respiratory phasicity and response to augmentation. Saphenofemoral Junction: No evidence of thrombus. Normal compressibility and flow on color Doppler imaging. Profunda Femoral Vein: No evidence of thrombus. Normal compressibility and flow on color Doppler imaging. Femoral Vein: No evidence of thrombus. Normal compressibility, respiratory phasicity and response to augmentation. Popliteal Vein: No evidence of thrombus. Normal compressibility, respiratory phasicity and response to augmentation. Calf Veins: Poor compression along the posterior tibial vein and poor flow on Doppler. Superficial Great Saphenous Vein: No evidence of thrombus. Normal compressibility. Venous Reflux:  None. Other Findings: Small fluid collection along the popliteal fossa measuring 2.2 x 1.3 x 0.7 cm. IMPRESSION: Small below-the-knee thrombus along the posterior tibial vein. No evidence of above the knee DVT. Small fluid collection in the popliteal fossa measuring 2.2 x 0.7 x 1.3 cm. Baker's cysts is possible but there is differential. Please correlate with any known history or additional evaluation as clinically appropriate Electronically Signed   By: Ranell Bring M.D.   On: 05/04/2023 12:07    Microbiology: Results for orders placed or performed during the hospital encounter of 05/06/23  Blood culture (routine x 2)     Status: None   Collection Time: 05/07/23  1:18 AM   Specimen: BLOOD LEFT ARM  Result Value Ref Range Status   Specimen Description BLOOD LEFT ARM  Final   Special Requests   Final    BOTTLES DRAWN AEROBIC AND ANAEROBIC Blood Culture results may not be optimal due to an inadequate volume of blood received in culture bottles   Culture   Final    NO GROWTH  5 DAYS Performed at [facility], [address], [place], [place] [address]    Report Status 05/12/2023 FINAL  Final  Blood culture (routine x 2)     Status: None   Collection Time: 05/07/23  1:18 AM   Specimen: BLOOD LEFT ARM  Result Value Ref Range Status   Specimen Description BLOOD LEFT ARM  Final   Special Requests   Final    BOTTLES DRAWN AEROBIC AND ANAEROBIC Blood Culture adequate volume   Culture   Final    NO GROWTH 5 DAYS Performed at [facility], [address] [place], [place] [address]    Report Status 05/12/2023 FINAL  Final   *Note: Due to a large number of results and/or encounters for the requested time period, some results have not been displayed. A complete set of results can be found in Results Review.    Labs: CBC: Recent  Labs  Lab 05/07/23 0118 05/07/23 0540 05/10/23 1228 05/11/23 0705 05/12/23 0148  WBC 2.7* 2.3* 3.2* 2.2* 2.5*  NEUTROABS 1.7 1.4* 2.8  --   --   HGB 9.9* 9.6* 10.9* 9.5* 9.3*  HCT 30.9* 29.1* 32.4* 29.1* 29.1*  MCV 101.0* 99.3 98.5 99.7 104.7*  PLT 204 205 268 262 239   Basic Metabolic Panel: Recent Labs  Lab 05/07/23 0118 05/07/23 0540 05/10/23 1228 05/11/23 0705 05/12/23 0838  NA 134* 137 135 139  --   K 3.2* 3.3* 3.9 2.8* 4.2  CL 97* 100 99 102  --   CO2 24 24 23 27   --   GLUCOSE 99 89 124* 87  --   BUN 9 7* 11 8  --   CREATININE 0.82 0.71 0.71 0.56  --   CALCIUM  8.6* 8.4* 9.4 8.4*  --   MG  --   --   --  2.2  --    Liver Function Tests: Recent Labs  Lab 05/07/23 0118 05/10/23 1228  AST 22 31  ALT 14 16  ALKPHOS 69 78  BILITOT 0.7 0.7  PROT 5.8* 6.5  ALBUMIN  2.9* 3.3*   CBG: Recent Labs  Lab 05/11/23 1137  GLUCAP 90    Discharge time spent: greater than 30 minutes.  Signed: Murvin Mana, MD [facility] 05/13/2023

## 2023-05-13 NOTE — TOC Transition Note (Signed)
 Transition of Care [facility]) - Discharge Note   Patient Details  Name: Denise Watts MRN: [id number] Date of Birth: 1955/08/11  Transition of Care [facility]) CM/SW Contact:  Heron KATHEE Edison, RN Phone Number: 05/13/2023, 11:17 AM   Clinical Narrative:  05/13/23: Spouse will be transporting patient home on discharge today per Unit RN.   Please contact TOC RN CM today at [phone] if further/additional needs arise prior to discharge. Thank you.   Bing Edison MSN RN CM  Care Management Department.  [place]  [facility] Direct Dial: [phone] (Weekends Only) [facility] Main Office Phone: [phone] [facility] Fax: [phone]      Final next level of care: Home/Self Care     Patient Goals and CMS Choice            Discharge Placement                       Discharge Plan and Services Additional resources added to the After Visit Summary for                  DME Arranged: N/A DME Agency: NA         HH Agency: NA        Social Drivers of Health (SDOH) Interventions SDOH Screenings   Food Insecurity: No Food Insecurity (05/12/2023)  Housing: Low Risk  (05/12/2023)  Transportation Needs: No Transportation Needs (05/12/2023)  Utilities: Not At Risk (05/12/2023)  Alcohol Screen: Low Risk  (12/05/2022)  Depression (PHQ2-9): High Risk (03/10/2023)  Financial Resource Strain: Low Risk  (12/02/2021)  Physical Activity: Insufficiently Active (12/05/2022)  Social Connections: Moderately Isolated (05/12/2023)  Stress: Stress Concern Present (12/05/2022)  Tobacco Use: Medium Risk (05/10/2023)  Health Literacy: Adequate Health Literacy (12/05/2022)     Readmission Risk Interventions     No data to display

## 2023-05-13 NOTE — TOC Transition Note (Signed)
 Transition of Care [facility]) - Discharge Note   Patient Details  Name: Denise Watts MRN: [id number] Date of Birth: 1955/06/03  Transition of Care [facility]) CM/SW Contact:  Heron KATHEE Edison, RN Phone Number: 05/13/2023, 10:02 AM   Clinical Narrative: 05/13/23: Patient has discharge to home/self care for today pending safe transportation due to weather/road conditions on this date. Follow up appointments to be made with PCP and ongoing oncology providers post discharge. Patient is currently on room air. No DME orders noted.    Please contact TOC RN CM today at [phone] if further/additional needs arise prior to discharge. Thank you.   Bing Edison MSN RN CM  Care Management Department.  [place]  [facility] Direct Dial: [phone] (Weekends Only) [facility] Main Office Phone: [phone] [facility] Fax: [phone]     Final next level of care: Home/Self Care     Patient Goals and CMS Choice            Discharge Placement                       Discharge Plan and Services Additional resources added to the After Visit Summary for                  DME Arranged: N/A DME Agency: NA         HH Agency: NA        Social Drivers of Health (SDOH) Interventions SDOH Screenings   Food Insecurity: No Food Insecurity (05/12/2023)  Housing: Low Risk  (05/12/2023)  Transportation Needs: No Transportation Needs (05/12/2023)  Utilities: Not At Risk (05/12/2023)  Alcohol Screen: Low Risk  (12/05/2022)  Depression (PHQ2-9): High Risk (03/10/2023)  Financial Resource Strain: Low Risk  (12/02/2021)  Physical Activity: Insufficiently Active (12/05/2022)  Social Connections: Moderately Isolated (05/12/2023)  Stress: Stress Concern Present (12/05/2022)  Tobacco Use: Medium Risk (05/10/2023)  Health Literacy: Adequate Health Literacy (12/05/2022)     Readmission Risk Interventions     No data to display

## 2023-05-13 NOTE — Progress Notes (Signed)
      Daily Progress Note   Assessment/Planning:   POD #1 s/p Suction thrombectomy B pulmonary artery for PE  Pt oxygenating well without supplemental oxygen    Subjective  - 1 Day Post-Op   No SOB or pleuritic pain   Objective   Vitals:   05/12/23 1849 05/13/23 0413 05/13/23 0434 05/13/23 0808  BP: 121/78 116/65  120/75  Pulse: (!) 105 (!) 102  93  Resp:    18  Temp: 97.9 F (36.6 C) 98.1 F (36.7 C)  98.2 F (36.8 C)  TempSrc:  Oral  Oral  SpO2: 100% 96%  97%  Weight:   52.7 kg   Height:         Intake/Output Summary (Last 24 hours) at 05/13/2023 1021 Last data filed at 05/12/2023 1612 Gross per 24 hour  Intake 144.35 ml  Output 250 ml  Net -105.65 ml    VASC R groin: no hematoma, no PSA, or thrill  PULM CTAB, sym exp, no nasal cannula oxygen     Laboratory   CBC    Latest Ref Rng & Units 05/13/2023    9:50 AM 05/12/2023    1:48 AM 05/11/2023    7:05 AM  CBC  WBC 4.0 - 10.5 K/uL 2.5  2.5  2.2   Hemoglobin 12.0 - 15.0 g/dL 9.5  9.3  9.5   Hematocrit 36.0 - 46.0 % 30.0  29.1  29.1   Platelets 150 - 400 K/uL 228  239  262     BMET    Component Value Date/Time   NA 139 05/11/2023 0705   NA 145 (H) 11/03/2021 1551   NA 143 04/08/2014 0409   K 4.2 05/12/2023 0838   K 3.5 04/08/2014 0409   CL 102 05/11/2023 0705   CL 113 (H) 04/08/2014 0409   CO2 27 05/11/2023 0705   CO2 26 04/08/2014 0409   GLUCOSE 87 05/11/2023 0705   GLUCOSE 84 04/08/2014 0409   BUN 8 05/11/2023 0705   BUN 13 11/03/2021 1551   BUN 6 (L) 04/08/2014 0409   CREATININE 0.56 05/11/2023 0705   CREATININE 0.68 05/01/2023 0924   CREATININE 0.67 04/08/2014 0409   CALCIUM  8.4 (L) 05/11/2023 0705   CALCIUM  7.5 (L) 04/08/2014 0409   GFRNONAA >60 05/11/2023 0705   GFRNONAA >60 05/01/2023 0924   GFRNONAA >60 04/08/2014 0409   GFRNONAA >60 11/26/2012 1019   GFRAA >60 01/20/2020 1314   GFRAA >60 04/08/2014 0409   GFRAA >60 11/26/2012 1019     Redell Door, MD, FACS, FSVS Covering for  [facility]: [phone]  05/13/2023, 10:21 AM

## 2023-05-14 ENCOUNTER — Ambulatory Visit: Payer: Medicare HMO

## 2023-05-15 ENCOUNTER — Ambulatory Visit: Payer: Medicare HMO | Admitting: Oncology

## 2023-05-15 ENCOUNTER — Encounter: Payer: Self-pay | Admitting: Oncology

## 2023-05-15 ENCOUNTER — Ambulatory Visit: Payer: Medicare HMO

## 2023-05-15 ENCOUNTER — Inpatient Hospital Stay (HOSPITAL_BASED_OUTPATIENT_CLINIC_OR_DEPARTMENT_OTHER): Payer: Medicare HMO | Admitting: Oncology

## 2023-05-15 ENCOUNTER — Other Ambulatory Visit: Payer: Self-pay

## 2023-05-15 ENCOUNTER — Ambulatory Visit
Admission: RE | Admit: 2023-05-15 | Discharge: 2023-05-15 | Disposition: A | Discharge status: Home / Self Care | Payer: Medicare HMO | Source: Ambulatory Visit | Attending: Radiation Oncology | Admitting: Radiation Oncology

## 2023-05-15 ENCOUNTER — Inpatient Hospital Stay: Payer: Medicare HMO

## 2023-05-15 ENCOUNTER — Other Ambulatory Visit: Payer: Medicare HMO

## 2023-05-15 ENCOUNTER — Telehealth: Payer: Self-pay

## 2023-05-15 VITALS — BP 122/39 | HR 101 | Temp 97.1°F | Resp 19 | Wt 110.5 lb

## 2023-05-15 VITALS — BP 112/52 | HR 95

## 2023-05-15 DIAGNOSIS — Z23 Encounter for immunization: Secondary | ICD-10-CM

## 2023-05-15 DIAGNOSIS — L270 Generalized skin eruption due to drugs and medicaments taken internally: Secondary | ICD-10-CM | POA: Diagnosis not present

## 2023-05-15 DIAGNOSIS — Z87891 Personal history of nicotine dependence: Secondary | ICD-10-CM | POA: Diagnosis not present

## 2023-05-15 DIAGNOSIS — C3492 Malignant neoplasm of unspecified part of left bronchus or lung: Secondary | ICD-10-CM | POA: Diagnosis not present

## 2023-05-15 DIAGNOSIS — B379 Candidiasis, unspecified: Secondary | ICD-10-CM | POA: Diagnosis not present

## 2023-05-15 DIAGNOSIS — Z5111 Encounter for antineoplastic chemotherapy: Secondary | ICD-10-CM | POA: Diagnosis not present

## 2023-05-15 DIAGNOSIS — I2699 Other pulmonary embolism without acute cor pulmonale: Secondary | ICD-10-CM

## 2023-05-15 DIAGNOSIS — Z510 Encounter for antineoplastic radiation therapy: Secondary | ICD-10-CM | POA: Diagnosis not present

## 2023-05-15 DIAGNOSIS — E43 Unspecified severe protein-calorie malnutrition: Secondary | ICD-10-CM | POA: Diagnosis not present

## 2023-05-15 DIAGNOSIS — C3412 Malignant neoplasm of upper lobe, left bronchus or lung: Secondary | ICD-10-CM | POA: Diagnosis not present

## 2023-05-15 DIAGNOSIS — Z853 Personal history of malignant neoplasm of breast: Secondary | ICD-10-CM | POA: Diagnosis not present

## 2023-05-15 DIAGNOSIS — E538 Deficiency of other specified B group vitamins: Secondary | ICD-10-CM

## 2023-05-15 DIAGNOSIS — C50511 Malignant neoplasm of lower-outer quadrant of right female breast: Secondary | ICD-10-CM | POA: Diagnosis not present

## 2023-05-15 DIAGNOSIS — R12 Heartburn: Secondary | ICD-10-CM | POA: Diagnosis not present

## 2023-05-15 DIAGNOSIS — K208 Other esophagitis without bleeding: Secondary | ICD-10-CM | POA: Diagnosis not present

## 2023-05-15 LAB — CBC WITH DIFFERENTIAL (CANCER CENTER ONLY)
Abs Immature Granulocytes: 0.03 10*3/uL (ref 0.00–0.07)
Basophils Absolute: 0 10*3/uL (ref 0.0–0.1)
Basophils Relative: 1 %
Eosinophils Absolute: 0.1 10*3/uL (ref 0.0–0.5)
Eosinophils Relative: 4 %
HCT: 31.4 % — ABNORMAL LOW (ref 36.0–46.0)
Hemoglobin: 10 g/dL — ABNORMAL LOW (ref 12.0–15.0)
Immature Granulocytes: 1 %
Lymphocytes Relative: 19 %
Lymphs Abs: 0.8 10*3/uL (ref 0.7–4.0)
MCH: 31.8 pg (ref 26.0–34.0)
MCHC: 31.8 g/dL (ref 30.0–36.0)
MCV: 100 fL (ref 80.0–100.0)
Monocytes Absolute: 0.6 10*3/uL (ref 0.1–1.0)
Monocytes Relative: 14 %
Neutro Abs: 2.4 10*3/uL (ref 1.7–7.7)
Neutrophils Relative %: 61 %
Platelet Count: 282 10*3/uL (ref 150–400)
RBC: 3.14 MIL/uL — ABNORMAL LOW (ref 3.87–5.11)
RDW: 16.4 % — ABNORMAL HIGH (ref 11.5–15.5)
WBC Count: 4 10*3/uL (ref 4.0–10.5)
nRBC: 0 % (ref 0.0–0.2)

## 2023-05-15 LAB — CMP (CANCER CENTER ONLY)
ALT: 14 U/L (ref 0–44)
AST: 33 U/L (ref 15–41)
Albumin: 3.1 g/dL — ABNORMAL LOW (ref 3.5–5.0)
Alkaline Phosphatase: 70 U/L (ref 38–126)
Anion gap: 10 (ref 5–15)
BUN: 11 mg/dL (ref 8–23)
CO2: 25 mmol/L (ref 22–32)
Calcium: 8.8 mg/dL — ABNORMAL LOW (ref 8.9–10.3)
Chloride: 101 mmol/L (ref 98–111)
Creatinine: 0.74 mg/dL (ref 0.44–1.00)
GFR, Estimated: >60 mL/min (ref >60)
Glucose, Bld: 120 mg/dL — ABNORMAL HIGH (ref 70–99)
Potassium: 3.5 mmol/L (ref 3.5–5.1)
Sodium: 136 mmol/L (ref 135–145)
Total Bilirubin: 0.6 mg/dL (ref 0.0–1.2)
Total Protein: 5.7 g/dL — ABNORMAL LOW (ref 6.5–8.1)

## 2023-05-15 LAB — RAD ONC ARIA SESSION SUMMARY
Course Elapsed Days: 54
Plan Fractions Treated to Date: 23
Plan Prescribed Dose Per Fraction: 2 Gy
Plan Total Fractions Prescribed: 33
Plan Total Prescribed Dose: 66 Gy
Reference Point Dosage Given to Date: 46 Gy
Reference Point Session Dosage Given: 2 Gy
Session Number: 23

## 2023-05-15 MED ORDER — HEPARIN SOD (PORK) LOCK FLUSH 100 UNIT/ML IV SOLN
500.0000 [IU] | Freq: Once | INTRAVENOUS | Status: AC | PRN
  Administered 2023-05-15: 500 [IU]
  Filled 2023-05-15: qty 5

## 2023-05-15 MED ORDER — SODIUM CHLORIDE 0.9% FLUSH
10.0000 mL | Freq: Once | INTRAVENOUS | Status: AC | PRN
  Administered 2023-05-15: 10 mL
  Filled 2023-05-15: qty 10

## 2023-05-15 MED ORDER — SODIUM CHLORIDE 0.9 % IV SOLN
INTRAVENOUS | Status: DC
Start: 2023-05-15 — End: 2023-05-15
  Filled 2023-05-15 (×2): qty 250

## 2023-05-15 NOTE — Consult Note (Signed)
 [facility] Liaison Note  05/15/2023  Denise Watts 28-Apr-1956 [id number]  Location: RN Hospital Liaison screened the patient remotely at [facility].  Insurance: [employer] HMO   Denise Watts is a 68 y.o. female who is a Primary Care Patient of Donzella, Lauraine SAILOR, DO The patient was screened for 7 and 30 day readmission hospitalization with noted high risk score for unplanned readmission risk with 2 IP/1 ED in 6 months.  The patient was assessed for potential Care Management service needs for post hospital transition for care coordination. Review of patient's electronic medical record reveals patient was admitted for Acute pulmonary embolism. Pt followed heavily by oncology team. Liaison will requested VBCI TOC to completed a post hospital prevention readmission follow up call.   Plan: [facility] Liaison will continue to follow progress and disposition to asess for post hospital community care coordination/management needs.  Referral request for community care coordination: Liaison will make a referral for VBCI for post hospital transition of care and prevention readmission follow up call.   VBCI Care Management/Population Health does not replace or interfere with any arrangements made by the Inpatient Transition of Care team.   For questions contact:   Denise Ku, RN, [facility] Liaison [place]   [facility], Population Health Office Hours MTWF  8:00 am-6:00 pm Direct Dial: [phone] mobile [phone] [Office toll free line] Office Hours are M-F 8:30 - 5 pm [email or web address]

## 2023-05-15 NOTE — Patient Instructions (Signed)
 Rehydration, Older Adult  Rehydration is the replacement of fluids, salts, and minerals in the body (electrolytes) that are lost during dehydration. Dehydration is when there is not enough water or other fluids in the body. This happens when you lose more fluids than you take in. People who are age 70 or older have a higher risk of dehydration than younger adults. This is because in older age, the body: Is less able to maintain the right amount of water. Does not respond to temperature changes as well. Does not get a sense of thirst as easily or quickly. Other causes include: Not drinking enough fluids. This can occur when you are ill, when you forget to drink, or when you are doing activities that require a lot of energy, especially in hot weather. Conditions that cause loss of water or other fluids. These include diarrhea, vomiting, sweating, or urinating a lot. Other illnesses, such as fever or infection. Certain medicines, such as those that remove excess fluid from the body (diuretics). Symptoms of mild or moderate dehydration may include thirst, dry lips and mouth, and dizziness. Symptoms of severe dehydration may include increased heart rate, confusion, fainting, and not urinating. In severe cases, you may need to get fluids through an IV at the hospital. For mild or moderate cases, you can usually rehydrate at home by drinking certain fluids as told by your health care provider. What are the risks? Rehydration is usually safe. Taking in too much fluid (overhydration) can be a problem but is rare. Overhydration can cause an imbalance of electrolytes in the body, kidney failure, fluid in the lungs, or a decrease in salt (sodium) levels in the body. Supplies needed: You will need an oral rehydration solution (ORS) if your health care provider tells you to use one. This is a drink to treat dehydration. It can be found in pharmacies and retail stores. How to rehydrate Fluids Follow  instructions from your health care provider about what to drink. The kind of fluid and the amount you should drink depend on your condition. In general, you should choose drinks that you prefer. If told by your health care provider, drink an ORS. Make an ORS by following instructions on the package. Start by drinking small amounts, about  cup (120 mL) every 5-10 minutes. Slowly increase how much you drink until you have taken in the amount recommended by your health care provider. Drink enough clear fluids to keep your urine pale yellow. If you were told to drink an ORS, finish it first, then start slowly drinking other clear fluids. Drink fluids such as: Water. This includes sparkling and flavored water. Drinking only water can lead to having too little sodium in your body (hyponatremia). Follow the advice of your health care provider. Water from ice chips you suck on. Fruit juice with water added to it(diluted). Sports drinks. Hot or cold herbal teas. Broth-based soups. Coffee. Milk or milk products. Food Follow instructions from your health care provider about what to eat while you rehydrate. Your health care provider may recommend that you slowly begin eating regular foods in small amounts. Eat foods that contain a healthy balance of electrolytes, such as bananas, oranges, potatoes, tomatoes, and spinach. Avoid foods that are greasy or contain a lot of sugar. In some cases, you may get nutrition through a feeding tube that is passed through your nose and into your stomach (nasogastric tube, or NG tube). This may be done if you have uncontrolled vomiting or diarrhea. Drinks to avoid  Certain drinks may make dehydration worse. While you rehydrate, avoid drinking alcohol. How to tell if you are recovering from dehydration You may be getting better if: You are urinating more often than before you started rehydrating. Your urine is pale yellow. Your energy level improves. You vomit less  often. You have diarrhea less often. Your appetite improves or returns to normal. You feel less dizzy or light-headed. Your skin tone and color start to look more normal. Follow these instructions at home: Take over-the-counter and prescription medicines only as told by your health care provider. Do not take sodium tablets. Doing this can lead to having too much sodium in your body (hypernatremia). Contact a health care provider if: You continue to have symptoms of mild or moderate dehydration, such as: Thirst. Dry lips. Slightly dry mouth. Dizziness. Dark urine or less urine than usual. Muscle cramps. You continue to vomit or have diarrhea. Get help right away if: You have symptoms of dehydration that get worse. You have a fever. You have a severe headache. You have been vomiting and have problems, such as: Your vomiting gets worse. Your vomit includes blood or green matter (bile). You cannot eat or drink without vomiting. You have problems with urination or bowel movements, such as: Diarrhea that gets worse. Blood in your stool (feces). This may cause stool to look black and tarry. Not urinating, or urinating only a small amount of very dark urine, within 6-8 hours. You have trouble breathing. You have symptoms that get worse with treatment. These symptoms may be an emergency. Get help right away. Call 911. Do not wait to see if the symptoms will go away. Do not drive yourself to the hospital. This information is not intended to replace advice given to you by your health care provider. Make sure you discuss any questions you have with your health care provider. Document Revised: 09/01/2021 Document Reviewed: 08/30/2021 Elsevier Patient Education  2024 [employer].

## 2023-05-15 NOTE — Patient Outreach (Signed)
 Care Management  Transitions of Care Program Transitions of Care Post-discharge Initial Outreach   05/15/2023 Name: Denise Watts MRN: [id number] DOB: July 25, 1955  Subjective: Denise Watts is a 68 y.o. year old female who is a primary care patient of [name], DO. The Care Management team Engaged with patient Engaged with patient by telephone to assess and address transitions of care needs.   Consent to Services:  Patient was given information about care management services, agreed to services, and gave verbal consent to participate.   Assessment:  Date of Discharge: 05/13/23 Discharge Facility: [facility]) Type of Discharge: Inpatient Admission Primary Inpatient Discharge Diagnosis:: Pulmonary embolus  SDOH Interventions    Flowsheet Row Telephone from 05/15/2023 in [place] POPULATION HEALTH DEPARTMENT Telephone from 05/09/2023 in [place] POPULATION HEALTH DEPARTMENT Clinical Support from 12/05/2022 in [facility] Care Coordination from 11/21/2022 in [facility] Community Care Coordination Office Visit from 10/12/2022 in [facility] Office Visit from 03/17/2022 in [facility]  SDOH Interventions        Food Insecurity Interventions Intervention Not Indicated Intervention Not Indicated Intervention Not Indicated Intervention Not Indicated -- --  Housing Interventions Intervention Not Indicated Intervention Not Indicated Intervention Not Indicated Intervention Not Indicated -- --  Transportation Interventions Intervention Not Indicated Intervention Not Indicated, Patient Resources (Friends/Family) Intervention Not Indicated Intervention Not Indicated -- --  Utilities Interventions Intervention Not Indicated Intervention Not Indicated Intervention Not Indicated -- -- --  Alcohol Usage Interventions -- -- Intervention Not Indicated (Score <7) -- -- --  Depression  Interventions/Treatment  -- -- Currently on Treatment Medication Currently on Treatment PHQ2-9 Score <4 Follow-up Not Indicated  Financial Strain Interventions -- -- Intervention Not Indicated -- -- --  Physical Activity Interventions -- -- Intervention Not Indicated -- -- --  Stress Interventions -- -- Intervention Not Indicated -- -- --  Social Connections Interventions Intervention Not Indicated -- Intervention Not Indicated -- -- --  Health Literacy Interventions -- -- Intervention Not Indicated -- -- --        Goals Addressed             This Visit's Progress    TOC Care Plan       Current Barriers:  Knowledge Deficits related to plan of care for management of Lung Cancer  No Advanced Directives in place  RNCM Clinical Goal(s):  Patient will work with the Care Management team over the next 30 days to address Transition of Care Barriers: Medication access Medication Management Diet/Nutrition/Food Resources Support at home Provider appointments Functional/Safety verbalize understanding of plan for management of lung Cancer as evidenced by Verbal feedback and no admissions to the hospital in the next 30 days take all medications exactly as prescribed and will call provider for medication related questions as evidenced by verbal feedback  through collaboration with RN Care manager, provider, and care team.   Interventions: Evaluation of current treatment plan related to  self management and patient's adherence to plan as established by provider   Oncology:  (Status: New goal.) Short Term Goal Assessment of understanding of oncology diagnosis:  Assessed patient understanding of cancer diagnosis and recommended treatment plan Reviewed upcoming provider appointments and treatment appointments Assessed support system. Has consistent/reliable family or other support: Yes Nutrition assessment performed  Patient Goals/Self-Care Activities: Participate in Transition of Care  Program/Attend [facility] scheduled calls Notify RN Care Manager of TOC call rescheduling needs Take all medications  as prescribed Attend all scheduled provider appointments Call pharmacy for medication refills 3-7 days in advance of running out of medications Call provider office for new concerns or questions   Follow Up Plan:  Telephone follow up appointment with care management team member scheduled for:  Wednesday 05/17/23 at 2:30pm         Outreach completed to the patient today. She was difficult to understand at times due to muffled voice and the pitch of her voice. She states she just returned home from this morning from XRT and Chemo and a provider visit and then she had to go to the pharmacy to pick up her medications. The patient has had some throat cancer in the past and it is difficult to swallow pills and tablets so some of medications are liquid and the pharmacy has trouble dispensing. She states she is shaking today. Encourage her to call the provider. RNCM sent a message to inform of current status and to inquire about her Fentanyl  12mcg patch that she does not have and does not have refills. The patient has not taken any of her medications and she was encouraged to take her meds including Lovenox  injection. The patient has daily XRT and weekly chemotherapy. She complains of incontinence and wears depends. Provider notified. The patient has been enrolled in the [facility] program   Please refer to Care Plan for goals and interventions.  Patient educated on red flags signs/symptoms to watch for and was encouraged to report any of these identified, any new symptoms, changes in baseline or medication regimen, change in health status / well-being, or safety concerns to PCP and / or the [facility]BCI Case Management team.   The patient has been provided with contact information for the care management team and has been advised to call with any health-related questions or concerns. The patient verbalized  understanding with current POC. The patient is directed to their insurance card regarding availability of benefits coverage.   Medford Balboa, RN Medical Illustrator VBCI-Population Health [phone]

## 2023-05-16 ENCOUNTER — Ambulatory Visit
Admission: RE | Admit: 2023-05-16 | Discharge: 2023-05-16 | Disposition: A | Discharge status: Home / Self Care | Payer: Medicare HMO | Source: Ambulatory Visit | Attending: Radiation Oncology | Admitting: Radiation Oncology

## 2023-05-16 ENCOUNTER — Ambulatory Visit: Payer: Medicare HMO

## 2023-05-16 ENCOUNTER — Other Ambulatory Visit: Payer: Self-pay

## 2023-05-16 DIAGNOSIS — Z853 Personal history of malignant neoplasm of breast: Secondary | ICD-10-CM | POA: Diagnosis not present

## 2023-05-16 DIAGNOSIS — R12 Heartburn: Secondary | ICD-10-CM | POA: Diagnosis not present

## 2023-05-16 DIAGNOSIS — B379 Candidiasis, unspecified: Secondary | ICD-10-CM | POA: Diagnosis not present

## 2023-05-16 DIAGNOSIS — Z510 Encounter for antineoplastic radiation therapy: Secondary | ICD-10-CM | POA: Diagnosis not present

## 2023-05-16 DIAGNOSIS — Z5111 Encounter for antineoplastic chemotherapy: Secondary | ICD-10-CM | POA: Diagnosis not present

## 2023-05-16 DIAGNOSIS — K208 Other esophagitis without bleeding: Secondary | ICD-10-CM | POA: Diagnosis not present

## 2023-05-16 DIAGNOSIS — C3412 Malignant neoplasm of upper lobe, left bronchus or lung: Secondary | ICD-10-CM | POA: Diagnosis not present

## 2023-05-16 DIAGNOSIS — L270 Generalized skin eruption due to drugs and medicaments taken internally: Secondary | ICD-10-CM | POA: Diagnosis not present

## 2023-05-16 DIAGNOSIS — C50511 Malignant neoplasm of lower-outer quadrant of right female breast: Secondary | ICD-10-CM | POA: Diagnosis not present

## 2023-05-16 DIAGNOSIS — Z87891 Personal history of nicotine dependence: Secondary | ICD-10-CM | POA: Diagnosis not present

## 2023-05-16 LAB — RAD ONC ARIA SESSION SUMMARY
Course Elapsed Days: 55
Plan Fractions Treated to Date: 24
Plan Prescribed Dose Per Fraction: 2 Gy
Plan Total Fractions Prescribed: 33
Plan Total Prescribed Dose: 66 Gy
Reference Point Dosage Given to Date: 48 Gy
Reference Point Session Dosage Given: 2 Gy
Session Number: 24

## 2023-05-17 ENCOUNTER — Ambulatory Visit: Payer: Medicare HMO

## 2023-05-17 ENCOUNTER — Inpatient Hospital Stay: Payer: Medicare HMO

## 2023-05-17 ENCOUNTER — Other Ambulatory Visit: Payer: Self-pay

## 2023-05-17 ENCOUNTER — Ambulatory Visit
Admission: RE | Admit: 2023-05-17 | Discharge: 2023-05-17 | Disposition: A | Discharge status: Home / Self Care | Payer: Medicare HMO | Source: Ambulatory Visit | Attending: Radiation Oncology | Admitting: Radiation Oncology

## 2023-05-17 DIAGNOSIS — B379 Candidiasis, unspecified: Secondary | ICD-10-CM | POA: Diagnosis not present

## 2023-05-17 DIAGNOSIS — L270 Generalized skin eruption due to drugs and medicaments taken internally: Secondary | ICD-10-CM | POA: Diagnosis not present

## 2023-05-17 DIAGNOSIS — C50511 Malignant neoplasm of lower-outer quadrant of right female breast: Secondary | ICD-10-CM | POA: Diagnosis not present

## 2023-05-17 DIAGNOSIS — R12 Heartburn: Secondary | ICD-10-CM | POA: Diagnosis not present

## 2023-05-17 DIAGNOSIS — K208 Other esophagitis without bleeding: Secondary | ICD-10-CM | POA: Diagnosis not present

## 2023-05-17 DIAGNOSIS — Z853 Personal history of malignant neoplasm of breast: Secondary | ICD-10-CM | POA: Diagnosis not present

## 2023-05-17 DIAGNOSIS — C3412 Malignant neoplasm of upper lobe, left bronchus or lung: Secondary | ICD-10-CM | POA: Diagnosis not present

## 2023-05-17 DIAGNOSIS — Z510 Encounter for antineoplastic radiation therapy: Secondary | ICD-10-CM | POA: Diagnosis not present

## 2023-05-17 DIAGNOSIS — Z87891 Personal history of nicotine dependence: Secondary | ICD-10-CM | POA: Diagnosis not present

## 2023-05-17 DIAGNOSIS — Z5111 Encounter for antineoplastic chemotherapy: Secondary | ICD-10-CM | POA: Diagnosis not present

## 2023-05-17 LAB — RAD ONC ARIA SESSION SUMMARY
Course Elapsed Days: 56
Plan Fractions Treated to Date: 25
Plan Prescribed Dose Per Fraction: 2 Gy
Plan Total Fractions Prescribed: 33
Plan Total Prescribed Dose: 66 Gy
Reference Point Dosage Given to Date: 50 Gy
Reference Point Session Dosage Given: 2 Gy
Session Number: 25

## 2023-05-17 NOTE — Patient Outreach (Signed)
  Care Management  Transitions of Care Program Transitions of Care Post-discharge Visit #2  05/17/2023 Name: Denise Watts MRN: [id number] DOB: [birth year]  Subjective: Denise Watts is a 68 y.o. year old female who is a primary care patient of Carlean Charter, DO. The Care Management team spoke with patient by telephone to assess and address transitions of care needs.   Plan: Additional outreach attempts will be made to reach the patient enrolled in the [facility] Program (Post Inpatient/ED Visit).  Gareld June, RN Medical illustrator VBCI-Population Health [phone]

## 2023-05-17 NOTE — Progress Notes (Signed)
 Nutrition Follow-up:  Patient with non small cell lung cancer.  Patient receiving radiation and carboplatin /paclitaxel . Hospitalization noted 1/8-1/11 due to PE.    Met with patient following radiation.  Reports that her appetite is not good.  Has not eaten anything today so far.  Yesterday ate some french toast with eggs.  Drinks chocolate Fairlife milk not the shakes.  Does not like ensure/boost.     Medications: reviewed  Labs: reviewed  Anthropometrics:   Weight 110 lb 8 oz on 1/13  115 lb 9.6 oz on 1/3 126 lb on 11/18 138 lb on 9/28   NUTRITION DIAGNOSIS: Inadequate oral intake ongoing   MALNUTRITION DIAGNOSIS: Severe malnutrition continues   INTERVENTION:  Discussed trying Fairlife shakes 2-3 times a day. Reviewed ways to add calories and protein in diet.  Consider trial of appetite stimulant if appetite does not improve    MONITORING, EVALUATION, GOAL: weight trends, intake   NEXT VISIT: Wednesday, Feb 5 phone call  Denise Watts, RD, LDN Registered Dietitian [phone]

## 2023-05-18 ENCOUNTER — Other Ambulatory Visit: Payer: Self-pay

## 2023-05-18 ENCOUNTER — Ambulatory Visit: Payer: Medicare HMO

## 2023-05-18 ENCOUNTER — Ambulatory Visit
Admission: RE | Admit: 2023-05-18 | Discharge: 2023-05-18 | Disposition: A | Discharge status: Home / Self Care | Payer: Medicare HMO | Source: Ambulatory Visit | Attending: Radiation Oncology | Admitting: Radiation Oncology

## 2023-05-18 ENCOUNTER — Inpatient Hospital Stay: Payer: Medicare HMO | Admitting: Family Medicine

## 2023-05-18 DIAGNOSIS — Z5111 Encounter for antineoplastic chemotherapy: Secondary | ICD-10-CM | POA: Diagnosis not present

## 2023-05-18 DIAGNOSIS — C50511 Malignant neoplasm of lower-outer quadrant of right female breast: Secondary | ICD-10-CM | POA: Diagnosis not present

## 2023-05-18 DIAGNOSIS — B379 Candidiasis, unspecified: Secondary | ICD-10-CM | POA: Diagnosis not present

## 2023-05-18 DIAGNOSIS — L270 Generalized skin eruption due to drugs and medicaments taken internally: Secondary | ICD-10-CM | POA: Diagnosis not present

## 2023-05-18 DIAGNOSIS — K208 Other esophagitis without bleeding: Secondary | ICD-10-CM | POA: Diagnosis not present

## 2023-05-18 DIAGNOSIS — C3412 Malignant neoplasm of upper lobe, left bronchus or lung: Secondary | ICD-10-CM | POA: Diagnosis not present

## 2023-05-18 DIAGNOSIS — R12 Heartburn: Secondary | ICD-10-CM | POA: Diagnosis not present

## 2023-05-18 DIAGNOSIS — Z87891 Personal history of nicotine dependence: Secondary | ICD-10-CM | POA: Diagnosis not present

## 2023-05-18 DIAGNOSIS — Z853 Personal history of malignant neoplasm of breast: Secondary | ICD-10-CM | POA: Diagnosis not present

## 2023-05-18 DIAGNOSIS — Z510 Encounter for antineoplastic radiation therapy: Secondary | ICD-10-CM | POA: Diagnosis not present

## 2023-05-18 LAB — RAD ONC ARIA SESSION SUMMARY
Course Elapsed Days: 57
Plan Fractions Treated to Date: 26
Plan Prescribed Dose Per Fraction: 2 Gy
Plan Total Fractions Prescribed: 33
Plan Total Prescribed Dose: 66 Gy
Reference Point Dosage Given to Date: 52 Gy
Reference Point Session Dosage Given: 2 Gy
Session Number: 26

## 2023-05-19 ENCOUNTER — Other Ambulatory Visit: Payer: Self-pay | Admitting: Hospice and Palliative Medicine

## 2023-05-19 ENCOUNTER — Ambulatory Visit: Payer: Medicare HMO

## 2023-05-19 ENCOUNTER — Ambulatory Visit
Admission: RE | Admit: 2023-05-19 | Discharge: 2023-05-19 | Disposition: A | Discharge status: Home / Self Care | Payer: Medicare HMO | Source: Ambulatory Visit | Attending: Radiation Oncology | Admitting: Radiation Oncology

## 2023-05-19 ENCOUNTER — Other Ambulatory Visit: Payer: Self-pay

## 2023-05-19 DIAGNOSIS — Z87891 Personal history of nicotine dependence: Secondary | ICD-10-CM | POA: Diagnosis not present

## 2023-05-19 DIAGNOSIS — B379 Candidiasis, unspecified: Secondary | ICD-10-CM | POA: Diagnosis not present

## 2023-05-19 DIAGNOSIS — Z510 Encounter for antineoplastic radiation therapy: Secondary | ICD-10-CM | POA: Diagnosis not present

## 2023-05-19 DIAGNOSIS — C3412 Malignant neoplasm of upper lobe, left bronchus or lung: Secondary | ICD-10-CM | POA: Diagnosis not present

## 2023-05-19 DIAGNOSIS — R12 Heartburn: Secondary | ICD-10-CM | POA: Diagnosis not present

## 2023-05-19 DIAGNOSIS — K208 Other esophagitis without bleeding: Secondary | ICD-10-CM | POA: Diagnosis not present

## 2023-05-19 DIAGNOSIS — L270 Generalized skin eruption due to drugs and medicaments taken internally: Secondary | ICD-10-CM | POA: Diagnosis not present

## 2023-05-19 DIAGNOSIS — Z5111 Encounter for antineoplastic chemotherapy: Secondary | ICD-10-CM | POA: Diagnosis not present

## 2023-05-19 DIAGNOSIS — Z853 Personal history of malignant neoplasm of breast: Secondary | ICD-10-CM | POA: Diagnosis not present

## 2023-05-19 DIAGNOSIS — C50511 Malignant neoplasm of lower-outer quadrant of right female breast: Secondary | ICD-10-CM | POA: Diagnosis not present

## 2023-05-19 LAB — RAD ONC ARIA SESSION SUMMARY
Course Elapsed Days: 58
Plan Fractions Treated to Date: 27
Plan Prescribed Dose Per Fraction: 2 Gy
Plan Total Fractions Prescribed: 33
Plan Total Prescribed Dose: 66 Gy
Reference Point Dosage Given to Date: 54 Gy
Reference Point Session Dosage Given: 2 Gy
Session Number: 27

## 2023-05-19 MED ORDER — OXYCODONE HCL 5 MG/5ML PO SOLN: 5.0000 mg | ORAL | 0 refills | Status: DC | PRN

## 2023-05-19 NOTE — Patient Outreach (Signed)
Care Management  Transitions of Care Program Transitions of Care Post-discharge Visit 2   05/19/2023 Name: Denise Watts MRN: [id number] DOB: 11-10-1955  Subjective: Denise Watts is a 68 y.o. year old female who is a primary care patient of Sherlyn Hay, DO. The Care Management team Engaged with patient Engaged with patient by telephone to assess and address transitions of care needs.   Consent to Services:  Patient was given information about care management services, agreed to services, and gave verbal consent to participate.   Assessment: Outreach to the patient today to check in after she was feeling poorly earlier in the week and did not have a Fentanyl patch. The patient today states she did not receive any information regarding the Duragesic patch. Ivin Booty Borders at the [facility] states he didn't order it and he would pass this on to her Oncologist Dr. Smith Robert. He did not respond. The patient states she is fearful that she will run out of her Oxycodone 5mg .ml before she sees Dr. Smith Robert Monday. Contacted the patient's pharmacy who will reach out to the provider but was not optimistic that she would get a call back. RNCM sent a secure chat today to [employer]. The patient has lost more weight and weighs today at 101lbs. She states she is not hungry but is trying to drink some protein shakes. She will continue next week with daily XRT and she has chemotherapy on 05/22/23. The patient complains of fatigue and back pain. RNCM to follow up next week. Ivin [employer]y [employer] as agreed to send in a script for the Oxycodone to the pharmacy. Patient made aware.    SDOH Interventions    Flowsheet Row Patient Outreach from 05/19/2023 in [place] [facility] Telephone from 05/15/2023 in [place] [facility] Telephone from 05/09/2023 in [place] [facility]PART[facility] Clinical Support from 12/05/2022 in [facility] Family Practice Care Coordination from  11/21/2022 in [facility] Community Care Coordination Office Visit from 10/12/2022 in [facility]  SDOH Interventions        Food Insecurity Interventions -- Intervention Not Indicated Intervention Not Indicated Intervention Not Indicated Intervention Not Indicated --  Housing Interventions -- Intervention Not Indicated Intervention Not Indicated Intervention Not Indicated Intervention Not Indicated --  Transportation Interventions -- Intervention Not Indicated Intervention Not Indicated, Patient Resources (Friends/Family) Intervention Not Indicated Intervention Not Indicated --  Utilities Interventions -- Intervention Not Indicated Intervention Not Indicated Intervention Not Indicated -- --  Alcohol Usage Interventions Intervention Not Indicated (Score <7) -- -- Intervention Not Indicated (Score <7) -- --  Depression Interventions/Treatment  -- -- -- Currently on Treatment Medication Currently on Treatment  Financial Strain Interventions Intervention Not Indicated -- -- Intervention Not Indicated -- --  Physical Activity Interventions Intervention Not Indicated -- -- Intervention Not Indicated -- --  Stress Interventions Intervention Not Indicated -- -- Intervention Not Indicated -- --  Social Connections Interventions -- Intervention Not Indicated -- Intervention Not Indicated -- --  Health Literacy Interventions Intervention Not Indicated -- -- Intervention Not Indicated -- --        Goals Addressed             This Visit's Progress    TOC Care Plan       Current Barriers:  Knowledge Deficits related to plan of care for management of Lung Cancer  No Advanced Directives in place  RNCM Clinical Goal(s): (reviewed 05/19/23) Patient will work with the Care Management  team over the next 30 days to address Transition of Care Barriers: Medication access Medication Management Diet/Nutrition/Food Resources Support at home Provider  appointments Functional/Safety verbalize understanding of plan for management of lung Cancer as evidenced by Verbal feedback and no admissions to the hospital in the next 30 days take all medications exactly as prescribed and will call provider for medication related questions as evidenced by verbal feedback  through collaboration with RN Care manager, provider, and care team.   Interventions: (Reviewed 05/19/23) Evaluation of current treatment plan related to  self management and patient's adherence to plan as established by provider   Oncology:  (Status: New goal.) Short Term Goal (reviewed 05/19/23) Assessment of understanding of oncology diagnosis:  Assessed patient understanding of cancer diagnosis and recommended treatment plan Reviewed upcoming provider appointments and treatment appointments Assessed support system. Has consistent/reliable family or other support: Yes Nutrition assessment performed  Patient Goals/Self-Care Activities: (reviewed 05/19/23) Participate in Transition of Care Program/Attend [facility] scheduled calls Notify RN Care Manager of [facility] call rescheduling needs Take all medications as prescribed Attend all scheduled provider appointments Call pharmacy for medication refills 3-7 days in advance of running out of medications Call provider office for new concerns or questions   Follow Up Plan:  Telephone follow up appointment with care management team member scheduled for:  Thursday 05/25/23 at 2:30pm         Routine follow-up and on-going assessment evaluation and education of disease processes, and recommended interventions for both chronic and acute medical conditions, will occur during each weekly visit during [facility] 30-day Program Outreach calls along with ongoing review of symptoms, medication reviews and reconciliation. Any updates, inconsistencies, discrepancies or acute care concerns will be addressed on the Care Plan and routed to the correct Practitioner if  indicated.    The patient has been provided with contact information for the care management team and has been advised to call with any health-related questions or concerns. The patient verbalized understanding with current POC. The patient is directed to their insurance card regarding availability of benefits coverage.  Deidre Ala, RN Medical illustrator [facility] [phone]

## 2023-05-19 NOTE — Patient Instructions (Signed)
Visit Information  Thank you for taking time to visit with me today. Please don't hesitate to contact me if I can be of assistance to you before our next scheduled telephone appointment.  Following is a copy of your care plan:   Goals Addressed             This Visit's Progress    TOC Care Plan       Current Barriers:  Knowledge Deficits related to plan of care for management of Lung Cancer  No Advanced Directives in place  RNCM Clinical Goal(s): (reviewed 05/19/23) Patient will work with the Care Management team over the next 30 days to address Transition of Care Barriers: Medication access Medication Management Diet/Nutrition/Food Resources Support at home Provider appointments Functional/Safety verbalize understanding of plan for management of lung Cancer as evidenced by Verbal feedback and no admissions to the hospital in the next 30 days take all medications exactly as prescribed and will call provider for medication related questions as evidenced by verbal feedback  through collaboration with RN Care manager, provider, and care team.   Interventions: (Reviewed 05/19/23) Evaluation of current treatment plan related to  self management and patient's adherence to plan as established by provider   Oncology:  (Status: New goal.) Short Term Goal (reviewed 05/19/23) Assessment of understanding of oncology diagnosis:  Assessed patient understanding of cancer diagnosis and recommended treatment plan Reviewed upcoming provider appointments and treatment appointments Assessed support system. Has consistent/reliable family or other support: Yes Nutrition assessment performed  Patient Goals/Self-Care Activities: (reviewed 05/19/23) Participate in Transition of Care Program/Attend [facility] scheduled calls Notify RN Care Manager of [facility] call rescheduling needs Take all medications as prescribed Attend all scheduled provider appointments Call pharmacy for medication refills 3-7 days in advance  of running out of medications Call provider office for new concerns or questions   Follow Up Plan:  Telephone follow up appointment with care management team member scheduled for:  Thursday 05/25/23 at 2:30pm          Patient verbalizes understanding of instructions and care plan provided today and agrees to view in MyChart. Active MyChart status and patient understanding of how to access instructions and care plan via MyChart confirmed with patient.     The patient has been provided with contact information for the care management team and has been advised to call with any health related questions or concerns.  Follow up with provider re: Dr. Smith Robert at the [facility]  Please call the care guide team at [phone] if you need to cancel or reschedule your appointment.   Please call the Suicide and Crisis Lifeline: 988 call the [place] National Suicide Prevention Lifeline: 641-090-7576 or TTY: 405-724-3753 TTY (938)300-4105) to talk to a trained counselor if you are experiencing a Mental Health or Behavioral Health Crisis or need someone to talk to.  Deidre Ala, RN Medical illustrator [facility] [phone]

## 2023-05-21 NOTE — Progress Notes (Signed)
 Hematology/Oncology Consult note [facility]  [phone] [phone]  Patient Care Team: [name], DO as PCP - General (Family Medicine) Darliss Rogue, MD as PCP - Cardiology (Cardiology) Melanee Annah BROCKS, MD as Consulting Physician (Oncology) Jordis Laneta FALCON, MD as Consulting Physician (General Surgery) Dasher, Alm LABOR, MD (Dermatology) Scheeler, Donnice PARAS, PA-C as Physician Assistant (Plastic Surgery) Dorothyann Drivers, MD as Attending Physician (Emergency Medicine) Ferrel, Lionel Sotero RIGGERS (Neurology) French Mering, MD as Referring Physician (Internal Medicine) Shellia Oh, MD (Inactive) as Consulting Physician (Pulmonary Disease) Sandria Selma LABOR, RPH (Inactive) (Pharmacist) Pa, [facility] (Optometry) Verdene Gills, RN as Oncology Nurse Navigator Lenn Aran, MD as Consulting Physician (Radiation Oncology) Moises Reusing, RN as [facility] Care Management   Name of the patient: Denise Watts  [id number]  Feb 18, 1956   Date of visit: 05/21/23  Diagnosis- non-small cell carcinoma of unknown primary   Chief complaint/ Reason for visit-on treatment assessment prior to cycle 6 of weekly CarboTaxol chemotherapy  Heme/Onc history:  Oncology History Overview Note  1. Carcinoma of breast, T1, N0, M0 tumor diagnosis.  In January of 1997 2. Carcinoma of the vocal cord T1, N0, M0 tumor.  Status postradiation therapy 3. Abnormal CT scan of the chest (December, 2015) 4. Repeat CT scan of chest shows stable nodule (July, 2016) 5. Neutropenia with normal hemoglobin and normal platelet count (July, 2016)   Cancer of vocal cord [facility]) (Resolved)  11/10/2014 Initial Diagnosis   Cancer of vocal cord   Malignant neoplasm of lower-outer quadrant of right breast of female, estrogen receptor negative (HCC)  11/24/2017 Initial Diagnosis   Malignant neoplasm of lower-outer quadrant of right breast of female, estrogen  receptor negative (HCC)   01/12/2018 Cancer Staging   Staging form: Breast, AJCC 8th Edition - Clinical stage from 01/12/2018: Stage IB (cT1a, cN0, cM0, G3, ER+, PR-, HER2-) - Signed by Melanee Annah BROCKS, MD on 01/12/2018   01/30/2018 - 04/26/2018 Chemotherapy   The patient had dexamethasone  (DECADRON ) 4 MG tablet, 8 mg, Oral, 2 times daily, 1 of 1 cycle, Start date: 01/12/2018, End date: 08/03/2018 palonosetron  (ALOXI ) injection 0.25 mg, 0.25 mg, Intravenous,  Once, 4 of 4 cycles Administration: 0.25 mg (01/30/2018), 0.25 mg (02/27/2018), 0.25 mg (03/28/2018), 0.25 mg (04/26/2018) pegfilgrastim  (NEULASTA  ONPRO KIT) injection 6 mg, 6 mg, Subcutaneous, Once, 4 of 4 cycles Administration: 6 mg (01/30/2018), 6 mg (02/27/2018), 6 mg (03/28/2018), 6 mg (04/26/2018) cyclophosphamide  (CYTOXAN ) 1,000 mg in sodium chloride  0.9 % 250 mL chemo infusion, 600 mg/m2 = 1,000 mg, Intravenous,  Once, 4 of 4 cycles Administration: 1,000 mg (01/30/2018), 1,000 mg (02/27/2018), 1,000 mg (03/28/2018), 1,000 mg (04/26/2018) DOCEtaxel  (TAXOTERE ) 130 mg in sodium chloride  0.9 % 250 mL chemo infusion, 75 mg/m2 = 130 mg, Intravenous,  Once, 4 of 4 cycles Dose modification: 60 mg/m2 (original dose 75 mg/m2, Cycle 2, Reason: Dose not tolerated) Administration: 130 mg (01/30/2018), 100 mg (02/27/2018), 100 mg (03/28/2018), 100 mg (04/26/2018)  for chemotherapy treatment.    Non-small cell lung cancer (HCC)  03/09/2023 Initial Diagnosis   Non-small cell lung cancer (HCC)   03/20/2023 -  Chemotherapy   Patient is on Treatment Plan : LUNG Carboplatin  + Paclitaxel  q7d       The patient was originally diagnosed with right breast cancer in 1997, 10 mm grade 3, ER pr positive her 2 negative, node negative status post surgery and radiation treatment only. She never received chemotherapy or hormone therapy. For her vocal cord cancer,  she had extensive surgery and radiation therapy in 1998. Due to past history of smoking, she was also  noted to have lung nodules. She is undergoing active surveillance imaging study of the chest.   Patient noted to have adrenal mass on CT chest which prompted PET CT in July 2018 which showed: IMPRESSION: 1. Right adrenal mass with peripheral hypermetabolism. Given interval development since 11/18/2015, suspicious for either isolated metastasis or a primary adrenal neoplasm. This should be considered for biopsy. 2. No evidence of hypermetabolic primary malignancy or extraadrenal metastasis. Pulmonary nodules are not significantly hypermetabolic. 3. Coronary artery atherosclerosis. Aortic Atherosclerosis (ICD10-I70.0). 4. Hepatic steatosis. 5. Left sixth rib hypermetabolism is favored to be related to remote Trauma.   Patient underwent right adrenalectomy which showed: DIAGNOSIS:  A. ADRENAL GLAND, RIGHT; ADRENALECTOMY:  - HIGH GRADE CARCINOMA WITH EXTENSIVE NECROSIS.  - MARGINS OF THE SPECIMEN ARE NEGATIVE FOR CARCINOMA.   Comment:  Sections demonstrate abundant necrosis confined to the adrenal medullary  compartment. In one block of tissue a small fragment of viable malignant  neoplasm was identified. A panel of immunohistochemical stains was  performed with the following pattern of immunoreactivity:  Super pancytokeratin: Positive  GATA-3: Negative  TTF-1: Negative  P40: Negative  Napsin: Negative  PAX-8: Negative  Chromogranin: Negative     Repeat CT chest abdomen and pelvis in July 2019 showed stable left lower lobe pulmonary nodule.  No other evidence of metastatic disease.  New 4.5 mm nodule in the right breast.  This was followed by ultrasound mammogram and core biopsy of the breast lesion which was a grade 3 triple negative breast cancer.  Patient was seen by Dr. Dessa and underwent lumpectomy on 12/08/2017.   Patient had a 5 mm weakly ER positive tumor.  Given her ongoing fatigue Oncotype testing was done to see if she could potentially avoid chemotherapy.  Oncotype shows a  recurrence score of 53 with her risk of distant recurrence at 9 years at greater than 39% with hormone therapy alone.  Absolute benefit of chemotherapy was greater than 15% in her age group.  Patient completed 4 cycles of adjuvant TC chemotherapy in December 2019    Patient has known bilateral pulmonary nodules which have been monitored and she was again found to have a left lower lobe lung nodule measuring 13 mm unchanged as compared to prior exams.  There were also new subcentimeter lung nodules detected and follow-up CT was recommended.  CT angio chest in August 2024 showed increase in the size of left upper lobe lung nodule and new multiple pulmonary nodules concerning for metastatic disease.  This was followed by a PET CT scan which showed hypermetabolic intrathoracic nodal adenopathy measuring 5 to 13 mm in size along with lung nodules with a maximum size measuring 13 mm with an SUV of 9.2.   CT-guided biopsy was nondiagnostic she is patient underwent bronchoscopy but lymph nodes were not sampled at that time due to equipment malfunction.Bronchial lavage from left upper lobe was positive for malignancy non-small cell carcinoma.  But no further subclassification whether this is primary lung cancer or metastatic carcinoma could be done due to limited specimen. EBUS station 7 LN also shows non small cell carcinoma      Interval history-patient was recently discharged from the hospital following an episode of pulmonary embolism.  She continues to report difficulty swallowing and therefore was discharged on Lovenox  instead of Eliquis .  She has not picked up her Eliquis  prescription yet and has gone  on for 2 days without anticoagulation.  ECOG PS- 3 Pain scale- 4   Review of systems- Review of Systems  Constitutional:  Positive for malaise/fatigue. Negative for chills, fever and weight loss.  HENT:  Negative for congestion, ear discharge and nosebleeds.   Eyes:  Negative for blurred vision.   Respiratory:  Negative for cough, hemoptysis, sputum production, shortness of breath and wheezing.   Cardiovascular:  Negative for chest pain, palpitations, orthopnea and claudication.  Gastrointestinal:  Negative for abdominal pain, blood in stool, constipation, diarrhea, heartburn, melena, nausea and vomiting.       Dysphagia  Genitourinary:  Negative for dysuria, flank pain, frequency, hematuria and urgency.  Musculoskeletal:  Negative for back pain, joint pain and myalgias.  Skin:  Negative for rash.  Neurological:  Negative for dizziness, tingling, focal weakness, seizures, weakness and headaches.  Endo/Heme/Allergies:  Does not bruise/bleed easily.  Psychiatric/Behavioral:  Negative for depression and suicidal ideas. The patient does not have insomnia.       Allergies  Allergen Reactions   Sulfa  Antibiotics Other (See Comments)    Mouth sore and raw     Past Medical History:  Diagnosis Date   Adenomatous colon polyp    Adrenal carcinoma, right (HCC) 12/28/2016   a.) high grade carcinoma with extensive necrosis --> s/p adrenalectomy   Anxiety    Aortic atherosclerosis (HCC)    Arthritis of right knee    Atrial fibrillation and flutter (HCC)    a.) CHA2DS2-VASc = 3 (age, sex, vascular disease history) as of 02/13/2023; b.) cardiac rate/rhythm maintained intrinsically without pharmacological intervention; no chronic OAC (does take low dose ASA)   B12 deficiency    Breast cancer (HCC) 1997   a.) stage IB (cT1a, cN0, cM0, G3, ER+, PR-, HER2-); s/p lumpectomy + XRT   COPD (chronic obstructive pulmonary disease) (HCC)    Coronary artery disease    a.) cCTA 12/09/2021: Ca2+ = 135 (80th %'ile for age/sex/race match contol) --> 25-49% pLAD and RCA distributions   Depression    Diastolic dysfunction    a.) TTE 12/11/2020, no RWMAs, norm RVSF, G1DD; b.) TTE 12/14/2021: EF 55-60, no RWMAs, norm RVSF, G1DD; c.) TTE 01/27/2023: EF 50-55%, no RWMAs, G1DD, mild RAE, mild MR, mod TR    Dry cough 03/02/2017   Dyspnea    Full dentures    GERD (gastroesophageal reflux disease)    History of hiatal hernia    HLD (hyperlipidemia)    Invasive carcinoma of breast (HCC) 11/15/2017   a.) path (+) for G3, ER -, PR -, HER2/neu - ; s/p lumpectomy 01/17/2018 + 4 cycles TC systemic chemotherapy + XRT   Long term current use of aspirin     Personal history of chemotherapy    Personal history of radiation therapy    Pneumothorax of left lung after biopsy 01/25/2023   a.) hypoxic in PACU requiring re-intubation --> transferred to ICU --> chest tube placed by PCCM   RLS (restless legs syndrome)    a.) on ropinirole    Skin cancer    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   a.) Stage IB (cT1a, cN0, cM0, G3, ER+, PR-, HER2-)     Past Surgical History:  Procedure Laterality Date   BIOPSY  01/05/2023   Procedure: BIOPSY;  Surgeon: Unk Corinn Skiff, MD;  Location: [facility];  Service: Gastroenterology;;   BREAST BIOPSY Right 1997   positive   BREAST BIOPSY Right 11/15/2017   INVASIVE MAMMARY CARCINOMA triple negative  BREAST BIOPSY Right 07/20/2020   us  bx, Q marker, neg   BREAST LUMPECTOMY Right 1997   2019 also   BREAST LUMPECTOMY WITH SENTINEL LYMPH NODE BIOPSY Right 12/08/2017   Procedure: BREAST LUMPECTOMY WITH SENTINEL LYMPH NODE BX;  Surgeon: Dessa Reyes ORN, MD;  Location: [facility];  Service: General;  Laterality: Right;   BREAST REDUCTION WITH MASTOPEXY Left 06/10/2019   Procedure: Left breast mastopexy reduction for symmetry;  Surgeon: Lowery Estefana RAMAN, DO;  Location: [facility];  Service: Plastics;  Laterality: Left;  total 2.5 hours   CATARACT EXTRACTION W/PHACO Right 02/07/2022   Procedure: CATARACT EXTRACTION PHACO AND INTRAOCULAR LENS PLACEMENT (IOC) RIGHT DIABETIC;  Surgeon: Myrna Adine Anes, MD;  Location: [facility];  Service: Ophthalmology;  Laterality: Right;  3.60 00:34.7   CATARACT EXTRACTION W/PHACO Left  02/28/2022   Procedure: CATARACT EXTRACTION PHACO AND INTRAOCULAR LENS PLACEMENT (IOC) LEFT DIABETIC 4.11 00:26.1;  Surgeon: Myrna Adine Anes, MD;  Location: [facility];  Service: Ophthalmology;  Laterality: Left;   COLONOSCOPY  2015   COLONOSCOPY N/A 01/05/2023   Procedure: COLONOSCOPY;  Surgeon: Unk Corinn Skiff, MD;  Location[facility];  Service: Gastroenterology;  Laterality: N/A;   COLONOSCOPY WITH PROPOFOL  N/A 10/29/2019   Procedure: COLONOSCOPY WITH PROPOFOL ;  Surgeon: Jinny Carmine, MD;  Location: [facility];  Service: Endoscopy;  Laterality: N/A;   ESOPHAGOGASTRODUODENOSCOPY (EGD) WITH PROPOFOL  N/A 05/24/2019   Procedure: ESOPHAGOGASTRODUODENOSCOPY (EGD) WITH PROPOFOL ;  Surgeon: Jinny Carmine, MD;  Location: [facility];  Service: Endoscopy;  Laterality: N/A;   ESOPHAGOGASTRODUODENOSCOPY (EGD) WITH PROPOFOL  N/A 09/06/2022   Procedure: ESOPHAGOGASTRODUODENOSCOPY (EGD) WITH PROPOFOL ;  Surgeon: Jinny Carmine, MD;  Location: [facility];  Service: Endoscopy;  Laterality: N/A;   ESOPHAGOGASTRODUODENOSCOPY (EGD) WITH PROPOFOL   01/05/2023   Procedure: ESOPHAGOGASTRODUODENOSCOPY (EGD) WITH PROPOFOL ;  Surgeon: Unk Corinn Skiff, MD;  Location: [facility];  Service: Gastroenterology;;   ESOPHAGUS SURGERY     IR IMAGING GUIDED PORT INSERTION  02/07/2023   KNEE SURGERY Right    LIPOSUCTION WITH LIPOFILLING Right 06/10/2019   Procedure: right breast scar and capsule release with fat grafting;  Surgeon: Lowery Estefana RAMAN, DO;  Location: [facility];  Service: Plastics;  Laterality: Right;   POLYPECTOMY  01/05/2023   Procedure: POLYPECTOMY;  Surgeon: Unk Corinn Skiff, MD;  Location: [facility];  Service: Gastroenterology;;   [facility] REMOVAL     PORTACATH PLACEMENT Right 01/17/2018   Procedure: INSERTION PORT-A-CATH- RIGHT;  Surgeon: Dessa Reyes ORN, MD;  Location: [facility];  Service: General;  Laterality: Right;   PULMONARY THROMBECTOMY  Bilateral 05/12/2023   Procedure: PULMONARY THROMBECTOMY;  Surgeon: Marea Selinda RAMAN, MD;  Location: [facility];  Service: Cardiovascular;  Laterality: Bilateral;   ROBOTIC ADRENALECTOMY Right 12/28/2016   Procedure: ROBOTIC ADRENALECTOMY;  Surgeon: Penne Knee, MD;  Location: [facility];  Service: Urology;  Laterality: Right;   SKIN CANCER EXCISION     THROAT SURGERY  1998   throat cancer    TUBAL LIGATION     VENTRAL HERNIA REPAIR N/A 03/03/2017   Procedure: HERNIA REPAIR VENTRAL ADULT;  Surgeon: Shelva Dunnings, MD;  Location: [facility];  Service: General;  Laterality: N/A;   VIDEO BRONCHOSCOPY WITH ENDOBRONCHIAL ULTRASOUND N/A 01/25/2023   Procedure: VIDEO BRONCHOSCOPY WITH ENDOBRONCHIAL ULTRASOUND;  Surgeon: Parris Manna, MD;  Location: [facility];  Service: Thoracic;  Laterality: N/A;   VIDEO BRONCHOSCOPY WITH ENDOBRONCHIAL ULTRASOUND N/A 02/17/2023   Procedure: VIDEO BRONCHOSCOPY WITH ENDOBRONCHIAL ULTRASOUND;  Surgeon: Parris Manna, MD;  Location: [facility];  Service: Thoracic;  Laterality: N/A;    Social History   Socioeconomic History   Marital status: Married    Spouse name: Blessed, Girdner (Spouse) [phone] ([place])   Number of children: 4   Years of education: Not on file   Highest education level: 10th grade  Occupational History   Occupation: retired  Tobacco Use   Smoking status: Former    Current packs/day: 0.00    Average packs/day: 1.5 packs/day for 30.0 years (45.0 ttl pk-yrs)    Types: Cigarettes    Start date: 05/02/1965    Quit date: 05/03/1995    Years since quitting: 28.0    Passive exposure: Past   Smokeless tobacco: Never  Vaping Use   Vaping status: Never Used  Substance and Sexual Activity   Alcohol use: Not Currently    Alcohol/week: 4.0 standard drinks of alcohol    Types: 4 Shots of liquor per week    Comment: none   Drug use: No   Sexual activity: Not Currently    Birth control/protection: Post-menopausal  Other Topics Concern   Not  on file  Social History Narrative   Not on file   Social Drivers of Health   Financial Resource Strain: Low Risk  (05/19/2023)   Overall Financial Resource Strain (CARDIA)    Difficulty of Paying Living Expenses: Not very hard  Food Insecurity: No Food Insecurity (05/15/2023)   Hunger Vital Sign    Worried About Running Out of Food in the Last Year: Never true    Ran Out of Food in the Last Year: Never true  Transportation Needs: No Transportation Needs (05/15/2023)   PRAPARE - Administrator, Civil Service (Medical): No    Lack of Transportation (Non-Medical): No  Physical Activity: Inactive (05/19/2023)   Exercise Vital Sign    Days of Exercise per Week: 0 days    Minutes of Exercise per Session: 0 min  Stress: Stress Concern Present (05/19/2023)   [employer] of Occupational Health - Occupational Stress Questionnaire    Feeling of Stress : To some extent  Social Connections: Moderately Isolated (05/15/2023)   Social Connection and Isolation Panel [NHANES]    Frequency of Communication with Friends and Family: More than three times a week    Frequency of Social Gatherings with Friends and Family: More than three times a week    Attends Religious Services: Never    Database Administrator or Organizations: No    Attends Banker Meetings: Never    Marital Status: Married  Catering Manager Violence: Not At Risk (05/15/2023)   Humiliation, Afraid, Rape, and Kick questionnaire    Fear of Current or Ex-Partner: No    Emotionally Abused: No    Physically Abused: No    Sexually Abused: No    Family History  Problem Relation Age of Onset   Diabetes Mother    Heart attack Mother    Cancer Sister        cancer of eyes, spread to lung, other places. died at 34   Cancer Sister 18       unknown primary, spread to bone, brain died in 22's   Cancer Paternal Aunt        unk type   Cancer Paternal Grandmother        type unk   Prostate cancer Neg Hx     Kidney cancer Neg Hx    Bladder Cancer Neg Hx    Breast cancer  Neg Hx      Current Outpatient Medications:    rOPINIRole  (REQUIP ) 0.25 MG tablet, Take by mouth., Disp: , Rfl:    benzonatate  (TESSALON ) 100 MG capsule, Take 2 capsules (200 mg total) by mouth 2 (two) times daily as needed for cough., Disp: 180 capsule, Rfl: 0   enoxaparin  (LOVENOX ) 60 MG/0.6ML injection, Inject 0.525 mLs (52 mg total) into the skin every 12 (twelve) hours., Disp: 32 mL, Rfl: 0   fentaNYL  (DURAGESIC ) 12 MCG/HR, Place 1 patch onto the skin every 3 (three) days., Disp: 5 patch, Rfl: 0   fluconazole  (DIFLUCAN ) 10 MG/ML suspension, TAKE 10 ML (100 MG TOTAL) BY MOUTH DAILY, Disp: 175 mL, Rfl: 0   lidocaine  (LIDODERM ) 5 %, Place 1 patch onto the skin daily. Remove & Discard patch within 12 hours or as directed by MD, Disp: , Rfl:    lidocaine -prilocaine  (EMLA ) cream, Apply to affected area once, Disp: 30 g, Rfl: 3   metoprolol  succinate (TOPROL -XL) 25 MG 24 hr tablet, Take 1 tablet (25 mg total) by mouth daily., Disp: 30 tablet, Rfl: 0   omeprazole  (KONVOMEP ) 2 mg/mL SUSP oral suspension, Take 20 mLs (40 mg total) by mouth daily., Disp: 240 mL, Rfl: 1   ondansetron  (ZOFRAN -ODT) 4 MG disintegrating tablet, Take 1 tablet (4 mg total) by mouth every 8 (eight) hours as needed for nausea or vomiting., Disp: 20 tablet, Rfl: 0   oxyCODONE  (ROXICODONE ) 5 MG/5ML solution, Take 5 mLs (5 mg total) by mouth every 4 (four) hours as needed for severe pain (pain score 7-10)., Disp: 200 mL, Rfl: 0   rosuvastatin  (CRESTOR ) 40 MG tablet, TAKE 1 TABLET BY MOUTH DAILY, Disp: 90 tablet, Rfl: 1   sucralfate  (CARAFATE ) 1 GM/10ML suspension, Take 10 mLs (1 g total) by mouth 4 (four) times daily -  with meals and at bedtime., Disp: 420 mL, Rfl: 0 No current facility-administered medications for this visit.  Facility-Administered Medications Ordered in Other Visits:    cyanocobalamin  ((VITAMIN B-12)) injection 1,000 mcg, 1,000 mcg,  Intramuscular, Q30 days, Melanee Annah BROCKS, MD, 1,000 mcg at 08/27/21 1537   cyanocobalamin  (VITAMIN B12) injection 1,000 mcg, 1,000 mcg, Intramuscular, Q30 days, Melanee Annah BROCKS, MD, 1,000 mcg at 03/16/22 1101  Physical exam:  Vitals:   05/15/23 1010  BP: (!) 122/39  Pulse: (!) 101  Resp: 19  Temp: (!) 97.1 F (36.2 C)  TempSrc: Tympanic  SpO2: 99%  Weight: 110 lb 8 oz (50.1 kg)   Physical Exam Constitutional:      Comments: Appears frail and fatigued  Cardiovascular:     Rate and Rhythm: Normal rate and regular rhythm.     Heart sounds: Normal heart sounds.  Pulmonary:     Effort: Pulmonary effort is normal.     Breath sounds: Normal breath sounds.  Skin:    General: Skin is warm and dry.  Neurological:     Mental Status: She is alert and oriented to person, place, and time.         Latest Ref Rng & Units 05/15/2023    9:49 AM  CMP  Glucose 70 - 99 mg/dL 879   BUN 8 - 23 mg/dL 11   Creatinine 9.55 - 1.00 mg/dL 9.25   Sodium 864 - 854 mmol/L 136   Potassium 3.5 - 5.1 mmol/L 3.5   Chloride 98 - 111 mmol/L 101   CO2 22 - 32 mmol/L 25   Calcium  8.9 - 10.3 mg/dL 8.8   Total Protein 6.5 -  8.1 g/dL 5.7   Total Bilirubin 0.0 - 1.2 mg/dL 0.6   Alkaline Phos 38 - 126 U/L 70   AST 15 - 41 U/L 33   ALT 0 - 44 U/L 14       Latest Ref Rng & Units 05/15/2023    9:49 AM  CBC  WBC 4.0 - 10.5 K/uL 4.0   Hemoglobin 12.0 - 15.0 g/dL 89.9   Hematocrit 63.9 - 46.0 % 31.4   Platelets 150 - 400 K/uL 282     No images are attached to the encounter.  PERIPHERAL VASCULAR CATHETERIZATION Result Date: 05/12/2023 See surgical note for result.  ECHOCARDIOGRAM COMPLETE Result Date: 05/12/2023    ECHOCARDIOGRAM REPORT   Patient Name:   ANGELI DEMILIO Date of Exam: 05/11/2023 Medical Rec #:  [id number]    Height:       61.0 in Accession #:    [id number]   Weight:       114.6 lb Date of Birth:  05/10/1955    BSA:          1.491 m Patient Age:    67 years     BP:           115/67 mmHg Patient  Gender: F            HR:           92 bpm. Exam Location:  [facility] Procedure: 2D Echo, Cardiac Doppler and Color Doppler Indications:     Pulmonary embolus  History:         Patient has prior history of Echocardiogram examinations, most                  recent 01/27/2023. CHF, CAD, COPD, Arrythmias:Atrial                  Fibrillation and Atrial Flutter; Risk Factors:Dyslipidemia.                  Breast CA, CKD.  Sonographer:     Naomie Reef Referring Phys:  [id number] XILIN NIU Diagnosing Phys: Evalene Lunger MD  Sonographer Comments: Technically difficult study due to poor echo windows. Image acquisition challenging due to respiratory motion. IMPRESSIONS  1. Left ventricular ejection fraction, by estimation, is 60 to 65%. The left ventricle has normal function. The left ventricle has no regional wall motion abnormalities. There is mild left ventricular hypertrophy. Left ventricular diastolic parameters are consistent with Grade II diastolic dysfunction (pseudonormalization).  2. Right ventricular systolic function is normal. The right ventricular size is mildly enlarged. There is normal pulmonary artery systolic pressure. The estimated right ventricular systolic pressure is 29.9 mmHg.  3. The mitral valve is normal in structure. Mild mitral valve regurgitation. No evidence of mitral stenosis.  4. Tricuspid valve regurgitation is mild to moderate.  5. The aortic valve is normal in structure. Aortic valve regurgitation is not visualized. No aortic stenosis is present.  6. The inferior vena cava is normal in size with greater than 50% respiratory variability, suggesting right atrial pressure of 3 mmHg. FINDINGS  Left Ventricle: Left ventricular ejection fraction, by estimation, is 60 to 65%. The left ventricle has normal function. The left ventricle has no regional wall motion abnormalities. The left ventricular internal cavity size was normal in size. There is  mild left ventricular hypertrophy. Left ventricular  diastolic parameters are consistent with Grade II diastolic dysfunction (pseudonormalization). Right Ventricle: The right ventricular size is mildly enlarged. No increase in right ventricular  wall thickness. Right ventricular systolic function is normal. There is normal pulmonary artery systolic pressure. The tricuspid regurgitant velocity is 2.34  m/s, and with an assumed right atrial pressure of 8 mmHg, the estimated right ventricular systolic pressure is 29.9 mmHg. Left Atrium: Left atrial size was normal in size. Right Atrium: Right atrial size was normal in size. Pericardium: There is no evidence of pericardial effusion. Mitral Valve: The mitral valve is normal in structure. Mild mitral valve regurgitation. No evidence of mitral valve stenosis. MV peak gradient, 3.3 mmHg. The mean mitral valve gradient is 1.0 mmHg. Tricuspid Valve: The tricuspid valve is normal in structure. Tricuspid valve regurgitation is mild to moderate. No evidence of tricuspid stenosis. Aortic Valve: The aortic valve is normal in structure. Aortic valve regurgitation is not visualized. No aortic stenosis is present. Aortic valve mean gradient measures 2.0 mmHg. Aortic valve peak gradient measures 3.9 mmHg. Aortic valve area, by VTI measures 2.52 cm. Pulmonic Valve: The pulmonic valve was normal in structure. Pulmonic valve regurgitation is not visualized. No evidence of pulmonic stenosis. Aorta: The aortic root is normal in size and structure. Venous: The inferior vena cava is normal in size with greater than 50% respiratory variability, suggesting right atrial pressure of 3 mmHg. IAS/Shunts: No atrial level shunt detected by color flow Doppler.  LEFT VENTRICLE PLAX 2D LVIDd:         3.40 cm   Diastology LVIDs:         2.10 cm   LV e' medial:    6.64 cm/s LV PW:         1.00 cm   LV E/e' medial:  11.4 LV IVS:        1.20 cm   LV e' lateral:   7.51 cm/s LVOT diam:     1.80 cm   LV E/e' lateral: 10.1 LV SV:         50 LV SV Index:   33  LVOT Area:     2.54 cm  RIGHT VENTRICLE RV Basal diam:  3.50 cm RV Mid diam:    3.80 cm RV S prime:     12.90 cm/s TAPSE (M-mode): 1.9 cm LEFT ATRIUM             Index        RIGHT ATRIUM           Index LA diam:        3.00 cm 2.01 cm/m   RA Area:     14.00 cm LA Vol (A2C):   34.8 ml 23.34 ml/m  RA Volume:   34.70 ml  23.27 ml/m LA Vol (A4C):   29.2 ml 19.59 ml/m LA Biplane Vol: 31.9 ml 21.40 ml/m  AORTIC VALVE                    PULMONIC VALVE AV Area (Vmax):    2.25 cm     PV Vmax:       1.46 m/s AV Area (Vmean):   2.25 cm     PV Peak grad:  8.5 mmHg AV Area (VTI):     2.52 cm AV Vmax:           98.70 cm/s AV Vmean:          63.800 cm/s AV VTI:            0.197 m AV Peak Grad:      3.9 mmHg AV Mean Grad:      2.0  mmHg LVOT Vmax:         87.10 cm/s LVOT Vmean:        56.300 cm/s LVOT VTI:          0.195 m LVOT/AV VTI ratio: 0.99  AORTA Ao Root diam: 2.30 cm MITRAL VALVE               TRICUSPID VALVE MV Area (PHT): 4.86 cm    TR Peak grad:   21.9 mmHg MV Area VTI:   2.85 cm    TR Vmax:        234.00 cm/s MV Peak grad:  3.3 mmHg MV Mean grad:  1.0 mmHg    SHUNTS MV Vmax:       0.90 m/s    Systemic VTI:  0.20 m MV Vmean:      51.5 cm/s   Systemic Diam: 1.80 cm MV Decel Time: 156 msec MV E velocity: 75.90 cm/s MV A velocity: 76.50 cm/s MV E/A ratio:  0.99 Evalene Lunger MD Electronically signed by Evalene Lunger MD Signature Date/Time: 05/12/2023/8:36:33 AM    Final    CT Angio Chest PE W and/or Wo Contrast Result Date: 05/10/2023 CLINICAL DATA:  68 year old female with history of lung cancer status post radiation chemotherapy presenting for concerns of increasing swelling in legs. EXAM: CT ANGIOGRAPHY CHEST WITH CONTRAST TECHNIQUE: Multidetector CT imaging of the chest was performed using the standard protocol during bolus administration of intravenous contrast. Multiplanar CT image reconstructions and MIPs were obtained to evaluate the vascular anatomy. RADIATION DOSE REDUCTION: This exam was performed  according to the departmental dose-optimization program which includes automated exposure control, adjustment of the mA and/or kV according to patient size and/or use of iterative reconstruction technique. CONTRAST:  36mL OMNIPAQUE  IOHEXOL  350 MG/ML SOLN COMPARISON:  Same day chest radiograph and CT chest 01/23/2023 FINDINGS: Cardiovascular: Segmental and subsegmental pulmonary emboli in the right lower lobe pulmonary arteries. There is evidence of right heart strain with RV-LV ratio of 1.2 and mild flattening of the interventricular septum. No pericardial effusion. Crescentic filling defect in the lower thoracic descending aorta extending into the upper abdominal aorta terminating at the level of the celiac axis. This is not definitively seen on prior imaging and may represent a thrombosed dissection. The aorta in the area of thrombosed dissection measures 2.8 x 2.5 cm and the true lumen measures 2.1 cm. Aortic atherosclerotic calcification. Mediastinum/Nodes: Trachea and esophagus are unremarkable. No thoracic adenopathy Lungs/Pleura: Emphysema. Diffuse bronchial wall thickening. No focal consolidation, pleural effusion, or pneumothorax. Multiple pulmonary nodules are redemonstrated. The largest nodules in the left upper lobe on series 7/image 106 and measures 16 by 10 mm, previously 14 x 13 mm. The largest nodule in the left lower lobe measures 11 x 8 mm, previously 11 x 9 mm. Ground-glass nodules along the right minor fissure measure 10 and 5 mm, respectively Upper Abdomen: No acute abnormality. Musculoskeletal: No acute fracture. Remote pathologic right ninth posterior rib fracture. Review of the MIP images confirms the above findings. IMPRESSION: 1. Segmental and subsegmental pulmonary emboli in the right lower lobe pulmonary arteries. Positive for acute PE with CT evidence of right heart strain (RV/LV Ratio = 1.2) consistent with at least submassive (intermediate risk) PE. The presence of right heart strain  has been associated with an increased risk of morbidity and mortality. Please refer to the Code PE Focused order set in EPIC. 2. Crescentic filling defect in the lower thoracic descending aorta extending into the upper abdominal aorta is  not definitively seen on prior imaging and may represent a thrombosed dissection. 3. Similar appearance of multiple pulmonary nodules. Aortic Atherosclerosis (ICD10-I70.0) and Emphysema (ICD10-J43.9). Critical Value/emergent results were called by telephone at the time of interpretation on 05/10/2023 at 7:41pm to provider PA Franky Moody, who verbally acknowledged these results. Electronically Signed   By: Norman Gatlin M.D.   On: 05/10/2023 19:54   US  Venous Img Lower Bilateral Result Date: 05/10/2023 CLINICAL DATA:  Bilateral lower extremity edema EXAM: BILATERAL LOWER EXTREMITY VENOUS DOPPLER ULTRASOUND TECHNIQUE: Gray-scale sonography with graded compression, as well as color Doppler and duplex ultrasound were performed to evaluate the lower extremity deep venous systems from the level of the common femoral vein and including the common femoral, femoral, profunda femoral, popliteal and calf veins including the posterior tibial, peroneal and gastrocnemius veins when visible. The superficial great saphenous vein was also interrogated. Spectral Doppler was utilized to evaluate flow at rest and with distal augmentation maneuvers in the common femoral, femoral and popliteal veins. COMPARISON:  None Available. FINDINGS: RIGHT LOWER EXTREMITY Common Femoral Vein: No evidence of thrombus. Normal compressibility, respiratory phasicity and response to augmentation. Saphenofemoral Junction: No evidence of thrombus. Normal compressibility and flow on color Doppler imaging. Profunda Femoral Vein: No evidence of thrombus. Normal compressibility and flow on color Doppler imaging. Femoral Vein: No evidence of thrombus. Normal compressibility, respiratory phasicity and response to  augmentation. Popliteal Vein: No evidence of thrombus. Normal compressibility, respiratory phasicity and response to augmentation. Calf Veins: No evidence of thrombus. Normal compressibility and flow on color Doppler imaging. Superficial Great Saphenous Vein: No evidence of thrombus. Normal compressibility. Venous Reflux:  None. Other Findings:  None. LEFT LOWER EXTREMITY Common Femoral Vein: No evidence of thrombus. Normal compressibility, respiratory phasicity and response to augmentation. Saphenofemoral Junction: No evidence of thrombus. Normal compressibility and flow on color Doppler imaging. Profunda Femoral Vein: No evidence of thrombus. Normal compressibility and flow on color Doppler imaging. Femoral Vein: No evidence of thrombus. Normal compressibility, respiratory phasicity and response to augmentation. Popliteal Vein: No evidence of thrombus. Normal compressibility, respiratory phasicity and response to augmentation. Calf Veins: No evidence of thrombus. Normal compressibility and flow on color Doppler imaging. Superficial Great Saphenous Vein: No evidence of thrombus. Normal compressibility. Venous Reflux:  None. Other Findings:  None. IMPRESSION: No evidence of deep venous thrombosis in either lower extremity. Electronically Signed   By: Wilkie Lent M.D.   On: 05/10/2023 16:40   DG Chest 2 View Result Date: 05/10/2023 CLINICAL DATA:  Shortness of breath. EXAM: CHEST - 2 VIEW COMPARISON:  Chest radiograph from 02/17/2023. FINDINGS: There are several nodules overlying the bilateral lung fields, marked with electronic arrow sign, increased in number since the prior study. Findings are concerning for metastatic disease. Further evaluation with nonemergent chest CT scan is recommended. Bilateral lung fields are otherwise clear. No acute consolidation or lung collapse. Bilateral costophrenic angles are clear. Normal cardio-mediastinal silhouette. No acute osseous abnormalities. The soft tissues are  within normal limits. Right-sided CT Port-A-Cath is again seen with its tip overlying the midportion of superior vena cava. IMPRESSION: *There are several bilateral lung nodules, increased in number since the prior study, concerning for worsening metastatic disease. Further evaluation with nonemergent chest CT scan is recommended. Electronically Signed   By: Ree Molt M.D.   On: 05/10/2023 16:18   DG Tibia/Fibula Left Result Date: 05/06/2023 CLINICAL DATA:  Blunt trauma to the lower left leg, initial encounter EXAM: LEFT TIBIA AND FIBULA - 2 VIEW COMPARISON:  None Available. FINDINGS: Mild soft tissue swelling is noted. No acute fracture or dislocation is seen. IMPRESSION: Swelling without acute bony abnormality. Electronically Signed   By: Oneil Devonshire M.D.   On: 05/06/2023 23:18   US  Venous Img Lower Unilateral Left Result Date: 05/04/2023 CLINICAL DATA:  Left lower extremity edema. EXAM: Left LOWER EXTREMITY VENOUS DOPPLER ULTRASOUND TECHNIQUE: Gray-scale sonography with graded compression, as well as color Doppler and duplex ultrasound were performed to evaluate the lower extremity deep venous systems from the level of the common femoral vein and including the common femoral, femoral, profunda femoral, popliteal and calf veins including the posterior tibial, peroneal and gastrocnemius veins when visible. The superficial great saphenous vein was also interrogated. Spectral Doppler was utilized to evaluate flow at rest and with distal augmentation maneuvers in the common femoral, femoral and popliteal veins. COMPARISON:  None Available. FINDINGS: Contralateral Common Femoral Vein: Respiratory phasicity is normal and symmetric with the symptomatic side. No evidence of thrombus. Normal compressibility. Common Femoral Vein: No evidence of thrombus. Normal compressibility, respiratory phasicity and response to augmentation. Saphenofemoral Junction: No evidence of thrombus. Normal compressibility and flow on  color Doppler imaging. Profunda Femoral Vein: No evidence of thrombus. Normal compressibility and flow on color Doppler imaging. Femoral Vein: No evidence of thrombus. Normal compressibility, respiratory phasicity and response to augmentation. Popliteal Vein: No evidence of thrombus. Normal compressibility, respiratory phasicity and response to augmentation. Calf Veins: Poor compression along the posterior tibial vein and poor flow on Doppler. Superficial Great Saphenous Vein: No evidence of thrombus. Normal compressibility. Venous Reflux:  None. Other Findings: Small fluid collection along the popliteal fossa measuring 2.2 x 1.3 x 0.7 cm. IMPRESSION: Small below-the-knee thrombus along the posterior tibial vein. No evidence of above the knee DVT. Small fluid collection in the popliteal fossa measuring 2.2 x 0.7 x 1.3 cm. Baker's cysts is possible but there is differential. Please correlate with any known history or additional evaluation as clinically appropriate Electronically Signed   By: Ranell Bring M.D.   On: 05/04/2023 12:07     Assessment and plan- Patient is a 68 y.o. female  with history of head and neck, breast cancer as well as adrenal carcinoma of unknown primary in the past now with non-small cell carcinoma of unknown primary predominantly with mediastinal adenopathy. She is being treated as possible non-small cell lung cancer.  On treatment assessment prior to cycle 5 of weekly CarboTaxol chemotherapy  Patient is not doing well presently and reports significant fatigue.  She is unable to swallow solid food and is mainly relying on chicken broth.  I will reach out to Dr. Jordis to see if feeding tube placement is an option for her even if it is a temporary measure to get her through this phase until she can complete radiation and recover from it.  Patient is agreeable to getting feeding tube.  Patient has a recent diagnosis of pulmonary embolism and therefore anticoagulation will need minimal  interruption when it comes to feeding tube placement and may be needed to be done as an inpatient.  I will discuss all this further with Dr. Pavone as well.  History of recent pulmonary embolism: Patient unable to swallow Eliquis  and I have emphasized the importance of picking up Lovenox  prescription today and taking it consistently without missing further doses.  I am holding off on giving her chemotherapy today and I will tentatively see her back in 1 week   Visit Diagnosis 1. Non-small cell cancer of left  lung (HCC)   2. Severe protein-calorie malnutrition (HCC)   3. Other acute pulmonary embolism, unspecified whether acute cor pulmonale present (HCC)      Dr. Annah Skene, MD, MPH [facility] [phone] 05/21/2023 7:09 PM

## 2023-05-22 ENCOUNTER — Ambulatory Visit: Payer: Medicare HMO

## 2023-05-22 ENCOUNTER — Other Ambulatory Visit: Payer: Self-pay | Admitting: *Deleted

## 2023-05-22 ENCOUNTER — Encounter: Payer: Self-pay | Admitting: Oncology

## 2023-05-22 ENCOUNTER — Other Ambulatory Visit: Payer: Medicare HMO

## 2023-05-22 ENCOUNTER — Ambulatory Visit
Admission: RE | Admit: 2023-05-22 | Discharge: 2023-05-22 | Disposition: A | Discharge status: Home / Self Care | Payer: Medicare HMO | Source: Ambulatory Visit | Attending: Radiation Oncology | Admitting: Radiation Oncology

## 2023-05-22 ENCOUNTER — Inpatient Hospital Stay (HOSPITAL_BASED_OUTPATIENT_CLINIC_OR_DEPARTMENT_OTHER): Payer: Medicare HMO | Admitting: Hospice and Palliative Medicine

## 2023-05-22 ENCOUNTER — Inpatient Hospital Stay (HOSPITAL_BASED_OUTPATIENT_CLINIC_OR_DEPARTMENT_OTHER): Payer: Medicare HMO | Admitting: Oncology

## 2023-05-22 ENCOUNTER — Other Ambulatory Visit: Payer: Self-pay

## 2023-05-22 ENCOUNTER — Ambulatory Visit: Payer: Medicare HMO | Admitting: Oncology

## 2023-05-22 ENCOUNTER — Inpatient Hospital Stay: Payer: Medicare HMO

## 2023-05-22 VITALS — BP 124/63 | HR 80 | Temp 96.9°F | Resp 19

## 2023-05-22 DIAGNOSIS — K208 Other esophagitis without bleeding: Secondary | ICD-10-CM | POA: Diagnosis not present

## 2023-05-22 DIAGNOSIS — R12 Heartburn: Secondary | ICD-10-CM | POA: Diagnosis not present

## 2023-05-22 DIAGNOSIS — C50511 Malignant neoplasm of lower-outer quadrant of right female breast: Secondary | ICD-10-CM | POA: Diagnosis not present

## 2023-05-22 DIAGNOSIS — C3412 Malignant neoplasm of upper lobe, left bronchus or lung: Secondary | ICD-10-CM | POA: Diagnosis not present

## 2023-05-22 DIAGNOSIS — Z5111 Encounter for antineoplastic chemotherapy: Secondary | ICD-10-CM | POA: Diagnosis not present

## 2023-05-22 DIAGNOSIS — Z510 Encounter for antineoplastic radiation therapy: Secondary | ICD-10-CM | POA: Diagnosis not present

## 2023-05-22 DIAGNOSIS — Z515 Encounter for palliative care: Secondary | ICD-10-CM

## 2023-05-22 DIAGNOSIS — E43 Unspecified severe protein-calorie malnutrition: Secondary | ICD-10-CM

## 2023-05-22 DIAGNOSIS — B379 Candidiasis, unspecified: Secondary | ICD-10-CM | POA: Diagnosis not present

## 2023-05-22 DIAGNOSIS — L270 Generalized skin eruption due to drugs and medicaments taken internally: Secondary | ICD-10-CM | POA: Diagnosis not present

## 2023-05-22 DIAGNOSIS — C3492 Malignant neoplasm of unspecified part of left bronchus or lung: Secondary | ICD-10-CM

## 2023-05-22 DIAGNOSIS — Z87891 Personal history of nicotine dependence: Secondary | ICD-10-CM | POA: Diagnosis not present

## 2023-05-22 DIAGNOSIS — Z853 Personal history of malignant neoplasm of breast: Secondary | ICD-10-CM | POA: Diagnosis not present

## 2023-05-22 LAB — CBC WITH DIFFERENTIAL (CANCER CENTER ONLY)
Abs Immature Granulocytes: 0 10*3/uL (ref 0.00–0.07)
Basophils Absolute: 0 10*3/uL (ref 0.0–0.1)
Basophils Relative: 1 %
Eosinophils Absolute: 0.1 10*3/uL (ref 0.0–0.5)
Eosinophils Relative: 2 %
HCT: 31.5 % — ABNORMAL LOW (ref 36.0–46.0)
Hemoglobin: 10.2 g/dL — ABNORMAL LOW (ref 12.0–15.0)
Lymphocytes Relative: 17 %
Lymphs Abs: 0.6 10*3/uL — ABNORMAL LOW (ref 0.7–4.0)
MCH: 32.5 pg (ref 26.0–34.0)
MCHC: 32.4 g/dL (ref 30.0–36.0)
MCV: 100.3 fL — ABNORMAL HIGH (ref 80.0–100.0)
Monocytes Absolute: 0.6 10*3/uL (ref 0.1–1.0)
Monocytes Relative: 17 %
Neutro Abs: 2.4 10*3/uL (ref 1.7–7.7)
Neutrophils Relative %: 63 %
Platelet Count: 255 10*3/uL (ref 150–400)
RBC: 3.14 MIL/uL — ABNORMAL LOW (ref 3.87–5.11)
RDW: 15.9 % — ABNORMAL HIGH (ref 11.5–15.5)
WBC Count: 3.8 10*3/uL — ABNORMAL LOW (ref 4.0–10.5)
nRBC: 0 % (ref 0.0–0.2)

## 2023-05-22 LAB — RAD ONC ARIA SESSION SUMMARY
Course Elapsed Days: 61
Plan Fractions Treated to Date: 28
Plan Prescribed Dose Per Fraction: 2 Gy
Plan Total Fractions Prescribed: 33
Plan Total Prescribed Dose: 66 Gy
Reference Point Dosage Given to Date: 56 Gy
Reference Point Session Dosage Given: 2 Gy
Session Number: 28

## 2023-05-22 LAB — CMP (CANCER CENTER ONLY)
ALT: 12 U/L (ref 0–44)
AST: 23 U/L (ref 15–41)
Albumin: 3.1 g/dL — ABNORMAL LOW (ref 3.5–5.0)
Alkaline Phosphatase: 60 U/L (ref 38–126)
Anion gap: 10 (ref 5–15)
BUN: 10 mg/dL (ref 8–23)
CO2: 25 mmol/L (ref 22–32)
Calcium: 8.7 mg/dL — ABNORMAL LOW (ref 8.9–10.3)
Chloride: 99 mmol/L (ref 98–111)
Creatinine: 0.74 mg/dL (ref 0.44–1.00)
GFR, Estimated: >60 mL/min (ref >60)
Glucose, Bld: 84 mg/dL (ref 70–99)
Potassium: 3.3 mmol/L — ABNORMAL LOW (ref 3.5–5.1)
Sodium: 134 mmol/L — ABNORMAL LOW (ref 135–145)
Total Bilirubin: 0.7 mg/dL (ref 0.0–1.2)
Total Protein: 5.9 g/dL — ABNORMAL LOW (ref 6.5–8.1)

## 2023-05-22 NOTE — Progress Notes (Unsigned)
Palliative Medicine [facility] at [facility] Telephone:([phone] [phone]   Name: Denise Watts Date: 05/22/2023 MRN: [id number]  DOB: [birth year]  Patient Care Team: Sherlyn Hay, DO as PCP - General (Family Medicine) Debbe Odea, MD as PCP - Cardiology (Cardiology) Creig Hines, MD as Consulting Physician (Oncology) Leafy Ro, MD as Consulting Physician (General Surgery) Dasher, Cliffton Asters, MD (Dermatology) Scheeler, Kermit Balo, PA-C as Physician Assistant (Plastic Surgery) Minna Antis, MD as Attending Physician (Emergency Medicine) Alvira Philips, Leitha Bleak (Neurology) Dayna Barker, MD as Referring Physician (Internal Medicine) Coralyn Helling, MD (Inactive) as Consulting Physician (Pulmonary Disease) Gaspar Cola, RPH (Inactive) (Pharmacist) Pa, [facility] (Optometry) Glory Buff, RN as Oncology Nurse Navigator Carmina Miller, MD as Consulting Physician (Radiation Oncology) Homsher, Wynona Canes, RN as VBCI Care Management    REASON FOR CONSULTATION: Denise Watts is a 68 y.o. 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  Palliative care was consulted to address goals.  SOCIAL HISTORY:     reports that she quit smoking about 28 years ago. Her smoking use included cigarettes. She started smoking about 58 years ago. She has a 45 pack-year smoking history. She has been exposed to tobacco smoke. She has never used smokeless tobacco. She reports that she does not currently use alcohol after a past usage of about 4.0 standard drinks of alcohol per week. She reports that she does not use drugs.  Patient married lives at home with her husband.  She has a son and three daughters.  Patient previously worked in  Designer, fashion/clothing.  ADVANCE DIRECTIVES:    CODE STATUS:   PAST MEDICAL HISTORY: Past Medical History:  Diagnosis Date   Adenomatous colon polyp    Adrenal carcinoma, right (HCC) 12/28/2016   a.) high grade carcinoma with extensive necrosis --> s/p adrenalectomy   Anxiety    Aortic atherosclerosis (HCC)    Arthritis of right knee    Atrial fibrillation and flutter (HCC)    a.) CHA2DS2-VASc = 3 (age, sex, vascular disease history) as of 02/13/2023; b.) cardiac rate/rhythm maintained intrinsically without pharmacological intervention; no chronic OAC (does take low dose ASA)   B12 deficiency    Breast cancer (HCC) 1997   a.) stage IB (cT1a, cN0, cM0, G3, ER+, PR-, HER2-); s/p lumpectomy + XRT   COPD (chronic obstructive pulmonary disease) (HCC)    Coronary artery disease    a.) cCTA 12/09/2021: Ca2+ = 135 (80th %'ile for age/sex/race match contol) --> 25-49% pLAD and RCA distributions   Depression    Diastolic dysfunction    a.) TTE 12/11/2020, no RWMAs, norm RVSF, G1DD; b.) TTE 12/14/2021: EF 55-60, no RWMAs, norm RVSF, G1DD; c.) TTE 01/27/2023: EF 50-55%, no RWMAs, G1DD, mild RAE, mild MR, mod TR   Dry cough 03/02/2017   Dyspnea    Full dentures    GERD (gastroesophageal reflux disease)    History of hiatal hernia    HLD (hyperlipidemia)    Invasive carcinoma of breast (HCC) 11/15/2017   a.) path (+) for G3, ER -, PR -, HER2/neu - ; s/p lumpectomy 01/17/2018 + 4 cycles TC systemic chemotherapy + XRT   Long term current use of aspirin    Personal history of chemotherapy    Personal history of radiation therapy  Pneumothorax of left lung after biopsy 01/25/2023   a.) hypoxic in PACU requiring re-intubation --> transferred to ICU --> chest tube placed by PCCM   RLS (restless legs syndrome)    a.) on ropinirole   Skin cancer    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   a.) Stage IB (cT1a, cN0, cM0, G3, ER+, PR-, HER2-)    PAST SURGICAL HISTORY:  Past Surgical History:   Procedure Laterality Date   BIOPSY  01/05/2023   Procedure: BIOPSY;  Surgeon: Toney Reil, MD;  Location: [facility] ENDOSCOPY;  Service: Gastroenterology;;   BREAST BIOPSY Right 1997   positive   BREAST BIOPSY Right 11/15/2017   INVASIVE MAMMARY CARCINOMA triple negative   BREAST BIOPSY Right 07/20/2020   [place] bx, Q marker, neg   BREAST LUMPECTOMY Right 1997   2019 also   BREAST LUMPECTOMY WITH SENTINEL LYMPH NODE BIOPSY Right 12/08/2017   Procedure: BREAST LUMPECTOMY WITH SENTINEL LYMPH NODE BX;  Surgeon: Earline Mayotte, MD;  Location: [facility] ORS;  Service: General;  Laterality: Right;   BREAST REDUCTION WITH MASTOPEXY Left 06/10/2019   Procedure: Left breast mastopexy reduction for symmetry;  Surgeon: Peggye Form, DO;  Location: [facility];  Service: Plastics;  Laterality: Left;  total 2.5 hours   CATARACT EXTRACTION W/PHACO Right 02/07/2022   Procedure: CATARACT EXTRACTION PHACO AND INTRAOCULAR LENS PLACEMENT (IOC) RIGHT DIABETIC;  Surgeon: Nevada Crane, MD;  Location: [facility];  Service: Ophthalmology;  Laterality: Right;  3.60 00:34.7   CATARACT EXTRACTION W/PHACO Left 02/28/2022   Procedure: CATARACT EXTRACTION PHACO AND INTRAOCULAR LENS PLACEMENT (IOC) LEFT DIABETIC 4.11 00:26.1;  Surgeon: Nevada Crane, MD;  Location: [facility];  Service: Ophthalmology;  Laterality: Left;   COLONOSCOPY  2015   COLONOSCOPY N/A 01/05/2023   Procedure: COLONOSCOPY;  Surgeon: Toney Reil, MD;  Location: [facility] ENDOSCOPY;  Service: Gastroenterology;  Laterality: N/A;   COLONOSCOPY WITH PROPOFOL N/A 10/29/2019   Procedure: COLONOSCOPY WITH PROPOFOL;  Surgeon: Midge Minium, MD;  Location: [facility] END[facility]COPY;  Service: Endoscopy;  Laterality: N/A;   ESOPHAGOGASTRODUODENOSCOPY (EGD) WITH PROPOFOL N/A 05/24/2019   Procedure: ESOPHAGOGASTRODUODENOSCOPY (EGD) WITH PROPOFOL;  Surgeon: Midge Minium, MD;  Location: [facility] ENDOSCOPY;  Service: Endoscopy;   Laterality: N/A;   ESOPHAGOGASTRODUODENOSCOPY (EGD) WITH PROPOFOL N/A 09/06/2022   Procedure: ESOPHAGOGASTRODUODENOSCOPY (EGD) WITH PROPOFOL;  Surgeon: Midge Minium, MD;  Location: [facility] ENDOSCOPY;  Service: Endoscopy;  Laterality: N/A;   ESOPHAGOGASTRODUODENOSCOPY (EGD) WITH PROPOFOL  01/05/2023   Procedure: ESOPHAGOGASTRODUODENOSCOPY (EGD) WITH PROPOFOL;  Surgeon: Toney Reil, MD;  Location: [facility];  Service: Gastroenterology;;   ESOPHAGUS SURGERY     IR IMAGING GUIDED PORT INSERTION  02/07/2023   KNEE SURGERY Right    LIPOSUCTION WITH LIPOFILLING Right 06/10/2019   Procedure: right breast scar and capsule release with fat grafting;  Surgeon: Peggye Form, DO;  Location: [facility];  Service: Plastics;  Laterality: Right;   POLYPECTOMY  01/05/2023   Procedure: POLYPECTOMY;  Surgeon: Toney Reil, MD;  Location: [facility] ENDOSCOPY;  Service: Gastroenterology;;   [facility] REMOVAL     PORTACATH PLACEMENT Right 01/17/2018   Procedure: INSERTION PORT-A-CATH- RIGHT;  Surgeon: Earline Mayotte, MD;  Location: [facility] ORS;  Service: General;  Laterality: Right;   PULMONARY THROMBECTOMY Bilateral 05/12/2023   Procedure: PULMONARY THROMBECTOMY;  Surgeon: Annice Needy, MD;  Location: [facility];  Service: Cardiovascular;  Laterality: Bilateral;   ROBOTIC ADRENALECTOMY Right 12/28/2016   Procedure:  ROBOTIC ADRENALECTOMY;  Surgeon: Vanna Scotland, MD;  Location: [facility]S;  Service: Urology;  Laterality: Right;   SKIN CANCER EXCISION     THROAT SURGERY  1998   throat cancer    TUBAL LIGATION     VENTRAL HERNIA REPAIR N/A 03/03/2017   Procedure: HERNIA REPAIR VENTRAL ADULT;  Surgeon: Ricarda Frame, MD;  Location: [facility] ORS;  Service: General;  Laterality: N/A;   VIDEO BRONCHOSCOPY WITH ENDOBRONCHIAL ULTRASOUND N/A 01/25/2023   Procedure: VIDEO BRONCHOSCOPY WITH ENDOBRONCHIAL ULTRASOUND;  Surgeon: Vida Rigger, MD;  Location: [facility]S;  Service: Thoracic;   Laterality: N/A;   VIDEO BRONCHOSCOPY WITH ENDOBRONCHIAL ULTRASOUND N/A 02/17/2023   Procedure: VIDEO BRONCHOSCOPY WITH ENDOBRONCHIAL ULTRASOUND;  Surgeon: Vida Rigger, MD;  Location: [facility];  Service: Thoracic;  Laterality: N/A;    HEMATOLOGY/ONCOLOGY HISTORY:  Oncology History Overview Note  1. Carcinoma of breast, T1, N0, M0 tumor diagnosis.  In January of 1997 2. Carcinoma of the vocal cord T1, N0, M0 tumor.  Status postradiation therapy 3. Abnormal CT scan of the chest (December, 2015) 4. Repeat CT scan of chest shows stable nodule (July, 2016) 5. Neutropenia with normal hemoglobin and normal platelet count (July, 2016)   Cancer of vocal cord [facility]) (Resolved)  11/10/2014 Initial Diagnosis   Cancer of vocal cord   Malignant neoplasm of lower-outer quadrant of right breast of female, estrogen receptor negative (HCC)  11/24/2017 Initial Diagnosis   Malignant neoplasm of lower-outer quadrant of right breast of female, estrogen receptor negative (HCC)   01/12/2018 Cancer Staging   Staging form: Breast, AJCC 8th Edition - Clinical stage from 01/12/2018: Stage IB (cT1a, cN0, cM0, G3, ER+, PR-, HER2-) - Signed by Creig Hines, MD on 01/12/2018   01/30/2018 - 04/26/2018 Chemotherapy   The patient had dexamethasone (DECADRON) 4 MG tablet, 8 mg, Oral, 2 times daily, 1 of 1 cycle, Start date: 01/12/2018, End date: 08/03/2018 palonosetron (ALOXI) injection 0.25 mg, 0.25 mg, Intravenous,  Once, 4 of 4 cycles Administration: 0.25 mg (01/30/2018), 0.25 mg (02/27/2018), 0.25 mg (03/28/2018), 0.25 mg (04/26/2018) pegfilgrastim (NEULASTA ONPRO KIT) injection 6 mg, 6 mg, Subcutaneous, Once, 4 of 4 cycles Administration: 6 mg (01/30/2018), 6 mg (02/27/2018), 6 mg (03/28/2018), 6 mg (04/26/2018) cyclophosphamide (CYTOXAN) 1,000 mg in sodium chloride 0.9 % 250 mL chemo infusion, 600 mg/m2 = 1,000 mg, Intravenous,  Once, 4 of 4 cycles Administration: 1,000 mg (01/30/2018), 1,000 mg (02/27/2018), 1,000 mg  (03/28/2018), 1,000 mg (04/26/2018) DOCEtaxel (TAXOTERE) 130 mg in sodium chloride 0.9 % 250 mL chemo infusion, 75 mg/m2 = 130 mg, Intravenous,  Once, 4 of 4 cycles Dose modification: 60 mg/m2 (original dose 75 mg/m2, Cycle 2, Reason: Dose not tolerated) Administration: 130 mg (01/30/2018), 100 mg (02/27/2018), 100 mg (03/28/2018), 100 mg (04/26/2018)  for chemotherapy treatment.    Non-small cell lung cancer (HCC)  03/09/2023 Initial Diagnosis   Non-small cell lung cancer (HCC)   03/20/2023 -  Chemotherapy   Patient is on Treatment Plan : LUNG Carboplatin + Paclitaxel q7d       ALLERGIES:  is allergic to sulfa antibiotics.  MEDICATIONS:  Current Outpatient Medications  Medication Sig Dispense Refill   benzonatate (TESSALON) 100 MG capsule Take 2 capsules (200 mg total) by mouth 2 (two) times daily as needed for cough. 180 capsule 0   enoxaparin (LOVENOX) 60 MG/0.6ML injection Inject 0.525 mLs (52 mg total) into the skin every 12 (twelve) hours. 32 mL 0   fentaNYL (DURAGESIC) 12 MCG/HR Place 1 patch onto the skin  every 3 (three) days. 5 patch 0   fluconazole (DIFLUCAN) 10 MG/ML suspension TAKE 10 ML (100 MG TOTAL) BY MOUTH DAILY 175 mL 0   lidocaine (LIDODERM) 5 % Place 1 patch onto the skin daily. Remove & Discard patch within 12 hours or as directed by MD     lidocaine-prilocaine (EMLA) cream Apply to affected area once 30 g 3   metoprolol succinate (TOPROL-XL) 25 MG 24 hr tablet Take 1 tablet (25 mg total) by mouth daily. 30 tablet 0   omeprazole (KONVOMEP) 2 mg/mL SUSP oral suspension Take 20 mLs (40 mg total) by mouth daily. 240 mL 1   ondansetron (ZOFRAN-ODT) 4 MG disintegrating tablet Take 1 tablet (4 mg total) by mouth every 8 (eight) hours as needed for nausea or vomiting. 20 tablet 0   oxyCODONE (ROXICODONE) 5 MG/5ML solution Take 5 mLs (5 mg total) by mouth every 4 (four) hours as needed for severe pain (pain score 7-10). 200 mL 0   rOPINIRole (REQUIP) 0.25 MG tablet Take by  mouth.     rosuvastatin (CRESTOR) 40 MG tablet TAKE 1 TABLET BY MOUTH DAILY 90 tablet 1   sucralfate (CARAFATE) 1 GM/10ML suspension Take 10 mLs (1 g total) by mouth 4 (four) times daily -  with meals and at bedtime. 420 mL 0   No current facility-administered medications for this visit.   Facility-Administered Medications Ordered in Other Visits  Medication Dose Route Frequency Provider Last Rate Last Admin   cyanocobalamin ((VITAMIN B-12)) injection 1,000 mcg  1,000 mcg Intramuscular Q30 days Creig Hines, MD   1,000 mcg at 08/27/21 1537   cyanocobalamin (VITAMIN B12) injection 1,000 mcg  1,000 mcg Intramuscular Q30 days Creig Hines, MD   1,000 mcg at 03/16/22 1101    VITAL SIGNS: There were no vitals taken for this visit. There were no vitals filed for this visit.  Estimated body mass index is 20.88 kg/m as calculated from the following:   Height as of 05/10/23: 5\' 1"  (1.549 m).   Weight as of 05/15/23: 110 lb 8 oz (50.1 kg).  LABS: CBC:    Component Value Date/Time   WBC 3.8 (L) 05/22/2023 1140   WBC 2.5 (L) 05/13/2023 0950   HGB 10.2 (L) 05/22/2023 1140   HGB 14.0 11/03/2021 1551   HCT 31.5 (L) 05/22/2023 1140   HCT 42.1 11/03/2021 1551   PLT 255 05/22/2023 1140   PLT 216 11/03/2021 1551   MCV 100.3 (H) 05/22/2023 1140   MCV 97 11/03/2021 1551   MCV 100 04/08/2014 0409   NEUTROABS 2.4 05/22/2023 1140   NEUTROABS 3.5 11/03/2021 1551   NEUTROABS 6.1 04/08/2014 0409   LYMPHSABS 0.6 (L) 05/22/2023 1140   LYMPHSABS 1.2 11/03/2021 1551   LYMPHSABS 0.8 (L) 04/08/2014 0409   MONOABS 0.6 05/22/2023 1140   MONOABS 0.7 04/08/2014 0409   EOSABS 0.1 05/22/2023 1140   EOSABS 0.2 11/03/2021 1551   EOSABS 0.0 04/08/2014 0409   BASOSABS 0.0 05/22/2023 1140   BASOSABS 0.0 11/03/2021 1551   BASOSABS 0.0 04/08/2014 0409   Comprehensive Metabolic Panel:    Component Value Date/Time   NA 134 (L) 05/22/2023 1140   NA 145 (H) 11/03/2021 1551   NA 143 04/08/2014 0409   K 3.3 (L)  05/22/2023 1140   K 3.5 04/08/2014 0409   CL 99 05/22/2023 1140   CL 113 (H) 04/08/2014 0409   CO2 25 05/22/2023 1140   CO2 26 04/08/2014 0409   BUN 10 05/22/2023  1140   BUN 13 11/03/2021 1551   BUN 6 (L) 04/08/2014 0409   CREATININE 0.74 05/22/2023 1140   CREATININE 0.67 04/08/2014 0409   GLUCOSE 84 05/22/2023 1140   GLUCOSE 84 04/08/2014 0409   CALCIUM 8.7 (L) 05/22/2023 1140   CALCIUM 7.5 (L) 04/08/2014 0409   AST 23 05/22/2023 1140   ALT 12 05/22/2023 1140   ALT 21 11/26/2012 1019   ALKPHOS 60 05/22/2023 1140   ALKPHOS 87 11/26/2012 1019   BILITOT 0.7 05/22/2023 1140   PROT 5.9 (L) 05/22/2023 1140   PROT 6.3 11/03/2021 1551   PROT 6.4 11/26/2012 1019   ALBUMIN 3.1 (L) 05/22/2023 1140   ALBUMIN 4.1 11/03/2021 1551   ALBUMIN 3.5 11/26/2012 1019    RADIOGRAPHIC STUDIES: PERIPHERAL VASCULAR CATHETERIZATION Result Date: 05/12/2023 See surgical note for result.  ECHOCARDIOGRAM COMPLETE Result Date: 05/12/2023    ECHOCARDIOGRAM REPORT   Patient Name:   KAMIYAH SUSKO Date of Exam: 05/11/2023 Medical Rec #:  [id number]    Height:       61.0 in Accession #:    [id number]   Weight:       114.6 lb Date of Birth:  09/29/1955    BSA:          1.491 m Patient Age:    67 years     BP:           115/67 mmHg Patient Gender: F            HR:           92 bpm. Exam Location:  [facility] Procedure: 2D Echo, Cardiac Doppler and Color Doppler Indications:     Pulmonary embolus  History:         Patient has prior history of Echocardiogram examinations, most                  recent 01/27/2023. CHF, CAD, COPD, Arrythmias:Atrial                  Fibrillation and Atrial Flutter; Risk Factors:Dyslipidemia.                  Breast CA, CKD.  Sonographer:     Mikki Harbor Referring Phys:  [id number] Lorretta Harp Diagnosing Phys: Julien Nordmann MD  Sonographer Comments: Technically difficult study due to poor echo windows. Image acquisition challenging due to respiratory motion. IMPRESSIONS  1. Left ventricular ejection  fraction, by estimation, is 60 to 65%. The left ventricle has normal function. The left ventricle has no regional wall motion abnormalities. There is mild left ventricular hypertrophy. Left ventricular diastolic parameters are consistent with Grade II diastolic dysfunction (pseudonormalization).  2. Right ventricular systolic function is normal. The right ventricular size is mildly enlarged. There is normal pulmonary artery systolic pressure. The estimated right ventricular systolic pressure is 29.9 mmHg.  3. The mitral valve is normal in structure. Mild mitral valve regurgitation. No evidence of mitral stenosis.  4. Tricuspid valve regurgitation is mild to moderate.  5. The aortic valve is normal in structure. Aortic valve regurgitation is not visualized. No aortic stenosis is present.  6. The inferior vena cava is normal in size with greater than 50% respiratory variability, suggesting right atrial pressure of 3 mmHg. FINDINGS  Left Ventricle: Left ventricular ejection fraction, by estimation, is 60 to 65%. The left ventricle has normal function. The left ventricle has no regional wall motion abnormalities. The left ventricular internal cavity size was normal in size. There is  mild left ventricular hypertrophy. Left ventricular diastolic parameters are consistent with Grade II diastolic dysfunction (pseudonormalization). Right Ventricle: The right ventricular size is mildly enlarged. No increase in right ventricular wall thickness. Right ventricular systolic function is normal. There is normal pulmonary artery systolic pressure. The tricuspid regurgitant velocity is 2.34  m/s, and with an assumed right atrial pressure of 8 mmHg, the estimated right ventricular systolic pressure is 29.9 mmHg. Left Atrium: Left atrial size was normal in size. Right Atrium: Right atrial size was normal in size. Pericardium: There is no evidence of pericardial effusion. Mitral Valve: The mitral valve is normal in structure. Mild  mitral valve regurgitation. No evidence of mitral valve stenosis. MV peak gradient, 3.3 mmHg. The mean mitral valve gradient is 1.0 mmHg. Tricuspid Valve: The tricuspid valve is normal in structure. Tricuspid valve regurgitation is mild to moderate. No evidence of tricuspid stenosis. Aortic Valve: The aortic valve is normal in structure. Aortic valve regurgitation is not visualized. No aortic stenosis is present. Aortic valve mean gradient measures 2.0 mmHg. Aortic valve peak gradient measures 3.9 mmHg. Aortic valve area, by VTI measures 2.52 cm. Pulmonic Valve: The pulmonic valve was normal in structure. Pulmonic valve regurgitation is not visualized. No evidence of pulmonic stenosis. Aorta: The aortic root is normal in size and structure. Venous: The inferior vena cava is normal in size with greater than 50% respiratory variability, suggesting right atrial pressure of 3 mmHg. IAS/Shunts: No atrial level shunt detected by color flow Doppler.  LEFT VENTRICLE PLAX 2D LVIDd:         3.40 cm   Diastology LVIDs:         2.10 cm   LV e' medial:    6.64 cm/s LV PW:         1.00 cm   LV E/e' medial:  11.4 LV IVS:        1.20 cm   LV e' lateral:   7.51 cm/s LVOT diam:     1.80 cm   LV E/e' lateral: 10.1 LV SV:         50 LV SV Index:   33 LVOT Area:     2.54 cm  RIGHT VENTRICLE RV Basal diam:  3.50 cm RV Mid diam:    3.80 cm RV S prime:     12.90 cm/s TAPSE (M-mode): 1.9 cm LEFT ATRIUM             Index        RIGHT ATRIUM           Index LA diam:        3.00 cm 2.01 cm/m   RA Area:     14.00 cm LA Vol (A2C):   34.8 ml 23.34 ml/m  RA Volume:   34.70 ml  23.27 ml/m LA Vol (A4C):   29.2 ml 19.59 ml/m LA Biplane Vol: 31.9 ml 21.40 ml/m  AORTIC VALVE                    PULMONIC VALVE AV Area (Vmax):    2.25 cm     PV Vmax:       1.46 m/s AV Area (Vmean):   2.25 cm     PV Peak grad:  8.5 mmHg AV Area (VTI):     2.52 cm AV Vmax:           98.70 cm/s AV Vmean:          63.800 cm/s AV VTI:  0.197 m AV Peak  Grad:      3.9 mmHg AV Mean Grad:      2.0 mmHg LVOT Vmax:         87.10 cm/s LVOT Vmean:        56.300 cm/s LVOT VTI:          0.195 m LVOT/AV VTI ratio: 0.99  AORTA Ao Root diam: 2.30 cm MITRAL VALVE               TRICUSPID VALVE MV Area (PHT): 4.86 cm    TR Peak grad:   21.9 mmHg MV Area VTI:   2.85 cm    TR Vmax:        234.00 cm/s MV Peak grad:  3.3 mmHg MV Mean grad:  1.0 mmHg    SHUNTS MV Vmax:       0.90 m/s    Systemic VTI:  0.20 m MV Vmean:      51.5 cm/s   Systemic Diam: 1.80 cm MV Decel Time: 156 msec MV E velocity: 75.90 cm/s MV A velocity: 76.50 cm/s MV E/A ratio:  0.99 Julien Nordmann MD Electronically signed by Julien Nordmann MD Signature Date/Time: 05/12/2023/8:36:33 AM    Final    CT Angio Chest PE W and/or Wo Contrast Result Date: 05/10/2023 CLINICAL DATA:  68 year old female with history of lung cancer status post radiation chemotherapy presenting for concerns of increasing swelling in legs. EXAM: CT ANGIOGRAPHY CHEST WITH CONTRAST TECHNIQUE: Multidetector CT imaging of the chest was performed using the standard protocol during bolus administration of intravenous contrast. Multiplanar CT image reconstructions and MIPs were obtained to evaluate the vascular anatomy. RADIATION DOSE REDUCTION: This exam was performed according to the departmental dose-optimization program which includes automated exposure control, adjustment of the mA and/or kV according to patient size and/or use of iterative reconstruction technique. CONTRAST:  36mL OMNIPAQUE IOHEXOL 350 MG/ML SOLN COMPARISON:  Same day chest radiograph and CT chest 01/23/2023 FINDINGS: Cardiovascular: Segmental and subsegmental pulmonary emboli in the right lower lobe pulmonary arteries. There is evidence of right heart strain with RV-LV ratio of 1.2 and mild flattening of the interventricular septum. No pericardial effusion. Crescentic filling defect in the lower thoracic descending aorta extending into the upper abdominal aorta terminating at  the level of the celiac axis. This is not definitively seen on prior imaging and may represent a thrombosed dissection. The aorta in the area of thrombosed dissection measures 2.8 x 2.5 cm and the true lumen measures 2.1 cm. Aortic atherosclerotic calcification. Mediastinum/Nodes: Trachea and esophagus are unremarkable. No thoracic adenopathy Lungs/Pleura: Emphysema. Diffuse bronchial wall thickening. No focal consolidation, pleural effusion, or pneumothorax. Multiple pulmonary nodules are redemonstrated. The largest nodules in the left upper lobe on series 7/image 106 and measures 16 by 10 mm, previously 14 x 13 mm. The largest nodule in the left lower lobe measures 11 x 8 mm, previously 11 x 9 mm. Ground-glass nodules along the right minor fissure measure 10 and 5 mm, respectively Upper Abdomen: No acute abnormality. Musculoskeletal: No acute fracture. Remote pathologic right ninth posterior rib fracture. Review of the MIP images confirms the above findings. IMPRESSION: 1. Segmental and subsegmental pulmonary emboli in the right lower lobe pulmonary arteries. Positive for acute PE with CT evidence of right heart strain (RV/LV Ratio = 1.2) consistent with at least submassive (intermediate risk) PE. The presence of right heart strain has been associated with an increased risk of morbidity and mortality. Please refer to the "Code PE Focused"  order set in EPIC. 2. Crescentic filling defect in the lower thoracic descending aorta extending into the upper abdominal aorta is not definitively seen on prior imaging and may represent a thrombosed dissection. 3. Similar appearance of multiple pulmonary nodules. Aortic Atherosclerosis (ICD10-I70.0) and Emphysema (ICD10-J43.9). Critical Value/emergent results were called by telephone at the time of interpretation on 05/10/2023 at 7:41pm to provider PA Guerry Minors, who verbally acknowledged these results. Electronically Signed   By: Minerva Fester M.D.   On: 05/10/2023 19:54    US Venous Img Lower Bilateral Result Date: 05/10/2023 CLINICAL DATA:  Bilateral lower extremity edema EXAM: BILATERAL LOWER EXTREMITY VENOUS DOPPLER ULTRASOUND TECHNIQUE: Gray-scale sonography with graded compression, as well as color Doppler and duplex ultrasound were performed to evaluate the lower extremity deep venous systems from the level of the common femoral vein and including the common femoral, femoral, profunda femoral, popliteal and calf veins including the posterior tibial, peroneal and gastrocnemius veins when visible. The superficial great saphenous vein was also interrogated. Spectral Doppler was utilized to evaluate flow at rest and with distal augmentation maneuvers in the common femoral, femoral and popliteal veins. COMPARISON:  None Available. FINDINGS: RIGHT LOWER EXTREMITY Common Femoral Vein: No evidence of thrombus. Normal compressibility, respiratory phasicity and response to augmentation. Saphenofemoral Junction: No evidence of thrombus. Normal compressibility and flow on color Doppler imaging. Profunda Femoral Vein: No evidence of thrombus. Normal compressibility and flow on color Doppler imaging. Femoral Vein: No evidence of thrombus. Normal compressibility, respiratory phasicity and response to augmentation. Popliteal Vein: No evidence of thrombus. Normal compressibility, respiratory phasicity and response to augmentation. Calf Veins: No evidence of thrombus. Normal compressibility and flow on color Doppler imaging. Superficial Great Saphenous Vein: No evidence of thrombus. Normal compressibility. Venous Reflux:  None. Other Findings:  None. LEFT LOWER EXTREMITY Common Femoral Vein: No evidence of thrombus. Normal compressibility, respiratory phasicity and response to augmentation. Saphenofemoral Junction: No evidence of thrombus. Normal compressibility and flow on color Doppler imaging. Profunda Femoral Vein: No evidence of thrombus. Normal compressibility and flow on color Doppler  imaging. Femoral Vein: No evidence of thrombus. Normal compressibility, respiratory phasicity and response to augmentation. Popliteal Vein: No evidence of thrombus. Normal compressibility, respiratory phasicity and response to augmentation. Calf Veins: No evidence of thrombus. Normal compressibility and flow on color Doppler imaging. Superficial Great Saphenous Vein: No evidence of thrombus. Normal compressibility. Venous Reflux:  None. Other Findings:  None. IMPRESSION: No evidence of deep venous thrombosis in either lower extremity. Electronically Signed   By: Malachy Moan M.D.   On: 05/10/2023 16:40   DG Chest 2 View Result Date: 05/10/2023 CLINICAL DATA:  Shortness of breath. EXAM: CHEST - 2 VIEW COMPARISON:  Chest radiograph from 02/17/2023. FINDINGS: There are several nodules overlying the bilateral lung fields, marked with electronic arrow sign, increased in number since the prior study. Findings are concerning for metastatic disease. Further evaluation with nonemergent chest CT scan is recommended. Bilateral lung fields are otherwise clear. No acute consolidation or lung collapse. Bilateral costophrenic angles are clear. Normal cardio-mediastinal silhouette. No acute osseous abnormalities. The soft tissues are within normal limits. Right-sided CT Port-A-Cath is again seen with its tip overlying the midportion of superior vena cava. IMPRESSION: *There are several bilateral lung nodules, increased in number since the prior study, concerning for worsening metastatic disease. Further evaluation with nonemergent chest CT scan is recommended. Electronically Signed   By: Jules Schick M.D.   On: 05/10/2023 16:18   DG Tibia/Fibula Left Result Date: 05/06/2023  CLINICAL DATA:  Blunt trauma to the lower left leg, initial encounter EXAM: LEFT TIBIA AND FIBULA - 2 VIEW COMPARISON:  None Available. FINDINGS: Mild soft tissue swelling is noted. No acute fracture or dislocation is seen. IMPRESSION: Swelling  without acute bony abnormality. Electronically Signed   By: Alcide Clever M.D.   On: 05/06/2023 23:18   US Venous Img Lower Unilateral Left Result Date: 05/04/2023 CLINICAL DATA:  Left lower extremity edema. EXAM: Left LOWER EXTREMITY VENOUS DOPPLER ULTRASOUND TECHNIQUE: Gray-scale sonography with graded compression, as well as color Doppler and duplex ultrasound were performed to evaluate the lower extremity deep venous systems from the level of the common femoral vein and including the common femoral, femoral, profunda femoral, popliteal and calf veins including the posterior tibial, peroneal and gastrocnemius veins when visible. The superficial great saphenous vein was also interrogated. Spectral Doppler was utilized to evaluate flow at rest and with distal augmentation maneuvers in the common femoral, femoral and popliteal veins. COMPARISON:  None Available. FINDINGS: Contralateral Common Femoral Vein: Respiratory phasicity is normal and symmetric with the symptomatic side. No evidence of thrombus. Normal compressibility. Common Femoral Vein: No evidence of thrombus. Normal compressibility, respiratory phasicity and response to augmentation. Saphenofemoral Junction: No evidence of thrombus. Normal compressibility and flow on color Doppler imaging. Profunda Femoral Vein: No evidence of thrombus. Normal compressibility and flow on color Doppler imaging. Femoral Vein: No evidence of thrombus. Normal compressibility, respiratory phasicity and response to augmentation. Popliteal Vein: No evidence of thrombus. Normal compressibility, respiratory phasicity and response to augmentation. Calf Veins: Poor compression along the posterior tibial vein and poor flow on Doppler. Superficial Great Saphenous Vein: No evidence of thrombus. Normal compressibility. Venous Reflux:  None. Other Findings: Small fluid collection along the popliteal fossa measuring 2.2 x 1.3 x 0.7 cm. IMPRESSION: Small below-the-knee thrombus along the  posterior tibial vein. No evidence of above the knee DVT. Small fluid collection in the popliteal fossa measuring 2.2 x 0.7 x 1.3 cm. Baker's cysts is possible but there is differential. Please correlate with any known history or additional evaluation as clinically appropriate Electronically Signed   By: Karen Kays M.D.   On: 05/04/2023 12:07    PERFORMANCE STATUS (ECOG) : 2 - Symptomatic, <50% confined to bed  Review of Systems Unless otherwise noted, a complete review of systems is negative.  Physical Exam General: NAD Cardiovascular: regular rate and rhythm Pulmonary: clear ant fields Abdomen: soft, nontender, + bowel sounds GU: no suprapubic tenderness Extremities: no edema, no joint deformities Skin: no rashes Neurological: Weakness but otherwise nonfocal  IMPRESSION: Patient was an add-on to my schedule today at Dr. Assunta Gambles request.  Patient hospitalized last week with acute PE.  Patient has had difficulty swallowing pills and so could not take Eliquis and was therefore started on Lovenox.  Patient has also had difficulty maintaining adequate nutrition due to swallowing and has had significant weight loss.  Patient is interested in pursuing PEG placement to allow her to complete cancer treatment.  I called and spoke with Dr. Everlene Farrier who would be available to surgically place PEG on 05/25/23.  Patient would require hospitalization the day prior with rotation from Lovenox to heparin.  This will need to be coordinated with hospitalist team for admission.  Symptomatically, patient has had ongoing pain but this is improved with oxycodone elixir and fentanyl.  Patient sent home with a MOST form to review with family.  PLAN: -Continue current scope of treatment -Patient will likely require elective admission to  pursue PEG placement later this week -Continue fentanyl/oxycodone -MOST Form reviewed  Case and plan discussed with Dr. Smith Robert   Patient expressed understanding and was in  agreement with this plan. She also understands that She can call the clinic at any time with any questions, concerns, or complaints.     Time Total: 30 minutes  Visit consisted of counseling and education dealing with the complex and emotionally intense issues of symptom management and palliative care in the setting of serious and potentially life-threatening illness.Greater than 50%  of this time was spent counseling and coordinating care related to the above assessment and plan.  Signed by: Laurette Schimke, PhD, NP-C

## 2023-05-23 ENCOUNTER — Encounter (HOSPITAL_COMMUNITY): Payer: Self-pay

## 2023-05-23 ENCOUNTER — Ambulatory Visit: Payer: Medicare HMO

## 2023-05-23 ENCOUNTER — Other Ambulatory Visit: Payer: Self-pay

## 2023-05-23 ENCOUNTER — Encounter: Payer: Self-pay | Admitting: Family Medicine

## 2023-05-23 ENCOUNTER — Encounter: Payer: Self-pay | Admitting: Oncology

## 2023-05-23 ENCOUNTER — Other Ambulatory Visit: Payer: Self-pay | Admitting: Nurse Practitioner

## 2023-05-23 ENCOUNTER — Ambulatory Visit
Admission: RE | Admit: 2023-05-23 | Discharge: 2023-05-23 | Disposition: A | Discharge status: Home / Self Care | Payer: Medicare HMO | Source: Ambulatory Visit | Attending: Radiation Oncology | Admitting: Radiation Oncology

## 2023-05-23 DIAGNOSIS — B379 Candidiasis, unspecified: Secondary | ICD-10-CM | POA: Diagnosis not present

## 2023-05-23 DIAGNOSIS — Z510 Encounter for antineoplastic radiation therapy: Secondary | ICD-10-CM | POA: Diagnosis not present

## 2023-05-23 DIAGNOSIS — C50511 Malignant neoplasm of lower-outer quadrant of right female breast: Secondary | ICD-10-CM | POA: Diagnosis not present

## 2023-05-23 DIAGNOSIS — Z5111 Encounter for antineoplastic chemotherapy: Secondary | ICD-10-CM | POA: Diagnosis not present

## 2023-05-23 DIAGNOSIS — Z87891 Personal history of nicotine dependence: Secondary | ICD-10-CM | POA: Diagnosis not present

## 2023-05-23 DIAGNOSIS — Z853 Personal history of malignant neoplasm of breast: Secondary | ICD-10-CM | POA: Diagnosis not present

## 2023-05-23 DIAGNOSIS — L270 Generalized skin eruption due to drugs and medicaments taken internally: Secondary | ICD-10-CM | POA: Diagnosis not present

## 2023-05-23 DIAGNOSIS — C3412 Malignant neoplasm of upper lobe, left bronchus or lung: Secondary | ICD-10-CM | POA: Diagnosis not present

## 2023-05-23 DIAGNOSIS — R12 Heartburn: Secondary | ICD-10-CM | POA: Diagnosis not present

## 2023-05-23 DIAGNOSIS — K208 Other esophagitis without bleeding: Secondary | ICD-10-CM | POA: Diagnosis not present

## 2023-05-23 LAB — RAD ONC ARIA SESSION SUMMARY
Course Elapsed Days: 62
Plan Fractions Treated to Date: 29
Plan Prescribed Dose Per Fraction: 2 Gy
Plan Total Fractions Prescribed: 33
Plan Total Prescribed Dose: 66 Gy
Reference Point Dosage Given to Date: 58 Gy
Reference Point Session Dosage Given: 2 Gy
Session Number: 29

## 2023-05-23 NOTE — Progress Notes (Addendum)
Received call from cancer center. 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 Noted worsening  dysphagia and inability to tolerate po.  Needs feeding tube. On lovenox in setting of PE. Per Myrene Galas NP at the cancer center, Dr. Everlene Farrier w/ surgery ok for feeding tube placement. 1/20 Clinic VS stable.Will accept to Hospitalist Service. Tentative plan for admission for feeding tube placement. Will need to be transition to heparin perioperatively. Please contact general surgery on arrival for consult.

## 2023-05-23 NOTE — Progress Notes (Signed)
Hematology/Oncology Consult note [facility]  Telephone:([phone] [phone]  Patient Care Team: Sherlyn Hay, DO as PCP - General (Family Medicine) Debbe Odea, MD as PCP - Cardiology (Cardiology) Creig Hines, MD as Consulting Physician (Oncology) Leafy Ro, MD as Consulting Physician (General Surgery) Dasher, Cliffton Asters, MD (Dermatology) Scheeler, Kermit Balo, PA-C as Physician Assistant (Plastic Surgery) Minna Antis, MD as Attending Physician (Emergency Medicine) Alvira Philips, Leitha Bleak (Neurology) Dayna Barker, MD as Referring Physician (Internal Medicine) Coralyn Helling, MD (Inactive) as Consulting Physician (Pulmonary Disease) Gaspar Cola, RPH (Inactive) (Pharmacist) Pa, [facility] (Optometry) Glory Buff, RN as Oncology Nurse Navigator Carmina Miller, MD as Consulting Physician (Radiation Oncology) Redge Gainer, RN as [facility] Care Management   Name of the patient: Denise Watts  [id number]  03-Feb-1956   Date of visit: 05/23/23  Diagnosis-non-small cell carcinoma of unknown primary  Chief complaint/ Reason for visit-on treatment assessment prior to cycle 5 of weekly CarboTaxol chemotherapy  Heme/Onc history:  Oncology History Overview Note  1. Carcinoma of breast, T1, N0, M0 tumor diagnosis.  In January of 1997 2. Carcinoma of the vocal cord T1, N0, M0 tumor.  Status postradiation therapy 3. Abnormal CT scan of the chest (December, 2015) 4. Repeat CT scan of chest shows stable nodule (July, 2016) 5. Neutropenia with normal hemoglobin and normal platelet count (July, 2016)   Cancer of vocal cord [facility]) (Resolved)  11/10/2014 Initial Diagnosis   Cancer of vocal cord   Malignant neoplasm of lower-outer quadrant of right breast of female, estrogen receptor negative (HCC)  11/24/2017 Initial Diagnosis   Malignant neoplasm of lower-outer quadrant of right breast of female, estrogen receptor  negative (HCC)   01/12/2018 Cancer Staging   Staging form: Breast, AJCC 8th Edition - Clinical stage from 01/12/2018: Stage IB (cT1a, cN0, cM0, G3, ER+, PR-, HER2-) - Signed by Creig Hines, MD on 01/12/2018   01/30/2018 - 04/26/2018 Chemotherapy   The patient had dexamethasone (DECADRON) 4 MG tablet, 8 mg, Oral, 2 times daily, 1 of 1 cycle, Start date: 01/12/2018, End date: 08/03/2018 palonosetron (ALOXI) injection 0.25 mg, 0.25 mg, Intravenous,  Once, 4 of 4 cycles Administration: 0.25 mg (01/30/2018), 0.25 mg (02/27/2018), 0.25 mg (03/28/2018), 0.25 mg (04/26/2018) pegfilgrastim (NEULASTA ONPRO KIT) injection 6 mg, 6 mg, Subcutaneous, Once, 4 of 4 cycles Administration: 6 mg (01/30/2018), 6 mg (02/27/2018), 6 mg (03/28/2018), 6 mg (04/26/2018) cyclophosphamide (CYTOXAN) 1,000 mg in sodium chloride 0.9 % 250 mL chemo infusion, 600 mg/m2 = 1,000 mg, Intravenous,  Once, 4 of 4 cycles Administration: 1,000 mg (01/30/2018), 1,000 mg (02/27/2018), 1,000 mg (03/28/2018), 1,000 mg (04/26/2018) DOCEtaxel (TAXOTERE) 130 mg in sodium chloride 0.9 % 250 mL chemo infusion, 75 mg/m2 = 130 mg, Intravenous,  Once, 4 of 4 cycles Dose modification: 60 mg/m2 (original dose 75 mg/m2, Cycle 2, Reason: Dose not tolerated) Administration: 130 mg (01/30/2018), 100 mg (02/27/2018), 100 mg (03/28/2018), 100 mg (04/26/2018)  for chemotherapy treatment.    Non-small cell lung cancer (HCC)  03/09/2023 Initial Diagnosis   Non-small cell lung cancer (HCC)   03/20/2023 -  Chemotherapy   Patient is on Treatment Plan : LUNG Carboplatin + Paclitaxel q7d       The patient was originally diagnosed with right breast cancer in 1997, 10 mm grade 3, ER pr positive her 2 negative, node negative status post surgery and radiation treatment only. She never received chemotherapy or hormone therapy. For her vocal cord cancer, she had  extensive surgery and radiation therapy in 1998. Due to past history of smoking, she was also noted to have  lung nodules. She is undergoing active surveillance imaging study of the chest.   Patient noted to have adrenal mass on CT chest which prompted PET CT in July 2018 which showed: IMPRESSION: 1. Right adrenal mass with peripheral hypermetabolism. Given interval development since 11/18/2015, suspicious for either isolated metastasis or a primary adrenal neoplasm. This should be considered for biopsy. 2. No evidence of hypermetabolic primary malignancy or extraadrenal metastasis. Pulmonary nodules are not significantly hypermetabolic. 3. Coronary artery atherosclerosis. Aortic Atherosclerosis (ICD10-I70.0). 4. Hepatic steatosis. 5. Left sixth rib hypermetabolism is favored to be related to remote Trauma.   Patient underwent right adrenalectomy which showed: DIAGNOSIS:  A. ADRENAL GLAND, RIGHT; ADRENALECTOMY:  - HIGH GRADE CARCINOMA WITH EXTENSIVE NECROSIS.  - MARGINS OF THE SPECIMEN ARE NEGATIVE FOR CARCINOMA.   Comment:  Sections demonstrate abundant necrosis confined to the adrenal medullary  compartment. In one block of tissue a small fragment of viable malignant  neoplasm was identified. A panel of immunohistochemical stains was  performed with the following pattern of immunoreactivity:  Super pancytokeratin: Positive  GATA-3: Negative  TTF-1: Negative  P40: Negative  Napsin: Negative  PAX-8: Negative  Chromogranin: Negative     Repeat CT chest abdomen and pelvis in July 2019 showed stable left lower lobe pulmonary nodule.  No other evidence of metastatic disease.  New 4.5 mm nodule in the right breast.  This was followed by ultrasound mammogram and core biopsy of the breast lesion which was a grade 3 triple negative breast cancer.  Patient was seen by Dr. Lemar Livings and underwent lumpectomy on 12/08/2017.   Patient had a 5 mm weakly ER positive tumor.  Given her ongoing fatigue Oncotype testing was done to see if she could potentially avoid chemotherapy.  Oncotype shows a recurrence  score of 53 with her risk of distant recurrence at 9 years at greater than 39% with hormone therapy alone.  Absolute benefit of chemotherapy was greater than 15% in her age group.  Patient completed 4 cycles of adjuvant TC chemotherapy in December 2019    Patient has known bilateral pulmonary nodules which have been monitored and she was again found to have a left lower lobe lung nodule measuring 13 mm unchanged as compared to prior exams.  There were also new subcentimeter lung nodules detected and follow-up CT was recommended.  CT angio chest in August 2024 showed increase in the size of left upper lobe lung nodule and new multiple pulmonary nodules concerning for metastatic disease.  This was followed by a PET CT scan which showed hypermetabolic intrathoracic nodal adenopathy measuring 5 to 13 mm in size along with lung nodules with a maximum size measuring 13 mm with an SUV of 9.2.   CT-guided biopsy was nondiagnostic she is patient underwent bronchoscopy but lymph nodes were not sampled at that time due to equipment malfunction.Bronchial lavage from left upper lobe was positive for malignancy non-small cell carcinoma.  But no further subclassification whether this is primary lung cancer or metastatic carcinoma could be done due to limited specimen. EBUS station 7 LN also shows non small cell carcinoma      Interval history-patient is continuing to do poorly and oral intake is negligible and she is mainly sustaining herself on some chicken broth.  Reports continued difficulty swallowing food.  She is using Lovenox injections for her recent PE because she is unable to swallow Eliquis.  Radiation is ongoing  ECOG PS- 3 Pain scale- 4 Opioid associated constipation-no  Review of systems- Review of Systems  Constitutional:  Positive for malaise/fatigue and weight loss. Negative for chills and fever.  HENT:  Negative for congestion, ear discharge and nosebleeds.   Eyes:  Negative for blurred vision.   Respiratory:  Negative for cough, hemoptysis, sputum production, shortness of breath and wheezing.   Cardiovascular:  Negative for chest pain, palpitations, orthopnea and claudication.  Gastrointestinal:  Negative for abdominal pain, blood in stool, constipation, diarrhea, heartburn, melena, nausea and vomiting.  Genitourinary:  Negative for dysuria, flank pain, frequency, hematuria and urgency.  Musculoskeletal:  Positive for back pain. Negative for joint pain and myalgias.  Skin:  Negative for rash.  Neurological:  Negative for dizziness, tingling, focal weakness, seizures, weakness and headaches.  Endo/Heme/Allergies:  Does not bruise/bleed easily.  Psychiatric/Behavioral:  Negative for depression and suicidal ideas. The patient does not have insomnia.       Allergies  Allergen Reactions   Sulfa Antibiotics Other (See Comments)    Mouth sore and raw     Past Medical History:  Diagnosis Date   Adenomatous colon polyp    Adrenal carcinoma, right (HCC) 12/28/2016   a.) high grade carcinoma with extensive necrosis --> s/p adrenalectomy   Anxiety    Aortic atherosclerosis (HCC)    Arthritis of right knee    Atrial fibrillation and flutter (HCC)    a.) CHA2DS2-VASc = 3 (age, sex, vascular disease history) as of 02/13/2023; b.) cardiac rate/rhythm maintained intrinsically without pharmacological intervention; no chronic OAC (does take low dose ASA)   B12 deficiency    Breast cancer (HCC) 1997   a.) stage IB (cT1a, cN0, cM0, G3, ER+, PR-, HER2-); s/p lumpectomy + XRT   COPD (chronic obstructive pulmonary disease) (HCC)    Coronary artery disease    a.) cCTA 12/09/2021: Ca2+ = 135 (80th %'ile for age/sex/race match contol) --> 25-49% pLAD and RCA distributions   Depression    Diastolic dysfunction    a.) TTE 12/11/2020, no RWMAs, norm RVSF, G1DD; b.) TTE 12/14/2021: EF 55-60, no RWMAs, norm RVSF, G1DD; c.) TTE 01/27/2023: EF 50-55%, no RWMAs, G1DD, mild RAE, mild MR, mod TR   Dry  cough 03/02/2017   Dyspnea    Full dentures    GERD (gastroesophageal reflux disease)    History of hiatal hernia    HLD (hyperlipidemia)    Invasive carcinoma of breast (HCC) 11/15/2017   a.) path (+) for G3, ER -, PR -, HER2/neu - ; s/p lumpectomy 01/17/2018 + 4 cycles TC systemic chemotherapy + XRT   Long term current use of aspirin    Personal history of chemotherapy    Personal history of radiation therapy    Pneumothorax of left lung after biopsy 01/25/2023   a.) hypoxic in PACU requiring re-intubation --> transferred to ICU --> chest tube placed by PCCM   RLS (restless legs syndrome)    a.) on ropinirole   Skin cancer    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   a.) Stage IB (cT1a, cN0, cM0, G3, ER+, PR-, HER2-)     Past Surgical History:  Procedure Laterality Date   BIOPSY  01/05/2023   Procedure: BIOPSY;  Surgeon: Toney Reil, MD;  Location: [facility] END[facility]COPY;  Service: Gastroenterology;;   BREAST BIOPSY Right 1997   positive   BREAST BIOPSY Right 11/15/2017   INVASIVE MAMMARY CARCINOMA triple negative   BREAST BIOPSY Right 07/20/2020  [place] bx, Q marker, neg   BREAST LUMPECTOMY Right 1997   2019 also   BREAST LUMPECTOMY WITH SENTINEL LYMPH NODE BIOPSY Right 12/08/2017   Procedure: BREAST LUMPECTOMY WITH SENTINEL LYMPH NODE BX;  Surgeon: Earline Mayotte, MD;  Location: [facility] ORS;  Service: General;  Laterality: Right;   BREAST REDUCTION WITH MASTOPEXY Left 06/10/2019   Procedure: Left breast mastopexy reduction for symmetry;  Surgeon: Peggye Form, DO;  Location: [facility];  Service: Plastics;  Laterality: Left;  total 2.5 hours   CATARACT EXTRACTION W/PHACO Right 02/07/2022   Procedure: CATARACT EXTRACTION PHACO AND INTRAOCULAR LENS PLACEMENT (IOC) RIGHT DIABETIC;  Surgeon: Nevada Crane, MD;  Location: [facility];  Service: Ophthalmology;  Laterality: Right;  3.60 00:34.7   CATARACT EXTRACTION W/PHACO Left 02/28/2022    Procedure: CATARACT EXTRACTION PHACO AND INTRAOCULAR LENS PLACEMENT (IOC) LEFT DIABETIC 4.11 00:26.1;  Surgeon: Nevada Crane, MD;  Location: [facility];  Service: Ophthalmology;  Laterality: Left;   COLONOSCOPY  2015   COLONOSCOPY N/A 01/05/2023   Procedure: COLONOSCOPY;  Surgeon: Toney Reil, MD;  Location: [facility] END[facility];  Service: Gastroenterology;  Laterality: N/A;   COLONOSCOPY WITH PROPOFOL N/A 10/29/2019   Procedure: COLONOSCOPY WITH PROPOFOL;  Surgeon: Midge Minium, MD;  Location: [facility] END[facility];  Service: Endoscopy;  Laterality: N/A;   ESOPHAGOGASTRODUODENOSCOPY (EGD) WITH PROPOFOL N/A 05/24/2019   Procedure: ESOPHAGOGASTRODUODENOSCOPY (EGD) WITH PROPOFOL;  Surgeon: Midge Minium, MD;  Location: [facility] END[facility]COPY;  Service: Endoscopy;  Laterality: N/A;   ESOPHAGOGASTRODUODENOSCOPY (EGD) WITH PROPOFOL N/A 09/06/2022   Procedure: ESOPHAGOGASTRODUODENOSCOPY (EGD) WITH PROPOFOL;  Surgeon: Midge Minium, MD;  Location: [facility];  Service: Endoscopy;  Laterality: N/A;   ESOPHAGOGASTRODUODENOSCOPY (EGD) WITH PROPOFOL  01/05/2023   Procedure: ESOPHAGOGASTRODUODENOSCOPY (EGD) WITH PROPOFOL;  Surgeon: Toney Reil, MD;  Location: [facility];  Service: Gastroenterology;;   ESOPHAGUS SURGERY     IR IMAGING GUIDED PORT INSERTION  02/07/2023   KNEE SURGERY Right    LIPOSUCTION WITH LIPOFILLING Right 06/10/2019   Procedure: right breast scar and capsule release with fat grafting;  Surgeon: Peggye Form, DO;  Location: [facility];  Service: Plastics;  Laterality: Right;   POLYPECTOMY  01/05/2023   Procedure: POLYPECTOMY;  Surgeon: Toney Reil, MD;  Location: [facility] END[facility];  Service: Gastroenterology;;   [facility] REMOVAL     PORTACATH PLACEMENT Right 01/17/2018   Procedure: INSERTION PORT-A-CATH- RIGHT;  Surgeon: Earline Mayotte, MD;  Location: [facility];  Service: General;  Laterality: Right;   PULMONARY THROMBECTOMY Bilateral 05/12/2023    Procedure: PULMONARY THROMBECTOMY;  Surgeon: Annice Needy, MD;  Location: [facility];  Service: Cardiovascular;  Laterality: Bilateral;   ROBOTIC ADRENALECTOMY Right 12/28/2016   Procedure: ROBOTIC ADRENALECTOMY;  Surgeon: Vanna Scotland, MD;  Location: [facility];  Service: Urology;  Laterality: Right;   SKIN CANCER EXCISION     THROAT SURGERY  1998   throat cancer    TUBAL LIGATION     VENTRAL HERNIA REPAIR N/A 03/03/2017   Procedure: HERNIA REPAIR VENTRAL ADULT;  Surgeon: Ricarda Frame, MD;  Location: [facility];  Service: General;  Laterality: N/A;   VIDEO BRONCHOSCOPY WITH ENDOBRONCHIAL ULTRASOUND N/A 01/25/2023   Procedure: VIDEO BRONCHOSCOPY WITH ENDOBRONCHIAL ULTRASOUND;  Surgeon: Vida Rigger, MD;  Location: [facility];  Service: Thoracic;  Laterality: N/A;   VIDEO BRONCHOSCOPY WITH ENDOBRONCHIAL ULTRASOUND N/A 02/17/2023   Procedure: VIDEO BRONCHOSCOPY WITH ENDOBRONCHIAL ULTRASOUND;  Surgeon: Vida Rigger, MD;  Location: [facility];  Service:  Thoracic;  Laterality: N/A;    Social History   Socioeconomic History   Marital status: Married    Spouse name: Fatin, Carney (Spouse) [phone] ([place])   Number of children: 4   Years of education: Not on file   Highest education level: 10th grade  Occupational History   Occupation: retired  Tobacco Use   Smoking status: Former    Current packs/day: 0.00    Average packs/day: 1.5 packs/day for 30.0 years (45.0 ttl pk-yrs)    Types: Cigarettes    Start date: 05/02/1965    Quit date: 05/03/1995    Years since quitting: 28.0    Passive exposure: Past   Smokeless tobacco: Never  Vaping Use   Vaping status: Never Used  Substance and Sexual Activity   Alcohol use: Not Currently    Alcohol/week: 4.0 standard drinks of alcohol    Types: 4 Shots of liquor per week    Comment: none   Drug use: No   Sexual activity: Not Currently    Birth control/protection: Post-menopausal  Other Topics Concern   Not on file  Social  History Narrative   Not on file   Social Drivers of Health   Financial Resource Strain: Low Risk  (05/19/2023)   Overall Financial Resource Strain (CARDIA)    Difficulty of Paying Living Expenses: Not very hard  Food Insecurity: No Food Insecurity (05/15/2023)   Hunger Vital Sign    Worried About Running Out of Food in the Last Year: Never true    Ran Out of Food in the Last Year: Never true  Transportation Needs: No Transportation Needs (05/15/2023)   PRAPARE - Administrator, Civil Service (Medical): No    Lack of Transportation (Non-Medical): No  Physical Activity: Inactive (05/19/2023)   Exercise Vital Sign    Days of Exercise per Week: 0 days    Minutes of Exercise per Session: 0 min  Stress: Stress Concern Present (05/19/2023)   [employer] of Occupational Health - Occupational Stress Questionnaire    Feeling of Stress : To some extent  Social Connections: Moderately Isolated (05/15/2023)   Social Connection and Isolation Panel [NHANES]    Frequency of Communication with Friends and Family: More than three times a week    Frequency of Social Gatherings with Friends and Family: More than three times a week    Attends Religious Services: Never    Database administrator or Organizations: No    Attends Banker Meetings: Never    Marital Status: Married  Catering manager Violence: Not At Risk (05/15/2023)   Humiliation, Afraid, Rape, and Kick questionnaire    Fear of Current or Ex-Partner: No    Emotionally Abused: No    Physically Abused: No    Sexually Abused: No    Family History  Problem Relation Age of Onset   Diabetes Mother    Heart attack Mother    Cancer Sister        cancer of eyes, spread to lung, other places. died at 11   Cancer Sister 21       unknown primary, spread to bone, brain died in 80's   Cancer Paternal Aunt        unk type   Cancer Paternal Grandmother        type unk   Prostate cancer Neg Hx    Kidney cancer Neg Hx     Bladder Cancer Neg Hx    Breast cancer Neg Hx  Current Outpatient Medications:    benzonatate (TESSALON) 100 MG capsule, Take 2 capsules (200 mg total) by mouth 2 (two) times daily as needed for cough., Disp: 180 capsule, Rfl: 0   enoxaparin (LOVENOX) 60 MG/0.6ML injection, Inject 0.525 mLs (52 mg total) into the skin every 12 (twelve) hours., Disp: 32 mL, Rfl: 0   fentaNYL (DURAGESIC) 12 MCG/HR, Place 1 patch onto the skin every 3 (three) days., Disp: 10 patch, Rfl: 0   fluconazole (DIFLUCAN) 10 MG/ML suspension, TAKE 10 ML (100 MG TOTAL) BY MOUTH DAILY, Disp: 175 mL, Rfl: 0   lidocaine (LIDODERM) 5 %, Place 1 patch onto the skin daily. Remove & Discard patch within 12 hours or as directed by MD, Disp: , Rfl:    lidocaine-prilocaine (EMLA) cream, Apply to affected area once, Disp: 30 g, Rfl: 3   metoprolol succinate (TOPROL-XL) 25 MG 24 hr tablet, Take 1 tablet (25 mg total) by mouth daily., Disp: 30 tablet, Rfl: 0   omeprazole (KONVOMEP) 2 mg/mL SUSP oral suspension, Take 20 mLs (40 mg total) by mouth daily., Disp: 240 mL, Rfl: 1   ondansetron (ZOFRAN-ODT) 4 MG disintegrating tablet, Take 1 tablet (4 mg total) by mouth every 8 (eight) hours as needed for nausea or vomiting., Disp: 20 tablet, Rfl: 0   oxyCODONE (ROXICODONE) 5 MG/5ML solution, Take 5 mLs (5 mg total) by mouth every 4 (four) hours as needed for severe pain (pain score 7-10)., Disp: 200 mL, Rfl: 0   rOPINIRole (REQUIP) 0.25 MG tablet, Take by mouth., Disp: , Rfl:    rosuvastatin (CRESTOR) 40 MG tablet, TAKE 1 TABLET BY MOUTH DAILY, Disp: 90 tablet, Rfl: 1   sucralfate (CARAFATE) 1 GM/10ML suspension, Take 10 mLs (1 g total) by mouth 4 (four) times daily -  with meals and at bedtime., Disp: 420 mL, Rfl: 0 No current facility-administered medications for this visit.  Facility-Administered Medications Ordered in Other Visits:    cyanocobalamin ((VITAMIN B-12)) injection 1,000 mcg, 1,000 mcg, Intramuscular, Q30 days, Creig Hines, MD, 1,000 mcg at 08/27/21 1537   cyanocobalamin (VITAMIN B12) injection 1,000 mcg, 1,000 mcg, Intramuscular, Q30 days, Creig Hines, MD, 1,000 mcg at 03/16/22 1101  Physical exam:  Vitals:   05/22/23 1204  BP: 124/63  Pulse: 80  Resp: 19  Temp: (!) 96.9 F (36.1 C)  TempSrc: Tympanic  SpO2: 99%   Physical Exam Constitutional:      Comments: She is extremely frail appearing and sitting in a wheelchair.  Cardiovascular:     Rate and Rhythm: Normal rate and regular rhythm.     Heart sounds: Normal heart sounds.  Pulmonary:     Effort: Pulmonary effort is normal.     Breath sounds: Normal breath sounds.  Abdominal:     General: Bowel sounds are normal.     Palpations: Abdomen is soft.  Musculoskeletal:     Right lower leg: Edema present.     Left lower leg: Edema present.  Skin:    General: Skin is warm and dry.  Neurological:     Mental Status: She is alert and oriented to person, place, and time.         Latest Ref Rng & Units 05/22/2023   11:40 AM  CMP  Glucose 70 - 99 mg/dL 84   BUN 8 - 23 mg/dL 10   Creatinine 1.61 - 1.00 mg/dL 0.96   Sodium 045 - 409 mmol/L 134   Potassium 3.5 - 5.1 mmol/L 3.3  Chloride 98 - 111 mmol/L 99   CO2 22 - 32 mmol/L 25   Calcium 8.9 - 10.3 mg/dL 8.7   Total Protein 6.5 - 8.1 g/dL 5.9   Total Bilirubin 0.0 - 1.2 mg/dL 0.7   Alkaline Phos 38 - 126 U/L 60   AST 15 - 41 U/L 23   ALT 0 - 44 U/L 12       Latest Ref Rng & Units 05/22/2023   11:40 AM  CBC  WBC 4.0 - 10.5 K/uL 3.8   Hemoglobin 12.0 - 15.0 g/dL 21.3   Hematocrit 08.6 - 46.0 % 31.5   Platelets 150 - 400 K/uL 255     No images are attached to the encounter.  PERIPHERAL VASCULAR CATHETERIZATION Result Date: 05/12/2023 See surgical note for result.  ECHOCARDIOGRAM COMPLETE Result Date: 05/12/2023    ECHOCARDIOGRAM REPORT   Patient Name:   OLEVA KIPP Date of Exam: 05/11/2023 Medical Rec #:  [id number]    Height:       61.0 in Accession #:    [id number]    Weight:       114.6 lb Date of Birth:  08/29/1955    BSA:          1.491 m Patient Age:    67 years     BP:           115/67 mmHg Patient Gender: F            HR:           92 bpm. Exam Location:  [facility] Procedure: 2D Echo, Cardiac Doppler and Color Doppler Indications:     Pulmonary embolus  History:         Patient has prior history of Echocardiogram examinations, most                  recent 01/27/2023. CHF, CAD, COPD, Arrythmias:Atrial                  Fibrillation and Atrial Flutter; Risk Factors:Dyslipidemia.                  Breast CA, CKD.  Sonographer:     Mikki Harbor Referring Phys:  [id number] Lorretta Harp Diagnosing Phys: Julien Nordmann MD  Sonographer Comments: Technically difficult study due to poor echo windows. Image acquisition challenging due to respiratory motion. IMPRESSIONS  1. Left ventricular ejection fraction, by estimation, is 60 to 65%. The left ventricle has normal function. The left ventricle has no regional wall motion abnormalities. There is mild left ventricular hypertrophy. Left ventricular diastolic parameters are consistent with Grade II diastolic dysfunction (pseudonormalization).  2. Right ventricular systolic function is normal. The right ventricular size is mildly enlarged. There is normal pulmonary artery systolic pressure. The estimated right ventricular systolic pressure is 29.9 mmHg.  3. The mitral valve is normal in structure. Mild mitral valve regurgitation. No evidence of mitral stenosis.  4. Tricuspid valve regurgitation is mild to moderate.  5. The aortic valve is normal in structure. Aortic valve regurgitation is not visualized. No aortic stenosis is present.  6. The inferior vena cava is normal in size with greater than 50% respiratory variability, suggesting right atrial pressure of 3 mmHg. FINDINGS  Left Ventricle: Left ventricular ejection fraction, by estimation, is 60 to 65%. The left ventricle has normal function. The left ventricle has no regional wall motion  abnormalities. The left ventricular internal cavity size was normal in size. There is  mild left ventricular  hypertrophy. Left ventricular diastolic parameters are consistent with Grade II diastolic dysfunction (pseudonormalization). Right Ventricle: The right ventricular size is mildly enlarged. No increase in right ventricular wall thickness. Right ventricular systolic function is normal. There is normal pulmonary artery systolic pressure. The tricuspid regurgitant velocity is 2.34  m/s, and with an assumed right atrial pressure of 8 mmHg, the estimated right ventricular systolic pressure is 29.9 mmHg. Left Atrium: Left atrial size was normal in size. Right Atrium: Right atrial size was normal in size. Pericardium: There is no evidence of pericardial effusion. Mitral Valve: The mitral valve is normal in structure. Mild mitral valve regurgitation. No evidence of mitral valve stenosis. MV peak gradient, 3.3 mmHg. The mean mitral valve gradient is 1.0 mmHg. Tricuspid Valve: The tricuspid valve is normal in structure. Tricuspid valve regurgitation is mild to moderate. No evidence of tricuspid stenosis. Aortic Valve: The aortic valve is normal in structure. Aortic valve regurgitation is not visualized. No aortic stenosis is present. Aortic valve mean gradient measures 2.0 mmHg. Aortic valve peak gradient measures 3.9 mmHg. Aortic valve area, by VTI measures 2.52 cm. Pulmonic Valve: The pulmonic valve was normal in structure. Pulmonic valve regurgitation is not visualized. No evidence of pulmonic stenosis. Aorta: The aortic root is normal in size and structure. Venous: The inferior vena cava is normal in size with greater than 50% respiratory variability, suggesting right atrial pressure of 3 mmHg. IAS/Shunts: No atrial level shunt detected by color flow Doppler.  LEFT VENTRICLE PLAX 2D LVIDd:         3.40 cm   Diastology LVIDs:         2.10 cm   LV e' medial:    6.64 cm/s LV PW:         1.00 cm   LV E/e' medial:  11.4  LV IVS:        1.20 cm   LV e' lateral:   7.51 cm/s LVOT diam:     1.80 cm   LV E/e' lateral: 10.1 LV SV:         50 LV SV Index:   33 LVOT Area:     2.54 cm  RIGHT VENTRICLE RV Basal diam:  3.50 cm RV Mid diam:    3.80 cm RV S prime:     12.90 cm/s TAPSE (M-mode): 1.9 cm LEFT ATRIUM             Index        RIGHT ATRIUM           Index LA diam:        3.00 cm 2.01 cm/m   RA Area:     14.00 cm LA Vol (A2C):   34.8 ml 23.34 ml/m  RA Volume:   34.70 ml  23.27 ml/m LA Vol (A4C):   29.2 ml 19.59 ml/m LA Biplane Vol: 31.9 ml 21.40 ml/m  AORTIC VALVE                    PULMONIC VALVE AV Area (Vmax):    2.25 cm     PV Vmax:       1.46 m/s AV Area (Vmean):   2.25 cm     PV Peak grad:  8.5 mmHg AV Area (VTI):     2.52 cm AV Vmax:           98.70 cm/s AV Vmean:          63.800 cm/s AV VTI:  0.197 m AV Peak Grad:      3.9 mmHg AV Mean Grad:      2.0 mmHg LVOT Vmax:         87.10 cm/s LVOT Vmean:        56.300 cm/s LVOT VTI:          0.195 m LVOT/AV VTI ratio: 0.99  AORTA Ao Root diam: 2.30 cm MITRAL VALVE               TRICUSPID VALVE MV Area (PHT): 4.86 cm    TR Peak grad:   21.9 mmHg MV Area VTI:   2.85 cm    TR Vmax:        234.00 cm/s MV Peak grad:  3.3 mmHg MV Mean grad:  1.0 mmHg    SHUNTS MV Vmax:       0.90 m/s    Systemic VTI:  0.20 m MV Vmean:      51.5 cm/s   Systemic Diam: 1.80 cm MV Decel Time: 156 msec MV E velocity: 75.90 cm/s MV A velocity: 76.50 cm/s MV E/A ratio:  0.99 Julien Nordmann MD Electronically signed by Julien Nordmann MD Signature Date/Time: 05/12/2023/8:36:33 AM    Final    CT Angio Chest PE W and/or Wo Contrast Result Date: 05/10/2023 CLINICAL DATA:  68 year old female with history of lung cancer status post radiation chemotherapy presenting for concerns of increasing swelling in legs. EXAM: CT ANGIOGRAPHY CHEST WITH CONTRAST TECHNIQUE: Multidetector CT imaging of the chest was performed using the standard protocol during bolus administration of intravenous contrast.  Multiplanar CT image reconstructions and MIPs were obtained to evaluate the vascular anatomy. RADIATION DOSE REDUCTION: This exam was performed according to the departmental dose-optimization program which includes automated exposure control, adjustment of the mA and/or kV according to patient size and/or use of iterative reconstruction technique. CONTRAST:  36mL OMNIPAQUE IOHEXOL 350 MG/ML SOLN COMPARISON:  Same day chest radiograph and CT chest 01/23/2023 FINDINGS: Cardiovascular: Segmental and subsegmental pulmonary emboli in the right lower lobe pulmonary arteries. There is evidence of right heart strain with RV-LV ratio of 1.2 and mild flattening of the interventricular septum. No pericardial effusion. Crescentic filling defect in the lower thoracic descending aorta extending into the upper abdominal aorta terminating at the level of the celiac axis. This is not definitively seen on prior imaging and may represent a thrombosed dissection. The aorta in the area of thrombosed dissection measures 2.8 x 2.5 cm and the true lumen measures 2.1 cm. Aortic atherosclerotic calcification. Mediastinum/Nodes: Trachea and esophagus are unremarkable. No thoracic adenopathy Lungs/Pleura: Emphysema. Diffuse bronchial wall thickening. No focal consolidation, pleural effusion, or pneumothorax. Multiple pulmonary nodules are redemonstrated. The largest nodules in the left upper lobe on series 7/image 106 and measures 16 by 10 mm, previously 14 x 13 mm. The largest nodule in the left lower lobe measures 11 x 8 mm, previously 11 x 9 mm. Ground-glass nodules along the right minor fissure measure 10 and 5 mm, respectively Upper Abdomen: No acute abnormality. Musculoskeletal: No acute fracture. Remote pathologic right ninth posterior rib fracture. Review of the MIP images confirms the above findings. IMPRESSION: 1. Segmental and subsegmental pulmonary emboli in the right lower lobe pulmonary arteries. Positive for acute PE with CT  evidence of right heart strain (RV/LV Ratio = 1.2) consistent with at least submassive (intermediate risk) PE. The presence of right heart strain has been associated with an increased risk of morbidity and mortality. Please refer to the "Code PE Focused"  order set in EPIC. 2. Crescentic filling defect in the lower thoracic descending aorta extending into the upper abdominal aorta is not definitively seen on prior imaging and may represent a thrombosed dissection. 3. Similar appearance of multiple pulmonary nodules. Aortic Atherosclerosis (ICD10-I70.0) and Emphysema (ICD10-J43.9). Critical Value/emergent results were called by telephone at the time of interpretation on 05/10/2023 at 7:41pm to provider PA Guerry Minors, who verbally acknowledged these results. Electronically Signed   By: Minerva Fester M.D.   On: 05/10/2023 19:54   US Venous Img Lower Bilateral Result Date: 05/10/2023 CLINICAL DATA:  Bilateral lower extremity edema EXAM: BILATERAL LOWER EXTREMITY VENOUS DOPPLER ULTRASOUND TECHNIQUE: Gray-scale sonography with graded compression, as well as color Doppler and duplex ultrasound were performed to evaluate the lower extremity deep venous systems from the level of the common femoral vein and including the common femoral, femoral, profunda femoral, popliteal and calf veins including the posterior tibial, peroneal and gastrocnemius veins when visible. The superficial great saphenous vein was also interrogated. Spectral Doppler was utilized to evaluate flow at rest and with distal augmentation maneuvers in the common femoral, femoral and popliteal veins. COMPARISON:  None Available. FINDINGS: RIGHT LOWER EXTREMITY Common Femoral Vein: No evidence of thrombus. Normal compressibility, respiratory phasicity and response to augmentation. Saphenofemoral Junction: No evidence of thrombus. Normal compressibility and flow on color Doppler imaging. Profunda Femoral Vein: No evidence of thrombus. Normal  compressibility and flow on color Doppler imaging. Femoral Vein: No evidence of thrombus. Normal compressibility, respiratory phasicity and response to augmentation. Popliteal Vein: No evidence of thrombus. Normal compressibility, respiratory phasicity and response to augmentation. Calf Veins: No evidence of thrombus. Normal compressibility and flow on color Doppler imaging. Superficial Great Saphenous Vein: No evidence of thrombus. Normal compressibility. Venous Reflux:  None. Other Findings:  None. LEFT LOWER EXTREMITY Common Femoral Vein: No evidence of thrombus. Normal compressibility, respiratory phasicity and response to augmentation. Saphenofemoral Junction: No evidence of thrombus. Normal compressibility and flow on color Doppler imaging. Profunda Femoral Vein: No evidence of thrombus. Normal compressibility and flow on color Doppler imaging. Femoral Vein: No evidence of thrombus. Normal compressibility, respiratory phasicity and response to augmentation. Popliteal Vein: No evidence of thrombus. Normal compressibility, respiratory phasicity and response to augmentation. Calf Veins: No evidence of thrombus. Normal compressibility and flow on color Doppler imaging. Superficial Great Saphenous Vein: No evidence of thrombus. Normal compressibility. Venous Reflux:  None. Other Findings:  None. IMPRESSION: No evidence of deep venous thrombosis in either lower extremity. Electronically Signed   By: Malachy Moan M.D.   On: 05/10/2023 16:40   DG Chest 2 View Result Date: 05/10/2023 CLINICAL DATA:  Shortness of breath. EXAM: CHEST - 2 VIEW COMPARISON:  Chest radiograph from 02/17/2023. FINDINGS: There are several nodules overlying the bilateral lung fields, marked with electronic arrow sign, increased in number since the prior study. Findings are concerning for metastatic disease. Further evaluation with nonemergent chest CT scan is recommended. Bilateral lung fields are otherwise clear. No acute consolidation  or lung collapse. Bilateral costophrenic angles are clear. Normal cardio-mediastinal silhouette. No acute osseous abnormalities. The soft tissues are within normal limits. Right-sided CT Port-A-Cath is again seen with its tip overlying the midportion of superior vena cava. IMPRESSION: *There are several bilateral lung nodules, increased in number since the prior study, concerning for worsening metastatic disease. Further evaluation with nonemergent chest CT scan is recommended. Electronically Signed   By: Jules Schick M.D.   On: 05/10/2023 16:18   DG Tibia/Fibula Left Result Date: 05/06/2023  CLINICAL DATA:  Blunt trauma to the lower left leg, initial encounter EXAM: LEFT TIBIA AND FIBULA - 2 VIEW COMPARISON:  None Available. FINDINGS: Mild soft tissue swelling is noted. No acute fracture or dislocation is seen. IMPRESSION: Swelling without acute bony abnormality. Electronically Signed   By: Alcide Clever M.D.   On: 05/06/2023 23:18   US Venous Img Lower Unilateral Left Result Date: 05/04/2023 CLINICAL DATA:  Left lower extremity edema. EXAM: Left LOWER EXTREMITY VENOUS DOPPLER ULTRASOUND TECHNIQUE: Gray-scale sonography with graded compression, as well as color Doppler and duplex ultrasound were performed to evaluate the lower extremity deep venous systems from the level of the common femoral vein and including the common femoral, femoral, profunda femoral, popliteal and calf veins including the posterior tibial, peroneal and gastrocnemius veins when visible. The superficial great saphenous vein was also interrogated. Spectral Doppler was utilized to evaluate flow at rest and with distal augmentation maneuvers in the common femoral, femoral and popliteal veins. COMPARISON:  None Available. FINDINGS: Contralateral Common Femoral Vein: Respiratory phasicity is normal and symmetric with the symptomatic side. No evidence of thrombus. Normal compressibility. Common Femoral Vein: No evidence of thrombus. Normal  compressibility, respiratory phasicity and response to augmentation. Saphenofemoral Junction: No evidence of thrombus. Normal compressibility and flow on color Doppler imaging. Profunda Femoral Vein: No evidence of thrombus. Normal compressibility and flow on color Doppler imaging. Femoral Vein: No evidence of thrombus. Normal compressibility, respiratory phasicity and response to augmentation. Popliteal Vein: No evidence of thrombus. Normal compressibility, respiratory phasicity and response to augmentation. Calf Veins: Poor compression along the posterior tibial vein and poor flow on Doppler. Superficial Great Saphenous Vein: No evidence of thrombus. Normal compressibility. Venous Reflux:  None. Other Findings: Small fluid collection along the popliteal fossa measuring 2.2 x 1.3 x 0.7 cm. IMPRESSION: Small below-the-knee thrombus along the posterior tibial vein. No evidence of above the knee DVT. Small fluid collection in the popliteal fossa measuring 2.2 x 0.7 x 1.3 cm. Baker's cysts is possible but there is differential. Please correlate with any known history or additional evaluation as clinically appropriate Electronically Signed   By: Karen Kays M.D.   On: 05/04/2023 12:07     Assessment and plan- Patient is a 68 y.o. female with history of head and neck, breast cancer as well as adrenal carcinoma of unknown primary in the past now with non-small cell carcinoma of unknown primary predominantly with mediastinal adenopathy. She is being treated as possible non-small cell lung cancer.  She is here for on treatment assessment prior to cycle 5 of weekly CarboTaxol chemotherapy  I am continuing to hold chemotherapy at this time and she will go on to complete radiation treatment.  She has extremely frail for need to continue chemotherapy at this time.  Severe protein calorie malnutrition: Since starting radiation treatment patient reports difficulty swallowing which has not improved despite omeprazole and  sucralfate.  She could not stand and get a weight today.  Oral intake is negligible.  I had previously discussed feeding tube with her and she was agreeable and had discussed her case with Dr. Everlene Farrier as well who was willing to place a feeding tube but given that she is currently on Lovenox I would like to get this done as an inpatient to minimize anticoagulation interruption.  Patient will stop Lovenox 24 hours prior to planned PEG tube placement and get admitted to the hospital.  She will be started on heparin drip the day before procedure and heparin drip needs  to be held 6 hours prior to planned feeding tube placement.  Lovenox can be restarted after the procedure.  Patient is also to risk of refeeding syndrome and may need to be hospitalized for a couple of days until her PEG tube feeds and labs optimize.  I will see her back in 3 weeks with labs   Visit Diagnosis 1. Non-small cell cancer of left lung (HCC)   2. Severe protein-calorie malnutrition (HCC)      Dr. Owens Shark, MD, MPH [facility] [phone] 05/23/2023 4:11 PM

## 2023-05-24 ENCOUNTER — Inpatient Hospital Stay: Admission: AD | Admit: 2023-05-24 | Payer: Medicare HMO | Source: Ambulatory Visit | Admitting: Surgery

## 2023-05-24 ENCOUNTER — Other Ambulatory Visit: Payer: Self-pay

## 2023-05-24 ENCOUNTER — Encounter: Payer: Self-pay | Admitting: Internal Medicine

## 2023-05-24 ENCOUNTER — Ambulatory Visit
Admission: RE | Admit: 2023-05-24 | Discharge: 2023-05-24 | Disposition: A | Discharge status: Home / Self Care | Payer: Medicare HMO | Source: Ambulatory Visit | Attending: Radiation Oncology | Admitting: Radiation Oncology

## 2023-05-24 ENCOUNTER — Inpatient Hospital Stay
Admission: RE | Admit: 2023-05-24 | Discharge: 2023-06-05 | DRG: 391 | Disposition: A | Discharge status: Skilled Nursing Facility | Payer: Medicare HMO | Source: Ambulatory Visit | Attending: Internal Medicine | Admitting: Internal Medicine

## 2023-05-24 DIAGNOSIS — Y842 Radiological procedure and radiotherapy as the cause of abnormal reaction of the patient, or of later complication, without mention of misadventure at the time of the procedure: Secondary | ICD-10-CM | POA: Diagnosis present

## 2023-05-24 DIAGNOSIS — I5032 Chronic diastolic (congestive) heart failure: Secondary | ICD-10-CM | POA: Diagnosis present

## 2023-05-24 DIAGNOSIS — Z87891 Personal history of nicotine dependence: Secondary | ICD-10-CM | POA: Diagnosis not present

## 2023-05-24 DIAGNOSIS — J439 Emphysema, unspecified: Secondary | ICD-10-CM | POA: Diagnosis not present

## 2023-05-24 DIAGNOSIS — J449 Chronic obstructive pulmonary disease, unspecified: Secondary | ICD-10-CM | POA: Diagnosis present

## 2023-05-24 DIAGNOSIS — R54 Age-related physical debility: Secondary | ICD-10-CM | POA: Diagnosis present

## 2023-05-24 DIAGNOSIS — Z79891 Long term (current) use of opiate analgesic: Secondary | ICD-10-CM | POA: Diagnosis not present

## 2023-05-24 DIAGNOSIS — Z6821 Body mass index (BMI) 21.0-21.9, adult: Secondary | ICD-10-CM | POA: Diagnosis not present

## 2023-05-24 DIAGNOSIS — Z860101 Personal history of adenomatous and serrated colon polyps: Secondary | ICD-10-CM

## 2023-05-24 DIAGNOSIS — Z8701 Personal history of pneumonia (recurrent): Secondary | ICD-10-CM

## 2023-05-24 DIAGNOSIS — Z7901 Long term (current) use of anticoagulants: Secondary | ICD-10-CM | POA: Diagnosis not present

## 2023-05-24 DIAGNOSIS — R1310 Dysphagia, unspecified: Secondary | ICD-10-CM | POA: Diagnosis not present

## 2023-05-24 DIAGNOSIS — R059 Cough, unspecified: Secondary | ICD-10-CM | POA: Diagnosis not present

## 2023-05-24 DIAGNOSIS — C3490 Malignant neoplasm of unspecified part of unspecified bronchus or lung: Secondary | ICD-10-CM | POA: Diagnosis not present

## 2023-05-24 DIAGNOSIS — E860 Dehydration: Secondary | ICD-10-CM | POA: Diagnosis not present

## 2023-05-24 DIAGNOSIS — F32A Depression, unspecified: Secondary | ICD-10-CM | POA: Diagnosis present

## 2023-05-24 DIAGNOSIS — J984 Other disorders of lung: Secondary | ICD-10-CM | POA: Diagnosis not present

## 2023-05-24 DIAGNOSIS — R918 Other nonspecific abnormal finding of lung field: Secondary | ICD-10-CM | POA: Diagnosis not present

## 2023-05-24 DIAGNOSIS — G893 Neoplasm related pain (acute) (chronic): Secondary | ICD-10-CM | POA: Diagnosis present

## 2023-05-24 DIAGNOSIS — I2510 Atherosclerotic heart disease of native coronary artery without angina pectoris: Secondary | ICD-10-CM | POA: Diagnosis present

## 2023-05-24 DIAGNOSIS — I25118 Atherosclerotic heart disease of native coronary artery with other forms of angina pectoris: Secondary | ICD-10-CM

## 2023-05-24 DIAGNOSIS — Z7982 Long term (current) use of aspirin: Secondary | ICD-10-CM | POA: Diagnosis not present

## 2023-05-24 DIAGNOSIS — F418 Other specified anxiety disorders: Secondary | ICD-10-CM | POA: Diagnosis not present

## 2023-05-24 DIAGNOSIS — Z79899 Other long term (current) drug therapy: Secondary | ICD-10-CM

## 2023-05-24 DIAGNOSIS — R627 Adult failure to thrive: Secondary | ICD-10-CM | POA: Diagnosis present

## 2023-05-24 DIAGNOSIS — R52 Pain, unspecified: Secondary | ICD-10-CM | POA: Diagnosis not present

## 2023-05-24 DIAGNOSIS — C3492 Malignant neoplasm of unspecified part of left bronchus or lung: Secondary | ICD-10-CM | POA: Diagnosis not present

## 2023-05-24 DIAGNOSIS — E43 Unspecified severe protein-calorie malnutrition: Secondary | ICD-10-CM | POA: Diagnosis present

## 2023-05-24 DIAGNOSIS — Z86718 Personal history of other venous thrombosis and embolism: Secondary | ICD-10-CM

## 2023-05-24 DIAGNOSIS — C3412 Malignant neoplasm of upper lobe, left bronchus or lung: Secondary | ICD-10-CM | POA: Diagnosis not present

## 2023-05-24 DIAGNOSIS — C799 Secondary malignant neoplasm of unspecified site: Secondary | ICD-10-CM | POA: Diagnosis not present

## 2023-05-24 DIAGNOSIS — Z8521 Personal history of malignant neoplasm of larynx: Secondary | ICD-10-CM

## 2023-05-24 DIAGNOSIS — Z1722 Progesterone receptor negative status: Secondary | ICD-10-CM

## 2023-05-24 DIAGNOSIS — E785 Hyperlipidemia, unspecified: Secondary | ICD-10-CM | POA: Diagnosis not present

## 2023-05-24 DIAGNOSIS — Z9221 Personal history of antineoplastic chemotherapy: Secondary | ICD-10-CM

## 2023-05-24 DIAGNOSIS — K219 Gastro-esophageal reflux disease without esophagitis: Secondary | ICD-10-CM | POA: Diagnosis present

## 2023-05-24 DIAGNOSIS — K222 Esophageal obstruction: Secondary | ICD-10-CM | POA: Diagnosis not present

## 2023-05-24 DIAGNOSIS — Z853 Personal history of malignant neoplasm of breast: Secondary | ICD-10-CM

## 2023-05-24 DIAGNOSIS — Z4682 Encounter for fitting and adjustment of non-vascular catheter: Secondary | ICD-10-CM | POA: Diagnosis not present

## 2023-05-24 DIAGNOSIS — Z86711 Personal history of pulmonary embolism: Secondary | ICD-10-CM

## 2023-05-24 DIAGNOSIS — Z1732 Human epidermal growth factor receptor 2 negative status: Secondary | ICD-10-CM

## 2023-05-24 DIAGNOSIS — E440 Moderate protein-calorie malnutrition: Secondary | ICD-10-CM | POA: Insufficient documentation

## 2023-05-24 DIAGNOSIS — Z7960 Long term (current) use of unspecified immunomodulators and immunosuppressants: Secondary | ICD-10-CM | POA: Diagnosis not present

## 2023-05-24 DIAGNOSIS — K567 Ileus, unspecified: Secondary | ICD-10-CM | POA: Diagnosis not present

## 2023-05-24 DIAGNOSIS — Z8249 Family history of ischemic heart disease and other diseases of the circulatory system: Secondary | ICD-10-CM

## 2023-05-24 DIAGNOSIS — R0602 Shortness of breath: Secondary | ICD-10-CM | POA: Diagnosis not present

## 2023-05-24 DIAGNOSIS — Z833 Family history of diabetes mellitus: Secondary | ICD-10-CM

## 2023-05-24 DIAGNOSIS — G2581 Restless legs syndrome: Secondary | ICD-10-CM | POA: Diagnosis present

## 2023-05-24 DIAGNOSIS — R109 Unspecified abdominal pain: Secondary | ICD-10-CM | POA: Diagnosis not present

## 2023-05-24 DIAGNOSIS — Z170 Estrogen receptor positive status [ER+]: Secondary | ICD-10-CM

## 2023-05-24 DIAGNOSIS — Z923 Personal history of irradiation: Secondary | ICD-10-CM

## 2023-05-24 DIAGNOSIS — Z931 Gastrostomy status: Secondary | ICD-10-CM | POA: Diagnosis not present

## 2023-05-24 DIAGNOSIS — C320 Malignant neoplasm of glottis: Secondary | ICD-10-CM | POA: Diagnosis not present

## 2023-05-24 DIAGNOSIS — K5730 Diverticulosis of large intestine without perforation or abscess without bleeding: Secondary | ICD-10-CM | POA: Diagnosis not present

## 2023-05-24 DIAGNOSIS — Z85828 Personal history of other malignant neoplasm of skin: Secondary | ICD-10-CM

## 2023-05-24 DIAGNOSIS — I480 Paroxysmal atrial fibrillation: Secondary | ICD-10-CM | POA: Diagnosis not present

## 2023-05-24 DIAGNOSIS — I82409 Acute embolism and thrombosis of unspecified deep veins of unspecified lower extremity: Secondary | ICD-10-CM | POA: Diagnosis present

## 2023-05-24 DIAGNOSIS — F419 Anxiety disorder, unspecified: Secondary | ICD-10-CM | POA: Diagnosis present

## 2023-05-24 DIAGNOSIS — E46 Unspecified protein-calorie malnutrition: Secondary | ICD-10-CM | POA: Diagnosis not present

## 2023-05-24 DIAGNOSIS — Z510 Encounter for antineoplastic radiation therapy: Secondary | ICD-10-CM | POA: Diagnosis not present

## 2023-05-24 DIAGNOSIS — J441 Chronic obstructive pulmonary disease with (acute) exacerbation: Secondary | ICD-10-CM | POA: Diagnosis present

## 2023-05-24 DIAGNOSIS — R140 Abdominal distension (gaseous): Secondary | ICD-10-CM | POA: Diagnosis not present

## 2023-05-24 DIAGNOSIS — E876 Hypokalemia: Secondary | ICD-10-CM | POA: Diagnosis present

## 2023-05-24 DIAGNOSIS — C328 Malignant neoplasm of overlapping sites of larynx: Secondary | ICD-10-CM | POA: Diagnosis not present

## 2023-05-24 DIAGNOSIS — Z85858 Personal history of malignant neoplasm of other endocrine glands: Secondary | ICD-10-CM

## 2023-05-24 LAB — BASIC METABOLIC PANEL
Anion gap: 13 (ref 5–15)
BUN: 8 mg/dL (ref 8–23)
CO2: 27 mmol/L (ref 22–32)
Calcium: 9 mg/dL (ref 8.9–10.3)
Chloride: 98 mmol/L (ref 98–111)
Creatinine, Ser: 0.69 mg/dL (ref 0.44–1.00)
GFR, Estimated: >60 mL/min (ref >60)
Glucose, Bld: 100 mg/dL — ABNORMAL HIGH (ref 70–99)
Potassium: 4 mmol/L (ref 3.5–5.1)
Sodium: 138 mmol/L (ref 135–145)

## 2023-05-24 LAB — RAD ONC ARIA SESSION SUMMARY
Course Elapsed Days: 63
Plan Fractions Treated to Date: 30
Plan Prescribed Dose Per Fraction: 2 Gy
Plan Total Fractions Prescribed: 33
Plan Total Prescribed Dose: 66 Gy
Reference Point Dosage Given to Date: 60 Gy
Reference Point Session Dosage Given: 2 Gy
Session Number: 30

## 2023-05-24 LAB — CBC
HCT: 35 % — ABNORMAL LOW (ref 36.0–46.0)
Hemoglobin: 11.1 g/dL — ABNORMAL LOW (ref 12.0–15.0)
MCH: 32.9 pg (ref 26.0–34.0)
MCHC: 31.7 g/dL (ref 30.0–36.0)
MCV: 103.9 fL — ABNORMAL HIGH (ref 80.0–100.0)
Platelets: 248 10*3/uL (ref 150–400)
RBC: 3.37 MIL/uL — ABNORMAL LOW (ref 3.87–5.11)
RDW: 15.7 % — ABNORMAL HIGH (ref 11.5–15.5)
WBC: 4.2 10*3/uL (ref 4.0–10.5)
nRBC: 0 % (ref 0.0–0.2)

## 2023-05-24 LAB — APTT: aPTT: 32 s (ref 24–36)

## 2023-05-24 LAB — PROTIME-INR
INR: 1.1 (ref 0.8–1.2)
Prothrombin Time: 14.5 s (ref 11.4–15.2)

## 2023-05-24 LAB — BRAIN NATRIURETIC PEPTIDE: B Natriuretic Peptide: 73.7 pg/mL (ref 0.0–100.0)

## 2023-05-24 LAB — TYPE AND SCREEN
ABO/RH(D): O NEG
Antibody Screen: NEGATIVE

## 2023-05-24 LAB — PHOSPHORUS: Phosphorus: 3.3 mg/dL (ref 2.5–4.6)

## 2023-05-24 LAB — MAGNESIUM: Magnesium: 2.2 mg/dL (ref 1.7–2.4)

## 2023-05-24 MED ORDER — SODIUM CHLORIDE 0.9 % IV SOLN: INTRAVENOUS | Status: AC

## 2023-05-24 MED ORDER — OXYCODONE HCL 5 MG/5ML PO SOLN
7.5000 mg | ORAL | Status: DC | PRN
  Administered 2023-05-24 – 2023-05-27 (×9): 7.5 mg via ORAL
  Filled 2023-05-24 (×9): qty 10

## 2023-05-24 MED ORDER — ACETAMINOPHEN 160 MG/5ML PO SOLN
1000.0000 mg | Freq: Four times a day (QID) | ORAL | Status: DC
  Filled 2023-05-24 (×6): qty 40.6

## 2023-05-24 MED ORDER — ONDANSETRON HCL 4 MG/2ML IJ SOLN
4.0000 mg | Freq: Three times a day (TID) | INTRAMUSCULAR | Status: DC | PRN
  Administered 2023-05-25 – 2023-05-26 (×3): 4 mg via INTRAVENOUS
  Filled 2023-05-24: qty 2

## 2023-05-24 MED ORDER — CHLORHEXIDINE GLUCONATE CLOTH 2 % EX PADS
6.0000 | MEDICATED_PAD | Freq: Every day | CUTANEOUS | Status: DC
  Administered 2023-05-25 – 2023-06-05 (×12): 6 via TOPICAL

## 2023-05-24 MED ORDER — ALBUTEROL SULFATE (2.5 MG/3ML) 0.083% IN NEBU: 3.0000 mL | INHALATION_SOLUTION | RESPIRATORY_TRACT | Status: DC | PRN

## 2023-05-24 MED ORDER — SODIUM CHLORIDE 0.9 % IV SOLN: INTRAVENOUS | Status: DC

## 2023-05-24 MED ORDER — DM-GUAIFENESIN ER 30-600 MG PO TB12
1.0000 | ORAL_TABLET | Freq: Two times a day (BID) | ORAL | Status: DC | PRN
  Administered 2023-05-25 – 2023-05-31 (×3): 1 via ORAL
  Filled 2023-05-24 (×4): qty 1

## 2023-05-24 MED ORDER — SODIUM CHLORIDE 0.9 % IV SOLN
2.0000 g | Freq: Once | INTRAVENOUS | Status: AC
  Administered 2023-05-25: 2 g via INTRAVENOUS
  Filled 2023-05-24: qty 2

## 2023-05-24 MED ORDER — HEPARIN (PORCINE) 25000 UT/250ML-% IV SOLN
550.0000 [IU]/h | INTRAVENOUS | Status: AC
  Administered 2023-05-24: 550 [IU]/h via INTRAVENOUS
  Filled 2023-05-24: qty 250

## 2023-05-24 MED ORDER — ACETAMINOPHEN 650 MG RE SUPP: 650.0000 mg | Freq: Four times a day (QID) | RECTAL | Status: DC | PRN

## 2023-05-24 MED ORDER — MORPHINE SULFATE (PF) 2 MG/ML IV SOLN
2.0000 mg | INTRAVENOUS | Status: DC | PRN
  Administered 2023-05-24 – 2023-05-27 (×11): 2 mg via INTRAVENOUS
  Filled 2023-05-24 (×12): qty 1

## 2023-05-24 MED ORDER — OXYCODONE HCL 5 MG PO TABS: 5.0000 mg | ORAL_TABLET | Freq: Four times a day (QID) | ORAL | Status: DC | PRN

## 2023-05-24 MED ORDER — SODIUM CHLORIDE 0.9% FLUSH
10.0000 mL | Freq: Two times a day (BID) | INTRAVENOUS | Status: DC
  Administered 2023-05-24: 20 mL
  Administered 2023-05-25: 10 mL
  Administered 2023-05-25: 20 mL
  Administered 2023-05-26: 10 mL
  Administered 2023-05-26: 20 mL
  Administered 2023-05-27 – 2023-06-05 (×20): 10 mL

## 2023-05-24 MED ORDER — HYDRALAZINE HCL 20 MG/ML IJ SOLN: 5.0000 mg | INTRAMUSCULAR | Status: DC | PRN

## 2023-05-24 MED ORDER — POTASSIUM CHLORIDE 10 MEQ/100ML IV SOLN: 10.0000 meq | INTRAVENOUS | Status: DC

## 2023-05-24 NOTE — H&P (Signed)
History and Physical    Denise Watts:[id number] DOB: Apr 08, 1956 DOA: 05/24/2023  Referring MD/NP/PA:   PCP: Sherlyn Hay, DO   Patient coming from:  The patient is coming from home.     Chief Complaint: dysphagia  HPI: Denise Watts is a 68 y.o. female with medical history significant of   Received call from cancer center. 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 Noted worsening  dysphagia and inability to tolerate po.  Needs feeding tube. On lovenox in setting of PE. Per Myrene Galas NP at the cancer center, Dr. Everlene Farrier w/ surgery ok for feeding tube placement. 1/20 Clinic VS stable.Will accept to Hospitalist Service. Tentative plan for admission for feeding tube placement. Will need to be transition to heparin perioperatively. Please contact general surgery on arrival for consult.        Data reviewed independently and ED Course: pt was found to have     ***       EKG: I have personally reviewed.  Not done in ED, will get one.   ***   Review of Systems:   General: no fevers, chills, no body weight gain, has poor appetite, has fatigue HEENT: no blurry vision, hearing changes or sore throat Respiratory: no dyspnea, coughing, wheezing CV: no chest pain, no palpitations GI: no nausea, vomiting, abdominal pain, diarrhea, constipation GU: no dysuria, burning on urination, increased urinary frequency, hematuria  Ext: no leg edema Neuro: no unilateral weakness, numbness, or tingling, no vision change or hearing loss Skin: no rash, no skin tear. MSK: No muscle spasm, no deformity, no limitation of range of movement in spin Heme: No easy bruising.  Travel history: No recent long distant travel.   Allergy:  Allergies  Allergen Reactions    Sulfa Antibiotics Other (See Comments)    Mouth sore and raw    Past Medical History:  Diagnosis Date   Adenomatous colon polyp    Adrenal carcinoma, right (HCC) 12/28/2016   a.) high grade carcinoma with extensive necrosis --> s/p adrenalectomy   Anxiety    Aortic atherosclerosis (HCC)    Arthritis of right knee    Atrial fibrillation and flutter (HCC)    a.) CHA2DS2-VASc = 3 (age, sex, vascular disease history) as of 02/13/2023; b.) cardiac rate/rhythm maintained intrinsically without pharmacological intervention; no chronic OAC (does take low dose ASA)   B12 deficiency    Breast cancer (HCC) 1997   a.) stage IB (cT1a, cN0, cM0, G3, ER+, PR-, HER2-); s/p lumpectomy + XRT   COPD (chronic obstructive pulmonary disease) (HCC)    Coronary artery disease    a.) cCTA 12/09/2021: Ca2+ = 135 (80th %'ile for age/sex/race match contol) --> 25-49% pLAD and RCA distributions   Depression    Diastolic dysfunction    a.) TTE 12/11/2020, no RWMAs, norm RVSF, G1DD; b.) TTE 12/14/2021: EF 55-60, no RWMAs, norm RVSF, G1DD; c.) TTE 01/27/2023: EF 50-55%, no RWMAs, G1DD, mild RAE, mild MR, mod TR   Dry cough 03/02/2017   Dyspnea    Full dentures    GERD (gastroesophageal reflux disease)    History of hiatal hernia    HLD (hyperlipidemia)    Invasive carcinoma of breast (HCC) 11/15/2017   a.) path (+) for G3, ER -, PR -, HER2/neu - ; s/p lumpectomy 01/17/2018 +  4 cycles TC systemic chemotherapy + XRT   Long term current use of aspirin    Personal history of chemotherapy    Personal history of radiation therapy    Pneumothorax of left lung after biopsy 01/25/2023   a.) hypoxic in PACU requiring re-intubation --> transferred to ICU --> chest tube placed by PCCM   RLS (restless legs syndrome)    a.) on ropinirole   Skin cancer    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   a.) Stage IB (cT1a, cN0, cM0, G3, ER+, PR-, HER2-)    Past Surgical History:  Procedure Laterality Date   BIOPSY   01/05/2023   Procedure: BIOPSY;  Surgeon: Toney Reil, MD;  Location: [facility];  Service: Gastroenterology;;   BREAST BIOPSY Right 1997   positive   BREAST BIOPSY Right 11/15/2017   INVASIVE MAMMARY CARCINOMA triple negative   BREAST BIOPSY Right 07/20/2020   [place] bx, Q marker, neg   BREAST LUMPECTOMY Right 1997   2019 also   BREAST LUMPECTOMY WITH SENTINEL LYMPH NODE BIOPSY Right 12/08/2017   Procedure: BREAST LUMPECTOMY WITH SENTINEL LYMPH NODE BX;  Surgeon: Earline Mayotte, MD;  Location: [facility];  Service: General;  Laterality: Right;   BREAST REDUCTION WITH MASTOPEXY Left 06/10/2019   Procedure: Left breast mastopexy reduction for symmetry;  Surgeon: Peggye Form, DO;  Location: [facility];  Service: Plastics;  Laterality: Left;  total 2.5 hours   CATARACT EXTRACTION W/PHACO Right 02/07/2022   Procedure: CATARACT EXTRACTION PHACO AND INTRAOCULAR LENS PLACEMENT (IOC) RIGHT DIABETIC;  Surgeon: Nevada Crane, MD;  Location: [facility];  Service: Ophthalmology;  Laterality: Right;  3.60 00:34.7   CATARACT EXTRACTION W/PHACO Left 02/28/2022   Procedure: CATARACT EXTRACTION PHACO AND INTRAOCULAR LENS PLACEMENT (IOC) LEFT DIABETIC 4.11 00:26.1;  Surgeon: Nevada Crane, MD;  Location: [facility];  Service: Ophthalmology;  Laterality: Left;   COLONOSCOPY  2015   COLONOSCOPY N/A 01/05/2023   Procedure: COLONOSCOPY;  Surgeon: Toney Reil, MD;  Location: [facility];  Service: Gastroenterology;  Laterality: N/A;   COLONOSCOPY WITH PROPOFOL N/A 10/29/2019   Procedure: COLONOSCOPY WITH PROPOFOL;  Surgeon: Midge Minium, MD;  Location: [facility] END[facility];  Service: Endoscopy;  Laterality: N/A;   ESOPHAGOGASTRODUODENOSCOPY (EGD) WITH PROPOFOL N/A 05/24/2019   Procedure: ESOPHAGOGASTRODUODENOSCOPY (EGD) WITH PROPOFOL;  Surgeon: Midge Minium, MD;  Location: [facility];  Service: Endoscopy;  Laterality: N/A;    ESOPHAGOGASTRODUODENOSCOPY (EGD) WITH PROPOFOL N/A 09/06/2022   Procedure: ESOPHAGOGASTRODUODENOSCOPY (EGD) WITH PROPOFOL;  Surgeon: Midge Minium, MD;  Location: [facility];  Service: Endoscopy;  Laterality: N/A;   ESOPHAGOGASTRODUODENOSCOPY (EGD) WITH PROPOFOL  01/05/2023   Procedure: ESOPHAGOGASTRODUODENOSCOPY (EGD) WITH PROPOFOL;  Surgeon: Toney Reil, MD;  Location: [facility];  Service: Gastroenterology;;   ESOPHAGUS SURGERY     IR IMAGING GUIDED PORT INSERTION  02/07/2023   KNEE SURGERY Right    LIPOSUCTION WITH LIPOFILLING Right 06/10/2019   Procedure: right breast scar and capsule release with fat grafting;  Surgeon: Peggye Form, DO;  Location: [facility];  Service: Plastics;  Laterality: Right;   POLYPECTOMY  01/05/2023   Procedure: POLYPECTOMY;  Surgeon: Toney Reil, MD;  Location: [facility];  Service: Gastroenterology;;   [facility] REMOVAL     PORTACATH PLACEMENT Right 01/17/2018   Procedure: INSERTION PORT-A-CATH- RIGHT;  Surgeon: Earline Mayotte, MD;  Location: [facility];  Service: General;  Laterality: Right;   PULMONARY THROMBECTOMY Bilateral 05/12/2023   Procedure:  PULMONARY THROMBECTOMY;  Surgeon: Annice Needy, MD;  Location: [facility];  Service: Cardiovascular;  Laterality: Bilateral;   ROBOTIC ADRENALECTOMY Right 12/28/2016   Procedure: ROBOTIC ADRENALECTOMY;  Surgeon: Vanna Scotland, MD;  Location: [facility];  Service: Urology;  Laterality: Right;   SKIN CANCER EXCISION     THROAT SURGERY  1998   throat cancer    TUBAL LIGATION     VENTRAL HERNIA REPAIR N/A 03/03/2017   Procedure: HERNIA REPAIR VENTRAL ADULT;  Surgeon: Ricarda Frame, MD;  Location: [facility];  Service: General;  Laterality: N/A;   VIDEO BRONCHOSCOPY WITH ENDOBRONCHIAL ULTRASOUND N/A 01/25/2023   Procedure: VIDEO BRONCHOSCOPY WITH ENDOBRONCHIAL ULTRASOUND;  Surgeon: Vida Rigger, MD;  Location: [facility];  Service: Thoracic;  Laterality: N/A;    VIDEO BRONCHOSCOPY WITH ENDOBRONCHIAL ULTRASOUND N/A 02/17/2023   Procedure: VIDEO BRONCHOSCOPY WITH ENDOBRONCHIAL ULTRASOUND;  Surgeon: Vida Rigger, MD;  Location: [facility];  Service: Thoracic;  Laterality: N/A;    Social History:  reports that she quit smoking about 28 years ago. Her smoking use included cigarettes. She started smoking about 58 years ago. She has a 45 pack-year smoking history. She has been exposed to tobacco smoke. She has never used smokeless tobacco. She reports that she does not currently use alcohol after a past usage of about 4.0 standard drinks of alcohol per week. She reports that she does not use drugs.  Family History:  Family History  Problem Relation Age of Onset   Diabetes Mother    Heart attack Mother    Cancer Sister        cancer of eyes, spread to lung, other places. died at 52   Cancer Sister 29       unknown primary, spread to bone, brain died in 32's   Cancer Paternal Aunt        unk type   Cancer Paternal Grandmother        type unk   Prostate cancer Neg Hx    Kidney cancer Neg Hx    Bladder Cancer Neg Hx    Breast cancer Neg Hx      Prior to Admission medications   Medication Sig Start Date End Date Taking? Authorizing Provider  benzonatate (TESSALON) 100 MG capsule Take 2 capsules (200 mg total) by mouth 2 (two) times daily as needed for cough. 03/08/23   Jacky Kindle, FNP  enoxaparin (LOVENOX) 60 MG/0.6ML injection Inject 0.525 mLs (52 mg total) into the skin every 12 (twelve) hours. 05/13/23   Marrion Coy, MD  fentaNYL (DURAGESIC) 12 MCG/HR Place 1 patch onto the skin every 3 (three) days. 05/23/23   Creig Hines, MD  fluconazole (DIFLUCAN) 10 MG/ML suspension TAKE 10 ML (100 MG TOTAL) BY MOUTH DAILY 05/01/23   Alinda Dooms, NP  lidocaine (LIDODERM) 5 % Place 1 patch onto the skin daily. Remove & Discard patch within 12 hours or as directed by MD    [provider]  lidocaine-prilocaine (EMLA) cream Apply to affected  area once 03/09/23   Creig Hines, MD  metoprolol succinate (TOPROL-XL) 25 MG 24 hr tablet Take 1 tablet (25 mg total) by mouth daily. 05/14/23   Marrion Coy, MD  omeprazole (KONVOMEP) 2 mg/mL SUSP oral suspension Take 20 mLs (40 mg total) by mouth daily. 04/12/23   Borders, Daryl Eastern, NP  ondansetron (ZOFRAN-ODT) 4 MG disintegrating tablet Take 1 tablet (4 mg total) by mouth every 8 (eight) hours as needed for nausea or vomiting. 04/12/23  Borders, Daryl Eastern, NP  oxyCODONE (ROXICODONE) 5 MG/5ML solution Take 5 mLs (5 mg total) by mouth every 4 (four) hours as needed for severe pain (pain score 7-10). 05/19/23   Borders, Daryl Eastern, NP  rOPINIRole (REQUIP) 0.25 MG tablet Take by mouth. 05/09/23   [provider]  rosuvastatin (CRESTOR) 40 MG tablet TAKE 1 TABLET BY MOUTH DAILY 01/31/23   Jacky Kindle, FNP  sucralfate (CARAFATE) 1 GM/10ML suspension Take 10 mLs (1 g total) by mouth 4 (four) times daily -  with meals and at bedtime. 04/12/23   Borders, Daryl Eastern, NP    Physical Exam: Vitals:   05/24/23 1827  BP: 122/69  Pulse: 87  Resp: 19  Temp: 98.6 F (37 C)  SpO2: 100%   General: Not in acute distress HEENT:       Eyes: PERRL, EOMI, no jaundice       ENT: No discharge from the ears and nose, no pharynx injection, no tonsillar enlargement.        Neck: No JVD, no bruit, no mass felt. Heme: No neck lymph node enlargement. Cardiac: S1/S2, RRR, No murmurs, No gallops or rubs. Respiratory: No rales, wheezing, rhonchi or rubs. GI: Soft, nondistended, nontender, no rebound pain, no organomegaly, BS present. GU: No hematuria Ext: No pitting leg edema bilaterally. 1+DP/PT pulse bilaterally. Musculoskeletal: No joint deformities, No joint redness or warmth, no limitation of ROM in spin. Skin: No rashes.  Neuro: Alert, oriented X3, cranial nerves II-XII grossly intact, moves all extremities normally. Muscle strength 5/5 in all extremities, sensation to light touch intact. Brachial reflex  2+ bilaterally. Knee reflex 1+ bilaterally. Negative Babinski's sign. Normal finger to nose test. Psych: Patient is not psychotic, no suicidal or hemocidal ideation.  Labs on Admission: I have personally reviewed following labs and imaging studies  CBC: Recent Labs  Lab 05/22/23 1140  WBC 3.8*  NEUTROABS 2.4  HGB 10.2*  HCT 31.5*  MCV 100.3*  PLT 255   Basic Metabolic Panel: Recent Labs  Lab 05/22/23 1140  NA 134*  K 3.3*  CL 99  CO2 25  GLUCOSE 84  BUN 10  CREATININE 0.74  CALCIUM 8.7*   GFR: Estimated Creatinine Clearance: 50.1 mL/min (by C-G formula based on SCr of 0.74 mg/dL). Liver Function Tests: Recent Labs  Lab 05/22/23 1140  AST 23  ALT 12  ALKPHOS 60  BILITOT 0.7  PROT 5.9*  ALBUMIN 3.1*   No results for input(s): "LIPASE", "AMYLASE" in the last 168 hours. No results for input(s): "AMMONIA" in the last 168 hours. Coagulation Profile: No results for input(s): "INR", "PROTIME" in the last 168 hours. Cardiac Enzymes: No results for input(s): "CKTOTAL", "CKMB", "CKMBINDEX", "TROPONINI" in the last 168 hours. BNP (last 3 results) No results for input(s): "PROBNP" in the last 8760 hours. HbA1C: No results for input(s): "HGBA1C" in the last 72 hours. CBG: No results for input(s): "GLUCAP" in the last 168 hours. Lipid Profile: No results for input(s): "CHOL", "HDL", "LDLCALC", "TRIG", "CHOLHDL", "LDLDIRECT" in the last 72 hours. Thyroid Function Tests: No results for input(s): "TSH", "T4TOTAL", "FREET4", "T3FREE", "THYROIDAB" in the last 72 hours. Anemia Panel: No results for input(s): "VITAMINB12", "FOLATE", "FERRITIN", "TIBC", "IRON", "RETICCTPCT" in the last 72 hours. Urine analysis:    Component Value Date/Time   COLORURINE YELLOW (A) 05/10/2023 1629   APPEARANCEUR CLEAR (A) 05/10/2023 1629   APPEARANCEUR Clear 02/27/2023 0959   LABSPEC 1.013 05/10/2023 1629   PHURINE 5.0 05/10/2023 1629   GLUCOSEU 50 (  A) 05/10/2023 1629   HGBUR NEGATIVE  05/10/2023 1629   BILIRUBINUR NEGATIVE 05/10/2023 1629   BILIRUBINUR Negative 02/27/2023 0959   KETONESUR 5 (A) 05/10/2023 1629   PROTEINUR NEGATIVE 05/10/2023 1629   UROBILINOGEN 0.2 11/05/2021 1736   NITRITE NEGATIVE 05/10/2023 1629   LEUKOCYTESUR NEGATIVE 05/10/2023 1629   Sepsis Labs: @LABRCNTIP (procalcitonin:4,lacticidven:4) )No results found for this or any previous visit (from the past 240 hours).   Radiological Exams on Admission:   Assessment/Plan Principal Problem:   Dysphagia Active Problems:   DVT (deep venous thrombosis) (HCC)   Hx of pulmonary embolus   PAF (paroxysmal atrial fibrillation) (HCC)   COPD (chronic obstructive pulmonary disease) (HCC)   Chronic diastolic CHF (congestive heart failure) (HCC)   HLD (hyperlipidemia)   CAD (coronary artery disease)   Hypokalemia   Non-small cell lung cancer (HCC)   Depression with anxiety   Assessment and Plan:   Principal Problem:   Dysphagia Active Problems:   DVT (deep venous thrombosis) (HCC)   Hx of pulmonary embolus   PAF (paroxysmal atrial fibrillation) (HCC)   COPD (chronic obstructive pulmonary disease) (HCC)   Chronic diastolic CHF (congestive heart failure) (HCC)   HLD (hyperlipidemia)   CAD (coronary artery disease)   Hypokalemia   Non-small cell lung cancer (HCC)   Depression with anxiety    DVT ppx: SQ Heparin         SQ Lovenox  Code Status: Full code   ***  Family Communication: not done, no family member is at bed side.              Yes, patient's    at bed side.       by phone  Disposition Plan:  Anticipate discharge back to previous environment  Consults called:    Admission status and Level of care: Telemetry Medical:    for obs as inpt        Dispo: The patient is from: {From:23814}              Anticipated d/c is to: {To:23815}              Anticipated d/c date is: {Days:23816}              Patient currently {Medically stable:23817}    Severity of  Illness:  {Observation/Inpatient:21159}       Date of Service 05/24/2023    Lorretta Harp [facility]   If 7PM-7AM, please contact night-coverage [email or web address] 05/24/2023, 6:31 PM

## 2023-05-24 NOTE — Progress Notes (Signed)
PHARMACY - ANTICOAGULATION CONSULT NOTE  Pharmacy Consult for hx of PE and A fib on lovenox, will need feeding tube placement procedure  Indication: atrial fibrillation  Allergies  Allergen Reactions   Sulfa Antibiotics Other (See Comments)    Mouth sore and raw    Patient Measurements:   Heparin Dosing Weight: 50.1 kg  Vital Signs: Temp: 98.6 F (37 C) (01/22 1827) BP: 122/69 (01/22 1827) Pulse Rate: 87 (01/22 1827)  Labs: Recent Labs    05/22/23 1140  HGB 10.2*  HCT 31.5*  PLT 255  CREATININE 0.74    Estimated Creatinine Clearance: 50.1 mL/min (by C-G formula based on SCr of 0.74 mg/dL).   Medical History: Past Medical History:  Diagnosis Date   Adenomatous colon polyp    Adrenal carcinoma, right (HCC) 12/28/2016   a.) high grade carcinoma with extensive necrosis --> s/p adrenalectomy   Anxiety    Aortic atherosclerosis (HCC)    Arthritis of right knee    Atrial fibrillation and flutter (HCC)    a.) CHA2DS2-VASc = 3 (age, sex, vascular disease history) as of 02/13/2023; b.) cardiac rate/rhythm maintained intrinsically without pharmacological intervention; no chronic OAC (does take low dose ASA)   B12 deficiency    Breast cancer (HCC) 1997   a.) stage IB (cT1a, cN0, cM0, G3, ER+, PR-, HER2-); s/p lumpectomy + XRT   COPD (chronic obstructive pulmonary disease) (HCC)    Coronary artery disease    a.) cCTA 12/09/2021: Ca2+ = 135 (80th %'ile for age/sex/race match contol) --> 25-49% pLAD and RCA distributions   Depression    Diastolic dysfunction    a.) TTE 12/11/2020, no RWMAs, norm RVSF, G1DD; b.) TTE 12/14/2021: EF 55-60, no RWMAs, norm RVSF, G1DD; c.) TTE 01/27/2023: EF 50-55%, no RWMAs, G1DD, mild RAE, mild MR, mod TR   Dry cough 03/02/2017   Dyspnea    Full dentures    GERD (gastroesophageal reflux disease)    History of hiatal hernia    HLD (hyperlipidemia)    Invasive carcinoma of breast (HCC) 11/15/2017   a.) path (+) for G3, ER -, PR -, HER2/neu - ;  s/p lumpectomy 01/17/2018 + 4 cycles TC systemic chemotherapy + XRT   Long term current use of aspirin    Personal history of chemotherapy    Personal history of radiation therapy    Pneumothorax of left lung after biopsy 01/25/2023   a.) hypoxic in PACU requiring re-intubation --> transferred to ICU --> chest tube placed by PCCM   RLS (restless legs syndrome)    a.) on ropinirole   Skin cancer    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   Patient taking chemo weekly for 6 cycles. On lovenox in setting of PE w/ last dose today at approximately 1100 per patient.    Goal of Therapy:  Heparin level 0.3-0.7 units/ml Monitor platelets by anticoagulation protocol: Yes   Plan:  ---Start heparin infusion at 550 units/hr (rate based on data from recent  admission) at 2300 (loading dose not warranted given recent therapeutic LMWH dose) ---heparin infusion stop in place for 05/25/23 0600 at the request of Dr pabon ---Check anti-Xa level in 8 hours and daily while on heparin ---Continue to monitor H&H and platelets  Lowella Bandy 05/24/2023,6:44 PM

## 2023-05-25 ENCOUNTER — Encounter: Payer: Self-pay | Admitting: Oncology

## 2023-05-25 ENCOUNTER — Encounter
Admission: RE | Disposition: A | Discharge status: Skilled Nursing Facility | Payer: Self-pay | Source: Ambulatory Visit | Attending: Osteopathic Medicine

## 2023-05-25 ENCOUNTER — Other Ambulatory Visit: Payer: Self-pay

## 2023-05-25 ENCOUNTER — Ambulatory Visit: Payer: Medicare HMO

## 2023-05-25 ENCOUNTER — Encounter: Payer: Self-pay | Admitting: Internal Medicine

## 2023-05-25 ENCOUNTER — Inpatient Hospital Stay: Payer: Medicare HMO | Admitting: Anesthesiology

## 2023-05-25 ENCOUNTER — Inpatient Hospital Stay: Admission: RE | Admit: 2023-05-25 | Payer: Medicare HMO | Source: Home / Self Care | Admitting: Surgery

## 2023-05-25 DIAGNOSIS — E440 Moderate protein-calorie malnutrition: Secondary | ICD-10-CM | POA: Insufficient documentation

## 2023-05-25 DIAGNOSIS — C3492 Malignant neoplasm of unspecified part of left bronchus or lung: Secondary | ICD-10-CM | POA: Diagnosis not present

## 2023-05-25 DIAGNOSIS — E43 Unspecified severe protein-calorie malnutrition: Secondary | ICD-10-CM | POA: Diagnosis not present

## 2023-05-25 DIAGNOSIS — C3490 Malignant neoplasm of unspecified part of unspecified bronchus or lung: Secondary | ICD-10-CM | POA: Diagnosis not present

## 2023-05-25 HISTORY — PX: GASTROSTOMY: SHX5249

## 2023-05-25 LAB — CBC
HCT: 30.2 % — ABNORMAL LOW (ref 36.0–46.0)
Hemoglobin: 9.5 g/dL — ABNORMAL LOW (ref 12.0–15.0)
MCH: 32.6 pg (ref 26.0–34.0)
MCHC: 31.5 g/dL (ref 30.0–36.0)
MCV: 103.8 fL — ABNORMAL HIGH (ref 80.0–100.0)
Platelets: 172 10*3/uL (ref 150–400)
RBC: 2.91 MIL/uL — ABNORMAL LOW (ref 3.87–5.11)
RDW: 15.7 % — ABNORMAL HIGH (ref 11.5–15.5)
WBC: 2.6 10*3/uL — ABNORMAL LOW (ref 4.0–10.5)
nRBC: 0 % (ref 0.0–0.2)

## 2023-05-25 LAB — BASIC METABOLIC PANEL
Anion gap: 8 (ref 5–15)
BUN: <5 mg/dL — ABNORMAL LOW (ref 8–23)
CO2: 28 mmol/L (ref 22–32)
Calcium: 8.4 mg/dL — ABNORMAL LOW (ref 8.9–10.3)
Chloride: 103 mmol/L (ref 98–111)
Creatinine, Ser: 0.63 mg/dL (ref 0.44–1.00)
GFR, Estimated: >60 mL/min (ref >60)
Glucose, Bld: 94 mg/dL (ref 70–99)
Potassium: 3.3 mmol/L — ABNORMAL LOW (ref 3.5–5.1)
Sodium: 139 mmol/L (ref 135–145)

## 2023-05-25 LAB — HEPARIN LEVEL (UNFRACTIONATED): Heparin Unfractionated: 0.61 [IU]/mL (ref 0.30–0.70)

## 2023-05-25 SURGERY — INSERTION OF GASTROSTOMY TUBE
Anesthesia: General | Site: Abdomen

## 2023-05-25 MED ORDER — HEPARIN (PORCINE) 25000 UT/250ML-% IV SOLN
550.0000 [IU]/h | INTRAVENOUS | Status: AC
  Administered 2023-05-26: 550 [IU]/h via INTRAVENOUS
  Filled 2023-05-25: qty 250

## 2023-05-25 MED ORDER — LACTATED RINGERS IV SOLN
INTRAVENOUS | Status: AC
Start: 2023-05-25 — End: 2023-05-26

## 2023-05-25 MED ORDER — BUPIVACAINE-EPINEPHRINE (PF) 0.25% -1:200000 IJ SOLN
INTRAMUSCULAR | Status: AC
  Filled 2023-05-25: qty 30

## 2023-05-25 MED ORDER — SUCCINYLCHOLINE CHLORIDE 200 MG/10ML IV SOSY
PREFILLED_SYRINGE | INTRAVENOUS | Status: DC | PRN
  Administered 2023-05-25: 40 mg via INTRAVENOUS

## 2023-05-25 MED ORDER — ALBUMIN HUMAN 5 % IV SOLN
INTRAVENOUS | Status: AC
  Filled 2023-05-25: qty 250

## 2023-05-25 MED ORDER — SODIUM CHLORIDE 0.9 % IV SOLN
INTRAVENOUS | Status: AC
  Filled 2023-05-25: qty 2

## 2023-05-25 MED ORDER — ACETAMINOPHEN 10 MG/ML IV SOLN
1000.0000 mg | Freq: Four times a day (QID) | INTRAVENOUS | Status: AC
  Administered 2023-05-25 – 2023-05-26 (×4): 1000 mg via INTRAVENOUS
  Filled 2023-05-25 (×5): qty 100

## 2023-05-25 MED ORDER — 0.9 % SODIUM CHLORIDE (POUR BTL) OPTIME
TOPICAL | Status: DC | PRN
  Administered 2023-05-25: 500 mL

## 2023-05-25 MED ORDER — PROPOFOL 10 MG/ML IV BOLUS
INTRAVENOUS | Status: DC | PRN
  Administered 2023-05-25 (×2): 20 mg via INTRAVENOUS
  Administered 2023-05-25: 50 mg via INTRAVENOUS

## 2023-05-25 MED ORDER — OXYCODONE HCL 5 MG PO TABS: 5.0000 mg | ORAL_TABLET | Freq: Once | ORAL | Status: DC | PRN

## 2023-05-25 MED ORDER — ONDANSETRON HCL 4 MG/2ML IJ SOLN
INTRAMUSCULAR | Status: AC
  Filled 2023-05-25: qty 2

## 2023-05-25 MED ORDER — ALBUMIN HUMAN 5 % IV SOLN: INTRAVENOUS | Status: DC | PRN

## 2023-05-25 MED ORDER — FENTANYL CITRATE (PF) 100 MCG/2ML IJ SOLN: 25.0000 ug | INTRAMUSCULAR | Status: DC | PRN

## 2023-05-25 MED ORDER — DEXAMETHASONE SODIUM PHOSPHATE 10 MG/ML IJ SOLN
INTRAMUSCULAR | Status: DC | PRN
  Administered 2023-05-25: 4 mg via INTRAVENOUS

## 2023-05-25 MED ORDER — PROPOFOL 10 MG/ML IV BOLUS
INTRAVENOUS | Status: AC
  Filled 2023-05-25: qty 40

## 2023-05-25 MED ORDER — OMEPRAZOLE 2 MG/ML ORAL SUSPENSION
40.0000 mg | Freq: Every day | ORAL | Status: DC
  Administered 2023-05-26 – 2023-06-05 (×10): 40 mg via ORAL
  Filled 2023-05-25 (×12): qty 20

## 2023-05-25 MED ORDER — PHENYLEPHRINE 80 MCG/ML (10ML) SYRINGE FOR IV PUSH (FOR BLOOD PRESSURE SUPPORT)
PREFILLED_SYRINGE | INTRAVENOUS | Status: DC | PRN
  Administered 2023-05-25 (×2): 80 ug via INTRAVENOUS

## 2023-05-25 MED ORDER — SUGAMMADEX SODIUM 200 MG/2ML IV SOLN
INTRAVENOUS | Status: DC | PRN
  Administered 2023-05-25: 50 mg via INTRAVENOUS

## 2023-05-25 MED ORDER — FENTANYL CITRATE (PF) 100 MCG/2ML IJ SOLN
INTRAMUSCULAR | Status: AC
  Filled 2023-05-25: qty 2

## 2023-05-25 MED ORDER — FENTANYL 12 MCG/HR TD PT72
1.0000 | MEDICATED_PATCH | TRANSDERMAL | Status: DC
  Administered 2023-05-25 – 2023-06-03 (×4): 1 via TRANSDERMAL
  Filled 2023-05-25 (×4): qty 1

## 2023-05-25 MED ORDER — PHENYLEPHRINE HCL-NACL 20-0.9 MG/250ML-% IV SOLN
INTRAVENOUS | Status: DC | PRN
  Administered 2023-05-25: 40 ug/min via INTRAVENOUS

## 2023-05-25 MED ORDER — PHENYLEPHRINE HCL-NACL 20-0.9 MG/250ML-% IV SOLN
INTRAVENOUS | Status: AC
  Filled 2023-05-25: qty 250

## 2023-05-25 MED ORDER — BUPIVACAINE LIPOSOME 1.3 % IJ SUSP
INTRAMUSCULAR | Status: AC
  Filled 2023-05-25: qty 20

## 2023-05-25 MED ORDER — SODIUM CHLORIDE 0.9 % IV SOLN
2.0000 g | Freq: Two times a day (BID) | INTRAVENOUS | Status: AC
  Administered 2023-05-25 – 2023-05-26 (×2): 2 g via INTRAVENOUS
  Filled 2023-05-25 (×2): qty 2

## 2023-05-25 MED ORDER — ROCURONIUM BROMIDE 10 MG/ML (PF) SYRINGE
PREFILLED_SYRINGE | INTRAVENOUS | Status: AC
  Filled 2023-05-25: qty 10

## 2023-05-25 MED ORDER — METOPROLOL SUCCINATE ER 25 MG PO TB24: 25.0000 mg | ORAL_TABLET | Freq: Every day | ORAL | Status: DC

## 2023-05-25 MED ORDER — LIDOCAINE HCL (PF) 2 % IJ SOLN
INTRAMUSCULAR | Status: AC
  Filled 2023-05-25: qty 5

## 2023-05-25 MED ORDER — EPHEDRINE SULFATE-NACL 50-0.9 MG/10ML-% IV SOSY
PREFILLED_SYRINGE | INTRAVENOUS | Status: DC | PRN
  Administered 2023-05-25: 10 mg via INTRAVENOUS

## 2023-05-25 MED ORDER — ROCURONIUM BROMIDE 100 MG/10ML IV SOLN
INTRAVENOUS | Status: DC | PRN
  Administered 2023-05-25: 10 mg via INTRAVENOUS

## 2023-05-25 MED ORDER — ROSUVASTATIN CALCIUM 10 MG PO TABS
40.0000 mg | ORAL_TABLET | Freq: Every day | ORAL | Status: DC
  Administered 2023-05-26 – 2023-06-05 (×11): 40 mg via ORAL
  Filled 2023-05-25: qty 4
  Filled 2023-05-25 (×2): qty 2
  Filled 2023-05-25: qty 4
  Filled 2023-05-25: qty 2
  Filled 2023-05-25: qty 4
  Filled 2023-05-25 (×2): qty 2
  Filled 2023-05-25: qty 4
  Filled 2023-05-25 (×2): qty 2
  Filled 2023-05-25: qty 4

## 2023-05-25 MED ORDER — NOREPINEPHRINE 4 MG/250ML-% IV SOLN
INTRAVENOUS | Status: AC
  Filled 2023-05-25: qty 250

## 2023-05-25 MED ORDER — OXYCODONE HCL 5 MG/5ML PO SOLN: 5.0000 mg | Freq: Once | ORAL | Status: DC | PRN

## 2023-05-25 MED ORDER — SODIUM CHLORIDE 0.9 % IV BOLUS
500.0000 mL | Freq: Once | INTRAVENOUS | Status: AC
  Administered 2023-05-25: 500 mL via INTRAVENOUS

## 2023-05-25 MED ORDER — BUPIVACAINE-EPINEPHRINE (PF) 0.25% -1:200000 IJ SOLN
INTRAMUSCULAR | Status: DC | PRN
  Administered 2023-05-25: 50 mL

## 2023-05-25 MED ORDER — FENTANYL CITRATE (PF) 100 MCG/2ML IJ SOLN
INTRAMUSCULAR | Status: DC | PRN
  Administered 2023-05-25: 75 ug via INTRAVENOUS
  Administered 2023-05-25: 25 ug via INTRAVENOUS

## 2023-05-25 MED ORDER — ALBUMIN HUMAN 25 % IV SOLN
25.0000 g | Freq: Once | INTRAVENOUS | Status: AC
  Administered 2023-05-25: 25 g via INTRAVENOUS
  Filled 2023-05-25: qty 100

## 2023-05-25 MED ORDER — LIDOCAINE HCL (CARDIAC) PF 100 MG/5ML IV SOSY
PREFILLED_SYRINGE | INTRAVENOUS | Status: DC | PRN
  Administered 2023-05-25: 40 mg via INTRAVENOUS

## 2023-05-25 MED ORDER — ROPINIROLE HCL 0.25 MG PO TABS
0.2500 mg | ORAL_TABLET | Freq: Every day | ORAL | Status: DC
  Administered 2023-05-25 – 2023-06-04 (×11): 0.25 mg via ORAL
  Filled 2023-05-25 (×12): qty 1

## 2023-05-25 SURGICAL SUPPLY — 38 items
BAG URINE DRAIN 2000ML AR STRL (UROLOGICAL SUPPLIES) IMPLANT
BLADE SURG 15 STRL LF DISP TIS (BLADE) ×1 IMPLANT
DERMABOND ADVANCED .7 DNX12 (GAUZE/BANDAGES/DRESSINGS) IMPLANT
DRAPE LAPAROTOMY 100X77 ABD (DRAPES) ×1 IMPLANT
DRSG TEGADERM 4X4.75 (GAUZE/BANDAGES/DRESSINGS) IMPLANT
DRSG TELFA 3X4 N-ADH STERILE (GAUZE/BANDAGES/DRESSINGS) IMPLANT
ELECT REM PT RETURN 9FT ADLT (ELECTROSURGICAL) ×1
ELECTRODE REM PT RTRN 9FT ADLT (ELECTROSURGICAL) ×1 IMPLANT
G-TUBE MIC 18FR ENFIT ADLT (TUBING) IMPLANT
G-TUBE MIC ADLT 16FR ENFIT (TUBING) IMPLANT
GAUZE SPONGE 4X4 12PLY STRL (GAUZE/BANDAGES/DRESSINGS) IMPLANT
GLOVE BIO SURGEON STRL SZ7 (GLOVE) ×1 IMPLANT
GOWN STRL REUS W/ TWL LRG LVL3 (GOWN DISPOSABLE) ×2 IMPLANT
LABEL OR SOLS (LABEL) ×1 IMPLANT
MANIFOLD NEPTUNE II (INSTRUMENTS) ×1 IMPLANT
NDL HYPO 22X1.5 SAFETY MO (MISCELLANEOUS) ×1 IMPLANT
NEEDLE HYPO 22X1.5 SAFETY MO (MISCELLANEOUS) ×1
PACK BASIN MINOR ARMC (MISCELLANEOUS) ×1 IMPLANT
PLUG CATH AND CAP STRL 200 (CATHETERS) IMPLANT
SPONGE DRAIN TRACH 4X4 STRL 2S (GAUZE/BANDAGES/DRESSINGS) IMPLANT
SPONGE T-LAP 18X18 ~~LOC~~+RFID (SPONGE) ×1 IMPLANT
STAPLER SKIN PROX 35W (STAPLE) IMPLANT
SUT ETHILON 3-0 FS-10 30 BLK (SUTURE) ×1
SUT MNCRL 4-0 27 PS-2 XMFL (SUTURE) ×1
SUT MNCRL 4-0 27XMFL (SUTURE) ×1
SUT PDS AB 1 TP1 96 (SUTURE) IMPLANT
SUT PDS PLUS AB 0 CT-2 (SUTURE) ×1 IMPLANT
SUT SILK 2 0 SH (SUTURE) ×2 IMPLANT
SUT SILK 2 0 SH CR/8 (SUTURE) IMPLANT
SUTURE EHLN 3-0 FS-10 30 BLK (SUTURE) IMPLANT
SUTURE MNCRL 4-0 27XMF (SUTURE) IMPLANT
SYR 30ML LL (SYRINGE) ×1 IMPLANT
SYR TOOMEY 50ML (SYRINGE) ×1 IMPLANT
TRAP FLUID SMOKE EVACUATOR (MISCELLANEOUS) ×1 IMPLANT
TUBE GASTRO 18FR ENFIT (TUBING)
TUBE GSTRM 1 16FR INTNL ENFIT (TUBING)
TUBE GSTRM 10.5X14FR YPRT (TUBING) IMPLANT
TUBE MOSS GAS 18FR (TUBING) IMPLANT

## 2023-05-25 NOTE — Plan of Care (Signed)

## 2023-05-25 NOTE — Consult Note (Signed)
Patient ID: Denise Watts, female   DOB: October 11, 1955, 68 y.o.   MRN: [id number]  HPI NIKELA APFELBAUM is a 68 y.o. female LAJUANA DERR is a 68 y.o. female seen in consultation at the request of Dr. Smith Robert , she has a very complex medical history significant for multiple cancer including vocal cord squamous cell carcinoma, esophageal stricture, breast cancer, adrenal carcinoma, non-small cell lung cancer (currently is on chemotherapy for lung cancer), HLD, COPD, CAD, dCHF, depression with anxiety, A-fib,. PE and DVT on Lovenox, mycoplasma pneumonia,  She presents to the hospital due to severe dysphagia, failure to thrive, malnutrition and dehydration.  She does also have severe odynophagia due to the advancement of her vocal cord carcinoma.  She is miserable.  Her p.o. intake has significantly decreased and she has been unable to keep up with her nutritional needs and have developed severe protein malnutrition  Patient was recently hospitalized from 1/8-1/11 due to submassive PE.  Patient was switched to Lovenox for previous Eliquis. Pt has hx of vocal cord squamous cell carcinoma and esophageal stricture.  States that her difficulty swallowing has been progressively worsening recently.  She feels okay for swallowing liquids and water, but not solid food.   He has been admitted to the hospital for optimization regarding her nutritional status and hydration status and also coordination of a gastrostomy feeding tube placement in the setting of need for tight control of anticoagulation due to recent significant PE requiring thrombectomy. Has been very complex process given that the patient really needs a feeding tube and on the other hand has significant clotting disorder.  I have had an extensive review of medical records and also coordination of care with oncology group and we have agreed to perform open gastrostomy tube with bridging of anticoagulation to heparin.  Please note that the patient per Dr. Smith Robert cannot a 4  to be off the anticoagulation due to the recent significant PE I have also personally review her imaging studies and also extensive review is her medical record.  She did have a prior ventral hernia repair in 2018.  She also had a recent CT that I have personally reviewed showing no evidence of intra-abdominal or recurrent hernias. He does have a limited functional reserve due to advanced cancer and multiple medical issues including DVT, PE, COPD, CHF and coronary artery disease. An extensive discussion with the patient regarding options.  She wishes to proceed with gastrostomy tube.  She is states that she is miserable because she is unable to take any p.o. intake and she is losing significant weight as well as having episode of dehydration.  This G-tube is not an elective procedure and definitely needs to be performed.  I also discussed with her options of palliations and at this time she wishes to continue to fight    Patient denies any nausea, vomiting, diarrhea or abdominal pain.  Patient is constipated.  Patient has mild dry cough, no SOB or chest pain.  No fever or chills.  No symptoms of UTI.     Data reviewed independently and ED Course: pt was found to have WBC 4.2, GFR> 60, temperature normal, blood pressure 122/69, heart rate 87, RR 19, oxygen saturation 100% on room air.  Patient is admitted to telemetry bed as inpatient.     HPI  Past Medical History:  Diagnosis Date   Adenomatous colon polyp    Adrenal carcinoma, right (HCC) 12/28/2016   a.) high grade carcinoma with extensive necrosis -->  s/p adrenalectomy   Anxiety    Aortic atherosclerosis (HCC)    Arthritis of right knee    Atrial fibrillation and flutter (HCC)    a.) CHA2DS2-VASc = 3 (age, sex, vascular disease history) as of 02/13/2023; b.) cardiac rate/rhythm maintained intrinsically without pharmacological intervention; no chronic OAC (does take low dose ASA)   B12 deficiency    Breast cancer (HCC) 1997   a.) stage IB  (cT1a, cN0, cM0, G3, ER+, PR-, HER2-); s/p lumpectomy + XRT   COPD (chronic obstructive pulmonary disease) (HCC)    Coronary artery disease    a.) cCTA 12/09/2021: Ca2+ = 135 (80th %'ile for age/sex/race match contol) --> 25-49% pLAD and RCA distributions   Depression    Diastolic dysfunction    a.) TTE 12/11/2020, no RWMAs, norm RVSF, G1DD; b.) TTE 12/14/2021: EF 55-60, no RWMAs, norm RVSF, G1DD; c.) TTE 01/27/2023: EF 50-55%, no RWMAs, G1DD, mild RAE, mild MR, mod TR   Dry cough 03/02/2017   Dyspnea    Full dentures    GERD (gastroesophageal reflux disease)    History of hiatal hernia    HLD (hyperlipidemia)    Invasive carcinoma of breast (HCC) 11/15/2017   a.) path (+) for G3, ER -, PR -, HER2/neu - ; s/p lumpectomy 01/17/2018 + 4 cycles TC systemic chemotherapy + XRT   Long term current use of aspirin    Personal history of chemotherapy    Personal history of radiation therapy    Pneumothorax of left lung after biopsy 01/25/2023   a.) hypoxic in PACU requiring re-intubation --> transferred to ICU --> chest tube placed by PCCM   RLS (restless legs syndrome)    a.) on ropinirole   Skin cancer    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   a.) Stage IB (cT1a, cN0, cM0, G3, ER+, PR-, HER2-)    Past Surgical History:  Procedure Laterality Date   BIOPSY  01/05/2023   Procedure: BIOPSY;  Surgeon: Toney Reil, MD;  Location: [facility];  Service: Gastroenterology;;   BREAST BIOPSY Right 1997   positive   BREAST BIOPSY Right 11/15/2017   INVASIVE MAMMARY CARCINOMA triple negative   BREAST BIOPSY Right 07/20/2020   [place] bx, Q marker, neg   BREAST LUMPECTOMY Right 1997   2019 also   BREAST LUMPECTOMY WITH SENTINEL LYMPH NODE BIOPSY Right 12/08/2017   Procedure: BREAST LUMPECTOMY WITH SENTINEL LYMPH NODE BX;  Surgeon: Earline Mayotte, MD;  Location: [facility];  Service: General;  Laterality: Right;   BREAST REDUCTION WITH MASTOPEXY Left 06/10/2019   Procedure: Left  breast mastopexy reduction for symmetry;  Surgeon: Peggye Form, DO;  Location: [facility];  Service: Plastics;  Laterality: Left;  total 2.5 hours   CATARACT EXTRACTION W/PHACO Right 02/07/2022   Procedure: CATARACT EXTRACTION PHACO AND INTRAOCULAR LENS PLACEMENT (IOC) RIGHT DIABETIC;  Surgeon: Nevada Crane, MD;  Location: [facility];  Service: Ophthalmology;  Laterality: Right;  3.60 00:34.7   CATARACT EXTRACTION W/PHACO Left 02/28/2022   Procedure: CATARACT EXTRACTION PHACO AND INTRAOCULAR LENS PLACEMENT (IOC) LEFT DIABETIC 4.11 00:26.1;  Surgeon: Nevada Crane, MD;  Location: [facility];  Service: Ophthalmology;  Laterality: Left;   COLONOSCOPY  2015   COLONOSCOPY N/A 01/05/2023   Procedure: COLONOSCOPY;  Surgeon: Toney Reil, MD;  Location: [facility] END[facility]COPY;  Service: Gastroenterology;  Laterality: N/A;   COLONOSCOPY WITH PROPOFOL N/A 10/29/2019   Procedure: COLONOSCOPY WITH PROPOFOL;  Surgeon: Midge Minium, MD;  Location: [facility];  Service: Endoscopy;  Laterality: N/A;   ESOPHAGOGASTRODUODENOSCOPY (EGD) WITH PROPOFOL N/A 05/24/2019   Procedure: ESOPHAGOGASTRODUODENOSCOPY (EGD) WITH PROPOFOL;  Surgeon: Midge Minium, MD;  Location: [facility];  Service: Endoscopy;  Laterality: N/A;   ESOPHAGOGASTRODUODENOSCOPY (EGD) WITH PROPOFOL N/A 09/06/2022   Procedure: ESOPHAGOGASTRODUODENOSCOPY (EGD) WITH PROPOFOL;  Surgeon: Midge Minium, MD;  Location: [facility];  Service: Endoscopy;  Laterality: N/A;   ESOPHAGOGASTRODUODENOSCOPY (EGD) WITH PROPOFOL  01/05/2023   Procedure: ESOPHAGOGASTRODUODENOSCOPY (EGD) WITH PROPOFOL;  Surgeon: Toney Reil, MD;  Location: [facility];  Service: Gastroenterology;;   ESOPHAGUS SURGERY     IR IMAGING GUIDED PORT INSERTION  02/07/2023   KNEE SURGERY Right    LIPOSUCTION WITH LIPOFILLING Right 06/10/2019   Procedure: right breast scar and capsule release with fat grafting;  Surgeon: Peggye Form, DO;  Location: [facility];  Service: Plastics;  Laterality: Right;   POLYPECTOMY  01/05/2023   Procedure: POLYPECTOMY;  Surgeon: Toney Reil, MD;  Location: [facility];  Service: Gastroenterology;;   [facility] REMOVAL     PORTACATH PLACEMENT Right 01/17/2018   Procedure: INSERTION PORT-A-CATH- RIGHT;  Surgeon: Earline Mayotte, MD;  Location: [facility];  Service: General;  Laterality: Right;   PULMONARY THROMBECTOMY Bilateral 05/12/2023   Procedure: PULMONARY THROMBECTOMY;  Surgeon: Annice Needy, MD;  Location: [facility];  Service: Cardiovascular;  Laterality: Bilateral;   ROBOTIC ADRENALECTOMY Right 12/28/2016   Procedure: ROBOTIC ADRENALECTOMY;  Surgeon: Vanna Scotland, MD;  Location: [facility];  Service: Urology;  Laterality: Right;   SKIN CANCER EXCISION     THROAT SURGERY  1998   throat cancer    TUBAL LIGATION     VENTRAL HERNIA REPAIR N/A 03/03/2017   Procedure: HERNIA REPAIR VENTRAL ADULT;  Surgeon: Ricarda Frame, MD;  Location: [facility];  Service: General;  Laterality: N/A;   VIDEO BRONCHOSCOPY WITH ENDOBRONCHIAL ULTRASOUND N/A 01/25/2023   Procedure: VIDEO BRONCHOSCOPY WITH ENDOBRONCHIAL ULTRASOUND;  Surgeon: Vida Rigger, MD;  Location: [facility];  Service: Thoracic;  Laterality: N/A;   VIDEO BRONCHOSCOPY WITH ENDOBRONCHIAL ULTRASOUND N/A 02/17/2023   Procedure: VIDEO BRONCHOSCOPY WITH ENDOBRONCHIAL ULTRASOUND;  Surgeon: Vida Rigger, MD;  Location: [facility];  Service: Thoracic;  Laterality: N/A;    Family History  Problem Relation Age of Onset   Diabetes Mother    Heart attack Mother    Cancer Sister        cancer of eyes, spread to lung, other places. died at 42   Cancer Sister 35       unknown primary, spread to bone, brain died in 33's   Cancer Paternal Aunt        unk type   Cancer Paternal Grandmother        type unk   Prostate cancer Neg Hx    Kidney cancer Neg Hx    Bladder Cancer Neg Hx    Breast cancer  Neg Hx     Social History Social History   Tobacco Use   Smoking status: Former    Current packs/day: 0.00    Average packs/day: 1.5 packs/day for 30.0 years (45.0 ttl pk-yrs)    Types: Cigarettes    Start date: 05/02/1965    Quit date: 05/03/1995    Years since quitting: 28.0    Passive exposure: Past   Smokeless tobacco: Never  Vaping Use   Vaping status: Never Used  Substance Use Topics   Alcohol use: Not Currently    Alcohol/week: 4.0 standard  drinks of alcohol    Types: 4 Shots of liquor per week    Comment: none   Drug use: No    Allergies  Allergen Reactions   Sulfa Antibiotics Other (See Comments)    Mouth sore and raw    Current Facility-Administered Medications  Medication Dose Route Frequency Provider Last Rate Last Admin   acetaminophen (TYLENOL) 160 MG/5ML solution 1,000 mg  1,000 mg Oral Q6H Monia Timmers F, MD       albuterol (PROVENTIL) (2.5 MG/3ML) 0.083% nebulizer solution 3 mL  3 mL Inhalation Q4H PRN Lorretta Harp, MD       cefoTEtan (CEFOTAN) 2 g in sodium chloride 0.9 % 100 mL IVPB  2 g Intravenous Once Layken Beg F, MD       Chlorhexidine Gluconate Cloth 2 % PADS 6 each  6 each Topical Q0600 Leafy Ro, MD   6 each at 05/25/23 0557   dextromethorphan-guaiFENesin (MUCINEX DM) 30-600 MG per 12 hr tablet 1 tablet  1 tablet Oral BID PRN Lorretta Harp, MD   1 tablet at 05/25/23 0559   fentaNYL (DURAGESIC) 12 MCG/HR 1 patch  1 patch Transdermal Q72H Lorretta Harp, MD   1 patch at 05/25/23 0556   hydrALAZINE (APRESOLINE) injection 5 mg  5 mg Intravenous Q2H PRN Lorretta Harp, MD       metoprolol succinate (TOPROL-XL) 24 hr tablet 25 mg  25 mg Oral Daily Lorretta Harp, MD       morphine (PF) 2 MG/ML injection 2 mg  2 mg Intravenous Q3H PRN Lorretta Harp, MD   2 mg at 05/24/23 2202   omeprazole (KONVOMEP) 2 mg/mL oral suspension 40 mg  40 mg Oral Daily Lorretta Harp, MD       ondansetron [facility]) injection 4 mg  4 mg Intravenous Q8H PRN Lorretta Harp, MD       oxyCODONE  (ROXICODONE) 5 MG/5ML solution 7.5 mg  7.5 mg Oral Q4H PRN Pollie Poma F, MD   7.5 mg at 05/25/23 0455   rOPINIRole (REQUIP) tablet 0.25 mg  0.25 mg Oral QHS Lorretta Harp, MD       rosuvastatin (CRESTOR) tablet 40 mg  40 mg Oral Daily Lorretta Harp, MD       sodium chloride flush (NS) 0.9 % injection 10-40 mL  10-40 mL Intracatheter Q12H Lorretta Harp, MD   20 mL at 05/24/23 2159   Facility-Administered Medications Ordered in Other Encounters  Medication Dose Route Frequency Provider Last Rate Last Admin   cyanocobalamin ((VITAMIN B-12)) injection 1,000 mcg  1,000 mcg Intramuscular Q30 days Creig Hines, MD   1,000 mcg at 08/27/21 1537   cyanocobalamin (VITAMIN B12) injection 1,000 mcg  1,000 mcg Intramuscular Q30 days Creig Hines, MD   1,000 mcg at 03/16/22 1101     Review of Systems Full ROS  was asked and was negative except for the information on the HPI  Physical Exam Blood pressure (!) 88/51, pulse 80, temperature 97.6 F (36.4 C), temperature source Oral, resp. rate 17, height 5\' 1"  (1.549 m), weight 51.1 kg, SpO2 94%. CONSTITUTIONAL: NAD she is chronically ill and severely malnourished. EYES: Pupils are equal, round, Sclera are non-icteric. EARS, NOSE, MOUTH AND THROAT: The oropharynx is clear. The oral mucosa is pink and moist. Hearing is intact to voice. LYMPH NODES:  Lymph nodes in the neck are normal. RESPIRATORY:  Lungs are clear. There is normal respiratory effort, with equal breath sounds bilaterally, and without pathologic use of accessory  muscles. CARDIOVASCULAR: Heart is regular without murmurs, gallops, or rubs. GI: The abdomen is  soft, nontender, and nondistended. There are no palpable masses. There is no hepatosplenomegaly. There are normal bowel sounds in all quadrants. GU: Rectal deferred.   MUSCULOSKELETAL: Normal muscle strength and tone. Bilateral pitting edema to the mid level of legs SKIN: Turgor is good and there are no pathologic skin lesions or  ulcers. NEUROLOGIC: Motor and sensation is grossly normal. Cranial nerves are grossly intact. PSYCH:  Oriented to person, place and time. Affect is normal.  Data Reviewed  I have personally reviewed the patient's imaging, laboratory findings and medical records.    Assessment/Plan  68 year old female with metastatic cancer and very difficult complex medical issues including recent PE with the need of aggressive anticoagulation, COPD, severe protein malnutrition and failure to thrive. She is between a rock and a hard place.  I think that she has progressed to the point that she does have very limited options.  G-tube in this circumstances can be prolong her life and potentially improve her quality of life.  If we do not perform G-tube she will eventually will be unable to keep her nutritional and hydration needs and eventually subside. Again this G-tube is not necessarily an elective case given all her complex circumstances.  I do think that given all her medical issues COPD and limited functional reserve she is probably best served with an open G-tube.  I do not think that she will be able to tolerate pneumoperitoneum lightly.  More over open approach will allows [place] to control hemostasis in a more meticulous fashion and have control of more variables in this complicated case.  I have discussed with her in detail about the potential risk of bleeding, infection, hernias, cardiovascular and pulmonary issues, the need for prolonged hospitalization and ICU care.  She understands that he is at high risk of developing any of these complications but she wishes to proceed.  Once again this is an extremely difficult situation and she is between a rock and a hard place. Have also coordinated with pharmacy regarding heparin drip.  We have type and cross her and given her soft blood pressure we will resuscitated before a G-tube is placed this afternoon Please note that I spent greater than 75 minutes in this  encounter including extensive review of medical records, extensive review of imaging studies, extensive counseling and coordination of care, placing orders and performing documentation  Sterling Big, MD FACS General Surgeon 05/25/2023, 7:26 AM

## 2023-05-25 NOTE — Consult Note (Addendum)
[facility] Liaison Consult Note    05/25/2023  Denise Watts 1955-12-18 [id number]  [facility] Patient:  Active in [facility] 30 day program noted  [facility] Liaison remote coverage for Elliot Cousin for review of patient admitted to [facility]    Primary Care Provider:  Sherlyn Hay, DO this provider is listed to provide the community transition of care follow up and [facility] calls  Insurance: [facility]  Patient reviewed for high risk less than 30 days readmission and is currently active with [facility] for care coordination services.  Patient has been engaged by a Community [facility] RN Care Coordination prior to admission.  The community based plan of care has focused on disease management and community resource support.    Plan: Continue to follow for any additional community care coordination needs for post hospital/community needs. Will notify [facility] Liaison and [facility] RN [facility] of admission.   Of note, [facility] services does not replace or interfere with any services that are needed or arranged by inpatient [facility] care management team.   Charlesetta Shanks, RN, BSN, CCM [place]  [facility], [facility] Liaison Direct Dial: [phone] or secure chat Email: [email or web address]

## 2023-05-25 NOTE — Progress Notes (Signed)
PROGRESS NOTE    Denise Watts   AVW:[id number] DOB: 08/04/1955  DOA: 05/24/2023 Date of Service: 05/25/23 which is hospital day 1  PCP: Sherlyn Hay, DO    Hospital course / significant events:   HPI:  Denise Watts is a 68 y.o. female with medical history significant of  multiple cancer including vocal cord squamous cell carcinoma, esophageal stricture, breast cancer, adrenal carcinoma, non-small cell lung cancer (currently is on chemotherapy for lung cancer), HLD, COPD, CAD, dCHF, depression with anxiety, A-fib,. PE and DVT on Lovenox, mycoplasma pneumonia, who presents with dysphagia. Pt has hx of vocal cord squamous cell carcinoma and esophageal stricture.  States that her difficulty swallowing has been progressively worsening recently. She feels okay for swallowing liquids and water, but not solid food. She is direct admit w/ plan for feeding tube placement.   01/22: admitted to hospitalist service w/ general surgery consult 01/23: placement G-tube, plan for tube feeds tomorrow, heparin gtt tomorrow (recent hx PE)     Consultants:  General surgery   Procedures/Surgeries: 05/25/23 G tube placed - Dr Everlene Farrier      ASSESSMENT & PLAN:  Dysphagia due to esophageal stricture secondary to history of vocal cord squamous cell carcinoma: S/p G tube placement Dietary to follow for tube feed initiation   Hx of DVT (deep venous thrombosis) and hx of pulmonary embolus Lovenox to IV heparin --> will need to hold IV heparin at 6:00 AM per Dr. Everlene Farrier   PAF (paroxysmal atrial fibrillation)  Heart rate 87 Metoprolol held d/t soft BP On IV heparin now   COPD (chronic obstructive pulmonary disease) (HCC): Stable bronchodilators and as needed Mucinex   Chronic diastolic CHF (congestive heart failure) [facility]): Patient has leg edema, but BNP normal 73.7, no oxygen desaturation.  Clinically does not seem to have CHF exacerbation. watch volume status closely   HLD  (hyperlipidemia) Crestor   CAD (coronary artery disease) Crestor   Non-small cell lung cancer (HCC):  On chemotherapy, last dose was 2 weeks ago. Follow-up with Dr. Rana Snare   Depression with anxiety Current new home [place]        DVT prophylaxis: heparin to restart tomorrow ok per Dr Everlene Farrier  IV fluids: no continuous IV fluids  Nutrition: tube feeds to start tomorrow, for now ok for sips liquids  Central lines / invasive devices: G tube, port  Code Status: FULL CODE ACP documentation reviewed:  none on file in [facility]  TOC needs: TBD Barriers to dispo / significant pending items: start tube feeds on heparin tomorrow monitoring post op, anticipate discharge in 1-2 days / cleared by surgery              Subjective / Brief ROS:  Patient reports pain this morning c/w her usual cancer pain Denies CP/SOB.  Pain controlled but meds are wearing off  Denies new weakness..  Reports no concerns w/ urination/defecation.   Family Communication: none at this time     Objective Findings:  Vitals:   05/25/23 1533 05/25/23 1536 05/25/23 1545 05/25/23 1600  BP: 115/62  118/68 110/68  Pulse: 98 96 93 99  Resp: 18 (!) 22 15 17   Temp: 97.8 F (36.6 C)     TempSrc:      SpO2: 93% (!) 88% 96% 96%  Weight:      Height:        Intake/Output Summary (Last 24 hours) at 05/25/2023 1627 Last data filed at 05/25/2023 1610 Gross per 24 hour  Intake  2395.22 ml  Output 10 ml  Net 2385.22 ml   Filed Weights   05/24/23 1840 05/24/23 2050 05/25/23 0354  Weight: 49.9 kg 51.1 kg 51.1 kg    Examination:  Physical Exam Constitutional:      General: She is not in acute distress. Cardiovascular:     Rate and Rhythm: Normal rate and regular rhythm.  Pulmonary:     Effort: Pulmonary effort is normal.     Breath sounds: Normal breath sounds.  Abdominal:     Palpations: Abdomen is soft.  Neurological:     Mental Status: She is alert. Mental status is at baseline.  Psychiatric:         Mood and Affect: Mood normal.          Scheduled Medications:   [MAR Hold] acetaminophen (TYLENOL) oral liquid 160 mg/5 mL  1,000 mg Oral Q6H   [MAR Hold] Chlorhexidine Gluconate Cloth  6 each Topical Q0600   [MAR Hold] fentaNYL  1 patch Transdermal Q72H   [MAR Hold] omeprazole  40 mg Oral Daily   [MAR Hold] rOPINIRole  0.25 mg Oral QHS   [MAR Hold] rosuvastatin  40 mg Oral Daily   [MAR Hold] sodium chloride flush  10-40 mL Intracatheter Q12H    Continuous Infusions:  [START ON 05/26/2023] heparin     lactated ringers 40 mL/hr at 05/25/23 1403    PRN Medications:  [MAR Hold] albuterol, [MAR Hold] dextromethorphan-guaiFENesin, fentaNYL (SUBLIMAZE) injection, [MAR Hold] hydrALAZINE, [MAR Hold]  morphine injection, [MAR Hold] ondansetron (ZOFRAN) IV, oxyCODONE **OR** oxyCODONE, [MAR Hold] oxyCODONE  Antimicrobials from admission:  Anti-infectives (From admission, onward)    Start     Dose/Rate Route Frequency Ordered Stop   05/25/23 1200  [MAR Hold]  cefoTEtan (CEFOTAN) 2 g in sodium chloride 0.9 % 100 mL IVPB        (MAR Hold since Thu 05/25/2023 at 1251.Hold Reason: Transfer to a Procedural area)   2 g 200 mL/hr over 30 Minutes Intravenous  Once 05/24/23 1850 05/25/23 1413           Data Reviewed:  I have personally reviewed the following...  CBC: Recent Labs  Lab 05/22/23 1140 05/24/23 1903 05/25/23 0645  WBC 3.8* 4.2 2.6*  NEUTROABS 2.4  --   --   HGB 10.2* 11.1* 9.5*  HCT 31.5* 35.0* 30.2*  MCV 100.3* 103.9* 103.8*  PLT 255 248 172   Basic Metabolic Panel: Recent Labs  Lab 05/22/23 1140 05/24/23 1903 05/25/23 0645  NA 134* 138 139  K 3.3* 4.0 3.3*  CL 99 98 103  CO2 25 27 28   GLUCOSE 84 100* 94  BUN 10 8 <5*  CREATININE 0.74 0.69 0.63  CALCIUM 8.7* 9.0 8.4*  MG  --  2.2  --   PHOS  --  3.3  --    GFR: Estimated Creatinine Clearance: 50.1 mL/min (by C-G formula based on SCr of 0.63 mg/dL). Liver Function Tests: Recent Labs  Lab  05/22/23 1140  AST 23  ALT 12  ALKPHOS 60  BILITOT 0.7  PROT 5.9*  ALBUMIN 3.1*   No results for input(s): "LIPASE", "AMYLASE" in the last 168 hours. No results for input(s): "AMMONIA" in the last 168 hours. Coagulation Profile: Recent Labs  Lab 05/24/23 1903  INR 1.1   Cardiac Enzymes: No results for input(s): "CKTOTAL", "CKMB", "CKMBINDEX", "TROPONINI" in the last 168 hours. BNP (last 3 results) No results for input(s): "PROBNP" in the last 8760 hours. HbA1C: No results for  input(s): "HGBA1C" in the last 72 hours. CBG: No results for input(s): "GLUCAP" in the last 168 hours. Lipid Profile: No results for input(s): "CHOL", "HDL", "LDLCALC", "TRIG", "CHOLHDL", "LDLDIRECT" in the last 72 hours. Thyroid Function Tests: No results for input(s): "TSH", "T4TOTAL", "FREET4", "T3FREE", "THYROIDAB" in the last 72 hours. Anemia Panel: No results for input(s): "VITAMINB12", "FOLATE", "FERRITIN", "TIBC", "IRON", "RETICCTPCT" in the last 72 hours. Most Recent Urinalysis On File:     Component Value Date/Time   COLORURINE YELLOW (A) 05/10/2023 1629   APPEARANCEUR CLEAR (A) 05/10/2023 1629   APPEARANCEUR Clear 02/27/2023 0959   LABSPEC 1.013 05/10/2023 1629   PHURINE 5.0 05/10/2023 1629   GLUCOSEU 50 (A) 05/10/2023 1629   HGBUR NEGATIVE 05/10/2023 1629   BILIRUBINUR NEGATIVE 05/10/2023 1629   BILIRUBINUR Negative 02/27/2023 0959   KETONESUR 5 (A) 05/10/2023 1629   PROTEINUR NEGATIVE 05/10/2023 1629   UROBILINOGEN 0.2 11/05/2021 1736   NITRITE NEGATIVE 05/10/2023 1629   LEUKOCYTESUR NEGATIVE 05/10/2023 1629   Sepsis Labs: @LABRCNTIP (procalcitonin:4,lacticidven:4) Microbiology: No results found for this or any previous visit (from the past 240 hours).    Radiology Studies last 3 days: No results found.       Sunnie Nielsen, DO [facility] 05/25/2023, 4:27 PM    Dictation software may have been used to generate the above note. Typos may occur and escape  review in typed/dictated notes. Please contact Dr Lyn Hollingshead directly for clarity if needed.  Staff may message me via secure chat in Epic  but this may not receive an immediate response,  please page me for urgent matters!  If 7PM-7AM, please contact night coverage [email or web address]

## 2023-05-25 NOTE — Transfer of Care (Signed)
Immediate Anesthesia Transfer of Care Note  Patient: Denise Watts  Procedure(s) Performed: OPEN INSERTION OF GASTROSTOMY TUBE (Abdomen)  Patient Location: PACU  Anesthesia Type:General  Level of Consciousness: awake and alert   Airway & Oxygen Therapy: Patient Spontanous Breathing  Post-op Assessment: Report given to RN and Post -op Vital signs reviewed and stable  Post vital signs: stable  Last Vitals:  Vitals Value Taken Time  BP    Temp 97.8   Pulse 95 05/25/23 1531  Resp 24 05/25/23 1531  SpO2 92 % 05/25/23 1531  Vitals shown include unfiled device data.  Last Pain:  Vitals:   05/25/23 1304  TempSrc: Temporal  PainSc: 0-No pain      Patients Stated Pain Goal: 0 (05/25/23 1049)  Complications: No notable events documented.

## 2023-05-25 NOTE — Progress Notes (Signed)
PHARMACY - ANTICOAGULATION CONSULT NOTE  Pharmacy Consult for hx of PE and A fib on lovenox, s/p feeding tube placement procedure  Indication: atrial fibrillation  Allergies  Allergen Reactions   Sulfa Antibiotics Other (See Comments)    Mouth sore and raw    Patient Measurements: Height: 5\' 1"  (154.9 cm) Weight: 51.1 kg (112 lb 10.5 oz) IBW/kg (Calculated) : 47.8 Heparin Dosing Weight: 50.1 kg  Vital Signs: Temp: 98.9 F (37.2 C) (01/23 1304) Temp Source: Temporal (01/23 1304) BP: 123/76 (01/23 1304) Pulse Rate: 88 (01/23 1304)  Labs: Recent Labs    05/24/23 1903 05/25/23 0645  HGB 11.1* 9.5*  HCT 35.0* 30.2*  PLT 248 172  APTT 32  --   LABPROT 14.5  --   INR 1.1  --   HEPARINUNFRC  --  0.61  CREATININE 0.69 0.63    Estimated Creatinine Clearance: 50.1 mL/min (by C-G formula based on SCr of 0.63 mg/dL).   Medical History: Past Medical History:  Diagnosis Date   Adenomatous colon polyp    Adrenal carcinoma, right (HCC) 12/28/2016   a.) high grade carcinoma with extensive necrosis --> s/p adrenalectomy   Anxiety    Aortic atherosclerosis (HCC)    Arthritis of right knee    Atrial fibrillation and flutter (HCC)    a.) CHA2DS2-VASc = 3 (age, sex, vascular disease history) as of 02/13/2023; b.) cardiac rate/rhythm maintained intrinsically without pharmacological intervention; no chronic OAC (does take low dose ASA)   B12 deficiency    Breast cancer (HCC) 1997   a.) stage IB (cT1a, cN0, cM0, G3, ER+, PR-, HER2-); s/p lumpectomy + XRT   COPD (chronic obstructive pulmonary disease) (HCC)    Coronary artery disease    a.) cCTA 12/09/2021: Ca2+ = 135 (80th %'ile for age/sex/race match contol) --> 25-49% pLAD and RCA distributions   Depression    Diastolic dysfunction    a.) TTE 12/11/2020, no RWMAs, norm RVSF, G1DD; b.) TTE 12/14/2021: EF 55-60, no RWMAs, norm RVSF, G1DD; c.) TTE 01/27/2023: EF 50-55%, no RWMAs, G1DD, mild RAE, mild MR, mod TR   Dry cough  03/02/2017   Dyspnea    Full dentures    GERD (gastroesophageal reflux disease)    History of hiatal hernia    HLD (hyperlipidemia)    Invasive carcinoma of breast (HCC) 11/15/2017   a.) path (+) for G3, ER -, PR -, HER2/neu - ; s/p lumpectomy 01/17/2018 + 4 cycles TC systemic chemotherapy + XRT   Long term current use of aspirin    Personal history of chemotherapy    Personal history of radiation therapy    Pneumothorax of left lung after biopsy 01/25/2023   a.) hypoxic in PACU requiring re-intubation --> transferred to ICU --> chest tube placed by PCCM   RLS (restless legs syndrome)    a.) on ropinirole   Skin cancer    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   a.) Stage IB (cT1a, cN0, cM0, G3, ER+, PR-, HER2-)    Medications:  Scheduled:   [MAR Hold] acetaminophen (TYLENOL) oral liquid 160 mg/5 mL  1,000 mg Oral Q6H   [MAR Hold] Chlorhexidine Gluconate Cloth  6 each Topical Q0600   [MAR Hold] fentaNYL  1 patch Transdermal Q72H   [MAR Hold] omeprazole  40 mg Oral Daily   [MAR Hold] rOPINIRole  0.25 mg Oral QHS   [MAR Hold] rosuvastatin  40 mg Oral Daily   [MAR Hold] sodium chloride flush  10-40 mL Intracatheter  Q12H    Assessment: 68 year old female with past medical history significant for COPD, CAD, anxiety and depression, atrial fibrillation/flutter, HDS, RLS, dysphagia,  breast cancer sp surgery and radiation, carcinoma of the vocal cord sp surgery and radiation (subsequent dysphagia), adrenal carcinoma of unknown primary, now with non-small cell lung cancer now on chemotherapy and radiation therapy.   Patient taking chemo weekly for 6 cycles. On lovenox in setting of PE w/ last dose 01/22 at approximately 1100 per patient.    Goal of Therapy:  Heparin level 0.3-0.7 units/ml Monitor platelets by anticoagulation protocol: Yes   Plan:  ---Start heparin infusion at 550 units/hr (rate based on data from recent admission) at 0700 05/26/23 at the request of Dr  Everlene Farrier ---Check anti-Xa level in 8 hours after initiation and at least once daily while on IV heparin ---Continue to monitor H&H and platelets  Lowella Bandy 05/25/2023,3:29 PM

## 2023-05-25 NOTE — Anesthesia Postprocedure Evaluation (Signed)
Anesthesia Post Note  Patient: Denise Watts  Procedure(s) Performed: OPEN INSERTION OF GASTROSTOMY TUBE (Abdomen)  Patient location during evaluation: PACU Anesthesia Type: General Level of consciousness: patient cooperative and awake Pain management: pain level controlled Vital Signs Assessment: post-procedure vital signs reviewed and stable Respiratory status: spontaneous breathing, nonlabored ventilation and respiratory function stable Cardiovascular status: blood pressure returned to baseline and stable Postop Assessment: adequate PO intake Anesthetic complications: no   No notable events documented.   Last Vitals:  Vitals:   05/25/23 1545 05/25/23 1600  BP: 118/68 110/68  Pulse: 93 99  Resp: 15 17  Temp:    SpO2: 96% 96%    Last Pain:  Vitals:   05/25/23 1600  TempSrc:   PainSc: 0-No pain                 Reed Breech

## 2023-05-25 NOTE — Anesthesia Preprocedure Evaluation (Signed)
Anesthesia Evaluation  Patient identified by MRN, date of birth, ID band Patient awake    Reviewed: Allergy & Precautions, NPO status , Patient's Chart, lab work & pertinent test results  History of Anesthesia Complications Negative for: history of anesthetic complications  Airway Mallampati: III  TM Distance: <3 FB Neck ROM: full    Dental  (+) Missing   Pulmonary shortness of breath and with exertion, pneumonia, unresolved, COPD, former smoker   + rhonchi  + decreased breath sounds      Cardiovascular (-) angina + CAD and +CHF  Normal cardiovascular exam     Neuro/Psych  PSYCHIATRIC DISORDERS      negative neurological ROS     GI/Hepatic Neg liver ROS, hiatal hernia,GERD  Controlled,,  Endo/Other  negative endocrine ROS    Renal/GU Renal disease     Musculoskeletal   Abdominal   Peds  Hematology negative hematology ROS (+)   Anesthesia Other Findings Past Medical History: No date: Adenomatous colon polyp 12/28/2016: Adrenal carcinoma, right (HCC)     Comment:  a.) high grade carcinoma with extensive necrosis --> s/p              adrenalectomy No date: Anxiety No date: Aortic atherosclerosis (HCC) No date: Arthritis of right knee No date: Atrial fibrillation and flutter (HCC)     Comment:  a.) CHA2DS2-VASc = 3 (age, sex, vascular disease               history) as of 02/13/2023; b.) cardiac rate/rhythm               maintained intrinsically without pharmacological               intervention; no chronic OAC (does take low dose ASA) No date: B12 deficiency 1997: Breast cancer (HCC)     Comment:  a.) stage IB (cT1a, cN0, cM0, G3, ER+, PR-, HER2-); s/p               lumpectomy + XRT No date: COPD (chronic obstructive pulmonary disease) (HCC) No date: Coronary artery disease     Comment:  a.) cCTA 12/09/2021: Ca2+ = 135 (80th %'ile for               age/sex/race match contol) --> 25-49% pLAD and RCA                distributions No date: Depression No date: Diastolic dysfunction     Comment:  a.) TTE 12/11/2020, no RWMAs, norm RVSF, G1DD; b.) TTE               12/14/2021: EF 55-60, no RWMAs, norm RVSF, G1DD; c.) TTE               01/27/2023: EF 50-55%, no RWMAs, G1DD, mild RAE, mild MR,              mod TR 03/02/2017: Dry cough No date: Dyspnea No date: Full dentures No date: GERD (gastroesophageal reflux disease) No date: History of hiatal hernia No date: HLD (hyperlipidemia) 11/15/2017: Invasive carcinoma of breast (HCC)     Comment:  a.) path (+) for G3, ER -, PR -, HER2/neu - ; s/p               lumpectomy 01/17/2018 + 4 cycles TC systemic chemotherapy              + XRT No date: Long term current use of aspirin No date: Personal history of chemotherapy No date:  Personal history of radiation therapy 01/25/2023: Pneumothorax of left lung after biopsy     Comment:  a.) hypoxic in PACU requiring re-intubation -->               transferred to ICU --> chest tube placed by PCCM No date: RLS (restless legs syndrome)     Comment:  a.) on ropinirole No date: Skin cancer 2015: Squamous cell carcinoma of vocal cord (HCC)     Comment:  a.) Stage IB (cT1a, cN0, cM0, G3, ER+, PR-, HER2-)  Past Surgical History: 01/05/2023: BIOPSY     Comment:  Procedure: BIOPSY;  Surgeon: Toney Reil, MD;                Location: [facility];  Service: Gastroenterology;; 1997: BREAST BIOPSY; Right     Comment:  positive 11/15/2017: BREAST BIOPSY; Right     Comment:  INVASIVE MAMMARY CARCINOMA triple negative 07/20/2020: BREAST BIOPSY; Right     Comment:  [place] bx, Q marker, neg 1997: BREAST LUMPECTOMY; Right     Comment:  2019 also 12/08/2017: BREAST LUMPECTOMY WITH SENTINEL LYMPH NODE BIOPSY; Right     Comment:  Procedure: BREAST LUMPECTOMY WITH SENTINEL LYMPH NODE               BX;  Surgeon: Earline Mayotte, MD;  Location: [facility]               ORS;  Service: General;  Laterality:  Right; 06/10/2019: BREAST REDUCTION WITH MASTOPEXY; Left     Comment:  Procedure: Left breast mastopexy reduction for symmetry;              Surgeon: Peggye Form, DO;  Location: [place]               [facility];  Service: Plastics;  Laterality: Left;                total 2.5 hours 02/07/2022: CATARACT EXTRACTION W/PHACO; Right     Comment:  Procedure: CATARACT EXTRACTION PHACO AND INTRAOCULAR               LENS PLACEMENT (IOC) RIGHT DIABETIC;  Surgeon: Nevada Crane, MD;  [facility]: [facility];                Service: Ophthalmology;  Laterality: Right;                3.60 00:34.7 02/28/2022: CATARACT EXTRACTION W/PHACO; Left     Comment:  Procedure: CATARACT EXTRACTION PHACO AND INTRAOCULAR               LENS PLACEMENT (IOC) LEFT DIABETIC 4.11 00:26.1;                Surgeon: Nevada Crane, MD;  Location: [facility];  Service: Ophthalmology;  Laterality: Left; 2015: COLONOSCOPY 01/05/2023: COLONOSCOPY; N/A     Comment:  Procedure: COLONOSCOPY;  Surgeon: Toney Reil,               MD;  Location: [facility];  Service:               Gastroenterology;  Laterality: N/A; 10/29/2019: COLONOSCOPY WITH PROPOFOL; N/A     Comment:  Procedure: COLONOSCOPY WITH PROPOFOL;  Surgeon: Servando Snare,  Darren, MD;  Location: [facility];  Service:               Endoscopy;  Laterality: N/A; 05/24/2019: ESOPHAGOGASTRODUODENOSCOPY (EGD) WITH PROPOFOL; N/A     Comment:  Procedure: ESOPHAGOGASTRODUODENOSCOPY (EGD) WITH               PROPOFOL;  Surgeon: Midge Minium, MD;  Location: [facility]               ENDOSCOPY;  Service: Endoscopy;  Laterality: N/A; 09/06/2022: ESOPHAGOGASTRODUODENOSCOPY (EGD) WITH PROPOFOL; N/A     Comment:  Procedure: ESOPHAGOGASTRODUODENOSCOPY (EGD) WITH               PROPOFOL;  Surgeon: Midge Minium, MD;  Location: [facility]               END[facility];  Service: Endoscopy;  Laterality: N/A; 01/05/2023:  ESOPHAGOGASTRODUODENOSCOPY (EGD) WITH PROPOFOL     Comment:  Procedure: ESOPHAGOGASTRODUODENOSCOPY (EGD) WITH               PROPOFOL;  Surgeon: Toney Reil, MD;  Location:               [facility];  Service: Gastroenterology;; No date: ESOPHAGUS SURGERY 02/07/2023: IR IMAGING GUIDED PORT INSERTION No date: KNEE SURGERY; Right 06/10/2019: LIPOSUCTION WITH LIPOFILLING; Right     Comment:  Procedure: right breast scar and capsule release with               fat grafting;  Surgeon: Peggye Form, DO;                Location: [facility];  Service: Plastics;               Laterality: Right; 01/05/2023: POLYPECTOMY     Comment:  Procedure: POLYPECTOMY;  Surgeon: Toney Reil,               MD;  Location: [facility];  Service:               Gastroenterology;; No date: PORT-A-CATH REMOVAL 01/17/2018: PORTACATH PLACEMENT; Right     Comment:  Procedure: INSERTION PORT-A-CATH- RIGHT;  Surgeon:               Earline Mayotte, MD;  Location: [facility];  Service:               General;  Laterality: Right; 05/12/2023: PULMONARY THROMBECTOMY; Bilateral     Comment:  Procedure: PULMONARY THROMBECTOMY;  Surgeon: Annice Needy, MD;  Location: [facility];  Service:               Cardiovascular;  Laterality: Bilateral; 12/28/2016: ROBOTIC ADRENALECTOMY; Right     Comment:  Procedure: ROBOTIC ADRENALECTOMY;  Surgeon: Vanna Scotland, MD;  Location: [facility];  Service: Urology;                Laterality: Right; No date: SKIN CANCER EXCISION 1998: THROAT SURGERY     Comment:  throat cancer  No date: TUBAL LIGATION 03/03/2017: VENTRAL HERNIA REPAIR; N/A     Comment:  Procedure: HERNIA REPAIR VENTRAL ADULT;  Surgeon:               Ricarda Frame, MD;  Location: [facility];  Service:  General;  Laterality: N/A; 01/25/2023: VIDEO BRONCHOSCOPY WITH ENDOBRONCHIAL ULTRASOUND; N/A     Comment:  Procedure: VIDEO  BRONCHOSCOPY WITH ENDOBRONCHIAL               ULTRASOUND;  Surgeon: Vida Rigger, MD;  Location:               [facility];  Service: Thoracic;  Laterality: N/A; 02/17/2023: VIDEO BRONCHOSCOPY WITH ENDOBRONCHIAL ULTRASOUND; N/A     Comment:  Procedure: VIDEO BRONCHOSCOPY WITH ENDOBRONCHIAL               ULTRASOUND;  Surgeon: Vida Rigger, MD;  Location:               [facility];  Service: Thoracic;  Laterality: N/A;  BMI    Body Mass Index: 21.29 kg/m      Reproductive/Obstetrics negative OB ROS                             Anesthesia Physical Anesthesia Plan  ASA: 3  Anesthesia Plan: General ETT   Post-op Pain Management:    Induction: Intravenous  PONV Risk Score and Plan: Ondansetron, Dexamethasone, Midazolam and Treatment may vary due to age or medical condition  Airway Management Planned: Oral ETT  Additional Equipment:   Intra-op Plan:   Post-operative Plan: Extubation in OR  Informed Consent: I have reviewed the patients History and Physical, chart, labs and discussed the procedure including the risks, benefits and alternatives for the proposed anesthesia with the patient or authorized representative who has indicated his/her understanding and acceptance.     Dental Advisory Given  Plan Discussed with: Anesthesiologist, CRNA and Surgeon  Anesthesia Plan Comments: (Patient consented for risks of anesthesia including but not limited to:  - adverse reactions to medications - damage to eyes, teeth, lips or other oral mucosa - nerve damage due to positioning  - sore throat or hoarseness - Damage to heart, brain, nerves, lungs, other parts of body or loss of life  Patient voiced understanding and assent.)       Anesthesia Quick Evaluation

## 2023-05-25 NOTE — Op Note (Signed)
Gastrostomy tube placement    Pre-operative Diagnosis: malnutrition, COPD, CAD, Metastatic CA   Post-operative Diagnosis: same   Procedure:  Gastrostomy tube placement  # 14 FR   Surgeon: Sterling Big, MD FACS   Anesthesia: Gen. with endotracheal tube   Findings: G tube within gastric lumen w/o injuries   Estimated Blood Loss:5 cc             Complications: none     Procedure Details  The patient was seen again in the Holding Room. The benefits, complications, treatment options, and expected outcomes were discussed with the Family. The risks of bleeding, infection, recurrence of symptoms, failure to resolve symptoms, bowel injury, any of which could require further surgery were reviewed .   The  family concurred with the proposed plan, giving informed consent.  The patient was taken to Operating Room, identified  and the procedure verified. A Time Out was held and the above information confirmed.   Prior to the induction of general anesthesia, antibiotic prophylaxis was administered. VTE prophylaxis was in place. General endotracheal anesthesia was then administered and tolerated well. After the induction, the abdomen was prepped with Chloraprep and draped in the sterile fashion. The patient was positioned in the supine position.   Midline laparotomy performed with 10 blade knife, fascia elevated and incised. Abdominal cavity entered w/o any injuries. THe stomach was grasped with a Babcock and  I performed a gastrotomy and confirmed that I was intraluminally. Two purse string sutures were placed around gastrostomy . I placed the G-tube through the gastrotomy in direct fashion.  I flushed saline via the G-tube confirming the proper position of the tube intraluminally.  The balloon was inflated and pulled back.  We then were able to tied the first purse strings in the standard fashion to the abdominal wall. Inspection of the  upper quadrant was performed. No bleeding, bile duct injury or  leak, or bowel injury was noted.  All the needles were removed under direct visualization.Liposomal Marcaine was used to infiltrate the abdominal wall at all incision sites  Fascia closed w 0 PDS using small bite technique in the standard fashion. 4-0 subcuticular Monocryl was used to close the skin. Dermabond was  applied. Sponge, lap, and needle counts were correct at closure and at the conclusion of the case.  No complications              Sterling Big, MD, FACS

## 2023-05-25 NOTE — Anesthesia Procedure Notes (Signed)
Procedure Name: Intubation Date/Time: 05/25/2023 2:15 PM  Performed by: Darrell Jewel I, CRNAPre-anesthesia Checklist: Patient identified, Emergency Drugs available, Suction available, Patient being monitored and Timeout performed Patient Re-evaluated:Patient Re-evaluated prior to induction Oxygen Delivery Method: Circle system utilized Preoxygenation: Pre-oxygenation with 100% oxygen Induction Type: IV induction Ventilation: Mask ventilation without difficulty Laryngoscope Size: McGrath and 3 Grade View: Grade I Tube type: Oral Tube size: 6.5 mm Number of attempts: 1 Airway Equipment and Method: Stylet and Oral airway Placement Confirmation: ETT inserted through vocal cords under direct vision, positive ETCO2 and breath sounds checked- equal and bilateral Secured at: 20 cm Tube secured with: Tape Dental Injury: Teeth and Oropharynx as per pre-operative assessment

## 2023-05-25 NOTE — Hospital Course (Addendum)
Hospital course / significant events:   HPI:  Denise Watts is a 68 y.o. female with medical history significant of  multiple cancer including vocal cord squamous cell carcinoma, esophageal stricture, breast cancer, adrenal carcinoma, non-small cell lung cancer (currently is on chemotherapy for lung cancer), HLD, COPD, CAD, dCHF, depression with anxiety, A-fib,. PE and DVT on Lovenox, mycoplasma pneumonia, who presents with dysphagia. Pt has hx of vocal cord squamous cell carcinoma and esophageal stricture.  States that her difficulty swallowing has been progressively worsening recently. She feels okay for swallowing liquids and water, but not solid food. She is direct admit w/ plan for feeding tube placement.   01/22: admitted to hospitalist service w/ general surgery consult 01/23: placement G-tube, plan for tube feeds tomorrow, heparin gtt tomorrow (recent hx PE) 01/24: ok to use G-tube, continue heparin today and start lovenox tomorrow. If tolerating tube feeds expect discharge tomorrow AM  01/25: significant abd pain / distension this morning. KUB/CT notable for intraluminal gas, some peritoneal air c/w surgery, no obvious obstruction. Concern for ileus. Gtube to gravity. General surgery following. Question aspiration but CXR appears normal except known nodules, abn lung sounds seem related to bronchial, no desaturation, suspect possible aspiration pneumonitis  01/26: Per surgery, ok to restart tube feeds slowly, reengaged w/ RD to assist. Pt still uncomfortable and hasn't had BM, trial suppository/enema, will see if tolerates tube feeds before giving enteral laxatives. Pt encouraged to get OOB to chair / walk if able but she states she is probably not able d/t pain. Will repeat KUB. Will also repeat CXR, concern possible aspiration given coughing, no wheezing, lungs sound better today overall  01/27: finally had BM today, increasing tube feeds later today/tomorrow and work on ambulating.  Patient  reports feeling significantly better after BM 01/28: We are hopeful for DC today however recurrent severe abdominal pain, KUB repeat, appears similar to 01/26.  Repeat suppository, if/when BM, continue bowel regimen and patient really needs to ambulate 06-05-2023 Discussed with oncology. Pt can go on Eliquis. She has a PEG now. Was only on lovenox because she could not swallow Eliquis pill.   Consultants:  General surgery   Procedures/Surgeries: 05/25/23 G tube placed - Dr Everlene Farrier

## 2023-05-26 ENCOUNTER — Ambulatory Visit: Payer: Medicare HMO

## 2023-05-26 ENCOUNTER — Encounter: Payer: Self-pay | Admitting: Surgery

## 2023-05-26 DIAGNOSIS — E43 Unspecified severe protein-calorie malnutrition: Secondary | ICD-10-CM | POA: Diagnosis not present

## 2023-05-26 DIAGNOSIS — R1310 Dysphagia, unspecified: Secondary | ICD-10-CM | POA: Diagnosis not present

## 2023-05-26 DIAGNOSIS — C3490 Malignant neoplasm of unspecified part of unspecified bronchus or lung: Secondary | ICD-10-CM | POA: Diagnosis not present

## 2023-05-26 DIAGNOSIS — C320 Malignant neoplasm of glottis: Secondary | ICD-10-CM | POA: Diagnosis not present

## 2023-05-26 DIAGNOSIS — E440 Moderate protein-calorie malnutrition: Secondary | ICD-10-CM | POA: Insufficient documentation

## 2023-05-26 LAB — CBC
HCT: 29.8 % — ABNORMAL LOW (ref 36.0–46.0)
HCT: 30.3 % — ABNORMAL LOW (ref 36.0–46.0)
Hemoglobin: 9.3 g/dL — ABNORMAL LOW (ref 12.0–15.0)
Hemoglobin: 9.6 g/dL — ABNORMAL LOW (ref 12.0–15.0)
MCH: 32.6 pg (ref 26.0–34.0)
MCH: 32.4 pg (ref 26.0–34.0)
MCHC: 31.2 g/dL (ref 30.0–36.0)
MCHC: 31.7 g/dL (ref 30.0–36.0)
MCV: 104.6 fL — ABNORMAL HIGH (ref 80.0–100.0)
MCV: 102.4 fL — ABNORMAL HIGH (ref 80.0–100.0)
Platelets: 162 10*3/uL (ref 150–400)
Platelets: 199 10*3/uL (ref 150–400)
RBC: 2.85 MIL/uL — ABNORMAL LOW (ref 3.87–5.11)
RBC: 2.96 MIL/uL — ABNORMAL LOW (ref 3.87–5.11)
RDW: 15.5 % (ref 11.5–15.5)
RDW: 15.6 % — ABNORMAL HIGH (ref 11.5–15.5)
WBC: 5.4 10*3/uL (ref 4.0–10.5)
WBC: 6.7 10*3/uL (ref 4.0–10.5)
nRBC: 0 % (ref 0.0–0.2)
nRBC: 0 % (ref 0.0–0.2)

## 2023-05-26 LAB — PHOSPHORUS
Phosphorus: 3.1 mg/dL (ref 2.5–4.6)
Phosphorus: 2.6 mg/dL (ref 2.5–4.6)

## 2023-05-26 LAB — GLUCOSE, CAPILLARY
Glucose-Capillary: 71 mg/dL (ref 70–99)
Glucose-Capillary: 144 mg/dL — ABNORMAL HIGH (ref 70–99)

## 2023-05-26 LAB — COMPREHENSIVE METABOLIC PANEL
ALT: 10 U/L (ref 0–44)
AST: 23 U/L (ref 15–41)
Albumin: 3 g/dL — ABNORMAL LOW (ref 3.5–5.0)
Alkaline Phosphatase: 41 U/L (ref 38–126)
Anion gap: 12 (ref 5–15)
BUN: <5 mg/dL — ABNORMAL LOW (ref 8–23)
CO2: 25 mmol/L (ref 22–32)
Calcium: 8.6 mg/dL — ABNORMAL LOW (ref 8.9–10.3)
Chloride: 102 mmol/L (ref 98–111)
Creatinine, Ser: 0.72 mg/dL (ref 0.44–1.00)
GFR, Estimated: >60 mL/min (ref >60)
Glucose, Bld: 141 mg/dL — ABNORMAL HIGH (ref 70–99)
Potassium: 4 mmol/L (ref 3.5–5.1)
Sodium: 139 mmol/L (ref 135–145)
Total Bilirubin: 0.5 mg/dL (ref 0.0–1.2)
Total Protein: 5 g/dL — ABNORMAL LOW (ref 6.5–8.1)

## 2023-05-26 LAB — HEPARIN LEVEL (UNFRACTIONATED)
Heparin Unfractionated: 0.21 [IU]/mL — ABNORMAL LOW (ref 0.30–0.70)
Heparin Unfractionated: 0.37 [IU]/mL (ref 0.30–0.70)

## 2023-05-26 LAB — MAGNESIUM
Magnesium: 1.9 mg/dL (ref 1.7–2.4)
Magnesium: 1.9 mg/dL (ref 1.7–2.4)

## 2023-05-26 MED ORDER — PIVOT 1.5 CAL PO LIQD: 1000.0000 mL | ORAL | Status: DC

## 2023-05-26 MED ORDER — FREE WATER
60.0000 mL | Freq: Every day | Status: DC
  Administered 2023-05-26 – 2023-06-05 (×43): 60 mL

## 2023-05-26 MED ORDER — THIAMINE MONONITRATE 100 MG PO TABS
100.0000 mg | ORAL_TABLET | Freq: Every day | ORAL | Status: DC
  Administered 2023-05-26 – 2023-05-28 (×3): 100 mg
  Filled 2023-05-26 (×3): qty 1

## 2023-05-26 MED ORDER — OSMOLITE 1.5 CAL PO LIQD
119.0000 mL | Freq: Every day | ORAL | Status: AC
  Administered 2023-05-26 (×4): 119 mL

## 2023-05-26 MED ORDER — HEPARIN (PORCINE) 25000 UT/250ML-% IV SOLN
650.0000 [IU]/h | INTRAVENOUS | Status: DC
  Administered 2023-05-26: 550 [IU]/h via INTRAVENOUS

## 2023-05-26 MED ORDER — ADULT MULTIVITAMIN W/MINERALS CH
1.0000 | ORAL_TABLET | Freq: Every day | ORAL | Status: DC
  Administered 2023-05-26 – 2023-05-28 (×3): 1
  Filled 2023-05-26 (×3): qty 1

## 2023-05-26 MED ORDER — BENZONATATE 100 MG PO CAPS
200.0000 mg | ORAL_CAPSULE | Freq: Three times a day (TID) | ORAL | Status: DC
  Administered 2023-05-26 – 2023-05-30 (×14): 200 mg via ORAL
  Filled 2023-05-26 (×15): qty 2

## 2023-05-26 MED ORDER — OSMOLITE 1.5 CAL PO LIQD
237.0000 mL | Freq: Every day | ORAL | Status: DC
  Administered 2023-05-27 (×2): 237 mL

## 2023-05-26 MED ORDER — HEPARIN BOLUS VIA INFUSION
900.0000 [IU] | Freq: Once | INTRAVENOUS | Status: AC
  Administered 2023-05-26: 900 [IU] via INTRAVENOUS
  Filled 2023-05-26: qty 900

## 2023-05-26 NOTE — Progress Notes (Signed)
PROGRESS NOTE    Denise Watts   ZOX:[id number] DOB: November 19, 1955  DOA: 05/24/2023 Date of Service: 05/26/23 which is hospital day 2  PCP: Sherlyn Hay, DO    Hospital course / significant events:   HPI:  Denise Watts is a 68 y.o. female with medical history significant of  multiple cancer including vocal cord squamous cell carcinoma, esophageal stricture, breast cancer, adrenal carcinoma, non-small cell lung cancer (currently is on chemotherapy for lung cancer), HLD, COPD, CAD, dCHF, depression with anxiety, A-fib,. PE and DVT on Lovenox, mycoplasma pneumonia, who presents with dysphagia. Pt has hx of vocal cord squamous cell carcinoma and esophageal stricture.  States that her difficulty swallowing has been progressively worsening recently. She feels okay for swallowing liquids and water, but not solid food. She is direct admit w/ plan for feeding tube placement.   01/22: admitted to hospitalist service w/ general surgery consult 01/23: placement G-tube, plan for tube feeds tomorrow, heparin gtt tomorrow (recent hx PE) 01/24: ok to use G-tube, continue heparin today and start lovenox tomorrow      Consultants:  General surgery   Procedures/Surgeries: 05/25/23 G tube placed - Dr Everlene Farrier      ASSESSMENT & PLAN:  Dysphagia due to esophageal stricture secondary to history of vocal cord squamous cell carcinoma: POD1 S/p G tube placement Can use G-tube as of this morning per general surgery  Dietary to follow for tube feed initiation   Hx of DVT (deep venous thrombosis) and hx of pulmonary embolus Continue heparin for now Restart home lovenox tomorrow    PAF (paroxysmal atrial fibrillation)  Heart rate 87 Metoprolol held d/t soft BP Continue heparin for now Restart home lovenox tomorrow    COPD (chronic obstructive pulmonary disease) (HCC): Stable bronchodilators and as needed Mucinex   Chronic diastolic CHF (congestive heart failure) (HCC): Patient has leg edema, but  BNP normal 73.7, no oxygen desaturation.  Clinically does not seem to have CHF exacerbation. watch volume status closely   HLD (hyperlipidemia) Crestor   CAD (coronary artery disease) Crestor   Non-small cell lung cancer (HCC):  On chemotherapy, last dose was 2 weeks ago. Follow-up with Dr. Rana Snare   Depression with anxiety Current new home [place]        DVT prophylaxis: heparin  IV fluids: no continuous IV fluids  Nutrition: tube feeds to start today  Central lines / invasive devices: G tube, port  Code Status: FULL CODE ACP documentation reviewed:  none on file in [facility]  TOC needs: TBD Barriers to dispo / significant pending items: start tube feeds, on heparin, anticipate discharge in 1-2 days / cleared by surgery              Subjective / Brief ROS:  Patient reports no concerns at this time  Denies CP/SOB.  Pain controlled Tube feeds to be started this afternoon Denies new weakness.  Family Communication: none at this time     Objective Findings:  Vitals:   05/25/23 1955 05/26/23 0341 05/26/23 0403 05/26/23 0740  BP: 118/66 100/64  121/75  Pulse: 91 85  83  Resp: 17 17  16   Temp: 97.9 F (36.6 C) 97.6 F (36.4 C)  98.1 F (36.7 C)  TempSrc:    Oral  SpO2: 95% 92%  93%  Weight:   55.8 kg   Height:        Intake/Output Summary (Last 24 hours) at 05/26/2023 0830 Last data filed at 05/26/2023 8119 Gross per 24 hour  Intake 3703.45 ml  Output 810 ml  Net 2893.45 ml   Filed Weights   05/24/23 2050 05/25/23 0354 05/26/23 0403  Weight: 51.1 kg 51.1 kg 55.8 kg    Examination:  Physical Exam Constitutional:      General: She is not in acute distress. Cardiovascular:     Rate and Rhythm: Normal rate and regular rhythm.  Pulmonary:     Effort: Pulmonary effort is normal.     Breath sounds: Normal breath sounds.  Abdominal:     Palpations: Abdomen is soft.  Neurological:     Mental Status: She is alert. Mental status is at baseline.   Psychiatric:        Mood and Affect: Mood normal.          Scheduled Medications:   Chlorhexidine Gluconate Cloth  6 each Topical Q0600   fentaNYL  1 patch Transdermal Q72H   omeprazole  40 mg Oral Daily   rOPINIRole  0.25 mg Oral QHS   rosuvastatin  40 mg Oral Daily   sodium chloride flush  10-40 mL Intracatheter Q12H    Continuous Infusions:  acetaminophen Stopped (05/26/23 0625)   cefoTEtan (CEFOTAN) IV Stopped (05/25/23 2233)   heparin 550 Units/hr (05/26/23 0626)   lactated ringers Stopped (05/26/23 0228)    PRN Medications:  albuterol, dextromethorphan-guaiFENesin, hydrALAZINE, morphine injection, ondansetron (ZOFRAN) IV, oxyCODONE  Antimicrobials from admission:  Anti-infectives (From admission, onward)    Start     Dose/Rate Route Frequency Ordered Stop   05/25/23 2200  cefoTEtan (CEFOTAN) 2 g in sodium chloride 0.9 % 100 mL IVPB        2 g 200 mL/hr over 30 Minutes Intravenous Every 12 hours 05/25/23 1923 05/26/23 2159   05/25/23 1200  cefoTEtan (CEFOTAN) 2 g in sodium chloride 0.9 % 100 mL IVPB        2 g 200 mL/hr over 30 Minutes Intravenous  Once 05/24/23 1850 05/25/23 1905           Data Reviewed:  I have personally reviewed the following...  CBC: Recent Labs  Lab 05/22/23 1140 05/24/23 1903 05/25/23 0645 05/26/23 0533  WBC 3.8* 4.2 2.6* 5.4  NEUTROABS 2.4  --   --   --   HGB 10.2* 11.1* 9.5* 9.3*  HCT 31.5* 35.0* 30.2* 29.8*  MCV 100.3* 103.9* 103.8* 104.6*  PLT 255 248 172 162   Basic Metabolic Panel: Recent Labs  Lab 05/22/23 1140 05/24/23 1903 05/25/23 0645 05/26/23 0533  NA 134* 138 139 139  K 3.3* 4.0 3.3* 4.0  CL 99 98 103 102  CO2 25 27 28 25   GLUCOSE 84 100* 94 141*  BUN 10 8 <5* <5*  CREATININE 0.74 0.69 0.63 0.72  CALCIUM 8.7* 9.0 8.4* 8.6*  MG  --  2.2  --   --   PHOS  --  3.3  --   --    GFR: Estimated Creatinine Clearance: 50.1 mL/min (by C-G formula based on SCr of 0.72 mg/dL). Liver Function  Tests: Recent Labs  Lab 05/22/23 1140 05/26/23 0533  AST 23 23  ALT 12 10  ALKPHOS 60 41  BILITOT 0.7 0.5  PROT 5.9* 5.0*  ALBUMIN 3.1* 3.0*   No results for input(s): "LIPASE", "AMYLASE" in the last 168 hours. No results for input(s): "AMMONIA" in the last 168 hours. Coagulation Profile: Recent Labs  Lab 05/24/23 1903  INR 1.1   Cardiac Enzymes: No results for input(s): "CKTOTAL", "CKMB", "CKMBINDEX", "TROPONINI" in the last  168 hours. BNP (last 3 results) No results for input(s): "PROBNP" in the last 8760 hours. HbA1C: No results for input(s): "HGBA1C" in the last 72 hours. CBG: No results for input(s): "GLUCAP" in the last 168 hours. Lipid Profile: No results for input(s): "CHOL", "HDL", "LDLCALC", "TRIG", "CHOLHDL", "LDLDIRECT" in the last 72 hours. Thyroid Function Tests: No results for input(s): "TSH", "T4TOTAL", "FREET4", "T3FREE", "THYROIDAB" in the last 72 hours. Anemia Panel: No results for input(s): "VITAMINB12", "FOLATE", "FERRITIN", "TIBC", "IRON", "RETICCTPCT" in the last 72 hours. Most Recent Urinalysis On File:     Component Value Date/Time   COLORURINE YELLOW (A) 05/10/2023 1629   APPEARANCEUR CLEAR (A) 05/10/2023 1629   APPEARANCEUR Clear 02/27/2023 0959   LABSPEC 1.013 05/10/2023 1629   PHURINE 5.0 05/10/2023 1629   GLUCOSEU 50 (A) 05/10/2023 1629   HGBUR NEGATIVE 05/10/2023 1629   BILIRUBINUR NEGATIVE 05/10/2023 1629   BILIRUBINUR Negative 02/27/2023 0959   KETONESUR 5 (A) 05/10/2023 1629   PROTEINUR NEGATIVE 05/10/2023 1629   UROBILINOGEN 0.2 11/05/2021 1736   NITRITE NEGATIVE 05/10/2023 1629   LEUKOCYTESUR NEGATIVE 05/10/2023 1629   Sepsis Labs: @LABRCNTIP (procalcitonin:4,lacticidven:4) Microbiology: No results found for this or any previous visit (from the past 240 hours).    Radiology Studies last 3 days: No results found.       Sunnie Nielsen, DO [facility] 05/26/2023, 8:30 AM    Dictation software may have been  used to generate the above note. Typos may occur and escape review in typed/dictated notes. Please contact Dr Lyn Hollingshead directly for clarity if needed.  Staff may message me via secure chat in Epic  but this may not receive an immediate response,  please page me for urgent matters!  If 7PM-7AM, please contact night coverage [email or web address]

## 2023-05-26 NOTE — Progress Notes (Signed)
Sent lab tubes (red, lav, light green, blue) needed for 1500 cbc and heparin and 1700 mag and phos via tube station number 22 for RN to draw from implanted right chest port.

## 2023-05-26 NOTE — Discharge Instructions (Addendum)
Home tube feeding regimen:  1 carton (250 ml) Nutren 1.5 5 times daily  30 ml free water flush before and after each feeding administration  Tube feeding regimen provides 1900 kcals, 85 grams protein, and 1285 ml water daily

## 2023-05-26 NOTE — Plan of Care (Signed)

## 2023-05-26 NOTE — Progress Notes (Signed)
PHARMACY - ANTICOAGULATION CONSULT NOTE  Pharmacy Consult for hx of PE and A fib on lovenox, s/p feeding tube placement procedure  Indication: atrial fibrillation  Allergies  Allergen Reactions   Sulfa Antibiotics Other (See Comments)    Mouth sore and raw    Patient Measurements: Height: 5\' 1"  (154.9 cm) Weight: 55.8 kg (123 lb 0.3 oz) IBW/kg (Calculated) : 47.8 Heparin Dosing Weight: 50.1 kg  Vital Signs: Temp: 98.1 F (36.7 C) (01/24 0740) Temp Source: Oral (01/24 0740) BP: 114/64 (01/24 1149) Pulse Rate: 93 (01/24 1149)  Labs: Recent Labs    05/24/23 1903 05/25/23 0645 05/26/23 0533 05/26/23 1619  HGB 11.1* 9.5* 9.3* 9.6*  HCT 35.0* 30.2* 29.8* 30.3*  PLT 248 172 162 199  APTT 32  --   --   --   LABPROT 14.5  --   --   --   INR 1.1  --   --   --   HEPARINUNFRC  --  0.61  --  0.21*  CREATININE 0.69 0.63 0.72  --     Estimated Creatinine Clearance: 50.1 mL/min (by C-G formula based on SCr of 0.72 mg/dL).   Medical History: Past Medical History:  Diagnosis Date   Adenomatous colon polyp    Adrenal carcinoma, right (HCC) 12/28/2016   a.) high grade carcinoma with extensive necrosis --> s/p adrenalectomy   Anxiety    Aortic atherosclerosis (HCC)    Arthritis of right knee    Atrial fibrillation and flutter (HCC)    a.) CHA2DS2-VASc = 3 (age, sex, vascular disease history) as of 02/13/2023; b.) cardiac rate/rhythm maintained intrinsically without pharmacological intervention; no chronic OAC (does take low dose ASA)   B12 deficiency    Breast cancer (HCC) 1997   a.) stage IB (cT1a, cN0, cM0, G3, ER+, PR-, HER2-); s/p lumpectomy + XRT   COPD (chronic obstructive pulmonary disease) (HCC)    Coronary artery disease    a.) cCTA 12/09/2021: Ca2+ = 135 (80th %'ile for age/sex/race match contol) --> 25-49% pLAD and RCA distributions   Depression    Diastolic dysfunction    a.) TTE 12/11/2020, no RWMAs, norm RVSF, G1DD; b.) TTE 12/14/2021: EF 55-60, no RWMAs, norm  RVSF, G1DD; c.) TTE 01/27/2023: EF 50-55%, no RWMAs, G1DD, mild RAE, mild MR, mod TR   Dry cough 03/02/2017   Dyspnea    Full dentures    GERD (gastroesophageal reflux disease)    History of hiatal hernia    HLD (hyperlipidemia)    Invasive carcinoma of breast (HCC) 11/15/2017   a.) path (+) for G3, ER -, PR -, HER2/neu - ; s/p lumpectomy 01/17/2018 + 4 cycles TC systemic chemotherapy + XRT   Long term current use of aspirin    Personal history of chemotherapy    Personal history of radiation therapy    Pneumothorax of left lung after biopsy 01/25/2023   a.) hypoxic in PACU requiring re-intubation --> transferred to ICU --> chest tube placed by PCCM   RLS (restless legs syndrome)    a.) on ropinirole   Skin cancer    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   a.) Stage IB (cT1a, cN0, cM0, G3, ER+, PR-, HER2-)    Medications:  Scheduled:   benzonatate  200 mg Oral TID   Chlorhexidine Gluconate Cloth  6 each Topical Q0600   feeding supplement (OSMOLITE 1.5 CAL)  119 mL Per Tube 5 X Daily   [START ON 05/27/2023] feeding supplement (OSMOLITE 1.5 CAL)  237 mL Per Tube 5 X Daily   fentaNYL  1 patch Transdermal Q72H   free water  60 mL Per Tube 5 X Daily   multivitamin with minerals  1 tablet Per Tube Daily   omeprazole  40 mg Oral Daily   rOPINIRole  0.25 mg Oral QHS   rosuvastatin  40 mg Oral Daily   sodium chloride flush  10-40 mL Intracatheter Q12H   thiamine  100 mg Per Tube Daily    Assessment: 68 year old female with past medical history significant for COPD, CAD, anxiety and depression, atrial fibrillation/flutter, HDS, RLS, dysphagia,  breast cancer sp surgery and radiation, carcinoma of the vocal cord sp surgery and radiation (subsequent dysphagia), adrenal carcinoma of unknown primary, now with non-small cell lung cancer now on chemotherapy and radiation therapy.   Patient taking chemo weekly for 6 cycles. On lovenox in setting of PE w/ last dose 01/22 at approximately  1100 per patient.    1/24 1619  HL 0.21  subtherapeutic  Goal of Therapy:  Heparin level 0.3-0.7 units/ml Monitor platelets by anticoagulation protocol: Yes   Plan:  1/24 1619  HL 0.21  subtherapeutic Heparin bolus of 900 units x 1 ---increase heparin infusion to 650 units/hr  ---Check heparin level in 6 hours after rate change ---Continue to monitor H&H and platelets -surgery note recommends restart PTA lovenox on 1/25- f/u  Mehdi Gironda A 05/26/2023,5:02 PM

## 2023-05-26 NOTE — Progress Notes (Addendum)
Initial Nutrition Assessment  DOCUMENTATION CODES:   Non-severe (moderate) malnutrition in context of chronic illness  INTERVENTION:   -TF via g-tube:   237 ml (1 carton) Osmolite 1.5 5 times daily  30 ml free water flush before and after each feeding administration  Tube feeding regimen provides 1775 kcal (100% of needs), 75 grams of protein, and 905 ml of H2O.  Total free water: 1205 ml daily  -MVI with minerals daily -100 mg thiamine daily -Monitor Mg, K, and Phos and replete as needed secondary to high refeeding risk  -Home TF regimen (discussed with [facility], TOC and ordered):   1 carton (250 ml) Nutren 1.5 5 times daily  30 ml free water flush before and after each feeding administration  Tube feeding regimen provides 1900 kcals, 85 grams protein, and 1285 ml water daily  -RD also forwarded [facility] for continuity of care  NUTRITION DIAGNOSIS:   Moderate Malnutrition related to chronic illness (vocal cord squamous cell carcinoma, COPD, CHF) as evidenced by mild fat depletion, moderate fat depletion, mild muscle depletion, moderate muscle depletion.  GOAL:   Patient will meet greater than or equal to 90% of their needs  MONITOR:   PO intake, TF tolerance  REASON FOR ASSESSMENT:   Consult Enteral/tube feeding initiation and management  ASSESSMENT:   Pt with medical history significant of  multiple cancer including vocal cord squamous cell carcinoma, esophageal stricture, breast cancer, adrenal carcinoma, non-small cell lung cancer (currently is on chemotherapy for lung cancer), HLD, COPD, CAD, dCHF, depression with anxiety, A-fib,. PE and DVT on Lovenox, mycoplasma pneumonia, who presents with dysphagia.  Pt admitted with dysphagia due to esophageal stricture secondary to history of vocal cord squamous cell carcinoma.   1/23- s/p g-tube placement  Reviewed I/O's: +2.9 L x 24 hours and +3.2 L since admission  UOP: 800 ml x 24 hours  Spoke  with pt at bedside, who reports feeling poorly today due to pain in her stomach. Nurse notified of concerned and administered pain medications.   Pt reports poor oral intake "for a long time" secondary to dysphagia as a result of esophageal stricture. Over the past few months, pt reports she has been drinking liquids only. Observed pt take medications, coffee, and water without difficulty. Pt shares she is able to swallow these, but intake is "not much". RD at [facility] has been following pt since early this month and states appetite has been poor; intake consisted of bites of solid food (hot dog and potatoes), chocolate milkshakes, and [employer] milk.   Per pt, she estimates that she has lost about 30# within the past 6 months. Reviewed wt hx; pt has experienced  7.5% wt loss over the past 6 months, which is not significant for time frame, however, concerning given pt's prolonged poor oral intake related to dysphagia and increased nutritional needs due to cancer treatments.   Given pt's poor oral intake, dysphagia, and malnutrition, pt will require enteral nutrition support help meet nutritional needs as pt is unable to meet nutritional needs solely but PO intake (given diet recall, suspect pt is only meeting approximately 25% of estimated energy intake consistently PO).   Discussed plan for TF and received permission to start TF today. Pt initially wary to start due to pain, but is willing to start after discussing importance.   Case discussed with RN, MD, and TOC; plan to discharge home on TF, likely discharge home tomorrow. Discussed home TF needs with team and  order home TF DME orders in preparation for discharge home. Discussed with nursing regarding educating pt about PEG feedings and care.   Case discussed with Jeri Modena of [facility]; reviewed home TF regimen with her as well as home TF supply needs. [facility] has recently switched to a different TF formula supplier and RD made adjustments to  home TF orders to reflect formulary substitutions that were in stock at their facility. Discharge needs and ordered updated. Per Pam, plan to educate pt on TF prior to discharge.   Given pt's malnutrition and history of poor oral intake, pt is at high risk for refeeding syndrome. RD will titrate TF slowly as well as add MVI and thiamine to help manage refeeding risk.   Labs reviewed: K, Mg, and Phos WDL. CBGS: 90 (inpatient orders for glycemic control are none).    NUTRITION - FOCUSED PHYSICAL EXAM:  Flowsheet Row Most Recent Value  Orbital Region Mild depletion  Upper Arm Region Moderate depletion  Thoracic and Lumbar Region Mild depletion  Buccal Region Mild depletion  Temple Region Moderate depletion  Clavicle Bone Region Mild depletion  Clavicle and Acromion Bone Region Mild depletion  Scapular Bone Region Mild depletion  Dorsal Hand Moderate depletion  Patellar Region Moderate depletion  Anterior Thigh Region Moderate depletion  Posterior Calf Region Moderate depletion  Edema (RD Assessment) Mild  Hair Reviewed  Eyes Reviewed  Mouth Reviewed  Skin Reviewed  Nails Reviewed       Diet Order:   Diet Order             Diet full liquid Fluid consistency: Thin  Diet effective now                   EDUCATION NEEDS:   Education needs have been addressed  Skin:  Skin Assessment: Skin Integrity Issues: Skin Integrity Issues:: Incisions Incisions: closed abdomen s/p g-tube placement  Last BM:  05/17/23  Height:   Ht Readings from Last 1 Encounters:  05/24/23 5\' 1"  (1.549 m)    Weight:   Wt Readings from Last 1 Encounters:  05/26/23 55.8 kg    Ideal Body Weight:  47.7 kg  BMI:  Body mass index is 23.24 kg/m.  Estimated Nutritional Needs:   Kcal:  1600-1800  Protein:  75-95 grams  Fluid:  > 1.6 L    Levada Schilling, RD, LDN, CDCES Registered Dietitian III Certified Diabetes Care and Education Specialist If unable to reach this RD, please use "RD  Inpatient" group chat on secure chat between hours of 8am-4 pm daily

## 2023-05-26 NOTE — TOC Progression Note (Signed)
Transition of Care [facility]) - Progression Note    Patient Details  Name: Denise Watts MRN: [id number] Date of Birth: [birth year]  [facility] CM/SW Contact  Garret Reddish, RN Phone Number: 05/26/2023, 3:59 PM  Clinical Narrative:    Chart reviewed.  Noted that patient was admitted for Dysphagia.  I have spoken with Dr. Lyn Hollingshead and she informs me that patient will need be a tentative discharge for tomorrow.  She informs me that patient has a new peg tube and will need peg tube teaching, tube feeding and supplies.    I have spoken with [name].  I have informed her that she will be going home with a peg tube and will need peg tube teaching and home health nursing to provide home health nursing for her at home. [name] did not have any home health preference.  Mrs. Blades reports that she lives at home with her husband and she is able to get around her home with a rolling walker.    I have asked Pam with [facility] to provide tube feeding( Osmolite 1.5) and tube feeding supplies.  Pam has came to teach patient at bedside today.  Patient and Pam where unable to reach patient's husband for teaching.  Pam did teach was able to provide teaching to the patient.    I have asked [place] with [facility] to provide home health for RN for new peg tube teaching and education.  [place] informs me that start of care will be Sunday.    TOC will continue to follow for discharge planning.          Expected Discharge Plan and Services                                               Social Determinants of Health (SDOH) Interventions SDOH Screenings   Food Insecurity: No Food Insecurity (05/24/2023)  Housing: Low Risk  (05/24/2023)  Transportation Needs: No Transportation Needs (05/24/2023)  Utilities: Not At Risk (05/24/2023)  Alcohol Screen: Low Risk  (05/19/2023)  Depression (PHQ2-9): Low Risk  (05/19/2023)  Recent Concern: Depression (PHQ2-9) - High Risk (03/10/2023)  Financial  Resource Strain: Low Risk  (05/19/2023)  Physical Activity: Inactive (05/19/2023)  Social Connections: Moderately Isolated (05/24/2023)  Stress: Stress Concern Present (05/19/2023)  Tobacco Use: Medium Risk (05/25/2023)  Health Literacy: Adequate Health Literacy (05/19/2023)    Readmission Risk Interventions     No data to display

## 2023-05-26 NOTE — Progress Notes (Signed)
[facility] SURGICAL PROGRESS NOTE  Hospital Day(s): 2.   Post op day(s): 1 Day Post-Op.   Interval History:  Patient seen and examined No acute events or new complaints overnight.  Patient reports she is doing well Incisional soreness No fever, chills, nausea, emesis WBC normal at 5.4K Hgb to 9.3 sCr - 0.72; UO - 800 ccs + unmeasured No electrolyte derangements  FLD; tolerating well  Vital signs in last 24 hours: [min-max] current  Temp:  [97.6 F (36.4 C)-98.9 F (37.2 C)] 98.1 F (36.7 C) (01/24 0740) Pulse Rate:  [72-99] 83 (01/24 0740) Resp:  [15-22] 16 (01/24 0740) BP: (97-123)/(55-76) 121/75 (01/24 0740) SpO2:  [88 %-96 %] 93 % (01/24 0740) Weight:  [55.8 kg] 55.8 kg (01/24 0403)     Height: 5\' 1"  (154.9 cm) Weight: 55.8 kg BMI (Calculated): 23.26   Intake/Output last 2 shifts:  01/23 0701 - 01/24 0700 In: 3703.5 [P.O.:840; I.V.:2213.5; IV Piggyback:650] Out: 810 [Urine:800; Blood:10]   Physical Exam:  Constitutional: alert, cooperative and no distress  Respiratory: breathing non-labored at rest  Cardiovascular: regular rate and sinus rhythm  Gastrointestinal: soft, incisional soreness expectedly, and non-distended. G-tube in LUQ; site CDI Integumentary: Midline mini laparotomy to the upper abdominal is CDI with dermabond  Labs:     Latest Ref Rng & Units 05/26/2023    5:33 AM 05/25/2023    6:45 AM 05/24/2023    7:03 PM  CBC  WBC 4.0 - 10.5 K/uL 5.4  2.6  4.2   Hemoglobin 12.0 - 15.0 g/dL 9.3  9.5  09.8   Hematocrit 36.0 - 46.0 % 29.8  30.2  35.0   Platelets 150 - 400 K/uL 162  172  248       Latest Ref Rng & Units 05/26/2023    5:33 AM 05/25/2023    6:45 AM 05/24/2023    7:03 PM  CMP  Glucose 70 - 99 mg/dL 119  94  147   BUN 8 - 23 mg/dL <5  <5  8   Creatinine 0.44 - 1.00 mg/dL 8.29  5.62  1.30   Sodium 135 - 145 mmol/L 139  139  138   Potassium 3.5 - 5.1 mmol/L 4.0  3.3  4.0   Chloride 98 - 111 mmol/L 102  103  98   CO2 22 - 32  mmol/L 25  28  27    Calcium 8.9 - 10.3 mg/dL 8.6  8.4  9.0   Total Protein 6.5 - 8.1 g/dL 5.0     Total Bilirubin 0.0 - 1.2 mg/dL 0.5     Alkaline Phos 38 - 126 U/L 41     AST 15 - 41 U/L 23     ALT 0 - 44 U/L 10        Imaging studies: No new pertinent imaging studies   Assessment/Plan:  68 y.o. female 1 Day Post-Op s/p gastrostomy tube placement for malnutrition, complicated by pertinent comorbidities including vocal cord squamous cell carcinoma, breast cancer, adrenal carcinoma, non-small cell lung cancer (currently is on chemotherapy for lung cancer), HLD, COPD, CAD, dCHF, depression with anxiety, A-fib and DVT/PE, mycoplasma pneumonia.   - May begin to utilize G-tube for feedings, supplementation, and medications as needed.   - Recommend restarting Lovenox tomorrow morning; Continue Heparin today  - Monitor abdominal examination - Monitor H&H; stable   - Pain control prn; Antiemetic prn - Okay to mobilize as tolerated - Further management per primary service  - General surgery will  sign off. Please call with questions/concerns. She can follow up in clinic in 2-3 weeks    All of the above findings and recommendations were discussed with the patient, and the medical team, and all of patient's questions were answered to her expressed satisfaction.  -- Lynden Oxford, PA-C [facility] 05/26/2023, 8:25 AM M-F: 7am - 4pm

## 2023-05-27 ENCOUNTER — Inpatient Hospital Stay: Payer: Medicare HMO

## 2023-05-27 DIAGNOSIS — R1310 Dysphagia, unspecified: Secondary | ICD-10-CM | POA: Diagnosis not present

## 2023-05-27 LAB — CBC
HCT: 31.7 % — ABNORMAL LOW (ref 36.0–46.0)
Hemoglobin: 10.1 g/dL — ABNORMAL LOW (ref 12.0–15.0)
MCH: 33.1 pg (ref 26.0–34.0)
MCHC: 31.9 g/dL (ref 30.0–36.0)
MCV: 103.9 fL — ABNORMAL HIGH (ref 80.0–100.0)
Platelets: 202 10*3/uL (ref 150–400)
RBC: 3.05 MIL/uL — ABNORMAL LOW (ref 3.87–5.11)
RDW: 15.6 % — ABNORMAL HIGH (ref 11.5–15.5)
WBC: 7.7 10*3/uL (ref 4.0–10.5)
nRBC: 0 % (ref 0.0–0.2)

## 2023-05-27 LAB — GLUCOSE, CAPILLARY
Glucose-Capillary: 91 mg/dL (ref 70–99)
Glucose-Capillary: 62 mg/dL — ABNORMAL LOW (ref 70–99)
Glucose-Capillary: 58 mg/dL — ABNORMAL LOW (ref 70–99)
Glucose-Capillary: 93 mg/dL (ref 70–99)
Glucose-Capillary: 92 mg/dL (ref 70–99)
Glucose-Capillary: 107 mg/dL — ABNORMAL HIGH (ref 70–99)
Glucose-Capillary: 70 mg/dL (ref 70–99)

## 2023-05-27 LAB — MAGNESIUM
Magnesium: 2.2 mg/dL (ref 1.7–2.4)
Magnesium: 2 mg/dL (ref 1.7–2.4)

## 2023-05-27 LAB — PHOSPHORUS
Phosphorus: 2.3 mg/dL — ABNORMAL LOW (ref 2.5–4.6)
Phosphorus: 2.3 mg/dL — ABNORMAL LOW (ref 2.5–4.6)

## 2023-05-27 LAB — HEPARIN LEVEL (UNFRACTIONATED): Heparin Unfractionated: 0.36 [IU]/mL (ref 0.30–0.70)

## 2023-05-27 MED ORDER — ENOXAPARIN SODIUM 60 MG/0.6ML IJ SOSY
52.0000 mg | PREFILLED_SYRINGE | Freq: Two times a day (BID) | INTRAMUSCULAR | Status: DC
  Administered 2023-05-27: 52 mg via SUBCUTANEOUS
  Filled 2023-05-27: qty 0.6

## 2023-05-27 MED ORDER — DEXTROSE IN LACTATED RINGERS 5 % IV SOLN: INTRAVENOUS | Status: AC

## 2023-05-27 MED ORDER — FLEET ENEMA RE ENEM: 1.0000 | ENEMA | Freq: Every day | RECTAL | Status: DC | PRN

## 2023-05-27 MED ORDER — BISACODYL 10 MG RE SUPP
10.0000 mg | Freq: Every day | RECTAL | Status: DC | PRN
  Administered 2023-05-29: 10 mg via RECTAL
  Filled 2023-05-27 (×2): qty 1

## 2023-05-27 MED ORDER — MORPHINE SULFATE (PF) 2 MG/ML IV SOLN
2.0000 mg | INTRAVENOUS | Status: DC | PRN
  Administered 2023-05-27 – 2023-05-31 (×16): 2 mg via INTRAVENOUS
  Filled 2023-05-27 (×16): qty 1

## 2023-05-27 MED ORDER — HEPARIN (PORCINE) 25000 UT/250ML-% IV SOLN
750.0000 [IU]/h | INTRAVENOUS | Status: DC
  Administered 2023-05-27 – 2023-05-29 (×2): 650 [IU]/h via INTRAVENOUS
  Filled 2023-05-27 (×2): qty 250

## 2023-05-27 NOTE — Progress Notes (Signed)
PROGRESS NOTE    Denise Watts   ZOX:[id number] DOB: 1955/06/14  DOA: 05/24/2023 Date of Service: 05/27/23 which is hospital day 3  PCP: Sherlyn Hay, DO    Hospital course / significant events:   HPI:  Denise Watts is a 68 y.o. female with medical history significant of  multiple cancer including vocal cord squamous cell carcinoma, esophageal stricture, breast cancer, adrenal carcinoma, non-small cell lung cancer (currently is on chemotherapy for lung cancer), HLD, COPD, CAD, dCHF, depression with anxiety, A-fib,. PE and DVT on Lovenox, mycoplasma pneumonia, who presents with dysphagia. Pt has hx of vocal cord squamous cell carcinoma and esophageal stricture.  States that her difficulty swallowing has been progressively worsening recently. She feels okay for swallowing liquids and water, but not solid food. She is direct admit w/ plan for feeding tube placement.   01/22: admitted to hospitalist service w/ general surgery consult 01/23: placement G-tube, plan for tube feeds tomorrow, heparin gtt tomorrow (recent hx PE) 01/24: ok to use G-tube, continue heparin today and start lovenox tomorrow. If tolerating tube feeds expect discharge tomorrow AM  01/25: significant abd pain / distension this morning. KUB/CT notable for intraluminal gas, some peritoneal air c/w surgery, no obvious obstruction. Concern for ileus. Gtube to gravity. General surgery following. Question aspiration but CXR appears normal except known nodules, abn lung sounds seem related to bronchial,no desaturation, suspect possible aspiration pneumonitis      Consultants:  General surgery   Procedures/Surgeries: 05/25/23 G tube placed - Dr Everlene Farrier      ASSESSMENT & PLAN:  Dysphagia due to esophageal stricture secondary to history of vocal cord squamous cell carcinoma: POD2 S/p G tube placement Holding Gtube use for now given abdominal pain / distension / concern for ileus  Abdominal pain / distension  Concern for  ileus  No apparent complication from G-Tube placement  G-tube to gravity will hopefully relieve some pressure  General surgery following   Hx of DVT (deep venous thrombosis) and hx of pulmonary embolus Holding lovenox, back on heparin in case needing to go back to the OR    PAF (paroxysmal atrial fibrillation)  Heart rate 87 Metoprolol held d/t soft BP Continue heparin for now Restart home lovenox as able     COPD (chronic obstructive pulmonary disease) (HCC): Stable bronchodilators and as needed Mucinex   Chronic diastolic CHF (congestive heart failure) [facility]): Patient has leg edema, but BNP normal 73.7, no oxygen desaturation.  Clinically does not seem to have CHF exacerbation. watch volume status closely   HLD (hyperlipidemia) Crestor   CAD (coronary artery disease) Crestor   Non-small cell lung cancer  On chemotherapy, last dose was 2 weeks ago. Follow-up with Dr. Rana Snare   Depression with anxiety Current new home [place]        DVT prophylaxis: heparin  IV fluids: no continuous IV fluids  Nutrition: tube feeds to start today  Central lines / invasive devices: G tube, port  Code Status: FULL CODE ACP documentation reviewed:  none on file in [facility]  TOC needs: TBD Barriers to dispo / significant pending items: start tube feeds, on heparin, anticipate discharge in 1-2 days / cleared by surgery              Subjective / Brief ROS:  Patient reports abdominal pain, nausea No shortness of breath  Denies CP/SOB.  Denies new weakness.  Family Communication: husband at bedside     Objective Findings:  Vitals:   05/27/23 0344 05/27/23  0440 05/27/23 0913 05/27/23 1121  BP:  119/66 109/66 98/60  Pulse:   (!) 106 (!) 112  Resp:  18 15 15   Temp:  98.4 F (36.9 C) 97.9 F (36.6 C) (!) 97.5 F (36.4 C)  TempSrc:      SpO2:  92% 93% 92%  Weight: 56.2 kg     Height:        Intake/Output Summary (Last 24 hours) at 05/27/2023 1605 Last data filed at  05/27/2023 1426 Gross per 24 hour  Intake 414.28 ml  Output 250 ml  Net 164.28 ml   Filed Weights   05/25/23 0354 05/26/23 0403 05/27/23 0344  Weight: 51.1 kg 55.8 kg 56.2 kg    Examination:  Physical Exam Constitutional:      General: She is not in acute distress.    Appearance: She is ill-appearing.  Cardiovascular:     Rate and Rhythm: Normal rate and regular rhythm.  Pulmonary:     Effort: Pulmonary effort is normal. No respiratory distress.     Comments: Rhonchorous / bronchial breath sounds  Abdominal:     General: Bowel sounds are normal. There is distension.     Tenderness: There is abdominal tenderness. There is no rebound.  Musculoskeletal:     Right lower leg: No edema.     Left lower leg: No edema.  Skin:    General: Skin is warm and dry.  Neurological:     Mental Status: She is alert. Mental status is at baseline.  Psychiatric:        Mood and Affect: Mood normal.          Scheduled Medications:   benzonatate  200 mg Oral TID   Chlorhexidine Gluconate Cloth  6 each Topical Q0600   feeding supplement (OSMOLITE 1.5 CAL)  237 mL Per Tube 5 X Daily   fentaNYL  1 patch Transdermal Q72H   free water  60 mL Per Tube 5 X Daily   multivitamin with minerals  1 tablet Per Tube Daily   omeprazole  40 mg Oral Daily   rOPINIRole  0.25 mg Oral QHS   rosuvastatin  40 mg Oral Daily   sodium chloride flush  10-40 mL Intracatheter Q12H   thiamine  100 mg Per Tube Daily    Continuous Infusions:    PRN Medications:  albuterol, dextromethorphan-guaiFENesin, hydrALAZINE, morphine injection, ondansetron (ZOFRAN) IV, oxyCODONE  Antimicrobials from admission:  Anti-infectives (From admission, onward)    Start     Dose/Rate Route Frequency Ordered Stop   05/25/23 2200  cefoTEtan (CEFOTAN) 2 g in sodium chloride 0.9 % 100 mL IVPB        2 g 200 mL/hr over 30 Minutes Intravenous Every 12 hours 05/25/23 1923 05/26/23 1910   05/25/23 1200  cefoTEtan (CEFOTAN) 2 g in  sodium chloride 0.9 % 100 mL IVPB        2 g 200 mL/hr over 30 Minutes Intravenous  Once 05/24/23 1850 05/25/23 1905           Data Reviewed:  I have personally reviewed the following...  CBC: Recent Labs  Lab 05/22/23 1140 05/24/23 1903 05/25/23 0645 05/26/23 0533 05/26/23 1619 05/27/23 0525  WBC 3.8* 4.2 2.6* 5.4 6.7 7.7  NEUTROABS 2.4  --   --   --   --   --   HGB 10.2* 11.1* 9.5* 9.3* 9.6* 10.1*  HCT 31.5* 35.0* 30.2* 29.8* 30.3* 31.7*  MCV 100.3* 103.9* 103.8* 104.6* 102.4* 103.9*  PLT  255 248 172 162 199 202   Basic Metabolic Panel: Recent Labs  Lab 05/22/23 1140 05/24/23 1903 05/25/23 0645 05/26/23 0533 05/26/23 1120 05/26/23 1619 05/27/23 0525  NA 134* 138 139 139  --   --   --   K 3.3* 4.0 3.3* 4.0  --   --   --   CL 99 98 103 102  --   --   --   CO2 25 27 28 25   --   --   --   GLUCOSE 84 100* 94 141*  --   --   --   BUN 10 8 <5* <5*  --   --   --   CREATININE 0.74 0.69 0.63 0.72  --   --   --   CALCIUM 8.7* 9.0 8.4* 8.6*  --   --   --   MG  --  2.2  --   --  1.9 1.9 2.2  PHOS  --  3.3  --   --  3.1 2.6 2.3*   GFR: Estimated Creatinine Clearance: 50.1 mL/min (by C-G formula based on SCr of 0.72 mg/dL). Liver Function Tests: Recent Labs  Lab 05/22/23 1140 05/26/23 0533  AST 23 23  ALT 12 10  ALKPHOS 60 41  BILITOT 0.7 0.5  PROT 5.9* 5.0*  ALBUMIN 3.1* 3.0*   No results for input(s): "LIPASE", "AMYLASE" in the last 168 hours. No results for input(s): "AMMONIA" in the last 168 hours. Coagulation Profile: Recent Labs  Lab 05/24/23 1903  INR 1.1   Cardiac Enzymes: No results for input(s): "CKTOTAL", "CKMB", "CKMBINDEX", "TROPONINI" in the last 168 hours. BNP (last 3 results) No results for input(s): "PROBNP" in the last 8760 hours. HbA1C: No results for input(s): "HGBA1C" in the last 72 hours. CBG: Recent Labs  Lab 05/27/23 0453 05/27/23 0530 05/27/23 0551 05/27/23 0915 05/27/23 1121  GLUCAP 62* 58* 93 92 107*   Lipid  Profile: No results for input(s): "CHOL", "HDL", "LDLCALC", "TRIG", "CHOLHDL", "LDLDIRECT" in the last 72 hours. Thyroid Function Tests: No results for input(s): "TSH", "T4TOTAL", "FREET4", "T3FREE", "THYROIDAB" in the last 72 hours. Anemia Panel: No results for input(s): "VITAMINB12", "FOLATE", "FERRITIN", "TIBC", "IRON", "RETICCTPCT" in the last 72 hours. Most Recent Urinalysis On File:     Component Value Date/Time   COLORURINE YELLOW (A) 05/10/2023 1629   APPEARANCEUR CLEAR (A) 05/10/2023 1629   APPEARANCEUR Clear 02/27/2023 0959   LABSPEC 1.013 05/10/2023 1629   PHURINE 5.0 05/10/2023 1629   GLUCOSEU 50 (A) 05/10/2023 1629   HGBUR NEGATIVE 05/10/2023 1629   BILIRUBINUR NEGATIVE 05/10/2023 1629   BILIRUBINUR Negative 02/27/2023 0959   KETONESUR 5 (A) 05/10/2023 1629   PROTEINUR NEGATIVE 05/10/2023 1629   UROBILINOGEN 0.2 11/05/2021 1736   NITRITE NEGATIVE 05/10/2023 1629   LEUKOCYTESUR NEGATIVE 05/10/2023 1629   Sepsis Labs: @LABRCNTIP (procalcitonin:4,lacticidven:4) Microbiology: No results found for this or any previous visit (from the past 240 hours).    Radiology Studies last 3 days: CT ABDOMEN PELVIS WO CONTRAST Result Date: 05/27/2023 CLINICAL DATA:  Abdominal pain. Recent gastrostomy tube placement. Lung carcinoma. * Tracking Code: BO * EXAM: CT ABDOMEN AND PELVIS WITHOUT CONTRAST TECHNIQUE: Multidetector CT imaging of the abdomen and pelvis was performed following the standard protocol without IV contrast. RADIATION DOSE REDUCTION: This exam was performed according to the departmental dose-optimization program which includes automated exposure control, adjustment of the mA and/or kV according to patient size and/or use of iterative reconstruction technique. COMPARISON:  12/09/2021 FINDINGS: Lower chest:  Tiny bilateral pleural effusions. Hepatobiliary: No mass visualized on this unenhanced exam. Gallbladder is unremarkable. No evidence of biliary ductal dilatation. Pancreas:  No mass or inflammatory process visualized on this unenhanced exam. Spleen:  Within normal limits in size. Adrenals/Urinary tract: No evidence of urolithiasis or hydronephrosis. Unremarkable unopacified urinary bladder. Stomach/Bowel: Gastrostomy tube is seen appropriate position in the gastric body. Tiny amount of free intraperitoneal air as well as gas within the anterior abdominal wall adjacent to the gastrostomy is attributable to recent gastrostomy tube placement. No evidence of bowel obstruction, inflammatory process, or abnormal fluid collections. Diverticulosis is seen mainly involving the sigmoid colon, however there is no evidence of diverticulitis. Vascular/Lymphatic: No pathologically enlarged lymph nodes identified. No evidence of abdominal aortic aneurysm. Reproductive:  No mass or other significant abnormality. Other:  None. Musculoskeletal:  No suspicious bone lesions identified. IMPRESSION: Gastrostomy tube in appropriate position. Tiny amount of free intraperitoneal air and gas within the anterior abdominal wall, attributable to recent gastrostomy tube placement. No other acute findings. Colonic diverticulosis, without radiographic evidence of diverticulitis. Electronically Signed   By: Danae Orleans M.D.   On: 05/27/2023 13:19   DG Abd 1 View Result Date: 05/27/2023 CLINICAL DATA:  Abdominal pain and distension. EXAM: ABDOMEN - 1 VIEW COMPARISON:  01/25/2023 FINDINGS: Prominent gaseous distention of the stomach. Catheter overlies the left abdomen, potentially a gastrostomy tube. Gas dilated bowel loop overlying the right upper abdomen may reflect distended transverse colon. Diffuse gaseous small bowel distension noted. Gas and stool noted in the sigmoid colon with prominent gas in stool visible in the rectum. IMPRESSION: Prominent gaseous distention of the stomach with diffuse gaseous small bowel distension. Gas dilated bowel loop overlying the right upper abdomen may reflect distended  transverse colon. Findings likely reflect ileus. CT imaging could be used to more definitively evaluate. Electronically Signed   By: Kennith Center M.D.   On: 05/27/2023 11:10   DG Chest Port 1 View Result Date: 05/27/2023 CLINICAL DATA:  Shortness of breath.  Abnormal lung sounds. EXAM: PORTABLE CHEST 1 VIEW COMPARISON:  05/10/2023 FINDINGS: Scattered bilateral pulmonary nodules again noted. No focal consolidation or pulmonary edema. No substantial pleural effusion. The cardiopericardial silhouette is within normal limits for size. Right Port-A-Cath remains in place. No acute bony abnormality. IMPRESSION: Scattered bilateral pulmonary nodules again noted. No acute cardiopulmonary findings. Electronically Signed   By: Kennith Center M.D.   On: 05/27/2023 11:06         Sunnie Nielsen, DO [facility] 05/27/2023, 4:05 PM    Dictation software may have been used to generate the above note. Typos may occur and escape review in typed/dictated notes. Please contact Dr Lyn Hollingshead directly for clarity if needed.  Staff may message me via secure chat in Epic  but this may not receive an immediate response,  please page me for urgent matters!  If 7PM-7AM, please contact night coverage [email or web address]

## 2023-05-27 NOTE — Progress Notes (Signed)
CC: PEG tube placement Subjective: Called by per primary team due to concern of patient clinical status.  She has had increased work of breathing with more junky breath sounds.  There was concern for aspiration she has also been tachycardic.  On exam reports back pain.  Denies nausea or vomiting.  She is unsure if she has been passing any gas or bowel movements.  Objective: Vital signs in last 24 hours: Temp:  [97.5 F (36.4 C)-98.4 F (36.9 C)] 97.5 F (36.4 C) (01/25 1121) Pulse Rate:  [101-115] 112 (01/25 1121) Resp:  [15-18] 15 (01/25 1121) BP: (98-136)/(60-81) 98/60 (01/25 1121) SpO2:  [92 %-100 %] 92 % (01/25 1121) Weight:  [56.2 kg] 56.2 kg (01/25 0344) Last BM Date : 05/17/23 (PTA)  Intake/Output from previous day: 01/24 0701 - 01/25 0700 In: 414.3 [P.O.:120; I.V.:94.3; IV Piggyback:200] Out: -  Intake/Output this shift: No intake/output data recorded.  Physical exam:  [place] and appears much older than stated age, junky breath sounds bilaterally with some increased work of breathing, sinus tachycardia, abdomen is soft slightly distended G-tube in place without surrounding drainage from it.  Midline wound without erythema and surgical glue in place  Lab Results: CBC  Recent Labs    05/26/23 1619 05/27/23 0525  WBC 6.7 7.7  HGB 9.6* 10.1*  HCT 30.3* 31.7*  PLT 199 202   BMET Recent Labs    05/25/23 0645 05/26/23 0533  NA 139 139  K 3.3* 4.0  CL 103 102  CO2 28 25  GLUCOSE 94 141*  BUN <5* <5*  CREATININE 0.63 0.72  CALCIUM 8.4* 8.6*   PT/INR Recent Labs    05/24/23 1903  LABPROT 14.5  INR 1.1   ABG No results for input(s): "PHART", "HCO3" in the last 72 hours.  Invalid input(s): "PCO2", "PO2"  Studies/Results: CT ABDOMEN PELVIS WO CONTRAST Result Date: 05/27/2023 CLINICAL DATA:  Abdominal pain. Recent gastrostomy tube placement. Lung carcinoma. * Tracking Code: BO * EXAM: CT ABDOMEN AND PELVIS WITHOUT CONTRAST TECHNIQUE:  Multidetector CT imaging of the abdomen and pelvis was performed following the standard protocol without IV contrast. RADIATION DOSE REDUCTION: This exam was performed according to the departmental dose-optimization program which includes automated exposure control, adjustment of the mA and/or kV according to patient size and/or use of iterative reconstruction technique. COMPARISON:  12/09/2021 FINDINGS: Lower chest: Tiny bilateral pleural effusions. Hepatobiliary: No mass visualized on this unenhanced exam. Gallbladder is unremarkable. No evidence of biliary ductal dilatation. Pancreas: No mass or inflammatory process visualized on this unenhanced exam. Spleen:  Within normal limits in size. Adrenals/Urinary tract: No evidence of urolithiasis or hydronephrosis. Unremarkable unopacified urinary bladder. Stomach/Bowel: Gastrostomy tube is seen appropriate position in the gastric body. Tiny amount of free intraperitoneal air as well as gas within the anterior abdominal wall adjacent to the gastrostomy is attributable to recent gastrostomy tube placement. No evidence of bowel obstruction, inflammatory process, or abnormal fluid collections. Diverticulosis is seen mainly involving the sigmoid colon, however there is no evidence of diverticulitis. Vascular/Lymphatic: No pathologically enlarged lymph nodes identified. No evidence of abdominal aortic aneurysm. Reproductive:  No mass or other significant abnormality. Other:  None. Musculoskeletal:  No suspicious bone lesions identified. IMPRESSION: Gastrostomy tube in appropriate position. Tiny amount of free intraperitoneal air and gas within the anterior abdominal wall, attributable to recent gastrostomy tube placement. No other acute findings. Colonic diverticulosis, without radiographic evidence of diverticulitis. Electronically Signed   By: Danae Orleans M.D.   On: 05/27/2023 13:19  DG Abd 1 View Result Date: 05/27/2023 CLINICAL DATA:  Abdominal pain and distension.  EXAM: ABDOMEN - 1 VIEW COMPARISON:  01/25/2023 FINDINGS: Prominent gaseous distention of the stomach. Catheter overlies the left abdomen, potentially a gastrostomy tube. Gas dilated bowel loop overlying the right upper abdomen may reflect distended transverse colon. Diffuse gaseous small bowel distension noted. Gas and stool noted in the sigmoid colon with prominent gas in stool visible in the rectum. IMPRESSION: Prominent gaseous distention of the stomach with diffuse gaseous small bowel distension. Gas dilated bowel loop overlying the right upper abdomen may reflect distended transverse colon. Findings likely reflect ileus. CT imaging could be used to more definitively evaluate. Electronically Signed   By: Kennith Center M.D.   On: 05/27/2023 11:10   DG Chest Port 1 View Result Date: 05/27/2023 CLINICAL DATA:  Shortness of breath.  Abnormal lung sounds. EXAM: PORTABLE CHEST 1 VIEW COMPARISON:  05/10/2023 FINDINGS: Scattered bilateral pulmonary nodules again noted. No focal consolidation or pulmonary edema. No substantial pleural effusion. The cardiopericardial silhouette is within normal limits for size. Right Port-A-Cath remains in place. No acute bony abnormality. IMPRESSION: Scattered bilateral pulmonary nodules again noted. No acute cardiopulmonary findings. Electronically Signed   By: Kennith Center M.D.   On: 05/27/2023 11:06    Anti-infectives: Anti-infectives (From admission, onward)    Start     Dose/Rate Route Frequency Ordered Stop   05/25/23 2200  cefoTEtan (CEFOTAN) 2 g in sodium chloride 0.9 % 100 mL IVPB        2 g 200 mL/hr over 30 Minutes Intravenous Every 12 hours 05/25/23 1923 05/26/23 1910   05/25/23 1200  cefoTEtan (CEFOTAN) 2 g in sodium chloride 0.9 % 100 mL IVPB        2 g 200 mL/hr over 30 Minutes Intravenous  Once 05/24/23 1850 05/25/23 1905       Assessment/Plan:  Patient is postop day 2 from G-tube placement.  She does appear ill and has increased work of breathing  and there was concern for some aspiration pneumonia.  Abdominal x-ray shows some distended loops of bowel.  CT reviewed and the G-tube is in the correct position.  The bowel does not look very dilated on the CT.  However, in an abundance of caution given her abdominal x-ray recommend placing G-tube to gravity for now.  I do not think her change in clinical status is secondary to any complications from her G-tube placement.  We will assess her clinical status tomorrow to determine if we can restart feeds  Baker Pierini, M.D. [facility]

## 2023-05-27 NOTE — Progress Notes (Signed)
   05/26/23 2000  Assess: MEWS Score  Temp 97.9 F (36.6 C)  BP 114/69  MAP (mmHg) 80  Pulse Rate (!) 115  Resp 18  SpO2 100 %  O2 Device Room Air  Assess: MEWS Score  MEWS Temp 0  MEWS Systolic 0  MEWS Pulse 2  MEWS RR 0  MEWS LOC 0  MEWS Score 2  MEWS Score Color Yellow  Assess: if the MEWS score is Yellow or Red  Were vital signs accurate and taken at a resting state? No, vital signs rechecked  Does the patient meet 2 or more of the SIRS criteria? No  Assess: SIRS CRITERIA  SIRS Temperature  0  SIRS Respirations  0  SIRS Pulse 1  SIRS WBC 0  SIRS Score Sum  1    Pt had just ambulated to the bathroom, which elevated her heart rate

## 2023-05-27 NOTE — Plan of Care (Signed)
  Problem: Education: Goal: Knowledge of General Education information will improve Description: Including pain rating scale, medication(s)/side effects and non-pharmacologic comfort measures Outcome: Progressing   Problem: Health Behavior/Discharge Planning: Goal: Ability to manage health-related needs will improve Outcome: Progressing   Problem: Clinical Measurements: Goal: Ability to maintain clinical measurements within normal limits will improve Outcome: Progressing Goal: Will remain free from infection Outcome: Progressing Goal: Diagnostic test results will improve Outcome: Progressing Goal: Respiratory complications will improve Outcome: Progressing Goal: Cardiovascular complication will be avoided Outcome: Progressing   Problem: Activity: Goal: Risk for activity intolerance will decrease Outcome: Progressing   Problem: Nutrition: Goal: Adequate nutrition will be maintained Outcome: Progressing   Problem: Coping: Goal: Level of anxiety will decrease Outcome: Progressing   Problem: Elimination: Goal: Will not experience complications related to bowel motility Outcome: Progressing Goal: Will not experience complications related to urinary retention Outcome: Progressing   Problem: Pain Managment: Goal: General experience of comfort will improve and/or be controlled Outcome: Progressing; pt given IV morphine multiple times during this RN's shift due to insufficient amount of oxy loaded in the pyxis   Problem: Safety: Goal: Ability to remain free from injury will improve Outcome: Progressing   Problem: Skin Integrity: Goal: Risk for impaired skin integrity will decrease Outcome: Progressing

## 2023-05-27 NOTE — Progress Notes (Signed)
PHARMACY - ANTICOAGULATION CONSULT NOTE  Pharmacy Consult for hx of PE and A fib on lovenox, s/p feeding tube placement procedure  Indication: atrial fibrillation  Allergies  Allergen Reactions   Sulfa Antibiotics Other (See Comments)    Mouth sore and raw    Patient Measurements: Height: 5\' 1"  (154.9 cm) Weight: 55.8 kg (123 lb 0.3 oz) IBW/kg (Calculated) : 47.8 Heparin Dosing Weight: 50.1 kg  Vital Signs: Temp: 98 F (36.7 C) (01/25 0034) Temp Source: Oral (01/25 0034) BP: 101/68 (01/25 0034) Pulse Rate: 103 (01/25 0034)  Labs: Recent Labs    05/24/23 1903 05/25/23 0645 05/26/23 0533 05/26/23 1619 05/26/23 2316  HGB 11.1* 9.5* 9.3* 9.6*  --   HCT 35.0* 30.2* 29.8* 30.3*  --   PLT 248 172 162 199  --   APTT 32  --   --   --   --   LABPROT 14.5  --   --   --   --   INR 1.1  --   --   --   --   HEPARINUNFRC  --  0.61  --  0.21* 0.37  CREATININE 0.69 0.63 0.72  --   --     Estimated Creatinine Clearance: 50.1 mL/min (by C-G formula based on SCr of 0.72 mg/dL).   Medical History: Past Medical History:  Diagnosis Date   Adenomatous colon polyp    Adrenal carcinoma, right (HCC) 12/28/2016   a.) high grade carcinoma with extensive necrosis --> s/p adrenalectomy   Anxiety    Aortic atherosclerosis (HCC)    Arthritis of right knee    Atrial fibrillation and flutter (HCC)    a.) CHA2DS2-VASc = 3 (age, sex, vascular disease history) as of 02/13/2023; b.) cardiac rate/rhythm maintained intrinsically without pharmacological intervention; no chronic OAC (does take low dose ASA)   B12 deficiency    Breast cancer (HCC) 1997   a.) stage IB (cT1a, cN0, cM0, G3, ER+, PR-, HER2-); s/p lumpectomy + XRT   COPD (chronic obstructive pulmonary disease) (HCC)    Coronary artery disease    a.) cCTA 12/09/2021: Ca2+ = 135 (80th %'ile for age/sex/race match contol) --> 25-49% pLAD and RCA distributions   Depression    Diastolic dysfunction    a.) TTE 12/11/2020, no RWMAs, norm  RVSF, G1DD; b.) TTE 12/14/2021: EF 55-60, no RWMAs, norm RVSF, G1DD; c.) TTE 01/27/2023: EF 50-55%, no RWMAs, G1DD, mild RAE, mild MR, mod TR   Dry cough 03/02/2017   Dyspnea    Full dentures    GERD (gastroesophageal reflux disease)    History of hiatal hernia    HLD (hyperlipidemia)    Invasive carcinoma of breast (HCC) 11/15/2017   a.) path (+) for G3, ER -, PR -, HER2/neu - ; s/p lumpectomy 01/17/2018 + 4 cycles TC systemic chemotherapy + XRT   Long term current use of aspirin    Personal history of chemotherapy    Personal history of radiation therapy    Pneumothorax of left lung after biopsy 01/25/2023   a.) hypoxic in PACU requiring re-intubation --> transferred to ICU --> chest tube placed by PCCM   RLS (restless legs syndrome)    a.) on ropinirole   Skin cancer    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   a.) Stage IB (cT1a, cN0, cM0, G3, ER+, PR-, HER2-)    Medications:  Scheduled:   benzonatate  200 mg Oral TID   Chlorhexidine Gluconate Cloth  6 each Topical Q0600  feeding supplement (OSMOLITE 1.5 CAL)  237 mL Per Tube 5 X Daily   fentaNYL  1 patch Transdermal Q72H   free water  60 mL Per Tube 5 X Daily   multivitamin with minerals  1 tablet Per Tube Daily   omeprazole  40 mg Oral Daily   rOPINIRole  0.25 mg Oral QHS   rosuvastatin  40 mg Oral Daily   sodium chloride flush  10-40 mL Intracatheter Q12H   thiamine  100 mg Per Tube Daily    Assessment: 68 year old female with past medical history significant for COPD, CAD, anxiety and depression, atrial fibrillation/flutter, HDS, RLS, dysphagia,  breast cancer sp surgery and radiation, carcinoma of the vocal cord sp surgery and radiation (subsequent dysphagia), adrenal carcinoma of unknown primary, now with non-small cell lung cancer now on chemotherapy and radiation therapy.   Patient taking chemo weekly for 6 cycles. On lovenox in setting of PE w/ last dose 01/22 at approximately 1100 per patient.    1/24  1619  HL 0.21  subtherapeutic 1/24 2316 HL 0.37 therapeutic x 1  Goal of Therapy:  Heparin level 0.3-0.7 units/ml Monitor platelets by anticoagulation protocol: Yes   Plan:  ---Continue heparin infusion at 650 units/hr  ---Recheck heparin level w/ AM labs to confirm ---Continue to monitor H&H and platelets -surgery note recommends restart PTA lovenox on 1/25- f/u  Otelia Sergeant, PharmD, MBA 05/27/2023 1:18 AM

## 2023-05-27 NOTE — Progress Notes (Signed)
Daughter and granddaughter added to patient contacts with permission from patient. Requesting discharge teaching regarding tube feeds and using PEG. Would like update with discharge date so they can plan to come for teaching and assist patient at home. Notified case Production designer, theatre/television/film.

## 2023-05-27 NOTE — Progress Notes (Signed)
PHARMACY - ANTICOAGULATION CONSULT NOTE  Pharmacy Consult for hx of PE and A fib on lovenox, s/p feeding tube placement procedure  Indication: atrial fibrillation  Allergies  Allergen Reactions   Sulfa Antibiotics Other (See Comments)    Mouth sore and raw    Patient Measurements: Height: 5\' 1"  (154.9 cm) Weight: 56.2 kg (123 lb 14.4 oz) IBW/kg (Calculated) : 47.8 Heparin Dosing Weight: 50.1 kg  Vital Signs: Temp: 98.4 F (36.9 C) (01/25 0440) Temp Source: Oral (01/25 0034) BP: 119/66 (01/25 0440) Pulse Rate: 103 (01/25 0034)  Labs: Recent Labs    05/24/23 1903 05/24/23 1903 05/25/23 0645 05/26/23 0533 05/26/23 1619 05/26/23 2316 05/27/23 0525  HGB 11.1*  --  9.5* 9.3* 9.6*  --  10.1*  HCT 35.0*  --  30.2* 29.8* 30.3*  --  31.7*  PLT 248  --  172 162 199  --  202  APTT 32  --   --   --   --   --   --   LABPROT 14.5  --   --   --   --   --   --   INR 1.1  --   --   --   --   --   --   HEPARINUNFRC  --    < > 0.61  --  0.21* 0.37 0.36  CREATININE 0.69  --  0.63 0.72  --   --   --    < > = values in this interval not displayed.    Estimated Creatinine Clearance: 50.1 mL/min (by C-G formula based on SCr of 0.72 mg/dL).   Medical History: Past Medical History:  Diagnosis Date   Adenomatous colon polyp    Adrenal carcinoma, right (HCC) 12/28/2016   a.) high grade carcinoma with extensive necrosis --> s/p adrenalectomy   Anxiety    Aortic atherosclerosis (HCC)    Arthritis of right knee    Atrial fibrillation and flutter (HCC)    a.) CHA2DS2-VASc = 3 (age, sex, vascular disease history) as of 02/13/2023; b.) cardiac rate/rhythm maintained intrinsically without pharmacological intervention; no chronic OAC (does take low dose ASA)   B12 deficiency    Breast cancer (HCC) 1997   a.) stage IB (cT1a, cN0, cM0, G3, ER+, PR-, HER2-); s/p lumpectomy + XRT   COPD (chronic obstructive pulmonary disease) (HCC)    Coronary artery disease    a.) cCTA 12/09/2021: Ca2+ = 135  (80th %'ile for age/sex/race match contol) --> 25-49% pLAD and RCA distributions   Depression    Diastolic dysfunction    a.) TTE 12/11/2020, no RWMAs, norm RVSF, G1DD; b.) TTE 12/14/2021: EF 55-60, no RWMAs, norm RVSF, G1DD; c.) TTE 01/27/2023: EF 50-55%, no RWMAs, G1DD, mild RAE, mild MR, mod TR   Dry cough 03/02/2017   Dyspnea    Full dentures    GERD (gastroesophageal reflux disease)    History of hiatal hernia    HLD (hyperlipidemia)    Invasive carcinoma of breast (HCC) 11/15/2017   a.) path (+) for G3, ER -, PR -, HER2/neu - ; s/p lumpectomy 01/17/2018 + 4 cycles TC systemic chemotherapy + XRT   Long term current use of aspirin    Personal history of chemotherapy    Personal history of radiation therapy    Pneumothorax of left lung after biopsy 01/25/2023   a.) hypoxic in PACU requiring re-intubation --> transferred to ICU --> chest tube placed by PCCM   RLS (restless legs syndrome)  a.) on ropinirole   Skin cancer    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   a.) Stage IB (cT1a, cN0, cM0, G3, ER+, PR-, HER2-)    Medications:  Scheduled:   benzonatate  200 mg Oral TID   Chlorhexidine Gluconate Cloth  6 each Topical Q0600   feeding supplement (OSMOLITE 1.5 CAL)  237 mL Per Tube 5 X Daily   fentaNYL  1 patch Transdermal Q72H   free water  60 mL Per Tube 5 X Daily   multivitamin with minerals  1 tablet Per Tube Daily   omeprazole  40 mg Oral Daily   rOPINIRole  0.25 mg Oral QHS   rosuvastatin  40 mg Oral Daily   sodium chloride flush  10-40 mL Intracatheter Q12H   thiamine  100 mg Per Tube Daily    Assessment: 68 year old female with past medical history significant for COPD, CAD, anxiety and depression, atrial fibrillation/flutter, HDS, RLS, dysphagia,  breast cancer sp surgery and radiation, carcinoma of the vocal cord sp surgery and radiation (subsequent dysphagia), adrenal carcinoma of unknown primary, now with non-small cell lung cancer now on chemotherapy  and radiation therapy.   Patient taking chemo weekly for 6 cycles. On lovenox in setting of PE w/ last dose 01/22 at approximately 1100 per patient.    1/24 1619  HL 0.21  subtherapeutic 1/24 2316 HL 0.37 therapeutic x 1 1/25 0525 HL 0.36, therapeutic x 2  Goal of Therapy:  Heparin level 0.3-0.7 units/ml Monitor platelets by anticoagulation protocol: Yes   Plan:  ---Continue heparin infusion at 650 units/hr  ---Recheck heparin level daily w/ AM labs while therapeutic ---Continue to monitor H&H and platelets -surgery note recommends restart PTA lovenox on 1/25- f/u  Otelia Sergeant, PharmD, [facility] [place] 05/27/2023 5:59 AM

## 2023-05-27 NOTE — Progress Notes (Signed)
Opened PEG tube to drain via gravity, per MD Maurine Minister order. Assisted by charge RN. Scant drainage noted at this time.

## 2023-05-27 NOTE — Progress Notes (Signed)
MEWS Progress Note  Patient Details Name: Denise Watts MRN: [id number] DOB: 1956/02/05 Today's Date: 05/27/2023   MEWS Flowsheet Documentation:  Assess: MEWS Score Temp: 97.9 F (36.6 C) BP: 109/66 MAP (mmHg): 79 Pulse Rate: (!) 106 ECG Heart Rate: 92 Resp: 15 Level of Consciousness: Responds to Voice SpO2: 93 % O2 Device: Room Air Patient Activity (if Appropriate): In bed O2 Flow Rate (L/min): 2 L/min Assess: MEWS Score MEWS Temp: 0 MEWS Systolic: 0 MEWS Pulse: 1 MEWS RR: 0 MEWS LOC: 1 MEWS Score: 2 MEWS Score Color: Yellow Assess: SIRS CRITERIA SIRS Temperature : 0 SIRS Respirations : 0 SIRS Pulse: 1 SIRS WBC: 0 SIRS Score Sum : 1 Assess: if the MEWS score is Yellow or Red Were vital signs accurate and taken at a resting state?: Yes Does the patient meet 2 or more of the SIRS criteria?: No MEWS guidelines implemented : Yes, yellow Treat MEWS Interventions: Considered administering scheduled or prn medications/treatments as ordered Take Vital Signs Increase Vital Sign Frequency : Yellow: Q2hr x1, continue Q4hrs until patient remains green for 12hrs Escalate MEWS: Escalate: Yellow: Discuss with charge nurse and consider notifying provider and/or RRT        Gicela Schwarting 05/27/2023, 10:35 AM

## 2023-05-27 NOTE — Progress Notes (Signed)
PHARMACY - ANTICOAGULATION CONSULT NOTE  Pharmacy Consult for Heparin Infusion Indication: atrial fibrillation, VTE treatment  Allergies  Allergen Reactions   Sulfa Antibiotics Other (See Comments)    Mouth sore and raw    Patient Measurements: Height: 5\' 1"  (154.9 cm) Weight: 56.2 kg (123 lb 14.4 oz) IBW/kg (Calculated) : 47.8 Heparin Dosing Weight: 56.2 kg  Vital Signs: Temp: 97.5 F (36.4 C) (01/25 1121) BP: 98/60 (01/25 1121) Pulse Rate: 112 (01/25 1121)  Labs: Recent Labs    05/24/23 1903 05/24/23 1903 05/25/23 0645 05/26/23 0533 05/26/23 1619 05/26/23 2316 05/27/23 0525  HGB 11.1*  --  9.5* 9.3* 9.6*  --  10.1*  HCT 35.0*  --  30.2* 29.8* 30.3*  --  31.7*  PLT 248  --  172 162 199  --  202  APTT 32  --   --   --   --   --   --   LABPROT 14.5  --   --   --   --   --   --   INR 1.1  --   --   --   --   --   --   HEPARINUNFRC  --    < > 0.61  --  0.21* 0.37 0.36  CREATININE 0.69  --  0.63 0.72  --   --   --    < > = values in this interval not displayed.    Estimated Creatinine Clearance: 50.1 mL/min (by C-G formula based on SCr of 0.72 mg/dL).   Medical History: Past Medical History:  Diagnosis Date   Adenomatous colon polyp    Adrenal carcinoma, right (HCC) 12/28/2016   a.) high grade carcinoma with extensive necrosis --> s/p adrenalectomy   Anxiety    Aortic atherosclerosis (HCC)    Arthritis of right knee    Atrial fibrillation and flutter (HCC)    a.) CHA2DS2-VASc = 3 (age, sex, vascular disease history) as of 02/13/2023; b.) cardiac rate/rhythm maintained intrinsically without pharmacological intervention; no chronic OAC (does take low dose ASA)   B12 deficiency    Breast cancer (HCC) 1997   a.) stage IB (cT1a, cN0, cM0, G3, ER+, PR-, HER2-); s/p lumpectomy + XRT   COPD (chronic obstructive pulmonary disease) (HCC)    Coronary artery disease    a.) cCTA 12/09/2021: Ca2+ = 135 (80th %'ile for age/sex/race match contol) --> 25-49% pLAD and RCA  distributions   Depression    Diastolic dysfunction    a.) TTE 12/11/2020, no RWMAs, norm RVSF, G1DD; b.) TTE 12/14/2021: EF 55-60, no RWMAs, norm RVSF, G1DD; c.) TTE 01/27/2023: EF 50-55%, no RWMAs, G1DD, mild RAE, mild MR, mod TR   Dry cough 03/02/2017   Dyspnea    Full dentures    GERD (gastroesophageal reflux disease)    History of hiatal hernia    HLD (hyperlipidemia)    Invasive carcinoma of breast (HCC) 11/15/2017   a.) path (+) for G3, ER -, PR -, HER2/neu - ; s/p lumpectomy 01/17/2018 + 4 cycles TC systemic chemotherapy + XRT   Long term current use of aspirin    Personal history of chemotherapy    Personal history of radiation therapy    Pneumothorax of left lung after biopsy 01/25/2023   a.) hypoxic in PACU requiring re-intubation --> transferred to ICU --> chest tube placed by PCCM   RLS (restless legs syndrome)    a.) on ropinirole   Skin cancer    Squamous cell carcinoma of  vocal cord (HCC) 2015   a.) Stage IB (cT1a, cN0, cM0, G3, ER+, PR-, HER2-)    Medications:  Scheduled:   benzonatate  200 mg Oral TID   Chlorhexidine Gluconate Cloth  6 each Topical Q0600   feeding supplement (OSMOLITE 1.5 CAL)  237 mL Per Tube 5 X Daily   fentaNYL  1 patch Transdermal Q72H   free water  60 mL Per Tube 5 X Daily   multivitamin with minerals  1 tablet Per Tube Daily   omeprazole  40 mg Oral Daily   rOPINIRole  0.25 mg Oral QHS   rosuvastatin  40 mg Oral Daily   sodium chloride flush  10-40 mL Intracatheter Q12H   thiamine  100 mg Per Tube Daily    Assessment:  Denise Watts is a 68 y.o. female presenting with dysphagia. PMH significant for COPD, CAD, anxiety and depression, atrial fibrillation/flutter, HDS, RLS, dysphagia, breast cancer s/p surgery and radiation, carcinoma of the vocal cord sp surgery and radiation (subsequent dysphagia), adrenal carcinoma of unknown primary, now with non-small cell lung cancer now on chemotherapy and radiation therapy. Patient was on enoxaparin  PTA per chart review. Patient was previously on heparin infusion which was switched to enoxaparin. Last dose of enoxaparin was 1/25 0858. Pharmacy has been consulted to reinitiate and manage heparin infusion.  Baseline Labs: Hgb 10.1, Hct 31.7, Plt 202   Goal of Therapy:  Heparin level 0.3-0.7 units/ml Monitor platelets by anticoagulation protocol: Yes   Plan:  Resume heparin infusion at 650 units/hr Check HL in 6 hours  Continue to monitor H&H and platelets daily while on heparin infusion   Celene Squibb, PharmD Clinical Pharmacist 05/27/2023 4:25 PM

## 2023-05-27 NOTE — TOC Progression Note (Signed)
Transition of Care [facility]) - Progression Note    Patient Details  Name: Denise Watts MRN: [id number] Date of Birth: October 02, 1955  Transition of Care [facility]) CM/SW Contact  Bing Quarry, RN Phone Number: 05/27/2023, 12:10 PM  Clinical Narrative: 1/25: Patient condition has changed and will not be discharging today. Notified Pam with [employer] (providing tube feedings and education) and she indicated when ready spouse will need Unit RN beside teaching on tube feedings as he was not present at patient teaching session yesterday per reporting.  TOC to continue to follow.   Gabriel Cirri MSN RN CM  RN Case Manager [place]  Transitions of Care Direct Dial: [phone] (Weekends Only) [facility] Main Office Phone: [phone] [facility] Fax: [phone] [email or web address]          Expected Discharge Plan and Services                                               Social Determinants of Health (SDOH) Interventions SDOH Screenings   Food Insecurity: No Food Insecurity (05/24/2023)  Housing: Low Risk  (05/24/2023)  Transportation Needs: No Transportation Needs (05/24/2023)  Utilities: Not At Risk (05/24/2023)  Alcohol Screen: Low Risk  (05/19/2023)  Depression (PHQ2-9): Low Risk  (05/19/2023)  Recent Concern: Depression (PHQ2-9) - High Risk (03/10/2023)  Financial Resource Strain: Low Risk  (05/19/2023)  Physical Activity: Inactive (05/19/2023)  Social Connections: Moderately Isolated (05/24/2023)  Stress: Stress Concern Present (05/19/2023)  Tobacco Use: Medium Risk (05/25/2023)  Health Literacy: Adequate Health Literacy (05/19/2023)    Readmission Risk Interventions     No data to display

## 2023-05-28 ENCOUNTER — Inpatient Hospital Stay: Payer: Medicare HMO

## 2023-05-28 DIAGNOSIS — R1310 Dysphagia, unspecified: Secondary | ICD-10-CM | POA: Diagnosis not present

## 2023-05-28 LAB — CBC
HCT: 31.8 % — ABNORMAL LOW (ref 36.0–46.0)
Hemoglobin: 9.9 g/dL — ABNORMAL LOW (ref 12.0–15.0)
MCH: 32.6 pg (ref 26.0–34.0)
MCHC: 31.1 g/dL (ref 30.0–36.0)
MCV: 104.6 fL — ABNORMAL HIGH (ref 80.0–100.0)
Platelets: 189 10*3/uL (ref 150–400)
RBC: 3.04 MIL/uL — ABNORMAL LOW (ref 3.87–5.11)
RDW: 15.7 % — ABNORMAL HIGH (ref 11.5–15.5)
WBC: 5.3 10*3/uL (ref 4.0–10.5)
nRBC: 0 % (ref 0.0–0.2)

## 2023-05-28 LAB — BASIC METABOLIC PANEL
Anion gap: 5 (ref 5–15)
BUN: 6 mg/dL — ABNORMAL LOW (ref 8–23)
CO2: 29 mmol/L (ref 22–32)
Calcium: 8.2 mg/dL — ABNORMAL LOW (ref 8.9–10.3)
Chloride: 105 mmol/L (ref 98–111)
Creatinine, Ser: 0.45 mg/dL (ref 0.44–1.00)
GFR, Estimated: >60 mL/min (ref >60)
Glucose, Bld: 85 mg/dL (ref 70–99)
Potassium: 3.9 mmol/L (ref 3.5–5.1)
Sodium: 139 mmol/L (ref 135–145)

## 2023-05-28 LAB — GLUCOSE, CAPILLARY
Glucose-Capillary: 101 mg/dL — ABNORMAL HIGH (ref 70–99)
Glucose-Capillary: 116 mg/dL — ABNORMAL HIGH (ref 70–99)
Glucose-Capillary: 138 mg/dL — ABNORMAL HIGH (ref 70–99)
Glucose-Capillary: 78 mg/dL (ref 70–99)
Glucose-Capillary: 95 mg/dL (ref 70–99)

## 2023-05-28 LAB — HEPARIN LEVEL (UNFRACTIONATED)
Heparin Unfractionated: 0.47 [IU]/mL (ref 0.30–0.70)
Heparin Unfractionated: 0.37 [IU]/mL (ref 0.30–0.70)

## 2023-05-28 MED ORDER — OSMOLITE 1.5 CAL PO LIQD
1000.0000 mL | ORAL | Status: AC
  Administered 2023-05-28 – 2023-05-29 (×2): 1000 mL

## 2023-05-28 NOTE — Progress Notes (Signed)
PHARMACY - ANTICOAGULATION CONSULT NOTE  Pharmacy Consult for Heparin Infusion Indication: atrial fibrillation, VTE treatment  Allergies  Allergen Reactions   Sulfa Antibiotics Other (See Comments)    Mouth sore and raw    Patient Measurements: Height: 5\' 1"  (154.9 cm) Weight: 54.9 kg (121 lb 0.5 oz) IBW/kg (Calculated) : 47.8 Heparin Dosing Weight: 56.2 kg  Vital Signs: Temp: 97.8 F (36.6 C) (01/26 0414) Temp Source: Oral (01/26 0414) BP: 111/66 (01/26 0414) Pulse Rate: 99 (01/26 0414)  Labs: Recent Labs    05/26/23 0533 05/26/23 1619 05/26/23 2316 05/27/23 0525 05/28/23 0106 05/28/23 0804  HGB 9.3* 9.6*  --  10.1*  --  9.9*  HCT 29.8* 30.3*  --  31.7*  --  31.8*  PLT 162 199  --  202  --  189  HEPARINUNFRC  --  0.21*   < > 0.36 0.47 0.37  CREATININE 0.72  --   --   --   --  0.45   < > = values in this interval not displayed.    Estimated Creatinine Clearance: 50.1 mL/min (by C-G formula based on SCr of 0.45 mg/dL).   Medical History: Past Medical History:  Diagnosis Date   Adenomatous colon polyp    Adrenal carcinoma, right (HCC) 12/28/2016   a.) high grade carcinoma with extensive necrosis --> s/p adrenalectomy   Anxiety    Aortic atherosclerosis (HCC)    Arthritis of right knee    Atrial fibrillation and flutter (HCC)    a.) CHA2DS2-VASc = 3 (age, sex, vascular disease history) as of 02/13/2023; b.) cardiac rate/rhythm maintained intrinsically without pharmacological intervention; no chronic OAC (does take low dose ASA)   B12 deficiency    Breast cancer (HCC) 1997   a.) stage IB (cT1a, cN0, cM0, G3, ER+, PR-, HER2-); s/p lumpectomy + XRT   COPD (chronic obstructive pulmonary disease) (HCC)    Coronary artery disease    a.) cCTA 12/09/2021: Ca2+ = 135 (80th %'ile for age/sex/race match contol) --> 25-49% pLAD and RCA distributions   Depression    Diastolic dysfunction    a.) TTE 12/11/2020, no RWMAs, norm RVSF, G1DD; b.) TTE 12/14/2021: EF 55-60, no  RWMAs, norm RVSF, G1DD; c.) TTE 01/27/2023: EF 50-55%, no RWMAs, G1DD, mild RAE, mild MR, mod TR   Dry cough 03/02/2017   Dyspnea    Full dentures    GERD (gastroesophageal reflux disease)    History of hiatal hernia    HLD (hyperlipidemia)    Invasive carcinoma of breast (HCC) 11/15/2017   a.) path (+) for G3, ER -, PR -, HER2/neu - ; s/p lumpectomy 01/17/2018 + 4 cycles TC systemic chemotherapy + XRT   Long term current use of aspirin    Personal history of chemotherapy    Personal history of radiation therapy    Pneumothorax of left lung after biopsy 01/25/2023   a.) hypoxic in PACU requiring re-intubation --> transferred to ICU --> chest tube placed by PCCM   RLS (restless legs syndrome)    a.) on ropinirole   Skin cancer    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 female presenting with dysphagia. PMH significant for COPD, CAD, anxiety and depression, atrial fibrillation/flutter, HDS, RLS, dysphagia, breast cancer s/p surgery and radiation, carcinoma of the vocal cord sp surgery and radiation (subsequent dysphagia), adrenal carcinoma of unknown primary, now with non-small cell lung cancer now on chemotherapy and radiation therapy. Patient was on enoxaparin PTA per chart review. Patient was previously on heparin infusion which was switched to enoxaparin. Last dose of enoxaparin was 1/25 0858.  Pharmacy has been consulted to reinitiate and manage heparin infusion.  Baseline Labs: Hgb 10.1, Hct 31.7, Plt 202   Goal of Therapy:  Heparin level 0.3-0.7 units/ml Monitor platelets by anticoagulation protocol: Yes  1/26 0106 HL 0.47, therapeutic x 1 1/26 0804 HL 0.37, therapeutic x 2   Plan:  Continue heparin infusion at 650 units/hr Recheck HL with morning labs Continue to monitor H&H and platelets daily while on heparin infusion(remain stable currently)  Bettey Costa, PharmD Clinical Pharmacist 05/28/2023 9:17 AM

## 2023-05-28 NOTE — Progress Notes (Signed)
PHARMACY - ANTICOAGULATION CONSULT NOTE  Pharmacy Consult for Heparin Infusion Indication: atrial fibrillation, VTE treatment  Allergies  Allergen Reactions   Sulfa Antibiotics Other (See Comments)    Mouth sore and raw    Patient Measurements: Height: 5\' 1"  (154.9 cm) Weight: 56.2 kg (123 lb 14.4 oz) IBW/kg (Calculated) : 47.8 Heparin Dosing Weight: 56.2 kg  Vital Signs: Temp: 98 F (36.7 C) (01/26 0057) BP: 108/65 (01/26 0057) Pulse Rate: 93 (01/26 0057)  Labs: Recent Labs    05/25/23 0645 05/26/23 0533 05/26/23 1619 05/26/23 2316 05/27/23 0525 05/28/23 0106  HGB 9.5* 9.3* 9.6*  --  10.1*  --   HCT 30.2* 29.8* 30.3*  --  31.7*  --   PLT 172 162 199  --  202  --   HEPARINUNFRC 0.61  --  0.21* 0.37 0.36 0.47  CREATININE 0.63 0.72  --   --   --   --     Estimated Creatinine Clearance: 50.1 mL/min (by C-G formula based on SCr of 0.72 mg/dL).   Medical History: Past Medical History:  Diagnosis Date   Adenomatous colon polyp    Adrenal carcinoma, right (HCC) 12/28/2016   a.) high grade carcinoma with extensive necrosis --> s/p adrenalectomy   Anxiety    Aortic atherosclerosis (HCC)    Arthritis of right knee    Atrial fibrillation and flutter (HCC)    a.) CHA2DS2-VASc = 3 (age, sex, vascular disease history) as of 02/13/2023; b.) cardiac rate/rhythm maintained intrinsically without pharmacological intervention; no chronic OAC (does take low dose ASA)   B12 deficiency    Breast cancer (HCC) 1997   a.) stage IB (cT1a, cN0, cM0, G3, ER+, PR-, HER2-); s/p lumpectomy + XRT   COPD (chronic obstructive pulmonary disease) (HCC)    Coronary artery disease    a.) cCTA 12/09/2021: Ca2+ = 135 (80th %'ile for age/sex/race match contol) --> 25-49% pLAD and RCA distributions   Depression    Diastolic dysfunction    a.) TTE 12/11/2020, no RWMAs, norm RVSF, G1DD; b.) TTE 12/14/2021: EF 55-60, no RWMAs, norm RVSF, G1DD; c.) TTE 01/27/2023: EF 50-55%, no RWMAs, G1DD, mild RAE,  mild MR, mod TR   Dry cough 03/02/2017   Dyspnea    Full dentures    GERD (gastroesophageal reflux disease)    History of hiatal hernia    HLD (hyperlipidemia)    Invasive carcinoma of breast (HCC) 11/15/2017   a.) path (+) for G3, ER -, PR -, HER2/neu - ; s/p lumpectomy 01/17/2018 + 4 cycles TC systemic chemotherapy + XRT   Long term current use of aspirin    Personal history of chemotherapy    Personal history of radiation therapy    Pneumothorax of left lung after biopsy 01/25/2023   a.) hypoxic in PACU requiring re-intubation --> transferred to ICU --> chest tube placed by PCCM   RLS (restless legs syndrome)    a.) on ropinirole   Skin cancer    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 female presenting with dysphagia. PMH significant for COPD, CAD, anxiety and depression, atrial fibrillation/flutter, HDS, RLS, dysphagia, breast cancer s/p surgery and radiation, carcinoma of the vocal cord sp surgery and radiation (subsequent dysphagia), adrenal carcinoma of unknown primary, now with non-small cell lung cancer now on chemotherapy and radiation therapy. Patient was on enoxaparin PTA per chart review. Patient was previously on heparin infusion which was switched to enoxaparin. Last dose of enoxaparin was 1/25 0858. Pharmacy has been consulted to reinitiate and manage heparin  infusion.  Baseline Labs: Hgb 10.1, Hct 31.7, Plt 202   Goal of Therapy:  Heparin level 0.3-0.7 units/ml Monitor platelets by anticoagulation protocol: Yes  1/26 0106 HL 0.47, therapeutic x 1   Plan:  Continue heparin infusion at 650 units/hr Recheck HL at 0800 to confirm, then daily  Continue to monitor H&H and platelets daily while on heparin infusion   Otelia Sergeant, PharmD, [facility] 05/28/2023 1:39 AM

## 2023-05-28 NOTE — Progress Notes (Signed)
CC: G tube placement Subjective: Patient looks marginally better this morning.  She reports that she continues to have abdominal pain without nausea or vomiting.  She has not had any flatus or bowel movements.  G-tube has been to gravity with scant output.  Not requiring any supplemental oxygen  Objective: Vital signs in last 24 hours: Temp:  [97.5 F (36.4 C)-98.2 F (36.8 C)] 97.8 F (36.6 C) (01/26 0414) Pulse Rate:  [93-112] 101 (01/26 0924) Resp:  [15-16] 16 (01/26 0414) BP: (98-115)/(49-67) 115/67 (01/26 0924) SpO2:  [90 %-95 %] 91 % (01/26 0924) Weight:  [54.9 kg] 54.9 kg (01/26 0500) Last BM Date : 05/17/23 (PTA)  Intake/Output from previous day: 01/25 0701 - 01/26 0700 In: 3.9 [I.V.:3.9] Out: 2000 [Urine:2000] Intake/Output this shift: Total I/O In: 240 [P.O.:240] Out: -   Physical exam:  Rhonchorous breath sounds bilaterally, abdomen is soft, tender to palpation throughout, G-tube in place with scant gastric output in the Foley bag that this to gravity, midline incision with glue on it without any erythema  Lab Results: CBC  Recent Labs    05/27/23 0525 05/28/23 0804  WBC 7.7 5.3  HGB 10.1* 9.9*  HCT 31.7* 31.8*  PLT 202 189   BMET Recent Labs    05/26/23 0533 05/28/23 0804  NA 139 139  K 4.0 3.9  CL 102 105  CO2 25 29  GLUCOSE 141* 85  BUN <5* 6*  CREATININE 0.72 0.45  CALCIUM 8.6* 8.2*   PT/INR No results for input(s): "LABPROT", "INR" in the last 72 hours. ABG No results for input(s): "PHART", "HCO3" in the last 72 hours.  Invalid input(s): "PCO2", "PO2"  Studies/Results: CT ABDOMEN PELVIS WO CONTRAST Result Date: 05/27/2023 CLINICAL DATA:  Abdominal pain. Recent gastrostomy tube placement. Lung carcinoma. * Tracking Code: BO * EXAM: CT ABDOMEN AND PELVIS WITHOUT CONTRAST TECHNIQUE: Multidetector CT imaging of the abdomen and pelvis was performed following the standard protocol without IV contrast. RADIATION DOSE REDUCTION: This exam was  performed according to the departmental dose-optimization program which includes automated exposure control, adjustment of the mA and/or kV according to patient size and/or use of iterative reconstruction technique. COMPARISON:  12/09/2021 FINDINGS: Lower chest: Tiny bilateral pleural effusions. Hepatobiliary: No mass visualized on this unenhanced exam. Gallbladder is unremarkable. No evidence of biliary ductal dilatation. Pancreas: No mass or inflammatory process visualized on this unenhanced exam. Spleen:  Within normal limits in size. Adrenals/Urinary tract: No evidence of urolithiasis or hydronephrosis. Unremarkable unopacified urinary bladder. Stomach/Bowel: Gastrostomy tube is seen appropriate position in the gastric body. Tiny amount of free intraperitoneal air as well as gas within the anterior abdominal wall adjacent to the gastrostomy is attributable to recent gastrostomy tube placement. No evidence of bowel obstruction, inflammatory process, or abnormal fluid collections. Diverticulosis is seen mainly involving the sigmoid colon, however there is no evidence of diverticulitis. Vascular/Lymphatic: No pathologically enlarged lymph nodes identified. No evidence of abdominal aortic aneurysm. Reproductive:  No mass or other significant abnormality. Other:  None. Musculoskeletal:  No suspicious bone lesions identified. IMPRESSION: Gastrostomy tube in appropriate position. Tiny amount of free intraperitoneal air and gas within the anterior abdominal wall, attributable to recent gastrostomy tube placement. No other acute findings. Colonic diverticulosis, without radiographic evidence of diverticulitis. Electronically Signed   By: Danae Orleans M.D.   On: 05/27/2023 13:19   DG Abd 1 View Result Date: 05/27/2023 CLINICAL DATA:  Abdominal pain and distension. EXAM: ABDOMEN - 1 VIEW COMPARISON:  01/25/2023 FINDINGS: Prominent gaseous distention  of the stomach. Catheter overlies the left abdomen, potentially a  gastrostomy tube. Gas dilated bowel loop overlying the right upper abdomen may reflect distended transverse colon. Diffuse gaseous small bowel distension noted. Gas and stool noted in the sigmoid colon with prominent gas in stool visible in the rectum. IMPRESSION: Prominent gaseous distention of the stomach with diffuse gaseous small bowel distension. Gas dilated bowel loop overlying the right upper abdomen may reflect distended transverse colon. Findings likely reflect ileus. CT imaging could be used to more definitively evaluate. Electronically Signed   By: Kennith Center M.D.   On: 05/27/2023 11:10   DG Chest Port 1 View Result Date: 05/27/2023 CLINICAL DATA:  Shortness of breath.  Abnormal lung sounds. EXAM: PORTABLE CHEST 1 VIEW COMPARISON:  05/10/2023 FINDINGS: Scattered bilateral pulmonary nodules again noted. No focal consolidation or pulmonary edema. No substantial pleural effusion. The cardiopericardial silhouette is within normal limits for size. Right Port-A-Cath remains in place. No acute bony abnormality. IMPRESSION: Scattered bilateral pulmonary nodules again noted. No acute cardiopulmonary findings. Electronically Signed   By: Kennith Center M.D.   On: 05/27/2023 11:06    Anti-infectives: Anti-infectives (From admission, onward)    Start     Dose/Rate Route Frequency Ordered Stop   05/25/23 2200  cefoTEtan (CEFOTAN) 2 g in sodium chloride 0.9 % 100 mL IVPB        2 g 200 mL/hr over 30 Minutes Intravenous Every 12 hours 05/25/23 1923 05/26/23 1910   05/25/23 1200  cefoTEtan (CEFOTAN) 2 g in sodium chloride 0.9 % 100 mL IVPB        2 g 200 mL/hr over 30 Minutes Intravenous  Once 05/24/23 1850 05/25/23 1905       Assessment/Plan:  Patient status post gastrostomy tube placement.  We were called back by primary team due to declining clinical status.  She appears slightly improved today.  CT scan performed yesterday without any acute intra-abdominal pathology and G-tube looks to be in  the correct position.  At this time okay to restart tube feeds.  I would start at trickle tube feeds and advance slowly over 24-hour.  Baker Pierini, M.D. [facility]

## 2023-05-28 NOTE — Plan of Care (Signed)
  Problem: Education: Goal: Knowledge of General Education information will improve Description: Including pain rating scale, medication(s)/side effects and non-pharmacologic comfort measures Outcome: Progressing   Problem: Clinical Measurements: Goal: Will remain free from infection Outcome: Progressing Goal: Cardiovascular complication will be avoided Outcome: Progressing   Problem: Coping: Goal: Level of anxiety will decrease Outcome: Progressing   Problem: Elimination: Goal: Will not experience complications related to bowel motility Outcome: Progressing   Problem: Pain Managment: Goal: General experience of comfort will improve and/or be controlled Outcome: Progressing   Problem: Safety: Goal: Ability to remain free from injury will improve Outcome: Progressing   Problem: Skin Integrity: Goal: Risk for impaired skin integrity will decrease Outcome: Progressing

## 2023-05-28 NOTE — Plan of Care (Signed)

## 2023-05-28 NOTE — Progress Notes (Signed)
PROGRESS NOTE    Denise Watts   ZOX:[id number] DOB: January 16, 1956  DOA: 05/24/2023 Date of Service: 05/28/23 which is hospital day 4  PCP: Sherlyn Hay, [facility] course / significant events:   HPI:  Denise Watts is a 68 y.o. female with medical history significant of  multiple cancer including vocal cord squamous cell carcinoma, esophageal stricture, breast cancer, adrenal carcinoma, non-small cell lung cancer (currently is on chemotherapy for lung cancer), HLD, COPD, CAD, dCHF, depression with anxiety, A-fib,. PE and DVT on Lovenox, mycoplasma pneumonia, who presents with dysphagia. Pt has hx of vocal cord squamous cell carcinoma and esophageal stricture.  States that her difficulty swallowing has been progressively worsening recently. She feels okay for swallowing liquids and water, but not solid food. She is direct admit w/ plan for feeding tube placement.   01/22: admitted to hospitalist service w/ general surgery consult 01/23: placement G-tube, plan for tube feeds tomorrow, heparin gtt tomorrow (recent hx PE) 01/24: ok to use G-tube, continue heparin today and start lovenox tomorrow. If tolerating tube feeds expect discharge tomorrow AM  01/25: significant abd pain / distension this morning. KUB/CT notable for intraluminal gas, some peritoneal air c/w surgery, no obvious obstruction. Concern for ileus. Gtube to gravity. General surgery following. Question aspiration but CXR appears normal except known nodules, abn lung sounds seem related to bronchial, no desaturation, suspect possible aspiration pneumonitis  01/26: Per surgery, ok to restart tube feeds slowly, reengaged w/ RD to assist. Pt still uncomfortable and hasn't had BM, trial suppository/enema, will see if tolerates tube feeds before giving enteral laxatives. Pt encouraged to get OOB to chair / walk if able but she states she is probably not able d/t pain. Will repeat KUB. Will also repeat CXR, concern possible aspiration  given coughing, no wheezing, lungs sound better today overall      Consultants:  General surgery   Procedures/Surgeries: 05/25/23 G tube placed - Dr Everlene Farrier      ASSESSMENT & PLAN:  Dysphagia due to esophageal stricture s/p radiation for vocal cord squamous cell carcinoma: POD3 S/p G tube placement Cautious restart GTube feeds today   Abdominal pain / distension  Concern for ileus  No apparent complication from G-Tube placement  Cautious restart GTube feeds today  General surgery following Repeat KUB    Hx of DVT (deep venous thrombosis) and hx of pulmonary embolus Holding lovenox, back on heparin in case needing to go back to the OR    PAF (paroxysmal atrial fibrillation) Heart rate 87 Metoprolol held d/t soft BP Continue heparin for now Restart home lovenox as able     COPD (chronic obstructive pulmonary disease): Stable bronchodilators and as needed Mucinex Repeat CXR today, concern possible aspiration given coughing, no wheezing, lungs sound better today overall    Chronic diastolic CHF (congestive heart failure)  Clinically does not seem to have CHF exacerbation. watch volume status closely   HLD (hyperlipidemia) Crestor   CAD (coronary artery disease) Crestor   Non-small cell lung cancer  On chemotherapy, last dose was 2 weeks ago. Follow-up with Dr. Rana Snare   Depression with anxiety Current new home [place]        DVT prophylaxis: heparin  IV fluids: no continuous IV fluids  Nutrition: tube feeds to start today  Central lines / invasive devices: G tube, port  Code Status: FULL CODE ACP documentation reviewed:  none on file in [facility]  TOC needs: TBD Barriers to dispo / significant pending  items: start tube feeds, on heparin, anticipate discharge in 1-2 days / cleared by surgery              Subjective / Brief ROS:  Patient reports abdominal pain, nausea is about the same today  No shortness of breath but she notes coughing   Denies CP/SOB.  Denies new weakness.  Family Communication: support person at bedside on rounds     Objective Findings:  Vitals:   05/28/23 0057 05/28/23 0414 05/28/23 0500 05/28/23 0924  BP: 108/65 111/66  115/67  Pulse: 93 99  (!) 101  Resp: 16 16    Temp: 98 F (36.7 C) 97.8 F (36.6 C)    TempSrc:  Oral    SpO2: 94% 92%  91%  Weight:   54.9 kg   Height:        Intake/Output Summary (Last 24 hours) at 05/28/2023 1511 Last data filed at 05/28/2023 1022 Gross per 24 hour  Intake 243.9 ml  Output 1750 ml  Net -1506.1 ml   Filed Weights   05/26/23 0403 05/27/23 0344 05/28/23 0500  Weight: 55.8 kg 56.2 kg 54.9 kg    Examination:  Physical Exam Constitutional:      General: She is not in acute distress.    Appearance: She is ill-appearing.  Cardiovascular:     Rate and Rhythm: Normal rate and regular rhythm.  Pulmonary:     Effort: Pulmonary effort is normal. No respiratory distress.     Comments: Rhonchorous / bronchial breath sounds but improved from yesterday Abdominal:     General: Bowel sounds are normal. There is distension.     Tenderness: There is abdominal tenderness. There is no rebound.     Comments: Appears less distended compared to yesterday   Musculoskeletal:     Right lower leg: No edema.     Left lower leg: No edema.  Skin:    General: Skin is warm and dry.  Neurological:     Mental Status: She is alert. Mental status is at baseline.  Psychiatric:        Mood and Affect: Mood normal.          Scheduled Medications:   benzonatate  200 mg Oral TID   Chlorhexidine Gluconate Cloth  6 each Topical Q0600   feeding supplement (OSMOLITE 1.5 CAL)  237 mL Per Tube 5 X Daily   fentaNYL  1 patch Transdermal Q72H   free water  60 mL Per Tube 5 X Daily   multivitamin with minerals  1 tablet Per Tube Daily   omeprazole  40 mg Oral Daily   rOPINIRole  0.25 mg Oral QHS   rosuvastatin  40 mg Oral Daily   sodium chloride flush  10-40 mL  Intracatheter Q12H   thiamine  100 mg Per Tube Daily    Continuous Infusions:  heparin 650 Units/hr (05/27/23 1722)     PRN Medications:  albuterol, bisacodyl, dextromethorphan-guaiFENesin, hydrALAZINE, morphine injection, ondansetron (ZOFRAN) IV, sodium phosphate  Antimicrobials from admission:  Anti-infectives (From admission, onward)    Start     Dose/Rate Route Frequency Ordered Stop   05/25/23 2200  cefoTEtan (CEFOTAN) 2 g in sodium chloride 0.9 % 100 mL IVPB        2 g 200 mL/hr over 30 Minutes Intravenous Every 12 hours 05/25/23 1923 05/26/23 1910   05/25/23 1200  cefoTEtan (CEFOTAN) 2 g in sodium chloride 0.9 % 100 mL IVPB        2 g 200  mL/hr over 30 Minutes Intravenous  Once 05/24/23 1850 05/25/23 1905           Data Reviewed:  I have personally reviewed the following...  CBC: Recent Labs  Lab 05/22/23 1140 05/24/23 1903 05/25/23 0645 05/26/23 0533 05/26/23 1619 05/27/23 0525 05/28/23 0804  WBC 3.8*   < > 2.6* 5.4 6.7 7.7 5.3  NEUTROABS 2.4  --   --   --   --   --   --   HGB 10.2*   < > 9.5* 9.3* 9.6* 10.1* 9.9*  HCT 31.5*   < > 30.2* 29.8* 30.3* 31.7* 31.8*  MCV 100.3*   < > 103.8* 104.6* 102.4* 103.9* 104.6*  PLT 255   < > 172 162 199 202 189   < > = values in this interval not displayed.   Basic Metabolic Panel: Recent Labs  Lab 05/22/23 1140 05/24/23 1903 05/25/23 0645 05/26/23 0533 05/26/23 1120 05/26/23 1619 05/27/23 0525 05/27/23 1857 05/28/23 0804  NA 134* 138 139 139  --   --   --   --  139  K 3.3* 4.0 3.3* 4.0  --   --   --   --  3.9  CL 99 98 103 102  --   --   --   --  105  CO2 25 27 28 25   --   --   --   --  29  GLUCOSE 84 100* 94 141*  --   --   --   --  85  BUN 10 8 <5* <5*  --   --   --   --  6*  CREATININE 0.74 0.69 0.63 0.72  --   --   --   --  0.45  CALCIUM 8.7* 9.0 8.4* 8.6*  --   --   --   --  8.2*  MG  --  2.2  --   --  1.9 1.9 2.2 2.0  --   PHOS  --  3.3  --   --  3.1 2.6 2.3* 2.3*  --    GFR: Estimated  Creatinine Clearance: 50.1 mL/min (by C-G formula based on SCr of 0.45 mg/dL). Liver Function Tests: Recent Labs  Lab 05/22/23 1140 05/26/23 0533  AST 23 23  ALT 12 10  ALKPHOS 60 41  BILITOT 0.7 0.5  PROT 5.9* 5.0*  ALBUMIN 3.1* 3.0*   No results for input(s): "LIPASE", "AMYLASE" in the last 168 hours. No results for input(s): "AMMONIA" in the last 168 hours. Coagulation Profile: Recent Labs  Lab 05/24/23 1903  INR 1.1   Cardiac Enzymes: No results for input(s): "CKTOTAL", "CKMB", "CKMBINDEX", "TROPONINI" in the last 168 hours. BNP (last 3 results) No results for input(s): "PROBNP" in the last 8760 hours. HbA1C: No results for input(s): "HGBA1C" in the last 72 hours. CBG: Recent Labs  Lab 05/27/23 1121 05/27/23 1716 05/28/23 0058 05/28/23 0925 05/28/23 1210  GLUCAP 107* 70 101* 78 116*   Lipid Profile: No results for input(s): "CHOL", "HDL", "LDLCALC", "TRIG", "CHOLHDL", "LDLDIRECT" in the last 72 hours. Thyroid Function Tests: No results for input(s): "TSH", "T4TOTAL", "FREET4", "T3FREE", "THYROIDAB" in the last 72 hours. Anemia Panel: No results for input(s): "VITAMINB12", "FOLATE", "FERRITIN", "TIBC", "IRON", "RETICCTPCT" in the last 72 hours. Most Recent Urinalysis On File:     Component Value Date/Time   COLORURINE YELLOW (A) 05/10/2023 1629   APPEARANCEUR CLEAR (A) 05/10/2023 1629   APPEARANCEUR Clear 02/27/2023 0959   LABSPEC 1.013 05/10/2023  1629   PHURINE 5.0 05/10/2023 1629   GLUCOSEU 50 (A) 05/10/2023 1629   HGBUR NEGATIVE 05/10/2023 1629   BILIRUBINUR NEGATIVE 05/10/2023 1629   BILIRUBINUR Negative 02/27/2023 0959   KETONESUR 5 (A) 05/10/2023 1629   PROTEINUR NEGATIVE 05/10/2023 1629   UROBILINOGEN 0.2 11/05/2021 1736   NITRITE NEGATIVE 05/10/2023 1629   LEUKOCYTESUR NEGATIVE 05/10/2023 1629   Sepsis Labs: @LABRCNTIP (procalcitonin:4,lacticidven:4) Microbiology: No results found for this or any previous visit (from the past 240 hours).     Radiology Studies last 3 days: CT ABDOMEN PELVIS WO CONTRAST Result Date: 05/27/2023 CLINICAL DATA:  Abdominal pain. Recent gastrostomy tube placement. Lung carcinoma. * Tracking Code: BO * EXAM: CT ABDOMEN AND PELVIS WITHOUT CONTRAST TECHNIQUE: Multidetector CT imaging of the abdomen and pelvis was performed following the standard protocol without IV contrast. RADIATION DOSE REDUCTION: This exam was performed according to the departmental dose-optimization program which includes automated exposure control, adjustment of the mA and/or kV according to patient size and/or use of iterative reconstruction technique. COMPARISON:  12/09/2021 FINDINGS: Lower chest: Tiny bilateral pleural effusions. Hepatobiliary: No mass visualized on this unenhanced exam. Gallbladder is unremarkable. No evidence of biliary ductal dilatation. Pancreas: No mass or inflammatory process visualized on this unenhanced exam. Spleen:  Within normal limits in size. Adrenals/Urinary tract: No evidence of urolithiasis or hydronephrosis. Unremarkable unopacified urinary bladder. Stomach/Bowel: Gastrostomy tube is seen appropriate position in the gastric body. Tiny amount of free intraperitoneal air as well as gas within the anterior abdominal wall adjacent to the gastrostomy is attributable to recent gastrostomy tube placement. No evidence of bowel obstruction, inflammatory process, or abnormal fluid collections. Diverticulosis is seen mainly involving the sigmoid colon, however there is no evidence of diverticulitis. Vascular/Lymphatic: No pathologically enlarged lymph nodes identified. No evidence of abdominal aortic aneurysm. Reproductive:  No mass or other significant abnormality. Other:  None. Musculoskeletal:  No suspicious bone lesions identified. IMPRESSION: Gastrostomy tube in appropriate position. Tiny amount of free intraperitoneal air and gas within the anterior abdominal wall, attributable to recent gastrostomy tube placement. No  other acute findings. Colonic diverticulosis, without radiographic evidence of diverticulitis. Electronically Signed   By: Danae Orleans M.D.   On: 05/27/2023 13:19   DG Abd 1 View Result Date: 05/27/2023 CLINICAL DATA:  Abdominal pain and distension. EXAM: ABDOMEN - 1 VIEW COMPARISON:  01/25/2023 FINDINGS: Prominent gaseous distention of the stomach. Catheter overlies the left abdomen, potentially a gastrostomy tube. Gas dilated bowel loop overlying the right upper abdomen may reflect distended transverse colon. Diffuse gaseous small bowel distension noted. Gas and stool noted in the sigmoid colon with prominent gas in stool visible in the rectum. IMPRESSION: Prominent gaseous distention of the stomach with diffuse gaseous small bowel distension. Gas dilated bowel loop overlying the right upper abdomen may reflect distended transverse colon. Findings likely reflect ileus. CT imaging could be used to more definitively evaluate. Electronically Signed   By: Kennith Center M.D.   On: 05/27/2023 11:10   DG Chest Port 1 View Result Date: 05/27/2023 CLINICAL DATA:  Shortness of breath.  Abnormal lung sounds. EXAM: PORTABLE CHEST 1 VIEW COMPARISON:  05/10/2023 FINDINGS: Scattered bilateral pulmonary nodules again noted. No focal consolidation or pulmonary edema. No substantial pleural effusion. The cardiopericardial silhouette is within normal limits for size. Right Port-A-Cath remains in place. No acute bony abnormality. IMPRESSION: Scattered bilateral pulmonary nodules again noted. No acute cardiopulmonary findings. Electronically Signed   By: Kennith Center M.D.   On: 05/27/2023 11:06  Sunnie Nielsen, DO [facility] 05/28/2023, 3:11 PM    Dictation software may have been used to generate the above note. Typos may occur and escape review in typed/dictated notes. Please contact Dr Lyn Hollingshead directly for clarity if needed.  Staff may message me via secure chat in Epic  but this may not  receive an immediate response,  please page me for urgent matters!  If 7PM-7AM, please contact night coverage [email or web address]

## 2023-05-29 ENCOUNTER — Inpatient Hospital Stay: Payer: Medicare HMO

## 2023-05-29 ENCOUNTER — Other Ambulatory Visit: Payer: Medicare HMO

## 2023-05-29 ENCOUNTER — Ambulatory Visit: Payer: Medicare HMO

## 2023-05-29 ENCOUNTER — Ambulatory Visit: Payer: Medicare HMO | Admitting: Oncology

## 2023-05-29 DIAGNOSIS — R1310 Dysphagia, unspecified: Secondary | ICD-10-CM | POA: Diagnosis not present

## 2023-05-29 LAB — GLUCOSE, CAPILLARY
Glucose-Capillary: 117 mg/dL — ABNORMAL HIGH (ref 70–99)
Glucose-Capillary: 119 mg/dL — ABNORMAL HIGH (ref 70–99)
Glucose-Capillary: 122 mg/dL — ABNORMAL HIGH (ref 70–99)
Glucose-Capillary: 135 mg/dL — ABNORMAL HIGH (ref 70–99)
Glucose-Capillary: 162 mg/dL — ABNORMAL HIGH (ref 70–99)

## 2023-05-29 LAB — BASIC METABOLIC PANEL
Anion gap: 6 (ref 5–15)
BUN: 7 mg/dL — ABNORMAL LOW (ref 8–23)
CO2: 28 mmol/L (ref 22–32)
Calcium: 8.1 mg/dL — ABNORMAL LOW (ref 8.9–10.3)
Chloride: 106 mmol/L (ref 98–111)
Creatinine, Ser: 0.4 mg/dL — ABNORMAL LOW (ref 0.44–1.00)
GFR, Estimated: >60 mL/min (ref >60)
Glucose, Bld: 124 mg/dL — ABNORMAL HIGH (ref 70–99)
Potassium: 3.7 mmol/L (ref 3.5–5.1)
Sodium: 140 mmol/L (ref 135–145)

## 2023-05-29 LAB — CBC
HCT: 33.4 % — ABNORMAL LOW (ref 36.0–46.0)
Hemoglobin: 10.5 g/dL — ABNORMAL LOW (ref 12.0–15.0)
MCH: 32.7 pg (ref 26.0–34.0)
MCHC: 31.4 g/dL (ref 30.0–36.0)
MCV: 104 fL — ABNORMAL HIGH (ref 80.0–100.0)
Platelets: 189 10*3/uL (ref 150–400)
RBC: 3.21 MIL/uL — ABNORMAL LOW (ref 3.87–5.11)
RDW: 15.3 % (ref 11.5–15.5)
WBC: 3.8 10*3/uL — ABNORMAL LOW (ref 4.0–10.5)
nRBC: 0 % (ref 0.0–0.2)

## 2023-05-29 LAB — HEPARIN LEVEL (UNFRACTIONATED): Heparin Unfractionated: 0.24 [IU]/mL — ABNORMAL LOW (ref 0.30–0.70)

## 2023-05-29 MED ORDER — ADULT MULTIVITAMIN W/MINERALS CH
1.0000 | ORAL_TABLET | Freq: Every day | ORAL | Status: DC
  Administered 2023-05-29: 1 via ORAL
  Filled 2023-05-29: qty 1

## 2023-05-29 MED ORDER — OSMOLITE 1.5 CAL PO LIQD
237.0000 mL | Freq: Every day | ORAL | Status: DC
  Administered 2023-05-30 – 2023-05-31 (×7): 237 mL

## 2023-05-29 MED ORDER — DOCUSATE SODIUM 50 MG/5ML PO LIQD
100.0000 mg | Freq: Two times a day (BID) | ORAL | Status: DC
  Administered 2023-05-29 – 2023-05-30 (×4): 100 mg
  Filled 2023-05-29 (×5): qty 10

## 2023-05-29 MED ORDER — THIAMINE MONONITRATE 100 MG PO TABS
100.0000 mg | ORAL_TABLET | Freq: Every day | ORAL | Status: AC
  Administered 2023-05-29 – 2023-06-01 (×4): 100 mg via ORAL
  Filled 2023-05-29 (×4): qty 1

## 2023-05-29 MED ORDER — HEPARIN BOLUS VIA INFUSION
1000.0000 [IU] | Freq: Once | INTRAVENOUS | Status: AC
  Administered 2023-05-29: 1000 [IU] via INTRAVENOUS
  Filled 2023-05-29: qty 1000

## 2023-05-29 MED ORDER — ENOXAPARIN SODIUM 60 MG/0.6ML IJ SOSY
50.0000 mg | PREFILLED_SYRINGE | Freq: Two times a day (BID) | INTRAMUSCULAR | Status: DC
  Administered 2023-05-29 – 2023-06-05 (×15): 50 mg via SUBCUTANEOUS
  Filled 2023-05-29 (×15): qty 0.6

## 2023-05-29 MED ORDER — SENNOSIDES 8.8 MG/5ML PO SYRP
10.0000 mL | ORAL_SOLUTION | Freq: Once | ORAL | Status: AC
  Administered 2023-05-29: 10 mL
  Filled 2023-05-29: qty 10

## 2023-05-29 MED ORDER — DOCUSATE SODIUM 50 MG/5ML PO LIQD: 100.0000 mg | Freq: Two times a day (BID) | ORAL | Status: DC

## 2023-05-29 MED ORDER — ADULT MULTIVITAMIN LIQUID CH
15.0000 mL | Freq: Every day | ORAL | Status: DC
  Administered 2023-05-30 – 2023-06-05 (×7): 15 mL via ORAL
  Filled 2023-05-29 (×7): qty 15

## 2023-05-29 NOTE — Progress Notes (Signed)
PHARMACY - ANTICOAGULATION CONSULT NOTE  Pharmacy Consult for Heparin Infusion Indication: atrial fibrillation, VTE treatment  Patient Measurements: Height: 5\' 1"  (154.9 cm) Weight: 53.6 kg (118 lb 2.7 oz) IBW/kg (Calculated) : 47.8 Heparin Dosing Weight: 56.2 kg  Labs: Recent Labs    05/27/23 0525 05/28/23 0106 05/28/23 0804 05/29/23 0634  HGB 10.1*  --  9.9* 10.5*  HCT 31.7*  --  31.8* 33.4*  PLT 202  --  189 189  HEPARINUNFRC 0.36 0.47 0.37 0.24*  CREATININE  --   --  0.45 0.40*    Estimated Creatinine Clearance: 50.1 mL/min (A) (by C-G formula based on SCr of 0.4 mg/dL (L)).  Medical History: Past Medical History:  Diagnosis Date   Adenomatous colon polyp    Adrenal carcinoma, right (HCC) 12/28/2016   a.) high grade carcinoma with extensive necrosis --> s/p adrenalectomy   Anxiety    Aortic atherosclerosis (HCC)    Arthritis of right knee    Atrial fibrillation and flutter (HCC)    a.) CHA2DS2-VASc = 3 (age, sex, vascular disease history) as of 02/13/2023; b.) cardiac rate/rhythm maintained intrinsically without pharmacological intervention; no chronic OAC (does take low dose ASA)   B12 deficiency    Breast cancer (HCC) 1997   a.) stage IB (cT1a, cN0, cM0, G3, ER+, PR-, HER2-); s/p lumpectomy + XRT   COPD (chronic obstructive pulmonary disease) (HCC)    Coronary artery disease    a.) cCTA 12/09/2021: Ca2+ = 135 (80th %'ile for age/sex/race match contol) --> 25-49% pLAD and RCA distributions   Depression    Diastolic dysfunction    a.) TTE 12/11/2020, no RWMAs, norm RVSF, G1DD; b.) TTE 12/14/2021: EF 55-60, no RWMAs, norm RVSF, G1DD; c.) TTE 01/27/2023: EF 50-55%, no RWMAs, G1DD, mild RAE, mild MR, mod TR   Dry cough 03/02/2017   Dyspnea    Full dentures    GERD (gastroesophageal reflux disease)    History of hiatal hernia    HLD (hyperlipidemia)    Invasive carcinoma of breast (HCC) 11/15/2017   a.) path (+) for G3, ER -, PR -, HER2/neu - ; s/p lumpectomy  01/17/2018 + 4 cycles TC systemic chemotherapy + XRT   Long term current use of aspirin    Personal history of chemotherapy    Personal history of radiation therapy    Pneumothorax of left lung after biopsy 01/25/2023   a.) hypoxic in PACU requiring re-intubation --> transferred to ICU --> chest tube placed by PCCM   RLS (restless legs syndrome)    a.) on ropinirole   Skin cancer    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 female presenting with dysphagia. PMH significant for COPD, CAD, anxiety and depression, atrial fibrillation/flutter, HDS, RLS, dysphagia, breast cancer s/p surgery and radiation, carcinoma of the vocal cord sp surgery and radiation (subsequent dysphagia), adrenal carcinoma of unknown primary, now with non-small cell lung cancer now on chemotherapy and radiation therapy. Patient was on enoxaparin PTA per chart review. Patient was previously on heparin infusion which was switched to enoxaparin. Last dose of enoxaparin was 1/25 0858. Pharmacy has been consulted to reinitiate and manage heparin infusion.  Baseline Labs: Hgb 10.1, Hct 31.7, Plt 202   Goal of Therapy:  Heparin level 0.3-0.7 units/ml Monitor platelets by anticoagulation protocol: Yes  1/26 0106 HL 0.47, therapeutic x 1 1/26 0804 HL 0.37,  therapeutic x 2 1/27 0634 HL 0.24, subtherapeutic   Plan:  Heparin 1000 unit IV bolus and increase infusion rate to 750 units/hr Recheck HL in 6 hours Daily CBC per protocol  [name] 05/29/2023 7:47 AM

## 2023-05-29 NOTE — Progress Notes (Signed)
PROGRESS NOTE    Denise Watts   HQI:[id number] DOB: 1956-04-04  DOA: 05/24/2023 Date of Service: 05/29/23 which is hospital day 5  PCP: Sherlyn Hay, DO    Hospital course / significant events:   HPI:  Denise Watts is a 68 y.o. female with medical history significant of  multiple cancer including vocal cord squamous cell carcinoma, esophageal stricture, breast cancer, adrenal carcinoma, non-small cell lung cancer (currently is on chemotherapy for lung cancer), HLD, COPD, CAD, dCHF, depression with anxiety, A-fib,. PE and DVT on Lovenox, mycoplasma pneumonia, who presents with dysphagia. Pt has hx of vocal cord squamous cell carcinoma and esophageal stricture.  States that her difficulty swallowing has been progressively worsening recently. She feels okay for swallowing liquids and water, but not solid food. She is direct admit w/ plan for feeding tube placement.   01/22: admitted to hospitalist service w/ general surgery consult 01/23: placement G-tube, plan for tube feeds tomorrow, heparin gtt tomorrow (recent hx PE) 01/24: ok to use G-tube, continue heparin today and start lovenox tomorrow. If tolerating tube feeds expect discharge tomorrow AM  01/25: significant abd pain / distension this morning. KUB/CT notable for intraluminal gas, some peritoneal air c/w surgery, no obvious obstruction. Concern for ileus. Gtube to gravity. General surgery following. Question aspiration but CXR appears normal except known nodules, abn lung sounds seem related to bronchial, no desaturation, suspect possible aspiration pneumonitis  01/26: Per surgery, ok to restart tube feeds slowly, reengaged w/ RD to assist. Pt still uncomfortable and hasn't had BM, trial suppository/enema, will see if tolerates tube feeds before giving enteral laxatives. Pt encouraged to get OOB to chair / walk if able but she states she is probably not able d/t pain. Will repeat KUB. Will also repeat CXR, concern possible aspiration  given coughing, no wheezing, lungs sound better today overall  01/27: finally had BM today, increasing tube feeds later today/tomorrow and work on ambulating.      Consultants:  General surgery   Procedures/Surgeries: 05/25/23 G tube placed - Dr Everlene Farrier      ASSESSMENT & PLAN:  Dysphagia due to esophageal stricture s/p radiation for vocal cord squamous cell carcinoma: POD3 S/p G tube placement Cautious restart GTube feeds   Abdominal pain / distension - improved Ileus - resolved No apparent complication from G-Tube placement  Cautious restart GTube feeds  General surgery following BM today 05/29/23, continue bowel regimen via Gtube    Hx of DVT (deep venous thrombosis) and hx of pulmonary embolus Restart lovenox    PAF (paroxysmal atrial fibrillation) Heart rate 87 Metoprolol held d/t soft BP but rate is abck up, BP ok but consider resume low dose beta blocker if remains tachycardic Restart home lovenox    COPD (chronic obstructive pulmonary disease): Stable bronchodilators and as needed Mucinex   Chronic diastolic CHF (congestive heart failure)  Clinically does not seem to have CHF exacerbation. watch volume status closely   HLD (hyperlipidemia) Crestor   CAD (coronary artery disease) Crestor   Non-small cell lung cancer  On chemotherapy, last dose was 2 weeks ago. Follow-up with Dr. Rana Snare   Depression with anxiety Current new home [place]        DVT prophylaxis: heparin  IV fluids: no continuous IV fluids  Nutrition: tube feeds to start today  Central lines / invasive devices: G tube, port  Code Status: FULL CODE ACP documentation reviewed:  none on file in [facility]  TOC needs: TBD Barriers to dispo / significant  pending items: start tube feeds, if ambulating okay and abdominal pain improved may dc tomorrow              Subjective / Brief ROS:  Patient reports abdominal pain, nausea is about the same today  Few hours after I saw  patient, RN reports pt had BM and feeling improved  Denies CP/SOB.  Denies new weakness.  Family Communication: support person at bedside on rounds     Objective Findings:  Vitals:   05/29/23 0500 05/29/23 0515 05/29/23 0845 05/29/23 1201  BP:  104/62 (!) 90/57 110/63  Pulse:  (!) 106 100 (!) 102  Resp:  20 19 18   Temp:  (!) 97.5 F (36.4 C) 98 F (36.7 C) (!) 97.5 F (36.4 C)  TempSrc:  Oral Oral   SpO2:  99% 96% 98%  Weight: 53.6 kg     Height:        Intake/Output Summary (Last 24 hours) at 05/29/2023 1310 Last data filed at 05/29/2023 1018 Gross per 24 hour  Intake 240 ml  Output 1500 ml  Net -1260 ml   Filed Weights   05/27/23 0344 05/28/23 0500 05/29/23 0500  Weight: 56.2 kg 54.9 kg 53.6 kg    Examination:  Physical Exam Constitutional:      General: She is not in acute distress.    Appearance: She is not ill-appearing.  Cardiovascular:     Rate and Rhythm: Normal rate and regular rhythm.  Pulmonary:     Effort: Pulmonary effort is normal. No respiratory distress.     Comments: Rhonchorous / bronchial breath sounds but improved from yesterday Abdominal:     General: Bowel sounds are normal. There is distension.     Tenderness: There is abdominal tenderness. There is no rebound.     Comments: Appears less distended compared to yesterday / day before  Musculoskeletal:     Right lower leg: No edema.     Left lower leg: No edema.  Skin:    General: Skin is warm and dry.  Neurological:     Mental Status: She is alert. Mental status is at baseline.  Psychiatric:        Mood and Affect: Mood normal.          Scheduled Medications:   benzonatate  200 mg Oral TID   Chlorhexidine Gluconate Cloth  6 each Topical Q0600   docusate  100 mg Per Tube BID   enoxaparin (LOVENOX) injection  50 mg Subcutaneous Q12H   [START ON 05/30/2023] feeding supplement (OSMOLITE 1.5 CAL)  237 mL Per Tube 5 X Daily   fentaNYL  1 patch Transdermal Q72H   free water  60 mL  Per Tube 5 X Daily   [START ON 05/30/2023] multivitamin  15 mL Oral Daily   omeprazole  40 mg Oral Daily   rOPINIRole  0.25 mg Oral QHS   rosuvastatin  40 mg Oral Daily   sennosides  10 mL Per Tube Once   sodium chloride flush  10-40 mL Intracatheter Q12H   thiamine  100 mg Oral Daily    Continuous Infusions:  feeding supplement (OSMOLITE 1.5 CAL) 1,000 mL (05/29/23 1205)     PRN Medications:  albuterol, bisacodyl, dextromethorphan-guaiFENesin, hydrALAZINE, morphine injection, ondansetron (ZOFRAN) IV, sodium phosphate  Antimicrobials from admission:  Anti-infectives (From admission, onward)    Start     Dose/Rate Route Frequency Ordered Stop   05/25/23 2200  cefoTEtan (CEFOTAN) 2 g in sodium chloride 0.9 % 100 mL  IVPB        2 g 200 mL/hr over 30 Minutes Intravenous Every 12 hours 05/25/23 1923 05/26/23 1910   05/25/23 1200  cefoTEtan (CEFOTAN) 2 g in sodium chloride 0.9 % 100 mL IVPB        2 g 200 mL/hr over 30 Minutes Intravenous  Once 05/24/23 1850 05/25/23 1905           Data Reviewed:  I have personally reviewed the following...  CBC: Recent Labs  Lab 05/26/23 0533 05/26/23 1619 05/27/23 0525 05/28/23 0804 05/29/23 0634  WBC 5.4 6.7 7.7 5.3 3.8*  HGB 9.3* 9.6* 10.1* 9.9* 10.5*  HCT 29.8* 30.3* 31.7* 31.8* 33.4*  MCV 104.6* 102.4* 103.9* 104.6* 104.0*  PLT 162 199 202 189 189   Basic Metabolic Panel: Recent Labs  Lab 05/24/23 1903 05/25/23 0645 05/26/23 0533 05/26/23 1120 05/26/23 1619 05/27/23 0525 05/27/23 1857 05/28/23 0804 05/29/23 0634  NA 138 139 139  --   --   --   --  139 140  K 4.0 3.3* 4.0  --   --   --   --  3.9 3.7  CL 98 103 102  --   --   --   --  105 106  CO2 27 28 25   --   --   --   --  29 28  GLUCOSE 100* 94 141*  --   --   --   --  85 124*  BUN 8 <5* <5*  --   --   --   --  6* 7*  CREATININE 0.69 0.63 0.72  --   --   --   --  0.45 0.40*  CALCIUM 9.0 8.4* 8.6*  --   --   --   --  8.2* 8.1*  MG 2.2  --   --  1.9 1.9 2.2 2.0   --   --   PHOS 3.3  --   --  3.1 2.6 2.3* 2.3*  --   --    GFR: Estimated Creatinine Clearance: 50.1 mL/min (A) (by C-G formula based on SCr of 0.4 mg/dL (L)). Liver Function Tests: Recent Labs  Lab 05/26/23 0533  AST 23  ALT 10  ALKPHOS 41  BILITOT 0.5  PROT 5.0*  ALBUMIN 3.0*   No results for input(s): "LIPASE", "AMYLASE" in the last 168 hours. No results for input(s): "AMMONIA" in the last 168 hours. Coagulation Profile: Recent Labs  Lab 05/24/23 1903  INR 1.1   Cardiac Enzymes: No results for input(s): "CKTOTAL", "CKMB", "CKMBINDEX", "TROPONINI" in the last 168 hours. BNP (last 3 results) No results for input(s): "PROBNP" in the last 8760 hours. HbA1C: No results for input(s): "HGBA1C" in the last 72 hours. CBG: Recent Labs  Lab 05/28/23 1611 05/28/23 2048 05/29/23 0031 05/29/23 0516 05/29/23 1202  GLUCAP 138* 95 122* 162* 119*   Lipid Profile: No results for input(s): "CHOL", "HDL", "LDLCALC", "TRIG", "CHOLHDL", "LDLDIRECT" in the last 72 hours. Thyroid Function Tests: No results for input(s): "TSH", "T4TOTAL", "FREET4", "T3FREE", "THYROIDAB" in the last 72 hours. Anemia Panel: No results for input(s): "VITAMINB12", "FOLATE", "FERRITIN", "TIBC", "IRON", "RETICCTPCT" in the last 72 hours. Most Recent Urinalysis On File:     Component Value Date/Time   COLORURINE YELLOW (A) 05/10/2023 1629   APPEARANCEUR CLEAR (A) 05/10/2023 1629   APPEARANCEUR Clear 02/27/2023 0959   LABSPEC 1.013 05/10/2023 1629   PHURINE 5.0 05/10/2023 1629   GLUCOSEU 50 (A) 05/10/2023 1629   HGBUR  NEGATIVE 05/10/2023 1629   BILIRUBINUR NEGATIVE 05/10/2023 1629   BILIRUBINUR Negative 02/27/2023 0959   KETONESUR 5 (A) 05/10/2023 1629   PROTEINUR NEGATIVE 05/10/2023 1629   UROBILINOGEN 0.2 11/05/2021 1736   NITRITE NEGATIVE 05/10/2023 1629   LEUKOCYTESUR NEGATIVE 05/10/2023 1629   Sepsis Labs: @LABRCNTIP (procalcitonin:4,lacticidven:4) Microbiology: No results found for this or  any previous visit (from the past 240 hours).    Radiology Studies last 3 days: DG Chest Port 1 View Result Date: 05/28/2023 CLINICAL DATA:  Dysphagia, cough. EXAM: PORTABLE CHEST 1 VIEW COMPARISON:  Radiograph yesterday.  CT 05/10/2023 FINDINGS: Accessed right chest port in the SVC. Normal heart size with stable mediastinal contours. Scattered nodular densities are without significant interval change. No confluent airspace disease. No significant pleural effusion. No pneumothorax. IMPRESSION: 1. No acute chest findings. 2. Scattered nodular densities are without significant interval change. Electronically Signed   By: Narda Rutherford M.D.   On: 05/28/2023 18:57   DG Abd 1 View Result Date: 05/28/2023 CLINICAL DATA:  Dysphagia. EXAM: ABDOMEN - 1 VIEW COMPARISON:  CT yesterday FINDINGS: Gastrostomy tube in the upper abdomen. Scattered air throughout nondilated small bowel centrally, no evidence of obstruction. No significant formed stool in the colon. No radiopaque calculi. IMPRESSION: Nonobstructive bowel gas pattern. Electronically Signed   By: Narda Rutherford M.D.   On: 05/28/2023 18:55   CT ABDOMEN PELVIS WO CONTRAST Result Date: 05/27/2023 CLINICAL DATA:  Abdominal pain. Recent gastrostomy tube placement. Lung carcinoma. * Tracking Code: BO * EXAM: CT ABDOMEN AND PELVIS WITHOUT CONTRAST TECHNIQUE: Multidetector CT imaging of the abdomen and pelvis was performed following the standard protocol without IV contrast. RADIATION DOSE REDUCTION: This exam was performed according to the departmental dose-optimization program which includes automated exposure control, adjustment of the mA and/or kV according to patient size and/or use of iterative reconstruction technique. COMPARISON:  12/09/2021 FINDINGS: Lower chest: Tiny bilateral pleural effusions. Hepatobiliary: No mass visualized on this unenhanced exam. Gallbladder is unremarkable. No evidence of biliary ductal dilatation. Pancreas: No mass or  inflammatory process visualized on this unenhanced exam. Spleen:  Within normal limits in size. Adrenals/Urinary tract: No evidence of urolithiasis or hydronephrosis. Unremarkable unopacified urinary bladder. Stomach/Bowel: Gastrostomy tube is seen appropriate position in the gastric body. Tiny amount of free intraperitoneal air as well as gas within the anterior abdominal wall adjacent to the gastrostomy is attributable to recent gastrostomy tube placement. No evidence of bowel obstruction, inflammatory process, or abnormal fluid collections. Diverticulosis is seen mainly involving the sigmoid colon, however there is no evidence of diverticulitis. Vascular/Lymphatic: No pathologically enlarged lymph nodes identified. No evidence of abdominal aortic aneurysm. Reproductive:  No mass or other significant abnormality. Other:  None. Musculoskeletal:  No suspicious bone lesions identified. IMPRESSION: Gastrostomy tube in appropriate position. Tiny amount of free intraperitoneal air and gas within the anterior abdominal wall, attributable to recent gastrostomy tube placement. No other acute findings. Colonic diverticulosis, without radiographic evidence of diverticulitis. Electronically Signed   By: Danae Orleans M.D.   On: 05/27/2023 13:19   DG Abd 1 View Result Date: 05/27/2023 CLINICAL DATA:  Abdominal pain and distension. EXAM: ABDOMEN - 1 VIEW COMPARISON:  01/25/2023 FINDINGS: Prominent gaseous distention of the stomach. Catheter overlies the left abdomen, potentially a gastrostomy tube. Gas dilated bowel loop overlying the right upper abdomen may reflect distended transverse colon. Diffuse gaseous small bowel distension noted. Gas and stool noted in the sigmoid colon with prominent gas in stool visible in the rectum. IMPRESSION: Prominent gaseous distention of  the stomach with diffuse gaseous small bowel distension. Gas dilated bowel loop overlying the right upper abdomen may reflect distended transverse colon.  Findings likely reflect ileus. CT imaging could be used to more definitively evaluate. Electronically Signed   By: Kennith Center M.D.   On: 05/27/2023 11:10   DG Chest Port 1 View Result Date: 05/27/2023 CLINICAL DATA:  Shortness of breath.  Abnormal lung sounds. EXAM: PORTABLE CHEST 1 VIEW COMPARISON:  05/10/2023 FINDINGS: Scattered bilateral pulmonary nodules again noted. No focal consolidation or pulmonary edema. No substantial pleural effusion. The cardiopericardial silhouette is within normal limits for size. Right Port-A-Cath remains in place. No acute bony abnormality. IMPRESSION: Scattered bilateral pulmonary nodules again noted. No acute cardiopulmonary findings. Electronically Signed   By: Kennith Center M.D.   On: 05/27/2023 11:06         Sunnie Nielsen, DO [facility] 05/29/2023, 1:10 PM    Dictation software may have been used to generate the above note. Typos may occur and escape review in typed/dictated notes. Please contact Dr Lyn Hollingshead directly for clarity if needed.  Staff may message me via secure chat in Epic  but this may not receive an immediate response,  please page me for urgent matters!  If 7PM-7AM, please contact night coverage [email or web address]

## 2023-05-29 NOTE — Plan of Care (Signed)
  Problem: Education: Goal: Knowledge of General Education information will improve Description: Including pain rating scale, medication(s)/side effects and non-pharmacologic comfort measures Outcome: Progressing   Problem: Clinical Measurements: Goal: Ability to maintain clinical measurements within normal limits will improve Outcome: Progressing   Problem: Activity: Goal: Risk for activity intolerance will decrease Outcome: Progressing   Problem: Coping: Goal: Level of anxiety will decrease Outcome: Progressing   Problem: Pain Managment: Goal: General experience of comfort will improve and/or be controlled Outcome: Progressing   Problem: Safety: Goal: Ability to remain free from injury will improve Outcome: Progressing   Problem: Skin Integrity: Goal: Risk for impaired skin integrity will decrease Outcome: Progressing

## 2023-05-29 NOTE — Progress Notes (Signed)
Nutrition Follow-up  DOCUMENTATION CODES:   Non-severe (moderate) malnutrition in context of chronic illness  INTERVENTION:   -TF via g-tube:   Osmolite 1.5 @ 50 ml/hr   60 ml free water flush 4 times per day  Tube feeding regimen provides 1800 kcal (100% of needs), 75 grams of protein, and 1394 ml of H2O.    -MVI with minerals daily -100 mg thiamine daily -Monitor Mg, K, and Phos and replete as needed secondary to high refeeding risk  -Transition to bolus feedings:    237 ml (1 carton) Osmolite 1.5 5 times daily   30 ml free water flush before and after each feeding administration   Tube feeding regimen provides 1775 kcal (100% of needs), 75 grams of protein, and 905 ml of H2O.  Total free water: 1205 ml daily  NUTRITION DIAGNOSIS:   Moderate Malnutrition related to chronic illness (vocal cord squamous cell carcinoma, COPD, CHF) as evidenced by mild fat depletion, moderate fat depletion, mild muscle depletion, moderate muscle depletion.  Ongoing  GOAL:   Patient will meet greater than or equal to 90% of their needs  Progressing  MONITOR:   PO intake, TF tolerance  REASON FOR ASSESSMENT:   Consult Enteral/tube feeding initiation and management  ASSESSMENT:   Pt with medical history significant of  multiple cancer including vocal cord squamous cell carcinoma, esophageal stricture, breast cancer, adrenal carcinoma, non-small cell lung cancer (currently is on chemotherapy for lung cancer), HLD, COPD, CAD, dCHF, depression with anxiety, A-fib,. PE and DVT on Lovenox, mycoplasma pneumonia, who presents with dysphagia.  1/23- s/p g-tube placement  1/25- increased work of breathing with concern for aspiration pneumonia, abdominal x-ray revealed distended loops of bowel, g-tube in correct position per CT, g-tube placed to gravity 1/25-TF re-restarted  Reviewed I/O's: -540 ml x 24 hours and +1.1 L since admission  UOP: 900 ml x 24 hours  Case discussed with RN and  MD. TF were resumed yesterday at 50 ml/hr. Regimen provides 1800 kcals, 75 grams protein, and 914 ml (1394 ml free water) daily. Per RN, pt with no further vomiting, but still with some abdominal pain. No BM yet, but RN just provided suppository.   Per TOC notes, family has requested teaching on TF prior to discharge. [employer] provided teaching to pt in hospital on 05/26/23; any further teaching to done by unit RN.   Medications reviewed and include thiamine.   Labs reviewed: K, Mg and Phos WDL. CBGS: 78-162 (inpatient orders for glycemic control are none).    Diet Order:   Diet Order             Diet full liquid Fluid consistency: Thin  Diet effective now                   EDUCATION NEEDS:   Education needs have been addressed  Skin:  Skin Assessment: Skin Integrity Issues: Skin Integrity Issues:: Incisions Incisions: closed abdomen s/p g-tube placement  Last BM:  05/17/23  Height:   Ht Readings from Last 1 Encounters:  05/27/23 5\' 1"  (1.549 m)    Weight:   Wt Readings from Last 1 Encounters:  05/29/23 53.6 kg    Ideal Body Weight:  47.7 kg  BMI:  Body mass index is 22.33 kg/m.  Estimated Nutritional Needs:   Kcal:  1600-1800  Protein:  75-95 grams  Fluid:  > 1.6 L    Levada Schilling, RD, LDN, CDCES Registered Dietitian III Certified Diabetes Care and Education Specialist If  unable to reach this RD, please use "RD Inpatient" group chat on secure chat between hours of 8am-4 pm daily

## 2023-05-29 NOTE — Care Management Important Message (Signed)
Important Message  Patient Details  Name: Denise Watts MRN: [id number] Date of Birth: [birth year]   Important Message Given:  Yes - Medicare IM     Cristela Blue, CMA 05/29/2023, 10:28 AM

## 2023-05-30 ENCOUNTER — Ambulatory Visit: Payer: Medicare HMO

## 2023-05-30 ENCOUNTER — Inpatient Hospital Stay: Payer: Medicare HMO

## 2023-05-30 DIAGNOSIS — R1310 Dysphagia, unspecified: Secondary | ICD-10-CM | POA: Diagnosis not present

## 2023-05-30 LAB — GLUCOSE, CAPILLARY
Glucose-Capillary: 112 mg/dL — ABNORMAL HIGH (ref 70–99)
Glucose-Capillary: 112 mg/dL — ABNORMAL HIGH (ref 70–99)
Glucose-Capillary: 116 mg/dL — ABNORMAL HIGH (ref 70–99)
Glucose-Capillary: 131 mg/dL — ABNORMAL HIGH (ref 70–99)

## 2023-05-30 MED ORDER — METOPROLOL SUCCINATE ER 25 MG PO TB24
12.5000 mg | ORAL_TABLET | Freq: Every day | ORAL | Status: DC
  Administered 2023-05-30 – 2023-06-05 (×7): 12.5 mg via ORAL
  Filled 2023-05-30 (×7): qty 1

## 2023-05-30 MED ORDER — SIMETHICONE 80 MG PO CHEW
80.0000 mg | CHEWABLE_TABLET | Freq: Four times a day (QID) | ORAL | Status: DC
  Administered 2023-05-30 – 2023-06-05 (×23): 80 mg
  Filled 2023-05-30 (×25): qty 1

## 2023-05-30 NOTE — Progress Notes (Signed)
PT Cancellation Note  Patient Details Name: SIMYA TERCERO MRN: [id number] DOB: 05-22-1955   Cancelled Treatment:    Reason Eval/Treat Not Completed:  (Consult received and chart reviewed.  Patient just received meal tray and wishes to eat prior to mobility efforts (declined OOB to chair for meal).  Will continue efforts at later time/date as medically appropriate)   Randa Riss H. Manson Passey, PT, DPT, NCS 05/30/23, 1:30 PM [phone]

## 2023-05-30 NOTE — Plan of Care (Signed)
  Problem: Education: Goal: Knowledge of General Education information will improve Description: Including pain rating scale, medication(s)/side effects and non-pharmacologic comfort measures Outcome: Progressing   Problem: Clinical Measurements: Goal: Will remain free from infection Outcome: Progressing Goal: Respiratory complications will improve Outcome: Progressing   Problem: Activity: Goal: Risk for activity intolerance will decrease Outcome: Progressing   Problem: Nutrition: Goal: Adequate nutrition will be maintained Outcome: Progressing   Problem: Pain Managment: Goal: General experience of comfort will improve and/or be controlled Outcome: Progressing   Problem: Safety: Goal: Ability to remain free from injury will improve Outcome: Progressing

## 2023-05-30 NOTE — Evaluation (Signed)
Occupational Therapy Evaluation Patient Details Name: Denise Watts MRN: [id number] DOB: 1955/08/07 Today's Date: 05/30/2023   History of Present Illness Denise Watts is a 68 y.o. female with medical history significant of  multiple cancer including vocal cord squamous cell carcinoma, esophageal stricture, breast cancer, adrenal carcinoma, non-small cell lung cancer (currently is on chemotherapy for lung cancer), HLD, COPD, CAD, dCHF, depression with anxiety, A-fib,. PE and DVT on Lovenox, mycoplasma pneumonia, who presents with dysphagia. Pt has hx of vocal cord squamous cell carcinoma and esophageal stricture.  States that her difficulty swallowing has been progressively worsening recently. She feels okay for swallowing liquids and water, but not solid food. Pt s/p G tube placement as of 1/23   Clinical Impression   [name] was seen for OT evaluation this date. Prior to hospital admission, pt was generally independent with ADL management. Pt lives in a 1 level home with her spouse who she reports is not able to provide much assistance. Pt presents to acute OT demonstrating impaired ADL performance and functional mobility 2/2 generalized weakness, increased pain with mobility, and decreased activity tolerance (See OT problem list for additional functional deficits). Pt currently requires MOD A for LB ADL management from STS, MIN A for functional mobility and toileting.  Pt would benefit from skilled OT services to address noted impairments and functional limitations (see below for any additional details) in order to maximize safety and independence while minimizing falls risk and caregiver burden. Anticipate the need for follow up OT services upon acute hospital DC.      If plan is discharge home, recommend the following: A lot of help with bathing/dressing/bathroom;A little help with bathing/dressing/bathroom;Assist for transportation;Help with stairs or ramp for entrance;Assistance with  cooking/housework    Functional Status Assessment  Patient has had a recent decline in their functional status and demonstrates the ability to make significant improvements in function in a reasonable and predictable amount of time.  Equipment Recommendations  None recommended by OT (Pt has necessary equipment)    Recommendations for Other Services       Precautions / Restrictions Precautions Precautions: Fall Precaution Comments: G tube Restrictions Weight Bearing Restrictions Per Provider Order: No      Mobility Bed Mobility Overal bed mobility: Needs Assistance Bed Mobility: Supine to Sit, Sit to Supine     Supine to sit: Supervision, HOB elevated Sit to supine: Min assist   General bed mobility comments: MIN A to bring BLE over EOB during sit>sup t/f.    Transfers Overall transfer level: Needs assistance Equipment used: Rolling walker (2 wheels), 1 person hand held assist Transfers: Sit to/from Stand, Bed to chair/wheelchair/BSC Sit to Stand: Contact guard assist     Step pivot transfers: Min assist            Balance Overall balance assessment: Needs assistance Sitting-balance support: Feet supported, Single extremity supported Sitting balance-Leahy Scale: Fair     Standing balance support: Reliant on assistive device for balance, During functional activity, Single extremity supported Standing balance-Leahy Scale: Fair                             ADL either performed or assessed with clinical judgement   ADL Overall ADL's : Needs assistance/impaired  General ADL Comments: MOD A to pull underwear up over hips after use of BSC. Pt reports incontinence at baseline. +1 HHA to step pivot to recliner with SBA for balance. Pt is able to walk a short distance to EOB with +1 HHA and min a during directional changes.     Vision Patient Visual Report: No change from baseline       Perception          Praxis         Pertinent Vitals/Pain Pain Assessment Pain Assessment: Faces Faces Pain Scale: Hurts whole lot Pain Location: abdominal pain with movement. Pain Descriptors / Indicators: Guarding, Grimacing, Sore Pain Intervention(s): Limited activity within patient's tolerance, Monitored during session, Repositioned     Extremity/Trunk Assessment Upper Extremity Assessment Upper Extremity Assessment: Generalized weakness   Lower Extremity Assessment Lower Extremity Assessment: Generalized weakness       Communication Communication Communication: No apparent difficulties Cueing Techniques: Verbal cues   Cognition Arousal: Alert Behavior During Therapy: WFL for tasks assessed/performed Overall Cognitive Status: Within Functional Limits for tasks assessed                                 General Comments: Pleasant, conversational, eager to participate in session.     General Comments       Exercises Other Exercises Other Exercises: Pt educated on role of OT in acute setting, safety, falls prevention strategies, and DC recs this date.   Shoulder Instructions      Home Living Family/patient expects to be discharged to:: Private residence Living Arrangements: Spouse/significant other Available Help at Discharge: Family (Per pt report, spouse is not much assistance and has been sick recently.) Type of Home: House Home Access: Stairs to enter [employer] of Steps: 5 at front door, 3 at back door Entrance Stairs-Rails: Can reach both Home Layout: One level     Bathroom Shower/Tub: Chief Strategy Officer: Standard     Home Equipment: Shower seat;Grab bars - tub/shower;Cane - single Librarian, academic (2 wheels);Rollator (4 wheels)          Prior Functioning/Environment Prior Level of Function : Independent/Modified Independent;History of Falls (last six months)             Mobility Comments: Stand pivots to  [facility] consistently without a device. was walking with a SPC for longer distances but states it was last in the [facility] ED. ADLs Comments: Generally independent, but has felt more limited over last few weeks.        OT Problem List: Decreased strength;Decreased activity tolerance;Decreased safety awareness;Impaired balance (sitting and/or standing);Pain;Decreased knowledge of use of DME or AE      OT Treatment/Interventions: Self-care/ADL training;Therapeutic exercise;Therapeutic activities;DME and/or AE instruction;Patient/family education;Balance training;Energy conservation    OT Goals(Current goals can be found in the care plan section) Acute Rehab OT Goals Patient Stated Goal: To feel better OT Goal Formulation: With patient Time For Goal Achievement: 06/13/23 Potential to Achieve Goals: Good ADL Goals Pt Will Perform Grooming: with modified independence;sitting;standing Pt Will Perform Lower Body Dressing: sit to/from stand;with set-up;with supervision;with adaptive equipment Pt Will Transfer to Toilet: bedside commode;ambulating;with set-up;with supervision Pt Will Perform Toileting - Clothing Manipulation and hygiene: sit to/from stand;with supervision;with set-up  OT Frequency: Min 1X/week    Co-evaluation              AM-PAC OT "6 Clicks" Daily Activity  Outcome Measure Help from another person eating meals?: A Little Help from another person taking care of personal grooming?: A Little Help from another person toileting, which includes using toliet, bedpan, or urinal?: A Little Help from another person bathing (including washing, rinsing, drying)?: A Lot Help from another person to put on and taking off regular upper body clothing?: A Little Help from another person to put on and taking off regular lower body clothing?: A Lot 6 Click Score: 16   End of Session Equipment Utilized During Treatment: Gait belt Nurse Communication: Mobility status  Activity Tolerance:  Patient tolerated treatment well Patient left: in bed;with call bell/phone within reach;with bed alarm set  OT Visit Diagnosis: Other abnormalities of gait and mobility (R26.89);Muscle weakness (generalized) (M62.81);Pain Pain - Right/Left:  (both) Pain - part of body:  (abdominal)                Time: 1440-1505 OT Time Calculation (min): 25 min Charges:  OT General Charges $OT Visit: 1 Visit OT Evaluation $OT Eval Moderate Complexity: 1 Mod OT Treatments $Self Care/Home Management : 8-22 mins  Rockney Ghee, M.S., OTR/L 05/30/23, 3:35 PM

## 2023-05-30 NOTE — Progress Notes (Signed)
PROGRESS NOTE    Denise Watts   EXH:[id number] DOB: March 03, 1956  DOA: 05/24/2023 Date of Service: 05/30/23 which is hospital day 6  PCP: Sherlyn Hay, [facility] course / significant events:   HPI:  Denise Watts is a 68 y.o. female with medical history significant of  multiple cancer including vocal cord squamous cell carcinoma, esophageal stricture, breast cancer, adrenal carcinoma, non-small cell lung cancer (currently is on chemotherapy for lung cancer), HLD, COPD, CAD, dCHF, depression with anxiety, A-fib,. PE and DVT on Lovenox, mycoplasma pneumonia, who presents with dysphagia. Pt has hx of vocal cord squamous cell carcinoma and esophageal stricture.  States that her difficulty swallowing has been progressively worsening recently. She feels okay for swallowing liquids and water, but not solid food. She is direct admit w/ plan for feeding tube placement.   01/22: admitted to hospitalist service w/ general surgery consult 01/23: placement G-tube, plan for tube feeds tomorrow, heparin gtt tomorrow (recent hx PE) 01/24: ok to use G-tube, continue heparin today and start lovenox tomorrow. If tolerating tube feeds expect discharge tomorrow AM  01/25: significant abd pain / distension this morning. KUB/CT notable for intraluminal gas, some peritoneal air c/w surgery, no obvious obstruction. Concern for ileus. Gtube to gravity. General surgery following. Question aspiration but CXR appears normal except known nodules, abn lung sounds seem related to bronchial, no desaturation, suspect possible aspiration pneumonitis  01/26: Per surgery, ok to restart tube feeds slowly, reengaged w/ RD to assist. Pt still uncomfortable and hasn't had BM, trial suppository/enema, will see if tolerates tube feeds before giving enteral laxatives. Pt encouraged to get OOB to chair / walk if able but she states she is probably not able d/t pain. Will repeat KUB. Will also repeat CXR, concern possible aspiration  given coughing, no wheezing, lungs sound better today overall  01/27: finally had BM today, increasing tube feeds later today/tomorrow and work on ambulating.  Patient reports feeling significantly better after BM 01/28: We are hopeful for DC today however recurrent severe abdominal pain, KUB repeat, appears similar to 01/26.  Repeat suppository, if/when BM, continue bowel regimen and patient really needs to ambulate     Consultants:  General surgery   Procedures/Surgeries: 05/25/23 G tube placed - Dr Everlene Farrier      ASSESSMENT & PLAN:  Dysphagia due to esophageal stricture s/p radiation for vocal cord squamous cell carcinoma: POD3 S/p G tube placement Cautious restart GTube feeds   Abdominal pain / distension - improved Ileus - resolved  Constipation, abdominal bloating No apparent complication from G-Tube placement  restart GTube feeds  General surgery following BM 05/29/23 --> continue bowel regimen via Gtube  Recurrent severe abdominal pain today --> KUB repeat, appears similar to 01/26 -->  Repeat suppository, if/when BM, continue bowel regimen and patient really needs to ambulate   Hx of DVT (deep venous thrombosis) and hx of pulmonary embolus Restart lovenox    PAF (paroxysmal atrial fibrillation) Metoprolol held d/t soft BP but rate is back up, BP ok  resume low dose beta blocker metoprolol XR 12.5 mg daily Restart home lovenox    COPD (chronic obstructive pulmonary disease): Stable bronchodilators and as needed Mucinex   Chronic diastolic CHF (congestive heart failure)  Clinically does not seem to have CHF exacerbation. watch volume status closely   HLD (hyperlipidemia) Crestor   CAD (coronary artery disease) Crestor   Non-small cell lung cancer  On chemotherapy, last dose was 2 weeks ago. Follow-up with  Dr. Rana Snare   Depression with anxiety Current new home [place]        DVT prophylaxis: heparin  IV fluids: no continuous IV fluids  Nutrition:  tube feeds to start today  Central lines / invasive devices: G tube, port  Code Status: FULL CODE ACP documentation reviewed:  none on file in [facility]  TOC needs: TBD Barriers to dispo / significant pending items: start tube feeds, if ambulating okay and abdominal pain improved may dc tomorrow              Subjective / Brief ROS:  Patient reports abdominal pain, nausea is about the same today  Few hours after I saw patient, RN reports pt had BM and feeling improved  Denies CP/SOB.  Denies new weakness.  Family Communication: support person at bedside on rounds     Objective Findings:  Vitals:   05/30/23 0304 05/30/23 0448 05/30/23 0814 05/30/23 1157  BP:  109/64 (!) 104/58 107/62  Pulse:  (!) 105 97 97  Resp:   16   Temp:  97.7 F (36.5 C) 97.9 F (36.6 C) (!) 97.4 F (36.3 C)  TempSrc:  Oral Oral   SpO2:  96% 95% 96%  Weight: 54.1 kg     Height:        Intake/Output Summary (Last 24 hours) at 05/30/2023 1612 Last data filed at 05/30/2023 1551 Gross per 24 hour  Intake 200 ml  Output 1200 ml  Net -1000 ml   Filed Weights   05/28/23 0500 05/29/23 0500 05/30/23 0304  Weight: 54.9 kg 53.6 kg 54.1 kg    Examination:  Physical Exam Constitutional:      General: She is not in acute distress.    Appearance: She is not ill-appearing.  Cardiovascular:     Rate and Rhythm: Normal rate and regular rhythm.  Pulmonary:     Effort: Pulmonary effort is normal. No respiratory distress.     Comments: Rhonchorous / bronchial breath sounds but improved from yesterday Abdominal:     General: Bowel sounds are normal. There is distension.     Tenderness: There is abdominal tenderness. There is no rebound.     Comments: Appears more distended compared to yesterday but not as severe as 01/25-01/26  Musculoskeletal:     Right lower leg: No edema.     Left lower leg: No edema.  Skin:    General: Skin is warm and dry.  Neurological:     Mental Status: She is alert.  Mental status is at baseline.  Psychiatric:        Mood and Affect: Mood normal.          Scheduled Medications:   benzonatate  200 mg Oral TID   Chlorhexidine Gluconate Cloth  6 each Topical Q0600   docusate  100 mg Per Tube BID   enoxaparin (LOVENOX) injection  50 mg Subcutaneous Q12H   feeding supplement (OSMOLITE 1.5 CAL)  237 mL Per Tube 5 X Daily   fentaNYL  1 patch Transdermal Q72H   free water  60 mL Per Tube 5 X Daily   metoprolol succinate  12.5 mg Oral Daily   multivitamin  15 mL Oral Daily   omeprazole  40 mg Oral Daily   rOPINIRole  0.25 mg Oral QHS   rosuvastatin  40 mg Oral Daily   simethicone  80 mg Per Tube QID   sodium chloride flush  10-40 mL Intracatheter Q12H   thiamine  100 mg Oral Daily  Continuous Infusions:     PRN Medications:  albuterol, bisacodyl, dextromethorphan-guaiFENesin, hydrALAZINE, morphine injection, ondansetron (ZOFRAN) IV, sodium phosphate  Antimicrobials from admission:  Anti-infectives (From admission, onward)    Start     Dose/Rate Route Frequency Ordered Stop   05/25/23 2200  cefoTEtan (CEFOTAN) 2 g in sodium chloride 0.9 % 100 mL IVPB        2 g 200 mL/hr over 30 Minutes Intravenous Every 12 hours 05/25/23 1923 05/26/23 1910   05/25/23 1200  cefoTEtan (CEFOTAN) 2 g in sodium chloride 0.9 % 100 mL IVPB        2 g 200 mL/hr over 30 Minutes Intravenous  Once 05/24/23 1850 05/25/23 1905           Data Reviewed:  I have personally reviewed the following...  CBC: Recent Labs  Lab 05/26/23 0533 05/26/23 1619 05/27/23 0525 05/28/23 0804 05/29/23 0634  WBC 5.4 6.7 7.7 5.3 3.8*  HGB 9.3* 9.6* 10.1* 9.9* 10.5*  HCT 29.8* 30.3* 31.7* 31.8* 33.4*  MCV 104.6* 102.4* 103.9* 104.6* 104.0*  PLT 162 199 202 189 189   Basic Metabolic Panel: Recent Labs  Lab 05/24/23 1903 05/25/23 0645 05/26/23 0533 05/26/23 1120 05/26/23 1619 05/27/23 0525 05/27/23 1857 05/28/23 0804 05/29/23 0634  NA 138 139 139  --   --    --   --  139 140  K 4.0 3.3* 4.0  --   --   --   --  3.9 3.7  CL 98 103 102  --   --   --   --  105 106  CO2 27 28 25   --   --   --   --  29 28  GLUCOSE 100* 94 141*  --   --   --   --  85 124*  BUN 8 <5* <5*  --   --   --   --  6* 7*  CREATININE 0.69 0.63 0.72  --   --   --   --  0.45 0.40*  CALCIUM 9.0 8.4* 8.6*  --   --   --   --  8.2* 8.1*  MG 2.2  --   --  1.9 1.9 2.2 2.0  --   --   PHOS 3.3  --   --  3.1 2.6 2.3* 2.3*  --   --    GFR: Estimated Creatinine Clearance: 50.1 mL/min (A) (by C-G formula based on SCr of 0.4 mg/dL (L)). Liver Function Tests: Recent Labs  Lab 05/26/23 0533  AST 23  ALT 10  ALKPHOS 41  BILITOT 0.5  PROT 5.0*  ALBUMIN 3.0*   No results for input(s): "LIPASE", "AMYLASE" in the last 168 hours. No results for input(s): "AMMONIA" in the last 168 hours. Coagulation Profile: Recent Labs  Lab 05/24/23 1903  INR 1.1   Cardiac Enzymes: No results for input(s): "CKTOTAL", "CKMB", "CKMBINDEX", "TROPONINI" in the last 168 hours. BNP (last 3 results) No results for input(s): "PROBNP" in the last 8760 hours. HbA1C: No results for input(s): "HGBA1C" in the last 72 hours. CBG: Recent Labs  Lab 05/29/23 1618 05/29/23 2026 05/30/23 0058 05/30/23 0816 05/30/23 1142  GLUCAP 117* 135* 131* 116* 112*   Lipid Profile: No results for input(s): "CHOL", "HDL", "LDLCALC", "TRIG", "CHOLHDL", "LDLDIRECT" in the last 72 hours. Thyroid Function Tests: No results for input(s): "TSH", "T4TOTAL", "FREET4", "T3FREE", "THYROIDAB" in the last 72 hours. Anemia Panel: No results for input(s): "VITAMINB12", "FOLATE", "FERRITIN", "TIBC", "IRON", "RETICCTPCT" in  the last 72 hours. Most Recent Urinalysis On File:     Component Value Date/Time   COLORURINE YELLOW (A) 05/10/2023 1629   APPEARANCEUR CLEAR (A) 05/10/2023 1629   APPEARANCEUR Clear 02/27/2023 0959   LABSPEC 1.013 05/10/2023 1629   PHURINE 5.0 05/10/2023 1629   GLUCOSEU 50 (A) 05/10/2023 1629   HGBUR  NEGATIVE 05/10/2023 1629   BILIRUBINUR NEGATIVE 05/10/2023 1629   BILIRUBINUR Negative 02/27/2023 0959   KETONESUR 5 (A) 05/10/2023 1629   PROTEINUR NEGATIVE 05/10/2023 1629   UROBILINOGEN 0.2 11/05/2021 1736   NITRITE NEGATIVE 05/10/2023 1629   LEUKOCYTESUR NEGATIVE 05/10/2023 1629   Sepsis Labs: @LABRCNTIP (procalcitonin:4,lacticidven:4) Microbiology: No results found for this or any previous visit (from the past 240 hours).    Radiology Studies last 3 days: DG Chest Port 1 View Result Date: 05/28/2023 CLINICAL DATA:  Dysphagia, cough. EXAM: PORTABLE CHEST 1 VIEW COMPARISON:  Radiograph yesterday.  CT 05/10/2023 FINDINGS: Accessed right chest port in the SVC. Normal heart size with stable mediastinal contours. Scattered nodular densities are without significant interval change. No confluent airspace disease. No significant pleural effusion. No pneumothorax. IMPRESSION: 1. No acute chest findings. 2. Scattered nodular densities are without significant interval change. Electronically Signed   By: Narda Rutherford M.D.   On: 05/28/2023 18:57   DG Abd 1 View Result Date: 05/28/2023 CLINICAL DATA:  Dysphagia. EXAM: ABDOMEN - 1 VIEW COMPARISON:  CT yesterday FINDINGS: Gastrostomy tube in the upper abdomen. Scattered air throughout nondilated small bowel centrally, no evidence of obstruction. No significant formed stool in the colon. No radiopaque calculi. IMPRESSION: Nonobstructive bowel gas pattern. Electronically Signed   By: Narda Rutherford M.D.   On: 05/28/2023 18:55   CT ABDOMEN PELVIS WO CONTRAST Result Date: 05/27/2023 CLINICAL DATA:  Abdominal pain. Recent gastrostomy tube placement. Lung carcinoma. * Tracking Code: BO * EXAM: CT ABDOMEN AND PELVIS WITHOUT CONTRAST TECHNIQUE: Multidetector CT imaging of the abdomen and pelvis was performed following the standard protocol without IV contrast. RADIATION DOSE REDUCTION: This exam was performed according to the departmental dose-optimization  program which includes automated exposure control, adjustment of the mA and/or kV according to patient size and/or use of iterative reconstruction technique. COMPARISON:  12/09/2021 FINDINGS: Lower chest: Tiny bilateral pleural effusions. Hepatobiliary: No mass visualized on this unenhanced exam. Gallbladder is unremarkable. No evidence of biliary ductal dilatation. Pancreas: No mass or inflammatory process visualized on this unenhanced exam. Spleen:  Within normal limits in size. Adrenals/Urinary tract: No evidence of urolithiasis or hydronephrosis. Unremarkable unopacified urinary bladder. Stomach/Bowel: Gastrostomy tube is seen appropriate position in the gastric body. Tiny amount of free intraperitoneal air as well as gas within the anterior abdominal wall adjacent to the gastrostomy is attributable to recent gastrostomy tube placement. No evidence of bowel obstruction, inflammatory process, or abnormal fluid collections. Diverticulosis is seen mainly involving the sigmoid colon, however there is no evidence of diverticulitis. Vascular/Lymphatic: No pathologically enlarged lymph nodes identified. No evidence of abdominal aortic aneurysm. Reproductive:  No mass or other significant abnormality. Other:  None. Musculoskeletal:  No suspicious bone lesions identified. IMPRESSION: Gastrostomy tube in appropriate position. Tiny amount of free intraperitoneal air and gas within the anterior abdominal wall, attributable to recent gastrostomy tube placement. No other acute findings. Colonic diverticulosis, without radiographic evidence of diverticulitis. Electronically Signed   By: Danae Orleans M.D.   On: 05/27/2023 13:19   DG Abd 1 View Result Date: 05/27/2023 CLINICAL DATA:  Abdominal pain and distension. EXAM: ABDOMEN - 1 VIEW COMPARISON:  01/25/2023  FINDINGS: Prominent gaseous distention of the stomach. Catheter overlies the left abdomen, potentially a gastrostomy tube. Gas dilated bowel loop overlying the right  upper abdomen may reflect distended transverse colon. Diffuse gaseous small bowel distension noted. Gas and stool noted in the sigmoid colon with prominent gas in stool visible in the rectum. IMPRESSION: Prominent gaseous distention of the stomach with diffuse gaseous small bowel distension. Gas dilated bowel loop overlying the right upper abdomen may reflect distended transverse colon. Findings likely reflect ileus. CT imaging could be used to more definitively evaluate. Electronically Signed   By: Kennith Center M.D.   On: 05/27/2023 11:10   DG Chest Port 1 View Result Date: 05/27/2023 CLINICAL DATA:  Shortness of breath.  Abnormal lung sounds. EXAM: PORTABLE CHEST 1 VIEW COMPARISON:  05/10/2023 FINDINGS: Scattered bilateral pulmonary nodules again noted. No focal consolidation or pulmonary edema. No substantial pleural effusion. The cardiopericardial silhouette is within normal limits for size. Right Port-A-Cath remains in place. No acute bony abnormality. IMPRESSION: Scattered bilateral pulmonary nodules again noted. No acute cardiopulmonary findings. Electronically Signed   By: Kennith Center M.D.   On: 05/27/2023 11:06         Sunnie Nielsen, DO [facility] 05/30/2023, 4:12 PM    Dictation software may have been used to generate the above note. Typos may occur and escape review in typed/dictated notes. Please contact Dr Lyn Hollingshead directly for clarity if needed.  Staff may message me via secure chat in Epic  but this may not receive an immediate response,  please page me for urgent matters!  If 7PM-7AM, please contact night coverage [email or web address]

## 2023-05-31 ENCOUNTER — Ambulatory Visit: Payer: Medicare HMO

## 2023-05-31 DIAGNOSIS — K567 Ileus, unspecified: Secondary | ICD-10-CM

## 2023-05-31 DIAGNOSIS — R1310 Dysphagia, unspecified: Secondary | ICD-10-CM | POA: Diagnosis not present

## 2023-05-31 LAB — GLUCOSE, CAPILLARY
Glucose-Capillary: 101 mg/dL — ABNORMAL HIGH (ref 70–99)
Glucose-Capillary: 102 mg/dL — ABNORMAL HIGH (ref 70–99)
Glucose-Capillary: 107 mg/dL — ABNORMAL HIGH (ref 70–99)
Glucose-Capillary: 115 mg/dL — ABNORMAL HIGH (ref 70–99)
Glucose-Capillary: 121 mg/dL — ABNORMAL HIGH (ref 70–99)
Glucose-Capillary: 98 mg/dL (ref 70–99)

## 2023-05-31 LAB — CBC
HCT: 31.3 % — ABNORMAL LOW (ref 36.0–46.0)
Hemoglobin: 10 g/dL — ABNORMAL LOW (ref 12.0–15.0)
MCH: 33.1 pg (ref 26.0–34.0)
MCHC: 31.9 g/dL (ref 30.0–36.0)
MCV: 103.6 fL — ABNORMAL HIGH (ref 80.0–100.0)
Platelets: 197 10*3/uL (ref 150–400)
RBC: 3.02 MIL/uL — ABNORMAL LOW (ref 3.87–5.11)
RDW: 15.1 % (ref 11.5–15.5)
WBC: 3.9 10*3/uL — ABNORMAL LOW (ref 4.0–10.5)
nRBC: 0 % (ref 0.0–0.2)

## 2023-05-31 LAB — BASIC METABOLIC PANEL
Anion gap: 10 (ref 5–15)
BUN: 10 mg/dL (ref 8–23)
CO2: 28 mmol/L (ref 22–32)
Calcium: 8.4 mg/dL — ABNORMAL LOW (ref 8.9–10.3)
Chloride: 102 mmol/L (ref 98–111)
Creatinine, Ser: 0.43 mg/dL — ABNORMAL LOW (ref 0.44–1.00)
GFR, Estimated: >60 mL/min (ref >60)
Glucose, Bld: 103 mg/dL — ABNORMAL HIGH (ref 70–99)
Potassium: 4.3 mmol/L (ref 3.5–5.1)
Sodium: 140 mmol/L (ref 135–145)

## 2023-05-31 MED ORDER — OSMOLITE 1.5 CAL PO LIQD
120.0000 mL | Freq: Every day | ORAL | Status: DC
  Administered 2023-05-31 – 2023-06-02 (×9): 120 mL

## 2023-05-31 MED ORDER — LACTULOSE 10 GM/15ML PO SOLN
20.0000 g | Freq: Two times a day (BID) | ORAL | Status: DC
  Administered 2023-05-31 – 2023-06-05 (×8): 20 g
  Filled 2023-05-31 (×10): qty 30

## 2023-05-31 MED ORDER — BENZONATATE 100 MG PO CAPS
200.0000 mg | ORAL_CAPSULE | Freq: Three times a day (TID) | ORAL | Status: DC | PRN
  Administered 2023-06-01 – 2023-06-05 (×10): 200 mg via ORAL
  Filled 2023-05-31 (×10): qty 2

## 2023-05-31 MED ORDER — SENNA 8.6 MG PO TABS
1.0000 | ORAL_TABLET | Freq: Every day | ORAL | Status: DC
  Administered 2023-05-31 – 2023-06-05 (×6): 8.6 mg
  Filled 2023-05-31 (×6): qty 1

## 2023-05-31 MED ORDER — OXYCODONE HCL 5 MG PO TABS
5.0000 mg | ORAL_TABLET | ORAL | Status: DC | PRN
  Administered 2023-05-31 – 2023-06-03 (×13): 5 mg via ORAL
  Filled 2023-05-31 (×13): qty 1

## 2023-05-31 NOTE — Progress Notes (Signed)
Not  able to give bolus feeding, whenever connect syringe to peg tube, a lot reflux coming back through tube instead of going.  MD made aware. Plan of care ongoing.

## 2023-05-31 NOTE — Progress Notes (Signed)
PROGRESS NOTE  EMA HEBNER    DOB: [birth year], 68 y.o.  UXL:[id number]    Code Status: Full Code   DOA: 05/24/2023   LOS: 7   Brief hospital course  Denise Watts is a 68 y.o. female with a PMH significant for multiple cancer including vocal cord squamous cell carcinoma, esophageal stricture, breast cancer, adrenal carcinoma, non-small cell lung cancer (currently is on chemotherapy for lung cancer), HLD, COPD, CAD, dCHF, depression with anxiety, A-fib,. PE and DVT on Lovenox, mycoplasma pneumonia, who presents with progressively worsening dysphagia.  She is direct admit w/ plan for feeding tube placement 1/22  w/ general surgery consult 01/23: placement G-tube, plan for tube feeds tomorrow, heparin gtt tomorrow (recent hx PE) 01/24: ok to use G-tube, continue heparin today and start lovenox tomorrow. If tolerating tube feeds expect discharge tomorrow AM  01/25: significant abd pain / distension this morning. KUB/CT notable for intraluminal gas, some peritoneal air c/w surgery, no obvious obstruction. Concern for ileus. Gtube to gravity. General surgery following. Question aspiration but CXR appears normal except known nodules, abn lung sounds seem related to bronchial, no desaturation, suspect possible aspiration pneumonitis  01/26: Per surgery, ok to restart tube feeds slowly, reengaged w/ RD to assist. Pt still uncomfortable and hasn't had BM, trial suppository/enema, will see if tolerates tube feeds before giving enteral laxatives. Pt encouraged to get OOB to chair / walk if able but she states she is probably not able d/t pain. Will repeat KUB. Will also repeat CXR, concern possible aspiration given coughing, no wheezing, lungs sound better today overall  01/27: BM today, increasing tube feeds and work on ambulating.  Patient reports feeling significantly better after BM 01/28: We are hopeful for DC today however recurrent severe abdominal pain, KUB repeat, appears similar to 01/26.  Repeat  suppository, if/when BM, continue bowel regimen and patient really needs to ambulate  05/31/23 -continues to have abdominal pain. Tolerating small volumes PO. Mild reflux in peg tube. Re-evaluated by surgery who was able to flush G tube.   Assessment & Plan  Principal Problem:   Dysphagia Active Problems:   DVT (deep venous thrombosis) (HCC)   Hx of pulmonary embolus   PAF (paroxysmal atrial fibrillation) (HCC)   COPD (chronic obstructive pulmonary disease) (HCC)   Chronic diastolic CHF (congestive heart failure) (HCC)   HLD (hyperlipidemia)   CAD (coronary artery disease)   Non-small cell lung cancer (HCC)   Depression with anxiety   Severe protein-calorie malnutrition (HCC)   Malnutrition of moderate degree  Dysphagia due to esophageal stricture s/p radiation for vocal cord squamous cell carcinoma:  Ileus  constipation S/p G tube placement 1/23- recurrent imaging and exams show stability  Continue G-tube feeds. Smaller volumes if having discomfort/nausea. Likely has some level of ileus.  Continue bowel regimen General surgery following.  Pain management PRN but try to avoid opioids  Hx of DVT (deep venous thrombosis) and hx of pulmonary embolus Continue lovenox tx   PAF (paroxysmal atrial fibrillation) Metoprolol held d/t soft BP but rate is back up, BP ok  resume low dose beta blocker metoprolol XR 12.5 mg daily Restart home lovenox    COPD (chronic obstructive pulmonary disease): Stable bronchodilators and as needed Mucinex   Chronic diastolic CHF (congestive heart failure)  Clinically does not seem to have CHF exacerbation. watch volume status closely   HLD  CAD Crestor   Non-small cell lung cancer  On chemotherapy, last dose was 2 weeks ago. Follow-up with  Dr. Rana Snare   Depression with anxiety Current new home [facility]  Body mass index is 21.37 kg/m.  VTE ppx: on lovenox  Diet:     Diet   Diet full liquid Fluid consistency: Thin    Consultants: Surgery   Subjective 05/31/23    Pt reports having some abdominal bloating and tenderness after trying to eat breakfast and tube feed. Denies leaking from insertion site. Had large BM yesterday.   Objective   Vitals:   05/30/23 1705 05/30/23 1957 05/31/23 0412 05/31/23 0413  BP: 108/63 (!) 90/59  (!) 97/48  Pulse: 92 95  80  Resp: 16 16  18   Temp: 97.7 F (36.5 C) 98 F (36.7 C)  97.7 F (36.5 C)  TempSrc:      SpO2: 96% 100%  99%  Weight:   51.3 kg   Height:        Intake/Output Summary (Last 24 hours) at 05/31/2023 0850 Last data filed at 05/31/2023 0524 Gross per 24 hour  Intake 5806.83 ml  Output 600 ml  Net 5206.83 ml   Filed Weights   05/29/23 0500 05/30/23 0304 05/31/23 0412  Weight: 53.6 kg 54.1 kg 51.3 kg     Physical Exam:  General: awake, alert, NAD. Frail-appearing  HEENT: atraumatic, clear conjunctiva, anicteric sclera, MMM, hearing grossly normal Respiratory: normal respiratory effort. Cardiovascular: quick capillary refill Gastrointestinal: soft, mildly distended. Tender to light touch in epigastric area. G-tube in place without leakage or infection.  Nervous: A&O x3. no gross focal neurologic deficits, normal speech Extremities: moves all equally, no edema, normal tone Skin: dry, intact, normal temperature, normal color. No rashes, lesions or ulcers on exposed skin Psychiatry: normal mood, congruent affect  Labs   I have personally reviewed the following labs and imaging studies CBC    Component Value Date/Time   WBC 3.9 (L) 05/31/2023 0456   RBC 3.02 (L) 05/31/2023 0456   HGB 10.0 (L) 05/31/2023 0456   HGB 10.2 (L) 05/22/2023 1140   HGB 14.0 11/03/2021 1551   HCT 31.3 (L) 05/31/2023 0456   HCT 42.1 11/03/2021 1551   PLT 197 05/31/2023 0456   PLT 255 05/22/2023 1140   PLT 216 11/03/2021 1551   MCV 103.6 (H) 05/31/2023 0456   MCV 97 11/03/2021 1551   MCV 100 04/08/2014 0409   MCH 33.1 05/31/2023 0456   MCHC 31.9  05/31/2023 0456   RDW 15.1 05/31/2023 0456   RDW 12.9 11/03/2021 1551   RDW 12.7 04/08/2014 0409   LYMPHSABS 0.6 (L) 05/22/2023 1140   LYMPHSABS 1.2 11/03/2021 1551   LYMPHSABS 0.8 (L) 04/08/2014 0409   MONOABS 0.6 05/22/2023 1140   MONOABS 0.7 04/08/2014 0409   EOSABS 0.1 05/22/2023 1140   EOSABS 0.2 11/03/2021 1551   EOSABS 0.0 04/08/2014 0409   BASOSABS 0.0 05/22/2023 1140   BASOSABS 0.0 11/03/2021 1551   BASOSABS 0.0 04/08/2014 0409      Latest Ref Rng & Units 05/31/2023    4:56 AM 05/29/2023    6:34 AM 05/28/2023    8:04 AM  BMP  Glucose 70 - 99 mg/dL 308  657  85   BUN 8 - 23 mg/dL 10  7  6    Creatinine 0.44 - 1.00 mg/dL 8.46  9.62  9.52   Sodium 135 - 145 mmol/L 140  140  139   Potassium 3.5 - 5.1 mmol/L 4.3  3.7  3.9   Chloride 98 - 111 mmol/L 102  106  105   CO2  22 - 32 mmol/L 28  28  29    Calcium 8.9 - 10.3 mg/dL 8.4  8.1  8.2     DG Abd Portable 1V Result Date: 05/30/2023 CLINICAL DATA:  Abdominal pain EXAM: PORTABLE ABDOMEN - 1 VIEW COMPARISON:  05/28/2023 FINDINGS: PEG tube in LEFT upper quadrant. No dilated large or small bowel. Gas and stool in the rectum. IMPRESSION: No bowel obstruction. Electronically Signed   By: Genevive Bi M.D.   On: 05/30/2023 16:11    Disposition Plan & Communication  Patient status: Inpatient  Admitted From: Home Planned disposition location: Skilled nursing facility Anticipated discharge date: 1/31 pending stabilization of feeding  Family Communication: none at bedside    Author: Leeroy Bock, DO [facility] 05/31/2023, 8:50 AM   Available by Epic secure chat 7AM-7PM. If 7PM-7AM, please contact night-coverage.  [facility] contact information found on [email or web address].

## 2023-05-31 NOTE — Progress Notes (Signed)
[facility] SURGICAL PROGRESS NOTE  Hospital Day(s): 7.   Post op day(s): 6 Days Post-Op.   Interval History:  Asked to see patient again secondary to concerns with being unable to run bolus feeds via G-tube Patient seen and examined Patient reports continued abdominal discomfort She is leukopenic to 3.9; this is stable Hgb to 10.0; stable Renal function; sCr  -0.43; UO - 600 ccs + unmeasured No electrolyte derangements Has had multiple KUB without evidence of obstruction CT on 01/25 showed G-tube in good position    Vital signs in last 24 hours: [min-max] current  Temp:  [97.4 F (36.3 C)-98 F (36.7 C)] 98 F (36.7 C) (01/29 0929) Pulse Rate:  [80-97] 80 (01/29 0929) Resp:  [16-18] 18 (01/29 0929) BP: (90-108)/(48-63) 108/62 (01/29 0929) SpO2:  [96 %-100 %] 99 % (01/29 0929) Weight:  [51.3 kg] 51.3 kg (01/29 0412)     Height: 5\' 1"  (154.9 cm) Weight: 51.3 kg BMI (Calculated): 21.38   Intake/Output last 2 shifts:  01/28 0701 - 01/29 0700 In: 5806.8 [P.O.:200; NG/GT:5606.8] Out: 600 [Urine:600]   Physical Exam:  Constitutional: alert, cooperative and no distress  Respiratory: breathing non-labored at rest  Gastrointestinal: soft, incisional soreness, and non-distended. G-tube in LUQ; I was able to flush this without resistance. There was some reflux of normal appearing gastric fluid.  Integumentary: Mini-laparotomy is CDI with dermabond, no erythema  Labs:     Latest Ref Rng & Units 05/31/2023    4:56 AM 05/29/2023    6:34 AM 05/28/2023    8:04 AM  CBC  WBC 4.0 - 10.5 K/uL 3.9  3.8  5.3   Hemoglobin 12.0 - 15.0 g/dL 16.1  09.6  9.9   Hematocrit 36.0 - 46.0 % 31.3  33.4  31.8   Platelets 150 - 400 K/uL 197  189  189       Latest Ref Rng & Units 05/31/2023    4:56 AM 05/29/2023    6:34 AM 05/28/2023    8:04 AM  CMP  Glucose 70 - 99 mg/dL 045  409  85   BUN 8 - 23 mg/dL 10  7  6    Creatinine 0.44 - 1.00 mg/dL 8.11  9.14  7.82   Sodium 135 - 145  mmol/L 140  140  139   Potassium 3.5 - 5.1 mmol/L 4.3  3.7  3.9   Chloride 98 - 111 mmol/L 102  106  105   CO2 22 - 32 mmol/L 28  28  29    Calcium 8.9 - 10.3 mg/dL 8.4  8.1  8.2     Imaging studies: No new pertinent imaging studies   Assessment/Plan: 68 y.o. female 6 Days Post-Op s/p gastrostomy tube placement for malnutrition, complicated by pertinent comorbidities including vocal cord squamous cell carcinoma, breast cancer, adrenal carcinoma, non-small cell lung cancer (currently is on chemotherapy for lung cancer), HLD, COPD, CAD, dCHF, depression with anxiety, A-fib and DVT/PE, mycoplasma pneumonia.    - I was able to flush G-tube without resistance nor leaking. There was some reflux of gastric content but this was mild. Likely need to push bolus feeds/water or medicine slowly and over time. Stomach may be getting full quickly.   - No evidence of obstruction on previous imaging   - I do not think she needs any additional imaging. She has had multiple KUBs and CT on 01/25 that shows adequate positioning of tube  - Can utilize G-tube to gravity if develops significant nausea  -  Change dressing as needed    - Further management per primary service    - We will remain available as needed; Please call with questions/concerns   All of the above findings and recommendations were discussed with the patient, and the medical team, and all of patient's questions were answered to her expressed satisfaction.  -- Lynden Oxford, PA-C [facility] 05/31/2023, 11:39 AM M-F: 7am - 4pm

## 2023-05-31 NOTE — Evaluation (Signed)
Physical Therapy Evaluation Patient Details Name: Denise Watts MRN: [id number] DOB: April 22, 1956 Today's Date: 05/31/2023  History of Present Illness  Denise Watts is a 68 y.o. female with medical history significant of  multiple cancer including vocal cord squamous cell carcinoma, esophageal stricture, breast cancer, adrenal carcinoma, non-small cell lung cancer (currently is on chemotherapy for lung cancer), HLD, COPD, CAD, dCHF, depression with anxiety, A-fib,. PE and DVT on Lovenox, mycoplasma pneumonia, who presents with dysphagia. Pt has hx of vocal cord squamous cell carcinoma and esophageal stricture.  States that her difficulty swallowing has been progressively worsening recently. She feels okay for swallowing liquids and water, but not solid food. Pt s/p G tube placement as of 1/23.   Clinical Impression  Patient alert, agreeable to PT but did state her abdominal pain was 9/10, RN in room to give meds. At baseline the pt lives with her husband who she endorsed would not be able to provide much physical assistance. Stand pivots to [facility] consistently without a device. was walking with a SPC for longer distances but states it was last in the [facility] ED.   She was able to perform bed mobility with minA, sit <> stand and step pivot transfers several times during session with handheld assist and with RW, CGA. Pt on BSC for several minutes and able to have small BM. She ambulated ~50ft from [facility] to recliner and noted for significant fatigue, decreased activity tolerance. Pt with needs in reach up in recliner at end of session.  Overall the patient demonstrated deficits (see "PT Problem List") that impede the patient's functional abilities, safety, and mobility and would benefit from skilled PT intervention.          If plan is discharge home, recommend the following: A little help with walking and/or transfers;A little help with bathing/dressing/bathroom;Assistance with cooking/housework;Assist for  transportation;Help with stairs or ramp for entrance   Can travel by private vehicle   Yes    Equipment Recommendations Rolling walker (2 wheels)  Recommendations for Other Services       Functional Status Assessment Patient has had a recent decline in their functional status and demonstrates the ability to make significant improvements in function in a reasonable and predictable amount of time.     Precautions / Restrictions Precautions Precautions: Fall Precaution Comments: G tube Restrictions Weight Bearing Restrictions Per Provider Order: No      Mobility  Bed Mobility   Bed Mobility: Supine to Sit     Supine to sit: Min assist, HOB elevated     General bed mobility comments: handheld assist, not necessarily required    Transfers Overall transfer level: Needs assistance Equipment used: Rolling walker (2 wheels), 1 person hand held assist Transfers: Sit to/from Stand, Bed to chair/wheelchair/BSC Sit to Stand: Contact guard assist   Step pivot transfers: Contact guard assist            Ambulation/Gait Ambulation/Gait assistance: Contact guard assist Gait Distance (Feet): 11 Feet Assistive device: Rolling walker (2 wheels)         General Gait Details: very slow, pt fatigued very quickly  Stairs            Wheelchair Mobility     Tilt Bed    Modified Rankin (Stroke Patients Only)       Balance Overall balance assessment: Needs assistance Sitting-balance support: Feet supported, Single extremity supported Sitting balance-Leahy Scale: Fair     Standing balance support: Reliant on assistive device for balance, During  functional activity, Single extremity supported Standing balance-Leahy Scale: Fair                               Pertinent Vitals/Pain Pain Assessment Pain Assessment: 0-10 Pain Score: 9  Pain Location: abdominal pain with movement. Pain Descriptors / Indicators: Guarding, Grimacing, Sore, Moaning Pain  Intervention(s): Limited activity within patient's tolerance, Monitored during session, Repositioned, RN gave pain meds during session    Home Living Family/patient expects to be discharged to:: Private residence Living Arrangements: Spouse/significant other Available Help at Discharge: Family (Per pt report, spouse is not much assistance and has been sick recently.) Type of Home: House Home Access: Stairs to enter Entrance Stairs-Rails: Can reach both Entrance Stairs-Number of Steps: 5 at front door, 3 at back door   Home Layout: One level Home Equipment: Shower seat;Grab bars - tub/shower;Cane - single Librarian, academic (2 wheels);Rollator (4 wheels)      Prior Function Prior Level of Function : Independent/Modified Independent;History of Falls (last six months)             Mobility Comments: Stand pivots to [facility] consistently without a device. was walking with a SPC for longer distances but states it was last in the [facility] ED. ADLs Comments: Generally independent, but has felt more limited over last few weeks.     Extremity/Trunk Assessment   Upper Extremity Assessment Upper Extremity Assessment: Generalized weakness    Lower Extremity Assessment Lower Extremity Assessment: Generalized weakness       Communication      Cognition Arousal: Alert Behavior During Therapy: WFL for tasks assessed/performed Overall Cognitive Status: Within Functional Limits for tasks assessed                                          General Comments      Exercises     Assessment/Plan    PT Assessment Patient needs continued PT services  PT Problem List Decreased strength;Decreased balance;Decreased mobility;Decreased activity tolerance       PT Treatment Interventions DME instruction;Balance training;Gait training;Neuromuscular re-education;Stair training;Functional mobility training;Patient/family education;Therapeutic activities;Therapeutic exercise    PT  Goals (Current goals can be found in the Care Plan section)  Acute Rehab PT Goals Patient Stated Goal: to feel better PT Goal Formulation: With patient Time For Goal Achievement: 06/14/23 Potential to Achieve Goals: Good    Frequency Min 1X/week     Co-evaluation               AM-PAC PT "6 Clicks" Mobility  Outcome Measure Help needed turning from your back to your side while in a flat bed without using bedrails?: A Little Help needed moving from lying on your back to sitting on the side of a flat bed without using bedrails?: A Little Help needed moving to and from a bed to a chair (including a wheelchair)?: A Little Help needed standing up from a chair using your arms (e.g., wheelchair or bedside chair)?: A Little Help needed to walk in hospital room?: A Little Help needed climbing 3-5 steps with a railing? : A Little 6 Click Score: 18    End of Session   Activity Tolerance: Patient tolerated treatment well Patient left: in chair;with call bell/phone within reach;with chair alarm set Nurse Communication: Mobility status PT Visit Diagnosis: Other abnormalities of gait and mobility (R26.89);Difficulty in  walking, not elsewhere classified (R26.2);Muscle weakness (generalized) (M62.81)    Time: 5621-3086 PT Time Calculation (min) (ACUTE ONLY): 24 min   Charges:   PT Evaluation $PT Eval Low Complexity: 1 Low PT Treatments $Therapeutic Activity: 8-22 mins PT General Charges $$ ACUTE PT VISIT: 1 Visit         Olga Coaster PT, DPT 11:40 AM,05/31/23

## 2023-05-31 NOTE — Progress Notes (Addendum)
Patient is alert and oriented X 4.patient transferred to 2 C via bed in stable condition.patient report given to RN Reshonda. No any questions at this time.

## 2023-06-01 ENCOUNTER — Ambulatory Visit: Payer: Medicare HMO

## 2023-06-01 DIAGNOSIS — R1310 Dysphagia, unspecified: Secondary | ICD-10-CM | POA: Diagnosis not present

## 2023-06-01 DIAGNOSIS — K567 Ileus, unspecified: Secondary | ICD-10-CM | POA: Diagnosis not present

## 2023-06-01 LAB — GLUCOSE, CAPILLARY
Glucose-Capillary: 106 mg/dL — ABNORMAL HIGH (ref 70–99)
Glucose-Capillary: 106 mg/dL — ABNORMAL HIGH (ref 70–99)
Glucose-Capillary: 124 mg/dL — ABNORMAL HIGH (ref 70–99)
Glucose-Capillary: 93 mg/dL (ref 70–99)
Glucose-Capillary: 98 mg/dL (ref 70–99)

## 2023-06-01 LAB — MAGNESIUM: Magnesium: 2.3 mg/dL (ref 1.7–2.4)

## 2023-06-01 LAB — PHOSPHORUS: Phosphorus: 4.1 mg/dL (ref 2.5–4.6)

## 2023-06-01 MED ORDER — SALINE SPRAY 0.65 % NA SOLN
1.0000 | NASAL | Status: DC | PRN
  Administered 2023-06-01: 1 via NASAL
  Filled 2023-06-01 (×2): qty 44

## 2023-06-01 NOTE — Plan of Care (Signed)

## 2023-06-01 NOTE — Progress Notes (Signed)
Occupational Therapy Treatment Patient Details Name: Denise Watts MRN: [id number] DOB: [birth year] Today's Date: 06/01/2023   History of present illness Denise Watts is a 68 y.o. female with medical history significant of  multiple cancer including vocal cord squamous cell carcinoma, esophageal stricture, breast cancer, adrenal carcinoma, non-small cell lung cancer (currently is on chemotherapy for lung cancer), HLD, COPD, CAD, dCHF, depression with anxiety, A-fib,. PE and DVT on Lovenox, mycoplasma pneumonia, who presents with dysphagia. Pt has hx of vocal cord squamous cell carcinoma and esophageal stricture.  States that her difficulty swallowing has been progressively worsening recently. She feels okay for swallowing liquids and water, but not solid food. Pt s/p G tube placement as of 1/23   OT comments  Pt seen for OT treatment on this date. Upon arrival to room pt supine in bed, nsg at bedside finishing up tube feeding, agreeable to OT Tx session with some encouragement. OT facilitated ADL management with education and assist as described below. See ADL section for additional details regarding occupational performance. Pt continues to be functionally limited by generalized weakness, decreased activity tolerance, and increased pain with mobility. Pt somewhat resistant to education this date but does return demo understanding of safe RW use and energy conservation strategies. Caregiver at bedside also return verbalizes understanding of education provided t/o session. Pt is progressing toward OT goals and continues to benefit from skilled OT services to maximize return to PLOF and minimize risk of future falls, injury, caregiver burden, and readmission. Will continue to follow POC as written. Discharge recommendation remains appropriate.        If plan is discharge home, recommend the following:  A lot of help with bathing/dressing/bathroom;A little help with bathing/dressing/bathroom;Assist for  transportation;Help with stairs or ramp for entrance;Assistance with cooking/housework   Equipment Recommendations  None recommended by OT    Recommendations for Other Services      Precautions / Restrictions Precautions Precautions: Fall Precaution Comments: G tube Restrictions Weight Bearing Restrictions Per Provider Order: No       Mobility Bed Mobility Overal bed mobility: Needs Assistance Bed Mobility: Supine to Sit, Sit to Supine     Supine to sit: Min assist, HOB elevated Sit to supine: Contact guard assist   General bed mobility comments: Attempted edu on log-roll for improved comfort/independence, however pt declines at this time stating "what's wrong with how I do it?" Educated on strategies to improve abdominal comfort with bed mobility as pt also yelling out in pain with minimal positional changes.    Transfers Overall transfer level: Needs assistance Equipment used: Rolling walker (2 wheels) Transfers: Sit to/from Stand Sit to Stand: Supervision           General transfer comment: Supervision for amb at household dsitance in room. ~10 feet with frequent therapeutic rest breaks.     Balance Overall balance assessment: Needs assistance Sitting-balance support: Feet supported, Single extremity supported Sitting balance-Leahy Scale: Fair     Standing balance support: Reliant on assistive device for balance, During functional activity, Single extremity supported Standing balance-Leahy Scale: Fair                             ADL either performed or assessed with clinical judgement   ADL Overall ADL's : Needs assistance/impaired  General ADL Comments: Continues to be functionally limited by generalized weakness, decreased activity tolerance, and pain with mobility at G-tube site. She is able to amb a short household distance in her room with SUPERVISION and RW for improved safety/balance.  Close CGA during directional changes and pt requires frequent breaks to amb ~10 feet. Declines all other functional activity this date 2/2 fatigue. Educated on falls prevention, safe RW use, bed mobility techniques, positional strategies to maximize safety, comfort and functional independence with ADL management in home and hospital during session. Pt mildly resistant to edu stating "what's wrong with the way I do it?". Caregiver at bedside verbalizes understanding and offer encouragement for pt to continue learning safety strategies.    Extremity/Trunk Assessment Upper Extremity Assessment Upper Extremity Assessment: Generalized weakness   Lower Extremity Assessment Lower Extremity Assessment: Generalized weakness   Cervical / Trunk Assessment Cervical / Trunk Assessment: Normal    Vision Patient Visual Report: No change from baseline     Perception     Praxis      Cognition Arousal: Alert Behavior During Therapy: WFL for tasks assessed/performed Overall Cognitive Status: Within Functional Limits for tasks assessed                                          Exercises Other Exercises Other Exercises: OT facilitated functional mobility/ADL mgt with edu and assist as described above. See ADL section.    Shoulder Instructions       General Comments      Pertinent Vitals/ Pain       Pain Assessment Pain Assessment: Faces Faces Pain Scale: Hurts whole lot Pain Location: abdominal pain with movement. Pain Descriptors / Indicators: Guarding, Grimacing, Sore, Moaning Pain Intervention(s): Limited activity within patient's tolerance, Monitored during session, Repositioned, Premedicated before session  Home Living                                          Prior Functioning/Environment              Frequency  Min 1X/week        Progress Toward Goals  OT Goals(current goals can now be found in the care plan section)  Progress towards  OT goals: Progressing toward goals  Acute Rehab OT Goals Patient Stated Goal: To feel better OT Goal Formulation: With patient Time For Goal Achievement: 06/13/23 Potential to Achieve Goals: Good  Plan      Co-evaluation                 AM-PAC OT "6 Clicks" Daily Activity     Outcome Measure   Help from another person eating meals?: A Little Help from another person taking care of personal grooming?: A Little Help from another person toileting, which includes using toliet, bedpan, or urinal?: A Little Help from another person bathing (including washing, rinsing, drying)?: A Lot Help from another person to put on and taking off regular upper body clothing?: A Little Help from another person to put on and taking off regular lower body clothing?: A Lot 6 Click Score: 16    End of Session Equipment Utilized During Treatment: Gait belt  OT Visit Diagnosis: Other abnormalities of gait and mobility (R26.89);Muscle weakness (generalized) (M62.81);Pain Pain - Right/Left:  (both) Pain - part  of body:  (abdominal)   Activity Tolerance Patient tolerated treatment well   Patient Left in bed;with call bell/phone within reach;with bed alarm set   Nurse Communication Mobility status        Time: 4098-1191 OT Time Calculation (min): 18 min  Charges: OT General Charges $OT Visit: 1 Visit OT Treatments $Self Care/Home Management : 8-22 mins  Rockney Ghee, M.S., OTR/L 06/01/23, 3:22 PM

## 2023-06-01 NOTE — Progress Notes (Signed)
PROGRESS NOTE  Denise Watts    DOB: 01-20-1956, 68 y.o.  ZOX:[id number]    Code Status: Full Code   DOA: 05/24/2023   LOS: 8   Brief hospital course  ASEES MANFREDI is a 68 y.o. female with a PMH significant for multiple cancer including vocal cord squamous cell carcinoma, esophageal stricture, breast cancer, adrenal carcinoma, non-small cell lung cancer (currently is on chemotherapy for lung cancer), HLD, COPD, CAD, dCHF, depression with anxiety, A-fib,. PE and DVT on Lovenox, mycoplasma pneumonia, who presents with progressively worsening dysphagia.  She is direct admit w/ plan for feeding tube placement 1/22  w/ general surgery consult 01/23: placement G-tube, plan for tube feeds tomorrow, heparin gtt tomorrow (recent hx PE) 01/24: ok to use G-tube, continue heparin today and start lovenox tomorrow. If tolerating tube feeds expect discharge tomorrow AM  01/25: significant abd pain / distension this morning. KUB/CT notable for intraluminal gas, some peritoneal air c/w surgery, no obvious obstruction. Concern for ileus. Gtube to gravity. General surgery following. Question aspiration but CXR appears normal except known nodules, abn lung sounds seem related to bronchial, no desaturation, suspect possible aspiration pneumonitis  01/26: Per surgery, ok to restart tube feeds slowly, reengaged w/ RD to assist. Pt still uncomfortable and hasn't had BM, trial suppository/enema, will see if tolerates tube feeds before giving enteral laxatives. Pt encouraged to get OOB to chair / walk if able but she states she is probably not able d/t pain. Will repeat KUB. Will also repeat CXR, concern possible aspiration given coughing, no wheezing, lungs sound better today overall  01/27: BM today, increasing tube feeds and work on ambulating.  Patient reports feeling significantly better after BM 01/28: We are hopeful for DC today however recurrent severe abdominal pain, KUB repeat, appears similar to 01/26.  Repeat  suppository, if/when BM, continue bowel regimen and patient really needs to ambulate  1/29 to present: adjusting feeds to smaller volumes to help prevent bloating. Continues to have slow transit but tolerating. G tube working well. Having routine BMs. Working with PT/OT and has significant weakness. Recommended to go to SNF.  Assessment & Plan  Principal Problem:   Dysphagia Active Problems:   DVT (deep venous thrombosis) (HCC)   Hx of pulmonary embolus   PAF (paroxysmal atrial fibrillation) (HCC)   COPD (chronic obstructive pulmonary disease) (HCC)   Chronic diastolic CHF (congestive heart failure) (HCC)   HLD (hyperlipidemia)   CAD (coronary artery disease)   Non-small cell lung cancer (HCC)   Depression with anxiety   Severe protein-calorie malnutrition (HCC)   Malnutrition of moderate degree  Dysphagia due to esophageal stricture s/p radiation for vocal cord squamous cell carcinoma:  Ileus  constipation S/p G tube placement 1/23- recurrent imaging and exams show stability  Continue G-tube feeds. Smaller volumes if having discomfort/nausea. Likely has some level of ileus.  Continue bowel regimen General surgery following.  Pain management PRN but try to avoid opioids PT/OT- SNF  Hx of DVT (deep venous thrombosis) and hx of pulmonary embolus Continue lovenox tx   PAF (paroxysmal atrial fibrillation) Metoprolol held d/t soft BP but rate is back up, BP ok  resume low dose beta blocker metoprolol XR 12.5 mg daily Restart home lovenox    COPD (chronic obstructive pulmonary disease): Stable bronchodilators and as needed Mucinex   Chronic diastolic CHF (congestive heart failure)  Clinically does not seem to have CHF exacerbation. watch volume status closely   HLD  CAD Crestor   Non-small  cell lung cancer  On chemotherapy, last dose was 2 weeks ago. Follow-up with Dr. Rana Snare   Depression with anxiety Current new home [facility]  Body mass index is 21.37 kg/m.  VTE  ppx: on lovenox  Diet:     Diet   Diet full liquid Fluid consistency: Thin   Consultants: Surgery   Subjective 06/01/23    Pt reports doing well. Had another BM. Bloating is improved with smaller volume feeds.   Objective   Vitals:   05/31/23 1831 05/31/23 1958 06/01/23 0425 06/01/23 0759  BP: (!) 82/71 99/72 104/72 (!) 96/57  Pulse: 83 86 82 74  Resp: 16 14 14 16   Temp: 98 F (36.7 C) 97.7 F (36.5 C) 98 F (36.7 C) 97.9 F (36.6 C)  TempSrc: Oral Oral  Oral  SpO2: 99% 91% 93% 94%  Weight:      Height:        Intake/Output Summary (Last 24 hours) at 06/01/2023 0808 Last data filed at 05/31/2023 2200 Gross per 24 hour  Intake --  Output 601 ml  Net -601 ml   Filed Weights   05/29/23 0500 05/30/23 0304 05/31/23 0412  Weight: 53.6 kg 54.1 kg 51.3 kg     Physical Exam:  General: awake, alert, NAD. Frail-appearing  HEENT: atraumatic, clear conjunctiva, anicteric sclera, MMM, hearing grossly normal Respiratory: normal respiratory effort. Cardiovascular: quick capillary refill Gastrointestinal: soft, mildly distended. Tender to light touch in epigastric area. G-tube in place without leakage or infection.  Nervous: A&O x3. no gross focal neurologic deficits, normal speech Extremities: moves all equally, no edema, normal tone Skin: dry, intact, normal temperature, normal color. No rashes, lesions or ulcers on exposed skin Psychiatry: normal mood, congruent affect  Labs   I have personally reviewed the following labs and imaging studies CBC    Component Value Date/Time   WBC 3.9 (L) 05/31/2023 0456   RBC 3.02 (L) 05/31/2023 0456   HGB 10.0 (L) 05/31/2023 0456   HGB 10.2 (L) 05/22/2023 1140   HGB 14.0 11/03/2021 1551   HCT 31.3 (L) 05/31/2023 0456   HCT 42.1 11/03/2021 1551   PLT 197 05/31/2023 0456   PLT 255 05/22/2023 1140   PLT 216 11/03/2021 1551   MCV 103.6 (H) 05/31/2023 0456   MCV 97 11/03/2021 1551   MCV 100 04/08/2014 0409   MCH 33.1 05/31/2023  0456   MCHC 31.9 05/31/2023 0456   RDW 15.1 05/31/2023 0456   RDW 12.9 11/03/2021 1551   RDW 12.7 04/08/2014 0409   LYMPHSABS 0.6 (L) 05/22/2023 1140   LYMPHSABS 1.2 11/03/2021 1551   LYMPHSABS 0.8 (L) 04/08/2014 0409   MONOABS 0.6 05/22/2023 1140   MONOABS 0.7 04/08/2014 0409   EOSABS 0.1 05/22/2023 1140   EOSABS 0.2 11/03/2021 1551   EOSABS 0.0 04/08/2014 0409   BASOSABS 0.0 05/22/2023 1140   BASOSABS 0.0 11/03/2021 1551   BASOSABS 0.0 04/08/2014 0409      Latest Ref Rng & Units 05/31/2023    4:56 AM 05/29/2023    6:34 AM 05/28/2023    8:04 AM  BMP  Glucose 70 - 99 mg/dL 161  096  85   BUN 8 - 23 mg/dL 10  7  6    Creatinine 0.44 - 1.00 mg/dL 0.45  4.09  8.11   Sodium 135 - 145 mmol/L 140  140  139   Potassium 3.5 - 5.1 mmol/L 4.3  3.7  3.9   Chloride 98 - 111 mmol/L 102  106  105   CO2 22 - 32 mmol/L 28  28  29    Calcium 8.9 - 10.3 mg/dL 8.4  8.1  8.2     DG Abd Portable 1V Result Date: 05/30/2023 CLINICAL DATA:  Abdominal pain EXAM: PORTABLE ABDOMEN - 1 VIEW COMPARISON:  05/28/2023 FINDINGS: PEG tube in LEFT upper quadrant. No dilated large or small bowel. Gas and stool in the rectum. IMPRESSION: No bowel obstruction. Electronically Signed   By: Genevive Bi M.D.   On: 05/30/2023 16:11    Disposition Plan & Communication  Patient status: Inpatient  Admitted From: Home Planned disposition location: Skilled nursing facility Anticipated discharge date: 1/31 pending dispo  Family Communication: none at bedside    Author: Leeroy Bock, DO [facility] 06/01/2023, 8:08 AM   Available by Epic secure chat 7AM-7PM. If 7PM-7AM, please contact night-coverage.  [facility] contact information found on [email or web address].

## 2023-06-02 ENCOUNTER — Ambulatory Visit: Payer: Medicare HMO

## 2023-06-02 DIAGNOSIS — R1310 Dysphagia, unspecified: Secondary | ICD-10-CM | POA: Diagnosis not present

## 2023-06-02 DIAGNOSIS — K567 Ileus, unspecified: Secondary | ICD-10-CM | POA: Diagnosis not present

## 2023-06-02 LAB — BASIC METABOLIC PANEL
Anion gap: 9 (ref 5–15)
BUN: 12 mg/dL (ref 8–23)
CO2: 28 mmol/L (ref 22–32)
Calcium: 8.5 mg/dL — ABNORMAL LOW (ref 8.9–10.3)
Chloride: 104 mmol/L (ref 98–111)
Creatinine, Ser: 0.46 mg/dL (ref 0.44–1.00)
GFR, Estimated: >60 mL/min (ref >60)
Glucose, Bld: 93 mg/dL (ref 70–99)
Potassium: 4.3 mmol/L (ref 3.5–5.1)
Sodium: 141 mmol/L (ref 135–145)

## 2023-06-02 LAB — GLUCOSE, CAPILLARY
Glucose-Capillary: 76 mg/dL (ref 70–99)
Glucose-Capillary: 97 mg/dL (ref 70–99)
Glucose-Capillary: 166 mg/dL — ABNORMAL HIGH (ref 70–99)
Glucose-Capillary: 113 mg/dL — ABNORMAL HIGH (ref 70–99)
Glucose-Capillary: 132 mg/dL — ABNORMAL HIGH (ref 70–99)
Glucose-Capillary: 165 mg/dL — ABNORMAL HIGH (ref 70–99)

## 2023-06-02 MED ORDER — OSMOLITE 1.5 CAL PO LIQD: 1200.0000 mL | ORAL | Status: DC

## 2023-06-02 MED ORDER — ORAL CARE MOUTH RINSE: 15.0000 mL | OROMUCOSAL | Status: DC | PRN

## 2023-06-02 MED ORDER — ORAL CARE MOUTH RINSE
15.0000 mL | OROMUCOSAL | Status: DC
  Administered 2023-06-02 – 2023-06-05 (×10): 15 mL via OROMUCOSAL

## 2023-06-02 MED ORDER — OSMOLITE 1.5 CAL PO LIQD
600.0000 mL | ORAL | Status: DC
  Administered 2023-06-02 – 2023-06-04 (×2): 600 mL

## 2023-06-02 NOTE — Progress Notes (Signed)
Physical Therapy Treatment Patient Details Name: Denise Watts MRN: [id number] DOB: 08/09/1955 Today's Date: 06/02/2023   History of Present Illness Denise Watts is a 68 y.o. female with medical history significant of  multiple cancer including vocal cord squamous cell carcinoma, esophageal stricture, breast cancer, adrenal carcinoma, non-small cell lung cancer (currently is on chemotherapy for lung cancer), HLD, COPD, CAD, dCHF, depression with anxiety, A-fib,. PE and DVT on Lovenox, mycoplasma pneumonia, who presents with dysphagia. Pt has hx of vocal cord squamous cell carcinoma and esophageal stricture.  States that her difficulty swallowing has been progressively worsening recently. She feels okay for swallowing liquids and water, but not solid food. Pt s/p G tube placement as of 1/23    PT Comments  Pt was pleasant and although fatigued agreed to participate with PT services with min encouragement and education on physiological benefits of activity.  Pt required extra time and effort with functional tasks along with cuing for sequencing but no physical assist.  Pt ambulated with very slow cadence and short B step length but was steady with no overt LOB.  Pt reported no adverse symptoms during the session other than abdominal pain with SpO2 and HR WNL on room air.  Pt will benefit from continued PT services upon discharge to safely address deficits listed in patient problem list for decreased caregiver assistance and eventual return to PLOF.       If plan is discharge home, recommend the following: A little help with walking and/or transfers;A little help with bathing/dressing/bathroom;Assistance with cooking/housework;Assist for transportation;Help with stairs or ramp for entrance   Can travel by private vehicle     Yes  Equipment Recommendations       Recommendations for Other Services       Precautions / Restrictions Precautions Precautions: Fall Precaution Comments: G tube LLQ, port  RUQ Restrictions Weight Bearing Restrictions Per Provider Order: No     Mobility  Bed Mobility Overal bed mobility: Needs Assistance Bed Mobility: Supine to Sit, Sit to Supine     Supine to sit: Supervision Sit to supine: Supervision   General bed mobility comments: Extra time, effort, and use of bed rail    Transfers Overall transfer level: Needs assistance Equipment used: Rolling walker (2 wheels) Transfers: Sit to/from Stand Sit to Stand: Contact guard assist           General transfer comment: Min verbal cues for hand placement with fair eccentric and concentric control and stability    Ambulation/Gait Ambulation/Gait assistance: Contact guard assist Gait Distance (Feet): 30 Feet x 1, 15 Feet x 1 Assistive device: Rolling walker (2 wheels) Gait Pattern/deviations: Step-through pattern, Decreased step length - right, Decreased step length - left Gait velocity: decreased     General Gait Details: Very slow cadence with short B step length but steady with no overt LOB   Stairs             Wheelchair Mobility     Tilt Bed    Modified Rankin (Stroke Patients Only)       Balance Overall balance assessment: Needs assistance Sitting-balance support: Feet supported, Single extremity supported Sitting balance-Leahy Scale: Good     Standing balance support: Reliant on assistive device for balance, During functional activity, Bilateral upper extremity supported Standing balance-Leahy Scale: Fair                              Cognition Arousal: Alert Behavior  During Therapy: WFL for tasks assessed/performed Overall Cognitive Status: Within Functional Limits for tasks assessed                                          Exercises Total Joint Exercises Ankle Circles/Pumps: AROM, Strengthening, Both, 10 reps Quad Sets: Strengthening, Both, 10 reps Long Arc Quad: Strengthening, Both, 10 reps Static unsupported sitting x 8  min for improved activity tolerance    General Comments        Pertinent Vitals/Pain Pain Assessment Pain Assessment: 0-10 Pain Score: 6  Pain Location: abdominal Pain Descriptors / Indicators: Sore Pain Intervention(s): Monitored during session, Premedicated before session, Repositioned, Patient requesting pain meds-RN notified, RN gave pain meds during session    Home Living                          Prior Function            PT Goals (current goals can now be found in the care plan section) Progress towards PT goals: Progressing toward goals    Frequency    Min 1X/week      PT Plan      Co-evaluation              AM-PAC PT "6 Clicks" Mobility   Outcome Measure  Help needed turning from your back to your side while in a flat bed without using bedrails?: A Little Help needed moving from lying on your back to sitting on the side of a flat bed without using bedrails?: A Little Help needed moving to and from a bed to a chair (including a wheelchair)?: A Little Help needed standing up from a chair using your arms (e.g., wheelchair or bedside chair)?: A Little Help needed to walk in hospital room?: A Little Help needed climbing 3-5 steps with a railing? : A Lot 6 Click Score: 17    End of Session   Activity Tolerance: Patient tolerated treatment well Patient left: in bed;with call bell/phone within reach;with bed alarm set;with family/visitor present (Pt declined OOB to chair) Nurse Communication: Mobility status PT Visit Diagnosis: Other abnormalities of gait and mobility (R26.89);Difficulty in walking, not elsewhere classified (R26.2);Muscle weakness (generalized) (M62.81);Pain Pain - part of body:  (abdominal pain)     Time: 7829-5621 PT Time Calculation (min) (ACUTE ONLY): 29 min  Charges:    $Gait Training: 8-22 mins $Therapeutic Exercise: 8-22 mins PT General Charges $$ ACUTE PT VISIT: 1 Visit                     D. Scott Davinia Riccardi PT,  DPT 06/02/23, 4:08 PM

## 2023-06-02 NOTE — Plan of Care (Signed)
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
## 2023-06-02 NOTE — Progress Notes (Signed)
RE:  Denise Watts       Date of Birth:  2056-04-09     Date:  06/02/23        To Whom It May Concern:  Please be advised that the above-named patient will require a short-term nursing home stay - anticipated 30 days or less for rehabilitation and strengthening.  The plan is for return home.                 MD signature                Date

## 2023-06-02 NOTE — Progress Notes (Signed)
Nutrition Follow-up  DOCUMENTATION CODES:   Non-severe (moderate) malnutrition in context of chronic illness  INTERVENTION:   -Continue full liquid diet -Continue MVI -Transition to nocturnal feedings via g-tube:  Osmolite 1.5 @ 75 ml/hr over 16 hour period (1800-1000)   60 ml free water flush 4 times per day   Tube feeding regimen provides 1800 kcal (100% of needs), 75 grams of protein, and 1394 ml of H2O.   NUTRITION DIAGNOSIS:   Moderate Malnutrition related to chronic illness (vocal cord squamous cell carcinoma, COPD, CHF) as evidenced by mild fat depletion, moderate fat depletion, mild muscle depletion, moderate muscle depletion.  Ongoing  GOAL:   Patient will meet greater than or equal to 90% of their needs  Progressing   MONITOR:   PO intake, TF tolerance  REASON FOR ASSESSMENT:   Consult Enteral/tube feeding initiation and management  ASSESSMENT:   Pt with medical history significant of  multiple cancer including vocal cord squamous cell carcinoma, esophageal stricture, breast cancer, adrenal carcinoma, non-small cell lung cancer (currently is on chemotherapy for lung cancer), HLD, COPD, CAD, dCHF, depression with anxiety, A-fib,. PE and DVT on Lovenox, mycoplasma pneumonia, who presents with dysphagia.  1/23- s/p g-tube placement  1/25- increased work of breathing with concern for aspiration pneumonia, abdominal x-ray revealed distended loops of bowel, g-tube in correct position per CT, g-tube placed to gravity 1/25-TF re-restarted  Reviewed I/O's: -1.3 L x 24 hours and +2.7 L since admission   Per surgery notes, g-tube with no resistance and leaking. He reports concern over TF volume. Multiple KUBs reveal tubes in adequate postioning of tube.   Case discussed with MD and RN; report concern that pt is not tolerating volume of tube feeding. MD requesting RD adjust feeding schedule. Case discussed with RN, who reports pt with difficulty tolerating a half can  reporting "it's too much".   Spoke with pt at bedside, who was pleasant and in good spirits today. Pt eager to engage with RD. Pt shares that she continues to feel full, stating the food she eats and the volume of TF is "just too much" and experiences symptoms of fullness when a half carton of TF is administered. Pt reports she is not eating much on meal strays, usually a cup of soup at best. RN confirmed pt only taking bites and sips at meals.   Discussed options with pt regarding revision of feeding schedule and possibility of transitioning to bolus feedings. Pt reports overall she was able to tolerate TF best at continuous rate. Suspect that pt will better tolerate due to slower infusion of volume. Pt amenable and appreciative of plan. Revisions discussed with MD, TOC, and RN. Plan to d/c to SNF at discharge.   Wt has been stable over the past week.   Medications reviewed and include lovenox, and lactulose.   Labs reviewed: CBGS: 76-166 (inpatient orders for glycemic control are none).    Diet Order:   Diet Order             DIET SOFT Fluid consistency: Thin  Diet effective now                   EDUCATION NEEDS:   Education needs have been addressed  Skin:  Skin Assessment: Skin Integrity Issues: Skin Integrity Issues:: Incisions Incisions: closed abdomen s/p g-tube placement  Last BM:  06/02/23 (type 4)  Height:   Ht Readings from Last 1 Encounters:  05/27/23 5\' 1"  (1.549 m)    Weight:  Wt Readings from Last 1 Encounters:  06/01/23 51.8 kg    Ideal Body Weight:  47.7 kg  BMI:  Body mass index is 21.58 kg/m.  Estimated Nutritional Needs:   Kcal:  1600-1800  Protein:  75-95 grams  Fluid:  > 1.6 L    Levada Schilling, RD, LDN, CDCES Registered Dietitian III Certified Diabetes Care and Education Specialist If unable to reach this RD, please use "RD Inpatient" group chat on secure chat between hours of 8am-4 pm daily

## 2023-06-02 NOTE — Progress Notes (Signed)
PROGRESS NOTE  Denise Watts    DOB: [birth year], 68 y.o.  QMV:[id number]    Code Status: Full Code   DOA: 05/24/2023   LOS: 9   Brief hospital course  Denise Watts is a 68 y.o. female with a PMH significant for multiple cancer including vocal cord squamous cell carcinoma, esophageal stricture, breast cancer, adrenal carcinoma, non-small cell lung cancer (currently is on chemotherapy for lung cancer), HLD, COPD, CAD, dCHF, depression with anxiety, A-fib,. PE and DVT on Lovenox, mycoplasma pneumonia, who presents with progressively worsening dysphagia.  She is direct admit w/ plan for feeding tube placement 1/22  w/ general surgery consult 01/23: placement G-tube, plan for tube feeds tomorrow, heparin gtt tomorrow (recent hx PE) 01/24: ok to use G-tube, continue heparin today and start lovenox tomorrow. If tolerating tube feeds expect discharge tomorrow AM  01/25: significant abd pain / distension this morning. KUB/CT notable for intraluminal gas, some peritoneal air c/w surgery, no obvious obstruction. Concern for ileus. Gtube to gravity. General surgery following. Question aspiration but CXR appears normal except known nodules, abn lung sounds seem related to bronchial, no desaturation, suspect possible aspiration pneumonitis  01/26: Per surgery, ok to restart tube feeds slowly, reengaged w/ RD to assist. Pt still uncomfortable and hasn't had BM, trial suppository/enema, will see if tolerates tube feeds before giving enteral laxatives. Pt encouraged to get OOB to chair / walk if able but she states she is probably not able d/t pain. Will repeat KUB. Will also repeat CXR, concern possible aspiration given coughing, no wheezing, lungs sound better today overall  01/27: BM today, increasing tube feeds and work on ambulating.  Patient reports feeling significantly better after BM 01/28: We are hopeful for DC today however recurrent severe abdominal pain, KUB repeat, appears similar to 01/26.  Repeat  suppository, if/when BM, continue bowel regimen and patient really needs to ambulate  1/29 to present: adjusting feeds to smaller volumes to help prevent bloating. Continues to have slow transit but tolerating. G tube working well. Having routine BMs. Working with PT/OT and has significant weakness. Recommended to go to SNF.  Today we are transitioning to continuous nighttime feeds to help prevent bloating while eating during the day Assessment & Plan  Principal Problem:   Dysphagia Active Problems:   DVT (deep venous thrombosis) (HCC)   Hx of pulmonary embolus   PAF (paroxysmal atrial fibrillation) (HCC)   COPD (chronic obstructive pulmonary disease) (HCC)   Chronic diastolic CHF (congestive heart failure) (HCC)   HLD (hyperlipidemia)   CAD (coronary artery disease)   Non-small cell lung cancer (HCC)   Depression with anxiety   Severe protein-calorie malnutrition (HCC)   Malnutrition of moderate degree  Dysphagia due to esophageal stricture s/p radiation for vocal cord squamous cell carcinoma:  Ileus  constipation S/p G tube placement 1/23- recurrent imaging and exams show stability  Continue G-tube feeds. Transitioning to PO continuous feeds today so that she will hopefully be able to tolerate more PO volume during the day with less bloating.  Patient wants to trial soft foods today. Instructed her to let nurse know if she has even the slightest issue with swallowing so we can back down to fluids again Continue bowel regimen General surgery following.  Pain management PRN but try to avoid opioids PT/OT- SNF  Hx of DVT (deep venous thrombosis) and hx of pulmonary embolus Continue lovenox tx   PAF (paroxysmal atrial fibrillation) Metoprolol held d/t soft BP but rate is back up,  BP ok  resume low dose beta blocker metoprolol XR 12.5 mg daily Restart home lovenox    COPD (chronic obstructive pulmonary disease): Stable bronchodilators and as needed Mucinex   Chronic diastolic  CHF (congestive heart failure)  Clinically does not seem to have CHF exacerbation. watch volume status closely   HLD  CAD Crestor   Non-small cell lung cancer  On chemotherapy, last dose was 2 weeks ago. Per Dr. Smith Robert, she will not continue treatment until she has completed SNF and they will address at follow up appointment if she will resume at that point   Depression with anxiety- no active treatment  Body mass index is 21.58 kg/m.  VTE ppx: on lovenox  Diet:     Diet   Diet full liquid Fluid consistency: Thin   Consultants: Surgery   Subjective 06/02/23    Pt reports doing well. She continues to have significant bloating with feeds and low PO intake due to the timing of her tube feeds and meals occurring at same time. No nausea/vomiting. Last BM 2 days ago.   Objective   Vitals:   06/01/23 0759 06/01/23 1542 06/01/23 1941 06/02/23 0456  BP: (!) 96/57 (!) 87/54 (!) 105/57 135/70  Pulse: 74 77 76 75  Resp: 16 16 12 16   Temp: 97.9 F (36.6 C) 97.6 F (36.4 C) (!) 97.5 F (36.4 C) (!) 97.5 F (36.4 C)  TempSrc: Oral Oral Oral Oral  SpO2: 94% 99% 98% 97%  Weight:      Height:        Intake/Output Summary (Last 24 hours) at 06/02/2023 0816 Last data filed at 06/02/2023 0517 Gross per 24 hour  Intake --  Output 1300 ml  Net -1300 ml   Filed Weights   05/30/23 0304 05/31/23 0412 06/01/23 0744  Weight: 54.1 kg 51.3 kg 51.8 kg     Physical Exam:  General: awake, alert, NAD. Frail-appearing  HEENT: atraumatic, clear conjunctiva, anicteric sclera, MMM, hearing grossly normal Respiratory: normal respiratory effort. Cardiovascular: quick capillary refill Gastrointestinal: soft, mildly distended. Tender to light touch in epigastric area. G-tube in place without leakage or infection.  Nervous: A&O x3. no gross focal neurologic deficits, normal speech Extremities: moves all equally, no edema, normal tone Skin: dry, intact, normal temperature, normal color. No  rashes, lesions or ulcers on exposed skin Psychiatry: normal mood, congruent affect  Labs   I have personally reviewed the following labs and imaging studies CBC    Component Value Date/Time   WBC 3.9 (L) 05/31/2023 0456   RBC 3.02 (L) 05/31/2023 0456   HGB 10.0 (L) 05/31/2023 0456   HGB 10.2 (L) 05/22/2023 1140   HGB 14.0 11/03/2021 1551   HCT 31.3 (L) 05/31/2023 0456   HCT 42.1 11/03/2021 1551   PLT 197 05/31/2023 0456   PLT 255 05/22/2023 1140   PLT 216 11/03/2021 1551   MCV 103.6 (H) 05/31/2023 0456   MCV 97 11/03/2021 1551   MCV 100 04/08/2014 0409   MCH 33.1 05/31/2023 0456   MCHC 31.9 05/31/2023 0456   RDW 15.1 05/31/2023 0456   RDW 12.9 11/03/2021 1551   RDW 12.7 04/08/2014 0409   LYMPHSABS 0.6 (L) 05/22/2023 1140   LYMPHSABS 1.2 11/03/2021 1551   LYMPHSABS 0.8 (L) 04/08/2014 0409   MONOABS 0.6 05/22/2023 1140   MONOABS 0.7 04/08/2014 0409   EOSABS 0.1 05/22/2023 1140   EOSABS 0.2 11/03/2021 1551   EOSABS 0.0 04/08/2014 0409   BASOSABS 0.0 05/22/2023 1140   BASOSABS  0.0 11/03/2021 1551   BASOSABS 0.0 04/08/2014 0409      Latest Ref Rng & Units 06/02/2023    5:10 AM 05/31/2023    4:56 AM 05/29/2023    6:34 AM  BMP  Glucose 70 - 99 mg/dL 93  952  841   BUN 8 - 23 mg/dL 12  10  7    Creatinine 0.44 - 1.00 mg/dL 3.24  4.01  0.27   Sodium 135 - 145 mmol/L 141  140  140   Potassium 3.5 - 5.1 mmol/L 4.3  4.3  3.7   Chloride 98 - 111 mmol/L 104  102  106   CO2 22 - 32 mmol/L 28  28  28    Calcium 8.9 - 10.3 mg/dL 8.5  8.4  8.1     No results found.   Disposition Plan & Communication  Patient status: Inpatient  Admitted From: Home Planned disposition location: Skilled nursing facility Anticipated discharge date: 2/3 pending dispo  Family Communication: none at bedside    Author: Leeroy Bock, DO [facility] 06/02/2023, 8:16 AM   Available by Epic secure chat 7AM-7PM. If 7PM-7AM, please contact night-coverage.  [facility] contact information found  on [email or web address].

## 2023-06-02 NOTE — Plan of Care (Signed)
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
## 2023-06-03 ENCOUNTER — Encounter: Payer: Self-pay | Admitting: Internal Medicine

## 2023-06-03 DIAGNOSIS — I5032 Chronic diastolic (congestive) heart failure: Secondary | ICD-10-CM | POA: Diagnosis not present

## 2023-06-03 DIAGNOSIS — Z86711 Personal history of pulmonary embolism: Secondary | ICD-10-CM | POA: Diagnosis not present

## 2023-06-03 DIAGNOSIS — R52 Pain, unspecified: Secondary | ICD-10-CM

## 2023-06-03 DIAGNOSIS — R1310 Dysphagia, unspecified: Secondary | ICD-10-CM | POA: Diagnosis not present

## 2023-06-03 LAB — GLUCOSE, CAPILLARY
Glucose-Capillary: 116 mg/dL — ABNORMAL HIGH (ref 70–99)
Glucose-Capillary: 92 mg/dL (ref 70–99)
Glucose-Capillary: 95 mg/dL (ref 70–99)
Glucose-Capillary: 77 mg/dL (ref 70–99)
Glucose-Capillary: 91 mg/dL (ref 70–99)

## 2023-06-03 MED ORDER — OXYCODONE HCL 5 MG PO TABS
7.5000 mg | ORAL_TABLET | ORAL | Status: DC | PRN
  Administered 2023-06-03 – 2023-06-05 (×6): 7.5 mg via ORAL
  Filled 2023-06-03 (×6): qty 2

## 2023-06-03 NOTE — Assessment & Plan Note (Signed)
06-03-2023 stable.

## 2023-06-03 NOTE — Assessment & Plan Note (Signed)
06-03-2023 stable. On statin.  06-04-2023 stable.

## 2023-06-03 NOTE — Assessment & Plan Note (Signed)
06-03-2023 s/p PEG 06-04-2023 Nutrition Status: Nutrition Problem: Moderate Malnutrition Etiology: chronic illness (vocal cord squamous cell carcinoma, COPD, CHF) Signs/Symptoms: mild fat depletion, moderate fat depletion, mild muscle depletion, moderate muscle depletion Interventions: Tube feeding

## 2023-06-03 NOTE — Assessment & Plan Note (Signed)
06-03-2023 continue with toprol-xl 12.5 mg daily. Remains on lovenox bid. 06-04-2023 stable. 06-05-2023 stable. Continue Toprol-xl 12.5 mg daily. Discussed with oncology. Pt can go on Eliquis. She has a PEG now. Was only on lovenox because she could not swallow Eliquis pill.

## 2023-06-03 NOTE — Progress Notes (Signed)
PROGRESS NOTE    Denise Watts  ZOX:[id number] DOB: 1956-01-01 DOA: 05/24/2023 PCP: Sherlyn Hay, DO  Subjective: Pt seen and examined. C/o of chronic abd pain. 5 mg oxycodone not effective  Pt with hx of multiple cancers.   Hospital Course: Hospital course / significant events:   HPI:  Denise Watts is a 68 y.o. female with medical history significant of  multiple cancer including vocal cord squamous cell carcinoma, esophageal stricture, breast cancer, adrenal carcinoma, non-small cell lung cancer (currently is on chemotherapy for lung cancer), HLD, COPD, CAD, dCHF, depression with anxiety, A-fib,. PE and DVT on Lovenox, mycoplasma pneumonia, who presents with dysphagia. Pt has hx of vocal cord squamous cell carcinoma and esophageal stricture.  States that her difficulty swallowing has been progressively worsening recently. She feels okay for swallowing liquids and water, but not solid food. She is direct admit w/ plan for feeding tube placement.   01/22: admitted to hospitalist service w/ general surgery consult 01/23: placement G-tube, plan for tube feeds tomorrow, heparin gtt tomorrow (recent hx PE) 01/24: ok to use G-tube, continue heparin today and start lovenox tomorrow. If tolerating tube feeds expect discharge tomorrow AM  01/25: significant abd pain / distension this morning. KUB/CT notable for intraluminal gas, some peritoneal air c/w surgery, no obvious obstruction. Concern for ileus. Gtube to gravity. General surgery following. Question aspiration but CXR appears normal except known nodules, abn lung sounds seem related to bronchial, no desaturation, suspect possible aspiration pneumonitis  01/26: Per surgery, ok to restart tube feeds slowly, reengaged w/ RD to assist. Pt still uncomfortable and hasn't had BM, trial suppository/enema, will see if tolerates tube feeds before giving enteral laxatives. Pt encouraged to get OOB to chair / walk if able but she states she is probably not  able d/t pain. Will repeat KUB. Will also repeat CXR, concern possible aspiration given coughing, no wheezing, lungs sound better today overall  01/27: finally had BM today, increasing tube feeds later today/tomorrow and work on ambulating.  Patient reports feeling significantly better after BM 01/28: We are hopeful for DC today however recurrent severe abdominal pain, KUB repeat, appears similar to 01/26.  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 S/p G tube placement on 05-25-2023.  06-03-2023 continue with tube feeds  Uncontrolled pain 06-03-2023 on fentanyl patch. Pt with multiple cancers.  On oxycodone 5 mg prn. Will increase to 7.5 mg prn to help with pain.  Severe protein-calorie malnutrition (HCC) 06-03-2023 s/p PEG  Hx of pulmonary embolus Continue with lovenox bid.  CAD (coronary artery disease) 06-03-2023 stable. On statin.   Depression with anxiety 06-03-2023 chronic.  Chronic diastolic CHF (congestive heart failure) (HCC) 06-03-2023 stable. Euvolemic.  COPD (chronic obstructive pulmonary disease) (HCC) 06-03-2023 stable.  DVT (deep venous thrombosis) (HCC) Continue with lovenox BID.  Non-small cell lung cancer (HCC) 06-03-2023 follows with oncology.  PAF (paroxysmal atrial fibrillation) (HCC) 06-03-2023 continue with toprol-xl 12.5 mg daily. Remains on lovenox bid.  HLD (hyperlipidemia) 06-03-2023 on crestor   DVT prophylaxis:  Lovenox   Code Status: Full Code Family Communication: no family at bedside Disposition Plan: SNF Reason for continuing need for hospitalization: medically stable for DC to SNF.  Objective: Vitals:   06/02/23 1635 06/02/23 2031 06/03/23  0348 06/03/23 1012   BP: 98/72 119/63 120/61 107/62  Pulse: 81 91 90 90  Resp: 19 16 12 18   Temp: 97.8 F (36.6 C) 98.8 F (37.1 C) 97.9 F (36.6 C) 98.3 F (36.8 C)  TempSrc: Oral Oral Oral Oral  SpO2: 98% 100% 96% 100%  Weight:      Height:        Intake/Output Summary (Last 24 hours) at 06/03/2023 1438 Last data filed at 06/03/2023 0900 Gross per 24 hour  Intake 120 ml  Output 250 ml  Net -130 ml   Filed Weights   05/30/23 0304 05/31/23 0412 06/01/23 0744  Weight: 54.1 kg 51.3 kg 51.8 kg    Examination:  Physical Exam Vitals and nursing note reviewed.  Constitutional:      Comments: Pt chronically ill  HENT:     Head: Normocephalic and atraumatic.     Nose: Nose normal.  Eyes:     General: No scleral icterus. Cardiovascular:     Rate and Rhythm: Normal rate and regular rhythm.  Pulmonary:     Effort: Pulmonary effort is normal.  Abdominal:     General: Bowel sounds are normal.     Comments: +PEG  Musculoskeletal:     Right lower leg: No edema.     Left lower leg: No edema.  Skin:    Capillary Refill: Capillary refill takes less than 2 seconds.  Neurological:     Mental Status: She is alert and oriented to person, place, and time.     Data Reviewed: I have personally reviewed following labs and imaging studies  CBC: Recent Labs  Lab 05/28/23 0804 05/29/23 0634 05/31/23 0456  WBC 5.3 3.8* 3.9*  HGB 9.9* 10.5* 10.0*  HCT 31.8* 33.4* 31.3*  MCV 104.6* 104.0* 103.6*  PLT 189 189 197   Basic Metabolic Panel: Recent Labs  Lab 05/27/23 1857 05/28/23 0804 05/29/23 0634 05/31/23 0456 06/01/23 0515 06/02/23 0510  NA  --  139 140 140  --  141  K  --  3.9 3.7 4.3  --  4.3  CL  --  105 106 102  --  104  CO2  --  29 28 28   --  28  GLUCOSE  --  85 124* 103*  --  93  BUN  --  6* 7* 10  --  12  CREATININE  --  0.45 0.40* 0.43*  --  0.46  CALCIUM  --  8.2* 8.1* 8.4*  --  8.5*  MG 2.0  --   --   --  2.3  --   PHOS 2.3*  --   --   --  4.1  --    GFR: Estimated  Creatinine Clearance: 50.1 mL/min (by C-G formula based on SCr of 0.46 mg/dL). BNP (last 3 results) Recent Labs    05/10/23 1228 05/24/23 1903  BNP 124.3* 73.7   CBG: Recent Labs  Lab 06/02/23 2036 06/02/23 2302 06/03/23 0350 06/03/23 0810 06/03/23 1132  GLUCAP 132* 165* 116* 92 95   Scheduled Meds:  Chlorhexidine Gluconate Cloth  6 each Topical Q0600   enoxaparin (LOVENOX) injection  50 mg Subcutaneous Q12H   fentaNYL  1 patch Transdermal Q72H   free water  60 mL Per Tube 5 X Daily   lactulose  20 g Per Tube BID   metoprolol succinate  12.5 mg Oral Daily   multivitamin  15 mL Oral Daily   omeprazole  40 mg Oral Daily   mouth  rinse  15 mL Mouth Rinse 4 times per day   rOPINIRole  0.25 mg Oral QHS   rosuvastatin  40 mg Oral Daily   senna  1 tablet Per Tube Daily   simethicone  80 mg Per Tube QID   sodium chloride flush  10-40 mL Intracatheter Q12H   Continuous Infusions:  feeding supplement (OSMOLITE 1.5 CAL) Stopped (06/03/23 0520)     LOS: 10 days   Time spent: 40 minutes  Carollee Herter, DO  [facility]  06/03/2023, 2:38 PM

## 2023-06-03 NOTE — Assessment & Plan Note (Addendum)
Continue with lovenox BID.

## 2023-06-03 NOTE — Assessment & Plan Note (Signed)
06-03-2023 on crestor 06-04-2023 stable.  06-05-2023 stable

## 2023-06-03 NOTE — Plan of Care (Signed)

## 2023-06-03 NOTE — Assessment & Plan Note (Signed)
06-03-2023 follows with oncology. 06-04-2023 pt will f/u with oncology.  06-05-2023 stable. F/u with oncology.

## 2023-06-03 NOTE — Assessment & Plan Note (Signed)
Continue with lovenox bid.

## 2023-06-03 NOTE — Assessment & Plan Note (Signed)
06-03-2023 on fentanyl patch. Pt with multiple cancers.  On oxycodone 5 mg prn. Will increase to 7.5 mg prn to help with pain. 06-04-2023 continue fentanyl patch 12 mcg/hr. Oxycodone 7.5 mg q4h prn has definitely improved her abd pain.  06-05-2023 pain controlled with oxycodone 7.5 mg q4h prn and fentanyl patch 12 mcg/hr.

## 2023-06-03 NOTE — Assessment & Plan Note (Addendum)
06-03-2023 chronic.

## 2023-06-03 NOTE — Assessment & Plan Note (Addendum)
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 S/p G tube placement on 05-25-2023. 06-03-2023 continue with tube feeds 06-04-2023 tolerating overnight tube feeds. Osmolite 1.5 @ 75 ml/hr for 8 hours(total of 600 ml). 60 ml of free H2O five times a day.  06-05-2023 stable. BUN 13, Scr 0.47. continue current Rx of PEG feeds and free water.

## 2023-06-03 NOTE — Assessment & Plan Note (Signed)
06-03-2023 stable. Euvolemic. 06-04-2023 stable.

## 2023-06-03 NOTE — Subjective & Objective (Signed)
Pt seen and examined. Pt still smiling today. Pain controlled on 7.5 mg prn oxycodone. Tolerating overnight feedings. No vomiting.

## 2023-06-04 DIAGNOSIS — E440 Moderate protein-calorie malnutrition: Secondary | ICD-10-CM | POA: Diagnosis not present

## 2023-06-04 DIAGNOSIS — R1310 Dysphagia, unspecified: Secondary | ICD-10-CM | POA: Diagnosis not present

## 2023-06-04 DIAGNOSIS — Z86711 Personal history of pulmonary embolism: Secondary | ICD-10-CM | POA: Diagnosis not present

## 2023-06-04 DIAGNOSIS — R52 Pain, unspecified: Secondary | ICD-10-CM | POA: Diagnosis not present

## 2023-06-04 LAB — GLUCOSE, CAPILLARY
Glucose-Capillary: 121 mg/dL — ABNORMAL HIGH (ref 70–99)
Glucose-Capillary: 134 mg/dL — ABNORMAL HIGH (ref 70–99)
Glucose-Capillary: 89 mg/dL (ref 70–99)
Glucose-Capillary: 95 mg/dL (ref 70–99)
Glucose-Capillary: 101 mg/dL — ABNORMAL HIGH (ref 70–99)
Glucose-Capillary: 107 mg/dL — ABNORMAL HIGH (ref 70–99)
Glucose-Capillary: 133 mg/dL — ABNORMAL HIGH (ref 70–99)

## 2023-06-04 NOTE — Plan of Care (Signed)
  Problem: Clinical Measurements: Goal: Will remain free from infection Outcome: Progressing   Problem: Clinical Measurements: Goal: Diagnostic test results will improve Outcome: Progressing   Problem: Clinical Measurements: Goal: Respiratory complications will improve Outcome: Progressing   Problem: Activity: Goal: Risk for activity intolerance will decrease Outcome: Progressing   

## 2023-06-04 NOTE — Plan of Care (Signed)
  Problem: Education: Goal: Knowledge of General Education information will improve Description: Including pain rating scale, medication(s)/side effects and non-pharmacologic comfort measures Outcome: Progressing   Problem: Health Behavior/Discharge Planning: Goal: Ability to manage health-related needs will improve Outcome: Progressing   Problem: Clinical Measurements: Goal: Ability to maintain clinical measurements within normal limits will improve Outcome: Progressing Goal: Will remain free from infection Outcome: Progressing Goal: Diagnostic test results will improve Outcome: Progressing Goal: Cardiovascular complication will be avoided Outcome: Progressing   Problem: Activity: Goal: Risk for activity intolerance will decrease Outcome: Progressing   Problem: Nutrition: Goal: Adequate nutrition will be maintained Outcome: Progressing   Problem: Elimination: Goal: Will not experience complications related to urinary retention Outcome: Progressing   Problem: Pain Managment: Goal: General experience of comfort will improve and/or be controlled Outcome: Progressing   Problem: Safety: Goal: Ability to remain free from injury will improve Outcome: Progressing   Problem: Skin Integrity: Goal: Risk for impaired skin integrity will decrease Outcome: Progressing

## 2023-06-04 NOTE — TOC Progression Note (Signed)
Transition of Care [facility]) - Progression Note    Patient Details  Name: Denise Watts MRN: [id number] Date of Birth: Jan 01, 1956  [facility]) CM/SW Contact  Liliana Cline, LCSW Phone Number: 06/04/2023, 11:45 AM  Clinical Narrative:    Berkley Harvey started in [facility].   Expected Discharge Plan: Skilled Nursing Facility Barriers to Discharge: SNF Pending bed offer  Expected Discharge Plan and Services In-house Referral: Clinical Social Work   Post Acute Care Choice: Skilled Nursing Facility Living arrangements for the past 2 months: Single Family Home                                       Social Determinants of Health (SDOH) Interventions SDOH Screenings   Food Insecurity: No Food Insecurity (05/24/2023)  Housing: Low Risk  (05/24/2023)  Transportation Needs: No Transportation Needs (05/24/2023)  Utilities: Not At Risk (05/24/2023)  Alcohol Screen: Low Risk  (05/19/2023)  Depression (PHQ2-9): Low Risk  (05/19/2023)  Recent Concern: Depression (PHQ2-9) - High Risk (03/10/2023)  Financial Resource Strain: Low Risk  (05/19/2023)  Physical Activity: Inactive (05/19/2023)  Social Connections: Moderately Isolated (05/24/2023)  Stress: Stress Concern Present (05/19/2023)  Tobacco Use: Medium Risk (05/25/2023)  Health Literacy: Adequate Health Literacy (05/19/2023)    Readmission Risk Interventions     No data to display

## 2023-06-04 NOTE — Progress Notes (Signed)
PROGRESS NOTE    Denise Watts  BJY:[id number] DOB: 16-Jan-1956 DOA: 05/24/2023 PCP: Sherlyn Hay, DO  Subjective: Pt seen and examined. Pt has smile on her face today. Increase dose of oxycodone to 7.5 mg has helped a lot with her pain.  Tolerating overnight feedings. No vomiting.   Hospital Course: Hospital course / significant events:   HPI:  Denise Watts is a 68 y.o. female with medical history significant of  multiple cancer including vocal cord squamous cell carcinoma, esophageal stricture, breast cancer, adrenal carcinoma, non-small cell lung cancer (currently is on chemotherapy for lung cancer), HLD, COPD, CAD, dCHF, depression with anxiety, A-fib,. PE and DVT on Lovenox, mycoplasma pneumonia, who presents with dysphagia. Pt has hx of vocal cord squamous cell carcinoma and esophageal stricture.  States that her difficulty swallowing has been progressively worsening recently. She feels okay for swallowing liquids and water, but not solid food. She is direct admit w/ plan for feeding tube placement.   01/22: admitted to hospitalist service w/ general surgery consult 01/23: placement G-tube, plan for tube feeds tomorrow, heparin gtt tomorrow (recent hx PE) 01/24: ok to use G-tube, continue heparin today and start lovenox tomorrow. If tolerating tube feeds expect discharge tomorrow AM  01/25: significant abd pain / distension this morning. KUB/CT notable for intraluminal gas, some peritoneal air c/w surgery, no obvious obstruction. Concern for ileus. Gtube to gravity. General surgery following. Question aspiration but CXR appears normal except known nodules, abn lung sounds seem related to bronchial, no desaturation, suspect possible aspiration pneumonitis  01/26: Per surgery, ok to restart tube feeds slowly, reengaged w/ RD to assist. Pt still uncomfortable and hasn't had BM, trial suppository/enema, will see if tolerates tube feeds before giving enteral laxatives. Pt encouraged to get  OOB to chair / walk if able but she states she is probably not able d/t pain. Will repeat KUB. Will also repeat CXR, concern possible aspiration given coughing, no wheezing, lungs sound better today overall  01/27: finally had BM today, increasing tube feeds later today/tomorrow and work on ambulating.  Patient reports feeling significantly better after BM 01/28: We are hopeful for DC today however recurrent severe abdominal pain, KUB repeat, appears similar to 01/26.  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 S/p G tube placement on 05-25-2023. 06-03-2023 continue with tube feeds  06-04-2023 tolerating overnight tube feeds. Osmolite 1.5 @ 75 ml/hr for 8 hours(total of 600 ml). 60 ml of free H2O five times a day.  Uncontrolled pain 06-03-2023 on fentanyl patch. Pt with multiple cancers.  On oxycodone 5 mg prn. Will increase to 7.5 mg prn to help with pain.  06-04-2023 continue fentanyl patch 12 mcg/hr. Oxycodone 7.5 mg q4h prn has definitely improved her abd pain.  Protein-calorie malnutrition, moderate (HCC) 06-03-2023 s/p PEG 06-04-2023 Nutrition Status: Nutrition Problem: Moderate Malnutrition Etiology: chronic illness (vocal cord squamous cell carcinoma, COPD, CHF) Signs/Symptoms: mild fat depletion, moderate fat depletion, mild muscle depletion, moderate muscle depletion Interventions: Tube feeding    Hx of pulmonary embolus 06-04-2023 Continue with lovenox bid.  CAD (coronary artery disease) 06-03-2023 stable. On statin.  06-04-2023 stable.  Depression with anxiety 06-03-2023 chronic. 06-04-2023 stable.  Chronic diastolic CHF (congestive heart failure) (HCC) 06-03-2023 stable. Euvolemic. 06-04-2023  stable.  COPD (chronic obstructive pulmonary disease) (HCC) 06-03-2023 stable. 06-04-2023 stable.  DVT (deep venous thrombosis) (HCC) 06-04-2023 Continue with lovenox BID.  Non-small cell lung cancer (HCC) 06-03-2023 follows with oncology. 06-04-2023 pt will f/u with oncology.  PAF (paroxysmal atrial fibrillation) (HCC) 06-03-2023 continue with toprol-xl 12.5 mg daily. Remains on lovenox bid.  06-04-2023 stable.  HLD (hyperlipidemia) 06-03-2023 on crestor  06-04-2023 stable.  DVT prophylaxis:   Lovenox   Code Status: Full Code Family Communication: no family at bedside Disposition Plan: SNF Reason for continuing need for hospitalization: medically stable for DC  Objective: Vitals:   06/03/23 1445 06/03/23 2023 06/04/23 0342 06/04/23 0742  BP: 113/73 107/65 (!) 101/58 101/61  Pulse: 94 87 84 78  Resp: 16 16 16 16   Temp: 98.5 F (36.9 C) 98.3 F (36.8 C) 98.4 F (36.9 C) 97.7 F (36.5 C)  TempSrc: Oral Oral Axillary Oral  SpO2: 97% 96% 98% 100%  Weight:   41.5 kg 48.9 kg  Height:        Intake/Output Summary (Last 24 hours) at 06/04/2023 1321 Last data filed at 06/04/2023 0403 Gross per 24 hour  Intake 646.25 ml  Output 900 ml  Net -253.75 ml   Filed Weights   06/01/23 0744 06/04/23 0342 06/04/23 0742  Weight: 51.8 kg 41.5 kg 48.9 kg    Examination:  Physical Exam Vitals and nursing note reviewed.  Constitutional:      General: She is not in acute distress.    Appearance: She is not toxic-appearing.     Comments: Appears chronically ill Has a smile on her face today.  HENT:     Head: Normocephalic and atraumatic.  Eyes:     General: No scleral icterus. Cardiovascular:     Rate and Rhythm: Normal rate and regular rhythm.  Pulmonary:     Effort: Pulmonary effort is normal. No respiratory distress.  Abdominal:     General: Bowel sounds are normal.     Comments: +PEG  Musculoskeletal:     Right lower leg: No edema.     Left lower leg: No edema.   Skin:    General: Skin is warm and dry.  Neurological:     Mental Status: She is alert and oriented to person, place, and time.     Data Reviewed: I have personally reviewed following labs and imaging studies  CBC: Recent Labs  Lab 05/29/23 0634 05/31/23 0456  WBC 3.8* 3.9*  HGB 10.5* 10.0*  HCT 33.4* 31.3*  MCV 104.0* 103.6*  PLT 189 197   Basic Metabolic Panel: Recent Labs  Lab 05/29/23 0634 05/31/23 0456 06/01/23 0515 06/02/23 0510  NA 140 140  --  141  K 3.7 4.3  --  4.3  CL 106 102  --  104  CO2 28 28  --  28  GLUCOSE 124* 103*  --  93  BUN 7* 10  --  12  CREATININE 0.40* 0.43*  --  0.46  CALCIUM 8.1* 8.4*  --  8.5*  MG  --   --  2.3  --   PHOS  --   --  4.1  --    GFR: Estimated Creatinine Clearance: 50.1 mL/min (by C-G formula based on SCr of 0.46 mg/dL).  BNP (last 3 results) Recent Labs    05/10/23 1228 05/24/23 1903  BNP 124.3* 73.7   CBG: Recent Labs  Lab 06/03/23 2028 06/04/23 0151 06/04/23 0511 06/04/23 0803 06/04/23 1211  GLUCAP 91 121* 134* 89 95  Scheduled Meds:  Chlorhexidine Gluconate Cloth  6 each Topical Q0600   enoxaparin (LOVENOX) injection  50 mg Subcutaneous Q12H   fentaNYL  1 patch Transdermal Q72H   free water  60 mL Per Tube 5 X Daily   lactulose  20 g Per Tube BID   metoprolol succinate  12.5 mg Oral Daily   multivitamin  15 mL Oral Daily   omeprazole  40 mg Oral Daily   mouth rinse  15 mL Mouth Rinse 4 times per day   rOPINIRole  0.25 mg Oral QHS   rosuvastatin  40 mg Oral Daily   senna  1 tablet Per Tube Daily   simethicone  80 mg Per Tube QID   sodium chloride flush  10-40 mL Intracatheter Q12H   Continuous Infusions:  feeding supplement (OSMOLITE 1.5 CAL) Stopped (06/03/23 0520)     LOS: 11 days   Time spent: 40 minutes  Carollee Herter, DO  [facility]  06/04/2023, 1:21 PM

## 2023-06-05 ENCOUNTER — Ambulatory Visit: Payer: Medicare HMO

## 2023-06-05 ENCOUNTER — Inpatient Hospital Stay: Payer: Medicare HMO

## 2023-06-05 ENCOUNTER — Inpatient Hospital Stay: Payer: Medicare HMO | Admitting: Oncology

## 2023-06-05 ENCOUNTER — Other Ambulatory Visit: Payer: Medicare HMO

## 2023-06-05 DIAGNOSIS — I480 Paroxysmal atrial fibrillation: Secondary | ICD-10-CM | POA: Diagnosis not present

## 2023-06-05 DIAGNOSIS — R1310 Dysphagia, unspecified: Secondary | ICD-10-CM | POA: Diagnosis not present

## 2023-06-05 DIAGNOSIS — I5032 Chronic diastolic (congestive) heart failure: Secondary | ICD-10-CM | POA: Diagnosis not present

## 2023-06-05 DIAGNOSIS — Z86718 Personal history of other venous thrombosis and embolism: Secondary | ICD-10-CM | POA: Diagnosis not present

## 2023-06-05 DIAGNOSIS — E440 Moderate protein-calorie malnutrition: Secondary | ICD-10-CM | POA: Diagnosis not present

## 2023-06-05 DIAGNOSIS — R079 Chest pain, unspecified: Secondary | ICD-10-CM | POA: Diagnosis not present

## 2023-06-05 DIAGNOSIS — C328 Malignant neoplasm of overlapping sites of larynx: Secondary | ICD-10-CM | POA: Diagnosis not present

## 2023-06-05 DIAGNOSIS — R52 Pain, unspecified: Secondary | ICD-10-CM | POA: Diagnosis not present

## 2023-06-05 DIAGNOSIS — R109 Unspecified abdominal pain: Secondary | ICD-10-CM | POA: Diagnosis not present

## 2023-06-05 DIAGNOSIS — C3490 Malignant neoplasm of unspecified part of unspecified bronchus or lung: Secondary | ICD-10-CM | POA: Diagnosis not present

## 2023-06-05 DIAGNOSIS — R1314 Dysphagia, pharyngoesophageal phase: Secondary | ICD-10-CM | POA: Diagnosis not present

## 2023-06-05 DIAGNOSIS — E43 Unspecified severe protein-calorie malnutrition: Secondary | ICD-10-CM | POA: Diagnosis not present

## 2023-06-05 DIAGNOSIS — Z978 Presence of other specified devices: Secondary | ICD-10-CM | POA: Diagnosis not present

## 2023-06-05 DIAGNOSIS — Z86711 Personal history of pulmonary embolism: Secondary | ICD-10-CM | POA: Diagnosis not present

## 2023-06-05 DIAGNOSIS — W19XXXA Unspecified fall, initial encounter: Secondary | ICD-10-CM | POA: Diagnosis not present

## 2023-06-05 DIAGNOSIS — R1012 Left upper quadrant pain: Secondary | ICD-10-CM | POA: Diagnosis not present

## 2023-06-05 LAB — COMPREHENSIVE METABOLIC PANEL
ALT: 13 U/L (ref 0–44)
AST: 25 U/L (ref 15–41)
Albumin: 2.6 g/dL — ABNORMAL LOW (ref 3.5–5.0)
Alkaline Phosphatase: 49 U/L (ref 38–126)
Anion gap: 8 (ref 5–15)
BUN: 13 mg/dL (ref 8–23)
CO2: 30 mmol/L (ref 22–32)
Calcium: 8.5 mg/dL — ABNORMAL LOW (ref 8.9–10.3)
Chloride: 103 mmol/L (ref 98–111)
Creatinine, Ser: 0.47 mg/dL (ref 0.44–1.00)
GFR, Estimated: >60 mL/min (ref >60)
Glucose, Bld: 99 mg/dL (ref 70–99)
Potassium: 4.7 mmol/L (ref 3.5–5.1)
Sodium: 141 mmol/L (ref 135–145)
Total Bilirubin: 0.5 mg/dL (ref 0.0–1.2)
Total Protein: 5.2 g/dL — ABNORMAL LOW (ref 6.5–8.1)

## 2023-06-05 LAB — CBC WITH DIFFERENTIAL/PLATELET
Abs Immature Granulocytes: 0.06 10*3/uL (ref 0.00–0.07)
Basophils Absolute: 0 10*3/uL (ref 0.0–0.1)
Basophils Relative: 1 %
Eosinophils Absolute: 0.3 10*3/uL (ref 0.0–0.5)
Eosinophils Relative: 6 %
HCT: 32.9 % — ABNORMAL LOW (ref 36.0–46.0)
Hemoglobin: 10.2 g/dL — ABNORMAL LOW (ref 12.0–15.0)
Immature Granulocytes: 1 %
Lymphocytes Relative: 13 %
Lymphs Abs: 0.6 10*3/uL — ABNORMAL LOW (ref 0.7–4.0)
MCH: 32.4 pg (ref 26.0–34.0)
MCHC: 31 g/dL (ref 30.0–36.0)
MCV: 104.4 fL — ABNORMAL HIGH (ref 80.0–100.0)
Monocytes Absolute: 0.7 10*3/uL (ref 0.1–1.0)
Monocytes Relative: 14 %
Neutro Abs: 3.2 10*3/uL (ref 1.7–7.7)
Neutrophils Relative %: 65 %
Platelets: 205 10*3/uL (ref 150–400)
RBC: 3.15 MIL/uL — ABNORMAL LOW (ref 3.87–5.11)
RDW: 15.5 % (ref 11.5–15.5)
WBC: 4.9 10*3/uL (ref 4.0–10.5)
nRBC: 0 % (ref 0.0–0.2)

## 2023-06-05 LAB — GLUCOSE, CAPILLARY
Glucose-Capillary: 145 mg/dL — ABNORMAL HIGH (ref 70–99)
Glucose-Capillary: 129 mg/dL — ABNORMAL HIGH (ref 70–99)

## 2023-06-05 LAB — PREALBUMIN: Prealbumin: 20 mg/dL (ref 18–38)

## 2023-06-05 MED ORDER — ROSUVASTATIN CALCIUM 40 MG PO TABS: 40.0000 mg | ORAL_TABLET | Freq: Every day | ORAL | Status: DC

## 2023-06-05 MED ORDER — HEPARIN SOD (PORK) LOCK FLUSH 100 UNIT/ML IV SOLN
500.0000 [IU] | INTRAVENOUS | Status: AC | PRN
  Administered 2023-06-05: 500 [IU]

## 2023-06-05 MED ORDER — FENTANYL 12 MCG/HR TD PT72: 1.0000 | MEDICATED_PATCH | TRANSDERMAL | 0 refills | Status: DC

## 2023-06-05 MED ORDER — ONDANSETRON HCL 4 MG PO TABS: 4.0000 mg | ORAL_TABLET | Freq: Four times a day (QID) | ORAL | Status: DC | PRN

## 2023-06-05 MED ORDER — METOPROLOL SUCCINATE ER 25 MG PO TB24: 25.0000 mg | ORAL_TABLET | Freq: Every day | ORAL | Status: DC

## 2023-06-05 MED ORDER — SIMETHICONE 80 MG PO CHEW: 80.0000 mg | CHEWABLE_TABLET | Freq: Four times a day (QID) | ORAL | Status: DC

## 2023-06-05 MED ORDER — FREE WATER: 60.0000 mL | Freq: Every day | Status: AC

## 2023-06-05 MED ORDER — DM-GUAIFENESIN ER 30-600 MG PO TB12: 1.0000 | ORAL_TABLET | Freq: Two times a day (BID) | ORAL | Status: DC | PRN

## 2023-06-05 MED ORDER — BENZONATATE 100 MG PO CAPS: 200.0000 mg | ORAL_CAPSULE | Freq: Three times a day (TID) | ORAL | Status: AC | PRN

## 2023-06-05 MED ORDER — SENNA 8.6 MG PO TABS: 1.0000 | ORAL_TABLET | Freq: Every day | ORAL | Status: AC

## 2023-06-05 MED ORDER — BISACODYL 10 MG RE SUPP: 10.0000 mg | Freq: Every day | RECTAL | Status: AC | PRN

## 2023-06-05 MED ORDER — APIXABAN 5 MG PO TABS
5.0000 mg | ORAL_TABLET | Freq: Two times a day (BID) | ORAL | Status: DC
Start: 2023-06-05 — End: 2023-06-05

## 2023-06-05 MED ORDER — OXYCODONE HCL 7.5 MG PO TABS: 7.5000 mg | ORAL_TABLET | ORAL | 0 refills | Status: DC | PRN

## 2023-06-05 MED ORDER — OSMOLITE 1.5 CAL PO LIQD: 600.0000 mL | ORAL | Status: DC

## 2023-06-05 MED ORDER — LACTULOSE 10 GM/15ML PO SOLN: 20.0000 g | Freq: Two times a day (BID) | ORAL | Status: DC

## 2023-06-05 MED ORDER — APIXABAN 5 MG PO TABS: 5.0000 mg | ORAL_TABLET | Freq: Two times a day (BID) | ORAL | Status: DC

## 2023-06-05 MED ORDER — ROPINIROLE HCL 0.25 MG PO TABS: 0.2500 mg | ORAL_TABLET | Freq: Every day | ORAL | Status: DC

## 2023-06-05 NOTE — Care Management Important Message (Signed)
Important Message  Patient Details  Name: Denise Watts MRN: [id number] Date of Birth: 07/30/1955   Important Message Given:  Yes - Medicare IM     Sherilyn Banker 06/05/2023, 1:52 PM

## 2023-06-05 NOTE — Discharge Summary (Signed)
[facility] Physician Discharge Summary   Patient name: Denise Watts  Admit date:     05/24/2023  Discharge date: 06/05/2023  Attending Physician: Lorretta Harp [[id number]]  Discharge Physician: Carollee Herter   PCP: Sherlyn Hay, DO  Admitted From: Home  Disposition:   [facility]  Recommendations for Outpatient Follow-up:  Follow up with PCP in 1-2 weeks Follow up with oncology in 2-4 weeks  Home Health:No Equipment/Devices: None  Discharge Condition:Stable CODE STATUS:FULL Diet recommendation: Dysphagia. Mechanical soft diet with thin liquids Fluid Restriction: None  Hospital Summary: Hospital course / significant events:   HPI:  Denise Watts is a 68 y.o. female with medical history significant of  multiple cancer including vocal cord squamous cell carcinoma, esophageal stricture, breast cancer, adrenal carcinoma, non-small cell lung cancer (currently is on chemotherapy for lung cancer), HLD, COPD, CAD, dCHF, depression with anxiety, A-fib,. PE and DVT on Lovenox, mycoplasma pneumonia, who presents with dysphagia. Pt has hx of vocal cord squamous cell carcinoma and esophageal stricture.  States that her difficulty swallowing has been progressively worsening recently. She feels okay for swallowing liquids and water, but not solid food. She is direct admit w/ plan for feeding tube placement.   01/22: admitted to hospitalist service w/ general surgery consult 01/23: placement G-tube, plan for tube feeds tomorrow, heparin gtt tomorrow (recent hx PE) 01/24: ok to use G-tube, continue heparin today and start lovenox tomorrow. If tolerating tube feeds expect discharge tomorrow AM  01/25: significant abd pain / distension this morning. KUB/CT notable for intraluminal gas, some peritoneal air c/w surgery, no obvious obstruction. Concern for ileus. Gtube to gravity. General surgery following. Question aspiration but CXR appears normal except known nodules, abn lung sounds seem related  to bronchial, no desaturation, suspect possible aspiration pneumonitis  01/26: Per surgery, ok to restart tube feeds slowly, reengaged w/ RD to assist. Pt still uncomfortable and hasn't had BM, trial suppository/enema, will see if tolerates tube feeds before giving enteral laxatives. Pt encouraged to get OOB to chair / walk if able but she states she is probably not able d/t pain. Will repeat KUB. Will also repeat CXR, concern possible aspiration given coughing, no wheezing, lungs sound better today overall  01/27: finally had BM today, increasing tube feeds later today/tomorrow and work on ambulating.  Patient reports feeling significantly better after BM 01/28: We are hopeful for DC today however recurrent severe abdominal pain, KUB repeat, appears similar to 01/26.  Repeat suppository, if/when BM, continue bowel regimen and patient really needs to ambulate 06-05-2023 Discussed with oncology. Pt can go on Eliquis. She has a PEG now. Was only on lovenox because she could not swallow Eliquis pill.   Consultants:  General surgery   Procedures/Surgeries: 05/25/23 G tube placed - [facility] Course by Problem: * Dysphagia Dysphagia due to esophageal stricture s/p radiation for vocal cord squamous cell carcinoma. S/p G tube placement on 05-25-2023. 06-03-2023 continue with tube feeds 06-04-2023 tolerating overnight tube feeds. Osmolite 1.5 @ 75 ml/hr for 8 hours(total of 600 ml). 60 ml of free H2O five times a day.  06-05-2023 stable. BUN 13, Scr 0.47. continue current Rx of PEG feeds and free water.  Uncontrolled pain 06-03-2023 on fentanyl patch. Pt with multiple cancers.  On oxycodone 5 mg prn. Will increase to 7.5 mg prn to help with pain. 06-04-2023 continue fentanyl patch 12 mcg/hr. Oxycodone 7.5 mg q4h prn has definitely improved her abd pain.  06-05-2023 pain controlled  with oxycodone 7.5 mg q4h prn and fentanyl patch 12 mcg/hr.  Protein-calorie malnutrition, moderate  (HCC) 06-03-2023 s/p PEG 06-04-2023 Nutrition Status: Nutrition Problem: Moderate Malnutrition Etiology: chronic illness (vocal cord squamous cell carcinoma, COPD, CHF) Signs/Symptoms: mild fat depletion, moderate fat depletion, mild muscle depletion, moderate muscle depletion Interventions: Tube feeding    Hx of pulmonary embolus 06-04-2023 Continue with lovenox bid.  06-05-2023 Discussed with oncology. Pt can go on Eliquis. She has a PEG now. Was only on lovenox because she could not swallow Eliquis pill.   CAD (coronary artery disease) 06-03-2023 stable. On statin.  06-04-2023 stable.  Depression with anxiety 06-03-2023 chronic. 06-04-2023 stable.  Chronic diastolic CHF (congestive heart failure) (HCC) 06-03-2023 stable. Euvolemic. 06-04-2023 stable.  COPD (chronic obstructive pulmonary disease) (HCC) 06-03-2023 stable. 06-04-2023 stable.  DVT (deep venous thrombosis) (HCC) 06-04-2023 Continue with lovenox BID. 06-05-2023 change to Eliquis 5 mg bid. Discussed with oncology. Pt can go on Eliquis. She has a PEG now. Was only on lovenox because she could not swallow Eliquis pill.   Non-small cell lung cancer (HCC) 06-03-2023 follows with oncology. 06-04-2023 pt will f/u with oncology.  06-05-2023 stable. F/u with oncology.  PAF (paroxysmal atrial fibrillation) (HCC) 06-03-2023 continue with toprol-xl 12.5 mg daily. Remains on lovenox bid. 06-04-2023 stable. 06-05-2023 stable. Continue Toprol-xl 12.5 mg daily. Discussed with oncology. Pt can go on Eliquis. She has a PEG now. Was only on lovenox because she could not swallow Eliquis pill.  HLD (hyperlipidemia) 06-03-2023 on crestor 06-04-2023 stable.  06-05-2023 stable    Discharge Diagnoses:  Principal Problem:   Dysphagia Active Problems:   Uncontrolled pain   HLD (hyperlipidemia)   PAF (paroxysmal atrial fibrillation) (HCC)   Non-small cell lung cancer (HCC)   DVT (deep venous thrombosis) (HCC)   COPD  (chronic obstructive pulmonary disease) (HCC)   Chronic diastolic CHF (congestive heart failure) (HCC)   Depression with anxiety   CAD (coronary artery disease)   Hx of pulmonary embolus   Protein-calorie malnutrition, moderate (HCC)   Discharge Instructions  Discharge Instructions     Call MD for:  difficulty breathing, headache or visual disturbances   Complete by: As directed    Call MD for:  extreme fatigue   Complete by: As directed    Call MD for:  hives   Complete by: As directed    Call MD for:  persistant dizziness or light-headedness   Complete by: As directed    Call MD for:  persistant nausea and vomiting   Complete by: As directed    Call MD for:  redness, tenderness, or signs of infection (pain, swelling, redness, odor or green/yellow discharge around incision site)   Complete by: As directed    Call MD for:  severe uncontrolled pain   Complete by: As directed    Call MD for:  temperature >100.4   Complete by: As directed    DIET DYS 3   Complete by: As directed    Mechanical soft diet with thin liquids   Fluid consistency: Thin   Discharge instructions   Complete by: As directed    1. Follow up with oncology as scheduled   Increase activity slowly   Complete by: As directed       Allergies as of 06/05/2023       Reactions   Sulfa Antibiotics Other (See Comments)   Mouth sore and raw        Medication List     STOP taking these medications  DULoxetine 60 MG capsule Commonly known as: CYMBALTA   enoxaparin 60 MG/0.6ML injection Commonly known as: LOVENOX   fluconazole 10 MG/ML suspension Commonly known as: DIFLUCAN   ondansetron 4 MG disintegrating tablet Commonly known as: ZOFRAN-ODT   oxyCODONE 5 MG/5ML solution Commonly known as: ROXICODONE Replaced by: oxyCODONE HCl 7.5 MG Tabs   oxyCODONE-acetaminophen 5-325 MG tablet Commonly known as: PERCOCET/ROXICET   tolterodine 4 MG 24 hr capsule Commonly known as: DETROL LA        TAKE these medications    apixaban 5 MG Tabs tablet Commonly known as: Eliquis Take 1 tablet (5 mg total) by mouth 2 (two) times daily.   benzonatate 100 MG capsule Commonly known as: TESSALON Take 2 capsules (200 mg total) by mouth 3 (three) times daily as needed for cough. What changed: when to take this   bisacodyl 10 MG suppository Commonly known as: DULCOLAX Place 1 suppository (10 mg total) rectally daily as needed for moderate constipation or severe constipation.   dextromethorphan-guaiFENesin 30-600 MG 12hr tablet Commonly known as: MUCINEX DM Take 1 tablet by mouth 2 (two) times daily as needed for cough.   feeding supplement (OSMOLITE 1.5 CAL) Liqd Place 600 mLs into feeding tube continuous. Nocturnal feedings: Osmolite 1.5 at 75 ml/hr over 8 hour period (2000-0400)   fentaNYL 12 MCG/HR Commonly known as: DURAGESIC Place 1 patch onto the skin every 3 (three) days.   free water Soln Place 60 mLs into feeding tube 5 (five) times daily.   lactulose 10 GM/15ML solution Commonly known as: CHRONULAC Place 30 mLs (20 g total) into feeding tube 2 (two) times daily.   lidocaine 5 % Commonly known as: LIDODERM Place 1 patch onto the skin daily. Remove & Discard patch within 12 hours or as directed by MD   metoprolol succinate 25 MG 24 hr tablet Commonly known as: TOPROL-XL Take 1 tablet (25 mg total) by mouth daily.   omeprazole 2 mg/mL Susp oral suspension Commonly known as: KONVOMEP Take 20 mLs (40 mg total) by mouth daily.   ondansetron 4 MG tablet Commonly known as: Zofran Take 1 tablet (4 mg total) by mouth every 6 (six) hours as needed for nausea or vomiting.   oxyCODONE HCl 7.5 MG Tabs Place 7.5 mg into feeding tube every 4 (four) hours as needed for severe pain (pain score 7-10) or moderate pain (pain score 4-6). Replaces: oxyCODONE 5 MG/5ML solution   rOPINIRole 0.25 MG tablet Commonly known as: REQUIP Place 1 tablet (0.25 mg total) into feeding  tube at bedtime. What changed:  how much to take how to take this when to take this   rosuvastatin 40 MG tablet Commonly known as: CRESTOR Place 1 tablet (40 mg total) into feeding tube daily. What changed: how to take this   senna 8.6 MG Tabs tablet Commonly known as: SENOKOT Place 1 tablet (8.6 mg total) into feeding tube daily. Start taking on: June 06, 2023   simethicone 80 MG chewable tablet Commonly known as: MYLICON Place 1 tablet (80 mg total) into feeding tube 4 (four) times daily.   sucralfate 1 GM/10ML suspension Commonly known as: CARAFATE Take 10 mLs by mouth 4 (four) times daily.   triamcinolone cream 0.1 % Commonly known as: KENALOG Apply 1 Application topically 2 (two) times daily.               Durable Medical Equipment  (From admission, onward)           Start  Ordered   05/26/23 1121  For home use only DME Tube feeding  Once       Comments: Home tube feeding regimen:  Bolus feedings:  1 carton (250 ml) Nutren 1.5 5 times daily  30 ml free water flush before and after each feeding administration  Tube feeding regimen provides 1900 kcals, 85 grams of protein, and 1285 ml of H2O.   05/26/23 1121            Follow-up Information     Pabon, Merri Ray, MD Follow up on 06/05/2023.   Specialty: General Surgery Contact information: [address] [place] [address] [phone]         Sherlyn Hay, DO Follow up.   Specialty: Family Medicine Why: Hospital follow up Contact information: [address] [place] [address] [phone]                Allergies  Allergen Reactions   Sulfa Antibiotics Other (See Comments)    Mouth sore and raw    Discharge Exam: Vitals:   06/05/23 0413 06/05/23 0739  BP: (!) 98/52 106/68  Pulse: 85 75  Resp: 20 18  Temp: 97.8 F (36.6 C) 97.7 F (36.5 C)  SpO2: 98% 98%    Physical Exam Vitals and nursing note reviewed.   Constitutional:      General: She is not in acute distress.    Appearance: She is not toxic-appearing or diaphoretic.  HENT:     Head: Normocephalic and atraumatic.     Nose: Nose normal.  Eyes:     General: No scleral icterus. Cardiovascular:     Rate and Rhythm: Normal rate and regular rhythm.  Pulmonary:     Effort: Pulmonary effort is normal.     Breath sounds: Normal breath sounds.  Abdominal:     General: Bowel sounds are normal. There is no distension.     Tenderness: There is no abdominal tenderness.     Comments: +PEG  Skin:    General: Skin is warm and dry.     Capillary Refill: Capillary refill takes less than 2 seconds.  Neurological:     Mental Status: She is alert and oriented to person, place, and time.     The results of significant diagnostics from this hospitalization (including imaging, microbiology, ancillary and laboratory) are listed below for reference.    Microbiology: No results found for this or any previous visit (from the past 240 hours).   Labs: BNP (last 3 results) Recent Labs    05/10/23 1228 05/24/23 1903  BNP 124.3* 73.7   Basic Metabolic Panel: Recent Labs  Lab 05/31/23 0456 06/01/23 0515 06/02/23 0510 06/05/23 0730  NA 140  --  141 141  K 4.3  --  4.3 4.7  CL 102  --  104 103  CO2 28  --  28 30  GLUCOSE 103*  --  93 99  BUN 10  --  12 13  CREATININE 0.43*  --  0.46 0.47  CALCIUM 8.4*  --  8.5* 8.5*  MG  --  2.3  --   --   PHOS  --  4.1  --   --    Liver Function Tests: Recent Labs  Lab 06/05/23 0730  AST 25  ALT 13  ALKPHOS 49  BILITOT 0.5  PROT 5.2*  ALBUMIN 2.6*   CBC: Recent Labs  Lab 05/31/23 0456 06/05/23 0730  WBC 3.9* 4.9  NEUTROABS  --  3.2  HGB 10.0* 10.2*  HCT 31.3* 32.9*  MCV 103.6* 104.4*  PLT 197 205   CBG: Recent Labs  Lab 06/04/23 1557 06/04/23 2039 06/04/23 2321 06/05/23 0417 06/05/23 1156  GLUCAP 101* 107* 133* 145* 129*   Urinalysis    Component Value Date/Time    COLORURINE YELLOW (A) 05/10/2023 1629   APPEARANCEUR CLEAR (A) 05/10/2023 1629   LABSPEC 1.013 05/10/2023 1629   PHURINE 5.0 05/10/2023 1629   GLUCOSEU 50 (A) 05/10/2023 1629   HGBUR NEGATIVE 05/10/2023 1629   BILIRUBINUR NEGATIVE 05/10/2023 1629   KETONESUR 5 (A) 05/10/2023 1629   PROTEINUR NEGATIVE 05/10/2023 1629   NITRITE NEGATIVE 05/10/2023 1629   LEUKOCYTESUR NEGATIVE 05/10/2023 1629   Sepsis Labs Recent Labs  Lab 05/31/23 0456 06/05/23 0730  WBC 3.9* 4.9   Procedures/Studies: DG Abd Portable 1V Result Date: 05/30/2023 CLINICAL DATA:  Abdominal pain EXAM: PORTABLE ABDOMEN - 1 VIEW COMPARISON:  05/28/2023 FINDINGS: PEG tube in LEFT upper quadrant. No dilated large or small bowel. Gas and stool in the rectum. IMPRESSION: No bowel obstruction. Electronically Signed   By: Genevive Bi M.D.   On: 05/30/2023 16:11   DG Chest Port 1 View Result Date: 05/28/2023 CLINICAL DATA:  Dysphagia, cough. EXAM: PORTABLE CHEST 1 VIEW COMPARISON:  Radiograph yesterday.  CT 05/10/2023 FINDINGS: Accessed right chest port in the SVC. Normal heart size with stable mediastinal contours. Scattered nodular densities are without significant interval change. No confluent airspace disease. No significant pleural effusion. No pneumothorax. IMPRESSION: 1. No acute chest findings. 2. Scattered nodular densities are without significant interval change. Electronically Signed   By: Narda Rutherford M.D.   On: 05/28/2023 18:57   DG Abd 1 View Result Date: 05/28/2023 CLINICAL DATA:  Dysphagia. EXAM: ABDOMEN - 1 VIEW COMPARISON:  CT yesterday FINDINGS: Gastrostomy tube in the upper abdomen. Scattered air throughout nondilated small bowel centrally, no evidence of obstruction. No significant formed stool in the colon. No radiopaque calculi. IMPRESSION: Nonobstructive bowel gas pattern. Electronically Signed   By: Narda Rutherford M.D.   On: 05/28/2023 18:55   CT ABDOMEN PELVIS WO CONTRAST Result Date:  05/27/2023 CLINICAL DATA:  Abdominal pain. Recent gastrostomy tube placement. Lung carcinoma. * Tracking Code: BO * EXAM: CT ABDOMEN AND PELVIS WITHOUT CONTRAST TECHNIQUE: Multidetector CT imaging of the abdomen and pelvis was performed following the standard protocol without IV contrast. RADIATION DOSE REDUCTION: This exam was performed according to the departmental dose-optimization program which includes automated exposure control, adjustment of the mA and/or kV according to patient size and/or use of iterative reconstruction technique. COMPARISON:  12/09/2021 FINDINGS: Lower chest: Tiny bilateral pleural effusions. Hepatobiliary: No mass visualized on this unenhanced exam. Gallbladder is unremarkable. No evidence of biliary ductal dilatation. Pancreas: No mass or inflammatory process visualized on this unenhanced exam. Spleen:  Within normal limits in size. Adrenals/Urinary tract: No evidence of urolithiasis or hydronephrosis. Unremarkable unopacified urinary bladder. Stomach/Bowel: Gastrostomy tube is seen appropriate position in the gastric body. Tiny amount of free intraperitoneal air as well as gas within the anterior abdominal wall adjacent to the gastrostomy is attributable to recent gastrostomy tube placement. No evidence of bowel obstruction, inflammatory process, or abnormal fluid collections. Diverticulosis is seen mainly involving the sigmoid colon, however there is no evidence of diverticulitis. Vascular/Lymphatic: No pathologically enlarged lymph nodes identified. No evidence of abdominal aortic aneurysm. Reproductive:  No mass or other significant abnormality. Other:  None. Musculoskeletal:  No suspicious bone lesions identified. IMPRESSION: Gastrostomy tube in appropriate position. Tiny  amount of free intraperitoneal air and gas within the anterior abdominal wall, attributable to recent gastrostomy tube placement. No other acute findings. Colonic diverticulosis, without radiographic evidence of  diverticulitis. Electronically Signed   By: Danae Orleans M.D.   On: 05/27/2023 13:19   DG Abd 1 View Result Date: 05/27/2023 CLINICAL DATA:  Abdominal pain and distension. EXAM: ABDOMEN - 1 VIEW COMPARISON:  01/25/2023 FINDINGS: Prominent gaseous distention of the stomach. Catheter overlies the left abdomen, potentially a gastrostomy tube. Gas dilated bowel loop overlying the right upper abdomen may reflect distended transverse colon. Diffuse gaseous small bowel distension noted. Gas and stool noted in the sigmoid colon with prominent gas in stool visible in the rectum. IMPRESSION: Prominent gaseous distention of the stomach with diffuse gaseous small bowel distension. Gas dilated bowel loop overlying the right upper abdomen may reflect distended transverse colon. Findings likely reflect ileus. CT imaging could be used to more definitively evaluate. Electronically Signed   By: Kennith Center M.D.   On: 05/27/2023 11:10   DG Chest Port 1 View Result Date: 05/27/2023 CLINICAL DATA:  Shortness of breath.  Abnormal lung sounds. EXAM: PORTABLE CHEST 1 VIEW COMPARISON:  05/10/2023 FINDINGS: Scattered bilateral pulmonary nodules again noted. No focal consolidation or pulmonary edema. No substantial pleural effusion. The cardiopericardial silhouette is within normal limits for size. Right Port-A-Cath remains in place. No acute bony abnormality. IMPRESSION: Scattered bilateral pulmonary nodules again noted. No acute cardiopulmonary findings. Electronically Signed   By: Kennith Center M.D.   On: 05/27/2023 11:06   PERIPHERAL VASCULAR CATHETERIZATION Result Date: 05/12/2023 See surgical note for result.  ECHOCARDIOGRAM COMPLETE Result Date: 05/12/2023    ECHOCARDIOGRAM REPORT   Patient Name:   SHAKITA KEIR Date of Exam: 05/11/2023 Medical Rec #:  [id number]    Height:       61.0 in Accession #:    [id number]   Weight:       114.6 lb Date of Birth:  [birth year]    BSA:          1.491 m Patient Age:    67 years     BP:            115/67 mmHg Patient Gender: F            HR:           92 bpm. Exam Location:  [facility] Procedure: 2D Echo, Cardiac Doppler and Color Doppler Indications:     Pulmonary embolus  History:         Patient has prior history of Echocardiogram examinations, most                  recent 01/27/2023. CHF, CAD, COPD, Arrythmias:Atrial                  Fibrillation and Atrial Flutter; Risk Factors:Dyslipidemia.                  Breast CA, CKD.  Sonographer:     Mikki Harbor Referring Phys:  [id number] Lorretta Harp Diagnosing Phys: Julien Nordmann MD  Sonographer Comments: Technically difficult study due to poor echo windows. Image acquisition challenging due to respiratory motion. IMPRESSIONS  1. Left ventricular ejection fraction, by estimation, is 60 to 65%. The left ventricle has normal function. The left ventricle has no regional wall motion abnormalities. There is mild left ventricular hypertrophy. Left ventricular diastolic parameters are consistent with Grade II diastolic dysfunction (pseudonormalization).  2. Right ventricular systolic function is normal.  The right ventricular size is mildly enlarged. There is normal pulmonary artery systolic pressure. The estimated right ventricular systolic pressure is 29.9 mmHg.  3. The mitral valve is normal in structure. Mild mitral valve regurgitation. No evidence of mitral stenosis.  4. Tricuspid valve regurgitation is mild to moderate.  5. The aortic valve is normal in structure. Aortic valve regurgitation is not visualized. No aortic stenosis is present.  6. The inferior vena cava is normal in size with greater than 50% respiratory variability, suggesting right atrial pressure of 3 mmHg. FINDINGS  Left Ventricle: Left ventricular ejection fraction, by estimation, is 60 to 65%. The left ventricle has normal function. The left ventricle has no regional wall motion abnormalities. The left ventricular internal cavity size was normal in size. There is  mild left ventricular  hypertrophy. Left ventricular diastolic parameters are consistent with Grade II diastolic dysfunction (pseudonormalization). Right Ventricle: The right ventricular size is mildly enlarged. No increase in right ventricular wall thickness. Right ventricular systolic function is normal. There is normal pulmonary artery systolic pressure. The tricuspid regurgitant velocity is 2.34  m/s, and with an assumed right atrial pressure of 8 mmHg, the estimated right ventricular systolic pressure is 29.9 mmHg. Left Atrium: Left atrial size was normal in size. Right Atrium: Right atrial size was normal in size. Pericardium: There is no evidence of pericardial effusion. Mitral Valve: The mitral valve is normal in structure. Mild mitral valve regurgitation. No evidence of mitral valve stenosis. MV peak gradient, 3.3 mmHg. The mean mitral valve gradient is 1.0 mmHg. Tricuspid Valve: The tricuspid valve is normal in structure. Tricuspid valve regurgitation is mild to moderate. No evidence of tricuspid stenosis. Aortic Valve: The aortic valve is normal in structure. Aortic valve regurgitation is not visualized. No aortic stenosis is present. Aortic valve mean gradient measures 2.0 mmHg. Aortic valve peak gradient measures 3.9 mmHg. Aortic valve area, by VTI measures 2.52 cm. Pulmonic Valve: The pulmonic valve was normal in structure. Pulmonic valve regurgitation is not visualized. No evidence of pulmonic stenosis. Aorta: The aortic root is normal in size and structure. Venous: The inferior vena cava is normal in size with greater than 50% respiratory variability, suggesting right atrial pressure of 3 mmHg. IAS/Shunts: No atrial level shunt detected by color flow Doppler.  LEFT VENTRICLE PLAX 2D LVIDd:         3.40 cm   Diastology LVIDs:         2.10 cm   LV e' medial:    6.64 cm/s LV PW:         1.00 cm   LV E/e' medial:  11.4 LV IVS:        1.20 cm   LV e' lateral:   7.51 cm/s LVOT diam:     1.80 cm   LV E/e' lateral: 10.1 LV SV:          50 LV SV Index:   33 LVOT Area:     2.54 cm  RIGHT VENTRICLE RV Basal diam:  3.50 cm RV Mid diam:    3.80 cm RV S prime:     12.90 cm/s TAPSE (M-mode): 1.9 cm LEFT ATRIUM             Index        RIGHT ATRIUM           Index LA diam:        3.00 cm 2.01 cm/m   RA Area:     14.00 cm LA Vol (  A2C):   34.8 ml 23.34 ml/m  RA Volume:   34.70 ml  23.27 ml/m LA Vol (A4C):   29.2 ml 19.59 ml/m LA Biplane Vol: 31.9 ml 21.40 ml/m  AORTIC VALVE                    PULMONIC VALVE AV Area (Vmax):    2.25 cm     PV Vmax:       1.46 m/s AV Area (Vmean):   2.25 cm     PV Peak grad:  8.5 mmHg AV Area (VTI):     2.52 cm AV Vmax:           98.70 cm/s AV Vmean:          63.800 cm/s AV VTI:            0.197 m AV Peak Grad:      3.9 mmHg AV Mean Grad:      2.0 mmHg LVOT Vmax:         87.10 cm/s LVOT Vmean:        56.300 cm/s LVOT VTI:          0.195 m LVOT/AV VTI ratio: 0.99  AORTA Ao Root diam: 2.30 cm MITRAL VALVE               TRICUSPID VALVE MV Area (PHT): 4.86 cm    TR Peak grad:   21.9 mmHg MV Area VTI:   2.85 cm    TR Vmax:        234.00 cm/s MV Peak grad:  3.3 mmHg MV Mean grad:  1.0 mmHg    SHUNTS MV Vmax:       0.90 m/s    Systemic VTI:  0.20 m MV Vmean:      51.5 cm/s   Systemic Diam: 1.80 cm MV Decel Time: 156 msec MV E velocity: 75.90 cm/s MV A velocity: 76.50 cm/s MV E/A ratio:  0.99 Julien Nordmann MD Electronically signed by Julien Nordmann MD Signature Date/Time: 05/12/2023/8:36:33 AM    Final    CT Angio Chest PE W and/or Wo Contrast Result Date: 05/10/2023 CLINICAL DATA:  68 year old female with history of lung cancer status post radiation chemotherapy presenting for concerns of increasing swelling in legs. EXAM: CT ANGIOGRAPHY CHEST WITH CONTRAST TECHNIQUE: Multidetector CT imaging of the chest was performed using the standard protocol during bolus administration of intravenous contrast. Multiplanar CT image reconstructions and MIPs were obtained to evaluate the vascular anatomy. RADIATION DOSE  REDUCTION: This exam was performed according to the departmental dose-optimization program which includes automated exposure control, adjustment of the mA and/or kV according to patient size and/or use of iterative reconstruction technique. CONTRAST:  36mL OMNIPAQUE IOHEXOL 350 MG/ML SOLN COMPARISON:  Same day chest radiograph and CT chest 01/23/2023 FINDINGS: Cardiovascular: Segmental and subsegmental pulmonary emboli in the right lower lobe pulmonary arteries. There is evidence of right heart strain with RV-LV ratio of 1.2 and mild flattening of the interventricular septum. No pericardial effusion. Crescentic filling defect in the lower thoracic descending aorta extending into the upper abdominal aorta terminating at the level of the celiac axis. This is not definitively seen on prior imaging and may represent a thrombosed dissection. The aorta in the area of thrombosed dissection measures 2.8 x 2.5 cm and the true lumen measures 2.1 cm. Aortic atherosclerotic calcification. Mediastinum/Nodes: Trachea and esophagus are unremarkable. No thoracic adenopathy Lungs/Pleura: Emphysema. Diffuse bronchial wall thickening. No focal consolidation, pleural effusion, or pneumothorax. Multiple  pulmonary nodules are redemonstrated. The largest nodules in the left upper lobe on series 7/image 106 and measures 16 by 10 mm, previously 14 x 13 mm. The largest nodule in the left lower lobe measures 11 x 8 mm, previously 11 x 9 mm. Ground-glass nodules along the right minor fissure measure 10 and 5 mm, respectively Upper Abdomen: No acute abnormality. Musculoskeletal: No acute fracture. Remote pathologic right ninth posterior rib fracture. Review of the MIP images confirms the above findings. IMPRESSION: 1. Segmental and subsegmental pulmonary emboli in the right lower lobe pulmonary arteries. Positive for acute PE with CT evidence of right heart strain (RV/LV Ratio = 1.2) consistent with at least submassive (intermediate risk) PE.  The presence of right heart strain has been associated with an increased risk of morbidity and mortality. Please refer to the "Code PE Focused" order set in EPIC. 2. Crescentic filling defect in the lower thoracic descending aorta extending into the upper abdominal aorta is not definitively seen on prior imaging and may represent a thrombosed dissection. 3. Similar appearance of multiple pulmonary nodules. Aortic Atherosclerosis (ICD10-I70.0) and Emphysema (ICD10-J43.9). Critical Value/emergent results were called by telephone at the time of interpretation on 05/10/2023 at 7:41pm to provider PA Guerry Minors, who verbally acknowledged these results. Electronically Signed   By: Minerva Fester M.D.   On: 05/10/2023 19:54   US Venous Img Lower Bilateral Result Date: 05/10/2023 CLINICAL DATA:  Bilateral lower extremity edema EXAM: BILATERAL LOWER EXTREMITY VENOUS DOPPLER ULTRASOUND TECHNIQUE: Gray-scale sonography with graded compression, as well as color Doppler and duplex ultrasound were performed to evaluate the lower extremity deep venous systems from the level of the common femoral vein and including the common femoral, femoral, profunda femoral, popliteal and calf veins including the posterior tibial, peroneal and gastrocnemius veins when visible. The superficial great saphenous vein was also interrogated. Spectral Doppler was utilized to evaluate flow at rest and with distal augmentation maneuvers in the common femoral, femoral and popliteal veins. COMPARISON:  None Available. FINDINGS: RIGHT LOWER EXTREMITY Common Femoral Vein: No evidence of thrombus. Normal compressibility, respiratory phasicity and response to augmentation. Saphenofemoral Junction: No evidence of thrombus. Normal compressibility and flow on color Doppler imaging. Profunda Femoral Vein: No evidence of thrombus. Normal compressibility and flow on color Doppler imaging. Femoral Vein: No evidence of thrombus. Normal compressibility,  respiratory phasicity and response to augmentation. Popliteal Vein: No evidence of thrombus. Normal compressibility, respiratory phasicity and response to augmentation. Calf Veins: No evidence of thrombus. Normal compressibility and flow on color Doppler imaging. Superficial Great Saphenous Vein: No evidence of thrombus. Normal compressibility. Venous Reflux:  None. Other Findings:  None. LEFT LOWER EXTREMITY Common Femoral Vein: No evidence of thrombus. Normal compressibility, respiratory phasicity and response to augmentation. Saphenofemoral Junction: No evidence of thrombus. Normal compressibility and flow on color Doppler imaging. Profunda Femoral Vein: No evidence of thrombus. Normal compressibility and flow on color Doppler imaging. Femoral Vein: No evidence of thrombus. Normal compressibility, respiratory phasicity and response to augmentation. Popliteal Vein: No evidence of thrombus. Normal compressibility, respiratory phasicity and response to augmentation. Calf Veins: No evidence of thrombus. Normal compressibility and flow on color Doppler imaging. Superficial Great Saphenous Vein: No evidence of thrombus. Normal compressibility. Venous Reflux:  None. Other Findings:  None. IMPRESSION: No evidence of deep venous thrombosis in either lower extremity. Electronically Signed   By: Malachy Moan M.D.   On: 05/10/2023 16:40   DG Chest 2 View Result Date: 05/10/2023 CLINICAL DATA:  Shortness of breath. EXAM:  CHEST - 2 VIEW COMPARISON:  Chest radiograph from 02/17/2023. FINDINGS: There are several nodules overlying the bilateral lung fields, marked with electronic arrow sign, increased in number since the prior study. Findings are concerning for metastatic disease. Further evaluation with nonemergent chest CT scan is recommended. Bilateral lung fields are otherwise clear. No acute consolidation or lung collapse. Bilateral costophrenic angles are clear. Normal cardio-mediastinal silhouette. No acute osseous  abnormalities. The soft tissues are within normal limits. Right-sided CT Port-A-Cath is again seen with its tip overlying the midportion of superior vena cava. IMPRESSION: *There are several bilateral lung nodules, increased in number since the prior study, concerning for worsening metastatic disease. Further evaluation with nonemergent chest CT scan is recommended. Electronically Signed   By: Jules Schick M.D.   On: 05/10/2023 16:18   DG Tibia/Fibula Left Result Date: 05/06/2023 CLINICAL DATA:  Blunt trauma to the lower left leg, initial encounter EXAM: LEFT TIBIA AND FIBULA - 2 VIEW COMPARISON:  None Available. FINDINGS: Mild soft tissue swelling is noted. No acute fracture or dislocation is seen. IMPRESSION: Swelling without acute bony abnormality. Electronically Signed   By: Alcide Clever M.D.   On: 05/06/2023 23:18    Time coordinating discharge: 45 mins  SIGNED:  Carollee Herter, DO [facility] 06/05/23, 1:58 PM

## 2023-06-05 NOTE — Progress Notes (Addendum)
PROGRESS NOTE    Denise Watts  VHQ:[id number] DOB: Jun 23, 1955 DOA: 05/24/2023 PCP: Sherlyn Hay, DO  Subjective: Pt seen and examined. Pt still smiling today. Pain controlled on 7.5 mg prn oxycodone. Tolerating overnight feedings. No vomiting.   Hospital Course: Hospital course / significant events:   HPI:  Denise Watts is a 68 y.o. female with medical history significant of  multiple cancer including vocal cord squamous cell carcinoma, esophageal stricture, breast cancer, adrenal carcinoma, non-small cell lung cancer (currently is on chemotherapy for lung cancer), HLD, COPD, CAD, dCHF, depression with anxiety, A-fib,. PE and DVT on Lovenox, mycoplasma pneumonia, who presents with dysphagia. Pt has hx of vocal cord squamous cell carcinoma and esophageal stricture.  States that her difficulty swallowing has been progressively worsening recently. She feels okay for swallowing liquids and water, but not solid food. She is direct admit w/ plan for feeding tube placement.   01/22: admitted to hospitalist service w/ general surgery consult 01/23: placement G-tube, plan for tube feeds tomorrow, heparin gtt tomorrow (recent hx PE) 01/24: ok to use G-tube, continue heparin today and start lovenox tomorrow. If tolerating tube feeds expect discharge tomorrow AM  01/25: significant abd pain / distension this morning. KUB/CT notable for intraluminal gas, some peritoneal air c/w surgery, no obvious obstruction. Concern for ileus. Gtube to gravity. General surgery following. Question aspiration but CXR appears normal except known nodules, abn lung sounds seem related to bronchial, no desaturation, suspect possible aspiration pneumonitis  01/26: Per surgery, ok to restart tube feeds slowly, reengaged w/ RD to assist. Pt still uncomfortable and hasn't had BM, trial suppository/enema, will see if tolerates tube feeds before giving enteral laxatives. Pt encouraged to get OOB to chair / walk if able but she  states she is probably not able d/t pain. Will repeat KUB. Will also repeat CXR, concern possible aspiration given coughing, no wheezing, lungs sound better today overall  01/27: finally had BM today, increasing tube feeds later today/tomorrow and work on ambulating.  Patient reports feeling significantly better after BM 01/28: We are hopeful for DC today however recurrent severe abdominal pain, KUB repeat, appears similar to 01/26.  Repeat suppository, if/when BM, continue bowel regimen and patient really needs to ambulate 06-05-2023 Discussed with oncology. Pt can go on Eliquis. She has a PEG now. Was only on lovenox because she could not swallow Eliquis pill.   Consultants:  General surgery   Procedures/Surgeries: 05/25/23 G tube placed - Dr Everlene Farrier    Assessment and Plan: * Dysphagia Dysphagia due to esophageal stricture s/p radiation for vocal cord squamous cell carcinoma. S/p G tube placement on 05-25-2023. 06-03-2023 continue with tube feeds 06-04-2023 tolerating overnight tube feeds. Osmolite 1.5 @ 75 ml/hr for 8 hours(total of 600 ml). 60 ml of free H2O five times a day.  06-05-2023 stable. BUN 13, Scr 0.47. continue current Rx of PEG feeds and free water.  Uncontrolled pain 06-03-2023 on fentanyl patch. Pt with multiple cancers.  On oxycodone 5 mg prn. Will increase to 7.5 mg prn to help with pain. 06-04-2023 continue fentanyl patch 12 mcg/hr. Oxycodone 7.5 mg q4h prn has definitely improved her abd pain.  06-05-2023 pain controlled with oxycodone 7.5 mg q4h prn and fentanyl patch 12 mcg/hr.  Protein-calorie malnutrition, moderate (HCC) 06-03-2023 s/p PEG 06-04-2023 Nutrition Status: Nutrition Problem: Moderate Malnutrition Etiology: chronic illness (vocal cord squamous cell carcinoma, COPD, CHF) Signs/Symptoms: mild fat depletion, moderate fat depletion, mild muscle depletion, moderate muscle depletion Interventions: Tube feeding  Hx of pulmonary embolus 06-04-2023  Continue with lovenox bid.  06-05-2023 Discussed with oncology. Pt can go on Eliquis. She has a PEG now. Was only on lovenox because she could not swallow Eliquis pill.   CAD (coronary artery disease) 06-03-2023 stable. On statin.  06-04-2023 stable.  Depression with anxiety 06-03-2023 chronic. 06-04-2023 stable.  Chronic diastolic CHF (congestive heart failure) (HCC) 06-03-2023 stable. Euvolemic. 06-04-2023 stable.  COPD (chronic obstructive pulmonary disease) (HCC) 06-03-2023 stable. 06-04-2023 stable.  DVT (deep venous thrombosis) (HCC) 06-04-2023 Continue with lovenox BID. 06-05-2023 change to Eliquis 5 mg bid. Discussed with oncology. Pt can go on Eliquis. She has a PEG now. Was only on lovenox because she could not swallow Eliquis pill.   Non-small cell lung cancer (HCC) 06-03-2023 follows with oncology. 06-04-2023 pt will f/u with oncology.  06-05-2023 stable. F/u with oncology.  PAF (paroxysmal atrial fibrillation) (HCC) 06-03-2023 continue with toprol-xl 12.5 mg daily. Remains on lovenox bid. 06-04-2023 stable. 06-05-2023 stable. Continue Toprol-xl 12.5 mg daily. Discussed with oncology. Pt can go on Eliquis. She has a PEG now. Was only on lovenox because she could not swallow Eliquis pill.  HLD (hyperlipidemia) 06-03-2023 on crestor 06-04-2023 stable.  06-05-2023 stable   DVT prophylaxis:  apixaban (ELIQUIS) tablet 5 mg  Lovenox   Code Status: Full Code Family Communication: no family at bedside Disposition Plan: SNF Reason for continuing need for hospitalization: medically stable for DC to SNF.  Objective: Vitals:   06/04/23 2042 06/05/23 0413 06/05/23 0500 06/05/23 0739  BP: 107/60 (!) 98/52  106/68  Pulse: 86 85  75  Resp: 18 20  18   Temp: 97.7 F (36.5 C) 97.8 F (36.6 C)  97.7 F (36.5 C)  TempSrc: Oral Oral  Oral  SpO2: 100% 98%  98%  Weight:   53.3 kg   Height:        Intake/Output Summary (Last 24 hours) at 06/05/2023 1309 Last data  filed at 06/05/2023 1100 Gross per 24 hour  Intake 726.25 ml  Output 1900 ml  Net -1173.75 ml   Filed Weights   06/04/23 0342 06/04/23 0742 06/05/23 0500  Weight: 41.5 kg 48.9 kg 53.3 kg    Examination:  Physical Exam Vitals and nursing note reviewed.  Constitutional:      General: She is not in acute distress.    Appearance: She is not toxic-appearing or diaphoretic.  HENT:     Head: Normocephalic and atraumatic.     Nose: Nose normal.  Eyes:     General: No scleral icterus. Cardiovascular:     Rate and Rhythm: Normal rate and regular rhythm.  Pulmonary:     Effort: Pulmonary effort is normal.     Breath sounds: Normal breath sounds.  Abdominal:     General: Bowel sounds are normal. There is no distension.     Tenderness: There is no abdominal tenderness.     Comments: +PEG  Skin:    General: Skin is warm and dry.     Capillary Refill: Capillary refill takes less than 2 seconds.  Neurological:     Mental Status: She is alert and oriented to person, place, and time.     Data Reviewed: I have personally reviewed following labs and imaging studies  CBC: Recent Labs  Lab 05/31/23 0456 06/05/23 0730  WBC 3.9* 4.9  NEUTROABS  --  3.2  HGB 10.0* 10.2*  HCT 31.3* 32.9*  MCV 103.6* 104.4*  PLT 197 205   Basic Metabolic Panel: Recent Labs  Lab 05/31/23 0456 06/01/23 0515 06/02/23 0510 06/05/23 0730  NA 140  --  141 141  K 4.3  --  4.3 4.7  CL 102  --  104 103  CO2 28  --  28 30  GLUCOSE 103*  --  93 99  BUN 10  --  12 13  CREATININE 0.43*  --  0.46 0.47  CALCIUM 8.4*  --  8.5* 8.5*  MG  --  2.3  --   --   PHOS  --  4.1  --   --    GFR: Estimated Creatinine Clearance: 50.1 mL/min (by C-G formula based on SCr of 0.47 mg/dL). Liver Function Tests: Recent Labs  Lab 06/05/23 0730  AST 25  ALT 13  ALKPHOS 49  BILITOT 0.5  PROT 5.2*  ALBUMIN 2.6*   BNP (last 3 results) Recent Labs    05/10/23 1228 05/24/23 1903  BNP 124.3* 73.7   CBG: Recent  Labs  Lab 06/04/23 1557 06/04/23 2039 06/04/23 2321 06/05/23 0417 06/05/23 1156  GLUCAP 101* 107* 133* 145* 129*   Scheduled Meds:  apixaban  5 mg Oral BID   Chlorhexidine Gluconate Cloth  6 each Topical Q0600   fentaNYL  1 patch Transdermal Q72H   free water  60 mL Per Tube 5 X Daily   lactulose  20 g Per Tube BID   metoprolol succinate  12.5 mg Oral Daily   multivitamin  15 mL Oral Daily   omeprazole  40 mg Oral Daily   mouth rinse  15 mL Mouth Rinse 4 times per day   rOPINIRole  0.25 mg Oral QHS   rosuvastatin  40 mg Oral Daily   senna  1 tablet Per Tube Daily   simethicone  80 mg Per Tube QID   sodium chloride flush  10-40 mL Intracatheter Q12H   Continuous Infusions:  feeding supplement (OSMOLITE 1.5 CAL) Stopped (06/05/23 0437)     LOS: 12 days   Time spent: 40 minutes  Carollee Herter, DO  [facility]  06/05/2023, 1:09 PM

## 2023-06-05 NOTE — Radiation Completion Notes (Signed)
Patient Name: Denise Watts, SHIROMA MRN: [id number] Date of Birth: 07-18-1955 Referring Physician: Merita Norton, M.D. Date of Service: 2023-06-05 Radiation Oncologist: Carmina Miller, M.D. [facility] [place]                             RADIATION ONCOLOGY END OF TREATMENT NOTE     Diagnosis: C34.82 Malignant neoplasm of overlapping sites of left bronchus and lung Staging on 2018-01-12: Malignant neoplasm of lower-outer quadrant of right breast of female, estrogen receptor negative (HCC) T=cT1a, N=cN0, M=cM0 Intent: Curative     HPI: Patient is a 68 year old female well-known to our department.  She has had multiple previous lung cancers including breast cancer 1997 squamous cell carcinoma vocal cord in 2015 adrenal carcinoma of unknown primary.  Patient has known bilateral lung nodules that have been followed although CT scan back in August 2024 showed multiple bilateral pulm nodules largest 1 in the left upper lobe had increased in size and density from prior examination now measuring 1.5 x 1.3 cm.  PET scan the same month showed intrathoracic nodal and pulmonary parenchyma to metastatic disease.  She had a hypermetabolic right ninth rib fracture although this was resumed reviewed at our tumor conference and thought to be traumatic in nature.  She recently underwent bronchoscopy with cytology positive for malignant cells consistent with non-small cell carcinoma.  Her case was presented at tumor conference and with the absence of any distant metastatic disease we consider treating this as a stage III non-small cell lung cancer with concurrent chemotherapy CarboTaxol and radiation.  There is some question whether this still could be metastatic breast cancer although CarboTaxol would address this.  She is seen today for evaluation.  She seems to be weak she is having some slight dyspnea on exertion.  She has a mild cough no significant hemoptysis at this time.      ==========DELIVERED  PLANS==========  First Treatment Date: 2023-03-17 Last Treatment Date: 2023-05-24   Plan Name: Lung_L Site: Lung, Left Technique: IMRT Mode: Photon Dose Per Fraction: 2 Gy Prescribed Dose (Delivered / Prescribed): 60 Gy / 66 Gy Prescribed Fxs (Delivered / Prescribed): 30 / 33     ==========ON TREATMENT VISIT DATES========== 2023-03-28, 2023-04-04, 2023-04-12, 2023-04-24, 2023-05-02, 2023-05-09, 2023-05-16, 2023-05-23     ==========UPCOMING VISITS==========       ==========APPENDIX - ON TREATMENT VISIT NOTES==========   See weekly On Treatment Notes in Epic for details in the Media tab (listed as Progress notes on the On Treatment Visit Dates listed above).

## 2023-06-05 NOTE — Progress Notes (Signed)
Pt report called to Kim at [facility] [employer] Pt chest port deaccessed. Pt being taken [facility] to [facility] by her husband

## 2023-06-05 NOTE — Plan of Care (Signed)

## 2023-06-05 NOTE — TOC Transition Note (Signed)
Transition of Care [facility]) - Discharge Note   Patient Details  Name: Denise Watts MRN: [id number] Date of Birth: November 20, 1955  [facility] CM/SW Contact:  Chapman Fitch, RN Phone Number: 06/05/2023, 3:02 PM   Clinical Narrative:    Patient will DC to: [facility] Anticipated DC date: 06/05/23  Family notified: Patient to notify husband  Transport by Per patient Husband will transport   Per MD patient ready for DC to . RN, patient, patient's family, and facility notified of DC. Discharge Summary sent to facility. RN given number for report.  Bedside RN to confirm that signed scripts are in dc packet   TOC signing off.      Barriers to Discharge: SNF Pending bed offer   Patient Goals and CMS Choice Patient states their goals for this hospitalization and ongoing recovery are:: be able to do what I want to do          Discharge Placement                       Discharge Plan and Services Additional resources added to the After Visit Summary for   In-house Referral: Clinical Social Work   Post Acute Care Choice: Skilled Nursing Facility                               Social Drivers of Health (SDOH) Interventions SDOH Screenings   Food Insecurity: No Food Insecurity (05/24/2023)  Housing: Low Risk  (05/24/2023)  Transportation Needs: No Transportation Needs (05/24/2023)  Utilities: Not At Risk (05/24/2023)  Alcohol Screen: Low Risk  (05/19/2023)  Depression (PHQ2-9): Low Risk  (05/19/2023)  Recent Concern: Depression (PHQ2-9) - High Risk (03/10/2023)  Financial Resource Strain: Low Risk  (05/19/2023)  Physical Activity: Inactive (05/19/2023)  Social Connections: Moderately Isolated (05/24/2023)  Stress: Stress Concern Present (05/19/2023)  Tobacco Use: Medium Risk (05/25/2023)  Health Literacy: Adequate Health Literacy (05/19/2023)     Readmission Risk Interventions     No data to display

## 2023-06-06 ENCOUNTER — Ambulatory Visit: Payer: Medicare HMO

## 2023-06-06 DIAGNOSIS — R1310 Dysphagia, unspecified: Secondary | ICD-10-CM | POA: Diagnosis not present

## 2023-06-06 NOTE — Consult Note (Signed)
Value-Based Care [facility] Liaison Consult Note    06/06/2023  Denise Watts Aug 10, 1955 [id number]   Update: Pt discharged to [facility] (SNF) level of care for ongoing Rehabilitation. This facility will continue to address pt's ongoing needs.   VBCI Care Management/Population Health does not replace or interfere with any arrangements made by the Inpatient Transition of Care team.   For questions contact:   Elliot Cousin, RN, [facility] Liaison [place]   [facility], Population Health Office Hours MTWF  8:00 am-6:00 pm Direct Dial: [phone] mobile [phone] [Office toll free line] Office Hours are M-F 8:30 - 5 pm [email or web address]

## 2023-06-07 ENCOUNTER — Inpatient Hospital Stay: Payer: Medicare HMO | Attending: Oncology

## 2023-06-07 DIAGNOSIS — Z9221 Personal history of antineoplastic chemotherapy: Secondary | ICD-10-CM | POA: Insufficient documentation

## 2023-06-07 DIAGNOSIS — Z7901 Long term (current) use of anticoagulants: Secondary | ICD-10-CM | POA: Insufficient documentation

## 2023-06-07 DIAGNOSIS — I2699 Other pulmonary embolism without acute cor pulmonale: Secondary | ICD-10-CM | POA: Insufficient documentation

## 2023-06-07 DIAGNOSIS — Z853 Personal history of malignant neoplasm of breast: Secondary | ICD-10-CM | POA: Insufficient documentation

## 2023-06-07 DIAGNOSIS — E538 Deficiency of other specified B group vitamins: Secondary | ICD-10-CM | POA: Insufficient documentation

## 2023-06-07 DIAGNOSIS — Z923 Personal history of irradiation: Secondary | ICD-10-CM | POA: Insufficient documentation

## 2023-06-07 DIAGNOSIS — Z87891 Personal history of nicotine dependence: Secondary | ICD-10-CM | POA: Insufficient documentation

## 2023-06-07 DIAGNOSIS — Z808 Family history of malignant neoplasm of other organs or systems: Secondary | ICD-10-CM | POA: Insufficient documentation

## 2023-06-07 DIAGNOSIS — Z8521 Personal history of malignant neoplasm of larynx: Secondary | ICD-10-CM | POA: Insufficient documentation

## 2023-06-07 DIAGNOSIS — R918 Other nonspecific abnormal finding of lung field: Secondary | ICD-10-CM | POA: Insufficient documentation

## 2023-06-07 DIAGNOSIS — C3492 Malignant neoplasm of unspecified part of left bronchus or lung: Secondary | ICD-10-CM | POA: Insufficient documentation

## 2023-06-07 NOTE — Progress Notes (Signed)
 Nutrition  Patient on scheduled for telephone visit.  Called but no answer.    Chart reviewed and noted patient with recent discharge from the hospital and placement at [facility] for rehab.   G-tube placed on 1/23.  Nutrition appointment moved to 2/26 following MD visit.    Kristee Angus B. Dasie, RD, LDN Registered Dietitian [phone]

## 2023-06-08 DIAGNOSIS — W19XXXA Unspecified fall, initial encounter: Secondary | ICD-10-CM | POA: Diagnosis not present

## 2023-06-08 DIAGNOSIS — E440 Moderate protein-calorie malnutrition: Secondary | ICD-10-CM | POA: Diagnosis not present

## 2023-06-08 DIAGNOSIS — R1314 Dysphagia, pharyngoesophageal phase: Secondary | ICD-10-CM | POA: Diagnosis not present

## 2023-06-08 DIAGNOSIS — I5032 Chronic diastolic (congestive) heart failure: Secondary | ICD-10-CM | POA: Diagnosis not present

## 2023-06-09 DIAGNOSIS — R1012 Left upper quadrant pain: Secondary | ICD-10-CM | POA: Diagnosis not present

## 2023-06-09 DIAGNOSIS — R52 Pain, unspecified: Secondary | ICD-10-CM | POA: Diagnosis not present

## 2023-06-12 ENCOUNTER — Other Ambulatory Visit: Payer: Medicare HMO

## 2023-06-12 ENCOUNTER — Ambulatory Visit: Payer: Medicare HMO

## 2023-06-12 DIAGNOSIS — R1012 Left upper quadrant pain: Secondary | ICD-10-CM | POA: Diagnosis not present

## 2023-06-14 ENCOUNTER — Encounter: Payer: Self-pay | Admitting: Surgery

## 2023-06-14 ENCOUNTER — Ambulatory Visit (INDEPENDENT_AMBULATORY_CARE_PROVIDER_SITE_OTHER): Payer: Medicare HMO | Admitting: Surgery

## 2023-06-14 VITALS — BP 93/61 | HR 86 | Temp 98.2°F | Ht 61.0 in | Wt 110.0 lb

## 2023-06-14 DIAGNOSIS — E43 Unspecified severe protein-calorie malnutrition: Secondary | ICD-10-CM

## 2023-06-14 DIAGNOSIS — Z09 Encounter for follow-up examination after completed treatment for conditions other than malignant neoplasm: Secondary | ICD-10-CM

## 2023-06-14 NOTE — Patient Instructions (Signed)
Follow-up with our office as needed.  Please call and ask to speak with a nurse if you develop questions or concerns.

## 2023-06-14 NOTE — Progress Notes (Signed)
Outpatient Surgical Follow Up  06/14/2023  Denise Watts is an 69 y.o. female.   Chief Complaint  Patient presents with   Routine Post Op    HPI: Denise Watts 68 year old female with widely metastatic disease and recent PE.  I did open G-tube insertion 3 weeks ago and this was uneventful.  She does have significant medical issues that made her go to a rehab facility.  She is doing well.  She has tolerated tube feeds and now she is on Eliquis via the G-tube.  No fevers no chills no evidence of complications related to the G-tube.  She did have a CT scan that have personally reviewed showing evidence of well-positioned G-tube without evidence of complications  Past Medical History:  Diagnosis Date   Acute pulmonary embolism (HCC) 05/10/2023   Adenomatous colon polyp    Adrenal carcinoma, right (HCC) 12/28/2016   a.) high grade carcinoma with extensive necrosis --> s/p adrenalectomy   Anxiety    Aortic atherosclerosis (HCC)    Arthritis of right knee    Atrial fibrillation and flutter (HCC)    a.) CHA2DS2-VASc = 3 (age, sex, vascular disease history) as of 02/13/2023; b.) cardiac rate/rhythm maintained intrinsically without pharmacological intervention; no chronic OAC (does take low dose ASA)   B12 deficiency    Breast cancer (HCC) 1997   a.) stage IB (cT1a, cN0, cM0, G3, ER+, PR-, HER2-); s/p lumpectomy + XRT   COPD (chronic obstructive pulmonary disease) (HCC)    Coronary artery disease    a.) cCTA 12/09/2021: Ca2+ = 135 (80th %'ile for age/sex/race match contol) --> 25-49% pLAD and RCA distributions   Depression    Diastolic dysfunction    a.) TTE 12/11/2020, no RWMAs, norm RVSF, G1DD; b.) TTE 12/14/2021: EF 55-60, no RWMAs, norm RVSF, G1DD; c.) TTE 01/27/2023: EF 50-55%, no RWMAs, G1DD, mild RAE, mild MR, mod TR   Dry cough 03/02/2017   Dyspnea    Full dentures    GERD (gastroesophageal reflux disease)    History of hiatal hernia    HLD (hyperlipidemia)    Invasive carcinoma of  breast (HCC) 11/15/2017   a.) path (+) for G3, ER -, PR -, HER2/neu - ; s/p lumpectomy 01/17/2018 + 4 cycles TC systemic chemotherapy + XRT   Long term current use of aspirin    Personal history of chemotherapy    Personal history of radiation therapy    Pneumothorax of left lung after biopsy 01/25/2023   a.) hypoxic in PACU requiring re-intubation --> transferred to ICU --> chest tube placed by PCCM   RLS (restless legs syndrome)    a.) on ropinirole   Skin cancer    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   a.) Stage IB (cT1a, cN0, cM0, G3, ER+, PR-, HER2-)    Past Surgical History:  Procedure Laterality Date   BIOPSY  01/05/2023   Procedure: BIOPSY;  Surgeon: Toney Reil, MD;  Location: [facility];  Service: Gastroenterology;;   BREAST BIOPSY Right 1997   positive   BREAST BIOPSY Right 11/15/2017   INVASIVE MAMMARY CARCINOMA triple negative   BREAST BIOPSY Right 07/20/2020   [place] bx, Q marker, neg   BREAST LUMPECTOMY Right 1997   2019 also   BREAST LUMPECTOMY WITH SENTINEL LYMPH NODE BIOPSY Right 12/08/2017   Procedure: BREAST LUMPECTOMY WITH SENTINEL LYMPH NODE BX;  Surgeon: Earline Mayotte, MD;  Location: [facility];  Service: General;  Laterality: Right;   BREAST REDUCTION WITH MASTOPEXY  Left 06/10/2019   Procedure: Left breast mastopexy reduction for symmetry;  Surgeon: Peggye Form, DO;  Location: [facility];  Service: Plastics;  Laterality: Left;  total 2.5 hours   CATARACT EXTRACTION W/PHACO Right 02/07/2022   Procedure: CATARACT EXTRACTION PHACO AND INTRAOCULAR LENS PLACEMENT (IOC) RIGHT DIABETIC;  Surgeon: Nevada Crane, MD;  Location: [facility];  Service: Ophthalmology;  Laterality: Right;  3.60 00:34.7   CATARACT EXTRACTION W/PHACO Left 02/28/2022   Procedure: CATARACT EXTRACTION PHACO AND INTRAOCULAR LENS PLACEMENT (IOC) LEFT DIABETIC 4.11 00:26.1;  Surgeon: Nevada Crane, MD;  Location: [facility];   Service: Ophthalmology;  Laterality: Left;   COLONOSCOPY  2015   COLONOSCOPY N/A 01/05/2023   Procedure: COLONOSCOPY;  Surgeon: Toney Reil, MD;  Location: [facility];  Service: Gastroenterology;  Laterality: N/A;   COLONOSCOPY WITH PROPOFOL N/A 10/29/2019   Procedure: COLONOSCOPY WITH PROPOFOL;  Surgeon: Midge Minium, MD;  Location: [facility];  Service: Endoscopy;  Laterality: N/A;   ESOPHAGOGASTRODUODENOSCOPY (EGD) WITH PROPOFOL N/A 05/24/2019   Procedure: ESOPHAGOGASTRODUODENOSCOPY (EGD) WITH PROPOFOL;  Surgeon: Midge Minium, MD;  Location: [facility];  Service: Endoscopy;  Laterality: N/A;   ESOPHAGOGASTRODUODENOSCOPY (EGD) WITH PROPOFOL N/A 09/06/2022   Procedure: ESOPHAGOGASTRODUODENOSCOPY (EGD) WITH PROPOFOL;  Surgeon: Midge Minium, MD;  Location: [facility];  Service: Endoscopy;  Laterality: N/A;   ESOPHAGOGASTRODUODENOSCOPY (EGD) WITH PROPOFOL  01/05/2023   Procedure: ESOPHAGOGASTRODUODENOSCOPY (EGD) WITH PROPOFOL;  Surgeon: Toney Reil, MD;  Location: [facility];  Service: Gastroenterology;;   ESOPHAGUS SURGERY     GASTROSTOMY N/A 05/25/2023   Procedure: OPEN INSERTION OF GASTROSTOMY TUBE;  Surgeon: Leafy Ro, MD;  Location: [facility];  Service: General;  Laterality: N/A;   IR IMAGING GUIDED PORT INSERTION  02/07/2023   KNEE SURGERY Right    LIPOSUCTION WITH LIPOFILLING Right 06/10/2019   Procedure: right breast scar and capsule release with fat grafting;  Surgeon: Peggye Form, DO;  Location: [facility];  Service: Plastics;  Laterality: Right;   POLYPECTOMY  01/05/2023   Procedure: POLYPECTOMY;  Surgeon: Toney Reil, MD;  Location: [facility] END[facility]COPY;  Service: Gastroenterology;;   [facility] REMOVAL     PORTACATH PLACEMENT Right 01/17/2018   Procedure: INSERTION PORT-A-CATH- RIGHT;  Surgeon: Earline Mayotte, MD;  Location: [facility];  Service: General;  Laterality: Right;   PULMONARY THROMBECTOMY Bilateral 05/12/2023    Procedure: PULMONARY THROMBECTOMY;  Surgeon: Annice Needy, MD;  Location: [facility];  Service: Cardiovascular;  Laterality: Bilateral;   ROBOTIC ADRENALECTOMY Right 12/28/2016   Procedure: ROBOTIC ADRENALECTOMY;  Surgeon: Vanna Scotland, MD;  Location: [facility];  Service: Urology;  Laterality: Right;   SKIN CANCER EXCISION     THROAT SURGERY  1998   throat cancer    TUBAL LIGATION     VENTRAL HERNIA REPAIR N/A 03/03/2017   Procedure: HERNIA REPAIR VENTRAL ADULT;  Surgeon: Ricarda Frame, MD;  Location: [facility];  Service: General;  Laterality: N/A;   VIDEO BRONCHOSCOPY WITH ENDOBRONCHIAL ULTRASOUND N/A 01/25/2023   Procedure: VIDEO BRONCHOSCOPY WITH ENDOBRONCHIAL ULTRASOUND;  Surgeon: Vida Rigger, MD;  Location: [facility];  Service: Thoracic;  Laterality: N/A;   VIDEO BRONCHOSCOPY WITH ENDOBRONCHIAL ULTRASOUND N/A 02/17/2023   Procedure: VIDEO BRONCHOSCOPY WITH ENDOBRONCHIAL ULTRASOUND;  Surgeon: Vida Rigger, MD;  Location: [facility];  Service: Thoracic;  Laterality: N/A;    Family History  Problem Relation Age of Onset   Diabetes Mother    Heart attack Mother    Cancer  Sister        cancer of eyes, spread to lung, other places. died at 3   Cancer Sister 39       unknown primary, spread to bone, brain died in 67's   Cancer Paternal Aunt        unk type   Cancer Paternal Grandmother        type unk   Prostate cancer Neg Hx    Kidney cancer Neg Hx    Bladder Cancer Neg Hx    Breast cancer Neg Hx     Social History:  reports that she quit smoking about 28 years ago. Her smoking use included cigarettes. She started smoking about 58 years ago. She has a 45 pack-year smoking history. She has been exposed to tobacco smoke. She has never used smokeless tobacco. She reports that she does not currently use alcohol after a past usage of about 4.0 standard drinks of alcohol per week. She reports that she does not use drugs.  Allergies:  Allergies  Allergen Reactions    Sulfa Antibiotics Other (See Comments)    Mouth sore and raw    Medications reviewed.    ROS Full ROS performed and is otherwise negative other than what is stated in HPI   BP 93/61   Pulse 86   Temp 98.2 F (36.8 C)   Ht 5\' 1"  (1.549 m)   Wt 110 lb (49.9 kg)   SpO2 97%   BMI 20.78 kg/m   Physical Exam Vitals and nursing note reviewed. Exam conducted with a chaperone present.  Constitutional:      General: She is not in acute distress.    Comments: She is debilitated and chronically ill and malnourished  Pulmonary:     Effort: Pulmonary effort is normal.     Breath sounds: No stridor.  Abdominal:     General: Abdomen is flat. There is no distension.     Palpations: There is no mass.     Tenderness: There is no abdominal tenderness. There is no guarding or rebound.     Hernia: No hernia is present.     Comments: G-tube in place without evidence of infection.  No peritonitis wound healing well  Musculoskeletal:        General: Normal range of motion.  Skin:    General: Skin is warm and dry.     Capillary Refill: Capillary refill takes less than 2 seconds.  Neurological:     General: No focal deficit present.     Mental Status: She is alert and oriented to person, place, and time.  Psychiatric:        Mood and Affect: Mood normal.        Behavior: Behavior normal.        Thought Content: Thought content normal.        Judgment: Judgment normal.     Assessment/Plan: Ms. Facemire is 3 weeks out from open G-tube placement no evidence of complications related to her surgery.  Discussed with her in detail about her disease process.  She feels much better after she has been on tube feed as she has more energy. I do not see any need for her to be on a nursing home or facility but that will be the decision of the primary doctor at that facility.  I will be happy to see her on a as needed basis. They are very appreciative.  Please note the spent 30 minutes in this encounter  including  counseling the patient, personally reviewing imaging studies, placing orders and performing documentation  Sterling Big, MD [facility] General Surgeon

## 2023-06-15 DIAGNOSIS — E440 Moderate protein-calorie malnutrition: Secondary | ICD-10-CM | POA: Diagnosis not present

## 2023-06-15 DIAGNOSIS — R1314 Dysphagia, pharyngoesophageal phase: Secondary | ICD-10-CM | POA: Diagnosis not present

## 2023-06-15 DIAGNOSIS — R1012 Left upper quadrant pain: Secondary | ICD-10-CM | POA: Diagnosis not present

## 2023-06-15 DIAGNOSIS — Z86718 Personal history of other venous thrombosis and embolism: Secondary | ICD-10-CM | POA: Diagnosis not present

## 2023-06-19 ENCOUNTER — Ambulatory Visit: Payer: Medicare HMO | Admitting: Family Medicine

## 2023-06-19 ENCOUNTER — Encounter: Payer: Self-pay | Admitting: Family Medicine

## 2023-06-19 ENCOUNTER — Telehealth: Payer: Self-pay

## 2023-06-19 ENCOUNTER — Telehealth: Payer: Self-pay | Admitting: Family Medicine

## 2023-06-19 VITALS — BP 105/54 | HR 79 | Resp 18 | Ht 61.0 in | Wt 112.0 lb

## 2023-06-19 DIAGNOSIS — R1314 Dysphagia, pharyngoesophageal phase: Secondary | ICD-10-CM | POA: Diagnosis not present

## 2023-06-19 DIAGNOSIS — Z931 Gastrostomy status: Secondary | ICD-10-CM | POA: Diagnosis not present

## 2023-06-19 DIAGNOSIS — E440 Moderate protein-calorie malnutrition: Secondary | ICD-10-CM | POA: Diagnosis not present

## 2023-06-19 DIAGNOSIS — J439 Emphysema, unspecified: Secondary | ICD-10-CM

## 2023-06-19 MED ORDER — PREDNISONE 20 MG PO TABS: 40.0000 mg | ORAL_TABLET | Freq: Every day | ORAL | 0 refills | Status: AC

## 2023-06-19 MED ORDER — AZITHROMYCIN 250 MG PO TABS: ORAL_TABLET | ORAL | 0 refills | Status: AC

## 2023-06-19 NOTE — Telephone Encounter (Signed)
Home Health Verbal Orders - Caller/Agency: [facility] Callback Number: [phone]  Service Requested: Skilled Nursing Frequency: 1 week 1  Any new concerns about the patient? No

## 2023-06-19 NOTE — Progress Notes (Signed)
Established patient visit   Patient: Denise Watts   DOB: 08-03-1955   68 y.o. Female  MRN: [id number] Visit Date: 06/19/2023  Today's healthcare provider: Ronnald Ramp, MD   Chief Complaint  Patient presents with   Hospitalization Follow-up   Subjective       Discussed the use of AI scribe software for clinical note transcription with the patient, who gave verbal consent to proceed.  History of Present Illness   Denise Watts is a 68 year old female with multiple cancers and esophageal stricture who presents for hospital follow-up after admission for dysphagia. She is accompanied by her husband, Gabriel Rung.  She was admitted to the hospital from May 24, 2023, to June 05, 2023, due to worsening dysphagia, experiencing difficulty swallowing, managing liquids but not solids. During her stay, a G-tube was placed on May 25, 2023, to assist with nutrition. Her dysphagia was attributed to esophageal stricture following radiation treatment for vocal cord squamous cell carcinoma. She was discharged with a PEG tube in place and is currently on Osmolite 1.5 cal nutritional supplements via the PEG tube at 75 mL per hour for eight hours, totaling 600 mL, along with 60 mL of free water five times a day.  She has a significant history of multiple cancers, including vocal cord squamous cell carcinoma, esophageal stricture, breast cancer, adrenal carcinoma, and non-small cell lung cancer, for which she is currently undergoing chemotherapy. She also has a history of hyperlipidemia, COPD, coronary artery disease, diastolic congestive heart failure, depression with anxiety, atrial fibrillation, and a history of pulmonary embolism and deep vein thrombosis, for which she is on Eliquis 5 mg daily.  She experiences intermittent severe abdominal pain, for which her bowel regimen was adjusted during her hospital stay. For pain management, she is on oxycodone 7.5 mg every four hours as needed and  a fentanyl patch at 12 mcg per hour.  She reports a recent fall in the bathroom, resulting in rib pain and swelling in her ankles, more pronounced on the left side where she had a previous DVT. She attributes the fall to her legs and ankles giving way, as noted by her physical therapist.  She is experiencing symptoms of a respiratory infection, including a cough and nasal congestion, and is taking Mucinex DM and Tessalon Perles for relief. She also mentions having a 'terrible cold' and is unsure if it is the flu. No ability to swallow solids, only liquids.     Unable to complete transitions of care call, [facility] unable to connect with patient for call      Past Medical History:  Diagnosis Date   Acute pulmonary embolism (HCC) 05/10/2023   Adenomatous colon polyp    Adrenal carcinoma, right (HCC) 12/28/2016   a.) high grade carcinoma with extensive necrosis --> s/p adrenalectomy   Anxiety    Aortic atherosclerosis (HCC)    Arthritis of right knee    Atrial fibrillation and flutter (HCC)    a.) CHA2DS2-VASc = 3 (age, sex, vascular disease history) as of 02/13/2023; b.) cardiac rate/rhythm maintained intrinsically without pharmacological intervention; no chronic OAC (does take low dose ASA)   B12 deficiency    Breast cancer (HCC) 1997   a.) stage IB (cT1a, cN0, cM0, G3, ER+, PR-, HER2-); s/p lumpectomy + XRT   COPD (chronic obstructive pulmonary disease) (HCC)    Coronary artery disease    a.) cCTA 12/09/2021: Ca2+ = 135 (80th %'ile for age/sex/race match contol) --> 25-49% pLAD  and RCA distributions   Depression    Diastolic dysfunction    a.) TTE 12/11/2020, no RWMAs, norm RVSF, G1DD; b.) TTE 12/14/2021: EF 55-60, no RWMAs, norm RVSF, G1DD; c.) TTE 01/27/2023: EF 50-55%, no RWMAs, G1DD, mild RAE, mild MR, mod TR   Dry cough 03/02/2017   Dyspnea    Full dentures    GERD (gastroesophageal reflux disease)    History of hiatal hernia    HLD (hyperlipidemia)    Invasive carcinoma of breast  (HCC) 11/15/2017   a.) path (+) for G3, ER -, PR -, HER2/neu - ; s/p lumpectomy 01/17/2018 + 4 cycles TC systemic chemotherapy + XRT   Long term current use of aspirin    Personal history of chemotherapy    Personal history of radiation therapy    Pneumothorax of left lung after biopsy 01/25/2023   a.) hypoxic in PACU requiring re-intubation --> transferred to ICU --> chest tube placed by PCCM   RLS (restless legs syndrome)    a.) on ropinirole   Skin cancer    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   benzonatate (TESSALON) 100 MG capsule Take 2 capsules (200 mg total) by mouth 3 (three) times daily as needed for cough.   bisacodyl (DULCOLAX) 10 MG suppository Place 1 suppository (10 mg total) rectally daily as needed for moderate constipation or severe constipation.   dextromethorphan-guaiFENesin (MUCINEX DM) 30-600 MG 12hr tablet Take 1 tablet by mouth 2 (two) times daily as needed for cough.   fentaNYL (DURAGESIC) 12 MCG/HR Place 1 patch onto the skin every 3 (three) days.   lactulose (CHRONULAC) 10 GM/15ML solution Place 30 mLs (20 g total) into feeding tube 2 (two) times daily.   lidocaine (LIDODERM) 5 % Place 1 patch onto the skin daily. Remove & Discard patch within 12 hours or as directed by MD   metoprolol succinate (TOPROL-XL) 25 MG 24 hr tablet Take 1 tablet (25 mg total) by mouth daily.   Nutritional Supplements (FEEDING SUPPLEMENT, OSMOLITE 1.5 CAL,) LIQD Place 600 mLs into feeding tube continuous. Nocturnal feedings: Osmolite 1.5 at 75 ml/hr over 8 hour period (2000-0400)   omeprazole (KONVOMEP) 2 mg/mL SUSP oral suspension Take 20 mLs (40 mg total) by mouth daily.   ondansetron (ZOFRAN) 4 MG tablet Take 1 tablet (4 mg total) by mouth every 6 (six)  hours as needed for nausea or vomiting.   oxyCODONE 7.5 MG TABS Place 7.5 mg into feeding tube every 4 (four) hours as needed for severe pain (pain score 7-10) or moderate pain (pain score 4-6).   rOPINIRole (REQUIP) 0.25 MG tablet Place 1 tablet (0.25 mg total) into feeding tube at bedtime.   rosuvastatin (CRESTOR) 40 MG tablet Place 1 tablet (40 mg total) into feeding tube daily.   senna (SENOKOT) 8.6 MG TABS tablet Place 1 tablet (8.6 mg total) into feeding tube daily.   simethicone (MYLICON) 80 MG chewable tablet Place 1 tablet (80 mg total) into feeding tube 4 (four) times daily.   sucralfate (CARAFATE) 1 GM/10ML suspension Take 10 mLs by mouth 4 (four) times daily.   triamcinolone cream (KENALOG) 0.1 % Apply 1 Application topically 2 (two) times daily.   Water For Irrigation, Sterile (FREE WATER) SOLN  Place 60 mLs into feeding tube 5 (five) times daily.   Facility-Administered Medications Prior to Visit  Medication Dose Route Frequency Provider   cyanocobalamin ((VITAMIN B-12)) injection 1,000 mcg  1,000 mcg Intramuscular Q30 days Creig Hines, MD   cyanocobalamin (VITAMIN B12) injection 1,000 mcg  1,000 mcg Intramuscular Q30 days Creig Hines, MD    Review of Systems      Objective    BP (!) 105/54   Pulse 79   Resp 18   Ht 5\' 1"  (1.549 m)   Wt 112 lb (50.8 kg)   SpO2 100%   BMI 21.16 kg/m      Physical Exam Vitals reviewed.  Constitutional:      General: She is not in acute distress.    Appearance: Normal appearance. She is not ill-appearing, toxic-appearing or diaphoretic.  Eyes:     Conjunctiva/sclera: Conjunctivae normal.  Cardiovascular:     Rate and Rhythm: Normal rate and regular rhythm.     Pulses: Normal pulses.     Heart sounds: Normal heart sounds. No murmur heard.    No friction rub. No gallop.  Pulmonary:     Effort: Pulmonary effort is normal. No respiratory distress.     Breath sounds: Normal breath sounds. No stridor. No wheezing, rhonchi or  rales.  Abdominal:     General: Bowel sounds are normal. There is no distension.     Palpations: Abdomen is soft.     Tenderness: There is no abdominal tenderness.    Musculoskeletal:     Right lower leg: No edema.     Left lower leg: No edema.  Skin:    Findings: No erythema or rash.  Neurological:     Mental Status: She is alert and oriented to person, place, and time.  Psychiatric:        Mood and Affect: Mood and affect normal.        Speech: Speech normal.        Behavior: Behavior normal. Behavior is cooperative.       No results found for any visits on 06/19/23.  Assessment & Plan     Problem List Items Addressed This Visit       Respiratory   COPD (chronic obstructive pulmonary disease) (HCC)   Relevant Medications   predniSONE (DELTASONE) 20 MG tablet   azithromycin (ZITHROMAX) 250 MG tablet   Other Relevant Orders   AMB Referral [facility] Care Management     Digestive   Dysphagia   Relevant Orders   Amb ref to Medical Nutrition Therapy-MNT     Other   Protein-calorie malnutrition, moderate (HCC) - Primary   Relevant Orders   Amb ref to Medical Nutrition Therapy-MNT   AMB Referral [facility] Care Management   Other Visit Diagnoses       PEG (percutaneous endoscopic gastrostomy) status (HCC)       Relevant Orders   Amb ref to Medical Nutrition Therapy-MNT   AMB Referral [facility]BCI Care Management       Assessment and Plan    Dysphagia secondary to esophageal stricture Dysphagia due to esophageal stricture post-radiation for vocal cord squamous cell carcinoma. PEG tube in place, on Osmolite 1.5 cal via PEG tube. Can swallow liquids but not solids. Emphasized proper nutrition and nutritionist's role in feeding regimen management. Ongoing monitoring and adjustments needed to prevent malnutrition. - Refer to nutrition for malnutrition management - Ensure proper PEG tube supplies  COPD exacerbation Symptoms include wheezing, productive cough, and dyspnea. Currently  on Mucinex DM and Tessalon Perles. Discussed prednisone and azithromycin for exacerbation management, including side effects of prednisone (increased blood sugar, infection risk) and importance of completing antibiotics to prevent resistance. - Prescribe prednisone 40 mg daily for 5 days - Prescribe azithromycin 500 mg on day 1, then 250 mg daily for 4 days - Continue Mucinex DM and Tessalon Perles - Refer to pharmacy for medication management and assistance with crushing medications for PEG tube  Pain management On oxycodone 7.5 mg every 4 hours PRN and fentanyl patch 12 mcg/hr. Reports ongoing arm pain post-fall. Discussed opioid risks (dependency, constipation) and need for careful monitoring. - Continue current pain management regimen with oxycodone and fentanyl patch  Chronic anticoagulation therapy PE and DVT, on Eliquis 5 mg daily. Emphasized adherence to prevent thromboembolic events and need for regular follow-up to monitor for bleeding complications. - Continue Eliquis 5 mg daily  General Health Maintenance Multiple chronic conditions: hyperlipidemia, CAD, diastolic CHF, depression with anxiety, and atrial fibrillation. On Crestor 40 mg daily and metoprolol 25 mg daily. Emphasized medication adherence and regular follow-up for chronic condition management. - Continue current medications for chronic conditions - Refer to chronic care management for coordination of care and resources  Follow-up - Schedule follow-up appointment in one month - Ensure chronic care management team contacts patient for coordination of care.         Return in about 1 month (around 07/17/2023) for CHRONIC F/U.         Ronnald Ramp, MD  [facility] [phone] (phone) [phone] (fax)  [facility]

## 2023-06-19 NOTE — Transitions of Care (Post Inpatient/ED Visit) (Unsigned)
   06/19/2023  Name: ROZELLA SERVELLO MRN: [id number] DOB: [birth year]  Today's TOC FU Call Status: Today's TOC FU Call Status:: Unsuccessful Call (1st Attempt) Unsuccessful Call (1st Attempt) Date: 06/19/23  Attempted to reach the patient regarding the most recent Inpatient/ED visit.  Follow Up Plan: Additional outreach attempts will be made to reach the patient to complete the Transitions of Care (Post Inpatient/ED visit) call.   Signature Karena Addison, LPN [facility] Nurse Health Advisor Direct Dial [phone]

## 2023-06-19 NOTE — Patient Instructions (Signed)
VISIT SUMMARY:  Denise Watts, you had a follow-up appointment today after your recent hospital stay for difficulty swallowing. We discussed your current health status, including your nutrition through the PEG tube, management of your COPD symptoms, pain management, and your ongoing anticoagulation therapy. We also reviewed your general health maintenance and the importance of adhering to your medication regimen.  YOUR PLAN:  -DYSPHAGIA SECONDARY TO ESOPHAGEAL STRICTURE: Dysphagia means difficulty swallowing, which in your case is due to a narrowing of the esophagus following radiation treatment for vocal cord cancer. You have a PEG tube in place for nutrition, and it is important to follow the feeding regimen and monitor for any signs of malnutrition. We will refer you to a nutritionist for further management and ensure you have the necessary supplies for your PEG tube.  -COPD EXACERBATION: COPD exacerbation refers to a worsening of your chronic lung condition, causing symptoms like wheezing, cough, and difficulty breathing. We have prescribed prednisone and azithromycin to help manage this flare-up. It's important to complete the antibiotic course to prevent resistance. Continue taking Mucinex DM and Tessalon Perles for symptom relief. We will also refer you to the pharmacy for help with medication management.  -PAIN MANAGEMENT: You are currently managing your pain with oxycodone and a fentanyl patch. We discussed the risks associated with opioid use, such as dependency and constipation, and the need for careful monitoring. Continue with your current pain management regimen.  -CHRONIC ANTICOAGULATION THERAPY: You are on Eliquis to prevent blood clots due to your history of pulmonary embolism and deep vein thrombosis. It is crucial to take this medication as prescribed to avoid serious complications. Regular follow-up is needed to monitor for any bleeding issues.    INSTRUCTIONS:  Please schedule a  follow-up appointment in one month. The chronic care management team will contact you to help coordinate your care.

## 2023-06-20 ENCOUNTER — Telehealth: Payer: Self-pay | Admitting: *Deleted

## 2023-06-20 NOTE — Transitions of Care (Post Inpatient/ED Visit) (Signed)
   06/20/2023  Name: Denise Watts MRN: [id number] DOB: December 08, 1955  Today's TOC FU Call Status: Today's TOC FU Call Status:: Unsuccessful Call (1st Attempt) Unsuccessful Call (1st Attempt) Date: 06/19/23  Attempted to reach the patient regarding the most recent Inpatient/ED visit.  Follow Up Plan: No further outreach attempts will be made at this time. We have been unable to contact the patient. Patient already seen in office Signature Karena Addison, LPN [facility] Nurse Health Advisor Direct Dial [phone]

## 2023-06-20 NOTE — Progress Notes (Signed)
Complex Care Management Note  Care Guide Note 06/20/2023 Name: Denise Watts MRN: [id number] DOB: 1955/05/24  [name] is a 68 y.o. year old female who sees Pardue, Monico Blitz, DO for primary care. I reached out to [name] by phone today to offer complex care management services.  [name] was given information about Complex Care Management services today including:   The Complex Care Management services include support from the care team which includes your Nurse Care Manager, Clinical Social Worker, or Pharmacist.  The Complex Care Management team is here to help remove barriers to the health concerns and goals most important to you. Complex Care Management services are voluntary, and the patient may decline or stop services at any time by request to their care team member.   Complex Care Management Consent Status: Patient agreed to services and verbal consent obtained.   Follow up plan:  Telephone appointment with complex care management team member scheduled for:  06/27/2023 and 07/05/2023  Encounter Outcome:  Patient Scheduled  Burman Nieves, CMA, Care Guide [facility]  [facility], [facility] Guide Direct Dial: [phone]  Fax: [phone] Website: [email or web address]

## 2023-06-21 ENCOUNTER — Other Ambulatory Visit: Payer: Self-pay | Admitting: Hospice and Palliative Medicine

## 2023-06-21 DIAGNOSIS — E440 Moderate protein-calorie malnutrition: Secondary | ICD-10-CM | POA: Diagnosis not present

## 2023-06-21 DIAGNOSIS — I5032 Chronic diastolic (congestive) heart failure: Secondary | ICD-10-CM | POA: Diagnosis not present

## 2023-06-21 DIAGNOSIS — I700 Atherosclerosis of aorta: Secondary | ICD-10-CM | POA: Diagnosis not present

## 2023-06-21 DIAGNOSIS — Z431 Encounter for attention to gastrostomy: Secondary | ICD-10-CM | POA: Diagnosis not present

## 2023-06-21 DIAGNOSIS — I2510 Atherosclerotic heart disease of native coronary artery without angina pectoris: Secondary | ICD-10-CM | POA: Diagnosis not present

## 2023-06-21 DIAGNOSIS — I480 Paroxysmal atrial fibrillation: Secondary | ICD-10-CM | POA: Diagnosis not present

## 2023-06-21 DIAGNOSIS — R1310 Dysphagia, unspecified: Secondary | ICD-10-CM | POA: Diagnosis not present

## 2023-06-21 DIAGNOSIS — I4892 Unspecified atrial flutter: Secondary | ICD-10-CM | POA: Diagnosis not present

## 2023-06-21 DIAGNOSIS — J449 Chronic obstructive pulmonary disease, unspecified: Secondary | ICD-10-CM | POA: Diagnosis not present

## 2023-06-22 ENCOUNTER — Encounter: Payer: Self-pay | Admitting: Oncology

## 2023-06-22 ENCOUNTER — Other Ambulatory Visit: Payer: Self-pay

## 2023-06-22 ENCOUNTER — Telehealth: Payer: Self-pay | Admitting: Family Medicine

## 2023-06-22 DIAGNOSIS — R52 Pain, unspecified: Secondary | ICD-10-CM

## 2023-06-22 DIAGNOSIS — F418 Other specified anxiety disorders: Secondary | ICD-10-CM

## 2023-06-22 MED ORDER — BUSPIRONE HCL 5 MG PO TABS: 5.0000 mg | ORAL_TABLET | Freq: Three times a day (TID) | ORAL | 0 refills | Status: DC | PRN

## 2023-06-22 MED ORDER — APIXABAN 5 MG PO TABS: 5.0000 mg | ORAL_TABLET | Freq: Two times a day (BID) | ORAL | Status: DC

## 2023-06-22 MED ORDER — MELOXICAM 7.5 MG PO TABS: 7.5000 mg | ORAL_TABLET | Freq: Every day | ORAL | 0 refills | Status: DC

## 2023-06-22 NOTE — Telephone Encounter (Signed)
Spoke with [facility]. Agency is needing new start date. Reports they were set to see patient on Saturday and were not able to. Verbal orders not needed. Call back # [phone] Primary Office # [phone] Fax # [phone]

## 2023-06-22 NOTE — Telephone Encounter (Signed)
[facility]  faxed refill request for the following medications:   meloxicam (MOBIC) 7.5 MG tablet    busPIRone (BUSPAR) 5 MG tablet     Please advise.

## 2023-06-22 NOTE — Telephone Encounter (Signed)
LVMTCB. OK for P[facility] to advise

## 2023-06-23 ENCOUNTER — Emergency Department
Admission: EM | Admit: 2023-06-23 | Discharge: 2023-06-23 | Disposition: A | Discharge status: Home / Self Care | Payer: Medicare HMO | Attending: Emergency Medicine | Admitting: Emergency Medicine

## 2023-06-23 ENCOUNTER — Telehealth: Payer: Self-pay | Admitting: *Deleted

## 2023-06-23 ENCOUNTER — Other Ambulatory Visit: Payer: Self-pay

## 2023-06-23 ENCOUNTER — Emergency Department: Payer: Medicare HMO

## 2023-06-23 DIAGNOSIS — Z931 Gastrostomy status: Secondary | ICD-10-CM | POA: Diagnosis not present

## 2023-06-23 DIAGNOSIS — Z431 Encounter for attention to gastrostomy: Secondary | ICD-10-CM | POA: Diagnosis not present

## 2023-06-23 DIAGNOSIS — Z4659 Encounter for fitting and adjustment of other gastrointestinal appliance and device: Secondary | ICD-10-CM | POA: Insufficient documentation

## 2023-06-23 DIAGNOSIS — R1310 Dysphagia, unspecified: Secondary | ICD-10-CM | POA: Insufficient documentation

## 2023-06-23 DIAGNOSIS — K9423 Gastrostomy malfunction: Secondary | ICD-10-CM | POA: Insufficient documentation

## 2023-06-23 MED ORDER — DIATRIZOATE MEGLUMINE & SODIUM 66-10 % PO SOLN
30.0000 mL | Freq: Once | ORAL | Status: AC
  Administered 2023-06-23: 30 mL

## 2023-06-23 MED ORDER — APIXABAN 5 MG PO TABS: 5.0000 mg | ORAL_TABLET | Freq: Two times a day (BID) | ORAL | 3 refills | Status: DC

## 2023-06-23 NOTE — ED Notes (Signed)
Supplies for new gtube given to PA

## 2023-06-23 NOTE — ED Provider Notes (Signed)
[facility] Emergency Department Provider Note     Event Date/Time   First MD Initiated Contact with Patient 06/23/23 1651     (approximate)   History   Tube Replacement   HPI  Denise Watts is a 68 y.o. female with a history of widely metastatic disease and recent PE. She underwent an open G-tube insertion 4 weeks ago, with Dr. Everlene Farrier due to dysphagia.  She presents to the ED today, noting dislodgment of her PEG tube.  Patient reports last night she cleanse the area after bath, and snipped the 2 large sutures that were in place holding the flange.  This morning, she noted that the G-tube spontaneously fell out.  She was up at dinner table when that happened.  She presents to the ED several hours after dislodgment for evaluation replacement.  Physical Exam   Triage Vital Signs: ED Triage Vitals [06/23/23 1531]  Encounter Vitals Group     BP (!) 116/58     Systolic BP Percentile      Diastolic BP Percentile      Pulse Rate 82     Resp 16     Temp 98 F (36.7 C)     Temp Source Oral     SpO2 95 %     Weight 111 lb 15.9 oz (50.8 kg)     Height 5\' 1"  (1.549 m)     Head Circumference      Peak Flow      Pain Score 4     Pain Loc      Pain Education      Exclude from Growth Chart     Most recent vital signs: Vitals:   06/23/23 1531  BP: (!) 116/58  Pulse: 82  Resp: 16  Temp: 98 F (36.7 C)  SpO2: 95%    General Awake, no distress. NAD HEENT NCAT. PERRL. EOMI. No rhinorrhea. Mucous membranes are moist.  CV:  Good peripheral perfusion. RRR RESP:  Normal effort. CTA ABD:  No distention.  Left upper quadrant stoma noted.   ED Results / Procedures / Treatments   Labs (all labs ordered are listed, but only abnormal results are displayed) Labs Reviewed - No data to display   EKG   RADIOLOGY  I personally viewed and evaluated these images as part of my medical decision making, as well as reviewing the written report by the  radiologist.  ED Provider Interpretation: PEG tube with the tip in the stomach body without extravasation of contrast  DG ABDOMEN PEG TUBE LOCATION Result Date: 06/23/2023 CLINICAL DATA:  Peg adjustment EXAM: ABDOMEN - 1 VIEW COMPARISON:  Abdominal x-ray 05/22/2023 FINDINGS: Percutaneous gastrostomy tube is present with tip likely in the body of the stomach. Contrast is seen within nondilated stomach and proximal duodenal. There are no dilated bowel loops. There is no extravasation of contrast. IMPRESSION: Percutaneous gastrostomy tube with tip likely in the body of the stomach. Electronically Signed   By: Darliss Cheney M.D.   On: 06/23/2023 19:34     PROCEDURES:  Critical Care performed: No  Procedures Successful attempted bedside replacement of a 14Fr PEG tube was performed after using urethral dilators to reestablish the stoma tract.  MEDICATIONS ORDERED IN ED: Medications  diatrizoate meglumine-sodium (GASTROGRAFIN) 66-10 % solution 30 mL (30 mLs Per Tube Given 06/23/23 1903)     IMPRESSION / MDM / ASSESSMENT AND PLAN / ED COURSE  I reviewed the triage vital signs and the nursing  notes.                              Differential diagnosis includes, but is not limited to, PEG tube dysfunction, PEG tube dislodgment, wound infection  Patient's presentation is most consistent with acute complicated illness / injury requiring diagnostic workup.  Patient's diagnosis is consistent with peg tube replacement after inadvertent dislodgment.  Patient tolerated bedside procedure replacing the PEG tube using urethral dilators for her recently established stoma and tract.  Plain from reviewed by me, reveals appropriate placement of the PEG tube without extravasation of the Gastrografin material. Patient is to follow up with her primary provider or specialist as discussed, as needed or otherwise directed. Patient is given ED precautions to return to the ED for any worsening or new symptoms.  FINAL  CLINICAL IMPRESSION(S) / ED DIAGNOSES   Final diagnoses:  PEG (percutaneous endoscopic gastrostomy) adjustment/replacement/removal (HCC)     Rx / DC Orders   ED Discharge Orders     None        Note:  This document was prepared using Dragon voice recognition software and may include unintentional dictation errors.    Lissa Hoard, PA-C 06/23/23 1939    Minna Antis, MD 06/23/23 2249

## 2023-06-23 NOTE — Discharge Instructions (Signed)
Your PEG tube has been replaced. It appears to be in place by your recent XR.

## 2023-06-23 NOTE — Telephone Encounter (Signed)
Pt says that she used her last pill eliquis and wants to know if the she needs to continue the med and I will will send in the renewal. Pt ok with that

## 2023-06-23 NOTE — ED Notes (Signed)
Entered in to assist PA to replace tube, so far unsuccessful placement

## 2023-06-23 NOTE — ED Triage Notes (Signed)
Pt here needing a feeding tube replacement. Pt having slight pain on her left side with some draining. Pt ambulatory to triage.

## 2023-06-26 DIAGNOSIS — R1310 Dysphagia, unspecified: Secondary | ICD-10-CM | POA: Diagnosis not present

## 2023-06-26 DIAGNOSIS — I480 Paroxysmal atrial fibrillation: Secondary | ICD-10-CM | POA: Diagnosis not present

## 2023-06-26 DIAGNOSIS — I2510 Atherosclerotic heart disease of native coronary artery without angina pectoris: Secondary | ICD-10-CM | POA: Diagnosis not present

## 2023-06-26 DIAGNOSIS — J449 Chronic obstructive pulmonary disease, unspecified: Secondary | ICD-10-CM | POA: Diagnosis not present

## 2023-06-26 DIAGNOSIS — E440 Moderate protein-calorie malnutrition: Secondary | ICD-10-CM | POA: Diagnosis not present

## 2023-06-26 DIAGNOSIS — I4892 Unspecified atrial flutter: Secondary | ICD-10-CM | POA: Diagnosis not present

## 2023-06-26 DIAGNOSIS — I700 Atherosclerosis of aorta: Secondary | ICD-10-CM | POA: Diagnosis not present

## 2023-06-26 DIAGNOSIS — Z431 Encounter for attention to gastrostomy: Secondary | ICD-10-CM | POA: Diagnosis not present

## 2023-06-26 DIAGNOSIS — I5032 Chronic diastolic (congestive) heart failure: Secondary | ICD-10-CM | POA: Diagnosis not present

## 2023-06-27 ENCOUNTER — Ambulatory Visit: Payer: Self-pay | Admitting: *Deleted

## 2023-06-27 ENCOUNTER — Other Ambulatory Visit: Payer: Self-pay

## 2023-06-27 DIAGNOSIS — C3492 Malignant neoplasm of unspecified part of left bronchus or lung: Secondary | ICD-10-CM

## 2023-06-27 NOTE — Patient Outreach (Signed)
 Phone call to patient to complete initial assessment. Patient requesting that appointment be re-scheduled for next week. Patient alseeo at the time if this social worker's call. Appointment re-scheduled for 07/03/23.  Florine Sprenkle, LCSW   [facility], [facility] Licensed Clinical Social Worker Care Coordinator  Direct Dial: [phone]

## 2023-06-28 ENCOUNTER — Inpatient Hospital Stay (HOSPITAL_BASED_OUTPATIENT_CLINIC_OR_DEPARTMENT_OTHER): Payer: Medicare HMO | Admitting: Oncology

## 2023-06-28 ENCOUNTER — Telehealth: Payer: Self-pay

## 2023-06-28 ENCOUNTER — Inpatient Hospital Stay: Payer: Medicare HMO

## 2023-06-28 ENCOUNTER — Encounter: Payer: Self-pay | Admitting: Oncology

## 2023-06-28 VITALS — BP 116/75 | HR 83 | Temp 99.0°F | Resp 17 | Wt 106.0 lb

## 2023-06-28 DIAGNOSIS — Z7901 Long term (current) use of anticoagulants: Secondary | ICD-10-CM

## 2023-06-28 DIAGNOSIS — E43 Unspecified severe protein-calorie malnutrition: Secondary | ICD-10-CM

## 2023-06-28 DIAGNOSIS — C3492 Malignant neoplasm of unspecified part of left bronchus or lung: Secondary | ICD-10-CM

## 2023-06-28 DIAGNOSIS — Z431 Encounter for attention to gastrostomy: Secondary | ICD-10-CM | POA: Diagnosis not present

## 2023-06-28 DIAGNOSIS — Z808 Family history of malignant neoplasm of other organs or systems: Secondary | ICD-10-CM | POA: Diagnosis not present

## 2023-06-28 DIAGNOSIS — R918 Other nonspecific abnormal finding of lung field: Secondary | ICD-10-CM | POA: Diagnosis not present

## 2023-06-28 DIAGNOSIS — I700 Atherosclerosis of aorta: Secondary | ICD-10-CM | POA: Diagnosis not present

## 2023-06-28 DIAGNOSIS — Z87891 Personal history of nicotine dependence: Secondary | ICD-10-CM | POA: Diagnosis not present

## 2023-06-28 DIAGNOSIS — I4892 Unspecified atrial flutter: Secondary | ICD-10-CM | POA: Diagnosis not present

## 2023-06-28 DIAGNOSIS — I5032 Chronic diastolic (congestive) heart failure: Secondary | ICD-10-CM | POA: Diagnosis not present

## 2023-06-28 DIAGNOSIS — E538 Deficiency of other specified B group vitamins: Secondary | ICD-10-CM

## 2023-06-28 DIAGNOSIS — Z23 Encounter for immunization: Secondary | ICD-10-CM

## 2023-06-28 DIAGNOSIS — R1310 Dysphagia, unspecified: Secondary | ICD-10-CM | POA: Diagnosis not present

## 2023-06-28 DIAGNOSIS — Z853 Personal history of malignant neoplasm of breast: Secondary | ICD-10-CM | POA: Diagnosis not present

## 2023-06-28 DIAGNOSIS — I2699 Other pulmonary embolism without acute cor pulmonale: Secondary | ICD-10-CM | POA: Diagnosis not present

## 2023-06-28 DIAGNOSIS — J449 Chronic obstructive pulmonary disease, unspecified: Secondary | ICD-10-CM | POA: Diagnosis not present

## 2023-06-28 DIAGNOSIS — I2510 Atherosclerotic heart disease of native coronary artery without angina pectoris: Secondary | ICD-10-CM | POA: Diagnosis not present

## 2023-06-28 DIAGNOSIS — Z8521 Personal history of malignant neoplasm of larynx: Secondary | ICD-10-CM | POA: Diagnosis not present

## 2023-06-28 DIAGNOSIS — Z9221 Personal history of antineoplastic chemotherapy: Secondary | ICD-10-CM | POA: Diagnosis not present

## 2023-06-28 DIAGNOSIS — E440 Moderate protein-calorie malnutrition: Secondary | ICD-10-CM | POA: Diagnosis not present

## 2023-06-28 DIAGNOSIS — Z923 Personal history of irradiation: Secondary | ICD-10-CM | POA: Diagnosis not present

## 2023-06-28 DIAGNOSIS — I480 Paroxysmal atrial fibrillation: Secondary | ICD-10-CM | POA: Diagnosis not present

## 2023-06-28 LAB — CBC WITH DIFFERENTIAL (CANCER CENTER ONLY)
Abs Immature Granulocytes: 0.03 10*3/uL (ref 0.00–0.07)
Basophils Absolute: 0 10*3/uL (ref 0.0–0.1)
Basophils Relative: 0 %
Eosinophils Absolute: 0.2 10*3/uL (ref 0.0–0.5)
Eosinophils Relative: 4 %
HCT: 40.1 % (ref 36.0–46.0)
Hemoglobin: 12.9 g/dL (ref 12.0–15.0)
Immature Granulocytes: 1 %
Lymphocytes Relative: 11 %
Lymphs Abs: 0.7 10*3/uL (ref 0.7–4.0)
MCH: 32.4 pg (ref 26.0–34.0)
MCHC: 32.2 g/dL (ref 30.0–36.0)
MCV: 100.8 fL — ABNORMAL HIGH (ref 80.0–100.0)
Monocytes Absolute: 0.4 10*3/uL (ref 0.1–1.0)
Monocytes Relative: 7 %
Neutro Abs: 5 10*3/uL (ref 1.7–7.7)
Neutrophils Relative %: 77 %
Platelet Count: 208 10*3/uL (ref 150–400)
RBC: 3.98 MIL/uL (ref 3.87–5.11)
RDW: 13 % (ref 11.5–15.5)
WBC Count: 6.5 10*3/uL (ref 4.0–10.5)
nRBC: 0 % (ref 0.0–0.2)

## 2023-06-28 LAB — CMP (CANCER CENTER ONLY)
ALT: 11 U/L (ref 0–44)
AST: 21 U/L (ref 15–41)
Albumin: 3.7 g/dL (ref 3.5–5.0)
Alkaline Phosphatase: 76 U/L (ref 38–126)
Anion gap: 9 (ref 5–15)
BUN: 24 mg/dL — ABNORMAL HIGH (ref 8–23)
CO2: 26 mmol/L (ref 22–32)
Calcium: 9 mg/dL (ref 8.9–10.3)
Chloride: 101 mmol/L (ref 98–111)
Creatinine: 0.8 mg/dL (ref 0.44–1.00)
GFR, Estimated: >60 mL/min (ref >60)
Glucose, Bld: 96 mg/dL (ref 70–99)
Potassium: 4.9 mmol/L (ref 3.5–5.1)
Sodium: 136 mmol/L (ref 135–145)
Total Bilirubin: 1 mg/dL (ref 0.0–1.2)
Total Protein: 6.8 g/dL (ref 6.5–8.1)

## 2023-06-28 MED ORDER — CYANOCOBALAMIN 1000 MCG/ML IJ SOLN
1000.0000 ug | Freq: Once | INTRAMUSCULAR | Status: AC
Start: 2023-06-28 — End: 2023-06-28
  Administered 2023-06-28: 1000 ug via INTRAMUSCULAR
  Filled 2023-06-28: qty 1

## 2023-06-28 MED ORDER — NUTREN 1.5 EN LIQD: ENTERAL | Status: AC

## 2023-06-28 NOTE — Progress Notes (Signed)
 Nutrition Follow-up:  Patient with non small cell lung cancer.   Hospitalized 1/21-06/04/22 with PEG placed on 05/22/22 during admission because of dyphagia, aspiration pneumonia.   Sent to [facility] following discharge.  PEG replaced on 2/21 in ED (14 Fr). No treatment currently  Met with patient and husband following MD visit.  Reports that her grand-daughter is giving her a 1 carton of nutren?? In the morning and 1 carton in the evening.  Says that she never received any tube feeding at [facility] because they could not get the pump to work.  She left after 4 days and came home.  Said that SNF was a waste of time.  Says that grand-daughter gives about 1/3rd of syringe of water before and after feeding feeding and with medications at 8:30am and between 6-7pm.    Patient able to take bites of liquid oral intake (soups, mashed potatoes). Overall oral intake is decreased because of dsyphagia.   Uses blender as needed.  Reports bowel movement every am (loose but not diarrhea).    Inpatient RD notes reviewed and patient was on 15ml/hr of osmolite 1.5 (1800-1000) prior to discharge.    Anticipate long term need for feeding.  Medications: reviewed  Labs: reviewed  Anthropometrics:   Weight 106 lb today in clinic, decreased  111 lb on 2/21 117 lb on 2/3 119 lb 4.3 oz on 1/28 112 lb on 10.5 oz 1/22  9% weight loss in the last 3 weeks, significant  Estimated Energy Needs  Kcals: 1500-1750 Protein: 50-60 g Fluid: 1500-1750 ml  NUTRITION DIAGNOSIS: Inadequate oral intake related to dysphagia as evidenced by PEG placement   MALNUTRITION DIAGNOSIS: Moderate malnutrition continues with weight loss   INTERVENTION:  Recommend patient increase tube feeding to at least 4 cartons of nutren 1.5 daily to better meet nutritional needs as decreased oral intake and weight loss since tube being placed.  Flush with 60ml of water (1 syringe) before and after each feeding QID.  Written instructions  given to patient.  She reports that she can give tube feeding by syringe.  Observed patient give water flush today in clinic.  Tube worked well.  PEG site was bandaged. Patient reports no issues at the site.  Provides 1500 calories, 68 g protein and 1244 ml free water ( total).  Will need additional 1 1/2 cups of water orally or via tube to better meet hydration needs.  Meets 100% of needs.  Patient does not know if she is set up with DME to provide enteral supplies.  RD will reach out to see if she is currently set up with local company.  If patient is not set up with DME will get patient set up.  Contact number provided  MONITORING, EVALUATION, GOAL: weight trends, tube feeding   NEXT VISIT: Wednesday, March 26 in clinic  Cledith Kamiya B. Freida Busman, RD, LDN Registered Dietitian [phone]

## 2023-06-28 NOTE — Telephone Encounter (Signed)
 Copied from CRM [id number]. Topic: Clinical - Prescription Issue >> Jun 28, 2023  3:51 PM Martha Clan wrote: Reason for CRM: sucralfate (CARAFATE) 1 GM/10ML suspension [[id number]] lactulose (CHRONULAC) 10 GM/15ML solution [[id number]] Pharmacy wont fill prescription without doctor's okay

## 2023-06-28 NOTE — Progress Notes (Unsigned)
 Patient here for oncology follow-up appointment, concerns of nausea and dizziness

## 2023-06-29 ENCOUNTER — Telehealth: Payer: Self-pay

## 2023-06-29 DIAGNOSIS — I480 Paroxysmal atrial fibrillation: Secondary | ICD-10-CM | POA: Diagnosis not present

## 2023-06-29 DIAGNOSIS — I4892 Unspecified atrial flutter: Secondary | ICD-10-CM | POA: Diagnosis not present

## 2023-06-29 DIAGNOSIS — I5032 Chronic diastolic (congestive) heart failure: Secondary | ICD-10-CM | POA: Diagnosis not present

## 2023-06-29 DIAGNOSIS — R1310 Dysphagia, unspecified: Secondary | ICD-10-CM | POA: Diagnosis not present

## 2023-06-29 DIAGNOSIS — I2510 Atherosclerotic heart disease of native coronary artery without angina pectoris: Secondary | ICD-10-CM | POA: Diagnosis not present

## 2023-06-29 DIAGNOSIS — I700 Atherosclerosis of aorta: Secondary | ICD-10-CM | POA: Diagnosis not present

## 2023-06-29 DIAGNOSIS — J449 Chronic obstructive pulmonary disease, unspecified: Secondary | ICD-10-CM | POA: Diagnosis not present

## 2023-06-29 DIAGNOSIS — Z431 Encounter for attention to gastrostomy: Secondary | ICD-10-CM | POA: Diagnosis not present

## 2023-06-29 DIAGNOSIS — E440 Moderate protein-calorie malnutrition: Secondary | ICD-10-CM | POA: Diagnosis not present

## 2023-06-29 NOTE — Progress Notes (Signed)
 Nutrition  RD received message that Denise Watts is sending patient enteral supplies.  They will reach out to patient to get new order.    Called patient to let her know above information.  No answer but left message on voicemail with my call back number if needed.  Denise Watts B. Freida Busman, RD, LDN Registered Dietitian [phone]

## 2023-06-29 NOTE — Telephone Encounter (Signed)
Please schedule patient for an appointment for evaluation.

## 2023-06-29 NOTE — Telephone Encounter (Signed)
 Copied from CRM [id number]. Topic: Clinical - Home Health Verbal Orders >> Jun 29, 2023  8:22 AM Jannette Spanner L wrote: Caller/Agency: Junious Dresser Occupational Therapist with [facility] Callback Number: [phone] Service Requested: Occupational Therapy Frequency: OT once a week for 8 weeks  Any new concerns about the patient? No

## 2023-06-29 NOTE — Telephone Encounter (Signed)
 Copied from CRM [id number]. Topic: Clinical - Medical Advice >> Jun 29, 2023 10:34 AM Shelah Lewandowsky wrote: Reason for CRM: Harvie Heck with [facility]- saw patient today and she has wheezing in upper and lower lobes - [phone]

## 2023-06-30 ENCOUNTER — Encounter: Payer: Self-pay | Admitting: Oncology

## 2023-06-30 MED ORDER — LACTULOSE 10 GM/15ML PO SOLN: 20.0000 g | Freq: Two times a day (BID) | ORAL | 0 refills | Status: DC

## 2023-06-30 MED ORDER — SUCRALFATE 1 GM/10ML PO SUSP: 1.0000 g | Freq: Four times a day (QID) | ORAL | 1 refills | Status: DC

## 2023-06-30 NOTE — Addendum Note (Signed)
 Addended by: Creig Hines on: 06/30/2023 08:55 AM   Modules accepted: Level of Service

## 2023-06-30 NOTE — Addendum Note (Signed)
 Addended by: Jacquenette Shone on: 06/30/2023 07:13 AM   Modules accepted: Orders

## 2023-06-30 NOTE — Progress Notes (Signed)
 Hematology/Oncology Consult note [facility]  [phone] [phone]  Patient Care Team: Sherlyn Hay, DO as PCP - General (Family Medicine) Debbe Odea, MD as PCP - Cardiology (Cardiology) Creig Hines, MD as Consulting Physician (Oncology) Leafy Ro, MD as Consulting Physician (General Surgery) Dasher, Cliffton Asters, MD (Dermatology) Scheeler, Kermit Balo, PA-C as Physician Assistant (Plastic Surgery) Minna Antis, MD as Attending Physician (Emergency Medicine) Alvira Philips, Leitha Bleak (Neurology) Dayna Barker, MD as Referring Physician (Internal Medicine) Coralyn Helling, MD (Inactive) as Consulting Physician (Pulmonary Disease) Gaspar Cola, RPH (Inactive) (Pharmacist) Pa, [facility] (Optometry) Glory Buff, RN as Oncology Nurse Navigator Carmina Miller, MD as Consulting Physician (Radiation Oncology) Redge Gainer, RN as [facility] Care Management   Name of the patient: Denise Watts  [id number]  [birth year]   Date of visit: 06/30/23   Diagnosis-non-small cell carcinoma of unknown primary  Chief complaint/ Reason for visit-posthospital discharge follow-up    Heme/Onc history:  Oncology History Overview Note  1. Carcinoma of breast, T1, N0, M0 tumor diagnosis.  In January of 1997 2. Carcinoma of the vocal cord T1, N0, M0 tumor.  Status postradiation therapy 3. Abnormal CT scan of the chest (December, 2015) 4. Repeat CT scan of chest shows stable nodule (July, 2016) 5. Neutropenia with normal hemoglobin and normal platelet count (July, 2016)   Cancer of vocal cord [facility]) (Resolved)  11/10/2014 Initial Diagnosis   Cancer of vocal cord   Malignant neoplasm of lower-outer quadrant of right breast of female, estrogen receptor negative (HCC)  11/24/2017 Initial Diagnosis   Malignant neoplasm of lower-outer quadrant of right breast of female, estrogen receptor negative (HCC)   01/12/2018 Cancer  Staging   Staging form: Breast, AJCC 8th Edition - Clinical stage from 01/12/2018: Stage IB (cT1a, cN0, cM0, G3, ER+, PR-, HER2-) - Signed by Creig Hines, MD on 01/12/2018   01/30/2018 - 04/26/2018 Chemotherapy   The patient had dexamethasone (DECADRON) 4 MG tablet, 8 mg, Oral, 2 times daily, 1 of 1 cycle, Start date: 01/12/2018, End date: 08/03/2018 palonosetron (ALOXI) injection 0.25 mg, 0.25 mg, Intravenous,  Once, 4 of 4 cycles Administration: 0.25 mg (01/30/2018), 0.25 mg (02/27/2018), 0.25 mg (03/28/2018), 0.25 mg (04/26/2018) pegfilgrastim (NEULASTA ONPRO KIT) injection 6 mg, 6 mg, Subcutaneous, Once, 4 of 4 cycles Administration: 6 mg (01/30/2018), 6 mg (02/27/2018), 6 mg (03/28/2018), 6 mg (04/26/2018) cyclophosphamide (CYTOXAN) 1,000 mg in sodium chloride 0.9 % 250 mL chemo infusion, 600 mg/m2 = 1,000 mg, Intravenous,  Once, 4 of 4 cycles Administration: 1,000 mg (01/30/2018), 1,000 mg (02/27/2018), 1,000 mg (03/28/2018), 1,000 mg (04/26/2018) DOCEtaxel (TAXOTERE) 130 mg in sodium chloride 0.9 % 250 mL chemo infusion, 75 mg/m2 = 130 mg, Intravenous,  Once, 4 of 4 cycles Dose modification: 60 mg/m2 (original dose 75 mg/m2, Cycle 2, Reason: Dose not tolerated) Administration: 130 mg (01/30/2018), 100 mg (02/27/2018), 100 mg (03/28/2018), 100 mg (04/26/2018)  for chemotherapy treatment.    Non-small cell lung cancer (HCC)  03/09/2023 Initial Diagnosis   Non-small cell lung cancer (HCC)   03/20/2023 -  Chemotherapy   Patient is on Treatment Plan : LUNG Carboplatin + Paclitaxel q7d       The patient was originally diagnosed with right breast cancer in 1997, 10 mm grade 3, ER pr positive her 2 negative, node negative status post surgery and radiation treatment only. She never received chemotherapy or hormone therapy. For her vocal cord cancer, she had extensive surgery and radiation therapy  in 1998. Due to past history of smoking, she was also noted to have lung nodules. She is undergoing  active surveillance imaging study of the chest.   Patient noted to have adrenal mass on CT chest which prompted PET CT in July 2018 which showed: IMPRESSION: 1. Right adrenal mass with peripheral hypermetabolism. Given interval development since 11/18/2015, suspicious for either isolated metastasis or a primary adrenal neoplasm. This should be considered for biopsy. 2. No evidence of hypermetabolic primary malignancy or extraadrenal metastasis. Pulmonary nodules are not significantly hypermetabolic. 3. Coronary artery atherosclerosis. Aortic Atherosclerosis (ICD10-I70.0). 4. Hepatic steatosis. 5. Left sixth rib hypermetabolism is favored to be related to remote Trauma.   Patient underwent right adrenalectomy which showed: DIAGNOSIS:  A. ADRENAL GLAND, RIGHT; ADRENALECTOMY:  - HIGH GRADE CARCINOMA WITH EXTENSIVE NECROSIS.  - MARGINS OF THE SPECIMEN ARE NEGATIVE FOR CARCINOMA.   Comment:  Sections demonstrate abundant necrosis confined to the adrenal medullary  compartment. In one block of tissue a small fragment of viable malignant  neoplasm was identified. A panel of immunohistochemical stains was  performed with the following pattern of immunoreactivity:  Super pancytokeratin: Positive  GATA-3: Negative  TTF-1: Negative  P40: Negative  Napsin: Negative  PAX-8: Negative  Chromogranin: Negative     Repeat CT chest abdomen and pelvis in July 2019 showed stable left lower lobe pulmonary nodule.  No other evidence of metastatic disease.  New 4.5 mm nodule in the right breast.  This was followed by ultrasound mammogram and core biopsy of the breast lesion which was a grade 3 triple negative breast cancer.  Patient was seen by Dr. Lemar Livings and underwent lumpectomy on 12/08/2017.   Patient had a 5 mm weakly ER positive tumor.  Given her ongoing fatigue Oncotype testing was done to see if she could potentially avoid chemotherapy.  Oncotype shows a recurrence score of 53 with her risk of  distant recurrence at 9 years at greater than 39% with hormone therapy alone.  Absolute benefit of chemotherapy was greater than 15% in her age group.  Patient completed 4 cycles of adjuvant TC chemotherapy in December 2019    Patient has known bilateral pulmonary nodules which have been monitored and she was again found to have a left lower lobe lung nodule measuring 13 mm unchanged as compared to prior exams.  There were also new subcentimeter lung nodules detected and follow-up CT was recommended.  CT angio chest in August 2024 showed increase in the size of left upper lobe lung nodule and new multiple pulmonary nodules concerning for metastatic disease.  This was followed by a PET CT scan which showed hypermetabolic intrathoracic nodal adenopathy measuring 5 to 13 mm in size along with lung nodules with a maximum size measuring 13 mm with an SUV of 9.2.   CT-guided biopsy was nondiagnostic she is patient underwent bronchoscopy but lymph nodes were not sampled at that time due to equipment malfunction.Bronchial lavage from left upper lobe was positive for malignancy non-small cell carcinoma.  But no further subclassification whether this is primary lung cancer or metastatic carcinoma could be done due to limited specimen. EBUS station 7 LN also shows non small cell carcinoma   Patient underwent concurrent chemoradiation with weekly CarboTaxol which she tolerated poorly.  Patient currently received 4 cycles of chemotherapy due to progressive fatigue and poor oral intake  Interval history-patient presently has a feeding tube in place and is keeping up with her nutrition through that.  Energy levels are better although not  back to her baseline  ECOG PS- 2 Pain scale- 4   Review of systems- Review of Systems  Constitutional:  Positive for malaise/fatigue. Negative for chills, fever and weight loss.  HENT:  Negative for congestion, ear discharge and nosebleeds.   Eyes:  Negative for blurred vision.   Respiratory:  Negative for cough, hemoptysis, sputum production, shortness of breath and wheezing.   Cardiovascular:  Negative for chest pain, palpitations, orthopnea and claudication.  Gastrointestinal:  Negative for abdominal pain, blood in stool, constipation, diarrhea, heartburn, melena, nausea and vomiting.  Genitourinary:  Negative for dysuria, flank pain, frequency, hematuria and urgency.  Musculoskeletal:  Positive for back pain and myalgias. Negative for joint pain.  Skin:  Negative for rash.  Neurological:  Negative for dizziness, tingling, focal weakness, seizures, weakness and headaches.  Endo/Heme/Allergies:  Does not bruise/bleed easily.  Psychiatric/Behavioral:  Negative for depression and suicidal ideas. The patient does not have insomnia.       Allergies  Allergen Reactions   Sulfa Antibiotics Other (See Comments)    Mouth sore and raw     Past Medical History:  Diagnosis Date   Acute pulmonary embolism (HCC) 05/10/2023   Adenomatous colon polyp    Adrenal carcinoma, right (HCC) 12/28/2016   a.) high grade carcinoma with extensive necrosis --> s/p adrenalectomy   Anxiety    Aortic atherosclerosis (HCC)    Arthritis of right knee    Atrial fibrillation and flutter (HCC)    a.) CHA2DS2-VASc = 3 (age, sex, vascular disease history) as of 02/13/2023; b.) cardiac rate/rhythm maintained intrinsically without pharmacological intervention; no chronic OAC (does take low dose ASA)   B12 deficiency    Breast cancer (HCC) 1997   a.) stage IB (cT1a, cN0, cM0, G3, ER+, PR-, HER2-); s/p lumpectomy + XRT   COPD (chronic obstructive pulmonary disease) (HCC)    Coronary artery disease    a.) cCTA 12/09/2021: Ca2+ = 135 (80th %'ile for age/sex/race match contol) --> 25-49% pLAD and RCA distributions   Depression    Diastolic dysfunction    a.) TTE 12/11/2020, no RWMAs, norm RVSF, G1DD; b.) TTE 12/14/2021: EF 55-60, no RWMAs, norm RVSF, G1DD; c.) TTE 01/27/2023: EF 50-55%, no  RWMAs, G1DD, mild RAE, mild MR, mod TR   Dry cough 03/02/2017   Dyspnea    Full dentures    GERD (gastroesophageal reflux disease)    History of hiatal hernia    HLD (hyperlipidemia)    Invasive carcinoma of breast (HCC) 11/15/2017   a.) path (+) for G3, ER -, PR -, HER2/neu - ; s/p lumpectomy 01/17/2018 + 4 cycles TC systemic chemotherapy + XRT   Long term current use of aspirin    Personal history of chemotherapy    Personal history of radiation therapy    Pneumothorax of left lung after biopsy 01/25/2023   a.) hypoxic in PACU requiring re-intubation --> transferred to ICU --> chest tube placed by PCCM   RLS (restless legs syndrome)    a.) on ropinirole   Skin cancer    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   a.) Stage IB (cT1a, cN0, cM0, G3, ER+, PR-, HER2-)     Past Surgical History:  Procedure Laterality Date   BIOPSY  01/05/2023   Procedure: BIOPSY;  Surgeon: Toney Reil, MD;  Location: [facility] ENDOSCOPY;  Service: Gastroenterology;;   BREAST BIOPSY Right 1997   positive   BREAST BIOPSY Right 11/15/2017   INVASIVE MAMMARY CARCINOMA triple negative  BREAST BIOPSY Right 07/20/2020   [place] bx, Q marker, neg   BREAST LUMPECTOMY Right 1997   2019 also   BREAST LUMPECTOMY WITH SENTINEL LYMPH NODE BIOPSY Right 12/08/2017   Procedure: BREAST LUMPECTOMY WITH SENTINEL LYMPH NODE BX;  Surgeon: Earline Mayotte, MD;  Location: [facility] ORS;  Service: General;  Laterality: Right;   BREAST REDUCTION WITH MASTOPEXY Left 06/10/2019   Procedure: Left breast mastopexy reduction for symmetry;  Surgeon: Peggye Form, DO;  Location: [facility];  Service: Plastics;  Laterality: Left;  total 2.5 hours   CATARACT EXTRACTION W/PHACO Right 02/07/2022   Procedure: CATARACT EXTRACTION PHACO AND INTRAOCULAR LENS PLACEMENT (IOC) RIGHT DIABETIC;  Surgeon: Nevada Crane, MD;  Location: [facility];  Service: Ophthalmology;  Laterality: Right;  3.60 00:34.7    CATARACT EXTRACTION W/PHACO Left 02/28/2022   Procedure: CATARACT EXTRACTION PHACO AND INTRAOCULAR LENS PLACEMENT (IOC) LEFT DIABETIC 4.11 00:26.1;  Surgeon: Nevada Crane, MD;  Location: [facility];  Service: Ophthalmology;  Laterality: Left;   COLONOSCOPY  2015   COLONOSCOPY N/A 01/05/2023   Procedure: COLONOSCOPY;  Surgeon: Toney Reil, MD;  Location: [facility] ENDOSCOPY;  Service: Gastroenterology;  Laterality: N/A;   COLONOSCOPY WITH PROPOFOL N/A 10/29/2019   Procedure: COLONOSCOPY WITH PROPOFOL;  Surgeon: Midge Minium, MD;  Location: [facility] ENDOSCOPY;  Service: Endoscopy;  Laterality: N/A;   ESOPHAGOGASTRODUODENOSCOPY (EGD) WITH PROPOFOL N/A 05/24/2019   Procedure: ESOPHAGOGASTRODUODENOSCOPY (EGD) WITH PROPOFOL;  Surgeon: Midge Minium, MD;  Location: [facility] ENDOSCOPY;  Service: Endoscopy;  Laterality: N/A;   ESOPHAGOGASTRODUODENOSCOPY (EGD) WITH PROPOFOL N/A 09/06/2022   Procedure: ESOPHAGOGASTRODUODENOSCOPY (EGD) WITH PROPOFOL;  Surgeon: Midge Minium, MD;  Location: [facility] END[facility];  Service: Endoscopy;  Laterality: N/A;   ESOPHAGOGASTRODUODENOSCOPY (EGD) WITH PROPOFOL  01/05/2023   Procedure: ESOPHAGOGASTRODUODENOSCOPY (EGD) WITH PROPOFOL;  Surgeon: Toney Reil, MD;  Location: [facility];  Service: Gastroenterology;;   ESOPHAGUS SURGERY     GASTROSTOMY N/A 05/25/2023   Procedure: OPEN INSERTION OF GASTROSTOMY TUBE;  Surgeon: Leafy Ro, MD;  Location: [facility] ORS;  Service: General;  Laterality: N/A;   IR IMAGING GUIDED PORT INSERTION  02/07/2023   KNEE SURGERY Right    LIPOSUCTION WITH LIPOFILLING Right 06/10/2019   Procedure: right breast scar and capsule release with fat grafting;  Surgeon: Peggye Form, DO;  Location: [facility];  Service: Plastics;  Laterality: Right;   POLYPECTOMY  01/05/2023   Procedure: POLYPECTOMY;  Surgeon: Toney Reil, MD;  Location: [facility] ENDOSCOPY;  Service: Gastroenterology;;   [facility] REMOVAL      PORTACATH PLACEMENT Right 01/17/2018   Procedure: INSERTION PORT-A-CATH- RIGHT;  Surgeon: Earline Mayotte, MD;  Location: [facility]S;  Service: General;  Laterality: Right;   PULMONARY THROMBECTOMY Bilateral 05/12/2023   Procedure: PULMONARY THROMBECTOMY;  Surgeon: Annice Needy, MD;  Location: [facility] INV[facility];  Service: Cardiovascular;  Laterality: Bilateral;   ROBOTIC ADRENALECTOMY Right 12/28/2016   Procedure: ROBOTIC ADRENALECTOMY;  Surgeon: Vanna Scotland, MD;  Location: [facility] ORS;  Service: Urology;  Laterality: Right;   SKIN CANCER EXCISION     THROAT SURGERY  1998   throat cancer    TUBAL LIGATION     VENTRAL HERNIA REPAIR N/A 03/03/2017   Procedure: HERNIA REPAIR VENTRAL ADULT;  Surgeon: Ricarda Frame, MD;  Location: [facility] ORS;  Service: General;  Laterality: N/A;   VIDEO BRONCHOSCOPY WITH ENDOBRONCHIAL ULTRASOUND N/A 01/25/2023   Procedure: VIDEO BRONCHOSCOPY WITH ENDOBRONCHIAL ULTRASOUND;  Surgeon: Vida Rigger, MD;  Location: [facility]  ORS;  Service: Thoracic;  Laterality: N/A;   VIDEO BRONCHOSCOPY WITH ENDOBRONCHIAL ULTRASOUND N/A 02/17/2023   Procedure: VIDEO BRONCHOSCOPY WITH ENDOBRONCHIAL ULTRASOUND;  Surgeon: Vida Rigger, MD;  Location: [facility] ORS;  Service: Thoracic;  Laterality: N/A;    Social History   Socioeconomic History   Marital status: Married    Spouse name: Laityn, Bensen (Spouse) [phone] ([place])   Number of children: 4   Years of education: Not on file   Highest education level: 10th grade  Occupational History   Occupation: retired  Tobacco Use   Smoking status: Former    Current packs/day: 0.00    Average packs/day: 1.5 packs/day for 30.0 years (45.0 ttl pk-yrs)    Types: Cigarettes    Start date: 05/02/1965    Quit date: 05/03/1995    Years since quitting: 28.1    Passive exposure: Past   Smokeless tobacco: Never  Vaping Use   Vaping status: Never Used  Substance and Sexual Activity   Alcohol use: Not Currently    Alcohol/week: 4.0  standard drinks of alcohol    Types: 4 Shots of liquor per week    Comment: none   Drug use: No   Sexual activity: Not Currently    Birth control/protection: Post-menopausal  Other Topics Concern   Not on file  Social History Narrative   Patient from home with husband.   Social Drivers of Corporate investment banker Strain: Low Risk  (05/19/2023)   Overall Financial Resource Strain (CARDIA)    Difficulty of Paying Living Expenses: Not very hard  Food Insecurity: No Food Insecurity (05/24/2023)   Hunger Vital Sign    Worried About Running Out of Food in the Last Year: Never true    Ran Out of Food in the Last Year: Never true  Transportation Needs: No Transportation Needs (05/24/2023)   PRAPARE - Administrator, Civil Service (Medical): No    Lack of Transportation (Non-Medical): No  Physical Activity: Inactive (05/19/2023)   Exercise Vital Sign    Days of Exercise per Week: 0 days    Minutes of Exercise per Session: 0 min  Stress: Stress Concern Present (05/19/2023)   [employer] of Occupational Health - Occupational Stress Questionnaire    Feeling of Stress : To some extent  Social Connections: Moderately Isolated (05/24/2023)   Social Connection and Isolation Panel [NHANES]    Frequency of Communication with Friends and Family: More than three times a week    Frequency of Social Gatherings with Friends and Family: More than three times a week    Attends Religious Services: Never    Database administrator or Organizations: No    Attends Banker Meetings: Never    Marital Status: Married  Catering manager Violence: Not At Risk (05/24/2023)   Humiliation, Afraid, Rape, and Kick questionnaire    Fear of Current or Ex-Partner: No    Emotionally Abused: No    Physically Abused: No    Sexually Abused: No    Family History  Problem Relation Age of Onset   Diabetes Mother    Heart attack Mother    Cancer Sister        cancer of eyes, spread to lung,  other places. died at 62   Cancer Sister 26       unknown primary, spread to bone, brain died in 25's   Cancer Paternal Aunt        unk type   Cancer Paternal Grandmother  type unk   Prostate cancer Neg Hx    Kidney cancer Neg Hx    Bladder Cancer Neg Hx    Breast cancer Neg Hx      Current Outpatient Medications:    apixaban (ELIQUIS) 5 MG TABS tablet, Take 1 tablet (5 mg total) by mouth 2 (two) times daily., Disp: 60 tablet, Rfl: 3   benzonatate (TESSALON) 100 MG capsule, Take 2 capsules (200 mg total) by mouth 3 (three) times daily as needed for cough., Disp: , Rfl:    bisacodyl (DULCOLAX) 10 MG suppository, Place 1 suppository (10 mg total) rectally daily as needed for moderate constipation or severe constipation., Disp: , Rfl:    busPIRone (BUSPAR) 5 MG tablet, Take 1 tablet (5 mg total) by mouth 3 (three) times daily as needed., Disp: 90 tablet, Rfl: 0   dextromethorphan-guaiFENesin (MUCINEX DM) 30-600 MG 12hr tablet, Take 1 tablet by mouth 2 (two) times daily as needed for cough., Disp: , Rfl:    fentaNYL (DURAGESIC) 12 MCG/HR, Place 1 patch onto the skin every 3 (three) days., Disp: 10 patch, Rfl: 0   lidocaine (LIDODERM) 5 %, Place 1 patch onto the skin daily. Remove & Discard patch within 12 hours or as directed by MD, Disp: , Rfl:    meloxicam (MOBIC) 7.5 MG tablet, Take 1 tablet (7.5 mg total) by mouth daily., Disp: 30 tablet, Rfl: 0   metoprolol succinate (TOPROL-XL) 25 MG 24 hr tablet, Take 1 tablet (25 mg total) by mouth daily., Disp: , Rfl:    omeprazole (KONVOMEP) 2 mg/mL SUSP oral suspension, Take 20 mLs (40 mg total) by mouth daily., Disp: 240 mL, Rfl: 1   ondansetron (ZOFRAN) 4 MG tablet, Take 1 tablet (4 mg total) by mouth every 6 (six) hours as needed for nausea or vomiting., Disp: , Rfl:    oxyCODONE 7.5 MG TABS, Place 7.5 mg into feeding tube every 4 (four) hours as needed for severe pain (pain score 7-10) or moderate pain (pain score 4-6)., Disp: 120 tablet,  Rfl: 0   rOPINIRole (REQUIP) 0.25 MG tablet, Place 1 tablet (0.25 mg total) into feeding tube at bedtime., Disp: , Rfl:    rosuvastatin (CRESTOR) 40 MG tablet, Place 1 tablet (40 mg total) into feeding tube daily., Disp: , Rfl:    senna (SENOKOT) 8.6 MG TABS tablet, Place 1 tablet (8.6 mg total) into feeding tube daily., Disp: , Rfl:    simethicone (MYLICON) 80 MG chewable tablet, Place 1 tablet (80 mg total) into feeding tube 4 (four) times daily., Disp: , Rfl:    triamcinolone cream (KENALOG) 0.1 %, Apply 1 Application topically 2 (two) times daily., Disp: , Rfl:    Water For Irrigation, Sterile (FREE WATER) SOLN, Place 60 mLs into feeding tube 5 (five) times daily., Disp: , Rfl:    lactulose (CHRONULAC) 10 GM/15ML solution, Place 30 mLs (20 g total) into feeding tube 2 (two) times daily., Disp: 946 mL, Rfl: 0   Nutritional Supplements (NUTREN 1.5) LIQD, Give 1 carton 4 times a day (8am, noon, 4pm and 8pm). Flush with 60ml of water before and after each feeding.  Give additional 1 1/2 cups of water a day via tube or drink orally for adequate hydration., Disp: , Rfl:    sucralfate (CARAFATE) 1 GM/10ML suspension, Take 10 mLs (1 g total) by mouth 4 (four) times daily., Disp: 420 mL, Rfl: 1 No current facility-administered medications for this visit.  Facility-Administered Medications Ordered in Other Visits:  cyanocobalamin ((VITAMIN B-12)) injection 1,000 mcg, 1,000 mcg, Intramuscular, Q30 days, Creig Hines, MD, 1,000 mcg at 08/27/21 1537   cyanocobalamin (VITAMIN B12) injection 1,000 mcg, 1,000 mcg, Intramuscular, Q30 days, Creig Hines, MD, 1,000 mcg at 03/16/22 1101  Physical exam:  Vitals:   06/28/23 1316 06/28/23 1317  BP:  116/75  Pulse: 83   Resp: 17   Temp: 99 F (37.2 C)   SpO2: 99%   Weight: 106 lb (48.1 kg)    Physical Exam Constitutional:      Comments: Frail woman sitting in a wheelchair.  Appears in no acute distress  Cardiovascular:     Rate and Rhythm: Regular  rhythm. Tachycardia present.     Heart sounds: Normal heart sounds.  Pulmonary:     Effort: Pulmonary effort is normal.     Breath sounds: Normal breath sounds.  Abdominal:     General: Bowel sounds are normal.     Palpations: Abdomen is soft.     Comments: Feeding tube in place  Skin:    General: Skin is warm and dry.  Neurological:     Mental Status: She is alert and oriented to person, place, and time.         Latest Ref Rng & Units 06/28/2023   12:54 PM  CMP  Glucose 70 - 99 mg/dL 96   BUN 8 - 23 mg/dL 24   Creatinine 5.78 - 1.00 mg/dL 4.69   Sodium 629 - 528 mmol/L 136   Potassium 3.5 - 5.1 mmol/L 4.9   Chloride 98 - 111 mmol/L 101   CO2 22 - 32 mmol/L 26   Calcium 8.9 - 10.3 mg/dL 9.0   Total Protein 6.5 - 8.1 g/dL 6.8   Total Bilirubin 0.0 - 1.2 mg/dL 1.0   Alkaline Phos 38 - 126 U/L 76   AST 15 - 41 U/L 21   ALT 0 - 44 U/L 11       Latest Ref Rng & Units 06/28/2023   12:54 PM  CBC  WBC 4.0 - 10.5 K/uL 6.5   Hemoglobin 12.0 - 15.0 g/dL 41.3   Hematocrit 24.4 - 46.0 % 40.1   Platelets 150 - 400 K/uL 208     No images are attached to the encounter.  DG ABDOMEN PEG TUBE LOCATION Result Date: 06/23/2023 CLINICAL DATA:  Peg adjustment EXAM: ABDOMEN - 1 VIEW COMPARISON:  Abdominal x-ray 05/22/2023 FINDINGS: Percutaneous gastrostomy tube is present with tip likely in the body of the stomach. Contrast is seen within nondilated stomach and proximal duodenal. There are no dilated bowel loops. There is no extravasation of contrast. IMPRESSION: Percutaneous gastrostomy tube with tip likely in the body of the stomach. Electronically Signed   By: Darliss Cheney M.D.   On: 06/23/2023 19:34    Assessment and plan- Patient is a 68 y.o. female with history of head and neck, breast cancer as well as adrenal carcinoma of unknown primary in the past now with non-small cell carcinoma of unknown primary predominantly with mediastinal adenopathy. She is being treated as possible  non-small cell lung cancer.  She is s/p concurrent chemoradiation and here for routine follow-up  Patient only completed 4 weekly cycles of CarboTaxol chemotherapy out of 7 due to poor tolerance and progressive fatigue and failure to thrive.She was hospitalized for this and eventually had to get feeding tube placement that she had progressive difficulty swallowing.  Overall she is feeling better and energy levels are improving.  If she  truly improves her swallowing we could eventually not have to use the feeding tube but anticipate that she is going to need this for a while.  Patient receivedCurrent chemoradiation and underwent CT chest abdomen and pelvis with contrast in January 2025.  CT angio chest did show segmental and subsegmental pulmonary emboli for which she is on Eliquis.  Multiple bilateral lung nodules appeared stable.  CT abdomen pelvis without contrast did not show any evidence of distant disease.  I plan at this time is to monitor off treatment with continued scans every 3 to 4 months.  I will plan to get CT chest abdomen pelvis with contrast sometime in early May 2025 and I will see her following that  Recent pulmonary embolism: Continue Eliquis  History of B12 deficiency: B12 injection today and continue monthly B12 injections     Visit Diagnosis 1. Non-small cell cancer of left lung (HCC)   2. B12 deficiency   3. Current use of long term anticoagulation   4. Severe protein-calorie malnutrition (HCC)      Dr. Owens Shark, MD, MPH [facility] at [facility] [phone] 06/30/2023 8:52 AM

## 2023-06-30 NOTE — Telephone Encounter (Signed)
 Left detailed message on identified VM that Dr. Payton Mccallum authorizes OT 1 x 8 weeks

## 2023-06-30 NOTE — Progress Notes (Deleted)
 Hematology/Oncology Consult note [facility]  [phone] [phone]  Patient Care Team: Sherlyn Hay, DO as PCP - General (Family Medicine) Debbe Odea, MD as PCP - Cardiology (Cardiology) Creig Hines, MD as Consulting Physician (Oncology) Leafy Ro, MD as Consulting Physician (General Surgery) Dasher, Cliffton Asters, MD (Dermatology) Scheeler, Kermit Balo, PA-C as Physician Assistant (Plastic Surgery) Minna Antis, MD as Attending Physician (Emergency Medicine) Alvira Philips, Leitha Bleak (Neurology) Dayna Barker, MD as Referring Physician (Internal Medicine) Coralyn Helling, MD (Inactive) as Consulting Physician (Pulmonary Disease) Gaspar Cola, RPH (Inactive) (Pharmacist) Pa, [facility] (Optometry) Glory Buff, RN as Oncology Nurse Navigator Carmina Miller, MD as Consulting Physician (Radiation Oncology) Redge Gainer, RN as [facility] Care Management   Name of the patient: Denise Watts  [id number]  23-Jun-1955   Date of visit: 06/30/23  Diagnosis-non-small cell carcinoma of unknown primary  Chief complaint/ Reason for visit-posthospital discharge follow-up  Heme/Onc history:  Oncology History Overview Note  1. Carcinoma of breast, T1, N0, M0 tumor diagnosis.  In January of 1997 2. Carcinoma of the vocal cord T1, N0, M0 tumor.  Status postradiation therapy 3. Abnormal CT scan of the chest (December, 2015) 4. Repeat CT scan of chest shows stable nodule (July, 2016) 5. Neutropenia with normal hemoglobin and normal platelet count (July, 2016)   Cancer of vocal cord [facility]) (Resolved)  11/10/2014 Initial Diagnosis   Cancer of vocal cord   Malignant neoplasm of lower-outer quadrant of right breast of female, estrogen receptor negative (HCC)  11/24/2017 Initial Diagnosis   Malignant neoplasm of lower-outer quadrant of right breast of female, estrogen receptor negative (HCC)   01/12/2018 Cancer  Staging   Staging form: Breast, AJCC 8th Edition - Clinical stage from 01/12/2018: Stage IB (cT1a, cN0, cM0, G3, ER+, PR-, HER2-) - Signed by Creig Hines, MD on 01/12/2018   01/30/2018 - 04/26/2018 Chemotherapy   The patient had dexamethasone (DECADRON) 4 MG tablet, 8 mg, Oral, 2 times daily, 1 of 1 cycle, Start date: 01/12/2018, End date: 08/03/2018 palonosetron (ALOXI) injection 0.25 mg, 0.25 mg, Intravenous,  Once, 4 of 4 cycles Administration: 0.25 mg (01/30/2018), 0.25 mg (02/27/2018), 0.25 mg (03/28/2018), 0.25 mg (04/26/2018) pegfilgrastim (NEULASTA ONPRO KIT) injection 6 mg, 6 mg, Subcutaneous, Once, 4 of 4 cycles Administration: 6 mg (01/30/2018), 6 mg (02/27/2018), 6 mg (03/28/2018), 6 mg (04/26/2018) cyclophosphamide (CYTOXAN) 1,000 mg in sodium chloride 0.9 % 250 mL chemo infusion, 600 mg/m2 = 1,000 mg, Intravenous,  Once, 4 of 4 cycles Administration: 1,000 mg (01/30/2018), 1,000 mg (02/27/2018), 1,000 mg (03/28/2018), 1,000 mg (04/26/2018) DOCEtaxel (TAXOTERE) 130 mg in sodium chloride 0.9 % 250 mL chemo infusion, 75 mg/m2 = 130 mg, Intravenous,  Once, 4 of 4 cycles Dose modification: 60 mg/m2 (original dose 75 mg/m2, Cycle 2, Reason: Dose not tolerated) Administration: 130 mg (01/30/2018), 100 mg (02/27/2018), 100 mg (03/28/2018), 100 mg (04/26/2018)  for chemotherapy treatment.    Non-small cell lung cancer (HCC)  03/09/2023 Initial Diagnosis   Non-small cell lung cancer (HCC)   03/20/2023 -  Chemotherapy   Patient is on Treatment Plan : LUNG Carboplatin + Paclitaxel q7d        The patient was originally diagnosed with right breast cancer in 1997, 10 mm grade 3, ER pr positive her 2 negative, node negative status post surgery and radiation treatment only. She never received chemotherapy or hormone therapy. For her vocal cord cancer, she had extensive surgery and radiation therapy in 1998.  Due to past history of smoking, she was also noted to have lung nodules. She is undergoing  active surveillance imaging study of the chest.   Patient noted to have adrenal mass on CT chest which prompted PET CT in July 2018 which showed: IMPRESSION: 1. Right adrenal mass with peripheral hypermetabolism. Given interval development since 11/18/2015, suspicious for either isolated metastasis or a primary adrenal neoplasm. This should be considered for biopsy. 2. No evidence of hypermetabolic primary malignancy or extraadrenal metastasis. Pulmonary nodules are not significantly hypermetabolic. 3. Coronary artery atherosclerosis. Aortic Atherosclerosis (ICD10-I70.0). 4. Hepatic steatosis. 5. Left sixth rib hypermetabolism is favored to be related to remote Trauma.   Patient underwent right adrenalectomy which showed: DIAGNOSIS:  A. ADRENAL GLAND, RIGHT; ADRENALECTOMY:  - HIGH GRADE CARCINOMA WITH EXTENSIVE NECROSIS.  - MARGINS OF THE SPECIMEN ARE NEGATIVE FOR CARCINOMA.   Comment:  Sections demonstrate abundant necrosis confined to the adrenal medullary  compartment. In one block of tissue a small fragment of viable malignant  neoplasm was identified. A panel of immunohistochemical stains was  performed with the following pattern of immunoreactivity:  Super pancytokeratin: Positive  GATA-3: Negative  TTF-1: Negative  P40: Negative  Napsin: Negative  PAX-8: Negative  Chromogranin: Negative     Repeat CT chest abdomen and pelvis in July 2019 showed stable left lower lobe pulmonary nodule.  No other evidence of metastatic disease.  New 4.5 mm nodule in the right breast.  This was followed by ultrasound mammogram and core biopsy of the breast lesion which was a grade 3 triple negative breast cancer.  Patient was seen by Dr. Lemar Livings and underwent lumpectomy on 12/08/2017.   Patient had a 5 mm weakly ER positive tumor.  Given her ongoing fatigue Oncotype testing was done to see if she could potentially avoid chemotherapy.  Oncotype shows a recurrence score of 53 with her risk of  distant recurrence at 9 years at greater than 39% with hormone therapy alone.  Absolute benefit of chemotherapy was greater than 15% in her age group.  Patient completed 4 cycles of adjuvant TC chemotherapy in December 2019    Patient has known bilateral pulmonary nodules which have been monitored and she was again found to have a left lower lobe lung nodule measuring 13 mm unchanged as compared to prior exams.  There were also new subcentimeter lung nodules detected and follow-up CT was recommended.  CT angio chest in August 2024 showed increase in the size of left upper lobe lung nodule and new multiple pulmonary nodules concerning for metastatic disease.  This was followed by a PET CT scan which showed hypermetabolic intrathoracic nodal adenopathy measuring 5 to 13 mm in size along with lung nodules with a maximum size measuring 13 mm with an SUV of 9.2.   CT-guided biopsy was nondiagnostic she is patient underwent bronchoscopy but lymph nodes were not sampled at that time due to equipment malfunction.Bronchial lavage from left upper lobe was positive for malignancy non-small cell carcinoma.  But no further subclassification whether this is primary lung cancer or metastatic carcinoma could be done due to limited specimen. EBUS station 7 LN also shows non small cell carcinoma   Patient underwent concurrent chemoradiation with weekly CarboTaxol which she tolerated poorly.  Patient currently received 4 cycles of chemotherapy due to progressive fatigue and poor oral intake  Interval history-patient presently has a feeding tube in place and is keeping up with her nutrition through that.  Energy levels are better although not back to  her baseline  ECOG PS- 2 Pain scale- 4   Review of systems- Review of Systems  Constitutional:  Positive for malaise/fatigue. Negative for chills, fever and weight loss.  HENT:  Negative for congestion, ear discharge and nosebleeds.   Eyes:  Negative for blurred vision.   Respiratory:  Negative for cough, hemoptysis, sputum production, shortness of breath and wheezing.   Cardiovascular:  Negative for chest pain, palpitations, orthopnea and claudication.  Gastrointestinal:  Negative for abdominal pain, blood in stool, constipation, diarrhea, heartburn, melena, nausea and vomiting.  Genitourinary:  Negative for dysuria, flank pain, frequency, hematuria and urgency.  Musculoskeletal:  Positive for back pain and myalgias. Negative for joint pain.  Skin:  Negative for rash.  Neurological:  Negative for dizziness, tingling, focal weakness, seizures, weakness and headaches.  Endo/Heme/Allergies:  Does not bruise/bleed easily.  Psychiatric/Behavioral:  Negative for depression and suicidal ideas. The patient does not have insomnia.       Allergies  Allergen Reactions   Sulfa Antibiotics Other (See Comments)    Mouth sore and raw     Past Medical History:  Diagnosis Date   Acute pulmonary embolism (HCC) 05/10/2023   Adenomatous colon polyp    Adrenal carcinoma, right (HCC) 12/28/2016   a.) high grade carcinoma with extensive necrosis --> s/p adrenalectomy   Anxiety    Aortic atherosclerosis (HCC)    Arthritis of right knee    Atrial fibrillation and flutter (HCC)    a.) CHA2DS2-VASc = 3 (age, sex, vascular disease history) as of 02/13/2023; b.) cardiac rate/rhythm maintained intrinsically without pharmacological intervention; no chronic OAC (does take low dose ASA)   B12 deficiency    Breast cancer (HCC) 1997   a.) stage IB (cT1a, cN0, cM0, G3, ER+, PR-, HER2-); s/p lumpectomy + XRT   COPD (chronic obstructive pulmonary disease) (HCC)    Coronary artery disease    a.) cCTA 12/09/2021: Ca2+ = 135 (80th %'ile for age/sex/race match contol) --> 25-49% pLAD and RCA distributions   Depression    Diastolic dysfunction    a.) TTE 12/11/2020, no RWMAs, norm RVSF, G1DD; b.) TTE 12/14/2021: EF 55-60, no RWMAs, norm RVSF, G1DD; c.) TTE 01/27/2023: EF 50-55%, no  RWMAs, G1DD, mild RAE, mild MR, mod TR   Dry cough 03/02/2017   Dyspnea    Full dentures    GERD (gastroesophageal reflux disease)    History of hiatal hernia    HLD (hyperlipidemia)    Invasive carcinoma of breast (HCC) 11/15/2017   a.) path (+) for G3, ER -, PR -, HER2/neu - ; s/p lumpectomy 01/17/2018 + 4 cycles TC systemic chemotherapy + XRT   Long term current use of aspirin    Personal history of chemotherapy    Personal history of radiation therapy    Pneumothorax of left lung after biopsy 01/25/2023   a.) hypoxic in PACU requiring re-intubation --> transferred to ICU --> chest tube placed by PCCM   RLS (restless legs syndrome)    a.) on ropinirole   Skin cancer    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   a.) Stage IB (cT1a, cN0, cM0, G3, ER+, PR-, HER2-)     Past Surgical History:  Procedure Laterality Date   BIOPSY  01/05/2023   Procedure: BIOPSY;  Surgeon: Toney Reil, MD;  Location: [facility];  Service: Gastroenterology;;   BREAST BIOPSY Right 1997   positive   BREAST BIOPSY Right 11/15/2017   INVASIVE MAMMARY CARCINOMA triple negative   BREAST BIOPSY  Right 07/20/2020   [place] bx, Q marker, neg   BREAST LUMPECTOMY Right 1997   2019 also   BREAST LUMPECTOMY WITH SENTINEL LYMPH NODE BIOPSY Right 12/08/2017   Procedure: BREAST LUMPECTOMY WITH SENTINEL LYMPH NODE BX;  Surgeon: Earline Mayotte, MD;  Location: [facility];  Service: General;  Laterality: Right;   BREAST REDUCTION WITH MASTOPEXY Left 06/10/2019   Procedure: Left breast mastopexy reduction for symmetry;  Surgeon: Peggye Form, DO;  Location: [facility];  Service: Plastics;  Laterality: Left;  total 2.5 hours   CATARACT EXTRACTION W/PHACO Right 02/07/2022   Procedure: CATARACT EXTRACTION PHACO AND INTRAOCULAR LENS PLACEMENT (IOC) RIGHT DIABETIC;  Surgeon: Nevada Crane, MD;  Location: [facility];  Service: Ophthalmology;  Laterality: Right;  3.60 00:34.7    CATARACT EXTRACTION W/PHACO Left 02/28/2022   Procedure: CATARACT EXTRACTION PHACO AND INTRAOCULAR LENS PLACEMENT (IOC) LEFT DIABETIC 4.11 00:26.1;  Surgeon: Nevada Crane, MD;  Location: [facility];  Service: Ophthalmology;  Laterality: Left;   COLONOSCOPY  2015   COLONOSCOPY N/A 01/05/2023   Procedure: COLONOSCOPY;  Surgeon: Toney Reil, MD;  Location: [facility];  Service: Gastroenterology;  Laterality: N/A;   COLONOSCOPY WITH PROPOFOL N/A 10/29/2019   Procedure: COLONOSCOPY WITH PROPOFOL;  Surgeon: Midge Minium, MD;  Location: [facility];  Service: Endoscopy;  Laterality: N/A;   ESOPHAGOGASTRODUODENOSCOPY (EGD) WITH PROPOFOL N/A 05/24/2019   Procedure: ESOPHAGOGASTRODUODENOSCOPY (EGD) WITH PROPOFOL;  Surgeon: Midge Minium, MD;  Location: [facility];  Service: Endoscopy;  Laterality: N/A;   ESOPHAGOGASTRODUODENOSCOPY (EGD) WITH PROPOFOL N/A 09/06/2022   Procedure: ESOPHAGOGASTRODUODENOSCOPY (EGD) WITH PROPOFOL;  Surgeon: Midge Minium, MD;  Location: [facility];  Service: Endoscopy;  Laterality: N/A;   ESOPHAGOGASTRODUODENOSCOPY (EGD) WITH PROPOFOL  01/05/2023   Procedure: ESOPHAGOGASTRODUODENOSCOPY (EGD) WITH PROPOFOL;  Surgeon: Toney Reil, MD;  Location: [facility];  Service: Gastroenterology;;   ESOPHAGUS SURGERY     GASTROSTOMY N/A 05/25/2023   Procedure: OPEN INSERTION OF GASTROSTOMY TUBE;  Surgeon: Leafy Ro, MD;  Location: [facility];  Service: General;  Laterality: N/A;   IR IMAGING GUIDED PORT INSERTION  02/07/2023   KNEE SURGERY Right    LIPOSUCTION WITH LIPOFILLING Right 06/10/2019   Procedure: right breast scar and capsule release with fat grafting;  Surgeon: Peggye Form, DO;  Location: [facility];  Service: Plastics;  Laterality: Right;   POLYPECTOMY  01/05/2023   Procedure: POLYPECTOMY;  Surgeon: Toney Reil, MD;  Location: [facility] ENDOSCOPY;  Service: Gastroenterology;;   [facility] REMOVAL      PORTACATH PLACEMENT Right 01/17/2018   Procedure: INSERTION PORT-A-CATH- RIGHT;  Surgeon: Earline Mayotte, MD;  Location: [facility];  Service: General;  Laterality: Right;   PULMONARY THROMBECTOMY Bilateral 05/12/2023   Procedure: PULMONARY THROMBECTOMY;  Surgeon: Annice Needy, MD;  Location: [facility];  Service: Cardiovascular;  Laterality: Bilateral;   ROBOTIC ADRENALECTOMY Right 12/28/2016   Procedure: ROBOTIC ADRENALECTOMY;  Surgeon: Vanna Scotland, MD;  Location: [facility];  Service: Urology;  Laterality: Right;   SKIN CANCER EXCISION     THROAT SURGERY  1998   throat cancer    TUBAL LIGATION     VENTRAL HERNIA REPAIR N/A 03/03/2017   Procedure: HERNIA REPAIR VENTRAL ADULT;  Surgeon: Ricarda Frame, MD;  Location: [facility];  Service: General;  Laterality: N/A;   VIDEO BRONCHOSCOPY WITH ENDOBRONCHIAL ULTRASOUND N/A 01/25/2023   Procedure: VIDEO BRONCHOSCOPY WITH ENDOBRONCHIAL ULTRASOUND;  Surgeon: Vida Rigger, MD;  Location: [facility];  Service: Thoracic;  Laterality: N/A;   VIDEO BRONCHOSCOPY WITH ENDOBRONCHIAL ULTRASOUND N/A 02/17/2023   Procedure: VIDEO BRONCHOSCOPY WITH ENDOBRONCHIAL ULTRASOUND;  Surgeon: Vida Rigger, MD;  Location: [facility];  Service: Thoracic;  Laterality: N/A;    Social History   Socioeconomic History   Marital status: Married    Spouse name: Shariya, Gaster (Spouse) [phone] ([place])   Number of children: 4   Years of education: Not on file   Highest education level: 10th grade  Occupational History   Occupation: retired  Tobacco Use   Smoking status: Former    Current packs/day: 0.00    Average packs/day: 1.5 packs/day for 30.0 years (45.0 ttl pk-yrs)    Types: Cigarettes    Start date: 05/02/1965    Quit date: 05/03/1995    Years since quitting: 28.1    Passive exposure: Past   Smokeless tobacco: Never  Vaping Use   Vaping status: Never Used  Substance and Sexual Activity   Alcohol use: Not Currently    Alcohol/week: 4.0  standard drinks of alcohol    Types: 4 Shots of liquor per week    Comment: none   Drug use: No   Sexual activity: Not Currently    Birth control/protection: Post-menopausal  Other Topics Concern   Not on file  Social History Narrative   Patient from home with husband.   Social Drivers of Corporate investment banker Strain: Low Risk  (05/19/2023)   Overall Financial Resource Strain (CARDIA)    Difficulty of Paying Living Expenses: Not very hard  Food Insecurity: No Food Insecurity (05/24/2023)   Hunger Vital Sign    Worried About Running Out of Food in the Last Year: Never true    Ran Out of Food in the Last Year: Never true  Transportation Needs: No Transportation Needs (05/24/2023)   PRAPARE - Administrator, Civil Service (Medical): No    Lack of Transportation (Non-Medical): No  Physical Activity: Inactive (05/19/2023)   Exercise Vital Sign    Days of Exercise per Week: 0 days    Minutes of Exercise per Session: 0 min  Stress: Stress Concern Present (05/19/2023)   [employer] of Occupational Health - Occupational Stress Questionnaire    Feeling of Stress : To some extent  Social Connections: Moderately Isolated (05/24/2023)   Social Connection and Isolation Panel [NHANES]    Frequency of Communication with Friends and Family: More than three times a week    Frequency of Social Gatherings with Friends and Family: More than three times a week    Attends Religious Services: Never    Database administrator or Organizations: No    Attends Banker Meetings: Never    Marital Status: Married  Catering manager Violence: Not At Risk (05/24/2023)   Humiliation, Afraid, Rape, and Kick questionnaire    Fear of Current or Ex-Partner: No    Emotionally Abused: No    Physically Abused: No    Sexually Abused: No    Family History  Problem Relation Age of Onset   Diabetes Mother    Heart attack Mother    Cancer Sister        cancer of eyes, spread to lung,  other places. died at 41   Cancer Sister 26       unknown primary, spread to bone, brain died in 45's   Cancer Paternal Aunt        unk type   Cancer Paternal Grandmother  type unk   Prostate cancer Neg Hx    Kidney cancer Neg Hx    Bladder Cancer Neg Hx    Breast cancer Neg Hx      Current Outpatient Medications:    apixaban (ELIQUIS) 5 MG TABS tablet, Take 1 tablet (5 mg total) by mouth 2 (two) times daily., Disp: 60 tablet, Rfl: 3   benzonatate (TESSALON) 100 MG capsule, Take 2 capsules (200 mg total) by mouth 3 (three) times daily as needed for cough., Disp: , Rfl:    bisacodyl (DULCOLAX) 10 MG suppository, Place 1 suppository (10 mg total) rectally daily as needed for moderate constipation or severe constipation., Disp: , Rfl:    busPIRone (BUSPAR) 5 MG tablet, Take 1 tablet (5 mg total) by mouth 3 (three) times daily as needed., Disp: 90 tablet, Rfl: 0   dextromethorphan-guaiFENesin (MUCINEX DM) 30-600 MG 12hr tablet, Take 1 tablet by mouth 2 (two) times daily as needed for cough., Disp: , Rfl:    fentaNYL (DURAGESIC) 12 MCG/HR, Place 1 patch onto the skin every 3 (three) days., Disp: 10 patch, Rfl: 0   lactulose (CHRONULAC) 10 GM/15ML solution, Place 30 mLs (20 g total) into feeding tube 2 (two) times daily., Disp: 946 mL, Rfl: 0   lidocaine (LIDODERM) 5 %, Place 1 patch onto the skin daily. Remove & Discard patch within 12 hours or as directed by MD, Disp: , Rfl:    meloxicam (MOBIC) 7.5 MG tablet, Take 1 tablet (7.5 mg total) by mouth daily., Disp: 30 tablet, Rfl: 0   metoprolol succinate (TOPROL-XL) 25 MG 24 hr tablet, Take 1 tablet (25 mg total) by mouth daily., Disp: , Rfl:    Nutritional Supplements (NUTREN 1.5) LIQD, Give 1 carton 4 times a day (8am, noon, 4pm and 8pm). Flush with 60ml of water before and after each feeding.  Give additional 1 1/2 cups of water a day via tube or drink orally for adequate hydration., Disp: , Rfl:    omeprazole (KONVOMEP) 2 mg/mL SUSP oral  suspension, Take 20 mLs (40 mg total) by mouth daily., Disp: 240 mL, Rfl: 1   ondansetron (ZOFRAN) 4 MG tablet, Take 1 tablet (4 mg total) by mouth every 6 (six) hours as needed for nausea or vomiting., Disp: , Rfl:    oxyCODONE 7.5 MG TABS, Place 7.5 mg into feeding tube every 4 (four) hours as needed for severe pain (pain score 7-10) or moderate pain (pain score 4-6)., Disp: 120 tablet, Rfl: 0   rOPINIRole (REQUIP) 0.25 MG tablet, Place 1 tablet (0.25 mg total) into feeding tube at bedtime., Disp: , Rfl:    rosuvastatin (CRESTOR) 40 MG tablet, Place 1 tablet (40 mg total) into feeding tube daily., Disp: , Rfl:    senna (SENOKOT) 8.6 MG TABS tablet, Place 1 tablet (8.6 mg total) into feeding tube daily., Disp: , Rfl:    simethicone (MYLICON) 80 MG chewable tablet, Place 1 tablet (80 mg total) into feeding tube 4 (four) times daily., Disp: , Rfl:    sucralfate (CARAFATE) 1 GM/10ML suspension, Take 10 mLs (1 g total) by mouth 4 (four) times daily., Disp: 420 mL, Rfl: 1   triamcinolone cream (KENALOG) 0.1 %, Apply 1 Application topically 2 (two) times daily., Disp: , Rfl:    Water For Irrigation, Sterile (FREE WATER) SOLN, Place 60 mLs into feeding tube 5 (five) times daily., Disp: , Rfl:  No current facility-administered medications for this visit.  Facility-Administered Medications Ordered in Other Visits:  cyanocobalamin ((VITAMIN B-12)) injection 1,000 mcg, 1,000 mcg, Intramuscular, Q30 days, Creig Hines, MD, 1,000 mcg at 08/27/21 1537   cyanocobalamin (VITAMIN B12) injection 1,000 mcg, 1,000 mcg, Intramuscular, Q30 days, Creig Hines, MD, 1,000 mcg at 03/16/22 1101  Physical exam: There were no vitals filed for this visit. Physical Exam Constitutional:      Comments: She is frail appearing and sitting in a wheelchair.  Appears in no acute distress  Cardiovascular:     Rate and Rhythm: Normal rate and regular rhythm.     Heart sounds: Normal heart sounds.  Pulmonary:     Effort:  Pulmonary effort is normal.     Breath sounds: Normal breath sounds.  Abdominal:     General: Bowel sounds are normal.     Palpations: Abdomen is soft.     Comments: Feeding tube in place  Skin:    General: Skin is warm and dry.  Neurological:     Mental Status: She is alert and oriented to person, place, and time.         Latest Ref Rng & Units 06/28/2023   12:54 PM  CMP  Glucose 70 - 99 mg/dL 96   BUN 8 - 23 mg/dL 24   Creatinine 8.29 - 1.00 mg/dL 5.62   Sodium 130 - 865 mmol/L 136   Potassium 3.5 - 5.1 mmol/L 4.9   Chloride 98 - 111 mmol/L 101   CO2 22 - 32 mmol/L 26   Calcium 8.9 - 10.3 mg/dL 9.0   Total Protein 6.5 - 8.1 g/dL 6.8   Total Bilirubin 0.0 - 1.2 mg/dL 1.0   Alkaline Phos 38 - 126 U/L 76   AST 15 - 41 U/L 21   ALT 0 - 44 U/L 11       Latest Ref Rng & Units 06/28/2023   12:54 PM  CBC  WBC 4.0 - 10.5 K/uL 6.5   Hemoglobin 12.0 - 15.0 g/dL 78.4   Hematocrit 69.6 - 46.0 % 40.1   Platelets 150 - 400 K/uL 208     No images are attached to the encounter.  DG ABDOMEN PEG TUBE LOCATION Result Date: 06/23/2023 CLINICAL DATA:  Peg adjustment EXAM: ABDOMEN - 1 VIEW COMPARISON:  Abdominal x-ray 05/22/2023 FINDINGS: Percutaneous gastrostomy tube is present with tip likely in the body of the stomach. Contrast is seen within nondilated stomach and proximal duodenal. There are no dilated bowel loops. There is no extravasation of contrast. IMPRESSION: Percutaneous gastrostomy tube with tip likely in the body of the stomach. Electronically Signed   By: Darliss Cheney M.D.   On: 06/23/2023 19:34     Assessment and plan- Patient is a 68 y.o. female with history of head and neck, breast cancer as well as adrenal carcinoma of unknown primary in the past now with non-small cell carcinoma of unknown primary predominantly with mediastinal adenopathy. She is being treated as possible non-small cell lung cancer.  She is s/p concurrent chemoradiation and here for routine  follow-up  Patient only completed 4 weekly cycles of CarboTaxol chemotherapy out of 7 due to poor tolerance and progressive fatigue and failure to thrive.She was hospitalized for this and eventually had to get feeding tube placement that she had progressive difficulty swallowing.  Overall she is feeling better and energy levels are improving.  If she truly improves her swallowing we could eventually not have to use the feeding tube but anticipate that she is going to need this for a while.  Patient receivedCurrent chemoradiation and underwent CT chest abdomen and pelvis with contrast in January 2025.  CT angio chest did show segmental and subsegmental pulmonary emboli for which she is on Eliquis.  Multiple bilateral lung nodules appeared stable.  CT abdomen pelvis without contrast did not show any evidence of distant disease.  I plan at this time is to monitor off treatment with continued scans every 3 to 4 months.  I will plan to get CT chest abdomen pelvis with contrast sometime in early May 2025 and I will see her following that  Recent pulmonary embolism: Continue Eliquis  History of B12 deficiency: B12 injection today and continue monthly B12 injections   Visit Diagnosis 1. Current use of long term anticoagulation   2. Non-small cell cancer of left lung (HCC)      Dr. Owens Shark, MD, MPH [facility] at [facility] [phone] 06/30/2023 8:44 AM

## 2023-07-03 ENCOUNTER — Ambulatory Visit
Admission: RE | Admit: 2023-07-03 | Discharge: 2023-07-03 | Disposition: A | Discharge status: Home / Self Care | Payer: Medicare HMO | Source: Ambulatory Visit | Attending: Radiation Oncology | Admitting: Radiation Oncology

## 2023-07-03 ENCOUNTER — Ambulatory Visit: Payer: Self-pay | Admitting: *Deleted

## 2023-07-03 ENCOUNTER — Encounter: Payer: Self-pay | Admitting: Radiation Oncology

## 2023-07-03 VITALS — BP 119/80 | HR 90 | Temp 98.2°F | Resp 18 | Ht 61.0 in | Wt 110.2 lb

## 2023-07-03 DIAGNOSIS — F419 Anxiety disorder, unspecified: Secondary | ICD-10-CM

## 2023-07-03 DIAGNOSIS — E440 Moderate protein-calorie malnutrition: Secondary | ICD-10-CM

## 2023-07-03 DIAGNOSIS — I480 Paroxysmal atrial fibrillation: Secondary | ICD-10-CM

## 2023-07-03 DIAGNOSIS — I5032 Chronic diastolic (congestive) heart failure: Secondary | ICD-10-CM | POA: Diagnosis not present

## 2023-07-03 DIAGNOSIS — R059 Cough, unspecified: Secondary | ICD-10-CM | POA: Diagnosis not present

## 2023-07-03 DIAGNOSIS — Z8521 Personal history of malignant neoplasm of larynx: Secondary | ICD-10-CM | POA: Diagnosis not present

## 2023-07-03 DIAGNOSIS — C3482 Malignant neoplasm of overlapping sites of left bronchus and lung: Secondary | ICD-10-CM | POA: Diagnosis present

## 2023-07-03 DIAGNOSIS — M1711 Unilateral primary osteoarthritis, right knee: Secondary | ICD-10-CM

## 2023-07-03 DIAGNOSIS — I4892 Unspecified atrial flutter: Secondary | ICD-10-CM

## 2023-07-03 DIAGNOSIS — Z853 Personal history of malignant neoplasm of breast: Secondary | ICD-10-CM | POA: Diagnosis not present

## 2023-07-03 DIAGNOSIS — I2510 Atherosclerotic heart disease of native coronary artery without angina pectoris: Secondary | ICD-10-CM

## 2023-07-03 DIAGNOSIS — Z431 Encounter for attention to gastrostomy: Secondary | ICD-10-CM | POA: Diagnosis not present

## 2023-07-03 DIAGNOSIS — Z923 Personal history of irradiation: Secondary | ICD-10-CM | POA: Diagnosis not present

## 2023-07-03 DIAGNOSIS — F32A Depression, unspecified: Secondary | ICD-10-CM

## 2023-07-03 DIAGNOSIS — I700 Atherosclerosis of aorta: Secondary | ICD-10-CM

## 2023-07-03 DIAGNOSIS — J449 Chronic obstructive pulmonary disease, unspecified: Secondary | ICD-10-CM | POA: Diagnosis not present

## 2023-07-03 DIAGNOSIS — R1310 Dysphagia, unspecified: Secondary | ICD-10-CM | POA: Diagnosis not present

## 2023-07-03 NOTE — Patient Outreach (Addendum)
 Care Coordination   Initial Visit Note   07/03/2023 Name: Denise Watts MRN: [id number] DOB: 1955/11/16  Denise Watts is a 68 y.o. year old female who sees Pardue, Monico Blitz, DO for primary care. I spoke with  Denise Watts by phone today.  What matters to the patients health and wellness today?  Patient confirms having no community resource/mental health needs at this time. Patient declines ongoing mental health follow up. Patient encouraged to contact this social worker with any additional community resource needs.    Goals Addressed             This Visit's Progress    care coordintation activties         EMOTIONAL / MENTAL HEALTH SUPPORT Start / continue relaxed breathing 3 times daily Keep all upcoming appointment discussed today Continue with compliance of taking medication prescribed by Doctor Self Support options  (continue to reach out to family and friends for support, reach out to this CSW if you decide to re-consider ongoing mental health counseling)         SDOH assessments and interventions completed:  Yes  SDOH Interventions Today    Flowsheet Row Most Recent Value  SDOH Interventions   Food Insecurity Interventions Intervention Not Indicated  Housing Interventions Intervention Not Indicated  Transportation Interventions Intervention Not Indicated  Utilities Interventions Intervention Not Indicated  Depression Interventions/Treatment  Medication        Care Coordination Interventions:  Yes, provided  Interventions Today    Flowsheet Row Most Recent Value  Chronic Disease   Chronic disease during today's visit Other  [Lung Cancer]  General Interventions   General Interventions Discussed/Reviewed General Interventions Discussed, Level of Care, Doctor Visits  [Patient assessed for community resource/mental health needs related to her current medical condition. Patient active the HH(PT, OT nursing)  Denies having no community resource needs at this time.]   Doctor Visits Discussed/Reviewed Doctor Visits Discussed  [3/3 Rad Onc, PCP 3/4]  Level of Care Personal Care Services  [Patient confirms that her granddaughter assists her with her daily needs -spouse transports her to medical appointments]  Exercise Interventions   Exercise Discussed/Reviewed Exercise Discussed  [Patient active with [facility]]  Mental Health Interventions   Mental Health Discussed/Reviewed Mental Health Discussed, Coping Strategies, Depression  [Patient assessed for ongoing MH needs. Patient confirms depressed mood as her baseline-confirms supportive family, copes with taking things "day by day" declines need for ongoing mental health counseling at this time.]       Follow up plan: No further intervention required.   Encounter Outcome:  Patient Visit Completed

## 2023-07-03 NOTE — Progress Notes (Signed)
 Radiation Oncology Follow up Note  Name: Denise Watts   Date:   07/03/2023 MRN:  [id number] DOB: [birth year]    This 68 y.o. 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    REFERRING PROVIDER: Sherlyn Hay, DO  HPI: Patient is a 68 year old female now out 1 month having completed concurrent chemoradiation therapy for stage IIIb non-small cell lung cancer of the left lung.  She is multiple at other areas of treatment including treatment for breast cancer 97 adrenal carcinoma and squamous cell carcinoma the vocal cords in 2015.Marland Kitchen  She is currently off treatment is being followed by Dr. Smith Robert.  She does have a feeding tube.  She underwent weekly CarboTaxol along with radiation which she tolerated poorly.  She continues to have a cough no hemoptysis or chest tightness.  She feels she is improving although she is quite weak still.  COMPLICATIONS OF TREATMENT: none  FOLLOW UP COMPLIANCE: keeps appointments   PHYSICAL EXAM:  BP 119/80   Pulse 90   Temp 98.2 F (36.8 C) (Tympanic)   Resp 18   Ht 5\' 1"  (1.549 m)   Wt 110 lb 3.2 oz (50 kg)   BMI 20.82 kg/m  Frail-appearing female in NAD.  Well-developed well-nourished patient in NAD. HEENT reveals PERLA, EOMI, discs not visualized.  Oral cavity is clear. No oral mucosal lesions are identified. Neck is clear without evidence of cervical or supraclavicular adenopathy. Lungs are clear to A&P. Cardiac examination is essentially unremarkable with regular rate and rhythm without murmur rub or thrill. Abdomen is benign with no organomegaly or masses noted. Motor sensory and DTR levels are equal and symmetric in the upper and lower extremities. Cranial  nerves II through XII are grossly intact. Proprioception is intact. No peripheral adenopathy or edema is identified. No motor or sensory levels are noted. Crude visual fields are within normal range.  RADIOLOGY RESULTS: No current films to review  PLAN: Present time patient is slated for CT chest abdomen pelvis in April.  She otherwise continues close observation by medical oncology.  I have asked to see her back in 4 months for follow-up.  Patient is to call with any concerns at any time.  I would like to take this opportunity to thank you for allowing me to participate in the care of your patient.Carmina Miller, MD

## 2023-07-03 NOTE — Patient Instructions (Signed)
 Visit Information  Thank you for taking time to visit with me today. Please don't hesitate to contact me if I can be of assistance to you.   Following are the goals we discussed today:   Goals Addressed             This Visit's Progress    care coordintation activties         EMOTIONAL / MENTAL HEALTH SUPPORT Start / continue relaxed breathing 3 times daily Keep all upcoming appointment discussed today Continue with compliance of taking medication prescribed by Doctor Self Support options  (continue to reach out to family and friends for support, reach out to this CSW if you decide to re-consider ongoing mental health counseling)         If you are experiencing a Mental Health or Behavioral Health Crisis or need someone to talk to, please call 911   Patient verbalizes understanding of instructions and care plan provided today and agrees to view in MyChart. Active MyChart status and patient understanding of how to access instructions and care plan via MyChart confirmed with patient.     No further follow up required: patient to contact this Child psychotherapist with any additional community resource/mental health  needs.  Dennisha Mouser, LCSW [place]  [facility], [facility] Licensed Clinical Social Worker Care Coordinator  Direct Dial: [phone]

## 2023-07-04 ENCOUNTER — Ambulatory Visit: Payer: Medicare HMO | Admitting: Family Medicine

## 2023-07-04 ENCOUNTER — Other Ambulatory Visit: Payer: Self-pay

## 2023-07-04 ENCOUNTER — Encounter: Payer: Self-pay | Admitting: Family Medicine

## 2023-07-04 ENCOUNTER — Telehealth: Payer: Self-pay

## 2023-07-04 VITALS — BP 107/82 | HR 89 | Temp 98.4°F | Ht 61.0 in | Wt 110.0 lb

## 2023-07-04 DIAGNOSIS — F322 Major depressive disorder, single episode, severe without psychotic features: Secondary | ICD-10-CM

## 2023-07-04 DIAGNOSIS — Z431 Encounter for attention to gastrostomy: Secondary | ICD-10-CM | POA: Diagnosis not present

## 2023-07-04 DIAGNOSIS — E440 Moderate protein-calorie malnutrition: Secondary | ICD-10-CM | POA: Diagnosis not present

## 2023-07-04 DIAGNOSIS — I700 Atherosclerosis of aorta: Secondary | ICD-10-CM | POA: Diagnosis not present

## 2023-07-04 DIAGNOSIS — R1310 Dysphagia, unspecified: Secondary | ICD-10-CM

## 2023-07-04 DIAGNOSIS — G2581 Restless legs syndrome: Secondary | ICD-10-CM

## 2023-07-04 DIAGNOSIS — I2510 Atherosclerotic heart disease of native coronary artery without angina pectoris: Secondary | ICD-10-CM | POA: Diagnosis not present

## 2023-07-04 DIAGNOSIS — J411 Mucopurulent chronic bronchitis: Secondary | ICD-10-CM

## 2023-07-04 DIAGNOSIS — I25118 Atherosclerotic heart disease of native coronary artery with other forms of angina pectoris: Secondary | ICD-10-CM

## 2023-07-04 DIAGNOSIS — J449 Chronic obstructive pulmonary disease, unspecified: Secondary | ICD-10-CM | POA: Diagnosis not present

## 2023-07-04 DIAGNOSIS — J441 Chronic obstructive pulmonary disease with (acute) exacerbation: Secondary | ICD-10-CM

## 2023-07-04 DIAGNOSIS — I480 Paroxysmal atrial fibrillation: Secondary | ICD-10-CM | POA: Diagnosis not present

## 2023-07-04 DIAGNOSIS — R251 Tremor, unspecified: Secondary | ICD-10-CM

## 2023-07-04 DIAGNOSIS — I5032 Chronic diastolic (congestive) heart failure: Secondary | ICD-10-CM | POA: Diagnosis not present

## 2023-07-04 DIAGNOSIS — I4892 Unspecified atrial flutter: Secondary | ICD-10-CM | POA: Diagnosis not present

## 2023-07-04 MED ORDER — METHYLPREDNISOLONE 4 MG PO TBPK: ORAL_TABLET | ORAL | 0 refills | Status: DC

## 2023-07-04 MED ORDER — DM-GUAIFENESIN ER 30-600 MG PO TB12
1.0000 | ORAL_TABLET | Freq: Two times a day (BID) | ORAL | 0 refills | Status: DC | PRN
Start: 2023-07-04 — End: 2023-09-04

## 2023-07-04 MED ORDER — ROPINIROLE HCL 0.5 MG PO TABS: 0.5000 mg | ORAL_TABLET | Freq: Three times a day (TID) | ORAL | 1 refills | Status: DC

## 2023-07-04 MED ORDER — CEFDINIR 300 MG PO CAPS: 300.0000 mg | ORAL_CAPSULE | Freq: Two times a day (BID) | ORAL | 0 refills | Status: DC

## 2023-07-04 NOTE — Assessment & Plan Note (Signed)
 Symptoms inadequately controlled by current medications, particularly at night. Discussed increasing ropinirole dosage and reviewing medication list for other potential treatments. - Increase ropinirole dosage - Review medication list for other treatments for restless leg syndrome

## 2023-07-04 NOTE — Assessment & Plan Note (Signed)
 Noted.  No acute concerns.  Continue to monitor. - continue rosuvastatin and metoprolol.

## 2023-07-04 NOTE — Assessment & Plan Note (Addendum)
 Well-controlled. Continue buspar 5 mg three times daily as needed

## 2023-07-04 NOTE — Patient Outreach (Signed)
  Care Management   Outreach Note  07/04/2023 Name: DESMA WILKOWSKI MRN: [id number] DOB: [birth year]  An unsuccessful outreach attempt was made today for a scheduled Care Management visit.    Follow Up Plan:  Unable to leave a voice message due to patient's voice mailbox being full. A member of the care team will make additional outreach attempts.    Juanell Fairly [facility] Population Health RN Care Manager Direct Dial: [phone]  Fax: [phone] Website: [email or web address]

## 2023-07-04 NOTE — Progress Notes (Signed)
 Established patient visit   Patient: Denise Watts   DOB: 11-28-1955   68 y.o. Female  MRN: [id number] Visit Date: 07/04/2023  Today's healthcare provider: Sherlyn Hay, DO   Chief Complaint  Patient presents with   Cough    Patient states she has had productive cough for some time.  She was given ZPak last week.  Patient is seen by Oncology for small cell carcinoma.  She reports that they stopped her treatments due to doing well.     Chronic Health    Patient states she can not confirm her medications because she does not know what any of them are.  She is going to take our list home and compare what she has at home that she is taking with what is on the list.    She also complains she needs to have medication refilled for restless leg syndrome.   Subjective    HPI Denise Watts is a 68 year old female with lung cancer and reported Parkinson's disease who presents with a persistent cough. She is accompanied by her granddaughter, who helps with her care.  She has been experiencing a persistent cough since January, which has recently worsened and disrupts her sleep. She completed a course of azithromycin with some relief, but the cough persists. Mucinex has been somewhat helpful, and she plans to continue its use. A steroid taken with the azithromycin increased her appetite and energy. She is scheduled for a CT scan in April, and her treatments are paused until then.   She fell a couple of weeks ago, resulting in rib pain, but imaging showed no fractures, suggesting bruising. She follows up with oncology and has been released from radiation follow-up for four months.  She has significant tremors affecting her ability to carry items without spilling. She takes Requip, which is not sufficiently controlling her symptoms. She inquires about a medication for restless leg syndrome, which she experienced last night, but cannot recall the name of the medication she previously used. She is  also on fentanyl patches, which may help with her symptoms.  She has COPD but reports not needing to use her inhalers recently, although she has oxygen available if needed. She confirms having a daily inhaler but has not been using it regularly.  She is assisted by her granddaughter, who visits in the morning and evening to help with her care.     Medications: Outpatient Medications Prior to Visit  Medication Sig   apixaban (ELIQUIS) 5 MG TABS tablet Take 1 tablet (5 mg total) by mouth 2 (two) times daily.   benzonatate (TESSALON) 100 MG capsule Take 2 capsules (200 mg total) by mouth 3 (three) times daily as needed for cough.   bisacodyl (DULCOLAX) 10 MG suppository Place 1 suppository (10 mg total) rectally daily as needed for moderate constipation or severe constipation.   busPIRone (BUSPAR) 5 MG tablet Take 1 tablet (5 mg total) by mouth 3 (three) times daily as needed.   fentaNYL (DURAGESIC) 12 MCG/HR Place 1 patch onto the skin every 3 (three) days.   lactulose (CHRONULAC) 10 GM/15ML solution Place 30 mLs (20 g total) into feeding tube 2 (two) times daily.   lidocaine (LIDODERM) 5 % Place 1 patch onto the skin daily. Remove & Discard patch within 12 hours or as directed by MD   meloxicam (MOBIC) 7.5 MG tablet Take 1 tablet (7.5 mg total) by mouth daily.   metoprolol succinate (TOPROL-XL) 25 MG  24 hr tablet Take 1 tablet (25 mg total) by mouth daily.   Nutritional Supplements (NUTREN 1.5) LIQD Give 1 carton 4 times a day (8am, noon, 4pm and 8pm). Flush with 60ml of water before and after each feeding.  Give additional 1 1/2 cups of water a day via tube or drink orally for adequate hydration.   omeprazole (KONVOMEP) 2 mg/mL SUSP oral suspension Take 20 mLs (40 mg total) by mouth daily.   ondansetron (ZOFRAN) 4 MG tablet Take 1 tablet (4 mg total) by mouth every 6 (six) hours as needed for nausea or vomiting.   oxyCODONE 7.5 MG TABS Place 7.5 mg into feeding tube every 4 (four) hours as  needed for severe pain (pain score 7-10) or moderate pain (pain score 4-6).   rosuvastatin (CRESTOR) 40 MG tablet Place 1 tablet (40 mg total) into feeding tube daily.   senna (SENOKOT) 8.6 MG TABS tablet Place 1 tablet (8.6 mg total) into feeding tube daily.   simethicone (MYLICON) 80 MG chewable tablet Place 1 tablet (80 mg total) into feeding tube 4 (four) times daily.   sucralfate (CARAFATE) 1 GM/10ML suspension Take 10 mLs (1 g total) by mouth 4 (four) times daily.   triamcinolone cream (KENALOG) 0.1 % Apply 1 Application topically 2 (two) times daily.   Water For Irrigation, Sterile (FREE WATER) SOLN Place 60 mLs into feeding tube 5 (five) times daily.   [DISCONTINUED] dextromethorphan-guaiFENesin (MUCINEX DM) 30-600 MG 12hr tablet Take 1 tablet by mouth 2 (two) times daily as needed for cough.   [DISCONTINUED] rOPINIRole (REQUIP) 0.25 MG tablet Place 1 tablet (0.25 mg total) into feeding tube at bedtime.   Facility-Administered Medications Prior to Visit  Medication Dose Route Frequency Provider   cyanocobalamin ((VITAMIN B-12)) injection 1,000 mcg  1,000 mcg Intramuscular Q30 days Creig Hines, MD   cyanocobalamin (VITAMIN B12) injection 1,000 mcg  1,000 mcg Intramuscular Q30 days Creig Hines, MD        Objective    BP 107/82 (BP Location: Left Arm, Patient Position: Sitting, Cuff Size: Normal)   Pulse 89   Temp 98.4 F (36.9 C) (Oral)   Ht 5\' 1"  (1.549 m)   Wt 110 lb (49.9 kg)   SpO2 93%   BMI 20.78 kg/m     Physical Exam Constitutional:      Appearance: Normal appearance.  HENT:     Head: Normocephalic and atraumatic.  Eyes:     General: No scleral icterus.    Extraocular Movements: Extraocular movements intact.     Conjunctiva/sclera: Conjunctivae normal.  Cardiovascular:     Rate and Rhythm: Normal rate and regular rhythm.     Pulses: Normal pulses.     Heart sounds: Normal heart sounds.  Pulmonary:     Effort: Pulmonary effort is normal. No respiratory  distress.     Breath sounds: Rhonchi present.  Musculoskeletal:     Right lower leg: No edema.     Left lower leg: No edema.  Skin:    General: Skin is warm and dry.  Neurological:     Mental Status: She is alert and oriented to person, place, and time. Mental status is at baseline.  Psychiatric:        Mood and Affect: Mood normal.        Behavior: Behavior normal.      No results found for any visits on 07/04/23.  Assessment & Plan    Mucopurulent chronic bronchitis (HCC) Assessment & Plan: Persistent  cough since January, partially responsive to azithromycin and guaifenesin. Differential includes bronchitis and pneumonia. Given her lung cancer history, cough may be exacerbated by underlying malignancy. Discussed risks and benefits of continuing guaifenesin and starting methylprednisolone and cefdinir to address inflammation and probable bacterial infection, aiming to resolve infection and reduce cough frequency and severity. - Prescribe methylprednisolone pack - Prescribe cefdinir - Continue guaifenesin  Non-compliance with inhalers. Has Duonebs and nebulized albuterol prescribed by Pulmonology. Oxygen therapy available if needed. Emphasized importance of regular inhaler use to prevent exacerbations and improve lung function. - Encourage regular use of daily inhaler - Continue oxygen therapy as needed  Orders: -     Cefdinir; Take 1 capsule (300 mg total) by mouth 2 (two) times daily.  Dispense: 20 capsule; Refill: 0 -     methylPREDNISolone; Take 6 pills on day 1, 5 pills on day 2, 4 pills on day 3, 3 pills on day 4, 2 pills on day 5, 1 pill on day 6  Dispense: 1 each; Refill: 0 -     DM-guaiFENesin ER; Take 1 tablet by mouth 2 (two) times daily as needed for cough.  Dispense: 30 tablet; Refill: 0  Restless leg syndrome Assessment & Plan: Symptoms inadequately controlled by current medications, particularly at night. Discussed increasing ropinirole dosage and reviewing  medication list for other potential treatments. - Increase ropinirole dosage - Review medication list for other treatments for restless leg syndrome  Orders: -     rOPINIRole HCl; Take 1 tablet (0.5 mg total) by mouth 3 (three) times daily.  Dispense: 90 tablet; Refill: 1  Coronary artery disease of native artery of native heart with stable angina pectoris [facility]) Assessment & Plan: Noted.  No acute concerns.  Continue to monitor. - continue rosuvastatin and metoprolol.   Dysphagia, unspecified type Assessment & Plan: Doing well with PEG and reports she has more energy. No acute concerns.  Continue to monitor.   Tremor Assessment & Plan: Worsening tremors and difficulty managing symptoms with current ropinirole dosage. Discussed increasing dosage to better manage tremors and restless leg syndrome, noting potential side effects such as dizziness and nausea. - Increase ropinirole dosage  Orders: -     rOPINIRole HCl; Take 1 tablet (0.5 mg total) by mouth 3 (three) times daily.  Dispense: 90 tablet; Refill: 1  Severe depression (HCC) Assessment & Plan: Well-controlled. Continue buspar 5 mg three times daily as needed   Chronic obstructive pulmonary disease with (acute) exacerbation (HCC) Assessment & Plan: Persistent cough since January, partially responsive to azithromycin and guaifenesin. Differential includes bronchitis and pneumonia. Given her lung cancer history, cough may be exacerbated by underlying malignancy. Discussed risks and benefits of continuing guaifenesin and starting methylprednisolone and cefdinir to address inflammation and probable bacterial infection, aiming to resolve infection and reduce cough frequency and severity. - Prescribe methylprednisolone pack - Prescribe cefdinir - Continue guaifenesin  Non-compliance with inhalers. Has Duonebs and nebulized albuterol prescribed by Pulmonology. Oxygen therapy available if needed. Emphasized importance of regular  inhaler use to prevent exacerbations and improve lung function. - Encourage regular use of daily inhaler - Continue oxygen therapy as needed   General Health Maintenance Multiple medications and follow-ups with various specialists. Recent blood work done. Emphasized importance of medication reconciliation to ensure all prescriptions are current and necessary. - Review and update medication list - Follow up with oncology and radiation specialists as scheduled   Return in about 3 months (around 10/04/2023), or if symptoms worsen or fail to improve,  for chronic f/u.      I discussed the assessment and treatment plan with the patient  The patient was provided an opportunity to ask questions and all were answered. The patient agreed with the plan and demonstrated an understanding of the instructions.   The patient was advised to call back or seek an in-person evaluation if the symptoms worsen or if the condition fails to improve as anticipated.    Sherlyn Hay, DO  [facility] [phone] (phone) [phone] (fax)  [facility]

## 2023-07-04 NOTE — Assessment & Plan Note (Signed)
 Worsening tremors and difficulty managing symptoms with current ropinirole dosage. Discussed increasing dosage to better manage tremors and restless leg syndrome, noting potential side effects such as dizziness and nausea. - Increase ropinirole dosage

## 2023-07-04 NOTE — Assessment & Plan Note (Addendum)
 Persistent cough since January, partially responsive to azithromycin and guaifenesin. Differential includes bronchitis and pneumonia. Given her lung cancer history, cough may be exacerbated by underlying malignancy. Discussed risks and benefits of continuing guaifenesin and starting methylprednisolone and cefdinir to address inflammation and probable bacterial infection, aiming to resolve infection and reduce cough frequency and severity. - Prescribe methylprednisolone pack - Prescribe cefdinir - Continue guaifenesin  Non-compliance with inhalers. Has Duonebs and nebulized albuterol prescribed by Pulmonology. Oxygen therapy available if needed. Emphasized importance of regular inhaler use to prevent exacerbations and improve lung function. - Encourage regular use of daily inhaler - Continue oxygen therapy as needed

## 2023-07-04 NOTE — Assessment & Plan Note (Signed)
 Doing well with PEG and reports she has more energy. No acute concerns.  Continue to monitor.

## 2023-07-05 DIAGNOSIS — J449 Chronic obstructive pulmonary disease, unspecified: Secondary | ICD-10-CM | POA: Diagnosis not present

## 2023-07-05 DIAGNOSIS — R1310 Dysphagia, unspecified: Secondary | ICD-10-CM | POA: Diagnosis not present

## 2023-07-05 DIAGNOSIS — I5032 Chronic diastolic (congestive) heart failure: Secondary | ICD-10-CM | POA: Diagnosis not present

## 2023-07-05 DIAGNOSIS — I480 Paroxysmal atrial fibrillation: Secondary | ICD-10-CM | POA: Diagnosis not present

## 2023-07-05 DIAGNOSIS — I700 Atherosclerosis of aorta: Secondary | ICD-10-CM | POA: Diagnosis not present

## 2023-07-05 DIAGNOSIS — I2510 Atherosclerotic heart disease of native coronary artery without angina pectoris: Secondary | ICD-10-CM | POA: Diagnosis not present

## 2023-07-05 DIAGNOSIS — I4892 Unspecified atrial flutter: Secondary | ICD-10-CM | POA: Diagnosis not present

## 2023-07-05 DIAGNOSIS — E440 Moderate protein-calorie malnutrition: Secondary | ICD-10-CM | POA: Diagnosis not present

## 2023-07-05 DIAGNOSIS — Z431 Encounter for attention to gastrostomy: Secondary | ICD-10-CM | POA: Diagnosis not present

## 2023-07-06 DIAGNOSIS — R1310 Dysphagia, unspecified: Secondary | ICD-10-CM | POA: Diagnosis not present

## 2023-07-06 DIAGNOSIS — I4892 Unspecified atrial flutter: Secondary | ICD-10-CM | POA: Diagnosis not present

## 2023-07-06 DIAGNOSIS — E440 Moderate protein-calorie malnutrition: Secondary | ICD-10-CM | POA: Diagnosis not present

## 2023-07-06 DIAGNOSIS — I700 Atherosclerosis of aorta: Secondary | ICD-10-CM | POA: Diagnosis not present

## 2023-07-06 DIAGNOSIS — I480 Paroxysmal atrial fibrillation: Secondary | ICD-10-CM | POA: Diagnosis not present

## 2023-07-06 DIAGNOSIS — Z431 Encounter for attention to gastrostomy: Secondary | ICD-10-CM | POA: Diagnosis not present

## 2023-07-06 DIAGNOSIS — I5032 Chronic diastolic (congestive) heart failure: Secondary | ICD-10-CM | POA: Diagnosis not present

## 2023-07-06 DIAGNOSIS — J449 Chronic obstructive pulmonary disease, unspecified: Secondary | ICD-10-CM | POA: Diagnosis not present

## 2023-07-06 DIAGNOSIS — I2510 Atherosclerotic heart disease of native coronary artery without angina pectoris: Secondary | ICD-10-CM | POA: Diagnosis not present

## 2023-07-12 DIAGNOSIS — R1310 Dysphagia, unspecified: Secondary | ICD-10-CM | POA: Diagnosis not present

## 2023-07-12 DIAGNOSIS — I480 Paroxysmal atrial fibrillation: Secondary | ICD-10-CM | POA: Diagnosis not present

## 2023-07-12 DIAGNOSIS — I4892 Unspecified atrial flutter: Secondary | ICD-10-CM | POA: Diagnosis not present

## 2023-07-12 DIAGNOSIS — E440 Moderate protein-calorie malnutrition: Secondary | ICD-10-CM | POA: Diagnosis not present

## 2023-07-12 DIAGNOSIS — I2510 Atherosclerotic heart disease of native coronary artery without angina pectoris: Secondary | ICD-10-CM | POA: Diagnosis not present

## 2023-07-12 DIAGNOSIS — I5032 Chronic diastolic (congestive) heart failure: Secondary | ICD-10-CM | POA: Diagnosis not present

## 2023-07-12 DIAGNOSIS — J449 Chronic obstructive pulmonary disease, unspecified: Secondary | ICD-10-CM | POA: Diagnosis not present

## 2023-07-12 DIAGNOSIS — I700 Atherosclerosis of aorta: Secondary | ICD-10-CM | POA: Diagnosis not present

## 2023-07-12 DIAGNOSIS — Z431 Encounter for attention to gastrostomy: Secondary | ICD-10-CM | POA: Diagnosis not present

## 2023-07-14 DIAGNOSIS — I2510 Atherosclerotic heart disease of native coronary artery without angina pectoris: Secondary | ICD-10-CM | POA: Diagnosis not present

## 2023-07-14 DIAGNOSIS — E440 Moderate protein-calorie malnutrition: Secondary | ICD-10-CM | POA: Diagnosis not present

## 2023-07-14 DIAGNOSIS — I700 Atherosclerosis of aorta: Secondary | ICD-10-CM | POA: Diagnosis not present

## 2023-07-14 DIAGNOSIS — Z431 Encounter for attention to gastrostomy: Secondary | ICD-10-CM | POA: Diagnosis not present

## 2023-07-14 DIAGNOSIS — I5032 Chronic diastolic (congestive) heart failure: Secondary | ICD-10-CM | POA: Diagnosis not present

## 2023-07-14 DIAGNOSIS — R1310 Dysphagia, unspecified: Secondary | ICD-10-CM | POA: Diagnosis not present

## 2023-07-14 DIAGNOSIS — I480 Paroxysmal atrial fibrillation: Secondary | ICD-10-CM | POA: Diagnosis not present

## 2023-07-14 DIAGNOSIS — J449 Chronic obstructive pulmonary disease, unspecified: Secondary | ICD-10-CM | POA: Diagnosis not present

## 2023-07-14 DIAGNOSIS — I4892 Unspecified atrial flutter: Secondary | ICD-10-CM | POA: Diagnosis not present

## 2023-07-17 ENCOUNTER — Other Ambulatory Visit: Payer: Self-pay | Admitting: Family Medicine

## 2023-07-17 DIAGNOSIS — R52 Pain, unspecified: Secondary | ICD-10-CM

## 2023-07-18 ENCOUNTER — Ambulatory Visit: Payer: Medicare HMO | Admitting: Family Medicine

## 2023-07-18 NOTE — Telephone Encounter (Signed)
 Requested Prescriptions  Pending Prescriptions Disp Refills   meloxicam (MOBIC) 7.5 MG tablet [Pharmacy Med Name: MELOXICAM 7.5 MG TAB] 90 tablet 1    Sig: TAKE 1 TABLET BY MOUTH DAILY     Analgesics:  COX2 Inhibitors Failed - 07/18/2023  4:16 PM      Failed - Manual Review: Labs are only required if the patient has taken medication for more than 8 weeks.      Passed - HGB in normal range and within 360 days    Hemoglobin  Date Value Ref Range Status  06/28/2023 12.9 12.0 - 15.0 g/dL Final  16/01/9603 54.0 11.1 - 15.9 g/dL Final         Passed - Cr in normal range and within 360 days    Creatinine  Date Value Ref Range Status  06/28/2023 0.80 0.44 - 1.00 mg/dL Final  98/03/9146 8.29 0.60 - 1.30 mg/dL Final         Passed - HCT in normal range and within 360 days    HCT  Date Value Ref Range Status  06/28/2023 40.1 36.0 - 46.0 % Final   Hematocrit  Date Value Ref Range Status  11/03/2021 42.1 34.0 - 46.6 % Final         Passed - AST in normal range and within 360 days    AST  Date Value Ref Range Status  06/28/2023 21 15 - 41 U/L Final         Passed - ALT in normal range and within 360 days    ALT  Date Value Ref Range Status  06/28/2023 11 0 - 44 U/L Final   SGPT (ALT)  Date Value Ref Range Status  11/26/2012 21 12 - 78 U/L Final         Passed - eGFR is 30 or above and within 360 days    EGFR (African American)  Date Value Ref Range Status  04/08/2014 >60 >45mL/min Final  11/26/2012 >60  Final   GFR calc Af Amer  Date Value Ref Range Status  01/20/2020 >60 >60 mL/min Final   EGFR (Non-African Amer.)  Date Value Ref Range Status  04/08/2014 >60 >76mL/min Final    Comment:    eGFR values <52mL/min/1.73 m2 may be an indication of chronic kidney disease (CKD). Calculated eGFR, using the MRDR Study equation, is useful in  patients with stable renal function. The eGFR calculation will not be reliable in acutely ill patients when serum creatinine is  changing rapidly. It is not useful in patients on dialysis. The eGFR calculation may not be applicable to patients at the low and high extremes of body sizes, pregnant women, and vegetarians.   11/26/2012 >60  Final    Comment:    eGFR values <12mL/min/1.73 m2 may be an indication of chronic kidney disease (CKD). Calculated eGFR is useful in patients with stable renal function. The eGFR calculation will not be reliable in acutely ill patients when serum creatinine is changing rapidly. It is not useful in  patients on dialysis. The eGFR calculation may not be applicable to patients at the low and high extremes of body sizes, pregnant women, and vegetarians.    GFR, Estimated  Date Value Ref Range Status  06/28/2023 >60 >60 mL/min Final    Comment:    (NOTE) Calculated using the CKD-EPI Creatinine Equation (2021)    eGFR  Date Value Ref Range Status  11/03/2021 84 >59 mL/min/1.73 Final         Passed -  Patient is not pregnant      Passed - Valid encounter within last 12 months    Recent Outpatient Visits           4 months ago Spasm of thoracic back muscle   [facility] [place], Monico Blitz, DO   5 months ago Gastroesophageal reflux disease with esophagitis without hemorrhage   [facility] Jacky Kindle, FNP   6 months ago Chronic cough   [facility] Jacky Kindle, FNP   6 months ago Anxiety about health   [facility] Jacky Kindle, FNP   7 months ago Uncontrolled pain   [facility] Jacky Kindle, [place]

## 2023-07-19 DIAGNOSIS — R1310 Dysphagia, unspecified: Secondary | ICD-10-CM | POA: Diagnosis not present

## 2023-07-19 DIAGNOSIS — E440 Moderate protein-calorie malnutrition: Secondary | ICD-10-CM | POA: Diagnosis not present

## 2023-07-19 DIAGNOSIS — Z431 Encounter for attention to gastrostomy: Secondary | ICD-10-CM | POA: Diagnosis not present

## 2023-07-19 DIAGNOSIS — I480 Paroxysmal atrial fibrillation: Secondary | ICD-10-CM | POA: Diagnosis not present

## 2023-07-19 DIAGNOSIS — I4892 Unspecified atrial flutter: Secondary | ICD-10-CM | POA: Diagnosis not present

## 2023-07-19 DIAGNOSIS — I2510 Atherosclerotic heart disease of native coronary artery without angina pectoris: Secondary | ICD-10-CM | POA: Diagnosis not present

## 2023-07-19 DIAGNOSIS — I5032 Chronic diastolic (congestive) heart failure: Secondary | ICD-10-CM | POA: Diagnosis not present

## 2023-07-19 DIAGNOSIS — I700 Atherosclerosis of aorta: Secondary | ICD-10-CM | POA: Diagnosis not present

## 2023-07-19 DIAGNOSIS — J449 Chronic obstructive pulmonary disease, unspecified: Secondary | ICD-10-CM | POA: Diagnosis not present

## 2023-07-20 ENCOUNTER — Ambulatory Visit: Payer: Self-pay

## 2023-07-20 DIAGNOSIS — E440 Moderate protein-calorie malnutrition: Secondary | ICD-10-CM | POA: Diagnosis not present

## 2023-07-20 DIAGNOSIS — I4892 Unspecified atrial flutter: Secondary | ICD-10-CM | POA: Diagnosis not present

## 2023-07-20 DIAGNOSIS — I480 Paroxysmal atrial fibrillation: Secondary | ICD-10-CM | POA: Diagnosis not present

## 2023-07-20 DIAGNOSIS — Z431 Encounter for attention to gastrostomy: Secondary | ICD-10-CM | POA: Diagnosis not present

## 2023-07-20 DIAGNOSIS — I5032 Chronic diastolic (congestive) heart failure: Secondary | ICD-10-CM | POA: Diagnosis not present

## 2023-07-20 DIAGNOSIS — I700 Atherosclerosis of aorta: Secondary | ICD-10-CM | POA: Diagnosis not present

## 2023-07-20 DIAGNOSIS — J449 Chronic obstructive pulmonary disease, unspecified: Secondary | ICD-10-CM | POA: Diagnosis not present

## 2023-07-20 DIAGNOSIS — R1310 Dysphagia, unspecified: Secondary | ICD-10-CM | POA: Diagnosis not present

## 2023-07-20 DIAGNOSIS — I2510 Atherosclerotic heart disease of native coronary artery without angina pectoris: Secondary | ICD-10-CM | POA: Diagnosis not present

## 2023-07-20 NOTE — Telephone Encounter (Signed)
 Copied from CRM [id number]. Topic: Clinical - Red Word Triage >> Jul 20, 2023 10:57 AM Payton Doughty wrote: Red Word that prompted transfer to Nurse Triage: Harvie Heck w/ [facility] home [facility] calling to advise the pt is still having wheezing, left upper and lower lobe. Pt weighted 107 lbs today,  last visit she was 110 lbs on 07/06/2023 Cb [phone]   Harvie Heck is no longer w/ the pt.  He just left several minutes ago.   Chief Complaint: Cough Symptoms: Cough, some wheezing  Frequency: Frequent  Pertinent Negatives: Patient denies fever or difficulty breathing  Disposition: [] ED /[] Urgent Care (no appt availability in office) / [x] Appointment(In office/virtual)/ []  [facility]/ [] Home Care/ [] Refused Recommended Disposition /[] [facility]/ []  Follow-up with PCP Additional Notes: Patient was seen on 07/04/23 for a persistent cough that has been present since January. She states that she was prescribed medication which she has taken but states that her cough is still present. She states that she also continues to have some wheezing with her cough. Patient denies any difficulty breathing or fever. Appointment scheduled for the patient tomorrow for evaluation an treatment.    Reason for Disposition  Wheezing is present    Wheezing has been ongoing, appointment scheduled for tomorrow  Answer Assessment - Initial Assessment Questions 1. ONSET: "When did the cough begin?"      Ongoing for a while  2. SEVERITY: "How bad is the cough today?"      Moderate  3. SPUTUM: "Describe the color of your sputum" (none, dry cough; clear, white, yellow, green)     Occasional  4. HEMOPTYSIS: "Are you coughing up any blood?" If so ask: "How much?" (flecks, streaks, tablespoons, etc.)     No 5. DIFFICULTY BREATHING: "Are you having difficulty breathing?" If Yes, ask: "How bad is it?" (e.g., mild, moderate, severe)    - MILD: No SOB at rest, mild SOB with walking, speaks normally in sentences, can lie  down, no retractions, pulse < 100.    - MODERATE: SOB at rest, SOB with minimal exertion and prefers to sit, cannot lie down flat, speaks in phrases, mild retractions, audible wheezing, pulse 100-120.    - SEVERE: Very SOB at rest, speaks in single words, struggling to breathe, sitting hunched forward, retractions, pulse > 120      No 6. FEVER: "Do you have a fever?" If Yes, ask: "What is your temperature, how was it measured, and when did it start?"     No 7. CARDIAC HISTORY: "Do you have any history of heart disease?" (e.g., heart attack, congestive heart failure)      CHF 8. LUNG HISTORY: "Do you have any history of lung disease?"  (e.g., pulmonary embolus, asthma, emphysema)     COPD 9. PE RISK FACTORS: "Do you have a history of blood clots?" (or: recent major surgery, recent prolonged travel, bedridden)     DVT 10. OTHER SYMPTOMS: "Do you have any other symptoms?" (e.g., runny nose, wheezing, chest pain)       Wheezing, fatigue  Protocols used: Cough - Acute Non-Productive-A-AH

## 2023-07-21 ENCOUNTER — Ambulatory Visit: Admitting: Family Medicine

## 2023-07-21 ENCOUNTER — Ambulatory Visit (INDEPENDENT_AMBULATORY_CARE_PROVIDER_SITE_OTHER): Admitting: Family Medicine

## 2023-07-21 ENCOUNTER — Other Ambulatory Visit: Payer: Self-pay | Admitting: Nurse Practitioner

## 2023-07-21 ENCOUNTER — Ambulatory Visit
Admission: RE | Admit: 2023-07-21 | Discharge: 2023-07-21 | Disposition: A | Discharge status: Home / Self Care | Source: Ambulatory Visit | Attending: Family Medicine | Admitting: Family Medicine

## 2023-07-21 ENCOUNTER — Encounter: Payer: Self-pay | Admitting: Family Medicine

## 2023-07-21 ENCOUNTER — Ambulatory Visit
Admission: RE | Admit: 2023-07-21 | Discharge: 2023-07-21 | Disposition: A | Discharge status: Home / Self Care | Attending: Family Medicine | Admitting: Family Medicine

## 2023-07-21 VITALS — BP 100/58 | HR 80 | Ht 61.0 in | Wt 114.7 lb

## 2023-07-21 DIAGNOSIS — I5032 Chronic diastolic (congestive) heart failure: Secondary | ICD-10-CM | POA: Diagnosis not present

## 2023-07-21 DIAGNOSIS — R0989 Other specified symptoms and signs involving the circulatory and respiratory systems: Secondary | ICD-10-CM

## 2023-07-21 DIAGNOSIS — C3490 Malignant neoplasm of unspecified part of unspecified bronchus or lung: Secondary | ICD-10-CM | POA: Diagnosis not present

## 2023-07-21 DIAGNOSIS — I700 Atherosclerosis of aorta: Secondary | ICD-10-CM | POA: Diagnosis not present

## 2023-07-21 DIAGNOSIS — I2510 Atherosclerotic heart disease of native coronary artery without angina pectoris: Secondary | ICD-10-CM | POA: Diagnosis not present

## 2023-07-21 DIAGNOSIS — J441 Chronic obstructive pulmonary disease with (acute) exacerbation: Secondary | ICD-10-CM

## 2023-07-21 DIAGNOSIS — R1310 Dysphagia, unspecified: Secondary | ICD-10-CM | POA: Diagnosis not present

## 2023-07-21 DIAGNOSIS — I480 Paroxysmal atrial fibrillation: Secondary | ICD-10-CM | POA: Diagnosis not present

## 2023-07-21 DIAGNOSIS — T17800D Unspecified foreign body in other parts of respiratory tract causing asphyxiation, subsequent encounter: Secondary | ICD-10-CM

## 2023-07-21 DIAGNOSIS — R053 Chronic cough: Secondary | ICD-10-CM | POA: Diagnosis not present

## 2023-07-21 DIAGNOSIS — Z7189 Other specified counseling: Secondary | ICD-10-CM

## 2023-07-21 DIAGNOSIS — I4892 Unspecified atrial flutter: Secondary | ICD-10-CM | POA: Diagnosis not present

## 2023-07-21 DIAGNOSIS — E440 Moderate protein-calorie malnutrition: Secondary | ICD-10-CM | POA: Diagnosis not present

## 2023-07-21 DIAGNOSIS — J449 Chronic obstructive pulmonary disease, unspecified: Secondary | ICD-10-CM | POA: Diagnosis not present

## 2023-07-21 DIAGNOSIS — Z431 Encounter for attention to gastrostomy: Secondary | ICD-10-CM | POA: Diagnosis not present

## 2023-07-21 LAB — POC COVID19/FLU A&B COMBO
Covid Antigen, POC: NEGATIVE
Influenza A Antigen, POC: NEGATIVE
Influenza B Antigen, POC: NEGATIVE

## 2023-07-21 MED ORDER — TIOTROPIUM BROMIDE-OLODATEROL 2.5-2.5 MCG/ACT IN AERS: 2.0000 | INHALATION_SPRAY | Freq: Every day | RESPIRATORY_TRACT | 5 refills | Status: DC

## 2023-07-21 MED ORDER — ALBUTEROL SULFATE HFA 108 (90 BASE) MCG/ACT IN AERS: 2.0000 | INHALATION_SPRAY | Freq: Four times a day (QID) | RESPIRATORY_TRACT | 2 refills | Status: AC | PRN

## 2023-07-21 NOTE — Assessment & Plan Note (Signed)
 Off treatments now because wasn't doing well

## 2023-07-21 NOTE — Patient Instructions (Addendum)
 Check and see if you are definitely still taking benzonatate (Tessalon pearl for cough).  Symbicort, Dulera, Advair (diskus or HFA), Wixela, Stiolto, Breo Ellipta, Anoro Ellipta   Use your incentive spirometer to take 10 breaths every hour while awake.  Please avoid liquids for at least a couple days to see if this helps improve your cough as I suspect part of the liquid is going into your lungs.

## 2023-07-21 NOTE — Progress Notes (Deleted)
 ACUTE PATIENT VISIT    Patient: Denise Watts   DOB: 1956-02-22   68 y.o. Female  MRN: [id number] Visit Date: 07/21/2023  Today's healthcare provider: Ronnald Ramp, MD   PCP: Sherlyn Hay, DO   No chief complaint on file.   Subjective       Discussed the use of AI scribe software for clinical note transcription with the patient, who gave verbal consent to proceed.  History of Present Illness             Past Medical History:  Diagnosis Date   Acute pulmonary embolism (HCC) 05/10/2023   Adenomatous colon polyp    Adrenal carcinoma, right (HCC) 12/28/2016   a.) high grade carcinoma with extensive necrosis --> s/p adrenalectomy   Anxiety    Aortic atherosclerosis (HCC)    Arthritis of right knee    Atrial fibrillation and flutter (HCC)    a.) CHA2DS2-VASc = 3 (age, sex, vascular disease history) as of 02/13/2023; b.) cardiac rate/rhythm maintained intrinsically without pharmacological intervention; no chronic OAC (does take low dose ASA)   B12 deficiency    Breast cancer (HCC) 1997   a.) stage IB (cT1a, cN0, cM0, G3, ER+, PR-, HER2-); s/p lumpectomy + XRT   COPD (chronic obstructive pulmonary disease) (HCC)    Coronary artery disease    a.) cCTA 12/09/2021: Ca2+ = 135 (80th %'ile for age/sex/race match contol) --> 25-49% pLAD and RCA distributions   Depression    Diastolic dysfunction    a.) TTE 12/11/2020, no RWMAs, norm RVSF, G1DD; b.) TTE 12/14/2021: EF 55-60, no RWMAs, norm RVSF, G1DD; c.) TTE 01/27/2023: EF 50-55%, no RWMAs, G1DD, mild RAE, mild MR, mod TR   Dry cough 03/02/2017   Dyspnea    Full dentures    GERD (gastroesophageal reflux disease)    History of hiatal hernia    HLD (hyperlipidemia)    Invasive carcinoma of breast (HCC) 11/15/2017   a.) path (+) for G3, ER -, PR -, HER2/neu - ; s/p lumpectomy 01/17/2018 + 4 cycles TC systemic chemotherapy + XRT   Long term current use of aspirin    Personal history of chemotherapy     Personal history of radiation therapy    Pneumothorax of left lung after biopsy 01/25/2023   a.) hypoxic in PACU requiring re-intubation --> transferred to ICU --> chest tube placed by PCCM   RLS (restless legs syndrome)    a.) on ropinirole   Skin cancer    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   benzonatate (TESSALON) 100 MG capsule Take 2 capsules (200 mg total) by mouth 3 (three) times daily as needed for cough.   bisacodyl (DULCOLAX) 10 MG suppository Place 1 suppository (10 mg total) rectally daily as needed for moderate constipation or severe constipation.   busPIRone (BUSPAR) 5 MG tablet Take 1 tablet (5 mg total) by mouth 3 (three) times daily as needed.   cefdinir (OMNICEF) 300 MG capsule Take 1 capsule (300 mg total) by mouth 2 (two) times daily.   dextromethorphan-guaiFENesin (MUCINEX DM) 30-600 MG 12hr tablet Take 1 tablet by mouth 2 (two) times daily as needed for cough.   fentaNYL (DURAGESIC) 12 MCG/HR Place 1 patch onto  the skin every 3 (three) days.   lactulose (CHRONULAC) 10 GM/15ML solution Place 30 mLs (20 g total) into feeding tube 2 (two) times daily.   lidocaine (LIDODERM) 5 % Place 1 patch onto the skin daily. Remove & Discard patch within 12 hours or as directed by MD   meloxicam (MOBIC) 7.5 MG tablet TAKE 1 TABLET BY MOUTH DAILY   methylPREDNISolone (MEDROL DOSEPAK) 4 MG TBPK tablet Take 6 pills on day 1, 5 pills on day 2, 4 pills on day 3, 3 pills on day 4, 2 pills on day 5, 1 pill on day 6   metoprolol succinate (TOPROL-XL) 25 MG 24 hr tablet Take 1 tablet (25 mg total) by mouth daily.   Nutritional Supplements (NUTREN 1.5) LIQD Give 1 carton 4 times a day (8am, noon, 4pm and 8pm). Flush with 60ml of water before and after each feeding.   Give additional 1 1/2 cups of water a day via tube or drink orally for adequate hydration.   omeprazole (KONVOMEP) 2 mg/mL SUSP oral suspension Take 20 mLs (40 mg total) by mouth daily.   ondansetron (ZOFRAN) 4 MG tablet Take 1 tablet (4 mg total) by mouth every 6 (six) hours as needed for nausea or vomiting.   oxyCODONE 7.5 MG TABS Place 7.5 mg into feeding tube every 4 (four) hours as needed for severe pain (pain score 7-10) or moderate pain (pain score 4-6).   rOPINIRole (REQUIP) 0.5 MG tablet Take 1 tablet (0.5 mg total) by mouth 3 (three) times daily.   rosuvastatin (CRESTOR) 40 MG tablet Place 1 tablet (40 mg total) into feeding tube daily.   senna (SENOKOT) 8.6 MG TABS tablet Place 1 tablet (8.6 mg total) into feeding tube daily.   simethicone (MYLICON) 80 MG chewable tablet Place 1 tablet (80 mg total) into feeding tube 4 (four) times daily.   sucralfate (CARAFATE) 1 GM/10ML suspension Take 10 mLs (1 g total) by mouth 4 (four) times daily.   triamcinolone cream (KENALOG) 0.1 % Apply 1 Application topically 2 (two) times daily.   Water For Irrigation, Sterile (FREE WATER) SOLN Place 60 mLs into feeding tube 5 (five) times daily.   Facility-Administered Medications Prior to Visit  Medication Dose Route Frequency Provider   cyanocobalamin ((VITAMIN B-12)) injection 1,000 mcg  1,000 mcg Intramuscular Q30 days Creig Hines, MD   cyanocobalamin (VITAMIN B12) injection 1,000 mcg  1,000 mcg Intramuscular Q30 days Creig Hines, MD    Review of Systems  {Insert previous labs (optional):23779} {See past labs  Heme  Chem  Endocrine  Serology  Results Review (optional):1}   Objective    There were no vitals taken for this visit. {Insert last BP/Wt (optional):23777}{See vitals history (optional):1}    Physical Exam  ***  No results found for any visits on 07/21/23.  Assessment & Plan     Problem List Items Addressed This Visit   None    Assessment and Plan               No follow-ups on file.         Ronnald Ramp, MD  [facility] [phone] (phone) [phone] (fax)  [facility]

## 2023-07-21 NOTE — Progress Notes (Signed)
 Established patient visit   Patient: Denise Watts   DOB: 19-Feb-1956   68 y.o. Female  MRN: [id number] Visit Date: 07/21/2023  Today's healthcare provider: Sherlyn Hay, DO   Chief Complaint  Patient presents with   Cough    Cough and wheezing has not improved with treatment  Coughs up flem sometimes but sometimes can be a dry cough Pain is 5/10 when coughing in the back and chest Has runny nose and headache    Subjective    Cough  Denise Watts is a 68 year old female with lung cancer who presents with chronic cough and difficulty swallowing.  She has been experiencing a chronic cough since before December 2024, which has progressively worsened. The cough is notably triggered by drinking water, leading to immediate coughing. Despite using various treatments, including methylprednisolone, her symptoms have not resolved. She has been on multiple antibiotics such as cefdinir, doxycycline, azithromycin, cefadroxil, and Levaquin, but the cough persists. She continues to use benzonatate for cough relief. - Received levoquin in 04/2023 - Received doyxcycline and cefadroxil 05/08/2023 both for 6 days - Azithromycin 06/19/2023  She experiences difficulty swallowing, ongoing since at least January 2025, resulting in the placement of a G-tube. Despite this, she continues to eat by mouth but reports a lack of appetite and difficulty swallowing, sometimes leading to aspiration. She has lost approximately thirty pounds and was previously admitted for dysphagia and aspiration pneumonia.  She has a history of lung cancer and is currently off treatments due to weakness and illness. She has not seen her pulmonologist recently and is not under active treatment for her lung cancer.  Her current medications include montelukast, lactulose, and Eliquis. She has several inhalers at home, including albuterol and Breztri, but they may be expired. She has not been using her nebulizer recently as she  felt her breathing was good, but she acknowledges persistent wheezing and coughing.  In the review of symptoms, no fever but occasional chills and hot flashes. She also notes swelling in her legs, particularly at night, and purple discoloration of her toes.     Medications: Outpatient Medications Prior to Visit  Medication Sig   apixaban (ELIQUIS) 5 MG TABS tablet Take 1 tablet (5 mg total) by mouth 2 (two) times daily.   benzonatate (TESSALON) 100 MG capsule Take 2 capsules (200 mg total) by mouth 3 (three) times daily as needed for cough.   bisacodyl (DULCOLAX) 10 MG suppository Place 1 suppository (10 mg total) rectally daily as needed for moderate constipation or severe constipation.   cefdinir (OMNICEF) 300 MG capsule Take 1 capsule (300 mg total) by mouth 2 (two) times daily.   dextromethorphan-guaiFENesin (MUCINEX DM) 30-600 MG 12hr tablet Take 1 tablet by mouth 2 (two) times daily as needed for cough.   lactulose (CHRONULAC) 10 GM/15ML solution Place 30 mLs (20 g total) into feeding tube 2 (two) times daily.   lidocaine (LIDODERM) 5 % Place 1 patch onto the skin daily. Remove & Discard patch within 12 hours or as directed by MD   meloxicam (MOBIC) 7.5 MG tablet TAKE 1 TABLET BY MOUTH DAILY   methylPREDNISolone (MEDROL DOSEPAK) 4 MG TBPK tablet Take 6 pills on day 1, 5 pills on day 2, 4 pills on day 3, 3 pills on day 4, 2 pills on day 5, 1 pill on day 6   montelukast (SINGULAIR) 10 MG tablet Take 10 mg by mouth daily.   Nutritional Supplements (NUTREN 1.5)  LIQD Give 1 carton 4 times a day (8am, noon, 4pm and 8pm). Flush with 60ml of water before and after each feeding.  Give additional 1 1/2 cups of water a day via tube or drink orally for adequate hydration.   omeprazole (KONVOMEP) 2 mg/mL SUSP oral suspension Take 20 mLs (40 mg total) by mouth daily.   ondansetron (ZOFRAN) 4 MG tablet Take 1 tablet (4 mg total) by mouth every 6 (six) hours as needed for nausea or vomiting.   oxyCODONE  7.5 MG TABS Place 7.5 mg into feeding tube every 4 (four) hours as needed for severe pain (pain score 7-10) or moderate pain (pain score 4-6).   rOPINIRole (REQUIP) 0.5 MG tablet Take 1 tablet (0.5 mg total) by mouth 3 (three) times daily.   rosuvastatin (CRESTOR) 40 MG tablet Place 1 tablet (40 mg total) into feeding tube daily.   senna (SENOKOT) 8.6 MG TABS tablet Place 1 tablet (8.6 mg total) into feeding tube daily.   simethicone (MYLICON) 80 MG chewable tablet Place 1 tablet (80 mg total) into feeding tube 4 (four) times daily.   triamcinolone cream (KENALOG) 0.1 % Apply 1 Application topically 2 (two) times daily.   Water For Irrigation, Sterile (FREE WATER) SOLN Place 60 mLs into feeding tube 5 (five) times daily.   [DISCONTINUED] busPIRone (BUSPAR) 5 MG tablet Take 1 tablet (5 mg total) by mouth 3 (three) times daily as needed.   [DISCONTINUED] fentaNYL (DURAGESIC) 12 MCG/HR Place 1 patch onto the skin every 3 (three) days.   [DISCONTINUED] metoprolol succinate (TOPROL-XL) 25 MG 24 hr tablet Take 1 tablet (25 mg total) by mouth daily.   [DISCONTINUED] sucralfate (CARAFATE) 1 GM/10ML suspension Take 10 mLs (1 g total) by mouth 4 (four) times daily.   Facility-Administered Medications Prior to Visit  Medication Dose Route Frequency Provider   cyanocobalamin ((VITAMIN B-12)) injection 1,000 mcg  1,000 mcg Intramuscular Q30 days Creig Hines, MD   cyanocobalamin (VITAMIN B12) injection 1,000 mcg  1,000 mcg Intramuscular Q30 days Creig Hines, MD        Objective    BP (!) 100/58   Pulse 80   Ht 5\' 1"  (1.549 m)   Wt 114 lb 11.2 oz (52 kg)   SpO2 100%   BMI 21.67 kg/m     Physical Exam Vitals reviewed.  Constitutional:      General: She is not in acute distress.    Appearance: Normal appearance. She is well-developed. She is not diaphoretic.  HENT:     Head: Normocephalic and atraumatic.     Right Ear: Tympanic membrane, ear canal and external ear normal.     Left Ear:  Tympanic membrane, ear canal and external ear normal.     Nose: Nose normal.     Mouth/Throat:     Mouth: Mucous membranes are moist.     Pharynx: Oropharynx is clear. No oropharyngeal exudate.  Eyes:     General: No scleral icterus.    Conjunctiva/sclera: Conjunctivae normal.     Pupils: Pupils are equal, round, and reactive to light.  Cardiovascular:     Rate and Rhythm: Normal rate and regular rhythm.     Pulses: Normal pulses.     Heart sounds: Normal heart sounds. No murmur heard. Pulmonary:     Effort: Pulmonary effort is normal. No respiratory distress.     Breath sounds: No decreased air movement. Examination of the right-upper field reveals wheezing. Examination of the left-upper field reveals wheezing.  Examination of the right-lower field reveals rales. Wheezing (expiratory) and rales present.  Musculoskeletal:     Cervical back: Neck supple.     Right lower leg: No edema.     Left lower leg: No edema.  Lymphadenopathy:     Cervical: No cervical adenopathy.  Skin:    General: Skin is warm and dry.     Findings: No rash.  Neurological:     Mental Status: She is alert.      Results for orders placed or performed in visit on 07/21/23  POC Covid19/Flu A&B Antigen  Result Value Ref Range   Influenza A Antigen, POC Negative Negative   Influenza B Antigen, POC Negative Negative   Covid Antigen, POC Negative Negative    Assessment & Plan    Chronic cough -     Pulmonary Visit  Symptoms of upper respiratory infection (URI) -     POC Covid19/Flu A&B Antigen -     DG Chest 2 View; Future  COPD, frequent exacerbations (HCC) -     Albuterol Sulfate HFA; Inhale 2 puffs into the lungs every 6 (six) hours as needed for wheezing or shortness of breath.  Dispense: 51 each; Refill: 2 -     Tiotropium Bromide-Olodaterol; Inhale 2 puffs into the lungs daily.  Dispense: 4 g; Refill: 5 -     Pulmonary Visit  Non-small cell lung cancer, unspecified laterality (HCC) Assessment &  Plan: Off treatments now because wasn't doing well  Orders: -     Pulmonary Visit  Aspiration into lower respiratory tract, subsequent encounter -     Pulmonary Visit  Goals of care, counseling/discussion     Chronic Cough and Wheezing Persistent cough and wheezing since before December, worsening despite treatments including methylprednisolone, cefdinir, and various inhalers. Suspected aspiration due to dysphagia, possibly exacerbated by lung cancer. Wheezing and coarse lung sounds present. She uses a nebulizer, albuterol, and Breztri inhalers. Antibiotics avoided due to lack of improvement and suspicion of non-infectious aspiration rather than infection. - Order chest x-ray to evaluate for pneumonia or other lung pathology - Prescribe albuterol inhaler for acute symptoms - Prescribe maintenance inhaler (Stiolto) and provide a list of alternative inhalers for cost considerations - Refer to pulmonology for further evaluation and management - Instruct to use incentive spirometer to take 10 deep breaths every hour while awake - Advise to avoid drinking thin liquids like water and coffee for a few days to assess impact on cough - Discuss potential aspiration risk and its contribution to cough and pneumonia risk  Dysphagia with Aspiration Risk Difficulty swallowing leading to aspiration and chronic cough. She has a G-tube for nutrition but continues some oral intake. Previously diagnosed with aspiration pneumonia. Advised caution with oral intake to prevent further aspiration. Avoidance of thin liquids recommended due to suspected contribution to aspiration and chronic cough. - Advise to avoid thin liquids and consider using thicker foods and fluids - Discuss the use of Betaine for dry mouth management - Monitor for symptoms of aspiration pneumonia such as fever and chills  Non-small cell lung Cancer Lung cancer with current cessation of treatment due to weakness and illness. Respiratory  symptoms may be related to lung cancer. Treatment paused due to her weakened state and illness. - Plan for a CT scan in April to assess the status of lung cancer; defer to management by oncology  Goals of Care Discussion of quality of life versus quantity of life in the context of lung cancer and aspiration  risk. Advised to weigh pros and cons of continuing oral intake versus aspiration risk and complications. Decision-making involves considering aspiration's impact on quality of life and potential for pneumonia. - Engage in shared decision-making regarding the balance between quality of life and medical interventions    Follow-up Follow-up plans to monitor her condition and response to treatment. - Schedule follow-up appointment in June unless symptoms worsen - Instruct to report any new symptoms such as fever or chills - Follow up with pulmonology after referral  Return in about 3 months (around 10/21/2023), or if symptoms worsen or fail to improve, for chronic f/u.      I discussed the assessment and treatment plan with the patient  The patient was provided an opportunity to ask questions and all were answered. The patient agreed with the plan and demonstrated an understanding of the instructions.   The patient was advised to call back or seek an in-person evaluation if the symptoms worsen or if the condition fails to improve as anticipated.  Total time was 50 minutes. That includes chart review before the visit, the actual patient visit, and time spent on documentation after the visit.    Sherlyn Hay, DO  [facility] [phone] (phone) [phone] (fax)  [facility]

## 2023-07-24 NOTE — Telephone Encounter (Signed)
 Requested medication (s) are due for refill today: yes  Requested medication (s) are on the active medication list: yes  Last refill:  06/05/23  Future visit scheduled: no  Notes to clinic:  Unable to refill per protocol, last refill by another provider. Routing for review.     Requested Prescriptions  Pending Prescriptions Disp Refills   metoprolol succinate (TOPROL-XL) 25 MG 24 hr tablet [Pharmacy Med Name: METOPROLOL SUCCINATE ER 25 MG TAB] 30 tablet     Sig: TAKE 1 TABLET BY MOUTH DAILY     Cardiovascular:  Beta Blockers Passed - 07/24/2023  9:12 AM      Passed - Last BP in normal range    BP Readings from Last 1 Encounters:  07/21/23 (!) 100/58         Passed - Last Heart Rate in normal range    Pulse Readings from Last 1 Encounters:  07/21/23 80         Passed - Valid encounter within last 6 months    Recent Outpatient Visits           4 months ago Spasm of thoracic back muscle   [facility] [place], Monico Blitz, DO   5 months ago Gastroesophageal reflux disease with esophagitis without hemorrhage   [facility] Jacky Kindle, FNP   6 months ago Chronic cough   [facility] Jacky Kindle, FNP   6 months ago Anxiety about health   [facility] Jacky Kindle, FNP   7 months ago Uncontrolled pain   [facility] Jacky Kindle, [place]

## 2023-07-25 DIAGNOSIS — R1310 Dysphagia, unspecified: Secondary | ICD-10-CM | POA: Diagnosis not present

## 2023-07-25 DIAGNOSIS — I700 Atherosclerosis of aorta: Secondary | ICD-10-CM | POA: Diagnosis not present

## 2023-07-25 DIAGNOSIS — I2510 Atherosclerotic heart disease of native coronary artery without angina pectoris: Secondary | ICD-10-CM | POA: Diagnosis not present

## 2023-07-25 DIAGNOSIS — E440 Moderate protein-calorie malnutrition: Secondary | ICD-10-CM | POA: Diagnosis not present

## 2023-07-25 DIAGNOSIS — J449 Chronic obstructive pulmonary disease, unspecified: Secondary | ICD-10-CM | POA: Diagnosis not present

## 2023-07-25 DIAGNOSIS — I4892 Unspecified atrial flutter: Secondary | ICD-10-CM | POA: Diagnosis not present

## 2023-07-25 DIAGNOSIS — I480 Paroxysmal atrial fibrillation: Secondary | ICD-10-CM | POA: Diagnosis not present

## 2023-07-25 DIAGNOSIS — Z431 Encounter for attention to gastrostomy: Secondary | ICD-10-CM | POA: Diagnosis not present

## 2023-07-25 DIAGNOSIS — I5032 Chronic diastolic (congestive) heart failure: Secondary | ICD-10-CM | POA: Diagnosis not present

## 2023-07-26 ENCOUNTER — Inpatient Hospital Stay: Payer: Medicare HMO

## 2023-07-26 ENCOUNTER — Inpatient Hospital Stay: Payer: Medicare HMO | Attending: Oncology

## 2023-07-26 DIAGNOSIS — E538 Deficiency of other specified B group vitamins: Secondary | ICD-10-CM | POA: Insufficient documentation

## 2023-07-26 DIAGNOSIS — Z23 Encounter for immunization: Secondary | ICD-10-CM

## 2023-07-26 MED ORDER — CYANOCOBALAMIN 1000 MCG/ML IJ SOLN
1000.0000 ug | Freq: Once | INTRAMUSCULAR | Status: AC
  Administered 2023-07-26: 1000 ug via INTRAMUSCULAR
  Filled 2023-07-26: qty 1

## 2023-07-26 NOTE — Progress Notes (Signed)
 Nutrition Follow-up:  Patient with non small cell lung cancer.  PEG placed on 05/22/22 during hospital admission because of dysphagia, aspiration pneumonia.  PEG replaced on 2/21 in ED (14 Fr).  Not currently receiving treatment.  CT planned for 4/15.  Met with patient following Vit B 12 injection.  Reports that she has been giving 4 cartons of nutren 1.5 via feeding tube.  Flushes with water before and after, at least a syringe full.  Says that her PCP told her not to eat or drink anything by mouth because she is aspirating.  Says she has to see a Pulmonary doctor.  Reports that she is takes ice chips by mouth and some of her medications.  Reports bowel movement daily, but not diarrhea.  Has some nausea but well controlled.  Has received shipment of formula and tube feeding supplies.      Medications: reviewed  Labs: reviewed  Anthropometrics:   Weight 109 lb today in clinic, increased  106 lb on 2/26 111 lb on 2/21 117 lb on 2/3 119 lb 4.3 oz on 1/28 112 lb on 10.5 oz 1/22   Estimated Energy Needs  Kcals: 1500-1750 Protein: 50-60 g Fluid: 1500-1750 ml  NUTRITION DIAGNOSIS: Inadequate oral intake related to dysphagia as evidenced by PEG tube.    MALNUTRITION DIAGNOSIS: moderate malnutrition improving with weight gain   INTERVENTION:  Continue at least 4 cartons of nutren 1.5 via PEG tube.  Flush with 60ml of water before and after each feeding (QID).  GIve additional 1 1/2 cups of water via feeding tube for additional hydration Tube feeding provides  1500 calories, 68 g protein and 1604 ml free water (includes water flush and free water in formula) [employer] provides enteral nutrition supplies for patient.  Consider referral to SLP. Discussed this with patient and she says she does not think she needs this service.      MONITORING, EVALUATION, GOAL: weight trends, tube feeding formula   NEXT VISIT: phone call April 23rd Wednesday  Wash Nienhaus B. Elease Hashimoto, CSO, LDN Registered  Dietitian [phone]

## 2023-07-28 ENCOUNTER — Encounter: Payer: Self-pay | Admitting: Family Medicine

## 2023-07-28 ENCOUNTER — Other Ambulatory Visit: Payer: Self-pay | Admitting: Family Medicine

## 2023-07-28 DIAGNOSIS — I5032 Chronic diastolic (congestive) heart failure: Secondary | ICD-10-CM | POA: Diagnosis not present

## 2023-07-28 DIAGNOSIS — I4892 Unspecified atrial flutter: Secondary | ICD-10-CM | POA: Diagnosis not present

## 2023-07-28 DIAGNOSIS — F418 Other specified anxiety disorders: Secondary | ICD-10-CM

## 2023-07-28 DIAGNOSIS — I2510 Atherosclerotic heart disease of native coronary artery without angina pectoris: Secondary | ICD-10-CM | POA: Diagnosis not present

## 2023-07-28 DIAGNOSIS — I480 Paroxysmal atrial fibrillation: Secondary | ICD-10-CM | POA: Diagnosis not present

## 2023-07-28 DIAGNOSIS — J449 Chronic obstructive pulmonary disease, unspecified: Secondary | ICD-10-CM | POA: Diagnosis not present

## 2023-07-28 DIAGNOSIS — R1310 Dysphagia, unspecified: Secondary | ICD-10-CM | POA: Diagnosis not present

## 2023-07-28 DIAGNOSIS — E440 Moderate protein-calorie malnutrition: Secondary | ICD-10-CM | POA: Diagnosis not present

## 2023-07-28 DIAGNOSIS — Z431 Encounter for attention to gastrostomy: Secondary | ICD-10-CM | POA: Diagnosis not present

## 2023-07-28 DIAGNOSIS — I700 Atherosclerosis of aorta: Secondary | ICD-10-CM | POA: Diagnosis not present

## 2023-07-31 NOTE — Telephone Encounter (Signed)
 Requested Prescriptions  Pending Prescriptions Disp Refills   busPIRone (BUSPAR) 5 MG tablet [Pharmacy Med Name: BUSPIRONE HCL 5 MG TAB] 270 tablet 0    Sig: TAKE 1 TABLET BY MOUTH 3 TIMES DAILY AS NEEDED     Psychiatry: Anxiolytics/Hypnotics - Non-controlled Passed - 07/31/2023  2:10 PM      Passed - Valid encounter within last 12 months    Recent Outpatient Visits           1 week ago Symptoms of upper respiratory infection (URI)   [place] [facility] [place], Monico Blitz, DO   3 weeks ago Mucopurulent chronic bronchitis [facility])   [place] [facility] [place], Monico Blitz, DO   1 month ago Protein-calorie malnutrition, moderate (HCC)   [facility] Ronnald Ramp, MD

## 2023-08-01 DIAGNOSIS — E440 Moderate protein-calorie malnutrition: Secondary | ICD-10-CM | POA: Diagnosis not present

## 2023-08-01 DIAGNOSIS — I4892 Unspecified atrial flutter: Secondary | ICD-10-CM | POA: Diagnosis not present

## 2023-08-01 DIAGNOSIS — Z431 Encounter for attention to gastrostomy: Secondary | ICD-10-CM | POA: Diagnosis not present

## 2023-08-01 DIAGNOSIS — I480 Paroxysmal atrial fibrillation: Secondary | ICD-10-CM | POA: Diagnosis not present

## 2023-08-01 DIAGNOSIS — R1310 Dysphagia, unspecified: Secondary | ICD-10-CM | POA: Diagnosis not present

## 2023-08-01 DIAGNOSIS — I700 Atherosclerosis of aorta: Secondary | ICD-10-CM | POA: Diagnosis not present

## 2023-08-01 DIAGNOSIS — I2510 Atherosclerotic heart disease of native coronary artery without angina pectoris: Secondary | ICD-10-CM | POA: Diagnosis not present

## 2023-08-01 DIAGNOSIS — I5032 Chronic diastolic (congestive) heart failure: Secondary | ICD-10-CM | POA: Diagnosis not present

## 2023-08-01 DIAGNOSIS — J449 Chronic obstructive pulmonary disease, unspecified: Secondary | ICD-10-CM | POA: Diagnosis not present

## 2023-08-02 DIAGNOSIS — R1310 Dysphagia, unspecified: Secondary | ICD-10-CM | POA: Diagnosis not present

## 2023-08-02 DIAGNOSIS — E440 Moderate protein-calorie malnutrition: Secondary | ICD-10-CM | POA: Diagnosis not present

## 2023-08-02 DIAGNOSIS — J449 Chronic obstructive pulmonary disease, unspecified: Secondary | ICD-10-CM | POA: Diagnosis not present

## 2023-08-02 DIAGNOSIS — I4892 Unspecified atrial flutter: Secondary | ICD-10-CM | POA: Diagnosis not present

## 2023-08-02 DIAGNOSIS — I480 Paroxysmal atrial fibrillation: Secondary | ICD-10-CM | POA: Diagnosis not present

## 2023-08-02 DIAGNOSIS — I2510 Atherosclerotic heart disease of native coronary artery without angina pectoris: Secondary | ICD-10-CM | POA: Diagnosis not present

## 2023-08-02 DIAGNOSIS — Z431 Encounter for attention to gastrostomy: Secondary | ICD-10-CM | POA: Diagnosis not present

## 2023-08-02 DIAGNOSIS — I700 Atherosclerosis of aorta: Secondary | ICD-10-CM | POA: Diagnosis not present

## 2023-08-02 DIAGNOSIS — I5032 Chronic diastolic (congestive) heart failure: Secondary | ICD-10-CM | POA: Diagnosis not present

## 2023-08-03 ENCOUNTER — Other Ambulatory Visit: Payer: Self-pay | Admitting: Oncology

## 2023-08-03 ENCOUNTER — Other Ambulatory Visit: Payer: Self-pay | Admitting: Family Medicine

## 2023-08-04 ENCOUNTER — Encounter: Payer: Self-pay | Admitting: Oncology

## 2023-08-04 NOTE — Telephone Encounter (Signed)
 Requested Prescriptions  Pending Prescriptions Disp Refills   sucralfate (CARAFATE) 1 GM/10ML suspension [Pharmacy Med Name: SUCRALFATE 1 GM/10ML SUSP ML] 420 mL 1    Sig: TAKE 10 MLS (1G TOTAL) BY MOUTH FOUR TIMES A DAY     Gastroenterology: Antiacids Passed - 08/04/2023 11:25 AM      Passed - Valid encounter within last 12 months    Recent Outpatient Visits           2 weeks ago Symptoms of upper respiratory infection (URI)   [place] [facility] [place], Monico Blitz, DO   1 month ago Mucopurulent chronic bronchitis [facility])   [place] [facility] [place], Monico Blitz, DO   1 month ago Protein-calorie malnutrition, moderate (HCC)   [place] [facility] [place], Tawanna Cooler, MD

## 2023-08-11 ENCOUNTER — Ambulatory Visit: Payer: Self-pay

## 2023-08-11 ENCOUNTER — Other Ambulatory Visit: Payer: Self-pay | Admitting: Hospice and Palliative Medicine

## 2023-08-11 ENCOUNTER — Other Ambulatory Visit: Payer: Self-pay | Admitting: Family Medicine

## 2023-08-11 ENCOUNTER — Other Ambulatory Visit: Payer: Self-pay | Admitting: Internal Medicine

## 2023-08-11 ENCOUNTER — Telehealth: Payer: Self-pay | Admitting: Internal Medicine

## 2023-08-11 DIAGNOSIS — R52 Pain, unspecified: Secondary | ICD-10-CM

## 2023-08-11 MED ORDER — OXYCODONE HCL 5 MG/5ML PO SOLN: 7.5000 mg | ORAL | 0 refills | Status: DC | PRN

## 2023-08-11 NOTE — Telephone Encounter (Signed)
 Requested medications are due for refill today.  yes  Requested medications are on the active medications list.  yes  Last refill. 06/05/2023 #120 0 rf  Future visit scheduled.   no  Notes to clinic.  Refill not delegated.    Requested Prescriptions  Pending Prescriptions Disp Refills   oxyCODONE HCl 7.5 MG TABS 120 tablet 0    Sig: Place 7.5 mg into feeding tube every 4 (four) hours as needed.     Not Delegated - Analgesics:  Opioid Agonists Failed - 08/11/2023  5:25 PM      Failed - This refill cannot be delegated      Failed - Urine Drug Screen completed in last 360 days      Passed - Valid encounter within last 3 months    Recent Outpatient Visits           3 weeks ago Chronic cough   [place] [facility] [place], Monico Blitz, DO   1 month ago Mucopurulent chronic bronchitis [facility])   [place] [facility] [place], Monico Blitz, DO   1 month ago Protein-calorie malnutrition, moderate (HCC)   [place] [facility] Ronnald Ramp, MD

## 2023-08-11 NOTE — Telephone Encounter (Signed)
 Copied from CRM [id number]. Topic: Clinical - Medication Refill >> Aug 11, 2023 11:05 AM Fuller Mandril wrote: Most Recent Primary Care Visit:  Provider: Sherlyn Hay  Department: [facility]  Visit Type: OFFICE VISIT  Date: 07/21/2023  Medication: oxyCODONE 7.5 MG TABS - patient was last prescribed this at the hospital and they only have the liquid she would like to be prescribed the tablets   Has the patient contacted their pharmacy? Yes (Agent: If no, request that the patient contact the pharmacy for the refill. If patient does not wish to contact the pharmacy document the reason why and proceed with request.) (Agent: If yes, when and what did the pharmacy advise?) only has Rx for liquid from hospital   Is this the correct pharmacy for this prescription? Yes If no, delete pharmacy and type the correct one.  This is the patient's preferred pharmacy:  [facility] - [place], [place] - [address] [place] [address] Phone: [phone] Fax: [phone]  Has the prescription been filled recently? Yes  Is the patient out of the medication? Yes  Has the patient been seen for an appointment in the last year OR does the patient have an upcoming appointment? Yes  Can we respond through MyChart? Yes  Agent: Please be advised that Rx refills may take up to 3 business days. We ask that you follow-up with your pharmacy.

## 2023-08-11 NOTE — Telephone Encounter (Signed)
  Chief Complaint: medication refill Symptoms: out of meds Frequency: today Pertinent Negatives: Patient denies NA Disposition: [] ED /[] Urgent Care (no appt availability in office) / [] Appointment(In office/virtual)/ []  [place] [facility]/ [] Home Care/ [] Refused Recommended Disposition /[] [place] [facility]/ [x]  Follow-up with PCP Additional Notes: Patient calling to check status of oxycodone refill. Advised it can take up to 3 days for refill. Patient is upset she is out of medication, called 3 times today and it was still not sent. Advised for weekend if pain is intolerable she will need to seek evaluation at urgent care if open or ED. Patient verbalized understanding but still upset.     Copied from CRM [id number]. Topic: Clinical - Red Word Triage >> Aug 11, 2023  5:55 PM Shelah Lewandowsky wrote: Red Word that prompted transfer to Nurse Triage: Pain meds have not been sent to pharmacy yet and patient is in a lot of pain - pain level 6 Reason for Disposition . Caller requesting a CONTROLLED substance prescription refill (e.g., narcotics, ADHD medicines)  Protocols used: Medication Refill and Renewal Call-A-AH

## 2023-08-11 NOTE — Telephone Encounter (Signed)
 Patient called requesting for oxycodone 7.5 mg q4h prn.  She ran out of it today and wants the prescription right now.  Pharmacy called and they only have liquid formulation.  Sent script for 5 days.   Route to primary team for extended script.

## 2023-08-11 NOTE — Telephone Encounter (Unsigned)
 Copied from CRM [id number]. Topic: Clinical - Medication Refill >> Aug 11, 2023 11:05 AM Fuller Mandril wrote: Most Recent Primary Care Visit:  Provider: Sherlyn Hay  Department: [facility]  Visit Type: OFFICE VISIT  Date: 07/21/2023  Medication: oxyCODONE 7.5 MG TABS - patient was last prescribed this at the hospital and they only have the liquid she would like to be prescribed the tablets   Has the patient contacted their pharmacy? Yes (Agent: If no, request that the patient contact the pharmacy for the refill. If patient does not wish to contact the pharmacy document the reason why and proceed with request.) (Agent: If yes, when and what did the pharmacy advise?) only has Rx for liquid from hospital   Is this the correct pharmacy for this prescription? Yes If no, delete pharmacy and type the correct one.  This is the patient's preferred pharmacy:  [facility] - [place], [place] - [address] [place] [address] Phone: [phone] Fax: [phone]  Has the prescription been filled recently? Yes  Is the patient out of the medication? Yes  Has the patient been seen for an appointment in the last year OR does the patient have an upcoming appointment? Yes  Can we respond through MyChart? Yes  Agent: Please be advised that Rx refills may take up to 3 business days. We ask that you follow-up with your pharmacy.

## 2023-08-12 ENCOUNTER — Encounter: Payer: Self-pay | Admitting: Oncology

## 2023-08-14 NOTE — Telephone Encounter (Signed)
 Filled by Dr. Randy Buttery 08/12/2023. Oxycodone not normally filled by [facility].

## 2023-08-15 ENCOUNTER — Other Ambulatory Visit: Payer: Self-pay | Admitting: Family Medicine

## 2023-08-15 ENCOUNTER — Ambulatory Visit
Admission: RE | Admit: 2023-08-15 | Discharge: 2023-08-15 | Disposition: A | Discharge status: Home / Self Care | Payer: Medicare HMO | Source: Ambulatory Visit | Attending: Oncology | Admitting: Oncology

## 2023-08-15 DIAGNOSIS — C3492 Malignant neoplasm of unspecified part of left bronchus or lung: Secondary | ICD-10-CM | POA: Diagnosis present

## 2023-08-15 DIAGNOSIS — E538 Deficiency of other specified B group vitamins: Secondary | ICD-10-CM | POA: Insufficient documentation

## 2023-08-15 MED ORDER — IOHEXOL 300 MG/ML  SOLN
100.0000 mL | Freq: Once | INTRAMUSCULAR | Status: AC | PRN
  Administered 2023-08-15: 80 mL via INTRAVENOUS

## 2023-08-16 ENCOUNTER — Telehealth: Payer: Self-pay

## 2023-08-16 ENCOUNTER — Other Ambulatory Visit: Payer: Self-pay | Admitting: Family Medicine

## 2023-08-16 DIAGNOSIS — J449 Chronic obstructive pulmonary disease, unspecified: Secondary | ICD-10-CM | POA: Diagnosis not present

## 2023-08-16 DIAGNOSIS — R1310 Dysphagia, unspecified: Secondary | ICD-10-CM | POA: Diagnosis not present

## 2023-08-16 DIAGNOSIS — I2510 Atherosclerotic heart disease of native coronary artery without angina pectoris: Secondary | ICD-10-CM | POA: Diagnosis not present

## 2023-08-16 DIAGNOSIS — Z431 Encounter for attention to gastrostomy: Secondary | ICD-10-CM | POA: Diagnosis not present

## 2023-08-16 DIAGNOSIS — I480 Paroxysmal atrial fibrillation: Secondary | ICD-10-CM | POA: Diagnosis not present

## 2023-08-16 DIAGNOSIS — I700 Atherosclerosis of aorta: Secondary | ICD-10-CM | POA: Diagnosis not present

## 2023-08-16 DIAGNOSIS — I5032 Chronic diastolic (congestive) heart failure: Secondary | ICD-10-CM | POA: Diagnosis not present

## 2023-08-16 DIAGNOSIS — I4892 Unspecified atrial flutter: Secondary | ICD-10-CM | POA: Diagnosis not present

## 2023-08-16 DIAGNOSIS — E440 Moderate protein-calorie malnutrition: Secondary | ICD-10-CM | POA: Diagnosis not present

## 2023-08-16 MED ORDER — OXYCODONE HCL 7.5 MG PO TABS: 7.5000 mg | ORAL_TABLET | ORAL | 0 refills | Status: DC | PRN

## 2023-08-16 NOTE — Telephone Encounter (Signed)
 Copied from CRM [id number]. Topic: Clinical - Prescription Issue >> Aug 16, 2023  3:40 PM Lotus Round B wrote: Reason for CRM: pt called in stated that she only has 2 left of her oxyCODONE (ROXICODONE) 5 MG/5ML solution and was wondering if Dr.Pardue can make a prescription for me if possible . Pt wondering if she can get a call from nurse or Dr. To let her know about the prescription as well .

## 2023-08-16 NOTE — Telephone Encounter (Signed)
 Lidocaine was ordered by cancer center.

## 2023-08-16 NOTE — Addendum Note (Signed)
 Addended byJudyann Number on: 08/16/2023 05:10 PM   Modules accepted: Orders

## 2023-08-16 NOTE — Telephone Encounter (Signed)
 Requested Prescriptions  Pending Prescriptions Disp Refills   lactulose, encephalopathy, (ENULOSE) 10 GM/15ML SOLN [Pharmacy Med Name: ENULOSE 10 GM/15ML SOLN ML] 946 mL 0    Sig: PLACE 30 MLS (20G TOTAL) INTO FEEDING TUBE TWICE A DAY     Gastroenterology:  Laxatives - lactulose Passed - 08/16/2023  2:10 PM      Passed - Cl in normal range and within 360 days    Chloride  Date Value Ref Range Status  06/28/2023 101 98 - 111 mmol/L Final  04/08/2014 113 (H) 98 - 107 mmol/L Final         Passed - CO2 in normal range and within 360 days    CO2  Date Value Ref Range Status  06/28/2023 26 22 - 32 mmol/L Final   Co2  Date Value Ref Range Status  04/08/2014 26 21 - 32 mmol/L Final   Bicarbonate  Date Value Ref Range Status  01/25/2023 27.8 20.0 - 28.0 mmol/L Final         Passed - K in normal range and within 360 days    Potassium  Date Value Ref Range Status  06/28/2023 4.9 3.5 - 5.1 mmol/L Final  04/08/2014 3.5 3.5 - 5.1 mmol/L Final         Passed - Na in normal range and within 360 days    Sodium  Date Value Ref Range Status  06/28/2023 136 135 - 145 mmol/L Final  11/03/2021 145 (H) 134 - 144 mmol/L Final  04/08/2014 143 136 - 145 mmol/L Final         Passed - Valid encounter within last 12 months    Recent Outpatient Visits           3 weeks ago Chronic cough   [place] [facility] [place], Asencion Blacksmith, DO   1 month ago Mucopurulent chronic bronchitis [facility])   [place] [facility], Asencion Blacksmith, DO   1 month ago Protein-calorie malnutrition, moderate (HCC)   [place] [facility] [place], Camas, MD              Refused Prescriptions Disp Refills   lidocaine (LIDODERM) 5 % [Pharmacy Med Name: LIDOCAINE 5% TOP PAT]      Sig: PLACE 1 PATCH ONTO PAINFUL AREA OF SKIN DAILY. REMOVE AND DISCARD PATCH WITHIN 12 HOURS OR AS DIRECTED BY MD. [id number] HOURS ON, 12 HOURS OFF.     Analgesics:  Topicals  Failed - 08/16/2023  2:10 PM      Failed - Manual Review: Labs are only required if the patient has taken medication for more than 8 weeks.      Passed - PLT in normal range and within 360 days    Platelets  Date Value Ref Range Status  11/03/2021 216 150 - 450 x10E3/uL Final   Platelet Count  Date Value Ref Range Status  06/28/2023 208 150 - 400 K/uL Final         Passed - HGB in normal range and within 360 days    Hemoglobin  Date Value Ref Range Status  06/28/2023 12.9 12.0 - 15.0 g/dL Final  08/65/7846 96.2 11.1 - 15.9 g/dL Final         Passed - HCT in normal range and within 360 days    HCT  Date Value Ref Range Status  06/28/2023 40.1 36.0 - 46.0 % Final   Hematocrit  Date Value Ref Range Status  11/03/2021 42.1 34.0 - 46.6 %  Final         Passed - Cr in normal range and within 360 days    Creatinine  Date Value Ref Range Status  06/28/2023 0.80 0.44 - 1.00 mg/dL Final  28/41/3244 0.10 0.60 - 1.30 mg/dL Final         Passed - eGFR is 30 or above and within 360 days    EGFR (African American)  Date Value Ref Range Status  04/08/2014 >60 >40mL/min Final  11/26/2012 >60  Final   GFR calc Af Amer  Date Value Ref Range Status  01/20/2020 >60 >60 mL/min Final   EGFR (Non-African Amer.)  Date Value Ref Range Status  04/08/2014 >60 >56mL/min Final    Comment:    eGFR values <12mL/min/1.73 m2 may be an indication of chronic kidney disease (CKD). Calculated eGFR, using the MRDR Study equation, is useful in  patients with stable renal function. The eGFR calculation will not be reliable in acutely ill patients when serum creatinine is changing rapidly. It is not useful in patients on dialysis. The eGFR calculation may not be applicable to patients at the low and high extremes of body sizes, pregnant women, and vegetarians.   11/26/2012 >60  Final    Comment:    eGFR values <72mL/min/1.73 m2 may be an indication of chronic kidney disease (CKD). Calculated  eGFR is useful in patients with stable renal function. The eGFR calculation will not be reliable in acutely ill patients when serum creatinine is changing rapidly. It is not useful in  patients on dialysis. The eGFR calculation may not be applicable to patients at the low and high extremes of body sizes, pregnant women, and vegetarians.    GFR, Estimated  Date Value Ref Range Status  06/28/2023 >60 >60 mL/min Final    Comment:    (NOTE) Calculated using the CKD-EPI Creatinine Equation (2021)    eGFR  Date Value Ref Range Status  11/03/2021 84 >59 mL/min/1.73 Final         Passed - Patient is not pregnant      Passed - Valid encounter within last 12 months    Recent Outpatient Visits           3 weeks ago Chronic cough   [place] [facility] [place], Asencion Blacksmith, DO   1 month ago Mucopurulent chronic bronchitis [facility])   [place] [facility] P[facility], Asencion Blacksmith, DO   1 month ago Protein-calorie malnutrition, moderate (HCC)   [place] [facility] [place], Judyann Number, MD

## 2023-08-17 ENCOUNTER — Telehealth: Payer: Self-pay

## 2023-08-17 DIAGNOSIS — R52 Pain, unspecified: Secondary | ICD-10-CM

## 2023-08-17 DIAGNOSIS — E440 Moderate protein-calorie malnutrition: Secondary | ICD-10-CM | POA: Diagnosis not present

## 2023-08-17 DIAGNOSIS — Z431 Encounter for attention to gastrostomy: Secondary | ICD-10-CM | POA: Diagnosis not present

## 2023-08-17 DIAGNOSIS — I4892 Unspecified atrial flutter: Secondary | ICD-10-CM | POA: Diagnosis not present

## 2023-08-17 DIAGNOSIS — I700 Atherosclerosis of aorta: Secondary | ICD-10-CM | POA: Diagnosis not present

## 2023-08-17 DIAGNOSIS — I5032 Chronic diastolic (congestive) heart failure: Secondary | ICD-10-CM | POA: Diagnosis not present

## 2023-08-17 DIAGNOSIS — R1310 Dysphagia, unspecified: Secondary | ICD-10-CM | POA: Diagnosis not present

## 2023-08-17 DIAGNOSIS — J449 Chronic obstructive pulmonary disease, unspecified: Secondary | ICD-10-CM | POA: Diagnosis not present

## 2023-08-17 DIAGNOSIS — I2510 Atherosclerotic heart disease of native coronary artery without angina pectoris: Secondary | ICD-10-CM | POA: Diagnosis not present

## 2023-08-17 DIAGNOSIS — I480 Paroxysmal atrial fibrillation: Secondary | ICD-10-CM | POA: Diagnosis not present

## 2023-08-17 MED ORDER — OXYCODONE HCL 5 MG PO TABS: 5.0000 mg | ORAL_TABLET | ORAL | 0 refills | Status: DC | PRN

## 2023-08-17 NOTE — Addendum Note (Signed)
 Addended by: Judyann Number on: 08/17/2023 04:47 PM   Modules accepted: Orders

## 2023-08-17 NOTE — Telephone Encounter (Signed)
 Copied from CRM [id number]. Topic: Clinical - Prescription Issue >> Aug 17, 2023  1:18 PM Lotus Round B wrote: Reason for CRM: Dawn from [employer] called in because they received a prescription for oxyCODONE HCl 7.5 MG TABS For the patient .  She said that her pharmacy does not carry 7.5mg  tabs they only carry 5mg  tabs so if Dr.Pardue would like to convert the prescription for the oxyCODONE HCl 7.5 MG TABS and turn it into 5mg  instead would be better . She also stated that she doesn't know if Dr.Pardue is aware but the patient has been getting the oxyCODONE soultion from the cancer center which she picked up some yesterday from the pharmacy . So if Dr.Pardue would like the patient to get the oxy soultion instead, then she has a bunch in stock at the pharmacy . If the nurse or Dr.pardue has any questions or want to clarify anything to call the pharmacy [phone].

## 2023-08-23 ENCOUNTER — Inpatient Hospital Stay: Attending: Oncology

## 2023-08-23 DIAGNOSIS — E538 Deficiency of other specified B group vitamins: Secondary | ICD-10-CM | POA: Insufficient documentation

## 2023-08-23 NOTE — Progress Notes (Signed)
 Nutrition  Patient scheduled for phone nutrition follow-up visit.  Called patient and emergency contact and got the same message that call can not be completed.    RD will plan to see on 4/25 when patient in clinic for injection.   Hajer Dwyer B. Zollie Hipp, CSO, LDN Registered Dietitian [phone]

## 2023-08-25 ENCOUNTER — Inpatient Hospital Stay: Payer: Medicare HMO

## 2023-08-25 ENCOUNTER — Telehealth: Payer: Self-pay | Admitting: Family Medicine

## 2023-08-25 ENCOUNTER — Other Ambulatory Visit: Payer: Self-pay

## 2023-08-25 ENCOUNTER — Inpatient Hospital Stay

## 2023-08-25 DIAGNOSIS — R52 Pain, unspecified: Secondary | ICD-10-CM

## 2023-08-25 DIAGNOSIS — E538 Deficiency of other specified B group vitamins: Secondary | ICD-10-CM | POA: Diagnosis present

## 2023-08-25 DIAGNOSIS — Z23 Encounter for immunization: Secondary | ICD-10-CM

## 2023-08-25 DIAGNOSIS — F418 Other specified anxiety disorders: Secondary | ICD-10-CM

## 2023-08-25 MED ORDER — CYANOCOBALAMIN 1000 MCG/ML IJ SOLN
1000.0000 ug | Freq: Once | INTRAMUSCULAR | Status: AC
  Administered 2023-08-25: 1000 ug via INTRAMUSCULAR
  Filled 2023-08-25: qty 1

## 2023-08-25 MED ORDER — LIDOCAINE 5 % EX PTCH: 1.0000 | MEDICATED_PATCH | CUTANEOUS | 2 refills | Status: AC

## 2023-08-25 MED ORDER — OXYCODONE HCL 5 MG/5ML PO SOLN
5.0000 mg | Freq: Four times a day (QID) | ORAL | 0 refills | Status: DC | PRN
Start: 2023-08-25 — End: 2023-09-08

## 2023-08-25 NOTE — Progress Notes (Signed)
 Nutrition Follow-up:  Patient with non small cell lung cancer.  PEG placed on 05/22/22 during hospital admission because of dysphagia, aspiration pneumonia.  PEG replaced on 2/21 in ED  (14 Fr).  Not currently undergoing treatment  Met with patient and husband following Vitamin B 12 injection.  Patient reports that she has been giving 3 cartons of nutren 1.5 via feeding tube.  Flushing with 60ml of water  before and after feeding. Also gives with water  with medication administration via tube.  Has also been eating pureed foods by mouth, no more than 10 bites per patient.  Pureeing mashed potatoes, baked beans and hot dogs.  Drinking water  and gatorade orally, 3, 20 oz water  bottles full per patient.  Denies problems with tube feeding at this time. Says she hasn't been getting in 4 a day because she sleeps to late.      Medications: reviewed  Labs: no new  Anthropometrics:   Weight 107 lb 2 oz today 109 lb on 3/26 111 lb on 2/21 117 lb on 2/3 119 lb 4/3 oz on 05/30/23  Estimated Energy Needs  Kcals: 1500-1750 Protein: 50-60 g Fluid: 1500-1750 ml  NUTRITION DIAGNOSIS: Inadequate oral intake continues, relying on feeding tube   MALNUTRITION DIAGNOSIS: moderate malnutrition continues   INTERVENTION:  Recommend patient increase tube feeding to goal rate of nutren 1.5, 4 bottles per day. Flush with 60ml of water  before and after feeding. Patient drinking fluids orally for additional hydration.  Oral intake per MD.  Patient declined SLP evaluation again today.  She will let RD know if she wants to be seen by SLP Tube feeding provides 1500 calories, 68 g protein and 1240 ml (free water  in formula and flush) Patient reports that she is about out of tube feeding formula.  Provided number to [employer] to reorder.  Says that her and her husband's phone has been turned off because she does not have $500 to pay the phone company right now.  RD called [employer] and re-ordered tube feeding supplies.   Message sent to [facility] regarding patient's debt.    MONITORING, EVALUATION, GOAL: Weight trends, intake, tube feeding   NEXT VISIT: to be determined  Shanena Pellegrino B. Zollie Hipp, CSO, LDN Registered Dietitian [phone]

## 2023-08-25 NOTE — Telephone Encounter (Signed)
 [facility] [employer] is requesting refill DULOXENTINE HCL 60 MG CAP Not on current med list Please advise

## 2023-08-28 NOTE — Progress Notes (Unsigned)
 [facility] CSW Progress Note  Visual merchandiser received message from [place], [place], that patient's phone has been disconnected due to her inability to pay.  CSW mailed her information regarding government plans for free phones along with contact information.    Kennth Peal, LCSW Clinical Social Worker [facility]

## 2023-08-30 ENCOUNTER — Ambulatory Visit: Admitting: Internal Medicine

## 2023-08-30 ENCOUNTER — Encounter: Payer: Self-pay | Admitting: Internal Medicine

## 2023-08-30 VITALS — BP 90/60 | HR 67 | Temp 97.6°F | Ht 61.0 in | Wt 106.0 lb

## 2023-08-30 DIAGNOSIS — I2692 Saddle embolus of pulmonary artery without acute cor pulmonale: Secondary | ICD-10-CM | POA: Diagnosis not present

## 2023-08-30 DIAGNOSIS — J441 Chronic obstructive pulmonary disease with (acute) exacerbation: Secondary | ICD-10-CM

## 2023-08-30 DIAGNOSIS — C3490 Malignant neoplasm of unspecified part of unspecified bronchus or lung: Secondary | ICD-10-CM | POA: Diagnosis not present

## 2023-08-30 MED ORDER — AZITHROMYCIN 250 MG PO TABS: ORAL_TABLET | ORAL | 0 refills | Status: DC

## 2023-08-30 MED ORDER — IPRATROPIUM-ALBUTEROL 0.5-2.5 (3) MG/3ML IN SOLN
3.0000 mL | Freq: Once | RESPIRATORY_TRACT | Status: AC
  Administered 2023-08-30: 3 mL via RESPIRATORY_TRACT

## 2023-08-30 MED ORDER — TRELEGY ELLIPTA 100-62.5-25 MCG/ACT IN AEPB: 1.0000 | INHALATION_SPRAY | Freq: Every day | RESPIRATORY_TRACT | 5 refills | Status: AC

## 2023-08-30 MED ORDER — APIXABAN 5 MG PO TABS: 5.0000 mg | ORAL_TABLET | Freq: Two times a day (BID) | ORAL | 3 refills | Status: DC

## 2023-08-30 MED ORDER — PREDNISONE 20 MG PO TABS: 20.0000 mg | ORAL_TABLET | Freq: Every day | ORAL | 1 refills | Status: DC

## 2023-08-30 NOTE — Progress Notes (Signed)
 [facility]  CC Follow up assessment for COPD Follow up assessment of lung nodules   Past Medical History:  Anxiety, OA, Breast cancer, Vocal cord cancer, Depression, GERD, Hiatal hernia, Parkinson disease, Restless leg syndrome  Past Surgical History:  She  has a past surgical history that includes Skin cancer excision; Knee surgery (Right); Throat surgery (1998); Esophagus surgery; Tubal ligation; Robotic adrenalectomy (Right, 12/28/2016); Ventral hernia repair (N/A, 03/03/2017); Colonoscopy (2015); Breast lumpectomy with sentinel lymph node bx (Right, 12/08/2017); Portacath placement (Right, 01/17/2018); Breast lumpectomy (Right, 1997); Port-a-cath removal; Esophagogastroduodenoscopy (egd) with propofol  (N/A, 05/24/2019); Breast reduction with mastopexy (Left, 06/10/2019); Liposuction with lipofilling (Right, 06/10/2019); Colonoscopy with propofol  (N/A, 10/29/2019); Breast biopsy (Right, 1997); Breast biopsy (Right, 11/15/2017); Breast biopsy (Right, 07/20/2020); Cataract extraction w/PHACO (Right, 02/07/2022); Cataract extraction w/PHACO (Left, 02/28/2022); Esophagogastroduodenoscopy (egd) with propofol  (N/A, 09/06/2022); Colonoscopy (N/A, 01/05/2023); Esophagogastroduodenoscopy (egd) with propofol  (01/05/2023); biopsy (01/05/2023); polypectomy (01/05/2023); Video bronchoscopy with endobronchial ultrasound (N/A, 01/25/2023); IR IMAGING GUIDED PORT INSERTION (02/07/2023); Video bronchoscopy with endobronchial ultrasound (N/A, 02/17/2023); PULMONARY THROMBECTOMY (Bilateral, 05/12/2023); and Gastrostomy (N/A, 05/25/2023).  Brief Summary:  ALANNI HOSELTON is a 68 y.o. female former smoker with chronic cough.      Subjective:  Assessment of COPD Increased wheezing increased shortness of breath Subjective fevers Patient with acute bronchitis and COPD exacerbation Will prescribe prednisone  and antibiotics  Follow-up CT chest in April 2025 Official radiology report  pending CT of the chest reviewed in detail with patient I recommend PET scan to assess for recurrence of cancer  Patient using Spiriva only Moderate COPD based on previous pulmonary function testing I will add Trelegy to her inhaler regimen and tell her to stop using Spiriva   Physical Exam:   BP 90/60 (BP Location: Right Arm, Patient Position: Sitting, Cuff Size: Small)   Pulse 67   Temp 97.6 F (36.4 C) (Oral)   Ht 5\' 1"  (1.549 m)   Wt 106 lb (48.1 kg)   SpO2 97%   BMI 20.03 kg/m     Review of Systems: Gen:  Denies  fever, sweats, chills weight loss  HEENT: Denies blurred vision, double vision, ear pain, eye pain, hearing loss, nose bleeds, sore throat Cardiac:  No dizziness, chest pain or heaviness, chest tightness,edema, No JVD Resp:  + cough, -sputum production, +shortness of breath,+wheezing, -hemoptysis,  Other:  All other systems negative   Physical Examination:   General Appearance: No distress  Very weak, thin and frail EYES PERRLA, EOM intact.   NECK Supple, No JVD Pulmonary: normal breath sounds +wheezing CardiovascularNormal S1,S2.  No m/r/g.   Abdomen: Benign, Soft, non-tender. Neurology UE/LE 5/5 strength, no focal deficits Ext pulses intact, cap refill intact ALL OTHER ROS ARE NEGATIVE     Pulmonary testing:    Chest Imaging:  CT chest 08/14/19 >> centrilobular emphysema, scarring RUL, 3 mm nodule RML, 3 mm nodule RLL, mild GGO 1.2 x 0.9 cm RLL, 4 mm nodule LUL, 1.1 x 1.2 GGO LUL, 1.2 x 1 cm nodule LLL CT chest 08/21/20 >> moderate centrilobular emphysema, stable nodules except new LLL 8 mm GGO CT chest JAN 2025-+PE, Segmental and subsegmental pulmonary emboli in the right lower  Positive for acute PE  Similar appearance of multiple pulmonary nodules Pulmonary function test 2024 March FEV1 FVC ratio 60% predicted FEV1 62% predicted Moderate obstructive lung disease  Social History:  She  reports that she quit smoking about 28 years ago. Her  smoking use included cigarettes. She  started smoking about 58 years ago. She has a 45 pack-year smoking history. She has been exposed to tobacco smoke. She has never used smokeless tobacco. She reports that she does not currently use alcohol after a past usage of about 4.0 standard drinks of alcohol per week. She reports that she does not use drugs.  Family History:  Her family history includes Cancer in her paternal aunt, paternal grandmother, and sister; Cancer (age of onset: 78) in her sister; Diabetes in her mother; Heart attack in her mother.     Assessment/Plan:   68 y.o. female with history of head and neck, breast cancer as well as adrenal carcinoma of unknown primary in the past now with non-small cell carcinoma of unknown primary predominantly with mediastinal adenopathy. She has  being treated as possible non-small cell lung cancer. She is s/p concurrent chemoradiation and here for routine follow-up , now with acute COPD exacerbation likely related to acute bronchitis also with abnormal CT scan will need PET scan for further evaluation  Assessment of COPD Acute COPD exacerbation likely related to acute bronchitis Prednisone  20 mg daily for 7 days Start Z-Pak Start Trelegy inhaler 200 Rinse mouth after every use Stop Spiriva Use albuterol  and nebulizers as needed Will give in office DuoNebs at this time Avoid Allergens and Irritants Avoid secondhand smoke Avoid SICK contacts Recommend  Masking  when appropriate Recommend Keep up-to-date with vaccinations  Lung nodules extensive oncological history CT chest August 15, 2023 reviewed in detail Recommend PET scan Follow-up with oncology on May 5   History of breast cancer and vocal cord cancer. - followed by Dr. Seretha Dance with [facility] in [place]  Acute PE diagnosed January 2025 Patient was unaware of her diagnosis Continue anticoagulation as prescribed Patient asked for refill which I have  provided  Medication List:   Allergies as of 08/30/2023       Reactions   Sulfa  Antibiotics Other (See Comments)   Mouth sore and raw        Medication List        Accurate as of August 30, 2023 12:50 PM. If you have any questions, ask your nurse or doctor.          albuterol  108 (90 Base) MCG/ACT inhaler Commonly known as: VENTOLIN  HFA Inhale 2 puffs into the lungs every 6 (six) hours as needed for wheezing or shortness of breath.   apixaban  5 MG Tabs tablet Commonly known as: Eliquis  Take 1 tablet (5 mg total) by mouth 2 (two) times daily.   benzonatate  100 MG capsule Commonly known as: TESSALON  Take 2 capsules (200 mg total) by mouth 3 (three) times daily as needed for cough.   bisacodyl  10 MG suppository Commonly known as: DULCOLAX Place 1 suppository (10 mg total) rectally daily as needed for moderate constipation or severe constipation.   busPIRone  5 MG tablet Commonly known as: BUSPAR  TAKE 1 TABLET BY MOUTH 3 TIMES DAILY AS NEEDED   cefdinir  300 MG capsule Commonly known as: OMNICEF  Take 1 capsule (300 mg total) by mouth 2 (two) times daily.   dextromethorphan -guaiFENesin  30-600 MG 12hr tablet Commonly known as: MUCINEX  DM Take 1 tablet by mouth 2 (two) times daily as needed for cough.   Enulose  10 GM/15ML Soln Generic drug: lactulose  (encephalopathy) PLACE 30 MLS (20G TOTAL) INTO FEEDING TUBE TWICE A DAY   fentaNYL  12 MCG/HR Commonly known as: DURAGESIC  Place 1 patch onto the skin every 3 (three) days.   free water   Soln Place 60 mLs into feeding tube 5 (five) times daily.   lidocaine  5 % Commonly known as: LIDODERM  Place 1 patch onto the skin daily. Remove & Discard patch within 12 hours or as directed by MD   meloxicam  7.5 MG tablet Commonly known as: MOBIC  TAKE 1 TABLET BY MOUTH DAILY   methylPREDNISolone  4 MG Tbpk tablet Commonly known as: MEDROL  DOSEPAK Take 6 pills on day 1, 5 pills on day 2, 4 pills on day 3, 3 pills on day 4, 2  pills on day 5, 1 pill on day 6   metoprolol  succinate 25 MG 24 hr tablet Commonly known as: TOPROL -XL TAKE 1 TABLET BY MOUTH DAILY   montelukast  10 MG tablet Commonly known as: SINGULAIR  Take 10 mg by mouth daily.   Nutren 1.5 Liqd Give 1 carton 4 times a day (8am, noon, 4pm and 8pm). Flush with 60ml of water  before and after each feeding.  Give additional 1 1/2 cups of water  a day via tube or drink orally for adequate hydration.   omeprazole  2 mg/mL Susp oral suspension Commonly known as: KONVOMEP  Take 20 mLs (40 mg total) by mouth daily.   ondansetron  4 MG tablet Commonly known as: Zofran  Take 1 tablet (4 mg total) by mouth every 6 (six) hours as needed for nausea or vomiting.   oxyCODONE  5 MG/5ML solution Commonly known as: ROXICODONE  Take 5 mLs (5 mg total) by mouth every 6 (six) hours as needed for severe pain (pain score 7-10).   rOPINIRole  0.5 MG tablet Commonly known as: REQUIP  Take 1 tablet (0.5 mg total) by mouth 3 (three) times daily.   rosuvastatin  40 MG tablet Commonly known as: CRESTOR  Place 1 tablet (40 mg total) into feeding tube daily.   senna 8.6 MG Tabs tablet Commonly known as: SENOKOT Place 1 tablet (8.6 mg total) into feeding tube daily.   simethicone  80 MG chewable tablet Commonly known as: MYLICON Place 1 tablet (80 mg total) into feeding tube 4 (four) times daily.   sucralfate  1 GM/10ML suspension Commonly known as: CARAFATE  TAKE 10 MLS (1G TOTAL) BY MOUTH FOUR TIMES A DAY   Tiotropium Bromide -Olodaterol 2.5-2.5 MCG/ACT Aers Inhale 2 puffs into the lungs daily.   triamcinolone  cream 0.1 % Commonly known as: KENALOG  Apply 1 Application topically 2 (two) times daily.        MEDICATION ADJUSTMENTS/LABS AND TESTS ORDERED: Start prednisone  20 mg daily for 7 days for bronchitis Start Z pak for bronchitis  Start TRELEGY one puff once per day Please rinse mouth after every use  Please stop SPIRIVA  Continue Medication for Blood Clot  Eliquis   Avoid Allergens and Irritants Avoid secondhand smoke Avoid SICK contacts Recommend  Masking  when appropriate Recommend Keep up-to-date with vaccinations  Follow up ONCOLOGY MAY 5TH with Dr Randy Buttery May need to consider PET SCAN- I will order so that Dr Randy Buttery can assess as well   CURRENT MEDICATIONS REVIEWED AT LENGTH WITH PATIENT TODAY   Patient  satisfied with Plan of action and management. All questions answered   Follow up 3 months   I spent a total of 45 minutes reviewing chart data, face-to-face evaluation with the patient, counseling and coordination of care as detailed above.      Lady Pier, M.D.  [facility]  Medical Director [facility] Medical Director [facility] Cardio-Pulmonary Department

## 2023-08-30 NOTE — Telephone Encounter (Signed)
 Call can not be completed as dialed. Ok for E2C2 to verify per provider in regards to rx

## 2023-08-30 NOTE — Patient Instructions (Addendum)
 Start prednisone  20 mg daily for 7 days for bronchitis Start Z pak for bronchitis  Start TRELEGY one puff once per day Please rinse mouth after every use  Please stop SPIRIVA  Continue Medication for Blood Clot Eliquis   Avoid Allergens and Irritants Avoid secondhand smoke Avoid SICK contacts Recommend  Masking  when appropriate Recommend Keep up-to-date with vaccinations  Follow up ONCOLOGY MAY 5TH with Dr Randy Buttery May need to consider PET SCAN- I will order so that Dr Randy Buttery can assess as well

## 2023-08-31 NOTE — Telephone Encounter (Signed)
 "  Call cannot be completed as dialed"

## 2023-09-04 ENCOUNTER — Telehealth: Payer: Self-pay | Admitting: *Deleted

## 2023-09-04 ENCOUNTER — Encounter: Payer: Self-pay | Admitting: Surgery

## 2023-09-04 ENCOUNTER — Inpatient Hospital Stay (HOSPITAL_BASED_OUTPATIENT_CLINIC_OR_DEPARTMENT_OTHER): Payer: Medicare HMO | Admitting: Oncology

## 2023-09-04 ENCOUNTER — Ambulatory Visit (INDEPENDENT_AMBULATORY_CARE_PROVIDER_SITE_OTHER): Admitting: Surgery

## 2023-09-04 ENCOUNTER — Other Ambulatory Visit: Payer: Self-pay

## 2023-09-04 ENCOUNTER — Inpatient Hospital Stay: Payer: Medicare HMO | Attending: Oncology

## 2023-09-04 ENCOUNTER — Encounter: Payer: Self-pay | Admitting: Oncology

## 2023-09-04 VITALS — BP 124/79 | HR 77 | Temp 98.2°F | Ht 61.0 in | Wt 109.2 lb

## 2023-09-04 DIAGNOSIS — E538 Deficiency of other specified B group vitamins: Secondary | ICD-10-CM | POA: Insufficient documentation

## 2023-09-04 DIAGNOSIS — R590 Localized enlarged lymph nodes: Secondary | ICD-10-CM | POA: Diagnosis not present

## 2023-09-04 DIAGNOSIS — Z8521 Personal history of malignant neoplasm of larynx: Secondary | ICD-10-CM | POA: Insufficient documentation

## 2023-09-04 DIAGNOSIS — Z87891 Personal history of nicotine dependence: Secondary | ICD-10-CM | POA: Insufficient documentation

## 2023-09-04 DIAGNOSIS — Z808 Family history of malignant neoplasm of other organs or systems: Secondary | ICD-10-CM | POA: Insufficient documentation

## 2023-09-04 DIAGNOSIS — Z931 Gastrostomy status: Secondary | ICD-10-CM

## 2023-09-04 DIAGNOSIS — C3492 Malignant neoplasm of unspecified part of left bronchus or lung: Secondary | ICD-10-CM

## 2023-09-04 DIAGNOSIS — G893 Neoplasm related pain (acute) (chronic): Secondary | ICD-10-CM

## 2023-09-04 DIAGNOSIS — Z9221 Personal history of antineoplastic chemotherapy: Secondary | ICD-10-CM

## 2023-09-04 DIAGNOSIS — Z853 Personal history of malignant neoplasm of breast: Secondary | ICD-10-CM | POA: Diagnosis not present

## 2023-09-04 DIAGNOSIS — R1310 Dysphagia, unspecified: Secondary | ICD-10-CM | POA: Insufficient documentation

## 2023-09-04 DIAGNOSIS — Z95828 Presence of other vascular implants and grafts: Secondary | ICD-10-CM

## 2023-09-04 DIAGNOSIS — C3490 Malignant neoplasm of unspecified part of unspecified bronchus or lung: Secondary | ICD-10-CM | POA: Diagnosis not present

## 2023-09-04 DIAGNOSIS — Z923 Personal history of irradiation: Secondary | ICD-10-CM

## 2023-09-04 LAB — CBC WITH DIFFERENTIAL (CANCER CENTER ONLY)
Abs Immature Granulocytes: 0.03 10*3/uL (ref 0.00–0.07)
Basophils Absolute: 0 10*3/uL (ref 0.0–0.1)
Basophils Relative: 0 %
Eosinophils Absolute: 0 10*3/uL (ref 0.0–0.5)
Eosinophils Relative: 0 %
HCT: 36.3 % (ref 36.0–46.0)
Hemoglobin: 11.7 g/dL — ABNORMAL LOW (ref 12.0–15.0)
Immature Granulocytes: 1 %
Lymphocytes Relative: 6 %
Lymphs Abs: 0.4 10*3/uL — ABNORMAL LOW (ref 0.7–4.0)
MCH: 30.8 pg (ref 26.0–34.0)
MCHC: 32.2 g/dL (ref 30.0–36.0)
MCV: 95.5 fL (ref 80.0–100.0)
Monocytes Absolute: 0.1 10*3/uL (ref 0.1–1.0)
Monocytes Relative: 2 %
Neutro Abs: 6.1 10*3/uL (ref 1.7–7.7)
Neutrophils Relative %: 91 %
Platelet Count: 184 10*3/uL (ref 150–400)
RBC: 3.8 MIL/uL — ABNORMAL LOW (ref 3.87–5.11)
RDW: 13.3 % (ref 11.5–15.5)
WBC Count: 6.6 10*3/uL (ref 4.0–10.5)
nRBC: 0 % (ref 0.0–0.2)

## 2023-09-04 LAB — CMP (CANCER CENTER ONLY)
ALT: 87 U/L — ABNORMAL HIGH (ref 0–44)
AST: 71 U/L — ABNORMAL HIGH (ref 15–41)
Albumin: 3.6 g/dL (ref 3.5–5.0)
Alkaline Phosphatase: 79 U/L (ref 38–126)
Anion gap: 8 (ref 5–15)
BUN: 25 mg/dL — ABNORMAL HIGH (ref 8–23)
CO2: 25 mmol/L (ref 22–32)
Calcium: 8.8 mg/dL — ABNORMAL LOW (ref 8.9–10.3)
Chloride: 101 mmol/L (ref 98–111)
Creatinine: 0.6 mg/dL (ref 0.44–1.00)
GFR, Estimated: >60 mL/min (ref >60)
Glucose, Bld: 211 mg/dL — ABNORMAL HIGH (ref 70–99)
Potassium: 4.5 mmol/L (ref 3.5–5.1)
Sodium: 134 mmol/L — ABNORMAL LOW (ref 135–145)
Total Bilirubin: 0.4 mg/dL (ref 0.0–1.2)
Total Protein: 6.3 g/dL — ABNORMAL LOW (ref 6.5–8.1)

## 2023-09-04 MED ORDER — HEPARIN SOD (PORK) LOCK FLUSH 100 UNIT/ML IV SOLN
500.0000 [IU] | Freq: Once | INTRAVENOUS | Status: AC
  Administered 2023-09-04: 500 [IU] via INTRAVENOUS
  Filled 2023-09-04: qty 5

## 2023-09-04 MED ORDER — SODIUM CHLORIDE 0.9% FLUSH
10.0000 mL | Freq: Once | INTRAVENOUS | Status: AC
  Administered 2023-09-04: 10 mL via INTRAVENOUS
  Filled 2023-09-04: qty 10

## 2023-09-04 NOTE — Telephone Encounter (Signed)
 Patient called and said that her feeding tube is leaking and its got blood all around it and she has an appointment with Dr. Randy Buttery today at 1:00 for labs and then see her but she feels like the pain from this tube is bad and she does not know if she needs to go to the ER.  Per Dr. Randy Buttery she suggest to call Dr. Oretha Birch office to see if they can take care of her with her feeding tube today.  Also Dr. Randy Buttery says that if she has to do all that she might need to have another appointment on another day but the patient really wants to be seen today by Dr. Randy Buttery.  I called Dr. Sarajane Cumming office and they are sending a message to Dr. Emilio Harder to see if he can get her in today and they are calling me back.

## 2023-09-04 NOTE — Patient Instructions (Addendum)
 Please call the office if you have any questions or concerns.  I sent a referral to [facility] for an exchanged of G Tube, they will be calling you for an appointment. If you don't hear from them by tomorrow 09/05/23, please call our office and let us  know at [phone]

## 2023-09-04 NOTE — Progress Notes (Signed)
 Hematology/Oncology Consult note [facility]  [phone] [phone]  Patient Care Team: Carlean Charter, DO as PCP - General (Family Medicine) Constancia Delton, MD as PCP - Cardiology (Cardiology) Avonne Boettcher, MD as Consulting Physician (Oncology) Alben Alma, MD as Consulting Physician (General Surgery) Dasher, Margette Sheldon, MD (Dermatology) Scheeler, Janalyn Me, PA-C as Physician Assistant (Plastic Surgery) Ruth Cove, MD as Attending Physician (Emergency Medicine) Jonn Nett [place] Bake (Neurology) Marien Short, MD as Referring Physician (Internal Medicine) Wilder Handy, MD (Inactive) as Consulting Physician (Pulmonary Disease) Pa, [facility] (Optometry) Drake Gens, RN as Oncology Nurse Navigator Glenis Langdon, MD as Consulting Physician (Radiation Oncology) Claudene Crystal, RN as [facility], [place], LCSW as [facility], [place], [place]   Name of the patient: Denise Watts  [id number]  July 23, 1970   Date of visit: 09/04/23  Diagnosis-non-small cell carcinoma of unknown primary  Chief complaint/ Reason for visit-discuss CT scan results and further management  Heme/Onc history:  Oncology History Overview Note  1. Carcinoma of breast, T1, N0, M0 tumor diagnosis.  In January of 1997 2. Carcinoma of the vocal cord T1, N0, M0 tumor.  Status postradiation therapy 3. Abnormal CT scan of the chest (December, 2015) 4. Repeat CT scan of chest shows stable nodule (July, 2016) 5. Neutropenia with normal hemoglobin and normal platelet count (July, 2016)   Cancer of vocal cord [facility]) (Resolved)  11/10/2014 Initial Diagnosis   Cancer of vocal cord   Malignant neoplasm of lower-outer quadrant of right breast of female, estrogen receptor negative (HCC)  11/24/2017 Initial Diagnosis   Malignant neoplasm of lower-outer quadrant of right breast of female, estrogen receptor  negative (HCC)   01/12/2018 Cancer Staging   Staging form: Breast, AJCC 8th Edition - Clinical stage from 01/12/2018: Stage IB (cT1a, cN0, cM0, G3, ER+, PR-, HER2-) - Signed by Avonne Boettcher, MD on 01/12/2018   01/30/2018 - 04/26/2018 Chemotherapy   The patient had dexamethasone  (DECADRON ) 4 MG tablet, 8 mg, Oral, 2 times daily, 1 of 1 cycle, Start date: 01/12/2018, End date: 08/03/2018 palonosetron  (ALOXI ) injection 0.25 mg, 0.25 mg, Intravenous,  Once, 4 of 4 cycles Administration: 0.25 mg (01/30/2018), 0.25 mg (02/27/2018), 0.25 mg (03/28/2018), 0.25 mg (04/26/2018) pegfilgrastim  (NEULASTA  ONPRO KIT) injection 6 mg, 6 mg, Subcutaneous, Once, 4 of 4 cycles Administration: 6 mg (01/30/2018), 6 mg (02/27/2018), 6 mg (03/28/2018), 6 mg (04/26/2018) cyclophosphamide  (CYTOXAN ) 1,000 mg in sodium chloride  0.9 % 250 mL chemo infusion, 600 mg/m2 = 1,000 mg, Intravenous,  Once, 4 of 4 cycles Administration: 1,000 mg (01/30/2018), 1,000 mg (02/27/2018), 1,000 mg (03/28/2018), 1,000 mg (04/26/2018) DOCEtaxel  (TAXOTERE ) 130 mg in sodium chloride  0.9 % 250 mL chemo infusion, 75 mg/m2 = 130 mg, Intravenous,  Once, 4 of 4 cycles Dose modification: 60 mg/m2 (original dose 75 mg/m2, Cycle 2, Reason: Dose not tolerated) Administration: 130 mg (01/30/2018), 100 mg (02/27/2018), 100 mg (03/28/2018), 100 mg (04/26/2018)  for chemotherapy treatment.    Non-small cell lung cancer (HCC)  03/09/2023 Initial Diagnosis   Non-small cell lung cancer (HCC)   03/20/2023 -  Chemotherapy   Patient is on Treatment Plan : LUNG Carboplatin  + Paclitaxel  q7d       The patient was originally diagnosed with right breast cancer in 1997, 10 mm grade 3, ER pr positive her 2 negative, node negative status post surgery and radiation treatment only. She never received chemotherapy or hormone therapy. For her vocal cord cancer,  she had extensive surgery and radiation therapy in 1998. Due to past history of smoking, she was also noted to have  lung nodules. She is undergoing active surveillance imaging study of the chest.   Patient noted to have adrenal mass on CT chest which prompted PET CT in July 2018 which showed: IMPRESSION: 1. Right adrenal mass with peripheral hypermetabolism. Given interval development since 11/18/2015, suspicious for either isolated metastasis or a primary adrenal neoplasm. This should be considered for biopsy. 2. No evidence of hypermetabolic primary malignancy or extraadrenal metastasis. Pulmonary nodules are not significantly hypermetabolic. 3. Coronary artery atherosclerosis. Aortic Atherosclerosis (ICD10-I70.0). 4. Hepatic steatosis. 5. Left sixth rib hypermetabolism is favored to be related to remote Trauma.   Patient underwent right adrenalectomy which showed: DIAGNOSIS:  A. ADRENAL GLAND, RIGHT; ADRENALECTOMY:  - HIGH GRADE CARCINOMA WITH EXTENSIVE NECROSIS.  - MARGINS OF THE SPECIMEN ARE NEGATIVE FOR CARCINOMA.   Comment:  Sections demonstrate abundant necrosis confined to the adrenal medullary  compartment. In one block of tissue a small fragment of viable malignant  neoplasm was identified. A panel of immunohistochemical stains was  performed with the following pattern of immunoreactivity:  Super pancytokeratin: Positive  GATA-3: Negative  TTF-1: Negative  P40: Negative  Napsin: Negative  PAX-8: Negative  Chromogranin: Negative     Repeat CT chest abdomen and pelvis in July 2019 showed stable left lower lobe pulmonary nodule.  No other evidence of metastatic disease.  New 4.5 mm nodule in the right breast.  This was followed by ultrasound mammogram and core biopsy of the breast lesion which was a grade 3 triple negative breast cancer.  Patient was seen by Dr. Marquita Situ and underwent lumpectomy on 12/08/2017.   Patient had a 5 mm weakly ER positive tumor.  Given her ongoing fatigue Oncotype testing was done to see if she could potentially avoid chemotherapy.  Oncotype shows a recurrence  score of 53 with her risk of distant recurrence at 9 years at greater than 39% with hormone therapy alone.  Absolute benefit of chemotherapy was greater than 15% in her age group.  Patient completed 4 cycles of adjuvant TC chemotherapy in December 2019    Patient has known bilateral pulmonary nodules which have been monitored and she was again found to have a left lower lobe lung nodule measuring 13 mm unchanged as compared to prior exams.  There were also new subcentimeter lung nodules detected and follow-up CT was recommended.  CT angio chest in August 2024 showed increase in the size of left upper lobe lung nodule and new multiple pulmonary nodules concerning for metastatic disease.  This was followed by a PET CT scan which showed hypermetabolic intrathoracic nodal adenopathy measuring 5 to 13 mm in size along with lung nodules with a maximum size measuring 13 mm with an SUV of 9.2.   CT-guided biopsy was nondiagnostic she is patient underwent bronchoscopy but lymph nodes were not sampled at that time due to equipment malfunction.Bronchial lavage from left upper lobe was positive for malignancy non-small cell carcinoma.  But no further subclassification whether this is primary lung cancer or metastatic carcinoma could be done due to limited specimen. EBUS station 7 LN also shows non small cell carcinoma   Patient underwent concurrent chemoradiation with weekly CarboTaxol which she tolerated poorly.  Patient currently received 4 cycles of chemotherapy due to progressive fatigue and poor oral intake    Interval history-patient is able to have some oral intake but is still relying on her PEG  tube feeds for nutrition.  She was seen by Dr. Dana Duncan today and plan is to change her PEG tube likely to a bigger size to allow for less leakage  ECOG PS- 2 Pain scale- 3 Opioid associated constipation- no  Review of systems- Review of Systems  Constitutional:  Positive for malaise/fatigue. Negative for chills,  fever and weight loss.  HENT:  Negative for congestion, ear discharge and nosebleeds.   Eyes:  Negative for blurred vision.  Respiratory:  Negative for cough, hemoptysis, sputum production, shortness of breath and wheezing.   Cardiovascular:  Negative for chest pain, palpitations, orthopnea and claudication.  Gastrointestinal:  Negative for abdominal pain, blood in stool, constipation, diarrhea, heartburn, melena, nausea and vomiting.  Genitourinary:  Negative for dysuria, flank pain, frequency, hematuria and urgency.  Musculoskeletal:  Positive for back pain, joint pain and myalgias.  Skin:  Negative for rash.  Neurological:  Negative for dizziness, tingling, focal weakness, seizures, weakness and headaches.  Endo/Heme/Allergies:  Does not bruise/bleed easily.  Psychiatric/Behavioral:  Negative for depression and suicidal ideas. The patient does not have insomnia.       Allergies  Allergen Reactions   Sulfa  Antibiotics Other (See Comments)    Mouth sore and raw     Past Medical History:  Diagnosis Date   Acute pulmonary embolism (HCC) 05/10/2023   Adenomatous colon polyp    Adrenal carcinoma, right (HCC) 12/28/2016   a.) high grade carcinoma with extensive necrosis --> s/p adrenalectomy   Anxiety    Aortic atherosclerosis (HCC)    Arthritis of right knee    Atrial fibrillation and flutter (HCC)    a.) CHA2DS2-VASc = 3 (age, sex, vascular disease history) as of 02/13/2023; b.) cardiac rate/rhythm maintained intrinsically without pharmacological intervention; no chronic OAC (does take low dose ASA)   B12 deficiency    Breast cancer (HCC) 1997   a.) stage IB (cT1a, cN0, cM0, G3, ER+, PR-, HER2-); s/p lumpectomy + XRT   COPD (chronic obstructive pulmonary disease) (HCC)    Coronary artery disease    a.) cCTA 12/09/2021: Ca2+ = 135 (80th %'ile for age/sex/race match contol) --> 25-49% pLAD and RCA distributions   Depression    Diastolic dysfunction    a.) TTE 12/11/2020, no  RWMAs, norm RVSF, G1DD; b.) TTE 12/14/2021: EF 55-60, no RWMAs, norm RVSF, G1DD; c.) TTE 01/27/2023: EF 50-55%, no RWMAs, G1DD, mild RAE, mild MR, mod TR   Dry cough 03/02/2017   Dyspnea    Full dentures    GERD (gastroesophageal reflux disease)    History of hiatal hernia    HLD (hyperlipidemia)    Invasive carcinoma of breast (HCC) 11/15/2017   a.) path (+) for G3, ER -, PR -, HER2/neu - ; s/p lumpectomy 01/17/2018 + 4 cycles TC systemic chemotherapy + XRT   Long term current use of aspirin     Personal history of chemotherapy    Personal history of radiation therapy    Pneumothorax of left lung after biopsy 01/25/2023   a.) hypoxic in PACU requiring re-intubation --> transferred to ICU --> chest tube placed by PCCM   RLS (restless legs syndrome)    a.) on ropinirole    Skin cancer    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   a.) Stage IB (cT1a, cN0, cM0, G3, ER+, PR-, HER2-)     Past Surgical History:  Procedure Laterality Date   BIOPSY  01/05/2023   Procedure: BIOPSY;  Surgeon: Selena Daily, MD;  Location: [facility];  Service: Gastroenterology;;   BREAST BIOPSY Right 1997   positive   BREAST BIOPSY Right 11/15/2017   INVASIVE MAMMARY CARCINOMA triple negative   BREAST BIOPSY Right 07/20/2020   us  bx, Q marker, neg   BREAST LUMPECTOMY Right 1997   2019 also   BREAST LUMPECTOMY WITH SENTINEL LYMPH NODE BIOPSY Right 12/08/2017   Procedure: BREAST LUMPECTOMY WITH SENTINEL LYMPH NODE BX;  Surgeon: Marshall Skeeter, MD;  Location: [facility];  Service: General;  Laterality: Right;   BREAST REDUCTION WITH MASTOPEXY Left 06/10/2019   Procedure: Left breast mastopexy reduction for symmetry;  Surgeon: Thornell Flirt, DO;  Location: [facility];  Service: Plastics;  Laterality: Left;  total 2.5 hours   CATARACT EXTRACTION W/PHACO Right 02/07/2022   Procedure: CATARACT EXTRACTION PHACO AND INTRAOCULAR LENS PLACEMENT (IOC) RIGHT DIABETIC;  Surgeon:  Rosa College, MD;  Location: [facility];  Service: Ophthalmology;  Laterality: Right;  3.60 00:34.7   CATARACT EXTRACTION W/PHACO Left 02/28/2022   Procedure: CATARACT EXTRACTION PHACO AND INTRAOCULAR LENS PLACEMENT (IOC) LEFT DIABETIC 4.11 00:26.1;  Surgeon: Rosa College, MD;  Location: [facility];  Service: Ophthalmology;  Laterality: Left;   COLONOSCOPY  2015   COLONOSCOPY N/A 01/05/2023   Procedure: COLONOSCOPY;  Surgeon: Selena Daily, MD;  Location: [facility];  Service: Gastroenterology;  Laterality: N/A;   COLONOSCOPY WITH PROPOFOL  N/A 10/29/2019   Procedure: COLONOSCOPY WITH PROPOFOL ;  Surgeon: Marnee Sink, MD;  Location: [facility];  Service: Endoscopy;  Laterality: N/A;   ESOPHAGOGASTRODUODENOSCOPY (EGD) WITH PROPOFOL  N/A 05/24/2019   Procedure: ESOPHAGOGASTRODUODENOSCOPY (EGD) WITH PROPOFOL ;  Surgeon: Marnee Sink, MD;  Location: [facility];  Service: Endoscopy;  Laterality: N/A;   ESOPHAGOGASTRODUODENOSCOPY (EGD) WITH PROPOFOL  N/A 09/06/2022   Procedure: ESOPHAGOGASTRODUODENOSCOPY (EGD) WITH PROPOFOL ;  Surgeon: Marnee Sink, MD;  Location: [facility];  Service: Endoscopy;  Laterality: N/A;   ESOPHAGOGASTRODUODENOSCOPY (EGD) WITH PROPOFOL   01/05/2023   Procedure: ESOPHAGOGASTRODUODENOSCOPY (EGD) WITH PROPOFOL ;  Surgeon: Selena Daily, MD;  Location: [facility];  Service: Gastroenterology;;   ESOPHAGUS SURGERY     GASTROSTOMY N/A 05/25/2023   Procedure: OPEN INSERTION OF GASTROSTOMY TUBE;  Surgeon: Alben Alma, MD;  Location: [facility];  Service: General;  Laterality: N/A;   IR IMAGING GUIDED PORT INSERTION  02/07/2023   KNEE SURGERY Right    LIPOSUCTION WITH LIPOFILLING Right 06/10/2019   Procedure: right breast scar and capsule release with fat grafting;  Surgeon: Thornell Flirt, DO;  Location: [facility];  Service: Plastics;  Laterality: Right;   POLYPECTOMY  01/05/2023   Procedure: POLYPECTOMY;  Surgeon:  Selena Daily, MD;  Location: [facility];  Service: Gastroenterology;;   [facility] REMOVAL     PORTACATH PLACEMENT Right 01/17/2018   Procedure: INSERTION PORT-A-CATH- RIGHT;  Surgeon: Marshall Skeeter, MD;  Location: [facility];  Service: General;  Laterality: Right;   PULMONARY THROMBECTOMY Bilateral 05/12/2023   Procedure: PULMONARY THROMBECTOMY;  Surgeon: Celso College, MD;  Location: [facility];  Service: Cardiovascular;  Laterality: Bilateral;   ROBOTIC ADRENALECTOMY Right 12/28/2016   Procedure: ROBOTIC ADRENALECTOMY;  Surgeon: Dustin Gimenez, MD;  Location: [facility];  Service: Urology;  Laterality: Right;   SKIN CANCER EXCISION     THROAT SURGERY  1998   throat cancer    TUBAL LIGATION     VENTRAL HERNIA REPAIR N/A 03/03/2017   Procedure: HERNIA REPAIR VENTRAL ADULT;  Surgeon: Gwyndolyn Lerner, MD;  Location: [facility];  Service: General;  Laterality:  N/A;   VIDEO BRONCHOSCOPY WITH ENDOBRONCHIAL ULTRASOUND N/A 01/25/2023   Procedure: VIDEO BRONCHOSCOPY WITH ENDOBRONCHIAL ULTRASOUND;  Surgeon: Erskin Hearing, MD;  Location: [facility];  Service: Thoracic;  Laterality: N/A;   VIDEO BRONCHOSCOPY WITH ENDOBRONCHIAL ULTRASOUND N/A 02/17/2023   Procedure: VIDEO BRONCHOSCOPY WITH ENDOBRONCHIAL ULTRASOUND;  Surgeon: Erskin Hearing, MD;  Location: [facility];  Service: Thoracic;  Laterality: N/A;    Social History   Socioeconomic History   Marital status: Married    Spouse name: Tiarah, Desilets (Spouse) [phone] ([place])   Number of children: 4   Years of education: Not on file   Highest education level: 10th grade  Occupational History   Occupation: retired  Tobacco Use   Smoking status: Former    Current packs/day: 0.00    Average packs/day: 1.5 packs/day for 30.0 years (45.0 ttl pk-yrs)    Types: Cigarettes    Start date: 05/02/1965    Quit date: 05/03/1995    Years since quitting: 28.3    Passive exposure: Past   Smokeless tobacco: Never  Vaping Use   Vaping  status: Never Used  Substance and Sexual Activity   Alcohol use: Not Currently    Alcohol/week: 4.0 standard drinks of alcohol    Types: 4 Shots of liquor per week    Comment: none   Drug use: No   Sexual activity: Not Currently    Birth control/protection: Post-menopausal  Other Topics Concern   Not on file  Social History Narrative   Patient from home with husband.   Social Drivers of Corporate investment banker Strain: Low Risk  (05/19/2023)   Overall Financial Resource Strain (CARDIA)    Difficulty of Paying Living Expenses: Not very hard  Food Insecurity: No Food Insecurity (07/03/2023)   Hunger Vital Sign    Worried About Running Out of Food in the Last Year: Never true    Ran Out of Food in the Last Year: Never true  Transportation Needs: No Transportation Needs (07/03/2023)   PRAPARE - Administrator, Civil Service (Medical): No    Lack of Transportation (Non-Medical): No  Physical Activity: Inactive (05/19/2023)   Exercise Vital Sign    Days of Exercise per Week: 0 days    Minutes of Exercise per Session: 0 min  Stress: Stress Concern Present (05/19/2023)   [employer] of Occupational Health - Occupational Stress Questionnaire    Feeling of Stress : To some extent  Social Connections: Moderately Isolated (05/24/2023)   Social Connection and Isolation Panel [NHANES]    Frequency of Communication with Friends and Family: More than three times a week    Frequency of Social Gatherings with Friends and Family: More than three times a week    Attends Religious Services: Never    Database administrator or Organizations: No    Attends Banker Meetings: Never    Marital Status: Married  Catering manager Violence: Not At Risk (07/03/2023)   Humiliation, Afraid, Rape, and Kick questionnaire    Fear of Current or Ex-Partner: No    Emotionally Abused: No    Physically Abused: No    Sexually Abused: No    Family History  Problem Relation Age of Onset    Diabetes Mother    Heart attack Mother    Cancer Sister        cancer of eyes, spread to lung, other places. died at 48   Cancer Sister 31       unknown primary, spread to  bone, brain died in 32's   Cancer Paternal Aunt        unk type   Cancer Paternal Grandmother        type unk   Prostate cancer Neg Hx    Kidney cancer Neg Hx    Bladder Cancer Neg Hx    Breast cancer Neg Hx      Current Outpatient Medications:    albuterol  (VENTOLIN  HFA) 108 (90 Base) MCG/ACT inhaler, Inhale 2 puffs into the lungs every 6 (six) hours as needed for wheezing or shortness of breath., Disp: 51 each, Rfl: 2   apixaban  (ELIQUIS ) 5 MG TABS tablet, Take 1 tablet (5 mg total) by mouth 2 (two) times daily., Disp: 60 tablet, Rfl: 3   benzonatate  (TESSALON ) 100 MG capsule, Take 2 capsules (200 mg total) by mouth 3 (three) times daily as needed for cough., Disp: , Rfl:    bisacodyl  (DULCOLAX) 10 MG suppository, Place 1 suppository (10 mg total) rectally daily as needed for moderate constipation or severe constipation., Disp: , Rfl:    busPIRone  (BUSPAR ) 5 MG tablet, TAKE 1 TABLET BY MOUTH 3 TIMES DAILY AS NEEDED, Disp: 270 tablet, Rfl: 0   cefdinir  (OMNICEF ) 300 MG capsule, Take 1 capsule (300 mg total) by mouth 2 (two) times daily., Disp: 20 capsule, Rfl: 0   fentaNYL  (DURAGESIC ) 12 MCG/HR, Place 1 patch onto the skin every 3 (three) days., Disp: 10 patch, Rfl: 0   Fluticasone -Umeclidin-Vilant (TRELEGY ELLIPTA ) 100-62.5-25 MCG/ACT AEPB, Inhale 1 Act into the lungs daily., Disp: 1 each, Rfl: 5   lactulose , encephalopathy, (ENULOSE ) 10 GM/15ML SOLN, PLACE 30 MLS (20G TOTAL) INTO FEEDING TUBE TWICE A DAY, Disp: 946 mL, Rfl: 0   lidocaine  (LIDODERM ) 5 %, Place 1 patch onto the skin daily. Remove & Discard patch within 12 hours or as directed by MD, Disp: 30 patch, Rfl: 2   meloxicam  (MOBIC ) 7.5 MG tablet, TAKE 1 TABLET BY MOUTH DAILY, Disp: 90 tablet, Rfl: 1   metoprolol  succinate (TOPROL -XL) 25 MG 24 hr tablet,  TAKE 1 TABLET BY MOUTH DAILY, Disp: 30 tablet, Rfl: 4   montelukast  (SINGULAIR ) 10 MG tablet, Take 10 mg by mouth daily., Disp: , Rfl:    Nutritional Supplements (NUTREN 1.5) LIQD, Give 1 carton 4 times a day (8am, noon, 4pm and 8pm). Flush with 60ml of water  before and after each feeding.  Give additional 1 1/2 cups of water  a day via tube or drink orally for adequate hydration., Disp: , Rfl:    omeprazole  (KONVOMEP ) 2 mg/mL SUSP oral suspension, Take 20 mLs (40 mg total) by mouth daily., Disp: 240 mL, Rfl: 1   ondansetron  (ZOFRAN ) 4 MG tablet, Take 1 tablet (4 mg total) by mouth every 6 (six) hours as needed for nausea or vomiting., Disp: , Rfl:    oxyCODONE  (ROXICODONE ) 5 MG/5ML solution, Take 5 mLs (5 mg total) by mouth every 6 (six) hours as needed for severe pain (pain score 7-10)., Disp: 200 mL, Rfl: 0   predniSONE  (DELTASONE ) 20 MG tablet, Take 1 tablet (20 mg total) by mouth daily with breakfast. 7 days, Disp: 7 tablet, Rfl: 1   rOPINIRole  (REQUIP ) 0.5 MG tablet, Take 1 tablet (0.5 mg total) by mouth 3 (three) times daily., Disp: 90 tablet, Rfl: 1   rosuvastatin  (CRESTOR ) 40 MG tablet, Place 1 tablet (40 mg total) into feeding tube daily., Disp: , Rfl:    senna (SENOKOT) 8.6 MG TABS tablet, Place 1 tablet (8.6 mg total) into feeding  tube daily., Disp: , Rfl:    simethicone  (MYLICON) 80 MG chewable tablet, Place 1 tablet (80 mg total) into feeding tube 4 (four) times daily., Disp: , Rfl:    sucralfate  (CARAFATE ) 1 GM/10ML suspension, TAKE 10 MLS (1G TOTAL) BY MOUTH FOUR TIMES A DAY, Disp: 420 mL, Rfl: 1   tolterodine  (DETROL  LA) 4 MG 24 hr capsule, Take 4 mg by mouth daily., Disp: , Rfl:    triamcinolone  cream (KENALOG ) 0.1 %, Apply 1 Application topically 2 (two) times daily., Disp: , Rfl:    Water  For Irrigation, Sterile (FREE WATER ) SOLN, Place 60 mLs into feeding tube 5 (five) times daily., Disp: , Rfl:  No current facility-administered medications for this visit.  Facility-Administered  Medications Ordered in Other Visits:    cyanocobalamin  ((VITAMIN B-12)) injection 1,000 mcg, 1,000 mcg, Intramuscular, Q30 days, Avonne Boettcher, MD, 1,000 mcg at 08/27/21 1537   cyanocobalamin  (VITAMIN B12) injection 1,000 mcg, 1,000 mcg, Intramuscular, Q30 days, Avonne Boettcher, MD, 1,000 mcg at 03/16/22 1101  Physical exam:  Vitals:   09/04/23 1336  BP: (!) 109/54  Pulse: 84  Resp: 18  Temp: (!) 96.9 F (36.1 C)  SpO2: 97%  Weight: 108 lb 12.8 oz (49.4 kg)   Physical Exam Cardiovascular:     Rate and Rhythm: Normal rate and regular rhythm.     Heart sounds: Normal heart sounds.  Pulmonary:     Effort: Pulmonary effort is normal.     Breath sounds: Normal breath sounds.  Abdominal:     General: Bowel sounds are normal.     Palpations: Abdomen is soft.     Comments: Peg tube in place  Skin:    General: Skin is warm and dry.  Neurological:     Mental Status: She is alert and oriented to person, place, and time.      I have personally reviewed labs listed below:    Latest Ref Rng & Units 09/04/2023    1:18 PM  CMP  Glucose 70 - 99 mg/dL 604   BUN 8 - 23 mg/dL 25   Creatinine 5.40 - 1.00 mg/dL 9.81   Sodium 191 - 478 mmol/L 134   Potassium 3.5 - 5.1 mmol/L 4.5   Chloride 98 - 111 mmol/L 101   CO2 22 - 32 mmol/L 25   Calcium  8.9 - 10.3 mg/dL 8.8   Total Protein 6.5 - 8.1 g/dL 6.3   Total Bilirubin 0.0 - 1.2 mg/dL 0.4   Alkaline Phos 38 - 126 U/L 79   AST 15 - 41 U/L 71   ALT 0 - 44 U/L 87       Latest Ref Rng & Units 09/04/2023    1:18 PM  CBC  WBC 4.0 - 10.5 K/uL 6.6   Hemoglobin 12.0 - 15.0 g/dL 29.5   Hematocrit 62.1 - 46.0 % 36.3   Platelets 150 - 400 K/uL 184    I have personally reviewed Radiology images listed below: No images are attached to the encounter.  CT CHEST ABDOMEN PELVIS W CONTRAST Result Date: 08/30/2023 CLINICAL DATA:  Non-small cell lung cancer. Evaluate for recurrence. * Tracking Code: BO *. EXAM: CT CHEST, ABDOMEN, AND PELVIS WITH  CONTRAST TECHNIQUE: Multidetector CT imaging of the chest, abdomen and pelvis was performed following the standard protocol during bolus administration of intravenous contrast. RADIATION DOSE REDUCTION: This exam was performed according to the departmental dose-optimization program which includes automated exposure control, adjustment of the mA and/or kV according to  patient size and/or use of iterative reconstruction technique. CONTRAST:  80mL OMNIPAQUE  IOHEXOL  300 MG/ML  SOLN COMPARISON:  CT abdomen pelvis 05/27/2023, CT chest 05/09/1998 FINDINGS: CT CHEST FINDINGS Cardiovascular: No significant vascular findings. Normal heart size. No pericardial effusion. Port in the anterior chest wall with tip in distal SVC. Mediastinum/Nodes: No axillary or supraclavicular adenopathy. No mediastinal or hilar adenopathy. No pericardial fluid. Esophagus normal. Lungs/Pleura: Bilateral pulmonary nodules again noted. Largest nodule in the LEFT lower lobe measures 13 mm (image 58/4) unchanged from 13 mm on most recent CT chest. Lingular nodule measuring 18 mm (image 62) compares to 16 mm. A band of peripheral nodules in the LEFT upper lobe (image 56/4 ) is new from prior Small nodules in the RIGHT lower lobe are numerous and not changed Example 5 mm nodule on image 94/4 in the RIGHT lower lobe is unchanged. Musculoskeletal: No aggressive osseous lesion. CT ABDOMEN AND PELVIS FINDINGS Hepatobiliary: No focal hepatic lesion. No biliary ductal dilatation. Gallbladder is normal. Common bile duct is normal. Pancreas: Pancreas is normal. No ductal dilatation. No pancreatic inflammation. Spleen: Normal spleen Adrenals/urinary tract: Adrenal glands and kidneys are normal. The ureters and bladder normal. Stomach/Bowel: Percutaneous gastrostomy tube in stomach. The stomach is fluid-filled. Small bowel normal. No colon abnormality. No mesenteric adenopathy. Vascular/Lymphatic: Abdominal aorta is normal caliber with atherosclerotic  calcification. There is no retroperitoneal or periportal lymphadenopathy. No pelvic lymphadenopathy. Reproductive: Uterus and adnexa unremarkable. Other: Trace free fluid the pelvis. Musculoskeletal: No aggressive osseous lesion. IMPRESSION: CHEST: 1. Bilateral pulmonary nodules are unchanged from most recent CT chest. 2. New band of peripheral nodules in the LEFT upper lobe is favored infectious or inflammatory. Potential radiation treatment related. Recommend attention on follow-up. 3. No thoracic adenopathy. PELVIS: 1. No evidence of metastatic disease in the abdomen pelvis. 2. Percutaneous gastrostomy tube in stomach. 3.  Aortic Atherosclerosis (ICD10-I70.0). Electronically Signed   By: Deboraha Fallow M.D.   On: 08/30/2023 16:28     Assessment and plan- Patient is a 68 y.o. female with history of head and neck, breast cancer as well as adrenal carcinoma of unknown primary in the past now with non-small cell carcinoma of unknown primary predominantly with mediastinal adenopathy.   I have reviewed CT chest Abdomen and pelvis images independently and discussed findings with the patient.  After receiving concurrent chemoradiation for the lung nodules overall lung nodules have remained stable and there is no evidence of progressive disease.  Plan is to monitor this conservatively without further chemotherapy at this time given patient's frailty.  I will repeat scans again in about 4 months time.  PET scan was ordered by pulmonary and I have asked Dr. Auston Left to cancel it since she recently had a CT.  Dysphagia: Patient had her last colonoscopy by Dr. 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  She is presently on PEG tube and I will reevaluate how her oral intake is going when I see her back in 4 months.  If dysphagia persists I will have her go through another round of endoscopy  Patient has been on fentanyl  patch and as needed oxycodone  for her diffuse body aches which  we were mainly addressing when she was going through treatment for her malignancy.  I will address this issue in 4 months time and have her gradually decrease the intake of her opioids.  History of B12 deficiency: Continue monthly B12 injections   Visit Diagnosis 1. Non-small cell cancer of  left lung (HCC)   2. B12 deficiency      Dr. Seretha Dance, MD, MPH [facility] [phone] 09/04/2023 3:48 PM

## 2023-09-04 NOTE — Progress Notes (Signed)
 Outpatient Surgical Follow Up  09/04/2023  Denise Watts is an 68 y.o. female.   Chief Complaint  Patient presents with   Follow-up    G tube     HPI: Denise Watts is a very pleasant 68 year old female with widely metastatic non small cell lung CA  and recent PE.  I did open G-tube insertion 4 months ago and this was uneventful.  She does have significant medical issues and chronic cancer issues.  She is in a lot of pain and has some nauseas. Some oozing from g tube.  .  She has tolerated tube feeds and now she is on Eliquis  via the G-tube.  No fevers no chills no evidence of complications related to the G-tube.  She did have a CT scan that have personally reviewed showing evidence of well-positioned G-tube without evidence of complications    Past Medical History:  Diagnosis Date   Acute pulmonary embolism (HCC) 05/10/2023   Adenomatous colon polyp    Adrenal carcinoma, right (HCC) 12/28/2016   a.) high grade carcinoma with extensive necrosis --> s/p adrenalectomy   Anxiety    Aortic atherosclerosis (HCC)    Arthritis of right knee    Atrial fibrillation and flutter (HCC)    a.) CHA2DS2-VASc = 3 (age, sex, vascular disease history) as of 02/13/2023; b.) cardiac rate/rhythm maintained intrinsically without pharmacological intervention; no chronic OAC (does take low dose ASA)   B12 deficiency    Breast cancer (HCC) 1997   a.) stage IB (cT1a, cN0, cM0, G3, ER+, PR-, HER2-); s/p lumpectomy + XRT   COPD (chronic obstructive pulmonary disease) (HCC)    Coronary artery disease    a.) cCTA 12/09/2021: Ca2+ = 135 (80th %'ile for age/sex/race match contol) --> 25-49% pLAD and RCA distributions   Depression    Diastolic dysfunction    a.) TTE 12/11/2020, no RWMAs, norm RVSF, G1DD; b.) TTE 12/14/2021: EF 55-60, no RWMAs, norm RVSF, G1DD; c.) TTE 01/27/2023: EF 50-55%, no RWMAs, G1DD, mild RAE, mild MR, mod TR   Dry cough 03/02/2017   Dyspnea    Full dentures    GERD (gastroesophageal reflux  disease)    History of hiatal hernia    HLD (hyperlipidemia)    Invasive carcinoma of breast (HCC) 11/15/2017   a.) path (+) for G3, ER -, PR -, HER2/neu - ; s/p lumpectomy 01/17/2018 + 4 cycles TC systemic chemotherapy + XRT   Long term current use of aspirin     Personal history of chemotherapy    Personal history of radiation therapy    Pneumothorax of left lung after biopsy 01/25/2023   a.) hypoxic in PACU requiring re-intubation --> transferred to ICU --> chest tube placed by PCCM   RLS (restless legs syndrome)    a.) on ropinirole    Skin cancer    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   a.) Stage IB (cT1a, cN0, cM0, G3, ER+, PR-, HER2-)    Past Surgical History:  Procedure Laterality Date   BIOPSY  01/05/2023   Procedure: BIOPSY;  Surgeon: Selena Daily, MD;  Location: [facility];  Service: Gastroenterology;;   BREAST BIOPSY Right 1997   positive   BREAST BIOPSY Right 11/15/2017   INVASIVE MAMMARY CARCINOMA triple negative   BREAST BIOPSY Right 07/20/2020   us  bx, Q marker, neg   BREAST LUMPECTOMY Right 1997   2019 also   BREAST LUMPECTOMY WITH SENTINEL LYMPH NODE BIOPSY Right 12/08/2017   Procedure: BREAST LUMPECTOMY WITH SENTINEL  LYMPH NODE BX;  Surgeon: Marshall Skeeter, MD;  Location: [facility];  Service: General;  Laterality: Right;   BREAST REDUCTION WITH MASTOPEXY Left 06/10/2019   Procedure: Left breast mastopexy reduction for symmetry;  Surgeon: Thornell Flirt, DO;  Location: [facility];  Service: Plastics;  Laterality: Left;  total 2.5 hours   CATARACT EXTRACTION W/PHACO Right 02/07/2022   Procedure: CATARACT EXTRACTION PHACO AND INTRAOCULAR LENS PLACEMENT (IOC) RIGHT DIABETIC;  Surgeon: Rosa College, MD;  Location: [facility];  Service: Ophthalmology;  Laterality: Right;  3.60 00:34.7   CATARACT EXTRACTION W/PHACO Left 02/28/2022   Procedure: CATARACT EXTRACTION PHACO AND INTRAOCULAR LENS PLACEMENT (IOC) LEFT  DIABETIC 4.11 00:26.1;  Surgeon: Rosa College, MD;  Location: [facility];  Service: Ophthalmology;  Laterality: Left;   COLONOSCOPY  2015   COLONOSCOPY N/A 01/05/2023   Procedure: COLONOSCOPY;  Surgeon: Selena Daily, MD;  Location: [facility];  Service: Gastroenterology;  Laterality: N/A;   COLONOSCOPY WITH PROPOFOL  N/A 10/29/2019   Procedure: COLONOSCOPY WITH PROPOFOL ;  Surgeon: Marnee Sink, MD;  Location: [facility];  Service: Endoscopy;  Laterality: N/A;   ESOPHAGOGASTRODUODENOSCOPY (EGD) WITH PROPOFOL  N/A 05/24/2019   Procedure: ESOPHAGOGASTRODUODENOSCOPY (EGD) WITH PROPOFOL ;  Surgeon: Marnee Sink, MD;  Location: [facility];  Service: Endoscopy;  Laterality: N/A;   ESOPHAGOGASTRODUODENOSCOPY (EGD) WITH PROPOFOL  N/A 09/06/2022   Procedure: ESOPHAGOGASTRODUODENOSCOPY (EGD) WITH PROPOFOL ;  Surgeon: Marnee Sink, MD;  Location: [facility];  Service: Endoscopy;  Laterality: N/A;   ESOPHAGOGASTRODUODENOSCOPY (EGD) WITH PROPOFOL   01/05/2023   Procedure: ESOPHAGOGASTRODUODENOSCOPY (EGD) WITH PROPOFOL ;  Surgeon: Selena Daily, MD;  Location: [facility];  Service: Gastroenterology;;   ESOPHAGUS SURGERY     GASTROSTOMY N/A 05/25/2023   Procedure: OPEN INSERTION OF GASTROSTOMY TUBE;  Surgeon: Alben Alma, MD;  Location: [facility];  Service: General;  Laterality: N/A;   IR IMAGING GUIDED PORT INSERTION  02/07/2023   KNEE SURGERY Right    LIPOSUCTION WITH LIPOFILLING Right 06/10/2019   Procedure: right breast scar and capsule release with fat grafting;  Surgeon: Thornell Flirt, DO;  Location: [facility];  Service: Plastics;  Laterality: Right;   POLYPECTOMY  01/05/2023   Procedure: POLYPECTOMY;  Surgeon: Selena Daily, MD;  Location: [facility];  Service: Gastroenterology;;   [facility] REMOVAL     PORTACATH PLACEMENT Right 01/17/2018   Procedure: INSERTION PORT-A-CATH- RIGHT;  Surgeon: Marshall Skeeter, MD;  Location: [facility];   Service: General;  Laterality: Right;   PULMONARY THROMBECTOMY Bilateral 05/12/2023   Procedure: PULMONARY THROMBECTOMY;  Surgeon: Celso College, MD;  Location: [facility];  Service: Cardiovascular;  Laterality: Bilateral;   ROBOTIC ADRENALECTOMY Right 12/28/2016   Procedure: ROBOTIC ADRENALECTOMY;  Surgeon: Dustin Gimenez, MD;  Location: [facility];  Service: Urology;  Laterality: Right;   SKIN CANCER EXCISION     THROAT SURGERY  1998   throat cancer    TUBAL LIGATION     VENTRAL HERNIA REPAIR N/A 03/03/2017   Procedure: HERNIA REPAIR VENTRAL ADULT;  Surgeon: Gwyndolyn Lerner, MD;  Location: [facility];  Service: General;  Laterality: N/A;   VIDEO BRONCHOSCOPY WITH ENDOBRONCHIAL ULTRASOUND N/A 01/25/2023   Procedure: VIDEO BRONCHOSCOPY WITH ENDOBRONCHIAL ULTRASOUND;  Surgeon: Erskin Hearing, MD;  Location: [facility];  Service: Thoracic;  Laterality: N/A;   VIDEO BRONCHOSCOPY WITH ENDOBRONCHIAL ULTRASOUND N/A 02/17/2023   Procedure: VIDEO BRONCHOSCOPY WITH ENDOBRONCHIAL ULTRASOUND;  Surgeon: Erskin Hearing, MD;  Location: [facility];  Service: Thoracic;  Laterality: N/A;  Family History  Problem Relation Age of Onset   Diabetes Mother    Heart attack Mother    Cancer Sister        cancer of eyes, spread to lung, other places. died at 12   Cancer Sister 66       unknown primary, spread to bone, brain died in 50's   Cancer Paternal Aunt        unk type   Cancer Paternal Grandmother        type unk   Prostate cancer Neg Hx    Kidney cancer Neg Hx    Bladder Cancer Neg Hx    Breast cancer Neg Hx     Social History:  reports that she quit smoking about 28 years ago. Her smoking use included cigarettes. She started smoking about 58 years ago. She has a 45 pack-year smoking history. She has been exposed to tobacco smoke. She has never used smokeless tobacco. She reports that she does not currently use alcohol after a past usage of about 4.0 standard drinks of alcohol per week. She  reports that she does not use drugs.  Allergies:  Allergies  Allergen Reactions   Sulfa  Antibiotics Other (See Comments)    Mouth sore and raw    Medications reviewed.    ROS Full ROS performed and is otherwise negative other than what is stated in HPI   BP 124/79   Pulse 77   Temp 98.2 F (36.8 C) (Oral)   Ht 5\' 1"  (1.549 m)   Wt 109 lb 3.2 oz (49.5 kg)   SpO2 95%   BMI 20.63 kg/m   Physical Exam    Physical Exam Vitals and nursing note reviewed. Exam conducted with a chaperone present.  Constitutional:      General: She is not in acute distress.    Comments: She is debilitated and chronically ill and malnourished  Pulmonary:     Effort: Pulmonary effort is normal.     Breath sounds: No stridor.  Abdominal:     General: Abdomen is flat. There is no distension.     Palpations: There is no mass.     Tenderness: There is no abdominal tenderness. There is no guarding or rebound.     Hernia: No hernia is present.     Comments: G-tube in place without evidence of infection.  There is excoriation of the skin surrounding it and skin is friable. No hmatomas Musculoskeletal:        General: Normal range of motion.  Skin:    General: Skin is warm and dry.     Capillary Refill: Capillary refill takes less than 2 seconds.  Neurological:     General: No focal deficit present.     Mental Status: She is alert and oriented to person, place, and time.  Psychiatric:        Mood and Affect: Mood normal.        Behavior: Behavior normal.        Thought Content: Thought content normal.        Judgment: Judgment normal.        Assessment/Plan: Ms. Mathurin is 4 months out from open G-tube placement , she has significant chronic pain issues and other chronic cancer issues. GIven need for anticoagulation any excoriation may cause some oozing. We can ask IR to replace g tube. Discussed with her in detail about her disease process.  G tube have chronic problems. D/W pt and family in  detail  about this.   I will be happy to see her on a as needed basis. They are very appreciative.  Please note the spent 30 minutes in this encounter including counseling the patient, personally reviewing imaging studies, placing orders and performing documentation  Evelia Hipp, MD [facility] General Surgeon

## 2023-09-05 MED ORDER — DULOXETINE HCL 60 MG PO CPEP: 60.0000 mg | ORAL_CAPSULE | Freq: Every day | ORAL | 1 refills | Status: DC

## 2023-09-05 NOTE — Telephone Encounter (Signed)
 Patient reports that she is taking Duloxetine  "which is Cymbalta " once daily.

## 2023-09-06 ENCOUNTER — Other Ambulatory Visit: Payer: Self-pay | Admitting: Family Medicine

## 2023-09-06 ENCOUNTER — Other Ambulatory Visit: Payer: Self-pay | Admitting: Oncology

## 2023-09-08 ENCOUNTER — Other Ambulatory Visit: Payer: Self-pay | Admitting: Surgery

## 2023-09-08 ENCOUNTER — Encounter: Payer: Self-pay | Admitting: Oncology

## 2023-09-08 ENCOUNTER — Ambulatory Visit

## 2023-09-08 ENCOUNTER — Telehealth: Payer: Self-pay | Admitting: *Deleted

## 2023-09-08 DIAGNOSIS — K9420 Gastrostomy complication, unspecified: Secondary | ICD-10-CM

## 2023-09-08 NOTE — Telephone Encounter (Signed)
 Called patient to let her know that we have her scheduled for a G Tube exchange on 09/12/23 with moderate sedation. She is aware to be there at 10:30 for a 11:30 appointment at the new entrance to the heart and vascular center at [facility], and NPO 8 hours prior and to have a driver.   Patient also aware that she does not need to hold her Eliquis 

## 2023-09-11 ENCOUNTER — Other Ambulatory Visit: Payer: Self-pay | Admitting: Radiology

## 2023-09-11 DIAGNOSIS — R1310 Dysphagia, unspecified: Secondary | ICD-10-CM

## 2023-09-11 NOTE — Progress Notes (Signed)
 Patient for IR G-tube replacement and upsize on Tues 09/12/23, I called and spoke with the patient on the phone and gave pre-procedure instructions. Pt was made aware to be here at 10:30a, NPO after MN prior to procedure as well as driver post procedure/recovery/discharge. Pt stated understanding.  Called 09/11/23

## 2023-09-11 NOTE — H&P (Shared)
 Chief Complaint: Hx of chronic malnutrition - IR consulted for gastrostomy tube exchange and upsize  Referring Provider(s): Pabon, Marcial Setting, MD   Supervising Physician: Alyssa Jumper  Patient Status: [facility] - Out-pt  History of Present Illness: Denise Watts is a 68 y.o. female with pmhx of metastatic nonsmall cell lung cancer, recent PE/DVT, HLD, COPD, CAD, dCHF, depression with anxiety, A-fib, chronic pain, chronic malnutrition dependent on Gtube. Had gastrostomy tube initially placed with Dr. Dana Duncan with general surgery on 05/25/23 secondary to malnutrition. Per Dr. Oretha Birch note, she has tolerated the tube and tube feeds well. Due to need for anticoagulation in the setting of recent PE/DVT and baseline afib, surgery has consulted IR for Gtube exchange and upsize to minimize bleeding risk.    *** Patient is Full Code  Past Medical History:  Diagnosis Date   Acute pulmonary embolism (HCC) 05/10/2023   Adenomatous colon polyp    Adrenal carcinoma, right (HCC) 12/28/2016   a.) high grade carcinoma with extensive necrosis --> s/p adrenalectomy   Anxiety    Aortic atherosclerosis (HCC)    Arthritis of right knee    Atrial fibrillation and flutter (HCC)    a.) CHA2DS2-VASc = 3 (age, sex, vascular disease history) as of 02/13/2023; b.) cardiac rate/rhythm maintained intrinsically without pharmacological intervention; no chronic OAC (does take low dose ASA)   B12 deficiency    Breast cancer (HCC) 1997   a.) stage IB (cT1a, cN0, cM0, G3, ER+, PR-, HER2-); s/p lumpectomy + XRT   COPD (chronic obstructive pulmonary disease) (HCC)    Coronary artery disease    a.) cCTA 12/09/2021: Ca2+ = 135 (80th %'ile for age/sex/race match contol) --> 25-49% pLAD and RCA distributions   Depression    Diastolic dysfunction    a.) TTE 12/11/2020, no RWMAs, norm RVSF, G1DD; b.) TTE 12/14/2021: EF 55-60, no RWMAs, norm RVSF, G1DD; c.) TTE 01/27/2023: EF 50-55%, no RWMAs, G1DD, mild RAE, mild MR, mod TR   Dry  cough 03/02/2017   Dyspnea    Full dentures    GERD (gastroesophageal reflux disease)    History of hiatal hernia    HLD (hyperlipidemia)    Invasive carcinoma of breast (HCC) 11/15/2017   a.) path (+) for G3, ER -, PR -, HER2/neu - ; s/p lumpectomy 01/17/2018 + 4 cycles TC systemic chemotherapy + XRT   Long term current use of aspirin     Personal history of chemotherapy    Personal history of radiation therapy    Pneumothorax of left lung after biopsy 01/25/2023   a.) hypoxic in PACU requiring re-intubation --> transferred to ICU --> chest tube placed by PCCM   RLS (restless legs syndrome)    a.) on ropinirole    Skin cancer    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   a.) Stage IB (cT1a, cN0, cM0, G3, ER+, PR-, HER2-)    Past Surgical History:  Procedure Laterality Date   BIOPSY  01/05/2023   Procedure: BIOPSY;  Surgeon: Selena Daily, MD;  Location: [facility] ENDOSCOPY;  Service: Gastroenterology;;   BREAST BIOPSY Right 1997   positive   BREAST BIOPSY Right 11/15/2017   INVASIVE MAMMARY CARCINOMA triple negative   BREAST BIOPSY Right 07/20/2020   us  bx, Q marker, neg   BREAST LUMPECTOMY Right 1997   2019 also   BREAST LUMPECTOMY WITH SENTINEL LYMPH NODE BIOPSY Right 12/08/2017   Procedure: BREAST LUMPECTOMY WITH SENTINEL LYMPH NODE BX;  Surgeon: Marshall Skeeter, MD;  Location: [facility]  ORS;  Service: General;  Laterality: Right;   BREAST REDUCTION WITH MASTOPEXY Left 06/10/2019   Procedure: Left breast mastopexy reduction for symmetry;  Surgeon: Thornell Flirt, DO;  Location: [facility];  Service: Plastics;  Laterality: Left;  total 2.5 hours   CATARACT EXTRACTION W/PHACO Right 02/07/2022   Procedure: CATARACT EXTRACTION PHACO AND INTRAOCULAR LENS PLACEMENT (IOC) RIGHT DIABETIC;  Surgeon: Rosa College, MD;  Location: [facility];  Service: Ophthalmology;  Laterality: Right;  3.60 00:34.7   CATARACT EXTRACTION W/PHACO Left 02/28/2022    Procedure: CATARACT EXTRACTION PHACO AND INTRAOCULAR LENS PLACEMENT (IOC) LEFT DIABETIC 4.11 00:26.1;  Surgeon: Rosa College, MD;  Location: [facility];  Service: Ophthalmology;  Laterality: Left;   COLONOSCOPY  2015   COLONOSCOPY N/A 01/05/2023   Procedure: COLONOSCOPY;  Surgeon: Selena Daily, MD;  Location: [facility] ENDOSCOPY;  Service: Gastroenterology;  Laterality: N/A;   COLONOSCOPY WITH PROPOFOL  N/A 10/29/2019   Procedure: COLONOSCOPY WITH PROPOFOL ;  Surgeon: Marnee Sink, MD;  Location: [facility] ENDOSCOPY;  Service: Endoscopy;  Laterality: N/A;   ESOPHAGOGASTRODUODENOSCOPY (EGD) WITH PROPOFOL  N/A 05/24/2019   Procedure: ESOPHAGOGASTRODUODENOSCOPY (EGD) WITH PROPOFOL ;  Surgeon: Marnee Sink, MD;  Location: [facility] ENDOSCOPY;  Service: Endoscopy;  Laterality: N/A;   ESOPHAGOGASTRODUODENOSCOPY (EGD) WITH PROPOFOL  N/A 09/06/2022   Procedure: ESOPHAGOGASTRODUODENOSCOPY (EGD) WITH PROPOFOL ;  Surgeon: Marnee Sink, MD;  Location: [facility] ENDOSCOPY;  Service: Endoscopy;  Laterality: N/A;   ESOPHAGOGASTRODUODENOSCOPY (EGD) WITH PROPOFOL   01/05/2023   Procedure: ESOPHAGOGASTRODUODENOSCOPY (EGD) WITH PROPOFOL ;  Surgeon: Selena Daily, MD;  Location: [facility] ENDOSCOPY;  Service: Gastroenterology;;   ESOPHAGUS SURGERY     GASTROSTOMY N/A 05/25/2023   Procedure: OPEN INSERTION OF GASTROSTOMY TUBE;  Surgeon: Alben Alma, MD;  Location: [facility] ORS;  Service: General;  Laterality: N/A;   IR IMAGING GUIDED PORT INSERTION  02/07/2023   KNEE SURGERY Right    LIPOSUCTION WITH LIPOFILLING Right 06/10/2019   Procedure: right breast scar and capsule release with fat grafting;  Surgeon: Thornell Flirt, DO;  Location: [facility];  Service: Plastics;  Laterality: Right;   POLYPECTOMY  01/05/2023   Procedure: POLYPECTOMY;  Surgeon: Selena Daily, MD;  Location: [facility] ENDOSCOPY;  Service: Gastroenterology;;   [facility] REMOVAL     PORTACATH PLACEMENT Right 01/17/2018   Procedure:  INSERTION PORT-A-CATH- RIGHT;  Surgeon: Marshall Skeeter, MD;  Location: [facility] ORS;  Service: General;  Laterality: Right;   PULMONARY THROMBECTOMY Bilateral 05/12/2023   Procedure: PULMONARY THROMBECTOMY;  Surgeon: Celso College, MD;  Location: [facility] INVASIVE CV LAB;  Service: Cardiovascular;  Laterality: Bilateral;   ROBOTIC ADRENALECTOMY Right 12/28/2016   Procedure: ROBOTIC ADRENALECTOMY;  Surgeon: Dustin Gimenez, MD;  Location: [facility] ORS;  Service: Urology;  Laterality: Right;   SKIN CANCER EXCISION     THROAT SURGERY  1998   throat cancer    TUBAL LIGATION     VENTRAL HERNIA REPAIR N/A 03/03/2017   Procedure: HERNIA REPAIR VENTRAL ADULT;  Surgeon: Gwyndolyn Lerner, MD;  Location: [facility] ORS;  Service: General;  Laterality: N/A;   VIDEO BRONCHOSCOPY WITH ENDOBRONCHIAL ULTRASOUND N/A 01/25/2023   Procedure: VIDEO BRONCHOSCOPY WITH ENDOBRONCHIAL ULTRASOUND;  Surgeon: Erskin Hearing, MD;  Location: [facility] ORS;  Service: Thoracic;  Laterality: N/A;   VIDEO BRONCHOSCOPY WITH ENDOBRONCHIAL ULTRASOUND N/A 02/17/2023   Procedure: VIDEO BRONCHOSCOPY WITH ENDOBRONCHIAL ULTRASOUND;  Surgeon: Erskin Hearing, MD;  Location: [facility] ORS;  Service: Thoracic;  Laterality: N/A;    Allergies: Sulfa  antibiotics  Medications: Prior to Admission medications  Medication Sig Start Date End Date Taking? Authorizing Provider  albuterol  (VENTOLIN  HFA) 108 (90 Base) MCG/ACT inhaler Inhale 2 puffs into the lungs every 6 (six) hours as needed for wheezing or shortness of breath. 07/21/23   Pard[place], Asencion Blacksmith, DO  apixaban  (ELIQUIS ) 5 MG TABS tablet Take 1 tablet (5 mg total) by mouth 2 (two) times daily. 08/30/23   Kasa, Kurian, MD  benzonatate  (TESSALON ) 100 MG capsule Take 2 capsules (200 mg total) by mouth 3 (three) times daily as needed for cough. 06/05/23   Unk Garb, DO  bisacodyl  (DULCOLAX) 10 MG suppository Place 1 suppository (10 mg total) rectally daily as needed for moderate constipation or severe constipation.  06/05/23   Unk Garb, DO  busPIRone  (BUSPAR ) 5 MG tablet TAKE 1 TABLET BY MOUTH 3 TIMES DAILY AS NEEDED 07/31/23   [place], Asencion Blacksmith, DO  cefdinir  (OMNICEF ) 300 MG capsule Take 1 capsule (300 mg total) by mouth 2 (two) times daily. 07/04/23   Carlean Charter, DO  DULoxetine  (CYMBALTA ) 60 MG capsule Take 1 capsule (60 mg total) by mouth daily. 09/05/23   Carlean Charter, DO  fentaNYL  (DURAGESIC ) 12 MCG/HR Place 1 patch onto the skin every 3 (three) days. 08/04/23   Avonne Boettcher, MD  Fluticasone -Umeclidin-Vilant (TRELEGY ELLIPTA ) 100-62.5-25 MCG/ACT AEPB Inhale 1 Act into the lungs daily. 08/30/23   Kasa, Kurian, MD  lactulose , encephalopathy, (ENULOSE ) 10 GM/15ML SOLN PLACE 30 MLS (20G TOTAL) INTO FEEDING TUBE TWICE A DAY 08/16/23   Pardue, Asencion Blacksmith, DO  lidocaine  (LIDODERM ) 5 % Place 1 patch onto the skin daily. Remove & Discard patch within 12 hours or as directed by MD 08/25/23   Avonne Boettcher, MD  meloxicam  (MOBIC ) 7.5 MG tablet TAKE 1 TABLET BY MOUTH DAILY 07/18/23   Carlean Charter, DO  metoprolol  succinate (TOPROL -XL) 25 MG 24 hr tablet TAKE 1 TABLET BY MOUTH DAILY 07/24/23   Pardue, Asencion Blacksmith, DO  montelukast  (SINGULAIR ) 10 MG tablet Take 10 mg by mouth daily. 06/26/23   [provider]  Nutritional Supplements (NUTREN 1.5) LIQD Give 1 carton 4 times a day (8am, noon, 4pm and 8pm). Flush with 60ml of water  before and after each feeding.  Give additional 1 1/2 cups of water  a day via tube or drink orally for adequate hydration. 06/28/23   Avonne Boettcher, MD  omeprazole  (KONVOMEP ) 2 mg/mL SUSP oral suspension Take 20 mLs (40 mg total) by mouth daily. 04/12/23   Borders, Carlene Che, NP  ondansetron  (ZOFRAN ) 4 MG tablet Take 1 tablet (4 mg total) by mouth every 6 (six) hours as needed for nausea or vomiting. 06/05/23 06/04/24  Unk Garb, DO  oxyCODONE  (ROXICODONE ) 5 MG/5ML solution Take 5 mLs (5 mg total) by mouth every 6 (six) hours as needed for severe pain (pain score 7-10). 09/08/23   Avonne Boettcher, MD   predniSONE  (DELTASONE ) 20 MG tablet Take 1 tablet (20 mg total) by mouth daily with breakfast. 7 days 08/30/23   Cleve Dale, MD  rOPINIRole  (REQUIP ) 0.5 MG tablet Take 1 tablet (0.5 mg total) by mouth 3 (three) times daily. 07/04/23   Carlean Charter, DO  rosuvastatin  (CRESTOR ) 40 MG tablet Place 1 tablet (40 mg total) into feeding tube daily. 06/05/23   Unk Garb, DO  senna (SENOKOT) 8.6 MG TABS tablet Place 1 tablet (8.6 mg total) into feeding tube daily. 06/06/23   Unk Garb, DO  simethicone  (MYLICON) 80 MG chewable tablet Place 1 tablet (80 mg  total) into feeding tube 4 (four) times daily. 06/05/23   Unk Garb, DO  sucralfate  (CARAFATE ) 1 GM/10ML suspension TAKE 10 MLS (1G TOTAL) BY MOUTH FOUR TIMES A DAY 09/06/23   Pardue, Asencion Blacksmith, DO  tolterodine  (DETROL  LA) 4 MG 24 hr capsule Take 4 mg by mouth daily. 08/24/23   [provider]  triamcinolone  cream (KENALOG ) 0.1 % Apply 1 Application topically 2 (two) times daily. 04/24/23   [provider]  Water  For Irrigation, Sterile (FREE WATER ) SOLN Place 60 mLs into feeding tube 5 (five) times daily. 06/05/23   Unk Garb, DO     Family History  Problem Relation Age of Onset   Diabetes Mother    Heart attack Mother    Cancer Sister        cancer of eyes, spread to lung, other places. died at 11   Cancer Sister 53       unknown primary, spread to bone, brain died in 6's   Cancer Paternal Aunt        unk type   Cancer Paternal Grandmother        type unk   Prostate cancer Neg Hx    Kidney cancer Neg Hx    Bladder Cancer Neg Hx    Breast cancer Neg Hx     Social History   Socioeconomic History   Marital status: Married    Spouse name: Cortny, Swanigan (Spouse) [phone] ([place])   Number of children: 4   Years of education: Not on file   Highest education level: 10th grade  Occupational History   Occupation: retired  Tobacco Use   Smoking status: Former    Current packs/day: 0.00    Average packs/day: 1.5 packs/day for  30.0 years (45.0 ttl pk-yrs)    Types: Cigarettes    Start date: 05/02/1965    Quit date: 05/03/1995    Years since quitting: 28.3    Passive exposure: Past   Smokeless tobacco: Never  Vaping Use   Vaping status: Never Used  Substance and Sexual Activity   Alcohol use: Not Currently    Alcohol/week: 4.0 standard drinks of alcohol    Types: 4 Shots of liquor per week    Comment: none   Drug use: No   Sexual activity: Not Currently    Birth control/protection: Post-menopausal  Other Topics Concern   Not on file  Social History Narrative   Patient from home with husband.   Social Drivers of Corporate investment banker Strain: Low Risk  (05/19/2023)   Overall Financial Resource Strain (CARDIA)    Difficulty of Paying Living Expenses: Not very hard  Food Insecurity: No Food Insecurity (07/03/2023)   Hunger Vital Sign    Worried About Running Out of Food in the Last Year: Never true    Ran Out of Food in the Last Year: Never true  Transportation Needs: No Transportation Needs (07/03/2023)   PRAPARE - Administrator, Civil Service (Medical): No    Lack of Transportation (Non-Medical): No  Physical Activity: Inactive (05/19/2023)   Exercise Vital Sign    Days of Exercise per Week: 0 days    Minutes of Exercise per Session: 0 min  Stress: Stress Concern Present (05/19/2023)   [employer] of Occupational Health - Occupational Stress Questionnaire    Feeling of Stress : To some extent  Social Connections: Moderately Isolated (05/24/2023)   Social Connection and Isolation Panel [NHANES]    Frequency of Communication with Friends and  Family: More than three times a week    Frequency of Social Gatherings with Friends and Family: More than three times a week    Attends Religious Services: Never    Database administrator or Organizations: No    Attends Banker Meetings: Never    Marital Status: Married     Review of Systems: A 12 point ROS discussed and  pertinent positives are indicated in the HPI above.  All other systems are negative.  Review of Systems  Vital Signs: There were no vitals taken for this visit.  Advance Care Plan: The advanced care place/surrogate decision maker was discussed at the time of visit and the patient did not wish to discuss or was not able to name a surrogate decision maker or provide an advance care plan.  Physical Exam  Imaging: CT CHEST ABDOMEN PELVIS W CONTRAST Result Date: 08/30/2023 CLINICAL DATA:  Non-small cell lung cancer. Evaluate for recurrence. * Tracking Code: BO *. EXAM: CT CHEST, ABDOMEN, AND PELVIS WITH CONTRAST TECHNIQUE: Multidetector CT imaging of the chest, abdomen and pelvis was performed following the standard protocol during bolus administration of intravenous contrast. RADIATION DOSE REDUCTION: This exam was performed according to the departmental dose-optimization program which includes automated exposure control, adjustment of the mA and/or kV according to patient size and/or use of iterative reconstruction technique. CONTRAST:  80mL OMNIPAQUE  IOHEXOL  300 MG/ML  SOLN COMPARISON:  CT abdomen pelvis 05/27/2023, CT chest 05/09/1998 FINDINGS: CT CHEST FINDINGS Cardiovascular: No significant vascular findings. Normal heart size. No pericardial effusion. Port in the anterior chest wall with tip in distal SVC. Mediastinum/Nodes: No axillary or supraclavicular adenopathy. No mediastinal or hilar adenopathy. No pericardial fluid. Esophagus normal. Lungs/Pleura: Bilateral pulmonary nodules again noted. Largest nodule in the LEFT lower lobe measures 13 mm (image 58/4) unchanged from 13 mm on most recent CT chest. Lingular nodule measuring 18 mm (image 62) compares to 16 mm. A band of peripheral nodules in the LEFT upper lobe (image 56/4 ) is new from prior Small nodules in the RIGHT lower lobe are numerous and not changed Example 5 mm nodule on image 94/4 in the RIGHT lower lobe is unchanged. Musculoskeletal:  No aggressive osseous lesion. CT ABDOMEN AND PELVIS FINDINGS Hepatobiliary: No focal hepatic lesion. No biliary ductal dilatation. Gallbladder is normal. Common bile duct is normal. Pancreas: Pancreas is normal. No ductal dilatation. No pancreatic inflammation. Spleen: Normal spleen Adrenals/urinary tract: Adrenal glands and kidneys are normal. The ureters and bladder normal. Stomach/Bowel: Percutaneous gastrostomy tube in stomach. The stomach is fluid-filled. Small bowel normal. No colon abnormality. No mesenteric adenopathy. Vascular/Lymphatic: Abdominal aorta is normal caliber with atherosclerotic calcification. There is no retroperitoneal or periportal lymphadenopathy. No pelvic lymphadenopathy. Reproductive: Uterus and adnexa unremarkable. Other: Trace free fluid the pelvis. Musculoskeletal: No aggressive osseous lesion. IMPRESSION: CHEST: 1. Bilateral pulmonary nodules are unchanged from most recent CT chest. 2. New band of peripheral nodules in the LEFT upper lobe is favored infectious or inflammatory. Potential radiation treatment related. Recommend attention on follow-up. 3. No thoracic adenopathy. PELVIS: 1. No evidence of metastatic disease in the abdomen pelvis. 2. Percutaneous gastrostomy tube in stomach. 3.  Aortic Atherosclerosis (ICD10-I70.0). Electronically Signed   By: Deboraha Fallow M.D.   On: 08/30/2023 16:28    Labs:  CBC: Recent Labs    05/31/23 0456 06/05/23 0730 06/28/23 1254 09/04/23 1318  WBC 3.9* 4.9 6.5 6.6  HGB 10.0* 10.2* 12.9 11.7*  HCT 31.3* 32.9* 40.1 36.3  PLT 197 205  208 184    COAGS: Recent Labs    01/06/23 0821 01/23/23 1317 01/23/23 1317 05/10/23 2038 05/10/23 2252 05/11/23 1903 05/12/23 0148 05/12/23 0838 05/24/23 1903  INR 1.0 1.0  --  1.9*  --   --   --   --  1.1  APTT  --  26   < > >200*   < > 115* 115* 108* 32   < > = values in this interval not displayed.    BMP: Recent Labs    06/02/23 0510 06/05/23 0730 06/28/23 1254  09/04/23 1318  NA 141 141 136 134*  K 4.3 4.7 4.9 4.5  CL 104 103 101 101  CO2 28 30 26 25   GLUCOSE 93 99 96 211*  BUN 12 13 24* 25*  CALCIUM  8.5* 8.5* 9.0 8.8*  CREATININE 0.46 0.47 0.80 0.60  GFRNONAA >60 >60 >60 >60    LIVER FUNCTION TESTS: Recent Labs    05/26/23 0533 06/05/23 0730 06/28/23 1254 09/04/23 1318  BILITOT 0.5 0.5 1.0 0.4  AST 23 25 21  71*  ALT 10 13 11  87*  ALKPHOS 41 49 76 79  PROT 5.0* 5.2* 6.8 6.3*  ALBUMIN  3.0* 2.6* 3.7 3.6    TUMOR MARKERS: No results for input(s): "AFPTM", "CEA", "CA199", "CHROMGRNA" in the last 8760 hours.  Assessment and Plan:  Denise Watts is a 68 y.o. female with pmhx of metastatic nonsmall cell lung cancer, recent PE/DVT, HLD, COPD, CAD, dCHF, depression with anxiety, A-fib, chronic pain, chronic malnutrition dependent on Gtube. Had gastrostomy tube initially placed with Dr. Dana Duncan with general surgery on 05/25/23 secondary to malnutrition. Per Dr. Oretha Birch note, she has tolerated the tube and tube feeds well. Due to need for anticoagulation in the setting of recent PE/DVT and baseline afib, surgery has consulted IR for Gtube exchange and upsize to minimize bleeding risk.   Risks and benefits discussed with the patient including, but not limited to the need for a barium enema during the procedure, bleeding, infection, peritonitis, or damage to adjacent structures. All of the patient's questions were answered, patient is agreeable to proceed. Consent signed and in chart.   Thank you for allowing our service to participate in Denise Watts 's care.  Electronically Signed: Nicolasa Barrett, PA-C   09/11/2023, 11:39 AM      I spent a total of {New ZOXW:[id number]} {New Out-Pt:[id number]}  {Established Out-Pt:[id number]} in face to face in clinical consultation, greater than 50% of which was counseling/coordinating care for gastrostomy tube exchange and upsize.

## 2023-09-12 ENCOUNTER — Encounter: Payer: Self-pay | Admitting: Radiology

## 2023-09-12 ENCOUNTER — Ambulatory Visit
Admission: RE | Admit: 2023-09-12 | Discharge: 2023-09-12 | Disposition: A | Discharge status: Home / Self Care | Source: Ambulatory Visit | Attending: Surgery | Admitting: Surgery

## 2023-09-12 ENCOUNTER — Other Ambulatory Visit: Payer: Self-pay

## 2023-09-12 DIAGNOSIS — R1310 Dysphagia, unspecified: Secondary | ICD-10-CM

## 2023-09-12 DIAGNOSIS — K9420 Gastrostomy complication, unspecified: Secondary | ICD-10-CM | POA: Diagnosis present

## 2023-09-12 HISTORY — PX: IR REPLC GASTRO/COLONIC TUBE PERCUT W/FLUORO: IMG2333

## 2023-09-12 LAB — CBC
HCT: 39.7 % (ref 36.0–46.0)
Hemoglobin: 12.9 g/dL (ref 12.0–15.0)
MCH: 31 pg (ref 26.0–34.0)
MCHC: 32.5 g/dL (ref 30.0–36.0)
MCV: 95.4 fL (ref 80.0–100.0)
Platelets: 188 10*3/uL (ref 150–400)
RBC: 4.16 MIL/uL (ref 3.87–5.11)
RDW: 13.2 % (ref 11.5–15.5)
WBC: 6.8 10*3/uL (ref 4.0–10.5)
nRBC: 0 % (ref 0.0–0.2)

## 2023-09-12 LAB — PROTIME-INR
INR: 1.1 (ref 0.8–1.2)
Prothrombin Time: 14.3 s (ref 11.4–15.2)

## 2023-09-12 MED ORDER — MIDAZOLAM HCL 2 MG/2ML IJ SOLN
INTRAMUSCULAR | Status: AC
  Filled 2023-09-12: qty 2

## 2023-09-12 MED ORDER — IOHEXOL 300 MG/ML  SOLN
10.0000 mL | Freq: Once | INTRAMUSCULAR | Status: AC | PRN
  Administered 2023-09-12: 10 mL

## 2023-09-12 MED ORDER — CEFAZOLIN SODIUM-DEXTROSE 2-4 GM/100ML-% IV SOLN
2.0000 g | INTRAVENOUS | Status: AC
  Administered 2023-09-12: 2 g via INTRAVENOUS

## 2023-09-12 MED ORDER — SODIUM CHLORIDE 0.9 % IV SOLN: INTRAVENOUS | Status: DC

## 2023-09-12 MED ORDER — FENTANYL CITRATE (PF) 100 MCG/2ML IJ SOLN
INTRAMUSCULAR | Status: AC
  Filled 2023-09-12: qty 2

## 2023-09-12 MED ORDER — MIDAZOLAM HCL 2 MG/2ML IJ SOLN
INTRAMUSCULAR | Status: AC | PRN
  Administered 2023-09-12: 1 mg via INTRAVENOUS
  Administered 2023-09-12: .5 mg via INTRAVENOUS

## 2023-09-12 MED ORDER — FENTANYL CITRATE (PF) 100 MCG/2ML IJ SOLN
INTRAMUSCULAR | Status: AC | PRN
  Administered 2023-09-12 (×2): 25 ug via INTRAVENOUS

## 2023-09-12 MED ORDER — STERILE WATER FOR INJECTION IJ SOLN
INTRAMUSCULAR | Status: AC
  Filled 2023-09-12: qty 10

## 2023-09-12 MED ORDER — LIDOCAINE VISCOUS HCL 2 % MT SOLN
OROMUCOSAL | Status: AC
  Filled 2023-09-12: qty 15

## 2023-09-12 MED ORDER — CEFAZOLIN SODIUM-DEXTROSE 2-4 GM/100ML-% IV SOLN
INTRAVENOUS | Status: AC
  Filled 2023-09-12: qty 100

## 2023-09-12 MED ORDER — LIDOCAINE VISCOUS HCL 2 % MT SOLN
15.0000 mL | Freq: Once | OROMUCOSAL | Status: AC
  Administered 2023-09-12: 15 mL via OROMUCOSAL

## 2023-09-12 NOTE — Procedures (Signed)
 Interventional Radiology Procedure Note  Procedure: FLUORO GTUBE EXCHG AND UPSIZE    Complications: None  Estimated Blood Loss:  0  Findings: 18 FR ENTUIT GTUBE    Dayne Even, MD

## 2023-09-13 ENCOUNTER — Other Ambulatory Visit: Payer: Self-pay | Admitting: Family Medicine

## 2023-09-13 DIAGNOSIS — R251 Tremor, unspecified: Secondary | ICD-10-CM

## 2023-09-13 DIAGNOSIS — G2581 Restless legs syndrome: Secondary | ICD-10-CM

## 2023-09-19 ENCOUNTER — Other Ambulatory Visit: Payer: Self-pay | Admitting: Family Medicine

## 2023-09-20 ENCOUNTER — Other Ambulatory Visit: Payer: Self-pay

## 2023-09-20 MED ORDER — OXYCODONE HCL 5 MG/5ML PO SOLN: 5.0000 mg | Freq: Four times a day (QID) | ORAL | 0 refills | Status: DC | PRN

## 2023-09-20 NOTE — Telephone Encounter (Signed)
 Incoming call from patient requesting a refill on her pain medication.  Patient did confirm she already picked up the oxycodone  refill sent on 09/08/23 and is in need of more. Informed script would be sent to [facility].  Patient has no other questions / concerns at this time.

## 2023-09-22 ENCOUNTER — Inpatient Hospital Stay

## 2023-09-22 DIAGNOSIS — E538 Deficiency of other specified B group vitamins: Secondary | ICD-10-CM | POA: Diagnosis not present

## 2023-09-22 DIAGNOSIS — Z23 Encounter for immunization: Secondary | ICD-10-CM

## 2023-09-22 MED ORDER — CYANOCOBALAMIN 1000 MCG/ML IJ SOLN
1000.0000 ug | Freq: Once | INTRAMUSCULAR | Status: AC
  Administered 2023-09-22: 1000 ug via INTRAMUSCULAR
  Filled 2023-09-22: qty 1

## 2023-09-27 ENCOUNTER — Other Ambulatory Visit: Payer: Self-pay | Admitting: Family Medicine

## 2023-09-29 NOTE — Telephone Encounter (Signed)
 Requested medication (s) are due for refill today: historical medication  Requested medication (s) are on the active medication list: yes   Last refill:  06/26/23  Future visit scheduled: no   Notes to clinic:  historical medication. Do you want to order Rx?     Requested Prescriptions  Pending Prescriptions Disp Refills   montelukast  (SINGULAIR ) 10 MG tablet [Pharmacy Med Name: MONTELUKAST  SODIUM 10 MG TAB] 90 tablet     Sig: TAKE 1 TABLET BY MOUTH AT BEDTIME     Pulmonology:  Leukotriene Inhibitors Passed - 09/29/2023  2:35 PM      Passed - Valid encounter within last 12 months    Recent Outpatient Visits           2 months ago Chronic cough   [facility] [place], Asencion Blacksmith, DO   2 months ago Mucopurulent chronic bronchitis [facility])   [facility] [place], Asencion Blacksmith, DO   3 months ago Protein-calorie malnutrition, moderate (HCC)    [facility] Mimi Alt, MD

## 2023-10-03 ENCOUNTER — Other Ambulatory Visit: Payer: Self-pay | Admitting: *Deleted

## 2023-10-03 MED ORDER — OXYCODONE HCL 5 MG/5ML PO SOLN: 5.0000 mg | Freq: Four times a day (QID) | ORAL | 0 refills | Status: DC | PRN

## 2023-10-03 NOTE — Telephone Encounter (Signed)
 Patient called in and says she needs a refill of her oxycodone 

## 2023-10-04 ENCOUNTER — Encounter: Payer: Self-pay | Admitting: Oncology

## 2023-10-04 NOTE — Progress Notes (Signed)
 After multiple attempts, we have been unable to reach this patient to enroll in services.  Referral closed on 10/02/2023.  [facility] remains available should the patient express interest in the future.

## 2023-10-06 ENCOUNTER — Other Ambulatory Visit: Payer: Self-pay | Admitting: Family Medicine

## 2023-10-09 NOTE — Telephone Encounter (Signed)
 Too soon for refill, last refill 09/06/23 w/ additional RF.  Requested Prescriptions  Pending Prescriptions Disp Refills   sucralfate  (CARAFATE ) 1 GM/10ML suspension [Pharmacy Med Name: SUCRALFATE  1 GM/10ML SUSP ML] 414 mL     Sig: TAKE 10 ML (1 GRAM TOTAL) BY MOUTH 4 TIMES DAILY     Gastroenterology: Antiacids Passed - 10/09/2023  8:29 AM      Passed - Valid encounter within last 12 months    Recent Outpatient Visits           2 months ago Chronic cough   [facility] [place], Asencion Blacksmith, DO   3 months ago Mucopurulent chronic bronchitis [facility])   [facility] [place], Asencion Blacksmith, DO   3 months ago Protein-calorie malnutrition, moderate (HCC)   [facility] Mimi Alt, MD

## 2023-10-10 ENCOUNTER — Telehealth: Payer: Self-pay | Admitting: Family Medicine

## 2023-10-10 NOTE — Telephone Encounter (Unsigned)
 Copied from CRM [id number]. Topic: Clinical - Medication Refill >> Oct 10, 2023  4:18 PM Rachelle R wrote: Medication: sucralfate  (CARAFATE ) 1 GM/10ML suspension (Previous refill request was declined but only a 10 day supply)  Has the patient contacted their pharmacy? Yes, call dr  This is the patient's preferred pharmacy:   [facility] - [place], [place] - [address] [place] [address] Phone: [phone] Fax: [phone]  Is this the correct pharmacy for this prescription? Yes If no, delete pharmacy and type the correct one.   Has the prescription been filled recently? No  Is the patient out of the medication? Yes  Has the patient been seen for an appointment in the last year OR does the patient have an upcoming appointment? Yes  Can we respond through MyChart? Yes  Agent: Please be advised that Rx refills may take up to 3 business days. We ask that you follow-up with your pharmacy.

## 2023-10-12 MED ORDER — SUCRALFATE 1 GM/10ML PO SUSP: 1.0000 g | Freq: Four times a day (QID) | ORAL | 2 refills | Status: DC

## 2023-10-12 NOTE — Telephone Encounter (Signed)
 Requested Prescriptions  Pending Prescriptions Disp Refills   sucralfate  (CARAFATE ) 1 GM/10ML suspension 1200 mL 2    Sig: Place 10 mLs (1 g total) into feeding tube 4 (four) times daily.     Gastroenterology: Antiacids Passed - 10/12/2023 11:03 AM      Passed - Valid encounter within last 12 months    Recent Outpatient Visits           2 months ago Chronic cough   [place] [facility] [place], Asencion Blacksmith, DO   3 months ago Mucopurulent chronic bronchitis [facility])   [place] [facility] [place], Asencion Blacksmith, DO   3 months ago Protein-calorie malnutrition, moderate [facility])   [place] [facility] Mimi Alt, MD

## 2023-10-13 ENCOUNTER — Other Ambulatory Visit: Payer: Self-pay

## 2023-10-13 ENCOUNTER — Telehealth: Payer: Self-pay | Admitting: *Deleted

## 2023-10-13 MED ORDER — OXYCODONE HCL 5 MG/5ML PO SOLN: 5.0000 mg | Freq: Four times a day (QID) | ORAL | 0 refills | Status: DC | PRN

## 2023-10-13 NOTE — Telephone Encounter (Signed)
 Patient called in and you can barely hear her and she says she wants refills .  I called called the patient's phone no answer.  And no way to get voicemail because it has not been set up Called 1 more time still did not get it but Dr. Randy Buttery did send in the oxycodone  liquid

## 2023-10-20 ENCOUNTER — Other Ambulatory Visit: Payer: Self-pay | Admitting: Family Medicine

## 2023-10-23 ENCOUNTER — Inpatient Hospital Stay: Attending: Oncology

## 2023-10-23 DIAGNOSIS — Z23 Encounter for immunization: Secondary | ICD-10-CM

## 2023-10-23 DIAGNOSIS — E538 Deficiency of other specified B group vitamins: Secondary | ICD-10-CM | POA: Diagnosis present

## 2023-10-23 MED ORDER — CYANOCOBALAMIN 1000 MCG/ML IJ SOLN
1000.0000 ug | INTRAMUSCULAR | Status: DC
  Administered 2023-10-23: 1000 ug via INTRAMUSCULAR
  Filled 2023-10-23: qty 1

## 2023-10-27 ENCOUNTER — Other Ambulatory Visit: Payer: Self-pay | Admitting: *Deleted

## 2023-10-27 MED ORDER — OXYCODONE HCL 5 MG/5ML PO SOLN: 5.0000 mg | Freq: Four times a day (QID) | ORAL | 0 refills | Status: DC | PRN

## 2023-10-27 NOTE — Telephone Encounter (Signed)
 Patient called and stated that she needs to oxycodone  today. Script pended.

## 2023-10-30 ENCOUNTER — Telehealth: Payer: Self-pay

## 2023-10-30 NOTE — Telephone Encounter (Signed)
 Two voicemails received from patient on 10/27/23, one at 12:05PM and the other at 3:00PM requesting a refill on her pain medications.  Looks like script was filled on 10/27/23; [facility] spoke with Amy who confirmed script was received on 10/27/23.

## 2023-11-07 ENCOUNTER — Other Ambulatory Visit: Payer: Self-pay

## 2023-11-07 ENCOUNTER — Other Ambulatory Visit: Payer: Self-pay | Admitting: Oncology

## 2023-11-07 ENCOUNTER — Telehealth: Payer: Self-pay | Admitting: Family Medicine

## 2023-11-07 DIAGNOSIS — I25118 Atherosclerotic heart disease of native coronary artery with other forms of angina pectoris: Secondary | ICD-10-CM

## 2023-11-07 MED ORDER — ROSUVASTATIN CALCIUM 40 MG PO TABS
40.0000 mg | ORAL_TABLET | Freq: Every day | ORAL | 0 refills | Status: DC
Start: 2023-11-07 — End: 2023-11-15

## 2023-11-07 NOTE — Telephone Encounter (Signed)
 [facility] faxed refill request for the following medications:   rosuvastatin  (CRESTOR ) 40 MG tablet     Please advise.

## 2023-11-07 NOTE — Telephone Encounter (Signed)
 Refill sent to pharmacy.

## 2023-11-08 ENCOUNTER — Ambulatory Visit: Admitting: Radiation Oncology

## 2023-11-08 ENCOUNTER — Inpatient Hospital Stay: Attending: Oncology

## 2023-11-08 DIAGNOSIS — E538 Deficiency of other specified B group vitamins: Secondary | ICD-10-CM | POA: Insufficient documentation

## 2023-11-08 DIAGNOSIS — Z8521 Personal history of malignant neoplasm of larynx: Secondary | ICD-10-CM | POA: Insufficient documentation

## 2023-11-08 DIAGNOSIS — Z853 Personal history of malignant neoplasm of breast: Secondary | ICD-10-CM | POA: Insufficient documentation

## 2023-11-08 DIAGNOSIS — C3490 Malignant neoplasm of unspecified part of unspecified bronchus or lung: Secondary | ICD-10-CM | POA: Insufficient documentation

## 2023-11-08 NOTE — Progress Notes (Signed)
 Nutrition  Patient did not show up for nutrition visit today.  Rescheduled for 7/25 following injection  Alishba Naples B. Dasie SOLON, CSO, LDN Registered Dietitian [phone]

## 2023-11-10 ENCOUNTER — Encounter: Payer: Self-pay | Admitting: Oncology

## 2023-11-10 ENCOUNTER — Other Ambulatory Visit: Payer: Self-pay | Admitting: Nurse Practitioner

## 2023-11-10 MED ORDER — OXYCODONE HCL 5 MG/5ML PO SOLN: 5.0000 mg | Freq: Three times a day (TID) | ORAL | 0 refills | Status: DC | PRN

## 2023-11-10 NOTE — Progress Notes (Signed)
 Fentanyl  refilled. Reduced oxycodone  dosing to every 8 hours. Follow up with Dr Melanee.

## 2023-11-13 ENCOUNTER — Telehealth: Payer: Self-pay

## 2023-11-13 NOTE — Telephone Encounter (Signed)
 Voicemail received from patient on Friday 11/10/23 at 11:30 am requesting a refill on pain medication.  Roxicodone  already sent and received by pharmacy on 11/10/23 at 3:18PM.  No further follow up needed.

## 2023-11-15 ENCOUNTER — Other Ambulatory Visit: Payer: Self-pay

## 2023-11-15 ENCOUNTER — Telehealth: Payer: Self-pay | Admitting: Family Medicine

## 2023-11-15 DIAGNOSIS — I25118 Atherosclerotic heart disease of native coronary artery with other forms of angina pectoris: Secondary | ICD-10-CM

## 2023-11-15 MED ORDER — ROSUVASTATIN CALCIUM 40 MG PO TABS: 40.0000 mg | ORAL_TABLET | Freq: Every day | ORAL | 0 refills | Status: DC

## 2023-11-15 NOTE — Telephone Encounter (Signed)
 Patient's prescription for Rosuvastatin  40 mg. was printed and never sent into [facility].   Please send rx to pharmacy please!

## 2023-11-15 NOTE — Telephone Encounter (Signed)
 LVM to patient that the medication was sent to pharmacy, it was not printed so I had to reorder.

## 2023-11-21 ENCOUNTER — Other Ambulatory Visit: Payer: Self-pay | Admitting: *Deleted

## 2023-11-21 NOTE — Telephone Encounter (Signed)
 Patient said she only has enough of oxycodone  for 1 more day.  Checked it in she would be out tomorrow

## 2023-11-22 ENCOUNTER — Ambulatory Visit: Admitting: Internal Medicine

## 2023-11-22 ENCOUNTER — Encounter: Payer: Self-pay | Admitting: Oncology

## 2023-11-22 MED ORDER — OXYCODONE HCL 5 MG/5ML PO SOLN: 5.0000 mg | Freq: Three times a day (TID) | ORAL | 0 refills | Status: DC | PRN

## 2023-11-23 ENCOUNTER — Telehealth: Payer: Self-pay | Admitting: *Deleted

## 2023-11-23 ENCOUNTER — Other Ambulatory Visit: Payer: Self-pay | Admitting: Family Medicine

## 2023-11-23 DIAGNOSIS — G2581 Restless legs syndrome: Secondary | ICD-10-CM

## 2023-11-23 DIAGNOSIS — R52 Pain, unspecified: Secondary | ICD-10-CM

## 2023-11-23 DIAGNOSIS — F418 Other specified anxiety disorders: Secondary | ICD-10-CM

## 2023-11-23 DIAGNOSIS — R251 Tremor, unspecified: Secondary | ICD-10-CM

## 2023-11-23 NOTE — Telephone Encounter (Signed)
 Patient calling saying that she went to get her liquid oxycodone  they said that they did not have it at the pharmacy.  The order was done and sent to the pharmacy/but Denise Watts was told by Dr. Melanee that she is going to start  adjusting to decrease some of this medication.  He was told that she will be getting it every 8 hours as needed and based on the last time that she got the medication it would last all the way till July 25 and that is why the patient cannot get it today because trying to decrease and get her off of the pain medicine.  I called the patient and let her know that it is going to be dropped down to every 8 hours and Dr. Melanee has said that and she would like to start adjusting you so that you do not have all of that medication all the time, patient said thank you for giving that information to her and she says that the medicine really helps when she takes it

## 2023-11-24 ENCOUNTER — Inpatient Hospital Stay

## 2023-11-24 ENCOUNTER — Inpatient Hospital Stay: Admitting: Hospice and Palliative Medicine

## 2023-11-24 DIAGNOSIS — E538 Deficiency of other specified B group vitamins: Secondary | ICD-10-CM | POA: Diagnosis present

## 2023-11-24 DIAGNOSIS — Z923 Personal history of irradiation: Secondary | ICD-10-CM | POA: Diagnosis not present

## 2023-11-24 DIAGNOSIS — Z9221 Personal history of antineoplastic chemotherapy: Secondary | ICD-10-CM

## 2023-11-24 DIAGNOSIS — C3492 Malignant neoplasm of unspecified part of left bronchus or lung: Secondary | ICD-10-CM | POA: Diagnosis not present

## 2023-11-24 DIAGNOSIS — R21 Rash and other nonspecific skin eruption: Secondary | ICD-10-CM

## 2023-11-24 DIAGNOSIS — T85848D Pain due to other internal prosthetic devices, implants and grafts, subsequent encounter: Secondary | ICD-10-CM

## 2023-11-24 DIAGNOSIS — C3490 Malignant neoplasm of unspecified part of unspecified bronchus or lung: Secondary | ICD-10-CM | POA: Diagnosis not present

## 2023-11-24 DIAGNOSIS — Z23 Encounter for immunization: Secondary | ICD-10-CM

## 2023-11-24 DIAGNOSIS — Z853 Personal history of malignant neoplasm of breast: Secondary | ICD-10-CM | POA: Diagnosis not present

## 2023-11-24 DIAGNOSIS — Z8521 Personal history of malignant neoplasm of larynx: Secondary | ICD-10-CM | POA: Diagnosis not present

## 2023-11-24 MED ORDER — CYANOCOBALAMIN 1000 MCG/ML IJ SOLN
1000.0000 ug | INTRAMUSCULAR | Status: DC
  Administered 2023-11-24: 1000 ug via INTRAMUSCULAR
  Filled 2023-11-24: qty 1

## 2023-11-24 NOTE — Progress Notes (Signed)
 Nutrition Follow-up:  Patient with non small cell lung cancer.  PEG placed on 05/22/22 during hospital admission because of dysphagia, aspiration pneumonia.  PEG replaced on 2/21 in ED (14 fr) and replaced by IR on 09/12/23 (18 Fr).  Currently under surveillance.  Met with patient after injection.  Patient reports that she is giving 3-4 cartons of nutren 1.5 via feeding tube each day.  Has been eating some by mouth but all foods pureed in blender.  Gives medication via feeding tube because she says she can't swallow them.  Flushing tube with 60ml of water  before and after each feeding.  Giving less water  with medications.  Says sometimes feeding tube leaks at insertion site (tube up sized).  RD looked at feeding tube and note plastic bumper rubbing on skin (red, excoriated).      Medications: reviewed  Labs: no new  Anthropometrics:   Weight 106 lb 4 oz today  107 lb 2 oz on 4/25 109 lb on 3/26 111 lb on 2/21 117 lb on 2/3 119 lb on 4.3 oz on 05/30/23   Estimated Energy Needs  Kcals: 1500-1750 Protein: 50-60 g Fluid: 1500-1750 ml  NUTRITION DIAGNOSIS: Inadequate oral intake continues relying on feeding tube   INTERVENTION:  Spoke with NP and able to see patient today to look at PEG tube site RD applied split gauze under bumper to relive pressure Continue nutren 1.5, 3-4 cartons per day via feeding tube.   Patient concerned about feeding tube removal as unable to swallow medications orally at this time.       MONITORING, EVALUATION, GOAL: weight trends, tube feeding   NEXT VISIT: Sept 5 after MD evaluation  Luisangel Wainright B. Dasie SOLON, CSO, LDN Registered Dietitian [phone]

## 2023-11-24 NOTE — Progress Notes (Signed)
 Symptom Management Clinic [facility] [phone]) [phone] [phone]  Patient Care Team: [name], DO as PCP - General (Family Medicine) Darliss Rogue, MD as PCP - Cardiology (Cardiology) Melanee Annah BROCKS, MD as Consulting Physician (Oncology) Jordis Laneta FALCON, MD as Consulting Physician (General Surgery) Dasher, Alm LABOR, MD (Dermatology) Scheeler, Donnice PARAS, PA-C as Physician Assistant (Plastic Surgery) Dorothyann Drivers, MD as Attending Physician (Emergency Medicine) Ferrel, Lionel Sotero RIGGERS (Neurology) French Mering, MD as Referring Physician (Internal Medicine) Shellia Oh, MD (Inactive) as Consulting Physician (Pulmonary Disease) Pa, [facility] (Optometry) Verdene Gills, RN as Oncology Nurse Navigator Lenn Aran, MD as Consulting Physician (Radiation Oncology) Moises Reusing, RN as [facility], [place], LCSW as [facility], [place], [place]   NAME OF PATIENT: Denise Watts  [id number]  [birth year]   DATE OF VISIT: 11/24/23  REASON FOR CONSULT: VERNA DESROCHER is a 68 y.o. 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  Patient was seen in May by Dr. Jordis with complaint of leakage around the PEG.  At that time, IR upsized her PEG.  Patient continues to endorse some leakage around the PEG but feels some discomfort in the inferior aspect around the PEG, but she attributes to pressure.  Denies any neurologic complaints. Denies recent fevers or illnesses. Denies any easy bleeding or bruising. Denies urinary complaints. Patient offers no further specific complaints today.   PAST  MEDICAL HISTORY: Past Medical History:  Diagnosis Date   Acute pulmonary embolism (HCC) 05/10/2023   Adenomatous colon polyp    Adrenal carcinoma, right (HCC) 12/28/2016   a.) high grade carcinoma with extensive necrosis --> s/p adrenalectomy   Anxiety    Aortic atherosclerosis (HCC)    Arthritis of right knee    Atrial fibrillation and flutter (HCC)    a.) CHA2DS2-VASc = 3 (age, sex, vascular disease history) as of 02/13/2023; b.) cardiac rate/rhythm maintained intrinsically without pharmacological intervention; no chronic OAC (does take low dose ASA)   B12 deficiency    Breast cancer (HCC) 1997   a.) stage IB (cT1a, cN0, cM0, G3, ER+, PR-, HER2-); s/p lumpectomy + XRT   COPD (chronic obstructive pulmonary disease) (HCC)    Coronary artery disease    a.) cCTA 12/09/2021: Ca2+ = 135 (80th %'ile for age/sex/race match contol) --> 25-49% pLAD and RCA distributions   Depression    Diastolic dysfunction    a.) TTE 12/11/2020, no RWMAs, norm RVSF, G1DD; b.) TTE 12/14/2021: EF 55-60, no RWMAs, norm RVSF, G1DD; c.) TTE 01/27/2023: EF 50-55%, no RWMAs, G1DD, mild RAE, mild MR, mod TR   Dry cough 03/02/2017   Dyspnea    Full dentures    GERD (gastroesophageal reflux disease)    History of hiatal hernia    HLD (hyperlipidemia)    Invasive carcinoma of breast (HCC) 11/15/2017   a.) path (+) for G3, ER -, PR -, HER2/neu - ; s/p lumpectomy 01/17/2018 + 4 cycles TC systemic chemotherapy + XRT   Long term current use of aspirin     Personal history of chemotherapy    Personal history of radiation therapy    Pneumothorax of left lung after biopsy 01/25/2023   a.) hypoxic  in PACU requiring re-intubation --> transferred to ICU --> chest tube placed by PCCM   RLS (restless legs syndrome)    a.) on ropinirole    Skin cancer    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   a.) Stage IB (cT1a, cN0, cM0, G3, ER+, PR-, HER2-)    PAST SURGICAL HISTORY:  Past Surgical History:  Procedure  Laterality Date   BIOPSY  01/05/2023   Procedure: BIOPSY;  Surgeon: Unk Corinn Skiff, MD;  Location: [facility] ENDOSCOPY;  Service: Gastroenterology;;   BREAST BIOPSY Right 1997   positive   BREAST BIOPSY Right 11/15/2017   INVASIVE MAMMARY CARCINOMA triple negative   BREAST BIOPSY Right 07/20/2020   us  bx, Q marker, neg   BREAST LUMPECTOMY Right 1997   2019 also   BREAST LUMPECTOMY WITH SENTINEL LYMPH NODE BIOPSY Right 12/08/2017   Procedure: BREAST LUMPECTOMY WITH SENTINEL LYMPH NODE BX;  Surgeon: Dessa Reyes ORN, MD;  Location: [facility] ORS;  Service: General;  Laterality: Right;   BREAST REDUCTION WITH MASTOPEXY Left 06/10/2019   Procedure: Left breast mastopexy reduction for symmetry;  Surgeon: Lowery Estefana RAMAN, DO;  Location: [facility];  Service: Plastics;  Laterality: Left;  total 2.5 hours   CATARACT EXTRACTION W/PHACO Right 02/07/2022   Procedure: CATARACT EXTRACTION PHACO AND INTRAOCULAR LENS PLACEMENT (IOC) RIGHT DIABETIC;  Surgeon: Myrna Adine Anes, MD;  Location: [facility];  Service: Ophthalmology;  Laterality: Right;  3.60 00:34.7   CATARACT EXTRACTION W/PHACO Left 02/28/2022   Procedure: CATARACT EXTRACTION PHACO AND INTRAOCULAR LENS PLACEMENT (IOC) LEFT DIABETIC 4.11 00:26.1;  Surgeon: Myrna Adine Anes, MD;  Location: [facility];  Service: Ophthalmology;  Laterality: Left;   COLONOSCOPY  2015   COLONOSCOPY N/A 01/05/2023   Procedure: COLONOSCOPY;  Surgeon: Unk Corinn Skiff, MD;  Location: [facility];  Service: Gastroenterology;  Laterality: N/A;   COLONOSCOPY WITH PROPOFOL  N/A 10/29/2019   Procedure: COLONOSCOPY WITH PROPOFOL ;  Surgeon: Jinny Carmine, MD;  Location: [facility];  Service: Endoscopy;  Laterality: N/A;   ESOPHAGOGASTRODUODENOSCOPY (EGD) WITH PROPOFOL  N/A 05/24/2019   Procedure: ESOPHAGOGASTRODUODENOSCOPY (EGD) WITH PROPOFOL ;  Surgeon: Jinny Carmine, MD;  Location: [facility];  Service: Endoscopy;  Laterality: N/A;    ESOPHAGOGASTRODUODENOSCOPY (EGD) WITH PROPOFOL  N/A 09/06/2022   Procedure: ESOPHAGOGASTRODUODENOSCOPY (EGD) WITH PROPOFOL ;  Surgeon: Jinny Carmine, MD;  Location: [facility];  Service: Endoscopy;  Laterality: N/A;   ESOPHAGOGASTRODUODENOSCOPY (EGD) WITH PROPOFOL   01/05/2023   Procedure: ESOPHAGOGASTRODUODENOSCOPY (EGD) WITH PROPOFOL ;  Surgeon: Unk Corinn Skiff, MD;  Location: [facility];  Service: Gastroenterology;;   ESOPHAGUS SURGERY     GASTROSTOMY N/A 05/25/2023   Procedure: OPEN INSERTION OF GASTROSTOMY TUBE;  Surgeon: Jordis Laneta FALCON, MD;  Location: [facility];  Service: General;  Laterality: N/A;   IR IMAGING GUIDED PORT INSERTION  02/07/2023   IR REPLC GASTRO/COLONIC TUBE PERCUT W/FLUORO  09/12/2023   KNEE SURGERY Right    LIPOSUCTION WITH LIPOFILLING Right 06/10/2019   Procedure: right breast scar and capsule release with fat grafting;  Surgeon: Lowery Estefana RAMAN, DO;  Location: [facility];  Service: Plastics;  Laterality: Right;   POLYPECTOMY  01/05/2023   Procedure: POLYPECTOMY;  Surgeon: Unk Corinn Skiff, MD;  Location: [facility] END[facility];  Service: Gastroenterology;;   [facility] REMOVAL     PORTACATH PLACEMENT Right 01/17/2018   Procedure: INSERTION PORT-A-CATH- RIGHT;  Surgeon: Dessa Reyes ORN, MD;  Location: [facility];  Service: General;  Laterality: Right;   PULMONARY THROMBECTOMY Bilateral 05/12/2023   Procedure: PULMONARY  THROMBECTOMY;  Surgeon: Marea Selinda RAMAN, MD;  Location: [facility];  Service: Cardiovascular;  Laterality: Bilateral;   ROBOTIC ADRENALECTOMY Right 12/28/2016   Procedure: ROBOTIC ADRENALECTOMY;  Surgeon: Penne Knee, MD;  Location: [facility];  Service: Urology;  Laterality: Right;   SKIN CANCER EXCISION     THROAT SURGERY  1998   throat cancer    TUBAL LIGATION     VENTRAL HERNIA REPAIR N/A 03/03/2017   Procedure: HERNIA REPAIR VENTRAL ADULT;  Surgeon: Shelva Dunnings, MD;  Location: [facility];  Service: General;   Laterality: N/A;   VIDEO BRONCHOSCOPY WITH ENDOBRONCHIAL ULTRASOUND N/A 01/25/2023   Procedure: VIDEO BRONCHOSCOPY WITH ENDOBRONCHIAL ULTRASOUND;  Surgeon: Parris Manna, MD;  Location: [facility];  Service: Thoracic;  Laterality: N/A;   VIDEO BRONCHOSCOPY WITH ENDOBRONCHIAL ULTRASOUND N/A 02/17/2023   Procedure: VIDEO BRONCHOSCOPY WITH ENDOBRONCHIAL ULTRASOUND;  Surgeon: Parris Manna, MD;  Location: [facility];  Service: Thoracic;  Laterality: N/A;    HEMATOLOGY/ONCOLOGY HISTORY:  Oncology History Overview Note  1. Carcinoma of breast, T1, N0, M0 tumor diagnosis.  In January of 1997 2. Carcinoma of the vocal cord T1, N0, M0 tumor.  Status postradiation therapy 3. Abnormal CT scan of the chest (December, 2015) 4. Repeat CT scan of chest shows stable nodule (July, 2016) 5. Neutropenia with normal hemoglobin and normal platelet count (July, 2016)   Cancer of vocal cord [facility]) (Resolved)  11/10/2014 Initial Diagnosis   Cancer of vocal cord   Malignant neoplasm of lower-outer quadrant of right breast of female, estrogen receptor negative (HCC)  11/24/2017 Initial Diagnosis   Malignant neoplasm of lower-outer quadrant of right breast of female, estrogen receptor negative (HCC)   01/12/2018 Cancer Staging   Staging form: Breast, AJCC 8th Edition - Clinical stage from 01/12/2018: Stage IB (cT1a, cN0, cM0, G3, ER+, PR-, HER2-) - Signed by Melanee Annah BROCKS, MD on 01/12/2018   01/30/2018 - 04/26/2018 Chemotherapy   The patient had dexamethasone  (DECADRON ) 4 MG tablet, 8 mg, Oral, 2 times daily, 1 of 1 cycle, Start date: 01/12/2018, End date: 08/03/2018 palonosetron  (ALOXI ) injection 0.25 mg, 0.25 mg, Intravenous,  Once, 4 of 4 cycles Administration: 0.25 mg (01/30/2018), 0.25 mg (02/27/2018), 0.25 mg (03/28/2018), 0.25 mg (04/26/2018) pegfilgrastim  (NEULASTA  ONPRO KIT) injection 6 mg, 6 mg, Subcutaneous, Once, 4 of 4 cycles Administration: 6 mg (01/30/2018), 6 mg (02/27/2018), 6 mg (03/28/2018), 6 mg  (04/26/2018) cyclophosphamide  (CYTOXAN ) 1,000 mg in sodium chloride  0.9 % 250 mL chemo infusion, 600 mg/m2 = 1,000 mg, Intravenous,  Once, 4 of 4 cycles Administration: 1,000 mg (01/30/2018), 1,000 mg (02/27/2018), 1,000 mg (03/28/2018), 1,000 mg (04/26/2018) DOCEtaxel  (TAXOTERE ) 130 mg in sodium chloride  0.9 % 250 mL chemo infusion, 75 mg/m2 = 130 mg, Intravenous,  Once, 4 of 4 cycles Dose modification: 60 mg/m2 (original dose 75 mg/m2, Cycle 2, Reason: Dose not tolerated) Administration: 130 mg (01/30/2018), 100 mg (02/27/2018), 100 mg (03/28/2018), 100 mg (04/26/2018)  for chemotherapy treatment.    Non-small cell lung cancer (HCC)  03/09/2023 Initial Diagnosis   Non-small cell lung cancer (HCC)   03/20/2023 -  Chemotherapy   Patient is on Treatment Plan : LUNG Carboplatin  + Paclitaxel  q7d       ALLERGIES:  is allergic to sulfa  antibiotics.  MEDICATIONS:  Current Outpatient Medications  Medication Sig Dispense Refill   albuterol  (VENTOLIN  HFA) 108 (90 Base) MCG/ACT inhaler Inhale 2 puffs into the lungs every 6 (six) hours as needed for wheezing or shortness of breath. 51 each 2  apixaban  (ELIQUIS ) 5 MG TABS tablet Take 1 tablet (5 mg total) by mouth 2 (two) times daily. 60 tablet 3   benzonatate  (TESSALON ) 100 MG capsule Take 2 capsules (200 mg total) by mouth 3 (three) times daily as needed for cough.     bisacodyl  (DULCOLAX) 10 MG suppository Place 1 suppository (10 mg total) rectally daily as needed for moderate constipation or severe constipation.     busPIRone  (BUSPAR ) 5 MG tablet TAKE 1 TABLET BY MOUTH 3 TIMES DAILY AS NEEDED 270 tablet 0   cefdinir  (OMNICEF ) 300 MG capsule Take 1 capsule (300 mg total) by mouth 2 (two) times daily. 20 capsule 0   DULoxetine  (CYMBALTA ) 60 MG capsule Take 1 capsule (60 mg total) by mouth daily. 90 capsule 1   fentaNYL  (DURAGESIC ) 12 MCG/HR Place 1 patch onto the skin every 3 (three) days. 10 patch 0   Fluticasone -Umeclidin-Vilant (TRELEGY ELLIPTA )  100-62.5-25 MCG/ACT AEPB Inhale 1 Act into the lungs daily. 1 each 5   lactulose , encephalopathy, (CHRONULAC ) 10 GM/15ML SOLN PLACE 30 ML (20 GRAMS TOTAL) INTO FEEDING TUBE TWICE DAILY 946 mL 0   lidocaine  (LIDODERM ) 5 % Place 1 patch onto the skin daily. Remove & Discard patch within 12 hours or as directed by MD 30 patch 2   meloxicam  (MOBIC ) 7.5 MG tablet TAKE 1 TABLET BY MOUTH DAILY 90 tablet 1   metoprolol  succinate (TOPROL -XL) 25 MG 24 hr tablet TAKE 1 TABLET BY MOUTH DAILY 30 tablet 4   montelukast  (SINGULAIR ) 10 MG tablet TAKE 1 TABLET BY MOUTH AT BEDTIME 90 tablet 0   Nutritional Supplements (NUTREN 1.5) LIQD Give 1 carton 4 times a day (8am, noon, 4pm and 8pm). Flush with 60ml of water  before and after each feeding.  Give additional 1 1/2 cups of water  a day via tube or drink orally for adequate hydration.     omeprazole  (KONVOMEP ) 2 mg/mL SUSP oral suspension Take 20 mLs (40 mg total) by mouth daily. 240 mL 1   ondansetron  (ZOFRAN ) 4 MG tablet Take 1 tablet (4 mg total) by mouth every 6 (six) hours as needed for nausea or vomiting.     oxyCODONE  (ROXICODONE ) 5 MG/5ML solution Take 5 mLs (5 mg total) by mouth every 8 (eight) hours as needed for severe pain (pain score 7-10). 200 mL 0   predniSONE  (DELTASONE ) 20 MG tablet Take 1 tablet (20 mg total) by mouth daily with breakfast. 7 days 7 tablet 1   rOPINIRole  (REQUIP ) 0.5 MG tablet TAKE 1 TABLET BY MOUTH 3 TIMES DAILY 90 tablet 1   rosuvastatin  (CRESTOR ) 40 MG tablet Place 1 tablet (40 mg total) into feeding tube daily. 90 tablet 0   senna (SENOKOT) 8.6 MG TABS tablet Place 1 tablet (8.6 mg total) into feeding tube daily.     simethicone  (MYLICON) 80 MG chewable tablet Place 1 tablet (80 mg total) into feeding tube 4 (four) times daily.     sucralfate  (CARAFATE ) 1 GM/10ML suspension Place 10 mLs (1 g total) into feeding tube 4 (four) times daily. 1200 mL 2   tolterodine  (DETROL  LA) 4 MG 24 hr capsule Take 4 mg by mouth daily.      triamcinolone  cream (KENALOG ) 0.1 % Apply 1 Application topically 2 (two) times daily.     Water  For Irrigation, Sterile (FREE WATER ) SOLN Place 60 mLs into feeding tube 5 (five) times daily.     No current facility-administered medications for this visit.   Facility-Administered Medications Ordered in Other Visits  Medication Dose Route Frequency Provider Last Rate Last Admin   cyanocobalamin  ((VITAMIN B-12)) injection 1,000 mcg  1,000 mcg Intramuscular Q30 days Rao, Archana C, MD   1,000 mcg at 08/27/21 1537   cyanocobalamin  (VITAMIN B12) injection 1,000 mcg  1,000 mcg Intramuscular Q30 days Rao, Archana C, MD   1,000 mcg at 03/16/22 1101   cyanocobalamin  (VITAMIN B12) injection 1,000 mcg  1,000 mcg Intramuscular Q30 days Rao, Archana C, MD   1,000 mcg at 11/24/23 1404    VITAL SIGNS: There were no vitals taken for this visit. There were no vitals filed for this visit.   Estimated body mass index is 19.84 kg/m as calculated from the following:   Height as of 09/12/23: 5' 1 (1.549 m).   Weight as of 09/12/23: 105 lb (47.6 kg).  LABS: CBC:    Component Value Date/Time   WBC 6.8 09/12/2023 1100   HGB 12.9 09/12/2023 1100   HGB 11.7 (L) 09/04/2023 1318   HGB 14.0 11/03/2021 1551   HCT 39.7 09/12/2023 1100   HCT 42.1 11/03/2021 1551   PLT 188 09/12/2023 1100   PLT 184 09/04/2023 1318   PLT 216 11/03/2021 1551   MCV 95.4 09/12/2023 1100   MCV 97 11/03/2021 1551   MCV 100 04/08/2014 0409   NEUTROABS 6.1 09/04/2023 1318   NEUTROABS 3.5 11/03/2021 1551   NEUTROABS 6.1 04/08/2014 0409   LYMPHSABS 0.4 (L) 09/04/2023 1318   LYMPHSABS 1.2 11/03/2021 1551   LYMPHSABS 0.8 (L) 04/08/2014 0409   MONOABS 0.1 09/04/2023 1318   MONOABS 0.7 04/08/2014 0409   EOSABS 0.0 09/04/2023 1318   EOSABS 0.2 11/03/2021 1551   EOSABS 0.0 04/08/2014 0409   BASOSABS 0.0 09/04/2023 1318   BASOSABS 0.0 11/03/2021 1551   BASOSABS 0.0 04/08/2014 0409   Comprehensive Metabolic Panel:    Component  Value Date/Time   NA 134 (L) 09/04/2023 1318   NA 145 (H) 11/03/2021 1551   NA 143 04/08/2014 0409   K 4.5 09/04/2023 1318   K 3.5 04/08/2014 0409   CL 101 09/04/2023 1318   CL 113 (H) 04/08/2014 0409   CO2 25 09/04/2023 1318   CO2 26 04/08/2014 0409   BUN 25 (H) 09/04/2023 1318   BUN 13 11/03/2021 1551   BUN 6 (L) 04/08/2014 0409   CREATININE 0.60 09/04/2023 1318   CREATININE 0.67 04/08/2014 0409   GLUCOSE 211 (H) 09/04/2023 1318   GLUCOSE 84 04/08/2014 0409   CALCIUM  8.8 (L) 09/04/2023 1318   CALCIUM  7.5 (L) 04/08/2014 0409   AST 71 (H) 09/04/2023 1318   ALT 87 (H) 09/04/2023 1318   ALT 21 11/26/2012 1019   ALKPHOS 79 09/04/2023 1318   ALKPHOS 87 11/26/2012 1019   BILITOT 0.4 09/04/2023 1318   PROT 6.3 (L) 09/04/2023 1318   PROT 6.3 11/03/2021 1551   PROT 6.4 11/26/2012 1019   ALBUMIN  3.6 09/04/2023 1318   ALBUMIN  4.1 11/03/2021 1551   ALBUMIN  3.5 11/26/2012 1019    RADIOGRAPHIC STUDIES: No results found.  PERFORMANCE STATUS (ECOG) : 1 - Symptomatic but completely ambulatory  Review of Systems Unless otherwise noted, a complete review of systems is negative.  Physical Exam General: NAD Pulmonary: Unlabored Abdomen: soft, nontender, PEG with some excoriation inferiorly GU: no suprapubic tenderness Extremities: Left lower extremity edema/erythema, no joint deformities Skin: no rashes Neurological: Weakness but otherwise nonfocal  IMPRESSION/PLAN: NSCLC -status post chemoradiation.  Now on surveillance.  PEG redness -skin appears slightly excoriated where her external bumper is  lying on the skin.  Patient does endorse occasional leakage but I do not note any significant leakage at the time of my exam.  I did suggest that she could use Vaseline or skin/barrier protectant and utilize fenestrated dressings to offload pressure.  Can consider referral back to IR for general surgery if needed.  Hopefully, PEG can be considered for removal as nutrition improves and weight  stabilizes.   Patient expressed understanding and was in agreement with this plan. She also understands that She can call clinic at any time with any questions, concerns, or complaints.   Thank you for allowing me to participate in the care of this very pleasant patient.   Time Total: 15 minutes  Visit consisted of counseling and education dealing with the complex and emotionally intense issues of symptom management in the setting of serious illness.Greater than 50%  of this time was spent counseling and coordinating care related to the above assessment and plan.  Signed by: Fonda Mower, PhD, NP-C

## 2023-11-27 ENCOUNTER — Other Ambulatory Visit: Payer: Self-pay | Admitting: Family Medicine

## 2023-11-27 DIAGNOSIS — G2581 Restless legs syndrome: Secondary | ICD-10-CM

## 2023-11-27 DIAGNOSIS — R251 Tremor, unspecified: Secondary | ICD-10-CM

## 2023-11-27 DIAGNOSIS — R52 Pain, unspecified: Secondary | ICD-10-CM

## 2023-11-27 DIAGNOSIS — F418 Other specified anxiety disorders: Secondary | ICD-10-CM

## 2023-11-27 MED ORDER — ROPINIROLE HCL 0.5 MG PO TABS: 0.5000 mg | ORAL_TABLET | Freq: Three times a day (TID) | ORAL | 1 refills | Status: AC

## 2023-11-27 MED ORDER — MELOXICAM 7.5 MG PO TABS: 7.5000 mg | ORAL_TABLET | Freq: Every day | ORAL | 1 refills | Status: AC

## 2023-11-27 MED ORDER — LACTULOSE ENCEPHALOPATHY 10 GM/15ML PO SOLN: 20.0000 g | Freq: Two times a day (BID) | ORAL | 3 refills | Status: AC

## 2023-11-27 MED ORDER — BUSPIRONE HCL 5 MG PO TABS: 5.0000 mg | ORAL_TABLET | Freq: Three times a day (TID) | ORAL | 2 refills | Status: AC | PRN

## 2023-11-27 NOTE — Telephone Encounter (Unsigned)
 Copied from CRM [id number]. Topic: Clinical - Medication Refill >> Nov 27, 2023  4:22 PM Donee H wrote: Medication: busPIRone  (BUSPAR ) 5 MG tablet   meloxicam  (MOBIC ) 7.5mg  rOPINIRole  (REQUIP ) 0.5mg  lactulose , encephalopathy, (CHRONULAC ) 10 GM/15ML SOLN  Has the patient contacted their pharmacy? Yes, pharmacy calling on behalf of patient  This is the patient's preferred pharmacy:  [facility] - [place], [place] - [address] [place] [address] Phone: [phone] Fax: [phone]  Is this the correct pharmacy for this prescription? Yes   Has the prescription been filled recently? No  Is the patient out of the medication? Yes, states she is a cancer patient and in need of medication as soon as possible  Has the patient been seen for an appointment in the last year OR does the patient have an upcoming appointment? Yes  Can we respond through MyChart? Yes  Agent: Please be advised that Rx refills may take up to 3 business days. We ask that you follow-up with your pharmacy.

## 2023-12-06 ENCOUNTER — Ambulatory Visit: Payer: Medicare HMO

## 2023-12-06 DIAGNOSIS — Z780 Asymptomatic menopausal state: Secondary | ICD-10-CM

## 2023-12-06 DIAGNOSIS — Z0000 Encounter for general adult medical examination without abnormal findings: Secondary | ICD-10-CM | POA: Diagnosis not present

## 2023-12-06 NOTE — Patient Instructions (Signed)
 Denise Watts , Thank you for taking time out of your busy schedule to complete your Annual Wellness Visit with me. I enjoyed our conversation and look forward to speaking with you again next year. I, as well as your care team,  appreciate your ongoing commitment to your health goals. Please review the following plan we discussed and let me know if I can assist you in the future.  REFERRAL SENT FOR BONE DENSITY SCAN  You have an order for:  []   2D Mammogram  []   3D Mammogram  [x]   Bone Density     Please call for appointment:  [facility]  [address] [place] [address] [phone] [facility] [address] [place], [place] [address] [phone] [facility] [address], [place] [address] [phone]   Make sure to wear two-piece clothing.  No lotions, powders, or deodorants the day of the appointment. Make sure to bring picture ID and insurance card.  Bring list of medications you are currently taking including any supplements.   Schedule your [place] screening mammogram through MyChart!   Log into your MyChart account.  Go to 'Visit' (or 'Appointments' if on mobile App) --> Schedule an Appointment  Under 'Select a Reason for Visit' choose the Mammogram Screening option.  Complete the pre-visit questions and select the time and place that best fits your schedule.   Follow up Visits: 12/11/24 @ 10:10 AM BY PHONE We will see or speak with you next year for your Next Medicare AWV with our clinical staff Have you seen your provider in the last 6 months (3 months if uncontrolled diabetes)? Yes  Clinician Recommendations:  Aim for 30 minutes of exercise or brisk walking, 6-8 glasses of water , and 5 servings of fruits and vegetables each day. TAKE CARE!      This is a list of the screenings recommended for you:  Health Maintenance   Topic Date Due   Zoster (Shingles) Vaccine (1 of 2) Never done   DEXA scan (bone density measurement)  Never done   COVID-19 Vaccine (4 - 2024-25 season) 01/01/2023   Flu Shot  12/01/2023   Mammogram  03/08/2024   Medicare Annual Wellness Visit  12/05/2024   Colon Cancer Screening  01/04/2026   DTaP/Tdap/Td vaccine (2 - Td or Tdap) 05/06/2033   Pneumococcal Vaccine for age over 36  Completed   Hepatitis C Screening  Completed   Hepatitis B Vaccine  Aged Out   HPV Vaccine  Aged Out   Meningitis B Vaccine  Aged Out    Advanced directives: (ACP Link)Information on Advanced Care Planning can be found at [place]  Print production planner Health Care Directives Advance Health Care Directives. [email or web address]  Advance Care Planning is important because it:  [x]  Makes sure you receive the medical care that is consistent with your values, goals, and preferences  [x]  It provides guidance to your family and loved ones and reduces their decisional burden about whether or not they are making the right decisions based on your wishes.  Follow the link provided in your after visit summary or read over the paperwork we have mailed to you to help you started getting your Advance Directives in place. If you need assistance in completing these, please reach out to us  so that we can help you!

## 2023-12-06 NOTE — Progress Notes (Signed)
 Subjective:   Denise Watts is a 68 y.o. who presents for a Medicare Wellness preventive visit.  As a reminder, Annual Wellness Visits don't include a physical exam, and some assessments may be limited, especially if this visit is performed virtually. We may recommend an in-person follow-up visit with your provider if needed.  Visit Complete: Virtual I connected with  Denise Watts on 12/06/23 by a audio enabled telemedicine application and verified that I am speaking with the correct person using two identifiers.  Patient Location: Home  Provider Location: Home Office  I discussed the limitations of evaluation and management by telemedicine. The patient expressed understanding and agreed to proceed.  Vital Signs: Because this visit was a virtual/telehealth visit, some criteria may be missing or patient reported. Any vitals not documented were not able to be obtained and vitals that have been documented are patient reported.  VideoDeclined- This patient declined Librarian, academic. Therefore the visit was completed with audio only.  Persons Participating in Visit: Patient.  AWV Questionnaire: No: Patient Medicare AWV questionnaire was not completed prior to this visit.  Cardiac Risk Factors include: advanced age (>41men, >21 women);dyslipidemia     Objective:    Today's Vitals   12/06/23 1314  PainSc: 4    There is no height or weight on file to calculate BMI.     12/06/2023    1:20 PM 09/04/2023    1:36 PM 07/03/2023   11:17 AM 06/28/2023    1:18 PM 06/23/2023    3:31 PM 05/25/2023    1:06 PM 05/24/2023    6:31 PM  Advanced Directives  Does Patient Have a Medical Advance Directive? No No No No No No No  Would patient like information on creating a medical advance directive? No - Patient declined No - Patient declined No - Patient declined No - Patient declined No - Patient declined  No - Patient declined    Current Medications (verified) Outpatient  Encounter Medications as of 12/06/2023  Medication Sig   albuterol  (VENTOLIN  HFA) 108 (90 Base) MCG/ACT inhaler Inhale 2 puffs into the lungs every 6 (six) hours as needed for wheezing or shortness of breath.   apixaban  (ELIQUIS ) 5 MG TABS tablet Take 1 tablet (5 mg total) by mouth 2 (two) times daily.   bisacodyl  (DULCOLAX) 10 MG suppository Place 1 suppository (10 mg total) rectally daily as needed for moderate constipation or severe constipation.   busPIRone  (BUSPAR ) 5 MG tablet Take 1 tablet (5 mg total) by mouth 3 (three) times daily as needed.   DULoxetine  (CYMBALTA ) 60 MG capsule Take 1 capsule (60 mg total) by mouth daily.   fentaNYL  (DURAGESIC ) 12 MCG/HR Place 1 patch onto the skin every 3 (three) days.   Fluticasone -Umeclidin-Vilant (TRELEGY ELLIPTA ) 100-62.5-25 MCG/ACT AEPB Inhale 1 Act into the lungs daily.   lactulose , encephalopathy, (CHRONULAC ) 10 GM/15ML SOLN Place 30 mLs (20 g total) into feeding tube 2 (two) times daily.   lidocaine  (LIDODERM ) 5 % Place 1 patch onto the skin daily. Remove & Discard patch within 12 hours or as directed by MD   meloxicam  (MOBIC ) 7.5 MG tablet Take 1 tablet (7.5 mg total) by mouth daily.   metoprolol  succinate (TOPROL -XL) 25 MG 24 hr tablet TAKE 1 TABLET BY MOUTH DAILY   montelukast  (SINGULAIR ) 10 MG tablet TAKE 1 TABLET BY MOUTH AT BEDTIME   Nutritional Supplements (NUTREN 1.5) LIQD Give 1 carton 4 times a day (8am, noon, 4pm and 8pm). Flush with  60ml of water  before and after each feeding.  Give additional 1 1/2 cups of water  a day via tube or drink orally for adequate hydration.   omeprazole  (KONVOMEP ) 2 mg/mL SUSP oral suspension Take 20 mLs (40 mg total) by mouth daily.   ondansetron  (ZOFRAN ) 4 MG tablet Take 1 tablet (4 mg total) by mouth every 6 (six) hours as needed for nausea or vomiting.   oxyCODONE  (ROXICODONE ) 5 MG/5ML solution Take 5 mLs (5 mg total) by mouth every 8 (eight) hours as needed for severe pain (pain score 7-10).   predniSONE   (DELTASONE ) 20 MG tablet Take 1 tablet (20 mg total) by mouth daily with breakfast. 7 days   rOPINIRole  (REQUIP ) 0.5 MG tablet Take 1 tablet (0.5 mg total) by mouth 3 (three) times daily.   rosuvastatin  (CRESTOR ) 40 MG tablet Place 1 tablet (40 mg total) into feeding tube daily.   senna (SENOKOT) 8.6 MG TABS tablet Place 1 tablet (8.6 mg total) into feeding tube daily.   simethicone  (MYLICON) 80 MG chewable tablet Place 1 tablet (80 mg total) into feeding tube 4 (four) times daily.   sucralfate  (CARAFATE ) 1 GM/10ML suspension Place 10 mLs (1 g total) into feeding tube 4 (four) times daily.   tolterodine  (DETROL  LA) 4 MG 24 hr capsule Take 4 mg by mouth daily.   triamcinolone  cream (KENALOG ) 0.1 % Apply 1 Application topically 2 (two) times daily.   Water  For Irrigation, Sterile (FREE WATER ) SOLN Place 60 mLs into feeding tube 5 (five) times daily.   benzonatate  (TESSALON ) 100 MG capsule Take 2 capsules (200 mg total) by mouth 3 (three) times daily as needed for cough. (Patient not taking: Reported on 12/06/2023)   cefdinir  (OMNICEF ) 300 MG capsule Take 1 capsule (300 mg total) by mouth 2 (two) times daily. (Patient not taking: Reported on 12/06/2023)   Facility-Administered Encounter Medications as of 12/06/2023  Medication   cyanocobalamin  ((VITAMIN B-12)) injection 1,000 mcg   cyanocobalamin  (VITAMIN B12) injection 1,000 mcg    Allergies (verified) Sulfa  antibiotics   History: Past Medical History:  Diagnosis Date   Acute pulmonary embolism (HCC) 05/10/2023   Adenomatous colon polyp    Adrenal carcinoma, right (HCC) 12/28/2016   a.) high grade carcinoma with extensive necrosis --> s/p adrenalectomy   Anxiety    Aortic atherosclerosis (HCC)    Arthritis of right knee    Atrial fibrillation and flutter (HCC)    a.) CHA2DS2-VASc = 3 (age, sex, vascular disease history) as of 02/13/2023; b.) cardiac rate/rhythm maintained intrinsically without pharmacological intervention; no chronic OAC  (does take low dose ASA)   B12 deficiency    Breast cancer (HCC) 1997   a.) stage IB (cT1a, cN0, cM0, G3, ER+, PR-, HER2-); s/p lumpectomy + XRT   COPD (chronic obstructive pulmonary disease) (HCC)    Coronary artery disease    a.) cCTA 12/09/2021: Ca2+ = 135 (80th %'ile for age/sex/race match contol) --> 25-49% pLAD and RCA distributions   Depression    Diastolic dysfunction    a.) TTE 12/11/2020, no RWMAs, norm RVSF, G1DD; b.) TTE 12/14/2021: EF 55-60, no RWMAs, norm RVSF, G1DD; c.) TTE 01/27/2023: EF 50-55%, no RWMAs, G1DD, mild RAE, mild MR, mod TR   Dry cough 03/02/2017   Dyspnea    Full dentures    GERD (gastroesophageal reflux disease)    History of hiatal hernia    HLD (hyperlipidemia)    Invasive carcinoma of breast (HCC) 11/15/2017   a.) path (+) for G3, ER -,  PR -, HER2/neu - ; s/p lumpectomy 01/17/2018 + 4 cycles TC systemic chemotherapy + XRT   Long term current use of aspirin     Personal history of chemotherapy    Personal history of radiation therapy    Pneumothorax of left lung after biopsy 01/25/2023   a.) hypoxic in PACU requiring re-intubation --> transferred to ICU --> chest tube placed by PCCM   RLS (restless legs syndrome)    a.) on ropinirole    Skin cancer    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   a.) Stage IB (cT1a, cN0, cM0, G3, ER+, PR-, HER2-)   Past Surgical History:  Procedure Laterality Date   BIOPSY  01/05/2023   Procedure: BIOPSY;  Surgeon: Unk Corinn Skiff, MD;  Location: [facility];  Service: Gastroenterology;;   BREAST BIOPSY Right 1997   positive   BREAST BIOPSY Right 11/15/2017   INVASIVE MAMMARY CARCINOMA triple negative   BREAST BIOPSY Right 07/20/2020   us  bx, Q marker, neg   BREAST LUMPECTOMY Right 1997   2019 also   BREAST LUMPECTOMY WITH SENTINEL LYMPH NODE BIOPSY Right 12/08/2017   Procedure: BREAST LUMPECTOMY WITH SENTINEL LYMPH NODE BX;  Surgeon: Dessa Reyes ORN, MD;  Location: [facility];  Service: General;   Laterality: Right;   BREAST REDUCTION WITH MASTOPEXY Left 06/10/2019   Procedure: Left breast mastopexy reduction for symmetry;  Surgeon: Lowery Estefana RAMAN, DO;  Location: [facility];  Service: Plastics;  Laterality: Left;  total 2.5 hours   CATARACT EXTRACTION W/PHACO Right 02/07/2022   Procedure: CATARACT EXTRACTION PHACO AND INTRAOCULAR LENS PLACEMENT (IOC) RIGHT DIABETIC;  Surgeon: Myrna Adine Anes, MD;  Location: [facility];  Service: Ophthalmology;  Laterality: Right;  3.60 00:34.7   CATARACT EXTRACTION W/PHACO Left 02/28/2022   Procedure: CATARACT EXTRACTION PHACO AND INTRAOCULAR LENS PLACEMENT (IOC) LEFT DIABETIC 4.11 00:26.1;  Surgeon: Myrna Adine Anes, MD;  Location: [facility];  Service: Ophthalmology;  Laterality: Left;   COLONOSCOPY  2015   COLONOSCOPY N/A 01/05/2023   Procedure: COLONOSCOPY;  Surgeon: Unk Corinn Skiff, MD;  Location: [facility];  Service: Gastroenterology;  Laterality: N/A;   COLONOSCOPY WITH PROPOFOL  N/A 10/29/2019   Procedure: COLONOSCOPY WITH PROPOFOL ;  Surgeon: Jinny Carmine, MD;  Location: [facility];  Service: Endoscopy;  Laterality: N/A;   ESOPHAGOGASTRODUODENOSCOPY (EGD) WITH PROPOFOL  N/A 05/24/2019   Procedure: ESOPHAGOGASTRODUODENOSCOPY (EGD) WITH PROPOFOL ;  Surgeon: Jinny Carmine, MD;  Location: [facility];  Service: Endoscopy;  Laterality: N/A;   ESOPHAGOGASTRODUODENOSCOPY (EGD) WITH PROPOFOL  N/A 09/06/2022   Procedure: ESOPHAGOGASTRODUODENOSCOPY (EGD) WITH PROPOFOL ;  Surgeon: Jinny Carmine, MD;  Location: [facility];  Service: Endoscopy;  Laterality: N/A;   ESOPHAGOGASTRODUODENOSCOPY (EGD) WITH PROPOFOL   01/05/2023   Procedure: ESOPHAGOGASTRODUODENOSCOPY (EGD) WITH PROPOFOL ;  Surgeon: Unk Corinn Skiff, MD;  Location: [facility];  Service: Gastroenterology;;   ESOPHAGUS SURGERY     GASTROSTOMY N/A 05/25/2023   Procedure: OPEN INSERTION OF GASTROSTOMY TUBE;  Surgeon: Jordis Laneta FALCON, MD;  Location:  [facility];  Service: General;  Laterality: N/A;   IR IMAGING GUIDED PORT INSERTION  02/07/2023   IR REPLC GASTRO/COLONIC TUBE PERCUT W/FLUORO  09/12/2023   KNEE SURGERY Right    LIPOSUCTION WITH LIPOFILLING Right 06/10/2019   Procedure: right breast scar and capsule release with fat grafting;  Surgeon: Lowery Estefana RAMAN, DO;  Location: [facility];  Service: Plastics;  Laterality: Right;   POLYPECTOMY  01/05/2023   Procedure: POLYPECTOMY;  Surgeon: Unk Corinn Skiff, MD;  Location: [facility];  Service: Gastroenterology;;   [facility] REMOVAL     PORTACATH PLACEMENT Right 01/17/2018   Procedure: INSERTION PORT-A-CATH- RIGHT;  Surgeon: Dessa Reyes ORN, MD;  Location: [facility];  Service: General;  Laterality: Right;   PULMONARY THROMBECTOMY Bilateral 05/12/2023   Procedure: PULMONARY THROMBECTOMY;  Surgeon: Marea Selinda RAMAN, MD;  Location: [facility];  Service: Cardiovascular;  Laterality: Bilateral;   ROBOTIC ADRENALECTOMY Right 12/28/2016   Procedure: ROBOTIC ADRENALECTOMY;  Surgeon: Penne Knee, MD;  Location: [facility];  Service: Urology;  Laterality: Right;   SKIN CANCER EXCISION     THROAT SURGERY  1998   throat cancer    TUBAL LIGATION     VENTRAL HERNIA REPAIR N/A 03/03/2017   Procedure: HERNIA REPAIR VENTRAL ADULT;  Surgeon: Shelva Dunnings, MD;  Location: [facility];  Service: General;  Laterality: N/A;   VIDEO BRONCHOSCOPY WITH ENDOBRONCHIAL ULTRASOUND N/A 01/25/2023   Procedure: VIDEO BRONCHOSCOPY WITH ENDOBRONCHIAL ULTRASOUND;  Surgeon: Parris Manna, MD;  Location: [facility];  Service: Thoracic;  Laterality: N/A;   VIDEO BRONCHOSCOPY WITH ENDOBRONCHIAL ULTRASOUND N/A 02/17/2023   Procedure: VIDEO BRONCHOSCOPY WITH ENDOBRONCHIAL ULTRASOUND;  Surgeon: Parris Manna, MD;  Location: [facility];  Service: Thoracic;  Laterality: N/A;   Family History  Problem Relation Age of Onset   Diabetes Mother    Heart attack Mother    Cancer Sister        cancer  of eyes, spread to lung, other places. died at 44   Cancer Sister 82       unknown primary, spread to bone, brain died in 2's   Cancer Paternal Aunt        unk type   Cancer Paternal Grandmother        type unk   Prostate cancer Neg Hx    Kidney cancer Neg Hx    Bladder Cancer Neg Hx    Breast cancer Neg Hx    Social History   Socioeconomic History   Marital status: Married    Spouse name: Iwanicki,Joseph (Spouse) [phone] ([place])   Number of children: 4   Years of education: Not on file   Highest education level: 10th grade  Occupational History   Occupation: retired  Tobacco Use   Smoking status: Former    Current packs/day: 0.00    Average packs/day: 1.5 packs/day for 30.0 years (45.0 ttl pk-yrs)    Types: Cigarettes    Start date: 05/02/1965    Quit date: 05/03/1995    Years since quitting: 28.6    Passive exposure: Past   Smokeless tobacco: Never  Vaping Use   Vaping status: Never Used  Substance and Sexual Activity   Alcohol use: Not Currently    Alcohol/week: 4.0 standard drinks of alcohol    Types: 4 Shots of liquor per week    Comment: none   Drug use: No   Sexual activity: Not Currently    Birth control/protection: Post-menopausal  Other Topics Concern   Not on file  Social History Narrative   Patient from home with husband.   Social Drivers of Corporate investment banker Strain: Low Risk  (12/06/2023)   Overall Financial Resource Strain (CARDIA)    Difficulty of Paying Living Expenses: Not hard at all  Food Insecurity: No Food Insecurity (12/06/2023)   Hunger Vital Sign    Worried About Running Out of Food in the Last Year: Never true    Ran Out of Food in the Last Year: Never true  Transportation Needs:  No Transportation Needs (12/06/2023)   PRAPARE - Administrator, Civil Service (Medical): No    Lack of Transportation (Non-Medical): No  Physical Activity: Insufficiently Active (12/06/2023)   Exercise Vital Sign    Days of Exercise per Week:  3 days    Minutes of Exercise per Session: 30 min  Stress: No Stress Concern Present (12/06/2023)   [employer] of Occupational Health - Occupational Stress Questionnaire    Feeling of Stress: Only a little  Social Connections: Moderately Isolated (12/06/2023)   Social Connection and Isolation Panel    Frequency of Communication with Friends and Family: More than three times a week    Frequency of Social Gatherings with Friends and Family: Never    Attends Religious Services: Never    Database administrator or Organizations: No    Attends Engineer, structural: Never    Marital Status: Married    Tobacco Counseling Counseling given: Not Answered    Clinical Intake:  Pre-visit preparation completed: Yes  Pain : 0-10 Pain Score: 4  Pain Type: Chronic pain Pain Location: Back Pain Orientation: Lower Pain Descriptors / Indicators: Aching, Discomfort, Constant Pain Onset: More than a month ago Pain Frequency: Constant     BMI - recorded: 20.4 Nutritional Status: BMI of 19-24  Normal Nutritional Risks: None Diabetes: No  Lab Results  Component Value Date   HGBA1C 5.7 (H) 11/03/2021   HGBA1C 5.6 05/26/2021   HGBA1C 6.4 (H) 02/07/2021     How often do you need to have someone help you when you read instructions, pamphlets, or other written materials from your doctor or pharmacy?: 1 - Never  Interpreter Needed?: No  Information entered by :: JHONNIE DAS, LPN   Activities of Daily Living     12/06/2023    1:22 PM 09/12/2023   10:55 AM  In your present state of health, do you have any difficulty performing the following activities:  Hearing? 0 0  Vision? 0 0  Difficulty concentrating or making decisions? 0 0  Walking or climbing stairs? 1   Comment LEG PAIN, SHOB   Dressing or bathing? 0   Doing errands, shopping? 0   Preparing Food and eating ? N   Using the Toilet? N   In the past six months, have you accidently leaked urine? N   Do you have  problems with loss of bowel control? N   Managing your Medications? N   Managing your Finances? N   Housekeeping or managing your Housekeeping? N     Patient Care Team: [name], DO as PCP - General (Family Medicine) Darliss Rogue, MD as PCP - Cardiology (Cardiology) Melanee Annah BROCKS, MD as Consulting Physician (Oncology) Jordis, Laneta FALCON, MD as Consulting Physician (General Surgery) Dasher, Alm LABOR, MD (Dermatology) Scheeler, Donnice PARAS, PA-C as Physician Assistant (Plastic Surgery) Dorothyann Drivers, MD as Attending Physician (Emergency Medicine) Ferrel, Lionel Sotero RIGGERS (Neurology) French Mering, MD as Referring Physician (Internal Medicine) Shellia Oh, MD (Inactive) as Consulting Physician (Pulmonary Disease) Pa, [facility] (Optometry) Verdene Gills, RN as Oncology Nurse Navigator Lenn Aran, MD as Consulting Physician (Radiation Oncology) Moises Reusing, RN as [facility], [place], LCSW as [facility], [place], [place]  I have updated your Care Teams any recent Medical Services you may have received from other providers in the past year.     Assessment:   This is a routine wellness examination for Denise Watts.  Hearing/Vision screen  Hearing Screening - Comments:: NO AIDS Vision Screening - Comments:: READERS- [facility]   Goals Addressed             This Visit's Progress    Cut out extra servings         Depression Screen     12/06/2023    1:18 PM 07/21/2023   10:55 AM 07/03/2023   12:33 PM 06/19/2023   11:22 AM 05/19/2023    3:15 PM 03/10/2023    1:17 PM 12/28/2022    4:25 PM  PHQ 2/9 Scores  PHQ - 2 Score 0 2 2 5 1 4 6   PHQ- 9 Score 0 12 10 16  15 14     Fall Risk     12/06/2023    1:21 PM 07/03/2023   12:27 PM 06/19/2023   11:23 AM 03/10/2023    1:18 PM 12/28/2022    4:25 PM  Fall Risk   Falls in the past year? 1 1 1  0 0  Number falls in past yr: 0 0 0 0 0  Injury with Fall? 1 1 1  0 0  Risk for  fall due to : History of fall(s);Impaired balance/gait   No Fall Risks   Follow up Falls evaluation completed;Falls prevention discussed   Falls evaluation completed     MEDICARE RISK AT HOME:  Medicare Risk at Home Any stairs in or around the home?: Yes If so, are there any without handrails?: No Home free of loose throw rugs in walkways, pet beds, electrical cords, etc?: Yes Adequate lighting in your home to reduce risk of falls?: Yes Life alert?: No Use of a cane, walker or w/c?: No Grab bars in the bathroom?: Yes Shower chair or bench in shower?: No Elevated toilet seat or a handicapped toilet?: No  TIMED UP AND GO:  Was the test performed?  No  Cognitive Function: 6CIT completed        12/06/2023    1:24 PM 12/05/2022    1:30 PM 12/02/2021    1:52 PM  6CIT Screen  What Year? 0 points 0 points 0 points  What month? 0 points 0 points 0 points  What time? 0 points 0 points 0 points  Count back from 20 0 points 0 points 0 points  Months in reverse 0 points 4 points 0 points  Repeat phrase 0 points 0 points 0 points  Total Score 0 points 4 points 0 points    Immunizations Immunization History  Administered Date(s) Administered   Fluad Quad(high Dose 65+) 01/31/2019, 02/20/2020, 05/26/2021   Fluad Trivalent(High Dose 65+) 12/28/2022   Influenza,inj,Quad PF,6+ Mos 03/02/2017, 06/08/2018   Influenza-Unspecified 05/03/2023   PFIZER(Purple Top)SARS-COV-2 Vaccination 07/05/2019, 07/26/2019, 03/31/2020   PNEUMOCOCCAL CONJUGATE-20 05/26/2021   Tdap 05/07/2023    Screening Tests Health Maintenance  Topic Date Due   Zoster Vaccines- Shingrix (1 of 2) Never done   DEXA SCAN  Never done   COVID-19 Vaccine (4 - 2024-25 season) 01/01/2023   INFLUENZA VACCINE  12/01/2023   MAMMOGRAM  03/08/2024   Medicare Annual Wellness (AWV)  12/05/2024   Colonoscopy  01/04/2026   DTaP/Tdap/Td (2 - Td or Tdap) 05/06/2033   Pneumococcal Vaccine: 50+ Years  Completed   Hepatitis C Screening   Completed   Hepatitis B Vaccines  Aged Out   HPV VACCINES  Aged Out   Meningococcal B Vaccine  Aged Out    Health Maintenance  Health Maintenance Due  Topic Date Due   Zoster Vaccines- Shingrix (  1 of 2) Never done   DEXA SCAN  Never done   COVID-19 Vaccine (4 - 2024-25 season) 01/01/2023   INFLUENZA VACCINE  12/01/2023   Health Maintenance Items Addressed: DEXA ordered; MAMMOGRAM UP TO DATE, COLONOSCOPY UP TO DATE; UP TO DATE PNA & TDAP- NEEDS COVID & SHINGRIX  Additional Screening:  Vision Screening: Recommended annual ophthalmology exams for early detection of glaucoma and other disorders of the eye. Would you like a referral to an eye doctor? No    Dental Screening: Recommended annual dental exams for proper oral hygiene  Community Resource Referral / Chronic Care Management: CRR required this visit?  No   CCM required this visit?  No   Plan:    I have personally reviewed and noted the following in the patient's chart:   Medical and social history Use of alcohol, tobacco or illicit drugs  Current medications and supplements including opioid prescriptions. Patient is not currently taking opioid prescriptions. Functional ability and status Nutritional status Physical activity Advanced directives List of other physicians Hospitalizations, surgeries, and ER visits in previous 12 months Vitals Screenings to include cognitive, depression, and falls Referrals and appointments  In addition, I have reviewed and discussed with patient certain preventive protocols, quality metrics, and best practice recommendations. A written personalized care plan for preventive services as well as general preventive health recommendations were provided to patient.   Jhonnie GORMAN Das, LPN   05/04/7972   After Visit Summary: (MyChart) Due to this being a telephonic visit, the after visit summary with patients personalized plan was offered to patient via MyChart   Notes: DEXA  ORDERED

## 2023-12-08 ENCOUNTER — Other Ambulatory Visit: Payer: Self-pay

## 2023-12-08 MED ORDER — OXYCODONE HCL 5 MG/5ML PO SOLN: 5.0000 mg | Freq: Three times a day (TID) | ORAL | 0 refills | Status: DC | PRN

## 2023-12-08 NOTE — Telephone Encounter (Signed)
 Patient called asking for refill. Last filled on 7/25. She stated she was taking every 6hr PRN I informed her it should be every 8hr. She agrees and verbalizes understanding

## 2023-12-12 ENCOUNTER — Telehealth: Payer: Self-pay | Admitting: Oncology

## 2023-12-12 NOTE — Telephone Encounter (Signed)
 Called pt to remind of CT tomorrow - [facility]

## 2023-12-13 ENCOUNTER — Ambulatory Visit
Admission: RE | Admit: 2023-12-13 | Discharge: 2023-12-13 | Disposition: A | Discharge status: Home / Self Care | Source: Ambulatory Visit | Attending: Oncology | Admitting: Oncology

## 2023-12-13 DIAGNOSIS — K5730 Diverticulosis of large intestine without perforation or abscess without bleeding: Secondary | ICD-10-CM | POA: Diagnosis not present

## 2023-12-13 DIAGNOSIS — N281 Cyst of kidney, acquired: Secondary | ICD-10-CM | POA: Diagnosis not present

## 2023-12-13 DIAGNOSIS — C3492 Malignant neoplasm of unspecified part of left bronchus or lung: Secondary | ICD-10-CM | POA: Diagnosis not present

## 2023-12-13 DIAGNOSIS — E538 Deficiency of other specified B group vitamins: Secondary | ICD-10-CM | POA: Insufficient documentation

## 2023-12-13 DIAGNOSIS — C3490 Malignant neoplasm of unspecified part of unspecified bronchus or lung: Secondary | ICD-10-CM | POA: Diagnosis not present

## 2023-12-13 MED ORDER — IOHEXOL 300 MG/ML  SOLN
80.0000 mL | Freq: Once | INTRAMUSCULAR | Status: AC | PRN
  Administered 2023-12-13 (×2): 80 mL via INTRAVENOUS

## 2023-12-21 ENCOUNTER — Ambulatory Visit
Admission: RE | Admit: 2023-12-21 | Discharge: 2023-12-21 | Disposition: A | Discharge status: Home / Self Care | Source: Ambulatory Visit | Attending: Radiation Oncology | Admitting: Radiation Oncology

## 2023-12-21 ENCOUNTER — Other Ambulatory Visit: Payer: Self-pay | Admitting: *Deleted

## 2023-12-21 ENCOUNTER — Encounter: Payer: Self-pay | Admitting: Radiation Oncology

## 2023-12-21 VITALS — BP 110/73 | HR 66 | Temp 98.2°F | Resp 20 | Wt 108.0 lb

## 2023-12-21 DIAGNOSIS — Z931 Gastrostomy status: Secondary | ICD-10-CM | POA: Diagnosis not present

## 2023-12-21 DIAGNOSIS — Z87891 Personal history of nicotine dependence: Secondary | ICD-10-CM | POA: Diagnosis not present

## 2023-12-21 DIAGNOSIS — C3412 Malignant neoplasm of upper lobe, left bronchus or lung: Secondary | ICD-10-CM | POA: Diagnosis not present

## 2023-12-21 DIAGNOSIS — C3490 Malignant neoplasm of unspecified part of unspecified bronchus or lung: Secondary | ICD-10-CM

## 2023-12-21 DIAGNOSIS — R1310 Dysphagia, unspecified: Secondary | ICD-10-CM | POA: Diagnosis not present

## 2023-12-21 DIAGNOSIS — C3432 Malignant neoplasm of lower lobe, left bronchus or lung: Secondary | ICD-10-CM | POA: Diagnosis not present

## 2023-12-21 DIAGNOSIS — Z853 Personal history of malignant neoplasm of breast: Secondary | ICD-10-CM | POA: Diagnosis not present

## 2023-12-21 DIAGNOSIS — Z9221 Personal history of antineoplastic chemotherapy: Secondary | ICD-10-CM | POA: Insufficient documentation

## 2023-12-21 DIAGNOSIS — C3482 Malignant neoplasm of overlapping sites of left bronchus and lung: Secondary | ICD-10-CM

## 2023-12-21 DIAGNOSIS — Z923 Personal history of irradiation: Secondary | ICD-10-CM | POA: Insufficient documentation

## 2023-12-21 NOTE — Progress Notes (Signed)
 Radiation Oncology Follow up Note  Name: Denise Watts   Date:   12/21/2023 MRN:  [id number] DOB: Nov 04, 1955    This 68 y.o. female presents to the clinic today for 36-month follow-up status post concurrent chemoradiation therapy for stage IIIb non-small cell lung cancer of the left lung and patient previous treated for breast cancer 1997 as well as adrenal carcinoma and head and neck squamous cell carcinoma of the vocal cords in 2015.  REFERRING PROVIDER: [name], DO  HPI: Patient is a 68 year old female with multiple primary cancers including some Weyman cell of the vocal cords breast cancer 1997 as well as renal cell carcinoma as well as stage IIIb non-small cell lung cancer of the left lung treated with concurrent chemoradiation therapy back in November 2024.  She is seen today in follow-up.  She is doing fair.  She continues to have dysphagia and is most of her nutrition is through the PEG tube.  She did have upper endoscopy by Dr. 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 COMPLICATIONS OF TREATMENT: none  FOLLOW UP COMPLIANCE: keeps appointments   PHYSICAL EXAM:  BP 110/73   Pulse 66   Temp 98.2 F (36.8 C) (Tympanic)   Resp 20   Wt 108 lb (49 kg)   BMI 20.41 kg/m  Thin cachectic female frail in NAD.  Well-developed well-nourished patient in NAD. HEENT reveals PERLA, EOMI, discs not visualized.  Oral cavity is clear. No oral mucosal lesions are identified. Neck is clear without evidence of cervical or supraclavicular adenopathy. Lungs are clear to A&P. Cardiac examination is essentially unremarkable with regular rate and rhythm without murmur rub or thrill. Abdomen is benign with no organomegaly or masses noted. Motor sensory and DTR levels are equal and symmetric in the upper and lower extremities. Cranial nerves II through XII are grossly intact. Proprioception is intact. No peripheral adenopathy or  edema is identified. No motor or sensory levels are noted. Crude visual fields are within normal range.  RADIOLOGY RESULTS: CT scan reviewed PET CT scan ordered  PLAN: At this time I have ordered a PET CT scan to better delineate exact extent of her metastatic disease and possibly rule out involvement of her thoracic spine or lumbar spine which may be amenable to some palliative radiation therapy.  She follows up with Dr. Melanee after the Labor Day weekend and I will see her shortly thereafter to review review results of her PET CT scan.  Patient comprehends my recommendations well.  I would like to take this opportunity to thank you for allowing me to participate in the care of your patient.SABRA Marcey Penton, MD

## 2023-12-22 ENCOUNTER — Ambulatory Visit: Admitting: Internal Medicine

## 2023-12-22 ENCOUNTER — Encounter: Payer: Self-pay | Admitting: Internal Medicine

## 2023-12-22 VITALS — BP 100/60 | HR 60 | Temp 98.1°F | Ht 61.0 in | Wt 108.0 lb

## 2023-12-22 DIAGNOSIS — J449 Chronic obstructive pulmonary disease, unspecified: Secondary | ICD-10-CM

## 2023-12-22 DIAGNOSIS — J441 Chronic obstructive pulmonary disease with (acute) exacerbation: Secondary | ICD-10-CM | POA: Diagnosis not present

## 2023-12-22 DIAGNOSIS — I2699 Other pulmonary embolism without acute cor pulmonale: Secondary | ICD-10-CM

## 2023-12-22 MED ORDER — TRELEGY ELLIPTA 100-62.5-25 MCG/ACT IN AEPB: 1.0000 | INHALATION_SPRAY | Freq: Every day | RESPIRATORY_TRACT | Status: DC

## 2023-12-22 NOTE — Patient Instructions (Addendum)
 Continue to use Trelegy 1 puff once a day Please rinse mouth after use Continue medications with Eliquis  for pulmonary embolism  Follow-up with CT chest in Dr. Melanee in the next 2 weeks  Avoid Allergens and Irritants Avoid secondhand smoke Avoid SICK contacts Recommend  Masking  when appropriate Recommend Keep up-to-date with vaccinations

## 2023-12-22 NOTE — Progress Notes (Signed)
 [facility]  CC Follow-up assessment of COPD Follow-up assessment for abnormal CT chest pulmonary nodules   Past Medical History:  Anxiety, OA, Breast cancer, Vocal cord cancer, Depression, GERD, Hiatal hernia, Parkinson disease, Restless leg syndrome  Past Surgical History:  She  has a past surgical history that includes Skin cancer excision; Knee surgery (Right); Throat surgery (1998); Esophagus surgery; Tubal ligation; Robotic adrenalectomy (Right, 12/28/2016); Ventral hernia repair (N/A, 03/03/2017); Colonoscopy (2015); Breast lumpectomy with sentinel lymph node bx (Right, 12/08/2017); Portacath placement (Right, 01/17/2018); Breast lumpectomy (Right, 1997); Port-a-cath removal; Esophagogastroduodenoscopy (egd) with propofol  (N/A, 05/24/2019); Breast reduction with mastopexy (Left, 06/10/2019); Liposuction with lipofilling (Right, 06/10/2019); Colonoscopy with propofol  (N/A, 10/29/2019); Breast biopsy (Right, 1997); Breast biopsy (Right, 11/15/2017); Breast biopsy (Right, 07/20/2020); Cataract extraction w/PHACO (Right, 02/07/2022); Cataract extraction w/PHACO (Left, 02/28/2022); Esophagogastroduodenoscopy (egd) with propofol  (N/A, 09/06/2022); Colonoscopy (N/A, 01/05/2023); Esophagogastroduodenoscopy (egd) with propofol  (01/05/2023); biopsy (01/05/2023); polypectomy (01/05/2023); Video bronchoscopy with endobronchial ultrasound (N/A, 01/25/2023); IR IMAGING GUIDED PORT INSERTION (02/07/2023); Video bronchoscopy with endobronchial ultrasound (N/A, 02/17/2023); PULMONARY THROMBECTOMY (Bilateral, 05/12/2023); Gastrostomy (N/A, 05/25/2023); and IR Replc Gastro/Colonic Tube Percut W/Fluoro (09/12/2023).  Brief Summary:  Denise Watts is a 67 y.o. female former smoker with chronic cough.      Subjective:  Assessment of COPD No exacerbation at this time No evidence of heart failure at this time No evidence or signs of infection at this time No respiratory  distress No fevers, chills, nausea, vomiting, diarrhea No evidence of lower extremity edema No evidence hemoptysis' Continue Trelegy as prescribed   Follow-up CT chest in April 2025 No significant changes from previous CT scan Follow-up CT scan ordered by Dr. Melanee Oncology    Physical Exam:   BP 100/60   Pulse 60   Temp 98.1 F (36.7 C)   Ht 5' 1 (1.549 m)   Wt 108 lb (49 kg)   SpO2 95%   BMI 20.41 kg/m     Physical Examination:   General Appearance: No distress  EYES PERRLA, EOM intact.   NECK Supple, No JVD Pulmonary: normal breath sounds, No wheezing.  CardiovascularNormal S1,S2.  No m/r/g.   Abdomen: Benign, Soft, non-tender. Neurology UE/LE 5/5 strength, no focal deficits Ext pulses intact, cap refill intact ALL OTHER ROS ARE NEGATIVE     Pulmonary testing:    Chest Imaging:  CT chest 08/14/19 >> centrilobular emphysema, scarring RUL, 3 mm nodule RML, 3 mm nodule RLL, mild GGO 1.2 x 0.9 cm RLL, 4 mm nodule LUL, 1.1 x 1.2 GGO LUL, 1.2 x 1 cm nodule LLL CT chest 08/21/20 >> moderate centrilobular emphysema, stable nodules except new LLL 8 mm GGO CT chest JAN 2025-+PE, Segmental and subsegmental pulmonary emboli in the right lower  Positive for acute PE  Similar appearance of multiple pulmonary nodules Pulmonary function test 2024 March FEV1 FVC ratio 60% predicted FEV1 62% predicted Moderate obstructive lung disease  Social History:  She  reports that she quit smoking about 28 years ago. Her smoking use included cigarettes. She started smoking about 58 years ago. She has a 45 pack-year smoking history. She has been exposed to tobacco smoke. She has never used smokeless tobacco. She reports that she does not currently use alcohol after a past usage of about 4.0 standard drinks of alcohol per week. She reports that she does not use drugs.  Family History:  Her family history includes Cancer in her paternal aunt, paternal grandmother, and sister; Cancer (age  of  onset: 59) in her sister; Diabetes in her mother; Heart attack in her mother.     Assessment/Plan:   68 y.o. female with history of head and neck, breast cancer as well as adrenal carcinoma of unknown primary in the past now with non-small cell carcinoma of unknown primary predominantly with mediastinal adenopathy. She has  being treated as possible non-small cell lung cancer. She is s/p concurrent chemoradiation and here for routine follow-up , patient with diagnosis of pulmonary embolism January 2025 Segmental and subsegmental pulmonary emboli in the right lower lobe pulmonary arteries     Assessment of COPD Stable at this time No exacerbation at this time No evidence of heart failure at this time No evidence or signs of infection at this time No respiratory distress No fevers, chills, nausea, vomiting, diarrhea No evidence of lower extremity edema No evidence hemoptysis Trelegy inhaler 100 Avoid Allergens and Irritants Avoid secondhand smoke Avoid SICK contacts Recommend  Masking  when appropriate Recommend Keep up-to-date with vaccinations    Lung nodules extensive oncological history CT chest August 15, 2023 reviewed in detail Repeat CT chest April 2025 did not show any significant changes in pulmonary nodules Follow-up with oncology on SEPT 5 with follow-up CT chest pending   History of breast cancer and vocal cord cancer. - followed by Dr. Annah Skene with [facility] in [place]  Acute PE diagnosed January 2025 Patient was unaware of her diagnosis Continue anticoagulation as prescribed Patient asked for refill which I have provided  Medication List:   Allergies as of 12/22/2023       Reactions   Sulfa  Antibiotics Other (See Comments)   Mouth sore and raw        Medication List        Accurate as of December 22, 2023 11:45 AM. If you have any questions, ask your nurse or doctor.          albuterol  108 (90 Base) MCG/ACT  inhaler Commonly known as: VENTOLIN  HFA Inhale 2 puffs into the lungs every 6 (six) hours as needed for wheezing or shortness of breath.   apixaban  5 MG Tabs tablet Commonly known as: Eliquis  Take 1 tablet (5 mg total) by mouth 2 (two) times daily.   benzonatate  100 MG capsule Commonly known as: TESSALON  Take 2 capsules (200 mg total) by mouth 3 (three) times daily as needed for cough.   bisacodyl  10 MG suppository Commonly known as: DULCOLAX Place 1 suppository (10 mg total) rectally daily as needed for moderate constipation or severe constipation.   busPIRone  5 MG tablet Commonly known as: BUSPAR  Take 1 tablet (5 mg total) by mouth 3 (three) times daily as needed.   cefdinir  300 MG capsule Commonly known as: OMNICEF  Take 1 capsule (300 mg total) by mouth 2 (two) times daily.   DULoxetine  60 MG capsule Commonly known as: Cymbalta  Take 1 capsule (60 mg total) by mouth daily.   fentaNYL  12 MCG/HR Commonly known as: DURAGESIC  Place 1 patch onto the skin every 3 (three) days.   free water  Soln Place 60 mLs into feeding tube 5 (five) times daily.   lactulose  (encephalopathy) 10 GM/15ML Soln Commonly known as: CHRONULAC  Place 30 mLs (20 g total) into feeding tube 2 (two) times daily.   lidocaine  5 % Commonly known as: LIDODERM  Place 1 patch onto the skin daily. Remove & Discard patch within 12 hours or as directed by MD   meloxicam  7.5 MG tablet Commonly known as: MOBIC  Take 1  tablet (7.5 mg total) by mouth daily.   metoprolol  succinate 25 MG 24 hr tablet Commonly known as: TOPROL -XL TAKE 1 TABLET BY MOUTH DAILY   montelukast  10 MG tablet Commonly known as: SINGULAIR  TAKE 1 TABLET BY MOUTH AT BEDTIME   Nutren 1.5 Liqd Give 1 carton 4 times a day (8am, noon, 4pm and 8pm). Flush with 60ml of water  before and after each feeding.  Give additional 1 1/2 cups of water  a day via tube or drink orally for adequate hydration.   omeprazole  2 mg/mL Susp oral  suspension Commonly known as: KONVOMEP  Take 20 mLs (40 mg total) by mouth daily.   ondansetron  4 MG tablet Commonly known as: Zofran  Take 1 tablet (4 mg total) by mouth every 6 (six) hours as needed for nausea or vomiting.   oxyCODONE  5 MG/5ML solution Commonly known as: ROXICODONE  Take 5 mLs (5 mg total) by mouth every 8 (eight) hours as needed for severe pain (pain score 7-10).   predniSONE  20 MG tablet Commonly known as: DELTASONE  Take 1 tablet (20 mg total) by mouth daily with breakfast. 7 days   rOPINIRole  0.5 MG tablet Commonly known as: REQUIP  Take 1 tablet (0.5 mg total) by mouth 3 (three) times daily.   rosuvastatin  40 MG tablet Commonly known as: CRESTOR  Place 1 tablet (40 mg total) into feeding tube daily.   senna 8.6 MG Tabs tablet Commonly known as: SENOKOT Place 1 tablet (8.6 mg total) into feeding tube daily.   simethicone  80 MG chewable tablet Commonly known as: MYLICON Place 1 tablet (80 mg total) into feeding tube 4 (four) times daily.   sucralfate  1 GM/10ML suspension Commonly known as: CARAFATE  Place 10 mLs (1 g total) into feeding tube 4 (four) times daily.   tolterodine  4 MG 24 hr capsule Commonly known as: DETROL  LA Take 4 mg by mouth daily.   Trelegy Ellipta  100-62.5-25 MCG/ACT Aepb Generic drug: Fluticasone -Umeclidin-Vilant Inhale 1 Act into the lungs daily.   triamcinolone  cream 0.1 % Commonly known as: KENALOG  Apply 1 Application topically 2 (two) times daily.        MEDICATION ADJUSTMENTS/LABS AND TESTS ORDERED: TRELEGY one puff once per day Please rinse mouth after every use Continue Medication for Blood Clot Eliquis  Avoid Allergens and Irritants Avoid secondhand smoke Avoid SICK contacts Recommend  Masking  when appropriate Recommend Keep up-to-date with vaccinations Follow up with Dr Melanee Continue anticoagulation for pulmonary embolism  CURRENT MEDICATIONS REVIEWED AT LENGTH WITH PATIENT TODAY   Patient  satisfied with  Plan of action and management. All questions answered   Follow up 3 months   I spent a total of 42 minutes dedicated to the care of this patient on the date of this encounter to include pre-visit review of records, face-to-face time with the patient discussing conditions above, post visit ordering of testing, clinical documentation with the electronic health record, making appropriate referrals as documented, and communicating necessary information to the patient's healthcare team.    The Patient requires high complexity decision making for assessment and support, frequent evaluation and titration of therapies, application of advanced monitoring technologies and extensive interpretation of multiple databases.  Patient satisfied with Plan of action and management. All questions answered    Nickolas Alm Cellar, M.D.  [facility]  Medical Director [facility] Medical Director [facility] Cardio-Pulmonary Department

## 2023-12-25 ENCOUNTER — Inpatient Hospital Stay

## 2023-12-25 ENCOUNTER — Telehealth: Payer: Self-pay | Admitting: Oncology

## 2023-12-25 NOTE — Telephone Encounter (Signed)
 Pt called to r/s injection from today to tomorrow. Appt r/s  Pt wants to know if her prescription has been called in. Please call pt back and let her know, thank you

## 2023-12-26 ENCOUNTER — Other Ambulatory Visit: Payer: Self-pay

## 2023-12-26 ENCOUNTER — Inpatient Hospital Stay: Attending: Oncology

## 2023-12-26 DIAGNOSIS — E538 Deficiency of other specified B group vitamins: Secondary | ICD-10-CM | POA: Diagnosis not present

## 2023-12-26 DIAGNOSIS — Z23 Encounter for immunization: Secondary | ICD-10-CM

## 2023-12-26 MED ORDER — CYANOCOBALAMIN 1000 MCG/ML IJ SOLN
1000.0000 ug | INTRAMUSCULAR | Status: DC
  Administered 2023-12-26: 1000 ug via INTRAMUSCULAR
  Filled 2023-12-26: qty 1

## 2023-12-26 MED ORDER — OXYCODONE HCL 5 MG/5ML PO SOLN: 5.0000 mg | Freq: Three times a day (TID) | ORAL | 0 refills | Status: DC | PRN

## 2023-12-26 NOTE — Telephone Encounter (Signed)
 Outbound call to patient to clarify which prescription has been called in.  Patient clarified it's for the oxycodone  liquid; informed it would have to be submitted to Dr. Melanee for approval but she should be hearing from the pharmacy later today.  When attempting to put in script to pend for review it indicated there is an existing script pending for oxycodone  already submitted earlier today.

## 2023-12-27 ENCOUNTER — Other Ambulatory Visit: Payer: Self-pay | Admitting: Oncology

## 2023-12-27 ENCOUNTER — Ambulatory Visit
Admission: RE | Admit: 2023-12-27 | Discharge: 2023-12-27 | Disposition: A | Discharge status: Home / Self Care | Source: Ambulatory Visit | Attending: Radiation Oncology | Admitting: Radiation Oncology

## 2023-12-27 DIAGNOSIS — I2510 Atherosclerotic heart disease of native coronary artery without angina pectoris: Secondary | ICD-10-CM | POA: Insufficient documentation

## 2023-12-27 DIAGNOSIS — C3490 Malignant neoplasm of unspecified part of unspecified bronchus or lung: Secondary | ICD-10-CM | POA: Diagnosis not present

## 2023-12-27 DIAGNOSIS — M899 Disorder of bone, unspecified: Secondary | ICD-10-CM | POA: Diagnosis not present

## 2023-12-27 DIAGNOSIS — C7951 Secondary malignant neoplasm of bone: Secondary | ICD-10-CM | POA: Diagnosis not present

## 2023-12-27 DIAGNOSIS — R918 Other nonspecific abnormal finding of lung field: Secondary | ICD-10-CM | POA: Diagnosis not present

## 2023-12-27 DIAGNOSIS — R590 Localized enlarged lymph nodes: Secondary | ICD-10-CM | POA: Diagnosis not present

## 2023-12-27 DIAGNOSIS — I700 Atherosclerosis of aorta: Secondary | ICD-10-CM | POA: Insufficient documentation

## 2023-12-27 LAB — GLUCOSE, CAPILLARY: Glucose-Capillary: 108 mg/dL — ABNORMAL HIGH (ref 70–99)

## 2023-12-27 MED ORDER — FLUDEOXYGLUCOSE F - 18 (FDG) INJECTION
5.6000 | Freq: Once | INTRAVENOUS | Status: AC | PRN
  Administered 2023-12-27: 5.33 via INTRAVENOUS

## 2023-12-27 NOTE — Telephone Encounter (Signed)
 Oxycodone  filled yesterday Receipt confirmed by pharmacy (12/26/2023  3:59 PM EDT).  Outbound call; informed patient of above. Patient states she has not received a call from [facility] as of yet; advised to follow up if she doesn't hear from them soon.

## 2024-01-03 ENCOUNTER — Other Ambulatory Visit: Payer: Self-pay | Admitting: Family Medicine

## 2024-01-03 ENCOUNTER — Ambulatory Visit: Admitting: Radiation Oncology

## 2024-01-04 ENCOUNTER — Ambulatory Visit
Admission: RE | Admit: 2024-01-04 | Discharge: 2024-01-04 | Disposition: A | Discharge status: Home / Self Care | Source: Ambulatory Visit | Attending: Radiation Oncology | Admitting: Radiation Oncology

## 2024-01-04 ENCOUNTER — Encounter: Payer: Self-pay | Admitting: Radiation Oncology

## 2024-01-04 VITALS — BP 124/77 | HR 76 | Temp 97.4°F | Resp 16

## 2024-01-04 DIAGNOSIS — Z853 Personal history of malignant neoplasm of breast: Secondary | ICD-10-CM | POA: Diagnosis not present

## 2024-01-04 DIAGNOSIS — C3412 Malignant neoplasm of upper lobe, left bronchus or lung: Secondary | ICD-10-CM | POA: Diagnosis not present

## 2024-01-04 DIAGNOSIS — C3490 Malignant neoplasm of unspecified part of unspecified bronchus or lung: Secondary | ICD-10-CM | POA: Insufficient documentation

## 2024-01-04 DIAGNOSIS — Z87891 Personal history of nicotine dependence: Secondary | ICD-10-CM | POA: Diagnosis not present

## 2024-01-04 NOTE — Progress Notes (Signed)
 Radiation Oncology Follow up Note  Name: Denise Watts   Date:   01/04/2024 MRN:  [id number] DOB: 1955/05/22    This 68 y.o. 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  Her most pressing problem is her dysphagia she is currently on a gastrostomy (PEG).  Tube.  I ordered a PET CT scan which does show overall disease progression as evidenced by new mediastinal adenopathy widespread pulmonary nodules and new osseous lesions.  She did have metastatic disease in her lateral left ninth rib as well as left scapula although those areas are not causing pain her major discomfort is around her PEG tube insertion.  COMPLICATIONS OF TREATMENT: none  FOLLOW UP COMPLIANCE: keeps appointments   PHYSICAL EXAM:  BP 124/77   Pulse 76   Temp (!) 97.4 F (36.3 C) (Tympanic)   Resp 16  Frail-appearing female in NAD.  Range of motion of her upper extremities does not elicit pain deep palpation of her scapula and spine does not elicit pain.  Well-developed well-nourished patient in NAD. HEENT reveals PERLA, EOMI, discs not visualized.  Oral cavity is clear. No oral mucosal lesions are identified. Neck is clear without evidence of cervical or supraclavicular adenopathy. Lungs are clear to A&P. Cardiac examination is essentially unremarkable with regular rate and rhythm without murmur rub or thrill. Abdomen is benign with no organomegaly or masses noted. Motor sensory and DTR  levels are equal and symmetric in the upper and lower extremities. Cranial nerves II through XII are grossly intact. Proprioception is intact. No peripheral adenopathy or edema is identified. No motor or sensory levels are noted. Crude visual fields are within normal range.  RADIOLOGY RESULTS: PET scan reviewed compatible with above-stated findings  PLAN: At this time patient is seeing medical oncology Dr. Melanee tomorrow.  I believe she would benefit from systemic treatment or immunotherapy if that is applicable.  I do not see any areas that would warrant palliative radiation at this time.  I will turn follow-up care over to medical oncology would happy to reevaluate her anytime should that be indicated.  Patient knows to call with any concern.  I would like to take this opportunity to thank you for allowing me to participate in the care of your patient.SABRA Marcey Penton, MD

## 2024-01-05 ENCOUNTER — Encounter

## 2024-01-05 ENCOUNTER — Inpatient Hospital Stay: Attending: Oncology | Admitting: Oncology

## 2024-01-05 ENCOUNTER — Inpatient Hospital Stay

## 2024-01-05 ENCOUNTER — Other Ambulatory Visit: Payer: Self-pay

## 2024-01-05 VITALS — BP 109/64 | HR 76 | Temp 96.6°F | Resp 16 | Ht 61.0 in | Wt 109.5 lb

## 2024-01-05 DIAGNOSIS — Z8521 Personal history of malignant neoplasm of larynx: Secondary | ICD-10-CM | POA: Insufficient documentation

## 2024-01-05 DIAGNOSIS — C7951 Secondary malignant neoplasm of bone: Secondary | ICD-10-CM | POA: Diagnosis not present

## 2024-01-05 DIAGNOSIS — G893 Neoplasm related pain (acute) (chronic): Secondary | ICD-10-CM | POA: Insufficient documentation

## 2024-01-05 DIAGNOSIS — Z7189 Other specified counseling: Secondary | ICD-10-CM

## 2024-01-05 DIAGNOSIS — R590 Localized enlarged lymph nodes: Secondary | ICD-10-CM | POA: Diagnosis not present

## 2024-01-05 DIAGNOSIS — C3491 Malignant neoplasm of unspecified part of right bronchus or lung: Secondary | ICD-10-CM | POA: Diagnosis not present

## 2024-01-05 DIAGNOSIS — E538 Deficiency of other specified B group vitamins: Secondary | ICD-10-CM | POA: Insufficient documentation

## 2024-01-05 DIAGNOSIS — Z9221 Personal history of antineoplastic chemotherapy: Secondary | ICD-10-CM

## 2024-01-05 DIAGNOSIS — R918 Other nonspecific abnormal finding of lung field: Secondary | ICD-10-CM | POA: Diagnosis not present

## 2024-01-05 DIAGNOSIS — C3492 Malignant neoplasm of unspecified part of left bronchus or lung: Secondary | ICD-10-CM

## 2024-01-05 DIAGNOSIS — Z923 Personal history of irradiation: Secondary | ICD-10-CM | POA: Insufficient documentation

## 2024-01-05 DIAGNOSIS — Z87891 Personal history of nicotine dependence: Secondary | ICD-10-CM | POA: Diagnosis not present

## 2024-01-05 DIAGNOSIS — Z85819 Personal history of malignant neoplasm of unspecified site of lip, oral cavity, and pharynx: Secondary | ICD-10-CM | POA: Insufficient documentation

## 2024-01-05 DIAGNOSIS — Z853 Personal history of malignant neoplasm of breast: Secondary | ICD-10-CM | POA: Diagnosis not present

## 2024-01-05 DIAGNOSIS — C801 Malignant (primary) neoplasm, unspecified: Secondary | ICD-10-CM

## 2024-01-05 DIAGNOSIS — C7800 Secondary malignant neoplasm of unspecified lung: Secondary | ICD-10-CM

## 2024-01-05 DIAGNOSIS — Z808 Family history of malignant neoplasm of other organs or systems: Secondary | ICD-10-CM | POA: Insufficient documentation

## 2024-01-05 LAB — CBC WITH DIFFERENTIAL (CANCER CENTER ONLY)
Abs Immature Granulocytes: 0.02 K/uL (ref 0.00–0.07)
Basophils Absolute: 0 K/uL (ref 0.0–0.1)
Basophils Relative: 0 %
Eosinophils Absolute: 0.2 K/uL (ref 0.0–0.5)
Eosinophils Relative: 4 %
HCT: 40.1 % (ref 36.0–46.0)
Hemoglobin: 12.9 g/dL (ref 12.0–15.0)
Immature Granulocytes: 0 %
Lymphocytes Relative: 14 %
Lymphs Abs: 0.7 K/uL (ref 0.7–4.0)
MCH: 31.6 pg (ref 26.0–34.0)
MCHC: 32.2 g/dL (ref 30.0–36.0)
MCV: 98.3 fL (ref 80.0–100.0)
Monocytes Absolute: 0.5 K/uL (ref 0.1–1.0)
Monocytes Relative: 10 %
Neutro Abs: 3.6 K/uL (ref 1.7–7.7)
Neutrophils Relative %: 72 %
Platelet Count: 175 K/uL (ref 150–400)
RBC: 4.08 MIL/uL (ref 3.87–5.11)
RDW: 12.7 % (ref 11.5–15.5)
WBC Count: 5.1 K/uL (ref 4.0–10.5)
nRBC: 0 % (ref 0.0–0.2)

## 2024-01-05 LAB — CMP (CANCER CENTER ONLY)
ALT: 48 U/L — ABNORMAL HIGH (ref 0–44)
AST: 48 U/L — ABNORMAL HIGH (ref 15–41)
Albumin: 3.6 g/dL (ref 3.5–5.0)
Alkaline Phosphatase: 72 U/L (ref 38–126)
Anion gap: 8 (ref 5–15)
BUN: 25 mg/dL — ABNORMAL HIGH (ref 8–23)
CO2: 25 mmol/L (ref 22–32)
Calcium: 9 mg/dL (ref 8.9–10.3)
Chloride: 104 mmol/L (ref 98–111)
Creatinine: 0.69 mg/dL (ref 0.44–1.00)
GFR, Estimated: >60 mL/min (ref >60)
Glucose, Bld: 74 mg/dL (ref 70–99)
Potassium: 4.3 mmol/L (ref 3.5–5.1)
Sodium: 137 mmol/L (ref 135–145)
Total Bilirubin: 0.5 mg/dL (ref 0.0–1.2)
Total Protein: 6.5 g/dL (ref 6.5–8.1)

## 2024-01-05 NOTE — Progress Notes (Signed)
 Patient feels weak and tired. Hurting all over 5/10; just took medicine a couple of hours ago and is waiting for it to kick in. Chronic problems swallowing foods, says it gets hung in her throat.  Gets sick to stomach every morning, denies vomiting.  Just can't hold it (urine); has to wear pads, is usually soaked by the time she makes it to the bathroom. Says she takes medication for this but it might not be strong enough.  SOB w/ exertion.

## 2024-01-06 ENCOUNTER — Encounter: Payer: Self-pay | Admitting: Oncology

## 2024-01-06 NOTE — Progress Notes (Signed)
 Hematology/Oncology Consult note [facility]  [phone] [phone]  Patient Care Team: [name], DO as PCP - General (Family Medicine) Darliss Rogue, MD as PCP - Cardiology (Cardiology) Melanee Annah BROCKS, MD as Consulting Physician (Oncology) Jordis Laneta FALCON, MD as Consulting Physician (General Surgery) Dasher, Alm LABOR, MD (Dermatology) Scheeler, Donnice PARAS, PA-C as Physician Assistant (Plastic Surgery) Dorothyann Drivers, MD as Attending Physician (Emergency Medicine) Ferrel, Lionel Sotero RIGGERS (Neurology) French Mering, MD as Referring Physician (Internal Medicine) Shellia Oh, MD (Inactive) as Consulting Physician (Pulmonary Disease) Pa, [facility] (Optometry) Verdene Gills, RN as Oncology Nurse Navigator Lenn Aran, MD as Consulting Physician (Radiation Oncology) Moises Reusing, RN as [facility], [place], LCSW as [facility] [place], [place], [place]   Name of the patient: Denise Watts  [id number]  1956/03/04   Date of visit: 01/06/24  Diagnosis-non small cell carcinoma of unknown primary  Chief complaint/ Reason for visit-discuss CT scan results and further management  Heme/Onc history:  Oncology History Overview Note  1. Carcinoma of breast, T1, N0, M0 tumor diagnosis.  In January of 1997 2. Carcinoma of the vocal cord T1, N0, M0 tumor.  Status postradiation therapy 3. Abnormal CT scan of the chest (December, 2015) 4. Repeat CT scan of chest shows stable nodule (July, 2016) 5. Neutropenia with normal hemoglobin and normal platelet count (July, 2016)   Cancer of vocal cord [facility]) (Resolved)  11/10/2014 Initial Diagnosis   Cancer of vocal cord   Malignant neoplasm of lower-outer quadrant of right breast of female, estrogen receptor negative (HCC)  11/24/2017 Initial Diagnosis   Malignant neoplasm of lower-outer quadrant of right breast of female, estrogen receptor  negative (HCC)   01/12/2018 Cancer Staging   Staging form: Breast, AJCC 8th Edition - Clinical stage from 01/12/2018: Stage IB (cT1a, cN0, cM0, G3, ER+, PR-, HER2-) - Signed by Melanee Annah BROCKS, MD on 01/12/2018   01/30/2018 - 04/26/2018 Chemotherapy   The patient had dexamethasone  (DECADRON ) 4 MG tablet, 8 mg, Oral, 2 times daily, 1 of 1 cycle, Start date: 01/12/2018, End date: 08/03/2018 palonosetron  (ALOXI ) injection 0.25 mg, 0.25 mg, Intravenous,  Once, 4 of 4 cycles Administration: 0.25 mg (01/30/2018), 0.25 mg (02/27/2018), 0.25 mg (03/28/2018), 0.25 mg (04/26/2018) pegfilgrastim  (NEULASTA  ONPRO KIT) injection 6 mg, 6 mg, Subcutaneous, Once, 4 of 4 cycles Administration: 6 mg (01/30/2018), 6 mg (02/27/2018), 6 mg (03/28/2018), 6 mg (04/26/2018) cyclophosphamide  (CYTOXAN ) 1,000 mg in sodium chloride  0.9 % 250 mL chemo infusion, 600 mg/m2 = 1,000 mg, Intravenous,  Once, 4 of 4 cycles Administration: 1,000 mg (01/30/2018), 1,000 mg (02/27/2018), 1,000 mg (03/28/2018), 1,000 mg (04/26/2018) DOCEtaxel  (TAXOTERE ) 130 mg in sodium chloride  0.9 % 250 mL chemo infusion, 75 mg/m2 = 130 mg, Intravenous,  Once, 4 of 4 cycles Dose modification: 60 mg/m2 (original dose 75 mg/m2, Cycle 2, Reason: Dose not tolerated) Administration: 130 mg (01/30/2018), 100 mg (02/27/2018), 100 mg (03/28/2018), 100 mg (04/26/2018)  for chemotherapy treatment.    Non-small cell lung cancer (HCC)  03/09/2023 Initial Diagnosis   Non-small cell lung cancer (HCC)   03/20/2023 -  Chemotherapy   Patient is on Treatment Plan : LUNG Carboplatin  + Paclitaxel  q7d       The patient was originally diagnosed with right breast cancer in 1997, 10 mm grade 3, ER pr positive her 2 negative, node negative status post surgery and radiation treatment only. She never received chemotherapy or hormone therapy. For her vocal cord  cancer, she had extensive surgery and radiation therapy in 1998. Due to past history of smoking, she was also noted to have  lung nodules. She is undergoing active surveillance imaging study of the chest.   Patient noted to have adrenal mass on CT chest which prompted PET CT in July 2018 which showed: IMPRESSION: 1. Right adrenal mass with peripheral hypermetabolism. Given interval development since 11/18/2015, suspicious for either isolated metastasis or a primary adrenal neoplasm. This should be considered for biopsy. 2. No evidence of hypermetabolic primary malignancy or extraadrenal metastasis. Pulmonary nodules are not significantly hypermetabolic. 3. Coronary artery atherosclerosis. Aortic Atherosclerosis (ICD10-I70.0). 4. Hepatic steatosis. 5. Left sixth rib hypermetabolism is favored to be related to remote Trauma.   Patient underwent right adrenalectomy which showed: DIAGNOSIS:  A. ADRENAL GLAND, RIGHT; ADRENALECTOMY:  - HIGH GRADE CARCINOMA WITH EXTENSIVE NECROSIS.  - MARGINS OF THE SPECIMEN ARE NEGATIVE FOR CARCINOMA.   Comment:  Sections demonstrate abundant necrosis confined to the adrenal medullary  compartment. In one block of tissue a small fragment of viable malignant  neoplasm was identified. A panel of immunohistochemical stains was  performed with the following pattern of immunoreactivity:  Super pancytokeratin: Positive  GATA-3: Negative  TTF-1: Negative  P40: Negative  Napsin: Negative  PAX-8: Negative  Chromogranin: Negative     Repeat CT chest abdomen and pelvis in July 2019 showed stable left lower lobe pulmonary nodule.  No other evidence of metastatic disease.  New 4.5 mm nodule in the right breast.  This was followed by ultrasound mammogram and core biopsy of the breast lesion which was a grade 3 triple negative breast cancer.  Patient was seen by Dr. Dessa and underwent lumpectomy on 12/08/2017.   Patient had a 5 mm weakly ER positive tumor.  Given her ongoing fatigue Oncotype testing was done to see if she could potentially avoid chemotherapy.  Oncotype shows a recurrence  score of 53 with her risk of distant recurrence at 9 years at greater than 39% with hormone therapy alone.  Absolute benefit of chemotherapy was greater than 15% in her age group.  Patient completed 4 cycles of adjuvant TC chemotherapy in December 2019    Patient has known bilateral pulmonary nodules which have been monitored and she was again found to have a left lower lobe lung nodule measuring 13 mm unchanged as compared to prior exams.  There were also new subcentimeter lung nodules detected and follow-up CT was recommended.  CT angio chest in August 2024 showed increase in the size of left upper lobe lung nodule and new multiple pulmonary nodules concerning for metastatic disease.  This was followed by a PET CT scan which showed hypermetabolic intrathoracic nodal adenopathy measuring 5 to 13 mm in size along with lung nodules with a maximum size measuring 13 mm with an SUV of 9.2.   CT-guided biopsy was nondiagnostic she is patient underwent bronchoscopy but lymph nodes were not sampled at that time due to equipment malfunction.Bronchial lavage from left upper lobe was positive for malignancy non-small cell carcinoma.  But no further subclassification whether this is primary lung cancer or metastatic carcinoma could be done due to limited specimen. EBUS station 7 LN also shows non small cell carcinoma. Patient then completed 4 weekly cycles of chemotherapy which was also stopped prematurely due to poor tolerance  Interval history-patient reports generalized fatigue.  Oral intake is fair.  She has chest wall and back pain for which she is using as needed oxycodone   ECOG PS-  2 Pain scale- 5   Review of systems- Review of Systems  Constitutional:  Positive for malaise/fatigue. Negative for chills, fever and weight loss.  HENT:  Negative for congestion, ear discharge and nosebleeds.   Eyes:  Negative for blurred vision.  Respiratory:  Negative for cough, hemoptysis, sputum production, shortness of  breath and wheezing.   Cardiovascular:  Negative for chest pain, palpitations, orthopnea and claudication.  Gastrointestinal:  Negative for abdominal pain, blood in stool, constipation, diarrhea, heartburn, melena, nausea and vomiting.  Genitourinary:  Negative for dysuria, flank pain, frequency, hematuria and urgency.  Musculoskeletal:  Positive for back pain, joint pain and myalgias.  Skin:  Negative for rash.  Neurological:  Negative for dizziness, tingling, focal weakness, seizures, weakness and headaches.  Endo/Heme/Allergies:  Does not bruise/bleed easily.  Psychiatric/Behavioral:  Negative for depression and suicidal ideas. The patient does not have insomnia.       Allergies  Allergen Reactions   Sulfa  Antibiotics Other (See Comments)    Mouth sore and raw     Past Medical History:  Diagnosis Date   Acute pulmonary embolism (HCC) 05/10/2023   Adenomatous colon polyp    Adrenal carcinoma, right (HCC) 12/28/2016   a.) high grade carcinoma with extensive necrosis --> s/p adrenalectomy   Anxiety    Aortic atherosclerosis (HCC)    Arthritis of right knee    Atrial fibrillation and flutter (HCC)    a.) CHA2DS2-VASc = 3 (age, sex, vascular disease history) as of 02/13/2023; b.) cardiac rate/rhythm maintained intrinsically without pharmacological intervention; no chronic OAC (does take low dose ASA)   B12 deficiency    Breast cancer (HCC) 1997   a.) stage IB (cT1a, cN0, cM0, G3, ER+, PR-, HER2-); s/p lumpectomy + XRT   COPD (chronic obstructive pulmonary disease) (HCC)    Coronary artery disease    a.) cCTA 12/09/2021: Ca2+ = 135 (80th %'ile for age/sex/race match contol) --> 25-49% pLAD and RCA distributions   Depression    Diastolic dysfunction    a.) TTE 12/11/2020, no RWMAs, norm RVSF, G1DD; b.) TTE 12/14/2021: EF 55-60, no RWMAs, norm RVSF, G1DD; c.) TTE 01/27/2023: EF 50-55%, no RWMAs, G1DD, mild RAE, mild MR, mod TR   Dry cough 03/02/2017   Dyspnea    Full dentures     GERD (gastroesophageal reflux disease)    History of hiatal hernia    HLD (hyperlipidemia)    Invasive carcinoma of breast (HCC) 11/15/2017   a.) path (+) for G3, ER -, PR -, HER2/neu - ; s/p lumpectomy 01/17/2018 + 4 cycles TC systemic chemotherapy + XRT   Long term current use of aspirin     Personal history of chemotherapy    Personal history of radiation therapy    Pneumothorax of left lung after biopsy 01/25/2023   a.) hypoxic in PACU requiring re-intubation --> transferred to ICU --> chest tube placed by PCCM   RLS (restless legs syndrome)    a.) on ropinirole    Skin cancer    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   a.) Stage IB (cT1a, cN0, cM0, G3, ER+, PR-, HER2-)     Past Surgical History:  Procedure Laterality Date   BIOPSY  01/05/2023   Procedure: BIOPSY;  Surgeon: Unk Corinn Skiff, MD;  Location: [facility] END[facility]COPY;  Service: Gastroenterology;;   BREAST BIOPSY Right 1997   positive   BREAST BIOPSY Right 11/15/2017   INVASIVE MAMMARY CARCINOMA triple negative   BREAST BIOPSY Right 07/20/2020   us  bx, Q  marker, neg   BREAST LUMPECTOMY Right 1997   2019 also   BREAST LUMPECTOMY WITH SENTINEL LYMPH NODE BIOPSY Right 12/08/2017   Procedure: BREAST LUMPECTOMY WITH SENTINEL LYMPH NODE BX;  Surgeon: Dessa Reyes ORN, MD;  Location: [facility] ORS;  Service: General;  Laterality: Right;   BREAST REDUCTION WITH MASTOPEXY Left 06/10/2019   Procedure: Left breast mastopexy reduction for symmetry;  Surgeon: Lowery Estefana RAMAN, DO;  Location: [facility];  Service: Plastics;  Laterality: Left;  total 2.5 hours   CATARACT EXTRACTION W/PHACO Right 02/07/2022   Procedure: CATARACT EXTRACTION PHACO AND INTRAOCULAR LENS PLACEMENT (IOC) RIGHT DIABETIC;  Surgeon: Myrna Adine Anes, MD;  Location: [facility];  Service: Ophthalmology;  Laterality: Right;  3.60 00:34.7   CATARACT EXTRACTION W/PHACO Left 02/28/2022   Procedure: CATARACT EXTRACTION PHACO AND  INTRAOCULAR LENS PLACEMENT (IOC) LEFT DIABETIC 4.11 00:26.1;  Surgeon: Myrna Adine Anes, MD;  Location: [facility];  Service: Ophthalmology;  Laterality: Left;   COLONOSCOPY  2015   COLONOSCOPY N/A 01/05/2023   Procedure: COLONOSCOPY;  Surgeon: Unk Corinn Skiff, MD;  [facility];  Service: Gastroenterology;  Laterality: N/A;   COLONOSCOPY WITH PROPOFOL  N/A 10/29/2019   Procedure: COLONOSCOPY WITH PROPOFOL ;  Surgeon: Jinny Carmine, MD;  Location: [facility]COPY;  Service: Endoscopy;  Laterality: N/A;   ESOPHAGOGASTRODUODENOSCOPY (EGD) WITH PROPOFOL  N/A 05/24/2019   Procedure: ESOPHAGOGASTRODUODENOSCOPY (EGD) WITH PROPOFOL ;  Surgeon: Jinny Carmine, MD;  Location: [facility];  Service: Endoscopy;  Laterality: N/A;   ESOPHAGOGASTRODUODENOSCOPY (EGD) WITH PROPOFOL  N/A 09/06/2022   Procedure: ESOPHAGOGASTRODUODENOSCOPY (EGD) WITH PROPOFOL ;  Surgeon: Jinny Carmine, MD;  Location: [facility];  Service: Endoscopy;  Laterality: N/A;   ESOPHAGOGASTRODUODENOSCOPY (EGD) WITH PROPOFOL   01/05/2023   Procedure: ESOPHAGOGASTRODUODENOSCOPY (EGD) WITH PROPOFOL ;  Surgeon: Unk Corinn Skiff, MD;  Location: [facility];  Service: Gastroenterology;;   ESOPHAGUS SURGERY     GASTROSTOMY N/A 05/25/2023   Procedure: OPEN INSERTION OF GASTROSTOMY TUBE;  Surgeon: Jordis Laneta FALCON, MD;  Location: [facility];  Service: General;  Laterality: N/A;   IR IMAGING GUIDED PORT INSERTION  02/07/2023   IR REPLC GASTRO/COLONIC TUBE PERCUT W/FLUORO  09/12/2023   KNEE SURGERY Right    LIPOSUCTION WITH LIPOFILLING Right 06/10/2019   Procedure: right breast scar and capsule release with fat grafting;  Surgeon: Lowery Estefana RAMAN, DO;  Location: [facility];  Service: Plastics;  Laterality: Right;   POLYPECTOMY  01/05/2023   Procedure: POLYPECTOMY;  Surgeon: Unk Corinn Skiff, MD;  Location: [facility] END[facility]COPY;  Service: Gastroenterology;;   [facility] REMOVAL     PORTACATH PLACEMENT Right 01/17/2018    Procedure: INSERTION PORT-A-CATH- RIGHT;  Surgeon: Dessa Reyes ORN, MD;  Location: [facility];  Service: General;  Laterality: Right;   PULMONARY THROMBECTOMY Bilateral 05/12/2023   Procedure: PULMONARY THROMBECTOMY;  Surgeon: Marea Selinda RAMAN, MD;  Location: [facility];  Service: Cardiovascular;  Laterality: Bilateral;   ROBOTIC ADRENALECTOMY Right 12/28/2016   Procedure: ROBOTIC ADRENALECTOMY;  Surgeon: Penne Knee, MD;  Location: [facility];  Service: Urology;  Laterality: Right;   SKIN CANCER EXCISION     THROAT SURGERY  1998   throat cancer    TUBAL LIGATION     VENTRAL HERNIA REPAIR N/A 03/03/2017   Procedure: HERNIA REPAIR VENTRAL ADULT;  Surgeon: Shelva Dunnings, MD;  Location: [facility];  Service: General;  Laterality: N/A;   VIDEO BRONCHOSCOPY WITH ENDOBRONCHIAL ULTRASOUND N/A 01/25/2023   Procedure: VIDEO BRONCHOSCOPY WITH ENDOBRONCHIAL ULTRASOUND;  Surgeon: Parris Manna, MD;  Location:  [facility];  Service: Thoracic;  Laterality: N/A;   VIDEO BRONCHOSCOPY WITH ENDOBRONCHIAL ULTRASOUND N/A 02/17/2023   Procedure: VIDEO BRONCHOSCOPY WITH ENDOBRONCHIAL ULTRASOUND;  Surgeon: Parris Manna, MD;  Location: [facility];  Service: Thoracic;  Laterality: N/A;    Social History   Socioeconomic History   Marital status: Married    Spouse name: Kourtnee, Lahey (Spouse) [phone] (Mobile)   Number of children: 4   Years of education: Not on file   Highest education level: 10th grade  Occupational History   Occupation: retired  Tobacco Use   Smoking status: Former    Current packs/day: 0.00    Average packs/day: 1.5 packs/day for 30.0 years (45.0 ttl pk-yrs)    Types: Cigarettes    Start date: 05/02/1965    Quit date: 05/03/1995    Years since quitting: 28.6    Passive exposure: Past   Smokeless tobacco: Never  Vaping Use   Vaping status: Never Used  Substance and Sexual Activity   Alcohol use: Not Currently    Alcohol/week: 4.0 standard drinks of alcohol    Types: 4  Shots of liquor per week    Comment: none   Drug use: No   Sexual activity: Not Currently    Birth control/protection: Post-menopausal  Other Topics Concern   Not on file  Social History Narrative   Patient from home with husband.   Social Drivers of Corporate investment banker Strain: Low Risk  (12/06/2023)   Overall Financial Resource Strain (CARDIA)    Difficulty of Paying Living Expenses: Not hard at all  Food Insecurity: No Food Insecurity (12/06/2023)   Hunger Vital Sign    Worried About Running Out of Food in the Last Year: Never true    Ran Out of Food in the Last Year: Never true  Transportation Needs: No Transportation Needs (12/06/2023)   PRAPARE - Administrator, Civil Service (Medical): No    Lack of Transportation (Non-Medical): No  Physical Activity: Insufficiently Active (12/06/2023)   Exercise Vital Sign    Days of Exercise per Week: 3 days    Minutes of Exercise per Session: 30 min  Stress: No Stress Concern Present (12/06/2023)   [employer] of Occupational Health - Occupational Stress Questionnaire    Feeling of Stress: Only a little  Social Connections: Moderately Isolated (12/06/2023)   Social Connection and Isolation Panel    Frequency of Communication with Friends and Family: More than three times a week    Frequency of Social Gatherings with Friends and Family: Never    Attends Religious Services: Never    Database administrator or Organizations: No    Attends Banker Meetings: Never    Marital Status: Married  Catering manager Violence: Not At Risk (12/06/2023)   Humiliation, Afraid, Rape, and Kick questionnaire    Fear of Current or Ex-Partner: No    Emotionally Abused: No    Physically Abused: No    Sexually Abused: No    Family History  Problem Relation Age of Onset   Diabetes Mother    Heart attack Mother    Cancer Sister        cancer of eyes, spread to lung, other places. died at 56   Cancer Sister 6       unknown  primary, spread to bone, brain died in 32's   Cancer Paternal Aunt        unk type   Cancer Paternal Grandmother  type unk   Prostate cancer Neg Hx    Kidney cancer Neg Hx    Bladder Cancer Neg Hx    Breast cancer Neg Hx      Current Outpatient Medications:    albuterol  (VENTOLIN  HFA) 108 (90 Base) MCG/ACT inhaler, Inhale 2 puffs into the lungs every 6 (six) hours as needed for wheezing or shortness of breath., Disp: 51 each, Rfl: 2   apixaban  (ELIQUIS ) 5 MG TABS tablet, Take 1 tablet (5 mg total) by mouth 2 (two) times daily., Disp: 60 tablet, Rfl: 3   benzonatate  (TESSALON ) 100 MG capsule, Take 2 capsules (200 mg total) by mouth 3 (three) times daily as needed for cough., Disp: , Rfl:    bisacodyl  (DULCOLAX) 10 MG suppository, Place 1 suppository (10 mg total) rectally daily as needed for moderate constipation or severe constipation., Disp: , Rfl:    buPROPion  (WELLBUTRIN ) 75 MG tablet, Take 75 mg by mouth 2 (two) times daily., Disp: , Rfl:    busPIRone  (BUSPAR ) 5 MG tablet, Take 1 tablet (5 mg total) by mouth 3 (three) times daily as needed., Disp: 270 tablet, Rfl: 2   cyclobenzaprine  (FLEXERIL ) 5 MG tablet, Take 5 mg by mouth 3 (three) times daily as needed for muscle spasms., Disp: , Rfl:    DULoxetine  (CYMBALTA ) 60 MG capsule, Take 1 capsule (60 mg total) by mouth daily., Disp: 90 capsule, Rfl: 1   fentaNYL  (DURAGESIC ) 12 MCG/HR, Place 1 patch onto the skin every 3 (three) days., Disp: 10 patch, Rfl: 0   fluticasone  (FLONASE ) 50 MCG/ACT nasal spray, Place 2 sprays into both nostrils daily., Disp: , Rfl:    Fluticasone -Umeclidin-Vilant (TRELEGY ELLIPTA ) 100-62.5-25 MCG/ACT AEPB, Inhale 1 Act into the lungs daily., Disp: 1 each, Rfl: 5   Fluticasone -Umeclidin-Vilant (TRELEGY ELLIPTA ) 100-62.5-25 MCG/ACT AEPB, Inhale 1 puff into the lungs daily., Disp: , Rfl:    gabapentin  (NEURONTIN ) 300 MG capsule, Take 300 mg by mouth 2 (two) times daily., Disp: , Rfl:    ipratropium-albuterol   (DUONEB) 0.5-2.5 (3) MG/3ML SOLN, Take 3 mLs by nebulization every 4 (four) hours as needed., Disp: , Rfl:    lactulose , encephalopathy, (CHRONULAC ) 10 GM/15ML SOLN, Place 30 mLs (20 g total) into feeding tube 2 (two) times daily., Disp: 946 mL, Rfl: 3   lidocaine  (LIDODERM ) 5 %, Place 1 patch onto the skin daily. Remove & Discard patch within 12 hours or as directed by MD, Disp: 30 patch, Rfl: 2   meloxicam  (MOBIC ) 7.5 MG tablet, Take 1 tablet (7.5 mg total) by mouth daily., Disp: 90 tablet, Rfl: 1   metoprolol  succinate (TOPROL -XL) 25 MG 24 hr tablet, TAKE 1 TABLET BY MOUTH DAILY, Disp: 30 tablet, Rfl: 4   montelukast  (SINGULAIR ) 10 MG tablet, TAKE 1 TABLET BY MOUTH AT BEDTIME, Disp: 90 tablet, Rfl: 0   Nutritional Supplements (NUTREN 1.5) LIQD, Give 1 carton 4 times a day (8am, noon, 4pm and 8pm). Flush with 60ml of water  before and after each feeding.  Give additional 1 1/2 cups of water  a day via tube or drink orally for adequate hydration., Disp: , Rfl:    omeprazole  (KONVOMEP ) 2 mg/mL SUSP oral suspension, Take 20 mLs (40 mg total) by mouth daily., Disp: 240 mL, Rfl: 1   ondansetron  (ZOFRAN ) 4 MG tablet, Take 1 tablet (4 mg total) by mouth every 6 (six) hours as needed for nausea or vomiting., Disp: , Rfl:    oxyCODONE  (ROXICODONE ) 5 MG/5ML solution, Take 5 mLs (5 mg total) by mouth  every 8 (eight) hours as needed for severe pain (pain score 7-10)., Disp: 200 mL, Rfl: 0   rOPINIRole  (REQUIP ) 0.5 MG tablet, Take 1 tablet (0.5 mg total) by mouth 3 (three) times daily., Disp: 90 tablet, Rfl: 1   rosuvastatin  (CRESTOR ) 40 MG tablet, Place 1 tablet (40 mg total) into feeding tube daily., Disp: 90 tablet, Rfl: 0   senna (SENOKOT) 8.6 MG TABS tablet, Place 1 tablet (8.6 mg total) into feeding tube daily., Disp: , Rfl:    sucralfate  (CARAFATE ) 1 GM/10ML suspension, Place 10 mLs (1 g total) into feeding tube 4 (four) times daily., Disp: 1200 mL, Rfl: 2   tolterodine  (DETROL  LA) 4 MG 24 hr capsule, Take 4  mg by mouth daily., Disp: , Rfl:    triamcinolone  cream (KENALOG ) 0.1 %, Apply 1 Application topically 2 (two) times daily., Disp: , Rfl:    Water  For Irrigation, Sterile (FREE WATER ) SOLN, Place 60 mLs into feeding tube 5 (five) times daily., Disp: , Rfl:    cefdinir  (OMNICEF ) 300 MG capsule, Take 1 capsule (300 mg total) by mouth 2 (two) times daily. (Patient not taking: Reported on 01/05/2024), Disp: 20 capsule, Rfl: 0   predniSONE  (DELTASONE ) 20 MG tablet, Take 1 tablet (20 mg total) by mouth daily with breakfast. 7 days (Patient not taking: Reported on 01/05/2024), Disp: 7 tablet, Rfl: 1   simethicone  (MYLICON) 80 MG chewable tablet, Place 1 tablet (80 mg total) into feeding tube 4 (four) times daily. (Patient not taking: Reported on 01/05/2024), Disp: , Rfl:  No current facility-administered medications for this visit.  Facility-Administered Medications Ordered in Other Visits:    cyanocobalamin  ((VITAMIN B-12)) injection 1,000 mcg, 1,000 mcg, Intramuscular, Q30 days, Melanee Annah BROCKS, MD, 1,000 mcg at 08/27/21 1537   cyanocobalamin  (VITAMIN B12) injection 1,000 mcg, 1,000 mcg, Intramuscular, Q30 days, Melanee Annah BROCKS, MD, 1,000 mcg at 03/16/22 1101  Physical exam:  Vitals:   01/05/24 1038  BP: 109/64  Pulse: 76  Resp: 16  Temp: (!) 96.6 F (35.9 C)  TempSrc: Tympanic  SpO2: 100%  Weight: 109 lb 8 oz (49.7 kg)  Height: 5' 1 (1.549 m)   Physical Exam Cardiovascular:     Rate and Rhythm: Normal rate and regular rhythm.     Heart sounds: Normal heart sounds.  Pulmonary:     Effort: Pulmonary effort is normal.     Breath sounds: Normal breath sounds.  Abdominal:     General: Bowel sounds are normal.     Palpations: Abdomen is soft.     Comments: She has a feeding tube in place  Skin:    General: Skin is warm and dry.  Neurological:     Mental Status: She is alert and oriented to person, place, and time.      I have personally reviewed labs listed below:    Latest Ref Rng &  Units 01/05/2024   10:20 AM  CMP  Glucose 70 - 99 mg/dL 74   BUN 8 - 23 mg/dL 25   Creatinine 9.55 - 1.00 mg/dL 9.30   Sodium 864 - 854 mmol/L 137   Potassium 3.5 - 5.1 mmol/L 4.3   Chloride 98 - 111 mmol/L 104   CO2 22 - 32 mmol/L 25   Calcium  8.9 - 10.3 mg/dL 9.0   Total Protein 6.5 - 8.1 g/dL 6.5   Total Bilirubin 0.0 - 1.2 mg/dL 0.5   Alkaline Phos 38 - 126 U/L 72   AST 15 - 41 U/L  48   ALT 0 - 44 U/L 48       Latest Ref Rng & Units 01/05/2024   10:20 AM  CBC  WBC 4.0 - 10.5 K/uL 5.1   Hemoglobin 12.0 - 15.0 g/dL 87.0   Hematocrit 63.9 - 46.0 % 40.1   Platelets 150 - 400 K/uL 175    I have personally reviewed Radiology images listed below: No images are attached to the encounter.  NM PET Image Restag (PS) Skull Base To Thigh Result Date: 01/03/2024 CLINICAL DATA:  Subsequent treatment strategy for non-small cell lung cancer. EXAM: NUCLEAR MEDICINE PET SKULL BASE TO THIGH TECHNIQUE: 5.3 mCi F-18 FDG was injected intravenously. Full-ring PET imaging was performed from the skull base to thigh after the radiotracer. CT data was obtained and used for attenuation correction and anatomic localization. Fasting blood glucose: 108 mg/dl COMPARISON:  CT chest abdomen pelvis 12/13/2023 and PET 12/19/2022. FINDINGS: Mediastinal blood pool activity: SUV max 2.2 Liver activity: SUV max NA NECK: Abnormal hypermetabolism. Incidental CT findings: None. CHEST: Hypermetabolic left supraclavicular mediastinal and left axillary lymph nodes, some of which have resolved in the interval and others of which are new. Index new high prevascular lymph node measures 7 mm (6/36), SUV max 6.5. Previously seen low paratracheal lymph nodes have resolved. Widespread new hypermetabolic hematogenously distributed pulmonary nodules. Incidental CT findings: Right IJ Port-A-Cath terminates in the high right atrium. Atherosclerotic calcification of the aorta, aortic valve and coronary arteries. Heart is enlarged. No pericardial  or pleural effusion. Post radiation scarring in the perihilar left upper lobe. ABDOMEN/PELVIS: No abnormal hypermetabolism. Incidental CT findings: Small low-attenuation lesion off the left kidney. No specific follow-up necessary. Percutaneous gastrostomy. Small hiatal hernia. Atherosclerotic calcification of the aorta. SKELETON: Progressive osseous metastatic lesions with new hypermetabolic metastases in the lateral left ninth rib and left scapula. Previously seen hypermetabolic right ninth posterolateral metastasis. Incidental CT findings: Degenerative changes in the spine. IMPRESSION: 1. Overall disease progression as evidenced by new mediastinal adenopathy, widespread pulmonary nodules and new osseous lesions. 2. Aortic atherosclerosis (ICD10-I70.0). Coronary artery calcification. Electronically Signed   By: Newell Eke M.D.   On: 01/03/2024 13:26   CT CHEST ABDOMEN PELVIS W CONTRAST Result Date: 12/27/2023 EXAM:  CT CHEST ABDOMEN PELVIS WITH IV CONTRAST INDICATION:  Non-small cell lung cancer (NSCLC), recurrence; hx of lung TECHNIQUE: Spiral CT scanning was performed through the chest, abdomen and pelvis after the patient received oral and intravenous contrast. COMPARISON: 08/15/2023 FINDINGS: The cardiac size is within normal limits. There is no thoracic aortic aneurysm. No filling defects are identified in the central pulmonary arteries. The esophagus and thyroid  glands have a normal appearance. There is no mass or adenopathy in the chest. No pleural or pericardial effusion is present. A right-sided chest wall port is present. Innumerable pulmonary nodules are present, representing a marked increase from the previous CT scan. The pulmonary nodules have also increased in size. For example, on image 116/5, there is a posterior right lower lobe subpleural nodule measuring 11 x 7 mm,previously 6 x 2 mm. On image 110/5, the 14 x 11 mm nodule has increased in size from 10 x 4 mm. The 9 x 8 mm nodule on image  103/5 previously measured 5 x 4 mm. Some cavitation is seen in some of the larger nodules. Localized airspace opacity is present in the posterior lingula which is somewhat li linear, consistent with atelectasis. Ited There is no significant abnormality identified in the liver, spleen, pancreas and gallbladder. The gastrostomy  tube remains in satisfactory position. No calculus, obstruction, or soft tissue mass is present involving the kidneys. Left renal cysts are present which do not require follow-up. There are no adrenal masses. The abdominal bowel loops are unremarkable. There is no evidence of ascites or adenopathy. No abdominal aortic aneurysm is present. The bladder, uterus and adnexa have a normal appearance. There is no inguinal hernia. Mild sigmoid diverticulosis is present with no associated inflammation. The appendix has a normal appearance. There is no fracture or bone destruction. IMPRESSION: 1. Increase in size and number of metastatic pulmonary nodules. 2. No evidence of developing metastatic disease in the abdomen and pelvis. Please note that CT scanning at this site utilizes multiple dose reduction techniques, including automatic exposure control, adjustment of the MAA and/or KVP according to the patient's size, and use of iterative reconstruction. Electronically signed by: Eddy Oar MD 12/27/2023 01:37 PM EDT RP Workstation: [id number]     Assessment and plan- Patient is a 68 y.o. female with history of multiple malignancies in the past including head and neck cancer as well as weakly ER positive breast cancer and adrenal carcinoma of unknown primary.  She presented with lung nodules and mediastinal adenopathy a year ago and underwent concurrent chemoradiation.  She is here to discuss CT scan results and further management next  I have reviewed CT chest abdomen and pelvis images as well as PET scan images independently and discussed findings with the patient.  CT scan and PET scan  unfortunately shows multiple areas of metastatic disease especially with multiple lung nodules bony lesions as well as mediastinal adenopathy.  There was also hypermetabolic left supraclavicular adenopathy noted which is not palpable on today's exam.  Previously when patient underwent bronchoscopy in October 2024 we had limited specimen which only showed non-small cell carcinoma but the primary could not be identified.  We treated her with concurrent chemoradiation which she tolerated poorly by then.  Patient is overall a poor bronchoscopy candidate and I will reach out to interventional radiology to see if there are any other safer areas like left supraclavicular adenopathy or axillary adenopathy that can be biopsied for definitive management.  I also discussed with the patient unfortunately at this time we are dealing with stage IV disease and treatment will be palliative not curative.  Patient tolerated weekly CarboTaxol chemotherapy itself poorly last year and I am concerned about her ability to tolerate any further chemotherapy at this time.  Patient lives with her husband who has problems ambulating and also has alcohol issues.  She states that her extended family lives in her backyard and may be able to help her and she also has a granddaughter who can potentially help her.  I will tentatively meet her after biopsy results are back but I did discuss the option of proceeding with best supportive care/hospice given her ongoing frailty.  She will think about it and get back to me at my next visit   Visit Diagnosis 1. Goals of care, counseling/discussion   2. Lung nodules   3. Cancer, metastatic to bone [facility])      Dr. Annah Skene, MD, MPH [facility] [phone] 01/06/2024 2:19 PM

## 2024-01-08 ENCOUNTER — Encounter: Payer: Self-pay | Admitting: *Deleted

## 2024-01-08 DIAGNOSIS — M899 Disorder of bone, unspecified: Secondary | ICD-10-CM

## 2024-01-08 NOTE — Progress Notes (Signed)
 Per Dr. Melanee, pt needs further workup with biopsy. Safest target per Dr. Karalee in IR is the lytic lesion at the inferior tip of the left scapula. Order placed and pt will be notified with appt once scheduled.

## 2024-01-09 NOTE — Progress Notes (Signed)
 Karalee Wilkie POUR, MD sent to [facility] Approved for CT guided bx of lytic lesion at the inferior tip of the LEFT scapula.  HKM

## 2024-01-11 ENCOUNTER — Inpatient Hospital Stay

## 2024-01-11 ENCOUNTER — Encounter: Payer: Self-pay | Admitting: *Deleted

## 2024-01-11 NOTE — Progress Notes (Signed)
 Per Dr. Isaiah, okay to hold eliquis  48 hours prior to biopsy which is scheduled next week.

## 2024-01-12 ENCOUNTER — Telehealth: Payer: Self-pay | Admitting: *Deleted

## 2024-01-12 NOTE — Telephone Encounter (Signed)
 Spoke with patient and reviewed upcoming appts for biopsy and follow up with Dr. Melanee. Informed to hold eliquis  prior to biopsy and instructed to take last dose on 9/13 and hold until 9/17. Pt verbalized understanding. Instructed to call back if has any further questions or needs.

## 2024-01-14 NOTE — H&P (Signed)
 Chief Complaint: Patient was seen in consultation today for lytic lesion at the inferior tip of the left scapula, with consideration for biopsy.  Referring Provider(s): Dr. Annah Skene, MD   Supervising Physician: Philip Cornet  Patient Status: [facility] - Out-pt  Patient is Full Code  History of Present Illness: Denise Watts is a 68 y.o. female  with PMHx notable for HLD, CAD, A-fib and flutter, diastolic dysfunction, dyspnea, breast cancer, adrenal carcinoma, COPD, RLS, and others as delineated below.  Per Dr. Darold progress note on 9/5:  Patient is a 68 y.o. female with history of multiple malignancies in the past including head and neck cancer as well as weakly ER positive breast cancer and adrenal carcinoma of unknown primary.  She presented with lung nodules and mediastinal adenopathy a year ago and underwent concurrent chemoradiation.  She is here to discuss CT scan results and further management next  I have reviewed CT chest abdomen and pelvis images as well as PET scan images independently and discussed findings with the patient.  CT scan and PET scan unfortunately shows multiple areas of metastatic disease especially with multiple lung nodules bony lesions as well as mediastinal adenopathy.  There was also hypermetabolic left supraclavicular adenopathy noted which is not palpable on today's exam.  Previously when patient underwent bronchoscopy in October 2024 we had limited specimen which only showed non-small cell carcinoma but the primary could not be identified.  We treated her with concurrent chemoradiation which she tolerated poorly by then.   Patient is overall a poor bronchoscopy candidate and I will reach out to interventional radiology to see if there are any other safer areas like left supraclavicular adenopathy or axillary adenopathy that can be biopsied for definitive management.  I also discussed with the patient unfortunately at this time we are dealing with stage IV  disease and treatment will be palliative not curative.   Interventional Radiology was requested for biopsy of a lytic lesion at the inferior tip of the left scapula. Request was reviewed and approved by Dr. Karalee. Patient is scheduled for same in IR today.   Patient is alert and laying in bed, calm.  Patient is currently without any significant complaints. She notes dizziness, which she attributes to her medications, a sore throat and chronic cough. She also notes intermittent abdominal pain which she attributes to her G-tube.  Patient denies any fevers, headache, chest pain, SOB, nausea, vomiting or bleeding.     Past Medical History:  Diagnosis Date   Acute pulmonary embolism (HCC) 05/10/2023   Adenomatous colon polyp    Adrenal carcinoma, right (HCC) 12/28/2016   a.) high grade carcinoma with extensive necrosis --> s/p adrenalectomy   Anxiety    Aortic atherosclerosis (HCC)    Arthritis of right knee    Atrial fibrillation and flutter (HCC)    a.) CHA2DS2-VASc = 3 (age, sex, vascular disease history) as of 02/13/2023; b.) cardiac rate/rhythm maintained intrinsically without pharmacological intervention; no chronic OAC (does take low dose ASA)   B12 deficiency    Breast cancer (HCC) 1997   a.) stage IB (cT1a, cN0, cM0, G3, ER+, PR-, HER2-); s/p lumpectomy + XRT   COPD (chronic obstructive pulmonary disease) (HCC)    Coronary artery disease    a.) cCTA 12/09/2021: Ca2+ = 135 (80th %'ile for age/sex/race match contol) --> 25-49% pLAD and RCA distributions   Depression    Diastolic dysfunction    a.) TTE 12/11/2020, no RWMAs, norm RVSF, G1DD; b.)  TTE 12/14/2021: EF 55-60, no RWMAs, norm RVSF, G1DD; c.) TTE 01/27/2023: EF 50-55%, no RWMAs, G1DD, mild RAE, mild MR, mod TR   Dry cough 03/02/2017   Dyspnea    Full dentures    GERD (gastroesophageal reflux disease)    History of hiatal hernia    HLD (hyperlipidemia)    Invasive carcinoma of breast (HCC) 11/15/2017   a.) path (+)  for G3, ER -, PR -, HER2/neu - ; s/p lumpectomy 01/17/2018 + 4 cycles TC systemic chemotherapy + XRT   Long term current use of aspirin     Personal history of chemotherapy    Personal history of radiation therapy    Pneumothorax of left lung after biopsy 01/25/2023   a.) hypoxic in PACU requiring re-intubation --> transferred to ICU --> chest tube placed by PCCM   RLS (restless legs syndrome)    a.) on ropinirole    Skin cancer    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   a.) Stage IB (cT1a, cN0, cM0, G3, ER+, PR-, HER2-)    Past Surgical History:  Procedure Laterality Date   BIOPSY  01/05/2023   Procedure: BIOPSY;  Surgeon: Unk Corinn Skiff, MD;  Location: [facility];  Service: Gastroenterology;;   BREAST BIOPSY Right 1997   positive   BREAST BIOPSY Right 11/15/2017   INVASIVE MAMMARY CARCINOMA triple negative   BREAST BIOPSY Right 07/20/2020   us  bx, Q marker, neg   BREAST LUMPECTOMY Right 1997   2019 also   BREAST LUMPECTOMY WITH SENTINEL LYMPH NODE BIOPSY Right 12/08/2017   Procedure: BREAST LUMPECTOMY WITH SENTINEL LYMPH NODE BX;  Surgeon: Dessa Reyes ORN, MD;  Location: [facility] ORS;  Service: General;  Laterality: Right;   BREAST REDUCTION WITH MASTOPEXY Left 06/10/2019   Procedure: Left breast mastopexy reduction for symmetry;  Surgeon: Lowery Estefana RAMAN, DO;  Location: [facility];  Service: Plastics;  Laterality: Left;  total 2.5 hours   CATARACT EXTRACTION W/PHACO Right 02/07/2022   Procedure: CATARACT EXTRACTION PHACO AND INTRAOCULAR LENS PLACEMENT (IOC) RIGHT DIABETIC;  Surgeon: Myrna Adine Anes, MD;  Location: [facility];  Service: Ophthalmology;  Laterality: Right;  3.60 00:34.7   CATARACT EXTRACTION W/PHACO Left 02/28/2022   Procedure: CATARACT EXTRACTION PHACO AND INTRAOCULAR LENS PLACEMENT (IOC) LEFT DIABETIC 4.11 00:26.1;  Surgeon: Myrna Adine Anes, MD;  Location: [facility];  Service: Ophthalmology;  Laterality:  Left;   COLONOSCOPY  2015   COLONOSCOPY N/A 01/05/2023   Procedure: COLONOSCOPY;  Surgeon: Unk Corinn Skiff, MD;  Location: [facility];  Service: Gastroenterology;  Laterality: N/A;   COLONOSCOPY WITH PROPOFOL  N/A 10/29/2019   Procedure: COLONOSCOPY WITH PROPOFOL ;  Surgeon: Jinny Carmine, MD;  Location: [facility];  Service: Endoscopy;  Laterality: N/A;   ESOPHAGOGASTRODUODENOSCOPY (EGD) WITH PROPOFOL  N/A 05/24/2019   Procedure: ESOPHAGOGASTRODUODENOSCOPY (EGD) WITH PROPOFOL ;  Surgeon: Jinny Carmine, MD;  Location: [facility];  Service: Endoscopy;  Laterality: N/A;   ESOPHAGOGASTRODUODENOSCOPY (EGD) WITH PROPOFOL  N/A 09/06/2022   Procedure: ESOPHAGOGASTRODUODENOSCOPY (EGD) WITH PROPOFOL ;  Surgeon: Jinny Carmine, MD;  Location: [facility];  Service: Endoscopy;  Laterality: N/A;   ESOPHAGOGASTRODUODENOSCOPY (EGD) WITH PROPOFOL   01/05/2023   Procedure: ESOPHAGOGASTRODUODENOSCOPY (EGD) WITH PROPOFOL ;  Surgeon: Unk Corinn Skiff, MD;  Location: [facility];  Service: Gastroenterology;;   ESOPHAGUS SURGERY     GASTROSTOMY N/A 05/25/2023   Procedure: OPEN INSERTION OF GASTROSTOMY TUBE;  Surgeon: Jordis Laneta FALCON, MD;  Location: [facility];  Service: General;  Laterality: N/A;   IR IMAGING GUIDED PORT  INSERTION  02/07/2023   IR REPLC GASTRO/COLONIC TUBE PERCUT W/FLUORO  09/12/2023   KNEE SURGERY Right    LIPOSUCTION WITH LIPOFILLING Right 06/10/2019   Procedure: right breast scar and capsule release with fat grafting;  Surgeon: Lowery Estefana RAMAN, DO;  Location: [facility];  Service: Plastics;  Laterality: Right;   POLYPECTOMY  01/05/2023   Procedure: POLYPECTOMY;  Surgeon: Unk Corinn Skiff, MD;  Location: [facility] ENDOSCOPY;  Service: Gastroenterology;;   [facility] REMOVAL     PORTACATH PLACEMENT Right 01/17/2018   Procedure: INSERTION PORT-A-CATH- RIGHT;  Surgeon: Dessa Reyes ORN, MD;  Location: [facility];  Service: General;  Laterality: Right;   PULMONARY THROMBECTOMY  Bilateral 05/12/2023   Procedure: PULMONARY THROMBECTOMY;  Surgeon: Marea Selinda RAMAN, MD;  Location: [facility];  Service: Cardiovascular;  Laterality: Bilateral;   ROBOTIC ADRENALECTOMY Right 12/28/2016   Procedure: ROBOTIC ADRENALECTOMY;  Surgeon: Penne Knee, MD;  Location: [facility];  Service: Urology;  Laterality: Right;   SKIN CANCER EXCISION     THROAT SURGERY  1998   throat cancer    TUBAL LIGATION     VENTRAL HERNIA REPAIR N/A 03/03/2017   Procedure: HERNIA REPAIR VENTRAL ADULT;  Surgeon: Shelva Dunnings, MD;  Location: [facility];  Service: General;  Laterality: N/A;   VIDEO BRONCHOSCOPY WITH ENDOBRONCHIAL ULTRASOUND N/A 01/25/2023   Procedure: VIDEO BRONCHOSCOPY WITH ENDOBRONCHIAL ULTRASOUND;  Surgeon: Parris Manna, MD;  Location: [facility];  Service: Thoracic;  Laterality: N/A;   VIDEO BRONCHOSCOPY WITH ENDOBRONCHIAL ULTRASOUND N/A 02/17/2023   Procedure: VIDEO BRONCHOSCOPY WITH ENDOBRONCHIAL ULTRASOUND;  Surgeon: Parris Manna, MD;  Location: [facility];  Service: Thoracic;  Laterality: N/A;    Allergies: Sulfa  antibiotics  Medications: Prior to Admission medications   Medication Sig Start Date End Date Taking? Authorizing Provider  albuterol  (VENTOLIN  HFA) 108 (90 Base) MCG/ACT inhaler Inhale 2 puffs into the lungs every 6 (six) hours as needed for wheezing or shortness of breath. 07/21/23   Pardue, Lauraine SAILOR, DO  apixaban  (ELIQUIS ) 5 MG TABS tablet Take 1 tablet (5 mg total) by mouth 2 (two) times daily. 08/30/23   Kasa, Kurian, MD  benzonatate  (TESSALON ) 100 MG capsule Take 2 capsules (200 mg total) by mouth 3 (three) times daily as needed for cough. 06/05/23   Laurence Locus, DO  bisacodyl  (DULCOLAX) 10 MG suppository Place 1 suppository (10 mg total) rectally daily as needed for moderate constipation or severe constipation. 06/05/23   Laurence Locus, DO  buPROPion  (WELLBUTRIN ) 75 MG tablet Take 75 mg by mouth 2 (two) times daily.    [provider]  busPIRone  (BUSPAR ) 5  MG tablet Take 1 tablet (5 mg total) by mouth 3 (three) times daily as needed. 11/27/23   [name], DO  cefdinir  (OMNICEF ) 300 MG capsule Take 1 capsule (300 mg total) by mouth 2 (two) times daily. Patient not taking: Reported on 01/05/2024 07/04/23   [name], DO  cyclobenzaprine  (FLEXERIL ) 5 MG tablet Take 5 mg by mouth 3 (three) times daily as needed for muscle spasms.    [provider]  DULoxetine  (CYMBALTA ) 60 MG capsule Take 1 capsule (60 mg total) by mouth daily. 09/05/23   [name], DO  fentaNYL  (DURAGESIC ) 12 MCG/HR Place 1 patch onto the skin every 3 (three) days. 11/10/23   Dasie Tinnie MATSU, NP  fluticasone  (FLONASE ) 50 MCG/ACT nasal spray Place 2 sprays into both nostrils daily.    [provider]  Fluticasone -Umeclidin-Vilant (TRELEGY ELLIPTA ) 100-62.5-25 MCG/ACT AEPB  Inhale 1 Act into the lungs daily. 08/30/23   Kasa, Kurian, MD  Fluticasone -Umeclidin-Vilant (TRELEGY ELLIPTA ) 100-62.5-25 MCG/ACT AEPB Inhale 1 puff into the lungs daily. 12/22/23   Kasa, Kurian, MD  gabapentin  (NEURONTIN ) 300 MG capsule Take 300 mg by mouth 2 (two) times daily.    [provider]  ipratropium-albuterol  (DUONEB) 0.5-2.5 (3) MG/3ML SOLN Take 3 mLs by nebulization every 4 (four) hours as needed.    [provider]  lactulose , encephalopathy, (CHRONULAC ) 10 GM/15ML SOLN Place 30 mLs (20 g total) into feeding tube 2 (two) times daily. 11/27/23   [name], DO  lidocaine  (LIDODERM ) 5 % Place 1 patch onto the skin daily. Remove & Discard patch within 12 hours or as directed by MD 08/25/23   Melanee Annah BROCKS, MD  meloxicam  (MOBIC ) 7.5 MG tablet Take 1 tablet (7.5 mg total) by mouth daily. 11/27/23   [name], DO  metoprolol  succinate (TOPROL -XL) 25 MG 24 hr tablet TAKE 1 TABLET BY MOUTH DAILY 01/04/24   Pardue, Lauraine SAILOR, DO  montelukast  (SINGULAIR ) 10 MG tablet TAKE 1 TABLET BY MOUTH AT BEDTIME 09/29/23   Pardue, Lauraine SAILOR, DO  Nutritional Supplements (NUTREN 1.5)  LIQD Give 1 carton 4 times a day (8am, noon, 4pm and 8pm). Flush with 60ml of water  before and after each feeding.  Give additional 1 1/2 cups of water  a day via tube or drink orally for adequate hydration. 06/28/23   Melanee Annah BROCKS, MD  omeprazole  (KONVOMEP ) 2 mg/mL SUSP oral suspension Take 20 mLs (40 mg total) by mouth daily. 04/12/23   Borders, Fonda SAUNDERS, NP  ondansetron  (ZOFRAN ) 4 MG tablet Take 1 tablet (4 mg total) by mouth every 6 (six) hours as needed for nausea or vomiting. 06/05/23 06/04/24  Laurence Locus, DO  oxyCODONE  (ROXICODONE ) 5 MG/5ML solution Take 5 mLs (5 mg total) by mouth every 8 (eight) hours as needed for severe pain (pain score 7-10). 12/26/23   Melanee Annah BROCKS, MD  predniSONE  (DELTASONE ) 20 MG tablet Take 1 tablet (20 mg total) by mouth daily with breakfast. 7 days Patient not taking: Reported on 01/05/2024 08/30/23   Kasa, Kurian, MD  rOPINIRole  (REQUIP ) 0.5 MG tablet Take 1 tablet (0.5 mg total) by mouth 3 (three) times daily. 11/27/23   [name], DO  rosuvastatin  (CRESTOR ) 40 MG tablet Place 1 tablet (40 mg total) into feeding tube daily. 11/15/23   [name], DO  senna (SENOKOT) 8.6 MG TABS tablet Place 1 tablet (8.6 mg total) into feeding tube daily. 06/06/23   Laurence Locus, DO  simethicone  (MYLICON) 80 MG chewable tablet Place 1 tablet (80 mg total) into feeding tube 4 (four) times daily. Patient not taking: Reported on 01/05/2024 06/05/23   Laurence Locus, DO  sucralfate  (CARAFATE ) 1 GM/10ML suspension Place 10 mLs (1 g total) into feeding tube 4 (four) times daily. 10/12/23   [name], DO  tolterodine  (DETROL  LA) 4 MG 24 hr capsule Take 4 mg by mouth daily. 08/24/23   [provider]  triamcinolone  cream (KENALOG ) 0.1 % Apply 1 Application topically 2 (two) times daily. 04/24/23   [provider]  Water  For Irrigation, Sterile (FREE WATER ) SOLN Place 60 mLs into feeding tube 5 (five) times daily. 06/05/23   Laurence Locus, DO     Family History  Problem Relation  Age of Onset   Diabetes Mother    Heart attack Mother    Cancer Sister  cancer of eyes, spread to lung, other places. died at 7   Cancer Sister 56       unknown primary, spread to bone, brain died in 8's   Cancer Paternal Aunt        unk type   Cancer Paternal Grandmother        type unk   Prostate cancer Neg Hx    Kidney cancer Neg Hx    Bladder Cancer Neg Hx    Breast cancer Neg Hx     Social History   Socioeconomic History   Marital status: Married    Spouse name: Herbst,Joseph (Spouse) [phone] (Mobile)   Number of children: 4   Years of education: Not on file   Highest education level: 10th grade  Occupational History   Occupation: retired  Tobacco Use   Smoking status: Former    Current packs/day: 0.00    Average packs/day: 1.5 packs/day for 30.0 years (45.0 ttl pk-yrs)    Types: Cigarettes    Start date: 05/02/1965    Quit date: 05/03/1995    Years since quitting: 28.7    Passive exposure: Past   Smokeless tobacco: Never  Vaping Use   Vaping status: Never Used  Substance and Sexual Activity   Alcohol use: Not Currently    Alcohol/week: 4.0 standard drinks of alcohol    Types: 4 Shots of liquor per week    Comment: none   Drug use: No   Sexual activity: Not Currently    Birth control/protection: Post-menopausal  Other Topics Concern   Not on file  Social History Narrative   Patient from home with husband.   Social Drivers of Corporate investment banker Strain: Low Risk  (12/06/2023)   Overall Financial Resource Strain (CARDIA)    Difficulty of Paying Living Expenses: Not hard at all  Food Insecurity: No Food Insecurity (12/06/2023)   Hunger Vital Sign    Worried About Running Out of Food in the Last Year: Never true    Ran Out of Food in the Last Year: Never true  Transportation Needs: No Transportation Needs (12/06/2023)   PRAPARE - Administrator, Civil Service (Medical): No    Lack of Transportation (Non-Medical): No  Physical  Activity: Insufficiently Active (12/06/2023)   Exercise Vital Sign    Days of Exercise per Week: 3 days    Minutes of Exercise per Session: 30 min  Stress: No Stress Concern Present (12/06/2023)   [employer] of Occupational Health - Occupational Stress Questionnaire    Feeling of Stress: Only a little  Social Connections: Moderately Isolated (12/06/2023)   Social Connection and Isolation Panel    Frequency of Communication with Friends and Family: More than three times a week    Frequency of Social Gatherings with Friends and Family: Never    Attends Religious Services: Never    Database administrator or Organizations: No    Attends Engineer, structural: Never    Marital Status: Married     Review of Systems: A 12 point ROS discussed and pertinent positives are indicated in the HPI above.  All other systems are negative.  Vital Signs: There were no vitals taken for this visit.  Advance Care Plan: The advanced care place/surrogate decision maker was discussed at the time of visit and the patient did not wish to discuss or was not able to name a surrogate decision maker or provide an advance care plan.  Physical Exam Constitutional:  General: She is not in acute distress.    Appearance: Normal appearance.  HENT:     Mouth/Throat:     Mouth: Mucous membranes are dry.  Cardiovascular:     Rate and Rhythm: Normal rate and regular rhythm.     Pulses: Normal pulses.     Heart sounds: Normal heart sounds.  Pulmonary:     Effort: Pulmonary effort is normal.     Breath sounds: Normal breath sounds.  Abdominal:     General: Abdomen is flat.     Comments: Gastrostomy tube in place in the LLQ  Musculoskeletal:        General: Normal range of motion.     Cervical back: Normal range of motion.     Comments: No localized tenderness at left scapula. Patient did experience tenderness to palpation at left flank, below the level of the scapula.  Skin:    General: Skin is  warm and dry.  Neurological:     Mental Status: She is alert and oriented to person, place, and time.  Psychiatric:        Mood and Affect: Mood normal.        Behavior: Behavior normal.        Thought Content: Thought content normal.        Judgment: Judgment normal.     Imaging: NM PET Image Restag (PS) Skull Base To Thigh Result Date: 01/03/2024 CLINICAL DATA:  Subsequent treatment strategy for non-small cell lung cancer. EXAM: NUCLEAR MEDICINE PET SKULL BASE TO THIGH TECHNIQUE: 5.3 mCi F-18 FDG was injected intravenously. Full-ring PET imaging was performed from the skull base to thigh after the radiotracer. CT data was obtained and used for attenuation correction and anatomic localization. Fasting blood glucose: 108 mg/dl COMPARISON:  CT chest abdomen pelvis 12/13/2023 and PET 12/19/2022. FINDINGS: Mediastinal blood pool activity: SUV max 2.2 Liver activity: SUV max NA NECK: Abnormal hypermetabolism. Incidental CT findings: None. CHEST: Hypermetabolic left supraclavicular mediastinal and left axillary lymph nodes, some of which have resolved in the interval and others of which are new. Index new high prevascular lymph node measures 7 mm (6/36), SUV max 6.5. Previously seen low paratracheal lymph nodes have resolved. Widespread new hypermetabolic hematogenously distributed pulmonary nodules. Incidental CT findings: Right IJ Port-A-Cath terminates in the high right atrium. Atherosclerotic calcification of the aorta, aortic valve and coronary arteries. Heart is enlarged. No pericardial or pleural effusion. Post radiation scarring in the perihilar left upper lobe. ABDOMEN/PELVIS: No abnormal hypermetabolism. Incidental CT findings: Small low-attenuation lesion off the left kidney. No specific follow-up necessary. Percutaneous gastrostomy. Small hiatal hernia. Atherosclerotic calcification of the aorta. SKELETON: Progressive osseous metastatic lesions with new hypermetabolic metastases in the lateral  left ninth rib and left scapula. Previously seen hypermetabolic right ninth posterolateral metastasis. Incidental CT findings: Degenerative changes in the spine. IMPRESSION: 1. Overall disease progression as evidenced by new mediastinal adenopathy, widespread pulmonary nodules and new osseous lesions. 2. Aortic atherosclerosis (ICD10-I70.0). Coronary artery calcification. Electronically Signed   By: Newell Eke M.D.   On: 01/03/2024 13:26    Labs:  CBC: Recent Labs    06/28/23 1254 09/04/23 1318 09/12/23 1100 01/05/24 1020  WBC 6.5 6.6 6.8 5.1  HGB 12.9 11.7* 12.9 12.9  HCT 40.1 36.3 39.7 40.1  PLT 208 184 188 175    COAGS: Recent Labs    01/23/23 1317 05/10/23 2038 05/10/23 2252 05/11/23 1903 05/12/23 0148 05/12/23 0838 05/24/23 1903 09/12/23 1100  INR 1.0 1.9*  --   --   --   --  1.1 1.1  APTT 26 >200*   < > 115* 115* 108* 32  --    < > = values in this interval not displayed.    BMP: Recent Labs    06/05/23 0730 06/28/23 1254 09/04/23 1318 01/05/24 1020  NA 141 136 134* 137  K 4.7 4.9 4.5 4.3  CL 103 101 101 104  CO2 30 26 25 25   GLUCOSE 99 96 211* 74  BUN 13 24* 25* 25*  CALCIUM  8.5* 9.0 8.8* 9.0  CREATININE 0.47 0.80 0.60 0.69  GFRNONAA >60 >60 >60 >60    LIVER FUNCTION TESTS: Recent Labs    06/05/23 0730 06/28/23 1254 09/04/23 1318 01/05/24 1020  BILITOT 0.5 1.0 0.4 0.5  AST 25 21 71* 48*  ALT 13 11 87* 48*  ALKPHOS 49 76 79 72  PROT 5.2* 6.8 6.3* 6.5  ALBUMIN  2.6* 3.7 3.6 3.6    TUMOR MARKERS: No results for input(s): AFPTM, CEA, CA199, CHROMGRNA in the last 8760 hours.  Assessment and Plan: Per Dr. Darold progress note on 9/5:  Patient is a 68 y.o. female with history of multiple malignancies in the past including head and neck cancer as well as weakly ER positive breast cancer and adrenal carcinoma of unknown primary.  [...]   Patient is overall a poor bronchoscopy candidate and I will reach out to interventional radiology  to see if there are any other safer areas like left supraclavicular adenopathy or axillary adenopathy that can be biopsied for definitive management.  I also discussed with the patient unfortunately at this time we are dealing with stage IV disease and treatment will be palliative not curative.  Patient presents for scheduled bone biopsy in IR today.  Patient has been NPO since midnight.  All labs and medications are within acceptable parameters.  No pertinent allergies.   Risks and benefits of bone biopsy was discussed with the patient and/or patient's family including, but not limited to bleeding, infection, damage to adjacent structures or low yield requiring additional tests.  All of the questions were answered and there is agreement to proceed.  Consent signed and in chart.    Thank you for allowing our service to participate in KYANNAH CLIMER 's care.  Electronically Signed: Carlin DELENA Griffon, PA-C   01/14/2024, 5:52 PM      I spent a total of 30 Minutes in face to face in clinical consultation, greater than 50% of which was counseling/coordinating care for lytic lesion at the inferior tip of the left scapula, with consideration for biopsy.

## 2024-01-15 NOTE — Progress Notes (Signed)
 Patient for CT guided biopsy of lytic lesion at the inferior tip of the LT scapula on Tues 01/16/24, I called and spoke with the patient on the phone and gave pre-procedure instructions. Pt was made aware to be here at 10a, last dose of Eliquis  was early Sun 01/14/24, NPO after MN prior to procedure as well as driver post procedure/recovery/discharge. Pt stated understanding.  Called   01/15/24

## 2024-01-16 ENCOUNTER — Other Ambulatory Visit: Payer: Self-pay

## 2024-01-16 ENCOUNTER — Ambulatory Visit
Admission: RE | Admit: 2024-01-16 | Discharge: 2024-01-16 | Disposition: A | Discharge status: Home / Self Care | Source: Ambulatory Visit | Attending: Oncology | Admitting: Oncology

## 2024-01-16 VITALS — BP 101/55 | HR 61 | Temp 97.7°F | Resp 11 | Ht 61.0 in | Wt 108.0 lb

## 2024-01-16 DIAGNOSIS — I2510 Atherosclerotic heart disease of native coronary artery without angina pectoris: Secondary | ICD-10-CM | POA: Diagnosis not present

## 2024-01-16 DIAGNOSIS — Z801 Family history of malignant neoplasm of trachea, bronchus and lung: Secondary | ICD-10-CM | POA: Diagnosis not present

## 2024-01-16 DIAGNOSIS — Z8521 Personal history of malignant neoplasm of larynx: Secondary | ICD-10-CM | POA: Diagnosis not present

## 2024-01-16 DIAGNOSIS — Z85858 Personal history of malignant neoplasm of other endocrine glands: Secondary | ICD-10-CM | POA: Insufficient documentation

## 2024-01-16 DIAGNOSIS — Z923 Personal history of irradiation: Secondary | ICD-10-CM | POA: Diagnosis not present

## 2024-01-16 DIAGNOSIS — Z853 Personal history of malignant neoplasm of breast: Secondary | ICD-10-CM | POA: Insufficient documentation

## 2024-01-16 DIAGNOSIS — C3490 Malignant neoplasm of unspecified part of unspecified bronchus or lung: Secondary | ICD-10-CM | POA: Diagnosis not present

## 2024-01-16 DIAGNOSIS — E785 Hyperlipidemia, unspecified: Secondary | ICD-10-CM | POA: Insufficient documentation

## 2024-01-16 DIAGNOSIS — R1310 Dysphagia, unspecified: Secondary | ICD-10-CM | POA: Insufficient documentation

## 2024-01-16 DIAGNOSIS — Z8589 Personal history of malignant neoplasm of other organs and systems: Secondary | ICD-10-CM | POA: Insufficient documentation

## 2024-01-16 DIAGNOSIS — R590 Localized enlarged lymph nodes: Secondary | ICD-10-CM | POA: Diagnosis not present

## 2024-01-16 DIAGNOSIS — J449 Chronic obstructive pulmonary disease, unspecified: Secondary | ICD-10-CM | POA: Insufficient documentation

## 2024-01-16 DIAGNOSIS — M899 Disorder of bone, unspecified: Secondary | ICD-10-CM | POA: Diagnosis not present

## 2024-01-16 DIAGNOSIS — G2581 Restless legs syndrome: Secondary | ICD-10-CM | POA: Insufficient documentation

## 2024-01-16 DIAGNOSIS — Z01818 Encounter for other preprocedural examination: Secondary | ICD-10-CM | POA: Diagnosis not present

## 2024-01-16 DIAGNOSIS — R053 Chronic cough: Secondary | ICD-10-CM | POA: Insufficient documentation

## 2024-01-16 DIAGNOSIS — C7951 Secondary malignant neoplasm of bone: Secondary | ICD-10-CM | POA: Insufficient documentation

## 2024-01-16 DIAGNOSIS — R918 Other nonspecific abnormal finding of lung field: Secondary | ICD-10-CM | POA: Diagnosis not present

## 2024-01-16 DIAGNOSIS — Z9221 Personal history of antineoplastic chemotherapy: Secondary | ICD-10-CM | POA: Insufficient documentation

## 2024-01-16 DIAGNOSIS — I4891 Unspecified atrial fibrillation: Secondary | ICD-10-CM | POA: Diagnosis not present

## 2024-01-16 LAB — CBC
HCT: 41.4 % (ref 36.0–46.0)
Hemoglobin: 13.2 g/dL (ref 12.0–15.0)
MCH: 32.2 pg (ref 26.0–34.0)
MCHC: 31.9 g/dL (ref 30.0–36.0)
MCV: 101 fL — ABNORMAL HIGH (ref 80.0–100.0)
Platelets: 182 K/uL (ref 150–400)
RBC: 4.1 MIL/uL (ref 3.87–5.11)
RDW: 12.6 % (ref 11.5–15.5)
WBC: 5.8 K/uL (ref 4.0–10.5)
nRBC: 0 % (ref 0.0–0.2)

## 2024-01-16 LAB — PROTIME-INR
INR: 1.1 (ref 0.8–1.2)
Prothrombin Time: 14.7 s (ref 11.4–15.2)

## 2024-01-16 MED ORDER — FENTANYL CITRATE (PF) 100 MCG/2ML IJ SOLN
INTRAMUSCULAR | Status: AC | PRN
  Administered 2024-01-16: 50 ug via INTRAVENOUS
  Administered 2024-01-16: 25 ug via INTRAVENOUS

## 2024-01-16 MED ORDER — SODIUM CHLORIDE 0.9 % IV SOLN
INTRAVENOUS | Status: DC
Start: 2024-01-16 — End: 2024-01-17

## 2024-01-16 MED ORDER — MIDAZOLAM HCL 2 MG/2ML IJ SOLN
INTRAMUSCULAR | Status: AC
  Filled 2024-01-16: qty 2

## 2024-01-16 MED ORDER — FENTANYL CITRATE (PF) 100 MCG/2ML IJ SOLN
INTRAMUSCULAR | Status: AC
  Filled 2024-01-16: qty 2

## 2024-01-16 MED ORDER — MIDAZOLAM HCL 5 MG/5ML IJ SOLN
INTRAMUSCULAR | Status: AC | PRN
  Administered 2024-01-16: 1 mg via INTRAVENOUS
  Administered 2024-01-16: .5 mg via INTRAVENOUS

## 2024-01-16 NOTE — Procedures (Signed)
 Interventional Radiology Procedure:   Indications: History of breast and vocal cord cancer.  Bone lesions and pulmonary nodules.  Procedure: CT guided core biopsy of lytic left scapula lesion  Findings: Small lytic lesion involving left scapula.  Two soft tissue core biopsies obtained from the lytic lesion.   Complications: None     EBL: Minimal  Plan: Discharge to home in 1 hour.  Denise Watts R. Philip, MD  Pager: [phone]

## 2024-01-16 NOTE — Progress Notes (Signed)
 Patient clinically stable post Ct Lytic lesion biopsy scapula left per Dr Philip, tolerated well. Vitals stable pre and post procedure. Received Versed  1.5 mg along with Fentanyl   75 mcg IV for procedure. Report  given to Joni Boyd RN post procedure/specials/13.

## 2024-01-18 ENCOUNTER — Encounter: Payer: Self-pay | Admitting: *Deleted

## 2024-01-18 LAB — SURGICAL PATHOLOGY

## 2024-01-18 NOTE — Progress Notes (Signed)
 Order for [employer], xR, and PDL1 ordered on recent biopsy sample [phone].

## 2024-01-19 ENCOUNTER — Other Ambulatory Visit: Payer: Self-pay | Admitting: Oncology

## 2024-01-19 ENCOUNTER — Other Ambulatory Visit: Payer: Self-pay | Admitting: Family Medicine

## 2024-01-19 MED ORDER — OXYCODONE HCL 5 MG/5ML PO SOLN: 5.0000 mg | Freq: Three times a day (TID) | ORAL | 0 refills | Status: DC | PRN

## 2024-01-22 ENCOUNTER — Inpatient Hospital Stay (HOSPITAL_BASED_OUTPATIENT_CLINIC_OR_DEPARTMENT_OTHER): Admitting: Oncology

## 2024-01-22 ENCOUNTER — Encounter: Payer: Self-pay | Admitting: Oncology

## 2024-01-22 VITALS — BP 110/40 | HR 65 | Temp 96.6°F | Resp 20 | Ht 61.0 in | Wt 108.1 lb

## 2024-01-22 DIAGNOSIS — G893 Neoplasm related pain (acute) (chronic): Secondary | ICD-10-CM

## 2024-01-22 DIAGNOSIS — Z8521 Personal history of malignant neoplasm of larynx: Secondary | ICD-10-CM | POA: Diagnosis not present

## 2024-01-22 DIAGNOSIS — Z853 Personal history of malignant neoplasm of breast: Secondary | ICD-10-CM

## 2024-01-22 DIAGNOSIS — C779 Secondary and unspecified malignant neoplasm of lymph node, unspecified: Secondary | ICD-10-CM

## 2024-01-22 DIAGNOSIS — Z85819 Personal history of malignant neoplasm of unspecified site of lip, oral cavity, and pharynx: Secondary | ICD-10-CM | POA: Diagnosis not present

## 2024-01-22 DIAGNOSIS — E538 Deficiency of other specified B group vitamins: Secondary | ICD-10-CM | POA: Diagnosis not present

## 2024-01-22 DIAGNOSIS — C7951 Secondary malignant neoplasm of bone: Secondary | ICD-10-CM | POA: Diagnosis not present

## 2024-01-22 DIAGNOSIS — C3491 Malignant neoplasm of unspecified part of right bronchus or lung: Secondary | ICD-10-CM | POA: Diagnosis not present

## 2024-01-22 DIAGNOSIS — Z7189 Other specified counseling: Secondary | ICD-10-CM

## 2024-01-22 DIAGNOSIS — R918 Other nonspecific abnormal finding of lung field: Secondary | ICD-10-CM | POA: Diagnosis not present

## 2024-01-22 DIAGNOSIS — Z87891 Personal history of nicotine dependence: Secondary | ICD-10-CM

## 2024-01-22 DIAGNOSIS — R590 Localized enlarged lymph nodes: Secondary | ICD-10-CM | POA: Diagnosis not present

## 2024-01-22 NOTE — Telephone Encounter (Signed)
 Requested Prescriptions  Pending Prescriptions Disp Refills   montelukast  (SINGULAIR ) 10 MG tablet [Pharmacy Med Name: MONTELUKAST  SODIUM 10 MG TAB] 90 tablet 0    Sig: TAKE 1 TABLET BY MOUTH AT BEDTIME     Pulmonology:  Leukotriene Inhibitors Passed - 01/22/2024  8:12 AM      Passed - Valid encounter within last 12 months    Recent Outpatient Visits           6 months ago Chronic cough   [facility], Lauraine SAILOR, DO   6 months ago Mucopurulent chronic bronchitis [facility])   [facility], Lauraine SAILOR, DO   7 months ago Protein-calorie malnutrition, moderate (HCC)   [facility] [place], [place], MD

## 2024-01-22 NOTE — Progress Notes (Signed)
 Patient states she's tired but has no new or acute concerns at this time.

## 2024-01-22 NOTE — Progress Notes (Signed)
 Hematology/Oncology Consult note [facility]  [phone] [phone]  Patient Care Team: [name], DO as PCP - General (Family Medicine) Darliss Rogue, MD as PCP - Cardiology (Cardiology) Melanee Annah BROCKS, MD as Consulting Physician (Oncology) Jordis Laneta FALCON, MD as Consulting Physician (General Surgery) Dasher, Alm LABOR, MD (Dermatology) Scheeler, Donnice PARAS, PA-C as Physician Assistant (Plastic Surgery) Dorothyann Drivers, MD as Attending Physician (Emergency Medicine) Ferrel, Lionel Sotero RIGGERS (Neurology) French Mering, MD as Referring Physician (Internal Medicine) Shellia Oh, MD as Consulting Physician (Pulmonary Disease) Pa, [facility] (Optometry) Verdene Gills, RN as Oncology Nurse Navigator Lenn Aran, MD as Consulting Physician (Radiation Oncology) Moises Reusing, RN as [facility], [place], LCSW as [facility], [place], [place]   Name of the patient: Denise Watts  [id number]  July 13, 1955   Date of visit: 01/22/24  Diagnosis- metastatic non small cell lung cancer  Chief complaint/ Reason for visit-discuss biopsy results and further management  Heme/Onc history:  Oncology History Overview Note  1. Carcinoma of breast, T1, N0, M0 tumor diagnosis.  In January of 1997 2. Carcinoma of the vocal cord T1, N0, M0 tumor.  Status postradiation therapy 3. Abnormal CT scan of the chest (December, 2015) 4. Repeat CT scan of chest shows stable nodule (July, 2016) 5. Neutropenia with normal hemoglobin and normal platelet count (July, 2016)   Cancer of vocal cord [facility]) (Resolved)  11/10/2014 Initial Diagnosis   Cancer of vocal cord   Malignant neoplasm of lower-outer quadrant of right breast of female, estrogen receptor negative (HCC)  11/24/2017 Initial Diagnosis   Malignant neoplasm of lower-outer quadrant of right breast of female, estrogen receptor negative (HCC)    01/12/2018 Cancer Staging   Staging form: Breast, AJCC 8th Edition - Clinical stage from 01/12/2018: Stage IB (cT1a, cN0, cM0, G3, ER+, PR-, HER2-) - Signed by Melanee Annah BROCKS, MD on 01/12/2018   01/30/2018 - 04/26/2018 Chemotherapy   The patient had dexamethasone  (DECADRON ) 4 MG tablet, 8 mg, Oral, 2 times daily, 1 of 1 cycle, Start date: 01/12/2018, End date: 08/03/2018 palonosetron  (ALOXI ) injection 0.25 mg, 0.25 mg, Intravenous,  Once, 4 of 4 cycles Administration: 0.25 mg (01/30/2018), 0.25 mg (02/27/2018), 0.25 mg (03/28/2018), 0.25 mg (04/26/2018) pegfilgrastim  (NEULASTA  ONPRO KIT) injection 6 mg, 6 mg, Subcutaneous, Once, 4 of 4 cycles Administration: 6 mg (01/30/2018), 6 mg (02/27/2018), 6 mg (03/28/2018), 6 mg (04/26/2018) cyclophosphamide  (CYTOXAN ) 1,000 mg in sodium chloride  0.9 % 250 mL chemo infusion, 600 mg/m2 = 1,000 mg, Intravenous,  Once, 4 of 4 cycles Administration: 1,000 mg (01/30/2018), 1,000 mg (02/27/2018), 1,000 mg (03/28/2018), 1,000 mg (04/26/2018) DOCEtaxel  (TAXOTERE ) 130 mg in sodium chloride  0.9 % 250 mL chemo infusion, 75 mg/m2 = 130 mg, Intravenous,  Once, 4 of 4 cycles Dose modification: 60 mg/m2 (original dose 75 mg/m2, Cycle 2, Reason: Dose not tolerated) Administration: 130 mg (01/30/2018), 100 mg (02/27/2018), 100 mg (03/28/2018), 100 mg (04/26/2018)  for chemotherapy treatment.    Non-small cell lung cancer (HCC)  03/09/2023 Initial Diagnosis   Non-small cell lung cancer (HCC)   03/20/2023 -  Chemotherapy   Patient is on Treatment Plan : LUNG Carboplatin  + Paclitaxel  q7d        Interval history-she has baseline fatigue and generalized body aches.  Appetite is fair.  Weight has remained stable  ECOG PS- 2 Pain scale- 3   Review of systems- Review of Systems  Constitutional:  Positive for malaise/fatigue. Negative for chills,  fever and weight loss.  HENT:  Negative for congestion, ear discharge and nosebleeds.   Eyes:  Negative for blurred vision.   Respiratory:  Negative for cough, hemoptysis, sputum production, shortness of breath and wheezing.   Cardiovascular:  Negative for chest pain, palpitations, orthopnea and claudication.  Gastrointestinal:  Negative for abdominal pain, blood in stool, constipation, diarrhea, heartburn, melena, nausea and vomiting.  Genitourinary:  Negative for dysuria, flank pain, frequency, hematuria and urgency.  Musculoskeletal:  Positive for back pain, joint pain and myalgias.  Skin:  Negative for rash.  Neurological:  Negative for dizziness, tingling, focal weakness, seizures, weakness and headaches.  Endo/Heme/Allergies:  Does not bruise/bleed easily.  Psychiatric/Behavioral:  Negative for depression and suicidal ideas. The patient does not have insomnia.       Allergies  Allergen Reactions   Sulfa  Antibiotics Other (See Comments)    Mouth sore and raw     Past Medical History:  Diagnosis Date   Acute pulmonary embolism (HCC) 05/10/2023   Adenomatous colon polyp    Adrenal carcinoma, right (HCC) 12/28/2016   a.) high grade carcinoma with extensive necrosis --> s/p adrenalectomy   Anxiety    Aortic atherosclerosis (HCC)    Arthritis of right knee    Atrial fibrillation and flutter (HCC)    a.) CHA2DS2-VASc = 3 (age, sex, vascular disease history) as of 02/13/2023; b.) cardiac rate/rhythm maintained intrinsically without pharmacological intervention; no chronic OAC (does take low dose ASA)   B12 deficiency    Breast cancer (HCC) 1997   a.) stage IB (cT1a, cN0, cM0, G3, ER+, PR-, HER2-); s/p lumpectomy + XRT   COPD (chronic obstructive pulmonary disease) (HCC)    Coronary artery disease    a.) cCTA 12/09/2021: Ca2+ = 135 (80th %'ile for age/sex/race match contol) --> 25-49% pLAD and RCA distributions   Depression    Diastolic dysfunction    a.) TTE 12/11/2020, no RWMAs, norm RVSF, G1DD; b.) TTE 12/14/2021: EF 55-60, no RWMAs, norm RVSF, G1DD; c.) TTE 01/27/2023: EF 50-55%, no RWMAs, G1DD,  mild RAE, mild MR, mod TR   Dry cough 03/02/2017   Dyspnea    Full dentures    GERD (gastroesophageal reflux disease)    History of hiatal hernia    HLD (hyperlipidemia)    Invasive carcinoma of breast (HCC) 11/15/2017   a.) path (+) for G3, ER -, PR -, HER2/neu - ; s/p lumpectomy 01/17/2018 + 4 cycles TC systemic chemotherapy + XRT   Long term current use of aspirin     Personal history of chemotherapy    Personal history of radiation therapy    Pneumothorax of left lung after biopsy 01/25/2023   a.) hypoxic in PACU requiring re-intubation --> transferred to ICU --> chest tube placed by PCCM   RLS (restless legs syndrome)    a.) on ropinirole    Skin cancer    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   a.) Stage IB (cT1a, cN0, cM0, G3, ER+, PR-, HER2-)     Past Surgical History:  Procedure Laterality Date   BIOPSY  01/05/2023   Procedure: BIOPSY;  Surgeon: Unk Corinn Skiff, MD;  Location: [facility];  Service: Gastroenterology;;   BREAST BIOPSY Right 1997   positive   BREAST BIOPSY Right 11/15/2017   INVASIVE MAMMARY CARCINOMA triple negative   BREAST BIOPSY Right 07/20/2020   us  bx, Q marker, neg   BREAST LUMPECTOMY Right 1997   2019 also   BREAST LUMPECTOMY WITH SENTINEL LYMPH NODE BIOPSY  Right 12/08/2017   Procedure: BREAST LUMPECTOMY WITH SENTINEL LYMPH NODE BX;  Surgeon: Dessa Reyes ORN, MD;  Location: [facility];  Service: General;  Laterality: Right;   BREAST REDUCTION WITH MASTOPEXY Left 06/10/2019   Procedure: Left breast mastopexy reduction for symmetry;  Surgeon: Lowery Estefana RAMAN, DO;  Location: [facility];  Service: Plastics;  Laterality: Left;  total 2.5 hours   CATARACT EXTRACTION W/PHACO Right 02/07/2022   Procedure: CATARACT EXTRACTION PHACO AND INTRAOCULAR LENS PLACEMENT (IOC) RIGHT DIABETIC;  Surgeon: Myrna Adine Anes, MD;  Location: [facility];  Service: Ophthalmology;  Laterality: Right;  3.60 00:34.7   CATARACT  EXTRACTION W/PHACO Left 02/28/2022   Procedure: CATARACT EXTRACTION PHACO AND INTRAOCULAR LENS PLACEMENT (IOC) LEFT DIABETIC 4.11 00:26.1;  Surgeon: Myrna Adine Anes, MD;  Location: [facility];  Service: Ophthalmology;  Laterality: Left;   COLONOSCOPY  2015   COLONOSCOPY N/A 01/05/2023   Procedure: COLONOSCOPY;  Surgeon: Unk Corinn Skiff, MD;  Location: [facility];  Service: Gastroenterology;  Laterality: N/A;   COLONOSCOPY WITH PROPOFOL  N/A 10/29/2019   Procedure: COLONOSCOPY WITH PROPOFOL ;  Surgeon: Jinny Carmine, MD;  Location: [facility] END[facility];  Service: Endoscopy;  Laterality: N/A;   ESOPHAGOGASTRODUODENOSCOPY (EGD) WITH PROPOFOL  N/A 05/24/2019   Procedure: ESOPHAGOGASTRODUODENOSCOPY (EGD) WITH PROPOFOL ;  Surgeon: Jinny Carmine, MD;  Location: [facility];  Service: Endoscopy;  Laterality: N/A;   ESOPHAGOGASTRODUODENOSCOPY (EGD) WITH PROPOFOL  N/A 09/06/2022   Procedure: ESOPHAGOGASTRODUODENOSCOPY (EGD) WITH PROPOFOL ;  Surgeon: Jinny Carmine, MD;  Location: [facility];  Service: Endoscopy;  Laterality: N/A;   ESOPHAGOGASTRODUODENOSCOPY (EGD) WITH PROPOFOL   01/05/2023   Procedure: ESOPHAGOGASTRODUODENOSCOPY (EGD) WITH PROPOFOL ;  Surgeon: Unk Corinn Skiff, MD;  Location: [facility];  Service: Gastroenterology;;   ESOPHAGUS SURGERY     GASTROSTOMY N/A 05/25/2023   Procedure: OPEN INSERTION OF GASTROSTOMY TUBE;  Surgeon: Jordis Laneta FALCON, MD;  Location: [facility];  Service: General;  Laterality: N/A;   IR IMAGING GUIDED PORT INSERTION  02/07/2023   IR REPLC GASTRO/COLONIC TUBE PERCUT W/FLUORO  09/12/2023   KNEE SURGERY Right    LIPOSUCTION WITH LIPOFILLING Right 06/10/2019   Procedure: right breast scar and capsule release with fat grafting;  Surgeon: Lowery Estefana RAMAN, DO;  Location: [facility];  Service: Plastics;  Laterality: Right;   POLYPECTOMY  01/05/2023   Procedure: POLYPECTOMY;  Surgeon: Unk Corinn Skiff, MD;  Location: [facility];  Service:  Gastroenterology;;   [facility] REMOVAL     PORTACATH PLACEMENT Right 01/17/2018   Procedure: INSERTION PORT-A-CATH- RIGHT;  Surgeon: Dessa Reyes ORN, MD;  Location: [facility];  Service: General;  Laterality: Right;   PULMONARY THROMBECTOMY Bilateral 05/12/2023   Procedure: PULMONARY THROMBECTOMY;  Surgeon: Marea Selinda RAMAN, MD;  Location: [facility];  Service: Cardiovascular;  Laterality: Bilateral;   ROBOTIC ADRENALECTOMY Right 12/28/2016   Procedure: ROBOTIC ADRENALECTOMY;  Surgeon: Penne Knee, MD;  Location: [facility];  Service: Urology;  Laterality: Right;   SKIN CANCER EXCISION     THROAT SURGERY  1998   throat cancer    TUBAL LIGATION     VENTRAL HERNIA REPAIR N/A 03/03/2017   Procedure: HERNIA REPAIR VENTRAL ADULT;  Surgeon: Shelva Dunnings, MD;  Location: [facility];  Service: General;  Laterality: N/A;   VIDEO BRONCHOSCOPY WITH ENDOBRONCHIAL ULTRASOUND N/A 01/25/2023   Procedure: VIDEO BRONCHOSCOPY WITH ENDOBRONCHIAL ULTRASOUND;  Surgeon: Parris Manna, MD;  Location: [facility];  Service: Thoracic;  Laterality: N/A;   VIDEO BRONCHOSCOPY WITH ENDOBRONCHIAL ULTRASOUND N/A 02/17/2023   Procedure: VIDEO  BRONCHOSCOPY WITH ENDOBRONCHIAL ULTRASOUND;  Surgeon: Parris Manna, MD;  Location: [facility];  Service: Thoracic;  Laterality: N/A;    Social History   Socioeconomic History   Marital status: Married    Spouse name: Shakesha, Soltau (Spouse) [phone] (Mobile)   Number of children: 4   Years of education: Not on file   Highest education level: 10th grade  Occupational History   Occupation: retired  Tobacco Use   Smoking status: Former    Current packs/day: 0.00    Average packs/day: 1.5 packs/day for 30.0 years (45.0 ttl pk-yrs)    Types: Cigarettes    Start date: 05/02/1965    Quit date: 05/03/1995    Years since quitting: 28.7    Passive exposure: Past   Smokeless tobacco: Never  Vaping Use   Vaping status: Never Used  Substance and Sexual Activity   Alcohol  use: Not Currently    Alcohol/week: 4.0 standard drinks of alcohol    Types: 4 Shots of liquor per week    Comment: none   Drug use: No   Sexual activity: Not Currently    Birth control/protection: Post-menopausal  Other Topics Concern   Not on file  Social History Narrative   Patient from home with husband.   Social Drivers of Corporate investment banker Strain: Low Risk  (12/06/2023)   Overall Financial Resource Strain (CARDIA)    Difficulty of Paying Living Expenses: Not hard at all  Food Insecurity: No Food Insecurity (12/06/2023)   Hunger Vital Sign    Worried About Running Out of Food in the Last Year: Never true    Ran Out of Food in the Last Year: Never true  Transportation Needs: No Transportation Needs (12/06/2023)   PRAPARE - Administrator, Civil Service (Medical): No    Lack of Transportation (Non-Medical): No  Physical Activity: Insufficiently Active (12/06/2023)   Exercise Vital Sign    Days of Exercise per Week: 3 days    Minutes of Exercise per Session: 30 min  Stress: No Stress Concern Present (12/06/2023)   [employer] of Occupational Health - Occupational Stress Questionnaire    Feeling of Stress: Only a little  Social Connections: Moderately Isolated (12/06/2023)   Social Connection and Isolation Panel    Frequency of Communication with Friends and Family: More than three times a week    Frequency of Social Gatherings with Friends and Family: Never    Attends Religious Services: Never    Database administrator or Organizations: No    Attends Banker Meetings: Never    Marital Status: Married  Catering manager Violence: Not At Risk (12/06/2023)   Humiliation, Afraid, Rape, and Kick questionnaire    Fear of Current or Ex-Partner: No    Emotionally Abused: No    Physically Abused: No    Sexually Abused: No    Family History  Problem Relation Age of Onset   Diabetes Mother    Heart attack Mother    Cancer Sister        cancer of  eyes, spread to lung, other places. died at 73   Cancer Sister 82       unknown primary, spread to bone, brain died in 82's   Cancer Paternal Aunt        unk type   Cancer Paternal Grandmother        type unk   Prostate cancer Neg Hx    Kidney cancer Neg Hx    Bladder  Cancer Neg Hx    Breast cancer Neg Hx      Current Outpatient Medications:    apixaban  (ELIQUIS ) 5 MG TABS tablet, Take 1 tablet (5 mg total) by mouth 2 (two) times daily., Disp: 60 tablet, Rfl: 3   benzonatate  (TESSALON ) 100 MG capsule, Take 2 capsules (200 mg total) by mouth 3 (three) times daily as needed for cough., Disp: , Rfl:    buPROPion  (WELLBUTRIN ) 75 MG tablet, Take 75 mg by mouth 2 (two) times daily., Disp: , Rfl:    busPIRone  (BUSPAR ) 5 MG tablet, Take 1 tablet (5 mg total) by mouth 3 (three) times daily as needed., Disp: 270 tablet, Rfl: 2   cyclobenzaprine  (FLEXERIL ) 5 MG tablet, Take 5 mg by mouth 3 (three) times daily as needed for muscle spasms., Disp: , Rfl:    DULoxetine  (CYMBALTA ) 60 MG capsule, Take 1 capsule (60 mg total) by mouth daily., Disp: 90 capsule, Rfl: 1   fentaNYL  (DURAGESIC ) 12 MCG/HR, Place 1 patch onto the skin every 3 (three) days., Disp: 10 patch, Rfl: 0   fluticasone  (FLONASE ) 50 MCG/ACT nasal spray, Place 2 sprays into both nostrils daily., Disp: , Rfl:    Fluticasone -Umeclidin-Vilant (TRELEGY ELLIPTA ) 100-62.5-25 MCG/ACT AEPB, Inhale 1 puff into the lungs daily., Disp: , Rfl:    gabapentin  (NEURONTIN ) 300 MG capsule, Take 300 mg by mouth 2 (two) times daily., Disp: , Rfl:    ipratropium-albuterol  (DUONEB) 0.5-2.5 (3) MG/3ML SOLN, Take 3 mLs by nebulization every 4 (four) hours as needed., Disp: , Rfl:    lactulose , encephalopathy, (CHRONULAC ) 10 GM/15ML SOLN, Place 30 mLs (20 g total) into feeding tube 2 (two) times daily., Disp: 946 mL, Rfl: 3   lidocaine  (LIDODERM ) 5 %, Place 1 patch onto the skin daily. Remove & Discard patch within 12 hours or as directed by MD, Disp: 30 patch,  Rfl: 2   meloxicam  (MOBIC ) 7.5 MG tablet, Take 1 tablet (7.5 mg total) by mouth daily., Disp: 90 tablet, Rfl: 1   metoprolol  succinate (TOPROL -XL) 25 MG 24 hr tablet, TAKE 1 TABLET BY MOUTH DAILY, Disp: 30 tablet, Rfl: 4   montelukast  (SINGULAIR ) 10 MG tablet, TAKE 1 TABLET BY MOUTH AT BEDTIME, Disp: 90 tablet, Rfl: 0   Nutritional Supplements (NUTREN 1.5) LIQD, Give 1 carton 4 times a day (8am, noon, 4pm and 8pm). Flush with 60ml of water  before and after each feeding.  Give additional 1 1/2 cups of water  a day via tube or drink orally for adequate hydration., Disp: , Rfl:    omeprazole  (KONVOMEP ) 2 mg/mL SUSP oral suspension, Take 20 mLs (40 mg total) by mouth daily., Disp: 240 mL, Rfl: 1   ondansetron  (ZOFRAN ) 4 MG tablet, Take 1 tablet (4 mg total) by mouth every 6 (six) hours as needed for nausea or vomiting., Disp: , Rfl:    oxyCODONE  (ROXICODONE ) 5 MG/5ML solution, Take 5 mLs (5 mg total) by mouth every 8 (eight) hours as needed for severe pain (pain score 7-10)., Disp: 200 mL, Rfl: 0   rOPINIRole  (REQUIP ) 0.5 MG tablet, Take 1 tablet (0.5 mg total) by mouth 3 (three) times daily., Disp: 90 tablet, Rfl: 1   rosuvastatin  (CRESTOR ) 40 MG tablet, Place 1 tablet (40 mg total) into feeding tube daily., Disp: 90 tablet, Rfl: 0   senna (SENOKOT) 8.6 MG TABS tablet, Place 1 tablet (8.6 mg total) into feeding tube daily., Disp: , Rfl:    sucralfate  (CARAFATE ) 1 GM/10ML suspension, Place 10 mLs (1 g total) into  feeding tube 4 (four) times daily., Disp: 1200 mL, Rfl: 2   tolterodine  (DETROL  LA) 4 MG 24 hr capsule, Take 4 mg by mouth daily., Disp: , Rfl:    triamcinolone  cream (KENALOG ) 0.1 %, Apply 1 Application topically 2 (two) times daily., Disp: , Rfl:    Water  For Irrigation, Sterile (FREE WATER ) SOLN, Place 60 mLs into feeding tube 5 (five) times daily., Disp: , Rfl:    albuterol  (VENTOLIN  HFA) 108 (90 Base) MCG/ACT inhaler, Inhale 2 puffs into the lungs every 6 (six) hours as needed for wheezing or  shortness of breath. (Patient not taking: Reported on 01/16/2024), Disp: 51 each, Rfl: 2   bisacodyl  (DULCOLAX) 10 MG suppository, Place 1 suppository (10 mg total) rectally daily as needed for moderate constipation or severe constipation. (Patient not taking: Reported on 01/16/2024), Disp: , Rfl:    cefdinir  (OMNICEF ) 300 MG capsule, Take 1 capsule (300 mg total) by mouth 2 (two) times daily. (Patient not taking: Reported on 01/05/2024), Disp: 20 capsule, Rfl: 0   Fluticasone -Umeclidin-Vilant (TRELEGY ELLIPTA ) 100-62.5-25 MCG/ACT AEPB, Inhale 1 Act into the lungs daily., Disp: 1 each, Rfl: 5   predniSONE  (DELTASONE ) 20 MG tablet, Take 1 tablet (20 mg total) by mouth daily with breakfast. 7 days (Patient not taking: Reported on 01/05/2024), Disp: 7 tablet, Rfl: 1   simethicone  (MYLICON) 80 MG chewable tablet, Place 1 tablet (80 mg total) into feeding tube 4 (four) times daily. (Patient not taking: Reported on 01/05/2024), Disp: , Rfl:  No current facility-administered medications for this visit.  Facility-Administered Medications Ordered in Other Visits:    cyanocobalamin  ((VITAMIN B-12)) injection 1,000 mcg, 1,000 mcg, Intramuscular, Q30 days, Melanee Annah BROCKS, MD, 1,000 mcg at 08/27/21 1537   cyanocobalamin  (VITAMIN B12) injection 1,000 mcg, 1,000 mcg, Intramuscular, Q30 days, Melanee Annah BROCKS, MD, 1,000 mcg at 03/16/22 1101  Physical exam:  Vitals:   01/22/24 1204  BP: (!) 110/40  Pulse: 65  Resp: 20  Temp: (!) 96.6 F (35.9 C)  TempSrc: Tympanic  SpO2: 100%  Weight: 108 lb 1.6 oz (49 kg)  Height: 5' 1 (1.549 m)   Physical Exam Constitutional:      Comments: She appears fatigued and cachectic  Eyes:     Pupils: Pupils are equal, round, and reactive to light.  Cardiovascular:     Rate and Rhythm: Normal rate and regular rhythm.     Heart sounds: Normal heart sounds.  Pulmonary:     Effort: Pulmonary effort is normal.     Breath sounds: Normal breath sounds.  Abdominal:     General: Bowel  sounds are normal.     Palpations: Abdomen is soft.  Skin:    General: Skin is warm and dry.  Neurological:     Mental Status: She is alert and oriented to person, place, and time.      I have personally reviewed labs listed below:    Latest Ref Rng & Units 01/05/2024   10:20 AM  CMP  Glucose 70 - 99 mg/dL 74   BUN 8 - 23 mg/dL 25   Creatinine 9.55 - 1.00 mg/dL 9.30   Sodium 864 - 854 mmol/L 137   Potassium 3.5 - 5.1 mmol/L 4.3   Chloride 98 - 111 mmol/L 104   CO2 22 - 32 mmol/L 25   Calcium  8.9 - 10.3 mg/dL 9.0   Total Protein 6.5 - 8.1 g/dL 6.5   Total Bilirubin 0.0 - 1.2 mg/dL 0.5   Alkaline Phos 38 -  126 U/L 72   AST 15 - 41 U/L 48   ALT 0 - 44 U/L 48       Latest Ref Rng & Units 01/16/2024   11:17 AM  CBC  WBC 4.0 - 10.5 K/uL 5.8   Hemoglobin 12.0 - 15.0 g/dL 86.7   Hematocrit 63.9 - 46.0 % 41.4   Platelets 150 - 400 K/uL 182    I have personally reviewed Radiology images listed below: No images are attached to the encounter.  CT BONE TROCAR/NEEDLE BIOPSY SUPERFICIAL Result Date: 01/16/2024 INDICATION: 68 year old with history of breast and vocal cord carcinoma. Patient has multiple pulmonary nodules and bone lesions. Tissue diagnosis is needed. EXAM: CT-GUIDED BIOPSY OF LEFT SCAPULA LYTIC LESION TECHNIQUE: Multidetector CT imaging of the chest was performed following the standard protocol without IV contrast. RADIATION DOSE REDUCTION: This exam was performed according to the departmental dose-optimization program which includes automated exposure control, adjustment of the mA and/or kV according to patient size and/or use of iterative reconstruction technique. MEDICATIONS: Moderate sedation ANESTHESIA/SEDATION: Moderate (conscious) sedation was employed during this procedure. A total of Versed  1.5 mg and Fentanyl  75 mcg was administered intravenously by the radiology nurse. Total intra-service moderate Sedation Time: 16 minutes. The patient's level of consciousness and  vital signs were monitored continuously by radiology nursing throughout the procedure under my direct supervision. COMPLICATIONS: None immediate. PROCEDURE: Informed written consent was obtained from the patient after a thorough discussion of the procedural risks, benefits and alternatives. All questions were addressed. A timeout was performed prior to the initiation of the procedure. Patient was placed prone. CT images through the chest were obtained. Lytic lesion along the inferior aspect of the left scapula was identified and targeted. Left side of the back was prepped with chlorhexidine . Skin was anesthetized using 1% lidocaine . Using CT guidance, 17 gauge coaxial needle was directed into the lytic lesion. Two core biopsies were obtained with an 18 gauge core device. Specimens placed in formalin. Needle was removed. Bandage placed over the puncture site. FINDINGS: Lytic lesion along the inferior aspect of the left scapula. Coaxial needle placed within the soft tissue component of this lytic lesion. Two soft tissue core biopsies were obtained. IMPRESSION: CT-guided core biopsies of the lytic left scapular lesion. Electronically Signed   By: Juliene Balder M.D.   On: 01/16/2024 16:23   NM PET Image Restag (PS) Skull Base To Thigh Result Date: 01/03/2024 CLINICAL DATA:  Subsequent treatment strategy for non-small cell lung cancer. EXAM: NUCLEAR MEDICINE PET SKULL BASE TO THIGH TECHNIQUE: 5.3 mCi F-18 FDG was injected intravenously. Full-ring PET imaging was performed from the skull base to thigh after the radiotracer. CT data was obtained and used for attenuation correction and anatomic localization. Fasting blood glucose: 108 mg/dl COMPARISON:  CT chest abdomen pelvis 12/13/2023 and PET 12/19/2022. FINDINGS: Mediastinal blood pool activity: SUV max 2.2 Liver activity: SUV max NA NECK: Abnormal hypermetabolism. Incidental CT findings: None. CHEST: Hypermetabolic left supraclavicular mediastinal and left axillary  lymph nodes, some of which have resolved in the interval and others of which are new. Index new high prevascular lymph node measures 7 mm (6/36), SUV max 6.5. Previously seen low paratracheal lymph nodes have resolved. Widespread new hypermetabolic hematogenously distributed pulmonary nodules. Incidental CT findings: Right IJ Port-A-Cath terminates in the high right atrium. Atherosclerotic calcification of the aorta, aortic valve and coronary arteries. Heart is enlarged. No pericardial or pleural effusion. Post radiation scarring in the perihilar left upper lobe. ABDOMEN/PELVIS: No abnormal hypermetabolism.  Incidental CT findings: Small low-attenuation lesion off the left kidney. No specific follow-up necessary. Percutaneous gastrostomy. Small hiatal hernia. Atherosclerotic calcification of the aorta. SKELETON: Progressive osseous metastatic lesions with new hypermetabolic metastases in the lateral left ninth rib and left scapula. Previously seen hypermetabolic right ninth posterolateral metastasis. Incidental CT findings: Degenerative changes in the spine. IMPRESSION: 1. Overall disease progression as evidenced by new mediastinal adenopathy, widespread pulmonary nodules and new osseous lesions. 2. Aortic atherosclerosis (ICD10-I70.0). Coronary artery calcification. Electronically Signed   By: Newell Eke M.D.   On: 01/03/2024 13:26     Assessment and plan- Patient is a 68 y.o. female with history of metastatic non-small cell lung cancer with bone and lymph node metastasis 0 discussed with the management  Patient has been treated for multiple malignancies in the past including breast cancer, adrenal carcinoma of unknown primary.  Most recently for locally advanced lung cancer last year when she underwent concurrent chemo radiation with weekly CarboTaxol chemotherapy.  She tolerated this poorly requiring multiple hospitalizations as well as PEG tube placement.  Most recent PET CT scan shows increase in the  size of bilateral lung nodules as well as increasing mediastinal and axillary adenopathy as well as left supraclavicular adenopathy and multiple areas of bone metastases.  She did underwent CT-guided bone biopsy of the scapular lesion which confirms adenocarcinoma of lung primary.  Cells were positive for CK7 and TTF-1 and negative for CK5/6, CK20 p40 PAX8 and GATA3.  This was consistent with lung primary.  At this time I am awaiting results of [employer] testing to see if she would be a candidate for any targetable mutations or if she has high PD-L1 expression in which case we can do single agent immunotherapy.  If she does not have either of these then ideally she needs systemic chemotherapy which I do not think she will tolerate well.  Patient herself does not desire chemotherapy.  I will see her back again in about 2 weeks time to discuss the results of [employer] testing and further management.  I will also have her meet palliative care at that time for further goals of care conversations.  Treatment will be palliative and not curative.  Without any further treatment her overall life expectancy is likely 6 months and she would be hospice eligible although she does not wish to consider that yet.    Neoplasm related pain: She is on as needed oxycodone  which we will continue   Visit Diagnosis 1. Primary malignant neoplasm of right lung metastatic to other site [facility])   2. Goals of care, counseling/discussion      Dr. Annah Skene, MD, MPH [facility] [phone] 01/22/2024 12:23 PM

## 2024-01-23 DIAGNOSIS — C3490 Malignant neoplasm of unspecified part of unspecified bronchus or lung: Secondary | ICD-10-CM | POA: Diagnosis not present

## 2024-01-25 ENCOUNTER — Encounter: Payer: Self-pay | Admitting: Oncology

## 2024-01-26 ENCOUNTER — Inpatient Hospital Stay

## 2024-01-26 ENCOUNTER — Encounter: Payer: Self-pay | Admitting: Oncology

## 2024-01-26 ENCOUNTER — Ambulatory Visit: Admitting: Oncology

## 2024-01-26 ENCOUNTER — Telehealth: Payer: Self-pay

## 2024-01-26 DIAGNOSIS — Z23 Encounter for immunization: Secondary | ICD-10-CM

## 2024-01-26 DIAGNOSIS — Z8521 Personal history of malignant neoplasm of larynx: Secondary | ICD-10-CM | POA: Diagnosis not present

## 2024-01-26 DIAGNOSIS — Z85819 Personal history of malignant neoplasm of unspecified site of lip, oral cavity, and pharynx: Secondary | ICD-10-CM | POA: Diagnosis not present

## 2024-01-26 DIAGNOSIS — R590 Localized enlarged lymph nodes: Secondary | ICD-10-CM | POA: Diagnosis not present

## 2024-01-26 DIAGNOSIS — G893 Neoplasm related pain (acute) (chronic): Secondary | ICD-10-CM | POA: Diagnosis not present

## 2024-01-26 DIAGNOSIS — R918 Other nonspecific abnormal finding of lung field: Secondary | ICD-10-CM | POA: Diagnosis not present

## 2024-01-26 DIAGNOSIS — E538 Deficiency of other specified B group vitamins: Secondary | ICD-10-CM | POA: Diagnosis not present

## 2024-01-26 DIAGNOSIS — C3491 Malignant neoplasm of unspecified part of right bronchus or lung: Secondary | ICD-10-CM | POA: Diagnosis not present

## 2024-01-26 DIAGNOSIS — Z853 Personal history of malignant neoplasm of breast: Secondary | ICD-10-CM | POA: Diagnosis not present

## 2024-01-26 DIAGNOSIS — C7951 Secondary malignant neoplasm of bone: Secondary | ICD-10-CM | POA: Diagnosis not present

## 2024-01-26 MED ORDER — CYANOCOBALAMIN 1000 MCG/ML IJ SOLN
1000.0000 ug | Freq: Once | INTRAMUSCULAR | Status: AC
  Administered 2024-01-26: 1000 ug via INTRAMUSCULAR
  Filled 2024-01-26: qty 1

## 2024-01-26 NOTE — Progress Notes (Signed)
 Nutrition Follow-up:  Patient with metastatic non small cell lung cancer with bone and lymph node metastasis.  Followed by Dr Melanee. Awaiting results of tempus testing for final plan.  PEG placed on 05/22/22 during hospital admission because of dysphagia, aspiration pneumonia.  PEG replaced on 06/23/23 in ED (14 Fr) and replaced by IR on 09/12/23 (18 Fr).    Met with patient after injection with daughter.  Reports that she is giving 3 cartons of nutren 1.5 via feeding tube.  Usually eats pureed foods at supper time.  Although last night was able to eat pureed cucumber with mayo on bread (no crust).  Having normal bowel movement usually daily.  Reports some nausea but thinks it is related to her coffee.  Medications are given via feeding tube.  Reports that feeding tube hurts and bleeds.  Has been putting dressing under bumper but still is painful.      Medications: reviewed  Labs: reviewed  Anthropometrics:   Weight 106 lb today in clinic 106 lb 4 oz on 7/25  107 lb 2 oz on 4/25 109 lb on 3/26 111 lb on 2/21 117 lb on 2/3 119 lb on 4/3 oz on 05/30/23   Estimated Energy Needs  Kcals: 1500-1750 Protein: 50-60 g Fluid: 1500-1750 ml  NUTRITION DIAGNOSIS: Inadequate oral intake continues relying on feeding tube    INTERVENTION:  Patient will ok to see NP/MD next week to discuss PEG issues and possible referral to IR/general surgery Recommend 4 cartons of nutren 1.5 via tube for more nutrition Will provide 1500 calories, 68 g protein, 764 ml free water .  Patient flushing with 60ml of water  before and after each feeding.  Sipping on liquids during the day orally as well.      MONITORING, EVALUATION, GOAL: weight trends, tube feeding   NEXT VISIT: as needed, await plan of care  Odes Lolli B. Dasie SOLON, CSO, LDN Registered Dietitian [phone]

## 2024-01-26 NOTE — Telephone Encounter (Addendum)
 Fax received for peer to peer for [employer] [id number] for DOS 01/10/2024 - 04/22/24.  Will be Routed to medical direct for further review; medical director will reach out to provider upon a decision being made; peer to peer appointments are not being made at this time. If medical director is unable to reach provider directly they will leave a message with a contact number.  Call reference # [id number] (same as [employer] reference number listed on fax).  Patient's member ID [id number].

## 2024-01-29 DIAGNOSIS — C3490 Malignant neoplasm of unspecified part of unspecified bronchus or lung: Secondary | ICD-10-CM | POA: Diagnosis not present

## 2024-01-30 ENCOUNTER — Encounter: Payer: Self-pay | Admitting: Oncology

## 2024-01-31 ENCOUNTER — Encounter: Payer: Self-pay | Admitting: Oncology

## 2024-02-01 DIAGNOSIS — C3490 Malignant neoplasm of unspecified part of unspecified bronchus or lung: Secondary | ICD-10-CM | POA: Diagnosis not present

## 2024-02-01 DIAGNOSIS — C320 Malignant neoplasm of glottis: Secondary | ICD-10-CM | POA: Diagnosis not present

## 2024-02-01 DIAGNOSIS — R1310 Dysphagia, unspecified: Secondary | ICD-10-CM | POA: Diagnosis not present

## 2024-02-02 ENCOUNTER — Telehealth: Payer: Self-pay | Admitting: Pharmacy Technician

## 2024-02-02 ENCOUNTER — Inpatient Hospital Stay: Admitting: Hospice and Palliative Medicine

## 2024-02-02 ENCOUNTER — Other Ambulatory Visit: Payer: Self-pay

## 2024-02-02 ENCOUNTER — Inpatient Hospital Stay: Attending: Oncology | Admitting: Oncology

## 2024-02-02 ENCOUNTER — Encounter: Payer: Self-pay | Admitting: Oncology

## 2024-02-02 ENCOUNTER — Telehealth: Payer: Self-pay | Admitting: Pharmacist

## 2024-02-02 ENCOUNTER — Other Ambulatory Visit (HOSPITAL_COMMUNITY): Payer: Self-pay

## 2024-02-02 VITALS — BP 101/66 | HR 70 | Temp 96.7°F | Resp 17 | Wt 113.0 lb

## 2024-02-02 DIAGNOSIS — Z923 Personal history of irradiation: Secondary | ICD-10-CM | POA: Diagnosis not present

## 2024-02-02 DIAGNOSIS — Z9221 Personal history of antineoplastic chemotherapy: Secondary | ICD-10-CM | POA: Insufficient documentation

## 2024-02-02 DIAGNOSIS — Z7189 Other specified counseling: Secondary | ICD-10-CM

## 2024-02-02 DIAGNOSIS — C3491 Malignant neoplasm of unspecified part of right bronchus or lung: Secondary | ICD-10-CM

## 2024-02-02 DIAGNOSIS — R21 Rash and other nonspecific skin eruption: Secondary | ICD-10-CM | POA: Diagnosis not present

## 2024-02-02 DIAGNOSIS — E538 Deficiency of other specified B group vitamins: Secondary | ICD-10-CM | POA: Diagnosis not present

## 2024-02-02 DIAGNOSIS — C7951 Secondary malignant neoplasm of bone: Secondary | ICD-10-CM | POA: Insufficient documentation

## 2024-02-02 DIAGNOSIS — C779 Secondary and unspecified malignant neoplasm of lymph node, unspecified: Secondary | ICD-10-CM | POA: Insufficient documentation

## 2024-02-02 DIAGNOSIS — Z808 Family history of malignant neoplasm of other organs or systems: Secondary | ICD-10-CM | POA: Insufficient documentation

## 2024-02-02 DIAGNOSIS — Z853 Personal history of malignant neoplasm of breast: Secondary | ICD-10-CM | POA: Diagnosis not present

## 2024-02-02 DIAGNOSIS — Z8521 Personal history of malignant neoplasm of larynx: Secondary | ICD-10-CM | POA: Insufficient documentation

## 2024-02-02 DIAGNOSIS — Z87891 Personal history of nicotine dependence: Secondary | ICD-10-CM | POA: Diagnosis not present

## 2024-02-02 MED ORDER — AFATINIB DIMALEATE 30 MG PO TABS: 30.0000 mg | ORAL_TABLET | Freq: Every day | ORAL | 1 refills | Status: DC

## 2024-02-02 NOTE — Progress Notes (Signed)
 Hematology/Oncology Consult note [facility]  [phone] [phone]  Patient Care Team: [name], DO as PCP - General (Family Medicine) Darliss Rogue, MD as PCP - Cardiology (Cardiology) Melanee Annah BROCKS, MD as Consulting Physician (Oncology) Jordis Laneta FALCON, MD as Consulting Physician (General Surgery) Dasher, Alm LABOR, MD (Dermatology) Scheeler, Donnice PARAS, PA-C as Physician Assistant (Plastic Surgery) Dorothyann Drivers, MD as Attending Physician (Emergency Medicine) Ferrel Lionel Sotero RIGGERS (Neurology) French Mering, MD as Referring Physician (Internal Medicine) Shellia Oh, MD as Consulting Physician (Pulmonary Disease) [facility], [facility] (Optometry) Verdene Gills, RN as Oncology Nurse Navigator Lenn Aran, MD as Consulting Physician (Radiation Oncology) Moises Reusing, RN as [facility], [place], LCSW as [facility] [place], [place], [place]   Name of the patient: Denise Watts  [id number]  1956-01-05   Date of visit: 02/02/24  Diagnosis-metastatic non-small cell lung cancer  Chief complaint/ Reason for visit-discuss further management of lung cancer  Heme/Onc history:  Oncology History Overview Note  1. Carcinoma of breast, T1, N0, M0 tumor diagnosis.  In January of 1997 2. Carcinoma of the vocal cord T1, N0, M0 tumor.  Status postradiation therapy 3. Abnormal CT scan of the chest (December, 2015) 4. Repeat CT scan of chest shows stable nodule (July, 2016) 5. Neutropenia with normal hemoglobin and normal platelet count (July, 2016)   Cancer of vocal cord [facility]) (Resolved)  11/10/2014 Initial Diagnosis   Cancer of vocal cord   Malignant neoplasm of lower-outer quadrant of right breast of female, estrogen receptor negative (HCC)  11/24/2017 Initial Diagnosis   Malignant neoplasm of lower-outer quadrant of right breast of female, estrogen receptor negative (HCC)    01/12/2018 Cancer Staging   Staging form: Breast, AJCC 8th Edition - Clinical stage from 01/12/2018: Stage IB (cT1a, cN0, cM0, G3, ER+, PR-, HER2-) - Signed by Melanee Annah BROCKS, MD on 01/12/2018   01/30/2018 - 04/26/2018 Chemotherapy   The patient had dexamethasone  (DECADRON ) 4 MG tablet, 8 mg, Oral, 2 times daily, 1 of 1 cycle, Start date: 01/12/2018, End date: 08/03/2018 palonosetron  (ALOXI ) injection 0.25 mg, 0.25 mg, Intravenous,  Once, 4 of 4 cycles Administration: 0.25 mg (01/30/2018), 0.25 mg (02/27/2018), 0.25 mg (03/28/2018), 0.25 mg (04/26/2018) pegfilgrastim  (NEULASTA  ONPRO KIT) injection 6 mg, 6 mg, Subcutaneous, Once, 4 of 4 cycles Administration: 6 mg (01/30/2018), 6 mg (02/27/2018), 6 mg (03/28/2018), 6 mg (04/26/2018) cyclophosphamide  (CYTOXAN ) 1,000 mg in sodium chloride  0.9 % 250 mL chemo infusion, 600 mg/m2 = 1,000 mg, Intravenous,  Once, 4 of 4 cycles Administration: 1,000 mg (01/30/2018), 1,000 mg (02/27/2018), 1,000 mg (03/28/2018), 1,000 mg (04/26/2018) DOCEtaxel  (TAXOTERE ) 130 mg in sodium chloride  0.9 % 250 mL chemo infusion, 75 mg/m2 = 130 mg, Intravenous,  Once, 4 of 4 cycles Dose modification: 60 mg/m2 (original dose 75 mg/m2, Cycle 2, Reason: Dose not tolerated) Administration: 130 mg (01/30/2018), 100 mg (02/27/2018), 100 mg (03/28/2018), 100 mg (04/26/2018)  for chemotherapy treatment.    Non-small cell lung cancer (HCC)  03/09/2023 Initial Diagnosis   Non-small cell lung cancer (HCC)   03/20/2023 -  Chemotherapy   Patient is on Treatment Plan : LUNG Carboplatin  + Paclitaxel  q7d     Primary malignant neoplasm of right lung metastatic to other site [facility])  01/22/2024 Initial Diagnosis   Primary malignant neoplasm of right lung metastatic to other site [facility])   01/22/2024 Cancer Staging   Staging form: Lung, AJCC V9 - Clinical stage from 01/22/2024: rcT4, rcN3,  rpM1 - Signed by Melanee Annah BROCKS, MD on 01/22/2024 Stage prefix: Recurrence    NGS testing from scapular biopsy  showed evidence of eGFR point mutation P.G719S and P.V769M  Interval history-she is here with her husband today.  She has generalized fatigue and diffuse joint pains.  ECOG PS- 2 Pain scale- 4 Opioid associated constipation- no  Review of systems- Review of Systems  Constitutional:  Positive for malaise/fatigue. Negative for chills, fever and weight loss.  HENT:  Negative for congestion, ear discharge and nosebleeds.   Eyes:  Negative for blurred vision.  Respiratory:  Negative for cough, hemoptysis, sputum production, shortness of breath and wheezing.   Cardiovascular:  Negative for chest pain, palpitations, orthopnea and claudication.  Gastrointestinal:  Negative for abdominal pain, blood in stool, constipation, diarrhea, heartburn, melena, nausea and vomiting.  Genitourinary:  Negative for dysuria, flank pain, frequency, hematuria and urgency.  Musculoskeletal:  Positive for joint pain. Negative for back pain and myalgias.  Skin:  Negative for rash.  Neurological:  Negative for dizziness, tingling, focal weakness, seizures, weakness and headaches.  Endo/Heme/Allergies:  Does not bruise/bleed easily.  Psychiatric/Behavioral:  Negative for depression and suicidal ideas. The patient does not have insomnia.       Allergies  Allergen Reactions   Sulfa  Antibiotics Other (See Comments)    Mouth sore and raw     Past Medical History:  Diagnosis Date   Acute pulmonary embolism (HCC) 05/10/2023   Adenomatous colon polyp    Adrenal carcinoma, right (HCC) 12/28/2016   a.) high grade carcinoma with extensive necrosis --> s/p adrenalectomy   Anxiety    Aortic atherosclerosis    Arthritis of right knee    Atrial fibrillation and flutter (HCC)    a.) CHA2DS2-VASc = 3 (age, sex, vascular disease history) as of 02/13/2023; b.) cardiac rate/rhythm maintained intrinsically without pharmacological intervention; no chronic OAC (does take low dose ASA)   B12 deficiency    Breast cancer (HCC)  1997   a.) stage IB (cT1a, cN0, cM0, G3, ER+, PR-, HER2-); s/p lumpectomy + XRT   COPD (chronic obstructive pulmonary disease) (HCC)    Coronary artery disease    a.) cCTA 12/09/2021: Ca2+ = 135 (80th %'ile for age/sex/race match contol) --> 25-49% pLAD and RCA distributions   Depression    Diastolic dysfunction    a.) TTE 12/11/2020, no RWMAs, norm RVSF, G1DD; b.) TTE 12/14/2021: EF 55-60, no RWMAs, norm RVSF, G1DD; c.) TTE 01/27/2023: EF 50-55%, no RWMAs, G1DD, mild RAE, mild MR, mod TR   Dry cough 03/02/2017   Dyspnea    Full dentures    GERD (gastroesophageal reflux disease)    History of hiatal hernia    HLD (hyperlipidemia)    Invasive carcinoma of breast (HCC) 11/15/2017   a.) path (+) for G3, ER -, PR -, HER2/neu - ; s/p lumpectomy 01/17/2018 + 4 cycles TC systemic chemotherapy + XRT   Long term current use of aspirin     Personal history of chemotherapy    Personal history of radiation therapy    Pneumothorax of left lung after biopsy 01/25/2023   a.) hypoxic in PACU requiring re-intubation --> transferred to ICU --> chest tube placed by PCCM   RLS (restless legs syndrome)    a.) on ropinirole    Skin cancer    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   a.) Stage IB (cT1a, cN0, cM0, G3, ER+, PR-, HER2-)     Past Surgical History:  Procedure Laterality Date  BIOPSY  01/05/2023   Procedure: BIOPSY;  Surgeon: Unk Corinn Skiff, MD;  Location: [facility] ENDOSCOPY;  Service: Gastroenterology;;   BREAST BIOPSY Right 1997   positive   BREAST BIOPSY Right 11/15/2017   INVASIVE MAMMARY CARCINOMA triple negative   BREAST BIOPSY Right 07/20/2020   us  bx, Q marker, neg   BREAST LUMPECTOMY Right 1997   2019 also   BREAST LUMPECTOMY WITH SENTINEL LYMPH NODE BIOPSY Right 12/08/2017   Procedure: BREAST LUMPECTOMY WITH SENTINEL LYMPH NODE BX;  Surgeon: Dessa Reyes ORN, MD;  Location: [facility] ORS;  Service: General;  Laterality: Right;   BREAST REDUCTION WITH MASTOPEXY Left  06/10/2019   Procedure: Left breast mastopexy reduction for symmetry;  Surgeon: Lowery Estefana RAMAN, DO;  Location: [facility];  Service: Plastics;  Laterality: Left;  total 2.5 hours   CATARACT EXTRACTION W/PHACO Right 02/07/2022   Procedure: CATARACT EXTRACTION PHACO AND INTRAOCULAR LENS PLACEMENT (IOC) RIGHT DIABETIC;  Surgeon: Myrna Adine Anes, MD;  Location: [facility];  Service: Ophthalmology;  Laterality: Right;  3.60 00:34.7   CATARACT EXTRACTION W/PHACO Left 02/28/2022   Procedure: CATARACT EXTRACTION PHACO AND INTRAOCULAR LENS PLACEMENT (IOC) LEFT DIABETIC 4.11 00:26.1;  Surgeon: Myrna Adine Anes, MD;  Location: [facility];  Service: Ophthalmology;  Laterality: Left;   COLONOSCOPY  2015   COLONOSCOPY N/A 01/05/2023   Procedure: COLONOSCOPY;  Surgeon: Unk Corinn Skiff, MD;  Location: [facility] ENDOSCOPY;  Service: Gastroenterology;  Laterality: N/A;   COLONOSCOPY WITH PROPOFOL  N/A 10/29/2019   Procedure: COLONOSCOPY WITH PROPOFOL ;  Surgeon: Jinny Carmine, MD;  Location: [facility] END[facility]COPY;  Service: Endoscopy;  Laterality: N/A;   ESOPHAGOGASTRODUODENOSCOPY (EGD) WITH PROPOFOL  N/A 05/24/2019   Procedure: ESOPHAGOGASTRODUODENOSCOPY (EGD) WITH PROPOFOL ;  Surgeon: Jinny Carmine, MD;  Location: [facility] ENDOSCOPY;  Service: Endoscopy;  Laterality: N/A;   ESOPHAGOGASTRODUODENOSCOPY (EGD) WITH PROPOFOL  N/A 09/06/2022   Procedure: ESOPHAGOGASTRODUODENOSCOPY (EGD) WITH PROPOFOL ;  Surgeon: Jinny Carmine, MD;  Location: [facility] ENDOSCOPY;  Service: Endoscopy;  Laterality: N/A;   ESOPHAGOGASTRODUODENOSCOPY (EGD) WITH PROPOFOL   01/05/2023   Procedure: ESOPHAGOGASTRODUODENOSCOPY (EGD) WITH PROPOFOL ;  Surgeon: Unk Corinn Skiff, MD;  Location: [facility] ENDOSCOPY;  Service: Gastroenterology;;   ESOPHAGUS SURGERY     GASTROSTOMY N/A 05/25/2023   Procedure: OPEN INSERTION OF GASTROSTOMY TUBE;  Surgeon: Jordis Laneta FALCON, MD;  Location: [facility] ORS;  Service: General;  Laterality: N/A;   IR IMAGING  GUIDED PORT INSERTION  02/07/2023   IR REPLC GASTRO/COLONIC TUBE PERCUT W/FLUORO  09/12/2023   KNEE SURGERY Right    LIPOSUCTION WITH LIPOFILLING Right 06/10/2019   Procedure: right breast scar and capsule release with fat grafting;  Surgeon: Lowery Estefana RAMAN, DO;  Location: [facility];  Service: Plastics;  Laterality: Right;   POLYPECTOMY  01/05/2023   Procedure: POLYPECTOMY;  Surgeon: Unk Corinn Skiff, MD;  Location: [facility] ENDOSCOPY;  Service: Gastroenterology;;   [facility] REMOVAL     PORTACATH PLACEMENT Right 01/17/2018   Procedure: INSERTION PORT-A-CATH- RIGHT;  Surgeon: Dessa Reyes ORN, MD;  Location: [facility] ORS;  Service: General;  Laterality: Right;   PULMONARY THROMBECTOMY Bilateral 05/12/2023   Procedure: PULMONARY THROMBECTOMY;  Surgeon: Marea Selinda RAMAN, MD;  Location: [facility] INV[facility]IVE [facility];  Service: Cardiovascular;  Laterality: Bilateral;   ROBOTIC ADRENALECTOMY Right 12/28/2016   Procedure: ROBOTIC ADRENALECTOMY;  Surgeon: Penne Knee, MD;  Location: [facility] OR[facility];  Service: Urology;  Laterality: Right;   SKIN CANCER EXCISION     THROAT SURGERY  1998   throat cancer    TUBAL LIGATION  VENTRAL HERNIA REPAIR N/A 03/03/2017   Procedure: HERNIA REPAIR VENTRAL ADULT;  Surgeon: Shelva Dunnings, MD;  Location: [facility];  Service: General;  Laterality: N/A;   VIDEO BRONCHOSCOPY WITH ENDOBRONCHIAL ULTRASOUND N/A 01/25/2023   Procedure: VIDEO BRONCHOSCOPY WITH ENDOBRONCHIAL ULTRASOUND;  Surgeon: Parris Manna, MD;  Location: [facility];  Service: Thoracic;  Laterality: N/A;   VIDEO BRONCHOSCOPY WITH ENDOBRONCHIAL ULTRASOUND N/A 02/17/2023   Procedure: VIDEO BRONCHOSCOPY WITH ENDOBRONCHIAL ULTRASOUND;  Surgeon: Parris Manna, MD;  Location: [facility];  Service: Thoracic;  Laterality: N/A;    Social History   Socioeconomic History   Marital status: Married    Spouse name: Glacken,Joseph (Spouse) [phone] ([place])   Number of children: 4   Years of education: Not  on file   Highest education level: 10th grade  Occupational History   Occupation: retired  Tobacco Use   Smoking status: Former    Current packs/day: 0.00    Average packs/day: 1.5 packs/day for 30.0 years (45.0 ttl pk-yrs)    Types: Cigarettes    Start date: 05/02/1965    Quit date: 05/03/1995    Years since quitting: 28.7    Passive exposure: Past   Smokeless tobacco: Never  Vaping Use   Vaping status: Never Used  Substance and Sexual Activity   Alcohol use: Not Currently    Alcohol/week: 4.0 standard drinks of alcohol    Types: 4 Shots of liquor per week    Comment: none   Drug use: No   Sexual activity: Not Currently    Birth control/protection: Post-menopausal  Other Topics Concern   Not on file  Social History Narrative   Patient from home with husband.   Social Drivers of Corporate investment banker Strain: Low Risk  (12/06/2023)   Overall Financial Resource Strain (CARDIA)    Difficulty of Paying Living Expenses: Not hard at all  Food Insecurity: No Food Insecurity (12/06/2023)   Hunger Vital Sign    Worried About Running Out of Food in the Last Year: Never true    Ran Out of Food in the Last Year: Never true  Transportation Needs: No Transportation Needs (12/06/2023)   PRAPARE - Administrator, Civil Service (Medical): No    Lack of Transportation (Non-Medical): No  Physical Activity: Insufficiently Active (12/06/2023)   Exercise Vital Sign    Days of Exercise per Week: 3 days    Minutes of Exercise per Session: 30 min  Stress: No Stress Concern Present (12/06/2023)   [employer] of Occupational Health - Occupational Stress Questionnaire    Feeling of Stress: Only a little  Social Connections: Moderately Isolated (12/06/2023)   Social Connection and Isolation Panel    Frequency of Communication with Friends and Family: More than three times a week    Frequency of Social Gatherings with Friends and Family: Never    Attends Religious Services: Never     Database administrator or Organizations: No    Attends Banker Meetings: Never    Marital Status: Married  Catering manager Violence: Not At Risk (12/06/2023)   Humiliation, Afraid, Rape, and Kick questionnaire    Fear of Current or Ex-Partner: No    Emotionally Abused: No    Physically Abused: No    Sexually Abused: No    Family History  Problem Relation Age of Onset   Diabetes Mother    Heart attack Mother    Cancer Sister        cancer of eyes, spread  to lung, other places. died at 29   Cancer Sister 41       unknown primary, spread to bone, brain died in 53's   Cancer Paternal Aunt        unk type   Cancer Paternal Grandmother        type unk   Prostate cancer Neg Hx    Kidney cancer Neg Hx    Bladder Cancer Neg Hx    Breast cancer Neg Hx      Current Outpatient Medications:    albuterol  (VENTOLIN  HFA) 108 (90 Base) MCG/ACT inhaler, Inhale 2 puffs into the lungs every 6 (six) hours as needed for wheezing or shortness of breath., Disp: 51 each, Rfl: 2   apixaban  (ELIQUIS ) 5 MG TABS tablet, Take 1 tablet (5 mg total) by mouth 2 (two) times daily., Disp: 60 tablet, Rfl: 3   benzonatate  (TESSALON ) 100 MG capsule, Take 2 capsules (200 mg total) by mouth 3 (three) times daily as needed for cough., Disp: , Rfl:    buPROPion  (WELLBUTRIN ) 75 MG tablet, Take 75 mg by mouth 2 (two) times daily., Disp: , Rfl:    busPIRone  (BUSPAR ) 5 MG tablet, Take 1 tablet (5 mg total) by mouth 3 (three) times daily as needed., Disp: 270 tablet, Rfl: 2   cyclobenzaprine  (FLEXERIL ) 5 MG tablet, Take 5 mg by mouth 3 (three) times daily as needed for muscle spasms., Disp: , Rfl:    DULoxetine  (CYMBALTA ) 60 MG capsule, Take 1 capsule (60 mg total) by mouth daily., Disp: 90 capsule, Rfl: 1   fentaNYL  (DURAGESIC ) 12 MCG/HR, Place 1 patch onto the skin every 3 (three) days., Disp: 10 patch, Rfl: 0   fluticasone  (FLONASE ) 50 MCG/ACT nasal spray, Place 2 sprays into both nostrils daily., Disp: ,  Rfl:    Fluticasone -Umeclidin-Vilant (TRELEGY ELLIPTA ) 100-62.5-25 MCG/ACT AEPB, Inhale 1 Act into the lungs daily., Disp: 1 each, Rfl: 5   Fluticasone -Umeclidin-Vilant (TRELEGY ELLIPTA ) 100-62.5-25 MCG/ACT AEPB, Inhale 1 puff into the lungs daily., Disp: , Rfl:    gabapentin  (NEURONTIN ) 300 MG capsule, Take 300 mg by mouth 2 (two) times daily., Disp: , Rfl:    ipratropium-albuterol  (DUONEB) 0.5-2.5 (3) MG/3ML SOLN, Take 3 mLs by nebulization every 4 (four) hours as needed., Disp: , Rfl:    lactulose , encephalopathy, (CHRONULAC ) 10 GM/15ML SOLN, Place 30 mLs (20 g total) into feeding tube 2 (two) times daily., Disp: 946 mL, Rfl: 3   lidocaine  (LIDODERM ) 5 %, Place 1 patch onto the skin daily. Remove & Discard patch within 12 hours or as directed by MD, Disp: 30 patch, Rfl: 2   meloxicam  (MOBIC ) 7.5 MG tablet, Take 1 tablet (7.5 mg total) by mouth daily., Disp: 90 tablet, Rfl: 1   metoprolol  succinate (TOPROL -XL) 25 MG 24 hr tablet, TAKE 1 TABLET BY MOUTH DAILY, Disp: 30 tablet, Rfl: 4   montelukast  (SINGULAIR ) 10 MG tablet, TAKE 1 TABLET BY MOUTH AT BEDTIME, Disp: 90 tablet, Rfl: 0   Nutritional Supplements (NUTREN 1.5) LIQD, Give 1 carton 4 times a day (8am, noon, 4pm and 8pm). Flush with 60ml of water  before and after each feeding.  Give additional 1 1/2 cups of water  a day via tube or drink orally for adequate hydration., Disp: , Rfl:    omeprazole  (KONVOMEP ) 2 mg/mL SUSP oral suspension, Take 20 mLs (40 mg total) by mouth daily., Disp: 240 mL, Rfl: 1   ondansetron  (ZOFRAN ) 4 MG tablet, Take 1 tablet (4 mg total) by mouth every 6 (six)  hours as needed for nausea or vomiting., Disp: , Rfl:    oxyCODONE  (ROXICODONE ) 5 MG/5ML solution, Take 5 mLs (5 mg total) by mouth every 8 (eight) hours as needed for severe pain (pain score 7-10)., Disp: 200 mL, Rfl: 0   rOPINIRole  (REQUIP ) 0.5 MG tablet, Take 1 tablet (0.5 mg total) by mouth 3 (three) times daily., Disp: 90 tablet, Rfl: 1   rosuvastatin  (CRESTOR )  40 MG tablet, Place 1 tablet (40 mg total) into feeding tube daily., Disp: 90 tablet, Rfl: 0   senna (SENOKOT) 8.6 MG TABS tablet, Place 1 tablet (8.6 mg total) into feeding tube daily., Disp: , Rfl:    sucralfate  (CARAFATE ) 1 GM/10ML suspension, Place 10 mLs (1 g total) into feeding tube 4 (four) times daily., Disp: 1200 mL, Rfl: 2   tolterodine  (DETROL  LA) 4 MG 24 hr capsule, Take 4 mg by mouth daily., Disp: , Rfl:    triamcinolone  cream (KENALOG ) 0.1 %, Apply 1 Application topically 2 (two) times daily., Disp: , Rfl:    Water  For Irrigation, Sterile (FREE WATER ) SOLN, Place 60 mLs into feeding tube 5 (five) times daily., Disp: , Rfl:    bisacodyl  (DULCOLAX) 10 MG suppository, Place 1 suppository (10 mg total) rectally daily as needed for moderate constipation or severe constipation. (Patient not taking: Reported on 02/02/2024), Disp: , Rfl:    cefdinir  (OMNICEF ) 300 MG capsule, Take 1 capsule (300 mg total) by mouth 2 (two) times daily. (Patient not taking: Reported on 02/02/2024), Disp: 20 capsule, Rfl: 0   predniSONE  (DELTASONE ) 20 MG tablet, Take 1 tablet (20 mg total) by mouth daily with breakfast. 7 days (Patient not taking: Reported on 02/02/2024), Disp: 7 tablet, Rfl: 1   simethicone  (MYLICON) 80 MG chewable tablet, Place 1 tablet (80 mg total) into feeding tube 4 (four) times daily. (Patient not taking: Reported on 02/02/2024), Disp: , Rfl:  No current facility-administered medications for this visit.  Facility-Administered Medications Ordered in Other Visits:    cyanocobalamin  ((VITAMIN B-12)) injection 1,000 mcg, 1,000 mcg, Intramuscular, Q30 days, Melanee Annah BROCKS, MD, 1,000 mcg at 08/27/21 1537   cyanocobalamin  (VITAMIN B12) injection 1,000 mcg, 1,000 mcg, Intramuscular, Q30 days, Melanee Annah BROCKS, MD, 1,000 mcg at 03/16/22 1101  Physical exam:  Vitals:   02/02/24 0953  BP: 101/66  Pulse: 70  Resp: 17  Temp: (!) 96.7 F (35.9 C)  SpO2: 100%  Weight: 113 lb (51.3 kg)   Physical  Exam Constitutional:      Comments: She is frail and cachectic  Cardiovascular:     Rate and Rhythm: Normal rate and regular rhythm.     Heart sounds: Normal heart sounds.  Pulmonary:     Effort: Pulmonary effort is normal.     Breath sounds: Normal breath sounds.  Abdominal:     General: Bowel sounds are normal.     Palpations: Abdomen is soft.  Skin:    General: Skin is warm and dry.  Neurological:     Mental Status: She is alert and oriented to person, place, and time.      I have personally reviewed labs listed below:    Latest Ref Rng & Units 01/05/2024   10:20 AM  CMP  Glucose 70 - 99 mg/dL 74   BUN 8 - 23 mg/dL 25   Creatinine 9.55 - 1.00 mg/dL 9.30   Sodium 864 - 854 mmol/L 137   Potassium 3.5 - 5.1 mmol/L 4.3   Chloride 98 - 111 mmol/L 104  CO2 22 - 32 mmol/L 25   Calcium  8.9 - 10.3 mg/dL 9.0   Total Protein 6.5 - 8.1 g/dL 6.5   Total Bilirubin 0.0 - 1.2 mg/dL 0.5   Alkaline Phos 38 - 126 U/L 72   AST 15 - 41 U/L 48   ALT 0 - 44 U/L 48       Latest Ref Rng & Units 01/16/2024   11:17 AM  CBC  WBC 4.0 - 10.5 K/uL 5.8   Hemoglobin 12.0 - 15.0 g/dL 86.7   Hematocrit 63.9 - 46.0 % 41.4   Platelets 150 - 400 K/uL 182    I have personally reviewed Radiology images listed below: No images are attached to the encounter.  CT BONE TROCAR/NEEDLE BIOPSY SUPERFICIAL Result Date: 01/16/2024 INDICATION: 68 year old with history of breast and vocal cord carcinoma. Patient has multiple pulmonary nodules and bone lesions. Tissue diagnosis is needed. EXAM: CT-GUIDED BIOPSY OF LEFT SCAPULA LYTIC LESION TECHNIQUE: Multidetector CT imaging of the chest was performed following the standard protocol without IV contrast. RADIATION DOSE REDUCTION: This exam was performed according to the departmental dose-optimization program which includes automated exposure control, adjustment of the mA and/or kV according to patient size and/or use of iterative reconstruction technique. MEDICATIONS:  Moderate sedation ANESTHESIA/SEDATION: Moderate (conscious) sedation was employed during this procedure. A total of Versed  1.5 mg and Fentanyl  75 mcg was administered intravenously by the radiology nurse. Total intra-service moderate Sedation Time: 16 minutes. The patient's level of consciousness and vital signs were monitored continuously by radiology nursing throughout the procedure under my direct supervision. COMPLICATIONS: None immediate. PROCEDURE: Informed written consent was obtained from the patient after a thorough discussion of the procedural risks, benefits and alternatives. All questions were addressed. A timeout was performed prior to the initiation of the procedure. Patient was placed prone. CT images through the chest were obtained. Lytic lesion along the inferior aspect of the left scapula was identified and targeted. Left side of the back was prepped with chlorhexidine . Skin was anesthetized using 1% lidocaine . Using CT guidance, 17 gauge coaxial needle was directed into the lytic lesion. Two core biopsies were obtained with an 18 gauge core device. Specimens placed in formalin. Needle was removed. Bandage placed over the puncture site. FINDINGS: Lytic lesion along the inferior aspect of the left scapula. Coaxial needle placed within the soft tissue component of this lytic lesion. Two soft tissue core biopsies were obtained. IMPRESSION: CT-guided core biopsies of the lytic left scapular lesion. Electronically Signed   By: Juliene Balder M.D.   On: 01/16/2024 16:23     Assessment and plan- Patient is a 68 y.o. female with history of multiple malignancies in the past now presenting with stage IV non-small cell lung cancer with bone and lymph node metastases here to discuss further management  NGS testing shows evidence of eGFR point mutation in the P.G719S and P.V769M domain.  This is an uncommon eGFR mutation that falls outside the exon 19 deletion and L858R mutations.  They however are clinically  actionable.  Presently the strongest evidence is for second-generation eGFR TKI afatinib and multiple studies have shown meaningful responses with an overall response rate approaching 78% with PFS of 13 to 15 months especially when combined with another sensitizing mutation.  Afatinib irreversibly binds to eGFR and has a broader coverage of rare mutations as compared to first generation TKI's but also carries higher risk of side effects including dermatological toxicities such as skin rash, diarrhea low blood counts nausea vomiting  and abnormal LFTs.  An alternative would be a third-generation eGFR TKI such as osimertinib which has a better side effect profile and may be favorable especially in patients with brain metastases.  I will obtain MRI brain with and without contrast at this time.  I will start her on afatinib 30 mg daily which would be a lower dose as compared to the usual dose of 40 mg daily and see if she tolerates it better.  We will be working with insurance to get this approved for her.  I will tentatively see her back 3 weeks from now with repeat labs CBC with differential and CMP and see how she is tolerating afatinib.  Patient understands and agrees to proceed as planned.  Treatment will be given with a palliative intent.   I will discuss the utility of Zometa for bone metastases at my next visit Visit Diagnosis 1. Primary malignant neoplasm of right lung metastatic to other site [facility])   2. Goals of care, counseling/discussion      Dr. Annah Skene, MD, MPH [facility] [phone] 02/02/2024 11:41 AM

## 2024-02-02 NOTE — Telephone Encounter (Addendum)
 Oral Oncology Patient Advocate Encounter  Was successful in securing patient a $6000 grant from [facility] to provide copayment coverage for Gilotrif .  This will keep the out of pocket expense at $0.     [employer] ID: [id number]   The billing information is as follows and will be shared with appropriate pharmacy.    RxBin: [id number] PCN: [id number] Member ID: [id number] Group ID: [id number] Dates of Eligibility: 01/03/2024 through 01/01/2025  Fund:  Non-Small Cell Lung Cancer - Medicare Access  [place] ([name]) [name], CPhT  [facility], [name], [facility] - Oral Chemotherapy Patient Advocate Specialist III Phone: [phone]  Fax: [phone]

## 2024-02-02 NOTE — Telephone Encounter (Signed)
 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  CMP from 01/05/24 assessed, no relevant lab abnormalities but continue to monitor hepatic function. Prescription dose and frequency assessed. MD starting patient on a reduced dose to ensure tolerability.   Current medication list in Epic reviewed, one DDIs with afatnib identified: Omeprazole : PPIs may decrease therapeutic effects of Afatinib. No baseline dose adjustment needed.  Evaluated chart and one patient barriers to medication adherence identified. Per MD, patient has issues swallowing medication by month.   Alternative administration:  Place tablet into a glass containing 100 ml of noncarbonated drinking water  (no other liquids should be used). Drop tablet into the water  without crushing it. Stir occasionally for up to 15 minutes until the tablet is broken up into very fine particles. Drink the suspension immediately or administer via a gastric tube. Rinse the glass with another 100 mL of noncarbonated drinking water  and consume/administer the supplementary solution via the gastric tube again. Administer at least 1 hour before or 2 hours after food.   Prescription has been e-scribed to the [facility] for benefits analysis and approval.  Oral Oncology Clinic will continue to follow for insurance authorization, copayment issues, initial counseling and start date.   Brookie Wayment N. Adonus Uselman, PharmD, BCOP, CPP Hematology/Oncology Clinical Pharmacist [facility] Oral Chemotherapy Navigation Clinic [phone]  02/02/2024 11:47 AM

## 2024-02-02 NOTE — Telephone Encounter (Signed)
 Oral Oncology Patient Advocate Encounter  Prior Authorization for Gilotrif has been approved.    PA# [id number] Effective dates: 02/02/2024 through 07/31/2024  Patient must fill through [facility] specialty pharmacy.  Attaching test claim denial for reference:   Angelyn Osterberg ([name]) [name], CPhT  [facility], [name], [facility] - Oral Chemotherapy Patient Advocate Specialist III Phone: [phone]  Fax: [phone]

## 2024-02-02 NOTE — Telephone Encounter (Signed)
 Clinical Pharmacist Practitioner Encounter  Due to insurance restriction the medication could not be filled at [facility]. Prescription has been e-scribed to [facility].  Supportive information was been sent to [facility]. We will continue to follow medication access.   Called and notified patient, provided with new pharmacy number.  Temesha Queener N. Lakeya Mulka, PharmD, BCPS, [facility] Hematology/Oncology Clinical Pharmacist [facility] [phone]  02/02/2024 1:24 PM

## 2024-02-02 NOTE — Telephone Encounter (Signed)
 Oral Oncology Patient Advocate Encounter   New authorization   Received notification that prior authorization for Gilotrif is required.   PA submitted on CMM via Latent Key [id number] Status is pending     Denise Watts (Denise) Chet Watts, CPhT  [facility], [name], [facility] - Oral Chemotherapy Patient Advocate Specialist III Phone: [phone]  Fax: [phone]

## 2024-02-02 NOTE — Progress Notes (Signed)
 Patient here for oncology follow-up appointment, expresses no complaints or concerns at this time.

## 2024-02-06 ENCOUNTER — Ambulatory Visit
Admission: RE | Admit: 2024-02-06 | Discharge: 2024-02-06 | Disposition: A | Discharge status: Home / Self Care | Source: Ambulatory Visit | Attending: Oncology | Admitting: Oncology

## 2024-02-06 DIAGNOSIS — C3491 Malignant neoplasm of unspecified part of right bronchus or lung: Secondary | ICD-10-CM | POA: Diagnosis not present

## 2024-02-06 DIAGNOSIS — I6381 Other cerebral infarction due to occlusion or stenosis of small artery: Secondary | ICD-10-CM | POA: Diagnosis not present

## 2024-02-06 MED ORDER — HEPARIN SOD (PORK) LOCK FLUSH 100 UNIT/ML IV SOLN
INTRAVENOUS | Status: AC
  Filled 2024-02-06: qty 5

## 2024-02-06 MED ORDER — GADOBUTROL 1 MMOL/ML IV SOLN
5.0000 mL | Freq: Once | INTRAVENOUS | Status: AC | PRN
  Administered 2024-02-06: 5 mL via INTRAVENOUS

## 2024-02-06 MED ORDER — HEPARIN SOD (PORK) LOCK FLUSH 100 UNIT/ML IV SOLN
500.0000 [IU] | Freq: Once | INTRAVENOUS | Status: AC
Start: 2024-02-06 — End: 2024-02-06
  Administered 2024-02-06: 500 [IU] via INTRAVENOUS

## 2024-02-06 MED ORDER — AFATINIB DIMALEATE 30 MG PO TABS: 30.0000 mg | ORAL_TABLET | Freq: Every day | ORAL | 1 refills | Status: DC

## 2024-02-06 NOTE — Telephone Encounter (Signed)
 Oral Oncology Patient Advocate Encounter  Received notification via fax that [facility] is not able to gill Gilotrif.  Prescription has been sent to [facility].  Cathey [place]enburg ([name]) [name], CPhT  [facility], [name], [facility] - Oral Chemotherapy Patient Advocate Specialist III Phone: [phone]  Fax: [phone]

## 2024-02-06 NOTE — Telephone Encounter (Signed)
 Received fax notification that [facility] did not have access to Gilotrif. [facility] started the are should be sent to [facility].   Rx redirected to [facility].

## 2024-02-08 ENCOUNTER — Telehealth: Payer: Self-pay

## 2024-02-08 NOTE — Telephone Encounter (Signed)
 Oral Chemotherapy Pharmacist Encounter  I spoke with patient for overview of: Gilotrif (afatinib) for the treatment of metastatic, EGFR mutation-positive (G719S and V769M) NSCLC, planned duration until disease progression or unacceptable toxicity.   Treatment goal: Palliative  Counseled patient on administration, dosing, side effects, monitoring, drug-food interactions, safe handling, storage, and disposal.  Patient will take Gilotrif 30 tablets, 1 tablet by mouth once daily, on an empty stomach, 1 hour before or 2 hours after a meal.  Patient stated she is currently administering medications via tube - the following instructions were given to her verbally over the phone and also included in writing and mailed:  Place tablet into a glass containing 100 ml of noncarbonated drinking water  (no other liquids should be used). Drop tablet into the water  without crushing it. Stir occasionally for up to 15 minutes until the tablet is broken up into very fine particles. Drink the suspension immediately or administer via a gastric tube. Rinse the glass with another 100 mL of noncarbonated drinking water  and consume/administer the supplementary solution via the gastric tube again. Administer at least 1 hour before or 2 hours after food.    Gilotrif start date: patient will start once received from [facility] - she knows to call pharmacy to set up shipment to her home  Adverse effects include but are not limited to: rash, diarrhea, nausea, vomiting, decreased appetitie, fatigue, mouth sores Diarrhea: Patient will obtain Imodium (loperamide) to have on hand if they experience diarrhea. Patient knows to alert the office of 4 or more loose stools above baseline. Dry Skin/Rash: Educated patient to keep skin moisturized while on Gilotrif with non-scented lotion twice daily. Patient knows to alert office for further assistance if they develop a rash.  Nail changes: We discussed that if patient  develops nail changes that a 1:1 solution of white vinegar and water  soak for 15 minutes a day can help, but patient should alert the office if this occurs.   Reviewed with patient importance of keeping a medication schedule and plan for any missed doses. No barriers to medication adherence identified.  Medication reconciliation performed and medication/allergy list updated.  Distress thermometer flowsheet: Distress thermometer completed during telephone call and reviewed with patient. Due to score, social work referral has not been sent.  Communication and Learning Assessment Primary learner: Patient Barriers to learning: No barriers Preferred language: English Learning preferences: Listening Reading  All questions answered.  Ms. Flippen voiced understanding and appreciation.   Medication education handout and instructions on how to dissolve medication for administration placed in mail for patient. Patient knows to call the office with questions or concerns. Oral Chemotherapy Clinic phone number provided to patient.   Asberry Macintosh, PharmD, BCPS, BCOP Hematology/Oncology Clinical Pharmacist [phone] 02/08/2024 10:10 AM

## 2024-02-08 NOTE — Telephone Encounter (Signed)
 Dr. Melanee asked me to try to get in touch with patient about starting afatinib; she May need to get set up in system. If I get in touch with patient per [place] She can call me at [phone] and I will get her connected with the covering pharmacist so she can do her education on her med. Spoke to [place], informed of above. Patient agreed she would reach out to [place] now.

## 2024-02-09 ENCOUNTER — Other Ambulatory Visit: Payer: Self-pay | Admitting: *Deleted

## 2024-02-09 MED ORDER — OXYCODONE HCL 5 MG/5ML PO SOLN: 5.0000 mg | Freq: Three times a day (TID) | ORAL | 0 refills | Status: DC | PRN

## 2024-02-09 NOTE — Telephone Encounter (Signed)
 Oral Oncology Patient Advocate Encounter  Per [facility], patient's medication is scheduled to be delivered 02/12/2024.  Denise Watts ([name]) [name], CPhT  [facility], [name], [facility] - Oral Chemotherapy Patient Advocate Specialist III Phone: [phone]  Fax: [phone]

## 2024-02-26 ENCOUNTER — Encounter: Payer: Self-pay | Admitting: Oncology

## 2024-02-26 ENCOUNTER — Other Ambulatory Visit: Payer: Self-pay

## 2024-02-26 ENCOUNTER — Inpatient Hospital Stay: Admitting: Oncology

## 2024-02-26 ENCOUNTER — Inpatient Hospital Stay

## 2024-02-26 ENCOUNTER — Other Ambulatory Visit: Payer: Self-pay | Admitting: Family Medicine

## 2024-02-26 ENCOUNTER — Inpatient Hospital Stay (HOSPITAL_BASED_OUTPATIENT_CLINIC_OR_DEPARTMENT_OTHER): Admitting: Hospice and Palliative Medicine

## 2024-02-26 ENCOUNTER — Telehealth: Payer: Self-pay

## 2024-02-26 VITALS — BP 116/84 | HR 65 | Resp 18

## 2024-02-26 VITALS — BP 113/59 | HR 65 | Temp 96.3°F | Resp 20 | Ht 61.0 in | Wt 110.3 lb

## 2024-02-26 DIAGNOSIS — R21 Rash and other nonspecific skin eruption: Secondary | ICD-10-CM

## 2024-02-26 DIAGNOSIS — Z853 Personal history of malignant neoplasm of breast: Secondary | ICD-10-CM | POA: Diagnosis not present

## 2024-02-26 DIAGNOSIS — E538 Deficiency of other specified B group vitamins: Secondary | ICD-10-CM

## 2024-02-26 DIAGNOSIS — C3491 Malignant neoplasm of unspecified part of right bronchus or lung: Secondary | ICD-10-CM

## 2024-02-26 DIAGNOSIS — Z79899 Other long term (current) drug therapy: Secondary | ICD-10-CM

## 2024-02-26 DIAGNOSIS — C7951 Secondary malignant neoplasm of bone: Secondary | ICD-10-CM

## 2024-02-26 DIAGNOSIS — C7801 Secondary malignant neoplasm of right lung: Secondary | ICD-10-CM

## 2024-02-26 DIAGNOSIS — Z23 Encounter for immunization: Secondary | ICD-10-CM

## 2024-02-26 DIAGNOSIS — Z5181 Encounter for therapeutic drug level monitoring: Secondary | ICD-10-CM

## 2024-02-26 DIAGNOSIS — C7802 Secondary malignant neoplasm of left lung: Secondary | ICD-10-CM | POA: Diagnosis not present

## 2024-02-26 DIAGNOSIS — I25118 Atherosclerotic heart disease of native coronary artery with other forms of angina pectoris: Secondary | ICD-10-CM

## 2024-02-26 DIAGNOSIS — Z931 Gastrostomy status: Secondary | ICD-10-CM

## 2024-02-26 DIAGNOSIS — Z87891 Personal history of nicotine dependence: Secondary | ICD-10-CM

## 2024-02-26 LAB — CBC WITH DIFFERENTIAL (CANCER CENTER ONLY)
Abs Immature Granulocytes: 0.02 K/uL (ref 0.00–0.07)
Basophils Absolute: 0 K/uL (ref 0.0–0.1)
Basophils Relative: 1 %
Eosinophils Absolute: 0.2 K/uL (ref 0.0–0.5)
Eosinophils Relative: 5 %
HCT: 39.6 % (ref 36.0–46.0)
Hemoglobin: 12.7 g/dL (ref 12.0–15.0)
Immature Granulocytes: 0 %
Lymphocytes Relative: 16 %
Lymphs Abs: 0.8 K/uL (ref 0.7–4.0)
MCH: 31.3 pg (ref 26.0–34.0)
MCHC: 32.1 g/dL (ref 30.0–36.0)
MCV: 97.5 fL (ref 80.0–100.0)
Monocytes Absolute: 0.5 K/uL (ref 0.1–1.0)
Monocytes Relative: 9 %
Neutro Abs: 3.4 K/uL (ref 1.7–7.7)
Neutrophils Relative %: 69 %
Platelet Count: 172 K/uL (ref 150–400)
RBC: 4.06 MIL/uL (ref 3.87–5.11)
RDW: 12.5 % (ref 11.5–15.5)
WBC Count: 4.9 K/uL (ref 4.0–10.5)
nRBC: 0 % (ref 0.0–0.2)

## 2024-02-26 LAB — CMP (CANCER CENTER ONLY)
ALT: 87 U/L — ABNORMAL HIGH (ref 0–44)
AST: 76 U/L — ABNORMAL HIGH (ref 15–41)
Albumin: 3.5 g/dL (ref 3.5–5.0)
Alkaline Phosphatase: 75 U/L (ref 38–126)
Anion gap: 7 (ref 5–15)
BUN: 22 mg/dL (ref 8–23)
CO2: 28 mmol/L (ref 22–32)
Calcium: 8.9 mg/dL (ref 8.9–10.3)
Chloride: 102 mmol/L (ref 98–111)
Creatinine: 0.75 mg/dL (ref 0.44–1.00)
GFR, Estimated: >60 mL/min (ref >60)
Glucose, Bld: 107 mg/dL — ABNORMAL HIGH (ref 70–99)
Potassium: 4 mmol/L (ref 3.5–5.1)
Sodium: 137 mmol/L (ref 135–145)
Total Bilirubin: 0.6 mg/dL (ref 0.0–1.2)
Total Protein: 6.4 g/dL — ABNORMAL LOW (ref 6.5–8.1)

## 2024-02-26 MED ORDER — CYANOCOBALAMIN 1000 MCG/ML IJ SOLN
1000.0000 ug | Freq: Once | INTRAMUSCULAR | Status: AC
  Administered 2024-02-26: 1000 ug via INTRAMUSCULAR
  Filled 2024-02-26: qty 1

## 2024-02-26 MED ORDER — ZOLEDRONIC ACID 4 MG/5ML IV CONC
3.5000 mg | INTRAVENOUS | Status: DC
  Administered 2024-02-26: 3.5 mg via INTRAVENOUS
  Filled 2024-02-26: qty 4.38

## 2024-02-26 MED ORDER — ZOLEDRONIC ACID 4 MG/100ML IV SOLN: 4.0000 mg | INTRAVENOUS | Status: DC

## 2024-02-26 MED ORDER — SODIUM CHLORIDE 0.9 % IV SOLN
INTRAVENOUS | Status: DC
  Filled 2024-02-26: qty 250

## 2024-02-26 NOTE — Progress Notes (Signed)
 Hematology/Oncology Consult note [facility]  [phone] [phone]  Patient Care Team: [name], DO as PCP - General (Family Medicine) Darliss Rogue, MD as PCP - Cardiology (Cardiology) Melanee Annah BROCKS, MD as Consulting Physician (Oncology) Jordis Laneta FALCON, MD as Consulting Physician (General Surgery) Dasher, Alm LABOR, MD (Dermatology) Scheeler, Donnice PARAS, PA-C as Physician Assistant (Plastic Surgery) Dorothyann Drivers, MD as Attending Physician (Emergency Medicine) Ferrel, Lionel Sotero RIGGERS (Neurology) French Mering, MD as Referring Physician (Internal Medicine) Shellia Oh, MD as Consulting Physician (Pulmonary Disease) Pa, [facility] (Optometry) Verdene Gills, RN as Oncology Nurse Navigator Lenn Aran, MD as Consulting Physician (Radiation Oncology) Moises Reusing, RN as [facility], [place], LCSW as [facility] [place], [place], [place]   Name of the patient: Denise Watts  [id number]  [birth year]   Date of visit: 02/26/24  Diagnosis-metastatic non-small cell lung cancer  Chief complaint/ Reason for visit-routine follow-up of lung cancer eGFR positive P.G719S and P.V769M mutation presently on afatinib  Heme/Onc history:  Oncology History Overview Note  1. Carcinoma of breast, T1, N0, M0 tumor diagnosis.  In January of 1997 2. Carcinoma of the vocal cord T1, N0, M0 tumor.  Status postradiation therapy 3. Abnormal CT scan of the chest (December, 2015) 4. Repeat CT scan of chest shows stable nodule (July, 2016) 5. Neutropenia with normal hemoglobin and normal platelet count (July, 2016)   Cancer of vocal cord [facility]) (Resolved)  11/10/2014 Initial Diagnosis   Cancer of vocal cord   Malignant neoplasm of lower-outer quadrant of right breast of female, estrogen receptor negative (HCC)  11/24/2017 Initial Diagnosis   Malignant neoplasm of lower-outer quadrant of right breast  of female, estrogen receptor negative (HCC)   01/12/2018 Cancer Staging   Staging form: Breast, AJCC 8th Edition - Clinical stage from 01/12/2018: Stage IB (cT1a, cN0, cM0, G3, ER+, PR-, HER2-) - Signed by Melanee Annah BROCKS, MD on 01/12/2018   01/30/2018 - 04/26/2018 Chemotherapy   The patient had dexamethasone  (DECADRON ) 4 MG tablet, 8 mg, Oral, 2 times daily, 1 of 1 cycle, Start date: 01/12/2018, End date: 08/03/2018 palonosetron  (ALOXI ) injection 0.25 mg, 0.25 mg, Intravenous,  Once, 4 of 4 cycles Administration: 0.25 mg (01/30/2018), 0.25 mg (02/27/2018), 0.25 mg (03/28/2018), 0.25 mg (04/26/2018) pegfilgrastim  (NEULASTA  ONPRO KIT) injection 6 mg, 6 mg, Subcutaneous, Once, 4 of 4 cycles Administration: 6 mg (01/30/2018), 6 mg (02/27/2018), 6 mg (03/28/2018), 6 mg (04/26/2018) cyclophosphamide  (CYTOXAN ) 1,000 mg in sodium chloride  0.9 % 250 mL chemo infusion, 600 mg/m2 = 1,000 mg, Intravenous,  Once, 4 of 4 cycles Administration: 1,000 mg (01/30/2018), 1,000 mg (02/27/2018), 1,000 mg (03/28/2018), 1,000 mg (04/26/2018) DOCEtaxel  (TAXOTERE ) 130 mg in sodium chloride  0.9 % 250 mL chemo infusion, 75 mg/m2 = 130 mg, Intravenous,  Once, 4 of 4 cycles Dose modification: 60 mg/m2 (original dose 75 mg/m2, Cycle 2, Reason: Dose not tolerated) Administration: 130 mg (01/30/2018), 100 mg (02/27/2018), 100 mg (03/28/2018), 100 mg (04/26/2018)  for chemotherapy treatment.    Non-small cell lung cancer (HCC)  03/09/2023 Initial Diagnosis   Non-small cell lung cancer (HCC)   03/20/2023 -  Chemotherapy   Patient is on Treatment Plan : LUNG Carboplatin  + Paclitaxel  q7d     Primary malignant neoplasm of right lung metastatic to other site [facility])  01/22/2024 Initial Diagnosis   Primary malignant neoplasm of right lung metastatic to other site [facility])   01/22/2024 Cancer Staging   Staging form: Lung, AJCC  V9 - Clinical stage from 01/22/2024: rcT4, rcN3, rpM1 - Signed by Melanee Annah BROCKS, MD on 01/22/2024 Stage prefix:  Recurrence    NGS testing from scapular biopsy showed evidence of eGFR point mutation P.G719S and P.V769M.  She was started on afatinib on 02/12/2024    Interval history- Discussed the use of AI scribe software for clinical note transcription with the patient, who gave verbal consent to proceed.  History of Present Illness   Denise Watts is a 68 year old female with metastatic squamous cell lung cancer who presents for follow-up on her treatment regimen.  She has metastatic squamous cell lung cancer with involvement in both lungs and bones. Previously, she received chemotherapy and radiation but was unable to tolerate the low dose of chemotherapy, leading to its premature discontinuation.  She has an uncommon EGFR mutation and was started on afatinib, which was delivered to her home on February 12, 2024. She is currently taking 30 mg of afatinib daily and feels better since starting the medication. She has experienced a mild skin rash and dry skin but no diarrhea.  She has a feeding tube that is causing significant discomfort, including drainage, bleeding, and pain. The tube is currently blocked, and she has not been using it. Despite this, she has an improved appetite and better oral intake.  ECOG PS- 2 Pain scale- 3 Opioid associated constipation- no  Review of systems- Review of Systems  Constitutional:  Positive for malaise/fatigue.  Musculoskeletal:  Positive for back pain and myalgias.      Allergies  Allergen Reactions   Sulfa  Antibiotics Other (See Comments)    Mouth sore and raw     Past Medical History:  Diagnosis Date   Acute pulmonary embolism (HCC) 05/10/2023   Adenomatous colon polyp    Adrenal carcinoma, right (HCC) 12/28/2016   a.) high grade carcinoma with extensive necrosis --> s/p adrenalectomy   Anxiety    Aortic atherosclerosis    Arthritis of right knee    Atrial fibrillation and flutter (HCC)    a.) CHA2DS2-VASc = 3 (age, sex, vascular disease  history) as of 02/13/2023; b.) cardiac rate/rhythm maintained intrinsically without pharmacological intervention; no chronic OAC (does take low dose ASA)   B12 deficiency    Breast cancer (HCC) 1997   a.) stage IB (cT1a, cN0, cM0, G3, ER+, PR-, HER2-); s/p lumpectomy + XRT   COPD (chronic obstructive pulmonary disease) (HCC)    Coronary artery disease    a.) cCTA 12/09/2021: Ca2+ = 135 (80th %'ile for age/sex/race match contol) --> 25-49% pLAD and RCA distributions   Depression    Diastolic dysfunction    a.) TTE 12/11/2020, no RWMAs, norm RVSF, G1DD; b.) TTE 12/14/2021: EF 55-60, no RWMAs, norm RVSF, G1DD; c.) TTE 01/27/2023: EF 50-55%, no RWMAs, G1DD, mild RAE, mild MR, mod TR   Dry cough 03/02/2017   Dyspnea    Full dentures    GERD (gastroesophageal reflux disease)    History of hiatal hernia    HLD (hyperlipidemia)    Invasive carcinoma of breast (HCC) 11/15/2017   a.) path (+) for G3, ER -, PR -, HER2/neu - ; s/p lumpectomy 01/17/2018 + 4 cycles TC systemic chemotherapy + XRT   Long term current use of aspirin     Personal history of chemotherapy    Personal history of radiation therapy    Pneumothorax of left lung after biopsy 01/25/2023   a.) hypoxic in PACU requiring re-intubation --> transferred to ICU --> chest tube  placed by PCCM   RLS (restless legs syndrome)    a.) on ropinirole    Skin cancer    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   a.) Stage IB (cT1a, cN0, cM0, G3, ER+, PR-, HER2-)     Past Surgical History:  Procedure Laterality Date   BIOPSY  01/05/2023   Procedure: BIOPSY;  Surgeon: Unk Corinn Skiff, MD;  Location: [facility];  Service: Gastroenterology;;   BREAST BIOPSY Right 1997   positive   BREAST BIOPSY Right 11/15/2017   INVASIVE MAMMARY CARCINOMA triple negative   BREAST BIOPSY Right 07/20/2020   us  bx, Q marker, neg   BREAST LUMPECTOMY Right 1997   2019 also   BREAST LUMPECTOMY WITH SENTINEL LYMPH NODE BIOPSY Right 12/08/2017    Procedure: BREAST LUMPECTOMY WITH SENTINEL LYMPH NODE BX;  Surgeon: Dessa Reyes ORN, MD;  Location: [facility];  Service: General;  Laterality: Right;   BREAST REDUCTION WITH MASTOPEXY Left 06/10/2019   Procedure: Left breast mastopexy reduction for symmetry;  Surgeon: Lowery Estefana RAMAN, DO;  Location: [facility];  Service: Plastics;  Laterality: Left;  total 2.5 hours   CATARACT EXTRACTION W/PHACO Right 02/07/2022   Procedure: CATARACT EXTRACTION PHACO AND INTRAOCULAR LENS PLACEMENT (IOC) RIGHT DIABETIC;  Surgeon: Myrna Adine Anes, MD;  Location: [facility];  Service: Ophthalmology;  Laterality: Right;  3.60 00:34.7   CATARACT EXTRACTION W/PHACO Left 02/28/2022   Procedure: CATARACT EXTRACTION PHACO AND INTRAOCULAR LENS PLACEMENT (IOC) LEFT DIABETIC 4.11 00:26.1;  Surgeon: Myrna Adine Anes, MD;  Location: [facility];  Service: Ophthalmology;  Laterality: Left;   COLONOSCOPY  2015   COLONOSCOPY N/A 01/05/2023   Procedure: COLONOSCOPY;  Surgeon: Unk Corinn Skiff, MD;  Location: [facility];  Service: Gastroenterology;  Laterality: N/A;   COLONOSCOPY WITH PROPOFOL  N/A 10/29/2019   Procedure: COLONOSCOPY WITH PROPOFOL ;  Surgeon: Jinny Carmine, MD;  Location: [facility];  Service: Endoscopy;  Laterality: N/A;   ESOPHAGOGASTRODUODENOSCOPY (EGD) WITH PROPOFOL  N/A 05/24/2019   Procedure: ESOPHAGOGASTRODUODENOSCOPY (EGD) WITH PROPOFOL ;  Surgeon: Jinny Carmine, MD;  Location: [facility];  Service: Endoscopy;  Laterality: N/A;   ESOPHAGOGASTRODUODENOSCOPY (EGD) WITH PROPOFOL  N/A 09/06/2022   Procedure: ESOPHAGOGASTRODUODENOSCOPY (EGD) WITH PROPOFOL ;  Surgeon: Jinny Carmine, MD;  Location: [facility];  Service: Endoscopy;  Laterality: N/A;   ESOPHAGOGASTRODUODENOSCOPY (EGD) WITH PROPOFOL   01/05/2023   Procedure: ESOPHAGOGASTRODUODENOSCOPY (EGD) WITH PROPOFOL ;  Surgeon: Unk Corinn Skiff, MD;  Location: [facility];  Service: Gastroenterology;;   ESOPHAGUS  SURGERY     GASTROSTOMY N/A 05/25/2023   Procedure: OPEN INSERTION OF GASTROSTOMY TUBE;  Surgeon: Jordis Laneta FALCON, MD;  Location: [facility];  Service: General;  Laterality: N/A;   IR IMAGING GUIDED PORT INSERTION  02/07/2023   IR REPLC GASTRO/COLONIC TUBE PERCUT W/FLUORO  09/12/2023   KNEE SURGERY Right    LIPOSUCTION WITH LIPOFILLING Right 06/10/2019   Procedure: right breast scar and capsule release with fat grafting;  Surgeon: Lowery Estefana RAMAN, DO;  Location:  SURGERY CENTER;  Service: Plastics;  Laterality: Right;   POLYPECTOMY  01/05/2023   Procedure: POLYPECTOMY;  Surgeon: Unk Corinn Skiff, MD;  Location: [facility] ENDOSCOPY;  Service: Gastroenterology;;   [facility] REMOVAL     PORTACATH PLACEMENT Right 01/17/2018   Procedure: INSERTION PORT-A-CATH- RIGHT;  Surgeon: Dessa Reyes ORN, MD;  Location: [facility];  Service: General;  Laterality: Right;   PULMONARY THROMBECTOMY Bilateral 05/12/2023   Procedure: PULMONARY THROMBECTOMY;  Surgeon: Marea Selinda RAMAN, MD;  Location: [facility];  Service: Cardiovascular;  Laterality: Bilateral;   ROBOTIC ADRENALECTOMY Right 12/28/2016   Procedure: ROBOTIC ADRENALECTOMY;  Surgeon: Penne Knee, MD;  Location: [facility];  Service: Urology;  Laterality: Right;   SKIN CANCER EXCISION     THROAT SURGERY  1998   throat cancer    TUBAL LIGATION     VENTRAL HERNIA REPAIR N/A 03/03/2017   Procedure: HERNIA REPAIR VENTRAL ADULT;  Surgeon: Shelva Dunnings, MD;  Location: [facility];  Service: General;  Laterality: N/A;   VIDEO BRONCHOSCOPY WITH ENDOBRONCHIAL ULTRASOUND N/A 01/25/2023   Procedure: VIDEO BRONCHOSCOPY WITH ENDOBRONCHIAL ULTRASOUND;  Surgeon: Parris Manna, MD;  Location: [facility];  Service: Thoracic;  Laterality: N/A;   VIDEO BRONCHOSCOPY WITH ENDOBRONCHIAL ULTRASOUND N/A 02/17/2023   Procedure: VIDEO BRONCHOSCOPY WITH ENDOBRONCHIAL ULTRASOUND;  Surgeon: Parris Manna, MD;  Location: [facility];  Service: Thoracic;  Laterality:  N/A;    Social History   Socioeconomic History   Marital status: Married    Spouse name: Bossi,Joseph (Spouse) [phone] ([place])   Number of children: 4   Years of education: Not on file   Highest education level: 10th grade  Occupational History   Occupation: retired  Tobacco Use   Smoking status: Former    Current packs/day: 0.00    Average packs/day: 1.5 packs/day for 30.0 years (45.0 ttl pk-yrs)    Types: Cigarettes    Start date: 05/02/1965    Quit date: 05/03/1995    Years since quitting: 28.8    Passive exposure: Past   Smokeless tobacco: Never  Vaping Use   Vaping status: Never Used  Substance and Sexual Activity   Alcohol use: Not Currently    Alcohol/week: 4.0 standard drinks of alcohol    Types: 4 Shots of liquor per week    Comment: none   Drug use: No   Sexual activity: Not Currently    Birth control/protection: Post-menopausal  Other Topics Concern   Not on file  Social History Narrative   Patient from home with husband.   Social Drivers of Corporate Investment Banker Strain: Low Risk  (12/06/2023)   Overall Financial Resource Strain (CARDIA)    Difficulty of Paying Living Expenses: Not hard at all  Food Insecurity: No Food Insecurity (12/06/2023)   Hunger Vital Sign    Worried About Running Out of Food in the Last Year: Never true    Ran Out of Food in the Last Year: Never true  Transportation Needs: No Transportation Needs (12/06/2023)   PRAPARE - Administrator, Civil Service (Medical): No    Lack of Transportation (Non-Medical): No  Physical Activity: Insufficiently Active (12/06/2023)   Exercise Vital Sign    Days of Exercise per Week: 3 days    Minutes of Exercise per Session: 30 min  Stress: No Stress Concern Present (12/06/2023)   [employer] of Occupational Health - Occupational Stress Questionnaire    Feeling of Stress: Only a little  Social Connections: Moderately Isolated (12/06/2023)   Social Connection and Isolation Panel     Frequency of Communication with Friends and Family: More than three times a week    Frequency of Social Gatherings with Friends and Family: Never    Attends Religious Services: Never    Database Administrator or Organizations: No    Attends Banker Meetings: Never    Marital Status: Married  Catering Manager Violence: Not At Risk (12/06/2023)   Humiliation, Afraid, Rape, and Kick questionnaire    Fear of Current or  Ex-Partner: No    Emotionally Abused: No    Physically Abused: No    Sexually Abused: No    Family History  Problem Relation Age of Onset   Diabetes Mother    Heart attack Mother    Cancer Sister        cancer of eyes, spread to lung, other places. died at 67   Cancer Sister 77       unknown primary, spread to bone, brain died in 28's   Cancer Paternal Aunt        unk type   Cancer Paternal Grandmother        type unk   Prostate cancer Neg Hx    Kidney cancer Neg Hx    Bladder Cancer Neg Hx    Breast cancer Neg Hx      Current Outpatient Medications:    afatinib dimaleate (GILOTRIF) 30 MG tablet, Take 1 tablet (30 mg total) by mouth daily. Take on an empty stomach 1hr before or 2hrs after meals., Disp: 30 tablet, Rfl: 1   albuterol  (VENTOLIN  HFA) 108 (90 Base) MCG/ACT inhaler, Inhale 2 puffs into the lungs every 6 (six) hours as needed for wheezing or shortness of breath., Disp: 51 each, Rfl: 2   apixaban  (ELIQUIS ) 5 MG TABS tablet, Take 1 tablet (5 mg total) by mouth 2 (two) times daily., Disp: 60 tablet, Rfl: 3   benzonatate  (TESSALON ) 100 MG capsule, Take 2 capsules (200 mg total) by mouth 3 (three) times daily as needed for cough., Disp: , Rfl:    buPROPion  (WELLBUTRIN ) 75 MG tablet, Take 75 mg by mouth 2 (two) times daily., Disp: , Rfl:    busPIRone  (BUSPAR ) 5 MG tablet, Take 1 tablet (5 mg total) by mouth 3 (three) times daily as needed., Disp: 270 tablet, Rfl: 2   cyclobenzaprine  (FLEXERIL ) 5 MG tablet, Take 5 mg by mouth 3 (three) times daily as  needed for muscle spasms., Disp: , Rfl:    DULoxetine  (CYMBALTA ) 60 MG capsule, Take 1 capsule (60 mg total) by mouth daily., Disp: 90 capsule, Rfl: 1   fentaNYL  (DURAGESIC ) 12 MCG/HR, Place 1 patch onto the skin every 3 (three) days., Disp: 10 patch, Rfl: 0   fluticasone  (FLONASE ) 50 MCG/ACT nasal spray, Place 2 sprays into both nostrils daily., Disp: , Rfl:    Fluticasone -Umeclidin-Vilant (TRELEGY ELLIPTA ) 100-62.5-25 MCG/ACT AEPB, Inhale 1 Act into the lungs daily., Disp: 1 each, Rfl: 5   Fluticasone -Umeclidin-Vilant (TRELEGY ELLIPTA ) 100-62.5-25 MCG/ACT AEPB, Inhale 1 puff into the lungs daily., Disp: , Rfl:    gabapentin  (NEURONTIN ) 300 MG capsule, Take 300 mg by mouth 2 (two) times daily., Disp: , Rfl:    ipratropium-albuterol  (DUONEB) 0.5-2.5 (3) MG/3ML SOLN, Take 3 mLs by nebulization every 4 (four) hours as needed., Disp: , Rfl:    lactulose , encephalopathy, (CHRONULAC ) 10 GM/15ML SOLN, Place 30 mLs (20 g total) into feeding tube 2 (two) times daily., Disp: 946 mL, Rfl: 3   lidocaine  (LIDODERM ) 5 %, Place 1 patch onto the skin daily. Remove & Discard patch within 12 hours or as directed by MD, Disp: 30 patch, Rfl: 2   meloxicam  (MOBIC ) 7.5 MG tablet, Take 1 tablet (7.5 mg total) by mouth daily., Disp: 90 tablet, Rfl: 1   metoprolol  succinate (TOPROL -XL) 25 MG 24 hr tablet, TAKE 1 TABLET BY MOUTH DAILY, Disp: 30 tablet, Rfl: 4   montelukast  (SINGULAIR ) 10 MG tablet, TAKE 1 TABLET BY MOUTH AT BEDTIME, Disp: 90 tablet, Rfl: 0  Nutritional Supplements (NUTREN 1.5) LIQD, Give 1 carton 4 times a day (8am, noon, 4pm and 8pm). Flush with 60ml of water  before and after each feeding.  Give additional 1 1/2 cups of water  a day via tube or drink orally for adequate hydration., Disp: , Rfl:    omeprazole  (KONVOMEP ) 2 mg/mL SUSP oral suspension, Take 20 mLs (40 mg total) by mouth daily., Disp: 240 mL, Rfl: 1   ondansetron  (ZOFRAN ) 4 MG tablet, Take 1 tablet (4 mg total) by mouth every 6 (six) hours as  needed for nausea or vomiting., Disp: , Rfl:    oxyCODONE  (ROXICODONE ) 5 MG/5ML solution, Take 5 mLs (5 mg total) by mouth every 8 (eight) hours as needed for severe pain (pain score 7-10)., Disp: 200 mL, Rfl: 0   rOPINIRole  (REQUIP ) 0.5 MG tablet, Take 1 tablet (0.5 mg total) by mouth 3 (three) times daily., Disp: 90 tablet, Rfl: 1   rosuvastatin  (CRESTOR ) 40 MG tablet, Place 1 tablet (40 mg total) into feeding tube daily., Disp: 90 tablet, Rfl: 0   senna (SENOKOT) 8.6 MG TABS tablet, Place 1 tablet (8.6 mg total) into feeding tube daily., Disp: , Rfl:    sucralfate  (CARAFATE ) 1 GM/10ML suspension, Place 10 mLs (1 g total) into feeding tube 4 (four) times daily., Disp: 1200 mL, Rfl: 2   tolterodine  (DETROL  LA) 4 MG 24 hr capsule, Take 4 mg by mouth daily., Disp: , Rfl:    triamcinolone  cream (KENALOG ) 0.1 %, Apply 1 Application topically 2 (two) times daily., Disp: , Rfl:    Water  For Irrigation, Sterile (FREE WATER ) SOLN, Place 60 mLs into feeding tube 5 (five) times daily., Disp: , Rfl:    bisacodyl  (DULCOLAX) 10 MG suppository, Place 1 suppository (10 mg total) rectally daily as needed for moderate constipation or severe constipation. (Patient not taking: Reported on 02/02/2024), Disp: , Rfl:    cefdinir  (OMNICEF ) 300 MG capsule, Take 1 capsule (300 mg total) by mouth 2 (two) times daily. (Patient not taking: Reported on 02/02/2024), Disp: 20 capsule, Rfl: 0   predniSONE  (DELTASONE ) 20 MG tablet, Take 1 tablet (20 mg total) by mouth daily with breakfast. 7 days (Patient not taking: Reported on 02/02/2024), Disp: 7 tablet, Rfl: 1   simethicone  (MYLICON) 80 MG chewable tablet, Place 1 tablet (80 mg total) into feeding tube 4 (four) times daily. (Patient not taking: Reported on 02/02/2024), Disp: , Rfl:  No current facility-administered medications for this visit.  Facility-Administered Medications Ordered in Other Visits:    0.9 %  sodium chloride  infusion, , Intravenous, Continuous, Melanee Annah BROCKS,  MD, Stopped at 02/26/24 1126   cyanocobalamin  (VITAMIN B12) injection 1,000 mcg, 1,000 mcg, Intramuscular, Q30 days, Melanee Annah BROCKS, MD, 1,000 mcg at 03/16/22 1101   zoledronic acid (ZOMETA) 3.5 mg in sodium chloride  0.9 % 100 mL IVPB, 3.5 mg, Intravenous, Q90 days, Melanee Annah BROCKS, MD, Stopped at 02/26/24 1117  Physical exam:  Vitals:   02/26/24 0952  BP: (!) 113/59  Pulse: 65  Resp: 20  Temp: (!) 96.3 F (35.7 C)  TempSrc: Tympanic  SpO2: 98%  Weight: 110 lb 4.8 oz (50 kg)  Height: 5' 1 (1.549 m)   Physical Exam Constitutional:      Comments: She is thin and frail appearing.  Mild areas of erythematous macular rash seen over bilateral forearms  Cardiovascular:     Rate and Rhythm: Normal rate and regular rhythm.     Heart sounds: Normal heart sounds.  Pulmonary:  Effort: Pulmonary effort is normal.     Breath sounds: Normal breath sounds.  Abdominal:     Comments: PEG tube in place.  There is skin excoriation at the site of the PEG tube stopper.  Tubing of the PEG tube is blocked  Skin:    General: Skin is warm and dry.  Neurological:     Mental Status: She is alert and oriented to person, place, and time.      I have personally reviewed labs listed below:    Latest Ref Rng & Units 02/26/2024    9:35 AM  CMP  Glucose 70 - 99 mg/dL 892   BUN 8 - 23 mg/dL 22   Creatinine 9.55 - 1.00 mg/dL 9.24   Sodium 864 - 854 mmol/L 137   Potassium 3.5 - 5.1 mmol/L 4.0   Chloride 98 - 111 mmol/L 102   CO2 22 - 32 mmol/L 28   Calcium  8.9 - 10.3 mg/dL 8.9   Total Protein 6.5 - 8.1 g/dL 6.4   Total Bilirubin 0.0 - 1.2 mg/dL 0.6   Alkaline Phos 38 - 126 U/L 75   AST 15 - 41 U/L 76   ALT 0 - 44 U/L 87       Latest Ref Rng & Units 02/26/2024    9:35 AM  CBC  WBC 4.0 - 10.5 K/uL 4.9   Hemoglobin 12.0 - 15.0 g/dL 87.2   Hematocrit 63.9 - 46.0 % 39.6   Platelets 150 - 400 K/uL 172    I have personally reviewed Radiology images listed below: No images are attached to the  encounter.  MR BRAIN W WO CONTRAST Result Date: 02/06/2024 EXAM: MRI BRAIN WITH AND WITHOUT CONTRAST 02/06/2024 06:30:41 PM TECHNIQUE: Multiplanar multisequence MRI of the head/brain was performed with and without the administration of intravenous contrast. COMPARISON: MRI head July 23, 2019 CLINICAL HISTORY: Primary malignant neoplasm of right lung metastatic to other site. FINDINGS: BRAIN AND VENTRICLES: No acute infarct. No acute intracranial hemorrhage. No mass effect or midline shift. No hydrocephalus. The sella is unremarkable. Normal flow voids. No mass or abnormal enhancement. Remote right basal ganglia lacunar infarct. ORBITS: No acute abnormality. SINUSES: No acute abnormality. BONES AND SOFT TISSUES: Enhancing high left posterior calvarial lesion, compatible with a metastasis. IMPRESSION: 1. No acute abnormality or intracranial metastatic disease. 2. Enhancing high left posterior calvarial lesion, compatible with a metastasis. Electronically signed by: Gilmore Molt MD 02/06/2024 07:38 PM EDT RP Workstation: [id number]     Assessment and plan- Patient is a 68 y.o. female with history of metastatic adenocarcinoma of the lung with bilateral lung and bone metastases with uncommon eGFR mutation recently on afatinib here for a routine follow-up  Assessment and Plan    Metastatic squamous cell carcinoma of the lung with bone metastases, EGFR mutation-positive (uncommon) Stage IV disease with uncommon EGFR mutation, on afatinib 30 mg daily. Improvement noted, mild skin rash present. Zometa recommended for bone health, minimal jawbone risk due to edentulism. - Continue afatinib 30 mg daily. - Reassess afatinib dose in one month. - Administer Zometa infusion today if blood work is satisfactory and port is accessed. - Schedule Zometa infusion every three months.  Skin rash and irritation at feeding tube site, including afatinib-related skin eruption Mild afatinib-related skin rash. Feeding  tube site irritation due to friction and fragile skin, not infected. Managed with Vaseline. - Apply Vaseline to feeding tube site as needed. - Consult with Doctor Pabon for further management of feeding tube site  irritation.  Feeding tube malfunction Feeding tube blocked, causing discomfort, drainage, and bleeding. Improved oral intake and appetite, questioning tube necessity. Discussion needed for potential replacement or removal. - Consult with Doctor Pabon regarding feeding tube management. - Discuss potential replacement or removal of feeding tube with Doctor Pabon.         Visit Diagnosis 1. Primary malignant neoplasm of right lung metastatic to other site [facility] [facility])   2. High risk medication use   3. Encounter for monitoring zoledronic acid therapy      Dr. Annah Skene, MD, MPH [facility] at [facility] [phone] 02/26/2024 12:48 PM

## 2024-02-26 NOTE — Progress Notes (Signed)
 Patient would like MD to take a look at feeding tube, as it seems to be leaking and she cannot get anything through it.

## 2024-02-26 NOTE — Telephone Encounter (Addendum)
 Per Dr. Melanee feeding tube blocked; around the stopper where tube meets the skin, area is excoriated.  Dr. Melanee recommends replacing tube and if cream can be prescribed to apply to skin; patient is currently using vaseline.  Dr. Melanee also mentioned her appetite has improved and is eating better but recommends replacing the tube and keeping it in for the time being.  Outbound call; spoke with representative who indicated if referral is put as urgent their referral coordinator will reach out to the patient. Representative said since [facility] is part of [facility] no need to fax over documents.  Will keep note open to follow up on referral status.

## 2024-02-26 NOTE — Progress Notes (Signed)
 Palliative Medicine [facility] at [facility] [phone] [phone]   Name: Denise Watts Date: 02/26/2024 MRN: [id number]  DOB: 1955/11/01  Patient Care Team: [name], DO as PCP - General (Family Medicine) Darliss Rogue, MD as PCP - Cardiology (Cardiology) Melanee Annah BROCKS, MD as Consulting Physician (Oncology) Jordis Laneta FALCON, MD as Consulting Physician (General Surgery) Dasher, Alm LABOR, MD (Dermatology) Scheeler, Donnice PARAS, PA-C as Physician Assistant (Plastic Surgery) Dorothyann Drivers, MD as Attending Physician (Emergency Medicine) Ferrel, Lionel Sotero RIGGERS (Neurology) French Mering, MD as Referring Physician (Internal Medicine) Shellia Oh, MD as Consulting Physician (Pulmonary Disease) Pa, [facility] (Optometry) Verdene Gills, RN as Oncology Nurse Navigator Lenn Aran, MD as Consulting Physician (Radiation Oncology) Moises, Wanda, RN as [facility], [place], LCSW as [facility], [place], [place]    REASON FOR CONSULTATION: Denise Watts is a 68 y.o. 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  Palliative care was consulted to address goals.  SOCIAL HISTORY:     reports that she quit smoking about 28 years ago. Her smoking use included cigarettes. She started smoking about 58 years ago. She has a 45 pack-year smoking history. She has been exposed to tobacco smoke. She has never used smokeless tobacco. She reports that she does not currently use alcohol after a past usage of about 4.0 standard drinks of alcohol per week. She reports that she does not use drugs.  Patient married lives at home with her husband.  She has a son and three daughters.  Patient previously  worked in designer, fashion/clothing.  ADVANCE DIRECTIVES:    CODE STATUS:   PAST MEDICAL HISTORY: Past Medical History:  Diagnosis Date   Acute pulmonary embolism (HCC) 05/10/2023   Adenomatous colon polyp    Adrenal carcinoma, right (HCC) 12/28/2016   a.) high grade carcinoma with extensive necrosis --> s/p adrenalectomy   Anxiety    Aortic atherosclerosis    Arthritis of right knee    Atrial fibrillation and flutter (HCC)    a.) CHA2DS2-VASc = 3 (age, sex, vascular disease history) as of 02/13/2023; b.) cardiac rate/rhythm maintained intrinsically without pharmacological intervention; no chronic OAC (does take low dose ASA)   B12 deficiency    Breast cancer (HCC) 1997   a.) stage IB (cT1a, cN0, cM0, G3, ER+, PR-, HER2-); s/p lumpectomy + XRT   COPD (chronic obstructive pulmonary disease) (HCC)    Coronary artery disease    a.) cCTA 12/09/2021: Ca2+ = 135 (80th %'ile for age/sex/race match contol) --> 25-49% pLAD and RCA distributions   Depression    Diastolic dysfunction    a.) TTE 12/11/2020, no RWMAs, norm RVSF, G1DD; b.) TTE 12/14/2021: EF 55-60, no RWMAs, norm RVSF, G1DD; c.) TTE 01/27/2023: EF 50-55%, no RWMAs, G1DD, mild RAE, mild MR, mod TR   Dry cough 03/02/2017   Dyspnea    Full dentures    GERD (gastroesophageal reflux disease)    History of hiatal hernia    HLD (hyperlipidemia)    Invasive carcinoma of breast (HCC) 11/15/2017   a.) path (+) for G3, ER -, PR -, HER2/neu - ; s/p lumpectomy 01/17/2018 + 4 cycles TC systemic chemotherapy + XRT   Long term current use of aspirin     Personal history of chemotherapy  Personal history of radiation therapy    Pneumothorax of left lung after biopsy 01/25/2023   a.) hypoxic in PACU requiring re-intubation --> transferred to ICU --> chest tube placed by PCCM   RLS (restless legs syndrome)    a.) on ropinirole    Skin cancer    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   a.) Stage IB (cT1a, cN0, cM0, G3, ER+, PR-, HER2-)     PAST SURGICAL HISTORY:  Past Surgical History:  Procedure Laterality Date   BIOPSY  01/05/2023   Procedure: BIOPSY;  Surgeon: Unk Corinn Skiff, MD;  Location: [facility] END[facility]COPY;  Service: Gastroenterology;;   BREAST BIOPSY Right 1997   positive   BREAST BIOPSY Right 11/15/2017   INVASIVE MAMMARY CARCINOMA triple negative   BREAST BIOPSY Right 07/20/2020   us  bx, Q marker, neg   BREAST LUMPECTOMY Right 1997   2019 also   BREAST LUMPECTOMY WITH SENTINEL LYMPH NODE BIOPSY Right 12/08/2017   Procedure: BREAST LUMPECTOMY WITH SENTINEL LYMPH NODE BX;  Surgeon: Dessa Reyes ORN, MD;  Location: [facility] ORS;  Service: General;  Laterality: Right;   BREAST REDUCTION WITH MASTOPEXY Left 06/10/2019   Procedure: Left breast mastopexy reduction for symmetry;  Surgeon: Lowery Estefana RAMAN, DO;  Location: [facility];  Service: Plastics;  Laterality: Left;  total 2.5 hours   CATARACT EXTRACTION W/PHACO Right 02/07/2022   Procedure: CATARACT EXTRACTION PHACO AND INTRAOCULAR LENS PLACEMENT (IOC) RIGHT DIABETIC;  Surgeon: Myrna Adine Anes, MD;  Location: [facility];  Service: Ophthalmology;  Laterality: Right;  3.60 00:34.7   CATARACT EXTRACTION W/PHACO Left 02/28/2022   Procedure: CATARACT EXTRACTION PHACO AND INTRAOCULAR LENS PLACEMENT (IOC) LEFT DIABETIC 4.11 00:26.1;  Surgeon: Myrna Adine Anes, MD;  Location: [facility];  Service: Ophthalmology;  Laterality: Left;   COLONOSCOPY  2015   COLONOSCOPY N/A 01/05/2023   Procedure: COLONOSCOPY;  Surgeon: Unk Corinn Skiff, MD;  Location: [facility];  Service: Gastroenterology;  Laterality: N/A;   COLONOSCOPY WITH PROPOFOL  N/A 10/29/2019   Procedure: COLONOSCOPY WITH PROPOFOL ;  Surgeon: Jinny Carmine, MD;  Location: [facility];  Service: Endoscopy;  Laterality: N/A;   ESOPHAGOGASTRODUODENOSCOPY (EGD) WITH PROPOFOL  N/A 05/24/2019   Procedure: ESOPHAGOGASTRODUODENOSCOPY (EGD) WITH PROPOFOL ;  Surgeon: Jinny Carmine, MD;   Location: [facility];  Service: Endoscopy;  Laterality: N/A;   ESOPHAGOGASTRODUODENOSCOPY (EGD) WITH PROPOFOL  N/A 09/06/2022   Procedure: ESOPHAGOGASTRODUODENOSCOPY (EGD) WITH PROPOFOL ;  Surgeon: Jinny Carmine, MD;  Location: [facility];  Service: Endoscopy;  Laterality: N/A;   ESOPHAGOGASTRODUODENOSCOPY (EGD) WITH PROPOFOL   01/05/2023   Procedure: ESOPHAGOGASTRODUODENOSCOPY (EGD) WITH PROPOFOL ;  Surgeon: Unk Corinn Skiff, MD;  Location: [facility];  Service: Gastroenterology;;   ESOPHAGUS SURGERY     GASTROSTOMY N/A 05/25/2023   Procedure: OPEN INSERTION OF GASTROSTOMY TUBE;  Surgeon: Jordis Laneta FALCON, MD;  Location: [facility];  Service: General;  Laterality: N/A;   IR IMAGING GUIDED PORT INSERTION  02/07/2023   IR REPLC GASTRO/COLONIC TUBE PERCUT W/FLUORO  09/12/2023   KNEE SURGERY Right    LIPOSUCTION WITH LIPOFILLING Right 06/10/2019   Procedure: right breast scar and capsule release with fat grafting;  Surgeon: Lowery Estefana RAMAN, DO;  Location: [facility];  Service: Plastics;  Laterality: Right;   POLYPECTOMY  01/05/2023   Procedure: POLYPECTOMY;  Surgeon: Unk Corinn Skiff, MD;  Location: [facility] ENDOSCOPY;  Service: Gastroenterology;;   [facility] REMOVAL     PORTACATH PLACEMENT Right 01/17/2018   Procedure: INSERTION PORT-A-CATH- RIGHT;  Surgeon: Dessa Reyes ORN, MD;  Location: [facility];  Service: General;  Laterality: Right;   PULMONARY THROMBECTOMY Bilateral 05/12/2023   Procedure: PULMONARY THROMBECTOMY;  Surgeon: Marea Selinda RAMAN, MD;  Location: [facility];  Service: Cardiovascular;  Laterality: Bilateral;   ROBOTIC ADRENALECTOMY Right 12/28/2016   Procedure: ROBOTIC ADRENALECTOMY;  Surgeon: Penne Knee, MD;  Location: [facility];  Service: Urology;  Laterality: Right;   SKIN CANCER EXCISION     THROAT SURGERY  1998   throat cancer    TUBAL LIGATION     VENTRAL HERNIA REPAIR N/A 03/03/2017   Procedure: HERNIA REPAIR VENTRAL ADULT;  Surgeon:  Shelva Dunnings, MD;  Location: [facility];  Service: General;  Laterality: N/A;   VIDEO BRONCHOSCOPY WITH ENDOBRONCHIAL ULTRASOUND N/A 01/25/2023   Procedure: VIDEO BRONCHOSCOPY WITH ENDOBRONCHIAL ULTRASOUND;  Surgeon: Parris Manna, MD;  Location: [facility];  Service: Thoracic;  Laterality: N/A;   VIDEO BRONCHOSCOPY WITH ENDOBRONCHIAL ULTRASOUND N/A 02/17/2023   Procedure: VIDEO BRONCHOSCOPY WITH ENDOBRONCHIAL ULTRASOUND;  Surgeon: Parris Manna, MD;  Location: [facility];  Service: Thoracic;  Laterality: N/A;    HEMATOLOGY/ONCOLOGY HISTORY:  Oncology History Overview Note  1. Carcinoma of breast, T1, N0, M0 tumor diagnosis.  In January of 1997 2. Carcinoma of the vocal cord T1, N0, M0 tumor.  Status postradiation therapy 3. Abnormal CT scan of the chest (December, 2015) 4. Repeat CT scan of chest shows stable nodule (July, 2016) 5. Neutropenia with normal hemoglobin and normal platelet count (July, 2016)   Cancer of vocal cord [facility]) (Resolved)  11/10/2014 Initial Diagnosis   Cancer of vocal cord   Malignant neoplasm of lower-outer quadrant of right breast of female, estrogen receptor negative (HCC)  11/24/2017 Initial Diagnosis   Malignant neoplasm of lower-outer quadrant of right breast of female, estrogen receptor negative (HCC)   01/12/2018 Cancer Staging   Staging form: Breast, AJCC 8th Edition - Clinical stage from 01/12/2018: Stage IB (cT1a, cN0, cM0, G3, ER+, PR-, HER2-) - Signed by Melanee Annah BROCKS, MD on 01/12/2018   01/30/2018 - 04/26/2018 Chemotherapy   The patient had dexamethasone  (DECADRON ) 4 MG tablet, 8 mg, Oral, 2 times daily, 1 of 1 cycle, Start date: 01/12/2018, End date: 08/03/2018 palonosetron  (ALOXI ) injection 0.25 mg, 0.25 mg, Intravenous,  Once, 4 of 4 cycles Administration: 0.25 mg (01/30/2018), 0.25 mg (02/27/2018), 0.25 mg (03/28/2018), 0.25 mg (04/26/2018) pegfilgrastim  (NEULASTA  ONPRO KIT) injection 6 mg, 6 mg, Subcutaneous, Once, 4 of 4 cycles Administration: 6 mg  (01/30/2018), 6 mg (02/27/2018), 6 mg (03/28/2018), 6 mg (04/26/2018) cyclophosphamide  (CYTOXAN ) 1,000 mg in sodium chloride  0.9 % 250 mL chemo infusion, 600 mg/m2 = 1,000 mg, Intravenous,  Once, 4 of 4 cycles Administration: 1,000 mg (01/30/2018), 1,000 mg (02/27/2018), 1,000 mg (03/28/2018), 1,000 mg (04/26/2018) DOCEtaxel  (TAXOTERE ) 130 mg in sodium chloride  0.9 % 250 mL chemo infusion, 75 mg/m2 = 130 mg, Intravenous,  Once, 4 of 4 cycles Dose modification: 60 mg/m2 (original dose 75 mg/m2, Cycle 2, Reason: Dose not tolerated) Administration: 130 mg (01/30/2018), 100 mg (02/27/2018), 100 mg (03/28/2018), 100 mg (04/26/2018)  for chemotherapy treatment.    Non-small cell lung cancer (HCC)  03/09/2023 Initial Diagnosis   Non-small cell lung cancer (HCC)   03/20/2023 -  Chemotherapy   Patient is on Treatment Plan : LUNG Carboplatin  + Paclitaxel  q7d     Primary malignant neoplasm of right lung metastatic to other site [facility])  01/22/2024 Initial Diagnosis   Primary malignant neoplasm of right lung metastatic to other site [facility])   01/22/2024 Cancer Staging  Staging form: Lung, AJCC V9 - Clinical stage from 01/22/2024: rcT4, rcN3, rpM1 - Signed by Melanee Annah BROCKS, MD on 01/22/2024 Stage prefix: Recurrence     ALLERGIES:  is allergic to sulfa  antibiotics.  MEDICATIONS:  Current Outpatient Medications  Medication Sig Dispense Refill   afatinib dimaleate (GILOTRIF) 30 MG tablet Take 1 tablet (30 mg total) by mouth daily. Take on an empty stomach 1hr before or 2hrs after meals. 30 tablet 1   albuterol  (VENTOLIN  HFA) 108 (90 Base) MCG/ACT inhaler Inhale 2 puffs into the lungs every 6 (six) hours as needed for wheezing or shortness of breath. 51 each 2   apixaban  (ELIQUIS ) 5 MG TABS tablet Take 1 tablet (5 mg total) by mouth 2 (two) times daily. 60 tablet 3   benzonatate  (TESSALON ) 100 MG capsule Take 2 capsules (200 mg total) by mouth 3 (three) times daily as needed for cough.     bisacodyl   (DULCOLAX) 10 MG suppository Place 1 suppository (10 mg total) rectally daily as needed for moderate constipation or severe constipation. (Patient not taking: Reported on 02/02/2024)     buPROPion  (WELLBUTRIN ) 75 MG tablet Take 75 mg by mouth 2 (two) times daily.     busPIRone  (BUSPAR ) 5 MG tablet Take 1 tablet (5 mg total) by mouth 3 (three) times daily as needed. 270 tablet 2   cefdinir  (OMNICEF ) 300 MG capsule Take 1 capsule (300 mg total) by mouth 2 (two) times daily. (Patient not taking: Reported on 02/02/2024) 20 capsule 0   cyclobenzaprine  (FLEXERIL ) 5 MG tablet Take 5 mg by mouth 3 (three) times daily as needed for muscle spasms.     DULoxetine  (CYMBALTA ) 60 MG capsule Take 1 capsule (60 mg total) by mouth daily. 90 capsule 1   fentaNYL  (DURAGESIC ) 12 MCG/HR Place 1 patch onto the skin every 3 (three) days. 10 patch 0   fluticasone  (FLONASE ) 50 MCG/ACT nasal spray Place 2 sprays into both nostrils daily.     Fluticasone -Umeclidin-Vilant (TRELEGY ELLIPTA ) 100-62.5-25 MCG/ACT AEPB Inhale 1 Act into the lungs daily. 1 each 5   Fluticasone -Umeclidin-Vilant (TRELEGY ELLIPTA ) 100-62.5-25 MCG/ACT AEPB Inhale 1 puff into the lungs daily.     gabapentin  (NEURONTIN ) 300 MG capsule Take 300 mg by mouth 2 (two) times daily.     ipratropium-albuterol  (DUONEB) 0.5-2.5 (3) MG/3ML SOLN Take 3 mLs by nebulization every 4 (four) hours as needed.     lactulose , encephalopathy, (CHRONULAC ) 10 GM/15ML SOLN Place 30 mLs (20 g total) into feeding tube 2 (two) times daily. 946 mL 3   lidocaine  (LIDODERM ) 5 % Place 1 patch onto the skin daily. Remove & Discard patch within 12 hours or as directed by MD 30 patch 2   meloxicam  (MOBIC ) 7.5 MG tablet Take 1 tablet (7.5 mg total) by mouth daily. 90 tablet 1   metoprolol  succinate (TOPROL -XL) 25 MG 24 hr tablet TAKE 1 TABLET BY MOUTH DAILY 30 tablet 4   montelukast  (SINGULAIR ) 10 MG tablet TAKE 1 TABLET BY MOUTH AT BEDTIME 90 tablet 0   Nutritional Supplements (NUTREN 1.5)  LIQD Give 1 carton 4 times a day (8am, noon, 4pm and 8pm). Flush with 60ml of water  before and after each feeding.  Give additional 1 1/2 cups of water  a day via tube or drink orally for adequate hydration.     omeprazole  (KONVOMEP ) 2 mg/mL SUSP oral suspension Take 20 mLs (40 mg total) by mouth daily. 240 mL 1   ondansetron  (ZOFRAN ) 4 MG tablet Take 1 tablet (4  mg total) by mouth every 6 (six) hours as needed for nausea or vomiting.     oxyCODONE  (ROXICODONE ) 5 MG/5ML solution Take 5 mLs (5 mg total) by mouth every 8 (eight) hours as needed for severe pain (pain score 7-10). 200 mL 0   predniSONE  (DELTASONE ) 20 MG tablet Take 1 tablet (20 mg total) by mouth daily with breakfast. 7 days (Patient not taking: Reported on 02/02/2024) 7 tablet 1   rOPINIRole  (REQUIP ) 0.5 MG tablet Take 1 tablet (0.5 mg total) by mouth 3 (three) times daily. 90 tablet 1   rosuvastatin  (CRESTOR ) 40 MG tablet Place 1 tablet (40 mg total) into feeding tube daily. 90 tablet 0   senna (SENOKOT) 8.6 MG TABS tablet Place 1 tablet (8.6 mg total) into feeding tube daily.     simethicone  (MYLICON) 80 MG chewable tablet Place 1 tablet (80 mg total) into feeding tube 4 (four) times daily. (Patient not taking: Reported on 02/02/2024)     sucralfate  (CARAFATE ) 1 GM/10ML suspension Place 10 mLs (1 g total) into feeding tube 4 (four) times daily. 1200 mL 2   tolterodine  (DETROL  LA) 4 MG 24 hr capsule Take 4 mg by mouth daily.     triamcinolone  cream (KENALOG ) 0.1 % Apply 1 Application topically 2 (two) times daily.     Water  For Irrigation, Sterile (FREE WATER ) SOLN Place 60 mLs into feeding tube 5 (five) times daily.     No current facility-administered medications for this visit.   Facility-Administered Medications Ordered in Other Visits  Medication Dose Route Frequency Provider Last Rate Last Admin   0.9 %  sodium chloride  infusion   Intravenous Continuous Melanee Annah BROCKS, MD 10 mL/hr at 02/26/24 1043 New Bag at 02/26/24 1043    cyanocobalamin  (VITAMIN B12) injection 1,000 mcg  1,000 mcg Intramuscular Q30 days Rao, Archana C, MD   1,000 mcg at 03/16/22 1101   cyanocobalamin  (VITAMIN B12) injection 1,000 mcg  1,000 mcg Intramuscular Once Rao, Archana C, MD       zoledronic acid (ZOMETA) 3.5 mg in sodium chloride  0.9 % 100 mL IVPB  3.5 mg Intravenous Q90 days Melanee Annah BROCKS, MD        VITAL SIGNS: There were no vitals taken for this visit. There were no vitals filed for this visit.  Estimated body mass index is 20.84 kg/m as calculated from the following:   Height as of an earlier encounter on 02/26/24: 5' 1 (1.549 m).   Weight as of an earlier encounter on 02/26/24: 110 lb 4.8 oz (50 kg).  LABS: CBC:    Component Value Date/Time   WBC 4.9 02/26/2024 0935   WBC 5.8 01/16/2024 1117   HGB 12.7 02/26/2024 0935   HGB 14.0 11/03/2021 1551   HCT 39.6 02/26/2024 0935   HCT 42.1 11/03/2021 1551   PLT 172 02/26/2024 0935   PLT 216 11/03/2021 1551   MCV 97.5 02/26/2024 0935   MCV 97 11/03/2021 1551   MCV 100 04/08/2014 0409   NEUTROABS 3.4 02/26/2024 0935   NEUTROABS 3.5 11/03/2021 1551   NEUTROABS 6.1 04/08/2014 0409   LYMPHSABS 0.8 02/26/2024 0935   LYMPHSABS 1.2 11/03/2021 1551   LYMPHSABS 0.8 (L) 04/08/2014 0409   MONOABS 0.5 02/26/2024 0935   MONOABS 0.7 04/08/2014 0409   EOSABS 0.2 02/26/2024 0935   EOSABS 0.2 11/03/2021 1551   EOSABS 0.0 04/08/2014 0409   BASOSABS 0.0 02/26/2024 0935   BASOSABS 0.0 11/03/2021 1551   BASOSABS 0.0 04/08/2014 0409  Comprehensive Metabolic Panel:    Component Value Date/Time   NA 137 02/26/2024 0935   NA 145 (H) 11/03/2021 1551   NA 143 04/08/2014 0409   K 4.0 02/26/2024 0935   K 3.5 04/08/2014 0409   CL 102 02/26/2024 0935   CL 113 (H) 04/08/2014 0409   CO2 28 02/26/2024 0935   CO2 26 04/08/2014 0409   BUN 22 02/26/2024 0935   BUN 13 11/03/2021 1551   BUN 6 (L) 04/08/2014 0409   CREATININE 0.75 02/26/2024 0935   CREATININE 0.67 04/08/2014 0409   GLUCOSE  107 (H) 02/26/2024 0935   GLUCOSE 84 04/08/2014 0409   CALCIUM  8.9 02/26/2024 0935   CALCIUM  7.5 (L) 04/08/2014 0409   AST 76 (H) 02/26/2024 0935   ALT 87 (H) 02/26/2024 0935   ALT 21 11/26/2012 1019   ALKPHOS 75 02/26/2024 0935   ALKPHOS 87 11/26/2012 1019   BILITOT 0.6 02/26/2024 0935   PROT 6.4 (L) 02/26/2024 0935   PROT 6.3 11/03/2021 1551   PROT 6.4 11/26/2012 1019   ALBUMIN  3.5 02/26/2024 0935   ALBUMIN  4.1 11/03/2021 1551   ALBUMIN  3.5 11/26/2012 1019    RADIOGRAPHIC STUDIES: MR BRAIN W WO CONTRAST Result Date: 02/06/2024 EXAM: MRI BRAIN WITH AND WITHOUT CONTRAST 02/06/2024 06:30:41 PM TECHNIQUE: Multiplanar multisequence MRI of the head/brain was performed with and without the administration of intravenous contrast. COMPARISON: MRI head July 23, 2019 CLINICAL HISTORY: Primary malignant neoplasm of right lung metastatic to other site. FINDINGS: BRAIN AND VENTRICLES: No acute infarct. No acute intracranial hemorrhage. No mass effect or midline shift. No hydrocephalus. The sella is unremarkable. Normal flow voids. No mass or abnormal enhancement. Remote right basal ganglia lacunar infarct. ORBITS: No acute abnormality. SINUSES: No acute abnormality. BONES AND SOFT TISSUES: Enhancing high left posterior calvarial lesion, compatible with a metastasis. IMPRESSION: 1. No acute abnormality or intracranial metastatic disease. 2. Enhancing high left posterior calvarial lesion, compatible with a metastasis. Electronically signed by: Gilmore Molt MD 02/06/2024 07:38 PM EDT RP Workstation: [id number]    PERFORMANCE STATUS (ECOG) : 2 - Symptomatic, <50% confined to bed  Review of Systems Unless otherwise noted, a complete review of systems is negative.  Physical Exam General: NAD, thin appearing Pulmonary: Unlabored Extremities: no edema, no joint deformities Skin: no rashes Neurological: Weakness but otherwise nonfocal  IMPRESSION: Follow-up visit.  Patient seen in infusion.  She  is accompanied by her husband.  Patient feels she is doing better since starting Gilotrif.  She feels she is tolerating well.  Patient says she is having some difficulty with her PEG and is being referred back to Dr. Jordis.   She feels that things are going well at home.  Performance status is stable.  She is able to provide for her own care.  Patient verbalized agreement with current scope of treatment.  She says that she wants to pursue any and all options available to save me.  Patient does not have advance directives and I have encouraged completion in the past.  I sent her home with another MOST form today and encouraged her to consider end-of-life decision making.  She says that she has not given much though on these decisions.  PLAN: - Continue current scope of treatment - MOST Form reviewed -RTC 1 month  Patient expressed understanding and was in agreement with this plan. She also understands that She can call the clinic at any time with any questions, concerns, or complaints.     Time Total: 15  minutes  Visit consisted of counseling and education dealing with the complex and emotionally intense issues of symptom management and palliative care in the setting of serious and potentially life-threatening illness.Greater than 50%  of this time was spent counseling and coordinating care related to the above assessment and plan.  Signed by: Fonda Mower, PhD, NP-C

## 2024-02-27 ENCOUNTER — Ambulatory Visit
Admission: RE | Admit: 2024-02-27 | Discharge: 2024-02-27 | Disposition: A | Discharge status: Home / Self Care | Source: Ambulatory Visit | Attending: Surgery | Admitting: Surgery

## 2024-02-27 ENCOUNTER — Encounter: Payer: Self-pay | Admitting: Oncology

## 2024-02-27 ENCOUNTER — Encounter: Payer: Self-pay | Admitting: Surgery

## 2024-02-27 DIAGNOSIS — Z931 Gastrostomy status: Secondary | ICD-10-CM

## 2024-02-27 DIAGNOSIS — Z431 Encounter for attention to gastrostomy: Secondary | ICD-10-CM | POA: Diagnosis not present

## 2024-02-27 DIAGNOSIS — K9423 Gastrostomy malfunction: Secondary | ICD-10-CM | POA: Diagnosis not present

## 2024-02-27 HISTORY — PX: IR REPLACE G-TUBE SIMPLE WO FLUORO: IMG2323

## 2024-02-27 NOTE — Telephone Encounter (Signed)
 Patient scheduled to see Dr. Jordis on 03/11/24 at 9:00am.

## 2024-03-01 ENCOUNTER — Encounter: Payer: Self-pay | Admitting: Oncology

## 2024-03-07 ENCOUNTER — Ambulatory Visit: Admitting: Internal Medicine

## 2024-03-08 ENCOUNTER — Other Ambulatory Visit: Payer: Self-pay

## 2024-03-08 MED ORDER — OXYCODONE HCL 5 MG/5ML PO SOLN: 5.0000 mg | Freq: Three times a day (TID) | ORAL | 0 refills | Status: DC | PRN

## 2024-03-11 ENCOUNTER — Encounter: Payer: Self-pay | Admitting: Surgery

## 2024-03-11 ENCOUNTER — Ambulatory Visit: Admitting: Internal Medicine

## 2024-03-11 ENCOUNTER — Ambulatory Visit: Admitting: Surgery

## 2024-03-11 ENCOUNTER — Encounter: Payer: Self-pay | Admitting: Internal Medicine

## 2024-03-11 VITALS — BP 92/63 | HR 69 | Temp 98.2°F | Ht 61.0 in | Wt 109.0 lb

## 2024-03-11 VITALS — BP 100/60 | HR 71 | Temp 97.3°F | Ht 61.0 in | Wt 109.4 lb

## 2024-03-11 DIAGNOSIS — Z931 Gastrostomy status: Secondary | ICD-10-CM

## 2024-03-11 DIAGNOSIS — Z431 Encounter for attention to gastrostomy: Secondary | ICD-10-CM | POA: Diagnosis not present

## 2024-03-11 DIAGNOSIS — C3490 Malignant neoplasm of unspecified part of unspecified bronchus or lung: Secondary | ICD-10-CM | POA: Diagnosis not present

## 2024-03-11 DIAGNOSIS — J449 Chronic obstructive pulmonary disease, unspecified: Secondary | ICD-10-CM

## 2024-03-11 DIAGNOSIS — J9611 Chronic respiratory failure with hypoxia: Secondary | ICD-10-CM

## 2024-03-11 DIAGNOSIS — I2692 Saddle embolus of pulmonary artery without acute cor pulmonale: Secondary | ICD-10-CM

## 2024-03-11 NOTE — Progress Notes (Signed)
 Outpatient Surgical Follow Up  03/11/2024  Denise Watts is an 68 y.o. female.   Chief Complaint  Patient presents with   Follow-up    HPI: 68 yo female multiple malignancies in the past now presenting with stage IV non-small cell lung cancer with bone and lymph node metastases . Recent exchange of G tube by IR, irritation is better, D/W her that there will be always some discomofrt and irritation from G tube as is a foreign body. She understands it. She is using g tube daily to support her nutrition  Past Medical History:  Diagnosis Date   Acute pulmonary embolism (HCC) 05/10/2023   Adenomatous colon polyp    Adrenal carcinoma, right (HCC) 12/28/2016   a.) high grade carcinoma with extensive necrosis --> s/p adrenalectomy   Anxiety    Aortic atherosclerosis    Arthritis of right knee    Atrial fibrillation and flutter (HCC)    a.) CHA2DS2-VASc = 3 (age, sex, vascular disease history) as of 02/13/2023; b.) cardiac rate/rhythm maintained intrinsically without pharmacological intervention; no chronic OAC (does take low dose ASA)   B12 deficiency    Breast cancer (HCC) 1997   a.) stage IB (cT1a, cN0, cM0, G3, ER+, PR-, HER2-); s/p lumpectomy + XRT   COPD (chronic obstructive pulmonary disease) (HCC)    Coronary artery disease    a.) cCTA 12/09/2021: Ca2+ = 135 (80th %'ile for age/sex/race match contol) --> 25-49% pLAD and RCA distributions   Depression    Diastolic dysfunction    a.) TTE 12/11/2020, no RWMAs, norm RVSF, G1DD; b.) TTE 12/14/2021: EF 55-60, no RWMAs, norm RVSF, G1DD; c.) TTE 01/27/2023: EF 50-55%, no RWMAs, G1DD, mild RAE, mild MR, mod TR   Dry cough 03/02/2017   Dyspnea    Full dentures    GERD (gastroesophageal reflux disease)    History of hiatal hernia    HLD (hyperlipidemia)    Invasive carcinoma of breast (HCC) 11/15/2017   a.) path (+) for G3, ER -, PR -, HER2/neu - ; s/p lumpectomy 01/17/2018 + 4 cycles TC systemic chemotherapy + XRT   Long term current use  of aspirin     Personal history of chemotherapy    Personal history of radiation therapy    Pneumothorax of left lung after biopsy 01/25/2023   a.) hypoxic in PACU requiring re-intubation --> transferred to ICU --> chest tube placed by PCCM   RLS (restless legs syndrome)    a.) on ropinirole    Skin cancer    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   a.) Stage IB (cT1a, cN0, cM0, G3, ER+, PR-, HER2-)    Past Surgical History:  Procedure Laterality Date   BIOPSY  01/05/2023   Procedure: BIOPSY;  Surgeon: Unk Corinn Skiff, MD;  Location: [facility];  Service: Gastroenterology;;   BREAST BIOPSY Right 1997   positive   BREAST BIOPSY Right 11/15/2017   INVASIVE MAMMARY CARCINOMA triple negative   BREAST BIOPSY Right 07/20/2020   us  bx, Q marker, neg   BREAST LUMPECTOMY Right 1997   2019 also   BREAST LUMPECTOMY WITH SENTINEL LYMPH NODE BIOPSY Right 12/08/2017   Procedure: BREAST LUMPECTOMY WITH SENTINEL LYMPH NODE BX;  Surgeon: Dessa Reyes ORN, MD;  Location: [facility];  Service: General;  Laterality: Right;   BREAST REDUCTION WITH MASTOPEXY Left 06/10/2019   Procedure: Left breast mastopexy reduction for symmetry;  Surgeon: Lowery Estefana RAMAN, DO;  Location: [facility];  Service: Plastics;  Laterality: Left;  total 2.5 hours   CATARACT EXTRACTION W/PHACO Right 02/07/2022   Procedure: CATARACT EXTRACTION PHACO AND INTRAOCULAR LENS PLACEMENT (IOC) RIGHT DIABETIC;  Surgeon: Myrna Adine Anes, MD;  Location: [facility];  Service: Ophthalmology;  Laterality: Right;  3.60 00:34.7   CATARACT EXTRACTION W/PHACO Left 02/28/2022   Procedure: CATARACT EXTRACTION PHACO AND INTRAOCULAR LENS PLACEMENT (IOC) LEFT DIABETIC 4.11 00:26.1;  Surgeon: Myrna Adine Anes, MD;  Location: [facility];  Service: Ophthalmology;  Laterality: Left;   COLONOSCOPY  2015   COLONOSCOPY N/A 01/05/2023   Procedure: COLONOSCOPY;  Surgeon: Unk Corinn Skiff, MD;  Location:  [facility];  Service: Gastroenterology;  Laterality: N/A;   COLONOSCOPY WITH PROPOFOL  N/A 10/29/2019   Procedure: COLONOSCOPY WITH PROPOFOL ;  Surgeon: Jinny Carmine, MD;  Location: [facility];  Service: Endoscopy;  Laterality: N/A;   ESOPHAGOGASTRODUODENOSCOPY (EGD) WITH PROPOFOL  N/A 05/24/2019   Procedure: ESOPHAGOGASTRODUODENOSCOPY (EGD) WITH PROPOFOL ;  Surgeon: Jinny Carmine, MD;  Location: [facility];  Service: Endoscopy;  Laterality: N/A;   ESOPHAGOGASTRODUODENOSCOPY (EGD) WITH PROPOFOL  N/A 09/06/2022   Procedure: ESOPHAGOGASTRODUODENOSCOPY (EGD) WITH PROPOFOL ;  Surgeon: Jinny Carmine, MD;  Location: [facility];  Service: Endoscopy;  Laterality: N/A;   ESOPHAGOGASTRODUODENOSCOPY (EGD) WITH PROPOFOL   01/05/2023   Procedure: ESOPHAGOGASTRODUODENOSCOPY (EGD) WITH PROPOFOL ;  Surgeon: Unk Corinn Skiff, MD;  Location: [facility];  Service: Gastroenterology;;   ESOPHAGUS SURGERY     GASTROSTOMY N/A 05/25/2023   Procedure: OPEN INSERTION OF GASTROSTOMY TUBE;  Surgeon: Jordis Laneta FALCON, MD;  Location: [facility];  Service: General;  Laterality: N/A;   IR IMAGING GUIDED PORT INSERTION  02/07/2023   IR REPLACE G-TUBE SIMPLE WO FLUORO  02/27/2024   IR REPLC GASTRO/COLONIC TUBE PERCUT W/FLUORO  09/12/2023   KNEE SURGERY Right    LIPOSUCTION WITH LIPOFILLING Right 06/10/2019   Procedure: right breast scar and capsule release with fat grafting;  Surgeon: Lowery Estefana RAMAN, DO;  Location: [facility];  Service: Plastics;  Laterality: Right;   POLYPECTOMY  01/05/2023   Procedure: POLYPECTOMY;  Surgeon: Unk Corinn Skiff, MD;  Location: [facility];  Service: Gastroenterology;;   [facility] REMOVAL     PORTACATH PLACEMENT Right 01/17/2018   Procedure: INSERTION PORT-A-CATH- RIGHT;  Surgeon: Dessa Reyes ORN, MD;  Location: [facility];  Service: General;  Laterality: Right;   PULMONARY THROMBECTOMY Bilateral 05/12/2023   Procedure: PULMONARY THROMBECTOMY;  Surgeon: Marea Selinda RAMAN, MD;  Location: [facility];  Service: Cardiovascular;  Laterality: Bilateral;   ROBOTIC ADRENALECTOMY Right 12/28/2016   Procedure: ROBOTIC ADRENALECTOMY;  Surgeon: Penne Knee, MD;  Location: [facility];  Service: Urology;  Laterality: Right;   SKIN CANCER EXCISION     THROAT SURGERY  1998   throat cancer    TUBAL LIGATION     VENTRAL HERNIA REPAIR N/A 03/03/2017   Procedure: HERNIA REPAIR VENTRAL ADULT;  Surgeon: Shelva Dunnings, MD;  Location: [facility];  Service: General;  Laterality: N/A;   VIDEO BRONCHOSCOPY WITH ENDOBRONCHIAL ULTRASOUND N/A 01/25/2023   Procedure: VIDEO BRONCHOSCOPY WITH ENDOBRONCHIAL ULTRASOUND;  Surgeon: Parris Manna, MD;  Location: [facility];  Service: Thoracic;  Laterality: N/A;   VIDEO BRONCHOSCOPY WITH ENDOBRONCHIAL ULTRASOUND N/A 02/17/2023   Procedure: VIDEO BRONCHOSCOPY WITH ENDOBRONCHIAL ULTRASOUND;  Surgeon: Parris Manna, MD;  Location: [facility];  Service: Thoracic;  Laterality: N/A;    Family History  Problem Relation Age of Onset   Diabetes Mother    Heart attack Mother    Cancer Sister        cancer of  eyes, spread to lung, other places. died at 19   Cancer Sister 78       unknown primary, spread to bone, brain died in 61's   Cancer Paternal Aunt        unk type   Cancer Paternal Grandmother        type unk   Prostate cancer Neg Hx    Kidney cancer Neg Hx    Bladder Cancer Neg Hx    Breast cancer Neg Hx     Social History:  reports that she quit smoking about 28 years ago. Her smoking use included cigarettes. She started smoking about 58 years ago. She has a 45 pack-year smoking history. She has been exposed to tobacco smoke. She has never used smokeless tobacco. She reports that she does not currently use alcohol after a past usage of about 4.0 standard drinks of alcohol per week. She reports that she does not use drugs.  Allergies:  Allergies  Allergen Reactions   Sulfa  Antibiotics Other (See Comments)    Mouth sore and  raw    Medications reviewed.    ROS Full ROS performed and is otherwise negative other than what is stated in HPI   BP 92/63   Pulse 69   Temp 98.2 F (36.8 C) (Oral)   Ht 5' 1 (1.549 m)   Wt 109 lb (49.4 kg)   SpO2 97%   BMI 20.60 kg/m   Physical Exam Vitals and nursing note reviewed. Exam conducted with a chaperone present.  Constitutional:      Appearance: She is ill-appearing.     Comments: Chronically ill and debilitated but not acutely decompensated  Pulmonary:     Effort: Pulmonary effort is normal.     Breath sounds: No stridor.  Abdominal:     General: Abdomen is flat. There is no distension.     Palpations: Abdomen is soft. There is no mass.     Tenderness: There is no abdominal tenderness.     Hernia: No hernia is present.     Comments: G tube in place , no infection, expected irritation around exit site, no peritontis  Skin:    General: Skin is warm and dry.     Capillary Refill: Capillary refill takes less than 2 seconds.  Neurological:     General: No focal deficit present.     Mental Status: She is alert and oriented to person, place, and time.  Psychiatric:        Mood and Affect: Mood normal.        Behavior: Behavior normal.        Thought Content: Thought content normal.        Judgment: Judgment normal.      Assessment/Plan: I personally spent a total of 20 minutes in the care of the patient today including performing a medically appropriate exam/evaluation, counseling and educating, placing orders, referring and communicating with other health care professionals, documenting clinical information in the EHR, independently interpreting and reviewing images studies and coordinating care.   Laneta Luna, MD [facility] General Surgeon

## 2024-03-11 NOTE — Patient Instructions (Signed)
 Please give our office a call if you have any questions or concerns    How to Care for a Feeding Tube A feeding tube is a soft, flexible tube used to give medicine, water , and liquid food. A person may need a feeding tube if they have trouble swallowing or cannot have food or medicine by mouth. The tube is put right into the stomach.  The following information gives steps on how to care for the skin around a feeding tube, or the tube site. This information is meant for adults and children over 1 year of age. Supplies needed: Clean washcloth, gauze pads, or soft paper towels. Cotton swabs. Skin barrier ointment or cream, such as petroleum jelly. Soap and water , or sterile saline. Pre-cut foam pads or gauze for around the tube. Medical tape. Anchoring device. This is not always used. Syringe. Cleaning brush or toothbrush. This is only used for cleanings. How to care for the tube site  Have all supplies ready and near you. Wash your hands with soap and water  for at least 20 seconds. Remove the foam pad or gauze under the tube stabilizing disc (bumper), if there is one. You may have to do this a few times a day right after the feeding tube is put in because the gauze or pad will get soiled or wet. As the site heals, you may not have to replace the pads or gauze as often. But you still need to clean and check the area every day. Gently turn the bumper so it does not stick to the skin. Check the skin around the tube site for redness, rash, swelling, drainage, or growth of extra tissue. Check the number on the tube (guide mark) where it meets the skin. It should not change. If it does, the feeding tube might be coming out. Moisten gauze pads and cotton swabs with water  and soap, or saline. Take the moistenedcotton swab and wipe under the bumper, right near the opening in the belly (stoma). Take the moistened gauze pad and clean the skin around the tube site. If you used soap, rinse with  water . Use a washcloth, dry gauze pad, or soft paper towel to dry the skin and stoma site. Dry the tube and bumper too. If the skin is red, put a barrier cream or ointment on a cotton swab. Apply it around the site, under the bumper. This will help the site heal. Put a new pre-cut foam pad or gauze around the tube, under the bumper. If the site is healed and there is no drainage, you can leave off the foam pads or gauze. Put tape around the edges of the foam pad or gauze to keep it in place. Use tape or an anchoring device to hold the loose end of the tube against the skin. This helps keep the tube from getting pulled on. Change where you put the tape to avoid damaging the skin. Throw away used supplies. Wash your hands with soap and water  for at least 20 seconds. How to care for the moat If the end of the feeding tube has a moat, clean it once a day or as told. The moat is the open space inside the feeding tube connector. Follow the manufacturer's instructions on how to clean the moat. You may be told to: Remove the cap from the end of the feeding tube. Cover the hole in the middle of the tube port with a cleaning brush. Hold the end of the tube over a deep  bowl, or wrap it with paper towels or a washcloth to soak up the water . Use a syringe of water  to flush out the moat. Use the cleaning brush or toothbrush to clean the tube cap and around the inside of the moat. This brush can only be used for tube cleanings. Dry the end of the feeding tube and the cap with gauze or paper towel. Put the cap back on the feeding tube. General tips Use, clean, and reuse feeding tube equipment only as told by the health care provider. Do not use antibiotic ointment or cream on the feeding tube site unless you're told to. Contact a health care provider if: You notice a change in the guide marks on the feeding tube where it meets the skin. Changes could mean the feeding tube has moved or is coming out. You see  any of these on the skin around the tube site: Redness. Rash. Swelling. Drainage. Extra growth of tissue. You have questions or concerns about the feeding tube or the tube site. This information is not intended to replace advice given to you by your health care provider. Make sure you discuss any questions you have with your health care provider. Document Revised: 08/18/2022 Document Reviewed: 08/18/2022 Elsevier Patient Education  2024 [employer].

## 2024-03-11 NOTE — Progress Notes (Unsigned)
  Pulmonary, Critical Care, and Sleep Medicine  CC Assessment of COPD Assessment for abnormal CT chest pulmonary nodules Assessment for pulmonary embolism   Past Medical History:  Anxiety, OA, Breast cancer, Vocal cord cancer, Depression, GERD, Hiatal hernia, Parkinson disease, Restless leg syndrome  Past Surgical History:  She  has a past surgical history that includes Skin cancer excision; Knee surgery (Right); Throat surgery (1998); Esophagus surgery; Tubal ligation; Robotic adrenalectomy (Right, 12/28/2016); Ventral hernia repair (N/A, 03/03/2017); Colonoscopy (2015); Breast lumpectomy with sentinel lymph node bx (Right, 12/08/2017); Portacath placement (Right, 01/17/2018); Breast lumpectomy (Right, 1997); Port-a-cath removal; Esophagogastroduodenoscopy (egd) with propofol  (N/A, 05/24/2019); Breast reduction with mastopexy (Left, 06/10/2019); Liposuction with lipofilling (Right, 06/10/2019); Colonoscopy with propofol  (N/A, 10/29/2019); Breast biopsy (Right, 1997); Breast biopsy (Right, 11/15/2017); Breast biopsy (Right, 07/20/2020); Cataract extraction w/PHACO (Right, 02/07/2022); Cataract extraction w/PHACO (Left, 02/28/2022); Esophagogastroduodenoscopy (egd) with propofol  (N/A, 09/06/2022); Colonoscopy (N/A, 01/05/2023); Esophagogastroduodenoscopy (egd) with propofol  (01/05/2023); biopsy (01/05/2023); polypectomy (01/05/2023); Video bronchoscopy with endobronchial ultrasound (N/A, 01/25/2023); IR IMAGING GUIDED PORT INSERTION (02/07/2023); Video bronchoscopy with endobronchial ultrasound (N/A, 02/17/2023); PULMONARY THROMBECTOMY (Bilateral, 05/12/2023); Gastrostomy (N/A, 05/25/2023); IR Replc Gastro/Colonic Tube Percut W/Fluoro (09/12/2023); and IR REPLACE G-TUBE SIMPLE WO FLUORO (02/27/2024).  Brief Summary:  Denise Watts is a 68 y.o. female former smoker with chronic cough.      Subjective:  Assessment of COPD Moderate COPD FEV1 63% predicted No exacerbation at this time No evidence of  heart failure at this time No evidence or signs of infection at this time No respiratory distress No fevers, chills, nausea, vomiting, diarrhea No evidence of lower extremity edema No evidence hemoptysis Continue Trelegy as prescribed   Patient on anticoagulation for pulmonary embolism-January 2025 positive PE   Physical Exam:   BP 100/60   Temp (!) 97.3 F (36.3 C)   Ht 5' 1 (1.549 m)   Wt 109 lb 6.4 oz (49.6 kg)   BMI 20.67 kg/m    Physical Examination:  General Appearance: No distress  EYES EOM intact.   NECK Supple, No JVD Pulmonary: normal breath sounds, No wheezing.  CardiovascularNormal S1,S2.  No m/r/g.   Ext pulses intact, cap refill intact  ALL OTHER ROS ARE NEGATIVE      Pulmonary testing:    Chest Imaging:  CT chest 08/14/19 >> centrilobular emphysema, scarring RUL, 3 mm nodule RML, 3 mm nodule RLL, mild GGO 1.2 x 0.9 cm RLL, 4 mm nodule LUL, 1.1 x 1.2 GGO LUL, 1.2 x 1 cm nodule LLL CT chest 08/21/20 >> moderate centrilobular emphysema, stable nodules except new LLL 8 mm GGO CT chest JAN 2025-+PE, Segmental and subsegmental pulmonary emboli in the right lower  Positive for acute PE  Similar appearance of multiple pulmonary nodules Pulmonary function test 2024 March FEV1 FVC ratio 60% predicted FEV1 62% predicted Moderate obstructive lung disease  Social History:  She  reports that she quit smoking about 28 years ago. Her smoking use included cigarettes. She started smoking about 58 years ago. She has a 45 pack-year smoking history. She has been exposed to tobacco smoke. She has never used smokeless tobacco. She reports that she does not currently use alcohol after a past usage of about 4.0 standard drinks of alcohol per week. She reports that she does not use drugs.  Family History:  Her family history includes Cancer in her paternal aunt, paternal grandmother, and sister; Cancer (age of onset: 67) in her sister; Diabetes in her mother; Heart attack in  her mother.  Assessment/Plan:   68 y.o. female with history of head and neck, breast cancer as well as adrenal carcinoma of unknown primary in the past now with non-small cell carcinoma of unknown primary predominantly with mediastinal adenopathy. She has  being treated as possible non-small cell lung cancer. She is s/p concurrent chemoradiation and here for routine follow-up , patient with diagnosis of pulmonary embolism January 2025 Segmental and subsegmental pulmonary emboli in the right lower lobe pulmonary arteries   Assessment of COPD Stable at this time No exacerbation at this time No evidence of heart failure at this time Trelegy inhaler 100 Avoid Allergens and Irritants Avoid secondhand smoke Avoid SICK contacts Recommend  Masking  when appropriate Recommend Keep up-to-date with vaccinations  Chronic Hypoxic resp failure due to COPD -Patient benefits from oxygen  therapy 2L [place]  -recommend using oxygen  as prescribed -patient needs this for survival    Lung nodules extensive oncological history CT chest August 15, 2023 reviewed in detail Repeat CT chest April 2025 did not show any significant changes in pulmonary nodules Follow-up with oncology on SEPT 5 with follow-up CT chest pending   History of breast cancer and vocal cord cancer. - followed by Dr. Annah Watts with [facility] in [place]  Acute PE diagnosed January 2025 Patient was unaware of her diagnosis Continue anticoagulation as prescribed Patient asked for refill which I have provided  Medication List:   Allergies as of 03/11/2024       Reactions   Sulfa  Antibiotics Other (See Comments)   Mouth sore and raw        Medication List        Accurate as of March 11, 2024  2:52 PM. If you have any questions, ask your nurse or doctor.          STOP taking these medications    simethicone  80 MG chewable tablet Commonly known as: MYLICON Stopped by: Diego F Pabon        TAKE these medications    afatinib dimaleate 30 MG tablet Commonly known as: GILOTRIF Take 1 tablet (30 mg total) by mouth daily. Take on an empty stomach 1hr before or 2hrs after meals.   albuterol  108 (90 Base) MCG/ACT inhaler Commonly known as: VENTOLIN  HFA Inhale 2 puffs into the lungs every 6 (six) hours as needed for wheezing or shortness of breath.   apixaban  5 MG Tabs tablet Commonly known as: Eliquis  Take 1 tablet (5 mg total) by mouth 2 (two) times daily.   benzonatate  100 MG capsule Commonly known as: TESSALON  Take 2 capsules (200 mg total) by mouth 3 (three) times daily as needed for cough.   bisacodyl  10 MG suppository Commonly known as: DULCOLAX Place 1 suppository (10 mg total) rectally daily as needed for moderate constipation or severe constipation.   buPROPion  75 MG tablet Commonly known as: WELLBUTRIN  Take 75 mg by mouth 2 (two) times daily.   busPIRone  5 MG tablet Commonly known as: BUSPAR  Take 1 tablet (5 mg total) by mouth 3 (three) times daily as needed.   cefdinir  300 MG capsule Commonly known as: OMNICEF  Take 1 capsule (300 mg total) by mouth 2 (two) times daily.   cyclobenzaprine  5 MG tablet Commonly known as: FLEXERIL  Take 5 mg by mouth 3 (three) times daily as needed for muscle spasms.   DULoxetine  60 MG capsule Commonly known as: Cymbalta  Take 1 capsule (60 mg total) by mouth daily.   fentaNYL  12 MCG/HR Commonly known as: DURAGESIC  Place 1  patch onto the skin every 3 (three) days.   fluticasone  50 MCG/ACT nasal spray Commonly known as: FLONASE  Place 2 sprays into both nostrils daily.   free water  Soln Place 60 mLs into feeding tube 5 (five) times daily.   gabapentin  300 MG capsule Commonly known as: NEURONTIN  Take 300 mg by mouth 2 (two) times daily.   ipratropium-albuterol  0.5-2.5 (3) MG/3ML Soln Commonly known as: DUONEB Take 3 mLs by nebulization every 4 (four) hours as needed.   lactulose  (encephalopathy) 10 GM/15ML  Soln Commonly known as: CHRONULAC  Place 30 mLs (20 g total) into feeding tube 2 (two) times daily.   lidocaine  5 % Commonly known as: LIDODERM  Place 1 patch onto the skin daily. Remove & Discard patch within 12 hours or as directed by MD   meloxicam  7.5 MG tablet Commonly known as: MOBIC  Take 1 tablet (7.5 mg total) by mouth daily.   metoprolol  succinate 25 MG 24 hr tablet Commonly known as: TOPROL -XL TAKE 1 TABLET BY MOUTH DAILY   montelukast  10 MG tablet Commonly known as: SINGULAIR  TAKE 1 TABLET BY MOUTH AT BEDTIME   Nutren 1.5 Liqd Give 1 carton 4 times a day (8am, noon, 4pm and 8pm). Flush with 60ml of water  before and after each feeding.  Give additional 1 1/2 cups of water  a day via tube or drink orally for adequate hydration.   omeprazole  2 mg/mL Susp oral suspension Commonly known as: KONVOMEP  Take 20 mLs (40 mg total) by mouth daily.   ondansetron  4 MG tablet Commonly known as: Zofran  Take 1 tablet (4 mg total) by mouth every 6 (six) hours as needed for nausea or vomiting.   oxyCODONE  5 MG/5ML solution Commonly known as: ROXICODONE  Take 5 mLs (5 mg total) by mouth every 8 (eight) hours as needed for severe pain (pain score 7-10).   predniSONE  20 MG tablet Commonly known as: DELTASONE  Take 1 tablet (20 mg total) by mouth daily with breakfast. 7 days   rOPINIRole  0.5 MG tablet Commonly known as: REQUIP  Take 1 tablet (0.5 mg total) by mouth 3 (three) times daily.   rosuvastatin  40 MG tablet Commonly known as: CRESTOR  PLACE 1 TABLET (40 MG TOTAL) INTO FEEDING TUBE DAILY   senna 8.6 MG Tabs tablet Commonly known as: SENOKOT Place 1 tablet (8.6 mg total) into feeding tube daily.   sucralfate  1 GM/10ML suspension Commonly known as: CARAFATE  Place 10 mLs (1 g total) into feeding tube 4 (four) times daily.   tolterodine  4 MG 24 hr capsule Commonly known as: DETROL  LA Take 4 mg by mouth daily.   Trelegy Ellipta  100-62.5-25 MCG/ACT Aepb Generic drug:  Fluticasone -Umeclidin-Vilant Inhale 1 Act into the lungs daily.   Trelegy Ellipta  100-62.5-25 MCG/ACT Aepb Generic drug: Fluticasone -Umeclidin-Vilant Inhale 1 puff into the lungs daily.   triamcinolone  cream 0.1 % Commonly known as: KENALOG  Apply 1 Application topically 2 (two) times daily.        MEDICATION ADJUSTMENTS/LABS AND TESTS ORDERED: TRELEGY one puff once per day Please rinse mouth after every use Continue Medication for Blood Clot Eliquis  Follow up with Dr Melanee Continue anticoagulation for pulmonary embolism  CURRENT MEDICATIONS REVIEWED AT LENGTH WITH PATIENT TODAY   Patient  satisfied with Plan of action and management. All questions answered   Follow-up in 6 months  I spent a total of 45 minutes dedicated to the care of this patient on the date of this encounter to include pre-visit review of records, face-to-face time with the patient discussing conditions above, post  visit ordering of testing, clinical documentation with the electronic health record, making appropriate referrals as documented, and communicating necessary information to the patient's healthcare team.    The Patient requires high complexity decision making for assessment and support, frequent evaluation and titration of therapies, application of advanced monitoring technologies and extensive interpretation of multiple databases.  Patient satisfied with Plan of action and management. All questions answered    Nickolas Alm Cellar, M.D.  [facility]  Medical Director [facility] Medical Director [facility] Cardio-Pulmonary Department

## 2024-03-11 NOTE — Progress Notes (Deleted)
 [name], Critical Care, and Sleep Medicine  CC Assessment of COPD  Assessment for abnormal CT chest pulmonary nodules   Past Medical History:  Anxiety, OA, Breast cancer, Vocal cord cancer, Depression, GERD, Hiatal hernia, Parkinson disease, Restless leg syndrome  Past Surgical History:  She  has a past surgical history that includes Skin cancer excision; Knee surgery (Right); Throat surgery (1998); Esophagus surgery; Tubal ligation; Robotic adrenalectomy (Right, 12/28/2016); Ventral hernia repair (N/A, 03/03/2017); Colonoscopy (2015); Breast lumpectomy with sentinel lymph node bx (Right, 12/08/2017); Portacath placement (Right, 01/17/2018); Breast lumpectomy (Right, 1997); Port-a-cath removal; Esophagogastroduodenoscopy (egd) with propofol  (N/A, 05/24/2019); Breast reduction with mastopexy (Left, 06/10/2019); Liposuction with lipofilling (Right, 06/10/2019); Colonoscopy with propofol  (N/A, 10/29/2019); Breast biopsy (Right, 1997); Breast biopsy (Right, 11/15/2017); Breast biopsy (Right, 07/20/2020); Cataract extraction w/PHACO (Right, 02/07/2022); Cataract extraction w/PHACO (Left, 02/28/2022); Esophagogastroduodenoscopy (egd) with propofol  (N/A, 09/06/2022); Colonoscopy (N/A, 01/05/2023); Esophagogastroduodenoscopy (egd) with propofol  (01/05/2023); biopsy (01/05/2023); polypectomy (01/05/2023); Video bronchoscopy with endobronchial ultrasound (N/A, 01/25/2023); IR IMAGING GUIDED PORT INSERTION (02/07/2023); Video bronchoscopy with endobronchial ultrasound (N/A, 02/17/2023); PULMONARY THROMBECTOMY (Bilateral, 05/12/2023); Gastrostomy (N/A, 05/25/2023); IR Replc Gastro/Colonic Tube Percut W/Fluoro (09/12/2023); and IR REPLACE G-TUBE SIMPLE WO FLUORO (02/27/2024).  Brief Summary:  Denise Watts is a 68 y.o. female former smoker with chronic cough.      Subjective:  Assessment of COPD No exacerbation at this time No evidence of heart failure at this time No evidence or signs of infection at this  time No respiratory distress No fevers, chills, nausea, vomiting, diarrhea No evidence of lower extremity edema No evidence hemoptysis Continue Trelegy as prescribed   Follow-up CT chest in April 2025 No significant changes from previous CT scan Follow-up CT scan ordered by Dr. Melanee Oncology    Physical Exam:   There were no vitals taken for this visit.       Physical Examination:  General Appearance: No distress  EYES EOM intact.   NECK Supple, No JVD Pulmonary: normal breath sounds, No wheezing.  CardiovascularNormal S1,S2.  No m/r/g.   Ext pulses intact, cap refill intact  ALL OTHER ROS ARE NEGATIVE      Pulmonary testing:    Chest Imaging:  CT chest 08/14/19 >> centrilobular emphysema, scarring RUL, 3 mm nodule RML, 3 mm nodule RLL, mild GGO 1.2 x 0.9 cm RLL, 4 mm nodule LUL, 1.1 x 1.2 GGO LUL, 1.2 x 1 cm nodule LLL CT chest 08/21/20 >> moderate centrilobular emphysema, stable nodules except new LLL 8 mm GGO CT chest JAN 2025-+PE, Segmental and subsegmental pulmonary emboli in the right lower  Positive for acute PE  Similar appearance of multiple pulmonary nodules Pulmonary function test 2024 March FEV1 FVC ratio 60% predicted FEV1 62% predicted Moderate obstructive lung disease  Social History:  She  reports that she quit smoking about 28 years ago. Her smoking use included cigarettes. She started smoking about 58 years ago. She has a 45 pack-year smoking history. She has been exposed to tobacco smoke. She has never used smokeless tobacco. She reports that she does not currently use alcohol after a past usage of about 4.0 standard drinks of alcohol per week. She reports that she does not use drugs.  Family History:  Her family history includes Cancer in her paternal aunt, paternal grandmother, and sister; Cancer (age of onset: 29) in her sister; Diabetes in her mother; Heart attack in her mother.     Assessment/Plan:   68 y.o. female with history of head  and neck, breast  cancer as well as adrenal carcinoma of unknown primary in the past now with non-small cell carcinoma of unknown primary predominantly with mediastinal adenopathy. She has  being treated as possible non-small cell lung cancer. She is s/p concurrent chemoradiation and here for routine follow-up , patient with diagnosis of pulmonary embolism January 2025 Segmental and subsegmental pulmonary emboli in the right lower lobe pulmonary arteries     Assessment of COPD Stable at this time No exacerbation at this time No evidence of heart failure at this time No evidence or signs of infection at this time No respiratory distress No fevers, chills, nausea, vomiting, diarrhea No evidence of lower extremity edema No evidence hemoptysis Trelegy inhaler 100 Avoid Allergens and Irritants Avoid secondhand smoke Avoid SICK contacts Recommend  Masking  when appropriate Recommend Keep up-to-date with vaccinations    Lung nodules extensive oncological history CT chest August 15, 2023 reviewed in detail Repeat CT chest April 2025 did not show any significant changes in pulmonary nodules Follow-up with oncology on SEPT 5 with follow-up CT chest pending   History of breast cancer and vocal cord cancer. - followed by Dr. Annah Watts with [facility] in [place]  Acute PE diagnosed January 2025 Patient was unaware of her diagnosis Continue anticoagulation as prescribed Patient asked for refill which I have provided  Medication List:   Allergies as of 03/11/2024       Reactions   Sulfa  Antibiotics Other (See Comments)   Mouth sore and raw        Medication List        Accurate as of March 11, 2024 11:08 AM. If you have any questions, ask your nurse or doctor.          STOP taking these medications    simethicone  80 MG chewable tablet Commonly known as: MYLICON Stopped by: Diego F Pabon       TAKE these medications    afatinib dimaleate 30  MG tablet Commonly known as: GILOTRIF Take 1 tablet (30 mg total) by mouth daily. Take on an empty stomach 1hr before or 2hrs after meals.   albuterol  108 (90 Base) MCG/ACT inhaler Commonly known as: VENTOLIN  HFA Inhale 2 puffs into the lungs every 6 (six) hours as needed for wheezing or shortness of breath.   apixaban  5 MG Tabs tablet Commonly known as: Eliquis  Take 1 tablet (5 mg total) by mouth 2 (two) times daily.   benzonatate  100 MG capsule Commonly known as: TESSALON  Take 2 capsules (200 mg total) by mouth 3 (three) times daily as needed for cough.   bisacodyl  10 MG suppository Commonly known as: DULCOLAX Place 1 suppository (10 mg total) rectally daily as needed for moderate constipation or severe constipation.   buPROPion  75 MG tablet Commonly known as: WELLBUTRIN  Take 75 mg by mouth 2 (two) times daily.   busPIRone  5 MG tablet Commonly known as: BUSPAR  Take 1 tablet (5 mg total) by mouth 3 (three) times daily as needed.   cefdinir  300 MG capsule Commonly known as: OMNICEF  Take 1 capsule (300 mg total) by mouth 2 (two) times daily.   cyclobenzaprine  5 MG tablet Commonly known as: FLEXERIL  Take 5 mg by mouth 3 (three) times daily as needed for muscle spasms.   DULoxetine  60 MG capsule Commonly known as: Cymbalta  Take 1 capsule (60 mg total) by mouth daily.   fentaNYL  12 MCG/HR Commonly known as: DURAGESIC  Place 1 patch onto the skin every 3 (three) days.   fluticasone   50 MCG/ACT nasal spray Commonly known as: FLONASE  Place 2 sprays into both nostrils daily.   free water  Soln Place 60 mLs into feeding tube 5 (five) times daily.   gabapentin  300 MG capsule Commonly known as: NEURONTIN  Take 300 mg by mouth 2 (two) times daily.   ipratropium-albuterol  0.5-2.5 (3) MG/3ML Soln Commonly known as: DUONEB Take 3 mLs by nebulization every 4 (four) hours as needed.   lactulose  (encephalopathy) 10 GM/15ML Soln Commonly known as: CHRONULAC  Place 30 mLs (20 g  total) into feeding tube 2 (two) times daily.   lidocaine  5 % Commonly known as: LIDODERM  Place 1 patch onto the skin daily. Remove & Discard patch within 12 hours or as directed by MD   meloxicam  7.5 MG tablet Commonly known as: MOBIC  Take 1 tablet (7.5 mg total) by mouth daily.   metoprolol  succinate 25 MG 24 hr tablet Commonly known as: TOPROL -XL TAKE 1 TABLET BY MOUTH DAILY   montelukast  10 MG tablet Commonly known as: SINGULAIR  TAKE 1 TABLET BY MOUTH AT BEDTIME   Nutren 1.5 Liqd Give 1 carton 4 times a day (8am, noon, 4pm and 8pm). Flush with 60ml of water  before and after each feeding.  Give additional 1 1/2 cups of water  a day via tube or drink orally for adequate hydration.   omeprazole  2 mg/mL Susp oral suspension Commonly known as: KONVOMEP  Take 20 mLs (40 mg total) by mouth daily.   ondansetron  4 MG tablet Commonly known as: Zofran  Take 1 tablet (4 mg total) by mouth every 6 (six) hours as needed for nausea or vomiting.   oxyCODONE  5 MG/5ML solution Commonly known as: ROXICODONE  Take 5 mLs (5 mg total) by mouth every 8 (eight) hours as needed for severe pain (pain score 7-10).   predniSONE  20 MG tablet Commonly known as: DELTASONE  Take 1 tablet (20 mg total) by mouth daily with breakfast. 7 days   rOPINIRole  0.5 MG tablet Commonly known as: REQUIP  Take 1 tablet (0.5 mg total) by mouth 3 (three) times daily.   rosuvastatin  40 MG tablet Commonly known as: CRESTOR  PLACE 1 TABLET (40 MG TOTAL) INTO FEEDING TUBE DAILY   senna 8.6 MG Tabs tablet Commonly known as: SENOKOT Place 1 tablet (8.6 mg total) into feeding tube daily.   sucralfate  1 GM/10ML suspension Commonly known as: CARAFATE  Place 10 mLs (1 g total) into feeding tube 4 (four) times daily.   tolterodine  4 MG 24 hr capsule Commonly known as: DETROL  LA Take 4 mg by mouth daily.   Trelegy Ellipta  100-62.5-25 MCG/ACT Aepb Generic drug: Fluticasone -Umeclidin-Vilant Inhale 1 Act into the lungs  daily.   Trelegy Ellipta  100-62.5-25 MCG/ACT Aepb Generic drug: Fluticasone -Umeclidin-Vilant Inhale 1 puff into the lungs daily.   triamcinolone  cream 0.1 % Commonly known as: KENALOG  Apply 1 Application topically 2 (two) times daily.        MEDICATION ADJUSTMENTS/LABS AND TESTS ORDERED: TRELEGY one puff once per day Please rinse mouth after every use Continue Medication for Blood Clot Eliquis  Avoid Allergens and Irritants Avoid secondhand smoke Avoid SICK contacts Recommend  Masking  when appropriate Recommend Keep up-to-date with vaccinations Follow up with Dr Melanee Continue anticoagulation for pulmonary embolism  CURRENT MEDICATIONS REVIEWED AT LENGTH WITH PATIENT TODAY   Patient  satisfied with Plan of action and management. All questions answered   Follow up 3 months   I spent a total of 42 minutes dedicated to the care of this patient on the date of this encounter to include pre-visit review  of records, face-to-face time with the patient discussing conditions above, post visit ordering of testing, clinical documentation with the electronic health record, making appropriate referrals as documented, and communicating necessary information to the patient's healthcare team.    The Patient requires high complexity decision making for assessment and support, frequent evaluation and titration of therapies, application of advanced monitoring technologies and extensive interpretation of multiple databases.  Patient satisfied with Plan of action and management. All questions answered    Nickolas Alm Cellar, M.D.  [facility]  Medical Director [facility] Medical Director [facility] Cardio-Pulmonary Department

## 2024-03-11 NOTE — Patient Instructions (Signed)
 Please continue to use oxygen  as prescribed Continue Trelegy inhaler as prescribed Please rinse mouth after use Continue with your blood thinner for your blood clot Follow-up with Dr. Melanee  Avoid Allergens and Irritants Avoid secondhand smoke Avoid SICK contacts Recommend  Masking  when appropriate Recommend Keep up-to-date with vaccinations

## 2024-03-14 ENCOUNTER — Ambulatory Visit: Admitting: Cardiology

## 2024-03-21 ENCOUNTER — Encounter: Payer: Self-pay | Admitting: Oncology

## 2024-03-22 ENCOUNTER — Other Ambulatory Visit: Payer: Self-pay | Admitting: Nurse Practitioner

## 2024-03-22 ENCOUNTER — Other Ambulatory Visit: Payer: Self-pay | Admitting: Family Medicine

## 2024-03-22 ENCOUNTER — Telehealth: Payer: Self-pay

## 2024-03-22 ENCOUNTER — Other Ambulatory Visit: Payer: Self-pay | Admitting: Oncology

## 2024-03-22 DIAGNOSIS — F418 Other specified anxiety disorders: Secondary | ICD-10-CM

## 2024-03-22 DIAGNOSIS — C3491 Malignant neoplasm of unspecified part of right bronchus or lung: Secondary | ICD-10-CM

## 2024-03-22 NOTE — Telephone Encounter (Unsigned)
 Copied from CRM #[id number]. Topic: General - Other >> Mar 22, 2024  9:18 AM Myrick T wrote: Reason for CRM: Alfonso from [facility] called to get another contact# for patient other than [phone]. Alfonso said she call that number with no answer but when she called the husbands number he yelled to never call back again. Please f/u with the refill team at [phone].

## 2024-03-23 ENCOUNTER — Other Ambulatory Visit: Payer: Self-pay | Admitting: Oncology

## 2024-03-23 MED ORDER — OXYCODONE HCL 5 MG/5ML PO SOLN: 5.0000 mg | Freq: Three times a day (TID) | ORAL | 0 refills | Status: DC | PRN

## 2024-03-24 ENCOUNTER — Telehealth: Payer: Self-pay | Admitting: Oncology

## 2024-03-24 NOTE — Telephone Encounter (Signed)
 Received request of refill of hydrocodone . Rx was refilled.

## 2024-03-25 ENCOUNTER — Encounter: Payer: Self-pay | Admitting: Cardiology

## 2024-03-25 ENCOUNTER — Encounter: Payer: Self-pay | Admitting: Oncology

## 2024-03-25 ENCOUNTER — Ambulatory Visit: Attending: Cardiology | Admitting: Cardiology

## 2024-03-25 VITALS — BP 133/80 | HR 69 | Ht 61.0 in | Wt 110.6 lb

## 2024-03-25 DIAGNOSIS — E7800 Pure hypercholesterolemia, unspecified: Secondary | ICD-10-CM

## 2024-03-25 DIAGNOSIS — I25118 Atherosclerotic heart disease of native coronary artery with other forms of angina pectoris: Secondary | ICD-10-CM

## 2024-03-25 NOTE — Patient Instructions (Signed)
 Medication Instructions:  Your physician recommends that you continue on your current medications as directed. Please refer to the Current Medication list given to you today.   *If you need a refill on your cardiac medications before your next appointment, please call your pharmacy*  Lab Work: No labs ordered today  If you have labs (blood work) drawn today and your tests are completely normal, you will receive your results only by: MyChart Message (if you have MyChart) OR A paper copy in the mail If you have any lab test that is abnormal or we need to change your treatment, we will call you to review the results.  Testing/Procedures: No test ordered today   Follow-Up: At [facility], you and your health needs are our priority.  As part of our continuing mission to provide you with exceptional heart care, our providers are all part of one team.  This team includes your primary Cardiologist (physician) and Advanced Practice Providers or APPs (Physician Assistants and Nurse Practitioners) who all work together to provide you with the care you need, when you need it.  Your next appointment:   1 year(s)  Provider:   You may see Constancia Delton, MD or one of the following Advanced Practice Providers on your designated Care Team:   Laneta Pintos, NP Gildardo Labrador, PA-C Varney Gentleman, PA-C Cadence [place], PA-C Ronald Cockayne, NP Morey Ar, NP    We recommend signing up for the patient portal called "MyChart".  Sign up information is provided on this After Visit Summary.  MyChart is used to connect with patients for Virtual Visits (Telemedicine).  Patients are able to view lab/test results, encounter notes, upcoming appointments, etc.  Non-urgent messages can be sent to your provider as well.   To learn more about what you can do with MyChart, go to [email or web address].

## 2024-03-25 NOTE — Progress Notes (Signed)
 Cardiology Office Note:    Date:  03/25/2024   ID:  Denise Watts, DOB [birth year], MRN [id number]  PCP:  [name], DO   [facility] Providers Cardiologist:  Redell Cave, MD     Referring MD: [name], DO   Chief Complaint  Patient presents with   Follow-up    12 month follow up pt has been doing well with no complaints of chest pain, chest pressure or SOB, medciation reviewed verbally with patient    History of Present Illness:    Denise Watts is a 68 y.o. 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  Has had an abdominal feeding tube placed due to difficulty swallowing.  Has a history of thyroid  cancer s/p radiation in the past.  Also recently diagnosed with bone cancer.  Diagnosed with a PE earlier this year, on Eliquis .  Overall doing okay from a cardiac perspective.  Prior notes Echo 9/24 EF 50 to 55%. Coronary CT 11/2021, mild proximal LAD and RCA stenosis. Echo./2023 EF 55 to 60% She states having chest discomfort with exertion, also endorses shortness of breath.  Her mother passed from heart attack in her 53s.   Past Medical History:  Diagnosis Date   Acute pulmonary embolism (HCC) 05/10/2023   Adenomatous colon polyp    Adrenal carcinoma, right (HCC) 12/28/2016   a.) high grade carcinoma with extensive necrosis --> s/p adrenalectomy   Anxiety    Aortic atherosclerosis    Arthritis of right knee    Atrial fibrillation and flutter (HCC)    a.) CHA2DS2-VASc = 3 (age, sex, vascular disease history) as of 02/13/2023; b.) cardiac rate/rhythm maintained intrinsically without pharmacological intervention; no chronic OAC (does take low dose ASA)   B12 deficiency    Breast cancer (HCC) 1997   a.) stage IB (cT1a, cN0, cM0, G3, ER+, PR-, HER2-); s/p lumpectomy + XRT   COPD (chronic  obstructive pulmonary disease) (HCC)    Coronary artery disease    a.) cCTA 12/09/2021: Ca2+ = 135 (80th %'ile for age/sex/race match contol) --> 25-49% pLAD and RCA distributions   Depression    Diastolic dysfunction    a.) TTE 12/11/2020, no RWMAs, norm RVSF, G1DD; b.) TTE 12/14/2021: EF 55-60, no RWMAs, norm RVSF, G1DD; c.) TTE 01/27/2023: EF 50-55%, no RWMAs, G1DD, mild RAE, mild MR, mod TR   Dry cough 03/02/2017   Dyspnea    Full dentures    GERD (gastroesophageal reflux disease)    History of hiatal hernia    HLD (hyperlipidemia)    Invasive carcinoma of breast (HCC) 11/15/2017   a.) path (+) for G3, ER -, PR -, HER2/neu - ; s/p lumpectomy 01/17/2018 + 4 cycles TC systemic chemotherapy + XRT   Long term current use of aspirin     Personal history of chemotherapy    Personal history of radiation therapy    Pneumothorax of left lung after biopsy 01/25/2023   a.) hypoxic in PACU requiring re-intubation --> transferred to ICU --> chest tube placed by PCCM   RLS (restless legs syndrome)    a.) on ropinirole    Skin cancer    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   a.) Stage IB (cT1a, cN0, cM0, G3, ER+, PR-, HER2-)    Past Surgical History:  Procedure Laterality Date  BIOPSY  01/05/2023   Procedure: BIOPSY;  Surgeon: Unk Corinn Skiff, MD;  Location: [facility] ENDOSCOPY;  Service: Gastroenterology;;   BREAST BIOPSY Right 1997   positive   BREAST BIOPSY Right 11/15/2017   INVASIVE MAMMARY CARCINOMA triple negative   BREAST BIOPSY Right 07/20/2020   us  bx, Q marker, neg   BREAST LUMPECTOMY Right 1997   2019 also   BREAST LUMPECTOMY WITH SENTINEL LYMPH NODE BIOPSY Right 12/08/2017   Procedure: BREAST LUMPECTOMY WITH SENTINEL LYMPH NODE BX;  Surgeon: Dessa Reyes ORN, MD;  Location: [facility] ORS;  Service: General;  Laterality: Right;   BREAST REDUCTION WITH MASTOPEXY Left 06/10/2019   Procedure: Left breast mastopexy reduction for symmetry;  Surgeon: Lowery Estefana RAMAN, DO;   Location: [facility];  Service: Plastics;  Laterality: Left;  total 2.5 hours   CATARACT EXTRACTION W/PHACO Right 02/07/2022   Procedure: CATARACT EXTRACTION PHACO AND INTRAOCULAR LENS PLACEMENT (IOC) RIGHT DIABETIC;  Surgeon: Myrna Adine Anes, MD;  Location: [facility];  Service: Ophthalmology;  Laterality: Right;  3.60 00:34.7   CATARACT EXTRACTION W/PHACO Left 02/28/2022   Procedure: CATARACT EXTRACTION PHACO AND INTRAOCULAR LENS PLACEMENT (IOC) LEFT DIABETIC 4.11 00:26.1;  Surgeon: Myrna Adine Anes, MD;  Location: [facility];  Service: Ophthalmology;  Laterality: Left;   COLONOSCOPY  2015   COLONOSCOPY N/A 01/05/2023   Procedure: COLONOSCOPY;  Surgeon: Unk Corinn Skiff, MD;  Location: [facility] ENDOSCOPY;  Service: Gastroenterology;  Laterality: N/A;   COLONOSCOPY WITH PROPOFOL  N/A 10/29/2019   Procedure: COLONOSCOPY WITH PROPOFOL ;  Surgeon: Jinny Carmine, MD;  Location: [facility] ENDOSCOPY;  Service: Endoscopy;  Laterality: N/A;   ESOPHAGOGASTRODUODENOSCOPY (EGD) WITH PROPOFOL  N/A 05/24/2019   Procedure: ESOPHAGOGASTRODUODENOSCOPY (EGD) WITH PROPOFOL ;  Surgeon: Jinny Carmine, MD;  Location: [facility] ENDOSCOPY;  Service: Endoscopy;  Laterality: N/A;   ESOPHAGOGASTRODUODENOSCOPY (EGD) WITH PROPOFOL  N/A 09/06/2022   Procedure: ESOPHAGOGASTRODUODENOSCOPY (EGD) WITH PROPOFOL ;  Surgeon: Jinny Carmine, MD;  Location: [facility] ENDOSCOPY;  Service: Endoscopy;  Laterality: N/A;   ESOPHAGOGASTRODUODENOSCOPY (EGD) WITH PROPOFOL   01/05/2023   Procedure: ESOPHAGOGASTRODUODENOSCOPY (EGD) WITH PROPOFOL ;  Surgeon: Unk Corinn Skiff, MD;  Location: [facility] ENDOSCOPY;  Service: Gastroenterology;;   ESOPHAGUS SURGERY     GASTROSTOMY N/A 05/25/2023   Procedure: OPEN INSERTION OF GASTROSTOMY TUBE;  Surgeon: Jordis Laneta FALCON, MD;  Location: [facility]S;  Service: General;  Laterality: N/A;   IR IMAGING GUIDED PORT INSERTION  02/07/2023   IR REPLACE G-TUBE SIMPLE WO FLUORO  02/27/2024   IR REPLC  GASTRO/COLONIC TUBE PERCUT W/FLUORO  09/12/2023   KNEE SURGERY Right    LIPOSUCTION WITH LIPOFILLING Right 06/10/2019   Procedure: right breast scar and capsule release with fat grafting;  Surgeon: Lowery Estefana RAMAN, DO;  Location: [facility];  Service: Plastics;  Laterality: Right;   POLYPECTOMY  01/05/2023   Procedure: POLYPECTOMY;  Surgeon: Unk Corinn Skiff, MD;  Location: [facility] ENDOSCOPY;  Service: Gastroenterology;;   [facility] REMOVAL     PORTACATH PLACEMENT Right 01/17/2018   Procedure: INSERTION PORT-A-CATH- RIGHT;  Surgeon: Dessa Reyes ORN, MD;  Location: [facility];  Service: General;  Laterality: Right;   PULMONARY THROMBECTOMY Bilateral 05/12/2023   Procedure: PULMONARY THROMBECTOMY;  Surgeon: Marea Selinda RAMAN, MD;  Location: [facility];  Service: Cardiovascular;  Laterality: Bilateral;   ROBOTIC ADRENALECTOMY Right 12/28/2016   Procedure: ROBOTIC ADRENALECTOMY;  Surgeon: Penne Knee, MD;  Location: [facility];  Service: Urology;  Laterality: Right;   SKIN CANCER EXCISION     THROAT SURGERY  1998  throat cancer    TUBAL LIGATION     VENTRAL HERNIA REPAIR N/A 03/03/2017   Procedure: HERNIA REPAIR VENTRAL ADULT;  Surgeon: Shelva Dunnings, MD;  Location: [facility];  Service: General;  Laterality: N/A;   VIDEO BRONCHOSCOPY WITH ENDOBRONCHIAL ULTRASOUND N/A 01/25/2023   Procedure: VIDEO BRONCHOSCOPY WITH ENDOBRONCHIAL ULTRASOUND;  Surgeon: Parris Manna, MD;  Location: [facility];  Service: Thoracic;  Laterality: N/A;   VIDEO BRONCHOSCOPY WITH ENDOBRONCHIAL ULTRASOUND N/A 02/17/2023   Procedure: VIDEO BRONCHOSCOPY WITH ENDOBRONCHIAL ULTRASOUND;  Surgeon: Parris Manna, MD;  Location: [facility];  Service: Thoracic;  Laterality: N/A;    Current Medications: Current Meds  Medication Sig   albuterol  (VENTOLIN  HFA) 108 (90 Base) MCG/ACT inhaler Inhale 2 puffs into the lungs every 6 (six) hours as needed for wheezing or shortness of breath.   apixaban   (ELIQUIS ) 5 MG TABS tablet Take 1 tablet (5 mg total) by mouth 2 (two) times daily.   benzonatate  (TESSALON ) 100 MG capsule Take 2 capsules (200 mg total) by mouth 3 (three) times daily as needed for cough.   bisacodyl  (DULCOLAX) 10 MG suppository Place 1 suppository (10 mg total) rectally daily as needed for moderate constipation or severe constipation.   buPROPion  (WELLBUTRIN ) 75 MG tablet Take 75 mg by mouth 2 (two) times daily.   busPIRone  (BUSPAR ) 5 MG tablet Take 1 tablet (5 mg total) by mouth 3 (three) times daily as needed.   cefdinir  (OMNICEF ) 300 MG capsule Take 1 capsule (300 mg total) by mouth 2 (two) times daily.   cyclobenzaprine  (FLEXERIL ) 5 MG tablet Take 5 mg by mouth 3 (three) times daily as needed for muscle spasms.   DULoxetine  (CYMBALTA ) 60 MG capsule TAKE 1 CAPSULE BY MOUTH DAILY   fluticasone  (FLONASE ) 50 MCG/ACT nasal spray Place 2 sprays into both nostrils daily.   Fluticasone -Umeclidin-Vilant (TRELEGY ELLIPTA ) 100-62.5-25 MCG/ACT AEPB Inhale 1 Act into the lungs daily.   Fluticasone -Umeclidin-Vilant (TRELEGY ELLIPTA ) 100-62.5-25 MCG/ACT AEPB Inhale 1 puff into the lungs daily.   gabapentin  (NEURONTIN ) 300 MG capsule Take 300 mg by mouth 2 (two) times daily.   GILOTRIF  30 MG tablet TAKE 1 TABLET DAILY ON AN EMPTY STOMACH AT LEAST 1 HOUR BEFORE OR 2 HOURS AFTER MEALS   ipratropium-albuterol  (DUONEB) 0.5-2.5 (3) MG/3ML SOLN Take 3 mLs by nebulization every 4 (four) hours as needed.   lactulose , encephalopathy, (CHRONULAC ) 10 GM/15ML SOLN Place 30 mLs (20 g total) into feeding tube 2 (two) times daily.   lidocaine  (LIDODERM ) 5 % Place 1 patch onto the skin daily. Remove & Discard patch within 12 hours or as directed by MD   meloxicam  (MOBIC ) 7.5 MG tablet Take 1 tablet (7.5 mg total) by mouth daily.   metoprolol  succinate (TOPROL -XL) 25 MG 24 hr tablet TAKE 1 TABLET BY MOUTH DAILY   montelukast  (SINGULAIR ) 10 MG tablet TAKE 1 TABLET BY MOUTH AT BEDTIME   Nutritional  Supplements (NUTREN 1.5) LIQD Give 1 carton 4 times a day (8am, noon, 4pm and 8pm). Flush with 60ml of water  before and after each feeding.  Give additional 1 1/2 cups of water  a day via tube or drink orally for adequate hydration.   omeprazole  (KONVOMEP ) 2 mg/mL SUSP oral suspension Take 20 mLs (40 mg total) by mouth daily.   ondansetron  (ZOFRAN ) 4 MG tablet Take 1 tablet (4 mg total) by mouth every 6 (six) hours as needed for nausea or vomiting.   oxyCODONE  (ROXICODONE ) 5 MG/5ML solution Take 5 mLs (5 mg total) by mouth every  8 (eight) hours as needed for severe pain (pain score 7-10).   predniSONE  (DELTASONE ) 20 MG tablet Take 1 tablet (20 mg total) by mouth daily with breakfast. 7 days   rOPINIRole  (REQUIP ) 0.5 MG tablet Take 1 tablet (0.5 mg total) by mouth 3 (three) times daily.   rosuvastatin  (CRESTOR ) 40 MG tablet PLACE 1 TABLET (40 MG TOTAL) INTO FEEDING TUBE DAILY   senna (SENOKOT) 8.6 MG TABS tablet Place 1 tablet (8.6 mg total) into feeding tube daily.   sucralfate  (CARAFATE ) 1 GM/10ML suspension Place 10 mLs (1 g total) into feeding tube 4 (four) times daily.   tolterodine  (DETROL  LA) 4 MG 24 hr capsule Take 4 mg by mouth daily.   triamcinolone  cream (KENALOG ) 0.1 % Apply 1 Application topically 2 (two) times daily.   Water  For Irrigation, Sterile (FREE WATER ) SOLN Place 60 mLs into feeding tube 5 (five) times daily.   [DISCONTINUED] fentaNYL  (DURAGESIC ) 12 MCG/HR Place 1 patch onto the skin every 3 (three) days.     Allergies:   Sulfa  antibiotics   Social History   Socioeconomic History   Marital status: Married    Spouse name: Corey, Caulfield (Spouse) [phone] ([place])   Number of children: 4   Years of education: Not on file   Highest education level: 10th grade  Occupational History   Occupation: retired  Tobacco Use   Smoking status: Former    Current packs/day: 0.00    Average packs/day: 1.5 packs/day for 30.0 years (45.0 ttl pk-yrs)    Types: Cigarettes    Start  date: 05/02/1965    Quit date: 05/03/1995    Years since quitting: 28.9    Passive exposure: Past   Smokeless tobacco: Never  Vaping Use   Vaping status: Never Used  Substance and Sexual Activity   Alcohol use: Not Currently    Alcohol/week: 4.0 standard drinks of alcohol    Types: 4 Shots of liquor per week    Comment: none   Drug use: No   Sexual activity: Not Currently    Birth control/protection: Post-menopausal  Other Topics Concern   Not on file  Social History Narrative   Patient from home with husband.   Social Drivers of Corporate Investment Banker Strain: Low Risk  (12/06/2023)   Overall Financial Resource Strain (CARDIA)    Difficulty of Paying Living Expenses: Not hard at all  Food Insecurity: No Food Insecurity (12/06/2023)   Hunger Vital Sign    Worried About Running Out of Food in the Last Year: Never true    Ran Out of Food in the Last Year: Never true  Transportation Needs: No Transportation Needs (12/06/2023)   PRAPARE - Administrator, Civil Service (Medical): No    Lack of Transportation (Non-Medical): No  Physical Activity: Insufficiently Active (12/06/2023)   Exercise Vital Sign    Days of Exercise per Week: 3 days    Minutes of Exercise per Session: 30 min  Stress: No Stress Concern Present (12/06/2023)   [employer] of Occupational Health - Occupational Stress Questionnaire    Feeling of Stress: Only a little  Social Connections: Moderately Isolated (12/06/2023)   Social Connection and Isolation Panel    Frequency of Communication with Friends and Family: More than three times a week    Frequency of Social Gatherings with Friends and Family: Never    Attends Religious Services: Never    Database Administrator or Organizations: No    Attends Banker  Meetings: Never    Marital Status: Married     Family History: The patient's family history includes Cancer in her paternal aunt, paternal grandmother, and sister; Cancer (age of  onset: 31) in her sister; Diabetes in her mother; Heart attack in her mother. There is no history of Prostate cancer, Kidney cancer, Bladder Cancer, or Breast cancer.  ROS:   Please see the history of present illness.     All other systems reviewed and are negative.  EKGs/Labs/Other Studies Reviewed:    The following studies were reviewed today:   EKG Interpretation Date/Time:  Monday March 25 2024 11:43:33 EST Ventricular Rate:  69 PR Interval:  156 QRS Duration:  88 QT Interval:  434 QTC Calculation: 465 R Axis:   -2  Text Interpretation: Normal sinus rhythm Normal ECG Confirmed by Darliss Rogue ([id number]) on 03/25/2024 12:03:58 PM    Recent Labs: 05/24/2023: B Natriuretic Peptide 73.7 06/01/2023: Magnesium  2.3 02/26/2024: ALT 87; BUN 22; Creatinine 0.75; Hemoglobin 12.7; Platelet Count 172; Potassium 4.0; Sodium 137  Recent Lipid Panel    Component Value Date/Time   CHOL 135 05/26/2021 1101   TRIG 97 01/26/2023 0511   HDL 46 05/26/2021 1101   CHOLHDL 2.9 05/26/2021 1101   LDLCALC 68 05/26/2021 1101     Risk Assessment/Calculations:          Physical Exam:    VS:  BP 133/80 (BP Location: Left Arm, Patient Position: Sitting, Cuff Size: Normal)   Pulse 69   Ht 5' 1 (1.549 m)   Wt 110 lb 9.6 oz (50.2 kg)   SpO2 97%   BMI 20.90 kg/m     Wt Readings from Last 3 Encounters:  03/25/24 110 lb 9.6 oz (50.2 kg)  03/11/24 109 lb 6.4 oz (49.6 kg)  03/11/24 109 lb (49.4 kg)     GEN:  Well nourished, well developed in no acute distress HEENT: Normal NECK: No JVD; No carotid bruits CARDIAC: RRR, no murmurs, rubs, gallops RESPIRATORY: Diminished breath sound bilaterally, no wheezing. ABDOMEN: Soft, non-tender, non-distended MUSCULOSKELETAL:  No edema; No deformity  SKIN: Warm and dry NEUROLOGIC:  Alert and oriented x 3 PSYCHIATRIC:  Normal affect   ASSESSMENT:    1. Coronary artery disease of native artery of native heart with stable angina pectoris   2.  Pure hypercholesterolemia    PLAN:    In order of problems listed above:  Mild Nonobstructive CAD involving the proximal LAD and RCA , calcium  score 135.  Continue Crestor  40 mg daily.  EF 55%.  On Eliquis  due to PE. Hyperlipidemia, LDL at goal.  Continue Crestor .   Follow-up in 12 months.  Medication Adjustments/Labs and Tests Ordered: Current medicines are reviewed at length with the patient today.  Concerns regarding medicines are outlined above.  Orders Placed This Encounter  Procedures   EKG 12-Lead     No orders of the defined types were placed in this encounter.     Patient Instructions  Medication Instructions:  Your physician recommends that you continue on your current medications as directed. Please refer to the Current Medication list given to you today.   *If you need a refill on your cardiac medications before your next appointment, please call your pharmacy*  Lab Work: No labs ordered today  If you have labs (blood work) drawn today and your tests are completely normal, you will receive your results only by: MyChart Message (if you have MyChart) OR A paper copy in the mail If you have  any lab test that is abnormal or we need to change your treatment, we will call you to review the results.  Testing/Procedures: No test ordered today   Follow-Up: At [facility], you and your health needs are our priority.  As part of our continuing mission to provide you with exceptional heart care, our providers are all part of one team.  This team includes your primary Cardiologist (physician) and Advanced Practice Providers or APPs (Physician Assistants and Nurse Practitioners) who all work together to provide you with the care you need, when you need it.  Your next appointment:   1 year(s)  Provider:   You may see Redell Cave, MD or one of the following Advanced Practice Providers on your designated Care Team:   Lonni Meager, NP Lesley Maffucci,  PA-C Bernardino Bring, PA-C Cadence [place], PA-C Tylene Lunch, NP Barnie Hila, NP    We recommend signing up for the patient portal called MyChart.  Sign up information is provided on this After Visit Summary.  MyChart is used to connect with patients for Virtual Visits (Telemedicine).  Patients are able to view lab/test results, encounter notes, upcoming appointments, etc.  Non-urgent messages can be sent to your provider as well.   To learn more about what you can do with MyChart, go to [email or web address].              Signed, Redell Cave, MD  03/25/2024 12:53 PM    [facility]

## 2024-03-25 NOTE — Telephone Encounter (Signed)
 Advised patient of the message we received and provided their contact information. She will give them a call.

## 2024-04-01 ENCOUNTER — Emergency Department
Admission: EM | Admit: 2024-04-01 | Discharge: 2024-04-01 | Disposition: A | Discharge status: Home / Self Care | Attending: Emergency Medicine | Admitting: Emergency Medicine

## 2024-04-01 ENCOUNTER — Ambulatory Visit: Payer: Self-pay

## 2024-04-01 ENCOUNTER — Emergency Department

## 2024-04-01 ENCOUNTER — Other Ambulatory Visit: Payer: Self-pay

## 2024-04-01 DIAGNOSIS — I4891 Unspecified atrial fibrillation: Secondary | ICD-10-CM | POA: Insufficient documentation

## 2024-04-01 DIAGNOSIS — R918 Other nonspecific abnormal finding of lung field: Secondary | ICD-10-CM | POA: Insufficient documentation

## 2024-04-01 DIAGNOSIS — R059 Cough, unspecified: Secondary | ICD-10-CM | POA: Diagnosis present

## 2024-04-01 DIAGNOSIS — E860 Dehydration: Secondary | ICD-10-CM | POA: Insufficient documentation

## 2024-04-01 DIAGNOSIS — Z7901 Long term (current) use of anticoagulants: Secondary | ICD-10-CM | POA: Diagnosis not present

## 2024-04-01 DIAGNOSIS — J101 Influenza due to other identified influenza virus with other respiratory manifestations: Secondary | ICD-10-CM | POA: Diagnosis not present

## 2024-04-01 DIAGNOSIS — Z853 Personal history of malignant neoplasm of breast: Secondary | ICD-10-CM | POA: Insufficient documentation

## 2024-04-01 DIAGNOSIS — R0602 Shortness of breath: Secondary | ICD-10-CM | POA: Insufficient documentation

## 2024-04-01 DIAGNOSIS — C3490 Malignant neoplasm of unspecified part of unspecified bronchus or lung: Secondary | ICD-10-CM | POA: Diagnosis not present

## 2024-04-01 DIAGNOSIS — R0981 Nasal congestion: Secondary | ICD-10-CM | POA: Insufficient documentation

## 2024-04-01 DIAGNOSIS — I700 Atherosclerosis of aorta: Secondary | ICD-10-CM | POA: Diagnosis not present

## 2024-04-01 DIAGNOSIS — J111 Influenza due to unidentified influenza virus with other respiratory manifestations: Secondary | ICD-10-CM

## 2024-04-01 DIAGNOSIS — Z85828 Personal history of other malignant neoplasm of skin: Secondary | ICD-10-CM | POA: Diagnosis not present

## 2024-04-01 DIAGNOSIS — J449 Chronic obstructive pulmonary disease, unspecified: Secondary | ICD-10-CM | POA: Insufficient documentation

## 2024-04-01 DIAGNOSIS — I2510 Atherosclerotic heart disease of native coronary artery without angina pectoris: Secondary | ICD-10-CM | POA: Insufficient documentation

## 2024-04-01 DIAGNOSIS — I517 Cardiomegaly: Secondary | ICD-10-CM | POA: Insufficient documentation

## 2024-04-01 DIAGNOSIS — S2242XA Multiple fractures of ribs, left side, initial encounter for closed fracture: Secondary | ICD-10-CM | POA: Diagnosis not present

## 2024-04-01 LAB — BASIC METABOLIC PANEL WITH GFR
Anion gap: 11 (ref 5–15)
BUN: 14 mg/dL (ref 8–23)
CO2: 25 mmol/L (ref 22–32)
Calcium: 9 mg/dL (ref 8.9–10.3)
Chloride: 103 mmol/L (ref 98–111)
Creatinine, Ser: 0.7 mg/dL (ref 0.44–1.00)
GFR, Estimated: >60 mL/min (ref >60)
Glucose, Bld: 129 mg/dL — ABNORMAL HIGH (ref 70–99)
Potassium: 4.1 mmol/L (ref 3.5–5.1)
Sodium: 139 mmol/L (ref 135–145)

## 2024-04-01 LAB — CBC
HCT: 43 % (ref 36.0–46.0)
Hemoglobin: 13.9 g/dL (ref 12.0–15.0)
MCH: 32 pg (ref 26.0–34.0)
MCHC: 32.3 g/dL (ref 30.0–36.0)
MCV: 99.1 fL (ref 80.0–100.0)
Platelets: 174 K/uL (ref 150–400)
RBC: 4.34 MIL/uL (ref 3.87–5.11)
RDW: 12.8 % (ref 11.5–15.5)
WBC: 8.6 K/uL (ref 4.0–10.5)
nRBC: 0 % (ref 0.0–0.2)

## 2024-04-01 LAB — RESP PANEL BY RT-PCR (RSV, FLU A&B, COVID)  RVPGX2
Influenza A by PCR: NEGATIVE
Influenza B by PCR: NEGATIVE
Resp Syncytial Virus by PCR: NEGATIVE
SARS Coronavirus 2 by RT PCR: NEGATIVE

## 2024-04-01 LAB — TROPONIN T, HIGH SENSITIVITY
Troponin T High Sensitivity: <15 ng/L (ref 0–19)
Troponin T High Sensitivity: <15 ng/L (ref 0–19)

## 2024-04-01 MED ORDER — DOXYCYCLINE HYCLATE 100 MG PO TABS
100.0000 mg | ORAL_TABLET | Freq: Two times a day (BID) | ORAL | Status: DC
  Administered 2024-04-01: 100 mg
  Filled 2024-04-01: qty 1

## 2024-04-01 MED ORDER — PANTOPRAZOLE SODIUM 40 MG IV SOLR
40.0000 mg | Freq: Once | INTRAVENOUS | Status: AC
  Administered 2024-04-01: 40 mg via INTRAVENOUS
  Filled 2024-04-01: qty 10

## 2024-04-01 MED ORDER — GUAIFENESIN 100 MG/5ML PO LIQD
15.0000 mL | Freq: Once | ORAL | Status: AC
  Administered 2024-04-01: 15 mL
  Filled 2024-04-01: qty 20

## 2024-04-01 MED ORDER — GUAIFENESIN 100 MG/5ML PO LIQD: 10.0000 mL | Freq: Two times a day (BID) | ORAL | 0 refills | Status: AC

## 2024-04-01 MED ORDER — ONDANSETRON HCL 4 MG/2ML IJ SOLN
4.0000 mg | Freq: Once | INTRAMUSCULAR | Status: AC
  Administered 2024-04-01: 4 mg via INTRAVENOUS
  Filled 2024-04-01: qty 2

## 2024-04-01 MED ORDER — ACETAMINOPHEN 325 MG PO TABS
650.0000 mg | ORAL_TABLET | Freq: Once | ORAL | Status: DC
  Filled 2024-04-01: qty 2

## 2024-04-01 MED ORDER — ACETAMINOPHEN 160 MG/5ML PO SOLN
650.0000 mg | Freq: Once | ORAL | Status: AC
  Administered 2024-04-01: 650 mg
  Filled 2024-04-01: qty 20.3

## 2024-04-01 MED ORDER — DOXYCYCLINE MONOHYDRATE 100 MG PO CAPS: 100.0000 mg | ORAL_CAPSULE | Freq: Two times a day (BID) | ORAL | 0 refills | Status: AC

## 2024-04-01 MED ORDER — LACTATED RINGERS IV BOLUS
1000.0000 mL | Freq: Once | INTRAVENOUS | Status: AC
  Administered 2024-04-01: 1000 mL via INTRAVENOUS

## 2024-04-01 MED ORDER — ONDANSETRON 4 MG PO TBDP: 4.0000 mg | ORAL_TABLET | Freq: Three times a day (TID) | ORAL | 0 refills | Status: AC | PRN

## 2024-04-01 MED ORDER — DOXYCYCLINE MONOHYDRATE 25 MG/5ML PO SUSR
100.0000 mg | Freq: Two times a day (BID) | ORAL | Status: DC
  Filled 2024-04-01: qty 20

## 2024-04-01 NOTE — Telephone Encounter (Signed)
 Created in error

## 2024-04-01 NOTE — ED Triage Notes (Signed)
 Pt presents to the ED via POV from home with congestions and SHOB x3 days. Pt reports hx of COPD. Pt reports chest pain from coughing. States that she has been coughing up green stuff.

## 2024-04-01 NOTE — Telephone Encounter (Signed)
 FYI Only or Action Required?: FYI only for provider: ED advised.  Patient was last seen in primary care on 07/21/2023 by [name], DO.  Called Nurse Triage reporting Shortness of Breath.  Symptoms began several days ago.  Interventions attempted: Nothing.  Symptoms are: gradually worsening.  Triage Disposition: Go to ED Now (Notify PCP)  Patient/caregiver understands and will follow disposition?: Yes Reason for Disposition  [1] MODERATE difficulty breathing (e.g., speaks in phrases, SOB even at rest, pulse 100-120) AND [2] NEW-onset or WORSE than normal  Answer Assessment - Initial Assessment Questions Patient states she has oxygen  in the home but has not used it in a while. Has not been using nebulizer or inhaler. Advised ED, patient states her husband will drive her.   1. RESPIRATORY STATUS: Describe your breathing? (e.g., wheezing, shortness of breath, unable to speak, severe coughing)      Severe coughing, gets dizzy when up and walking due to head cold, wheezing some.   2. ONSET: When did this breathing problem begin?      3 days ago  3. PATTERN Does the difficult breathing come and go, or has it been constant since it started?      Constant since it stated  4. CARDIAC HISTORY: Do you have any history of heart disease? (e.g., heart attack, angina, bypass surgery, angioplasty)      Unsure  5. LUNG HISTORY: Do you have any history of lung disease?  (e.g., pulmonary embolus, asthma, emphysema)     COPD  6. CAUSE: What do you think is causing the breathing problem?      Head cold  7. OTHER SYMPTOMS: Do you have any other symptoms? (e.g., chest pain, cough, dizziness, fever, runny nose)     Bad head cold, cough, not really productive, sore throat, chest pain from coughing and and sinus congestion  8. O2 SATURATION MONITOR:  Do you use an oxygen  saturation monitor (pulse oximeter) at home? If Yes, ask: What is your reading (oxygen  level) today? What  is your usual oxygen  saturation reading? (e.g., 95%)       Unsure, does not have pulse ox  Protocols used: Breathing Difficulty-A-AH  Copied from CRM [id number]. Topic: Clinical - Red Word Triage >> Apr 01, 2024 11:40 AM Denise Watts wrote: [facility] that prompted transfer to Nurse Triage: Patient states for the past three days has had a bad cough, sore throat, difficulty breathing, pain and pressure in her head. Symptoms have gotten worse each day.

## 2024-04-01 NOTE — ED Provider Notes (Signed)
 [facility] Provider Note    Event Date/Time   First MD Initiated Contact with Patient 04/01/24 1645     (approximate)   History   Chief Complaint: Shortness of Breath   HPI  Denise Watts is a 68 y.o. female with a history of GERD, lung cancer, atrial fibrillation on Eliquis  who comes ED complaining of generalized headache, fatigue, chills, runny nose and nasal congestion, and nonproductive cough, gradual onset and worsening for the past 3 days.  Associated loss of appetite.  Has diarrhea as well, no vomiting.  Denies chest pain.        Past Medical History:  Diagnosis Date   Acute pulmonary embolism (HCC) 05/10/2023   Adenomatous colon polyp    Adrenal carcinoma, right (HCC) 12/28/2016   a.) high grade carcinoma with extensive necrosis --> s/p adrenalectomy   Anxiety    Aortic atherosclerosis    Arthritis of right knee    Atrial fibrillation and flutter (HCC)    a.) CHA2DS2-VASc = 3 (age, sex, vascular disease history) as of 02/13/2023; b.) cardiac rate/rhythm maintained intrinsically without pharmacological intervention; no chronic OAC (does take low dose ASA)   B12 deficiency    Breast cancer (HCC) 1997   a.) stage IB (cT1a, cN0, cM0, G3, ER+, PR-, HER2-); s/p lumpectomy + XRT   COPD (chronic obstructive pulmonary disease) (HCC)    Coronary artery disease    a.) cCTA 12/09/2021: Ca2+ = 135 (80th %'ile for age/sex/race match contol) --> 25-49% pLAD and RCA distributions   Depression    Diastolic dysfunction    a.) TTE 12/11/2020, no RWMAs, norm RVSF, G1DD; b.) TTE 12/14/2021: EF 55-60, no RWMAs, norm RVSF, G1DD; c.) TTE 01/27/2023: EF 50-55%, no RWMAs, G1DD, mild RAE, mild MR, mod TR   Dry cough 03/02/2017   Dyspnea    Full dentures    GERD (gastroesophageal reflux disease)    History of hiatal hernia    HLD (hyperlipidemia)    Invasive carcinoma of breast (HCC) 11/15/2017   a.) path (+) for G3, ER -, PR -, HER2/neu - ; s/p lumpectomy  01/17/2018 + 4 cycles TC systemic chemotherapy + XRT   Long term current use of aspirin     Personal history of chemotherapy    Personal history of radiation therapy    Pneumothorax of left lung after biopsy 01/25/2023   a.) hypoxic in PACU requiring re-intubation --> transferred to ICU --> chest tube placed by PCCM   RLS (restless legs syndrome)    a.) on ropinirole    Skin cancer    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   a.) Stage IB (cT1a, cN0, cM0, G3, ER+, PR-, HER2-)    Current Outpatient Rx   Order #: [id number] Class: Normal   Order #: [id number] Class: Print   Order #: [id number] Class: Normal   Order #: [id number] Class: Normal   Order #: [id number] Class: Normal   Order #: [id number] Class: Normal   Order #: [id number] Class: No Print   Order #: [id number] Class: No Print   Order #: [id number] Class: Historical Med   Order #: [id number] Class: Normal   Order #: [id number] Class: Normal   Order #: [id number] Class: Historical Med   Order #: [id number] Class: Normal   Order #: [id number] Class: Historical Med   Order #: [id number] Class: Normal   Order #: [id number] Class: Sample   Order #: [id number] Class: Historical Med   Order #: [id number] Class: Normal   Order #: [id number] Class: Historical Med   Order #: [id number] Class: Normal   Order #:  [id number] Class: Normal   Order #: [id number] Class: Normal   Order #: [id number] Class: Normal   Order #: [id number] Class: Normal   Order #: [id number] Class: No Print   Order #: [id number] Class: Normal   Order #: [id number] Class: No Print   Order #: [id number] Class: Normal   Order #: [id number] Class: Normal   Order #: [id number] Class: Normal   Order #: [id number] Class: Normal   Order #: [id number] Class: No Print   Order #: [id number] Class: Normal   Order #: [id number] Class: Historical Med   Order #: [id number] Class: Historical Med   Order #: [id number] Class: No Print    Past Surgical History:  Procedure Laterality Date   BIOPSY  01/05/2023   Procedure:  BIOPSY;  Surgeon: Unk Corinn Skiff, MD;  Location: [facility] ENDOSCOPY;  Service: Gastroenterology;;   BREAST BIOPSY Right 1997   positive   BREAST BIOPSY Right 11/15/2017   INVASIVE MAMMARY CARCINOMA triple negative   BREAST BIOPSY Right 07/20/2020   us  bx, Q marker, neg   BREAST LUMPECTOMY Right 1997   2019 also   BREAST LUMPECTOMY WITH SENTINEL LYMPH NODE BIOPSY Right 12/08/2017   Procedure: BREAST LUMPECTOMY WITH SENTINEL LYMPH NODE BX;  Surgeon: Dessa Reyes ORN, MD;  Location: [facility];  Service: General;  Laterality: Right;   BREAST REDUCTION WITH MASTOPEXY Left 06/10/2019   Procedure: Left breast mastopexy reduction for symmetry;  Surgeon: Lowery Estefana RAMAN, DO;  Location: [facility];  Service: Plastics;  Laterality: Left;  total 2.5 hours   CATARACT EXTRACTION W/PHACO Right 02/07/2022   Procedure: CATARACT EXTRACTION PHACO AND INTRAOCULAR LENS PLACEMENT (IOC) RIGHT DIABETIC;  Surgeon: Myrna Adine Anes, MD;  Location: [facility];  Service: Ophthalmology;  Laterality: Right;  3.60 00:34.7   CATARACT EXTRACTION W/PHACO Left 02/28/2022   Procedure: CATARACT EXTRACTION PHACO AND INTRAOCULAR LENS PLACEMENT (IOC) LEFT DIABETIC 4.11 00:26.1;  Surgeon: Myrna Adine Anes, MD;  Location: [facility];  Service: Ophthalmology;  Laterality: Left;   COLONOSCOPY  2015   COLONOSCOPY N/A 01/05/2023   Procedure: COLONOSCOPY;  Surgeon: Unk Corinn Skiff, MD;  Location: [facility] ENDOSCOPY;  Service: Gastroenterology;  Laterality: N/A;   COLONOSCOPY WITH PROPOFOL  N/A 10/29/2019   Procedure: COLONOSCOPY WITH PROPOFOL ;  Surgeon: Jinny Carmine, MD;  Location: [facility] END[facility];  Service: Endoscopy;  Laterality: N/A;   ESOPHAGOGASTRODUODENOSCOPY (EGD) WITH PROPOFOL  N/A 05/24/2019   Procedure: ESOPHAGOGASTRODUODENOSCOPY (EGD) WITH PROPOFOL ;  Surgeon: Jinny Carmine, MD;  Location: [facility];  Service: Endoscopy;  Laterality: N/A;   ESOPHAGOGASTRODUODENOSCOPY (EGD) WITH PROPOFOL   N/A 09/06/2022   Procedure: ESOPHAGOGASTRODUODENOSCOPY (EGD) WITH PROPOFOL ;  Surgeon: Jinny Carmine, MD;  Location: [facility];  Service: Endoscopy;  Laterality: N/A;   ESOPHAGOGASTRODUODENOSCOPY (EGD) WITH PROPOFOL   01/05/2023   Procedure: ESOPHAGOGASTRODUODENOSCOPY (EGD) WITH PROPOFOL ;  Surgeon: Unk Corinn Skiff, MD;  Location: [facility];  Service: Gastroenterology;;   ESOPHAGUS SURGERY     GASTROSTOMY N/A 05/25/2023   Procedure: OPEN INSERTION OF GASTROSTOMY TUBE;  Surgeon: Jordis Laneta FALCON, MD;  Location: [facility];  Service: General;  Laterality: N/A;   IR IMAGING GUIDED PORT INSERTION  02/07/2023   IR REPLACE G-TUBE SIMPLE WO FLUORO  02/27/2024   IR REPLC GASTRO/COLONIC TUBE PERCUT W/FLUORO  09/12/2023   KNEE SURGERY Right    LIPOSUCTION WITH LIPOFILLING Right 06/10/2019   Procedure: right breast scar and capsule release with fat grafting;  Surgeon: Lowery Estefana RAMAN, DO;  Location: [facility];  Service: Plastics;  Laterality: Right;   POLYPECTOMY  01/05/2023   Procedure: POLYPECTOMY;  Surgeon: Unk Corinn Skiff, MD;  Location: [facility];  Service: Gastroenterology;;   PORT-A-CATH REMOVAL     PORTACATH PLACEMENT Right 01/17/2018   Procedure: INSERTION PORT-A-CATH- RIGHT;  Surgeon: Dessa Reyes ORN, MD;  Location: [facility];  Service: General;  Laterality: Right;   PULMONARY THROMBECTOMY Bilateral 05/12/2023   Procedure: PULMONARY THROMBECTOMY;  Surgeon: Marea Selinda RAMAN, MD;  Location: [facility];  Service: Cardiovascular;  Laterality: Bilateral;   ROBOTIC ADRENALECTOMY Right 12/28/2016   Procedure: ROBOTIC ADRENALECTOMY;  Surgeon: Penne Knee, MD;  Location: [facility];  Service: Urology;  Laterality: Right;   SKIN CANCER EXCISION     THROAT SURGERY  1998   throat cancer    TUBAL LIGATION     VENTRAL HERNIA REPAIR N/A 03/03/2017   Procedure: HERNIA REPAIR VENTRAL ADULT;  Surgeon: Shelva Dunnings, MD;  Location: [facility];  Service: General;   Laterality: N/A;   VIDEO BRONCHOSCOPY WITH ENDOBRONCHIAL ULTRASOUND N/A 01/25/2023   Procedure: VIDEO BRONCHOSCOPY WITH ENDOBRONCHIAL ULTRASOUND;  Surgeon: Parris Manna, MD;  Location: [facility];  Service: Thoracic;  Laterality: N/A;   VIDEO BRONCHOSCOPY WITH ENDOBRONCHIAL ULTRASOUND N/A 02/17/2023   Procedure: VIDEO BRONCHOSCOPY WITH ENDOBRONCHIAL ULTRASOUND;  Surgeon: Parris Manna, MD;  Location: [facility];  Service: Thoracic;  Laterality: N/A;    Physical Exam   Triage Vital Signs: ED Triage Vitals [04/01/24 1405]  Encounter Vitals Group     BP 129/63     Girls Systolic BP Percentile      Girls Diastolic BP Percentile      Boys Systolic BP Percentile      Boys Diastolic BP Percentile      Pulse Rate 81     Resp 18     Temp 98.5 F (36.9 C)     Temp Source Oral     SpO2 100 %     Weight 110 lb 3.7 oz (50 kg)     Height 5' 1 (1.549 m)     Head Circumference      Peak Flow      Pain Score 6     Pain Loc      Pain Education      Exclude from Growth Chart     Most recent vital signs: Vitals:   04/01/24 1833 04/01/24 1919  BP:    Pulse:    Resp:    Temp:    SpO2: 98% 98%    General: Awake, no distress.  CV:  Good peripheral perfusion.  Regular rate rhythm Resp:  Normal effort.  Good air entry in all lung fields.  Deep breathing provokes cough which produces expiratory wheezing.  No crackles Abd:  No distention.  Soft nontender Other:  Dry oral mucosa.  No lower extremity edema   ED Results / Procedures / Treatments   Labs (all labs ordered are listed, but only abnormal results are displayed) Labs Reviewed  BASIC METABOLIC PANEL WITH GFR - Abnormal; Notable for the following components:      Result Value   Glucose, Bld 129 (*)    All other components within normal limits  RESP PANEL BY RT-PCR (RSV, FLU A&B, COVID)  [id number]  CBC  TROPONIN T, HIGH SENSITIVITY  TROPONIN T, HIGH SENSITIVITY     EKG Interpreted by me Sinus rhythm rate of 84.  Normal axis,  normal intervals.  Poor R wave progression.  No acute ischemic changes.   RADIOLOGY Chest x-ray interpreted by me, no focal consolidation.  Radiology report reviewed   PROCEDURES:  Procedures   MEDICATIONS  ORDERED IN ED: Medications  doxycycline  (VIBRA -TABS) tablet 100 mg (has no administration in time range)  lactated ringers  bolus 1,000 mL (1,000 mLs Intravenous New Bag/Given 04/01/24 1753)  ondansetron  (ZOFRAN ) injection 4 mg (4 mg Intravenous Given 04/01/24 1748)  pantoprazole  (PROTONIX ) injection 40 mg (40 mg Intravenous Given 04/01/24 1750)  acetaminophen  (TYLENOL ) 160 MG/5ML solution 650 mg (650 mg Per Tube Given 04/01/24 1826)  guaiFENesin  (ROBITUSSIN) 100 MG/5ML liquid 15 mL (15 mLs Per Tube Given 04/01/24 1943)     IMPRESSION / MDM / ASSESSMENT AND PLAN / ED COURSE  I reviewed the triage vital signs and the nursing notes.  DDx: COVID, influenza, other respiratory viral infection, dehydration, AKI, electrolyte derangement, unlikely NSTEMI.  Doubt PE or dissection or pericardial effusion  Patient's presentation is most consistent with acute presentation with potential threat to life or bodily function.     Clinical Course as of 04/01/24 2013  Mon Apr 01, 2024  1722 Patient comes presents with influenza-like illness and exam consistent with bronchitis.  Vital signs, labs unremarkable, doubt sepsis.  Doubt ACS PE dissection.  Will start antibiotics due to age and chemotherapy. [PS]    Clinical Course User Index [PS] Viviann Pastor, MD    ----------------------------------------- 8:13 PM on 04/01/2024 ----------------------------------------- Feeling better after fluids and medication for supportive care.  Workup reassuring, vitals normal.  I considered admission, but with reassuring exam and workup, I doubt any EMC.  Patient is comfortable with home care and outpatient follow-up.   FINAL CLINICAL IMPRESSION(S) / ED DIAGNOSES   Final diagnoses:  Influenza-like  illness  Dehydration  Malignant neoplasm of lung, unspecified laterality, unspecified part of lung (HCC)     Rx / DC Orders   ED Discharge Orders          Ordered    guaiFENesin  (ROBITUSSIN) 100 MG/5ML liquid  2 times daily        04/01/24 2008    doxycycline  (MONODOX ) 100 MG capsule  2 times daily        04/01/24 2008    ondansetron  (ZOFRAN -ODT) 4 MG disintegrating tablet  Every 8 hours PRN        04/01/24 2008             Note:  This document was prepared using Dragon voice recognition software and may include unintentional dictation errors.   Viviann Pastor, MD 04/01/24 2014

## 2024-04-03 ENCOUNTER — Inpatient Hospital Stay: Admitting: Hospice and Palliative Medicine

## 2024-04-03 ENCOUNTER — Inpatient Hospital Stay: Admitting: Oncology

## 2024-04-03 ENCOUNTER — Inpatient Hospital Stay: Attending: Oncology

## 2024-04-03 ENCOUNTER — Inpatient Hospital Stay

## 2024-04-04 ENCOUNTER — Inpatient Hospital Stay

## 2024-04-10 ENCOUNTER — Inpatient Hospital Stay: Admitting: Hospice and Palliative Medicine

## 2024-04-10 ENCOUNTER — Inpatient Hospital Stay: Attending: Oncology

## 2024-04-10 ENCOUNTER — Inpatient Hospital Stay: Admitting: Oncology

## 2024-04-10 ENCOUNTER — Telehealth: Payer: Self-pay | Admitting: Oncology

## 2024-04-10 ENCOUNTER — Inpatient Hospital Stay

## 2024-04-10 ENCOUNTER — Other Ambulatory Visit: Payer: Self-pay

## 2024-04-10 NOTE — Telephone Encounter (Signed)
 Pt called and states she thought her appt was on a different day. I rescheduled the appt to 12/16.

## 2024-04-11 ENCOUNTER — Encounter: Payer: Self-pay | Admitting: Family Medicine

## 2024-04-11 ENCOUNTER — Encounter: Payer: Self-pay | Admitting: Oncology

## 2024-04-11 ENCOUNTER — Inpatient Hospital Stay: Attending: Oncology

## 2024-04-11 ENCOUNTER — Telehealth: Payer: Self-pay

## 2024-04-11 ENCOUNTER — Ambulatory Visit: Admitting: Family Medicine

## 2024-04-11 VITALS — BP 110/56 | HR 74 | Temp 97.5°F | Resp 16 | Wt 107.1 lb

## 2024-04-11 DIAGNOSIS — J069 Acute upper respiratory infection, unspecified: Secondary | ICD-10-CM | POA: Diagnosis not present

## 2024-04-11 DIAGNOSIS — Z8521 Personal history of malignant neoplasm of larynx: Secondary | ICD-10-CM | POA: Insufficient documentation

## 2024-04-11 DIAGNOSIS — C7951 Secondary malignant neoplasm of bone: Secondary | ICD-10-CM | POA: Insufficient documentation

## 2024-04-11 DIAGNOSIS — R197 Diarrhea, unspecified: Secondary | ICD-10-CM | POA: Insufficient documentation

## 2024-04-11 DIAGNOSIS — C3490 Malignant neoplasm of unspecified part of unspecified bronchus or lung: Secondary | ICD-10-CM | POA: Diagnosis not present

## 2024-04-11 DIAGNOSIS — C3491 Malignant neoplasm of unspecified part of right bronchus or lung: Secondary | ICD-10-CM | POA: Insufficient documentation

## 2024-04-11 DIAGNOSIS — G893 Neoplasm related pain (acute) (chronic): Secondary | ICD-10-CM | POA: Insufficient documentation

## 2024-04-11 DIAGNOSIS — E538 Deficiency of other specified B group vitamins: Secondary | ICD-10-CM | POA: Insufficient documentation

## 2024-04-11 DIAGNOSIS — Z853 Personal history of malignant neoplasm of breast: Secondary | ICD-10-CM | POA: Insufficient documentation

## 2024-04-11 DIAGNOSIS — R1310 Dysphagia, unspecified: Secondary | ICD-10-CM | POA: Insufficient documentation

## 2024-04-11 DIAGNOSIS — Z87891 Personal history of nicotine dependence: Secondary | ICD-10-CM | POA: Insufficient documentation

## 2024-04-11 DIAGNOSIS — Z931 Gastrostomy status: Secondary | ICD-10-CM | POA: Insufficient documentation

## 2024-04-11 DIAGNOSIS — L03011 Cellulitis of right finger: Secondary | ICD-10-CM | POA: Diagnosis not present

## 2024-04-11 DIAGNOSIS — Z923 Personal history of irradiation: Secondary | ICD-10-CM | POA: Insufficient documentation

## 2024-04-11 DIAGNOSIS — Z9221 Personal history of antineoplastic chemotherapy: Secondary | ICD-10-CM | POA: Insufficient documentation

## 2024-04-11 MED ORDER — PREDNISONE 20 MG PO TABS: 20.0000 mg | ORAL_TABLET | Freq: Every day | ORAL | 0 refills | Status: DC

## 2024-04-11 NOTE — Patient Instructions (Signed)
 To keep you healthy, please keep in mind the following health maintenance items that you are due for:   Health Maintenance Due  Topic Date Due   Zoster Vaccines- Shingrix (1 of 2) Never done   Bone Density Scan  Never done   COVID-19 Vaccine (4 - 2025-26 season) 01/01/2024   Mammogram  03/08/2024     Best Wishes,   Dr. Lang

## 2024-04-11 NOTE — Progress Notes (Signed)
 Established patient visit   Patient: Denise Watts   DOB: 01/18/1956   68 y.o. Female  MRN: [id number] Visit Date: 04/11/2024  Today's healthcare provider: Rockie Agent, MD   Chief Complaint  Patient presents with   Follow-up    Follow up ER visit  Patient was seen in ER for Bronchitis on 04/01/24 She was treated for Influenza-like illness,Dehydration Malignant neoplasm of lung, unspecified laterality, unspecified part of lung. Treatment for this included    guaiFENesin  (ROBITUSSIN) 100 MG/5ML liquid  2 times daily doxycycline  (MONODOX ) 100 MG capsule  2 times daily ondansetron  (ZOFRAN -ODT) 4 MG disintegrating tablet  Every 8 hours PRN      She reports this condition is a little improvement.   Subjective     HPI     Follow-up    Additional comments: Follow up ER visit  Patient was seen in ER for Bronchitis on 04/01/24 She was treated for Influenza-like illness,Dehydration Malignant neoplasm of lung, unspecified laterality, unspecified part of lung. Treatment for this included    guaiFENesin  (ROBITUSSIN) 100 MG/5ML liquid  2 times daily doxycycline  (MONODOX ) 100 MG capsule  2 times daily ondansetron  (ZOFRAN -ODT) 4 MG disintegrating tablet  Every 8 hours PRN      She reports this condition is a little improvement.      Last edited by Rosas, Joseline E, CMA on 04/11/2024 10:21 AM.       Discussed the use of AI scribe software for clinical note transcription with the patient, who gave verbal consent to proceed.  History of Present Illness Denise Watts is a 68 year old female with malignant neoplasm of the lung who presents with flu-like symptoms.  She was evaluated in the emergency department on December 1st for influenza-like illness and dehydration. She received lactated Ringer 's fluids and Protonix  during her ED visit, and she reported feeling slightly better after receiving fluids.  She has no fever, and her oxygen  saturation is 100% on room air.  She has been taking doxycycline  100 mg twice daily and tolerates it well. She uses cough medicine, including Teflon pearls and guaifenesin , which have provided some relief.  She has no chest pain or vomiting, but she experienced diarrhea during her ED visit. She tested negative for COVID-19, influenza A and B, and RSV. Her blood glucose was slightly elevated at 129, but her CBC was normal.     Past Medical History:  Diagnosis Date   Acute pulmonary embolism (HCC) 05/10/2023   Adenomatous colon polyp    Adrenal carcinoma, right (HCC) 12/28/2016   a.) high grade carcinoma with extensive necrosis --> s/p adrenalectomy   Anxiety    Aortic atherosclerosis    Arthritis of right knee    Atrial fibrillation and flutter (HCC)    a.) CHA2DS2-VASc = 3 (age, sex, vascular disease history) as of 02/13/2023; b.) cardiac rate/rhythm maintained intrinsically without pharmacological intervention; no chronic OAC (does take low dose ASA)   B12 deficiency    Breast cancer (HCC) 1997   a.) stage IB (cT1a, cN0, cM0, G3, ER+, PR-, HER2-); s/p lumpectomy + XRT   COPD (chronic obstructive pulmonary disease) (HCC)    Coronary artery disease    a.) cCTA 12/09/2021: Ca2+ = 135 (80th %'ile for age/sex/race match contol) --> 25-49% pLAD and RCA distributions   Depression    Diastolic dysfunction    a.) TTE 12/11/2020, no RWMAs, norm RVSF, G1DD; b.) TTE 12/14/2021: EF 55-60, no RWMAs, norm RVSF, G1DD; c.) TTE 01/27/2023:  EF 50-55%, no RWMAs, G1DD, mild RAE, mild MR, mod TR   Dry cough 03/02/2017   Dyspnea    Full dentures    GERD (gastroesophageal reflux disease)    History of hiatal hernia    HLD (hyperlipidemia)    Invasive carcinoma of breast (HCC) 11/15/2017   a.) path (+) for G3, ER -, PR -, HER2/neu - ; s/p lumpectomy 01/17/2018 + 4 cycles TC systemic chemotherapy + XRT   Long term current use of aspirin     Personal history of chemotherapy    Personal history of radiation therapy    Pneumothorax of left  lung after biopsy 01/25/2023   a.) hypoxic in PACU requiring re-intubation --> transferred to ICU --> chest tube placed by PCCM   RLS (restless legs syndrome)    a.) on ropinirole    Skin cancer    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   a.) Stage IB (cT1a, cN0, cM0, G3, ER+, PR-, HER2-)    Medications: Show/hide medication list[1]  Review of Systems  Last metabolic panel Lab Results  Component Value Date   GLUCOSE 129 (H) 04/01/2024   NA 139 04/01/2024   K 4.1 04/01/2024   CL 103 04/01/2024   CO2 25 04/01/2024   BUN 14 04/01/2024   CREATININE 0.70 04/01/2024   GFRNONAA >60 04/01/2024   CALCIUM  9.0 04/01/2024   PHOS 4.1 06/01/2023   PROT 6.4 (L) 02/26/2024   ALBUMIN  3.5 02/26/2024   LABGLOB 2.2 11/03/2021   AGRATIO 1.9 11/03/2021   BILITOT 0.6 02/26/2024   ALKPHOS 75 02/26/2024   AST 76 (H) 02/26/2024   ALT 87 (H) 02/26/2024   ANIONGAP 11 04/01/2024   Last lipids Lab Results  Component Value Date   CHOL 135 05/26/2021   HDL 46 05/26/2021   LDLCALC 68 05/26/2021   TRIG 97 01/26/2023   CHOLHDL 2.9 05/26/2021   Last hemoglobin A1c Lab Results  Component Value Date   HGBA1C 5.7 (H) 11/03/2021   Last thyroid  functions Lab Results  Component Value Date   TSH 0.452 01/26/2023   T4TOTAL 8.8 01/26/2023   FREET4 1.03 11/03/2021        Objective    BP (!) 110/56 (BP Location: Left Arm, Patient Position: Sitting, Cuff Size: Normal)   Pulse 74   Temp (!) 97.5 F (36.4 C) (Oral)   Resp 16   Wt 107 lb 1.6 oz (48.6 kg)   SpO2 100%   BMI 20.24 kg/m  BP Readings from Last 3 Encounters:  04/11/24 (!) 110/56  04/01/24 (!) 113/53  03/25/24 133/80   Wt Readings from Last 3 Encounters:  04/11/24 107 lb 1.6 oz (48.6 kg)  04/01/24 110 lb 3.7 oz (50 kg)  03/25/24 110 lb 9.6 oz (50.2 kg)        Physical Exam  Physical Exam VITALS: T- 97.5, P- 74, RR- 16, SaO2- 100% GENERAL: Ill appearing, non-toxic, frequent non-productive cough, no acute  distress. CHEST: Clear to auscultation bilaterally.    No results found for any visits on 04/11/24.  Assessment & Plan     Problem List Items Addressed This Visit     Non-small cell lung cancer (HCC)   Relevant Medications   predniSONE  (DELTASONE ) 20 MG tablet   Other Visit Diagnoses       Viral upper respiratory tract infection with cough    -  Primary   Relevant Medications   predniSONE  (DELTASONE ) 20 MG tablet     Paronychia of finger of  right hand       Relevant Medications   pregabalin (LYRICA) 25 MG capsule        Assessment & Plan Acute upper respiratory infection Symptoms of cough and fatigue. Negative for COVID, influenza A and B, and RSV. Oxygen  saturation is 100% on room air. No fever. Tolerating doxycycline  well. Symptoms may take longer to resolve due to compromised immune system from cancer treatment. - Continue doxycycline  100 mg twice daily for 7 days - Prescribed prednisone  20 mg daily for 5 days to reduce cough and inflammation - Encouraged rest and support system at home  Non-small cell lung cancer Chronic condition  Known diagnosis with previous treatment including afatinib . Currently experiencing fatigue, possibly related to cancer treatment. No chemotherapy initiated yet. Oncology appointment scheduled for next week. - Continue follow-up with oncology - Ensure completion of antibiotic regimen with doxycycline   -continue to follow up with pulmonology and oncology as scheduled   Paronychia of right finger Chronic, apparent SE of the cancer treatment  Paronychia of the right finger with pain and swelling. Currently on gabapentin  300 mg twice daily and Cymbalta  60 mg daily. Considering alternative treatment due to persistent pain. - Initiated Lyrica 25 mg twice daily - Apply moisturizer with Vaseline to affected area     Return if symptoms worsen or fail to improve.         Rockie Agent, MD  [facility] [phone] (phone) [phone] (fax)  [facility]     [1]  Outpatient Medications Prior to Visit  Medication Sig   albuterol  (VENTOLIN  HFA) 108 (90 Base) MCG/ACT inhaler Inhale 2 puffs into the lungs every 6 (six) hours as needed for wheezing or shortness of breath.   apixaban  (ELIQUIS ) 5 MG TABS tablet Take 1 tablet (5 mg total) by mouth 2 (two) times daily.   benzonatate  (TESSALON ) 100 MG capsule Take 2 capsules (200 mg total) by mouth 3 (three) times daily as needed for cough.   bisacodyl  (DULCOLAX) 10 MG suppository Place 1 suppository (10 mg total) rectally daily as needed for moderate constipation or severe constipation.   buPROPion  (WELLBUTRIN ) 75 MG tablet Take 75 mg by mouth 2 (two) times daily.   busPIRone  (BUSPAR ) 5 MG tablet Take 1 tablet (5 mg total) by mouth 3 (three) times daily as needed.   cefdinir  (OMNICEF ) 300 MG capsule Take 1 capsule (300 mg total) by mouth 2 (two) times daily.   cyclobenzaprine  (FLEXERIL ) 5 MG tablet Take 5 mg by mouth 3 (three) times daily as needed for muscle spasms.   DULoxetine  (CYMBALTA ) 60 MG capsule TAKE 1 CAPSULE BY MOUTH DAILY   fentaNYL  (DURAGESIC ) 12 MCG/HR Place 1 patch onto the skin every 3 (three) days.   fluticasone  (FLONASE ) 50 MCG/ACT nasal spray Place 2 sprays into both nostrils daily.   Fluticasone -Umeclidin-Vilant (TRELEGY ELLIPTA ) 100-62.5-25 MCG/ACT AEPB Inhale 1 Act into the lungs daily.   Fluticasone -Umeclidin-Vilant (TRELEGY ELLIPTA ) 100-62.5-25 MCG/ACT AEPB Inhale 1 puff into the lungs daily.   GILOTRIF  30 MG tablet TAKE 1 TABLET DAILY ON AN EMPTY STOMACH AT LEAST 1 HOUR BEFORE OR 2 HOURS AFTER MEALS   ipratropium-albuterol  (DUONEB) 0.5-2.5 (3) MG/3ML SOLN Take 3 mLs by nebulization every 4 (four) hours as needed.   lactulose , encephalopathy, (CHRONULAC ) 10 GM/15ML SOLN Place 30 mLs (20 g total) into feeding tube 2 (two) times daily.   lidocaine  (LIDODERM ) 5 % Place 1 patch onto the skin daily.  Remove & Discard patch within 12 hours  or as directed by MD   meloxicam  (MOBIC ) 7.5 MG tablet Take 1 tablet (7.5 mg total) by mouth daily.   metoprolol  succinate (TOPROL -XL) 25 MG 24 hr tablet TAKE 1 TABLET BY MOUTH DAILY   montelukast  (SINGULAIR ) 10 MG tablet TAKE 1 TABLET BY MOUTH AT BEDTIME   Nutritional Supplements (NUTREN 1.5) LIQD Give 1 carton 4 times a day (8am, noon, 4pm and 8pm). Flush with 60ml of water  before and after each feeding.  Give additional 1 1/2 cups of water  a day via tube or drink orally for adequate hydration.   omeprazole  (KONVOMEP ) 2 mg/mL SUSP oral suspension Take 20 mLs (40 mg total) by mouth daily.   ondansetron  (ZOFRAN ) 4 MG tablet Take 1 tablet (4 mg total) by mouth every 6 (six) hours as needed for nausea or vomiting.   ondansetron  (ZOFRAN -ODT) 4 MG disintegrating tablet Take 1 tablet (4 mg total) by mouth every 8 (eight) hours as needed for nausea or vomiting.   oxyCODONE  (ROXICODONE ) 5 MG/5ML solution Take 5 mLs (5 mg total) by mouth every 8 (eight) hours as needed for severe pain (pain score 7-10).   predniSONE  (DELTASONE ) 20 MG tablet Take 1 tablet (20 mg total) by mouth daily with breakfast. 7 days   rOPINIRole  (REQUIP ) 0.5 MG tablet Take 1 tablet (0.5 mg total) by mouth 3 (three) times daily.   rosuvastatin  (CRESTOR ) 40 MG tablet PLACE 1 TABLET (40 MG TOTAL) INTO FEEDING TUBE DAILY   senna (SENOKOT) 8.6 MG TABS tablet Place 1 tablet (8.6 mg total) into feeding tube daily.   sucralfate  (CARAFATE ) 1 GM/10ML suspension Place 10 mLs (1 g total) into feeding tube 4 (four) times daily.   tolterodine  (DETROL  LA) 4 MG 24 hr capsule Take 4 mg by mouth daily.   triamcinolone  cream (KENALOG ) 0.1 % Apply 1 Application topically 2 (two) times daily.   Water  For Irrigation, Sterile (FREE WATER ) SOLN Place 60 mLs into feeding tube 5 (five) times daily.   [DISCONTINUED] gabapentin  (NEURONTIN ) 300 MG capsule Take 300 mg by mouth 2 (two) times daily.   Facility-Administered  Medications Prior to Visit  Medication Dose Route Frequency Provider   cyanocobalamin  (VITAMIN B12) injection 1,000 mcg  1,000 mcg Intramuscular Q30 days Melanee Annah BROCKS, MD

## 2024-04-11 NOTE — Telephone Encounter (Signed)
 Attempted to call patient to inform her that Dr. Melanee will send refills for Oxycodone  and Fentanyl  at her next MD visit on 12/16.  But, no answer and no voicemail available.

## 2024-04-11 NOTE — Telephone Encounter (Signed)
 Patient has not come for 2 appts in the past. She has an appt coming up with me next week. I will refill if she she shows up for that appt

## 2024-04-11 NOTE — Progress Notes (Signed)
 Nutrition  Called patient for scheduled phone nutrition follow-up visit.  Unable to reach patient or leave voicemail due to mailbox being full.

## 2024-04-12 ENCOUNTER — Encounter: Payer: Self-pay | Admitting: Oncology

## 2024-04-12 MED ORDER — FENTANYL 12 MCG/HR TD PT72: 1.0000 | MEDICATED_PATCH | TRANSDERMAL | 0 refills | Status: DC

## 2024-04-12 MED ORDER — OXYCODONE HCL 5 MG/5ML PO SOLN: 5.0000 mg | Freq: Three times a day (TID) | ORAL | 0 refills | Status: DC | PRN

## 2024-04-12 NOTE — Telephone Encounter (Signed)
 Patient has no showed to two previous appointments.  Per Dr. Melanee patient will need to be seen prior to refills being processed; next appointment scheduled 04/16/24.

## 2024-04-12 NOTE — Telephone Encounter (Signed)
 Voicemail received today 04/12/24 at 1:27pm stating she has not received a script for Fentanyl  nor Oxycodone  yet (next follow up visit with Dr. Melanee is scheduled 04/16/24).  Looks like the last Fentanyl  prescribed 03/25/24 went to [facility]; last oxycodone  filled 03/23/24 filled at [facility].

## 2024-04-12 NOTE — Telephone Encounter (Signed)
 Dr. Melanee indicated okay to send a 5 day supply of oxy and fentanyl  to hold over patient until 12/16 appointment. Outbound call to patient;

## 2024-04-12 NOTE — Telephone Encounter (Signed)
 Voicemail received today 04/12/24 at 4:29pm from [place] at [facility] indicating fentanyl  comes in boxes of 5 and they cannot split box; if wanting to fill a new script for quantity of 5 will need to be sent.  Best call back number [phone].

## 2024-04-12 NOTE — Telephone Encounter (Signed)
 Outbound call to [facility], spoke to [place] and informed not to fill the quantity of 2 and if Dr. Melanee decides to fill for a full box she will send in a new script.

## 2024-04-15 ENCOUNTER — Encounter: Payer: Self-pay | Admitting: Oncology

## 2024-04-16 ENCOUNTER — Encounter: Payer: Self-pay | Admitting: Oncology

## 2024-04-16 ENCOUNTER — Inpatient Hospital Stay: Admitting: Hospice and Palliative Medicine

## 2024-04-16 ENCOUNTER — Other Ambulatory Visit: Payer: Self-pay

## 2024-04-16 ENCOUNTER — Inpatient Hospital Stay: Admitting: Oncology

## 2024-04-16 ENCOUNTER — Inpatient Hospital Stay

## 2024-04-16 VITALS — BP 133/69 | HR 65 | Temp 98.1°F | Ht 61.0 in | Wt 110.0 lb

## 2024-04-16 DIAGNOSIS — C3491 Malignant neoplasm of unspecified part of right bronchus or lung: Secondary | ICD-10-CM

## 2024-04-16 DIAGNOSIS — G893 Neoplasm related pain (acute) (chronic): Secondary | ICD-10-CM

## 2024-04-16 DIAGNOSIS — E538 Deficiency of other specified B group vitamins: Secondary | ICD-10-CM | POA: Diagnosis not present

## 2024-04-16 DIAGNOSIS — R52 Pain, unspecified: Secondary | ICD-10-CM

## 2024-04-16 DIAGNOSIS — Z23 Encounter for immunization: Secondary | ICD-10-CM

## 2024-04-16 DIAGNOSIS — C7951 Secondary malignant neoplasm of bone: Secondary | ICD-10-CM | POA: Diagnosis not present

## 2024-04-16 DIAGNOSIS — Z931 Gastrostomy status: Secondary | ICD-10-CM | POA: Diagnosis not present

## 2024-04-16 DIAGNOSIS — Z9221 Personal history of antineoplastic chemotherapy: Secondary | ICD-10-CM | POA: Diagnosis not present

## 2024-04-16 DIAGNOSIS — Z79899 Other long term (current) drug therapy: Secondary | ICD-10-CM

## 2024-04-16 DIAGNOSIS — Z923 Personal history of irradiation: Secondary | ICD-10-CM | POA: Diagnosis not present

## 2024-04-16 DIAGNOSIS — R1310 Dysphagia, unspecified: Secondary | ICD-10-CM | POA: Diagnosis not present

## 2024-04-16 LAB — CMP (CANCER CENTER ONLY)
ALT: 42 U/L (ref 0–44)
AST: 45 U/L — ABNORMAL HIGH (ref 15–41)
Albumin: 3.6 g/dL (ref 3.5–5.0)
Alkaline Phosphatase: 84 U/L (ref 38–126)
Anion gap: 9 (ref 5–15)
BUN: 20 mg/dL (ref 8–23)
CO2: 27 mmol/L (ref 22–32)
Calcium: 9.2 mg/dL (ref 8.9–10.3)
Chloride: 104 mmol/L (ref 98–111)
Creatinine: 0.74 mg/dL (ref 0.44–1.00)
GFR, Estimated: >60 mL/min (ref >60)
Glucose, Bld: 106 mg/dL — ABNORMAL HIGH (ref 70–99)
Potassium: 4.2 mmol/L (ref 3.5–5.1)
Sodium: 140 mmol/L (ref 135–145)
Total Bilirubin: 0.4 mg/dL (ref 0.0–1.2)
Total Protein: 5.9 g/dL — ABNORMAL LOW (ref 6.5–8.1)

## 2024-04-16 LAB — CBC WITH DIFFERENTIAL (CANCER CENTER ONLY)
Abs Immature Granulocytes: 0.04 K/uL (ref 0.00–0.07)
Basophils Absolute: 0 K/uL (ref 0.0–0.1)
Basophils Relative: 0 %
Eosinophils Absolute: 0.1 K/uL (ref 0.0–0.5)
Eosinophils Relative: 1 %
HCT: 38.2 % (ref 36.0–46.0)
Hemoglobin: 12.3 g/dL (ref 12.0–15.0)
Immature Granulocytes: 0 %
Lymphocytes Relative: 11 %
Lymphs Abs: 1 K/uL (ref 0.7–4.0)
MCH: 31.5 pg (ref 26.0–34.0)
MCHC: 32.2 g/dL (ref 30.0–36.0)
MCV: 97.9 fL (ref 80.0–100.0)
Monocytes Absolute: 0.6 K/uL (ref 0.1–1.0)
Monocytes Relative: 6 %
Neutro Abs: 7.7 K/uL (ref 1.7–7.7)
Neutrophils Relative %: 82 %
Platelet Count: 198 K/uL (ref 150–400)
RBC: 3.9 MIL/uL (ref 3.87–5.11)
RDW: 12.4 % (ref 11.5–15.5)
WBC Count: 9.5 K/uL (ref 4.0–10.5)
nRBC: 0 % (ref 0.0–0.2)

## 2024-04-16 MED ORDER — CYANOCOBALAMIN 1000 MCG/ML IJ SOLN
1000.0000 ug | INTRAMUSCULAR | Status: DC
  Administered 2024-04-16: 10:00:00 1000 ug via INTRAMUSCULAR
  Filled 2024-04-16: qty 1

## 2024-04-16 MED ORDER — OXYCODONE HCL 5 MG/5ML PO SOLN: 5.0000 mg | Freq: Three times a day (TID) | ORAL | 0 refills | Status: DC | PRN

## 2024-04-16 MED ORDER — FENTANYL 12 MCG/HR TD PT72: 1.0000 | MEDICATED_PATCH | TRANSDERMAL | 0 refills | Status: AC

## 2024-04-16 NOTE — Progress Notes (Signed)
 Outbound call to [facility], no answer x 2

## 2024-04-16 NOTE — Progress Notes (Signed)
 Hematology/Oncology Consult note [facility]  [phone] [phone]  Patient Care Team: [name], DO as PCP - General (Family Medicine) Darliss Rogue, MD as PCP - Cardiology (Cardiology) Melanee Annah BROCKS, MD as Consulting Physician (Oncology) Jordis Laneta FALCON, MD as Consulting Physician (General Surgery) Dasher, Alm LABOR, MD (Dermatology) Scheeler, Donnice PARAS, PA-C (Inactive) as Physician Assistant (Plastic Surgery) Dorothyann Drivers, MD as Attending Physician (Emergency Medicine) Ferrel Plowman Sotero RIGGERS (Neurology) French Mering, MD as Referring Physician (Internal Medicine) Shellia Oh, MD as Consulting Physician (Pulmonary Disease) Pa, [facility] (Optometry) Verdene Gills, RN as Oncology Nurse Navigator Lenn Aran, MD as Consulting Physician (Radiation Oncology) Moises Reusing, RN as [facility] [place], LCSW as [facility] [place], [place], [place]   Name of the patient: Denise Watts  [id number]  [birth year]   Date of visit: 04/16/2024  Diagnosis-metastatic lung cancer with bone metastases and uncommon eGFR mutation  Chief complaint/ Reason for visit-routine follow-up of lung cancer presently on afatinib   Heme/Onc history:  Oncology History Overview Note  1. Carcinoma of breast, T1, N0, M0 tumor diagnosis.  In January of 1997 2. Carcinoma of the vocal cord T1, N0, M0 tumor.  Status postradiation therapy 3. Abnormal CT scan of the chest (December, 2015) 4. Repeat CT scan of chest shows stable nodule (July, 2016) 5. Neutropenia with normal hemoglobin and normal platelet count (July, 2016)   Cancer of vocal cord [facility]) (Resolved)  11/10/2014 Initial Diagnosis   Cancer of vocal cord   Malignant neoplasm of lower-outer quadrant of right breast of female, estrogen receptor negative (HCC)  11/24/2017 Initial Diagnosis   Malignant neoplasm of lower-outer quadrant of right  breast of female, estrogen receptor negative (HCC)   01/12/2018 Cancer Staging   Staging form: Breast, AJCC 8th Edition - Clinical stage from 01/12/2018: Stage IB (cT1a, cN0, cM0, G3, ER+, PR-, HER2-) - Signed by Melanee Annah BROCKS, MD on 01/12/2018   01/30/2018 - 04/26/2018 Chemotherapy   The patient had dexamethasone  (DECADRON ) 4 MG tablet, 8 mg, Oral, 2 times daily, 1 of 1 cycle, Start date: 01/12/2018, End date: 08/03/2018 palonosetron  (ALOXI ) injection 0.25 mg, 0.25 mg, Intravenous,  Once, 4 of 4 cycles Administration: 0.25 mg (01/30/2018), 0.25 mg (02/27/2018), 0.25 mg (03/28/2018), 0.25 mg (04/26/2018) pegfilgrastim  (NEULASTA  ONPRO KIT) injection 6 mg, 6 mg, Subcutaneous, Once, 4 of 4 cycles Administration: 6 mg (01/30/2018), 6 mg (02/27/2018), 6 mg (03/28/2018), 6 mg (04/26/2018) cyclophosphamide  (CYTOXAN ) 1,000 mg in sodium chloride  0.9 % 250 mL chemo infusion, 600 mg/m2 = 1,000 mg, Intravenous,  Once, 4 of 4 cycles Administration: 1,000 mg (01/30/2018), 1,000 mg (02/27/2018), 1,000 mg (03/28/2018), 1,000 mg (04/26/2018) DOCEtaxel  (TAXOTERE ) 130 mg in sodium chloride  0.9 % 250 mL chemo infusion, 75 mg/m2 = 130 mg, Intravenous,  Once, 4 of 4 cycles Dose modification: 60 mg/m2 (original dose 75 mg/m2, Cycle 2, Reason: Dose not tolerated) Administration: 130 mg (01/30/2018), 100 mg (02/27/2018), 100 mg (03/28/2018), 100 mg (04/26/2018)  for chemotherapy treatment.    Non-small cell lung cancer (HCC)  03/09/2023 Initial Diagnosis   Non-small cell lung cancer (HCC)   03/20/2023 -  Chemotherapy   Patient is on Treatment Plan : LUNG Carboplatin  + Paclitaxel  q7d     Primary malignant neoplasm of right lung metastatic to other site [facility])  01/22/2024 Initial Diagnosis   Primary malignant neoplasm of right lung metastatic to other site [facility])   01/22/2024 Cancer Staging   Staging form: Lung, AJCC  V9 - Clinical stage from 01/22/2024: rcT4, rcN3, rpM1 - Signed by Melanee Annah BROCKS, MD on 01/22/2024 Stage  prefix: Recurrence     NGS testing from scapular biopsy showed evidence of eGFR point mutation P.G719S and P.V769M.  She was started on afatinib  on 02/12/2024   Interval history- Discussed the use of AI scribe software for clinical note transcription with the patient, who gave verbal consent to proceed.  History of Present Illness   Denise Watts is a 68 year old female with stage IV EGFR-mutant right lung cancer with bone metastases who presents for oncology follow-up and management of ongoing symptoms and treatment side effects.  She has been taking afatinib  daily since February 12, 2024, for metastatic lung cancer with an uncommon EGFR mutation. She reports no significant side effects from afatinib  except for xerosis and nail changes affecting her fingers. No significant cutaneous rash is present.  Bone metastases are managed with bone-strengthening infusions (Zemaira), with the last infusion administered at the end of October.  She remains PEG tube dependent due to dysphagia, though she is able to take some oral intake. The PEG tube was recently upsized due to prior issues, but the site continues to bleed and the surrounding skin is fragile. She finds the tube helpful for medication administration, as she continues to have difficulty swallowing.  Pain related to her malignancy is currently well controlled with fentanyl  and oxycodone . She recently ran out of pain medication and requested refills. She denies uncontrolled pain.  She developed diarrhea following a recent course of antibiotics for bronchitis, which she completed two days ago. Diarrhea has persisted since finishing antibiotics, and she is not currently taking any antidiarrheal medication. She attributes the diarrhea to the recent infection and antibiotic use.      ECOG PS- 2 Pain scale- 3 Opioid associated constipation- no  Review of systems- Review of Systems  Constitutional:  Positive for malaise/fatigue.  Gastrointestinal:         Pain and discharge around the PEG tube site      Allergies[1]   Past Medical History:  Diagnosis Date   Acute pulmonary embolism (HCC) 05/10/2023   Adenomatous colon polyp    Adrenal carcinoma, right (HCC) 12/28/2016   a.) high grade carcinoma with extensive necrosis --> s/p adrenalectomy   Anxiety    Aortic atherosclerosis    Arthritis of right knee    Atrial fibrillation and flutter (HCC)    a.) CHA2DS2-VASc = 3 (age, sex, vascular disease history) as of 02/13/2023; b.) cardiac rate/rhythm maintained intrinsically without pharmacological intervention; no chronic OAC (does take low dose ASA)   B12 deficiency    Breast cancer (HCC) 1997   a.) stage IB (cT1a, cN0, cM0, G3, ER+, PR-, HER2-); s/p lumpectomy + XRT   COPD (chronic obstructive pulmonary disease) (HCC)    Coronary artery disease    a.) cCTA 12/09/2021: Ca2+ = 135 (80th %'ile for age/sex/race match contol) --> 25-49% pLAD and RCA distributions   Depression    Diastolic dysfunction    a.) TTE 12/11/2020, no RWMAs, norm RVSF, G1DD; b.) TTE 12/14/2021: EF 55-60, no RWMAs, norm RVSF, G1DD; c.) TTE 01/27/2023: EF 50-55%, no RWMAs, G1DD, mild RAE, mild MR, mod TR   Dry cough 03/02/2017   Dyspnea    Full dentures    GERD (gastroesophageal reflux disease)    History of hiatal hernia    HLD (hyperlipidemia)    Invasive carcinoma of breast (HCC) 11/15/2017   a.) path (+) for G3, ER -,  PR -, HER2/neu - ; s/p lumpectomy 01/17/2018 + 4 cycles TC systemic chemotherapy + XRT   Long term current use of aspirin     Personal history of chemotherapy    Personal history of radiation therapy    Pneumothorax of left lung after biopsy 01/25/2023   a.) hypoxic in PACU requiring re-intubation --> transferred to ICU --> chest tube placed by PCCM   RLS (restless legs syndrome)    a.) on ropinirole    Skin cancer    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   a.) Stage IB (cT1a, cN0, cM0, G3, ER+, PR-, HER2-)     Past  Surgical History:  Procedure Laterality Date   BIOPSY  01/05/2023   Procedure: BIOPSY;  Surgeon: Unk Corinn Skiff, MD;  Location: [facility];  Service: Gastroenterology;;   BREAST BIOPSY Right 1997   positive   BREAST BIOPSY Right 11/15/2017   INVASIVE MAMMARY CARCINOMA triple negative   BREAST BIOPSY Right 07/20/2020   us  bx, Q marker, neg   BREAST LUMPECTOMY Right 1997   2019 also   BREAST LUMPECTOMY WITH SENTINEL LYMPH NODE BIOPSY Right 12/08/2017   Procedure: BREAST LUMPECTOMY WITH SENTINEL LYMPH NODE BX;  Surgeon: Dessa Reyes ORN, MD;  Location: [facility];  Service: General;  Laterality: Right;   BREAST REDUCTION WITH MASTOPEXY Left 06/10/2019   Procedure: Left breast mastopexy reduction for symmetry;  Surgeon: Lowery Estefana RAMAN, DO;  Location: [facility];  Service: Plastics;  Laterality: Left;  total 2.5 hours   CATARACT EXTRACTION W/PHACO Right 02/07/2022   Procedure: CATARACT EXTRACTION PHACO AND INTRAOCULAR LENS PLACEMENT (IOC) RIGHT DIABETIC;  Surgeon: Myrna Adine Anes, MD;  Location: [facility];  Service: Ophthalmology;  Laterality: Right;  3.60 00:34.7   CATARACT EXTRACTION W/PHACO Left 02/28/2022   Procedure: CATARACT EXTRACTION PHACO AND INTRAOCULAR LENS PLACEMENT (IOC) LEFT DIABETIC 4.11 00:26.1;  Surgeon: Myrna Adine Anes, MD;  Location: [facility];  Service: Ophthalmology;  Laterality: Left;   COLONOSCOPY  2015   COLONOSCOPY N/A 01/05/2023   Procedure: COLONOSCOPY;  Surgeon: Unk Corinn Skiff, MD;  Location: [facility];  Service: Gastroenterology;  Laterality: N/A;   COLONOSCOPY WITH PROPOFOL  N/A 10/29/2019   Procedure: COLONOSCOPY WITH PROPOFOL ;  Surgeon: Jinny Carmine, MD;  Location: [facility];  Service: Endoscopy;  Laterality: N/A;   ESOPHAGOGASTRODUODENOSCOPY (EGD) WITH PROPOFOL  N/A 05/24/2019   Procedure: ESOPHAGOGASTRODUODENOSCOPY (EGD) WITH PROPOFOL ;  Surgeon: Jinny Carmine, MD;  Location: [facility];   Service: Endoscopy;  Laterality: N/A;   ESOPHAGOGASTRODUODENOSCOPY (EGD) WITH PROPOFOL  N/A 09/06/2022   Procedure: ESOPHAGOGASTRODUODENOSCOPY (EGD) WITH PROPOFOL ;  Surgeon: Jinny Carmine, MD;  Location: [facility];  Service: Endoscopy;  Laterality: N/A;   ESOPHAGOGASTRODUODENOSCOPY (EGD) WITH PROPOFOL   01/05/2023   Procedure: ESOPHAGOGASTRODUODENOSCOPY (EGD) WITH PROPOFOL ;  Surgeon: Unk Corinn Skiff, MD;  Location: [facility];  Service: Gastroenterology;;   ESOPHAGUS SURGERY     GASTROSTOMY N/A 05/25/2023   Procedure: OPEN INSERTION OF GASTROSTOMY TUBE;  Surgeon: Jordis Laneta FALCON, MD;  Location: [facility];  Service: General;  Laterality: N/A;   IR IMAGING GUIDED PORT INSERTION  02/07/2023   IR REPLACE G-TUBE SIMPLE WO FLUORO  02/27/2024   IR REPLC GASTRO/COLONIC TUBE PERCUT W/FLUORO  09/12/2023   KNEE SURGERY Right    LIPOSUCTION WITH LIPOFILLING Right 06/10/2019   Procedure: right breast scar and capsule release with fat grafting;  Surgeon: Lowery Estefana RAMAN, DO;  Location: [facility];  Service: Plastics;  Laterality: Right;   POLYPECTOMY  01/05/2023  Procedure: POLYPECTOMY;  Surgeon: Unk Corinn Skiff, MD;  Location: [facility] END[facility];  Service: Gastroenterology;;   [facility] REMOVAL     PORTACATH PLACEMENT Right 01/17/2018   Procedure: INSERTION PORT-A-CATH- RIGHT;  Surgeon: Dessa Reyes ORN, MD;  Location: [facility];  Service: General;  Laterality: Right;   PULMONARY THROMBECTOMY Bilateral 05/12/2023   Procedure: PULMONARY THROMBECTOMY;  Surgeon: Marea Selinda RAMAN, MD;  Location: [facility];  Service: Cardiovascular;  Laterality: Bilateral;   ROBOTIC ADRENALECTOMY Right 12/28/2016   Procedure: ROBOTIC ADRENALECTOMY;  Surgeon: Penne Knee, MD;  Location: [facility];  Service: Urology;  Laterality: Right;   SKIN CANCER EXCISION     THROAT SURGERY  1998   throat cancer    TUBAL LIGATION     VENTRAL HERNIA REPAIR N/A 03/03/2017   Procedure: HERNIA REPAIR VENTRAL  ADULT;  Surgeon: Shelva Dunnings, MD;  Location: [facility];  Service: General;  Laterality: N/A;   VIDEO BRONCHOSCOPY WITH ENDOBRONCHIAL ULTRASOUND N/A 01/25/2023   Procedure: VIDEO BRONCHOSCOPY WITH ENDOBRONCHIAL ULTRASOUND;  Surgeon: Parris Manna, MD;  Location: [facility];  Service: Thoracic;  Laterality: N/A;   VIDEO BRONCHOSCOPY WITH ENDOBRONCHIAL ULTRASOUND N/A 02/17/2023   Procedure: VIDEO BRONCHOSCOPY WITH ENDOBRONCHIAL ULTRASOUND;  Surgeon: Parris Manna, MD;  Location: [facility];  Service: Thoracic;  Laterality: N/A;    Social History   Socioeconomic History   Marital status: Married    Spouse name: Balistreri,Joseph (Spouse) [phone] ([place])   Number of children: 4   Years of education: Not on file   Highest education level: 10th grade  Occupational History   Occupation: retired  Tobacco Use   Smoking status: Former    Current packs/day: 0.00    Average packs/day: 1.5 packs/day for 30.0 years (45.0 ttl pk-yrs)    Types: Cigarettes    Start date: 05/02/1965    Quit date: 05/03/1995    Years since quitting: 28.9    Passive exposure: Past   Smokeless tobacco: Never  Vaping Use   Vaping status: Never Used  Substance and Sexual Activity   Alcohol use: Not Currently    Alcohol/week: 4.0 standard drinks of alcohol    Types: 4 Shots of liquor per week    Comment: none   Drug use: Never   Sexual activity: Not Currently    Birth control/protection: Post-menopausal  Other Topics Concern   Not on file  Social History Narrative   Patient from home with husband.   Social Drivers of Health   Tobacco Use: Medium Risk (04/16/2024)   Patient History    Smoking Tobacco Use: Former    Smokeless Tobacco Use: Never    Passive Exposure: Past  Physicist, Medical Strain: Low Risk (12/06/2023)   Overall Financial Resource Strain (CARDIA)    Difficulty of Paying Living Expenses: Not hard at all  Food Insecurity: No Food Insecurity (12/06/2023)   Epic    Worried About Programme Researcher, Broadcasting/film/video  in the Last Year: Never true    Ran Out of Food in the Last Year: Never true  Transportation Needs: No Transportation Needs (12/06/2023)   Epic    Lack of Transportation (Medical): No    Lack of Transportation (Non-Medical): No  Physical Activity: Insufficiently Active (12/06/2023)   Exercise Vital Sign    Days of Exercise per Week: 3 days    Minutes of Exercise per Session: 30 min  Stress: No Stress Concern Present (12/06/2023)   [employer] of Occupational Health - Occupational Stress Questionnaire    Feeling of Stress: Only  a little  Social Connections: Moderately Isolated (12/06/2023)   Social Connection and Isolation Panel    Frequency of Communication with Friends and Family: More than three times a week    Frequency of Social Gatherings with Friends and Family: Never    Attends Religious Services: Never    Database Administrator or Organizations: No    Attends Banker Meetings: Never    Marital Status: Married  Catering Manager Violence: Not At Risk (12/06/2023)   Epic    Fear of Current or Ex-Partner: No    Emotionally Abused: No    Physically Abused: No    Sexually Abused: No  Depression (PHQ2-9): Medium Risk (04/16/2024)   Depression (PHQ2-9)    PHQ-2 Score: 6  Alcohol Screen: Low Risk (12/06/2023)   Alcohol Screen    Last Alcohol Screening Score (AUDIT): 0  Housing: Unknown (12/06/2023)   Epic    Unable to Pay for Housing in the Last Year: No    Number of Times Moved in the Last Year: Not on file    Homeless in the Last Year: No  Utilities: Not At Risk (12/06/2023)   Epic    Threatened with loss of utilities: No  Health Literacy: Adequate Health Literacy (12/06/2023)   B1300 Health Literacy    Frequency of need for help with medical instructions: Never    Family History  Problem Relation Age of Onset   Diabetes Mother    Heart attack Mother    Cancer Sister        cancer of eyes, spread to lung, other places. died at 84   Cancer Sister 101       unknown  primary, spread to bone, brain died in 32's   Cancer Paternal Aunt        unk type   Cancer Paternal Grandmother        type unk   Prostate cancer Neg Hx    Kidney cancer Neg Hx    Bladder Cancer Neg Hx    Breast cancer Neg Hx     Current Medications[2]  Physical exam:  Vitals:   04/16/24 0840 04/16/24 0912  BP: (!) 147/79 133/69  Pulse: 65   Temp: 98.1 F (36.7 C)   TempSrc: Tympanic   SpO2: 97%   Weight: 110 lb (49.9 kg)   Height: 5' 1 (1.549 m)    Physical Exam Cardiovascular:     Rate and Rhythm: Normal rate and regular rhythm.     Heart sounds: Normal heart sounds.  Pulmonary:     Effort: Pulmonary effort is normal.     Breath sounds: Normal breath sounds.  Abdominal:     General: Bowel sounds are normal.     Palpations: Abdomen is soft.     Comments: There is significant erythema and excoriation noted around the PEG tube site.  Gastric contents are pouring out around the PEG tube  Skin:    General: Skin is warm and dry.  Neurological:     Mental Status: She is alert and oriented to person, place, and time.      I have personally reviewed labs listed below:    Latest Ref Rng & Units 04/16/2024    8:50 AM  CMP  Glucose 70 - 99 mg/dL 893   BUN 8 - 23 mg/dL 20   Creatinine 9.55 - 1.00 mg/dL 9.25   Sodium 864 - 854 mmol/L 140   Potassium 3.5 - 5.1 mmol/L 4.2   Chloride 98 -  111 mmol/L 104   CO2 22 - 32 mmol/L 27   Calcium  8.9 - 10.3 mg/dL 9.2   Total Protein 6.5 - 8.1 g/dL 5.9   Total Bilirubin 0.0 - 1.2 mg/dL 0.4   Alkaline Phos 38 - 126 U/L 84   AST 15 - 41 U/L 45   ALT 0 - 44 U/L 42       Latest Ref Rng & Units 04/16/2024    8:50 AM  CBC  WBC 4.0 - 10.5 K/uL 9.5   Hemoglobin 12.0 - 15.0 g/dL 87.6   Hematocrit 63.9 - 46.0 % 38.2   Platelets 150 - 400 K/uL 198    I have personally reviewed Radiology images listed below:   DG Chest 2 View Result Date: 04/01/2024 EXAM: 2 VIEW(S) XRAY OF THE CHEST 04/01/2024 02:32:00 PM COMPARISON: Prior  chest radiograph of 07/21/2023. PET of 12/27/2023. CLINICAL HISTORY: SHOB FINDINGS: LINES, TUBES AND DEVICES: Right Port-A-Cath tip at superior cavoatrial junction. LUNGS AND PLEURA: Left perihilar soft tissue fullness is new or increased since the prior plain film. Subtle pulmonary nodules including up to 11 mm in the left upper lobe. No pleural effusion. No pneumothorax. HEART AND MEDIASTINUM: Mild cardiomegaly. Atherosclerosis in the transverse aorta. BONES AND SOFT TISSUES: Remote left rib fractures. IMPRESSION: 1. Left perihilar soft tissue fullness, new or increased since the prior plain film, suspicious for mass/adenopathy. A component of this could be treatment related given known non-small cell lung cancer. 2. Pulmonary nodules, as before. Electronically signed by: Rockey Kilts MD 04/01/2024 03:25 PM EST RP Workstation: [id number]     Assessment and plan- Patient is a 68 y.o. female with history of metastatic adenocarcinoma of the lung with bilateral lung and bone metastases and atypical eGFR mutation currently on a afatinib  here for a routine follow-up  Assessment and Plan    Metastatic right lung cancer with bone metastases (EGFR-mutated) Stage IV right lung cancer with bone metastases and an uncommon EGFR mutation. She has been on afatinib  since February 12, 2024, with good tolerance, experiencing only mild xerosis and nail changes. Disease status will be reassessed after three months of therapy. - Continue afatinib . - Order PET scan at end of January to assess response. - Continue Zometa  infusion every three months; next infusion scheduled for end of January.  PEG tube dependence with dysphagia PEG tube dependence persists due to dysphagia, though she is able to take some oral intake. The PEG site is fragile and prone to bleeding, likely from skin fragility and tube movement. The tube remains necessary for medication administration and nutritional support. - Continue PEG tube for  nutritional and medication support. - Monitor PEG tube site for bleeding and skin integrity.  We have also reached out to Dr. Jordis and he will see her tomorrow regarding her PEG tube site excoriation  Neoplasm-related pain Malignancy-associated pain is well controlled with fentanyl  and oxycodone . - Refilled fentanyl  and oxycodone  prescriptions for one month.  Antibiotic-associated diarrhea She developed diarrhea following a recent antibiotic course for bronchitis, completed two days ago. - Recommended over-the-counter loperamide as needed for diarrhea.         Visit Diagnosis 1. Primary malignant neoplasm of right lung metastatic to other site [facility])   2. High risk medication use   3. Neoplasm related pain      Dr. Annah Skene, MD, MPH [facility] [phone] 04/16/2024 9:45 AM                   [  1]  Allergies Allergen Reactions   Sulfa  Antibiotics Other (See Comments)    Mouth sore and raw  [2]  Current Outpatient Medications:    albuterol  (VENTOLIN  HFA) 108 (90 Base) MCG/ACT inhaler, Inhale 2 puffs into the lungs every 6 (six) hours as needed for wheezing or shortness of breath., Disp: 51 each, Rfl: 2   apixaban  (ELIQUIS ) 5 MG TABS tablet, Take 1 tablet (5 mg total) by mouth 2 (two) times daily., Disp: 60 tablet, Rfl: 3   benzonatate  (TESSALON ) 100 MG capsule, Take 2 capsules (200 mg total) by mouth 3 (three) times daily as needed for cough., Disp: , Rfl:    bisacodyl  (DULCOLAX) 10 MG suppository, Place 1 suppository (10 mg total) rectally daily as needed for moderate constipation or severe constipation., Disp: , Rfl:    buPROPion  (WELLBUTRIN ) 75 MG tablet, Take 75 mg by mouth 2 (two) times daily., Disp: , Rfl:    busPIRone  (BUSPAR ) 5 MG tablet, Take 1 tablet (5 mg total) by mouth 3 (three) times daily as needed., Disp: 270 tablet, Rfl: 2   cefdinir  (OMNICEF ) 300 MG capsule, Take 1 capsule (300 mg total) by mouth 2 (two) times daily.,  Disp: 20 capsule, Rfl: 0   cyclobenzaprine  (FLEXERIL ) 5 MG tablet, Take 5 mg by mouth 3 (three) times daily as needed for muscle spasms., Disp: , Rfl:    DULoxetine  (CYMBALTA ) 60 MG capsule, TAKE 1 CAPSULE BY MOUTH DAILY, Disp: 90 capsule, Rfl: 0   fentaNYL  (DURAGESIC ) 12 MCG/HR, Place 1 patch onto the skin every 3 (three) days., Disp: 2 patch, Rfl: 0   fluticasone  (FLONASE ) 50 MCG/ACT nasal spray, Place 2 sprays into both nostrils daily., Disp: , Rfl:    Fluticasone -Umeclidin-Vilant (TRELEGY ELLIPTA ) 100-62.5-25 MCG/ACT AEPB, Inhale 1 Act into the lungs daily., Disp: 1 each, Rfl: 5   Fluticasone -Umeclidin-Vilant (TRELEGY ELLIPTA ) 100-62.5-25 MCG/ACT AEPB, Inhale 1 puff into the lungs daily., Disp: , Rfl:    GILOTRIF  30 MG tablet, TAKE 1 TABLET DAILY ON AN EMPTY STOMACH AT LEAST 1 HOUR BEFORE OR 2 HOURS AFTER MEALS, Disp: 30 tablet, Rfl: 1   ipratropium-albuterol  (DUONEB) 0.5-2.5 (3) MG/3ML SOLN, Take 3 mLs by nebulization every 4 (four) hours as needed., Disp: , Rfl:    lactulose , encephalopathy, (CHRONULAC ) 10 GM/15ML SOLN, Place 30 mLs (20 g total) into feeding tube 2 (two) times daily., Disp: 946 mL, Rfl: 3   lidocaine  (LIDODERM ) 5 %, Place 1 patch onto the skin daily. Remove & Discard patch within 12 hours or as directed by MD, Disp: 30 patch, Rfl: 2   meloxicam  (MOBIC ) 7.5 MG tablet, Take 1 tablet (7.5 mg total) by mouth daily., Disp: 90 tablet, Rfl: 1   metoprolol  succinate (TOPROL -XL) 25 MG 24 hr tablet, TAKE 1 TABLET BY MOUTH DAILY, Disp: 30 tablet, Rfl: 4   montelukast  (SINGULAIR ) 10 MG tablet, TAKE 1 TABLET BY MOUTH AT BEDTIME, Disp: 90 tablet, Rfl: 0   Nutritional Supplements (NUTREN 1.5) LIQD, Give 1 carton 4 times a day (8am, noon, 4pm and 8pm). Flush with 60ml of water  before and after each feeding.  Give additional 1 1/2 cups of water  a day via tube or drink orally for adequate hydration., Disp: , Rfl:    omeprazole  (KONVOMEP ) 2 mg/mL SUSP oral suspension, Take 20 mLs (40 mg total) by  mouth daily., Disp: 240 mL, Rfl: 1   ondansetron  (ZOFRAN ) 4 MG tablet, Take 1 tablet (4 mg total) by mouth every 6 (six) hours as needed for nausea or vomiting.,  Disp: , Rfl:    ondansetron  (ZOFRAN -ODT) 4 MG disintegrating tablet, Take 1 tablet (4 mg total) by mouth every 8 (eight) hours as needed for nausea or vomiting., Disp: 20 tablet, Rfl: 0   oxyCODONE  (ROXICODONE ) 5 MG/5ML solution, Take 5 mLs (5 mg total) by mouth every 8 (eight) hours as needed for severe pain (pain score 7-10)., Disp: 90 mL, Rfl: 0   predniSONE  (DELTASONE ) 20 MG tablet, Take 1 tablet (20 mg total) by mouth daily with breakfast. 7 days, Disp: 7 tablet, Rfl: 1   predniSONE  (DELTASONE ) 20 MG tablet, Take 1 tablet (20 mg total) by mouth daily with breakfast for 7 days., Disp: 7 tablet, Rfl: 0   pregabalin (LYRICA) 25 MG capsule, Take 1 capsule (25 mg total) by mouth 2 (two) times daily., Disp: 60 capsule, Rfl: 0   rOPINIRole  (REQUIP ) 0.5 MG tablet, Take 1 tablet (0.5 mg total) by mouth 3 (three) times daily., Disp: 90 tablet, Rfl: 1   rosuvastatin  (CRESTOR ) 40 MG tablet, PLACE 1 TABLET (40 MG TOTAL) INTO FEEDING TUBE DAILY, Disp: 90 tablet, Rfl: 0   senna (SENOKOT) 8.6 MG TABS tablet, Place 1 tablet (8.6 mg total) into feeding tube daily., Disp: , Rfl:    sucralfate  (CARAFATE ) 1 GM/10ML suspension, Place 10 mLs (1 g total) into feeding tube 4 (four) times daily., Disp: 1200 mL, Rfl: 2   tolterodine  (DETROL  LA) 4 MG 24 hr capsule, Take 4 mg by mouth daily., Disp: , Rfl:    triamcinolone  cream (KENALOG ) 0.1 %, Apply 1 Application topically 2 (two) times daily., Disp: , Rfl:    Water  For Irrigation, Sterile (FREE WATER ) SOLN, Place 60 mLs into feeding tube 5 (five) times daily., Disp: , Rfl:  No current facility-administered medications for this visit.  Facility-Administered Medications Ordered in Other Visits:    cyanocobalamin  (VITAMIN B12) injection 1,000 mcg, 1,000 mcg, Intramuscular, Q30 days, Melanee Annah BROCKS, MD, 1,000 mcg at  03/16/22 1101   cyanocobalamin  (VITAMIN B12) injection 1,000 mcg, 1,000 mcg, Intramuscular, Q30 days, Melanee Annah BROCKS, MD

## 2024-04-16 NOTE — Progress Notes (Signed)
 Nutrition  Unable to see patient in clinic today.    Called patient later for nutrition follow-up.  Unable to reach or leave a voicemail due to mailbox being full.    Will follow up at next clinic visit.  Runa Whittingham B. Dasie SOLON, CSO, LDN Registered Dietitian [phone]

## 2024-04-16 NOTE — Progress Notes (Signed)
 Palliative Medicine [facility] at [facility] [phone] [phone]   Name: Denise Watts Date: 04/16/2024 MRN: [id number]  DOB: [birth year]  Patient Care Team: [name], DO as PCP - General (Family Medicine) Darliss Rogue, MD as PCP - Cardiology (Cardiology) Melanee Annah BROCKS, MD as Consulting Physician (Oncology) Jordis Laneta FALCON, MD as Consulting Physician (General Surgery) Dasher, Alm LABOR, MD (Dermatology) Scheeler, Donnice PARAS, PA-C (Inactive) as Physician Assistant (Plastic Surgery) Dorothyann Drivers, MD as Attending Physician (Emergency Medicine) Ferrel, Lionel Sotero RIGGERS (Neurology) French Mering, MD as Referring Physician (Internal Medicine) Shellia Oh, MD as Consulting Physician (Pulmonary Disease) Pa, [facility] (Optometry) Verdene Gills, RN as Oncology Nurse Navigator Lenn Aran, MD as Consulting Physician (Radiation Oncology) Moises, Wanda, RN as [facility], [place], LCSW as [facility], [place], [place]    REASON FOR CONSULTATION: Denise Watts is a 68 y.o. 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  Palliative care was consulted to address goals.  SOCIAL HISTORY:     reports that she quit smoking about 28 years ago. Her smoking use included cigarettes. She started smoking about 58 years ago. She has a 45 pack-year smoking history. She has been exposed to tobacco smoke. She has never used smokeless tobacco. She reports that she does not currently use alcohol after a past usage of about 4.0 standard drinks of alcohol per week. She reports that she does not use drugs.  Patient married lives at home with her husband.  She has a son and three daughters.  Patient  previously worked in designer, fashion/clothing.  ADVANCE DIRECTIVES:    CODE STATUS:   PAST MEDICAL HISTORY: Past Medical History:  Diagnosis Date   Acute pulmonary embolism (HCC) 05/10/2023   Adenomatous colon polyp    Adrenal carcinoma, right (HCC) 12/28/2016   a.) high grade carcinoma with extensive necrosis --> s/p adrenalectomy   Anxiety    Aortic atherosclerosis    Arthritis of right knee    Atrial fibrillation and flutter (HCC)    a.) CHA2DS2-VASc = 3 (age, sex, vascular disease history) as of 02/13/2023; b.) cardiac rate/rhythm maintained intrinsically without pharmacological intervention; no chronic OAC (does take low dose ASA)   B12 deficiency    Breast cancer (HCC) 1997   a.) stage IB (cT1a, cN0, cM0, G3, ER+, PR-, HER2-); s/p lumpectomy + XRT   COPD (chronic obstructive pulmonary disease) (HCC)    Coronary artery disease    a.) cCTA 12/09/2021: Ca2+ = 135 (80th %'ile for age/sex/race match contol) --> 25-49% pLAD and RCA distributions   Depression    Diastolic dysfunction    a.) TTE 12/11/2020, no RWMAs, norm RVSF, G1DD; b.) TTE 12/14/2021: EF 55-60, no RWMAs, norm RVSF, G1DD; c.) TTE 01/27/2023: EF 50-55%, no RWMAs, G1DD, mild RAE, mild MR, mod TR   Dry cough 03/02/2017   Dyspnea    Full dentures    GERD (gastroesophageal reflux disease)    History of hiatal hernia    HLD (hyperlipidemia)    Invasive carcinoma of breast (HCC) 11/15/2017   a.) path (+) for G3, ER -, PR -, HER2/neu - ; s/p lumpectomy 01/17/2018 + 4 cycles TC systemic chemotherapy + XRT   Long term current use of aspirin     Personal history of  chemotherapy    Personal history of radiation therapy    Pneumothorax of left lung after biopsy 01/25/2023   a.) hypoxic in PACU requiring re-intubation --> transferred to ICU --> chest tube placed by PCCM   RLS (restless legs syndrome)    a.) on ropinirole    Skin cancer    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   a.) Stage IB (cT1a, cN0, cM0, G3, ER+, PR-,  HER2-)    PAST SURGICAL HISTORY:  Past Surgical History:  Procedure Laterality Date   BIOPSY  01/05/2023   Procedure: BIOPSY;  Surgeon: Unk Corinn Skiff, MD;  Location: [facility] ENDOSCOPY;  Service: Gastroenterology;;   BREAST BIOPSY Right 1997   positive   BREAST BIOPSY Right 11/15/2017   INVASIVE MAMMARY CARCINOMA triple negative   BREAST BIOPSY Right 07/20/2020   us  bx, Q marker, neg   BREAST LUMPECTOMY Right 1997   2019 also   BREAST LUMPECTOMY WITH SENTINEL LYMPH NODE BIOPSY Right 12/08/2017   Procedure: BREAST LUMPECTOMY WITH SENTINEL LYMPH NODE BX;  Surgeon: Dessa Reyes ORN, MD;  Location: [facility] ORS;  Service: General;  Laterality: Right;   BREAST REDUCTION WITH MASTOPEXY Left 06/10/2019   Procedure: Left breast mastopexy reduction for symmetry;  Surgeon: Lowery Estefana RAMAN, DO;  Location: [facility];  Service: Plastics;  Laterality: Left;  total 2.5 hours   CATARACT EXTRACTION W/PHACO Right 02/07/2022   Procedure: CATARACT EXTRACTION PHACO AND INTRAOCULAR LENS PLACEMENT (IOC) RIGHT DIABETIC;  Surgeon: Myrna Adine Anes, MD;  Location: [facility];  Service: Ophthalmology;  Laterality: Right;  3.60 00:34.7   CATARACT EXTRACTION W/PHACO Left 02/28/2022   Procedure: CATARACT EXTRACTION PHACO AND INTRAOCULAR LENS PLACEMENT (IOC) LEFT DIABETIC 4.11 00:26.1;  Surgeon: Myrna Adine Anes, MD;  Location: [facility];  Service: Ophthalmology;  Laterality: Left;   COLONOSCOPY  2015   COLONOSCOPY N/A 01/05/2023   Procedure: COLONOSCOPY;  Surgeon: Unk Corinn Skiff, MD;  Location: [facility] END[facility]COPY;  Service: Gastroenterology;  Laterality: N/A;   COLONOSCOPY WITH PROPOFOL  N/A 10/29/2019   Procedure: COLONOSCOPY WITH PROPOFOL ;  Surgeon: Jinny Carmine, MD;  Location: [facility] ENDOSCOPY;  Service: Endoscopy;  Laterality: N/A;   ESOPHAGOGASTRODUODENOSCOPY (EGD) WITH PROPOFOL  N/A 05/24/2019   Procedure: ESOPHAGOGASTRODUODENOSCOPY (EGD) WITH PROPOFOL ;  Surgeon: Jinny Carmine, MD;  Location: [facility];  Service: Endoscopy;  Laterality: N/A;   ESOPHAGOGASTRODUODENOSCOPY (EGD) WITH PROPOFOL  N/A 09/06/2022   Procedure: ESOPHAGOGASTRODUODENOSCOPY (EGD) WITH PROPOFOL ;  Surgeon: Jinny Carmine, MD;  Location: [facility];  Service: Endoscopy;  Laterality: N/A;   ESOPHAGOGASTRODUODENOSCOPY (EGD) WITH PROPOFOL   01/05/2023   Procedure: ESOPHAGOGASTRODUODENOSCOPY (EGD) WITH PROPOFOL ;  Surgeon: Unk Corinn Skiff, MD;  Location: [facility];  Service: Gastroenterology;;   ESOPHAGUS SURGERY     GASTROSTOMY N/A 05/25/2023   Procedure: OPEN INSERTION OF GASTROSTOMY TUBE;  Surgeon: Jordis Laneta FALCON, MD;  Location: [facility];  Service: General;  Laterality: N/A;   IR IMAGING GUIDED PORT INSERTION  02/07/2023   IR REPLACE G-TUBE SIMPLE WO FLUORO  02/27/2024   IR REPLC GASTRO/COLONIC TUBE PERCUT W/FLUORO  09/12/2023   KNEE SURGERY Right    LIPOSUCTION WITH LIPOFILLING Right 06/10/2019   Procedure: right breast scar and capsule release with fat grafting;  Surgeon: Lowery Estefana RAMAN, DO;  Location: [facility];  Service: Plastics;  Laterality: Right;   POLYPECTOMY  01/05/2023   Procedure: POLYPECTOMY;  Surgeon: Unk Corinn Skiff, MD;  Location: [facility];  Service: Gastroenterology;;   PORT-A-CATH REMOVAL     PORTACATH PLACEMENT Right  01/17/2018   Procedure: INSERTION PORT-A-CATH- RIGHT;  Surgeon: Dessa Reyes ORN, MD;  Location: [facility];  Service: General;  Laterality: Right;   PULMONARY THROMBECTOMY Bilateral 05/12/2023   Procedure: PULMONARY THROMBECTOMY;  Surgeon: Marea Selinda RAMAN, MD;  Location: [facility];  Service: Cardiovascular;  Laterality: Bilateral;   ROBOTIC ADRENALECTOMY Right 12/28/2016   Procedure: ROBOTIC ADRENALECTOMY;  Surgeon: Penne Knee, MD;  Location: [facility];  Service: Urology;  Laterality: Right;   SKIN CANCER EXCISION     THROAT SURGERY  1998   throat cancer    TUBAL LIGATION     VENTRAL HERNIA REPAIR N/A  03/03/2017   Procedure: HERNIA REPAIR VENTRAL ADULT;  Surgeon: Shelva Dunnings, MD;  Location: [facility];  Service: General;  Laterality: N/A;   VIDEO BRONCHOSCOPY WITH ENDOBRONCHIAL ULTRASOUND N/A 01/25/2023   Procedure: VIDEO BRONCHOSCOPY WITH ENDOBRONCHIAL ULTRASOUND;  Surgeon: Parris Manna, MD;  Location: [facility];  Service: Thoracic;  Laterality: N/A;   VIDEO BRONCHOSCOPY WITH ENDOBRONCHIAL ULTRASOUND N/A 02/17/2023   Procedure: VIDEO BRONCHOSCOPY WITH ENDOBRONCHIAL ULTRASOUND;  Surgeon: Parris Manna, MD;  Location: [facility];  Service: Thoracic;  Laterality: N/A;    HEMATOLOGY/ONCOLOGY HISTORY:  Oncology History Overview Note  1. Carcinoma of breast, T1, N0, M0 tumor diagnosis.  In January of 1997 2. Carcinoma of the vocal cord T1, N0, M0 tumor.  Status postradiation therapy 3. Abnormal CT scan of the chest (December, 2015) 4. Repeat CT scan of chest shows stable nodule (July, 2016) 5. Neutropenia with normal hemoglobin and normal platelet count (July, 2016)   Cancer of vocal cord [facility]) (Resolved)  11/10/2014 Initial Diagnosis   Cancer of vocal cord   Malignant neoplasm of lower-outer quadrant of right breast of female, estrogen receptor negative (HCC)  11/24/2017 Initial Diagnosis   Malignant neoplasm of lower-outer quadrant of right breast of female, estrogen receptor negative (HCC)   01/12/2018 Cancer Staging   Staging form: Breast, AJCC 8th Edition - Clinical stage from 01/12/2018: Stage IB (cT1a, cN0, cM0, G3, ER+, PR-, HER2-) - Signed by Melanee Annah BROCKS, MD on 01/12/2018   01/30/2018 - 04/26/2018 Chemotherapy   The patient had dexamethasone  (DECADRON ) 4 MG tablet, 8 mg, Oral, 2 times daily, 1 of 1 cycle, Start date: 01/12/2018, End date: 08/03/2018 palonosetron  (ALOXI ) injection 0.25 mg, 0.25 mg, Intravenous,  Once, 4 of 4 cycles Administration: 0.25 mg (01/30/2018), 0.25 mg (02/27/2018), 0.25 mg (03/28/2018), 0.25 mg (04/26/2018) pegfilgrastim  (NEULASTA  ONPRO KIT) injection 6  mg, 6 mg, Subcutaneous, Once, 4 of 4 cycles Administration: 6 mg (01/30/2018), 6 mg (02/27/2018), 6 mg (03/28/2018), 6 mg (04/26/2018) cyclophosphamide  (CYTOXAN ) 1,000 mg in sodium chloride  0.9 % 250 mL chemo infusion, 600 mg/m2 = 1,000 mg, Intravenous,  Once, 4 of 4 cycles Administration: 1,000 mg (01/30/2018), 1,000 mg (02/27/2018), 1,000 mg (03/28/2018), 1,000 mg (04/26/2018) DOCEtaxel  (TAXOTERE ) 130 mg in sodium chloride  0.9 % 250 mL chemo infusion, 75 mg/m2 = 130 mg, Intravenous,  Once, 4 of 4 cycles Dose modification: 60 mg/m2 (original dose 75 mg/m2, Cycle 2, Reason: Dose not tolerated) Administration: 130 mg (01/30/2018), 100 mg (02/27/2018), 100 mg (03/28/2018), 100 mg (04/26/2018)  for chemotherapy treatment.    Non-small cell lung cancer (HCC)  03/09/2023 Initial Diagnosis   Non-small cell lung cancer (HCC)   03/20/2023 -  Chemotherapy   Patient is on Treatment Plan : LUNG Carboplatin  + Paclitaxel  q7d     Primary malignant neoplasm of right lung metastatic to other site [facility])  01/22/2024 Initial Diagnosis   Primary malignant neoplasm  of right lung metastatic to other site [facility])   01/22/2024 Cancer Staging   Staging form: Lung, AJCC V9 - Clinical stage from 01/22/2024: rcT4, rcN3, rpM1 - Signed by Melanee Annah BROCKS, MD on 01/22/2024 Stage prefix: Recurrence     ALLERGIES:  is allergic to sulfa  antibiotics.  MEDICATIONS:  Current Outpatient Medications  Medication Sig Dispense Refill   albuterol  (VENTOLIN  HFA) 108 (90 Base) MCG/ACT inhaler Inhale 2 puffs into the lungs every 6 (six) hours as needed for wheezing or shortness of breath. 51 each 2   apixaban  (ELIQUIS ) 5 MG TABS tablet Take 1 tablet (5 mg total) by mouth 2 (two) times daily. 60 tablet 3   benzonatate  (TESSALON ) 100 MG capsule Take 2 capsules (200 mg total) by mouth 3 (three) times daily as needed for cough.     bisacodyl  (DULCOLAX) 10 MG suppository Place 1 suppository (10 mg total) rectally daily as needed for moderate  constipation or severe constipation.     buPROPion  (WELLBUTRIN ) 75 MG tablet Take 75 mg by mouth 2 (two) times daily.     busPIRone  (BUSPAR ) 5 MG tablet Take 1 tablet (5 mg total) by mouth 3 (three) times daily as needed. 270 tablet 2   cefdinir  (OMNICEF ) 300 MG capsule Take 1 capsule (300 mg total) by mouth 2 (two) times daily. 20 capsule 0   cyclobenzaprine  (FLEXERIL ) 5 MG tablet Take 5 mg by mouth 3 (three) times daily as needed for muscle spasms.     DULoxetine  (CYMBALTA ) 60 MG capsule TAKE 1 CAPSULE BY MOUTH DAILY 90 capsule 0   fentaNYL  (DURAGESIC ) 12 MCG/HR Place 1 patch onto the skin every 3 (three) days. 2 patch 0   fluticasone  (FLONASE ) 50 MCG/ACT nasal spray Place 2 sprays into both nostrils daily.     Fluticasone -Umeclidin-Vilant (TRELEGY ELLIPTA ) 100-62.5-25 MCG/ACT AEPB Inhale 1 Act into the lungs daily. 1 each 5   Fluticasone -Umeclidin-Vilant (TRELEGY ELLIPTA ) 100-62.5-25 MCG/ACT AEPB Inhale 1 puff into the lungs daily.     GILOTRIF  30 MG tablet TAKE 1 TABLET DAILY ON AN EMPTY STOMACH AT LEAST 1 HOUR BEFORE OR 2 HOURS AFTER MEALS 30 tablet 1   ipratropium-albuterol  (DUONEB) 0.5-2.5 (3) MG/3ML SOLN Take 3 mLs by nebulization every 4 (four) hours as needed.     lactulose , encephalopathy, (CHRONULAC ) 10 GM/15ML SOLN Place 30 mLs (20 g total) into feeding tube 2 (two) times daily. 946 mL 3   lidocaine  (LIDODERM ) 5 % Place 1 patch onto the skin daily. Remove & Discard patch within 12 hours or as directed by MD 30 patch 2   meloxicam  (MOBIC ) 7.5 MG tablet Take 1 tablet (7.5 mg total) by mouth daily. 90 tablet 1   metoprolol  succinate (TOPROL -XL) 25 MG 24 hr tablet TAKE 1 TABLET BY MOUTH DAILY 30 tablet 4   montelukast  (SINGULAIR ) 10 MG tablet TAKE 1 TABLET BY MOUTH AT BEDTIME 90 tablet 0   Nutritional Supplements (NUTREN 1.5) LIQD Give 1 carton 4 times a day (8am, noon, 4pm and 8pm). Flush with 60ml of water  before and after each feeding.  Give additional 1 1/2 cups of water  a day via tube  or drink orally for adequate hydration.     omeprazole  (KONVOMEP ) 2 mg/mL SUSP oral suspension Take 20 mLs (40 mg total) by mouth daily. 240 mL 1   ondansetron  (ZOFRAN ) 4 MG tablet Take 1 tablet (4 mg total) by mouth every 6 (six) hours as needed for nausea or vomiting.     ondansetron  (ZOFRAN -ODT) 4 MG  disintegrating tablet Take 1 tablet (4 mg total) by mouth every 8 (eight) hours as needed for nausea or vomiting. 20 tablet 0   oxyCODONE  (ROXICODONE ) 5 MG/5ML solution Take 5 mLs (5 mg total) by mouth every 8 (eight) hours as needed for severe pain (pain score 7-10). 90 mL 0   predniSONE  (DELTASONE ) 20 MG tablet Take 1 tablet (20 mg total) by mouth daily with breakfast. 7 days 7 tablet 1   predniSONE  (DELTASONE ) 20 MG tablet Take 1 tablet (20 mg total) by mouth daily with breakfast for 7 days. 7 tablet 0   pregabalin (LYRICA) 25 MG capsule Take 1 capsule (25 mg total) by mouth 2 (two) times daily. 60 capsule 0   rOPINIRole  (REQUIP ) 0.5 MG tablet Take 1 tablet (0.5 mg total) by mouth 3 (three) times daily. 90 tablet 1   rosuvastatin  (CRESTOR ) 40 MG tablet PLACE 1 TABLET (40 MG TOTAL) INTO FEEDING TUBE DAILY 90 tablet 0   senna (SENOKOT) 8.6 MG TABS tablet Place 1 tablet (8.6 mg total) into feeding tube daily.     sucralfate  (CARAFATE ) 1 GM/10ML suspension Place 10 mLs (1 g total) into feeding tube 4 (four) times daily. 1200 mL 2   tolterodine  (DETROL  LA) 4 MG 24 hr capsule Take 4 mg by mouth daily.     triamcinolone  cream (KENALOG ) 0.1 % Apply 1 Application topically 2 (two) times daily.     Water  For Irrigation, Sterile (FREE WATER ) SOLN Place 60 mLs into feeding tube 5 (five) times daily.     No current facility-administered medications for this visit.   Facility-Administered Medications Ordered in Other Visits  Medication Dose Route Frequency Provider Last Rate Last Admin   cyanocobalamin  (VITAMIN B12) injection 1,000 mcg  1,000 mcg Intramuscular Q30 days Rao, Archana C, MD   1,000 mcg at  03/16/22 1101   cyanocobalamin  (VITAMIN B12) injection 1,000 mcg  1,000 mcg Intramuscular Q30 days Melanee Annah BROCKS, MD        VITAL SIGNS: There were no vitals taken for this visit. There were no vitals filed for this visit.  Estimated body mass index is 20.78 kg/m as calculated from the following:   Height as of an earlier encounter on 04/16/24: 5' 1 (1.549 m).   Weight as of an earlier encounter on 04/16/24: 110 lb (49.9 kg).  LABS: CBC:    Component Value Date/Time   WBC 9.5 04/16/2024 0850   WBC 8.6 04/01/2024 1408   HGB 12.3 04/16/2024 0850   HGB 14.0 11/03/2021 1551   HCT 38.2 04/16/2024 0850   HCT 42.1 11/03/2021 1551   PLT 198 04/16/2024 0850   PLT 216 11/03/2021 1551   MCV 97.9 04/16/2024 0850   MCV 97 11/03/2021 1551   MCV 100 04/08/2014 0409   NEUTROABS 7.7 04/16/2024 0850   NEUTROABS 3.5 11/03/2021 1551   NEUTROABS 6.1 04/08/2014 0409   LYMPHSABS 1.0 04/16/2024 0850   LYMPHSABS 1.2 11/03/2021 1551   LYMPHSABS 0.8 (L) 04/08/2014 0409   MONOABS 0.6 04/16/2024 0850   MONOABS 0.7 04/08/2014 0409   EOSABS 0.1 04/16/2024 0850   EOSABS 0.2 11/03/2021 1551   EOSABS 0.0 04/08/2014 0409   BASOSABS 0.0 04/16/2024 0850   BASOSABS 0.0 11/03/2021 1551   BASOSABS 0.0 04/08/2014 0409   Comprehensive Metabolic Panel:    Component Value Date/Time   NA 140 04/16/2024 0850   NA 145 (H) 11/03/2021 1551   NA 143 04/08/2014 0409   K 4.2 04/16/2024 0850   K 3.5  04/08/2014 0409   CL 104 04/16/2024 0850   CL 113 (H) 04/08/2014 0409   CO2 27 04/16/2024 0850   CO2 26 04/08/2014 0409   BUN 20 04/16/2024 0850   BUN 13 11/03/2021 1551   BUN 6 (L) 04/08/2014 0409   CREATININE 0.74 04/16/2024 0850   CREATININE 0.67 04/08/2014 0409   GLUCOSE 106 (H) 04/16/2024 0850   GLUCOSE 84 04/08/2014 0409   CALCIUM  9.2 04/16/2024 0850   CALCIUM  7.5 (L) 04/08/2014 0409   AST 45 (H) 04/16/2024 0850   ALT 42 04/16/2024 0850   ALT 21 11/26/2012 1019   ALKPHOS 84 04/16/2024 0850   ALKPHOS  87 11/26/2012 1019   BILITOT 0.4 04/16/2024 0850   PROT 5.9 (L) 04/16/2024 0850   PROT 6.3 11/03/2021 1551   PROT 6.4 11/26/2012 1019   ALBUMIN  3.6 04/16/2024 0850   ALBUMIN  4.1 11/03/2021 1551   ALBUMIN  3.5 11/26/2012 1019    RADIOGRAPHIC STUDIES: DG Chest 2 View Result Date: 04/01/2024 EXAM: 2 VIEW(S) XRAY OF THE CHEST 04/01/2024 02:32:00 PM COMPARISON: Prior chest radiograph of 07/21/2023. PET of 12/27/2023. CLINICAL HISTORY: SHOB FINDINGS: LINES, TUBES AND DEVICES: Right Port-A-Cath tip at superior cavoatrial junction. LUNGS AND PLEURA: Left perihilar soft tissue fullness is new or increased since the prior plain film. Subtle pulmonary nodules including up to 11 mm in the left upper lobe. No pleural effusion. No pneumothorax. HEART AND MEDIASTINUM: Mild cardiomegaly. Atherosclerosis in the transverse aorta. BONES AND SOFT TISSUES: Remote left rib fractures. IMPRESSION: 1. Left perihilar soft tissue fullness, new or increased since the prior plain film, suspicious for mass/adenopathy. A component of this could be treatment related given known non-small cell lung cancer. 2. Pulmonary nodules, as before. Electronically signed by: Rockey Kilts MD 04/01/2024 03:25 PM EST RP Workstation: [id number]    PERFORMANCE STATUS (ECOG) : 2 - Symptomatic, <50% confined to bed  Review of Systems Unless otherwise noted, a complete review of systems is negative.  Physical Exam General: NAD, thin appearing Pulmonary: Unlabored Extremities: no edema, no joint deformities Skin: no rashes Neurological: Weakness but otherwise nonfocal  IMPRESSION: Follow-up visit.  She is accompanied by her husband.  Patient reports significant pain and excoriation around her PEG.  She was seen recently by Dr. Jordis for same. Reached out to Dr. Jordis today for recommendations.   Otherwise, patient says that she is doing okay.  She has pain but has been unable to get her fentanyl  filled by pharmacy.  Says she is  considering changing to [facility] pharmacy.  Patient previously given a MOST form but does not recall.  Says she is still thinking about decision making.  PLAN: - Continue current scope of treatment - MOST Form previously reviewed - RTC 1 month  Case and plan discussed with Dr. Melanee  Patient expressed understanding and was in agreement with this plan. She also understands that She can call the clinic at any time with any questions, concerns, or complaints.     Time Total: 15 minutes  Visit consisted of counseling and education dealing with the complex and emotionally intense issues of symptom management and palliative care in the setting of serious and potentially life-threatening illness.Greater than 50%  of this time was spent counseling and coordinating care related to the above assessment and plan.  Signed by: Fonda Mower, PhD, NP-C

## 2024-04-17 ENCOUNTER — Other Ambulatory Visit: Payer: Self-pay | Admitting: Internal Medicine

## 2024-04-17 ENCOUNTER — Encounter: Payer: Self-pay | Admitting: Surgery

## 2024-04-17 ENCOUNTER — Ambulatory Visit: Admitting: Surgery

## 2024-04-17 VITALS — BP 98/64 | HR 76 | Temp 97.5°F | Ht 61.0 in | Wt 109.0 lb

## 2024-04-17 DIAGNOSIS — Z9889 Other specified postprocedural states: Secondary | ICD-10-CM

## 2024-04-17 DIAGNOSIS — Z431 Encounter for attention to gastrostomy: Secondary | ICD-10-CM

## 2024-04-17 DIAGNOSIS — I2692 Saddle embolus of pulmonary artery without acute cor pulmonale: Secondary | ICD-10-CM

## 2024-04-17 DIAGNOSIS — C3490 Malignant neoplasm of unspecified part of unspecified bronchus or lung: Secondary | ICD-10-CM | POA: Diagnosis not present

## 2024-04-17 NOTE — Patient Instructions (Signed)
 We will see you back in about 2 weeks.

## 2024-04-18 ENCOUNTER — Other Ambulatory Visit: Payer: Self-pay

## 2024-04-18 ENCOUNTER — Emergency Department

## 2024-04-18 ENCOUNTER — Telehealth: Payer: Self-pay

## 2024-04-18 ENCOUNTER — Observation Stay
Admission: EM | Admit: 2024-04-18 | Source: Home / Self Care | Attending: Emergency Medicine | Admitting: Emergency Medicine

## 2024-04-18 ENCOUNTER — Encounter: Payer: Self-pay | Admitting: Oncology

## 2024-04-18 DIAGNOSIS — E785 Hyperlipidemia, unspecified: Secondary | ICD-10-CM | POA: Insufficient documentation

## 2024-04-18 DIAGNOSIS — R627 Adult failure to thrive: Secondary | ICD-10-CM | POA: Diagnosis not present

## 2024-04-18 DIAGNOSIS — K219 Gastro-esophageal reflux disease without esophagitis: Secondary | ICD-10-CM | POA: Diagnosis not present

## 2024-04-18 DIAGNOSIS — Z555 Less than a high school diploma: Secondary | ICD-10-CM | POA: Insufficient documentation

## 2024-04-18 DIAGNOSIS — Z6820 Body mass index (BMI) 20.0-20.9, adult: Secondary | ICD-10-CM | POA: Diagnosis not present

## 2024-04-18 DIAGNOSIS — Z853 Personal history of malignant neoplasm of breast: Secondary | ICD-10-CM | POA: Diagnosis not present

## 2024-04-18 DIAGNOSIS — I5032 Chronic diastolic (congestive) heart failure: Secondary | ICD-10-CM | POA: Insufficient documentation

## 2024-04-18 DIAGNOSIS — Z87891 Personal history of nicotine dependence: Secondary | ICD-10-CM | POA: Insufficient documentation

## 2024-04-18 DIAGNOSIS — F32A Depression, unspecified: Secondary | ICD-10-CM | POA: Diagnosis not present

## 2024-04-18 DIAGNOSIS — N281 Cyst of kidney, acquired: Secondary | ICD-10-CM | POA: Diagnosis not present

## 2024-04-18 DIAGNOSIS — K9421 Gastrostomy hemorrhage: Secondary | ICD-10-CM | POA: Diagnosis not present

## 2024-04-18 DIAGNOSIS — I2510 Atherosclerotic heart disease of native coronary artery without angina pectoris: Secondary | ICD-10-CM | POA: Diagnosis not present

## 2024-04-18 DIAGNOSIS — J449 Chronic obstructive pulmonary disease, unspecified: Secondary | ICD-10-CM | POA: Diagnosis not present

## 2024-04-18 DIAGNOSIS — G2581 Restless legs syndrome: Secondary | ICD-10-CM | POA: Insufficient documentation

## 2024-04-18 DIAGNOSIS — Z4659 Encounter for fitting and adjustment of other gastrointestinal appliance and device: Secondary | ICD-10-CM | POA: Diagnosis not present

## 2024-04-18 DIAGNOSIS — Z8521 Personal history of malignant neoplasm of larynx: Secondary | ICD-10-CM | POA: Diagnosis not present

## 2024-04-18 DIAGNOSIS — K5730 Diverticulosis of large intestine without perforation or abscess without bleeding: Secondary | ICD-10-CM | POA: Diagnosis not present

## 2024-04-18 DIAGNOSIS — I480 Paroxysmal atrial fibrillation: Secondary | ICD-10-CM | POA: Diagnosis not present

## 2024-04-18 DIAGNOSIS — Z85828 Personal history of other malignant neoplasm of skin: Secondary | ICD-10-CM | POA: Insufficient documentation

## 2024-04-18 DIAGNOSIS — E46 Unspecified protein-calorie malnutrition: Secondary | ICD-10-CM | POA: Insufficient documentation

## 2024-04-18 DIAGNOSIS — Z85858 Personal history of malignant neoplasm of other endocrine glands: Secondary | ICD-10-CM | POA: Insufficient documentation

## 2024-04-18 DIAGNOSIS — G893 Neoplasm related pain (acute) (chronic): Secondary | ICD-10-CM | POA: Diagnosis not present

## 2024-04-18 DIAGNOSIS — Z79899 Other long term (current) drug therapy: Secondary | ICD-10-CM | POA: Diagnosis not present

## 2024-04-18 DIAGNOSIS — Z86711 Personal history of pulmonary embolism: Secondary | ICD-10-CM | POA: Insufficient documentation

## 2024-04-18 DIAGNOSIS — R053 Chronic cough: Secondary | ICD-10-CM

## 2024-04-18 DIAGNOSIS — I700 Atherosclerosis of aorta: Secondary | ICD-10-CM | POA: Insufficient documentation

## 2024-04-18 DIAGNOSIS — Z515 Encounter for palliative care: Secondary | ICD-10-CM | POA: Diagnosis not present

## 2024-04-18 DIAGNOSIS — J441 Chronic obstructive pulmonary disease with (acute) exacerbation: Secondary | ICD-10-CM | POA: Insufficient documentation

## 2024-04-18 LAB — CBC WITH DIFFERENTIAL/PLATELET
Abs Immature Granulocytes: 0.04 K/uL (ref 0.00–0.07)
Basophils Absolute: 0 K/uL (ref 0.0–0.1)
Basophils Relative: 0 %
Eosinophils Absolute: 0.4 K/uL (ref 0.0–0.5)
Eosinophils Relative: 5 %
HCT: 41.5 % (ref 36.0–46.0)
Hemoglobin: 13 g/dL (ref 12.0–15.0)
Immature Granulocytes: 0 %
Lymphocytes Relative: 9 %
Lymphs Abs: 0.8 K/uL (ref 0.7–4.0)
MCH: 31.7 pg (ref 26.0–34.0)
MCHC: 31.3 g/dL (ref 30.0–36.0)
MCV: 101.2 fL — ABNORMAL HIGH (ref 80.0–100.0)
Monocytes Absolute: 0.6 K/uL (ref 0.1–1.0)
Monocytes Relative: 6 %
Neutro Abs: 7.3 K/uL (ref 1.7–7.7)
Neutrophils Relative %: 80 %
Platelets: 180 K/uL (ref 150–400)
RBC: 4.1 MIL/uL (ref 3.87–5.11)
RDW: 13 % (ref 11.5–15.5)
WBC: 9.2 K/uL (ref 4.0–10.5)
nRBC: 0 % (ref 0.0–0.2)

## 2024-04-18 LAB — BASIC METABOLIC PANEL WITH GFR
Anion gap: 7 (ref 5–15)
BUN: 22 mg/dL (ref 8–23)
CO2: 31 mmol/L (ref 22–32)
Calcium: 8.7 mg/dL — ABNORMAL LOW (ref 8.9–10.3)
Chloride: 102 mmol/L (ref 98–111)
Creatinine, Ser: 0.74 mg/dL (ref 0.44–1.00)
GFR, Estimated: >60 mL/min (ref >60)
Glucose, Bld: 147 mg/dL — ABNORMAL HIGH (ref 70–99)
Potassium: 4.4 mmol/L (ref 3.5–5.1)
Sodium: 140 mmol/L (ref 135–145)

## 2024-04-18 MED ORDER — OXYCODONE HCL 5 MG/5ML PO SOLN
5.0000 mg | Freq: Three times a day (TID) | ORAL | Status: DC | PRN
  Administered 2024-04-18 – 2024-04-19 (×2): 5 mg via ORAL
  Filled 2024-04-18 (×2): qty 5

## 2024-04-18 MED ORDER — HYDROMORPHONE HCL 1 MG/ML IJ SOLN
1.0000 mg | Freq: Once | INTRAMUSCULAR | Status: AC
  Administered 2024-04-18: 15:00:00 1 mg via INTRAVENOUS
  Filled 2024-04-18: qty 1

## 2024-04-18 MED ORDER — DULOXETINE HCL 30 MG PO CPEP
60.0000 mg | ORAL_CAPSULE | Freq: Every day | ORAL | Status: DC
  Administered 2024-04-20: 60 mg via ORAL
  Filled 2024-04-18 (×2): qty 2

## 2024-04-18 MED ORDER — IOHEXOL 300 MG/ML  SOLN
75.0000 mL | Freq: Once | INTRAMUSCULAR | Status: AC | PRN
  Administered 2024-04-18: 14:00:00 75 mL via INTRAVENOUS

## 2024-04-18 MED ORDER — MORPHINE SULFATE (PF) 4 MG/ML IV SOLN
4.0000 mg | Freq: Once | INTRAVENOUS | Status: AC
  Administered 2024-04-18: 13:00:00 4 mg via INTRAVENOUS
  Filled 2024-04-18: qty 1

## 2024-04-18 MED ORDER — BUSPIRONE HCL 10 MG PO TABS
5.0000 mg | ORAL_TABLET | Freq: Two times a day (BID) | ORAL | Status: DC
  Administered 2024-04-18: 21:00:00 5 mg
  Filled 2024-04-18: qty 1

## 2024-04-18 MED ORDER — ROPINIROLE HCL 1 MG PO TABS
0.5000 mg | ORAL_TABLET | Freq: Three times a day (TID) | ORAL | Status: DC
  Administered 2024-04-18 – 2024-04-19 (×2): 0.5 mg
  Filled 2024-04-18 (×2): qty 1

## 2024-04-18 MED ORDER — ROSUVASTATIN CALCIUM 10 MG PO TABS
40.0000 mg | ORAL_TABLET | Freq: Every day | ORAL | Status: DC
  Administered 2024-04-18: 20:00:00 40 mg
  Filled 2024-04-18: qty 4

## 2024-04-18 MED ORDER — BUDESON-GLYCOPYRROL-FORMOTEROL 160-9-4.8 MCG/ACT IN AERO
2.0000 | INHALATION_SPRAY | Freq: Two times a day (BID) | RESPIRATORY_TRACT | Status: DC
  Administered 2024-04-18 – 2024-04-20 (×4): 2 via RESPIRATORY_TRACT
  Filled 2024-04-18: qty 5.9

## 2024-04-18 MED ORDER — SILVER NITRATE-POT NITRATE 75-25 % EX MISC
4.0000 | Freq: Once | CUTANEOUS | Status: AC
  Administered 2024-04-18: 16:00:00 4 via TOPICAL
  Filled 2024-04-18: qty 10

## 2024-04-18 NOTE — Consult Note (Signed)
 [facility] SURGICAL CONSULTATION NOTE (initial) - cpt: [id number]   HISTORY OF PRESENT ILLNESS (HPI):  68 y.o. female well known to our service following placement of gastrostomy tube on 05/25/2023 secondary to malnutrition and failure to thrive secondary to an extensive malignant history including breast cancer, adrenal carcinoma, small cell lung cancer. She unfortunately has had issues with drainage around this tube. This was last exchanged in late October. She presents tot he ED with continued drainage and occasional bleeding from her gastrostomy tube. She was seen in clinic yesterday as well. Her skin is quite macerated. Labs in the ED are reassuring, Hgb 13.0. She did have a CT which showed G-tube in adequate position.   Surgery is consulted by emergency medicine physician Dr. Waylon Cassis, MD in this context for evaluation and management of gastrostomy tube issues.  PAST MEDICAL HISTORY (PMH):  Past Medical History:  Diagnosis Date   Acute pulmonary embolism (HCC) 05/10/2023   Adenomatous colon polyp    Adrenal carcinoma, right (HCC) 12/28/2016   a.) high grade carcinoma with extensive necrosis --> s/p adrenalectomy   Anxiety    Aortic atherosclerosis    Arthritis of right knee    Atrial fibrillation and flutter (HCC)    a.) CHA2DS2-VASc = 3 (age, sex, vascular disease history) as of 02/13/2023; b.) cardiac rate/rhythm maintained intrinsically without pharmacological intervention; no chronic OAC (does take low dose ASA)   B12 deficiency    Breast cancer (HCC) 1997   a.) stage IB (cT1a, cN0, cM0, G3, ER+, PR-, HER2-); s/p lumpectomy + XRT   COPD (chronic obstructive pulmonary disease) (HCC)    Coronary artery disease    a.) cCTA 12/09/2021: Ca2+ = 135 (80th %'ile for age/sex/race match contol) --> 25-49% pLAD and RCA distributions   Depression    Diastolic dysfunction    a.) TTE 12/11/2020, no RWMAs, norm RVSF, G1DD; b.) TTE 12/14/2021: EF 55-60, no RWMAs, norm  RVSF, G1DD; c.) TTE 01/27/2023: EF 50-55%, no RWMAs, G1DD, mild RAE, mild MR, mod TR   Dry cough 03/02/2017   Dyspnea    Full dentures    GERD (gastroesophageal reflux disease)    History of hiatal hernia    HLD (hyperlipidemia)    Invasive carcinoma of breast (HCC) 11/15/2017   a.) path (+) for G3, ER -, PR -, HER2/neu - ; s/p lumpectomy 01/17/2018 + 4 cycles TC systemic chemotherapy + XRT   Long term current use of aspirin     Personal history of chemotherapy    Personal history of radiation therapy    Pneumothorax of left lung after biopsy 01/25/2023   a.) hypoxic in PACU requiring re-intubation --> transferred to ICU --> chest tube placed by PCCM   RLS (restless legs syndrome)    a.) on ropinirole    Skin cancer    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   a.) Stage IB (cT1a, cN0, cM0, G3, ER+, PR-, HER2-)     PAST SURGICAL HISTORY (PSH):  Past Surgical History:  Procedure Laterality Date   BIOPSY  01/05/2023   Procedure: BIOPSY;  Surgeon: Unk Corinn Skiff, MD;  Location: [facility];  Service: Gastroenterology;;   BREAST BIOPSY Right 1997   positive   BREAST BIOPSY Right 11/15/2017   INVASIVE MAMMARY CARCINOMA triple negative   BREAST BIOPSY Right 07/20/2020   us  bx, Q marker, neg   BREAST LUMPECTOMY Right 1997   2019 also   BREAST LUMPECTOMY WITH SENTINEL LYMPH NODE BIOPSY Right 12/08/2017   Procedure:  BREAST LUMPECTOMY WITH SENTINEL LYMPH NODE BX;  Surgeon: Dessa Reyes ORN, MD;  Location: [facility];  Service: General;  Laterality: Right;   BREAST REDUCTION WITH MASTOPEXY Left 06/10/2019   Procedure: Left breast mastopexy reduction for symmetry;  Surgeon: Lowery Estefana RAMAN, DO;  Location: [facility];  Service: Plastics;  Laterality: Left;  total 2.5 hours   CATARACT EXTRACTION W/PHACO Right 02/07/2022   Procedure: CATARACT EXTRACTION PHACO AND INTRAOCULAR LENS PLACEMENT (IOC) RIGHT DIABETIC;  Surgeon: Myrna Adine Anes, MD;  Location: [facility];  Service: Ophthalmology;  Laterality: Right;  3.60 00:34.7   CATARACT EXTRACTION W/PHACO Left 02/28/2022   Procedure: CATARACT EXTRACTION PHACO AND INTRAOCULAR LENS PLACEMENT (IOC) LEFT DIABETIC 4.11 00:26.1;  Surgeon: Myrna Adine Anes, MD;  Location: [facility];  Service: Ophthalmology;  Laterality: Left;   COLONOSCOPY  2015   COLONOSCOPY N/A 01/05/2023   Procedure: COLONOSCOPY;  Surgeon: Unk Corinn Skiff, MD;  Location: [facility];  Service: Gastroenterology;  Laterality: N/A;   COLONOSCOPY WITH PROPOFOL  N/A 10/29/2019   Procedure: COLONOSCOPY WITH PROPOFOL ;  Surgeon: Jinny Carmine, MD;  Location: [facility];  Service: Endoscopy;  Laterality: N/A;   ESOPHAGOGASTRODUODENOSCOPY (EGD) WITH PROPOFOL  N/A 05/24/2019   Procedure: ESOPHAGOGASTRODUODENOSCOPY (EGD) WITH PROPOFOL ;  Surgeon: Jinny Carmine, MD;  Location: [facility];  Service: Endoscopy;  Laterality: N/A;   ESOPHAGOGASTRODUODENOSCOPY (EGD) WITH PROPOFOL  N/A 09/06/2022   Procedure: ESOPHAGOGASTRODUODENOSCOPY (EGD) WITH PROPOFOL ;  Surgeon: Jinny Carmine, MD;  Location: [facility];  Service: Endoscopy;  Laterality: N/A;   ESOPHAGOGASTRODUODENOSCOPY (EGD) WITH PROPOFOL   01/05/2023   Procedure: ESOPHAGOGASTRODUODENOSCOPY (EGD) WITH PROPOFOL ;  Surgeon: Unk Corinn Skiff, MD;  Location: [facility];  Service: Gastroenterology;;   ESOPHAGUS SURGERY     GASTROSTOMY N/A 05/25/2023   Procedure: OPEN INSERTION OF GASTROSTOMY TUBE;  Surgeon: Jordis Laneta FALCON, MD;  Location: [facility];  Service: General;  Laterality: N/A;   IR IMAGING GUIDED PORT INSERTION  02/07/2023   IR REPLACE G-TUBE SIMPLE WO FLUORO  02/27/2024   IR REPLC GASTRO/COLONIC TUBE PERCUT W/FLUORO  09/12/2023   KNEE SURGERY Right    LIPOSUCTION WITH LIPOFILLING Right 06/10/2019   Procedure: right breast scar and capsule release with fat grafting;  Surgeon: Lowery Estefana RAMAN, DO;  Location: [facility];  Service: Plastics;  Laterality:  Right;   POLYPECTOMY  01/05/2023   Procedure: POLYPECTOMY;  Surgeon: Unk Corinn Skiff, MD;  Location: [facility];  Service: Gastroenterology;;   [facility] REMOVAL     PORTACATH PLACEMENT Right 01/17/2018   Procedure: INSERTION PORT-A-CATH- RIGHT;  Surgeon: Dessa Reyes ORN, MD;  Location: [facility];  Service: General;  Laterality: Right;   PULMONARY THROMBECTOMY Bilateral 05/12/2023   Procedure: PULMONARY THROMBECTOMY;  Surgeon: Marea Selinda RAMAN, MD;  Location: [facility];  Service: Cardiovascular;  Laterality: Bilateral;   ROBOTIC ADRENALECTOMY Right 12/28/2016   Procedure: ROBOTIC ADRENALECTOMY;  Surgeon: Penne Knee, MD;  Location: [facility];  Service: Urology;  Laterality: Right;   SKIN CANCER EXCISION     THROAT SURGERY  1998   throat cancer    TUBAL LIGATION     VENTRAL HERNIA REPAIR N/A 03/03/2017   Procedure: HERNIA REPAIR VENTRAL ADULT;  Surgeon: Shelva Dunnings, MD;  Location: [facility];  Service: General;  Laterality: N/A;   VIDEO BRONCHOSCOPY WITH ENDOBRONCHIAL ULTRASOUND N/A 01/25/2023   Procedure: VIDEO BRONCHOSCOPY WITH ENDOBRONCHIAL ULTRASOUND;  Surgeon: Parris Manna, MD;  Location: [facility];  Service: Thoracic;  Laterality: N/A;   VIDEO BRONCHOSCOPY WITH ENDOBRONCHIAL ULTRASOUND N/A  02/17/2023   Procedure: VIDEO BRONCHOSCOPY WITH ENDOBRONCHIAL ULTRASOUND;  Surgeon: Parris Manna, MD;  Location: [facility];  Service: Thoracic;  Laterality: N/A;     MEDICATIONS:  Prior to Admission medications  Medication Sig Start Date End Date Taking? Authorizing Provider  albuterol  (VENTOLIN  HFA) 108 (90 Base) MCG/ACT inhaler Inhale 2 puffs into the lungs every 6 (six) hours as needed for wheezing or shortness of breath. 07/21/23   Pardue, Lauraine SAILOR, DO  apixaban  (ELIQUIS ) 5 MG TABS tablet TAKE 1 TABLET BY MOUTH TWICE A DAY 04/17/24   Kasa, Kurian, MD  benzonatate  (TESSALON ) 100 MG capsule Take 2 capsules (200 mg total) by mouth 3 (three) times daily as needed for cough.  06/05/23   Laurence Locus, DO  bisacodyl  (DULCOLAX) 10 MG suppository Place 1 suppository (10 mg total) rectally daily as needed for moderate constipation or severe constipation. 06/05/23   Laurence Locus, DO  buPROPion  (WELLBUTRIN ) 75 MG tablet Take 75 mg by mouth 2 (two) times daily.    [provider]  busPIRone  (BUSPAR ) 5 MG tablet Take 1 tablet (5 mg total) by mouth 3 (three) times daily as needed. 11/27/23   [name], DO  cefdinir  (OMNICEF ) 300 MG capsule Take 1 capsule (300 mg total) by mouth 2 (two) times daily. 07/04/23   [name], DO  cyclobenzaprine  (FLEXERIL ) 5 MG tablet Take 5 mg by mouth 3 (three) times daily as needed for muscle spasms.    [provider]  DULoxetine  (CYMBALTA ) 60 MG capsule TAKE 1 CAPSULE BY MOUTH DAILY 03/22/24   Pardue, Lauraine SAILOR, DO  fentaNYL  (DURAGESIC ) 12 MCG/HR Place 1 patch onto the skin every 3 (three) days. 04/16/24   Melanee Annah BROCKS, MD  fluticasone  (FLONASE ) 50 MCG/ACT nasal spray Place 2 sprays into both nostrils daily.    [provider]  Fluticasone -Umeclidin-Vilant (TRELEGY ELLIPTA ) 100-62.5-25 MCG/ACT AEPB Inhale 1 Act into the lungs daily. 08/30/23   Kasa, Kurian, MD  Fluticasone -Umeclidin-Vilant (TRELEGY ELLIPTA ) 100-62.5-25 MCG/ACT AEPB Inhale 1 puff into the lungs daily. 12/22/23   Kasa, Kurian, MD  GILOTRIF  30 MG tablet TAKE 1 TABLET DAILY ON AN EMPTY STOMACH AT LEAST 1 HOUR BEFORE OR 2 HOURS AFTER MEALS 03/22/24   Melanee Annah BROCKS, MD  ipratropium-albuterol  (DUONEB) 0.5-2.5 (3) MG/3ML SOLN Take 3 mLs by nebulization every 4 (four) hours as needed.    [provider]  lactulose , encephalopathy, (CHRONULAC ) 10 GM/15ML SOLN Place 30 mLs (20 g total) into feeding tube 2 (two) times daily. 11/27/23   [name], DO  lidocaine  (LIDODERM ) 5 % Place 1 patch onto the skin daily. Remove & Discard patch within 12 hours or as directed by MD 08/25/23   Melanee Annah BROCKS, MD  meloxicam  (MOBIC ) 7.5 MG tablet Take 1 tablet (7.5 mg  total) by mouth daily. 11/27/23   [name], DO  metoprolol  succinate (TOPROL -XL) 25 MG 24 hr tablet TAKE 1 TABLET BY MOUTH DAILY 01/04/24   [name], DO  montelukast  (SINGULAIR ) 10 MG tablet TAKE 1 TABLET BY MOUTH AT BEDTIME 01/22/24   [name], DO  Nutritional Supplements (NUTREN 1.5) LIQD Give 1 carton 4 times a day (8am, noon, 4pm and 8pm). Flush with 60ml of water  before and after each feeding.  Give additional 1 1/2 cups of water  a day via tube or drink orally for adequate hydration. 06/28/23   Melanee Annah BROCKS, MD  omeprazole  (KONVOMEP ) 2 mg/mL SUSP oral suspension Take 20 mLs (40 mg total)  by mouth daily. 04/12/23   Borders, Fonda SAUNDERS, NP  ondansetron  (ZOFRAN ) 4 MG tablet Take 1 tablet (4 mg total) by mouth every 6 (six) hours as needed for nausea or vomiting. 06/05/23 06/04/24  Laurence Locus, DO  ondansetron  (ZOFRAN -ODT) 4 MG disintegrating tablet Take 1 tablet (4 mg total) by mouth every 8 (eight) hours as needed for nausea or vomiting. 04/01/24   Viviann Pastor, MD  oxyCODONE  (ROXICODONE ) 5 MG/5ML solution Take 5 mLs (5 mg total) by mouth every 8 (eight) hours as needed for severe pain (pain score 7-10). 04/12/24   Lanell Jacobsen, NP  oxyCODONE  (ROXICODONE ) 5 MG/5ML solution Take 5 mLs (5 mg total) by mouth every 8 (eight) hours as needed for severe pain (pain score 7-10). 04/16/24   Melanee Annah BROCKS, MD  predniSONE  (DELTASONE ) 20 MG tablet Take 1 tablet (20 mg total) by mouth daily with breakfast. 7 days 08/30/23   Isaiah Scrivener, MD  predniSONE  (DELTASONE ) 20 MG tablet Take 1 tablet (20 mg total) by mouth daily with breakfast for 7 days. 04/11/24 04/18/24  Simmons-Robinson, Rockie, MD  pregabalin (LYRICA) 25 MG capsule Take 1 capsule (25 mg total) by mouth 2 (two) times daily. 04/11/24   Simmons-Robinson, Rockie, MD  rOPINIRole  (REQUIP ) 0.5 MG tablet Take 1 tablet (0.5 mg total) by mouth 3 (three) times daily. 11/27/23   [name], DO  rosuvastatin  (CRESTOR ) 40 MG tablet PLACE 1  TABLET (40 MG TOTAL) INTO FEEDING TUBE DAILY 02/26/24   Gasper Nancyann BRAVO, MD  senna (SENOKOT) 8.6 MG TABS tablet Place 1 tablet (8.6 mg total) into feeding tube daily. 06/06/23   Laurence Locus, DO  sucralfate  (CARAFATE ) 1 GM/10ML suspension Place 10 mLs (1 g total) into feeding tube 4 (four) times daily. 10/12/23   [name], DO  tolterodine  (DETROL  LA) 4 MG 24 hr capsule Take 4 mg by mouth daily. 08/24/23   [provider]  triamcinolone  cream (KENALOG ) 0.1 % Apply 1 Application topically 2 (two) times daily. 04/24/23   [provider]  Water  For Irrigation, Sterile (FREE WATER ) SOLN Place 60 mLs into feeding tube 5 (five) times daily. 06/05/23   Laurence Locus, DO     ALLERGIES:  Allergies[1]   SOCIAL HISTORY:  Social History   Socioeconomic History   Marital status: Married    Spouse name: Ayah, Cozzolino (Spouse) [phone] (Mobile)   Number of children: 4   Years of education: Not on file   Highest education level: 10th grade  Occupational History   Occupation: retired  Tobacco Use   Smoking status: Former    Current packs/day: 0.00    Average packs/day: 1.5 packs/day for 30.0 years (45.0 ttl pk-yrs)    Types: Cigarettes    Start date: 05/02/1965    Quit date: 05/03/1995    Years since quitting: 28.9    Passive exposure: Past   Smokeless tobacco: Never  Vaping Use   Vaping status: Never Used  Substance and Sexual Activity   Alcohol use: Not Currently    Alcohol/week: 4.0 standard drinks of alcohol    Types: 4 Shots of liquor per week    Comment: none   Drug use: Never   Sexual activity: Not Currently    Birth control/protection: Post-menopausal  Other Topics Concern   Not on file  Social History Narrative   Patient from home with husband.   Social Drivers of Health   Tobacco Use: Medium Risk (04/18/2024)   Patient History    Smoking Tobacco Use:  Former    Smokeless Tobacco Use: Never    Passive Exposure: Past  Programmer, Applications: Low Risk  (12/06/2023)   Overall Financial Resource Strain (CARDIA)    Difficulty of Paying Living Expenses: Not hard at all  Food Insecurity: No Food Insecurity (12/06/2023)   Epic    Worried About Programme Researcher, Broadcasting/film/video in the Last Year: Never true    Ran Out of Food in the Last Year: Never true  Transportation Needs: No Transportation Needs (12/06/2023)   Epic    Lack of Transportation (Medical): No    Lack of Transportation (Non-Medical): No  Physical Activity: Insufficiently Active (12/06/2023)   Exercise Vital Sign    Days of Exercise per Week: 3 days    Minutes of Exercise per Session: 30 min  Stress: No Stress Concern Present (12/06/2023)   [employer] of Occupational Health - Occupational Stress Questionnaire    Feeling of Stress: Only a little  Social Connections: Moderately Isolated (12/06/2023)   Social Connection and Isolation Panel    Frequency of Communication with Friends and Family: More than three times a week    Frequency of Social Gatherings with Friends and Family: Never    Attends Religious Services: Never    Database Administrator or Organizations: No    Attends Banker Meetings: Never    Marital Status: Married  Catering Manager Violence: Not At Risk (12/06/2023)   Epic    Fear of Current or Ex-Partner: No    Emotionally Abused: No    Physically Abused: No    Sexually Abused: No  Depression (PHQ2-9): Medium Risk (04/16/2024)   Depression (PHQ2-9)    PHQ-2 Score: 6  Alcohol Screen: Low Risk (12/06/2023)   Alcohol Screen    Last Alcohol Screening Score (AUDIT): 0  Housing: Unknown (12/06/2023)   Epic    Unable to Pay for Housing in the Last Year: No    Number of Times Moved in the Last Year: Not on file    Homeless in the Last Year: No  Utilities: Not At Risk (12/06/2023)   Epic    Threatened with loss of utilities: No  Health Literacy: Adequate Health Literacy (12/06/2023)   B1300 Health Literacy    Frequency of need for help with medical instructions: Never      FAMILY HISTORY:  Family History  Problem Relation Age of Onset   Diabetes Mother    Heart attack Mother    Cancer Sister        cancer of eyes, spread to lung, other places. died at 53   Cancer Sister 46       unknown primary, spread to bone, brain died in 53's   Cancer Paternal Aunt        unk type   Cancer Paternal Grandmother        type unk   Prostate cancer Neg Hx    Kidney cancer Neg Hx    Bladder Cancer Neg Hx    Breast cancer Neg Hx       REVIEW OF SYSTEMS:  Review of Systems  Constitutional:  Negative for chills and fever.  Respiratory:  Negative for cough and shortness of breath.   Cardiovascular:  Negative for chest pain and palpitations.  Gastrointestinal:  Positive for abdominal pain. Negative for nausea and vomiting.  All other systems reviewed and are negative.   VITAL SIGNS:  Temp:  [97.9 F (36.6 C)] 97.9 F (36.6 C) (12/18 1518) Pulse Rate:  [105-110]  105 (12/18 1518) Resp:  [18] 18 (12/18 1518) BP: (89-99)/(72-75) 89/75 (12/18 1518) SpO2:  [99 %-100 %] 99 % (12/18 1518) Weight:  [49 kg] 49 kg (12/18 1103)     Height: 5' 1 (154.9 cm) Weight: 49 kg BMI (Calculated): 20.42   INTAKE/OUTPUT:  No intake/output data recorded.  PHYSICAL EXAM:  Physical Exam Vitals and nursing note reviewed. Exam conducted with a chaperone present.  Constitutional:      General: She is not in acute distress.    Appearance: Normal appearance.     Comments: Patient resting in bed; NAD  HENT:     Head: Normocephalic and atraumatic.  Eyes:     General: No scleral icterus.    Conjunctiva/sclera: Conjunctivae normal.  Pulmonary:     Effort: Pulmonary effort is normal. No respiratory distress.  Abdominal:     General: Abdomen is flat.     Palpations: Abdomen is soft.     Tenderness: There is abdominal tenderness. There is no guarding or rebound.     Comments: Abdomen is soft. To the left upper abdomen, gastrostomy tube is present. There is significantly drainage  around this and intermittent ooze around the tube as well. Surrounding skin is significantly macerated.   Genitourinary:    Comments: Deferred Neurological:     General: No focal deficit present.     Mental Status: She is alert and oriented to person, place, and time.  Psychiatric:        Mood and Affect: Mood normal.        Behavior: Behavior normal.    Gastrostomy Tube (04/18/2024):    Labs:     Latest Ref Rng & Units 04/18/2024    1:21 PM 04/16/2024    8:50 AM 04/01/2024    2:08 PM  CBC  WBC 4.0 - 10.5 K/uL 9.2  9.5  8.6   Hemoglobin 12.0 - 15.0 g/dL 86.9  87.6  86.0   Hematocrit 36.0 - 46.0 % 41.5  38.2  43.0   Platelets 150 - 400 K/uL 180  198  174       Latest Ref Rng & Units 04/18/2024    1:21 PM 04/16/2024    8:50 AM 04/01/2024    2:08 PM  CMP  Glucose 70 - 99 mg/dL 852  893  870   BUN 8 - 23 mg/dL 22  20  14    Creatinine 0.44 - 1.00 mg/dL 9.25  9.25  9.29   Sodium 135 - 145 mmol/L 140  140  139   Potassium 3.5 - 5.1 mmol/L 4.4  4.2  4.1   Chloride 98 - 111 mmol/L 102  104  103   CO2 22 - 32 mmol/L 31  27  25    Calcium  8.9 - 10.3 mg/dL 8.7  9.2  9.0   Total Protein 6.5 - 8.1 g/dL  5.9    Total Bilirubin 0.0 - 1.2 mg/dL  0.4    Alkaline Phos 38 - 126 U/L  84    AST 15 - 41 U/L  45    ALT 0 - 44 U/L  42      Imaging studies:   CT Abdomen/Pelvis (04/18/2024) personally reviewed with gastrostomy tube in stomach, and radiologist report reviewed below:  IMPRESSION: 1. No acute findings in the abdomen/pelvis. 2. Evidence of patient's percutaneous gastrostomy tube as the balloon portion of the tube is not well-defined and is likely misshapen containing small amount of air. Tube is in otherwise adequate position. No concerning  fluid collection along the tube tract. 3. Mild colonic diverticulosis without active inflammation. 4. Couple small left renal cysts unchanged. 5. Interval improvement of previously seen multiple small bilateral nodules over the lung  bases in this patient with known metastatic lung cancer. 6. Aortic atherosclerosis.   Assessment/Plan: 68 y.o. female with gastrostomy tube issues, complicated by significant, advanced, malignancy    - Attempted to have lengthy discussion with the patient and her husband at bedside this afternoon. There has been multiple issues with her gastrotomy tubes since placement including efforts at exchange without improvement. At this point, there is not much to offer from a surgical perspective especially given her advanced underlying disease. I do think she warrants potential admission for pain/symptom control and ensure we have adequate hemostasis regarding the bleeding/drainage around the gastrostomy; appreciate hospitalist willingness to do so. I was able to place surgicel and barrier powder to the skin and place pressure dressing. We will continue to reassess. I do think we should again involve palliative care to discuss goals of care and how aggressive she wishes to be moving forward. The only potential option at this time from surgical perspective would be to remove this. She is quite frail with advanced disease.   All of the above findings and recommendations were discussed with the patient and her family (husband at bedside), and all of their questions were answered to their expressed satisfaction.  Thank you for the opportunity to participate in this patient's care.   -- Arthea Platt, PA-C [facility] 04/18/2024, 4:53 PM M-F: 7am - 4pm     [1]  Allergies Allergen Reactions   Sulfa  Antibiotics Other (See Comments)    Mouth sore and raw

## 2024-04-18 NOTE — Progress Notes (Signed)
 Outpatient Surgical Follow Up    Denise Watts is an 68 y.o. female.   No chief complaint on file.   HPI:  Metastatic lung CA, malnutrition , had multiple malignancies in the past now presenting with stage IV non-small cell lung cancer with bone and lymph node metastases . Recent exchange of G tube by IR,  Now continues to have issues when she eats as it leaks around it     Past Medical History:  Diagnosis Date   Acute pulmonary embolism (HCC) 05/10/2023   Adenomatous colon polyp    Adrenal carcinoma, right (HCC) 12/28/2016   a.) high grade carcinoma with extensive necrosis --> s/p adrenalectomy   Anxiety    Aortic atherosclerosis    Arthritis of right knee    Atrial fibrillation and flutter (HCC)    a.) CHA2DS2-VASc = 3 (age, sex, vascular disease history) as of 02/13/2023; b.) cardiac rate/rhythm maintained intrinsically without pharmacological intervention; no chronic OAC (does take low dose ASA)   B12 deficiency    Breast cancer (HCC) 1997   a.) stage IB (cT1a, cN0, cM0, G3, ER+, PR-, HER2-); s/p lumpectomy + XRT   COPD (chronic obstructive pulmonary disease) (HCC)    Coronary artery disease    a.) cCTA 12/09/2021: Ca2+ = 135 (80th %'ile for age/sex/race match contol) --> 25-49% pLAD and RCA distributions   Depression    Diastolic dysfunction    a.) TTE 12/11/2020, no RWMAs, norm RVSF, G1DD; b.) TTE 12/14/2021: EF 55-60, no RWMAs, norm RVSF, G1DD; c.) TTE 01/27/2023: EF 50-55%, no RWMAs, G1DD, mild RAE, mild MR, mod TR   Dry cough 03/02/2017   Dyspnea    Full dentures    GERD (gastroesophageal reflux disease)    History of hiatal hernia    HLD (hyperlipidemia)    Invasive carcinoma of breast (HCC) 11/15/2017   a.) path (+) for G3, ER -, PR -, HER2/neu - ; s/p lumpectomy 01/17/2018 + 4 cycles TC systemic chemotherapy + XRT   Long term current use of aspirin     Personal history of chemotherapy    Personal history of radiation therapy    Pneumothorax of left lung after  biopsy 01/25/2023   a.) hypoxic in PACU requiring re-intubation --> transferred to ICU --> chest tube placed by PCCM   RLS (restless legs syndrome)    a.) on ropinirole    Skin cancer    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   a.) Stage IB (cT1a, cN0, cM0, G3, ER+, PR-, HER2-)    Past Surgical History:  Procedure Laterality Date   BIOPSY  01/05/2023   Procedure: BIOPSY;  Surgeon: Unk Corinn Skiff, MD;  Location: [facility];  Service: Gastroenterology;;   BREAST BIOPSY Right 1997   positive   BREAST BIOPSY Right 11/15/2017   INVASIVE MAMMARY CARCINOMA triple negative   BREAST BIOPSY Right 07/20/2020   us  bx, Q marker, neg   BREAST LUMPECTOMY Right 1997   2019 also   BREAST LUMPECTOMY WITH SENTINEL LYMPH NODE BIOPSY Right 12/08/2017   Procedure: BREAST LUMPECTOMY WITH SENTINEL LYMPH NODE BX;  Surgeon: Dessa Reyes ORN, MD;  Location: [facility];  Service: General;  Laterality: Right;   BREAST REDUCTION WITH MASTOPEXY Left 06/10/2019   Procedure: Left breast mastopexy reduction for symmetry;  Surgeon: Lowery Estefana RAMAN, DO;  Location: [facility];  Service: Plastics;  Laterality: Left;  total 2.5 hours   CATARACT EXTRACTION W/PHACO Right 02/07/2022   Procedure: CATARACT EXTRACTION PHACO AND INTRAOCULAR  LENS PLACEMENT (IOC) RIGHT DIABETIC;  Surgeon: Myrna Adine Anes, MD;  Location: [facility];  Service: Ophthalmology;  Laterality: Right;  3.60 00:34.7   CATARACT EXTRACTION W/PHACO Left 02/28/2022   Procedure: CATARACT EXTRACTION PHACO AND INTRAOCULAR LENS PLACEMENT (IOC) LEFT DIABETIC 4.11 00:26.1;  Surgeon: Myrna Adine Anes, MD;  Location: [facility];  Service: Ophthalmology;  Laterality: Left;   COLONOSCOPY  2015   COLONOSCOPY N/A 01/05/2023   Procedure: COLONOSCOPY;  Surgeon: Unk Corinn Skiff, MD;  Location: [facility];  Service: Gastroenterology;  Laterality: N/A;   COLONOSCOPY WITH PROPOFOL  N/A 10/29/2019   Procedure:  COLONOSCOPY WITH PROPOFOL ;  Surgeon: Jinny Carmine, MD;  Location: [facility];  Service: Endoscopy;  Laterality: N/A;   ESOPHAGOGASTRODUODENOSCOPY (EGD) WITH PROPOFOL  N/A 05/24/2019   Procedure: ESOPHAGOGASTRODUODENOSCOPY (EGD) WITH PROPOFOL ;  Surgeon: Jinny Carmine, MD;  Location: [facility];  Service: Endoscopy;  Laterality: N/A;   ESOPHAGOGASTRODUODENOSCOPY (EGD) WITH PROPOFOL  N/A 09/06/2022   Procedure: ESOPHAGOGASTRODUODENOSCOPY (EGD) WITH PROPOFOL ;  Surgeon: Jinny Carmine, MD;  Location: [facility];  Service: Endoscopy;  Laterality: N/A;   ESOPHAGOGASTRODUODENOSCOPY (EGD) WITH PROPOFOL   01/05/2023   Procedure: ESOPHAGOGASTRODUODENOSCOPY (EGD) WITH PROPOFOL ;  Surgeon: Unk Corinn Skiff, MD;  Location: [facility];  Service: Gastroenterology;;   ESOPHAGUS SURGERY     GASTROSTOMY N/A 05/25/2023   Procedure: OPEN INSERTION OF GASTROSTOMY TUBE;  Surgeon: Jordis Laneta FALCON, MD;  Location: [facility];  Service: General;  Laterality: N/A;   IR IMAGING GUIDED PORT INSERTION  02/07/2023   IR REPLACE G-TUBE SIMPLE WO FLUORO  02/27/2024   IR REPLC GASTRO/COLONIC TUBE PERCUT W/FLUORO  09/12/2023   KNEE SURGERY Right    LIPOSUCTION WITH LIPOFILLING Right 06/10/2019   Procedure: right breast scar and capsule release with fat grafting;  Surgeon: Lowery Estefana RAMAN, DO;  Location: [facility];  Service: Plastics;  Laterality: Right;   POLYPECTOMY  01/05/2023   Procedure: POLYPECTOMY;  Surgeon: Unk Corinn Skiff, MD;  Location: [facility];  Service: Gastroenterology;;   [facility] REMOVAL     PORTACATH PLACEMENT Right 01/17/2018   Procedure: INSERTION PORT-A-CATH- RIGHT;  Surgeon: Dessa Reyes ORN, MD;  Location: [facility];  Service: General;  Laterality: Right;   PULMONARY THROMBECTOMY Bilateral 05/12/2023   Procedure: PULMONARY THROMBECTOMY;  Surgeon: Marea Selinda RAMAN, MD;  Location: [facility];  Service: Cardiovascular;  Laterality: Bilateral;   ROBOTIC ADRENALECTOMY Right  12/28/2016   Procedure: ROBOTIC ADRENALECTOMY;  Surgeon: Penne Knee, MD;  Location: [facility];  Service: Urology;  Laterality: Right;   SKIN CANCER EXCISION     THROAT SURGERY  1998   throat cancer    TUBAL LIGATION     VENTRAL HERNIA REPAIR N/A 03/03/2017   Procedure: HERNIA REPAIR VENTRAL ADULT;  Surgeon: Shelva Dunnings, MD;  Location: [facility];  Service: General;  Laterality: N/A;   VIDEO BRONCHOSCOPY WITH ENDOBRONCHIAL ULTRASOUND N/A 01/25/2023   Procedure: VIDEO BRONCHOSCOPY WITH ENDOBRONCHIAL ULTRASOUND;  Surgeon: Parris Manna, MD;  Location: [facility];  Service: Thoracic;  Laterality: N/A;   VIDEO BRONCHOSCOPY WITH ENDOBRONCHIAL ULTRASOUND N/A 02/17/2023   Procedure: VIDEO BRONCHOSCOPY WITH ENDOBRONCHIAL ULTRASOUND;  Surgeon: Parris Manna, MD;  Location: [facility];  Service: Thoracic;  Laterality: N/A;    Family History  Problem Relation Age of Onset   Diabetes Mother    Heart attack Mother    Cancer Sister        cancer of eyes, spread to lung, other places. died at 43   Cancer Sister 53  unknown primary, spread to bone, brain died in 39's   Cancer Paternal Aunt        unk type   Cancer Paternal Grandmother        type unk   Prostate cancer Neg Hx    Kidney cancer Neg Hx    Bladder Cancer Neg Hx    Breast cancer Neg Hx     Social History:  reports that she quit smoking about 28 years ago. Her smoking use included cigarettes. She started smoking about 59 years ago. She has a 45 pack-year smoking history. She has been exposed to tobacco smoke. She has never used smokeless tobacco. She reports that she does not currently use alcohol after a past usage of about 4.0 standard drinks of alcohol per week. She reports that she does not use drugs.  Allergies: Allergies[1]  Medications reviewed.    ROS Full ROS performed and is otherwise negative other than what is stated in HPI   BP 98/64   Pulse 76   Temp (!) 97.5 F (36.4 C) (Oral)   Ht 5' 1 (1.549 m)    Wt 109 lb (49.4 kg)   SpO2 96%   BMI 20.60 kg/m   Physical Exam Chronically ill, severely malnourished Abd: soft, nt, g tube in place with circumferential excoriation, no abscess, patent. I placed extra 2 cc fluid balloon w/o resistance to try to seal natural fistula from peg to skin    Assessment/Plan: Metastatic cancer with chronic and metastatic cancer. She does have irritation on skin but no abscess. Not many options as her body is to frail and weak to heal anything We can potentially ask IR to exchange again but I suspect options are limited as her cancer continues to evolve.  Very unfortunate situation. RTC 4 weeks or so I personally spent a total of 20 minutes in the care of the patient today including performing a medically appropriate exam/evaluation, counseling and educating, placing orders, referring and communicating with other health care professionals, documenting clinical information in the EHR, independently interpreting and reviewing images studies and coordinating care.   Laneta Luna, MD FACS General Surgeon    [1]  Allergies Allergen Reactions   Sulfa  Antibiotics Other (See Comments)    Mouth sore and raw

## 2024-04-18 NOTE — Telephone Encounter (Signed)
 Unable to LVM. Called pt to schedule a 1 year follow up  Pt's pharmacy called requesting more refills. Pt has not been seen in over one year.

## 2024-04-18 NOTE — H&P (Signed)
 History and Physical    Patient: Denise Watts FMW:[id number] DOB: 1955-05-20 DOA: 04/18/2024 DOS: the patient was seen and examined on 04/18/2024 PCP: Denise Lauraine SAILOR, DO  Patient coming from: Home  Chief Complaint: Bleeding from feeding tube site Chief Complaint  Patient presents with   feeding tube problem   HPI: Denise Watts is a 68 y.o. female with medical history significant of history of PE, adrenal carcinoma, history of B12 deficiency, history of breast cancer, A-fib, depression, COPD, coronary artery disease, GERD, hyperlipidemia, diastolic congestive heart failure, restless leg syndrome.  Patient comes into the emergency room today on account of bleeding around the gastrostomy tube site.  According to the patient she has been experiencing some bleeding intermittently around the tube site however this has not been that profuse compared to the bleeding seen today.  The bleeding stopped on the dressing around the tube site and subsequently patient came in for further management.  She is still on Eliquis  at home.  She denied nausea vomiting abdominal pain chest pain or cough  ED course: Upon arrival to the emergency room temperature 97.9, respiratory rate 18, heart rate 110, blood pressure 99/72 saturating 100% on room air. Given bleeding around the tube site General Surgery was contacted by ED physician who recommended admitting for close observation and symptom management and possible tube exchange.  Review of Systems: As mentioned in the history of present illness. All other systems reviewed and are negative. Past Medical History:  Diagnosis Date   Acute pulmonary embolism (HCC) 05/10/2023   Adenomatous colon polyp    Adrenal carcinoma, right (HCC) 12/28/2016   a.) high grade carcinoma with extensive necrosis --> s/p adrenalectomy   Anxiety    Aortic atherosclerosis    Arthritis of right knee    Atrial fibrillation and flutter (HCC)    a.) CHA2DS2-VASc = 3 (age, sex, vascular  disease history) as of 02/13/2023; b.) cardiac rate/rhythm maintained intrinsically without pharmacological intervention; no chronic OAC (does take low dose ASA)   B12 deficiency    Breast cancer (HCC) 1997   a.) stage IB (cT1a, cN0, cM0, G3, ER+, PR-, HER2-); s/p lumpectomy + XRT   COPD (chronic obstructive pulmonary disease) (HCC)    Coronary artery disease    a.) cCTA 12/09/2021: Ca2+ = 135 (80th %'ile for age/sex/race match contol) --> 25-49% pLAD and RCA distributions   Depression    Diastolic dysfunction    a.) TTE 12/11/2020, no RWMAs, norm RVSF, G1DD; b.) TTE 12/14/2021: EF 55-60, no RWMAs, norm RVSF, G1DD; c.) TTE 01/27/2023: EF 50-55%, no RWMAs, G1DD, mild RAE, mild MR, mod TR   Dry cough 03/02/2017   Dyspnea    Full dentures    GERD (gastroesophageal reflux disease)    History of hiatal hernia    HLD (hyperlipidemia)    Invasive carcinoma of breast (HCC) 11/15/2017   a.) path (+) for G3, ER -, PR -, HER2/neu - ; s/p lumpectomy 01/17/2018 + 4 cycles TC systemic chemotherapy + XRT   Long term current use of aspirin     Personal history of chemotherapy    Personal history of radiation therapy    Pneumothorax of left lung after biopsy 01/25/2023   a.) hypoxic in PACU requiring re-intubation --> transferred to ICU --> chest tube placed by PCCM   RLS (restless legs syndrome)    a.) on ropinirole    Skin cancer    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   a.) Stage IB (cT1a, cN0,  cM0, G3, ER+, PR-, HER2-)   Past Surgical History:  Procedure Laterality Date   BIOPSY  01/05/2023   Procedure: BIOPSY;  Surgeon: Unk Corinn Skiff, MD;  Location: [facility];  Service: Gastroenterology;;   BREAST BIOPSY Right 1997   positive   BREAST BIOPSY Right 11/15/2017   INVASIVE MAMMARY CARCINOMA triple negative   BREAST BIOPSY Right 07/20/2020   us  bx, Q marker, neg   BREAST LUMPECTOMY Right 1997   2019 also   BREAST LUMPECTOMY WITH SENTINEL LYMPH NODE BIOPSY Right 12/08/2017    Procedure: BREAST LUMPECTOMY WITH SENTINEL LYMPH NODE BX;  Surgeon: Dessa Reyes ORN, MD;  Location: [facility];  Service: General;  Laterality: Right;   BREAST REDUCTION WITH MASTOPEXY Left 06/10/2019   Procedure: Left breast mastopexy reduction for symmetry;  Surgeon: Lowery Estefana RAMAN, DO;  Location: [facility];  Service: Plastics;  Laterality: Left;  total 2.5 hours   CATARACT EXTRACTION W/PHACO Right 02/07/2022   Procedure: CATARACT EXTRACTION PHACO AND INTRAOCULAR LENS PLACEMENT (IOC) RIGHT DIABETIC;  Surgeon: Myrna Adine Anes, MD;  Location: [facility];  Service: Ophthalmology;  Laterality: Right;  3.60 00:34.7   CATARACT EXTRACTION W/PHACO Left 02/28/2022   Procedure: CATARACT EXTRACTION PHACO AND INTRAOCULAR LENS PLACEMENT (IOC) LEFT DIABETIC 4.11 00:26.1;  Surgeon: Myrna Adine Anes, MD;  Location: [facility];  Service: Ophthalmology;  Laterality: Left;   COLONOSCOPY  2015   COLONOSCOPY N/A 01/05/2023   Procedure: COLONOSCOPY;  Surgeon: Unk Corinn Skiff, MD;  Location: [facility];  Service: Gastroenterology;  Laterality: N/A;   COLONOSCOPY WITH PROPOFOL  N/A 10/29/2019   Procedure: COLONOSCOPY WITH PROPOFOL ;  Surgeon: Jinny Carmine, MD;  Location: [facility];  Service: Endoscopy;  Laterality: N/A;   ESOPHAGOGASTRODUODENOSCOPY (EGD) WITH PROPOFOL  N/A 05/24/2019   Procedure: ESOPHAGOGASTRODUODENOSCOPY (EGD) WITH PROPOFOL ;  Surgeon: Jinny Carmine, MD;  Location: [facility];  Service: Endoscopy;  Laterality: N/A;   ESOPHAGOGASTRODUODENOSCOPY (EGD) WITH PROPOFOL  N/A 09/06/2022   Procedure: ESOPHAGOGASTRODUODENOSCOPY (EGD) WITH PROPOFOL ;  Surgeon: Jinny Carmine, MD;  Location: [facility];  Service: Endoscopy;  Laterality: N/A;   ESOPHAGOGASTRODUODENOSCOPY (EGD) WITH PROPOFOL   01/05/2023   Procedure: ESOPHAGOGASTRODUODENOSCOPY (EGD) WITH PROPOFOL ;  Surgeon: Unk Corinn Skiff, MD;  Location: [facility];  Service: Gastroenterology;;    ESOPHAGUS SURGERY     GASTROSTOMY N/A 05/25/2023   Procedure: OPEN INSERTION OF GASTROSTOMY TUBE;  Surgeon: Jordis Laneta FALCON, MD;  Location: [facility];  Service: General;  Laterality: N/A;   IR IMAGING GUIDED PORT INSERTION  02/07/2023   IR REPLACE G-TUBE SIMPLE WO FLUORO  02/27/2024   IR REPLC GASTRO/COLONIC TUBE PERCUT W/FLUORO  09/12/2023   KNEE SURGERY Right    LIPOSUCTION WITH LIPOFILLING Right 06/10/2019   Procedure: right breast scar and capsule release with fat grafting;  Surgeon: Lowery Estefana RAMAN, DO;  Location: [facility];  Service: Plastics;  Laterality: Right;   POLYPECTOMY  01/05/2023   Procedure: POLYPECTOMY;  Surgeon: Unk Corinn Skiff, MD;  Location: [facility];  Service: Gastroenterology;;   [facility] REMOVAL     PORTACATH PLACEMENT Right 01/17/2018   Procedure: INSERTION PORT-A-CATH- RIGHT;  Surgeon: Dessa Reyes ORN, MD;  Location: [facility];  Service: General;  Laterality: Right;   PULMONARY THROMBECTOMY Bilateral 05/12/2023   Procedure: PULMONARY THROMBECTOMY;  Surgeon: Marea Selinda RAMAN, MD;  Location: [facility];  Service: Cardiovascular;  Laterality: Bilateral;   ROBOTIC ADRENALECTOMY Right 12/28/2016   Procedure: ROBOTIC ADRENALECTOMY;  Surgeon: Penne Knee, MD;  Location: [facility];  Service: Urology;  Laterality: Right;   SKIN CANCER EXCISION     THROAT SURGERY  1998   throat cancer    TUBAL LIGATION     VENTRAL HERNIA REPAIR N/A 03/03/2017   Procedure: HERNIA REPAIR VENTRAL ADULT;  Surgeon: Shelva Dunnings, MD;  Location: [facility];  Service: General;  Laterality: N/A;   VIDEO BRONCHOSCOPY WITH ENDOBRONCHIAL ULTRASOUND N/A 01/25/2023   Procedure: VIDEO BRONCHOSCOPY WITH ENDOBRONCHIAL ULTRASOUND;  Surgeon: Parris Manna, MD;  Location: [facility];  Service: Thoracic;  Laterality: N/A;   VIDEO BRONCHOSCOPY WITH ENDOBRONCHIAL ULTRASOUND N/A 02/17/2023   Procedure: VIDEO BRONCHOSCOPY WITH ENDOBRONCHIAL ULTRASOUND;  Surgeon: Parris Manna,  MD;  Location: [facility];  Service: Thoracic;  Laterality: N/A;   Social History:  reports that she quit smoking about 28 years ago. Her smoking use included cigarettes. She started smoking about 59 years ago. She has a 45 pack-year smoking history. She has been exposed to tobacco smoke. She has never used smokeless tobacco. She reports that she does not currently use alcohol after a past usage of about 4.0 standard drinks of alcohol per week. She reports that she does not use drugs.  Allergies[1]  Family History  Problem Relation Age of Onset   Diabetes Mother    Heart attack Mother    Cancer Sister        cancer of eyes, spread to lung, other places. died at 46   Cancer Sister 87       unknown primary, spread to bone, brain died in 64's   Cancer Paternal Aunt        unk type   Cancer Paternal Grandmother        type unk   Prostate cancer Neg Hx    Kidney cancer Neg Hx    Bladder Cancer Neg Hx    Breast cancer Neg Hx     Prior to Admission medications  Medication Sig Start Date End Date Taking? Authorizing Provider  albuterol  (VENTOLIN  HFA) 108 (90 Base) MCG/ACT inhaler Inhale 2 puffs into the lungs every 6 (six) hours as needed for wheezing or shortness of breath. 07/21/23   Pardue, Lauraine SAILOR, DO  apixaban  (ELIQUIS ) 5 MG TABS tablet TAKE 1 TABLET BY MOUTH TWICE A DAY 04/17/24   Kasa, Kurian, MD  benzonatate  (TESSALON ) 100 MG capsule Take 2 capsules (200 mg total) by mouth 3 (three) times daily as needed for cough. 06/05/23   Laurence Locus, DO  bisacodyl  (DULCOLAX) 10 MG suppository Place 1 suppository (10 mg total) rectally daily as needed for moderate constipation or severe constipation. 06/05/23   Laurence Locus, DO  buPROPion  (WELLBUTRIN ) 75 MG tablet Take 75 mg by mouth 2 (two) times daily.    [provider]  busPIRone  (BUSPAR ) 5 MG tablet Take 1 tablet (5 mg total) by mouth 3 (three) times daily as needed. 11/27/23   Denise Lauraine SAILOR, DO  cefdinir  (OMNICEF ) 300 MG capsule Take 1  capsule (300 mg total) by mouth 2 (two) times daily. 07/04/23   Denise Lauraine SAILOR, DO  cyclobenzaprine  (FLEXERIL ) 5 MG tablet Take 5 mg by mouth 3 (three) times daily as needed for muscle spasms.    [provider]  DULoxetine  (CYMBALTA ) 60 MG capsule TAKE 1 CAPSULE BY MOUTH DAILY 03/22/24   Pardue, Lauraine SAILOR, DO  fentaNYL  (DURAGESIC ) 12 MCG/HR Place 1 patch onto the skin every 3 (three) days. 04/16/24   Melanee Annah BROCKS, MD  fluticasone  (FLONASE ) 50 MCG/ACT nasal spray Place 2 sprays into both nostrils daily.  [provider]  Fluticasone -Umeclidin-Vilant (TRELEGY ELLIPTA ) 100-62.5-25 MCG/ACT AEPB Inhale 1 Act into the lungs daily. 08/30/23   Kasa, Kurian, MD  Fluticasone -Umeclidin-Vilant (TRELEGY ELLIPTA ) 100-62.5-25 MCG/ACT AEPB Inhale 1 puff into the lungs daily. 12/22/23   Kasa, Kurian, MD  GILOTRIF  30 MG tablet TAKE 1 TABLET DAILY ON AN EMPTY STOMACH AT LEAST 1 HOUR BEFORE OR 2 HOURS AFTER MEALS 03/22/24   Melanee Annah BROCKS, MD  ipratropium-albuterol  (DUONEB) 0.5-2.5 (3) MG/3ML SOLN Take 3 mLs by nebulization every 4 (four) hours as needed.    [provider]  lactulose , encephalopathy, (CHRONULAC ) 10 GM/15ML SOLN Place 30 mLs (20 g total) into feeding tube 2 (two) times daily. 11/27/23   Denise Lauraine SAILOR, DO  lidocaine  (LIDODERM ) 5 % Place 1 patch onto the skin daily. Remove & Discard patch within 12 hours or as directed by MD 08/25/23   Melanee Annah BROCKS, MD  meloxicam  (MOBIC ) 7.5 MG tablet Take 1 tablet (7.5 mg total) by mouth daily. 11/27/23   Denise Lauraine SAILOR, DO  metoprolol  succinate (TOPROL -XL) 25 MG 24 hr tablet TAKE 1 TABLET BY MOUTH DAILY 01/04/24   Pardue, Lauraine SAILOR, DO  montelukast  (SINGULAIR ) 10 MG tablet TAKE 1 TABLET BY MOUTH AT BEDTIME 01/22/24   Pardue, Lauraine SAILOR, DO  Nutritional Supplements (NUTREN 1.5) LIQD Give 1 carton 4 times a day (8am, noon, 4pm and 8pm). Flush with 60ml of water  before and after each feeding.  Give additional 1 1/2 cups of water  a day via tube or drink  orally for adequate hydration. 06/28/23   Melanee Annah BROCKS, MD  omeprazole  (KONVOMEP ) 2 mg/mL SUSP oral suspension Take 20 mLs (40 mg total) by mouth daily. 04/12/23   Borders, Fonda SAUNDERS, NP  ondansetron  (ZOFRAN ) 4 MG tablet Take 1 tablet (4 mg total) by mouth every 6 (six) hours as needed for nausea or vomiting. 06/05/23 06/04/24  Laurence Locus, DO  ondansetron  (ZOFRAN -ODT) 4 MG disintegrating tablet Take 1 tablet (4 mg total) by mouth every 8 (eight) hours as needed for nausea or vomiting. 04/01/24   Viviann Pastor, MD  oxyCODONE  (ROXICODONE ) 5 MG/5ML solution Take 5 mLs (5 mg total) by mouth every 8 (eight) hours as needed for severe pain (pain score 7-10). 04/12/24   Lanell Jacobsen, NP  oxyCODONE  (ROXICODONE ) 5 MG/5ML solution Take 5 mLs (5 mg total) by mouth every 8 (eight) hours as needed for severe pain (pain score 7-10). 04/16/24   Melanee Annah BROCKS, MD  predniSONE  (DELTASONE ) 20 MG tablet Take 1 tablet (20 mg total) by mouth daily with breakfast. 7 days 08/30/23   Isaiah Scrivener, MD  predniSONE  (DELTASONE ) 20 MG tablet Take 1 tablet (20 mg total) by mouth daily with breakfast for 7 days. 04/11/24 04/18/24  Simmons-Robinson, Rockie, MD  pregabalin (LYRICA) 25 MG capsule Take 1 capsule (25 mg total) by mouth 2 (two) times daily. 04/11/24   Simmons-Robinson, Rockie, MD  rOPINIRole  (REQUIP ) 0.5 MG tablet Take 1 tablet (0.5 mg total) by mouth 3 (three) times daily. 11/27/23   Denise Lauraine SAILOR, DO  rosuvastatin  (CRESTOR ) 40 MG tablet PLACE 1 TABLET (40 MG TOTAL) INTO FEEDING TUBE DAILY 02/26/24   Gasper Nancyann BRAVO, MD  senna (SENOKOT) 8.6 MG TABS tablet Place 1 tablet (8.6 mg total) into feeding tube daily. 06/06/23   Laurence Locus, DO  sucralfate  (CARAFATE ) 1 GM/10ML suspension Place 10 mLs (1 g total) into feeding tube 4 (four) times daily. 10/12/23   Denise Lauraine SAILOR, DO  tolterodine  (DETROL  LA) 4  MG 24 hr capsule Take 4 mg by mouth daily. 08/24/23   [provider]  triamcinolone  cream (KENALOG ) 0.1 % Apply  1 Application topically 2 (two) times daily. 04/24/23   [provider]  Water  For Irrigation, Sterile (FREE WATER ) SOLN Place 60 mLs into feeding tube 5 (five) times daily. 06/05/23   Laurence Locus, DO    Physical Exam: Vitals:   04/18/24 1102 04/18/24 1103 04/18/24 1250 04/18/24 1518  BP: 99/72   (!) 89/75  Pulse: (!) 110   (!) 105  Resp: 18   18  Temp: 97.9 F (36.6 C)   97.9 F (36.6 C)  TempSrc:    Oral  SpO2: 100%  100% 99%  Weight:  49 kg    Height:  5' 1 (1.549 m)     General - Elderly female, in no acute distress HEENT - PERRLA, EOMI, atraumatic head, non tender sinuses. Lung - Clear, basal rales, diffuse rhonchi, no wheezes. Heart - S1, S2 heard, no murmurs, rubs, trace pedal edema. Abdomen -gastrostomy tube in place with excoriation around G-tube site and bleeding noted Neuro - Alert, awake and oriented, non focal exam. Skin - Warm and dry.  Data Reviewed: CT scan of the abdomen and pelvis did not show any acute intra abdominal pathology..  Gastrostomy tube in place    Latest Ref Rng & Units 04/18/2024    1:21 PM 04/16/2024    8:50 AM 04/01/2024    2:08 PM  CBC  WBC 4.0 - 10.5 K/uL 9.2  9.5  8.6   Hemoglobin 12.0 - 15.0 g/dL 86.9  87.6  86.0   Hematocrit 36.0 - 46.0 % 41.5  38.2  43.0   Platelets 150 - 400 K/uL 180  198  174        Latest Ref Rng & Units 04/18/2024    1:21 PM 04/16/2024    8:50 AM 04/01/2024    2:08 PM  BMP  Glucose 70 - 99 mg/dL 852  893  870   BUN 8 - 23 mg/dL 22  20  14    Creatinine 0.44 - 1.00 mg/dL 9.25  9.25  9.29   Sodium 135 - 145 mmol/L 140  140  139   Potassium 3.5 - 5.1 mmol/L 4.4  4.2  4.1   Chloride 98 - 111 mmol/L 102  104  103   CO2 22 - 32 mmol/L 31  27  25    Calcium  8.9 - 10.3 mg/dL 8.7  9.2  9.0      Assessment and Plan:  Bleeding around gastrostomy tube site General Surgeon consulted Monitor for bleeding Monitor CBC Avoid anticoagulants  History of PE Avoid Eliquis   History of breast cancer Outpatient  follow-up with oncology  A-fib Currently rate controlled  Depression Continue duloxetine   COPD Continue home nebulization As needed nebs added  Coronary artery disease-continue statin therapy  GERD-continue PPI  Hyperlipidemia-continue statin therapy  Chronic diastolic congestive heart failure   Restless leg syndrome Continue ropinirole   DVT prophylaxis-placed on SCD given bleeding   Advance Care Planning:   Code Status: Prior full code  Consults: General Surgery  Family Communication: Husband  Severity of Illness: The appropriate patient status for this patient is OBSERVATION. Observation status is judged to be reasonable and necessary in order to provide the required intensity of service to ensure the patient's safety. The patient's presenting symptoms, physical exam findings, and initial radiographic and laboratory data in the context of their medical condition is felt to place  them at decreased risk for further clinical deterioration. Furthermore, it is anticipated that the patient will be medically stable for discharge from the hospital within 2 midnights of admission.   Author: Drue ONEIDA Potter, MD 04/18/2024 4:18 PM  For on call review [email or web address].      [1]  Allergies Allergen Reactions   Sulfa  Antibiotics Other (See Comments)    Mouth sore and raw

## 2024-04-18 NOTE — ED Notes (Signed)
 Pain meds given  family at bedside.  Siderails up x 2.  Pt alert.

## 2024-04-18 NOTE — ED Notes (Signed)
 Surgery with pt in hallway bed.

## 2024-04-18 NOTE — Telephone Encounter (Signed)
 Pt called crying and frantic stating that her G-tube is bleeding bad. I advised pt to go to the ED. I explained that Dr. Jordis was not in clinic today. Pt verbalized understanding and is heading to ED for evaluation.

## 2024-04-18 NOTE — ED Notes (Signed)
 Surgery at bedside.

## 2024-04-18 NOTE — ED Notes (Signed)
Pt in hallway bed' 

## 2024-04-18 NOTE — ED Provider Notes (Signed)
 [facility] Provider Note    Event Date/Time   First MD Initiated Contact with Patient 04/18/24 1252     (approximate)   History   feeding tube problem   HPI  Denise TEGELER is a 68 y.o. female with a history of breast cancer, vocal cord cancer, PE, atrial fibrillation, COPD, GERD, and hyperlipidemia who presents with bleeding and pain around her gastrostomy tube site.  The patient states that she has had some pain and bleeding there for some time, but then had an acute onset of more severe bleeding at the base of the tube site today.  She denies bleeding from the tube itself.  She denies any vomiting.  She has no fever or chills.  I reviewed the past medical records.  The patient's most recent outpatient encounter was on 12/16 with palliative medicine.  She reported pain and excoriation around her PEG tube at that time.  The patient was also seen by oncology.  Per the oncology note the PEG site is fragile and prone to bleeding, likely from skin fragility and tube movement.  The patient had the procedure to insert the gastrostomy tube on 1/23 of this year.   Physical Exam   Triage Vital Signs: ED Triage Vitals  Encounter Vitals Group     BP 04/18/24 1102 99/72     Girls Systolic BP Percentile --      Girls Diastolic BP Percentile --      Boys Systolic BP Percentile --      Boys Diastolic BP Percentile --      Pulse Rate 04/18/24 1102 (!) 110     Resp 04/18/24 1102 18     Temp 04/18/24 1102 97.9 F (36.6 C)     Temp src --      SpO2 04/18/24 1102 100 %     Weight 04/18/24 1103 108 lb 0.4 oz (49 kg)     Height 04/18/24 1103 5' 1 (1.549 m)     Head Circumference --      Peak Flow --      Pain Score 04/18/24 1103 5     Pain Loc --      Pain Education --      Exclude from Growth Chart --     Most recent vital signs: Vitals:   04/18/24 1250 04/18/24 1518  BP:  (!) 89/75  Pulse:  (!) 105  Resp:  18  Temp:  97.9 F (36.6 C)  SpO2: 100% 99%      General: Awake, no distress.  CV:  Good peripheral perfusion.  Resp:  Normal effort.  Abd:  Abdomen soft with some diffuse tenderness.  No distention.  Other:  Area around the G-tube site with granulation tissue, oozing a small amount of blood.  No palpable hematoma or other mass.  No active hemorrhage.   ED Results / Procedures / Treatments   Labs (all labs ordered are listed, but only abnormal results are displayed) Labs Reviewed  BASIC METABOLIC PANEL WITH GFR - Abnormal; Notable for the following components:      Result Value   Glucose, Bld 147 (*)    Calcium  8.7 (*)    All other components within normal limits  CBC WITH DIFFERENTIAL/PLATELET - Abnormal; Notable for the following components:   MCV 101.2 (*)    All other components within normal limits     EKG     RADIOLOGY  CT abdomen/pelvis: I independently viewed and interpreted the images;  the gastrostomy tube is in the stomach.  Radiology report indicates the following.  IMPRESSION:  1. No acute findings in the abdomen/pelvis.  2. Evidence of patient's percutaneous gastrostomy tube as the  balloon portion of the tube is not well-defined and is likely  misshapen containing small amount of air. Tube is in otherwise  adequate position. No concerning fluid collection along the tube  tract.  3. Mild colonic diverticulosis without active inflammation.  4. Couple small left renal cysts unchanged.  5. Interval improvement of previously seen multiple small bilateral  nodules over the lung bases in this patient with known metastatic  lung cancer.  6. Aortic atherosclerosis.    PROCEDURES:  Critical Care performed: No  Procedures   MEDICATIONS ORDERED IN ED: Medications  morphine  (PF) 4 MG/ML injection 4 mg (4 mg Intravenous Given 04/18/24 1318)  iohexol  (OMNIPAQUE ) 300 MG/ML solution 75 mL (75 mLs Intravenous Contrast Given 04/18/24 1424)  HYDROmorphone  (DILAUDID ) injection 1 mg (1 mg Intravenous Given  04/18/24 1518)  silver  nitrate applicators applicator 4 Stick (4 Sticks Topical Given 04/18/24 1550)     IMPRESSION / MDM / ASSESSMENT AND PLAN / ED COURSE  I reviewed the triage vital signs and the nursing notes.  68 year old female with PMH as noted above presents with recurrent bleeding from her gastrostomy tube site.  This appears to be an exacerbation of an issue she has had for some time.  Differential diagnosis includes, but is not limited to, fragile granulation tissue at the tube site, chronic irritation or inflammation, less likely hematoma or acute bleeding from the stomach or surrounding soft tissue.  We will obtain labs and CT for further evaluation, and give analgesia.  Patient's presentation is most consistent with severe exacerbation of chronic illness.  ----------------------------------------- 4:04 PM on 04/18/2024 -----------------------------------------  CT shows that the tube is in the stomach and does not show any acute complications.  BMP and CBC show no acute abnormalities.  I consulted PA Terryl from general surgery who evaluated the patient.  He feels that the patient may need a tube exchange by IR, and recommends that we admit the patient to the hospitalist service.   FINAL CLINICAL IMPRESSION(S) / ED DIAGNOSES   Final diagnoses:  Bleeding from gastrostomy tube site [facility])     Rx / DC Orders   ED Discharge Orders     None        Note:  This document was prepared using Dragon voice recognition software and may include unintentional dictation errors.   Jacolyn Pae, MD 04/18/24 [phone]

## 2024-04-18 NOTE — ED Triage Notes (Signed)
 Pt to ED for g tube bleeding started today. Blood noted to shirt and pants. Reports stopped bleeding after clot came out. No active bleeding at this time. Reports placed a couple months ago

## 2024-04-19 ENCOUNTER — Observation Stay: Admitting: Radiology

## 2024-04-19 DIAGNOSIS — K9429 Other complications of gastrostomy: Secondary | ICD-10-CM

## 2024-04-19 DIAGNOSIS — Z931 Gastrostomy status: Secondary | ICD-10-CM | POA: Diagnosis not present

## 2024-04-19 DIAGNOSIS — Z515 Encounter for palliative care: Secondary | ICD-10-CM

## 2024-04-19 DIAGNOSIS — K9421 Gastrostomy hemorrhage: Secondary | ICD-10-CM | POA: Diagnosis not present

## 2024-04-19 HISTORY — PX: IR REPLC GASTRO/COLONIC TUBE PERCUT W/FLUORO: IMG2333

## 2024-04-19 LAB — BASIC METABOLIC PANEL WITH GFR
Anion gap: 7 (ref 5–15)
BUN: 14 mg/dL (ref 8–23)
CO2: 30 mmol/L (ref 22–32)
Calcium: 8.5 mg/dL — ABNORMAL LOW (ref 8.9–10.3)
Chloride: 103 mmol/L (ref 98–111)
Creatinine, Ser: 0.72 mg/dL (ref 0.44–1.00)
GFR, Estimated: >60 mL/min
Glucose, Bld: 109 mg/dL — ABNORMAL HIGH (ref 70–99)
Potassium: 4.8 mmol/L (ref 3.5–5.1)
Sodium: 140 mmol/L (ref 135–145)

## 2024-04-19 LAB — CBC
HCT: 37.1 % (ref 36.0–46.0)
Hemoglobin: 12 g/dL (ref 12.0–15.0)
MCH: 33.1 pg (ref 26.0–34.0)
MCHC: 32.3 g/dL (ref 30.0–36.0)
MCV: 102.2 fL — ABNORMAL HIGH (ref 80.0–100.0)
Platelets: 157 K/uL (ref 150–400)
RBC: 3.63 MIL/uL — ABNORMAL LOW (ref 3.87–5.11)
RDW: 13.1 % (ref 11.5–15.5)
WBC: 6.8 K/uL (ref 4.0–10.5)
nRBC: 0 % (ref 0.0–0.2)

## 2024-04-19 LAB — HIV ANTIBODY (ROUTINE TESTING W REFLEX): HIV Screen 4th Generation wRfx: NONREACTIVE

## 2024-04-19 MED ORDER — IOHEXOL 300 MG/ML  SOLN
8.0000 mL | Freq: Once | INTRAMUSCULAR | Status: AC | PRN
  Administered 2024-04-19: 8 mL

## 2024-04-19 MED ORDER — BUSPIRONE HCL 10 MG PO TABS
5.0000 mg | ORAL_TABLET | Freq: Two times a day (BID) | ORAL | Status: DC
  Administered 2024-04-19 – 2024-04-20 (×2): 5 mg via ORAL
  Filled 2024-04-19 (×2): qty 1

## 2024-04-19 MED ORDER — OXYCODONE HCL 5 MG PO TABS
5.0000 mg | ORAL_TABLET | Freq: Four times a day (QID) | ORAL | Status: DC | PRN
  Administered 2024-04-19: 5 mg
  Filled 2024-04-19: qty 1

## 2024-04-19 MED ORDER — FENTANYL CITRATE (PF) 100 MCG/2ML IJ SOLN
INTRAMUSCULAR | Status: AC
  Filled 2024-04-19: qty 2

## 2024-04-19 MED ORDER — ROSUVASTATIN CALCIUM 10 MG PO TABS
40.0000 mg | ORAL_TABLET | Freq: Every day | ORAL | Status: DC
  Administered 2024-04-20: 40 mg via ORAL
  Filled 2024-04-19: qty 4

## 2024-04-19 MED ORDER — SILVER NITRATE-POT NITRATE 75-25 % EX MISC
CUTANEOUS | Status: AC
  Filled 2024-04-19: qty 10

## 2024-04-19 MED ORDER — OXYCODONE HCL 5 MG/5ML PO SOLN: 5.0000 mg | Freq: Four times a day (QID) | ORAL | Status: DC | PRN

## 2024-04-19 MED ORDER — OXYCODONE HCL 5 MG PO TABS
5.0000 mg | ORAL_TABLET | Freq: Four times a day (QID) | ORAL | Status: DC | PRN
  Administered 2024-04-20: 10 mg via ORAL
  Filled 2024-04-19: qty 2

## 2024-04-19 MED ORDER — ROPINIROLE HCL 1 MG PO TABS
0.5000 mg | ORAL_TABLET | Freq: Three times a day (TID) | ORAL | Status: DC
  Administered 2024-04-19 – 2024-04-20 (×2): 0.5 mg via ORAL
  Filled 2024-04-19 (×2): qty 1

## 2024-04-19 MED ORDER — MIDAZOLAM HCL 2 MG/2ML IJ SOLN
INTRAMUSCULAR | Status: AC
  Filled 2024-04-19: qty 2

## 2024-04-19 MED ORDER — FENTANYL CITRATE (PF) 100 MCG/2ML IJ SOLN
INTRAMUSCULAR | Status: AC | PRN
  Administered 2024-04-19: 50 ug via INTRAVENOUS

## 2024-04-19 MED ORDER — LIDOCAINE VISCOUS HCL 2 % MT SOLN
OROMUCOSAL | Status: AC
  Filled 2024-04-19: qty 15

## 2024-04-19 MED ORDER — BENZONATATE 100 MG PO CAPS
100.0000 mg | ORAL_CAPSULE | Freq: Three times a day (TID) | ORAL | Status: DC | PRN
  Administered 2024-04-19: 100 mg via ORAL
  Filled 2024-04-19: qty 1

## 2024-04-19 MED ORDER — GLUCAGON HCL RDNA (DIAGNOSTIC) 1 MG IJ SOLR
INTRAMUSCULAR | Status: AC
  Filled 2024-04-19: qty 1

## 2024-04-19 MED ORDER — GUAIFENESIN-DM 100-10 MG/5ML PO SYRP
5.0000 mL | ORAL_SOLUTION | ORAL | Status: DC | PRN
  Administered 2024-04-20 (×2): 5 mL via ORAL
  Filled 2024-04-19 (×2): qty 10

## 2024-04-19 MED ORDER — CEFAZOLIN SODIUM-DEXTROSE 2-4 GM/100ML-% IV SOLN
INTRAVENOUS | Status: AC
  Filled 2024-04-19: qty 100

## 2024-04-19 MED ORDER — SILVER NITRATE-POT NITRATE 75-25 % EX MISC
1.0000 | Freq: Once | CUTANEOUS | Status: AC
  Administered 2024-04-19: 4 via TOPICAL

## 2024-04-19 NOTE — Progress Notes (Signed)
 " Progress Note   Patient: Denise Watts FMW:[id number] DOB: 05/19/1955 DOA: 04/18/2024     0 DOS: the patient was seen and examined on 04/19/2024   Brief hospital course: Denise Watts is a 68 y.o. female with medical history significant of history of PE, adrenal carcinoma, history of B12 deficiency, history of breast cancer, A-fib, depression, COPD, coronary artery disease, GERD, hyperlipidemia, diastolic congestive heart failure, restless leg syndrome.  Patient comes into the emergency room today on account of bleeding around the gastrostomy tube site.  According to the patient she has been experiencing some bleeding intermittently around the tube site however this has not been that profuse compared to the bleeding seen today.  The bleeding stopped on the dressing around the tube site and subsequently patient came in for further management.  She is still on Eliquis  at home.  She denied nausea vomiting abdominal pain chest pain or cough   ED course: Upon arrival to the emergency room temperature 97.9, respiratory rate 18, heart rate 110, blood pressure 99/72 saturating 100% on room air. Given bleeding around the tube site General Surgery was contacted by ED physician who recommended admitting for close observation and symptom management and possible tube exchange.    Assessment and Plan:  Bleeding around gastrostomy tube site General Surgeon consulted General surgery is consulted IR for tube replacement Monitor for bleeding Monitor CBC Oncologist has agreed to hold off Eliquis  for at least 2 weeks   History of PE Avoid Eliquis    History of breast cancer Outpatient follow-up with oncology   A-fib Currently rate controlled   Depression Continue duloxetine    COPD Continue home nebulization As needed nebs added   Coronary artery disease-continue statin therapy   GERD-continue PPI   Hyperlipidemia-continue statin therapy   Chronic diastolic congestive heart failure    Restless  leg syndrome Continue ropinirole    DVT prophylaxis-placed on SCD given bleeding    Advance Care Planning:   Code Status: Prior full code   Consults: General Surgery   Family Communication: Husband   Subjective:  Seen and examined at bedside this morning Patient seen by IR for tube replacement with resumption of feeds Denies nausea vomiting abdominal pain chest pain cough   Physical Exam: General - Elderly female, in no acute distress HEENT - PERRLA, EOMI, atraumatic head, non tender sinuses. Lung - Clear, basal rales, diffuse rhonchi, no wheezes. Heart - S1, S2 heard, no murmurs, rubs, trace pedal edema. Abdomen -gastrostomy tube in place with excoriation around G-tube site and bleeding noted Neuro - Alert, awake and oriented, non focal exam. Skin - Warm and dry.  Vitals:   04/19/24 1338 04/19/24 1342 04/19/24 1420 04/19/24 1443  BP: (!) 117/58 99/61 118/77 (!) 107/55  Pulse: 99 98 (!) 104 100  Resp: (!) 22 (!) 21 (!) 26 20  Temp: 98.5 F (36.9 C)   98.4 F (36.9 C)  TempSrc: Oral  (P) Oral Oral  SpO2: 99% 99% 95% 97%  Weight:      Height:        Data Reviewed:    Latest Ref Rng & Units 04/19/2024    4:27 AM 04/18/2024    1:21 PM 04/16/2024    8:50 AM  CBC  WBC 4.0 - 10.5 K/uL 6.8  9.2  9.5   Hemoglobin 12.0 - 15.0 g/dL 87.9  86.9  87.6   Hematocrit 36.0 - 46.0 % 37.1  41.5  38.2   Platelets 150 - 400 K/uL 157  180  198        Latest Ref Rng & Units 04/19/2024    4:27 AM 04/18/2024    1:21 PM 04/16/2024    8:50 AM  BMP  Glucose 70 - 99 mg/dL 890  852  893   BUN 8 - 23 mg/dL 14  22  20    Creatinine 0.44 - 1.00 mg/dL 9.27  9.25  9.25   Sodium 135 - 145 mmol/L 140  140  140   Potassium 3.5 - 5.1 mmol/L 4.8  4.4  4.2   Chloride 98 - 111 mmol/L 103  102  104   CO2 22 - 32 mmol/L 30  31  27    Calcium  8.9 - 10.3 mg/dL 8.5  8.7  9.2      Author: Drue ONEIDA Potter, MD 04/19/2024 4:01 PM  For on call review [email or web address].  "

## 2024-04-19 NOTE — Plan of Care (Signed)

## 2024-04-19 NOTE — Progress Notes (Addendum)
 [facility] SURGICAL PROGRESS NOTE (cpt [phone])  Hospital Day(s): 0.   Interval History: Patient seen and examined, no acute events or new complaints overnight. Patient reports she continues to have discomfort at the gastrostomy tube site, worse with coughing - this is unchanged. She reports no further bleeding from this site since evaluation in the ED. Her Hgb remains normal; 12.0. She is hungry   Review of Systems:  Constitutional: denies fever, chills  HEENT: denies cough or congestion  Respiratory: denies any shortness of breath  Cardiovascular: denies chest pain or palpitations  Gastrointestinal: + abdominal pain (at gastrostomy); + G-tube leaking (improving)  Vital signs in last 24 hours: [min-max] current  Temp:  [97.9 F (36.6 C)-98.4 F (36.9 C)] 98.2 F (36.8 C) (12/18 1927) Pulse Rate:  [97-110] 99 (12/18 1927) Resp:  [18-19] 19 (12/18 1927) BP: (89-129)/(55-75) 129/68 (12/18 1927) SpO2:  [95 %-100 %] 95 % (12/18 1927) Weight:  [49 kg] 49 kg (12/18 1103)     Height: 5' 1 (154.9 cm) Weight: 49 kg BMI (Calculated): 20.42   Intake/Output last 2 shifts:  No intake/output data recorded.   Physical Exam:  Constitutional: catechetic appearing, sitting up in bed; NAD HENT: normocephalic without obvious abnormality  Eyes: PERRL, EOM's grossly intact and symmetric  Respiratory: breathing non-labored at rest  Cardiovascular: regular rate and sinus rhythm  Gastrointestinal: Soft, she is sore at gastrostomy tube site, this is dressed, no further bleeding at time of my evaluation    Labs:     Latest Ref Rng & Units 04/19/2024    4:27 AM 04/18/2024    1:21 PM 04/16/2024    8:50 AM  CBC  WBC 4.0 - 10.5 K/uL 6.8  9.2  9.5   Hemoglobin 12.0 - 15.0 g/dL 87.9  86.9  87.6   Hematocrit 36.0 - 46.0 % 37.1  41.5  38.2   Platelets 150 - 400 K/uL 157  180  198       Latest Ref Rng & Units 04/19/2024    4:27 AM 04/18/2024    1:21 PM 04/16/2024    8:50 AM  CMP   Glucose 70 - 99 mg/dL 890  852  893   BUN 8 - 23 mg/dL 14  22  20    Creatinine 0.44 - 1.00 mg/dL 9.27  9.25  9.25   Sodium 135 - 145 mmol/L 140  140  140   Potassium 3.5 - 5.1 mmol/L 4.8  4.4  4.2   Chloride 98 - 111 mmol/L 103  102  104   CO2 22 - 32 mmol/L 30  31  27    Calcium  8.9 - 10.3 mg/dL 8.5  8.7  9.2   Total Protein 6.5 - 8.1 g/dL   5.9   Total Bilirubin 0.0 - 1.2 mg/dL   0.4   Alkaline Phos 38 - 126 U/L   84   AST 15 - 41 U/L   45   ALT 0 - 44 U/L   42      Imaging studies: No new pertinent imaging studies   Assessment/Plan:  68 y.o. female with gastrostomy tube issues, complicated by significant, advanced, malignancy, failure to thrive    - This morning, drainage and bleeding from gastrostomy tube site seem to have resolved/improved significantly. Continue dressing as placed in ED yesterday. Change as needed. She will benefit from some barrier powder and gauze between disc and her skin.   - I did review case with IR again this morning. They  are graciously in agreement to reassess patient and discuss options, possible up-sizing, etc. Greatly appreciate their assistance.   - Again, from a surgical perspective, there is unfortunately not much we can offer additionally at this time aside from removal of this tube which she understands is a permanent choice.   - I did encouraged continued discussion with palliative care regarding goals of care. I do feel she is suffering. I encouraged her to think about her quality of life and what may be important to her moving forward. She has previous seen palliative care at the [facility].   - I will recommend NPO for now pending evaluation and discussion with IR. If nothing planned, she can return to baseline diet  All of the above findings and recommendations were discussed with the patient, and the medical team, and all of patient's questions were answered to her expressed satisfaction.  -- Arthea Platt, PA-C [facility] 04/19/2024, 7:25 AM M-F: 7am - 4pm

## 2024-04-19 NOTE — Procedures (Signed)
 Interventional Radiology Procedure Note  Procedure: Fluoro guided G tube upsizintg  Complications: None  Estimated Blood Loss: < 10 mL  Findings: Fluoro guided exchange of G tube 18 to a 20Fr G tube.  Chemical cautery with silver  nitrate.  Denise DELENA Banner, MD

## 2024-04-19 NOTE — Plan of Care (Signed)
   Problem: Education: Goal: Knowledge of General Education information will improve Description Including pain rating scale, medication(s)/side effects and non-pharmacologic comfort measures Outcome: Progressing

## 2024-04-19 NOTE — Care Management Obs Status (Signed)
 MEDICARE OBSERVATION STATUS NOTIFICATION   Patient Details  Name: Denise Watts MRN: [id number] Date of Birth: [birth year]   Medicare Observation Status Notification Given:  Yes    [name] 04/19/2024, 3:28 PM

## 2024-04-19 NOTE — TOC CM/SW Note (Signed)
 Transition of Care [facility]) - Inpatient Brief Assessment   Patient Details  Name: Denise Watts MRN: [id number] Date of Birth: 09-11-1955  Transition of Care [facility]) CM/SW Contact:    Corean ONEIDA Haddock, RN Phone Number: 04/19/2024, 12:33 PM   Clinical Narrative:    Transition of Care (TOC) Screening Note   Patient Details  Name: Denise Watts Date of Birth: 05/09/1955   Transition of Care [facility]) CM/SW Contact:    Corean ONEIDA Haddock, RN Phone Number: 04/19/2024, 12:33 PM    Transition of Care Department [facility]) has reviewed patient and no TOC needs have been identified at this time. We will continue to monitor patient advancement through interdisciplinary progression rounds. If new patient transition needs arise, please place a TOC consult.   Transition of Care Asessment: Insurance and Status: Insurance coverage has been reviewed Patient has primary care physician: Yes     Prior/Current Home Services: No current home services Social Drivers of Health Review: SDOH reviewed no interventions necessary Readmission risk has been reviewed: Yes Transition of care needs: no transition of care needs at this time

## 2024-04-19 NOTE — Consult Note (Signed)
 "    Palliative Medicine [facility] at [facility] [phone] [phone]   Name: Denise Watts Date: 04/19/2024 MRN: [id number]  DOB: April 08, 1956  Patient Care Team: [name], DO as PCP - General (Family Medicine) Darliss Rogue, MD as PCP - Cardiology (Cardiology) Melanee Annah BROCKS, MD as Consulting Physician (Oncology) Jordis Laneta FALCON, MD as Consulting Physician (General Surgery) Dasher, Alm LABOR, MD (Dermatology) Scheeler, Donnice PARAS, PA-C (Inactive) as Physician Assistant (Plastic Surgery) Dorothyann Drivers, MD as Attending Physician (Emergency Medicine) Ferrel, Lionel Sotero RIGGERS (Neurology) French Mering, MD as Referring Physician (Internal Medicine) Shellia Oh, MD as Consulting Physician (Pulmonary Disease) Pa, [facility] (Optometry) Verdene Gills, RN as Oncology Nurse Navigator Lenn Aran, MD as Consulting Physician (Radiation Oncology) Moises, Wanda, RN as [facility] [place], LCSW as [facility], [place], [place]    REASON FOR CONSULTATION: Denise Watts is a 68 y.o. 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  Patient was admitted to the hospital with bleeding around her PEG.  Palliative care was consulted to address goals. .   SOCIAL HISTORY:     reports that she quit smoking about 28 years ago. Her smoking use included cigarettes. She started smoking about 59 years ago. She has a 45 pack-year smoking history. She has been exposed to tobacco smoke. She has never used smokeless tobacco. She reports that she does not currently use alcohol after a past usage of about 4.0 standard drinks of alcohol per week. She reports that she does not use drugs.  Patient married lives at  home with her husband.  She has a son and three daughters.  Patient previously worked in designer, fashion/clothing.  ADVANCE DIRECTIVES:  Not on file  CODE STATUS: Full code  PAST MEDICAL HISTORY: Past Medical History:  Diagnosis Date   Acute pulmonary embolism (HCC) 05/10/2023   Adenomatous colon polyp    Adrenal carcinoma, right (HCC) 12/28/2016   a.) high grade carcinoma with extensive necrosis --> s/p adrenalectomy   Anxiety    Aortic atherosclerosis    Arthritis of right knee    Atrial fibrillation and flutter (HCC)    a.) CHA2DS2-VASc = 3 (age, sex, vascular disease history) as of 02/13/2023; b.) cardiac rate/rhythm maintained intrinsically without pharmacological intervention; no chronic OAC (does take low dose ASA)   B12 deficiency    Breast cancer (HCC) 1997   a.) stage IB (cT1a, cN0, cM0, G3, ER+, PR-, HER2-); s/p lumpectomy + XRT   COPD (chronic obstructive pulmonary disease) (HCC)    Coronary artery disease    a.) cCTA 12/09/2021: Ca2+ = 135 (80th %'ile for age/sex/race match contol) --> 25-49% pLAD and RCA distributions   Depression    Diastolic dysfunction    a.) TTE 12/11/2020, no RWMAs, norm RVSF, G1DD; b.) TTE 12/14/2021: EF 55-60, no RWMAs, norm RVSF, G1DD; c.) TTE 01/27/2023: EF 50-55%, no RWMAs, G1DD, mild RAE, mild MR, mod TR   Dry cough 03/02/2017   Dyspnea    Full dentures    GERD (gastroesophageal reflux disease)    History of hiatal hernia    HLD (hyperlipidemia)    Invasive carcinoma of breast (HCC) 11/15/2017   a.) path (+) for G3, ER -, PR -, HER2/neu - ; s/p lumpectomy 01/17/2018 + 4 cycles TC  systemic chemotherapy + XRT   Long term current use of aspirin     Personal history of chemotherapy    Personal history of radiation therapy    Pneumothorax of left lung after biopsy 01/25/2023   a.) hypoxic in PACU requiring re-intubation --> transferred to ICU --> chest tube placed by PCCM   RLS (restless legs syndrome)    a.) on ropinirole    Skin cancer    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   a.) Stage IB (cT1a, cN0, cM0, G3, ER+, PR-, HER2-)    PAST SURGICAL HISTORY:  Past Surgical History:  Procedure Laterality Date   BIOPSY  01/05/2023   Procedure: BIOPSY;  Surgeon: Unk Corinn Skiff, MD;  Location: [facility];  Service: Gastroenterology;;   BREAST BIOPSY Right 1997   positive   BREAST BIOPSY Right 11/15/2017   INVASIVE MAMMARY CARCINOMA triple negative   BREAST BIOPSY Right 07/20/2020   us  bx, Q marker, neg   BREAST LUMPECTOMY Right 1997   2019 also   BREAST LUMPECTOMY WITH SENTINEL LYMPH NODE BIOPSY Right 12/08/2017   Procedure: BREAST LUMPECTOMY WITH SENTINEL LYMPH NODE BX;  Surgeon: Dessa Reyes ORN, MD;  Location: [facility];  Service: General;  Laterality: Right;   BREAST REDUCTION WITH MASTOPEXY Left 06/10/2019   Procedure: Left breast mastopexy reduction for symmetry;  Surgeon: Lowery Estefana RAMAN, DO;  Location: [facility];  Service: Plastics;  Laterality: Left;  total 2.5 hours   CATARACT EXTRACTION W/PHACO Right 02/07/2022   Procedure: CATARACT EXTRACTION PHACO AND INTRAOCULAR LENS PLACEMENT (IOC) RIGHT DIABETIC;  Surgeon: Myrna Adine Anes, MD;  Location: [facility];  Service: Ophthalmology;  Laterality: Right;  3.60 00:34.7   CATARACT EXTRACTION W/PHACO Left 02/28/2022   Procedure: CATARACT EXTRACTION PHACO AND INTRAOCULAR LENS PLACEMENT (IOC) LEFT DIABETIC 4.11 00:26.1;  Surgeon: Myrna Adine Anes, MD;  Location: [facility];  Service: Ophthalmology;  Laterality: Left;   COLONOSCOPY  2015   COLONOSCOPY N/A 01/05/2023   Procedure: COLONOSCOPY;  Surgeon: Unk Corinn Skiff, MD;  Location: [facility];  Service: Gastroenterology;  Laterality: N/A;   COLONOSCOPY WITH PROPOFOL  N/A 10/29/2019   Procedure: COLONOSCOPY WITH PROPOFOL ;  Surgeon: Jinny Carmine, MD;  Location: [facility] END[facility]COPY;  Service: Endoscopy;  Laterality: N/A;   ESOPHAGOGASTRODUODENOSCOPY (EGD) WITH PROPOFOL  N/A 05/24/2019    Procedure: ESOPHAGOGASTRODUODENOSCOPY (EGD) WITH PROPOFOL ;  Surgeon: Jinny Carmine, MD;  Location: [facility];  Service: Endoscopy;  Laterality: N/A;   ESOPHAGOGASTRODUODENOSCOPY (EGD) WITH PROPOFOL  N/A 09/06/2022   Procedure: ESOPHAGOGASTRODUODENOSCOPY (EGD) WITH PROPOFOL ;  Surgeon: Jinny Carmine, MD;  Location: [facility];  Service: Endoscopy;  Laterality: N/A;   ESOPHAGOGASTRODUODENOSCOPY (EGD) WITH PROPOFOL   01/05/2023   Procedure: ESOPHAGOGASTRODUODENOSCOPY (EGD) WITH PROPOFOL ;  Surgeon: Unk Corinn Skiff, MD;  Location: [facility];  Service: Gastroenterology;;   ESOPHAGUS SURGERY     GASTROSTOMY N/A 05/25/2023   Procedure: OPEN INSERTION OF GASTROSTOMY TUBE;  Surgeon: Jordis Laneta FALCON, MD;  Location: [facility];  Service: General;  Laterality: N/A;   IR IMAGING GUIDED PORT INSERTION  02/07/2023   IR REPLACE G-TUBE SIMPLE WO FLUORO  02/27/2024   IR REPLC GASTRO/COLONIC TUBE PERCUT W/FLUORO  09/12/2023   KNEE SURGERY Right    LIPOSUCTION WITH LIPOFILLING Right 06/10/2019   Procedure: right breast scar and capsule release with fat grafting;  Surgeon: Lowery Estefana RAMAN, DO;  Location: [facility];  Service: Plastics;  Laterality: Right;   POLYPECTOMY  01/05/2023   Procedure: POLYPECTOMY;  Surgeon: Unk Corinn Skiff, MD;  Location: [facility];  Service: Gastroenterology;;   PORT-A-CATH REMOVAL     PORTACATH PLACEMENT Right 01/17/2018   Procedure: INSERTION PORT-A-CATH- RIGHT;  Surgeon: Dessa Reyes ORN, MD;  Location: [facility];  Service: General;  Laterality: Right;   PULMONARY THROMBECTOMY Bilateral 05/12/2023   Procedure: PULMONARY THROMBECTOMY;  Surgeon: Marea Selinda RAMAN, MD;  Location: [facility];  Service: Cardiovascular;  Laterality: Bilateral;   ROBOTIC ADRENALECTOMY Right 12/28/2016   Procedure: ROBOTIC ADRENALECTOMY;  Surgeon: Penne Knee, MD;  Location: [facility];  Service: Urology;  Laterality: Right;   SKIN CANCER EXCISION     THROAT SURGERY   1998   throat cancer    TUBAL LIGATION     VENTRAL HERNIA REPAIR N/A 03/03/2017   Procedure: HERNIA REPAIR VENTRAL ADULT;  Surgeon: Shelva Dunnings, MD;  Location: [facility];  Service: General;  Laterality: N/A;   VIDEO BRONCHOSCOPY WITH ENDOBRONCHIAL ULTRASOUND N/A 01/25/2023   Procedure: VIDEO BRONCHOSCOPY WITH ENDOBRONCHIAL ULTRASOUND;  Surgeon: Parris Manna, MD;  Location: [facility];  Service: Thoracic;  Laterality: N/A;   VIDEO BRONCHOSCOPY WITH ENDOBRONCHIAL ULTRASOUND N/A 02/17/2023   Procedure: VIDEO BRONCHOSCOPY WITH ENDOBRONCHIAL ULTRASOUND;  Surgeon: Parris Manna, MD;  Location: [facility];  Service: Thoracic;  Laterality: N/A;    HEMATOLOGY/ONCOLOGY HISTORY:  Oncology History Overview Note  1. Carcinoma of breast, T1, N0, M0 tumor diagnosis.  In January of 1997 2. Carcinoma of the vocal cord T1, N0, M0 tumor.  Status postradiation therapy 3. Abnormal CT scan of the chest (December, 2015) 4. Repeat CT scan of chest shows stable nodule (July, 2016) 5. Neutropenia with normal hemoglobin and normal platelet count (July, 2016)   Cancer of vocal cord [facility]) (Resolved)  11/10/2014 Initial Diagnosis   Cancer of vocal cord   Malignant neoplasm of lower-outer quadrant of right breast of female, estrogen receptor negative (HCC)  11/24/2017 Initial Diagnosis   Malignant neoplasm of lower-outer quadrant of right breast of female, estrogen receptor negative (HCC)   01/12/2018 Cancer Staging   Staging form: Breast, AJCC 8th Edition - Clinical stage from 01/12/2018: Stage IB (cT1a, cN0, cM0, G3, ER+, PR-, HER2-) - Signed by Melanee Annah BROCKS, MD on 01/12/2018   01/30/2018 - 04/26/2018 Chemotherapy   The patient had dexamethasone  (DECADRON ) 4 MG tablet, 8 mg, Oral, 2 times daily, 1 of 1 cycle, Start date: 01/12/2018, End date: 08/03/2018 palonosetron  (ALOXI ) injection 0.25 mg, 0.25 mg, Intravenous,  Once, 4 of 4 cycles Administration: 0.25 mg (01/30/2018), 0.25 mg (02/27/2018), 0.25 mg (03/28/2018),  0.25 mg (04/26/2018) pegfilgrastim  (NEULASTA  ONPRO KIT) injection 6 mg, 6 mg, Subcutaneous, Once, 4 of 4 cycles Administration: 6 mg (01/30/2018), 6 mg (02/27/2018), 6 mg (03/28/2018), 6 mg (04/26/2018) cyclophosphamide  (CYTOXAN ) 1,000 mg in sodium chloride  0.9 % 250 mL chemo infusion, 600 mg/m2 = 1,000 mg, Intravenous,  Once, 4 of 4 cycles Administration: 1,000 mg (01/30/2018), 1,000 mg (02/27/2018), 1,000 mg (03/28/2018), 1,000 mg (04/26/2018) DOCEtaxel  (TAXOTERE ) 130 mg in sodium chloride  0.9 % 250 mL chemo infusion, 75 mg/m2 = 130 mg, Intravenous,  Once, 4 of 4 cycles Dose modification: 60 mg/m2 (original dose 75 mg/m2, Cycle 2, Reason: Dose not tolerated) Administration: 130 mg (01/30/2018), 100 mg (02/27/2018), 100 mg (03/28/2018), 100 mg (04/26/2018)  for chemotherapy treatment.    Non-small cell lung cancer (HCC)  03/09/2023 Initial Diagnosis   Non-small cell lung cancer (HCC)   03/20/2023 -  Chemotherapy   Patient is on Treatment Plan : LUNG Carboplatin  + Paclitaxel  q7d     Primary malignant neoplasm  of right lung metastatic to other site [facility])  01/22/2024 Initial Diagnosis   Primary malignant neoplasm of right lung metastatic to other site [facility])   01/22/2024 Cancer Staging   Staging form: Lung, AJCC V9 - Clinical stage from 01/22/2024: rcT4, rcN3, rpM1 - Signed by Melanee Annah BROCKS, MD on 01/22/2024 Stage prefix: Recurrence     ALLERGIES:  is allergic to sulfa  antibiotics.  MEDICATIONS:  Current Facility-Administered Medications  Medication Dose Route Frequency Provider Last Rate Last Admin   budesonide -glycopyrrolate -formoterol  (BREZTRI ) 160-9-4.8 MCG/ACT inhaler 2 puff  2 puff Inhalation BID Dorinda Drue DASEN, MD   2 puff at 04/19/24 9160   busPIRone  (BUSPAR ) tablet 5 mg  5 mg Per Tube BID Djan, Prince T, MD   5 mg at 04/18/24 2102   DULoxetine  (CYMBALTA ) DR capsule 60 mg  60 mg Oral Daily Djan, Prince T, MD       oxyCODONE  (ROXICODONE ) 5 MG/5ML solution 5 mg  5 mg Oral Q8H PRN  Dorinda Drue DASEN, MD   5 mg at 04/19/24 0510   rOPINIRole  (REQUIP ) tablet 0.5 mg  0.5 mg Per Tube TID Djan, Prince T, MD   0.5 mg at 04/18/24 2102   rosuvastatin  (CRESTOR ) tablet 40 mg  40 mg Per Tube Daily Djan, Prince T, MD   40 mg at 04/18/24 2015   Facility-Administered Medications Ordered in Other Encounters  Medication Dose Route Frequency Provider Last Rate Last Admin   cyanocobalamin  (VITAMIN B12) injection 1,000 mcg  1,000 mcg Intramuscular Q30 days Rao, Archana C, MD   1,000 mcg at 03/16/22 1101    VITAL SIGNS: BP 112/67 (BP Location: Left Arm)   Pulse 98   Temp 98.3 F (36.8 C)   Resp 17   Ht 5' 1 (1.549 m)   Wt 108 lb 0.4 oz (49 kg)   SpO2 97%   BMI 20.41 kg/m  Filed Weights   04/18/24 1103  Weight: 108 lb 0.4 oz (49 kg)    Estimated body mass index is 20.41 kg/m as calculated from the following:   Height as of this encounter: 5' 1 (1.549 m).   Weight as of this encounter: 108 lb 0.4 oz (49 kg).  LABS: CBC:    Component Value Date/Time   WBC 6.8 04/19/2024 0427   HGB 12.0 04/19/2024 0427   HGB 12.3 04/16/2024 0850   HGB 14.0 11/03/2021 1551   HCT 37.1 04/19/2024 0427   HCT 42.1 11/03/2021 1551   PLT 157 04/19/2024 0427   PLT 198 04/16/2024 0850   PLT 216 11/03/2021 1551   MCV 102.2 (H) 04/19/2024 0427   MCV 97 11/03/2021 1551   MCV 100 04/08/2014 0409   NEUTROABS 7.3 04/18/2024 1321   NEUTROABS 3.5 11/03/2021 1551   NEUTROABS 6.1 04/08/2014 0409   LYMPHSABS 0.8 04/18/2024 1321   LYMPHSABS 1.2 11/03/2021 1551   LYMPHSABS 0.8 (L) 04/08/2014 0409   MONOABS 0.6 04/18/2024 1321   MONOABS 0.7 04/08/2014 0409   EOSABS 0.4 04/18/2024 1321   EOSABS 0.2 11/03/2021 1551   EOSABS 0.0 04/08/2014 0409   BASOSABS 0.0 04/18/2024 1321   BASOSABS 0.0 11/03/2021 1551   BASOSABS 0.0 04/08/2014 0409   Comprehensive Metabolic Panel:    Component Value Date/Time   NA 140 04/19/2024 0427   NA 145 (H) 11/03/2021 1551   NA 143 04/08/2014 0409   K 4.8 04/19/2024  0427   K 3.5 04/08/2014 0409   CL 103 04/19/2024 0427   CL 113 (H) 04/08/2014 0409  CO2 30 04/19/2024 0427   CO2 26 04/08/2014 0409   BUN 14 04/19/2024 0427   BUN 13 11/03/2021 1551   BUN 6 (L) 04/08/2014 0409   CREATININE 0.72 04/19/2024 0427   CREATININE 0.74 04/16/2024 0850   CREATININE 0.67 04/08/2014 0409   GLUCOSE 109 (H) 04/19/2024 0427   GLUCOSE 84 04/08/2014 0409   CALCIUM  8.5 (L) 04/19/2024 0427   CALCIUM  7.5 (L) 04/08/2014 0409   AST 45 (H) 04/16/2024 0850   ALT 42 04/16/2024 0850   ALT 21 11/26/2012 1019   ALKPHOS 84 04/16/2024 0850   ALKPHOS 87 11/26/2012 1019   BILITOT 0.4 04/16/2024 0850   PROT 5.9 (L) 04/16/2024 0850   PROT 6.3 11/03/2021 1551   PROT 6.4 11/26/2012 1019   ALBUMIN  3.6 04/16/2024 0850   ALBUMIN  4.1 11/03/2021 1551   ALBUMIN  3.5 11/26/2012 1019    RADIOGRAPHIC STUDIES: CT ABDOMEN PELVIS W CONTRAST Result Date: 04/18/2024 CLINICAL DATA:  Abdominal pain and bleeding around gastrostomy tube site. Gastrostomy tube exchange 02/27/2024. EXAM: CT ABDOMEN AND PELVIS WITH CONTRAST TECHNIQUE: Multidetector CT imaging of the abdomen and pelvis was performed using the standard protocol following bolus administration of intravenous contrast. RADIATION DOSE REDUCTION: This exam was performed according to the departmental dose-optimization program which includes automated exposure control, adjustment of the mA and/or kV according to patient size and/or use of iterative reconstruction technique. CONTRAST:  75mL OMNIPAQUE  IOHEXOL  300 MG/ML  SOLN COMPARISON:  12/13/2023, 05/26/2018 FINDINGS: Lower chest: Heart is normal size. Chronic changes over the right breast. Visualized lung bases demonstrate no acute process. Interval improvement of previously seen multiple small bilateral nodules over the lung bases in this patient with known metastatic lung cancer. Calcified plaque over the descending thoracic aorta. Hepatobiliary: Liver, gallbladder and biliary tree are normal.  Pancreas: Normal. Spleen: Normal. Adrenals/Urinary Tract: Left adrenal gland is normal. Right adrenal gland is not well visualized. Kidneys are normal in size without hydronephrosis or nephrolithiasis. Couple small left renal cysts unchanged. Ureters and bladder are normal. Stomach/Bowel: Evidence of patient's percutaneous gastrostomy tube as the balloon portion of the tube is not well-defined as likely some misshapen containing small amount of air. Tube is in otherwise adequate position. No concerning fluid collection along the tube tract. Small bowel is within normal. Appendix not visualized. Mild diverticulosis of the colon without active inflammation. Vascular/Lymphatic: Moderate calcified plaque over the abdominal aorta which is normal caliber. No adenopathy. Reproductive: Uterus and bilateral adnexa are unremarkable. Other: No significant free fluid or focal inflammatory change. Musculoskeletal: No focal abnormality. IMPRESSION: 1. No acute findings in the abdomen/pelvis. 2. Evidence of patient's percutaneous gastrostomy tube as the balloon portion of the tube is not well-defined and is likely misshapen containing small amount of air. Tube is in otherwise adequate position. No concerning fluid collection along the tube tract. 3. Mild colonic diverticulosis without active inflammation. 4. Couple small left renal cysts unchanged. 5. Interval improvement of previously seen multiple small bilateral nodules over the lung bases in this patient with known metastatic lung cancer. 6. Aortic atherosclerosis. Aortic Atherosclerosis (ICD10-I70.0). Electronically Signed   By: Toribio Agreste M.D.   On: 04/18/2024 15:20   DG Chest 2 View Result Date: 04/01/2024 EXAM: 2 VIEW(S) XRAY OF THE CHEST 04/01/2024 02:32:00 PM COMPARISON: Prior chest radiograph of 07/21/2023. PET of 12/27/2023. CLINICAL HISTORY: SHOB FINDINGS: LINES, TUBES AND DEVICES: Right Port-A-Cath tip at superior cavoatrial junction. LUNGS AND PLEURA: Left  perihilar soft tissue fullness is new or increased since the prior plain  film. Subtle pulmonary nodules including up to 11 mm in the left upper lobe. No pleural effusion. No pneumothorax. HEART AND MEDIASTINUM: Mild cardiomegaly. Atherosclerosis in the transverse aorta. BONES AND SOFT TISSUES: Remote left rib fractures. IMPRESSION: 1. Left perihilar soft tissue fullness, new or increased since the prior plain film, suspicious for mass/adenopathy. A component of this could be treatment related given known non-small cell lung cancer. 2. Pulmonary nodules, as before. Electronically signed by: Rockey Kilts MD 04/01/2024 03:25 PM EST RP Workstation: [id number]    PERFORMANCE STATUS (ECOG) : 1 - Symptomatic but completely ambulatory  Review of Systems Unless otherwise noted, a complete review of systems is negative.  Physical Exam General: Thin, frail-appearing Pulmonary: Unlabored Abdomen: PEG noted but not visualized Skin: no rashes Neurological: Weakness but otherwise nonfocal  IMPRESSION: Patient with stage IV non-small cell lung cancer currently on treatment with afatinib .  She was admitted to hospital with bleeding, drainage, excoriation around her PEG.  She has been seen by general surgery and is not felt to be a candidate for surgical revision.  I met with patient to discuss goals.  Patient says that her appetite and oral intake have improved but that she is still relying on the PEG for medication administration and that she does not wish to remove the PEG at this time.  She is concerned that she might require use of the PEG in the future for feedings.  Patient states clearly that her goal is to continue receiving cancer treatment with hope that it will prolong her life.  She says that she is not ready to leave.  And that she has multiple things that she needs to do to get her affairs in order such as settling property with her family.  Patient verbalizes a desire to remain a full code for  now but I did have a frank conversation with her regarding the probable futility associated with resuscitation in the setting of advanced cancer.  I recommended that she think about decision making and speak with her family.  Symptomatically, she endorses pain with slight improvement with oxycodone .  Patient feels that her pain is poorly controlled.  Will liberalize dosing of oxycodone .  PLAN: - Continue current scope of treatment - Liberalize oxycodone  5 to 10 mg every 6 hours as needed - Daily bowel regimen - Patient plans to speak with her family about decision making  Case and plan discussed with Dr. Melanee and Dr. Jordis   Time Total: 45 minutes  Visit consisted of counseling and education dealing with the complex and emotionally intense issues of symptom management and palliative care in the setting of serious and potentially life-threatening illness.Greater than 50%  of this time was spent counseling and coordinating care related to the above assessment and plan.  Signed by: Fonda Mower, PhD, NP-C   "

## 2024-04-19 NOTE — Progress Notes (Signed)
 Unsure at this time if new peg tube can be used. Called interventional radiology with no answer. Paged MD for IR and have not heard back from him if tube is ok to use.

## 2024-04-19 NOTE — Plan of Care (Signed)

## 2024-04-20 ENCOUNTER — Other Ambulatory Visit: Payer: Self-pay

## 2024-04-20 DIAGNOSIS — G893 Neoplasm related pain (acute) (chronic): Secondary | ICD-10-CM | POA: Diagnosis not present

## 2024-04-20 DIAGNOSIS — Z86711 Personal history of pulmonary embolism: Secondary | ICD-10-CM

## 2024-04-20 DIAGNOSIS — C3490 Malignant neoplasm of unspecified part of unspecified bronchus or lung: Secondary | ICD-10-CM | POA: Diagnosis not present

## 2024-04-20 DIAGNOSIS — Z931 Gastrostomy status: Secondary | ICD-10-CM

## 2024-04-20 DIAGNOSIS — Z7901 Long term (current) use of anticoagulants: Secondary | ICD-10-CM | POA: Diagnosis not present

## 2024-04-20 DIAGNOSIS — K9423 Gastrostomy malfunction: Secondary | ICD-10-CM | POA: Diagnosis not present

## 2024-04-20 DIAGNOSIS — R053 Chronic cough: Secondary | ICD-10-CM | POA: Diagnosis not present

## 2024-04-20 LAB — CBC WITH DIFFERENTIAL/PLATELET
Abs Immature Granulocytes: 0.03 K/uL (ref 0.00–0.07)
Basophils Absolute: 0 K/uL (ref 0.0–0.1)
Basophils Relative: 0 %
Eosinophils Absolute: 0.2 K/uL (ref 0.0–0.5)
Eosinophils Relative: 2 %
HCT: 36.4 % (ref 36.0–46.0)
Hemoglobin: 11.5 g/dL — ABNORMAL LOW (ref 12.0–15.0)
Immature Granulocytes: 0 %
Lymphocytes Relative: 10 %
Lymphs Abs: 0.9 K/uL (ref 0.7–4.0)
MCH: 31.5 pg (ref 26.0–34.0)
MCHC: 31.6 g/dL (ref 30.0–36.0)
MCV: 99.7 fL (ref 80.0–100.0)
Monocytes Absolute: 1 K/uL (ref 0.1–1.0)
Monocytes Relative: 11 %
Neutro Abs: 6.7 K/uL (ref 1.7–7.7)
Neutrophils Relative %: 77 %
Platelets: 159 K/uL (ref 150–400)
RBC: 3.65 MIL/uL — ABNORMAL LOW (ref 3.87–5.11)
RDW: 12.9 % (ref 11.5–15.5)
WBC: 8.7 K/uL (ref 4.0–10.5)
nRBC: 0 % (ref 0.0–0.2)

## 2024-04-20 LAB — BASIC METABOLIC PANEL WITH GFR
Anion gap: 9 (ref 5–15)
BUN: 12 mg/dL (ref 8–23)
CO2: 26 mmol/L (ref 22–32)
Calcium: 8.8 mg/dL — ABNORMAL LOW (ref 8.9–10.3)
Chloride: 102 mmol/L (ref 98–111)
Creatinine, Ser: 0.68 mg/dL (ref 0.44–1.00)
GFR, Estimated: >60 mL/min
Glucose, Bld: 122 mg/dL — ABNORMAL HIGH (ref 70–99)
Potassium: 4.3 mmol/L (ref 3.5–5.1)
Sodium: 137 mmol/L (ref 135–145)

## 2024-04-20 MED ORDER — AMOXICILLIN-POT CLAVULANATE 400-57 MG/5ML PO SUSR
800.0000 mg | Freq: Two times a day (BID) | ORAL | 0 refills | Status: AC
  Filled 2024-04-20: qty 100, 5d supply, fill #0

## 2024-04-20 NOTE — Consult Note (Signed)
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 Diagnosis 1. Bleeding from gastrostomy tube site (HCC)   2. Neoplasm related pain   3. Chronic cough     Dr. Annah  Melanee, MD, MPH [facility] [phone] 04/20/2024                   [1]  Allergies Allergen Reactions   Sulfa  Antibiotics Other (See Comments)    Mouth sore and raw  [2]  Current Facility-Administered Medications:    benzonatate  (TESSALON ) capsule 100 mg, 100 mg, Oral, TID PRN, Borders, Joshua R, NP, 100 mg at 04/19/24 2022   budesonide -glycopyrrolate -formoterol  (BREZTRI ) 160-9-4.8 MCG/ACT inhaler 2 puff, 2 puff, Inhalation, BID, Dorinda Drue DASEN, MD, 2 puff at 04/20/24 0802   busPIRone  (BUSPAR ) tablet 5 mg, 5 mg, Oral, BID, Rojelio Alan HERO, RPH, 5 mg at 04/20/24 9048   DULoxetine  (CYMBALTA ) DR capsule 60 mg, 60 mg, Oral, Daily, Djan, Drue DASEN, MD, 60 mg at 04/20/24 0950   guaiFENesin -dextromethorphan  (ROBITUSSIN DM) 100-10 MG/5ML syrup 5 mL, 5  mL, Oral, Q4H PRN, Dorinda Drue DASEN, MD, 5 mL at 04/20/24 0802   oxyCODONE  (Oxy IR/ROXICODONE ) immediate release tablet 5-10 mg, 5-10 mg, Oral, Q6H PRN, Rojelio Alan HERO, RPH, 10 mg at 04/20/24 0802   rOPINIRole  (REQUIP ) tablet 0.5 mg, 0.5 mg, Oral, TID, Rojelio Alan HERO, RPH, 0.5 mg at 04/20/24 9048   rosuvastatin  (CRESTOR ) tablet 40 mg, 40 mg, Oral, Daily, Rojelio Alan HERO, RPH, 40 mg at 04/20/24 9048  Facility-Administered Medications Ordered in Other Encounters:    cyanocobalamin  (VITAMIN B12) injection 1,000 mcg, 1,000 mcg, Intramuscular, Q30 days, Melanee Annah BROCKS, MD, 1,000 mcg at 03/16/22 1101  "

## 2024-04-20 NOTE — Discharge Summary (Signed)
 " Physician Discharge Summary   Patient: Denise Watts MRN: [id number] DOB: [birth year]  Admit date:     04/18/2024  Discharge date: 04/20/2024  Discharge Physician: Drue ONEIDA Potter   PCP: [name], DO   Recommendations at discharge:  Follow-up with oncology  Discharge Diagnoses: Active Problems:   Bleeding from gastrostomy tube site [facility])   Palliative care encounter  Resolved Problems:   * No resolved hospital problems. [facility] Course:  From HPI Denise Watts is a 68 y.o. female with medical history significant of history of PE, adrenal carcinoma, history of B12 deficiency, history of breast cancer, A-fib, depression, COPD, coronary artery disease, GERD, hyperlipidemia, diastolic congestive heart failure, restless leg syndrome.  Patient comes into the emergency room today on account of bleeding around the gastrostomy tube site.  According to the patient she has been experiencing some bleeding intermittently around the tube site however this has not been that profuse compared to the bleeding seen today.  The bleeding stopped on the dressing around the tube site and subsequently patient came in for further management.  She is still on Eliquis  at home.  She denied nausea vomiting abdominal pain chest pain or cough   ED course: Upon arrival to the emergency room temperature 97.9, respiratory rate 18, heart rate 110, blood pressure 99/72 saturating 100% on room air. Given bleeding around the tube site General Surgery was contacted by ED physician who recommended admitting for close observation and symptom management and possible tube exchange.       Assessment and Plan:  Bleeding around gastrostomy tube site General Surgeon consulted IR replaced gastrostomy tube upsize Monitor for bleeding Monitor CBC Oncologist has agreed to hold off Eliquis  for at least 2 weeks   History of PE Avoid Eliquis    History of breast cancer Outpatient follow-up with oncology    A-fib Currently rate controlled   Depression Continue duloxetine    COPD Continue home nebulization As needed nebs added   Coronary artery disease-continue statin therapy   GERD-continue PPI   Hyperlipidemia-continue statin therapy   Chronic diastolic congestive heart failure    Restless leg syndrome Continue ropinirole     Consultants: Oncology, general surgery, IR Procedures performed: As mentioned above Disposition: Home Diet recommendation: Clear liquid diet DISCHARGE MEDICATION: Allergies as of 04/20/2024       Reactions   Sulfa  Antibiotics Other (See Comments)   Mouth sore and raw        Medication List     STOP taking these medications    Eliquis  5 MG Tabs tablet Generic drug: apixaban    fluticasone  50 MCG/ACT nasal spray Commonly known as: FLONASE    metoprolol  succinate 25 MG 24 hr tablet Commonly known as: TOPROL -XL   predniSONE  20 MG tablet Commonly known as: DELTASONE        TAKE these medications    rOPINIRole  0.5 MG tablet Commonly known as: REQUIP  Take 1 tablet (0.5 mg total) by mouth 3 (three) times daily. The timing of this medication is very important.   albuterol  108 (90 Base) MCG/ACT inhaler Commonly known as: VENTOLIN  HFA Inhale 2 puffs into the lungs every 6 (six) hours as needed for wheezing or shortness of breath.   amoxicillin -clavulanate 400-57 MG/5ML suspension Commonly known as: AUGMENTIN  Take 10 mLs (800 mg total) by mouth 2 (two) times daily for 5 days.   benzonatate  100 MG capsule Commonly known as: TESSALON  Take 2 capsules (200 mg total) by mouth 3 (three) times daily as needed for cough.  bisacodyl  10 MG suppository Commonly known as: DULCOLAX Place 1 suppository (10 mg total) rectally daily as needed for moderate constipation or severe constipation.   busPIRone  5 MG tablet Commonly known as: BUSPAR  Take 1 tablet (5 mg total) by mouth 3 (three) times daily as needed.   cyclobenzaprine  5 MG  tablet Commonly known as: FLEXERIL  Take 5 mg by mouth 3 (three) times daily as needed for muscle spasms.   DULoxetine  60 MG capsule Commonly known as: CYMBALTA  TAKE 1 CAPSULE BY MOUTH DAILY   fentaNYL  12 MCG/HR Commonly known as: DURAGESIC  Place 1 patch onto the skin every 3 (three) days.   free water  Soln Place 60 mLs into feeding tube 5 (five) times daily.   Gilotrif  30 MG tablet Generic drug: afatinib  dimaleate TAKE 1 TABLET DAILY ON AN EMPTY STOMACH AT LEAST 1 HOUR BEFORE OR 2 HOURS AFTER MEALS   ipratropium-albuterol  0.5-2.5 (3) MG/3ML Soln Commonly known as: DUONEB Take 3 mLs by nebulization every 4 (four) hours as needed.   lactulose  (encephalopathy) 10 GM/15ML Soln Commonly known as: CHRONULAC  Place 30 mLs (20 g total) into feeding tube 2 (two) times daily.   lidocaine  5 % Commonly known as: LIDODERM  Place 1 patch onto the skin daily. Remove & Discard patch within 12 hours or as directed by MD   meloxicam  7.5 MG tablet Commonly known as: MOBIC  Take 1 tablet (7.5 mg total) by mouth daily.   montelukast  10 MG tablet Commonly known as: SINGULAIR  TAKE 1 TABLET BY MOUTH AT BEDTIME   Nutren 1.5 Liqd Give 1 carton 4 times a day (8am, noon, 4pm and 8pm). Flush with 60ml of water  before and after each feeding.  Give additional 1 1/2 cups of water  a day via tube or drink orally for adequate hydration.   omeprazole  2 mg/mL Susp oral suspension Commonly known as: KONVOMEP  Take 20 mLs (40 mg total) by mouth daily.   ondansetron  4 MG disintegrating tablet Commonly known as: ZOFRAN -ODT Take 1 tablet (4 mg total) by mouth every 8 (eight) hours as needed for nausea or vomiting.   oxyCODONE  5 MG/5ML solution Commonly known as: ROXICODONE  Take 5 mLs (5 mg total) by mouth every 8 (eight) hours as needed for severe pain (pain score 7-10). What changed: Another medication with the same name was removed. Continue taking this medication, and follow the directions you see here.    pregabalin 25 MG capsule Commonly known as: Lyrica Take 1 capsule (25 mg total) by mouth 2 (two) times daily.   rosuvastatin  40 MG tablet Commonly known as: CRESTOR  PLACE 1 TABLET (40 MG TOTAL) INTO FEEDING TUBE DAILY   senna 8.6 MG Tabs tablet Commonly known as: SENOKOT Place 1 tablet (8.6 mg total) into feeding tube daily.   sucralfate  1 GM/10ML suspension Commonly known as: CARAFATE  Place 10 mLs (1 g total) into feeding tube 4 (four) times daily.   tolterodine  4 MG 24 hr capsule Commonly known as: DETROL  LA Take 4 mg by mouth daily.   Trelegy Ellipta  100-62.5-25 MCG/ACT Aepb Generic drug: Fluticasone -Umeclidin-Vilant Inhale 1 Act into the lungs daily. What changed: Another medication with the same name was removed. Continue taking this medication, and follow the directions you see here.        Discharge Exam: Filed Weights   04/18/24 1103  Weight: 49 kg   General - Elderly female, in no acute distress HEENT - PERRLA, EOMI, atraumatic head, non tender sinuses. Lung - Clear, basal rales, diffuse rhonchi, no wheezes. Heart -  S1, S2 heard, no murmurs, rubs, trace pedal edema. Abdomen -gastrostomy tube in place Neuro - Alert, awake and oriented, non focal exam. Skin - Warm and dry.  Condition at discharge: good  The results of significant diagnostics from this hospitalization (including imaging, microbiology, ancillary and laboratory) are listed below for reference.   Imaging Studies: IR Replc Gastro/Colonic Tube Percut W/Fluoro Result Date: 04/19/2024 INDICATION: Indwelling gastrostomy tube (18 French) previously exchanged by IR now with episodic bleeding at the ostomy site as well as leakage of gastric contents onto the patient's skin. EXAM: Up sizing of gastrostomy tube under fluoroscopy ANESTHESIA/SEDATION: Moderate (conscious) sedation was NOT employed during this procedure. A total of Fentanyl  50 mcg was administered intravenously by the radiology nurse for pain  control. CONTRAST:  8 mL Omnipaque  300-administered into the gastric lumen. FLUOROSCOPY: Radiation Exposure Index (as provided by the fluoroscopic device): 1 mGy Kerma COMPLICATIONS: None immediate. PROCEDURE: Informed written consent was obtained from the patient after a thorough discussion of the procedural risks, benefits and alternatives. All questions were addressed. Maximal Sterile Barrier Technique was utilized including caps, mask, sterile gowns, sterile gloves, sterile drape, hand hygiene and skin antiseptic. A timeout was performed prior to the initiation of the procedure. In a supine position, the patient's epigastric region was prepped and draped in usual sterile fashion. Dilute contrast was injected through the indwelling gastrostomy tube demonstrating filling of the stomach and easy flowing of contrast through the tube and into the stomach lumen. No contrast extravasation. The retention balloon was deflated. A short Amplatz wire was advanced through the gastrostomy tube and coiled in the stomach. The gastrostomy tube was then removed from the patient. A new larger (20 French) gastrostomy tube was then advanced over the wire and placed in the stomach. Retention balloon was then filled with 10 mL normal saline. Gastrostomy tube was then partially retracted in the [facility] disc was closely applied to the patient's skin. Silver  nitrate was then liberally applied at the ostomy to cauterize any neovascularity that may be causing intermittent bleeding at the ostomy site. Sterile dressing was applied. IMPRESSION: Satisfactory up sizing of the gastrostomy tube from 76 French to 20 French. Successful chemical cautery of the neovascularity at the ostomy site using silver  nitrate. Electronically Signed   By: Cordella Banner   On: 04/19/2024 16:26   CT ABDOMEN PELVIS W CONTRAST Result Date: 04/18/2024 CLINICAL DATA:  Abdominal pain and bleeding around gastrostomy tube site. Gastrostomy tube exchange 02/27/2024.  EXAM: CT ABDOMEN AND PELVIS WITH CONTRAST TECHNIQUE: Multidetector CT imaging of the abdomen and pelvis was performed using the standard protocol following bolus administration of intravenous contrast. RADIATION DOSE REDUCTION: This exam was performed according to the departmental dose-optimization program which includes automated exposure control, adjustment of the mA and/or kV according to patient size and/or use of iterative reconstruction technique. CONTRAST:  75mL OMNIPAQUE  IOHEXOL  300 MG/ML  SOLN COMPARISON:  12/13/2023, 05/26/2018 FINDINGS: Lower chest: Heart is normal size. Chronic changes over the right breast. Visualized lung bases demonstrate no acute process. Interval improvement of previously seen multiple small bilateral nodules over the lung bases in this patient with known metastatic lung cancer. Calcified plaque over the descending thoracic aorta. Hepatobiliary: Liver, gallbladder and biliary tree are normal. Pancreas: Normal. Spleen: Normal. Adrenals/Urinary Tract: Left adrenal gland is normal. Right adrenal gland is not well visualized. Kidneys are normal in size without hydronephrosis or nephrolithiasis. Couple small left renal cysts unchanged. Ureters and bladder are normal. Stomach/Bowel: Evidence of patient's percutaneous gastrostomy tube  as the balloon portion of the tube is not well-defined as likely some misshapen containing small amount of air. Tube is in otherwise adequate position. No concerning fluid collection along the tube tract. Small bowel is within normal. Appendix not visualized. Mild diverticulosis of the colon without active inflammation. Vascular/Lymphatic: Moderate calcified plaque over the abdominal aorta which is normal caliber. No adenopathy. Reproductive: Uterus and bilateral adnexa are unremarkable. Other: No significant free fluid or focal inflammatory change. Musculoskeletal: No focal abnormality. IMPRESSION: 1. No acute findings in the abdomen/pelvis. 2. Evidence of  patient's percutaneous gastrostomy tube as the balloon portion of the tube is not well-defined and is likely misshapen containing small amount of air. Tube is in otherwise adequate position. No concerning fluid collection along the tube tract. 3. Mild colonic diverticulosis without active inflammation. 4. Couple small left renal cysts unchanged. 5. Interval improvement of previously seen multiple small bilateral nodules over the lung bases in this patient with known metastatic lung cancer. 6. Aortic atherosclerosis. Aortic Atherosclerosis (ICD10-I70.0). Electronically Signed   By: Toribio Agreste M.D.   On: 04/18/2024 15:20   DG Chest 2 View Result Date: 04/01/2024 EXAM: 2 VIEW(S) XRAY OF THE CHEST 04/01/2024 02:32:00 PM COMPARISON: Prior chest radiograph of 07/21/2023. PET of 12/27/2023. CLINICAL HISTORY: SHOB FINDINGS: LINES, TUBES AND DEVICES: Right Port-A-Cath tip at superior cavoatrial junction. LUNGS AND PLEURA: Left perihilar soft tissue fullness is new or increased since the prior plain film. Subtle pulmonary nodules including up to 11 mm in the left upper lobe. No pleural effusion. No pneumothorax. HEART AND MEDIASTINUM: Mild cardiomegaly. Atherosclerosis in the transverse aorta. BONES AND SOFT TISSUES: Remote left rib fractures. IMPRESSION: 1. Left perihilar soft tissue fullness, new or increased since the prior plain film, suspicious for mass/adenopathy. A component of this could be treatment related given known non-small cell lung cancer. 2. Pulmonary nodules, as before. Electronically signed by: Rockey Kilts MD 04/01/2024 03:25 PM EST RP Workstation: [id number]    Microbiology: Results for orders placed or performed during the hospital encounter of 04/01/24  Resp panel by RT-PCR (RSV, Flu A&B, Covid) Anterior Nasal Swab     Status: None   Collection Time: 04/01/24  2:08 PM   Specimen: Anterior Nasal Swab  Result Value Ref Range Status   SARS Coronavirus 2 by RT PCR NEGATIVE NEGATIVE Final     Comment: (NOTE) SARS-CoV-2 target nucleic acids are NOT DETECTED.  The SARS-CoV-2 RNA is generally detectable in upper respiratory specimens during the acute phase of infection. The lowest concentration of SARS-CoV-2 viral copies this assay can detect is 138 copies/mL. A negative result does not preclude SARS-Cov-2 infection and should not be used as the sole basis for treatment or other patient management decisions. A negative result may occur with  improper specimen collection/handling, submission of specimen other than nasopharyngeal swab, presence of viral mutation(s) within the areas targeted by this assay, and inadequate number of viral copies(<138 copies/mL). A negative result must be combined with clinical observations, patient history, and epidemiological information. The expected result is Negative.  Fact Sheet for Patients:  bloggercourse.com  Fact Sheet for Healthcare Providers:  seriousbroker.it  This test is no t yet approved or cleared by the [place]  FDA and  has been authorized for detection and/or diagnosis of SARS-CoV-2 by FDA under an Emergency Use Authorization (EUA). This EUA will remain  in effect (meaning this test can be used) for the duration of the COVID-19 declaration under Section 564(b)(1) of the Act, 21 U.S.C.section 360bbb-3(b)(1), unless  the authorization is terminated  or revoked sooner.       Influenza A by PCR NEGATIVE NEGATIVE Final   Influenza B by PCR NEGATIVE NEGATIVE Final    Comment: (NOTE) The Xpert Xpress SARS-CoV-2/FLU/RSV plus assay is intended as an aid in the diagnosis of influenza from Nasopharyngeal swab specimens and should not be used as a sole basis for treatment. Nasal washings and aspirates are unacceptable for Xpert Xpress SARS-CoV-2/FLU/RSV testing.  Fact Sheet for Patients: bloggercourse.com  Fact Sheet for Healthcare  Providers: seriousbroker.it  This test is not yet approved or cleared by the [place]  FDA and has been authorized for detection and/or diagnosis of SARS-CoV-2 by FDA under an Emergency Use Authorization (EUA). This EUA will remain in effect (meaning this test can be used) for the duration of the COVID-19 declaration under Section 564(b)(1) of the Act, 21 U.S.C. section 360bbb-3(b)(1), unless the authorization is terminated or revoked.     Resp Syncytial Virus by PCR NEGATIVE NEGATIVE Final    Comment: (NOTE) Fact Sheet for Patients: bloggercourse.com  Fact Sheet for Healthcare Providers: seriousbroker.it  This test is not yet approved or cleared by the [place]  FDA and has been authorized for detection and/or diagnosis of SARS-CoV-2 by FDA under an Emergency Use Authorization (EUA). This EUA will remain in effect (meaning this test can be used) for the duration of the COVID-19 declaration under Section 564(b)(1) of the Act, 21 U.S.C. section 360bbb-3(b)(1), unless the authorization is terminated or revoked.  Performed at [facility], [address] [place], [place] [address]    *Note: Due to a large number of results and/or encounters for the requested time period, some results have not been displayed. A complete set of results can be found in Results Review.    Labs: CBC: Recent Labs  Lab 04/16/24 0850 04/18/24 1321 04/19/24 0427 04/20/24 0357  WBC 9.5 9.2 6.8 8.7  NEUTROABS 7.7 7.3  --  6.7  HGB 12.3 13.0 12.0 11.5*  HCT 38.2 41.5 37.1 36.4  MCV 97.9 101.2* 102.2* 99.7  PLT 198 180 157 159   Basic Metabolic Panel: Recent Labs  Lab 04/16/24 0850 04/18/24 1321 04/19/24 0427 04/20/24 0357  NA 140 140 140 137  K 4.2 4.4 4.8 4.3  CL 104 102 103 102  CO2 27 31 30 26   GLUCOSE 106* 147* 109* 122*  BUN 20 22 14 12   CREATININE 0.74 0.74 0.72 0.68  CALCIUM  9.2 8.7*  8.5* 8.8*   Liver Function Tests: Recent Labs  Lab 04/16/24 0850  AST 45*  ALT 42  ALKPHOS 84  BILITOT 0.4  PROT 5.9*  ALBUMIN  3.6   CBG: No results for input(s): GLUCAP in the last 168 hours.  Discharge time spent:  33 minutes.  Signed: Drue ONEIDA Potter, MD [facility] 04/20/2024 "

## 2024-04-26 ENCOUNTER — Ambulatory Visit: Payer: Self-pay

## 2024-04-26 NOTE — Telephone Encounter (Signed)
 FYI Only or Action Required?: Action required by provider: clinical question for provider and requesting medication for thrush.  Patient was last seen in primary care on 04/11/2024 by Sharma Coyer, MD.  Called Nurse Triage reporting Mouth Lesions.  Symptoms began 4 days ago.  Interventions attempted: Rest, hydration, or home remedies.  Symptoms are: unchanged.  Triage Disposition: See HCP Within 4 Hours (Or PCP Triage)  Patient/caregiver understands and will follow disposition?: No, wishes to speak with PCP  Message from [facility] sent at 04/26/2024  1:45 PM EST  Reason for Triage: patient has thrush on tongue and in throat and requesting antibiotics [id number]   Reason for Disposition  [1] SEVERE mouth pain (e.g., excruciating) AND [2] not improved after 2 hours of pain medicine  Answer Assessment - Initial Assessment Questions Patient reports concerns for thrush to her tongue. Patient states she feels that this happened due to her inhalers. Patient endorses pain when eating or drinking. Patient is asking for medication.   1. ONSET: When did your mouth start hurting? (e.g., hours or days ago)      Pain started four days ago-centered to her tongue 2. SEVERITY: How bad is the pain? (Scale 1-10; or mild, moderate or severe)     6 out of 10 3. SORES: Are there any sores or ulcers in the mouth? If Yes, ask: What part of the mouth are the sores in?     Blisters on her tongue 4. FEVER: Do you have a fever? If Yes, ask: What is your temperature, how was it measured, and when did it start?     no 5. CAUSE: What do you think is causing the mouth pain?     Patient is concerned for thrush 6. OTHER SYMPTOMS: Do you have any other symptoms? (e.g., difficulty breathing)     no  Protocols used: Mouth Pain-A-AH

## 2024-04-29 ENCOUNTER — Ambulatory Visit: Payer: Self-pay

## 2024-04-29 ENCOUNTER — Other Ambulatory Visit: Payer: Self-pay | Admitting: *Deleted

## 2024-04-29 DIAGNOSIS — R52 Pain, unspecified: Secondary | ICD-10-CM

## 2024-04-29 DIAGNOSIS — C3491 Malignant neoplasm of unspecified part of right bronchus or lung: Secondary | ICD-10-CM

## 2024-04-29 MED ORDER — OXYCODONE HCL 5 MG/5ML PO SOLN: 5.0000 mg | Freq: Three times a day (TID) | ORAL | 0 refills | Status: DC | PRN

## 2024-04-29 NOTE — Telephone Encounter (Signed)
 Patient called triage to request a RF on oxycodone  5 mg solution. Script pended. Please consider RF request.

## 2024-04-29 NOTE — Telephone Encounter (Signed)
 FYI Only or Action Required?: FYI only for provider: F/U with Surgeon.  Patient was last seen in primary care on 04/11/2024 by Sharma Coyer, MD.  Called Nurse Triage reporting feeding tube leaking and burning.  Symptoms began several weeks ago.  Interventions attempted: Prescription medications: abx.  Symptoms are: unchanged.  Triage Disposition: See HCP Within 4 Hours (Or PCP Triage)  Patient/caregiver understands and will follow disposition?: Yes Reason for Disposition  [1] G-tube (or PEG), J-tube, or GJ-tube tube SITE looks infected  (spreading redness) AND [2] fever    No fever  Answer Assessment - Initial Assessment Questions Patient states she cleans the tube herself, states the area around the tube is red and raw. Reports the burning sensation is so bad. Patient was admitted to hospital for bleeding from tube, they made the tube bigger, changed it patient states it still sometimes bleeding, burns. Patient reports taking abx. Advised patient to call surgeon and go to ED for symptoms of continuing bleeding and burning.   1. MAIN CONCERN OR SYMPTOM:  What is your main concern right now? (e.g., blocked tube, pain, redness, swelling) What question do you have?      Leaking every time drinking, bleeding and burns occasionally.   2. ONSET: When did the symptom or problem start (or worsen)? (e.g., minutes, hours, days, weeks)     Few weeks  3. WHAT TYPE: What type of feeding tube is it? (e.g., NG tube, NJ tube, G tube, GJ tube, J tube, PEG).     PEG   4. WHEN INSERTED: When was the tube put in, Has it been changed, if so when?     Patient has had it for 4-5 weeks, patient does not change the tube  5. WHO INSERTED: Who put the tube in?     Surgeon at the hospital, patient does not know name  6. TUBE FEEDINGS: Has the type, rate or concentration of the tube feedings changed?     Denies  7. TUBE FLUSHING: Have you been able to flush the tube? How  often do you flush the tube?     Yes  8. MEDICINES:  Are you taking any medicines through your feeding tube?       Yes and vitamins  9. VOMITING and DIARRHEA: Is there new vomiting or diarrhea? (e.g., number of time; description)     Denies  10. OTHER SYMPTOMS: What other symptoms are you having? (e.g., breathing diffculty, fever)       Denies  11. HOME HEALTH NURSE: Do you have a home health (visiting) nurse?       Does not  Protocols used: Feeding Tube Symptoms and Questions-A-AH  Copied from [facility] #[id number]. Topic: Clinical - Red Word Triage >> Apr 29, 2024  3:41 PM Montie POUR wrote: Red Word that prompted transfer to Nurse Triage:  Everything she drinks comes through her feeding tube and it burns really bad and makes her cry. She wants to know what to do. She takes care of it herself.

## 2024-04-30 ENCOUNTER — Encounter: Payer: Self-pay | Admitting: Oncology

## 2024-05-06 ENCOUNTER — Telehealth: Payer: Self-pay | Admitting: *Deleted

## 2024-05-06 NOTE — Telephone Encounter (Signed)
 Patient called and left a vm on triage requesting a return phone call. Call returned. Caller verified using pt's full name and dob prior to discussing PHI  She would like the feeding to be removed asap. She stated, Im tired of having it and I want it out asap next week.  I explained to the patient that I would like Dr. Melanee and her team know her wishes. However, Dr. Melanee would most likely want to wait on the upcoming pet scan results. I explained to her that often the tube removal requires several disciplines including involving Joli and our speech therapist in outpatient rehab to determine if it's safe to remove the tube. She stated that I don't care. I just want it out. I explained that her safety is our priority and we need to have the provider to advise us  on the timing of the removal of the tube feeding. Pt reassured that I would send msg to team and they would contact her back with recommendations.

## 2024-05-07 ENCOUNTER — Encounter: Payer: Self-pay | Admitting: Oncology

## 2024-05-07 ENCOUNTER — Other Ambulatory Visit: Payer: Self-pay

## 2024-05-07 DIAGNOSIS — R1310 Dysphagia, unspecified: Secondary | ICD-10-CM

## 2024-05-07 NOTE — Telephone Encounter (Signed)
 Powell, did you mean 05/09/2023? Just seen message and wanted to clarify date for appt. Thanks

## 2024-05-07 NOTE — Telephone Encounter (Signed)
 Josh- please get in touch with her and make sure she understands the pros and cons of keeping versus taking the tube out.  She is not going to ever get the tube reinserted.  However if the PEG tube is making matters worse and she is continuing to bleed from that site it is probably better to get it out

## 2024-05-07 NOTE — Telephone Encounter (Signed)
 Orders placed for ambulatory referral to Speech Pathology and ancillary order - [id number]. Orders requested by Claudetta Agee.

## 2024-05-08 ENCOUNTER — Other Ambulatory Visit: Payer: Self-pay

## 2024-05-08 ENCOUNTER — Ambulatory Visit
Admission: RE | Admit: 2024-05-08 | Discharge: 2024-05-08 | Disposition: A | Discharge status: Home / Self Care | Source: Ambulatory Visit | Attending: Hospice and Palliative Medicine | Admitting: Hospice and Palliative Medicine

## 2024-05-08 ENCOUNTER — Inpatient Hospital Stay: Attending: Oncology | Admitting: Hospice and Palliative Medicine

## 2024-05-08 ENCOUNTER — Other Ambulatory Visit: Payer: Self-pay | Admitting: Radiology

## 2024-05-08 ENCOUNTER — Encounter: Payer: Self-pay | Admitting: Hospice and Palliative Medicine

## 2024-05-08 ENCOUNTER — Inpatient Hospital Stay

## 2024-05-08 ENCOUNTER — Telehealth: Payer: Self-pay

## 2024-05-08 VITALS — BP 107/56 | HR 83 | Temp 97.6°F | Wt 101.0 lb

## 2024-05-08 DIAGNOSIS — C7802 Secondary malignant neoplasm of left lung: Secondary | ICD-10-CM | POA: Diagnosis not present

## 2024-05-08 DIAGNOSIS — C3491 Malignant neoplasm of unspecified part of right bronchus or lung: Secondary | ICD-10-CM | POA: Diagnosis not present

## 2024-05-08 DIAGNOSIS — C7801 Secondary malignant neoplasm of right lung: Secondary | ICD-10-CM | POA: Diagnosis not present

## 2024-05-08 DIAGNOSIS — Z931 Gastrostomy status: Secondary | ICD-10-CM | POA: Diagnosis not present

## 2024-05-08 DIAGNOSIS — L299 Pruritus, unspecified: Secondary | ICD-10-CM | POA: Insufficient documentation

## 2024-05-08 DIAGNOSIS — E878 Other disorders of electrolyte and fluid balance, not elsewhere classified: Secondary | ICD-10-CM

## 2024-05-08 DIAGNOSIS — R1310 Dysphagia, unspecified: Secondary | ICD-10-CM

## 2024-05-08 DIAGNOSIS — Z87891 Personal history of nicotine dependence: Secondary | ICD-10-CM | POA: Diagnosis not present

## 2024-05-08 DIAGNOSIS — K9429 Other complications of gastrostomy: Secondary | ICD-10-CM

## 2024-05-08 DIAGNOSIS — C7951 Secondary malignant neoplasm of bone: Secondary | ICD-10-CM | POA: Insufficient documentation

## 2024-05-08 DIAGNOSIS — R52 Pain, unspecified: Secondary | ICD-10-CM

## 2024-05-08 DIAGNOSIS — Z853 Personal history of malignant neoplasm of breast: Secondary | ICD-10-CM | POA: Diagnosis not present

## 2024-05-08 DIAGNOSIS — R1314 Dysphagia, pharyngoesophageal phase: Secondary | ICD-10-CM | POA: Diagnosis present

## 2024-05-08 DIAGNOSIS — T85848D Pain due to other internal prosthetic devices, implants and grafts, subsequent encounter: Secondary | ICD-10-CM

## 2024-05-08 DIAGNOSIS — Z808 Family history of malignant neoplasm of other organs or systems: Secondary | ICD-10-CM | POA: Diagnosis not present

## 2024-05-08 DIAGNOSIS — G893 Neoplasm related pain (acute) (chronic): Secondary | ICD-10-CM | POA: Insufficient documentation

## 2024-05-08 DIAGNOSIS — Z8521 Personal history of malignant neoplasm of larynx: Secondary | ICD-10-CM | POA: Diagnosis not present

## 2024-05-08 DIAGNOSIS — L03011 Cellulitis of right finger: Secondary | ICD-10-CM | POA: Insufficient documentation

## 2024-05-08 DIAGNOSIS — L03012 Cellulitis of left finger: Secondary | ICD-10-CM | POA: Diagnosis not present

## 2024-05-08 LAB — CBC WITH DIFFERENTIAL/PLATELET
Abs Immature Granulocytes: 0.03 K/uL (ref 0.00–0.07)
Basophils Absolute: 0 K/uL (ref 0.0–0.1)
Basophils Relative: 0 %
Eosinophils Absolute: 0.2 K/uL (ref 0.0–0.5)
Eosinophils Relative: 4 %
HCT: 33.5 % — ABNORMAL LOW (ref 36.0–46.0)
Hemoglobin: 10.6 g/dL — ABNORMAL LOW (ref 12.0–15.0)
Immature Granulocytes: 1 %
Lymphocytes Relative: 12 %
Lymphs Abs: 0.6 K/uL — ABNORMAL LOW (ref 0.7–4.0)
MCH: 31 pg (ref 26.0–34.0)
MCHC: 31.6 g/dL (ref 30.0–36.0)
MCV: 98 fL (ref 80.0–100.0)
Monocytes Absolute: 0.4 K/uL (ref 0.1–1.0)
Monocytes Relative: 8 %
Neutro Abs: 3.5 K/uL (ref 1.7–7.7)
Neutrophils Relative %: 75 %
Platelets: 279 K/uL (ref 150–400)
RBC: 3.42 MIL/uL — ABNORMAL LOW (ref 3.87–5.11)
RDW: 12.6 % (ref 11.5–15.5)
WBC: 4.6 K/uL (ref 4.0–10.5)
nRBC: 0 % (ref 0.0–0.2)

## 2024-05-08 LAB — COMPREHENSIVE METABOLIC PANEL WITH GFR
ALT: 8 U/L (ref 0–44)
AST: 27 U/L (ref 15–41)
Albumin: 3.5 g/dL (ref 3.5–5.0)
Alkaline Phosphatase: 81 U/L (ref 38–126)
Anion gap: 11 (ref 5–15)
BUN: 16 mg/dL (ref 8–23)
CO2: 30 mmol/L (ref 22–32)
Calcium: 9.2 mg/dL (ref 8.9–10.3)
Chloride: 99 mmol/L (ref 98–111)
Creatinine, Ser: 0.68 mg/dL (ref 0.44–1.00)
GFR, Estimated: >60 mL/min
Glucose, Bld: 106 mg/dL — ABNORMAL HIGH (ref 70–99)
Potassium: 3.7 mmol/L (ref 3.5–5.1)
Sodium: 140 mmol/L (ref 135–145)
Total Bilirubin: 0.3 mg/dL (ref 0.0–1.2)
Total Protein: 6.3 g/dL — ABNORMAL LOW (ref 6.5–8.1)

## 2024-05-08 LAB — MAGNESIUM: Magnesium: 2.1 mg/dL (ref 1.7–2.4)

## 2024-05-08 LAB — PHOSPHORUS: Phosphorus: 2.8 mg/dL (ref 2.5–4.6)

## 2024-05-08 MED ORDER — OXYCODONE HCL 5 MG/5ML PO SOLN: 5.0000 mg | Freq: Three times a day (TID) | ORAL | 0 refills | Status: DC | PRN

## 2024-05-08 NOTE — Telephone Encounter (Signed)
 Called Wound Care to get patient a urgent visit. The 1st available is not until 06/25/2024 at 2 pm. Santina ahead and scheduled her and put on waiting list. Her case will presented to Doctor, see if we can get her in sooner. Patient given date and appt time, also number to call clinic for new patient information. Patient verbalized understanding.

## 2024-05-08 NOTE — Progress Notes (Signed)
 Nutrition Follow-up:  Patient with metastatic non-small cell lung cancer with bone and lymph nod metastasis.  Followed by Dr Melanee.  PEG placed on 05/25/23.  Patient receiving afatinib  and zometa .  MBSS today Seen by Palliative Care today prior to RD visit.  Patient confirms that she wants to keep PEG in place.  Has been seen by IR and referral to wound clinic has been made.    Patient reports that she has only been giving 1 carton of nutren 1.5 via feeding tube a day for the last week.  Says that she has not felt good and did not feel like giving more nutrition.  Reports that she does not have pain with giving water  and nutrition via tube.  Prior to the last week was giving 3 cartons of nutren 1.5 via feeding tube.  Has been eating soft/puree foods orally (eggs, mashed potatoes).     Medications: reviewed  Labs: K, phosphorus and Mag all normal  Anthropometrics:   Weight 101 lb today 106 lb on 01/26/24 106 lb 4 oz on 7/25 107 lb 2 oz on 4/25 109 lb on 3/26 111 lb on 2/21 117 lb on 2/3 119 lb 4.3 oz on 05/30/23   Estimated Energy Needs  Kcals: 1500-1750 Protein: 50-60 g Fluid: 1500-1750 ml  NUTRITION DIAGNOSIS: Inadequate oral intake continues   INTERVENTION:  Recommend 4 cartons of nutren 1.5 via tube to meet nutritional needs as source of nutrition needs to be coming from tube feeding.  Flush with 60 ml of water  before and after each feeding.   Will need to titrate slowly as refeeding risk.  Patient will start today with 2 cartons of nutren 1.5 and will add 1 carton daily until she reaches 4 cartons per day.  Written instructions provided.  Patient verbalized understanding.  Recommend juven BID via feeding tube for wound healing.  Samples given to patient today.  Will need to dissolve in water  prior to delivering in feeding tube.   Written instructions provided to patient.  Patient verbalized understanding.  She confirms that she has plenty of formula and syringes at home.    MONITORING, EVALUATION, GOAL:  Tube feeding, weight trends  NEXT VISIT: Friday, Jan 9 in clinic  Isha Seefeld B. Dasie SOLON, CSO, LDN Registered Dietitian [phone]

## 2024-05-08 NOTE — Progress Notes (Signed)
 "  [facility] [phone] [phone]  Patient Care Team: [name], DO as PCP - General (Family Medicine) Darliss Rogue, MD as PCP - Cardiology (Cardiology) Melanee Annah BROCKS, MD as Consulting Physician (Oncology) Jordis Laneta FALCON, MD as Consulting Physician (General Surgery) Dasher, Alm LABOR, MD (Dermatology) Scheeler, Donnice PARAS, PA-C (Inactive) as Physician Assistant (Plastic Surgery) Dorothyann Drivers, MD as Attending Physician (Emergency Medicine) Ferrel, Lionel Sotero RIGGERS (Neurology) French Mering, MD as Referring Physician (Internal Medicine) Shellia Oh, MD as Consulting Physician (Pulmonary Disease) Pa, [facility] (Optometry) Verdene Gills, RN as Oncology Nurse Navigator Lenn Aran, MD as Consulting Physician (Radiation Oncology) Moises Reusing, RN as [facility], [place], LCSW as [facility], [place], [place]   NAME OF PATIENT: Denise Watts  [id number]  [birth year]   DATE OF VISIT: 05/08/2024  REASON FOR CONSULT: SOPHIA SPERRY is a 69 y.o. 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   INTERVAL HISTORY: Patient was hospitalized 04/18/2024 to 04/20/2024 with significant bleeding and excoriation from her PEG.  General surgeon was consulted and Eliquis  was discontinued.  Revision of the PEG was not felt to be surgically feasible.  Removal was offered but patient declined.  Patient returns to [facility] today for follow-up.  She continues to have significant pain discomfort at PEG with reported drainage.  Patient was seen by IR earlier today with recommendations made for wound care.  Denies any neurologic complaints. Denies recent fevers or  illnesses. Denies chest pain. Denies any nausea, vomiting, constipation, or diarrhea. Denies urinary complaints. Patient offers no further specific complaints today.   PAST MEDICAL HISTORY: Past Medical History:  Diagnosis Date   Acute pulmonary embolism (HCC) 05/10/2023   Adenomatous colon polyp    Adrenal carcinoma, right (HCC) 12/28/2016   a.) high grade carcinoma with extensive necrosis --> s/p adrenalectomy   Anxiety    Aortic atherosclerosis    Arthritis of right knee    Atrial fibrillation and flutter (HCC)    a.) CHA2DS2-VASc = 3 (age, sex, vascular disease history) as of 02/13/2023; b.) cardiac rate/rhythm maintained intrinsically without pharmacological intervention; no chronic OAC (does take low dose ASA)   B12 deficiency    Breast cancer (HCC) 1997   a.) stage IB (cT1a, cN0, cM0, G3, ER+, PR-, HER2-); s/p lumpectomy + XRT   COPD (chronic obstructive pulmonary disease) (HCC)    Coronary artery disease    a.) cCTA 12/09/2021: Ca2+ = 135 (80th %'ile for age/sex/race match contol) --> 25-49% pLAD and RCA distributions   Depression    Diastolic dysfunction    a.) TTE 12/11/2020, no RWMAs, norm RVSF, G1DD; b.) TTE 12/14/2021: EF 55-60, no RWMAs, norm RVSF, G1DD; c.) TTE 01/27/2023: EF 50-55%, no RWMAs, G1DD, mild RAE, mild MR, mod TR   Dry cough 03/02/2017   Dyspnea    Full dentures    GERD (gastroesophageal reflux disease)    History of hiatal hernia    HLD (hyperlipidemia)    Invasive carcinoma of breast (HCC) 11/15/2017   a.) path (+) for G3, ER -, PR -, HER2/neu - ; s/p lumpectomy 01/17/2018 + 4 cycles TC systemic chemotherapy + XRT   Long term current use of aspirin     Personal history of chemotherapy  Personal history of radiation therapy    Pneumothorax of left lung after biopsy 01/25/2023   a.) hypoxic in PACU requiring re-intubation --> transferred to ICU --> chest tube placed by PCCM   RLS (restless legs syndrome)    a.) on ropinirole    Skin cancer     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   a.) Stage IB (cT1a, cN0, cM0, G3, ER+, PR-, HER2-)    PAST SURGICAL HISTORY:  Past Surgical History:  Procedure Laterality Date   BIOPSY  01/05/2023   Procedure: BIOPSY;  Surgeon: Unk Corinn Skiff, MD;  Location: [facility];  Service: Gastroenterology;;   BREAST BIOPSY Right 1997   positive   BREAST BIOPSY Right 11/15/2017   INVASIVE MAMMARY CARCINOMA triple negative   BREAST BIOPSY Right 07/20/2020   us  bx, Q marker, neg   BREAST LUMPECTOMY Right 1997   2019 also   BREAST LUMPECTOMY WITH SENTINEL LYMPH NODE BIOPSY Right 12/08/2017   Procedure: BREAST LUMPECTOMY WITH SENTINEL LYMPH NODE BX;  Surgeon: Dessa Reyes ORN, MD;  Location: [facility] ORS;  Service: General;  Laterality: Right;   BREAST REDUCTION WITH MASTOPEXY Left 06/10/2019   Procedure: Left breast mastopexy reduction for symmetry;  Surgeon: Lowery Estefana RAMAN, DO;  Location: [facility];  Service: Plastics;  Laterality: Left;  total 2.5 hours   CATARACT EXTRACTION W/PHACO Right 02/07/2022   Procedure: CATARACT EXTRACTION PHACO AND INTRAOCULAR LENS PLACEMENT (IOC) RIGHT DIABETIC;  Surgeon: Myrna Adine Anes, MD;  Location: [facility];  Service: Ophthalmology;  Laterality: Right;  3.60 00:34.7   CATARACT EXTRACTION W/PHACO Left 02/28/2022   Procedure: CATARACT EXTRACTION PHACO AND INTRAOCULAR LENS PLACEMENT (IOC) LEFT DIABETIC 4.11 00:26.1;  Surgeon: Myrna Adine Anes, MD;  Location: [facility];  Service: Ophthalmology;  Laterality: Left;   COLONOSCOPY  2015   COLONOSCOPY N/A 01/05/2023   Procedure: COLONOSCOPY;  Surgeon: Unk Corinn Skiff, MD;  Location[facility];  Service: Gastroenterology;  Laterality: N/A;   COLONOSCOPY WITH PROPOFOL  N/A 10/29/2019   Procedure: COLONOSCOPY WITH PROPOFOL ;  Surgeon: Jinny Carmine, MD;  Location: [facility];  Service: Endoscopy;  Laterality: N/A;   ESOPHAGOGASTRODUODENOSCOPY (EGD) WITH PROPOFOL   N/A 05/24/2019   Procedure: ESOPHAGOGASTRODUODENOSCOPY (EGD) WITH PROPOFOL ;  Surgeon: Jinny Carmine, MD;  Location: [facility];  Service: Endoscopy;  Laterality: N/A;   ESOPHAGOGASTRODUODENOSCOPY (EGD) WITH PROPOFOL  N/A 09/06/2022   Procedure: ESOPHAGOGASTRODUODENOSCOPY (EGD) WITH PROPOFOL ;  Surgeon: Jinny Carmine, MD;  Location: [facility];  Service: Endoscopy;  Laterality: N/A;   ESOPHAGOGASTRODUODENOSCOPY (EGD) WITH PROPOFOL   01/05/2023   Procedure: ESOPHAGOGASTRODUODENOSCOPY (EGD) WITH PROPOFOL ;  Surgeon: Unk Corinn Skiff, MD;  Location: [facility];  Service: Gastroenterology;;   ESOPHAGUS SURGERY     GASTROSTOMY N/A 05/25/2023   Procedure: OPEN INSERTION OF GASTROSTOMY TUBE;  Surgeon: Jordis Laneta FALCON, MD;  Location: [facility];  Service: General;  Laterality: N/A;   IR IMAGING GUIDED PORT INSERTION  02/07/2023   IR REPLACE G-TUBE SIMPLE WO FLUORO  02/27/2024   IR REPLC GASTRO/COLONIC TUBE PERCUT W/FLUORO  09/12/2023   IR REPLC GASTRO/COLONIC TUBE PERCUT W/FLUORO  04/19/2024   KNEE SURGERY Right    LIPOSUCTION WITH LIPOFILLING Right 06/10/2019   Procedure: right breast scar and capsule release with fat grafting;  Surgeon: Lowery Estefana RAMAN, DO;  Location: [facility];  Service: Plastics;  Laterality: Right;   POLYPECTOMY  01/05/2023   Procedure: POLYPECTOMY;  Surgeon: Unk Corinn Skiff, MD;  Location: [facility];  Service: Gastroenterology;;   [facility] REMOVAL  PORTACATH PLACEMENT Right 01/17/2018   Procedure: INSERTION PORT-A-CATH- RIGHT;  Surgeon: Dessa Reyes ORN, MD;  Location: [facility];  Service: General;  Laterality: Right;   PULMONARY THROMBECTOMY Bilateral 05/12/2023   Procedure: PULMONARY THROMBECTOMY;  Surgeon: Marea Selinda RAMAN, MD;  Location: [facility];  Service: Cardiovascular;  Laterality: Bilateral;   ROBOTIC ADRENALECTOMY Right 12/28/2016   Procedure: ROBOTIC ADRENALECTOMY;  Surgeon: Penne Knee, MD;  Location: [facility];  Service:  Urology;  Laterality: Right;   SKIN CANCER EXCISION     THROAT SURGERY  1998   throat cancer    TUBAL LIGATION     VENTRAL HERNIA REPAIR N/A 03/03/2017   Procedure: HERNIA REPAIR VENTRAL ADULT;  Surgeon: Shelva Dunnings, MD;  Location: [facility];  Service: General;  Laterality: N/A;   VIDEO BRONCHOSCOPY WITH ENDOBRONCHIAL ULTRASOUND N/A 01/25/2023   Procedure: VIDEO BRONCHOSCOPY WITH ENDOBRONCHIAL ULTRASOUND;  Surgeon: Parris Manna, MD;  Location: [facility];  Service: Thoracic;  Laterality: N/A;   VIDEO BRONCHOSCOPY WITH ENDOBRONCHIAL ULTRASOUND N/A 02/17/2023   Procedure: VIDEO BRONCHOSCOPY WITH ENDOBRONCHIAL ULTRASOUND;  Surgeon: Parris Manna, MD;  Location: [facility];  Service: Thoracic;  Laterality: N/A;    HEMATOLOGY/ONCOLOGY HISTORY:  Oncology History Overview Note  1. Carcinoma of breast, T1, N0, M0 tumor diagnosis.  In January of 1997 2. Carcinoma of the vocal cord T1, N0, M0 tumor.  Status postradiation therapy 3. Abnormal CT scan of the chest (December, 2015) 4. Repeat CT scan of chest shows stable nodule (July, 2016) 5. Neutropenia with normal hemoglobin and normal platelet count (July, 2016)   Cancer of vocal cord [facility]) (Resolved)  11/10/2014 Initial Diagnosis   Cancer of vocal cord   Malignant neoplasm of lower-outer quadrant of right breast of female, estrogen receptor negative (HCC)  11/24/2017 Initial Diagnosis   Malignant neoplasm of lower-outer quadrant of right breast of female, estrogen receptor negative (HCC)   01/12/2018 Cancer Staging   Staging form: Breast, AJCC 8th Edition - Clinical stage from 01/12/2018: Stage IB (cT1a, cN0, cM0, G3, ER+, PR-, HER2-) - Signed by Melanee Annah BROCKS, MD on 01/12/2018   01/30/2018 - 04/26/2018 Chemotherapy   The patient had dexamethasone  (DECADRON ) 4 MG tablet, 8 mg, Oral, 2 times daily, 1 of 1 cycle, Start date: 01/12/2018, End date: 08/03/2018 palonosetron  (ALOXI ) injection 0.25 mg, 0.25 mg, Intravenous,  Once, 4 of 4  cycles Administration: 0.25 mg (01/30/2018), 0.25 mg (02/27/2018), 0.25 mg (03/28/2018), 0.25 mg (04/26/2018) pegfilgrastim  (NEULASTA  ONPRO KIT) injection 6 mg, 6 mg, Subcutaneous, Once, 4 of 4 cycles Administration: 6 mg (01/30/2018), 6 mg (02/27/2018), 6 mg (03/28/2018), 6 mg (04/26/2018) cyclophosphamide  (CYTOXAN ) 1,000 mg in sodium chloride  0.9 % 250 mL chemo infusion, 600 mg/m2 = 1,000 mg, Intravenous,  Once, 4 of 4 cycles Administration: 1,000 mg (01/30/2018), 1,000 mg (02/27/2018), 1,000 mg (03/28/2018), 1,000 mg (04/26/2018) DOCEtaxel  (TAXOTERE ) 130 mg in sodium chloride  0.9 % 250 mL chemo infusion, 75 mg/m2 = 130 mg, Intravenous,  Once, 4 of 4 cycles Dose modification: 60 mg/m2 (original dose 75 mg/m2, Cycle 2, Reason: Dose not tolerated) Administration: 130 mg (01/30/2018), 100 mg (02/27/2018), 100 mg (03/28/2018), 100 mg (04/26/2018)  for chemotherapy treatment.    Non-small cell lung cancer (HCC)  03/09/2023 Initial Diagnosis   Non-small cell lung cancer (HCC)   03/20/2023 -  Chemotherapy   Patient is on Treatment Plan : LUNG Carboplatin  + Paclitaxel  q7d     Primary malignant neoplasm of right lung metastatic to other site [facility])  01/22/2024 Initial Diagnosis  Primary malignant neoplasm of right lung metastatic to other site [facility])   01/22/2024 Cancer Staging   Staging form: Lung, AJCC V9 - Clinical stage from 01/22/2024: rcT4, rcN3, rpM1 - Signed by Melanee Annah BROCKS, MD on 01/22/2024 Stage prefix: Recurrence     ALLERGIES:  is allergic to sulfa  antibiotics.  MEDICATIONS:  Current Outpatient Medications  Medication Sig Dispense Refill   albuterol  (VENTOLIN  HFA) 108 (90 Base) MCG/ACT inhaler Inhale 2 puffs into the lungs every 6 (six) hours as needed for wheezing or shortness of breath. 51 each 2   bisacodyl  (DULCOLAX) 10 MG suppository Place 1 suppository (10 mg total) rectally daily as needed for moderate constipation or severe constipation.     busPIRone  (BUSPAR ) 5 MG tablet  Take 1 tablet (5 mg total) by mouth 3 (three) times daily as needed. 270 tablet 2   DULoxetine  (CYMBALTA ) 60 MG capsule TAKE 1 CAPSULE BY MOUTH DAILY 90 capsule 0   fentaNYL  (DURAGESIC ) 12 MCG/HR Place 1 patch onto the skin every 3 (three) days. 10 patch 0   Fluticasone -Umeclidin-Vilant (TRELEGY ELLIPTA ) 100-62.5-25 MCG/ACT AEPB Inhale 1 Act into the lungs daily. 1 each 5   GILOTRIF  30 MG tablet TAKE 1 TABLET DAILY ON AN EMPTY STOMACH AT LEAST 1 HOUR BEFORE OR 2 HOURS AFTER MEALS 30 tablet 1   ipratropium-albuterol  (DUONEB) 0.5-2.5 (3) MG/3ML SOLN Take 3 mLs by nebulization every 4 (four) hours as needed.     lactulose , encephalopathy, (CHRONULAC ) 10 GM/15ML SOLN Place 30 mLs (20 g total) into feeding tube 2 (two) times daily. 946 mL 3   lidocaine  (LIDODERM ) 5 % Place 1 patch onto the skin daily. Remove & Discard patch within 12 hours or as directed by MD 30 patch 2   meloxicam  (MOBIC ) 7.5 MG tablet Take 1 tablet (7.5 mg total) by mouth daily. 90 tablet 1   montelukast  (SINGULAIR ) 10 MG tablet TAKE 1 TABLET BY MOUTH AT BEDTIME 90 tablet 0   Nutritional Supplements (NUTREN 1.5) LIQD Give 1 carton 4 times a day (8am, noon, 4pm and 8pm). Flush with 60ml of water  before and after each feeding.  Give additional 1 1/2 cups of water  a day via tube or drink orally for adequate hydration.     ondansetron  (ZOFRAN -ODT) 4 MG disintegrating tablet Take 1 tablet (4 mg total) by mouth every 8 (eight) hours as needed for nausea or vomiting. 20 tablet 0   oxyCODONE  (ROXICODONE ) 5 MG/5ML solution Take 5 mLs (5 mg total) by mouth every 8 (eight) hours as needed for severe pain (pain score 7-10). 200 mL 0   pregabalin (LYRICA) 25 MG capsule Take 1 capsule (25 mg total) by mouth 2 (two) times daily. 60 capsule 0   rOPINIRole  (REQUIP ) 0.5 MG tablet Take 1 tablet (0.5 mg total) by mouth 3 (three) times daily. 90 tablet 1   rosuvastatin  (CRESTOR ) 40 MG tablet PLACE 1 TABLET (40 MG TOTAL) INTO FEEDING TUBE DAILY 90 tablet 0    senna (SENOKOT) 8.6 MG TABS tablet Place 1 tablet (8.6 mg total) into feeding tube daily.     sucralfate  (CARAFATE ) 1 GM/10ML suspension Place 10 mLs (1 g total) into feeding tube 4 (four) times daily. 1200 mL 2   tolterodine  (DETROL  LA) 4 MG 24 hr capsule Take 4 mg by mouth daily.     Water  For Irrigation, Sterile (FREE WATER ) SOLN Place 60 mLs into feeding tube 5 (five) times daily.     benzonatate  (TESSALON ) 100 MG capsule Take 2 capsules (  200 mg total) by mouth 3 (three) times daily as needed for cough. (Patient not taking: Reported on 05/08/2024)     cyclobenzaprine  (FLEXERIL ) 5 MG tablet Take 5 mg by mouth 3 (three) times daily as needed for muscle spasms. (Patient not taking: Reported on 05/08/2024)     omeprazole  (KONVOMEP ) 2 mg/mL SUSP oral suspension Take 20 mLs (40 mg total) by mouth daily. (Patient not taking: Reported on 05/08/2024) 240 mL 1   No current facility-administered medications for this visit.   Facility-Administered Medications Ordered in Other Visits  Medication Dose Route Frequency Provider Last Rate Last Admin   cyanocobalamin  (VITAMIN B12) injection 1,000 mcg  1,000 mcg Intramuscular Q30 days Rao, Archana C, MD   1,000 mcg at 03/16/22 1101    VITAL SIGNS: BP (!) 107/56 (BP Location: Left Arm, Patient Position: Sitting, Cuff Size: Small)   Pulse 83   Temp 97.6 F (36.4 C) (Tympanic)   Wt 101 lb (45.8 kg)   SpO2 97%   BMI 19.08 kg/m  Filed Weights   05/08/24 1335  Weight: 101 lb (45.8 kg)    Estimated body mass index is 19.08 kg/m as calculated from the following:   Height as of 04/18/24: 5' 1 (1.549 m).   Weight as of this encounter: 101 lb (45.8 kg).  LABS: CBC:    Component Value Date/Time   WBC 8.7 04/20/2024 0357   HGB 11.5 (L) 04/20/2024 0357   HGB 12.3 04/16/2024 0850   HGB 14.0 11/03/2021 1551   HCT 36.4 04/20/2024 0357   HCT 42.1 11/03/2021 1551   PLT 159 04/20/2024 0357   PLT 198 04/16/2024 0850   PLT 216 11/03/2021 1551   MCV 99.7  04/20/2024 0357   MCV 97 11/03/2021 1551   MCV 100 04/08/2014 0409   NEUTROABS 6.7 04/20/2024 0357   NEUTROABS 3.5 11/03/2021 1551   NEUTROABS 6.1 04/08/2014 0409   LYMPHSABS 0.9 04/20/2024 0357   LYMPHSABS 1.2 11/03/2021 1551   LYMPHSABS 0.8 (L) 04/08/2014 0409   MONOABS 1.0 04/20/2024 0357   MONOABS 0.7 04/08/2014 0409   EOSABS 0.2 04/20/2024 0357   EOSABS 0.2 11/03/2021 1551   EOSABS 0.0 04/08/2014 0409   BASOSABS 0.0 04/20/2024 0357   BASOSABS 0.0 11/03/2021 1551   BASOSABS 0.0 04/08/2014 0409   Comprehensive Metabolic Panel:    Component Value Date/Time   NA 137 04/20/2024 0357   NA 145 (H) 11/03/2021 1551   NA 143 04/08/2014 0409   K 4.3 04/20/2024 0357   K 3.5 04/08/2014 0409   CL 102 04/20/2024 0357   CL 113 (H) 04/08/2014 0409   CO2 26 04/20/2024 0357   CO2 26 04/08/2014 0409   BUN 12 04/20/2024 0357   BUN 13 11/03/2021 1551   BUN 6 (L) 04/08/2014 0409   CREATININE 0.68 04/20/2024 0357   CREATININE 0.74 04/16/2024 0850   CREATININE 0.67 04/08/2014 0409   GLUCOSE 122 (H) 04/20/2024 0357   GLUCOSE 84 04/08/2014 0409   CALCIUM  8.8 (L) 04/20/2024 0357   CALCIUM  7.5 (L) 04/08/2014 0409   AST 45 (H) 04/16/2024 0850   ALT 42 04/16/2024 0850   ALT 21 11/26/2012 1019   ALKPHOS 84 04/16/2024 0850   ALKPHOS 87 11/26/2012 1019   BILITOT 0.4 04/16/2024 0850   PROT 5.9 (L) 04/16/2024 0850   PROT 6.3 11/03/2021 1551   PROT 6.4 11/26/2012 1019   ALBUMIN  3.6 04/16/2024 0850   ALBUMIN  4.1 11/03/2021 1551   ALBUMIN  3.5 11/26/2012 1019  RADIOGRAPHIC STUDIES: IR Replc Gastro/Colonic Tube Percut W/Fluoro Result Date: 04/19/2024 INDICATION: Indwelling gastrostomy tube (18 French) previously exchanged by IR now with episodic bleeding at the ostomy site as well as leakage of gastric contents onto the patient's skin. EXAM: Up sizing of gastrostomy tube under fluoroscopy ANESTHESIA/SEDATION: Moderate (conscious) sedation was NOT employed during this procedure. A total of  Fentanyl  50 mcg was administered intravenously by the radiology nurse for pain control. CONTRAST:  8 mL Omnipaque  300-administered into the gastric lumen. FLUOROSCOPY: Radiation Exposure Index (as provided by the fluoroscopic device): 1 mGy Kerma COMPLICATIONS: None immediate. PROCEDURE: Informed written consent was obtained from the patient after a thorough discussion of the procedural risks, benefits and alternatives. All questions were addressed. Maximal Sterile Barrier Technique was utilized including caps, mask, sterile gowns, sterile gloves, sterile drape, hand hygiene and skin antiseptic. A timeout was performed prior to the initiation of the procedure. In a supine position, the patient's epigastric region was prepped and draped in usual sterile fashion. Dilute contrast was injected through the indwelling gastrostomy tube demonstrating filling of the stomach and easy flowing of contrast through the tube and into the stomach lumen. No contrast extravasation. The retention balloon was deflated. A short Amplatz wire was advanced through the gastrostomy tube and coiled in the stomach. The gastrostomy tube was then removed from the patient. A new larger (20 French) gastrostomy tube was then advanced over the wire and placed in the stomach. Retention balloon was then filled with 10 mL normal saline. Gastrostomy tube was then partially retracted in the [facility] disc was closely applied to the patient's skin. Silver  nitrate was then liberally applied at the ostomy to cauterize any neovascularity that may be causing intermittent bleeding at the ostomy site. Sterile dressing was applied. IMPRESSION: Satisfactory up sizing of the gastrostomy tube from 62 French to 20 French. Successful chemical cautery of the neovascularity at the ostomy site using silver  nitrate. Electronically Signed   By: Cordella Banner   On: 04/19/2024 16:26   CT ABDOMEN PELVIS W CONTRAST Result Date: 04/18/2024 CLINICAL DATA:  Abdominal pain  and bleeding around gastrostomy tube site. Gastrostomy tube exchange 02/27/2024. EXAM: CT ABDOMEN AND PELVIS WITH CONTRAST TECHNIQUE: Multidetector CT imaging of the abdomen and pelvis was performed using the standard protocol following bolus administration of intravenous contrast. RADIATION DOSE REDUCTION: This exam was performed according to the departmental dose-optimization program which includes automated exposure control, adjustment of the mA and/or kV according to patient size and/or use of iterative reconstruction technique. CONTRAST:  75mL OMNIPAQUE  IOHEXOL  300 MG/ML  SOLN COMPARISON:  12/13/2023, 05/26/2018 FINDINGS: Lower chest: Heart is normal size. Chronic changes over the right breast. Visualized lung bases demonstrate no acute process. Interval improvement of previously seen multiple small bilateral nodules over the lung bases in this patient with known metastatic lung cancer. Calcified plaque over the descending thoracic aorta. Hepatobiliary: Liver, gallbladder and biliary tree are normal. Pancreas: Normal. Spleen: Normal. Adrenals/Urinary Tract: Left adrenal gland is normal. Right adrenal gland is not well visualized. Kidneys are normal in size without hydronephrosis or nephrolithiasis. Couple small left renal cysts unchanged. Ureters and bladder are normal. Stomach/Bowel: Evidence of patient's percutaneous gastrostomy tube as the balloon portion of the tube is not well-defined as likely some misshapen containing small amount of air. Tube is in otherwise adequate position. No concerning fluid collection along the tube tract. Small bowel is within normal. Appendix not visualized. Mild diverticulosis of the colon without active inflammation. Vascular/Lymphatic: Moderate calcified plaque over the  abdominal aorta which is normal caliber. No adenopathy. Reproductive: Uterus and bilateral adnexa are unremarkable. Other: No significant free fluid or focal inflammatory change. Musculoskeletal: No focal  abnormality. IMPRESSION: 1. No acute findings in the abdomen/pelvis. 2. Evidence of patient's percutaneous gastrostomy tube as the balloon portion of the tube is not well-defined and is likely misshapen containing small amount of air. Tube is in otherwise adequate position. No concerning fluid collection along the tube tract. 3. Mild colonic diverticulosis without active inflammation. 4. Couple small left renal cysts unchanged. 5. Interval improvement of previously seen multiple small bilateral nodules over the lung bases in this patient with known metastatic lung cancer. 6. Aortic atherosclerosis. Aortic Atherosclerosis (ICD10-I70.0). Electronically Signed   By: Toribio Agreste M.D.   On: 04/18/2024 15:20    PERFORMANCE STATUS (ECOG) : 2 - Symptomatic, <50% confined to bed  Review of Systems Unless otherwise noted, a complete review of systems is negative.  Physical Exam General: NAD Cardiovascular: regular rate and rhythm Pulmonary: clear ant fields Abdomen: soft, nontender, + bowel sounds GU: no suprapubic tenderness Extremities: no edema, no joint deformities Skin: no rashes Neurological: Weakness but otherwise nonfocal  IMPRESSION/PLAN: Lung cancer -on chemotherapy.  Patient states her goals are aligned with continued treatment as long as any's are available.  She cites having a recent great-grandchild to live as long as possible to be with family.  Patient understands long-term prognosis is poor.  PEG drainage/excoriation -site was dressed by IR PA who made recommendations for zinc  oxide barrier cream, gauze, and applying bumper against skin to reduce drainage.  Recommendation was also made for wound center, which our office will coordinate.  Patient states that she does want to leave PEG in place.  Of note, patient was seen by SLP earlier today and was found to have silent aspiration with modified barium swallow study.  Neoplasm related pain -refill oxycodone   MD follow-up in 2  weeks  Patient expressed understanding and was in agreement with this plan. She also understands that She can call clinic at any time with any questions, concerns, or complaints.   Thank you for allowing me to participate in the care of this very pleasant patient.   Time Total: 20 minutes  Visit consisted of counseling and education dealing with the complex and emotionally intense issues of symptom management in the setting of serious illness.Greater than 50%  of this time was spent counseling and coordinating care related to the above assessment and plan.  Signed by: Fonda Mower, PhD, NP-C     "

## 2024-05-08 NOTE — Procedures (Signed)
 Modified Barium Swallow Study  Patient Details  Name: Denise Watts MRN: [id number] Date of Birth: Aug 23, 1955  Today's Date: 05/08/2024  Modified Barium Swallow completed.  Full report located under Chart Review in the Imaging Section.  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       While pt had a Modified Barium Swallow Study on 02/08/2021 with follow up recommended, it appears that pt has been lost of services. Results of this MBSS were puree with nectar thick liquids d/t moderate to severe pharyngoesophageal dysphagia and aspiration.       Per chart:  05/22/2023  Patient has had difficulty swallowing pills and so could not take Eliquis  and was therefore started on Lovenox .  Patient has also had difficulty maintaining adequate nutrition due to swallowing and has had significant weight loss.  Patient is interested in pursuing PEG placement to allow her to complete cancer treatment.    05/24/2023   Pt admitted for PEG placement, chart indicates multiple admissions for pain/management of PEG       05/06/2024  ST consult received from [facility] as pt called stating she would like the feeding tube to be removed asap. I am tired of having it and I want it out asap next week.    Clinical Impression Pt arrived to fluro suite reporting 10 out of 10 pain at PEG insertion site with her clothing observed to be wet (her sweatshirt). Pt with difficulty transferring to fluro chart d/t this pain. She states pain around PEG and leakage of formula around PEG insertion as desire to have PEG removed. She appears distracted by pain. She states that I probably need the PEG  because I don't think my swallowing is that great, I can't take the pain anymore.     She reports consuming mostly mashed potatoes and thin liquids.   PEG site was observed to be red, ozzing with saturated gauze surrounding it. PEG site was re-dressed by radiology PA with this writer taking notes on dressing recommendations as well as consult requested by PA to wound center for the patient This information was also provided personally by this clinical research associate and via secure chat to [employer]. Pt voiced understanding of instructions and recommendations.       As far as this swallow study, pt presents with what appears to be chronic to slightly worsened pharyngoesophageal dysphagia when consuming thin liquids and nectar thick liquids via spoon. Pt's pharyngoesophageal deficits include:  reduced base of tongue, hyolaryngeal movement, reduced epiglottic inversion, reduced/weak pharyngeal parastalsis and reduced passage of bolus thru UES. This results in moderate to severe widespread pharyngeal residue that was silently aspirated after the swallow with both thin liquids and nectar thick liquids.   Given pt's high risk of aspiration, would recommend continued nutrition and hydration through PEG. Education provided to pt and will plan to see pt as an Outpatient next to go over recommendations.   Factors that may increase risk of adverse event in presence of aspiration Noe [employer] Lianne 2021): Poor general health and/or compromised immunity;Frail or deconditioned;Weak cough;Aspiration of thick, dense, and/or acidic materials  Swallow Evaluation Recommendations Recommendations: Free water  protocol after oral care;Alternative means of nutrition - G Tube  Liquid Administration via: Spoon Medication Administration: Via alternative means Oral care recommendations: Oral care before ice chips/water       [name] 05/08/2024,3:47 PM

## 2024-05-10 ENCOUNTER — Other Ambulatory Visit: Payer: Self-pay

## 2024-05-10 ENCOUNTER — Inpatient Hospital Stay

## 2024-05-10 ENCOUNTER — Other Ambulatory Visit: Payer: Self-pay | Admitting: Family Medicine

## 2024-05-10 DIAGNOSIS — E878 Other disorders of electrolyte and fluid balance, not elsewhere classified: Secondary | ICD-10-CM

## 2024-05-10 DIAGNOSIS — C3491 Malignant neoplasm of unspecified part of right bronchus or lung: Secondary | ICD-10-CM | POA: Diagnosis not present

## 2024-05-10 LAB — CBC WITH DIFFERENTIAL (CANCER CENTER ONLY)
Abs Immature Granulocytes: 0.03 K/uL (ref 0.00–0.07)
Basophils Absolute: 0 K/uL (ref 0.0–0.1)
Basophils Relative: 1 %
Eosinophils Absolute: 0.3 K/uL (ref 0.0–0.5)
Eosinophils Relative: 4 %
HCT: 36 % (ref 36.0–46.0)
Hemoglobin: 11 g/dL — ABNORMAL LOW (ref 12.0–15.0)
Immature Granulocytes: 1 %
Lymphocytes Relative: 14 %
Lymphs Abs: 0.9 K/uL (ref 0.7–4.0)
MCH: 30.7 pg (ref 26.0–34.0)
MCHC: 30.6 g/dL (ref 30.0–36.0)
MCV: 100.6 fL — ABNORMAL HIGH (ref 80.0–100.0)
Monocytes Absolute: 0.6 K/uL (ref 0.1–1.0)
Monocytes Relative: 9 %
Neutro Abs: 4.8 K/uL (ref 1.7–7.7)
Neutrophils Relative %: 71 %
Platelet Count: 312 K/uL (ref 150–400)
RBC: 3.58 MIL/uL — ABNORMAL LOW (ref 3.87–5.11)
RDW: 12.7 % (ref 11.5–15.5)
WBC Count: 6.6 K/uL (ref 4.0–10.5)
nRBC: 0 % (ref 0.0–0.2)

## 2024-05-10 LAB — COMPREHENSIVE METABOLIC PANEL WITH GFR
ALT: 8 U/L (ref 0–44)
AST: 26 U/L (ref 15–41)
Albumin: 3.5 g/dL (ref 3.5–5.0)
Alkaline Phosphatase: 80 U/L (ref 38–126)
Anion gap: 12 (ref 5–15)
BUN: 20 mg/dL (ref 8–23)
CO2: 26 mmol/L (ref 22–32)
Calcium: 9.9 mg/dL (ref 8.9–10.3)
Chloride: 104 mmol/L (ref 98–111)
Creatinine, Ser: 0.84 mg/dL (ref 0.44–1.00)
GFR, Estimated: >60 mL/min
Glucose, Bld: 87 mg/dL (ref 70–99)
Potassium: 3.5 mmol/L (ref 3.5–5.1)
Sodium: 142 mmol/L (ref 135–145)
Total Bilirubin: 0.3 mg/dL (ref 0.0–1.2)
Total Protein: 6.2 g/dL — ABNORMAL LOW (ref 6.5–8.1)

## 2024-05-10 LAB — MAGNESIUM: Magnesium: 2.1 mg/dL (ref 1.7–2.4)

## 2024-05-10 LAB — PHOSPHORUS: Phosphorus: 2.8 mg/dL (ref 2.5–4.6)

## 2024-05-10 NOTE — Progress Notes (Signed)
 Nutrition Follow-up:  Patient with metastatic non-small cell lung cancer with bone and lymph node metastasis.  Followed by Dr Melanee, PEG placed 05/25/23. Tube exchanged in 01/2024. Recent hospital admission, seen by surgery. Currently receiving afatinib  and zometa    Patient arrived ~20 minutes after scheduled appointment time.    Reports that she had some nausea right before coming to clinic this am.   Has not taken nausea medication. Said that she did 1 carton of nutren 1.5 via feeding tube yesterday and 1 carton this am.  Says that she ate dinner last night.  Flushed with 30 ml of water  before and after the feeding this morning.  Gave medications via tube just prior to feeding.  Says she feels full.   Reports that she had a bowel movement this am, not constipated.  Says that she tried the juven yesterday.    Reports that her pain around the tube is better today.     Medications: reviewed  Labs: K, phosphorus and Magnesium  normal today  Anthropometrics:   Weight 101 lb 1/7 106 lb 01/26/24 106 lb 4 oz on 7/25 107 lb 2 oz on 4/25 109 lb on 3/26 111 lb on 2/21 117 lb on 2/3 119 lb 4.3 oz on 05/30/23   Estimated Energy Needs  Kcals: 1500-1750 Protein: 50-60 g Fluid: 1500-1750 ml  NUTRITION DIAGNOSIS: Inadequate oral intake   INTERVENTION:  Recommend patient separate giving medication via tube from tube feeding by at least 1 hour to decrease feeling of fullness.  Also discussed continuous pump feeding as option and bolee bag option of feeding vs via bolus syringe.  She wants to continue with bolus syringe feeding at this time. New tube feeding regimen provided in writing to patient today.  Encouraged patient to give 2-3 cartons of formula today and add 1 carton daily until she reaches 4 cartons total/day.  Flush with 60ml of water  before and after each feeding.  Continue juven BID (diluted in water ) for wound healing and given via tube.      MONITORING, EVALUATION, GOAL: weight  trends, tube feeding   NEXT VISIT: Wednesday, Jan 14 in clinic  Denise Watts SOLON, CSO, LDN Registered Dietitian [phone]

## 2024-05-13 ENCOUNTER — Ambulatory Visit: Admitting: Surgery

## 2024-05-13 ENCOUNTER — Encounter: Payer: Self-pay | Admitting: Surgery

## 2024-05-13 ENCOUNTER — Encounter: Payer: Self-pay | Admitting: Oncology

## 2024-05-13 VITALS — BP 106/70 | HR 91 | Ht 61.0 in | Wt 101.0 lb

## 2024-05-13 DIAGNOSIS — Z431 Encounter for attention to gastrostomy: Secondary | ICD-10-CM | POA: Diagnosis not present

## 2024-05-13 DIAGNOSIS — C3490 Malignant neoplasm of unspecified part of unspecified bronchus or lung: Secondary | ICD-10-CM | POA: Diagnosis not present

## 2024-05-13 NOTE — Patient Instructions (Signed)
Please call the office if you have any questions or concerns. 

## 2024-05-13 NOTE — Telephone Encounter (Signed)
 Requested Prescriptions  Pending Prescriptions Disp Refills   sucralfate  (CARAFATE ) 1 GM/10ML suspension [Pharmacy Med Name: SUCRALFATE  1 GM/10ML SUSP ML] 1200 mL 2    Sig: PLACE 10 ML (1 GRAM TOTAL) INTO FEEDING TUBE 4 TIMES DAILY     Gastroenterology: Antiacids Passed - 05/13/2024 12:23 PM      Passed - Valid encounter within last 12 months    Recent Outpatient Visits           1 month ago Viral upper respiratory tract infection with cough   [place] [facility] Simmons-Robinson, [place], MD   9 months ago Chronic cough   [facility] Gully, Lauraine SAILOR, DO   10 months ago Mucopurulent chronic bronchitis [facility])   [place] [facility] Pardue, Sarah N, DO   10 months ago Protein-calorie malnutrition, moderate   [facility] Dasher, [place], MD

## 2024-05-13 NOTE — Progress Notes (Unsigned)
 Outpatient Surgical Follow Up  05/13/2024  Denise Watts is an 69 y.o. female.   Chief Complaint  Patient presents with   Follow-up    HPI: Denise Watts is a 69 year old female well-known to me with a history ofMetastatic lung CA, malnutrition , she had multiple malignancies in the past now presenting with stage IV non-small cell lung cancer with bone and lymph node metastases . Recent exchange of G tube by IR and bleeding. Excoration around the G-tube site improving.  No fevers no chills. She does have chronic pain, dull,  moderate , abdominal and multiple sites and oncology is following . Recent swallow study pers reviewed showing aspiration, fluoroscopic images reviewed showing appropiate position of the g tube   Past Medical History:  Diagnosis Date   Acute pulmonary embolism (HCC) 05/10/2023   Adenomatous colon polyp    Adrenal carcinoma, right (HCC) 12/28/2016   a.) high grade carcinoma with extensive necrosis --> s/p adrenalectomy   Anxiety    Aortic atherosclerosis    Arthritis of right knee    Atrial fibrillation and flutter (HCC)    a.) CHA2DS2-VASc = 3 (age, sex, vascular disease history) as of 02/13/2023; b.) cardiac rate/rhythm maintained intrinsically without pharmacological intervention; no chronic OAC (does take low dose ASA)   B12 deficiency    Breast cancer (HCC) 1997   a.) stage IB (cT1a, cN0, cM0, G3, ER+, PR-, HER2-); s/p lumpectomy + XRT   COPD (chronic obstructive pulmonary disease) (HCC)    Coronary artery disease    a.) cCTA 12/09/2021: Ca2+ = 135 (80th %'ile for age/sex/race match contol) --> 25-49% pLAD and RCA distributions   Depression    Diastolic dysfunction    a.) TTE 12/11/2020, no RWMAs, norm RVSF, G1DD; b.) TTE 12/14/2021: EF 55-60, no RWMAs, norm RVSF, G1DD; c.) TTE 01/27/2023: EF 50-55%, no RWMAs, G1DD, mild RAE, mild MR, mod TR   Dry cough 03/02/2017   Dyspnea    Full dentures    GERD (gastroesophageal reflux disease)    History of hiatal hernia     HLD (hyperlipidemia)    Invasive carcinoma of breast (HCC) 11/15/2017   a.) path (+) for G3, ER -, PR -, HER2/neu - ; s/p lumpectomy 01/17/2018 + 4 cycles TC systemic chemotherapy + XRT   Long term current use of aspirin     Personal history of chemotherapy    Personal history of radiation therapy    Pneumothorax of left lung after biopsy 01/25/2023   a.) hypoxic in PACU requiring re-intubation --> transferred to ICU --> chest tube placed by PCCM   RLS (restless legs syndrome)    a.) on ropinirole    Skin cancer    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   a.) Stage IB (cT1a, cN0, cM0, G3, ER+, PR-, HER2-)    Past Surgical History:  Procedure Laterality Date   BIOPSY  01/05/2023   Procedure: BIOPSY;  Surgeon: Unk Corinn Skiff, MD;  Location: [facility];  Service: Gastroenterology;;   BREAST BIOPSY Right 1997   positive   BREAST BIOPSY Right 11/15/2017   INVASIVE MAMMARY CARCINOMA triple negative   BREAST BIOPSY Right 07/20/2020   us  bx, Q marker, neg   BREAST LUMPECTOMY Right 1997   2019 also   BREAST LUMPECTOMY WITH SENTINEL LYMPH NODE BIOPSY Right 12/08/2017   Procedure: BREAST LUMPECTOMY WITH SENTINEL LYMPH NODE BX;  Surgeon: Dessa Reyes ORN, MD;  Location: [facility];  Service: General;  Laterality: Right;   BREAST REDUCTION WITH MASTOPEXY  Left 06/10/2019   Procedure: Left breast mastopexy reduction for symmetry;  Surgeon: Lowery Estefana RAMAN, DO;  Location: [facility];  Service: Plastics;  Laterality: Left;  total 2.5 hours   CATARACT EXTRACTION W/PHACO Right 02/07/2022   Procedure: CATARACT EXTRACTION PHACO AND INTRAOCULAR LENS PLACEMENT (IOC) RIGHT DIABETIC;  Surgeon: Myrna Adine Anes, MD;  Location: [facility];  Service: Ophthalmology;  Laterality: Right;  3.60 00:34.7   CATARACT EXTRACTION W/PHACO Left 02/28/2022   Procedure: CATARACT EXTRACTION PHACO AND INTRAOCULAR LENS PLACEMENT (IOC) LEFT DIABETIC 4.11 00:26.1;  Surgeon: Myrna Adine Anes, MD;  Location: [facility];  Service: Ophthalmology;  Laterality: Left;   COLONOSCOPY  2015   COLONOSCOPY N/A 01/05/2023   Procedure: COLONOSCOPY;  Surgeon: Unk Corinn Skiff, MD;  Location: [facility];  Service: Gastroenterology;  Laterality: N/A;   COLONOSCOPY WITH PROPOFOL  N/A 10/29/2019   Procedure: COLONOSCOPY WITH PROPOFOL ;  Surgeon: Jinny Carmine, MD;  Location: [facility];  Service: Endoscopy;  Laterality: N/A;   ESOPHAGOGASTRODUODENOSCOPY (EGD) WITH PROPOFOL  N/A 05/24/2019   Procedure: ESOPHAGOGASTRODUODENOSCOPY (EGD) WITH PROPOFOL ;  Surgeon: Jinny Carmine, MD;  Location: [facility];  Service: Endoscopy;  Laterality: N/A;   ESOPHAGOGASTRODUODENOSCOPY (EGD) WITH PROPOFOL  N/A 09/06/2022   Procedure: ESOPHAGOGASTRODUODENOSCOPY (EGD) WITH PROPOFOL ;  Surgeon: Jinny Carmine, MD;  Location: [facility];  Service: Endoscopy;  Laterality: N/A;   ESOPHAGOGASTRODUODENOSCOPY (EGD) WITH PROPOFOL   01/05/2023   Procedure: ESOPHAGOGASTRODUODENOSCOPY (EGD) WITH PROPOFOL ;  Surgeon: Unk Corinn Skiff, MD;  Location: [facility];  Service: Gastroenterology;;   ESOPHAGUS SURGERY     GASTROSTOMY N/A 05/25/2023   Procedure: OPEN INSERTION OF GASTROSTOMY TUBE;  Surgeon: Jordis Laneta FALCON, MD;  Location: [facility];  Service: General;  Laterality: N/A;   IR IMAGING GUIDED PORT INSERTION  02/07/2023   IR REPLACE G-TUBE SIMPLE WO FLUORO  02/27/2024   IR REPLC GASTRO/COLONIC TUBE PERCUT W/FLUORO  09/12/2023   IR REPLC GASTRO/COLONIC TUBE PERCUT W/FLUORO  04/19/2024   KNEE SURGERY Right    LIPOSUCTION WITH LIPOFILLING Right 06/10/2019   Procedure: right breast scar and capsule release with fat grafting;  Surgeon: Lowery Estefana RAMAN, DO;  Location: [facility];  Service: Plastics;  Laterality: Right;   POLYPECTOMY  01/05/2023   Procedure: POLYPECTOMY;  Surgeon: Unk Corinn Skiff, MD;  Location: [facility] END[facility];  Service: Gastroenterology;;   [facility] REMOVAL      PORTACATH PLACEMENT Right 01/17/2018   Procedure: INSERTION PORT-A-CATH- RIGHT;  Surgeon: Dessa Reyes ORN, MD;  Location: [facility];  Service: General;  Laterality: Right;   PULMONARY THROMBECTOMY Bilateral 05/12/2023   Procedure: PULMONARY THROMBECTOMY;  Surgeon: Marea Selinda RAMAN, MD;  Location: [facility];  Service: Cardiovascular;  Laterality: Bilateral;   ROBOTIC ADRENALECTOMY Right 12/28/2016   Procedure: ROBOTIC ADRENALECTOMY;  Surgeon: Penne Knee, MD;  Location: [facility];  Service: Urology;  Laterality: Right;   SKIN CANCER EXCISION     THROAT SURGERY  1998   throat cancer    TUBAL LIGATION     VENTRAL HERNIA REPAIR N/A 03/03/2017   Procedure: HERNIA REPAIR VENTRAL ADULT;  Surgeon: Shelva Dunnings, MD;  Location: [facility];  Service: General;  Laterality: N/A;   VIDEO BRONCHOSCOPY WITH ENDOBRONCHIAL ULTRASOUND N/A 01/25/2023   Procedure: VIDEO BRONCHOSCOPY WITH ENDOBRONCHIAL ULTRASOUND;  Surgeon: Parris Manna, MD;  Location: [facility];  Service: Thoracic;  Laterality: N/A;   VIDEO BRONCHOSCOPY WITH ENDOBRONCHIAL ULTRASOUND N/A 02/17/2023   Procedure: VIDEO BRONCHOSCOPY WITH ENDOBRONCHIAL ULTRASOUND;  Surgeon: Parris Manna, MD;  Location: [facility];  Service: Thoracic;  Laterality: N/A;    Family History  Problem Relation Age of Onset   Diabetes Mother    Heart attack Mother    Cancer Sister        cancer of eyes, spread to lung, other places. died at 23   Cancer Sister 44       unknown primary, spread to bone, brain died in 2's   Cancer Paternal Aunt        unk type   Cancer Paternal Grandmother        type unk   Prostate cancer Neg Hx    Kidney cancer Neg Hx    Bladder Cancer Neg Hx    Breast cancer Neg Hx     Social History:  reports that she quit smoking about 29 years ago. Her smoking use included cigarettes. She started smoking about 59 years ago. She has a 45 pack-year smoking history. She has been exposed to tobacco smoke. She has never used smokeless  tobacco. She reports that she does not currently use alcohol after a past usage of about 4.0 standard drinks of alcohol per week. She reports that she does not use drugs.  Allergies: Allergies[1]  Medications reviewed.    ROS Full ROS performed and is otherwise negative other than what is stated in HPI   BP 106/70   Pulse 91   Ht 5' 1 (1.549 m)   Wt 101 lb (45.8 kg)   SpO2 99%   BMI 19.08 kg/m   Physical Exam   Chronically ill, severely malnourished Abd: soft, nt, g tube in place with circumferential mild excoration but w significant improvement as compared to a few weeks ago, no abscess, patent. , no rebound  Assessment/Plan: 69 year old female with metastatic cancer with improvement of her skin issues around G-tube site.  There is still some minimal leaks that is not unexpected given her frailty and chronic diseases.  Not much I can add from a surgical perspective. RTC prn I personally spent a total of 30 minutes in the care of the patient today including performing a medically appropriate exam/evaluation, counseling and educating, placing orders, referring and communicating with other health care professionals, documenting clinical information in the EHR, independently interpreting and reviewing images studies and coordinating care.   Laneta Luna, MD FACS General Surgeon     [1]  Allergies Allergen Reactions   Sulfa  Antibiotics Other (See Comments)    Mouth sore and raw

## 2024-05-14 ENCOUNTER — Ambulatory Visit: Admitting: Speech Pathology

## 2024-05-15 ENCOUNTER — Inpatient Hospital Stay

## 2024-05-15 ENCOUNTER — Encounter: Payer: Self-pay | Admitting: Nurse Practitioner

## 2024-05-15 ENCOUNTER — Inpatient Hospital Stay (HOSPITAL_BASED_OUTPATIENT_CLINIC_OR_DEPARTMENT_OTHER): Admitting: Nurse Practitioner

## 2024-05-15 ENCOUNTER — Ambulatory Visit
Admission: RE | Admit: 2024-05-15 | Discharge: 2024-05-15 | Disposition: A | Discharge status: Home / Self Care | Source: Ambulatory Visit | Attending: Oncology | Admitting: Oncology

## 2024-05-15 VITALS — BP 93/73 | HR 83 | Temp 97.5°F | Resp 16 | Wt 105.0 lb

## 2024-05-15 DIAGNOSIS — C3491 Malignant neoplasm of unspecified part of right bronchus or lung: Secondary | ICD-10-CM | POA: Insufficient documentation

## 2024-05-15 DIAGNOSIS — L03019 Cellulitis of unspecified finger: Secondary | ICD-10-CM

## 2024-05-15 DIAGNOSIS — I700 Atherosclerosis of aorta: Secondary | ICD-10-CM | POA: Insufficient documentation

## 2024-05-15 DIAGNOSIS — L270 Generalized skin eruption due to drugs and medicaments taken internally: Secondary | ICD-10-CM | POA: Diagnosis not present

## 2024-05-15 DIAGNOSIS — E878 Other disorders of electrolyte and fluid balance, not elsewhere classified: Secondary | ICD-10-CM

## 2024-05-15 LAB — COMPREHENSIVE METABOLIC PANEL WITH GFR
ALT: 17 U/L (ref 0–44)
AST: 36 U/L (ref 15–41)
Albumin: 3.5 g/dL (ref 3.5–5.0)
Alkaline Phosphatase: 84 U/L (ref 38–126)
Anion gap: 11 (ref 5–15)
BUN: 23 mg/dL (ref 8–23)
CO2: 27 mmol/L (ref 22–32)
Calcium: 9.2 mg/dL (ref 8.9–10.3)
Chloride: 104 mmol/L (ref 98–111)
Creatinine, Ser: 0.65 mg/dL (ref 0.44–1.00)
GFR, Estimated: >60 mL/min
Glucose, Bld: 95 mg/dL (ref 70–99)
Potassium: 4 mmol/L (ref 3.5–5.1)
Sodium: 141 mmol/L (ref 135–145)
Total Bilirubin: 0.2 mg/dL (ref 0.0–1.2)
Total Protein: 6.3 g/dL — ABNORMAL LOW (ref 6.5–8.1)

## 2024-05-15 LAB — MAGNESIUM: Magnesium: 2.1 mg/dL (ref 1.7–2.4)

## 2024-05-15 LAB — PHOSPHORUS: Phosphorus: 3 mg/dL (ref 2.5–4.6)

## 2024-05-15 LAB — GLUCOSE, CAPILLARY: Glucose-Capillary: 93 mg/dL (ref 70–99)

## 2024-05-15 MED ORDER — DOXYCYCLINE HYCLATE 100 MG PO TABS: 100.0000 mg | ORAL_TABLET | Freq: Two times a day (BID) | ORAL | 0 refills | Status: AC

## 2024-05-15 MED ORDER — DOXYCYCLINE HYCLATE 100 MG PO TABS: 100.0000 mg | ORAL_TABLET | Freq: Every day | ORAL | 2 refills | Status: AC

## 2024-05-15 MED ORDER — TRIAMCINOLONE ACETONIDE 0.5 % EX OINT: 1.0000 | TOPICAL_OINTMENT | Freq: Two times a day (BID) | CUTANEOUS | 0 refills | Status: AC

## 2024-05-15 MED ORDER — FLUDEOXYGLUCOSE F - 18 (FDG) INJECTION
5.7000 | Freq: Once | INTRAVENOUS | Status: AC | PRN
  Administered 2024-05-15: 5.7 via INTRAVENOUS

## 2024-05-15 NOTE — Progress Notes (Signed)
 Nutrition Follow-up:  Patient with metastatic non-small cell lung cancer with bone and lymph node metastasis.  Followed by Dr Melanee, PEG placed 05/25/23.  Tube exchanged in 01/2024.  Recent hospital admission, seen by surgery.  Currently receiving afatinib  and zomata  Reports that she has been giving 2 cartons of formula via feeding tube.  Yesterday gave 1 carton of formula at 8:30-9am and then another one around 6pm.  Says she gave medication separate from tube feeding.  Gave juven about 10 am yesterday but none after that.  Says that she is drinking water , tea, milk but mouth.  Has been informed about aspiration risk.    Reports itchy rash on hands, arm, legs, stomach that she has noticed this week.    Reports that skin around PEG tube site is looking better. Reports that it is itchy on one side under tape.    Medications: reviewed  Labs: K, Mag normal Phosphorus still pending  Anthropometrics:   Weight 105 lb today, increased (removed jacket)  101 lb 1/7 on 1/9 106 lb 01/26/24 106 lb 4 oz on 7/25 107 lb 2 oz on 4/25 109 lb on 3/26 111 lb on 2/21 117 lb on 2/3 119 lb 4.3 oz on 05/30/23   Estimated Energy Needs  Kcals: 1500-1750 Protein: 50-60 g Fluid: 1500-1750 ml  NUTRITION DIAGNOSIS: Inadequate oral intake, relying on feeding tube   INTERVENTION:  Spoke with NP in [facility] and she will see patient today for rash. Reminded patient about wound clinic appointment tomorrow and stressed importance of attending this appointment Recommend 4 cartons of nutren 1.5 daily to meet nutritional needs (8:30 am, 12:30, 4:30, 8:30 pm).  Flush with 60 ml of water  before and after each feeding. Written instructions provided Provides 1500 calories, 68 g protein, 1724 ml (with free water  in formula, flush and juven) Discussed importance of improving nutrition with tube feeding for wound healing and to help with fatigue Provided more samples of juven.  Recommend juven BID (mix in 4-8 oz water ,  dissolve and administer via syringe in feeding tube).  Written instructions provided    MONITORING, EVALUATION, GOAL: weight trends, tube feeding   NEXT VISIT: phone call Thursday, Jan 22  Draco Malczewski B. Dasie SOLON, CSO, LDN Registered Dietitian [phone]

## 2024-05-16 ENCOUNTER — Ambulatory Visit: Attending: Hospice and Palliative Medicine | Admitting: Speech Pathology

## 2024-05-16 ENCOUNTER — Encounter: Admitting: Physician Assistant

## 2024-05-16 ENCOUNTER — Emergency Department

## 2024-05-16 ENCOUNTER — Emergency Department: Admission: EM | Admit: 2024-05-16 | Discharge: 2024-05-16 | Disposition: A | Discharge status: Home / Self Care

## 2024-05-16 ENCOUNTER — Other Ambulatory Visit: Payer: Self-pay

## 2024-05-16 ENCOUNTER — Encounter: Payer: Self-pay | Admitting: Oncology

## 2024-05-16 DIAGNOSIS — R1310 Dysphagia, unspecified: Secondary | ICD-10-CM | POA: Insufficient documentation

## 2024-05-16 DIAGNOSIS — R1313 Dysphagia, pharyngeal phase: Secondary | ICD-10-CM | POA: Insufficient documentation

## 2024-05-16 DIAGNOSIS — J449 Chronic obstructive pulmonary disease, unspecified: Secondary | ICD-10-CM | POA: Insufficient documentation

## 2024-05-16 DIAGNOSIS — K9423 Gastrostomy malfunction: Secondary | ICD-10-CM | POA: Insufficient documentation

## 2024-05-16 MED ORDER — DIATRIZOATE MEGLUMINE & SODIUM 66-10 % PO SOLN: 30.0000 mL | Freq: Once | ORAL | Status: DC

## 2024-05-16 MED ORDER — OXYCODONE-ACETAMINOPHEN 5-325 MG PO TABS: 1.0000 | ORAL_TABLET | Freq: Once | ORAL | Status: DC

## 2024-05-16 MED ORDER — FENTANYL CITRATE (PF) 50 MCG/ML IJ SOSY
50.0000 ug | PREFILLED_SYRINGE | Freq: Once | INTRAMUSCULAR | Status: AC
  Administered 2024-05-16: 50 ug via INTRAVENOUS
  Filled 2024-05-16: qty 1

## 2024-05-16 NOTE — ED Triage Notes (Signed)
 Pt to ED as rapid response from medical mall. Pt reports visiting someone and had someone attempting to do a tube feed and her g-tube dislodged.
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
## 2024-05-16 NOTE — Progress Notes (Signed)
 "    Hematology/Oncology Consult note [facility]  [phone] [phone]  Patient Care Team: [name], DO as PCP - General (Family Medicine) Darliss Rogue, MD as PCP - Cardiology (Cardiology) Melanee Annah BROCKS, MD as Consulting Physician (Oncology) Jordis Laneta FALCON, MD as Consulting Physician (General Surgery) Dasher, Alm LABOR, MD (Dermatology) Scheeler, Donnice PARAS, PA-C (Inactive) as Physician Assistant (Plastic Surgery) Dorothyann Drivers, MD as Attending Physician (Emergency Medicine) Ferrel, Lionel Sotero RIGGERS (Neurology) French Mering, MD as Referring Physician (Internal Medicine) Shellia Oh, MD as Consulting Physician (Pulmonary Disease) Pa, [facility] (Optometry) Verdene Gills, RN as Oncology Nurse Navigator Lenn Aran, MD as Consulting Physician (Radiation Oncology) Moises Reusing, RN as [facility] [place], LCSW as [facility] [place], [place], [place]   Name of the patient: Denise Watts  [id number]  [birth year]   Date of visit: 05/16/24  Diagnosis-metastatic lung cancer with bone metastases and uncommon eGFR mutation  Chief complaint/ Reason for visit-routine follow-up of lung cancer presently on afatinib  now with itchy rash and irritation and soreness around nail beds on hands  Heme/Onc history:  Oncology History Overview Note  1. Carcinoma of breast, T1, N0, M0 tumor diagnosis.  In January of 1997 2. Carcinoma of the vocal cord T1, N0, M0 tumor.  Status postradiation therapy 3. Abnormal CT scan of the chest (December, 2015) 4. Repeat CT scan of chest shows stable nodule (July, 2016) 5. Neutropenia with normal hemoglobin and normal platelet count (July, 2016)   Cancer of vocal cord [facility]) (Resolved)  11/10/2014 Initial Diagnosis   Cancer of vocal cord   Malignant neoplasm of lower-outer quadrant of right breast of female, estrogen receptor negative (HCC)  11/24/2017  Initial Diagnosis   Malignant neoplasm of lower-outer quadrant of right breast of female, estrogen receptor negative (HCC)   01/12/2018 Cancer Staging   Staging form: Breast, AJCC 8th Edition - Clinical stage from 01/12/2018: Stage IB (cT1a, cN0, cM0, G3, ER+, PR-, HER2-) - Signed by Melanee Annah BROCKS, MD on 01/12/2018   01/30/2018 - 04/26/2018 Chemotherapy   The patient had dexamethasone  (DECADRON ) 4 MG tablet, 8 mg, Oral, 2 times daily, 1 of 1 cycle, Start date: 01/12/2018, End date: 08/03/2018 palonosetron  (ALOXI ) injection 0.25 mg, 0.25 mg, Intravenous,  Once, 4 of 4 cycles Administration: 0.25 mg (01/30/2018), 0.25 mg (02/27/2018), 0.25 mg (03/28/2018), 0.25 mg (04/26/2018) pegfilgrastim  (NEULASTA  ONPRO KIT) injection 6 mg, 6 mg, Subcutaneous, Once, 4 of 4 cycles Administration: 6 mg (01/30/2018), 6 mg (02/27/2018), 6 mg (03/28/2018), 6 mg (04/26/2018) cyclophosphamide  (CYTOXAN ) 1,000 mg in sodium chloride  0.9 % 250 mL chemo infusion, 600 mg/m2 = 1,000 mg, Intravenous,  Once, 4 of 4 cycles Administration: 1,000 mg (01/30/2018), 1,000 mg (02/27/2018), 1,000 mg (03/28/2018), 1,000 mg (04/26/2018) DOCEtaxel  (TAXOTERE ) 130 mg in sodium chloride  0.9 % 250 mL chemo infusion, 75 mg/m2 = 130 mg, Intravenous,  Once, 4 of 4 cycles Dose modification: 60 mg/m2 (original dose 75 mg/m2, Cycle 2, Reason: Dose not tolerated) Administration: 130 mg (01/30/2018), 100 mg (02/27/2018), 100 mg (03/28/2018), 100 mg (04/26/2018)  for chemotherapy treatment.    Non-small cell lung cancer (HCC)  03/09/2023 Initial Diagnosis   Non-small cell lung cancer (HCC)   03/20/2023 -  Chemotherapy   Patient is on Treatment Plan : LUNG Carboplatin  + Paclitaxel  q7d     Primary malignant neoplasm of right lung metastatic to other site [facility])  01/22/2024 Initial Diagnosis   Primary malignant neoplasm of right lung metastatic to  other site [facility])   01/22/2024 Cancer Staging   Staging form: Lung, AJCC V9 - Clinical stage from 01/22/2024:  rcT4, rcN3, rpM1 - Signed by Melanee Annah BROCKS, MD on 01/22/2024 Stage prefix: Recurrence     NGS testing from scapular biopsy showed evidence of eGFR point mutation P.G719S and P.V769M.  She was started on afatinib  on 02/12/2024   Interval history-   Patient requested to see [facility] today with complaints of red, itchy rash and irritation and pain around nail beds on hands.  Patient does have acneiform eruptions as well as paronychia around nails on hands.  The acneiform rash is seen today on her arms, face, chest, legs and back of hands.  Discussed case with Dr. Melanee and Alyson in pharmacy.  This is likely coming from afatinib  as it has a high percentage of dermatologic adverse reactions.   Patient denies any fever/chills, no open wounds or drainage today            History of Present Illness   Denise Watts is a 69 year old female with stage IV EGFR-mutant right lung cancer with bone metastases who presents for oncology follow-up and management of ongoing symptoms and treatment side effects.  She has been taking afatinib  daily since February 12, 2024, for metastatic lung cancer with an uncommon EGFR mutation.  Today she presents to [facility] with acneiform rash on arms, hand, legs, some small areas of chest and face.  She also has paronychia of 4 nails.  No open draining wounds.  Patient denies any fever/chills, pain.  She does endorse pruritus to arms and legs.  Reviewed with Dr. Melanee and Alyson and poc discussed.       ECOG PS- 2 Pain scale- 3 Opioid associated constipation- no  Review of systems- Review of Systems  Constitutional:  Positive for chills, fever and malaise/fatigue.  Respiratory:  Positive for cough and hemoptysis.   Gastrointestinal:        Pain and discharge around the PEG tube site  Skin:  Negative for rash.       Itchy red rash on face, arms, legs, chest, abdomen, hands  Neurological:  Positive for dizziness and tingling.      Allergies[1]   Past Medical History:   Diagnosis Date   Acute pulmonary embolism (HCC) 05/10/2023   Adenomatous colon polyp    Adrenal carcinoma, right (HCC) 12/28/2016   a.) high grade carcinoma with extensive necrosis --> s/p adrenalectomy   Anxiety    Aortic atherosclerosis    Arthritis of right knee    Atrial fibrillation and flutter (HCC)    a.) CHA2DS2-VASc = 3 (age, sex, vascular disease history) as of 02/13/2023; b.) cardiac rate/rhythm maintained intrinsically without pharmacological intervention; no chronic OAC (does take low dose ASA)   B12 deficiency    Breast cancer (HCC) 1997   a.) stage IB (cT1a, cN0, cM0, G3, ER+, PR-, HER2-); s/p lumpectomy + XRT   COPD (chronic obstructive pulmonary disease) (HCC)    Coronary artery disease    a.) cCTA 12/09/2021: Ca2+ = 135 (80th %'ile for age/sex/race match contol) --> 25-49% pLAD and RCA distributions   Depression    Diastolic dysfunction    a.) TTE 12/11/2020, no RWMAs, norm RVSF, G1DD; b.) TTE 12/14/2021: EF 55-60, no RWMAs, norm RVSF, G1DD; c.) TTE 01/27/2023: EF 50-55%, no RWMAs, G1DD, mild RAE, mild MR, mod TR   Dry cough 03/02/2017   Dyspnea    Full dentures    GERD (gastroesophageal reflux disease)  History of hiatal hernia    HLD (hyperlipidemia)    Invasive carcinoma of breast (HCC) 11/15/2017   a.) path (+) for G3, ER -, PR -, HER2/neu - ; s/p lumpectomy 01/17/2018 + 4 cycles TC systemic chemotherapy + XRT   Long term current use of aspirin     Personal history of chemotherapy    Personal history of radiation therapy    Pneumothorax of left lung after biopsy 01/25/2023   a.) hypoxic in PACU requiring re-intubation --> transferred to ICU --> chest tube placed by PCCM   RLS (restless legs syndrome)    a.) on ropinirole    Skin cancer    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   a.) Stage IB (cT1a, cN0, cM0, G3, ER+, PR-, HER2-)     Past Surgical History:  Procedure Laterality Date   BIOPSY  01/05/2023   Procedure: BIOPSY;  Surgeon: Unk Corinn Skiff, MD;  Location: [facility] ENDOSCOPY;  Service: Gastroenterology;;   BREAST BIOPSY Right 1997   positive   BREAST BIOPSY Right 11/15/2017   INVASIVE MAMMARY CARCINOMA triple negative   BREAST BIOPSY Right 07/20/2020   us  bx, Q marker, neg   BREAST LUMPECTOMY Right 1997   2019 also   BREAST LUMPECTOMY WITH SENTINEL LYMPH NODE BIOPSY Right 12/08/2017   Procedure: BREAST LUMPECTOMY WITH SENTINEL LYMPH NODE BX;  Surgeon: Dessa Reyes ORN, MD;  Location: [facility] ORS;  Service: General;  Laterality: Right;   BREAST REDUCTION WITH MASTOPEXY Left 06/10/2019   Procedure: Left breast mastopexy reduction for symmetry;  Surgeon: Lowery Estefana RAMAN, DO;  Location:  SURGERY CENTER;  Service: Plastics;  Laterality: Left;  total 2.5 hours   CATARACT EXTRACTION W/PHACO Right 02/07/2022   Procedure: CATARACT EXTRACTION PHACO AND INTRAOCULAR LENS PLACEMENT (IOC) RIGHT DIABETIC;  Surgeon: Myrna Adine Anes, MD;  Location: [facility];  Service: Ophthalmology;  Laterality: Right;  3.60 00:34.7   CATARACT EXTRACTION W/PHACO Left 02/28/2022   Procedure: CATARACT EXTRACTION PHACO AND INTRAOCULAR LENS PLACEMENT (IOC) LEFT DIABETIC 4.11 00:26.1;  Surgeon: Myrna Adine Anes, MD;  Location: [facility];  Service: Ophthalmology;  Laterality: Left;   COLONOSCOPY  2015   COLONOSCOPY N/A 01/05/2023   Procedure: COLONOSCOPY;  Surgeon: Unk Corinn Skiff, MD;  Location: [facility] END[facility];  Service: Gastroenterology;  Laterality: N/A;   COLONOSCOPY WITH PROPOFOL  N/A 10/29/2019   Procedure: COLONOSCOPY WITH PROPOFOL ;  Surgeon: Jinny Carmine, MD;  Location: [facility] ENDOSCOPY;  Service: Endoscopy;  Laterality: N/A;   ESOPHAGOGASTRODUODENOSCOPY (EGD) WITH PROPOFOL  N/A 05/24/2019   Procedure: ESOPHAGOGASTRODUODENOSCOPY (EGD) WITH PROPOFOL ;  Surgeon: Jinny Carmine, MD;  Location: [facility] ENDOSCOPY;  Service: Endoscopy;  Laterality: N/A;   ESOPHAGOGASTRODUODENOSCOPY (EGD) WITH PROPOFOL  N/A 09/06/2022    Procedure: ESOPHAGOGASTRODUODENOSCOPY (EGD) WITH PROPOFOL ;  Surgeon: Jinny Carmine, MD;  Location: [facility];  Service: Endoscopy;  Laterality: N/A;   ESOPHAGOGASTRODUODENOSCOPY (EGD) WITH PROPOFOL   01/05/2023   Procedure: ESOPHAGOGASTRODUODENOSCOPY (EGD) WITH PROPOFOL ;  Surgeon: Unk Corinn Skiff, MD;  Location: [facility];  Service: Gastroenterology;;   ESOPHAGUS SURGERY     GASTROSTOMY N/A 05/25/2023   Procedure: OPEN INSERTION OF GASTROSTOMY TUBE;  Surgeon: Jordis Laneta FALCON, MD;  Location: [facility] ORS;  Service: General;  Laterality: N/A;   IR IMAGING GUIDED PORT INSERTION  02/07/2023   IR REPLACE G-TUBE SIMPLE WO FLUORO  02/27/2024   IR REPLC GASTRO/COLONIC TUBE PERCUT W/FLUORO  09/12/2023   IR REPLC GASTRO/COLONIC TUBE PERCUT W/FLUORO  04/19/2024   KNEE SURGERY Right    LIPOSUCTION WITH LIPOFILLING Right  06/10/2019   Procedure: right breast scar and capsule release with fat grafting;  Surgeon: Lowery Estefana RAMAN, DO;  Location: [facility];  Service: Plastics;  Laterality: Right;   POLYPECTOMY  01/05/2023   Procedure: POLYPECTOMY;  Surgeon: Unk Corinn Skiff, MD;  Location: [facility] ENDOSCOPY;  Service: Gastroenterology;;   [facility] REMOVAL     PORTACATH PLACEMENT Right 01/17/2018   Procedure: INSERTION PORT-A-CATH- RIGHT;  Surgeon: Dessa Reyes ORN, MD;  Location: [facility]S;  Service: General;  Laterality: Right;   PULMONARY THROMBECTOMY Bilateral 05/12/2023   Procedure: PULMONARY THROMBECTOMY;  Surgeon: Marea Selinda RAMAN, MD;  Location: [facility];  Service: Cardiovascular;  Laterality: Bilateral;   ROBOTIC ADRENALECTOMY Right 12/28/2016   Procedure: ROBOTIC ADRENALECTOMY;  Surgeon: Penne Knee, MD;  Location: [facility] ORS;  Service: Urology;  Laterality: Right;   SKIN CANCER EXCISION     THROAT SURGERY  1998   throat cancer    TUBAL LIGATION     VENTRAL HERNIA REPAIR N/A 03/03/2017   Procedure: HERNIA REPAIR VENTRAL ADULT;  Surgeon: Shelva Dunnings, MD;   Location: [facility];  Service: General;  Laterality: N/A;   VIDEO BRONCHOSCOPY WITH ENDOBRONCHIAL ULTRASOUND N/A 01/25/2023   Procedure: VIDEO BRONCHOSCOPY WITH ENDOBRONCHIAL ULTRASOUND;  Surgeon: Parris Manna, MD;  Location: [facility];  Service: Thoracic;  Laterality: N/A;   VIDEO BRONCHOSCOPY WITH ENDOBRONCHIAL ULTRASOUND N/A 02/17/2023   Procedure: VIDEO BRONCHOSCOPY WITH ENDOBRONCHIAL ULTRASOUND;  Surgeon: Parris Manna, MD;  Location: [facility];  Service: Thoracic;  Laterality: N/A;    Social History   Socioeconomic History   Marital status: Married    Spouse name: Morais,Joseph (Spouse) [phone] ([place])   Number of children: 4   Years of education: Not on file   Highest education level: 10th grade  Occupational History   Occupation: retired  Tobacco Use   Smoking status: Former    Current packs/day: 0.00    Average packs/day: 1.5 packs/day for 30.0 years (45.0 ttl pk-yrs)    Types: Cigarettes    Start date: 05/02/1965    Quit date: 05/03/1995    Years since quitting: 29.0    Passive exposure: Past   Smokeless tobacco: Never  Vaping Use   Vaping status: Never Used  Substance and Sexual Activity   Alcohol use: Not Currently    Alcohol/week: 4.0 standard drinks of alcohol    Types: 4 Shots of liquor per week    Comment: none   Drug use: Never   Sexual activity: Not Currently    Birth control/protection: Post-menopausal  Other Topics Concern   Not on file  Social History Narrative   Patient from home with husband.   Social Drivers of Health   Tobacco Use: Medium Risk (05/15/2024)   Patient History    Smoking Tobacco Use: Former    Smokeless Tobacco Use: Never    Passive Exposure: Past  Physicist, Medical Strain: Low Risk (12/06/2023)   Overall Financial Resource Strain (CARDIA)    Difficulty of Paying Living Expenses: Not hard at all  Food Insecurity: No Food Insecurity (04/18/2024)   Epic    Worried About Programme Researcher, Broadcasting/film/video in the Last Year: Never true    Ran  Out of Food in the Last Year: Never true  Transportation Needs: No Transportation Needs (04/18/2024)   Epic    Lack of Transportation (Medical): No    Lack of Transportation (Non-Medical): No  Physical Activity: Insufficiently Active (12/06/2023)   Exercise Vital Sign    Days of Exercise per  Week: 3 days    Minutes of Exercise per Session: 30 min  Stress: No Stress Concern Present (12/06/2023)   [employer] of Occupational Health - Occupational Stress Questionnaire    Feeling of Stress: Only a little  Social Connections: Moderately Isolated (04/18/2024)   Social Connection and Isolation Panel    Frequency of Communication with Friends and Family: More than three times a week    Frequency of Social Gatherings with Friends and Family: Never    Attends Religious Services: Never    Database Administrator or Organizations: No    Attends Banker Meetings: Never    Marital Status: Married  Catering Manager Violence: Not At Risk (04/18/2024)   Epic    Fear of Current or Ex-Partner: No    Emotionally Abused: No    Physically Abused: No    Sexually Abused: No  Depression (PHQ2-9): Low Risk (05/15/2024)   Depression (PHQ2-9)    PHQ-2 Score: 0  Recent Concern: Depression (PHQ2-9) - Medium Risk (04/16/2024)   Depression (PHQ2-9)    PHQ-2 Score: 6  Alcohol Screen: Low Risk (12/06/2023)   Alcohol Screen    Last Alcohol Screening Score (AUDIT): 0  Housing: Low Risk (04/18/2024)   Epic    Unable to Pay for Housing in the Last Year: No    Number of Times Moved in the Last Year: 0    Homeless in the Last Year: No  Utilities: Not At Risk (04/18/2024)   Epic    Threatened with loss of utilities: No  Health Literacy: Adequate Health Literacy (12/06/2023)   B1300 Health Literacy    Frequency of need for help with medical instructions: Never    Family History  Problem Relation Age of Onset   Diabetes Mother    Heart attack Mother    Cancer Sister        cancer of eyes, spread  to lung, other places. died at 85   Cancer Sister 85       unknown primary, spread to bone, brain died in 10's   Cancer Paternal Aunt        unk type   Cancer Paternal Grandmother        type unk   Prostate cancer Neg Hx    Kidney cancer Neg Hx    Bladder Cancer Neg Hx    Breast cancer Neg Hx     Current Medications[2]  Physical exam:  Vitals:   05/15/24 1525  BP: 93/73  Pulse: 83  Resp: 16  Temp: (!) 97.5 F (36.4 C)  TempSrc: Tympanic  SpO2: 100%  Weight: 105 lb (47.6 kg)   Physical Exam Cardiovascular:     Rate and Rhythm: Normal rate and regular rhythm.     Heart sounds: Normal heart sounds.  Pulmonary:     Effort: Pulmonary effort is normal.     Breath sounds: Normal breath sounds.  Abdominal:     General: Bowel sounds are normal.     Palpations: Abdomen is soft.     Comments: There is significant erythema and excoriation noted around the PEG tube site.  Gastric contents are pouring out around the PEG tube  Skin:    General: Skin is warm and dry.     Comments: Paronychia around nails on both hands Widespread acneiform eruptions on face, both arms, both legs, chest, and back of hands  Neurological:     Mental Status: She is alert and oriented to person, place, and time.  I have personally reviewed labs listed below:    Latest Ref Rng & Units 05/15/2024    2:41 PM  CMP  Glucose 70 - 99 mg/dL 95   BUN 8 - 23 mg/dL 23   Creatinine 9.55 - 1.00 mg/dL 9.34   Sodium 864 - 854 mmol/L 141   Potassium 3.5 - 5.1 mmol/L 4.0   Chloride 98 - 111 mmol/L 104   CO2 22 - 32 mmol/L 27   Calcium  8.9 - 10.3 mg/dL 9.2   Total Protein 6.5 - 8.1 g/dL 6.3   Total Bilirubin 0.0 - 1.2 mg/dL 0.2   Alkaline Phos 38 - 126 U/L 84   AST 15 - 41 U/L 36   ALT 0 - 44 U/L 17       Latest Ref Rng & Units 05/10/2024   10:59 AM  CBC  WBC 4.0 - 10.5 K/uL 6.6   Hemoglobin 12.0 - 15.0 g/dL 88.9   Hematocrit 63.9 - 46.0 % 36.0   Platelets 150 - 400 K/uL 312    I have personally  reviewed Radiology images listed below:   DG SWALLOW FUNC OP MEDICARE SPEECH PATH Result Date: 05/08/2024 CLINICAL DATA:  69 year old female with a history of head neck cancer s/p radiation and associated dysphagia. Patient requesting gastrostomy tube removal due to extreme discomfort. Modified barium swallow ordered for evaluation prior to removal. EXAM: MODIFIED BARIUM SWALLOW TECHNIQUE: Different consistencies of barium were administered orally to the patient by the Speech Pathologist. Imaging of the pharynx was performed in the lateral projection. McKenzie McInnis PA-C was present in the fluoroscopy room during this study, which was supervised and interpreted by Dr. Juliene Balder. FLUOROSCOPY: Radiation Exposure Index (as provided by the fluoroscopic device): 3.7 mGy Kerma COMPARISON:  None Available. FINDINGS: Vestibular Penetration: Penetration seen with thin and nectar thick liquids. Aspiration:  Silent aspiration with thin and nectar thick liquids. Other:  None. IMPRESSION: Penetration and aspiration noted with thin and thick liquids. Please refer to the Speech Pathologists report for complete details and recommendations. Electronically Signed   By: Juliene Balder M.D.   On: 05/08/2024 17:03   IR Replc Gastro/Colonic Tube Percut W/Fluoro Result Date: 04/19/2024 INDICATION: Indwelling gastrostomy tube (18 French) previously exchanged by IR now with episodic bleeding at the ostomy site as well as leakage of gastric contents onto the patient's skin. EXAM: Up sizing of gastrostomy tube under fluoroscopy ANESTHESIA/SEDATION: Moderate (conscious) sedation was NOT employed during this procedure. A total of Fentanyl  50 mcg was administered intravenously by the radiology nurse for pain control. CONTRAST:  8 mL Omnipaque  300-administered into the gastric lumen. FLUOROSCOPY: Radiation Exposure Index (as provided by the fluoroscopic device): 1 mGy Kerma COMPLICATIONS: None immediate. PROCEDURE: Informed written consent  was obtained from the patient after a thorough discussion of the procedural risks, benefits and alternatives. All questions were addressed. Maximal Sterile Barrier Technique was utilized including caps, mask, sterile gowns, sterile gloves, sterile drape, hand hygiene and skin antiseptic. A timeout was performed prior to the initiation of the procedure. In a supine position, the patient's epigastric region was prepped and draped in usual sterile fashion. Dilute contrast was injected through the indwelling gastrostomy tube demonstrating filling of the stomach and easy flowing of contrast through the tube and into the stomach lumen. No contrast extravasation. The retention balloon was deflated. A short Amplatz wire was advanced through the gastrostomy tube and coiled in the stomach. The gastrostomy tube was then removed from the patient. A new larger (20 French)  gastrostomy tube was then advanced over the wire and placed in the stomach. Retention balloon was then filled with 10 mL normal saline. Gastrostomy tube was then partially retracted in the [facility] disc was closely applied to the patient's skin. Silver  nitrate was then liberally applied at the ostomy to cauterize any neovascularity that may be causing intermittent bleeding at the ostomy site. Sterile dressing was applied. IMPRESSION: Satisfactory up sizing of the gastrostomy tube from 6 French to 20 French. Successful chemical cautery of the neovascularity at the ostomy site using silver  nitrate. Electronically Signed   By: Cordella Banner   On: 04/19/2024 16:26   CT ABDOMEN PELVIS W CONTRAST Result Date: 04/18/2024 CLINICAL DATA:  Abdominal pain and bleeding around gastrostomy tube site. Gastrostomy tube exchange 02/27/2024. EXAM: CT ABDOMEN AND PELVIS WITH CONTRAST TECHNIQUE: Multidetector CT imaging of the abdomen and pelvis was performed using the standard protocol following bolus administration of intravenous contrast. RADIATION DOSE REDUCTION: This  exam was performed according to the departmental dose-optimization program which includes automated exposure control, adjustment of the mA and/or kV according to patient size and/or use of iterative reconstruction technique. CONTRAST:  75mL OMNIPAQUE  IOHEXOL  300 MG/ML  SOLN COMPARISON:  12/13/2023, 05/26/2018 FINDINGS: Lower chest: Heart is normal size. Chronic changes over the right breast. Visualized lung bases demonstrate no acute process. Interval improvement of previously seen multiple small bilateral nodules over the lung bases in this patient with known metastatic lung cancer. Calcified plaque over the descending thoracic aorta. Hepatobiliary: Liver, gallbladder and biliary tree are normal. Pancreas: Normal. Spleen: Normal. Adrenals/Urinary Tract: Left adrenal gland is normal. Right adrenal gland is not well visualized. Kidneys are normal in size without hydronephrosis or nephrolithiasis. Couple small left renal cysts unchanged. Ureters and bladder are normal. Stomach/Bowel: Evidence of patient's percutaneous gastrostomy tube as the balloon portion of the tube is not well-defined as likely some misshapen containing small amount of air. Tube is in otherwise adequate position. No concerning fluid collection along the tube tract. Small bowel is within normal. Appendix not visualized. Mild diverticulosis of the colon without active inflammation. Vascular/Lymphatic: Moderate calcified plaque over the abdominal aorta which is normal caliber. No adenopathy. Reproductive: Uterus and bilateral adnexa are unremarkable. Other: No significant free fluid or focal inflammatory change. Musculoskeletal: No focal abnormality. IMPRESSION: 1. No acute findings in the abdomen/pelvis. 2. Evidence of patient's percutaneous gastrostomy tube as the balloon portion of the tube is not well-defined and is likely misshapen containing small amount of air. Tube is in otherwise adequate position. No concerning fluid collection along the  tube tract. 3. Mild colonic diverticulosis without active inflammation. 4. Couple small left renal cysts unchanged. 5. Interval improvement of previously seen multiple small bilateral nodules over the lung bases in this patient with known metastatic lung cancer. 6. Aortic atherosclerosis. Aortic Atherosclerosis (ICD10-I70.0). Electronically Signed   By: Toribio Agreste M.D.   On: 04/18/2024 15:20     Assessment and plan- Patient is a 69 y.o. female with history of metastatic adenocarcinoma of the lung with bilateral lung and bone metastases and atypical eGFR mutation currently on a afatinib  here for symptom management visit for acneiform eruptions and paronychia of nails on hands  Acneiform eruptions:  grade 2 acneiform rash with pruritus discussed with Dr. Melanee and Alyson Pharmacist instructed the patient to stop taking afatinib  at this time.  Ordered Doxycycline  100 mg twice daily for 14 days followed by prophylactic dose of 100 mg daily ongoing.  Ordered triamcinolone  ointment for arms, legs and  hand for rash.  F/U next week for assessment of rash and to discuss poc to restart afatinib  if appropriate at that time.    Paronychia: likely adverse reaction of afatinib , stop afatinib  at this time start doxycycline  100 mg bid x 14 days f/u next week will continue to closely monitor for resolution instructed her to call before next week for any worsening rash, fever/chills, worsening paronychia   Follow up plan:  Hold afatinib  Sent in RX for doxycycline  and triamcinolone  F/U in 1 week with Dr. Melanee for assessment of rash and consider restarting dose reduced afatinib  LP      Visit Diagnosis No diagnosis found.    Morna Husband AGNP-C [facility] [phone] 05/16/2024 8:56 AM                    [1]  Allergies Allergen Reactions   Sulfa  Antibiotics Other (See Comments)    Mouth sore and raw  [2]  Current Outpatient Medications:    albuterol   (VENTOLIN  HFA) 108 (90 Base) MCG/ACT inhaler, Inhale 2 puffs into the lungs every 6 (six) hours as needed for wheezing or shortness of breath., Disp: 51 each, Rfl: 2   bisacodyl  (DULCOLAX) 10 MG suppository, Place 1 suppository (10 mg total) rectally daily as needed for moderate constipation or severe constipation., Disp: , Rfl:    busPIRone  (BUSPAR ) 5 MG tablet, Take 1 tablet (5 mg total) by mouth 3 (three) times daily as needed., Disp: 270 tablet, Rfl: 2   doxycycline  (VIBRA -TABS) 100 MG tablet, Take 1 tablet (100 mg total) by mouth 2 (two) times daily for 14 days. Via peg, Disp: 28 tablet, Rfl: 0   [START ON 05/30/2024] doxycycline  (VIBRA -TABS) 100 MG tablet, Take 1 tablet (100 mg total) by mouth daily. Once 14 day dose completed start 100 mg tabs via peg daily for prophylactic treatment for rash, Disp: 90 tablet, Rfl: 2   DULoxetine  (CYMBALTA ) 60 MG capsule, TAKE 1 CAPSULE BY MOUTH DAILY, Disp: 90 capsule, Rfl: 0   fentaNYL  (DURAGESIC ) 12 MCG/HR, Place 1 patch onto the skin every 3 (three) days., Disp: 10 patch, Rfl: 0   Fluticasone -Umeclidin-Vilant (TRELEGY ELLIPTA ) 100-62.5-25 MCG/ACT AEPB, Inhale 1 Act into the lungs daily., Disp: 1 each, Rfl: 5   GILOTRIF  30 MG tablet, TAKE 1 TABLET DAILY ON AN EMPTY STOMACH AT LEAST 1 HOUR BEFORE OR 2 HOURS AFTER MEALS, Disp: 30 tablet, Rfl: 1   ipratropium-albuterol  (DUONEB) 0.5-2.5 (3) MG/3ML SOLN, Take 3 mLs by nebulization every 4 (four) hours as needed., Disp: , Rfl:    lactulose , encephalopathy, (CHRONULAC ) 10 GM/15ML SOLN, Place 30 mLs (20 g total) into feeding tube 2 (two) times daily., Disp: 946 mL, Rfl: 3   lidocaine  (LIDODERM ) 5 %, Place 1 patch onto the skin daily. Remove & Discard patch within 12 hours or as directed by MD, Disp: 30 patch, Rfl: 2   meloxicam  (MOBIC ) 7.5 MG tablet, Take 1 tablet (7.5 mg total) by mouth daily., Disp: 90 tablet, Rfl: 1   montelukast  (SINGULAIR ) 10 MG tablet, TAKE 1 TABLET BY MOUTH AT BEDTIME, Disp: 90 tablet, Rfl: 0    Nutritional Supplements (NUTREN 1.5) LIQD, Give 1 carton 4 times a day (8am, noon, 4pm and 8pm). Flush with 60ml of water  before and after each feeding.  Give additional 1 1/2 cups of water  a day via tube or drink orally for adequate hydration., Disp: , Rfl:    ondansetron  (ZOFRAN -ODT) 4 MG disintegrating tablet, Take 1 tablet (  4 mg total) by mouth every 8 (eight) hours as needed for nausea or vomiting., Disp: 20 tablet, Rfl: 0   oxyCODONE  (ROXICODONE ) 5 MG/5ML solution, Take 5 mLs (5 mg total) by mouth every 8 (eight) hours as needed for severe pain (pain score 7-10)., Disp: 200 mL, Rfl: 0   pregabalin (LYRICA) 25 MG capsule, Take 1 capsule (25 mg total) by mouth 2 (two) times daily., Disp: 60 capsule, Rfl: 0   rOPINIRole  (REQUIP ) 0.5 MG tablet, Take 1 tablet (0.5 mg total) by mouth 3 (three) times daily., Disp: 90 tablet, Rfl: 1   rosuvastatin  (CRESTOR ) 40 MG tablet, PLACE 1 TABLET (40 MG TOTAL) INTO FEEDING TUBE DAILY, Disp: 90 tablet, Rfl: 0   senna (SENOKOT) 8.6 MG TABS tablet, Place 1 tablet (8.6 mg total) into feeding tube daily., Disp: , Rfl:    sucralfate  (CARAFATE ) 1 GM/10ML suspension, PLACE 10 ML (1 GRAM TOTAL) INTO FEEDING TUBE 4 TIMES DAILY, Disp: 1200 mL, Rfl: 2   tolterodine  (DETROL  LA) 4 MG 24 hr capsule, Take 4 mg by mouth daily., Disp: , Rfl:    triamcinolone  ointment (KENALOG ) 0.5 %, Apply 1 Application topically 2 (two) times daily for 14 days. Apply to arms, chest and legs for 14 days for itching, Disp: 30 g, Rfl: 0   Water  For Irrigation, Sterile (FREE WATER ) SOLN, Place 60 mLs into feeding tube 5 (five) times daily., Disp: , Rfl:    benzonatate  (TESSALON ) 100 MG capsule, Take 2 capsules (200 mg total) by mouth 3 (three) times daily as needed for cough. (Patient not taking: Reported on 05/15/2024), Disp: , Rfl:    cyclobenzaprine  (FLEXERIL ) 5 MG tablet, Take 5 mg by mouth 3 (three) times daily as needed for muscle spasms. (Patient not taking: Reported on 05/15/2024), Disp: , Rfl:     omeprazole  (KONVOMEP ) 2 mg/mL SUSP oral suspension, Take 20 mLs (40 mg total) by mouth daily. (Patient not taking: Reported on 05/15/2024), Disp: 240 mL, Rfl: 1 No current facility-administered medications for this visit.  Facility-Administered Medications Ordered in Other Visits:    cyanocobalamin  (VITAMIN B12) injection 1,000 mcg, 1,000 mcg, Intramuscular, Q30 days, Melanee Annah BROCKS, MD, 1,000 mcg at 03/16/22 1101  "

## 2024-05-16 NOTE — ED Provider Notes (Signed)
 "  [facility] Provider Note    Event Date/Time   First MD Initiated Contact with Patient 05/16/24 1252     (approximate)   History   G-tube Problem   HPI  Denise Watts is a 69 y.o. female denting to the emerged see department after her G-tube was dislodged.  Patient reports that she is unable to take anything by mouth.  She states that she was at a speech therapy appointment when they attempted to give her a feeding but accidentally put the feed through the balloon which then popped.     Physical Exam   Triage Vital Signs: ED Triage Vitals [05/16/24 1203]  Encounter Vitals Group     BP 120/73     Girls Systolic BP Percentile      Girls Diastolic BP Percentile      Boys Systolic BP Percentile      Boys Diastolic BP Percentile      Pulse Rate 94     Resp 18     Temp 98.1 F (36.7 C)     Temp Source Oral     SpO2 100 %     Weight      Height      Head Circumference      Peak Flow      Pain Score 3     Pain Loc      Pain Education      Exclude from Growth Chart     Most recent vital signs: Vitals:   05/16/24 1203 05/16/24 1309  BP: 120/73   Pulse: 94   Resp: 18   Temp: 98.1 F (36.7 C)   SpO2: 100% 100%     General: Awake, no distress.  CV:  Good peripheral perfusion.  Resp:  Normal effort.  Abd:  No distention.  Other:     ED Results / Procedures / Treatments   Labs (all labs ordered are listed, but only abnormal results are displayed) Labs Reviewed - No data to display   EKG     RADIOLOGY Abdomen XR: IMPRESSION: Gastrostomy tube tip appears to be well positioned within distal gastric lumen.    PROCEDURES:  Critical Care performed: No  Procedures   MEDICATIONS ORDERED IN ED: Medications  fentaNYL  (SUBLIMAZE ) injection 50 mcg (has no administration in time range)     IMPRESSION / MDM / ASSESSMENT AND PLAN / ED COURSE  I reviewed the triage vital signs and the nursing notes.                               Differential diagnosis includes, but is not limited to,   Patient's presentation is most consistent with acute, uncomplicated illness.  Patient is a 69 year old female with past medical history of stricture and stenosis of the esophagus, COPD, dysphagia, presenting to the emergency department after her G-tube was dislodged.  Patient was treated with 50 mcg of fentanyl .  An 47 French G-tube was then placed, passing without difficulty.  Balloon was inflated.  Gastric contents began coming up through the tubing.  Dressings were placed around the tube as the patient does have significant excoriations.  X-ray was performed which confirmed good placement of the G-tube.  Patient will be discharged with instructions to follow-up with wound care for the excoriations around her G-tube as well as to follow-up with her PCP within the next few days.  Return to the emergency department for  any new or worsening symptoms.      FINAL CLINICAL IMPRESSION(S) / ED DIAGNOSES   Final diagnoses:  Malfunction of gastrostomy tube (HCC)     Rx / DC Orders   ED Discharge Orders     None        Note:  This document was prepared using Dragon voice recognition software and may include unintentional dictation errors.   Rexford Reche HERO, MD 05/16/24 1535  "

## 2024-05-21 ENCOUNTER — Inpatient Hospital Stay: Admitting: Pharmacist

## 2024-05-21 ENCOUNTER — Other Ambulatory Visit: Payer: Self-pay

## 2024-05-21 DIAGNOSIS — R52 Pain, unspecified: Secondary | ICD-10-CM

## 2024-05-21 DIAGNOSIS — C3491 Malignant neoplasm of unspecified part of right bronchus or lung: Secondary | ICD-10-CM

## 2024-05-21 DIAGNOSIS — L03019 Cellulitis of unspecified finger: Secondary | ICD-10-CM

## 2024-05-21 MED ORDER — OXYCODONE HCL 5 MG/5ML PO SOLN: 5.0000 mg | Freq: Three times a day (TID) | ORAL | 0 refills | Status: AC | PRN

## 2024-05-21 MED ORDER — MUPIROCIN 2 % EX OINT: 1.0000 | TOPICAL_OINTMENT | Freq: Two times a day (BID) | CUTANEOUS | 0 refills | Status: AC

## 2024-05-21 NOTE — Addendum Note (Signed)
 Addended by: RUBBIE PACIFIC B on: 05/21/2024 10:32 AM   Modules accepted: Orders

## 2024-05-21 NOTE — Progress Notes (Signed)
 "  Clinical Pharmacist Practitioner- Telephone Visit [facility]  [phone] [phone]    I connected with Denise Watts on 05/21/2024 at 11:00 AM EST by telephone and verified that I am speaking with the correct person using two identifiers.  Name of the patient: Denise Watts  [id number]  [birth year]   Location: Patient: Home Provider: Office   I discussed the limitations, risks, security and privacy concerns of performing an evaluation and management service by telephone and the availability of in person appointments. I also discussed with the patient that there may be a patient responsible charge related to this service. The patient expressed understanding and agreed to proceed.  HPI: Patient is a 69 y.o. female with metastatic, EGFR mutation-positive (G719S and V769M) NSCLC.  Patient started treatment with afatinib  30 mg in October 2025.  Afatinib  was held on 05/15/2024 due to rash, acneform rash, paronychia.  Reason for Consult: Oral chemotherapy follow-up for afatinib  therapy.   PAST MEDICAL HISTORY: Past Medical History:  Diagnosis Date   Acute pulmonary embolism (HCC) 05/10/2023   Adenomatous colon polyp    Adrenal carcinoma, right (HCC) 12/28/2016   a.) high grade carcinoma with extensive necrosis --> s/p adrenalectomy   Anxiety    Aortic atherosclerosis    Arthritis of right knee    Atrial fibrillation and flutter (HCC)    a.) CHA2DS2-VASc = 3 (age, sex, vascular disease history) as of 02/13/2023; b.) cardiac rate/rhythm maintained intrinsically without pharmacological intervention; no chronic OAC (does take low dose ASA)   B12 deficiency    Breast cancer (HCC) 1997   a.) stage IB (cT1a, cN0, cM0, G3, ER+, PR-, HER2-); s/p lumpectomy + XRT   COPD (chronic obstructive pulmonary disease) (HCC)    Coronary artery disease    a.) cCTA 12/09/2021: Ca2+ = 135 (80th %'ile for age/sex/race match contol) --> 25-49% pLAD and RCA distributions    Depression    Diastolic dysfunction    a.) TTE 12/11/2020, no RWMAs, norm RVSF, G1DD; b.) TTE 12/14/2021: EF 55-60, no RWMAs, norm RVSF, G1DD; c.) TTE 01/27/2023: EF 50-55%, no RWMAs, G1DD, mild RAE, mild MR, mod TR   Dry cough 03/02/2017   Dyspnea    Full dentures    GERD (gastroesophageal reflux disease)    History of hiatal hernia    HLD (hyperlipidemia)    Invasive carcinoma of breast (HCC) 11/15/2017   a.) path (+) for G3, ER -, PR -, HER2/neu - ; s/p lumpectomy 01/17/2018 + 4 cycles TC systemic chemotherapy + XRT   Long term current use of aspirin     Personal history of chemotherapy    Personal history of radiation therapy    Pneumothorax of left lung after biopsy 01/25/2023   a.) hypoxic in PACU requiring re-intubation --> transferred to ICU --> chest tube placed by PCCM   RLS (restless legs syndrome)    a.) on ropinirole    Skin cancer    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 Carcinoma of breast, T1, N0, M0 tumor diagnosis.  In January of 1997 2. Carcinoma of the vocal cord T1, N0, M0 tumor.  Status postradiation therapy 3. Abnormal CT scan of the chest (December, 2015) 4. Repeat CT scan of chest shows stable nodule (July, 2016) 5. Neutropenia with normal hemoglobin and normal platelet count (July, 2016)   Cancer of vocal cord [facility]) (  Resolved)  11/10/2014 Initial Diagnosis   Cancer of vocal cord   Malignant neoplasm of lower-outer quadrant of right breast of female, estrogen receptor negative (HCC)  11/24/2017 Initial Diagnosis   Malignant neoplasm of lower-outer quadrant of right breast of female, estrogen receptor negative (HCC)   01/12/2018 Cancer Staging   Staging form: Breast, AJCC 8th Edition - Clinical stage from 01/12/2018: Stage IB (cT1a, cN0, cM0, G3, ER+, PR-, HER2-) - Signed by Melanee Annah BROCKS, MD on 01/12/2018   01/30/2018 -  04/26/2018 Chemotherapy   The patient had dexamethasone  (DECADRON ) 4 MG tablet, 8 mg, Oral, 2 times daily, 1 of 1 cycle, Start date: 01/12/2018, End date: 08/03/2018 palonosetron  (ALOXI ) injection 0.25 mg, 0.25 mg, Intravenous,  Once, 4 of 4 cycles Administration: 0.25 mg (01/30/2018), 0.25 mg (02/27/2018), 0.25 mg (03/28/2018), 0.25 mg (04/26/2018) pegfilgrastim  (NEULASTA  ONPRO KIT) injection 6 mg, 6 mg, Subcutaneous, Once, 4 of 4 cycles Administration: 6 mg (01/30/2018), 6 mg (02/27/2018), 6 mg (03/28/2018), 6 mg (04/26/2018) cyclophosphamide  (CYTOXAN ) 1,000 mg in sodium chloride  0.9 % 250 mL chemo infusion, 600 mg/m2 = 1,000 mg, Intravenous,  Once, 4 of 4 cycles Administration: 1,000 mg (01/30/2018), 1,000 mg (02/27/2018), 1,000 mg (03/28/2018), 1,000 mg (04/26/2018) DOCEtaxel  (TAXOTERE ) 130 mg in sodium chloride  0.9 % 250 mL chemo infusion, 75 mg/m2 = 130 mg, Intravenous,  Once, 4 of 4 cycles Dose modification: 60 mg/m2 (original dose 75 mg/m2, Cycle 2, Reason: Dose not tolerated) Administration: 130 mg (01/30/2018), 100 mg (02/27/2018), 100 mg (03/28/2018), 100 mg (04/26/2018)  for chemotherapy treatment.    Non-small cell lung cancer (HCC)  03/09/2023 Initial Diagnosis   Non-small cell lung cancer (HCC)   03/20/2023 -  Chemotherapy   Patient is on Treatment Plan : LUNG Carboplatin  + Paclitaxel  q7d     Primary malignant neoplasm of right lung metastatic to other site [facility])  01/22/2024 Initial Diagnosis   Primary malignant neoplasm of right lung metastatic to other site [facility])   01/22/2024 Cancer Staging   Staging form: Lung, AJCC V9 - Clinical stage from 01/22/2024: rcT4, rcN3, rpM1 - Signed by Melanee Annah BROCKS, MD on 01/22/2024 Stage prefix: Recurrence     ALLERGIES:  is allergic to sulfa  antibiotics.  MEDICATIONS:  Current Outpatient Medications  Medication Sig Dispense Refill   albuterol  (VENTOLIN  HFA) 108 (90 Base) MCG/ACT inhaler Inhale 2 puffs into the lungs every 6 (six) hours as  needed for wheezing or shortness of breath. 51 each 2   benzonatate  (TESSALON ) 100 MG capsule Take 2 capsules (200 mg total) by mouth 3 (three) times daily as needed for cough. (Patient not taking: Reported on 05/15/2024)     bisacodyl  (DULCOLAX) 10 MG suppository Place 1 suppository (10 mg total) rectally daily as needed for moderate constipation or severe constipation.     busPIRone  (BUSPAR ) 5 MG tablet Take 1 tablet (5 mg total) by mouth 3 (three) times daily as needed. 270 tablet 2   cyclobenzaprine  (FLEXERIL ) 5 MG tablet Take 5 mg by mouth 3 (three) times daily as needed for muscle spasms. (Patient not taking: Reported on 05/15/2024)     doxycycline  (VIBRA -TABS) 100 MG tablet Take 1 tablet (100 mg total) by mouth 2 (two) times daily for 14 days. Via peg 28 tablet 0   [START ON 05/30/2024] doxycycline  (VIBRA -TABS) 100 MG tablet Take 1 tablet (100 mg total) by mouth daily. Once 14 day dose completed start 100 mg tabs via peg daily for prophylactic treatment for rash 90 tablet 2   DULoxetine  (CYMBALTA )  60 MG capsule TAKE 1 CAPSULE BY MOUTH DAILY 90 capsule 0   fentaNYL  (DURAGESIC ) 12 MCG/HR Place 1 patch onto the skin every 3 (three) days. 10 patch 0   Fluticasone -Umeclidin-Vilant (TRELEGY ELLIPTA ) 100-62.5-25 MCG/ACT AEPB Inhale 1 Act into the lungs daily. 1 each 5   GILOTRIF  30 MG tablet TAKE 1 TABLET DAILY ON AN EMPTY STOMACH AT LEAST 1 HOUR BEFORE OR 2 HOURS AFTER MEALS 30 tablet 1   ipratropium-albuterol  (DUONEB) 0.5-2.5 (3) MG/3ML SOLN Take 3 mLs by nebulization every 4 (four) hours as needed.     lactulose , encephalopathy, (CHRONULAC ) 10 GM/15ML SOLN Place 30 mLs (20 g total) into feeding tube 2 (two) times daily. 946 mL 3   lidocaine  (LIDODERM ) 5 % Place 1 patch onto the skin daily. Remove & Discard patch within 12 hours or as directed by MD 30 patch 2   meloxicam  (MOBIC ) 7.5 MG tablet Take 1 tablet (7.5 mg total) by mouth daily. 90 tablet 1   montelukast  (SINGULAIR ) 10 MG tablet TAKE 1 TABLET  BY MOUTH AT BEDTIME 90 tablet 0   Nutritional Supplements (NUTREN 1.5) LIQD Give 1 carton 4 times a day (8am, noon, 4pm and 8pm). Flush with 60ml of water  before and after each feeding.  Give additional 1 1/2 cups of water  a day via tube or drink orally for adequate hydration.     omeprazole  (KONVOMEP ) 2 mg/mL SUSP oral suspension Take 20 mLs (40 mg total) by mouth daily. (Patient not taking: Reported on 05/15/2024) 240 mL 1   ondansetron  (ZOFRAN -ODT) 4 MG disintegrating tablet Take 1 tablet (4 mg total) by mouth every 8 (eight) hours as needed for nausea or vomiting. 20 tablet 0   oxyCODONE  (ROXICODONE ) 5 MG/5ML solution Take 5 mLs (5 mg total) by mouth every 8 (eight) hours as needed for severe pain (pain score 7-10). 200 mL 0   pregabalin (LYRICA) 25 MG capsule Take 1 capsule (25 mg total) by mouth 2 (two) times daily. 60 capsule 0   rOPINIRole  (REQUIP ) 0.5 MG tablet Take 1 tablet (0.5 mg total) by mouth 3 (three) times daily. 90 tablet 1   rosuvastatin  (CRESTOR ) 40 MG tablet PLACE 1 TABLET (40 MG TOTAL) INTO FEEDING TUBE DAILY 90 tablet 0   senna (SENOKOT) 8.6 MG TABS tablet Place 1 tablet (8.6 mg total) into feeding tube daily.     sucralfate  (CARAFATE ) 1 GM/10ML suspension PLACE 10 ML (1 GRAM TOTAL) INTO FEEDING TUBE 4 TIMES DAILY 1200 mL 2   tolterodine  (DETROL  LA) 4 MG 24 hr capsule Take 4 mg by mouth daily.     triamcinolone  ointment (KENALOG ) 0.5 % Apply 1 Application topically 2 (two) times daily for 14 days. Apply to arms, chest and legs for 14 days for itching 30 g 0   Water  For Irrigation, Sterile (FREE WATER ) SOLN Place 60 mLs into feeding tube 5 (five) times daily.     No current facility-administered medications for this visit.   Facility-Administered Medications Ordered in Other Visits  Medication Dose Route Frequency Provider Last Rate Last Admin   cyanocobalamin  (VITAMIN B12) injection 1,000 mcg  1,000 mcg Intramuscular Q30 days Rao, Archana C, MD   1,000 mcg at 03/16/22 1101     VITAL SIGNS: There were no vitals taken for this visit. There were no vitals filed for this visit.  Estimated body mass index is 19.84 kg/m as calculated from the following:   Height as of 05/13/24: 5' 1 (1.549 m).   Weight as of  05/15/24: 47.6 kg (105 lb).  LABS: CBC:    Component Value Date/Time   WBC 6.6 05/10/2024 1059   WBC 4.6 05/08/2024 1437   HGB 11.0 (L) 05/10/2024 1059   HGB 14.0 11/03/2021 1551   HCT 36.0 05/10/2024 1059   HCT 42.1 11/03/2021 1551   PLT 312 05/10/2024 1059   PLT 216 11/03/2021 1551   MCV 100.6 (H) 05/10/2024 1059   MCV 97 11/03/2021 1551   MCV 100 04/08/2014 0409   NEUTROABS 4.8 05/10/2024 1059   NEUTROABS 3.5 11/03/2021 1551   NEUTROABS 6.1 04/08/2014 0409   LYMPHSABS 0.9 05/10/2024 1059   LYMPHSABS 1.2 11/03/2021 1551   LYMPHSABS 0.8 (L) 04/08/2014 0409   MONOABS 0.6 05/10/2024 1059   MONOABS 0.7 04/08/2014 0409   EOSABS 0.3 05/10/2024 1059   EOSABS 0.2 11/03/2021 1551   EOSABS 0.0 04/08/2014 0409   BASOSABS 0.0 05/10/2024 1059   BASOSABS 0.0 11/03/2021 1551   BASOSABS 0.0 04/08/2014 0409   Comprehensive Metabolic Panel:    Component Value Date/Time   NA 141 05/15/2024 1441   NA 145 (H) 11/03/2021 1551   NA 143 04/08/2014 0409   K 4.0 05/15/2024 1441   K 3.5 04/08/2014 0409   CL 104 05/15/2024 1441   CL 113 (H) 04/08/2014 0409   CO2 27 05/15/2024 1441   CO2 26 04/08/2014 0409   BUN 23 05/15/2024 1441   BUN 13 11/03/2021 1551   BUN 6 (L) 04/08/2014 0409   CREATININE 0.65 05/15/2024 1441   CREATININE 0.74 04/16/2024 0850   CREATININE 0.67 04/08/2014 0409   GLUCOSE 95 05/15/2024 1441   GLUCOSE 84 04/08/2014 0409   CALCIUM  9.2 05/15/2024 1441   CALCIUM  7.5 (L) 04/08/2014 0409   AST 36 05/15/2024 1441   AST 45 (H) 04/16/2024 0850   ALT 17 05/15/2024 1441   ALT 42 04/16/2024 0850   ALT 21 11/26/2012 1019   ALKPHOS 84 05/15/2024 1441   ALKPHOS 87 11/26/2012 1019   BILITOT 0.2 05/15/2024 1441   BILITOT 0.4 04/16/2024 0850    PROT 6.3 (L) 05/15/2024 1441   PROT 6.3 11/03/2021 1551   PROT 6.4 11/26/2012 1019   ALBUMIN  3.5 05/15/2024 1441   ALBUMIN  4.1 11/03/2021 1551   ALBUMIN  3.5 11/26/2012 1019    On phone during today's visit: Patient only  Assessment and Plan-  Rash: Patient reports rash has improved all over her body since stopping afatinib , starting doxycycline , and topical steroid use.  She still has mild rash on her legs, but significantly improved since last week Patient will resume afatinib  30 mg daily Patient will continue doxycycline  as prescribed, she knows she will take twice daily for 14 days, then switch to once daily thereafter Patient will continue to use topical steroid as prescribed for 14 days then switch to as needed Paronychia: Still present, but improved.  Patient reports that 6 of her nailbeds are currently affected Instructed patient to begin warm water  soaks for 15-20 minutes 3-4 times a day.  She can and a tablespoon of salt or saline. Once dry, apply topical mupirocin  twice daily. Can apply bandage to keep the area clean and covered.  Patient is scheduled to see MD next week for in person follow-up.  She knows to contact the office if her rash or paronychia worsens with resuming afatinib .   Patient expressed understanding and was in agreement with this plan. She also understands that She can call clinic at any time with any questions, concerns, or complaints.  Follow-up plan: RTC next week as scheduled  Thank you for allowing me to participate in the care of this very pleasant patient.   Time Total: 15 mins of non face-to-face telephone visit time during this encounter  Visit consisted of counseling and education on dealing with issues of symptom management in the setting of serious and potentially life-threatening illness.Greater than 50%  of this time was spent counseling and coordinating care related to the above assessment and plan.   Neetu Carrozza N. Mekala Winger, PharmD, BCOP,  CPP Hematology/Oncology Clinical Pharmacist [facility] Oral Chemotherapy Navigation [facility] [phone]  05/21/2024 11:14 AM   "

## 2024-05-23 ENCOUNTER — Inpatient Hospital Stay

## 2024-05-23 NOTE — Progress Notes (Signed)
 Nutrition follow-up:  Patient on scheduled for phone call today.  No answer or option to leave voicemail as mailbox full.  Follow-up planned for 1/27  Talonda Artist B. Dasie SOLON, CSO, LDN Registered Dietitian [phone]

## 2024-05-24 ENCOUNTER — Encounter: Payer: Self-pay | Admitting: Oncology

## 2024-05-28 ENCOUNTER — Inpatient Hospital Stay: Admitting: Hospice and Palliative Medicine

## 2024-05-28 ENCOUNTER — Inpatient Hospital Stay: Admitting: Oncology

## 2024-05-28 ENCOUNTER — Inpatient Hospital Stay

## 2024-05-30 ENCOUNTER — Telehealth: Payer: Self-pay | Admitting: Oncology

## 2024-05-30 ENCOUNTER — Inpatient Hospital Stay: Admitting: Hospice and Palliative Medicine

## 2024-05-30 ENCOUNTER — Inpatient Hospital Stay

## 2024-05-30 ENCOUNTER — Inpatient Hospital Stay: Admitting: Oncology

## 2024-05-30 NOTE — Telephone Encounter (Signed)
 Pt called this morning and states her husband fell and she needs to reschedule her appts. Pt would like a callback to rs her appts. Appts have been cancelled.

## 2024-06-11 ENCOUNTER — Inpatient Hospital Stay: Admitting: Oncology

## 2024-06-11 ENCOUNTER — Inpatient Hospital Stay

## 2024-06-11 ENCOUNTER — Inpatient Hospital Stay: Admitting: Hospice and Palliative Medicine

## 2024-06-13 ENCOUNTER — Encounter: Admitting: Physician Assistant

## 2024-06-18 ENCOUNTER — Inpatient Hospital Stay

## 2024-06-25 ENCOUNTER — Encounter: Admitting: Physician Assistant

## 2024-07-09 ENCOUNTER — Encounter: Admitting: Physician Assistant

## 2024-12-11 ENCOUNTER — Ambulatory Visit
